# Patient Record
Sex: Female | Born: 1955
Health system: Southern US, Community
[De-identification: ages and names within clinical notes are randomized; demographics above are authoritative.]

## PROBLEM LIST (undated history)

## (undated) DIAGNOSIS — I495 Sick sinus syndrome: Secondary | ICD-10-CM

## (undated) DIAGNOSIS — R945 Abnormal results of liver function studies: Secondary | ICD-10-CM

## (undated) DIAGNOSIS — I480 Paroxysmal atrial fibrillation: Secondary | ICD-10-CM

## (undated) DIAGNOSIS — F329 Major depressive disorder, single episode, unspecified: Secondary | ICD-10-CM

## (undated) DIAGNOSIS — I219 Acute myocardial infarction, unspecified: Secondary | ICD-10-CM

## (undated) DIAGNOSIS — D649 Anemia, unspecified: Secondary | ICD-10-CM

## (undated) DIAGNOSIS — M069 Rheumatoid arthritis, unspecified: Secondary | ICD-10-CM

## (undated) DIAGNOSIS — I639 Cerebral infarction, unspecified: Secondary | ICD-10-CM

## (undated) DIAGNOSIS — R519 Headache, unspecified: Secondary | ICD-10-CM

## (undated) DIAGNOSIS — I739 Peripheral vascular disease, unspecified: Secondary | ICD-10-CM

## (undated) DIAGNOSIS — Z972 Presence of dental prosthetic device (complete) (partial): Secondary | ICD-10-CM

## (undated) DIAGNOSIS — Z973 Presence of spectacles and contact lenses: Secondary | ICD-10-CM

## (undated) DIAGNOSIS — R42 Dizziness and giddiness: Secondary | ICD-10-CM

## (undated) DIAGNOSIS — Z950 Presence of cardiac pacemaker: Secondary | ICD-10-CM

## (undated) DIAGNOSIS — M3500 Sicca syndrome, unspecified: Secondary | ICD-10-CM

## (undated) DIAGNOSIS — I2510 Atherosclerotic heart disease of native coronary artery without angina pectoris: Secondary | ICD-10-CM

## (undated) DIAGNOSIS — I5181 Takotsubo syndrome: Secondary | ICD-10-CM

## (undated) DIAGNOSIS — G935 Compression of brain: Secondary | ICD-10-CM

## (undated) DIAGNOSIS — I10 Essential (primary) hypertension: Secondary | ICD-10-CM

## (undated) DIAGNOSIS — M329 Systemic lupus erythematosus, unspecified: Secondary | ICD-10-CM

## (undated) DIAGNOSIS — F3289 Other specified depressive episodes: Secondary | ICD-10-CM

## (undated) DIAGNOSIS — E785 Hyperlipidemia, unspecified: Secondary | ICD-10-CM

## (undated) DIAGNOSIS — K219 Gastro-esophageal reflux disease without esophagitis: Secondary | ICD-10-CM

## (undated) DIAGNOSIS — F419 Anxiety disorder, unspecified: Secondary | ICD-10-CM

## (undated) DIAGNOSIS — R7989 Other specified abnormal findings of blood chemistry: Secondary | ICD-10-CM

## (undated) HISTORY — DX: Compression of brain: G93.5

## (undated) HISTORY — DX: Rheumatoid arthritis, unspecified: M06.9

## (undated) HISTORY — DX: Dizziness and giddiness: R42

## (undated) HISTORY — DX: Gastro-esophageal reflux disease without esophagitis: K21.9

## (undated) HISTORY — PX: TUBAL LIGATION: SHX77

## (undated) HISTORY — DX: Other specified depressive episodes: F32.89

## (undated) HISTORY — DX: Atherosclerotic heart disease of native coronary artery without angina pectoris: I25.10

## (undated) HISTORY — DX: Essential (primary) hypertension: I10

## (undated) HISTORY — PX: COLONOSCOPY: SHX174

## (undated) HISTORY — DX: Major depressive disorder, single episode, unspecified: F32.9

## (undated) HISTORY — DX: Systemic lupus erythematosus, unspecified: M32.9

## (undated) HISTORY — DX: Takotsubo syndrome: I51.81

## (undated) HISTORY — DX: Peripheral vascular disease, unspecified: I73.9

## (undated) HISTORY — DX: Sicca syndrome, unspecified: M35.00

## (undated) HISTORY — DX: Hyperlipidemia, unspecified: E78.5

## (undated) SURGERY — Surgical Case
Anesthesia: *Unknown

---
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
## 1998-03-31 ENCOUNTER — Ambulatory Visit: Admission: RE | Admit: 1998-03-31 | Discharge: 1998-03-31 | Payer: Self-pay | Admitting: Family Medicine

## 1998-04-06 ENCOUNTER — Encounter: Admission: RE | Admit: 1998-04-06 | Discharge: 1998-07-05 | Payer: Self-pay | Admitting: Family Medicine

## 1999-12-14 ENCOUNTER — Emergency Department (HOSPITAL_COMMUNITY): Admission: EM | Admit: 1999-12-14 | Discharge: 1999-12-14 | Payer: Self-pay | Admitting: Emergency Medicine

## 1999-12-14 ENCOUNTER — Encounter: Payer: Self-pay | Admitting: Emergency Medicine

## 2002-01-29 ENCOUNTER — Ambulatory Visit (HOSPITAL_COMMUNITY): Admission: RE | Admit: 2002-01-29 | Discharge: 2002-01-29 | Payer: Self-pay

## 2002-03-18 ENCOUNTER — Emergency Department (HOSPITAL_COMMUNITY): Admission: EM | Admit: 2002-03-18 | Discharge: 2002-03-18 | Payer: Self-pay | Admitting: Emergency Medicine

## 2002-03-18 ENCOUNTER — Encounter: Payer: Self-pay | Admitting: Emergency Medicine

## 2002-05-27 ENCOUNTER — Ambulatory Visit (HOSPITAL_COMMUNITY): Admission: RE | Admit: 2002-05-27 | Discharge: 2002-05-27 | Payer: Self-pay | Admitting: Family Medicine

## 2002-05-27 ENCOUNTER — Encounter: Payer: Self-pay | Admitting: Family Medicine

## 2003-02-12 ENCOUNTER — Emergency Department (HOSPITAL_COMMUNITY): Admission: EM | Admit: 2003-02-12 | Discharge: 2003-02-12 | Payer: Self-pay | Admitting: Emergency Medicine

## 2003-05-26 ENCOUNTER — Ambulatory Visit (HOSPITAL_COMMUNITY): Admission: RE | Admit: 2003-05-26 | Discharge: 2003-05-26 | Payer: Self-pay | Admitting: Obstetrics

## 2003-05-26 ENCOUNTER — Encounter: Payer: Self-pay | Admitting: Obstetrics

## 2003-11-30 ENCOUNTER — Emergency Department (HOSPITAL_COMMUNITY): Admission: EM | Admit: 2003-11-30 | Discharge: 2003-11-30 | Payer: Self-pay

## 2003-12-01 ENCOUNTER — Emergency Department (HOSPITAL_COMMUNITY): Admission: AD | Admit: 2003-12-01 | Discharge: 2003-12-01 | Payer: Self-pay | Admitting: Family Medicine

## 2003-12-01 ENCOUNTER — Emergency Department (HOSPITAL_COMMUNITY): Admission: EM | Admit: 2003-12-01 | Discharge: 2003-12-01 | Payer: Self-pay

## 2004-07-14 ENCOUNTER — Ambulatory Visit: Payer: Self-pay | Admitting: Internal Medicine

## 2005-12-25 ENCOUNTER — Emergency Department (HOSPITAL_COMMUNITY): Admission: EM | Admit: 2005-12-25 | Discharge: 2005-12-25 | Payer: Self-pay | Admitting: Family Medicine

## 2005-12-26 ENCOUNTER — Emergency Department (HOSPITAL_COMMUNITY): Admission: EM | Admit: 2005-12-26 | Discharge: 2005-12-26 | Payer: Self-pay | Admitting: Family Medicine

## 2008-12-18 ENCOUNTER — Encounter: Payer: Self-pay | Admitting: Physician Assistant

## 2009-07-28 DIAGNOSIS — I5181 Takotsubo syndrome: Secondary | ICD-10-CM

## 2009-07-28 HISTORY — DX: Takotsubo syndrome: I51.81

## 2009-08-16 ENCOUNTER — Ambulatory Visit: Payer: Self-pay | Admitting: Cardiovascular Disease

## 2009-08-16 ENCOUNTER — Inpatient Hospital Stay (HOSPITAL_COMMUNITY): Admission: EM | Admit: 2009-08-16 | Discharge: 2009-08-18 | Payer: Self-pay | Admitting: Emergency Medicine

## 2009-08-16 ENCOUNTER — Encounter: Payer: Self-pay | Admitting: Cardiology

## 2009-08-17 LAB — CONVERTED CEMR LAB
Hemoglobin: 12.8 g/dL
TSH: 0.927 microintl units/mL

## 2009-08-26 ENCOUNTER — Ambulatory Visit: Payer: Self-pay | Admitting: Physician Assistant

## 2009-08-26 DIAGNOSIS — I2510 Atherosclerotic heart disease of native coronary artery without angina pectoris: Secondary | ICD-10-CM | POA: Insufficient documentation

## 2009-08-26 DIAGNOSIS — E1169 Type 2 diabetes mellitus with other specified complication: Secondary | ICD-10-CM | POA: Insufficient documentation

## 2009-08-26 DIAGNOSIS — I429 Cardiomyopathy, unspecified: Secondary | ICD-10-CM

## 2009-08-26 DIAGNOSIS — M069 Rheumatoid arthritis, unspecified: Secondary | ICD-10-CM | POA: Insufficient documentation

## 2009-08-26 DIAGNOSIS — E785 Hyperlipidemia, unspecified: Secondary | ICD-10-CM

## 2009-08-26 DIAGNOSIS — I10 Essential (primary) hypertension: Secondary | ICD-10-CM

## 2009-08-26 DIAGNOSIS — I152 Hypertension secondary to endocrine disorders: Secondary | ICD-10-CM | POA: Insufficient documentation

## 2009-08-26 DIAGNOSIS — K219 Gastro-esophageal reflux disease without esophagitis: Secondary | ICD-10-CM

## 2009-08-31 ENCOUNTER — Encounter: Payer: Self-pay | Admitting: Physician Assistant

## 2009-08-31 LAB — CONVERTED CEMR LAB
ALT: 17 units/L (ref 0–35)
Albumin: 4.3 g/dL (ref 3.5–5.2)
Amphetamine Screen, Ur: NEGATIVE
Benzodiazepines.: NEGATIVE
Cocaine Metabolites: NEGATIVE
Creatinine, Ser: 0.9 mg/dL (ref 0.40–1.20)
Marijuana Metabolite: NEGATIVE
Methadone: NEGATIVE
Opiate Screen, Urine: NEGATIVE
Phencyclidine (PCP): NEGATIVE
Potassium: 5.2 meq/L (ref 3.5–5.3)
Propoxyphene: NEGATIVE
Sodium: 142 meq/L (ref 135–145)

## 2009-09-09 ENCOUNTER — Ambulatory Visit: Payer: Self-pay | Admitting: Physician Assistant

## 2009-09-10 ENCOUNTER — Encounter: Payer: Self-pay | Admitting: Physician Assistant

## 2009-09-10 LAB — CONVERTED CEMR LAB
Creatinine, Ser: 1.11 mg/dL (ref 0.40–1.20)
Sed Rate: 2 mm/hr (ref 0–22)
Sodium: 142 meq/L (ref 135–145)

## 2009-09-17 ENCOUNTER — Ambulatory Visit: Payer: Self-pay | Admitting: Cardiology

## 2009-09-17 DIAGNOSIS — F17200 Nicotine dependence, unspecified, uncomplicated: Secondary | ICD-10-CM | POA: Insufficient documentation

## 2009-09-20 ENCOUNTER — Encounter: Payer: Self-pay | Admitting: Physician Assistant

## 2009-09-22 ENCOUNTER — Telehealth (INDEPENDENT_AMBULATORY_CARE_PROVIDER_SITE_OTHER): Payer: Self-pay | Admitting: *Deleted

## 2009-10-11 ENCOUNTER — Ambulatory Visit: Payer: Self-pay | Admitting: Physician Assistant

## 2009-10-12 LAB — CONVERTED CEMR LAB
Albumin: 4.4 g/dL (ref 3.5–5.2)
BUN: 11 mg/dL (ref 6–23)
CO2: 24 meq/L (ref 19–32)
Glucose, Bld: 75 mg/dL (ref 70–99)
Potassium: 4.4 meq/L (ref 3.5–5.3)
Sodium: 141 meq/L (ref 135–145)
Total Bilirubin: 0.4 mg/dL (ref 0.3–1.2)

## 2009-10-13 ENCOUNTER — Ambulatory Visit: Payer: Self-pay

## 2009-10-13 ENCOUNTER — Ambulatory Visit: Payer: Self-pay | Admitting: Cardiology

## 2009-10-13 ENCOUNTER — Encounter: Payer: Self-pay | Admitting: Physician Assistant

## 2009-10-13 ENCOUNTER — Ambulatory Visit (HOSPITAL_COMMUNITY): Admission: RE | Admit: 2009-10-13 | Discharge: 2009-10-13 | Payer: Self-pay | Admitting: Cardiology

## 2009-10-14 LAB — CONVERTED CEMR LAB
ALT: 33 units/L (ref 0–35)
Alkaline Phosphatase: 60 units/L (ref 39–117)
Chloride: 107 meq/L (ref 96–112)
HDL: 84.3 mg/dL (ref >39.00)
LDL Cholesterol: 55 mg/dL (ref 0–99)
Sodium: 141 meq/L (ref 135–145)
Total Bilirubin: 0.5 mg/dL (ref 0.3–1.2)
Total Protein: 6.5 g/dL (ref 6.0–8.3)

## 2009-10-18 ENCOUNTER — Ambulatory Visit: Payer: Self-pay | Admitting: Cardiology

## 2009-10-18 DIAGNOSIS — E876 Hypokalemia: Secondary | ICD-10-CM

## 2009-10-19 ENCOUNTER — Encounter (INDEPENDENT_AMBULATORY_CARE_PROVIDER_SITE_OTHER): Payer: Self-pay | Admitting: Internal Medicine

## 2009-10-19 ENCOUNTER — Ambulatory Visit (HOSPITAL_COMMUNITY): Admission: RE | Admit: 2009-10-19 | Discharge: 2009-10-19 | Payer: Self-pay | Admitting: Internal Medicine

## 2009-10-19 ENCOUNTER — Encounter: Payer: Self-pay | Admitting: Physician Assistant

## 2009-10-19 ENCOUNTER — Ambulatory Visit: Payer: Self-pay | Admitting: Surgery

## 2009-10-24 ENCOUNTER — Encounter: Payer: Self-pay | Admitting: Physician Assistant

## 2009-10-29 DIAGNOSIS — I739 Peripheral vascular disease, unspecified: Secondary | ICD-10-CM

## 2009-11-02 ENCOUNTER — Ambulatory Visit: Payer: Self-pay | Admitting: Physician Assistant

## 2009-11-02 DIAGNOSIS — F329 Major depressive disorder, single episode, unspecified: Secondary | ICD-10-CM

## 2009-11-03 ENCOUNTER — Encounter: Payer: Self-pay | Admitting: Physician Assistant

## 2009-11-03 LAB — CONVERTED CEMR LAB
BUN: 13 mg/dL (ref 6–23)
CO2: 26 meq/L (ref 19–32)
Creatinine, Ser: 0.99 mg/dL (ref 0.40–1.20)
Glucose, Bld: 63 mg/dL — ABNORMAL LOW (ref 70–99)

## 2009-11-19 ENCOUNTER — Ambulatory Visit: Payer: Self-pay | Admitting: Physician Assistant

## 2009-11-19 ENCOUNTER — Telehealth: Payer: Self-pay | Admitting: Physician Assistant

## 2009-11-23 ENCOUNTER — Ambulatory Visit: Payer: Self-pay | Admitting: Cardiovascular Disease

## 2009-11-25 ENCOUNTER — Encounter: Payer: Self-pay | Admitting: Cardiovascular Disease

## 2009-12-10 ENCOUNTER — Ambulatory Visit: Payer: Self-pay | Admitting: Internal Medicine

## 2009-12-10 ENCOUNTER — Telehealth: Payer: Self-pay | Admitting: Cardiovascular Disease

## 2009-12-14 ENCOUNTER — Ambulatory Visit: Payer: Self-pay | Admitting: Physician Assistant

## 2009-12-21 ENCOUNTER — Ambulatory Visit: Payer: Self-pay | Admitting: Physician Assistant

## 2009-12-31 ENCOUNTER — Encounter: Payer: Self-pay | Admitting: Physician Assistant

## 2010-01-07 ENCOUNTER — Encounter: Payer: Self-pay | Admitting: Physician Assistant

## 2010-01-17 ENCOUNTER — Encounter: Payer: Self-pay | Admitting: Physician Assistant

## 2010-02-18 ENCOUNTER — Emergency Department (HOSPITAL_COMMUNITY): Admission: EM | Admit: 2010-02-18 | Discharge: 2010-02-18 | Payer: Self-pay | Admitting: Emergency Medicine

## 2010-02-21 ENCOUNTER — Ambulatory Visit: Payer: Self-pay | Admitting: Physician Assistant

## 2010-02-24 ENCOUNTER — Encounter (INDEPENDENT_AMBULATORY_CARE_PROVIDER_SITE_OTHER): Payer: Self-pay | Admitting: Internal Medicine

## 2010-02-24 ENCOUNTER — Ambulatory Visit (HOSPITAL_COMMUNITY): Admission: RE | Admit: 2010-02-24 | Discharge: 2010-02-24 | Payer: Self-pay | Admitting: Internal Medicine

## 2010-02-24 ENCOUNTER — Encounter: Payer: Self-pay | Admitting: Physician Assistant

## 2010-02-24 ENCOUNTER — Ambulatory Visit: Payer: Self-pay | Admitting: Vascular Surgery

## 2010-03-03 ENCOUNTER — Telehealth: Payer: Self-pay | Admitting: Physician Assistant

## 2010-03-07 ENCOUNTER — Encounter: Admission: RE | Admit: 2010-03-07 | Discharge: 2010-05-27 | Payer: Self-pay | Admitting: Internal Medicine

## 2010-03-07 ENCOUNTER — Encounter: Payer: Self-pay | Admitting: Physician Assistant

## 2010-03-09 ENCOUNTER — Ambulatory Visit: Payer: Self-pay | Admitting: Physician Assistant

## 2010-03-09 ENCOUNTER — Telehealth: Payer: Self-pay | Admitting: Physician Assistant

## 2010-03-09 DIAGNOSIS — I495 Sick sinus syndrome: Secondary | ICD-10-CM | POA: Insufficient documentation

## 2010-03-10 ENCOUNTER — Telehealth: Payer: Self-pay | Admitting: Physician Assistant

## 2010-03-16 ENCOUNTER — Ambulatory Visit: Payer: Self-pay | Admitting: Physician Assistant

## 2010-03-17 ENCOUNTER — Telehealth: Payer: Self-pay | Admitting: Physician Assistant

## 2010-03-17 ENCOUNTER — Encounter: Payer: Self-pay | Admitting: Physician Assistant

## 2010-03-17 ENCOUNTER — Ambulatory Visit (HOSPITAL_COMMUNITY): Admission: RE | Admit: 2010-03-17 | Discharge: 2010-03-17 | Payer: Self-pay | Admitting: Internal Medicine

## 2010-03-18 ENCOUNTER — Ambulatory Visit: Payer: Self-pay | Admitting: Cardiology

## 2010-03-22 ENCOUNTER — Telehealth: Payer: Self-pay | Admitting: Physician Assistant

## 2010-03-22 DIAGNOSIS — Q054 Unspecified spina bifida with hydrocephalus: Secondary | ICD-10-CM | POA: Insufficient documentation

## 2010-04-06 ENCOUNTER — Ambulatory Visit: Payer: Self-pay | Admitting: Physician Assistant

## 2010-04-06 LAB — CONVERTED CEMR LAB
BUN: 11 mg/dL (ref 6–23)
Glucose, Bld: 110 mg/dL — ABNORMAL HIGH (ref 70–99)
Potassium: 5.2 meq/L (ref 3.5–5.3)
Sodium: 141 meq/L (ref 135–145)

## 2010-04-07 ENCOUNTER — Encounter: Payer: Self-pay | Admitting: Physician Assistant

## 2010-05-03 ENCOUNTER — Telehealth (INDEPENDENT_AMBULATORY_CARE_PROVIDER_SITE_OTHER): Payer: Self-pay | Admitting: Cardiology

## 2010-05-27 ENCOUNTER — Telehealth: Payer: Self-pay | Admitting: Physician Assistant

## 2010-06-08 ENCOUNTER — Ambulatory Visit: Payer: Self-pay | Admitting: Physician Assistant

## 2010-06-08 LAB — CONVERTED CEMR LAB: Rapid Strep: NEGATIVE

## 2010-09-18 ENCOUNTER — Encounter: Payer: Self-pay | Admitting: Internal Medicine

## 2010-09-19 ENCOUNTER — Encounter: Payer: Self-pay | Admitting: Internal Medicine

## 2010-09-27 NOTE — Progress Notes (Signed)
Summary: Peripheral Vascular Angiogram procedure  Phone Note Call from Patient Call back at Home Phone [phone]   Caller: Patient Reason for Call: Talk to Nurse Summary of Call: pt having Peripheral Vascular Angiogram, does not have anyone to stay with her after procedure, can she stay in the hospital? Initial call taken by: Migdalia Dk,  December 10, 2009 4:02 PM  Follow-up for Phone Call        Spoke with Mercedes [name] about concerns over upcoming pv angiogram.  She does not have anyone to drive her to the hospital for her procedure or stay with her for 24 hours. Her mother passed on Nov 22, 2009 and she said her family has no time off at this time to take her or stay with her.  She lives alone.  I have told her [name] and Dennie Bible will be in the office Tuesday 12/14/09 and be able to make recommendations then about this concern. She agrees and will still come in Monday 12/13/09 for her lab work. Follow-up by: Lisabeth Devoid RN,  December 10, 2009 4:25 PM     Appended Document: Peripheral Vascular Angiogram procedure Pat, Can we touch base with her. We can keep her overnight if she cannot get a ride or we could delay the procedure until a time when she has someone available to drive her home. I would not want her driving herself home even if we kept her overnight. cdm  Appended Document: Peripheral Vascular Angiogram procedure left message to call back  Appended Document: Peripheral Vascular Angiogram procedure left message on cell phone to call back(previous message left on home phone)  Appended Document: Peripheral Vascular Angiogram procedure Spoke with pt who states she has many family issues at present time and has no one who can drive her home from hospital. She would like to cancel PV procedure for tomorrow.  Cath lab and Dr. [name] notified.  Appended Document: Peripheral Vascular Angiogram procedure Spoke with pt. She has many questions regarding possible treatment options  and would like to keep appt. with Dr. [name] on May 12 to discuss.
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
## 2010-09-27 NOTE — Progress Notes (Signed)
Summary: MRI and CT scan test results//gk  Phone Note Call from Patient   Summary of Call: The pt went on Thursday to the [facility], and she  had done a CT scan and MRI test and  is wondering if the provider received the results, if so can someone can call her back?Alben Spittle PA-c Initial call taken by: Manon Hilding,  March 22, 2010 11:29 AM  Follow-up for Phone Call        forward to provider Follow-up by: Armenia Shannon,  March 22, 2010 12:01 PM  Additional Follow-up for Phone Call Additional follow up Details #1::        I spoke with patient  Additional Follow-up by: Brynda Rim,  March 22, 2010 5:52 PM

## 2010-09-27 NOTE — Letter (Signed)
Summary: *HSN Results Follow up  [facility]  [address] [place], [place] [address]   Phone: [phone]  Fax: [phone]      04/07/2010   Mercedes [name] [address] [place], [place]  [address]   Dear  [name],                            ____S.Drinkard,FNP   ____D. Gore,FNP       ____B. McPherson,MD   ____V. Rankins,MD    ____E. Mulberry,MD    ____N. Daphine Deutscher, FNP  ____D. Reche Dixon, MD    ____K. Philipp Deputy, MD    __x__S. Alben Spittle, PA-C     This letter is to inform you that your recent test(s):  _______Pap Smear    ___x____Lab Test     _______X-ray    ___x____ is within acceptable limits  _______ requires a medication change  _______ requires a follow-up lab visit  _______ requires a follow-up visit with your provider   Comments: Kidney function and potassium ok.       _________________________________________________________ If you have any questions, please contact our office                     Sincerely,  [name] PA-C [facility]

## 2010-09-27 NOTE — Miscellaneous (Signed)
  Clinical Lists Changes  Observations: Added new observation of PAST MED HX: Current Problems:  HYPERTENSION (ICD-401.9) HYPERLIPIDEMIA (ICD-272.4) Takotsubo cardiomyopathy CORONARY ATHEROSCLEROSIS NATIVE CORONARY ARTERY (ICD-414.01) CAD   a.  NSTEMI 07/2009: min. Nonobs CAD (LAD irreg; 40% ostial D2 and no CFX or RCA disease)   b.  EF 50% by cath with apical AK   c.  Tako-Tsubo CM   d.  f/u Echo 09/2009: EF 50-55%; mild LVH; mod diast. dysfxn; mild apical HK RHEUMATOID ARTHRITIS (ICD-714.0) a.  previously with Dr. [name]  GERD (ICD-530.81) Postmenopausal  (10/24/2009 21:54)       Past History:  Past Medical History: Current Problems:  HYPERTENSION (ICD-401.9) HYPERLIPIDEMIA (ICD-272.4) Takotsubo cardiomyopathy CORONARY ATHEROSCLEROSIS NATIVE CORONARY ARTERY (ICD-414.01) CAD   a.  NSTEMI 07/2009: min. Nonobs CAD (LAD irreg; 40% ostial D2 and no CFX or RCA disease)   b.  EF 50% by cath with apical AK   c.  Tako-Tsubo CM   d.  f/u Echo 09/2009: EF 50-55%; mild LVH; mod diast. dysfxn; mild apical HK RHEUMATOID ARTHRITIS (ICD-714.0) a.  previously with Dr. [name]  GERD (ICD-530.81) Postmenopausal

## 2010-09-27 NOTE — Assessment & Plan Note (Signed)
Summary: post cath per chris b/lg/ insurance is pending / gd   CC:  pt says she stopped her benicar due to payment.  History of Present Illness: Mercedes [name] recently admitted to [facility] with complaints of chest pain. Her enzymes were positive. A cardiac catheterization was performed on August 16, 2009. There was a 40% LAD but no obstructive disease was noted. The ejection fraction was 50% and there is apical ballooning. This was felt consistent with takotsubo cardiomyopathy. Note the renal arteries were patent. Since discharge she has dyspnea with more extreme activities but not with routine activities. It is relieved with rest. There is no associated chest pain. There is no orthopnea, PND, pedal edema, palpitations, syncope or exertional chest pain.  Current Medications (verified): 1)  Carvedilol 6.25 Mg Tabs (Carvedilol) .... Take 1 Tablet By Mouth Two Times A Day 2)  Simvastatin 40 Mg Tabs (Simvastatin) .... One Tab By Mouth Everyday 3)  Cvs Aspirin 325 Mg Tabs (Aspirin) .... One Daily 4)  Hydrochlorothiazide 25 Mg Tabs (Hydrochlorothiazide) .... 1/2 Tab By Mouth Once Daily  Allergies: No Known Drug Allergies  Past History:  Past Medical History: Current Problems:  HYPERTENSION (ICD-401.9) HYPERLIPIDEMIA (ICD-272.4) Takotsubo cardiomyopathy CORONARY ATHEROSCLEROSIS NATIVE CORONARY ARTERY (ICD-414.01) CAD   a.  NSTEMI 07/2009: min. Nonobs CAD (LAD irreg; 40% ostial D2 and no CFX or RCA disease)   b.  EF 50% by cath with apical AK   c.  Tako-Tsubo CM RHEUMATOID ARTHRITIS (ICD-714.0) a.  previously with Dr. [name]  GERD (ICD-530.81) Postmenopausal  Social History: Reviewed history from 08/26/2009 and no changes required. Occupation: unemployed; housekeeping Single 3 kids Patient is smoking 3 cigarettes per day. Alcohol use-yes Drug use-yes  Review of Systems       no fevers or chills, productive cough, hemoptysis, dysphasia, odynophagia, melena, hematochezia,  dysuria, hematuria, rash, seizure activity, orthopnea, PND, pedal edema, claudication. Remaining systems are negative.   Vital Signs:  Patient profile:   55 year old [name] Menstrual status:  postmenopausal Height:      66 inches Weight:      123 pounds BMI:     19.92 Pulse rate:   62 / minute Resp:     18 per minute BP sitting:   170 / 82  (left arm)  Vitals Entered By: [name] (September 17, 2009 3:15 PM)  Physical Exam  General:  Well-developed well-nourished in no acute distress.  Skin is warm and dry.  HEENT is normal.  Neck is supple. No thyromegaly.  Chest is clear to auscultation with normal expansion.  Cardiovascular exam is regular rate and rhythm.  Abdominal exam nontender or distended. No masses palpated. Extremities show no edema. neuro grossly intact    EKG  Procedure date:  09/17/2009  Findings:      Sinus rhythm at a rate of 62. Left anterior fascicular block. Inferior and anterior T-wave changes.  Impression & Recommendations:  Problem # 1:  TOBACCO ABUSE (ICD-305.1) Patient counseled on discontinuing for between 3-10 minutes.  Problem # 2:  HYPERTENSION (ICD-401.9) Blood pressure elevated. Increase Coreg to 12.5 mg p.o. b.i.d. The following medications were removed from the medication list:    Benicar 40 Mg Tabs (Olmesartan medoxomil) ..... Once daily Her updated medication list for this problem includes:    Carvedilol 12.5 Mg Tabs (Carvedilol) .[name]... Take one tablet by mouth twice a day    Cvs Aspirin 325 Mg Tabs (Aspirin) ..... One daily    Hydrochlorothiazide 25 Mg Tabs (Hydrochlorothiazide) .Marland KitchenMarland KitchenMarland KitchenMarland Kitchen  1/2 tab by mouth once daily  Problem # 3:  HYPERLIPIDEMIA (ICD-272.4) Continue statin. Check lipids and liver. Given diuretic use we'll also check bmet. Her updated medication list for this problem includes:    Simvastatin 40 Mg Tabs (Simvastatin) ..... One tab by mouth everyday  Problem # 4:  CARDIOMYOPATHY, SECONDARY (ICD-425.9) Patient  with takotsubo cardiomyopathy; repeat echo in 4 weeks to see if LV function has normalized. The following medications were removed from the medication list:    Benicar 40 Mg Tabs (Olmesartan medoxomil) ..... Once daily Her updated medication list for this problem includes:    Carvedilol 12.5 Mg Tabs (Carvedilol) .[name]... Take one tablet by mouth twice a day    Cvs Aspirin 325 Mg Tabs (Aspirin) ..... One daily    Hydrochlorothiazide 25 Mg Tabs (Hydrochlorothiazide) .[name]... 1/2 tab by mouth once daily  Orders: Echocardiogram (Echo)  Problem # 5:  CORONARY ATHEROSCLEROSIS NATIVE CORONARY ARTERY (ICD-414.01) Minor nonobstructive disease on recent catheterization. Continue aspirin, beta blocker and statin. Her updated medication list for this problem includes:    Carvedilol 12.5 Mg Tabs (Carvedilol) .[name]... Take one tablet by mouth twice a day    Cvs Aspirin 325 Mg Tabs (Aspirin) ..... One daily  Problem # 6:  RHEUMATOID ARTHRITIS (ICD-714.0)  Problem # 7:  GERD (ICD-530.81)  Patient Instructions: 1)  Your physician recommends that you schedule a follow-up appointment in: 6 MONTHS 2)  Your physician recommends that you return for lab work in:4 WEEKS-BMP/LIPID/LIVER-272.0/V58.69/401.1 3)  Your physician has recommended you make the following change in your medication: INCREASE CARVEDALOL 12.5MG  ONE TABLET TWICE DAILY 4)  Your physician has requested that you have an echocardiogram.  Echocardiography is a painless test that uses sound waves to create images of your heart. It provides your doctor with information about the size and shape of your heart and how well your heart's chambers and valves are working.  This procedure takes approximately one hour. There are no restrictions for this procedure.SCHEDULE IN 4 WEEKS Prescriptions: CARVEDILOL 12.5 MG TABS (CARVEDILOL) Take one tablet by mouth twice a day  #60 x 12   Entered by:   Deliah Goody, RN   Authorized by:   Ferman Hamming, MD, [facility]    Signed by:   Deliah Goody, RN on 09/17/2009   Method used:   Electronically to        [facility] [phone]* (retail)       [address]       [place], [place]  [address]       Ph: [phone]       Fax: [phone]   RxID:   [id number]

## 2010-09-27 NOTE — Assessment & Plan Note (Signed)
Summary: follow up in 6 weeks with Mercedes [name] for bp//gk   Vital Signs:  Patient profile:   55 year old female Menstrual status:  postmenopausal Height:      66 inches Weight:      130 pounds BMI:     21.06 Temp:     98.1 degrees F oral Pulse rate:   59 / minute Pulse rhythm:   regular Resp:     18 per minute BP sitting:   163 / 87  (left arm) Cuff size:   regular  Vitals Entered By: Armenia Shannon (October 11, 2009 10:35 AM) CC: f/u on bp.... Is Patient Diabetic? No Pain Assessment Patient in pain? no       Does patient need assistance? Functional Status Self care Ambulation Normal   CC:  f/u on bp.....  History of Present Illness: 55 year old female presents for followup on her blood pressure.  Since last being seen, she ran out of her hydrochlorothiazide.  She saw the cardiologist and he increased her carvedilol.  She is no longer on hydrochlorothiazide.  Her blood pressures remain elevated.  She continues to have significant symptoms of dyspnea with exertion.  She notes NYHA class II to class IIb symptoms.  She sleeps on one pillow.  She denies any true paroxysmal nocturnal dyspnea.  She is concerned about weight gain but has had no specific edema.  She denies any cough.  She continues to smoke cigarettes.  She is complaining of significant leg pain.  It seems to come on with exertion.  She also notes this at night.  She also notes shoulder pain.  She did not have any symptoms until she came out of the hospital.  Of note, simvastatin is a new medication for her.  When I last saw her, she noted that her rheumatoid arthritis was in remission.  I checked a sed rate.  This was normal.  She still denies any symptoms of synovitis in her hands.  However, she now notes that she has occasional pain in knees and  her shoulder.  She tells me that this was a symptom that she had in the past with her rheumatoid arthritis.  Problems Prior to Update: 1)  Dyspnea  (ICD-786.05) 2)  Leg Pain   (ICD-729.5) 3)  Tobacco Abuse  (ICD-305.1) 4)  Hypertension  (ICD-401.9) 5)  Hyperlipidemia  (ICD-272.4) 6)  Chest Pain  (ICD-786.50) 7)  Preventive Health Care  (ICD-V70.0) 8)  Cardiomyopathy, Secondary  (ICD-425.9) 9)  Coronary Atherosclerosis Native Coronary Artery  (ICD-414.01) 10)  Family History Diabetes 1st Degree Relative  (ICD-V18.0) 11)  Rheumatoid Arthritis  (ICD-714.0) 12)  Gerd  (ICD-530.81)  Current Medications (verified): 1)  Carvedilol 12.5 Mg Tabs (Carvedilol) .... Take One Tablet By Mouth Twice A Day 2)  Simvastatin 40 Mg Tabs (Simvastatin) .... One Tab By Mouth Everyday 3)  [facility]vs Aspirin 325 Mg Tabs (Aspirin) .... One Daily 4)  Hydrochlorothiazide 25 Mg Tabs (Hydrochlorothiazide) .... 1/2 Tab By Mouth Once Daily  Allergies (verified): No Known Drug Allergies  Past History:  Past Medical History: Last updated: 09/17/2009 Current Problems:  HYPERTENSION (ICD-401.9) HYPERLIPIDEMIA (ICD-272.4) Takotsubo cardiomyopathy CORONARY ATHEROSCLEROSIS NATIVE CORONARY ARTERY (ICD-414.01) CAD   a.  NSTEMI 07/2009: min. Nonobs CAD (LAD irreg; 40% ostial D2 and no CFX or RCA disease)   b.  EF 50% by cath with apical AK   c.  Tako-Tsubo CM RHEUMATOID ARTHRITIS (ICD-714.0) a.  previously with Dr. [name]  GERD (ICD-530.81) Postmenopausal  Physical Exam  General:  alert, well-developed, and well-nourished.   Head:  normocephalic and atraumatic.   Neck:  supple.   no jvd  Lungs:  normal breath sounds, no crackles, and no wheezes.   Heart:  normal rate and regular rhythm.   Abdomen:  soft, non-tender, and no hepatomegaly.   Msk:  no signs of synovitis bilat hands no pain with palp of bilat shoulders legs without pain with palp either Pulses:  diminished DP/PT bilat  Extremities:  no edema Neurologic:  alert & oriented X3 and cranial nerves II-XII intact.   Psych:  normally interactive.     Impression & Recommendations:  Problem # 1:  LEG PAIN (ICD-729.5)  ?  myalgias from statin . . . hold simvastatin check CMET, CPK diminished pulses . . . check ABIs f/u with me in 2 weeks . . . if improved, try changing to pravastatin or Crestor  Orders: T-Comprehensive Metabolic Panel [id number]) T-CK Total [id number]) LE Arterial Doppler/ABI (LE arterial doppler)  Problem # 2:  DYSPNEA (ICD-786.05)  ? deconditioning vs. chronic sys chf (doubt as her EF was 50 % and she has no signs of vol overload) vs. COPD (likely with long h/o cigs) check PFTs Echo is pending later this week with cardiology not sure she would be able to afford cardiac rehab advised her to increase activity slowly . . . advised her to walk d/c cigs  Orders: PFT Baseline-Pre/Post Bronchodiolator (PFT Baseline-Pre/Pos)  Problem # 3:  HYPERTENSION (ICD-401.9)  uncontrolled  she ran out of HCTZ and never go refilled refill HCTZ  f/u bp in 2 weeks with me  Her updated medication list for this problem includes:    Carvedilol 12.5 Mg Tabs (Carvedilol) .[name]... Take one tablet by mouth twice a day    Hydrochlorothiazide 25 Mg Tabs (Hydrochlorothiazide) .[name]... 1/2 tab by mouth once daily    Benicar 20 Mg Tabs (Olmesartan medoxomil) .[name]... Take 1 tablet by mouth once a day  Orders: T-Comprehensive Metabolic Panel ([id number])  Problem # 4:  HYPERLIPIDEMIA (ICD-272.4)  hold statin as noted above  Her updated medication list for this problem includes:    Simvastatin 40 Mg Tabs (Simvastatin) ..... One tab by mouth everyday  Orders: T-Comprehensive Metabolic Panel [phone]) T-CK Total [id number])  Problem # 5:  RHEUMATOID ARTHRITIS (ICD-714.0)  symptoms somewhat confusing she noted to me last visit that she had no problems with her RA her ESR was normal now, she notes that she has occ. pain in her knees and shoulders I would feel better if she were evaluated further by a rheumatologist will refer to rheumatology  Her updated medication list for this problem  includes:    Cvs Aspirin 325 Mg Tabs (Aspirin) ..... One daily  Orders: Rheumatology Referral (Rheumatology)  Problem # 6:  TOBACCO ABUSE (ICD-305.1) advised to quit  Orders: PFT Baseline-Pre/Post Bronchodiolator (PFT Baseline-Pre/Pos)  Complete Medication List: 1)  Carvedilol 12.5 Mg Tabs (Carvedilol) .... Take one tablet by mouth twice a day 2)  Simvastatin 40 Mg Tabs (Simvastatin) .... One tab by mouth everyday 3)  Cvs Aspirin 325 Mg Tabs (Aspirin) .... One daily 4)  Hydrochlorothiazide 25 Mg Tabs (Hydrochlorothiazide) .... 1/2 tab by mouth once daily 5)  Benicar 20 Mg Tabs (Olmesartan medoxomil) .... Take 1 tablet by mouth once a day  Patient Instructions: 1)  Please schedule a follow-up appointment in 2 weeks with Awa Bachicha for leg pain and blood pressure. 2)  Restart your HCTZ and take every day. 3)  Stop taking simvastatin for now.  We will see if this helps your leg pain.  Do not throw it away. 4)  We will send your prescriptions to the pharmacy at [name].  Bring last years tax return with you or your last several pay stubs.  They will have to enroll you in  a program to continue getting some of your medicines.  5)  Tobacco is very bad for your health and your loved ones ! You should stop smoking !  6)  Stop smoking tips: Choose a quit date. Cut down before the quit date. Decide what you will do as a substitute when you feel the urge to smoke(gum, toothpick, exercise).  Prescriptions: BENICAR 20 MG TABS (OLMESARTAN MEDOXOMIL) Take 1 tablet by mouth once a day  #30 x 5   Entered and Authorized by:   [name] PA-C   Signed by:   [name] PA-C on 10/11/2009   Method used:   Faxed to ...       [facility] (retail)       [address] [place], [place]  [address]       Ph: [phone]       Fax: [phone]   RxID:   [id number] HYDROCHLOROTHIAZIDE 25 MG TABS (HYDROCHLOROTHIAZIDE) 1/2 tab by mouth once daily  #15 x 5    Entered and Authorized by:   [name] PA-C   Signed by:   [name] PA-C on 10/11/2009   Method used:   Faxed to ...       [facility] - [facility] (retail)       [address] [place], [place]  [address]       Ph: [phone]       Fax: [phone]   RxID:   [id number] [facility] ASPIRIN 325 MG TABS (ASPIRIN) one daily  #30 x 11   Entered and Authorized by:   [name] PA-C   Signed by:   [name] PA-C on 10/11/2009   Method used:   Faxed to ...       [facility] - [facility] (retail)       [address] [place], [place]  [address]       Ph: [phone]       Fax: [phone]   RxID:   [id number] CARVEDILOL 12.5 MG TABS (CARVEDILOL) Take one tablet by mouth twice a day  #60 x 6   Entered and Authorized by:   [name] PA-C   Signed by:   [name] PA-C on 10/11/2009   Method used:   Faxed to ...       [facility] - [facility] (retail)       [address] [place], [place]  [address]       Ph: [phone]       Fax: [phone]   RxID:   [id number]

## 2010-09-27 NOTE — Progress Notes (Signed)
  Request form DDS,forwarded to [facility].Cala Bradford Mesiemore  September 22, 2009 11:37 AM

## 2010-09-27 NOTE — Assessment & Plan Note (Signed)
Summary: npv/PVD/jml   Visit Type:  Initial Consult Primary Provider:  [name] PA-C  CC:  pt states bilateral blockages in legs...states right leg more sore than the left....fatigue..pt states her weight has never been as high as today (130)....  History of Present Illness: 55 yo AAF with history of PVD diagnosed by arterial dopplers 2/11, HTN, hyperlipidemia, non-osbtructive CAD by cath 12/10 here today as a new PV consult. She tells that she has been having pain in both legs with walking. She can walk 300 feet before she has the onset of pain. The pain is cramping in both legs but is worse in the right leg. Mostly resoves with rest. Her biltateral thighs also ache when she elevates her feet. Her right leg also hurts while laying in bed some nights. No ulcerations on her feet or toes. Arterial dopplers at [facility] 10/19/09 with right ABI of 0.67, left 0.83. Suggestion of disease in bilateral SFA and possible tibial disease. She continues to smoke 2-3 cigarettes per day. Admission 12/10 with CP, NSTEMI and thought to have Takotsubos Cardiomyopathy, coronaries with mild disease but apical wall motion abnormality.   Current Medications (verified): 1)  Carvedilol 12.5 Mg Tabs (Carvedilol) .... Take One Tablet By Mouth Twice A Day 2)  Cvs Aspirin 325 Mg Tabs (Aspirin) .... One Daily 3)  Benicar Hct 20-12.5 Mg Tabs (Olmesartan Medoxomil-Hctz) .[name].. 1 Tab Once Daily 4)  Proventil Hfa 108 (90 Base) Mcg/act Aers (Albuterol Sulfate) .[name].. 1-2 Puffs Every 4-6 Hours As Needed 5)  Celexa 10 Mg Tabs (Citalopram Hydrobromide) .... Take 1 Tablet By Mouth Once A Day  Allergies (verified): No Known Drug Allergies  Past History:  Past Medical History: Current Problems:  HYPERTENSION (ICD-401.9) HYPERLIPIDEMIA (ICD-272.4) Takotsubo cardiomyopathy CORONARY ATHEROSCLEROSIS NATIVE CORONARY ARTERY (ICD-414.01) CAD   a.  NSTEMI 07/2009: min. Nonobs CAD (LAD irreg; 40% ostial D2 and no CFX or RCA disease)   b.  EF 50% by cath with apical AK   c.  Tako-Tsubo CM   d.  f/u Echo 09/2009: EF 50-55%; mild LVH; mod diast. dysfxn; mild apical HK RHEUMATOID ARTHRITIS (ICD-714.0) a.  previously with Dr. [name] PVD-reduced ABI 2/11.  GERD (ICD-530.81) Postmenopausal  Past Surgical History: None  Family History: CAD - father with MI 49s, deceased. Family History Diabetes 1st degree relative - dad Family History Hypertension - dad Emphysema - mom, deceased 12-04-2022.  Social History: Occupation: unemployed; housekeeping Single 3 kids Patient is smoking 2-3 cigarettes per day. Alcohol use-none Drug use-none  Review of Systems  The patient denies fatigue, malaise, fever, weight gain/loss, vision loss, decreased hearing, hoarseness, chest pain, palpitations, shortness of breath, prolonged cough, wheezing, sleep apnea, coughing up blood, abdominal pain, blood in stool, nausea, vomiting, diarrhea, heartburn, incontinence, blood in urine, muscle weakness, joint pain, leg swelling, rash, skin lesions, headache, fainting, dizziness, depression, anxiety, enlarged lymph nodes, easy bruising or bleeding, and environmental allergies.         See HPI, bilateral leg pain with ambulation.  Vital Signs:  Patient profile:   55 year old female Menstrual status:  postmenopausal Height:      66 inches Weight:      130 pounds BMI:     21.06 Pulse rate:   56 / minute Pulse rhythm:   irregular BP sitting:   122 / 70  (left arm) Cuff size:   large  Vitals Entered By: Danielle Rankin, CMA (November 23, 2009 2:27 PM)  Physical Exam  General:  General: Well developed,  well nourished, NAD HEENT: OP clear, mucus membranes moist SKIN: warm, dry Neuro: No focal deficits Musculoskeletal: Muscle strength 5/5 all ext Psychiatric: Mood and affect normal Neck: No JVD, no carotid bruits, no thyromegaly, no lymphadenopathy. Lungs:Clear bilaterally, no wheezes, rhonci, crackles CV: RRR no murmurs, gallops rubs Abdomen: soft,  NT, ND, BS present Extremities: No edema, pulses difficult to palpate bilateral DP/ PT. Handheld doppler with monophasic signals in bilateral DP/PT.  No ulcerations. No skin changes.     Arterial Doppler  Procedure date:  10/19/2009  Findings:      ABI right 0.67, left 0.83.    Impression & Recommendations:  Problem # 1:  PERIPHERAL VASCULAR DISEASE (ICD-443.9) Her symptoms are c/w claudication and the arterial dopplers are suggestive of obstructive disease in both legs. She wishes to have a definitive diagnosis. I will arrange a distal aortogram with bilateral lower ext runoff. We will plan this for April 20th in the [facility] lab at [facility]. She will continue her current meds which includes ASA 325 mg per day. We will have her come by the office the week of procedure for BMET, CBC and coags. She has had a recent CXR. Risks and benefits of procedure reviewed. She agrees to proceed.   Problem # 2:  ATHEROSCLEROSIS W /INT CLAUDICATION (ICD-440.21)  Problem # 3:  TOBACCO ABUSE (ICD-305.1) Cessation encouraged.   Patient Instructions: 1)  Your physician recommends that you schedule a follow-up appointment in: 5-6 weeks 2)  Your physician recommends that you return for lab work on December 13, 2009 --BMP,CBC, PT, PTT-443.9: 3)  Your physician has requested that you have a peripheral vascular angiogram. This exam is performed at the hospital. During this exam IV contrast is used to look at arterial blood flow.  Please review the information sheet given for details.

## 2010-09-27 NOTE — Letter (Signed)
Summary: Peripheral Vascular  [facility], Main Office  [address]   [place], [place] [address]   Phone: [phone]  Fax: [phone]     11/23/2009 MRN: [id number]  [facility] [address] [place], [place]  [address]  Dear Ms. Beazer,   You are scheduled for Peripheral Vascular Angiogram on December 15, 2009              with Dr. [name]            .  Please arrive at the [facility] at 9:30      a.m.[name] on the day of your procedure.  1. DIET     __X__ Nothing to eat or drink after midnight except your medications with a sip of water.  2. Come to the [place] office on   December 13, 2009          for lab work.  The lab at [facility] is open from 8:30 a.m. to 1:30 p.m. and 2:30 p.m. to 5:00 p.m.  The lab at [facility] is open from 7:30 a.m. to 5:30 p.m.  You do not have to be fasting.  3. MAKE SURE YOU TAKE YOUR ASPIRIN.  4. _____ DO NOT TAKE these medications before your procedure:         ________________________________________________________________________________      __X__ YOU MAY TAKE ALL of your remaining medications with a small amount of water.      ____ START NEW medications:     ________________________________________________________________________________      ____ Eilene Ghazi instructions:     ________________________________________________________________________________  5. Plan for one night stay - bring personal belongings (i.e. toothpaste, toothbrush, etc.)  6. Bring a current list of your medications and current insurance cards.  7. Must have a responsible person to drive you home.   8. Someone must be with yu for the first 24 hours after you arrive home.  9. Please wear clothes that are easy to get on and off and wear slip-on shoes.  *Special note: Every effort is made to have your procedure done on time.  Occasionally there are emergencies that present themselves at  the hospital that may cause delays.  Please be patient if a delay does occur.  If you have any questions after you get home, please call the office at the number listed above.   Dossie Arbour, RN, BSN

## 2010-09-27 NOTE — Letter (Signed)
Summary: *HSN Results Follow up  [facility]  [address] [place], [place] [address]   Phone: [phone]  Fax: [phone]      09/10/2009   Jaiden L Barreiro [address] [place], [place]  [address]   Dear  [name],                            ____S.Drinkard,FNP   ____D. Gore,FNP       ____B. McPherson,MD   ____V. Rankins,MD    ____E. Mulberry,MD    ____N. Daphine Deutscher, FNP  ____D. Reche Dixon, MD    ____K. Philipp Deputy, MD    __x__S. Alben Spittle, PA-C     This letter is to inform you that your recent test(s):  _______Pap Smear    ___x____Lab Test     _______X-ray    ___x____ is within acceptable limits  _______ requires a medication change  _______ requires a follow-up lab visit  _______ requires a follow-up visit with your provider   Comments:         _________________________________________________________ If you have any questions, please contact our office                     Sincerely,  [name] PA-C [facility]

## 2010-09-27 NOTE — Assessment & Plan Note (Signed)
Summary: bp check and ekg/ tmm  Nurse Visit   Vital Signs:  Patient profile:   55 year old female Menstrual status:  postmenopausal Pulse rate:   54 / minute Pulse rhythm:   regular BP sitting:   113 / 74  (left arm) Cuff size:   regular  Vitals Entered By: Levon Hedger (March 16, 2010 2:28 PM) CC: BP check...pt states she did not take the Benicar today Comments Per Curly Mackowski, remove HCTZ from benicar and cut carvediol in half to 3.125 recheck EKG, BMP and BP in two weeks........... Tiffany McCoy CMA  March 16, 2010 3:46 PM    Allergies: No Known Drug Allergies  Orders Added: 1)  Est. Patient Level I [[address]] 2)  EKG w/ Interpretation [93000] Prescriptions: BENICAR 20 MG TABS (OLMESARTAN MEDOXOMIL) 1 by mouth once daily  #30 x 3   Entered by:   Vesta Mixer CMA   Authorized by:   [name] PA-C   Signed by:   Vesta Mixer CMA on 03/16/2010   Method used:   Faxed to ...       [facility] - [facility] (retail)       [address] [place], [place]  [address]       Ph: [phone]       Fax: [phone]   RxID:   [id number] COREG 3.125 MG TABS (CARVEDILOL) 1 by mouth two times a day  #30 x 3   Entered by:   Vesta Mixer CMA   Authorized by:   [name] PA-C   Signed by:   Vesta Mixer CMA on 03/16/2010   Method used:   Faxed to ...       [facility] - [facility] (retail)       [address] [place], [place]  [address]       Ph: [phone]       Fax: [phone]   RxID:   [id number]    Vital Signs:  Patient profile:   55 year old female Menstrual status:  postmenopausal Pulse rate:   54 / minute Pulse rhythm:   regular BP sitting:   113 / 74  (left arm) Cuff size:   regular  Vitals Entered By: Levon Hedger (March 16, 2010 2:28 PM)

## 2010-09-27 NOTE — Assessment & Plan Note (Signed)
Summary: 6 month rov   Primary Provider:  [name] PA-C  CC:  dizziness pt gets meds from [name] pt does not know meds.  History of Present Illness: Mercedes [name]  admitted to [facility] with complaints of chest pain in Dec 2010. Her enzymes were positive. A cardiac catheterization was performed on August 16, 2009. There was a 40% LAD but no obstructive disease was noted. The ejection fraction was 50% and there is apical ballooning. This was felt consistent with takotsubo cardiomyopathy. Note the renal arteries were patent.  Followup echocardiogram in February of 2011 revealed an ejection fraction of 50-55% with mild apical hypokinesis. There was mild mitral regurgitation. Carotid Dopplers in June of 2011 were negative. MRA in July of 2011 secondary to dizziness was also negative for carotid disease. She has been seen by [name] for abnormal ABIs and claudication. Peripheral arteriogram was scheduled but was canceled as her mother was ill. I last saw her in January of 2011. Since then, the patient denies any dyspnea on exertion, orthopnea, PND, pedal edema, palpitations, syncope or chest pain. She does continue to claudication.  Current Medications (verified): 1)  Coreg 3.125 Mg Tabs (Carvedilol) .[name].. 1 Tab By Mouth Two Times A Day 2)  Cvs Aspirin 325 Mg Tabs (Aspirin) .... One Daily 3)  Benicar 20 Mg Tabs (Olmesartan Medoxomil) .[name].. 1 By Mouth Once Daily 4)  Proventil Hfa 108 (90 Base) Mcg/act Aers (Albuterol Sulfate) .[name].. 1-2 Puffs Every 4-6 Hours As Needed 5)  Simvastatin 40 Mg Tabs (Simvastatin) .... Take 1 Tab By Mouth At Bedtime For Cholesterol 6)  Naproxen 500 Mg Tabs (Naproxen) .... Take 1 Tablet By Mouth Two Times A Day As Needed For Pain (Take With Food) 7)  Pepcid 20 Mg Tabs (Famotidine) .... Take 1 Tablet By Mouth Two Times A Day For 4 Weeks, Then Take Two Times A Day As Needed 8)  Meclizine Hcl 25 Mg Tabs (Meclizine Hcl) .... Take One By Mouth Every 8-12 Hours As  Needed For Dizziness  Allergies: No Known Drug Allergies  Past History:  Past Medical History: HYPERTENSION (ICD-401.9) HYPERLIPIDEMIA (ICD-272.4) Takotsubo cardiomyopathy CORONARY ATHEROSCLEROSIS NATIVE CORONARY ARTERY (ICD-414.01) CAD   a.  NSTEMI 07/2009: min. Nonobs CAD (LAD irreg; 40% ostial D2 and no CFX or RCA disease)   b.  EF 50% by cath with apical AK   c.  Tako-Tsubo CM   d.  f/u Echo 09/2009: EF 50-55%; mild LVH; mod diast. dysfxn; mild apical HK RHEUMATOID ARTHRITIS (ICD-714.0)    a.  previously with Dr. [name] PVD-reduced ABI 2/11. GERD (ICD-530.81) Postmenopausal Mechanical Back Pain   a.  previously followed by Dr. [name]   b.  last MRI done 2.2010 . . . ESI offered but pt. rejected; completed PT several times   c.  NCV normal; pt could not tolerate EMG ? CVA 01/2010 (dizziness)   a.  carotid dopplers with mild plaque; no significant ICA stenosis 02/24/2010  Past Surgical History: Reviewed history from 11/23/2009 and no changes required. None  Social History: Reviewed history from 11/23/2009 and no changes required. Occupation: unemployed; housekeeping Single 3 kids Patient is smoking 2-3 cigarettes per day. Alcohol use-none Drug use-none  Review of Systems       Some problems with dizziness and claudication but no fevers or chills, productive cough, hemoptysis, dysphasia, odynophagia, melena, hematochezia, dysuria, hematuria, rash, seizure activity, orthopnea, PND, pedal edema. Remaining systems are negative.   Vital Signs:  Patient profile:   54 year  old [name] Menstrual status:  postmenopausal Height:      66 inches Weight:      120 pounds BMI:     19.44 Pulse rate:   64 / minute Resp:     18 per minute BP sitting:   147 / 95  (left arm)  Vitals Entered By: [name] (March 18, 2010 8:33 AM)  Physical Exam  General:  Well-developed well-nourished in no acute distress.  Skin is warm and dry.  HEENT is normal.  Neck is supple. No  thyromegaly.  Chest is clear to auscultation with normal expansion.  Cardiovascular exam is regular rate and rhythm.  Abdominal exam nontender or distended. No masses palpated. Extremities show no edema. neuro grossly intact    Impression & Recommendations:  Problem # 1:  PERIPHERAL VASCULAR DISEASE (ICD-443.9) Continue aspirin and statin. She will followup with Dr. [name] when she is ready to proceed with arteriogram.  Problem # 2:  TOBACCO ABUSE (ICD-305.1) Patient counseled on discontinuing.  Problem # 3:  DIZZINESS (ICD-780.4) Neurology consult pending.  Problem # 4:  HYPERTENSION (ICD-401.9) Blood pressure elevated she has not taken her medications this morning. Continue present doses. Her updated medication list for this problem includes:    Coreg 3.125 Mg Tabs (Carvedilol) .[name]... 1 tab by mouth two times a day    Cvs Aspirin 325 Mg Tabs (Aspirin) ..... One daily    Benicar 20 Mg Tabs (Olmesartan medoxomil) .[name]... 1 by mouth once daily  Problem # 5:  HYPERLIPIDEMIA (ICD-272.4) Continue statin. Lipids and  liver monitored by primary care. Her updated medication list for this problem includes:    Simvastatin 40 Mg Tabs (Simvastatin) .[name]... Take 1 tab by mouth at bedtime for cholesterol  Problem # 6:  CARDIOMYOPATHY, SECONDARY (ICD-425.9) Followup echocardiogram revealed cardiomyopathy had improved. Continue beta blocker and ARB. Her updated medication list for this problem includes:    Coreg 3.125 Mg Tabs (Carvedilol) .[name]... 1 tab by mouth two times a day    Cvs Aspirin 325 Mg Tabs (Aspirin) ..... One daily    Benicar 20 Mg Tabs (Olmesartan medoxomil) .[name]... 1 by mouth once daily  Problem # 7:  CORONARY ATHEROSCLEROSIS NATIVE CORONARY ARTERY (ICD-414.01) Continued aspirin, beta blocker, ARB and statin. Her updated medication list for this problem includes:    Coreg 3.125 Mg Tabs (Carvedilol) .[name]... 1 tab by mouth two times a day    Cvs Aspirin 325 Mg Tabs (Aspirin) .....  One daily  Problem # 8:  RHEUMATOID ARTHRITIS (ICD-714.0)  Patient Instructions: 1)  Your physician wants you to follow-up in: 1 year. You will receive a reminder letter in the mail two months in advance. If you don't receive a letter, please call our office to schedule the follow-up appointment. 2)  Your physician recommends that you continue on your current medications as directed. Please refer to the Current Medication list given to you today.

## 2010-09-27 NOTE — Letter (Signed)
Summary: *HSN Results Follow up  [facility]  [address] [place], [place] [address]   Phone: [phone]  Fax: [phone]      08/31/2009   [facility] [address] [place], [place]  [address]   Dear  [name],                            ____S.Drinkard,FNP   ____D. Gore,FNP       ____B. McPherson,MD   ____V. Rankins,MD    ____E. Mulberry,MD    ____N. Daphine Deutscher, FNP  ____D. Reche Dixon, MD    ____K. Philipp Deputy, MD    __x__S. Alben Spittle, PA-C     This letter is to inform you that your recent test(s):  _______Pap Smear    ___x____Lab Test     _______X-ray    ___x____ is within acceptable limits  _______ requires a medication change  _______ requires a follow-up lab visit  _______ requires a follow-up visit with your provider   Comments:       _________________________________________________________ If you have any questions, please contact our office                     Sincerely,  [name] PA-C [facility]

## 2010-09-27 NOTE — Letter (Signed)
Summary: REFAXED REQUESTING RECORDS FROM [name]  [facility] REQUESTING RECORDS FROM [facility]   Imported By: Silvio Pate Stanislawscyk 01/07/2010 11:20:08  _____________________________________________________________________  External Attachment:    Type:   Image     Comment:   External Document

## 2010-09-27 NOTE — Progress Notes (Signed)
  Phone Note Outgoing Call   Summary of Call: Patient d/w Marchelle Folks starting anti-depressant. She can start Celexa 10 mg once daily. She should keep f/u with Marchelle Folks in 2 weeks to make sure she is doing ok with the med. Rx in basket to send to her pharmacy. Initial call taken by: Brynda Rim,  November 19, 2009 3:04 PM  Follow-up for Phone Call        spoke with pt and she is aware of med  Follow-up by: Arm[place] Shannon,  November 22, 2009 3:25 PM    New/Updated Medications: CELEXA 10 MG TABS (CITALOPRAM HYDROBROMIDE) Take 1 tablet by mouth once a day Prescriptions: CELEXA 10 MG TABS (CITALOPRAM HYDROBROMIDE) Take 1 tablet by mouth once a day  #30 x 3   Entered and Authorized by:   [name] PA-C   Signed by:   [name] PA-C on 11/19/2009   Method used:   Printed then faxed to ...       [facility] (retail)       [address] [place], [place]  [address]       Ph: [phone]       Fax: [phone]   RxID:   [id number]     Impression & Recommendations:  Problem # 1:  DEPRESSION (ICD-311)  patient met with LCSW and agreeable to starting SSRI now will start celexa 10 mg f/u with LCSW in 2 weeks  Her updated medication list for this problem includes:    Celexa 10 Mg Tabs (Citalopram hydrobromide) .[name]... Take 1 tablet by mouth once a day  Complete Medication List: 1)  Carvedilol 12.5 Mg Tabs (Carvedilol) .... Take one tablet by mouth twice a day 2)  Simvastatin 40 Mg Tabs (Simvastatin) .... One tab by mouth everyday 3)  Cvs Aspirin 325 Mg Tabs (Aspirin) .... One daily 4)  Hydrochlorothiazide 25 Mg Tabs (Hydrochlorothiazide) .... 1/2 tab by mouth once daily 5)  Benicar 20 Mg Tabs (Olmesartan medoxomil) .... Take 1 tablet by mouth once a day 6)  Proventil Hfa 108 (90 Base) Mcg/act Aers (Albuterol sulfate) .[name].. 1-2 puffs every 4-6 hours as needed 7)  Celexa 10 Mg Tabs (Citalopram hydrobromide) .... Take 1 tablet by mouth  once a day

## 2010-09-27 NOTE — Assessment & Plan Note (Signed)
Summary: 4 week f/u /tmm   Vital Signs:  Patient profile:   55 year old female Menstrual status:  postmenopausal Height:      66 inches Weight:      128 pounds BMI:     20.73 Temp:     98.3 degrees F oral Pulse rate:   68 / minute Pulse rhythm:   regular Resp:     18 per minute BP sitting:   122 / 78  (left arm)  Vitals Entered By: Armenia Shannon (April 06, 2010 11:29 AM) CC: follow-up visit, patient states no cuurent issues Is Patient Diabetic? No Pain Assessment Patient in pain? no       Does patient need assistance? Functional Status Self care Ambulation Normal   Primary Care Provider:  [name] PA-C  CC:  follow-up visit and patient states no cuurent issues.  History of Present Illness: Here for f/u. Dizziness resolved.  Most likely from bradycardia and low BP. BP and HR now better. No chest pain, dyspnea or syncope. Feels good.  No complaints. Previously discussed [address] Chiari malformation with her.  She is waiting to hear about Medicaid next week before deciding to go to [facility] or [place].   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) 2)  Bradycardia  (ICD-427.89) 3)  Dizziness  (ICD-780.4) 4)  Back Pain  (ICD-724.5) 5)  Atherosclerosis W /int Claudication  (ICD-440.21) 6)  Depression  (ICD-311) 7)  Peripheral Vascular Disease  (ICD-443.9) 8)  Hypopotassemia  (ICD-276.8) 9)  Encounter For Long-term Use of Other Medications  (ICD-V58.69) 10)  Dyspnea  (ICD-786.05) 11)  Leg Pain  (ICD-729.5) 12)  Tobacco Abuse  (ICD-305.1) 13)  Hypertension  (ICD-401.9) 14)  Hyperlipidemia  (ICD-272.4) 15)  Chest Pain  (ICD-786.50) 16)  Preventive Health Care  (ICD-V70.0) 17)  Cardiomyopathy, Secondary  (ICD-425.9) 18)  Coronary Atherosclerosis Native Coronary Artery  (ICD-414.01) 19)  Family History Diabetes 1st Degree Relative  (ICD-V18.0) 20)  Rheumatoid Arthritis  (ICD-714.0) 21)  Gerd  (ICD-530.81)  Current Medications (verified): 1)   Coreg 3.125 Mg Tabs (Carvedilol) .[name].. 1 Tab By Mouth Two Times A Day 2)  Cvs Aspirin 325 Mg Tabs (Aspirin) .... One Daily 3)  Benicar 20 Mg Tabs (Olmesartan Medoxomil) .[name].. 1 By Mouth Once Daily 4)  Proventil Hfa 108 (90 Base) Mcg/act Aers (Albuterol Sulfate) .[name].. 1-2 Puffs Every 4-6 Hours As Needed 5)  Simvastatin 40 Mg Tabs (Simvastatin) .... Take 1 Tab By Mouth At Bedtime For Cholesterol 6)  Naproxen 500 Mg Tabs (Naproxen) .... Take 1 Tablet By Mouth Two Times A Day As Needed For Pain (Take With Food) 7)  Pepcid 20 Mg Tabs (Famotidine) .... Take 1 Tablet By Mouth Two Times A Day For 4 Weeks, Then Take Two Times A Day As Needed 8)  Meclizine Hcl 25 Mg Tabs (Meclizine Hcl) .... Take One By Mouth Every 8-12 Hours As Needed For Dizziness  Allergies (verified): No Known Drug Allergies  Past History:  Past Medical History: Last updated: 03/22/2010 HYPERTENSION (ICD-401.9) HYPERLIPIDEMIA (ICD-272.4) Takotsubo cardiomyopathy CORONARY ATHEROSCLEROSIS NATIVE CORONARY ARTERY (ICD-414.01) CAD   a.  NSTEMI 07/2009: min. Nonobs CAD (LAD irreg; 40% ostial D2 and no CFX or RCA disease)   b.  EF 50% by cath with apical AK   c.  Tako-Tsubo CM   d.  f/u Echo 09/2009: EF 50-55%; mild LVH; mod diast. dysfxn; mild apical HK RHEUMATOID ARTHRITIS (ICD-714.0)    a.  previously with Dr. [name] PVD-reduced ABI  2/11. GERD (ICD-530.81) Postmenopausal Mechanical Back Pain   a.  previously followed by Dr. [name]   b.  last MRI done 2.2010 . . . ESI offered but pt. rejected; completed PT several times   c.  NCV normal; pt could not tolerate EMG ? CVA 01/2010 (dizziness)   a.  carotid dopplers with mild plaque; no significant ICA stenosis 02/24/2010 Type 1 Arnold Chiari Malformation   a.  MRI 02/2010  Physical Exam  General:  alert, well-developed, and well-nourished.   Head:  normocephalic and atraumatic.   Neck:  supple and no carotid bruits.   Lungs:  normal breath sounds.   Heart:  normal rate and  regular rhythm.   Extremities:  no edema  Neurologic:  alert & oriented X3 and cranial nerves II-XII intact.   Psych:  normally interactive.     Impression & Recommendations:  Problem # 1:  DIZZINESS (ICD-780.4) Assessment Improved resolved  Her updated medication list for this problem includes:    Meclizine Hcl 25 Mg Tabs (Meclizine hcl) .[name]... 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 1 tab by mouth two times a day    Cvs Aspirin 325 Mg Tabs (Aspirin) ..... One daily  Problem # 4:  HYPERTENSION (ICD-401.9) Assessment: Improved  continue same dose of meds  Her updated medication list for this problem includes:    Coreg 3.125 Mg Tabs (Carvedilol) .[name]... 1 tab by mouth two times a day    Benicar 20 Mg Tabs (Olmesartan medoxomil) .[name]... 1 by mouth once daily  Orders: T-Basic Metabolic Panel [phone])  Complete Medication List: 1)  Coreg 3.125 Mg Tabs (Carvedilol) .[name].. 1 tab by mouth two times a day 2)  Cvs Aspirin 325 Mg Tabs (Aspirin) .... One daily 3)  Benicar 20 Mg Tabs (Olmesartan medoxomil) .[name].. 1 by mouth once daily 4)  Proventil Hfa 108 (90 Base) Mcg/act Aers (Albuterol sulfate) .[name].. 1-2 puffs every 4-6 hours as needed 5)  Simvastatin 40 Mg Tabs (Simvastatin) .... Take 1 tab by mouth at bedtime for cholesterol 6)  Naproxen 500 Mg Tabs (Naproxen) .... Take 1 tablet by mouth two times a day as needed for pain (take with food) 7)  Pepcid 20 Mg Tabs (Famotidine) .... Take 1 tablet by mouth two times a day for 4 weeks, then take two times a day as needed 8)  Meclizine Hcl 25 Mg Tabs (Meclizine hcl) .... Take one by mouth every 8-12 hours as needed for  dizziness  Patient Instructions: 1)  Please let me know if you get Medicaid and go to see Neurology in [place] or if you need to go to [facility]. 2)  I don't want to put this off more than 3 months. 3)  Please schedule a follow-up appointment in 3 months with Mayia Megill for CPP.  Cancel appointment for 04/25/2010. 4)      EKG  Procedure date:  04/06/2010  Findings:      Sinus bradycardia with rate of:  56 leftward axis nonspecific ST TW changes

## 2010-09-27 NOTE — Progress Notes (Signed)
Summary: Office Visit//DEPRESSION SCREENING  Office Visit//DEPRESSION SCREENING   Imported By: Arta Bruce 01/05/2010 14:45:22  _____________________________________________________________________  External Attachment:    Type:   Image     Comment:   External Document

## 2010-09-27 NOTE — Assessment & Plan Note (Signed)
Summary: Bronchitis   Vital Signs:  Patient profile:   55 year old female Menstrual status:  postmenopausal Height:      66 inches Weight:      128 pounds BMI:     20.73 Temp:     97.4 degrees F oral Pulse rate:   72 / minute Pulse rhythm:   regular Resp:     18 per minute BP sitting:   120 / 78  (left arm) Cuff size:   regular  Vitals Entered By: [place] Shannon (June 08, 2010 11:48 AM) CC: pt is here for cough and cold...   pt says she has been nausea...[name] pt says her throat hurts because she has been coughing alot.... pt says she has not took any OTC meds.... Is Patient Diabetic? No Pain Assessment Patient in pain? no       Does patient need assistance? Functional Status Self care Ambulation Normal   Primary Care Provider:  [name] PA-C  CC:  pt is here for cough and cold...   pt says she has been nausea...[name] pt says her throat hurts because she has been coughing alot.... pt says she has not took any OTC meds.....  History of Present Illness: Here for cough for 10 days.  Also constipated.  Notes rhinorrhea.  This has resolved but sputum got thicker.  + fever (101).  Sputum:  thick; off-white.  No hemoptysis.  No wheezing.  Feels short of breath after coughing only.  No chest pain.  + sore throat.  + headache . [name] .points to frontal sinuses bilat.  + nasal congestion.  No otalgia . . .did have shooting pain once on right.  No vomiting or diarrhea.  No rashes.  No myalgias or arthralgias.   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) 3)  Bradycardia  (ICD-427.89) 4)  Dizziness  (ICD-780.4) 5)  Back Pain  (ICD-724.5) 6)  Atherosclerosis W /int Claudication  (ICD-440.21) 7)  Depression  (ICD-311) 8)  Peripheral Vascular Disease  (ICD-443.9) 9)  Hypopotassemia  (ICD-276.8) 10)  Encounter For Long-term Use of Other Medications  (ICD-V58.69) 11)  Dyspnea  (ICD-786.05) 12)  Leg Pain  (ICD-729.5) 13)  Tobacco  Abuse  (ICD-305.1) 14)  Hypertension  (ICD-401.9) 15)  Hyperlipidemia  (ICD-272.4) 16)  Chest Pain  (ICD-786.50) 17)  Preventive Health Care  (ICD-V70.0) 18)  Cardiomyopathy, Secondary  (ICD-425.9) 19)  Coronary Atherosclerosis Native Coronary Artery  (ICD-414.01) 20)  Family History Diabetes 1st Degree Relative  (ICD-V18.0) 21)  Rheumatoid Arthritis  (ICD-714.0) 22)  Gerd  (ICD-530.81)  Current Medications (verified): 1)  Coreg 3.125 Mg Tabs (Carvedilol) .[name].. 1 Tab By Mouth Two Times A Day 2)  Cvs Aspirin 325 Mg Tabs (Aspirin) .... One Daily 3)  Benicar 20 Mg Tabs (Olmesartan Medoxomil) .[name].. 1 By Mouth Once Daily 4)  Proventil Hfa 108 (90 Base) Mcg/act Aers (Albuterol Sulfate) .[name].. 1-2 Puffs Every 4-6 Hours As Needed 5)  Simvastatin 40 Mg Tabs (Simvastatin) .... Take 1 Tab By Mouth At Bedtime For Cholesterol 6)  Naproxen 500 Mg Tabs (Naproxen) .... Take 1 Tablet By Mouth Two Times A Day As Needed For Pain (Take With Food) 7)  Pepcid 20 Mg Tabs (Famotidine) .... Take 1 Tablet By Mouth Two Times A Day For 4 Weeks, Then Take Two Times A Day As Needed 8)  Meclizine Hcl 25 Mg Tabs (Meclizine Hcl) .... Take One By Mouth Every 8-12 Hours As Needed For  Dizziness  Allergies (verified): No Known Drug Allergies  Past History:  Past Medical History: Last updated: 03/22/2010 HYPERTENSION (ICD-401.9) HYPERLIPIDEMIA (ICD-272.4) Takotsubo cardiomyopathy CORONARY ATHEROSCLEROSIS NATIVE CORONARY ARTERY (ICD-414.01) CAD   a.  NSTEMI 07/2009: min. Nonobs CAD (LAD irreg; 40% ostial D2 and no CFX or RCA disease)   b.  EF 50% by cath with apical AK   c.  Tako-Tsubo CM   d.  f/u Echo 09/2009: EF 50-55%; mild LVH; mod diast. dysfxn; mild apical HK RHEUMATOID ARTHRITIS (ICD-714.0)    a.  previously with Dr. [name] PVD-reduced ABI 2/11. GERD (ICD-530.81) Postmenopausal Mechanical Back Pain   a.  previously followed by Dr. [name]   b.  last MRI done 2.2010 . . . ESI offered but pt. rejected;  completed PT several times   c.  NCV normal; pt could not tolerate EMG ? CVA 01/2010 (dizziness)   a.  carotid dopplers with mild plaque; no significant ICA stenosis 02/24/2010 Type 1 Arnold Chiari Malformation   a.  MRI 02/2010  Physical Exam  General:  alert, well-developed, and well-nourished.   Head:  normocephalic and atraumatic.   Eyes:  pupils equal, pupils round, and pupils reactive to light.   Ears:  R ear normal and L ear normal.   Nose:  no nasal discharge.   Mouth:  pharynx pink and moist, no erythema, and no exudates.   Lungs:  normal breath sounds, no crackles, and no wheezes.   Heart:  normal rate and regular rhythm.   Abdomen:  soft and non-tender.   Neurologic:  alert & oriented X3 and cranial nerves II-XII intact.   Psych:  normally interactive.     Impression & Recommendations:  Problem # 1:  ACUTE BRONCHITIS (ICD-466.0)  with increased sputum production in a smoker will cover with antibxs zpak robitussin dm fluids, rest, tylenol proventil as needed f/u as needed  Her updated medication list for this problem includes:    Proventil Hfa 108 (90 Base) Mcg/act Aers (Albuterol sulfate) .[name]... 1-2 puffs every 4-6 hours as needed    Zithromax Z-pak 250 Mg Tabs (Azithromycin) .[name]... Take as directed    Robitussin Dm 100-10 Mg/30ml Syrp (Dextromethorphan-guaifenesin) .[name]... 1-2 tsp every 4-6 hours as needed for cough  Orders: Rapid Strep ([address])  Complete Medication List: 1)  Coreg 3.125 Mg Tabs (Carvedilol) .[name].. 1 tab by mouth two times a day 2)  Cvs Aspirin 325 Mg Tabs (Aspirin) .... One daily 3)  Benicar 20 Mg Tabs (Olmesartan medoxomil) .[name].. 1 by mouth once daily 4)  Proventil Hfa 108 (90 Base) Mcg/act Aers (Albuterol sulfate) .[name].. 1-2 puffs every 4-6 hours as needed 5)  Simvastatin 40 Mg Tabs (Simvastatin) .... Take 1 tab by mouth at bedtime for cholesterol 6)  Naproxen 500 Mg Tabs (Naproxen) .... Take 1 tablet by mouth two times a day as needed for pain (take  with food) 7)  Pepcid 20 Mg Tabs (Famotidine) .... Take 1 tablet by mouth two times a day for 4 weeks, then take two times a day as needed 8)  Meclizine Hcl 25 Mg Tabs (Meclizine hcl) .... Take one by mouth every 8-12 hours as needed for dizziness 9)  Zithromax Z-pak 250 Mg Tabs (Azithromycin) .... Take as directed 10)  Robitussin Dm 100-10 Mg/23ml Syrp (Dextromethorphan-guaifenesin) .[name].. 1-2 tsp every 4-6 hours as needed for cough  Patient Instructions: 1)  Zpak (antibiotic) and Robitussin have been faxed to the pharmacy at [facility].   You should be able to  pick up this afternoon. 2)  You can use your proventil inhaler as needed for cough or wheezing. 3)  Drink plenty of water.  Get plenty of rest.  Use tylenol as needed for pain or fever. 4)  Follow up if no better in 7 days or sooner if worse. Prescriptions: ROBITUSSIN DM 100-10 MG/5ML SYRP (DEXTROMETHORPHAN-GUAIFENESIN) 1-2 tsp every 4-6 hours as needed for cough  #250 mL x 0   Entered and Authorized by:   [name] PA-C   Signed by:   [name] PA-C on 06/08/2010   Method used:   Faxed to ...       [facility] (retail)       [address] [place], [place]  [address]       Ph: [phone]       Fax: [phone]   RxID:   [id number] ZITHROMAX Z-PAK 250 MG TABS (AZITHROMYCIN) Take as directed  #1 pack x 0   Entered and Authorized by:   [name] PA-C   Signed by:   [name] PA-C on 06/08/2010   Method used:   Faxed to ...       [facility] (retail)       [address] [place], [place]  [address]       Ph: [phone]       Fax: [phone]   RxID:   [id number]   Laboratory Results    Other Tests  Rapid Strep: negative

## 2010-09-27 NOTE — Letter (Signed)
Summary: test order form/mri with & without contrast  test order form/mri with & without contrast   Imported By: Arta Bruce 03/17/2010 12:51:54  _____________________________________________________________________  External Attachment:    Type:   Image     Comment:   External Document

## 2010-09-27 NOTE — Letter (Signed)
Summary: NONINVASIVE VASCULAR LAB  NONINVASIVE VASCULAR LAB   Imported By: Arta Bruce 03/08/2010 10:35:11  _____________________________________________________________________  External Attachment:    Type:   Image     Comment:   External Document

## 2010-09-27 NOTE — Letter (Signed)
Summary: *HSN Results Follow up  [facility]  [address] [place], [place] [address]   Phone: [phone]  Fax: [phone]      11/03/2009   [facility] [address] [place], [place]  [address]   Dear  [name],                            ____S.Drinkard,FNP   ____D. Gore,FNP       ____B. McPherson,MD   ____V. Rankins,MD    ____E. Mulberry,MD    ____N. Daphine Deutscher, FNP  ____D. Reche Dixon, MD    ____K. Philipp Deputy, MD    __x__S. Alben Spittle, PA-C     This letter is to inform you that your recent test(s):  _______Pap Smear    ___x____Lab Test     _______X-ray    ___x____ is within acceptable limits  _______ requires a medication change  _______ requires a follow-up lab visit  _______ requires a follow-up visit with your provider   Comments:       _________________________________________________________ If you have any questions, please contact our office                     Sincerely,  [name] PA-C [facility]

## 2010-09-27 NOTE — Miscellaneous (Signed)
Summary: Rheumatoid Arthritis dx reviewed  Clinical Lists Changes  Problems: Assessed RHEUMATOID ARTHRITIS as comment only - ESR normal d/w Dr. Reche Dixon no need to start DMARDS with no evidence of synovitis and neg ESR  Her updated medication list for this problem includes:    Cvs Aspirin 325 Mg Tabs (Aspirin) ..... One daily        Impression & Recommendations:  Problem # 1:  RHEUMATOID ARTHRITIS (ICD-714.0) ESR normal d/w Dr. Reche Dixon no need to start DMARDS with no evidence of synovitis and neg ESR  Her updated medication list for this problem includes:    Cvs Aspirin 325 Mg Tabs (Aspirin) ..... One daily  Complete Medication List: 1)  Carvedilol 12.5 Mg Tabs (Carvedilol) .... Take one tablet by mouth twice a day 2)  Simvastatin 40 Mg Tabs (Simvastatin) .... One tab by mouth everyday 3)  Cvs Aspirin 325 Mg Tabs (Aspirin) .... One daily 4)  Hydrochlorothiazide 25 Mg Tabs (Hydrochlorothiazide) .... 1/2 tab by mouth once daily

## 2010-09-27 NOTE — Letter (Signed)
Summary: REFERRAL//PHYSICAL THERAPY//APPT DATE & TIME  REFERRAL//PHYSICAL THERAPY//APPT DATE & TIME   Imported By: Arta Bruce 02/23/2010 12:19:50  _____________________________________________________________________  External Attachment:    Type:   Image     Comment:   External Document

## 2010-09-27 NOTE — Letter (Signed)
Summary: LE  BAUER NOTES  LE  BAUER NOTES   Imported By: Arta Bruce 12/21/2009 16:00:26  _____________________________________________________________________  External Attachment:    Type:   Image     Comment:   External Document

## 2010-09-27 NOTE — Progress Notes (Signed)
Summary: [facility] CALLED   Phone Note From Other Clinic   Caller: SUSAN [facility]DAY[facility] Summary of Call: MS DILDAY CALLED AND WANTED TO LET YOU KNOW THAT SHE SAW MS Baria, AND MS Agustin IS CONCERNED THAT HER DIZZINESS IS COMING FROM HER HEART, AND SHE IS GOING TO SEE HER CARDIOLOGIST ON FRIDAY ( 07/15).  Initial call taken by: Leodis Rains,  March 10, 2010 11:29 AM  Follow-up for Phone Call        see phone note from 7.13.2011 ("Neuro Referral") Follow-up by: [name] PA-C,  March 15, 2010 10:15 AM

## 2010-09-27 NOTE — Progress Notes (Signed)
Summary: Neuro referral  Phone Note Outgoing Call   Summary of Call: Needs referral to neurology.  Ok to send to [facility] (she is orange card).  Needs eval for dizziness.  Please see if there is anyway they can see her within the next 3-4 weeks.  I am working her up for a possible stroke. Initial call taken by: [name] PA-C,  March 09, 2010 1:19 PM  Follow-up for Phone Call        Left a message for Pt to call regarding neuro referral  Follow-up by: Candi Leash,  March 09, 2010 2:51 PM  Additional Follow-up for Phone Call Additional follow up Details #1::        Pt said that she can't get to [facility] . what will you like to do ?  Additional Follow-up by: Cheryll Dessert,  March 11, 2010 2:33 PM    Additional Follow-up for Phone Call Additional follow up Details #2::    [place] Please find out what Parker Puccio would like to do. We discussed this.  I told her that I was going to send her to [facility].  She needs to see neurology.  Her other option is paying $200-300 to see someone in [place]. [name] PA-C  March 14, 2010 2:38 PM   Additional Follow-up for Phone Call Additional follow up Details #3:: Details for Additional Follow-up Action Taken: pt says she has a friend that is a Engineer, civil (consulting) and told her that her bp med might have been the cause... pt says she stop the benicar and she feels a lot better... pt says her bp is good and pulse good and no slow heart rate... pt says she feels alot better.... pt says she has not stopped benicar completely but is taking it every other day.. pt says her dizzness is gone so she thinks she doesnt need to see [facility] neurologist any more  Ok.  She should stop the Benicar/HCT.  Continue to take the lower dose of Carvedilol.  Come in for a blood pressure check and ECG in one week.  She needs to be on something like benicar or a lower dose.  Ask if she has ever taken Lisinopril or did she get switched to Benicar because something was making her cough.  I want to get her on a  lower dose of something so we can see what her BP is doing in a week. [name] PA-C  March 15, 2010 10:11 AM  pt says she has always on benicar but just benicar not benicar/hct..... appt scheduled... [place] Shannon  March 15, 2010 12:10 PM

## 2010-09-27 NOTE — Assessment & Plan Note (Signed)
Summary: Discuss test results // tl   Vital Signs:  Patient profile:   55 year old female Menstrual status:  postmenopausal Weight:      126 pounds Temp:     98.2 degrees F oral Pulse rate:   54 / minute Pulse rhythm:   regular Resp:     18 per minute BP sitting:   113 / 78  (right arm) Cuff size:   regular  Vitals Entered By: Vesta Mixer CMA (March 09, 2010 11:51 AM) CC: F/u for test results Is Patient Diabetic? No Pain Assessment Patient in pain? no       Does patient need assistance? Functional Status Self care Ambulation Normal   Primary Care Provider:  [name] PA-C  CC:  F/u for test results.  History of Present Illness: 55 year old female returns for follow up on dizziness.  Her history is now somewhat inconsistent.  She tells her that she saw me several weeks before she went to the emergency room and complained of dizziness.  I looked in the chart, she had not been seen since April.  I reviewed her prior reports with me and she did confirm that she went to the emergency room on June 24 after 3 or 4 days of dizziness.  Again, her dizziness started suddenly when she awoke from sleep.  She has felt that way since.  She really describes a lightheaded sensation.  It gets worse when she stands up and walks around.  She really denies syncope or near-syncope.  She now denies any spinning sensation.  She denies any facial drooping or numbness.  She denies unilateral weakness.  She denies any symptoms consistent with amaurosis fugax.  She denies chest pain, dyspnea, orthopnea, PND.  I sent her to physical therapy to be evaluated for her dizziness.  I do not have any notes back yet.  Apparently she was told that she does not have vertigo.  She has been asked to follow up with me before any further therapy is pursued.  Problems Prior to Update: 1)  Bradycardia  (ICD-427.89) 2)  Dizziness  (ICD-780.4) 3)  Back Pain  (ICD-724.5) 4)  Atherosclerosis W /int Claudication   (ICD-440.21) 5)  Depression  (ICD-311) 6)  Peripheral Vascular Disease  (ICD-443.9) 7)  Hypopotassemia  (ICD-276.8) 8)  Encounter For Long-term Use of Other Medications  (ICD-V58.69) 9)  Dyspnea  (ICD-786.05) 10)  Leg Pain  (ICD-729.5) 11)  Tobacco Abuse  (ICD-305.1) 12)  Hypertension  (ICD-401.9) 13)  Hyperlipidemia  (ICD-272.4) 14)  Chest Pain  (ICD-786.50) 15)  Preventive Health Care  (ICD-V70.0) 16)  Cardiomyopathy, Secondary  (ICD-425.9) 17)  Coronary Atherosclerosis Native Coronary Artery  (ICD-414.01) 18)  Family History Diabetes 1st Degree Relative  (ICD-V18.0) 19)  Rheumatoid Arthritis  (ICD-714.0) 20)  Gerd  (ICD-530.81)  Current Medications (verified): 1)  Carvedilol 12.5 Mg Tabs (Carvedilol) .... Take One Tablet By Mouth Twice A Day 2)  Cvs Aspirin 325 Mg Tabs (Aspirin) .... One Daily 3)  Benicar Hct 20-12.5 Mg Tabs (Olmesartan Medoxomil-Hctz) .[name].. 1 Tab Once Daily 4)  Proventil Hfa 108 (90 Base) Mcg/act Aers (Albuterol Sulfate) .[name].. 1-2 Puffs Every 4-6 Hours As Needed 5)  Simvastatin 40 Mg Tabs (Simvastatin) .... Take 1 Tab By Mouth At Bedtime For Cholesterol 6)  Naproxen 500 Mg Tabs (Naproxen) .... Take 1 Tablet By Mouth Two Times A Day As Needed For Pain (Take With Food) 7)  Pepcid 20 Mg Tabs (Famotidine) .... Take 1 Tablet By Mouth Two Times  A Day For 4 Weeks, Then Take Two Times A Day As Needed 8)  Meclizine Hcl 25 Mg Tabs (Meclizine Hcl) .... Take One By Mouth Every 8-12 Hours As Needed For Dizziness  Allergies (verified): No Known Drug Allergies  Past History:  Past Medical History: Last updated: 03/03/2010 Current Problems:  HYPERTENSION (ICD-401.9) HYPERLIPIDEMIA (ICD-272.4) Takotsubo cardiomyopathy CORONARY ATHEROSCLEROSIS NATIVE CORONARY ARTERY (ICD-414.01) CAD   a.  NSTEMI 07/2009: min. Nonobs CAD (LAD irreg; 40% ostial D2 and no CFX or RCA disease)   b.  EF 50% by cath with apical AK   c.  Tako-Tsubo CM   d.  f/u Echo 09/2009: EF 50-55%; mild LVH;  mod diast. dysfxn; mild apical HK RHEUMATOID ARTHRITIS (ICD-714.0)    a.  previously with Dr. [name] PVD-reduced ABI 2/11. GERD (ICD-530.81) Postmenopausal Mechanical Back Pain   a.  previously followed by Dr. [name]   b.  last MRI done 2.2010 . . . ESI offered but pt. rejected; completed PT several times   c.  NCV normal; pt could not tolerate EMG ? CVA 01/2010 (dizziness)   a.  carotid dopplers with mild plaque; no significant ICA stenosis 02/24/2010  Family History: Last updated: 11/23/2009 CAD - father with MI 39s, deceased. Family History Diabetes 1st degree relative - dad Family History Hypertension - dad Emphysema - mom, deceased 29-Nov-2022.  Social History: Last updated: 11/23/2009 Occupation: unemployed; housekeeping Single 3 kids Patient is smoking 2-3 cigarettes per day. Alcohol use-none Drug use-none  Review of Systems      See HPI General:  Denies chills and fever. Eyes:  Complains of halos. CV:  Denies chest pain or discomfort, fainting, and shortness of breath with exertion. Resp:  Denies cough. GI:  Denies bloody stools and dark tarry stools. Neuro:  See HPI.  Physical Exam  General:  alert, well-developed, and well-nourished.   Head:  normocephalic and atraumatic.   Eyes:  pupils equal, pupils round, pupils reactive to light, and no optic disk abnormalities.   Neck:  supple and no carotid bruits.   Lungs:  normal breath sounds, no crackles, and no wheezes.   Heart:  normal rate, regular rhythm, and no murmur.   Extremities:  no edema  Neurologic:  alert & oriented X3, cranial nerves II-XII intact, strength normal in all extremities, gait normal, DTRs symmetrical and normal, finger-to-nose normal, heel-to-shin normal, toes down bilaterally on Babinski, and Romberg negative.   Psych:  normally interactive.     Impression & Recommendations:  Problem # 1:  DIZZINESS (ICD-780.4) Assessment Unchanged  Etiology still unclear. Of note, CBC and CMET in the  ED on 6/24 were normal.  Cardiac NZ's were negative. She had a 7 mm hypodense lesion, posterior frontal lobe on her CT scan.   I performed carotid Dopplers recently and she had no evidence of significant ICA stenosis.  She did have some plaque buildup. I suggested that she remain on aspirin and to continue taking her blood pressure medications and cholesterol medications.   I reviewed her testing with her today. I discussed her case today over the telephone with Dr. Marjory Lies at [facility] neurologic.  He really had no other suggestions without seeing the patient. I discussed with her the possibility of proceeding with an MRI/MRA.  She tells her that she's been approved for some type of discomfort .  She will see what this test will cost her and try to obtain if the financial burden is not too great.  This would help  rule out any type of cranial nerve 8 tumor or other etiologies for her dizziness. I did explain to her today the dizziness is often hard to evaluate. Of note, she does have marked sinus bradycardia on her electrocardiogram today.  I question whether or not this may be part of her symptoms.  I will decrease her carvedilol. I will also refer her to neurology.  Her updated medication list for this problem includes:    Meclizine Hcl 25 Mg Tabs (Meclizine hcl) .[name]... Take one by mouth every 8-12 hours as needed for dizziness  Orders: Neurology Referral (Neuro) MRI (MRI)  Problem # 2:  BRADYCARDIA (ICD-427.89) As noted above. Decrease her carvedilol to have a tablet twice a day.  We'll bring her back in one week to see the nurse for blood pressure check and EKG. She should notify me if her symptoms dramatically improved with adjustments in her medication. She does see her cardiologist sometime in the next one to 2 weeks. Will give new Rx in case she cannot cut pills in 1/2  Her updated medication list for this problem includes:    Carvedilol 6.25 Mg Tabs (Carvedilol) .[name]...  Take 1 tablet by mouth two times a day    Cvs Aspirin 325 Mg Tabs (Aspirin) ..... One daily  Complete Medication List: 1)  Carvedilol 6.25 Mg Tabs (Carvedilol) .... Take 1 tablet by mouth two times a day 2)  Cvs Aspirin 325 Mg Tabs (Aspirin) .... One daily 3)  Benicar Hct 20-12.5 Mg Tabs (Olmesartan medoxomil-hctz) .[name].. 1 tab once daily 4)  Proventil Hfa 108 (90 Base) Mcg/act Aers (Albuterol sulfate) .[name].. 1-2 puffs every 4-6 hours as needed 5)  Simvastatin 40 Mg Tabs (Simvastatin) .... Take 1 tab by mouth at bedtime for cholesterol 6)  Naproxen 500 Mg Tabs (Naproxen) .... Take 1 tablet by mouth two times a day as needed for pain (take with food) 7)  Pepcid 20 Mg Tabs (Famotidine) .... Take 1 tablet by mouth two times a day for 4 weeks, then take two times a day as needed 8)  Meclizine Hcl 25 Mg Tabs (Meclizine hcl) .... Take one by mouth every 8-12 hours as needed for dizziness  Patient Instructions: 1)  Decrease carvedilol to 12.5 mg take one half tablet two times a day. 2)  Return in one week to see the nurse for a blood pressure check and electrocardiogram. 3)  If you feel better with a decrease in your medication, call me before you proceed with your MRI. 4)  Someone will call you to set up a referral to the neurologist. 5)  Schedule a follow up with Kriston Pasquarello in 4 weeks. Prescriptions: CARVEDILOL 6.25 MG TABS (CARVEDILOL) Take 1 tablet by mouth two times a day  #60 x 5   Entered and Authorized by:   [name] PA-C   Signed by:   [name] PA-C on 03/09/2010   Method used:   Print then Give to Patient   RxID:   [id number]    EKG  Procedure date:  03/09/2010  Findings:      Sinus bradycardia with rate of:  47 leftward axis nonspecific ST-TW changes

## 2010-09-27 NOTE — Letter (Signed)
Summary: VASCULAR LAB  VASCULAR LAB   Imported By: Arta Bruce 12/21/2009 15:55:42  _____________________________________________________________________  External Attachment:    Type:   Image     Comment:   External Document

## 2010-09-27 NOTE — Assessment & Plan Note (Signed)
Summary: FU FROM ED/POSSIBLE STROKE//KT   Vital Signs:  Patient profile:   55 year old female Menstrual status:  postmenopausal Height:      66 inches Weight:      126 pounds BMI:     20.41 Temp:     99.2 degrees F oral Pulse rate:   76 / minute Pulse rhythm:   regular Resp:     18 per minute BP sitting:   160 / 110  (left arm) Cuff size:   regular  Vitals Entered By: Arm[place] Shannon (February 21, 2010 2:39 PM) CC: f/u on ed Is Patient Diabetic? No Pain Assessment Patient in pain? no       Does patient need assistance? Functional Status Self care Ambulation Normal   Primary Care Provider:  [name] PA-C  CC:  f/u on ed.  History of Present Illness: Here for ED follow up.  She states that she feels dizzy for the past 4 days.  Friday morning, she noted from the time she awoke. Dizzy all the time.  Holding her head down makes it worse.  She does feel like she is spinning.  She states it feels like being drunk.  She did have some numbness in her left face, but this has resolved.  No weakness.  No difficulty with speech.  No transient blindness.  She had a CT that was negative for a bleed but she did have a 7mm hypodensity in the posterior right frontal lobe of uncertain chronicity.  A small lacunar infarction, age indeterminate, could not be excluded.  She was to be admitted for further evaluation.  An H&P is in [place].  However, she states that someone claimed she was using crack during the admission process.  She left AMA and decided to f/u here with me today.  She is still dizzy.  States she is woozy.  No more numbness in face.   No weakness.  No trouble with speech.  She is taking her BP meds.  She has never had dizziness like this before.   Problems Prior to Update: 1)  Dizziness  (ICD-780.4) 2)  Back Pain  (ICD-724.5) 3)  Atherosclerosis W /int Claudication  (ICD-440.21) 4)  Depression  (ICD-311) 5)  Peripheral Vascular Disease  (ICD-443.9) 6)  Hypopotassemia   (ICD-276.8) 7)  Encounter For Long-term Use of Other Medications  (ICD-V58.69) 8)  Dyspnea  (ICD-786.05) 9)  Leg Pain  (ICD-729.5) 10)  Tobacco Abuse  (ICD-305.1) 11)  Hypertension  (ICD-401.9) 12)  Hyperlipidemia  (ICD-272.4) 13)  Chest Pain  (ICD-786.50) 14)  Preventive Health Care  (ICD-V70.0) 15)  Cardiomyopathy, Secondary  (ICD-425.9) 16)  Coronary Atherosclerosis Native Coronary Artery  (ICD-414.01) 17)  Family History Diabetes 1st Degree Relative  (ICD-V18.0) 18)  Rheumatoid Arthritis  (ICD-714.0) 19)  Gerd  (ICD-530.81)  Current Medications (verified): 1)  Carvedilol 12.5 Mg Tabs (Carvedilol) .... Take One Tablet By Mouth Twice A Day 2)  Cvs Aspirin 325 Mg Tabs (Aspirin) .... One Daily 3)  Benicar Hct 20-12.5 Mg Tabs (Olmesartan Medoxomil-Hctz) .[name].. 1 Tab Once Daily 4)  Proventil Hfa 108 (90 Base) Mcg/act Aers (Albuterol Sulfate) .[name].. 1-2 Puffs Every 4-6 Hours As Needed 5)  Simvastatin 40 Mg Tabs (Simvastatin) .... Take 1 Tab By Mouth At Bedtime For Cholesterol 6)  Naproxen 500 Mg Tabs (Naproxen) .... Take 1 Tablet By Mouth Two Times A Day As Needed For Pain (Take With Food) 7)  Pepcid 20 Mg Tabs (Famotidine) .... Take 1 Tablet By Mouth Two  Times A Day For 4 Weeks, Then Take Two Times A Day As Needed  Allergies (verified): No Known Drug Allergies  Past History:  Past Medical History: Last updated: 12/31/2009 Current Problems:  HYPERTENSION (ICD-401.9) HYPERLIPIDEMIA (ICD-272.4) Takotsubo cardiomyopathy CORONARY ATHEROSCLEROSIS NATIVE CORONARY ARTERY (ICD-414.01) CAD   a.  NSTEMI 07/2009: min. Nonobs CAD (LAD irreg; 40% ostial D2 and no CFX or RCA disease)   b.  EF 50% by cath with apical AK   c.  Tako-Tsubo CM   d.  f/u Echo 09/2009: EF 50-55%; mild LVH; mod diast. dysfxn; mild apical HK RHEUMATOID ARTHRITIS (ICD-714.0)    a.  previously with Dr. [name] PVD-reduced ABI 2/11. GERD (ICD-530.81) Postmenopausal Mechanical Back Pain   a.  previously followed by Dr.  [name]   b.  last MRI done 2.2010 . . . ESI offered but pt. rejected; completed PT several times   c.  NCV normal; pt could not tolerate EMG  Review of Systems      See HPI General:  Complains of chills; denies fever. Eyes:  Denies blurring. ENT:  Denies difficulty swallowing. CV:  Denies chest pain or discomfort and shortness of breath with exertion. Resp:  Denies cough and coughing up blood. GI:  Denies bloody stools and dark tarry stools. GU:  Denies dysuria and hematuria. Neuro:  See HPI.  Physical Exam  General:  alert, well-developed, and well-nourished.   Head:  normocephalic and atraumatic.   Eyes:  pupils equal, pupils round, pupils reactive to light, and no optic disk abnormalities.   Ears:  R ear normal and L ear normal.   Nose:  no external deformity.   Mouth:  pharynx pink and moist.   Neck:  supple and no carotid bruits.   Lungs:  normal breath sounds, no crackles, and no wheezes.   Heart:  normal rate, regular rhythm, and no murmur.   Abdomen:  soft, non-tender, normal bowel sounds, and no hepatomegaly.   Msk:  normal ROM.   Extremities:  no edema  Neurologic:  alert & oriented X3, cranial nerves II-XII intact, strength normal in all extremities, gait normal, DTRs symmetrical and normal, finger-to-nose normal, heel-to-shin normal, toes down bilaterally on Babinski, and Romberg negative.   Psych:  normally interactive and good eye contact.     Impression & Recommendations:  Problem # 1:  DIZZINESS (ICD-780.4)  symptoms very suspicious for vertigo however, with her abnormal head CT and multiple risk factors, she very well could have had a stroke exam today is nonfocal had Echo with low normal EF in 09/2009  discussed further with Dr. Audria Nine at [facility]. office could get MRI now, but not sure it would change therapy and would be very expensive in patient without payment source will get dopplers done to r/o ICA stenosis continue ASA for now consider  adding plavix if not getting better will give meclizine to use as needed  send to PT for vestibular rehab close f/u with me  Orders: Carotid Doppler (Carotid doppler) Physical Therapy Referral (PT)  Her updated medication list for this problem includes:    Meclizine Hcl 25 Mg Tabs (Meclizine hcl) .[name]... Take one by mouth every 8-12 hours as needed for dizziness  Complete Medication List: 1)  Carvedilol 12.5 Mg Tabs (Carvedilol) .... Take one tablet by mouth twice a day 2)  Cvs Aspirin 325 Mg Tabs (Aspirin) .... One daily 3)  Benicar Hct 20-12.5 Mg Tabs (Olmesartan medoxomil-hctz) .[name].. 1 tab once daily 4)  Proventil Hfa  108 (90 Base) Mcg/act Aers (Albuterol sulfate) .[name].. 1-2 puffs every 4-6 hours as needed 5)  Simvastatin 40 Mg Tabs (Simvastatin) .... Take 1 tab by mouth at bedtime for cholesterol 6)  Naproxen 500 Mg Tabs (Naproxen) .... Take 1 tablet by mouth two times a day as needed for pain (take with food) 7)  Pepcid 20 Mg Tabs (Famotidine) .... Take 1 tablet by mouth two times a day for 4 weeks, then take two times a day as needed 8)  Meclizine Hcl 25 Mg Tabs (Meclizine hcl) .... Take one by mouth every 8-12 hours as needed for dizziness  Patient Instructions: 1)  Keep taking Aspirin 325 mg once daily. 2)  Get the carotid dopplers done as soon as you can. 3)  Someone will call you to set up physical therapy. 4)  Take meclizine only as needed for dizziness. 5)  Take it easy for now.  No heavy lifting or exertion. 6)  Please schedule a follow-up appointment in 2 weeks with Kammi Hechler or sooner if you feel worse.  Prescriptions: MECLIZINE HCL 25 MG TABS (MECLIZINE HCL) Take one by mouth every 8-12 hours as needed for dizziness  #30 x 0   Entered and Authorized by:   [name] PA-C   Signed by:   [name] PA-C on 02/21/2010   Method used:   Print then Give to Patient   RxID:   [id number]

## 2010-09-27 NOTE — Progress Notes (Signed)
Summary: HAS MEDICAID/ START WITH REFERRAL  Phone Note Call from Patient Call back at Home Phone [phone]   Reason for Call: Referral Summary of Call: [facility]. MS Croker WAS TOLD TO CALL WHEN SHE GETS HER MEDICAID SO SHE CAN GET HER REFERRAL TO THE HEAD DR THAT YOU WERE GOING TO REFER HER TO. SHE WILL BE CALLING us TO GIVE [place] THE MEDICAID ID# Initial call taken by: Leodis Rains,  May 27, 2010 12:57 PM  Follow-up for Phone Call        scott, are you aware of what she is speaking of.... 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 1 tab by mouth two times a day 2)  Cvs Aspirin 325 Mg Tabs (Aspirin) .... One daily 3)  Benicar 20 Mg Tabs (Olmesartan medoxomil) .[name].. 1 by mouth once daily 4)  Proventil Hfa 108 (90 Base) Mcg/act Aers (Albuterol sulfate) .[name].. 1-2 puffs every 4-6 hours as needed 5)  Simvastatin 40 Mg Tabs (Simvastatin) .... Take 1 tab by mouth at bedtime for cholesterol 6)  Naproxen 500 Mg Tabs (Naproxen) .... Take 1 tablet by mouth two times a day as needed for pain (take with food) 7)  Pepcid 20 Mg Tabs (Famotidine) .... Take 1 tablet by mouth two times a day for 4 weeks, then take two times a day as needed 8)  Meclizine Hcl 25 Mg Tabs (Meclizine hcl) .... Take one by mouth every 8-12 hours as needed for dizziness

## 2010-09-27 NOTE — Letter (Signed)
Summary: COUNSELING  COUNSELING   Imported By: Arta Bruce 01/10/2010 11:18:25  _____________________________________________________________________  External Attachment:    Type:   Image     Comment:   External Document

## 2010-09-27 NOTE — Miscellaneous (Signed)
Summary: Orthopedist Notes Reviewed (Dr. Turner Daniels)  Notes from Dr. Suan Halter office reviewed. Basically, Mercedes [name] had been seen by Dr. 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 She basically has mechanical back pain with some mild disc bulging. EMG was attempted in 2010 but not completed as the patient could not tolerate the needles.  NCV was done and was normal.  ESI were offered but the patient continued to decline. She had an [employer] Solicitor) completed by a PT that recommended she find employment in another capacity.  She was released to work with restrictions. Records have been scanned.  Clinical Lists Changes  Observations: Added new observation of PAST MED HX: Current Problems:  HYPERTENSION (ICD-401.9) HYPERLIPIDEMIA (ICD-272.4) Takotsubo cardiomyopathy CORONARY ATHEROSCLEROSIS NATIVE CORONARY ARTERY (ICD-414.01) CAD   a.  NSTEMI 07/2009: min. Nonobs CAD (LAD irreg; 40% ostial D2 and no CFX or RCA disease)   b.  EF 50% by cath with apical AK   c.  Tako-Tsubo CM   d.  f/u Echo 09/2009: EF 50-55%; mild LVH; mod diast. dysfxn; mild apical HK RHEUMATOID ARTHRITIS (ICD-714.0)    a.  previously with Dr. [name] PVD-reduced ABI 2/11. GERD (ICD-530.81) Postmenopausal Mechanical Back Pain   a.  previously followed by Dr. [name]   b.  last MRI done 2.2010 . . . ESI offered but pt. rejected; completed PT several times   c.  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 Osseous overgrowth with shallow L5-S1 disc bulge result in mild to moderate bifromainal stenosis and mild central canal narrowing. 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 Osseous overgrowth with shallow L5-S1 disc bulge result in mild to moderate bifromainal stenosis and mild central canal narrowing.   Past History:  Past Medical History: Current Problems:  HYPERTENSION (ICD-401.9) HYPERLIPIDEMIA (ICD-272.4) Takotsubo cardiomyopathy CORONARY ATHEROSCLEROSIS NATIVE CORONARY ARTERY (ICD-414.01) CAD   a.  NSTEMI 07/2009: min. Nonobs CAD (LAD irreg; 40% ostial D2 and no CFX or RCA disease)   b.  EF 50% by cath with apical AK   c.  Tako-Tsubo CM   d.  f/u Echo 09/2009: EF 50-55%; mild LVH; mod diast. dysfxn; mild apical HK RHEUMATOID ARTHRITIS (ICD-714.0)    a.  previously with Dr. [name] PVD-reduced ABI 2/11. GERD (ICD-530.81) Postmenopausal Mechanical Back Pain   a.  previously followed by Dr. [name]   b.  last MRI done 2.2010 . . . ESI offered but pt. rejected; completed PT several times   c.  NCV normal; pt could not tolerate EMG

## 2010-09-27 NOTE — Assessment & Plan Note (Signed)
Summary: two weeks bp and leg pain//cns   Vital Signs:  Patient profile:   55 year old female Menstrual status:  postmenopausal Height:      66 inches Weight:      12718 pounds BMI:     2060.16 Temp:     97.5 degrees F oral Pulse rate:   87 / minute Pulse rhythm:   regular Resp:     18 per minute BP sitting:   129 / 84  (left arm) Cuff size:   regular  Vitals Entered By: Arm[place] Shannon (November 02, 2009 11:04 AM) CC: two week f/u on bp and leg pain.... pt says she needs k+ checked since [place] lost blood..... Is Patient Diabetic? No Pain Assessment Patient in pain? no       Does patient need assistance? Functional Status Self care Ambulation Normal   Primary Care Provider:  [name] PA-C  CC:  two week f/u on bp and leg pain.... pt says she needs k+ checked since [place] lost blood.....[name]  History of Present Illness: Here for f/u.  Leg pain:  ABIs abnormal esp on right.  Referral pending to PV at [facility].  She did not notice any change in leg symptoms since stopping simvastatin.  CK was normal.  She now describes her pain as more c/w claudication.  Right leg pain is worse.  Gets better with rest.  Not exercising.  Wants to know if she can go back to riding her stationary bike.  HTN:  Looks much better.  Taking HCTZ now.  Had high K+ with labs done at [facility] recently.  Repeat labs lost and needs redrawn.  No chest pain.  No syncope.  No headaches.  Dypsnea:  She had an echo recently that demonstrated EF 50-55%.  This was basically unchanged from her cath.  She is on carvedilol and benicar.  She is still smoking 1-2 cigs per day.  Has long h/o smoking.  No wheezing.  No significant cough.  She never had PFTs done.  Denies PND or orthopnea.  Had some swelling in her right leg last night.  No other pedal edema noted.  RA:  Referral to rheumatology pending.  ESR couple mos ago ok.  No c/o synovitis.  Depression:  No suicidal thoughts.  Feels stressed.  Says she is about to  lose her house.  She denies ever being on meds for depression.  Habits & Providers  Exercise-Depression-Behavior     Have you felt down or hopeless? yes     Have you felt little pleasure in things? yes  Problems Prior to Update: 1)  Depression  (ICD-311) 2)  Peripheral Vascular Disease  (ICD-443.9) 3)  Hypopotassemia  (ICD-276.8) 4)  Encounter For Long-term Use of Other Medications  (ICD-V58.69) 5)  Dyspnea  (ICD-786.05) 6)  Leg Pain  (ICD-729.5) 7)  Tobacco Abuse  (ICD-305.1) 8)  Hypertension  (ICD-401.9) 9)  Hyperlipidemia  (ICD-272.4) 10)  Chest Pain  (ICD-786.50) 11)  Preventive Health Care  (ICD-V70.0) 12)  Cardiomyopathy, Secondary  (ICD-425.9) 13)  Coronary Atherosclerosis Native Coronary Artery  (ICD-414.01) 14)  Family History Diabetes 1st Degree Relative  (ICD-V18.0) 15)  Rheumatoid Arthritis  (ICD-714.0) 16)  Gerd  (ICD-530.81)  Current Medications (verified): 1)  Carvedilol 12.5 Mg Tabs (Carvedilol) .... Take One Tablet By Mouth Twice A Day 2)  Simvastatin 40 Mg Tabs (Simvastatin) .... One Tab By Mouth Everyday 3)  Cvs Aspirin 325 Mg Tabs (Aspirin) .... One Daily 4)  Hydrochlorothiazide 25 Mg  Tabs (Hydrochlorothiazide) .... 1/2 Tab By Mouth Once Daily 5)  Benicar 20 Mg Tabs (Olmesartan Medoxomil) .... Take 1 Tablet By Mouth Once A Day  Allergies (verified): No Known Drug Allergies  Physical Exam  General:  alert, well-developed, and well-nourished.   Head:  normocephalic and atraumatic.   Neck:  no jvd  Lungs:  decreased breath sounds bilat no wheezing  no rales  Heart:  normal rate and regular rhythm.   Abdomen:  soft, normal bowel sounds, and no hepatomegaly.   Extremities:  no edema Neurologic:  alert & oriented X3 and cranial nerves II-XII intact.   Psych:  normally interactive.     Impression & Recommendations:  Problem # 1:  PERIPHERAL VASCULAR DISEASE (ICD-443.9) likely cause of leg pain referral to PV pending encouraged her to continue  exercising as this may help cont ASA  Problem # 2:  HYPERTENSION (ICD-401.9)  much improved check bmet today as she needs repeat  Her updated medication list for this problem includes:    Carvedilol 12.5 Mg Tabs (Carvedilol) .[name]... Take one tablet by mouth twice a day    Hydrochlorothiazide 25 Mg Tabs (Hydrochlorothiazide) .[name]... 1/2 tab by mouth once daily    Benicar 20 Mg Tabs (Olmesartan medoxomil) .[name]... Take 1 tablet by mouth once a day  Orders: T-Basic Metabolic Panel [phone])  Problem # 3:  DYSPNEA (ICD-786.05) EF unchanged + smoking hx suspect she has COPD PFTs pending go ahead and start proventil add spiriva after PFTs (if indicated)  Problem # 4:  TOBACCO ABUSE (ICD-305.1) advised to quit try nicotine patches  Problem # 5:  HYPERLIPIDEMIA (ICD-272.4) ok to restart simvastatin not the cause of her leg pain recent lipid panel looked optimal  Her updated medication list for this problem includes:    Simvastatin 40 Mg Tabs (Simvastatin) ..... One tab by mouth everyday  Problem # 6:  CARDIOMYOPATHY, SECONDARY (ICD-425.9) continue BB and ARB f/u with cardiology  Problem # 7:  RHEUMATOID ARTHRITIS (ICD-714.0) no evidence of synovitis referral to rheumatology pending  Her updated medication list for this problem includes:    Cvs Aspirin 325 Mg Tabs (Aspirin) ..... One daily  Problem # 8:  DEPRESSION (ICD-311)  PHQ9=13 today discussed starting SSRI with patient but she wouldl like to avoid refer to LCSW for counseling  Orders: Psychology Referral (Psychology)  Complete Medication List: 1)  Carvedilol 12.5 Mg Tabs (Carvedilol) .... Take one tablet by mouth twice a day 2)  Simvastatin 40 Mg Tabs (Simvastatin) .... One tab by mouth everyday 3)  Cvs Aspirin 325 Mg Tabs (Aspirin) .... One daily 4)  Hydrochlorothiazide 25 Mg Tabs (Hydrochlorothiazide) .... 1/2 tab by mouth once daily 5)  Benicar 20 Mg Tabs (Olmesartan medoxomil) .... Take 1 tablet by mouth  once a day 6)  Proventil Hfa 108 (90 Base) Mcg/act Aers (Albuterol sulfate) .[name].. 1-2 puffs every 4-6 hours as needed  Patient Instructions: 1)  Restart your Simvastatin for your cholesterol. 2)  Schedule appointment with Ethelene Browns. 3)  Please schedule a follow-up appointment in 6 weeks with Adesuwa Osgood for 311.  4)  Tobacco is very bad for your health and your loved ones ! You should stop smoking !  5)  Stop smoking tips: Choose a quit date. Cut down before the quit date. Decide what you will do as a substitute when you feel the urge to smoke(gum, toothpick, exercise).  Prescriptions: PROVENTIL HFA 108 (90 BASE) MCG/ACT AERS (ALBUTEROL SULFATE) 1-2 puffs every 4-6 hours as needed  #  1 x 5   Entered and Authorized by:   [name] PA-C   Signed by:   [name] PA-C on 11/02/2009   Method used:   Faxed to ...       [facility] (retail)       [address] [place], [place]  [address]       Ph: [phone]       Fax: [phone]   RxID:   [id number]

## 2010-09-27 NOTE — Letter (Signed)
Summary: ORTHO NOTES  ORTHO NOTES   Imported By: Arta Bruce 01/18/2010 10:43:36  _____________________________________________________________________  External Attachment:    Type:   Image     Comment:   External Document

## 2010-09-27 NOTE — Miscellaneous (Signed)
Summary: Rehab Report//INITIAL SUMMARY  Rehab Report//INITIAL SUMMARY   Imported By: Arta Bruce 03/21/2010 10:36:05  _____________________________________________________________________  External Attachment:    Type:   Image     Comment:   External Document

## 2010-09-27 NOTE — Letter (Signed)
Summary: [facility]  [facility]   Imported By: Arta Bruce 01/03/2010 12:00:22  _____________________________________________________________________  External Attachment:    Type:   Image     Comment:   External Document

## 2010-09-27 NOTE — Progress Notes (Signed)
  Phone Note Outgoing Call   Summary of Call: SPOKE WITH PT REGARDING HER PV PROCEDURE AND SHE WANTS TO CALL us WHEN SHE IS READY TO SCHEDULE IT. ALSO STATED THAT SHE HAS BEEN REFERED TO A NEUROLOGIST REGARDING AN ABNOMAL CT SCAN.

## 2010-09-27 NOTE — Assessment & Plan Note (Signed)
Summary: bp check /tmm  Nurse Visit   Vital Signs:  Patient profile:   55 year old female Menstrual status:  postmenopausal Pulse rate:   64 / minute Pulse rhythm:   regular BP sitting:   110 / 73  (left arm) Cuff size:   regular  Vitals Entered By: Levon Hedger (December 21, 2009 2:18 PM)  Serial Vital Signs/Assessments:  Time      Position  BP       Pulse  Resp  Temp     By           R Arm     108/70                         Levon Hedger  CC: BP check...feeling fatigued. Comments Per Dr. Delrae Alfred pt's BP is great to continue taking medications as ordered.  Pt complains today of feeling fatigued and is ordered to return in 1 week for BP check.   Allergies: No Known Drug Allergies  Appended Document: bp check /tmm   Appended Document: bp check /tmm    Nurse Visit   Allergies: No Known Drug Allergies  Orders Added: 1)  Est. Patient Level I [57322]

## 2010-09-27 NOTE — Progress Notes (Signed)
Summary: test results  Phone Note Call from Patient Call back at [phone]   Summary of Call: The pt went to [facility] (vascular dept) and she states that they suppostly needs to send the results to her primary provider but she still has not received any feedback.  Can someone help her please? Alben Spittle PA-c Initial call taken by: Manon Hilding,  March 03, 2010 11:40 AM  Follow-up for Phone Call        Patient very worried about results -- would like provider to call her back with results.  Dutch Quint RN  March 03, 2010 12:22 PM   Additional Follow-up for Phone Call Additional follow up Details #1::        She has mild plaque buildup. No significant blockages. She needs to stay on medicine for cholesterol (simvastatin) and Aspirin. Nothing else to do. Nothing else to worry about. Continue with rehab.  Return sooner if symptoms change or worsen. Additional Follow-up by: [name] PA-C,  March 03, 2010 2:23 PM    Additional Follow-up for Phone Call Additional follow up Details #2::    She would also like to know the results of her chest xray and CT scan done at [facility] on June 24th.  Also would like a copy of low-fat/low cholesterol diet.  Dutch Quint RN  March 03, 2010 5:20 PM  Make sure she has appt with Neuro Physical Therapy for dizziness.  Referral made when she was here last.   [name] PA-C  March 04, 2010 3:01 PM  States she went to NPT yesterday and was told that she doesn't fit the criteria for vertigo, fit more the criteria of stroke, because of her balance.  Referred back to this provider for f/u.  Appt. 03/09/10 to discuss test results and further direction for care.      Follow-up by: Dutch Quint RN,  March 08, 2010 11:16 AM

## 2010-09-27 NOTE — Letter (Signed)
Summary: TEST ORDER FORM//MRI//APPT DATE & TIME  TEST ORDER FORM//MRI//APPT DATE & TIME   Imported By: Arta Bruce 03/09/2010 14:12:12  _____________________________________________________________________  External Attachment:    Type:   Image     Comment:   External Document

## 2010-09-27 NOTE — Progress Notes (Signed)
Summary: Call to [facility] ASAP/ Order Request  Phone Note From Other Clinic   Summary of Call: Silvestre Mesi from [facility] called in today because in the order shows that the pt needs MRI without contrast but usually needs to be with contrast.   It should say with in without contrast.  Can someone call her back asap because the pt at 10 am. [phone] / fax [phone] Alben Spittle PA-c Initial call taken by: Manon Hilding,  March 17, 2010 9:07 AM  Follow-up for Phone Call        on your desk Follow-up by: [name] PA-C,  March 17, 2010 11:01 AM  Additional Follow-up for Phone Call Additional follow up Details #1::        faxed Additional Follow-up by: Armenia Shannon,  March 18, 2010 3:38 PM

## 2010-11-08 ENCOUNTER — Telehealth (INDEPENDENT_AMBULATORY_CARE_PROVIDER_SITE_OTHER): Payer: Self-pay | Admitting: *Deleted

## 2010-11-13 LAB — COMPREHENSIVE METABOLIC PANEL
AST: 24 U/L (ref 0–37)
Alkaline Phosphatase: 72 U/L (ref 39–117)
Creatinine, Ser: 1.12 mg/dL (ref 0.4–1.2)
GFR calc non Af Amer: 50 mL/min — ABNORMAL LOW (ref >60)
Glucose, Bld: 102 mg/dL — ABNORMAL HIGH (ref 70–99)
Sodium: 138 mEq/L (ref 135–145)
Total Protein: 7.2 g/dL (ref 6.0–8.3)

## 2010-11-13 LAB — URINALYSIS, ROUTINE W REFLEX MICROSCOPIC
Nitrite: NEGATIVE
pH: 6 (ref 5.0–8.0)

## 2010-11-13 LAB — DIFFERENTIAL
Basophils Relative: 2 % — ABNORMAL HIGH (ref 0–1)
Eosinophils Absolute: 0.2 10*3/uL (ref 0.0–0.7)
Eosinophils Relative: 3 % (ref 0–5)
Monocytes Absolute: 0.5 10*3/uL (ref 0.1–1.0)
Neutro Abs: 3.7 10*3/uL (ref 1.7–7.7)
Neutrophils Relative %: 48 % (ref 43–77)

## 2010-11-13 LAB — TROPONIN I: Troponin I: 0.02 ng/mL (ref 0.00–0.06)

## 2010-11-13 LAB — CK TOTAL AND CKMB (NOT AT ARMC)
Relative Index: 1.2 (ref 0.0–2.5)
Total CK: 100 U/L (ref 7–177)

## 2010-11-13 LAB — CBC
HCT: 44.4 % (ref 36.0–46.0)
Hemoglobin: 14.7 g/dL (ref 12.0–15.0)
MCH: 28.1 pg (ref 26.0–34.0)
MCHC: 33.2 g/dL (ref 30.0–36.0)
RDW: 14.3 % (ref 11.5–15.5)

## 2010-11-15 NOTE — Progress Notes (Signed)
  Request received from [facility] sent to [facility]  November 08, 2010 9:11 AM

## 2010-11-28 LAB — BASIC METABOLIC PANEL WITH GFR
BUN: 10 mg/dL (ref 6–23)
BUN: 7 mg/dL (ref 6–23)
BUN: 8 mg/dL (ref 6–23)
CO2: 25 meq/L (ref 19–32)
CO2: 26 meq/L (ref 19–32)
CO2: 27 meq/L (ref 19–32)
Calcium: 9.1 mg/dL (ref 8.4–10.5)
Calcium: 9.3 mg/dL (ref 8.4–10.5)
Calcium: 9.4 mg/dL (ref 8.4–10.5)
Chloride: 104 meq/L (ref 96–112)
Chloride: 107 meq/L (ref 96–112)
Chloride: 108 meq/L (ref 96–112)
Creatinine, Ser: 1.08 mg/dL (ref 0.4–1.2)
Creatinine, Ser: 1.09 mg/dL (ref 0.4–1.2)
Creatinine, Ser: 1.15 mg/dL (ref 0.4–1.2)
GFR calc non Af Amer: 49 mL/min — ABNORMAL LOW
GFR calc non Af Amer: 52 mL/min — ABNORMAL LOW
GFR calc non Af Amer: 53 mL/min — ABNORMAL LOW
Glucose, Bld: 101 mg/dL — ABNORMAL HIGH (ref 70–99)
Glucose, Bld: 109 mg/dL — ABNORMAL HIGH (ref 70–99)
Glucose, Bld: 94 mg/dL (ref 70–99)
Potassium: 4 meq/L (ref 3.5–5.1)
Potassium: 4.1 meq/L (ref 3.5–5.1)
Potassium: 4.4 meq/L (ref 3.5–5.1)
Sodium: 138 meq/L (ref 135–145)
Sodium: 140 meq/L (ref 135–145)
Sodium: 141 meq/L (ref 135–145)

## 2010-11-28 LAB — DIFFERENTIAL
Basophils Absolute: 0.1 10*3/uL (ref 0.0–0.1)
Basophils Absolute: 0.1 K/uL (ref 0.0–0.1)
Basophils Relative: 1 % (ref 0–1)
Basophils Relative: 1 % (ref 0–1)
Basophils Relative: 2 % — ABNORMAL HIGH (ref 0–1)
Eosinophils Absolute: 0.1 K/uL (ref 0.0–0.7)
Eosinophils Absolute: 0.2 10*3/uL (ref 0.0–0.7)
Eosinophils Relative: 2 % (ref 0–5)
Eosinophils Relative: 3 % (ref 0–5)
Lymphocytes Relative: 29 % (ref 12–46)
Lymphs Abs: 2 K/uL (ref 0.7–4.0)
Monocytes Absolute: 0.4 K/uL (ref 0.1–1.0)
Monocytes Absolute: 0.5 10*3/uL (ref 0.1–1.0)
Monocytes Relative: 6 % (ref 3–12)
Neutro Abs: 2.9 10*3/uL (ref 1.7–7.7)
Neutro Abs: 4.4 K/uL (ref 1.7–7.7)
Neutrophils Relative %: 40 % — ABNORMAL LOW (ref 43–77)
Neutrophils Relative %: 62 % (ref 43–77)

## 2010-11-28 LAB — MAGNESIUM
Magnesium: 2 mg/dL (ref 1.5–2.5)
Magnesium: 2.3 mg/dL (ref 1.5–2.5)

## 2010-11-28 LAB — C-REACTIVE PROTEIN: CRP: 0.1 mg/dL — ABNORMAL LOW

## 2010-11-28 LAB — LIPID PANEL
HDL: 74 mg/dL
Total CHOL/HDL Ratio: 2.3 ratio
Triglycerides: 89 mg/dL
VLDL: 18 mg/dL (ref 0–40)

## 2010-11-28 LAB — POCT CARDIAC MARKERS
CKMB, poc: 3.7 ng/mL (ref 1.0–8.0)
Myoglobin, poc: 103 ng/mL (ref 12–200)
Troponin i, poc: 0.76 ng/mL (ref 0.00–0.09)

## 2010-11-28 LAB — RAPID URINE DRUG SCREEN, HOSP PERFORMED
Barbiturates: NOT DETECTED
Opiates: NOT DETECTED
Tetrahydrocannabinol: POSITIVE — AB

## 2010-11-28 LAB — PROTIME-INR
INR: 0.99 (ref 0.00–1.49)
Prothrombin Time: 13 s (ref 11.6–15.2)

## 2010-11-28 LAB — CK TOTAL AND CKMB (NOT AT ARMC)
CK, MB: 11.6 ng/mL — ABNORMAL HIGH (ref 0.3–4.0)
CK, MB: 13.6 ng/mL — ABNORMAL HIGH (ref 0.3–4.0)
Relative Index: 7.6 — ABNORMAL HIGH (ref 0.0–2.5)
Relative Index: 9.3 — ABNORMAL HIGH (ref 0.0–2.5)
Relative Index: 9.4 — ABNORMAL HIGH (ref 0.0–2.5)
Total CK: 125 U/L (ref 7–177)
Total CK: 144 U/L (ref 7–177)

## 2010-11-28 LAB — TSH: TSH: 0.927 u[IU]/mL (ref 0.350–4.500)

## 2010-11-28 LAB — CBC
HCT: 38.2 % (ref 36.0–46.0)
HCT: 40.6 % (ref 36.0–46.0)
HCT: 45.2 % (ref 36.0–46.0)
Hemoglobin: 12.8 g/dL (ref 12.0–15.0)
MCHC: 33.5 g/dL (ref 30.0–36.0)
MCV: 85.1 fL (ref 78.0–100.0)
MCV: 85.1 fL (ref 78.0–100.0)
Platelets: 269 K/uL (ref 150–400)
Platelets: 305 10*3/uL (ref 150–400)
Platelets: 350 10*3/uL (ref 150–400)
RBC: 4.49 MIL/uL (ref 3.87–5.11)
RDW: 13.5 % (ref 11.5–15.5)
RDW: 14 % (ref 11.5–15.5)
RDW: 14 % (ref 11.5–15.5)
WBC: 7.7 K/uL (ref 4.0–10.5)

## 2010-11-28 LAB — PHOSPHORUS
Phosphorus: 3.9 mg/dL (ref 2.3–4.6)
Phosphorus: 4 mg/dL (ref 2.3–4.6)

## 2010-11-28 LAB — TROPONIN I

## 2010-11-28 LAB — SEDIMENTATION RATE: Sed Rate: 3 mm/h (ref 0–22)

## 2011-08-03 ENCOUNTER — Other Ambulatory Visit: Payer: Self-pay | Admitting: *Deleted

## 2011-08-03 ENCOUNTER — Encounter: Payer: Self-pay | Admitting: Physician Assistant

## 2011-08-03 ENCOUNTER — Ambulatory Visit (INDEPENDENT_AMBULATORY_CARE_PROVIDER_SITE_OTHER): Payer: Medicare Other | Admitting: Physician Assistant

## 2011-08-03 DIAGNOSIS — E785 Hyperlipidemia, unspecified: Secondary | ICD-10-CM

## 2011-08-03 DIAGNOSIS — I2510 Atherosclerotic heart disease of native coronary artery without angina pectoris: Secondary | ICD-10-CM

## 2011-08-03 DIAGNOSIS — F17200 Nicotine dependence, unspecified, uncomplicated: Secondary | ICD-10-CM

## 2011-08-03 DIAGNOSIS — I429 Cardiomyopathy, unspecified: Secondary | ICD-10-CM

## 2011-08-03 DIAGNOSIS — I10 Essential (primary) hypertension: Secondary | ICD-10-CM

## 2011-08-03 DIAGNOSIS — R634 Abnormal weight loss: Secondary | ICD-10-CM

## 2011-08-03 LAB — BASIC METABOLIC PANEL
BUN: 11 mg/dL (ref 6–23)
CO2: 28 mEq/L (ref 19–32)
Calcium: 10 mg/dL (ref 8.4–10.5)
Creatinine, Ser: 1.1 mg/dL (ref 0.4–1.2)

## 2011-08-03 LAB — T4, FREE: Free T4: 0.91 ng/dL (ref 0.60–1.60)

## 2011-08-03 MED ORDER — PRAVASTATIN SODIUM 20 MG PO TABS: 20.0000 mg | ORAL_TABLET | Freq: Every evening | ORAL | Status: DC

## 2011-08-03 MED ORDER — OLMESARTAN MEDOXOMIL-HCTZ 40-12.5 MG PO TABS: 1.0000 | ORAL_TABLET | Freq: Every day | ORAL | Status: DC

## 2011-08-03 MED ORDER — ASPIRIN EC 81 MG PO TBEC: 81.0000 mg | DELAYED_RELEASE_TABLET | Freq: Every day | ORAL | Status: DC

## 2011-08-03 NOTE — Patient Instructions (Addendum)
Your physician recommends that you schedule a follow-up appointment in: 6 MONTHS WITH DR. CRENSHAW PER SCOTT WEAVER, PA-C.  Your physician has recommended you make the following change in your medication: INCREASE BENICAR HCTZ 40/12.5 MG DAILY A PAPER PRESCRIPTION HAS BEEN GIVEN TO YOU TODAY; START PRAVASTATIN 20 MG 1 TABLET EVERY NIGHT A PAPER PRESCRIPITION; STAY ON COREG 3.125 MG TWICE DAILY, START COATED ASPIRIN 81 MG DAILY  Your physician recommends that you return for lab work in: TODAY A BMET, TSH, FREE T4, FREE T3 DX WEIGHT LOSS, 401.1 HTN  Your physician recommends that you return for lab work in: 6-8 WEEKS WITH FASTING LIPID/LIVER PANEL 272.4 HYPERLIPIDEMIA  You have been referred to DR. LESCHBER FOR WEIGHT LOSS    REPEAT BMET 08/11/11 DUE TO INCREASE IN BENICAR/HCT 40/12.5

## 2011-08-03 NOTE — Telephone Encounter (Signed)
Pt notified that she can get pravastatin 20 mg @ [facility] for 90 dys for 10$, pt states this would be fine, so I called in a rx for pravastatin 20 mg 1 tab qhs # 90 x 3, pt will pick up tomorrow 08/04/11. Danielle Rankin

## 2011-08-03 NOTE — Assessment & Plan Note (Signed)
We discussed the need to quit.  

## 2011-08-03 NOTE — Assessment & Plan Note (Signed)
No angina.  I encouraged her to restart aspirin 81 mg a day.  She needs to be on a statin and I will restart that.  Followup with Dr. Jens Som in 6 months.

## 2011-08-03 NOTE — Assessment & Plan Note (Signed)
Start pravastatin 20 mg q.h.s.  Check lipids and LFTs in 6-8 weeks.

## 2011-08-03 NOTE — Assessment & Plan Note (Signed)
Uncontrolled.  Change Benicar to 40/12.5 mg daily.  Check a basic metabolic panel in one week.

## 2011-08-03 NOTE — Progress Notes (Signed)
[address] [place], [place]  [address] Phone: [phone] Fax:  [phone]  Date:  08/03/2011   Name:  Mercedes [name]       DOB:  1956/03/27 MRN:  [id number]  PCP:  Dala Dock  Primary Cardiologist:  Dr. Olga Millers    History of Present Illness: Mercedes [name] is a 55 y.o. female who presents for follow up.  She is a previous patient of mine at [facility].  She was admitted to [facility] with complaints of chest pain in Dec 2010. Her enzymes were positive. A cardiac catheterization was performed on August 16, 2009. There was a 40% LAD but no obstructive disease was noted. The ejection fraction was 50% and there was apical ballooning. This was felt consistent with Tako-Tsubo cardiomyopathy. Note the renal arteries were patent. Followup echocardiogram in February of 2011 revealed an ejection fraction of 50-55% with mild apical hypokinesis. There was mild mitral regurgitation. Carotid Dopplers in June of 2011 were negative. MRA in July of 2011 secondary to dizziness was also negative for carotid disease. She has been seen by Dr. [name] for abnormal ABIs and claudication. Peripheral arteriogram was scheduled but was canceled as her mother was ill and never rescheduled.   She was recently seen by her new PCP at [facility].  There was a question of whether or not she had a thyroid disorder.  This was checked because of weight loss.  She had conflicting reports on what her thyroid levels were.  She became frustrated and decided to seek me out so that she can be referred to a new primary care doctor.  She denies chest pain, shortness of breath, syncope, orthopnea, PND, edema, palpitations, leg pain.  Past Medical History  Diagnosis Date  . CAD (coronary artery disease)     NSTEMI 12/10: LHC - oD2 40%, LAD irreg., EF 50% with apical AK (tako-tsubo CM)  . Takotsubo cardiomyopathy 12.2010    f/u echo 09/2009: EF 50-55%, mild LVH, mod diast dysfxn,  mild apical HK  . HTN (hypertension)   . HLD (hyperlipidemia)   . PAD (peripheral artery disease)     eval by Dr. [name] in past but patient canceled angiogram  . RA (rheumatoid arthritis)   . GERD (gastroesophageal reflux disease)   . LBP (low back pain)   . Dizziness     ? CVA 01/2010 - carotid dopplers with no ICA stenosis  . Arnold-Chiari malformation, type I     MRI 02/2010  . Bradycardia   . Tobacco abuse     Current Outpatient Prescriptions  Medication Sig Dispense Refill  . carvedilol (COREG) 3.125 MG tablet Take 3.125 mg by mouth 2 (two) times daily with a meal.        . olmesartan-hydrochlorothiazide (BENICAR HCT) 20-12.5 MG per tablet Take 1 tablet by mouth daily.          Allergies: No Known Allergies  History  Substance Use Topics  . Smoking status: Current Everyday Smoker  . Smokeless tobacco: Not on file  . Alcohol Use:      ROS:  Please see the history of present illness.   All other systems reviewed and negative.   PHYSICAL EXAM: VS:  BP 170/90  Pulse 64  Ht 5\' 6"  (1.676 m)  Wt 120 lb (54.432 kg)  BMI 19.37 kg/m2 Repeat blood pressure by me 144/90 Well nourished, well developed, in no acute distress HEENT: normal Neck: no JVD Endocrine: No thyromegaly Cardiac:  normal  S1, S2; RRR; no murmur Lungs:  Decreased breath sounds bilaterally, no wheezing, rhonchi or rales Abd: soft, nontender, no hepatomegaly Ext: no edema Skin: warm and dry Neuro:  CNs 2-12 intact, no focal abnormalities noted  EKG:   Sinus rhythm, heart rate 64, left axis deviation, incomplete right bundle branch block, no ischemic changes  ASSESSMENT AND PLAN:

## 2011-08-03 NOTE — Assessment & Plan Note (Signed)
Previously noted recovery of LV function.  No signs or symptoms of congestive heart failure.  Continue ARB and beta blocker.

## 2011-08-03 NOTE — Assessment & Plan Note (Signed)
I will refer her to primary care.  Since there was a question of thyroid disease, I will obtain TFTs with her upcoming lab work.

## 2011-08-11 ENCOUNTER — Other Ambulatory Visit: Payer: Medicare Other | Admitting: *Deleted

## 2011-08-15 ENCOUNTER — Other Ambulatory Visit: Payer: Medicare Other | Admitting: *Deleted

## 2011-09-01 ENCOUNTER — Ambulatory Visit: Payer: Medicare Other | Admitting: Internal Medicine

## 2011-09-07 ENCOUNTER — Ambulatory Visit: Payer: Self-pay | Admitting: Cardiology

## 2011-09-08 ENCOUNTER — Ambulatory Visit (INDEPENDENT_AMBULATORY_CARE_PROVIDER_SITE_OTHER): Payer: Medicare Other | Admitting: Internal Medicine

## 2011-09-08 ENCOUNTER — Encounter: Payer: Self-pay | Admitting: Internal Medicine

## 2011-09-08 VITALS — BP 160/92 | HR 63 | Temp 98.0°F | Ht 66.0 in | Wt 124.1 lb

## 2011-09-08 DIAGNOSIS — F329 Major depressive disorder, single episode, unspecified: Secondary | ICD-10-CM

## 2011-09-08 DIAGNOSIS — E785 Hyperlipidemia, unspecified: Secondary | ICD-10-CM

## 2011-09-08 DIAGNOSIS — I10 Essential (primary) hypertension: Secondary | ICD-10-CM

## 2011-09-08 DIAGNOSIS — I2510 Atherosclerotic heart disease of native coronary artery without angina pectoris: Secondary | ICD-10-CM

## 2011-09-08 MED ORDER — MIRTAZAPINE 15 MG PO TABS: 15.0000 mg | ORAL_TABLET | Freq: Every day | ORAL | Status: DC

## 2011-09-08 NOTE — Progress Notes (Signed)
Subjective:    Patient ID: Mercedes [name], female    DOB: 1956/06/04, 56 y.o.   MRN: [id number]  HPI  New patient to me and our division, previously [facility] and known to our cardiology group -here today to establish care  Reviewed chronic medical issues: CAD - MI 07/2009 - transient tako-tsubo CM, resolved - no angina - variable med compliance with rx'd regimen (ASA, beta-blocker, ARB, statin)  hypertension - the patient reports variable compliance with medication(s) as prescribed. Denies adverse side effects.  Depression - hx same but denies prior med tx - increase in symptoms over past 3 months: anorexia, insomnia, fatigue and loss of interest in social activities - feels sad, easily stressed - worried about kids/g-kids - denies SI/HI  Past Medical History  Diagnosis Date  . CAD (coronary artery disease)     NSTEMI 12/10: LHC - oD2 40%, LAD irreg., EF 50% with apical AK (tako-tsubo CM)  . 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 PAD (peripheral artery disease)     eval by Dr. [name] in past but patient canceled angiogram  . RA (rheumatoid arthritis)   . GERD (gastroesophageal reflux disease)   . LBP (low back pain)   . Dizziness     ? CVA 01/2010 - carotid dopplers with no ICA stenosis  . Arnold-Chiari malformation, type I     MRI 02/2010  . Bradycardia   . Tobacco abuse   . DEPRESSION   . HYPERLIPIDEMIA   . HYPERTENSION    Family History  Problem Relation Age of Onset  . Arthritis Mother   . Arthritis Father    History  Substance Use Topics  . Smoking status: Current Everyday Smoker    Types: Cigarettes  . Smokeless tobacco: Not on file  . Alcohol Use: Yes   Review of Systems Constitutional: Negative for fever; positive for fatigue and anorexia - underweight? (goal 130#)  Respiratory: Negative for cough and shortness of breath.   Cardiovascular: Negative for chest pain or  palpitations.  Gastrointestinal: Negative for abdominal pain, no bowel changes.  Musculoskeletal: Negative for gait problem or joint swelling.  Skin: Negative for rash.  Neurological: Negative for dizziness or headache.  No other specific complaints in a complete review of systems (except as listed in HPI above).     Objective:   Physical Exam BP 160/92  Pulse 63  Temp(Src) 98 F (36.7 C) (Oral)  Ht 5\' 6"  (1.676 m)  Wt 124 lb 1.9 oz (56.3 kg)  BMI 20.03 kg/m2  SpO2 99% Wt Readings from Last 3 Encounters:  09/08/11 124 lb 1.9 oz (56.3 kg)  08/03/11 120 lb (54.432 kg)  06/08/10 128 lb (58.06 kg)   Constitutional: She is thin, appears well-developed and well-nourished. No distress.  HENT: Head: Normocephalic and atraumatic. Ears: B TMs ok, no erythema or effusion; Nose: Nose normal.  Mouth/Throat: Oropharynx is clear and moist. No oropharyngeal exudate.  Eyes: Conjunctivae and EOM are normal. Pupils are equal, round, and reactive to light. No scleral icterus.  Neck: Normal range of motion. Neck supple. No JVD present. No thyromegaly present.  Cardiovascular: Normal rate, regular rhythm and normal heart sounds.  No murmur heard. No BLE edema. Pulmonary/Chest: Effort normal and breath sounds normal. No respiratory distress. She has no wheezes.  Abdominal: Soft. Bowel sounds are normal. She exhibits no distension. There is no tenderness. no masses Musculoskeletal: Normal range of motion,  no joint effusions. No gross deformities Neurological: She is alert and oriented to person, place, and time. No cranial nerve deficit. Coordination normal.  Skin: Skin is warm and dry. No rash noted. No erythema.  Psychiatric: She has a dysphoric mood and affect. Her behavior is normal. Judgment and thought content normal.   Lab Results  Component Value Date   WBC 7.6 WHITE COUNT CONFIRMED ON SMEAR 02/18/2010   HGB 14.7 02/18/2010   HCT 44.4 02/18/2010   PLT 310 02/18/2010   GLUCOSE 95 08/03/2011    CHOL 158 10/13/2009   TRIG 93.0 10/13/2009   HDL 84.30 10/13/2009   LDLCALC 55 10/13/2009   ALT 23 02/18/2010   AST 24 02/18/2010   NA 138 08/03/2011   K 4.9 08/03/2011   CL 101 08/03/2011   CREATININE 1.1 08/03/2011   BUN 11 08/03/2011   CO2 28 08/03/2011   TSH 0.47 08/03/2011   INR 0.99 08/16/2009        Assessment & Plan:  See problem list. Medications and labs reviewed today.

## 2011-09-08 NOTE — Patient Instructions (Addendum)
It was good to see you today. We have reviewed your prior records including labs and tests today Start Remeron for depression/stress symptoms to help sleep, appetitive and mood - take at bedtime - Your prescription(s) have been submitted to your pharmacy. Please take as directed and contact our office if you believe you are having problem(s) with the medication(s). Other medications reviewed, no other changes at this time. Please schedule followup in 6-8 weeks to recheck depression symptoms, call sooner if problems. we will send to your prior provider(s) for "release of records" as discussed today -

## 2011-09-09 ENCOUNTER — Encounter: Payer: Self-pay | Admitting: Internal Medicine

## 2011-09-09 NOTE — Assessment & Plan Note (Addendum)
Hx NSTEMI 07/2011 - no anginal symptoms Reminded of need for med compliance and keep follow up with cards as ongoing

## 2011-09-09 NOTE — Assessment & Plan Note (Signed)
BP Readings from Last 3 Encounters:  09/08/11 160/92  08/03/11 170/90  06/08/10 120/78   subop control reviewed - variable med compliance - reviewed importance of med compliance recommended med compliance as rx;d and will re-check/adjust next visit if still uncontrolled on rx'd meds

## 2011-09-09 NOTE — Assessment & Plan Note (Signed)
Hx same - never on medications associated with weight loss, insomnia and anxiety/rumination - poor concentration Self medicating with "crown" - reviewed dangers of same with pt Start remeron and f/u symptoms and meds in 6 weeks, sooner if problems Verified no si/hi Pt offered counseling referral and declines - support offered

## 2011-09-09 NOTE — Assessment & Plan Note (Signed)
Recently started pravastatin 07/2011 - reviewed importance of compliance with coronary artery disease hx Following with cards for same Will check lipids next OV if not done at cards prior to that time (sched lab appt reviewed)

## 2011-09-14 ENCOUNTER — Telehealth: Payer: Self-pay | Admitting: Internal Medicine

## 2011-09-14 NOTE — Telephone Encounter (Signed)
Received copies from [facility],on 1.17.13. Forwarded 55 pages to Dr. Felicity Coyer ,for review.  sj

## 2011-09-18 ENCOUNTER — Other Ambulatory Visit (INDEPENDENT_AMBULATORY_CARE_PROVIDER_SITE_OTHER): Payer: Medicare Other | Admitting: *Deleted

## 2011-09-18 DIAGNOSIS — E785 Hyperlipidemia, unspecified: Secondary | ICD-10-CM

## 2011-09-18 DIAGNOSIS — I10 Essential (primary) hypertension: Secondary | ICD-10-CM

## 2011-09-18 LAB — BASIC METABOLIC PANEL
BUN: 11 mg/dL (ref 6–23)
CO2: 27 mEq/L (ref 19–32)
GFR: 63.04 mL/min (ref >60.00)
Glucose, Bld: 99 mg/dL (ref 70–99)
Potassium: 3.7 mEq/L (ref 3.5–5.1)

## 2011-09-18 LAB — LIPID PANEL
LDL Cholesterol: 81 mg/dL (ref 0–99)
Total CHOL/HDL Ratio: 2
VLDL: 13.8 mg/dL (ref 0.0–40.0)

## 2011-09-18 LAB — HEPATIC FUNCTION PANEL
ALT: 16 U/L (ref 0–35)
Bilirubin, Direct: 0 mg/dL (ref 0.0–0.3)
Total Bilirubin: 0.4 mg/dL (ref 0.3–1.2)

## 2011-10-25 ENCOUNTER — Ambulatory Visit: Payer: Medicare Other | Admitting: Internal Medicine

## 2012-04-04 ENCOUNTER — Telehealth: Payer: Self-pay | Admitting: Cardiology

## 2012-04-04 NOTE — Telephone Encounter (Signed)
New Problem:    I called to schedule an appointment with the patient.  When offered the option to schedule at this time, patient decline stating that she did not know her schedule.  When told to call back once she had a better understanding of her schedule, patient voiced consent.

## 2013-04-28 HISTORY — PX: CORONARY ANGIOPLASTY: SHX604

## 2013-04-29 ENCOUNTER — Emergency Department (HOSPITAL_COMMUNITY)
Admission: EM | Admit: 2013-04-29 | Discharge: 2013-04-29 | Disposition: A | Discharge status: Home / Self Care | Payer: Medicare Other | Attending: Emergency Medicine | Admitting: Emergency Medicine

## 2013-04-29 ENCOUNTER — Encounter (HOSPITAL_COMMUNITY): Payer: Self-pay | Admitting: Cardiology

## 2013-04-29 DIAGNOSIS — I2510 Atherosclerotic heart disease of native coronary artery without angina pectoris: Secondary | ICD-10-CM | POA: Insufficient documentation

## 2013-04-29 DIAGNOSIS — Z8639 Personal history of other endocrine, nutritional and metabolic disease: Secondary | ICD-10-CM | POA: Insufficient documentation

## 2013-04-29 DIAGNOSIS — Z8719 Personal history of other diseases of the digestive system: Secondary | ICD-10-CM | POA: Insufficient documentation

## 2013-04-29 DIAGNOSIS — Z8669 Personal history of other diseases of the nervous system and sense organs: Secondary | ICD-10-CM | POA: Insufficient documentation

## 2013-04-29 DIAGNOSIS — Z8659 Personal history of other mental and behavioral disorders: Secondary | ICD-10-CM | POA: Insufficient documentation

## 2013-04-29 DIAGNOSIS — F17200 Nicotine dependence, unspecified, uncomplicated: Secondary | ICD-10-CM | POA: Insufficient documentation

## 2013-04-29 DIAGNOSIS — L139 Bullous disorder, unspecified: Secondary | ICD-10-CM

## 2013-04-29 DIAGNOSIS — Z8739 Personal history of other diseases of the musculoskeletal system and connective tissue: Secondary | ICD-10-CM | POA: Insufficient documentation

## 2013-04-29 DIAGNOSIS — I10 Essential (primary) hypertension: Secondary | ICD-10-CM | POA: Insufficient documentation

## 2013-04-29 DIAGNOSIS — Z862 Personal history of diseases of the blood and blood-forming organs and certain disorders involving the immune mechanism: Secondary | ICD-10-CM | POA: Insufficient documentation

## 2013-04-29 MED ORDER — CLOBETASOL PROPIONATE 0.05 % EX LOTN: 3.0000 | TOPICAL_LOTION | Freq: Two times a day (BID) | CUTANEOUS | Status: DC

## 2013-04-29 NOTE — ED Notes (Signed)
Pt reports that she noticed a rash to bilateral hands about a week ago. States that she has tried creams and nothing has helped. Reports pain when she touches things.

## 2013-04-29 NOTE — ED Provider Notes (Signed)
CSN: [id number]     Arrival date & time 04/29/13  1701 History  This chart was scribed for non-physician practitioner, Arthor Captain, PA-C, working with Toy Baker, MD by Clydene Laming and Ardelia Mems, ED Scribes. This patient was seen in room TR06C/TR06C and the patient's care was started at 6:37 PM.   Chief Complaint  Patient presents with  . Rash    The history is provided by the patient. No language interpreter was used.    HPI Comments: Mercedes [name] is a 57 y.o. female who presents to the Emergency Department complaining of a rash and multiple blisters that began on her chest about 2 weeks ago, and has spread to her bilateral hands, feet and her back. She denies associated pain to the skin area of the blisters, but reports that she has burning pain underneath her skin. She reports that she has applied various OTC creams to the rash without relief. She denies taking any new medications. She states that she has not been exposed to any chemicals. She is also complaining of chronic back pain, but states that back pain is not the reason that she came to the ED today.  PCP- None   Past Medical History  Diagnosis Date  . CAD (coronary artery disease)     NSTEMI 12/10: LHC - oD2 40%, LAD irreg., EF 50% with apical AK (tako-tsubo CM)  . 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 PAD (peripheral artery disease)     eval by Dr. [name] in past but patient canceled angiogram  . RA (rheumatoid arthritis)   . GERD (gastroesophageal reflux disease)   . LBP (low back pain)   . Dizziness     ? CVA 01/2010 - carotid dopplers with no ICA stenosis  . Arnold-Chiari malformation, type I     MRI 02/2010  . Bradycardia   . Tobacco abuse   . DEPRESSION   . HYPERLIPIDEMIA   . HYPERTENSION    History reviewed. No pertinent past surgical history. Family History  Problem Relation Age of Onset  . Arthritis Mother   .  Arthritis Father    History  Substance Use Topics  . Smoking status: Current Every Day Smoker    Types: Cigarettes  . Smokeless tobacco: Not on file  . Alcohol Use: Yes   OB History   Grav Para Term Preterm Abortions TAB SAB Ect Mult Living                 Review of Systems A complete 10 system review of systems was obtained and all systems are negative except as noted in the HPI and PMH.   Allergies  Review of patient's allergies indicates no known allergies.  Home Medications  No current outpatient prescriptions on file.  Triage Vitals: BP 148/90  Pulse 82  Temp(Src) 98.1 F (36.7 C) (Oral)  Resp 18  SpO2 97%  Physical Exam  Nursing note and vitals reviewed. Constitutional: She is oriented to person, place, and time. She appears well-developed and well-nourished. No distress.  HENT:  Head: Normocephalic and atraumatic.  Eyes: EOM are normal.  Neck: Neck supple. No tracheal deviation present.  Cardiovascular: Normal rate.   Pulmonary/Chest: Effort normal. No respiratory distress.  Musculoskeletal: Normal range of motion.  Neurological: She is alert and oriented to person, place, and time.  Skin: Skin is warm and dry.  Psychiatric: She has a normal mood and  affect. Her behavior is normal.    ED Course  Procedures (including critical care time)  DIAGNOSTIC STUDIES: Oxygen Saturation is 97% on RA, normal by my interpretation.    COORDINATION OF CARE: 7:06 PM-Discussed treatment plan which includes consulting the attending provider, Dr. Freida Busman, and pt agrees.  Labs Review Labs Reviewed - No data to display  Imaging Review No results found.  MDM   1. Bullous dermatitis   Patient with bollous erruption .  Doubt Stevens-Johnson syndrome, doubt toxic epidermal Nepro lysis.  No signs of secondary infection.  Patient denies any recent changes in medications, soaps, lotions, detergents.  Patient will be discharged with clobetasol lotion topical to apply no more  than twice a day for 2 weeks.  She should follow up with community health and well-nourished regarding her long-term chronic health problems and with dermatology.    I personally performed the services described in this documentation, which was scribed in my presence. The recorded information has been reviewed and is accurate.      Arthor Captain, PA-C 04/29/13 1928

## 2013-04-29 NOTE — ED Notes (Signed)
Pt given a large ice pack per RN

## 2013-04-30 ENCOUNTER — Emergency Department (HOSPITAL_COMMUNITY)
Admission: EM | Admit: 2013-04-30 | Discharge: 2013-04-30 | Disposition: A | Discharge status: Home / Self Care | Payer: Medicare Other | Attending: Emergency Medicine | Admitting: Emergency Medicine

## 2013-04-30 ENCOUNTER — Emergency Department (HOSPITAL_COMMUNITY): Payer: Medicare Other

## 2013-04-30 ENCOUNTER — Encounter (HOSPITAL_COMMUNITY): Payer: Self-pay | Admitting: Emergency Medicine

## 2013-04-30 DIAGNOSIS — Z8719 Personal history of other diseases of the digestive system: Secondary | ICD-10-CM | POA: Insufficient documentation

## 2013-04-30 DIAGNOSIS — R42 Dizziness and giddiness: Secondary | ICD-10-CM | POA: Insufficient documentation

## 2013-04-30 DIAGNOSIS — I10 Essential (primary) hypertension: Secondary | ICD-10-CM | POA: Insufficient documentation

## 2013-04-30 DIAGNOSIS — Z79899 Other long term (current) drug therapy: Secondary | ICD-10-CM | POA: Insufficient documentation

## 2013-04-30 DIAGNOSIS — Z8739 Personal history of other diseases of the musculoskeletal system and connective tissue: Secondary | ICD-10-CM | POA: Insufficient documentation

## 2013-04-30 DIAGNOSIS — Z862 Personal history of diseases of the blood and blood-forming organs and certain disorders involving the immune mechanism: Secondary | ICD-10-CM | POA: Insufficient documentation

## 2013-04-30 DIAGNOSIS — Z8639 Personal history of other endocrine, nutritional and metabolic disease: Secondary | ICD-10-CM | POA: Insufficient documentation

## 2013-04-30 DIAGNOSIS — R059 Cough, unspecified: Secondary | ICD-10-CM | POA: Insufficient documentation

## 2013-04-30 DIAGNOSIS — Z8659 Personal history of other mental and behavioral disorders: Secondary | ICD-10-CM | POA: Insufficient documentation

## 2013-04-30 DIAGNOSIS — I2510 Atherosclerotic heart disease of native coronary artery without angina pectoris: Secondary | ICD-10-CM | POA: Insufficient documentation

## 2013-04-30 DIAGNOSIS — R21 Rash and other nonspecific skin eruption: Secondary | ICD-10-CM | POA: Insufficient documentation

## 2013-04-30 DIAGNOSIS — F17200 Nicotine dependence, unspecified, uncomplicated: Secondary | ICD-10-CM | POA: Insufficient documentation

## 2013-04-30 DIAGNOSIS — R05 Cough: Secondary | ICD-10-CM | POA: Insufficient documentation

## 2013-04-30 DIAGNOSIS — R0602 Shortness of breath: Secondary | ICD-10-CM | POA: Insufficient documentation

## 2013-04-30 NOTE — ED Notes (Signed)
Pt reports blisters on chest x 2 weeks, blisters increased in size and spread to most of body. Red raised spots on arm noted. Seen yesterday for spots on hands. Did not fill rx.

## 2013-04-30 NOTE — ED Provider Notes (Addendum)
Medical screening examination/treatment/procedure(s) were conducted as a shared visit with non-physician practitioner(s) and myself.  I personally evaluated the patient during the encounter  Patient's rash appreciated and does not appear to have any vesicles. Appears almost hive-like. Patient has been encouraged to DC dermatologist. She has refused to take the steroids that she has been off her  Toy Baker, MD 04/30/13 2308  Toy Baker, MD 05/08/13 [phone]

## 2013-04-30 NOTE — ED Provider Notes (Signed)
CSN: [id number]     Arrival date & time 04/30/13  1526 History  This chart was scribed for Trixie Dredge, [place], working with Toy Baker, MD by Blanchard Kelch, ED Scribe. This patient was seen in room WTR9/WTR9 and the patient's care was started at 4:14 PM.    Chief Complaint  Patient presents with  . Rash    total body rash with blisters    The history is provided by the patient. No language interpreter was used.    HPI Comments: Mercedes [name] is a 57 y.o. female who presents to the Emergency Department complaining of a worsening rash that began about two months ago around July 4th. The rash started as three or four small blisters that was temporarily relieved by hydrocortisone cream. However, the rash reappeared a few days later and has since spread to her shoulders and the rest of her body. About three days ago the patient noticed the blisters appearing on her fingers and face. Patient denies itching or baseline pain with the rash. However, pressure to the rash creates pain. Patient complains of mild dizziness, shortness of breath and coughing that began a week ago. She came to the ED at [facility] last night. She was discharged with Clobetasol propionate lotion for the rash but denies filling the prescription.  She reports a history night sweats that began prior to the rash. Patient denies traveling recently or noticing any tick bites. She also denies fever, chills, myalgia, diarrhea, vomiting, nausea, or abdominal pain. Patient denies history of liver problems, STD's or vaginal discharge. Patient has a medical history of rheumatoid arthritis.  Pt denies having a PCP currently   Past Medical History  Diagnosis Date  . CAD (coronary artery disease)     NSTEMI 12/10: LHC - oD2 40%, LAD irreg., EF 50% with apical AK (tako-tsubo CM)  . 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 PAD (peripheral artery disease)     eval by Dr.  [name] in past but patient canceled angiogram  . RA (rheumatoid arthritis)   . GERD (gastroesophageal reflux disease)   . LBP (low back pain)   . Dizziness     ? CVA 01/2010 - carotid dopplers with no ICA stenosis  . Arnold-Chiari malformation, type I     MRI 02/2010  . Bradycardia   . Tobacco abuse   . DEPRESSION   . HYPERLIPIDEMIA   . HYPERTENSION    History reviewed. No pertinent past surgical history. Family History  Problem Relation Age of Onset  . Arthritis Mother   . Arthritis Father   . Diabetes Other    History  Substance Use Topics  . Smoking status: Current Every Day Smoker    Types: Cigarettes  . Smokeless tobacco: Not on file  . Alcohol Use: Yes   OB History   Grav Para Term Preterm Abortions TAB SAB Ect Mult Living                 Review of Systems  Constitutional: Negative for fever and chills.  Respiratory: Positive for cough and shortness of breath.   Gastrointestinal: Negative for nausea, vomiting, abdominal pain and diarrhea.  Genitourinary: Negative for vaginal discharge.  Musculoskeletal: Negative for myalgias.  Skin: Positive for rash.  Neurological: Positive for dizziness.    Allergies  Review of patient's allergies indicates no known allergies.  Home Medications   Current Outpatient Rx  Name  Route  Sig  Dispense  Refill  . Clobetasol Propionate 0.05 % lotion   Topical   Apply 3 Squirts topically 2 (two) times daily.   59 mL   0    Triage Vitals: BP 168/102  Pulse 88  Temp(Src) 98.7 F (37.1 C) (Oral)  Resp 18  Wt 121 lb (54.885 kg)  BMI 19.54 kg/m2  SpO2 100%  Physical Exam  Nursing note and vitals reviewed. Constitutional: She appears well-developed and well-nourished. No distress.  HENT:  Head: Normocephalic and atraumatic.  Neck: Neck supple.  Pulmonary/Chest: Effort normal.  Neurological: She is alert.  Skin: Rash noted. She is not diaphoretic.  Erythematous papular rash over trunk, worst over upper chest.   Erythema over the tips of fingers and on the palms of hands with flattened bullous-appearing lesions over palms. Erythema of the palms. Soles of feet are spared. Very few lesions over lower extremities. Patient does have a violaceous patch over bridge of nose.    ED Course  Procedures (including critical care time)  DIAGNOSTIC STUDIES: Oxygen Saturation is 100% on room air, normal by my interpretation.    COORDINATION OF CARE:  4:24 PM- Will order chest x-ray, CMP, CBC and RPR. Patient verbalizes understanding and agrees with treatment plan.  4:32 PM Discussed pt with Dr Freida Busman who will also see the patient.   Labs Review Labs Reviewed - No data to display Imaging Review No results found. 5:06 PM Pt has been seen by Dr Freida Busman who agrees this may in fact be autoimmune.  Pt has decided she would prefer to be d/c without any tests and will follow up in a derm clinic at [facility] as recommended by Dr Freida Busman.  MDM   1. Rash    Patient with rash of unknown etiology presenting today in the ED. it is unclear what is causing this rash. It is not pruritic. It does not appear to be infectious. It may be an autoimmune-related rash. The systemic symptoms pt has appear to be very mild (SOB, offered as an afterthought during ROS.  O2 is 100% on room air, no tachypnea) I did order multiple labs and a chest x-ray which patient declined. Patient was also seen by Dr. Freida Busman who recommended close dermatology followup. Patient elected to follow up with dermatology instead of having any workup done here because of cost. Patient declined prednisone which we offered as a trial for her rash.   I personally performed the services described in this documentation, which was scribed in my presence. The recorded information has been reviewed and is accurate.    Trixie Dredge, PA-C 04/30/13 1759

## 2013-05-08 NOTE — ED Provider Notes (Signed)
Medical screening examination/treatment/procedure(s) were performed by non-physician practitioner and as supervising physician I was immediately available for consultation/collaboration.  Cypress Hinkson T Tatelyn Vanhecke, MD 05/08/13 0519 

## 2013-05-12 LAB — BASIC METABOLIC PANEL
BUN: 6 mg/dL (ref 4–21)
Creatinine: 0.8 mg/dL (ref 0.5–1.1)
Glucose: 100 mg/dL
Potassium: 4.3 mmol/L (ref 3.4–5.3)
Sodium: 138 mmol/L (ref 137–147)

## 2013-05-12 LAB — HEPATIC FUNCTION PANEL
ALT: 12 U/L (ref 7–35)
Alkaline Phosphatase: 97 U/L (ref 25–125)

## 2013-05-12 LAB — CBC AND DIFFERENTIAL: Hemoglobin: 15.3 g/dL (ref 12.0–16.0)

## 2013-05-19 ENCOUNTER — Encounter (HOSPITAL_COMMUNITY)
Admission: EM | Disposition: A | Discharge status: Home / Self Care | Payer: Medicare Other | Source: Home / Self Care | Attending: Cardiovascular Disease

## 2013-05-19 ENCOUNTER — Encounter (HOSPITAL_COMMUNITY): Payer: Self-pay | Admitting: Emergency Medicine

## 2013-05-19 ENCOUNTER — Ambulatory Visit (HOSPITAL_COMMUNITY): Admit: 2013-05-19 | Payer: Medicare Other | Admitting: Cardiovascular Disease

## 2013-05-19 ENCOUNTER — Inpatient Hospital Stay (HOSPITAL_COMMUNITY)
Admission: EM | Admit: 2013-05-19 | Discharge: 2013-05-21 | DRG: 247 | Disposition: A | Discharge status: Home / Self Care | Payer: Medicare Other | Attending: Cardiovascular Disease | Admitting: Cardiovascular Disease

## 2013-05-19 ENCOUNTER — Emergency Department (HOSPITAL_COMMUNITY): Payer: Medicare Other

## 2013-05-19 DIAGNOSIS — F3289 Other specified depressive episodes: Secondary | ICD-10-CM | POA: Diagnosis present

## 2013-05-19 DIAGNOSIS — E785 Hyperlipidemia, unspecified: Secondary | ICD-10-CM

## 2013-05-19 DIAGNOSIS — I4729 Other ventricular tachycardia: Secondary | ICD-10-CM | POA: Diagnosis not present

## 2013-05-19 DIAGNOSIS — I472 Ventricular tachycardia, unspecified: Secondary | ICD-10-CM | POA: Diagnosis not present

## 2013-05-19 DIAGNOSIS — R9431 Abnormal electrocardiogram [ECG] [EKG]: Secondary | ICD-10-CM

## 2013-05-19 DIAGNOSIS — I2510 Atherosclerotic heart disease of native coronary artery without angina pectoris: Secondary | ICD-10-CM

## 2013-05-19 DIAGNOSIS — I214 Non-ST elevation (NSTEMI) myocardial infarction: Secondary | ICD-10-CM

## 2013-05-19 DIAGNOSIS — K219 Gastro-esophageal reflux disease without esophagitis: Secondary | ICD-10-CM | POA: Diagnosis present

## 2013-05-19 DIAGNOSIS — Z87891 Personal history of nicotine dependence: Secondary | ICD-10-CM

## 2013-05-19 DIAGNOSIS — Z955 Presence of coronary angioplasty implant and graft: Secondary | ICD-10-CM

## 2013-05-19 DIAGNOSIS — I10 Essential (primary) hypertension: Secondary | ICD-10-CM | POA: Diagnosis present

## 2013-05-19 DIAGNOSIS — I498 Other specified cardiac arrhythmias: Secondary | ICD-10-CM | POA: Diagnosis present

## 2013-05-19 DIAGNOSIS — E781 Pure hyperglyceridemia: Secondary | ICD-10-CM | POA: Diagnosis present

## 2013-05-19 DIAGNOSIS — Z79899 Other long term (current) drug therapy: Secondary | ICD-10-CM

## 2013-05-19 DIAGNOSIS — I252 Old myocardial infarction: Secondary | ICD-10-CM

## 2013-05-19 DIAGNOSIS — Z8673 Personal history of transient ischemic attack (TIA), and cerebral infarction without residual deficits: Secondary | ICD-10-CM

## 2013-05-19 DIAGNOSIS — M069 Rheumatoid arthritis, unspecified: Secondary | ICD-10-CM | POA: Diagnosis present

## 2013-05-19 DIAGNOSIS — I739 Peripheral vascular disease, unspecified: Secondary | ICD-10-CM | POA: Diagnosis present

## 2013-05-19 DIAGNOSIS — I2119 ST elevation (STEMI) myocardial infarction involving other coronary artery of inferior wall: Principal | ICD-10-CM | POA: Diagnosis present

## 2013-05-19 DIAGNOSIS — I5181 Takotsubo syndrome: Secondary | ICD-10-CM | POA: Diagnosis present

## 2013-05-19 DIAGNOSIS — Z7982 Long term (current) use of aspirin: Secondary | ICD-10-CM

## 2013-05-19 DIAGNOSIS — F17200 Nicotine dependence, unspecified, uncomplicated: Secondary | ICD-10-CM

## 2013-05-19 DIAGNOSIS — F329 Major depressive disorder, single episode, unspecified: Secondary | ICD-10-CM | POA: Diagnosis present

## 2013-05-19 HISTORY — PX: LEFT HEART CATHETERIZATION WITH CORONARY ANGIOGRAM: SHX5451

## 2013-05-19 HISTORY — PX: PERCUTANEOUS CORONARY STENT INTERVENTION (PCI-S): SHX5485

## 2013-05-19 LAB — URINALYSIS, ROUTINE W REFLEX MICROSCOPIC
Glucose, UA: NEGATIVE mg/dL
Hgb urine dipstick: NEGATIVE
Ketones, ur: NEGATIVE mg/dL
Leukocytes, UA: NEGATIVE
Nitrite: NEGATIVE
Specific Gravity, Urine: 1.038 — ABNORMAL HIGH (ref 1.005–1.030)
pH: 6 (ref 5.0–8.0)

## 2013-05-19 LAB — CBC
HCT: 44.2 % (ref 36.0–46.0)
Hemoglobin: 15.4 g/dL — ABNORMAL HIGH (ref 12.0–15.0)
MCH: 27.2 pg (ref 26.0–34.0)
MCHC: 34.8 g/dL (ref 30.0–36.0)
MCV: 78 fL (ref 78.0–100.0)
Platelets: 352 10*3/uL (ref 150–400)
RBC: 5.67 MIL/uL — ABNORMAL HIGH (ref 3.87–5.11)
RDW: 13.7 % (ref 11.5–15.5)
WBC: 6.9 10*3/uL (ref 4.0–10.5)

## 2013-05-19 LAB — BASIC METABOLIC PANEL
BUN: 9 mg/dL (ref 6–23)
CO2: 24 mEq/L (ref 19–32)
Calcium: 9.4 mg/dL (ref 8.4–10.5)
Chloride: 100 mEq/L (ref 96–112)
Creatinine, Ser: 0.94 mg/dL (ref 0.50–1.10)
GFR calc Af Amer: 75 mL/min — ABNORMAL LOW (ref >90)
GFR calc non Af Amer: 65 mL/min — ABNORMAL LOW (ref >90)
Glucose, Bld: 120 mg/dL — ABNORMAL HIGH (ref 70–99)
Potassium: 3.6 mEq/L (ref 3.5–5.1)
Sodium: 136 mEq/L (ref 135–145)

## 2013-05-19 LAB — RAPID URINE DRUG SCREEN, HOSP PERFORMED
Barbiturates: NOT DETECTED
Benzodiazepines: POSITIVE — AB
Cocaine: NOT DETECTED

## 2013-05-19 LAB — MRSA PCR SCREENING: MRSA by PCR: NEGATIVE

## 2013-05-19 LAB — TROPONIN I
Troponin I: 3.57 ng/mL (ref <0.30)
Troponin I: 11.61 ng/mL (ref <0.30)

## 2013-05-19 LAB — POCT I-STAT TROPONIN I: Troponin i, poc: 2.36 ng/mL (ref 0.00–0.08)

## 2013-05-19 LAB — PRO B NATRIURETIC PEPTIDE: Pro B Natriuretic peptide (BNP): 1120 pg/mL — ABNORMAL HIGH (ref 0–125)

## 2013-05-19 SURGERY — LEFT HEART CATHETERIZATION WITH CORONARY ANGIOGRAM

## 2013-05-19 MED ORDER — NITROGLYCERIN 0.2 MG/ML ON CALL CATH LAB
INTRAVENOUS | Status: AC
  Filled 2013-05-19: qty 1

## 2013-05-19 MED ORDER — HEPARIN BOLUS VIA INFUSION
3500.0000 [IU] | Freq: Once | INTRAVENOUS | Status: AC
  Administered 2013-05-19: 3500 [IU] via INTRAVENOUS
  Filled 2013-05-19: qty 3500

## 2013-05-19 MED ORDER — CLOBETASOL PROPIONATE 0.05 % EX LOTN: 3.0000 | TOPICAL_LOTION | Freq: Two times a day (BID) | CUTANEOUS | Status: DC

## 2013-05-19 MED ORDER — ONDANSETRON HCL 4 MG/2ML IJ SOLN: 4.0000 mg | Freq: Four times a day (QID) | INTRAMUSCULAR | Status: DC | PRN

## 2013-05-19 MED ORDER — MORPHINE SULFATE 4 MG/ML IJ SOLN
4.0000 mg | Freq: Once | INTRAMUSCULAR | Status: AC
  Administered 2013-05-19: 4 mg via INTRAVENOUS
  Filled 2013-05-19: qty 1

## 2013-05-19 MED ORDER — NITROGLYCERIN IN D5W 200-5 MCG/ML-% IV SOLN
2.0000 ug/min | Freq: Once | INTRAVENOUS | Status: AC
  Administered 2013-05-19: 10 ug/min via INTRAVENOUS
  Filled 2013-05-19: qty 250

## 2013-05-19 MED ORDER — MIDAZOLAM HCL 2 MG/2ML IJ SOLN
INTRAMUSCULAR | Status: AC
  Filled 2013-05-19: qty 2

## 2013-05-19 MED ORDER — NITROGLYCERIN IN D5W 200-5 MCG/ML-% IV SOLN
2.0000 ug/min | INTRAVENOUS | Status: DC
  Administered 2013-05-19: 5 ug/min via INTRAVENOUS

## 2013-05-19 MED ORDER — SODIUM CHLORIDE 0.9 % IJ SOLN
3.0000 mL | Freq: Two times a day (BID) | INTRAMUSCULAR | Status: DC
  Administered 2013-05-20 – 2013-05-21 (×2): 3 mL via INTRAVENOUS

## 2013-05-19 MED ORDER — BIVALIRUDIN 250 MG IV SOLR
INTRAVENOUS | Status: AC
  Filled 2013-05-19: qty 250

## 2013-05-19 MED ORDER — TICAGRELOR 90 MG PO TABS
ORAL_TABLET | ORAL | Status: AC
  Filled 2013-05-19: qty 2

## 2013-05-19 MED ORDER — PREDNISONE 20 MG PO TABS: 20.0000 mg | ORAL_TABLET | Freq: Every day | ORAL | Status: DC

## 2013-05-19 MED ORDER — SODIUM CHLORIDE 0.9 % IJ SOLN: 3.0000 mL | INTRAMUSCULAR | Status: DC | PRN

## 2013-05-19 MED ORDER — HEPARIN (PORCINE) IN NACL 100-0.45 UNIT/ML-% IJ SOLN
650.0000 [IU]/h | INTRAMUSCULAR | Status: DC
  Administered 2013-05-19: 650 [IU]/h via INTRAVENOUS
  Filled 2013-05-19: qty 250

## 2013-05-19 MED ORDER — NITROGLYCERIN IN D5W 200-5 MCG/ML-% IV SOLN
INTRAVENOUS | Status: AC
  Filled 2013-05-19: qty 250

## 2013-05-19 MED ORDER — ONDANSETRON HCL 4 MG/2ML IJ SOLN
4.0000 mg | Freq: Once | INTRAMUSCULAR | Status: AC
  Administered 2013-05-19: 4 mg via INTRAVENOUS
  Filled 2013-05-19: qty 2

## 2013-05-19 MED ORDER — ATORVASTATIN CALCIUM 40 MG PO TABS
40.0000 mg | ORAL_TABLET | Freq: Every day | ORAL | Status: DC
  Administered 2013-05-20: 40 mg via ORAL
  Filled 2013-05-19 (×2): qty 1

## 2013-05-19 MED ORDER — VERAPAMIL HCL 2.5 MG/ML IV SOLN
INTRAVENOUS | Status: AC
  Filled 2013-05-19: qty 2

## 2013-05-19 MED ORDER — PREDNISONE 10 MG PO TABS
10.0000 mg | ORAL_TABLET | Freq: Every day | ORAL | Status: DC
  Administered 2013-05-21: 10 mg via ORAL
  Filled 2013-05-19 (×2): qty 1

## 2013-05-19 MED ORDER — HEPARIN (PORCINE) IN NACL 2-0.9 UNIT/ML-% IJ SOLN
INTRAMUSCULAR | Status: AC
  Filled 2013-05-19: qty 1500

## 2013-05-19 MED ORDER — PREDNISONE 20 MG PO TABS
20.0000 mg | ORAL_TABLET | ORAL | Status: AC
  Administered 2013-05-19: 20 mg via ORAL
  Filled 2013-05-19: qty 1

## 2013-05-19 MED ORDER — FENTANYL CITRATE 0.05 MG/ML IJ SOLN
INTRAMUSCULAR | Status: AC
  Filled 2013-05-19: qty 2

## 2013-05-19 MED ORDER — TICAGRELOR 90 MG PO TABS
90.0000 mg | ORAL_TABLET | Freq: Two times a day (BID) | ORAL | Status: DC
  Administered 2013-05-20 – 2013-05-21 (×3): 90 mg via ORAL
  Filled 2013-05-19 (×4): qty 1

## 2013-05-19 MED ORDER — ACETAMINOPHEN 325 MG PO TABS
650.0000 mg | ORAL_TABLET | ORAL | Status: DC | PRN
  Filled 2013-05-19: qty 2

## 2013-05-19 MED ORDER — ASPIRIN 81 MG PO CHEW
324.0000 mg | CHEWABLE_TABLET | Freq: Once | ORAL | Status: AC
Start: 2013-05-19 — End: 2013-05-19
  Administered 2013-05-19: 324 mg via ORAL
  Filled 2013-05-19: qty 4

## 2013-05-19 MED ORDER — PREDNISONE 20 MG PO TABS
20.0000 mg | ORAL_TABLET | Freq: Every day | ORAL | Status: AC
  Administered 2013-05-20: 20 mg via ORAL
  Filled 2013-05-19: qty 1

## 2013-05-19 MED ORDER — ASPIRIN 81 MG PO CHEW
81.0000 mg | CHEWABLE_TABLET | Freq: Every day | ORAL | Status: DC
  Administered 2013-05-20 – 2013-05-21 (×2): 81 mg via ORAL
  Filled 2013-05-19 (×2): qty 1

## 2013-05-19 MED ORDER — LIDOCAINE HCL (PF) 1 % IJ SOLN
INTRAMUSCULAR | Status: AC
  Filled 2013-05-19: qty 30

## 2013-05-19 MED ORDER — NITROGLYCERIN 0.4 MG SL SUBL
0.4000 mg | SUBLINGUAL_TABLET | SUBLINGUAL | Status: DC | PRN
  Administered 2013-05-19 (×2): 0.4 mg via SUBLINGUAL

## 2013-05-19 MED ORDER — SODIUM CHLORIDE 0.9 % IV SOLN: 1.0000 mL/kg/h | INTRAVENOUS | Status: DC

## 2013-05-19 MED ORDER — SODIUM CHLORIDE 0.9 % IV SOLN: 250.0000 mL | INTRAVENOUS | Status: DC | PRN

## 2013-05-19 NOTE — ED Provider Notes (Signed)
CSN: [id number]     Arrival date & time 05/19/13  1650 History   First MD Initiated Contact with Patient 05/19/13 1710     Chief Complaint  Patient presents with  . Chest Pain   (Consider location/radiation/quality/duration/timing/severity/associated sxs/prior Treatment) HPI  57yf with CP. Onset around 0300 this morning. Laying in bed at onset. Has waxed/waned through out day but not completely resolved. Hurts in center of chest with radiation to L neck and LUE. Mild SOB. No n/v/d. No fever or chills. No cough. No unusual leg pain or swelling. Past cardiac hx significant for admission Dec 2010 with CP. Troponin was elevated. A cardiac catheterization showed 40% LAD but no obstructive disease was noted. The ejection fraction was 50% and there was apical ballooning. This was felt consistent with Tako-Tsubo cardiomyopathy. It doesn't seem like she has been seen by cardiology recently. Some question per review of past records about medication compliance. Denies drug use. Smoker. Drinks regularly.    Past Medical History  Diagnosis Date  . CAD (coronary artery disease)     NSTEMI 12/10: LHC - oD2 40%, LAD irreg., EF 50% with apical AK (tako-tsubo CM)  . 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 PAD (peripheral artery disease)     eval by Dr. [name] in past but patient canceled angiogram  . RA (rheumatoid arthritis)   . GERD (gastroesophageal reflux disease)   . LBP (low back pain)   . Dizziness     ? CVA 01/2010 - carotid dopplers with no ICA stenosis  . Arnold-Chiari malformation, type I     MRI 02/2010  . Bradycardia   . Tobacco abuse   . DEPRESSION   . HYPERLIPIDEMIA   . HYPERTENSION    History reviewed. No pertinent past surgical history. Family History  Problem Relation Age of Onset  . Arthritis Mother   . Arthritis Father   . Diabetes Other    History  Substance Use Topics  . Smoking status: Current Every  Day Smoker    Types: Cigarettes  . Smokeless tobacco: Not on file  . Alcohol Use: Yes   OB History   Grav Para Term Preterm Abortions TAB SAB Ect Mult Living                 Review of Systems  All systems reviewed and negative, other than as noted in HPI.   Allergies  Review of patient's allergies indicates no known allergies.  Home Medications   Current Outpatient Rx  Name  Route  Sig  Dispense  Refill  . Clobetasol Propionate 0.05 % lotion   Topical   Apply 3 Squirts topically 2 (two) times daily.   59 mL   0    BP 174/97  Pulse 55  Temp(Src) 98 F (36.7 C) (Oral)  Resp 16  SpO2 99% Physical Exam  Nursing note and vitals reviewed. Constitutional: She appears well-developed.  Mildly uncomfortable appearing  HENT:  Head: Normocephalic and atraumatic.  Eyes: Conjunctivae are normal. Right eye exhibits no discharge. Left eye exhibits no discharge.  Neck: Neck supple.  Cardiovascular: Normal rate, regular rhythm and normal heart sounds.  Exam reveals no gallop and no friction rub.   No murmur heard. Pulmonary/Chest: Effort normal and breath sounds normal. No respiratory distress. She exhibits no tenderness.  Abdominal: Soft. She exhibits no distension. There is no tenderness.  Musculoskeletal: She exhibits no edema and no tenderness.  Lower extremities symmetric as compared to each other. No calf tenderness. Negative Homan's. No palpable cords.   Neurological: She is alert.  Skin: Skin is warm and dry. She is not diaphoretic.  Psychiatric: She has a normal mood and affect. Her behavior is normal. Thought content normal.    ED Course  Procedures (including critical care time)  CRITICAL CARE Performed by: Raeford Razor  Total critical care time: 35 minutes  Critical care time was exclusive of separately billable procedures and treating other patients. Critical care was necessary to treat or prevent imminent or life-threatening deterioration. Critical care  was time spent personally by me on the following activities: development of treatment plan with patient and/or surrogate as well as nursing, discussions with consultants, evaluation of patient's response to treatment, examination of patient, obtaining history from patient or surrogate, ordering and performing treatments and interventions, ordering and review of laboratory studies, ordering and review of radiographic studies, pulse oximetry and re-evaluation of patient's condition.   Labs Review Labs Reviewed  CBC - Abnormal; Notable for the following:    RBC 5.67 (*)    Hemoglobin 15.4 (*)    All other components within normal limits  POCT I-STAT TROPONIN I - Abnormal; Notable for the following:    Troponin i, poc 2.36 (*)    All other components within normal limits  BASIC METABOLIC PANEL  PRO B NATRIURETIC PEPTIDE  URINALYSIS, ROUTINE W REFLEX MICROSCOPIC  TROPONIN I   EKG:  Rhythm: sinus bradycardia Vent. rate 56 BPM PR interval 118 ms QRS duration 90 ms QT/QTc 436/420 ms LAD ST segments: Mild STE inferiorly with biphasic appearance in III, aVF Comparison: Inferior ST changes new from 07/2011  EKG: repeat Interval comparison: artifact, but overall fairly stable to initial    Imaging Review Dg Chest 2 View  05/19/2013   CLINICAL DATA:  Mid chest pain, left shoulder pain, bilateral jaw pain, history coronary artery disease, cardiomyopathy, rheumatoid arthritis, smoker, hypertension, hyperlipidemia  EXAM: CHEST  2 VIEW  COMPARISON:  02/18/2010  FINDINGS: Normal heart size, mediastinal contours, and pulmonary vascularity.  Emphysematous changes consistent with COPD.  No acute infiltrate, pleural effusion or pneumothorax.  Bones unremarkable.  IMPRESSION: COPD changes.  No acute abnormalities.   Electronically Signed   By: Ulyses Southward M.D.   On: 05/19/2013 17:27    MDM   1. NSTEMI (non-ST elevated myocardial infarction)   2. Abnormal EKG     57yF with CP. Seen in triage room  after reviewing EKG. EKG with mild non-diagnostic inferior STE w/o reciprocal changes. Discussed with and reviewed EKG with Dr Excell Seltzer, cardiology. Will further w/u in ED.   Trop elevated. Heparin ordered. Will discuss with cardiology.   Discussed with Dr Antoine Poche. Will eval in ED. Pt still having some CP, although improved. Will start nitro gtt.   Raeford Razor, MD 05/19/13 [phone]

## 2013-05-19 NOTE — Progress Notes (Addendum)
MD paged regarding HR drop from 60's to 44-45, and BP of 204/89 (135). Pt had a re-bleed at TR band on rt radial. TR band cc's increased to 19 at bedside by Zacarias Pontes, RN. Pt also c/o of a dull back pain around left shoulder blade rated 1 out of 10. Will continue to monitor.

## 2013-05-19 NOTE — CV Procedure (Signed)
Cardiac Catheterization Procedure Note  Name: Mercedes [name] MRN: [id number] DOB: Jul 18, 1956  Procedure: Left Heart Cath, Selective Coronary Angiography, LV angiography, PTCA and stenting of the Left Circumflex  Indication:  This is a 57 year old woman with a history of nonobstructive CAD by cardiac catheterization a few years ago. She continues to smoke cigarettes. She developed severe substernal chest pain at 2:00 in the morning. Her pain has been continuous since that time. At its worst it was 8/10 and now is 1/10. Her EKG demonstrates subtle inferior ST elevation concerning for acute injury but not diagnostic of STEMI. However, with ongoing mild chest discomfort and elevated cardiac markers, we elected to proceed with emergent cardiac catheterization.  Procedural Details:  The right wrist was prepped, draped, and anesthetized with 1% lidocaine. Using the modified Seldinger technique, a 5/6 French slender sheath was introduced into the right radial artery. 3 mg of verapamil was administered through the sheath, weight-based unfractionated heparin was administered intravenously. Standard Judkins catheters were used for selective coronary angiography and left ventriculography. Catheter exchanges were performed over an exchange length guidewire.  PROCEDURAL FINDINGS Hemodynamics: AO 149/65 LV 148/20   Coronary angiography: Coronary dominance: right  Left mainstem: Widely patent without obstructive disease. Arises from the left cusp.  Left anterior descending (LAD): The LAD is diffusely diseased. The mid LAD has diffuse 50% stenosis throughout the entirety of the vessel. The first diagonal has 40-50% stenosis in its proximal portion.  Left circumflex (LCx): The left circumflex demonstrates a 75% hypodense plaque just beyond the ostium. The AV circumflex is then a large caliber vessel. There is a single obtuse marginal that divides into multiple subbranches. 2 of the subbranches demonstrate  abrupt cessation of flow suggestive of an embolic event. The AV circumflex beyond the obtuse marginal origin is patent.  Right coronary artery (RCA): The RCA is dominant. The vessel is diffusely diseased. The proximal vessel demonstrates 50-60% stenosis. The mid RCA also demonstrates 50-60% stenosis at the area of the first marking marginal branch. The PDA and PLA branches are patent. There is a faint collateral visualized to the distal left circumflex distribution.  Left ventriculography: There is focal akinesis of the distal inferior wall near the left ventricular apex, typical of the left circumflex distribution infarct. The anterolateral, true apical, and basal and midinferior walls contract normally. The left ventricular ejection fraction is estimated at 55%. There is no significant mitral regurgitation.  PCI Note:  Following the diagnostic procedure, the decision was made to proceed with PCI. After careful review of her angiogram and left ventriculogram, I suspected the left circumflex was the "culprit vessel." The proximal left circumflex lesion appeared hemodynamically significant and also was angiographically hypodense. I suspected that this plaque embolized into the distal vessel. I felt that PCI was indicated to stabilize this plaque and treat the severe stenosis. The patient was loaded with brilinta. Weight-based bivalirudin was given for anticoagulation. Once a therapeutic ACT was achieved, a 6 [place] XB 3.0 cm guide catheter was inserted.  A cougar coronary guidewire was used to cross the lesion.  The lesion was then stented with a 3.5 x 16 mm promus premier drug-eluting stent.  The stent was postdilated with a 3.75 mm noncompliant balloon.  Following PCI, there was 0% residual stenosis and TIMI-3 flow. Final angiography confirmed an excellent result. The patient tolerated the procedure well. There were no immediate procedural complications. A TR band was used for radial hemostasis. The patient  was transferred to the post catheterization recovery  area for further monitoring.  PCI Data: Vessel - left circumflex/Segment - proximal Percent Stenosis (pre)  75 TIMI-flow 3 Stent 3.5 x 16 mm Promus premier DES Percent Stenosis (post) 0 TIMI-flow (post) 3  Final Conclusions:   1. Severe proximal left circumflex stenosis with distal occlusion of the obtuse marginal subbranch probably related to an embolic event from the proximal plaque 2. Moderate diffuse LAD stenosis 3. Moderate segmental RCA stenosis 4. Focal segmental left ventricular contraction abnormality consistent with a left circumflex distribution infarction with overall preserved left ventricular systolic function   Recommendations:  Recommend aspirin and brilinta for at least one year. Tobacco cessation will be important. Will continue Angiomax for 2 hours. No beta blocker acutely because of bradycardia with heart rate about 50 beats per minute. Will start a high dose statin drug and check lipids in the morning. If uncomplicated hospital course, would anticipate discharge home Wednesday, September 24.  Tonny Bollman 05/19/2013, 8:10 PM

## 2013-05-19 NOTE — Progress Notes (Signed)
Adolm Joseph, MD paged. Nitro drip ordered for BP control. MD aware of HR. Will continue to monitor.

## 2013-05-19 NOTE — Progress Notes (Signed)
Support for patient's daughters Fabian November and Leilani Merl when patient was in Cath Lab. Presence; listening. Shae told me that this is her mother's "second heart attack". She said her mother is very "secretive" and doesn't share much with her daughters. Both times she drove herself to the hospital and then called her daughters. Will continue to support family.

## 2013-05-19 NOTE — ED Notes (Signed)
Pt. reports mid chest pain with SOB onset this morning , slight SOB , denies nausea or diaphoresis . Pt. stated history of CAD - under the services of [facility] .

## 2013-05-19 NOTE — H&P (Signed)
CARDIOLOGY ADMISSION NOTE  Patient ID: Mercedes [name] MRN: [id number] DOB/AGE: January 30, 1956 57 y.o.  Admit date: 05/19/2013 Primary Physician   None Primary Cardiologist   Dr. Jens Som Chief Complaint    Chest pain  HPI:  Mercedes [name] is a 57 y.o.with a history of NQWMI in August 16, 2009. There was a 40% LAD but no obstructive disease was noted. The ejection fraction was 50% and there was apical ballooning. This was felt consistent with Tako-Tsubo cardiomyopathy. Note the renal arteries were patent. Followup echocardiogram in February of 2011 revealed an ejection fraction of 50-55% with mild apical hypokinesis. There was mild mitral regurgitation.   She presents now with chest pain.  She said that this started last night while she was asleep. She said her chest felt like it was on fire. Within the chest. There was some radiation to her jaw. Her left arm hurt as well. She could not get comfortable. He it was 10 out of 10. This was unlike her previous symptoms. She had not been having pain like this before. He finally presented to the emergency room today. She did have some mild changes on her EKG with inferior ST elevation. These persisted on followup EKG. Her pain is much improved but still present after sublingual nitroglycerin and morphine as well as heparin. She said prior to that she's been doing okay. She was not having chest pain prior to this. She does not having shortness of breath. He has not been having any palpitations, presyncope or syncope. He has not been having any PND or orthopnea.   Past Medical History  Diagnosis Date  . CAD (coronary artery disease)     NSTEMI 12/10: LHC - oD2 40%, LAD irreg., EF 50% with apical AK (tako-tsubo CM)  . 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 PAD (peripheral artery disease)     eval by Dr. Clifton Mercedes [name] in past but patient canceled angiogram  . RA (rheumatoid  arthritis)   . GERD (gastroesophageal reflux disease)   . LBP (low back pain)   . Dizziness     ? CVA 01/2010 - carotid dopplers with no ICA stenosis  . Arnold-Chiari malformation, type I     MRI 02/2010  . Bradycardia   . Tobacco abuse   . DEPRESSION   . HYPERLIPIDEMIA   . HYPERTENSION     History reviewed. No pertinent past surgical history.  No Known Allergies No current facility-administered medications on file prior to encounter.   Current Outpatient Prescriptions on File Prior to Encounter  Medication Sig Dispense Refill  . Clobetasol Propionate 0.05 % lotion Apply 3 Squirts topically 2 (two) times daily.  59 mL  0   History   Social History  . Marital Status: Single    Spouse Name: N/A    Number of Children: N/A  . Years of Education: N/A   Occupational History  . Not on file.   Social History Main Topics  . Smoking status: Current Every Day Smoker    Types: Cigarettes  . Smokeless tobacco: Not on file  . Alcohol Use: Yes  . Drug Use: No  . Sexual Activity: Not on file   Other Topics Concern  . Not on file   Social History Narrative  . No narrative on file    Family History  Problem Relation Age of Onset  . Arthritis Mother   . Arthritis Father   .  Diabetes Other     ROS:  Rash.  Otherwise as stated in the HPI and negative for all other systems.   Physical Exam: Blood pressure 190/90, pulse 52, temperature 98 F (36.7 C), temperature source Oral, resp. rate 18, height 5\' 7"  (1.702 m), weight 118 lb (53.524 kg), SpO2 97.00%.  GENERAL:  Well appearing HEENT:  Pupils equal round and reactive, fundi not visualized, oral mucosa unremarkable NECK:  No jugular venous distention, waveform within normal limits, carotid upstroke brisk and symmetric, no bruits, no thyromegaly LYMPHATICS:  No cervical, inguinal adenopathy LUNGS:  Clear to auscultation bilaterally BACK:  No CVA tenderness CHEST:  Unremarkable HEART:  PMI not displaced or sustained,S1 and S2  within normal limits, no S3, no S4, no clicks, no rubs, no murmurs ABD:  Flat, positive bowel sounds normal in frequency in pitch, no bruits, no rebound, no guarding, no midline pulsatile mass, no hepatomegaly, no splenomegaly EXT:  2 plus pulses throughout, no edema, no cyanosis no clubbing SKIN:  No rashes no nodules NEURO:  Cranial nerves II through XII grossly intact, motor grossly intact throughout PSYCH:  Cognitively intact, oriented to person place and time  Labs: Lab Results  Component Value Date   BUN 9 05/19/2013   Lab Results  Component Value Date   CREATININE 0.94 05/19/2013   Lab Results  Component Value Date   NA 136 05/19/2013   K 3.6 05/19/2013   CL 100 05/19/2013   CO2 24 05/19/2013    Lab Results  Component Value Date   WBC 6.9 05/19/2013   HGB 15.4* 05/19/2013   HCT 44.2 05/19/2013   MCV 78.0 05/19/2013   PLT 352 05/19/2013     Radiology:  CXR:  COPD changes.  EKG:   Normal sinus rhythm, rate 52, 1/2 mm ST elevation in leads II, III, aVF.    ASSESSMENT AND PLAN:    INFERIOR MI:  History EKG are consistent with an acute inferior MI. Enzymes are elevated and she has ongoing pain. She will be taken urgently to the cath lab. The patient understands that risks included but are not limited to stroke (1 in 1000), death (1 in 1000), kidney failure [usually temporary] (1 in 500), bleeding (1 in 200), allergic reaction [possibly serious] (1 in 200).  The patient understands and agrees to proceed.   TOBACCO ABUSE:  He was educated. She says she stopped smoking 2 days ago.  CARDIOMYOPATHY:  She has a previous history of this as above. Her ejection fraction we'll reassess.   SignedRollene Rotunda 05/19/2013, 6:24 PM

## 2013-05-19 NOTE — Consult Note (Signed)
ANTICOAGULATION CONSULT NOTE - Initial Consult  Pharmacy Consult for Heparin Indication: chest pain/ACS  No Known Allergies  Patient Measurements: Height: 5\' 7"  (170.2 cm) Weight: 118 lb (53.524 kg) IBW/kg (Calculated) : 61.6 Heparin Dosing Weight: 53.5kg  Vital Signs: Temp: 98 F (36.7 C) (09/22 1655) Temp src: Oral (09/22 1655) BP: 190/90 mmHg (09/22 1754) Pulse Rate: 52 (09/22 1754)  Labs:  Recent Labs  05/19/13 1736  HGB 15.4*  HCT 44.2  PLT 352    Estimated Creatinine Clearance: 46.5 ml/min (by C-G formula based on Cr of 1.1).   Medical History: Past Medical History  Diagnosis Date  . CAD (coronary artery disease)     NSTEMI 12/10: LHC - oD2 40%, LAD irreg., EF 50% with apical AK (tako-tsubo CM)  . 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 PAD (peripheral artery disease)     eval by Dr. [name] in past but patient canceled angiogram  . RA (rheumatoid arthritis)   . GERD (gastroesophageal reflux disease)   . LBP (low back pain)   . Dizziness     ? CVA 01/2010 - carotid dopplers with no ICA stenosis  . Arnold-Chiari malformation, type I     MRI 02/2010  . Bradycardia   . Tobacco abuse   . DEPRESSION   . HYPERLIPIDEMIA   . HYPERTENSION     Medications:  No anticoagulants pta  Assessment: 57yof with history of CAD presents to the ED with CP. Initial troponin is positive at 2.36. She will begin IV heparin. Baseline labs are pending.  Goal of Therapy:  Heparin level 0.3-0.7 units/ml Monitor platelets by anticoagulation protocol: Yes   Plan:  1) Heparin bolus 3500 units x 1 2) Heparin drip at 650 units/hr 3) 6 hour heparin level 4) Daily heparin level and CBC  Fredrik Rigger 05/19/2013,6:04 PM

## 2013-05-19 NOTE — Interval H&P Note (Signed)
History and Physical Interval Note:  05/19/2013 7:14 PM  Mercedes [name]  has presented today for surgery, with the diagnosis of Stemi  The various methods of treatment have been discussed with the patient and family. After consideration of risks, benefits and other options for treatment, the patient has consented to  Procedure(s): LEFT HEART CATHETERIZATION WITH CORONARY ANGIOGRAM (N/A) as a surgical intervention .  The patient's history has been reviewed, patient examined, no change in status, stable for surgery.  I have reviewed the patient's chart and labs.  Questions were answered to the patient's satisfaction.    Cath Lab Visit (complete for each Cath Lab visit)  Clinical Evaluation Leading to the Procedure:   ACS: yes  Non-ACS:    Anginal Classification: CCS IV  Anti-ischemic medical therapy: No Therapy  Non-Invasive Test Results: No non-invasive testing performed  Prior CABG: No previous CABG         Tonny Bollman

## 2013-05-20 DIAGNOSIS — F17200 Nicotine dependence, unspecified, uncomplicated: Secondary | ICD-10-CM

## 2013-05-20 DIAGNOSIS — I10 Essential (primary) hypertension: Secondary | ICD-10-CM

## 2013-05-20 DIAGNOSIS — I219 Acute myocardial infarction, unspecified: Secondary | ICD-10-CM

## 2013-05-20 LAB — CBC
HCT: 41.1 % (ref 36.0–46.0)
MCHC: 34.3 g/dL (ref 30.0–36.0)
MCV: 77.8 fL — ABNORMAL LOW (ref 78.0–100.0)
RBC: 5.28 MIL/uL — ABNORMAL HIGH (ref 3.87–5.11)
RDW: 13.7 % (ref 11.5–15.5)
WBC: 9.4 10*3/uL (ref 4.0–10.5)

## 2013-05-20 LAB — LIPID PANEL
Cholesterol: 175 mg/dL (ref 0–200)
LDL Cholesterol: 78 mg/dL (ref 0–99)
Total CHOL/HDL Ratio: 3 RATIO
VLDL: 39 mg/dL (ref 0–40)

## 2013-05-20 LAB — BASIC METABOLIC PANEL
CO2: 18 mEq/L — ABNORMAL LOW (ref 19–32)
Calcium: 8.6 mg/dL (ref 8.4–10.5)
Chloride: 105 mEq/L (ref 96–112)
Creatinine, Ser: 0.69 mg/dL (ref 0.50–1.10)
GFR calc non Af Amer: >90 mL/min (ref >90)
Glucose, Bld: 118 mg/dL — ABNORMAL HIGH (ref 70–99)

## 2013-05-20 LAB — TROPONIN I: Troponin I: 9.65 ng/mL (ref <0.30)

## 2013-05-20 MED ORDER — HYDRALAZINE HCL 20 MG/ML IJ SOLN
10.0000 mg | Freq: Once | INTRAMUSCULAR | Status: AC
  Administered 2013-05-20: 10 mg via INTRAVENOUS

## 2013-05-20 MED ORDER — MAGNESIUM HYDROXIDE 400 MG/5ML PO SUSP
30.0000 mL | Freq: Four times a day (QID) | ORAL | Status: DC | PRN
  Administered 2013-05-20: 30 mL via ORAL
  Filled 2013-05-20: qty 30

## 2013-05-20 MED ORDER — HYDRALAZINE HCL 20 MG/ML IJ SOLN
INTRAMUSCULAR | Status: AC
  Filled 2013-05-20: qty 1

## 2013-05-20 MED ORDER — ENSURE COMPLETE PO LIQD
237.0000 mL | Freq: Every day | ORAL | Status: DC
  Administered 2013-05-20: 237 mL via ORAL

## 2013-05-20 MED ORDER — NICOTINE 21 MG/24HR TD PT24
21.0000 mg | MEDICATED_PATCH | Freq: Every day | TRANSDERMAL | Status: DC
  Administered 2013-05-20 – 2013-05-21 (×2): 21 mg via TRANSDERMAL
  Filled 2013-05-20 (×2): qty 1

## 2013-05-20 MED FILL — Sodium Chloride IV Soln 0.9%: INTRAVENOUS | Qty: 50 | Status: AC

## 2013-05-20 NOTE — Progress Notes (Signed)
3 cc let out of TR band, pt began to rebleed. 3cc added back to TR band. Will continue to monitor.

## 2013-05-20 NOTE — Progress Notes (Signed)
    SUBJECTIVE: No chest pain or SOB. Events of last night noted. Pt was hypertensive and treated aggressively and BP dropped. NTG drip stopped and BP stable for last 7 hours.   BP 127/69  Pulse 66  Temp(Src) 98.9 F (37.2 C) (Oral)  Resp 14  Ht 5\' 6"  (1.676 m)  Wt 119 lb 4.3 oz (54.1 kg)  BMI 19.26 kg/m2  SpO2 99%  Intake/Output Summary (Last 24 hours) at 05/20/13 0737 Last data filed at 05/19/13 2200  Gross per 24 hour  Intake   63.6 ml  Output      0 ml  Net   63.6 ml    PHYSICAL EXAM General: Well developed, well nourished, in no acute distress. Alert and oriented x 3.  Psych:  Good affect, responds appropriately Neck: No JVD. No masses noted.  Lungs: Clear bilaterally with no wheezes or rhonci noted.  Heart: RRR with no murmurs noted. Abdomen: Bowel sounds are present. Soft, non-tender.  Extremities: No lower extremity edema. Small hematoma right wrist.   LABS: Basic Metabolic Panel:  Recent Labs  86/57/84 1736 05/20/13 0310  NA 136 136  K 3.6 4.3  CL 100 105  CO2 24 18*  GLUCOSE 120* 118*  BUN 9 8  CREATININE 0.94 0.69  CALCIUM 9.4 8.6   CBC:  Recent Labs  05/19/13 1736 05/20/13 0310  WBC 6.9 9.4  HGB 15.4* 14.1  HCT 44.2 41.1  MCV 78.0 77.8*  PLT 352 293   Cardiac Enzymes:  Recent Labs  05/19/13 1736 05/19/13 2215 05/20/13 0310  TROPONINI 3.57* 11.61* 7.73*   Fasting Lipid Panel:  Recent Labs  05/20/13 0310  CHOL 175  HDL 58  LDLCALC 78  TRIG 196*  CHOLHDL 3.0    Current Meds: . aspirin  81 mg Oral Daily  . atorvastatin  40 mg Oral q1800  . predniSONE  20 mg Oral Q breakfast   Followed by  . [START ON 05/21/2013] predniSONE  10 mg Oral Q breakfast  . sodium chloride  3 mL Intravenous Q12H  . Ticagrelor  90 mg Oral BID     ASSESSMENT AND PLAN:  1. Acute inferolateral STEMI/CAD: Pt admitted with STEMI on 05/19/13. Culprit vessel Circumflex. Now s/p DES x 1 proximal Circumflex. Moderate non-obstructive disease in LAD and  RCA. She will need dual anti-platelet therapy with ASA and Brilinta for one year. Continue high dose statin. Beta blocker has been held with bradycardia overnight. Echo this am. Out to telemetry unit. Anticipate d/c home on 05/21/13.    2. Tobacco abuse: Counseling this am on cessation.   3. HTN: BP controlled.   Mercedes [name]  9/23/20147:37 AM

## 2013-05-20 NOTE — Progress Notes (Signed)
INITIAL NUTRITION ASSESSMENT  DOCUMENTATION CODES Per approved criteria  -Not Applicable   INTERVENTION:  Ensure Complete daily (350 kcals, 13 gm protein per 8 fl oz bottle) RD to follow for nutrition care plan  NUTRITION DIAGNOSIS: Inadequate oral intake related to decreased appetite as evidenced by patient report  Goal: Pt to meet >/= 90% of their estimated nutrition needs   Monitor:  PO & supplemental intake, weight, labs, I/O's  Reason for Assessment: Malnutrition Screening Tool Report  57 y.o. female  Admitting Dx: chest pain  ASSESSMENT: Patient with PMH of CAD, GERD, PAD and HTN presented  with chest pain; did have some mild changes on EKG with inferior ST elevation; s/p cardiac cath procedure 9/22.  Patient reports her appetite isn't very good; PO intake 45% per flowsheet records; she states she usually eats 3 meals per day, however, sometimes she does "graze;" per weight readings, her weight has been stable; would benefit from addition of supplement; amenable to Ensure Complete daily ---> RD to order.  Height: Ht Readings from Last 1 Encounters:  05/19/13 5\' 6"  (1.676 m)    Weight: Wt Readings from Last 1 Encounters:  05/20/13 119 lb 4.3 oz (54.1 kg)    Ideal Body Weight: 130 lb  % Ideal Body Weight: 91%  Wt Readings from Last 10 Encounters:  05/20/13 119 lb 4.3 oz (54.1 kg)  05/20/13 119 lb 4.3 oz (54.1 kg)  04/30/13 121 lb (54.885 kg)  09/08/11 124 lb 1.9 oz (56.3 kg)  08/03/11 120 lb (54.432 kg)  06/08/10 128 lb (58.06 kg)  04/06/10 128 lb (58.06 kg)  03/18/10 120 lb (54.432 kg)  03/09/10 126 lb (57.153 kg)  02/21/10 126 lb (57.153 kg)    Usual Body Weight: 124 lb (January 2013)  % Usual Body Weight: 95%  BMI:  Body mass index is 19.26 kg/(m^2).  Estimated Nutritional Needs: Kcal: 1400-1600 Protein: 55-65 gm Fluid: <.= 1.5 L  Skin: Intact  Diet Order: Cardiac  EDUCATION NEEDS: -No education needs identified at this  time   Intake/Output Summary (Last 24 hours) at 05/20/13 1300 Last data filed at 05/20/13 0900  Gross per 24 hour  Intake  263.6 ml  Output      0 ml  Net  263.6 ml    Labs:   Recent Labs Lab 05/19/13 1736 05/20/13 0310  NA 136 136  K 3.6 4.3  CL 100 105  CO2 24 18*  BUN 9 8  CREATININE 0.94 0.69  CALCIUM 9.4 8.6  GLUCOSE 120* 118*    Scheduled Meds: . aspirin  81 mg Oral Daily  . atorvastatin  40 mg Oral q1800  . nicotine  21 mg Transdermal Daily  . [START ON 05/21/2013] predniSONE  10 mg Oral Q breakfast  . sodium chloride  3 mL Intravenous Q12H  . Ticagrelor  90 mg Oral BID    Continuous Infusions:   Past Medical History  Diagnosis Date  . CAD (coronary artery disease)     NSTEMI 12/10: LHC - D2 40%, LAD irreg., EF 50% with apical AK (tako-tsubo CM)  . 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 PAD (peripheral artery disease)     eval by Dr. [name] in past but patient canceled angiogram  . RA (rheumatoid arthritis)   . GERD (gastroesophageal reflux disease)   . LBP (low back pain)   . Dizziness     ? CVA 01/2010 -  carotid dopplers with no ICA stenosis  . Arnold-Chiari malformation, type I     MRI 02/2010  . Bradycardia   . Tobacco abuse   . DEPRESSION   . HYPERLIPIDEMIA   . HYPERTENSION     Past Surgical History  Procedure Laterality Date  . None      Maureen Chatters, RD, LDN Pager #: [phone] After-Hours Pager #: [phone]

## 2013-05-20 NOTE — Progress Notes (Signed)
Mercedes Joseph, MD repaged regarding pt's BP. BP has not decreased despite nitro drip. 10 mg hydralazine ordered. Per MD order will not let air our of TR band until SBP <160. Will continue to titrate nitro drip and monitor pt closely.

## 2013-05-20 NOTE — Progress Notes (Signed)
  Echocardiogram 2D Echocardiogram has been performed.  Georgian Co 05/20/2013, 10:16 AM

## 2013-05-20 NOTE — Progress Notes (Signed)
Adolm Joseph, MD made aware of pt's BP drop and discomfort. No new orders at this time. Will continue to monitor.

## 2013-05-20 NOTE — Progress Notes (Addendum)
,  CARDIAC REHAB PHASE I   PRE:  Rate/Rhythm: 63 SR  BP:  Supine: 117/66  Sitting:   Standing:    SaO2:   MODE:  Ambulation: 350 ft   POST:  Rate/Rhythm: 73 SR  BP:  Supine:   Sitting: 163/77  Standing:    SaO2:  1210-1245 Pt tolerated ambulation well without c/o of cp or SOB. BP after walk 163/77. Pt back to side of bed after walk with call light in reach. Started MI and stent education with pt. We discussed smoking cessation and I gave her tips for quitting and coaching contact number. Pt seems motivated to quit and she did quit  after first MI, but restated after one year. She agrees to [employer]. CRP in [place], will send referral. I will return tomorrow to finish education.  Melina Copa RN 05/20/2013 12:43 PM

## 2013-05-20 NOTE — Care Management Note (Signed)
    Page 1 of 1   05/20/2013     10:16:40 AM   CARE MANAGEMENT NOTE 05/20/2013  Patient:  INDIAH, HEYDEN   Account Number:  [id number]  Date Initiated:  05/20/2013  Documentation initiated by:  Junius Creamer  Subjective/Objective Assessment:   adm w mi     Action/Plan:   lives w fam   Anticipated DC Date:     Anticipated DC Plan:  HOME/SELF CARE      DC Planning Services  CM consult      Choice offered to / List presented to:             Status of service:   Medicare Important Message given?   (If response is "NO", the following Medicare IM given date fields will be blank) Date Medicare IM given:   Date Additional Medicare IM given:    Discharge Disposition:    Per UR Regulation:  Reviewed for med. necessity/level of care/duration of stay  If discussed at Long Length of Stay Meetings, dates discussed:    Comments:  9/23 1015a debbie Arrietty Dercole rn,bsn pt was given 30day free brilinta card. pt has 45.00 copay for brilinta per month.

## 2013-05-20 NOTE — Progress Notes (Addendum)
Pt called out to notify RN of sweating and nausea. Pt complained of back pain. 12 lead EKG performed. BP noted to be in the 50's. Nitro drip stopped. MD paged. Will continue to monitor closely.

## 2013-05-21 DIAGNOSIS — I2510 Atherosclerotic heart disease of native coronary artery without angina pectoris: Secondary | ICD-10-CM

## 2013-05-21 LAB — BASIC METABOLIC PANEL
CO2: 24 mEq/L (ref 19–32)
Chloride: 104 mEq/L (ref 96–112)
GFR calc Af Amer: >90 mL/min (ref >90)
Glucose, Bld: 103 mg/dL — ABNORMAL HIGH (ref 70–99)
Potassium: 3.8 mEq/L (ref 3.5–5.1)
Sodium: 138 mEq/L (ref 135–145)

## 2013-05-21 LAB — CBC
Hemoglobin: 13.5 g/dL (ref 12.0–15.0)
MCH: 26.5 pg (ref 26.0–34.0)
MCV: 77.5 fL — ABNORMAL LOW (ref 78.0–100.0)
RBC: 5.1 MIL/uL (ref 3.87–5.11)
WBC: 9.7 10*3/uL (ref 4.0–10.5)

## 2013-05-21 MED ORDER — ENSURE COMPLETE PO LIQD: 237.0000 mL | Freq: Every day | ORAL | Status: DC

## 2013-05-21 MED ORDER — LISINOPRIL 5 MG PO TABS
5.0000 mg | ORAL_TABLET | Freq: Every day | ORAL | Status: DC
  Administered 2013-05-21: 5 mg via ORAL
  Filled 2013-05-21: qty 1

## 2013-05-21 MED ORDER — TICAGRELOR 90 MG PO TABS: 90.0000 mg | ORAL_TABLET | Freq: Two times a day (BID) | ORAL | Status: DC

## 2013-05-21 MED ORDER — ASPIRIN 81 MG PO TABS: 81.0000 mg | ORAL_TABLET | Freq: Every day | ORAL | Status: DC

## 2013-05-21 MED ORDER — ATORVASTATIN CALCIUM 40 MG PO TABS: 40.0000 mg | ORAL_TABLET | Freq: Every day | ORAL | Status: DC

## 2013-05-21 MED ORDER — NITROGLYCERIN 0.4 MG SL SUBL: 0.4000 mg | SUBLINGUAL_TABLET | SUBLINGUAL | Status: DC | PRN

## 2013-05-21 MED ORDER — LISINOPRIL 5 MG PO TABS: 5.0000 mg | ORAL_TABLET | Freq: Every day | ORAL | Status: DC

## 2013-05-21 NOTE — Progress Notes (Signed)
05/21/2013 11:06 AM   Discharge instructions given  IV's removed and taken to car via W/C with belongings  Elizbeth Posa, Linnell Fulling

## 2013-05-21 NOTE — Progress Notes (Signed)
Chaplain was referred to patient for counsel.  Upon entering, pt was in bed with family in room.  Family was asleep.  Chaplain asked pt a few questions and engaged in quick conversation.  After a short period of time, chaplain ended visit but made pt aware of the services spiritual wholeness provides.  Follow up if needed or requested.    05/20/13 1400  Clinical Encounter Type  Visited With Patient and family together  Visit Type Initial;Spiritual support  Referral From Nurse  Consult/Referral To Chaplain  Spiritual Encounters  Spiritual Needs Prayer;Emotional  Stress Factors  Patient Stress Factors None identified  Family Stress Factors None identified  [facility] Sheral Apley  [phone]

## 2013-05-21 NOTE — Discharge Summary (Signed)
CARDIOLOGY DISCHARGE SUMMARY   Patient ID: Mercedes [name] MRN: [id number] DOB/AGE: 59/02/1956 59 y.o.  Admit date: 05/19/2013 Discharge date: 05/21/2013  Primary Discharge Diagnosis:     Acute MI, inferior wall - 3.5 x 16 mm Promus premier DES to the RCA  Secondary Discharge Diagnosis:    Tobacco use   Dyslipidemia, elevated triglycerides  Procedures: Left Heart Cath, Selective Coronary Angiography, LV angiography, PTCA and stenting of the Left Circumflex  Hospital Course: Mercedes [name] is a 57 y.o. female with a history of nonobstructive CAD and a previous MI felt secondary to Takotsubo cardiomyopathy. She had severe chest pain he came to the hospital. Her ECG had inferior ST elevation. She was treated medically and taken emergently to the cath lab.  Cardiac catheterization results are below. She received a drug-eluting stent to the RCA and tolerated the procedure well. There was no other critical disease and her EF was 55%.    No beta blocker was used initially because of bradycardia. She was started on aspirin, Brilinta and high-dose statin.   She had recently quit smoking and this was reinforced and encouraged.   She was seen by cardiac rehabilitation and educated on post-MI activity restrictions, heart-healthy lifestyle changes and exercise guidelines.   She was seen by a dietitian and Ensure daily was recommended as sometimes her appetite is poor.  On 05/21/2013, she was evaluated by Dr. [name] and seen by cardiac rehabilitation. Lisinopril was added to her medication regimen. She continued to have bradycardia and will not be on a beta blocker. She was ambulating without chest pain or shortness of breath and considered stable for discharge, to follow up as an outpatient.  Labs:  Lab Results  Component Value Date   WBC 9.7 05/21/2013   HGB 13.5 05/21/2013   HCT 39.5 05/21/2013   MCV 77.5* 05/21/2013   PLT 297 05/21/2013     Recent Labs Lab 05/21/13 0605  NA 138    K 3.8  CL 104  CO2 24  BUN 10  CREATININE 0.80  CALCIUM 8.8  GLUCOSE 103*    Recent Labs  05/19/13 2215 05/20/13 0310 05/20/13 1122  TROPONINI 11.61* 7.73* 9.65*   Lipid Panel     Component Value Date/Time   CHOL 175 05/20/2013 0310   TRIG 196* 05/20/2013 0310   HDL 58 05/20/2013 0310   CHOLHDL 3.0 05/20/2013 0310   VLDL 39 05/20/2013 0310   LDLCALC 78 05/20/2013 0310   Pro B Natriuretic peptide (BNP)  Date/Time Value Range Status  05/19/2013  5:36 PM 1120.0* 0 - 125 pg/mL Final     Radiology: Dg Chest 2 View 05/19/2013   CLINICAL DATA:  Mid chest pain, left shoulder pain, bilateral jaw pain, history coronary artery disease, cardiomyopathy, rheumatoid arthritis, smoker, hypertension, hyperlipidemia  EXAM: CHEST  2 VIEW  COMPARISON:  02/18/2010  FINDINGS: Normal heart size, mediastinal contours, and pulmonary vascularity.  Emphysematous changes consistent with COPD.  No acute infiltrate, pleural effusion or pneumothorax.  Bones unremarkable.  IMPRESSION: COPD changes.  No acute abnormalities.   Electronically Signed   By: Ulyses Southward M.D.   On: 05/19/2013 17:27   Cardiac Cath: 05/19/2013 Left mainstem: Widely patent without obstructive disease. Arises from the left cusp.  Left anterior descending (LAD): The LAD is diffusely diseased. The mid LAD has diffuse 50% stenosis throughout the entirety of the vessel. The first diagonal has 40-50% stenosis in its proximal portion.  Left circumflex (LCx): The left circumflex demonstrates a 75%  hypodense plaque just beyond the ostium. The AV circumflex is then a large caliber vessel. There is a single obtuse marginal that divides into multiple subbranches. 2 of the subbranches demonstrate abrupt cessation of flow suggestive of an embolic event. The AV circumflex beyond the obtuse marginal origin is patent.  Right coronary artery (RCA): The RCA is dominant. The vessel is diffusely diseased. The proximal vessel demonstrates 50-60% stenosis. The mid  RCA also demonstrates 50-60% stenosis at the area of the first marking marginal branch. The PDA and PLA branches are patent. There is a faint collateral visualized to the distal left circumflex distribution.  Left ventriculography: There is focal akinesis of the distal inferior wall near the left ventricular apex, typical of the left circumflex distribution infarct. The anterolateral, true apical, and basal and midinferior walls contract normally. The left ventricular ejection fraction is estimated at 55%. There is no significant mitral regurgitation. PCI Data:  Vessel - left circumflex/Segment - proximal  Percent Stenosis (pre) 75  TIMI-flow 3  Stent 3.5 x 16 mm Promus premier DES  Percent Stenosis (post) 0  TIMI-flow (post) 3  Final Conclusions:  1. Severe proximal left circumflex stenosis with distal occlusion of the obtuse marginal subbranch probably related to an embolic event from the proximal plaque  2. Moderate diffuse LAD stenosis  3. Moderate segmental RCA stenosis  4. Focal segmental left ventricular contraction abnormality consistent with a left circumflex distribution infarction with overall preserved left ventricular systolic function  Recommendations:  Recommend aspirin and brilinta for at least one year. No beta blocker acutely because of bradycardia with heart rate about 50 beats per minute.   EKG: Sinus rhythm, inferior ST elevation  Echo: 05/20/2013 Study Conclusions - Left ventricle: The cavity size was normal. Wall thickness was normal. Systolic function was normal. The estimated ejection fraction was in the range of 55% to 60%. There is hypokinesis of the mid-distallateral myocardium. Doppler parameters are consistent with abnormal left ventricular relaxation (grade 1 diastolic dysfunction). - Mitral valve: Trivial regurgitation. - Right atrium: Central venous pressure: 3mm Hg (est). - Tricuspid valve: Trivial regurgitation. - Pulmonary arteries: Systolic pressure  could not be accurately estimated. - Pericardium, extracardiac: There was no pericardial effusion. Probable epicardial adipose tissue near apex. Impressions: - Comparison to prior study from February 2011. Upper normal LV wall thickness with LVEF 55-60%, mid to distal lateral hypokinesis, grade 1 diastolic dysfunction. Normal RV contraction. Unable to assess PASP. No pericardial effusion.   FOLLOW UP PLANS AND APPOINTMENTS No Known Allergies   Medication List         aspirin 81 MG tablet  Take 1 tablet (81 mg total) by mouth daily.     atorvastatin 40 MG tablet  Commonly known as:  LIPITOR  Take 1 tablet (40 mg total) by mouth daily.     Clobetasol Propionate 0.05 % lotion  Apply 3 Squirts topically 2 (two) times daily.     feeding supplement Liqd  Take 237 mLs by mouth daily at 3 pm.     lisinopril 5 MG tablet  Commonly known as:  PRINIVIL,ZESTRIL  Take 1 tablet (5 mg total) by mouth daily.     nitroGLYCERIN 0.4 MG SL tablet  Commonly known as:  NITROSTAT  Place 1 tablet (0.4 mg total) under the tongue every 5 (five) minutes as needed for chest pain.     predniSONE 10 MG tablet  Commonly known as:  DELTASONE  Take 20 mg by mouth daily. Pt's on a tapering dose.  Pt started on 05-13-13 for a total therapy of 12 days. Pt's completed  30 mg for 4 days. Pt's on 20 mg for 2 more days and then will taper to 10 mg  Daily for the last 4 days of therapy.     Ticagrelor 90 MG Tabs tablet  Commonly known as:  BRILINTA  Take 1 tablet (90 mg total) by mouth 2 (two) times daily.        Discharge Orders   Future Appointments Provider Department Dept Phone   05/27/2013 10:00 AM [facility] Covering Provider [facility] Joan Flores [phone]   Future Orders Complete By Expires   Amb Referral to Cardiac Rehabilitation  As directed    Diet - low sodium heart healthy  As directed    Increase activity slowly  As directed      Follow-up Information   Follow up with  Olga Millers, MD. ( The office will call.)    Specialty:  Cardiology   Contact information:   [address] [place] [address] [phone]       Follow up with [facility] COVERING PROVIDER On 05/27/2013. [phone] [facility] at 10:00 AM)       BRING ALL MEDICATIONS WITH YOU TO FOLLOW UP APPOINTMENTS  Time spent with patient to include physician time: 39 min Signed: Theodore Demark, PA-C 05/21/2013, 10:19 AM Co-Sign MD

## 2013-05-21 NOTE — Discharge Summary (Signed)
See full note this am. cdm 

## 2013-05-21 NOTE — Progress Notes (Signed)
    SUBJECTIVE: NO chest pain or SOB. Short run NSVT yesterday am.   BP 110/55  Pulse 57  Temp(Src) 98.4 F (36.9 C) (Oral)  Resp 16  Ht 5\' 6"  (1.676 m)  Wt 121 lb 11.1 oz (55.2 kg)  BMI 19.65 kg/m2  SpO2 94%  Intake/Output Summary (Last 24 hours) at 05/21/13 0705 Last data filed at 05/20/13 1800  Gross per 24 hour  Intake    680 ml  Output      0 ml  Net    680 ml    PHYSICAL EXAM General: Well developed, well nourished, in no acute distress. Alert and oriented x 3.  Psych:  Good affect, responds appropriately Neck: No JVD. No masses noted.  Lungs: Clear bilaterally with no wheezes or rhonci noted.  Heart: RRR with no murmurs noted. Abdomen: Bowel sounds are present. Soft, non-tender.  Extremities: No lower extremity edema.   LABS: Basic Metabolic Panel:  Recent Labs  16/10/96 0310 05/21/13 0605  NA 136 138  K 4.3 3.8  CL 105 104  CO2 18* 24  GLUCOSE 118* 103*  BUN 8 10  CREATININE 0.69 0.80  CALCIUM 8.6 8.8   CBC:  Recent Labs  05/20/13 0310 05/21/13 0605  WBC 9.4 9.7  HGB 14.1 13.5  HCT 41.1 39.5  MCV 77.8* 77.5*  PLT 293 297   Cardiac Enzymes:  Recent Labs  05/19/13 2215 05/20/13 0310 05/20/13 1122  TROPONINI 11.61* 7.73* 9.65*   Fasting Lipid Panel:  Recent Labs  05/20/13 0310  CHOL 175  HDL 58  LDLCALC 78  TRIG 196*  CHOLHDL 3.0    Current Meds: . aspirin  81 mg Oral Daily  . atorvastatin  40 mg Oral q1800  . feeding supplement  237 mL Oral Q1500  . nicotine  21 mg Transdermal Daily  . predniSONE  10 mg Oral Q breakfast  . sodium chloride  3 mL Intravenous Q12H  . Ticagrelor  90 mg Oral BID   Echo 05/21/13: Left ventricle: The cavity size was normal. Wall thickness was normal. Systolic function was normal. The estimated ejection fraction was in the range of 55% to 60%. There is hypokinesis of the mid-distallateral myocardium. Doppler parameters are consistent with abnormal left ventricular relaxation (grade 1  diastolic dysfunction). - Mitral valve: Trivial regurgitation. - Right atrium: Central venous pressure: 3mm Hg (est). - Tricuspid valve: Trivial regurgitation. - Pulmonary arteries: Systolic pressure could not be accurately estimated. - Pericardium, extracardiac: There was no pericardial effusion. Probable epicardial adipose tissue near apex. Impressions:  - Comparison to prior study from February 2011. Upper normal LV wall thickness with LVEF 55-60%, mid to distal lateral hypokinesis, grade 1 diastolic dysfunction. Normal RV contraction. Unable to assess PASP. No pericardial effusion.  ASSESSMENT AND PLAN:  1. Acute inferolateral STEMI/CAD: Pt admitted with STEMI on 05/19/13. Culprit vessel Circumflex. Now s/p DES x 1 proximal Circumflex. Moderate non-obstructive disease in LAD and RCA. She will need dual anti-platelet therapy with ASA and Brilinta for one year. Continue high dose statin. Beta blocker has been held secondary to bradycardia. Echo with preserved LV systolic function with focal wall motion abnormality. Will add Lisinopril 5 mg po Qdaily.   2. Tobacco abuse: Counseling this am on cessation.   3. HTN: BP controlled.   4. Dispo: D/C home today. Follow up Dr. Jens Som in 2-3 weeks.     Ben Habermann  9/24/20147:05 AM

## 2013-05-21 NOTE — Progress Notes (Signed)
CARDIAC REHAB PHASE I   Pt moving independently without problems. Ed completed. Encouraged in smoking cessation, increasing ex, working on diet. Understands NTG although seems somewhat overwhelmed by the fact that she needs to carry it. Ready for d/c. [id number]  Harriet Masson CES, ACSM 05/21/2013 9:22 AM

## 2013-05-25 DIAGNOSIS — I2510 Atherosclerotic heart disease of native coronary artery without angina pectoris: Secondary | ICD-10-CM | POA: Diagnosis present

## 2013-05-27 ENCOUNTER — Encounter: Payer: Self-pay | Admitting: Internal Medicine

## 2013-05-27 ENCOUNTER — Ambulatory Visit: Payer: Medicare Other | Attending: Internal Medicine | Admitting: Internal Medicine

## 2013-05-27 VITALS — BP 180/85 | HR 55 | Temp 97.7°F | Resp 16 | Ht 66.14 in | Wt 117.0 lb

## 2013-05-27 DIAGNOSIS — I10 Essential (primary) hypertension: Secondary | ICD-10-CM

## 2013-05-27 DIAGNOSIS — I219 Acute myocardial infarction, unspecified: Secondary | ICD-10-CM | POA: Insufficient documentation

## 2013-05-27 LAB — BASIC METABOLIC PANEL
BUN: 16 mg/dL (ref 6–23)
CO2: 26 mEq/L (ref 19–32)
Calcium: 10.3 mg/dL (ref 8.4–10.5)
Chloride: 98 mEq/L (ref 96–112)
Glucose, Bld: 97 mg/dL (ref 70–99)
Potassium: 4.2 mEq/L (ref 3.5–5.3)
Sodium: 135 mEq/L (ref 135–145)

## 2013-05-27 MED ORDER — LISINOPRIL 20 MG PO TABS: 20.0000 mg | ORAL_TABLET | Freq: Every day | ORAL | Status: DC

## 2013-05-27 NOTE — Progress Notes (Signed)
Patient ID: Mercedes [name], female   DOB: 10-15-1955, 57 y.o.   MRN: [id number]   CC: Followup  HPI: Patient is 57 year old female with complicated and complex medical history including nonobstructive CAD and previous MI secondary to Takotsubo cardiomyopathy, recent hospitalization for acute MI and required emergent catheterization. She has received drug-eluting stent to right coronary artery and has tolerated procedure well. Ejection fraction noted to be 55%. Please note that beta blocker was not used initially due to bradycardia and patient was started on aspirin, and high-dose statin. She does not have a primary care physician and she's here today to establish primary care provider. She reports following recommendations on exercise and diet, she reports compliance with her medications and explains she has been checking blood pressure regularly and noted that blood pressure is persistently higher than 150/90. She currently denies chest pain or shortness of breath, denies abdominal or urinary concerns, no specific focal neurological symptoms.      No Known Allergies Past Medical History  Diagnosis Date  . CAD (coronary artery disease)     NSTEMI 12/10: LHC - D2 40%, LAD irreg., EF 50% with apical AK (tako-tsubo CM)  . 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 PAD (peripheral artery disease)     eval by Dr. [name] in past but patient canceled angiogram  . RA (rheumatoid arthritis)   . GERD (gastroesophageal reflux disease)   . LBP (low back pain)   . Dizziness     ? CVA 01/2010 - carotid dopplers with no ICA stenosis  . Arnold-Chiari malformation, type I     MRI 02/2010  . Bradycardia   . Tobacco abuse   . DEPRESSION   . HYPERLIPIDEMIA   . HYPERTENSION    Current Outpatient Prescriptions on File Prior to Visit  Medication Sig Dispense Refill  . aspirin 81 MG tablet Take 1 tablet (81 mg total) by mouth daily.  30  tablet    . atorvastatin (LIPITOR) 40 MG tablet Take 1 tablet (40 mg total) by mouth daily.  30 tablet  11  . Clobetasol Propionate 0.05 % lotion Apply 3 Squirts topically 2 (two) times daily.  59 mL  0  . feeding supplement (ENSURE COMPLETE) LIQD Take 237 mLs by mouth daily at 3 pm.      . nitroGLYCERIN (NITROSTAT) 0.4 MG SL tablet Place 1 tablet (0.4 mg total) under the tongue every 5 (five) minutes as needed for chest pain.  25 tablet  3  . predniSONE (DELTASONE) 10 MG tablet Take 20 mg by mouth daily. Pt's on a tapering dose. Pt started on 05-13-13 for a total therapy of 12 days. Pt's completed  30 mg for 4 days. Pt's on 20 mg for 2 more days and then will taper to 10 mg  Daily for the last 4 days of therapy.      . Ticagrelor (BRILINTA) 90 MG TABS tablet Take 1 tablet (90 mg total) by mouth 2 (two) times daily.  60 tablet  11   No current facility-administered medications on file prior to visit.   Family History  Problem Relation Age of Onset  . Arthritis Mother   . Arthritis Father   . Diabetes Other    History   Social History  . Marital Status: Single    Spouse Name: N/A    Number of Children: 3  . Years of Education: N/A   Occupational History  .  Not on file.   Social History Main Topics  . Smoking status: Current Every Day Smoker    Types: Cigarettes  . Smokeless tobacco: Not on file     Comment: Has not smoked in a couple of days.   . Alcohol Use: Yes  . Drug Use: No  . Sexual Activity: Not on file   Other Topics Concern  . Not on file   Social History Narrative   Lives alone.    Review of Systems  Constitutional: Negative for fever, chills, diaphoresis, activity change, appetite change and fatigue.  HENT: Negative for ear pain, nosebleeds, congestion, facial swelling, rhinorrhea, neck pain, neck stiffness and ear discharge.   Eyes: Negative for pain, discharge, redness, itching and visual disturbance.  Respiratory: Negative for cough, choking, chest  tightness, shortness of breath, wheezing and stridor.   Cardiovascular: Negative for chest pain, palpitations and leg swelling.  Gastrointestinal: Negative for abdominal distention.  Genitourinary: Negative for dysuria, urgency, frequency, hematuria, flank pain, decreased urine volume, difficulty urinating and dyspareunia.  Musculoskeletal: Negative for back pain, joint swelling, arthralgias and gait problem.  Neurological: Negative for dizziness, tremors, seizures, syncope, facial asymmetry, speech difficulty, weakness, light-headedness, numbness and headaches.  Hematological: Negative for adenopathy. Does not bruise/bleed easily.  Psychiatric/Behavioral: Negative for hallucinations, behavioral problems, confusion, dysphoric mood, decreased concentration and agitation.    Objective:   Filed Vitals:   05/27/13 1018  BP: 180/85  Pulse: 55  Temp: 97.7 F (36.5 C)  Resp: 16    Physical Exam  Constitutional: Appears well-developed and well-nourished. No distress.  HENT: Normocephalic. External right and left ear normal. Oropharynx is clear and moist.  Eyes: Conjunctivae and EOM are normal. PERRLA, no scleral icterus.  Neck: Normal ROM. Neck supple. No JVD. No tracheal deviation. No thyromegaly.  CVS: Regular rhythm, bradycardic, S1/S2 +, no murmurs, no gallops, no carotid bruit.  Pulmonary: Effort and breath sounds normal, no stridor, rhonchi, wheezes, rales.  Abdominal: Soft. BS +,  no distension, tenderness, rebound or guarding.  Musculoskeletal: Normal range of motion. No edema and no tenderness.  Lymphadenopathy: No lymphadenopathy noted, cervical, inguinal. Neuro: Alert. Normal reflexes, muscle tone coordination. No cranial nerve deficit. Skin: Skin is warm and dry. No rash noted. Not diaphoretic. No erythema. No pallor.  Psychiatric: Normal mood and affect. Behavior, judgment, thought content normal.   Lab Results  Component Value Date   WBC 9.7 05/21/2013   HGB 13.5 05/21/2013    HCT 39.5 05/21/2013   MCV 77.5* 05/21/2013   PLT 297 05/21/2013   Lab Results  Component Value Date   CREATININE 0.80 05/21/2013   BUN 10 05/21/2013   NA 138 05/21/2013   K 3.8 05/21/2013   CL 104 05/21/2013   CO2 24 05/21/2013    No results found for this basename: HGBA1C   Lipid Panel     Component Value Date/Time   CHOL 175 05/20/2013 0310   TRIG 196* 05/20/2013 0310   HDL 58 05/20/2013 0310   CHOLHDL 3.0 05/20/2013 0310   VLDL 39 05/20/2013 0310   LDLCALC 78 05/20/2013 0310       Assessment and plan:   Patient Active Problem List   Diagnosis Date Noted  . Acute MI, inferior wall - appears to be well and continues recovery. Encouraged compliance with recommended exercise, diet, medications. Encourage tobacco cessation as she still smoking.  05/19/2013  . BRADYCARDIA - patient is asymptomatic with heart rate in the mid 50s, no bleed oh blockers prescribed  03/09/2010  .  DEPRESSION - appears to be stable and at baseline  11/02/2009  . HYPERTENSION - patient is on lisinopril 5 mg by mouth daily. Blood pressure is greater than 160 over 90s and therefore will increase the dose of lisinopril to 20 mg by mouth daily. Patient advised to check her blood pressure regularly and to call us back if the numbers are persistently higher than 140/90. Patient advised to come back in one month for followup and blood pressure check  08/26/2009

## 2013-05-27 NOTE — Patient Instructions (Signed)

## 2013-05-27 NOTE — Progress Notes (Signed)
Pt is here to establish care. Pt reports of having a rash that wont go away. She seen a dermatologist has been prescribed different topicals with little success. Pt had a heart attack last Sunday. PT had a stint put in on Monday. Pt also reports that her blood pressure is been up and down.

## 2013-06-02 ENCOUNTER — Ambulatory Visit (INDEPENDENT_AMBULATORY_CARE_PROVIDER_SITE_OTHER): Payer: Medicare Other | Admitting: Physician Assistant

## 2013-06-02 ENCOUNTER — Encounter: Payer: Self-pay | Admitting: Physician Assistant

## 2013-06-02 VITALS — BP 136/84 | HR 62 | Ht 66.0 in | Wt 117.0 lb

## 2013-06-02 DIAGNOSIS — M3500 Sicca syndrome, unspecified: Secondary | ICD-10-CM

## 2013-06-02 DIAGNOSIS — E785 Hyperlipidemia, unspecified: Secondary | ICD-10-CM

## 2013-06-02 DIAGNOSIS — I2510 Atherosclerotic heart disease of native coronary artery without angina pectoris: Secondary | ICD-10-CM

## 2013-06-02 DIAGNOSIS — I10 Essential (primary) hypertension: Secondary | ICD-10-CM

## 2013-06-02 HISTORY — DX: Sjogren syndrome, unspecified: M35.00

## 2013-06-02 NOTE — Patient Instructions (Addendum)
PLEASE FOLLOW UP WITH DR. CRENSHAW 07/21/13 @ 9:15  FASTING LIPID AND LIVER PANEL TO BE DONE IN 07/21/13 SAME DAY YOU SEE DR. CRENSHAW  YOU HAVE BEEN REFERRED TO CARDIAC REHAB AT [place]  YOU ARE BEING REFERRED TO EST. WITH A PRIMARY CARE PHYSICIAN AT [facility] HTN

## 2013-06-02 NOTE — Progress Notes (Signed)
[address] [place], [place]  [address] Phone: [phone] Fax:  [phone]  Date:  06/02/2013   ID:  Mercedes [name], DOB 1956/05/08, MRN [id number]  PCP:  No primary provider on file.  Cardiologist:  Dr. Olga Millers     History of Present Illness: Mercedes [name] is a 58 y.o. female who returns for follow up after a recent admission to the hospital for an inferior STEMI.   She is a previous patient of mine at [facility]. She was admitted to [facility] with complaints of chest pain in Dec 2010. Her enzymes were positive. A cardiac catheterization was performed on August 16, 2009. There was a 40% LAD but no obstructive disease was noted. The ejection fraction was 50% and there was apical ballooning. This was felt consistent with Tako-Tsubo CM. Note the renal arteries were patent. Followup echocardiogram in February of 2011 revealed an ejection fraction of 50-55% with mild apical hypokinesis. There was mild mitral regurgitation. Carotid Dopplers in June of 2011 were negative. MRA in July of 2011 secondary to dizziness was also negative for carotid disease. She has been seen by Dr. [name] for abnormal ABIs and claudication. Peripheral arteriogram was scheduled but was canceled as her mother was ill and never rescheduled.  Last seen in this office 07/2011.  She was admitted 9/22-9/24 after presenting with severe chest pain and inferior ST elevation on her ECG.  LHC (05/19/13):  mLAD 50, pD1 40-50, pCFX 75 (culprit), OM with 2 subbranches with abrupt cessation of flow suggestive of embolic event, and pRCA 50-60, mRCA 50-60; faint collaterals from RCA to dCFX; EF 55%, inferior AK.  PCI: Promus premier (3.5x16 mm) DES to pCFX.  Echo (05/20/13): EF 55-60%, mid to distal lateral wall HK, grade 1 diastolic dysfunction, trivial MR, trivial TR. No beta blocker was started due to bradycardia. She was placed on high-dose statin, aspirin and Ventolin to as well as ACE  inhibitor.  The patient denies chest pain, shortness of breath, syncope, orthopnea, PND or significant pedal edema.   Labs (9/14):  K 4.2, creatinine 0.94, HDL 58, LDL 78, Hgb 13.5  Wt Readings from Last 3 Encounters:  06/02/13 117 lb (53.071 kg)  05/27/13 117 lb (53.071 kg)  05/21/13 121 lb 11.1 oz (55.2 kg)     Past Medical History  Diagnosis Date  . CAD (coronary artery disease)     NSTEMI 12/10: LHC - D2 40%, LAD irreg., EF 50% with apical AK (tako-tsubo CM)  . 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 PAD (peripheral artery disease)     eval by Dr. [name] in past but patient canceled angiogram  . RA (rheumatoid arthritis)   . GERD (gastroesophageal reflux disease)   . LBP (low back pain)   . Dizziness     ? CVA 01/2010 - carotid dopplers with no ICA stenosis  . Arnold-Chiari malformation, type I     MRI 02/2010  . Bradycardia   . Tobacco abuse   . DEPRESSION   . HYPERLIPIDEMIA   . HYPERTENSION     Current Outpatient Prescriptions  Medication Sig Dispense Refill  . aspirin 81 MG tablet Take 1 tablet (81 mg total) by mouth daily.  30 tablet    . atorvastatin (LIPITOR) 40 MG tablet Take 1 tablet (40 mg total) by mouth daily.  30 tablet  11  . feeding supplement (ENSURE COMPLETE) LIQD  Take 237 mLs by mouth daily at 3 pm.      . lisinopril (PRINIVIL,ZESTRIL) 20 MG tablet Take 1 tablet (20 mg total) by mouth daily.  90 tablet  3  . nitroGLYCERIN (NITROSTAT) 0.4 MG SL tablet Place 1 tablet (0.4 mg total) under the tongue every 5 (five) minutes as needed for chest pain.  25 tablet  3  . Ticagrelor (BRILINTA) 90 MG TABS tablet Take 1 tablet (90 mg total) by mouth 2 (two) times daily.  60 tablet  11   No current facility-administered medications for this visit.    Allergies:   No Known Allergies  Social History:  The patient  reports that she has quit smoking. Her smoking use included Cigarettes. She smoked  0.00 packs per day. She does not have any smokeless tobacco history on file. She reports that  drinks alcohol. She reports that she does not use illicit drugs.   Family History:  The patient's family history includes Arthritis in her father and mother; Diabetes in her other.   ROS:  Please see the history of present illness.      All other systems reviewed and negative.   PHYSICAL EXAM: VS:  BP 136/84  Pulse 62  Ht 5\' 6"  (1.676 m)  Wt 117 lb (53.071 kg)  BMI 18.89 kg/m2 Well nourished, well developed, in no acute distress HEENT: normal Neck: no JVD Cardiac:  normal S1, S2; RRR; no murmur Lungs:  clear to auscultation bilaterally, no wheezing, rhonchi or rales Abd: soft, nontender, no hepatomegaly Ext: no edema; right wrist without hematoma or mass  Skin: warm and dry Neuro:  CNs 2-12 intact, no focal abnormalities noted  EKG:  NSR, HR 62, LAD, inferolateral T wave inversions, no change from prior tracing     ASSESSMENT AND PLAN:  1. CAD:  Doing well after recent Inf STEMI tx with DES to the CFX.  Med Rx for residual CAD.  She is not having any angina.  Refer to cardiac rehab.  We discussed the importance of dual antiplatelet therapy.  I filled out forms for her today for assistance with Brilinta.   Hypertension:  Controlled.  Continue current therapy.  2. Hyperlipidemia:  Continue statin.  Check L/L in 4 weeks.  3. Tobacco Abuse:  She has quit smoking.   4. Disposition:  F/u with Dr. Olga Millers in 6 weeks.   Signed, [name], PA-C  06/02/2013 12:03 PM

## 2013-06-06 ENCOUNTER — Telehealth: Payer: Self-pay | Admitting: Cardiology

## 2013-06-06 NOTE — Telephone Encounter (Signed)
Walk in pt Form " [place]" papers Dropped Off gave to Victorio Palm 06/06/13/KM

## 2013-06-06 NOTE — Telephone Encounter (Signed)
Spoke with pt, aware paperwork is filled out and will be faxed once signed by dr Jens Som on Monday.

## 2013-06-10 ENCOUNTER — Telehealth: Payer: Self-pay | Admitting: Cardiology

## 2013-06-10 NOTE — Telephone Encounter (Signed)
Can discuss at next [facility]

## 2013-06-10 NOTE — Telephone Encounter (Signed)
New Problem:  Pt states her dermatologist just informed her that she has lupus... The pt would like to know how that will effect her and her cardiology care plan. Please advise

## 2013-06-10 NOTE — Telephone Encounter (Signed)
Spoke with pt, she wonders if any of her current medicine can be a cause of the lupus. And does she need to see [place] prior to seeing the dermatologist. Will discuss with dr Jens Som tomorrow and let the pt know.

## 2013-06-10 NOTE — Telephone Encounter (Signed)
Pt states has been diagnosed with Lupus. She would like to know how this diagnosis will effect her and her plan of care as a cardiac stand point. Pt is aware that I will send this message to the MD and his nurse for reviewing.

## 2013-06-11 NOTE — Telephone Encounter (Signed)
Spoke with pt, Aware of dr crenshaw's recommendations.  °

## 2013-06-11 NOTE — Telephone Encounter (Signed)
No Olga Millers

## 2013-06-13 ENCOUNTER — Encounter: Payer: Self-pay | Admitting: Internal Medicine

## 2013-06-13 ENCOUNTER — Ambulatory Visit (INDEPENDENT_AMBULATORY_CARE_PROVIDER_SITE_OTHER): Payer: Medicare Other | Admitting: Internal Medicine

## 2013-06-13 VITALS — BP 124/70 | HR 58 | Temp 98.6°F | Wt 116.4 lb

## 2013-06-13 DIAGNOSIS — M069 Rheumatoid arthritis, unspecified: Secondary | ICD-10-CM

## 2013-06-13 DIAGNOSIS — R634 Abnormal weight loss: Secondary | ICD-10-CM

## 2013-06-13 DIAGNOSIS — M329 Systemic lupus erythematosus, unspecified: Secondary | ICD-10-CM | POA: Insufficient documentation

## 2013-06-13 DIAGNOSIS — I2510 Atherosclerotic heart disease of native coronary artery without angina pectoris: Secondary | ICD-10-CM

## 2013-06-13 DIAGNOSIS — I10 Essential (primary) hypertension: Secondary | ICD-10-CM

## 2013-06-13 MED ORDER — PREDNISONE (PAK) 10 MG PO TABS: 10.0000 mg | ORAL_TABLET | ORAL | Status: DC

## 2013-06-13 NOTE — Patient Instructions (Signed)
It was good to see you today.  We have reviewed your prior records including labs and tests today  we will send to your dermatologist Dr Stefanie Libel for "release of records" about your lupus diagnosis as discussed today.  Medications reviewed and updated -resume prednisone taper as discussed over next 12 days until we establish a with rheumatologist or until followup with dermatology  we'll make referral to Rheumatology specialist. Our office will contact you regarding appointment(s) once made.  Okay to eat anything you want -no restrictions at this time!  Please schedule followup in 3-4 weeks for recheck, call sooner if problems.

## 2013-06-13 NOTE — Assessment & Plan Note (Addendum)
Per pt reports from derm/bx - Dr Stefanie Libel in [facility] ([facility] [place] [facility]) "skin only" per pt Will send for ROI Flare ongoing since off pred following hospitalization for inferior MI/DES to RCA late 04/2013 Resume pred now 12 days pending follow up with derm Also refer to rheum for local care (pt reports too difficult to transport to [place]) -  prior pt of Dr [name] for RA

## 2013-06-13 NOTE — Progress Notes (Signed)
Subjective:    Patient ID: Mercedes [name], female    DOB: [birth year], 57 y.o.   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" per dermatology eval in [place] (dr Stefanie Libel, no records available at time of OV) - has been on pred taper (interrupted by hosp for MI),  with recurrence of rash in past 72h since completing pred 10 days ago - planned follow up with derm in next 3 weeks, no rheumatologist visit in >5 years (since tx for RA by dr [name])  CAD - inf MI with DES to RCA 04/2013 - cath and recent DC summary reviewed - prior NQWMI 07/2009 with transient tako-tsubo CM, resolved - currently, no angina - reports med compliance with rx'd regimen (plavix, ASA, beta-blocker, ARB, statin)  Weight loss, unintentional - ongoing since ?2010 when reports weighed #130. Reviewed trend: #128 (05/2010), #124 (08/2011), #117 (04/2013). Denies BRBPR, hemopytsis or PU/PD  hypertension - the patient reports variable compliance with medication(s) as prescribed. Denies adverse side effects.    Past Medical History  Diagnosis Date  . 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 PAD (peripheral artery disease)     eval by Dr. [name] in past but patient canceled angiogram  . RA (rheumatoid arthritis)     prior tx by [name]  . GERD (gastroesophageal reflux disease)   . Dizziness     ? CVA 01/2010 - carotid dopplers with no ICA stenosis  . Arnold-Chiari malformation, type I     MRI brain 02/2010  . Bradycardia   . Tobacco abuse     quit 04/2013  . DEPRESSION   . HYPERLIPIDEMIA   . HYPERTENSION   . CAD (coronary artery disease)     NSTEMI 07/2009: LHC - D2 40%, LAD irreg., EF 50% with  apical AK (tako-tsubo CM);  Inf STEMI (04/2013): Tx Promus DES to CFX  . SLE (systemic lupus erythematosus) dx 05/2013   Family History  Problem Relation Age of Onset  . Arthritis Mother   . Arthritis Father   . Diabetes Other    History  Substance Use Topics  . Smoking status: Former Smoker    Types: Cigarettes    Quit date: 05/19/2013  . Smokeless tobacco: Former Neurosurgeon    Quit date: 05/19/2013     Comment:    . Alcohol Use: Yes   Review of Systems  Constitutional: Positive for fatigue and unexpected weight change. Negative for fever and appetite change.  HENT: Negative for mouth sores and sore throat.   Respiratory: Negative for cough, chest tightness, shortness of breath and wheezing.   Cardiovascular: Negative for chest pain, palpitations and leg swelling.  Gastrointestinal: Negative for nausea, abdominal pain and diarrhea.  Musculoskeletal: Positive for arthralgias. Negative for back pain and myalgias.  Skin: Positive for rash. Negative for color change and wound.  Neurological: Negative for dizziness, weakness, light-headedness and headaches.  Hematological: Does not bruise/bleed easily.  Psychiatric/Behavioral: Negative for sleep disturbance, dysphoric mood and decreased concentration. The patient is not nervous/anxious.   All other systems reviewed and are negative.        Objective:   Physical  Exam BP 124/70  Pulse 58  Temp(Src) 98.6 F (37 C) (Oral)  Wt 116 lb 6.4 oz (52.799 kg)  BMI 18.8 kg/m2  SpO2 99% Wt Readings from Last 3 Encounters:  06/13/13 116 lb 6.4 oz (52.799 kg)  06/02/13 117 lb (53.071 kg)  05/27/13 117 lb (53.071 kg)   Constitutional: She is thin, appears well-developed and well-nourished. No distress.  Eyes: Conjunctivae and EOM are normal. Pupils are equal, round, and reactive to light. No scleral icterus.  Neck: Normal range of motion. Neck supple. No JVD present. No thyromegaly present.  Cardiovascular: Normal rate, regular rhythm and  normal heart sounds.  No murmur heard. No BLE edema. Pulmonary/Chest: Effort normal and breath sounds normal. No respiratory distress. She has no wheezes.  Abdominal: Soft. Bowel sounds are normal. She exhibits no distension. There is no tenderness. no masses Musculoskeletal: Normal range of motion, no joint effusions. No gross deformities Neurological: She is alert and oriented to person, place, and time. No cranial nerve deficit. Coordination normal.  Skin: diffuse maculopapular rash, 3-53mm discoid lesions on trunk chest> back and proximal shoulders>forearms - palms involved with deep erythematous changes of finger tips. violeacous coloration over bridge of nose. Skin is warm and dry. No erythema.  Psychiatric: She has a mildly dysphoric mood and affect. Her behavior is normal. Judgment and thought content normal.   Lab Results  Component Value Date   WBC 9.7 05/21/2013   HGB 13.5 05/21/2013   HCT 39.5 05/21/2013   PLT 297 05/21/2013   GLUCOSE 97 05/27/2013   CHOL 175 05/20/2013   TRIG 196* 05/20/2013   HDL 58 05/20/2013   LDLCALC 78 05/20/2013   ALT 16 09/18/2011   AST 20 09/18/2011   NA 135 05/27/2013   K 4.2 05/27/2013   CL 98 05/27/2013   CREATININE 0.94 05/27/2013   BUN 16 05/27/2013   CO2 26 05/27/2013   TSH 0.47 08/03/2011   INR 0.99 08/16/2009        Assessment & Plan:   See problem list. Medications and labs reviewed today.  Time spent with pt today 42 minutes, greater than 50% time spent counseling patient on rash and presumed lupus, CAD events, weight loss trends and medication review. Also review of prior records and ROI from [facility] dermatologist

## 2013-06-14 ENCOUNTER — Encounter: Payer: Self-pay | Admitting: Internal Medicine

## 2013-06-14 NOTE — Assessment & Plan Note (Signed)
previously followed with Dr [name] for same -  On tx (?type) 2004-2010 when she "went into remission" reviewed new concern for autoimmune process Refer back to rheum for detailed review and reestablish tx as needed pred taper for skin flare (see lupus above) pending derm/rheum follow up

## 2013-06-14 NOTE — Assessment & Plan Note (Signed)
BP Readings from Last 3 Encounters:  06/13/13 124/70  06/02/13 136/84  05/27/13 180/85   White coat component reviewed - variable med compliance -  reviewed importance of med compliance as prescribed The current medical regimen is effective;  continue present plan and medications.

## 2013-06-14 NOTE — Assessment & Plan Note (Signed)
Ongoing unintentional weight loss trend since 2010 -  Prev 130# (2010) > now 116# (05/2013) ?related to autoimmune process Other risks including smoking, but no proven malignancy Need to arrange age appropriate screening (mammo, colo, PAP), but pt asks delay in scheduling at this time given recent MI and ongoing eval for uncontrolled lupus rash

## 2013-06-14 NOTE — Assessment & Plan Note (Signed)
Hx NSTEMI 07/2009 - AMI inferior wall 04/2013 with DES to CFX Currently, no anginal symptoms Reminded of need for med compliance and keep follow up with cards as ongoing 

## 2013-06-17 ENCOUNTER — Telehealth: Payer: Self-pay | Admitting: *Deleted

## 2013-06-17 NOTE — Telephone Encounter (Signed)
Please let patient that I have prescribed a 12 day taper rather than her usual six-day taper - that is why there are more pills and different dosing instructions. I recommend patient take 12 day taper as prescribed originally. However, if patient prefers six-day taper, let me know and I will change her prescription

## 2013-06-17 NOTE — Telephone Encounter (Signed)
Pt called states the Prednisone; Sterapred 48 cnt pak is not the correct Rx.  Pt states she has taken the 24 cnt in the past once daily not QID.  Please advise

## 2013-06-17 NOTE — Telephone Encounter (Signed)
Pt states she does prefer the 12 day taper but with less tablets, she usually takes: 3 tabs x 4 days, 2 tabs x 4 days, 1 tab x 4 days.  That is the dosing she prefers.  Please advise

## 2013-06-18 ENCOUNTER — Encounter: Payer: Self-pay | Admitting: Internal Medicine

## 2013-06-18 MED ORDER — PREDNISONE 20 MG PO TABS: ORAL_TABLET | ORAL | Status: DC

## 2013-06-18 NOTE — Telephone Encounter (Signed)
Okay, prednisone changed as requested. Erx done

## 2013-06-18 NOTE — Telephone Encounter (Signed)
Spoke with pt advised of MDs message 

## 2013-06-26 ENCOUNTER — Ambulatory Visit: Payer: Medicare Other

## 2013-07-04 ENCOUNTER — Ambulatory Visit (INDEPENDENT_AMBULATORY_CARE_PROVIDER_SITE_OTHER): Payer: Medicare Other | Admitting: Internal Medicine

## 2013-07-04 ENCOUNTER — Encounter: Payer: Self-pay | Admitting: Internal Medicine

## 2013-07-04 VITALS — BP 120/68 | HR 74 | Temp 98.4°F | Wt 119.4 lb

## 2013-07-04 DIAGNOSIS — M329 Systemic lupus erythematosus, unspecified: Secondary | ICD-10-CM

## 2013-07-04 DIAGNOSIS — I2510 Atherosclerotic heart disease of native coronary artery without angina pectoris: Secondary | ICD-10-CM

## 2013-07-04 DIAGNOSIS — Z23 Encounter for immunization: Secondary | ICD-10-CM

## 2013-07-04 NOTE — Progress Notes (Signed)
Pre visit review using our clinic review tool, if applicable. No additional management support is needed unless otherwise documented below in the visit note. 

## 2013-07-04 NOTE — Patient Instructions (Signed)
It was good to see you today.  Your annual flu shot was given and/or updated today.  We have reviewed your prior records including labs and tests today  Medications reviewed and updated, no changes recommended at this time.  Please schedule followup in 4 months for annual wellness and labs, call sooner if problems.

## 2013-07-04 NOTE — Assessment & Plan Note (Signed)
Hx NSTEMI 07/2009 - AMI inferior wall 04/2013 with DES to CFX Currently, no anginal symptoms Reminded of need for med compliance and keep follow up with cards as ongoing 

## 2013-07-04 NOTE — Progress Notes (Signed)
Subjective:    Patient ID: Mercedes [name], female    DOB: 04-13-1956, 57 y.o.   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" per dermatology eval in [place] (dr Stefanie Libel, no records available at time of OV) - has been on pred taper (interrupted by hosp for MI),  with recurrence of rash in past 72h since completing pred 10 days ago - planned follow up with derm in next 3 weeks, no rheumatologist visit in >5 years (since tx for RA by dr [name])  CAD - inf MI with DES to RCA 04/2013 - cath and recent DC summary reviewed - prior NQWMI 07/2009 with transient tako-tsubo CM, resolved - currently, no angina - reports med compliance with rx'd regimen (plavix, ASA, beta-blocker, ARB, statin)  Weight loss, unintentional - ongoing since ?2010 when reports weighed #130. Reviewed trend: #128 (05/2010), #124 (08/2011), #117 (04/2013). Denies BRBPR, hemopytsis or PU/PD  hypertension - the patient reports variable compliance with medication(s) as prescribed. Denies adverse side effects.    Past Medical History  Diagnosis Date  . 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 PAD (peripheral artery disease)     eval by Dr. [name] in past but patient canceled angiogram  . RA (rheumatoid arthritis)     prior tx by [name]  . GERD (gastroesophageal reflux disease)   . Dizziness     ? CVA 01/2010 - carotid dopplers with no ICA stenosis  . Arnold-Chiari malformation, type I     MRI brain 02/2010  . Bradycardia   . Tobacco abuse     quit 04/2013  . DEPRESSION   . HYPERLIPIDEMIA   . HYPERTENSION   . CAD (coronary artery disease)     NSTEMI 07/2009: LHC - D2 40%, LAD irreg., EF 50% with  apical AK (tako-tsubo CM);  Inf STEMI (04/2013): Tx Promus DES to CFX  . SLE (systemic lupus erythematosus) dx 05/2013  . Sjogren's syndrome 06/02/13   Family History  Problem Relation Age of Onset  . Arthritis Mother   . Arthritis Father   . Diabetes Other    History  Substance Use Topics  . Smoking status: Former Smoker    Types: Cigarettes    Quit date: 05/19/2013  . Smokeless tobacco: Former Neurosurgeon    Quit date: 05/19/2013     Comment:    . Alcohol Use: Yes   Review of Systems  Constitutional: Positive for unexpected weight change. Negative for fever, appetite change and fatigue.  Respiratory: Negative for cough, chest tightness, shortness of breath and wheezing.   Cardiovascular: Negative for chest pain, palpitations and leg swelling.  Musculoskeletal: Positive for arthralgias. Negative for back pain and myalgias.  Skin: Positive for rash. Negative for color change and wound.        Objective:   Physical Exam BP 120/68  Pulse 74  Temp(Src) 98.4 F (36.9 C) (Oral)  Wt 119 lb 6.4 oz (54.159 kg)  SpO2 97% Wt Readings from Last 3 Encounters:  07/04/13 119 lb 6.4 oz (54.159 kg)  06/13/13 116 lb 6.4 oz (52.799 kg)  06/02/13 117 lb (53.071 kg)   Constitutional:  She is thin, appears well-developed and well-nourished. No distress. Gdtr at side Cardiovascular: Normal rate, regular rhythm and normal heart sounds.  No murmur heard. No BLE edema. Pulmonary/Chest: Effort normal and breath sounds normal. No respiratory distress. She has no wheezes.  Neurological: She is alert and oriented to person, place, and time. No cranial nerve deficit. Coordination normal.  Skin: improved, mild maculopapular rash, improved discoid lesions on trunk chest> back and proximal shoulders>forearms - palms involved with deep erythematous changes of finger tips. violeacous coloration over bridge of nose. Skin is warm and dry. No erythema.  Psychiatric: She has a normal mood and affect. Her behavior is  normal. Judgment and thought content normal.   Lab Results  Component Value Date   WBC 9.7 05/21/2013   HGB 13.5 05/21/2013   HCT 39.5 05/21/2013   PLT 297 05/21/2013   GLUCOSE 97 05/27/2013   CHOL 175 05/20/2013   TRIG 196* 05/20/2013   HDL 58 05/20/2013   LDLCALC 78 05/20/2013   ALT 12 05/12/2013   AST 20 05/12/2013   NA 135 05/27/2013   K 4.2 05/27/2013   CL 98 05/27/2013   CREATININE 0.94 05/27/2013   BUN 16 05/27/2013   CO2 26 05/27/2013   TSH 0.47 08/03/2011   INR 0.99 08/16/2009        Assessment & Plan:   See problem list. Medications and labs reviewed today.

## 2013-07-04 NOTE — Assessment & Plan Note (Signed)
Dx by derm/bx - Dr Stefanie Libel in [facility] ([facility] "skin only" per pt S/p rheum eval Dareen Piano) today - dx confirmed - on Plaquenil and Pred remote pt of Dr [name] for RA

## 2013-07-21 ENCOUNTER — Other Ambulatory Visit: Payer: Medicare Other

## 2013-07-21 ENCOUNTER — Ambulatory Visit (INDEPENDENT_AMBULATORY_CARE_PROVIDER_SITE_OTHER): Payer: Medicare Other | Admitting: Cardiology

## 2013-07-21 ENCOUNTER — Other Ambulatory Visit: Payer: Self-pay | Admitting: *Deleted

## 2013-07-21 ENCOUNTER — Encounter: Payer: Self-pay | Admitting: Cardiology

## 2013-07-21 VITALS — BP 186/94 | HR 62 | Ht 66.0 in | Wt 126.0 lb

## 2013-07-21 DIAGNOSIS — Z0000 Encounter for general adult medical examination without abnormal findings: Secondary | ICD-10-CM

## 2013-07-21 DIAGNOSIS — I2510 Atherosclerotic heart disease of native coronary artery without angina pectoris: Secondary | ICD-10-CM

## 2013-07-21 DIAGNOSIS — F17200 Nicotine dependence, unspecified, uncomplicated: Secondary | ICD-10-CM

## 2013-07-21 DIAGNOSIS — I739 Peripheral vascular disease, unspecified: Secondary | ICD-10-CM

## 2013-07-21 DIAGNOSIS — E785 Hyperlipidemia, unspecified: Secondary | ICD-10-CM

## 2013-07-21 LAB — HEPATIC FUNCTION PANEL
ALT: 22 U/L (ref 0–35)
AST: 15 U/L (ref 0–37)
Albumin: 3.6 g/dL (ref 3.5–5.2)
Alkaline Phosphatase: 66 U/L (ref 39–117)
Total Bilirubin: 0.5 mg/dL (ref 0.3–1.2)
Total Protein: 6.9 g/dL (ref 6.0–8.3)

## 2013-07-21 LAB — LIPID PANEL
Cholesterol: 150 mg/dL (ref 0–200)
LDL Cholesterol: 33 mg/dL (ref 0–99)
Triglycerides: 51 mg/dL (ref 0.0–149.0)
VLDL: 10.2 mg/dL (ref 0.0–40.0)

## 2013-07-21 MED ORDER — ATORVASTATIN CALCIUM 80 MG PO TABS: 80.0000 mg | ORAL_TABLET | Freq: Every day | ORAL | Status: DC

## 2013-07-21 MED ORDER — ATORVASTATIN CALCIUM 40 MG PO TABS: ORAL_TABLET | ORAL | Status: DC

## 2013-07-21 NOTE — Assessment & Plan Note (Signed)
Change Lipitor to 80 mg daily.check lipids and liver in 6 weeks.

## 2013-07-21 NOTE — Assessment & Plan Note (Signed)
Continue dual antiplatelet therapy and statin   

## 2013-07-21 NOTE — Patient Instructions (Signed)
Your physician recommends that you schedule a follow-up appointment in: 3 MONTHS WITH DR CRENSHAW  INCREASE LIPITOR TO 80 MG ONCE DAILY  Your physician recommends that you return for lab work in: 6 WEEKS- DO NOT EAT PRIOR TO LAB WORK

## 2013-07-21 NOTE — Assessment & Plan Note (Signed)
Patient counseled on discontinuing and she has decreased her use significantly.

## 2013-07-21 NOTE — Addendum Note (Signed)
Addended by: [name] on: 07/21/2013 10:32 AM   Modules accepted: Orders

## 2013-07-21 NOTE — Progress Notes (Signed)
HPI: followup coronary artery disease. A cardiac catheterization was performed on August 16, 2009. There was a 40% LAD but no obstructive disease was noted. The ejection fraction was 50% and there was apical ballooning. This was felt consistent with Tako-Tsubo CM. Note the renal arteries were patent. Followup echocardiogram in February of 2011 revealed an ejection fraction of 50-55% with mild apical hypokinesis. There was mild mitral regurgitation. Carotid Dopplers in June of 2011 were negative. MRA in July of 2011 secondary to dizziness was also negative for carotid disease. She has been seen by Dr. [name] for abnormal ABIs and claudication. Peripheral arteriogram was scheduled but was canceled as her mother was ill and never rescheduled. 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 LHC (05/19/13): mLAD 50, pD1 40-50, pCFX 75 (culprit), OM with 2 subbranches with abrupt cessation of flow suggestive of embolic event, and pRCA 50-60, mRCA 50-60; faint collaterals from RCA to dCFX; EF 55%, inferior AK. PCI: Promus premier (3.5x16 mm) DES to pCFX. Echo (05/20/13): EF 55-60%, mid to distal lateral wall HK, grade 1 diastolic dysfunction, trivial MR, trivial TR. No beta blocker was started due to bradycardia. Since she was last seen she has dyspnea when turning certain ways. However she has not had dyspnea with activities. No orthopnea, PND or pedal edema. One episode of chest pain during sex. However it was unlike her infarct pain and she is not having exertional chest pain otherwise.     Current Outpatient Prescriptions  Medication Sig Dispense Refill  . aspirin 81 MG tablet Take 1 tablet (81 mg total) by mouth daily.  30 tablet    . atorvastatin (LIPITOR) 40 MG tablet Take 1 tablet (40 mg total) by mouth daily.  30 tablet  11  . hydroxychloroquine (PLAQUENIL) 200 MG tablet Take 1 tablet (200 mg total) by mouth daily.      [name] lisinopril (PRINIVIL,ZESTRIL) 20 MG tablet Take 1 tablet (20  mg total) by mouth daily.  90 tablet  3  . nitroGLYCERIN (NITROSTAT) 0.4 MG SL tablet Place 1 tablet (0.4 mg total) under the tongue every 5 (five) minutes as needed for chest pain.  25 tablet  3  . predniSONE (DELTASONE) 10 MG tablet Take 3 tablets (30 mg total) by mouth daily with breakfast.      . Ticagrelor (BRILINTA) 90 MG TABS tablet Take 1 tablet (90 mg total) by mouth 2 (two) times daily.  60 tablet  11   No current facility-administered medications for this visit.     Past Medical History  Diagnosis Date  . 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 PAD (peripheral artery disease)     eval by Dr. [name] in past but patient canceled angiogram  . RA (rheumatoid arthritis)     prior tx by [name]  . GERD (gastroesophageal reflux disease)   . Dizziness     ? CVA 01/2010 - carotid dopplers with no ICA stenosis  . Arnold-Chiari malformation, type I     MRI brain 02/2010  . Bradycardia   . Tobacco abuse     quit 04/2013  . DEPRESSION   . HYPERLIPIDEMIA   . HYPERTENSION   . CAD (coronary artery disease)     NSTEMI 07/2009: LHC - D2 40%, LAD irreg., EF 50% with apical AK (tako-tsubo CM);  Inf STEMI (04/2013): Tx Promus DES to CFX  . SLE (systemic lupus erythematosus)  dx 05/2013  . Sjogren's syndrome 06/02/13    Past Surgical History  Procedure Laterality Date  . None      History   Social History  . Marital Status: Single    Spouse Name: N/A    Number of Children: 3  . Years of Education: N/A   Occupational History  . Not on file.   Social History Main Topics  . Smoking status: Former Smoker    Types: Cigarettes    Quit date: 05/19/2013  . Smokeless tobacco: Former Neurosurgeon    Quit date: 05/19/2013     Comment:    . Alcohol Use: Yes  . Drug Use: No  . Sexual Activity: Not on file   Other Topics Concern  . Not on file   Social History Narrative   Lives alone.    ROS: no fevers or chills,  productive cough, hemoptysis, dysphasia, odynophagia, melena, hematochezia, dysuria, hematuria, rash, seizure activity, orthopnea, PND, pedal edema, claudication. Remaining systems are negative.  Physical Exam: Well-developed well-nourished in no acute distress.  Skin is warm and dry.  HEENT is normal.  Neck is supple.  Chest is clear to auscultation with normal expansion.  Cardiovascular exam is regular rate and rhythm.  Abdominal exam nontender or distended. No masses palpated. Extremities show no edema. neuro grossly intact  ECG sinus rhythm at a rate of 62. Occasional PVC. Left axis deviation. RV conduction delay.

## 2013-07-21 NOTE — Assessment & Plan Note (Signed)
Continue aspirin and statin. Her symptoms are not significantly limiting at this point. I will consider referral to Dr. Arida in the future if symptoms worsen.  

## 2013-07-21 NOTE — Assessment & Plan Note (Signed)
Blood pressure is mildly elevated but she follows this at home and it is typically controlled. She will continue to monitor in our increase lisinopril if needed.

## 2013-08-07 ENCOUNTER — Encounter: Payer: Self-pay | Admitting: Internal Medicine

## 2013-08-07 ENCOUNTER — Ambulatory Visit (INDEPENDENT_AMBULATORY_CARE_PROVIDER_SITE_OTHER): Payer: Medicare Other | Admitting: Internal Medicine

## 2013-08-07 VITALS — BP 142/90 | HR 83 | Temp 99.4°F | Wt 122.6 lb

## 2013-08-07 DIAGNOSIS — M109 Gout, unspecified: Secondary | ICD-10-CM

## 2013-08-07 DIAGNOSIS — R0600 Dyspnea, unspecified: Secondary | ICD-10-CM

## 2013-08-07 DIAGNOSIS — M329 Systemic lupus erythematosus, unspecified: Secondary | ICD-10-CM

## 2013-08-07 DIAGNOSIS — R0609 Other forms of dyspnea: Secondary | ICD-10-CM

## 2013-08-07 MED ORDER — PREDNISONE (PAK) 10 MG PO TABS: ORAL_TABLET | ORAL | Status: DC

## 2013-08-07 NOTE — Patient Instructions (Addendum)
It was good to see you today.  We have reviewed your prior records including labs and tests today  Test(s) ordered today. Your results will be released to MyChart (or called to you) after review, usually within 72hours after test completion. If any changes need to be made, you will be notified at that same time.  Medications reviewed and updated: resume prednisone for six-day taper to treat gout flare/toe pain -and we'll consider colcrys once daily to prevent gout flare depending on your lab work  Your prescription(s) have been submitted to your pharmacy. Please take as directed and contact our office if you believe you are having problem(s) with the medication(s).  Gout Gout is an inflammatory arthritis caused by a buildup of uric acid crystals in the joints. Uric acid is a chemical that is normally present in the blood. When the level of uric acid in the blood is too high it can form crystals that deposit in your joints and tissues. This causes joint redness, soreness, and swelling (inflammation). Repeat attacks are common. Over time, uric acid crystals can form into masses (tophi) near a joint, destroying bone and causing disfigurement. Gout is treatable and often preventable. CAUSES  The disease begins with elevated levels of uric acid in the blood. Uric acid is produced by your body when it breaks down a naturally found substance called purines. Certain foods you eat, such as meats and fish, contain high amounts of purines. Causes of an elevated uric acid level include:  Being passed down from parent to child (heredity).  Diseases that cause increased uric acid production (such as obesity, psoriasis, and certain cancers).  Excessive alcohol use.  Diet, especially diets rich in meat and seafood.  Medicines, including certain cancer-fighting medicines (chemotherapy), water pills (diuretics), and aspirin.  Chronic kidney disease. The kidneys are no longer able to remove uric acid  well.  Problems with metabolism. Conditions strongly associated with gout include:  Obesity.  High blood pressure.  High cholesterol.  Diabetes. Not everyone with elevated uric acid levels gets gout. It is not understood why some people get gout and others do not. Surgery, joint injury, and eating too much of certain foods are some of the factors that can lead to gout attacks. SYMPTOMS   An attack of gout comes on quickly. It causes intense pain with redness, swelling, and warmth in a joint.  Fever can occur.  Often, only one joint is involved. Certain joints are more commonly involved:  Base of the big toe.  Knee.  Ankle.  Wrist.  Finger. Without treatment, an attack usually goes away in a few days to weeks. Between attacks, you usually will not have symptoms, which is different from many other forms of arthritis. DIAGNOSIS  Your caregiver will suspect gout based on your symptoms and exam. In some cases, tests may be recommended. The tests may include:  Blood tests.  Urine tests.  X-rays.  Joint fluid exam. This exam requires a needle to remove fluid from the joint (arthrocentesis). Using a microscope, gout is confirmed when uric acid crystals are seen in the joint fluid. TREATMENT  There are two phases to gout treatment: treating the sudden onset (acute) attack and preventing attacks (prophylaxis).  Treatment of an Acute Attack.  Medicines are used. These include anti-inflammatory medicines or steroid medicines.  An injection of steroid medicine into the affected joint is sometimes necessary.  The painful joint is rested. Movement can worsen the arthritis.  You may use warm or cold treatments  on painful joints, depending which works best for you.  Treatment to Prevent Attacks.  If you suffer from frequent gout attacks, your caregiver may advise preventive medicine. These medicines are started after the acute attack subsides. These medicines either help your  kidneys eliminate uric acid from your body or decrease your uric acid production. You may need to stay on these medicines for a very long time.  The early phase of treatment with preventive medicine can be associated with an increase in acute gout attacks. For this reason, during the first few months of treatment, your caregiver may also advise you to take medicines usually used for acute gout treatment. Be sure you understand your caregiver's directions. Your caregiver may make several adjustments to your medicine dose before these medicines are effective.  Discuss dietary treatment with your caregiver or dietitian. Alcohol and drinks high in sugar and fructose and foods such as meat, poultry, and seafood can increase uric acid levels. Your caregiver or dietician can advise you on drinks and foods that should be limited. HOME CARE INSTRUCTIONS   Do not take aspirin to relieve pain. This raises uric acid levels.  Only take over-the-counter or prescription medicines for pain, discomfort, or fever as directed by your caregiver.  Rest the joint as much as possible. When in bed, keep sheets and blankets off painful areas.  Keep the affected joint raised (elevated).  Apply warm or cold treatments to painful joints. Use of warm or cold treatments depends on which works best for you.  Use crutches if the painful joint is in your leg.  Drink enough fluids to keep your urine clear or pale yellow. This helps your body get rid of uric acid. Limit alcohol, sugary drinks, and fructose drinks.  Follow your dietary instructions. Pay careful attention to the amount of protein you eat. Your daily diet should emphasize fruits, vegetables, whole grains, and fat-free or low-fat milk products. Discuss the use of coffee, vitamin C, and cherries with your caregiver or dietician. These may be helpful in lowering uric acid levels.  Maintain a healthy body weight. SEEK MEDICAL CARE IF:   You develop diarrhea,  vomiting, or any side effects from medicines.  You do not feel better in 24 hours, or you are getting worse. SEEK IMMEDIATE MEDICAL CARE IF:   Your joint becomes suddenly more tender, and you have chills or a fever. MAKE SURE YOU:   Understand these instructions.  Will watch your condition.  Will get help right away if you are not doing well or get worse. Document Released: 08/11/2000 Document Revised: 12/09/2012 Document Reviewed: 03/27/2012 [facility] Patient Information 2014 [place], [place].

## 2013-08-07 NOTE — Assessment & Plan Note (Signed)
Dx by derm/bx - Dr Stefanie Libel in [facility] ([facility]) "skin only" per pt S/p rheum eval Dareen Piano) today - dx confirmed - on Plaquenil  off Pred x 1 week - resume x 6 day burst taper remote pt of Dr [name] for R

## 2013-08-07 NOTE — Progress Notes (Signed)
Pre-visit discussion using our clinic review tool. No additional management support is needed unless otherwise documented below in the visit note.  

## 2013-08-07 NOTE — Progress Notes (Signed)
Subjective:    Patient ID: Mercedes [name], female    DOB: Nov 30, 1955, 57 y.o.   MRN: [id number]  HPI  Here for foot problem - L great toe x 1 week Off pred x 1 week No hx gout  Also reviewed chronic medical issues and interval medical events  Past Medical History  Diagnosis Date  . 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 PAD (peripheral artery disease)     eval by Dr. [name] in past but patient canceled angiogram  . RA (rheumatoid arthritis)     prior tx by [name]  . GERD (gastroesophageal reflux disease)   . Dizziness     ? CVA 01/2010 - carotid dopplers with no ICA stenosis  . Arnold-Chiari malformation, type I     MRI brain 02/2010  . Bradycardia   . Tobacco abuse     quit 04/2013  . DEPRESSION   . HYPERLIPIDEMIA   . HYPERTENSION   . CAD (coronary artery disease)     NSTEMI 07/2009: LHC - D2 40%, LAD irreg., EF 50% with apical AK (tako-tsubo CM);  Inf STEMI (04/2013): Tx Promus DES to CFX  . SLE (systemic lupus erythematosus) dx 05/2013  . Sjogren's syndrome 06/02/13    Review of Systems  Constitutional: Positive for unexpected weight change. Negative for fever, appetite change and fatigue.  Respiratory: Positive for shortness of breath. Negative for cough, chest tightness and wheezing.   Cardiovascular: Negative for chest pain, palpitations and leg swelling.  Musculoskeletal: Positive for arthralgias and joint swelling (L great toe). Negative for back pain and myalgias.  Skin: Negative for color change, rash and wound.        Objective:   Physical Exam BP 142/90  Pulse 83  Temp(Src) 99.4 F (37.4 C) (Oral)  Wt 122 lb 9.6 oz (55.611 kg)  SpO2 98% Wt Readings from Last 3 Encounters:  08/07/13 122 lb 9.6 oz (55.611 kg)  07/21/13 126 lb (57.153 kg)  07/04/13 119 lb 6.4 oz (54.159 kg)   Constitutional: She is thin, appears well-developed and well-nourished. No distress.  Cardiovascular:  Normal rate, regular rhythm and normal heart sounds.  No murmur heard. No BLE edema. Pulmonary/Chest: Effort normal and breath sounds normal. No respiratory distress. She has no wheezes.  Musculoskeletal: sausage looking L great toe - swelling and mild red/warmth - tender to touch at PIP and MTP - no skin ulceration but skin breakdown/crack on heels R>L Neurological: She is alert and oriented to person, place, and time. No cranial nerve deficit. Coordination normal.  Skin: rash has resolved. Skin is warm and dry. No erythema.  Psychiatric: She has a normal mood and affect. Her behavior is normal. Judgment and thought content normal.   Lab Results  Component Value Date   WBC 9.7 05/21/2013   HGB 13.5 05/21/2013   HCT 39.5 05/21/2013   PLT 297 05/21/2013   GLUCOSE 97 05/27/2013   CHOL 150 07/21/2013   TRIG 51.0 07/21/2013   HDL 107.30 07/21/2013   LDLCALC 33 07/21/2013   ALT 22 07/21/2013   AST 15 07/21/2013   NA 135 05/27/2013   K 4.2 05/27/2013   CL 98 05/27/2013   CREATININE 0.94 05/27/2013   BUN 16 05/27/2013   CO2 26 05/27/2013   TSH 0.47 08/03/2011   INR 0.99 08/16/2009        Assessment & Plan:   L great toe pain consistent with  gout on exam Increase symptoms since off pred x 1 week  Dyspnea with normal O2 and clear lungs  Check uric acid level and resume pred taper Also check CBC and BMet - begin colchicine if Cr Cl good  Also see problem list. Medications and labs reviewed today.

## 2013-08-08 ENCOUNTER — Other Ambulatory Visit (INDEPENDENT_AMBULATORY_CARE_PROVIDER_SITE_OTHER): Payer: Medicare Other

## 2013-08-08 DIAGNOSIS — M329 Systemic lupus erythematosus, unspecified: Secondary | ICD-10-CM

## 2013-08-08 DIAGNOSIS — M109 Gout, unspecified: Secondary | ICD-10-CM

## 2013-08-08 DIAGNOSIS — R0989 Other specified symptoms and signs involving the circulatory and respiratory systems: Secondary | ICD-10-CM

## 2013-08-08 DIAGNOSIS — R0609 Other forms of dyspnea: Secondary | ICD-10-CM

## 2013-08-08 DIAGNOSIS — R0600 Dyspnea, unspecified: Secondary | ICD-10-CM

## 2013-08-08 LAB — BASIC METABOLIC PANEL
BUN: 10 mg/dL (ref 6–23)
CO2: 28 mEq/L (ref 19–32)
Creatinine, Ser: 0.9 mg/dL (ref 0.4–1.2)
GFR: 79.21 mL/min (ref >60.00)
Glucose, Bld: 80 mg/dL (ref 70–99)
Potassium: 3.7 mEq/L (ref 3.5–5.1)

## 2013-08-08 LAB — CBC WITH DIFFERENTIAL/PLATELET
Basophils Relative: 1.2 % (ref 0.0–3.0)
Eosinophils Absolute: 0.1 10*3/uL (ref 0.0–0.7)
Eosinophils Relative: 2.8 % (ref 0.0–5.0)
HCT: 39.2 % (ref 36.0–46.0)
Hemoglobin: 12.9 g/dL (ref 12.0–15.0)
MCHC: 32.9 g/dL (ref 30.0–36.0)
MCV: 80.4 fl (ref 78.0–100.0)
Monocytes Absolute: 0.6 10*3/uL (ref 0.1–1.0)
Neutro Abs: 2.7 10*3/uL (ref 1.4–7.7)
Neutrophils Relative %: 53.1 % (ref 43.0–77.0)
RBC: 4.87 Mil/uL (ref 3.87–5.11)
WBC: 5 10*3/uL (ref 4.5–10.5)

## 2013-08-08 LAB — URIC ACID: Uric Acid, Serum: 4.9 mg/dL (ref 2.4–7.0)

## 2013-08-12 ENCOUNTER — Telehealth (HOSPITAL_COMMUNITY): Payer: Self-pay | Admitting: Cardiac Rehabilitation

## 2013-08-12 NOTE — Telephone Encounter (Signed)
Pt was contacted to enroll in cardiac rehab program. Pt declined, she is exercising on her own.

## 2013-09-02 ENCOUNTER — Ambulatory Visit (INDEPENDENT_AMBULATORY_CARE_PROVIDER_SITE_OTHER): Payer: Medicare Other | Admitting: *Deleted

## 2013-09-02 ENCOUNTER — Encounter (HOSPITAL_COMMUNITY): Payer: Self-pay | Admitting: Emergency Medicine

## 2013-09-02 ENCOUNTER — Other Ambulatory Visit (INDEPENDENT_AMBULATORY_CARE_PROVIDER_SITE_OTHER): Payer: Medicare Other

## 2013-09-02 ENCOUNTER — Encounter: Payer: Self-pay | Admitting: Cardiology

## 2013-09-02 ENCOUNTER — Observation Stay (HOSPITAL_COMMUNITY)
Admission: EM | Admit: 2013-09-02 | Discharge: 2013-09-03 | Disposition: A | Discharge status: Home / Self Care | Payer: Medicare Other | Attending: Internal Medicine | Admitting: Internal Medicine

## 2013-09-02 ENCOUNTER — Emergency Department (HOSPITAL_COMMUNITY): Payer: Medicare Other

## 2013-09-02 VITALS — BP 124/80 | HR 63 | Ht 66.0 in | Wt 118.0 lb

## 2013-09-02 DIAGNOSIS — R079 Chest pain, unspecified: Secondary | ICD-10-CM

## 2013-09-02 DIAGNOSIS — M3500 Sicca syndrome, unspecified: Secondary | ICD-10-CM | POA: Insufficient documentation

## 2013-09-02 DIAGNOSIS — Z87891 Personal history of nicotine dependence: Secondary | ICD-10-CM | POA: Insufficient documentation

## 2013-09-02 DIAGNOSIS — I2510 Atherosclerotic heart disease of native coronary artery without angina pectoris: Secondary | ICD-10-CM

## 2013-09-02 DIAGNOSIS — Z9861 Coronary angioplasty status: Secondary | ICD-10-CM | POA: Insufficient documentation

## 2013-09-02 DIAGNOSIS — K219 Gastro-esophageal reflux disease without esophagitis: Secondary | ICD-10-CM | POA: Diagnosis present

## 2013-09-02 DIAGNOSIS — I5181 Takotsubo syndrome: Secondary | ICD-10-CM | POA: Insufficient documentation

## 2013-09-02 DIAGNOSIS — R0789 Other chest pain: Principal | ICD-10-CM | POA: Insufficient documentation

## 2013-09-02 DIAGNOSIS — R7989 Other specified abnormal findings of blood chemistry: Secondary | ICD-10-CM

## 2013-09-02 DIAGNOSIS — E1169 Type 2 diabetes mellitus with other specified complication: Secondary | ICD-10-CM | POA: Diagnosis present

## 2013-09-02 DIAGNOSIS — R945 Abnormal results of liver function studies: Secondary | ICD-10-CM

## 2013-09-02 DIAGNOSIS — F329 Major depressive disorder, single episode, unspecified: Secondary | ICD-10-CM | POA: Diagnosis present

## 2013-09-02 DIAGNOSIS — Z7982 Long term (current) use of aspirin: Secondary | ICD-10-CM | POA: Insufficient documentation

## 2013-09-02 DIAGNOSIS — M069 Rheumatoid arthritis, unspecified: Secondary | ICD-10-CM | POA: Diagnosis present

## 2013-09-02 DIAGNOSIS — I200 Unstable angina: Secondary | ICD-10-CM

## 2013-09-02 DIAGNOSIS — I152 Hypertension secondary to endocrine disorders: Secondary | ICD-10-CM | POA: Diagnosis present

## 2013-09-02 DIAGNOSIS — F17200 Nicotine dependence, unspecified, uncomplicated: Secondary | ICD-10-CM | POA: Diagnosis present

## 2013-09-02 DIAGNOSIS — IMO0002 Reserved for concepts with insufficient information to code with codable children: Secondary | ICD-10-CM | POA: Insufficient documentation

## 2013-09-02 DIAGNOSIS — I10 Essential (primary) hypertension: Secondary | ICD-10-CM | POA: Insufficient documentation

## 2013-09-02 DIAGNOSIS — E785 Hyperlipidemia, unspecified: Secondary | ICD-10-CM | POA: Diagnosis present

## 2013-09-02 DIAGNOSIS — G935 Compression of brain: Secondary | ICD-10-CM | POA: Insufficient documentation

## 2013-09-02 DIAGNOSIS — Z79899 Other long term (current) drug therapy: Secondary | ICD-10-CM | POA: Insufficient documentation

## 2013-09-02 DIAGNOSIS — M329 Systemic lupus erythematosus, unspecified: Secondary | ICD-10-CM | POA: Diagnosis present

## 2013-09-02 DIAGNOSIS — I739 Peripheral vascular disease, unspecified: Secondary | ICD-10-CM | POA: Diagnosis present

## 2013-09-02 DIAGNOSIS — F3289 Other specified depressive episodes: Secondary | ICD-10-CM | POA: Diagnosis present

## 2013-09-02 HISTORY — DX: Other specified abnormal findings of blood chemistry: R79.89

## 2013-09-02 HISTORY — DX: Abnormal results of liver function studies: R94.5

## 2013-09-02 LAB — TROPONIN I: Troponin I: <0.3 ng/mL (ref <0.30)

## 2013-09-02 LAB — HEPATIC FUNCTION PANEL
ALK PHOS: 131 U/L — AB (ref 39–117)
ALT: 57 U/L — AB (ref 0–35)
AST: 41 U/L — ABNORMAL HIGH (ref 0–37)
Albumin: 3.6 g/dL (ref 3.5–5.2)
Bilirubin, Direct: 0 mg/dL (ref 0.0–0.3)
TOTAL PROTEIN: 6.9 g/dL (ref 6.0–8.3)
Total Bilirubin: 0.2 mg/dL — ABNORMAL LOW (ref 0.3–1.2)

## 2013-09-02 LAB — CBC
HEMATOCRIT: 42 % (ref 36.0–46.0)
HEMOGLOBIN: 13.7 g/dL (ref 12.0–15.0)
MCH: 26.9 pg (ref 26.0–34.0)
MCHC: 32.6 g/dL (ref 30.0–36.0)
MCV: 82.5 fL (ref 78.0–100.0)
Platelets: 236 10*3/uL (ref 150–400)
RBC: 5.09 MIL/uL (ref 3.87–5.11)
RDW: 16.1 % — ABNORMAL HIGH (ref 11.5–15.5)
WBC: 5.2 10*3/uL (ref 4.0–10.5)

## 2013-09-02 LAB — LIPID PANEL
CHOLESTEROL: 107 mg/dL (ref 0–200)
HDL: 47.6 mg/dL (ref >39.00)
LDL CALC: 26 mg/dL (ref 0–99)
Total CHOL/HDL Ratio: 2
Triglycerides: 167 mg/dL — ABNORMAL HIGH (ref 0.0–149.0)
VLDL: 33.4 mg/dL (ref 0.0–40.0)

## 2013-09-02 LAB — BASIC METABOLIC PANEL
BUN: 7 mg/dL (ref 6–23)
CHLORIDE: 103 meq/L (ref 96–112)
CO2: 22 mEq/L (ref 19–32)
CREATININE: 0.81 mg/dL (ref 0.50–1.10)
Calcium: 9.1 mg/dL (ref 8.4–10.5)
GFR calc Af Amer: >90 mL/min (ref >90)
GFR calc non Af Amer: 78 mL/min — ABNORMAL LOW (ref >90)
GLUCOSE: 94 mg/dL (ref 70–99)
Potassium: 4.1 mEq/L (ref 3.7–5.3)
Sodium: 140 mEq/L (ref 137–147)

## 2013-09-02 LAB — POCT I-STAT TROPONIN I: Troponin i, poc: 0.01 ng/mL (ref 0.00–0.08)

## 2013-09-02 LAB — PRO B NATRIURETIC PEPTIDE: Pro B Natriuretic peptide (BNP): 222.7 pg/mL — ABNORMAL HIGH (ref 0–125)

## 2013-09-02 MED ORDER — LISINOPRIL 20 MG PO TABS
20.0000 mg | ORAL_TABLET | Freq: Every day | ORAL | Status: DC
  Administered 2013-09-03: 20 mg via ORAL
  Filled 2013-09-02: qty 1

## 2013-09-02 MED ORDER — HEPARIN SODIUM (PORCINE) 5000 UNIT/ML IJ SOLN
5000.0000 [IU] | Freq: Three times a day (TID) | INTRAMUSCULAR | Status: DC
  Filled 2013-09-02 (×5): qty 1

## 2013-09-02 MED ORDER — ONDANSETRON HCL 4 MG/2ML IJ SOLN: 4.0000 mg | Freq: Four times a day (QID) | INTRAMUSCULAR | Status: DC | PRN

## 2013-09-02 MED ORDER — NITROGLYCERIN 2 % TD OINT
1.0000 [in_us] | TOPICAL_OINTMENT | Freq: Once | TRANSDERMAL | Status: DC
  Filled 2013-09-02: qty 1

## 2013-09-02 MED ORDER — ASPIRIN 81 MG PO CHEW
324.0000 mg | CHEWABLE_TABLET | Freq: Once | ORAL | Status: DC
  Filled 2013-09-02: qty 4

## 2013-09-02 MED ORDER — ACETAMINOPHEN 325 MG PO TABS: 650.0000 mg | ORAL_TABLET | ORAL | Status: DC | PRN

## 2013-09-02 MED ORDER — ATORVASTATIN CALCIUM 40 MG PO TABS
40.0000 mg | ORAL_TABLET | Freq: Every day | ORAL | Status: DC
  Filled 2013-09-02: qty 1

## 2013-09-02 MED ORDER — TICAGRELOR 90 MG PO TABS
90.0000 mg | ORAL_TABLET | Freq: Two times a day (BID) | ORAL | Status: DC
  Administered 2013-09-03: 90 mg via ORAL
  Filled 2013-09-02 (×3): qty 1

## 2013-09-02 MED ORDER — HYDROXYCHLOROQUINE SULFATE 200 MG PO TABS
200.0000 mg | ORAL_TABLET | Freq: Every day | ORAL | Status: DC
  Administered 2013-09-03: 200 mg via ORAL
  Filled 2013-09-02: qty 1

## 2013-09-02 MED ORDER — SODIUM CHLORIDE 0.9 % IV SOLN
INTRAVENOUS | Status: AC
Start: 2013-09-03 — End: 2013-09-03

## 2013-09-02 MED ORDER — ASPIRIN EC 81 MG PO TBEC
81.0000 mg | DELAYED_RELEASE_TABLET | Freq: Every day | ORAL | Status: DC
  Administered 2013-09-03: 81 mg via ORAL
  Filled 2013-09-02: qty 1

## 2013-09-02 MED ORDER — NITROGLYCERIN 0.4 MG SL SUBL: 0.4000 mg | SUBLINGUAL_TABLET | SUBLINGUAL | Status: DC | PRN

## 2013-09-02 NOTE — ED Notes (Signed)
Pt sts she "does not like the system" and is "not happy about waiting". RN explained what has been done for patient & updated patient on results. RN explained EKG and monitoring. Pt agreeable to response & thanking RN for explaining. Pt sts "I still don't

## 2013-09-02 NOTE — H&P (Addendum)
Cardiology History and Physical  Gwendolyn Grant, MD  History of Present Illness (and review of medical records): Mercedes [name] is a 58 y.o. female who presents for evaluation of chest pain.  She has known CAD followed by Dr. Stanford Breed.  She had Cath in 2010 which showed 40% LAD but no obstructive disease was noted. The ejection fraction was 50% and there was apical ballooning. This was felt consistent with Tako-Tsubo CM.  She then suffered an inferior infarct in September of 2014. LHC (05/19/13): mLAD 50, pD1 40-50, pCFX 75 (culprit), OM with 2 subbranches with abrupt cessation of flow suggestive of embolic event, and pRCA 63-01, mRCA 50-60; faint collaterals from RCA to dCFX; EF 55%, inferior AK. PCI: Promus premier (3.5x16 mm) DES to pCFX. Echo (05/20/13): EF 55-60%, mid to distal lateral wall HK, grade 1 diastolic dysfunction, trivial MR, trivial TR. No beta blocker was started due to bradycardia.   She presented to [facility] clinic today for lab work.  At that time she reported chest fullness.  She stated may have felt like indigestion.  She was unable to rate severity.    She also had neck pain with radiation to her upper back on right side.  She otherwise felt tired and had slept for most of the day until going to get lab work performed.  She was sent to ED for evaluation.  She stated the fullness continued in the ED and was relieved after taking NTG.  She was also given ASA and nitro paste.  She remains chest pain free.  Trop is negative and no acute changes on ecg.  Review of Systems She denies any viral illness.  She has quit smoking.  She denies any PND, orthopnea, lower extremity swelling.  She states she was recently diagnosed with lupus. Further review of systems was otherwise negative other than stated in HPI.  Patient Active Problem List   Diagnosis Date Noted  . Chest pain 09/02/2013  . SLE (systemic lupus erythematosus)   . Acute MI, inferior wall 05/19/2013  . Weight loss 08/03/2011   . 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 BRADYCARDIA 03/09/2010  . DEPRESSION 11/02/2009  . PERIPHERAL VASCULAR DISEASE 10/29/2009  . TOBACCO ABUSE 09/17/2009  . HYPERLIPIDEMIA 08/26/2009  . HYPERTENSION 08/26/2009  . CORONARY ATHEROSCLEROSIS NATIVE CORONARY ARTERY 08/26/2009  . CARDIOMYOPATHY, SECONDARY 08/26/2009  . GERD 08/26/2009  . RHEUMATOID ARTHRITIS 08/26/2009   Past Medical History  Diagnosis Date  . 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 PAD (peripheral artery disease)     eval by Dr. Angelena Form in past but patient canceled angiogram  . RA (rheumatoid arthritis)     prior tx by Justine Null  . GERD (gastroesophageal reflux disease)   . Dizziness     ? CVA 01/2010 - carotid dopplers with no ICA stenosis  . Arnold-Chiari malformation, type I     MRI brain 02/2010  . Bradycardia   . Tobacco abuse     quit 04/2013  . DEPRESSION   . HYPERLIPIDEMIA   . HYPERTENSION   . CAD (coronary artery disease)     NSTEMI 07/2009: LHC - D2 40%, LAD irreg., EF 50% with apical AK (tako-tsubo CM);  Inf STEMI (04/2013): Tx Promus DES to CFX  . SLE (systemic lupus erythematosus) dx 05/2013  . Sjogren's syndrome 06/02/13    Past Surgical History  Procedure Laterality Date  . None      Prescriptions  prior to admission  Medication Sig Dispense Refill  . aspirin 81 MG tablet Take 1 tablet (81 mg total) by mouth daily.  30 tablet    . atorvastatin (LIPITOR) 40 MG tablet Take 40 mg by mouth every morning.      . hydroxychloroquine (PLAQUENIL) 200 MG tablet Take 1 tablet (200 mg total) by mouth daily.      [name] lisinopril (PRINIVIL,ZESTRIL) 20 MG tablet Take 1 tablet (20 mg total) by mouth daily.  90 tablet  3  . nicotine (NICODERM CQ - DOSED IN MG/24 HOURS) 21 mg/24hr patch Place 21 mg onto the skin daily.      . nitroGLYCERIN (NITROSTAT) 0.4 MG SL tablet Place 1 tablet (0.4 mg total) under the tongue every 5 (five) minutes as needed  for chest pain.  25 tablet  3  . Ticagrelor (BRILINTA) 90 MG TABS tablet Take 1 tablet (90 mg total) by mouth 2 (two) times daily.  60 tablet  11   No Known Allergies  History  Substance Use Topics  . Smoking status: Former Smoker    Types: Cigarettes    Quit date: 05/19/2013  . Smokeless tobacco: Former Systems developer    Quit date: 05/19/2013     Comment:    . Alcohol Use: Yes    Family History  Problem Relation Age of Onset  . Arthritis Mother   . Arthritis Father   . Diabetes Other      Objective:  Patient Vitals for the past 8 hrs:  BP Temp Temp src Pulse Resp SpO2  09/03/13 0530 122/79 mmHg 98.1 F (36.7 C) Oral 61 18 100 %   General appearance: alert, cooperative, appears stated age and no distress Head: Normocephalic, without obvious abnormality, atraumatic Eyes: conjunctivae/corneas clear. PERRL, EOM's intact. Fundi benign. Neck: no carotid bruit, no JVD and supple, symmetrical, trachea midline Lungs: clear to auscultation bilaterally Chest wall: no tenderness Heart: regular rate and rhythm, S1, S2 normal, no murmur, click, rub or gallop Abdomen: soft, non-tender; bowel sounds normal; no masses,  no organomegaly Extremities: extremities normal, atraumatic, no cyanosis or edema Pulses: 2+ and symmetric Neurologic: Grossly normal  Results for orders placed during the hospital encounter of 09/02/13 (from the past 48 hour(s))  POCT I-STAT TROPONIN I     Status: None   Collection Time    09/02/13  5:05 PM      Result Value Range   Troponin i, poc 0.01  0.00 - 0.08 ng/mL   Comment 3            Comment: Due to the release kinetics of cTnI,     a negative result within the first hours     of the onset of symptoms does not rule out     myocardial infarction with certainty.     If myocardial infarction is still suspected,     repeat the test at appropriate intervals.  CBC     Status: Abnormal   Collection Time    09/02/13  5:09 PM      Result Value Range   WBC 5.2  4.0 -  10.5 K/uL   RBC 5.09  3.87 - 5.11 MIL/uL   Hemoglobin 13.7  12.0 - 15.0 g/dL   HCT 42.0  36.0 - 46.0 %   MCV 82.5  78.0 - 100.0 fL   MCH 26.9  26.0 - 34.0 pg   MCHC 32.6  30.0 - 36.0 g/dL   RDW 16.1 (*) 11.5 - 15.5 %  Platelets 236  150 - 400 K/uL  BASIC METABOLIC PANEL     Status: Abnormal   Collection Time    09/02/13  5:09 PM      Result Value Range   Sodium 140  137 - 147 mEq/L   Potassium 4.1  3.7 - 5.3 mEq/L   Chloride 103  96 - 112 mEq/L   CO2 22  19 - 32 mEq/L   Glucose, Bld 94  70 - 99 mg/dL   BUN 7  6 - 23 mg/dL   Creatinine, Ser 0.81  0.50 - 1.10 mg/dL   Calcium 9.1  8.4 - 10.5 mg/dL   GFR calc non Af Amer 78 (*) >90 mL/min   GFR calc Af Amer >90  >90 mL/min   Comment: (NOTE)     The eGFR has been calculated using the CKD EPI equation.     This calculation has not been validated in all clinical situations.     eGFR's persistently <90 mL/min signify possible Chronic Kidney     Disease.  PRO B NATRIURETIC PEPTIDE     Status: Abnormal   Collection Time    09/02/13  5:09 PM      Result Value Range   Pro B Natriuretic peptide (BNP) 222.7 (*) 0 - 125 pg/mL  TROPONIN I     Status: None   Collection Time    09/02/13 11:03 PM      Result Value Range   Troponin I <0.30  <0.30 ng/mL   Comment:            Due to the release kinetics of cTnI,     a negative result within the first hours     of the onset of symptoms does not rule out     myocardial infarction with certainty.     If myocardial infarction is still suspected,     repeat the test at appropriate intervals.   Dg Chest 2 View  09/02/2013   CLINICAL DATA:  Chest pain and shortness of breath  EXAM: CHEST  2 VIEW  COMPARISON:  May 19, 2013  FINDINGS: There is no edema or consolidation. The heart size is normal. The pulmonary vascularity is stable and does reflect a degree of underlying emphysematous change. No adenopathy. No pneumothorax. No bone lesions.  IMPRESSION: Evidence of a degree of underlying  emphysematous change, stable. No edema or consolidation.   Electronically Signed   By: Lowella Grip M.D.   On: 09/02/2013 17:39    ECG:  Sinus brady LAD, no acute ischemic changes.  Since previous tracing, IRBB and PVCs no longer present.  Assessment: 31F with known CAD s/p MI 9/14 presents with chest pain. Chest pain r/o MI CAD s/p PCI HTN HLD SLE  Plan: 1. Cardiology Admission  2. Continuous monitoring on Telemetry. 3. Repeat ekg on admit, prn chest pain or arrythmia 4. Cycle cardiac biomarkers 5. Medical management to include ASA,Brilinta ACEi, Statin, NTG prn 6. NPO @ MN for consideration of further noninvasive ischemic evaluation if enzymes remain negative.

## 2013-09-02 NOTE — ED Notes (Signed)
Attempted report x1. 

## 2013-09-02 NOTE — ED Notes (Signed)
Pt went to dr office for lab work. Reports fatigue today, chest discomfort and fluttering feeling and stabbing pain to her back. They did ekg and sent pt here for further eval. ekg done on arrival, airway intact, no acute distress noted.

## 2013-09-02 NOTE — Progress Notes (Signed)
Pt in for labs. She C/O of being tired and sleepy all day, also C/O of chest pressure and fluttering, neck pain and a feeling like she is being stabbed on her back, and  indigestion. EKG done read per DR. Crenshaw rate 63 beats/ minute. B/P left arm lying 124/80 sitting 116/76 standing 102/74. Dr Jens Som recommended for pt to be send to the ER via emergency service, pt refused ; she stated that she could drive herself because the ER is just across the street. Pt was encouraged to have the ambulance to take her pt refused again. [place]  Irving Burton RN aware. Also Lavella Hammock PA aware.

## 2013-09-02 NOTE — ED Provider Notes (Signed)
CSN: [id number]     Arrival date & time 09/02/13  1639 History   First MD Initiated Contact with Patient 09/02/13 1814     Chief Complaint  Patient presents with  . Chest Pain   Level V caveat for a reluctant historian  (Consider location/radiation/quality/duration/timing/severity/associated sxs/prior Treatment) HPI Patient is very difficult to get a history or the reason why she is here. She is very hostile and does not want to answer questions. She states at first "I don't know why I'm here, I slept all day". She then states she had a 3:30 appointment at her doctor's office to get blood. She states she started feeling dizzy and lightheaded without spinning sensation. She denies headache, nausea, vomiting, blurred vision. She states she did have a burning chest pain that was left-sided. She took one nitroglycerin and the pain went away. She will not try to estimate how long the pain lasted. She states she did have shortness of breath but it's gone now. She denies diaphoresis. She states her last time she had this pain was when she had her heart attacks, one was in 2011, the other was in 2014. She states she has one stent. She states she was at her cardiologist's office when this happened. She states this is the first time she's had chest pain since her MI in 2014. She states she didn't see Dr Jens Som today, "but saw somebody" and was sent to the ED.   PCP Dr Felicity Coyer Cardiology Dr Jens Som  Past Medical History  Diagnosis Date  . 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 PAD (peripheral artery disease)     eval by Dr. [name] in past but patient canceled angiogram  . RA (rheumatoid arthritis)     prior tx by [name]  . GERD (gastroesophageal reflux disease)   . Dizziness     ? CVA 01/2010 - carotid dopplers with no ICA stenosis  . Arnold-Chiari malformation, type I     MRI brain 02/2010  . Bradycardia   . Tobacco abuse      quit 04/2013  . DEPRESSION   . HYPERLIPIDEMIA   . HYPERTENSION   . CAD (coronary artery disease)     NSTEMI 07/2009: LHC - D2 40%, LAD irreg., EF 50% with apical AK (tako-tsubo CM);  Inf STEMI (04/2013): Tx Promus DES to CFX  . SLE (systemic lupus erythematosus) dx 05/2013  . Sjogren's syndrome 06/02/13   Past Surgical History  Procedure Laterality Date  . None     Family History  Problem Relation Age of Onset  . Arthritis Mother   . Arthritis Father   . Diabetes Other    History  Substance Use Topics  . Smoking status: Former Smoker    Types: Cigarettes    Quit date: 05/19/2013  . Smokeless tobacco: Former Neurosurgeon    Quit date: 05/19/2013     Comment:    . Alcohol Use: Yes   States "I don't do any activities" States she quit drinking after her MI in 2014 and is upset that I would ask stating "I don't drink alcohol and take medication".  OB History   Grav Para Term Preterm Abortions TAB SAB Ect Mult Living                 Review of Systems  All other systems reviewed and are negative.    Allergies  Review of patient's allergies indicates no  known allergies.  Home Medications   Current Outpatient Rx  Name  Route  Sig  Dispense  Refill  . aspirin 81 MG tablet   Oral   Take 1 tablet (81 mg total) by mouth daily.   30 tablet      . atorvastatin (LIPITOR) 40 MG tablet      QD   30 tablet   11     PLEASE IF  YOU CAN  TAKE  BACK   THE  80 MG  TABS  ...   . hydroxychloroquine (PLAQUENIL) 200 MG tablet   Oral   Take 1 tablet (200 mg total) by mouth daily.         [name] lisinopril (PRINIVIL,ZESTRIL) 20 MG tablet   Oral   Take 1 tablet (20 mg total) by mouth daily.   90 tablet   3   . nitroGLYCERIN (NITROSTAT) 0.4 MG SL tablet   Sublingual   Place 1 tablet (0.4 mg total) under the tongue every 5 (five) minutes as needed for chest pain.   25 tablet   3   . predniSONE (STERAPRED UNI-PAK) 10 MG tablet   Oral   Take by mouth as directed. As directed x 6  days   21 tablet   0   . Ticagrelor (BRILINTA) 90 MG TABS tablet   Oral   Take 1 tablet (90 mg total) by mouth 2 (two) times daily.   60 tablet   11    BP 177/92  Pulse 61  Temp(Src) 97.9 F (36.6 C) (Oral)  Resp 16  SpO2 100%  Vital signs normal   Physical Exam  Nursing note and vitals reviewed. Constitutional: She is oriented to person, place, and time. She appears well-developed and well-nourished.  Non-toxic appearance. She does not appear ill. No distress.  HENT:  Head: Normocephalic and atraumatic.  Right Ear: External ear normal.  Left Ear: External ear normal.  Nose: Nose normal. No mucosal edema or rhinorrhea.  Mouth/Throat: Oropharynx is clear and moist and mucous membranes are normal. No dental abscesses or uvula swelling.  When I placed the otoscope back on the wall she states "what are you doing?" I informed her I was doing a physical exam on her. She states "I didn't open my mouth  enough". I informed her I saw what I needed to see  Eyes: Conjunctivae and EOM are normal. Pupils are equal, round, and reactive to light.  Neck: Normal range of motion and full passive range of motion without pain. Neck supple.  Cardiovascular: Normal rate, regular rhythm and normal heart sounds.  Exam reveals no gallop and no friction rub.   No murmur heard. Pulmonary/Chest: Effort normal and breath sounds normal. No respiratory distress. She has no wheezes. She has no rhonchi. She has no rales. She exhibits no tenderness and no crepitus.  Abdominal: Soft. Normal appearance and bowel sounds are normal. She exhibits no distension. There is no tenderness. There is no rebound and no guarding.  Musculoskeletal: Normal range of motion. She exhibits no edema and no tenderness.  Moves all extremities well.   Neurological: She is alert and oriented to person, place, and time. She has normal strength. No cranial nerve deficit.  Skin: Skin is warm, dry and intact. No rash noted. No erythema. No  pallor.  Psychiatric: Her speech is normal. Her affect is angry, labile and inappropriate.  Pt is hostile    ED Course  Procedures (including critical care time)  Medications  aspirin chewable  tablet 324 mg (not administered)  nitroGLYCERIN (NITROGLYN) 2 % ointment 1 inch (not administered)   Patient refused medications.  19:11 Dr Delton See, [facility] will see patient.    Labs Review Results for orders placed during the hospital encounter of 09/02/13  CBC      Result Value Range   WBC 5.2  4.0 - 10.5 K/uL   RBC 5.09  3.87 - 5.11 MIL/uL   Hemoglobin 13.7  12.0 - 15.0 g/dL   HCT 64.4  03.4 - 74.2 %   MCV 82.5  78.0 - 100.0 fL   MCH 26.9  26.0 - 34.0 pg   MCHC 32.6  30.0 - 36.0 g/dL   RDW 59.5 (*) 63.8 - 75.6 %   Platelets 236  150 - 400 K/uL  BASIC METABOLIC PANEL      Result Value Range   Sodium 140  137 - 147 mEq/L   Potassium 4.1  3.7 - 5.3 mEq/L   Chloride 103  96 - 112 mEq/L   CO2 22  19 - 32 mEq/L   Glucose, Bld 94  70 - 99 mg/dL   BUN 7  6 - 23 mg/dL   Creatinine, Ser 4.33  0.50 - 1.10 mg/dL   Calcium 9.1  8.4 - 29.5 mg/dL   GFR calc non Af Amer 78 (*) >90 mL/min   GFR calc Af Amer >90  >90 mL/min  PRO B NATRIURETIC PEPTIDE      Result Value Range   Pro B Natriuretic peptide (BNP) 222.7 (*) 0 - 125 pg/mL  POCT I-STAT TROPONIN I      Result Value Range   Troponin i, poc 0.01  0.00 - 0.08 ng/mL   Comment 3            Laboratory interpretation all normal   Imaging Review Dg Chest 2 View  09/02/2013   CLINICAL DATA:  Chest pain and shortness of breath  EXAM: CHEST  2 VIEW  COMPARISON:  May 19, 2013  FINDINGS: There is no edema or consolidation. The heart size is normal. The pulmonary vascularity is stable and does reflect a degree of underlying emphysematous change. No adenopathy. No pneumothorax. No bone lesions.  IMPRESSION: Evidence of a degree of underlying emphysematous change, stable. No edema or consolidation.   Electronically Signed   By:  Bretta Bang M.D.   On: 09/02/2013 17:39    EKG Interpretation    Date/Time:  Tuesday September 02 2013 16:45:35 EST Ventricular Rate:  64 PR Interval:  126 QRS Duration: 104 QT Interval:  460 QTC Calculation: 474 R Axis:   -36 Text Interpretation:  Normal sinus rhythm Left axis deviation Since last tracing T wave inversion no longer evident in Lateral leads Confirmed by Ethin Drummond  MD-I, Kyana Aicher ([id number]) on 09/02/2013 6:15:01 PM            MDM   1. Chest pain     Disposition per Dr Royston Bake, MD, Franz Dell, MD 09/02/13 [phone]

## 2013-09-02 NOTE — ED Notes (Signed)
Pt sts a 10 min ago pt had mid sternal burning pain. sts she took 1 of her nitroglycerins and pain is resolved. Pt denies pain at present time.  Pt sts she has felt fine today went to get routine blood work at [facility]. Pt sts after blood work pt had acute dizziness and was "irritable" and had fluttering in chest, then pins poking feeling in neck and back. Pt sts pain dissipated while sitting in the waiting room.

## 2013-09-02 NOTE — ED Notes (Signed)
This RN explained in detail reasons MD prescribed Aspiring and nitro ointment for patient- purposed of med, function, reactions. Pt refusing. Sts "I already took a nitro and I have lupus." RN explained difference between SL effect and topical. Pt sts she is understands what RN states but does not want to take medication.

## 2013-09-03 ENCOUNTER — Inpatient Hospital Stay (HOSPITAL_COMMUNITY): Payer: Medicare Other

## 2013-09-03 ENCOUNTER — Other Ambulatory Visit: Payer: Self-pay | Admitting: Nurse Practitioner

## 2013-09-03 ENCOUNTER — Encounter (HOSPITAL_COMMUNITY): Payer: Self-pay | Admitting: General Practice

## 2013-09-03 DIAGNOSIS — R7989 Other specified abnormal findings of blood chemistry: Secondary | ICD-10-CM

## 2013-09-03 DIAGNOSIS — R079 Chest pain, unspecified: Secondary | ICD-10-CM

## 2013-09-03 DIAGNOSIS — I200 Unstable angina: Secondary | ICD-10-CM

## 2013-09-03 DIAGNOSIS — E785 Hyperlipidemia, unspecified: Secondary | ICD-10-CM

## 2013-09-03 DIAGNOSIS — R945 Abnormal results of liver function studies: Principal | ICD-10-CM

## 2013-09-03 LAB — CBC
HCT: 37.5 % (ref 36.0–46.0)
Hemoglobin: 12.2 g/dL (ref 12.0–15.0)
MCH: 26.8 pg (ref 26.0–34.0)
MCHC: 32.5 g/dL (ref 30.0–36.0)
MCV: 82.2 fL (ref 78.0–100.0)
Platelets: 198 10*3/uL (ref 150–400)
RBC: 4.56 MIL/uL (ref 3.87–5.11)
RDW: 16 % — AB (ref 11.5–15.5)
WBC: 3 10*3/uL — AB (ref 4.0–10.5)

## 2013-09-03 LAB — BASIC METABOLIC PANEL
BUN: 9 mg/dL (ref 6–23)
CO2: 21 meq/L (ref 19–32)
CREATININE: 0.76 mg/dL (ref 0.50–1.10)
Calcium: 9 mg/dL (ref 8.4–10.5)
Chloride: 105 mEq/L (ref 96–112)
GFR calc non Af Amer: >90 mL/min (ref >90)
Glucose, Bld: 85 mg/dL (ref 70–99)
Potassium: 3.8 mEq/L (ref 3.7–5.3)
Sodium: 139 mEq/L (ref 137–147)

## 2013-09-03 LAB — PROTIME-INR
INR: 0.91 (ref 0.00–1.49)
PROTHROMBIN TIME: 12.1 s (ref 11.6–15.2)

## 2013-09-03 LAB — TROPONIN I: Troponin I: <0.3 ng/mL (ref <0.30)

## 2013-09-03 LAB — MAGNESIUM: MAGNESIUM: 1.8 mg/dL (ref 1.5–2.5)

## 2013-09-03 MED ORDER — ROSUVASTATIN CALCIUM 10 MG PO TABS: 10.0000 mg | ORAL_TABLET | Freq: Every day | ORAL | Status: DC

## 2013-09-03 MED ORDER — REGADENOSON 0.4 MG/5ML IV SOLN
0.4000 mg | Freq: Once | INTRAVENOUS | Status: AC
  Administered 2013-09-03: 0.4 mg via INTRAVENOUS
  Filled 2013-09-03: qty 5

## 2013-09-03 MED ORDER — REGADENOSON 0.4 MG/5ML IV SOLN
INTRAVENOUS | Status: AC
  Administered 2013-09-03: 12:00:00 0.4 mg via INTRAVENOUS
  Filled 2013-09-03: qty 5

## 2013-09-03 MED ORDER — TECHNETIUM TC 99M SESTAMIBI GENERIC - CARDIOLITE
10.0000 | Freq: Once | INTRAVENOUS | Status: AC | PRN
  Administered 2013-09-03: 10 via INTRAVENOUS

## 2013-09-03 MED ORDER — TECHNETIUM TC 99M SESTAMIBI GENERIC - CARDIOLITE
30.0000 | Freq: Once | INTRAVENOUS | Status: AC | PRN
  Administered 2013-09-03: 30 via INTRAVENOUS

## 2013-09-03 NOTE — Progress Notes (Signed)
Went over all d/c instructions and follow up appts with med changes. Scripts e-sent to pharmacy

## 2013-09-03 NOTE — Progress Notes (Signed)
    Subjective:  Denies CP or dyspnea   Objective:  Filed Vitals:   09/02/13 2045 09/02/13 2115 09/02/13 2130 09/03/13 0530  BP: 171/100 142/87 164/89 122/79  Pulse: 54 52 62 61  Temp:   97.4 F (36.3 C) 98.1 F (36.7 C)  TempSrc:   Oral Oral  Resp:   18 18  Height:   5\' 6"  (1.676 m)   Weight:   121 lb (54.885 kg)   SpO2: 100% 99% 100% 100%    Intake/Output from previous day: No intake or output data in the 24 hours ending 09/03/13 0925  Physical Exam: Physical exam: Well-developed well-nourished in no acute distress.  Skin is warm and dry.  HEENT is normal.  Neck is supple.  Chest is clear to auscultation with normal expansion.  Cardiovascular exam is regular rate and rhythm.  Abdominal exam nontender or distended. No masses palpated. Extremities show no edema. neuro grossly intact    Lab Results: Basic Metabolic Panel:  Recent Labs  11/01/13 1709 09/03/13 0525  NA 140 139  K 4.1 3.8  CL 103 105  CO2 22 21  GLUCOSE 94 85  BUN 7 9  CREATININE 0.81 0.76  CALCIUM 9.1 9.0  MG  --  1.8   CBC:  Recent Labs  09/02/13 1709 09/03/13 0525  WBC 5.2 3.0*  HGB 13.7 12.2  HCT 42.0 37.5  MCV 82.5 82.2  PLT 236 198   Cardiac Enzymes:  Recent Labs  09/02/13 2303 09/03/13 0525  TROPONINI <0.30 <0.30     Assessment/Plan:  1 Chest pain-symptoms somewhat atypical and unlike prior infarct; enzymes negative; plan myovue; if neg, patient can be DCed and fu with me in 4 weeks 2 CAD-Continue ASA, brilinta and statin 3 Hypertension - continue present meds. 4 Hyperlipidemia - recent LFTs elevated; will DC lipitor and try crestor at DC (10 mg daily; lipids and liver in four weeks). 5 Tobacco abuse-patient needs to DC 6 PVD - continue present meds.  11/01/13 09/03/2013, 9:25 AM

## 2013-09-03 NOTE — Discharge Summary (Signed)
See progress notes Mercedes [name]  

## 2013-09-03 NOTE — Discharge Instructions (Signed)
***  PLEASE REMEMBER TO BRING ALL OF YOUR MEDICATIONS TO EACH OF YOUR FOLLOW-UP OFFICE VISITS.  

## 2013-09-03 NOTE — Discharge Summary (Signed)
Discharge Summary   Patient ID: Mercedes [name],  MRN: [id number], DOB/AGE: 11/26/1955 58 y.o.  Admit date: 09/02/2013 Discharge date: 09/03/2013  Primary Care Provider: Rene Paci Primary Cardiologist: B. Crenshaw, MD   Discharge Diagnoses Principal Problem:   Unstable angina  **Cardiolite this admission showed no evidence of reversible ischemia.  Active Problems:   CORONARY ATHEROSCLEROSIS NATIVE CORONARY ARTERY  **Status post prior LCX stenting (DES).   History of Takotsubo Cardiomyopathy   TOBACCO ABUSE - ongoing   HYPERTENSION   Abnormal LFTs  **Lipitor 40mg  d/c'd in favor of Crestor 10mg .  **Follow-up LFT's scheduled for 10/01/2013.   HYPERLIPIDEMIA   DEPRESSION   PERIPHERAL VASCULAR DISEASE   GERD   Rheumatoid arthritis(714.0)   SLE (systemic lupus erythematosus)  Allergies No Known Allergies  Procedures  Lexiscan Cardiolite 1.7.2015  Abnormal myovue with distal anterior wall, apical and inferior apical wall infarct, no ischemia.  EF calculated as normal at 58% with wall motion abnormalities as described above.  History of Present Illness  58 year old female with a history of Takotsubo Cardiomyopathy in 2010 and subsequent inferior STEMI in 04/2013 requiring drug-eluting stent placement to the left circumflex.  She also has a history of HTN, hyperlipidemia, ongoing tobacco abuse, lupus, and rheumatoid arthritis. She was in her usual state of health until 1/6, when she developed chest discomfort radiating to her neck and right upper back while getting lab work at the 2011 clinic.  She was referred to the ED for further evaluation and there, her ECG was non-acute, while troponin was normal.  She was observed for further evaluation.  Hospital Course  Patient ruled out for MI.  She had no further chest pain.  She underwent Lexiscan Cardiolite this morning, which revealed distal anterior, apical, and inferoapical infarct without evidence of ischemia.   Overall EF was normal at 58%.  In the setting of prior MI and Takotsubo cardiomyopathy with mid to distal lateral hypokinesis noted on prior echocardiogram in September of 2014, her Cardiolite was felt to be low risk and [name] will be discharged home today in good condition.  She will follow-up with Dr. 03-21-1976 in 3 wks at which time consideration for repeat echocardiography will be given.  Of note, [name] was noted to have elevated LFT's on admission (see labs below).  As a result of this finding, her lipitor was discontinued and she is instead being discharged home on Crestor 10mg  daily.  We have arranged for f/u LFT's on 10/01/2013 (4 wks).  Discharge Vitals Blood pressure 134/69, pulse 61, temperature 98.2 F (36.8 C), temperature source Oral, resp. rate 18, height 5\' 6"  (1.676 m), weight 121 lb (54.885 kg), SpO2 100.00%.  Filed Weights   09/02/13 2130  Weight: 121 lb (54.885 kg)   Labs  CBC  Recent Labs  09/02/13 1709 09/03/13 0525  WBC 5.2 3.0*  HGB 13.7 12.2  HCT 42.0 37.5  MCV 82.5 82.2  PLT 236 198   Basic Metabolic Panel  Recent Labs  09/02/13 1709 09/03/13 0525  NA 140 139  K 4.1 3.8  CL 103 105  CO2 22 21  GLUCOSE 94 85  BUN 7 9  CREATININE 0.81 0.76  CALCIUM 9.1 9.0  MG  --  1.8   Liver Function Tests  Recent Labs  09/02/13 1500  AST 41*  ALT 57*  ALKPHOS 131*  BILITOT 0.2*  PROT 6.9  ALBUMIN 3.6   Cardiac Enzymes  Recent Labs  09/02/13 2303 09/03/13 0525  TROPONINI <0.30 <0.30   Fasting Lipid Panel  Recent Labs  09/02/13 1500  CHOL 107  HDL 47.60  LDLCALC 26  TRIG 167.0*  CHOLHDL 2   Disposition  Pt is being discharged home today in good condition.  Follow-up Plans & Appointments      Follow-up Information   Follow up with Olga Millers, MD On 10/23/2013. (9:30 AM)    Specialty:  Cardiology   Contact information:   [address] [place] [address] [phone]       Follow up with  Rene Paci, MD. (As needed)    Specialty:  Internal Medicine   Contact information:   [address] [place] [address] [phone]       Follow up with [facility] On 10/01/2013. (Lab - liver function tests;  anytime between 8:30 AM and 3:00 PM)    Contact information:   [address] [place] [address] [phone]     Discharge Medications    Medication List    STOP taking these medications       atorvastatin 40 MG tablet  Commonly known as:  LIPITOR      TAKE these medications       aspirin 81 MG tablet  Take 1 tablet (81 mg total) by mouth daily.     lisinopril 20 MG tablet  Commonly known as:  PRINIVIL,ZESTRIL  Take 1 tablet (20 mg total) by mouth daily.     nicotine 21 mg/24hr patch  Commonly known as:  NICODERM CQ - dosed in mg/24 hours  Place 21 mg onto the skin daily.     nitroGLYCERIN 0.4 MG SL tablet  Commonly known as:  NITROSTAT  Place 1 tablet (0.4 mg total) under the tongue every 5 (five) minutes as needed for chest pain.     PLAQUENIL 200 MG tablet  Generic drug:  hydroxychloroquine  Take 1 tablet (200 mg total) by mouth daily.     rosuvastatin 10 MG tablet  Commonly known as:  CRESTOR  Take 1 tablet (10 mg total) by mouth daily.     Ticagrelor 90 MG Tabs tablet  Commonly known as:  BRILINTA  Take 1 tablet (90 mg total) by mouth 2 (two) times daily.      Outstanding Labs/Studies  LFT's in 4 weeks.  Duration of Discharge Encounter   Greater than 30 minutes including physician time.  Signed, Nicolasa Ducking NP 09/03/2013, 10:40 AM

## 2013-09-03 NOTE — Progress Notes (Signed)
Pt is refusing all bedtime medications including IV fluids

## 2013-09-04 ENCOUNTER — Telehealth: Payer: Self-pay | Admitting: Cardiology

## 2013-09-04 NOTE — Telephone Encounter (Signed)
New message     Cannot afford new blood pressure medication.  Pt was taken off lisinopril which was cheap.  Is there something else she can get that is not expensive?

## 2013-09-04 NOTE — Telephone Encounter (Signed)
Spoke with pt, her lipitor was changed to crestor in the hosp. She is unable to afford. Samples and savings card placed at the front desk for her to pick up. Will discuss at follow up appt 09-16-13

## 2013-09-05 ENCOUNTER — Encounter: Payer: Self-pay | Admitting: *Deleted

## 2013-09-05 NOTE — Progress Notes (Signed)
Change lipitor to 20 mg daily; lipids and liver in six weeks. Mercedes [name]  See discharge summary. lipitor changed to crestor.

## 2013-09-16 ENCOUNTER — Encounter: Payer: Self-pay | Admitting: Cardiology

## 2013-09-16 ENCOUNTER — Ambulatory Visit (INDEPENDENT_AMBULATORY_CARE_PROVIDER_SITE_OTHER): Payer: Medicare Other | Admitting: Cardiology

## 2013-09-16 VITALS — BP 120/80 | HR 60 | Ht 66.0 in | Wt 122.1 lb

## 2013-09-16 DIAGNOSIS — E785 Hyperlipidemia, unspecified: Secondary | ICD-10-CM

## 2013-09-16 DIAGNOSIS — I739 Peripheral vascular disease, unspecified: Secondary | ICD-10-CM

## 2013-09-16 DIAGNOSIS — F17200 Nicotine dependence, unspecified, uncomplicated: Secondary | ICD-10-CM

## 2013-09-16 DIAGNOSIS — I10 Essential (primary) hypertension: Secondary | ICD-10-CM

## 2013-09-16 DIAGNOSIS — I2510 Atherosclerotic heart disease of native coronary artery without angina pectoris: Secondary | ICD-10-CM

## 2013-09-16 NOTE — Assessment & Plan Note (Signed)
Continue aspirin, brilinta and statin. 

## 2013-09-16 NOTE — Progress Notes (Signed)
HPI: followup CAD. A cardiac catheterization was performed on August 16, 2009. There was a 40% LAD but no obstructive disease was noted. The ejection fraction was 50% and there was apical ballooning. This was felt consistent with Tako-Tsubo CM. Note the renal arteries were patent. Carotid Dopplers in June of 2011 were negative. MRA in July of 2011 secondary to dizziness was also negative for carotid disease. She has been seen by Dr. [name] for abnormal ABIs and claudication. Peripheral arteriogram was scheduled but was canceled as her mother was ill and never rescheduled. 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 LHC (05/19/13): mLAD 50, pD1 40-50, pCFX 75 (culprit), OM with 2 subbranches with abrupt cessation of flow suggestive of embolic event, and pRCA 50-60, mRCA 50-60; faint collaterals from RCA to dCFX; EF 55%, inferior AK. PCI: Promus premier (3.5x16 mm) DES to pCFX. Echo (05/20/13): EF 55-60%, mid to distal lateral wall HK, grade 1 diastolic dysfunction, trivial MR, trivial TR. No beta blocker was started due to bradycardia. Admitted in January of 2015 with chest pain and ruled out. Nuclear study in January of 2015 showed an ejection fraction of 58%. There was infarct but no ischemia. Note LFTs were mildly elevated with an alkaline phosphatase of 131, SGOT 41 and SGPT 57. Lipitor was discontinued and Crestor 10 mg daily added. Since she was discharged she occasionally has some dyspnea but she has not had recurrent chest pain.   Current Outpatient Prescriptions  Medication Sig Dispense Refill  . aspirin 81 MG tablet Take 1 tablet (81 mg total) by mouth daily.  30 tablet    . hydroxychloroquine (PLAQUENIL) 200 MG tablet Take 1 tablet (200 mg total) by mouth daily.      [name] lisinopril (PRINIVIL,ZESTRIL) 20 MG tablet Take 1 tablet (20 mg total) by mouth daily.  90 tablet  3  . nitroGLYCERIN (NITROSTAT) 0.4 MG SL tablet Place 1 tablet (0.4 mg total) under the tongue every 5  (five) minutes as needed for chest pain.  25 tablet  3  . rosuvastatin (CRESTOR) 10 MG tablet Take 1 tablet (10 mg total) by mouth daily.  30 tablet  6  . Ticagrelor (BRILINTA) 90 MG TABS tablet Take 1 tablet (90 mg total) by mouth 2 (two) times daily.  60 tablet  11   No current facility-administered medications for this visit.     Past Medical History  Diagnosis Date  . 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 PAD (peripheral artery disease)     eval by Dr. [name] in past but patient canceled angiogram  . RA (rheumatoid arthritis)     prior tx by [name]  . GERD (gastroesophageal reflux disease)   . Dizziness     ? CVA 01/2010 - carotid dopplers with no ICA stenosis  . Arnold-Chiari malformation, type I     MRI brain 02/2010  . Bradycardia   . Tobacco abuse     quit 04/2013  . DEPRESSION   . HYPERLIPIDEMIA   . HYPERTENSION   . CAD (coronary artery disease)     a. NSTEMI 07/2009: LHC - D2 40%, LAD irreg., EF 50% with apical AK (tako-tsubo CM);  b. Inf STEMI (04/2013): Tx Promus DES to CFX;  c. 08/2013 Lexi CL: No ischemia, dist ant/ap/inf-ap infarct, EF   . SLE (systemic lupus erythematosus) dx 05/2013  . Sjogren's syndrome 06/02/13  . Abnormal LFTs  a. 08/2013 Lipitor 40 d/c'd and crestor 10 started - f/u lft's scheduled for 10/01/2013.    Past Surgical History  Procedure Laterality Date  . None    . Coronary angioplasty    . Tubal ligation      History   Social History  . Marital Status: Single    Spouse Name: N/A    Number of Children: 3  . Years of Education: N/A   Occupational History  . Not on file.   Social History Main Topics  . Smoking status: Former Smoker    Types: Cigarettes    Quit date: 05/19/2013  . Smokeless tobacco: Former Neurosurgeon    Quit date: 05/19/2013     Comment:    . Alcohol Use: Yes     Comment: i DONT DRINK ANYMORE"  . Drug Use: No  . Sexual Activity: Not on file    Other Topics Concern  . Not on file   Social History Narrative   Lives alone.    ROS: no fevers or chills, productive cough, hemoptysis, dysphasia, odynophagia, melena, hematochezia, dysuria, hematuria, rash, seizure activity, orthopnea, PND, pedal edema, claudication. Remaining systems are negative.  Physical Exam: Well-developed well-nourished in no acute distress.  Skin is warm and dry.  HEENT is normal.  Neck is supple.  Chest is clear to auscultation with normal expansion.  Cardiovascular exam is regular rate and rhythm.  Abdominal exam nontender or distended. No masses palpated. Extremities show no edema. neuro grossly intact

## 2013-09-16 NOTE — Patient Instructions (Signed)
Your physician wants you to follow-up in: 6 MONTHS WITH DR Jens Som You will receive a reminder letter in the mail two months in advance. If you don't receive a letter, please call our office to schedule the follow-up appointment.   Your physician recommends that you return for lab work in: 3 TO 4 WEEKS- DO NOT EAT PRIOR TO LAB WORK    LAB HOURS 7:30 AM TO 5 PM

## 2013-09-16 NOTE — Assessment & Plan Note (Signed)
Patient had elevated liver functions on Lipitor. This was discontinued and Crestor 10 mg daily has been added. Check lipids and liver in 4 weeks.

## 2013-09-16 NOTE — Assessment & Plan Note (Signed)
Blood pressure controlled. Continue present medications. 

## 2013-09-16 NOTE — Assessment & Plan Note (Addendum)
Continue aspirin and statin. Her symptoms are not significantly limiting at this point. I will consider referral to Dr. Kirke Corin in the future if symptoms worsen.

## 2013-09-16 NOTE — Assessment & Plan Note (Signed)
Much improved but she still occasionally smokes a cigarette. I encouraged her to avoid altogether.

## 2013-10-04 ENCOUNTER — Encounter (HOSPITAL_COMMUNITY): Payer: Self-pay | Admitting: Emergency Medicine

## 2013-10-04 ENCOUNTER — Emergency Department (HOSPITAL_COMMUNITY)
Admission: EM | Admit: 2013-10-04 | Discharge: 2013-10-05 | Disposition: A | Discharge status: Home / Self Care | Payer: Medicare Other | Attending: Emergency Medicine | Admitting: Emergency Medicine

## 2013-10-04 ENCOUNTER — Emergency Department (HOSPITAL_COMMUNITY): Payer: Medicare Other

## 2013-10-04 ENCOUNTER — Other Ambulatory Visit: Payer: Self-pay

## 2013-10-04 DIAGNOSIS — I2510 Atherosclerotic heart disease of native coronary artery without angina pectoris: Secondary | ICD-10-CM | POA: Insufficient documentation

## 2013-10-04 DIAGNOSIS — R5381 Other malaise: Secondary | ICD-10-CM | POA: Insufficient documentation

## 2013-10-04 DIAGNOSIS — I10 Essential (primary) hypertension: Secondary | ICD-10-CM | POA: Insufficient documentation

## 2013-10-04 DIAGNOSIS — R5383 Other fatigue: Secondary | ICD-10-CM

## 2013-10-04 DIAGNOSIS — R05 Cough: Secondary | ICD-10-CM | POA: Insufficient documentation

## 2013-10-04 DIAGNOSIS — R059 Cough, unspecified: Secondary | ICD-10-CM | POA: Insufficient documentation

## 2013-10-04 DIAGNOSIS — Z7902 Long term (current) use of antithrombotics/antiplatelets: Secondary | ICD-10-CM | POA: Insufficient documentation

## 2013-10-04 DIAGNOSIS — Z8719 Personal history of other diseases of the digestive system: Secondary | ICD-10-CM | POA: Insufficient documentation

## 2013-10-04 DIAGNOSIS — Z87891 Personal history of nicotine dependence: Secondary | ICD-10-CM | POA: Insufficient documentation

## 2013-10-04 DIAGNOSIS — Z8669 Personal history of other diseases of the nervous system and sense organs: Secondary | ICD-10-CM | POA: Insufficient documentation

## 2013-10-04 DIAGNOSIS — Z8659 Personal history of other mental and behavioral disorders: Secondary | ICD-10-CM | POA: Insufficient documentation

## 2013-10-04 DIAGNOSIS — Z7982 Long term (current) use of aspirin: Secondary | ICD-10-CM | POA: Insufficient documentation

## 2013-10-04 DIAGNOSIS — E785 Hyperlipidemia, unspecified: Secondary | ICD-10-CM | POA: Insufficient documentation

## 2013-10-04 DIAGNOSIS — Z79899 Other long term (current) drug therapy: Secondary | ICD-10-CM | POA: Insufficient documentation

## 2013-10-04 DIAGNOSIS — Z8739 Personal history of other diseases of the musculoskeletal system and connective tissue: Secondary | ICD-10-CM | POA: Insufficient documentation

## 2013-10-04 DIAGNOSIS — R079 Chest pain, unspecified: Secondary | ICD-10-CM

## 2013-10-04 LAB — BASIC METABOLIC PANEL
BUN: 10 mg/dL (ref 6–23)
CHLORIDE: 98 meq/L (ref 96–112)
CO2: 24 mEq/L (ref 19–32)
Calcium: 9.9 mg/dL (ref 8.4–10.5)
Creatinine, Ser: 0.91 mg/dL (ref 0.50–1.10)
GFR calc non Af Amer: 68 mL/min — ABNORMAL LOW (ref >90)
GFR, EST AFRICAN AMERICAN: 78 mL/min — AB (ref >90)
Glucose, Bld: 115 mg/dL — ABNORMAL HIGH (ref 70–99)
POTASSIUM: 4.4 meq/L (ref 3.7–5.3)
Sodium: 136 mEq/L — ABNORMAL LOW (ref 137–147)

## 2013-10-04 LAB — CBC
HCT: 42.8 % (ref 36.0–46.0)
Hemoglobin: 14.4 g/dL (ref 12.0–15.0)
MCH: 27.8 pg (ref 26.0–34.0)
MCHC: 33.6 g/dL (ref 30.0–36.0)
MCV: 82.6 fL (ref 78.0–100.0)
PLATELETS: 270 10*3/uL (ref 150–400)
RBC: 5.18 MIL/uL — ABNORMAL HIGH (ref 3.87–5.11)
RDW: 14.5 % (ref 11.5–15.5)
WBC: 5.8 10*3/uL (ref 4.0–10.5)

## 2013-10-04 LAB — POCT I-STAT TROPONIN I: TROPONIN I, POC: 0.01 ng/mL (ref 0.00–0.08)

## 2013-10-04 MED ORDER — ASPIRIN 81 MG PO CHEW: 324.0000 mg | CHEWABLE_TABLET | Freq: Once | ORAL | Status: DC

## 2013-10-04 MED ORDER — NITROGLYCERIN 0.4 MG SL SUBL: 0.4000 mg | SUBLINGUAL_TABLET | SUBLINGUAL | Status: DC | PRN

## 2013-10-04 MED ORDER — NITROGLYCERIN 2 % TD OINT
0.5000 [in_us] | TOPICAL_OINTMENT | Freq: Once | TRANSDERMAL | Status: DC
  Filled 2013-10-04: qty 1

## 2013-10-04 MED ORDER — MORPHINE SULFATE 4 MG/ML IJ SOLN
4.0000 mg | Freq: Once | INTRAMUSCULAR | Status: DC
  Filled 2013-10-04: qty 1

## 2013-10-04 MED ORDER — ONDANSETRON HCL 4 MG/2ML IJ SOLN
4.0000 mg | Freq: Once | INTRAMUSCULAR | Status: DC
  Filled 2013-10-04: qty 2

## 2013-10-04 NOTE — ED Notes (Signed)
Pt once again refusing pain medication after telling resident she would try some for her pain.  Pt stating "I know it's not my heart causing the pain so I don't want any of that medicine." When this RN informed pt that pain medication was used to treat other forms of pain pt adamant about not taking any medication "I am driving and y'all will discharge me after 3 hours and I don't want to get into an accident."  This RN asked pt what she wanted [place] to do for her in the department to control her pain and pt stated "I just want to know what's wrong so you can give me the right medicine".  Pt moaning and groaning in room in distress due to pain.

## 2013-10-04 NOTE — ED Notes (Signed)
Pt refusing all medications.  Provider made aware.

## 2013-10-04 NOTE — ED Provider Notes (Signed)
CSN: [id number]     Arrival date & time 10/04/13  2026 History   First MD Initiated Contact with Patient 10/04/13 2054     Chief Complaint  Patient presents with  . Chest Pain    HPI: Mercedes [name] is a 58 yo F with history of Takotsubo cardiomyopathy, CAD s/p PCI in 2014, SLE and HTN who presents with chest pain, cough and SOB. Symptoms started two days ago while in bed. Pain is located diffusely along her anterior chest, radiates to her left jaw and back. Pain is described as sharp, intermittent, worse with leaning forward and backward. She took nitro which did not relieve her chest pain. She also endorses mild SOB which has been persistent. Today she started to have a non-productive cough, which exacerbated her chest pain. On arrival pain level is 9/10. Pain does not feel similar to previous MI. She does endorse remote history of DVT, no asymmetric leg swelling, denies orthopnea or PNA. Further no fever, chills, sick contacts or recent travel. She has taken her home ASA as prescribed.    Past Medical History  Diagnosis Date  . 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 PAD (peripheral artery disease)     eval by Dr. [name] in past but patient canceled angiogram  . RA (rheumatoid arthritis)     prior tx by [name]  . GERD (gastroesophageal reflux disease)   . Dizziness     ? CVA 01/2010 - carotid dopplers with no ICA stenosis  . Arnold-Chiari malformation, type I     MRI brain 02/2010  . Bradycardia   . Tobacco abuse     quit 04/2013  . DEPRESSION   . HYPERLIPIDEMIA   . HYPERTENSION   . CAD (coronary artery disease)     a. NSTEMI 07/2009: LHC - D2 40%, LAD irreg., EF 50% with apical AK (tako-tsubo CM);  b. Inf STEMI (04/2013): Tx Promus DES to CFX;  c. 08/2013 Lexi CL: No ischemia, dist ant/ap/inf-ap infarct, EF   . SLE (systemic lupus erythematosus) dx 05/2013  . Sjogren's syndrome 06/02/13  . Abnormal LFTs     a. 08/2013  Lipitor 40 d/c'd and crestor 10 started - f/u lft's scheduled for 10/01/2013.   Past Surgical History  Procedure Laterality Date  . None    . Coronary angioplasty    . Tubal ligation     Family History  Problem Relation Age of Onset  . Arthritis Mother   . Arthritis Father   . Diabetes Other    History  Substance Use Topics  . Smoking status: Former Smoker    Types: Cigarettes    Quit date: 05/19/2013  . Smokeless tobacco: Former Neurosurgeon    Quit date: 05/19/2013     Comment:    . Alcohol Use: Yes     Comment: i DONT DRINK ANYMORE"   OB History   Grav Para Term Preterm Abortions TAB SAB Ect Mult Living                 Review of Systems  Constitutional: Positive for fatigue. Negative for fever, chills and appetite change.  Eyes: Negative for photophobia and visual disturbance.  Respiratory: Positive for cough and shortness of breath.   Cardiovascular: Positive for chest pain. Negative for leg swelling.  Gastrointestinal: Negative for nausea, vomiting, abdominal pain, diarrhea and constipation.  Genitourinary: Negative for dysuria, frequency and decreased urine volume.  Musculoskeletal:  Negative for arthralgias, back pain, gait problem and myalgias.  Skin: Negative for color change and wound.  Neurological: Negative for dizziness, syncope, light-headedness and headaches.  Psychiatric/Behavioral: Negative for confusion and agitation.  All other systems reviewed and are negative.    Allergies  Review of patient's allergies indicates no known allergies.  Home Medications   Current Outpatient Rx  Name  Route  Sig  Dispense  Refill  . aspirin 81 MG tablet   Oral   Take 1 tablet (81 mg total) by mouth daily.   30 tablet      . hydroxychloroquine (PLAQUENIL) 200 MG tablet   Oral   Take 1 tablet (200 mg total) by mouth daily.         [name] lisinopril (PRINIVIL,ZESTRIL) 20 MG tablet   Oral   Take 1 tablet (20 mg total) by mouth daily.   90 tablet   3   . nitroGLYCERIN  (NITROSTAT) 0.4 MG SL tablet   Sublingual   Place 1 tablet (0.4 mg total) under the tongue every 5 (five) minutes as needed for chest pain.   25 tablet   3   . rosuvastatin (CRESTOR) 10 MG tablet   Oral   Take 1 tablet (10 mg total) by mouth daily.   30 tablet   6   . Ticagrelor (BRILINTA) 90 MG TABS tablet   Oral   Take 1 tablet (90 mg total) by mouth 2 (two) times daily.   60 tablet   11    BP 136/74  Pulse 68  Temp(Src) 98.5 F (36.9 C) (Oral)  Resp 15  SpO2 100% Physical Exam  Nursing note and vitals reviewed. Constitutional: She is oriented to person, place, and time. No distress.  Thin, middle age female, laying in bed, appears comfortable.   HENT:  Head: Normocephalic and atraumatic.  Mouth/Throat: Mucous membranes are dry.  Eyes: Conjunctivae and EOM are normal. Pupils are equal, round, and reactive to light.  Neck: Normal range of motion. Neck supple.  Cardiovascular: Normal rate, regular rhythm, normal heart sounds and intact distal pulses.   Pulmonary/Chest: Effort normal and breath sounds normal. No respiratory distress.  No chest wall pain   Abdominal: Soft. Bowel sounds are normal. There is no tenderness. There is no rebound and no guarding.  Musculoskeletal: Normal range of motion. She exhibits no edema and no tenderness.  Neurological: She is alert and oriented to person, place, and time. No cranial nerve deficit. Coordination normal.  Skin: Skin is warm and dry. No rash noted.  Psychiatric: She has a normal mood and affect. Her behavior is normal.    ED Course  Procedures (including critical care time) Labs Review Labs Reviewed  CBC - Abnormal; Notable for the following:    RBC 5.18 (*)    All other components within normal limits  BASIC METABOLIC PANEL - Abnormal; Notable for the following:    Sodium 136 (*)    Glucose, Bld 115 (*)    GFR calc non Af Amer 68 (*)    GFR calc Af Amer 78 (*)    All other components within normal limits   D-DIMER, QUANTITATIVE - Abnormal; Notable for the following:    D-Dimer, Quant 0.63 (*)    All other components within normal limits  POCT I-STAT TROPONIN I   Imaging Review Dg Chest 2 View  10/04/2013   CLINICAL DATA:  Chest pain  EXAM: CHEST  2 VIEW  COMPARISON:  September 02, 2013  FINDINGS: There  is no edema or consolidation. There may be a degree of underlying emphysema. Heart size and pulmonary vascularity are normal. No adenopathy. No bone lesions.  IMPRESSION: No edema or consolidation.   Electronically Signed   By: Bretta Bang M.D.   On: 10/04/2013 21:38   Ct Angio Chest Pe W/cm &/or Wo Cm  10/05/2013   CLINICAL DATA:  58 year old female with chest pain.  EXAM: CT ANGIOGRAPHY CHEST WITH CONTRAST  TECHNIQUE: Multidetector CT imaging of the chest was performed using the standard protocol during bolus administration of intravenous contrast. Multiplanar CT image reconstructions including MIPs were obtained to evaluate the vascular anatomy.  CONTRAST:  OMNIPAQUE IOHEXOL 350 MG/ML SOLN  COMPARISON:  11/30/2003 chest CT 10/04/2013 chest radiograph  FINDINGS: This is a technically satisfactory study.  There is no evidence of pulmonary emboli.  Cardiomegaly and coronary artery calcifications are again noted.  There is no evidence of thoracic aortic aneurysm.  No pleural effusions are present. A small amount of fluid in the superior pericardial recess is identified.  Mild centrilobular and paraseptal emphysema again noted.  Mild bibasilar atelectasis is present.  There is no evidence of airspace disease, consolidation, suspicious nodule/mass or endobronchial/endotracheal lesion.  The visualized upper abdomen is unremarkable.  No acute or suspicious bony abnormalities are identified.  Review of the MIP images confirms the above findings.  IMPRESSION: No evidence of pulmonary emboli or thoracic aortic aneurysm.  Cardiomegaly and coronary artery disease.  Emphysema.   Electronically Signed   By: Laveda Abbe M.D.   On: 10/05/2013 01:47    EKG Interpretation   None       MDM   58 yo F with history of Takotsubo cardiomyopathy, CAD s/p PCI in 2014, SLE and HTN who presents with chest pain, cough and SOB. When I first evaluated the patient she was asymptomatic, appeared well, normal vitals. I was concerned about ACS given her extensive history but history is atypical. ECG did not show any new ischemic changes, no diagnostic STE. She refused ASA, nitro and morphine. Istat troponin is negative. CBC and BMP bother without significant abnormality. Her chest x-ray was without PTX, widened mediastinum, effusion or consolidation. I had low suspicion for PE initially given history and normal vitals. However, on my reassessment she was was clutching her chest, stating her pain was severe and hurts to breath. Added d-dimer which was elevated. CTA obtained which was negative for PE or other acute abnormality. Her pain resolved without intervention. Patient continued to refuse any pain medication. I spoke to cardiology because I did not have an alternate explanation for her chest pain, she has a HEART score of 5. They evaluated the patient in the ED and felt her pain was likely not cardiac in origin. They recommended no further cardiac evaluation at this point. I spoke to the patient following Cardiology evaluation, her pain was much improved. She remained HD stable. Felt she was stable for outpatient management given negative w/u, cardiology does not feel her pain is cardiac, tolerating PO. Pain likely MSK vs GI in nature. Advised close PCP f/u. Strict return precautions were given. The patient was in agreement with plan. She ambulated without difficulty prior to DC.   Reviewed imaging, labs, previous medical records and ECG, utilized in MDM  Discussed case with Dr. Micheline Maze  Clinical Impression 1. Chest pain     Margie Billet, MD 10/05/13 2017

## 2013-10-04 NOTE — ED Notes (Signed)
Pt sts pain feels like indigestion in her chest and "pain" in her back.  Pt sts she did not take nitro at home since it "did not feel like it was my heart".

## 2013-10-04 NOTE — ED Notes (Signed)
Since yesterday chest discomfort (positional). When sitting up, feels the discomfort in the chest, radiating up back and throat; also, radiating down rt. Arm. Been coughing

## 2013-10-05 ENCOUNTER — Emergency Department (HOSPITAL_COMMUNITY): Payer: Medicare Other

## 2013-10-05 ENCOUNTER — Encounter (HOSPITAL_COMMUNITY): Payer: Self-pay | Admitting: Radiology

## 2013-10-05 DIAGNOSIS — I2510 Atherosclerotic heart disease of native coronary artery without angina pectoris: Secondary | ICD-10-CM

## 2013-10-05 DIAGNOSIS — R079 Chest pain, unspecified: Secondary | ICD-10-CM

## 2013-10-05 LAB — D-DIMER, QUANTITATIVE (NOT AT ARMC): D DIMER QUANT: 0.63 ug{FEU}/mL — AB (ref 0.00–0.48)

## 2013-10-05 MED ORDER — ACETAMINOPHEN 325 MG PO TABS
650.0000 mg | ORAL_TABLET | Freq: Once | ORAL | Status: AC
  Administered 2013-10-05: 650 mg via ORAL
  Filled 2013-10-05: qty 2

## 2013-10-05 MED ORDER — IOHEXOL 350 MG/ML SOLN
100.0000 mL | Freq: Once | INTRAVENOUS | Status: AC | PRN
  Administered 2013-10-05: 100 mL via INTRAVENOUS

## 2013-10-05 NOTE — Discharge Instructions (Signed)
You can take Motrin or Tylenol for pain. If you have worsening symptoms return to the ED. Please see your doctor in two days if not better.   Chest Wall Pain Chest wall pain is pain in or around the bones and muscles of your chest. It may take up to 6 weeks to get better. It may take longer if you must stay physically active in your work and activities.  CAUSES  Chest wall pain may happen on its own. However, it may be caused by:  A viral illness like the flu.  Injury.  Coughing.  Exercise.  Arthritis.  Fibromyalgia.  Shingles. HOME CARE INSTRUCTIONS   Avoid overtiring physical activity. Try not to strain or perform activities that cause pain. This includes any activities using your chest or your abdominal and side muscles, especially if heavy weights are used.  Put ice on the sore area.  Put ice in a plastic bag.  Place a towel between your skin and the bag.  Leave the ice on for 15-20 minutes per hour while awake for the first 2 days.  Only take over-the-counter or prescription medicines for pain, discomfort, or fever as directed by your caregiver. SEEK IMMEDIATE MEDICAL CARE IF:   Your pain increases, or you are very uncomfortable.  You have a fever.  Your chest pain becomes worse.  You have new, unexplained symptoms.  You have nausea or vomiting.  You feel sweaty or lightheaded.  You have a cough with phlegm (sputum), or you cough up blood. MAKE SURE YOU:   Understand these instructions.  Will watch your condition.  Will get help right away if you are not doing well or get worse. Document Released: 08/14/2005 Document Revised: 11/06/2011 Document Reviewed: 04/10/2011 [facility] Patient Information 2014 [place], [place].

## 2013-10-05 NOTE — Consult Note (Addendum)
CARDIOLOGY CONSULT NOTE   Patient ID: Mercedes [name] MRN: [id number], DOB/AGE: October 24, 1955   Admit date: 10/04/2013 Date of Consult: 10/05/2013   Primary Physician: Rene Paci, MD Primary Cardiologist: Olga Millers, MD ([place])   Pt. Profile  42F with RA, SLE, Sjogrens, PAD, GERD, HTN, HLD, prior smoking, CAD s/p NSTEMI (dx'd Takotsubo 07/2009) and IMI (04/2013 s/p DES to LCx) who presents with atypical CP x 3 days.    Problem List  Past Medical History  Diagnosis Date  . 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 PAD (peripheral artery disease)     eval by Dr. [name] in past but patient canceled angiogram  . RA (rheumatoid arthritis)     prior tx by [name]  . GERD (gastroesophageal reflux disease)   . Dizziness     ? CVA 01/2010 - carotid dopplers with no ICA stenosis  . Arnold-Chiari malformation, type I     MRI brain 02/2010  . Bradycardia   . Tobacco abuse     quit 04/2013  . DEPRESSION   . HYPERLIPIDEMIA   . HYPERTENSION   . CAD (coronary artery disease)     a. NSTEMI 07/2009: LHC - D2 40%, LAD irreg., EF 50% with apical AK (tako-tsubo CM);  b. Inf STEMI (04/2013): Tx Promus DES to CFX;  c. 08/2013 Lexi CL: No ischemia, dist ant/ap/inf-ap infarct, EF   . SLE (systemic lupus erythematosus) dx 05/2013  . Sjogren's syndrome 06/02/13  . Abnormal LFTs     a. 08/2013 Lipitor 40 d/c'd and crestor 10 started - f/u lft's scheduled for 10/01/2013.    Past Surgical History  Procedure Laterality Date  . None    . Coronary angioplasty    . Tubal ligation       Allergies  No Known Allergies  HPI   Mercedes [name] is a 58 y.o. female who presents for evaluation of chest pain. She has known CAD followed by Dr. Jens Som. She had Cath in 2010 which showed 40% LAD but no obstructive disease was noted. The ejection fraction was 50% and there was apical ballooning. This was felt consistent with Tako-Tsubo CM.  She then suffered an inferior infarct in September of 2014. LHC (05/19/13): mLAD 50, pD1 40-50, pCFX 75 (culprit), OM with 2 subbranches with abrupt cessation of flow suggestive of embolic event, and pRCA 50-60, mRCA 50-60; faint collaterals from RCA to dCFX; EF 55%, inferior AK. PCI: Promus premier (3.5x16 mm) DES to pCFX. Echo (05/20/13): EF 55-60%, mid to distal lateral wall HK, grade 1 diastolic dysfunction, trivial MR, trivial TR. No beta blocker was started due to bradycardia.   She was admitted from 1/6 to 1/7 with chest pain. Patient ruled out for MI. She had no further chest pain. She underwent Lexiscan Cardiolite on 1/7 which revealed distal anterior, apical, and inferoapical infarct without evidence of ischemia. Overall EF was normal at 58%. In the setting of prior MI and Takotsubo cardiomyopathy with mid to distal lateral hypokinesis noted on prior echocardiogram in September of 2014, her Cardiolite was felt to be low risk and Mercedes [name] will be discharged home.  Ms. Tat reports that 3 days ago she developed chest pain that felt like "a strain." She states that she slept on her recliner the night before which is atypical for her. Shortly after waking up she leaned forward and felt abrupt onset of chest pain that has persisted for  the past 3 days. It has prevented her from doing much but she has been able to sleep. She says the pain is worse with lying flat, leaning forward, breathing in, and coughing. She states that she developed a mild dry cough yesterday that was particularly bothersome. She tells me that her symptoms prior to her two previous MIs consisted mainly of indigestion type symptoms and not frank chest pain. She thought she felt some indigestion three days ago but has not since.   Due to concerns about this pain she drove herself to the ED. In the ED she was found to be hemodynamically stable and ECG unchanged. She did not feel that the pain was related to her heart and refused SL  NTG and IV morphine as treatments for her pain. Given pleuritic nature of CP, d-dimer was ordered and was borderline positive. This was followed up by a CT angiogram that did not demonstrate evidence of a PE or another acute cardiopulmonary process. Cardiology was consulted.   Of note, Mercedes [name] reports she missed 3 days of her ticagrelor in early January. She states she was trying to figure out what was making her dyspneic. She felt better off the medication but restarted and has not missed a dose since. She spoke with Dr. Jens Som who she says gave her the option of switching to another agent.   Inpatient Medications  . aspirin  324 mg Oral Once  .  morphine injection  4 mg Intravenous Once  . nitroGLYCERIN  0.5 inch Topical Once  . ondansetron (ZOFRAN) IV  4 mg Intravenous Once    Family History Family History  Problem Relation Age of Onset  . Arthritis Mother   . Arthritis Father   . Diabetes Other      Social History History   Social History  . Marital Status: Single    Spouse Name: N/A    Number of Children: 3  . Years of Education: N/A   Occupational History  . Not on file.   Social History Main Topics  . Smoking status: Former Smoker    Types: Cigarettes    Quit date: 05/19/2013  . Smokeless tobacco: Former Neurosurgeon    Quit date: 05/19/2013     Comment:    . Alcohol Use: Yes     Comment: i DONT DRINK ANYMORE"  . Drug Use: No  . Sexual Activity: Not on file   Other Topics Concern  . Not on file   Social History Narrative   Lives alone.     Review of Systems  General:  No chills, fever, night sweats or weight changes.  Cardiovascular:  See HPI Dermatological: Self reported blistering on hands Respiratory:+ cough Urologic: No hematuria, dysuria Abdominal:   No nausea, vomiting, diarrhea, bright red blood per rectum, melena, or hematemesis Neurologic:  No visual changes, wkns, changes in mental status. All other systems reviewed and are otherwise  negative except as noted above.  Physical Exam  Blood pressure 123/68, pulse 61, temperature 98.5 F (36.9 C), temperature source Oral, resp. rate 17, SpO2 99.00%.  General: Pleasant, NAD Psych: Normal affect. Neuro: Alert and oriented X 3. Moves all extremities spontaneously. HEENT: Normal  Neck: Supple without bruits or JVD. Lungs:  Resp regular and unlabored, CTA. Heart: RRR no s3, s4, or murmurs.  Chest pain is NOT reproducible on exam.  Abdomen: Soft, non-tender, non-distended, BS + x 4.  Extremities: No clubbing, cyanosis or edema. DP/PT/Radials/femoral 2+ and equal bilaterally.  Labs  No  results found for this basename: CKTOTAL, CKMB, TROPONINI,  in the last 72 hours Lab Results  Component Value Date   WBC 5.8 10/04/2013   HGB 14.4 10/04/2013   HCT 42.8 10/04/2013   MCV 82.6 10/04/2013   PLT 270 10/04/2013    Recent Labs Lab 10/04/13 2053  NA 136*  K 4.4  CL 98  CO2 24  BUN 10  CREATININE 0.91  CALCIUM 9.9  GLUCOSE 115*   Lab Results  Component Value Date   CHOL 107 09/02/2013   HDL 47.60 09/02/2013   LDLCALC 26 09/02/2013   TRIG 167.0* 09/02/2013   Lab Results  Component Value Date   DDIMER 0.63* 10/05/2013    Radiology/Studies  Dg Chest 2 View  10/04/2013   CLINICAL DATA:  Chest pain  EXAM: CHEST  2 VIEW  COMPARISON:  September 02, 2013  FINDINGS: There is no edema or consolidation. There may be a degree of underlying emphysema. Heart size and pulmonary vascularity are normal. No adenopathy. No bone lesions.  IMPRESSION: No edema or consolidation.   Electronically Signed   By: Bretta Bang M.D.   On: 10/04/2013 21:38    ECG NSR, LAD. No STTWC c/w acute ischemia.   ASSESSMENT AND PLAN 69F with RA, SLE, Sjogrens, PAD, GERD, HTN, HLD, prior smoking, CAD s/p NSTEMI (dx'd Takotsubo 07/2009) and IMI (04/2013 s/p DES to LCx) who presents with atypical CP x 3 days.    The chest pain is positional, pleuritic, and dissimilar relative to her MI pain in the past. She had a  nuclear study in January without evidence of ischemia (just infarct.)  Although pericarditis could have chest pain that is positional and pleuritic, her presentation is not consistent with this (pericarditis typically is improved with leaning forward), and her ECG and history are otherwise not consistent with this diagnosis. In sum this pain is most likely musculoskeletal in nature.   No further cardiac evaluation is needed at this time.   Signed, Glori Luis, MD 10/05/2013, 1:04 AM

## 2013-10-10 NOTE — ED Provider Notes (Signed)
Medical screening examination/treatment/procedure(s) were conducted as a shared visit with resident-physician practitioner(s) and myself.  I personally evaluated the patient during the encounter.  Pt is a 58 y.o. female with pmhx as above presenting with chest pain, cough SOB.  She has hx of CAD with PCI, SLE, HTN,  Pt found to have heart score of 5, but negative trop, no PE on CTA.  EKG w/o ischemic changes.  Cardiology evaluated pt in ED, and felt her pain was not cardiac.  On PE, VSS pt in NAD.  Cariopulm exam benign.      EKG Interpretation    Date/Time:  Saturday October 04 2013 20:29:55 EST Ventricular Rate:  69 PR Interval:  116 QRS Duration: 92 QT Interval:  392 QTC Calculation: 420 R Axis:   -64 Text Interpretation:  Normal sinus rhythm Left anterior fascicular block Abnormal ECG ED PHYSICIAN INTERPRETATION AVAILABLE IN [facility] Confirmed by TEST, RECORD ([id number]) on 10/06/2013 9:18:14 AM              Shanna Cisco, MD 10/10/13 [id number]

## 2013-10-17 ENCOUNTER — Other Ambulatory Visit: Payer: Medicare Other

## 2013-10-23 ENCOUNTER — Ambulatory Visit: Payer: Medicare Other | Admitting: Cardiology

## 2013-11-07 ENCOUNTER — Encounter: Payer: Self-pay | Admitting: Internal Medicine

## 2013-11-07 ENCOUNTER — Other Ambulatory Visit (INDEPENDENT_AMBULATORY_CARE_PROVIDER_SITE_OTHER): Payer: Medicare Other

## 2013-11-07 ENCOUNTER — Ambulatory Visit (INDEPENDENT_AMBULATORY_CARE_PROVIDER_SITE_OTHER)
Admission: RE | Admit: 2013-11-07 | Discharge: 2013-11-07 | Disposition: A | Discharge status: Home / Self Care | Payer: Medicare Other | Source: Ambulatory Visit | Attending: Internal Medicine | Admitting: Internal Medicine

## 2013-11-07 ENCOUNTER — Ambulatory Visit (INDEPENDENT_AMBULATORY_CARE_PROVIDER_SITE_OTHER): Payer: Medicare Other | Admitting: Internal Medicine

## 2013-11-07 VITALS — BP 138/76 | HR 69 | Temp 97.3°F | Wt 118.8 lb

## 2013-11-07 DIAGNOSIS — R634 Abnormal weight loss: Secondary | ICD-10-CM

## 2013-11-07 DIAGNOSIS — M329 Systemic lupus erythematosus, unspecified: Secondary | ICD-10-CM

## 2013-11-07 DIAGNOSIS — M79609 Pain in unspecified limb: Secondary | ICD-10-CM

## 2013-11-07 DIAGNOSIS — M069 Rheumatoid arthritis, unspecified: Secondary | ICD-10-CM

## 2013-11-07 DIAGNOSIS — M79675 Pain in left toe(s): Secondary | ICD-10-CM

## 2013-11-07 LAB — CBC WITH DIFFERENTIAL/PLATELET
Basophils Absolute: 0 10*3/uL (ref 0.0–0.1)
Basophils Relative: 0.7 % (ref 0.0–3.0)
EOS PCT: 2.9 % (ref 0.0–5.0)
Eosinophils Absolute: 0.1 10*3/uL (ref 0.0–0.7)
HCT: 43.6 % (ref 36.0–46.0)
Hemoglobin: 14.2 g/dL (ref 12.0–15.0)
LYMPHS PCT: 23.3 % (ref 12.0–46.0)
Lymphs Abs: 1.1 10*3/uL (ref 0.7–4.0)
MCHC: 32.5 g/dL (ref 30.0–36.0)
MCV: 80.8 fl (ref 78.0–100.0)
MONOS PCT: 9.1 % (ref 3.0–12.0)
Monocytes Absolute: 0.4 10*3/uL (ref 0.1–1.0)
Neutro Abs: 3.1 10*3/uL (ref 1.4–7.7)
Neutrophils Relative %: 64 % (ref 43.0–77.0)
PLATELETS: 384 10*3/uL (ref 150.0–400.0)
RBC: 5.4 Mil/uL — ABNORMAL HIGH (ref 3.87–5.11)
RDW: 14.4 % (ref 11.5–14.6)
WBC: 4.9 10*3/uL (ref 4.5–10.5)

## 2013-11-07 LAB — TSH: TSH: 0.38 u[IU]/mL (ref 0.35–5.50)

## 2013-11-07 LAB — HEPATIC FUNCTION PANEL
ALT: 25 U/L (ref 0–35)
AST: 26 U/L (ref 0–37)
Albumin: 3.9 g/dL (ref 3.5–5.2)
Alkaline Phosphatase: 93 U/L (ref 39–117)
Bilirubin, Direct: 0.1 mg/dL (ref 0.0–0.3)
Total Bilirubin: 0.7 mg/dL (ref 0.3–1.2)
Total Protein: 7.7 g/dL (ref 6.0–8.3)

## 2013-11-07 LAB — BASIC METABOLIC PANEL
BUN: 10 mg/dL (ref 6–23)
CALCIUM: 9.6 mg/dL (ref 8.4–10.5)
CHLORIDE: 105 meq/L (ref 96–112)
CO2: 26 meq/L (ref 19–32)
Creatinine, Ser: 0.8 mg/dL (ref 0.4–1.2)
GFR: 91.52 mL/min (ref >60.00)
Glucose, Bld: 112 mg/dL — ABNORMAL HIGH (ref 70–99)
Potassium: 3.9 mEq/L (ref 3.5–5.1)
Sodium: 137 mEq/L (ref 135–145)

## 2013-11-07 MED ORDER — TRAMADOL HCL 50 MG PO TABS: 50.0000 mg | ORAL_TABLET | Freq: Three times a day (TID) | ORAL | Status: DC | PRN

## 2013-11-07 NOTE — Progress Notes (Signed)
Pre visit review using our clinic review tool, if applicable. No additional management support is needed unless otherwise documented below in the visit note. 

## 2013-11-07 NOTE — Patient Instructions (Signed)
Your next office appointment will be determined based upon review of your pending labs & x-rays. Those instructions will be transmitted to you through My Chart  OR  by mail;whichever process is your choice to receive results & recommendations . Unfortunately we cannot send results through My Chart and mail results as well.Mailing requires staff involvement in copying & mailing data.With My Chart there is direct physician to patient communication in "real time". With My Chart you should  receive results very quickly, usually in less than 24 hours if you sign up as soon as possible after your visit.This is especially important with acute heath problems associated with significant lab abnormalities. Additionally using this system allows you access to your chart essentially anywhere in the world where there is Internet access. If you will not be using My Chart; that portal will be closed to prevent lab results not being addressed in a timely fashion by BOTH OF [place] , potentially resulting in delay in care. Followup as needed for your acute issue. Please report any significant change in your symptoms.Share results with all non [facility] medical staff seen

## 2013-11-07 NOTE — Progress Notes (Signed)
Subjective:    Patient ID: Mercedes [name], female    DOB: 12/25/1955, 58 y.o.   MRN: [id number]  HPI  She describes diffuse joint pain present for four months. She was originally seen by a dermatologist in [facility] for diffuse rash associated with blisters on her hands. When seen 05/12/13 rash had been present for one month. The rash was pruritic in nature. Symptoms were bad enough that she had gone to the emergency room on two occasions. That office note was reviewed. Unfortunately it is handwritten and difficult to decipher. The note states that ANA, CBC, Chem 18 were ordered. She was placed on 30 mg prednisone four days followed by 20 mg four days then 10 mg until completed. She was also given an antihistamine for itching. I could not find the results of those test in the chart She followed up with Dr. Dareen Piano, Rheumatologist 07/21/13. His diagnosis includes systemic lupus erythematosus with skin involvement. He also prescribed steroid taper and placed her on antimalarial therapy. Apparently she has also been diagnosed as having Gout based on clinical presentation. Uric acid was 4.9 on 08/08/13.. The problem list includes a diagnosis of rheumatoid arthritis. I can find no clarification of this diagnosis. At this time her main issue is pain in the PIP joint joint of the left great toe. This is intense up to a level X intermittently. It is affecting her sleep and appetite. The symptoms are worse with ambulation and more noticeable at night when she is trying to sleep.   Review of Systems She has seen discharge from the nails of the left hand and finger lesions which appears to be puslike. She has lost 8 pounds since December in the context of anorexia.    Objective:   Physical Exam   Gen.: Thin but adequately nourished in appearance. Alert, appropriate and cooperative throughout exam.  Head: Normocephalic without obvious abnormalities Eyes: No corneal or conjunctival inflammation  noted.No icterus Nose: External nasal exam reveals no deformity or inflammation. Nasal mucosa are dry. No lesions or exudates noted.   Mouth: Oral mucosa and oropharynx reveal no lesions or exudates.  Neck: No deformities, masses, or tenderness noted. Range of motion good. Thyroid normal. Lungs: Normal respiratory effort; chest expands symmetrically. Lungs are clear to auscultation without rales, wheezes, or increased work of breathing. Heart: Normal rate and rhythm. Normal S1 and S2. No gallop, click, or rub. No murmur. Abdomen: Bowel sounds normal; abdomen soft and nontender. No masses, organomegaly or hernias noted.                  Musculoskeletal/extremities: No deformity or scoliosis noted of  the thoracic or lumbar spine. No clubbing or edema.Hands and feet are cool. Violaceous changes are noted of the left great toe and over several fingers in an irregular distribution. Changes are more marked in the left upper extremity rather than the right. Left great toe is exquisitely tender to palpation distal to the PIP joint Able to lie down & sit up w/o help. Negative SLR bilaterally Vascular: Carotid & radial artery pulses are  equal. Decreased dorsalis pedis and  posterior tibials.No bruits present. Neurologic: Alert and oriented x3. Deep tendon reflexes symmetrical and normal.  Gait guarded due to toe pain. Skin: Intact without suspicious lesions or rashes other than noted above. Lymph: No cervical, axillary lymphadenopathy present. Psych: Mood and affect are normal. Normally interactive  Assessment & Plan:  low. Films will be completed to assess for Gouty changes. Obviously the only definitive test would be a diagnostic tap of this joint for uric acid crystals. That would be deferred to Dr. Dareen Piano #2 systemic lupus erythematosus, again as per Dr. Dareen Piano #3 weight loss #4 diagnosis of rheumatoid arthritis  on Problem List; this needs clarification  Plan: see orders. Most important is for her to see Dr. Dareen Piano as soon as possible.

## 2013-11-08 ENCOUNTER — Encounter: Payer: Self-pay | Admitting: Internal Medicine

## 2013-11-08 ENCOUNTER — Telehealth: Payer: Self-pay | Admitting: *Deleted

## 2013-11-08 NOTE — Telephone Encounter (Signed)
Faxed add-on slip...Mercedes [name]

## 2013-11-08 NOTE — Telephone Encounter (Signed)
Message copied by Deatra James on Sat Nov 08, 2013 10:55 AM ------      Message from: Pecola Lawless      Created: Sat Nov 08, 2013  8:34 AM       Please FAX office visit & lab results to Dr Azzie Roup. Thanks, Hopp ------

## 2013-11-08 NOTE — Telephone Encounter (Signed)
Message copied by Deatra James on Sat Nov 08, 2013 10:12 AM ------      Message from: Pecola Lawless      Created: Sat Nov 08, 2013  8:29 AM       Please add A1c (790.29). Thanks, Hopp       ------

## 2013-11-08 NOTE — Telephone Encounter (Signed)
Faxed info to dr. Dareen Piano @ [phone]...Raechel Chute

## 2013-11-10 ENCOUNTER — Ambulatory Visit: Payer: Medicare Other

## 2013-11-10 ENCOUNTER — Encounter: Payer: Self-pay | Admitting: *Deleted

## 2013-11-10 ENCOUNTER — Telehealth: Payer: Self-pay | Admitting: *Deleted

## 2013-11-10 DIAGNOSIS — R7309 Other abnormal glucose: Secondary | ICD-10-CM

## 2013-11-10 LAB — HEMOGLOBIN A1C: Hgb A1c MFr Bld: 6.8 % — ABNORMAL HIGH (ref 4.6–6.5)

## 2013-11-10 NOTE — Telephone Encounter (Signed)
Dr. Dareen Piano needs to see her to evaluate the toe. The only way to make a definitive diagnosis would be to draw fluid from the affected joint looking for uric acid (gout) crystals.  Dr. Dareen Piano also need to clarify whether rheumatoid arthritis is present. The screening rheumatoid arthritis test was very high last year; I found this in the old records.

## 2013-11-10 NOTE — Telephone Encounter (Signed)
Pt called requesting lab results, read results to pt.  Pt is now requesting a return phone call from MD due to not understanding the results.  She wants a diagnosis of her toe swelling.

## 2013-11-11 NOTE — Telephone Encounter (Signed)
Spoke with pt advised of MDs message.  Scheduled pt with Dr Katrinka Blazing on 3.18.15 at 4:15pm

## 2013-11-12 ENCOUNTER — Encounter: Payer: Self-pay | Admitting: Family Medicine

## 2013-11-12 ENCOUNTER — Ambulatory Visit (INDEPENDENT_AMBULATORY_CARE_PROVIDER_SITE_OTHER): Payer: Medicare Other | Admitting: Family Medicine

## 2013-11-12 ENCOUNTER — Other Ambulatory Visit (INDEPENDENT_AMBULATORY_CARE_PROVIDER_SITE_OTHER): Payer: Medicare Other

## 2013-11-12 VITALS — BP 150/90 | HR 67 | Wt 117.0 lb

## 2013-11-12 DIAGNOSIS — M79672 Pain in left foot: Secondary | ICD-10-CM

## 2013-11-12 DIAGNOSIS — M79609 Pain in unspecified limb: Secondary | ICD-10-CM

## 2013-11-12 DIAGNOSIS — L600 Ingrowing nail: Secondary | ICD-10-CM

## 2013-11-12 MED ORDER — CEPHALEXIN 500 MG PO CAPS: 500.0000 mg | ORAL_CAPSULE | Freq: Two times a day (BID) | ORAL | Status: DC

## 2013-11-12 MED ORDER — FLUCONAZOLE 150 MG PO TABS: 150.0000 mg | ORAL_TABLET | Freq: Every day | ORAL | Status: DC

## 2013-11-12 MED ORDER — NAPROXEN 500 MG PO TABS
500.0000 mg | ORAL_TABLET | Freq: Two times a day (BID) | ORAL | Status: DC
Start: 2013-11-12 — End: 2013-11-21

## 2013-11-12 NOTE — Progress Notes (Signed)
  [name] [address] [place], [place] [address] Phone: [phone] Subjective:     CC:   Mercedes [name] is a 58 y.o. female coming in with complaint of left big toe pain. Patient has a past medical history significant for coronary artery disease, lupus, rheumatoid arthritis coming in with left great toe pain. Patient is seen in urgent care, primary care, as well as rheumatology. Patient has been diagnosed with multiple problems including gout for this problem. Patient states she notices pain approximately 4 months ago after having a pedicure. Patient states that there is some mild swelling and now it even hurts for him in-flight sensation. Patient states the last 3 weeks she has not even had any good sleep secondary to the amount of pain. Patient describes it as a throbbing sensation he can become extremely 10 out of 10 pain even with light touch. Patient is finding it difficult to ambulate. Patient denies any fevers or chills. Patient has been on prednisone with no improvement.     Past medical history, social, surgical and family history all reviewed in electronic medical record.   Review of Systems: No headache, visual changes, nausea, vomiting, diarrhea, constipation, dizziness, abdominal pain, skin rash, fevers, chills, night sweats, weight loss, swollen lymph nodes, body aches, joint swelling, muscle aches, chest pain, shortness of breath, mood changes.   Objective Blood pressure 150/90, pulse 67, weight 117 lb (53.071 kg), SpO2 92.00%.  General: No apparent distress alert and oriented x3 mood and affect normal, dressed appropriately. Thin HEENT: Pupils equal, extraocular movements intact  Respiratory: Patient's speak in full sentences and does not appear short of breath  Cardiovascular: No lower extremity edema, non tender, no erythema  Skin: Warm dry intact with no signs of infection or rash on extremities or on axial skeleton.    Abdomen: Soft nontender  Neuro: Cranial nerves II through XII are intact, neurovascularly intact in all extremities with 2+ DTRs and 2+ pulses.  Lymph: No lymphadenopathy of posterior or anterior cervical chain or axillae bilaterally.  Gait normal with good balance and coordination.  MSK:  Non tender with full range of motion and good stability and symmetric strength and tone of shoulders, elbows, wrist, hip, knee and ankles bilaterally. Patient does have rheumatological changes of multiple joints Foot exam shows the patient does have tibial deviation of the toes bilaterally. Patient's left rate toe does have significant hypoechoic change of the skin and does have tightness on the most distal aspect of the toe. This is extremely tender to palpation. Patient does have onychomycosis and multiple toenails. Severely tender to palpation but neurovascularly intact distally  Procedure note After verbal consent patient was anesthetized with 1 cc of 0.5% Marcaine. Patient was unable to tolerate anymore and anesthesia. At that time small incision was made at the tip of the toe and patient did have significant amount of pus removed as well as blood. I was able to lift the medial aspect of patient's large toe nail in significant amount of pus was expressed with pressure as well. Once again patient was unable to allow me to him probe any further to see if there is any retained toenail that would need to be removed. Patient was put in a pressure dressing. Post incision and drainage instructions given.     Impression and Recommendations:     This case required medical decision making of moderate complexity.

## 2013-11-12 NOTE — Assessment & Plan Note (Signed)
Patient does have ingrown toenail and will need it removed but unable to tolerate the pain today.   Was able to make small incision and did get a lot of pus out and allowed it to continue to drain Started abx per orders, meloxicam short course for inflammation  Come back again in 5 days and will remove toenail Patient will take pain medicine before office visit.

## 2013-11-12 NOTE — Patient Instructions (Signed)
Good to meet you Naproxen twice daily for 5 days for the pain Two antibiotics for next 10 days Come back on Monday at 400 and we will check it again

## 2013-11-17 ENCOUNTER — Ambulatory Visit (INDEPENDENT_AMBULATORY_CARE_PROVIDER_SITE_OTHER): Payer: Medicare Other | Admitting: Family Medicine

## 2013-11-17 ENCOUNTER — Encounter: Payer: Self-pay | Admitting: Family Medicine

## 2013-11-17 VITALS — BP 152/88 | HR 74

## 2013-11-17 DIAGNOSIS — L600 Ingrowing nail: Secondary | ICD-10-CM

## 2013-11-17 NOTE — Progress Notes (Signed)
  [name] [address] [place], [place] [address] Phone: [phone] Subjective:     CC: Left big toe pain followup  FSE:[id number] Mercedes [name] is a 58 y.o. female coming in with complaint of left big toe pain. Patient was seen previously and did have a very large ingrown toe with significant abscess formation. Patient did ha some incision and drainage but secondary to pain we had to stop the procedure prematurely. Patient has been on antibiotics for the last 4 days and states that she has had no fevers or chills and has a window some mild discharge from the toe. Patient states it is feeling significantly better but still has difficulty with ambulation. Denies any new symptoms.    Past medical history, social, surgical and family history all reviewed in electronic medical record.   Review of Systems: No headache, visual changes, nausea, vomiting, diarrhea, constipation, dizziness, abdominal pain, skin rash, fevers, chills, night sweats, weight loss, swollen lymph nodes, body aches, joint swelling, muscle aches, chest pain, shortness of breath, mood changes.   Objective Blood pressure 152/88, pulse 74, SpO2 98.00%.  General: No apparent distress alert and oriented x3 mood and affect normal, dressed appropriately. Thin HEENT: Pupils equal, extraocular movements intact  Respiratory: Patient's speak in full sentences and does not appear short of breath  Cardiovascular: No lower extremity edema, non tender, no erythema  Skin: Warm dry intact with no signs of infection or rash on extremities or on axial skeleton.  Abdomen: Soft nontender  Neuro: Cranial nerves II through XII are intact, neurovascularly intact in all extremities with 2+ DTRs and 2+ pulses.  Lymph: No lymphadenopathy of posterior or anterior cervical chain or axillae bilaterally.  Gait normal with good balance and coordination.  MSK:  Non tender with full range of motion and good stability  and symmetric strength and tone of shoulders, elbows, wrist, hip, knee and ankles bilaterally. Patient does have rheumatological changes of multiple joints Foot exam shows the patient does have tibial deviation of the toes bilaterally. patient's large left toe still has some mild swelling mostly over the lateral aspect of the toe.  Patient does have some weeping in with some mild pus with compression.neurovascularly intact distally.   Procedure note After verbal consent patient was anesthetized with 1 cc of 0.5% Marcaine this time we did a digital block . a nail elevator was used I was able to lift the medial aspect of patient's large toe nail in significant amount of pus was expressed with pressure as well.  no bleeding occurred. We are able to do pain lateral nail excision and removed ingrown portion of nail. Patient did have a large cavity still noted after everything was removed. This was then packed with idoform. Patient then had a pressure dressing placed. Post incision and drainage instructions given..     Impression and Recommendations:     This case required medical decision making of moderate complexity.

## 2013-11-17 NOTE — Assessment & Plan Note (Signed)
Patient did have lateral muscle of the nailbed today. We did remove the ingrown toenail completely today. Patient did have packing material placed will come back again in 48 hours for further evaluation and to remove the packing material.

## 2013-11-20 ENCOUNTER — Ambulatory Visit (INDEPENDENT_AMBULATORY_CARE_PROVIDER_SITE_OTHER): Payer: Self-pay | Admitting: Family Medicine

## 2013-11-20 ENCOUNTER — Encounter: Payer: Self-pay | Admitting: Family Medicine

## 2013-11-20 DIAGNOSIS — L600 Ingrowing nail: Secondary | ICD-10-CM

## 2013-11-20 NOTE — Progress Notes (Signed)
  [name] [address] [place], [place] [address] Phone: [phone] Subjective:     CC: Left big toe pain followup  NVB:[id number] Mercedes [name] is a 58 y.o. female coming in with complaint of left big toe pain. Patient was seen previously and had an ingrown toenail that we did do a full lateral incision of the toenail. Patient did have packing material placed is a large cavity was noted from the abscess. Patient's is now finished the antibiotics was a ten-day course. Patient is here for recheck. Patient states the pain has improved but she still having no feeling at the tip of the toe. Patient is still finding it difficult to ambulate more for soreness. Patient did wake up this morning at 4 AM with increasing pain again. Patient denies any discharge from the area and did not unwrap it. Patient denies any fevers or chills and still has 3 days' worth of antibiotics.    Past medical history, social, surgical and family history all reviewed in electronic medical record.   Review of Systems: No headache, visual changes, nausea, vomiting, diarrhea, constipation, dizziness, abdominal pain, skin rash, fevers, chills, night sweats, weight loss, swollen lymph nodes, body aches, joint swelling, muscle aches, chest pain, shortness of breath, mood changes.   Objective There were no vitals taken for this visit.  General: No apparent distress alert and oriented x3 mood and affect normal, dressed appropriately. Thin HEENT: Pupils equal, extraocular movements intact  Respiratory: Patient's speak in full sentences and does not appear short of breath  Cardiovascular: No lower extremity edema, non tender, no erythema  Skin: Warm dry intact with no signs of infection or rash on extremities or on axial skeleton.  Abdomen: Soft nontender  Neuro: Cranial nerves II through XII are intact, neurovascularly intact in all extremities with 2+ DTRs and 2+ pulses.  Lymph: No  lymphadenopathy of posterior or anterior cervical chain or axillae bilaterally.  Gait normal with good balance and coordination.  MSK:  Non tender with full range of motion and good stability and symmetric strength and tone of shoulders, elbows, wrist, hip, knee and ankles bilaterally. Patient does have rheumatological changes of multiple joints Foot exam shows the patient does have tibial deviation of the toes bilaterally.  Left foot was unwrapped the patient does have some mild bruising in the area. Patient packing was removed and patient's cavity was probed but no bone was felt. Patient does have good tissue seen good blood flow. Patient is neurovascularly intact proximal to this area but is still having no feeling distally. Patient does have what appears to be a very small area of necrotic tissue at the very tip of the large toe.     Impression and Recommendations:     This case required medical decision making of moderate complexity.

## 2013-11-20 NOTE — Assessment & Plan Note (Signed)
Patient is not out of the woods at this time. Patient does have unfortunately a little area of necrotic tissue at the very tip of the toe. Patient though is bleeding within the cavity which makes me think that there is good tissue and likely will heal. Patient will continue the antibiotics we will see her again in 3 days for further followup. The patient continues to have any findings that seems to be worsening necrosis we may consider imaging including x-rays and possibly an MRI to rule out osteomyelitis. Otherwise patient shows improvement at next visit we'll continue with conservative therapy.  Spent greater than 25 minutes with patient face-to-face and had greater than 50% of counseling including as described above in assessment and plan.

## 2013-11-20 NOTE — Patient Instructions (Signed)
It is good to see you I am glad you are feeling better.  Change dressing at least daily 1:1 mixture of hydrogen peroxide and water every other day.  Triple antibiotic on the area with each change.  Do not submerge foot.  Come back on Monday.

## 2013-11-21 ENCOUNTER — Emergency Department (HOSPITAL_COMMUNITY): Payer: Medicare Other

## 2013-11-21 ENCOUNTER — Telehealth: Payer: Self-pay

## 2013-11-21 ENCOUNTER — Inpatient Hospital Stay (HOSPITAL_COMMUNITY)
Admission: EM | Admit: 2013-11-21 | Discharge: 2013-11-26 | DRG: 300 | Disposition: A | Discharge status: Home / Self Care | Payer: Medicare Other | Attending: Internal Medicine | Admitting: Internal Medicine

## 2013-11-21 ENCOUNTER — Encounter (HOSPITAL_COMMUNITY): Payer: Self-pay | Admitting: Emergency Medicine

## 2013-11-21 DIAGNOSIS — I10 Essential (primary) hypertension: Secondary | ICD-10-CM | POA: Diagnosis present

## 2013-11-21 DIAGNOSIS — F3289 Other specified depressive episodes: Secondary | ICD-10-CM

## 2013-11-21 DIAGNOSIS — F329 Major depressive disorder, single episode, unspecified: Secondary | ICD-10-CM

## 2013-11-21 DIAGNOSIS — M9080 Osteopathy in diseases classified elsewhere, unspecified site: Secondary | ICD-10-CM | POA: Diagnosis present

## 2013-11-21 DIAGNOSIS — I2510 Atherosclerotic heart disease of native coronary artery without angina pectoris: Secondary | ICD-10-CM | POA: Diagnosis present

## 2013-11-21 DIAGNOSIS — I739 Peripheral vascular disease, unspecified: Secondary | ICD-10-CM

## 2013-11-21 DIAGNOSIS — K219 Gastro-esophageal reflux disease without esophagitis: Secondary | ICD-10-CM

## 2013-11-21 DIAGNOSIS — R0609 Other forms of dyspnea: Secondary | ICD-10-CM | POA: Diagnosis not present

## 2013-11-21 DIAGNOSIS — I498 Other specified cardiac arrhythmias: Secondary | ICD-10-CM

## 2013-11-21 DIAGNOSIS — F411 Generalized anxiety disorder: Secondary | ICD-10-CM | POA: Diagnosis not present

## 2013-11-21 DIAGNOSIS — Z9861 Coronary angioplasty status: Secondary | ICD-10-CM

## 2013-11-21 DIAGNOSIS — I5181 Takotsubo syndrome: Secondary | ICD-10-CM | POA: Diagnosis present

## 2013-11-21 DIAGNOSIS — F17200 Nicotine dependence, unspecified, uncomplicated: Secondary | ICD-10-CM

## 2013-11-21 DIAGNOSIS — R634 Abnormal weight loss: Secondary | ICD-10-CM

## 2013-11-21 DIAGNOSIS — L600 Ingrowing nail: Secondary | ICD-10-CM

## 2013-11-21 DIAGNOSIS — M069 Rheumatoid arthritis, unspecified: Secondary | ICD-10-CM

## 2013-11-21 DIAGNOSIS — R0989 Other specified symptoms and signs involving the circulatory and respiratory systems: Secondary | ICD-10-CM | POA: Diagnosis not present

## 2013-11-21 DIAGNOSIS — I200 Unstable angina: Secondary | ICD-10-CM

## 2013-11-21 DIAGNOSIS — R079 Chest pain, unspecified: Secondary | ICD-10-CM

## 2013-11-21 DIAGNOSIS — L02619 Cutaneous abscess of unspecified foot: Secondary | ICD-10-CM | POA: Diagnosis present

## 2013-11-21 DIAGNOSIS — I429 Cardiomyopathy, unspecified: Secondary | ICD-10-CM

## 2013-11-21 DIAGNOSIS — I152 Hypertension secondary to endocrine disorders: Secondary | ICD-10-CM | POA: Diagnosis present

## 2013-11-21 DIAGNOSIS — R7989 Other specified abnormal findings of blood chemistry: Secondary | ICD-10-CM

## 2013-11-21 DIAGNOSIS — T4595XA Adverse effect of unspecified primarily systemic and hematological agent, initial encounter: Secondary | ICD-10-CM | POA: Diagnosis not present

## 2013-11-21 DIAGNOSIS — I25810 Atherosclerosis of coronary artery bypass graft(s) without angina pectoris: Secondary | ICD-10-CM

## 2013-11-21 DIAGNOSIS — I96 Gangrene, not elsewhere classified: Secondary | ICD-10-CM

## 2013-11-21 DIAGNOSIS — M869 Osteomyelitis, unspecified: Secondary | ICD-10-CM

## 2013-11-21 DIAGNOSIS — M79675 Pain in left toe(s): Secondary | ICD-10-CM

## 2013-11-21 DIAGNOSIS — I252 Old myocardial infarction: Secondary | ICD-10-CM

## 2013-11-21 DIAGNOSIS — Z79899 Other long term (current) drug therapy: Secondary | ICD-10-CM

## 2013-11-21 DIAGNOSIS — M329 Systemic lupus erythematosus, unspecified: Secondary | ICD-10-CM | POA: Diagnosis present

## 2013-11-21 DIAGNOSIS — Z7982 Long term (current) use of aspirin: Secondary | ICD-10-CM

## 2013-11-21 DIAGNOSIS — Q054 Unspecified spina bifida with hydrocephalus: Secondary | ICD-10-CM

## 2013-11-21 DIAGNOSIS — R945 Abnormal results of liver function studies: Secondary | ICD-10-CM

## 2013-11-21 DIAGNOSIS — M3500 Sicca syndrome, unspecified: Secondary | ICD-10-CM | POA: Diagnosis present

## 2013-11-21 DIAGNOSIS — L0390 Cellulitis, unspecified: Secondary | ICD-10-CM

## 2013-11-21 DIAGNOSIS — Z87891 Personal history of nicotine dependence: Secondary | ICD-10-CM

## 2013-11-21 DIAGNOSIS — I428 Other cardiomyopathies: Secondary | ICD-10-CM | POA: Diagnosis present

## 2013-11-21 DIAGNOSIS — L03039 Cellulitis of unspecified toe: Secondary | ICD-10-CM | POA: Diagnosis present

## 2013-11-21 DIAGNOSIS — I2119 ST elevation (STEMI) myocardial infarction involving other coronary artery of inferior wall: Secondary | ICD-10-CM

## 2013-11-21 DIAGNOSIS — E1169 Type 2 diabetes mellitus with other specified complication: Secondary | ICD-10-CM | POA: Diagnosis present

## 2013-11-21 DIAGNOSIS — I70269 Atherosclerosis of native arteries of extremities with gangrene, unspecified extremity: Principal | ICD-10-CM | POA: Diagnosis present

## 2013-11-21 DIAGNOSIS — E785 Hyperlipidemia, unspecified: Secondary | ICD-10-CM | POA: Diagnosis present

## 2013-11-21 LAB — COMPREHENSIVE METABOLIC PANEL
ALK PHOS: 105 U/L (ref 39–117)
ALT: 36 U/L — ABNORMAL HIGH (ref 0–35)
AST: 32 U/L (ref 0–37)
Albumin: 3.7 g/dL (ref 3.5–5.2)
BILIRUBIN TOTAL: 0.3 mg/dL (ref 0.3–1.2)
BUN: 10 mg/dL (ref 6–23)
CHLORIDE: 100 meq/L (ref 96–112)
CO2: 25 meq/L (ref 19–32)
CREATININE: 0.88 mg/dL (ref 0.50–1.10)
Calcium: 9.7 mg/dL (ref 8.4–10.5)
GFR calc Af Amer: 82 mL/min — ABNORMAL LOW (ref >90)
GFR, EST NON AFRICAN AMERICAN: 71 mL/min — AB (ref >90)
GLUCOSE: 106 mg/dL — AB (ref 70–99)
Potassium: 4.2 mEq/L (ref 3.7–5.3)
Sodium: 138 mEq/L (ref 137–147)
Total Protein: 7.7 g/dL (ref 6.0–8.3)

## 2013-11-21 LAB — CBC WITH DIFFERENTIAL/PLATELET
Basophils Absolute: 0 10*3/uL (ref 0.0–0.1)
Basophils Relative: 1 % (ref 0–1)
Eosinophils Absolute: 0.2 10*3/uL (ref 0.0–0.7)
Eosinophils Relative: 2 % (ref 0–5)
HEMATOCRIT: 42.3 % (ref 36.0–46.0)
Hemoglobin: 14.3 g/dL (ref 12.0–15.0)
LYMPHS ABS: 1.5 10*3/uL (ref 0.7–4.0)
LYMPHS PCT: 23 % (ref 12–46)
MCH: 27.2 pg (ref 26.0–34.0)
MCHC: 33.8 g/dL (ref 30.0–36.0)
MCV: 80.4 fL (ref 78.0–100.0)
MONO ABS: 0.6 10*3/uL (ref 0.1–1.0)
Monocytes Relative: 10 % (ref 3–12)
Neutro Abs: 4.2 10*3/uL (ref 1.7–7.7)
Neutrophils Relative %: 65 % (ref 43–77)
Platelets: 353 10*3/uL (ref 150–400)
RBC: 5.26 MIL/uL — ABNORMAL HIGH (ref 3.87–5.11)
RDW: 14.1 % (ref 11.5–15.5)
WBC: 6.5 10*3/uL (ref 4.0–10.5)

## 2013-11-21 LAB — LIPID PANEL
CHOLESTEROL: 170 mg/dL (ref 0–200)
HDL: 55 mg/dL (ref >39)
LDL Cholesterol: 94 mg/dL (ref 0–99)
TRIGLYCERIDES: 105 mg/dL (ref <150)
Total CHOL/HDL Ratio: 3.1 RATIO
VLDL: 21 mg/dL (ref 0–40)

## 2013-11-21 LAB — HEMOGLOBIN A1C
Hgb A1c MFr Bld: 6.5 % — ABNORMAL HIGH (ref <5.7)
Mean Plasma Glucose: 140 mg/dL — ABNORMAL HIGH (ref <117)

## 2013-11-21 LAB — C-REACTIVE PROTEIN: CRP: <0.5 mg/dL — ABNORMAL LOW (ref <0.60)

## 2013-11-21 LAB — SEDIMENTATION RATE: SED RATE: 24 mm/h — AB (ref 0–22)

## 2013-11-21 MED ORDER — HYDROCODONE-ACETAMINOPHEN 5-325 MG PO TABS
1.0000 | ORAL_TABLET | ORAL | Status: DC | PRN
  Administered 2013-11-21 – 2013-11-25 (×15): 2 via ORAL
  Filled 2013-11-21 (×15): qty 2

## 2013-11-21 MED ORDER — ONDANSETRON HCL 4 MG PO TABS: 4.0000 mg | ORAL_TABLET | Freq: Four times a day (QID) | ORAL | Status: DC | PRN

## 2013-11-21 MED ORDER — MORPHINE SULFATE 4 MG/ML IJ SOLN
4.0000 mg | Freq: Once | INTRAMUSCULAR | Status: AC
  Administered 2013-11-21: 4 mg via INTRAVENOUS
  Filled 2013-11-21: qty 1

## 2013-11-21 MED ORDER — VANCOMYCIN HCL 500 MG IV SOLR
500.0000 mg | Freq: Two times a day (BID) | INTRAVENOUS | Status: DC
  Administered 2013-11-21 – 2013-11-24 (×6): 500 mg via INTRAVENOUS
  Filled 2013-11-21 (×10): qty 500

## 2013-11-21 MED ORDER — PIPERACILLIN-TAZOBACTAM 3.375 G IVPB 30 MIN
3.3750 g | Freq: Once | INTRAVENOUS | Status: AC
  Administered 2013-11-21: 3.375 g via INTRAVENOUS
  Filled 2013-11-21: qty 50

## 2013-11-21 MED ORDER — ENOXAPARIN SODIUM 40 MG/0.4ML ~~LOC~~ SOLN
40.0000 mg | SUBCUTANEOUS | Status: DC
  Administered 2013-11-21 – 2013-11-23 (×3): 40 mg via SUBCUTANEOUS
  Filled 2013-11-21 (×6): qty 0.4

## 2013-11-21 MED ORDER — LISINOPRIL 20 MG PO TABS
20.0000 mg | ORAL_TABLET | Freq: Every day | ORAL | Status: DC
  Filled 2013-11-21: qty 1

## 2013-11-21 MED ORDER — SODIUM CHLORIDE 0.9 % IJ SOLN
3.0000 mL | Freq: Two times a day (BID) | INTRAMUSCULAR | Status: DC
  Administered 2013-11-25: 3 mL via INTRAVENOUS

## 2013-11-21 MED ORDER — ASPIRIN 81 MG PO TABS
81.0000 mg | ORAL_TABLET | Freq: Every day | ORAL | Status: DC
  Administered 2013-11-22: 81 mg via ORAL
  Filled 2013-11-21 (×4): qty 1

## 2013-11-21 MED ORDER — ONDANSETRON HCL 4 MG/2ML IJ SOLN
4.0000 mg | Freq: Four times a day (QID) | INTRAMUSCULAR | Status: DC | PRN
  Filled 2013-11-21: qty 2

## 2013-11-21 MED ORDER — PIPERACILLIN-TAZOBACTAM 3.375 G IVPB
3.3750 g | Freq: Three times a day (TID) | INTRAVENOUS | Status: DC
  Administered 2013-11-21 – 2013-11-24 (×9): 3.375 g via INTRAVENOUS
  Filled 2013-11-21 (×11): qty 50

## 2013-11-21 MED ORDER — HYDROMORPHONE HCL PF 1 MG/ML IJ SOLN
0.5000 mg | INTRAMUSCULAR | Status: DC | PRN
  Administered 2013-11-22 – 2013-11-23 (×3): 0.5 mg via INTRAVENOUS
  Filled 2013-11-21 (×3): qty 1

## 2013-11-21 MED ORDER — ONDANSETRON HCL 4 MG/2ML IJ SOLN
4.0000 mg | Freq: Once | INTRAMUSCULAR | Status: AC
  Administered 2013-11-21: 4 mg via INTRAVENOUS
  Filled 2013-11-21: qty 2

## 2013-11-21 MED ORDER — HYDROMORPHONE HCL PF 1 MG/ML IJ SOLN
1.0000 mg | INTRAMUSCULAR | Status: DC | PRN
  Administered 2013-11-21: 0.5 mg via INTRAVENOUS
  Filled 2013-11-21: qty 1

## 2013-11-21 NOTE — ED Notes (Signed)
Pedal pulse positive with doppler

## 2013-11-21 NOTE — ED Notes (Signed)
Patient transported to X-ray 

## 2013-11-21 NOTE — H&P (Signed)
[facility] History and Physical  Mercedes [name]:[id number] DOB: January 31, 1956 DOA: 11/21/2013  Referring physician: ED physician PCP: Rene Paci, MD   Chief Complaint: left great toe pain   HPI:  Pt is 58 yo female with HTN, HLD, CAD, PVD, NSTEMI in 2010, SLE, Sjogren's disease, former smoker but quit several years ago, presented to [facility] ED with main concern of more than one week duration of progressively worsening left great toe pain, constant and throbbing, 7/10 in severity and worse with weight bearing and touching, no specific alleviating factors, associated with yellow pus. She explains she has had subungual abscess and has required I&D done my Dr. Antoine Primas (11/12/2013) and was placed on Keflex. Pain has gotten worse since this I&D and Dr. Katrinka Blazing was concerned for osteomyelitis and progressive ischemia given history of PVD. Pt denies any specific trauma to the foot, no similar events in the past, no numbness or tingling, no changes in sensation, no fevers, chills, no specific concerns such as chest pain, shortness of breath, no abdominal or urinary concerns.   In ED, pt hemodynamically stable, blood work including CBC and BMP unremarkable, physical exam findings certainly worrisome for osteo and with underlying ischemia. TRh asked to admit for further management and vascular surgery consulted for further recommendations.   Assessment and Plan: Active Problems: Left great toe osteomyelitis - with underlying ischemia - will admit to telemetry bed - place on broad spectrum ABX for now Vancomycin and Zosyn - MRI of the left foot pending - appreciate VVS input, will follow up on recommendations - continue analgesia as needed for symptom control  HTN - continue Lisinopril  - renal function stable Bradycardia - pt is asymptomatic and denies dizziness, chest pain - will monitor on telemetry - avoid BB due to soft HR  HLD - check lipid panel   Radiological Exams on  Admission: Dg Foot Complete Left   11/21/2013   Mild osseous resorption at the distal tuft of the first distal phalanx concerning for osteomyelitis given the overlying severe soft tissue swelling.  Code Status: Full Family Communication: Pt at bedside Disposition Plan: Admit for further evaluation     Review of Systems:  Constitutional: Negative for fever, chills . Negative for diaphoresis.  HENT: Negative for hearing loss, ear pain, nosebleeds, congestion, sore throat, neck pain, tinnitus and ear discharge.   Eyes: Negative for blurred vision, double vision, photophobia, pain, discharge and redness.  Respiratory: Negative for cough, hemoptysis, sputum production, shortness of breath, wheezing and stridor.   Cardiovascular: Negative for chest pain, palpitations, orthopnea, claudication and leg swelling.  Gastrointestinal: Negative for nausea, vomiting and abdominal pain. Negative for heartburn, constipation, blood in stool and melena.  Genitourinary: Negative for dysuria, urgency, frequency, hematuria and flank pain.  Musculoskeletal: Negative for myalgias, back pain, joint pain and falls.  Skin: Negative for itching and rash.  Neurological: Negative for tingling, tremors, sensory change, speech change, focal weakness, loss of consciousness and headaches.  Endo/Heme/Allergies: Negative for environmental allergies and polydipsia. Does not bruise/bleed easily.  Psychiatric/Behavioral: Negative for suicidal ideas. The patient is not nervous/anxious.      Past Medical History  Diagnosis Date  . 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 PAD (peripheral artery disease)     eval by Dr. [name] in past but patient canceled angiogram  . RA (rheumatoid arthritis)     prior tx by [name]  . GERD (gastroesophageal  reflux disease)   . Dizziness     ? CVA 01/2010 - carotid dopplers with no ICA stenosis  . Arnold-Chiari malformation,  type I     MRI brain 02/2010  . Bradycardia   . Tobacco abuse     quit 04/2013  . DEPRESSION   . HYPERLIPIDEMIA   . HYPERTENSION   . CAD (coronary artery disease)     a. NSTEMI 07/2009: LHC - D2 40%, LAD irreg., EF 50% with apical AK (tako-tsubo CM);  b. Inf STEMI (04/2013): Tx Promus DES to CFX;  c. 08/2013 Lexi CL: No ischemia, dist ant/ap/inf-ap infarct, EF   . SLE (systemic lupus erythematosus) dx 05/2013  . Sjogren's syndrome 06/02/13  . Abnormal LFTs     a. 08/2013 Lipitor 40 d/c'd and crestor 10 started - f/u lft's scheduled for 10/01/2013.    Past Surgical History  Procedure Laterality Date  . None    . Coronary angioplasty    . Tubal ligation      Social History:  reports that she quit smoking about 6 months ago. Her smoking use included Cigarettes. She smoked 0.00 packs per day. She quit smokeless tobacco use about 6 months ago. She reports that she drinks alcohol. She reports that she does not use illicit drugs.  No Known Allergies  Family History  Problem Relation Age of Onset  . Arthritis Mother   . Arthritis Father   . Diabetes Other     Prior to Admission medications   Medication Sig Start Date End Date Taking? Authorizing Provider  aspirin 81 MG tablet Take 1 tablet (81 mg total) by mouth daily. 05/21/13  Yes Jennene G Barrett, PA-C  cephALEXin (KEFLEX) 500 MG capsule Take 1 capsule (500 mg total) by mouth 2 (two) times daily. 11/12/13  Yes Judi Saa, DO  lisinopril (PRINIVIL,ZESTRIL) 20 MG tablet Take 1 tablet (20 mg total) by mouth daily. 05/27/13  Yes Dorothea Ogle, MD    Physical Exam: Filed Vitals:   11/21/13 1122 11/21/13 1230 11/21/13 1408 11/21/13 1445  BP: 132/91 142/86 123/70 131/72  Pulse: 78 65 53 47  Temp: 98.1 F (36.7 C)     TempSrc: Oral     Resp: 18  18   SpO2: 100% 99% 100% 97%    Physical Exam  Constitutional: Appears well-developed and well-nourished. No distress.  HENT: Normocephalic. External right and left ear normal. Oropharynx  is clear and moist.  Eyes: Conjunctivae and EOM are normal. PERRLA, no scleral icterus.  Neck: Normal ROM. Neck supple. No JVD. No tracheal deviation. No thyromegaly.  CVS: Regular rhythm, bradycardia, S1/S2 +, no murmurs, no gallops, no carotid bruit.  Pulmonary: Effort and breath sounds normal, no stridor, rhonchi, wheezes, rales.  Abdominal: Soft. BS +,  no distension, tenderness, rebound or guarding.  Musculoskeletal: Normal range of motion. Left great toe ischemic and appears necrotic, with surrounding erythema, swelling, warm to touch  Lymphadenopathy: No lymphadenopathy noted, cervical, inguinal. Neuro: Alert. Normal reflexes, muscle tone coordination. No cranial nerve deficit. Skin: Skin is warm and dry. No rash noted. Not diaphoretic. No erythema. No pallor.  Psychiatric: Normal mood and affect. Behavior, judgment, thought content normal.   Labs on Admission:  Basic Metabolic Panel:  Recent Labs Lab 11/21/13 1222  NA 138  K 4.2  CL 100  CO2 25  GLUCOSE 106*  BUN 10  CREATININE 0.88  CALCIUM 9.7   Liver Function Tests:  Recent Labs Lab 11/21/13 1222  AST 32  ALT  36*  ALKPHOS 105  BILITOT 0.3  PROT 7.7  ALBUMIN 3.7   CBC:  Recent Labs Lab 11/21/13 1222  WBC 6.5  NEUTROABS 4.2  HGB 14.3  HCT 42.3  MCV 80.4  PLT 353   EKG: Normal sinus rhythm, no ST/T wave changes  Debbora Presto, MD  [facility] Pager [phone]  If 7PM-7AM, please contact night-coverage [email or web address] Password [email or web address] 11/21/2013, 3:53 PM

## 2013-11-21 NOTE — ED Provider Notes (Signed)
CSN: [id number]     Arrival date & time 11/21/13  1115 History   First MD Initiated Contact with Patient 11/21/13 1126     Chief Complaint  Patient presents with  . Foot Pain     (Consider location/radiation/quality/duration/timing/severity/associated sxs/prior Treatment) HPI Mercedes [name] 58 year old female who presents to the emergency department for evaluation of her left great toe.  The patient has had ongoing pain in the left great toe and has been followed at our primary care by Dr. Antoine Primas.  Patient had an apparent subungual abscess of the toe nail him with I&D by Dr. Katrinka Blazing on 11/12/2013.  Patient states that she's had worsening severe pain in the right eye.  She's been back to see Dr. Katrinka Blazing and his note in the chart review states he is concerned for osteomyelitis, and she has known peripheral vascular disease and there is concern for ischemia.  She complains of severe pain, she's had some drainage.  The patient denies fevers, chills, nausea or other signs of systemic infection.  She denies history of diabetes.  Patient has a long-term history of tobacco abuse but no longer smokes.  She also has a history of a rheumatologic disorders including SLE, Sjogren's syndrome.  She is history of past Takotsubo cardiomyopathy.    Past Medical History  Diagnosis Date  . 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 PAD (peripheral artery disease)     eval by Dr. [name] in past but patient canceled angiogram  . RA (rheumatoid arthritis)     prior tx by [name]  . GERD (gastroesophageal reflux disease)   . Dizziness     ? CVA 01/2010 - carotid dopplers with no ICA stenosis  . Arnold-Chiari malformation, type I     MRI brain 02/2010  . Bradycardia   . Tobacco abuse     quit 04/2013  . DEPRESSION   . HYPERLIPIDEMIA   . HYPERTENSION   . CAD (coronary artery disease)     a. NSTEMI 07/2009: LHC - D2 40%, LAD irreg., EF 50%  with apical AK (tako-tsubo CM);  b. Inf STEMI (04/2013): Tx Promus DES to CFX;  c. 08/2013 Lexi CL: No ischemia, dist ant/ap/inf-ap infarct, EF   . SLE (systemic lupus erythematosus) dx 05/2013  . Sjogren's syndrome 06/02/13  . Abnormal LFTs     a. 08/2013 Lipitor 40 d/c'd and crestor 10 started - f/u lft's scheduled for 10/01/2013.   Past Surgical History  Procedure Laterality Date  . None    . Coronary angioplasty    . Tubal ligation     Family History  Problem Relation Age of Onset  . Arthritis Mother   . Arthritis Father   . Diabetes Other    History  Substance Use Topics  . Smoking status: Former Smoker    Types: Cigarettes    Quit date: 05/19/2013  . Smokeless tobacco: Former Neurosurgeon    Quit date: 05/19/2013     Comment:    . Alcohol Use: Yes     Comment: i DONT DRINK ANYMORE"   OB History   Grav Para Term Preterm Abortions TAB SAB Ect Mult Living                 Review of Systems  Ten systems reviewed and are negative for acute change, except as noted in the HPI.    Allergies  Review of patient's allergies indicates no  known allergies.  Home Medications   Current Outpatient Rx  Name  Route  Sig  Dispense  Refill  . aspirin 81 MG tablet   Oral   Take 1 tablet (81 mg total) by mouth daily.   30 tablet      . cephALEXin (KEFLEX) 500 MG capsule   Oral   Take 1 capsule (500 mg total) by mouth 2 (two) times daily.   20 capsule   0   . fluconazole (DIFLUCAN) 150 MG tablet   Oral   Take 1 tablet (150 mg total) by mouth daily.   10 tablet   0   . hydroxychloroquine (PLAQUENIL) 200 MG tablet   Oral   Take 1 tablet (200 mg total) by mouth daily.         [name] lisinopril (PRINIVIL,ZESTRIL) 20 MG tablet   Oral   Take 1 tablet (20 mg total) by mouth daily.   90 tablet   3   . naproxen (NAPROSYN) 500 MG tablet   Oral   Take 1 tablet (500 mg total) by mouth 2 (two) times daily with a meal.   10 tablet   3   . nitroGLYCERIN (NITROSTAT) 0.4 MG SL tablet    Sublingual   Place 1 tablet (0.4 mg total) under the tongue every 5 (five) minutes as needed for chest pain.   25 tablet   3   . rosuvastatin (CRESTOR) 10 MG tablet   Oral   Take 1 tablet (10 mg total) by mouth daily.   30 tablet   6   . Ticagrelor (BRILINTA) 90 MG TABS tablet   Oral   Take 1 tablet (90 mg total) by mouth 2 (two) times daily.   60 tablet   11   . traMADol (ULTRAM) 50 MG tablet   Oral   Take 1 tablet (50 mg total) by mouth every 8 (eight) hours as needed.   30 tablet   0    BP 132/91  Pulse 78  Temp(Src) 98.1 F (36.7 C) (Oral)  Resp 18  SpO2 100% Physical Exam  Constitutional: She is oriented to person, place, and time. She appears well-developed and well-nourished. No distress.  HENT:  Head: Normocephalic and atraumatic.  Eyes: Conjunctivae are normal. No scleral icterus.  Neck: Normal range of motion.  Cardiovascular: Normal rate, regular rhythm and normal heart sounds.  Exam reveals no gallop and no friction rub.   No murmur heard. Unable to palpate Pulses in BL feet. Doppler confirmed Monophasic DP/PT pulses. Faint and intermittent  Pulmonary/Chest: Effort normal and breath sounds normal. No respiratory distress.  Abdominal: Soft. Bowel sounds are normal. She exhibits no distension and no mass. There is no tenderness. There is no guarding.  Musculoskeletal:  L great toe with Dark appearance, Hot, read and swollen. Area of Missing nail on the Medial nail bed. Appears to be necrotic with some purulent discharge.  Neurological: She is alert and oriented to person, place, and time.  Skin: Skin is warm and dry. She is not diaphoretic.    ED Course  Procedures (including critical care time) Labs Review Labs Reviewed - No data to display Imaging Review No results found.   EKG Interpretation None      MDM   Final diagnoses:  Osteomyelitis  Peripheral vascular disease    Patient with Osteomyelitis.I personally reviewed the images using our  PACS system.  Poor peripheral pulses and known vasculopath. Patient placed on zosyn for infection. I have spoken wit  Dr. Izola Price who will admit the patient. Dr Darrick Penna of vascular surgery will consult.    Arthor Captain, PA-C 11/21/13 1545

## 2013-11-21 NOTE — Progress Notes (Addendum)
ANTIBIOTIC CONSULT NOTE - INITIAL  Pharmacy Consult for zosyn Indication: wound infection  No Known Allergies  Patient Measurements:    Weight: 53 kg  Vital Signs: Temp: 98.1 F (36.7 C) (03/27 1122) Temp src: Oral (03/27 1122) BP: 142/86 mmHg (03/27 1230) Pulse Rate: 65 (03/27 1230) Intake/Output from previous day:   Intake/Output from this shift:    Labs:  Recent Labs  11/21/13 1222  WBC 6.5  HGB 14.3  PLT 353  CREATININE 0.88   The CrCl is unknown because both a height and weight (above a minimum accepted value) are required for this calculation. No results found for this basename: VANCOTROUGH, VANCOPEAK, VANCORANDOM, GENTTROUGH, GENTPEAK, GENTRANDOM, TOBRATROUGH, TOBRAPEAK, TOBRARND, AMIKACINPEAK, AMIKACINTROU, AMIKACIN,  in the last 72 hours   Microbiology: No results found for this or any previous visit (from the past 720 hour(s)).  Medical History: Past Medical History  Diagnosis Date  . 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 PAD (peripheral artery disease)     eval by Dr. [name] in past but patient canceled angiogram  . RA (rheumatoid arthritis)     prior tx by [name]  . GERD (gastroesophageal reflux disease)   . Dizziness     ? CVA 01/2010 - carotid dopplers with no ICA stenosis  . Arnold-Chiari malformation, type I     MRI brain 02/2010  . Bradycardia   . Tobacco abuse     quit 04/2013  . DEPRESSION   . HYPERLIPIDEMIA   . HYPERTENSION   . CAD (coronary artery disease)     a. NSTEMI 07/2009: LHC - D2 40%, LAD irreg., EF 50% with apical AK (tako-tsubo CM);  b. Inf STEMI (04/2013): Tx Promus DES to CFX;  c. 08/2013 Lexi CL: No ischemia, dist ant/ap/inf-ap infarct, EF   . SLE (systemic lupus erythematosus) dx 05/2013  . Sjogren's syndrome 06/02/13  . Abnormal LFTs     a. 08/2013 Lipitor 40 d/c'd and crestor 10 started - f/u lft's scheduled for 10/01/2013.   Assessment: 58 yo F to  start zosyn per pharmacy for L great toe subungual abscess s/p I&D 11/12/13.  MD concerned for osteomyelitis with known PAD.  Pt c/o severe pain and drainage.  On Keflex 500mg  po BID since 3/18.   WBC 6.5, creat 0.88; AF.  WT 53 kg.   Goal of Therapy:  Eradicate infection  Plan:  1. Zosyn 3.375 gm IV x 1 dose over 30 minutes asap, then zosyn 3.375 gm IV q8h, infuse each dose over 4 hours 2. F/u renal fxn, clinical course, foot imaging studies.  4/18, Pharm.D. Herby Abraham 11/21/2013 2:04 PM  PM: Adding Vancomycin 500 mg iv Q 12 hours  Thank  You. 11/23/2013, PharmD

## 2013-11-21 NOTE — ED Notes (Signed)
Returned from xray

## 2013-11-21 NOTE — Progress Notes (Addendum)
VASCULAR LAB PRELIMINARY  ARTERIAL  ABI completed:    RIGHT    LEFT    PRESSURE WAVEFORM  PRESSURE WAVEFORM  BRACHIAL  biphasic BRACHIAL 123 biphasic  DP   DP    AT 49 Dampened monophasic AT 62 Severely dampened monophasic  PT 74 Severely dampened monophasic PT 63 Severely dampened monophasic  PER   PER 60 Dampened monophasic  GREAT TOE  flat GREAT TOE  flat    RIGHT LEFT  ABI 0.6 0.51     Lindsie Simar, RVT 11/21/2013, 4:20 PM

## 2013-11-21 NOTE — Telephone Encounter (Signed)
Patient called in stating that the toe pain is much worse. There was concern the patient was having some mild necrosis of the toe. I'm concerned the patient may have an underlying osteomyelitis that seems to be worsening as well. Encourage patient to go to the emergency department for further evaluation. Hopefully there they will get x-rays and likely an MRI of the foot. In addition to that she probably will need to be admitted and possibly have the amputation of the toe. We discussed this over the phone and patient is expecting further workup hopefully. Patient does not know if she'll go to [facility] long or: Emergency still unable to call physician on call

## 2013-11-21 NOTE — Telephone Encounter (Signed)
Phone call from Pt stating she has been in pain since she left the office yesterday. Please advise  CB# [phone]

## 2013-11-21 NOTE — ED Notes (Addendum)
Pt reports that she had a area drained on her left foot yesterday. States the great toe is turning color and decreased sensation.

## 2013-11-22 ENCOUNTER — Inpatient Hospital Stay (HOSPITAL_COMMUNITY): Payer: Medicare Other

## 2013-11-22 DIAGNOSIS — I739 Peripheral vascular disease, unspecified: Secondary | ICD-10-CM

## 2013-11-22 DIAGNOSIS — I70269 Atherosclerosis of native arteries of extremities with gangrene, unspecified extremity: Secondary | ICD-10-CM

## 2013-11-22 DIAGNOSIS — M79609 Pain in unspecified limb: Secondary | ICD-10-CM

## 2013-11-22 DIAGNOSIS — L0291 Cutaneous abscess, unspecified: Secondary | ICD-10-CM

## 2013-11-22 DIAGNOSIS — I498 Other specified cardiac arrhythmias: Secondary | ICD-10-CM

## 2013-11-22 DIAGNOSIS — I25810 Atherosclerosis of coronary artery bypass graft(s) without angina pectoris: Secondary | ICD-10-CM

## 2013-11-22 DIAGNOSIS — E785 Hyperlipidemia, unspecified: Secondary | ICD-10-CM

## 2013-11-22 DIAGNOSIS — L0390 Cellulitis, unspecified: Secondary | ICD-10-CM

## 2013-11-22 LAB — BASIC METABOLIC PANEL
BUN: 12 mg/dL (ref 6–23)
CO2: 22 mEq/L (ref 19–32)
CREATININE: 1.13 mg/dL — AB (ref 0.50–1.10)
Calcium: 9.2 mg/dL (ref 8.4–10.5)
Chloride: 98 mEq/L (ref 96–112)
GFR, EST AFRICAN AMERICAN: 60 mL/min — AB (ref >90)
GFR, EST NON AFRICAN AMERICAN: 52 mL/min — AB (ref >90)
Glucose, Bld: 98 mg/dL (ref 70–99)
POTASSIUM: 4.2 meq/L (ref 3.7–5.3)
Sodium: 135 mEq/L — ABNORMAL LOW (ref 137–147)

## 2013-11-22 LAB — CBC
HCT: 39.5 % (ref 36.0–46.0)
HEMOGLOBIN: 12.8 g/dL (ref 12.0–15.0)
MCH: 26.2 pg (ref 26.0–34.0)
MCHC: 32.4 g/dL (ref 30.0–36.0)
MCV: 80.8 fL (ref 78.0–100.0)
Platelets: 305 10*3/uL (ref 150–400)
RBC: 4.89 MIL/uL (ref 3.87–5.11)
RDW: 14.4 % (ref 11.5–15.5)
WBC: 5.4 10*3/uL (ref 4.0–10.5)

## 2013-11-22 LAB — TSH: TSH: 0.775 u[IU]/mL (ref 0.350–4.500)

## 2013-11-22 MED ORDER — SODIUM CHLORIDE 0.9 % IV SOLN
INTRAVENOUS | Status: DC
  Administered 2013-11-22 – 2013-11-24 (×2): via INTRAVENOUS

## 2013-11-22 MED ORDER — ONDANSETRON HCL 4 MG/2ML IJ SOLN
4.0000 mg | INTRAMUSCULAR | Status: DC | PRN
  Administered 2013-11-22 – 2013-11-23 (×5): 4 mg via INTRAVENOUS
  Filled 2013-11-22 (×5): qty 2

## 2013-11-22 NOTE — Consult Note (Addendum)
[facility] HISTORY AND PHYSICAL   History of Present Illness:  Patient is a 58 y.o. year old female who presents for evaluation of pain and dark discoloration of left first toe.  Pt began having pain and color changes in that toe 4 months ago.  She has had slow progressive worsening.  She has chronic claudication left > right at 1 block which has been present at least 6 months.  She quit smoking 6 months ago.  No prior leg procedures.  She was admitted yesterday for pain control and IV antibiotics.  Other medical problems include cardiomyopathy, arthritis, reflux, hyperlipidemia, hypertension, coronary disease, lupus, Sjogren's all currently stable.  Hemoglobin A1C consistant with Diabetes.  Past Medical History  Diagnosis Date  . 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 PAD (peripheral artery disease)     eval by Dr. [name] in past but patient canceled angiogram  . RA (rheumatoid arthritis)     prior tx by [name]  . GERD (gastroesophageal reflux disease)   . Dizziness     ? CVA 01/2010 - carotid dopplers with no ICA stenosis  . Arnold-Chiari malformation, type I     MRI brain 02/2010  . Bradycardia   . Tobacco abuse     quit 04/2013  . DEPRESSION   . HYPERLIPIDEMIA   . HYPERTENSION   . CAD (coronary artery disease)     a. NSTEMI 07/2009: LHC - D2 40%, LAD irreg., EF 50% with apical AK (tako-tsubo CM);  b. Inf STEMI (04/2013): Tx Promus DES to CFX;  c. 08/2013 Lexi CL: No ischemia, dist ant/ap/inf-ap infarct, EF   . SLE (systemic lupus erythematosus) dx 05/2013  . Sjogren's syndrome 06/02/13  . Abnormal LFTs     a. 08/2013 Lipitor 40 d/c'd and crestor 10 started - f/u lft's scheduled for 10/01/2013.    Past Surgical History  Procedure Laterality Date  . None    . Coronary angioplasty    . Tubal ligation      Social History History  Substance Use Topics  . Smoking status: Former Smoker     Types: Cigarettes    Quit date: 05/19/2013  . Smokeless tobacco: Former Neurosurgeon    Quit date: 05/19/2013     Comment:    . Alcohol Use: Yes     Comment: i DONT DRINK ANYMORE"    Family History Family History  Problem Relation Age of Onset  . Arthritis Mother   . Arthritis Father   . Diabetes Other     Allergies  No Known Allergies   Current Facility-Administered Medications  Medication Dose Route Frequency Provider Last Rate Last Dose  . 0.9 %  sodium chloride infusion   Intravenous Continuous Ripudeep Jenna Luo, MD 100 mL/hr at 11/22/13 0809    . aspirin tablet 81 mg  81 mg Oral Daily Dorothea Ogle, MD      . enoxaparin (LOVENOX) injection 40 mg  40 mg Subcutaneous Q24H Dorothea Ogle, MD   40 mg at 11/21/13 2045  . HYDROcodone-acetaminophen (NORCO/VICODIN) 5-325 MG per tablet 1-2 tablet  1-2 tablet Oral Q4H PRN Dorothea Ogle, MD   2 tablet at 11/22/13 0136  . HYDROmorphone (DILAUDID) injection 0.5 mg  0.5 mg Intravenous Q2H PRN Dorothea Ogle, MD   0.5 mg at 11/22/13 0549  . ondansetron (ZOFRAN) injection 4 mg  4 mg Intravenous Q4H PRN Ripudeep  Jenna Luo, MD      . piperacillin-tazobactam (ZOSYN) IVPB 3.375 g  3.375 g Intravenous 3 times per day Herby Abraham, RPH   3.375 g at 11/22/13 0546  . sodium chloride 0.9 % injection 3 mL  3 mL Intravenous Q12H Dorothea Ogle, MD      . vancomycin (VANCOCIN) 500 mg in sodium chloride 0.9 % 100 mL IVPB  500 mg Intravenous Q12H Dorothea Ogle, MD   500 mg at 11/21/13 2044    ROS:   General:  No weight loss, Fever, chills  HEENT: No recent headaches, no nasal bleeding, no visual changes, no sore throat  Neurologic: No dizziness, blackouts, seizures. No recent symptoms of stroke or mini- stroke. No recent episodes of slurred speech, or temporary blindness.  Cardiac: No recent episodes of chest pain/pressure, no shortness of breath at rest.  No shortness of breath with exertion.  Denies history of atrial fibrillation or irregular  heartbeat  Vascular: + history of rest pain in feet.  + history of claudication.  + history of non-healing ulcer, No history of DVT   Pulmonary: No home oxygen, no productive cough, no hemoptysis,  No asthma or wheezing  Musculoskeletal:  [ ]  Arthritis, [ ]  Low back pain,  [x Joint pain  Hematologic:No history of hypercoagulable state.  No history of easy bleeding.  No history of anemia  Gastrointestinal: No hematochezia or melena,  No gastroesophageal reflux, no trouble swallowing  Urinary: [ ]  chronic Kidney disease, [ ]  on HD - [ ]  MWF or [ ]  TTHS, [ ]  Burning with urination, [ ]  Frequent urination, [ ]  Difficulty urinating;   Skin: No rashes  Psychological: No history of anxiety,  No history of depression   Physical Examination  Filed Vitals:   11/21/13 1445 11/21/13 1731 11/21/13 2109 11/22/13 0603  BP: 131/72 107/51 120/60 98/52  Pulse: 47 57 53 64  Temp:  97.8 F (36.6 C) 98.6 F (37 C) 98.9 F (37.2 C)  TempSrc:  Oral Oral Oral  Resp:  20 18   Height:  5\' 6"  (1.676 m)    Weight:  119 lb (53.978 kg)    SpO2: 97% 99% 96% 100%    Body mass index is 19.22 kg/(m^2).  General:  Alert and oriented, no acute distress HEENT: Normal Neck: No bruit or JVD Pulmonary: Clear to auscultation bilaterally Cardiac: Regular Rate and Rhythm Abdomen: Soft, non-tender, non-distended, no mass Skin: No rash, early gangrenous changes left first toe, no erythema no abscess no drainage Extremity Pulses:  2+ radial, brachial, femoral, absent popliteal dorsalis pedis, posterior tibial pulses bilaterally Musculoskeletal: No deformity or edema  Neurologic: Upper and lower extremity motor 5/5 and symmetric  DATA:  ABI 0.6 Right, 0.5 left yesterday   ASSESSMENT:  Gangrene left first toe with claudication and probably rest pain.  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  Diabetes via Hgb A1C per primary team    PLAN:  Aortogram with bilateral lower extremity runoff  possible intervention on Monday 11/24/13 NPO p midnight Sunday consent  , MD [facility] Office: [phone] Pager: [phone]

## 2013-11-22 NOTE — Progress Notes (Signed)
INITIAL NUTRITION ASSESSMENT  DOCUMENTATION CODES Per approved criteria  -Not Applicable   INTERVENTION: 1.  General healthful diet; encourage intake of foods and beverages as able.  RD to follow and assess for nutritional adequacy.   NUTRITION DIAGNOSIS: Inadequate oral intake related to pain as evidenced by pt report.   Monitor:  1.  Food/Beverage; pt meeting >/=90% estimated needs with tolerance. 2.  Wt/wt change; monitor trends  Reason for Assessment: MST  58 y.o. female  Admitting Dx: Cellulitis  ASSESSMENT: Pt admitted with cellulitis.  RD met with pt who reports she has lost weight from 128 lbs.  Per chart review, this 7.8% wt loss has occurred over several years. Pt states she has overall recently been stable.  She reports dislike of hospital foods and requests family be able to bring her meals "every once in a while."  Pt reports she was not eating well PTA due to pain in her foot. Since it has been under control, her appetite has picked up.  Pt declines offer for nutrition supplements such as Ensure or Breeze, stating "I'd rather have a milk shake. I have one 2-3 times per week to help me keep weight."  Nutrition Focused Physical Exam: Subcutaneous Fat:  Orbital Region: WNL Upper Arm Region: WNL Thoracic and Lumbar Region: WNL  Muscle:  Temple Region: WNL Clavicle Bone Region: moderate wasting Clavicle and Acromion Bone Region: mild-moderate wasting Scapular Bone Region: WNL Dorsal Hand: mild-moderate wasting Patellar Region: WNL Anterior Thigh Region: WNL Posterior Calf Region: WNL  Edema: none present  Height: Ht Readings from Last 1 Encounters:  11/21/13 _0  (1.676 m)    Weight: Wt Readings from Last 1 Encounters:  11/21/13 119 lb (53.978 kg)    Ideal Body Weight: 130 lbs  % Ideal Body Weight: 91%  Wt Readings from Last 10 Encounters:  11/21/13 119 lb (53.978 kg)  11/12/13 117 lb (53.071 kg)  11/07/13 118 lb 12.8 oz (53.887 kg)   09/16/13 122 lb 1.9 oz (55.393 kg)  09/02/13 121 lb (54.885 kg)  09/02/13 118 lb (53.524 kg)  08/07/13 122 lb 9.6 oz (55.611 kg)  07/21/13 126 lb (57.153 kg)  07/04/13 119 lb 6.4 oz (54.159 kg)  06/13/13 116 lb 6.4 oz (52.799 kg)    Usual Body Weight: 120 lbs  % Usual Body Weight: 99%  BMI:  Body mass index is 19.22 kg/(m^2).  Estimated Nutritional Needs: Kcal: 1500-1700 Protein: 60-70g Fluid: ~1.8 L/day  Skin: no issues noted  Diet Order: Cardiac  EDUCATION NEEDS: -Education needs addressed  No intake or output data in the 24 hours ending 11/22/13 1315  Last BM: 3/26  Labs:   Recent Labs Lab 11/21/13 1222 11/22/13 0500  NA 138 135*  K 4.2 4.2  CL 100 98  CO2 25 22  BUN 10 12  CREATININE 0.88 1.13*  CALCIUM 9.7 9.2  GLUCOSE 106* 98    CBG (last 3)  No results found for this basename: GLUCAP,  in the last 72 hours  Scheduled Meds: . aspirin  81 mg Oral Daily  . enoxaparin (LOVENOX) injection  40 mg Subcutaneous Q24H  . piperacillin-tazobactam (ZOSYN)  IV  3.375 g Intravenous 3 times per day  . sodium chloride  3 mL Intravenous Q12H  . vancomycin  500 mg Intravenous Q12H    Continuous Infusions: . sodium chloride 100 mL/hr at 11/22/13 8786    Past Medical History  Diagnosis Date  . Takotsubo cardiomyopathy 12.2010    f/u echo 09/2009:  EF 50-55%, mild LVH, mod diast dysfxn, mild apical HK  . PAD (peripheral artery disease)     eval by Dr. Angelena Form in past but patient canceled angiogram  . RA (rheumatoid arthritis)     prior tx by Justine Null  . GERD (gastroesophageal reflux disease)   . Dizziness     ? CVA 01/2010 - carotid dopplers with no ICA stenosis  . Arnold-Chiari malformation, type I     MRI brain 02/2010  . Bradycardia   . Tobacco abuse     quit 04/2013  . DEPRESSION   . HYPERLIPIDEMIA   . HYPERTENSION   . CAD (coronary artery disease)     a. NSTEMI 07/2009: LHC - D2 40%, LAD irreg., EF 50% with apical AK (tako-tsubo CM);  b. Inf STEMI  (04/2013): Tx Promus DES to CFX;  c. 08/2013 Lexi CL: No ischemia, dist ant/ap/inf-ap infarct, EF   . SLE (systemic lupus erythematosus) dx 05/2013  . Sjogren's syndrome 06/02/13  . Abnormal LFTs     a. 08/2013 Lipitor 40 d/c'd and crestor 10 started - f/u lft's scheduled for 10/01/2013.    Past Surgical History  Procedure Laterality Date  . None    . Coronary angioplasty    . Tubal ligation      Brynda Greathouse, MS RD LDN Clinical Inpatient Dietitian Pager: [phone] Weekend/After hours pager: [phone]

## 2013-11-22 NOTE — Progress Notes (Signed)
Patient ID: Mercedes [name]  female  [id number]    DOB: 1955/09/09    DOA: 11/21/2013  PCP: Rene Paci, MD  Assessment/Plan: Principal Problem:   Cellulitis with the left great toe gangrene - ABIs done left 0.51, right 0.6 - Vascular surgery consulted, recommended aortogram with bilateral LE runoff 3/30 - Continue IV vancomycin and Zosyn  Active Problems:   HYPERLIPIDEMIA - Not on any statins outpatient - Will check lipid panel    HYPERTENSION -On lisinopril outpatient however BP currently borderline low, hold lisinopril, place on IV fluids     PERIPHERAL VASCULAR DISEASE - Currently on aspirin    CAD  - Currently on aspirin, check lipid panel   DVT Prophylaxis:Lovenox  Code Status:Full code  Family Communication:  Disposition:  Consultants:  Vascular surgery  Procedures:  None  Antibiotics:  Vancomycin 3/27  Zosyn 3/27  Subjective: Patient seen and examined, having intractable nausea and vomiting, left great toe gangrene  Objective: Weight change:  No intake or output data in the 24 hours ending 11/22/13 1038 Blood pressure 98/52, pulse 64, temperature 98.9 F (37.2 C), temperature source Oral, resp. rate 18, height 5\' 6"  (1.676 m), weight 53.978 kg (119 lb), SpO2 100.00%.  Physical Exam: General: Alert and awake, oriented x3 miserable due to vomiting  CVS: S1-S2 clear, no murmur rubs or gallops Chest: clear to auscultation bilaterally, no wheezing, rales or rhonchi Abdomen: soft nontender, nondistended, normal bowel sounds  Extremities: no cyanosis, clubbing. Gangrenous changes of the left first toe, inflamed  Neuro: Cranial nerves II-XII intact, no focal neurological deficits  Lab Results: Basic Metabolic Panel:  Recent Labs Lab 11/21/13 1222 11/22/13 0500  NA 138 135*  K 4.2 4.2  CL 100 98  CO2 25 22  GLUCOSE 106* 98  BUN 10 12  CREATININE 0.88 1.13*  CALCIUM 9.7 9.2   Liver Function Tests:  Recent Labs Lab  11/21/13 1222  AST 32  ALT 36*  ALKPHOS 105  BILITOT 0.3  PROT 7.7  ALBUMIN 3.7   No results found for this basename: LIPASE, AMYLASE,  in the last 168 hours No results found for this basename: AMMONIA,  in the last 168 hours CBC:  Recent Labs Lab 11/21/13 1222 11/22/13 0500  WBC 6.5 5.4  NEUTROABS 4.2  --   HGB 14.3 12.8  HCT 42.3 39.5  MCV 80.4 80.8  PLT 353 305   Cardiac Enzymes: No results found for this basename: CKTOTAL, CKMB, CKMBINDEX, TROPONINI,  in the last 168 hours BNP: No components found with this basename: POCBNP,  CBG: No results found for this basename: GLUCAP,  in the last 168 hours   Micro Results: No results found for this or any previous visit (from the past 240 hour(s)).  Studies/Results: 11/24/13 Extrem Low Left Ltd  11/17/2013   Entered in error.   Dg Foot Complete Left  11/21/2013   CLINICAL DATA:  Great toe infection  EXAM: LEFT FOOT - COMPLETE 3+ VIEW  COMPARISON:  None.  FINDINGS: There is no acute fracture or dislocation. There is mild osseous resorption at the distal tuft of the first distal phalanx concerning for osteomyelitis. There is severe soft tissue swelling of the great toe. There is no subcutaneous emphysema. There is peripheral vascular atherosclerotic disease.  IMPRESSION: 1. Mild osseous resorption at the distal tuft of the first distal phalanx concerning for osteomyelitis given the overlying severe soft tissue swelling. Further evaluation with MRI would be helpful.   Electronically Signed  By: Elige Ko   On: 11/21/2013 13:09   Dg Toe Great Left  11/07/2013   CLINICAL DATA:  Pain.  EXAM: LEFT GREAT TOE  COMPARISON:  None.  FINDINGS: Erosive changes are noted of the distal tuft of the left great toe. An erosive lesion including osteomyelitis could present in this fashion. There is adjacent soft tissue swelling. Degenerative changes are noted about the first metacarpal phalangeal and first interphalangeal joint. Subchondral cyst is  noted in the distal aspect of the proximal phalanx medially. This is most likely degenerative. No radiopaque foreign bodies. Vascular calcification noted.  IMPRESSION: 1. Erosive changes noted distal tuft of the left great toe. Erosive process including osteomyelitis could present in this fashion. There is adjacent soft tissue swelling. No radiopaque foreign body.  2.  DJD.  3.  Peripheral vascular disease.   Electronically Signed   By: Maisie Fus  Register   On: 11/07/2013 13:44    Medications: Scheduled Meds: . aspirin  81 mg Oral Daily  . enoxaparin (LOVENOX) injection  40 mg Subcutaneous Q24H  . piperacillin-tazobactam (ZOSYN)  IV  3.375 g Intravenous 3 times per day  . sodium chloride  3 mL Intravenous Q12H  . vancomycin  500 mg Intravenous Q12H      LOS: 1 day   RAI,RIPUDEEP M.D. [facility] 11/22/2013, 10:38 AM Pager: [phone]  If 7PM-7AM, please contact night-coverage [email or web address] Password [email or web address]

## 2013-11-23 LAB — BASIC METABOLIC PANEL
BUN: 11 mg/dL (ref 6–23)
CHLORIDE: 102 meq/L (ref 96–112)
CO2: 23 meq/L (ref 19–32)
CREATININE: 1.04 mg/dL (ref 0.50–1.10)
Calcium: 8.9 mg/dL (ref 8.4–10.5)
GFR calc Af Amer: 67 mL/min — ABNORMAL LOW (ref >90)
GFR calc non Af Amer: 58 mL/min — ABNORMAL LOW (ref >90)
GLUCOSE: 93 mg/dL (ref 70–99)
Potassium: 3.9 mEq/L (ref 3.7–5.3)
Sodium: 137 mEq/L (ref 137–147)

## 2013-11-23 LAB — LIPID PANEL
CHOL/HDL RATIO: 2.9 ratio
Cholesterol: 149 mg/dL (ref 0–200)
HDL: 51 mg/dL (ref >39)
LDL Cholesterol: 84 mg/dL (ref 0–99)
Triglycerides: 72 mg/dL (ref <150)
VLDL: 14 mg/dL (ref 0–40)

## 2013-11-23 MED ORDER — ASPIRIN 81 MG PO CHEW
CHEWABLE_TABLET | ORAL | Status: AC
  Administered 2013-11-23: 81 mg
  Filled 2013-11-23: qty 1

## 2013-11-23 NOTE — Progress Notes (Signed)
Patient ID: Mercedes [name]  female  [id number]    DOB: 21-Sep-1955    DOA: 11/21/2013  PCP: Rene Paci, MD  Assessment/Plan: Principal Problem:   Cellulitis with the left great toe gangrene - ABIs done left 0.51, right 0.6 - Vascular surgery consulted, recommended aortogram with bilateral LE runoff 3/30 - Continue IV vancomycin and Zosyn - NPO after midnight for arteriogram 3/30  Active Problems:   HYPERLIPIDEMIA - Not on any statins outpatient - Will check lipid panel    HYPERTENSION - BP stable      PERIPHERAL VASCULAR DISEASE - Currently on aspirin    CAD  - Currently on aspirin  DVT Prophylaxis: Lovenox  Code Status: Full code  Family Communication:  Disposition:  Consultants:  Vascular surgery  Procedures:  None  Antibiotics:  Vancomycin 3/27  Zosyn 3/27  Subjective: Feels a whole lot better today with nausea vomiting but has poor appetite  Objective: Weight change:   Intake/Output Summary (Last 24 hours) at 11/23/13 1110 Last data filed at 11/22/13 1300  Gross per 24 hour  Intake    240 ml  Output      0 ml  Net    240 ml   Blood pressure 127/71, pulse 63, temperature 98.7 F (37.1 C), temperature source Oral, resp. rate 15, height 5\' 6"  (1.676 m), weight 53.978 kg (119 lb), SpO2 95.00%.  Physical Exam: General: Alert and awake, oriented x3  CVS: S1-S2 clear, no murmur rubs or gallops Chest: CTAB Abdomen: soft nontender, nondistended, normal bowel sounds  Extremities: no c/c. Gangrenous changes of the left first toe, inflamed    Lab Results: Basic Metabolic Panel:  Recent Labs Lab 11/22/13 0500 11/23/13 0428  NA 135* 137  K 4.2 3.9  CL 98 102  CO2 22 23  GLUCOSE 98 93  BUN 12 11  CREATININE 1.13* 1.04  CALCIUM 9.2 8.9   Liver Function Tests:  Recent Labs Lab 11/21/13 1222  AST 32  ALT 36*  ALKPHOS 105  BILITOT 0.3  PROT 7.7  ALBUMIN 3.7   No results found for this basename: LIPASE, AMYLASE,  in the  last 168 hours No results found for this basename: AMMONIA,  in the last 168 hours CBC:  Recent Labs Lab 11/21/13 1222 11/22/13 0500  WBC 6.5 5.4  NEUTROABS 4.2  --   HGB 14.3 12.8  HCT 42.3 39.5  MCV 80.4 80.8  PLT 353 305   Cardiac Enzymes: No results found for this basename: CKTOTAL, CKMB, CKMBINDEX, TROPONINI,  in the last 168 hours BNP: No components found with this basename: POCBNP,  CBG: No results found for this basename: GLUCAP,  in the last 168 hours   Micro Results: No results found for this or any previous visit (from the past 240 hour(s)).  Studies/Results: 11/24/13 Extrem Low Left Ltd  11/17/2013   Entered in error.   Dg Foot Complete Left  11/21/2013   CLINICAL DATA:  Great toe infection  EXAM: LEFT FOOT - COMPLETE 3+ VIEW  COMPARISON:  None.  FINDINGS: There is no acute fracture or dislocation. There is mild osseous resorption at the distal tuft of the first distal phalanx concerning for osteomyelitis. There is severe soft tissue swelling of the great toe. There is no subcutaneous emphysema. There is peripheral vascular atherosclerotic disease.  IMPRESSION: 1. Mild osseous resorption at the distal tuft of the first distal phalanx concerning for osteomyelitis given the overlying severe soft tissue swelling. Further evaluation with MRI would be  helpful.   Electronically Signed   By: Elige Ko   On: 11/21/2013 13:09   Dg Toe Great Left  11/07/2013   CLINICAL DATA:  Pain.  EXAM: LEFT GREAT TOE  COMPARISON:  None.  FINDINGS: Erosive changes are noted of the distal tuft of the left great toe. An erosive lesion including osteomyelitis could present in this fashion. There is adjacent soft tissue swelling. Degenerative changes are noted about the first metacarpal phalangeal and first interphalangeal joint. Subchondral cyst is noted in the distal aspect of the proximal phalanx medially. This is most likely degenerative. No radiopaque foreign bodies. Vascular calcification noted.   IMPRESSION: 1. Erosive changes noted distal tuft of the left great toe. Erosive process including osteomyelitis could present in this fashion. There is adjacent soft tissue swelling. No radiopaque foreign body.  2.  DJD.  3.  Peripheral vascular disease.   Electronically Signed   By: Maisie Fus  Register   On: 11/07/2013 13:44    Medications: Scheduled Meds: . aspirin  81 mg Oral Daily  . enoxaparin (LOVENOX) injection  40 mg Subcutaneous Q24H  . piperacillin-tazobactam (ZOSYN)  IV  3.375 g Intravenous 3 times per day  . sodium chloride  3 mL Intravenous Q12H  . vancomycin  500 mg Intravenous Q12H      LOS: 2 days   Kaydynce Pat M.D. [facility] 11/23/2013, 11:10 AM Pager: [phone]  If 7PM-7AM, please contact night-coverage [email or web address] Password [email or web address]

## 2013-11-24 ENCOUNTER — Ambulatory Visit: Payer: Medicare Other | Admitting: Family Medicine

## 2013-11-24 ENCOUNTER — Encounter (HOSPITAL_COMMUNITY)
Admission: EM | Disposition: A | Discharge status: Home / Self Care | Payer: Self-pay | Source: Home / Self Care | Attending: Internal Medicine

## 2013-11-24 HISTORY — PX: ABDOMINAL AORTAGRAM: SHX5454

## 2013-11-24 LAB — BASIC METABOLIC PANEL
BUN: 7 mg/dL (ref 6–23)
CALCIUM: 8.9 mg/dL (ref 8.4–10.5)
CHLORIDE: 104 meq/L (ref 96–112)
CO2: 23 mEq/L (ref 19–32)
Creatinine, Ser: 0.93 mg/dL (ref 0.50–1.10)
GFR calc Af Amer: 76 mL/min — ABNORMAL LOW (ref >90)
GFR calc non Af Amer: 66 mL/min — ABNORMAL LOW (ref >90)
Glucose, Bld: 83 mg/dL (ref 70–99)
Potassium: 4.1 mEq/L (ref 3.7–5.3)
SODIUM: 139 meq/L (ref 137–147)

## 2013-11-24 LAB — CBC
HCT: 37.3 % (ref 36.0–46.0)
Hemoglobin: 11.9 g/dL — ABNORMAL LOW (ref 12.0–15.0)
MCH: 26 pg (ref 26.0–34.0)
MCHC: 31.9 g/dL (ref 30.0–36.0)
MCV: 81.6 fL (ref 78.0–100.0)
PLATELETS: 251 10*3/uL (ref 150–400)
RBC: 4.57 MIL/uL (ref 3.87–5.11)
RDW: 14.4 % (ref 11.5–15.5)
WBC: 5.5 10*3/uL (ref 4.0–10.5)

## 2013-11-24 LAB — VANCOMYCIN, TROUGH: Vancomycin Tr: 8.1 ug/mL — ABNORMAL LOW (ref 10.0–20.0)

## 2013-11-24 SURGERY — ABDOMINAL AORTAGRAM
Anesthesia: LOCAL

## 2013-11-24 MED ORDER — CIPROFLOXACIN HCL 500 MG PO TABS
500.0000 mg | ORAL_TABLET | Freq: Two times a day (BID) | ORAL | Status: DC
  Administered 2013-11-24 – 2013-11-26 (×4): 500 mg via ORAL
  Filled 2013-11-24 (×6): qty 1

## 2013-11-24 MED ORDER — LIDOCAINE HCL (PF) 1 % IJ SOLN
INTRAMUSCULAR | Status: AC
  Filled 2013-11-24: qty 30

## 2013-11-24 MED ORDER — ASPIRIN 81 MG PO CHEW
81.0000 mg | CHEWABLE_TABLET | Freq: Every day | ORAL | Status: DC
  Administered 2013-11-24 – 2013-11-26 (×3): 81 mg via ORAL
  Filled 2013-11-24 (×2): qty 1

## 2013-11-24 MED ORDER — DOXYCYCLINE HYCLATE 100 MG PO TABS
100.0000 mg | ORAL_TABLET | Freq: Two times a day (BID) | ORAL | Status: DC
  Administered 2013-11-24 – 2013-11-26 (×4): 100 mg via ORAL
  Filled 2013-11-24 (×5): qty 1

## 2013-11-24 MED ORDER — SODIUM CHLORIDE 0.9 % IV SOLN: 1.0000 mL/kg/h | INTRAVENOUS | Status: AC

## 2013-11-24 MED ORDER — MIDAZOLAM HCL 2 MG/2ML IJ SOLN
INTRAMUSCULAR | Status: AC
  Filled 2013-11-24: qty 2

## 2013-11-24 MED ORDER — TICAGRELOR 90 MG PO TABS
90.0000 mg | ORAL_TABLET | Freq: Two times a day (BID) | ORAL | Status: DC
  Administered 2013-11-24 – 2013-11-25 (×2): 90 mg via ORAL
  Filled 2013-11-24 (×3): qty 1

## 2013-11-24 MED ORDER — FENTANYL CITRATE 0.05 MG/ML IJ SOLN
INTRAMUSCULAR | Status: AC
  Filled 2013-11-24: qty 2

## 2013-11-24 MED ORDER — HEPARIN (PORCINE) IN NACL 2-0.9 UNIT/ML-% IJ SOLN
INTRAMUSCULAR | Status: AC
  Filled 2013-11-24: qty 1000

## 2013-11-24 NOTE — H&P (View-Only) (Signed)
[facility] [place] HISTORY AND PHYSICAL   History of Present Illness:  Patient is a 58 y.o. year old female who presents for evaluation of pain and dark discoloration of left first toe.  Pt began having pain and color changes in that toe 4 months ago.  She has had slow progressive worsening.  She has chronic claudication left > right at 1 block which has been present at least 6 months.  She quit smoking 6 months ago.  No prior leg procedures.  She was admitted yesterday for pain control and IV antibiotics.  Other medical problems include cardiomyopathy, arthritis, reflux, hyperlipidemia, hypertension, coronary disease, lupus, Sjogren's all currently stable.  Hemoglobin A1C consistant with Diabetes.  Past Medical History  Diagnosis Date  . 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 PAD (peripheral artery disease)     eval by Dr. McAlhany in past but patient canceled angiogram  . RA (rheumatoid arthritis)     prior tx by [facility]  . GERD (gastroesophageal reflux disease)   . Dizziness     ? CVA 01/2010 - carotid dopplers with no ICA stenosis  . Arnold-Chiari malformation, type I     MRI brain 02/2010  . Bradycardia   . Tobacco abuse     quit 04/2013  . DEPRESSION   . HYPERLIPIDEMIA   . HYPERTENSION   . CAD (coronary artery disease)     a. NSTEMI 07/2009: LHC - D2 40%, LAD irreg., EF 50% with apical AK (tako-tsubo CM);  b. Inf STEMI (04/2013): Tx Promus DES to CFX;  c. 08/2013 Lexi CL: No ischemia, dist ant/ap/inf-ap infarct, EF   . SLE (systemic lupus erythematosus) dx 05/2013  . Sjogren's syndrome 06/02/13  . Abnormal LFTs     a. 08/2013 Lipitor 40 d/c'd and crestor 10 started - f/u lft's scheduled for 10/01/2013.    Past Surgical History  Procedure Laterality Date  . None    . Coronary angioplasty    . Tubal ligation      Social History History  Substance Use Topics  . Smoking status: Former Smoker     Types: Cigarettes    Quit date: 05/19/2013  . Smokeless tobacco: Former User    Quit date: 05/19/2013     Comment:    . Alcohol Use: Yes     Comment: i DONT DRINK ANYMORE"    Family History Family History  Problem Relation Age of Onset  . Arthritis Mother   . Arthritis Father   . Diabetes Other     Allergies  No Known Allergies   Current Facility-Administered Medications  Medication Dose Route Frequency Provider Last Rate Last Dose  . 0.9 %  sodium chloride infusion   Intravenous Continuous Ripudeep K Rai, MD 100 mL/hr at 11/22/13 0809    . aspirin tablet 81 mg  81 mg Oral Daily Iskra M Myers, MD      . enoxaparin (LOVENOX) injection 40 mg  40 mg Subcutaneous Q24H Iskra M Myers, MD   40 mg at 11/21/13 2045  . HYDROcodone-acetaminophen (NORCO/VICODIN) 5-325 MG per tablet 1-2 tablet  1-2 tablet Oral Q4H PRN Iskra M Myers, MD   2 tablet at 11/22/13 0136  . HYDROmorphone (DILAUDID) injection 0.5 mg  0.5 mg Intravenous Q2H PRN Iskra M Myers, MD   0.5 mg at 11/22/13 0549  . ondansetron (ZOFRAN) injection 4 mg  4 mg Intravenous Q4H PRN Ripudeep   Jenna Luo, MD      . piperacillin-tazobactam (ZOSYN) IVPB 3.375 g  3.375 g Intravenous 3 times per day Herby Abraham, RPH   3.375 g at 11/22/13 0546  . sodium chloride 0.9 % injection 3 mL  3 mL Intravenous Q12H Dorothea Ogle, MD      . vancomycin (VANCOCIN) 500 mg in sodium chloride 0.9 % 100 mL IVPB  500 mg Intravenous Q12H Dorothea Ogle, MD   500 mg at 11/21/13 2044    ROS:   General:  No weight loss, Fever, chills  HEENT: No recent headaches, no nasal bleeding, no visual changes, no sore throat  Neurologic: No dizziness, blackouts, seizures. No recent symptoms of stroke or mini- stroke. No recent episodes of slurred speech, or temporary blindness.  Cardiac: No recent episodes of chest pain/pressure, no shortness of breath at rest.  No shortness of breath with exertion.  Denies history of atrial fibrillation or irregular  heartbeat  Vascular: + history of rest pain in feet.  + history of claudication.  + history of non-healing ulcer, No history of DVT   Pulmonary: No home oxygen, no productive cough, no hemoptysis,  No asthma or wheezing  Musculoskeletal:  [ ]  Arthritis, [ ]  Low back pain,  [x Joint pain  Hematologic:No history of hypercoagulable state.  No history of easy bleeding.  No history of anemia  Gastrointestinal: No hematochezia or melena,  No gastroesophageal reflux, no trouble swallowing  Urinary: [ ]  chronic Kidney disease, [ ]  on HD - [ ]  MWF or [ ]  TTHS, [ ]  Burning with urination, [ ]  Frequent urination, [ ]  Difficulty urinating;   Skin: No rashes  Psychological: No history of anxiety,  No history of depression   Physical Examination  Filed Vitals:   11/21/13 1445 11/21/13 1731 11/21/13 2109 11/22/13 0603  BP: 131/72 107/51 120/60 98/52  Pulse: 47 57 53 64  Temp:  97.8 F (36.6 C) 98.6 F (37 C) 98.9 F (37.2 C)  TempSrc:  Oral Oral Oral  Resp:  20 18   Height:  5\' 6"  (1.676 m)    Weight:  119 lb (53.978 kg)    SpO2: 97% 99% 96% 100%    Body mass index is 19.22 kg/(m^2).  General:  Alert and oriented, no acute distress HEENT: Normal Neck: No bruit or JVD Pulmonary: Clear to auscultation bilaterally Cardiac: Regular Rate and Rhythm Abdomen: Soft, non-tender, non-distended, no mass Skin: No rash, early gangrenous changes left first toe, no erythema no abscess no drainage Extremity Pulses:  2+ radial, brachial, femoral, absent popliteal dorsalis pedis, posterior tibial pulses bilaterally Musculoskeletal: No deformity or edema  Neurologic: Upper and lower extremity motor 5/5 and symmetric  DATA:  ABI 0.6 Right, 0.5 left yesterday   ASSESSMENT:  Gangrene left first toe with claudication and probably rest pain.  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  Diabetes via Hgb A1C per primary team    PLAN:  Aortogram with bilateral lower extremity runoff  possible intervention on Monday 11/24/13 NPO p midnight Sunday consent  , MD [facility] [place] Office: [phone] Pager: [phone]

## 2013-11-24 NOTE — Progress Notes (Signed)
Arteriogram reviewed.  Pt needs left femoral popliteal bypass and amputation of toe.  This is scheduled for next Tuesday April 7.  Procedure details risk benefit discussed with pt.  Can be dc'd home from my standpoint on oral antibiotics.  Fabienne Bruns, MD [facility] [place] Office: [phone] Pager: [phone]

## 2013-11-24 NOTE — Progress Notes (Signed)
Pharmacy: Vancomycin and Zosyn  57yof continues on day #4 vancomycin and zosyn for left great toe osteomyelitis. Renal function has remained stable.  Vancomycin 3/27>> Zosyn 3/27>> No cultures  Plan: 1) Continue vancomycin 500mg  IV q12 - check trough tonight prior to 2100 dose 2) Continue zosyn 3.375g IV q8 (4 hour infusion)  , PharmD, BCPS 11/24/13, 1:36 PM

## 2013-11-24 NOTE — Op Note (Signed)
   PATIENT: Mercedes [name]  MRN: [id number] DOB: 1956/07/30    DATE OF PROCEDURE: 11/24/2013  INDICATIONS: Mercedes [name] is a 58 y.o. female Presents with ischemic left foot. She comes in for diagnostic arteriography  PROCEDURE:  1. Ultrasound-guided access to the right common femoral artery 2. Aortogram with bilateral iliac arteriogram 3. Selective catheterization of the left external iliac artery with left lower extremity runoff 4. Retrograde right femoral  SURGEON: Di Kindle. Edilia Bo, MD, FACS  ANESTHESIA: local with sedation   EBL: minimal  TECHNIQUE: The patient was taken to the peripheral vascular lab and received 1 mg of Versed and 50 mcg of fentanyl. Both groins were prepped and draped in usual sterile fashion. Under ultrasound guidance, after the skin was anesthetized, the right common femoral artery was cannulated and a guidewire introduced into the infrarenal aorta under fluoroscopic control. A 5 French sheath was introduced over the wire. A pigtail catheter was positioned at the L1 vertebral body a flush aortogram obtained. Cath was positioned above the aortic bifurcation and oblique iliac projections were obtained. Bilateral lower extremity runoff films were obtained. I then exchanged the pigtail catheter for a crossover catheter which was positioned into the proximal left common iliac artery. The wire was advanced down into the external iliac artery and then a crossover catheter exchange for an endhole catheter. Selective left external iliac arteriogram was obtained left lower extremity runoff. Next the end hole cath was removed and then a right femoral arteriogram was obtained with spot films of the right leg. The completion of the procedure, the patient was transferred to the holding area for removal of the sheath.  FINDINGS:  1. Their single renal arteries bilaterally with no significant renal artery stenosis identified. The infrarenal aorta is widely patent as are  bilateral common iliac arteries and external iliac arteries although these are small. The left hypogastric artery is occluded. 2. On the right side, there is a mild disease in the common femoral artery but no focal stenosis. Deep femoral artery is patent. The superficial femoral artery has moderate diffuse disease proximally and is occluded in the proximal thigh. The above-knee pop 2 artery reconstitutes and is single vessel runoff on the right via the peroneal artery. The anterior tibial and posterior tibial arteries are occluded. 3. On the left side, which is the symptomatic side, there is diffuse disease in the proximal superficial femoral artery and then the superficial femoral arteries occluded in the proximal thigh with reconstitution of the above-knee popliteal artery. There is single vessel runoff on the left via the peroneal artery.  The anterior tibial and posterior tibial arteries are occluded. A short segment of the dorsalis pedis at the ankle is patent but then is occluded beyond that.  Waverly Ferrari, MD, FACS [facility]  DATE OF DICTATION:   11/24/2013

## 2013-11-24 NOTE — Interval H&P Note (Signed)
History and Physical Interval Note:  11/24/2013 10:04 AM  Mercedes [name]  has presented today for surgery, with the diagnosis of claudication  The various methods of treatment have been discussed with the patient and family. After consideration of risks, benefits and other options for treatment, the patient has consented to  Procedure(s): ABDOMINAL AORTAGRAM WITH BILATERAL RUNOFF WITH POSSIBLE INTERVENTION (N/A) as a surgical intervention .  The patient's history has been reviewed, patient examined, no change in status, stable for surgery.  I have reviewed the patient's chart and labs.  Questions were answered to the patient's satisfaction.     DICKSON,CHRISTOPHER S

## 2013-11-24 NOTE — Progress Notes (Signed)
UR Completed Petra Sargeant Graves-Bigelow, RN,BSN [phone]  

## 2013-11-24 NOTE — ED Provider Notes (Signed)
Medical screening examination/treatment/procedure(s) were conducted as a shared visit with non-physician practitioner(s) and myself.  I personally evaluated the patient during the encounter.   EKG Interpretation None      Pt c/o infection to left great toe, prior outpt tx for same incl partial nail excision, po abx. Purulent drainage to area. Lab. Xr. Bolivar Haw, MD 11/24/13 [phone]

## 2013-11-24 NOTE — Progress Notes (Signed)
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 Placed on oral doxycycline and ciprofloxacin. Patient had difficult time with antibiotics and had significant nausea and vomiting hence we will make sure that patient tolerates oral antibiotics prior to DC.  Active Problems:   HYPERLIPIDEMIA - LDL 84    HYPERTENSION - BP stable      PERIPHERAL VASCULAR DISEASE - Currently on aspirin    CAD  - Currently on aspirin  DVT Prophylaxis: Lovenox  Code Status: Full code  Family Communication:  Disposition: DC home in a.m. if tolerating oral antibiotics  Consultants:  Vascular surgery  Procedures: aortogram with bilateral LE runoff 3/30   Antibiotics:  Vancomycin 3/27  Zosyn 3/27  Subjective: Patient seen and examined, was awaiting procedure at the time, No complaints   Objective: Weight change:   Intake/Output Summary (Last 24 hours) at 11/24/13 1631 Last data filed at 11/24/13 0522  Gross per 24 hour  Intake    360 ml  Output    700 ml  Net   -340 ml   Blood pressure 119/65, pulse 53, temperature 98.3 F (36.8 C), temperature source Oral, resp. rate 16, height 5\' 6"  (1.676 m), weight 53.978 kg (119 lb), SpO2 97.00%.  Physical Exam: General: Alert and awake, oriented x3  CVS: S1-S2 clear, no murmur rubs or gallops Chest: CTAB Abdomen: soft nontender, nondistended, normal bowel sounds  Extremities: no c/c. Gangrenous changes of the left  first toe, inflamed    Lab Results: Basic Metabolic Panel:  Recent Labs Lab 11/23/13 0428 11/24/13 0525  NA 137 139  K 3.9 4.1  CL 102 104  CO2 23 23  GLUCOSE 93 83  BUN 11 7  CREATININE 1.04 0.93  CALCIUM 8.9 8.9   Liver Function Tests:  Recent Labs Lab 11/21/13 1222  AST 32  ALT 36*  ALKPHOS 105  BILITOT 0.3  PROT 7.7  ALBUMIN 3.7   No results found for this basename: LIPASE, AMYLASE,  in the last 168 hours No results found for this basename: AMMONIA,  in the last 168 hours CBC:  Recent Labs Lab 11/21/13 1222 11/22/13 0500 11/24/13 0525  WBC 6.5 5.4 5.5  NEUTROABS 4.2  --   --   HGB 14.3 12.8 11.9*  HCT 42.3 39.5 37.3  MCV 80.4 80.8 81.6  PLT 353 305 251   Cardiac Enzymes: No results found for this basename: CKTOTAL, CKMB, CKMBINDEX, TROPONINI,  in the last 168 hours BNP: No components found with this basename: POCBNP,  CBG: No results found for this basename: GLUCAP,  in the last 168 hours   Micro Results: No results found for this or any previous visit (from the past 240 hour(s)).  Studies/Results: 11/26/13 Extrem Low Left Ltd  11/17/2013   Entered in error.   Dg Foot Complete Left  11/21/2013   CLINICAL DATA:  Great toe infection  EXAM: LEFT FOOT - COMPLETE 3+ VIEW  COMPARISON:  None.  FINDINGS: There is no acute fracture or dislocation.  There is mild osseous resorption at the distal tuft of the first distal phalanx concerning for osteomyelitis. There is severe soft tissue swelling of the great toe. There is no subcutaneous emphysema. There is peripheral vascular atherosclerotic disease.  IMPRESSION: 1. Mild osseous resorption at the distal tuft of the first distal phalanx concerning for osteomyelitis given the overlying severe soft tissue swelling. Further evaluation with MRI would be helpful.   Electronically Signed   By: Elige Ko   On: 11/21/2013 13:09   Dg Toe Great Left  11/07/2013   CLINICAL DATA:  Pain.  EXAM: LEFT GREAT TOE  COMPARISON:   None.  FINDINGS: Erosive changes are noted of the distal tuft of the left great toe. An erosive lesion including osteomyelitis could present in this fashion. There is adjacent soft tissue swelling. Degenerative changes are noted about the first metacarpal phalangeal and first interphalangeal joint. Subchondral cyst is noted in the distal aspect of the proximal phalanx medially. This is most likely degenerative. No radiopaque foreign bodies. Vascular calcification noted.  IMPRESSION: 1. Erosive changes noted distal tuft of the left great toe. Erosive process including osteomyelitis could present in this fashion. There is adjacent soft tissue swelling. No radiopaque foreign body.  2.  DJD.  3.  Peripheral vascular disease.   Electronically Signed   By: Maisie Fus  Register   On: 11/07/2013 13:44    Medications: Scheduled Meds: . aspirin  81 mg Oral Daily  . ciprofloxacin  500 mg Oral BID  . doxycycline  100 mg Oral Q12H  . enoxaparin (LOVENOX) injection  40 mg Subcutaneous Q24H  . sodium chloride  3 mL Intravenous Q12H  . Ticagrelor  90 mg Oral BID      LOS: 3 days   Terianna Peggs M.D. [facility] 11/24/2013, 4:31 PM Pager: [phone]  If 7PM-7AM, please contact night-coverage [email or web address] Password [email or web address]

## 2013-11-25 ENCOUNTER — Inpatient Hospital Stay (HOSPITAL_COMMUNITY): Payer: Medicare Other

## 2013-11-25 DIAGNOSIS — Q054 Unspecified spina bifida with hydrocephalus: Secondary | ICD-10-CM

## 2013-11-25 DIAGNOSIS — I2510 Atherosclerotic heart disease of native coronary artery without angina pectoris: Secondary | ICD-10-CM

## 2013-11-25 LAB — BASIC METABOLIC PANEL
BUN: 8 mg/dL (ref 6–23)
CALCIUM: 8.9 mg/dL (ref 8.4–10.5)
CO2: 23 mEq/L (ref 19–32)
CREATININE: 0.76 mg/dL (ref 0.50–1.10)
Chloride: 105 mEq/L (ref 96–112)
GLUCOSE: 93 mg/dL (ref 70–99)
Potassium: 4.1 mEq/L (ref 3.7–5.3)
Sodium: 139 mEq/L (ref 137–147)

## 2013-11-25 MED ORDER — CLOPIDOGREL BISULFATE 75 MG PO TABS: 75.0000 mg | ORAL_TABLET | Freq: Every day | ORAL | Status: DC

## 2013-11-25 MED ORDER — MORPHINE SULFATE 2 MG/ML IJ SOLN
2.0000 mg | Freq: Once | INTRAMUSCULAR | Status: DC
  Filled 2013-11-25: qty 1

## 2013-11-25 MED ORDER — PROMETHAZINE HCL 12.5 MG PO TABS: 12.5000 mg | ORAL_TABLET | Freq: Four times a day (QID) | ORAL | Status: DC | PRN

## 2013-11-25 MED ORDER — CLOPIDOGREL BISULFATE 75 MG PO TABS
75.0000 mg | ORAL_TABLET | Freq: Every day | ORAL | Status: DC
  Administered 2013-11-26: 75 mg via ORAL
  Filled 2013-11-25: qty 1

## 2013-11-25 MED ORDER — ATORVASTATIN CALCIUM 20 MG PO TABS
20.0000 mg | ORAL_TABLET | Freq: Every day | ORAL | Status: DC
  Filled 2013-11-25 (×2): qty 1

## 2013-11-25 MED ORDER — ALBUTEROL SULFATE (2.5 MG/3ML) 0.083% IN NEBU: 2.5000 mg | INHALATION_SOLUTION | Freq: Four times a day (QID) | RESPIRATORY_TRACT | Status: DC | PRN

## 2013-11-25 MED ORDER — HYDRALAZINE HCL 20 MG/ML IJ SOLN
10.0000 mg | Freq: Four times a day (QID) | INTRAMUSCULAR | Status: DC | PRN
  Administered 2013-11-25: 10 mg via INTRAVENOUS
  Filled 2013-11-25: qty 1

## 2013-11-25 MED ORDER — HYDROCODONE-ACETAMINOPHEN 5-325 MG PO TABS: 1.0000 | ORAL_TABLET | Freq: Four times a day (QID) | ORAL | Status: DC | PRN

## 2013-11-25 MED ORDER — CIPROFLOXACIN HCL 500 MG PO TABS: 500.0000 mg | ORAL_TABLET | Freq: Two times a day (BID) | ORAL | Status: DC

## 2013-11-25 MED ORDER — HYDROXYCHLOROQUINE SULFATE 200 MG PO TABS
200.0000 mg | ORAL_TABLET | Freq: Every day | ORAL | Status: DC
  Administered 2013-11-25 – 2013-11-26 (×2): 200 mg via ORAL
  Filled 2013-11-25 (×2): qty 1

## 2013-11-25 MED ORDER — DOXYCYCLINE HYCLATE 100 MG PO TABS: 100.0000 mg | ORAL_TABLET | Freq: Two times a day (BID) | ORAL | Status: DC

## 2013-11-25 MED ORDER — LISINOPRIL 20 MG PO TABS
20.0000 mg | ORAL_TABLET | Freq: Every day | ORAL | Status: DC
  Administered 2013-11-25 – 2013-11-26 (×2): 20 mg via ORAL
  Filled 2013-11-25 (×2): qty 1

## 2013-11-25 MED ORDER — ALBUTEROL SULFATE (2.5 MG/3ML) 0.083% IN NEBU
INHALATION_SOLUTION | RESPIRATORY_TRACT | Status: AC
  Filled 2013-11-25: qty 3

## 2013-11-25 NOTE — Progress Notes (Addendum)
Patient complained of shortness of breath and headache.  I notified Dr. Isidoro Donning, she ordered 2 mg iv morphine and a portable chest xray.

## 2013-11-25 NOTE — Progress Notes (Signed)
Patient refused IV morphine, Dr. Isidoro Donning notified

## 2013-11-25 NOTE — Care Management Note (Signed)
    Page 1 of 1   11/25/2013     9:52:56 AM   CARE MANAGEMENT NOTE 11/25/2013  Patient:  Mercedes [name], Mercedes [name]   Account Number:  [id number]  Date Initiated:  11/25/2013  Documentation initiated by:  GRAVES-BIGELOW,Doreen Garretson  Subjective/Objective Assessment:   Pt admitted for left great toe pain.  Cellulitis with the left great toe gangrene. Initiated on IV ABX.  Vascular surgery consulted, recommended aortogram with bilateral LE runoff 3/30.     Action/Plan:   No needs from CM at this time. Plan will be for d/c home when medically stable.   Anticipated DC Date:  11/25/2013   Anticipated DC Plan:  HOME/SELF CARE      DC Planning Services  CM consult      Choice offered to / List presented to:             Status of service:  Completed, signed off Medicare Important Message given?   (If response is "NO", the following Medicare IM given date fields will be blank) Date Medicare IM given:   Date Additional Medicare IM given:    Discharge Disposition:  HOME/SELF CARE  Per UR Regulation:  Reviewed for med. necessity/level of care/duration of stay  If discussed at Long Length of Stay Meetings, dates discussed:    Comments:

## 2013-11-25 NOTE — Progress Notes (Signed)
Called to bedside for prn Albuterol treatment.  Upon arrival to room, patient denies any need for treatment.  BBS Clear through out.  Advised patient about indications for the medication.  She demonstrated understanding, and feels her SOB is a side effect of her other medicines.  Advised patient to call if the SOB did not resolve, as per her norm, and I would return to give the breathing treatment.

## 2013-11-25 NOTE — Progress Notes (Signed)
Attempted to give patient her 1800 dose of Lipitor, patient refused and stated she wasn't taking anymore medications today.

## 2013-11-25 NOTE — Consult Note (Signed)
CONSULT NOTE  Date: 11/25/2013               Patient Name:  Mercedes [name] MRN: [id number]  DOB: [birth year] Age / Sex: 58 y.o., female        PCP: Rene Paci Primary Cardiologist: Jens Som            Referring Physician: Rod Can              Reason for Consult: Adjustment of meds (brilinta)            History of Present Illness: Patient is a 58 y.o. female with a PMHx of coronary artery disease-status post stenting of her left circumflex artery, hypertension, hyperlipidemia, peripheral vascular disease, Sjogren's syndrome and a remote history of smoking, who was admitted to [facility] on 11/21/2013 for evaluation of  infected foot ulcer.  The patient had a circumflex stent placed on 05/19/2013. The stent was a 3.5 x 16 mm Promus Premier stent. Was post dilated using a 3.75 mm noncompliant balloon and there was 0% residual stenosis at the end of the case. Dr. Excell Seltzer I recommended ASA and Brilinta for a minimum of 1 year.   The patient was admitted with osteomyelitis of the left great toe. An angiogram performed on 11/24/2013. She needs to have left femoropopliteal bypass with amputation of the toe which is been scheduled for April 7.  The surgeons have recommended that we discontinue the Brilinta prior to her foot surgery.      In talking with the patient, it is clear that she actually has not been tolerating the Brilinta there will. It seems to cause her some shortness of breath. She is typically been taking it only once a day.  She has never been tried on plavix as far as I can tell.    Medications: Outpatient medications: Prescriptions prior to admission  Medication Sig Dispense Refill  . aspirin 81 MG tablet Take 1 tablet (81 mg total) by mouth daily.  30 tablet    . atorvastatin (LIPITOR) 40 MG tablet Take 40 mg by mouth daily.      . hydroxychloroquine (PLAQUENIL) 200 MG tablet Take 200 mg by mouth daily.      [name] lisinopril (PRINIVIL,ZESTRIL) 20 MG tablet Take 1 tablet  (20 mg total) by mouth daily.  90 tablet  3  . nitroGLYCERIN (NITROSTAT) 0.4 MG SL tablet Place 0.4 mg under the tongue every 5 (five) minutes as needed for chest pain.      . Ticagrelor (BRILINTA) 90 MG TABS tablet Take 90 mg by mouth 2 (two) times daily.      . [DISCONTINUED] cephALEXin (KEFLEX) 500 MG capsule Take 1 capsule (500 mg total) by mouth 2 (two) times daily.  20 capsule  0    Current medications: Current Facility-Administered Medications  Medication Dose Route Frequency Provider Last Rate Last Dose  . aspirin chewable tablet 81 mg  81 mg Oral Daily Ripudeep K Rai, MD   81 mg at 11/25/13 1025  . atorvastatin (LIPITOR) tablet 20 mg  20 mg Oral q1800 Ripudeep K Rai, MD      . ciprofloxacin (CIPRO) tablet 500 mg  500 mg Oral BID Ripudeep Jenna Luo, MD   500 mg at 11/25/13 0827  . doxycycline (VIBRA-TABS) tablet 100 mg  100 mg Oral Q12H Ripudeep K Rai, MD   100 mg at 11/25/13 1027  . enoxaparin (LOVENOX) injection 40 mg  40 mg Subcutaneous Q24H Dorothea Ogle,  MD   40 mg at 11/23/13 2259  . HYDROcodone-acetaminophen (NORCO/VICODIN) 5-325 MG per tablet 1-2 tablet  1-2 tablet Oral Q4H PRN Dorothea Ogle, MD   2 tablet at 11/25/13 0407  . HYDROmorphone (DILAUDID) injection 0.5 mg  0.5 mg Intravenous Q2H PRN Dorothea Ogle, MD   0.5 mg at 11/23/13 1107  . hydroxychloroquine (PLAQUENIL) tablet 200 mg  200 mg Oral Daily Ripudeep K Rai, MD   200 mg at 11/25/13 1137  . lisinopril (PRINIVIL,ZESTRIL) tablet 20 mg  20 mg Oral Daily Ripudeep Jenna Luo, MD   20 mg at 11/25/13 1138  . morphine 2 MG/ML injection 2 mg  2 mg Intravenous Once Ripudeep K Rai, MD      . ondansetron (ZOFRAN) injection 4 mg  4 mg Intravenous Q4H PRN Ripudeep Jenna Luo, MD   4 mg at 11/23/13 2310  . sodium chloride 0.9 % injection 3 mL  3 mL Intravenous Q12H Dorothea Ogle, MD      . Ticagrelor [facility]) tablet 90 mg  90 mg Oral BID Chuck Hint, MD   90 mg at 11/25/13 1028     No Known Allergies   Past Medical History    Diagnosis Date  . 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 PAD (peripheral artery disease)     eval by Dr. [name] in past but patient canceled angiogram  . RA (rheumatoid arthritis)     prior tx by [name]  . GERD (gastroesophageal reflux disease)   . Dizziness     ? CVA 01/2010 - carotid dopplers with no ICA stenosis  . Arnold-Chiari malformation, type I     MRI brain 02/2010  . Bradycardia   . Tobacco abuse     quit 04/2013  . DEPRESSION   . HYPERLIPIDEMIA   . HYPERTENSION   . CAD (coronary artery disease)     a. NSTEMI 07/2009: LHC - D2 40%, LAD irreg., EF 50% with apical AK (tako-tsubo CM);  b. Inf STEMI (04/2013): Tx Promus DES to CFX;  c. 08/2013 Lexi CL: No ischemia, dist ant/ap/inf-ap infarct, EF   . SLE (systemic lupus erythematosus) dx 05/2013  . Sjogren's syndrome 06/02/13  . Abnormal LFTs     a. 08/2013 Lipitor 40 d/c'd and crestor 10 started - f/u lft's scheduled for 10/01/2013.    Past Surgical History  Procedure Laterality Date  . None    . Coronary angioplasty    . Tubal ligation      Family History  Problem Relation Age of Onset  . Arthritis Mother   . Arthritis Father   . Diabetes Other     Social History:  reports that she quit smoking about 6 months ago. Her smoking use included Cigarettes. She smoked 0.00 packs per day. She quit smokeless tobacco use about 6 months ago. She reports that she drinks alcohol. She reports that she does not use illicit drugs.   Review of Systems: Constitutional:  denies fever, chills, diaphoresis, appetite change and fatigue.  HEENT: denies photophobia, eye pain, redness, hearing loss, ear pain, congestion, sore throat, rhinorrhea, sneezing, neck pain, neck stiffness and tinnitus.  Respiratory: denies SOB, DOE, cough, chest tightness, and wheezing.  Cardiovascular: denies chest pain, palpitations and leg swelling.  Gastrointestinal: denies nausea, vomiting,  abdominal pain, diarrhea, constipation, blood in stool.  Genitourinary: denies dysuria, urgency, frequency, hematuria, flank pain and difficulty urinating.  Musculoskeletal: denies  myalgias, back pain, joint swelling, arthralgias and gait problem.   Skin: denies pallor, rash and wound.  Neurological: denies dizziness, seizures, syncope, weakness, light-headedness, numbness and headaches.   Hematological: denies adenopathy, easy bruising, personal or family bleeding history.  Psychiatric/ Behavioral: denies suicidal ideation, mood changes, confusion, nervousness, sleep disturbance and agitation.    Physical Exam: BP 163/82  Pulse 55  Temp(Src) 97.8 F (36.6 C) (Oral)  Resp 18  Ht 5\' 6"  (1.676 m)  Wt 119 lb (53.978 kg)  BMI 19.22 kg/m2  SpO2 100%  Wt Readings from Last 3 Encounters:  11/21/13 119 lb (53.978 kg)  11/21/13 119 lb (53.978 kg)  11/12/13 117 lb (53.071 kg)    General: Vital signs reviewed and noted. Well-developed, well-nourished, in no acute distress; alert,   Head: Normocephalic, atraumatic, sclera anicteric,   Neck: Supple. Negative for carotid bruits. No JVD   Lungs:  Clear bilaterally, no  wheezes, rales, or rhonchi. Breathing is normal   Heart: RRR with S1 S2. No murmurs, rubs, or gallops   Abdomen:  Soft, non-tender, non-distended with normoactive bowel sounds. No hepatomegaly. No rebound/guarding. No obvious abdominal masses   MSK: Strength and the appear normal for age.   Extremities: The right great toe is necrotic  Neurologic: Alert and oriented X 3. Moves all extremities spontaneously.  Psych: Responds to questions appropriately with a normal affect.     Lab results: Basic Metabolic Panel:  Recent Labs Lab 11/23/13 0428 11/24/13 0525 11/25/13 0538  NA 137 139 139  K 3.9 4.1 4.1  CL 102 104 105  CO2 23 23 23   GLUCOSE 93 83 93  BUN 11 7 8   CREATININE 1.04 0.93 0.76  CALCIUM 8.9 8.9 8.9    Liver Function Tests:  Recent Labs Lab  11/21/13 1222  AST 32  ALT 36*  ALKPHOS 105  BILITOT 0.3  PROT 7.7  ALBUMIN 3.7   No results found for this basename: LIPASE, AMYLASE,  in the last 168 hours No results found for this basename: AMMONIA,  in the last 168 hours  CBC:  Recent Labs Lab 11/21/13 1222 11/22/13 0500 11/24/13 0525  WBC 6.5 5.4 5.5  NEUTROABS 4.2  --   --   HGB 14.3 12.8 11.9*  HCT 42.3 39.5 37.3  MCV 80.4 80.8 81.6  PLT 353 305 251    Cardiac Enzymes: No results found for this basename: CKTOTAL, CKMB, CKMBINDEX, TROPONINI,  in the last 168 hours  BNP: No components found with this basename: POCBNP,   CBG: No results found for this basename: GLUCAP,  in the last 168 hours  Coagulation Studies: No results found for this basename: LABPROT, INR,  in the last 72 hours   Other results: EKG :  NSR  Imaging:  No results found.   Assessment & Plan:  1. Coronary artery disease: The patient had a day and placed in September, 2014. Dr. 11/24/13 was able to achieve a very nice result and the procedure was fairly uncomplicated.  She was started Brilinta  and aspirin but actually has not been tolerating the Brilinta so she has been taking it only once a day.  I think that we could just as easily start her on Plavix 75 mg a day. She'll continue the aspirin.  In talking to Dr. 11/26/13, Dr. 01[birth year]1987 can do the surgery ON plavix.    She is not having any episodes of chest pain or shortness breath and really does not have any cardiac concerns.  She will return to see Dr. Jens Som in the office at her regularly  scheduled appointment.     Vesta Mixer, Montez Hageman., MD, [facility] 11/25/2013, 1:04 PM Office - [phone] Pager [phone]

## 2013-11-25 NOTE — Progress Notes (Signed)
Patient stated that Brilinta gives her shortness of breath and that she had "backed off on taking it" at home, patient elaborated on "backed off" by stating she would take Brilinta one time per day due to the shortness of breath.  I notified Dr. Isidoro Donning.

## 2013-11-25 NOTE — Progress Notes (Signed)
Orthopedic Tech Progress Note Patient Details:  Mercedes [name] [birth year] [id number]  Ortho Devices Type of Ortho Device: Postop shoe/boot Ortho Device/Splint Interventions: Application   [name] 11/25/2013, 1:43 PM

## 2013-11-25 NOTE — Progress Notes (Signed)
Patient ID: Mercedes [name]  female  JQZ:[id number]    DOB: 04/22/1956    DOA: 11/21/2013  PCP: Rene Paci, MD  Assessment/Plan: Principal Problem:   Cellulitis with the left great toe gangrene - ABIs done left 0.51, right 0.6 - Vascular surgery consulted, aortogram with bilateral LE runoff 3/30 today showed diffuse disease in the proximal SFA and superficial femoral arteries occluded and proximal thigh, anterior tibial and posterior tibials are occluded, recommended femoropopliteal bypass and amputation of toe on April 7 - Placed on oral doxycycline and ciprofloxacin. Patient had difficult time with antibiotics and had significant nausea and vomiting hence we will make sure that patient tolerates oral antibiotics prior to DC. - Patient has left femoropopliteal bypass and possible amputation of the left great toe on Tuesday for 12/02/13. Upon my discussion with Dr. Darrick Penna, patient can continue taking Plavix prior to surgery.  Acute dyspnea: Possibly due to brilinta, acute anxiety, leading to uncontrolled hypertension - Discussed with cardiology, patient has not been tolerating Brilinta well, she was only taking it once a day when she was recommended twice a day with aspirin after her stent placement for the acute MI in September 2014. - Brilinta was restarted after arteriogram yesterday. Cardiology consult was obtained and Dr. Elease Hashimoto recommended changing to plavix. Hence Brilinta now discontinued. - Chest x-ray obtained which showed hyperinflation otherwise no pneumonia or CHF - Patient was recommended breathing treatment, subsequently she refused  Active Problems:   HYPERLIPIDEMIA - LDL 84    HYPERTENSION uncontrolled due to acute dyspnea and anxiety issues - Placed on IV hydralazine as needed -Continue lisinopril     PERIPHERAL VASCULAR DISEASE - Currently on aspirin, now placed on Plavix    CAD  - Currently on aspirin, lisinopril, statins - Patient had stent placement in  September 2014 and was prescribed brilinta and aspirin for one year. Patient however was not taking Brilinta twice a day as it was causing her shortness of breath. -Cardiology okay with the changing to Plavix, started today -    DVT Prophylaxis: Lovenox  Code Status: Full code  Family Communication:  Disposition: DC home in a.m.  Consultants:  Vascular surgery  Cardiology  Procedures: aortogram with bilateral LE runoff 3/30   Antibiotics:  Vancomycin 3/27  Zosyn 3/27  Subjective: Patient seen and examined today twice, initially patient was upset that her outpatient medications were not started (explained that they were not on PTA meds) although Brilinta was started right after the procedure. Subsequently patient started complaining of acute shortness of breath, she was reexamined, no acute wheezing.  Objective: Weight change:   Intake/Output Summary (Last 24 hours) at 11/25/13 1415 Last data filed at 11/25/13 1100  Gross per 24 hour  Intake    840 ml  Output   1600 ml  Net   -760 ml   Blood pressure 220/67, pulse 56, temperature 98.1 F (36.7 C), temperature source Oral, resp. rate 18, height 5\' 6"  (1.676 m), weight 53.978 kg (119 lb), SpO2 100.00%.  Physical Exam: General: Alert and awake, oriented x3  CVS: S1-S2 clear, no murmur rubs or gallops Chest: CTAB Abdomen: soft nontender, nondistended, normal bowel sounds  Extremities: no c/c. Gangrenous changes of the left first toe, inflamed    Lab Results: Basic Metabolic Panel:  Recent Labs Lab 11/24/13 0525 11/25/13 0538  NA 139 139  K 4.1 4.1  CL 104 105  CO2 23 23  GLUCOSE 83 93  BUN 7 8  CREATININE 0.93 0.76  CALCIUM 8.9 8.9   Liver Function Tests:  Recent Labs Lab 11/21/13 1222  AST 32  ALT 36*  ALKPHOS 105  BILITOT 0.3  PROT 7.7  ALBUMIN 3.7   No results found for this basename: LIPASE, AMYLASE,  in the last 168 hours No results found for this basename: AMMONIA,  in the last 168  hours CBC:  Recent Labs Lab 11/21/13 1222 11/22/13 0500 11/24/13 0525  WBC 6.5 5.4 5.5  NEUTROABS 4.2  --   --   HGB 14.3 12.8 11.9*  HCT 42.3 39.5 37.3  MCV 80.4 80.8 81.6  PLT 353 305 251   Cardiac Enzymes: No results found for this basename: CKTOTAL, CKMB, CKMBINDEX, TROPONINI,  in the last 168 hours BNP: No components found with this basename: POCBNP,  CBG: No results found for this basename: GLUCAP,  in the last 168 hours   Micro Results: No results found for this or any previous visit (from the past 240 hour(s)).  Studies/Results: [place] Extrem Low Left Ltd  11/17/2013   Entered in error.   Dg Foot Complete Left  11/21/2013   CLINICAL DATA:  Great toe infection  EXAM: LEFT FOOT - COMPLETE 3+ VIEW  COMPARISON:  None.  FINDINGS: There is no acute fracture or dislocation. There is mild osseous resorption at the distal tuft of the first distal phalanx concerning for osteomyelitis. There is severe soft tissue swelling of the great toe. There is no subcutaneous emphysema. There is peripheral vascular atherosclerotic disease.  IMPRESSION: 1. Mild osseous resorption at the distal tuft of the first distal phalanx concerning for osteomyelitis given the overlying severe soft tissue swelling. Further evaluation with MRI would be helpful.   Electronically Signed   By: Elige Ko   On: 11/21/2013 13:09   Dg Toe Great Left  11/07/2013   CLINICAL DATA:  Pain.  EXAM: LEFT GREAT TOE  COMPARISON:  None.  FINDINGS: Erosive changes are noted of the distal tuft of the left great toe. An erosive lesion including osteomyelitis could present in this fashion. There is adjacent soft tissue swelling. Degenerative changes are noted about the first metacarpal phalangeal and first interphalangeal joint. Subchondral cyst is noted in the distal aspect of the proximal phalanx medially. This is most likely degenerative. No radiopaque foreign bodies. Vascular calcification noted.  IMPRESSION: 1. Erosive changes  noted distal tuft of the left great toe. Erosive process including osteomyelitis could present in this fashion. There is adjacent soft tissue swelling. No radiopaque foreign body.  2.  DJD.  3.  Peripheral vascular disease.   Electronically Signed   By: Maisie Fus  Register   On: 11/07/2013 13:44    Medications: Scheduled Meds: . aspirin  81 mg Oral Daily  . atorvastatin  20 mg Oral q1800  . ciprofloxacin  500 mg Oral BID  . clopidogrel  75 mg Oral Q breakfast  . doxycycline  100 mg Oral Q12H  . enoxaparin (LOVENOX) injection  40 mg Subcutaneous Q24H  . hydroxychloroquine  200 mg Oral Daily  . lisinopril  20 mg Oral Daily  .  morphine injection  2 mg Intravenous Once  . sodium chloride  3 mL Intravenous Q12H      LOS: 4 days   RAI,RIPUDEEP M.D. [facility] 11/25/2013, 2:15 PM Pager: [phone]  If 7PM-7AM, please contact night-coverage [email or web address] Password [email or web address]

## 2013-11-26 DIAGNOSIS — I10 Essential (primary) hypertension: Secondary | ICD-10-CM

## 2013-11-26 DIAGNOSIS — I96 Gangrene, not elsewhere classified: Secondary | ICD-10-CM

## 2013-11-26 NOTE — Discharge Summary (Signed)
Physician Discharge Summary  Mercedes [name] KGU:[id number] DOB: [birth year] DOA: 11/21/2013  PCP: Rene Paci, MD  Admit date: 11/21/2013 Discharge date: 11/26/2013  Time spent: Less than 30 minutes  Recommendations for Outpatient Follow-up:  1. Dr. Fabienne Bruns, VVS on 12/02/2013 for toe surgery 2. Dr. Rene Paci, PCP in 2 weeks 3. Dr. Olga Millers, Cardiology in 2 weeks  Discharge Diagnoses:  Principal Problem:   Cellulitis Active Problems:   HYPERLIPIDEMIA   HYPERTENSION   PERIPHERAL VASCULAR DISEASE   CAD    Discharge Condition: Improved & Stable  Diet recommendation: Heart Healthy diet.  Filed Weights   11/21/13 1731  Weight: 53.978 kg (119 lb)    History of present illness:  58 yo female with HTN, HLD, CAD, PVD, NSTEMI in 2010, SLE, Sjogren's disease, former smoker but quit several years ago, presented to [facility] ED with main concern of more than one week duration of progressively worsening left great toe pain, constant and throbbing, 7/10 in severity and worse with weight bearing and touching, no specific alleviating factors, associated with yellow pus. She explains she has had subungual abscess and has required I&D done my Dr. Antoine Primas (11/12/2013) and was placed on Keflex. Pain has gotten worse since this I&D and Dr. Katrinka Blazing was concerned for osteomyelitis and progressive ischemia given history of PVD. Pt denies any specific trauma to the foot, no similar events in the past, no numbness or tingling, no changes in sensation, no fevers, chills, no specific concerns such as chest pain, shortness of breath, no abdominal or urinary concerns.  In ED, pt hemodynamically stable, blood work including CBC and BMP unremarkable, physical exam findings certainly worrisome for osteo and with underlying ischemia. TRh asked to admit for further management and vascular surgery consulted for further recommendations.   Hospital Course:   1. Gangrene left first toe &  osteomyelitis/PVD: Patient was admitted. Vascular surgery was consulted. She underwent aortogram with bilateral lower extremity runoff on 11/24/13 and findings are as below. Dr. Darrick Penna recommended left femoral popliteal bypass and amputation of the toe which is scheduled for 12/02/12. He also recommended discharge home from his standpoint on oral antibiotics. Patient was initially placed on IV vancomycin and Zosyn which were switched to oral doxycycline and ciprofloxacin. She has tolerated these antibiotics. Patient has been switched from Brilinta to Plavix as indicated below and as per Dr. Fransico Him discussion with Dr. Darrick Penna, she can continue taking Plavix through surgery. 2. History of CAD: Patient had drug-eluting stent placed in September 2014. She had been started on Brilinta and Aspirin but apparently has not been tolerating the Brilinta. Cardiology was consulted and recommended switching her to Plavix 75 mg daily and continue aspirin. No chest pain or dyspnea. Outpatient followup with her primary cardiologist. 3. Dyspnea: Unclear etiology. Chest x-ray without acute findings.? Related to Brilinta or anxiety. Resolved. 4. History of hyperlipidemia: LDL 84. 5. Hypertension: Fluctuating but improved at discharge. Continue lisinopril and outpatient followup. 6. Minimal epistaxis, left-sided: Resolved. Patient advised to monitor closely while on antiplatelet agents and seek immediate medical attention if she has recurrence. She verbalized understanding. 7. Newly diagnosed type II DM: Hemoglobin A1c 6.5. Try diet control.   Consultations:  Cardiology  Vascular surgery  Procedures: PROCEDURE:  1. Ultrasound-guided access to the right common femoral artery  2. Aortogram with bilateral iliac arteriogram  3. Selective catheterization of the left external iliac artery with left lower extremity runoff  4. Retrograde right femoral  FINDINGS:  1. Their single renal arteries  bilaterally with no significant  renal artery stenosis identified. The infrarenal aorta is widely patent as are bilateral common iliac arteries and external iliac arteries although these are small. The left hypogastric artery is occluded.  2. On the right side, there is a mild disease in the common femoral artery but no focal stenosis. Deep femoral artery is patent. The superficial femoral artery has moderate diffuse disease proximally and is occluded in the proximal thigh. The above-knee pop 2 artery reconstitutes and is single vessel runoff on the right via the peroneal artery. The anterior tibial and posterior tibial arteries are occluded.  3. On the left side, which is the symptomatic side, there is diffuse disease in the proximal superficial femoral artery and then the superficial femoral arteries occluded in the proximal thigh with reconstitution of the above-knee popliteal artery. There is single vessel runoff on the left via the peroneal artery. The anterior tibial and posterior tibial arteries are occluded. A short segment of the dorsalis pedis at the ankle is patent but then is occluded beyond that.   Discharge Exam:  Complaints:  Minimal bleeding/spotting from left nostril this morning-has stopped. Denies nose picking or blowing nose. Denies any other complaints and is anxious to go home.  Filed Vitals:   11/25/13 2028 11/25/13 2121 11/26/13 0426 11/26/13 1000  BP: 174/92 147/65 109/68 111/66  Pulse:  61 60 57  Temp:  99.2 F (37.3 C) 99.1 F (37.3 C)   TempSrc:  Oral Oral   Resp:  18 16   Height:      Weight:      SpO2:  100% 98%     General: Alert and awake, oriented x3  CVS: S1-S2 clear, no murmur rubs or gallops. Telemetry: Sinus bradycardia in the 50s-sinus rhythm  Chest: CTAB. No increased work of breathing.  Abdomen: soft nontender, nondistended, normal bowel sounds  Extremities: no c/c. Gangrenous changes of the left first toe. No features of cellulitis at this time. HEENT: No evidence currently of  ongoing or prior left epistaxis   Discharge Instructions  Discharge Orders   Future Orders Complete By Expires   Call MD for:  redness, tenderness, or signs of infection (pain, swelling, redness, odor or green/yellow discharge around incision site)  As directed    Call MD for:  severe uncontrolled pain  As directed    Call MD for:  temperature >100.4  As directed    Diet - low sodium heart healthy  As directed    Increase activity slowly  As directed        Medication List    STOP taking these medications       cephALEXin 500 MG capsule  Commonly known as:  KEFLEX     Ticagrelor 90 MG Tabs tablet  Commonly known as:  BRILINTA      TAKE these medications       aspirin 81 MG tablet  Take 1 tablet (81 mg total) by mouth daily.     atorvastatin 40 MG tablet  Commonly known as:  LIPITOR  Take 40 mg by mouth daily.     ciprofloxacin 500 MG tablet  Commonly known as:  CIPRO  Take 1 tablet (500 mg total) by mouth 2 (two) times daily. X 2weeks     clopidogrel 75 MG tablet  Commonly known as:  PLAVIX  Take 1 tablet (75 mg total) by mouth daily with breakfast.     doxycycline 100 MG tablet  Commonly known as:  VIBRA-TABS  Take 1 tablet (100 mg total) by mouth 2 (two) times daily. X 2 weeks     HYDROcodone-acetaminophen 5-325 MG per tablet  Commonly known as:  NORCO/VICODIN  Take 1 tablet by mouth every 6 (six) hours as needed for moderate pain or severe pain.     hydroxychloroquine 200 MG tablet  Commonly known as:  PLAQUENIL  Take 200 mg by mouth daily.     lisinopril 20 MG tablet  Commonly known as:  PRINIVIL,ZESTRIL  Take 1 tablet (20 mg total) by mouth daily.     nitroGLYCERIN 0.4 MG SL tablet  Commonly known as:  NITROSTAT  Place 0.4 mg under the tongue every 5 (five) minutes as needed for chest pain.     promethazine 12.5 MG tablet  Commonly known as:  PHENERGAN  Take 1 tablet (12.5 mg total) by mouth every 6 (six) hours as needed for nausea or vomiting.            Follow-up Information   Follow up with Rene Paci, MD. Schedule an appointment as soon as possible for a visit in 2 weeks. (for hospital follow-up)    Specialty:  Internal Medicine   Contact information:   [address] [place] [address] [phone]       Follow up with Olga Millers, MD. Schedule an appointment as soon as possible for a visit in 2 weeks. (for hospital follow-up)    Specialty:  Cardiology   Contact information:   [address] [place] [address] [phone]       Follow up with Sherren Kerns, MD On 12/02/2013. (For toe surgery. Contact MD's office in the next day or two for appointment details.)    Specialty:  Vascular Surgery   Contact information:   [address] [place] [address] [phone]       The results of significant diagnostics from this hospitalization (including imaging, microbiology, ancillary and laboratory) are listed below for reference.    Significant Diagnostic Studies: Mr Foot Left Wo Contrast  11/22/2013   CLINICAL DATA:  Gangrene of the great toe.  EXAM: MRI OF THE LEFT FOREFOOT WITHOUT CONTRAST  TECHNIQUE: Multiplanar, multisequence MR imaging was performed. No intravenous contrast was administered.  COMPARISON:  DG FOOT COMPLETE*L* dated 11/21/2013  FINDINGS: Study is moderately degraded by motion artifact. Osteomyelitis of the terminal phalanx of the great toe is present with bone marrow edema. The cortical osteolysis seen on plain films is present around the tuft of the great toe. There is no soft tissue abscess. Mild effacement of normal fatty marrow in the distal aspect of the terminal phalanx of the great toe. 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 Cellulitis of the great toe is present. Otherwise the foot appears within normal limits.  IMPRESSION: Small area of osteomyelitis in the medial and lateral aspect of the tuft of the great toe terminal  phalanx.   Electronically Signed   By: Andreas Newport M.D.   On: 11/22/2013 13:56   [place] Extrem Low Left Ltd  11/17/2013   Entered in error.   Dg Chest Port 1 View  11/25/2013   CLINICAL DATA:  Weakness, chest pain with shortness of breath and nausea  EXAM: PORTABLE CHEST - 1 VIEW  COMPARISON:  PA and lateral chest x-ray dated October 04, 2013.  FINDINGS: The lungs are mildly hyperinflated. There is no focal infiltrate. The cardiac silhouette is normal in size. The pulmonary  vascularity is not engorged. The mediastinum is normal in width. There is no pleural effusion or pneumothorax. The observed portions of the bony thorax appear normal.  IMPRESSION: There is mild hyperinflation consistent with COPD. There is no evidence of CHF or pneumonia nor other acute cardiopulmonary disease.   Electronically Signed   By: David  [place]   On: 11/25/2013 13:16   Dg Foot Complete Left  11/21/2013   CLINICAL DATA:  Great toe infection  EXAM: LEFT FOOT - COMPLETE 3+ VIEW  COMPARISON:  None.  FINDINGS: There is no acute fracture or dislocation. There is mild osseous resorption at the distal tuft of the first distal phalanx concerning for osteomyelitis. There is severe soft tissue swelling of the great toe. There is no subcutaneous emphysema. There is peripheral vascular atherosclerotic disease.  IMPRESSION: 1. Mild osseous resorption at the distal tuft of the first distal phalanx concerning for osteomyelitis given the overlying severe soft tissue swelling. Further evaluation with MRI would be helpful.   Electronically Signed   By: Elige Ko   On: 11/21/2013 13:09   Dg Toe Great Left  11/07/2013   CLINICAL DATA:  Pain.  EXAM: LEFT GREAT TOE  COMPARISON:  None.  FINDINGS: Erosive changes are noted of the distal tuft of the left great toe. An erosive lesion including osteomyelitis could present in this fashion. There is adjacent soft tissue swelling. Degenerative changes are noted about the first metacarpal phalangeal and  first interphalangeal joint. Subchondral cyst is noted in the distal aspect of the proximal phalanx medially. This is most likely degenerative. No radiopaque foreign bodies. Vascular calcification noted.  IMPRESSION: 1. Erosive changes noted distal tuft of the left great toe. Erosive process including osteomyelitis could present in this fashion. There is adjacent soft tissue swelling. No radiopaque foreign body.  2.  DJD.  3.  Peripheral vascular disease.   Electronically Signed   By: Maisie Fus  Register   On: 11/07/2013 13:44    Microbiology: No results found for this or any previous visit (from the past 240 hour(s)).   Labs: Basic Metabolic Panel:  Recent Labs Lab 11/21/13 1222 11/22/13 0500 11/23/13 0428 11/24/13 0525 11/25/13 0538  NA 138 135* 137 139 139  K 4.2 4.2 3.9 4.1 4.1  CL 100 98 102 104 105  CO2 25 22 23 23 23   GLUCOSE 106* 98 93 83 93  BUN 10 12 11 7 8   CREATININE 0.88 1.13* 1.04 0.93 0.76  CALCIUM 9.7 9.2 8.9 8.9 8.9   Liver Function Tests:  Recent Labs Lab 11/21/13 1222  AST 32  ALT 36*  ALKPHOS 105  BILITOT 0.3  PROT 7.7  ALBUMIN 3.7   No results found for this basename: LIPASE, AMYLASE,  in the last 168 hours No results found for this basename: AMMONIA,  in the last 168 hours CBC:  Recent Labs Lab 11/21/13 1222 11/22/13 0500 11/24/13 0525  WBC 6.5 5.4 5.5  NEUTROABS 4.2  --   --   HGB 14.3 12.8 11.9*  HCT 42.3 39.5 37.3  MCV 80.4 80.8 81.6  PLT 353 305 251   Cardiac Enzymes: No results found for this basename: CKTOTAL, CKMB, CKMBINDEX, TROPONINI,  in the last 168 hours BNP: BNP (last 3 results)  Recent Labs  05/19/13 1736 09/02/13 1709  PROBNP 1120.0* 222.7*   CBG: No results found for this basename: GLUCAP,  in the last 168 hours  Additional labs: 1. Fasting lipids: Cholesterol 149, triglycerides 72, HDL 51, LDL 84 and  VLDL 14 2. Hemoglobin A1c: 6.5 3. TSH: 0.775   Signed:  Marcellus Scott, MD, FACP, FHM. [facility] Pager [phone]  If 7PM-7AM, please contact night-coverage [email or web address] Password [email or web address] 11/26/2013, 2:25 PM

## 2013-11-26 NOTE — Progress Notes (Signed)
DC orders received.  Patient stable with no S/S of distress. Medication and discharge information reviewed with patient and patient's family.  Stressed importance of hospital follow-up with PCP, cardiologist and vascular MD. Patient DC home. [place], Mitzi Hansen

## 2013-11-26 NOTE — Discharge Instructions (Signed)
Femoral Popliteal Bypass A femoral popliteal bypass surgery is a surgery to go around (bypass) a blocked artery in the leg by using a blood vessel or a graft. Arteries are very important. They carry oxygen and nutrients to the body. The femoral artery is in the upper part of the leg (thigh). It is the main artery that carries blood to the leg. Popliteal arteries are in the back of the knee. These arteries take blood to the lower part of the leg. LET YOUR CAREGIVER KNOW ABOUT:  Any allergies to medications, food or latex.  All medications you are taking, including:  All prescription medicines.  Over the counter medicines.  Topical skin creams.  Eye-drops, vitamins or herbs.  Previous problems with anesthesia or numbing medicine.  Problems with your heart, lungs or kidneys.  Any history of blood clots or bleeding problems.  Previous surgery.  Other health problems.  Possibility of pregnancy, if possible. RISKS AND COMPLICATIONS  Problems after surgery (complications) can occur. Possibilities include:  Bleeding.  Loss of pulses in your surgical leg due to a blood clot.  Blood clot in the graft site.  Failure of the graft.  Infection. BEFORE THE PROCEDURE  A medical history and physical exam will be done. Other tests may include:  Blood tests.  A test to check the heart's rhythm (electrocardiogram).  A test to look inside your arteries, to see how the blood is flowing (angiogram). To do this, a dye is injected into your blood. Then X-rays are taken.  You may need to be admitted to the hospital the day before your surgery.  You should have nothing by mouth (NPO) starting at 12 midnight the day of your surgery. PROCEDURE The surgeon will bypass the blocked section of your artery. By bypassing the blocked area of the artery, blood flow is restored to the lower part of your leg. A bypass is done in one of two ways using either:  A blood vessel called the Saphenous  vein.  A man made (synthetic) graft. AFTER THE PROCEDURE  You will stay in a recovery area until the anesthesia has worn off. Your blood pressure and pulse will be checked. After the recovery room, you may be taken to the intensive care unit (ICU).  You will be given pain medicine to keep you comfortable.  You may have ice packs over the surgical area. This can help reduce swelling and pain.  Your surgical site will be checked often after your surgery. This is to make sure you have good blood flow in your leg.  Caregivers will feel for a heartbeat (pulse) on your foot. Your leg will also be checked for changes in color, temperature or for decreased sensation.  To prevent blood clots in your legs, you will begin activity a few hours after your surgery.  You may stay in the ICU for less than a day. From there, you will go to a regular hospital room.  When it is time for you to go home, you will be given discharge instructions. You will be shown how to care for your incisions. Ask when your incision site can get wet. Document Released: 06/11/2009 Document Revised: 11/06/2011 Document Reviewed: 06/11/2009 [facility] Patient Information 2014 [place], [place].

## 2013-11-27 ENCOUNTER — Other Ambulatory Visit: Payer: Self-pay

## 2013-11-28 ENCOUNTER — Encounter (HOSPITAL_COMMUNITY): Payer: Self-pay | Admitting: Pharmacy Technician

## 2013-12-01 ENCOUNTER — Encounter (HOSPITAL_COMMUNITY)
Admission: RE | Admit: 2013-12-01 | Discharge: 2013-12-01 | Disposition: A | Discharge status: Home / Self Care | Payer: Medicare Other | Source: Ambulatory Visit | Attending: Vascular Surgery | Admitting: Vascular Surgery

## 2013-12-01 ENCOUNTER — Encounter (HOSPITAL_COMMUNITY): Payer: Self-pay

## 2013-12-01 ENCOUNTER — Other Ambulatory Visit (HOSPITAL_COMMUNITY): Payer: Self-pay | Admitting: *Deleted

## 2013-12-01 HISTORY — DX: Anxiety disorder, unspecified: F41.9

## 2013-12-01 HISTORY — DX: Anemia, unspecified: D64.9

## 2013-12-01 LAB — URINALYSIS, ROUTINE W REFLEX MICROSCOPIC
Bilirubin Urine: NEGATIVE
GLUCOSE, UA: NEGATIVE mg/dL
HGB URINE DIPSTICK: NEGATIVE
Ketones, ur: NEGATIVE mg/dL
LEUKOCYTES UA: NEGATIVE
Nitrite: NEGATIVE
Protein, ur: NEGATIVE mg/dL
SPECIFIC GRAVITY, URINE: 1.018 (ref 1.005–1.030)
Urobilinogen, UA: 0.2 mg/dL (ref 0.0–1.0)
pH: 5.5 (ref 5.0–8.0)

## 2013-12-01 LAB — SURGICAL PCR SCREEN
MRSA, PCR: NEGATIVE
Staphylococcus aureus: NEGATIVE

## 2013-12-01 LAB — TYPE AND SCREEN
ABO/RH(D): B POS
Antibody Screen: NEGATIVE

## 2013-12-01 LAB — PROTIME-INR
INR: 1.07 (ref 0.00–1.49)
Prothrombin Time: 13.7 seconds (ref 11.6–15.2)

## 2013-12-01 LAB — ABO/RH: ABO/RH(D): B POS

## 2013-12-01 LAB — APTT: APTT: 27 s (ref 24–37)

## 2013-12-01 MED ORDER — DEXTROSE 5 % IV SOLN
1.5000 g | INTRAVENOUS | Status: AC
  Administered 2013-12-02: 1.5 g via INTRAVENOUS
  Filled 2013-12-01: qty 1.5

## 2013-12-01 NOTE — Pre-Procedure Instructions (Signed)
Mercedes [name]  12/01/2013   Your procedure is scheduled on:  Tuesday, December 02, 2013 at 7:30 AM.   Report to [facility] Entrance "A" Admitting Office at 5:30 AM.   Call this number if you have problems the morning of surgery: [phone]   Remember:   Do not eat food or drink liquids after midnight tonight.   Take these medicines the morning of surgery with A SIP OF WATER: Aspirin, HYDROcodone-acetaminophen (NORCO/VICODIN) - if needed, promethazine (PHENERGAN) - if needed, nitroGLYCERIN (NITROSTAT) - if needed.    Do not wear jewelry, make-up or nail polish.  Do not wear lotions, powders, or perfumes. You may wear deodorant.  Do not shave 48 hours prior to surgery.   Do not bring valuables to the hospital.  [facility] is not responsible                  for any belongings or valuables.               Contacts, dentures or bridgework may not be worn into surgery.  Leave suitcase in the car. After surgery it may be brought to your room.  For patients admitted to the hospital, discharge time is determined by your                treatment team.            Special Instructions: [facility] - Preparing for Surgery  Before surgery, you can play an important role.  Because skin is not sterile, your skin needs to be as free of germs as possible.  You can reduce the number of germs on you skin by washing with CHG (chlorahexidine gluconate) soap before surgery.  CHG is an antiseptic cleaner which kills germs and bonds with the skin to continue killing germs even after washing.  Please DO NOT use if you have an allergy to CHG or antibacterial soaps.  If your skin becomes reddened/irritated stop using the CHG and inform your nurse when you arrive at Short Stay.  Do not shave (including legs and underarms) for at least 48 hours prior to the first CHG shower.  You may shave your face.  Please follow these instructions carefully:   1.  Shower with CHG Soap the night before surgery and  the                                morning of Surgery.  2.  If you choose to wash your hair, wash your hair first as usual with your       normal shampoo.  3.  After you shampoo, rinse your hair and body thoroughly to remove the                      Shampoo.  4.  Use CHG as you would any other liquid soap.  You can apply chg directly       to the skin and wash gently with scrungie or a clean washcloth.  5.  Apply the CHG Soap to your body ONLY FROM THE NECK DOWN.        Do not use on open wounds or open sores.  Avoid contact with your eyes, ears, mouth and genitals (private parts).  Wash genitals (private parts) with your normal soap.  6.  Wash thoroughly, paying special attention to the area where your surgery  will be performed.  7.  Thoroughly rinse your body with warm water from the neck down.  8.  DO NOT shower/wash with your normal soap after using and rinsing off       the CHG Soap.  9.  Pat yourself dry with a clean towel.            10.  Wear clean pajamas.            11.  Place clean sheets on your bed the night of your first shower and do not        sleep with pets.  Day of Surgery  Do not apply any lotions the morning of surgery.  Please wear clean clothes to the hospital/surgery center.     Please read over the following fact sheets that you were given: Pain Booklet, Coughing and Deep Breathing, Blood Transfusion Information, MRSA Information and Surgical Site Infection Prevention

## 2013-12-02 ENCOUNTER — Encounter (HOSPITAL_COMMUNITY)
Admission: RE | Disposition: A | Discharge status: Home Health | Payer: Self-pay | Source: Ambulatory Visit | Attending: Vascular Surgery

## 2013-12-02 ENCOUNTER — Inpatient Hospital Stay (HOSPITAL_COMMUNITY)
Admission: RE | Admit: 2013-12-02 | Discharge: 2013-12-08 | DRG: 253 | Disposition: A | Discharge status: Home Health | Payer: Medicare Other | Source: Ambulatory Visit | Attending: Vascular Surgery | Admitting: Vascular Surgery

## 2013-12-02 ENCOUNTER — Inpatient Hospital Stay (HOSPITAL_COMMUNITY): Payer: Medicare Other

## 2013-12-02 ENCOUNTER — Encounter (HOSPITAL_COMMUNITY): Payer: Medicare Other | Admitting: Vascular Surgery

## 2013-12-02 ENCOUNTER — Inpatient Hospital Stay (HOSPITAL_COMMUNITY): Payer: Medicare Other | Admitting: Vascular Surgery

## 2013-12-02 ENCOUNTER — Encounter (HOSPITAL_COMMUNITY): Payer: Self-pay | Admitting: *Deleted

## 2013-12-02 DIAGNOSIS — K219 Gastro-esophageal reflux disease without esophagitis: Secondary | ICD-10-CM | POA: Diagnosis present

## 2013-12-02 DIAGNOSIS — F329 Major depressive disorder, single episode, unspecified: Secondary | ICD-10-CM | POA: Diagnosis present

## 2013-12-02 DIAGNOSIS — I739 Peripheral vascular disease, unspecified: Secondary | ICD-10-CM | POA: Diagnosis present

## 2013-12-02 DIAGNOSIS — I5181 Takotsubo syndrome: Secondary | ICD-10-CM | POA: Diagnosis present

## 2013-12-02 DIAGNOSIS — I70269 Atherosclerosis of native arteries of extremities with gangrene, unspecified extremity: Secondary | ICD-10-CM

## 2013-12-02 DIAGNOSIS — E119 Type 2 diabetes mellitus without complications: Secondary | ICD-10-CM | POA: Diagnosis present

## 2013-12-02 DIAGNOSIS — I10 Essential (primary) hypertension: Secondary | ICD-10-CM | POA: Diagnosis present

## 2013-12-02 DIAGNOSIS — F3289 Other specified depressive episodes: Secondary | ICD-10-CM | POA: Diagnosis present

## 2013-12-02 DIAGNOSIS — M329 Systemic lupus erythematosus, unspecified: Secondary | ICD-10-CM | POA: Diagnosis present

## 2013-12-02 DIAGNOSIS — D62 Acute posthemorrhagic anemia: Secondary | ICD-10-CM | POA: Diagnosis not present

## 2013-12-02 DIAGNOSIS — I428 Other cardiomyopathies: Secondary | ICD-10-CM | POA: Diagnosis present

## 2013-12-02 DIAGNOSIS — E785 Hyperlipidemia, unspecified: Secondary | ICD-10-CM | POA: Diagnosis present

## 2013-12-02 DIAGNOSIS — M069 Rheumatoid arthritis, unspecified: Secondary | ICD-10-CM | POA: Diagnosis present

## 2013-12-02 DIAGNOSIS — I2510 Atherosclerotic heart disease of native coronary artery without angina pectoris: Secondary | ICD-10-CM | POA: Diagnosis present

## 2013-12-02 DIAGNOSIS — I252 Old myocardial infarction: Secondary | ICD-10-CM

## 2013-12-02 DIAGNOSIS — Z87891 Personal history of nicotine dependence: Secondary | ICD-10-CM

## 2013-12-02 DIAGNOSIS — I96 Gangrene, not elsewhere classified: Secondary | ICD-10-CM | POA: Diagnosis present

## 2013-12-02 HISTORY — PX: AMPUTATION: SHX166

## 2013-12-02 HISTORY — PX: FEMORAL-POPLITEAL BYPASS GRAFT: SHX937

## 2013-12-02 LAB — GLUCOSE, CAPILLARY: GLUCOSE-CAPILLARY: 105 mg/dL — AB (ref 70–99)

## 2013-12-02 SURGERY — BYPASS GRAFT FEMORAL-POPLITEAL ARTERY
Anesthesia: General | Site: Toe | Laterality: Left

## 2013-12-02 MED ORDER — POTASSIUM CHLORIDE CRYS ER 20 MEQ PO TBCR: 20.0000 meq | EXTENDED_RELEASE_TABLET | Freq: Every day | ORAL | Status: DC | PRN

## 2013-12-02 MED ORDER — NITROGLYCERIN 0.4 MG SL SUBL: 0.4000 mg | SUBLINGUAL_TABLET | SUBLINGUAL | Status: DC | PRN

## 2013-12-02 MED ORDER — DOXYCYCLINE HYCLATE 100 MG PO TABS
100.0000 mg | ORAL_TABLET | Freq: Two times a day (BID) | ORAL | Status: DC
  Administered 2013-12-02 – 2013-12-03 (×3): 100 mg via ORAL
  Filled 2013-12-02 (×5): qty 1

## 2013-12-02 MED ORDER — PROPOFOL 10 MG/ML IV BOLUS
INTRAVENOUS | Status: AC
  Filled 2013-12-02: qty 20

## 2013-12-02 MED ORDER — GLYCOPYRROLATE 0.2 MG/ML IJ SOLN
INTRAMUSCULAR | Status: DC | PRN
  Administered 2013-12-02: 0.4 mg via INTRAVENOUS

## 2013-12-02 MED ORDER — TICAGRELOR 90 MG PO TABS
90.0000 mg | ORAL_TABLET | Freq: Two times a day (BID) | ORAL | Status: DC
  Administered 2013-12-03: 90 mg via ORAL
  Filled 2013-12-02 (×4): qty 1

## 2013-12-02 MED ORDER — SODIUM CHLORIDE 0.9 % IV SOLN: INTRAVENOUS | Status: DC

## 2013-12-02 MED ORDER — MIDAZOLAM HCL 2 MG/2ML IJ SOLN
INTRAMUSCULAR | Status: AC
  Filled 2013-12-02: qty 2

## 2013-12-02 MED ORDER — OXYCODONE HCL 5 MG/5ML PO SOLN
5.0000 mg | Freq: Once | ORAL | Status: AC | PRN
  Administered 2013-12-02: 5 mg via ORAL

## 2013-12-02 MED ORDER — HYDROMORPHONE HCL PF 1 MG/ML IJ SOLN
INTRAMUSCULAR | Status: AC
  Administered 2013-12-02: 0.5 mg via INTRAVENOUS
  Filled 2013-12-02: qty 1

## 2013-12-02 MED ORDER — ATORVASTATIN CALCIUM 40 MG PO TABS
40.0000 mg | ORAL_TABLET | Freq: Every day | ORAL | Status: DC
  Administered 2013-12-03 – 2013-12-07 (×5): 40 mg via ORAL
  Filled 2013-12-02 (×7): qty 1

## 2013-12-02 MED ORDER — HYDROXYCHLOROQUINE SULFATE 200 MG PO TABS
200.0000 mg | ORAL_TABLET | Freq: Every day | ORAL | Status: DC
  Administered 2013-12-03 – 2013-12-08 (×6): 200 mg via ORAL
  Filled 2013-12-02 (×7): qty 1

## 2013-12-02 MED ORDER — ROCURONIUM BROMIDE 100 MG/10ML IV SOLN
INTRAVENOUS | Status: DC | PRN
  Administered 2013-12-02: 50 mg via INTRAVENOUS

## 2013-12-02 MED ORDER — MORPHINE SULFATE 2 MG/ML IJ SOLN
2.0000 mg | INTRAMUSCULAR | Status: DC | PRN
  Administered 2013-12-02 – 2013-12-03 (×6): 2 mg via INTRAVENOUS
  Administered 2013-12-03: 4 mg via INTRAVENOUS
  Filled 2013-12-02 (×3): qty 1
  Filled 2013-12-02: qty 2
  Filled 2013-12-02 (×3): qty 1

## 2013-12-02 MED ORDER — NEOSTIGMINE METHYLSULFATE 1 MG/ML IJ SOLN
INTRAMUSCULAR | Status: AC
  Filled 2013-12-02: qty 10

## 2013-12-02 MED ORDER — LIDOCAINE HCL (CARDIAC) 20 MG/ML IV SOLN
INTRAVENOUS | Status: DC | PRN
  Administered 2013-12-02: 80 mg via INTRAVENOUS

## 2013-12-02 MED ORDER — LISINOPRIL 20 MG PO TABS
20.0000 mg | ORAL_TABLET | Freq: Every day | ORAL | Status: DC
  Administered 2013-12-03 – 2013-12-08 (×6): 20 mg via ORAL
  Filled 2013-12-02 (×7): qty 1

## 2013-12-02 MED ORDER — IOHEXOL 300 MG/ML  SOLN
INTRAMUSCULAR | Status: DC | PRN
  Administered 2013-12-02: 25 mL via INTRAVENOUS

## 2013-12-02 MED ORDER — 0.9 % SODIUM CHLORIDE (POUR BTL) OPTIME
TOPICAL | Status: DC | PRN
  Administered 2013-12-02: 2000 mL

## 2013-12-02 MED ORDER — PANTOPRAZOLE SODIUM 40 MG PO TBEC
40.0000 mg | DELAYED_RELEASE_TABLET | Freq: Every day | ORAL | Status: DC
  Administered 2013-12-03 – 2013-12-08 (×6): 40 mg via ORAL
  Filled 2013-12-02 (×7): qty 1

## 2013-12-02 MED ORDER — SODIUM CHLORIDE 0.9 % IR SOLN
Status: DC | PRN
  Administered 2013-12-02: 07:00:00

## 2013-12-02 MED ORDER — PROPOFOL 10 MG/ML IV BOLUS
INTRAVENOUS | Status: DC | PRN
  Administered 2013-12-02 (×2): 20 mg via INTRAVENOUS
  Administered 2013-12-02: 10 mg via INTRAVENOUS
  Administered 2013-12-02: 150 mg via INTRAVENOUS

## 2013-12-02 MED ORDER — HEPARIN SODIUM (PORCINE) 1000 UNIT/ML IJ SOLN
INTRAMUSCULAR | Status: AC
  Filled 2013-12-02: qty 1

## 2013-12-02 MED ORDER — NEOSTIGMINE METHYLSULFATE 1 MG/ML IJ SOLN
INTRAMUSCULAR | Status: DC | PRN
  Administered 2013-12-02: 3 mg via INTRAVENOUS

## 2013-12-02 MED ORDER — LACTATED RINGERS IV SOLN
INTRAVENOUS | Status: DC | PRN
  Administered 2013-12-02 (×3): via INTRAVENOUS

## 2013-12-02 MED ORDER — PROTAMINE SULFATE 10 MG/ML IV SOLN
INTRAVENOUS | Status: DC | PRN
  Administered 2013-12-02 (×7): 10 mg via INTRAVENOUS

## 2013-12-02 MED ORDER — HYDROMORPHONE HCL PF 1 MG/ML IJ SOLN
0.2500 mg | INTRAMUSCULAR | Status: DC | PRN
  Administered 2013-12-02 (×2): 0.5 mg via INTRAVENOUS

## 2013-12-02 MED ORDER — PROMETHAZINE HCL 25 MG PO TABS: 12.5000 mg | ORAL_TABLET | Freq: Four times a day (QID) | ORAL | Status: DC | PRN

## 2013-12-02 MED ORDER — SODIUM CHLORIDE 0.9 % IV SOLN
INTRAVENOUS | Status: DC
  Administered 2013-12-02: 50 mL/h via INTRAVENOUS

## 2013-12-02 MED ORDER — DEXTROSE 5 % IV SOLN
INTRAVENOUS | Status: DC | PRN
  Administered 2013-12-02: 08:00:00 via INTRAVENOUS

## 2013-12-02 MED ORDER — PHENOL 1.4 % MT LIQD: 1.0000 | OROMUCOSAL | Status: DC | PRN

## 2013-12-02 MED ORDER — CHLORHEXIDINE GLUCONATE 4 % EX LIQD
60.0000 mL | Freq: Once | CUTANEOUS | Status: DC
  Filled 2013-12-02: qty 60

## 2013-12-02 MED ORDER — FENTANYL CITRATE 0.05 MG/ML IJ SOLN
INTRAMUSCULAR | Status: AC
  Filled 2013-12-02: qty 5

## 2013-12-02 MED ORDER — DOCUSATE SODIUM 100 MG PO CAPS
100.0000 mg | ORAL_CAPSULE | Freq: Every day | ORAL | Status: DC
  Administered 2013-12-03 – 2013-12-07 (×5): 100 mg via ORAL
  Filled 2013-12-02 (×7): qty 1

## 2013-12-02 MED ORDER — PHENYLEPHRINE HCL 10 MG/ML IJ SOLN
10.0000 mg | INTRAVENOUS | Status: DC | PRN
  Administered 2013-12-02: 20 ug/min via INTRAVENOUS

## 2013-12-02 MED ORDER — ROCURONIUM BROMIDE 50 MG/5ML IV SOLN
INTRAVENOUS | Status: AC
  Filled 2013-12-02: qty 1

## 2013-12-02 MED ORDER — ONDANSETRON HCL 4 MG/2ML IJ SOLN
INTRAMUSCULAR | Status: DC | PRN
  Administered 2013-12-02: 4 mg via INTRAVENOUS

## 2013-12-02 MED ORDER — OXYCODONE HCL 5 MG/5ML PO SOLN
ORAL | Status: AC
  Administered 2013-12-02: 5 mg via ORAL
  Filled 2013-12-02: qty 5

## 2013-12-02 MED ORDER — GUAIFENESIN-DM 100-10 MG/5ML PO SYRP: 15.0000 mL | ORAL_SOLUTION | ORAL | Status: DC | PRN

## 2013-12-02 MED ORDER — GLYCOPYRROLATE 0.2 MG/ML IJ SOLN
INTRAMUSCULAR | Status: AC
  Filled 2013-12-02: qty 2

## 2013-12-02 MED ORDER — PROTAMINE SULFATE 10 MG/ML IV SOLN
INTRAVENOUS | Status: AC
  Filled 2013-12-02: qty 5

## 2013-12-02 MED ORDER — ONDANSETRON HCL 4 MG/2ML IJ SOLN
4.0000 mg | Freq: Four times a day (QID) | INTRAMUSCULAR | Status: DC | PRN
  Administered 2013-12-03: 4 mg via INTRAVENOUS
  Filled 2013-12-02: qty 2

## 2013-12-02 MED ORDER — ASPIRIN EC 81 MG PO TBEC
81.0000 mg | DELAYED_RELEASE_TABLET | Freq: Every day | ORAL | Status: DC
  Administered 2013-12-03 – 2013-12-08 (×6): 81 mg via ORAL
  Filled 2013-12-02 (×6): qty 1

## 2013-12-02 MED ORDER — HYDRALAZINE HCL 20 MG/ML IJ SOLN: 10.0000 mg | INTRAMUSCULAR | Status: DC | PRN

## 2013-12-02 MED ORDER — SODIUM CHLORIDE 0.9 % IV SOLN: 500.0000 mL | Freq: Once | INTRAVENOUS | Status: AC | PRN

## 2013-12-02 MED ORDER — DEXTROSE 5 % IV SOLN
1.5000 g | Freq: Two times a day (BID) | INTRAVENOUS | Status: AC
  Administered 2013-12-02 – 2013-12-03 (×2): 1.5 g via INTRAVENOUS
  Filled 2013-12-02 (×2): qty 1.5

## 2013-12-02 MED ORDER — ALUM & MAG HYDROXIDE-SIMETH 200-200-20 MG/5ML PO SUSP: 15.0000 mL | ORAL | Status: DC | PRN

## 2013-12-02 MED ORDER — LIDOCAINE HCL (CARDIAC) 20 MG/ML IV SOLN
INTRAVENOUS | Status: AC
  Filled 2013-12-02: qty 5

## 2013-12-02 MED ORDER — ARTIFICIAL TEARS OP OINT
TOPICAL_OINTMENT | OPHTHALMIC | Status: DC | PRN
  Administered 2013-12-02: 1 via OPHTHALMIC

## 2013-12-02 MED ORDER — LABETALOL HCL 5 MG/ML IV SOLN
10.0000 mg | INTRAVENOUS | Status: DC | PRN
  Filled 2013-12-02: qty 4

## 2013-12-02 MED ORDER — DOPAMINE-DEXTROSE 3.2-5 MG/ML-% IV SOLN: 3.0000 ug/kg/min | INTRAVENOUS | Status: DC

## 2013-12-02 MED ORDER — ARTIFICIAL TEARS OP OINT
TOPICAL_OINTMENT | OPHTHALMIC | Status: AC
  Filled 2013-12-02: qty 3.5

## 2013-12-02 MED ORDER — ACETAMINOPHEN 650 MG RE SUPP: 325.0000 mg | RECTAL | Status: DC | PRN

## 2013-12-02 MED ORDER — HYDROCODONE-ACETAMINOPHEN 5-325 MG PO TABS
1.0000 | ORAL_TABLET | Freq: Four times a day (QID) | ORAL | Status: DC | PRN
  Administered 2013-12-03 – 2013-12-08 (×16): 1 via ORAL
  Filled 2013-12-02 (×16): qty 1

## 2013-12-02 MED ORDER — MIDAZOLAM HCL 5 MG/5ML IJ SOLN
INTRAMUSCULAR | Status: DC | PRN
  Administered 2013-12-02 (×3): 1 mg via INTRAVENOUS

## 2013-12-02 MED ORDER — ACETAMINOPHEN 325 MG PO TABS
325.0000 mg | ORAL_TABLET | ORAL | Status: DC | PRN
  Administered 2013-12-04: 325 mg via ORAL
  Filled 2013-12-02: qty 2
  Filled 2013-12-02: qty 1

## 2013-12-02 MED ORDER — METOPROLOL TARTRATE 1 MG/ML IV SOLN: 2.0000 mg | INTRAVENOUS | Status: DC | PRN

## 2013-12-02 MED ORDER — HEPARIN SODIUM (PORCINE) 1000 UNIT/ML IJ SOLN
INTRAMUSCULAR | Status: DC | PRN
  Administered 2013-12-02: 7000 [IU] via INTRAVENOUS

## 2013-12-02 MED ORDER — FENTANYL CITRATE 0.05 MG/ML IJ SOLN
INTRAMUSCULAR | Status: DC | PRN
  Administered 2013-12-02 (×2): 25 ug via INTRAVENOUS
  Administered 2013-12-02: 100 ug via INTRAVENOUS
  Administered 2013-12-02 (×2): 25 ug via INTRAVENOUS
  Administered 2013-12-02 (×2): 50 ug via INTRAVENOUS

## 2013-12-02 MED ORDER — PROMETHAZINE HCL 25 MG/ML IJ SOLN: 6.2500 mg | INTRAMUSCULAR | Status: DC | PRN

## 2013-12-02 MED ORDER — ALBUMIN HUMAN 5 % IV SOLN
INTRAVENOUS | Status: DC | PRN
  Administered 2013-12-02: 09:00:00 via INTRAVENOUS

## 2013-12-02 MED ORDER — OXYCODONE HCL 5 MG PO TABS: 5.0000 mg | ORAL_TABLET | Freq: Once | ORAL | Status: AC | PRN

## 2013-12-02 MED ORDER — THROMBIN 20000 UNITS EX SOLR
CUTANEOUS | Status: AC
  Filled 2013-12-02: qty 20000

## 2013-12-02 MED ORDER — CIPROFLOXACIN HCL 500 MG PO TABS
500.0000 mg | ORAL_TABLET | Freq: Two times a day (BID) | ORAL | Status: DC
  Administered 2013-12-02 – 2013-12-03 (×3): 500 mg via ORAL
  Filled 2013-12-02 (×6): qty 1

## 2013-12-02 MED ORDER — ONDANSETRON HCL 4 MG/2ML IJ SOLN
INTRAMUSCULAR | Status: AC
  Filled 2013-12-02: qty 2

## 2013-12-02 SURGICAL SUPPLY — 82 items
BANDAGE ELASTIC 4 VELCRO ST LF (GAUZE/BANDAGES/DRESSINGS) IMPLANT
BANDAGE ESMARK 6X9 LF (GAUZE/BANDAGES/DRESSINGS) IMPLANT
BANDAGE GAUZE ELAST BULKY 4 IN (GAUZE/BANDAGES/DRESSINGS) ×3 IMPLANT
BLADE SURG 10 STRL SS (BLADE) ×3 IMPLANT
BLADE SURG ROTATE 9660 (MISCELLANEOUS) ×3 IMPLANT
BNDG ESMARK 6X9 LF (GAUZE/BANDAGES/DRESSINGS)
CANISTER SUCTION 2500CC (MISCELLANEOUS) ×3 IMPLANT
CANNULA VESSEL W/WING WO/VALVE (CANNULA) ×3 IMPLANT
CLIP TI MEDIUM 24 (CLIP) ×3 IMPLANT
CLIP TI WIDE RED SMALL 24 (CLIP) ×6 IMPLANT
COVER SURGICAL LIGHT HANDLE (MISCELLANEOUS) ×3 IMPLANT
CUFF TOURNIQUET SINGLE 24IN (TOURNIQUET CUFF) IMPLANT
CUFF TOURNIQUET SINGLE 34IN LL (TOURNIQUET CUFF) IMPLANT
CUFF TOURNIQUET SINGLE 44IN (TOURNIQUET CUFF) IMPLANT
DERMABOND ADVANCED (GAUZE/BANDAGES/DRESSINGS) ×1
DERMABOND ADVANCED .7 DNX12 (GAUZE/BANDAGES/DRESSINGS) ×2 IMPLANT
DRAIN SNY WOU (WOUND CARE) IMPLANT
DRAPE EXTREMITY T 121X128X90 (DRAPE) ×3 IMPLANT
DRAPE WARM FLUID 44X44 (DRAPE) ×3 IMPLANT
DRAPE X-RAY CASS 24X20 (DRAPES) IMPLANT
DRSG COVADERM 4X10 (GAUZE/BANDAGES/DRESSINGS) ×3 IMPLANT
DRSG COVADERM 4X14 (GAUZE/BANDAGES/DRESSINGS) ×3 IMPLANT
DRSG COVADERM 4X8 (GAUZE/BANDAGES/DRESSINGS) ×3 IMPLANT
DRSG EMULSION OIL 3X3 NADH (GAUZE/BANDAGES/DRESSINGS) IMPLANT
ELECT REM PT RETURN 9FT ADLT (ELECTROSURGICAL) ×3
ELECTRODE REM PT RTRN 9FT ADLT (ELECTROSURGICAL) ×2 IMPLANT
EVACUATOR SILICONE 100CC (DRAIN) IMPLANT
GAUZE SPONGE 4X4 16PLY XRAY LF (GAUZE/BANDAGES/DRESSINGS) ×3 IMPLANT
GAUZE XEROFORM 1X8 LF (GAUZE/BANDAGES/DRESSINGS) ×3 IMPLANT
GLOVE BIO SURGEON STRL SZ 6.5 (GLOVE) ×6 IMPLANT
GLOVE BIO SURGEON STRL SZ7.5 (GLOVE) ×3 IMPLANT
GLOVE BIOGEL PI IND STRL 7.0 (GLOVE) ×8 IMPLANT
GLOVE BIOGEL PI IND STRL 8 (GLOVE) ×4 IMPLANT
GLOVE BIOGEL PI INDICATOR 7.0 (GLOVE) ×4
GLOVE BIOGEL PI INDICATOR 8 (GLOVE) ×2
GLOVE SURG SS PI 7.0 STRL IVOR (GLOVE) ×6 IMPLANT
GOWN STRL REUS W/ TWL LRG LVL3 (GOWN DISPOSABLE) ×6 IMPLANT
GOWN STRL REUS W/ TWL XL LVL3 (GOWN DISPOSABLE) ×4 IMPLANT
GOWN STRL REUS W/TWL LRG LVL3 (GOWN DISPOSABLE) ×3
GOWN STRL REUS W/TWL XL LVL3 (GOWN DISPOSABLE) ×2
KIT BASIN OR (CUSTOM PROCEDURE TRAY) ×3 IMPLANT
KIT ROOM TURNOVER OR (KITS) ×3 IMPLANT
LOOP VESSEL MAXI BLUE (MISCELLANEOUS) ×9 IMPLANT
LOOP VESSEL MINI RED (MISCELLANEOUS) ×6 IMPLANT
NS IRRIG 1000ML POUR BTL (IV SOLUTION) ×6 IMPLANT
PACK GENERAL/GYN (CUSTOM PROCEDURE TRAY) ×3 IMPLANT
PACK PERIPHERAL VASCULAR (CUSTOM PROCEDURE TRAY) ×3 IMPLANT
PAD ARMBOARD 7.5X6 YLW CONV (MISCELLANEOUS) ×6 IMPLANT
PADDING CAST COTTON 6X4 STRL (CAST SUPPLIES) IMPLANT
SET COLLECT BLD 21X3/4 12 (NEEDLE) IMPLANT
SET COLLECT BLD 21X3/4 12 PB (MISCELLANEOUS) ×6 IMPLANT
SPECIMEN JAR SMALL (MISCELLANEOUS) ×6 IMPLANT
SPONGE GAUZE 4X4 12PLY (GAUZE/BANDAGES/DRESSINGS) IMPLANT
SPONGE GAUZE 4X4 12PLY STER LF (GAUZE/BANDAGES/DRESSINGS) ×3 IMPLANT
SPONGE LAP 18X18 X RAY DECT (DISPOSABLE) ×3 IMPLANT
SPONGE SURGIFOAM ABS GEL 100 (HEMOSTASIS) IMPLANT
STAPLER VISISTAT 35W (STAPLE) ×6 IMPLANT
STOPCOCK 4 WAY LG BORE MALE ST (IV SETS) ×3 IMPLANT
SUT ETHILON 3 0 PS 1 (SUTURE) ×6 IMPLANT
SUT PROLENE 5 0 C 1 24 (SUTURE) ×3 IMPLANT
SUT PROLENE 6 0 CC (SUTURE) ×6 IMPLANT
SUT PROLENE 7 0 BV 1 (SUTURE) ×6 IMPLANT
SUT PROLENE 7 0 BV1 MDA (SUTURE) IMPLANT
SUT SILK 2 0 SH (SUTURE) ×3 IMPLANT
SUT SILK 3 0 (SUTURE) ×2
SUT SILK 3-0 18XBRD TIE 12 (SUTURE) ×4 IMPLANT
SUT SILK 4 0 (SUTURE) ×1
SUT SILK 4-0 18XBRD TIE 12 (SUTURE) ×2 IMPLANT
SUT VIC AB 2-0 CTX 36 (SUTURE) ×6 IMPLANT
SUT VIC AB 3-0 SH 27 (SUTURE) ×3
SUT VIC AB 3-0 SH 27X BRD (SUTURE) ×6 IMPLANT
SUT VIC AB 4-0 PS2 27 (SUTURE) ×6 IMPLANT
SWAB COLLECTION DEVICE MRSA (MISCELLANEOUS) IMPLANT
TAPE CLOTH SURG 4X10 WHT LF (GAUZE/BANDAGES/DRESSINGS) ×3 IMPLANT
TAPE UMBILICAL COTTON 1/8X30 (MISCELLANEOUS) IMPLANT
TOWEL OR 17X24 6PK STRL BLUE (TOWEL DISPOSABLE) ×9 IMPLANT
TOWEL OR 17X26 10 PK STRL BLUE (TOWEL DISPOSABLE) ×6 IMPLANT
TRAY FOLEY CATH 16FRSI W/METER (SET/KITS/TRAYS/PACK) ×6 IMPLANT
TUBE ANAEROBIC SPECIMEN COL (MISCELLANEOUS) IMPLANT
TUBING EXTENTION W/L.L. (IV SETS) ×3 IMPLANT
UNDERPAD 30X30 INCONTINENT (UNDERPADS AND DIAPERS) ×3 IMPLANT
WATER STERILE IRR 1000ML POUR (IV SOLUTION) ×6 IMPLANT

## 2013-12-02 NOTE — Transfer of Care (Signed)
Immediate Anesthesia Transfer of Care Note  Patient: Mercedes [name]  Procedure(s) Performed: Procedure(s):  LEFT FEMORAL-BELOW KNEE POPLITEAL ARTERY BYPASS GRAFT (Left) LEFT 1ST TOE AMPUTATION (Left)  Patient Location: PACU  Anesthesia Type:General  Level of Consciousness: awake, alert  and patient cooperative  Airway & Oxygen Therapy: Patient Spontanous Breathing and Patient connected to nasal cannula oxygen  Post-op Assessment: Report given to PACU RN, Post -op Vital signs reviewed and stable and Patient moving all extremities  Post vital signs: Reviewed and stable  Complications: No apparent anesthesia compllications

## 2013-12-02 NOTE — Anesthesia Preprocedure Evaluation (Addendum)
Anesthesia Evaluation  Patient identified by MRN, date of birth, ID band Patient awake    Reviewed: Allergy & Precautions, H&P , NPO status , Patient's Chart, lab work & pertinent test results, reviewed documented beta blocker date and time   Airway Mallampati: I TM Distance: >3 FB Neck ROM: Full    Dental  (+) Edentulous Upper, Partial Lower   Pulmonary former smoker,  Quit cigs four months breath sounds clear to auscultation        Cardiovascular hypertension, Pt. on medications + angina + CAD, + Past MI, + Cardiac Stents and + Peripheral Vascular Disease + dysrhythmias Rhythm:Regular Rate:Normal  No NTG at all since stent. 2014 ECHO in chart and noted   Neuro/Psych    GI/Hepatic GERD-  Medicated and Controlled,  Endo/Other    Renal/GU      Musculoskeletal  (+) Arthritis -, Rheumatoid disorders,    Abdominal   Peds  Hematology   Anesthesia Other Findings   Reproductive/Obstetrics                          Anesthesia Physical Anesthesia Plan  ASA: IV  Anesthesia Plan: General   Post-op Pain Management:    Induction: Intravenous  Airway Management Planned: Oral ETT  Additional Equipment: Arterial line  Intra-op Plan:   Post-operative Plan: Extubation in OR  Informed Consent: I have reviewed the patients History and Physical, chart, labs and discussed the procedure including the risks, benefits and alternatives for the proposed anesthesia with the patient or authorized representative who has indicated his/her understanding and acceptance.     Plan Discussed with: CRNA and Surgeon  Anesthesia Plan Comments: (Multiple and severe comorbidities)        Anesthesia Quick Evaluation

## 2013-12-02 NOTE — Progress Notes (Signed)
Left foot warm PT doppler Awaiting 3300 bed  Fabienne Bruns, MD [facility] Office: [phone] Pager: [phone]

## 2013-12-02 NOTE — OR Nursing (Signed)
End of femoral/politeal bypass graft @1202 .

## 2013-12-02 NOTE — Op Note (Signed)
Procedure: Left femoral to below knee popliteal bypass with non-reversed ipsilateral great saphenous vein, left first toe amputation, intraop arteriogram  Preoperative diagnosis: Gangrene left first toe  Postoperative diagnosis: Same  Anesthesia: General  Asst.: Doreatha Massed, PA-C  Operative findings:     Good quality saphenous vein 2.5-3 mm, 1 vessel peroneal runoff  Operative details: After obtaining informed consent, the patient was taken to the operating room. The patient was placed in supine position on the operating room table. After induction of general anesthesia and endotracheal intubation, a Foley catheter was placed. Next, the patient's entire left lower extremity was prepped and draped in the usual sterile fashion. A longitudinal incision was then made in the left groin and carried down through the subcutaneous tissues to expose the left common femoral artery. The common femoral artery was dissected free circumferentially. There was a pulse within the common femoral artery. The distal external iliac artery was dissected free circumferentially underneath the inguinal ligament.  A vessel loop was also placed around the distal external iliac artery. The profunda and superficial femoral arteries were dissected free circumferentially and vessel loops placed around them.  Next the saphenofemoral junction was identified in the medial portion of the groin incision and this was harvested through several skip incisions on the medial aspect of the leg.  Side branches were ligated and divided between silk ties or clips.  The vein was of good quality 2.5-3 mm diameter  The vein harvest incision was deepened into the fascia at the below knee segment and the below knee popliteal space was entered.  The popliteal artery was dissected free circumferentially.  It was soft on palpation.  A tunnel was then created between the heads of the gastrocnemius muscle subsartorial up to the groin.  The vein was  ligated distally with clips and at the saphenofemoral junction with a 2 0 silk tie.  The vein was gently distended with heparinized saline and inspected for hemostasis.    The patient was given 7000 units of heparin.  After appropriate circulation time, the distal right external iliac artery was controlled with a vessel loop.The profunda and SFA were controlled with vessel loops.   A longitudinal opening was made in the common femoral artery on its anterior surface.  The vessel was thickened but sewable. The vein was placed in a non reversed configuration.  The arteriotomy was extended with Pott's scissors.  The vein was spatulated and sewn end to side to the artery using a running 6 0 Prolene.  Just prior to completion anastomosis everything was forebled backbled and thoroughly flushed. Proximal clamp and distal clamps were removed and there was good pulsatile flow in the profunda femoris artery immediately. The SFA was chronically occluded.    Next a valvulotome was brought on the operative field and all the valves completely lysed.  There was good pulsatile flow through the graft.  It was marked for orientation.  The graft was then brought through the subsartorial tunnel down to the below-knee popliteal artery after marking for orientation. The below-knee popliteal artery was controlled proximally and distally with small bulldog clamps. A longitudinal opening was made in the distal below-knee popliteal artery in an area that was fairly free of calcification. The graft was then cut to length and spatulated and sewn end of graft to side of artery using running 6-0 Prolene suture.  At completion of the anastomosis everything was forebled backbled and thoroughly flushed. The remainder of the anastomosis was completed and all clamps were  removed restoring pulsatile flow to the below-knee popliteal artery. An intraoperative arteriogram was obtained by introducing a 21 gauge butterfly needle in the proximal vein  graft.  The arteriogram was obtained with inflow occlusion.  The showed a patent distal anastomosis with 1 vessel runoff via a small peroneal. The needle was removed and the hole repaired with a single 6 0 prolene suture. The patient had monophasic Doppler flow in the posterior tibial areas of the foot. The heparin was fully reversed with protamine.  After hemostasis was obtained, the deep layers and subcutaneous layers of the below-knee popliteal incision were closed with running 3-0 Vicryl suture. The skin was closed with staples.   The saphenectomy incisions were closed with running 3 0 vicryl follow by staples.  The groin was inspected and found to be hemostatic. This was then closed in multiple layers of running 2 0 and 3-0 Vicryl suture and 4-0 subcuticular stitch. After dressings were applied to the leg.  I proceeded with amputation of the first toe on the left foot.  A circumferential incision was made at the base of the first toe.  The toe was amputated with bone cutters.  The bone was debrided back to the cartilaginous portion of the first metatarsal head.  The wound was thoroughly irrigated.  Hemostasis was obtained.  The skin edges were then approximated with 3 0 nylon vertical mattress and simple sutures.   The patient tolerated the procedure well and there were no complications. Instrument sponge and needle counts correct in the case. Patient was taken to the recovery in stable condition.  Fabienne Bruns, MD [name] Office: [phone] Pager: [phone]

## 2013-12-02 NOTE — Anesthesia Postprocedure Evaluation (Signed)
  Anesthesia Post-op Note  Patient: Mercedes [name]  Procedure(s) Performed: Procedure(s):  LEFT FEMORAL-BELOW KNEE POPLITEAL ARTERY BYPASS GRAFT (Left) LEFT 1ST TOE AMPUTATION (Left)  Patient Location: PACU  Anesthesia Type:General  Level of Consciousness: awake and alert   Airway and Oxygen Therapy: Patient Spontanous Breathing  Post-op Pain: mild  Post-op Assessment: Post-op Vital signs reviewed  Post-op Vital Signs: stable  Complications: No apparent anesthesia complications

## 2013-12-02 NOTE — Anesthesia Procedure Notes (Signed)
Procedure Name: Intubation Date/Time: 12/02/2013 7:45 AM Performed by: Darcey Nora B Pre-anesthesia Checklist: Patient identified, Emergency Drugs available, Suction available and Patient being monitored Patient Re-evaluated:Patient Re-evaluated prior to inductionOxygen Delivery Method: Circle system utilized Preoxygenation: Pre-oxygenation with 100% oxygen Intubation Type: IV induction Ventilation: Mask ventilation without difficulty Laryngoscope Size: Mac and 3 Grade View: Grade I Tube size: 7.5 mm Number of attempts: 1 Airway Equipment and Method: Stylet Secured at: 21 (cm at upper gum) cm Tube secured with: Tape Dental Injury: Teeth and Oropharynx as per pre-operative assessment

## 2013-12-02 NOTE — Progress Notes (Signed)
Dr. Jacklynn Bue notified of pts sustained high blood pressure.  Pt. Is comfortable after receiving pain meds.  Dr. Jacklynn Bue stated pt needs a high pulse pressure and we will wait and continue to monitor.

## 2013-12-02 NOTE — H&P (View-Only) (Signed)
[facility] [place] HISTORY AND PHYSICAL   History of Present Illness:  Patient is a 58 y.o. year old female who presents for evaluation of pain and dark discoloration of left first toe.  Pt began having pain and color changes in that toe 4 months ago.  She has had slow progressive worsening.  She has chronic claudication left > right at 1 block which has been present at least 6 months.  She quit smoking 6 months ago.  No prior leg procedures.  She was admitted yesterday for pain control and IV antibiotics.  Other medical problems include cardiomyopathy, arthritis, reflux, hyperlipidemia, hypertension, coronary disease, lupus, Sjogren's all currently stable.  Hemoglobin A1C consistant with Diabetes.  Past Medical History  Diagnosis Date  . 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 PAD (peripheral artery disease)     eval by Dr. McAlhany in past but patient canceled angiogram  . RA (rheumatoid arthritis)     prior tx by [facility]  . GERD (gastroesophageal reflux disease)   . Dizziness     ? CVA 01/2010 - carotid dopplers with no ICA stenosis  . Arnold-Chiari malformation, type I     MRI brain 02/2010  . Bradycardia   . Tobacco abuse     quit 04/2013  . DEPRESSION   . HYPERLIPIDEMIA   . HYPERTENSION   . CAD (coronary artery disease)     a. NSTEMI 07/2009: LHC - D2 40%, LAD irreg., EF 50% with apical AK (tako-tsubo CM);  b. Inf STEMI (04/2013): Tx Promus DES to CFX;  c. 08/2013 Lexi CL: No ischemia, dist ant/ap/inf-ap infarct, EF   . SLE (systemic lupus erythematosus) dx 05/2013  . Sjogren's syndrome 06/02/13  . Abnormal LFTs     a. 08/2013 Lipitor 40 d/c'd and crestor 10 started - f/u lft's scheduled for 10/01/2013.    Past Surgical History  Procedure Laterality Date  . None    . Coronary angioplasty    . Tubal ligation      Social History History  Substance Use Topics  . Smoking status: Former Smoker     Types: Cigarettes    Quit date: 05/19/2013  . Smokeless tobacco: Former User    Quit date: 05/19/2013     Comment:    . Alcohol Use: Yes     Comment: i DONT DRINK ANYMORE"    Family History Family History  Problem Relation Age of Onset  . Arthritis Mother   . Arthritis Father   . Diabetes Other     Allergies  No Known Allergies   Current Facility-Administered Medications  Medication Dose Route Frequency Provider Last Rate Last Dose  . 0.9 %  sodium chloride infusion   Intravenous Continuous Ripudeep K Rai, MD 100 mL/hr at 11/22/13 0809    . aspirin tablet 81 mg  81 mg Oral Daily Iskra M Myers, MD      . enoxaparin (LOVENOX) injection 40 mg  40 mg Subcutaneous Q24H Iskra M Myers, MD   40 mg at 11/21/13 2045  . HYDROcodone-acetaminophen (NORCO/VICODIN) 5-325 MG per tablet 1-2 tablet  1-2 tablet Oral Q4H PRN Iskra M Myers, MD   2 tablet at 11/22/13 0136  . HYDROmorphone (DILAUDID) injection 0.5 mg  0.5 mg Intravenous Q2H PRN Iskra M Myers, MD   0.5 mg at 11/22/13 0549  . ondansetron (ZOFRAN) injection 4 mg  4 mg Intravenous Q4H PRN Ripudeep   Jenna Luo, MD      . piperacillin-tazobactam (ZOSYN) IVPB 3.375 g  3.375 g Intravenous 3 times per day Herby Abraham, RPH   3.375 g at 11/22/13 0546  . sodium chloride 0.9 % injection 3 mL  3 mL Intravenous Q12H Dorothea Ogle, MD      . vancomycin (VANCOCIN) 500 mg in sodium chloride 0.9 % 100 mL IVPB  500 mg Intravenous Q12H Dorothea Ogle, MD   500 mg at 11/21/13 2044    ROS:   General:  No weight loss, Fever, chills  HEENT: No recent headaches, no nasal bleeding, no visual changes, no sore throat  Neurologic: No dizziness, blackouts, seizures. No recent symptoms of stroke or mini- stroke. No recent episodes of slurred speech, or temporary blindness.  Cardiac: No recent episodes of chest pain/pressure, no shortness of breath at rest.  No shortness of breath with exertion.  Denies history of atrial fibrillation or irregular  heartbeat  Vascular: + history of rest pain in feet.  + history of claudication.  + history of non-healing ulcer, No history of DVT   Pulmonary: No home oxygen, no productive cough, no hemoptysis,  No asthma or wheezing  Musculoskeletal:  [ ]  Arthritis, [ ]  Low back pain,  [x Joint pain  Hematologic:No history of hypercoagulable state.  No history of easy bleeding.  No history of anemia  Gastrointestinal: No hematochezia or melena,  No gastroesophageal reflux, no trouble swallowing  Urinary: [ ]  chronic Kidney disease, [ ]  on HD - [ ]  MWF or [ ]  TTHS, [ ]  Burning with urination, [ ]  Frequent urination, [ ]  Difficulty urinating;   Skin: No rashes  Psychological: No history of anxiety,  No history of depression   Physical Examination  Filed Vitals:   11/21/13 1445 11/21/13 1731 11/21/13 2109 11/22/13 0603  BP: 131/72 107/51 120/60 98/52  Pulse: 47 57 53 64  Temp:  97.8 F (36.6 C) 98.6 F (37 C) 98.9 F (37.2 C)  TempSrc:  Oral Oral Oral  Resp:  20 18   Height:  5\' 6"  (1.676 m)    Weight:  119 lb (53.978 kg)    SpO2: 97% 99% 96% 100%    Body mass index is 19.22 kg/(m^2).  General:  Alert and oriented, no acute distress HEENT: Normal Neck: No bruit or JVD Pulmonary: Clear to auscultation bilaterally Cardiac: Regular Rate and Rhythm Abdomen: Soft, non-tender, non-distended, no mass Skin: No rash, early gangrenous changes left first toe, no erythema no abscess no drainage Extremity Pulses:  2+ radial, brachial, femoral, absent popliteal dorsalis pedis, posterior tibial pulses bilaterally Musculoskeletal: No deformity or edema  Neurologic: Upper and lower extremity motor 5/5 and symmetric  DATA:  ABI 0.6 Right, 0.5 left yesterday   ASSESSMENT:  Gangrene left first toe with claudication and probably rest pain.  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  Diabetes via Hgb A1C per primary team    PLAN:  Aortogram with bilateral lower extremity runoff  possible intervention on Monday 11/24/13 NPO p midnight Sunday consent  , MD [facility] Office: [phone] Pager: [phone]

## 2013-12-02 NOTE — Interval H&P Note (Signed)
History and Physical Interval Note:  12/02/2013 7:24 AM  Mercedes [name]  has presented today for surgery, with the diagnosis of Peripheral Vascular Disease with gangrene left 1st toe and rest pain  The various methods of treatment have been discussed with the patient and family. After consideration of risks, benefits and other options for treatment, the patient has consented to  Procedure(s): BYPASS GRAFT FEMORAL-BELOW KNEE POPLITEAL ARTERY- LEFT (Left) AMPUTATION DIGIT-LEFT 1ST TOE (Left) as a surgical intervention .  The patient's history has been reviewed, patient examined, no change in status, stable for surgery.  I have reviewed the patient's chart and labs.  Questions were answered to the patient's satisfaction.     FIELDS,CHARLES E

## 2013-12-03 LAB — CBC
HCT: 31.9 % — ABNORMAL LOW (ref 36.0–46.0)
HEMOGLOBIN: 10.3 g/dL — AB (ref 12.0–15.0)
MCH: 25.8 pg — ABNORMAL LOW (ref 26.0–34.0)
MCHC: 32.3 g/dL (ref 30.0–36.0)
MCV: 79.8 fL (ref 78.0–100.0)
Platelets: 339 10*3/uL (ref 150–400)
RBC: 4 MIL/uL (ref 3.87–5.11)
RDW: 14.6 % (ref 11.5–15.5)
WBC: 7 10*3/uL (ref 4.0–10.5)

## 2013-12-03 LAB — BASIC METABOLIC PANEL
BUN: 9 mg/dL (ref 6–23)
CALCIUM: 8.9 mg/dL (ref 8.4–10.5)
CO2: 21 mEq/L (ref 19–32)
CREATININE: 0.73 mg/dL (ref 0.50–1.10)
Chloride: 104 mEq/L (ref 96–112)
GFR calc non Af Amer: >90 mL/min (ref >90)
GLUCOSE: 98 mg/dL (ref 70–99)
Potassium: 3.9 mEq/L (ref 3.7–5.3)
Sodium: 137 mEq/L (ref 137–147)

## 2013-12-03 LAB — HEMOGLOBIN A1C
Hgb A1c MFr Bld: 6.4 % — ABNORMAL HIGH (ref <5.7)
MEAN PLASMA GLUCOSE: 137 mg/dL — AB (ref <117)

## 2013-12-03 MED ORDER — HYDROMORPHONE HCL PF 1 MG/ML IJ SOLN
0.5000 mg | INTRAMUSCULAR | Status: DC | PRN
  Administered 2013-12-03 – 2013-12-06 (×5): 0.5 mg via INTRAVENOUS
  Filled 2013-12-03 (×5): qty 1

## 2013-12-03 MED ORDER — ENOXAPARIN SODIUM 40 MG/0.4ML ~~LOC~~ SOLN: 40.0000 mg | SUBCUTANEOUS | Status: DC

## 2013-12-03 NOTE — Progress Notes (Addendum)
[facility] Progress Note  12/03/2013 7:43 AM 1 Day Post-Op  Subjective:  "My foot hurts but it is a little better than before surgery" Also states her leg hurts at incision sites.  Tm 99.7 100's-160's systolic HR 50's-70's regular 69% RA  Filed Vitals:   12/03/13 0700  BP:   Pulse:   Temp: 99.7 F (37.6 C)  Resp:     Physical Exam: Incisions:  Covered with clean, dry and intact bandage Extremities:  + audible left PT/DP doppler signal  CBC    Component Value Date/Time   WBC 7.0 12/03/2013 0355   RBC 4.00 12/03/2013 0355   HGB 10.3* 12/03/2013 0355   HCT 31.9* 12/03/2013 0355   PLT 339 12/03/2013 0355   MCV 79.8 12/03/2013 0355   MCH 25.8* 12/03/2013 0355   MCHC 32.3 12/03/2013 0355   RDW 14.6 12/03/2013 0355   LYMPHSABS 1.5 11/21/2013 1222   MONOABS 0.6 11/21/2013 1222   EOSABS 0.2 11/21/2013 1222   BASOSABS 0.0 11/21/2013 1222    BMET    Component Value Date/Time   NA 137 12/03/2013 0355   NA 138 05/12/2013   K 3.9 12/03/2013 0355   CL 104 12/03/2013 0355   CO2 21 12/03/2013 0355   GLUCOSE 98 12/03/2013 0355   BUN 9 12/03/2013 0355   BUN 6 05/12/2013   CREATININE 0.73 12/03/2013 0355   CREATININE 0.94 05/27/2013 1035   CREATININE 0.8 05/12/2013   CALCIUM 8.9 12/03/2013 0355   GFRNONAA >90 12/03/2013 0355   GFRAA >90 12/03/2013 0355    INR    Component Value Date/Time   INR 1.07 12/01/2013 1313     Intake/Output Summary (Last 24 hours) at 12/03/13 0743 Last data filed at 12/03/13 0000  Gross per 24 hour  Intake   3090 ml  Output   1275 ml  Net   1815 ml     Assessment:  58 y.o. female is s/p:  Left femoral to below knee popliteal bypass with non-reversed ipsilateral great saphenous vein, left first toe amputation, intraop arteriogram  1 Day Post-Op  Plan: -pt doing well this am -pt needs to work with PT and mobilize today -DVT prophylaxis:  Brilinta to start today -will transfer to 2 west -will take off incisional dressings tomorrow.   Doreatha Massed,  PA-C [facility] [phone] 12/03/2013 7:43 AM    Agree with above Graft patent Nausea better Tx in am

## 2013-12-03 NOTE — Progress Notes (Signed)
INITIAL NUTRITION ASSESSMENT  DOCUMENTATION CODES Per approved criteria  -Not Applicable   INTERVENTION: RD to follow and assess for nutritional adequacy.   NUTRITION DIAGNOSIS: Inadequate oral intake related to pain as evidenced by pt report.   Goal: Intake to meet >90% of estimated nutrition needs.  Monitor: weight trends, lab trends, I/O's, PO intake, supplement tolerance  Reason for Assessment: Malnutrition Screening Tool  58 y.o. female  Admitting Dx: gangrene of toe  ASSESSMENT: PMHx significant for HTN, HLD, CAD, PVD, NSTEMI in 2010, SLE, Sjogren's disease. Admitted for planned vascular surgery for grangrene.  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  RD met with pt who reports she has lost weight from 128 lbs.  Per chart review, this 8% wt loss has occurred over several years. Pt states she has overall recently been stable.   Pt reports she was not eating well PTA due to pain in her foot.  Variable intake 2/2 post-op nausea. Has declined oral nutrition supplements in the past.  Nutrition Focused Physical Exam:  Subcutaneous Fat:  Orbital Region: WNL Upper Arm Region: WNL Thoracic and Lumbar Region: WNL  Muscle:  Temple Region: WNL Clavicle Bone Region: moderate wasting Clavicle and Acromion Bone Region: mild-moderate wasting Scapular Bone Region: WNL Dorsal Hand: mild-moderate wasting Patellar Region: WNL Anterior Thigh Region: WNL Posterior Calf Region: WNL  Edema: none present  Height: Ht Readings from Last 1 Encounters:  12/02/13 5' 5.5" (1.664 m)    Weight: Wt Readings from Last 1 Encounters:  12/02/13 117 lb 12.8 oz (53.434 kg)    Ideal Body Weight: 125 lbs  % Ideal Body Weight: 94%  Wt Readings from Last 25 Encounters:  12/02/13 117 lb 12.8 oz (53.434 kg)  12/02/13 117 lb 12.8 oz (53.434 kg)  12/01/13 117 lb 12.8  oz (53.434 kg)  11/21/13 119 lb (53.978 kg)  11/21/13 119 lb (53.978 kg)  11/12/13 117 lb (53.071 kg)  11/07/13 118 lb 12.8 oz (53.887 kg)  09/16/13 122 lb 1.9 oz (55.393 kg)  09/02/13 121 lb (54.885 kg)  09/02/13 118 lb (53.524 kg)  08/07/13 122 lb 9.6 oz (55.611 kg)  07/21/13 126 lb (57.153 kg)  07/04/13 119 lb 6.4 oz (54.159 kg)  06/13/13 116 lb 6.4 oz (52.799 kg)  06/02/13 117 lb (53.071 kg)  05/27/13 117 lb (53.071 kg)  05/21/13 121 lb 11.1 oz (55.2 kg)  05/21/13 121 lb 11.1 oz (55.2 kg)  04/30/13 121 lb (54.885 kg)  09/08/11 124 lb 1.9 oz (56.3 kg)  08/03/11 120 lb (54.432 kg)  06/08/10 128 lb (58.06 kg)  04/06/10 128 lb (58.06 kg)  03/18/10 120 lb (54.432 kg)  03/09/10 126 lb (57.153 kg)   Usual Body Weight: 120 lbs  % Usual Body Weight: 98%  BMI:  Body mass index is 19.3 kg/(m^2). WNL  Estimated Nutritional Needs: Kcal: 1500-1700 Protein: 60-70 grams Fluid: ~1.8 L/day  Skin:  DTI to posterior hand L groin incision L leg incision L foot incision  Diet Order: Cardiac  EDUCATION NEEDS: -Education needs addressed   Intake/Output Summary (Last 24 hours) at 12/03/13 1343 Last data filed at 12/03/13 0700  Gross per 24 hour  Intake   1330 ml  Output   1300 ml  Net     30 ml    Last BM: 4/6  Labs:   Recent Labs Lab 12/03/13 0355  NA 137  K 3.9  CL 104  CO2 21  BUN 9  CREATININE 0.73  CALCIUM 8.9  GLUCOSE 98    CBG (last 3)   Recent Labs  12/02/13 1249  GLUCAP 105*    Scheduled Meds: . aspirin EC  81 mg Oral Daily  . atorvastatin  40 mg Oral Daily  . ciprofloxacin  500 mg Oral BID  . docusate sodium  100 mg Oral Daily  . doxycycline  100 mg Oral BID  . hydroxychloroquine  200 mg Oral Daily  . lisinopril  20 mg Oral Daily  . pantoprazole  40 mg Oral Daily  . Ticagrelor  90 mg Oral BID    Continuous Infusions:    Past Medical History  Diagnosis Date  . Takotsubo cardiomyopathy 12.2010    f/u echo 09/2009: EF 50-55%, mild  LVH, mod diast dysfxn, mild apical HK  . PAD (peripheral artery disease)     eval by Dr. Angelena Form in past but patient canceled angiogram  . GERD (gastroesophageal reflux disease)   . Dizziness     ? CVA 01/2010 - carotid dopplers with no ICA stenosis  . Arnold-Chiari malformation, type I     MRI brain 02/2010  . Bradycardia   . Tobacco abuse     quit 04/2013  . DEPRESSION   . HYPERLIPIDEMIA   . HYPERTENSION   . CAD (coronary artery disease)     a. NSTEMI 07/2009: LHC - D2 40%, LAD irreg., EF 50% with apical AK (tako-tsubo CM);  b. Inf STEMI (04/2013): Tx Promus DES to CFX;  c. 08/2013 Lexi CL: No ischemia, dist ant/ap/inf-ap infarct, EF   . SLE (systemic lupus erythematosus) dx 05/2013  . Sjogren's syndrome 06/02/13    pt denies this (12/01/13)  . Abnormal LFTs     a. 08/2013 Lipitor 40 d/c'd and crestor 10 started - f/u lft's scheduled for 10/01/2013.  [name] Anxiety   . RA (rheumatoid arthritis)     prior tx by Dr. Ouida Sills  . Anemia     as a young woman    Past Surgical History  Procedure Laterality Date  . None    . Coronary angioplasty    . Tubal ligation    . Colonoscopy      Inda Coke MS, RD, LDN Inpatient Registered Dietitian Pager: [phone] After-hours pager: [phone]

## 2013-12-03 NOTE — Progress Notes (Signed)
Occupational Therapy Evaluation Patient Details Name: Mercedes [name] MRN: [id number] DOB: June 01, 1956 Today's Date: 12/03/2013    History of Present Illness Pt s/p L fem-pop bypass graft.   Clinical Impression   PTA, pt lived alone independently. Pt presents with functional decline due to below deficits and would benefit from OT to max independence and safety with ADL to facilitate safe D/C home with intermittent S. Pt some what limited today by pain. Feel pt will make good progress. Pt will need to be mod I to D/C home. Need clarification on WB status given great toe amputation.     Follow Up Recommendations  Home health OT;Supervision - Intermittent    Equipment Recommendations  3 in 1 bedside comode;Tub/shower bench    Recommendations for Other Services       Precautions / Restrictions Precautions Precautions: Fall Required Braces or Orthoses: Other Brace/Splint (?cast shoe) Restrictions Weight Bearing Restrictions: Yes LLE Weight Bearing: Weight bearing as tolerated Other Position/Activity Restrictions: great toe amputation. had pt weight bear via heel      Mobility Bed Mobility Overal bed mobility: Needs Assistance (not assessed, pt up in chair upon PT arrival) Bed Mobility: Sit to Supine       Sit to supine: Min assist (to assist with LLE)      Transfers Overall transfer level: Needs assistance Equipment used: Rolling walker (2 wheeled) Transfers: Sit to/from [employer] Sit to Stand: Min assist Stand pivot transfers: Min assist            Balance Overall balance assessment: Needs assistance Sitting-balance support: Feet supported Sitting balance-Leahy Scale: Good     Standing balance support: Bilateral upper extremity supported;During functional activity Standing balance-Leahy Scale: Fair                              ADL Overall ADL's : Needs assistance/impaired Eating/Feeding: Independent   Grooming: Set  up;Sitting   Upper Body Bathing: Set up;Sitting   Lower Body Bathing: Moderate assistance;Sit to/from stand Lower Body Bathing Details (indicate cue type and reason): unable to WB via LLE Upper Body Dressing : Set up;Sitting   Lower Body Dressing: Moderate assistance;Sit to/from stand   Toilet Transfer: Minimal assistance;Stand-pivot;BSC   Toileting- Clothing Manipulation and Hygiene: Sitting/lateral lean;Sit to/from stand;Set up       Functional mobility during ADLs: Minimal assistance;Rolling walker (only able to tolerate stand pivot)       Vision                     Perception     Praxis      Pertinent Vitals/Pain 7/10 LLE Repositioned nsg notified.     Hand Dominance     Extremity/Trunk Assessment Upper Extremity Assessment Upper Extremity Assessment: Overall WFL for tasks assessed   Lower Extremity Assessment Lower Extremity Assessment: Defer to PT evaluation LLE Deficits / Details: minimal ROM at hip and knee due to pain, began to tolerate ankle pumps LLE: Unable to fully assess due to pain (unable to tolerate any WBing)   Cervical / Trunk Assessment Cervical / Trunk Assessment: Normal   Communication Communication Communication: No difficulties   Cognition Arousal/Alertness: Awake/alert Behavior During Therapy: Anxious (re: pain in L LE) Overall Cognitive Status: Within Functional Limits for tasks assessed                     General Comments       Exercises  Exercises: Other exercises (encouraged pt to complete LLE AROM within pain toleracne. ) Other Exercises Other Exercises: educated pt on phantom pain Other Exercises: educated on need to compete AROM LLE within pain tolerance   Shoulder Instructions      Home Living Family/patient expects to be discharged to:: Private residence Living Arrangements: Alone Available Help at Discharge: Available PRN/intermittently Type of Home: House Home Access: Stairs to enter [employer] of Steps: 2 Entrance Stairs-Rails: None Home Layout: One level     Bathroom Shower/Tub: Tub/shower unit Shower/tub characteristics: Engineer, building services: Standard Bathroom Accessibility: Yes How Accessible: Accessible via walker Home Equipment: None          Prior Functioning/Environment Level of Independence: Independent             OT Diagnosis: Generalized weakness;Acute pain   OT Problem List: Decreased strength;Decreased range of motion;Decreased activity tolerance;Impaired balance (sitting and/or standing);Decreased safety awareness;Decreased knowledge of use of DME or AE;Decreased knowledge of precautions;Pain   OT Treatment/Interventions: Self-care/ADL training;Therapeutic exercise;Energy conservation;DME and/or AE instruction;Therapeutic activities;Patient/family education;Balance training    OT Goals(Current goals can be found in the care plan section) Acute Rehab OT Goals Patient Stated Goal: to be able to do for myself OT Goal Formulation: With patient Time For Goal Achievement: 12/17/13 Potential to Achieve Goals: Good  OT Frequency: Min 3X/week   Barriers to D/C: Other (comment) (pt thinks grand daughters can stay with her for short term)          Co-evaluation              End of Session Equipment Utilized During Treatment: Gait belt;Rolling walker Nurse Communication: Mobility status  Activity Tolerance: Patient tolerated treatment well Patient left: in bed;with call bell/phone within reach   Time: 1640-1710 OT Time Calculation (min): 30 min Charges:  OT General Charges $OT Visit: 1 Procedure OT Evaluation $Initial OT Evaluation Tier I: 1 Procedure OT Treatments $Self Care/Home Management : 23-37 mins G-Codes:    Lorinda Creed Yanisa Goodgame 2013/12/17, 5:24 PM   [facility] Kassidie Hendriks, OTR/L  [phone] 12-17-13

## 2013-12-03 NOTE — Progress Notes (Signed)
RN called and informed me pt is vomiting and pain is not well controlled.  Will d/c Morphine and start dilaudid.  If vomiting continues, will restart IVF.  She is giving Zofran for nausea.  Ames Coupe Stewart Sasaki 12/03/2013. 8:57 AM

## 2013-12-03 NOTE — Progress Notes (Signed)
Utilization review completed.  

## 2013-12-03 NOTE — Progress Notes (Signed)
Orthopedic Tech Progress Note Patient Details:  Mercedes [name] [birth year] [id number]  Ortho Devices Type of Ortho Device: Postop shoe/boot Ortho Device/Splint Location: lle Ortho Device/Splint Interventions: Application   Violett Hobbs 12/03/2013, 8:28 AM

## 2013-12-03 NOTE — Evaluation (Signed)
Physical Therapy Evaluation Patient Details Name: Mercedes [name] MRN: [id number] DOB: 09-28-1955 Today's Date: 12/03/2013   History of Present Illness  Pt s/p L fem-pop bypass graft.  Clinical Impression  Pt mobility greatly limited by pain in L LE this date. RN aware. Suspect once pain under control pt to be able to d/c home with use of RW and assist of family. However if patient can not progress to supervision level pt may need ST-SNF to allow for maximal recovery prior to transition home.    Follow Up Recommendations Supervision/Assistance - 24 hour;Home health PT    Equipment Recommendations  Rolling walker with 5" wheels    Recommendations for Other Services       Precautions / Restrictions Precautions Precautions: Fall Restrictions Weight Bearing Restrictions: Yes LLE Weight Bearing: Weight bearing as tolerated      Mobility  Bed Mobility Overal bed mobility:  (not assessed, pt up in chair upon PT arrival)                Transfers Overall transfer level: Needs assistance Equipment used: Rolling walker (2 wheeled) Transfers: Sit to/from Stand Sit to Stand: Mod assist         General transfer comment: max directional v/c's, pt unable to tolerate > 30 sec due to increased L LE pain. pt unable to place heel or toes down on floor  Ambulation/Gait Ambulation/Gait assistance:  (unable at this time)              Careers information officer    Modified Rankin (Stroke Patients Only)       Balance Overall balance assessment: Needs assistance         Standing balance support: Bilateral upper extremity supported Standing balance-Leahy Scale: Poor Standing balance comment: requires use of RW to maintain standing at this time                             Pertinent Vitals/Pain 10/10 L LE pain    Home Living Family/patient expects to be discharged to:: Private residence Living Arrangements: Alone Available Help at  Discharge: Available PRN/intermittently Type of Home: House Home Access: Stairs to enter Entrance Stairs-Rails: None Entrance Stairs-Number of Steps: 2 Home Layout: One level Home Equipment: None      Prior Function Level of Independence: Independent               Hand Dominance        Extremity/Trunk Assessment   Upper Extremity Assessment: Overall WFL for tasks assessed           Lower Extremity Assessment: LLE deficits/detail   LLE Deficits / Details: minimal ROM at hip and knee due to pain, began to tolerate ankle pumps  Cervical / Trunk Assessment: Normal  Communication   Communication: No difficulties  Cognition Arousal/Alertness: Awake/alert Behavior During Therapy: Anxious (re: pain in L LE) Overall Cognitive Status: Within Functional Limits for tasks assessed                      General Comments      Exercises General Exercises - Lower Extremity Ankle Circles/Pumps: AROM;Both;15 reps;Seated      Assessment/Plan    PT Assessment Patient needs continued PT services  PT Diagnosis Difficulty walking;Acute pain   PT Problem List Decreased strength;Decreased range of motion;Decreased activity tolerance;Decreased balance;Decreased mobility;Pain  PT Treatment Interventions  DME instruction;Gait training;Stair training;Functional mobility training;Therapeutic activities;Therapeutic exercise;Balance training   PT Goals (Current goals can be found in the Care Plan section) Acute Rehab PT Goals Patient Stated Goal: home PT Goal Formulation: With patient Time For Goal Achievement: 12/17/13 Potential to Achieve Goals: Good    Frequency Min 3X/week   Barriers to discharge Decreased caregiver support      Co-evaluation               End of Session Equipment Utilized During Treatment: Gait belt Activity Tolerance: Patient limited by pain Patient left: in chair;with call bell/phone within reach Nurse Communication: Mobility status          Time: 8250-5397 PT Time Calculation (min): 32 min   Charges:   PT Evaluation $Initial PT Evaluation Tier I: 1 Procedure PT Treatments $Therapeutic Activity: 8-22 mins   PT G CodesRozell Searing Tallis Soledad 12/03/2013, 9:23 AM  Lewis Shock, PT, DPT Pager #: [phone] Office #: [phone]

## 2013-12-04 ENCOUNTER — Encounter (HOSPITAL_COMMUNITY): Payer: Self-pay | Admitting: Vascular Surgery

## 2013-12-04 DIAGNOSIS — Z48812 Encounter for surgical aftercare following surgery on the circulatory system: Secondary | ICD-10-CM

## 2013-12-04 MED ORDER — CLOPIDOGREL BISULFATE 75 MG PO TABS
75.0000 mg | ORAL_TABLET | Freq: Every day | ORAL | Status: DC
  Administered 2013-12-04 – 2013-12-08 (×5): 75 mg via ORAL
  Filled 2013-12-04 (×6): qty 1

## 2013-12-04 NOTE — Progress Notes (Addendum)
[facility] [place]  Subjective  - Still with pain left foot and leg, nausea better   Objective 117/60 79 99.9 F (37.7 C) (Oral) 19 100%  Intake/Output Summary (Last 24 hours) at 12/04/13 0813 Last data filed at 12/04/13 0600  Gross per 24 hour  Intake    840 ml  Output   1375 ml  Net   -535 ml   Left leg incisions clean, left first toe amp clean, brisk biphasic PT DP doppler, vaguely palpable popliteal pulse pt sore in that area  Assessment/Planning: Will switch Brilinta back to Plavix per Dr Elease Hashimoto Transfer 2W Ambulate Acute  Blood loss anemia asymptomatic follow for now Has now had 2 elevated hgb A1c will have diabetes coordinator see pt Discontinue antibiotics now that toe has been amputated  Sherren Kerns 12/04/2013 8:13 AM --  Laboratory Lab Results:  Recent Labs  12/03/13 0355  WBC 7.0  HGB 10.3*  HCT 31.9*  PLT 339   BMET  Recent Labs  12/03/13 0355  NA 137  K 3.9  CL 104  CO2 21  GLUCOSE 98  BUN 9  CREATININE 0.73  CALCIUM 8.9    COAG Lab Results  Component Value Date   INR 1.07 12/01/2013   INR 0.91 09/03/2013   INR 0.99 08/16/2009   No results found for this basename: PTT

## 2013-12-04 NOTE — Progress Notes (Signed)
VASCULAR LAB PRELIMINARY  ARTERIAL  ABI completed:    RIGHT    LEFT    PRESSURE WAVEFORM  PRESSURE WAVEFORM  BRACHIAL 109 Tri BRACHIAL 103 Tri  DP   DP    AT 41 Damp mono AT 45 Damp mono  PT 72 Damp mono PT 69 Damp mono  PER   PER    GREAT TOE  NA GREAT TOE  NA    RIGHT LEFT  ABI 0.66 0.63     [place] Chard, RVT  12/04/2013, 1:27 PM

## 2013-12-04 NOTE — Progress Notes (Signed)
Physical Therapy Treatment Patient Details Name: Mercedes [name] MRN: [id number] DOB: 06-03-1956 Today's Date: 12/04/2013    History of Present Illness Pt s/p L fem-pop bypass graft. L great toe amputation.    PT Comments    Pt with improved ability to tolerate L LE in dependent position however remains unable to tolerate L LE WBing even with use of RW. Pt slowly progressing ambulation tolerance. Pt to con't to benefit from acute PT to advance OOB mobility.   Follow Up Recommendations  Supervision/Assistance - 24 hour;Home health PT     Equipment Recommendations  Rolling walker with 5" wheels    Recommendations for Other Services       Precautions / Restrictions Precautions Precautions: Fall Required Braces or Orthoses: Other Brace/Splint (donned for amb, doffed at end of session) Restrictions Weight Bearing Restrictions: Yes LLE Weight Bearing: Weight bearing as tolerated    Mobility  Bed Mobility               General bed mobility comments: not assessed, pt up in chair upon PT arrival  Transfers Overall transfer level: Needs assistance Equipment used: Rolling walker (2 wheeled) Transfers: Sit to/from Stand Sit to Stand: Min assist         General transfer comment: v/c's for safety, increased time, pt able to tolerate standing x 1 min  Ambulation/Gait Ambulation/Gait assistance: Min assist Ambulation Distance (Feet): 8 Feet Assistive device: Rolling walker (2 wheeled) Gait Pattern/deviations: Step-to pattern Gait velocity: slow   General Gait Details: pt unable to bear weight through L LE. shuffled on R LE with significant bilat UE WBing   Stairs            Wheelchair Mobility    Modified Rankin (Stroke Patients Only)       Balance           Standing balance support: Bilateral upper extremity supported Standing balance-Leahy Scale: Poor Standing balance comment: requires use of RW for safe standing                     Cognition Arousal/Alertness: Awake/alert Behavior During Therapy: Anxious (RE: pain in L LE) Overall Cognitive Status: Within Functional Limits for tasks assessed                      Exercises General Exercises - Lower Extremity Ankle Circles/Pumps: AROM;Both;15 reps;Seated Heel Slides: Left;AAROM;10 reps;Seated    General Comments        Pertinent Vitals/Pain 10/10 L LE pain with WBing    Home Living                      Prior Function            PT Goals (current goals can now be found in the care plan section) Progress towards PT goals: Progressing toward goals    Frequency  Min 3X/week    PT Plan Current plan remains appropriate    Co-evaluation             End of Session Equipment Utilized During Treatment: Gait belt Activity Tolerance: Patient limited by pain Patient left: in chair;with call bell/phone within reach     Time: 0831-0859 PT Time Calculation (min): 28 min  Charges:  $Gait Training: 8-22 mins $Therapeutic Exercise: 8-22 mins                    G Codes:      Rozell Searing  Ryson Bacha 12/04/2013, 10:24 AM  Lewis Shock, PT, DPT Pager #: [phone] Office #: [phone]

## 2013-12-04 NOTE — Progress Notes (Signed)
Pt transferred to 2W02 via bed with lead nurse. Report called and questions answered. Pt c/o discomfort at surgical site and educated with teach back. Prn pain med and comfort care given prior to discharge. Pt stable. Mercedes [name]

## 2013-12-04 NOTE — Progress Notes (Signed)
Received Diabetes Coordinator consult for elevated HgbA1C of 6.4%.  Spoke with patient to ask if she had ever been told that she has prediabetes or elevated HgbA1C. She has never been told about elevated HgbA1c.  Has blood work drawn with PCP on regular basis.  Note that hemoglobin is 10.3 on this admission.  This could cause the HgbA1C to be inaccurate.  Will need to follow up with PCP. Will follow while in hospital.  Smith Mince RN BSN CDE

## 2013-12-04 NOTE — Progress Notes (Signed)
Called Diabetes coordinator to visit pt. Pt resting with call bell within reach.  Will continue to monitor. Thomas Hoff, RN

## 2013-12-04 NOTE — Progress Notes (Signed)
OT Cancellation Note  Patient Details Name: Mercedes [name] MRN: [id number] DOB: [birth year]   Cancelled Treatment:    Reason Eval/Treat Not Completed: Patient at procedure or test/ unavailable (vacular working withpt. will attempt later)  [facility], OTR/L  [phone] 12/04/2013 12/04/2013, 6:04 PM

## 2013-12-05 NOTE — Progress Notes (Signed)
Occupational Therapy Treatment Patient Details Name: Mercedes [name] MRN: [id number] DOB: [birth year] Today's Date: 12/05/2013    History of present illness Pt s/p L fem-pop bypass graft. L great toe amputation.   OT comments  Pt. Motivated for skilled therapy but limited mobility secondary to severe c/o pain LLE.  Ambulation with NWB throughout session.  Still requiring assistance with sit/stand, And cues for hand placement and walker safety.  Pt. States she is having a family meeting to see which members can commit to providing assistance upon d/c.  If they are able agree with initial recommendations of home with intermittent S.  If no help available d/c plan may need to be changed to short term SNF placement to provide additional skilled therapies and S for safe return home.  Will pass this information along to OTR/L.   Follow Up Recommendations  Home health OT;SNF;Supervision - Intermittent (see above)   Equipment Recommendations  3 in 1 bedside comode;Tub/shower bench          Precautions / Restrictions Precautions Precautions: Fall Restrictions LLE Weight Bearing: Weight bearing as tolerated Other Position/Activity Restrictions: great toe amputation. had pt weight bear via heel       Mobility Bed Mobility Overal bed mobility: Modified Independent             General bed mobility comments: HOB flat and no rails to simulate home environment, pt. able to transition from supine to sitting straight up then bringing BLES out of bed  Transfers Overall transfer level: Needs assistance Equipment used: Rolling walker (2 wheeled) Transfers: Sit to/from [employer] Sit to Stand: Min assist Stand pivot transfers: Min guard       General transfer comment: cues for safety, and instructions to use BUE support through walker.  pt. able to bear weight but amb. with NWB of LLE.  increased time secondary to c/o pain LLE                                        ADL Overall ADL's : Needs assistance/impaired       Grooming Details (indicate cue type and reason): sim. min guard in standing   Upper Body Bathing Details (indicate cue type and reason): sim. set up sitting   Lower Body Bathing Details (indicate cue type and reason): sim. min a in standing   Upper Body Dressing Details (indicate cue type and reason): sim. set up sitting   Lower Body Dressing Details (indicate cue type and reason): min a from sit/tand-simulated Toilet Transfer: Minimal assistance;Ambulation;Regular Toilet;Grab bars Toilet Transfer Details (indicate cue type and reason): cues for hand placement on grab bar and controlled descend to toilet Toileting- Clothing Manipulation and Hygiene: Min guard;Sit to/from stand       Functional mobility during ADLs: Minimal assistance;Rolling walker General ADL Comments: lives alone, reports she if very independent.  motivated but limited mostly by pain  Pertinent Vitals/ Pain       Tearful and c/o pain through out session. Did not rate but given physical presentation can deduce it is very high.  rn notified and requested pain meds                                                          Frequency Min 3X/week     Progress Toward Goals  OT Goals(current goals can now be found in the care plan section)  Progress towards OT goals: Progressing toward goals     Plan Discharge plan needs to be updated                    End of Session Equipment Utilized During Treatment: Rolling walker   Activity Tolerance Patient tolerated treatment well   Patient Left in chair;with call bell/phone within reach   Nurse Communication Other (comment) (requested pain meds at end of session), RN also discussed with me her concerns regarding pts. D/c plans.   Agree and have documented above possible need for change of d/c plans once family details obtained.         Time: 4765-4650 OT Time Calculation (min): 25 min  Charges: OT General Charges $OT Visit: 1 Procedure OT Treatments $Self Care/Home Management : 23-37 mins  Earvin Hansen Kirby Cortese, COTA/L 12/05/2013, 7:55 AM

## 2013-12-05 NOTE — Progress Notes (Addendum)
[facility]  Subjective  - She lives alone and is trying to get home help with family.  Pain is still an issue with ambulation and mobility.   Objective 99/64 66 99 F (37.2 C) (Oral) 18 97%  Intake/Output Summary (Last 24 hours) at 12/05/13 0719 Last data filed at 12/05/13 0600  Gross per 24 hour  Intake    300 ml  Output    575 ml  Net   -275 ml    Incisions are healing well, compartments soft, left great toe amp incision healing well. Biphasic dopplers DP/PT   Assessment/Planning: POD # 3 Left femoral to below knee popliteal bypass with non-reversed ipsilateral great saphenous vein, left first toe amputation, intraop arteriogram   Plavix re-started Acute Blood loss anemia asymptomatic follow for now HGB 10.3 on the 8th will re-ck in am  ABI R 0.66, L 0.63  Consulted social work for care options if patient can't find family to help care for her D/C planning  Diabetes Coordinator She has never been told about elevated HgbA1c. Has blood work drawn with PCP on regular basis. Note that hemoglobin is 10.3 on this admission. This could cause the HgbA1C to be inaccurate. Will need to follow up with PCP. Will follow while in hospital. Smith Mince RN BSN CDE  Lars Mage 12/05/2013 7:19 AM --   Agree with above.  Wounds healing Biphasic PT DP doppler Home when ambulating and social issues resolved.  Fabienne Bruns, MD [facility] [place] Office: [phone] Pager: [phone]  Laboratory Lab Results:  Recent Labs  12/03/13 0355  WBC 7.0  HGB 10.3*  HCT 31.9*  PLT 339   BMET  Recent Labs  12/03/13 0355  NA 137  K 3.9  CL 104  CO2 21  GLUCOSE 98  BUN 9  CREATININE 0.73  CALCIUM 8.9    COAG Lab Results  Component Value Date   INR 1.07 12/01/2013   INR 0.91 09/03/2013   INR 0.99 08/16/2009   No results found for this basename: PTT

## 2013-12-05 NOTE — Progress Notes (Signed)
Physical Therapy Treatment Patient Details Name: ERRICA DUTIL MRN: [id number] DOB: 1955/11/28 Today's Date: 12/05/2013    History of Present Illness Pt s/p L fem-pop bypass graft. L great toe amputation.    PT Comments    Attempted to see pt this morning and she was in 10/10 pain despite recent IV pain medicine. Returned this pm with RN reporting pt had oral pain medicine ~45-60 minutes ago. Pt reports she had pain medicine (not able to see this in EMR) and that it has not significantly helped. Pt tearful and reports pain today has been worse overall and she does not feel like the IV or po pain medicine is effective. She agreed to only do ROM exercises (and minimally participated with these) despite explanations of benefits of increased activity. Asked for PT to return tomorrow.  Follow Up Recommendations  Home health PT;Supervision/Assistance - 24 hour (if family cannot provide, will need post-acute therapy)     Equipment Recommendations  Rolling walker with 5" wheels    Recommendations for Other Services       Precautions / Restrictions Precautions Precautions: Fall Restrictions LLE Weight Bearing: Weight bearing as tolerated    Mobility  Bed Mobility                  Transfers                    Ambulation/Gait                 Stairs            Wheelchair Mobility    Modified Rankin (Stroke Patients Only)       Balance                                    Cognition Arousal/Alertness: Awake/alert Behavior During Therapy: Anxious Overall Cognitive Status: Within Functional Limits for tasks assessed                      Exercises General Exercises - Lower Extremity Ankle Circles/Pumps: AROM;Left;5 reps;Supine Heel Slides: AROM;Left;5 reps;Supine Straight Leg Raises: AROM;Left;5 reps;Supine Other Exercises Other Exercises: Educated on need to move LE every hour to decrease edema and assist with pain  control. Pt admits she tends to keep leg still to avoid pain, and promises to begin to move it more.    General Comments        Pertinent Vitals/Pain Currently 7/10 despite pre-medication for pain. RN made aware    Home Living                      Prior Function            PT Goals (current goals can now be found in the care plan section) Progress towards PT goals: Not progressing toward goals - comment (pain limiting despite premedicaiton)    Frequency  Min 3X/week    PT Plan plan may need to be updated; pt to discuss family availability on discharge (either tonight or tomorrow) to help finalize d/c plans.     Co-evaluation             End of Session   Activity Tolerance: Patient limited by pain Patient left: in bed;with call bell/phone within reach     Time: 7673-4193 PT Time Calculation (min): 22 min  Charges:  $Therapeutic Exercise: 8-22 mins  G CodesZena Amos 12/05/2013, 1:56 PM Pager [phone]

## 2013-12-06 LAB — BASIC METABOLIC PANEL
BUN: 9 mg/dL (ref 6–23)
CALCIUM: 10.1 mg/dL (ref 8.4–10.5)
CO2: 23 meq/L (ref 19–32)
Chloride: 93 mEq/L — ABNORMAL LOW (ref 96–112)
Creatinine, Ser: 0.81 mg/dL (ref 0.50–1.10)
GFR calc Af Amer: >90 mL/min (ref >90)
GFR, EST NON AFRICAN AMERICAN: 78 mL/min — AB (ref >90)
Glucose, Bld: 162 mg/dL — ABNORMAL HIGH (ref 70–99)
Potassium: 3.6 mEq/L — ABNORMAL LOW (ref 3.7–5.3)
SODIUM: 134 meq/L — AB (ref 137–147)

## 2013-12-06 LAB — CBC
HCT: 37.6 % (ref 36.0–46.0)
Hemoglobin: 12.3 g/dL (ref 12.0–15.0)
MCH: 26.1 pg (ref 26.0–34.0)
MCHC: 32.7 g/dL (ref 30.0–36.0)
MCV: 79.8 fL (ref 78.0–100.0)
Platelets: 549 10*3/uL — ABNORMAL HIGH (ref 150–400)
RBC: 4.71 MIL/uL (ref 3.87–5.11)
RDW: 14.5 % (ref 11.5–15.5)
WBC: 7.8 10*3/uL (ref 4.0–10.5)

## 2013-12-06 NOTE — Progress Notes (Signed)
Case Management notified and to come see pt. Will continue to monitor.

## 2013-12-06 NOTE — Progress Notes (Addendum)
[facility]  Subjective  - Patient states she has a sister who can come stay with her at home.  She does not want to go to a SNF.   Objective 93/60 64 98.7 F (37.1 C) (Oral) 16 100%  Intake/Output Summary (Last 24 hours) at 12/06/13 0911 Last data filed at 12/06/13 0100  Gross per 24 hour  Intake    600 ml  Output      0 ml  Net    600 ml    Doppler DP/PT foot is warm to touch Incisions clean and dry Tender to touch at great toe site and increased pain with attempted ambulation.  Assessment/Planning: POD # 4 Left femoral to below knee popliteal bypass with non-reversed ipsilateral great saphenous vein, left first toe amputation, intraop arteriogram   Plavix re-started  Acute Blood loss anemia asymptomatic follow for now.  Pending labs today  Care management and HH with rolling walker ordered today    Mercedes [name] 12/06/2013 9:11 AM --  Laboratory Lab Results: No results found for this basename: WBC, HGB, HCT, PLT,  in the last 72 hours BMET No results found for this basename: NA, K, CL, CO2, GLUCOSE, BUN, CREATININE, CALCIUM,  in the last 72 hours  COAG Lab Results  Component Value Date   INR 1.07 12/01/2013   INR 0.91 09/03/2013   INR 0.99 08/16/2009   No results found for this basename: PTT   Agree with above assessment-toe amputation site healing nicely  Left leg wounds healing nicely with 2+ popliteal graft pulse Patient ambulating better Discussed with her disposition plans and possible discharge and she is trying to assemble "care team" at home to help person she lives alone  Hopefully discharge can be accomplished on Monday

## 2013-12-06 NOTE — Progress Notes (Signed)
Physical Therapy Treatment Patient Details Name: Mercedes [name] MRN: [id number] DOB: 10-23-1955 Today's Date: 12/06/2013    History of Present Illness Pt s/p L fem-pop bypass graft. L great toe amputation.    PT Comments    Pt able to progress mobility today with incr time and max encouragement. Pt educated on HEP to encourage AROM of Lt LE to prevent contractures. Pt reports she will have 24/7 (A) upon acute D/C. Will cont to follow per POC. Encouraged OOB activity with nursing as tolerated.   Follow Up Recommendations  Home health PT;Supervision/Assistance - 24 hour     Equipment Recommendations  Rolling walker with 5" wheels    Recommendations for Other Services       Precautions / Restrictions Precautions Precautions: Fall Required Braces or Orthoses: Other Brace/Splint Other Brace/Splint: post op shoe  Restrictions Weight Bearing Restrictions: Yes LLE Weight Bearing: Weight bearing as tolerated Other Position/Activity Restrictions: great toe amputation. had pt weight bear via heel    Mobility  Bed Mobility Overal bed mobility: Needs Assistance Bed Mobility: Supine to Sit;Sit to Supine     Supine to sit: Modified independent (Device/Increase time);HOB elevated Sit to supine: Min assist   General bed mobility comments: (A) to advance Lt LE onto bed and into supine position; pt abel to transfer to sit mod I with use of handrails and incr time due to pain  Transfers Overall transfer level: Needs assistance Equipment used: Rolling walker (2 wheeled) Transfers: Sit to/from Stand Sit to Stand: Min guard         General transfer comment: min guard to steady; pt with decr ability to WB through Lt LE due to pain and is unsteady; pt keeps trunk forwardly flexed throughout standing; toes on ground minimally during standing   Ambulation/Gait Ambulation/Gait assistance: Min assist Ambulation Distance (Feet): 35 Feet (20 rest 15 ) Assistive device: Rolling walker (2  wheeled) Gait Pattern/deviations: Step-to pattern (pt unable to achieve Lt foot flat on ground) Gait velocity: slow Gait velocity interpretation: Below normal speed for age/gender General Gait Details: pt ambulates with hop to gt; is able to put toes on ground for balance only; relies heavily on UEs for support; multiple rest breaks and decreased gt speed due to pain; pt ambulated to door and back to bed then requesting to ambulate to bathroom; cues for upright posture and gt sequencing    Stairs            Wheelchair Mobility    Modified Rankin (Stroke Patients Only)       Balance Overall balance assessment: Needs assistance Sitting-balance support: Feet supported;No upper extremity supported Sitting balance-Leahy Scale: Good     Standing balance support: During functional activity;Bilateral upper extremity supported Standing balance-Leahy Scale: Poor Standing balance comment: bil UE supported with RW at all times; min guard to maintain balance                     Cognition Arousal/Alertness: Awake/alert Behavior During Therapy: Anxious Overall Cognitive Status: Within Functional Limits for tasks assessed                      Exercises General Exercises - Lower Extremity Ankle Circles/Pumps: AROM;Both;Strengthening;10 reps;Seated Quad Sets: AAROM;Left;5 reps;Supine Long Arc Quad: AROM;Left;10 reps;Seated Hip Flexion/Marching: AROM;Left;5 reps;Seated Other Exercises Other Exercises: educated pt on DF stretch on Lt LE; hooking sheet at bottom of foot to improve DF and stretch calf; pt verbalized undrestanding  Other Exercises: educated pt  on importance of AROM of LT lE to prevent contractures     General Comments        Pertinent Vitals/Pain 5/10; premedicated by RN    Home Living                      Prior Function            PT Goals (current goals can now be found in the care plan section) Acute Rehab PT Goals Patient Stated  Goal: to go home with my family monday or tuesday PT Goal Formulation: With patient Time For Goal Achievement: 12/17/13 Potential to Achieve Goals: Good Progress towards PT goals: Progressing toward goals    Frequency  Min 3X/week    PT Plan Current plan remains appropriate    Co-evaluation             End of Session     Patient left: in bed;with call bell/phone within reach     Time: 1457-1524 PT Time Calculation (min): 27 min  Charges:  $Gait Training: 8-22 mins $Therapeutic Exercise: 8-22 mins                    G CodesNadara Mustard [place], [place] [phone] 12/06/2013, 4:46 PM

## 2013-12-07 NOTE — Progress Notes (Signed)
   CARE MANAGEMENT NOTE 12/07/2013  Patient:  KENEISHA, HECKART   Account Number:  [id number]  Date Initiated:  12/06/2013  Documentation initiated by:  [facility]  Subjective/Objective Assessment:   adm: Peripheral Vascular Disease with gangrene left 1st toe and rest pain: BYPASS GRAFT FEMORAL-BELOW KNEE POPLITEAL ARTERY- LEFT (Left)  AMPUTATION DIGIT-LEFT 1ST TOE (Left)     Action/Plan:   discharge planning   Anticipated DC Date:  12/08/2013   Anticipated DC Plan:  [place]  CM consult      [facility] Choice  HOME HEALTH   Choice offered to / List presented to:  C-1 Patient   DME arranged  [place] arranged  [place].   Status of service:  Completed, signed off Medicare Important Message given?   (If response is "NO", the following Medicare IM given date fields will be blank) Date Medicare IM given:   Date Additional Medicare IM given:    Discharge Disposition:  [place]  Per UR Regulation:    If discussed at Long Length of Stay Meetings, dates discussed:    Comments:  12/06/13 17:00 Cm met with pt and daughter,  Rich Brave [phone] in room to offer choice for [facility] services.  Pt chooses [facility] for HHPT.  Address and contact numbers verified with pt.  Rolling walker to be delivered to room prior to discharge.  Referral texted to [facility] rep, [place].  No other CM needs were communicated.  Mariane Masters, BSN, CM [phone].

## 2013-12-07 NOTE — Progress Notes (Signed)
Patient ID: Mercedes [name], female   DOB: 26-Aug-1956, 58 y.o.   MRN: [id number] Vascular Surgery Progress Note  Subjective: 5 days post left femoral-popliteal bypass graft and left first toe amputation. Patient states ambulation is better. Pain is also well controlled.  Objective:  Filed Vitals:   12/07/13 0359  BP: 110/68  Pulse: 71  Temp: 98.9 F (37.2 C)  Resp: 18    General alert and oriented x3 in no apparent distress Left lower extremity with nicely healing incisions no evidence infection. Left first toe amp site is healing well on bypass is patent.   Labs:  Recent Labs Lab 12/03/13 0355 12/06/13 1337  CREATININE 0.73 0.81    Recent Labs Lab 12/03/13 0355 12/06/13 1337  NA 137 134*  K 3.9 3.6*  CL 104 93*  CO2 21 23  BUN 9 9  CREATININE 0.73 0.81  GLUCOSE 98 162*  CALCIUM 8.9 10.1    Recent Labs Lab 12/03/13 0355 12/06/13 1337  WBC 7.0 7.8  HGB 10.3* 12.3  HCT 31.9* 37.6  PLT 339 549*    Recent Labs Lab 12/01/13 1313  INR 1.07    I/O last 3 completed shifts: In: 840 [P.O.:840] Out: -   Imaging: No results found.  Assessment/Plan:  POD #5  LOS: 5 days  s/p Procedure(s):  LEFT FEMORAL-BELOW KNEE POPLITEAL ARTERY BYPASS GRAFT LEFT 1ST TOE AMPUTATION  Patient beginning to ambulate better and pain control is satisfactory  Dr. Darrick Penna  to determine plan for discharge in a.m.    Josephina Gip, MD 12/07/2013 9:27 AM

## 2013-12-08 MED ORDER — HYDROCODONE-ACETAMINOPHEN 5-325 MG PO TABS: 1.0000 | ORAL_TABLET | Freq: Four times a day (QID) | ORAL | Status: DC | PRN

## 2013-12-08 NOTE — Progress Notes (Signed)
Physical Therapy Treatment Patient Details Name: Mercedes [name] MRN: [id number] DOB: 1956-03-24 Today's Date: 12/08/2013    History of Present Illness Pt s/p L fem-pop bypass graft. L great toe amputation.    PT Comments    Pt reports she has been using pain medicine more regularly and doing ROM exercises. Her pain is less, although still limits her. She demonstrated ability to walk a short distance and ascend a step and feels she is prepared to go home. She does want to meet with OT prior to discharge (they plan on seeing her).  Follow Up Recommendations  Home health PT;Supervision for mobility/OOB     Equipment Recommendations  Rolling walker with 5" wheels    Recommendations for Other Services       Precautions / Restrictions Precautions Precautions: Fall Required Braces or Orthoses: Other Brace/Splint Other Brace/Splint: post op shoe  Restrictions Weight Bearing Restrictions: Yes LLE Weight Bearing: Weight bearing as tolerated Other Position/Activity Restrictions: great toe amputation. had pt weight bear via heel    Mobility  Bed Mobility Overal bed mobility: Modified Independent Bed Mobility: Supine to Sit;Sit to Supine     Supine to sit: Modified independent (Device/Increase time);HOB elevated Sit to supine: Modified independent (Device/Increase time)      Transfers Overall transfer level: Needs assistance Equipment used: Rolling walker (2 wheeled) Transfers: Sit to/from [employer] Sit to Stand: Supervision   Squat pivot transfers: Min guard     General transfer comment: practiced bed to chair without RW (pt request), minguard for safety with pt demonstrating safe technique  Ambulation/Gait Ambulation/Gait assistance: Min guard Ambulation Distance (Feet): 50 Feet (15, 15, 10, 10) Assistive device: Rolling walker (2 wheeled) Gait Pattern/deviations: Step-to pattern;Antalgic;Trunk flexed Gait velocity: slow Gait velocity  interpretation: Below normal speed for age/gender General Gait Details: distances limited due to pain (Lt groin and Lt hand due to IV position); encouraged at least TDWB on Lt, however pt continues to amb NWB on Lt due to pain   Stairs Stairs: Yes Stairs assistance: Min guard Stair Management: No rails;Step to pattern;Backwards;With walker Number of Stairs: 1 General stair comments: pt reports she has 2 or 3 steps (depending on entry) and all are wide steps/platforms/pad/porch where she can place entire walker on each step, therefore practiced 1 step backwards with RW  Wheelchair Mobility    Modified Rankin (Stroke Patients Only)       Balance     Sitting balance-Leahy Scale: Good     Standing balance support: Bilateral upper extremity supported;During functional activity Standing balance-Leahy Scale: Poor Standing balance comment: bil UE supported with RW at all times; min guard to maintain balance                     Cognition Arousal/Alertness: Awake/alert Behavior During Therapy: WFL for tasks assessed/performed Overall Cognitive Status: Within Functional Limits for tasks assessed                      Exercises General Exercises - Lower Extremity Ankle Circles/Pumps: AROM;10 reps;Left;Seated Quad Sets: AAROM;Left;5 reps;Seated Other Exercises Other Exercises: educated on positoning to decr edema while promoting knee extension Other Exercises: educated pt on importance of AROM of LT lE to prevent contractures     General Comments General comments (skin integrity, edema, etc.): Again discussed regular use of pain medicine and activity (walking and ROM exercises) to decr pain. Educated on need to place Lt foot on floor for desensitization. Pt stood x  30 sec with forefoot on floor       Pertinent Vitals/Pain 5/10 Lt groin after activity. Pt reports groin pain is worse than foot pain. patient repositioned for comfort     Home Living                       Prior Function            PT Goals (current goals can now be found in the care plan section) Acute Rehab PT Goals Patient Stated Goal: to go home with my family monday or tuesday PT Goal Formulation: With patient Time For Goal Achievement: 12/17/13 Potential to Achieve Goals: Good Progress towards PT goals: Progressing toward goals    Frequency  Min 3X/week    PT Plan Current plan remains appropriate    Co-evaluation             End of Session   Activity Tolerance: Patient limited by pain Patient left: with call bell/phone within reach;in chair     Time: 0832-0906 PT Time Calculation (min): 34 min  Charges:  $Gait Training: 8-22 mins $Therapeutic Exercise: 8-22 mins                    G Codes:      [employer] 01/02/14, 9:20 AM Pager [phone]

## 2013-12-08 NOTE — Care Management Note (Signed)
    Page 1 of 2   12/08/2013     11:38:01 AM   CARE MANAGEMENT NOTE 12/08/2013  Patient:  Mercedes [name], Mercedes [name]   Account Number:  [id number]  Date Initiated:  12/06/2013  Documentation initiated by:  [facility]  Subjective/Objective Assessment:   adm: Peripheral Vascular Disease with gangrene left 1st toe and rest pain: BYPASS GRAFT FEMORAL-BELOW KNEE POPLITEAL ARTERY- LEFT (Left)  AMPUTATION DIGIT-LEFT 1ST TOE (Left)     Action/Plan:   discharge planning   Anticipated DC Date:  12/08/2013   Anticipated DC Plan:  [place]  CM consult      [facility] Choice  HOME HEALTH   Choice offered to / List presented to:  C-1 Patient   DME arranged  [place] arranged  HH-2 PT      [place].   Status of service:  Completed, signed off Medicare Important Message given?   (If response is "NO", the following Medicare IM given date fields will be blank) Date Medicare IM given:   Date Additional Medicare IM given:    Discharge Disposition:  [place]  Per UR Regulation:  Reviewed for med. necessity/level of care/duration of stay  If discussed at [place] of Stay Meetings, dates discussed:    Comments:  12/08/13 Mercedes Signer,RN,BSN [phone] PT [place]; ALSO NEEDS [facility] FOR HOME. NOTIFIED [place] OF DME NEEDS.  NOTIFIED [place].  12/06/13 17:00 Cm met with pt and daughter,  Mercedes [name] [phone] in room to offer choice for [facility] services.  Pt chooses [facility] for HHPT.  Address and contact numbers verified with pt.  Rolling walker to be delivered to room prior to discharge.  Referral texted to [facility] rep, Mercedes [name].  No other CM needs were communicated.  Mercedes [name], BSN, CM [phone].

## 2013-12-08 NOTE — Progress Notes (Signed)
[facility] [place]  Subjective  - Feels better still some phantom pain left first toe   Objective 115/67 75 98.6 F (37 C) (Oral) 18 99%  Intake/Output Summary (Last 24 hours) at 12/08/13 0743 Last data filed at 12/07/13 1630  Gross per 24 hour  Intake    600 ml  Output      0 ml  Net    600 ml   All incisions healing Toe amp healing Foot warm Biphasic doppler flow  Assessment/Planning: D/c home today Follow up 2 weeks  Mercedes [name] 12/08/2013 7:43 AM --  Laboratory Lab Results:  Recent Labs  12/06/13 1337  WBC 7.8  HGB 12.3  HCT 37.6  PLT 549*   BMET  Recent Labs  12/06/13 1337  NA 134*  K 3.6*  CL 93*  CO2 23  GLUCOSE 162*  BUN 9  CREATININE 0.81  CALCIUM 10.1    COAG Lab Results  Component Value Date   INR 1.07 12/01/2013   INR 0.91 09/03/2013   INR 0.99 08/16/2009   No results found for this basename: PTT

## 2013-12-08 NOTE — Discharge Summary (Signed)
[facility] Discharge Summary  Mercedes [name] [birth year] 58 y.o. female  [id number]  Admission Date: 12/02/2013  Discharge Date: 12/08/13  Physician: Sherren Kerns, MD  Admission Diagnosis: Peripheral Vascular Disease with gangrene left 1st toe and rest pain   HPI:   This is a 58 y.o. female who presents for evaluation of pain and dark discoloration of left first toe. Pt began having pain and color changes in that toe 4 months ago. She has had slow progressive worsening. She has chronic claudication left > right at 1 block which has been present at least 6 months. She quit smoking 6 months ago. No prior leg procedures. She was admitted yesterday for pain control and IV antibiotics. Other medical problems include cardiomyopathy, arthritis, reflux, hyperlipidemia, hypertension, coronary disease, lupus, Sjogren's all currently stable. Hemoglobin A1C consistant with Diabetes.  Hospital Course:  The patient was admitted to the hospital and taken to the operating room on 12/02/2013 and underwent: Left femoral to below knee popliteal bypass with non-reversed ipsilateral great saphenous vein, left first toe amputation, intraop arteriogram    The pt tolerated the procedure well and was transported to the PACU in good condition.   By POD 1, she was doing well.  Her Brilinta was restarted, but Dr. Darrick Penna spoke with her cardiologist and it was changed back to Plavix as Brilinta makes her sick.  She continued to have N/V.  Her morphine was changed to dilaudid and she was given Zofran.    On POD 2, her nausea and vomiting were improved and she was transferred to the telemetry floor.  Her ABx were also discontinued as the infected toe was now amputated.  Post operative ABI's are as follows:  RIGHT    LEFT     PRESSURE  WAVEFORM   PRESSURE  WAVEFORM   BRACHIAL  109  Tri  BRACHIAL  103  Tri   DP    DP     AT  41  Damp mono  AT  45  Damp mono   PT  72  Damp mono  PT  69  Damp  mono   PER    PER     GREAT TOE   NA  GREAT TOE   NA     RIGHT  LEFT   ABI  0.66  0.63    She did have acute blood loss anemia, but was tolerating this.  She was discharged on POD 5.  The remainder of the hospital course consisted of increasing mobilization and increasing intake of solids without difficulty.  CBC    Component Value Date/Time   WBC 7.8 12/06/2013 1337   RBC 4.71 12/06/2013 1337   HGB 12.3 12/06/2013 1337   HCT 37.6 12/06/2013 1337   PLT 549* 12/06/2013 1337   MCV 79.8 12/06/2013 1337   MCH 26.1 12/06/2013 1337   MCHC 32.7 12/06/2013 1337   RDW 14.5 12/06/2013 1337   LYMPHSABS 1.5 11/21/2013 1222   MONOABS 0.6 11/21/2013 1222   EOSABS 0.2 11/21/2013 1222   BASOSABS 0.0 11/21/2013 1222    BMET    Component Value Date/Time   NA 134* 12/06/2013 1337   NA 138 05/12/2013   K 3.6* 12/06/2013 1337   CL 93* 12/06/2013 1337   CO2 23 12/06/2013 1337   GLUCOSE 162* 12/06/2013 1337   BUN 9 12/06/2013 1337   BUN 6 05/12/2013   CREATININE 0.81 12/06/2013 1337   CREATININE 0.94 05/27/2013 1035   CREATININE 0.8 05/12/2013  CALCIUM 10.1 12/06/2013 1337   GFRNONAA 78* 12/06/2013 1337   GFRAA >90 12/06/2013 1337     Discharge Instructions:   The patient is discharged to home with extensive instructions on wound care and progressive ambulation.  They are instructed not to drive or perform any heavy lifting until returning to see the physician in his office.      Discharge Orders   Future Orders Complete By Expires   Call MD for:  redness, tenderness, or signs of infection (pain, swelling, bleeding, redness, odor or green/yellow discharge around incision site)  As directed    Call MD for:  severe or increased pain, loss or decreased feeling  in affected limb(s)  As directed    Call MD for:  temperature >100.5  As directed    Driving Restrictions  As directed    Lifting restrictions  As directed    may wash over wound with mild soap and water  As directed    Scheduling Instructions:    Wash the groin wound with soap and water daily and pat dry. (No tub bath-only shower)  Then put a dry gauze or washcloth there to keep this area dry daily and as needed.  Do not use Vaseline or neosporin on your incisions.  Only use soap and water on your incisions and then protect and keep dry.  Wear your post op open shoe to protect toe amputation site.   Resume previous diet  As directed       Discharge Diagnosis:  Peripheral Vascular Disease with gangrene left 1st toe and rest pain  Secondary Diagnosis: Patient Active Problem List   Diagnosis Date Noted  . PAD (peripheral artery disease) 12/02/2013  . CAD  11/22/2013  . Cellulitis 11/21/2013  . Ingrowing toenail with infection 11/12/2013  . Pain of left great toe 11/08/2013  . Unstable angina 09/03/2013  . Abnormal LFTs 09/03/2013  . Chest pain 09/02/2013  . SLE (systemic lupus erythematosus)   . Acute MI, inferior wall 05/19/2013  . Weight loss 08/03/2011  . 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 BRADYCARDIA 03/09/2010  . DEPRESSION 11/02/2009  . PERIPHERAL VASCULAR DISEASE 10/29/2009  . TOBACCO ABUSE 09/17/2009  . HYPERLIPIDEMIA 08/26/2009  . HYPERTENSION 08/26/2009  . CORONARY ATHEROSCLEROSIS NATIVE CORONARY ARTERY 08/26/2009  . CARDIOMYOPATHY, SECONDARY 08/26/2009  . GERD 08/26/2009  . Rheumatoid arthritis(714.0) 08/26/2009   Past Medical History  Diagnosis Date  . 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 PAD (peripheral artery disease)     eval by Dr. [name] in past but patient canceled angiogram  . GERD (gastroesophageal reflux disease)   . Dizziness     ? CVA 01/2010 - carotid dopplers with no ICA stenosis  . Arnold-Chiari malformation, type I     MRI brain 02/2010  . Bradycardia   . Tobacco abuse     quit 04/2013  . DEPRESSION   . HYPERLIPIDEMIA   . HYPERTENSION   . CAD (coronary artery disease)     a. NSTEMI 07/2009: LHC - D2 40%,  LAD irreg., EF 50% with apical AK (tako-tsubo CM);  b. Inf STEMI (04/2013): Tx Promus DES to CFX;  c. 08/2013 Lexi CL: No ischemia, dist ant/ap/inf-ap infarct, EF   . SLE (systemic lupus erythematosus) dx 05/2013  . Sjogren's syndrome 06/02/13    pt denies this (12/01/13)  . Abnormal LFTs     a. 08/2013 Lipitor 40 d/c'd and crestor 10  started - f/u lft's scheduled for 10/01/2013.  [name] Anxiety   . RA (rheumatoid arthritis)     prior tx by Dr. Dareen Piano  . Anemia     as a young woman       Medication List    STOP taking these medications       ciprofloxacin 500 MG tablet  Commonly known as:  CIPRO     doxycycline 100 MG tablet  Commonly known as:  VIBRA-TABS      TAKE these medications       aspirin 81 MG tablet  Take 1 tablet (81 mg total) by mouth daily.     atorvastatin 40 MG tablet  Commonly known as:  LIPITOR  Take 40 mg by mouth daily.     clopidogrel 75 MG tablet  Commonly known as:  PLAVIX  Take 1 tablet (75 mg total) by mouth daily with breakfast.     HYDROcodone-acetaminophen 5-325 MG per tablet  Commonly known as:  NORCO/VICODIN  Take 1 tablet by mouth every 6 (six) hours as needed for moderate pain or severe pain.     hydroxychloroquine 200 MG tablet  Commonly known as:  PLAQUENIL  Take 200 mg by mouth daily.     lisinopril 20 MG tablet  Commonly known as:  PRINIVIL,ZESTRIL  Take 1 tablet (20 mg total) by mouth daily.     nitroGLYCERIN 0.4 MG SL tablet  Commonly known as:  NITROSTAT  Place 0.4 mg under the tongue every 5 (five) minutes as needed for chest pain.     promethazine 12.5 MG tablet  Commonly known as:  PHENERGAN  Take 1 tablet (12.5 mg total) by mouth every 6 (six) hours as needed for nausea or vomiting.        Norco #30 No Refill  Disposition: home with family  Patient's condition: is Good  Follow up: 1. Dr. Darrick Penna in 2 weeks   Doreatha Massed, PA-C [facility] [phone] 12/08/2013  7:46 AM  - For VQI Registry  use --- Instructions: Press F2 to tab through selections.  Delete question if not applicable.   Post-op:  Wound infection: No  Graft infection: No  Transfusion: No  If yes, n/a units given New Arrhythmia: No Ipsilateral amputation: No, [ ]  Minor, [ ]  BKA, [ ]  AKA Discharge patency: [ ]  Primary, [ ]  Primary assisted, [ ]  Secondary, [ ]  Occluded Patency judged by: [ ]  Dopper only, [ ]  Palpable graft pulse, [ ]  Palpable distal pulse, [ ]  ABI inc. > 0.15, [ ]  Duplex Discharge ABI: R 0.66, L 0.63 Discharge TBI: R , L  D/C Ambulatory Status: Ambulatory with Assistance  Complications: MI: No, [ ]  Troponin only, [ ]  EKG or Clinical CHF: No Resp failure:No, [ ]  Pneumonia, [ ]  Ventilator Chg in renal function: No, [ ]  Inc. Cr > 0.5, [ ]  Temp. Dialysis, [ ]  Permanent dialysis Stroke: No, [ ]  Minor, [ ]  Major Return to OR: No  Reason for return to OR: [ ]  Bleeding, [ ]  Infection, [ ]  Thrombosis, [ ]  Revision  Discharge medications: Statin use:  yes ASA use:  yes Plavix use:  yes Beta blocker use: no Coumadin use: no

## 2013-12-08 NOTE — Progress Notes (Signed)
Occupational Therapy Treatment Patient Details Name: Mercedes [name] MRN: [id number] DOB: Nov 28, 1955 Today's Date: 12/08/2013    History of present illness Pt s/p L fem-pop bypass graft. L great toe amputation.   OT comments  Pt issued AE for LB bathing and donning sock.  Educated in multiple uses of 3 in 1 and in use and availability of hand held shower head and tub transfer bench.  Recommended pt use a bag or apron to carry items when using her walker.  Balance and pain limit her transfers, standing activities. She is heavily reliant on her walker.   Will have supervision of her sisters and granddaughters at home.  Plans to discharge today.  Follow Up Recommendations  Home health OT    Equipment Recommendations  3 in 1 bedside comode (pt will get a tub bench on her won)    Recommendations for Other Services      Precautions / Restrictions Precautions Precautions: Fall Required Braces or Orthoses: Other Brace/Splint Other Brace/Splint: post op shoe  Restrictions Weight Bearing Restrictions: Yes LLE Weight Bearing: Weight bearing as tolerated Other Position/Activity Restrictions: great toe amputation. had pt weight bear via heel       Mobility Bed Mobility       Transfers Overall transfer level: Needs assistance Equipment used: Rolling walker (2 wheeled) Transfers: Sit to/from [employer] Sit to Stand: Supervision            Balance     Sitting balance-Leahy Scale: Good     Standing balance support: Bilateral upper extremity supported;During functional activity Standing balance-Leahy Scale: Poor Standing balance comment: bil UE supported with RW at all times; min guard to maintain balance                    ADL Overall ADL's : Needs assistance/impaired     Grooming: Wash/dry hands;Supervision/safety;Standing       Lower Body Bathing: Supervison/ safety;With adaptive equipment;Sit to/from stand Lower Body Bathing Details  (indicate cue type and reason): issued long sponge     Lower Body Dressing: Supervision/safety;Sit to/from stand;With adaptive equipment Lower Body Dressing Details (indicate cue type and reason): issued sock aide, did not need reacher Toilet Transfer: Supervision/safety;Ambulation;BSC   Toileting- Architect and Hygiene: Supervision/safety;Sit to/from stand       Functional mobility during ADLs: Min guard;Rolling walker General ADL Comments: Instructed in use of 3 in1  sideways to keep foot dry, recommended hand held shower head and showed pictures of tub transfer bench.        Vision                     Perception     Praxis      Cognition   Behavior During Therapy: WFL for tasks assessed/performed Overall Cognitive Status: Within Functional Limits for tasks assessed                       Extremity/Trunk Assessment               Exercises    Shoulder Instructions       General Comments      Pertinent Vitals/ Pain       L groin, RN provided pain meds, VSS  Home Living  Prior Functioning/Environment              Frequency Min 3X/week     Progress Toward Goals  OT Goals(current goals can now be found in the care plan section)  Progress towards OT goals: Progressing toward goals  Acute Rehab OT Goals Patient Stated Goal: home today  Plan Discharge plan needs to be updated    Co-evaluation                 End of Session     Activity Tolerance Patient tolerated treatment well   Patient Left in chair;with call bell/phone within reach;with nursing/sitter in room   Nurse Communication          Time: 7026-3785 OT Time Calculation (min): 34 min  Charges: OT General Charges $OT Visit: 1 Procedure OT Treatments $Self Care/Home Management : 23-37 mins  [place] Bailiff Tri [place] 12/08/2013, 10:23 AM [phone]

## 2013-12-08 NOTE — Progress Notes (Signed)
[facility] Progress Note  12/08/2013 7:17 AM 6 Days Post-Op  Subjective:  Ready to go home.  States she has someone to stay with her.  Tm 99.7 now afebrile VSS 99% RA   Filed Vitals:   12/08/13 0344  BP: 115/67  Pulse: 75  Temp: 98.6 F (37 C)  Resp: 18    Physical Exam: Incisions:  All are healing nicely; Extremities:  Left foot warm and left great toe amp healing and sutures in place.  CBC    Component Value Date/Time   WBC 7.8 12/06/2013 1337   RBC 4.71 12/06/2013 1337   HGB 12.3 12/06/2013 1337   HCT 37.6 12/06/2013 1337   PLT 549* 12/06/2013 1337   MCV 79.8 12/06/2013 1337   MCH 26.1 12/06/2013 1337   MCHC 32.7 12/06/2013 1337   RDW 14.5 12/06/2013 1337   LYMPHSABS 1.5 11/21/2013 1222   MONOABS 0.6 11/21/2013 1222   EOSABS 0.2 11/21/2013 1222   BASOSABS 0.0 11/21/2013 1222    BMET    Component Value Date/Time   NA 134* 12/06/2013 1337   NA 138 05/12/2013   K 3.6* 12/06/2013 1337   CL 93* 12/06/2013 1337   CO2 23 12/06/2013 1337   GLUCOSE 162* 12/06/2013 1337   BUN 9 12/06/2013 1337   BUN 6 05/12/2013   CREATININE 0.81 12/06/2013 1337   CREATININE 0.94 05/27/2013 1035   CREATININE 0.8 05/12/2013   CALCIUM 10.1 12/06/2013 1337   GFRNONAA 78* 12/06/2013 1337   GFRAA >90 12/06/2013 1337    INR    Component Value Date/Time   INR 1.07 12/01/2013 1313     Intake/Output Summary (Last 24 hours) at 12/08/13 0717 Last data filed at 12/07/13 1630  Gross per 24 hour  Intake    840 ml  Output      0 ml  Net    840 ml     Assessment:  58 y.o. female is s/p:  Left femoral to below knee popliteal bypass with non-reversed ipsilateral great saphenous vein, left first toe amputation, intraop arteriogram  6 Days Post-Op  Plan: -pt doing well this am -will discharge home with rolling walker.  She states that she has family that will be staying with her.  Will order home PT -discharge home -f/u in 2 weeks   Doreatha Massed, PA-C [facility] [phone] 12/08/2013 7:17 AM

## 2013-12-17 ENCOUNTER — Encounter: Payer: Self-pay | Admitting: Vascular Surgery

## 2013-12-18 ENCOUNTER — Encounter: Payer: Self-pay | Admitting: Vascular Surgery

## 2013-12-18 ENCOUNTER — Ambulatory Visit (INDEPENDENT_AMBULATORY_CARE_PROVIDER_SITE_OTHER): Payer: Self-pay | Admitting: Vascular Surgery

## 2013-12-18 VITALS — BP 135/76 | HR 62 | Temp 98.3°F | Ht 65.5 in | Wt 117.0 lb

## 2013-12-18 DIAGNOSIS — I70269 Atherosclerosis of native arteries of extremities with gangrene, unspecified extremity: Secondary | ICD-10-CM

## 2013-12-18 DIAGNOSIS — I70222 Atherosclerosis of native arteries of extremities with rest pain, left leg: Secondary | ICD-10-CM | POA: Insufficient documentation

## 2013-12-18 NOTE — Progress Notes (Signed)
Patient is a 59 year old female who underwent left femoral to below-knee popliteal bypass and amputation of her left first toe on April 7. She had one vessel peroneal runoff. This was a non-reversed vein graft. She returns for followup today. The rest pain in her left foot has resolved. She has minimal pain in the left first toe amputation. She does not describe claudication.  Physical exam:  Filed Vitals:   12/18/13 1040  BP: 135/76  Pulse: 62  Temp: 98.3 F (36.8 C)  TempSrc: Oral  Height: 5' 5.5" (1.664 m)  Weight: 117 lb (53.071 kg)  SpO2: 100%    Left lower extremity: Healing incisions no drainage left first toe amputation healing Doppler signals in the foot  Assessment: Patent left femoral to below-knee popliteal bypass with healing toe indication  Plan: Followup in 2 weeks to remove sutures from toe amputation site. Half of staples were removed from the left leg today.  Fabienne Bruns, MD [facility] [place] Office: [phone] Pager: [phone]

## 2013-12-30 ENCOUNTER — Ambulatory Visit (INDEPENDENT_AMBULATORY_CARE_PROVIDER_SITE_OTHER): Payer: Medicare Other | Admitting: Internal Medicine

## 2013-12-30 ENCOUNTER — Encounter: Payer: Self-pay | Admitting: Internal Medicine

## 2013-12-30 VITALS — BP 162/82 | HR 67 | Temp 99.4°F | Wt 118.0 lb

## 2013-12-30 DIAGNOSIS — M329 Systemic lupus erythematosus, unspecified: Secondary | ICD-10-CM

## 2013-12-30 DIAGNOSIS — F3289 Other specified depressive episodes: Secondary | ICD-10-CM

## 2013-12-30 DIAGNOSIS — I25810 Atherosclerosis of coronary artery bypass graft(s) without angina pectoris: Secondary | ICD-10-CM

## 2013-12-30 DIAGNOSIS — I70269 Atherosclerosis of native arteries of extremities with gangrene, unspecified extremity: Secondary | ICD-10-CM

## 2013-12-30 DIAGNOSIS — F329 Major depressive disorder, single episode, unspecified: Secondary | ICD-10-CM

## 2013-12-30 MED ORDER — ALPRAZOLAM 0.5 MG PO TABS: 0.5000 mg | ORAL_TABLET | Freq: Two times a day (BID) | ORAL | Status: DC | PRN

## 2013-12-30 NOTE — Progress Notes (Signed)
3/27-Bad pedicure caused gangrene last august. In September diagnosed with gout. and then spread through body. ED for cellulitis. Was taking ciprfloxacin and doxycycline. Stopped when came home from hospital.  11/12/13: Pain worse with I & D. Dr. Katrinka Blazing. And Dr. Quintella Reichert.  12/02/13- Left 1st  MT amputated due to poor circulation and cellulits.    Toe amputated because of gangrene, concern for ostemylitis and ischemia of toe.   Foot is swollen, warm, slightly red and dry. No fever, comes and goes. Kind of depressed now given toe. Gave walker when out of the hospitla. No balance issues. First week out of hospital saw PT and that went well. Still painful worst 10/10. 3/10. Not hurting right. Elevate PRN. Walking hurts it more. It's pain. Hurts until she takes hydrocodone. BP is fine until she comes here. Might be a little anxious. No SOB, CP, weight loss has lost 10 lbs in the past month. Foot. Pain. appetitie is better now. No medication.   Dr. Darrick Penna on Friday.

## 2013-12-30 NOTE — Progress Notes (Signed)
Pre visit review using our clinic review tool, if applicable. No additional management support is needed unless otherwise documented below in the visit note. 

## 2013-12-30 NOTE — Assessment & Plan Note (Signed)
Dx by derm/bx - Dr Stefanie Libel in [facility] ([place]) "skin only" per pt S/p rheum eval Dareen Piano) with dx confirmed 06/2013 reports now (12/2013) "labs say i don't have it" (? Will obtain records to review) despite pt report of "no lupus", was started back on pred (for rash) in addition to ongoing Plaquenil  remote pt of Dr [name] for RA

## 2013-12-30 NOTE — Assessment & Plan Note (Signed)
Hx same -poorly tolerant of remeron 08/2011 Prev associated with weight loss, insomnia and anxiety/rumination - poor concentration Now exacerbated by medical illness, hosp and surg Self medicating with "crown" - reviewed dangers of same with pt Use prn xanax for symptoms and follow up  6-8 weeks, sooner if problems Verified no si/hi Pt offered counseling referral and declines - support offered

## 2013-12-30 NOTE — Patient Instructions (Signed)
It was good to see you today.  We have reviewed your prior records including labs and tests today  Medications reviewed and updated, Use Xanax twice daily as needed for anxiety and irritability symptoms, no other changes  Continue working with other specialists as ongoing  Please schedule followup in 6-8 weeks on mood and medications, call sooner if problems.

## 2013-12-30 NOTE — Assessment & Plan Note (Signed)
Hx NSTEMI 07/2009 - AMI inferior wall 04/2013 with DES to CFX Currently, no anginal symptoms Reminded of need for med compliance and keep follow up with cards as ongoing

## 2013-12-30 NOTE — Assessment & Plan Note (Signed)
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

## 2013-12-30 NOTE — Progress Notes (Signed)
Subjective:    Patient ID: KYNADIE YAUN, female    DOB: [birth year], 58 y.o.   MRN: [id number]  HPI  Patient is here for hospital follow up -  PAD with L fem-pop and 1st toe amputation 12/02/13 (due to gangrene/osteomyelitis) Also reviewed chronic medical issues and interval medical events  Past Medical History  Diagnosis Date  . 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 PAD (peripheral artery disease)     s/p L fem pop 11/2013 in setting of 1st toe osteo/gangrene  . GERD (gastroesophageal reflux disease)   . Dizziness     ? CVA 01/2010 - carotid dopplers with no ICA stenosis  . Arnold-Chiari malformation, type I     MRI brain 02/2010  . Bradycardia   . Tobacco abuse     quit 04/2013  . DEPRESSION   . HYPERLIPIDEMIA   . HYPERTENSION   . CAD (coronary artery disease)     a. NSTEMI 07/2009: LHC - D2 40%, LAD irreg., EF 50% with apical AK (tako-tsubo CM);  b. Inf STEMI (04/2013): Tx Promus DES to CFX;  c. 08/2013 Lexi CL: No ischemia, dist ant/ap/inf-ap infarct, EF   . SLE (systemic lupus erythematosus) dx 05/2013    follows with rheum Dareen Piano)  . Sjogren's syndrome 06/02/13    pt denies this (12/01/13)  . Abnormal LFTs     08/2013 Lipitor 40 d/c'd and crestor 10 started   . Anxiety   . RA (rheumatoid arthritis)     prior tx by Dr. Dareen Piano  . Anemia     as a young woman    Review of Systems  Respiratory: Negative for cough and shortness of breath.   Cardiovascular: Negative for chest pain and leg swelling.  Musculoskeletal: Negative for arthralgias and gait problem.  Skin: Positive for rash (improved). Negative for color change and pallor.  Psychiatric/Behavioral: Positive for sleep disturbance, dysphoric mood and agitation (irritable). Negative for suicidal ideas, behavioral problems, self-injury and decreased concentration. The patient is nervous/anxious.        Objective:   Physical Exam  BP 162/82   Pulse 67  Temp(Src) 99.4 F (37.4 C) (Oral)  Wt 118 lb (53.524 kg)  SpO2 99% Wt Readings from Last 3 Encounters:  12/30/13 118 lb (53.524 kg)  12/18/13 117 lb (53.071 kg)  12/02/13 117 lb 12.8 oz (53.434 kg)   Constitutional: She is thin, nontoxic -appears well-developed and well-nourished. No distress. G-dtr at side  Neck: Normal range of motion. Neck supple. No JVD present. No thyromegaly present.  Cardiovascular: Normal rate, regular rhythm and normal heart sounds.  No murmur heard. No BLE edema. Pulmonary/Chest: Effort normal and breath sounds normal. No respiratory distress. She has no wheezes.MSkel: L foot with diffuse edema but no abn warmth or erythema - L 1st toe amp site c/d/i, sutures in place without infection or drainage Skin - fain purpura L palm and B shins (improved per pt and family since resuming pred x 3 days) Psychiatric: She has a normal mood and affect. Her behavior is normal. Judgment and thought content normal.   Lab Results  Component Value Date   WBC 7.8 12/06/2013   HGB 12.3 12/06/2013   HCT 37.6 12/06/2013   PLT 549* 12/06/2013   GLUCOSE 162* 12/06/2013   CHOL 149 11/23/2013   TRIG 72 11/23/2013   HDL 51 11/23/2013   LDLCALC 84 11/23/2013   ALT 36* 11/21/2013  AST 32 11/21/2013   NA 134* 12/06/2013   K 3.6* 12/06/2013   CL 93* 12/06/2013   CREATININE 0.81 12/06/2013   BUN 9 12/06/2013   CO2 23 12/06/2013   TSH 0.775 11/21/2013   INR 1.07 12/01/2013   HGBA1C 6.4* 12/02/2013    Dg Ang/ext/uni/or Left  12/02/2013   CLINICAL DATA:  Femoral popliteal bypass graft  EXAM: LEFT ANG/EXT/UNI/ OR  COMPARISON:  CT ANGIO CHEST W/CM &/OR WO/CM dated 10/05/2013  FINDINGS: A single spot intraoperative radiographic image of the left lower leg is provided for review.  Contrast has been injected from a more central location.  There appears to be a reversed venous below-knee bypass graft. The distal anastomosis appears widely patent. No evidence of contrast extravasation.  There is a  apparent solitary runoff to the left lower leg, likely via the peroneal with reconstitution of the distal aspects of both the left popliteal and anterior tibial vascular territories. The dorsalis pedis artery is excluded from view. The anterior tibial artery appears to occlude shortly after its origin.  Surgical clips are noted within the superior/mid aspect of the calf. No radiopaque foreign body.  IMPRESSION: Post below-knee popliteal native bypass graft without evidence of complication.   Electronically Signed   By: Simonne Come M.D.   On: 12/02/2013 13:52       Assessment & Plan:   Problem List Items Addressed This Visit   Atherosclerosis of native arteries of the extremities with gangrene - Primary     L great toe with osteo and gangrene, s/p amputation 12/02/13  Also L fem pop (below the knee) 12/02/13 - recovering well and follows woith [facility] for same Pain control with hydrocodone and med mgmt as ongoing (plavix + ASA in place of Brilinta)    CAD      Hx NSTEMI 07/2009 - AMI inferior wall 04/2013 with DES to CFX Currently, no anginal symptoms Reminded of need for med compliance and keep follow up with cards as ongoing    DEPRESSION     Hx same -poorly tolerant of remeron 08/2011 Prev associated with weight loss, insomnia and anxiety/rumination - poor concentration Now exacerbated by medical illness, hosp and surg Self medicating with "crown" - reviewed dangers of same with pt Use prn xanax for symptoms and follow up  6-8 weeks, sooner if problems Verified no si/hi Pt offered counseling referral and declines - support offered    Relevant Medications      ALPRAZolam Prudy Feeler) tablet   SLE (systemic lupus erythematosus)     Dx by derm/bx - Dr Stefanie Libel in [facility] [place] [facility] "skin only" per pt S/p rheum eval Dareen Piano) with dx confirmed 06/2013 reports now (12/2013) "labs say i don't have it" (? Will obtain records to review) despite pt report of "no lupus", was started back on  pred (for rash) in addition to ongoing Plaquenil  remote pt of Dr [name] for RA      Time spent with pt/family today 30 minutes, greater than 50% time spent counseling patient on PAD, SLE, anxiety/depression and medication review. Also review of prior records

## 2013-12-31 ENCOUNTER — Encounter: Payer: Self-pay | Admitting: Vascular Surgery

## 2014-01-01 ENCOUNTER — Ambulatory Visit (INDEPENDENT_AMBULATORY_CARE_PROVIDER_SITE_OTHER): Payer: Self-pay | Admitting: Vascular Surgery

## 2014-01-01 ENCOUNTER — Encounter: Payer: Self-pay | Admitting: Vascular Surgery

## 2014-01-01 VITALS — BP 184/91 | HR 78 | Ht 65.5 in

## 2014-01-01 DIAGNOSIS — R5383 Other fatigue: Secondary | ICD-10-CM

## 2014-01-01 DIAGNOSIS — I70269 Atherosclerosis of native arteries of extremities with gangrene, unspecified extremity: Secondary | ICD-10-CM

## 2014-01-01 DIAGNOSIS — R531 Weakness: Secondary | ICD-10-CM

## 2014-01-01 DIAGNOSIS — R5381 Other malaise: Secondary | ICD-10-CM

## 2014-01-01 DIAGNOSIS — Z48812 Encounter for surgical aftercare following surgery on the circulatory system: Secondary | ICD-10-CM

## 2014-01-01 DIAGNOSIS — M25579 Pain in unspecified ankle and joints of unspecified foot: Secondary | ICD-10-CM

## 2014-01-01 NOTE — Addendum Note (Signed)
Addended by: Sharee Pimple on: 01/01/2014 03:40 PM   Modules accepted: Orders

## 2014-01-01 NOTE — Progress Notes (Signed)
Patient is a 58 year old female who underwent left femoral to below-knee popliteal bypass and amputation of her left first toe on April 7. She had one vessel peroneal runoff. This was a non-reversed vein graft. She returns for followup today. The rest pain in her left foot has resolved. She has minimal pain in the left first toe amputation. She does not describe claudication. Unfortunately she fell yesterday. She's had significant pain on palpation on the lateral aspect of her left foot and some bruising.  Physical exam:    Filed Vitals:   01/01/14 1426  BP: 184/91  Pulse: 78  Height: 5' 5.5" (1.664 m)  SpO2: 100%    Left lower extremity: Healing incisions no drainage left first toe amputation healing, palpable popliteal pulse, some bruising of the fourth and fifth toe and lateral foot Doppler signals in the foot, edema of the ankle and foot with pain on palpation of the lateral foot over the fifth metatarsal  Assessment: Patent left femoral to below-knee popliteal bypass with healing toe amputation.  Plan: Sutures were removed from toe today. We will also schedule her for a left foot x-ray to make sure she did not sustain a fracture during her fall. She will followup in July she has no fracture of her foot. She will have a graft duplex and ABIs at that time.  She was given a prescription for a walking cane today also.  Fabienne Bruns, MD [facility] Office: [phone] Pager: [phone]

## 2014-01-02 ENCOUNTER — Ambulatory Visit
Admission: RE | Admit: 2014-01-02 | Discharge: 2014-01-02 | Disposition: A | Discharge status: Home / Self Care | Payer: Medicare Other | Source: Ambulatory Visit | Attending: Vascular Surgery | Admitting: Vascular Surgery

## 2014-01-02 ENCOUNTER — Ambulatory Visit (INDEPENDENT_AMBULATORY_CARE_PROVIDER_SITE_OTHER): Payer: Medicare Other | Admitting: Nurse Practitioner

## 2014-01-02 ENCOUNTER — Encounter: Payer: Self-pay | Admitting: Nurse Practitioner

## 2014-01-02 VITALS — BP 150/70 | HR 71 | Ht 65.5 in | Wt 119.8 lb

## 2014-01-02 DIAGNOSIS — E785 Hyperlipidemia, unspecified: Secondary | ICD-10-CM

## 2014-01-02 DIAGNOSIS — I2510 Atherosclerotic heart disease of native coronary artery without angina pectoris: Secondary | ICD-10-CM

## 2014-01-02 DIAGNOSIS — M25579 Pain in unspecified ankle and joints of unspecified foot: Secondary | ICD-10-CM

## 2014-01-02 DIAGNOSIS — I10 Essential (primary) hypertension: Secondary | ICD-10-CM

## 2014-01-02 NOTE — Progress Notes (Signed)
Mercedes [name] Date of Birth: November 21, 1955 Medical Record #[id number]  History of Present Illness: Mercedes [name] is seen back today for a post hospital visit. Seen for Dr. Jens Som. She is a 58 year old female with past tobacco abuse, CAD with Takotsubo CM in 2010, inferior STEMI in 04/2013 with DES/PCI of the LCX, HTN, HLD, PVD, Sjogren's syndrome, SLE and osteomyelitis. She has not tolerated Brilinta and has been switched to Plavix. Follow up Myoview from January of 2015 showed no ischemia.   Last seen by Dr. Jens Som in January of 2015 - noted that she was smoking a cigarette or two.   Most recently presented to the hospital with osteomyelitis of her left great toe - has had amputation of the toe alone with left femoropopliteal bypass per Dr. Darrick Penna. Post op course appeared uncomplicated except for N,V.  Comes in today. Here with her grand daughter. She is doing well. No chest pain. Not short of breath. Denies smoking. Has had a fall - tripped over a cord - for Xray of her left foot - going to do that after she leaves here. Transitioning to a cane. Tolerating her medicines. No more GI issues with the Plavix. Feels good overall and is happy with how she is doing.    Current Outpatient Prescriptions  Medication Sig Dispense Refill  . aspirin 81 MG tablet Take 1 tablet (81 mg total) by mouth daily.  30 tablet    . atorvastatin (LIPITOR) 40 MG tablet Take 40 mg by mouth daily.      . clopidogrel (PLAVIX) 75 MG tablet Take 1 tablet (75 mg total) by mouth daily with breakfast.  30 tablet  4  . HYDROcodone-acetaminophen (NORCO/VICODIN) 5-325 MG per tablet Take 1 tablet by mouth every 6 (six) hours as needed for moderate pain or severe pain.  30 tablet  0  . hydroxychloroquine (PLAQUENIL) 200 MG tablet Take 200 mg by mouth daily.      [name] lisinopril (PRINIVIL,ZESTRIL) 20 MG tablet Take 1 tablet (20 mg total) by mouth daily.  90 tablet  3  . nitroGLYCERIN (NITROSTAT) 0.4 MG SL tablet Place 0.4 mg under  the tongue every 5 (five) minutes as needed for chest pain.      . predniSONE (DELTASONE) 10 MG tablet Take 2 tablets (20 mg total) by mouth daily with breakfast.      . ALPRAZolam (XANAX) 0.5 MG tablet Take 1 tablet (0.5 mg total) by mouth 2 (two) times daily as needed for anxiety or sleep.  40 tablet  1   No current facility-administered medications for this visit.    Allergies  Allergen Reactions  . Brilinta [Ticagrelor] Shortness Of Breath    Also chest tightness    Past Medical History  Diagnosis Date  . 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 PAD (peripheral artery disease)     s/p L fem pop 11/2013 in setting of 1st toe osteo/gangrene  . GERD (gastroesophageal reflux disease)   . Dizziness     ? CVA 01/2010 - carotid dopplers with no ICA stenosis  . Arnold-Chiari malformation, type I     MRI brain 02/2010  . Bradycardia   . Tobacco abuse     quit 04/2013  . DEPRESSION   . HYPERLIPIDEMIA   . HYPERTENSION   . CAD (coronary artery disease)     a. NSTEMI 07/2009: LHC - D2 40%, LAD irreg., EF 50%  with apical AK (tako-tsubo CM);  b. Inf STEMI (04/2013): Tx Promus DES to CFX;  c. 08/2013 Lexi CL: No ischemia, dist ant/ap/inf-ap infarct, EF   . SLE (systemic lupus erythematosus) dx 05/2013    follows with rheum Dareen Piano)  . Sjogren's syndrome 06/02/13    pt denies this (12/01/13)  . Abnormal LFTs     08/2013 Lipitor 40 d/c'd and crestor 10 started   . Anxiety   . RA (rheumatoid arthritis)     prior tx by Dr. Dareen Piano  . Anemia     as a young woman    Past Surgical History  Procedure Laterality Date  . Coronary angioplasty  04/2013  . Tubal ligation    . Colonoscopy    . Femoral-popliteal bypass graft Left 12/02/2013    Procedure:  LEFT FEMORAL-BELOW KNEE POPLITEAL ARTERY BYPASS GRAFT;  Surgeon: Sherren Kerns, MD;  Location[facility];  Service: Vascular;  Laterality: Left;  . Amputation Left 12/02/2013     Procedure: LEFT 1ST TOE AMPUTATION;  Surgeon: Sherren Kerns, MD;  Location: [facility] OR;  Service: Vascular;  Laterality: Left;    History  Smoking status  . Former Smoker  . Types: Cigarettes  . Quit date: 05/19/2013  Smokeless tobacco  . Never Used    Comment:      History  Alcohol Use  . Yes    Comment: i DONT DRINK ANYMORE"    Family History  Problem Relation Age of Onset  . Arthritis Mother   . Emphysema Mother   . Arthritis Father   . COPD Father   . Diabetes Other     Review of Systems: The review of systems is per the HPI.  All other systems were reviewed and are negative.  Physical Exam: BP 150/70  Pulse 71  Ht 5' 5.5" (1.664 m)  Wt 119 lb 12.8 oz (54.341 kg)  BMI 19.63 kg/m2  SpO2 100% Patient is very pleasant and in no acute distress. She is using a walker. Skin is warm and dry. Color is normal.  HEENT is unremarkable. Normocephalic/atraumatic. PERRL. Sclera are nonicteric. Neck is supple. No masses. No JVD. Lungs are clear. Cardiac exam shows a regular rate and rhythm. Abdomen is soft. Extremities are without edema. Gait and ROM are intact. She is using a walker. Boot on the left foot noted.  No gross neurologic deficits noted.   LABORATORY DATA: Lab Results  Component Value Date   WBC 7.8 12/06/2013   HGB 12.3 12/06/2013   HCT 37.6 12/06/2013   PLT 549* 12/06/2013   GLUCOSE 162* 12/06/2013   CHOL 149 11/23/2013   TRIG 72 11/23/2013   HDL 51 11/23/2013   LDLCALC 84 11/23/2013   ALT 36* 11/21/2013   AST 32 11/21/2013   NA 134* 12/06/2013   K 3.6* 12/06/2013   CL 93* 12/06/2013   CREATININE 0.81 12/06/2013   BUN 9 12/06/2013   CO2 23 12/06/2013   TSH 0.775 11/21/2013   INR 1.07 12/01/2013   HGBA1C 6.4* 12/02/2013     Assessment / Plan: 1. CAD - past MI/PCI - on Plavix - doing well clinically.   2. PVD - with recent toe amputation and left femoropopliteal bypass - followed by Dr. Darrick Penna. Has had recent fall and going for Xray after her visit here today.  3. HTN  - BP better at home - I have asked her to continue to monitor.   4. HLD - on statin  5. Tobacco abuse - denies  smoking at this time.   See Dr. Jens Som in 6 months. No change in current therapy.   Patient is agreeable to this plan and will call if any problems develop in the interim.   Rosalio Macadamia, RN, ANP-C [facility] [address] [place], [place]  [address] [phone]

## 2014-01-02 NOTE — Patient Instructions (Signed)
I think you are doing well  Stay on your current medicines  Change recall visit with Dr. Jens Som for 6 months  Call the [facility] office at [phone] if you have any questions, problems or concerns.

## 2014-02-23 ENCOUNTER — Other Ambulatory Visit: Payer: Self-pay | Admitting: Internal Medicine

## 2014-04-01 ENCOUNTER — Encounter: Payer: Self-pay | Admitting: Vascular Surgery

## 2014-04-02 ENCOUNTER — Ambulatory Visit (HOSPITAL_COMMUNITY)
Admission: RE | Admit: 2014-04-02 | Discharge: 2014-04-02 | Disposition: A | Discharge status: Home / Self Care | Payer: Medicare Other | Source: Ambulatory Visit | Attending: Vascular Surgery | Admitting: Vascular Surgery

## 2014-04-02 ENCOUNTER — Ambulatory Visit (INDEPENDENT_AMBULATORY_CARE_PROVIDER_SITE_OTHER): Payer: Medicare Other | Admitting: Vascular Surgery

## 2014-04-02 ENCOUNTER — Ambulatory Visit (INDEPENDENT_AMBULATORY_CARE_PROVIDER_SITE_OTHER)
Admission: RE | Admit: 2014-04-02 | Discharge: 2014-04-02 | Disposition: A | Discharge status: Home / Self Care | Payer: Medicare Other | Source: Ambulatory Visit | Attending: Vascular Surgery | Admitting: Vascular Surgery

## 2014-04-02 ENCOUNTER — Encounter: Payer: Self-pay | Admitting: Vascular Surgery

## 2014-04-02 VITALS — BP 131/77 | HR 64 | Ht 65.5 in | Wt 117.6 lb

## 2014-04-02 DIAGNOSIS — Z48812 Encounter for surgical aftercare following surgery on the circulatory system: Secondary | ICD-10-CM

## 2014-04-02 DIAGNOSIS — I70269 Atherosclerosis of native arteries of extremities with gangrene, unspecified extremity: Secondary | ICD-10-CM

## 2014-04-02 NOTE — Progress Notes (Signed)
Patient is a 58 year old female who underwent left femoral to below-knee popliteal bypass and amputation of her left first toe on December 02 2013. She had one vessel peroneal runoff. This was a non-reversed vein graft. She returns for followup today. The rest pain in her left foot has resolved. She has no pain in the left first toe amputation. She does not describe claudication. Unfortunately she continues to smoke. She was counseled against this today.   Physical exam:  Filed Vitals:   04/02/14 1440  BP: 131/77  Pulse: 64  Height: 5' 5.5" (1.664 m)  Weight: 117 lb 9.6 oz (53.343 kg)  SpO2: 100%   Left lower extremity: Healed incisions left first toe amputation healed, palpable popliteal pulse  Doppler signals in the foot  Data: Patient is a duplex ultrasound of her bypass graft today. This showed no significant narrowing. There is patent left femoropopliteal bypass. ABI on the left side was 0.95. ABI on the right 0.70.  Assessment: Patent left femoral to below-knee popliteal bypass with healed toe amputation.  Plan: Followup 3 months graft duplex scan and ABIs.  Fabienne Bruns, MD [facility] Office: [phone] Pager: [phone]

## 2014-05-16 IMAGING — CR DG FOOT 2V*L*
2 series · 2 of 2 positions shown · non-contrast
Comparison: 11/21/2013

CLINICAL DATA: Left foot pain since a fall on 12/31/2013.
Amputation of the great toe 03/10/2014.

EXAM:
LEFT FOOT - 2 VIEW

[view not recorded (1 of 2)]
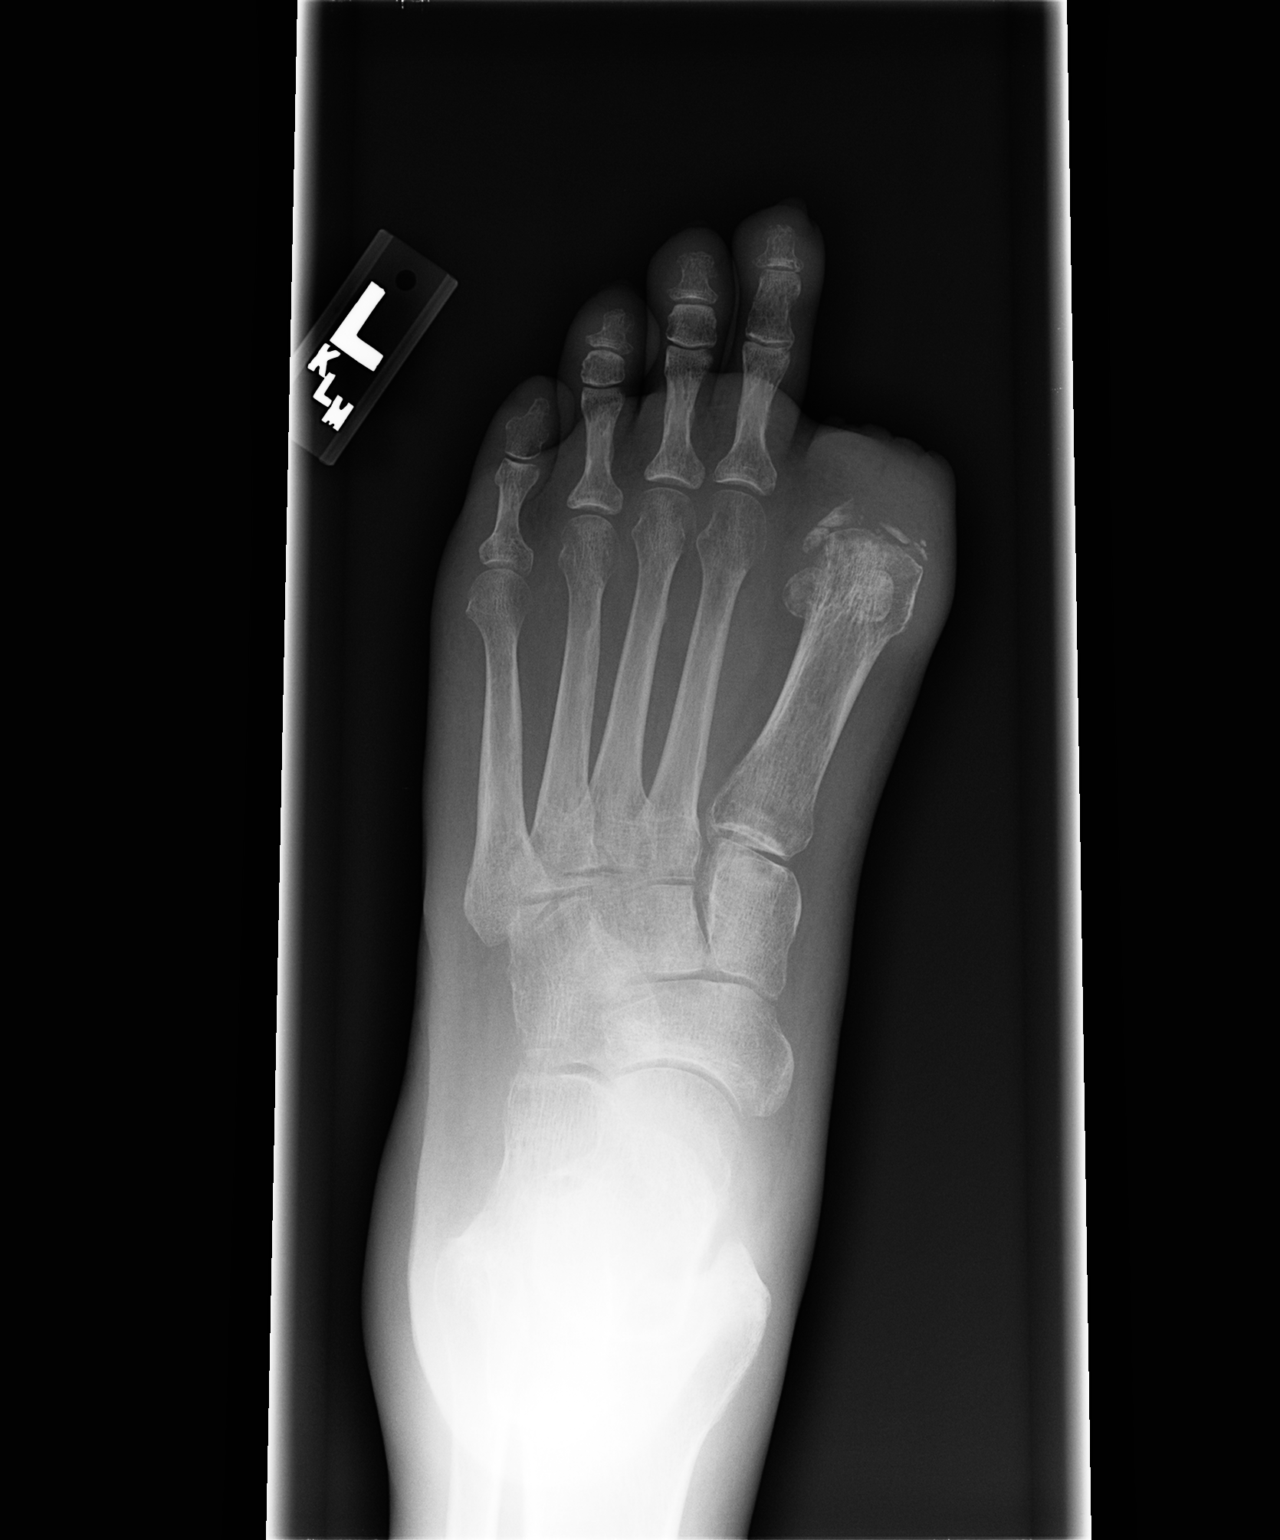

[view not recorded (2 of 2)]
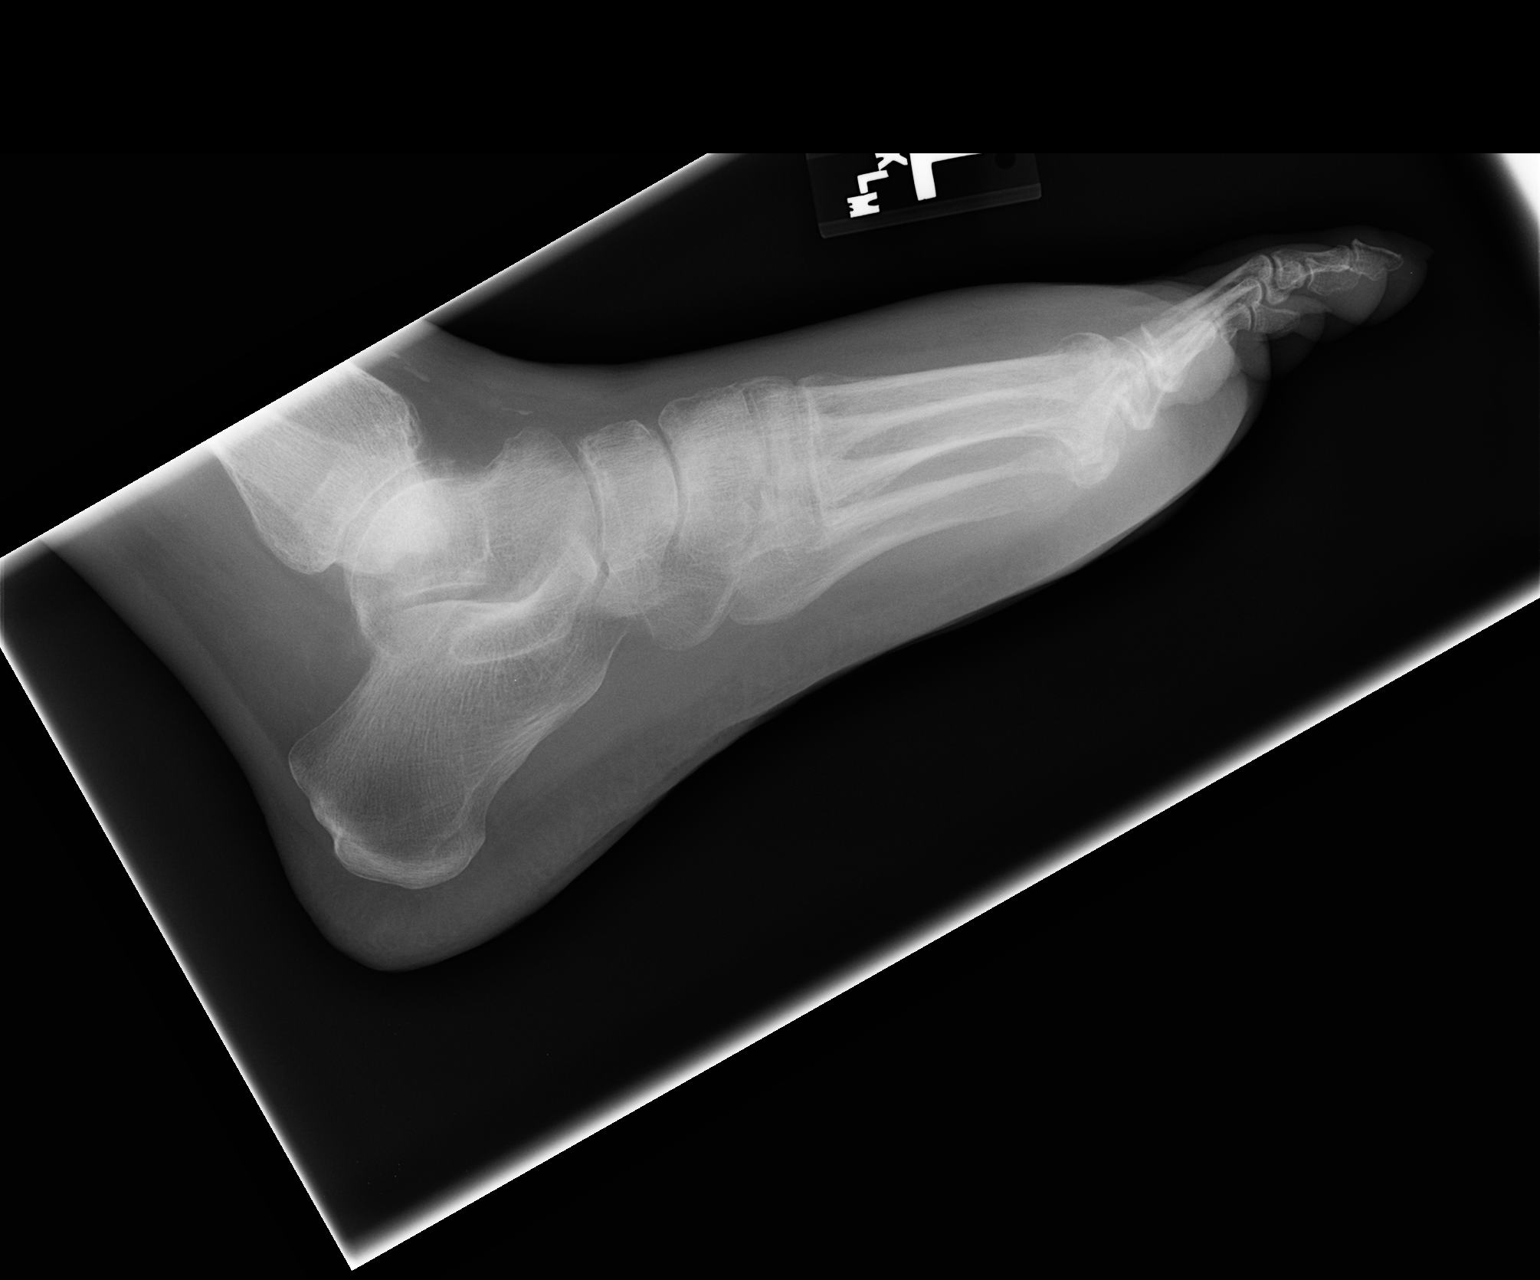

[2 of 2 positions shown; findings below may reference images not displayed]

FINDINGS: There is no fracture or dislocation. The great toe has been
amputated. There are a few bone fragments adjacent to the head of
the first metatarsal.
IMPRESSION: No acute osseous abnormality. Postsurgical changes at head of the
first metatarsal.

## 2014-07-02 ENCOUNTER — Encounter (HOSPITAL_COMMUNITY): Payer: Medicare Other

## 2014-07-02 ENCOUNTER — Ambulatory Visit: Payer: Medicare Other | Admitting: Vascular Surgery

## 2014-07-02 ENCOUNTER — Other Ambulatory Visit (HOSPITAL_COMMUNITY): Payer: Medicare Other

## 2014-07-14 ENCOUNTER — Encounter: Payer: Self-pay | Admitting: Vascular Surgery

## 2014-07-15 ENCOUNTER — Encounter: Payer: Self-pay | Admitting: Vascular Surgery

## 2014-07-15 ENCOUNTER — Ambulatory Visit (INDEPENDENT_AMBULATORY_CARE_PROVIDER_SITE_OTHER): Payer: Medicare Other | Admitting: Vascular Surgery

## 2014-07-15 ENCOUNTER — Ambulatory Visit (INDEPENDENT_AMBULATORY_CARE_PROVIDER_SITE_OTHER)
Admission: RE | Admit: 2014-07-15 | Discharge: 2014-07-15 | Disposition: A | Discharge status: Home / Self Care | Payer: Medicare Other | Source: Ambulatory Visit | Attending: Vascular Surgery | Admitting: Vascular Surgery

## 2014-07-15 ENCOUNTER — Ambulatory Visit (HOSPITAL_COMMUNITY)
Admission: RE | Admit: 2014-07-15 | Discharge: 2014-07-15 | Disposition: A | Discharge status: Home / Self Care | Payer: Medicare Other | Source: Ambulatory Visit | Attending: Vascular Surgery | Admitting: Vascular Surgery

## 2014-07-15 VITALS — BP 149/89 | HR 69 | Resp 14 | Ht 66.0 in | Wt 115.0 lb

## 2014-07-15 DIAGNOSIS — I70269 Atherosclerosis of native arteries of extremities with gangrene, unspecified extremity: Secondary | ICD-10-CM

## 2014-07-15 DIAGNOSIS — Z48812 Encounter for surgical aftercare following surgery on the circulatory system: Secondary | ICD-10-CM | POA: Diagnosis not present

## 2014-07-15 DIAGNOSIS — I739 Peripheral vascular disease, unspecified: Secondary | ICD-10-CM

## 2014-07-15 NOTE — Addendum Note (Signed)
Addended by: Sharee Pimple on: 07/15/2014 04:10 PM   Modules accepted: Orders

## 2014-07-15 NOTE — Addendum Note (Signed)
Addended by: Sharee Pimple on: 07/15/2014 04:08 PM   Modules accepted: Orders

## 2014-07-15 NOTE — Progress Notes (Signed)
Patient is a 58 year old female who underwent left femoral to below-knee popliteal bypass and amputation of her left first toe on December 02 2013. She had one vessel peroneal runoff. This was a non-reversed vein graft. She returns for followup today. The rest pain in her left foot has resolved. She has no pain in the left first toe amputation. She does not describe claudication. Unfortunately she continues to smoke. She was counseled against this today.   Physical exam:    Filed Vitals:   07/15/14 1436  BP: 149/89  Pulse: 69  Resp: 14  Height: 5\' 6"  (1.676 m)  Weight: 115 lb (52.164 kg)    Left lower extremity: Healed incisions left first toe amputation healed, palpable popliteal pulse  Doppler signals in the foot  Data: Patient had a duplex ultrasound of her bypass graft today. This showed no significant narrowing. There is a patent left femoropopliteal bypass. ABI on the left side was 0.99. ABI on the right 0.55.  Assessment: Patent left femoral to below-knee popliteal bypass with healed toe amputation.  Plan: Followup 3 months graft duplex scan and ABIs.  Smoking cessation was again emphasized.  We will also refer her to podiatry for trimming of her toenails.  , MD [facility] Office: [phone] Pager: [phone]

## 2014-08-06 ENCOUNTER — Encounter (HOSPITAL_COMMUNITY): Payer: Self-pay | Admitting: Cardiovascular Disease

## 2014-09-01 ENCOUNTER — Telehealth: Payer: Self-pay

## 2014-09-01 NOTE — Telephone Encounter (Signed)
Called pt and pt stated that they have not had a colonoscopy or mammogram.

## 2014-10-21 ENCOUNTER — Encounter: Payer: Self-pay | Admitting: Family

## 2014-10-22 ENCOUNTER — Ambulatory Visit: Payer: Medicare Other | Admitting: Family

## 2014-10-22 ENCOUNTER — Other Ambulatory Visit (HOSPITAL_COMMUNITY): Payer: Self-pay

## 2014-10-22 ENCOUNTER — Encounter (HOSPITAL_COMMUNITY): Payer: Medicare Other

## 2014-10-23 ENCOUNTER — Ambulatory Visit (INDEPENDENT_AMBULATORY_CARE_PROVIDER_SITE_OTHER)
Admission: RE | Admit: 2014-10-23 | Discharge: 2014-10-23 | Disposition: A | Discharge status: Home / Self Care | Payer: Medicare Other | Source: Ambulatory Visit | Attending: Family | Admitting: Family

## 2014-10-23 ENCOUNTER — Encounter: Payer: Self-pay | Admitting: Family

## 2014-10-23 ENCOUNTER — Ambulatory Visit (HOSPITAL_COMMUNITY)
Admission: RE | Admit: 2014-10-23 | Discharge: 2014-10-23 | Disposition: A | Discharge status: Home / Self Care | Payer: Medicare Other | Source: Ambulatory Visit | Attending: Family | Admitting: Family

## 2014-10-23 ENCOUNTER — Ambulatory Visit (INDEPENDENT_AMBULATORY_CARE_PROVIDER_SITE_OTHER): Payer: Medicare Other | Admitting: Family

## 2014-10-23 VITALS — BP 154/98 | HR 67 | Resp 16 | Ht 66.0 in | Wt 117.0 lb

## 2014-10-23 DIAGNOSIS — Z87891 Personal history of nicotine dependence: Secondary | ICD-10-CM

## 2014-10-23 DIAGNOSIS — I739 Peripheral vascular disease, unspecified: Secondary | ICD-10-CM | POA: Diagnosis not present

## 2014-10-23 DIAGNOSIS — Z95828 Presence of other vascular implants and grafts: Secondary | ICD-10-CM

## 2014-10-23 DIAGNOSIS — Z9889 Other specified postprocedural states: Secondary | ICD-10-CM

## 2014-10-23 DIAGNOSIS — Z48812 Encounter for surgical aftercare following surgery on the circulatory system: Secondary | ICD-10-CM

## 2014-10-23 NOTE — Progress Notes (Signed)
[facility] [place] HISTORY AND PHYSICAL -PAD  History of Present Illness Mercedes [name] is a 59 y.o. female patient of Dr. Darrick Penna who is s/p left femoral to below-knee popliteal bypass and amputation of her left first toe on December 02 2013. She had one vessel peroneal runoff. This was a non-reversed vein graft. She returns for followup today. The rest pain in her left foot has resolved. She has no pain in the left first toe amputation. She has right calf claudication after walking about 100-200 feet, relieved by rest. Pt states her blood pressure at home is about 115/74 ; is elevated now.  Pt denies any known history of stroke or TIA, does report 2 MI's. The patient denies New Medical or Surgical History.  Pt Diabetic: No Pt smoker: former smoker, quit 3 weeks ago  Pt meds include: Statin :No, she states she will let Dr. Jens Som know when she sees him in March, 2016 ASA: Yes Other anticoagulants/antiplatelets: not taking Plavix, states that was stopped by one of her medical providers  Past Medical History  Diagnosis Date  . 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 PAD (peripheral artery disease)     s/p L fem pop 11/2013 in setting of 1st toe osteo/gangrene  . GERD (gastroesophageal reflux disease)   . Dizziness     ? CVA 01/2010 - carotid dopplers with no ICA stenosis  . Arnold-Chiari malformation, type I     MRI brain 02/2010  . Bradycardia   . Tobacco abuse     quit 04/2013  . DEPRESSION   . HYPERLIPIDEMIA   . HYPERTENSION   . CAD (coronary artery disease)     a. NSTEMI 07/2009: LHC - D2 40%, LAD irreg., EF 50% with apical AK (tako-tsubo CM);  b. Inf STEMI (04/2013): Tx Promus DES to CFX;  c. 08/2013 Lexi CL: No ischemia, dist ant/ap/inf-ap infarct, EF   . SLE (systemic lupus erythematosus) dx 05/2013    follows with rheum Dareen Piano)  . Sjogren's syndrome 06/02/13    pt denies this  (12/01/13)  . Abnormal LFTs     08/2013 Lipitor 40 d/c'd and crestor 10 started   . Anxiety   . RA (rheumatoid arthritis)     prior tx by Dr. Dareen Piano  . Anemia     as a young woman  . Myocardial infarction     Social History History  Substance Use Topics  . Smoking status: Former Smoker    Types: Cigarettes    Quit date: 05/19/2013  . Smokeless tobacco: Never Used     Comment:    . Alcohol Use: 0.0 oz/week    0 Standard drinks or equivalent per week     Comment: i DONT DRINK ANYMORE"    Family History Family History  Problem Relation Age of Onset  . Arthritis Mother   . Emphysema Mother   . Arthritis Father   . COPD Father   . Diabetes Other     Past Surgical History  Procedure Laterality Date  . Coronary angioplasty  04/2013  . Tubal ligation    . Colonoscopy    . Femoral-popliteal bypass graft Left 12/02/2013    Procedure:  LEFT FEMORAL-BELOW KNEE POPLITEAL ARTERY BYPASS GRAFT;  Surgeon: Sherren Kerns, MD;  Location: [facility] OR;  Service: Vascular;  Laterality: Left;  . Amputation Left 12/02/2013    Procedure: LEFT 1ST TOE AMPUTATION;  Surgeon: Sherren Kerns, MD;  Location: [facility] OR;  Service: Vascular;  Laterality: Left;  . Left heart catheterization with coronary angiogram N/A 05/19/2013    Procedure: LEFT HEART CATHETERIZATION WITH CORONARY ANGIOGRAM;  Surgeon: Micheline Chapman, MD;  Location: [facility] CATH LAB;  Service: Cardiovascular;  Laterality: N/A;  . Percutaneous coronary stent intervention (pci-s)  05/19/2013    Procedure: PERCUTANEOUS CORONARY STENT INTERVENTION (PCI-S);  Surgeon: Micheline Chapman, MD;  Location: [facility] CATH LAB;  Service: Cardiovascular;;  . Abdominal aortagram N/A 11/24/2013    Procedure: ABDOMINAL AORTAGRAM WITH BILATERAL RUNOFF WITH POSSIBLE INTERVENTION;  Surgeon: Chuck Hint, MD;  Location: [facility] CATH LAB;  Service: Cardiovascular;  Laterality: N/A;    Allergies  Allergen Reactions  . Brilinta [Ticagrelor] Shortness Of Breath    Also chest  tightness    Current Outpatient Prescriptions  Medication Sig Dispense Refill  . aspirin 81 MG tablet Take 1 tablet (81 mg total) by mouth daily. 30 tablet   . nitroGLYCERIN (NITROSTAT) 0.4 MG SL tablet Place 0.4 mg under the tongue every 5 (five) minutes as needed for chest pain.    [name] ALPRAZolam (XANAX) 0.5 MG tablet Take 1 tablet (0.5 mg total) by mouth 2 (two) times daily as needed for anxiety or sleep. (Patient not taking: Reported on 10/23/2014) 40 tablet 1  . atorvastatin (LIPITOR) 40 MG tablet Take 40 mg by mouth daily.    . clopidogrel (PLAVIX) 75 MG tablet Take 1 tablet (75 mg total) by mouth daily with breakfast. (Patient not taking: Reported on 10/23/2014) 30 tablet 4  . HYDROcodone-acetaminophen (NORCO/VICODIN) 5-325 MG per tablet Take 1 tablet by mouth every 6 (six) hours as needed for moderate pain or severe pain. (Patient not taking: Reported on 10/23/2014) 30 tablet 0  . hydroxychloroquine (PLAQUENIL) 200 MG tablet Take 200 mg by mouth daily.    [name] lisinopril (PRINIVIL,ZESTRIL) 20 MG tablet TAKE 1 TABLET BY MOUTH DAILY (Patient not taking: Reported on 10/23/2014) 30 tablet 5  . predniSONE (DELTASONE) 10 MG tablet Take 2 tablets (20 mg total) by mouth daily with breakfast.     No current facility-administered medications for this visit.    ROS: See HPI for pertinent positives and negatives.   Physical Examination  Filed Vitals:   10/23/14 1148  BP: 154/98  Pulse: 67  Resp: 16  Height: 5\' 6"  (1.676 m)  Weight: 117 lb (53.071 kg)  SpO2: 100%   Body mass index is 18.89 kg/(m^2).  General: A&O x 3, WDWN. Gait: normal Eyes: PERRLA. Pulmonary: CTAB, without wheezes , rales or rhonchi. Cardiac: regular Rythm , without detected murmur.         Carotid Bruits Right Left   Negative Negative  Aorta is not palpable. Radial pulses: are 2+ palpable  And =                           VASCULAR EXAM: Extremities without ischemic changes  without Gangrene; without open wounds.  Left great toe is surgically absent.  LE Pulses Right Left       FEMORAL   palpable   palpable        POPLITEAL  not palpable   not palpable       POSTERIOR TIBIAL  not palpable   not palpable        DORSALIS PEDIS      ANTERIOR TIBIAL not palpable  faintly palpable    Abdomen: soft, NT, no palpable masses. Skin: no rashes, no ulcers. Musculoskeletal: no muscle wasting or atrophy.  Neurologic: A&O X 3; Appropriate Affect ; SENSATION: normal; MOTOR FUNCTION:  moving all extremities equally, motor strength 5/5 throughout. Speech is fluent/normal. CN 2-12 intact.    Non-Invasive Vascular Imaging: DATE: 10/23/2014 LOWER EXTREMITY ARTERIAL DUPLEX EVALUATION    INDICATION: Follow up bypass graft     PREVIOUS INTERVENTION(S): Left femoral to below knee popliteal bypass graft on 12/02/13    DUPLEX EXAM:     RIGHT  LEFT   Peak Systolic Velocity (cm/s) Ratio (if abnormal) Waveform  Peak Systolic Velocity (cm/s) Ratio (if abnormal) Waveform     Inflow Artery 143  B     Proximal Anastomosis 129  B     Proximal Graft 49  B     Mid Graft 49  B      Distal Graft 50  B     Distal Anastomosis 65  B     Outflow Artery 90  B  0.61 Today's ABI / TBI 0.94  0.55 Previous ABI / TBI (07/15/14 ) 0.99    Waveform:    M - Monophasic       B - Biphasic       T - Triphasic  If Ankle Brachial Index (ABI) or Toe Brachial Index (TBI) performed, please see complete report     ADDITIONAL FINDINGS: No internal vessel narrowing noted within the bypass graft or anastomosis.    IMPRESSION: Patent left leg bypass graft with no evidence of stenosis noted.    Compared to the previous exam:  No significant change noted when compared to the exam on 07/15/14.       ASSESSMENT: DAKIA SCHIFANO is a 59 y.o. female who is s/p left femoral to below-knee popliteal bypass and amputation of her left first  toe on December 02 2013. She had one vessel peroneal runoff. This was a non-reversed vein graft. She returns for followup today. The rest pain in her left foot has resolved. She has no pain in the left first toe amputation. She has right calf claudication after walking about 100-200 feet, relieved by rest. She has no tissue loss.  Today's left LE arterial Duplex reveals a patent left leg bypass graft with no evidence of stenosis noted. No significant change noted when compared to the exam on 07/15/14. ABI's are slightly improved in both legs, monophasic waveforms in the right leg, biphasic in the left leg.  Fortunately she stopped smoking 3 weeks ago and was encouraged to remain smoke free.   PLAN:  I discussed in depth with the patient the nature of atherosclerosis, and emphasized the importance of maximal medical management including strict control of blood pressure, blood glucose, and lipid levels, obtaining regular exercise, and continued cessation of smoking.  The patient is aware that without maximal medical management the underlying atherosclerotic disease process will progress, limiting the benefit of any interventions.  Based on the patient's vascular studies and examination, pt will return to clinic in 6 months for ABI's and left LE arterial  Duplex, she knows to return sooner if needed.  The patient was given information about PAD including signs, symptoms, treatment, what symptoms should prompt the patient to seek immediate medical care, and risk reduction measures to take.  Charisse March, RN, MSN, FNP-C [facility] [place] Phone: [phone]  Clinic MD: Imogene Burn  10/23/2014 12:02 PM

## 2014-10-23 NOTE — Patient Instructions (Signed)
Peripheral Vascular Disease Peripheral Vascular Disease (PVD), also called Peripheral Arterial Disease (PAD), is a circulation problem caused by cholesterol (atherosclerotic plaque) deposits in the arteries. PVD commonly occurs in the lower extremities (legs) but it can occur in other areas of the body, such as your arms. The cholesterol buildup in the arteries reduces blood flow which can cause pain and other serious problems. The presence of PVD can place a person at risk for Coronary Artery Disease (CAD).  CAUSES  Causes of PVD can be many. It is usually associated with more than one risk factor such as:   High Cholesterol.  Smoking.  Diabetes.  Lack of exercise or inactivity.  High blood pressure (hypertension).  Obesity.  Family history. SYMPTOMS   When the lower extremities are affected, patients with PVD may experience:  Leg pain with exertion or physical activity. This is called INTERMITTENT CLAUDICATION. This may present as cramping or numbness with physical activity. The location of the pain is associated with the level of blockage. For example, blockage at the abdominal level (distal abdominal aorta) may result in buttock or hip pain. Lower leg arterial blockage may result in calf pain.  As PVD becomes more severe, pain can develop with less physical activity.  In people with severe PVD, leg pain may occur at rest.  Other PVD signs and symptoms:  Leg numbness or weakness.  Coldness in the affected leg or foot, especially when compared to the other leg.  A change in leg color.  Patients with significant PVD are more prone to ulcers or sores on toes, feet or legs. These may take longer to heal or may reoccur. The ulcers or sores can become infected.  If signs and symptoms of PVD are ignored, gangrene may occur. This can result in the loss of toes or loss of an entire limb.  Not all leg pain is related to PVD. Other medical conditions can cause leg pain such  as:  Blood clots (embolism) or Deep Vein Thrombosis.  Inflammation of the blood vessels (vasculitis).  Spinal stenosis. DIAGNOSIS  Diagnosis of PVD can involve several different types of tests. These can include:  Pulse Volume Recording Method (PVR). This test is simple, painless and does not involve the use of X-rays. PVR involves measuring and comparing the blood pressure in the arms and legs. An ABI (Ankle-Brachial Index) is calculated. The normal ratio of blood pressures is 1. As this number becomes smaller, it indicates more severe disease.  < 0.95 - indicates significant narrowing in one or more leg vessels.  <0.8 - there will usually be pain in the foot, leg or buttock with exercise.  <0.4 - will usually have pain in the legs at rest.  <0.25 - usually indicates limb threatening PVD.  Doppler detection of pulses in the legs. This test is painless and checks to see if you have a pulses in your legs/feet.  A dye or contrast material (a substance that highlights the blood vessels so they show up on x-ray) may be given to help your caregiver better see the arteries for the following tests. The dye is eliminated from your body by the kidney's. Your caregiver may order blood work to check your kidney function and other laboratory values before the following tests are performed:  Magnetic Resonance Angiography (MRA). An MRA is a picture study of the blood vessels and arteries. The MRA machine uses a large magnet to produce images of the blood vessels.  Computed Tomography Angiography (CTA). A CTA   is a specialized x-ray that looks at how the blood flows in your blood vessels. An IV may be inserted into your arm so contrast dye can be injected.  Angiogram. Is a procedure that uses x-rays to look at your blood vessels. This procedure is minimally invasive, meaning a small incision (cut) is made in your groin. A small tube (catheter) is then inserted into the artery of your groin. The catheter  is guided to the blood vessel or artery your caregiver wants to examine. Contrast dye is injected into the catheter. X-rays are then taken of the blood vessel or artery. After the images are obtained, the catheter is taken out. TREATMENT  Treatment of PVD involves many interventions which may include:  Lifestyle changes:  Quitting smoking.  Exercise.  Following a low fat, low cholesterol diet.  Control of diabetes.  Foot care is very important to the PVD patient. Good foot care can help prevent infection.  Medication:  Cholesterol-lowering medicine.  Blood pressure medicine.  Anti-platelet drugs.  Certain medicines may reduce symptoms of Intermittent Claudication.  Interventional/Surgical options:  Angioplasty. An Angioplasty is a procedure that inflates a balloon in the blocked artery. This opens the blocked artery to improve blood flow.  Stent Implant. A wire mesh tube (stent) is placed in the artery. The stent expands and stays in place, allowing the artery to remain open.  Peripheral Bypass Surgery. This is a surgical procedure that reroutes the blood around a blocked artery to help improve blood flow. This type of procedure may be performed if Angioplasty or stent implants are not an option. SEEK IMMEDIATE MEDICAL CARE IF:   You develop pain or numbness in your arms or legs.  Your arm or leg turns cold, becomes blue in color.  You develop redness, warmth, swelling and pain in your arms or legs. MAKE SURE YOU:   Understand these instructions.  Will watch your condition.  Will get help right away if you are not doing well or get worse. Document Released: 09/21/2004 Document Revised: 11/06/2011 Document Reviewed: 08/18/2008 ExitCare Patient Information 2015 [employer]. This information is not intended to replace advice given to you by your health care provider. Make sure you discuss any questions you have with your health care provider.    Smoking  Cessation Quitting smoking is important to your health and has many advantages. However, it is not always easy to quit since nicotine is a very addictive drug. Oftentimes, people try 3 times or more before being able to quit. This document explains the best ways for you to prepare to quit smoking. Quitting takes hard work and a lot of effort, but you can do it. ADVANTAGES OF QUITTING SMOKING  You will live longer, feel better, and live better.  Your body will feel the impact of quitting smoking almost immediately.  Within 20 minutes, blood pressure decreases. Your pulse returns to its normal level.  After 8 hours, carbon monoxide levels in the blood return to normal. Your oxygen level increases.  After 24 hours, the chance of having a heart attack starts to decrease. Your breath, hair, and body stop smelling like smoke.  After 48 hours, damaged nerve endings begin to recover. Your sense of taste and smell improve.  After 72 hours, the body is virtually free of nicotine. Your bronchial tubes relax and breathing becomes easier.  After 2 to 12 weeks, lungs can hold more air. Exercise becomes easier and circulation improves.  The risk of having a heart attack, stroke,   cancer, or lung disease is greatly reduced.  After 1 year, the risk of coronary heart disease is cut in half.  After 5 years, the risk of stroke falls to the same as a nonsmoker.  After 10 years, the risk of lung cancer is cut in half and the risk of other cancers decreases significantly.  After 15 years, the risk of coronary heart disease drops, usually to the level of a nonsmoker.  If you are pregnant, quitting smoking will improve your chances of having a healthy baby.  The people you live with, especially any children, will be healthier.  You will have extra money to spend on things other than cigarettes. QUESTIONS TO THINK ABOUT BEFORE ATTEMPTING TO QUIT You may want to talk about your answers with your health care  provider.  Why do you want to quit?  If you tried to quit in the past, what helped and what did not?  What will be the most difficult situations for you after you quit? How will you plan to handle them?  Who can help you through the tough times? Your family? Friends? A health care provider?  What pleasures do you get from smoking? What ways can you still get pleasure if you quit? Here are some questions to ask your health care provider:  How can you help me to be successful at quitting?  What medicine do you think would be best for me and how should I take it?  What should I do if I need more help?  What is smoking withdrawal like? How can I get information on withdrawal? GET READY  Set a quit date.  Change your environment by getting rid of all cigarettes, ashtrays, matches, and lighters in your home, car, or work. Do not let people smoke in your home.  Review your past attempts to quit. Think about what worked and what did not. GET SUPPORT AND ENCOURAGEMENT You have a better chance of being successful if you have help. You can get support in many ways.  Tell your family, friends, and coworkers that you are going to quit and need their support. Ask them not to smoke around you.  Get individual, group, or telephone counseling and support. Programs are available at local hospitals and health centers. Call your local health department for information about programs in your area.  Spiritual beliefs and practices may help some smokers quit.  Download a "quit meter" on your computer to keep track of quit statistics, such as how long you have gone without smoking, cigarettes not smoked, and money saved.  Get a self-help book about quitting smoking and staying off tobacco. LEARN NEW SKILLS AND BEHAVIORS  Distract yourself from urges to smoke. Talk to someone, go for a walk, or occupy your time with a task.  Change your normal routine. Take a different route to work. Drink tea  instead of coffee. Eat breakfast in a different place.  Reduce your stress. Take a hot bath, exercise, or read a book.  Plan something enjoyable to do every day. Reward yourself for not smoking.  Explore interactive web-based programs that specialize in helping you quit. GET MEDICINE AND USE IT CORRECTLY Medicines can help you stop smoking and decrease the urge to smoke. Combining medicine with the above behavioral methods and support can greatly increase your chances of successfully quitting smoking.  Nicotine replacement therapy helps deliver nicotine to your body without the negative effects and risks of smoking. Nicotine replacement therapy includes nicotine gum, lozenges,   inhalers, nasal sprays, and skin patches. Some may be available over-the-counter and others require a prescription.  Antidepressant medicine helps people abstain from smoking, but how this works is unknown. This medicine is available by prescription.  Nicotinic receptor partial agonist medicine simulates the effect of nicotine in your brain. This medicine is available by prescription. Ask your health care provider for advice about which medicines to use and how to use them based on your health history. Your health care provider will tell you what side effects to look out for if you choose to be on a medicine or therapy. Carefully read the information on the package. Do not use any other product containing nicotine while using a nicotine replacement product.  RELAPSE OR DIFFICULT SITUATIONS Most relapses occur within the first 3 months after quitting. Do not be discouraged if you start smoking again. Remember, most people try several times before finally quitting. You may have symptoms of withdrawal because your body is used to nicotine. You may crave cigarettes, be irritable, feel very hungry, cough often, get headaches, or have difficulty concentrating. The withdrawal symptoms are only temporary. They are strongest when you  first quit, but they will go away within 10-14 days. To reduce the chances of relapse, try to:  Avoid drinking alcohol. Drinking lowers your chances of successfully quitting.  Reduce the amount of caffeine you consume. Once you quit smoking, the amount of caffeine in your body increases and can give you symptoms, such as a rapid heartbeat, sweating, and anxiety.  Avoid smokers because they can make you want to smoke.  Do not let weight gain distract you. Many smokers will gain weight when they quit, usually less than 10 pounds. Eat a healthy diet and stay active. You can always lose the weight gained after you quit.  Find ways to improve your mood other than smoking. FOR MORE INFORMATION  www.smokefree.gov  Document Released: 08/08/2001 Document Revised: 12/29/2013 Document Reviewed: 11/23/2011 ExitCare Patient Information 2015 [employer]. This information is not intended to replace advice given to you by your health care provider. Make sure you discuss any questions you have with your health care provider.    Smoking Cessation, Tips for Success If you are ready to quit smoking, congratulations! You have chosen to help yourself be healthier. Cigarettes bring nicotine, tar, carbon monoxide, and other irritants into your body. Your lungs, heart, and blood vessels will be able to work better without these poisons. There are many different ways to quit smoking. Nicotine gum, nicotine patches, a nicotine inhaler, or nicotine nasal spray can help with physical craving. Hypnosis, support groups, and medicines help break the habit of smoking. WHAT THINGS CAN I DO TO MAKE QUITTING EASIER?  Here are some tips to help you quit for good:  Pick a date when you will quit smoking completely. Tell all of your friends and family about your plan to quit on that date.  Do not try to slowly cut down on the number of cigarettes you are smoking. Pick a quit date and quit smoking completely starting on that  day.  Throw away all cigarettes.   Clean and remove all ashtrays from your home, work, and car.  On a card, write down your reasons for quitting. Carry the card with you and read it when you get the urge to smoke.  Cleanse your body of nicotine. Drink enough water and fluids to keep your urine clear or pale yellow. Do this after quitting to flush the nicotine from   your body.  Learn to predict your moods. Do not let a bad situation be your excuse to have a cigarette. Some situations in your life might tempt you into wanting a cigarette.  Never have "just one" cigarette. It leads to wanting another and another. Remind yourself of your decision to quit.  Change habits associated with smoking. If you smoked while driving or when feeling stressed, try other activities to replace smoking. Stand up when drinking your coffee. Brush your teeth after eating. Sit in a different chair when you read the paper. Avoid alcohol while trying to quit, and try to drink fewer caffeinated beverages. Alcohol and caffeine may urge you to smoke.  Avoid foods and drinks that can trigger a desire to smoke, such as sugary or spicy foods and alcohol.  Ask people who smoke not to smoke around you.  Have something planned to do right after eating or having a cup of coffee. For example, plan to take a walk or exercise.  Try a relaxation exercise to calm you down and decrease your stress. Remember, you may be tense and nervous for the first 2 weeks after you quit, but this will pass.  Find new activities to keep your hands busy. Play with a pen, coin, or rubber band. Doodle or draw things on paper.  Brush your teeth right after eating. This will help cut down on the craving for the taste of tobacco after meals. You can also try mouthwash.   Use oral substitutes in place of cigarettes. Try using lemon drops, carrots, cinnamon sticks, or chewing gum. Keep them handy so they are available when you have the urge to  smoke.  When you have the urge to smoke, try deep breathing.  Designate your home as a nonsmoking area.  If you are a heavy smoker, ask your health care provider about a prescription for nicotine chewing gum. It can ease your withdrawal from nicotine.  Reward yourself. Set aside the cigarette money you save and buy yourself something nice.  Look for support from others. Join a support group or smoking cessation program. Ask someone at home or at work to help you with your plan to quit smoking.  Always ask yourself, "Do I need this cigarette or is this just a reflex?" Tell yourself, "Today, I choose not to smoke," or "I do not want to smoke." You are reminding yourself of your decision to quit.  Do not replace cigarette smoking with electronic cigarettes (commonly called e-cigarettes). The safety of e-cigarettes is unknown, and some may contain harmful chemicals.  If you relapse, do not give up! Plan ahead and think about what you will do the next time you get the urge to smoke. HOW WILL I FEEL WHEN I QUIT SMOKING? You may have symptoms of withdrawal because your body is used to nicotine (the addictive substance in cigarettes). You may crave cigarettes, be irritable, feel very hungry, cough often, get headaches, or have difficulty concentrating. The withdrawal symptoms are only temporary. They are strongest when you first quit but will go away within 10-14 days. When withdrawal symptoms occur, stay in control. Think about your reasons for quitting. Remind yourself that these are signs that your body is healing and getting used to being without cigarettes. Remember that withdrawal symptoms are easier to treat than the major diseases that smoking can cause.  Even after the withdrawal is over, expect periodic urges to smoke. However, these cravings are generally short lived and will go away whether you   smoke or not. Do not smoke! WHAT RESOURCES ARE AVAILABLE TO HELP ME QUIT SMOKING? Your health care  provider can direct you to community resources or hospitals for support, which may include:  Group support.  Education.  Hypnosis.  Therapy. Document Released: 05/12/2004 Document Revised: 12/29/2013 Document Reviewed: 01/30/2013 ExitCare Patient Information 2015 [employer]. This information is not intended to replace advice given to you by your health care provider. Make sure you discuss any questions you have with your health care provider.  

## 2014-10-26 ENCOUNTER — Telehealth: Payer: Self-pay | Admitting: Cardiology

## 2014-10-26 ENCOUNTER — Telehealth: Payer: Self-pay | Admitting: Vascular Surgery

## 2014-10-26 NOTE — Telephone Encounter (Signed)
Pt having fullness in chest - not every day - but has had two heart attacks and is worried.

## 2014-10-26 NOTE — Telephone Encounter (Signed)
Yes, appropriate for flex clinic.

## 2014-10-26 NOTE — Telephone Encounter (Signed)
Called, spoke to pt. Given MI history she is concerned for recent CP that has developed. She has had pain intermittently, she describes periodic, brief sharp pain in back and arm. More like "twinges" than stabbing pain. She has also had this pain midsternally. She has not been concerned about it to the point of trying her [facility], so unsure how to proceed. Also cites possible indigestion as culprit.  Advised would be appropriate for [place] to evaluate in office s long as she is not having active pain. She does understand instructions on when to report to ED for her symptoms.  Will route to [facility] to advise  -appropriate for flex clinic?

## 2014-10-26 NOTE — Telephone Encounter (Signed)
Set up appt w/ [facility] tomorrow.

## 2014-10-26 NOTE — Telephone Encounter (Signed)
Spoke with pt. Gave her appt info: Dr. Charlsie Merles 11/03/14 2:30 pm. Pt verbalized understanding.

## 2014-10-27 ENCOUNTER — Encounter: Payer: Self-pay | Admitting: Cardiology

## 2014-10-27 ENCOUNTER — Ambulatory Visit (INDEPENDENT_AMBULATORY_CARE_PROVIDER_SITE_OTHER): Payer: Medicare Other | Admitting: Cardiology

## 2014-10-27 ENCOUNTER — Inpatient Hospital Stay (HOSPITAL_COMMUNITY)
Admission: EM | Admit: 2014-10-27 | Discharge: 2014-11-05 | DRG: 229 | Disposition: A | Discharge status: Home / Self Care | Payer: Medicare Other | Attending: Cardiology | Admitting: Cardiology

## 2014-10-27 ENCOUNTER — Encounter (HOSPITAL_COMMUNITY): Payer: Self-pay | Admitting: Physical Medicine and Rehabilitation

## 2014-10-27 VITALS — BP 152/92 | HR 154 | Ht 66.0 in | Wt 114.8 lb

## 2014-10-27 DIAGNOSIS — I4892 Unspecified atrial flutter: Secondary | ICD-10-CM | POA: Diagnosis present

## 2014-10-27 DIAGNOSIS — Z825 Family history of asthma and other chronic lower respiratory diseases: Secondary | ICD-10-CM

## 2014-10-27 DIAGNOSIS — Z91148 Patient's other noncompliance with medication regimen for other reason: Secondary | ICD-10-CM

## 2014-10-27 DIAGNOSIS — I152 Hypertension secondary to endocrine disorders: Secondary | ICD-10-CM | POA: Diagnosis present

## 2014-10-27 DIAGNOSIS — I739 Peripheral vascular disease, unspecified: Secondary | ICD-10-CM | POA: Diagnosis not present

## 2014-10-27 DIAGNOSIS — I10 Essential (primary) hypertension: Secondary | ICD-10-CM

## 2014-10-27 DIAGNOSIS — I252 Old myocardial infarction: Secondary | ICD-10-CM | POA: Diagnosis not present

## 2014-10-27 DIAGNOSIS — E785 Hyperlipidemia, unspecified: Secondary | ICD-10-CM

## 2014-10-27 DIAGNOSIS — Z955 Presence of coronary angioplasty implant and graft: Secondary | ICD-10-CM | POA: Diagnosis not present

## 2014-10-27 DIAGNOSIS — G4089 Other seizures: Secondary | ICD-10-CM | POA: Diagnosis not present

## 2014-10-27 DIAGNOSIS — Z8261 Family history of arthritis: Secondary | ICD-10-CM

## 2014-10-27 DIAGNOSIS — Z9861 Coronary angioplasty status: Secondary | ICD-10-CM

## 2014-10-27 DIAGNOSIS — I200 Unstable angina: Secondary | ICD-10-CM | POA: Diagnosis present

## 2014-10-27 DIAGNOSIS — R0789 Other chest pain: Secondary | ICD-10-CM | POA: Diagnosis present

## 2014-10-27 DIAGNOSIS — Z950 Presence of cardiac pacemaker: Secondary | ICD-10-CM | POA: Diagnosis not present

## 2014-10-27 DIAGNOSIS — Z599 Problem related to housing and economic circumstances, unspecified: Secondary | ICD-10-CM

## 2014-10-27 DIAGNOSIS — Z9114 Patient's other noncompliance with medication regimen: Secondary | ICD-10-CM | POA: Diagnosis not present

## 2014-10-27 DIAGNOSIS — Z833 Family history of diabetes mellitus: Secondary | ICD-10-CM

## 2014-10-27 DIAGNOSIS — M069 Rheumatoid arthritis, unspecified: Secondary | ICD-10-CM | POA: Diagnosis not present

## 2014-10-27 DIAGNOSIS — K219 Gastro-esophageal reflux disease without esophagitis: Secondary | ICD-10-CM | POA: Diagnosis present

## 2014-10-27 DIAGNOSIS — Z79899 Other long term (current) drug therapy: Secondary | ICD-10-CM | POA: Diagnosis not present

## 2014-10-27 DIAGNOSIS — F17200 Nicotine dependence, unspecified, uncomplicated: Secondary | ICD-10-CM | POA: Diagnosis present

## 2014-10-27 DIAGNOSIS — T50996A Underdosing of other drugs, medicaments and biological substances, initial encounter: Secondary | ICD-10-CM | POA: Diagnosis present

## 2014-10-27 DIAGNOSIS — Z89411 Acquired absence of right great toe: Secondary | ICD-10-CM | POA: Diagnosis not present

## 2014-10-27 DIAGNOSIS — H9203 Otalgia, bilateral: Secondary | ICD-10-CM | POA: Diagnosis not present

## 2014-10-27 DIAGNOSIS — Z91120 Patient's intentional underdosing of medication regimen due to financial hardship: Secondary | ICD-10-CM | POA: Diagnosis present

## 2014-10-27 DIAGNOSIS — I495 Sick sinus syndrome: Secondary | ICD-10-CM | POA: Diagnosis not present

## 2014-10-27 DIAGNOSIS — K30 Functional dyspepsia: Secondary | ICD-10-CM | POA: Diagnosis present

## 2014-10-27 DIAGNOSIS — E1169 Type 2 diabetes mellitus with other specified complication: Secondary | ICD-10-CM | POA: Diagnosis present

## 2014-10-27 DIAGNOSIS — I70202 Unspecified atherosclerosis of native arteries of extremities, left leg: Secondary | ICD-10-CM | POA: Diagnosis not present

## 2014-10-27 DIAGNOSIS — Z7901 Long term (current) use of anticoagulants: Secondary | ICD-10-CM | POA: Diagnosis not present

## 2014-10-27 DIAGNOSIS — I2510 Atherosclerotic heart disease of native coronary artery without angina pectoris: Secondary | ICD-10-CM | POA: Diagnosis not present

## 2014-10-27 DIAGNOSIS — Z681 Body mass index (BMI) 19 or less, adult: Secondary | ICD-10-CM

## 2014-10-27 DIAGNOSIS — I2119 ST elevation (STEMI) myocardial infarction involving other coronary artery of inferior wall: Secondary | ICD-10-CM

## 2014-10-27 DIAGNOSIS — M329 Systemic lupus erythematosus, unspecified: Secondary | ICD-10-CM | POA: Diagnosis not present

## 2014-10-27 DIAGNOSIS — I5181 Takotsubo syndrome: Secondary | ICD-10-CM | POA: Diagnosis present

## 2014-10-27 DIAGNOSIS — IMO0002 Reserved for concepts with insufficient information to code with codable children: Secondary | ICD-10-CM

## 2014-10-27 DIAGNOSIS — R5081 Fever presenting with conditions classified elsewhere: Secondary | ICD-10-CM | POA: Diagnosis not present

## 2014-10-27 DIAGNOSIS — R509 Fever, unspecified: Secondary | ICD-10-CM | POA: Diagnosis not present

## 2014-10-27 DIAGNOSIS — Y92019 Unspecified place in single-family (private) house as the place of occurrence of the external cause: Secondary | ICD-10-CM

## 2014-10-27 DIAGNOSIS — I4891 Unspecified atrial fibrillation: Secondary | ICD-10-CM | POA: Diagnosis not present

## 2014-10-27 DIAGNOSIS — R945 Abnormal results of liver function studies: Secondary | ICD-10-CM | POA: Diagnosis present

## 2014-10-27 DIAGNOSIS — I480 Paroxysmal atrial fibrillation: Secondary | ICD-10-CM | POA: Diagnosis not present

## 2014-10-27 DIAGNOSIS — R05 Cough: Secondary | ICD-10-CM

## 2014-10-27 DIAGNOSIS — R059 Cough, unspecified: Secondary | ICD-10-CM

## 2014-10-27 DIAGNOSIS — Z87891 Personal history of nicotine dependence: Secondary | ICD-10-CM | POA: Diagnosis not present

## 2014-10-27 DIAGNOSIS — R0602 Shortness of breath: Secondary | ICD-10-CM | POA: Diagnosis not present

## 2014-10-27 DIAGNOSIS — I4510 Unspecified right bundle-branch block: Secondary | ICD-10-CM | POA: Diagnosis not present

## 2014-10-27 DIAGNOSIS — I25110 Atherosclerotic heart disease of native coronary artery with unstable angina pectoris: Secondary | ICD-10-CM | POA: Diagnosis present

## 2014-10-27 DIAGNOSIS — R4701 Aphasia: Secondary | ICD-10-CM | POA: Diagnosis not present

## 2014-10-27 DIAGNOSIS — R569 Unspecified convulsions: Secondary | ICD-10-CM | POA: Insufficient documentation

## 2014-10-27 DIAGNOSIS — R7989 Other specified abnormal findings of blood chemistry: Secondary | ICD-10-CM | POA: Diagnosis present

## 2014-10-27 HISTORY — DX: Sick sinus syndrome: I49.5

## 2014-10-27 HISTORY — DX: Paroxysmal atrial fibrillation: I48.0

## 2014-10-27 HISTORY — DX: Unspecified atrial flutter: I48.92

## 2014-10-27 HISTORY — DX: Presence of cardiac pacemaker: Z95.0

## 2014-10-27 LAB — COMPREHENSIVE METABOLIC PANEL
ALT: 13 U/L (ref 0–35)
AST: 24 U/L (ref 0–37)
Albumin: 3.5 g/dL (ref 3.5–5.2)
Alkaline Phosphatase: 89 U/L (ref 39–117)
Anion gap: 7 (ref 5–15)
BUN: 10 mg/dL (ref 6–23)
CALCIUM: 9.5 mg/dL (ref 8.4–10.5)
CO2: 26 mmol/L (ref 19–32)
Chloride: 101 mmol/L (ref 96–112)
Creatinine, Ser: 1.18 mg/dL — ABNORMAL HIGH (ref 0.50–1.10)
GFR calc Af Amer: 57 mL/min — ABNORMAL LOW (ref >90)
GFR calc non Af Amer: 49 mL/min — ABNORMAL LOW (ref >90)
Glucose, Bld: 144 mg/dL — ABNORMAL HIGH (ref 70–99)
Potassium: 4 mmol/L (ref 3.5–5.1)
Sodium: 134 mmol/L — ABNORMAL LOW (ref 135–145)
Total Bilirubin: 0.9 mg/dL (ref 0.3–1.2)
Total Protein: 9.3 g/dL — ABNORMAL HIGH (ref 6.0–8.3)

## 2014-10-27 LAB — URINALYSIS, ROUTINE W REFLEX MICROSCOPIC
BILIRUBIN URINE: NEGATIVE
GLUCOSE, UA: NEGATIVE mg/dL
Hgb urine dipstick: NEGATIVE
KETONES UR: NEGATIVE mg/dL
Leukocytes, UA: NEGATIVE
Nitrite: NEGATIVE
Protein, ur: NEGATIVE mg/dL
Specific Gravity, Urine: 1.017 (ref 1.005–1.030)
Urobilinogen, UA: 1 mg/dL (ref 0.0–1.0)
pH: 5.5 (ref 5.0–8.0)

## 2014-10-27 LAB — CBC WITH DIFFERENTIAL/PLATELET
BASOS ABS: 0.1 10*3/uL (ref 0.0–0.1)
BASOS PCT: 1 % (ref 0–1)
EOS ABS: 0 10*3/uL (ref 0.0–0.7)
Eosinophils Relative: 0 % (ref 0–5)
HCT: 48.4 % — ABNORMAL HIGH (ref 36.0–46.0)
HEMOGLOBIN: 16.4 g/dL — AB (ref 12.0–15.0)
Lymphocytes Relative: 25 % (ref 12–46)
Lymphs Abs: 2 10*3/uL (ref 0.7–4.0)
MCH: 25.6 pg — AB (ref 26.0–34.0)
MCHC: 33.9 g/dL (ref 30.0–36.0)
MCV: 75.6 fL — ABNORMAL LOW (ref 78.0–100.0)
Monocytes Absolute: 0.9 10*3/uL (ref 0.1–1.0)
Monocytes Relative: 11 % (ref 3–12)
Neutro Abs: 5 10*3/uL (ref 1.7–7.7)
Neutrophils Relative %: 63 % (ref 43–77)
PLATELETS: 396 10*3/uL (ref 150–400)
RBC: 6.4 MIL/uL — ABNORMAL HIGH (ref 3.87–5.11)
RDW: 15.6 % — AB (ref 11.5–15.5)
WBC: 8 10*3/uL (ref 4.0–10.5)

## 2014-10-27 LAB — MAGNESIUM: Magnesium: 1.8 mg/dL (ref 1.5–2.5)

## 2014-10-27 LAB — TROPONIN I
Troponin I: 0.04 ng/mL — ABNORMAL HIGH (ref <0.031)
Troponin I: 0.05 ng/mL — ABNORMAL HIGH (ref <0.031)
Troponin I: <0.03 ng/mL (ref <0.031)

## 2014-10-27 LAB — HEPARIN LEVEL (UNFRACTIONATED): Heparin Unfractionated: 0.21 IU/mL — ABNORMAL LOW (ref 0.30–0.70)

## 2014-10-27 LAB — TSH: TSH: 0.379 u[IU]/mL (ref 0.350–4.500)

## 2014-10-27 MED ORDER — HEPARIN BOLUS VIA INFUSION
3000.0000 [IU] | Freq: Once | INTRAVENOUS | Status: AC
Start: 2014-10-27 — End: 2014-10-27
  Administered 2014-10-27: 3000 [IU] via INTRAVENOUS
  Filled 2014-10-27: qty 3000

## 2014-10-27 MED ORDER — ASPIRIN 81 MG PO CHEW
324.0000 mg | CHEWABLE_TABLET | ORAL | Status: AC
  Administered 2014-10-27: 324 mg via ORAL
  Filled 2014-10-27: qty 4

## 2014-10-27 MED ORDER — SODIUM CHLORIDE 0.9 % IV SOLN: 250.0000 mL | INTRAVENOUS | Status: DC | PRN

## 2014-10-27 MED ORDER — NITROGLYCERIN 0.4 MG SL SUBL
0.4000 mg | SUBLINGUAL_TABLET | SUBLINGUAL | Status: DC | PRN
  Administered 2014-10-28: 0.4 mg via SUBLINGUAL
  Filled 2014-10-27 (×2): qty 1

## 2014-10-27 MED ORDER — HEPARIN BOLUS VIA INFUSION
1500.0000 [IU] | Freq: Once | INTRAVENOUS | Status: AC
  Administered 2014-10-27: 1500 [IU] via INTRAVENOUS
  Filled 2014-10-27: qty 1500

## 2014-10-27 MED ORDER — PANTOPRAZOLE SODIUM 40 MG PO TBEC
40.0000 mg | DELAYED_RELEASE_TABLET | Freq: Every day | ORAL | Status: DC
  Administered 2014-10-27 – 2014-11-05 (×8): 40 mg via ORAL
  Filled 2014-10-27 (×8): qty 1

## 2014-10-27 MED ORDER — ENSURE COMPLETE PO LIQD
237.0000 mL | Freq: Two times a day (BID) | ORAL | Status: DC
Start: 2014-10-28 — End: 2014-11-05
  Administered 2014-10-29 – 2014-11-05 (×10): 237 mL via ORAL
  Filled 2014-10-27 (×5): qty 237

## 2014-10-27 MED ORDER — HEPARIN (PORCINE) IN NACL 100-0.45 UNIT/ML-% IJ SOLN
850.0000 [IU]/h | INTRAMUSCULAR | Status: DC
  Administered 2014-10-27: 700 [IU]/h via INTRAVENOUS
  Filled 2014-10-27: qty 250

## 2014-10-27 MED ORDER — ACETAMINOPHEN 325 MG PO TABS
650.0000 mg | ORAL_TABLET | ORAL | Status: DC | PRN
  Administered 2014-10-29 – 2014-10-31 (×5): 650 mg via ORAL
  Filled 2014-10-27 (×6): qty 2

## 2014-10-27 MED ORDER — METOPROLOL TARTRATE 25 MG PO TABS: 25.0000 mg | ORAL_TABLET | Freq: Two times a day (BID) | ORAL | Status: DC

## 2014-10-27 MED ORDER — ADENOSINE 6 MG/2ML IV SOLN
6.0000 mg | Freq: Once | INTRAVENOUS | Status: AC
  Administered 2014-10-27: 6 mg via INTRAVENOUS
  Filled 2014-10-27: qty 2

## 2014-10-27 MED ORDER — ASPIRIN EC 81 MG PO TBEC
81.0000 mg | DELAYED_RELEASE_TABLET | Freq: Every day | ORAL | Status: DC
  Administered 2014-10-28 – 2014-11-02 (×6): 81 mg via ORAL
  Filled 2014-10-27 (×6): qty 1

## 2014-10-27 MED ORDER — ASPIRIN 300 MG RE SUPP: 300.0000 mg | RECTAL | Status: DC

## 2014-10-27 MED ORDER — ZOLPIDEM TARTRATE 5 MG PO TABS: 5.0000 mg | ORAL_TABLET | Freq: Every evening | ORAL | Status: DC | PRN

## 2014-10-27 MED ORDER — SODIUM CHLORIDE 0.9 % IJ SOLN
3.0000 mL | Freq: Two times a day (BID) | INTRAMUSCULAR | Status: DC
  Administered 2014-10-29 – 2014-11-03 (×9): 3 mL via INTRAVENOUS

## 2014-10-27 MED ORDER — ALPRAZOLAM 0.25 MG PO TABS: 0.2500 mg | ORAL_TABLET | Freq: Two times a day (BID) | ORAL | Status: DC | PRN

## 2014-10-27 MED ORDER — ROSUVASTATIN CALCIUM 10 MG PO TABS
10.0000 mg | ORAL_TABLET | Freq: Every day | ORAL | Status: DC
  Administered 2014-10-27 – 2014-11-05 (×10): 10 mg via ORAL
  Filled 2014-10-27 (×10): qty 1

## 2014-10-27 MED ORDER — CLOPIDOGREL BISULFATE 75 MG PO TABS
75.0000 mg | ORAL_TABLET | Freq: Every day | ORAL | Status: DC
  Administered 2014-10-28: 75 mg via ORAL
  Filled 2014-10-27: qty 1

## 2014-10-27 MED ORDER — METOPROLOL TARTRATE 25 MG PO TABS
25.0000 mg | ORAL_TABLET | Freq: Two times a day (BID) | ORAL | Status: DC
  Administered 2014-10-27 – 2014-10-31 (×8): 25 mg via ORAL
  Filled 2014-10-27: qty 2
  Filled 2014-10-27 (×9): qty 1

## 2014-10-27 MED ORDER — SODIUM CHLORIDE 0.9 % IV SOLN
1.0000 mL/kg/h | INTRAVENOUS | Status: DC
  Administered 2014-10-27: 1 mL/kg/h via INTRAVENOUS

## 2014-10-27 MED ORDER — SODIUM CHLORIDE 0.9 % IJ SOLN: 3.0000 mL | INTRAMUSCULAR | Status: DC | PRN

## 2014-10-27 MED ORDER — ONDANSETRON HCL 4 MG/2ML IJ SOLN
4.0000 mg | Freq: Four times a day (QID) | INTRAMUSCULAR | Status: DC | PRN
  Administered 2014-10-28: 4 mg via INTRAVENOUS
  Filled 2014-10-27: qty 2

## 2014-10-27 NOTE — ED Notes (Signed)
Pt presents to department for evaluation of palpitations, SOB and chest heaviness. Was seen at [facility] this morning for possible SVT/atrial flutter, sent to ED for further evaluation. Pt is alert and oriented x4. Pt reports chest pressure upon arrival. Skin warm and dry.

## 2014-10-27 NOTE — Assessment & Plan Note (Signed)
Was on statin. Not currently taking. Will need refill prescriptions on discharge

## 2014-10-27 NOTE — Progress Notes (Addendum)
Subjective:  Pt feels better after conversion to NSR with Adenosine in ED. She admitted to me she has been out of her medications for "months" secondary to financial issues. She also admits she has been having "indigestion" and today had jaw pain in setting of tachycardia (though she was unaware that she was in 2:1 A flutter). In ED initial Troponin 0.04.   Objective:  Vital Signs in the last 24 hours: Temp:  [98.2 F (36.8 C)] 98.2 F (36.8 C) (03/01 1100) Pulse Rate:  [79-155] 87 (03/01 1345) Resp:  [14-25] 14 (03/01 1345) BP: (102-152)/(67-104) 138/90 mmHg (03/01 1345) SpO2:  [95 %-99 %] 98 % (03/01 1345) Weight:  [114 lb 12.8 oz (52.073 kg)] 114 lb 12.8 oz (52.073 kg) (03/01 0918)  Intake/Output from previous day: No intake or output data in the 24 hours ending 10/27/14 1350  Physical Exam: General appearance: alert, cooperative, no distress and thin Lungs: clear to auscultation bilaterally Heart: regular rate and rhythm   Rate: 78  Rhythm: normal sinus rhythm and incomplete RBBB  Lab Results:  Recent Labs  10/27/14 1108  WBC 8.0  HGB 16.4*  PLT 396    Recent Labs  10/27/14 1108  NA 134*  K 4.0  CL 101  CO2 26  GLUCOSE 144*  BUN 10  CREATININE 1.18*    Recent Labs  10/27/14 1108  TROPONINI 0.04*   No results for input(s): INR in the last 72 hours.  Imaging: No results found.  Cardiac Studies: Echo 05/20/13 Study Conclusions  - Left ventricle: The cavity size was normal. Wall thickness was normal. Systolic function was normal. The estimated ejection fraction was in the range of 55% to 60%. There is hypokinesis of the mid-distallateral myocardium. Doppler parameters are consistent with abnormal left ventricular relaxation (grade 1 diastolic dysfunction). - Mitral valve: Trivial regurgitation. - Right atrium: Central venous pressure: 51mm Hg (est). - Tricuspid valve: Trivial regurgitation. - Pulmonary arteries: Systolic pressure  could not be accurately estimated. - Pericardium, extracardiac: There was no pericardial effusion. Probable epicardial adipose tissue near apex. Impressions:  - Comparison to prior study from February 2011. Upper normal LV wall thickness with LVEF 55-60%, mid to distal lateral hypokinesis, grade 1 diastolic dysfunction. Normal RV contraction. Unable to assess PASP. No pericardial effusion.   Assessment/Plan:   Principal Problem:   Unstable angina-jaw pain Active Problems:   Atrial flutter with rapid ventricular response vs. 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 It may be best to admit, IV Heparin, resume medications including ASA, Plavix, beta blocker and statin, and consider further ischemic work up.   Corine Shelter PA-C Beeper [phone] 10/27/2014, 1:50 PM   Agree with note written by Corine Shelter [facility]  Agree with note by Corine Shelter. Pt of Dr Ludwig Clarks with H/O remote [place] and LCX DES 9/14. She hasn't felt goot for a few weeks. C/O fatigue and 'indigestion'. She was seen in office by Dr. Herbie Baltimore and sent to ER. She was found to be in Aflutter with 2:1, converted with IV adenosine to SR. Feels better now. Trop mildly elevated. EKG w/o acute changes. Exam benign. Given CP, new  arrhythmia and + Trop , feel cath best diagnostic option. I have discussed this with the patient and she agrees. Will arrange to have performed radially. She stopped all of her meds because of $. I'm not sure she would be a good candidate for oral anticoag given this. Restart home meds. Needs case manager to review options for her to obtain her meds as an OP.   Runell Gess 10/27/2014 2:16 PM

## 2014-10-27 NOTE — Assessment & Plan Note (Signed)
No actual anginal symptoms with rapid heart rate response to either SVT or atrial flutter. I don't suspect this being ischemic, however with potential A. Flutter diagnosis may want to consider ischemic evaluation once stable.

## 2014-10-27 NOTE — Assessment & Plan Note (Addendum)
Emergency room for evaluation by cardiology. Would consider adenosine administration to see if we can determine what this is flutter versus SVT. Would like to then use beta blocker for rate control as she has history of CAD. CHA2DSVasc = 4 - would our long-term and regulation if this is felt to be atrial flutter

## 2014-10-27 NOTE — ED Notes (Signed)
Attempted report 

## 2014-10-27 NOTE — Assessment & Plan Note (Signed)
Stable.  Followed by vascular surgery 

## 2014-10-27 NOTE — Progress Notes (Signed)
PATIENT: Mercedes [name] MRN: [id number] DOB: 1955/12/19 PCP: Rene Paci, MD  Clinic Note / History & Physical Examinatin Chief Complaint  Patient presents with  . Follow-up    pt c/o chest fullness and tiredness    HPI: Mercedes [name] is a 59 y.o. female with a PMH below who presents today for evaluation for fatigue and intermittent chest discomfort. She is a patient of Dr. Olga Millers last seen November 2014. Her medical history includes admission for "heart attack" in December of 2010 ought to be related to TakoTsubo CM. She also then had an episode of a non-ST elevation MI September 2014 found to have a significant lesion in the circumflex treated with appropriate DES stent 2.5 mm x 16 mm action from her initial possible that improved to 55-60% by echocardiogram. She has not had any history of arrhythmias, but has been seen now by vascular surgery PAD and has had at least one toe amputation and has had surgical revascularization of the left leg.  Interval History: She presents today for urgent evaluation noting a couple days ago she's been having some intermittent chest discomfort on the right side of her chest, it is mostly just of feeling extremely fatigued and lack of energy. She has spells where she will feel okay; her back again. She denies any rapid irregular heartbeat/palpitations, cessation of syncope or near syncope.. She denies any lightheadedness or dizziness except when she goes from sitting to standing. She doesn't have any PND but does note some mild orthopnea. She is not at her true anginal type discomfort.   She does have some pain in the left foot but denies significant claudication.  She indicates that she for financial reasons has only been taking her blood pressure medication intermittently and is taking her aspirin but has not been taking Plavix or her other medications such as Lipitor.  Past Medical History  Diagnosis Date  . Takotsubo cardiomyopathy  12.2010    f/u echo 09/2009: EF 50-55%, mild LVH, mod diast dysfxn, mild apical HK  . PAD (peripheral artery disease)     s/p L fem pop 11/2013 in setting of 1st toe osteo/gangrene  . GERD (gastroesophageal reflux disease)   . Dizziness     ? CVA 01/2010 - carotid dopplers with no ICA stenosis  . Arnold-Chiari malformation, type I     MRI brain 02/2010  . Bradycardia   . Tobacco abuse     quit 04/2013  . DEPRESSION   . HYPERLIPIDEMIA   . HYPERTENSION   . CAD (coronary artery disease)     a. NSTEMI 07/2009: LHC - D2 40%, LAD irreg., EF 50% with apical AK (tako-tsubo CM);  b. Inf STEMI (04/2013): Tx Promus DES to CFX;  c. 08/2013 [facility]i CL: No ischemia, dist ant/ap/inf-ap infarct, EF   . SLE (systemic lupus erythematosus) dx 05/2013    follows with rheum Dareen Piano)  . Sjogren's syndrome 06/02/13    pt denies this (12/01/13)  . Abnormal LFTs     08/2013 Lipitor 40 d/c'd and crestor 10 started   . Anxiety   . RA (rheumatoid arthritis)     prior tx by Dr. Dareen Piano  . Anemia     as a young woman  . Myocardial infarction     Prior Cardiac Evaluation and Past Surgical History: Past Surgical History  Procedure Laterality Date  . Coronary angioplasty  04/2013  . Tubal ligation    . Colonoscopy    . Femoral-popliteal bypass graft Left  LEFT FEMORAL-BELOW KNEE POPLITEAL ARTERY BYPASS GRAFT; Surgeon: Charles E Fields, MD; Location: [facility] OR; Service: Vascular; Laterality: Left;  . Amputation Left 12/02/2013    Procedure: LEFT 1ST TOE AMPUTATION; Surgeon: Charles E Fields, MD; Location: [facility] OR; Service: Vascular; Laterality: Left;  . Left heart catheterization with coronary angiogram N/A 05/19/2013    Procedure: LEFT HEART CATHETERIZATION WITH CORONARY ANGIOGRAM; Surgeon: Michael D Cooper, MD; Location: [facility] CATH LAB; Service:  Cardiovascular; Laterality: N/A;  . Percutaneous coronary stent intervention (pci-s)  05/19/2013    Procedure: PERCUTANEOUS CORONARY STENT INTERVENTION (PCI-S); Surgeon: Michael D Cooper, MD; Location: [facility] CATH LAB; Service: Cardiovascular;;  . Abdominal aortagram N/A 11/24/2013    Procedure: ABDOMINAL AORTAGRAM WITH BILATERAL RUNOFF WITH POSSIBLE INTERVENTION; Surgeon: Christopher S Dickson, MD; Location: [facility] CATH LAB; Service: Cardiovascular; Laterality: N/A;    Allergies  Allergen Reactions  . Brilinta [Ticagrelor] Shortness Of Breath    Also chest tightness    Current Outpatient Prescriptions  Medication Sig Dispense Refill  . aspirin 81 MG tablet Take 1 tablet (81 mg total) by mouth daily. 30 tablet   . lisinopril (PRINIVIL,ZESTRIL) 20 MG tablet TAKE 1 TABLET BY MOUTH DAILY 30 tablet 5  . nitroGLYCERIN (NITROSTAT) 0.4 MG SL tablet Place 0.4 mg under the tongue every 5 (five) minutes as needed for chest pain.     No current facility-administered medications for this visit.    History   Social History Narrative   Lives alone.   History  Substance Use Topics  . Smoking status: Former Smoker    Types: Cigarettes    Quit date: 05/19/2013  . Smokeless tobacco: Never Used     Comment:   . Alcohol Use: 0.0 oz/week    0 Standard drinks or equivalent per week     Comment: i DONT DRINK ANYMORE"    family history includes Arthritis in her father and mother; COPD in her father; Diabetes in her other; Emphysema in her mother.  ROS: A comprehensive Review of Systems - was performed Review of Systems  Constitutional: Positive for chills and malaise/fatigue.  Respiratory: Positive for sputum production. Negative for cough and wheezing.  Cardiovascular: Positive for chest pain (Right lateral chest wall.), orthopnea and claudication. Negative for palpitations, leg swelling and PND.    Gastrointestinal: Negative. Negative for heartburn, nausea, vomiting, blood in stool and melena.  Genitourinary: Positive for dysuria (this AM). Negative for hematuria and flank pain.  Musculoskeletal: Negative for myalgias, joint pain and falls.  Neurological: Positive for dizziness. Negative for headaches.  Endo/Heme/Allergies: Does not bruise/bleed easily.  Psychiatric/Behavioral: Negative.  All other systems reviewed and are negative.  PHYSICAL EXAM BP 152/92 mmHg  Pulse 154  Ht 5' 6" (1.676 m)  Wt 114 lb 12.8 oz (52.073 kg)  BMI 18.54 kg/m2 General appearance: alert, cooperative, appears stated age and Very thin, yet well-nourished appearing. Appears to be in mild distress but nontoxic Neck: no adenopathy, no carotid bruit, no JVD and HEENT: [place]/AT/EOMI, PERRL Lungs: clear to auscultation bilaterally, normal percussion bilaterally and She initially had some mildly decreased breath sounds in left base with rhonchi that cleared with cough. Heart: Tachycardic but regular rhythm with no M/Rr/G. Nondisplaced PMI but hyperdynamic. Abdomen: soft, non-tender; bowel sounds normal; no masses, no organomegaly Extremities: extremities normal, atraumatic, no cyanosis or edema Pulses: Mildly diminished pulses in the left lower leg dorsalis pedis and posterior tibial pulses with 1+ pulses in the right. Skin: Skin color, texture, turgor normal. No rashes or lesions Neurologic: Alert and oriented X   left lower leg dorsalis pedis and posterior tibial pulses with 1+ pulses in the right. Skin: Skin color, texture, turgor normal. No rashes or lesions Neurologic: Alert and oriented X 3, normal strength and tone. Normal symmetric reflexes. Normal coordination and gait Cranial nerves: normal   Adult ECG Report  Rate: 154 ;  Rhythm: SVT versus atrial flutter with 2:1 conduction; left anterior fascicular block with a R. axis of -72.  QRS Axis: -72;  PR Interval: 132/* ;  QRS Duration: 76 ; QTc: 387, Voltages: normal  Narrative Interpretation: cannot distinguish rhythm there is a regular rapid rhythm SVT versus atrial flutter with 2:1 conduction.  Carotid massage and Valsalva maneuver, there was a brief episode of heart rate slowing but immediately reverted back to the rapid rate.  Recent Labs:  None   ASSESSMENT / PLAN: 59 year old woman with single vessel CAD and history of possible arthropathy with PAD now presenting with  rapid heart rates and fatigue. She does not note any anginal symptoms despite having an extremely rapid heart rate which would argue against this being ischemic. It is flutter versus SVT.  I think the best option for her is to go to the emergency room where she can be evaluated and potentially adenosine to be administered to determine whether she has flutter waves versus a broken a CT conduction. Regardless the patient would need overnight admission and would likely start a beta blocker.  She has noted some dysuria this morning which would probably need to be evaluated with a UA.   She's also been noticing some mild chills and Cipro objective CBC upon arrival to evaluate for any leukocytosis.  Her PCI was in September 2014 and she is now beyond one year from her PCI therefore be on aspirin alone is reasonable. However with her PAD I would probably consider restarting her Plavix. This should be all depend on whether or not she proves to be in atrial flutter or not.  If she is indeed in atrial flutter would consider possible referral to EP for flutter ablation.  Atrial flutter with rapid ventricular response vs. SVT Emergency room for evaluation by cardiology. Would consider adenosine administration to see if we can determine what this is flutter versus SVT. Would like to then use beta blocker for rate control as she has history of CAD. CHA2DSVasc = 4 - would our long-term and regulation if this is felt to be atrial flutter   CAD S/P percutaneous coronary angioplasty She's had one episode of a MI that was thought to be related to Takotsubo with a recovered EF followed by a true non-STEMI with a documented circumflex stenosis treated with a stent.  Third-generation DES following one year of Plavix. However with a PAD would also consider restarting Plavix - provided she would not need long-term an ablation for atrial flutter   Essential hypertension Borderline control on ACE inhibitor. Would likely be  having a beta blocker initiated during this hospital stay.   HYPERLIPIDEMIA Was on statin. Not currently taking. Will need refill prescriptions on discharge   PAD (peripheral artery disease) Stable. Followed by vascular surgery.   Acute MI, inferior wall No actual anginal symptoms with rapid heart rate response to either SVT or atrial flutter. I don't suspect this being ischemic, however with potential A. Flutter diagnosis may want to consider ischemic evaluation once stable.    No orders of the defined types were placed in this encounter.   No orders of the defined types were placed in this  encounter.    F/u with Dr. Baker Janus following d/.c  DAVID W. Herbie [place], M.D., M.S. Interventional Cardiolgy [facility]

## 2014-10-27 NOTE — Progress Notes (Signed)
ANTICOAGULATION CONSULT NOTE - Initial Consult  Pharmacy Consult for heparin Indication: aflutter and r/o chest pain/ACS  Allergies  Allergen Reactions  . Brilinta [Ticagrelor] Shortness Of Breath    Also chest tightness    Patient Measurements: height 66 inches, weight 52 kg   Heparin Dosing Weight: 52 kg  Vital Signs: Temp: 98.2 F (36.8 C) (03/01 1100) Temp Source: Oral (03/01 1100) BP: 138/90 mmHg (03/01 1345) Pulse Rate: 87 (03/01 1345)  Labs:  Recent Labs  10/27/14 1108  HGB 16.4*  HCT 48.4*  PLT 396  CREATININE 1.18*  TROPONINI 0.04*    Estimated Creatinine Clearance: 41.7 mL/min (by C-G formula based on Cr of 1.18).   Medical History: Past Medical History  Diagnosis Date  . 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 PAD (peripheral artery disease)     s/p L fem pop 11/2013 in setting of 1st toe osteo/gangrene  . GERD (gastroesophageal reflux disease)   . Dizziness     ? CVA 01/2010 - carotid dopplers with no ICA stenosis  . Arnold-Chiari malformation, type I     MRI brain 02/2010  . Bradycardia   . Tobacco abuse     quit 04/2013  . DEPRESSION   . HYPERLIPIDEMIA   . HYPERTENSION   . CAD (coronary artery disease)     a. NSTEMI 07/2009: LHC - D2 40%, LAD irreg., EF 50% with apical AK (tako-tsubo CM);  b. Inf STEMI (04/2013): Tx Promus DES to CFX;  c. 08/2013 [facility]i CL: No ischemia, dist ant/ap/inf-ap infarct, EF   . SLE (systemic lupus erythematosus) dx 05/2013    follows with rheum Dareen Piano)  . Sjogren's syndrome 06/02/13    pt denies this (12/01/13)  . Abnormal LFTs     08/2013 Lipitor 40 d/c'd and crestor 10 started   . Anxiety   . RA (rheumatoid arthritis)     prior tx by Dr. Dareen Piano  . Anemia     as a young woman  . Myocardial infarction     Medications:  See med rec  Assessment: Patent is a a 59 y.o F presented to the ED from her cardiologist office with c/o palpitation and  chest heaviness. To start heparin for atrial flutter and r/o ACS. Patient denies being on anticoagulant PTA or hx of recent bleeding.  Goal of Therapy:  Heparin level 0.3-0.7 units/ml Monitor platelets by anticoagulation protocol: Yes   Plan:  - heparin 3000 units IV bolus, then drip at 700 units/hr - check 6 hour heparin level  Bradin Mcadory P 10/27/2014,2:09 PM

## 2014-10-27 NOTE — ED Notes (Signed)
Patient given water, MD stated okay to give.

## 2014-10-27 NOTE — ED Provider Notes (Signed)
CSN: [id number]     Arrival date & time 10/27/14  1055 History   First MD Initiated Contact with Patient 10/27/14 1058     Chief Complaint  Patient presents with  . Palpitations  . Shortness of Breath  . Chest Pain     (Consider location/radiation/quality/duration/timing/severity/associated sxs/prior Treatment) HPI Comments: Patient is a 59 year old female with history of coronary artery disease with prior coronary stenting, hypertension, anemia. She presents with complaints of shortness of breath and weakness for the past several days. She does not report any specific fluttering or racing of her heart, however was found to be in apparent atrial flutter at the doctor's office this morning. She presented there initially for evaluation of the above symptoms and was referred here for further workup and treatment. She is unaware of her rapid heart rate.  Patient is a 59 y.o. female presenting with palpitations. The history is provided by the patient.  Palpitations Palpitations quality:  Regular Onset quality: Unknown. Timing:  Constant Progression:  Worsening Chronicity:  New Relieved by:  Nothing Worsened by:  Nothing Ineffective treatments:  None tried   Past Medical History  Diagnosis Date  . 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 PAD (peripheral artery disease)     s/p L fem pop 11/2013 in setting of 1st toe osteo/gangrene  . GERD (gastroesophageal reflux disease)   . Dizziness     ? CVA 01/2010 - carotid dopplers with no ICA stenosis  . Arnold-Chiari malformation, type I     MRI brain 02/2010  . Bradycardia   . Tobacco abuse     quit 04/2013  . DEPRESSION   . HYPERLIPIDEMIA   . HYPERTENSION   . CAD (coronary artery disease)     a. NSTEMI 07/2009: LHC - D2 40%, LAD irreg., EF 50% with apical AK (tako-tsubo CM);  b. Inf STEMI (04/2013): Tx Promus DES to CFX;  c. 08/2013 [facility]i CL: No ischemia, dist ant/ap/inf-ap  infarct, EF   . SLE (systemic lupus erythematosus) dx 05/2013    follows with rheum Dareen Piano)  . Sjogren's syndrome 06/02/13    pt denies this (12/01/13)  . Abnormal LFTs     08/2013 Lipitor 40 d/c'd and crestor 10 started   . Anxiety   . RA (rheumatoid arthritis)     prior tx by Dr. Dareen Piano  . Anemia     as a young woman  . Myocardial infarction    Past Surgical History  Procedure Laterality Date  . Coronary angioplasty  04/2013  . Tubal ligation    . Colonoscopy    . Femoral-popliteal bypass graft Left 12/02/2013    Procedure:  LEFT FEMORAL-BELOW KNEE POPLITEAL ARTERY BYPASS GRAFT;  Surgeon: Sherren Kerns, MD;  Location[facility];  Service: Vascular;  Laterality: Left;  . Amputation Left 12/02/2013    Procedure: LEFT 1ST TOE AMPUTATION;  Surgeon: Sherren Kerns, MD;  Location: [facility] OR;  Service: Vascular;  Laterality: Left;  . Left heart catheterization with coronary angiogram N/A 05/19/2013    Procedure: LEFT HEART CATHETERIZATION WITH CORONARY ANGIOGRAM;  Surgeon: Micheline Chapman, MD;  Location: [facility] CATH LAB;  Service: Cardiovascular;  Laterality: N/A;  . Percutaneous coronary stent intervention (pci-s)  05/19/2013    Procedure: PERCUTANEOUS CORONARY STENT INTERVENTION (PCI-S);  Surgeon: Micheline Chapman, MD;  Location: [name] CATH LAB;  Service: Cardiovascular;;  . Abdominal aortagram N/A 11/24/2013    Procedure: ABDOMINAL  AORTAGRAM WITH BILATERAL RUNOFF WITH POSSIBLE INTERVENTION;  Surgeon: Chuck Hint, MD;  Location: [facility] CATH LAB;  Service: Cardiovascular;  Laterality: N/A;   Family History  Problem Relation Age of Onset  . Arthritis Mother   . Emphysema Mother   . Arthritis Father   . COPD Father   . Diabetes Other    History  Substance Use Topics  . Smoking status: Former Smoker    Types: Cigarettes    Quit date: 05/19/2013  . Smokeless tobacco: Never Used     Comment:    . Alcohol Use: 0.0 oz/week    0 Standard drinks or equivalent per week   OB History    No  data available     Review of Systems  Cardiovascular: Positive for palpitations.  All other systems reviewed and are negative.     Allergies  Brilinta  Home Medications   Prior to Admission medications   Medication Sig Start Date End Date Taking? Authorizing Provider  aspirin 81 MG tablet Take 1 tablet (81 mg total) by mouth daily. 05/21/13   Xylah G Barrett, PA-C  lisinopril (PRINIVIL,ZESTRIL) 20 MG tablet TAKE 1 TABLET BY MOUTH DAILY 02/23/14   Newt Lukes, MD  nitroGLYCERIN (NITROSTAT) 0.4 MG SL tablet Place 0.4 mg under the tongue every 5 (five) minutes as needed for chest pain.    Historical Provider, MD   BP 122/92 mmHg  Pulse 155  Temp(Src) 98.2 F (36.8 C) (Oral)  Resp 14  SpO2 99% Physical Exam  Constitutional: She is oriented to person, place, and time. She appears well-developed and well-nourished. No distress.  HENT:  Head: Normocephalic and atraumatic.  Neck: Normal range of motion. Neck supple.  Cardiovascular: Regular rhythm and normal heart sounds.  Exam reveals no gallop and no friction rub.   No murmur heard. Pulmonary/Chest: Effort normal and breath sounds normal. No respiratory distress. She has no wheezes.  Abdominal: Soft. Bowel sounds are normal. She exhibits no distension. There is no tenderness.  Musculoskeletal: Normal range of motion.  Neurological: She is alert and oriented to person, place, and time.  Skin: Skin is warm and dry. She is not diaphoretic.  Nursing note and vitals reviewed.   ED Course  Procedures (including critical care time) Labs Review Labs Reviewed - No data to display  Imaging Review No results found.   EKG Interpretation   Date/Time:  Tuesday October 27 2014 10:58:23 EST Ventricular Rate:  157 PR Interval:    QRS Duration: 92 QT Interval:  240 QTC Calculation: 388 R Axis:   -77 Text Interpretation:  Atrial flutter with 2:1 A-V conduction Left axis  deviation Incomplete right bundle branch block Inferior  infarct , age  undetermined Abnormal ECG Confirmed by DELOS  MD, Elizebeth Kluesner ([id number]) on  10/27/2014 11:19:11 AM      MDM   Final diagnoses:  None    Patient is a 59 year old female with history of coronary artery disease. She presents with complaints of shortness of breath and heaviness in her chest. She was initially seen at the cardiology clinic and sent here for further evaluation as she was found to be in atrial flutter with a rate of 150.  She arrived here with persistent atrial flutter. Shortly after arrival IV access was established and the patient was given adenosine. This caused her rate to slow briefly revealing underlying flutter waves. Is her rate began to pick back up, she then spontaneously converted to a sinus rhythm. Patient has been off  of her medications for some time and she has a significant cardiac history which I feel warrants admission and further evaluation. I've spoken with cardiology who will evaluate and admit the patient.  CRITICAL CARE Performed by: Geoffery Lyons Total critical care time: 45 minutes Critical care time was exclusive of separately billable procedures and treating other patients. Critical care was necessary to treat or prevent imminent or life-threatening deterioration. Critical care was time spent personally by me on the following activities: development of treatment plan with patient and/or surrogate as well as nursing, discussions with consultants, evaluation of patient's response to treatment, examination of patient, obtaining history from patient or surrogate, ordering and performing treatments and interventions, ordering and review of laboratory studies, ordering and review of radiographic studies, pulse oximetry and re-evaluation of patient's condition.     Geoffery Lyons, MD 10/27/14 [phone]

## 2014-10-27 NOTE — Assessment & Plan Note (Signed)
She's had one episode of a MI that was thought to be related to Takotsubo with a recovered EF followed by a true non-STEMI with a documented circumflex stenosis treated with a stent.  Third-generation DES following one year of Plavix. However with a PAD would also consider restarting Plavix - provided she would not need long-term an ablation for atrial flutter

## 2014-10-27 NOTE — ED Notes (Signed)
Patient underlying rhythm was a-flutter. Patient now in NSR. Patient alert &O  x4 patient HR 88. Patient O2 99%.

## 2014-10-27 NOTE — Progress Notes (Signed)
UR Completed Carollyn Etcheverry Graves-Bigelow, RN,BSN [phone]  

## 2014-10-27 NOTE — H&P (Signed)
PATIENTArvis Gervasi McCollumMRN: [id number] DOB: [birth year] PCP: Rene Paci, MD  Clinic Note / History & Physical Examinatin Chief Complaint  Patient presents with  . Follow-up    pt c/o chest fullness and tiredness    HPI: Mercedes [name] is a 59 y.o. female with a PMH below who presents today for evaluation for fatigue and intermittent chest discomfort. She is a patient of Dr. Olga Millers last seen November 2014. Her medical history includes admission for "heart attack" in December of 2010 ought to be related to TakoTsubo CM. She also then had an episode of a non-ST elevation MI September 2014 found to have a significant lesion in the circumflex treated with appropriate DES stent 2.5 mm x 16 mm action from her initial possible that improved to 55-60% by echocardiogram. She has not had any history of arrhythmias, but has been seen now by vascular surgery PAD and has had at least one toe amputation and has had surgical revascularization of the left leg.  Interval History: She presents today for urgent evaluation noting a couple days ago she's been having some intermittent chest discomfort on the right side of her chest, it is mostly just of feeling extremely fatigued and lack of energy. She has spells where she will feel okay; her back again. She denies any rapid irregular heartbeat/palpitations, cessation of syncope or near syncope.. She denies any lightheadedness or dizziness except when she goes from sitting to standing. She doesn't have any PND but does note some mild orthopnea. She is not at her true anginal type discomfort.  She does have some pain in the left foot but denies significant claudication.  She indicates that she for financial reasons has only been taking her blood pressure medication intermittently and is taking her aspirin but has not been taking Plavix or her other medications such as Lipitor.  Past Medical History  Diagnosis Date  .  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 PAD (peripheral artery disease)     s/p L fem pop 11/2013 in setting of 1st toe osteo/gangrene  . GERD (gastroesophageal reflux disease)   . Dizziness     ? CVA 01/2010 - carotid dopplers with no ICA stenosis  . Arnold-Chiari malformation, type I     MRI brain 02/2010  . Bradycardia   . Tobacco abuse     quit 04/2013  . DEPRESSION   . HYPERLIPIDEMIA   . HYPERTENSION   . CAD (coronary artery disease)     a. NSTEMI 07/2009: LHC - D2 40%, LAD irreg., EF 50% with apical AK (tako-tsubo CM); b. Inf STEMI (04/2013): Tx Promus DES to CFX; c. 08/2013 Lexi CL: No ischemia, dist ant/ap/inf-ap infarct, EF   . SLE (systemic lupus erythematosus) dx 05/2013    follows with rheum Dareen Piano)  . Sjogren's syndrome 06/02/13    pt denies this (12/01/13)  . Abnormal LFTs     08/2013 Lipitor 40 d/c'd and crestor 10 started   . Anxiety   . RA (rheumatoid arthritis)     prior tx by Dr. Dareen Piano  . Anemia     as a young woman  . Myocardial infarction     Prior Cardiac Evaluation and Past Surgical History: Past Surgical History  Procedure Laterality Date  . Coronary angioplasty  04/2013  . Tubal ligation    . Colonoscopy    . Femoral-popliteal bypass graft Left 12/02/2013    Procedure:  LEFT FEMORAL-BELOW KNEE POPLITEAL ARTERY BYPASS GRAFT; Surgeon: Sherren Kerns, MD; Location: [facility] OR; Service: Vascular; Laterality: Left;  . Amputation Left 12/02/2013    Procedure: LEFT 1ST TOE AMPUTATION; Surgeon: Sherren Kerns, MD; Location[facility] OR; Service: Vascular; Laterality: Left;  . Left heart catheterization with coronary angiogram N/A 05/19/2013    Procedure: LEFT HEART CATHETERIZATION WITH CORONARY ANGIOGRAM; Surgeon: Micheline Chapman, MD; Location: [facility] CATH LAB; Service:  Cardiovascular; Laterality: N/A;  . Percutaneous coronary stent intervention (pci-s)  05/19/2013    Procedure: PERCUTANEOUS CORONARY STENT INTERVENTION (PCI-S); Surgeon: Micheline Chapman, MD; Location: [facility] CATH LAB; Service: Cardiovascular;;  . Abdominal aortagram N/A 11/24/2013    Procedure: ABDOMINAL AORTAGRAM WITH BILATERAL RUNOFF WITH POSSIBLE INTERVENTION; Surgeon: Chuck Hint, MD; Location: [facility] CATH LAB; Service: Cardiovascular; Laterality: N/A;    Allergies  Allergen Reactions  . Brilinta [Ticagrelor] Shortness Of Breath    Also chest tightness    Current Outpatient Prescriptions  Medication Sig Dispense Refill  . aspirin 81 MG tablet Take 1 tablet (81 mg total) by mouth daily. 30 tablet   . lisinopril (PRINIVIL,ZESTRIL) 20 MG tablet TAKE 1 TABLET BY MOUTH DAILY 30 tablet 5  . nitroGLYCERIN (NITROSTAT) 0.4 MG SL tablet Place 0.4 mg under the tongue every 5 (five) minutes as needed for chest pain.     No current facility-administered medications for this visit.    History   Social History Narrative   Lives alone.   History  Substance Use Topics  . Smoking status: Former Smoker    Types: Cigarettes    Quit date: 05/19/2013  . Smokeless tobacco: Never Used     Comment:   . Alcohol Use: 0.0 oz/week    0 Standard drinks or equivalent per week     Comment: i DONT DRINK ANYMORE"    family history includes Arthritis in her father and mother; COPD in her father; Diabetes in her other; Emphysema in her mother.  ROS: A comprehensive Review of Systems - was performed Review of Systems  Constitutional: Positive for chills and malaise/fatigue.  Respiratory: Positive for sputum production. Negative for cough and wheezing.  Cardiovascular: Positive for chest pain (Right lateral chest wall.), orthopnea and claudication. Negative for palpitations, leg swelling and PND.    Gastrointestinal: Negative. Negative for heartburn, nausea, vomiting, blood in stool and melena.  Genitourinary: Positive for dysuria (this AM). Negative for hematuria and flank pain.  Musculoskeletal: Negative for myalgias, joint pain and falls.  Neurological: Positive for dizziness. Negative for headaches.  Endo/Heme/Allergies: Does not bruise/bleed easily.  Psychiatric/Behavioral: Negative.  All other systems reviewed and are negative.  PHYSICAL EXAM BP 152/92 mmHg  Pulse 154  Ht 5\' 6"  (1.676 m)  Wt 114 lb 12.8 oz (52.073 kg)  BMI 18.54 kg/m2 General appearance: alert, cooperative, appears stated age and Very thin, yet well-nourished appearing. Appears to be in mild distress but nontoxic Neck: no adenopathy, no carotid bruit, no JVD and HEENT: [place]/AT/EOMI, PERRL Lungs: clear to auscultation bilaterally, normal percussion bilaterally and She initially had some mildly decreased breath sounds in left base with rhonchi that cleared with cough. Heart: Tachycardic but regular rhythm with no M/Rr/G. Nondisplaced PMI but hyperdynamic. Abdomen: soft, non-tender; bowel sounds normal; no masses, no organomegaly Extremities: extremities normal, atraumatic, no cyanosis or edema Pulses: Mildly diminished pulses in the left lower leg dorsalis pedis and posterior tibial pulses with 1+ pulses in the right. Skin: Skin color, texture, turgor normal. No rashes or lesions Neurologic: Alert and oriented X  3, normal strength and tone. Normal symmetric reflexes. Normal coordination and gait Cranial nerves: normal  Adult ECG Report Rate: 154 ; Rhythm: SVT versus atrial flutter with 2:1 conduction; left anterior fascicular block with a R. axis of -72. QRS Axis: -72; PR Interval: 132/* ; QRS Duration: 76 ; QTc: 387, Voltages: normal Narrative Interpretation: cannot distinguish rhythm there is a regular rapid rhythm SVT versus atrial flutter with 2:1 conduction. Carotid massage and Valsalva maneuver,  there was a brief episode of heart rate slowing but immediately reverted back to the rapid rate.  Recent Labs: None   ASSESSMENT / PLAN: 59 year old woman with single vessel CAD and history of possible arthropathy with PAD now presenting with rapid heart rates and fatigue. She does not note any anginal symptoms despite having an extremely rapid heart rate which would argue against this being ischemic. It is flutter versus SVT. I think the best option for her is to go to the emergency room where she can be evaluated and potentially adenosine to be administered to determine whether she has flutter waves versus a broken a CT conduction. Regardless the patient would need overnight admission and would likely start a beta blocker.  She has noted some dysuria this morning which would probably need to be evaluated with a UA.  She's also been noticing some mild chills and Cipro objective CBC upon arrival to evaluate for any leukocytosis.  Her PCI was in September 2014 and she is now beyond one year from her PCI therefore be on aspirin alone is reasonable. However with her PAD I would probably consider restarting her Plavix. This should be all depend on whether or not she proves to be in atrial flutter or not. If she is indeed in atrial flutter would consider possible referral to EP for flutter ablation.  Atrial flutter with rapid ventricular response vs. SVT Emergency room for evaluation by cardiology. Would consider adenosine administration to see if we can determine what this is flutter versus SVT. Would like to then use beta blocker for rate control as she has history of CAD. CHA2DSVasc = 4 - would our long-term and regulation if this is felt to be atrial flutter   CAD S/P percutaneous coronary angioplasty She's had one episode of a MI that was thought to be related to Takotsubo with a recovered EF followed by a true non-STEMI with a documented circumflex stenosis treated with a  stent.  Third-generation DES following one year of Plavix. However with a PAD would also consider restarting Plavix - provided she would not need long-term an ablation for atrial flutter   Essential hypertension Borderline control on ACE inhibitor. Would likely be having a beta blocker initiated during this hospital stay.   HYPERLIPIDEMIA Was on statin. Not currently taking. Will need refill prescriptions on discharge   PAD (peripheral artery disease) Stable. Followed by vascular surgery.   Acute MI, inferior wall No actual anginal symptoms with rapid heart rate response to either SVT or atrial flutter. I don't suspect this being ischemic, however with potential A. Flutter diagnosis may want to consider ischemic evaluation once stable.    No orders of the defined types were placed in this encounter.  No orders of the defined types were placed in this encounter.   F/u with Dr. Baker Janus following d/.c  DAVID W. Herbie Baltimore, M.D., M.S. Interventional Cardiolgy [facility]

## 2014-10-27 NOTE — Assessment & Plan Note (Signed)
Borderline control on ACE inhibitor. Would likely be having a beta blocker initiated during this hospital stay.

## 2014-10-27 NOTE — Progress Notes (Signed)
PHARMACY NOTE  Pharmacy Consult :  59 y.o. female is currently on Heparin infusion for aflutter and r/o chest pain/ACS.   Heparin Dosing Wt :  52 kg  Hematology :   10/27/14 1108 10/27/14 2020  HGB 16.4*  --   HCT 48.4*  --   PLT 396  --   HEPARINUNFRC  --  0.21*  CREATININE 1.18*  --     Current Medication[s] Include:  Scheduled:  Scheduled:  . [START ON 10/28/2014] aspirin EC  81 mg Oral Daily  . [START ON 10/28/2014] clopidogrel  75 mg Oral Q breakfast  . [START ON 10/28/2014] feeding supplement (ENSURE COMPLETE)  237 mL Oral BID BM  . metoprolol tartrate  25 mg Oral BID  . pantoprazole  40 mg Oral Q0600  . rosuvastatin  10 mg Oral Daily  . sodium chloride  3 mL Intravenous Q12H   Infusion[s]: Infusions:  . heparin 700 Units/hr (10/27/14 1535)    Assessment :  Heparin infusing at 700 units/hr.    Heparin level is sub-therapeutic at 0.21 units/ml.  No evidence of bleeding complications observed.  Goal :  Heparin level 0.3-0.7 units/ml   Plan : 1. Will give Heparin bolus of 1500 units IV now, then increase the Heparin infusion rate to 850 units/hr.  The next Heparin Level will be drawn with the AM labs.    2. Daily Heparin level, CBC while on Heparin.  Monitor for bleeding complications. Follow Platelet counts.  Tempest Frankland, Elisha Headland,  Pharm.D  10/27/2014  9:11 PM

## 2014-10-27 NOTE — Care Management Note (Unsigned)
    Page 1 of 1   11/04/2014     4:28:46 PM CARE MANAGEMENT NOTE 11/04/2014  Patient:  Mercedes [name], Mercedes [name]   Account Number:  [id number]  Date Initiated:  10/27/2014  Documentation initiated by:  GRAVES-BIGELOW,Aliviana Burdell  Subjective/Objective Assessment:   Pt admitted for Unstable angina-jaw pain and Atrial flutter RVR vs. SVT. Pt has insurance-however co pays have been expensive. Pt has family support.     Action/Plan:   CM did speak with pt in regards to medication assistance. CM provided pt the Medicare Low income Subsidy information form. No further needs assessed by CM at this time.   Anticipated DC Date:  10/28/2014   Anticipated DC Plan:  HOME/SELF CARE      DC Planning Services  CM consult      Choice offered to / List presented to:             Status of service:  Completed, signed off Medicare Important Message given?  YES (If response is "NO", the following Medicare IM given date fields will be blank) Date Medicare IM given:  10/30/2014 Medicare IM given by:  GRAVES-BIGELOW,Nylan Nakatani Date Additional Medicare IM given:  11/02/2014 Additional Medicare IM given by:  Patricia Fargo GRAVES-BIGELOW  Discharge Disposition:    Per UR Regulation:  Reviewed for med. necessity/level of care/duration of stay  If discussed at Long Length of Stay Meetings, dates discussed:   11/03/2014  11/05/2014    Comments:  11-04-14 1627 Tomi Bamberger, RN,BSN [phone] Plan for Leadless pacemaker implantation today. CM to monitor for disposition needs.   ELIQUIS is covered, but a PRIOR AUTHORIZATION is required. Prior Authorization phoneline is [phone]. Retail Pharmacy co-payment will be $45.00 for this medication. CM will provide pt with the 30 day free card. No further needs identified at this time. 1055 10-29-14 Tomi Bamberger, RN,BSN [phone]

## 2014-10-27 NOTE — ED Notes (Signed)
Cardiology at bedside.

## 2014-10-28 ENCOUNTER — Encounter (HOSPITAL_COMMUNITY)
Admission: EM | Disposition: A | Discharge status: Home / Self Care | Payer: Self-pay | Source: Home / Self Care | Attending: Cardiology

## 2014-10-28 ENCOUNTER — Ambulatory Visit (HOSPITAL_COMMUNITY): Admit: 2014-10-28 | Payer: Medicare Other | Admitting: Cardiovascular Disease

## 2014-10-28 ENCOUNTER — Inpatient Hospital Stay (HOSPITAL_COMMUNITY): Payer: Medicare Other

## 2014-10-28 ENCOUNTER — Encounter (HOSPITAL_COMMUNITY): Payer: Self-pay | Admitting: Cardiovascular Disease

## 2014-10-28 DIAGNOSIS — I2510 Atherosclerotic heart disease of native coronary artery without angina pectoris: Secondary | ICD-10-CM

## 2014-10-28 HISTORY — PX: LEFT HEART CATHETERIZATION WITH CORONARY ANGIOGRAM: SHX5451

## 2014-10-28 LAB — PROTIME-INR
INR: 1.15 (ref 0.00–1.49)
INR: 1.1 (ref 0.00–1.49)
PROTHROMBIN TIME: 14.8 s (ref 11.6–15.2)
Prothrombin Time: 14.4 seconds (ref 11.6–15.2)

## 2014-10-28 LAB — BASIC METABOLIC PANEL
Anion gap: 6 (ref 5–15)
BUN: 9 mg/dL (ref 6–23)
CO2: 22 mmol/L (ref 19–32)
Calcium: 8.5 mg/dL (ref 8.4–10.5)
Chloride: 108 mmol/L (ref 96–112)
Creatinine, Ser: 0.77 mg/dL (ref 0.50–1.10)
GFR calc Af Amer: >90 mL/min (ref >90)
GFR calc non Af Amer: 89 mL/min — ABNORMAL LOW (ref >90)
Glucose, Bld: 108 mg/dL — ABNORMAL HIGH (ref 70–99)
Potassium: 3.8 mmol/L (ref 3.5–5.1)
Sodium: 136 mmol/L (ref 135–145)

## 2014-10-28 LAB — LIPID PANEL
Cholesterol: 132 mg/dL (ref 0–200)
HDL: 38 mg/dL — ABNORMAL LOW (ref >39)
LDL Cholesterol: 79 mg/dL (ref 0–99)
Total CHOL/HDL Ratio: 3.5 RATIO
Triglycerides: 74 mg/dL (ref <150)
VLDL: 15 mg/dL (ref 0–40)

## 2014-10-28 LAB — CBC
HCT: 38.2 % (ref 36.0–46.0)
Hemoglobin: 12.2 g/dL (ref 12.0–15.0)
MCH: 24.7 pg — ABNORMAL LOW (ref 26.0–34.0)
MCHC: 31.9 g/dL (ref 30.0–36.0)
MCV: 77.3 fL — ABNORMAL LOW (ref 78.0–100.0)
Platelets: 359 10*3/uL (ref 150–400)
RBC: 4.94 MIL/uL (ref 3.87–5.11)
RDW: 15.9 % — AB (ref 11.5–15.5)
WBC: 5.8 10*3/uL (ref 4.0–10.5)

## 2014-10-28 LAB — APTT: aPTT: 29 seconds (ref 24–37)

## 2014-10-28 LAB — GLUCOSE, CAPILLARY: Glucose-Capillary: 113 mg/dL — ABNORMAL HIGH (ref 70–99)

## 2014-10-28 LAB — HEPARIN LEVEL (UNFRACTIONATED): HEPARIN UNFRACTIONATED: 0.58 [IU]/mL (ref 0.30–0.70)

## 2014-10-28 SURGERY — LEFT HEART CATHETERIZATION WITH CORONARY ANGIOGRAM

## 2014-10-28 MED ORDER — ALUM & MAG HYDROXIDE-SIMETH 200-200-20 MG/5ML PO SUSP
30.0000 mL | Freq: Once | ORAL | Status: AC
  Administered 2014-10-28: 30 mL via ORAL
  Filled 2014-10-28: qty 30

## 2014-10-28 MED ORDER — LIDOCAINE HCL (PF) 1 % IJ SOLN
INTRAMUSCULAR | Status: AC
  Filled 2014-10-28: qty 30

## 2014-10-28 MED ORDER — FENTANYL CITRATE 0.05 MG/ML IJ SOLN
INTRAMUSCULAR | Status: AC
  Filled 2014-10-28: qty 2

## 2014-10-28 MED ORDER — VERAPAMIL HCL 2.5 MG/ML IV SOLN
INTRAVENOUS | Status: AC
Start: 2014-10-28 — End: 2014-10-28
  Filled 2014-10-28: qty 2

## 2014-10-28 MED ORDER — MORPHINE SULFATE 2 MG/ML IJ SOLN
1.0000 mg | Freq: Once | INTRAMUSCULAR | Status: AC
  Administered 2014-10-28: 1 mg via INTRAVENOUS
  Filled 2014-10-28: qty 1

## 2014-10-28 MED ORDER — MIDAZOLAM HCL 2 MG/2ML IJ SOLN
INTRAMUSCULAR | Status: AC
  Filled 2014-10-28: qty 2

## 2014-10-28 MED ORDER — HEPARIN SODIUM (PORCINE) 1000 UNIT/ML IJ SOLN
INTRAMUSCULAR | Status: AC
  Filled 2014-10-28: qty 1

## 2014-10-28 MED ORDER — SODIUM CHLORIDE 0.9 % IV SOLN
INTRAVENOUS | Status: AC
  Administered 2014-10-28: 12:00:00 via INTRAVENOUS

## 2014-10-28 MED ORDER — VERAPAMIL HCL 2.5 MG/ML IV SOLN
INTRAVENOUS | Status: AC
  Filled 2014-10-28: qty 2

## 2014-10-28 MED ORDER — HEPARIN (PORCINE) IN NACL 2-0.9 UNIT/ML-% IJ SOLN
INTRAMUSCULAR | Status: AC
  Filled 2014-10-28: qty 1000

## 2014-10-28 MED ORDER — OFF THE BEAT BOOK
Freq: Once | Status: AC
  Administered 2014-10-28: 22:00:00
  Filled 2014-10-28: qty 1

## 2014-10-28 MED ORDER — LORAZEPAM 2 MG/ML IJ SOLN
INTRAMUSCULAR | Status: AC
  Filled 2014-10-28: qty 1

## 2014-10-28 NOTE — Consult Note (Signed)
Referring Physician: Jens Som    Chief Complaint: Code stroke  HPI:                                                                                                                                         Mercedes [name] is an 59 y.o. female presenting to hospital for fatigue and chest discomfort. Today obtained a heart catheterization which showed no significant occlusion.  patient was doing well after her heart catheterization however when nurse walked into room she was noted not to be verbally responsive and have left arm and leg weakness. patient was able to follow commands but no verbalizing.  Code stroke was called.  Upon entering room patient was not verbalizing and then was noted to have left eye deviation and left arm stiffing.  This was followed by a short period of time where she was not following commands.  She was brought to CT where she slowly began to talk and able to move her extremities.     Date last known well: Date: 10/28/2014 Time last known well: Time: 13:35 tPA Given: No: seizure at onset No Symptoms         0  Modified Rankin: Rankin Score=0    Past Medical History  Diagnosis Date  . 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 PAD (peripheral artery disease)     s/p L fem pop 11/2013 in setting of 1st toe osteo/gangrene  . GERD (gastroesophageal reflux disease)   . Dizziness     ? CVA 01/2010 - carotid dopplers with no ICA stenosis  . Arnold-Chiari malformation, type I     MRI brain 02/2010  . Bradycardia   . Tobacco abuse     quit 04/2013  . DEPRESSION   . HYPERLIPIDEMIA   . HYPERTENSION   . CAD (coronary artery disease)     a. NSTEMI 07/2009: LHC - D2 40%, LAD irreg., EF 50% with apical AK (tako-tsubo CM);  b. Inf STEMI (04/2013): Tx Promus DES to CFX;  c. 08/2013 Lexi CL: No ischemia, dist ant/ap/inf-ap infarct, EF   . SLE (systemic lupus erythematosus) dx 05/2013    follows with rheum  Dareen Piano)  . Sjogren's syndrome 06/02/13    pt denies this (12/01/13)  . Abnormal LFTs     08/2013 Lipitor 40 d/c'd and crestor 10 started   . Anxiety   . RA (rheumatoid arthritis)     prior tx by Dr. Dareen Piano  . Anemia     as a young woman  . Myocardial infarction     Past Surgical History  Procedure Laterality Date  . Coronary angioplasty  04/2013  . Tubal ligation    . Colonoscopy    . Femoral-popliteal bypass graft Left 12/02/2013    Procedure:  LEFT FEMORAL-BELOW KNEE POPLITEAL ARTERY BYPASS GRAFT;  Surgeon:  Sherren Kerns, MD;  Location: [facility] OR;  Service: Vascular;  Laterality: Left;  . Amputation Left 12/02/2013    Procedure: LEFT 1ST TOE AMPUTATION;  Surgeon: Sherren Kerns, MD;  Location: [facility] OR;  Service: Vascular;  Laterality: Left;  . Left heart catheterization with coronary angiogram N/A 05/19/2013    Procedure: LEFT HEART CATHETERIZATION WITH CORONARY ANGIOGRAM;  Surgeon: Micheline Chapman, MD;  Location: [facility] CATH LAB;  Service: Cardiovascular;  Laterality: N/A;  . Percutaneous coronary stent intervention (pci-s)  05/19/2013    Procedure: PERCUTANEOUS CORONARY STENT INTERVENTION (PCI-S);  Surgeon: Micheline Chapman, MD;  Location: [facility] CATH LAB;  Service: Cardiovascular;;  . Abdominal aortagram N/A 11/24/2013    Procedure: ABDOMINAL AORTAGRAM WITH BILATERAL RUNOFF WITH POSSIBLE INTERVENTION;  Surgeon: Chuck Hint, MD;  Location: [facility] CATH LAB;  Service: Cardiovascular;  Laterality: N/A;    Family History  Problem Relation Age of Onset  . Arthritis Mother   . Emphysema Mother   . Arthritis Father   . COPD Father   . Diabetes Other    Social History:  reports that she quit smoking about 17 months ago. Her smoking use included Cigarettes. She has never used smokeless tobacco. She reports that she drinks alcohol. She reports that she does not use illicit drugs.  Allergies:  Allergies  Allergen Reactions  . Brilinta [Ticagrelor] Shortness Of Breath    Also chest  tightness    Medications:                                                                                                                           Prior to Admission:  Prescriptions prior to admission  Medication Sig Dispense Refill Last Dose  . aspirin 81 MG tablet Take 1 tablet (81 mg total) by mouth daily. 30 tablet  10/26/2014 at Unknown time  . lisinopril (PRINIVIL,ZESTRIL) 20 MG tablet TAKE 1 TABLET BY MOUTH DAILY 30 tablet 5 Past Month  . nitroGLYCERIN (NITROSTAT) 0.4 MG SL tablet Place 0.4 mg under the tongue every 5 (five) minutes as needed for chest pain.   Not Taking at Unknown time   Scheduled: . aspirin EC  81 mg Oral Daily  . feeding supplement (ENSURE COMPLETE)  237 mL Oral BID BM  . LORazepam      . metoprolol tartrate  25 mg Oral BID  . off the beat book   Does not apply Once  . pantoprazole  40 mg Oral Q0600  . rosuvastatin  10 mg Oral Daily  . sodium chloride  3 mL Intravenous Q12H   Continuous: . sodium chloride 75 mL/hr at 10/28/14 1200   JYN:WGNFAO chloride, acetaminophen, ALPRAZolam, nitroGLYCERIN, ondansetron (ZOFRAN) IV, sodium chloride, zolpidem  ROS:  History obtained from unobtainable from patient due to mental status   Neurologic Examination:                                                                                                      Blood pressure 139/72, pulse 62, temperature 98 F (36.7 C), temperature source Oral, resp. rate 16, height 5\' 6"  (1.676 m), weight 53.071 kg (117 lb), SpO2 99 %.  Eyes: non-injected sclera HEENT-  Normocephalic, no lesions, without obvious abnormality.  Normal external eye and conjunctiva.  Normal TM's bilaterally.  Normal auditory canals and external ears. Normal external nose, mucus membranes and septum.  Normal pharynx. Cardiovascular- regular rate and rhythm, pulses palpable  throughout   Lungs- no tachypnea, retractions or cyanosis, Heart exam - S1, S2 normal, no murmur, no gallop, rate regular Abdomen- soft, non-tender; bowel sounds normal; no masses,  no organomegaly Extremities- no edema Musculoskeletal-no joint tenderness, deformity or swelling, no muscular tenderness noted, not examined Skin-warm and dry, no hyperpigmentation, vitiligo, or suspicious lesions  Neurological Examination Mental Status: Alert, oriented to place only, though improving.  Speech fluent without evidence of aphasia by the end, initally, no speech output but would follow commands.  Cranial Nerves: II: Discs flat bilaterally; Visual fields grossly normal, pupils equal, round, reactive to light and accommodation III,IV, VI: ptosis not present, extra-ocular motions intact bilaterally V,VII: smile symmetric, facial light touch sensation normal bilaterally VIII: hearing normal bilaterally IX,X: gag reflex present XI: bilateral shoulder shrug XII: midline tongue extension Motor: Right : Upper extremity   5/5    Left:     Upper extremity   5/5  Lower extremity   5/5     Lower extremity   5/5 Tone and bulk:normal tone throughout; no atrophy noted Sensory: Pinprick and light touch intact throughout, bilaterally Deep Tendon Reflexes: 2+ and symmetric throughout Plantars: Right: downgoing   Left: downgoing Cerebellar: normal finger-to-nose, normal rapid alternating movements and normal heel-to-shin test Gait: normal gait and station   Lab Results: Basic Metabolic Panel:  Recent Labs Lab 10/27/14 1108 10/27/14 1603 10/28/14 0351  NA 134*  --  136  K 4.0  --  3.8  CL 101  --  108  CO2 26  --  22  GLUCOSE 144*  --  108*  BUN 10  --  9  CREATININE 1.18*  --  0.77  CALCIUM 9.5  --  8.5  MG  --  1.8  --     Liver Function Tests:  Recent Labs Lab 10/27/14 1108  AST 24  ALT 13  ALKPHOS 89  BILITOT 0.9  PROT 9.3*  ALBUMIN 3.5   No results for input(s): LIPASE, AMYLASE  in the last 168 hours. No results for input(s): AMMONIA in the last 168 hours.  CBC:  Recent Labs Lab 10/27/14 1108 10/28/14 0351  WBC 8.0 5.8  NEUTROABS 5.0  --   HGB 16.4* 12.2  HCT 48.4* 38.2  MCV 75.6* 77.3*  PLT 396 359    Cardiac Enzymes:  Recent Labs Lab 10/27/14 1108 10/27/14 1603 10/27/14 2159  TROPONINI 0.04* 0.05* <0.03    Lipid Panel:  Recent Labs Lab 10/28/14 0351  CHOL 132  TRIG 74  HDL 38*  CHOLHDL 3.5  VLDL 15  LDLCALC 79    CBG: No results for input(s): GLUCAP in the last 168 hours.  Microbiology: Results for orders placed or performed during the hospital encounter of 12/01/13  Surgical pcr screen     Status: None   Collection Time: 12/01/13  1:00 PM  Result Value Ref Range Status   MRSA, PCR NEGATIVE NEGATIVE Final   Staphylococcus aureus NEGATIVE NEGATIVE Final    Comment:        The Xpert SA Assay (FDA approved for NASAL specimens in patients over 61 years of age), is one component of a comprehensive surveillance program.  Test performance has been validated by [employer] for patients greater than or equal to 20 year old. It is not intended to diagnose infection nor to guide or monitor treatment.    Coagulation Studies:  Recent Labs  10/28/14 0351  LABPROT 14.8  INR 1.15    Imaging: Ct Head Wo Contrast  10/28/2014   CLINICAL DATA:  A phasic stroke. Aphasia and seizure-like activity after cardiac catheterization.  EXAM: CT HEAD WITHOUT CONTRAST  TECHNIQUE: Contiguous axial images were obtained from the base of the skull through the vertex without intravenous contrast.  COMPARISON:  MRI brain 03/17/2010. CT head without contrast 02/18/2010.  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 The basal ganglia are intact. The insular ribbon is intact bilaterally. No acute cortical infarct is present. Other subcortical white matter changes are stable.  The ventricles are of normal size.  No significant extraaxial fluid collection is present.  The paranasal sinuses and mastoid air cells are clear. The calvarium is intact.  IMPRESSION: 1. 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 2. Scattered subcortical white matter changes bilaterally likely reflect the sequela of chronic microvascular ischemia. 3. No acute intracranial abnormality.   Electronically Signed   By: Marin Roberts M.D.   On: 10/28/2014 14:33       Assessment and plan discussed with with attending physician and they are in agreement.    Felicie Morn PA-C [facility] [phone]  10/28/2014, 2:41 PM   Assessment: 59 y.o. female with new onset partial seizure. It is unclear what the precipitant was in her case, but she will need further investigation. If she were to have any further events, she would need to be started on an AED.   Stroke Risk Factors - hyperlipidemia and hypertension  1) Keppra 500mg  BID if any further events occur.  2) EEG 3) MRI brain 4) will continue to follow.  5) Ativan for any event lasting longer than 3 minutes, or multiple events without return to baseline.   , MD [facility] [phone]  If 7pm- 7am, please page neurology on call as listed in [facility]ION.

## 2014-10-28 NOTE — Progress Notes (Signed)
Nutrition Brief Note  Patient identified on the Malnutrition Screening Tool (MST) Report for 3 lb weight loss in 1 week. No significant weight loss noted on review of usual weights below.  Wt Readings from Last 15 Encounters:  10/28/14 117 lb (53.071 kg)  10/27/14 114 lb 12.8 oz (52.073 kg)  10/23/14 117 lb (53.071 kg)  07/15/14 115 lb (52.164 kg)  04/02/14 117 lb 9.6 oz (53.343 kg)  01/02/14 119 lb 12.8 oz (54.341 kg)  12/30/13 118 lb (53.524 kg)  12/18/13 117 lb (53.071 kg)  12/02/13 117 lb 12.8 oz (53.434 kg)  12/01/13 117 lb 12.8 oz (53.434 kg)  11/21/13 119 lb (53.978 kg)  11/12/13 117 lb (53.071 kg)  11/07/13 118 lb 12.8 oz (53.887 kg)  09/16/13 122 lb 1.9 oz (55.393 kg)  09/02/13 121 lb (54.885 kg)    Body mass index is 18.89 kg/(m^2). Patient meets criteria for normal weight based on current BMI.   Current diet order is NPO for procedure. Labs and medications reviewed.   No nutrition interventions warranted at this time. If nutrition issues arise, please consult RD.   Joaquin Courts, RD, LDN, CNSC Pager [phone] After Hours Pager [phone]

## 2014-10-28 NOTE — Progress Notes (Addendum)
Entering room to give BP medicine patient refused Metoprolol saying "I did not come in here for high blood pressure" then begin asking for medicine prescribed by her physician. Staff informed patient of scheduled evening medications. Patient then begin to C/O chest pain was 5/10 refused sublingual NTG she had taken her own.  Patient refused to allow this Nurse to care for her, informed charge nurse.  Charge Nurse was able to give Nitroglycerin, Morphine and Zofran to patient.

## 2014-10-28 NOTE — Interval H&P Note (Signed)
History and Physical Interval Note:  10/28/2014 10:52 AM  Mercedes [name]  has presented today for surgery, with the diagnosis of cp  The various methods of treatment have been discussed with the patient and family. After consideration of risks, benefits and other options for treatment, the patient has consented to  Procedure(s): LEFT HEART CATHETERIZATION WITH CORONARY ANGIOGRAM (N/A) as a surgical intervention .  The patient's history has been reviewed, patient examined, no change in status, stable for surgery.  I have reviewed the patient's chart and labs.  Questions were answered to the patient's satisfaction.     Lorine Bears

## 2014-10-28 NOTE — Significant Event (Signed)
Rapid Response Event Note  Overview: Time Called: 1356 Arrival Time: 1359 Event Type: Neurologic  Initial Focused Assessment: Per staff patient alert and at baseline at 1345.  Then she began to have problems communicating. Per PA patient with left weakness and difficulty speaking.  Code Stroke called at 1400 Upon my arrival patient with difficulty moving all extremities. Not normally responsive. Patient began to have seizure activity with a gaze preference. Seizure activity resolved with out treatment.   Interventions: Patient transported to radiology for head CT Upon return from room  Patient remains confused but otherwise at baseline. Dr Amada Jupiter at bedside to assess patient.  See note.  Code Stroke canceled. Rn to call if assistance needed.  Event Summary: Name of Physician Notified: Noralyn Pick PA at bedside at    Name of Consulting Physician Notified: Dr Amada Jupiter at 1400  Outcome: Stayed in room and stabalized  Event End Time: 1430  Mercedes [name]

## 2014-10-28 NOTE — CV Procedure (Signed)
   Cardiac Catheterization Procedure Note  Name: Mercedes [name] MRN: [id number] DOB: 07/13/1956  Procedure: Left Heart Cath, Selective Coronary Angiography, LV angiography  Indication:  Chest pain in the setting of atrial flutter with tachycardia. Mildly elevated troponin. Known history of coronary artery disease with previous stenting of the left circumflex  Medications:  Sedation:  1 mg IV Versed, 25 mcg IV Fentanyl  Contrast:  60 mL Omnipaque   Procedural Details: The right wrist was prepped, draped, and anesthetized with 1% lidocaine. Using the modified Seldinger technique, a 5 French sheath was introduced into the right radial artery. 3 mg of verapamil was administered through the sheath, weight-based unfractionated heparin was administered intravenously. A Jackie catheter was used for selective coronary angiography. A pigtail catheter was used for left ventriculography. Catheter exchanges were performed over an exchange length guidewire. There were no immediate procedural complications. A TR band was used for radial hemostasis at the completion of the procedure.  The patient was transferred to the post catheterization recovery area for further monitoring.  Procedural Findings:  Hemodynamics: AO:  131/61   mmHg LV:  135 over 3    mmHg LVEDP: 11  mmHg  Coronary angiography: Coronary dominance: Right   Left Main:  Normal in size with no significant disease.  Left Anterior Descending (LAD):  Normal in size and mildly calcified. It is jailed by the ostial left circumflex stent with 30% ostial stenosis which does not seem to be flow-limiting. There is 40% disease in the mid to distal segment.  1st diagonal (D1):  Medium in size with no significant disease.  2nd diagonal (D2):  Normal in size with 20% ostial stenosis.  3rd diagonal (D3):  Very small in size.  Circumflex (LCx):  Normal in size and nondominant. The ostial stent is patent with minimal restenosis. The stent extends  few millimeters to the left main and jails the LAD. The rest of the vessel has minor irregularities.  1st obtuse marginal:  Small in size with minor irregularities.  2nd obtuse marginal:  Normal in size with minor irregularities.  3rd obtuse marginal:  Medium in size with minor irregularities.   AV groove continuation segment: Small in size with no significant disease.   Right Coronary Artery: Normal in size with minor irregularities. There is diffuse 20% disease in the midsegment.  Posterior descending artery: Medium in size with minor irregularities.  Posterior AV segment: Small in size with no significant disease.  Posterolateral branchs:  The PL branches are overall  Left ventriculography: Left ventricular systolic function is normal , LVEF is estimated at 55 %, there is no significant mitral regurgitation . There is severe hypokinesis of the distal inferior apical wall  Final Conclusions:   1. Widely patent ostial left circumflex stent. However, the stent extends into the left main and reveals the LAD with 30% ostial LAD stenosis. This does not appear to be flow-limiting. No evidence of obstructive coronary artery disease. 2. Normal LV systolic function with inferior apical hypokinesis consistent with prior myocardial infarction. Ejection fraction is 55%. 3. Normal LV end-diastolic pressure.  Recommendations:  Continue medical therapy. Anticoagulation with Eliquis can be started tomorrow. However, given that the left circumflex stent extends into the left main coronary artery and jails the LAD, I favor continued small dose aspirin 81 mg.  Lorine Bears MD, [facility] 10/28/2014, 11:28 AM

## 2014-10-28 NOTE — Progress Notes (Signed)
ANTICOAGULATION CONSULT NOTE - Follow Up Consult  Pharmacy Consult for heparin Indication: chest pain/ACS and Aflutter  Labs:  Recent Labs  10/27/14 1108 10/27/14 1603 10/27/14 2020 10/27/14 2159 10/28/14 0351  HGB 16.4*  --   --   --   --   HCT 48.4*  --   --   --   --   PLT 396  --   --   --   --   HEPARINUNFRC  --   --  0.21*  --  0.58  CREATININE 1.18*  --   --   --   --   TROPONINI 0.04* 0.05*  --  <0.03  --     Assessment/Plan:  59yo female therapeutic on heparin after rate increase. Will continue gtt at current rate and confirm stable with additional level.   Vernard Gambles, PharmD, BCPS  10/28/2014,5:00 AM

## 2014-10-28 NOTE — Progress Notes (Signed)
    Subjective:  Denies CP or dyspnea   Objective:  Filed Vitals:   10/27/14 1430 10/27/14 1459 10/27/14 2054 10/28/14 0500  BP: 120/73 133/72 131/89 140/85  Pulse: 78 83 62 64  Temp:  98.1 F (36.7 C) 98.9 F (37.2 C) 98 F (36.7 C)  TempSrc:  Oral Oral Oral  Resp: 18 16 18 18   Height:  5\' 6"  (1.676 m)    Weight:  117 lb 4.8 oz (53.207 kg)  117 lb (53.071 kg)  SpO2: 99% 96% 100% 98%    Intake/Output from previous day:  Intake/Output Summary (Last 24 hours) at 10/28/14 0926 Last data filed at 10/28/14 0700  Gross per 24 hour  Intake      0 ml  Output    975 ml  Net   -975 ml    Physical Exam: Physical exam: Well-developed well-nourished in no acute distress.  Skin is warm and dry.  HEENT is normal.  Neck is supple.  Chest is clear to auscultation with normal expansion.  Cardiovascular exam is regular rate and rhythm.  Abdominal exam nontender or distended. No masses palpated. Extremities show no edema. neuro grossly intact    Lab Results: Basic Metabolic Panel:  Recent Labs  12/28/14 1108 10/27/14 1603 10/28/14 0351  NA 134*  --  136  K 4.0  --  3.8  CL 101  --  108  CO2 26  --  22  GLUCOSE 144*  --  108*  BUN 10  --  9  CREATININE 1.18*  --  0.77  CALCIUM 9.5  --  8.5  MG  --  1.8  --    CBC:  Recent Labs  10/27/14 1108 10/28/14 0351  WBC 8.0 5.8  NEUTROABS 5.0  --   HGB 16.4* 12.2  HCT 48.4* 38.2  MCV 75.6* 77.3*  PLT 396 359   Cardiac Enzymes:  Recent Labs  10/27/14 1108 10/27/14 1603 10/27/14 2159  TROPONINI 0.04* 0.05* <0.03     Assessment/Plan:  1 chest pain-patient had jaw pain with her atrial flutter. Her enzymes are minimally elevated and I do not think diagnostic of myocardial infarction. She is for cardiac catheterization today. The risks and benefits were discussed and she agrees to proceed. 2 atrial flutter-I have reviewed the patient's electrocardiogram from admission. She was in atrial flutter but has converted  to sinus rhythm. CHADS vasc 3. Continue beta blocker. Check echocardiogram and TSH. If catheterization shows no lesions needing PCI would discontinue antiplatelet agents and begin apixaban 5 mg BID. We'll then arrange outpatient evaluation by electrophysiology for consideration of ablation and anticoagulation could be discontinued to follow. 3 coronary artery disease-continue statin. 4 hypertension-continue metoprolol. 5 hyperlipidemia-continue statin. 6 PAD-continue statin. If catheterization okay and no further arrhythmias plan discharge tomorrow morning with outpatient electrophysiology follow-up. 12/27/14 10/28/2014, 9:26 AM

## 2014-10-28 NOTE — H&P (View-Only) (Signed)
    Subjective:  Denies CP or dyspnea   Objective:  Filed Vitals:   10/27/14 1430 10/27/14 1459 10/27/14 2054 10/28/14 0500  BP: 120/73 133/72 131/89 140/85  Pulse: 78 83 62 64  Temp:  98.1 F (36.7 C) 98.9 F (37.2 C) 98 F (36.7 C)  TempSrc:  Oral Oral Oral  Resp: 18 16 18 18  Height:  5' 6" (1.676 m)    Weight:  117 lb 4.8 oz (53.207 kg)  117 lb (53.071 kg)  SpO2: 99% 96% 100% 98%    Intake/Output from previous day:  Intake/Output Summary (Last 24 hours) at 10/28/14 0926 Last data filed at 10/28/14 0700  Gross per 24 hour  Intake      0 ml  Output    975 ml  Net   -975 ml    Physical Exam: Physical exam: Well-developed well-nourished in no acute distress.  Skin is warm and dry.  HEENT is normal.  Neck is supple.  Chest is clear to auscultation with normal expansion.  Cardiovascular exam is regular rate and rhythm.  Abdominal exam nontender or distended. No masses palpated. Extremities show no edema. neuro grossly intact    Lab Results: Basic Metabolic Panel:  Recent Labs  10/27/14 1108 10/27/14 1603 10/28/14 0351  NA 134*  --  136  K 4.0  --  3.8  CL 101  --  108  CO2 26  --  22  GLUCOSE 144*  --  108*  BUN 10  --  9  CREATININE 1.18*  --  0.77  CALCIUM 9.5  --  8.5  MG  --  1.8  --    CBC:  Recent Labs  10/27/14 1108 10/28/14 0351  WBC 8.0 5.8  NEUTROABS 5.0  --   HGB 16.4* 12.2  HCT 48.4* 38.2  MCV 75.6* 77.3*  PLT 396 359   Cardiac Enzymes:  Recent Labs  10/27/14 1108 10/27/14 1603 10/27/14 2159  TROPONINI 0.04* 0.05* <0.03     Assessment/Plan:  1 chest pain-patient had jaw pain with her atrial flutter. Her enzymes are minimally elevated and I do not think diagnostic of myocardial infarction. She is for cardiac catheterization today. The risks and benefits were discussed and she agrees to proceed. 2 atrial flutter-I have reviewed the patient's electrocardiogram from admission. She was in atrial flutter but has converted  to sinus rhythm. CHADS vasc 3. Continue beta blocker. Check echocardiogram and TSH. If catheterization shows no lesions needing PCI would discontinue antiplatelet agents and begin apixaban 5 mg BID. We'll then arrange outpatient evaluation by electrophysiology for consideration of ablation and anticoagulation could be discontinued to follow. 3 coronary artery disease-continue statin. 4 hypertension-continue metoprolol. 5 hyperlipidemia-continue statin. 6 PAD-continue statin. If catheterization okay and no further arrhythmias plan discharge tomorrow morning with outpatient electrophysiology follow-up. Brian Crenshaw 10/28/2014, 9:26 AM    

## 2014-10-28 NOTE — Progress Notes (Addendum)
Was notified of pt needing something at 1335. Walked into room 1337, pt reported that she was not, "feeling well". Pt states, "I am dizzy and don't feel well". Skin dry, neuro WNL, vital signs obtained all WNL. TR band removed at this time. R Radial level 0. I told patient that all vitals and assessment WNL and asked if she needed to have a BM. Pt responded that she had a BM this AM. Pt then stated, "you're really pissing me off and I don't feel well". I then told the patient that I would have someone else come assess her. 1350 I went to speak to my Charge RN who told me that Barrett, PA was on the floor. I told Barrett, PA what had happened and asked if she could assess the pt.   1355 Darinda Barrett came to assess pt and found pt nonverbal with deficits. RR called and notified. Code Stroke called at this time.   Ulice Dash, Charity fundraiser

## 2014-10-28 NOTE — Progress Notes (Signed)
Requested by RN to evaluate patient.  [name] had been complaining of general malaise, no specific complaints. She denied shortness of breath or chest pain. There was no specific complaint of pain. Her vital signs were stable. RN requested I assess patient.  Upon entering the room, [name] was not verbally responsive. She was able to move her left arm slightly and had weak grip strength. She could move both of her legs slightly, and both feet, but was unable to lift either leg. Because she was nonverbal, we were unable to assess sensation. However, she did move the requested extremity, i.e. if you held her hand and asked her to squeeze, she would attempt to move that hand. She was awake and appeared to comprehend speech but was not able to speak so it's not clear how much she understood of what was being said. Her daughter was in the room.  Rapid response and code stroke were activated. After the arrival of the neurologist, she appeared to have some seizure activity but this resolved in less than 1 minute.  The neurologist was on scene when she was taken down to CT and went with her.  Dr. Kirke Corin was updated on the situation.  [name]'s daughter was updated on her condition.  Theodore Demark, PA-C 10/28/2014 2:32 PM Beeper [phone]

## 2014-10-29 ENCOUNTER — Inpatient Hospital Stay (HOSPITAL_COMMUNITY): Payer: Medicare Other

## 2014-10-29 DIAGNOSIS — I480 Paroxysmal atrial fibrillation: Secondary | ICD-10-CM

## 2014-10-29 DIAGNOSIS — I4892 Unspecified atrial flutter: Secondary | ICD-10-CM

## 2014-10-29 DIAGNOSIS — I495 Sick sinus syndrome: Principal | ICD-10-CM

## 2014-10-29 DIAGNOSIS — R569 Unspecified convulsions: Secondary | ICD-10-CM | POA: Insufficient documentation

## 2014-10-29 LAB — URINALYSIS, ROUTINE W REFLEX MICROSCOPIC
BILIRUBIN URINE: NEGATIVE
Glucose, UA: NEGATIVE mg/dL
HGB URINE DIPSTICK: NEGATIVE
KETONES UR: NEGATIVE mg/dL
Nitrite: NEGATIVE
PROTEIN: NEGATIVE mg/dL
SPECIFIC GRAVITY, URINE: 1.023 (ref 1.005–1.030)
Urobilinogen, UA: 0.2 mg/dL (ref 0.0–1.0)
pH: 5.5 (ref 5.0–8.0)

## 2014-10-29 LAB — CBC
HEMATOCRIT: 38 % (ref 36.0–46.0)
Hemoglobin: 12.4 g/dL (ref 12.0–15.0)
MCH: 24.7 pg — ABNORMAL LOW (ref 26.0–34.0)
MCHC: 32.6 g/dL (ref 30.0–36.0)
MCV: 75.5 fL — ABNORMAL LOW (ref 78.0–100.0)
PLATELETS: 382 10*3/uL (ref 150–400)
RBC: 5.03 MIL/uL (ref 3.87–5.11)
RDW: 15.5 % (ref 11.5–15.5)
WBC: 7.5 10*3/uL (ref 4.0–10.5)

## 2014-10-29 LAB — BASIC METABOLIC PANEL
ANION GAP: 8 (ref 5–15)
BUN: 7 mg/dL (ref 6–23)
CALCIUM: 8.6 mg/dL (ref 8.4–10.5)
CHLORIDE: 100 mmol/L (ref 96–112)
CO2: 25 mmol/L (ref 19–32)
CREATININE: 1.03 mg/dL (ref 0.50–1.10)
GFR calc Af Amer: 67 mL/min — ABNORMAL LOW (ref >90)
GFR calc non Af Amer: 58 mL/min — ABNORMAL LOW (ref >90)
Glucose, Bld: 180 mg/dL — ABNORMAL HIGH (ref 70–99)
POTASSIUM: 3.9 mmol/L (ref 3.5–5.1)
Sodium: 133 mmol/L — ABNORMAL LOW (ref 135–145)

## 2014-10-29 LAB — URINE MICROSCOPIC-ADD ON

## 2014-10-29 LAB — TSH: TSH: 0.571 u[IU]/mL (ref 0.350–4.500)

## 2014-10-29 MED ORDER — METOPROLOL TARTRATE 1 MG/ML IV SOLN
2.5000 mg | Freq: Four times a day (QID) | INTRAVENOUS | Status: DC | PRN
  Administered 2014-10-29: 2.5 mg via INTRAVENOUS
  Filled 2014-10-29 (×2): qty 5

## 2014-10-29 MED ORDER — APIXABAN 5 MG PO TABS
5.0000 mg | ORAL_TABLET | Freq: Two times a day (BID) | ORAL | Status: DC
  Administered 2014-10-29 – 2014-11-02 (×9): 5 mg via ORAL
  Filled 2014-10-29 (×9): qty 1

## 2014-10-29 MED ORDER — METOPROLOL TARTRATE 1 MG/ML IV SOLN
5.0000 mg | Freq: Once | INTRAVENOUS | Status: AC
  Administered 2014-10-29: 5 mg via INTRAVENOUS
  Filled 2014-10-29: qty 5

## 2014-10-29 NOTE — Progress Notes (Signed)
Hager PA returned call. No new orders received. Will continue to monitor pt.

## 2014-10-29 NOTE — Progress Notes (Signed)
Paged Hager PA to let him know that pt experienced a 6.14s pause while in Echo. No new orders received.

## 2014-10-29 NOTE — Progress Notes (Signed)
Subjective: Weak with some SOB  Objective: Vital signs in last 24 hours: Temp:  [97.8 F (36.6 C)-100.9 F (38.3 C)] 100.9 F (38.3 C) (03/03 0400) Pulse Rate:  [58-86] 59 (03/03 0400) Resp:  [13-26] 21 (03/03 0545) BP: (106-169)/(57-107) 123/77 mmHg (03/03 0545) SpO2:  [93 %-100 %] 93 % (03/03 0400) Weight:  [111 lb 11.2 oz (50.667 kg)] 111 lb 11.2 oz (50.667 kg) (03/03 0400) Last BM Date: 10/26/14  Intake/Output from previous day:   Intake/Output this shift:    Medications Current Facility-Administered Medications  Medication Dose Route Frequency Provider Last Rate Last Dose  . 0.9 %  sodium chloride infusion  250 mL Intravenous PRN Abelino Derrick, PA-C      . acetaminophen (TYLENOL) tablet 650 mg  650 mg Oral Q4H PRN Abelino Derrick, PA-C      . ALPRAZolam Prudy Feeler) tablet 0.25 mg  0.25 mg Oral BID PRN Abelino Derrick, PA-C      . aspirin EC tablet 81 mg  81 mg Oral Daily Abelino Derrick, PA-C   81 mg at 10/28/14 0817  . feeding supplement (ENSURE COMPLETE) (ENSURE COMPLETE) liquid 237 mL  237 mL Oral BID BM Lewayne Bunting, MD   237 mL at 10/28/14 1000  . metoprolol tartrate (LOPRESSOR) tablet 25 mg  25 mg Oral BID Abelino Derrick, [place]   Stopped at 10/29/14 7564  . nitroGLYCERIN (NITROSTAT) SL tablet 0.4 mg  0.4 mg Sublingual Q5 Min x 3 PRN Abelino Derrick, PA-C   0.4 mg at 10/28/14 2255  . ondansetron (ZOFRAN) injection 4 mg  4 mg Intravenous Q6H PRN Abelino Derrick, PA-C   4 mg at 10/28/14 2314  . pantoprazole (PROTONIX) EC tablet 40 mg  40 mg Oral Q0600 Abelino Derrick, PA-C   40 mg at 10/27/14 1535  . rosuvastatin (CRESTOR) tablet 10 mg  10 mg Oral Daily Abelino Derrick, PA-C   10 mg at 10/28/14 1000  . sodium chloride 0.9 % injection 3 mL  3 mL Intravenous Q12H Luke K Kilroy, PA-C   3 mL at 10/27/14 1400  . sodium chloride 0.9 % injection 3 mL  3 mL Intravenous PRN Eda Paschal Kilroy, PA-C      . zolpidem (AMBIEN) tablet 5 mg  5 mg Oral QHS PRN Abelino Derrick, PA-C        PE: General  appearance: alert, cooperative and mild distress Lungs: clear to auscultation bilaterally Heart: irregularly irregular rhythm and Rate fast.  No MM Extremities: No LEE Pulses: 2+ and symmetric Skin: Warm and dry Neurologic: Grossly normal  Lab Results:   Recent Labs  10/27/14 1108 10/28/14 0351 10/29/14 0450  WBC 8.0 5.8 7.5  HGB 16.4* 12.2 12.4  HCT 48.4* 38.2 38.0  PLT 396 359 382   BMET  Recent Labs  10/27/14 1108 10/28/14 0351  NA 134* 136  K 4.0 3.8  CL 101 108  CO2 26 22  GLUCOSE 144* 108*  BUN 10 9  CREATININE 1.18* 0.77  CALCIUM 9.5 8.5   PT/INR  Recent Labs  10/28/14 0351 10/28/14 1417  LABPROT 14.8 14.4  INR 1.15 1.10   Cholesterol  Recent Labs  10/28/14 0351  CHOL 132   Lipid Panel     Component Value Date/Time   CHOL 132 10/28/2014 0351   TRIG 74 10/28/2014 0351   HDL 38* 10/28/2014 0351   CHOLHDL 3.5 10/28/2014 0351   VLDL 15 10/28/2014 0351  LDLCALC 79 10/28/2014 0351      Assessment/Plan    Unstable angina-jaw pain SP LHC revealing widely patent ostial left circumflex stent. However, the stent extends into the left main and reveals the LAD with 30% ostial LAD stenosis. This does not appear to be flow-limiting. No evidence of obstructive coronary artery disease.  Normal LV systolic function with inferior apical hypokinesis consistent with prior myocardial infarction. Ejection fraction is 55%. Normal LV end-diastolic pressure.     ASA, lopressor 25 bid, crestor     CAD - S/P Takotsubo MI in 2000, CFX DES Sept 2014    Partial seizure yesterday. No acute abnormality seen on MRI.  Chronic microvascular ischemia.  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     Atrial flutter with rapid ventricular response vs. SVT Continues in and out of afib/flutter with RVR.  Bigeminy also noted on tele.  Rate right now 150's.  RN gave PO lopressor while I was in there.   Will add PRN.  Will start Eliquis 5mg  BID.      Echo pending.  Recommend EP eval today.  BMET pending.    Febrile  Low grade temp-100.9 at 0400 and still 100.8.  WBCs WNL.  Will draw blood cultures.        Elevated troponin  Peaked at 0.05. Likely demand ischemia form RVR.   Dyslipidemia  Crestor    TOBACCO ABUSE   Essential hypertension  BP ok this morning.  Up and down.  Refusing meds.    PVD- s/p Lt FP April 2015   SLE (systemic lupus erythematosus)   Abnormal LFTs on Lipitor- changed to Crestor but pt not compliant        Noncompliance with medication regimen  Patient is refusing medications while in the hospital.    LOS: 2 days    HAGER, BRYAN PA-C 10/29/2014 7:49 AM  As above, patient seen and examined. She denies chest pain, dyspnea or palpitations. Cath results noted. Plan medical therapy with aspirin and statin. She continues to have paroxysms of atrial fibrillation and flutter on telemetry. She is also now having posttermination pauses. CHADSvasc 3; apixaban initiated; echocardiogram pending. TSH normal. We'll continue metoprolol. I will ask electrophysiology to review. 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 We couldn't push her AV nodal blocking agents versus consider an antiarrhythmic. She does have a low-grade fever. Mild dysuria. Check UA. Neurology is seen for recent partial seizure. 12/29/2014

## 2014-10-29 NOTE — Progress Notes (Signed)
  Echocardiogram 2D Echocardiogram has been performed.  Cathie Beams 10/29/2014, 9:54 AM

## 2014-10-29 NOTE — Progress Notes (Signed)
PHARMACY - Apixaban Education  Spoke with patient about starting apixaban. I discussed her copay of $45 and asked if this is financially feasible for her. I told her I did not want her to go home on a medication she was not going to take. We also discussed the option of warfarin and that the medication cost is lower but there would be lab monitoring at least monthly which would be associated with a copay. In the end, she agrees to take apixaban.  Patient is concerned about clopidogrel being stopped without speaking with her vein doctor, Dr. Darrick Penna. I attempted to explain why clopidogrel was stopped but believe she needs to hear this from her physicians.  [place], [address].D., BCPS Clinical Pharmacist Pager: [phone] 10/29/2014 1:40 PM

## 2014-10-29 NOTE — Procedures (Signed)
ELECTROENCEPHALOGRAM REPORT  Patient: Mercedes [name]       Room #: 8L57 EEG No. ID: [id number] Age: 59 y.o.        Sex: female Referring Physician: Velta Addison Report Date:  10/29/2014        Interpreting Physician: Aline Brochure  History: Mercedes [name] is an 59 y.o. female who underwent cardiac catheterization following which she was noted to the unable to speak, and subsequently had eye deviation and stiffening of her left arm. Deficits resolved without intervention.  Indications for study:  Rule out new-onset seizure disorder.  Technique: This is an 18 channel routine scalp EEG performed at the bedside with bipolar and monopolar montages arranged in accordance to the international 10/20 system of electrode placement.   Description:This EEG recording was performed during wakefulness and during sleep. Predominant activity during wakefulness consisted of 9-10  Hz alpha rhythm with good attenuation with eye opening. Photic stimulation produced symmetrical occipital driving response.  Hyperventilation was not performed. There was symmetrical slowing of background activity with mixed irregular delta and theta activity diffusely. Symmetrical vertex waves, sleep spindles and K-complexes are recorded during stage II of sleep. No epileptiform discharges occurred during wakefulness nor during sleep. There was no abnormal slowing of cerebral activity.  Interpretation: This is a normal EEG recording during wakefulness and during sleep. No evidence of an epileptic disorder was demonstrated. However, a normal EEG recording in and of itself does not rule out seizure disorder.  Venetia Maxon M.D. [facility] [phone]

## 2014-10-29 NOTE — Progress Notes (Signed)
Subjective: No further seizures, returned to baseline.   Exam: Filed Vitals:   10/29/14 0825  BP: 116/66  Pulse:   Temp: 100.8 F (38.2 C)  Resp: 16   Gen: In bed, NAD MS: Awake, alert, oriented and appropriate.  XJ:OITGP, EOMI Motor: MAEW Sensory:intact to LT  Pertinent Labs: BMP - unremarkable.   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 I discussed that I felt that she was at significant risk for recurrent seizure(likley 60 - 70%) if her EEG is normal. In this setting I offered AED medications, however she would prefer a trial without medication and this would be reasonable. Certainly now would be the time to do it since she will not be driving in any case. I do not have any clear indication at this time that her rheumatological disease is playing a role in her seizure.   Recommendations: 1) EEG, if positive, would insist on medications.  2) If negative then per patient's wishes would hold AEDs, though if second seizure occurs would start keppra 500mg  BID.  3) Patient is unable to drive, operate heavy machinery, perform activities at heights or participate in water activities until release by outpatient physician. This was discussed with the patient who expressed understanding.   , MD [facility] [phone]  If 7pm- 7am, please page neurology on call as listed in [facility].

## 2014-10-29 NOTE — Progress Notes (Signed)
Patient Heart rate sustained in the 150's, paged on call MD. IV Metoprolol 5mg  and 25 mg PO ordered.  Heart returned to the 80's-90's after 1mg  of IV Metoprolol, Patient refused 25 mg PO Metoprolol,  Charge Nurse and On call MD informed.

## 2014-10-29 NOTE — Consult Note (Signed)
ELECTROPHYSIOLOGY CONSULT NOTE    Patient ID: Mercedes [name] MRN: [id number], DOB/AGE: [birth year] 59 y.o.  Admit date: 10/27/2014 Date of Consult: 10/29/2014  Primary Physician: Rene Paci, MD Primary Cardiologist: Jens Som  Reason for Consultation: atrial fibrillation  HPI:  Mercedes [name] is a 59 y.o. female with a past medical history significant for CAD (s/p DES to CFX 2014, no progression of CAD this admission), lupus, PAD (s/p toe amputation due to osteomyelitis and gangrene and left fem-pop), hypertension, rheumatoid arthritis, prior Takotsubo cardiomyopathy in 2011.  She presented to the office 10/27/14 with chest pain and fatigue.  She was found to be in a 2:1 flutter with a ventricular rate of 150bpm and was referred to the ER for further evaluation.  She was given adenosine which by report terminated the tachycardia.   Catheterization this admission demonstrated widely patent left circ stent with no new obstructive CAD, EF 55%.  Echo pending this admission.   She has had low grade fevers this admission and blood cultures/UA are pending. She endorses chills and dysuria.    Prior to admission, she did not have palpitations or any awareness of tachycardia.  She states she feels "better" in SR and that her chest pressure has resolved. She still reports fatigue. She had some orthostatic dizziness this morning while in SR, but has not had symptoms associated with post-termination pause.  She did have a partial seizure this admission for which code stroke was called.  Telemetry at the time demonstrated SR.  Notably prior to admission, she has been off of many of her medications for >1 year due to financial reasons.  She states that she has financial assistance now in place and thinks she will be able to afford medications.    EP has been asked to evaluate for treatment options.    Past Medical History  Diagnosis Date  . Takotsubo cardiomyopathy 12.2010    f/u echo 09/2009:  EF 50-55%, mild LVH, mod diast dysfxn, mild apical HK  . PAD (peripheral artery disease)     s/p L fem pop 11/2013 in setting of 1st toe osteo/gangrene  . GERD (gastroesophageal reflux disease)   . Dizziness     ? CVA 01/2010 - carotid dopplers with no ICA stenosis  . Arnold-Chiari malformation, type I     MRI brain 02/2010  . Bradycardia   . Tobacco abuse     quit 04/2013  . DEPRESSION   . HYPERLIPIDEMIA   . HYPERTENSION   . CAD (coronary artery disease)     a. NSTEMI 07/2009: LHC - D2 40%, LAD irreg., EF 50% with apical AK (tako-tsubo CM);  b. Inf STEMI (04/2013): Tx Promus DES to CFX;  c. 08/2013 Lexi CL: No ischemia, dist ant/ap/inf-ap infarct, EF   . SLE (systemic lupus erythematosus) dx 05/2013    follows with rheum Dareen Piano)  . Sjogren's syndrome 06/02/13    pt denies this (12/01/13)  . Abnormal LFTs     08/2013 Lipitor 40 d/c'd and crestor 10 started   . Anxiety   . RA (rheumatoid arthritis)     prior tx by Dr. Dareen Piano  . Anemia     as a young woman  . Myocardial infarction      Surgical History:  Past Surgical History  Procedure Laterality Date  . Coronary angioplasty  04/2013  . Tubal ligation    . Colonoscopy    . Femoral-popliteal bypass graft Left 12/02/2013    Procedure:  LEFT FEMORAL-BELOW KNEE POPLITEAL  ARTERY BYPASS GRAFT;  Surgeon: Sherren Kerns, MD;  Location: [facility] OR;  Service: Vascular;  Laterality: Left;  . Amputation Left 12/02/2013    Procedure: LEFT 1ST TOE AMPUTATION;  Surgeon: Sherren Kerns, MD;  Location: [facility] OR;  Service: Vascular;  Laterality: Left;  . Left heart catheterization with coronary angiogram N/A 05/19/2013    Procedure: LEFT HEART CATHETERIZATION WITH CORONARY ANGIOGRAM;  Surgeon: Micheline Chapman, MD;  Location: [facility] CATH LAB;  Service: Cardiovascular;  Laterality: N/A;  . Percutaneous coronary stent intervention (pci-s)  05/19/2013    Procedure: PERCUTANEOUS CORONARY STENT INTERVENTION (PCI-S);  Surgeon: Micheline Chapman, MD;  Location: [facility] CATH  LAB;  Service: Cardiovascular;;  . Abdominal aortagram N/A 11/24/2013    Procedure: ABDOMINAL AORTAGRAM WITH BILATERAL RUNOFF WITH POSSIBLE INTERVENTION;  Surgeon: Chuck Hint, MD;  Location: [facility] CATH LAB;  Service: Cardiovascular;  Laterality: N/A;  . Left heart catheterization with coronary angiogram N/A 10/28/2014    Procedure: LEFT HEART CATHETERIZATION WITH CORONARY ANGIOGRAM;  Surgeon: Iran Ouch, MD;  Location: [facility] CATH LAB;  Service: Cardiovascular;  Laterality: N/A;     Prescriptions prior to admission  Medication Sig Dispense Refill Last Dose  . aspirin 81 MG tablet Take 1 tablet (81 mg total) by mouth daily. 30 tablet  10/26/2014 at Unknown time  . lisinopril (PRINIVIL,ZESTRIL) 20 MG tablet TAKE 1 TABLET BY MOUTH DAILY 30 tablet 5 Past Month  . nitroGLYCERIN (NITROSTAT) 0.4 MG SL tablet Place 0.4 mg under the tongue every 5 (five) minutes as needed for chest pain.   Not Taking at Unknown time    Inpatient Medications:  . apixaban  5 mg Oral BID  . aspirin EC  81 mg Oral Daily  . feeding supplement (ENSURE COMPLETE)  237 mL Oral BID BM  . metoprolol tartrate  25 mg Oral BID  . pantoprazole  40 mg Oral Q0600  . rosuvastatin  10 mg Oral Daily  . sodium chloride  3 mL Intravenous Q12H    Allergies:  Allergies  Allergen Reactions  . Brilinta [Ticagrelor] Shortness Of Breath    Also chest tightness    History   Social History  . Marital Status: Single    Spouse Name: N/A  . Number of Children: 3  . Years of Education: N/A   Occupational History  . Not on file.   Social History Main Topics  . Smoking status: Former Smoker    Types: Cigarettes    Quit date: 05/19/2013  . Smokeless tobacco: Never Used     Comment:    . Alcohol Use: 0.0 oz/week    0 Standard drinks or equivalent per week  . Drug Use: No  . Sexual Activity: Not on file   Other Topics Concern  . Not on file   Social History Narrative   Lives alone.     Family History  Problem  Relation Age of Onset  . Arthritis Mother   . Emphysema Mother   . Arthritis Father   . COPD Father   . Diabetes Other      Review of Systems: General: + chills, fever, no night sweats or weight changes  Cardiovascular:  chest pain resolved, no dyspnea on exertion, edema, orthopnea, palpitations, paroxysmal nocturnal dyspnea Dermatological: No rash, lesions or masses Respiratory: No cough, dyspnea Urologic: No hematuria, + dysuria Abdominal: No nausea, vomiting, diarrhea, bright red blood per rectum, melena, or hematemesis All other systems reviewed and are otherwise negative except as noted above.  Physical Exam: Filed Vitals:   10/29/14 0003 10/29/14 0400 10/29/14 0545 10/29/14 0825  BP: 123/72 106/57 123/77 116/66  Pulse:  59    Temp:  100.9 F (38.3 C)  100.8 F (38.2 C)  TempSrc:  Oral  Oral  Resp: 21 23 21 16   Height:  5\' 6"  (1.676 m)    Weight:  111 lb 11.2 oz (50.667 kg)    SpO2:  93%      GEN- The patient is well appearing, alert and oriented x 3 today.   HEENT: normocephalic, atraumatic; sclera clear, conjunctiva pink; hearing intact; oropharynx clear; neck supple, no JVP Lymph- no cervical lymphadenopathy Lungs- Clear to ausculation bilaterally, normal work of breathing.  No wheezes, rales, rhonchi Heart- Regular rate and rhythm, no murmurs, rubs or gallops  GI- soft, non-tender, non-distended, bowel sounds present, no hepatosplenomegaly Extremities- no clubbing, cyanosis, or edema; DP/PT/radial pulses 2+ bilaterally, left great first toe amputated MS- no significant deformity or atrophy Skin- warm and dry, no rash or lesion Psych- euthymic mood, full affect Neuro- strength and sensation are intact  Labs:   Lab Results  Component Value Date   WBC 7.5 10/29/2014   HGB 12.4 10/29/2014   HCT 38.0 10/29/2014   MCV 75.5* 10/29/2014   PLT 382 10/29/2014     Recent Labs Lab 10/27/14 1108 10/28/14 0351  NA 134* 136  K 4.0 3.8  CL 101 108  CO2 26 22    BUN 10 9  CREATININE 1.18* 0.77  CALCIUM 9.5 8.5  PROT 9.3*  --   BILITOT 0.9  --   ALKPHOS 89  --   ALT 13  --   AST 24  --   GLUCOSE 144* 108*      Radiology/Studies: Ct Head Wo Contrast 10/28/2014   CLINICAL DATA:  A phasic stroke. Aphasia and seizure-like activity after cardiac catheterization.  EXAM: CT HEAD WITHOUT CONTRAST  TECHNIQUE: Contiguous axial images were obtained from the base of the skull through the vertex without intravenous contrast.  COMPARISON:  MRI brain 03/17/2010. CT head without contrast 02/18/2010.  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 The basal ganglia are intact. The insular ribbon is intact bilaterally. No acute cortical infarct is present. Other subcortical white matter changes are stable.  The ventricles are of normal size. No significant extraaxial fluid collection is present.  The paranasal sinuses and mastoid air cells are clear. The calvarium is intact.  IMPRESSION: 1. 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 2. Scattered subcortical white matter changes bilaterally likely reflect the sequela of chronic microvascular ischemia. 3. No acute intracranial abnormality.   Electronically Signed   By: 03/19/2010 M.D.   On: 10/28/2014 14:33   Mr Brain Wo Contrast 10/28/2014   CLINICAL DATA:  A aphasia with left arm and leg weakness following heart catheterization today. Seizure.  EXAM: MRI HEAD WITHOUT CONTRAST  TECHNIQUE: Multiplanar, multiecho pulse sequences of the brain and surrounding structures were obtained without intravenous contrast.  COMPARISON:  CT head without contrast from the same day. MRI brain 03/17/2010  FINDINGS: The diffusion-weighted images demonstrate no evidence for acute or subacute infarction. No hemorrhage or mass lesion is present. Mild periventricular and subcortical T2 hyperintensities bilaterally are greater than expected for age. These have increased  slightly since prior study.  The ventricles are of normal size. No significant extraaxial fluid collection is present.  Flow is present in the major intracranial  arteries. Globes and orbits are intact. The paranasal sinuses are clear. There is some fluid in the mastoid air cells bilaterally. No obstructing nasopharyngeal lesion is evident. Midline structures are within normal limits bilaterally.  Dedicated imaging through the temporal lobes bilaterally demonstrate symmetric size and signal of the hippocampal structures.  IMPRESSION: 1. No acute intracranial abnormality or focal lesion to explain the patient's symptoms. 2. Normal appearance of the temporal lobes. 3. Scattered periventricular and subcortical T2 hyperintensities have progressed some since the prior exam. These likely reflect the sequela of chronic microvascular ischemia. 4. Bilateral mastoid effusions. No obstructing nasopharyngeal lesion is present.   Electronically Signed   By: Marin Roberts M.D.   On: 10/28/2014 20:28    EKG: initially - 2:1 atrial flutter, ventricular rate 154 Post conversion - Sinus rhythm, rate 64, IRBBB, LAD  TELEMETRY: predominately SR, intermittent AFlutter with RVR -> AF -> and post termination pauses  A/P: 1.  Atrial fibrillation/atrial flutter Newly diagnosed this admission.  Rhythm initially begins as atrial flutter then degenerates into AF with post termination pauses. Echo pending Post termination pauses of up to 6 seconds that have been asymptomatic  Continue Eliquis 5mg  bid for CHA2DS2-VASc Score and unadjusted Ischemic Stroke Rate (% per year) is equal to 3.2 % stroke rate/year from a score of 3 Above score calculated as 1 point each if present [CHF, HTN, DM, Vascular=MI/PAD/Aortic Plaque, Age if 65-74, or Female] Above score calculated as 2 points each if present [Age > 75, or Stroke/TIA/TE]  With current low grade fever and history of osteomyelitis requiring toe amputation, she is at  increased risk for infection.    2. CAD/Elevated troponin on admission No progression of CAD on cath this admission.  Elevated troponin likely due to demand ischemia No current ischemic symptoms Continue ASA 81mg  daily as per Dr Kirke Corin  3.  HTN Stable No change required today  4.  Partial seizure Per neurology   Signed, [name], NP 10/29/2014 11:14 AM  I have seen, examined the patient, and reviewed the above assessment and plan.  Changes to above are made where necessary.  The patient has both afib and atrial flutter demonstrated.  She is minimally symptomatic with her atrial arrhythmias but does have occasional palpitations.  She has RVR at times.  In addition, she has been demonstrated to have post termination pauses of 6 seconds.  I would therefore recommend pacemaker implantation with subsequent initiation of diltiazem for rate control.  She is appropriately anticoagulated at this time.  I think that she would actually be a good candidate for leadless pacing.  I will have our research team evaluate her in the am.  She has had low grade fevers here of unclear etiology.  These will need to be fully evaluated before we can proceed with PPM implant.  I would therefore anticipate that she would not be a candidate for PPM until early next week.  Co Sign: Hillis Range, MD 10/29/2014 8:59 PM

## 2014-10-29 NOTE — Progress Notes (Signed)
Paged Hager PA to let him know that pt experienced a second pause of 6.44s. Awaiting call back.

## 2014-10-30 ENCOUNTER — Inpatient Hospital Stay (HOSPITAL_COMMUNITY): Payer: Medicare Other

## 2014-10-30 ENCOUNTER — Encounter (HOSPITAL_COMMUNITY)
Admission: EM | Disposition: A | Discharge status: Home / Self Care | Payer: Self-pay | Source: Home / Self Care | Attending: Cardiology

## 2014-10-30 DIAGNOSIS — R569 Unspecified convulsions: Secondary | ICD-10-CM

## 2014-10-30 DIAGNOSIS — R509 Fever, unspecified: Secondary | ICD-10-CM

## 2014-10-30 LAB — CBC
HEMATOCRIT: 36.5 % (ref 36.0–46.0)
Hemoglobin: 11.9 g/dL — ABNORMAL LOW (ref 12.0–15.0)
MCH: 24.5 pg — ABNORMAL LOW (ref 26.0–34.0)
MCHC: 32.6 g/dL (ref 30.0–36.0)
MCV: 75.1 fL — AB (ref 78.0–100.0)
Platelets: 352 10*3/uL (ref 150–400)
RBC: 4.86 MIL/uL (ref 3.87–5.11)
RDW: 15.3 % (ref 11.5–15.5)
WBC: 7.5 10*3/uL (ref 4.0–10.5)

## 2014-10-30 SURGERY — ATRIAL FLUTTER ABLATION
Anesthesia: Monitor Anesthesia Care

## 2014-10-30 NOTE — Progress Notes (Signed)
Subjective: No further seizures  Exam: Filed Vitals:   10/30/14 1005  BP: 109/66  Pulse:   Temp:   Resp: 20   Gen: In bed, NAD MS: Awake, alert, oriented and appropriate.  PI:[email or web address], EOMI Motor: MAEW Sensory:intact to LT  EEG: This is a normal EEG recording during wakefulness and during sleep.   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 Discussion has been made that she was at significant risk for recurrent seizure(likley 60 - 70%) if her EEG is normal. In this setting she was offered AED medications, however she would prefer a trial without medication and this would be reasonable.  Recommendations: 1) At this time due to patients wishes and negative EEG will not start AED. Discussion was again made that if second seizure occurs she will need to be placed on AED.  2) Patient is unable to drive, operate heavy machinery, perform activities at heights or participate in water activities until release by outpatient physician. This was discussed with the patient who expressed understanding.   Felicie Morn PA-C [facility] [phone]  10/30/2014, 10:20 AM   I have seen and evaluated the patient. I have reviewed the above note and made appropriate changes. Agree with the above. Neurology to sign off.   Ritta Slot, MD [facility] [phone]  If 7pm- 7am, please page neurology on call as listed in AMION.

## 2014-10-30 NOTE — Progress Notes (Addendum)
ELECTROPHYSIOLOGY ROUNDING NOTE    SUBJECTIVE: The patient is doing well today.  At this time, she denies chest pain, shortness of breath.  Fevers and chills last night with non-productive cough.   CURRENT MEDICATIONS: . apixaban  5 mg Oral BID  . aspirin EC  81 mg Oral Daily  . feeding supplement (ENSURE COMPLETE)  237 mL Oral BID BM  . metoprolol tartrate  25 mg Oral BID  . pantoprazole  40 mg Oral Q0600  . rosuvastatin  10 mg Oral Daily  . sodium chloride  3 mL Intravenous Q12H      OBJECTIVE: Telemetry reveals sinus rhythm, yesterday, atrial flutter -> AFib -> 6 second post termination pauses  Physical Exam: Filed Vitals:   10/29/14 2023 10/29/14 2130 10/30/14 0000 10/30/14 0400  BP: 107/60  106/59 113/64  Pulse: 80  64 58  Temp: 101.3 F (38.5 C) 100.1 F (37.8 C) 98.7 F (37.1 C) 100.3 F (37.9 C)  TempSrc: Oral  Oral Oral  Resp: 18     Height:    5\' 6"  (1.676 m)  Weight:    113 lb 6.4 oz (51.438 kg)  SpO2: 94%  94% 96%    Intake/Output Summary (Last 24 hours) at 10/30/14 12/30/14 Last data filed at 10/29/14 2000  Gross per 24 hour  Intake    240 ml  Output    300 ml  Net    -60 ml    GEN- The patient is well appearing, alert and oriented x 3 today.  HEENT: normocephalic, atraumatic; sclera clear, conjunctiva pink; hearing intact; oropharynx clear; neck supple, no JVP Lymph- no cervical lymphadenopathy Lungs- Clear to ausculation bilaterally, normal work of breathing. No wheezes, rales, rhonchi Heart- Regular rate and rhythm, no murmurs, rubs or gallops  GI- soft, non-tender, non-distended, bowel sounds present, no hepatosplenomegaly Extremities- no clubbing, cyanosis, or edema; DP/PT/radial pulses 2+ bilaterally, left great first toe amputated MS- no significant deformity or atrophy Skin- warm and dry, no rash or lesion Psych- euthymic mood, full affect Neuro- strength and sensation are intact   LABS: Basic Metabolic Panel:  Recent Labs  12/29/14 1603 10/28/14 0351 10/29/14 1105  NA  --  136 133*  K  --  3.8 3.9  CL  --  108 100  CO2  --  22 25  GLUCOSE  --  108* 180*  BUN  --  9 7  CREATININE  --  0.77 1.03  CALCIUM  --  8.5 8.6  MG 1.8  --   --    Liver Function Tests:  Recent Labs  10/27/14 1108  AST 24  ALT 13  ALKPHOS 89  BILITOT 0.9  PROT 9.3*  ALBUMIN 3.5   CBC:  Recent Labs  10/27/14 1108  10/29/14 0450 10/30/14 0325  WBC 8.0  < > 7.5 7.5  NEUTROABS 5.0  --   --   --   HGB 16.4*  < > 12.4 11.9*  HCT 48.4*  < > 38.0 36.5  MCV 75.6*  < > 75.5* 75.1*  PLT 396  < > 382 352  < > = values in this interval not displayed. Cardiac Enzymes:  Recent Labs  10/27/14 1108 10/27/14 1603 10/27/14 2159  TROPONINI 0.04* 0.05* <0.03   Fasting Lipid Panel:  Recent Labs  10/28/14 0351  CHOL 132  HDL 38*  LDLCALC 79  TRIG 74  CHOLHDL 3.5   Thyroid Function Tests:  Recent Labs  10/29/14 0450  TSH 0.571  RADIOLOGY: Ct Head Wo Contrast 10/28/2014   CLINICAL DATA:  A phasic stroke. Aphasia and seizure-like activity after cardiac catheterization.  EXAM: CT HEAD WITHOUT CONTRAST  TECHNIQUE: Contiguous axial images were obtained from the base of the skull through the vertex without intravenous contrast.  COMPARISON:  MRI brain 03/17/2010. CT head without contrast 02/18/2010.  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 The basal ganglia are intact. The insular ribbon is intact bilaterally. No acute cortical infarct is present. Other subcortical white matter changes are stable.  The ventricles are of normal size. No significant extraaxial fluid collection is present.  The paranasal sinuses and mastoid air cells are clear. The calvarium is intact.  IMPRESSION: 1. 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 2. Scattered subcortical white matter changes bilaterally likely reflect the sequela of chronic microvascular ischemia. 3.  No acute intracranial abnormality.   Electronically Signed   By: Marin Roberts M.D.   On: 10/28/2014 14:33   Mr Brain Wo Contrast 10/28/2014   CLINICAL DATA:  A aphasia with left arm and leg weakness following heart catheterization today. Seizure.  EXAM: MRI HEAD WITHOUT CONTRAST  TECHNIQUE: Multiplanar, multiecho pulse sequences of the brain and surrounding structures were obtained without intravenous contrast.  COMPARISON:  CT head without contrast from the same day. MRI brain 03/17/2010  FINDINGS: The diffusion-weighted images demonstrate no evidence for acute or subacute infarction. No hemorrhage or mass lesion is present. Mild periventricular and subcortical T2 hyperintensities bilaterally are greater than expected for age. These have increased slightly since prior study.  The ventricles are of normal size. No significant extraaxial fluid collection is present.  Flow is present in the major intracranial arteries. Globes and orbits are intact. The paranasal sinuses are clear. There is some fluid in the mastoid air cells bilaterally. No obstructing nasopharyngeal lesion is evident. Midline structures are within normal limits bilaterally.  Dedicated imaging through the temporal lobes bilaterally demonstrate symmetric size and signal of the hippocampal structures.  IMPRESSION: 1. No acute intracranial abnormality or focal lesion to explain the patient's symptoms. 2. Normal appearance of the temporal lobes. 3. Scattered periventricular and subcortical T2 hyperintensities have progressed some since the prior exam. These likely reflect the sequela of chronic microvascular ischemia. 4. Bilateral mastoid effusions. No obstructing nasopharyngeal lesion is present.   Electronically Signed   By: Marin Roberts M.D.   On: 10/28/2014 20:28    ASSESSMENT AND PLAN:  1.  Atrial fibrillation/atrial flutter Newly diagnosed this admission with >6 second post termination pauses Continue Eliquis, CHADS2VASC is  3 PPM implantation discussed with patient yesterday, to be screened for Leadless pacemaker study today by research [facility] in last 24 hours 101.3 - blood cultures pending, pt with non-productive cough, will check CXR  2. CAD/Elevated troponin on admission No progression of CAD on cath this admission. Elevated troponin likely due to demand ischemia No current ischemic symptoms Continue ASA 81mg  daily as per Dr  3. HTN Stable No change required today  4. Partial seizure Per neurology  Kirke Corin, NP 10/30/2014 6:39 AM  I have seen, examined the patient, and reviewed the above assessment and plan.  Changes to above are made where necessary.  Will anticipate pacing next week (to be screened today for Leadless pacing trial).  IF she is a candidate for leadless would probably do on Tues.  If not, could have traditional pacemaker on Monday.  WIll need fevers resolved first.  WIll ask medicine to see.  Check for influenza, cxr.  Dr Graciela Husbands to follow over the weekend.  Co Sign: Hillis Range, MD 10/30/2014 8:07 AM

## 2014-10-31 LAB — CBC
HCT: 37 % (ref 36.0–46.0)
Hemoglobin: 12.1 g/dL (ref 12.0–15.0)
MCH: 24.6 pg — AB (ref 26.0–34.0)
MCHC: 32.7 g/dL (ref 30.0–36.0)
MCV: 75.2 fL — ABNORMAL LOW (ref 78.0–100.0)
Platelets: 395 10*3/uL (ref 150–400)
RBC: 4.92 MIL/uL (ref 3.87–5.11)
RDW: 15.3 % (ref 11.5–15.5)
WBC: 8 10*3/uL (ref 4.0–10.5)

## 2014-10-31 NOTE — Progress Notes (Signed)
Twice during the night, pt required a new gown and a complete bed change due to being soaked through with sweat. This is a new symptom for pt. Temperature WNL. Pt is otherwise resting comfortably. Will continue to monitor pt.

## 2014-10-31 NOTE — Progress Notes (Signed)
Subjective: Weak with some SOB; no productive cough, dysuria, diarrhea, abdominal pain  Objective: Vital signs in last 24 hours: Temp:  [97.8 F (36.6 C)-100.9 F (38.3 C)] 97.9 F (36.6 C) (03/05 0600) Pulse Rate:  [64-77] 68 (03/05 0000) Resp:  [16-20] 19 (03/05 0600) BP: (101-127)/(57-77) 125/71 mmHg (03/05 0600) SpO2:  [93 %-97 %] 97 % (03/05 0600) Weight:  [115 lb 3.2 oz (52.254 kg)] 115 lb 3.2 oz (52.254 kg) (03/05 0600) Last BM Date: 10/29/14  Intake/Output from previous day: 03/04 0701 - 03/05 0700 In: 480 [P.O.:480] Out: -  Intake/Output this shift:    Medications Current Facility-Administered Medications  Medication Dose Route Frequency Provider Last Rate Last Dose  . 0.9 %  sodium chloride infusion  250 mL Intravenous PRN Abelino Derrick, PA-C      . acetaminophen (TYLENOL) tablet 650 mg  650 mg Oral Q4H PRN Abelino Derrick, PA-C   650 mg at 10/31/14 0140  . ALPRAZolam Prudy Feeler) tablet 0.25 mg  0.25 mg Oral BID PRN Abelino Derrick, PA-C      . apixaban Everlene Balls) tablet 5 mg  5 mg Oral BID Dwana Melena, PA-C   5 mg at 10/30/14 2212  . aspirin EC tablet 81 mg  81 mg Oral Daily Abelino Derrick, PA-C   81 mg at 10/30/14 1005  . feeding supplement (ENSURE COMPLETE) (ENSURE COMPLETE) liquid 237 mL  237 mL Oral BID BM Lewayne Bunting, MD   237 mL at 10/30/14 1000  . metoprolol (LOPRESSOR) injection 2.5 mg  2.5 mg Intravenous Q6H PRN Dwana Melena, PA-C   2.5 mg at 10/29/14 1218  . metoprolol tartrate (LOPRESSOR) tablet 25 mg  25 mg Oral BID Abelino Derrick, PA-C   25 mg at 10/30/14 2211  . nitroGLYCERIN (NITROSTAT) SL tablet 0.4 mg  0.4 mg Sublingual Q5 Min x 3 PRN Abelino Derrick, PA-C   0.4 mg at 10/28/14 2255  . ondansetron (ZOFRAN) injection 4 mg  4 mg Intravenous Q6H PRN Abelino Derrick, PA-C   4 mg at 10/28/14 2314  . pantoprazole (PROTONIX) EC tablet 40 mg  40 mg Oral Q0600 Abelino Derrick, PA-C   40 mg at 10/31/14 2671  . rosuvastatin (CRESTOR) tablet 10 mg  10 mg Oral Daily  Abelino Derrick, PA-C   10 mg at 10/30/14 1005  . sodium chloride 0.9 % injection 3 mL  3 mL Intravenous Q12H Eda Paschal Kilroy, PA-C   3 mL at 10/30/14 2200  . sodium chloride 0.9 % injection 3 mL  3 mL Intravenous PRN Abelino Derrick, PA-C      . zolpidem (AMBIEN) tablet 5 mg  5 mg Oral QHS PRN Abelino Derrick, PA-C        PE: General appearance: WD/WN, NAD HEENT: normal Neck: supple Lungs: clear to auscultation bilaterally Heart: RRR Abd: soft Extremities: No LEE Skin: Warm and dry Neurologic: Grossly normal  Lab Results:   Recent Labs  10/29/14 0450 10/30/14 0325 10/31/14 0219  WBC 7.5 7.5 8.0  HGB 12.4 11.9* 12.1  HCT 38.0 36.5 37.0  PLT 382 352 395   BMET  Recent Labs  10/29/14 1105  NA 133*  K 3.9  CL 100  CO2 25  GLUCOSE 180*  BUN 7  CREATININE 1.03  CALCIUM 8.6   PT/INR  Recent Labs  10/28/14 1417  LABPROT 14.4  INR 1.10   Lipid Panel     Component Value  Date/Time   CHOL 132 10/28/2014 0351   TRIG 74 10/28/2014 0351   HDL 38* 10/28/2014 0351   CHOLHDL 3.5 10/28/2014 0351   VLDL 15 10/28/2014 0351   LDLCALC 79 10/28/2014 0351      Assessment/Plan    Unstable angina SP LHC revealing widely patent ostial left circumflex stent. However, the stent extends into the left main and reveals the LAD with 30% ostial LAD stenosis. This does not appear to be flow-limiting. No evidence of obstructive coronary artery disease.  Normal LV systolic function with inferior apical hypokinesis consistent with prior myocardial infarction. Ejection fraction is 55%. Normal LV end-diastolic pressure.     ASA, lopressor 25 bid, crestor     CAD - S/P Takotsubo MI in 2000, CFX DES Sept 2014    Partial seizure. No acute abnormality seen on MRI.  Chronic microvascular ischemia.  CT Head-Stable remote white matter infarct just above the right lentiform nucleus. She declines antisz med; FU neurology following DC; no driving until then.     Atrial flutter with rapid  ventricular response Continue  Eliquis 5mg  BID.      Post termination pauses-for pacemaker when afebrile   Febrile  Low grade temp-Cultures negative; UA negative; chest xray negative; WBC normal    Dyslipidemia  Crestor     TOBACCO ABUSE    Essential hypertension     PVD- s/p Lt [facility] April 2015   SLE (systemic lupus erythematosus)   LOS: 4 days  May 2015

## 2014-10-31 NOTE — Progress Notes (Addendum)
HR continues to go in/out Afib, frequent pauses, back to NSR for only a few seconds.  HR up to 150's non sustaing.  Pt states she is weak and is getting very anxious about her situation; she also feels fluttering in her chest at times.  Mercedes Boozer, NP notified. Temp has spiked up to 102.2.  Blood cultures ordered again.  Will monitor closely.

## 2014-11-01 DIAGNOSIS — R5081 Fever presenting with conditions classified elsewhere: Secondary | ICD-10-CM

## 2014-11-01 LAB — CBC
HCT: 37.9 % (ref 36.0–46.0)
HEMOGLOBIN: 12.2 g/dL (ref 12.0–15.0)
MCH: 24.4 pg — AB (ref 26.0–34.0)
MCHC: 32.2 g/dL (ref 30.0–36.0)
MCV: 75.6 fL — ABNORMAL LOW (ref 78.0–100.0)
Platelets: 452 10*3/uL — ABNORMAL HIGH (ref 150–400)
RBC: 5.01 MIL/uL (ref 3.87–5.11)
RDW: 15.3 % (ref 11.5–15.5)
WBC: 6.7 10*3/uL (ref 4.0–10.5)

## 2014-11-01 NOTE — Consult Note (Signed)
[facility]     Reason for Consult:fever    Referring Physician: Dr. Jens Som  Principal Problem:   Unstable angina-jaw pain Active Problems:   Dyslipidemia   TOBACCO ABUSE   Essential hypertension   PVD- s/p Lt FP April 2015   SLE (systemic lupus erythematosus)   Abnormal LFTs on Lipitor- changed to Crestor but pt not compliant   CAD - S/P Takotsubo MI in 2000, CFX DES Sept 2014   Atrial flutter with rapid ventricular response vs. SVT   Noncompliance with medication regimen   Seizure   . apixaban  5 mg Oral BID  . aspirin EC  81 mg Oral Daily  . feeding supplement (ENSURE COMPLETE)  237 mL Oral BID BM  . pantoprazole  40 mg Oral Q0600  . rosuvastatin  10 mg Oral Daily  . sodium chloride  3 mL Intravenous Q12H    Recommendations: Await cultures   Assessment: She has fever but otherwise asymptomatic.  Fever on 3/3 and again yesterday.  Some dry cough reported on 3/3 but denies it now. May be non infectious related to recent focal seizure.     Antibiotics: none  HPI: Mercedes [name] is a 59 y.o. female with chest pain and came in to ED 3/2.  Also with aflutter.   CArdiac cath 3/2. Had an acute neurologic episode that  Seems to have been a focal seizure.  EEG negative.  WBC wnl.  No chills, no dysuria, no diarrhea.  No recent sick contacts.     Review of Systems: A comprehensive review of systems was negative.  Past Medical History  Diagnosis Date  . 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 PAD (peripheral artery disease)     s/p L fem pop 11/2013 in setting of 1st toe osteo/gangrene  . GERD (gastroesophageal reflux disease)   . Dizziness     ? CVA 01/2010 - carotid dopplers with no ICA stenosis  . Arnold-Chiari malformation, type I     MRI brain 02/2010  . Bradycardia   . Tobacco abuse     quit 04/2013  . DEPRESSION   . HYPERLIPIDEMIA   . HYPERTENSION   . CAD  (coronary artery disease)     a. NSTEMI 07/2009: LHC - D2 40%, LAD irreg., EF 50% with apical AK (tako-tsubo CM);  b. Inf STEMI (04/2013): Tx Promus DES to CFX;  c. 08/2013 Lexi CL: No ischemia, dist ant/ap/inf-ap infarct, EF   . SLE (systemic lupus erythematosus) dx 05/2013    follows with rheum Dareen Piano)  . Sjogren's syndrome 06/02/13    pt denies this (12/01/13)  . Abnormal LFTs     08/2013 Lipitor 40 d/c'd and crestor 10 started   . Anxiety   . RA (rheumatoid arthritis)     prior tx by Dr. Dareen Piano  . Anemia     as a young woman  . Myocardial infarction     History  Substance Use Topics  . Smoking status: Former Smoker    Types: Cigarettes    Quit date: 05/19/2013  . Smokeless tobacco: Never Used     Comment:    . Alcohol Use: 0.0 oz/week    0 Standard drinks or equivalent per week    Family History  Problem Relation Age of Onset  . Arthritis Mother   . Emphysema Mother   . Arthritis Father   . COPD  Father   . Diabetes Other    Allergies  Allergen Reactions  . Brilinta [Ticagrelor] Shortness Of Breath    Also chest tightness    OBJECTIVE: Blood pressure 107/69, pulse 69, temperature 98.7 F (37.1 C), temperature source Oral, resp. rate 20, height 5\' 6"  (1.676 m), weight 115 lb 3.2 oz (52.254 kg), SpO2 96 %. General: awake, alert, nad, non toxic appearing Skin: no rashes Lungs: CTA B Cor: RRR Abdomen: soft, nt, nd Ext: no edema  Microbiology: Recent Results (from the past 240 hour(s))  Culture, blood (routine x 2)     Status: None (Preliminary result)   Collection Time: 10/29/14 11:05 AM  Result Value Ref Range Status   Specimen Description BLOOD LEFT ARM  Final   Special Requests BOTTLES DRAWN AEROBIC AND ANAEROBIC 10CC  Final   Culture   Final           BLOOD CULTURE RECEIVED NO GROWTH TO DATE CULTURE WILL BE HELD FOR 5 DAYS BEFORE ISSUING A FINAL NEGATIVE REPORT Performed at 12/29/14    Report Status PENDING  Incomplete  Culture, blood  (routine x 2)     Status: None (Preliminary result)   Collection Time: 10/29/14 11:20 AM  Result Value Ref Range Status   Specimen Description BLOOD LEFT ANTECUBITAL  Final   Special Requests BOTTLES DRAWN AEROBIC AND ANAEROBIC 10CC  Final   Culture   Final           BLOOD CULTURE RECEIVED NO GROWTH TO DATE CULTURE WILL BE HELD FOR 5 DAYS BEFORE ISSUING A FINAL NEGATIVE REPORT Performed at 12/29/14    Report Status PENDING  Incomplete  Culture, blood (routine x 2)     Status: None (Preliminary result)   Collection Time: 10/31/14  6:40 PM  Result Value Ref Range Status   Specimen Description BLOOD RIGHT HAND  Final   Special Requests BOTTLES DRAWN AEROBIC AND ANAEROBIC 5CCC  Final   Culture PENDING  Incomplete   Report Status PENDING  Incomplete  Culture, blood (routine x 2)     Status: None (Preliminary result)   Collection Time: 10/31/14  6:50 PM  Result Value Ref Range Status   Specimen Description BLOOD LEFT HAND  Final   Special Requests BOTTLES DRAWN AEROBIC AND ANAEROBIC 5CC  Final   Culture PENDING  Incomplete   Report Status PENDING  Incomplete    COMER, 12/31/14, MD [email or web address] Molly Maduro pager  [phone] cell 11/01/2014, 11:55 AM

## 2014-11-01 NOTE — Progress Notes (Signed)
Subjective: Denies dyspnea or chest pain; no productive cough, dysuria, diarrhea, abdominal pain  Objective: Vital signs in last 24 hours: Temp:  [98.5 F (36.9 C)-102.2 F (39 C)] 98.7 F (37.1 C) (03/06 0818) Pulse Rate:  [69-94] 69 (03/06 0818) Resp:  [16-20] 20 (03/06 0818) BP: (95-118)/(56-85) 107/69 mmHg (03/06 0818) SpO2:  [96 %-99 %] 96 % (03/06 0818) Weight:  [115 lb 3.2 oz (52.254 kg)] 115 lb 3.2 oz (52.254 kg) (03/06 0400) Last BM Date: 10/31/14  Intake/Output from previous day: 03/05 0701 - 03/06 0700 In: 240 [P.O.:240] Out: 425 [Urine:425] Intake/Output this shift:    Medications Current Facility-Administered Medications  Medication Dose Route Frequency Provider Last Rate Last Dose  . 0.9 %  sodium chloride infusion  250 mL Intravenous PRN Abelino Derrick, PA-C      . acetaminophen (TYLENOL) tablet 650 mg  650 mg Oral Q4H PRN Abelino Derrick, PA-C   650 mg at 10/31/14 1647  . ALPRAZolam Prudy Feeler) tablet 0.25 mg  0.25 mg Oral BID PRN Abelino Derrick, PA-C      . apixaban Everlene Balls) tablet 5 mg  5 mg Oral BID Dwana Melena, PA-C   5 mg at 10/31/14 2237  . aspirin EC tablet 81 mg  81 mg Oral Daily Abelino Derrick, PA-C   81 mg at 10/31/14 1152  . feeding supplement (ENSURE COMPLETE) (ENSURE COMPLETE) liquid 237 mL  237 mL Oral BID BM Lewayne Bunting, MD   237 mL at 10/31/14 1453  . metoprolol tartrate (LOPRESSOR) tablet 25 mg  25 mg Oral BID Abelino Derrick, PA-C   25 mg at 10/31/14 1152  . nitroGLYCERIN (NITROSTAT) SL tablet 0.4 mg  0.4 mg Sublingual Q5 Min x 3 PRN Abelino Derrick, PA-C   0.4 mg at 10/28/14 2255  . ondansetron (ZOFRAN) injection 4 mg  4 mg Intravenous Q6H PRN Abelino Derrick, PA-C   4 mg at 10/28/14 2314  . pantoprazole (PROTONIX) EC tablet 40 mg  40 mg Oral Q0600 Abelino Derrick, PA-C   40 mg at 11/01/14 0507  . rosuvastatin (CRESTOR) tablet 10 mg  10 mg Oral Daily Abelino Derrick, PA-C   10 mg at 10/31/14 1152  . sodium chloride 0.9 % injection 3 mL  3 mL  Intravenous Q12H Eda Paschal Kilroy, PA-C   3 mL at 10/31/14 2200  . sodium chloride 0.9 % injection 3 mL  3 mL Intravenous PRN Abelino Derrick, PA-C      . zolpidem (AMBIEN) tablet 5 mg  5 mg Oral QHS PRN Abelino Derrick, PA-C        PE: General appearance: WD/WN, NAD HEENT: normal Neck: supple Lungs: clear to auscultation bilaterally Heart: RRR Abd: soft Extremities: No LEE Skin: Warm and dry Neurologic: Grossly normal  Lab Results:   Recent Labs  10/30/14 0325 10/31/14 0219 11/01/14 0353  WBC 7.5 8.0 6.7  HGB 11.9* 12.1 12.2  HCT 36.5 37.0 37.9  PLT 352 395 452*   BMET  Recent Labs  10/29/14 1105  NA 133*  K 3.9  CL 100  CO2 25  GLUCOSE 180*  BUN 7  CREATININE 1.03  CALCIUM 8.6   Lipid Panel     Component Value Date/Time   CHOL 132 10/28/2014 0351   TRIG 74 10/28/2014 0351   HDL 38* 10/28/2014 0351   CHOLHDL 3.5 10/28/2014 0351   VLDL 15 10/28/2014 0351   LDLCALC 79 10/28/2014 0351  Assessment/Plan    Unstable angina SP LHC revealing widely patent ostial left circumflex stent. However, the stent extends into the left main and reveals the LAD with 30% ostial LAD stenosis. This does not appear to be flow-limiting. No evidence of obstructive coronary artery disease.  Normal LV systolic function with inferior apical hypokinesis consistent with prior myocardial infarction. Ejection fraction is 55%. Normal LV end-diastolic pressure.     ASA, lopressor 25 bid, crestor     Partial seizure. No acute abnormality seen on MRI.  Chronic microvascular ischemia.  CT Head-Stable remote white matter infarct just above the right lentiform nucleus. She declines antisz med; FU neurology following DC; no driving until then.     Atrial flutter with rapid ventricular response Continue  Eliquis 5mg  BID.      Patient continues with tachy-brady with post termination pauses-for pacemaker when afebrile    Febrile  Continues with fever-No localizing signs/symptoms of infection;  WBC normal; Cultures negative to date; UA negative; chest xray negative; Will ask ID to see.    Dyslipidemia  Crestor     TOBACCO ABUSE    Essential hypertension     PVD- s/p Lt [facility] April 2015   SLE (systemic lupus erythematosus)   LOS: 5 days  May 2015

## 2014-11-02 LAB — URINE CULTURE: Colony Count: 35000

## 2014-11-02 MED ORDER — SODIUM CHLORIDE 0.9 % IV SOLN: INTRAVENOUS | Status: DC

## 2014-11-02 MED ORDER — SODIUM CHLORIDE 0.9 % IV SOLN: 250.0000 mL | INTRAVENOUS | Status: DC | PRN

## 2014-11-02 NOTE — Progress Notes (Signed)
Patient Name: Mercedes [name] Date of Encounter: 11/02/2014  Principal Problem:   Unstable angina-jaw pain Active Problems:   Dyslipidemia   TOBACCO ABUSE   Essential hypertension   PVD- s/p Lt FP April 2015   SLE (systemic lupus erythematosus)   Abnormal LFTs on Lipitor- changed to Crestor but pt not compliant   CAD - S/P Takotsubo MI in 2000, CFX DES Sept 2014   Atrial flutter with rapid ventricular response vs. SVT   Noncompliance with medication regimen   Seizure   Primary Cardiologist: Dr. Jens Som Electrophysiologist: Dr. 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 Admitted 03/01 with chest pain, cath w/ no progression of CAD this admission, + sz after cath, has continued to have intermittent fevers. ID and Neuro have seen, cx have been negative so far. Atrial flutter was hard to control, 6 sec post-term pauses, for PPM when cleared.   SUBJECTIVE: No chest pain or SOB  OBJECTIVE Tmax 102.2 at 16:35 on 03/05 Temp (24hrs), Avg:98.1 F (36.7 C), Min:97.6 F (36.4 C), Max:98.4 F (36.9 C)    Intake/Output Summary (Last 24 hours) at 11/02/14 0902 Last data filed at 11/01/14 2206  Gross per 24 hour  Intake      3 ml  Output    200 ml  Net   -197 ml   Filed Weights   10/31/14 0600 11/01/14 0400 11/02/14 0400  Weight: 115 lb 3.2 oz (52.254 kg) 115 lb 3.2 oz (52.254 kg) 114 lb (51.71 kg)    PHYSICAL EXAM General: Well developed, well nourished, female in no acute distress. Head: Normocephalic, atraumatic.  Neck: Supple without bruits, JVD is elevated. Lungs:  Resp regular and unlabored, few rales bases but good air exchange. Heart: RRR, S1, S2, no S3, S4, or murmur; no rub. Abdomen: Soft, non-tender, non-distended, BS + x 4.  Extremities: No clubbing, cyanosis, no edema.  Neuro: Alert and oriented X 3. Moves  all extremities spontaneously. Psych: Normal affect.  LABS: CBC: Recent Labs  10/31/14 0219 11/01/14 0353  WBC 8.0 6.7  HGB 12.1 12.2  HCT 37.0 37.9  MCV 75.2* 75.6*  PLT 395 452*   TELE:  SR, sinus brady, briefly 40s, no long pauses, no HR sustained < 50      Current Medications:  . apixaban  5 mg Oral BID  . aspirin EC  81 mg Oral Daily  . feeding supplement (ENSURE COMPLETE)  237 mL Oral BID BM  . pantoprazole  40 mg Oral Q0600  . rosuvastatin  10 mg Oral Daily  . sodium chloride  3 mL Intravenous Q12H      ASSESSMENT AND PLAN: Principal Problem:   Unstable angina-jaw pain - cath w/ patent CFX stent, no flow-limiting lesions, EF nl w/ apical HK c/w prior MI.  - continue ASA, statin  Active Problems:   Dyslipidemia - on statin    TOBACCO ABUSE - currently not on nicotine supplement    Essential hypertension - SBP 105-136 currently, not on rx    PVD- s/p Lt FP April 2015 - No LE pain    SLE (systemic lupus erythematosus) - stable    Abnormal LFTs on Lipitor- changed to Crestor but pt not compliant - currently tolerating Crestor    CAD - S/P Takotsubo MI in 2000, CFX DES Sept 2014 - Med rx  for CAD w/ ASA, statin, no BB due to pauses    Atrial flutter with rapid ventricular response vs. SVT - not on rate-lowering rx due to pauses    Noncompliance with medication regimen - compliance encouraged    Seizure - per Neuro, no further wkup, they have signed off - no driving till cleared by OP Neuro - if she has another one, will need rx  Signed, Adithi Gammon , PA-C 9:02 AM 11/02/2014  Have reviewed issues with patient and she is agreeable to proceeding with pacer in am and Dr Fawn Kirk will be by to review leadless pacemaker with her in the am

## 2014-11-02 NOTE — Research (Signed)
LEADLESS Informed Consent   Subject Name: Mercedes [name]  Subject met inclusion and exclusion criteria.  The informed consent form, study requirements and expectations were reviewed with the subject and questions and concerns were addressed prior to the signing of the consent form.  The subject verbalized understanding of the trail requirements.  The subject agreed to participate in the LEADLESS trial and signed the informed consent.  The informed consent was obtained prior to performance of any protocol-specific procedures for the subject.  A copy of the signed informed consent was given to the subject and a copy was placed in the subject's medical record.  [name] [name] 11/02/2014, 13:45PM

## 2014-11-02 NOTE — Progress Notes (Signed)
Paged MD on call regarding patients hear rate/rhythm of a fib/flutter in 120's. Patient doesn't want any medications to treat at this time. Would like to try to rest. Will continue to monitor.

## 2014-11-02 NOTE — Progress Notes (Signed)
Spoke with patient regarding pacemaker procedure she is scheduled for in the AM, patient says she has already signed consent with the doctor and that she does not need IV fluids over ngiht. I am unable to locate the signed consent. Paged cardiology on call, spoke with Katie regarding an update on patient situation. No new orders received. Patient is resting comfortably in bed. Will continue to monitor.

## 2014-11-02 NOTE — Progress Notes (Signed)
    [facility]    Date of Admission:  10/27/2014   Total days of antibiotics 0           ID: Mercedes [name] is a 59 y.o. female with  Aflutter to get PM however had 3 days of fever of unknown source, now sponatenously resolved  Principal Problem:   Unstable angina-jaw pain Active Problems:   Dyslipidemia   TOBACCO ABUSE   Essential hypertension   PVD- s/p Lt FP April 2015   SLE (systemic lupus erythematosus)   Abnormal LFTs on Lipitor- changed to Crestor but pt not compliant   CAD - S/P Takotsubo MI in 2000, CFX DES Sept 2014   Atrial flutter with rapid ventricular response vs. SVT   Noncompliance with medication regimen   Seizure    Subjective: afebrile  Medications:  . apixaban  5 mg Oral BID  . aspirin EC  81 mg Oral Daily  . feeding supplement (ENSURE COMPLETE)  237 mL Oral BID BM  . pantoprazole  40 mg Oral Q0600  . rosuvastatin  10 mg Oral Daily  . sodium chloride  3 mL Intravenous Q12H    Objective: Vital signs in last 24 hours: Temp:  [97.6 F (36.4 C)-98.6 F (37 C)] 97.6 F (36.4 C) (03/07 0806) Pulse Rate:  [69-78] 69 (03/07 0806) Resp:  [16-18] 16 (03/07 0806) BP: (112-136)/(48-88) 132/86 mmHg (03/07 0806) SpO2:  [96 %-97 %] 96 % (03/07 0806) Weight:  [114 lb (51.71 kg)] 114 lb (51.71 kg) (03/07 0400)   Did not examine  Lab Results  Recent Labs  10/31/14 0219 11/01/14 0353  WBC 8.0 6.7  HGB 12.1 12.2  HCT 37.0 37.9   Liver Panel No results for input(s): PROT, ALBUMIN, AST, ALT, ALKPHOS, BILITOT, BILIDIR, IBILI in the last 72 hours. Sedimentation Rate No results for input(s): ESRSEDRATE in the last 72 hours. C-Reactive Protein No results for input(s): CRP in the last 72 hours.  Microbiology:  Studies/Results: No results found.   Assessment/Plan: Fever = appears spontaneously resolved. No source found. Will continue to follow up recent blood cx. If blood cx from 3/5 remains afebrile for 72hrs then can  place pacemaker.  [facility]ue [facility] Cell: [phone] Pager: [phone]  11/02/2014, 11:02 AM

## 2014-11-03 ENCOUNTER — Ambulatory Visit: Payer: Medicare Other | Admitting: Podiatry

## 2014-11-03 MED ORDER — SODIUM CHLORIDE 0.45 % IV SOLN
INTRAVENOUS | Status: DC
  Administered 2014-11-03: 10:00:00 via INTRAVENOUS

## 2014-11-03 MED ORDER — CEFAZOLIN SODIUM-DEXTROSE 2-3 GM-% IV SOLR
2.0000 g | INTRAVENOUS | Status: AC
  Filled 2014-11-03: qty 50

## 2014-11-03 MED ORDER — SODIUM CHLORIDE 0.9 % IV SOLN
INTRAVENOUS | Status: DC
  Administered 2014-11-04: 06:00:00 via INTRAVENOUS

## 2014-11-03 NOTE — Progress Notes (Signed)
ELECTROPHYSIOLOGY ROUNDING NOTE    SUBJECTIVE: The patient is doing well today.  At this time, she denies chest pain, shortness of breath. Fevers and chills have resolved.   Blood cultures negative to date.   CURRENT MEDICATIONS: . feeding supplement (ENSURE COMPLETE)  237 mL Oral BID BM  . pantoprazole  40 mg Oral Q0600  . rosuvastatin  10 mg Oral Daily  . sodium chloride  3 mL Intravenous Q12H   . sodium chloride      OBJECTIVE: Telemetry reveals sinus rhythm with intermittent AF w RVR  Physical Exam: Filed Vitals:   11/02/14 1219 11/02/14 1633 11/02/14 2122 11/03/14 0500  BP: 138/70 117/62 168/97 117/87  Pulse: 72 72 105 73  Temp:  98.8 F (37.1 C) 97.4 F (36.3 C) 98.1 F (36.7 C)  TempSrc:  Oral Oral Oral  Resp: 16 16 16    Height:      Weight:    113 lb 12.8 oz (51.619 kg)  SpO2: 96% 97% 98% 98%   No intake or output data in the 24 hours ending 11/03/14 0755  GEN- The patient is well appearing, alert and oriented x 3 today.  HEENT: normocephalic, atraumatic; sclera clear, conjunctiva pink; hearing intact; oropharynx clear; neck supple, no JVP Lymph- no cervical lymphadenopathy Lungs- Clear to ausculation bilaterally, normal work of breathing. No wheezes, rales, rhonchi Heart- Regular rate and rhythm, no murmurs, rubs or gallops  GI- soft, non-tender, non-distended, bowel sounds present, no hepatosplenomegaly Extremities- no clubbing, cyanosis, or edema; DP/PT/radial pulses 2+ bilaterally, left great first toe amputated MS- no significant deformity or atrophy Skin- warm and dry, no rash or lesion Psych- euthymic mood, full affect Neuro- strength and sensation are intact   LABS: CBC:  Recent Labs  11/01/14 0353  WBC 6.7  HGB 12.2  HCT 37.9  MCV 75.6*  PLT 452*    RADIOLOGY: Ct Head Wo Contrast 10/28/2014   CLINICAL DATA:  A phasic stroke. Aphasia and seizure-like activity after cardiac catheterization.  EXAM: CT HEAD WITHOUT CONTRAST   TECHNIQUE: Contiguous axial images were obtained from the base of the skull through the vertex without intravenous contrast.  COMPARISON:  MRI brain 03/17/2010. CT head without contrast 02/18/2010.  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 The basal ganglia are intact. The insular ribbon is intact bilaterally. No acute cortical infarct is present. Other subcortical white matter changes are stable.  The ventricles are of normal size. No significant extraaxial fluid collection is present.  The paranasal sinuses and mastoid air cells are clear. The calvarium is intact.  IMPRESSION: 1. 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 2. Scattered subcortical white matter changes bilaterally likely reflect the sequela of chronic microvascular ischemia. 3. No acute intracranial abnormality.   Electronically Signed   By: 02/20/2010 M.D.   On: 10/28/2014 14:33   Mr Brain Wo Contrast 10/28/2014   CLINICAL DATA:  A aphasia with left arm and leg weakness following heart catheterization today. Seizure.  EXAM: MRI HEAD WITHOUT CONTRAST  TECHNIQUE: Multiplanar, multiecho pulse sequences of the brain and surrounding structures were obtained without intravenous contrast.  COMPARISON:  CT head without contrast from the same day. MRI brain 03/17/2010  FINDINGS: The diffusion-weighted images demonstrate no evidence for acute or subacute infarction. No hemorrhage or mass lesion is present. Mild periventricular and subcortical T2 hyperintensities bilaterally are greater than expected for age. These have increased slightly since prior study.  The ventricles are of normal size. No significant extraaxial fluid collection is present.  Flow is present in the major intracranial arteries. Globes and orbits are intact. The paranasal sinuses are clear. There is some fluid in the mastoid air cells bilaterally. No obstructing nasopharyngeal lesion is evident.  Midline structures are within normal limits bilaterally.  Dedicated imaging through the temporal lobes bilaterally demonstrate symmetric size and signal of the hippocampal structures.  IMPRESSION: 1. No acute intracranial abnormality or focal lesion to explain the patient's symptoms. 2. Normal appearance of the temporal lobes. 3. Scattered periventricular and subcortical T2 hyperintensities have progressed some since the prior exam. These likely reflect the sequela of chronic microvascular ischemia. 4. Bilateral mastoid effusions. No obstructing nasopharyngeal lesion is present.   Electronically Signed   By: Marin Roberts M.D.   On: 10/28/2014 20:28    ASSESSMENT AND PLAN:  1.  Atrial fibrillation/atrial flutter Newly diagnosed this admission with >6 second post termination pauses Continue Eliquis, CHADS2VASC is 3 For Leadless pacemaker implantation today  2. CAD/Elevated troponin on admission No progression of CAD on cath this admission. Elevated troponin likely due to demand ischemia No current ischemic symptoms Continue ASA 81mg  daily as per Dr  3. HTN Stable No change required today  4. Partial seizure Per neurology  Kirke Corin, NP 11/03/2014 7:55 AM   I have seen, examined the patient, and reviewed the above assessment and plan.  Changes to above are made where necessary.  The patient has symptomatic sick sinus syndrome with transient sinus bradycardia and post termination pauses of > 6 seconds.  I would therefore recommend pacemaker implantation at this time.  Risks, benefits, alternatives to both traditional and leadless pacemaker implantation were discussed in detail with the patient today. The patient understands that the risks include but are not limited to bleeding, infection, pneumothorax, perforation, tamponade, vascular damage, renal failure, MI, stroke, death,  and lead dislodgement and wishes to proceed with a leadless pacemaker. We will therefore plan to  proceed later today.   Co Sign: 01/03/2015, MD 11/03/2014 8:06 AM

## 2014-11-04 ENCOUNTER — Encounter (HOSPITAL_COMMUNITY): Payer: Self-pay | Admitting: Internal Medicine

## 2014-11-04 ENCOUNTER — Encounter: Payer: Self-pay | Admitting: Internal Medicine

## 2014-11-04 ENCOUNTER — Encounter (HOSPITAL_COMMUNITY)
Admission: EM | Disposition: A | Discharge status: Home / Self Care | Payer: Self-pay | Source: Home / Self Care | Attending: Cardiology

## 2014-11-04 DIAGNOSIS — I495 Sick sinus syndrome: Secondary | ICD-10-CM

## 2014-11-04 DIAGNOSIS — I4891 Unspecified atrial fibrillation: Secondary | ICD-10-CM

## 2014-11-04 HISTORY — PX: PERMANENT PACEMAKER INSERTION: SHX5480

## 2014-11-04 LAB — RESPIRATORY VIRUS PANEL
Adenovirus: NEGATIVE
Influenza A: NEGATIVE
Influenza B: NEGATIVE
METAPNEUMOVIRUS: NEGATIVE
PARAINFLUENZA 3 A: NEGATIVE
Parainfluenza 1: NEGATIVE
Parainfluenza 2: NEGATIVE
RESPIRATORY SYNCYTIAL VIRUS B: NEGATIVE
RHINOVIRUS: NEGATIVE
Respiratory Syncytial Virus A: NEGATIVE

## 2014-11-04 LAB — CULTURE, BLOOD (ROUTINE X 2)
Culture: NO GROWTH
Culture: NO GROWTH

## 2014-11-04 SURGERY — PERMANENT PACEMAKER INSERTION
Anesthesia: LOCAL

## 2014-11-04 MED ORDER — FENTANYL CITRATE 0.05 MG/ML IJ SOLN
INTRAMUSCULAR | Status: AC
  Filled 2014-11-04: qty 2

## 2014-11-04 MED ORDER — ONDANSETRON HCL 4 MG/2ML IJ SOLN: 4.0000 mg | Freq: Four times a day (QID) | INTRAMUSCULAR | Status: DC | PRN

## 2014-11-04 MED ORDER — CEFAZOLIN SODIUM-DEXTROSE 2-3 GM-% IV SOLR
INTRAVENOUS | Status: AC
  Filled 2014-11-04: qty 50

## 2014-11-04 MED ORDER — SODIUM CHLORIDE 0.9 % IJ SOLN: 3.0000 mL | Freq: Two times a day (BID) | INTRAMUSCULAR | Status: DC

## 2014-11-04 MED ORDER — FLECAINIDE ACETATE 50 MG PO TABS
50.0000 mg | ORAL_TABLET | Freq: Two times a day (BID) | ORAL | Status: DC
  Administered 2014-11-04: 18:00:00 50 mg via ORAL
  Filled 2014-11-04 (×4): qty 1

## 2014-11-04 MED ORDER — BUPIVACAINE HCL (PF) 0.25 % IJ SOLN
INTRAMUSCULAR | Status: AC
  Filled 2014-11-04: qty 60

## 2014-11-04 MED ORDER — YOU HAVE A PACEMAKER BOOK
Freq: Once | Status: AC
  Administered 2014-11-04: 22:00:00
  Filled 2014-11-04: qty 1

## 2014-11-04 MED ORDER — DILTIAZEM HCL ER COATED BEADS 180 MG PO CP24
180.0000 mg | ORAL_CAPSULE | Freq: Every day | ORAL | Status: DC
  Administered 2014-11-04 – 2014-11-05 (×2): 180 mg via ORAL
  Filled 2014-11-04 (×3): qty 1

## 2014-11-04 MED ORDER — SODIUM CHLORIDE 0.9 % IJ SOLN: 3.0000 mL | INTRAMUSCULAR | Status: DC | PRN

## 2014-11-04 MED ORDER — ACETAMINOPHEN 325 MG PO TABS: 650.0000 mg | ORAL_TABLET | ORAL | Status: DC | PRN

## 2014-11-04 MED ORDER — MIDAZOLAM HCL 5 MG/5ML IJ SOLN
INTRAMUSCULAR | Status: AC
  Filled 2014-11-04: qty 5

## 2014-11-04 MED ORDER — CEFAZOLIN SODIUM-DEXTROSE 2-3 GM-% IV SOLR: 2.0000 g | INTRAVENOUS | Status: DC

## 2014-11-04 MED ORDER — SODIUM CHLORIDE 0.9 % IV SOLN: 250.0000 mL | INTRAVENOUS | Status: DC | PRN

## 2014-11-04 NOTE — Progress Notes (Signed)
Site area:Right groin a 18 french sheath was removed  Site Prior to Removal:  Level 0  Pressure Applied For 20 MINUTES    Minutes Beginning at 1700  Manual:   Yes.    Patient Status During Pull:  stable  Post Pull Groin Site:  Level 0  Post Pull Instructions Given:  Yes.    Post Pull Pulses Present:  Yes.    Dressing Applied:  Yes.    Comments:  Pt remain stable during sheath pull.  Pt denies any discomfort at this time.

## 2014-11-04 NOTE — Progress Notes (Signed)
Dr. Johney Frame called nursing and notified that he was aware of patients rhythm, that patient is asymptomatic and plan was to give po cardizem and flecanide and observe over night. He did not feel a need to start IV cardizem and would be available if nursing had to call him tonight. Reported to Seymour Bars RN. 1915 Reported above to Donn Pierini.

## 2014-11-04 NOTE — Op Note (Signed)
SURGEON: Hillis Range, MD  Assistant: Lewayne Bunting, MD   PREPROCEDURE DIAGNOSIS: 1. Symptomatic tachycardia bradycardia syndrome 2. Atrial fibrillation   POSTPROCEDURE DIAGNOSIS:  1. Symptomatic tachycardia bradycardia syndrome 2. Atrial fibrillation   PROCEDURES:  1. Right Ventriculogram.  2. Leadless Pacemaker implantation.   INTRODUCTION:  Mercedes [name] is a 60 y.o. female with a history of symptomatic tachycardia bradycardia syndrome and atrial fibrillation who presents today for pacemaker implantation.  She has had presyncope and pauses of > 6 seconds.  She also has afib with V rates 140s.  No reversible causes are found. The patient therefore presents today for pacemaker implantation.   DESCRIPTION OF PROCEDURE: Informed written consent was obtained, and the patient was brought to the electrophysiology lab in a fasting state. The patient received IV Versed and Fentanyl as sedation for the procedure today. Using a percutaneous Seldinger technique, an 8-French was placed into the right common femoral vein.  A venogram of the right ventricle was performed by hand injection of nonionic contrast. This demonstrated a rather normal appearing right ventricle. The 8 french sheath was exchanged for an 18 french sheath. A [employer] Nanostim Leadless Cardiac Pacemaker model S1DLCP ([id number]) was advanced through the right femoral vein into the right ventricular apex position and actively fixed to the myocardial wall. In this location, R waves measured 4.5 mV with an impedance of 1700 Ohms and a threshold of 0.75 V@0 . . Stability of the device was demonstrated with a "tug test". The device was disconnected from the implantation apparatus which was then removed from the body and the sheaths were aspirated and flushed. The sheaths were removed and hemostasis was assured. EBL<38ml.  There were no early apparent complications.   CONCLUSIONS:  1.Successful implantation of a SJM nannostim VVI  investigational pacemaker.  2. No early apparent complications.    Permanent Pacemaker Indication: Documented non-reversible symptomatic bradycardia due to sinus node dysfunction.   [name],MD 4:11 PM 11/04/2014

## 2014-11-04 NOTE — Progress Notes (Signed)
Cardiac rhythm noted converted to NSR @ 1824  per tele monitoring, rhythm confirmed with EKG

## 2014-11-04 NOTE — Progress Notes (Signed)
ELECTROPHYSIOLOGY ROUNDING NOTE    SUBJECTIVE: The patient is doing well today.  At this time, she denies chest pain, shortness of breath. Fevers and chills have resolved.   Blood cultures negative to date.   CURRENT MEDICATIONS: .  ceFAZolin (ANCEF) IV  2 g Intravenous On Call  . feeding supplement (ENSURE COMPLETE)  237 mL Oral BID BM  . pantoprazole  40 mg Oral Q0600  . rosuvastatin  10 mg Oral Daily   . sodium chloride 50 mL/hr at 11/04/14 0600    OBJECTIVE: Telemetry reveals sinus rhythm with intermittent AF w RVR  Physical Exam: Filed Vitals:   11/03/14 0500 11/03/14 1500 11/03/14 2006 11/04/14 0501  BP: 117/87 108/73 137/80 124/79  Pulse: 73 81 69 89  Temp: 98.1 F (36.7 C) 98.8 F (37.1 C) 97.9 F (36.6 C) 97.8 F (36.6 C)  TempSrc: Oral Oral Oral Oral  Resp:  16 16 18   Height:      Weight: 113 lb 12.8 oz (51.619 kg)   115 lb 4.8 oz (52.3 kg)  SpO2: 98% 98% 100% 98%    Intake/Output Summary (Last 24 hours) at 11/04/14 01/04/15 Last data filed at 11/04/14 0600  Gross per 24 hour  Intake    120 ml  Output      0 ml  Net    120 ml    GEN- The patient is well appearing, alert and oriented x 3 today.  HEENT: normocephalic, atraumatic; sclera clear, conjunctiva pink; hearing intact; oropharynx clear; neck supple, no JVP Lymph- no cervical lymphadenopathy Lungs- Clear to ausculation bilaterally, normal work of breathing. No wheezes, rales, rhonchi Heart- Regular rate and rhythm, no murmurs, rubs or gallops  GI- soft, non-tender, non-distended, bowel sounds present, no hepatosplenomegaly Extremities- no clubbing, cyanosis, or edema; DP/PT/radial pulses 2+ bilaterally, left great first toe amputated MS- no significant deformity or atrophy Skin- warm and dry, no rash or lesion Psych- euthymic mood, full affect Neuro- strength and sensation are intact   LABS: CBC: No results for input(s): WBC, NEUTROABS, HGB, HCT, MCV, PLT in the last 72  hours.  RADIOLOGY: Ct Head Wo Contrast 10/28/2014   CLINICAL DATA:  A phasic stroke. Aphasia and seizure-like activity after cardiac catheterization.  EXAM: CT HEAD WITHOUT CONTRAST  TECHNIQUE: Contiguous axial images were obtained from the base of the skull through the vertex without intravenous contrast.  COMPARISON:  MRI brain 03/17/2010. CT head without contrast 02/18/2010.  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 The basal ganglia are intact. The insular ribbon is intact bilaterally. No acute cortical infarct is present. Other subcortical white matter changes are stable.  The ventricles are of normal size. No significant extraaxial fluid collection is present.  The paranasal sinuses and mastoid air cells are clear. The calvarium is intact.  IMPRESSION: 1. 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 2. Scattered subcortical white matter changes bilaterally likely reflect the sequela of chronic microvascular ischemia. 3. No acute intracranial abnormality.   Electronically Signed   By: 02/20/2010 M.D.   On: 10/28/2014 14:33   Mr Brain Wo Contrast 10/28/2014   CLINICAL DATA:  A aphasia with left arm and leg weakness following heart catheterization today. Seizure.  EXAM: MRI HEAD WITHOUT CONTRAST  TECHNIQUE: Multiplanar, multiecho pulse sequences of the brain and surrounding structures were obtained without intravenous contrast.  COMPARISON:  CT head without contrast from the same day. MRI brain 03/17/2010  FINDINGS:  The diffusion-weighted images demonstrate no evidence for acute or subacute infarction. No hemorrhage or mass lesion is present. Mild periventricular and subcortical T2 hyperintensities bilaterally are greater than expected for age. These have increased slightly since prior study.  The ventricles are of normal size. No significant extraaxial fluid collection is present.  Flow is present in the major intracranial  arteries. Globes and orbits are intact. The paranasal sinuses are clear. There is some fluid in the mastoid air cells bilaterally. No obstructing nasopharyngeal lesion is evident. Midline structures are within normal limits bilaterally.  Dedicated imaging through the temporal lobes bilaterally demonstrate symmetric size and signal of the hippocampal structures.  IMPRESSION: 1. No acute intracranial abnormality or focal lesion to explain the patient's symptoms. 2. Normal appearance of the temporal lobes. 3. Scattered periventricular and subcortical T2 hyperintensities have progressed some since the prior exam. These likely reflect the sequela of chronic microvascular ischemia. 4. Bilateral mastoid effusions. No obstructing nasopharyngeal lesion is present.   Electronically Signed   By: Marin Roberts M.D.   On: 10/28/2014 20:28    ASSESSMENT AND PLAN:  1.  Atrial fibrillation/atrial flutter Newly diagnosed this admission with >6 second post termination pauses Continue Eliquis, CHADS2VASC is 3 For Leadless pacemaker implantation today  2. CAD/Elevated troponin on admission No progression of CAD on cath this admission. Elevated troponin likely due to demand ischemia No current ischemic symptoms Continue ASA 81mg  daily as per Dr  3. HTN Stable No change required today  4. Partial seizure Per neurology  Kirke Corin, NP 11/04/2014 6:32 AM   I have seen, examined the patient, and reviewed the above assessment and plan.  Changes to above are made where necessary.  The patient has symptomatic sick sinus syndrome with transient sinus bradycardia and post termination pauses of > 6 seconds.  I would therefore recommend pacemaker implantation at this time.  Risks, benefits, alternatives to both traditional and leadless pacemaker implantation were discussed in detail with the patient today. The patient understands that the risks include but are not limited to bleeding, infection,  pneumothorax, perforation, tamponade, vascular damage, renal failure, MI, stroke, death,  and lead dislodgement and wishes to proceed with a leadless pacemaker. We will therefore plan to proceed later today. Restart eliquis when able for afib.  Will also anticipate diltiazem for rate control of afib post ppm implant.  01/04/2015 MD 11/04/2014 7:26 AM

## 2014-11-04 NOTE — Discharge Summary (Signed)
ELECTROPHYSIOLOGY PROCEDURE DISCHARGE SUMMARY    Patient ID: Mercedes [name],  MRN: [id number], DOB/AGE: November 21, 1955 59 y.o.  Admit date: 10/27/2014 Discharge date: 11/05/2014  Primary Care Physician: Rene Paci, MD Primary Cardiologist: Jens Som Electrophysiologist: Mercedes [name]  Primary Discharge Diagnosis:  Symptomatic tachycardia/bradycardia status post pacemaker implantation this admission  Secondary Discharge Diagnosis:  1.  Paroxysmal atrial fibrillation 2.  CAD s/p DES to CFx 3.  Lupus 4.  PAD s/p left fem-pop 5.  Prior osteomyelitis s/p right great toe amputation 6.  Hypertension 7.  Rheumatoid arthritis 8.  Prior Takotsubo cardiomyopathy in 2011  Allergies  Allergen Reactions  . Brilinta [Ticagrelor] Shortness Of Breath    Also chest tightness    Consults: 1.  Neurology 2.  Infection Disease  Procedures This Admission:  1.  Cardiac catheterization on 10-28-14 by Dr Kirke Corin.  This demonstrated widely patent ostial left circumflex stent, no obstructive CAD, normal LV function with inferior hypokinesis 2.  Echocardiogram 10-29-14 demonstrated EF 60-65%, concentric hypertrophy, no RWMA, grade 1 diastolic dysfunction 3.  Implantation of a STJ leadless PPM on 11-04-14 by Dr Johney Frame.  The patient received a STJ model number S1DLCP PPM. There were no immediate post procedure complications. 2.  CXR on 11-05-14 demonstrated no pneumothorax status post device implantation.   Brief HPI/Hospital Course:  Mercedes [name] is a 59 y.o. female with a past medical history significant for CAD (s/p DES to CFX 2014, no progression of CAD this admission), lupus, PAD (s/p toe amputation due to osteomyelitis and gangrene and left fem-pop), hypertension, rheumatoid arthritis, prior Takotsubo cardiomyopathy in 2011. She presented to the office 10/27/14 with chest pain and fatigue. She was found to be in a 2:1 flutter with a ventricular rate of 150bpm and was referred to the ER for further  evaluation. She was given adenosine which by report terminated the tachycardia. Catheterization this admission demonstrated widely patent left circ stent with no new obstructive CAD, EF 55%. Echo demonstrated normal LV function with concentric hypertrophy. She had an focal seizure on 10-28-14 and neurology evaluated with EEG which was normal.   Telemetry demonstrated recurrent atrial fibrillation with up to 6 second post termination pauses.  EP was asked to evaluate for treatment options.  She was seen by Dr Johney Frame who recommended leadless pacemaker implantation.  She had recurrent fevers and was seen by ID.  Blood and urine cultures remained negative and WBC was normal.  ID felt that fever could have been related to seizure.  Her fevers resolved and she underwent implantation of a STJ leadless pacemaker with details as outlined above.  She  was monitored on telemetry overnight which demonstrated sinus rhythm with intermittent atrial fibrillation.  Groin was without hematoma/bruit. The device was interrogated and found to be functioning normally.  CXR was obtained and demonstrated no pneumothorax status post device implantation.  Wound care, arm mobility, and restrictions were reviewed with the patient.  Tthe patient was examined and considered stable for discharge to home.   She had an focal seizure on 10-28-14 and neurology evaluated with EEG which was normal. Recommendations are no driving until seen by outpatient neurology and if recurrent seizures then AED therapy would be required.    Physical Exam: Filed Vitals:   11/04/14 2016 11/04/14 2321 11/05/14 0008 11/05/14 0423  BP: 113/78 127/77  115/69  Pulse: 76 74  64  Temp: 98.5 F (36.9 C) 98.9 F (37.2 C)  98.6 F (37 C)  TempSrc: Oral Oral  Oral  Resp: 20 24  15   Height:      Weight:   116 lb 2.9 oz (52.7 kg)   SpO2: 98% 97%  98%    GEN- The patient is well appearing, alert and oriented x 3 today.   HEENT: normocephalic, atraumatic; sclera  clear, conjunctiva pink; hearing intact; oropharynx clear; neck supple, no JVP Lymph- no cervical lymphadenopathy Lungs- Clear to ausculation bilaterally, normal work of breathing.  No wheezes, rales, rhonchi Heart- Irregular rate and rhythm  GI- soft, non-tender, non-distended, bowel sounds present, no hepatosplenomegaly Extremities- no clubbing, cyanosis, or edema; DP/PT/radial pulses 2+ bilaterally MS- no significant deformity or atrophy Skin- warm and dry, no rash or lesion, left groin without hematoma/bruit Psych- euthymic mood, full affect Neuro- strength and sensation are intact   Labs:   Lab Results  Component Value Date   WBC 6.7 11/01/2014   HGB 12.2 11/01/2014   HCT 37.9 11/01/2014   MCV 75.6* 11/01/2014   PLT 452* 11/01/2014     Recent Labs Lab 10/29/14 1105  NA 133*  K 3.9  CL 100  CO2 25  BUN 7  CREATININE 1.03  CALCIUM 8.6  GLUCOSE 180*    Discharge Medications:    Medication List    STOP taking these medications        aspirin 81 MG tablet      TAKE these medications        apixaban 5 MG Tabs tablet  Commonly known as:  ELIQUIS  Take 1 tablet (5 mg total) by mouth 2 (two) times daily.     diltiazem 180 MG 24 hr capsule  Commonly known as:  CARDIZEM CD  Take 1 capsule (180 mg total) by mouth daily.     lisinopril 20 MG tablet  Commonly known as:  PRINIVIL,ZESTRIL  TAKE 1 TABLET BY MOUTH DAILY     nitroGLYCERIN 0.4 MG SL tablet  Commonly known as:  NITROSTAT  Place 0.4 mg under the tongue every 5 (five) minutes as needed for chest pain.     rosuvastatin 10 MG tablet  Commonly known as:  CRESTOR  Take 1 tablet (10 mg total) by mouth daily.        Disposition:  Discharge Instructions    Diet - low sodium heart healthy    Complete by:  As directed      Discharge instructions    Complete by:  As directed   No driving until seen by neurology     Increase activity slowly    Complete by:  As directed           Follow-up  Information    Follow up with [facility].   Why:  call to schedule an appt to follow up on seizures    Contact information:   [address] [place] [phone]      Follow up with [phone], MD On 11/16/2014.   Specialty:  Cardiology   Why:  at 3:30PM   Contact information:   [address] [place] [phone]       Duration of Discharge Encounter: Greater than 30 minutes including physician time.  Signed, [phone], NP 11/05/2014 8:07 AM   I have seen, examined the patient, and reviewed the above assessment and plan.  Changes to above are made where necessary.  Will need close outpatient follow-up with me.  Co Sign: 01/05/2015, MD 11/05/2014 8:22 AM

## 2014-11-05 ENCOUNTER — Encounter (HOSPITAL_COMMUNITY): Payer: Self-pay | Admitting: Nurse Practitioner

## 2014-11-05 ENCOUNTER — Inpatient Hospital Stay (HOSPITAL_COMMUNITY): Payer: Medicare Other

## 2014-11-05 DIAGNOSIS — I4891 Unspecified atrial fibrillation: Secondary | ICD-10-CM

## 2014-11-05 LAB — BASIC METABOLIC PANEL
Anion gap: 7 (ref 5–15)
BUN: 9 mg/dL (ref 6–23)
CALCIUM: 8.8 mg/dL (ref 8.4–10.5)
CO2: 24 mmol/L (ref 19–32)
Chloride: 103 mmol/L (ref 96–112)
Creatinine, Ser: 0.82 mg/dL (ref 0.50–1.10)
GFR calc Af Amer: 88 mL/min — ABNORMAL LOW (ref >90)
GFR calc non Af Amer: 76 mL/min — ABNORMAL LOW (ref >90)
Glucose, Bld: 96 mg/dL (ref 70–99)
Potassium: 4.3 mmol/L (ref 3.5–5.1)
SODIUM: 134 mmol/L — AB (ref 135–145)

## 2014-11-05 MED ORDER — ROSUVASTATIN CALCIUM 10 MG PO TABS: 10.0000 mg | ORAL_TABLET | Freq: Every day | ORAL | Status: DC

## 2014-11-05 MED ORDER — APIXABAN 5 MG PO TABS: 5.0000 mg | ORAL_TABLET | Freq: Two times a day (BID) | ORAL | Status: DC

## 2014-11-05 MED ORDER — DILTIAZEM HCL ER COATED BEADS 180 MG PO CP24: 180.0000 mg | ORAL_CAPSULE | Freq: Every day | ORAL | Status: DC

## 2014-11-05 NOTE — Discharge Instructions (Signed)
No driving until seen by neurology. No lifting over 5 lbs for 1 week. No sexual activity for 1 week. Keep procedure site clean & dry. If you notice increased pain, swelling, bleeding or pus, call/return!  You may shower, but no soaking baths/hot tubs/pools for 1 week.     Stroke Prevention Some medical conditions and behaviors are associated with an increased chance of having a stroke. You may prevent a stroke by making healthy choices and managing medical conditions. HOW CAN I REDUCE MY RISK OF HAVING A STROKE?   Stay physically active. Get at least 30 minutes of activity on most or all days.  Do not smoke. It may also be helpful to avoid exposure to secondhand smoke.  Limit alcohol use. Moderate alcohol use is considered to be:  No more than 2 drinks per day for men.  No more than 1 drink per day for nonpregnant women.  Eat healthy foods. This involves:  Eating 5 or more servings of fruits and vegetables a day.  Making dietary changes that address high blood pressure (hypertension), high cholesterol, diabetes, or obesity.  Manage your cholesterol levels.  Making food choices that are high in fiber and low in saturated fat, trans fat, and cholesterol may control cholesterol levels.  Take any prescribed medicines to control cholesterol as directed by your health care provider.  Manage your diabetes.  Controlling your carbohydrate and sugar intake is recommended to manage diabetes.  Take any prescribed medicines to control diabetes as directed by your health care provider.  Control your hypertension.  Making food choices that are low in salt (sodium), saturated fat, trans fat, and cholesterol is recommended to manage hypertension.  Take any prescribed medicines to control hypertension as directed by your health care provider.  Maintain a healthy weight.  Reducing calorie intake and making food choices that are low in sodium, saturated fat, trans fat, and cholesterol are  recommended to manage weight.  Stop drug abuse.  Avoid taking birth control pills.  Talk to your health care provider about the risks of taking birth control pills if you are over 79 years old, smoke, get migraines, or have ever had a blood clot.  Get evaluated for sleep disorders (sleep apnea).  Talk to your health care provider about getting a sleep evaluation if you snore a lot or have excessive sleepiness.  Take medicines only as directed by your health care provider.  For some people, aspirin or blood thinners (anticoagulants) are helpful in reducing the risk of forming abnormal blood clots that can lead to stroke. If you have the irregular heart rhythm of atrial fibrillation, you should be on a blood thinner unless there is a good reason you cannot take them.  Understand all your medicine instructions.  Make sure that other conditions (such as anemia or atherosclerosis) are addressed. SEEK IMMEDIATE MEDICAL CARE IF:   You have sudden weakness or numbness of the face, arm, or leg, especially on one side of the body.  Your face or eyelid droops to one side.  You have sudden confusion.  You have trouble speaking (aphasia) or understanding.  You have sudden trouble seeing in one or both eyes.  You have sudden trouble walking.  You have dizziness.  You have a loss of balance or coordination.  You have a sudden, severe headache with no known cause.  You have new chest pain or an irregular heartbeat. Any of these symptoms may represent a serious problem that is an emergency. Do not wait  to see if the symptoms will go away. Get medical help at once. Call your local emergency services (911 in [place] Do not drive yourself to the hospital. Document Released: 09/21/2004 Document Revised: 12/29/2013 Document Reviewed: 02/14/2013 [facility] Patient Information 2015 [place], [place]. This information is not intended to replace advice given to you by your health care provider. Make sure  you discuss any questions you have with your health care provider. Atrial Flutter Atrial flutter is a heart rhythm that can cause the heart to beat very fast (tachycardia). It originates in the upper chambers of the heart (atria). In atrial flutter, the top chambers of the heart (atria) often beat much faster than the bottom chambers of the heart (ventricles). Atrial flutter has a regular "saw toothed" appearance in an EKG readout. An EKG is a test that records the electrical activity of the heart. Atrial flutter can cause the heart to beat up to 150 beats per minute (BPM). Atrial flutter can either be short lived (paroxysmal) or permanent.  CAUSES  Causes of atrial flutter can be many. Some of these include:  Heart related issues:  Heart attack (myocardial infarction).  Heart failure.  Heart valve problems.  Poorly controlled high blood pressure (hypertension).  After open heart surgery.  Lung related issues:  A blood clot in the lungs (pulmonary embolism).  Chronic obstructive pulmonary disease (COPD). Medications used to treat COPD can attribute to atrial flutter.  Other related causes:  Hyperthyroidism.  Caffeine.  Some decongestant cold medications.  Low electrolyte levels such as potassium or magnesium.  Cocaine. SYMPTOMS  An awareness of your heart beating rapidly (palpitations).  Shortness of breath.  Chest pain.  Low blood pressure (hypotension).  Dizziness or fainting. DIAGNOSIS  Different tests can be performed to diagnose atrial flutter.   An EKG.  Holter monitor. This is a 24-hour recording of your heart rhythm. You will also be given a diary. Write down all symptoms that you have and what you were doing at the time you experienced symptoms.  Cardiac event monitor. This small device can be worn for up to 30 days. When you have heart symptoms, you will push a button on the device. This will then record your heart rhythm.  Echocardiogram. This is an  imaging test to look at your heart. Your caregiver will look at your heart valves and the ventricles.  Stress test. This test can help determine if the atrial flutter is related to exercise or if coronary artery disease is present.  Laboratory studies will look at certain blood levels like:  Complete blood count (CBC).  Potassium.  Magnesium.  Thyroid function. TREATMENT  Treatment of atrial flutter varies. A combination of therapies may be used or sometimes atrial flutter may need only 1 type of treatment.  Lab work: If your blood work, such as your electrolytes (potassium, magnesium) or your thyroid function tests, are abnormal, your caregiver will treat them accordingly.  Medication:  There are several different types of medications that can convert your heart to a normal rhythm and prevent atrial flutter from reoccurring.  Nonsurgical procedures: Nonsurgical techniques may be used to control atrial flutter. Some examples include:  Cardioversion. This technique uses either drugs or an electrical shock to restore a normal heart rhythm:  Cardioversion drugs may be given through an intravenous (IV) line to help "reset" the heart rhythm.  In electrical cardioversion, your caregiver shocks your heart with electrical energy. This helps to reset the heartbeat to a normal rhythm.  Ablation. If  atrial flutter is a persistent problem, an ablation may be needed. This procedure is done under mild sedation. High frequency radio-wave energy is used to destroy the area of heart tissue responsible for atrial flutter. SEEK IMMEDIATE MEDICAL CARE IF:  You have:  Dizziness.  Near fainting or fainting.  Shortness of breath.  Chest pain or pressure.  Sudden nausea or vomiting.  Profuse sweating. If you have the above symptoms, call your local emergency service immediately! Do not drive yourself to the hospital. MAKE SURE YOU:   Understand these instructions.  Will watch your  condition.  Will get help right away if you are not doing well or get worse. Document Released: 12/31/2008 Document Revised: 12/29/2013 Document Reviewed: 12/31/2008 [facility] Patient Information 2015 [place], [place]. This information is not intended to replace advice given to you by your health care provider. Make sure you discuss any questions you have with your health care provider.  Information on my medicine - ELIQUIS (apixaban)  This medication education was reviewed with me or my healthcare representative as part of my discharge preparation.   Why was Eliquis prescribed for you? Eliquis was prescribed for you to reduce the risk of forming blood clots that can cause a stroke if you have a medical condition called atrial fibrillation (a type of irregular heartbeat) OR to reduce the risk of a blood clots forming after orthopedic surgery.  What do You need to know about Eliquis ? Take your Eliquis TWICE DAILY - one tablet in the morning and one tablet in the evening with or without food.  It would be best to take the doses about the same time each day.  If you have difficulty swallowing the tablet whole please discuss with your pharmacist how to take the medication safely.  Take Eliquis exactly as prescribed by your doctor and DO NOT stop taking Eliquis without talking to the doctor who prescribed the medication.  Stopping may increase your risk of developing a new clot or stroke.  Refill your prescription before you run out.  After discharge, you should have regular check-up appointments with your healthcare provider that is prescribing your Eliquis.  In the future your dose may need to be changed if your kidney function or weight changes by a significant amount or as you get older.  What do you do if you miss a dose? If you miss a dose, take it as soon as you remember on the same day and resume taking twice daily.  Do not take more than one dose of ELIQUIS at the same  time.  Important Safety Information A possible side effect of Eliquis is bleeding. You should call your healthcare provider right away if you experience any of the following: ? Bleeding from an injury or your nose that does not stop. ? Unusual colored urine (red or dark brown) or unusual colored stools (red or black). ? Unusual bruising for unknown reasons. ? A serious fall or if you hit your head (even if there is no bleeding).  Some medicines may interact with Eliquis and might increase your risk of bleeding or clotting while on Eliquis. To help avoid this, consult your healthcare provider or pharmacist prior to using any new prescription or non-prescription medications, including herbals, vitamins, non-steroidal anti-inflammatory drugs (NSAIDs) and supplements.  This website has more information on Eliquis (apixaban): [email or web address].com.cy.

## 2014-11-06 ENCOUNTER — Emergency Department (HOSPITAL_COMMUNITY)
Admission: EM | Admit: 2014-11-06 | Discharge: 2014-11-06 | Disposition: A | Discharge status: Home / Self Care | Payer: Medicare Other | Attending: Emergency Medicine | Admitting: Emergency Medicine

## 2014-11-06 ENCOUNTER — Telehealth: Payer: Self-pay | Admitting: Cardiology

## 2014-11-06 ENCOUNTER — Emergency Department (HOSPITAL_COMMUNITY): Payer: Medicare Other

## 2014-11-06 ENCOUNTER — Encounter (HOSPITAL_COMMUNITY): Payer: Self-pay | Admitting: Cardiology

## 2014-11-06 DIAGNOSIS — J984 Other disorders of lung: Secondary | ICD-10-CM | POA: Diagnosis not present

## 2014-11-06 DIAGNOSIS — R7881 Bacteremia: Secondary | ICD-10-CM | POA: Diagnosis not present

## 2014-11-06 DIAGNOSIS — I2510 Atherosclerotic heart disease of native coronary artery without angina pectoris: Secondary | ICD-10-CM | POA: Insufficient documentation

## 2014-11-06 DIAGNOSIS — I4891 Unspecified atrial fibrillation: Secondary | ICD-10-CM

## 2014-11-06 DIAGNOSIS — I480 Paroxysmal atrial fibrillation: Secondary | ICD-10-CM

## 2014-11-06 DIAGNOSIS — Z950 Presence of cardiac pacemaker: Secondary | ICD-10-CM | POA: Diagnosis not present

## 2014-11-06 DIAGNOSIS — Z8659 Personal history of other mental and behavioral disorders: Secondary | ICD-10-CM | POA: Diagnosis not present

## 2014-11-06 DIAGNOSIS — Z87891 Personal history of nicotine dependence: Secondary | ICD-10-CM | POA: Insufficient documentation

## 2014-11-06 DIAGNOSIS — Z8639 Personal history of other endocrine, nutritional and metabolic disease: Secondary | ICD-10-CM | POA: Diagnosis not present

## 2014-11-06 DIAGNOSIS — Z8669 Personal history of other diseases of the nervous system and sense organs: Secondary | ICD-10-CM | POA: Insufficient documentation

## 2014-11-06 DIAGNOSIS — Z9889 Other specified postprocedural states: Secondary | ICD-10-CM | POA: Insufficient documentation

## 2014-11-06 DIAGNOSIS — I10 Essential (primary) hypertension: Secondary | ICD-10-CM | POA: Diagnosis not present

## 2014-11-06 DIAGNOSIS — Z8739 Personal history of other diseases of the musculoskeletal system and connective tissue: Secondary | ICD-10-CM | POA: Diagnosis not present

## 2014-11-06 DIAGNOSIS — Z8719 Personal history of other diseases of the digestive system: Secondary | ICD-10-CM | POA: Insufficient documentation

## 2014-11-06 DIAGNOSIS — Z862 Personal history of diseases of the blood and blood-forming organs and certain disorders involving the immune mechanism: Secondary | ICD-10-CM | POA: Insufficient documentation

## 2014-11-06 DIAGNOSIS — Z7902 Long term (current) use of antithrombotics/antiplatelets: Secondary | ICD-10-CM | POA: Diagnosis not present

## 2014-11-06 DIAGNOSIS — R079 Chest pain, unspecified: Secondary | ICD-10-CM | POA: Diagnosis present

## 2014-11-06 LAB — TROPONIN I: TROPONIN I: 0.05 ng/mL — AB (ref <0.031)

## 2014-11-06 LAB — URINALYSIS, ROUTINE W REFLEX MICROSCOPIC
BILIRUBIN URINE: NEGATIVE
Glucose, UA: NEGATIVE mg/dL
Hgb urine dipstick: NEGATIVE
KETONES UR: NEGATIVE mg/dL
Nitrite: NEGATIVE
PH: 6.5 (ref 5.0–8.0)
Protein, ur: NEGATIVE mg/dL
Specific Gravity, Urine: 1.015 (ref 1.005–1.030)
Urobilinogen, UA: 1 mg/dL (ref 0.0–1.0)

## 2014-11-06 LAB — COMPREHENSIVE METABOLIC PANEL
ALK PHOS: 76 U/L (ref 39–117)
ALT: 14 U/L (ref 0–35)
ANION GAP: 8 (ref 5–15)
AST: 40 U/L — ABNORMAL HIGH (ref 0–37)
Albumin: 2.9 g/dL — ABNORMAL LOW (ref 3.5–5.2)
BUN: 10 mg/dL (ref 6–23)
CO2: 23 mmol/L (ref 19–32)
Calcium: 9.1 mg/dL (ref 8.4–10.5)
Chloride: 104 mmol/L (ref 96–112)
Creatinine, Ser: 0.83 mg/dL (ref 0.50–1.10)
GFR, EST AFRICAN AMERICAN: 87 mL/min — AB (ref >90)
GFR, EST NON AFRICAN AMERICAN: 75 mL/min — AB (ref >90)
Glucose, Bld: 121 mg/dL — ABNORMAL HIGH (ref 70–99)
POTASSIUM: 3.8 mmol/L (ref 3.5–5.1)
SODIUM: 135 mmol/L (ref 135–145)
Total Protein: 7.7 g/dL (ref 6.0–8.3)

## 2014-11-06 LAB — CBC WITH DIFFERENTIAL/PLATELET
BASOS ABS: 0.1 10*3/uL (ref 0.0–0.1)
Basophils Relative: 1 % (ref 0–1)
Eosinophils Absolute: 0.1 10*3/uL (ref 0.0–0.7)
Eosinophils Relative: 2 % (ref 0–5)
HCT: 38.5 % (ref 36.0–46.0)
Hemoglobin: 12.5 g/dL (ref 12.0–15.0)
LYMPHS ABS: 1.3 10*3/uL (ref 0.7–4.0)
Lymphocytes Relative: 26 % (ref 12–46)
MCH: 24.5 pg — ABNORMAL LOW (ref 26.0–34.0)
MCHC: 32.5 g/dL (ref 30.0–36.0)
MCV: 75.3 fL — ABNORMAL LOW (ref 78.0–100.0)
Monocytes Absolute: 0.5 10*3/uL (ref 0.1–1.0)
Monocytes Relative: 9 % (ref 3–12)
NEUTROS ABS: 3.3 10*3/uL (ref 1.7–7.7)
Neutrophils Relative %: 62 % (ref 43–77)
PLATELETS: 403 10*3/uL — AB (ref 150–400)
RBC: 5.11 MIL/uL (ref 3.87–5.11)
RDW: 15.1 % (ref 11.5–15.5)
WBC: 5.3 10*3/uL (ref 4.0–10.5)

## 2014-11-06 LAB — URINE MICROSCOPIC-ADD ON

## 2014-11-06 LAB — I-STAT CG4 LACTIC ACID, ED: Lactic Acid, Venous: 1.44 mmol/L (ref 0.5–2.0)

## 2014-11-06 MED ORDER — LISINOPRIL 20 MG PO TABS
10.0000 mg | ORAL_TABLET | Freq: Every day | ORAL | Status: DC
Start: 2014-11-06 — End: 2014-11-16

## 2014-11-06 MED ORDER — DILTIAZEM HCL ER COATED BEADS 180 MG PO CP24: 180.0000 mg | ORAL_CAPSULE | Freq: Two times a day (BID) | ORAL | Status: DC

## 2014-11-06 NOTE — ED Notes (Signed)
Pt aware of urine sample needed. Pt unable to go at this time. 

## 2014-11-06 NOTE — Telephone Encounter (Signed)
Lab called in a critical for pt: Blood Culture (drawn 10/31/14) = POSITIVE for ANAEROBIC GRAM + ROD Per Dr. Dorise Hiss reviewing - pt was contacted and informed that she needs to go back to the hospital for abx treatment. Pt stated that she has a ride (pt dtr.).  Contacted Cardiology (Dr. Jens Som and Dr. Herbie Baltimore) spoke with Dorena Bodo. And informed him of same.  Contacted Trish in Cardiac Triage [facility]) informing that pt is going in.   Staff message sent with same to all MD's involved.

## 2014-11-06 NOTE — Telephone Encounter (Signed)
You should be getting a staff message about this from Dr Laverna Peace office pt. Was instructed to go to the ER for antibiotic treatment ; her cultures grew out gram + rods

## 2014-11-06 NOTE — ED Provider Notes (Addendum)
CSN: [id number]     Arrival date & time 11/06/14  1052 History   First MD Initiated Contact with Patient 11/06/14 1112     Chief Complaint  Patient presents with  . Chest Pain     (Consider location/radiation/quality/duration/timing/severity/associated sxs/prior Treatment) HPI Comments: She presents to the ER for evaluation of possible bladder infection. Patient was called at home and told to come to the ER because she had bacteria growing out of her blood culture. Patient reports that she was hospitalized 10 days ago for tachybradycardia syndrome. She had an extended stay before she had a permanent pacemaker placed. Patient apparently had fever during the hospitalization that delayed the pacemaker placement. Blood cultures are drawn  Patient is a 59 y.o. female presenting with chest pain.  Chest Pain   Past Medical History  Diagnosis Date  . 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 PAD (peripheral artery disease)     s/p L fem pop 11/2013 in setting of 1st toe osteo/gangrene  . GERD (gastroesophageal reflux disease)   . Dizziness     ? CVA 01/2010 - carotid dopplers with no ICA stenosis  . Arnold-Chiari malformation, type I     MRI brain 02/2010  . DEPRESSION   . HYPERLIPIDEMIA   . HYPERTENSION   . CAD (coronary artery disease)     a. NSTEMI 07/2009: LHC - D2 40%, LAD irreg., EF 50% with apical AK (tako-tsubo CM);  b. Inf STEMI (04/2013): Tx Promus DES to CFX;  c. 08/2013 Lexi CL: No ischemia, dist ant/ap/inf-ap infarct, EF   . SLE (systemic lupus erythematosus) dx 05/2013    follows with rheum Dareen Piano)  . Sjogren's syndrome 06/02/13    pt denies this (12/01/13)  . Abnormal LFTs     08/2013 Lipitor 40 d/c'd and crestor 10 started   . Anxiety   . RA (rheumatoid arthritis)     prior tx by Dr. Dareen Piano  . Anemia     as a young woman  . Tachy-brady syndrome     a. s/p STJ Leadless pacemaker 11-04-2014 Dr Johney Frame  .  Paroxysmal atrial fibrillation     a. anticoagulated with Eliquis 10/2014   Past Surgical History  Procedure Laterality Date  . Coronary angioplasty  04/2013  . Tubal ligation    . Colonoscopy    . Femoral-popliteal bypass graft Left 12/02/2013    Procedure:  LEFT FEMORAL-BELOW KNEE POPLITEAL ARTERY BYPASS GRAFT;  Surgeon: Sherren Kerns, MD;  Location: [facility] OR;  Service: Vascular;  Laterality: Left;  . Amputation Left 12/02/2013    Procedure: LEFT 1ST TOE AMPUTATION;  Surgeon: Sherren Kerns, MD;  Location: [facility] OR;  Service: Vascular;  Laterality: Left;  . Left heart catheterization with coronary angiogram N/A 05/19/2013    Procedure: LEFT HEART CATHETERIZATION WITH CORONARY ANGIOGRAM;  Surgeon: Micheline Chapman, MD;  Location: [facility] CATH LAB;  Service: Cardiovascular;  Laterality: N/A;  . Percutaneous coronary stent intervention (pci-s)  05/19/2013    Procedure: PERCUTANEOUS CORONARY STENT INTERVENTION (PCI-S);  Surgeon: Micheline Chapman, MD;  Location: [facility] CATH LAB;  Service: Cardiovascular;;  . Abdominal aortagram N/A 11/24/2013    Procedure: ABDOMINAL AORTAGRAM WITH BILATERAL RUNOFF WITH POSSIBLE INTERVENTION;  Surgeon: Chuck Hint, MD;  Location: [facility] CATH LAB;  Service: Cardiovascular;  Laterality: N/A;  . Left heart catheterization with coronary angiogram N/A 10/28/2014    Procedure: LEFT HEART CATHETERIZATION WITH CORONARY ANGIOGRAM;  Surgeon: Iran Ouch, MD;  Location[facility] CATH LAB;  Service: Cardiovascular;  Laterality: N/A;  . Permanent pacemaker insertion N/A 11/04/2014    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   Family History  Problem Relation Age of Onset  . Arthritis Mother   . Emphysema Mother   . Arthritis Father   . COPD Father   . Diabetes Other    History  Substance Use Topics  . Smoking status: Former Smoker    Types: Cigarettes    Quit date: 05/19/2013  . Smokeless tobacco: Never Used     Comment:    . Alcohol Use: 0.0 oz/week    0  Standard drinks or equivalent per week   OB History    No data available     Review of Systems  Cardiovascular: Positive for chest pain.  All other systems reviewed and are negative.     Allergies  Brilinta  Home Medications   Prior to Admission medications   Medication Sig Start Date End Date Taking? Authorizing Provider  apixaban (ELIQUIS) 5 MG TABS tablet Take 1 tablet (5 mg total) by mouth 2 (two) times daily. 11/05/14  Yes [name], NP  diltiazem (CARDIZEM CD) 180 MG 24 hr capsule Take 1 capsule (180 mg total) by mouth daily. 11/05/14  Yes [name], NP  lisinopril (PRINIVIL,ZESTRIL) 20 MG tablet TAKE 1 TABLET BY MOUTH DAILY 02/23/14  Yes Newt Lukes, MD  rosuvastatin (CRESTOR) 10 MG tablet Take 1 tablet (10 mg total) by mouth daily. 11/05/14  Yes [name], NP  nitroGLYCERIN (NITROSTAT) 0.4 MG SL tablet Place 0.4 mg under the tongue every 5 (five) minutes as needed for chest pain.    Historical Provider, MD   BP 108/75 mmHg  Pulse 62  Temp(Src) 98.4 F (36.9 C) (Oral)  Resp 16  Ht 5\' 6"  (1.676 m)  Wt 116 lb (52.617 kg)  BMI 18.73 kg/m2  SpO2 98% Physical Exam  Constitutional: She is oriented to person, place, and time. She appears well-developed and well-nourished. No distress.  HENT:  Head: Normocephalic and atraumatic.  Right Ear: Hearing normal.  Left Ear: Hearing normal.  Nose: Nose normal.  Mouth/Throat: Oropharynx is clear and moist and mucous membranes are normal.  Eyes: Conjunctivae and EOM are normal. Pupils are equal, round, and reactive to light.  Neck: Normal range of motion. Neck supple.  Cardiovascular: Regular rhythm, S1 normal and S2 normal.  Exam reveals no gallop and no friction rub.   No murmur heard. Pulmonary/Chest: Effort normal and breath sounds normal. No respiratory distress. She exhibits no tenderness.  Abdominal: Soft. Normal appearance and bowel sounds are normal. There is no hepatosplenomegaly. There is no tenderness.  There is no rebound, no guarding, no tenderness at McBurney's point and negative Murphy's sign. No hernia.  Musculoskeletal: Normal range of motion.  Neurological: She is alert and oriented to person, place, and time. She has normal strength. No cranial nerve deficit or sensory deficit. Coordination normal. GCS eye subscore is 4. GCS verbal subscore is 5. GCS motor subscore is 6.  Skin: Skin is warm, dry and intact. No rash noted. No cyanosis.  Psychiatric: She has a normal mood and affect. Her speech is normal and behavior is normal. Thought content normal.  Nursing note and vitals reviewed.   ED Course  Procedures (including critical care time) Labs Review Labs Reviewed  CBC WITH DIFFERENTIAL/PLATELET - Abnormal; Notable for the following:    MCV 75.3 (*)  MCH 24.5 (*)    Platelets 403 (*)    All other components within normal limits  CULTURE, BLOOD (ROUTINE X 2)  CULTURE, BLOOD (ROUTINE X 2)  COMPREHENSIVE METABOLIC PANEL  URINALYSIS, ROUTINE W REFLEX MICROSCOPIC  TROPONIN I  I-STAT CG4 LACTIC ACID, ED    Imaging Review Dg Chest 2 View  11/06/2014   CLINICAL DATA:  Bacteremia.  Leadless pacemaker installed.  EXAM: CHEST  2 VIEW  COMPARISON:  11/05/2014  FINDINGS: The orientation and location of the leadless RV pacer is unchanged. Heart size within normal limits. Minimal scarring in the left lower lobe.  Mild thoracic spondylosis. Coronary artery stent noted. No pleural effusion.  IMPRESSION: 1. Stable minimal scarring, left lower lobe. The leadless pacer device orientation and appearance is unchanged.   Electronically Signed   By: Gaylyn Rong M.D.   On: 11/06/2014 12:58   Dg Chest 2 View  11/05/2014   CLINICAL DATA:  Pacemaker  EXAM: CHEST  2 VIEW  COMPARISON:  10/30/2006  FINDINGS: Normal heart size. No pneumothorax. Clear lungs. Hyperaeration. A tiny electronic I object projects over the right ventricle apex.  IMPRESSION: Right ventricular pacemaker placement without  pneumothorax.   Electronically Signed   By: Jolaine Click M.D.   On: 11/05/2014 07:45     EKG Interpretation   Date/Time:  Friday November 06 2014 10:58:44 EST Ventricular Rate:  101 PR Interval:    QRS Duration: 84 QT Interval:  336 QTC Calculation: 435 R Axis:   -56 Text Interpretation:  Atrial fibrillation with rapid ventricular response  with premature ventricular or aberrantly conducted complexes RSR' or QR  pattern in V1 suggests right ventricular conduction delay Left anterior  fascicular block Abnormal ECG afib/flutter new since prior tracings  Confirmed by Deandrea Rion  MD, Symphoni Helbling ([id number]) on 11/06/2014 11:44:59 AM      MDM   Final diagnoses:  Bacteremia  Atrial fibrillation with RVR    Patient presents to the ER for evaluation of possible bacteremia. Patient was recently hospitalized for tachybradycardia syndrome and had a pacemaker placed. During her hospitalization she had blood cultures performed. They have subsequently come back positive for gram-positive rods. Blood cultures came positive at day 5. The fact that it took so long and it is a gram-positive rod makes this suspicious for contamination of skin flora. I did discuss this with Dr. Judyann Munson, on call for infectious disease. She agreed with repeating blood cultures but not initiating any antibiotics. Patient has not had a fever since she left the hospital. Labs are normal. She has no symptoms. No signs of infection on workup today.  Cardiology was also consulted. Dr. Johney Frame has seen the patient. He will increase her rate control. He will also see her in the office Monday for a recheck. Return for any fever.  Gilda Crease, MD 11/06/14 1647  Gilda Crease, MD 11/19/14 [phone]

## 2014-11-06 NOTE — Research (Signed)
Research received call from pt at 11:15am-Pt states she was called by PCP and advised to report to the ER due to Positive blood culture results. S/P Leadless Pacemaker implant 11/04/2014 by Dr. Johney Frame. Pt states she is in the ER and wanted to make sure Research and MD aware. I spoke with Dr. Johney Frame personally with update and info.

## 2014-11-06 NOTE — Discharge Instructions (Signed)
Return to the ER at any point for fever. See Dr. Johney Frame on Monday as scheduled.  Bacteremia Bacteremia occurs when bacteria get in your blood. Normal blood does not usually have bacteria. Bacteremia is one way infections can spread from one part of the body to another. CAUSES   Causes may include anything that allows bacteria to get into the body. Examples are:  Catheters.  Intravenous (IV) access tubes.  Cuts or scrapes of the skin.  Temporary bacteremia may occur during dental procedures, while brushing your teeth, or during a bowel movement. This rarely causes any symptoms or medical problems.  Bacteria may also get in the bloodstream as a complication of a bacterial infection elsewhere. This includes infected wounds and bacterial infections of the:  Lungs (pneumonia).  Kidneys (pyelonephritis).  Intestines (enteritis, colitis).  Organs in the abdomen (appendicitis, cholecystitis, diverticulitis). SYMPTOMS  The body is usually able to clear small numbers of bacteria out of the blood quickly. Brief bacteremia usually does not cause problems.   Problems can occur if the bacteria start to grow in number or spread to other parts of the body. If the bacteria start growing, you may develop:  Chills.  Fever.  Nausea.  Vomiting.  Sweating.  Lightheadedness and low blood pressure.  Pain.  If bacteria start to grow in the linings around the brain, it is called meningitis. This can cause severe headaches, many other problems, and even death.  If bacteria start to grow in a joint, it causes arthritis with painful joints. If bacteria start to grow in a bone, it is called osteomyelitis.  Bacteria from the blood can also cause sores (abscesses) in many organs, such as the muscle, liver, spleen, lungs, brain, and kidneys. DIAGNOSIS   This condition is diagnosed by cultures of the blood.  Cultures may also be taken from other parts of the body that are thought to be causing the  bacteremia. A small piece of tissue, fluid, or other product of the body is sampled. The sample is then put on a growth plate to see if any bacteria grows.  Other lab tests may be done and the results may be abnormal. TREATMENT  Treatment requires a stay in the hospital. You will be given antibiotic medicine through an IV access tube. PREVENTION  People with an increased risk of developing bacteremia or complications may be given antibiotics before certain procedures. Examples are:  A person with a heart murmur or artificial heart valve, before having his or her teeth cleaned.  Before having a surgical or other invasive procedure.  Before having a bowel procedure. Document Released: 05/28/2006 Document Revised: 11/06/2011 Document Reviewed: 03/09/2011 [facility] Patient Information 2015 [place], [place]. This information is not intended to replace advice given to you by your health care provider. Make sure you discuss any questions you have with your health care provider.  Atrial Fibrillation Atrial fibrillation is a condition that causes your heart to beat irregularly. It may also cause your heart to beat faster than normal. Atrial fibrillation can prevent your heart from pumping blood normally. It increases your risk of stroke and heart problems. HOME CARE  Take medications as told by your doctor.  Only take medications that your doctor says are safe. Some medications can make the condition worse or happen again.  If blood thinners were prescribed by your doctor, take them exactly as told. Too much can cause bleeding. Too little and you will not have the needed protection against stroke and other problems.  Perform  blood tests at home if told by your doctor.  Perform blood tests exactly as told by your doctor.  Do not drink alcohol.  Do not drink beverages with caffeine such as coffee, soda, and some teas.  Maintain a healthy weight.  Do not use diet pills unless your doctor says  they are safe. They may make heart problems worse.  Follow diet instructions as told by your doctor.  Exercise regularly as told by your doctor.  Keep all follow-up appointments. GET HELP IF:  You notice a change in the speed, rhythm, or strength of your heartbeat.  You suddenly begin peeing (urinating) more often.  You get tired more easily when moving or exercising. GET HELP RIGHT AWAY IF:   You have chest or belly (abdominal) pain.  You feel sick to your stomach (nauseous).  You are short of breath.  You suddenly have swollen feet and ankles.  You feel dizzy.  You face, arms, or legs feel numb or weak.  There is a change in your vision or speech. MAKE SURE YOU:   Understand these instructions.  Will watch your condition.  Will get help right away if you are not doing well or get worse. Document Released: 05/23/2008 Document Revised: 12/29/2013 Document Reviewed: 09/24/2012 [facility] Patient Information 2015 [place], [place]. This information is not intended to replace advice given to you by your health care provider. Make sure you discuss any questions you have with your health care provider.

## 2014-11-06 NOTE — Consult Note (Signed)
CARDIOLOGY CONSULT NOTE   Patient ID: Mercedes [name] MRN: [id number], DOB/AGE: 1956/06/16   Admit date: 11/06/2014 Date of Consult: 11/06/2014   Primary Physician: Rene Paci, MD Primary Cardiologist: Dr. Jens Som Primary Electrophysiologist: Dr. Johney Frame  Pt. Profile  59 year old AA female with past medical history of takotsubo cardiomyopathy with improved EF, history of PAD s/p L fem-pop, HLD, HTN, CAD s/p DES to left circumflex, lupus/RA, history of PAF on Eliquis, and recent tachybradycardia s/p St. Jude leadless pacemaker on 11/04/2014. Patient presented to ED after received call about positive blood culture  Problem List  Past Medical History  Diagnosis Date  . 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 PAD (peripheral artery disease)     s/p L fem pop 11/2013 in setting of 1st toe osteo/gangrene  . GERD (gastroesophageal reflux disease)   . Dizziness     ? CVA 01/2010 - carotid dopplers with no ICA stenosis  . Arnold-Chiari malformation, type I     MRI brain 02/2010  . DEPRESSION   . HYPERLIPIDEMIA   . HYPERTENSION   . CAD (coronary artery disease)     a. NSTEMI 07/2009: LHC - D2 40%, LAD irreg., EF 50% with apical AK (tako-tsubo CM);  b. Inf STEMI (04/2013): Tx Promus DES to CFX;  c. 08/2013 Lexi CL: No ischemia, dist ant/ap/inf-ap infarct, EF   . SLE (systemic lupus erythematosus) dx 05/2013    follows with rheum Dareen Piano)  . Sjogren's syndrome 06/02/13    pt denies this (12/01/13)  . Abnormal LFTs     08/2013 Lipitor 40 d/c'd and crestor 10 started   . Anxiety   . RA (rheumatoid arthritis)     prior tx by Dr. Dareen Piano  . Anemia     as a young woman  . Tachy-brady syndrome     a. s/p STJ Leadless pacemaker 11-04-2014 Dr Johney Frame  . Paroxysmal atrial fibrillation     a. anticoagulated with Eliquis 10/2014    Past Surgical History  Procedure Laterality Date  . Coronary angioplasty  04/2013  . Tubal  ligation    . Colonoscopy    . Femoral-popliteal bypass graft Left 12/02/2013    Procedure:  LEFT FEMORAL-BELOW KNEE POPLITEAL ARTERY BYPASS GRAFT;  Surgeon: Sherren Kerns, MD;  Location: [facility] OR;  Service: Vascular;  Laterality: Left;  . Amputation Left 12/02/2013    Procedure: LEFT 1ST TOE AMPUTATION;  Surgeon: Sherren Kerns, MD;  Location: [facility] OR;  Service: Vascular;  Laterality: Left;  . Left heart catheterization with coronary angiogram N/A 05/19/2013    Procedure: LEFT HEART CATHETERIZATION WITH CORONARY ANGIOGRAM;  Surgeon: Micheline Chapman, MD;  Location: [facility] CATH LAB;  Service: Cardiovascular;  Laterality: N/A;  . Percutaneous coronary stent intervention (pci-s)  05/19/2013    Procedure: PERCUTANEOUS CORONARY STENT INTERVENTION (PCI-S);  Surgeon: Micheline Chapman, MD;  Location: [facility] CATH LAB;  Service: Cardiovascular;;  . Abdominal aortagram N/A 11/24/2013    Procedure: ABDOMINAL AORTAGRAM WITH BILATERAL RUNOFF WITH POSSIBLE INTERVENTION;  Surgeon: Chuck Hint, MD;  Location[facility] C[facility] LAB;  Service: Cardiovascular;  Laterality: N/A;  . Left heart catheterization with coronary angiogram N/A 10/28/2014    Procedure: LEFT HEART CATHETERIZATION WITH CORONARY ANGIOGRAM;  Surgeon: Iran Ouch, MD;  Location: [facility] CATH LAB;  Service: Cardiovascular;  Laterality: N/A;  . Permanent pacemaker insertion N/A 11/04/2014    STJ Leadless pacemaker implanted by Dr Johney Frame for  tachy/brady     Allergies  Allergies  Allergen Reactions  . Brilinta [Ticagrelor] Shortness Of Breath    Also chest tightness    HPI   Patient is a 59 year old AA female with past medical history of takotsubo cardiomyopathy with improved EF, history of PAD s/p L fem-pop, HLD, HTN, CAD s/p DES to LCx, lupus/RA, history of PAF on Eliquis, and recent tachybradycardia s/p St. Jude leadless pacemaker on 11/04/2014. Patient initially presented to office on 10/27/2014 for evaluation of chest pain and fatigue, she was found to be in  2:1 atrial flutter with RVR. She was admitted to [facility] for further evaluation. She was given adenosine which by report terminated the tachycardia. Cardiac catheterization done on 10/28/2014 demonstrated widely patent ostial left circumflex stent, no obstructive CAD, normal LV function with inferior hypokinesis. She had recurrent atrial fibrillation with up to 6 second posttermination pauses. She was seen by electrophysiology team and felt to have tachybradycardia syndrome. Prior to leaving intervention, patient has some fever, she was seen by infectious disease. Both blood culture and urine culture was obtained, it was felt her fever may have been related to seizure-like activity. She underwent implantation of St. Jude leadless PPM on 11/04/2014 by Dr. Johney Frame. She was eventually discharged from the hospital on 11/05/2014.  Patient received a call from her PCPs office telling her to go to [facility] for evaluation of bacteremia after one of the 4 blood culture obtained during the last admission came back positive for gram-positive rods. At this time, patient denies any fever or chill. She has no awareness of any palpitation. She has been feeling good after discharge. While in the ED, patient is noted to be in atrial flutter with variable AV conduction. His symptoms patient is going in and out of atrial fib at this time. EP has been consulted to evaluate.   No current facility-administered medications on file prior to encounter.   Current Outpatient Prescriptions on File Prior to Encounter  Medication Sig Dispense Refill  . apixaban (ELIQUIS) 5 MG TABS tablet Take 1 tablet (5 mg total) by mouth 2 (two) times daily. 60 tablet 1  . diltiazem (CARDIZEM CD) 180 MG 24 hr capsule Take 1 capsule (180 mg total) by mouth daily. 30 capsule 1  . lisinopril (PRINIVIL,ZESTRIL) 20 MG tablet TAKE 1 TABLET BY MOUTH DAILY 30 tablet 5  . rosuvastatin (CRESTOR) 10 MG tablet Take 1 tablet (10 mg total) by  mouth daily. 30 tablet 1  . nitroGLYCERIN (NITROSTAT) 0.4 MG SL tablet Place 0.4 mg under the tongue every 5 (five) minutes as needed for chest pain.       Family History Family History  Problem Relation Age of Onset  . Arthritis Mother   . Emphysema Mother   . Arthritis Father   . COPD Father   . Diabetes Other      Social History History   Social History  . Marital Status: Single    Spouse Name: N/A  . Number of Children: 3  . Years of Education: N/A   Occupational History  . Not on file.   Social History Main Topics  . Smoking status: Former Smoker    Types: Cigarettes    Quit date: 05/19/2013  . Smokeless tobacco: Never Used     Comment:    . Alcohol Use: 0.0 oz/week    0 Standard drinks or equivalent per week  . Drug Use: No  . Sexual Activity: Not on file  Other Topics Concern  . Not on file   Social History Narrative   Lives alone.     Review of Systems  General:  No chills, fever, night sweats or weight changes.  Cardiovascular:  No chest pain, dyspnea on exertion, edema, orthopnea, palpitations, paroxysmal nocturnal dyspnea. Dermatological: No rash, lesions/masses Respiratory: No cough, dyspnea Urologic: No hematuria, dysuria Abdominal:   No nausea, vomiting, diarrhea, bright red blood per rectum, melena, or hematemesis Neurologic:  No visual changes, wkns, changes in mental status. All other systems reviewed and are otherwise negative except as noted above.  Physical Exam  Blood pressure 108/75, pulse 62, temperature 98.4 F (36.9 C), temperature source Oral, resp. rate 16, height 5\' 6"  (1.676 m), weight 116 lb (52.617 kg), SpO2 98 %.  General: Pleasant, NAD Psych: Normal affect. Neuro: Alert and oriented X 3. Moves all extremities spontaneously. HEENT: Normal  Neck: Supple without bruits or JVD. Lungs:  Resp regular and unlabored, CTA. Heart: RRR no s3, s4, or murmurs. Abdomen: Soft, non-tender, non-distended, BS + x 4.  Extremities: No  clubbing, cyanosis or edema. DP/PT/Radials 2+ and equal bilaterally.  Labs   Recent Labs  11/06/14 1317  TROPONINI 0.05*   Lab Results  Component Value Date   WBC 5.3 11/06/2014   HGB 12.5 11/06/2014   HCT 38.5 11/06/2014   MCV 75.3* 11/06/2014   PLT 403* 11/06/2014    Recent Labs Lab 11/06/14 1317  NA 135  K 3.8  CL 104  CO2 23  BUN 10  CREATININE 0.83  CALCIUM 9.1  PROT 7.7  BILITOT <0.1*  ALKPHOS 76  ALT 14  AST 40*  GLUCOSE 121*   Lab Results  Component Value Date   CHOL 132 10/28/2014   HDL 38* 10/28/2014   LDLCALC 79 10/28/2014   TRIG 74 10/28/2014   Lab Results  Component Value Date   DDIMER 0.63* 10/05/2013    Radiology/Studies  Dg Chest 2 View  11/06/2014   CLINICAL DATA:  Bacteremia.  Leadless pacemaker installed.  EXAM: CHEST  2 VIEW  COMPARISON:  11/05/2014  FINDINGS: The orientation and location of the leadless RV pacer is unchanged. Heart size within normal limits. Minimal scarring in the left lower lobe.  Mild thoracic spondylosis. Coronary artery stent noted. No pleural effusion.  IMPRESSION: 1. Stable minimal scarring, left lower lobe. The leadless pacer device orientation and appearance is unchanged.   Electronically Signed   By: Gaylyn Rong M.D.   On: 11/06/2014 12:58   Dg Chest 2 View  11/05/2014   CLINICAL DATA:  Pacemaker  EXAM: CHEST  2 VIEW  COMPARISON:  10/30/2006  FINDINGS: Normal heart size. No pneumothorax. Clear lungs. Hyperaeration. A tiny electronic I object projects over the right ventricle apex.  IMPRESSION: Right ventricular pacemaker placement without pneumothorax.   Electronically Signed   By: Jolaine Click M.D.   On: 11/05/2014 07:45   Dg Chest 2 View  10/30/2014   CLINICAL DATA:  Fever, chills, cough  EXAM: CHEST  2 VIEW  COMPARISON:  10/04/2013  FINDINGS: Cardiomediastinal silhouette is stable. Mild hyperinflation again noted. No acute infiltrate or pulmonary edema. Stable bilateral basilar mild atelectasis or  scarring. Mild degenerative changes mid thoracic spine.  IMPRESSION: No active disease. Mild hyperinflation. Stable bilateral basilar atelectasis or scarring.   Electronically Signed   By: Natasha Mead M.D.   On: 10/30/2014 08:20   Ct Head Wo Contrast  10/28/2014   CLINICAL DATA:  A phasic stroke. Aphasia and seizure-like  activity after cardiac catheterization.  EXAM: CT HEAD WITHOUT CONTRAST  TECHNIQUE: Contiguous axial images were obtained from the base of the skull through the vertex without intravenous contrast.  COMPARISON:  MRI brain 03/17/2010. CT head without contrast 02/18/2010.  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 The basal ganglia are intact. The insular ribbon is intact bilaterally. No acute cortical infarct is present. Other subcortical white matter changes are stable.  The ventricles are of normal size. No significant extraaxial fluid collection is present.  The paranasal sinuses and mastoid air cells are clear. The calvarium is intact.  IMPRESSION: 1. 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 2. Scattered subcortical white matter changes bilaterally likely reflect the sequela of chronic microvascular ischemia. 3. No acute intracranial abnormality.   Electronically Signed   By: Marin Roberts M.D.   On: 10/28/2014 14:33   Mr Brain Wo Contrast  10/28/2014   CLINICAL DATA:  A aphasia with left arm and leg weakness following heart catheterization today. Seizure.  EXAM: MRI HEAD WITHOUT CONTRAST  TECHNIQUE: Multiplanar, multiecho pulse sequences of the brain and surrounding structures were obtained without intravenous contrast.  COMPARISON:  CT head without contrast from the same day. MRI brain 03/17/2010  FINDINGS: The diffusion-weighted images demonstrate no evidence for acute or subacute infarction. No hemorrhage or mass lesion is present. Mild periventricular and subcortical T2 hyperintensities bilaterally  are greater than expected for age. These have increased slightly since prior study.  The ventricles are of normal size. No significant extraaxial fluid collection is present.  Flow is present in the major intracranial arteries. Globes and orbits are intact. The paranasal sinuses are clear. There is some fluid in the mastoid air cells bilaterally. No obstructing nasopharyngeal lesion is evident. Midline structures are within normal limits bilaterally.  Dedicated imaging through the temporal lobes bilaterally demonstrate symmetric size and signal of the hippocampal structures.  IMPRESSION: 1. No acute intracranial abnormality or focal lesion to explain the patient's symptoms. 2. Normal appearance of the temporal lobes. 3. Scattered periventricular and subcortical T2 hyperintensities have progressed some since the prior exam. These likely reflect the sequela of chronic microvascular ischemia. 4. Bilateral mastoid effusions. No obstructing nasopharyngeal lesion is present.   Electronically Signed   By: Marin Roberts M.D.   On: 10/28/2014 20:28    ECG  A-fib with HR 100  ASSESSMENT AND PLAN  1. Paroxysmal atrial fib/flutter with variable AV conduction  - Patient is going in and out of atrial flutter during the interview  - discussed with Dr. Johney Frame, will increase her diltiazem to 180mg  BID given the presence of recently placed PPM. Cut lisinopril by half  - per Dr. Johney Frame, patient will be discharged from ED, followup arranged. I have given patient new Rx for 10mg  daily lisinopril and 180mg  BID of diltiazem.   2. Possible bacteremia  - only 1 out of 4 blood culture came back positive for gram positives brought. Negative urine culture.   - Unclear if should believe this culture which may be contaminated  3. tachybradycardia status post St. Jude leadless pacemaker on 11/04/2014 4. takotsubo cardiomyopathy with improved EF 5. history of PAD s/p L fem-pop 6. HLD 7. HTN 8. CAD s/p DES to left  circumflex 9. Lupus/RA 10. history of PAF on Eliquis,   Signed, Azalee Course, PA-C 11/06/2014, 3:46 PM   I have seen, examined the patient, and reviewed the above assessment and plan.  Changes  to above are made where necessary.  Pt with known afib.  Currently asymptomatic.  She also had fevers last week with 3:4 blood cultures at that time negative.  Now presents with 1:4 positive cultures for GPR.  I have spoken with Dr Drue Second (ID) who agrees that this is likely a contaminant.  She and I agree that without any symptoms there is no indication for antibiotics presently. CXR reveals stable leadless pacemaker position.  No indication to interrogate the device today.  Plan: Resume home medicine Increase diltiazem CD to 180mg  BID Reduce lisinopril to 10mg  daily  DC to home  Follow-up with me on Monday.   Co Sign: , MD 11/06/2014 4:29 PM

## 2014-11-06 NOTE — ED Notes (Signed)
Charge rn at bedside to interrogate pacemaker

## 2014-11-06 NOTE — ED Notes (Signed)
Pt reports she had a pacemaker place don the 9th and was called and told to come to the ED for possible infection. Pt denies any pain at this time.

## 2014-11-07 LAB — CULTURE, BLOOD (ROUTINE X 2): Culture: NO GROWTH

## 2014-11-09 ENCOUNTER — Ambulatory Visit (INDEPENDENT_AMBULATORY_CARE_PROVIDER_SITE_OTHER): Payer: Medicare Other | Admitting: Internal Medicine

## 2014-11-09 ENCOUNTER — Encounter: Payer: Self-pay | Admitting: Internal Medicine

## 2014-11-09 VITALS — BP 140/82 | HR 79 | Ht 66.0 in | Wt 115.8 lb

## 2014-11-09 DIAGNOSIS — I495 Sick sinus syndrome: Secondary | ICD-10-CM

## 2014-11-09 NOTE — Progress Notes (Signed)
Electrophysiology Office Note   Date:  11/09/2014   ID:  Mercedes [name], DOB 1956/03/31, MRN [id number]  PCP:  Rene Paci, MD   Primary Electrophysiologist: Hillis Range, MD    Chief Complaint  Patient presents with  . Appointment    Symptomatic TB syndrome & AFIB     History of Present Illness: Mercedes [name] is a 59 y.o. female who presents today for electrophysiology evaluation.   She has done well over the weekend.  Her cultures are reviewed and negative. No symptoms of infection/ bacteremia.  Unaware of afib or RVR.  Tolerating increased diltiazem. Not smoking since discharge  Today, she denies symptoms of palpitations, chest pain, shortness of breath, orthopnea, PND, lower extremity edema, claudication, dizziness, presyncope, syncope, bleeding, or neurologic sequela. The patient is tolerating medications without difficulties and is otherwise without complaint today.    Past Medical History  Diagnosis Date  . 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 PAD (peripheral artery disease)     s/p L fem pop 11/2013 in setting of 1st toe osteo/gangrene  . GERD (gastroesophageal reflux disease)   . Dizziness     ? CVA 01/2010 - carotid dopplers with no ICA stenosis  . Arnold-Chiari malformation, type I     MRI brain 02/2010  . DEPRESSION   . HYPERLIPIDEMIA   . HYPERTENSION   . CAD (coronary artery disease)     a. NSTEMI 07/2009: LHC - D2 40%, LAD irreg., EF 50% with apical AK (tako-tsubo CM);  b. Inf STEMI (04/2013): Tx Promus DES to CFX;  c. 08/2013 Lexi CL: No ischemia, dist ant/ap/inf-ap infarct, EF   . SLE (systemic lupus erythematosus) dx 05/2013    follows with rheum Dareen Piano)  . Sjogren's syndrome 06/02/13    pt denies this (12/01/13)  . Abnormal LFTs     08/2013 Lipitor 40 d/c'd and crestor 10 started   . Anxiety   . RA (rheumatoid arthritis)     prior tx by Dr. Dareen Piano  . Anemia     as a  young woman  . Tachy-brady syndrome     a. s/p STJ Leadless pacemaker 11-04-2014 Dr Johney Frame  . Paroxysmal atrial fibrillation     a. anticoagulated with Eliquis 10/2014   Past Surgical History  Procedure Laterality Date  . Coronary angioplasty  04/2013  . Tubal ligation    . Colonoscopy    . Femoral-popliteal bypass graft Left 12/02/2013    Procedure:  LEFT FEMORAL-BELOW KNEE POPLITEAL ARTERY BYPASS GRAFT;  Surgeon: Sherren Kerns, MD;  Location: [facility] OR;  Service: Vascular;  Laterality: Left;  . Amputation Left 12/02/2013    Procedure: LEFT 1ST TOE AMPUTATION;  Surgeon: Sherren Kerns, MD;  Location: [facility] OR;  Service: Vascular;  Laterality: Left;  . Left heart catheterization with coronary angiogram N/A 05/19/2013    Procedure: LEFT HEART CATHETERIZATION WITH CORONARY ANGIOGRAM;  Surgeon: Micheline Chapman, MD;  Location: [facility] CATH LAB;  Service: Cardiovascular;  Laterality: N/A;  . Percutaneous coronary stent intervention (pci-s)  05/19/2013    Procedure: PERCUTANEOUS CORONARY STENT INTERVENTION (PCI-S);  Surgeon: Micheline Chapman, MD;  Location: [facility] CATH LAB;  Service: Cardiovascular;;  . Abdominal aortagram N/A 11/24/2013    Procedure: ABDOMINAL AORTAGRAM WITH BILATERAL RUNOFF WITH POSSIBLE INTERVENTION;  Surgeon: Chuck Hint, MD;  Location: [facility] CATH LAB;  Service: Cardiovascular;  Laterality: N/A;  . Left heart catheterization with  coronary angiogram N/A 10/28/2014    Procedure: LEFT HEART CATHETERIZATION WITH CORONARY ANGIOGRAM;  Surgeon: Iran Ouch, MD;  Location: [facility] CATH LAB;  Service: Cardiovascular;  Laterality: N/A;  . Permanent pacemaker insertion N/A 11/04/2014    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     Current Outpatient Prescriptions  Medication Sig Dispense Refill  . apixaban (ELIQUIS) 5 MG TABS tablet Take 1 tablet (5 mg total) by mouth 2 (two) times daily. 60 tablet 1  . diltiazem (CARDIZEM CD) 180 MG 24 hr capsule Take 1 capsule (180 mg total)  by mouth 2 (two) times daily. 60 capsule 1  . lisinopril (PRINIVIL,ZESTRIL) 20 MG tablet Take 0.5 tablets (10 mg total) by mouth daily. 30 tablet 5  . nitroGLYCERIN (NITROSTAT) 0.4 MG SL tablet Place 0.4 mg under the tongue every 5 (five) minutes as needed for chest pain (MAX 3 TABLETS).     . rosuvastatin (CRESTOR) 10 MG tablet Take 1 tablet (10 mg total) by mouth daily. 30 tablet 1   No current facility-administered medications for this visit.    Allergies:   Brilinta   Social History:  The patient  reports that she quit smoking about 17 months ago. Her smoking use included Cigarettes. She has never used smokeless tobacco. She reports that she drinks alcohol. She reports that she does not use illicit drugs.   Family History:  The patient's  family history includes Arthritis in her father and mother; COPD in her father; Diabetes in her other; Emphysema in her mother.    ROS:  Please see the history of present illness.   All other systems are reviewed and negative.    PHYSICAL EXAM: VS:  BP 140/82 mmHg  Pulse 79  Ht 5\' 6"  (1.676 m)  Wt 115 lb 12.8 oz (52.527 kg)  BMI 18.70 kg/m2 , BMI Body mass index is 18.7 kg/(m^2). GEN: Well nourished, well developed, in no acute distress HEENT: normal Neck: no JVD, carotid bruits, or masses Cardiac: RRR; no murmurs, rubs, or gallops,no edema  Respiratory:  clear to auscultation bilaterally, normal work of breathing GI: soft, nontender, nondistended, + BS MS: no deformity or atrophy Skin: warm and dry  Neuro:  Strength and sensation are intact Psych: euthymic mood, full affect   Recent Labs: 10/27/2014: Magnesium 1.8 10/29/2014: TSH 0.571 11/06/2014: ALT 14; BUN 10; Creatinine 0.83; Hemoglobin 12.5; Platelets 403*; Potassium 3.8; Sodium 135    Lipid Panel     Component Value Date/Time   CHOL 132 10/28/2014 0351   TRIG 74 10/28/2014 0351   HDL 38* 10/28/2014 0351   CHOLHDL 3.5 10/28/2014 0351   VLDL 15 10/28/2014 0351   LDLCALC 79  10/28/2014 0351     Wt Readings from Last 3 Encounters:  11/09/14 115 lb 12.8 oz (52.527 kg)  11/06/14 116 lb (52.617 kg)  11/05/14 116 lb 2.9 oz (52.7 kg)      Other studies Reviewed: Additional studies/ records that were reviewed today include: blood cultures  Review of the above records today demonstrates: NGTD   ASSESSMENT AND PLAN:  1.  Abnormal blood culture Likely contaminant No further workup planned  2. afib with RVR Doing well with diltiazem CD 180mg  BID On eliquis  3. Tachy/brady Will return for LCP interrogation as scheduled in 1 week  4. Tobacco Cessation encouraged again today   Current medicines are reviewed at length with the patient today.   The patient does not have concerns regarding her medicines.  The  following changes were made today:  none  Signed, Hillis Range, MD  11/09/2014 10:49 AM     [facility] [address] [place] [address] [phone] (office) [phone] (fax)

## 2014-11-09 NOTE — Patient Instructions (Signed)
Your physician recommends that you schedule a follow-up appointment as scheduled  

## 2014-11-12 LAB — CULTURE, BLOOD (ROUTINE X 2)
Culture: NO GROWTH
Culture: NO GROWTH

## 2014-11-16 ENCOUNTER — Ambulatory Visit (INDEPENDENT_AMBULATORY_CARE_PROVIDER_SITE_OTHER): Payer: Medicare Other | Admitting: Internal Medicine

## 2014-11-16 ENCOUNTER — Encounter: Payer: Self-pay | Admitting: Internal Medicine

## 2014-11-16 ENCOUNTER — Ambulatory Visit: Payer: Medicare Other | Admitting: Pharmacist

## 2014-11-16 ENCOUNTER — Ambulatory Visit: Payer: Medicare Other

## 2014-11-16 ENCOUNTER — Telehealth: Payer: Self-pay

## 2014-11-16 VITALS — BP 122/82 | HR 71 | Ht 66.0 in | Wt 117.0 lb

## 2014-11-16 DIAGNOSIS — I495 Sick sinus syndrome: Secondary | ICD-10-CM

## 2014-11-16 DIAGNOSIS — I4892 Unspecified atrial flutter: Secondary | ICD-10-CM

## 2014-11-16 LAB — MDC_IDC_ENUM_SESS_TYPE_INCLINIC
Battery Impedance: 430 Ohm
Battery Voltage: 3.3 V
Brady Statistic RV Percent Paced: 0 %
Lead Channel Pacing Threshold Amplitude: 0.5 V
Lead Channel Sensing Intrinsic Amplitude: 4 mV
Lead Channel Setting Pacing Amplitude: 3.5 V
MDC IDC MSMT LEADCHNL RV PACING THRESHOLD PULSEWIDTH: 0.4 ms
MDC IDC SET LEADCHNL RV PACING PULSEWIDTH: 0.4 ms

## 2014-11-16 MED ORDER — NITROGLYCERIN 0.4 MG SL SUBL
0.4000 mg | SUBLINGUAL_TABLET | SUBLINGUAL | Status: DC | PRN
Start: 2014-11-16 — End: 2016-07-24

## 2014-11-16 MED ORDER — LISINOPRIL 10 MG PO TABS: 10.0000 mg | ORAL_TABLET | Freq: Every day | ORAL | Status: DC

## 2014-11-16 MED ORDER — DILTIAZEM HCL ER COATED BEADS 180 MG PO CP24: 180.0000 mg | ORAL_CAPSULE | Freq: Two times a day (BID) | ORAL | Status: DC

## 2014-11-16 NOTE — Patient Instructions (Signed)
Your physician has recommended you make the following change in your medication:   1. Diltiazem 180 mg by mouth twice daily  2. Lisinopril 20 mg take 1/2 tablet to equal (10mg ) by mouth daily.  Your physician recommends that you schedule a follow-up appointment on 12/14/14 with device clinic.  Your physician recommends that you schedule a follow-up appointment in: 3 months with 12/16/14 NP  Your physician wants you to follow-up in: 12 months with Dr. [name]. You will receive a reminder letter in the mail two months in advance. If you don't receive a letter, please call our office to schedule the follow-up appointment.

## 2014-11-16 NOTE — Progress Notes (Signed)
Electrophysiology Office Note   Date:  11/16/2014   ID:  Mercedes [name], DOB 21-Jun-1956, MRN [id number]  PCP:  Rene Paci, MD   Primary Electrophysiologist: Jarold Song   Chief Complaint  Patient presents with  . Follow-up    Tachycardia-bradycardia & AFIB     History of Present Illness: Mercedes [name] is a 59 y.o. female who presents today for electrophysiology evaluation.   She has done well over the last  week.  Her cultures are reviewed and negative. No symptoms of infection/ bacteremia.  Unaware of afib or RVR.  Sometimes feels a little winded moving around at home.  Not smoking since discharge  Today, she denies symptoms of palpitations, chest pain, shortness of breath, orthopnea, PND, lower extremity edema, claudication, dizziness, presyncope, syncope, bleeding, or neurologic sequela. The patient is tolerating medications without difficulties and is otherwise without complaint today.    Past Medical History  Diagnosis Date  . 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 PAD (peripheral artery disease)     s/p L fem pop 11/2013 in setting of 1st toe osteo/gangrene  . GERD (gastroesophageal reflux disease)   . Dizziness     ? CVA 01/2010 - carotid dopplers with no ICA stenosis  . Arnold-Chiari malformation, type I     MRI brain 02/2010  . DEPRESSION   . HYPERLIPIDEMIA   . HYPERTENSION   . CAD (coronary artery disease)     a. NSTEMI 07/2009: LHC - D2 40%, LAD irreg., EF 50% with apical AK (tako-tsubo CM);  b. Inf STEMI (04/2013): Tx Promus DES to CFX;  c. 08/2013 Lexi CL: No ischemia, dist ant/ap/inf-ap infarct, EF   . SLE (systemic lupus erythematosus) dx 05/2013    follows with rheum Dareen Piano)  . Sjogren's syndrome 06/02/13    pt denies this (12/01/13)  . Abnormal LFTs     08/2013 Lipitor 40 d/c'd and crestor 10 started   . Anxiety   . RA (rheumatoid arthritis)     prior tx by Dr.  Dareen Piano  . Anemia     as a young woman  . Tachy-brady syndrome     a. s/p STJ Leadless pacemaker 11-04-2014 Dr Johney Frame  . Paroxysmal atrial fibrillation     a. anticoagulated with Eliquis 10/2014   Past Surgical History  Procedure Laterality Date  . Coronary angioplasty  04/2013  . Tubal ligation    . Colonoscopy    . Femoral-popliteal bypass graft Left 12/02/2013    Procedure:  LEFT FEMORAL-BELOW KNEE POPLITEAL ARTERY BYPASS GRAFT;  Surgeon: Sherren Kerns, MD;  Location: [facility] OR;  Service: Vascular;  Laterality: Left;  . Amputation Left 12/02/2013    Procedure: LEFT 1ST TOE AMPUTATION;  Surgeon: Sherren Kerns, MD;  Location: [facility] OR;  Service: Vascular;  Laterality: Left;  . Left heart catheterization with coronary angiogram N/A 05/19/2013    Procedure: LEFT HEART CATHETERIZATION WITH CORONARY ANGIOGRAM;  Surgeon: Micheline Chapman, MD;  Location: [facility] CATH LAB;  Service: Cardiovascular;  Laterality: N/A;  . Percutaneous coronary stent intervention (pci-s)  05/19/2013    Procedure: PERCUTANEOUS CORONARY STENT INTERVENTION (PCI-S);  Surgeon: Micheline Chapman, MD;  Location: [facility] CATH LAB;  Service: Cardiovascular;;  . Abdominal aortagram N/A 11/24/2013    Procedure: ABDOMINAL AORTAGRAM WITH BILATERAL RUNOFF WITH POSSIBLE INTERVENTION;  Surgeon: Chuck Hint, MD;  Location: [facility] CATH LAB;  Service: Cardiovascular;  Laterality: N/A;  .  Left heart catheterization with coronary angiogram N/A 10/28/2014    Procedure: LEFT HEART CATHETERIZATION WITH CORONARY ANGIOGRAM;  Surgeon: Iran Ouch, MD;  Location: [facility] CATH LAB;  Service: Cardiovascular;  Laterality: N/A;  . Permanent pacemaker insertion N/A 11/04/2014    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     Current Outpatient Prescriptions  Medication Sig Dispense Refill  . apixaban (ELIQUIS) 5 MG TABS tablet Take 1 tablet (5 mg total) by mouth 2 (two) times daily. 60 tablet 1  . diltiazem (CARDIZEM CD) 180 MG 24 hr capsule  Take 1 capsule (180 mg total) by mouth 2 (two) times daily. 60 capsule 11  . lisinopril (PRINIVIL,ZESTRIL) 20 MG tablet Take 0.5 tablets (10 mg total) by mouth daily. 30 tablet 5  . nitroGLYCERIN (NITROSTAT) 0.4 MG SL tablet Place 1 tablet (0.4 mg total) under the tongue every 5 (five) minutes as needed for chest pain (MAX 3 TABLETS). 25 tablet 3  . rosuvastatin (CRESTOR) 10 MG tablet Take 1 tablet (10 mg total) by mouth daily. 30 tablet 1   No current facility-administered medications for this visit.    Allergies:   Brilinta   Social History:  The patient  reports that she quit smoking about 17 months ago. Her smoking use included Cigarettes. She has never used smokeless tobacco. She reports that she drinks alcohol. She reports that she does not use illicit drugs.   Family History:  The patient's  family history includes Arthritis in her father and mother; COPD in her father; Diabetes in her other; Emphysema in her mother.    ROS:  Please see the history of present illness.   All other systems are reviewed and negative.    PHYSICAL EXAM: VS:  BP 122/82 mmHg  Pulse 71  Ht 5\' 6"  (1.676 m)  Wt 117 lb (53.071 kg)  BMI 18.89 kg/m2 , BMI Body mass index is 18.89 kg/(m^2). GEN: Well nourished, well developed, in no acute distress HEENT: normal Neck: no JVD, carotid bruits, or masses Cardiac: RRR; no murmurs, rubs, or gallops,no edema  Respiratory:  clear to auscultation bilaterally, normal work of breathing GI: soft, nontender, nondistended, + BS MS: no deformity or atrophy Skin: warm and dry  Neuro:  Strength and sensation are intact Psych: euthymic mood, full affect   Recent Labs: 10/27/2014: Magnesium 1.8 10/29/2014: TSH 0.571 11/06/2014: ALT 14; BUN 10; Creatinine 0.83; Hemoglobin 12.5; Platelets 403*; Potassium 3.8; Sodium 135  10d ago     Specimen Description BLOOD LEFT FOREARM   Special Requests BOTTLES DRAWN AEROBIC AND ANAEROBIC 10CC   Culture NO GROWTH 5 DAYS  Performed  at [facility]             Lipid Panel     Component Value Date/Time   CHOL 132 10/28/2014 0351   TRIG 74 10/28/2014 0351   HDL 38* 10/28/2014 0351   CHOLHDL 3.5 10/28/2014 0351   VLDL 15 10/28/2014 0351   LDLCALC 79 10/28/2014 0351     Wt Readings from Last 3 Encounters:  11/16/14 117 lb (53.071 kg)  11/09/14 115 lb 12.8 oz (52.527 kg)  11/06/14 116 lb (52.617 kg)        ASSESSMENT AND PLAN:  1. Abnormal blood culture Likely contaminant No further workup planned  2. afib with RVR  diltiazem CD 180 mg, pt is only taking daily, asked to take bid for better heart rate control( pt did not follow previous instructions to take bid) Decrease lisinopril  to 10 mg daily On eliquis  3. Tachy/brady LCP interrogation today shows preserved histogram with only rare elevated V rates Normal device function  4. Tobacco Cessation encouraged   Current medicines are reviewed at length with the patient today.   F/u device clinic in 4 weeks F/u [name], NP, in 3 months and Dr. Johney Frame in 6 months  Signed,  Hillis Range MD  [facility] [address] [place] [address] [phone] (office) [phone] (fax)

## 2014-11-16 NOTE — Telephone Encounter (Signed)
Called patient to go over her medication instructions. Patient is to take Cardizem 180 mg by mouth twice daily and Lisinopril 10 mg by mouth daily. New prescription sent to patient's pharmacy. Patient verbalized understanding. Encouraged patient to call office with any other concerns.

## 2014-11-27 ENCOUNTER — Telehealth: Payer: Self-pay | Admitting: Internal Medicine

## 2014-11-27 NOTE — Telephone Encounter (Signed)
Called daughter back and informed her that unfortunately our medical records staff has left for the day but that I would route this message to medical records to contact her on Monday in regards to [facility] papers. Daughter verbalized understanding and was in agreement with this plan.

## 2014-11-27 NOTE — Telephone Encounter (Signed)
New Message       Pt's daughter calling stating that they dropped off FMLA paperwork for her mom while they were at her appt on 11/09/14 and haven't heard anything back in regards to when they can pick the forms back up. Please call back and advise.

## 2014-11-30 NOTE — Telephone Encounter (Signed)
Mercedes [name] in medical records says [employer] will call her

## 2014-12-01 NOTE — Telephone Encounter (Signed)
[employer] spoke with patient.  She is aware we will call when ready

## 2014-12-07 ENCOUNTER — Telehealth: Payer: Self-pay | Admitting: Internal Medicine

## 2014-12-07 ENCOUNTER — Telehealth: Payer: Self-pay | Admitting: *Deleted

## 2014-12-07 DIAGNOSIS — I4892 Unspecified atrial flutter: Secondary | ICD-10-CM

## 2014-12-07 MED ORDER — DILTIAZEM HCL ER COATED BEADS 120 MG PO CP24: 120.0000 mg | ORAL_CAPSULE | Freq: Two times a day (BID) | ORAL | Status: DC

## 2014-12-07 NOTE — Telephone Encounter (Signed)
2. afib with RVR diltiazem CD 180 mg, pt is only taking daily, asked to take bid for better heart rate control( pt did not follow previous instructions to take bid) Decrease lisinopril to 10 mg daily On eliquis  She says after she takes the medication for about 2 hours she feels sleepy, and "just feels awful"  Says on nothing prior to the hospital

## 2014-12-07 NOTE — Telephone Encounter (Signed)
New Message  Pt had question about medication- certain one that is 180 mg. Pt was wondering if dosage was too high. Please call back and discuss.

## 2014-12-07 NOTE — Telephone Encounter (Signed)
Discussed with Dr Johney Frame.  Will decrease the dose of Diltiazem to 120mg  twice daily and keep follow up for 12/14/14.  If she needs to be seen sooner would have 12/16/14 NP see patient.  Patient aware

## 2014-12-07 NOTE — Telephone Encounter (Signed)
S/w Aleita @ [phone] and patients prior Berkley Harvey is being reviewed will know results in 48-72 hours.

## 2014-12-10 NOTE — Telephone Encounter (Signed)
eliquis approved by [employer] for one year

## 2014-12-14 ENCOUNTER — Ambulatory Visit (INDEPENDENT_AMBULATORY_CARE_PROVIDER_SITE_OTHER): Payer: Medicare Other | Admitting: *Deleted

## 2014-12-14 ENCOUNTER — Encounter: Payer: Self-pay | Admitting: Internal Medicine

## 2014-12-14 DIAGNOSIS — I495 Sick sinus syndrome: Secondary | ICD-10-CM

## 2014-12-14 LAB — MDC_IDC_ENUM_SESS_TYPE_INCLINIC
Lead Channel Impedance Value: 390 Ohm
Lead Channel Pacing Threshold Amplitude: 0.5 V
Lead Channel Sensing Intrinsic Amplitude: 6 mV
Lead Channel Setting Pacing Amplitude: 3.5 V
MDC IDC MSMT BATTERY VOLTAGE: 3.3 V
MDC IDC MSMT LEADCHNL RV PACING THRESHOLD PULSEWIDTH: 0.4 ms
MDC IDC SET LEADCHNL RV PACING PULSEWIDTH: 0.4 ms
MDC IDC SET LEADCHNL RV SENSING SENSITIVITY: 2 mV
MDC IDC STAT BRADY RV PERCENT PACED: 0 %

## 2014-12-14 NOTE — Progress Notes (Signed)
Six week post implant pacemaker check in clinic w/ [employer]. Normal device function. Threshold, sensing, impedances consistent with previous measurements. Device programmed to maximize longevity. No high ventricular rates noted. Device programmed at appropriate safety margins. Histogram distribution appropriate for patient activity level. Device programmed to optimize intrinsic conduction. Estimated longevity >11yrs. ROV w/ AS 01/27/15.

## 2014-12-15 ENCOUNTER — Telehealth: Payer: Self-pay | Admitting: Internal Medicine

## 2014-12-15 NOTE — Telephone Encounter (Signed)
FMLA completed pt aware ready for pick up.

## 2014-12-29 ENCOUNTER — Encounter: Payer: Self-pay | Admitting: Internal Medicine

## 2015-01-04 ENCOUNTER — Encounter: Payer: Self-pay | Admitting: Internal Medicine

## 2015-01-19 ENCOUNTER — Other Ambulatory Visit: Payer: Self-pay | Admitting: Nurse Practitioner

## 2015-01-26 ENCOUNTER — Other Ambulatory Visit: Payer: Self-pay | Admitting: Nurse Practitioner

## 2015-01-27 ENCOUNTER — Encounter: Payer: Self-pay | Admitting: Nurse Practitioner

## 2015-01-27 ENCOUNTER — Ambulatory Visit (INDEPENDENT_AMBULATORY_CARE_PROVIDER_SITE_OTHER): Payer: Medicare Other | Admitting: Nurse Practitioner

## 2015-01-27 VITALS — BP 154/80 | HR 81 | Ht 66.0 in

## 2015-01-27 DIAGNOSIS — Z7901 Long term (current) use of anticoagulants: Secondary | ICD-10-CM | POA: Diagnosis not present

## 2015-01-27 DIAGNOSIS — I480 Paroxysmal atrial fibrillation: Secondary | ICD-10-CM | POA: Diagnosis not present

## 2015-01-27 DIAGNOSIS — E785 Hyperlipidemia, unspecified: Secondary | ICD-10-CM | POA: Diagnosis not present

## 2015-01-27 DIAGNOSIS — I495 Sick sinus syndrome: Secondary | ICD-10-CM

## 2015-01-27 DIAGNOSIS — Z720 Tobacco use: Secondary | ICD-10-CM

## 2015-01-27 DIAGNOSIS — I10 Essential (primary) hypertension: Secondary | ICD-10-CM | POA: Diagnosis not present

## 2015-01-27 MED ORDER — LISINOPRIL 5 MG PO TABS: 5.0000 mg | ORAL_TABLET | Freq: Every day | ORAL | Status: DC

## 2015-01-27 MED ORDER — ASPIRIN EC 81 MG PO TBEC: 81.0000 mg | DELAYED_RELEASE_TABLET | Freq: Every day | ORAL | Status: DC

## 2015-01-27 NOTE — Patient Instructions (Signed)
Medication Instructions:  Your physician has recommended you make the following change in your medication:  START Aspirin 81mg  daily REDUCE Lisinopril to 5mg  daily   Labwork: Your physician recommends that you return for a FASTING lipid profile,lft,bmet,cbc asap   Testing/Procedures: None   Follow-Up: Your physician recommends that you schedule a follow-up appointment in: 3 months with Dr.Allred  Your physician wants you to follow-up in: 6 months with Dr.Crenshaw You will receive a reminder letter in the mail two months in advance. If you don't receive a letter, please call our office to schedule the follow-up appointment.    Any Other Special Instructions Will Be Listed Below (If Applicable).

## 2015-01-27 NOTE — Progress Notes (Signed)
Electrophysiology Office Note Date: 01/27/2015  ID:  Mercedes [name], DOB Feb 28, 1956, MRN [id number]  PCP: Rene Paci, MD  Primary Cardiologist: Jens Som Electrophysiologist: Allred   CC: Pacemaker follow-up  Mercedes [name] is a 59 y.o. female is seen today for Dr Johney Frame.  She underwent leadless pacemaker implantation in March of this year for tachy/brady syndrome. Her other past medical history includes Takotsubo cardiomyopathy with improved EF, hyperlipidemia, hypertension, CAD, and lupus/RA.  She presents today for routine electrophysiology followup.  Since last being seen in our clinic, the patient reports doing very well.  She denies chest pain, palpitations, dyspnea, PND, orthopnea, nausea, vomiting, dizziness, syncope.  She is largely unaware of her AF but does have occasional tachy-palpitations.   Device History: STJ leadless PPM implanted 10/2014 for tachy/brady syndrome   Past Medical History  Diagnosis Date  . 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 PAD (peripheral artery disease)     s/p L fem pop 11/2013 in setting of 1st toe osteo/gangrene  . GERD (gastroesophageal reflux disease)   . Dizziness     ? CVA 01/2010 - carotid dopplers with no ICA stenosis  . Arnold-Chiari malformation, type I     MRI brain 02/2010  . DEPRESSION   . HYPERLIPIDEMIA   . HYPERTENSION   . CAD (coronary artery disease)     a. NSTEMI 07/2009: LHC - D2 40%, LAD irreg., EF 50% with apical AK (tako-tsubo CM);  b. Inf STEMI (04/2013): Tx Promus DES to CFX;  c. 08/2013 Lexi CL: No ischemia, dist ant/ap/inf-ap infarct, EF   . SLE (systemic lupus erythematosus) dx 05/2013    follows with rheum Dareen Piano)  . Sjogren's syndrome 06/02/13    pt denies this (12/01/13)  . Abnormal LFTs     08/2013 Lipitor 40 d/c'd and crestor 10 started   . Anxiety   . RA (rheumatoid arthritis)     prior tx by Dr. Dareen Piano  . Anemia     as a  young woman  . Tachy-brady syndrome     a. s/p STJ Leadless pacemaker 11-04-2014 Dr Johney Frame  . Paroxysmal atrial fibrillation     a. anticoagulated with Eliquis 10/2014   Past Surgical History  Procedure Laterality Date  . Coronary angioplasty  04/2013  . Tubal ligation    . Colonoscopy    . Femoral-popliteal bypass graft Left 12/02/2013    Procedure:  LEFT FEMORAL-BELOW KNEE POPLITEAL ARTERY BYPASS GRAFT;  Surgeon: Sherren Kerns, MD;  Location: [facility] OR;  Service: Vascular;  Laterality: Left;  . Amputation Left 12/02/2013    Procedure: LEFT 1ST TOE AMPUTATION;  Surgeon: Sherren Kerns, MD;  Location: [facility] OR;  Service: Vascular;  Laterality: Left;  . Left heart catheterization with coronary angiogram N/A 05/19/2013    Procedure: LEFT HEART CATHETERIZATION WITH CORONARY ANGIOGRAM;  Surgeon: Micheline Chapman, MD;  Location: [facility] CATH LAB;  Service: Cardiovascular;  Laterality: N/A;  . Percutaneous coronary stent intervention (pci-s)  05/19/2013    Procedure: PERCUTANEOUS CORONARY STENT INTERVENTION (PCI-S);  Surgeon: Micheline Chapman, MD;  Location: [facility] CATH LAB;  Service: Cardiovascular;;  . Abdominal aortagram N/A 11/24/2013    Procedure: ABDOMINAL AORTAGRAM WITH BILATERAL RUNOFF WITH POSSIBLE INTERVENTION;  Surgeon: Chuck Hint, MD;  Location: [facility] CATH LAB;  Service: Cardiovascular;  Laterality: N/A;  . Left heart catheterization with coronary angiogram N/A 10/28/2014    Procedure: LEFT HEART CATHETERIZATION  WITH CORONARY ANGIOGRAM;  Surgeon: Iran Ouch, MD;  Location: [facility] CATH LAB;  Service: Cardiovascular;  Laterality: N/A;  . Permanent pacemaker insertion N/A 11/04/2014    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    Current Outpatient Prescriptions  Medication Sig Dispense Refill  . diltiazem (CARDIZEM CD) 120 MG 24 hr capsule Take 1 capsule (120 mg total) by mouth 2 (two) times daily. 180 capsule 3  . ELIQUIS 5 MG TABS tablet TAKE 1 TABLET BY MOUTH TWICE DAILY 60  tablet 3  . lisinopril (PRINIVIL,ZESTRIL) 5 MG tablet Take 1 tablet (5 mg total) by mouth daily.    . nitroGLYCERIN (NITROSTAT) 0.4 MG SL tablet Place 1 tablet (0.4 mg total) under the tongue every 5 (five) minutes as needed for chest pain (MAX 3 TABLETS). 25 tablet 3  . rosuvastatin (CRESTOR) 10 MG tablet TAKE 1 TABLET BY MOUTH DAILY 30 tablet 5  . aspirin EC 81 MG tablet Take 1 tablet (81 mg total) by mouth daily.     No current facility-administered medications for this visit.    Allergies:   Brilinta   Social History: History   Social History  . Marital Status: Single    Spouse Name: N/A  . Number of Children: 3  . Years of Education: N/A   Occupational History  . Not on file.   Social History Main Topics  . Smoking status: Former Smoker    Types: Cigarettes    Quit date: 05/19/2013  . Smokeless tobacco: Never Used     Comment:    . Alcohol Use: 0.0 oz/week    0 Standard drinks or equivalent per week  . Drug Use: No  . Sexual Activity: Not on file   Other Topics Concern  . Not on file   Social History Narrative   Lives alone.    Family History: Family History  Problem Relation Age of Onset  . Arthritis Mother   . Emphysema Mother   . Arthritis Father   . COPD Father   . Diabetes Other      Review of Systems: All other systems reviewed and are otherwise negative except as noted above.   Physical Exam: VS:  BP 154/80 mmHg  Pulse 81  Ht 5\' 6"  (1.676 m) , BMI There is no weight on file to calculate BMI.  GEN- The patient is well and thin appearing, alert and oriented x 3 today.   HEENT: normocephalic, atraumatic; sclera clear, conjunctiva pink; hearing intact; oropharynx clear; neck supple, no JVP Lymph- no cervical lymphadenopathy Lungs- Clear to ausculation bilaterally, normal work of breathing.  No wheezes, rales, rhonchi Heart- Regular rate and rhythm, no murmurs, rubs or gallops GI- soft, non-tender, non-distended, bowel sounds  present Extremities- no clubbing, cyanosis, or edema; DP/PT/radial pulses 2+ bilaterally MS- no significant deformity or atrophy Skin- warm and dry, no rash or lesion Psych- euthymic mood, full affect Neuro- strength and sensation are intact  PPM Interrogation- reviewed in detail today,  See PACEART report  EKG:  EKG is not ordered today.  Recent Labs: 10/27/2014: Magnesium 1.8 10/29/2014: TSH 0.571 11/06/2014: ALT 14; BUN 10; Creatinine 0.83; Hemoglobin 12.5; Platelets 403*; Potassium 3.8; Sodium 135   Wt Readings from Last 3 Encounters:  11/16/14 117 lb (53.071 kg)  11/09/14 115 lb 12.8 oz (52.527 kg)  11/06/14 116 lb (52.617 kg)     Other studies Reviewed: Additional studies/ records that were reviewed today include: hospital records, cath report, Dr Jenel Lucks office note  Assessment and Plan:  1.  Tachy/brady syndrome Normal leadless PPM function See Pace Art report No changes today  2.  Paroxysmal atrial fibrillation Continue Eliquis for CHADS2VASC score of at least 4 Continue Diltiazem for rate control CBC, BMET today  3.  CAD Catheterization 10/2014 demonstrated widely patent left circ stent, 30% ostial LAD stenosis, normal LV function, and normal LVEDP Continue ASA 81mg  daily as well as Eliquis per Dr cath note  4.  Tobacco abuse Cessation advised  5.  HTN Blood pressures at home have been running 120's systolic with some orthostatic intolerance. Will decrease Lisinopril to 5mg  daily  Current medicines are reviewed at length with the patient today.   The patient does not have concerns regarding her medicines.  The following changes were made today:  Start ASA 81mg  daily, decrease Lisinopril to 5mg  daily  Labs/ tests ordered today include: CBC, BMET, lipids  Disposition:   Follow up with Dr Jari Sportsman in 3 months, follow up with Dr in 6 months     Signed, , NP 01/27/2015 4:14 PM  [facility] [address] [place] 03/29/2015 [facility] [phone] (office) [phone] (fax)

## 2015-01-29 ENCOUNTER — Encounter: Payer: Self-pay | Admitting: Nurse Practitioner

## 2015-02-01 ENCOUNTER — Other Ambulatory Visit (INDEPENDENT_AMBULATORY_CARE_PROVIDER_SITE_OTHER): Payer: Medicare Other | Admitting: *Deleted

## 2015-02-01 DIAGNOSIS — Z7901 Long term (current) use of anticoagulants: Secondary | ICD-10-CM

## 2015-02-01 DIAGNOSIS — E785 Hyperlipidemia, unspecified: Secondary | ICD-10-CM | POA: Diagnosis not present

## 2015-02-01 LAB — LIPID PANEL
Cholesterol: 158 mg/dL (ref 0–200)
HDL: 62.4 mg/dL (ref >39.00)
LDL Cholesterol: 83 mg/dL (ref 0–99)
NonHDL: 95.6
TRIGLYCERIDES: 63 mg/dL (ref 0.0–149.0)
Total CHOL/HDL Ratio: 3
VLDL: 12.6 mg/dL (ref 0.0–40.0)

## 2015-02-01 LAB — BASIC METABOLIC PANEL
BUN: 14 mg/dL (ref 6–23)
CO2: 27 meq/L (ref 19–32)
CREATININE: 0.96 mg/dL (ref 0.40–1.20)
Calcium: 9.5 mg/dL (ref 8.4–10.5)
Chloride: 103 mEq/L (ref 96–112)
GFR: 75.98 mL/min (ref >60.00)
GLUCOSE: 114 mg/dL — AB (ref 70–99)
Potassium: 3.9 mEq/L (ref 3.5–5.1)
Sodium: 135 mEq/L (ref 135–145)

## 2015-02-01 LAB — CBC WITH DIFFERENTIAL/PLATELET
BASOS PCT: 1 % (ref 0.0–3.0)
Basophils Absolute: 0.1 10*3/uL (ref 0.0–0.1)
EOS ABS: 0.2 10*3/uL (ref 0.0–0.7)
EOS PCT: 4.1 % (ref 0.0–5.0)
HCT: 41.3 % (ref 36.0–46.0)
HEMOGLOBIN: 13.5 g/dL (ref 12.0–15.0)
LYMPHS ABS: 2.2 10*3/uL (ref 0.7–4.0)
Lymphocytes Relative: 42 % (ref 12.0–46.0)
MCHC: 32.6 g/dL (ref 30.0–36.0)
MCV: 77.3 fl — AB (ref 78.0–100.0)
MONO ABS: 0.6 10*3/uL (ref 0.1–1.0)
Monocytes Relative: 12.2 % — ABNORMAL HIGH (ref 3.0–12.0)
NEUTROS ABS: 2.1 10*3/uL (ref 1.4–7.7)
NEUTROS PCT: 40.7 % — AB (ref 43.0–77.0)
Platelets: 317 10*3/uL (ref 150.0–400.0)
RBC: 5.34 Mil/uL — AB (ref 3.87–5.11)
RDW: 17.2 % — AB (ref 11.5–15.5)
WBC: 5.2 10*3/uL (ref 4.0–10.5)

## 2015-02-01 LAB — HEPATIC FUNCTION PANEL
ALBUMIN: 3.8 g/dL (ref 3.5–5.2)
ALT: 15 U/L (ref 0–35)
AST: 20 U/L (ref 0–37)
Alkaline Phosphatase: 90 U/L (ref 39–117)
BILIRUBIN TOTAL: 0.5 mg/dL (ref 0.2–1.2)
Bilirubin, Direct: 0.1 mg/dL (ref 0.0–0.3)
TOTAL PROTEIN: 7.8 g/dL (ref 6.0–8.3)

## 2015-02-03 ENCOUNTER — Other Ambulatory Visit: Payer: Medicare Other

## 2015-02-11 ENCOUNTER — Encounter: Payer: Self-pay | Admitting: Internal Medicine

## 2015-02-18 NOTE — Addendum Note (Signed)
Addended by: Drue Dun on: 02/18/2015 02:57 PM   Modules accepted: Orders

## 2015-03-17 ENCOUNTER — Ambulatory Visit: Payer: Medicare Other | Admitting: Internal Medicine

## 2015-03-22 ENCOUNTER — Ambulatory Visit (INDEPENDENT_AMBULATORY_CARE_PROVIDER_SITE_OTHER): Payer: Medicare Other | Admitting: Family

## 2015-03-22 ENCOUNTER — Encounter: Payer: Self-pay | Admitting: Family

## 2015-03-22 VITALS — BP 142/78 | HR 63 | Temp 98.3°F | Resp 18 | Ht 66.0 in | Wt 122.8 lb

## 2015-03-22 DIAGNOSIS — E785 Hyperlipidemia, unspecified: Secondary | ICD-10-CM

## 2015-03-22 DIAGNOSIS — I10 Essential (primary) hypertension: Secondary | ICD-10-CM | POA: Diagnosis not present

## 2015-03-22 NOTE — Progress Notes (Signed)
Subjective:    Patient ID: Mercedes [name], female    DOB: 09-16-1955, 59 y.o.   MRN: [id number]  Chief Complaint  Patient presents with  . Follow-up    wants to talk about BP and cholesterol, also wants to go over medications    HPI:  Mercedes [name] is a 59 y.o. female with a PMH of hypertension, coronary artery disease, cardiomyopathy, peripheral vascular disease, atherosclerosis, GERD, lupus, seizures and depression who presents today for an office follow-up.   1.) Hypertension - currently managed with diltiazem and lisinopril. Currently takes the medication as prescribed and denies adverse side effects. Has several questions regarding her medications and if they are needed.   BP Readings from Last 3 Encounters:  03/22/15 142/78  01/27/15 154/80  11/16/14 122/82   2.) Medication review - Patient would like to review her medications to determine if there is anything that can be discontinued.   Allergies  Allergen Reactions  . Brilinta [Ticagrelor] Shortness Of Breath    Also chest tightness    Current Outpatient Prescriptions on File Prior to Visit  Medication Sig Dispense Refill  . aspirin EC 81 MG tablet Take 1 tablet (81 mg total) by mouth daily.    [name] diltiazem (CARDIZEM CD) 120 MG 24 hr capsule Take 1 capsule (120 mg total) by mouth 2 (two) times daily. 180 capsule 3  . ELIQUIS 5 MG TABS tablet TAKE 1 TABLET BY MOUTH TWICE DAILY 60 tablet 3  . lisinopril (PRINIVIL,ZESTRIL) 5 MG tablet Take 1 tablet (5 mg total) by mouth daily.    . nitroGLYCERIN (NITROSTAT) 0.4 MG SL tablet Place 1 tablet (0.4 mg total) under the tongue every 5 (five) minutes as needed for chest pain (MAX 3 TABLETS). 25 tablet 3  . rosuvastatin (CRESTOR) 10 MG tablet TAKE 1 TABLET BY MOUTH DAILY 30 tablet 5   No current facility-administered medications on file prior to visit.    Review of Systems  Constitutional: Negative for fever and chills.  Eyes:       Negative for changes in eyesight.     Respiratory: Negative for chest tightness.   Cardiovascular: Negative for chest pain, palpitations and leg swelling.  Neurological: Negative for headaches.      Objective:    BP 142/78 mmHg  Pulse 63  Temp(Src) 98.3 F (36.8 C) (Oral)  Resp 18  Ht 5\' 6"  (1.676 m)  Wt 122 lb 12.8 oz (55.702 kg)  BMI 19.83 kg/m2  SpO2 97% Nursing note and vital signs reviewed.  Physical Exam  Constitutional: She is oriented to person, place, and time. She appears well-developed and well-nourished. No distress.  Cardiovascular: Normal rate, regular rhythm, normal heart sounds and intact distal pulses.   Pulmonary/Chest: Effort normal and breath sounds normal.  Neurological: She is alert and oriented to person, place, and time.  Skin: Skin is warm and dry.  Psychiatric: She has a normal mood and affect. Her behavior is normal. Judgment and thought content normal.       Assessment & Plan:   Problem List Items Addressed This Visit      Cardiovascular and Mediastinum   Essential hypertension (Chronic)    Blood pressure is slightly elevated above goal with current regimen. Discussed indications, importance, and risks/adverse side effects of each of the medications. Continue current dosage of diltiazem for rate control and to assist with blood pressure control. Continue current dosage of lisinopril for cardiac and renal protection. Discuss potential reduction of medications with cardiology  given significant cardiac history.         Other   Dyslipidemia - Primary (Chronic)    Stable with no evidence of myalgias or other side effects. Discussed risks and benefits of current medication on heart disease risk. Continue current dosage of rosuvastatin. Recent lab work shows cholesterol is stable with current regimen.

## 2015-03-22 NOTE — Assessment & Plan Note (Signed)
Stable with no evidence of myalgias or other side effects. Discussed risks and benefits of current medication on heart disease risk. Continue current dosage of rosuvastatin. Recent lab work shows cholesterol is stable with current regimen.

## 2015-03-22 NOTE — Assessment & Plan Note (Signed)
Blood pressure is slightly elevated above goal with current regimen. Discussed indications, importance, and risks/adverse side effects of each of the medications. Continue current dosage of diltiazem for rate control and to assist with blood pressure control. Continue current dosage of lisinopril for cardiac and renal protection. Discuss potential reduction of medications with cardiology given significant cardiac history.

## 2015-03-22 NOTE — Patient Instructions (Addendum)
Thank you for choosing [facility].  Summary/Instructions:  Please continue to take your medication as prescribed.  Please stop by the lab on the basement level of the building for your blood work. Your results will be released to MyChart (or called to you) after review, usually within 72 hours after test completion. If any changes need to be made, you will be notified at that same time.  If your symptoms worsen or fail to improve, please contact our office for further instruction, or in case of emergency go directly to the emergency room at the closest medical facility.     

## 2015-03-22 NOTE — Progress Notes (Signed)
Pre visit review using our clinic review tool, if applicable. No additional management support is needed unless otherwise documented below in the visit note. 

## 2015-04-28 ENCOUNTER — Encounter: Payer: Self-pay | Admitting: Family

## 2015-04-29 ENCOUNTER — Ambulatory Visit (INDEPENDENT_AMBULATORY_CARE_PROVIDER_SITE_OTHER): Payer: Medicare Other | Admitting: Family

## 2015-04-29 ENCOUNTER — Encounter: Payer: Self-pay | Admitting: Family

## 2015-04-29 ENCOUNTER — Ambulatory Visit (INDEPENDENT_AMBULATORY_CARE_PROVIDER_SITE_OTHER)
Admission: RE | Admit: 2015-04-29 | Discharge: 2015-04-29 | Disposition: A | Discharge status: Home / Self Care | Payer: Medicare Other | Source: Ambulatory Visit | Attending: Family | Admitting: Family

## 2015-04-29 ENCOUNTER — Other Ambulatory Visit: Payer: Self-pay | Admitting: Vascular Surgery

## 2015-04-29 ENCOUNTER — Ambulatory Visit (HOSPITAL_COMMUNITY)
Admission: RE | Admit: 2015-04-29 | Discharge: 2015-04-29 | Disposition: A | Discharge status: Home / Self Care | Payer: Medicare Other | Source: Ambulatory Visit | Attending: Family | Admitting: Family

## 2015-04-29 VITALS — BP 124/87 | HR 66 | Temp 98.3°F | Resp 16 | Ht 66.0 in | Wt 120.0 lb

## 2015-04-29 DIAGNOSIS — E785 Hyperlipidemia, unspecified: Secondary | ICD-10-CM | POA: Diagnosis not present

## 2015-04-29 DIAGNOSIS — I10 Essential (primary) hypertension: Secondary | ICD-10-CM | POA: Insufficient documentation

## 2015-04-29 DIAGNOSIS — I739 Peripheral vascular disease, unspecified: Secondary | ICD-10-CM

## 2015-04-29 DIAGNOSIS — Z95828 Presence of other vascular implants and grafts: Secondary | ICD-10-CM

## 2015-04-29 DIAGNOSIS — Z87891 Personal history of nicotine dependence: Secondary | ICD-10-CM

## 2015-04-29 DIAGNOSIS — G546 Phantom limb syndrome with pain: Secondary | ICD-10-CM | POA: Diagnosis not present

## 2015-04-29 DIAGNOSIS — Z48812 Encounter for surgical aftercare following surgery on the circulatory system: Secondary | ICD-10-CM | POA: Diagnosis not present

## 2015-04-29 DIAGNOSIS — Z9889 Other specified postprocedural states: Secondary | ICD-10-CM

## 2015-04-29 MED ORDER — GABAPENTIN 100 MG PO CAPS: ORAL_CAPSULE | ORAL | Status: DC

## 2015-04-29 NOTE — Progress Notes (Signed)
[facility] [place] HISTORY AND PHYSICAL -PAD  History of Present Illness Mercedes [name] is a 59 y.o. female patient of Dr. Darrick Penna who is s/p left femoral to below-knee popliteal bypass and amputation of her left first toe on December 02, 2013. She had one vessel peroneal runoff. This was a non-reversed vein graft. She returns for followup today. The rest pain in her left foot has resolved. She has intermittent phantom pain in the left first toe amputation; this started early in 2016. She has right calf claudication after walking about 100-200 feet, relieved by rest. Pt states her blood pressure at home is about 115/74.   Pt denies any known history of stroke or TIA, does report 2 MI's. The patient reports New Medical or Surgical History:leadless pacemaker inserted March 2016 for tachy/brady.  She is walking a great deal more.  Pt Diabetic: No Pt smoker: former smoker, quit in 2015  Pt meds include: Statin : yes ASA: Yes Other anticoagulants/antiplatelets: Eliquis    Past Medical History  Diagnosis Date  . 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 PAD (peripheral artery disease)     s/p L fem pop 11/2013 in setting of 1st toe osteo/gangrene  . GERD (gastroesophageal reflux disease)   . Dizziness     ? CVA 01/2010 - carotid dopplers with no ICA stenosis  . Arnold-Chiari malformation, type I     MRI brain 02/2010  . DEPRESSION   . HYPERLIPIDEMIA   . HYPERTENSION   . CAD (coronary artery disease)     a. NSTEMI 07/2009: LHC - D2 40%, LAD irreg., EF 50% with apical AK (tako-tsubo CM);  b. Inf STEMI (04/2013): Tx Promus DES to CFX;  c. 08/2013 Lexi CL: No ischemia, dist ant/ap/inf-ap infarct, EF   . SLE (systemic lupus erythematosus) dx 05/2013    follows with rheum Dareen Piano)  . Sjogren's syndrome 06/02/13    pt denies this (12/01/13)  . Abnormal LFTs     08/2013 Lipitor 40 d/c'd and crestor 10  started   . Anxiety   . RA (rheumatoid arthritis)     prior tx by Dr. Dareen Piano  . Anemia     as a young woman  . Tachy-brady syndrome     a. s/p STJ Leadless pacemaker 11-04-2014 Dr Johney Frame  . Paroxysmal atrial fibrillation     a. anticoagulated with Eliquis 10/2014    Social History Social History  Substance Use Topics  . Smoking status: Former Smoker    Types: Cigarettes    Quit date: 05/19/2013  . Smokeless tobacco: Never Used     Comment:    . Alcohol Use: 0.0 oz/week    0 Standard drinks or equivalent per week    Family History Family History  Problem Relation Age of Onset  . Arthritis Mother   . Emphysema Mother   . Arthritis Father   . COPD Father   . Diabetes Other     Past Surgical History  Procedure Laterality Date  . Coronary angioplasty  04/2013  . Tubal ligation    . Colonoscopy    . Femoral-popliteal bypass graft Left 12/02/2013    Procedure:  LEFT FEMORAL-BELOW KNEE POPLITEAL ARTERY BYPASS GRAFT;  Surgeon: Sherren Kerns, MD;  Location: [facility] OR;  Service: Vascular;  Laterality: Left;  . Amputation Left 12/02/2013    Procedure: LEFT 1ST TOE AMPUTATION;  Surgeon: Sherren Kerns, MD;  Location: [facility] OR;  Service: Vascular;  Laterality: Left;  . Left heart catheterization with coronary angiogram N/A 05/19/2013    Procedure: LEFT HEART CATHETERIZATION WITH CORONARY ANGIOGRAM;  Surgeon: Micheline Chapman, MD;  Location: [facility] CATH LAB;  Service: Cardiovascular;  Laterality: N/A;  . Percutaneous coronary stent intervention (pci-s)  05/19/2013    Procedure: PERCUTANEOUS CORONARY STENT INTERVENTION (PCI-S);  Surgeon: Micheline Chapman, MD;  Location: [facility] CATH LAB;  Service: Cardiovascular;;  . Abdominal aortagram N/A 11/24/2013    Procedure: ABDOMINAL AORTAGRAM WITH BILATERAL RUNOFF WITH POSSIBLE INTERVENTION;  Surgeon: Chuck Hint, MD;  Location: [facility] CATH LAB;  Service: Cardiovascular;  Laterality: N/A;  . Left heart catheterization with coronary angiogram N/A 10/28/2014     Procedure: LEFT HEART CATHETERIZATION WITH CORONARY ANGIOGRAM;  Surgeon: Iran Ouch, MD;  Location: [facility] CATH LAB;  Service: Cardiovascular;  Laterality: N/A;  . Permanent pacemaker insertion N/A 11/04/2014    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    Allergies  Allergen Reactions  . Brilinta [Ticagrelor] Shortness Of Breath    Also chest tightness    Current Outpatient Prescriptions  Medication Sig Dispense Refill  . aspirin EC 81 MG tablet Take 1 tablet (81 mg total) by mouth daily.    [name] diltiazem (CARDIZEM CD) 120 MG 24 hr capsule Take 1 capsule (120 mg total) by mouth 2 (two) times daily. 180 capsule 3  . ELIQUIS 5 MG TABS tablet TAKE 1 TABLET BY MOUTH TWICE DAILY 60 tablet 3  . lisinopril (PRINIVIL,ZESTRIL) 5 MG tablet Take 1 tablet (5 mg total) by mouth daily.    . nitroGLYCERIN (NITROSTAT) 0.4 MG SL tablet Place 1 tablet (0.4 mg total) under the tongue every 5 (five) minutes as needed for chest pain (MAX 3 TABLETS). 25 tablet 3  . rosuvastatin (CRESTOR) 10 MG tablet TAKE 1 TABLET BY MOUTH DAILY 30 tablet 5   No current facility-administered medications for this visit.    ROS: See HPI for pertinent positives and negatives.   Physical Examination  Filed Vitals:   04/29/15 1534  BP: 131/93  Pulse: 69  Temp: 98.3 F (36.8 C)  TempSrc: Oral  Resp: 16  Height: 5\' 6"  (1.676 m)  Weight: 120 lb (54.432 kg)  SpO2: 100%   Body mass index is 19.38 kg/(m^2).  General: A&O x 3, WDWN. Gait: normal Eyes: PERRLA. Pulmonary: CTAB, without wheezes , rales or rhonchi. Cardiac: regular Rythm , without detected murmur.     Carotid Bruits Right Left   Negative Negative  Aorta is not palpable. Radial pulses: are 2+ palpable And =   VASCULAR EXAM: Extremities without ischemic changes  without Gangrene; without open wounds. Left great toe is surgically absent.      LE Pulses Right Left   FEMORAL  palpable  palpable    POPLITEAL not palpable  not palpable   POSTERIOR TIBIAL not palpable  not palpable    DORSALIS PEDIS  ANTERIOR TIBIAL not palpable  faintly palpable    Abdomen: soft, NT, no palpable masses. Skin: no rashes, no ulcers. Musculoskeletal: no muscle wasting or atrophy. Neurologic: A&O X 3; Appropriate Affect ; SENSATION: normal; MOTOR FUNCTION: moving all extremities equally, motor strength 5/5 throughout. Speech is fluent/normal. CN 2-12 intact.           Non-Invasive Vascular Imaging: DATE: 04/29/2015 LOWER EXTREMITY ARTERIAL DUPLEX EVALUATION    INDICATION: Peripheral vascular disease     PREVIOUS INTERVENTION(S): Left femoral to below knee popliteal bypass  graft on 12/02/2013.    DUPLEX EXAM:     RIGHT  LEFT   Peak Systolic Velocity (cm/s) Ratio (if abnormal) Waveform  Peak Systolic Velocity (cm/s) Ratio (if abnormal) Waveform     Inflow Artery 126  T      Proximal Anastomosis 102  B     Proximal Graft 35  B     Mid Graft 30  B      Distal Graft 37  B     Distal Anastomosis 47  B     Outflow Artery 66  B   0.77 Today's ABI / TBI 0.89  0.61 Previous ABI / TBI (10/23/2014  ) 0.94    Waveform:    M - Monophasic       B - Biphasic       T - Triphasic  If Ankle Brachial Index (ABI) or Toe Brachial Index (TBI) performed, please see complete report     ADDITIONAL FINDINGS: No internal vessel narrowing noted within the bypass graft or anastomosis.    IMPRESSION: Patent left lower extremity bypass graft with no evidence of restenosis.     Compared to the previous exam:  No significant change in comparison to the last exam on 10/23/2014.     ASSESSMENT: Mercedes [name] is a 59 y.o. female who is s/p left femoral to below-knee popliteal bypass and amputation of her  left first toe on December 02 2013. She had one vessel peroneal runoff. This was a non-reversed vein graft. She returns for followup today. The rest pain in her left foot has resolved. She has phantom pain in the left first toe amputation site which started earlier this year. She has right calf claudication after walking about 100-200 feet, relieved by rest. She has no tissue loss.  She is walking more and feeling well. Today's left LE arterial Duplex suggests a patent left lower extremity bypass graft with no evidence of restenosis. No significant change in comparison to the last exam on 10/23/2014.  The right LE ABI has improved and the left LE ABI remains stable, all monophasic waveforms.   PLAN:  For phantom pain pt requests a medication to help at bedtime: gabapentin 100 mg at HS prn, may increase to 200 mg, then 300 mg if more relief is needed, disp #40, 2 refills. Continue graduated walking program. Based on the patient's vascular studies and examination, pt will return to clinic in 6 months with ABI's and left LE arterial Duplex.   I discussed in depth with the patient the nature of atherosclerosis, and emphasized the importance of maximal medical management including strict control of blood pressure, blood glucose, and lipid levels, obtaining regular exercise, and continued cessation of smoking.  The patient is aware that without maximal medical management the underlying atherosclerotic disease process will progress, limiting the benefit of any interventions.  The patient was given information about PAD including signs, symptoms, treatment, what symptoms should prompt the patient to seek immediate medical care, and risk reduction measures to take.  Charisse March, RN, MSN, FNP-C [facility] [place] Phone: [phone]  Clinic MD: Darrick Penna  04/29/2015 3:43 PM

## 2015-04-29 NOTE — Progress Notes (Signed)
Filed Vitals:   04/29/15 1534 04/29/15 1544  BP: 131/93 124/87  Pulse: 69 66  Temp: 98.3 F (36.8 C)   TempSrc: Oral   Resp: 16   Height: 5\' 6"  (1.676 m)   Weight: 120 lb (54.432 kg)   SpO2: 100%

## 2015-04-29 NOTE — Patient Instructions (Signed)

## 2015-05-05 ENCOUNTER — Encounter: Payer: Self-pay | Admitting: Internal Medicine

## 2015-05-05 ENCOUNTER — Ambulatory Visit (INDEPENDENT_AMBULATORY_CARE_PROVIDER_SITE_OTHER): Payer: Medicare Other | Admitting: Internal Medicine

## 2015-05-05 VITALS — BP 158/88 | HR 71 | Ht 66.0 in | Wt 122.8 lb

## 2015-05-05 DIAGNOSIS — I480 Paroxysmal atrial fibrillation: Secondary | ICD-10-CM

## 2015-05-05 DIAGNOSIS — I495 Sick sinus syndrome: Secondary | ICD-10-CM

## 2015-05-05 DIAGNOSIS — I10 Essential (primary) hypertension: Secondary | ICD-10-CM

## 2015-05-05 DIAGNOSIS — I429 Cardiomyopathy, unspecified: Secondary | ICD-10-CM

## 2015-05-05 DIAGNOSIS — Z9861 Coronary angioplasty status: Secondary | ICD-10-CM

## 2015-05-05 DIAGNOSIS — I2510 Atherosclerotic heart disease of native coronary artery without angina pectoris: Secondary | ICD-10-CM | POA: Diagnosis not present

## 2015-05-05 MED ORDER — METOPROLOL SUCCINATE ER 25 MG PO TB24: 25.0000 mg | ORAL_TABLET | Freq: Every day | ORAL | Status: DC

## 2015-05-05 NOTE — Patient Instructions (Signed)
Medication Instructions:  Your physician has recommended you make the following change in your medication:  1) Stop Diltiazem 2) Start Metoprolol 25mg  daily   Labwork: None ordered  Testing/Procedures: None ordered  Follow-Up:  Your physician recommends that you schedule a follow-up appointment next available with Dr  Your physician wants you to follow-up in: 6 months Jens Som, EP PA and 12 months with Dr Francis Dowse will receive a reminder letter in the mail two months in advance. If you don't receive a letter, please call our office to schedule the follow-up appointment.   Any Other Special Instructions Will Be Listed Below (If Applicable).

## 2015-05-05 NOTE — Progress Notes (Signed)
Electrophysiology Office Note   Date:  05/05/2015   ID:  Mercedes [name], DOB [birth year], MRN [id number]  PCP:  Rene Paci, MD  Cardiologist: Dr. Jens Som Primary Electrophysiologist: Dr. Johney Frame    Chief Complaint  Patient presents with  . PAF  . Tachycardia-Bradycardia syndrome     History of Present Illness: Mercedes [name] is a 59 y.o. female who presents today for electrophysiology evaluation and pacer interrogation.  She mentions some nighttime symptoms, trouble falling asleep and feels "uncomfortable" lying on her back, but OK on either side, denies symptoms of orthopnea or PND and no CP.  No exertional or daytime symptoms, no CP.   Today, she denies symptoms of palpitations, chest pain, shortness of breath, orthopnea, PND, lower extremity edema, claudication, dizziness, presyncope, syncope, bleeding, or neurologic sequela. She suspects that the Diltiazem is making her feel very tired after she takes it despite the dose reduction at the last visit.  Past Medical History  Diagnosis Date  . 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 PAD (peripheral artery disease)     s/p L fem pop 11/2013 in setting of 1st toe osteo/gangrene  . GERD (gastroesophageal reflux disease)   . Dizziness     ? CVA 01/2010 - carotid dopplers with no ICA stenosis  . Arnold-Chiari malformation, type I     MRI brain 02/2010  . DEPRESSION   . HYPERLIPIDEMIA   . HYPERTENSION   . CAD (coronary artery disease)     a. NSTEMI 07/2009: LHC - D2 40%, LAD irreg., EF 50% with apical AK (tako-tsubo CM);  b. Inf STEMI (04/2013): Tx Promus DES to CFX;  c. 08/2013 Lexi CL: No ischemia, dist ant/ap/inf-ap infarct, EF   . SLE (systemic lupus erythematosus) dx 05/2013    follows with rheum Dareen Piano)  . Sjogren's syndrome 06/02/13    pt denies this (12/01/13)  . Abnormal LFTs     08/2013 Lipitor 40 d/c'd and crestor 10 started   . Anxiety   .  RA (rheumatoid arthritis)     prior tx by Dr. Dareen Piano  . Anemia     as a young woman  . Tachy-brady syndrome     a. s/p STJ Leadless pacemaker 11-04-2014 Dr Johney Frame  . Paroxysmal atrial fibrillation     a. anticoagulated with Eliquis 10/2014   Past Surgical History  Procedure Laterality Date  . Coronary angioplasty  04/2013  . Tubal ligation    . Colonoscopy    . Femoral-popliteal bypass graft Left 12/02/2013    Procedure:  LEFT FEMORAL-BELOW KNEE POPLITEAL ARTERY BYPASS GRAFT;  Surgeon: Sherren Kerns, MD;  Location: [facility] OR;  Service: Vascular;  Laterality: Left;  . Amputation Left 12/02/2013    Procedure: LEFT 1ST TOE AMPUTATION;  Surgeon: Sherren Kerns, MD;  Location: [facility] OR;  Service: Vascular;  Laterality: Left;  . Left heart catheterization with coronary angiogram N/A 05/19/2013    Procedure: LEFT HEART CATHETERIZATION WITH CORONARY ANGIOGRAM;  Surgeon: Micheline Chapman, MD;  Location: [facility] CATH LAB;  Service: Cardiovascular;  Laterality: N/A;  . Percutaneous coronary stent intervention (pci-s)  05/19/2013    Procedure: PERCUTANEOUS CORONARY STENT INTERVENTION (PCI-S);  Surgeon: Micheline Chapman, MD;  Location: [facility] CATH LAB;  Service: Cardiovascular;;  . Abdominal aortagram N/A 11/24/2013    Procedure: ABDOMINAL AORTAGRAM WITH BILATERAL RUNOFF WITH POSSIBLE INTERVENTION;  Surgeon: Chuck Hint, MD;  Location: [facility] CATH LAB;  Service: Cardiovascular;  Laterality: N/A;  . Left heart catheterization with coronary angiogram N/A 10/28/2014    Procedure: LEFT HEART CATHETERIZATION WITH CORONARY ANGIOGRAM;  Surgeon: Iran Ouch, MD;  Location: [facility] CATH LAB;  Service: Cardiovascular;  Laterality: N/A;  . Permanent pacemaker insertion N/A 11/04/2014    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     Current Outpatient Prescriptions  Medication Sig Dispense Refill  . aspirin EC 81 MG tablet Take 1 tablet (81 mg total) by mouth daily.    [name] ELIQUIS 5 MG TABS tablet TAKE 1  TABLET BY MOUTH TWICE DAILY 60 tablet 3  . lisinopril (PRINIVIL,ZESTRIL) 5 MG tablet Take 1 tablet (5 mg total) by mouth daily.    . nitroGLYCERIN (NITROSTAT) 0.4 MG SL tablet Place 1 tablet (0.4 mg total) under the tongue every 5 (five) minutes as needed for chest pain (MAX 3 TABLETS). 25 tablet 3  . rosuvastatin (CRESTOR) 10 MG tablet TAKE 1 TABLET BY MOUTH DAILY 30 tablet 5  . gabapentin (NEURONTIN) 100 MG capsule Take 1-3 capsules at bedtime as needed for phantom pain (Patient not taking: Reported on 05/05/2015) 40 capsule 2  . metoprolol succinate (TOPROL-XL) 25 MG 24 hr tablet Take 1 tablet (25 mg total) by mouth daily. 90 tablet 3   No current facility-administered medications for this visit.    Allergies:   Brilinta   Social History:  The patient  reports that she quit smoking about 1 years ago. Her smoking use included Cigarettes. She has never used smokeless tobacco. She reports that she drinks alcohol. She reports that she does not use illicit drugs.   Family History:  The patient's family history includes Arthritis in her father and mother; COPD in her father; Diabetes in her other; Emphysema in her mother.    ROS:  Please see the history of present illness.   All other systems are reviewed and negative.    PHYSICAL EXAM: VS:  BP 158/88 mmHg  Pulse 71  Ht 5\' 6"  (1.676 m)  Wt 55.702 kg (122 lb 12.8 oz)  BMI 19.83 kg/m2 , BMI Body mass index is 19.83 kg/(m^2). GEN: Well nourished, well developed, in no acute distress HEENT: normal Neck: no JVD, carotid bruits, or masses Cardiac: RRR; no murmurs, rubs, or gallops,no edema  Respiratory:  clear to auscultation bilaterally, normal work of breathing GI: soft, nontender, nondistended, + BS MS: no deformity or atrophy Skin: warm and dry  Neuro:  Strength and sensation are intact Psych: euthymic mood, full affect  EKG:   The ekg ordered today SR, T changes appear more pronounced/new from previous EKGs   Recent  Labs: 10/27/2014: Magnesium 1.8 10/29/2014: TSH 0.571 02/01/2015: ALT 15; BUN 14; Creatinine, Ser 0.96; Hemoglobin 13.5; Platelets 317.0; Potassium 3.9; Sodium 135    Lipid Panel     Component Value Date/Time   CHOL 158 02/01/2015 0912   TRIG 63.0 02/01/2015 0912   HDL 62.40 02/01/2015 0912   CHOLHDL 3 02/01/2015 0912   VLDL 12.6 02/01/2015 0912   LDLCALC 83 02/01/2015 0912     Wt Readings from Last 3 Encounters:  05/05/15 55.702 kg (122 lb 12.8 oz)  04/29/15 54.432 kg (120 lb)  03/22/15 55.702 kg (122 lb 12.8 oz)      Other studies Reviewed: Additional studies/ records that were reviewed today include: March 2016: Echo with EF 60-65% Pacer interogation, 100% sensing, no significant tachycardia.  ASSESSMENT AND PLAN:  1.  Tachy/brady syndrome  Normal leadless pacemaker function     See Pace Art report     No changes today  2. PAF     Eliquis     Reports fatigue after taking Cardizem     We will stop cardizem and start Metoprolol succ 25mg  daily  3. CAD     Last PCI was 2014     No CP but reports nocturnal "discomfort" in her chest     T changes on her EKG are new     She is instructed to f/u with her primary cardiologist, changing her cardizem to beta blocker, she on ASA, statin therapy     Will repeat myoview at this time  4. HTN     elevated     Changing Cardizem to Metoprolol   Follow-up: She is instructed to see Dr at next available  Return to see EP PA in 6 months I will see in a year  Signed, Jens Som, MD  05/05/2015 10:46 PM     [facility] [address] [place] [phone] (office) [phone] (fax)

## 2015-05-28 ENCOUNTER — Telehealth: Payer: Self-pay | Admitting: *Deleted

## 2015-05-28 DIAGNOSIS — R0789 Other chest pain: Secondary | ICD-10-CM

## 2015-05-28 NOTE — Telephone Encounter (Signed)
-----   Message from Hillis Range, MD sent at 05/05/2015 10:50 PM EDT ----- Regarding: patient Please let patient know that I have reviewed her EKG further.  Given her symptoms of chest discomfort at night, I think that we should order a repeat myoview.  Please order a stress myoview for her.  Thanks!

## 2015-05-28 NOTE — Telephone Encounter (Signed)
I spoke with patient and she is not having any chest pain at night(says she never reported that) only phantom pain from the amputation of left great toe( amputated last 04/2014).  I let her know I would discuss with Dr Johney Frame next week and get back with her in regards to stress test and if he still wants to obtain.

## 2015-05-28 NOTE — Telephone Encounter (Signed)
Called patient to see how she is feeling.  I have left her a message to return my call to discuss Dr Jenel Lucks message

## 2015-05-30 LAB — CUP PACEART INCLINIC DEVICE CHECK
Battery Remaining Longevity: >25
Brady Statistic RV Percent Paced: 0 %
Date Time Interrogation Session: 20161002121411
Lead Channel Sensing Intrinsic Amplitude: 12 mV
Lead Channel Setting Pacing Amplitude: 2 V
Lead Channel Setting Sensing Sensitivity: 2 mV
MDC IDC MSMT LEADCHNL RV IMPEDANCE VALUE: 330 Ohm
MDC IDC MSMT LEADCHNL RV PACING THRESHOLD AMPLITUDE: 0.5 V
MDC IDC MSMT LEADCHNL RV PACING THRESHOLD PULSEWIDTH: 0.4 ms
MDC IDC SET LEADCHNL RV PACING PULSEWIDTH: 0.4 ms

## 2015-06-02 NOTE — Telephone Encounter (Signed)
I have let Dr Johney Frame know the patient is not having CP and will let us know if anything changes

## 2015-06-28 NOTE — Progress Notes (Signed)
HPI: followup CAD. PreviousTako-Tsubo CM. Carotid Dopplers in June of 2011 were negative. MRA in July of 2011 secondary to dizziness was also negative for carotid disease. 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 LHC (05/19/13): mLAD 50, pD1 40-50, pCFX 75 (culprit), OM with 2 subbranches with abrupt cessation of flow suggestive of embolic event, and pRCA 50-60, mRCA 50-60; faint collaterals from RCA to dCFX; EF 55%, inferior AK. PCI: Promus premier (3.5x16 mm) DES to pCFX. No beta blocker was started due to bradycardia. Nuclear study in January of 2015 showed an ejection fraction of 58%. There was infarct but no ischemia.  ABIs April 2015 consistent with moderate arterial insufficiency bilaterally.  Echocardiogram March 2016 showed normal LV function, mild left ventricular hypertrophy and grade 1 diastolic dysfunction. Diagnosed with atrial flutter/fibrillation in March 2016. Had tachybradycardia and had pacemaker inserted. Cardiac catheterization in March 2016 showed a patent ostial left circumflex stent and no other obstructive coronary artery disease. Normal LV function with inferior hypokinesis. Since she was last seen, the patient has dyspnea with more extreme activities but not with routine activities. It is relieved with rest. It is not associated with chest pain. There is no orthopnea, PND or pedal edema. There is no syncope or palpitations. There is no exertional chest pain.   Current Outpatient Prescriptions  Medication Sig Dispense Refill  . aspirin EC 81 MG tablet Take 1 tablet (81 mg total) by mouth daily.    [name] ELIQUIS 5 MG TABS tablet TAKE 1 TABLET BY MOUTH TWICE DAILY 60 tablet 3  . gabapentin (NEURONTIN) 100 MG capsule Take 1-3 capsules at bedtime as needed for phantom pain 40 capsule 2  . lisinopril (PRINIVIL,ZESTRIL) 5 MG tablet Take 1 tablet (5 mg total) by mouth daily.    . metoprolol succinate (TOPROL-XL) 25 MG 24 hr tablet Take 1 tablet (25 mg total) by  mouth daily. 90 tablet 3  . nitroGLYCERIN (NITROSTAT) 0.4 MG SL tablet Place 1 tablet (0.4 mg total) under the tongue every 5 (five) minutes as needed for chest pain (MAX 3 TABLETS). 25 tablet 3  . rosuvastatin (CRESTOR) 10 MG tablet TAKE 1 TABLET BY MOUTH DAILY 30 tablet 5   No current facility-administered medications for this visit.     Past Medical History  Diagnosis Date  . 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 PAD (peripheral artery disease) (HCC)     s/p L fem pop 11/2013 in setting of 1st toe osteo/gangrene  . GERD (gastroesophageal reflux disease)   . Dizziness     ? CVA 01/2010 - carotid dopplers with no ICA stenosis  . Arnold-Chiari malformation, type I (HCC)     MRI brain 02/2010  . DEPRESSION   . HYPERLIPIDEMIA   . HYPERTENSION   . CAD (coronary artery disease)     a. NSTEMI 07/2009: LHC - D2 40%, LAD irreg., EF 50% with apical AK (tako-tsubo CM);  b. Inf STEMI (04/2013): Tx Promus DES to CFX;  c. 08/2013 Lexi CL: No ischemia, dist ant/ap/inf-ap infarct, EF   . SLE (systemic lupus erythematosus) (HCC) dx 05/2013    follows with rheum Dareen Piano)  . Sjogren's syndrome (HCC) 06/02/13    pt denies this (12/01/13)  . Abnormal LFTs     08/2013 Lipitor 40 d/c'd and crestor 10 started   . Anxiety   . RA (rheumatoid arthritis) (HCC)  prior tx by Dr. Dareen Piano  . Anemia     as a young woman  . Tachy-brady syndrome (HCC)     a. s/p STJ Leadless pacemaker 11-04-2014 Dr Johney Frame  . Paroxysmal atrial fibrillation (HCC)     a. anticoagulated with Eliquis 10/2014    Past Surgical History  Procedure Laterality Date  . Coronary angioplasty  04/2013  . Tubal ligation    . Colonoscopy    . Femoral-popliteal bypass graft Left 12/02/2013    Procedure:  LEFT FEMORAL-BELOW KNEE POPLITEAL ARTERY BYPASS GRAFT;  Surgeon: Sherren Kerns, MD;  Location: [facility] OR;  Service: Vascular;  Laterality: Left;  . Amputation Left 12/02/2013      Procedure: LEFT 1ST TOE AMPUTATION;  Surgeon: Sherren Kerns, MD;  Location: [facility] OR;  Service: Vascular;  Laterality: Left;  . Left heart catheterization with coronary angiogram N/A 05/19/2013    Procedure: LEFT HEART CATHETERIZATION WITH CORONARY ANGIOGRAM;  Surgeon: Micheline Chapman, MD;  Location: [facility] CATH LAB;  Service: Cardiovascular;  Laterality: N/A;  . Percutaneous coronary stent intervention (pci-s)  05/19/2013    Procedure: PERCUTANEOUS CORONARY STENT INTERVENTION (PCI-S);  Surgeon: Micheline Chapman, MD;  Location: [facility] CATH LAB;  Service: Cardiovascular;;  . Abdominal aortagram N/A 11/24/2013    Procedure: ABDOMINAL AORTAGRAM WITH BILATERAL RUNOFF WITH POSSIBLE INTERVENTION;  Surgeon: Chuck Hint, MD;  Location: [facility] CATH LAB;  Service: Cardiovascular;  Laterality: N/A;  . Left heart catheterization with coronary angiogram N/A 10/28/2014    Procedure: LEFT HEART CATHETERIZATION WITH CORONARY ANGIOGRAM;  Surgeon: Iran Ouch, MD;  Location: [facility] CATH LAB;  Service: Cardiovascular;  Laterality: N/A;  . Permanent pacemaker insertion N/A 11/04/2014    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    Social History   Social History  . Marital Status: Single    Spouse Name: N/A  . Number of Children: 3  . Years of Education: N/A   Occupational History  . Not on file.   Social History Main Topics  . Smoking status: Former Smoker    Types: Cigarettes    Quit date: 05/19/2013  . Smokeless tobacco: Never Used     Comment:    . Alcohol Use: 0.0 oz/week    0 Standard drinks or equivalent per week  . Drug Use: No  . Sexual Activity: Not on file   Other Topics Concern  . Not on file   Social History Narrative   Lives alone.    ROS: no fevers or chills, productive cough, hemoptysis, dysphasia, odynophagia, melena, hematochezia, dysuria, hematuria, rash, seizure activity, orthopnea, PND, pedal edema, claudication. Remaining systems are negative.  Physical  Exam: Well-developed well-nourished in no acute distress.  Skin is warm and dry.  HEENT is normal.  Neck is supple.  Chest is clear to auscultation with normal expansion.  Cardiovascular exam is regular rate and rhythm.  Abdominal exam nontender or distended. No masses palpated. Extremities show no edema. neuro grossly intact

## 2015-06-29 ENCOUNTER — Encounter: Payer: Self-pay | Admitting: Cardiology

## 2015-06-29 ENCOUNTER — Ambulatory Visit (INDEPENDENT_AMBULATORY_CARE_PROVIDER_SITE_OTHER): Payer: Medicare Other | Admitting: Cardiology

## 2015-06-29 VITALS — BP 170/80 | HR 60 | Ht 67.0 in | Wt 125.6 lb

## 2015-06-29 DIAGNOSIS — I739 Peripheral vascular disease, unspecified: Secondary | ICD-10-CM

## 2015-06-29 DIAGNOSIS — I10 Essential (primary) hypertension: Secondary | ICD-10-CM

## 2015-06-29 DIAGNOSIS — I2510 Atherosclerotic heart disease of native coronary artery without angina pectoris: Secondary | ICD-10-CM

## 2015-06-29 DIAGNOSIS — Z9861 Coronary angioplasty status: Secondary | ICD-10-CM

## 2015-06-29 DIAGNOSIS — E785 Hyperlipidemia, unspecified: Secondary | ICD-10-CM

## 2015-06-29 DIAGNOSIS — F17200 Nicotine dependence, unspecified, uncomplicated: Secondary | ICD-10-CM

## 2015-06-29 DIAGNOSIS — I495 Sick sinus syndrome: Secondary | ICD-10-CM

## 2015-06-29 MED ORDER — ROSUVASTATIN CALCIUM 40 MG PO TABS: 40.0000 mg | ORAL_TABLET | Freq: Every day | ORAL | Status: DC

## 2015-06-29 MED ORDER — LISINOPRIL 10 MG PO TABS: 10.0000 mg | ORAL_TABLET | Freq: Every day | ORAL | Status: DC

## 2015-06-29 NOTE — Assessment & Plan Note (Signed)
Status post pacemaker. Followed by electrophysiology.

## 2015-06-29 NOTE — Assessment & Plan Note (Signed)
Continue statin. 

## 2015-06-29 NOTE — Assessment & Plan Note (Signed)
Patient counseled on discontinuing. 

## 2015-06-29 NOTE — Assessment & Plan Note (Addendum)
Blood pressure Elevated. Increase lisinopril to 10 mg daily. Check potassium and renal function in 4 weeks.

## 2015-06-29 NOTE — Assessment & Plan Note (Signed)
Patient with history of atrial fibrillation and flutter.In sinus rhythm on examination. Continue beta blocker. Continue apixaban. Check hemoglobin and renal function.

## 2015-06-29 NOTE — Assessment & Plan Note (Signed)
Continue statin. Discontinue aspirin given need for anticoagulation. 

## 2015-06-29 NOTE — Assessment & Plan Note (Addendum)
Change Crestor to 40 mg daily. Check lipids and liver in 4 weeks. 

## 2015-06-29 NOTE — Patient Instructions (Signed)
Medication Instructions:   INCREASE LISINOPRIL TO 10 MG ONCE DAILY= 2 OF THE 5 MG TABLETS ONCE DAILY  INCREASE ROUSVASTATIN TO 40 MG ONCE DAILY= 4 OF THE 10 MG TABLETS ONCE DAILY  Labwork:  Your physician recommends that you return for lab work in: 4 WEEKS= DO NOT EAT PRIOR TO LAB WORK  Follow-Up:  Your physician wants you to follow-up in: 6 MONTHS WITH DR Jens Som You will receive a reminder letter in the mail two months in advance. If you don't receive a letter, please call our office to schedule the follow-up appointment.   If you need a refill on your cardiac medications before your next appointment, please call your pharmacy.

## 2015-07-02 ENCOUNTER — Telehealth: Payer: Self-pay | Admitting: *Deleted

## 2015-07-02 NOTE — Telephone Encounter (Signed)
I spoke with patient about potential battery malfunction due to a dry battery cell in the Leadless pacemaker.. I informed patient if she has any lightheadness, dizziness,or fainting to call Dr. Jenel Lucks office for an appointment, or if an emergency call 911. Patient verbalized understanding.

## 2015-08-13 ENCOUNTER — Telehealth: Payer: Self-pay | Admitting: Cardiology

## 2015-08-13 MED ORDER — APIXABAN 5 MG PO TABS: 5.0000 mg | ORAL_TABLET | Freq: Two times a day (BID) | ORAL | Status: DC

## 2015-08-13 NOTE — Telephone Encounter (Signed)
Pt requested refill on Eliquis for 90 days at local pharmacy. Informed pt I submitted electronically. Pt voiced thanks.  Instructed to call if further needs.

## 2015-08-13 NOTE — Telephone Encounter (Signed)
°  New Prob   Pt has a question regarding a prescription. Pt did not want to leave much detail. Please call.

## 2015-09-15 ENCOUNTER — Encounter: Payer: Self-pay | Admitting: *Deleted

## 2015-09-15 DIAGNOSIS — Z006 Encounter for examination for normal comparison and control in clinical research program: Secondary | ICD-10-CM

## 2015-09-15 NOTE — Progress Notes (Signed)
I saw patient for Leadlless study battery check. The pacemaker was interrogated and device had no issues. Patient is not dependent on pacemaker so patient decided to have next battery check in March with pacemaker clinic. I will scan results of today's check into EPIC. Loletha Carrow RN

## 2015-10-04 ENCOUNTER — Ambulatory Visit (INDEPENDENT_AMBULATORY_CARE_PROVIDER_SITE_OTHER): Payer: Medicare Other | Admitting: Internal Medicine

## 2015-10-04 ENCOUNTER — Other Ambulatory Visit: Payer: Self-pay | Admitting: Internal Medicine

## 2015-10-04 ENCOUNTER — Encounter: Payer: Self-pay | Admitting: Internal Medicine

## 2015-10-04 ENCOUNTER — Ambulatory Visit (INDEPENDENT_AMBULATORY_CARE_PROVIDER_SITE_OTHER)
Admission: RE | Admit: 2015-10-04 | Discharge: 2015-10-04 | Disposition: A | Discharge status: Home / Self Care | Payer: Medicare Other | Source: Ambulatory Visit | Attending: Internal Medicine | Admitting: Internal Medicine

## 2015-10-04 VITALS — BP 154/94 | HR 88 | Temp 98.8°F | Resp 16 | Wt 122.0 lb

## 2015-10-04 DIAGNOSIS — R05 Cough: Secondary | ICD-10-CM | POA: Diagnosis not present

## 2015-10-04 DIAGNOSIS — J441 Chronic obstructive pulmonary disease with (acute) exacerbation: Secondary | ICD-10-CM | POA: Insufficient documentation

## 2015-10-04 DIAGNOSIS — R509 Fever, unspecified: Secondary | ICD-10-CM

## 2015-10-04 DIAGNOSIS — R059 Cough, unspecified: Secondary | ICD-10-CM

## 2015-10-04 MED ORDER — CEPHALEXIN 500 MG PO CAPS: 500.0000 mg | ORAL_CAPSULE | Freq: Four times a day (QID) | ORAL | Status: DC

## 2015-10-04 NOTE — Progress Notes (Signed)
Pre visit review using our clinic review tool, if applicable. No additional management support is needed unless otherwise documented below in the visit note. 

## 2015-10-04 NOTE — Patient Instructions (Signed)
A chest xray was ordered.  We will call you with the results and send in an antibiotic to your pharmacy.    You can start the claritin.     Please call if you are not feeling better.

## 2015-10-04 NOTE — Progress Notes (Signed)
Subjective:    Patient ID: Mercedes [name], female    DOB: 08-Oct-1955, 60 y.o.   MRN: [id number]  HPI She is here for an acute visit for cold symptoms.   Her symptoms started 3 weeks ago.  She has a productive cough and it is worse in the morning and at night.  During the day her cough is not bad.  Her phlegm is very thick, and was initially discolored and is more white now - just very thick.  She has some shortness of breath on occasion, which is worse than her baseline.  She sometimes wheezes, which is not new.    She has had low grade fevers, nasal congestion in the morning, intermittent right ear pain.    She denies chest pain, headaches, lightheadedness and sinus pressure.    She has not taken any medications for her symptoms.    Medications and allergies reviewed with patient and updated if appropriate.  Patient Active Problem List   Diagnosis Date Noted  . Phantom limb pain-Left great toe 04/29/2015  . Seizure (HCC)   . Atrial flutter with rapid ventricular response vs. SVT 10/27/2014    Class: Acute  . Noncompliance with medication regimen 10/27/2014  . 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 PAD (peripheral artery disease) (HCC) 12/02/2013  . Unstable angina-jaw pain 09/03/2013  . Abnormal LFTs on Lipitor- changed to Crestor but pt not compliant 09/03/2013  . SLE (systemic lupus erythematosus) (HCC)   . CAD - S/P Takotsubo MI in 2000, CFX DES Sept 2014 05/25/2013  . Acute MI, inferior wall (HCC) 05/19/2013  . 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 Tachycardia-bradycardia (HCC) 03/09/2010  . DEPRESSION 11/02/2009  . PVD- s/p Lt [facility] April 2015 10/29/2009  . TOBACCO ABUSE 09/17/2009  . Dyslipidemia 08/26/2009  . Essential hypertension 08/26/2009  . CORONARY ATHEROSCLEROSIS NATIVE CORONARY ARTERY 08/26/2009  . 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 GERD 08/26/2009  . Rheumatoid arthritis(714.0) 08/26/2009     Current Outpatient Prescriptions on File Prior to Visit  Medication Sig Dispense Refill  . apixaban (ELIQUIS) 5 MG TABS tablet Take 1 tablet (5 mg total) by mouth 2 (two) times daily. 180 tablet 1  . aspirin EC 81 MG tablet Take 1 tablet (81 mg total) by mouth daily.    [name] gabapentin (NEURONTIN) 100 MG capsule Take 1-3 capsules at bedtime as needed for phantom pain 40 capsule 2  . lisinopril (PRINIVIL,ZESTRIL) 10 MG tablet Take 1 tablet (10 mg total) by mouth daily. 90 tablet 3  . metoprolol succinate (TOPROL-XL) 25 MG 24 hr tablet Take 1 tablet (25 mg total) by mouth daily. 90 tablet 3  . nitroGLYCERIN (NITROSTAT) 0.4 MG SL tablet Place 1 tablet (0.4 mg total) under the tongue every 5 (five) minutes as needed for chest pain (MAX 3 TABLETS). 25 tablet 3  . rosuvastatin (CRESTOR) 40 MG tablet Take 1 tablet (40 mg total) by mouth daily. 90 tablet 3   No current facility-administered medications on file prior to visit.    Past Medical History  Diagnosis Date  . 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 PAD (peripheral artery disease) (HCC)     s/p L fem pop 11/2013 in setting of 1st toe osteo/gangrene  . GERD (gastroesophageal reflux disease)   . Dizziness     ? CVA 01/2010 - carotid dopplers with no ICA stenosis  .  Arnold-Chiari malformation, type I (HCC)     MRI brain 02/2010  . DEPRESSION   . HYPERLIPIDEMIA   . HYPERTENSION   . CAD (coronary artery disease)     a. NSTEMI 07/2009: LHC - D2 40%, LAD irreg., EF 50% with apical AK (tako-tsubo CM);  b. Inf STEMI (04/2013): Tx Promus DES to CFX;  c. 08/2013 Lexi CL: No ischemia, dist ant/ap/inf-ap infarct, EF   . SLE (systemic lupus erythematosus) (HCC) dx 05/2013    follows with rheum Dareen Piano)  . Sjogren's syndrome (HCC) 06/02/13    pt denies this (12/01/13)  . Abnormal LFTs     08/2013 Lipitor 40 d/c'd and crestor 10 started   . Anxiety   . RA (rheumatoid arthritis)  (HCC)     prior tx by Dr. Dareen Piano  . Anemia     as a young woman  . Tachy-brady syndrome (HCC)     a. s/p STJ Leadless pacemaker 11-04-2014 Dr Johney Frame  . Paroxysmal atrial fibrillation (HCC)     a. anticoagulated with Eliquis 10/2014    Past Surgical History  Procedure Laterality Date  . Coronary angioplasty  04/2013  . Tubal ligation    . Colonoscopy    . Femoral-popliteal bypass graft Left 12/02/2013    Procedure:  LEFT FEMORAL-BELOW KNEE POPLITEAL ARTERY BYPASS GRAFT;  Surgeon: Sherren Kerns, MD;  Location: [facility] OR;  Service: Vascular;  Laterality: Left;  . Amputation Left 12/02/2013    Procedure: LEFT 1ST TOE AMPUTATION;  Surgeon: Sherren Kerns, MD;  Location: [facility] OR;  Service: Vascular;  Laterality: Left;  . Left heart catheterization with coronary angiogram N/A 05/19/2013    Procedure: LEFT HEART CATHETERIZATION WITH CORONARY ANGIOGRAM;  Surgeon: Micheline Chapman, MD;  Location: [facility] CATH LAB;  Service: Cardiovascular;  Laterality: N/A;  . Percutaneous coronary stent intervention (pci-s)  05/19/2013    Procedure: PERCUTANEOUS CORONARY STENT INTERVENTION (PCI-S);  Surgeon: Micheline Chapman, MD;  Location: [facility] CATH LAB;  Service: Cardiovascular;;  . Abdominal aortagram N/A 11/24/2013    Procedure: ABDOMINAL AORTAGRAM WITH BILATERAL RUNOFF WITH POSSIBLE INTERVENTION;  Surgeon: Chuck Hint, MD;  Location: [facility] CATH LAB;  Service: Cardiovascular;  Laterality: N/A;  . Left heart catheterization with coronary angiogram N/A 10/28/2014    Procedure: LEFT HEART CATHETERIZATION WITH CORONARY ANGIOGRAM;  Surgeon: Iran Ouch, MD;  Location: [facility] CATH LAB;  Service: Cardiovascular;  Laterality: N/A;  . Permanent pacemaker insertion N/A 11/04/2014    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    Social History   Social History  . Marital Status: Single    Spouse Name: N/A  . Number of Children: 3  . Years of Education: N/A   Social History Main Topics  . Smoking status:  Former Smoker    Types: Cigarettes    Quit date: 05/19/2013  . Smokeless tobacco: Never Used     Comment:    . Alcohol Use: 0.0 oz/week    0 Standard drinks or equivalent per week  . Drug Use: No  . Sexual Activity: Not Asked   Other Topics Concern  . None   Social History Narrative   Lives alone.    Family History  Problem Relation Age of Onset  . Arthritis Mother   . Emphysema Mother   . Arthritis Father   . COPD Father   . Diabetes Other     Review of Systems  Constitutional: Positive for fever (low grade).  HENT: Positive for congestion (in  the mornings only) and ear pain (right ear). Negative for sinus pressure and sore throat.   Respiratory: Positive for cough, shortness of breath (slightly more than usual) and wheezing (chronic - no change).   Cardiovascular: Negative for chest pain.  Gastrointestinal: Negative for nausea, abdominal pain and diarrhea.  Neurological: Negative for light-headedness and headaches.       Objective:   Filed Vitals:   10/04/15 1414  BP: 154/94  Pulse: 88  Temp: 98.8 F (37.1 C)  Resp: 16   Filed Weights   10/04/15 1414  Weight: 122 lb (55.339 kg)   Body mass index is 19.1 kg/(m^2).   Physical Exam Constitutional: Appears well-developed and well-nourished. No distress.  Neck: Neck supple. No tracheal deviation present. No thyromegaly present.  No carotid bruit.  cervical adenopathy.   Cardiovascular: Normal rate, regular rhythm and normal heart sounds.   No murmur heard.  No edema Pulmonary/Chest: Effort normal and breath sounds normal. No respiratory distress. No wheezes.      Assessment & Plan:   Cough, productive with low grade fevers, increased sob Bronchitis vs possible PNA Will get a cxr Rest, fluids Can not afford mucinex Can take coricidan cold products or claritin Will call in an antibiotic depending on results of cxr - keflex vs possible doxycyline  Call if no improvement

## 2015-10-08 ENCOUNTER — Encounter: Payer: Self-pay | Admitting: Internal Medicine

## 2015-10-21 ENCOUNTER — Encounter: Payer: Self-pay | Admitting: Family

## 2015-10-25 NOTE — Progress Notes (Signed)
Electrophysiology Office Note Date: 10/26/2015  ID:  Mercedes [name], DOB 08-22-1956, MRN [id number]  PCP: No PCP Per Patient  Primary Cardiologist: Jens Som Electrophysiologist: Allred   CC: Pacemaker follow-up  Mercedes [name] is a 60 y.o. female is seen today for Dr Johney Frame.  She presents today for routine EP follow up.  Since last being seen in our clinic, the patient reports doing very well.  She denies chest pain, palpitations, dyspnea, PND, orthopnea, nausea, vomiting, dizziness, syncope.  She is largely unaware of her AF but does have occasional tachy-palpitations. She has quit smoking.   Device History: STJ leadless PPM implanted 10/2014 for tachy/brady syndrome   Past Medical History  Diagnosis Date  . 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 PAD (peripheral artery disease) (HCC)     s/p L fem pop 11/2013 in setting of 1st toe osteo/gangrene  . GERD (gastroesophageal reflux disease)   . Dizziness     ? CVA 01/2010 - carotid dopplers with no ICA stenosis  . Arnold-Chiari malformation, type I (HCC)     MRI brain 02/2010  . DEPRESSION   . HYPERLIPIDEMIA   . HYPERTENSION   . CAD (coronary artery disease)     a. NSTEMI 07/2009: LHC - D2 40%, LAD irreg., EF 50% with apical AK (tako-tsubo CM);  b. Inf STEMI (04/2013): Tx Promus DES to CFX;  c. 08/2013 Lexi CL: No ischemia, dist ant/ap/inf-ap infarct, EF   . SLE (systemic lupus erythematosus) (HCC) dx 05/2013    follows with rheum Dareen Piano)  . Sjogren's syndrome (HCC) 06/02/13    pt denies this (12/01/13)  . Abnormal LFTs     08/2013 Lipitor 40 d/c'd and crestor 10 started   . Anxiety   . RA (rheumatoid arthritis) (HCC)     prior tx by Dr. Dareen Piano  . Anemia     as a young woman  . Tachy-brady syndrome (HCC)     a. s/p STJ Leadless pacemaker 11-04-2014 Dr Johney Frame  . Paroxysmal atrial fibrillation (HCC)     a. anticoagulated with Eliquis 10/2014   Past  Surgical History  Procedure Laterality Date  . Coronary angioplasty  04/2013  . Tubal ligation    . Colonoscopy    . Femoral-popliteal bypass graft Left 12/02/2013    Procedure:  LEFT FEMORAL-BELOW KNEE POPLITEAL ARTERY BYPASS GRAFT;  Surgeon: Sherren Kerns, MD;  Location: [facility] OR;  Service: Vascular;  Laterality: Left;  . Amputation Left 12/02/2013    Procedure: LEFT 1ST TOE AMPUTATION;  Surgeon: Sherren Kerns, MD;  Location: [facility] OR;  Service: Vascular;  Laterality: Left;  . Left heart catheterization with coronary angiogram N/A 05/19/2013    Procedure: LEFT HEART CATHETERIZATION WITH CORONARY ANGIOGRAM;  Surgeon: Micheline Chapman, MD;  Location: [facility] CATH LAB;  Service: Cardiovascular;  Laterality: N/A;  . Percutaneous coronary stent intervention (pci-s)  05/19/2013    Procedure: PERCUTANEOUS CORONARY STENT INTERVENTION (PCI-S);  Surgeon: Micheline Chapman, MD;  Location: [facility] CATH LAB;  Service: Cardiovascular;;  . Abdominal aortagram N/A 11/24/2013    Procedure: ABDOMINAL AORTAGRAM WITH BILATERAL RUNOFF WITH POSSIBLE INTERVENTION;  Surgeon: Chuck Hint, MD;  Location: [facility] CATH LAB;  Service: Cardiovascular;  Laterality: N/A;  . Left heart catheterization with coronary angiogram N/A 10/28/2014    Procedure: LEFT HEART CATHETERIZATION WITH CORONARY ANGIOGRAM;  Surgeon: Iran Ouch, MD;  Location: [facility] CATH LAB;  Service: Cardiovascular;  Laterality: N/A;  . Permanent pacemaker insertion N/A 11/04/2014    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    Current Outpatient Prescriptions  Medication Sig Dispense Refill  . apixaban (ELIQUIS) 5 MG TABS tablet Take 1 tablet (5 mg total) by mouth 2 (two) times daily. 180 tablet 1  . aspirin EC 81 MG tablet Take 1 tablet (81 mg total) by mouth daily.    [name] gabapentin (NEURONTIN) 100 MG capsule Take 1-3 capsules at bedtime as needed for phantom pain 40 capsule 2  . lisinopril (PRINIVIL,ZESTRIL) 10 MG tablet Take 1 tablet (10 mg total)  by mouth daily. 90 tablet 3  . metoprolol succinate (TOPROL-XL) 25 MG 24 hr tablet Take 1 tablet (25 mg total) by mouth daily. 90 tablet 3  . nitroGLYCERIN (NITROSTAT) 0.4 MG SL tablet Place 1 tablet (0.4 mg total) under the tongue every 5 (five) minutes as needed for chest pain (MAX 3 TABLETS). 25 tablet 3  . rosuvastatin (CRESTOR) 40 MG tablet Take 1 tablet (40 mg total) by mouth daily. 90 tablet 3   No current facility-administered medications for this visit.    Allergies:   Brilinta   Social History: Social History   Social History  . Marital Status: Single    Spouse Name: N/A  . Number of Children: 3  . Years of Education: N/A   Occupational History  . Not on file.   Social History Main Topics  . Smoking status: Former Smoker    Types: Cigarettes    Quit date: 05/19/2013  . Smokeless tobacco: Never Used     Comment:    . Alcohol Use: 0.0 oz/week    0 Standard drinks or equivalent per week  . Drug Use: No  . Sexual Activity: Not on file   Other Topics Concern  . Not on file   Social History Narrative   Lives alone.    Family History: Family History  Problem Relation Age of Onset  . Arthritis Mother   . Emphysema Mother   . Arthritis Father   . COPD Father   . Diabetes Other      Review of Systems: All other systems reviewed and are otherwise negative except as noted above.   Physical Exam: VS:  BP 124/70 mmHg  Pulse 70  Ht 5\' 6"  (1.676 m)  Wt 121 lb (54.885 kg)  BMI 19.54 kg/m2  SpO2 98% , BMI Body mass index is 19.54 kg/(m^2).  GEN- The patient is well and thin appearing, alert and oriented x 3 today.   HEENT: normocephalic, atraumatic; sclera clear, conjunctiva pink; hearing intact; oropharynx clear; neck supple Lungs- Clear to ausculation bilaterally, normal work of breathing.  No wheezes, rales, rhonchi Heart- Regular rate and rhythm, no murmurs, rubs or gallops GI- soft, non-tender, non-distended, bowel sounds present Extremities- no  clubbing, cyanosis, or edema; DP/PT/radial pulses 2+ bilaterally MS- no significant deformity or atrophy Skin- warm and dry, no rash or lesion Psych- euthymic mood, full affect Neuro- strength and sensation are intact  PPM Interrogation- reviewed in detail today,  See PACEART report  EKG:  EKG is not ordered today.  Recent Labs: 10/27/2014: Magnesium 1.8 10/29/2014: TSH 0.571 02/01/2015: ALT 15; BUN 14; Creatinine, Ser 0.96; Hemoglobin 13.5; Platelets 317.0; Potassium 3.9; Sodium 135   Wt Readings from Last 3 Encounters:  10/26/15 121 lb (54.885 kg)  10/04/15 122 lb (55.339 kg)  06/29/15 125 lb 9.6 oz (56.972 kg)     Other studies Reviewed: Additional  studies/ records that were reviewed today include:  Dr Jenel Lucks office note  Assessment and Plan:  1.  Tachy/brady syndrome Normal leadless PPM function See Pace Art report No changes today  2.  Paroxysmal atrial fibrillation Continue Eliquis for CHADS2VASC score of at least 4 Continue Diltiazem for rate control  3.  CAD No recent ischemic symptoms Continue current therapy  4.  Tobacco abuse She has quit smoking   5.  HTN Stable No change required today  Current medicines are reviewed at length with the patient today.   The patient does not have concerns regarding her medicines.  The following changes were made today:  none  Labs/ tests ordered today include: none  Disposition:   Follow up with Dr Johney Frame in 6 months, Dr Jens Som as scheduled    Signed, [name], NP 10/26/2015 4:23 PM  [facility] [address] [place] [address] [phone] (office) [phone] (fax)

## 2015-10-26 ENCOUNTER — Encounter: Payer: Self-pay | Admitting: Nurse Practitioner

## 2015-10-26 ENCOUNTER — Ambulatory Visit (INDEPENDENT_AMBULATORY_CARE_PROVIDER_SITE_OTHER): Payer: Medicare Other | Admitting: Nurse Practitioner

## 2015-10-26 VITALS — BP 124/70 | HR 70 | Ht 66.0 in | Wt 121.0 lb

## 2015-10-26 DIAGNOSIS — I10 Essential (primary) hypertension: Secondary | ICD-10-CM | POA: Diagnosis not present

## 2015-10-26 DIAGNOSIS — Z720 Tobacco use: Secondary | ICD-10-CM

## 2015-10-26 DIAGNOSIS — I480 Paroxysmal atrial fibrillation: Secondary | ICD-10-CM | POA: Diagnosis not present

## 2015-10-26 DIAGNOSIS — I495 Sick sinus syndrome: Secondary | ICD-10-CM

## 2015-10-26 NOTE — Patient Instructions (Addendum)
Medication Instructions:   Your physician recommends that you continue on your current medications as directed. Please refer to the Current Medication list given to you today.   If you need a refill on your cardiac medications before your next appointment, please call your pharmacy.  Labwork: NONE ORDER TODAY    Testing/Procedures: NONE ORDER TODAY    Follow-Up:  Your physician wants you to follow-up in:  IN  6  MONTHS WITH DR ALLRED   You will receive a reminder letter in the mail two months in advance. If you don't receive a letter, please call our office to schedule the follow-up appointment.  IN 3 MONTHS WITH RESEARCH TEAM WILL CONTACT YOU BACK WITH APPOINTMENT DATES AND TIMES   Any Other Special Instructions Will Be Listed Below (If Applicable).                                                                                                                                                   

## 2015-10-27 ENCOUNTER — Other Ambulatory Visit: Payer: Self-pay | Admitting: *Deleted

## 2015-10-27 DIAGNOSIS — Z48812 Encounter for surgical aftercare following surgery on the circulatory system: Secondary | ICD-10-CM

## 2015-10-27 DIAGNOSIS — I739 Peripheral vascular disease, unspecified: Secondary | ICD-10-CM

## 2015-10-28 ENCOUNTER — Encounter (HOSPITAL_COMMUNITY): Payer: Self-pay

## 2015-10-28 ENCOUNTER — Other Ambulatory Visit (HOSPITAL_COMMUNITY): Payer: Self-pay

## 2015-10-28 ENCOUNTER — Ambulatory Visit: Payer: Self-pay | Admitting: Family

## 2015-10-28 LAB — CUP PACEART INCLINIC DEVICE CHECK: Date Time Interrogation Session: 20170302071134

## 2015-10-29 ENCOUNTER — Encounter: Payer: Medicare Other | Admitting: Nurse Practitioner

## 2015-11-25 ENCOUNTER — Encounter: Payer: Self-pay | Admitting: Cardiology

## 2015-12-24 ENCOUNTER — Encounter: Payer: Self-pay | Admitting: Family

## 2015-12-30 ENCOUNTER — Ambulatory Visit (HOSPITAL_COMMUNITY)
Admission: RE | Admit: 2015-12-30 | Discharge: 2015-12-30 | Disposition: A | Discharge status: Home / Self Care | Payer: Medicare Other | Source: Ambulatory Visit | Attending: Vascular Surgery | Admitting: Vascular Surgery

## 2015-12-30 ENCOUNTER — Ambulatory Visit (INDEPENDENT_AMBULATORY_CARE_PROVIDER_SITE_OTHER)
Admission: RE | Admit: 2015-12-30 | Discharge: 2015-12-30 | Disposition: A | Discharge status: Home / Self Care | Payer: Medicare Other | Source: Ambulatory Visit | Attending: Vascular Surgery | Admitting: Vascular Surgery

## 2015-12-30 ENCOUNTER — Encounter: Payer: Self-pay | Admitting: Family

## 2015-12-30 ENCOUNTER — Ambulatory Visit (INDEPENDENT_AMBULATORY_CARE_PROVIDER_SITE_OTHER): Payer: Medicare Other | Admitting: Family

## 2015-12-30 VITALS — BP 150/96 | HR 61 | Temp 97.6°F | Resp 20 | Ht 66.0 in | Wt 120.7 lb

## 2015-12-30 DIAGNOSIS — I10 Essential (primary) hypertension: Secondary | ICD-10-CM | POA: Insufficient documentation

## 2015-12-30 DIAGNOSIS — I2510 Atherosclerotic heart disease of native coronary artery without angina pectoris: Secondary | ICD-10-CM | POA: Insufficient documentation

## 2015-12-30 DIAGNOSIS — E785 Hyperlipidemia, unspecified: Secondary | ICD-10-CM | POA: Diagnosis not present

## 2015-12-30 DIAGNOSIS — I739 Peripheral vascular disease, unspecified: Secondary | ICD-10-CM

## 2015-12-30 DIAGNOSIS — Z48812 Encounter for surgical aftercare following surgery on the circulatory system: Secondary | ICD-10-CM

## 2015-12-30 DIAGNOSIS — R0989 Other specified symptoms and signs involving the circulatory and respiratory systems: Secondary | ICD-10-CM | POA: Diagnosis present

## 2015-12-30 DIAGNOSIS — I779 Disorder of arteries and arterioles, unspecified: Secondary | ICD-10-CM

## 2015-12-30 DIAGNOSIS — Z95828 Presence of other vascular implants and grafts: Secondary | ICD-10-CM | POA: Diagnosis not present

## 2015-12-30 DIAGNOSIS — Z87891 Personal history of nicotine dependence: Secondary | ICD-10-CM

## 2015-12-30 DIAGNOSIS — Z7722 Contact with and (suspected) exposure to environmental tobacco smoke (acute) (chronic): Secondary | ICD-10-CM

## 2015-12-30 DIAGNOSIS — K219 Gastro-esophageal reflux disease without esophagitis: Secondary | ICD-10-CM | POA: Insufficient documentation

## 2015-12-30 NOTE — Progress Notes (Signed)
[facility] [place] HISTORY AND PHYSICAL -PAD  History of Present Illness Mercedes [name] is a 60 y.o. female patient of Dr. Darrick Penna who is s/p left femoral to below-knee popliteal bypass and amputation of her left first toe on December 02, 2013. She had one vessel peroneal runoff. This was a non-reversed vein graft. She returns for followup today. The rest pain in her left foot has resolved. She has intermittent phantom pain in the left first toe amputation; this started early in 2016. She has right calf claudication after walking about 2-3 blocks, relieved by rest, no change from previous. Pt states her blood pressure at home is about 115/74.  The gabapentin did not help her left great toe phantom pain.  Pt denies any known history of stroke or TIA, does report 2 MI's. The patient reports New Medical or Surgical History:leadless pacemaker inserted March 2016 for tachy/brady, states this doing well.  She is walking a great deal more.  Pt Diabetic: No Pt smoker: former smoker, quit in 2015; she allows smokers to smoke in her house most days of the week.  Pt meds include: Statin : yes ASA: no Other anticoagulants/antiplatelets: Eliquis    Past Medical History  Diagnosis Date  . 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 PAD (peripheral artery disease) (HCC)     s/p L fem pop 11/2013 in setting of 1st toe osteo/gangrene  . GERD (gastroesophageal reflux disease)   . Dizziness     ? CVA 01/2010 - carotid dopplers with no ICA stenosis  . Arnold-Chiari malformation, type I (HCC)     MRI brain 02/2010  . DEPRESSION   . HYPERLIPIDEMIA   . HYPERTENSION   . CAD (coronary artery disease)     a. NSTEMI 07/2009: LHC - D2 40%, LAD irreg., EF 50% with apical AK (tako-tsubo CM);  b. Inf STEMI (04/2013): Tx Promus DES to CFX;  c. 08/2013 Lexi CL: No ischemia, dist ant/ap/inf-ap infarct, EF   . SLE (systemic lupus  erythematosus) (HCC) dx 05/2013    follows with rheum Dareen Piano)  . Sjogren's syndrome (HCC) 06/02/13    pt denies this (12/01/13)  . Abnormal LFTs     08/2013 Lipitor 40 d/c'd and crestor 10 started   . Anxiety   . RA (rheumatoid arthritis) (HCC)     prior tx by Dr. Dareen Piano  . Anemia     as a young woman  . Tachy-brady syndrome (HCC)     a. s/p STJ Leadless pacemaker 11-04-2014 Dr Johney Frame  . Paroxysmal atrial fibrillation (HCC)     a. anticoagulated with Eliquis 10/2014    Social History Social History  Substance Use Topics  . Smoking status: Former Smoker    Types: Cigarettes    Quit date: 05/19/2013  . Smokeless tobacco: Never Used     Comment:    . Alcohol Use: 0.0 oz/week    0 Standard drinks or equivalent per week    Family History Family History  Problem Relation Age of Onset  . Arthritis Mother   . Emphysema Mother   . Arthritis Father   . COPD Father   . Diabetes Other     Past Surgical History  Procedure Laterality Date  . Coronary angioplasty  04/2013  . Tubal ligation    . Colonoscopy    . Femoral-popliteal bypass graft Left 12/02/2013    Procedure:  LEFT FEMORAL-BELOW KNEE  POPLITEAL ARTERY BYPASS GRAFT;  Surgeon: Sherren Kerns, MD;  Location: [facility] OR;  Service: Vascular;  Laterality: Left;  . Amputation Left 12/02/2013    Procedure: LEFT 1ST TOE AMPUTATION;  Surgeon: Sherren Kerns, MD;  Location: [facility] OR;  Service: Vascular;  Laterality: Left;  . Left heart catheterization with coronary angiogram N/A 05/19/2013    Procedure: LEFT HEART CATHETERIZATION WITH CORONARY ANGIOGRAM;  Surgeon: Micheline Chapman, MD;  Location: [facility] CATH LAB;  Service: Cardiovascular;  Laterality: N/A;  . Percutaneous coronary stent intervention (pci-s)  05/19/2013    Procedure: PERCUTANEOUS CORONARY STENT INTERVENTION (PCI-S);  Surgeon: Micheline Chapman, MD;  Location: [facility] CATH LAB;  Service: Cardiovascular;;  . Abdominal aortagram N/A 11/24/2013    Procedure: ABDOMINAL AORTAGRAM WITH  BILATERAL RUNOFF WITH POSSIBLE INTERVENTION;  Surgeon: Chuck Hint, MD;  Location: [facility] CATH LAB;  Service: Cardiovascular;  Laterality: N/A;  . Left heart catheterization with coronary angiogram N/A 10/28/2014    Procedure: LEFT HEART CATHETERIZATION WITH CORONARY ANGIOGRAM;  Surgeon: Iran Ouch, MD;  Location: [facility] CATH LAB;  Service: Cardiovascular;  Laterality: N/A;  . Permanent pacemaker insertion N/A 11/04/2014    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    Allergies  Allergen Reactions  . Brilinta [Ticagrelor] Shortness Of Breath    Also chest tightness    Current Outpatient Prescriptions  Medication Sig Dispense Refill  . apixaban (ELIQUIS) 5 MG TABS tablet Take 1 tablet (5 mg total) by mouth 2 (two) times daily. 180 tablet 1  . lisinopril (PRINIVIL,ZESTRIL) 10 MG tablet Take 1 tablet (10 mg total) by mouth daily. 90 tablet 3  . nitroGLYCERIN (NITROSTAT) 0.4 MG SL tablet Place 1 tablet (0.4 mg total) under the tongue every 5 (five) minutes as needed for chest pain (MAX 3 TABLETS). 25 tablet 3  . rosuvastatin (CRESTOR) 40 MG tablet Take 1 tablet (40 mg total) by mouth daily. 90 tablet 3  . aspirin EC 81 MG tablet Take 1 tablet (81 mg total) by mouth daily. (Patient not taking: Reported on 12/30/2015)    . gabapentin (NEURONTIN) 100 MG capsule Take 1-3 capsules at bedtime as needed for phantom pain (Patient not taking: Reported on 12/30/2015) 40 capsule 2  . metoprolol succinate (TOPROL-XL) 25 MG 24 hr tablet Take 1 tablet (25 mg total) by mouth daily. (Patient not taking: Reported on 12/30/2015) 90 tablet 3   No current facility-administered medications for this visit.    ROS: See HPI for pertinent positives and negatives.   Physical Examination  Filed Vitals:   12/30/15 1240 12/30/15 1245  BP: 156/85 150/96  Pulse: 61   Temp: 97.6 F (36.4 C)   TempSrc: Oral   Resp: 20   Height: 5\' 6"  (1.676 m)   Weight: 120 lb 11.2 oz (54.749 kg)    Body mass  index is 19.49 kg/(m^2).  General: A&O x 3, thin female, WDWN. Gait: normal Eyes: PERRLA. Pulmonary: CTAB, respirations are non labored Cardiac: regular rythm, no detected murmur.     Carotid Bruits Right Left   Negative Negative  Aorta is not palpable. Radial pulses: are 2+ palpable And =   VASCULAR EXAM: Extremities without ischemic changes  without Gangrene; without open wounds. Left great toe is surgically absent.     LE Pulses Right Left   FEMORAL  palpable  palpable    POPLITEAL not palpable  not palpable   POSTERIOR TIBIAL not palpable  not palpable    DORSALIS PEDIS  ANTERIOR TIBIAL not palpable  faintly palpable    Abdomen: soft, NT, no palpable masses. Skin: no rashes, no ulcers. Musculoskeletal: no muscle wasting or atrophy. Neurologic: A&O X 3; Appropriate Affect, sensation is normal,  MOTOR FUNCTION: moving all extremities equally, motor strength 5/5 throughout. Speech is fluent/normal. CN 2-12 intact.                Non-Invasive Vascular Imaging: DATE: 12/30/2015   Left LE Arterial Duplex: Patent left LE bypass graft with no evidence of restenosis, tri and biphasic waveforms. No significant change in comparison to the last exam on 04/29/15; however, right leg ABI's have dropped from 77% to 59%.  ABI: RIGHT: 0.59 (0.77, 04/29/15), Waveforms: monophasic;  LEFT: 0.89 (0.89), Waveforms: monophasic   ASSESSMENT: JAMILETTE SUCHOCKI is a 60 y.o. female who is s/p left femoral to below-knee popliteal bypass and amputation of her left first toe on December 02 2013. She had one vessel peroneal runoff. This was a non-reversed vein graft. The rest pain in her left foot has resolved. She has phantom pain in the left first  toe amputation site, states gabapentin did not help, but it does not seem to bother her as much.  She has right calf claudication after walking about 2-3 blocks, relieved by rest. She has no signs of ischemia in her feet/legs.  She is walking more and feeling well.  She does allow her friends to smoke in her house most days of the week; states she will not allow them to do so any longer.  Left LE arterial duplex today indicates a patent left LE bypass graft with no evidence of restenosis, tri and biphasic waveforms. No significant change in comparison to the last exam on 04/29/15; however, right leg ABI's have dropped from 77% to 59%; left ABI remains stable at 89%.  Pt's atherosclerotic risk factors include former smoker, current chronic secondhand smoke exposure, and CAD.   PLAN:  Graduated walking program discussed.   Based on the patient's vascular studies and examination, pt will return to clinic in 6 months with ABI's and left LE arterial duplex.  I discussed in depth with the patient the nature of atherosclerosis, and emphasized the importance of maximal medical management including strict control of blood pressure, blood glucose, and lipid levels, obtaining regular exercise, and continued cessation of smoking.  The patient is aware that without maximal medical management the underlying atherosclerotic disease process will progress, limiting the benefit of any interventions.  The patient was given information about PAD including signs, symptoms, treatment, what symptoms should prompt the patient to seek immediate medical care, and risk reduction measures to take.  Charisse March, RN, MSN, FNP-C [facility] [place] Phone: [phone]  Clinic MD: [facility]  12/30/2015 12:53 PM

## 2015-12-30 NOTE — Patient Instructions (Signed)
Peripheral Vascular Disease Peripheral vascular disease (PVD) is a disease of the blood vessels that are not part of your heart and brain. A simple term for PVD is poor circulation. In most cases, PVD narrows the blood vessels that carry blood from your heart to the rest of your body. This can result in a decreased supply of blood to your arms, legs, and internal organs, like your stomach or kidneys. However, it most often affects a person's lower legs and feet. There are two types of PVD.  Organic PVD. This is the more common type. It is caused by damage to the structure of blood vessels.  Functional PVD. This is caused by conditions that make blood vessels contract and tighten (spasm). Without treatment, PVD tends to get worse over time. PVD can also lead to acute ischemic limb. This is when an arm or limb suddenly has trouble getting enough blood. This is a medical emergency. CAUSES Each type of PVD has many different causes. The most common cause of PVD is buildup of a fatty material (plaque) inside of your arteries (atherosclerosis). Small amounts of plaque can break off from the walls of the blood vessels and become lodged in a smaller artery. This blocks blood flow and can cause acute ischemic limb. Other common causes of PVD include:  Blood clots that form inside of blood vessels.  Injuries to blood vessels.  Diseases that cause inflammation of blood vessels or cause blood vessel spasms.  Health behaviors and health history that increase your risk of developing PVD. RISK FACTORS  You may have a greater risk of PVD if you:  Have a family history of PVD.  Have certain medical conditions, including:  High cholesterol.  Diabetes.  High blood pressure (hypertension).  Coronary heart disease.  Past problems with blood clots.  Past injury, such as burns or a broken bone. These may have damaged blood vessels in your limbs.  Buerger disease. This is caused by inflamed blood  vessels in your hands and feet.  Some forms of arthritis.  Rare birth defects that affect the arteries in your legs.  Use tobacco.  Do not get enough exercise.  Are obese.  Are age 50 or older. SIGNS AND SYMPTOMS  PVD may cause many different symptoms. Your symptoms depend on what part of your body is not getting enough blood. Some common signs and symptoms include:  Cramps in your lower legs. This may be a symptom of poor leg circulation (claudication).  Pain and weakness in your legs while you are physically active that goes away when you rest (intermittent claudication).  Leg pain when at rest.  Leg numbness, tingling, or weakness.  Coldness in a leg or foot, especially when compared with the other leg.  Skin or hair changes. These can include:  Hair loss.  Shiny skin.  Pale or bluish skin.  Thick toenails.  Inability to get or maintain an erection (erectile dysfunction). People with PVD are more prone to developing ulcers and sores on their toes, feet, or legs. These may take longer than normal to heal. DIAGNOSIS Your health care provider may diagnose PVD from your signs and symptoms. The health care provider will also do a physical exam. You may have tests to find out what is causing your PVD and determine its severity. Tests may include:  Blood pressure recordings from your arms and legs and measurements of the strength of your pulses (pulse volume recordings).  Imaging studies using sound waves to take pictures of   the blood flow through your blood vessels (Doppler ultrasound).  Injecting a dye into your blood vessels before having imaging studies using:  X-rays (angiogram or arteriogram).  Computer-generated X-rays (CT angiogram).  A powerful electromagnetic field and a computer (magnetic resonance angiogram or MRA). TREATMENT Treatment for PVD depends on the cause of your condition and the severity of your symptoms. It also depends on your age. Underlying  causes need to be treated and controlled. These include long-lasting (chronic) conditions, such as diabetes, high cholesterol, and high blood pressure. You may need to first try making lifestyle changes and taking medicines. Surgery may be needed if these do not work. Lifestyle changes may include:  Quitting smoking.  Exercising regularly.  Following a low-fat, low-cholesterol diet. Medicines may include:  Blood thinners to prevent blood clots.  Medicines to improve blood flow.  Medicines to improve your blood cholesterol levels. Surgical procedures may include:  A procedure that uses an inflated balloon to open a blocked artery and improve blood flow (angioplasty).  A procedure to put in a tube (stent) to keep a blocked artery open (stent implant).  Surgery to reroute blood flow around a blocked artery (peripheral bypass surgery).  Surgery to remove dead tissue from an infected wound on the affected limb.  Amputation. This is surgical removal of the affected limb. This may be necessary in cases of acute ischemic limb that are not improved through medical or surgical treatments. HOME CARE INSTRUCTIONS  Take medicines only as directed by your health care provider.  Do not use any tobacco products, including cigarettes, chewing tobacco, or electronic cigarettes. If you need help quitting, ask your health care provider.  Lose weight if you are overweight, and maintain a healthy weight as directed by your health care provider.  Eat a diet that is low in fat and cholesterol. If you need help, ask your health care provider.  Exercise regularly. Ask your health care provider to suggest some good activities for you.  Use compression stockings or other mechanical devices as directed by your health care provider.  Take good care of your feet.  Wear comfortable shoes that fit well.  Check your feet often for any cuts or sores. SEEK MEDICAL CARE IF:  You have cramps in your legs  while walking.  You have leg pain when you are at rest.  You have coldness in a leg or foot.  Your skin changes.  You have erectile dysfunction.  You have cuts or sores on your feet that are not healing. SEEK IMMEDIATE MEDICAL CARE IF:  Your arm or leg turns cold and blue.  Your arms or legs become red, warm, swollen, painful, or numb.  You have chest pain or trouble breathing.  You suddenly have weakness in your face, arm, or leg.  You become very confused or lose the ability to speak.  You suddenly have a very bad headache or lose your vision.   This information is not intended to replace advice given to you by your health care provider. Make sure you discuss any questions you have with your health care provider.   Document Released: 09/21/2004 Document Revised: 09/04/2014 Document Reviewed: 01/22/2014 Elsevier Interactive Patient Education 2016 [employer]       Secondhand Smoke WHAT IS SECONDHAND SMOKE? Secondhand smoke is smoke that comes from burning tobacco. It could be the smoke from a cigarette, a pipe, or a cigar. Even if you are not the one smoking, secondhand smoke exposes you to the dangers of   smoking. This is called involuntary, or passive, smoking. There are two types of secondhand smoke:  Sidestream smoke is the smoke that comes off the lighted end of a cigarette, pipe, or cigar.  This type of smoke has the highest amount of cancer-causing agents (carcinogens).  The particles in sidestream smoke are smaller. They get into your lungs more easily.  Mainstream smoke is the smoke that is exhaled by a person who is smoking.  This type of smoke is also dangerous to your health. HOW CAN SECONDHAND SMOKE AFFECT MY HEALTH? Studies show that there is no safe level of secondhand smoke. This smoke contains thousands of chemicals. At least 69 of them are known to cause cancer. Secondhand smoke can also cause many other health problems. It has been linked  to:  Lung cancer.  Cancer of the voice box (larynx) or throat.  Cancer of the sinuses.  Brain cancer.  Bladder cancer.  Stomach cancer.  Breast cancer.  White blood cell cancers (lymphoma and leukemia).  Brain and liver tumors in children.  Heart disease and stroke in adults.  Pregnancy loss (miscarriage).  Diseases in children, such as:  Asthma.  Lung infections.  Ear infections.  Sudden infant death syndrome (SIDS).  Slow growth. WHERE CAN I BE AT RISK FOR EXPOSURE TO SECONDHAND SMOKE?   For adults, the workplace is the main source of exposure to secondhand smoke.  Your workplace should have a policy separating smoking areas from nonsmoking areas.  Smoking areas should have a system for ventilating and cleaning the air.  For children, the home may be the most dangerous place for exposure to secondhand smoke.  Children who live in apartment buildings may be at risk from smoke drifting from hallways or other people's homes.  For everyone, many public places are possible sources of exposure to secondhand smoke.  These places include restaurants, shopping centers, and parks. HOW CAN I REDUCE MY RISK FOR EXPOSURE TO SECONDHAND SMOKE? The most important thing you can do is not smoke. Discourage family members from smoking. Other ways to reduce exposure for you and your family include the following:  Keep your home smoke free.  Make sure your child care providers do not smoke.  Warn your child about the dangers of smoking and secondhand smoke.  Do not allow smoking in your car. When someone smokes in a car, all the damaging chemicals from the smoke are confined in a small area.  Avoid public places where smoking is allowed.   This information is not intended to replace advice given to you by your health care provider. Make sure you discuss any questions you have with your health care provider.   Document Released: 09/21/2004 Document Revised: 09/04/2014  Document Reviewed: 11/28/2013 Elsevier Interactive Patient Education 2016 Elsevier Inc.  

## 2015-12-30 NOTE — Progress Notes (Signed)
Filed Vitals:   12/30/15 1240 12/30/15 1245  BP: 156/85 150/96  Pulse: 61   Temp: 97.6 F (36.4 C)   TempSrc: Oral   Resp: 20   Height: 5\' 6"  (1.676 m)   Weight: 120 lb 11.2 oz (54.749 kg)

## 2016-02-14 NOTE — Addendum Note (Signed)
Addended by: Sharee Pimple on: 02/14/2016 05:01 PM   Modules accepted: Orders

## 2016-05-03 ENCOUNTER — Ambulatory Visit (INDEPENDENT_AMBULATORY_CARE_PROVIDER_SITE_OTHER): Payer: Medicare Other | Admitting: Internal Medicine

## 2016-05-03 ENCOUNTER — Encounter: Payer: Self-pay | Admitting: Internal Medicine

## 2016-05-03 VITALS — BP 162/84 | HR 89 | Ht 66.0 in | Wt 119.0 lb

## 2016-05-03 DIAGNOSIS — I739 Peripheral vascular disease, unspecified: Secondary | ICD-10-CM

## 2016-05-03 DIAGNOSIS — I2510 Atherosclerotic heart disease of native coronary artery without angina pectoris: Secondary | ICD-10-CM | POA: Diagnosis not present

## 2016-05-03 DIAGNOSIS — I480 Paroxysmal atrial fibrillation: Secondary | ICD-10-CM

## 2016-05-03 DIAGNOSIS — I10 Essential (primary) hypertension: Secondary | ICD-10-CM | POA: Diagnosis not present

## 2016-05-03 DIAGNOSIS — I495 Sick sinus syndrome: Secondary | ICD-10-CM

## 2016-05-03 LAB — CUP PACEART INCLINIC DEVICE CHECK
Battery Voltage: 3.3 V
Brady Statistic RV Percent Paced: 0 %
Date Time Interrogation Session: 20170906163005
Lead Channel Setting Sensing Sensitivity: 2 mV
MDC IDC MSMT LEADCHNL RV PACING THRESHOLD AMPLITUDE: 0.75 V
MDC IDC MSMT LEADCHNL RV PACING THRESHOLD PULSEWIDTH: 0.4 ms
MDC IDC MSMT LEADCHNL RV SENSING INTR AMPL: 12 mV
MDC IDC SET LEADCHNL RV PACING AMPLITUDE: 2 V
MDC IDC SET LEADCHNL RV PACING PULSEWIDTH: 0.4 ms

## 2016-05-03 NOTE — Progress Notes (Signed)
Electrophysiology Office Note   Date:  05/03/2016   ID:  Mercedes [name], DOB [birth year], MRN [id number]  PCP:  No PCP Per Patient  Cardiologist: Dr. Jens Som Primary Electrophysiologist: Dr. Johney Frame    Chief Complaint  Patient presents with  . Atrial Fibrillation     History of Present Illness: Mercedes [name] is a 60 y.o. female who presents today for electrophysiology evaluation .   She is doing well.  No concerns today.  Bp is elevated.  She does not check her BP at home. Today, she denies symptoms of palpitations, chest pain, shortness of breath, orthopnea, PND, lower extremity edema, claudication, dizziness, presyncope, syncope, bleeding, or neurologic sequela.    Past Medical History:  Diagnosis Date  . Abnormal LFTs    08/2013 Lipitor 40 d/c'd and crestor 10 started   . Anemia    as a young woman  . Anxiety   . Arnold-Chiari malformation, type I (HCC)    MRI brain 02/2010  . CAD (coronary artery disease)    a. NSTEMI 07/2009: LHC - D2 40%, LAD irreg., EF 50% with apical AK (tako-tsubo CM);  b. Inf STEMI (04/2013): Tx Promus DES to CFX;  c. 08/2013 Lexi CL: No ischemia, dist ant/ap/inf-ap infarct, EF   . DEPRESSION   . Dizziness    ? CVA 01/2010 - carotid dopplers with no ICA stenosis  . GERD (gastroesophageal reflux disease)   . HYPERLIPIDEMIA   . HYPERTENSION   . PAD (peripheral artery disease) (HCC)    s/p L fem pop 11/2013 in setting of 1st toe osteo/gangrene  . Paroxysmal atrial fibrillation (HCC)    a. anticoagulated with Eliquis 10/2014  . RA (rheumatoid arthritis) (HCC)    prior tx by Dr. Dareen Piano  . Sjogren's syndrome (HCC) 06/02/13   pt denies this (12/01/13)  . SLE (systemic lupus erythematosus) (HCC) dx 05/2013   follows with rheum Dareen Piano)  . Tachy-brady syndrome (HCC)    a. s/p STJ Leadless pacemaker 11-04-2014 Dr Johney Frame  . 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 ABDOMINAL AORTAGRAM N/A 11/24/2013   Procedure: ABDOMINAL AORTAGRAM WITH BILATERAL RUNOFF WITH POSSIBLE INTERVENTION;  Surgeon: Chuck Hint, MD;  Location: [facility] CATH LAB;  Service: Cardiovascular;  Laterality: N/A;  . AMPUTATION Left 12/02/2013   Procedure: LEFT 1ST TOE AMPUTATION;  Surgeon: Sherren Kerns, MD;  Location: [facility] OR;  Service: Vascular;  Laterality: Left;  . COLONOSCOPY    . CORONARY ANGIOPLASTY  04/2013  . FEMORAL-POPLITEAL BYPASS GRAFT Left 12/02/2013   Procedure:  LEFT FEMORAL-BELOW KNEE POPLITEAL ARTERY BYPASS GRAFT;  Surgeon: Sherren Kerns, MD;  Location: [facility] OR;  Service: Vascular;  Laterality: Left;  . LEFT HEART CATHETERIZATION WITH CORONARY ANGIOGRAM N/A 05/19/2013   Procedure: LEFT HEART CATHETERIZATION WITH CORONARY ANGIOGRAM;  Surgeon: Micheline Chapman, MD;  Location: [facility] CATH LAB;  Service: Cardiovascular;  Laterality: N/A;  . LEFT HEART CATHETERIZATION WITH CORONARY ANGIOGRAM N/A 10/28/2014   Procedure: LEFT HEART CATHETERIZATION WITH CORONARY ANGIOGRAM;  Surgeon: Iran Ouch, MD;  Location: [facility] CATH LAB;  Service: Cardiovascular;  Laterality: N/A;  . PERCUTANEOUS CORONARY STENT INTERVENTION (PCI-S)  05/19/2013   Procedure: PERCUTANEOUS CORONARY STENT INTERVENTION (PCI-S);  Surgeon: Micheline Chapman, MD;  Location: [facility] CATH LAB;  Service: Cardiovascular;;  . PERMANENT PACEMAKER INSERTION N/A 11/04/2014   STJ Leadless pacemaker implanted by Dr Johney Frame  for tachy/brady  . TUBAL LIGATION       Current Outpatient Prescriptions  Medication Sig Dispense Refill  . apixaban (ELIQUIS) 5 MG TABS tablet Take 1 tablet (5 mg total) by mouth 2 (two) times daily. 180 tablet 1  . aspirin EC 81 MG tablet Take 1 tablet (81 mg total) by mouth daily.    [name] gabapentin (NEURONTIN) 100 MG capsule Take 1-3 capsules at bedtime as needed for phantom pain 40 capsule 2  . metoprolol succinate (TOPROL-XL) 25 MG 24 hr tablet Take 1 tablet (25 mg total) by  mouth daily. 90 tablet 3  . nitroGLYCERIN (NITROSTAT) 0.4 MG SL tablet Place 1 tablet (0.4 mg total) under the tongue every 5 (five) minutes as needed for chest pain (MAX 3 TABLETS). 25 tablet 3  . rosuvastatin (CRESTOR) 40 MG tablet Take 1 tablet (40 mg total) by mouth daily. 90 tablet 3  . lisinopril (PRINIVIL,ZESTRIL) 10 MG tablet Take 1 tablet (10 mg total) by mouth daily. (Patient not taking: Reported on 05/03/2016) 90 tablet 3   No current facility-administered medications for this visit.     Allergies:   Brilinta [ticagrelor]   Social History:  The patient  reports that she quit smoking about 2 years ago. Her smoking use included Cigarettes. She has never used smokeless tobacco. She reports that she drinks alcohol. She reports that she does not use drugs.   Family History:  The patient's family history includes Arthritis in her father and mother; COPD in her father; Diabetes in her other; Emphysema in her mother.    ROS:  Please see the history of present illness.   All other systems are reviewed and negative.    PHYSICAL EXAM: VS:  BP (!) 162/84   Pulse 89   Ht 5\' 6"  (1.676 m)   Wt 119 lb (54 kg)   SpO2 97%   BMI 19.21 kg/m  , BMI Body mass index is 19.21 kg/m. GEN: Well nourished, well developed, in no acute distress  HEENT: normal  Neck: no JVD, carotid bruits, or masses Cardiac: RRR; no murmurs, rubs, or gallops,no edema  Respiratory:  clear to auscultation bilaterally, normal work of breathing GI: soft, nontender, nondistended, + BS MS: no deformity or atrophy  Skin: warm and dry  Neuro:  Strength and sensation are intact Psych: euthymic mood, full affect  EKG:   The ekg ordered today SR 89 bpm, incomplete RBBB, LAD, PVCs  Lipid Panel     Component Value Date/Time   CHOL 158 02/01/2015 0912   TRIG 63.0 02/01/2015 0912   HDL 62.40 02/01/2015 0912   CHOLHDL 3 02/01/2015 0912   VLDL 12.6 02/01/2015 0912   LDLCALC 83 02/01/2015 0912     Wt Readings from Last  3 Encounters:  05/03/16 119 lb (54 kg)  12/30/15 120 lb 11.2 oz (54.7 kg)  10/26/15 121 lb (54.9 kg)     ASSESSMENT AND PLAN:  1.  Tachy/brady syndrome     Normal leadless pacemaker function     See Pace Art report     No changes today  2. PAF     Eliquis     Bmet, cbc ordered today  3. CAD     Last PCI was 2014     Would consider stopping ASA if ok with vascular (she sees them in November)  4. HTN     elevated     She declines medical changes today    BMET ordered  5. HL  Fasting lipids and LFTs ordered    Return to see EP PA in 6 months I will see in a year  Signed, Hillis Range, MD  05/03/2016 4:18 PM     [facility] [address] [place] [address] [phone] (office) [phone] (fax)

## 2016-05-03 NOTE — Patient Instructions (Signed)
Medication Instructions:  Your physician recommends that you continue on your current medications as directed. Please refer to the Current Medication list given to you today.   Labwork: Your physician recommends that you return for lab work in: next week fasting labs Lipid Panel, Liver, BMP, CBC   Testing/Procedures: None ordered  Follow-Up: Your physician wants you to follow-up in: 6 months with [name], NP and 12 months with Dr. Johney Frame. You will receive a reminder letter in the mail two months in advance. If you don't receive a letter, please call our office to schedule the follow-up appointment.      If you need a refill on your cardiac medications before your next appointment, please call your pharmacy.

## 2016-05-10 ENCOUNTER — Other Ambulatory Visit: Payer: Medicare Other | Admitting: *Deleted

## 2016-05-10 DIAGNOSIS — I2510 Atherosclerotic heart disease of native coronary artery without angina pectoris: Secondary | ICD-10-CM | POA: Diagnosis not present

## 2016-05-10 LAB — CBC WITH DIFFERENTIAL/PLATELET
Basophils Absolute: 120 cells/uL (ref 0–200)
Basophils Relative: 2 %
EOS PCT: 5 %
Eosinophils Absolute: 300 cells/uL (ref 15–500)
HCT: 41.9 % (ref 35.0–45.0)
Hemoglobin: 13.7 g/dL (ref 11.7–15.5)
LYMPHS PCT: 40 %
Lymphs Abs: 2400 cells/uL (ref 850–3900)
MCH: 25.7 pg — ABNORMAL LOW (ref 27.0–33.0)
MCHC: 32.7 g/dL (ref 32.0–36.0)
MCV: 78.5 fL — ABNORMAL LOW (ref 80.0–100.0)
MPV: 8.6 fL (ref 7.5–12.5)
Monocytes Absolute: 540 cells/uL (ref 200–950)
Monocytes Relative: 9 %
NEUTROS PCT: 44 %
Neutro Abs: 2640 cells/uL (ref 1500–7800)
PLATELETS: 347 10*3/uL (ref 140–400)
RBC: 5.34 MIL/uL — AB (ref 3.80–5.10)
RDW: 15.1 % — AB (ref 11.0–15.0)
WBC: 6 10*3/uL (ref 3.8–10.8)

## 2016-05-10 LAB — HEPATIC FUNCTION PANEL
ALT: 8 U/L (ref 6–29)
AST: 13 U/L (ref 10–35)
Albumin: 3.6 g/dL (ref 3.6–5.1)
Alkaline Phosphatase: 92 U/L (ref 33–130)
BILIRUBIN DIRECT: 0.1 mg/dL (ref <=0.2)
BILIRUBIN INDIRECT: 0.2 mg/dL (ref 0.2–1.2)
TOTAL PROTEIN: 7.5 g/dL (ref 6.1–8.1)
Total Bilirubin: 0.3 mg/dL (ref 0.2–1.2)

## 2016-05-10 LAB — BASIC METABOLIC PANEL
BUN: 10 mg/dL (ref 7–25)
CALCIUM: 9.2 mg/dL (ref 8.6–10.4)
CO2: 24 mmol/L (ref 20–31)
Chloride: 106 mmol/L (ref 98–110)
Creat: 0.82 mg/dL (ref 0.50–0.99)
GLUCOSE: 96 mg/dL (ref 65–99)
Potassium: 4.1 mmol/L (ref 3.5–5.3)
Sodium: 137 mmol/L (ref 135–146)

## 2016-05-10 LAB — LIPID PANEL
CHOLESTEROL: 149 mg/dL (ref 125–200)
HDL: 54 mg/dL (ref >=46)
LDL Cholesterol: 63 mg/dL (ref <130)
TRIGLYCERIDES: 161 mg/dL — AB (ref <150)
Total CHOL/HDL Ratio: 2.8 Ratio (ref <=5.0)
VLDL: 32 mg/dL — ABNORMAL HIGH (ref <30)

## 2016-07-07 ENCOUNTER — Encounter: Payer: Self-pay | Admitting: Family

## 2016-07-11 ENCOUNTER — Emergency Department (HOSPITAL_COMMUNITY): Payer: Medicare Other

## 2016-07-11 ENCOUNTER — Inpatient Hospital Stay (HOSPITAL_COMMUNITY)
Admission: EM | Admit: 2016-07-11 | Discharge: 2016-07-24 | DRG: 234 | Disposition: A | Discharge status: Skilled Nursing Facility | Payer: Medicare Other | Attending: Cardiothoracic Surgery | Admitting: Cardiothoracic Surgery

## 2016-07-11 ENCOUNTER — Encounter (HOSPITAL_COMMUNITY): Payer: Self-pay

## 2016-07-11 DIAGNOSIS — I10 Essential (primary) hypertension: Secondary | ICD-10-CM | POA: Diagnosis present

## 2016-07-11 DIAGNOSIS — Z955 Presence of coronary angioplasty implant and graft: Secondary | ICD-10-CM

## 2016-07-11 DIAGNOSIS — I480 Paroxysmal atrial fibrillation: Secondary | ICD-10-CM

## 2016-07-11 DIAGNOSIS — I4892 Unspecified atrial flutter: Secondary | ICD-10-CM | POA: Diagnosis present

## 2016-07-11 DIAGNOSIS — Z9114 Patient's other noncompliance with medication regimen: Secondary | ICD-10-CM | POA: Diagnosis not present

## 2016-07-11 DIAGNOSIS — Z825 Family history of asthma and other chronic lower respiratory diseases: Secondary | ICD-10-CM

## 2016-07-11 DIAGNOSIS — E872 Acidosis: Secondary | ICD-10-CM | POA: Diagnosis not present

## 2016-07-11 DIAGNOSIS — K219 Gastro-esophageal reflux disease without esophagitis: Secondary | ICD-10-CM | POA: Diagnosis present

## 2016-07-11 DIAGNOSIS — M3500 Sicca syndrome, unspecified: Secondary | ICD-10-CM | POA: Diagnosis present

## 2016-07-11 DIAGNOSIS — M069 Rheumatoid arthritis, unspecified: Secondary | ICD-10-CM | POA: Diagnosis present

## 2016-07-11 DIAGNOSIS — Z951 Presence of aortocoronary bypass graft: Secondary | ICD-10-CM

## 2016-07-11 DIAGNOSIS — Z833 Family history of diabetes mellitus: Secondary | ICD-10-CM | POA: Diagnosis not present

## 2016-07-11 DIAGNOSIS — Z7901 Long term (current) use of anticoagulants: Secondary | ICD-10-CM

## 2016-07-11 DIAGNOSIS — R079 Chest pain, unspecified: Secondary | ICD-10-CM | POA: Diagnosis not present

## 2016-07-11 DIAGNOSIS — Z79899 Other long term (current) drug therapy: Secondary | ICD-10-CM

## 2016-07-11 DIAGNOSIS — F329 Major depressive disorder, single episode, unspecified: Secondary | ICD-10-CM | POA: Diagnosis present

## 2016-07-11 DIAGNOSIS — I252 Old myocardial infarction: Secondary | ICD-10-CM

## 2016-07-11 DIAGNOSIS — D62 Acute posthemorrhagic anemia: Secondary | ICD-10-CM | POA: Diagnosis not present

## 2016-07-11 DIAGNOSIS — Z7982 Long term (current) use of aspirin: Secondary | ICD-10-CM | POA: Diagnosis not present

## 2016-07-11 DIAGNOSIS — E1151 Type 2 diabetes mellitus with diabetic peripheral angiopathy without gangrene: Secondary | ICD-10-CM | POA: Diagnosis present

## 2016-07-11 DIAGNOSIS — I25110 Atherosclerotic heart disease of native coronary artery with unstable angina pectoris: Secondary | ICD-10-CM | POA: Diagnosis not present

## 2016-07-11 DIAGNOSIS — Z950 Presence of cardiac pacemaker: Secondary | ICD-10-CM

## 2016-07-11 DIAGNOSIS — Z87891 Personal history of nicotine dependence: Secondary | ICD-10-CM

## 2016-07-11 DIAGNOSIS — I4891 Unspecified atrial fibrillation: Secondary | ICD-10-CM | POA: Diagnosis not present

## 2016-07-11 DIAGNOSIS — K5900 Constipation, unspecified: Secondary | ICD-10-CM | POA: Diagnosis not present

## 2016-07-11 DIAGNOSIS — I495 Sick sinus syndrome: Secondary | ICD-10-CM | POA: Diagnosis not present

## 2016-07-11 DIAGNOSIS — Z8261 Family history of arthritis: Secondary | ICD-10-CM | POA: Diagnosis not present

## 2016-07-11 DIAGNOSIS — E1169 Type 2 diabetes mellitus with other specified complication: Secondary | ICD-10-CM | POA: Diagnosis present

## 2016-07-11 DIAGNOSIS — I739 Peripheral vascular disease, unspecified: Secondary | ICD-10-CM | POA: Diagnosis not present

## 2016-07-11 DIAGNOSIS — E785 Hyperlipidemia, unspecified: Secondary | ICD-10-CM | POA: Diagnosis present

## 2016-07-11 DIAGNOSIS — I2510 Atherosclerotic heart disease of native coronary artery without angina pectoris: Secondary | ICD-10-CM

## 2016-07-11 DIAGNOSIS — Z9861 Coronary angioplasty status: Secondary | ICD-10-CM

## 2016-07-11 DIAGNOSIS — E8770 Fluid overload, unspecified: Secondary | ICD-10-CM | POA: Diagnosis not present

## 2016-07-11 DIAGNOSIS — I498 Other specified cardiac arrhythmias: Secondary | ICD-10-CM | POA: Diagnosis not present

## 2016-07-11 DIAGNOSIS — I200 Unstable angina: Secondary | ICD-10-CM | POA: Diagnosis not present

## 2016-07-11 DIAGNOSIS — Z01810 Encounter for preprocedural cardiovascular examination: Secondary | ICD-10-CM | POA: Diagnosis not present

## 2016-07-11 DIAGNOSIS — I214 Non-ST elevation (NSTEMI) myocardial infarction: Principal | ICD-10-CM | POA: Diagnosis present

## 2016-07-11 DIAGNOSIS — M329 Systemic lupus erythematosus, unspecified: Secondary | ICD-10-CM | POA: Diagnosis present

## 2016-07-11 HISTORY — DX: Acute myocardial infarction, unspecified: I21.9

## 2016-07-11 LAB — CBC
HCT: 42.6 % (ref 36.0–46.0)
HEMOGLOBIN: 13.9 g/dL (ref 12.0–15.0)
MCH: 25.6 pg — ABNORMAL LOW (ref 26.0–34.0)
MCHC: 32.6 g/dL (ref 30.0–36.0)
MCV: 78.6 fL (ref 78.0–100.0)
Platelets: 290 10*3/uL (ref 150–400)
RBC: 5.42 MIL/uL — AB (ref 3.87–5.11)
RDW: 15.3 % (ref 11.5–15.5)
WBC: 4.1 10*3/uL (ref 4.0–10.5)

## 2016-07-11 LAB — BASIC METABOLIC PANEL
ANION GAP: 7 (ref 5–15)
BUN: 7 mg/dL (ref 6–20)
CALCIUM: 9.2 mg/dL (ref 8.9–10.3)
CO2: 24 mmol/L (ref 22–32)
Chloride: 104 mmol/L (ref 101–111)
Creatinine, Ser: 0.94 mg/dL (ref 0.44–1.00)
Glucose, Bld: 104 mg/dL — ABNORMAL HIGH (ref 65–99)
Potassium: 4.2 mmol/L (ref 3.5–5.1)
Sodium: 135 mmol/L (ref 135–145)

## 2016-07-11 LAB — TROPONIN I: TROPONIN I: 0.47 ng/mL — AB (ref <0.03)

## 2016-07-11 LAB — I-STAT TROPONIN, ED: TROPONIN I, POC: 0.82 ng/mL — AB (ref 0.00–0.08)

## 2016-07-11 LAB — HEPARIN LEVEL (UNFRACTIONATED): Heparin Unfractionated: <0.1 IU/mL — ABNORMAL LOW (ref 0.30–0.70)

## 2016-07-11 LAB — APTT: aPTT: 28 seconds (ref 24–36)

## 2016-07-11 LAB — LIPASE, BLOOD: LIPASE: 35 U/L (ref 11–51)

## 2016-07-11 MED ORDER — ONDANSETRON HCL 4 MG/2ML IJ SOLN: 4.0000 mg | Freq: Four times a day (QID) | INTRAMUSCULAR | Status: DC | PRN

## 2016-07-11 MED ORDER — ROSUVASTATIN CALCIUM 20 MG PO TABS
40.0000 mg | ORAL_TABLET | Freq: Every day | ORAL | Status: DC
  Administered 2016-07-13 – 2016-07-16 (×3): 40 mg via ORAL
  Filled 2016-07-11 (×3): qty 2

## 2016-07-11 MED ORDER — NITROGLYCERIN IN D5W 200-5 MCG/ML-% IV SOLN
2.0000 ug/min | INTRAVENOUS | Status: DC
  Administered 2016-07-11: 2 ug/min via INTRAVENOUS
  Filled 2016-07-11: qty 250

## 2016-07-11 MED ORDER — SODIUM CHLORIDE 0.9% FLUSH
3.0000 mL | Freq: Two times a day (BID) | INTRAVENOUS | Status: DC
  Administered 2016-07-12: 3 mL via INTRAVENOUS

## 2016-07-11 MED ORDER — SODIUM CHLORIDE 0.9% FLUSH: 3.0000 mL | INTRAVENOUS | Status: DC | PRN

## 2016-07-11 MED ORDER — ASPIRIN EC 81 MG PO TBEC: 81.0000 mg | DELAYED_RELEASE_TABLET | Freq: Every day | ORAL | Status: DC

## 2016-07-11 MED ORDER — SODIUM CHLORIDE 0.9 % IV SOLN: 250.0000 mL | INTRAVENOUS | Status: DC | PRN

## 2016-07-11 MED ORDER — NITROGLYCERIN 0.4 MG/SPRAY TL SOLN: 1.0000 | Status: DC | PRN

## 2016-07-11 MED ORDER — SODIUM CHLORIDE 0.9 % WEIGHT BASED INFUSION: 1.0000 mL/kg/h | INTRAVENOUS | Status: DC

## 2016-07-11 MED ORDER — ACETAMINOPHEN 325 MG PO TABS
650.0000 mg | ORAL_TABLET | ORAL | Status: DC | PRN
  Administered 2016-07-12: 650 mg via ORAL
  Filled 2016-07-11: qty 2

## 2016-07-11 MED ORDER — NITROGLYCERIN 0.4 MG SL SUBL: 0.4000 mg | SUBLINGUAL_TABLET | SUBLINGUAL | Status: DC | PRN

## 2016-07-11 MED ORDER — LISINOPRIL 10 MG PO TABS
10.0000 mg | ORAL_TABLET | Freq: Every day | ORAL | Status: DC
  Administered 2016-07-13 – 2016-07-15 (×2): 10 mg via ORAL
  Filled 2016-07-11 (×3): qty 1

## 2016-07-11 MED ORDER — HEPARIN BOLUS VIA INFUSION
2500.0000 [IU] | Freq: Once | INTRAVENOUS | Status: AC
  Administered 2016-07-11: 2500 [IU] via INTRAVENOUS
  Filled 2016-07-11: qty 2500

## 2016-07-11 MED ORDER — ASPIRIN 81 MG PO CHEW
81.0000 mg | CHEWABLE_TABLET | ORAL | Status: AC
  Administered 2016-07-12: 81 mg via ORAL
  Filled 2016-07-11: qty 1

## 2016-07-11 MED ORDER — SODIUM CHLORIDE 0.9 % WEIGHT BASED INFUSION: 3.0000 mL/kg/h | INTRAVENOUS | Status: AC

## 2016-07-11 MED ORDER — HEPARIN (PORCINE) IN NACL 100-0.45 UNIT/ML-% IJ SOLN
650.0000 [IU]/h | INTRAMUSCULAR | Status: DC
  Administered 2016-07-11: 650 [IU]/h via INTRAVENOUS
  Filled 2016-07-11: qty 250

## 2016-07-11 MED ORDER — NITROGLYCERIN 0.4 MG SL SUBL
0.4000 mg | SUBLINGUAL_TABLET | SUBLINGUAL | Status: DC | PRN
  Administered 2016-07-11 (×2): 0.4 mg via SUBLINGUAL
  Filled 2016-07-11 (×2): qty 1

## 2016-07-11 MED ORDER — ASPIRIN EC 81 MG PO TBEC
81.0000 mg | DELAYED_RELEASE_TABLET | Freq: Every day | ORAL | Status: DC
  Administered 2016-07-13 – 2016-07-16 (×3): 81 mg via ORAL
  Filled 2016-07-11 (×3): qty 1

## 2016-07-11 MED ORDER — ASPIRIN 81 MG PO CHEW
324.0000 mg | CHEWABLE_TABLET | Freq: Once | ORAL | Status: AC
  Administered 2016-07-11: 324 mg via ORAL
  Filled 2016-07-11: qty 4

## 2016-07-11 NOTE — ED Notes (Signed)
EKG given to Dr. Lockwood. 

## 2016-07-11 NOTE — ED Notes (Signed)
Pt st's pain is now in her back, under left shoulder blade and left upper arm.  2nd NTG SL given.

## 2016-07-11 NOTE — Progress Notes (Signed)
ANTICOAGULATION CONSULT NOTE - Initial Consult  Pharmacy Consult for heparin Indication: chest pain/ACS  Allergies  Allergen Reactions  . Brilinta [Ticagrelor] Anaphylaxis    Also chest tightness    Patient Measurements:   Heparin Dosing Weight: 54 kg wt on 05/03/16  Vital Signs: Temp: 97.8 F (36.6 C) (11/14 1801) Temp Source: Oral (11/14 1801) BP: 132/79 (11/14 2015) Pulse Rate: 52 (11/14 2015)  Labs:  Recent Labs  07/11/16 1807  HGB 13.9  HCT 42.6  PLT 290  CREATININE 0.94  TROPONINI 0.47*    CrCl cannot be calculated (Unknown ideal weight.).   Medical History: Past Medical History:  Diagnosis Date  . Abnormal LFTs    08/2013 Lipitor 40 d/c'd and crestor 10 started   . Anemia    as a young woman  . Anxiety   . Arnold-Chiari malformation, type I (HCC)    MRI brain 02/2010  . CAD (coronary artery disease)    a. NSTEMI 07/2009: LHC - D2 40%, LAD irreg., EF 50% with apical AK (tako-tsubo CM);  b. Inf STEMI (04/2013): Tx Promus DES to CFX;  c. 08/2013 Lexi CL: No ischemia, dist ant/ap/inf-ap infarct, EF   . DEPRESSION   . Dizziness    ? CVA 01/2010 - carotid dopplers with no ICA stenosis  . GERD (gastroesophageal reflux disease)   . HYPERLIPIDEMIA   . HYPERTENSION   . PAD (peripheral artery disease) (HCC)    s/p L fem pop 11/2013 in setting of 1st toe osteo/gangrene  . Paroxysmal atrial fibrillation (HCC)    a. anticoagulated with Eliquis 10/2014  . RA (rheumatoid arthritis) (HCC)    prior tx by Dr. Dareen Piano  . Sjogren's syndrome (HCC) 06/02/13   pt denies this (12/01/13)  . SLE (systemic lupus erythematosus) (HCC) dx 05/2013   follows with rheum Dareen Piano)  . Tachy-brady syndrome (HCC)    a. s/p STJ Leadless pacemaker 11-04-2014 Dr Johney Frame  . 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  She is on Eliquis 5 mg  daily PTA with last dose 11/13 at 1200.  It has been > 24 hours since last dose in a drug that is usually BID dosing interval.   Wt 54 kg on 05/03/16.  Creat WNL, CBC WNL.  Troponin 0.47.  Given SL NTG x 2 doses in ED.   Goal of Therapy:  Heparin level 0.3-0.7 units/ml aPTT 66-102 seconds Monitor platelets by anticoagulation protocol: Yes   Plan: after baseline heparin level and aPTT are drawn: Give 2500 units bolus x 1 Start heparin infusion at 650 units/hr Check anti-Xa level in 6 hours and daily while on heparin Continue to monitor H&H and platelets  Check 6 hr aPTT since pt on Eliquis  Herby Abraham, Pharm.D. [phone] 07/11/2016 9:12 PM

## 2016-07-11 NOTE — ED Notes (Signed)
Pt rates chest pain a 6 on pain scale 0/10.  NTG 1 SL given for chest pain

## 2016-07-11 NOTE — ED Notes (Signed)
Pt c/o soreness in left chest.

## 2016-07-11 NOTE — ED Triage Notes (Signed)
Pt reports chest pain that radiates into her left arm. She has hx of MI. Pt states the pain could be related to gas. No distress noted.

## 2016-07-11 NOTE — ED Notes (Signed)
PT TAKEN BACK TO TREATMENT ROOM AND PLACED ON CARDIAC MONITOR MD AND RN MADE AWARE.

## 2016-07-11 NOTE — ED Provider Notes (Signed)
[facility]-EMERGENCY DEPT Provider Note   CSN: [id number] Arrival date & time: 07/11/16  1752     History   Chief Complaint Chief Complaint  Patient presents with  . Chest Pain    HPI Mercedes [name] is a 60 y.o. female.  Patient presents with one day of substernal chest pressure radiating to her arm and jaw. It initially occurred at rest and has been constant throughout the day. It it not exertional. It is not associated with nausea, vomiting, diaphoresis, or shortness of breath. She denies recent fevers or cough. She has a history of an MI in 2010 and in 2014 requiring stents placed in the circumflex artery. Last catheterization in 2014. Also has a history of Takutsubo's Cardiomyopathy several years ago. She does not take antiplatelet agents though is on Eliquis for paroxysmal atrial fibrillation.   The history is provided by the patient. No language interpreter was used.  Chest Pain   This is a new problem. The current episode started 3 to 5 hours ago. The problem occurs constantly. The problem has not changed since onset.The pain is associated with rest. The pain is present in the substernal region. The pain is at a severity of 8/10. The pain is moderate. The quality of the pain is described as pressure-like and heavy. The pain radiates to the left jaw and left arm. Duration of episode(s) is 6 hours. Pertinent negatives include no abdominal pain, no cough, no diaphoresis, no dizziness, no exertional chest pressure, no fever, no irregular heartbeat, no lower extremity edema, no malaise/fatigue, no nausea, no shortness of breath, no syncope, no vomiting and no weakness. She has tried nothing for the symptoms. The treatment provided no relief.  Her past medical history is significant for CHF, MI and PVD.  Pertinent negatives for past medical history include no PE.  Procedure history is positive for cardiac catheterization and echocardiogram.    Past Medical History:  Diagnosis Date  .  Abnormal LFTs    08/2013 Lipitor 40 d/c'd and crestor 10 started   . Anemia    as a young woman  . Anxiety   . Arnold-Chiari malformation, type I (HCC)    MRI brain 02/2010  . CAD (coronary artery disease)    a. NSTEMI 07/2009: LHC - D2 40%, LAD irreg., EF 50% with apical AK (tako-tsubo CM);  b. Inf STEMI (04/2013): Tx Promus DES to CFX;  c. 08/2013 Lexi CL: No ischemia, dist ant/ap/inf-ap infarct, EF   . DEPRESSION   . Dizziness    ? CVA 01/2010 - carotid dopplers with no ICA stenosis  . GERD (gastroesophageal reflux disease)   . HYPERLIPIDEMIA   . HYPERTENSION   . MI (myocardial infarction)   . PAD (peripheral artery disease) (HCC)    s/p L fem pop 11/2013 in setting of 1st toe osteo/gangrene  . Paroxysmal atrial fibrillation (HCC)    a. anticoagulated with Eliquis 10/2014  . RA (rheumatoid arthritis) (HCC)    prior tx by Dr. Dareen Piano  . Sjogren's syndrome (HCC) 06/02/13   pt denies this (12/01/13)  . SLE (systemic lupus erythematosus) (HCC) dx 05/2013   follows with rheum Dareen Piano)  . Tachy-brady syndrome (HCC)    a. s/p STJ Leadless pacemaker 11-04-2014 Dr Johney Frame  . 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 Chest pain 07/11/2016  . NSTEMI (non-ST elevated myocardial  infarction) (HCC) 07/11/2016  . History of arterial bypass of lower extremity 12/30/2015  . Aftercare following surgery of the circulatory system 12/30/2015  . Claudication of right lower extremity (HCC) 12/30/2015  . COPD exacerbation (HCC) 10/04/2015  . Phantom limb pain-Left great toe 04/29/2015  . Seizure (HCC)   . Atrial flutter with rapid ventricular response vs. SVT 10/27/2014    Class: Acute  . Noncompliance with medication regimen 10/27/2014  . 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 PAD (peripheral artery disease) (HCC) 12/02/2013  .  Unstable angina-jaw pain 09/03/2013  . Abnormal LFTs on Lipitor- changed to Crestor but pt not compliant 09/03/2013  . SLE (systemic lupus erythematosus) (HCC)   . CAD - S/P Takotsubo MI in 2000, CFX DES Sept 2014 05/25/2013  . Acute MI, inferior wall (HCC) 05/19/2013  . 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 Tachycardia-bradycardia (HCC) 03/09/2010  . DEPRESSION 11/02/2009  . PVD- s/p Lt [facility] April 2015 10/29/2009  . TOBACCO ABUSE 09/17/2009  . Dyslipidemia 08/26/2009  . Essential hypertension 08/26/2009  . CORONARY ATHEROSCLEROSIS NATIVE CORONARY ARTERY 08/26/2009  . 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 GERD 08/26/2009  . Rheumatoid arthritis(714.0) 08/26/2009    Past Surgical History:  Procedure Laterality Date  . ABDOMINAL AORTAGRAM N/A 11/24/2013   Procedure: ABDOMINAL AORTAGRAM WITH BILATERAL RUNOFF WITH POSSIBLE INTERVENTION;  Surgeon: Chuck Hint, MD;  Location: [facility] CATH LAB;  Service: Cardiovascular;  Laterality: N/A;  . AMPUTATION Left 12/02/2013   Procedure: LEFT 1ST TOE AMPUTATION;  Surgeon: Sherren Kerns, MD;  Location[facility] OR;  Service: Vascular;  Laterality: Left;  . COLONOSCOPY    . CORONARY ANGIOPLASTY  04/2013  . FEMORAL-POPLITEAL BYPASS GRAFT Left 12/02/2013   Procedure:  LEFT FEMORAL-BELOW KNEE POPLITEAL ARTERY BYPASS GRAFT;  Surgeon: Sherren Kerns, MD;  Location: [facility] OR;  Service: Vascular;  Laterality: Left;  . LEFT HEART CATHETERIZATION WITH CORONARY ANGIOGRAM N/A 05/19/2013   Procedure: LEFT HEART CATHETERIZATION WITH CORONARY ANGIOGRAM;  Surgeon: Micheline Chapman, MD;  Location: [facility] CATH LAB;  Service: Cardiovascular;  Laterality: N/A;  . LEFT HEART CATHETERIZATION WITH CORONARY ANGIOGRAM N/A 10/28/2014   Procedure: LEFT HEART CATHETERIZATION WITH CORONARY ANGIOGRAM;  Surgeon: Iran Ouch, MD;  Location: [facility] CATH LAB;  Service: Cardiovascular;  Laterality: N/A;  . PERCUTANEOUS CORONARY STENT INTERVENTION (PCI-S)  05/19/2013   Procedure:  PERCUTANEOUS CORONARY STENT INTERVENTION (PCI-S);  Surgeon: Micheline Chapman, MD;  Location: [facility] CATH LAB;  Service: Cardiovascular;;  . PERMANENT PACEMAKER INSERTION N/A 11/04/2014   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  . TUBAL LIGATION      OB History    No data available       Home Medications    Prior to Admission medications   Medication Sig Start Date End Date Taking? Authorizing Provider  apixaban (ELIQUIS) 5 MG TABS tablet Take 1 tablet (5 mg total) by mouth 2 (two) times daily. Patient taking differently: Take 5 mg by mouth daily.  08/13/15  Yes Lewayne Bunting, MD  aspirin EC 81 MG tablet Take 1 tablet (81 mg total) by mouth daily. 01/27/15  Yes Amber Caryl Bis, NP  ENSURE (ENSURE) Take 237 mLs by mouth daily.   Yes Historical Provider, MD  lisinopril (PRINIVIL,ZESTRIL) 10 MG tablet Take 1 tablet (10 mg total) by mouth daily. 06/29/15  Yes Lewayne Bunting, MD  nitroGLYCERIN (NITROSTAT) 0.4 MG SL tablet Place 1 tablet (0.4 mg total) under the tongue every  5 (five) minutes as needed for chest pain (MAX 3 TABLETS). 11/16/14  Yes Hillis Range, MD  rosuvastatin (CRESTOR) 40 MG tablet Take 1 tablet (40 mg total) by mouth daily. 06/29/15  Yes Lewayne Bunting, MD  gabapentin (NEURONTIN) 100 MG capsule Take 1-3 capsules at bedtime as needed for phantom pain Patient not taking: Reported on 07/11/2016 04/29/15   Carma Lair Nickel, NP  metoprolol succinate (TOPROL-XL) 25 MG 24 hr tablet Take 1 tablet (25 mg total) by mouth daily. Patient not taking: Reported on 07/11/2016 05/05/15   Hillis Range, MD    Family History Family History  Problem Relation Age of Onset  . Arthritis Mother   . Emphysema Mother   . Arthritis Father   . COPD Father   . Diabetes Other     Social History Social History  Substance Use Topics  . Smoking status: Former Smoker    Types: Cigarettes    Quit date: 05/19/2013  . Smokeless tobacco: Never Used     Comment:    . Alcohol use 0.0  oz/week     Allergies   Brilinta [ticagrelor]   Review of Systems Review of Systems  Constitutional: Negative for diaphoresis, fever and malaise/fatigue.  HENT: Negative.   Respiratory: Negative for cough and shortness of breath.   Cardiovascular: Positive for chest pain. Negative for syncope.  Gastrointestinal: Negative for abdominal pain, nausea and vomiting.  Genitourinary: Negative.   Musculoskeletal: Negative.   Skin: Negative.   Allergic/Immunologic: Negative for immunocompromised state.  Neurological: Negative for dizziness and weakness.  Hematological: Bruises/bleeds easily.  Psychiatric/Behavioral: Negative.      Physical Exam Updated Vital Signs BP 142/87   Pulse (!) 50   Temp 97.8 F (36.6 C) (Oral)   Resp 15   SpO2 97%   Physical Exam  Constitutional: She is oriented to person, place, and time. She appears well-developed and well-nourished. No distress.  HENT:  Head: Normocephalic and atraumatic.  Eyes: Conjunctivae and EOM are normal.  Neck: Normal range of motion. Neck supple.  Cardiovascular: Normal rate, regular rhythm, normal heart sounds and intact distal pulses.  Exam reveals no gallop and no friction rub.   No murmur heard. Pulmonary/Chest: Effort normal and breath sounds normal. No respiratory distress. She has no wheezes. She has no rales.  Abdominal: Soft. She exhibits no distension. There is tenderness in the epigastric area. There is no rebound and no guarding.  Neurological: She is alert and oriented to person, place, and time.  Skin: Skin is warm and dry. She is not diaphoretic.  Psychiatric: She has a normal mood and affect. Her behavior is normal. Judgment and thought content normal.     ED Treatments / Results  Labs (all labs ordered are listed, but only abnormal results are displayed) Labs Reviewed  BASIC METABOLIC PANEL - Abnormal; Notable for the following:       Result Value   Glucose, Bld 104 (*)    All other components  within normal limits  CBC - Abnormal; Notable for the following:    RBC 5.42 (*)    MCH 25.6 (*)    All other components within normal limits  TROPONIN I - Abnormal; Notable for the following:    Troponin I 0.47 (*)    All other components within normal limits  HEPARIN LEVEL (UNFRACTIONATED) - Abnormal; Notable for the following:    Heparin Unfractionated <0.10 (*)    All other components within normal limits  I-STAT TROPOININ, ED - Abnormal; Notable  for the following:    Troponin i, poc 0.82 (*)    All other components within normal limits  LIPASE, BLOOD  APTT  RAPID URINE DRUG SCREEN, HOSP PERFORMED  RAPID URINE DRUG SCREEN, HOSP PERFORMED  APTT  HEPARIN LEVEL (UNFRACTIONATED)  CBC    EKG  EKG Interpretation  Date/Time:  Tuesday July 11 2016 20:23:12 EST Ventricular Rate:  53 PR Interval:    QRS Duration: 103 QT Interval:  486 QTC Calculation: 457 R Axis:   -29 Text Interpretation:  Sinus rhythm Borderline left axis deviation T wave abnormality No significant change since last tracing Abnormal ekg Confirmed by Gerhard Munch  MD [phone]) on 07/11/2016 8:32:23 PM       Radiology Dg Chest 2 View  Result Date: 07/11/2016 CLINICAL DATA:  Chest pain radiating to LEFT arm. History of myocardial infarction. EXAM: CHEST  2 VIEW COMPARISON:  Chest radiograph October 04, 2015 FINDINGS: Cardiomediastinal silhouette is normal, mildly calcified aortic knob. Increased lung volumes, with mild chronic bronchitic changes. No pleural effusion or focal consolidation. No pneumothorax. Osseous structures are normal. IMPRESSION: Probable COPD, no acute cardiopulmonary process. Electronically Signed   By: Awilda Metro M.D.   On: 07/11/2016 18:21    Procedures Procedures (including critical care time)  Medications Ordered in ED Medications  nitroGLYCERIN (NITROSTAT) SL tablet 0.4 mg (0.4 mg Sublingual Given 07/11/16 2024)  heparin ADULT infusion 100 units/mL (25000 units/274mL  sodium chloride 0.45%) (650 Units/hr Intravenous New Bag/Given 07/11/16 2149)  nitroGLYCERIN 50 mg in dextrose 5 % 250 mL (0.2 mg/mL) infusion (2 mcg/min Intravenous New Bag/Given 07/11/16 2219)  aspirin chewable tablet 324 mg (324 mg Oral Given 07/11/16 1857)  heparin bolus via infusion 2,500 Units (2,500 Units Intravenous Bolus from Bag 07/11/16 2149)     Initial Impression / Assessment and Plan / ED Course  I have reviewed the triage vital signs and the nursing notes.  Pertinent labs & imaging results that were available during my care of the patient were reviewed by me and considered in my medical decision making (see chart for details).  Clinical Course     Patient presents with one day of chest pain found to have elevated troponin concerning for NSTEMI, along with slightly deeper T wave inversions compared to prior EKGs. She is high risk with previous stents in place. She is overall well-appearing and alert and oriented. Chest pain improved somewhat with nitro. Troponin found to be 0.45. She was given a full strength aspirin. Chest x-ray unremarkable, do not suspect pneumothorax or pulmonary edema as the cause of her symptoms. She has equal pulses without wide mediastinum and no pain radiating to her back, do not suspect dissection. Low concern for PE as she has no respiratory symptoms. Cardiology was consulted and she will be started on heparin and admitted to their service for further management.  Final Clinical Impressions(s) / ED Diagnoses   Final diagnoses:  NSTEMI (non-ST elevated myocardial infarction) [facility])  Chest pain, unspecified type    New Prescriptions New Prescriptions   No medications on file     Preston Fleeting, MD 07/11/16 2237    Gerhard Munch, MD 07/14/16 [phone]

## 2016-07-11 NOTE — H&P (Signed)
Physician History and Physical    Mercedes [name] MRN: [id number] DOB/AGE: December 24, 1955 60 y.o. Admit date: 07/11/2016  Primary Cardiologist: Dr. Jens Som  HPI: 60 yo with history of CAD, suspected Takotsubo event, paroxysmal atrial fibrillation, and PAD presents with NSTEMI.    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 LHC (05/19/13): mLAD 50, pD1 40-50, pCFX 75 (culprit), OM with 2 subbranches with abrupt cessation of flow suggestive of embolic event, and pRCA 50-60, mRCA 50-60; faint collaterals from RCA to dCFX; EF 55%, inferior AK. PCI: Promus premier (3.5x16 mm) DES to pCFX. No beta blocker was started due to bradycardia. Nuclear study in January of 2015 showed an ejection fraction of 58%. There was infarct but no ischemia.  ABIs April 2015 consistent with moderate arterial insufficiency bilaterally.  She had left fem-pop bypass in 4/15. Echocardiogram March 2016 showed normal LV function with EF 60-65%, mild left ventricular hypertrophy and grade 1 diastolic dysfunction. Diagnosed with atrial flutter/fibrillation in March 2016. 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 Cardiac catheterization in March 2016 showed a patent ostial left circumflex stent and no other obstructive coronary artery disease.    She had been doing fairly well until yesterday.  Yesterday am, she heard some "bad news" regarding a grand-son.  It shook her up a lot.  This morning, she woke up with moderate central chest tightness.  This lasted all day.  The pain eventually migrated to her left upper chest and left upper back.  She is still hurting. She came to the ER.  Pain has been continuous for hours now.  TnI was mildly elevated at 0.47.  ECG showed inferior and anterolateral new T wave inversions concerning for ischemia.  No palpitations.    PMH: 1. HTN 2. Hyperlipidemia 3. GERD 4. Arnold-Chiari malformation 5. 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  - Inferior STEMI 9/14: DES to proximal LCx.  - Cardiolite 1/15: No ischemia, apical inferior infarct.  - LHC (3/16): Patent LCx stent, nonobstructive disease. - Echo (3/16) with EF 60-65%.  6. Atrial fibrillation: Paroxysmal 7. Rheumatoid arthritis 8. PAD: Left fem-pop in 4/15.  9. 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   Current Facility-Administered Medications  Medication Dose Route Frequency Provider Last Rate Last Dose  . heparin ADULT infusion 100 units/mL (25000 units/222mL sodium chloride 0.45%)  650 Units/hr Intravenous Continuous Laurey Morale, MD      . heparin bolus via infusion 2,500 Units  2,500 Units Intravenous Once Laurey Morale, MD      . nitroGLYCERIN (NITROSTAT) SL tablet 0.4 mg  0.4 mg Sublingual Q5 min PRN Gerhard Munch, MD   0.4 mg at 07/11/16 2024   Current Outpatient Prescriptions  Medication Sig Dispense Refill  . apixaban (ELIQUIS) 5 MG TABS tablet Take 1 tablet (5 mg total) by mouth 2 (two) times daily. (Patient taking differently: Take 5 mg by mouth daily. ) 180 tablet 1  . aspirin EC 81 MG tablet Take 1 tablet (81 mg total) by mouth daily.    [name] ENSURE (ENSURE) Take 237 mLs by mouth daily.    [name] lisinopril (PRINIVIL,ZESTRIL) 10 MG tablet Take 1 tablet (10 mg total) by mouth daily. 90 tablet 3  . nitroGLYCERIN (NITROSTAT) 0.4 MG SL tablet Place 1 tablet (0.4 mg total) under the tongue every 5 (five) minutes as needed for chest pain (MAX 3 TABLETS). 25 tablet 3  . rosuvastatin (CRESTOR) 40  MG tablet Take 1 tablet (40 mg total) by mouth daily. 90 tablet 3  . gabapentin (NEURONTIN) 100 MG capsule Take 1-3 capsules at bedtime as needed for phantom pain (Patient not taking: Reported on 07/11/2016) 40 capsule 2  . metoprolol succinate (TOPROL-XL) 25 MG 24 hr tablet Take 1 tablet (25 mg total) by mouth daily. (Patient not taking: Reported on 07/11/2016) 90 tablet 3     Review of systems complete and  found to be negative unless listed above   Family History  Problem Relation Age of Onset  . Arthritis Mother   . Emphysema Mother   . Arthritis Father   . COPD Father   . Diabetes Other     Social History   Social History  . Marital status: Single    Spouse name: N/A  . Number of children: 3  . Years of education: N/A   Occupational History  . Not on file.   Social History Main Topics  . Smoking status: Former Smoker    Types: Cigarettes    Quit date: 05/19/2013  . Smokeless tobacco: Never Used     Comment:    . Alcohol use 0.0 oz/week  . Drug use: No  . Sexual activity: Not on file   Other Topics Concern  . Not on file   Social History Narrative   Lives alone.      (Not in a hospital admission)  Physical Exam: Blood pressure 132/79, pulse (!) 52, temperature 97.8 F (36.6 C), temperature source Oral, resp. rate 16, SpO2 98 %.  General: NAD Neck: No JVD, no thyromegaly or thyroid nodule.  Lungs: Clear to auscultation bilaterally with normal respiratory effort. CV: Nondisplaced PMI.  Heart regular S1/S2, no S3/S4, no murmur.  No peripheral edema.  No carotid bruit.  Normal pedal pulses.  Abdomen: Soft, nontender, no hepatosplenomegaly, no distention.  Skin: Intact without lesions or rashes.  Neurologic: Alert and oriented x 3.  Psych: Normal affect. Extremities: No clubbing or cyanosis.  HEENT: Normal.   Labs:   Lab Results  Component Value Date   WBC 4.1 07/11/2016   HGB 13.9 07/11/2016   HCT 42.6 07/11/2016   MCV 78.6 07/11/2016   PLT 290 07/11/2016    Recent Labs Lab 07/11/16 1807  NA 135  K 4.2  CL 104  CO2 24  BUN 7  CREATININE 0.94  CALCIUM 9.2  GLUCOSE 104*   Lab Results  Component Value Date   CKTOTAL 100 02/18/2010   CKMB 1.2 02/18/2010   TROPONINI 0.47 (HH) 07/11/2016    Radiology: - CXR: COPD  EKG: NSR, inferior and anterolateral T wave inversions concerning for ischemia.   ASSESSMENT AND PLAN: 60 yo with history of CAD,  suspected Takotsubo event, paroxysmal atrial fibrillation, and PAD presents with NSTEMI.  1. CAD: NSTEMI.  TnI elevated to 0.47.  She has had chest pain for hours.  She has new inferior and anterolateral T wave inversions.  This certainly could be true NSTEMI with plaque rupture given her known vascular disease.  However, she heard some very distressing bad news yesterday, so this very well could represent a stress (Takotsubo-type) cardiomyopathy.  Interestingly, she has a prior history of suspected Takotsubo cardiomyopathy.  - She will need cardiac catheterization and echocardiogram.  We discussed left heart cath, she understands risks/benefits and agrees to proceed with study.  - NTG gtt now (plenty of BP room).  Given ongoing chest pain, admit to step-down.  - Hold off on beta blocker  with bradycardia => HR upper 40s.  - Hold Eliquis for cath, last dose was this morning.  Heparin gtt per pharmacy.  - ASA 81 daily, continue statin.  2. Atrial fibrillation: Paroxysmal.  She is in NSR.  I will hold Eliquis for cath.  Would plan catheterization in afternoon tomorrow to allow Eliquis time to wash out. She will go on heparin gtt.  3. PAD: s/p left fem-pop.  4. RA, SLE: Patient carries both diagnoses.  She no longer sees a rheumatologist.  5. Tachy-brady syndrome: She has a Systems developer.   Signed: Marca Ancona 07/11/2016, 8:56 PM

## 2016-07-12 ENCOUNTER — Telehealth: Payer: Self-pay | Admitting: *Deleted

## 2016-07-12 ENCOUNTER — Encounter (HOSPITAL_COMMUNITY)
Admission: EM | Disposition: A | Discharge status: Skilled Nursing Facility | Payer: Self-pay | Source: Home / Self Care | Attending: Cardiothoracic Surgery

## 2016-07-12 DIAGNOSIS — I480 Paroxysmal atrial fibrillation: Secondary | ICD-10-CM

## 2016-07-12 DIAGNOSIS — I739 Peripheral vascular disease, unspecified: Secondary | ICD-10-CM

## 2016-07-12 DIAGNOSIS — E785 Hyperlipidemia, unspecified: Secondary | ICD-10-CM

## 2016-07-12 DIAGNOSIS — I495 Sick sinus syndrome: Secondary | ICD-10-CM

## 2016-07-12 DIAGNOSIS — I200 Unstable angina: Secondary | ICD-10-CM

## 2016-07-12 DIAGNOSIS — Z9861 Coronary angioplasty status: Secondary | ICD-10-CM

## 2016-07-12 DIAGNOSIS — I2510 Atherosclerotic heart disease of native coronary artery without angina pectoris: Secondary | ICD-10-CM

## 2016-07-12 HISTORY — PX: CARDIAC CATHETERIZATION: SHX172

## 2016-07-12 LAB — BASIC METABOLIC PANEL
ANION GAP: 5 (ref 5–15)
BUN: 7 mg/dL (ref 6–20)
CALCIUM: 9 mg/dL (ref 8.9–10.3)
CO2: 24 mmol/L (ref 22–32)
Chloride: 107 mmol/L (ref 101–111)
Creatinine, Ser: 0.84 mg/dL (ref 0.44–1.00)
GFR calc Af Amer: >60 mL/min (ref >60)
Glucose, Bld: 104 mg/dL — ABNORMAL HIGH (ref 65–99)
POTASSIUM: 3.9 mmol/L (ref 3.5–5.1)
SODIUM: 136 mmol/L (ref 135–145)

## 2016-07-12 LAB — CBC
HEMATOCRIT: 39.9 % (ref 36.0–46.0)
HEMATOCRIT: 37.3 % (ref 36.0–46.0)
HEMOGLOBIN: 13 g/dL (ref 12.0–15.0)
HEMOGLOBIN: 12.1 g/dL (ref 12.0–15.0)
MCH: 25.5 pg — ABNORMAL LOW (ref 26.0–34.0)
MCH: 25.2 pg — ABNORMAL LOW (ref 26.0–34.0)
MCHC: 32.6 g/dL (ref 30.0–36.0)
MCHC: 32.4 g/dL (ref 30.0–36.0)
MCV: 78.2 fL (ref 78.0–100.0)
MCV: 77.7 fL — ABNORMAL LOW (ref 78.0–100.0)
Platelets: 263 10*3/uL (ref 150–400)
Platelets: 264 10*3/uL (ref 150–400)
RBC: 5.1 MIL/uL (ref 3.87–5.11)
RBC: 4.8 MIL/uL (ref 3.87–5.11)
RDW: 14.9 % (ref 11.5–15.5)
RDW: 14.9 % (ref 11.5–15.5)
WBC: 5.2 10*3/uL (ref 4.0–10.5)
WBC: 5.5 10*3/uL (ref 4.0–10.5)

## 2016-07-12 LAB — HEPARIN LEVEL (UNFRACTIONATED)
HEPARIN UNFRACTIONATED: 0.37 [IU]/mL (ref 0.30–0.70)
Heparin Unfractionated: 0.42 IU/mL (ref 0.30–0.70)

## 2016-07-12 LAB — MRSA PCR SCREENING: MRSA BY PCR: NEGATIVE

## 2016-07-12 LAB — TROPONIN I
TROPONIN I: 2.28 ng/mL — AB (ref <0.03)
Troponin I: 2 ng/mL (ref <0.03)
Troponin I: 4.1 ng/mL (ref <0.03)

## 2016-07-12 LAB — RAPID URINE DRUG SCREEN, HOSP PERFORMED
AMPHETAMINES: NOT DETECTED
BENZODIAZEPINES: NOT DETECTED
Barbiturates: NOT DETECTED
COCAINE: NOT DETECTED
Opiates: NOT DETECTED
Tetrahydrocannabinol: POSITIVE — AB

## 2016-07-12 LAB — PROTIME-INR
INR: 1.1
PROTHROMBIN TIME: 14.3 s (ref 11.4–15.2)

## 2016-07-12 LAB — LIPID PANEL
Cholesterol: 146 mg/dL (ref 0–200)
HDL: 47 mg/dL (ref >40)
LDL Cholesterol: 86 mg/dL (ref 0–99)
Total CHOL/HDL Ratio: 3.1 RATIO
Triglycerides: 66 mg/dL (ref <150)
VLDL: 13 mg/dL (ref 0–40)

## 2016-07-12 SURGERY — LEFT HEART CATH AND CORONARY ANGIOGRAPHY
Anesthesia: LOCAL

## 2016-07-12 MED ORDER — HYDRALAZINE HCL 20 MG/ML IJ SOLN: 5.0000 mg | INTRAMUSCULAR | Status: DC | PRN

## 2016-07-12 MED ORDER — SODIUM CHLORIDE 0.9% FLUSH: 3.0000 mL | INTRAVENOUS | Status: DC | PRN

## 2016-07-12 MED ORDER — SODIUM CHLORIDE 0.9 % IV SOLN: 250.0000 mL | INTRAVENOUS | Status: DC | PRN

## 2016-07-12 MED ORDER — ONDANSETRON HCL 4 MG/2ML IJ SOLN: 4.0000 mg | Freq: Four times a day (QID) | INTRAMUSCULAR | Status: DC | PRN

## 2016-07-12 MED ORDER — SODIUM CHLORIDE 0.9% FLUSH: 3.0000 mL | Freq: Two times a day (BID) | INTRAVENOUS | Status: DC

## 2016-07-12 MED ORDER — HEPARIN SODIUM (PORCINE) 1000 UNIT/ML IJ SOLN
INTRAMUSCULAR | Status: DC | PRN
  Administered 2016-07-12: 3000 [IU] via INTRAVENOUS

## 2016-07-12 MED ORDER — SODIUM CHLORIDE 0.9 % IV SOLN
INTRAVENOUS | Status: DC
  Administered 2016-07-12 – 2016-07-13 (×2): via INTRAVENOUS

## 2016-07-12 MED ORDER — TIROFIBAN HCL IN NACL 5-0.9 MG/100ML-% IV SOLN
INTRAVENOUS | Status: DC | PRN
  Administered 2016-07-12: 0.15 ug/kg/min via INTRAVENOUS

## 2016-07-12 MED ORDER — ACETAMINOPHEN 325 MG PO TABS: 650.0000 mg | ORAL_TABLET | ORAL | Status: DC | PRN

## 2016-07-12 MED ORDER — HEPARIN SODIUM (PORCINE) 1000 UNIT/ML IJ SOLN
INTRAMUSCULAR | Status: AC
  Filled 2016-07-12: qty 1

## 2016-07-12 MED ORDER — ACETAMINOPHEN 325 MG PO TABS
650.0000 mg | ORAL_TABLET | ORAL | Status: DC | PRN
  Filled 2016-07-12: qty 2

## 2016-07-12 MED ORDER — SODIUM CHLORIDE 0.9% FLUSH
3.0000 mL | Freq: Two times a day (BID) | INTRAVENOUS | Status: DC
  Administered 2016-07-13 – 2016-07-15 (×3): 3 mL via INTRAVENOUS

## 2016-07-12 MED ORDER — TIROFIBAN (AGGRASTAT) BOLUS VIA INFUSION
INTRAVENOUS | Status: DC | PRN
  Administered 2016-07-12: 1367.5 ug via INTRAVENOUS

## 2016-07-12 MED ORDER — TIROFIBAN HCL IN NACL 5-0.9 MG/100ML-% IV SOLN
0.1500 ug/kg/min | INTRAVENOUS | Status: DC
  Administered 2016-07-12 – 2016-07-13 (×4): 0.15 ug/kg/min via INTRAVENOUS
  Filled 2016-07-12 (×4): qty 100

## 2016-07-12 MED ORDER — HEPARIN (PORCINE) IN NACL 2-0.9 UNIT/ML-% IJ SOLN
INTRAMUSCULAR | Status: DC | PRN
  Administered 2016-07-12: 1000 mL

## 2016-07-12 MED ORDER — SODIUM CHLORIDE 0.9% FLUSH
3.0000 mL | Freq: Two times a day (BID) | INTRAVENOUS | Status: DC
  Administered 2016-07-12 – 2016-07-13 (×2): 3 mL via INTRAVENOUS

## 2016-07-12 MED ORDER — ONDANSETRON HCL 4 MG/2ML IJ SOLN
4.0000 mg | Freq: Four times a day (QID) | INTRAMUSCULAR | Status: DC | PRN
Start: 2016-07-12 — End: 2016-07-14

## 2016-07-12 MED ORDER — MIDAZOLAM HCL 2 MG/2ML IJ SOLN
INTRAMUSCULAR | Status: DC | PRN
  Administered 2016-07-12: 2 mg via INTRAVENOUS

## 2016-07-12 MED ORDER — ATORVASTATIN CALCIUM 80 MG PO TABS: 80.0000 mg | ORAL_TABLET | Freq: Every day | ORAL | Status: DC

## 2016-07-12 MED ORDER — FENTANYL CITRATE (PF) 100 MCG/2ML IJ SOLN
INTRAMUSCULAR | Status: DC | PRN
  Administered 2016-07-12: 25 ug via INTRAVENOUS

## 2016-07-12 MED ORDER — FENTANYL CITRATE (PF) 100 MCG/2ML IJ SOLN
INTRAMUSCULAR | Status: AC
  Filled 2016-07-12: qty 2

## 2016-07-12 MED ORDER — MORPHINE SULFATE (PF) 2 MG/ML IV SOLN: 2.0000 mg | INTRAVENOUS | Status: DC | PRN

## 2016-07-12 MED ORDER — IOPAMIDOL (ISOVUE-370) INJECTION 76%
INTRAVENOUS | Status: AC
  Filled 2016-07-12: qty 100

## 2016-07-12 MED ORDER — LIDOCAINE HCL (PF) 1 % IJ SOLN
INTRAMUSCULAR | Status: DC | PRN
  Administered 2016-07-12: 2 mL

## 2016-07-12 MED ORDER — VERAPAMIL HCL 2.5 MG/ML IV SOLN
INTRAVENOUS | Status: AC
  Filled 2016-07-12: qty 2

## 2016-07-12 MED ORDER — TIROFIBAN HCL IN NACL 5-0.9 MG/100ML-% IV SOLN
INTRAVENOUS | Status: AC
  Filled 2016-07-12: qty 100

## 2016-07-12 MED ORDER — MIDAZOLAM HCL 2 MG/2ML IJ SOLN
INTRAMUSCULAR | Status: AC
  Filled 2016-07-12: qty 2

## 2016-07-12 SURGICAL SUPPLY — 12 items
CATH INFINITI 5FR ANG PIGTAIL (CATHETERS) ×2 IMPLANT
CATH OPTITORQUE TIG 4.0 5F (CATHETERS) ×2 IMPLANT
DEVICE RAD COMP TR BAND LRG (VASCULAR PRODUCTS) ×2 IMPLANT
GLIDESHEATH SLEND SS 6F .021 (SHEATH) ×2 IMPLANT
GUIDEWIRE INQWIRE 1.5J.035X260 (WIRE) ×1 IMPLANT
INQWIRE 1.5J .035X260CM (WIRE) ×2
KIT HEART LEFT (KITS) ×2 IMPLANT
PACK CARDIAC CATHETERIZATION (CUSTOM PROCEDURE TRAY) ×2 IMPLANT
SYR MEDRAD MARK V 150ML (SYRINGE) ×2 IMPLANT
TRANSDUCER W/STOPCOCK (MISCELLANEOUS) ×2 IMPLANT
TUBING CIL FLEX 10 FLL-RA (TUBING) ×2 IMPLANT
WIRE HI TORQ VERSACORE-J 145CM (WIRE) ×2 IMPLANT

## 2016-07-12 NOTE — Progress Notes (Signed)
ANTICOAGULATION CONSULT NOTE   Pharmacy Consult for heparin Indication: chest pain/ACS  Allergies  Allergen Reactions  . Brilinta [Ticagrelor] Anaphylaxis    Also chest tightness    Patient Measurements: Height: 5\' 6"  (167.6 cm) Weight: 120 lb 9.5 oz (54.7 kg) IBW/kg (Calculated) : 59.3 Heparin Dosing Weight: 54 kg wt on 05/03/16  Vital Signs: Temp: 98.3 F (36.8 C) (11/15 0829) Temp Source: Oral (11/15 0829) BP: 153/79 (11/15 1100) Pulse Rate: 49 (11/15 1100)  Labs:  Recent Labs  07/11/16 1807 07/11/16 1818 07/11/16 2322 07/12/16 0442 07/12/16 1126  HGB 13.9  --   --  13.0  --   HCT 42.6  --   --  39.9  --   PLT 290  --   --  263  --   APTT  --  28  --   --   --   LABPROT  --   --   --  14.3  --   INR  --   --   --  1.10  --   HEPARINUNFRC  --  <0.10*  --  0.42 0.37  CREATININE 0.94  --   --  0.84  --   TROPONINI 0.47*  --  2.00* 4.10*  --     Estimated Creatinine Clearance: 60 mL/min (by C-G formula based on SCr of 0.84 mg/dL).   Medical History: Past Medical History:  Diagnosis Date  . Abnormal LFTs    08/2013 Lipitor 40 d/c'd and crestor 10 started   . Anemia    as a young woman  . Anxiety   . Arnold-Chiari malformation, type I (HCC)    MRI brain 02/2010  . CAD (coronary artery disease)    a. NSTEMI 07/2009: LHC - D2 40%, LAD irreg., EF 50% with apical AK (tako-tsubo CM);  b. Inf STEMI (04/2013): Tx Promus DES to CFX;  c. 08/2013 Lexi CL: No ischemia, dist ant/ap/inf-ap infarct, EF   . DEPRESSION   . Dizziness    ? CVA 01/2010 - carotid dopplers with no ICA stenosis  . GERD (gastroesophageal reflux disease)   . HYPERLIPIDEMIA   . HYPERTENSION   . MI (myocardial infarction)   . PAD (peripheral artery disease) (HCC)    s/p L fem pop 11/2013 in setting of 1st toe osteo/gangrene  . Paroxysmal atrial fibrillation (HCC)    a. anticoagulated with Eliquis 10/2014  . RA (rheumatoid arthritis) (HCC)    prior tx by Dr. 11/2014  . Sjogren's syndrome (HCC)  06/02/13   pt denies this (12/01/13)  . SLE (systemic lupus erythematosus) (HCC) dx 05/2013   follows with rheum 06/2013)  . Tachy-brady syndrome (HCC)    a. s/p STJ Leadless pacemaker 11-04-2014 Dr 01-04-2015  . 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  She is on Eliquis 5 mg daily PTA with last dose 11/13 at 1200.  It has been > 24 hours since last dose in a drug that is usually BID dosing interval.  apTT no longer affected by Eliquis. CBC is stable and no signs of bleeding are noted. Patient going to cath today.   Confirmatory heparin level is therapeutic at 0.37.   Goal of Therapy:  Heparin level 0.3-0.7 units/ml Monitor platelets by anticoagulation protocol: Yes   Plan: - Continue heparin at 650 units/hr - Daily heparin level and CBC  -  Monitor for signs and symptoms of bleeding   Bailey Mech, PharmD PGY1 Pharmacy Resident Pager: [phone] 07/12/2016, 12:07 PM

## 2016-07-12 NOTE — Telephone Encounter (Signed)
Done in error.

## 2016-07-12 NOTE — Progress Notes (Signed)
ANTICOAGULATION CONSULT NOTE   Pharmacy Consult for heparin Indication: chest pain/ACS  Allergies  Allergen Reactions  . Brilinta [Ticagrelor] Anaphylaxis    Also chest tightness    Patient Measurements: Height: 5\' 6"  (167.6 cm) Weight: 120 lb 9.5 oz (54.7 kg) IBW/kg (Calculated) : 59.3 Heparin Dosing Weight: 54 kg wt on 05/03/16  Vital Signs: Temp: 98.2 F (36.8 C) (11/15 0330) Temp Source: Oral (11/15 0330) BP: 120/67 (11/15 0400) Pulse Rate: 53 (11/15 0400)  Labs:  Recent Labs  07/11/16 1807 07/11/16 1818 07/11/16 2322 07/12/16 0442  HGB 13.9  --   --  13.0  HCT 42.6  --   --  39.9  PLT 290  --   --  263  APTT  --  28  --   --   LABPROT  --   --   --  14.3  INR  --   --   --  1.10  HEPARINUNFRC  --  <0.10*  --  0.42  CREATININE 0.94  --   --  0.84  TROPONINI 0.47*  --  2.00* 4.10*    Estimated Creatinine Clearance: 60 mL/min (by C-G formula based on SCr of 0.84 mg/dL).   Medical History: Past Medical History:  Diagnosis Date  . Abnormal LFTs    08/2013 Lipitor 40 d/c'd and crestor 10 started   . Anemia    as a young woman  . Anxiety   . Arnold-Chiari malformation, type I (HCC)    MRI brain 02/2010  . CAD (coronary artery disease)    a. NSTEMI 07/2009: LHC - D2 40%, LAD irreg., EF 50% with apical AK (tako-tsubo CM);  b. Inf STEMI (04/2013): Tx Promus DES to CFX;  c. 08/2013 Lexi CL: No ischemia, dist ant/ap/inf-ap infarct, EF   . DEPRESSION   . Dizziness    ? CVA 01/2010 - carotid dopplers with no ICA stenosis  . GERD (gastroesophageal reflux disease)   . HYPERLIPIDEMIA   . HYPERTENSION   . MI (myocardial infarction)   . PAD (peripheral artery disease) (HCC)    s/p L fem pop 11/2013 in setting of 1st toe osteo/gangrene  . Paroxysmal atrial fibrillation (HCC)    a. anticoagulated with Eliquis 10/2014  . RA (rheumatoid arthritis) (HCC)    prior tx by Dr. 11/2014  . Sjogren's syndrome (HCC) 06/02/13   pt denies this (12/01/13)  . SLE (systemic lupus  erythematosus) (HCC) dx 05/2013   follows with rheum 06/2013)  . Tachy-brady syndrome (HCC)    a. s/p STJ Leadless pacemaker 11-04-2014 Dr 01-04-2015  . 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  She is on Eliquis 5 mg daily PTA with last dose 11/13 at 1200.  It has been > 24 hours since last dose in a drug that is usually BID dosing interval.   Wt 54 kg on 05/03/16.  Creat WNL, CBC WNL.  Troponin 0.47.  Given SL NTG x 2 doses in ED.   Initial heparin level is therapeutic at 0.42. CBC stable.   Goal of Therapy:  Heparin level 0.3-0.7 units/ml Monitor platelets by anticoagulation protocol: Yes   Plan: - Continue heparin at 650 units/hr - Confirmation heparin level in 6 hours  07/03/16, PharmD, BCPS 07/12/2016, 5:41 AM Pager: [phone]

## 2016-07-12 NOTE — Progress Notes (Signed)
Mercedes [name] is unwilling to sign her informed consent until she speaks with Dr. Tresa Endo about her Cardiac Cath.  She did speak with Dr. Shirlee Latch last night, but wants to meet the doctor that will do the procedure. Cath lab secretary notified that patient desires to speak to Dr. Tresa Endo to consider herself informed about the procedure.  She specifically has questions about risks of the procedure.

## 2016-07-12 NOTE — Progress Notes (Addendum)
Per [place], cardiology PA, maintain nitro gtt unless pt becomes hypotensive or develops a headache. Will continue to monitor.

## 2016-07-12 NOTE — Progress Notes (Signed)
Pharmacy Anticoagulation: -s/p cath 11/15- clots noted in the L cx and LAD.  Plans for tirofiban for 48hrs or until the next cath (likely on Friday).  - PM platelets count 263>264 ok.  Plan:  Continue -Tirofiban at 0.67mcg/kg/min (borderline for adjustment)  Rashia Mckesson S. Merilynn Finland, PharmD, BCPS Clinical Staff Pharmacist Pager [phone]

## 2016-07-12 NOTE — Interval H&P Note (Signed)
Cath Lab Visit (complete for each Cath Lab visit)  Clinical Evaluation Leading to the Procedure:   ACS: Yes.    Non-ACS:    Anginal Classification: CCS III  Anti-ischemic medical therapy: Minimal Therapy (1 class of medications)  Non-Invasive Test Results: No non-invasive testing performed  Prior CABG: No previous CABG      History and Physical Interval Note:  07/12/2016 12:31 PM  Mercedes [name]  has presented today for surgery, with the diagnosis of NSTEMI  The various methods of treatment have been discussed with the patient and family. After consideration of risks, benefits and other options for treatment, the patient has consented to  Procedure(s): Left Heart Cath and Coronary Angiography (N/A) as a surgical intervention .  The patient's history has been reviewed, patient examined, no change in status, stable for surgery.  I have reviewed the patient's chart and labs.  Questions were answered to the patient's satisfaction.     Mercedes [name]

## 2016-07-12 NOTE — Progress Notes (Signed)
Patient consented to cardiac cath after education from cardiology, placed in ghost chart.   Dentures placed in denture cup w/ water & tablet per pt request and placed at bedside.

## 2016-07-12 NOTE — Progress Notes (Signed)
ANTICOAGULATION CONSULT NOTE   Pharmacy Consult for tirofiban Indication: chest pain/ACS  Allergies  Allergen Reactions  . Brilinta [Ticagrelor] Anaphylaxis    Also chest tightness    Patient Measurements: Height: 5\' 6"  (167.6 cm) Weight: 120 lb 9.5 oz (54.7 kg) IBW/kg (Calculated) : 59.3  Vital Signs: Temp: 98.3 F (36.8 C) (11/15 0829) Temp Source: Oral (11/15 0829) BP: 172/92 (11/15 1400) Pulse Rate: 57 (11/15 1400)  Labs:  Recent Labs  07/11/16 1807 07/11/16 1818 07/11/16 2322 07/12/16 0442 07/12/16 1126  HGB 13.9  --   --  13.0  --   HCT 42.6  --   --  39.9  --   PLT 290  --   --  263  --   APTT  --  28  --   --   --   LABPROT  --   --   --  14.3  --   INR  --   --   --  1.10  --   HEPARINUNFRC  --  <0.10*  --  0.42 0.37  CREATININE 0.94  --   --  0.84  --   TROPONINI 0.47*  --  2.00* 4.10* 2.28*    Estimated Creatinine Clearance: 60 mL/min (by C-G formula based on SCr of 0.84 mg/dL).   Assessment: 60 yo F on Eliquis PTA on heparin per pharmacy for chest pain/NSTEMI.  She is on Eliquis 5 mg daily PTA with last dose 11/13 at 1200.  She is now post cath with clots noted in the L cx and LAD.  Plans for tirofiban for 48hrs or until the next cath (likely on Friday). No further heparin  -SCr= 0.84, CrCl ~ 60  Goal of Therapy:  Heparin level 0.3-0.7 units/ml Monitor platelets by anticoagulation protocol: Yes   Plan: -discontinue heparin  -Tirofiban at 0.21mcg/kg/min (borderline for adjustment) -CBC in 8 hours to check platelet count  12m, Pharm D 07/12/2016 2:26 PM

## 2016-07-12 NOTE — Progress Notes (Signed)
Patient Name: Mercedes [name] Date of Encounter: 07/12/2016  Primary Cardiologist: [facility] Problem List     Principal Problem:   NSTEMI (non-ST elevated myocardial infarction) [facility]) Active Problems:   Dyslipidemia   Tachycardia-bradycardia [facility])   PVD- s/p Lt [facility] April 2015   Rheumatoid arthritis (HCC)   SLE (systemic lupus erythematosus) (HCC)   CAD - S/P Takotsubo MI in 2000, CFX DES Sept 2014   Chest pain     Subjective   Currently free of chest pain. No dyspnea.  Last Eliquis dose was "3 or 4 days ago" When asked why, she answered: "it slipped my mind". No tachyarrhythmia overnight. Sinus bradycardia currently.  Inpatient Medications    Scheduled Meds: . aspirin EC  81 mg Oral Daily  . lisinopril  10 mg Oral Daily  . rosuvastatin  40 mg Oral Daily  . sodium chloride flush  3 mL Intravenous Q12H   Continuous Infusions: . sodium chloride 1 mL/kg/hr (07/12/16 0735)  . heparin 650 Units/hr (07/11/16 2149)  . nitroGLYCERIN 5 mcg/min (07/12/16 0600)   PRN Meds: sodium chloride, acetaminophen, nitroGLYCERIN, ondansetron (ZOFRAN) IV, sodium chloride flush   Vital Signs    Vitals:   07/12/16 0600 07/12/16 0700 07/12/16 0800 07/12/16 0829  BP: 131/74 (!) 105/59 115/64 115/64  Pulse: (!) 52 (!) 51 (!) 50 (!) 51  Resp: 13 17 14 12   Temp:    98.3 F (36.8 C)  TempSrc:    Oral  SpO2: 97% 96% 96% 96%  Weight:      Height:        Intake/Output Summary (Last 24 hours) at 07/12/16 0846 Last data filed at 07/12/16 0735  Gross per 24 hour  Intake           356.84 ml  Output              450 ml  Net           -93.16 ml   Filed Weights   07/11/16 2252  Weight: 120 lb 9.5 oz (54.7 kg)    Physical Exam   Comfortable. GEN: Well nourished, well developed, in no acute distress.  HEENT: Grossly normal.  Neck: Supple, no JVD, carotid bruits, or masses. Cardiac: RRR, no murmurs, rubs, or gallops. No clubbing, cyanosis, edema.  Radials/DP/PT 2+ and equal  bilaterally.  Respiratory:  Respirations regular and unlabored, clear to auscultation bilaterally. GI: Soft, nontender, nondistended, BS + x 4. MS: no deformity or atrophy. Skin: warm and dry, no rash. Neuro:  Strength and sensation are intact. Psych: AAOx3.  Normal affect.  Labs    CBC  Recent Labs  07/11/16 1807 07/12/16 0442  WBC 4.1 5.2  HGB 13.9 13.0  HCT 42.6 39.9  MCV 78.6 78.2  PLT 290 263   Basic Metabolic Panel  Recent Labs  07/11/16 1807 07/12/16 0442  NA 135 136  K 4.2 3.9  CL 104 107  CO2 24 24  GLUCOSE 104* 104*  BUN 7 7  CREATININE 0.94 0.84  CALCIUM 9.2 9.0   Liver Function Tests No results for input(s): AST, ALT, ALKPHOS, BILITOT, PROT, ALBUMIN in the last 72 hours.  Recent Labs  07/11/16 1807  LIPASE 35   Cardiac Enzymes  Recent Labs  07/11/16 1807 07/11/16 2322 07/12/16 0442  TROPONINI 0.47* 2.00* 4.10*   BNP Invalid input(s): POCBNP D-Dimer No results for input(s): DDIMER in the last 72 hours. Hemoglobin A1C No results for input(s): HGBA1C in the last 72  hours. Fasting Lipid Panel  Recent Labs  07/12/16 0442  CHOL 146  HDL 47  LDLCALC 86  TRIG 66  CHOLHDL 3.1   Thyroid Function Tests No results for input(s): TSH, T4TOTAL, T3FREE, THYROIDAB in the last 72 hours.  Invalid input(s): FREET3  Telemetry    Sinus bradycardia - Personally Reviewed  ECG    Sinus bradycardia, prominent anterior broad inverted symmetrical T waves. QTc 450 ms - Personally Reviewed  Radiology    Dg Chest 2 View  Result Date: 07/11/2016 CLINICAL DATA:  Chest pain radiating to LEFT arm. History of myocardial infarction. EXAM: CHEST  2 VIEW COMPARISON:  Chest radiograph October 04, 2015 FINDINGS: Cardiomediastinal silhouette is normal, mildly calcified aortic knob. Increased lung volumes, with mild chronic bronchitic changes. No pleural effusion or focal consolidation. No pneumothorax. Osseous structures are normal. IMPRESSION: Probable  COPD, no acute cardiopulmonary process. Electronically Signed   By: Awilda Metro M.D.   On: 07/11/2016 18:21    Cardiac Studies   Echo pending  Patient Profile     60 yo with known CAD, PAF not compliant with anticoagulation, prior coronary event 2011 suggestive of Takotsubo sd., prior OM occlusion with anatomy suggestive of embolic event, autoimmune disorder presents with chest pain following severe emotional distress with anterior ischemia on ECG and moderate increase in cardiac enzymes.  Assessment & Plan    1. Anterior NSTEMI: circumstances suggest stress cardiomyopathy and the ECG is consistent with that, but she has established CAD and is also at risk for AF-related thromboembolism and vasospasm. Only reliable way to discriminate will be coronary angiography, followed by possible PCI. This procedure has been fully reviewed with the patient and written informed consent has been obtained. Beta blocker on hold due to bradycardia. Continue IV NTG. Echo pending. 2. CAD: LHC (05/19/13): mLAD 50, pD1 40-50, pCFX 75 (culprit), OM with 2 subbranches with abrupt cessation of flow suggestive of embolic event, and pRCA 50-60, mRCA 50-60; faint collaterals from RCA to dCFX; EF 55%, inferior AK. PCI: Promus premier (3.5x16 mm) DES to pCFX. 3. PAFib: noncompliant with anticoagulation. Currently sinus bradycardia. Need to review importance of anticoagulation with her. If PCI is needed, prefer clopidogrel to allow use of DOAC. 4. SSS:  Has leadless ventricular pacemaker, last interrogated in Sep 2017 with normal function. 5. PAD: s/p L fem-pop bypass 6. 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 7. HLP: last LDL 86, but previously 63. Suspect compliance with statin may also be imperfect. Target LDL<70.  Signed, Thurmon Fair, MD  07/12/2016, 8:46 AM

## 2016-07-12 NOTE — H&P (View-Only) (Signed)
Patient Name: Mercedes [name] Date of Encounter: 07/12/2016  Primary Cardiologist: [facility] Problem List     Principal Problem:   NSTEMI (non-ST elevated myocardial infarction) [facility]) Active Problems:   Dyslipidemia   Tachycardia-bradycardia [facility])   PVD- s/p Lt [facility] April 2015   Rheumatoid arthritis (HCC)   SLE (systemic lupus erythematosus) (HCC)   CAD - S/P Takotsubo MI in 2000, CFX DES Sept 2014   Chest pain     Subjective   Currently free of chest pain. No dyspnea.  Last Eliquis dose was "3 or 4 days ago" When asked why, she answered: "it slipped my mind". No tachyarrhythmia overnight. Sinus bradycardia currently.  Inpatient Medications    Scheduled Meds: . aspirin EC  81 mg Oral Daily  . lisinopril  10 mg Oral Daily  . rosuvastatin  40 mg Oral Daily  . sodium chloride flush  3 mL Intravenous Q12H   Continuous Infusions: . sodium chloride 1 mL/kg/hr (07/12/16 0735)  . heparin 650 Units/hr (07/11/16 2149)  . nitroGLYCERIN 5 mcg/min (07/12/16 0600)   PRN Meds: sodium chloride, acetaminophen, nitroGLYCERIN, ondansetron (ZOFRAN) IV, sodium chloride flush   Vital Signs    Vitals:   07/12/16 0600 07/12/16 0700 07/12/16 0800 07/12/16 0829  BP: 131/74 (!) 105/59 115/64 115/64  Pulse: (!) 52 (!) 51 (!) 50 (!) 51  Resp: 13 17 14 12   Temp:    98.3 F (36.8 C)  TempSrc:    Oral  SpO2: 97% 96% 96% 96%  Weight:      Height:        Intake/Output Summary (Last 24 hours) at 07/12/16 0846 Last data filed at 07/12/16 0735  Gross per 24 hour  Intake           356.84 ml  Output              450 ml  Net           -93.16 ml   Filed Weights   07/11/16 2252  Weight: 120 lb 9.5 oz (54.7 kg)    Physical Exam   Comfortable. GEN: Well nourished, well developed, in no acute distress.  HEENT: Grossly normal.  Neck: Supple, no JVD, carotid bruits, or masses. Cardiac: RRR, no murmurs, rubs, or gallops. No clubbing, cyanosis, edema.  Radials/DP/PT 2+ and equal  bilaterally.  Respiratory:  Respirations regular and unlabored, clear to auscultation bilaterally. GI: Soft, nontender, nondistended, BS + x 4. MS: no deformity or atrophy. Skin: warm and dry, no rash. Neuro:  Strength and sensation are intact. Psych: AAOx3.  Normal affect.  Labs    CBC  Recent Labs  07/11/16 1807 07/12/16 0442  WBC 4.1 5.2  HGB 13.9 13.0  HCT 42.6 39.9  MCV 78.6 78.2  PLT 290 263   Basic Metabolic Panel  Recent Labs  07/11/16 1807 07/12/16 0442  NA 135 136  K 4.2 3.9  CL 104 107  CO2 24 24  GLUCOSE 104* 104*  BUN 7 7  CREATININE 0.94 0.84  CALCIUM 9.2 9.0   Liver Function Tests No results for input(s): AST, ALT, ALKPHOS, BILITOT, PROT, ALBUMIN in the last 72 hours.  Recent Labs  07/11/16 1807  LIPASE 35   Cardiac Enzymes  Recent Labs  07/11/16 1807 07/11/16 2322 07/12/16 0442  TROPONINI 0.47* 2.00* 4.10*   BNP Invalid input(s): POCBNP D-Dimer No results for input(s): DDIMER in the last 72 hours. Hemoglobin A1C No results for input(s): HGBA1C in the last 72  hours. Fasting Lipid Panel  Recent Labs  07/12/16 0442  CHOL 146  HDL 47  LDLCALC 86  TRIG 66  CHOLHDL 3.1   Thyroid Function Tests No results for input(s): TSH, T4TOTAL, T3FREE, THYROIDAB in the last 72 hours.  Invalid input(s): FREET3  Telemetry    Sinus bradycardia - Personally Reviewed  ECG    Sinus bradycardia, prominent anterior broad inverted symmetrical T waves. QTc 450 ms - Personally Reviewed  Radiology    Dg Chest 2 View  Result Date: 07/11/2016 CLINICAL DATA:  Chest pain radiating to LEFT arm. History of myocardial infarction. EXAM: CHEST  2 VIEW COMPARISON:  Chest radiograph October 04, 2015 FINDINGS: Cardiomediastinal silhouette is normal, mildly calcified aortic knob. Increased lung volumes, with mild chronic bronchitic changes. No pleural effusion or focal consolidation. No pneumothorax. Osseous structures are normal. IMPRESSION: Probable  COPD, no acute cardiopulmonary process. Electronically Signed   By: Courtnay  Bloomer M.D.   On: 07/11/2016 18:21    Cardiac Studies   Echo pending  Patient Profile     60 yo with known CAD, PAF not compliant with anticoagulation, prior coronary event 2011 suggestive of Takotsubo sd., prior OM occlusion with anatomy suggestive of embolic event, autoimmune disorder presents with chest pain following severe emotional distress with anterior ischemia on ECG and moderate increase in cardiac enzymes.  Assessment & Plan    1. Anterior NSTEMI: circumstances suggest stress cardiomyopathy and the ECG is consistent with that, but she has established CAD and is also at risk for AF-related thromboembolism and vasospasm. Only reliable way to discriminate will be coronary angiography, followed by possible PCI. This procedure has been fully reviewed with the patient and written informed consent has been obtained. Beta blocker on hold due to bradycardia. Continue IV NTG. Echo pending. 2. CAD: LHC (05/19/13): mLAD 50, pD1 40-50, pCFX 75 (culprit), OM with 2 subbranches with abrupt cessation of flow suggestive of embolic event, and pRCA 50-60, mRCA 50-60; faint collaterals from RCA to dCFX; EF 55%, inferior AK. PCI: Promus premier (3.5x16 mm) DES to pCFX. 3. PAFib: noncompliant with anticoagulation. Currently sinus bradycardia. Need to review importance of anticoagulation with her. If PCI is needed, prefer clopidogrel to allow use of DOAC. 4. SSS:  Has leadless ventricular pacemaker, last interrogated in Sep 2017 with normal function. 5. PAD: s/p L fem-pop bypass 6. 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 7. HLP: last LDL 86, but previously 63. Suspect compliance with statin may also be imperfect. Target LDL<70.  Signed, Aava Deland, MD  07/12/2016, 8:46 AM   

## 2016-07-13 ENCOUNTER — Encounter (HOSPITAL_COMMUNITY): Payer: Medicare Other

## 2016-07-13 ENCOUNTER — Encounter (HOSPITAL_COMMUNITY): Payer: Self-pay | Admitting: Cardiovascular Disease

## 2016-07-13 ENCOUNTER — Other Ambulatory Visit (HOSPITAL_COMMUNITY): Payer: Medicare Other

## 2016-07-13 ENCOUNTER — Ambulatory Visit: Payer: Medicare Other | Admitting: Family

## 2016-07-13 LAB — CBC
HCT: 37.5 % (ref 36.0–46.0)
Hemoglobin: 12 g/dL (ref 12.0–15.0)
MCH: 24.8 pg — AB (ref 26.0–34.0)
MCHC: 32 g/dL (ref 30.0–36.0)
MCV: 77.6 fL — AB (ref 78.0–100.0)
PLATELETS: 268 10*3/uL (ref 150–400)
RBC: 4.83 MIL/uL (ref 3.87–5.11)
RDW: 14.9 % (ref 11.5–15.5)
WBC: 4.4 10*3/uL (ref 4.0–10.5)

## 2016-07-13 LAB — BASIC METABOLIC PANEL
Anion gap: 6 (ref 5–15)
BUN: 6 mg/dL (ref 6–20)
CO2: 21 mmol/L — ABNORMAL LOW (ref 22–32)
Calcium: 8.7 mg/dL — ABNORMAL LOW (ref 8.9–10.3)
Chloride: 110 mmol/L (ref 101–111)
Creatinine, Ser: 0.8 mg/dL (ref 0.44–1.00)
GFR calc Af Amer: >60 mL/min (ref >60)
GFR calc non Af Amer: >60 mL/min (ref >60)
Glucose, Bld: 86 mg/dL (ref 65–99)
Potassium: 3.9 mmol/L (ref 3.5–5.1)
Sodium: 137 mmol/L (ref 135–145)

## 2016-07-13 MED FILL — Verapamil HCl IV Soln 2.5 MG/ML: INTRAVENOUS | Qty: 2 | Status: AC

## 2016-07-13 NOTE — Progress Notes (Signed)
 Patient Name: Mercedes [name] Date of Encounter: 07/13/2016  Primary Cardiologist: [facility] Problem List     Principal Problem:   NSTEMI (non-ST elevated myocardial infarction) (HCC) Active Problems:   Dyslipidemia   Tachycardia-bradycardia (HCC)   PVD- s/p Lt FP April 2015   Rheumatoid arthritis (HCC)   SLE (systemic lupus erythematosus) (HCC)   CAD - S/P Takotsubo MI in 2000, CFX DES Sept 2014   Chest pain   Paroxysmal atrial fibrillation (HCC)     Subjective   No angina, has a headache.  Inpatient Medications    Scheduled Meds: . aspirin EC  81 mg Oral Daily  . lisinopril  10 mg Oral Daily  . rosuvastatin  40 mg Oral Daily  . sodium chloride flush  3 mL Intravenous Q12H  . sodium chloride flush  3 mL Intravenous Q12H  . sodium chloride flush  3 mL Intravenous Q12H   Continuous Infusions: . sodium chloride 75 mL/hr at 07/12/16 1929  . tirofiban 0.15 mcg/kg/min (07/13/16 0345)   PRN Meds: sodium chloride, sodium chloride, sodium chloride, acetaminophen, hydrALAZINE, morphine injection, nitroGLYCERIN, ondansetron (ZOFRAN) IV, sodium chloride flush, sodium chloride flush, sodium chloride flush   Vital Signs    Vitals:   07/13/16 0400 07/13/16 0430 07/13/16 0500 07/13/16 0729  BP: 129/76 134/69 (!) 144/78 123/85  Pulse: (!) 57 (!) 56 (!) 55 66  Resp: 18 17 19 13  Temp: 98.7 F (37.1 C)   99.2 F (37.3 C)  TempSrc: Oral   Oral  SpO2: 93% 92% 94% 96%  Weight:      Height:        Intake/Output Summary (Last 24 hours) at 07/13/16 0931 Last data filed at 07/13/16 0544  Gross per 24 hour  Intake          1000.56 ml  Output             1025 ml  Net           -24.44 ml   Filed Weights   07/11/16 2252  Weight: 120 lb 9.5 oz (54.7 kg)    Physical Exam   Appears comfortable GEN: Well nourished, well developed, in no acute distress.  HEENT: Grossly normal.  Neck: Supple, no JVD, carotid bruits, or masses. Cardiac: RRR, no murmurs, rubs, or  gallops. No clubbing, cyanosis, edema.  Radials/DP/PT 2+ and equal bilaterally.  Respiratory:  Respirations regular and unlabored, clear to auscultation bilaterally. GI: Soft, nontender, nondistended, BS + x 4. MS: no deformity or atrophy. Skin: warm and dry, no rash. Neuro:  Strength and sensation are intact. Psych: AAOx3.  Normal affect.  Labs    CBC  Recent Labs  07/12/16 2126 07/13/16 0203  WBC 5.5 4.4  HGB 12.1 12.0  HCT 37.3 37.5  MCV 77.7* 77.6*  PLT 264 268   Basic Metabolic Panel  Recent Labs  07/12/16 0442 07/13/16 0203  NA 136 137  K 3.9 3.9  CL 107 110  CO2 24 21*  GLUCOSE 104* 86  BUN 7 6  CREATININE 0.84 0.80  CALCIUM 9.0 8.7*   Liver Function Tests No results for input(s): AST, ALT, ALKPHOS, BILITOT, PROT, ALBUMIN in the last 72 hours.  Recent Labs  07/11/16 1807  LIPASE 35   Cardiac Enzymes  Recent Labs  07/11/16 2322 07/12/16 0442 07/12/16 1126  TROPONINI 2.00* 4.10* 2.28*     Recent Labs  07/12/16 0442  CHOL 146  HDL 47  LDLCALC 86  TRIG 66    CHOLHDL 3.1    Telemetry    NSR - Personally Reviewed  ECG    NSR, anterior T wave inversion markedly improved since yesterday - Personally Reviewed  Radiology    Dg Chest 2 View  Result Date: 07/11/2016 CLINICAL DATA:  Chest pain radiating to LEFT arm. History of myocardial infarction. EXAM: CHEST  2 VIEW COMPARISON:  Chest radiograph October 04, 2015 FINDINGS: Cardiomediastinal silhouette is normal, mildly calcified aortic knob. Increased lung volumes, with mild chronic bronchitic changes. No pleural effusion or focal consolidation. No pneumothorax. Osseous structures are normal. IMPRESSION: Probable COPD, no acute cardiopulmonary process. Electronically Signed   By: Awilda Metro M.D.   On: 07/11/2016 18:21    Cardiac Studies   11/15 cath   The left ventricular ejection fraction is 50-55% by visual estimate.  There is mild left ventricular systolic  dysfunction.  1st Diag lesion, 30 %stenosed.  Ost Cx to Prox Cx lesion, 0 %stenosed.   Low normal global LV function with an ejection fraction of 50-55% but with focal hypocontractility involving the upper mid to basal inferior wall and distal inferior apical segment.  There is a widely patent stent at the ostium of the left circumflex coronary artery, which seems to extend slightly into the distal left main.  There appears to be 2 areas of focal thrombus; one appears to be at the distal left main outside the stent strut which extends into the left main and at the ostium of the LAD outside the stent strut which appears to slightly jail this segment of LAD.  Mild 30% diagonal stenosis of the LAD.  Normal RCA.  RECOMMENDATION: With the focal thrombus without significant stenosis in the distal left main/ostial circumflex area and ostial LAD, Aggrastat was started at the end of the procedure.  The plan is to continue this for  48 hours and then plan relook catheterization with possible IVUS if necessary.  Patient Profile     60 yo with known CAD, PAF not compliant with anticoagulation, prior coronary event 2011 suggestive of Takotsubo sd, prior OM occlusion with anatomy suggestive of embolic event, autoimmune disorder presents with chest pain following severe emotional distress with anterior ischemia on ECG and moderate increase in cardiac enzymes. Cath shows nonobstructive thrombi in proximal LAD and LCX  Assessment & Plan    1. Anterior NSTEMI: angina free now. DC IV NTG. Echo pending.  2. CAD: For second look cath tomorrow after 48h Aggrastat. Unclear whether primary event may have been cardioembolic or atherothrombotic. Prior PCI: Promus premier (3.5x16 mm) DES to pCFX. 3. PAFib: noncompliant with anticoagulation. Currently sinus bradycardia. Need to review importance of anticoagulation with her. If PCI is needed, prefer clopidogrel to allow use of DOAC. If LMCA bifurcation thrombi are worse,  may have to consider CABG. 4. SSS:  Has leadless ventricular pacemaker, last interrogated in Sep 2017 with normal function. 5. PAD: s/p L fem-pop bypass 6. 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 7. HLP: last LDL 86, but previously 63. On high dose statin. Suspect compliance with statin may also be imperfect. Target LDL<70.  Signed, Thurmon Fair, MD  07/13/2016, 9:31 AM

## 2016-07-14 ENCOUNTER — Encounter (HOSPITAL_COMMUNITY)
Admission: EM | Disposition: A | Discharge status: Skilled Nursing Facility | Payer: Self-pay | Source: Home / Self Care | Attending: Cardiothoracic Surgery

## 2016-07-14 ENCOUNTER — Other Ambulatory Visit: Payer: Self-pay | Admitting: *Deleted

## 2016-07-14 ENCOUNTER — Inpatient Hospital Stay (HOSPITAL_COMMUNITY): Payer: Medicare Other

## 2016-07-14 ENCOUNTER — Encounter (HOSPITAL_COMMUNITY): Payer: Self-pay | Admitting: Internal Medicine

## 2016-07-14 ENCOUNTER — Other Ambulatory Visit (HOSPITAL_COMMUNITY): Payer: Self-pay | Admitting: Radiology

## 2016-07-14 DIAGNOSIS — R079 Chest pain, unspecified: Secondary | ICD-10-CM

## 2016-07-14 DIAGNOSIS — I25110 Atherosclerotic heart disease of native coronary artery with unstable angina pectoris: Secondary | ICD-10-CM

## 2016-07-14 DIAGNOSIS — I4891 Unspecified atrial fibrillation: Secondary | ICD-10-CM

## 2016-07-14 DIAGNOSIS — I2510 Atherosclerotic heart disease of native coronary artery without angina pectoris: Secondary | ICD-10-CM

## 2016-07-14 HISTORY — PX: CARDIAC CATHETERIZATION: SHX172

## 2016-07-14 LAB — ECHOCARDIOGRAM COMPLETE
CHL CUP DOP CALC LVOT VTI: 14.7 cm
CHL CUP MV DEC (S): 211
CHL CUP RV SYS PRESS: 37 mmHg
CHL CUP TV REG PEAK VELOCITY: 292 cm/s
E/e' ratio: 9.99
EWDT: 211 ms
FS: 32 % (ref 28–44)
HEIGHTINCHES: 66 in
IV/PV OW: 1.07
LA diam index: 2.33 cm/m2
LA vol A4C: 45.6 ml
LA vol: 60.9 mL
LASIZE: 37 mm
LAVOLIN: 38.4 mL/m2
LDCA: 2.84 cm2
LEFT ATRIUM END SYS DIAM: 37 mm
LV E/e' medial: 9.99
LV PW d: 13.3 mm — AB (ref 0.6–1.1)
LV TDI E'LATERAL: 7.38
LV TDI E'MEDIAL: 6.64
LV e' LATERAL: 7.38 cm/s
LVEEAVG: 9.99
LVOTD: 19 mm
LVOTPV: 77.6 cm/s
LVOTSV: 42 mL
Lateral S' vel: 13.1 cm/s
MV Peak grad: 2 mmHg
MV pk A vel: 68.4 m/s
MV pk E vel: 73.7 m/s
TAPSE: 16.1 mm
TR max vel: 292 cm/s
WEIGHTICAEL: 1923.2 [oz_av]

## 2016-07-14 LAB — PULMONARY FUNCTION TEST
FEF 25-75 Post: 0.56 L/sec
FEF 25-75 Pre: 1.4 L/sec
FEF2575-%Change-Post: -59 %
FEF2575-%Pred-Post: 26 %
FEF2575-%Pred-Pre: 65 %
FEV1-%Change-Post: -23 %
FEV1-%Pred-Post: 72 %
FEV1-%Pred-Pre: 94 %
FEV1-Post: 1.61 L
FEV1-Pre: 2.1 L
FEV1FVC-%Change-Post: -15 %
FEV1FVC-%Pred-Pre: 90 %
FEV6-%Change-Post: -8 %
FEV6-%Pred-Post: 97 %
FEV6-%Pred-Pre: 105 %
FEV6-Post: 2.65 L
FEV6-Pre: 2.89 L
FEV6FVC-%Change-Post: 0 %
FEV6FVC-%Pred-Post: 101 %
FEV6FVC-%Pred-Pre: 101 %
FVC-%Change-Post: -8 %
FVC-%Pred-Post: 95 %
FVC-%Pred-Pre: 104 %
FVC-Post: 2.69 L
FVC-Pre: 2.95 L
Post FEV1/FVC ratio: 60 %
Post FEV6/FVC ratio: 99 %
Pre FEV1/FVC ratio: 71 %
Pre FEV6/FVC Ratio: 99 %

## 2016-07-14 LAB — POCT ACTIVATED CLOTTING TIME: ACTIVATED CLOTTING TIME: 280 s

## 2016-07-14 SURGERY — INTRAVASCULAR ULTRASOUND/IVUS

## 2016-07-14 MED ORDER — SODIUM CHLORIDE 0.9 % IV SOLN
INTRAVENOUS | Status: AC
  Administered 2016-07-14: 14:00:00 via INTRAVENOUS

## 2016-07-14 MED ORDER — SODIUM CHLORIDE 0.9 % WEIGHT BASED INFUSION: 1.0000 mL/kg/h | INTRAVENOUS | Status: DC

## 2016-07-14 MED ORDER — SODIUM CHLORIDE 0.9% FLUSH: 3.0000 mL | INTRAVENOUS | Status: DC | PRN

## 2016-07-14 MED ORDER — MOMETASONE FURO-FORMOTEROL FUM 200-5 MCG/ACT IN AERO
2.0000 | INHALATION_SPRAY | Freq: Two times a day (BID) | RESPIRATORY_TRACT | Status: DC
  Filled 2016-07-14: qty 8.8

## 2016-07-14 MED ORDER — HEPARIN (PORCINE) IN NACL 2-0.9 UNIT/ML-% IJ SOLN
INTRAMUSCULAR | Status: AC
  Filled 2016-07-14: qty 1000

## 2016-07-14 MED ORDER — NITROGLYCERIN 1 MG/10 ML FOR IR/CATH LAB
INTRA_ARTERIAL | Status: AC
  Filled 2016-07-14: qty 10

## 2016-07-14 MED ORDER — SODIUM CHLORIDE 0.9 % WEIGHT BASED INFUSION
3.0000 mL/kg/h | INTRAVENOUS | Status: DC
  Administered 2016-07-14: 3 mL/kg/h via INTRAVENOUS

## 2016-07-14 MED ORDER — FENTANYL CITRATE (PF) 100 MCG/2ML IJ SOLN
INTRAMUSCULAR | Status: AC
  Filled 2016-07-14: qty 2

## 2016-07-14 MED ORDER — HEPARIN (PORCINE) IN NACL 2-0.9 UNIT/ML-% IJ SOLN
INTRAMUSCULAR | Status: DC | PRN
  Administered 2016-07-14: 08:00:00

## 2016-07-14 MED ORDER — VERAPAMIL HCL 2.5 MG/ML IV SOLN
INTRAVENOUS | Status: DC | PRN
  Administered 2016-07-14: 10 mL via INTRA_ARTERIAL

## 2016-07-14 MED ORDER — SODIUM CHLORIDE 0.9 % IV SOLN: 250.0000 mL | INTRAVENOUS | Status: DC | PRN

## 2016-07-14 MED ORDER — HEPARIN SODIUM (PORCINE) 1000 UNIT/ML IJ SOLN
INTRAMUSCULAR | Status: AC
  Filled 2016-07-14: qty 1

## 2016-07-14 MED ORDER — LIDOCAINE HCL (PF) 1 % IJ SOLN
INTRAMUSCULAR | Status: AC
  Filled 2016-07-14: qty 30

## 2016-07-14 MED ORDER — ENSURE ENLIVE PO LIQD
237.0000 mL | Freq: Three times a day (TID) | ORAL | Status: DC
  Administered 2016-07-14 – 2016-07-16 (×4): 237 mL via ORAL

## 2016-07-14 MED ORDER — HEPARIN SODIUM (PORCINE) 1000 UNIT/ML IJ SOLN
INTRAMUSCULAR | Status: DC | PRN
  Administered 2016-07-14: 3000 [IU] via INTRAVENOUS
  Administered 2016-07-14: 2000 [IU] via INTRAVENOUS

## 2016-07-14 MED ORDER — IOPAMIDOL (ISOVUE-370) INJECTION 76%
INTRAVENOUS | Status: DC | PRN
  Administered 2016-07-14: 90 mL

## 2016-07-14 MED ORDER — VERAPAMIL HCL 2.5 MG/ML IV SOLN
INTRAVENOUS | Status: AC
  Filled 2016-07-14: qty 2

## 2016-07-14 MED ORDER — FENTANYL CITRATE (PF) 100 MCG/2ML IJ SOLN
INTRAMUSCULAR | Status: DC | PRN
  Administered 2016-07-14: 25 ug via INTRAVENOUS

## 2016-07-14 MED ORDER — ALBUTEROL SULFATE (2.5 MG/3ML) 0.083% IN NEBU
2.5000 mg | INHALATION_SOLUTION | Freq: Once | RESPIRATORY_TRACT | Status: AC
  Administered 2016-07-14: 2.5 mg via RESPIRATORY_TRACT

## 2016-07-14 MED ORDER — IOPAMIDOL (ISOVUE-370) INJECTION 76%
INTRAVENOUS | Status: AC
  Filled 2016-07-14: qty 100

## 2016-07-14 MED ORDER — HEPARIN (PORCINE) IN NACL 100-0.45 UNIT/ML-% IJ SOLN
650.0000 [IU]/h | INTRAMUSCULAR | Status: DC
  Administered 2016-07-14 – 2016-07-16 (×2): 650 [IU]/h via INTRAVENOUS
  Filled 2016-07-14 (×2): qty 250

## 2016-07-14 MED ORDER — MIDAZOLAM HCL 2 MG/2ML IJ SOLN
INTRAMUSCULAR | Status: AC
  Filled 2016-07-14: qty 2

## 2016-07-14 MED ORDER — MIDAZOLAM HCL 2 MG/2ML IJ SOLN
INTRAMUSCULAR | Status: DC | PRN
  Administered 2016-07-14: 1 mg via INTRAVENOUS

## 2016-07-14 MED ORDER — SODIUM CHLORIDE 0.9% FLUSH
3.0000 mL | Freq: Two times a day (BID) | INTRAVENOUS | Status: DC
  Administered 2016-07-14 – 2016-07-16 (×3): 3 mL via INTRAVENOUS

## 2016-07-14 MED ORDER — ASPIRIN 81 MG PO CHEW
81.0000 mg | CHEWABLE_TABLET | ORAL | Status: AC
  Administered 2016-07-14: 81 mg via ORAL
  Filled 2016-07-14: qty 1

## 2016-07-14 MED ORDER — SODIUM CHLORIDE 0.9% FLUSH: 3.0000 mL | Freq: Two times a day (BID) | INTRAVENOUS | Status: DC

## 2016-07-14 SURGICAL SUPPLY — 15 items
CATH OPTICROSS 40MHZ (CATHETERS) ×3 IMPLANT
CATH VISTA GUIDE 6FR XB3 (CATHETERS) ×3 IMPLANT
DEVICE RAD COMP TR BAND LRG (VASCULAR PRODUCTS) ×3 IMPLANT
GLIDESHEATH SLEND SS 6F .021 (SHEATH) ×3 IMPLANT
GUIDEWIRE INQWIRE 1.5J.035X260 (WIRE) ×1 IMPLANT
INQWIRE 1.5J .035X260CM (WIRE) ×3
KIT ESSENTIALS PG (KITS) ×3 IMPLANT
KIT HEART LEFT (KITS) ×3 IMPLANT
PACK CARDIAC CATHETERIZATION (CUSTOM PROCEDURE TRAY) ×3 IMPLANT
SLED PULL BACK IVUS (MISCELLANEOUS) ×3 IMPLANT
TRANSDUCER W/STOPCOCK (MISCELLANEOUS) ×3 IMPLANT
TUBING CIL FLEX 10 FLL-RA (TUBING) ×3 IMPLANT
WIRE ASAHI PROWATER 180CM (WIRE) ×3 IMPLANT
WIRE HI TORQ VERSACORE-J 145CM (WIRE) ×3 IMPLANT
WIRE RUNTHROUGH .014X180CM (WIRE) ×3 IMPLANT

## 2016-07-14 NOTE — Progress Notes (Signed)
Patient Name: Mercedes [name] Date of Encounter: 07/14/2016  Primary Cardiologist: [facility] Problem List     Principal Problem:   NSTEMI (non-ST elevated myocardial infarction) [facility]) Active Problems:   Dyslipidemia   Tachycardia-bradycardia [facility])   PVD- s/p Lt [facility] April 2015   Rheumatoid arthritis (HCC)   SLE (systemic lupus erythematosus) (HCC)   CAD - S/P Takotsubo MI in 2000, CFX DES Sept 2014   Chest pain   Paroxysmal atrial fibrillation (HCC)     Subjective   No angina. Just returned from cath, has TR band on, no immediate complications. Reviewed cath findings with patient and her sister, Mercedes [name].  Inpatient Medications    Scheduled Meds: . aspirin EC  81 mg Oral Daily  . lisinopril  10 mg Oral Daily  . rosuvastatin  40 mg Oral Daily  . sodium chloride flush  3 mL Intravenous Q12H  . sodium chloride flush  3 mL Intravenous Q12H   Continuous Infusions: . sodium chloride     PRN Meds: sodium chloride, sodium chloride, hydrALAZINE, morphine injection, nitroGLYCERIN, sodium chloride flush, sodium chloride flush   Vital Signs    Vitals:   07/14/16 0824 07/14/16 0829 07/14/16 0834 07/14/16 0839  BP: 124/78 116/76 123/85 132/85  Pulse: 66 68 68 67  Resp: 10 13 14 10   Temp:      TempSrc:      SpO2: 98% 98% 97% 98%  Weight:      Height:        Intake/Output Summary (Last 24 hours) at 07/14/16 1236 Last data filed at 07/14/16 0700  Gross per 24 hour  Intake             1800 ml  Output             1001 ml  Net              799 ml   Filed Weights   07/11/16 2252 07/14/16 0500  Weight: 120 lb 9.5 oz (54.7 kg) 120 lb 3.2 oz (54.5 kg)    Physical Exam   GEN: Well nourished, well developed, in no acute distress.  HEENT: Grossly normal.  Neck: Supple, no JVD, carotid bruits, or masses. Cardiac: RRR, no murmurs, rubs, or gallops. No clubbing, cyanosis, edema.  Radials/DP/PT 2+ and equal bilaterally.  Respiratory:  Respirations regular and  unlabored, clear to auscultation bilaterally. GI: Soft, nontender, nondistended, BS + x 4. MS: no deformity or atrophy. Skin: warm and dry, no rash. Neuro:  Strength and sensation are intact. Psych: AAOx3.  Normal affect.  Labs    CBC  Recent Labs  07/12/16 2126 07/13/16 0203  WBC 5.5 4.4  HGB 12.1 12.0  HCT 37.3 37.5  MCV 77.7* 77.6*  PLT 264 268   Basic Metabolic Panel  Recent Labs  07/12/16 0442 07/13/16 0203  NA 136 137  K 3.9 3.9  CL 107 110  CO2 24 21*  GLUCOSE 104* 86  BUN 7 6  CREATININE 0.84 0.80  CALCIUM 9.0 8.7*   Liver Function Tests No results for input(s): AST, ALT, ALKPHOS, BILITOT, PROT, ALBUMIN in the last 72 hours.  Recent Labs  07/11/16 1807  LIPASE 35   Cardiac Enzymes  Recent Labs  07/11/16 2322 07/12/16 0442 07/12/16 1126  TROPONINI 2.00* 4.10* 2.28*   BNP Invalid input(s): POCBNP D-Dimer No results for input(s): DDIMER in the last 72 hours. Hemoglobin A1C No results for input(s): HGBA1C in the last 72 hours. Fasting Lipid  Panel  Recent Labs  07/12/16 0442  CHOL 146  HDL 47  LDLCALC 86  TRIG 66  CHOLHDL 3.1   Thyroid Function Tests No results for input(s): TSH, T4TOTAL, T3FREE, THYROIDAB in the last 72 hours.  Invalid input(s): FREET3  Telemetry    NSR, occ V pacing with pseudofusion/fusion - Personally Reviewed  Cardiac Studies  CATH 07/14/16  Unchanged hazy lesions along proximal margin of proximal LCx stent protruding into the LMCA. This results in 40-50% distal LMCA and 70-80% ostial LAD stenoses.  Intravascular ultrasound of LAD and LMCA demonstrates focal stenosis involving distal LMCA and ostial LAD with MLA of 4.5 mm^2. The plaque morphology appears most consistent with fibrosis and calcification rather than thrombus.   Recommendations: 1. Return to 4N for medical management.  Will discontinue tirofiban and initiate heparin infusion, given ACS and history of atrial fibrillation, 4 hours after TR band  deflation. 2. Cardiac surgery consultation to discuss surgical revascularization.  LMCA to LAD PCI could be performed if the patient is felt to be inappropriate for CABG, though given existing LCx stent protruding into LMCA, though would be technically challenging and potentially high risk.  Patient Profile     60 yo with known CAD, PAF not compliant with anticoagulation, prior coronary event 2011 suggestive of Takotsubo sd, prior OM occlusion with anatomy suggestive of embolic event, autoimmune disorder presents with chest pain following severe emotional distress with anterior ischemia on ECG and moderate increase in cardiac enzymes.  Cath showed nonobstructive thrombi in proximal LAD and LCX, repeat cath after 48h Aggrastat showed fibrosis rather than thrombus.  Assessment & Plan    1. Anterior NSTEMI: mild injury by enzymes, angina free >48h.  2. CAD: Unclear whether primary event may have been cardioembolic or atherothrombotic. IVUS today suggests the latter. I wonder whether an older organized afib-related clot might behave/look the same way. Prior PCI: Promus premier (3.5x16 mm) DES to pCFX with stent projecting in LCA, making repeat PCI a difficult proposition, may have to consider CABG. Awaiting surgical opinion. 3.PAFib: noncompliant with anticoagulation. Currently sinus bradycardia, occ V paced. Reviewed importance of anticoagulation with her. 4. SSS: Has leadless ventricular pacemaker, last interrogated in Sep 2017 with normal function. Unable to diagnose recent AF with this single chamber device. 5. PAD:s/p L fem-pop bypass 6. 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 7. BCW:UGQB LDL 86, but previously 63. On high dose statin. Suspect compliance with statin may also be imperfect. Target LDL<70.  Signed, Thurmon Fair, MD  07/14/2016, 12:36 PM

## 2016-07-14 NOTE — Progress Notes (Signed)
Radial band deflated as per protocol. No complications at site. Pt diet advanced to heart healthy and pt tolerating diet w/o N/V.

## 2016-07-14 NOTE — H&P (View-Only) (Signed)
Patient Name: Mercedes [name] Date of Encounter: 07/13/2016  Primary Cardiologist: [facility] Problem List     Principal Problem:   NSTEMI (non-ST elevated myocardial infarction) [facility]) Active Problems:   Dyslipidemia   Tachycardia-bradycardia [facility])   PVD- s/p Lt [facility] April 2015   Rheumatoid arthritis (HCC)   SLE (systemic lupus erythematosus) (HCC)   CAD - S/P Takotsubo MI in 2000, CFX DES Sept 2014   Chest pain   Paroxysmal atrial fibrillation (HCC)     Subjective   No angina, has a headache.  Inpatient Medications    Scheduled Meds: . aspirin EC  81 mg Oral Daily  . lisinopril  10 mg Oral Daily  . rosuvastatin  40 mg Oral Daily  . sodium chloride flush  3 mL Intravenous Q12H  . sodium chloride flush  3 mL Intravenous Q12H  . sodium chloride flush  3 mL Intravenous Q12H   Continuous Infusions: . sodium chloride 75 mL/hr at 07/12/16 1929  . tirofiban 0.15 mcg/kg/min (07/13/16 0345)   PRN Meds: sodium chloride, sodium chloride, sodium chloride, acetaminophen, hydrALAZINE, morphine injection, nitroGLYCERIN, ondansetron (ZOFRAN) IV, sodium chloride flush, sodium chloride flush, sodium chloride flush   Vital Signs    Vitals:   07/13/16 0400 07/13/16 0430 07/13/16 0500 07/13/16 0729  BP: 129/76 134/69 (!) 144/78 123/85  Pulse: (!) 57 (!) 56 (!) 55 66  Resp: 18 17 19 13   Temp: 98.7 F (37.1 C)   99.2 F (37.3 C)  TempSrc: Oral   Oral  SpO2: 93% 92% 94% 96%  Weight:      Height:        Intake/Output Summary (Last 24 hours) at 07/13/16 0931 Last data filed at 07/13/16 0544  Gross per 24 hour  Intake          1000.56 ml  Output             1025 ml  Net           -24.44 ml   Filed Weights   07/11/16 2252  Weight: 120 lb 9.5 oz (54.7 kg)    Physical Exam   Appears comfortable GEN: Well nourished, well developed, in no acute distress.  HEENT: Grossly normal.  Neck: Supple, no JVD, carotid bruits, or masses. Cardiac: RRR, no murmurs, rubs, or  gallops. No clubbing, cyanosis, edema.  Radials/DP/PT 2+ and equal bilaterally.  Respiratory:  Respirations regular and unlabored, clear to auscultation bilaterally. GI: Soft, nontender, nondistended, BS + x 4. MS: no deformity or atrophy. Skin: warm and dry, no rash. Neuro:  Strength and sensation are intact. Psych: AAOx3.  Normal affect.  Labs    CBC  Recent Labs  07/12/16 2126 07/13/16 0203  WBC 5.5 4.4  HGB 12.1 12.0  HCT 37.3 37.5  MCV 77.7* 77.6*  PLT 264 268   Basic Metabolic Panel  Recent Labs  07/12/16 0442 07/13/16 0203  NA 136 137  K 3.9 3.9  CL 107 110  CO2 24 21*  GLUCOSE 104* 86  BUN 7 6  CREATININE 0.84 0.80  CALCIUM 9.0 8.7*   Liver Function Tests No results for input(s): AST, ALT, ALKPHOS, BILITOT, PROT, ALBUMIN in the last 72 hours.  Recent Labs  07/11/16 1807  LIPASE 35   Cardiac Enzymes  Recent Labs  07/11/16 2322 07/12/16 0442 07/12/16 1126  TROPONINI 2.00* 4.10* 2.28*     Recent Labs  07/12/16 0442  CHOL 146  HDL 47  LDLCALC 86  TRIG 66  CHOLHDL 3.1    Telemetry    NSR - Personally Reviewed  ECG    NSR, anterior T wave inversion markedly improved since yesterday - Personally Reviewed  Radiology    Dg Chest 2 View  Result Date: 07/11/2016 CLINICAL DATA:  Chest pain radiating to LEFT arm. History of myocardial infarction. EXAM: CHEST  2 VIEW COMPARISON:  Chest radiograph October 04, 2015 FINDINGS: Cardiomediastinal silhouette is normal, mildly calcified aortic knob. Increased lung volumes, with mild chronic bronchitic changes. No pleural effusion or focal consolidation. No pneumothorax. Osseous structures are normal. IMPRESSION: Probable COPD, no acute cardiopulmonary process. Electronically Signed   By: Awilda Metro M.D.   On: 07/11/2016 18:21    Cardiac Studies   11/15 cath   The left ventricular ejection fraction is 50-55% by visual estimate.  There is mild left ventricular systolic  dysfunction.  1st Diag lesion, 30 %stenosed.  Ost Cx to Prox Cx lesion, 0 %stenosed.   Low normal global LV function with an ejection fraction of 50-55% but with focal hypocontractility involving the upper mid to basal inferior wall and distal inferior apical segment.  There is a widely patent stent at the ostium of the left circumflex coronary artery, which seems to extend slightly into the distal left main.  There appears to be 2 areas of focal thrombus; one appears to be at the distal left main outside the stent strut which extends into the left main and at the ostium of the LAD outside the stent strut which appears to slightly jail this segment of LAD.  Mild 30% diagonal stenosis of the LAD.  Normal RCA.  RECOMMENDATION: With the focal thrombus without significant stenosis in the distal left main/ostial circumflex area and ostial LAD, Aggrastat was started at the end of the procedure.  The plan is to continue this for  48 hours and then plan relook catheterization with possible IVUS if necessary.  Patient Profile     60 yo with known CAD, PAF not compliant with anticoagulation, prior coronary event 2011 suggestive of Takotsubo sd, prior OM occlusion with anatomy suggestive of embolic event, autoimmune disorder presents with chest pain following severe emotional distress with anterior ischemia on ECG and moderate increase in cardiac enzymes. Cath shows nonobstructive thrombi in proximal LAD and LCX  Assessment & Plan    1. Anterior NSTEMI: angina free now. DC IV NTG. Echo pending.  2. CAD: For second look cath tomorrow after 48h Aggrastat. Unclear whether primary event may have been cardioembolic or atherothrombotic. Prior PCI: Promus premier (3.5x16 mm) DES to pCFX. 3. PAFib: noncompliant with anticoagulation. Currently sinus bradycardia. Need to review importance of anticoagulation with her. If PCI is needed, prefer clopidogrel to allow use of DOAC. If LMCA bifurcation thrombi are worse,  may have to consider CABG. 4. SSS:  Has leadless ventricular pacemaker, last interrogated in Sep 2017 with normal function. 5. PAD: s/p L fem-pop bypass 6. 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 7. HLP: last LDL 86, but previously 63. On high dose statin. Suspect compliance with statin may also be imperfect. Target LDL<70.  Signed, Thurmon Fair, MD  07/13/2016, 9:31 AM

## 2016-07-14 NOTE — Consult Note (Signed)
[address]       [place] [address]             [phone]        Mercedes [name] [facility] Medical Record #[id number] Date of Birth: 1956-04-07  Referring:Chris End MD Primary Care: No PCP Per Patient  Chief Complaint:    Chief Complaint  Patient presents with  . Chest Pain  Patient examined, coronary angiograms and echocardiogram and pulmonary function tests personally reviewed and counseled with patient  History of Present Illness:    I was asked to evaluate this 60 year old AA female nondiabetic smoker for surgical coronary revascularization after recent diagnosis of significant distal left main stenosis and ostial 90-95 percent LAD stenosis with symptoms of unstable angina. Cardiac enzymes were mildly elevated consistent with non-ST elevation MI. The patient was admitted to the emergency department with resting chest pain and history of previous PCI to the circumflex approximately 3 years ago. The patient also had left femoropopliteal bypass with saphenous vein 3 years ago. The patient has had coronary angiograms twice during this admission via right radial artery and her cardiologist have concluded that surgical coronary revascularization would be her best long-term therapy. LV function is preserved. LVEDP is normal.  The patient has been on long-term anticoagulation with eliquis And is in atrial flutter currently. No bleeding complications from her and a coagulation. Anticoagulation  with eliquis  ehas been on hold since her admission  The patient's left femoropopliteal bypass is open with ABI of 0.9. Right leg has claudication with ABI of 0.6. She appears to have a good right saphenous vein.   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  The patient has had a pacemaker placed in the past.  Current Activity/ Functional Status: Patient is retired and lives alone. She is supported by family.   Zubrod Score: At  the time of surgery this patient's most appropriate activity status/level should be described as: []     0    Normal activity, no symptoms []     1    Restricted in physical strenuous activity but ambulatory, able to do out light work [x]     2    Ambulatory and capable of self care, unable to do work activities, up and about                 more than 50%  Of the time                            []     3    Only limited self care, in bed greater than 50% of waking hours []     4    Completely disabled, no self care, confined to bed or chair []     5    Moribund  Past Medical History:  Diagnosis Date  . Abnormal LFTs    08/2013 Lipitor 40 d/c'd and crestor 10 started   . Anemia    as a young woman  . Anxiety   . Arnold-Chiari malformation, type I (HCC)    MRI brain 02/2010  . CAD (coronary artery disease)    a. NSTEMI 07/2009: LHC - D2 40%, LAD irreg., EF 50% with apical AK (tako-tsubo CM);  b. Inf STEMI (04/2013): Tx Promus DES to CFX;  c. 08/2013 Lexi CL: No ischemia, dist ant/ap/inf-ap infarct, EF   . DEPRESSION   . Dizziness    ?  CVA 01/2010 - carotid dopplers with no ICA stenosis  . GERD (gastroesophageal reflux disease)   . HYPERLIPIDEMIA   . HYPERTENSION   . MI (myocardial infarction)   . PAD (peripheral artery disease) (HCC)    s/p L fem pop 11/2013 in setting of 1st toe osteo/gangrene  . Paroxysmal atrial fibrillation (HCC)    a. anticoagulated with Eliquis 10/2014  . RA (rheumatoid arthritis) (HCC)    prior tx by Dr. Dareen Piano  . Sjogren's syndrome (HCC) 06/02/13   pt denies this (12/01/13)  . SLE (systemic lupus erythematosus) (HCC) dx 05/2013   follows with rheum Dareen Piano)  . Tachy-brady syndrome (HCC)    a. s/p STJ Leadless pacemaker 11-04-2014 Dr Johney Frame  . 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 ABDOMINAL AORTAGRAM N/A 11/24/2013   Procedure: ABDOMINAL  AORTAGRAM WITH BILATERAL RUNOFF WITH POSSIBLE INTERVENTION;  Surgeon: Chuck Hint, MD;  Location: [facility] CATH LAB;  Service: Cardiovascular;  Laterality: N/A;  . AMPUTATION Left 12/02/2013   Procedure: LEFT 1ST TOE AMPUTATION;  Surgeon: Sherren Kerns, MD;  Location: [facility] OR;  Service: Vascular;  Laterality: Left;  . CARDIAC CATHETERIZATION N/A 07/12/2016   Procedure: Left Heart Cath and Coronary Angiography;  Surgeon: Lennette Bihari, MD;  Location: [facility] INVASIVE CV LAB;  Service: Cardiovascular;  Laterality: N/A;  . CARDIAC CATHETERIZATION N/A 07/14/2016   Procedure: Intravascular Ultrasound/IVUS;  Surgeon: Yvonne Kendall, MD;  Location: [facility] INVASIVE CV LAB;  Service: Cardiovascular;  Laterality: N/A;  . COLONOSCOPY    . CORONARY ANGIOPLASTY  04/2013  . FEMORAL-POPLITEAL BYPASS GRAFT Left 12/02/2013   Procedure:  LEFT FEMORAL-BELOW KNEE POPLITEAL ARTERY BYPASS GRAFT;  Surgeon: Sherren Kerns, MD;  Location: [facility] OR;  Service: Vascular;  Laterality: Left;  . LEFT HEART CATHETERIZATION WITH CORONARY ANGIOGRAM N/A 05/19/2013   Procedure: LEFT HEART CATHETERIZATION WITH CORONARY ANGIOGRAM;  Surgeon: Micheline Chapman, MD;  Location: [facility] CATH LAB;  Service: Cardiovascular;  Laterality: N/A;  . LEFT HEART CATHETERIZATION WITH CORONARY ANGIOGRAM N/A 10/28/2014   Procedure: LEFT HEART CATHETERIZATION WITH CORONARY ANGIOGRAM;  Surgeon: Iran Ouch, MD;  Location: [facility] CATH LAB;  Service: Cardiovascular;  Laterality: N/A;  . PERCUTANEOUS CORONARY STENT INTERVENTION (PCI-S)  05/19/2013   Procedure: PERCUTANEOUS CORONARY STENT INTERVENTION (PCI-S);  Surgeon: Micheline Chapman, MD;  Location: [facility] CATH LAB;  Service: Cardiovascular;;  . PERMANENT PACEMAKER INSERTION N/A 11/04/2014   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  . TUBAL LIGATION      History  Smoking Status  . Former Smoker  . Types: Cigarettes  . Quit date: 05/19/2013  Smokeless Tobacco  . Never Used    Comment:        History  Alcohol Use  . 0.0 oz/week    Social History   Social History  . Marital status: Single    Spouse name: N/A  . Number of children: 3  . Years of education: N/A   Occupational History  . Not on file.   Social History Main Topics  . Smoking status: Former Smoker    Types: Cigarettes    Quit date: 05/19/2013  . Smokeless tobacco: Never Used     Comment:    . Alcohol use 0.0 oz/week  . Drug use: No  . Sexual activity: Not on file   Other Topics Concern  . Not on file  Social History Narrative   Lives alone.    Allergies  Allergen Reactions  . Brilinta [Ticagrelor] Anaphylaxis    Also chest tightness    Current Facility-Administered Medications  Medication Dose Route Frequency Provider Last Rate Last Dose  . 0.9 %  sodium chloride infusion  250 mL Intravenous PRN Lennette Bihari, MD      . 0.9 %  sodium chloride infusion  250 mL Intravenous PRN Yvonne Kendall, MD      . aspirin EC tablet 81 mg  81 mg Oral Daily Laurey Morale, MD   81 mg at 07/13/16 3794  . feeding supplement (ENSURE ENLIVE) (ENSURE ENLIVE) liquid 237 mL  237 mL Oral TID WC Kerin Perna, MD      . heparin ADULT infusion 100 units/mL (25000 units/275mL sodium chloride 0.45%)  650 Units/hr Intravenous Continuous Earnie Larsson, RPH      . hydrALAZINE (APRESOLINE) injection 5 mg  5 mg Intravenous Q4H PRN Lennette Bihari, MD      . lisinopril (PRINIVIL,ZESTRIL) tablet 10 mg  10 mg Oral Daily Laurey Morale, MD   10 mg at 07/13/16 3276  . mometasone-formoterol (DULERA) 200-5 MCG/ACT inhaler 2 puff  2 puff Inhalation BID Kerin Perna, MD      . morphine 2 MG/ML injection 2 mg  2 mg Intravenous Q1H PRN Lennette Bihari, MD      . nitroGLYCERIN (NITROSTAT) SL tablet 0.4 mg  0.4 mg Sublingual Q5 Min x 3 PRN Laurey Morale, MD      . rosuvastatin (CRESTOR) tablet 40 mg  40 mg Oral Daily Laurey Morale, MD   40 mg at 07/13/16 1470  . sodium chloride flush (NS) 0.9 % injection 3 mL  3 mL  Intravenous Q12H Lennette Bihari, MD   3 mL at 07/13/16 2200  . sodium chloride flush (NS) 0.9 % injection 3 mL  3 mL Intravenous PRN Lennette Bihari, MD      . sodium chloride flush (NS) 0.9 % injection 3 mL  3 mL Intravenous Q12H Christopher End, MD   3 mL at 07/14/16 1441  . sodium chloride flush (NS) 0.9 % injection 3 mL  3 mL Intravenous PRN Yvonne Kendall, MD        Prescriptions Prior to Admission  Medication Sig Dispense Refill Last Dose  . apixaban (ELIQUIS) 5 MG TABS tablet Take 1 tablet (5 mg total) by mouth 2 (two) times daily. (Patient taking differently: Take 5 mg by mouth daily. ) 180 tablet 1 07/10/2016 at 1200  . aspirin EC 81 MG tablet Take 1 tablet (81 mg total) by mouth daily.   07/10/2016 at am  . ENSURE (ENSURE) Take 237 mLs by mouth daily.   07/11/2016 at am  . lisinopril (PRINIVIL,ZESTRIL) 10 MG tablet Take 1 tablet (10 mg total) by mouth daily. 90 tablet 3 Past Week at Unknown time  . nitroGLYCERIN (NITROSTAT) 0.4 MG SL tablet Place 1 tablet (0.4 mg total) under the tongue every 5 (five) minutes as needed for chest pain (MAX 3 TABLETS). 25 tablet 3 Taking  . rosuvastatin (CRESTOR) 40 MG tablet Take 1 tablet (40 mg total) by mouth daily. 90 tablet 3 Past Week at Unknown time  . gabapentin (NEURONTIN) 100 MG capsule Take 1-3 capsules at bedtime as needed for phantom pain (Patient not taking: Reported on 07/11/2016) 40 capsule 2 Not Taking at Unknown time  . metoprolol succinate (TOPROL-XL) 25 MG 24 hr tablet  Take 1 tablet (25 mg total) by mouth daily. (Patient not taking: Reported on 07/11/2016) 90 tablet 3 Not Taking at Unknown time    Family History  Problem Relation Age of Onset  . Arthritis Mother   . Emphysema Mother   . Arthritis Father   . COPD Father   . Diabetes Other      Review of Systems:       Cardiac Review of Systems: Y or N  Chest Pain [  Yes yes  ]  Resting SOB [   ] Exertional SOB  [  ]  Orthopnea [  ]   Pedal Edema [   ]    Palpitations [   ] Syncope  [  ]   Presyncope [   ]  General Review of Systems: [Y] = yes [  ]=no Constitional: recent weight change [  ]; anorexia [  ]; fatigue Mahler.Beck  ]; nausea [  ]; night sweats [  ]; fever [  ]; or chills [  ]                                                               Dental: poor dentition[  ]; Last Dentist visit:   Eye : blurred vision [  ]; diplopia [   ]; vision changes [  ];  Amaurosis fugax[  ]; Resp: cough [ yes ];  wheez yes ing[ yes ];  hemoptysis[  ]; shortness of breath[  ]; paroxysmal nocturnal dyspnea[  ]; dyspnea on exertion[  ]; or orthopnea[  ];  GI:  gallstones[  ], vomiting[  ];  dysphagia[  ]; melena[  ];  hematochezia [  ]; heartburn[  ];   Hx of  Colonoscopy[  ]; GU: kidney stones [  ]; hematuria[  ];   dysuria [  ];  nocturia[  ];  history of     obstruction [  ]; urinary frequency [  ]             Skin: rash, swelling[  ];, hair loss[  ];  peripheral edema[  ];  or itching[  ]; Musculosketetal: myalgias[  ];  joint swelling[  ];  joint erythema[  ];  joint pain[ yes rheumatoid arthritis ];  back pain[  ];  Heme/Lymph: bruising[  ];  bleeding[  ];  anemia[  ];  Neuro: TIA[  ];  headaches[ yes ];  stroke[  ];  vertigo[  ];  seizures[  ];   paresthesias[  ];  difficulty walking[  ];  Psych:depression[   yes]; anxiety[ yes ];  Endocrine: diabetes[  ];  thyroid dysfunction[  ];  Immunizations: Flu [  ]; Pneumococcal[  ];  Other:Right-hand dominant   Physical Exam: BP (!) 155/89 (BP Location: Left Arm)   Pulse 75   Temp 98.4 F (36.9 C) (Oral)   Resp 19   Ht 5\' 6"  (1.676 m)   Wt 120 lb 3.2 oz (54.5 kg)   SpO2 96%   BMI 19.40 kg/m        Physical Exam  General: Small middle-aged AA female in no acute distress accompanied by sister HEENT: Normocephalic pupils equal , dentition adequate Neck: Supple without JVD, adenopathy, or bruit Chest: Clear to auscultation, symmetrical breath sounds, no  rhonchi, no tenderness, mild increase in AP diameter Cardiovascular:  Irregular rate and rhythm, no murmur, no gallop, peripheral pulses             palpable in all extremities Abdomen:  Soft, nontender, no palpable mass or organomegaly Extremities: Warm, well-perfused, no clubbing cyanosis edema or tenderness, surgical scars over the left medial thigh from femoropopliteal bypass              no venous stasis changes of the legs Rectal/GU: Deferred Neuro: Grossly non--focal and symmetrical throughout Skin: Clean and dry without rash or ulceration   Diagnostic Studies & Laboratory data:     Recent Radiology Findings:   No results found.   I have independently reviewed the above radiologic studies.  Recent Lab Findings: Lab Results  Component Value Date   WBC 4.4 07/13/2016   HGB 12.0 07/13/2016   HCT 37.5 07/13/2016   PLT 268 07/13/2016   GLUCOSE 86 07/13/2016   CHOL 146 07/12/2016   TRIG 66 07/12/2016   HDL 47 07/12/2016   LDLCALC 86 07/12/2016   ALT 8 05/10/2016   AST 13 05/10/2016   NA 137 07/13/2016   K 3.9 07/13/2016   CL 110 07/13/2016   CREATININE 0.80 07/13/2016   BUN 6 07/13/2016   CO2 21 (L) 07/13/2016   TSH 0.571 10/29/2014   INR 1.10 07/12/2016   HGBA1C 6.4 (H) 12/02/2013      Assessment / Plan:    Unstable angina with non-ST elevation MI Severe left main proximal LAD stenosis Moderate RCA stenosis Preserved LV function COPD with active smoking Hypertension Peripheral vascular disease status post left femoropopliteal bypass 2014    Plan multivessel bypass grafting on Monday, November 20   have discussed the indications and expected benefits of the procedure with the patient. I discussed the risks as well. She agrees to proceed. Carotid duplex is pending.  07/14/2016 4:47 PM

## 2016-07-14 NOTE — Progress Notes (Signed)
2D echocardiogram has been performed. 

## 2016-07-14 NOTE — Progress Notes (Signed)
ANTICOAGULATION CONSULT NOTE   Pharmacy Consult for heparin Indication: chest pain/ACS  Allergies  Allergen Reactions  . Brilinta [Ticagrelor] Anaphylaxis    Also chest tightness    Patient Measurements: Height: 5\' 6"  (167.6 cm) Weight: 120 lb 3.2 oz (54.5 kg) IBW/kg (Calculated) : 59.3  Vital Signs: Temp: 98.6 F (37 C) (11/17 0500) Temp Source: Oral (11/17 0500) BP: 132/85 (11/17 0839) Pulse Rate: 67 (11/17 0839)  Labs:  Recent Labs  07/11/16 1807 07/11/16 1818 07/11/16 2322 07/12/16 0442 07/12/16 1126 07/12/16 2126 07/13/16 0203  HGB 13.9  --   --  13.0  --  12.1 12.0  HCT 42.6  --   --  39.9  --  37.3 37.5  PLT 290  --   --  263  --  264 268  APTT  --  28  --   --   --   --   --   LABPROT  --   --   --  14.3  --   --   --   INR  --   --   --  1.10  --   --   --   HEPARINUNFRC  --  <0.10*  --  0.42 0.37  --   --   CREATININE 0.94  --   --  0.84  --   --  0.80  TROPONINI 0.47*  --  2.00* 4.10* 2.28*  --   --     Estimated Creatinine Clearance: 62.7 mL/min (by C-G formula based on SCr of 0.8 mg/dL).   Assessment: 60 yo F on Eliquis PTA on heparin per pharmacy for chest pain/NSTEMI. She was taken back to cath this am and vascular remains unchanged with hazy lesions. Patient to be evaluated for potential CABG vs high risk PCI. New orders to stop tirofiban and resume heparin after TR band removed.   TR band to be removed at 1p. Goal of Therapy:  Heparin level 0.3-0.7 units/ml Monitor platelets by anticoagulation protocol: Yes   Plan: Restart heparin at 650 units/hr Check 6 hour heparin level Daily CBC/HL while on heparin  68 PharmD., BCPS Clinical Pharmacist Pager [phone] 07/14/2016 12:30 PM

## 2016-07-14 NOTE — Interval H&P Note (Signed)
History and Physical Interval Note:  07/14/2016 7:20 AM  [name]  has presented today for cardiac catheterization, with the diagnosis of NSTEMI. The various methods of treatment have been discussed with the patient and family. After consideration of risks, benefits and other options for treatment, the patient has consented to  Procedure(s): Left Heart Cath and Coronary Angiography (N/A) as a surgical intervention .  The patient's history has been reviewed, patient examined, no change in status, stable for surgery.  I have reviewed the patient's chart and labs.  Questions were answered to the patient's satisfaction.    Cath Lab Visit (complete for each Cath Lab visit)  Clinical Evaluation Leading to the Procedure:   ACS: Yes.    Non-ACS: N/A  Mercedes [name]

## 2016-07-15 ENCOUNTER — Inpatient Hospital Stay (HOSPITAL_COMMUNITY): Payer: Medicare Other

## 2016-07-15 DIAGNOSIS — Z01810 Encounter for preprocedural cardiovascular examination: Secondary | ICD-10-CM

## 2016-07-15 LAB — CBC
HEMATOCRIT: 39.6 % (ref 36.0–46.0)
Hemoglobin: 13.2 g/dL (ref 12.0–15.0)
MCH: 25.6 pg — ABNORMAL LOW (ref 26.0–34.0)
MCHC: 33.3 g/dL (ref 30.0–36.0)
MCV: 76.9 fL — AB (ref 78.0–100.0)
PLATELETS: 275 10*3/uL (ref 150–400)
RBC: 5.15 MIL/uL — AB (ref 3.87–5.11)
RDW: 14.7 % (ref 11.5–15.5)
WBC: 5.9 10*3/uL (ref 4.0–10.5)

## 2016-07-15 LAB — URINALYSIS, ROUTINE W REFLEX MICROSCOPIC
Bilirubin Urine: NEGATIVE
Glucose, UA: NEGATIVE mg/dL
Hgb urine dipstick: NEGATIVE
Ketones, ur: NEGATIVE mg/dL
Leukocytes, UA: NEGATIVE
Nitrite: NEGATIVE
Protein, ur: NEGATIVE mg/dL
Specific Gravity, Urine: 1.015 (ref 1.005–1.030)
pH: 7 (ref 5.0–8.0)

## 2016-07-15 LAB — VAS US DOPPLER PRE CABG
LEFT ECA DIAS: -9 cm/s
LEFT VERTEBRAL DIAS: -17 cm/s
Left CCA dist dias: 21 cm/s
Left CCA dist sys: 74 cm/s
Left CCA prox dias: 16 cm/s
Left CCA prox sys: 86 cm/s
Left ICA dist dias: -21 cm/s
Left ICA dist sys: -95 cm/s
Left ICA prox dias: -46 cm/s
Left ICA prox sys: -132 cm/s
RIGHT ECA DIAS: -13 cm/s
RIGHT VERTEBRAL DIAS: -29 cm/s
Right CCA prox dias: 15 cm/s
Right CCA prox sys: 53 cm/s
Right cca dist sys: -73 cm/s

## 2016-07-15 LAB — HEPARIN LEVEL (UNFRACTIONATED)
HEPARIN UNFRACTIONATED: 0.22 [IU]/mL — AB (ref 0.30–0.70)
HEPARIN UNFRACTIONATED: 0.28 [IU]/mL — AB (ref 0.30–0.70)

## 2016-07-15 LAB — PROTIME-INR
INR: 1.11
Prothrombin Time: 14.4 seconds (ref 11.4–15.2)

## 2016-07-15 LAB — GLUCOSE, CAPILLARY: GLUCOSE-CAPILLARY: 119 mg/dL — AB (ref 65–99)

## 2016-07-15 MED ORDER — ACETAMINOPHEN 325 MG PO TABS
650.0000 mg | ORAL_TABLET | Freq: Four times a day (QID) | ORAL | Status: DC | PRN
  Administered 2016-07-15: 650 mg via ORAL
  Filled 2016-07-15 (×2): qty 2

## 2016-07-15 NOTE — Progress Notes (Addendum)
Pre-op Cardiac Surgery  Carotid Findings:  Right: 1-39% ICA plaquing.  Left: 40-59% left ICA stenosis, lowest end of scale.  Bilateral vertebral artery flow is antegrade.   Upper Extremity Right Left  Brachial Pressures  116T 112T  Radial Waveforms T T  Ulnar Waveforms T T  Palmar Arch (Allen's Test)  Doppler signal obliterates with both radial and ulnar compression. Doppler signal remains normal with radial compression and obliterates with ulnar compression   Findings:      Lower  Extremity Right Left  Dorsalis Pedis      Anterior Tibial 52 M 119M  Posterior Tibial 76 M 120M  Ankle/Brachial Indices 0.66 >1.0    Findings:  Right ABI indicates a moderate reduction in arterial flow to the right lower extremity. Left ABI indicates adequate arterial flow to the left lower extremity.

## 2016-07-15 NOTE — Progress Notes (Signed)
ANTICOAGULATION CONSULT NOTE - Follow Up Consult  Pharmacy Consult for heparin Indication: CAD awaiting CABG  Labs:  Recent Labs  07/12/16 0442 07/12/16 1126 07/12/16 2126 07/13/16 0203 07/15/16 0205  HGB 13.0  --  12.1 12.0 13.2  HCT 39.9  --  37.3 37.5 39.6  PLT 263  --  264 268 275  LABPROT 14.3  --   --   --   --   INR 1.10  --   --   --   --   HEPARINUNFRC 0.42 0.37  --   --  0.22*  CREATININE 0.84  --   --  0.80  --   TROPONINI 4.10* 2.28*  --   --   --     Assessment/Plan:  60yo female subtherapeutic on heparin after resuming post-cath but was previously therapeutic at this rate and likely needs more time to accumulate. Will continue gtt at current rate and check with additional level.   Vernard Gambles, PharmD, BCPS  07/15/2016,2:40 AM

## 2016-07-15 NOTE — Progress Notes (Signed)
Patient Name: Mercedes [name]      SUBJECTIVE: 60 year old woman admitted with chest pain. Catheterization demonstrated severe left main disease seen in consultation by Dr. PVT. CABG scheduled Monday has a history of prior coronary disease and stenting as well as a history of "proximal". She has known peripheral vascular disease. She carries a diagnosis of atrial fibrillation. Telemetry is notable for junctional rhythm.  denies chest pain  LVEF preserved  Past Medical History:  Diagnosis Date  . Abnormal LFTs    08/2013 Lipitor 40 d/c'd and crestor 10 started   . Anemia    as a young woman  . Anxiety   . Arnold-Chiari malformation, type I (HCC)    MRI brain 02/2010  . CAD (coronary artery disease)    a. NSTEMI 07/2009: LHC - D2 40%, LAD irreg., EF 50% with apical AK (tako-tsubo CM);  b. Inf STEMI (04/2013): Tx Promus DES to CFX;  c. 08/2013 Lexi CL: No ischemia, dist ant/ap/inf-ap infarct, EF   . DEPRESSION   . Dizziness    ? CVA 01/2010 - carotid dopplers with no ICA stenosis  . GERD (gastroesophageal reflux disease)   . HYPERLIPIDEMIA   . HYPERTENSION   . MI (myocardial infarction)   . PAD (peripheral artery disease) (HCC)    s/p L fem pop 11/2013 in setting of 1st toe osteo/gangrene  . Paroxysmal atrial fibrillation (HCC)    a. anticoagulated with Eliquis 10/2014  . RA (rheumatoid arthritis) (HCC)    prior tx by Dr. Dareen Piano  . Sjogren's syndrome (HCC) 06/02/13   pt denies this (12/01/13)  . SLE (systemic lupus erythematosus) (HCC) dx 05/2013   follows with rheum Dareen Piano)  . Tachy-brady syndrome (HCC)    a. s/p STJ Leadless pacemaker 11-04-2014 Dr Johney Frame  . 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 aspirin EC  81 mg Oral Daily  . feeding supplement (ENSURE ENLIVE)  237 mL Oral TID WC  . lisinopril  10 mg Oral Daily  . mometasone-formoterol  2 puff Inhalation  BID  . rosuvastatin  40 mg Oral Daily  . sodium chloride flush  3 mL Intravenous Q12H  . sodium chloride flush  3 mL Intravenous Q12H   Continuous Infusions: . heparin 650 Units/hr (07/15/16 0700)   sodium chloride, sodium chloride, hydrALAZINE, morphine injection, nitroGLYCERIN, sodium chloride flush, sodium chloride flush    PHYSICAL EXAM Vitals:   07/15/16 0154 07/15/16 0400 07/15/16 0729 07/15/16 1120  BP: 110/74  101/77 114/75  Pulse: 62 69 66 70  Resp: 13 12 15 20   Temp:  98.6 F (37 C) 98.2 F (36.8 C) 98 F (36.7 C)  TempSrc:  Oral Oral Oral  SpO2: 97% 98% 99% 99%  Weight:      Height:        Well developed and nourished in no acute distress HENT normal Neck supple with JVP-flat Clear Regular rate and rhythm, no murmurs or gallops Abd-soft with active BS No Clubbing cyanosis edema Skin-warm and dry A & Oriented  Grossly normal sensory and motor function  Telemetry Personally reviewed  notable for junctional rhythm with retrograde VA conduction. Heart rate mean in the 70s.:    Intake/Output Summary (Last 24 hours) at 07/15/16 1155 Last data filed at 07/15/16 0700  Gross per 24 hour  Intake  536.58 ml  Output              850 ml  Net          -313.42 ml    LABS: Basic Metabolic Panel:  Recent Labs Lab 07/11/16 1807 07/12/16 0442 07/13/16 0203  NA 135 136 137  K 4.2 3.9 3.9  CL 104 107 110  CO2 24 24 21*  GLUCOSE 104* 104* 86  BUN 7 7 6   CREATININE 0.94 0.84 0.80  CALCIUM 9.2 9.0 8.7*   Cardiac Enzymes: No results for input(s): CKTOTAL, CKMB, CKMBINDEX, TROPONINI in the last 72 hours. CBC:  Recent Labs Lab 07/11/16 1807 07/12/16 0442 07/12/16 2126 07/13/16 0203 07/15/16 0205  WBC 4.1 5.2 5.5 4.4 5.9  HGB 13.9 13.0 12.1 12.0 13.2  HCT 42.6 39.9 37.3 37.5 39.6  MCV 78.6 78.2 77.7* 77.6* 76.9*  PLT 290 263 264 268 275   PROTIME:  Recent Labs  07/15/16 0205  LABPROT 14.4  INR 1.11   Troponin 4.10   ASSESSMENT AND  PLAN:  Principal Problem:   NSTEMI (non-ST elevated myocardial infarction) (HCC) Active Problems:   Dyslipidemia   Tachycardia-bradycardia (HCC)   PVD- s/p Lt [facility] April 2015   Rheumatoid arthritis (HCC)   SLE (systemic lupus erythematosus) (HCC)   CAD - S/P Takotsubo MI in 2000, CFX DES Sept 2014   Chest pain   Paroxysmal atrial fibrillation (HCC)  We'll sit up and ambulate a little bit. Continue IV heparin. No bleeding issues Suspect it will be issues related to sinus node dysfunction postoperatively. Atrial pacing of benefit probably with blood pressure support.   Signed, [birth year]-1999 MD  07/15/2016

## 2016-07-15 NOTE — Progress Notes (Signed)
CARDIAC REHAB PHASE I   10:07am-10:51am Pre-op teaching completed  [name], MS 07/15/2016 10:51 AM

## 2016-07-15 NOTE — Progress Notes (Signed)
ANTICOAGULATION CONSULT NOTE   Pharmacy Consult for heparin Indication: chest pain/ACS  Allergies  Allergen Reactions  . Brilinta [Ticagrelor] Anaphylaxis    Also chest tightness    Patient Measurements: Height: 5\' 6"  (167.6 cm) Weight: 120 lb 3.2 oz (54.5 kg) IBW/kg (Calculated) : 59.3  Vital Signs: Temp: 98 F (36.7 C) (11/18 1120) Temp Source: Oral (11/18 1120) BP: 114/75 (11/18 1120) Pulse Rate: 70 (11/18 1120)  Labs:  Recent Labs  07/12/16 2126 07/13/16 0203 07/15/16 0205 07/15/16 0733  HGB 12.1 12.0 13.2  --   HCT 37.3 37.5 39.6  --   PLT 264 268 275  --   LABPROT  --   --  14.4  --   INR  --   --  1.11  --   HEPARINUNFRC  --   --  0.22* 0.28*  CREATININE  --  0.80  --   --     Estimated Creatinine Clearance: 62.7 mL/min (by C-G formula based on SCr of 0.8 mg/dL).   Assessment: 60 yo F on Eliquis PTA - last dose 11/13  on heparin per pharmacy for chest pain/NSTEMI. She was taken back to cath 11/17 and vascular remains unchanged with hazy lesions in LM. Patient planned for  CABG Monday 11/20. Heparin restarted after cath 650 uts/hr HL 0.28 just below goal but rising and no ongoing CP this am.  CBC stable.   Goal of Therapy:  Heparin level 0.3-0.7 units/ml Monitor platelets by anticoagulation protocol: Yes   Plan: Continue heparin at 650 units/hr Daily CBC/HL while on heparin   12/20 Pharm.D. CPP, BCPS Clinical Pharmacist [phone] 07/15/2016 1:57 PM

## 2016-07-16 DIAGNOSIS — I498 Other specified cardiac arrhythmias: Secondary | ICD-10-CM

## 2016-07-16 LAB — CBC
HEMATOCRIT: 41.6 % (ref 36.0–46.0)
HEMOGLOBIN: 13.6 g/dL (ref 12.0–15.0)
MCH: 25.2 pg — AB (ref 26.0–34.0)
MCHC: 32.7 g/dL (ref 30.0–36.0)
MCV: 77.2 fL — AB (ref 78.0–100.0)
Platelets: 304 10*3/uL (ref 150–400)
RBC: 5.39 MIL/uL — ABNORMAL HIGH (ref 3.87–5.11)
RDW: 14.8 % (ref 11.5–15.5)
WBC: 4.8 10*3/uL (ref 4.0–10.5)

## 2016-07-16 LAB — HEPARIN LEVEL (UNFRACTIONATED): Heparin Unfractionated: 0.39 IU/mL (ref 0.30–0.70)

## 2016-07-16 LAB — POCT I-STAT 3, ART BLOOD GAS (G3+)
Acid-base deficit: 1 mmol/L (ref 0.0–2.0)
Bicarbonate: 20.7 mmol/L (ref 20.0–28.0)
O2 Saturation: 96 %
PCO2 ART: 28.3 mmHg — AB (ref 32.0–48.0)
PH ART: 7.473 — AB (ref 7.350–7.450)
TCO2: 22 mmol/L (ref 0–100)
pO2, Arterial: 73 mmHg — ABNORMAL LOW (ref 83.0–108.0)

## 2016-07-16 LAB — PREPARE RBC (CROSSMATCH)

## 2016-07-16 LAB — SURGICAL PCR SCREEN
MRSA, PCR: NEGATIVE
Staphylococcus aureus: NEGATIVE

## 2016-07-16 MED ORDER — PLASMA-LYTE 148 IV SOLN
INTRAVENOUS | Status: AC
  Administered 2016-07-17: 09:00:00
  Filled 2016-07-16: qty 2.5

## 2016-07-16 MED ORDER — PHENYLEPHRINE HCL 10 MG/ML IJ SOLN
30.0000 ug/min | INTRAVENOUS | Status: AC
  Administered 2016-07-17: 25 ug/min via INTRAVENOUS
  Filled 2016-07-16: qty 2

## 2016-07-16 MED ORDER — INSULIN REGULAR HUMAN 100 UNIT/ML IJ SOLN
INTRAMUSCULAR | Status: DC
  Filled 2016-07-16: qty 2.5

## 2016-07-16 MED ORDER — CEFUROXIME SODIUM 1.5 G IJ SOLR
1.5000 g | INTRAMUSCULAR | Status: AC
  Administered 2016-07-17: 1.5 g via INTRAVENOUS
  Administered 2016-07-17: .75 g via INTRAVENOUS
  Filled 2016-07-16: qty 1.5

## 2016-07-16 MED ORDER — LISINOPRIL 2.5 MG PO TABS: 2.5000 mg | ORAL_TABLET | Freq: Every day | ORAL | Status: DC

## 2016-07-16 MED ORDER — NITROGLYCERIN IN D5W 200-5 MCG/ML-% IV SOLN
2.0000 ug/min | INTRAVENOUS | Status: AC
  Administered 2016-07-17: 10 ug/min via INTRAVENOUS
  Filled 2016-07-16: qty 250

## 2016-07-16 MED ORDER — SODIUM CHLORIDE 0.9% FLUSH
10.0000 mL | INTRAVENOUS | Status: DC | PRN
  Administered 2016-07-17: 10 mL
  Filled 2016-07-16: qty 40

## 2016-07-16 MED ORDER — METOPROLOL TARTRATE 12.5 MG HALF TABLET
12.5000 mg | ORAL_TABLET | Freq: Once | ORAL | Status: DC
  Filled 2016-07-16: qty 1

## 2016-07-16 MED ORDER — DIAZEPAM 2 MG PO TABS
2.0000 mg | ORAL_TABLET | Freq: Once | ORAL | Status: DC
  Filled 2016-07-16: qty 1

## 2016-07-16 MED ORDER — MAGNESIUM SULFATE 50 % IJ SOLN
40.0000 meq | INTRAMUSCULAR | Status: DC
  Filled 2016-07-16: qty 10

## 2016-07-16 MED ORDER — BISACODYL 5 MG PO TBEC
5.0000 mg | DELAYED_RELEASE_TABLET | Freq: Once | ORAL | Status: AC
  Administered 2016-07-16: 5 mg via ORAL
  Filled 2016-07-16: qty 1

## 2016-07-16 MED ORDER — CHLORHEXIDINE GLUCONATE 4 % EX LIQD
60.0000 mL | Freq: Once | CUTANEOUS | Status: AC
  Administered 2016-07-17: 4 via TOPICAL
  Filled 2016-07-16: qty 60

## 2016-07-16 MED ORDER — ALPRAZOLAM 0.25 MG PO TABS
0.2500 mg | ORAL_TABLET | ORAL | Status: DC | PRN
Start: 2016-07-16 — End: 2016-07-17
  Administered 2016-07-16: 0.5 mg via ORAL
  Filled 2016-07-16: qty 2

## 2016-07-16 MED ORDER — VANCOMYCIN HCL IN DEXTROSE 1-5 GM/200ML-% IV SOLN
1000.0000 mg | INTRAVENOUS | Status: AC
  Administered 2016-07-17: 1000 mg via INTRAVENOUS
  Filled 2016-07-16: qty 200

## 2016-07-16 MED ORDER — TRANEXAMIC ACID (OHS) BOLUS VIA INFUSION
15.0000 mg/kg | INTRAVENOUS | Status: AC
  Administered 2016-07-17: 817.5 mg via INTRAVENOUS
  Filled 2016-07-16: qty 818

## 2016-07-16 MED ORDER — TRAZODONE HCL 50 MG PO TABS
50.0000 mg | ORAL_TABLET | Freq: Every evening | ORAL | Status: DC | PRN
  Filled 2016-07-16: qty 1

## 2016-07-16 MED ORDER — CHLORHEXIDINE GLUCONATE 0.12 % MT SOLN
15.0000 mL | Freq: Once | OROMUCOSAL | Status: AC
  Administered 2016-07-17: 15 mL via OROMUCOSAL
  Filled 2016-07-16: qty 15

## 2016-07-16 MED ORDER — DEXMEDETOMIDINE HCL IN NACL 400 MCG/100ML IV SOLN
0.1000 ug/kg/h | INTRAVENOUS | Status: DC
  Filled 2016-07-16: qty 100

## 2016-07-16 MED ORDER — DEXTROSE 5 % IV SOLN
750.0000 mg | INTRAVENOUS | Status: DC
  Filled 2016-07-16: qty 750

## 2016-07-16 MED ORDER — CHLORHEXIDINE GLUCONATE 4 % EX LIQD
60.0000 mL | Freq: Once | CUTANEOUS | Status: AC
  Administered 2016-07-16: 4 via TOPICAL
  Filled 2016-07-16: qty 60

## 2016-07-16 MED ORDER — TEMAZEPAM 15 MG PO CAPS: 15.0000 mg | ORAL_CAPSULE | Freq: Once | ORAL | Status: DC | PRN

## 2016-07-16 MED ORDER — EPINEPHRINE PF 1 MG/ML IJ SOLN
0.0000 ug/min | INTRAVENOUS | Status: DC
  Filled 2016-07-16: qty 4

## 2016-07-16 MED ORDER — POTASSIUM CHLORIDE 2 MEQ/ML IV SOLN
80.0000 meq | INTRAVENOUS | Status: DC
  Filled 2016-07-16: qty 40

## 2016-07-16 MED ORDER — DOPAMINE-DEXTROSE 3.2-5 MG/ML-% IV SOLN
0.0000 ug/kg/min | INTRAVENOUS | Status: DC
  Filled 2016-07-16: qty 250

## 2016-07-16 MED ORDER — SODIUM CHLORIDE 0.9 % IV SOLN
INTRAVENOUS | Status: DC
  Filled 2016-07-16: qty 30

## 2016-07-16 MED ORDER — TRANEXAMIC ACID 1000 MG/10ML IV SOLN
1.5000 mg/kg/h | INTRAVENOUS | Status: AC
  Administered 2016-07-17: 1.5 mg/kg/h via INTRAVENOUS
  Filled 2016-07-16: qty 25

## 2016-07-16 MED ORDER — NICOTINE 14 MG/24HR TD PT24
14.0000 mg | MEDICATED_PATCH | Freq: Every day | TRANSDERMAL | Status: DC
  Filled 2016-07-16 (×2): qty 1

## 2016-07-16 MED ORDER — TRANEXAMIC ACID (OHS) PUMP PRIME SOLUTION
2.0000 mg/kg | INTRAVENOUS | Status: DC
  Filled 2016-07-16: qty 1.09

## 2016-07-16 MED ORDER — NICOTINE POLACRILEX 2 MG MT GUM
2.0000 mg | CHEWING_GUM | OROMUCOSAL | Status: DC | PRN
  Filled 2016-07-16: qty 1

## 2016-07-16 NOTE — Progress Notes (Signed)
ANTICOAGULATION CONSULT NOTE   Pharmacy Consult for heparin Indication: chest pain/ACS  Allergies  Allergen Reactions  . Brilinta [Ticagrelor] Anaphylaxis    Also chest tightness    Patient Measurements: Height: 5\' 6"  (167.6 cm) Weight: 120 lb 3.2 oz (54.5 kg) IBW/kg (Calculated) : 59.3  Vital Signs: Temp: 98.1 F (36.7 C) (11/19 1122) Temp Source: Oral (11/19 1122) BP: 109/81 (11/19 1200) Pulse Rate: 62 (11/19 1200)  Labs:  Recent Labs  07/15/16 0205 07/15/16 0733 07/16/16 0230  HGB 13.2  --  13.6  HCT 39.6  --  41.6  PLT 275  --  304  LABPROT 14.4  --   --   INR 1.11  --   --   HEPARINUNFRC 0.22* 0.28* 0.39    Estimated Creatinine Clearance: 62.7 mL/min (by C-G formula based on SCr of 0.8 mg/dL).   Assessment: 60 yo F on Eliquis PTA - last dose 11/13  on heparin per pharmacy for chest pain/NSTEMI. She was taken back to cath 11/17 and vascular remains unchanged with hazy lesions in LM. Patient planned for  CABG Monday 11/20. Heparin restarted after cath 650 uts/hr HL 0.39 at goal, and no ongoing CP this am.  CBC stable.  Will turn heparin off on call to OR in am.    Goal of Therapy:  Heparin level 0.3-0.7 units/ml Monitor platelets by anticoagulation protocol: Yes   Plan: Continue heparin at 650 units/hr - off 11/20 0400 on call to OR Daily CBC/HL while on heparin   12/20 Pharm.D. CPP, BCPS Clinical Pharmacist [phone] 07/16/2016 2:48 PM

## 2016-07-16 NOTE — Progress Notes (Addendum)
Patient Name: Mercedes [name]      SUBJECTIVE: 60 year old woman admitted with chest pain. Catheterization demonstrated severe left main disease seen in consultation by Dr. PVT. CABG scheduled Monday has a history of prior coronary disease and stenting as well as a history of "proximal". She has known peripheral vascular disease. She carries a diagnosis of atrial fibrillation. Telemetry is notable for intermittent junctional rhythm.  denies chest pain  Ambulating some. Concerned about her low blood pressure  LVEF preserved  Past Medical History:  Diagnosis Date  . Abnormal LFTs    08/2013 Lipitor 40 d/c'd and crestor 10 started   . Anemia    as a young woman  . Anxiety   . Arnold-Chiari malformation, type I (HCC)    MRI brain 02/2010  . CAD (coronary artery disease)    a. NSTEMI 07/2009: LHC - D2 40%, LAD irreg., EF 50% with apical AK (tako-tsubo CM);  b. Inf STEMI (04/2013): Tx Promus DES to CFX;  c. 08/2013 Lexi CL: No ischemia, dist ant/ap/inf-ap infarct, EF   . DEPRESSION   . Dizziness    ? CVA 01/2010 - carotid dopplers with no ICA stenosis  . GERD (gastroesophageal reflux disease)   . HYPERLIPIDEMIA   . HYPERTENSION   . MI (myocardial infarction)   . PAD (peripheral artery disease) (HCC)    s/p L fem pop 11/2013 in setting of 1st toe osteo/gangrene  . Paroxysmal atrial fibrillation (HCC)    a. anticoagulated with Eliquis 10/2014  . RA (rheumatoid arthritis) (HCC)    prior tx by Dr. Dareen Piano  . Sjogren's syndrome (HCC) 06/02/13   pt denies this (12/01/13)  . SLE (systemic lupus erythematosus) (HCC) dx 05/2013   follows with rheum Dareen Piano)  . Tachy-brady syndrome (HCC)    a. s/p STJ Leadless pacemaker 11-04-2014 Dr Johney Frame  . 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 aspirin EC  81 mg Oral Daily  . feeding supplement (ENSURE ENLIVE)  237 mL Oral TID WC  .  lisinopril  10 mg Oral Daily  . mometasone-formoterol  2 puff Inhalation BID  . nicotine  14 mg Transdermal Daily  . rosuvastatin  40 mg Oral Daily  . sodium chloride flush  3 mL Intravenous Q12H   Continuous Infusions: . heparin 650 Units/hr (07/16/16 0551)   sodium chloride, acetaminophen, hydrALAZINE, morphine injection, nicotine polacrilex, nitroGLYCERIN, sodium chloride flush, traZODone    PHYSICAL EXAM Vitals:   07/16/16 0400 07/16/16 0826 07/16/16 0915 07/16/16 0917  BP: (!) 87/50 (!) 86/67 94/77 94/77   Pulse: (!) 58 (!) 59 63   Resp: 17 16 16    Temp: 97.5 F (36.4 C) 97.9 F (36.6 C)    TempSrc: Oral Oral    SpO2: 94% 99% 100%   Weight:      Height:        Well developed and nourished in no acute distress HENT normal Neck supple with JVP-flat Clear Regular rate and rhythm, no murmurs or gallops Abd-soft with active BS No Clubbing cyanosis edema Skin-warm and dry A & Oriented  Grossly normal sensory and motor function  Telemetry Personally reviewed  notable for junctional rhythm with retrograde VA conduction. Heart rate mean in the 70s.:    Intake/Output Summary (Last 24 hours) at 07/16/16 1116 Last data filed at 07/15/16 1500  Gross per 24 hour  Intake  52 ml  Output                0 ml  Net               52 ml    LABS: Basic Metabolic Panel:  Recent Labs Lab 07/11/16 1807 07/12/16 0442 07/13/16 0203  NA 135 136 137  K 4.2 3.9 3.9  CL 104 107 110  CO2 24 24 21*  GLUCOSE 104* 104* 86  BUN 7 7 6   CREATININE 0.94 0.84 0.80  CALCIUM 9.2 9.0 8.7*   Cardiac Enzymes: No results for input(s): CKTOTAL, CKMB, CKMBINDEX, TROPONINI in the last 72 hours. CBC:  Recent Labs Lab 07/11/16 1807 07/12/16 0442 07/12/16 2126 07/13/16 0203 07/15/16 0205 07/16/16 0230  WBC 4.1 5.2 5.5 4.4 5.9 4.8  HGB 13.9 13.0 12.1 12.0 13.2 13.6  HCT 42.6 39.9 37.3 37.5 39.6 41.6  MCV 78.6 78.2 77.7* 77.6* 76.9* 77.2*  PLT 290 263 264 268 275 304    PROTIME:  Recent Labs  07/15/16 0205  LABPROT 14.4  INR 1.11   Troponin 4.10   ASSESSMENT AND PLAN:  Principal Problem:   NSTEMI (non-ST elevated myocardial infarction) (HCC) Active Problems:   Dyslipidemia   Tachycardia-bradycardia (HCC)   PVD- s/p Lt [facility] April 2015   Rheumatoid arthritis (HCC)   SLE (systemic lupus erythematosus) (HCC)   CAD - S/P Takotsubo MI in 2000, CFX DES Sept 2014   Chest pain   Paroxysmal atrial fibrillation (HCC) Sinus node dysfunction and junctional rhythm   Hypotension remains recurring issue. To some degree of may be related to the episodes of junctional rhythm. There is no jugular venous distention other changes in heart rate to suggest hemodynamic compromise  In the interim we will decrease her lisinopril  We'll sit up and ambulate a little bit. Continue IV heparin. No bleeding issues    Signed, [birth year]1999 MD  07/16/2016

## 2016-07-16 NOTE — Anesthesia Preprocedure Evaluation (Addendum)
Anesthesia Evaluation  Patient identified by MRN, date of birth, ID band Patient awake    Reviewed: Allergy & Precautions, H&P , NPO status , Patient's Chart, lab work & pertinent test results, reviewed documented beta blocker date and time   Airway Mallampati: II  TM Distance: >3 FB Neck ROM: Full    Dental no notable dental hx. (+) Teeth Intact, Dental Advisory Given   Pulmonary COPD, former smoker,    Pulmonary exam normal breath sounds clear to auscultation       Cardiovascular hypertension, Pt. on medications and Pt. on home beta blockers + CAD, + Past MI, + Cardiac Stents and + Peripheral Vascular Disease  + dysrhythmias Atrial Fibrillation + pacemaker  Rhythm:Regular Rate:Normal     Neuro/Psych Anxiety Depression negative neurological ROS     GI/Hepatic negative GI ROS, Neg liver ROS, GERD  Controlled,  Endo/Other  negative endocrine ROS  Renal/GU negative Renal ROS  negative genitourinary   Musculoskeletal  (+) Arthritis , Osteoarthritis,    Abdominal   Peds  Hematology negative hematology ROS (+)   Anesthesia Other Findings   Reproductive/Obstetrics negative OB ROS                            Anesthesia Physical Anesthesia Plan  ASA: IV  Anesthesia Plan: General   Post-op Pain Management:    Induction: Intravenous  Airway Management Planned: Oral ETT  Additional Equipment: Arterial line, CVP, PA Cath, TEE and Ultrasound Guidance Line Placement  Intra-op Plan:   Post-operative Plan: Post-operative intubation/ventilation  Informed Consent: I have reviewed the patients History and Physical, chart, labs and discussed the procedure including the risks, benefits and alternatives for the proposed anesthesia with the patient or authorized representative who has indicated his/her understanding and acceptance.   Dental advisory given  Plan Discussed with: CRNA  Anesthesia  Plan Comments:         Anesthesia Quick Evaluation

## 2016-07-17 ENCOUNTER — Inpatient Hospital Stay (HOSPITAL_COMMUNITY): Payer: Medicare Other | Admitting: Anesthesiology

## 2016-07-17 ENCOUNTER — Other Ambulatory Visit: Payer: Self-pay

## 2016-07-17 ENCOUNTER — Inpatient Hospital Stay (HOSPITAL_COMMUNITY): Payer: Medicare Other

## 2016-07-17 ENCOUNTER — Encounter (HOSPITAL_COMMUNITY)
Admission: EM | Disposition: A | Discharge status: Skilled Nursing Facility | Payer: Self-pay | Source: Home / Self Care | Attending: Cardiothoracic Surgery

## 2016-07-17 DIAGNOSIS — Z951 Presence of aortocoronary bypass graft: Secondary | ICD-10-CM

## 2016-07-17 DIAGNOSIS — I25110 Atherosclerotic heart disease of native coronary artery with unstable angina pectoris: Secondary | ICD-10-CM

## 2016-07-17 HISTORY — PX: CORONARY ARTERY BYPASS GRAFT: SHX141

## 2016-07-17 HISTORY — PX: TEE WITHOUT CARDIOVERSION: SHX5443

## 2016-07-17 LAB — POCT I-STAT 3, ART BLOOD GAS (G3+)
ACID-BASE DEFICIT: 3 mmol/L — AB (ref 0.0–2.0)
ACID-BASE DEFICIT: 3 mmol/L — AB (ref 0.0–2.0)
Acid-base deficit: 3 mmol/L — ABNORMAL HIGH (ref 0.0–2.0)
Acid-base deficit: 7 mmol/L — ABNORMAL HIGH (ref 0.0–2.0)
Acid-base deficit: 1 mmol/L (ref 0.0–2.0)
BICARBONATE: 23.1 mmol/L (ref 20.0–28.0)
BICARBONATE: 19.6 mmol/L — AB (ref 20.0–28.0)
BICARBONATE: 24.1 mmol/L (ref 20.0–28.0)
Bicarbonate: 22.6 mmol/L (ref 20.0–28.0)
Bicarbonate: 22.9 mmol/L (ref 20.0–28.0)
O2 SAT: 100 %
O2 SAT: 100 %
O2 SAT: 97 %
O2 SAT: 94 %
O2 Saturation: 92 %
PCO2 ART: 41.1 mmHg (ref 32.0–48.0)
PCO2 ART: 42.5 mmHg (ref 32.0–48.0)
PCO2 ART: 40.7 mmHg (ref 32.0–48.0)
PCO2 ART: 40.1 mmHg (ref 32.0–48.0)
PH ART: 7.339 — AB (ref 7.350–7.450)
PO2 ART: 311 mmHg — AB (ref 83.0–108.0)
PO2 ART: 91 mmHg (ref 83.0–108.0)
Patient temperature: 36.4
Patient temperature: 36.2
TCO2: 24 mmol/L (ref 0–100)
TCO2: 24 mmol/L (ref 0–100)
TCO2: 24 mmol/L (ref 0–100)
TCO2: 21 mmol/L (ref 0–100)
TCO2: 25 mmol/L (ref 0–100)
pCO2 arterial: 39.4 mmHg (ref 32.0–48.0)
pH, Arterial: 7.349 — ABNORMAL LOW (ref 7.350–7.450)
pH, Arterial: 7.367 (ref 7.350–7.450)
pH, Arterial: 7.288 — ABNORMAL LOW (ref 7.350–7.450)
pH, Arterial: 7.384 (ref 7.350–7.450)
pO2, Arterial: 425 mmHg — ABNORMAL HIGH (ref 83.0–108.0)
pO2, Arterial: 77 mmHg — ABNORMAL LOW (ref 83.0–108.0)
pO2, Arterial: 62 mmHg — ABNORMAL LOW (ref 83.0–108.0)

## 2016-07-17 LAB — GLUCOSE, CAPILLARY
GLUCOSE-CAPILLARY: 95 mg/dL (ref 65–99)
GLUCOSE-CAPILLARY: 106 mg/dL — AB (ref 65–99)
GLUCOSE-CAPILLARY: 120 mg/dL — AB (ref 65–99)
GLUCOSE-CAPILLARY: 128 mg/dL — AB (ref 65–99)
GLUCOSE-CAPILLARY: 106 mg/dL — AB (ref 65–99)
GLUCOSE-CAPILLARY: 118 mg/dL — AB (ref 65–99)
GLUCOSE-CAPILLARY: 165 mg/dL — AB (ref 65–99)
GLUCOSE-CAPILLARY: 140 mg/dL — AB (ref 65–99)
Glucose-Capillary: 102 mg/dL — ABNORMAL HIGH (ref 65–99)
Glucose-Capillary: 99 mg/dL (ref 65–99)

## 2016-07-17 LAB — POCT I-STAT, CHEM 8
BUN: 11 mg/dL (ref 6–20)
BUN: 11 mg/dL (ref 6–20)
BUN: 9 mg/dL (ref 6–20)
BUN: 10 mg/dL (ref 6–20)
BUN: 10 mg/dL (ref 6–20)
BUN: 9 mg/dL (ref 6–20)
CALCIUM ION: 1.26 mmol/L (ref 1.15–1.40)
CALCIUM ION: 0.96 mmol/L — AB (ref 1.15–1.40)
CALCIUM ION: 1.29 mmol/L (ref 1.15–1.40)
CALCIUM ION: 1.04 mmol/L — AB (ref 1.15–1.40)
CHLORIDE: 104 mmol/L (ref 101–111)
CHLORIDE: 101 mmol/L (ref 101–111)
CHLORIDE: 99 mmol/L — AB (ref 101–111)
CHLORIDE: 96 mmol/L — AB (ref 101–111)
CHLORIDE: 98 mmol/L — AB (ref 101–111)
CREATININE: 0.6 mg/dL (ref 0.44–1.00)
CREATININE: 0.8 mg/dL (ref 0.44–1.00)
CREATININE: 0.8 mg/dL (ref 0.44–1.00)
Calcium, Ion: 1.05 mmol/L — ABNORMAL LOW (ref 1.15–1.40)
Calcium, Ion: 1.18 mmol/L (ref 1.15–1.40)
Chloride: 103 mmol/L (ref 101–111)
Creatinine, Ser: 0.5 mg/dL (ref 0.44–1.00)
Creatinine, Ser: 0.8 mg/dL (ref 0.44–1.00)
Creatinine, Ser: 0.7 mg/dL (ref 0.44–1.00)
GLUCOSE: 86 mg/dL (ref 65–99)
GLUCOSE: 105 mg/dL — AB (ref 65–99)
GLUCOSE: 140 mg/dL — AB (ref 65–99)
GLUCOSE: 134 mg/dL — AB (ref 65–99)
GLUCOSE: 117 mg/dL — AB (ref 65–99)
Glucose, Bld: 105 mg/dL — ABNORMAL HIGH (ref 65–99)
HCT: 37 % (ref 36.0–46.0)
HCT: 26 % — ABNORMAL LOW (ref 36.0–46.0)
HCT: 35 % — ABNORMAL LOW (ref 36.0–46.0)
HCT: 26 % — ABNORMAL LOW (ref 36.0–46.0)
HEMATOCRIT: 24 % — AB (ref 36.0–46.0)
HEMATOCRIT: 33 % — AB (ref 36.0–46.0)
HEMOGLOBIN: 11.2 g/dL — AB (ref 12.0–15.0)
Hemoglobin: 12.6 g/dL (ref 12.0–15.0)
Hemoglobin: 11.9 g/dL — ABNORMAL LOW (ref 12.0–15.0)
Hemoglobin: 8.8 g/dL — ABNORMAL LOW (ref 12.0–15.0)
Hemoglobin: 8.2 g/dL — ABNORMAL LOW (ref 12.0–15.0)
Hemoglobin: 8.8 g/dL — ABNORMAL LOW (ref 12.0–15.0)
POTASSIUM: 3.9 mmol/L (ref 3.5–5.1)
POTASSIUM: 4.7 mmol/L (ref 3.5–5.1)
POTASSIUM: 3.9 mmol/L (ref 3.5–5.1)
POTASSIUM: 4.7 mmol/L (ref 3.5–5.1)
Potassium: 3.7 mmol/L (ref 3.5–5.1)
Potassium: 3.9 mmol/L (ref 3.5–5.1)
SODIUM: 137 mmol/L (ref 135–145)
SODIUM: 133 mmol/L — AB (ref 135–145)
SODIUM: 134 mmol/L — AB (ref 135–145)
Sodium: 139 mmol/L (ref 135–145)
Sodium: 136 mmol/L (ref 135–145)
Sodium: 136 mmol/L (ref 135–145)
TCO2: 22 mmol/L (ref 0–100)
TCO2: 23 mmol/L (ref 0–100)
TCO2: 24 mmol/L (ref 0–100)
TCO2: 25 mmol/L (ref 0–100)
TCO2: 22 mmol/L (ref 0–100)
TCO2: 24 mmol/L (ref 0–100)

## 2016-07-17 LAB — HEMOGLOBIN AND HEMATOCRIT, BLOOD
HCT: 24.4 % — ABNORMAL LOW (ref 36.0–46.0)
Hemoglobin: 8 g/dL — ABNORMAL LOW (ref 12.0–15.0)

## 2016-07-17 LAB — CREATININE, SERUM
Creatinine, Ser: 0.84 mg/dL (ref 0.44–1.00)
GFR calc Af Amer: >60 mL/min (ref >60)
GFR calc non Af Amer: >60 mL/min (ref >60)

## 2016-07-17 LAB — CBC
HCT: 40.5 % (ref 36.0–46.0)
HCT: 30.8 % — ABNORMAL LOW (ref 36.0–46.0)
HEMATOCRIT: 30.1 % — AB (ref 36.0–46.0)
HEMOGLOBIN: 13.5 g/dL (ref 12.0–15.0)
Hemoglobin: 9.9 g/dL — ABNORMAL LOW (ref 12.0–15.0)
Hemoglobin: 10.2 g/dL — ABNORMAL LOW (ref 12.0–15.0)
MCH: 25.6 pg — AB (ref 26.0–34.0)
MCH: 25.4 pg — AB (ref 26.0–34.0)
MCH: 25.7 pg — ABNORMAL LOW (ref 26.0–34.0)
MCHC: 33.3 g/dL (ref 30.0–36.0)
MCHC: 32.9 g/dL (ref 30.0–36.0)
MCHC: 33.1 g/dL (ref 30.0–36.0)
MCV: 76.7 fL — AB (ref 78.0–100.0)
MCV: 77.4 fL — AB (ref 78.0–100.0)
MCV: 77.6 fL — ABNORMAL LOW (ref 78.0–100.0)
PLATELETS: 135 10*3/uL — AB (ref 150–400)
Platelets: 296 10*3/uL (ref 150–400)
Platelets: 140 10*3/uL — ABNORMAL LOW (ref 150–400)
RBC: 5.28 MIL/uL — AB (ref 3.87–5.11)
RBC: 3.89 MIL/uL (ref 3.87–5.11)
RBC: 3.97 MIL/uL (ref 3.87–5.11)
RDW: 15 % (ref 11.5–15.5)
RDW: 15.1 % (ref 11.5–15.5)
RDW: 15.1 % (ref 11.5–15.5)
WBC: 4.9 10*3/uL (ref 4.0–10.5)
WBC: 12 10*3/uL — ABNORMAL HIGH (ref 4.0–10.5)
WBC: 11.2 10*3/uL — ABNORMAL HIGH (ref 4.0–10.5)

## 2016-07-17 LAB — BASIC METABOLIC PANEL
Anion gap: 8 (ref 5–15)
BUN: 13 mg/dL (ref 6–20)
CO2: 21 mmol/L — ABNORMAL LOW (ref 22–32)
Calcium: 8.9 mg/dL (ref 8.9–10.3)
Chloride: 104 mmol/L (ref 101–111)
Creatinine, Ser: 0.86 mg/dL (ref 0.44–1.00)
GFR calc Af Amer: >60 mL/min (ref >60)
GFR calc non Af Amer: >60 mL/min (ref >60)
Glucose, Bld: 105 mg/dL — ABNORMAL HIGH (ref 65–99)
Potassium: 4.4 mmol/L (ref 3.5–5.1)
Sodium: 133 mmol/L — ABNORMAL LOW (ref 135–145)

## 2016-07-17 LAB — HEPARIN LEVEL (UNFRACTIONATED): Heparin Unfractionated: 0.14 IU/mL — ABNORMAL LOW (ref 0.30–0.70)

## 2016-07-17 LAB — POCT I-STAT 4, (NA,K, GLUC, HGB,HCT)
GLUCOSE: 113 mg/dL — AB (ref 65–99)
HCT: 33 % — ABNORMAL LOW (ref 36.0–46.0)
HEMOGLOBIN: 11.2 g/dL — AB (ref 12.0–15.0)
POTASSIUM: 3.9 mmol/L (ref 3.5–5.1)
SODIUM: 137 mmol/L (ref 135–145)

## 2016-07-17 LAB — ECHO TEE
MV Annulus VTI: 22.9 cm
MV M vel: 34.5
Mean grad: 1 mmHg

## 2016-07-17 LAB — MAGNESIUM: Magnesium: 3 mg/dL — ABNORMAL HIGH (ref 1.7–2.4)

## 2016-07-17 LAB — PROTIME-INR
INR: 1.54
Prothrombin Time: 18.7 seconds — ABNORMAL HIGH (ref 11.4–15.2)

## 2016-07-17 LAB — PLATELET COUNT: Platelets: 151 10*3/uL (ref 150–400)

## 2016-07-17 LAB — APTT: APTT: 33 s (ref 24–36)

## 2016-07-17 SURGERY — CORONARY ARTERY BYPASS GRAFTING (CABG)
Anesthesia: General | Site: Chest

## 2016-07-17 MED ORDER — ROCURONIUM BROMIDE 10 MG/ML (PF) SYRINGE
PREFILLED_SYRINGE | INTRAVENOUS | Status: AC
  Filled 2016-07-17: qty 10

## 2016-07-17 MED ORDER — INSULIN REGULAR BOLUS VIA INFUSION
0.0000 [IU] | Freq: Three times a day (TID) | INTRAVENOUS | Status: DC
  Filled 2016-07-17: qty 10

## 2016-07-17 MED ORDER — METOPROLOL TARTRATE 5 MG/5ML IV SOLN: 2.5000 mg | INTRAVENOUS | Status: DC | PRN

## 2016-07-17 MED ORDER — FAMOTIDINE IN NACL 20-0.9 MG/50ML-% IV SOLN
20.0000 mg | Freq: Two times a day (BID) | INTRAVENOUS | Status: AC
Start: 2016-07-17 — End: 2016-07-18
  Administered 2016-07-17: 20 mg via INTRAVENOUS

## 2016-07-17 MED ORDER — SODIUM CHLORIDE 0.9 % IJ SOLN
OROMUCOSAL | Status: DC | PRN
  Administered 2016-07-17: 09:00:00 via TOPICAL

## 2016-07-17 MED ORDER — MIDAZOLAM HCL 10 MG/2ML IJ SOLN
INTRAMUSCULAR | Status: AC
Start: 2016-07-17 — End: 2016-07-17
  Filled 2016-07-17: qty 2

## 2016-07-17 MED ORDER — EPHEDRINE 5 MG/ML INJ
INTRAVENOUS | Status: AC
  Filled 2016-07-17: qty 10

## 2016-07-17 MED ORDER — ASPIRIN 81 MG PO CHEW: 324.0000 mg | CHEWABLE_TABLET | Freq: Every day | ORAL | Status: DC

## 2016-07-17 MED ORDER — MIDAZOLAM HCL 5 MG/5ML IJ SOLN
INTRAMUSCULAR | Status: DC | PRN
  Administered 2016-07-17: 2 mg via INTRAVENOUS
  Administered 2016-07-17: 3 mg via INTRAVENOUS
  Administered 2016-07-17: 5 mg via INTRAVENOUS

## 2016-07-17 MED ORDER — ORAL CARE MOUTH RINSE
15.0000 mL | Freq: Two times a day (BID) | OROMUCOSAL | Status: DC
  Administered 2016-07-18 – 2016-07-23 (×9): 15 mL via OROMUCOSAL

## 2016-07-17 MED ORDER — PROTAMINE SULFATE 10 MG/ML IV SOLN
INTRAVENOUS | Status: AC
  Filled 2016-07-17: qty 25

## 2016-07-17 MED ORDER — HYDRALAZINE HCL 20 MG/ML IJ SOLN: 10.0000 mg | INTRAMUSCULAR | Status: DC | PRN

## 2016-07-17 MED ORDER — ACETAMINOPHEN 650 MG RE SUPP
650.0000 mg | Freq: Once | RECTAL | Status: AC
  Administered 2016-07-17: 650 mg via RECTAL

## 2016-07-17 MED ORDER — LACTATED RINGERS IV SOLN: 500.0000 mL | Freq: Once | INTRAVENOUS | Status: DC | PRN

## 2016-07-17 MED ORDER — LACTATED RINGERS IV SOLN
INTRAVENOUS | Status: DC | PRN
  Administered 2016-07-17: 07:00:00 via INTRAVENOUS

## 2016-07-17 MED ORDER — ACETAMINOPHEN 500 MG PO TABS
1000.0000 mg | ORAL_TABLET | Freq: Four times a day (QID) | ORAL | Status: DC
  Administered 2016-07-18 – 2016-07-20 (×10): 1000 mg via ORAL
  Filled 2016-07-17 (×10): qty 2

## 2016-07-17 MED ORDER — MIDAZOLAM HCL 2 MG/2ML IJ SOLN
2.0000 mg | INTRAMUSCULAR | Status: AC | PRN
  Filled 2016-07-17: qty 2

## 2016-07-17 MED ORDER — SODIUM CHLORIDE 0.9 % IV SOLN
INTRAVENOUS | Status: DC
  Administered 2016-07-20: 04:00:00 via INTRAVENOUS

## 2016-07-17 MED ORDER — CHLORHEXIDINE GLUCONATE 0.12% ORAL RINSE (MEDLINE KIT)
15.0000 mL | Freq: Two times a day (BID) | OROMUCOSAL | Status: DC
  Administered 2016-07-17: 15 mL via OROMUCOSAL

## 2016-07-17 MED ORDER — POTASSIUM CHLORIDE 10 MEQ/50ML IV SOLN
10.0000 meq | INTRAVENOUS | Status: AC
Start: 2016-07-17 — End: 2016-07-17
  Administered 2016-07-17 (×2): 10 meq via INTRAVENOUS
  Filled 2016-07-17 (×3): qty 50

## 2016-07-17 MED ORDER — HEMOSTATIC AGENTS (NO CHARGE) OPTIME
TOPICAL | Status: DC | PRN
  Administered 2016-07-17 (×2): 1 via TOPICAL

## 2016-07-17 MED ORDER — ACETAMINOPHEN 160 MG/5ML PO SOLN: 1000.0000 mg | Freq: Four times a day (QID) | ORAL | Status: DC

## 2016-07-17 MED ORDER — SODIUM CHLORIDE 0.9 % IJ SOLN
INTRAMUSCULAR | Status: AC
  Filled 2016-07-17: qty 10

## 2016-07-17 MED ORDER — SODIUM BICARBONATE 8.4 % IV SOLN
100.0000 meq | Freq: Once | INTRAVENOUS | Status: AC
  Administered 2016-07-17: 100 meq via INTRAVENOUS

## 2016-07-17 MED ORDER — MILRINONE LACTATE IN DEXTROSE 20-5 MG/100ML-% IV SOLN
0.1250 ug/kg/min | INTRAVENOUS | Status: DC
  Administered 2016-07-18: 0.25 ug/kg/min via INTRAVENOUS
  Administered 2016-07-18: 0.3 ug/kg/min via INTRAVENOUS
  Administered 2016-07-20: 0.125 ug/kg/min via INTRAVENOUS
  Filled 2016-07-17 (×3): qty 100

## 2016-07-17 MED ORDER — SODIUM CHLORIDE 0.9 % IV SOLN: 250.0000 mL | INTRAVENOUS | Status: DC

## 2016-07-17 MED ORDER — LIDOCAINE 2% (20 MG/ML) 5 ML SYRINGE
INTRAMUSCULAR | Status: AC
  Filled 2016-07-17: qty 5

## 2016-07-17 MED ORDER — FENTANYL CITRATE (PF) 250 MCG/5ML IJ SOLN
INTRAMUSCULAR | Status: DC | PRN
  Administered 2016-07-17: 200 ug via INTRAVENOUS
  Administered 2016-07-17: 450 ug via INTRAVENOUS
  Administered 2016-07-17: 100 ug via INTRAVENOUS
  Administered 2016-07-17: 50 ug via INTRAVENOUS
  Administered 2016-07-17: 200 ug via INTRAVENOUS

## 2016-07-17 MED ORDER — MAGNESIUM SULFATE 4 GM/100ML IV SOLN
4.0000 g | Freq: Once | INTRAVENOUS | Status: AC
  Administered 2016-07-17: 4 g via INTRAVENOUS
  Filled 2016-07-17: qty 100

## 2016-07-17 MED ORDER — METOPROLOL TARTRATE 25 MG/10 ML ORAL SUSPENSION: 12.5000 mg | Freq: Two times a day (BID) | ORAL | Status: DC

## 2016-07-17 MED ORDER — MILRINONE LACTATE IN DEXTROSE 20-5 MG/100ML-% IV SOLN
0.1250 ug/kg/min | INTRAVENOUS | Status: AC
  Administered 2016-07-17: 0.25 ug/kg/min via INTRAVENOUS
  Filled 2016-07-17: qty 100

## 2016-07-17 MED ORDER — SODIUM CHLORIDE 0.45 % IV SOLN: INTRAVENOUS | Status: DC | PRN

## 2016-07-17 MED ORDER — PHENYLEPHRINE HCL 10 MG/ML IJ SOLN
0.0000 ug/min | INTRAVENOUS | Status: DC
  Filled 2016-07-17: qty 2

## 2016-07-17 MED ORDER — PROPOFOL 10 MG/ML IV BOLUS
INTRAVENOUS | Status: AC
  Filled 2016-07-17: qty 20

## 2016-07-17 MED ORDER — ARTIFICIAL TEARS OP OINT
TOPICAL_OINTMENT | OPHTHALMIC | Status: DC | PRN
  Administered 2016-07-17: 1 via OPHTHALMIC

## 2016-07-17 MED ORDER — HEPARIN SODIUM (PORCINE) 1000 UNIT/ML IJ SOLN
INTRAMUSCULAR | Status: AC
  Filled 2016-07-17: qty 1

## 2016-07-17 MED ORDER — VANCOMYCIN HCL IN DEXTROSE 1-5 GM/200ML-% IV SOLN
1000.0000 mg | Freq: Two times a day (BID) | INTRAVENOUS | Status: AC
  Administered 2016-07-17 – 2016-07-18 (×2): 1000 mg via INTRAVENOUS
  Filled 2016-07-17 (×2): qty 200

## 2016-07-17 MED ORDER — ROCURONIUM BROMIDE 100 MG/10ML IV SOLN
INTRAVENOUS | Status: DC | PRN
  Administered 2016-07-17: 30 mg via INTRAVENOUS
  Administered 2016-07-17 (×2): 50 mg via INTRAVENOUS
  Administered 2016-07-17: 70 mg via INTRAVENOUS

## 2016-07-17 MED ORDER — MORPHINE SULFATE (PF) 2 MG/ML IV SOLN
2.0000 mg | INTRAVENOUS | Status: DC | PRN
  Administered 2016-07-17 – 2016-07-18 (×2): 2 mg via INTRAVENOUS
  Filled 2016-07-17 (×2): qty 1

## 2016-07-17 MED ORDER — HEPARIN SODIUM (PORCINE) 1000 UNIT/ML IJ SOLN
INTRAMUSCULAR | Status: DC | PRN
  Administered 2016-07-17: 22 mL via INTRAVENOUS
  Administered 2016-07-17: 3 mL via INTRAVENOUS

## 2016-07-17 MED ORDER — ALBUMIN HUMAN 5 % IV SOLN
INTRAVENOUS | Status: DC | PRN
  Administered 2016-07-17: 13:00:00 via INTRAVENOUS

## 2016-07-17 MED ORDER — SODIUM CHLORIDE 0.9 % IV SOLN
INTRAVENOUS | Status: DC | PRN
  Administered 2016-07-17: .5 [IU]/h via INTRAVENOUS

## 2016-07-17 MED ORDER — TRAMADOL HCL 50 MG PO TABS: 50.0000 mg | ORAL_TABLET | ORAL | Status: DC | PRN

## 2016-07-17 MED ORDER — CHLORHEXIDINE GLUCONATE 0.12 % MT SOLN
15.0000 mL | OROMUCOSAL | Status: AC
  Administered 2016-07-17: 15 mL via OROMUCOSAL

## 2016-07-17 MED ORDER — ORAL CARE MOUTH RINSE: 15.0000 mL | OROMUCOSAL | Status: DC

## 2016-07-17 MED ORDER — FENTANYL CITRATE (PF) 250 MCG/5ML IJ SOLN
INTRAMUSCULAR | Status: AC
  Filled 2016-07-17: qty 20

## 2016-07-17 MED ORDER — ROSUVASTATIN CALCIUM 10 MG PO TABS
40.0000 mg | ORAL_TABLET | Freq: Every day | ORAL | Status: DC
Start: 2016-07-18 — End: 2016-07-24
  Administered 2016-07-18 – 2016-07-24 (×7): 40 mg via ORAL
  Filled 2016-07-17 (×2): qty 4
  Filled 2016-07-17 (×2): qty 1
  Filled 2016-07-17: qty 2
  Filled 2016-07-17: qty 4
  Filled 2016-07-17: qty 1
  Filled 2016-07-17 (×2): qty 4
  Filled 2016-07-17: qty 2

## 2016-07-17 MED ORDER — SODIUM CHLORIDE 0.9% FLUSH
3.0000 mL | Freq: Two times a day (BID) | INTRAVENOUS | Status: DC
  Administered 2016-07-18 – 2016-07-19 (×3): 3 mL via INTRAVENOUS

## 2016-07-17 MED ORDER — NITROGLYCERIN IN D5W 200-5 MCG/ML-% IV SOLN: 0.0000 ug/min | INTRAVENOUS | Status: DC

## 2016-07-17 MED ORDER — DEXMEDETOMIDINE HCL IN NACL 200 MCG/50ML IV SOLN
0.0000 ug/kg/h | INTRAVENOUS | Status: DC
  Administered 2016-07-17: 0.3 ug/kg/h via INTRAVENOUS
  Filled 2016-07-17: qty 50

## 2016-07-17 MED ORDER — VANCOMYCIN HCL IN DEXTROSE 1-5 GM/200ML-% IV SOLN
1000.0000 mg | Freq: Once | INTRAVENOUS | Status: DC
  Filled 2016-07-17: qty 200

## 2016-07-17 MED ORDER — LEVALBUTEROL HCL 0.63 MG/3ML IN NEBU: 0.6300 mg | INHALATION_SOLUTION | Freq: Four times a day (QID) | RESPIRATORY_TRACT | Status: DC | PRN

## 2016-07-17 MED ORDER — SODIUM CHLORIDE 0.9% FLUSH: 3.0000 mL | INTRAVENOUS | Status: DC | PRN

## 2016-07-17 MED ORDER — SUCCINYLCHOLINE CHLORIDE 200 MG/10ML IV SOSY
PREFILLED_SYRINGE | INTRAVENOUS | Status: AC
  Filled 2016-07-17: qty 10

## 2016-07-17 MED ORDER — OXYCODONE HCL 5 MG PO TABS
5.0000 mg | ORAL_TABLET | ORAL | Status: DC | PRN
  Administered 2016-07-18: 10 mg via ORAL
  Administered 2016-07-18: 5 mg via ORAL
  Filled 2016-07-17: qty 2
  Filled 2016-07-17: qty 1

## 2016-07-17 MED ORDER — PROPOFOL 10 MG/ML IV BOLUS
INTRAVENOUS | Status: DC | PRN
  Administered 2016-07-17: 40 mg via INTRAVENOUS

## 2016-07-17 MED ORDER — ACETAMINOPHEN 160 MG/5ML PO SOLN: 650.0000 mg | Freq: Once | ORAL | Status: AC

## 2016-07-17 MED ORDER — MOMETASONE FURO-FORMOTEROL FUM 100-5 MCG/ACT IN AERO
2.0000 | INHALATION_SPRAY | Freq: Two times a day (BID) | RESPIRATORY_TRACT | Status: DC
  Administered 2016-07-18 – 2016-07-23 (×9): 2 via RESPIRATORY_TRACT
  Filled 2016-07-17 (×3): qty 8.8

## 2016-07-17 MED ORDER — DOCUSATE SODIUM 100 MG PO CAPS
200.0000 mg | ORAL_CAPSULE | Freq: Every day | ORAL | Status: DC
  Administered 2016-07-18 – 2016-07-20 (×3): 200 mg via ORAL
  Filled 2016-07-17 (×3): qty 2

## 2016-07-17 MED ORDER — PHENYLEPHRINE HCL 10 MG/ML IJ SOLN
INTRAVENOUS | Status: DC | PRN
  Administered 2016-07-17: 40 ug/min via INTRAVENOUS

## 2016-07-17 MED ORDER — BISACODYL 10 MG RE SUPP: 10.0000 mg | Freq: Every day | RECTAL | Status: DC

## 2016-07-17 MED ORDER — DEXTROSE 5 % IV SOLN
0.0000 ug/kg/min | INTRAVENOUS | Status: DC
  Filled 2016-07-17: qty 2

## 2016-07-17 MED ORDER — METOPROLOL TARTRATE 12.5 MG HALF TABLET
12.5000 mg | ORAL_TABLET | Freq: Two times a day (BID) | ORAL | Status: DC
  Administered 2016-07-18 – 2016-07-20 (×4): 12.5 mg via ORAL
  Filled 2016-07-17 (×4): qty 1

## 2016-07-17 MED ORDER — 0.9 % SODIUM CHLORIDE (POUR BTL) OPTIME
TOPICAL | Status: DC | PRN
  Administered 2016-07-17: 6000 mL

## 2016-07-17 MED ORDER — DEXTROSE 5 % IV SOLN
1.5000 g | Freq: Two times a day (BID) | INTRAVENOUS | Status: AC
  Administered 2016-07-17 – 2016-07-19 (×4): 1.5 g via INTRAVENOUS
  Filled 2016-07-17 (×4): qty 1.5

## 2016-07-17 MED ORDER — BISACODYL 5 MG PO TBEC
10.0000 mg | DELAYED_RELEASE_TABLET | Freq: Every day | ORAL | Status: DC
  Administered 2016-07-18 – 2016-07-20 (×3): 10 mg via ORAL
  Filled 2016-07-17 (×3): qty 2

## 2016-07-17 MED ORDER — PHENYLEPHRINE HCL 10 MG/ML IJ SOLN
INTRAMUSCULAR | Status: DC | PRN
  Administered 2016-07-17: 40 ug via INTRAVENOUS

## 2016-07-17 MED ORDER — SODIUM CHLORIDE 0.9 % IV SOLN
20.0000 ug | INTRAVENOUS | Status: AC
  Administered 2016-07-17: 20 ug via INTRAVENOUS
  Filled 2016-07-17: qty 5

## 2016-07-17 MED ORDER — ONDANSETRON HCL 4 MG/2ML IJ SOLN
4.0000 mg | Freq: Four times a day (QID) | INTRAMUSCULAR | Status: DC | PRN
  Administered 2016-07-17 – 2016-07-18 (×2): 4 mg via INTRAVENOUS
  Filled 2016-07-17 (×2): qty 2

## 2016-07-17 MED ORDER — PHENYLEPHRINE 40 MCG/ML (10ML) SYRINGE FOR IV PUSH (FOR BLOOD PRESSURE SUPPORT)
PREFILLED_SYRINGE | INTRAVENOUS | Status: AC
  Filled 2016-07-17: qty 10

## 2016-07-17 MED ORDER — LACTATED RINGERS IV SOLN: INTRAVENOUS | Status: DC

## 2016-07-17 MED ORDER — ASPIRIN EC 325 MG PO TBEC
325.0000 mg | DELAYED_RELEASE_TABLET | Freq: Every day | ORAL | Status: DC
  Administered 2016-07-18 – 2016-07-20 (×3): 325 mg via ORAL
  Filled 2016-07-17 (×3): qty 1

## 2016-07-17 MED ORDER — ALBUMIN HUMAN 5 % IV SOLN
250.0000 mL | INTRAVENOUS | Status: AC | PRN
  Administered 2016-07-17: 250 mL via INTRAVENOUS
  Filled 2016-07-17: qty 250

## 2016-07-17 MED ORDER — MORPHINE SULFATE (PF) 2 MG/ML IV SOLN
1.0000 mg | INTRAVENOUS | Status: AC | PRN
  Administered 2016-07-17: 2 mg via INTRAVENOUS
  Filled 2016-07-17: qty 1

## 2016-07-17 MED ORDER — PROTAMINE SULFATE 10 MG/ML IV SOLN
INTRAVENOUS | Status: DC | PRN
  Administered 2016-07-17: 250 mg via INTRAVENOUS

## 2016-07-17 MED ORDER — PANTOPRAZOLE SODIUM 40 MG PO TBEC
40.0000 mg | DELAYED_RELEASE_TABLET | Freq: Every day | ORAL | Status: DC
  Administered 2016-07-19 – 2016-07-20 (×2): 40 mg via ORAL
  Filled 2016-07-17 (×4): qty 1

## 2016-07-17 MED ORDER — DEXMEDETOMIDINE HCL 200 MCG/2ML IV SOLN
INTRAVENOUS | Status: DC | PRN
  Administered 2016-07-17: .2 ug/kg/h via INTRAVENOUS

## 2016-07-17 MED ORDER — SODIUM CHLORIDE 0.9 % IV SOLN
INTRAVENOUS | Status: DC
  Administered 2016-07-17: 1.4 [IU]/h via INTRAVENOUS
  Filled 2016-07-17: qty 2.5

## 2016-07-17 MED ORDER — ARTIFICIAL TEARS OP OINT
TOPICAL_OINTMENT | OPHTHALMIC | Status: AC
  Filled 2016-07-17: qty 3.5

## 2016-07-17 MED FILL — Nitroglycerin IV Soln 100 MCG/ML in D5W: INTRA_ARTERIAL | Qty: 10 | Status: AC

## 2016-07-17 MED FILL — Magnesium Sulfate Inj 50%: INTRAMUSCULAR | Qty: 10 | Status: AC

## 2016-07-17 MED FILL — Potassium Chloride Inj 2 mEq/ML: INTRAVENOUS | Qty: 40 | Status: AC

## 2016-07-17 MED FILL — Heparin Sodium (Porcine) Inj 1000 Unit/ML: INTRAMUSCULAR | Qty: 30 | Status: AC

## 2016-07-17 SURGICAL SUPPLY — 100 items
ADAPTER CARDIO PERF ANTE/RETRO (ADAPTER) ×4 IMPLANT
BAG DECANTER FOR FLEXI CONT (MISCELLANEOUS) ×4 IMPLANT
BANDAGE ACE 4X5 VEL STRL LF (GAUZE/BANDAGES/DRESSINGS) ×4 IMPLANT
BANDAGE ACE 6X5 VEL STRL LF (GAUZE/BANDAGES/DRESSINGS) ×4 IMPLANT
BASKET HEART  (ORDER IN 25'S) (MISCELLANEOUS) ×1
BASKET HEART (ORDER IN 25'S) (MISCELLANEOUS) ×1
BASKET HEART (ORDER IN 25S) (MISCELLANEOUS) ×2 IMPLANT
BLADE STERNUM SYSTEM 6 (BLADE) ×4 IMPLANT
BLADE SURG 11 STRL SS (BLADE) ×4 IMPLANT
BLADE SURG 12 STRL SS (BLADE) ×4 IMPLANT
BLADE SURG ROTATE 9660 (MISCELLANEOUS) IMPLANT
BNDG GAUZE ELAST 4 BULKY (GAUZE/BANDAGES/DRESSINGS) ×4 IMPLANT
CANISTER SUCTION 2500CC (MISCELLANEOUS) ×4 IMPLANT
CANNULA GUNDRY RCSP 15FR (MISCELLANEOUS) ×4 IMPLANT
CATH CPB KIT VANTRIGT (MISCELLANEOUS) ×4 IMPLANT
CATH ROBINSON RED A/P 18FR (CATHETERS) ×12 IMPLANT
CATH THORACIC 36FR RT ANG (CATHETERS) ×4 IMPLANT
CLIP RETRACTION 3.0MM CORONARY (MISCELLANEOUS) ×4 IMPLANT
CLIP TI WIDE RED SMALL 24 (CLIP) ×4 IMPLANT
CRADLE DONUT ADULT HEAD (MISCELLANEOUS) ×4 IMPLANT
DRAIN CHANNEL 32F RND 10.7 FF (WOUND CARE) ×4 IMPLANT
DRAPE CARDIOVASCULAR INCISE (DRAPES) ×2
DRAPE SLUSH/WARMER DISC (DRAPES) ×4 IMPLANT
DRAPE SRG 135X102X78XABS (DRAPES) ×2 IMPLANT
DRSG AQUACEL AG ADV 3.5X14 (GAUZE/BANDAGES/DRESSINGS) ×4 IMPLANT
ELECT BLADE 4.0 EZ CLEAN MEGAD (MISCELLANEOUS) ×4
ELECT BLADE 6.5 EXT (BLADE) ×4 IMPLANT
ELECT CAUTERY BLADE 6.4 (BLADE) ×4 IMPLANT
ELECT REM PT RETURN 9FT ADLT (ELECTROSURGICAL) ×8
ELECTRODE BLDE 4.0 EZ CLN MEGD (MISCELLANEOUS) ×2 IMPLANT
ELECTRODE REM PT RTRN 9FT ADLT (ELECTROSURGICAL) ×4 IMPLANT
FELT TEFLON 1X6 (MISCELLANEOUS) ×8 IMPLANT
GAUZE SPONGE 4X4 12PLY STRL (GAUZE/BANDAGES/DRESSINGS) ×8 IMPLANT
GLOVE BIO SURGEON STRL SZ 6 (GLOVE) ×8 IMPLANT
GLOVE BIO SURGEON STRL SZ7.5 (GLOVE) ×12 IMPLANT
GLOVE BIOGEL M 6.5 STRL (GLOVE) ×8 IMPLANT
GLOVE BIOGEL M 7.0 STRL (GLOVE) ×16 IMPLANT
GLOVE BIOGEL PI IND STRL 6.5 (GLOVE) ×4 IMPLANT
GLOVE BIOGEL PI IND STRL 7.0 (GLOVE) ×6 IMPLANT
GLOVE BIOGEL PI INDICATOR 6.5 (GLOVE) ×4
GLOVE BIOGEL PI INDICATOR 7.0 (GLOVE) ×6
GOWN STRL REUS W/ TWL LRG LVL3 (GOWN DISPOSABLE) ×20 IMPLANT
GOWN STRL REUS W/TWL LRG LVL3 (GOWN DISPOSABLE) ×20
HEMOSTAT POWDER SURGIFOAM 1G (HEMOSTASIS) ×12 IMPLANT
HEMOSTAT SURGICEL 2X14 (HEMOSTASIS) ×4 IMPLANT
INSERT FOGARTY XLG (MISCELLANEOUS) IMPLANT
KIT BASIN OR (CUSTOM PROCEDURE TRAY) ×4 IMPLANT
KIT ROOM TURNOVER OR (KITS) ×4 IMPLANT
KIT SUCTION CATH 14FR (SUCTIONS) ×4 IMPLANT
KIT VASOVIEW HEMOPRO VH 3000 (KITS) ×4 IMPLANT
LEAD PACING MYOCARDI (MISCELLANEOUS) ×4 IMPLANT
MARKER GRAFT CORONARY BYPASS (MISCELLANEOUS) ×12 IMPLANT
NS IRRIG 1000ML POUR BTL (IV SOLUTION) ×24 IMPLANT
PACK OPEN HEART (CUSTOM PROCEDURE TRAY) ×4 IMPLANT
PAD ARMBOARD 7.5X6 YLW CONV (MISCELLANEOUS) ×8 IMPLANT
PAD ELECT DEFIB RADIOL ZOLL (MISCELLANEOUS) ×4 IMPLANT
PENCIL BUTTON HOLSTER BLD 10FT (ELECTRODE) ×4 IMPLANT
PUNCH AORTIC ROTATE  4.5MM 8IN (MISCELLANEOUS) ×4 IMPLANT
PUNCH AORTIC ROTATE 4.0MM (MISCELLANEOUS) IMPLANT
PUNCH AORTIC ROTATE 4.5MM 8IN (MISCELLANEOUS) IMPLANT
PUNCH AORTIC ROTATE 5MM 8IN (MISCELLANEOUS) IMPLANT
SET CARDIOPLEGIA MPS 5001102 (MISCELLANEOUS) ×4 IMPLANT
SPONGE LAP 18X18 X RAY DECT (DISPOSABLE) ×4 IMPLANT
SPONGE LAP 4X18 X RAY DECT (DISPOSABLE) ×4 IMPLANT
SURGIFLO W/THROMBIN 8M KIT (HEMOSTASIS) ×4 IMPLANT
SUT BONE WAX W31G (SUTURE) ×4 IMPLANT
SUT MNCRL AB 4-0 PS2 18 (SUTURE) ×4 IMPLANT
SUT PROLENE 3 0 SH DA (SUTURE) IMPLANT
SUT PROLENE 3 0 SH1 36 (SUTURE) IMPLANT
SUT PROLENE 4 0 RB 1 (SUTURE) ×2
SUT PROLENE 4 0 SH DA (SUTURE) ×4 IMPLANT
SUT PROLENE 4-0 RB1 .5 CRCL 36 (SUTURE) ×2 IMPLANT
SUT PROLENE 5 0 C 1 36 (SUTURE) IMPLANT
SUT PROLENE 6 0 C 1 30 (SUTURE) ×20 IMPLANT
SUT PROLENE 6 0 CC (SUTURE) ×12 IMPLANT
SUT PROLENE 8 0 BV175 6 (SUTURE) ×8 IMPLANT
SUT PROLENE BLUE 7 0 (SUTURE) ×8 IMPLANT
SUT SILK  1 MH (SUTURE)
SUT SILK 1 MH (SUTURE) IMPLANT
SUT SILK 2 0 SH CR/8 (SUTURE) ×4 IMPLANT
SUT SILK 3 0 SH CR/8 (SUTURE) ×4 IMPLANT
SUT STEEL 6MS V (SUTURE) ×8 IMPLANT
SUT STEEL SZ 6 DBL 3X14 BALL (SUTURE) ×4 IMPLANT
SUT VIC AB 1 CTX 27 (SUTURE) ×4 IMPLANT
SUT VIC AB 1 CTX 36 (SUTURE) ×4
SUT VIC AB 1 CTX36XBRD ANBCTR (SUTURE) ×4 IMPLANT
SUT VIC AB 2-0 CT1 27 (SUTURE) ×2
SUT VIC AB 2-0 CT1 TAPERPNT 27 (SUTURE) ×2 IMPLANT
SUT VIC AB 2-0 CTX 27 (SUTURE) IMPLANT
SUT VIC AB 3-0 X1 27 (SUTURE) IMPLANT
SUTURE E-PAK OPEN HEART (SUTURE) ×4 IMPLANT
SYSTEM SAHARA CHEST DRAIN ATS (WOUND CARE) ×4 IMPLANT
TAPE CLOTH SURG 6X10 WHT LF (GAUZE/BANDAGES/DRESSINGS) ×4 IMPLANT
TAPE PAPER 2X10 WHT MICROPORE (GAUZE/BANDAGES/DRESSINGS) ×4 IMPLANT
TOWEL OR 17X24 6PK STRL BLUE (TOWEL DISPOSABLE) ×8 IMPLANT
TOWEL OR 17X26 10 PK STRL BLUE (TOWEL DISPOSABLE) ×8 IMPLANT
TRAY FOLEY IC TEMP SENS 16FR (CATHETERS) ×4 IMPLANT
TUBING INSUFFLATION (TUBING) ×4 IMPLANT
UNDERPAD 30X30 (UNDERPADS AND DIAPERS) ×4 IMPLANT
WATER STERILE IRR 1000ML POUR (IV SOLUTION) ×8 IMPLANT

## 2016-07-17 NOTE — Progress Notes (Signed)
Patient ID: Mercedes [name], female   DOB: Jul 16, 1956, 60 y.o.   MRN: [id number]   SICU Evening Rounds:   Hemodynamically stable  CI = 1.9 on Milrinone 0.3  Has started to wake up on vent. Starting to wean   Urine output good  CT output low  CBC    Component Value Date/Time   WBC 11.2 (H) 07/17/2016 2000   RBC 3.97 07/17/2016 2000   HGB 10.2 (L) 07/17/2016 2000   HCT 30.8 (L) 07/17/2016 2000   PLT 140 (L) 07/17/2016 2000   MCV 77.6 (L) 07/17/2016 2000   MCH 25.7 (L) 07/17/2016 2000   MCHC 33.1 07/17/2016 2000   RDW 15.1 07/17/2016 2000   LYMPHSABS 2,400 05/10/2016 1003   MONOABS 540 05/10/2016 1003   EOSABS 300 05/10/2016 1003   BASOSABS 120 05/10/2016 1003     BMET    Component Value Date/Time   NA 137 07/17/2016 1353   NA 138 05/12/2013   K 3.9 07/17/2016 1353   CL 98 (L) 07/17/2016 1245   CO2 21 (L) 07/17/2016 0425   GLUCOSE 113 (H) 07/17/2016 1353   BUN 10 07/17/2016 1245   BUN 6 05/12/2013   CREATININE 0.70 07/17/2016 1245   CREATININE 0.82 05/10/2016 1003   CALCIUM 8.9 07/17/2016 0425   GFRNONAA >60 07/17/2016 0425   GFRAA >60 07/17/2016 0425     A/P:  Stable postop course. Continue current plans

## 2016-07-17 NOTE — Progress Notes (Signed)
Pt passed pulmonary mechanics good effort. No distress or complications noted  NIF -30 VC 1.35

## 2016-07-17 NOTE — Progress Notes (Signed)
Per RN no wean until arterial BP is more stable.

## 2016-07-17 NOTE — Progress Notes (Signed)
The patient was examined and preop studies reviewed. There has been no change from the prior exam and the patient is ready for surgery.  plan CABG and LA clip[ on R Mercedes [name]- benefits and risks of surgey have been discussed with patient

## 2016-07-17 NOTE — Progress Notes (Addendum)
Per MD, do not extubate until CI improves, will hold off on albumin, and start NIPRIDE. SBP goal per MD 130

## 2016-07-17 NOTE — Transfer of Care (Signed)
Immediate Anesthesia Transfer of Care Note  Patient: Mercedes [name]  Procedure(s) Performed: Procedure(s): CORONARY ARTERY BYPASS GRAFTING (CABG)x3 using right greater saphenous vein harvested endoscopiclly and left internal mammary artery (N/A) TRANSESOPHAGEAL ECHOCARDIOGRAM (TEE) (N/A)  Patient Location: SICU  Anesthesia Type:General  Level of Consciousness: Patient remains intubated per anesthesia plan  Airway & Oxygen Therapy: Patient remains intubated per anesthesia plan and Patient placed on Ventilator (see vital sign flow sheet for setting)  Post-op Assessment: Report given to RN and Post -op Vital signs reviewed and stable  Post vital signs: Reviewed and stable  Last Vitals:  Vitals:   07/17/16 0538 07/17/16 1344  BP: 106/73 123/78  Pulse: (!) 59 86  Resp: 17 12  Temp:      Last Pain:  Vitals:   07/17/16 0430  TempSrc:   PainSc: 0-No pain      Patients Stated Pain Goal: 0 (07/16/16 1946)  Complications: No apparent anesthesia complications

## 2016-07-17 NOTE — Progress Notes (Signed)
Patient bathed with Hibiclens for the second time. New bed sheets, gown, lead stickers, socks, BP cuff, pulse-ox, and SCD's applied. Fingernails socked in Hibiclens. Mouth swabbed with Chlorhexidine and patient swished and spitted after taking Metoprolol and Valium PO as prescribed. CBG was 98. Patient urinated at 0520. Sacral foam dressing applied. Patient has been NPO since 2131. Last food was yesterday at dinner time. Patient A/O X 4. No acute distress. All personal items dent home with granddaughter last night except for black bonnet. Granddaughter took patients dentures, clothes, and jewelry. Will send one belongings bag to [address] [facility] that has hygiene products from hospital and bonnet. Will call report to OR around 0600 as instructed.

## 2016-07-17 NOTE — Progress Notes (Signed)
Per MD will not extubate until BP (hypertension) and low CI improve and stay stable

## 2016-07-17 NOTE — Anesthesia Postprocedure Evaluation (Signed)
Anesthesia Post Note  Patient: Mercedes [name]  Procedure(s) Performed: Procedure(s) (LRB): CORONARY ARTERY BYPASS GRAFTING (CABG)x3 using right greater saphenous vein harvested endoscopiclly and left internal mammary artery (N/A) TRANSESOPHAGEAL ECHOCARDIOGRAM (TEE) (N/A)  Patient location during evaluation: SICU Anesthesia Type: General Level of consciousness: sedated Pain management: pain level controlled Vital Signs Assessment: post-procedure vital signs reviewed and stable Respiratory status: patient remains intubated per anesthesia plan Cardiovascular status: stable Anesthetic complications: no    Last Vitals:  Vitals:   07/17/16 0538 07/17/16 1344  BP: 106/73 123/78  Pulse: (!) 59 86  Resp: 17 12  Temp:      Last Pain:  Vitals:   07/17/16 0430  TempSrc:   PainSc: 0-No pain                 Dorion Petillo,W. EDMOND

## 2016-07-17 NOTE — Progress Notes (Addendum)
Patient SBP in the 190s, patient started to awake, PA increased, NTG increased to briefly, morphine 2mg  given. Will monitor

## 2016-07-17 NOTE — Progress Notes (Deleted)
Pt is still hemodynamically stable CI = 2.2 at this time, pt is now on CPAP/PSV tolerating it well. Pt is pulling good volumes, and minute ventilation is also good. Pt is stable at this time no distress or complications noted. 

## 2016-07-17 NOTE — Progress Notes (Signed)
Called Dr. Barry Dienes (CTS) provider on call to report patients BP 89/57 and HR 59. Doesn't want to give any fluids since she is symptomatic. Still wants her to receive Metoprolol ON-CALL to OR. Will continue to monitor.

## 2016-07-17 NOTE — Brief Op Note (Signed)
07/11/2016 - 07/17/2016  12:07 PM  PATIENT:  Mercedes [name]  60 y.o. female  PRE-OPERATIVE DIAGNOSIS:  CAD  POST-OPERATIVE DIAGNOSIS:  coronary artery disease, angina  PROCEDURE:  Procedure(s): CORONARY ARTERY BYPASS GRAFTING (CABG)x3 using right greater saphenous vein harvested endoscopiclly and left internal mammary artery (N/A) TRANSESOPHAGEAL ECHOCARDIOGRAM (TEE) (N/A)  SURGEON:  Surgeon(s) and Role:    * Kerin Perna, MD - Primary  PHYSICIAN ASSISTANT:   Jari Favre, PA-C  ANESTHESIA:   general  EBL:  Total I/O In: 1400 [I.V.:1400] Out: 400 [Urine:400]  BLOOD ADMINISTERED:none  DRAINS: ROUTINE   LOCAL MEDICATIONS USED:  NONE  SPECIMEN:  No Specimen  DISPOSITION OF SPECIMEN:  N/A  COUNTS:  YES  TOURNIQUET:  * No tourniquets in log *  DICTATION: .Other Dictation: Dictation Number pending  PLAN OF CARE: Admit to inpatient   PATIENT DISPOSITION:  ICU - intubated and hemodynamically stable.   Delay start of Pharmacological VTE agent (>24hrs) due to surgical blood loss or risk of bleeding: yes

## 2016-07-17 NOTE — Progress Notes (Signed)
Pt is still hemodynamically stable CI = 2.2 at this time, pt is now on CPAP/PSV tolerating it well. Pt is pulling good volumes, and minute ventilation is also good. Pt is stable at this time no distress or complications noted.

## 2016-07-17 NOTE — Anesthesia Procedure Notes (Signed)
Procedure Name: Intubation Date/Time: 07/17/2016 8:03 AM Performed by: Orvilla Fus A Pre-anesthesia Checklist: Patient identified, Emergency Drugs available, Suction available and Patient being monitored Patient Re-evaluated:Patient Re-evaluated prior to inductionOxygen Delivery Method: Circle System Utilized Preoxygenation: Pre-oxygenation with 100% oxygen Intubation Type: IV induction Ventilation: Mask ventilation without difficulty Laryngoscope Size: Mac and 3 Grade View: Grade I Tube type: Oral Tube size: 7.5 mm Number of attempts: 1 Airway Equipment and Method: Stylet and Oral airway Placement Confirmation: ETT inserted through vocal cords under direct vision,  positive ETCO2 and breath sounds checked- equal and bilateral Secured at: 21 cm Tube secured with: Tape Dental Injury: Teeth and Oropharynx as per pre-operative assessment

## 2016-07-17 NOTE — Progress Notes (Signed)
  Echocardiogram Echocardiogram Transesophageal has been performed.  Janalyn Harder 07/17/2016, 9:38 AM

## 2016-07-17 NOTE — Anesthesia Procedure Notes (Addendum)
Central Venous Catheter Insertion Performed by: anesthesiologist 07/17/2016 7:06 AM Patient location: Pre-op. Preanesthetic checklist: patient identified, IV checked, site marked, risks and benefits discussed, surgical consent, monitors and equipment checked, pre-op evaluation, timeout performed and anesthesia consent Position: Trendelenburg Lidocaine 1% used for infiltration Landmarks identified and Seldinger technique used Catheter size: 9 Fr Central line and PA cath was placed.MAC introducer Swan type and PA catheter depth:thermodilution and 50PA Cath depth:50 Procedure performed using ultrasound guided technique. Attempts: 1 Following insertion, line sutured and dressing applied. Post procedure assessment: blood return through all ports, free fluid flow and no air. Patient tolerated the procedure well with no immediate complications.

## 2016-07-17 NOTE — Progress Notes (Signed)
Weaning initiated. Pt is stable, on arrival Art-line continuous BP monitoring is stable. Pt is following commands. No distress noted at this time.

## 2016-07-17 NOTE — Progress Notes (Addendum)
CI now 1.44, CO 2.32, at 1500 was CI was 2.0 and CO 3.2, paged MD to see if dopamine is needed or albumin, will monitor.  BP in the 140-150s, MAP in the low 100s, NTG started and titrated, PAP maintaining around 25/17 (19). UO adequate, 525 post op.  Temp now normal.

## 2016-07-17 NOTE — Procedures (Signed)
Extubation Procedure Note  Patient Details:   Name: Mercedes [name] DOB: 10-13-1955 MRN: [id number]   Airway Documentation:     Evaluation  O2 sats: stable throughout Complications: No apparent complications Patient did tolerate procedure well. Bilateral Breath Sounds: Clear   Yes  Pt passed pulmonary mechanics without complications, patient did not have an audible cuff leak following pulmonary mechanics. ABG results reveal acidosis, per MD wants two amps of Bicarb pushes and to proceed with extubation. Pt was able to speak post-extubation, stating her name. Pt was titrated to a 3L [place] O2 humidity provided. RN at bedside.   Benjamine Sprague, BS, RRT, RCP 07/17/2016, 9:49 PM

## 2016-07-17 NOTE — Progress Notes (Signed)
RRT notified readiness to wean. Pt awakens, f/c and is calm. BP stable.

## 2016-07-17 NOTE — Anesthesia Procedure Notes (Signed)
Anesthesia Procedure Image A-line picture

## 2016-07-18 ENCOUNTER — Encounter (HOSPITAL_COMMUNITY): Payer: Self-pay | Admitting: Cardiothoracic Surgery

## 2016-07-18 ENCOUNTER — Inpatient Hospital Stay (HOSPITAL_COMMUNITY): Payer: Medicare Other

## 2016-07-18 DIAGNOSIS — Z951 Presence of aortocoronary bypass graft: Secondary | ICD-10-CM

## 2016-07-18 LAB — CREATININE, SERUM
CREATININE: 1.3 mg/dL — AB (ref 0.44–1.00)
GFR calc Af Amer: 50 mL/min — ABNORMAL LOW (ref >60)
GFR calc non Af Amer: 43 mL/min — ABNORMAL LOW (ref >60)

## 2016-07-18 LAB — POCT I-STAT, CHEM 8
BUN: 14 mg/dL (ref 6–20)
CALCIUM ION: 1.18 mmol/L (ref 1.15–1.40)
CREATININE: 1.4 mg/dL — AB (ref 0.44–1.00)
Chloride: 98 mmol/L — ABNORMAL LOW (ref 101–111)
GLUCOSE: 121 mg/dL — AB (ref 65–99)
HCT: 31 % — ABNORMAL LOW (ref 36.0–46.0)
HEMOGLOBIN: 10.5 g/dL — AB (ref 12.0–15.0)
POTASSIUM: 4.5 mmol/L (ref 3.5–5.1)
Sodium: 134 mmol/L — ABNORMAL LOW (ref 135–145)
TCO2: 25 mmol/L (ref 0–100)

## 2016-07-18 LAB — BASIC METABOLIC PANEL
ANION GAP: 8 (ref 5–15)
BUN: 8 mg/dL (ref 6–20)
CO2: 23 mmol/L (ref 22–32)
Calcium: 8.1 mg/dL — ABNORMAL LOW (ref 8.9–10.3)
Chloride: 104 mmol/L (ref 101–111)
Creatinine, Ser: 0.79 mg/dL (ref 0.44–1.00)
GFR calc Af Amer: >60 mL/min (ref >60)
GLUCOSE: 119 mg/dL — AB (ref 65–99)
POTASSIUM: 4 mmol/L (ref 3.5–5.1)
Sodium: 135 mmol/L (ref 135–145)

## 2016-07-18 LAB — CBC
HEMATOCRIT: 30.3 % — AB (ref 36.0–46.0)
HEMATOCRIT: 30.8 % — AB (ref 36.0–46.0)
HEMOGLOBIN: 9.9 g/dL — AB (ref 12.0–15.0)
Hemoglobin: 9.8 g/dL — ABNORMAL LOW (ref 12.0–15.0)
MCH: 25.2 pg — AB (ref 26.0–34.0)
MCH: 25.1 pg — AB (ref 26.0–34.0)
MCHC: 32.3 g/dL (ref 30.0–36.0)
MCHC: 32.1 g/dL (ref 30.0–36.0)
MCV: 77.9 fL — AB (ref 78.0–100.0)
MCV: 78.2 fL (ref 78.0–100.0)
PLATELETS: 135 10*3/uL — AB (ref 150–400)
Platelets: 167 10*3/uL (ref 150–400)
RBC: 3.89 MIL/uL (ref 3.87–5.11)
RBC: 3.94 MIL/uL (ref 3.87–5.11)
RDW: 15.1 % (ref 11.5–15.5)
RDW: 15.2 % (ref 11.5–15.5)
WBC: 12 10*3/uL — AB (ref 4.0–10.5)
WBC: 15 10*3/uL — ABNORMAL HIGH (ref 4.0–10.5)

## 2016-07-18 LAB — GLUCOSE, CAPILLARY
GLUCOSE-CAPILLARY: 110 mg/dL — AB (ref 65–99)
GLUCOSE-CAPILLARY: 114 mg/dL — AB (ref 65–99)
GLUCOSE-CAPILLARY: 127 mg/dL — AB (ref 65–99)
GLUCOSE-CAPILLARY: 153 mg/dL — AB (ref 65–99)
Glucose-Capillary: 125 mg/dL — ABNORMAL HIGH (ref 65–99)
Glucose-Capillary: 128 mg/dL — ABNORMAL HIGH (ref 65–99)
Glucose-Capillary: 133 mg/dL — ABNORMAL HIGH (ref 65–99)

## 2016-07-18 LAB — HEMOGLOBIN A1C
Hgb A1c MFr Bld: 6 % — ABNORMAL HIGH (ref 4.8–5.6)
Mean Plasma Glucose: 126 mg/dL

## 2016-07-18 LAB — MAGNESIUM
Magnesium: 2.3 mg/dL (ref 1.7–2.4)
Magnesium: 2.3 mg/dL (ref 1.7–2.4)

## 2016-07-18 MED ORDER — FUROSEMIDE 10 MG/ML IJ SOLN
20.0000 mg | Freq: Once | INTRAMUSCULAR | Status: AC
  Administered 2016-07-18: 20 mg via INTRAVENOUS
  Filled 2016-07-18: qty 2

## 2016-07-18 MED ORDER — FUROSEMIDE 10 MG/ML IJ SOLN
20.0000 mg | Freq: Every day | INTRAMUSCULAR | Status: DC
  Filled 2016-07-18: qty 2

## 2016-07-18 MED ORDER — MORPHINE SULFATE (PF) 2 MG/ML IV SOLN
1.0000 mg | INTRAVENOUS | Status: AC | PRN
  Administered 2016-07-18: 2 mg via INTRAVENOUS
  Filled 2016-07-18: qty 1

## 2016-07-18 MED ORDER — INSULIN ASPART 100 UNIT/ML ~~LOC~~ SOLN
0.0000 [IU] | SUBCUTANEOUS | Status: DC
  Administered 2016-07-18 (×5): 2 [IU] via SUBCUTANEOUS

## 2016-07-18 MED FILL — Mannitol IV Soln 20%: INTRAVENOUS | Qty: 500 | Status: AC

## 2016-07-18 MED FILL — Heparin Sodium (Porcine) Inj 1000 Unit/ML: INTRAMUSCULAR | Qty: 10 | Status: AC

## 2016-07-18 MED FILL — Sodium Bicarbonate IV Soln 8.4%: INTRAVENOUS | Qty: 50 | Status: AC

## 2016-07-18 MED FILL — Electrolyte-R (PH 7.4) Solution: INTRAVENOUS | Qty: 4000 | Status: AC

## 2016-07-18 MED FILL — Lidocaine HCl IV Inj 20 MG/ML: INTRAVENOUS | Qty: 5 | Status: AC

## 2016-07-18 MED FILL — Dexmedetomidine HCl in NaCl 0.9% IV Soln 400 MCG/100ML: INTRAVENOUS | Qty: 100 | Status: AC

## 2016-07-18 MED FILL — Sodium Chloride IV Soln 0.9%: INTRAVENOUS | Qty: 2000 | Status: AC

## 2016-07-18 NOTE — Op Note (Signed)
NAMEORVELLA, DIGIULIO             ACCOUNT NO.:  [id number]  MEDICAL RECORD NO.:  [id number]  LOCATION:  [id number]                       FACILITY:  [facility]  PHYSICIAN:  Kerin Perna, M.D.  DATE OF BIRTH:  1956-02-15  DATE OF PROCEDURE:  07/17/2016 DATE OF DISCHARGE:                              OPERATIVE REPORT   OPERATION: 1. Coronary artery bypass grafting x3 (left internal mammary artery to     left anterior descending, saphenous vein graft to obtuse marginal,     saphenous vein graft to distal right coronary artery). 2. Endoscopic harvest of right leg greater saphenous vein.  PREOPERATIVE DIAGNOSES:  Class 4 unstable angina, non-ST elevation myocardial infarction, severe multivessel coronary artery disease.  SURGEON:  Kerin Perna, M.D.  ASSISTANT:  Jari Favre, PA-C.  ANESTHESIA:  General.  CLINICAL NOTE:  The patient is a 60 year old African American female, smoker, with history of peripheral vascular disease status post left fem- pop bypass and history of coronary artery disease status post stent to the ostium of the circumflex.  She returned with symptoms of unstable angina and positive cardiac enzymes.  Cardiac catheterization was performed by Dr. Tresa Endo, which demonstrated ostial LAD stenosis.  She was re-cath by Dr. Okey Dupre and found to have distal left main stenosis and was not a favorable candidate for PCI.  Surgical coronary revascularization was recommended.  I examined the patient in her hospital room and reviewed the results of the cardiac catheterizations with the patient and her family.  I discussed the indications and expected benefits of coronary artery bypass surgery for treatment of her coronary artery disease.  I reviewed the major aspects of the operation including the use of general anesthesia and cardiopulmonary bypass, the location of the surgical incisions, and the expected postoperative hospital recovery.  She understood that because of her heavy  smoking history, she was an increased risk for postop pulmonary problems including pneumonia, ventilator dependence, pulmonary complications including pleural effusion, and prolonged oxygen requirement.  The patient also understood the general risks of coronary artery bypass surgery to include the risk of stroke, MI, bleeding requiring transfusion, postoperative infection, and death.  After reviewing these issues, she demonstrated her understanding and agreed to proceed with surgery under what I felt was an informed consent.  OPERATIVE FINDINGS: 1. Small, but adequate conduit from the vein in the leg. 2. Deep atypical intramyocardial course of the LAD, anastomosis placed     at the distal aspect of the LAD. 3. No blood products required for the surgery.  OPERATIVE PROCEDURE:  The patient was brought to the operating room and placed supine on the operating table.  General anesthesia was induced under invasive hemodynamic monitoring.  The chest, abdomen, and legs were prepped with Betadine and draped as a sterile field.  A proper time- out was performed.  A sternal incision was made as the right saphenous vein was harvested in an endoscopic technique.  The left internal mammary artery was harvested as a pedicle graft from its origin at the subclavian vessels.  It was 1.5-mm vessel with good flow.  The sternal retractor was placed and the pericardium was opened.  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  The  ascending aorta was without calcification of normal size.  Pursestrings were placed in ascending aorta and right atrium and after the vein was harvested, heparin was administered.  When the ACT was documented as being therapeutic, the patient was cannulated and placed on cardiopulmonary bypass.  The coronaries were identified for grafting and the mammary artery and vein grafts were prepared for the distal anastomoses.  There was a tight ostial LAD  stenosis, a left main stenosis, and moderate 50% narrowings of the RCA in 2 different locations.  Cardioplegia cannulas were placed both antegrade aortic and retrograde coronary sinus cardioplegia.  The patient was cooled to 32 degrees and an aortic crossclamp was applied.  A liter of cold blood cardioplegia was delivered in split doses between the antegrade aortic and retrograde coronary sinus catheters.  There was good cardioplegic arrest and septal temperature dropped less than 12 degrees.  Cardioplegia was delivered every 20 minutes.  The distal coronary anastomoses were performed.  The first distal anastomosis was the distal RCA.  A reverse saphenous vein was sewn end- to-side with running 7-0 Prolene with good flow through the graft. Cardioplegia was redosed.  The second distal anastomosis was the OM branch of the left circumflex. This was 1.5-mm vessel proximal 80-90% stenosis.  A reverse saphenous vein was sewn end-to-side with running 7-0 Prolene with good flow through the graft.  Cardioplegia was redosed.  The third distal anastomosis was placed to the distal LAD.  It was above the apex.  It was 1.5-mm vessel.  The left IMA pedicle was brought through an opening in the left lateral pericardium was brought down onto the LAD and sewn end-to-side with running 8-0 Prolene.  There was good flow through the anastomosis after briefly releasing the pedicle bulldog on the mammary artery.  The bulldog was reapplied.  The pedicle was secured to the epicardium.  Cardioplegia was redosed.  While the crossclamp was still in place, 2 proximal vein anastomoses were performed on the ascending aorta using a 4.5 mm punch running 6-0 Prolene.  Prior to removing the crossclamp, air was vented from the coronaries with a dose of retrograde warm blood cardioplegia.  The crossclamp was removed.  The heart resumed a spontaneous rhythm.  The vein grafts were de-aired and opened.  Each had good  flow and hemostasis was documented at the proximal and distal anastomoses.  Temporary pacing wires were applied. The patient was rewarmed and reperfused.  The lungs were expanded and the ventilator was resumed.  The patient was weaned off cardiopulmonary bypass on low-dose milrinone. Echo showed normal LV function, preserved from preop.  Protamine was administered without adverse reaction.  The cannula was removed.  The patient remained stable.  The mediastinum was irrigated with warm saline.  The superior pericardial fat was closed over the aorta.  Anterior mediastinal and left pleural chest tube were placed and brought out through separate incisions.  The sternum was closed with wire.  The pectoralis fascia was closed with a running #1 Vicryl.  The subcutaneous and skin layers were closed using running Vicryl and sterile dressings were applied.  Total cardiopulmonary bypass time was 140 minutes.     Kerin Perna, M.D.     PV/MEDQ  D:  07/17/2016  T:  07/18/2016  Job:  [id number]  cc:   Nicki Gu[place], M.D.

## 2016-07-18 NOTE — Progress Notes (Signed)
Chest tubes and arterial line removed. Patient tolerated procedures well. No adverse events noted.

## 2016-07-18 NOTE — Care Management Important Message (Signed)
Important Message  Patient Details  Name: Mercedes [name] MRN: [id number] Date of Birth: December 14, 1955   Medicare Important Message Given:  Yes    Kyla Balzarine 07/18/2016, 9:36 AM

## 2016-07-18 NOTE — Progress Notes (Signed)
Pt c/o pain at R brachial arterial line site. Pt reports some numbness and tingling in R hand (denies tingling in L hand). Site unremarkable, line working fine. Good blood flow and pulses to R arm/hand. Information passed on to day RN to ask MD about removing line.  Peyton Bottoms, RN 7:15 AM 07/18/16

## 2016-07-18 NOTE — Progress Notes (Signed)
Subjective:  POD # 1 CABG X 3. Alert. Extubated. Hemodynamically stable on Milrinone. SGC out  Objective:  Temp:  [95.2 F (35.1 C)-98.8 F (37.1 C)] 98.7 F (37.1 C) (11/21 0855) Pulse Rate:  [73-88] 73 (11/21 0900) Resp:  [0-26] 21 (11/21 0900) BP: (88-134)/(58-97) 114/61 (11/21 0900) SpO2:  [94 %-100 %] 97 % (11/21 0914) Arterial Line BP: (83-151)/(45-91) 114/59 (11/21 0900) FiO2 (%):  [1 %-50 %] 1 % (11/21 0914) Weight:  [128 lb 4.9 oz (58.2 kg)] 128 lb 4.9 oz (58.2 kg) (11/21 0500) Weight change:   Intake/Output from previous day: 11/20 0701 - 11/21 0700 In: 3953.4 [P.O.:60; I.V.:2993.4; IV Piggyback:900] Out: 2600 [Urine:2120; Chest Tube:480]  Intake/Output from this shift: Total I/O In: 279.7 [I.V.:79.7; IV Piggyback:200] Out: 330 [Urine:260; Chest Tube:70]  Physical Exam: General appearance: alert and no distress Neck: no adenopathy, no carotid bruit, no JVD, supple, symmetrical, trachea midline and thyroid not enlarged, symmetric, no tenderness/mass/nodules Lungs: clear to auscultation bilaterally Heart: regular rate and rhythm, S1, S2 normal, no murmur, click, rub or gallop Extremities: extremities normal, atraumatic, no cyanosis or edema  Lab Results: Results for orders placed or performed during the hospital encounter of 07/11/16 (from the past 48 hour(s))  Prepare RBC (crossmatch)     Status: None   Collection Time: 07/16/16  1:15 PM  Result Value Ref Range   Order Confirmation ORDER PROCESSED BY BLOOD BANK   Type and screen [place]     Status: None (Preliminary result)   Collection Time: 07/16/16  1:22 PM  Result Value Ref Range   ABO/RH(D) B POS    Antibody Screen NEG    Sample Expiration 07/19/2016    Unit Number [id number]    Blood Component Type RED CELLS,LR    Unit division 00    Status of Unit ALLOCATED    Transfusion Status OK TO TRANSFUSE    Crossmatch Result Compatible    Unit Number [id number]    Blood  Component Type RED CELLS,LR    Unit division 00    Status of Unit ALLOCATED    Transfusion Status OK TO TRANSFUSE    Crossmatch Result Compatible   Hemoglobin A1c     Status: Abnormal   Collection Time: 07/16/16  2:35 PM  Result Value Ref Range   Hgb A1c MFr Bld 6.0 (H) 4.8 - 5.6 %    Comment: (NOTE)         Pre-diabetes: 5.7 - 6.4         Diabetes: >6.4         Glycemic control for adults with diabetes: <7.0    Mean Plasma Glucose 126 mg/dL    Comment: (NOTE) Performed At: [facility] [place], [place] [id number] Lindon Romp MD [id number]   Surgical pcr screen     Status: None   Collection Time: 07/16/16  2:52 PM  Result Value Ref Range   MRSA, PCR NEGATIVE NEGATIVE   Staphylococcus aureus NEGATIVE NEGATIVE    Comment:        The Xpert SA Assay (FDA approved for NASAL specimens in patients over 43 years of age), is one component of a comprehensive surveillance program.  Test performance has been validated by [facility] for patients greater than or equal to 17 year old. It is not intended to diagnose infection nor to guide or monitor treatment.   I-STAT 3, arterial blood gas (G3+)     Status: Abnormal  Collection Time: 07/16/16  3:57 PM  Result Value Ref Range   pH, Arterial 7.473 (H) 7.350 - 7.450   pCO2 arterial 28.3 (L) 32.0 - 48.0 mmHg   pO2, Arterial 73.0 (L) 83.0 - 108.0 mmHg   Bicarbonate 20.7 20.0 - 28.0 mmol/L   TCO2 22 0 - 100 mmol/L   O2 Saturation 96.0 %   Acid-base deficit 1.0 0.0 - 2.0 mmol/L   Patient temperature HIDE    Collection site BRACHIAL ARTERY    Drawn by Operator    Sample type ARTERIAL   CBC     Status: Abnormal   Collection Time: 07/17/16  4:25 AM  Result Value Ref Range   WBC 4.9 4.0 - 10.5 K/uL   RBC 5.28 (H) 3.87 - 5.11 MIL/uL   Hemoglobin 13.5 12.0 - 15.0 g/dL   HCT 40.5 36.0 - 46.0 %   MCV 76.7 (L) 78.0 - 100.0 fL   MCH 25.6 (L) 26.0 - 34.0 pg   MCHC 33.3 30.0 - 36.0 g/dL   RDW 15.0 11.5 -  15.5 %   Platelets 296 150 - 400 K/uL  Heparin level (unfractionated)     Status: Abnormal   Collection Time: 07/17/16  4:25 AM  Result Value Ref Range   Heparin Unfractionated 0.14 (L) 0.30 - 0.70 IU/mL    Comment:        IF HEPARIN RESULTS ARE BELOW EXPECTED VALUES, AND PATIENT DOSAGE HAS BEEN CONFIRMED, SUGGEST FOLLOW UP TESTING OF ANTITHROMBIN III LEVELS.   Basic metabolic panel     Status: Abnormal   Collection Time: 07/17/16  4:25 AM  Result Value Ref Range   Sodium 133 (L) 135 - 145 mmol/L   Potassium 4.4 3.5 - 5.1 mmol/L    Comment: HEMOLYSIS AT THIS LEVEL MAY AFFECT RESULT   Chloride 104 101 - 111 mmol/L   CO2 21 (L) 22 - 32 mmol/L   Glucose, Bld 105 (H) 65 - 99 mg/dL   BUN 13 6 - 20 mg/dL   Creatinine, Ser 0.86 0.44 - 1.00 mg/dL   Calcium 8.9 8.9 - 10.3 mg/dL   GFR calc non Af Amer >60 >60 mL/min   GFR calc Af Amer >60 >60 mL/min    Comment: (NOTE) The eGFR has been calculated using the CKD EPI equation. This calculation has not been validated in all clinical situations. eGFR's persistently <60 mL/min signify possible Chronic Kidney Disease.    Anion gap 8 5 - 15  Glucose, capillary     Status: None   Collection Time: 07/17/16  5:20 AM  Result Value Ref Range   Glucose-Capillary 95 65 - 99 mg/dL  I-STAT, chem 8     Status: None   Collection Time: 07/17/16  8:10 AM  Result Value Ref Range   Sodium 139 135 - 145 mmol/L   Potassium 3.9 3.5 - 5.1 mmol/L   Chloride 104 101 - 111 mmol/L   BUN 11 6 - 20 mg/dL   Creatinine, Ser 0.60 0.44 - 1.00 mg/dL   Glucose, Bld 86 65 - 99 mg/dL   Calcium, Ion 1.26 1.15 - 1.40 mmol/L   TCO2 22 0 - 100 mmol/L   Hemoglobin 12.6 12.0 - 15.0 g/dL   HCT 37.0 36.0 - 46.0 %  I-STAT, chem 8     Status: Abnormal   Collection Time: 07/17/16  9:48 AM  Result Value Ref Range   Sodium 136 135 - 145 mmol/L   Potassium 3.9 3.5 - 5.1 mmol/L  Chloride 101 101 - 111 mmol/L   BUN 11 6 - 20 mg/dL   Creatinine, Ser 0.80 0.44 - 1.00 mg/dL    Glucose, Bld 140 (H) 65 - 99 mg/dL   Calcium, Ion 1.29 1.15 - 1.40 mmol/L   TCO2 24 0 - 100 mmol/L   Hemoglobin 11.9 (L) 12.0 - 15.0 g/dL   HCT 35.0 (L) 36.0 - 46.0 %  I-STAT 3, arterial blood gas (G3+)     Status: Abnormal   Collection Time: 07/17/16 10:07 AM  Result Value Ref Range   pH, Arterial 7.349 (L) 7.350 - 7.450   pCO2 arterial 41.1 32.0 - 48.0 mmHg   pO2, Arterial 425.0 (H) 83.0 - 108.0 mmHg   Bicarbonate 22.6 20.0 - 28.0 mmol/L   TCO2 24 0 - 100 mmol/L   O2 Saturation 100.0 %   Acid-base deficit 3.0 (H) 0.0 - 2.0 mmol/L   Patient temperature HIDE    Sample type ARTERIAL   I-STAT, chem 8     Status: Abnormal   Collection Time: 07/17/16 10:10 AM  Result Value Ref Range   Sodium 137 135 - 145 mmol/L   Potassium 3.7 3.5 - 5.1 mmol/L   Chloride 99 (L) 101 - 111 mmol/L   BUN 9 6 - 20 mg/dL   Creatinine, Ser 0.50 0.44 - 1.00 mg/dL   Glucose, Bld 105 (H) 65 - 99 mg/dL   Calcium, Ion 0.96 (L) 1.15 - 1.40 mmol/L   TCO2 23 0 - 100 mmol/L   Hemoglobin 8.8 (L) 12.0 - 15.0 g/dL   HCT 26.0 (L) 36.0 - 46.0 %  Hemoglobin and hematocrit, blood     Status: Abnormal   Collection Time: 07/17/16 11:50 AM  Result Value Ref Range   Hemoglobin 8.0 (L) 12.0 - 15.0 g/dL    Comment: REPEATED TO VERIFY RESULT CALLED TO, READ BACK BY AND VERIFIED WITH: PENDER,J RN @ 1213 07/17/16 LEONARD,A    HCT 24.4 (L) 36.0 - 46.0 %    Comment: REPEATED TO VERIFY RESULT CALLED TO, READ BACK BY AND VERIFIED WITH: PENDER,J RN @ 1213 07/17/16 LEONARD,A   Platelet count     Status: None   Collection Time: 07/17/16 11:50 AM  Result Value Ref Range   Platelets 151 150 - 400 K/uL    Comment: REPEATED TO VERIFY RESULT CALLED TO, READ BACK BY AND VERIFIED WITH: PENDER,J RN @ 1213 07/17/16 LEONARD,A   I-STAT, chem 8     Status: Abnormal   Collection Time: 07/17/16 11:52 AM  Result Value Ref Range   Sodium 133 (L) 135 - 145 mmol/L   Potassium 4.7 3.5 - 5.1 mmol/L   Chloride 96 (L) 101 - 111 mmol/L    BUN 10 6 - 20 mg/dL   Creatinine, Ser 0.80 0.44 - 1.00 mg/dL   Glucose, Bld 105 (H) 65 - 99 mg/dL   Calcium, Ion 1.05 (L) 1.15 - 1.40 mmol/L   TCO2 25 0 - 100 mmol/L   Hemoglobin 8.2 (L) 12.0 - 15.0 g/dL   HCT 24.0 (L) 36.0 - 46.0 %  I-STAT 3, arterial blood gas (G3+)     Status: Abnormal   Collection Time: 07/17/16 12:42 PM  Result Value Ref Range   pH, Arterial 7.339 (L) 7.350 - 7.450   pCO2 arterial 42.5 32.0 - 48.0 mmHg   pO2, Arterial 311.0 (H) 83.0 - 108.0 mmHg   Bicarbonate 22.9 20.0 - 28.0 mmol/L   TCO2 24 0 - 100 mmol/L   O2 Saturation  100.0 %   Acid-base deficit 3.0 (H) 0.0 - 2.0 mmol/L   Patient temperature HIDE    Sample type ARTERIAL   I-STAT, chem 8     Status: Abnormal   Collection Time: 07/17/16 12:45 PM  Result Value Ref Range   Sodium 134 (L) 135 - 145 mmol/L   Potassium 3.9 3.5 - 5.1 mmol/L   Chloride 98 (L) 101 - 111 mmol/L   BUN 10 6 - 20 mg/dL   Creatinine, Ser 0.70 0.44 - 1.00 mg/dL   Glucose, Bld 134 (H) 65 - 99 mg/dL   Calcium, Ion 1.04 (L) 1.15 - 1.40 mmol/L   TCO2 22 0 - 100 mmol/L   Hemoglobin 8.8 (L) 12.0 - 15.0 g/dL   HCT 26.0 (L) 36.0 - 46.0 %  CBC     Status: Abnormal   Collection Time: 07/17/16  1:45 PM  Result Value Ref Range   WBC 12.0 (H) 4.0 - 10.5 K/uL   RBC 3.89 3.87 - 5.11 MIL/uL   Hemoglobin 9.9 (L) 12.0 - 15.0 g/dL   HCT 30.1 (L) 36.0 - 46.0 %   MCV 77.4 (L) 78.0 - 100.0 fL   MCH 25.4 (L) 26.0 - 34.0 pg   MCHC 32.9 30.0 - 36.0 g/dL   RDW 15.1 11.5 - 15.5 %   Platelets 135 (L) 150 - 400 K/uL  Protime-INR     Status: Abnormal   Collection Time: 07/17/16  1:45 PM  Result Value Ref Range   Prothrombin Time 18.7 (H) 11.4 - 15.2 seconds   INR 1.54   APTT     Status: None   Collection Time: 07/17/16  1:45 PM  Result Value Ref Range   aPTT 33 24 - 36 seconds  I-STAT 4, (NA,K, GLUC, HGB,HCT)     Status: Abnormal   Collection Time: 07/17/16  1:53 PM  Result Value Ref Range   Sodium 137 135 - 145 mmol/L   Potassium 3.9 3.5 - 5.1  mmol/L   Glucose, Bld 113 (H) 65 - 99 mg/dL   HCT 33.0 (L) 36.0 - 46.0 %   Hemoglobin 11.2 (L) 12.0 - 15.0 g/dL  I-STAT 3, arterial blood gas (G3+)     Status: Abnormal   Collection Time: 07/17/16  2:56 PM  Result Value Ref Range   pH, Arterial 7.367 7.350 - 7.450   pCO2 arterial 39.4 32.0 - 48.0 mmHg   pO2, Arterial 91.0 83.0 - 108.0 mmHg   Bicarbonate 23.1 20.0 - 28.0 mmol/L   TCO2 24 0 - 100 mmol/L   O2 Saturation 97.0 %   Acid-base deficit 3.0 (H) 0.0 - 2.0 mmol/L   Patient temperature 35.2 C    Sample type ARTERIAL   Glucose, capillary     Status: Abnormal   Collection Time: 07/17/16  2:58 PM  Result Value Ref Range   Glucose-Capillary 106 (H) 65 - 99 mg/dL  Glucose, capillary     Status: Abnormal   Collection Time: 07/17/16  4:06 PM  Result Value Ref Range   Glucose-Capillary 102 (H) 65 - 99 mg/dL  Glucose, capillary     Status: Abnormal   Collection Time: 07/17/16  5:09 PM  Result Value Ref Range   Glucose-Capillary 120 (H) 65 - 99 mg/dL   Comment 1 Arterial Specimen   Glucose, capillary     Status: Abnormal   Collection Time: 07/17/16  6:03 PM  Result Value Ref Range   Glucose-Capillary 128 (H) 65 - 99 mg/dL  Glucose, capillary  Status: Abnormal   Collection Time: 07/17/16  6:57 PM  Result Value Ref Range   Glucose-Capillary 106 (H) 65 - 99 mg/dL  CBC     Status: Abnormal   Collection Time: 07/17/16  8:00 PM  Result Value Ref Range   WBC 11.2 (H) 4.0 - 10.5 K/uL   RBC 3.97 3.87 - 5.11 MIL/uL   Hemoglobin 10.2 (L) 12.0 - 15.0 g/dL   HCT 30.8 (L) 36.0 - 46.0 %   MCV 77.6 (L) 78.0 - 100.0 fL   MCH 25.7 (L) 26.0 - 34.0 pg   MCHC 33.1 30.0 - 36.0 g/dL   RDW 15.1 11.5 - 15.5 %   Platelets 140 (L) 150 - 400 K/uL  Magnesium     Status: Abnormal   Collection Time: 07/17/16  8:00 PM  Result Value Ref Range   Magnesium 3.0 (H) 1.7 - 2.4 mg/dL  Creatinine, serum     Status: None   Collection Time: 07/17/16  8:00 PM  Result Value Ref Range   Creatinine, Ser 0.84  0.44 - 1.00 mg/dL   GFR calc non Af Amer >60 >60 mL/min   GFR calc Af Amer >60 >60 mL/min    Comment: (NOTE) The eGFR has been calculated using the CKD EPI equation. This calculation has not been validated in all clinical situations. eGFR's persistently <60 mL/min signify possible Chronic Kidney Disease.   I-STAT, chem 8     Status: Abnormal   Collection Time: 07/17/16  8:12 PM  Result Value Ref Range   Sodium 136 135 - 145 mmol/L   Potassium 4.7 3.5 - 5.1 mmol/L   Chloride 103 101 - 111 mmol/L   BUN 9 6 - 20 mg/dL   Creatinine, Ser 0.80 0.44 - 1.00 mg/dL   Glucose, Bld 117 (H) 65 - 99 mg/dL   Calcium, Ion 1.18 1.15 - 1.40 mmol/L   TCO2 24 0 - 100 mmol/L   Hemoglobin 11.2 (L) 12.0 - 15.0 g/dL   HCT 33.0 (L) 36.0 - 46.0 %  Glucose, capillary     Status: Abnormal   Collection Time: 07/17/16  9:02 PM  Result Value Ref Range   Glucose-Capillary 118 (H) 65 - 99 mg/dL   Comment 1 Arterial Specimen   I-STAT 3, arterial blood gas (G3+)     Status: Abnormal   Collection Time: 07/17/16  9:23 PM  Result Value Ref Range   pH, Arterial 7.288 (L) 7.350 - 7.450   pCO2 arterial 40.7 32.0 - 48.0 mmHg   pO2, Arterial 77.0 (L) 83.0 - 108.0 mmHg   Bicarbonate 19.6 (L) 20.0 - 28.0 mmol/L   TCO2 21 0 - 100 mmol/L   O2 Saturation 94.0 %   Acid-base deficit 7.0 (H) 0.0 - 2.0 mmol/L   Patient temperature 36.4 C    Collection site ARTERIAL LINE    Drawn by Nurse    Sample type ARTERIAL   Glucose, capillary     Status: Abnormal   Collection Time: 07/17/16  9:56 PM  Result Value Ref Range   Glucose-Capillary 165 (H) 65 - 99 mg/dL   Comment 1 Arterial Specimen   Glucose, capillary     Status: Abnormal   Collection Time: 07/17/16 10:46 PM  Result Value Ref Range   Glucose-Capillary 140 (H) 65 - 99 mg/dL   Comment 1 Arterial Specimen   I-STAT 3, arterial blood gas (G3+)     Status: Abnormal   Collection Time: 07/17/16 10:47 PM  Result Value Ref Range  pH, Arterial 7.384 7.350 - 7.450   pCO2  arterial 40.1 32.0 - 48.0 mmHg   pO2, Arterial 62.0 (L) 83.0 - 108.0 mmHg   Bicarbonate 24.1 20.0 - 28.0 mmol/L   TCO2 25 0 - 100 mmol/L   O2 Saturation 92.0 %   Acid-base deficit 1.0 0.0 - 2.0 mmol/L   Patient temperature 36.2 C    Collection site ARTERIAL LINE    Drawn by Nurse    Sample type ARTERIAL   Glucose, capillary     Status: None   Collection Time: 07/18/16 12:00 AM  Result Value Ref Range   Glucose-Capillary 99 65 - 99 mg/dL   Comment 1 Arterial Specimen   Glucose, capillary     Status: Abnormal   Collection Time: 07/18/16 12:53 AM  Result Value Ref Range   Glucose-Capillary 114 (H) 65 - 99 mg/dL   Comment 1 Arterial Specimen   Glucose, capillary     Status: Abnormal   Collection Time: 07/18/16  2:05 AM  Result Value Ref Range   Glucose-Capillary 110 (H) 65 - 99 mg/dL   Comment 1 Arterial Specimen   CBC     Status: Abnormal   Collection Time: 07/18/16  4:00 AM  Result Value Ref Range   WBC 12.0 (H) 4.0 - 10.5 K/uL   RBC 3.89 3.87 - 5.11 MIL/uL   Hemoglobin 9.8 (L) 12.0 - 15.0 g/dL   HCT 30.3 (L) 36.0 - 46.0 %   MCV 77.9 (L) 78.0 - 100.0 fL   MCH 25.2 (L) 26.0 - 34.0 pg   MCHC 32.3 30.0 - 36.0 g/dL   RDW 15.1 11.5 - 15.5 %   Platelets 135 (L) 150 - 400 K/uL  Basic metabolic panel     Status: Abnormal   Collection Time: 07/18/16  4:00 AM  Result Value Ref Range   Sodium 135 135 - 145 mmol/L   Potassium 4.0 3.5 - 5.1 mmol/L   Chloride 104 101 - 111 mmol/L   CO2 23 22 - 32 mmol/L   Glucose, Bld 119 (H) 65 - 99 mg/dL   BUN 8 6 - 20 mg/dL   Creatinine, Ser 0.79 0.44 - 1.00 mg/dL   Calcium 8.1 (L) 8.9 - 10.3 mg/dL   GFR calc non Af Amer >60 >60 mL/min   GFR calc Af Amer >60 >60 mL/min    Comment: (NOTE) The eGFR has been calculated using the CKD EPI equation. This calculation has not been validated in all clinical situations. eGFR's persistently <60 mL/min signify possible Chronic Kidney Disease.    Anion gap 8 5 - 15  Magnesium     Status: None    Collection Time: 07/18/16  4:00 AM  Result Value Ref Range   Magnesium 2.3 1.7 - 2.4 mg/dL  Glucose, capillary     Status: Abnormal   Collection Time: 07/18/16  4:09 AM  Result Value Ref Range   Glucose-Capillary 127 (H) 65 - 99 mg/dL   Comment 1 Arterial Specimen     Imaging: Imaging results have been reviewed  Tele- NSR on 70s  Assessment/Plan:   1. Principal Problem: 2.   NSTEMI (non-ST elevated myocardial infarction) ([place]) 3. Active Problems: 4.   Dyslipidemia 5.   Tachycardia-bradycardia ([place]) 6.   PVD- s/p [facility] April 2015 7.   Rheumatoid arthritis ([place]) 8.   SLE (systemic lupus erythematosus) ([facility]) 9.   CAD - S/P Tak[facility]ubo MI in 2000, CFX DES Sept 2014 10.   Chest pain 11.  Paroxysmal atrial fibrillation ([facility]) 12.   S/P CABG x 3 13.   Time Spent Directly with Patient:  20 minutes  Length of Stay:  LOS: 7 days   POD # 1 CABG X 3 for LM Dz and nl LV fxn. Only mildly elevated Trop on presentation for NSTEMI. On low dose Milrinone. VSS. SGC out (C.O was 2.3). Exam benign. NSR. CT coming out this AM. OOB to chair. Nl progression per TCTS. Will see again out in circle.   Quay Burow 07/18/2016, 9:47 AM

## 2016-07-18 NOTE — Progress Notes (Signed)
EKG CRITICAL VALUE     12 lead EKG performed.  Critical value noted.  Kennith Center, RN notified.   Areonna Bran, CCT 07/18/2016 8:01 AM

## 2016-07-18 NOTE — Progress Notes (Signed)
      [address]       [place] [address]             [phone]      Evening rounds  Just back from walking  BP 95/66   Pulse 68   Temp 99 F (37.2 C) (Oral)   Resp 20   Ht 5\' 6"  (1.676 m)   Wt 128 lb 4.9 oz (58.2 kg)   SpO2 97%   BMI 20.71 kg/m    Intake/Output Summary (Last 24 hours) at 07/18/16 1803 Last data filed at 07/18/16 1700  Gross per 24 hour  Intake          1816.63 ml  Output             1545 ml  Net           271.63 ml   On milrinone 0.25 mcg/kg/min CBG well controlled PM labs pending  07/20/16 C. Viviann Spare, MD [facility] [phone]

## 2016-07-18 NOTE — Progress Notes (Signed)
1 Day Post-Op Procedure(s) (LRB): CORONARY ARTERY BYPASS GRAFTING (CABG)x3 using right greater saphenous vein harvested endoscopiclly and left internal mammary artery (N/A) TRANSESOPHAGEAL ECHOCARDIOGRAM (TEE) (N/A) Subjective: Awake , comfortable Min chest tube drainage NSR no ST changes Objective: Vital signs in last 24 hours: Temp:  [95.2 F (35.1 C)-98.8 F (37.1 C)] 98.6 F (37 C) (11/21 0800) Pulse Rate:  [76-88] 78 (11/21 0800) Cardiac Rhythm: Normal sinus rhythm (11/21 0700) Resp:  [0-26] 19 (11/21 0800) BP: (88-134)/(58-97) 106/65 (11/21 0800) SpO2:  [94 %-100 %] 97 % (11/21 0800) Arterial Line BP: (83-151)/(45-91) 117/61 (11/21 0800) FiO2 (%):  [40 %-50 %] 40 % (11/20 2103) Weight:  [128 lb 4.9 oz (58.2 kg)] 128 lb 4.9 oz (58.2 kg) (11/21 0500)  Hemodynamic parameters for last 24 hours: PAP: (21-41)/(6-24) 37/21 CO:  [2.3 L/min-4.9 L/min] 4.8 L/min CI:  [1.4 L/min/m2-3.1 L/min/m2] 3 L/min/m2  Intake/Output from previous day: 11/20 0701 - 11/21 0700 In: 3953.4 [P.O.:60; I.V.:2993.4; IV Piggyback:900] Out: 2600 [Urine:2120; Chest Tube:480] Intake/Output this shift: Total I/O In: -  Out: 70 [Urine:30; Chest Tube:40]       Exam    General- alert and comfortable   Lungs- clear without rales, wheezes   Cor- regular rate and rhythm, no murmur , gallop   Abdomen- soft, non-tender   Extremities - warm, non-tender, minimal edema   Neuro- oriented, appropriate, no focal weakness   Lab Results:  Recent Labs  07/17/16 2000 07/17/16 2012 07/18/16 0400  WBC 11.2*  --  12.0*  HGB 10.2* 11.2* 9.8*  HCT 30.8* 33.0* 30.3*  PLT 140*  --  135*   BMET:  Recent Labs  07/17/16 0425  07/17/16 2012 07/18/16 0400  NA 133*  < > 136 135  K 4.4  < > 4.7 4.0  CL 104  < > 103 104  CO2 21*  --   --  23  GLUCOSE 105*  < > 117* 119*  BUN 13  < > 9 8  CREATININE 0.86  < > 0.80 0.79  CALCIUM 8.9  --   --  8.1*  < > = values in this interval not displayed.  PT/INR:   Recent Labs  07/17/16 1345  LABPROT 18.7*  INR 1.54   ABG    Component Value Date/Time   PHART 7.384 07/17/2016 2247   HCO3 24.1 07/17/2016 2247   TCO2 25 07/17/2016 2247   ACIDBASEDEF 1.0 07/17/2016 2247   O2SAT 92.0 07/17/2016 2247   CBG (last 3)   Recent Labs  07/18/16 0053 07/18/16 0205 07/18/16 0409  GLUCAP 114* 110* 127*    Assessment/Plan: S/P Procedure(s) (LRB): CORONARY ARTERY BYPASS GRAFTING (CABG)x3 using right greater saphenous vein harvested endoscopiclly and left internal mammary artery (N/A) TRANSESOPHAGEAL ECHOCARDIOGRAM (TEE) (N/A) Mobilize Diuresis d/c tubes/lines See progression orders   LOS: 7 days    Kathlee Nations Trigt III 07/18/2016

## 2016-07-19 ENCOUNTER — Inpatient Hospital Stay (HOSPITAL_COMMUNITY): Payer: Medicare Other

## 2016-07-19 ENCOUNTER — Encounter (HOSPITAL_COMMUNITY): Payer: Self-pay

## 2016-07-19 LAB — GLUCOSE, CAPILLARY
GLUCOSE-CAPILLARY: 111 mg/dL — AB (ref 65–99)
GLUCOSE-CAPILLARY: 136 mg/dL — AB (ref 65–99)
Glucose-Capillary: 118 mg/dL — ABNORMAL HIGH (ref 65–99)
Glucose-Capillary: 119 mg/dL — ABNORMAL HIGH (ref 65–99)
Glucose-Capillary: 122 mg/dL — ABNORMAL HIGH (ref 65–99)

## 2016-07-19 LAB — BASIC METABOLIC PANEL
Anion gap: 7 (ref 5–15)
BUN: 19 mg/dL (ref 6–20)
CO2: 25 mmol/L (ref 22–32)
Calcium: 8.3 mg/dL — ABNORMAL LOW (ref 8.9–10.3)
Chloride: 99 mmol/L — ABNORMAL LOW (ref 101–111)
Creatinine, Ser: 1.38 mg/dL — ABNORMAL HIGH (ref 0.44–1.00)
GFR calc Af Amer: 46 mL/min — ABNORMAL LOW (ref >60)
GFR calc non Af Amer: 40 mL/min — ABNORMAL LOW (ref >60)
Glucose, Bld: 116 mg/dL — ABNORMAL HIGH (ref 65–99)
Potassium: 4.5 mmol/L (ref 3.5–5.1)
Sodium: 131 mmol/L — ABNORMAL LOW (ref 135–145)

## 2016-07-19 LAB — CBC
HCT: 29.4 % — ABNORMAL LOW (ref 36.0–46.0)
Hemoglobin: 9.4 g/dL — ABNORMAL LOW (ref 12.0–15.0)
MCH: 24.9 pg — ABNORMAL LOW (ref 26.0–34.0)
MCHC: 32 g/dL (ref 30.0–36.0)
MCV: 78 fL (ref 78.0–100.0)
Platelets: 153 10*3/uL (ref 150–400)
RBC: 3.77 MIL/uL — ABNORMAL LOW (ref 3.87–5.11)
RDW: 15.3 % (ref 11.5–15.5)
WBC: 15.2 10*3/uL — ABNORMAL HIGH (ref 4.0–10.5)

## 2016-07-19 MED ORDER — ENOXAPARIN SODIUM 30 MG/0.3ML ~~LOC~~ SOLN
30.0000 mg | SUBCUTANEOUS | Status: DC
  Administered 2016-07-19 – 2016-07-23 (×5): 30 mg via SUBCUTANEOUS
  Filled 2016-07-19 (×5): qty 0.3

## 2016-07-19 MED ORDER — OXYCODONE HCL 5 MG PO TABS
5.0000 mg | ORAL_TABLET | ORAL | Status: DC | PRN
  Administered 2016-07-19 – 2016-07-20 (×2): 5 mg via ORAL
  Filled 2016-07-19 (×3): qty 1

## 2016-07-19 MED ORDER — AMIODARONE HCL 200 MG PO TABS
200.0000 mg | ORAL_TABLET | Freq: Every day | ORAL | Status: DC
  Administered 2016-07-19 – 2016-07-24 (×6): 200 mg via ORAL
  Filled 2016-07-19 (×6): qty 1

## 2016-07-19 MED ORDER — SODIUM CHLORIDE 0.9% FLUSH: 3.0000 mL | INTRAVENOUS | Status: DC | PRN

## 2016-07-19 MED ORDER — FUROSEMIDE 10 MG/ML IJ SOLN
40.0000 mg | Freq: Every day | INTRAMUSCULAR | Status: DC
  Administered 2016-07-20: 40 mg via INTRAVENOUS
  Filled 2016-07-19: qty 4

## 2016-07-19 MED ORDER — FUROSEMIDE 10 MG/ML IJ SOLN
40.0000 mg | Freq: Once | INTRAMUSCULAR | Status: AC
  Administered 2016-07-19: 40 mg via INTRAVENOUS
  Filled 2016-07-19: qty 4

## 2016-07-19 MED ORDER — MOVING RIGHT ALONG BOOK
Freq: Once | Status: AC
  Administered 2016-07-19: 10:00:00
  Filled 2016-07-19: qty 1

## 2016-07-19 MED ORDER — SODIUM CHLORIDE 0.9 % IV SOLN
250.0000 mL | INTRAVENOUS | Status: DC | PRN
  Administered 2016-07-19: 250 mL via INTRAVENOUS

## 2016-07-19 MED ORDER — SODIUM CHLORIDE 0.9% FLUSH
3.0000 mL | Freq: Two times a day (BID) | INTRAVENOUS | Status: DC
  Administered 2016-07-19: 3 mL via INTRAVENOUS

## 2016-07-19 NOTE — Progress Notes (Signed)
Pt refusing to do IS or get out of bed to chair.  Discussed the risks and benefits involved with pulmonary exercise and getting OOB and eating.  Patient family at bedside.  Pt not very responsive to the discussion.  Given pain medicine at this time.  VSS will continue to monitor.

## 2016-07-19 NOTE — Progress Notes (Signed)
CT surgery p.m. Rounds  Patient progressing slowly after CABG 3 Patient felt too weak to walk today but sat in chair Patient feels stronger this p.m. after receiving adequate meal service to her liking and is hopeful to be able to walk tomorrow Weaning milrinone slowly for preop non  Q wave MI

## 2016-07-19 NOTE — Discharge Summary (Signed)
Physician Discharge Summary       [address] [place]       [place] [address]             [phone]    Patient ID: Mercedes [name] MRN: [id number] DOB/AGE: 1956/08/03 60 y.o.  Admit date: 07/11/2016 Discharge date: 07/24/2016  Admission Diagnoses: 1. NSTEMI (non-ST elevated myocardial infarction) (HCC) 2. CAD - S/P Takotsubo MI in 2000, CFX DES Sept 2014  Active Diagnoses:  1. Dyslipidemia 2. Tachycardia-bradycardia (HCC) 3. PVD- s/p Lt [facility] April 2015 4. Rheumatoid arthritis (HCC) 5. SLE (systemic lupus erythematosus) (HCC) 6. Paroxysmal atrial fibrillation (HCC) 7. Tobacco abuse 8. Sjogren's syndrome (HCC) 9. Takotsubo cardiomyopathyanemia 10. HYPERLIPIDEMIA 11. HYPERTENSION 12. Arnold-Chiari malformation, type I (HCC) 13. GERD (gastroesophageal reflux disease) 14. DEPRESSION 15. ABL anemia    Procedure (s):  Left Heart Cath and Coronary Angiography by Dr. Tresa Endo on 07/12/2016:  Conclusion     The left ventricular ejection fraction is 50-55% by visual estimate.  There is mild left ventricular systolic dysfunction.  1st Diag lesion, 30 %stenosed.  Ost Cx to Prox Cx lesion, 0 %stenosed.   Low normal global LV function with an ejection fraction of 50-55% but with focal hypocontractility involving the upper mid to basal inferior wall and distal inferior apical segment.  There is a widely patent stent at the ostium of the left circumflex coronary artery, which seems to extend slightly into the distal left main.  There appears to be 2 areas of focal thrombus; one appears to be at the distal left main outside the stent strut which extends into the left main and at the ostium of the LAD outside the stent strut which appears to slightly jail this segment of LAD.  Mild 30% diagonal stenosis of the LAD.  Normal RCA.     1. Coronary artery bypass grafting x3 (left internal mammary artery to     left anterior descending, saphenous vein graft to obtuse  marginal,     saphenous vein graft to distal right coronary artery). 2. Endoscopic harvest of right leg greater saphenous vein by Dr. Donata Clay on 07/17/2016.  History of Presenting Illness: Dr. Donata Clay was asked to evaluate this 60 year old AA female, nondiabetic, smoker for surgical coronary revascularization after recent diagnosis of significant distal left main stenosis and ostial 90-95 percent LAD stenosis with symptoms of unstable angina. Cardiac enzymes were mildly elevated consistent with non-ST elevation MI. The patient was admitted to the emergency department with resting chest pain and history of previous PCI to the circumflex approximately 3 years ago. The patient also had left femoropopliteal bypass with saphenous vein 3 years ago. The patient has had coronary angiograms twice during this admission via right radial artery and her cardiologist have concluded that surgical coronary revascularization would be her best long-term therapy. LV function is preserved. LVEDP is normal.  The patient has been on long-term anticoagulation with eliquis and is in atrial flutter currently. No bleeding complications from her  anticoagulation with Eliquis. The Eliquis has been on hold since her admission.  The patient's left femoropopliteal bypass is open with ABI of 0.9. Right leg has claudication with ABI of 0.6. She appears to have a good right saphenous vein.   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  Dr. Donata Clay discussed the need for coronary artery bypass grafting surgery. Potential risks, benefits, and complications were discussed with the patient and she agreed to proceed with surgery. Pre  operative carotid duplex US showed no significant right internal carotid artery stenosis and a 40-59% left internal carotid artery stenosis. She underwent a CABG x 3 on 07/17/2016.  Brief Hospital Course:  The patient was extubated later the evening of surgery  without difficulty. She remained afebrile and hemodynamically stable. Theone Murdoch, a line, chest tubes, and foley were removed early in the post operative course. Lopressor was started and titrated accordingly. She was volume over loaded and diuresed. She had ABL anemia. She did not require a post op transfusion. Her last H and H was 9.4 and 28.9. She was weaned off the insulin drip. The patient's HGA1C pre op was 6. She is likely pre diabetic. The patient was felt surgically stable for transfer from the ICU to PCTU for further convalescence on 07/19/2016. She continues to progress with cardiac rehab. She was on 2 liters of oxygen via Cutter. We will try to wean her to room air, but she may need oxygen at discharge. She has been tolerating a diet and has had a bowel movement. Epicardial pacing wires were removed on 07/21/2016.  She lives alone. PT consult was obtained and they recommended short term facility.  SNF placement has been arranged. She will be discharged on 2L [place] and weaned over the next few days as tolerated at the SNF.  Chest tube sutures will be removed the day of discharge. The patient is felt surgically stable for discharge today.  We ask the SNF to please do the following: 1. Please obtain vital signs at least one time daily 2.Please weigh the patient daily. If he or she continues to gain weight or develops lower extremity edema, contact the office at [phone]. 3. Ambulate patient at least three times daily and please use sternal precautions.  Latest Vital Signs: Blood pressure 104/66, pulse 75, temperature 98.1 F (36.7 C), temperature source Oral, resp. rate 18, height 5\' 6"  (1.676 m), weight 119 lb 6.4 oz (54.2 kg), SpO2 94 %.  Physical Exam: General- alert and comfortable   Lungs- clear without rales, wheezes   Cor- regular rate and rhythm, no murmur , gallop   Abdomen- soft, non-tender   Extremities - warm, non-tender, minimal edema   Neuro- oriented, appropriate, no focal  weakness  Discharge Condition:Stable and discharged to SNF.  Recent laboratory studies:  Lab Results  Component Value Date   WBC 9.1 07/22/2016   HGB 9.4 (L) 07/22/2016   HCT 28.9 (L) 07/22/2016   MCV 77.7 (L) 07/22/2016   PLT 337 07/22/2016   Lab Results  Component Value Date   NA 135 07/21/2016   K 4.3 07/21/2016   CL 102 07/21/2016   CO2 22 07/21/2016   CREATININE 0.98 07/21/2016   GLUCOSE 96 07/21/2016    Diagnostic Studies:   EXAM: CHEST  2 VIEW  COMPARISON:  Prior chest x-ray 07/20/2016  FINDINGS: Interval removal of right IJ Cordis sheath. Bilateral layering pleural effusions demonstrate no significant interval change. There is associated bibasilar atelectasis. No overt pulmonary edema. Stable cardiomegaly and mediastinal contours. Patient is status post median sternotomy with evidence of multivessel CABG. A leadless pacemaker overlies the left heart.  IMPRESSION: 1. Interval removal of right IJ Cordis sheath. 2. Persistent bilateral moderate layering pleural effusions with associated bibasilar atelectasis. 3. Unchanged cardiomegaly and postsurgical changes including median sternotomy for multivessel CABG and permanent leadless pacemaker.   Electronically Signed   By: 07/22/2016 M.D.   On: 07/22/2016 08:40  Discharge Instructions    Amb  Referral to Cardiac Rehabilitation    Complete by:  As directed    Diagnosis:   CABG NSTEMI     CABG X ___:  3     Discharge Medications:   Medication List    STOP taking these medications   gabapentin 100 MG capsule Commonly known as:  NEURONTIN   metoprolol succinate 25 MG 24 hr tablet Commonly known as:  TOPROL-XL   nitroGLYCERIN 0.4 MG SL tablet Commonly known as:  NITROSTAT     TAKE these medications   acetaminophen 325 MG tablet Commonly known as:  TYLENOL Take 2 tablets (650 mg total) by mouth every 6 (six) hours as needed for mild pain.   amiodarone 200 MG tablet Commonly known  as:  PACERONE Take 1 tablet (200 mg total) by mouth daily.   apixaban 5 MG Tabs tablet Commonly known as:  ELIQUIS Take 1 tablet (5 mg total) by mouth 2 (two) times daily. What changed:  when to take this   aspirin EC 81 MG tablet Take 1 tablet (81 mg total) by mouth daily.   bisacodyl 5 MG EC tablet Commonly known as:  DULCOLAX Take 2 tablets (10 mg total) by mouth daily as needed for moderate constipation (Give daily if no BM).   docusate sodium 100 MG capsule Commonly known as:  COLACE Take 2 capsules (200 mg total) by mouth daily.   ENSURE Take 237 mLs by mouth daily.   furosemide 40 MG tablet Commonly known as:  LASIX Take 1 tablet (40 mg total) by mouth daily.   levalbuterol 0.63 MG/3ML nebulizer solution Commonly known as:  XOPENEX Take 3 mLs (0.63 mg total) by nebulization every 6 (six) hours as needed for wheezing or shortness of breath.   lisinopril 5 MG tablet Commonly known as:  PRINIVIL,ZESTRIL Take 1 tablet (5 mg total) by mouth daily. What changed:  medication strength  how much to take   metoprolol tartrate 25 MG tablet Commonly known as:  LOPRESSOR Take 0.5 tablets (12.5 mg total) by mouth 2 (two) times daily.   mometasone-formoterol 100-5 MCG/ACT Aero Commonly known as:  DULERA Inhale 2 puffs into the lungs 2 (two) times daily.   oxyCODONE 5 MG immediate release tablet Commonly known as:  Oxy IR/ROXICODONE Take 1 tablet (5 mg total) by mouth every 4 (four) hours as needed for severe pain.   potassium chloride SA 20 MEQ tablet Commonly known as:  K-DUR,KLOR-CON Take 1 tablet (20 mEq total) by mouth daily.   rosuvastatin 40 MG tablet Commonly known as:  CRESTOR Take 1 tablet (40 mg total) by mouth daily.            Durable Medical Equipment        Start     Ordered   07/24/16 1235  For home use only DME oxygen  Once    Question Answer Comment  Mode or (Route) Nasal cannula   Liters per Minute 2   Frequency Continuous (stationary  and portable oxygen unit needed)   Oxygen delivery system Gas      07/24/16 1234     The patient has been discharged on:   1.Beta Blocker:  Yes [  x ]                              No   [   ]  If No, reason:  2.Ace Inhibitor/ARB: Yes [ x  ]                                     No  [    ]                                     If No, reason:  3.Statin:   Yes [ x  ]                  No  [   ]                  If No, reason:  4.Ecasa:  Yes  [ x  ]                  No   [   ]                  If No, reason: Follow Up Appointments: Follow-up Information    Kathlee Nations Trigt III, MD Follow up on 08/23/2016.   Specialty:  Cardiothoracic Surgery Why:  PA/LAT CXR to be taken (at [name] which is in the same building as Dr. Zenaida Niece Trigt's office) on 08/31/2015 at 10:30 am ;Appointment time is at 11:00 am Contact information: [address] [place] [address] [phone]        Randall An Follow up on 08/03/2016.   Why:  Appointment time is at 1:30 pm Contact information: [address] [place], [place] [address]          Signed: Orson Ape 07/24/2016, 12:39 PM

## 2016-07-19 NOTE — Care Management Important Message (Signed)
Important Message  Patient Details  Name: Mercedes [name] MRN: [id number] Date of Birth: 21-Aug-1956   Medicare Important Message Given:  Yes    Kyla Balzarine 07/19/2016, 12:34 PM

## 2016-07-19 NOTE — Addendum Note (Signed)
Addendum  created 07/19/16 1337 by Jodell Cipro, CRNA   Anesthesia Intra Flowsheets edited

## 2016-07-19 NOTE — Progress Notes (Addendum)
2 Days Post-Op Procedure(s) (LRB): CORONARY ARTERY BYPASS GRAFTING (CABG)x3 using right greater saphenous vein harvested endoscopiclly and left internal mammary artery (N/A) TRANSESOPHAGEAL ECHOCARDIOGRAM (TEE) (N/A) Subjective: Weak but starting to walk nsr Weaning milrinone foe preop NSTEMI  Objective: Vital signs in last 24 hours: Temp:  [98.3 F (36.8 C)-99 F (37.2 C)] 98.3 F (36.8 C) (11/22 0900) Pulse Rate:  [42-87] 85 (11/22 0800) Cardiac Rhythm: Normal sinus rhythm (11/22 0400) Resp:  [14-31] 21 (11/22 0800) BP: (90-129)/(50-70) 129/59 (11/22 0800) SpO2:  [91 %-99 %] 91 % (11/22 0800) Weight:  [129 lb 3 oz (58.6 kg)] 129 lb 3 oz (58.6 kg) (11/22 0500)  Hemodynamic parameters for last 24 hours:  stable  Intake/Output from previous day: 11/21 0701 - 11/22 0700 In: 1002.9 [P.O.:270; I.V.:482.9; IV Piggyback:250] Out: 680 [Urine:610; Chest Tube:70] Intake/Output this shift: No intake/output data recorded.       Exam    General- alert and comfortable   Lungs- clear without rales, wheezes   Cor- regular rate and rhythm, no murmur , gallop   Abdomen- soft, non-tender   Extremities - warm, non-tender, minimal edema   Neuro- oriented, appropriate, no focal weakness   Lab Results:  Recent Labs  07/18/16 1820 07/19/16 0330  WBC 15.0* 15.2*  HGB 9.9* 9.4*  HCT 30.8* 29.4*  PLT 167 153   BMET:  Recent Labs  07/18/16 0400 07/18/16 1657 07/18/16 1820 07/19/16 0330  NA 135 134*  --  131*  K 4.0 4.5  --  4.5  CL 104 98*  --  99*  CO2 23  --   --  25  GLUCOSE 119* 121*  --  116*  BUN 8 14  --  19  CREATININE 0.79 1.40* 1.30* 1.38*  CALCIUM 8.1*  --   --  8.3*    PT/INR:  Recent Labs  07/17/16 1345  LABPROT 18.7*  INR 1.54   ABG    Component Value Date/Time   PHART 7.384 07/17/2016 2247   HCO3 24.1 07/17/2016 2247   TCO2 25 07/18/2016 1657   ACIDBASEDEF 1.0 07/17/2016 2247   O2SAT 92.0 07/17/2016 2247   CBG (last 3)   Recent Labs  07/18/16 2014 07/19/16 0042 07/19/16 0324  GLUCAP 133* 111* 118*    Assessment/Plan: S/P Procedure(s) (LRB): CORONARY ARTERY BYPASS GRAFTING (CABG)x3 using right greater saphenous vein harvested endoscopiclly and left internal mammary artery (N/A) TRANSESOPHAGEAL ECHOCARDIOGRAM (TEE) (N/A) Mobilize Diuresis Plan for transfer to step-down: see transfer orders  Wean milrinone On Eliquis preop for hx afib- continue at DC, loxvenox 30 for now  LOS: 8 days    Mercedes [name] 07/19/2016

## 2016-07-20 ENCOUNTER — Inpatient Hospital Stay (HOSPITAL_COMMUNITY): Payer: Medicare Other

## 2016-07-20 LAB — BASIC METABOLIC PANEL
Anion gap: 6 (ref 5–15)
BUN: 21 mg/dL — ABNORMAL HIGH (ref 6–20)
CO2: 25 mmol/L (ref 22–32)
Calcium: 8.2 mg/dL — ABNORMAL LOW (ref 8.9–10.3)
Chloride: 100 mmol/L — ABNORMAL LOW (ref 101–111)
Creatinine, Ser: 1.09 mg/dL — ABNORMAL HIGH (ref 0.44–1.00)
GFR calc Af Amer: >60 mL/min (ref >60)
GFR calc non Af Amer: 53 mL/min — ABNORMAL LOW (ref >60)
Glucose, Bld: 103 mg/dL — ABNORMAL HIGH (ref 65–99)
Potassium: 4.1 mmol/L (ref 3.5–5.1)
Sodium: 131 mmol/L — ABNORMAL LOW (ref 135–145)

## 2016-07-20 LAB — CBC
HCT: 27.5 % — ABNORMAL LOW (ref 36.0–46.0)
Hemoglobin: 8.9 g/dL — ABNORMAL LOW (ref 12.0–15.0)
MCH: 25 pg — ABNORMAL LOW (ref 26.0–34.0)
MCHC: 32.4 g/dL (ref 30.0–36.0)
MCV: 77.2 fL — ABNORMAL LOW (ref 78.0–100.0)
Platelets: 199 10*3/uL (ref 150–400)
RBC: 3.56 MIL/uL — ABNORMAL LOW (ref 3.87–5.11)
RDW: 15.1 % (ref 11.5–15.5)
WBC: 13 10*3/uL — ABNORMAL HIGH (ref 4.0–10.5)

## 2016-07-20 LAB — TYPE AND SCREEN
ABO/RH(D): B POS
Antibody Screen: NEGATIVE
Unit division: 0
Unit division: 0

## 2016-07-20 LAB — GLUCOSE, CAPILLARY
Glucose-Capillary: 118 mg/dL — ABNORMAL HIGH (ref 65–99)
Glucose-Capillary: 134 mg/dL — ABNORMAL HIGH (ref 65–99)
Glucose-Capillary: 136 mg/dL — ABNORMAL HIGH (ref 65–99)
Glucose-Capillary: 96 mg/dL (ref 65–99)

## 2016-07-20 MED ORDER — METOPROLOL TARTRATE 12.5 MG HALF TABLET
12.5000 mg | ORAL_TABLET | Freq: Two times a day (BID) | ORAL | Status: DC
  Administered 2016-07-20 – 2016-07-24 (×8): 12.5 mg via ORAL
  Filled 2016-07-20 (×8): qty 1

## 2016-07-20 MED ORDER — SODIUM CHLORIDE 0.9% FLUSH
3.0000 mL | Freq: Two times a day (BID) | INTRAVENOUS | Status: DC
  Administered 2016-07-20 – 2016-07-23 (×5): 3 mL via INTRAVENOUS

## 2016-07-20 MED ORDER — OXYCODONE HCL 5 MG PO TABS
5.0000 mg | ORAL_TABLET | ORAL | Status: DC | PRN
  Administered 2016-07-20 – 2016-07-24 (×9): 5 mg via ORAL
  Filled 2016-07-20 (×11): qty 1

## 2016-07-20 MED ORDER — POTASSIUM CHLORIDE CRYS ER 20 MEQ PO TBCR
20.0000 meq | EXTENDED_RELEASE_TABLET | Freq: Two times a day (BID) | ORAL | Status: DC
  Administered 2016-07-20 – 2016-07-21 (×3): 20 meq via ORAL
  Filled 2016-07-20 (×3): qty 1

## 2016-07-20 MED ORDER — BISACODYL 10 MG RE SUPP: 10.0000 mg | Freq: Every day | RECTAL | Status: DC | PRN

## 2016-07-20 MED ORDER — FAMOTIDINE 20 MG PO TABS
20.0000 mg | ORAL_TABLET | Freq: Two times a day (BID) | ORAL | Status: DC
  Administered 2016-07-20 – 2016-07-24 (×8): 20 mg via ORAL
  Filled 2016-07-20 (×8): qty 1

## 2016-07-20 MED ORDER — BISACODYL 5 MG PO TBEC: 10.0000 mg | DELAYED_RELEASE_TABLET | Freq: Every day | ORAL | Status: DC | PRN

## 2016-07-20 MED ORDER — ACETAMINOPHEN 325 MG PO TABS
650.0000 mg | ORAL_TABLET | Freq: Four times a day (QID) | ORAL | Status: DC | PRN
  Administered 2016-07-22: 650 mg via ORAL
  Filled 2016-07-20: qty 2

## 2016-07-20 MED ORDER — SODIUM CHLORIDE 0.9 % IV SOLN: 250.0000 mL | INTRAVENOUS | Status: DC | PRN

## 2016-07-20 MED ORDER — TRAMADOL HCL 50 MG PO TABS: 50.0000 mg | ORAL_TABLET | ORAL | Status: DC | PRN

## 2016-07-20 MED ORDER — ASPIRIN EC 325 MG PO TBEC
325.0000 mg | DELAYED_RELEASE_TABLET | Freq: Every day | ORAL | Status: DC
  Administered 2016-07-21 – 2016-07-24 (×4): 325 mg via ORAL
  Filled 2016-07-20 (×4): qty 1

## 2016-07-20 MED ORDER — SODIUM CHLORIDE 0.9% FLUSH: 10.0000 mL | INTRAVENOUS | Status: DC | PRN

## 2016-07-20 MED ORDER — ONDANSETRON HCL 4 MG PO TABS: 4.0000 mg | ORAL_TABLET | Freq: Four times a day (QID) | ORAL | Status: DC | PRN

## 2016-07-20 MED ORDER — ONDANSETRON HCL 4 MG/2ML IJ SOLN
4.0000 mg | Freq: Four times a day (QID) | INTRAMUSCULAR | Status: DC | PRN
Start: 2016-07-20 — End: 2016-07-24

## 2016-07-20 MED ORDER — SODIUM CHLORIDE 0.9% FLUSH: 3.0000 mL | INTRAVENOUS | Status: DC | PRN

## 2016-07-20 MED ORDER — DOCUSATE SODIUM 100 MG PO CAPS
200.0000 mg | ORAL_CAPSULE | Freq: Every day | ORAL | Status: DC
  Administered 2016-07-20 – 2016-07-24 (×5): 200 mg via ORAL
  Filled 2016-07-20 (×5): qty 2

## 2016-07-20 MED ORDER — FUROSEMIDE 40 MG PO TABS
40.0000 mg | ORAL_TABLET | Freq: Two times a day (BID) | ORAL | Status: DC
  Administered 2016-07-20: 40 mg via ORAL
  Filled 2016-07-20 (×2): qty 1

## 2016-07-20 MED ORDER — MOVING RIGHT ALONG BOOK
Freq: Once | Status: AC
  Administered 2016-07-20: 17:00:00
  Filled 2016-07-20: qty 1

## 2016-07-20 NOTE — Progress Notes (Signed)
3 Days Post-Op Procedure(s) (LRB): CORONARY ARTERY BYPASS GRAFTING (CABG)x3 using right greater saphenous vein harvested endoscopiclly and left internal mammary artery (N/A) TRANSESOPHAGEAL ECHOCARDIOGRAM (TEE) (N/A) Subjective:  No complaints  Objective: Vital signs in last 24 hours: Temp:  [97.8 F (36.6 C)-98.6 F (37 C)] 98.3 F (36.8 C) (11/23 0909) Pulse Rate:  [59-69] 67 (11/23 0800) Cardiac Rhythm: Normal sinus rhythm (11/23 0755) Resp:  [13-25] 19 (11/23 0800) BP: (98-142)/(61-86) 140/67 (11/23 0800) SpO2:  [93 %-98 %] 96 % (11/23 0800) Weight:  [58.2 kg (128 lb 4.9 oz)] 58.2 kg (128 lb 4.9 oz) (11/23 0500)  Hemodynamic parameters for last 24 hours:    Intake/Output from previous day: 11/22 0701 - 11/23 0700 In: 458 [P.O.:150; I.V.:308] Out: 750 [Urine:750] Intake/Output this shift: Total I/O In: 12 [I.V.:12] Out: -   General appearance: alert and cooperative Neurologic: intact Heart: regular rate and rhythm, S1, S2 normal, no murmur, click, rub or gallop Lungs: diminished breath sounds bibasilar Extremities: edema mild Wound: incisions ok  Lab Results:  Recent Labs  07/19/16 0330 07/20/16 0345  WBC 15.2* 13.0*  HGB 9.4* 8.9*  HCT 29.4* 27.5*  PLT 153 199   BMET:  Recent Labs  07/19/16 0330 07/20/16 0345  NA 131* 131*  K 4.5 4.1  CL 99* 100*  CO2 25 25  GLUCOSE 116* 103*  BUN 19 21*  CREATININE 1.38* 1.09*  CALCIUM 8.3* 8.2*    PT/INR:  Recent Labs  07/17/16 1345  LABPROT 18.7*  INR 1.54   ABG    Component Value Date/Time   PHART 7.384 07/17/2016 2247   HCO3 24.1 07/17/2016 2247   TCO2 25 07/18/2016 1657   ACIDBASEDEF 1.0 07/17/2016 2247   O2SAT 92.0 07/17/2016 2247   CBG (last 3)   Recent Labs  07/19/16 1649 07/20/16 0016 07/20/16 0906  GLUCAP 122* 118* 136*   CLINICAL DATA:  Post CABG.  EXAM: CHEST  2 VIEW  COMPARISON:  07/19/2016  FINDINGS: Sternotomy wires unchanged. Leadless pacer unchanged. Right  IJ venous sheath present with tip over the SVC. Lungs are adequately inflated and demonstrate slightly worse small effusions likely with associated basilar atelectasis. Minimal prominence of the central vascular markings suggesting mild vascular congestion. No pneumothorax. Mild stable cardiomegaly. Remainder of the exam is unchanged.  IMPRESSION: Small bilateral pleural effusions slightly worse likely with associated basilar atelectasis. Suggestion mild vascular congestion.  Stable cardiomegaly.  Right IJ venous sheath unchanged.   Electronically Signed   By: Elberta Fortis M.D.   On: 07/20/2016 07:58  Assessment/Plan: S/P Procedure(s) (LRB): CORONARY ARTERY BYPASS GRAFTING (CABG)x3 using right greater saphenous vein harvested endoscopiclly and left internal mammary artery (N/A) TRANSESOPHAGEAL ECHOCARDIOGRAM (TEE) (N/A)  She is hemodynamically stable in sinus rhythm. Still on Milrinone 0.125 mcg but EF is normal. Will stop it and remove sleeve.  Mild volume excess with small pleural effusions: continue diuresis.  Bibasilar atelectasis: IS, ambulation  Transfer to 2W    LOS: 9 days    [name] 07/20/2016

## 2016-07-20 NOTE — Progress Notes (Signed)
Patient Name: Mercedes [name] Date of Encounter: 07/20/2016  Primary Cardiologist: Dr. Janae Sauce Problem List     Principal Problem:   NSTEMI (non-ST elevated myocardial infarction) [facility]) Active Problems:   Dyslipidemia   Tachycardia-bradycardia [facility])   PVD- s/p Lt [facility] April 2015   Rheumatoid arthritis (HCC)   SLE (systemic lupus erythematosus) (HCC)   CAD - S/P Takotsubo MI in 2000, CFX DES Sept 2014   Chest pain   Paroxysmal atrial fibrillation (HCC)   S/P CABG x 3     Subjective   Has been progressing slowly, weaning milrinone. No significant shortness of breath. Encouraging movement. Walked this AM. Smiling. Eating breakfast.  Inpatient Medications    Scheduled Meds: . acetaminophen  1,000 mg Oral Q6H   Or  . acetaminophen (TYLENOL) oral liquid 160 mg/5 mL  1,000 mg Per Tube Q6H  . amiodarone  200 mg Oral Daily  . aspirin EC  325 mg Oral Daily   Or  . aspirin  324 mg Per Tube Daily  . bisacodyl  10 mg Oral Daily   Or  . bisacodyl  10 mg Rectal Daily  . docusate sodium  200 mg Oral Daily  . enoxaparin (LOVENOX) injection  30 mg Subcutaneous Q24H  . furosemide  40 mg Intravenous Daily  . mouth rinse  15 mL Mouth Rinse BID  . metoprolol tartrate  12.5 mg Oral BID   Or  . metoprolol tartrate  12.5 mg Per Tube BID  . mometasone-formoterol  2 puff Inhalation BID  . pantoprazole  40 mg Oral Daily  . rosuvastatin  40 mg Oral q1800  . sodium chloride flush  3 mL Intravenous Q12H  . sodium chloride flush  3 mL Intravenous Q12H   Continuous Infusions: . sodium chloride Stopped (07/18/16 0802)  . sodium chloride    . sodium chloride 20 mL/hr at 07/20/16 0423   PRN Meds: sodium chloride, sodium chloride, levalbuterol, metoprolol, ondansetron (ZOFRAN) IV, oxyCODONE, sodium chloride flush, sodium chloride flush, traMADol   Vital Signs    Vitals:   07/20/16 0400 07/20/16 0500 07/20/16 0700 07/20/16 0728  BP: 112/71 121/71 139/73   Pulse: 61 63 64     Resp: 14 14 16    Temp:      TempSrc:      SpO2: 93% 93% 95% 93%  Weight:  128 lb 4.9 oz (58.2 kg)    Height:        Intake/Output Summary (Last 24 hours) at 07/20/16 0816 Last data filed at 07/20/16 0700  Gross per 24 hour  Intake              436 ml  Output              750 ml  Net             -314 ml   Filed Weights   07/18/16 0500 07/19/16 0500 07/20/16 0500  Weight: 128 lb 4.9 oz (58.2 kg) 129 lb 3 oz (58.6 kg) 128 lb 4.9 oz (58.2 kg)    Physical Exam    GEN: Well nourished, well developed, in no acute distress.  HEENT: Grossly normal.  Neck: Supple, no JVD, carotid bruits, or masses. Cardiac: RRR, no murmurs, rubs, or gallops. No clubbing, cyanosis, edema.  Radials/DP/PT 2+ and equal bilaterally.  Respiratory:  Respirations regular and unlabored, clear to auscultation bilaterally. GI: Soft, nontender, nondistended, BS + x 4. MS: no deformity or atrophy. Skin: warm and dry, no  rash. Neuro:  Strength and sensation are intact. Psych: AAOx3.  Normal affect.  Labs    CBC  Recent Labs  07/19/16 0330 07/20/16 0345  WBC 15.2* 13.0*  HGB 9.4* 8.9*  HCT 29.4* 27.5*  MCV 78.0 77.2*  PLT 153 199   Basic Metabolic Panel   Telemetry    Normal sinus rhythm - Personally Reviewed  ECG     07/18/16-mild anterolateral ST elevation noted on ECG.- Personally Reviewed  Radiology    Dg Chest 2 View  Result Date: 07/20/2016 CLINICAL DATA:  Post CABG. EXAM: CHEST  2 VIEW COMPARISON:  07/19/2016 FINDINGS: Sternotomy wires unchanged. Leadless pacer unchanged. Right IJ venous sheath present with tip over the SVC. Lungs are adequately inflated and demonstrate slightly worse small effusions likely with associated basilar atelectasis. Minimal prominence of the central vascular markings suggesting mild vascular congestion. No pneumothorax. Mild stable cardiomegaly. Remainder of the exam is unchanged. IMPRESSION: Small bilateral pleural effusions slightly worse likely with  associated basilar atelectasis. Suggestion mild vascular congestion. Stable cardiomegaly. Right IJ venous sheath unchanged. Electronically Signed   By: Elberta Fortis M.D.   On: 07/20/2016 07:58   Dg Chest Port 1 View  Result Date: 07/19/2016 CLINICAL DATA:  Chest soreness, recent CABG EXAM: PORTABLE CHEST 1 VIEW COMPARISON:  Portable chest x-ray of 07/18/2006 FINDINGS: The Swan-Ganz catheter has been removed with a venous sheath remaining in the SVC. The left chest tube also has been removed and no pneumothorax is seen. Mild bibasilar atelectasis is present. Mild cardiomegaly is stable. Median sternotomy sutures are noted. Implanted loop recorder is noted. IMPRESSION: 1. Swan-Ganz catheter and left chest tube removed.  No pneumothorax. 2. Bibasilar atelectasis. 3. Stable cardiomegaly with implanted loop recorder. Electronically Signed   By: Dwyane Dee M.D.   On: 07/19/2016 08:01    Cardiac Studies   ECHO 07/14/16: - Left ventricle: The cavity size was normal. Wall thickness was   increased in a pattern of mild LVH. Systolic function was normal.   The estimated ejection fraction was in the range of 55% to 60%.   Wall motion was normal; there were no regional wall motion   abnormalities. Features are consistent with a pseudonormal left   ventricular filling pattern, with concomitant abnormal relaxation   and increased filling pressure (grade 2 diastolic dysfunction). - Mitral valve: There was mild regurgitation. - Left atrium: The atrium was mildly dilated. - Right ventricle: Systolic function was mildly reduced. - Tricuspid valve: There was moderate regurgitation. - Pulmonary arteries: Systolic pressure was mildly increased. PA   peak pressure: 37 mm Hg (S).  Cath 07/14/16:  Unchanged hazy lesions along proximal margin of proximal LCx stent protruding into the LMCA. This results in 40-50% distal LMCA and 70-80% ostial LAD stenoses.  Intravascular ultrasound of LAD and LMCA demonstrates  focal stenosis involving distal LMCA and ostial LAD with MLA of 4.5 mm^2. The plaque morphology appears most consistent with fibrosis and calcification rather than thrombus.   Cath 07/12/16  The left ventricular ejection fraction is 50-55% by visual estimate.  There is mild left ventricular systolic dysfunction.  1st Diag lesion, 30 %stenosed.  Ost Cx to Prox Cx lesion, 0 %stenosed.   Low normal global LV function with an ejection fraction of 50-55% but with focal hypocontractility involving the upper mid to basal inferior wall and distal inferior apical segment.  There is a widely patent stent at the ostium of the left circumflex coronary artery, which seems to extend slightly into the  distal left main.  There appears to be 2 areas of focal thrombus; one appears to be at the distal left main outside the stent strut which extends into the left main and at the ostium of the LAD outside the stent strut which appears to slightly jail this segment of LAD.  Mild 30% diagonal stenosis of the LAD.  Normal RCA.  Patient Profile     60 year old with non-ST elevation myocardial infarction, peak troponin 4.1, ejection fraction 50-55% with prior circumflex stent into the left main artery, requiring bypass surgery, slow recovery, milrinone.  Assessment & Plan    Non-ST elevation myocardial infarction  - 2 previous areas of focal thrombus in the distal left main outside of the stent strut and at the ostium of the LAD.  - Would recommend dual antiplatelet therapy with aspirin and clopidogrel when able to start clopidogrel. Risk of post surgical bleeding noted.  - Overall reassuring ejection fraction  - If she continues to require milrinone, consider repeating echocardiogram to assess left ventricular function post bypass. Hopefully weaning off today. BP looks improved.  - Low-dose beta blocker when able (currently on medication list)  - Crestor 40 mg-excellent-LDL 63/86 previously  - AMIO 200  prophylaxis. We will continue to follow.  Signed, Donato Schultz, MD  07/20/2016, 8:16 AM

## 2016-07-20 NOTE — Plan of Care (Signed)
Problem: Activity: Goal: Risk for activity intolerance will decrease Outcome: Progressing Pt ambulated to BR x2 this pm

## 2016-07-21 DIAGNOSIS — I2510 Atherosclerotic heart disease of native coronary artery without angina pectoris: Secondary | ICD-10-CM

## 2016-07-21 LAB — BASIC METABOLIC PANEL
ANION GAP: 11 (ref 5–15)
BUN: 17 mg/dL (ref 6–20)
CALCIUM: 8.3 mg/dL — AB (ref 8.9–10.3)
CO2: 22 mmol/L (ref 22–32)
Chloride: 102 mmol/L (ref 101–111)
Creatinine, Ser: 0.98 mg/dL (ref 0.44–1.00)
GLUCOSE: 96 mg/dL (ref 65–99)
Potassium: 4.3 mmol/L (ref 3.5–5.1)
SODIUM: 135 mmol/L (ref 135–145)

## 2016-07-21 LAB — CBC
HCT: 28.4 % — ABNORMAL LOW (ref 36.0–46.0)
Hemoglobin: 9 g/dL — ABNORMAL LOW (ref 12.0–15.0)
MCH: 24.7 pg — ABNORMAL LOW (ref 26.0–34.0)
MCHC: 31.7 g/dL (ref 30.0–36.0)
MCV: 77.8 fL — AB (ref 78.0–100.0)
PLATELETS: 280 10*3/uL (ref 150–400)
RBC: 3.65 MIL/uL — ABNORMAL LOW (ref 3.87–5.11)
RDW: 15.2 % (ref 11.5–15.5)
WBC: 10.4 10*3/uL (ref 4.0–10.5)

## 2016-07-21 MED ORDER — FUROSEMIDE 40 MG PO TABS
40.0000 mg | ORAL_TABLET | Freq: Every day | ORAL | Status: DC
  Administered 2016-07-21 – 2016-07-24 (×4): 40 mg via ORAL
  Filled 2016-07-21 (×4): qty 1

## 2016-07-21 MED ORDER — LISINOPRIL 5 MG PO TABS
5.0000 mg | ORAL_TABLET | Freq: Every day | ORAL | Status: DC
  Administered 2016-07-21 – 2016-07-24 (×4): 5 mg via ORAL
  Filled 2016-07-21 (×4): qty 1

## 2016-07-21 MED ORDER — LACTULOSE 10 GM/15ML PO SOLN
20.0000 g | Freq: Once | ORAL | Status: AC
  Administered 2016-07-21: 20 g via ORAL
  Filled 2016-07-21: qty 30

## 2016-07-21 NOTE — Progress Notes (Signed)
Checked pt O2 on room air. Pt O2 level 78%. Pt denied being SOB. Pt returned to 2L and coached through incentive spirometry. Achieved 500-750 on incentive spirometry. Pt verbalized understanding of needing to do IS 10 times per hour. Pt oxygen level returned to 91% on 2L. Call light within reach, will continue to monitor.  Leonidas Romberg, RN

## 2016-07-21 NOTE — Progress Notes (Addendum)
      [address]       [place] [address]             [phone]      4 Days Post-Op Procedure(s) (LRB): CORONARY ARTERY BYPASS GRAFTING (CABG)x3 using right greater saphenous vein harvested endoscopiclly and left internal mammary artery (N/A) TRANSESOPHAGEAL ECHOCARDIOGRAM (TEE) (N/A)   Subjective:  Mercedes [name] is doing pretty well.  She does have some mild chest discomfort.   No BM  Objective: Vital signs in last 24 hours: Temp:  [97.7 F (36.5 C)-98.3 F (36.8 C)] 98.1 F (36.7 C) (11/24 0457) Pulse Rate:  [66-77] 71 (11/24 0457) Cardiac Rhythm: Normal sinus rhythm (11/23 1900) Resp:  [17-22] 20 (11/24 0457) BP: (117-140)/(59-74) 130/74 (11/24 0457) SpO2:  [90 %-97 %] 90 % (11/24 0457) Weight:  [123 lb 9.6 oz (56.1 kg)] 123 lb 9.6 oz (56.1 kg) (11/24 0457)  Intake/Output from previous day: 11/23 0701 - 11/24 0700 In: 534 [P.O.:480; I.V.:54] Out: 1600 [Urine:1600]  General appearance: alert, cooperative and no distress Heart: regular rate and rhythm Lungs: clear to auscultation bilaterally Abdomen: soft, non-tender; bowel sounds normal; no masses,  no organomegaly Extremities: edema trace Wound: clean and dry  Lab Results:  Recent Labs  07/20/16 0345 07/21/16 0242  WBC 13.0* 10.4  HGB 8.9* 9.0*  HCT 27.5* 28.4*  PLT 199 280   BMET:  Recent Labs  07/20/16 0345 07/21/16 0242  NA 131* 135  K 4.1 4.3  CL 100* 102  CO2 25 22  GLUCOSE 103* 96  BUN 21* 17  CREATININE 1.09* 0.98  CALCIUM 8.2* 8.3*    PT/INR: No results for input(s): LABPROT, INR in the last 72 hours. ABG    Component Value Date/Time   PHART 7.384 07/17/2016 2247   HCO3 24.1 07/17/2016 2247   TCO2 25 07/18/2016 1657   ACIDBASEDEF 1.0 07/17/2016 2247   O2SAT 92.0 07/17/2016 2247   CBG (last 3)   Recent Labs  07/20/16 0345 07/20/16 0906 07/20/16 1259  GLUCAP 96 136* 134*    Assessment/Plan: S/P Procedure(s) (LRB): CORONARY ARTERY BYPASS GRAFTING  (CABG)x3 using right greater saphenous vein harvested endoscopiclly and left internal mammary artery (N/A) TRANSESOPHAGEAL ECHOCARDIOGRAM (TEE) (N/A)  1. CV- NSR, occasional PVCs- mild HTN- continue Lopressor, Amiodarone, Lisinopril... D/c EPW 2. Pulm- wean oxygen as tolerated 3. Renal- creatinine stable, weight is trending down, will decrease Lasix to once a day 4. GI- constipation, will order Lactulose 5. DM- Hgb a1c is 6.0.... Not on medication prior to discharge, will continue close follow up with PCP 6. Deconditioning- patient lives alone, will get PT/OT consult, will likely need SNF 7. Dispo- patient stable, will add Lisinopril for better BP control, decrease Lasix to once a day dosing, get PT consult... Will need SNF at discharge, will place CSW consult   LOS: 10 days    Raford Pitcher, ERIN 07/21/2016  Patient starting to walk and take better oral diet Would benefit from skilled nursing facility and later   transition to home patient examined and medical record reviewed,agree with above note. Continue oral Lasix for 3 weeks Kathlee Nations Trigt III 07/21/2016

## 2016-07-21 NOTE — Clinical Social Work Placement (Signed)
   CLINICAL SOCIAL WORK PLACEMENT  NOTE  Date:  07/21/2016  Patient Details  Name: CHESTINA KOMATSU MRN: [id number] Date of Birth: 05/11/1956  Clinical Social Work is seeking post-discharge placement for this patient at the   level of care (*CSW will initial, date and re-position this form in  chart as items are completed):  Yes   Patient/family provided with [facility] Clinical Social Work Department's list of facilities offering this level of care within the geographic area requested by the patient (or if unable, by the patient's family).  Yes   Patient/family informed of their freedom to choose among providers that offer the needed level of care, that participate in Medicare, Medicaid or managed care program needed by the patient, have an available bed and are willing to accept the patient.  Yes   Patient/family informed of [facility]'s ownership interest in [facility] and [facility], as well as of the fact that they are under no obligation to receive care at these facilities.  PASRR submitted to EDS on       PASRR number received on       Existing PASRR number confirmed on       FL2 transmitted to all facilities in geographic area requested by pt/family on       FL2 transmitted to all facilities within larger geographic area on       Patient informed that his/her managed care company has contracts with or will negotiate with certain facilities, including the following:            Patient/family informed of bed offers received.  Patient chooses bed at       Physician recommends and patient chooses bed at      Patient to be transferred to   on  .  Patient to be transferred to facility by       Patient family notified on   of transfer.  Name of family member notified:        PHYSICIAN Please sign FL2     Additional Comment:    _______________________________________________ Marrianne Mood, MSW,  LCSWA [phone]

## 2016-07-21 NOTE — Progress Notes (Signed)
CARDIAC REHAB PHASE I   PRE:  Rate/Rhythm: 68 SR  BP:  Sitting: 128/86        SaO2: 97 2L  MODE:  Ambulation: 260 ft   POST:  Rate/Rhythm: 85 SR c/ occ PVC  BP:  Sitting: 142/80         SaO2: 95 2L  Pt stood with min assist, good use of sternal precautions. Pt ambulated 260 ft on 2L O2, rolling walker, very slow, mostly steady gait, tolerated well with no complaints. Encouraged IS, additional ambulation x2 today. Pt to bed for EPW removal after walk, call bell within reach. Will follow.    [phone] Joylene Grapes, RN, BSN 07/21/2016 9:23 AM

## 2016-07-21 NOTE — NC FL2 (Signed)
[place] MEDICAID FL2 LEVEL OF CARE SCREENING TOOL     IDENTIFICATION  Patient Name: Mercedes [name] Birthdate: 1956/07/12 Sex: female Admission Date (Current Location): 07/11/2016  [facility] and [place] Number:  Producer, television/film/video and Address:  [facility] [place], [address] [place], [place] [address]      Provider Number: [id number]  Attending Physician Name and Address:  Kerin Perna, MD  Relative Name and Phone Number:       Current Level of Care: Hospital Recommended Level of Care: Skilled Nursing Facility Prior Approval Number:    Date Approved/Denied: 12/05/13 PASRR Number: [id number]  Discharge Plan: Home    Current Diagnoses: Patient Active Problem List   Diagnosis Date Noted  . Coronary artery disease involving native heart without angina pectoris   . Hx of CABG 07/17/2016  . Paroxysmal atrial fibrillation (HCC) 07/12/2016  . Chest pain 07/11/2016  . NSTEMI (non-ST elevated myocardial infarction) (HCC) 07/11/2016  . History of arterial bypass of lower extremity 12/30/2015  . Aftercare following surgery of the circulatory system 12/30/2015  . Claudication of right lower extremity (HCC) 12/30/2015  . COPD exacerbation (HCC) 10/04/2015  . Phantom limb pain-Left great toe 04/29/2015  . Seizure (HCC)   . Atrial flutter with rapid ventricular response vs. SVT 10/27/2014    Class: Acute  . Noncompliance with medication regimen 10/27/2014  . 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 PAD (peripheral artery disease) (HCC) 12/02/2013  . Unstable angina-jaw pain 09/03/2013  . Abnormal LFTs on Lipitor- changed to Crestor but pt not compliant 09/03/2013  . SLE (systemic lupus erythematosus) (HCC)   . CAD - S/P Takotsubo MI in 2000, CFX DES Sept 2014 05/25/2013  . Acute MI, inferior wall (HCC) 05/19/2013  . 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 Tachycardia-bradycardia (HCC) 03/09/2010  .  DEPRESSION 11/02/2009  . PVD- s/p Lt [facility] April 2015 10/29/2009  . TOBACCO ABUSE 09/17/2009  . Dyslipidemia 08/26/2009  . Essential hypertension 08/26/2009  . CORONARY ATHEROSCLEROSIS NATIVE CORONARY ARTERY 08/26/2009  . 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 GERD 08/26/2009  . Rheumatoid arthritis (HCC) 08/26/2009    Orientation RESPIRATION BLADDER Height & Weight     Self, Time, Situation, Place  O2 (Nasual Cannual; 2 L) Continent Weight: 123 lb 9.6 oz (56.1 kg) Height:  5\' 6"  (167.6 cm)  BEHAVIORAL SYMPTOMS/MOOD NEUROLOGICAL BOWEL NUTRITION STATUS      Continent Diet (Heart healthy/carb modified; thin liquids)  AMBULATORY STATUS COMMUNICATION OF NEEDS Skin   Limited Assist Verbally Surgical wounds (Closed: Chest (Silver hydrofiber dressing, change every 3 days), Closed: Right Leg (gauze dressing, change every 2 days))                       Personal Care Assistance Level of Assistance  Bathing, Feeding, Dressing Bathing Assistance: Limited assistance Feeding assistance: Independent Dressing Assistance: Limited assistance     Functional Limitations Info  Sight, Hearing, Speech Sight Info: Adequate Hearing Info: Adequate Speech Info: Adequate    SPECIAL CARE FACTORS FREQUENCY  OT (By licensed OT), PT (By licensed PT)     PT Frequency: 3x week OT Frequency:  3x week            Contractures Contractures Info: Not present    Additional Factors Info  Code Status, Allergies Code Status Info: Full Allergies Info: Brilinta Ticagrelor           Current Medications (07/21/2016):  This  is the current hospital active medication list Current Facility-Administered Medications  Medication Dose Route Frequency Provider Last Rate Last Dose  . 0.9 %  sodium chloride infusion  250 mL Intravenous PRN [name], MD      . acetaminophen (TYLENOL) tablet 650 mg  650 mg Oral Q6H PRN [name], MD      . amiodarone (PACERONE) tablet 200 mg  200 mg Oral Daily  Kerin Perna, MD   200 mg at 07/21/16 1044  . aspirin EC tablet 325 mg  325 mg Oral Daily [name], MD   325 mg at 07/21/16 1044  . bisacodyl (DULCOLAX) EC tablet 10 mg  10 mg Oral Daily PRN [name], MD       Or  . bisacodyl (DULCOLAX) suppository 10 mg  10 mg Rectal Daily PRN [name], MD      . docusate sodium (COLACE) capsule 200 mg  200 mg Oral Daily [name], MD   200 mg at 07/21/16 1044  . enoxaparin (LOVENOX) injection 30 mg  30 mg Subcutaneous Q24H Kerin Perna, MD   30 mg at 07/20/16 1826  . famotidine (PEPCID) tablet 20 mg  20 mg Oral BID [name], MD   20 mg at 07/21/16 1044  . furosemide (LASIX) tablet 40 mg  40 mg Oral Daily Erin R Barrett, PA-C   40 mg at 07/21/16 1044  . levalbuterol (XOPENEX) nebulizer solution 0.63 mg  0.63 mg Nebulization Q6H PRN Sharlene Dory, PA-C      . lisinopril (PRINIVIL,ZESTRIL) tablet 5 mg  5 mg Oral Daily Erin R Barrett, PA-C   5 mg at 07/21/16 1044  . MEDLINE mouth rinse  15 mL Mouth Rinse BID Kerin Perna, MD   15 mL at 07/21/16 1000  . metoprolol tartrate (LOPRESSOR) tablet 12.5 mg  12.5 mg Oral BID [name], MD   12.5 mg at 07/21/16 1043  . mometasone-formoterol (DULERA) 100-5 MCG/ACT inhaler 2 puff  2 puff Inhalation BID Sharlene Dory, PA-C   2 puff at 07/20/16 2482  . ondansetron (ZOFRAN) tablet 4 mg  4 mg Oral Q6H PRN [name], MD       Or  . ondansetron (ZOFRAN) injection 4 mg  4 mg Intravenous Q6H PRN [name], MD      . oxyCODONE (Oxy IR/ROXICODONE) immediate release tablet 5-10 mg  5-10 mg Oral Q3H PRN [name], MD   5 mg at 07/21/16 0802  . potassium chloride SA (K-DUR,KLOR-CON) CR tablet 20 mEq  20 mEq Oral BID [name], MD   20 mEq at 07/21/16 1044  . rosuvastatin (CRESTOR) tablet 40 mg  40 mg Oral q1800 Sharlene Dory, PA-C   40 mg at 07/20/16 1826  . sodium chloride flush (NS) 0.9 % injection 10-40 mL  10-40 mL Intracatheter PRN Kerin Perna, MD      . sodium  chloride flush (NS) 0.9 % injection 3 mL  3 mL Intravenous Q12H [name], MD   3 mL at 07/20/16 2231  . sodium chloride flush (NS) 0.9 % injection 3 mL  3 mL Intravenous PRN [name], MD      . traMADol Janean Sark) tablet 50-100 mg  50-100 mg Oral Q4H PRN [name], MD         Discharge Medications: Please see discharge summary for a list of discharge medications.  Relevant  Imaging Results:  Relevant Lab Results:   Additional Information SSN: [id number]  Volney American, LCSW

## 2016-07-21 NOTE — Progress Notes (Signed)
EPW removed per protocol. VSS. Pt educated on bedrest until 10:40 AM. Call light within reach, will continue to monitor.   Leonidas Romberg, RN

## 2016-07-21 NOTE — Clinical Social Work Note (Signed)
Clinical Social Work Assessment  Patient Details  Name: Mercedes [name] MRN: [id number] Date of Birth: 1956-03-31  Date of referral:  07/21/16               Reason for consult:  Discharge Planning                Permission sought to share information with:  Family Supports Permission granted to share information::  Yes, Release of Information Signed  Name::     Stefan Church  Agency::     Relationship::  sister  Contact Information:  [phone]  Housing/Transportation Living arrangements for the past 2 months:  Single Family Home Source of Information:  Patient Patient Interpreter Needed:  None Criminal Activity/Legal Involvement Pertinent to Current Situation/Hospitalization:  No - Comment as needed Significant Relationships:  Adult Children, Siblings, Friend Lives with:  Self Do you feel safe going back to the place where you live?  Yes Need for family participation in patient care:  Yes (Comment)  Care giving concerns:  No family/friends at bedside. Patient stated she has family in the [place] area that are supportive and her daughter Lebron Quam) lives in [place] but is currently in town to help out with patients care.  Social Worker assessment / plan:  Holiday representative met patient at bedside to offer support and discuss patient needs at discharge. Patient states that she lives by herself but has family in the area. Patient is agreeable to SNF placement and would prefer the [facility] area to stay close to family. Patient was apprehensive about SNF placement because she thought it was a nursing home but after some clarification by CSW she accepted SNF placement. CSW to follow up with patient one bed offers are available. CSW remains available for support and to facilitate patient discharge needs once medically ready.   Employment status:  Retired Forensic scientist:  Medicare PT Recommendations:  [place] / Referral to community resources:   [place]  Patient/Family's Response to care:  Patient verbalized appreciation and understanding for CSW role and involvement in care. Patient agreeable with current discharge plan to SNF following discharge.  Patient/Family's Understanding of and Emotional Response to Diagnosis, Current Treatment, and Prognosis:  Patient has good understanding of current medical state and limitations around most recent hospitalization. Patient is agreeable with SNF placement in hopes of transitioning back to independent living. Emotional Assessment Appearance:  Appears stated age Attitude/Demeanor/Rapport:  Other (attitude/demeanor appropriate ) Affect (typically observed):  Calm, Happy, Pleasant, Stable Orientation:    Alcohol / Substance use:  Not Applicable Psych involvement (Current and /or in the community):  No (Comment)  Discharge Needs  Concerns to be addressed:  Discharge Planning Concerns Readmission within the last 30 days:  No Current discharge risk:  None Barriers to Discharge:  Barriers Resolved   Wende Neighbors, LCSW 07/21/2016, 4:01 PM

## 2016-07-21 NOTE — Evaluation (Signed)
Physical Therapy Evaluation Patient Details Name: Mercedes [name] MRN: [id number] DOB: [birth year] Today's Date: 07/21/2016   History of Present Illness  Patient is a 60 yo female s/p CORONARY ARTERY BYPASS GRAFTING (CABG)x3 using right greater saphenous vein harvested endoscopiclly and left internal mammary artery   Clinical Impression  Patient demonstrates deficits in functional mobility as indicated below. Will need continued skilled PT to address deficits and maximize function. Will see as indicated and progress as tolerated.     Follow Up Recommendations SNF;Supervision/Assistance - 24 hour    Equipment Recommendations  None recommended by PT    Recommendations for Other Services       Precautions / Restrictions Precautions Precautions: Sternal Restrictions Weight Bearing Restrictions: Yes (sternal precautions)      Mobility  Bed Mobility Overal bed mobility: Needs Assistance Bed Mobility: Supine to Sit;Sit to Supine     Supine to sit: Mod assist Sit to supine: Min assist   General bed mobility comments: VCs for technique with sternal compliance, assist to elevate trunk and rotate to EOB, upon return to supine assist to elevate LEs and control descent to supine  Transfers Overall transfer level: Needs assistance Equipment used: Rolling walker (2 wheeled) Transfers: Sit to/from Stand Sit to Stand: Min assist         General transfer comment: min assist for stability, difficulty powering up without use of BUEs (JK:DTOIZTI compliance)  Ambulation/Gait Ambulation/Gait assistance: Min guard Ambulation Distance (Feet): 120 Feet Assistive device: Rolling walker (2 wheeled) Gait Pattern/deviations: Decreased stride length;Narrow base of support Gait velocity: decreased Gait velocity interpretation: Below normal speed for age/gender General Gait Details: slow gaurded gait, VCs for increased cadence. Some instability noted but overall no overt LOB while using  Rw  Stairs            Wheelchair Mobility    Modified Rankin (Stroke Patients Only)       Balance Overall balance assessment: No apparent balance deficits (not formally assessed)                                           Pertinent Vitals/Pain Pain Assessment: Faces Faces Pain Scale: Hurts little more Pain Location: chest/incision Pain Descriptors / Indicators: Sore Pain Intervention(s): Monitored during session    Home Living Family/patient expects to be discharged to:: Private residence Living Arrangements: Alone Available Help at Discharge: Available PRN/intermittently Type of Home: House Home Access: Stairs to enter Entrance Stairs-Rails: None Entrance Stairs-Number of Steps: 2 Home Layout: One level Home Equipment: Environmental consultant - 2 wheels      Prior Function Level of Independence: Independent               Hand Dominance   Dominant Hand: Right    Extremity/Trunk Assessment               Lower Extremity Assessment: Overall WFL for tasks assessed      Cervical / Trunk Assessment: Kyphotic  Communication   Communication: No difficulties  Cognition Arousal/Alertness: Awake/alert Behavior During Therapy: WFL for tasks assessed/performed Overall Cognitive Status: Within Functional Limits for tasks assessed                      General Comments      Exercises     Assessment/Plan    PT Assessment Patient needs continued PT services  PT Problem List Decreased strength;Decreased  activity tolerance;Decreased mobility;Decreased knowledge of precautions;Pain          PT Treatment Interventions DME instruction;Gait training;Stair training;Functional mobility training;Therapeutic activities;Therapeutic exercise;Balance training;Patient/family education    PT Goals (Current goals can be found in the Care Plan section)  Acute Rehab PT Goals Patient Stated Goal: to get stronger PT Goal Formulation: With patient Time  For Goal Achievement: 08/04/16 Potential to Achieve Goals: Good    Frequency Min 3X/week   Barriers to discharge Decreased caregiver support lives alone    Co-evaluation               End of Session Equipment Utilized During Treatment: Gait belt;Oxygen Activity Tolerance: Patient tolerated treatment well Patient left: in bed;with call bell/phone within reach Nurse Communication: Mobility status         Time: 3335-4562 PT Time Calculation (min) (ACUTE ONLY): 21 min   Charges:   PT Evaluation $PT Eval Moderate Complexity: 1 Procedure     PT G Codes:        Fabio Asa 2016/07/22, 1:27 PM Charlotte Crumb, PT DPT  [phone]

## 2016-07-22 ENCOUNTER — Inpatient Hospital Stay (HOSPITAL_COMMUNITY): Payer: Medicare Other

## 2016-07-22 LAB — CBC
HCT: 28.9 % — ABNORMAL LOW (ref 36.0–46.0)
Hemoglobin: 9.4 g/dL — ABNORMAL LOW (ref 12.0–15.0)
MCH: 25.3 pg — ABNORMAL LOW (ref 26.0–34.0)
MCHC: 32.5 g/dL (ref 30.0–36.0)
MCV: 77.7 fL — ABNORMAL LOW (ref 78.0–100.0)
Platelets: 337 10*3/uL (ref 150–400)
RBC: 3.72 MIL/uL — ABNORMAL LOW (ref 3.87–5.11)
RDW: 15 % (ref 11.5–15.5)
WBC: 9.1 10*3/uL (ref 4.0–10.5)

## 2016-07-22 MED ORDER — POTASSIUM CHLORIDE CRYS ER 20 MEQ PO TBCR
20.0000 meq | EXTENDED_RELEASE_TABLET | Freq: Every day | ORAL | Status: DC
  Administered 2016-07-22 – 2016-07-24 (×3): 20 meq via ORAL
  Filled 2016-07-22 (×3): qty 1

## 2016-07-22 NOTE — Progress Notes (Signed)
CARDIAC REHAB PHASE I   PRE:  Rate/Rhythm: 75 nsr  BP:  Sitting: 120/76      SaO2: 94 2L, 90 RA (unsure if saturations correct r/t dark nail polish. Hard to get signal)  MODE:  Ambulation: 200 ft   POST:  Rate/Rhythm: 108 decreased to 73  BP:  Sitting: 110/64     SaO2: 96 RA 1515-1540 Patient ambulated in hallway with RW on RA. Hard to get O2 saturation r/t dark grey nail polish. When saturations would pick up regular HR, saturations were 96%. Patient denied any cardiac or respiratory complaints. Post ambulation patient back to room and assisted to bedside sitting. Family in room. Patient encouraged to ambulate again today before bed. Encouraged family to bring polish remover to that correct saturations can be obtained prior to D/C to SNF.  Kodee Drury English PayneRN, BSN 07/22/2016 4:21 PM

## 2016-07-22 NOTE — Plan of Care (Signed)
Problem: Bowel/Gastric: Goal: Will not experience complications related to bowel motility Outcome: Progressing Pt had BM today following lactulose dose.

## 2016-07-22 NOTE — Progress Notes (Addendum)
      [address]       [place] [address]             [phone]        5 Days Post-Op Procedure(s) (LRB): CORONARY ARTERY BYPASS GRAFTING (CABG)x3 using right greater saphenous vein harvested endoscopiclly and left internal mammary artery (N/A) TRANSESOPHAGEAL ECHOCARDIOGRAM (TEE) (N/A)  Subjective: Patient without specific complaints this am. She did walk two times yesterday. She had a bowel movement.  Objective: Vital signs in last 24 hours: Temp:  [98 F (36.7 C)-99.2 F (37.3 C)] 98 F (36.7 C) (11/25 0707) Pulse Rate:  [65-76] 76 (11/25 0707) Cardiac Rhythm: Normal sinus rhythm (11/24 1901) Resp:  [16-21] 18 (11/25 0707) BP: (107-146)/(64-73) 136/67 (11/25 0707) SpO2:  [78 %-100 %] 90 % (11/25 0707) Weight:  [121 lb 9.6 oz (55.2 kg)] 121 lb 9.6 oz (55.2 kg) (11/25 0707)  Pre op weight 58.2 kg Current Weight  07/22/16 121 lb 9.6 oz (55.2 kg)      Intake/Output from previous day: 11/24 0701 - 11/25 0700 In: 480 [P.O.:480] Out: 650 [Urine:650]   Physical Exam:  Cardiovascular: RRR Pulmonary: Diminished at bases Abdomen: Soft, non tender, bowel sounds present. Extremities: No lower extremity edema. Wounds: Clean and dry.  No erythema or signs of infection.  Lab Results: CBC: Recent Labs  07/20/16 0345 07/21/16 0242  WBC 13.0* 10.4  HGB 8.9* 9.0*  HCT 27.5* 28.4*  PLT 199 280   BMET:  Recent Labs  07/20/16 0345 07/21/16 0242  NA 131* 135  K 4.1 4.3  CL 100* 102  CO2 25 22  GLUCOSE 103* 96  BUN 21* 17  CREATININE 1.09* 0.98  CALCIUM 8.2* 8.3*    PT/INR:  Lab Results  Component Value Date   INR 1.54 07/17/2016   INR 1.11 07/15/2016   INR 1.10 07/12/2016   ABG:  INR: Will add last result for INR, ABG once components are confirmed Will add last 4 CBG results once components are confirmed  Assessment/Plan:  1. CV - On Amiodarone 200 mg daily, Lisinopril 5 mg daily, Lopressor 12.5 mg bid. 2.  Pulmonary - On 2  liters of oxygen via [place]. Wean to room air as tolerates. Encourage incentive spirometer. 3. Volume Overload - On Lasix 40 mg daily. 4.  Acute blood loss anemia - H and H yesterday stable at 9 and 28.4 5. CBGs 96/136/134 . Pre op HGA1C 6. She is likely pre diabetic. She will need to follow up with her medical doctor after discharge.  6. Will need SNF as lives alone, likely Monday.  ZIMMERMAN,DONIELLE MPA-C 07/22/2016,7:24 AM   Chart reviewed, patient examined, agree with above. She is doing well but says that her daughter lives in [place] and works and sister in [place] can't stay with her.

## 2016-07-23 NOTE — Discharge Instructions (Signed)
We ask the SNF to please do the following: 1. Please obtain vital signs at least one time daily 2.Please weigh the patient daily. If he or she continues to gain weight or develops lower extremity edema, contact the office at [phone]. 3. Ambulate patient at least three times daily and please use sternal precautions.  Coronary Artery Bypass Grafting, Care After Refer to this sheet in the next few weeks. These instructions provide you with information on caring for yourself after your procedure. Your health care provider may also give you more specific instructions. Your treatment has been planned according to current medical practices, but problems sometimes occur. Call your health care provider if you have any problems or questions after your procedure. WHAT TO EXPECT AFTER THE PROCEDURE Recovery from surgery will be different for everyone. Some people feel well after 3 or 4 weeks, while for others it takes longer. After your procedure, it is typical to have the following:  Nausea and a lack of appetite.   Constipation.  Weakness and fatigue.   Depression or irritability.   Pain or discomfort at your incision site. HOME CARE INSTRUCTIONS  Take medicines only as directed by your health care provider. Do not stop taking medicines or start any new medicines without first checking with your health care provider.  Take your pulse as directed by your health care provider.  Perform deep breathing as directed by your health care provider. If you were given a device called an incentive spirometer, use it to practice deep breathing several times a day. Support your chest with a pillow or your arms when you take deep breaths or cough.  Keep incision areas clean, dry, and protected. Remove or change any bandages (dressings) only as directed by your health care provider. You may have skin adhesive strips over the incision areas. Do not take the strips off. They will fall off on their  own.  Check incision areas daily for any swelling, redness, or drainage.  If incisions were made in your legs, do the following:  Avoid crossing your legs.   Avoid sitting for long periods of time. Change positions every 30 minutes.   Elevate your legs when you are sitting.  Wear compression stockings as directed by your health care provider. These stockings help keep blood clots from forming in your legs.  Take showers once your health care provider approves. Until then, only take sponge baths. Pat incisions dry. Do not rub incisions with a washcloth or towel. Do not take baths, swim, or use a hot tub until your health care provider approves.  Eat foods that are high in fiber, such as raw fruits and vegetables, whole grains, beans, and nuts. Meats should be lean cut. Avoid canned, processed, and fried foods.  Drink enough fluid to keep your urine clear or pale yellow.  Weigh yourself every day. This helps identify if you are retaining fluid that may make your heart and lungs work harder.  Rest and limit activity as directed by your health care provider. You may be instructed to:  Stop any activity at once if you have chest pain, shortness of breath, irregular heartbeats, or dizziness. Get help right away if you have any of these symptoms.  Move around frequently for short periods or take short walks as directed by your health care provider. Increase your activities gradually. You may need physical therapy or cardiac rehabilitation to help strengthen your muscles and build your endurance.  Avoid lifting, pushing, or pulling anything heavier  than 10 lb (4.5 kg) for at least 6 weeks after surgery.  Do not drive until your health care provider approves.  Ask your health care provider when you may return to work.  Ask your health care provider when you may resume sexual activity.  Keep all follow-up visits as directed by your health care provider. This is important. SEEK MEDICAL  CARE IF:  You have swelling, redness, increasing pain, or drainage at the site of an incision.  You have a fever.  You have swelling in your ankles or legs.  You have pain in your legs.   You gain 2 or more pounds (0.9 kg) a day.  You are nauseous or vomit.  You have diarrhea. SEEK IMMEDIATE MEDICAL CARE IF:  You have chest pain that goes to your jaw or arms.  You have shortness of breath.   You have a fast or irregular heartbeat.   You notice a "clicking" in your breastbone (sternum) when you move.   You have numbness or weakness in your arms or legs.  You feel dizzy or light-headed.  MAKE SURE YOU:  Understand these instructions.  Will watch your condition.  Will get help right away if you are not doing well or get worse. This information is not intended to replace advice given to you by your health care provider. Make sure you discuss any questions you have with your health care provider. Document Released: 03/03/2005 Document Revised: 09/04/2014 Document Reviewed: 01/21/2013 Elsevier Interactive Patient Education  2017 [employer].

## 2016-07-23 NOTE — Progress Notes (Addendum)
      [address]       [place] [address]             [phone]        6 Days Post-Op Procedure(s) (LRB): CORONARY ARTERY BYPASS GRAFTING (CABG)x3 using right greater saphenous vein harvested endoscopiclly and left internal mammary artery (N/A) TRANSESOPHAGEAL ECHOCARDIOGRAM (TEE) (N/A)  Subjective: Patient without specific complaints this am. She hopes to go to a facility tomorrow.  Objective: Vital signs in last 24 hours: Temp:  [98.2 F (36.8 C)-100.9 F (38.3 C)] 98.2 F (36.8 C) (11/26 0429) Pulse Rate:  [67-69] 67 (11/26 0915) Cardiac Rhythm: Normal sinus rhythm (11/26 0700) Resp:  [18-20] 20 (11/26 0915) BP: (114-120)/(53-72) 120/71 (11/26 0429) SpO2:  [90 %-100 %] 100 % (11/26 0915) Weight:  [121 lb 4.1 oz (55 kg)] 121 lb 4.1 oz (55 kg) (11/26 0500)  Pre op weight 58.2 kg Current Weight  07/23/16 121 lb 4.1 oz (55 kg)      Intake/Output from previous day: 11/25 0701 - 11/26 0700 In: 240 [P.O.:240] Out: -    Physical Exam:  Cardiovascular: RRR Pulmonary: Slightly diminished at bases Abdomen: Soft, non tender, bowel sounds present. Extremities: No lower extremity edema. Wounds: Clean and dry.  No erythema or signs of infection.  Lab Results: CBC:  Recent Labs  07/21/16 0242 07/22/16 0943  WBC 10.4 9.1  HGB 9.0* 9.4*  HCT 28.4* 28.9*  PLT 280 337   BMET:   Recent Labs  07/21/16 0242  NA 135  K 4.3  CL 102  CO2 22  GLUCOSE 96  BUN 17  CREATININE 0.98  CALCIUM 8.3*    PT/INR:  Lab Results  Component Value Date   INR 1.54 07/17/2016   INR 1.11 07/15/2016   INR 1.10 07/12/2016   ABG:  INR: Will add last result for INR, ABG once components are confirmed Will add last 4 CBG results once components are confirmed  Assessment/Plan:  1. CV - SR in the 60's. On Amiodarone 200 mg daily, Lisinopril 5 mg daily, Lopressor 12.5 mg bid. Had PAF prior to surgery and was on Eliquis. Likely resume at discharge. 2.   Pulmonary - On 2 liters of oxygen via [place]. She may need home oxygen. Continue Dulera.Encourage incentive spirometer. 3. Volume Overload - On Lasix 40 mg daily. 4.  Acute blood loss anemia - H and H yesterday stable at 9 and 28.4 5. Will need SNF as lives alone, likely Monday.  ZIMMERMAN,DONIELLE MPA-C 07/23/2016,10:46 AM   Chart reviewed, patient examined, agree with above. She is doing well and should be ready for discharge tomorrow if SNF bed available. Resume Eliquis at discharge.

## 2016-07-24 ENCOUNTER — Other Ambulatory Visit: Payer: Self-pay | Admitting: Physician Assistant

## 2016-07-24 MED ORDER — ACETAMINOPHEN 325 MG PO TABS: 650.0000 mg | ORAL_TABLET | Freq: Four times a day (QID) | ORAL | 0 refills | Status: DC | PRN

## 2016-07-24 MED ORDER — BISACODYL 5 MG PO TBEC: 10.0000 mg | DELAYED_RELEASE_TABLET | Freq: Every day | ORAL | 0 refills | Status: DC | PRN

## 2016-07-24 MED ORDER — LEVALBUTEROL HCL 0.63 MG/3ML IN NEBU: 0.6300 mg | INHALATION_SOLUTION | Freq: Four times a day (QID) | RESPIRATORY_TRACT | 12 refills | Status: DC | PRN

## 2016-07-24 MED ORDER — OXYCODONE HCL 5 MG PO TABS: 5.0000 mg | ORAL_TABLET | ORAL | 0 refills | Status: DC | PRN

## 2016-07-24 MED ORDER — POTASSIUM CHLORIDE CRYS ER 20 MEQ PO TBCR: 20.0000 meq | EXTENDED_RELEASE_TABLET | Freq: Every day | ORAL | 1 refills | Status: DC

## 2016-07-24 MED ORDER — DOCUSATE SODIUM 100 MG PO CAPS: 200.0000 mg | ORAL_CAPSULE | Freq: Every day | ORAL | 0 refills | Status: DC

## 2016-07-24 MED ORDER — FUROSEMIDE 40 MG PO TABS: 40.0000 mg | ORAL_TABLET | Freq: Every day | ORAL | 1 refills | Status: DC

## 2016-07-24 MED ORDER — MOMETASONE FURO-FORMOTEROL FUM 100-5 MCG/ACT IN AERO: 2.0000 | INHALATION_SPRAY | Freq: Two times a day (BID) | RESPIRATORY_TRACT | 1 refills | Status: DC

## 2016-07-24 MED ORDER — AMIODARONE HCL 200 MG PO TABS: 200.0000 mg | ORAL_TABLET | Freq: Every day | ORAL | 1 refills | Status: DC

## 2016-07-24 MED ORDER — METOPROLOL TARTRATE 25 MG PO TABS: 12.5000 mg | ORAL_TABLET | Freq: Two times a day (BID) | ORAL | 1 refills | Status: DC

## 2016-07-24 MED ORDER — ASPIRIN 325 MG PO TBEC: 325.0000 mg | DELAYED_RELEASE_TABLET | Freq: Every day | ORAL | 0 refills | Status: DC

## 2016-07-24 MED ORDER — LISINOPRIL 5 MG PO TABS: 5.0000 mg | ORAL_TABLET | Freq: Every day | ORAL | 1 refills | Status: DC

## 2016-07-24 NOTE — Progress Notes (Signed)
07/24/2016 6:44 PM Report called to Elease Hashimoto at [employer]. Kathryne Hitch

## 2016-07-24 NOTE — Progress Notes (Signed)
  Clinical Social Worker facilitated patient discharge including contacting patient family and facility to confirm patient discharge plans.  Clinical information faxed to facility and family agreeable with plan.  CSW arranged ambulance transport via [facility] to [facility] . RN to call report prior to discharge.  Clinical Social Worker will sign off for now as social work intervention is no longer needed. Please consult [place] again if new need arises.  Marrianne Mood, MSW, [employer] [phone]

## 2016-07-24 NOTE — Care Management Important Message (Signed)
Important Message  Patient Details  Name: Mercedes [name] MRN: [id number] Date of Birth: 20-May-1956   Medicare Important Message Given:  Yes    Kannen Moxey Abena 07/24/2016, 11:30 AM

## 2016-07-24 NOTE — Progress Notes (Signed)
[phone] Pt has walked twice already today so did not walk. Education completed with pt who voiced understanding. Discussed sternal precautions, use of IS and flutter valve, ex ed and heart healthy diet. Encouraged pt to watch carbs and sodium. Offered to show discharge video but pt did not want to view. Gave OHS booklet as she stated she lost hers. Discussed CRP 2 and will refer to [place] program. Pt given smoking cessation handout as she stated she has been smoking occasionally. Pt stated will not be a problem to quit cold [place]. Luetta Nutting RN BSN 07/24/2016 9:28 AM

## 2016-07-24 NOTE — Progress Notes (Signed)
SATURATION QUALIFICATIONS: (This note is used to comply with regulatory documentation for home oxygen)  Patient Saturations on Room Air at Rest = 84%  Patient Saturations on 2L at rest=90%  Patient Saturations on 3 Liters of oxygen while Ambulating = 93%  Please briefly explain why patient needs home oxygen: Pt unable to maintain sats >90% on RA and required 2L at rest and 3L with gait  Delaney Meigs, PT [phone]

## 2016-07-24 NOTE — Progress Notes (Signed)
07/24/2016 6:07 PM Pt very upset that transportation has not arrived to take her to the facility.  She is complaining because we have not had her on the monitor ar have been taking her VS.  I explained to pt. That we have to have the pt ready to be transported at the time they give [place].  The time given to [place] originally was 1430.  I did explain to her when we had the initial time of discharge that it may not be at that exact time, sometimes the transportation system gets backed up due to emergencies.  She voiced understanding at the time but now is very frustrated that she is still in the hospital waiting and not being monitored.  Again explained to pt that she has been discharged we are just waiting on [facility], but if she needed anything to please let us know.  Pt is still very upset, told her I will call [facility] and see if I could figure out about when they should be here.  I called [facility] and they stated she is the second on the list know, they had several people in front of her when she was placed on the list.  Will make pt aware. Kathryne Hitch

## 2016-07-24 NOTE — Care Management Note (Signed)
Case Management Note Donn Pierini RN, BSN Unit 2W-Case Manager [phone]  Patient Details  Name: Mercedes [name] MRN: [id number] Date of Birth: 07-20-1956  Subjective/Objective: Pt admitted with NSTEMI- s/p CABG                   Action/Plan: PTA pt lived at home- PT eval with recommendation for SNF-CSW consulted for placement needs  Expected Discharge Date:  07/24/16             Expected Discharge Plan:  Skilled Nursing Facility  In-House Referral:  Clinical Social Work  Discharge planning Services  CM Consult  Post Acute Care Choice:  Durable Medical Equipment Choice offered to:     DME Arranged:  Oxygen DME Agency:     HH Arranged:    HH Agency:     Status of Service:  Completed, signed off  If discussed at [employer] [facility] [employer], dates discussed:    Discharge Disposition: skilled facility   Additional Comments:  07/24/16- 1400- Jaxon Flatt RN, CM- pt for d/c to SNF today-per CSW pt has chosen [name] for rehab. Order placed for home 02- this will need to be reassess at SNF and if pt still needs home 02 arranged at time of discharge for [facility]NF. CSW following for placement needs.   Darrold Span, RN 07/24/2016, 1:54 PM

## 2016-07-24 NOTE — Progress Notes (Signed)
Physical Therapy Treatment Patient Details Name: Mercedes [name] MRN: [id number] DOB: 16-Mar-1956 Today's Date: 07/24/2016    History of Present Illness Patient is a 60 yo female s/p CORONARY ARTERY BYPASS GRAFTING (CABG)x3 using right greater saphenous vein harvested endoscopically and left internal mammary artery     PT Comments    Pt very pleasant and eager to progress mobility to return to independent function. Pt able to state 2/5 precautions, educated for all with handout provided. Pt demonstrated increased ability with sit to stand and gait but continues to require assist to get OOB and maintain precautions. 6 min walk per PA request with pt able to complete 660' with need for 3L O2 to maintain sats at 93% with HR 97. Will continue to follow  RA sats 84% HR 80 2L at rest 93%, dropped to 87% with gait on 2L 3L with gait 93% HR 97  Follow Up Recommendations  SNF;Supervision/Assistance - 24 hour     Equipment Recommendations       Recommendations for Other Services       Precautions / Restrictions Precautions Precautions: Sternal    Mobility  Bed Mobility Overal bed mobility: Needs Assistance Bed Mobility: Rolling;Sidelying to Sit Rolling: Min assist Sidelying to sit: Min assist       General bed mobility comments: cues for sequence, precautions and safety with assist to raise trunk from surface  Transfers Overall transfer level: Needs assistance     Sit to Stand: Supervision         General transfer comment: pt with good placement of hands with tactile cues not to place on chair with sitting  Ambulation/Gait Ambulation/Gait assistance: Supervision Ambulation Distance (Feet): 800 Feet Assistive device: None Gait Pattern/deviations: Decreased stride length;Step-through pattern   Gait velocity interpretation: Below normal speed for age/gender     Stairs Stairs: Yes Stairs assistance: Min guard Stair Management: Step to pattern;Forwards Number of  Stairs: 2    Wheelchair Mobility    Modified Rankin (Stroke Patients Only)       Balance Overall balance assessment: No apparent balance deficits (not formally assessed)                                  Cognition Arousal/Alertness: Awake/alert Behavior During Therapy: WFL for tasks assessed/performed Overall Cognitive Status: Within Functional Limits for tasks assessed       Memory: Decreased recall of precautions              Exercises      General Comments        Pertinent Vitals/Pain Pain Assessment: No/denies pain    Home Living                      Prior Function            PT Goals (current goals can now be found in the care plan section) Progress towards PT goals: Progressing toward goals    Frequency           PT Plan Current plan remains appropriate    Co-evaluation             End of Session Equipment Utilized During Treatment: Gait belt;Oxygen Activity Tolerance: Patient tolerated treatment well Patient left: in chair;with call bell/phone within reach     Time: 0729-0803 PT Time Calculation (min) (ACUTE ONLY): 34 min  Charges:  $Gait Training: 23-37 mins  G Codes:      Enedina Finner Raidyn Breiner 07/24/2016, 9:22 AM Delaney Meigs, PT [phone]

## 2016-07-24 NOTE — Progress Notes (Signed)
07/24/2016 1830 [facility] here to transport pt. to [facility].   All paper work given to transporters. Kathryne Hitch

## 2016-07-24 NOTE — Progress Notes (Addendum)
      [address]       [place] [address]             [phone]      7 Days Post-Op Procedure(s) (LRB): CORONARY ARTERY BYPASS GRAFTING (CABG)x3 using right greater saphenous vein harvested endoscopiclly and left internal mammary artery (N/A) TRANSESOPHAGEAL ECHOCARDIOGRAM (TEE) (N/A) Subjective: She is ready to go to rehab. No issues over the weekend. Still using oxygen.   Objective: Vital signs in last 24 hours: Temp:  [98.1 F (36.7 C)-98.7 F (37.1 C)] 98.1 F (36.7 C) (11/27 0536) Pulse Rate:  [67-77] 77 (11/27 0536) Cardiac Rhythm: Normal sinus rhythm (11/26 1900) Resp:  [18-20] 18 (11/27 0536) BP: (106-133)/(68-76) 128/71 (11/27 0536) SpO2:  [90 %-100 %] 90 % (11/27 0536) Weight:  [119 lb 6.4 oz (54.2 kg)] 119 lb 6.4 oz (54.2 kg) (11/27 0536)     Intake/Output from previous day: 11/26 0701 - 11/27 0700 In: 3 [I.V.:3] Out: -  Intake/Output this shift: No intake/output data recorded.  General appearance: alert, cooperative and no distress Heart: regular rate and rhythm Lungs: clear to auscultation bilaterally Extremities: extremities normal, atraumatic, no cyanosis or edema Wound: clean and dry  Lab Results:  Recent Labs  07/22/16 0943  WBC 9.1  HGB 9.4*  HCT 28.9*  PLT 337   BMET: No results for input(s): NA, K, CL, CO2, GLUCOSE, BUN, CREATININE, CALCIUM in the last 72 hours.  PT/INR: No results for input(s): LABPROT, INR in the last 72 hours. ABG    Component Value Date/Time   PHART 7.384 07/17/2016 2247   HCO3 24.1 07/17/2016 2247   TCO2 25 07/18/2016 1657   ACIDBASEDEF 1.0 07/17/2016 2247   O2SAT 92.0 07/17/2016 2247   CBG (last 3)  No results for input(s): GLUCAP in the last 72 hours.  Assessment/Plan: S/P Procedure(s) (LRB): CORONARY ARTERY BYPASS GRAFTING (CABG)x3 using right greater saphenous vein harvested endoscopiclly and left internal mammary artery (N/A) TRANSESOPHAGEAL ECHOCARDIOGRAM (TEE) (N/A)  1. CV - SR  in the 70's. On Amiodarone 200 mg daily, Lisinopril 5 mg daily, Lopressor 12.5 mg bid. Had PAF prior to surgery and was on Eliquis. Likely resume at discharge. 2.  Pulmonary - On 2 liters of oxygen via [place]. She may need home oxygen. Continue Dulera.Encourage incentive spirometer. 3. Volume Overload - On Lasix 40 mg daily. 4.  Acute blood loss anemia - H and H stable  5. Will need SNF as lives alone, likely today.     LOS: 13 days    Sharlene Dory 07/24/2016  patient examined and medical record reviewed,agree with above note. Kathlee Nations Trigt III 07/24/2016

## 2016-07-25 ENCOUNTER — Telehealth: Payer: Self-pay | Admitting: Student

## 2016-07-25 ENCOUNTER — Other Ambulatory Visit: Payer: Self-pay | Admitting: Physician Assistant

## 2016-07-25 NOTE — Telephone Encounter (Signed)
New message      Calling to let Mercedes [name] know that the pt was discharged from the hosp 07-24-16 to skilled nursing care at [facility].  Also, calling to ask [place] to refer pt to a PCP for follow up care.

## 2016-07-25 NOTE — Telephone Encounter (Signed)
The patient does not need a referral for a PCP. She should be able to establish care with any practice which is accepting new patients. It would be beneficial for her to make sure they accept her insurance. [employer] generally accepts [employer].   While [facility] is [facility], they should have a rounding provider at the facility. She will need to be seen by a PCP after she is discharged from SNF.

## 2016-07-25 NOTE — Consult Note (Signed)
            [facility] CM Primary Care Navigator        [facility] Primary Care Navigator  07/25/2016  Mercedes [name] 1955/09/06 [id number]   [place] see patient at the bedside to identify possible discharge needs butstaff states that patient was already discharged.  Noted that patient has no primary care provider listed and Inpatient CM states that patient has a follow-up appointment scheduled with primary cardiology [name]) on 08/03/16.  Contacted [name]'s office and spoke with [name] and notified of patient's discharge to skilled nursing facility ([name]) with request to notify [name] that patient would need a primary care provider to follow-up with, if she can facilitate a referral for one.  Stated she will relay the message.   Contact information provided as requested.  For additional questions please contact:  [name], BSN, RN-BC [facility] PRIMARY CARE Navigator Cell: [phone]

## 2016-08-01 ENCOUNTER — Other Ambulatory Visit: Payer: Self-pay | Admitting: *Deleted

## 2016-08-01 ENCOUNTER — Encounter (HOSPITAL_COMMUNITY): Payer: Self-pay | Admitting: Emergency Medicine

## 2016-08-01 ENCOUNTER — Emergency Department (HOSPITAL_COMMUNITY): Payer: Medicare Other

## 2016-08-01 ENCOUNTER — Emergency Department (HOSPITAL_COMMUNITY)
Admission: EM | Admit: 2016-08-01 | Discharge: 2016-08-01 | Disposition: A | Discharge status: Home / Self Care | Payer: Medicare Other | Attending: Emergency Medicine | Admitting: Emergency Medicine

## 2016-08-01 DIAGNOSIS — R0689 Other abnormalities of breathing: Secondary | ICD-10-CM | POA: Diagnosis not present

## 2016-08-01 DIAGNOSIS — I10 Essential (primary) hypertension: Secondary | ICD-10-CM | POA: Diagnosis not present

## 2016-08-01 DIAGNOSIS — R0789 Other chest pain: Secondary | ICD-10-CM | POA: Diagnosis present

## 2016-08-01 DIAGNOSIS — Z7982 Long term (current) use of aspirin: Secondary | ICD-10-CM | POA: Insufficient documentation

## 2016-08-01 DIAGNOSIS — I252 Old myocardial infarction: Secondary | ICD-10-CM | POA: Diagnosis not present

## 2016-08-01 DIAGNOSIS — Z951 Presence of aortocoronary bypass graft: Secondary | ICD-10-CM | POA: Diagnosis not present

## 2016-08-01 DIAGNOSIS — Z950 Presence of cardiac pacemaker: Secondary | ICD-10-CM | POA: Insufficient documentation

## 2016-08-01 DIAGNOSIS — Z7901 Long term (current) use of anticoagulants: Secondary | ICD-10-CM | POA: Diagnosis not present

## 2016-08-01 DIAGNOSIS — M546 Pain in thoracic spine: Secondary | ICD-10-CM | POA: Diagnosis not present

## 2016-08-01 DIAGNOSIS — Z955 Presence of coronary angioplasty implant and graft: Secondary | ICD-10-CM | POA: Diagnosis not present

## 2016-08-01 DIAGNOSIS — I2510 Atherosclerotic heart disease of native coronary artery without angina pectoris: Secondary | ICD-10-CM | POA: Insufficient documentation

## 2016-08-01 DIAGNOSIS — G4489 Other headache syndrome: Secondary | ICD-10-CM | POA: Diagnosis not present

## 2016-08-01 DIAGNOSIS — Z87891 Personal history of nicotine dependence: Secondary | ICD-10-CM | POA: Insufficient documentation

## 2016-08-01 LAB — CBC
HCT: 35.4 % — ABNORMAL LOW (ref 36.0–46.0)
Hemoglobin: 11.1 g/dL — ABNORMAL LOW (ref 12.0–15.0)
MCH: 24.7 pg — AB (ref 26.0–34.0)
MCHC: 31.4 g/dL (ref 30.0–36.0)
MCV: 78.7 fL (ref 78.0–100.0)
PLATELETS: 734 10*3/uL — AB (ref 150–400)
RBC: 4.5 MIL/uL (ref 3.87–5.11)
RDW: 15.2 % (ref 11.5–15.5)
WBC: 6.5 10*3/uL (ref 4.0–10.5)

## 2016-08-01 LAB — I-STAT TROPONIN, ED
Troponin i, poc: 0.05 ng/mL (ref 0.00–0.08)
Troponin i, poc: 0.03 ng/mL (ref 0.00–0.08)

## 2016-08-01 LAB — COMPREHENSIVE METABOLIC PANEL
ALT: 31 U/L (ref 14–54)
AST: 37 U/L (ref 15–41)
Albumin: 3 g/dL — ABNORMAL LOW (ref 3.5–5.0)
Alkaline Phosphatase: 89 U/L (ref 38–126)
Anion gap: 13 (ref 5–15)
BUN: 14 mg/dL (ref 6–20)
CHLORIDE: 101 mmol/L (ref 101–111)
CO2: 21 mmol/L — ABNORMAL LOW (ref 22–32)
CREATININE: 0.96 mg/dL (ref 0.44–1.00)
Calcium: 9.5 mg/dL (ref 8.9–10.3)
Glucose, Bld: 101 mg/dL — ABNORMAL HIGH (ref 65–99)
POTASSIUM: 4.7 mmol/L (ref 3.5–5.1)
Sodium: 135 mmol/L (ref 135–145)
TOTAL PROTEIN: 8.1 g/dL (ref 6.5–8.1)
Total Bilirubin: 0.7 mg/dL (ref 0.3–1.2)

## 2016-08-01 LAB — PROTIME-INR
INR: 1.38
PROTHROMBIN TIME: 17.1 s — AB (ref 11.4–15.2)

## 2016-08-01 LAB — BRAIN NATRIURETIC PEPTIDE: B NATRIURETIC PEPTIDE 5: 115.9 pg/mL — AB (ref 0.0–100.0)

## 2016-08-01 NOTE — ED Notes (Signed)
Called to room with c/o vomiting. Pt is not vomiting but spits up clear phlegm with coughing. Dr. Nancy Marus aware.

## 2016-08-01 NOTE — ED Provider Notes (Signed)
[facility]-EMERGENCY DEPT Provider Note   CSN: [id number] Arrival date & time: 08/01/16  1000   History   Chief Complaint Chief Complaint  Patient presents with  . Chest Pain    HPI Mercedes [name] is a 60 y.o. female with a history of NSTEMI and CABG x 3 on 07/17/16, hx of previous NSTEMI in 2010 and STEMI in 2014, PAD, HTN, HLD, pAF on Eliquis presenting to the ED with "burning" of her incision, upper middle back pain, and headache.  Incisional Pain: Pt states she woke up from sleep with "burning pain" underneath her incision. The burning pain is constant. She denies any crushing chest pian. She states "I know what real chest pain feels like and this is not the same thing". She has not tried anything for the incisional pain. She denies any associated nausea or shortness of breath.  Upper Middle Back Pain: She states she woke up from sleep with upper middle back pain. She states "it feels like I am laying on an uncomfortable ball". She describes the pain as "hurts". She rates the pain as a 6-7/10. The pain is "constant". The pain is worse with movement. She has not taken anything for the pain.  Headache: Occurring over left eye. Feels like a "sharp pressure". Rates the pain as a 6-7/10. No changes in vision. No eye pain. No photophobia. No nausea or vomiting. She has had a runny nose for the last few days. No headaches that wake her up from sleep.   HPI  Past Medical History:  Diagnosis Date  . Abnormal LFTs    08/2013 Lipitor 40 d/c'd and crestor 10 started   . Anemia    as a young woman  . Anxiety   . Arnold-Chiari malformation, type I (HCC)    MRI brain 02/2010  . CAD (coronary artery disease)    a. NSTEMI 07/2009: LHC - D2 40%, LAD irreg., EF 50% with apical AK (tako-tsubo CM);  b. Inf STEMI (04/2013): Tx Promus DES to CFX;  c. 08/2013 Lexi CL: No ischemia, dist ant/ap/inf-ap infarct, EF   . DEPRESSION   . Dizziness    ? CVA 01/2010 - carotid dopplers with no ICA stenosis  . GERD  (gastroesophageal reflux disease)   . HYPERLIPIDEMIA   . HYPERTENSION   . MI (myocardial infarction)   . PAD (peripheral artery disease) (HCC)    s/p L fem pop 11/2013 in setting of 1st toe osteo/gangrene  . Paroxysmal atrial fibrillation (HCC)    a. anticoagulated with Eliquis 10/2014  . RA (rheumatoid arthritis) (HCC)    prior tx by Dr. Dareen Piano  . Sjogren's syndrome (HCC) 06/02/13   pt denies this (12/01/13)  . SLE (systemic lupus erythematosus) (HCC) dx 05/2013   follows with rheum Dareen Piano)  . Tachy-brady syndrome (HCC)    a. s/p STJ Leadless pacemaker 11-04-2014 Dr Johney Frame  . 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 Coronary artery disease involving native heart without angina pectoris   . Hx of CABG 07/17/2016  . Paroxysmal atrial fibrillation (HCC) 07/12/2016  . Chest pain 07/11/2016  . NSTEMI (non-ST elevated myocardial infarction) (HCC) 07/11/2016  . History of arterial bypass of lower extremity 12/30/2015  . Aftercare following surgery of the circulatory system 12/30/2015  . Claudication of right lower extremity (HCC) 12/30/2015  . COPD exacerbation (HCC) 10/04/2015  . Phantom  limb pain-Left great toe 04/29/2015  . Seizure (HCC)   . Atrial flutter with rapid ventricular response vs. SVT 10/27/2014    Class: Acute  . Noncompliance with medication regimen 10/27/2014  . 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 PAD (peripheral artery disease) (HCC) 12/02/2013  . Unstable angina-jaw pain 09/03/2013  . Abnormal LFTs on Lipitor- changed to Crestor but pt not compliant 09/03/2013  . SLE (systemic lupus erythematosus) (HCC)   . CAD - S/P Takotsubo MI in 2000, CFX DES Sept 2014 05/25/2013  . Acute MI, inferior wall (HCC) 05/19/2013  . 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 Tachycardia-bradycardia (HCC)  03/09/2010  . DEPRESSION 11/02/2009  . PVD- s/p Lt [facility] April 2015 10/29/2009  . TOBACCO ABUSE 09/17/2009  . Dyslipidemia 08/26/2009  . Essential hypertension 08/26/2009  . CORONARY ATHEROSCLEROSIS NATIVE CORONARY ARTERY 08/26/2009  . 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 GERD 08/26/2009  . Rheumatoid arthritis (HCC) 08/26/2009    Past Surgical History:  Procedure Laterality Date  . ABDOMINAL AORTAGRAM N/A 11/24/2013   Procedure: ABDOMINAL AORTAGRAM WITH BILATERAL RUNOFF WITH POSSIBLE INTERVENTION;  Surgeon: Chuck Hint, MD;  Location: [facility] CATH LAB;  Service: Cardiovascular;  Laterality: N/A;  . AMPUTATION Left 12/02/2013   Procedure: LEFT 1ST TOE AMPUTATION;  Surgeon: Sherren Kerns, MD;  Location: [facility] OR;  Service: Vascular;  Laterality: Left;  . CARDIAC CATHETERIZATION N/A 07/12/2016   Procedure: Left Heart Cath and Coronary Angiography;  Surgeon: Lennette Bihari, MD;  Location: [facility] INVASIVE CV LAB;  Service: Cardiovascular;  Laterality: N/A;  . CARDIAC CATHETERIZATION N/A 07/14/2016   Procedure: Intravascular Ultrasound/IVUS;  Surgeon: Yvonne Kendall, MD;  Location: [facility] INVASIVE CV LAB;  Service: Cardiovascular;  Laterality: N/A;  . COLONOSCOPY    . CORONARY ANGIOPLASTY  04/2013  . CORONARY ARTERY BYPASS GRAFT N/A 07/17/2016   Procedure: CORONARY ARTERY BYPASS GRAFTING (CABG)x3 using right greater saphenous vein harvested endoscopiclly and left internal mammary artery;  Surgeon: Kerin Perna, MD;  Location: [facility] OR;  Service: Open Heart Surgery;  Laterality: N/A;  . FEMORAL-POPLITEAL BYPASS GRAFT Left 12/02/2013   Procedure:  LEFT FEMORAL-BELOW KNEE POPLITEAL ARTERY BYPASS GRAFT;  Surgeon: Sherren Kerns, MD;  Location: [facility] OR;  Service: Vascular;  Laterality: Left;  . LEFT HEART CATHETERIZATION WITH CORONARY ANGIOGRAM N/A 05/19/2013   Procedure: LEFT HEART CATHETERIZATION WITH CORONARY ANGIOGRAM;  Surgeon: Micheline Chapman, MD;  Location: [facility] CATH LAB;  Service:  Cardiovascular;  Laterality: N/A;  . LEFT HEART CATHETERIZATION WITH CORONARY ANGIOGRAM N/A 10/28/2014   Procedure: LEFT HEART CATHETERIZATION WITH CORONARY ANGIOGRAM;  Surgeon: Iran Ouch, MD;  Location: [facility] CATH LAB;  Service: Cardiovascular;  Laterality: N/A;  . PERCUTANEOUS CORONARY STENT INTERVENTION (PCI-S)  05/19/2013   Procedure: PERCUTANEOUS CORONARY STENT INTERVENTION (PCI-S);  Surgeon: Micheline Chapman, MD;  Location: [facility] CATH LAB;  Service: Cardiovascular;;  . PERMANENT PACEMAKER INSERTION N/A 11/04/2014   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  . TEE WITHOUT CARDIOVERSION N/A 07/17/2016   Procedure: TRANSESOPHAGEAL ECHOCARDIOGRAM (TEE);  Surgeon: Kerin Perna, MD;  Location[facility] OR;  Service: Open Heart Surgery;  Laterality: N/A;  . TUBAL LIGATION      OB History    No data available       Home Medications    Prior to Admission medications   Medication Sig Start Date End Date Taking? Authorizing Provider  amiodarone (PACERONE) 200 MG tablet Take 1 tablet (200 mg total) by mouth  daily. 07/24/16  Yes Sharlene Dory, PA-C  apixaban (ELIQUIS) 5 MG TABS tablet Take 1 tablet (5 mg total) by mouth 2 (two) times daily. Patient taking differently: Take 5 mg by mouth daily.  08/13/15  Yes Lewayne Bunting, MD  aspirin EC 81 MG tablet Take 1 tablet (81 mg total) by mouth daily. 01/27/15  Yes Amber Caryl Bis, NP  docusate sodium (COLACE) 100 MG capsule Take 2 capsules (200 mg total) by mouth daily. 07/24/16  Yes Tessa N Conte, PA-C  ENSURE (ENSURE) Take 237 mLs by mouth daily.   Yes Historical Provider, MD  furosemide (LASIX) 40 MG tablet Take 1 tablet (40 mg total) by mouth daily. 07/24/16  Yes Sharlene Dory, PA-C  lisinopril (PRINIVIL,ZESTRIL) 5 MG tablet Take 1 tablet (5 mg total) by mouth daily. 07/24/16  Yes Sharlene Dory, PA-C  metoprolol tartrate (LOPRESSOR) 25 MG tablet Take 0.5 tablets (12.5 mg total) by mouth 2 (two) times daily. 07/24/16  Yes Sharlene Dory, PA-C  Nutritional Supplements (NUTRITIONAL DRINK PO) Take 120 mLs by mouth 2 (two) times daily. medpass 2.0   Yes Historical Provider, MD  oxyCODONE (OXY IR/ROXICODONE) 5 MG immediate release tablet Take 1 tablet (5 mg total) by mouth every 4 (four) hours as needed for severe pain. 07/24/16  Yes Sharlene Dory, PA-C  potassium chloride SA (K-DUR,KLOR-CON) 20 MEQ tablet Take 1 tablet (20 mEq total) by mouth daily. 07/24/16  Yes Sharlene Dory, PA-C  rosuvastatin (CRESTOR) 40 MG tablet Take 1 tablet (40 mg total) by mouth daily. 06/29/15  Yes Lewayne Bunting, MD  acetaminophen (TYLENOL) 325 MG tablet Take 2 tablets (650 mg total) by mouth every 6 (six) hours as needed for mild pain. 07/24/16   Sharlene Dory, PA-C  bisacodyl (DULCOLAX) 5 MG EC tablet Take 2 tablets (10 mg total) by mouth daily as needed for moderate constipation (Give daily if no BM). 07/24/16   Sharlene Dory, PA-C  levalbuterol (XOPENEX) 0.63 MG/3ML nebulizer solution Take 3 mLs (0.63 mg total) by nebulization every 6 (six) hours as needed for wheezing or shortness of breath. 07/24/16   Sharlene Dory, PA-C  mometasone-formoterol (DULERA) 100-5 MCG/ACT AERO Inhale 2 puffs into the lungs 2 (two) times daily. 07/24/16   Sharlene Dory, PA-C    Family History Family History  Problem Relation Age of Onset  . Arthritis Mother   . Emphysema Mother   . Arthritis Father   . COPD Father   . Diabetes Other     Social History Social History  Substance Use Topics  . Smoking status: Former Smoker    Types: Cigarettes    Quit date: 05/19/2013  . Smokeless tobacco: Never Used     Comment:    . Alcohol use 0.0 oz/week     Allergies   Brilinta [ticagrelor]   Review of Systems Review of Systems 10 Systems reviewed and are negative for acute change except as noted in the HPI.  Physical Exam Updated Vital Signs BP 122/74 (BP Location: Right Arm)   Pulse 76   Temp 98.3 F (36.8 C) (Oral)   Resp 16   SpO2 98%   Physical  Exam  Constitutional: She is oriented to person, place, and time. No distress.  HENT:  Head: Normocephalic and atraumatic.  Nose: Nose normal.  Mouth/Throat: No oropharyngeal exudate.  Nasal turbinates erythematous and edematous, mild tenderness to palpation of the frontal sinuses  Eyes: Conjunctivae and EOM are normal.  Pupils are equal, round, and reactive to light. No scleral icterus.  Neck: Normal range of motion. Neck supple.  Cardiovascular: Normal rate and regular rhythm.   No murmur heard. Pulmonary/Chest: Effort normal and breath sounds normal. No respiratory distress. She has no wheezes.  Diffuse tenderness to palpation of her anterior chest wall  Abdominal: Soft. Bowel sounds are normal. She exhibits no distension and no mass. There is no tenderness. There is no guarding.  Musculoskeletal: Normal range of motion. She exhibits no edema.  Paraspinal muscles of the thoracic spine are tender to palpation  Lymphadenopathy:    She has no cervical adenopathy.  Neurological: She is alert and oriented to person, place, and time. She has normal strength. No sensory deficit. She exhibits normal muscle tone.  Moves all extremities spontaneously  Skin: Skin is warm and dry. She is not diaphoretic.  Psychiatric: She has a normal mood and affect. Her behavior is normal.     ED Treatments / Results  Labs (all labs ordered are listed, but only abnormal results are displayed) Labs Reviewed  APTT  CBC  COMPREHENSIVE METABOLIC PANEL  BRAIN NATRIURETIC PEPTIDE  PROTIME-INR  I-STAT TROPOININ, ED  I-STAT TROPOININ, ED    EKG  EKG Interpretation  Date/Time:  Tuesday August 01 2016 10:12:51 EST Ventricular Rate:  70 PR Interval:    QRS Duration: 110 QT Interval:  437 QTC Calculation: 472 R Axis:   -44 Text Interpretation:  Sinus rhythm Left axis deviation Low voltage, precordial leads RSR' in V1 or V2, right VCD or RVH Nonspecific T abnormalities, anterior leads New since previous  tracing Confirmed by KNOTT MD, DANIEL [phone]) on 08/01/2016 11:03:06 AM       Radiology Dg Chest Portable 1 View  Result Date: 08/01/2016 CLINICAL DATA:  Chest pain. EXAM: PORTABLE CHEST 1 VIEW COMPARISON:  07/22/2016. FINDINGS: Prior CABG. Cardiac monitoring device noted. Heart size stable. Interval partial clearing of bilateral from interstitial prominence consist with partial clearing of pulmonary edema. Previously identified right pleural effusion has cleared. Partial clearing of left pleural effusion. No pneumothorax. IMPRESSION: Prior CABG.Partial clearing of pulmonary interstitial edema. Right pleural effusion has cleared. Left pleural effusion has partially cleared. Electronically Signed   By: Maisie Fus  Register   On: 08/01/2016 11:58    Procedures Procedures (including critical care time)  Medications Ordered in ED Medications - No data to display   Initial Impression / Assessment and Plan / ED Course  I have reviewed the triage vital signs and the nursing notes.  Pertinent labs that were available during my care of the patient were reviewed by me and considered in my medical decision making (see chart for details).  Clinical Course    Mercedes [name] is a 60 year old female presenting to the ED from SNF with chest pain, upper middle back pain, and headache that started this morning. Concern for cardiac etiology given her recent CABG x3 15 days ago. However, her pain is very atypical and sounds more like incisional pain and anterior chest wall pain. Her sternum is tender to palpation. Initial trop 0.05. EKG showed new T-wave inversions in V1-V3. Discussed with Dr. Delton See (Cardiology) who agreed with our plan to repeat a troponin after three hours. Dr. Delton See felt she was safe to return to SNF if troponin was not increasing.  3:30PM: Repeat troponin was 0.03, from 0.05 previously. Think she is safe for discharge back to SNF. Advised Pt that she should use Tylenol for her back pain and  headache.  Final Clinical Impressions(s) / ED Diagnoses   Final diagnoses:  Chest wall pain  Other headache syndrome  Midline thoracic back pain, unspecified chronicity    New Prescriptions Current Discharge Medication List       Campbell Stall, MD 08/01/16 1554    Lyndal Pulley, MD 08/01/16 1921

## 2016-08-01 NOTE — ED Notes (Signed)
Report given to nurse at [email or web address]

## 2016-08-01 NOTE — Patient Outreach (Signed)
[facility]) Care Management  08/01/2016  Mercedes [name] May 02, 1956 [id number]   Attempted to visit with patient at Skilled Nursing Facility, notified by staff that patient went to hospital. Unsure if she will be admitted. Plan to check on patient again next week if she has returned.  Alben Spittle. Niemczura, RN, BSN, CCM [facility] post Acute Care Coordinator [phone]

## 2016-08-01 NOTE — ED Notes (Signed)
Labs reviewed by [place], [place] and cleared for DC.

## 2016-08-01 NOTE — ED Notes (Signed)
Family arrives and states they will not transport Pt back to NHG. [facility] contacted and state to call [facility]. Same done.

## 2016-08-01 NOTE — Discharge Instructions (Signed)
You came to the ED because you were having chest pain, back pain, and headache. We do not think your chest pain is related to your heart. We did some labs and an EKG that showed that your heart looked normal.  For your back pain and headache, you can use Tylenol as needed. You may be developing a virus. Please make sure you drink plenty of fluids.

## 2016-08-01 NOTE — ED Triage Notes (Signed)
Per EMS: pt from SNF at [facility] for rehab for bypass sx; pt sts mid sternal CP 1 hour ago; pt given 324mg  ASA; IV 20g L AC; pt decreasing at present

## 2016-08-01 NOTE — ED Notes (Signed)
Placed the patient on a bedpan.  Then removed the pt from the bedpan and performed pericare.  Urine sample saved by the bedside.

## 2016-08-01 NOTE — ED Notes (Signed)
Iv at left [facility] remioved. Site clear, cath intact.

## 2016-08-01 NOTE — ED Notes (Signed)
EKG given to Dr. Knott 

## 2016-08-03 ENCOUNTER — Ambulatory Visit (INDEPENDENT_AMBULATORY_CARE_PROVIDER_SITE_OTHER): Payer: Medicare Other | Admitting: Student

## 2016-08-03 ENCOUNTER — Encounter: Payer: Self-pay | Admitting: Student

## 2016-08-03 ENCOUNTER — Other Ambulatory Visit: Payer: Self-pay | Admitting: Student

## 2016-08-03 VITALS — BP 118/77 | HR 64 | Ht 66.0 in | Wt 115.4 lb

## 2016-08-03 DIAGNOSIS — I214 Non-ST elevation (NSTEMI) myocardial infarction: Secondary | ICD-10-CM | POA: Diagnosis not present

## 2016-08-03 DIAGNOSIS — Z951 Presence of aortocoronary bypass graft: Secondary | ICD-10-CM | POA: Diagnosis not present

## 2016-08-03 DIAGNOSIS — I10 Essential (primary) hypertension: Secondary | ICD-10-CM

## 2016-08-03 DIAGNOSIS — E785 Hyperlipidemia, unspecified: Secondary | ICD-10-CM | POA: Diagnosis not present

## 2016-08-03 DIAGNOSIS — I4892 Unspecified atrial flutter: Secondary | ICD-10-CM

## 2016-08-03 DIAGNOSIS — I495 Sick sinus syndrome: Secondary | ICD-10-CM

## 2016-08-03 DIAGNOSIS — Z79899 Other long term (current) drug therapy: Secondary | ICD-10-CM | POA: Diagnosis not present

## 2016-08-03 DIAGNOSIS — I739 Peripheral vascular disease, unspecified: Secondary | ICD-10-CM

## 2016-08-03 LAB — BASIC METABOLIC PANEL
BUN: 20 mg/dL (ref 7–25)
CHLORIDE: 100 mmol/L (ref 98–110)
CO2: 24 mmol/L (ref 20–31)
Calcium: 9.3 mg/dL (ref 8.6–10.4)
Creat: 1.13 mg/dL — ABNORMAL HIGH (ref 0.50–0.99)
GLUCOSE: 106 mg/dL — AB (ref 65–99)
Potassium: 4.9 mmol/L (ref 3.5–5.3)
SODIUM: 133 mmol/L — AB (ref 135–146)

## 2016-08-03 LAB — CBC
HCT: 34.1 % — ABNORMAL LOW (ref 35.0–45.0)
HEMOGLOBIN: 10.5 g/dL — AB (ref 11.7–15.5)
MCH: 24.1 pg — ABNORMAL LOW (ref 27.0–33.0)
MCHC: 30.8 g/dL — ABNORMAL LOW (ref 32.0–36.0)
MCV: 78.4 fL — ABNORMAL LOW (ref 80.0–100.0)
MPV: 7.8 fL (ref 7.5–12.5)
PLATELETS: 781 10*3/uL — AB (ref 140–400)
RBC: 4.35 MIL/uL (ref 3.80–5.10)
RDW: 14.8 % (ref 11.0–15.0)
WBC: 5.9 10*3/uL (ref 3.8–10.8)

## 2016-08-03 MED ORDER — LISINOPRIL 5 MG PO TABS: 5.0000 mg | ORAL_TABLET | Freq: Every day | ORAL | 2 refills | Status: DC

## 2016-08-03 MED ORDER — APIXABAN 5 MG PO TABS: 5.0000 mg | ORAL_TABLET | Freq: Two times a day (BID) | ORAL | 2 refills | Status: DC

## 2016-08-03 MED ORDER — AMIODARONE HCL 200 MG PO TABS: 200.0000 mg | ORAL_TABLET | Freq: Every day | ORAL | 0 refills | Status: DC

## 2016-08-03 MED ORDER — METOPROLOL TARTRATE 25 MG PO TABS: 12.5000 mg | ORAL_TABLET | Freq: Two times a day (BID) | ORAL | 2 refills | Status: DC

## 2016-08-03 MED ORDER — OXYCODONE HCL 5 MG PO TABS: 5.0000 mg | ORAL_TABLET | ORAL | 0 refills | Status: DC | PRN

## 2016-08-03 MED ORDER — ROSUVASTATIN CALCIUM 40 MG PO TABS: 40.0000 mg | ORAL_TABLET | Freq: Every day | ORAL | 2 refills | Status: DC

## 2016-08-03 NOTE — Progress Notes (Signed)
Cardiology Office Note    Date:  08/03/2016   ID:  Mercedes [name], DOB September 27, 1955, MRN [id number]  PCP:  No PCP Per Patient  Cardiologist: Dr. Jens Som   Chief Complaint  Patient presents with  . Hospitalization Follow-up    s/p CABG    History of Present Illness:    Mercedes [name] is a 60 y.o. 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  Was recently admitted from 07/11/2016 - 07/24/2016 for an NSTEMI (troponin peak of 4.10) and subsequent CABG. Initial cath showed a widely patent LCx stent but there was evidence of focal thrombus at the distal LM and ostial LAD. She was continued on Aggrastat and a relook cath was performed on 11/17 which showed 40-50% distal LMCA haziness and 70-80% ostial LAD stenosis. CT Surgery was consulted and she went for CABG on 07/17/2016 with LIMA-LAD, SVG-OM, and SVG-dRCA). She had a mild post-operative anemia (Hgb 9.4 at time of discharge) and did not require transfusions during admission. She was started on Lopressor 12.5mg  BID, Lisinopril 5mg  daily, Amiodarone 200mg  daily (for prophylaxis), and Lasix 40mg  daily. Was continued on Eliquis for anticoagulation with her history of PAF. She was discharged to SNF for rehab as recommended by PT/OT.   Was seen in the ER on 08/01/2016 for chest pain which she described as a burning sensation under her incision. EKG showed TWI in the anterior leads which was similar to previous tracings.  Pain was thought to be more related to her incision as troponin values were flat at 0.05 and 0.03.  She has been at [employer] since discharge and is anticipating going home tomorrow, which she is very excited about. Unsure what medications she is currently taking, as they "bring her a cup of pills in the morning and at night". They did not send a  medication list with her today. She reports overall doing well since hospitalization. She does continue to have incisional pain along her sternum. Says this is present at rest and with exertion, denying any changes with exertion. No drainage from her incisions. She still has her chest tube sutures in place (planning to have these removed tomorrow). Has been working with physical therapy multiple times per day and walks regularly, saying she is walking several hundred feet at a time without symptoms.  Denies any palpitations. No evidence of active bleeding. No orthopnea, PND, or lower extremity edema.   Past Medical History:  Diagnosis Date  . Abnormal LFTs    a. in the past when on Lipitor  . Anemia    as a young woman  . Anxiety   . Arnold-Chiari malformation, type I (HCC)    MRI brain 02/2010  . CAD (coronary artery disease)    a. NSTEMI 07/2009: LHC - D2 40%, LAD irreg., EF 50% with apical AK (tako-tsubo CM);  b. Inf STEMI (04/2013): Tx Promus DES to CFX;  c. 08/2013 Lexi CL: No ischemia, dist ant/ap/inf-ap infarct, EF  d. 06/2016 NSTEMI with LM disease: s/p CABG on 07/17/2016 w/ LIMA-LAD, SVG-OM, and SVG-dRCA.  [name] DEPRESSION   . Dizziness    ? CVA 01/2010 - carotid dopplers with no ICA stenosis  . GERD (gastroesophageal reflux disease)   . HYPERLIPIDEMIA   . HYPERTENSION   . MI (myocardial infarction)   . PAD (peripheral artery  disease) (HCC)    s/p L fem pop 11/2013 in setting of 1st toe osteo/gangrene  . Paroxysmal atrial fibrillation (HCC)    a. anticoagulated with Eliquis 10/2014  . RA (rheumatoid arthritis) (HCC)    prior tx by Dr. Dareen Piano  . Sjogren's syndrome (HCC) 06/02/13   pt denies this (12/01/13)  . SLE (systemic lupus erythematosus) (HCC) dx 05/2013   follows with rheum Dareen Piano)  . Tachy-brady syndrome (HCC)    a. s/p STJ Leadless pacemaker 11-04-2014 Dr Johney Frame  . 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 ABDOMINAL AORTAGRAM N/A 11/24/2013   Procedure: ABDOMINAL AORTAGRAM WITH BILATERAL RUNOFF WITH POSSIBLE INTERVENTION;  Surgeon: Chuck Hint, MD;  Location: [facility] CATH LAB;  Service: Cardiovascular;  Laterality: N/A;  . AMPUTATION Left 12/02/2013   Procedure: LEFT 1ST TOE AMPUTATION;  Surgeon: Sherren Kerns, MD;  Location: [facility] OR;  Service: Vascular;  Laterality: Left;  . CARDIAC CATHETERIZATION N/A 07/12/2016   Procedure: Left Heart Cath and Coronary Angiography;  Surgeon: Lennette Bihari, MD;  Location[facility] INVASIVE CV LAB;  Service: Cardiovascular;  Laterality: N/A;  . CARDIAC CATHETERIZATION N/A 07/14/2016   Procedure: Intravascular Ultrasound/IVUS;  Surgeon: Yvonne Kendall, MD;  Location: [facility] INVASIVE CV LAB;  Service: Cardiovascular;  Laterality: N/A;  . COLONOSCOPY    . CORONARY ANGIOPLASTY  04/2013  . CORONARY ARTERY BYPASS GRAFT N/A 07/17/2016   Procedure: CORONARY ARTERY BYPASS GRAFTING (CABG)x3 using right greater saphenous vein harvested endoscopiclly and left internal mammary artery;  Surgeon: Kerin Perna, MD;  Location: [facility] OR;  Service: Open Heart Surgery;  Laterality: N/A;  . FEMORAL-POPLITEAL BYPASS GRAFT Left 12/02/2013   Procedure:  LEFT FEMORAL-BELOW KNEE POPLITEAL ARTERY BYPASS GRAFT;  Surgeon: Sherren Kerns, MD;  Location: [facility] OR;  Service: Vascular;  Laterality: Left;  . LEFT HEART CATHETERIZATION WITH CORONARY ANGIOGRAM N/A 05/19/2013   Procedure: LEFT HEART CATHETERIZATION WITH CORONARY ANGIOGRAM;  Surgeon: Micheline Chapman, MD;  Location: [facility] CATH LAB;  Service: Cardiovascular;  Laterality: N/A;  . LEFT HEART CATHETERIZATION WITH CORONARY ANGIOGRAM N/A 10/28/2014   Procedure: LEFT HEART CATHETERIZATION WITH CORONARY ANGIOGRAM;  Surgeon: Iran Ouch, MD;  Location: [facility] CATH LAB;  Service: Cardiovascular;  Laterality: N/A;  . PERCUTANEOUS CORONARY STENT INTERVENTION (PCI-S)  05/19/2013   Procedure:  PERCUTANEOUS CORONARY STENT INTERVENTION (PCI-S);  Surgeon: Micheline Chapman, MD;  Location: [facility] CATH LAB;  Service: Cardiovascular;;  . PERMANENT PACEMAKER INSERTION N/A 11/04/2014   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  . TEE WITHOUT CARDIOVERSION N/A 07/17/2016   Procedure: TRANSESOPHAGEAL ECHOCARDIOGRAM (TEE);  Surgeon: Kerin Perna, MD;  Location: [facility] OR;  Service: Open Heart Surgery;  Laterality: N/A;  . TUBAL LIGATION      Current Medications: Outpatient Medications Prior to Visit  Medication Sig Dispense Refill  . acetaminophen (TYLENOL) 325 MG tablet Take 2 tablets (650 mg total) by mouth every 6 (six) hours as needed for mild pain. 30 tablet 0  . aspirin EC 81 MG tablet Take 1 tablet (81 mg total) by mouth daily.    . bisacodyl (DULCOLAX) 5 MG EC tablet Take 2 tablets (10 mg total) by mouth daily as needed for moderate constipation (Give daily if no BM). 30 tablet 0  . docusate sodium (COLACE) 100 MG capsule Take 2 capsules (200 mg total) by mouth daily. 10 capsule  0  . ENSURE (ENSURE) Take 237 mLs by mouth daily.    [name]. levalbuterol (XOPENEX) 0.63 MG/3ML nebulizer solution Take 3 mLs (0.63 mg total) by nebulization every 6 (six) hours as needed for wheezing or shortness of breath. 3 mL 12  . mometasone-formoterol (DULERA) 100-5 MCG/ACT AERO Inhale 2 puffs into the lungs 2 (two) times daily. 13 g 1  . Nutritional Supplements (NUTRITIONAL DRINK PO) Take 120 mLs by mouth 2 (two) times daily. medpass 2.0    . amiodarone (PACERONE) 200 MG tablet Take 1 tablet (200 mg total) by mouth daily. 30 tablet 1  . apixaban (ELIQUIS) 5 MG TABS tablet Take 1 tablet (5 mg total) by mouth 2 (two) times daily. (Patient taking differently: Take 5 mg by mouth daily. ) 180 tablet 1  . furosemide (LASIX) 40 MG tablet Take 1 tablet (40 mg total) by mouth daily. 30 tablet 1  . lisinopril (PRINIVIL,ZESTRIL) 5 MG tablet Take 1 tablet (5 mg total) by mouth daily. 30 tablet 1  . metoprolol  tartrate (LOPRESSOR) 25 MG tablet Take 0.5 tablets (12.5 mg total) by mouth 2 (two) times daily. 30 tablet 1  . oxyCODONE (OXY IR/ROXICODONE) 5 MG immediate release tablet Take 1 tablet (5 mg total) by mouth every 4 (four) hours as needed for severe pain. 30 tablet 0  . potassium chloride SA (K-DUR,KLOR-CON) 20 MEQ tablet Take 1 tablet (20 mEq total) by mouth daily. 30 tablet 1  . rosuvastatin (CRESTOR) 40 MG tablet Take 1 tablet (40 mg total) by mouth daily. 90 tablet 3   No facility-administered medications prior to visit.      Allergies:   Brilinta [ticagrelor]   Social History   Social History  . Marital status: Single    Spouse name: N/A  . Number of children: 3  . Years of education: N/A   Social History Main Topics  . Smoking status: Former Smoker    Types: Cigarettes    Quit date: 05/19/2013  . Smokeless tobacco: Never Used     Comment:    . Alcohol use 0.0 oz/week  . Drug use: No  . Sexual activity: Not Asked   Other Topics Concern  . None   Social History Narrative   Lives alone.     Family History:  The patient's family history includes Arthritis in her father and mother; COPD in her father; Diabetes in her other; Emphysema in her mother.   Review of Systems:   Please see the history of present illness.     General:  No chills, fever, night sweats or weight changes. Positive for decreased appetite.  Cardiovascular:  No dyspnea on exertion, edema, orthopnea, palpitations, paroxysmal nocturnal dyspnea. Positive for incisional chest pain.  Dermatological: No rash, lesions/masses Respiratory: No cough, dyspnea Urologic: No hematuria, dysuria Abdominal:   No nausea, vomiting, diarrhea, bright red blood per rectum, melena, or hematemesis Neurologic:  No visual changes, wkns, changes in mental status. All other systems reviewed and are otherwise negative except as noted above.   Physical Exam:    VS:  BP 118/77   Pulse 64   Ht 5\' 6"  (1.676 m)   Wt 115 lb 6.4  oz (52.3 kg)   BMI 18.63 kg/m    General: Pleasant, thin-appearing African American female, currently in no acute distress. Head: Normocephalic, atraumatic, sclera non-icteric, no xanthomas, nares are without discharge.  Neck: No carotid bruits. JVD not elevated.  Lungs: Respirations regular and unlabored, without wheezes or rales.  Heart: Regular  rate and rhythm. No S3 or S4.  No murmur, no rubs, or gallops appreciated. Sternal suture line appears well-healing without erythema or drainage. Chest tube sutures in place.  Abdomen: Soft, non-tender, non-distended with normoactive bowel sounds. No hepatomegaly. No rebound/guarding. No obvious abdominal masses. Msk:  Strength and tone appear normal for age. No joint deformities or effusions. Extremities: No clubbing or cyanosis. No edema.  Distal pedal pulses are 2+ bilaterally. Vein harvest sites with minimal erythema. No hematoma appreciated.  Neuro: Alert and oriented X 3. Moves all extremities spontaneously. No focal deficits noted. Psych:  Responds to questions appropriately with a normal affect. Skin: No rashes or lesions noted  Wt Readings from Last 3 Encounters:  08/03/16 115 lb 6.4 oz (52.3 kg)  07/24/16 119 lb 6.4 oz (54.2 kg)  05/03/16 119 lb (54 kg)     Studies/Labs Reviewed:   EKG:  EKG is not ordered today. Tracing from 08/01/2016 reviewed which showed NSR, HR 70, with LAD and TWI in the anterior leads (similar to previous tracings).   Recent Labs: 07/18/2016: Magnesium 2.3 08/01/2016: ALT 31; B Natriuretic Peptide 115.9; BUN 14; Creatinine, Ser 0.96; Hemoglobin 11.1; Platelets 734; Potassium 4.7; Sodium 135   Lipid Panel    Component Value Date/Time   CHOL 146 07/12/2016 0442   TRIG 66 07/12/2016 0442   HDL 47 07/12/2016 0442   CHOLHDL 3.1 07/12/2016 0442   VLDL 13 07/12/2016 0442   LDLCALC 86 07/12/2016 0442    Additional studies/ records that were reviewed today include:   Cardiac Catheterization: 07/12/2016  The  left ventricular ejection fraction is 50-55% by visual estimate.  There is mild left ventricular systolic dysfunction.  1st Diag lesion, 30 %stenosed.  Ost Cx to Prox Cx lesion, 0 %stenosed.   Low normal global LV function with an ejection fraction of 50-55% but with focal hypocontractility involving the upper mid to basal inferior wall and distal inferior apical segment.  There is a widely patent stent at the ostium of the left circumflex coronary artery, which seems to extend slightly into the distal left main.  There appears to be 2 areas of focal thrombus; one appears to be at the distal left main outside the stent strut which extends into the left main and at the ostium of the LAD outside the stent strut which appears to slightly jail this segment of LAD.  Mild 30% diagonal stenosis of the LAD.  Normal RCA.  RECOMMENDATION: With the focal thrombus without significant stenosis in the distal left main/ostial circumflex area and ostial LAD, Aggrastat was started at the end of the procedure.  The plan is to continue this for  48 hours and then plan relook catheterization with possible IVUS if necessary.   Echocardiogram: 07/14/2016 Study Conclusions  - Left ventricle: The cavity size was normal. Wall thickness was   increased in a pattern of mild LVH. Systolic function was normal.   The estimated ejection fraction was in the range of 55% to 60%.   Wall motion was normal; there were no regional wall motion   abnormalities. Features are consistent with a pseudonormal left   ventricular filling pattern, with concomitant abnormal relaxation   and increased filling pressure (grade 2 diastolic dysfunction). - Mitral valve: There was mild regurgitation. - Left atrium: The atrium was mildly dilated. - Right ventricle: Systolic function was mildly reduced. - Tricuspid valve: There was moderate regurgitation. - Pulmonary arteries: Systolic pressure was mildly increased. PA   peak pressure:  37 mm  Hg (S).  Assessment:    1. Non-ST elevation myocardial infarction (NSTEMI), subendocardial infarction, subsequent episode of care (HCC)   2. Hx of CABG 06/2016, LIMA-LAD, SVG-OM, SVG-dRCA   3. PAD (peripheral artery disease) (HCC)   4. Atrial flutter, paroxysmal (HCC)   5. Tachycardia-bradycardia (HCC)   6. Dyslipidemia   7. Essential hypertension   8. Medication management      Plan:   In order of problems listed above:  1. Subsequent Episode of Care for NSTEMI/ Recent CABG - has known history of CAD, s/p DES to LCx in 2014. Recently admitted at [facility] for NSTEMI (troponin peak of 4.10) with cath showing evidence of focal thrombus at the distal LM and ostial LAD. CT Surgery was consulted and she underwent CABG on 07/17/2016 with LIMA-LAD, SVG-OM, and SVG-dRCA. Mild anemia post-procedure and volume overload but otherwise no complications. Currently at [facility] for rehab but planning to discharge home tomorrow.  - denies any chest pain with exertion. Still having incisional pain. She is taking 1-2 tablets Oxycodone per day and is trying to cut down on this. She does not have an Rx for after discharge from SNF tomorrow so will provide 20 tablets with no refills. - does not appear volume overloaded on physical exam, actually appearing mildly hypovolemic. Will discontinue Lasix and K+ supplementation. Informed the patient to weigh herself daily and let us know is weight gain of > 3lbs overnight or if she develops lower extremity edema.  - continue ASA, Eliquis (for PAF), BB, ACE-I, and statin therapy.   2. PAD - s/p L fem-pop bypass 11/2013.  - has repeat doppler studies scheduled for 08/2016. Denies any claudication symptoms at this time.   3. Paroxysmal Atrial Flutter - maintaining NSR on exam. Started on Amiodarone 200mg  daily for prophylaxis while in the hospital. Can likely be discontinued in 4-6 weeks. - This patients CHA2DS2-VASc Score and unadjusted Ischemic Stroke  Rate (% per year) is equal to 3.2 % stroke rate/year from a score of 3 (HTN, Vascular, Female). Continue Eliquis for anticoagulation. Denies any evidence of active bleeding. Repeat CBC today.  - continue Lopressor 12.5mg  BID for rate control.   4. Tachy-brady Syndrome - s/p PPM placement 10/2014. Followed by Dr. Johney Frame.  5. Dyslipidemia - Lipid Panel on 07/12/2016 with total cholesterol 146, HDL 47, and LDL 86. LFT;s WNL on 08/01/2016. - continue Crestor 40mg  daily.   6. Essential HTN  - BP well-controlled at 118/77 today. - continue Lisinopril 5mg  daily and Lopressor 12.5mg  BID.    Medication Adjustments/Labs and Tests Ordered: Current medicines are reviewed at length with the patient today.  Concerns regarding medicines are outlined above.  Medication changes, Labs and Tests ordered today are listed in the Patient Instructions below. Patient Instructions  Medication Instructions:  Your physician recommends that you continue on your current medications as directed. Please refer to the Current Medication list given to you today.  Labwork: Your physician recommends that you return for lab work in: [employer], BMET  Testing/Procedures: None   Follow-Up: Your physician recommends that you schedule a follow-up appointment in: 2 MONTHS WITH DR Jens Som  Any Other Special Instructions Will Be Listed Below (If Applicable).  If you need a refill on your cardiac medications before your next appointment, please call your pharmacy.   Lorri Frederick, PA  08/03/2016 3:05 PM    [facility] [address] [place], [address] [place], [place]  [address] Phone: [phone]; Fax: [phone]  [address], [place], [place] [address] Phone: [phone]

## 2016-08-03 NOTE — Patient Instructions (Signed)
Medication Instructions:  Your physician recommends that you continue on your current medications as directed. Please refer to the Current Medication list given to you today.  Labwork: Your physician recommends that you return for lab work in: [employer], BMET  Testing/Procedures: None   Follow-Up: Your physician recommends that you schedule a follow-up appointment in: 2 MONTHS WITH DR Jens Som  Any Other Special Instructions Will Be Listed Below (If Applicable).     If you need a refill on your cardiac medications before your next appointment, please call your pharmacy.

## 2016-08-04 ENCOUNTER — Telehealth: Payer: Self-pay | Admitting: *Deleted

## 2016-08-04 NOTE — Telephone Encounter (Signed)
Phase 2 cardiac rehab order faxed

## 2016-08-05 DIAGNOSIS — Z48812 Encounter for surgical aftercare following surgery on the circulatory system: Secondary | ICD-10-CM | POA: Diagnosis not present

## 2016-08-15 ENCOUNTER — Telehealth: Payer: Self-pay | Admitting: Cardiology

## 2016-08-15 NOTE — Telephone Encounter (Signed)
Returned call to patient. She states everything is electric in her house, except her heat. [employer] will change her heat from gas to electric if she has a doctor's note stating this medically necessary.   Patient states electric heat is cleaner, more cost efficient, more even.   She would like a call back on determination of her request.   Routed to MD/RN

## 2016-08-15 NOTE — Telephone Encounter (Signed)
Pt says she needs a letter stating that electric heat would be better for her health than gas heating.Would you please fax to [facility] Att:Jo Mcaninch at [phone].

## 2016-08-15 NOTE — Telephone Encounter (Signed)
Not a cardiology issue Mercedes [name]

## 2016-08-16 ENCOUNTER — Telehealth: Payer: Self-pay | Admitting: *Deleted

## 2016-08-16 MED ORDER — FUROSEMIDE 40 MG PO TABS: ORAL_TABLET | ORAL | 3 refills | Status: DC

## 2016-08-16 MED ORDER — POTASSIUM CHLORIDE CRYS ER 20 MEQ PO TBCR: EXTENDED_RELEASE_TABLET | ORAL | 3 refills | Status: DC

## 2016-08-16 MED ORDER — POTASSIUM CHLORIDE CRYS ER 20 MEQ PO TBCR: 20.0000 meq | EXTENDED_RELEASE_TABLET | Freq: Every day | ORAL | 3 refills | Status: DC

## 2016-08-16 NOTE — Telephone Encounter (Signed)
Pt has been made aware of her lab results. She has been advised to stop the Lasix and K+ on a daily basis. She has been advised that she can take Lasix prn for le edema or wt gain of 3lbs in a day and to take a K+ when she has to take a lasix. Pt agreeable with this plan and verbalized understanding.

## 2016-08-16 NOTE — Telephone Encounter (Signed)
Spoke with pt, Aware of dr crenshaw's recommendations.  °

## 2016-08-16 NOTE — Telephone Encounter (Signed)
-----   Message from Ellsworth Lennox, [place] sent at 08/16/2016  8:45 AM EST ----- I would recommend she stop Lasix and K+ supplementation and take it only as needed for lower extremity edema or weight gain of > 3 lbs overnight or > 5 lbs in one week as she looked dry on physical exam when I saw her in the office. If she needs to take a Lasix tablet, then she should take K+ that same day.  Thanks,  [place]   ----- Message ----- From: Elliot Cousin, RMA Sent: 08/16/2016   8:35 AM To: Ellsworth Lennox, PA  Pt has been made aware of her lab results.   She states that she was sent home from her facility where she had rehab done and they filled her medications for her and the Lasix was still in there and she has continued to take 40 mg daily.  Please advise!

## 2016-08-23 ENCOUNTER — Telehealth: Payer: Self-pay | Admitting: Cardiology

## 2016-08-23 NOTE — Telephone Encounter (Signed)
Returned call to patient-made aware that paperwork has not been received from [facility] per [facility].  Patient reports she had them refax it this evening.   Advised I would check on paperwork to see if we received and give her a call if we did not receive fax.  Pt verbalized understanding.

## 2016-08-23 NOTE — Telephone Encounter (Signed)
Mercedes [name] is calling because there were some papers faxed from [facility] and she is wanting to know have you received them and the status of them . Please call   Thanks

## 2016-08-24 ENCOUNTER — Encounter: Payer: Self-pay | Admitting: Family

## 2016-08-24 NOTE — Telephone Encounter (Signed)
Spoke with pt, she is requesting aid help within the home. She had CABG in November and reports she is still unable to raise her hands above her head. Paperwork filled out and faxed to the number provided.

## 2016-08-29 ENCOUNTER — Other Ambulatory Visit: Payer: Self-pay | Admitting: Cardiothoracic Surgery

## 2016-08-29 DIAGNOSIS — Z951 Presence of aortocoronary bypass graft: Secondary | ICD-10-CM

## 2016-08-30 ENCOUNTER — Other Ambulatory Visit: Payer: Self-pay | Admitting: *Deleted

## 2016-08-30 ENCOUNTER — Ambulatory Visit (INDEPENDENT_AMBULATORY_CARE_PROVIDER_SITE_OTHER): Payer: Self-pay | Admitting: Cardiothoracic Surgery

## 2016-08-30 ENCOUNTER — Ambulatory Visit
Admission: RE | Admit: 2016-08-30 | Discharge: 2016-08-30 | Disposition: A | Discharge status: Home / Self Care | Payer: Medicare Other | Source: Ambulatory Visit | Attending: Cardiothoracic Surgery | Admitting: Cardiothoracic Surgery

## 2016-08-30 ENCOUNTER — Encounter: Payer: Self-pay | Admitting: Cardiothoracic Surgery

## 2016-08-30 VITALS — BP 148/87 | HR 60 | Resp 16 | Ht 66.0 in | Wt 115.0 lb

## 2016-08-30 DIAGNOSIS — I10 Essential (primary) hypertension: Secondary | ICD-10-CM

## 2016-08-30 DIAGNOSIS — Z951 Presence of aortocoronary bypass graft: Secondary | ICD-10-CM

## 2016-08-30 DIAGNOSIS — I2510 Atherosclerotic heart disease of native coronary artery without angina pectoris: Secondary | ICD-10-CM

## 2016-08-30 DIAGNOSIS — E785 Hyperlipidemia, unspecified: Secondary | ICD-10-CM

## 2016-08-30 MED ORDER — ROSUVASTATIN CALCIUM 40 MG PO TABS: 40.0000 mg | ORAL_TABLET | Freq: Every day | ORAL | 2 refills | Status: DC

## 2016-08-30 MED ORDER — METOPROLOL TARTRATE 25 MG PO TABS: 12.5000 mg | ORAL_TABLET | Freq: Two times a day (BID) | ORAL | 2 refills | Status: DC

## 2016-08-30 NOTE — Progress Notes (Signed)
PCP is No PCP Per Patient Referring Provider is Jens Som Madolyn Frieze, MD  Chief Complaint  Patient presents with  . Routine Post Op    f/u s/p CABG 07/17/16 with a CXR    HPI: 1 month follow-up after urgent CABG 3 Transient postoperative atrial fibrillation now resolved Patient has been on chronic Eliquis for years No recurrent angina, surgical incisions healing well Chest x-ray today shows clear lung fields no pleural effusion  Past Medical History:  Diagnosis Date  . Abnormal LFTs    a. in the past when on Lipitor  . Anemia    as a young woman  . Anxiety   . Arnold-Chiari malformation, type I (HCC)    MRI brain 02/2010  . CAD (coronary artery disease)    a. NSTEMI 07/2009: LHC - D2 40%, LAD irreg., EF 50% with apical AK (tako-tsubo CM);  b. Inf STEMI (04/2013): Tx Promus DES to CFX;  c. 08/2013 Lexi CL: No ischemia, dist ant/ap/inf-ap infarct, EF  d. 06/2016 NSTEMI with LM disease: s/p CABG on 07/17/2016 w/ LIMA-LAD, SVG-OM, and SVG-dRCA.  [name] DEPRESSION   . Dizziness    ? CVA 01/2010 - carotid dopplers with no ICA stenosis  . GERD (gastroesophageal reflux disease)   . HYPERLIPIDEMIA   . HYPERTENSION   . MI (myocardial infarction)   . PAD (peripheral artery disease) (HCC)    s/p L fem pop 11/2013 in setting of 1st toe osteo/gangrene  . Paroxysmal atrial fibrillation (HCC)    a. anticoagulated with Eliquis 10/2014  . RA (rheumatoid arthritis) (HCC)    prior tx by Dr. Dareen Piano  . Sjogren's syndrome (HCC) 06/02/13   pt denies this (12/01/13)  . SLE (systemic lupus erythematosus) (HCC) dx 05/2013   follows with rheum Dareen Piano)  . Tachy-brady syndrome (HCC)    a. s/p STJ Leadless pacemaker 11-04-2014 Dr Johney Frame  . 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 ABDOMINAL AORTAGRAM N/A 11/24/2013   Procedure: ABDOMINAL AORTAGRAM WITH BILATERAL RUNOFF WITH POSSIBLE  INTERVENTION;  Surgeon: Chuck Hint, MD;  Location: [facility] CATH LAB;  Service: Cardiovascular;  Laterality: N/A;  . AMPUTATION Left 12/02/2013   Procedure: LEFT 1ST TOE AMPUTATION;  Surgeon: Sherren Kerns, MD;  Location: [facility] OR;  Service: Vascular;  Laterality: Left;  . CARDIAC CATHETERIZATION N/A 07/12/2016   Procedure: Left Heart Cath and Coronary Angiography;  Surgeon: Lennette Bihari, MD;  Location: [facility] INVASIVE CV LAB;  Service: Cardiovascular;  Laterality: N/A;  . CARDIAC CATHETERIZATION N/A 07/14/2016   Procedure: Intravascular Ultrasound/IVUS;  Surgeon: Yvonne Kendall, MD;  Location: [facility] INVASIVE CV LAB;  Service: Cardiovascular;  Laterality: N/A;  . COLONOSCOPY    . CORONARY ANGIOPLASTY  04/2013  . CORONARY ARTERY BYPASS GRAFT N/A 07/17/2016   Procedure: CORONARY ARTERY BYPASS GRAFTING (CABG)x3 using right greater saphenous vein harvested endoscopiclly and left internal mammary artery;  Surgeon: Kerin Perna, MD;  Location: [facility] OR;  Service: Open Heart Surgery;  Laterality: N/A;  . FEMORAL-POPLITEAL BYPASS GRAFT Left 12/02/2013   Procedure:  LEFT FEMORAL-BELOW KNEE POPLITEAL ARTERY BYPASS GRAFT;  Surgeon: Sherren Kerns, MD;  Location: [facility] OR;  Service: Vascular;  Laterality: Left;  . LEFT HEART CATHETERIZATION WITH CORONARY ANGIOGRAM N/A 05/19/2013   Procedure: LEFT HEART CATHETERIZATION WITH CORONARY ANGIOGRAM;  Surgeon: Micheline Chapman, MD;  Location: [facility] CATH LAB;  Service: Cardiovascular;  Laterality:  N/A;  . LEFT HEART CATHETERIZATION WITH CORONARY ANGIOGRAM N/A 10/28/2014   Procedure: LEFT HEART CATHETERIZATION WITH CORONARY ANGIOGRAM;  Surgeon: Iran Ouch, MD;  Location: [facility] CATH LAB;  Service: Cardiovascular;  Laterality: N/A;  . PERCUTANEOUS CORONARY STENT INTERVENTION (PCI-S)  05/19/2013   Procedure: PERCUTANEOUS CORONARY STENT INTERVENTION (PCI-S);  Surgeon: Micheline Chapman, MD;  Location: [facility] CATH LAB;  Service: Cardiovascular;;  . PERMANENT PACEMAKER INSERTION N/A  11/04/2014   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  . TEE WITHOUT CARDIOVERSION N/A 07/17/2016   Procedure: TRANSESOPHAGEAL ECHOCARDIOGRAM (TEE);  Surgeon: Kerin Perna, MD;  Location: [facility] OR;  Service: Open Heart Surgery;  Laterality: N/A;  . TUBAL LIGATION      Family History  Problem Relation Age of Onset  . Arthritis Mother   . Emphysema Mother   . Arthritis Father   . COPD Father   . Diabetes Other     Social History Social History  Substance Use Topics  . Smoking status: Former Smoker    Types: Cigarettes    Quit date: 05/19/2013  . Smokeless tobacco: Never Used     Comment:    . Alcohol use 0.0 oz/week    Current Outpatient Prescriptions  Medication Sig Dispense Refill  . acetaminophen (TYLENOL) 325 MG tablet Take 2 tablets (650 mg total) by mouth every 6 (six) hours as needed for mild pain. 30 tablet 0  . amiodarone (PACERONE) 200 MG tablet TAKE 1 TABLET BY MOUTH DAILY 90 tablet 0  . apixaban (ELIQUIS) 5 MG TABS tablet Take 1 tablet (5 mg total) by mouth 2 (two) times daily. 180 tablet 2  . aspirin EC 81 MG tablet Take 1 tablet (81 mg total) by mouth daily.    [name] ENSURE (ENSURE) Take 237 mLs by mouth daily.    [name] lisinopril (PRINIVIL,ZESTRIL) 5 MG tablet Take 1 tablet (5 mg total) by mouth daily. 90 tablet 2  . metoprolol tartrate (LOPRESSOR) 25 MG tablet Take 0.5 tablets (12.5 mg total) by mouth 2 (two) times daily. 90 tablet 2  . rosuvastatin (CRESTOR) 40 MG tablet Take 1 tablet (40 mg total) by mouth daily. 90 tablet 2   No current facility-administered medications for this visit.     Allergies  Allergen Reactions  . Brilinta [Ticagrelor] Anaphylaxis    Also chest tightness    Review of Systems  No fever Appetite and strength improved Has not resumed smoking No chest pain No ankle swelling  BP (!) 148/87 (BP Location: Right Arm, Patient Position: Sitting, Cuff Size: Normal)   Pulse 60   Resp 16   Ht 5\' 6"  (1.676 m)   Wt 115 lb  (52.2 kg)   SpO2 99% Comment: ON RA  BMI 18.56 kg/m  Physical Exam      Exam    General- alert and comfortable   Lungs- clear without rales, wheezes   Cor- regular rate and rhythm, no murmur , gallop   Abdomen- soft, non-tender   Extremities - warm, non-tender, minimal edema   Neuro- oriented, appropriate, no focal weakness   Diagnostic Tests: Chest x-ray clear  Impression: Doing well one month postop CABG Maintaining sinus rhythm-we'll discontinue amiodarone Continue long-term anticoagulation that was started years ago She will be referred to outpatient phase II cardiac rehabilitation She understands she can lift up to 20 pounds until 3 months after surgery then restrictions are lifted  Plan: Stop amiodarone continue other current medications including lisinopril, metoprolol, aspirin, Crestor, Eliquis. Return  here as needed.   Mikey Bussing, MD [facility] [phone]

## 2016-09-06 ENCOUNTER — Ambulatory Visit (HOSPITAL_COMMUNITY)
Admission: RE | Admit: 2016-09-06 | Discharge: 2016-09-06 | Disposition: A | Discharge status: Home / Self Care | Payer: Medicare Other | Source: Ambulatory Visit | Attending: Family | Admitting: Family

## 2016-09-06 ENCOUNTER — Ambulatory Visit (INDEPENDENT_AMBULATORY_CARE_PROVIDER_SITE_OTHER)
Admission: RE | Admit: 2016-09-06 | Discharge: 2016-09-06 | Disposition: A | Discharge status: Home / Self Care | Payer: Medicare Other | Source: Ambulatory Visit | Attending: Family | Admitting: Family

## 2016-09-06 ENCOUNTER — Encounter: Payer: Self-pay | Admitting: Family

## 2016-09-06 ENCOUNTER — Ambulatory Visit (INDEPENDENT_AMBULATORY_CARE_PROVIDER_SITE_OTHER): Payer: Medicare Other | Admitting: Family

## 2016-09-06 VITALS — BP 111/73 | HR 60 | Temp 97.9°F | Resp 16 | Ht 66.0 in | Wt 118.0 lb

## 2016-09-06 DIAGNOSIS — I779 Disorder of arteries and arterioles, unspecified: Secondary | ICD-10-CM

## 2016-09-06 DIAGNOSIS — Z87891 Personal history of nicotine dependence: Secondary | ICD-10-CM

## 2016-09-06 DIAGNOSIS — Z48812 Encounter for surgical aftercare following surgery on the circulatory system: Secondary | ICD-10-CM

## 2016-09-06 DIAGNOSIS — E785 Hyperlipidemia, unspecified: Secondary | ICD-10-CM | POA: Insufficient documentation

## 2016-09-06 DIAGNOSIS — Z95828 Presence of other vascular implants and grafts: Secondary | ICD-10-CM | POA: Insufficient documentation

## 2016-09-06 DIAGNOSIS — I10 Essential (primary) hypertension: Secondary | ICD-10-CM | POA: Insufficient documentation

## 2016-09-06 DIAGNOSIS — I739 Peripheral vascular disease, unspecified: Secondary | ICD-10-CM | POA: Insufficient documentation

## 2016-09-06 DIAGNOSIS — Z7722 Contact with and (suspected) exposure to environmental tobacco smoke (acute) (chronic): Secondary | ICD-10-CM

## 2016-09-06 DIAGNOSIS — R0989 Other specified symptoms and signs involving the circulatory and respiratory systems: Secondary | ICD-10-CM | POA: Diagnosis present

## 2016-09-06 NOTE — Progress Notes (Signed)
[facility] [place]   CC: Follow up peripheral artery occlusive disease  History of Present Illness Mercedes [name] is a 61 y.o. female patient of Dr. Darrick Penna who is s/p left femoral to below-knee popliteal bypass and amputation of her left first toe on December 02, 2013. She had one vessel peroneal runoff. This was a non-reversed vein graft. She returns for followup today.   The rest pain in her left foot has resolved.  She no longer has claudication symptoms with walking.   Pt denies any known history of stroke or TIA, does report 2 MI's.  Leadless pacemaker inserted March 2016 for tachy/brady, states this doing well.  She had a CABG in November 2017, then rehab in a NH.  She attends cardiac rehab now.    Pt Diabetic: No Pt smoker: former smoker, quit in 2015  Pt meds include: Statin : yes ASA: yes Other anticoagulants/antiplatelets: Eliquis    Past Medical History:  Diagnosis Date  . Abnormal LFTs    a. in the past when on Lipitor  . Anemia    as a young woman  . Anxiety   . Arnold-Chiari malformation, type I (HCC)    MRI brain 02/2010  . CAD (coronary artery disease)    a. NSTEMI 07/2009: LHC - D2 40%, LAD irreg., EF 50% with apical AK (tako-tsubo CM);  b. Inf STEMI (04/2013): Tx Promus DES to CFX;  c. 08/2013 Lexi CL: No ischemia, dist ant/ap/inf-ap infarct, EF  d. 06/2016 NSTEMI with LM disease: s/p CABG on 07/17/2016 w/ LIMA-LAD, SVG-OM, and SVG-dRCA.  [name] DEPRESSION   . Dizziness    ? CVA 01/2010 - carotid dopplers with no ICA stenosis  . GERD (gastroesophageal reflux disease)   . HYPERLIPIDEMIA   . HYPERTENSION   . MI (myocardial infarction)   . PAD (peripheral artery disease) (HCC)    s/p L fem pop 11/2013 in setting of 1st toe osteo/gangrene  . Paroxysmal atrial fibrillation (HCC)    a. anticoagulated with Eliquis 10/2014  . RA (rheumatoid arthritis) (HCC)    prior tx by Dr. Dareen Piano  . Sjogren's syndrome (HCC) 06/02/13   pt denies this  (12/01/13)  . SLE (systemic lupus erythematosus) (HCC) dx 05/2013   follows with rheum Dareen Piano)  . Tachy-brady syndrome (HCC)    a. s/p STJ Leadless pacemaker 11-04-2014 Dr Johney Frame  . 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 Smoking status: Former Smoker    Types: Cigarettes    Quit date: 05/19/2013  . Smokeless tobacco: Never Used     Comment:    . Alcohol use 0.0 oz/week    Family History Family History  Problem Relation Age of Onset  . Arthritis Mother   . Emphysema Mother   . Arthritis Father   . COPD Father   . Diabetes Other     Past Surgical History:  Procedure Laterality Date  . ABDOMINAL AORTAGRAM N/A 11/24/2013   Procedure: ABDOMINAL AORTAGRAM WITH BILATERAL RUNOFF WITH POSSIBLE INTERVENTION;  Surgeon: Chuck Hint, MD;  Location[facility] CATH LAB;  Service: Cardiovascular;  Laterality: N/A;  . AMPUTATION Left 12/02/2013   Procedure: LEFT 1ST TOE AMPUTATION;  Surgeon: Sherren Kerns, MD;  Location: [facility] OR;  Service: Vascular;  Laterality: Left;  . CARDIAC CATHETERIZATION N/A 07/12/2016   Procedure: Left Heart Cath and Coronary Angiography;  Surgeon:  Lennette Bihari, MD;  Location: [facility] INVASIVE CV LAB;  Service: Cardiovascular;  Laterality: N/A;  . CARDIAC CATHETERIZATION N/A 07/14/2016   Procedure: Intravascular Ultrasound/IVUS;  Surgeon: Yvonne Kendall, MD;  Location: [facility] INVASIVE CV LAB;  Service: Cardiovascular;  Laterality: N/A;  . COLONOSCOPY    . CORONARY ANGIOPLASTY  04/2013  . CORONARY ARTERY BYPASS GRAFT N/A 07/17/2016   Procedure: CORONARY ARTERY BYPASS GRAFTING (CABG)x3 using right greater saphenous vein harvested endoscopiclly and left internal mammary artery;  Surgeon: Kerin Perna, MD;  Location: [facility] OR;  Service: Open Heart Surgery;  Laterality: N/A;  . FEMORAL-POPLITEAL BYPASS GRAFT Left 12/02/2013   Procedure:  LEFT FEMORAL-BELOW  KNEE POPLITEAL ARTERY BYPASS GRAFT;  Surgeon: Sherren Kerns, MD;  Location: [facility] OR;  Service: Vascular;  Laterality: Left;  . LEFT HEART CATHETERIZATION WITH CORONARY ANGIOGRAM N/A 05/19/2013   Procedure: LEFT HEART CATHETERIZATION WITH CORONARY ANGIOGRAM;  Surgeon: Micheline Chapman, MD;  Location: [facility] CATH LAB;  Service: Cardiovascular;  Laterality: N/A;  . LEFT HEART CATHETERIZATION WITH CORONARY ANGIOGRAM N/A 10/28/2014   Procedure: LEFT HEART CATHETERIZATION WITH CORONARY ANGIOGRAM;  Surgeon: Iran Ouch, MD;  Location: [facility] CATH LAB;  Service: Cardiovascular;  Laterality: N/A;  . PERCUTANEOUS CORONARY STENT INTERVENTION (PCI-S)  05/19/2013   Procedure: PERCUTANEOUS CORONARY STENT INTERVENTION (PCI-S);  Surgeon: Micheline Chapman, MD;  Location: [facility] CATH LAB;  Service: Cardiovascular;;  . PERMANENT PACEMAKER INSERTION N/A 11/04/2014   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  . TEE WITHOUT CARDIOVERSION N/A 07/17/2016   Procedure: TRANSESOPHAGEAL ECHOCARDIOGRAM (TEE);  Surgeon: Kerin Perna, MD;  Location: [facility] OR;  Service: Open Heart Surgery;  Laterality: N/A;  . TUBAL LIGATION      Allergies  Allergen Reactions  . Brilinta [Ticagrelor] Anaphylaxis    Also chest tightness    Current Outpatient Prescriptions  Medication Sig Dispense Refill  . acetaminophen (TYLENOL) 325 MG tablet Take 2 tablets (650 mg total) by mouth every 6 (six) hours as needed for mild pain. 30 tablet 0  . amiodarone (PACERONE) 200 MG tablet TAKE 1 TABLET BY MOUTH DAILY 90 tablet 0  . apixaban (ELIQUIS) 5 MG TABS tablet Take 1 tablet (5 mg total) by mouth 2 (two) times daily. 180 tablet 2  . aspirin EC 81 MG tablet Take 1 tablet (81 mg total) by mouth daily.    [name] ENSURE (ENSURE) Take 237 mLs by mouth daily.    [name] lisinopril (PRINIVIL,ZESTRIL) 5 MG tablet Take 1 tablet (5 mg total) by mouth daily. 90 tablet 2  . metoprolol tartrate (LOPRESSOR) 25 MG tablet Take 0.5 tablets (12.5 mg total) by mouth  2 (two) times daily. 90 tablet 2  . rosuvastatin (CRESTOR) 40 MG tablet Take 1 tablet (40 mg total) by mouth daily. 90 tablet 2   No current facility-administered medications for this visit.     ROS: See HPI for pertinent positives and negatives.   Physical Examination  Vitals:   09/06/16 1055  BP: 111/73  Pulse: 60  Resp: 16  Temp: 97.9 F (36.6 C)  SpO2: 99%  Weight: 118 lb (53.5 kg)  Height: 5\' 6"  (1.676 m)   Body mass index is 19.05 kg/m.  General: A&O x 3, thin female, WDWN. Gait: normal Eyes: PERRLA. Pulmonary: CTAB, respirations are non labored Cardiac: regular rythm, no detected murmur.     Carotid Bruits Right Left   Negative Negative  Aorta is not palpable. Radial pulses: are 2+ palpable And =  VASCULAR EXAM: Extremities without ischemic changes  without Gangrene; without open wounds. Left great toe is surgically absent. Bilateral toe tips are cool, sluggish capillary refill.      LE Pulses Right Left   FEMORAL  palpable  palpable    POPLITEAL not palpable  not palpable   POSTERIOR TIBIAL not palpable  not palpable    DORSALIS PEDIS  ANTERIOR TIBIAL not palpable  faintly palpable    Abdomen: soft, NT, no palpable masses. Skin: no rashes, no ulcers. Musculoskeletal: no muscle wasting or atrophy. Neurologic: A&O X 3; Appropriate Affect, sensation is normal,  MOTOR FUNCTION: moving all extremities equally, motor strength 5/5 throughout. Speech is fluent/normal. CN 2-12 intact.     ASSESSMENT: Mercedes [name] is a 61 y.o. female who is s/p left femoral to below-knee popliteal bypass and amputation of her left first toe on December 02 2013. She had one vessel peroneal runoff. This was a non-reversed  vein graft. The rest pain in her left foot has resolved. She has phantom pain in the left first toe amputation site, states gabapentin did not help, but it does not seem to bother her as much.  She has right calf claudication after walking about 2-3 blocks, relieved by rest. She has no signs of ischemia in her feet/legs.  She is walking more and feeling well.   Pt's atherosclerotic risk factors include former smoker and CAD.  She takes a statin, ASA, and Eliquis.  DATA Left LE arterial duplex today indicates a patent left LE bypass graft with no evidence of vessel narrowing or restenosis, biphasic waveforms. No significant change in comparison to the last exam on 12-30-15.  ABI's: Right: 0.62 (0.59 on 12-30-15), monophasic waveforms, TBI: 0.00 Left: 0.89 (0.89) monophasic waveforms, TBI: left great toe surgically absent   PLAN:  Graduated walking program discussed and how to achieve.    Based on the patient's vascular studies and examination, pt will return to clinic in 1 year with ABI's and left LE arterial duplex. I advised her to notify us if she develops concerns re the circulation in her feet/legs.   I discussed in depth with the patient the nature of atherosclerosis, and emphasized the importance of maximal medical management including strict control of blood pressure, blood glucose, and lipid levels, obtaining regular exercise, and continued cessation of smoking.  The patient is aware that without maximal medical management the underlying atherosclerotic disease process will progress, limiting the benefit of any interventions.  The patient was given information about PAD including signs, symptoms, treatment, what symptoms should prompt the patient to seek immediate medical care, and risk reduction measures to take.  Charisse March, RN, MSN, FNP-C [facility] [place] Phone: [phone]  Clinic MD: Edilia Bo  09/06/16 11:20 AM

## 2016-09-06 NOTE — Patient Instructions (Signed)

## 2016-09-07 NOTE — Addendum Note (Signed)
Addended by: Burton Apley A on: 09/07/2016 04:59 PM   Modules accepted: Orders

## 2016-09-20 ENCOUNTER — Other Ambulatory Visit: Payer: Self-pay | Admitting: Cardiology

## 2016-09-29 ENCOUNTER — Other Ambulatory Visit (INDEPENDENT_AMBULATORY_CARE_PROVIDER_SITE_OTHER): Payer: Medicare Other

## 2016-09-29 ENCOUNTER — Ambulatory Visit (INDEPENDENT_AMBULATORY_CARE_PROVIDER_SITE_OTHER): Payer: Medicare Other | Admitting: Internal Medicine

## 2016-09-29 ENCOUNTER — Encounter: Payer: Self-pay | Admitting: Internal Medicine

## 2016-09-29 VITALS — BP 118/76 | HR 73 | Temp 97.8°F | Resp 16 | Ht 66.0 in | Wt 118.0 lb

## 2016-09-29 DIAGNOSIS — I10 Essential (primary) hypertension: Secondary | ICD-10-CM

## 2016-09-29 DIAGNOSIS — M069 Rheumatoid arthritis, unspecified: Secondary | ICD-10-CM

## 2016-09-29 DIAGNOSIS — R569 Unspecified convulsions: Secondary | ICD-10-CM | POA: Diagnosis not present

## 2016-09-29 DIAGNOSIS — R7303 Prediabetes: Secondary | ICD-10-CM | POA: Diagnosis not present

## 2016-09-29 DIAGNOSIS — E785 Hyperlipidemia, unspecified: Secondary | ICD-10-CM | POA: Diagnosis not present

## 2016-09-29 LAB — COMPREHENSIVE METABOLIC PANEL
ALT: 17 U/L (ref 0–35)
AST: 21 U/L (ref 0–37)
Albumin: 4.1 g/dL (ref 3.5–5.2)
Alkaline Phosphatase: 76 U/L (ref 39–117)
BUN: 21 mg/dL (ref 6–23)
CALCIUM: 10.4 mg/dL (ref 8.4–10.5)
CO2: 28 meq/L (ref 19–32)
CREATININE: 1 mg/dL (ref 0.40–1.20)
Chloride: 104 mEq/L (ref 96–112)
GFR: 72.09 mL/min (ref >60.00)
Glucose, Bld: 91 mg/dL (ref 70–99)
POTASSIUM: 4.5 meq/L (ref 3.5–5.1)
Sodium: 137 mEq/L (ref 135–145)
Total Bilirubin: 0.3 mg/dL (ref 0.2–1.2)
Total Protein: 8.5 g/dL — ABNORMAL HIGH (ref 6.0–8.3)

## 2016-09-29 LAB — CBC WITH DIFFERENTIAL/PLATELET
BASOS PCT: 0.2 % (ref 0.0–3.0)
Basophils Absolute: 0 10*3/uL (ref 0.0–0.1)
EOS ABS: 0.2 10*3/uL (ref 0.0–0.7)
EOS PCT: 3.1 % (ref 0.0–5.0)
HEMATOCRIT: 38 % (ref 36.0–46.0)
HEMOGLOBIN: 12.1 g/dL (ref 12.0–15.0)
Lymphocytes Relative: 28.9 % (ref 12.0–46.0)
Lymphs Abs: 1.5 10*3/uL (ref 0.7–4.0)
MCHC: 31.9 g/dL (ref 30.0–36.0)
MCV: 75.7 fl — AB (ref 78.0–100.0)
MONOS PCT: 9.5 % (ref 3.0–12.0)
Monocytes Absolute: 0.5 10*3/uL (ref 0.1–1.0)
Neutro Abs: 3.1 10*3/uL (ref 1.4–7.7)
Neutrophils Relative %: 58.3 % (ref 43.0–77.0)
PLATELETS: 375 10*3/uL (ref 150.0–400.0)
RBC: 5.02 Mil/uL (ref 3.87–5.11)
RDW: 17.7 % — AB (ref 11.5–15.5)
WBC: 5.3 10*3/uL (ref 4.0–10.5)

## 2016-09-29 LAB — HEMOGLOBIN A1C: HEMOGLOBIN A1C: 6.2 % (ref 4.6–6.5)

## 2016-09-29 LAB — TSH: TSH: 0.25 u[IU]/mL — ABNORMAL LOW (ref 0.35–4.50)

## 2016-09-29 MED ORDER — ROSUVASTATIN CALCIUM 40 MG PO TABS: 40.0000 mg | ORAL_TABLET | Freq: Every day | ORAL | 3 refills | Status: DC

## 2016-09-29 NOTE — Assessment & Plan Note (Addendum)
Has shoulder pain, no other joint pain Gone into "remission" Has not seen rheumatology for a while

## 2016-09-29 NOTE — Assessment & Plan Note (Signed)
BP well controlled Current regimen effective and well tolerated Continue current medications at current doses cmp  

## 2016-09-29 NOTE — Progress Notes (Signed)
Subjective:    Patient ID: Mercedes [name], female    DOB: 1955-09-04, 61 y.o.   MRN: [id number]  HPI She is here to establish with a new pcp.  She is here for follow up.  CAD, s/p MI x 3, hyperlipidemia, hypertension, PVD:  She is taking all her medication, except the Crestor - she states she was not aware she was supposed to be taking it, but agrees to start.  She still has chest wall pain and she takes tylenol as needed.  The pain is improving. She denies SOB, palpitations, edema, headaches, and lightheadedness.  She is not exercising regularly.  She will be starting cardiac rehab if covered by her insurance.   Prediabetes:  In the past her sugars have been elevated - even up to the diabetic range at one point. She was not aware of this. She does eat a lot of candy, cookies and more recently milkshakes to help her gain weight.  She is currently not exercising regularly.  She has lost weight. She states she has no appetite and her taste is decreased. She is drinking Ensure. She is trying to drink milk shakes to increase her weight.  She denies depression or anxiety.  Medications and allergies reviewed with patient and updated if appropriate.  Patient Active Problem List   Diagnosis Date Noted  . Prediabetes 09/29/2016  . Coronary artery disease involving native heart without angina pectoris   . Hx of CABG 07/17/2016  . Paroxysmal atrial fibrillation (HCC) 07/12/2016  . Chest pain 07/11/2016  . NSTEMI (non-ST elevated myocardial infarction) (HCC) 07/11/2016  . History of arterial bypass of lower extremity 12/30/2015  . Claudication of right lower extremity (HCC) 12/30/2015  . Phantom limb pain-Left great toe 04/29/2015  . Seizure (HCC)   . Atrial flutter with rapid ventricular response vs. SVT 10/27/2014    Class: Acute  . 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 PAD (peripheral artery disease) (HCC) 12/02/2013  . Unstable  angina-jaw pain 09/03/2013  . SLE (systemic lupus erythematosus) (HCC)   . CAD - S/P Takotsubo MI in 2000, CFX DES Sept 2014 05/25/2013  . Acute MI, inferior wall (HCC) 05/19/2013  . 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 Tachycardia-bradycardia (HCC) 03/09/2010  . PVD- s/p Lt [facility] April 2015 10/29/2009  . Dyslipidemia 08/26/2009  . Essential hypertension 08/26/2009  . CORONARY ATHEROSCLEROSIS NATIVE CORONARY ARTERY 08/26/2009  . 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 Rheumatoid arthritis (HCC) 08/26/2009    Current Outpatient Prescriptions on File Prior to Visit  Medication Sig Dispense Refill  . acetaminophen (TYLENOL) 325 MG tablet Take 2 tablets (650 mg total) by mouth every 6 (six) hours as needed for mild pain. 30 tablet 0  . amiodarone (PACERONE) 200 MG tablet TAKE 1 TABLET BY MOUTH DAILY 90 tablet 0  . apixaban (ELIQUIS) 5 MG TABS tablet Take 1 tablet (5 mg total) by mouth 2 (two) times daily. 180 tablet 2  . aspirin EC 81 MG tablet Take 1 tablet (81 mg total) by mouth daily.    [name] ENSURE (ENSURE) Take 237 mLs by mouth daily.    [name] lisinopril (PRINIVIL,ZESTRIL) 5 MG tablet Take 1 tablet (5 mg total) by mouth daily. 90 tablet 2  . metoprolol tartrate (LOPRESSOR) 25 MG tablet Take 0.5 tablets (12.5 mg total) by mouth 2 (two) times daily. 90 tablet 2   No current facility-administered medications on file prior to visit.     Past Medical  History:  Diagnosis Date  . Abnormal LFTs    a. in the past when on Lipitor  . Anemia    as a young woman  . Anxiety   . Arnold-Chiari malformation, type I (HCC)    MRI brain 02/2010  . CAD (coronary artery disease)    a. NSTEMI 07/2009: LHC - D2 40%, LAD irreg., EF 50% with apical AK (tako-tsubo CM);  b. Inf STEMI (04/2013): Tx Promus DES to CFX;  c. 08/2013 Lexi CL: No ischemia, dist ant/ap/inf-ap infarct, EF  d. 06/2016 NSTEMI with LM disease: s/p CABG on 07/17/2016 w/ LIMA-LAD, SVG-OM, and SVG-dRCA.  [name] DEPRESSION   . Dizziness    ?  CVA 01/2010 - carotid dopplers with no ICA stenosis  . GERD (gastroesophageal reflux disease)   . HYPERLIPIDEMIA   . HYPERTENSION   . MI (myocardial infarction)   . PAD (peripheral artery disease) (HCC)    s/p L fem pop 11/2013 in setting of 1st toe osteo/gangrene  . Paroxysmal atrial fibrillation (HCC)    a. anticoagulated with Eliquis 10/2014  . RA (rheumatoid arthritis) (HCC)    prior tx by Dr. Dareen Piano  . Sjogren's syndrome (HCC) 06/02/13   pt denies this (12/01/13)  . SLE (systemic lupus erythematosus) (HCC) dx 05/2013   follows with rheum Dareen Piano)  . Tachy-brady syndrome (HCC)    a. s/p STJ Leadless pacemaker 11-04-2014 Dr Johney Frame  . 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 ABDOMINAL AORTAGRAM N/A 11/24/2013   Procedure: ABDOMINAL AORTAGRAM WITH BILATERAL RUNOFF WITH POSSIBLE INTERVENTION;  Surgeon: Chuck Hint, MD;  Location: [facility] CATH LAB;  Service: Cardiovascular;  Laterality: N/A;  . AMPUTATION Left 12/02/2013   Procedure: LEFT 1ST TOE AMPUTATION;  Surgeon: Sherren Kerns, MD;  Location: [facility] OR;  Service: Vascular;  Laterality: Left;  . CARDIAC CATHETERIZATION N/A 07/12/2016   Procedure: Left Heart Cath and Coronary Angiography;  Surgeon: Lennette Bihari, MD;  Location: [facility] INVASIVE CV LAB;  Service: Cardiovascular;  Laterality: N/A;  . CARDIAC CATHETERIZATION N/A 07/14/2016   Procedure: Intravascular Ultrasound/IVUS;  Surgeon: Yvonne Kendall, MD;  Location: [facility] INVASIVE CV LAB;  Service: Cardiovascular;  Laterality: N/A;  . COLONOSCOPY    . CORONARY ANGIOPLASTY  04/2013  . CORONARY ARTERY BYPASS GRAFT N/A 07/17/2016   Procedure: CORONARY ARTERY BYPASS GRAFTING (CABG)x3 using right greater saphenous vein harvested endoscopiclly and left internal mammary artery;  Surgeon: Kerin Perna, MD;  Location: [facility] OR;  Service: Open Heart Surgery;  Laterality: N/A;    . FEMORAL-POPLITEAL BYPASS GRAFT Left 12/02/2013   Procedure:  LEFT FEMORAL-BELOW KNEE POPLITEAL ARTERY BYPASS GRAFT;  Surgeon: Sherren Kerns, MD;  Location: [facility] OR;  Service: Vascular;  Laterality: Left;  . LEFT HEART CATHETERIZATION WITH CORONARY ANGIOGRAM N/A 05/19/2013   Procedure: LEFT HEART CATHETERIZATION WITH CORONARY ANGIOGRAM;  Surgeon: Micheline Chapman, MD;  Location: [facility] CATH LAB;  Service: Cardiovascular;  Laterality: N/A;  . LEFT HEART CATHETERIZATION WITH CORONARY ANGIOGRAM N/A 10/28/2014   Procedure: LEFT HEART CATHETERIZATION WITH CORONARY ANGIOGRAM;  Surgeon: Iran Ouch, MD;  Location: [facility] CATH LAB;  Service: Cardiovascular;  Laterality: N/A;  . PERCUTANEOUS CORONARY STENT INTERVENTION (PCI-S)  05/19/2013   Procedure: PERCUTANEOUS CORONARY STENT INTERVENTION (PCI-S);  Surgeon: Micheline Chapman, MD;  Location: [facility] CATH LAB;  Service: Cardiovascular;;  . PERMANENT PACEMAKER INSERTION N/A 11/04/2014   STJ  Leadless pacemaker implanted by Dr Johney Frame for tachy/brady  . TEE WITHOUT CARDIOVERSION N/A 07/17/2016   Procedure: TRANSESOPHAGEAL ECHOCARDIOGRAM (TEE);  Surgeon: Kerin Perna, MD;  Location: [facility] OR;  Service: Open Heart Surgery;  Laterality: N/A;  . TUBAL LIGATION      Social History   Social History  . Marital status: Single    Spouse name: N/A  . Number of children: 3  . Years of education: N/A   Social History Main Topics  . Smoking status: Former Smoker    Types: Cigarettes    Quit date: 05/19/2013  . Smokeless tobacco: Never Used     Comment:    . Alcohol use 0.0 oz/week  . Drug use: No  . Sexual activity: Not Asked   Other Topics Concern  . None   Social History Narrative   Lives alone.    Family History  Problem Relation Age of Onset  . Arthritis Mother   . Emphysema Mother   . Arthritis Father   . COPD Father   . Diabetes Other     Review of Systems  Constitutional: Positive for appetite change (decreased, taste is decreased). Negative for  fever.       Feels warm a lot  Respiratory: Positive for cough (occ, sometimes after taking medication). Negative for shortness of breath and wheezing.   Cardiovascular: Positive for chest pain (chest wall pain only). Negative for palpitations and leg swelling.  Gastrointestinal: Negative for abdominal pain and nausea.       No gerd  Genitourinary: Positive for urgency.  Neurological: Negative for dizziness, light-headedness and headaches.  Psychiatric/Behavioral: Negative for dysphoric mood. The patient is not nervous/anxious.        Objective:   Vitals:   09/29/16 1010  BP: 118/76  Pulse: 73  Resp: 16  Temp: 97.8 F (36.6 C)   Filed Weights   09/29/16 1010  Weight: 118 lb (53.5 kg)   Body mass index is 19.05 kg/m.  Wt Readings from Last 3 Encounters:  09/29/16 118 lb (53.5 kg)  09/06/16 118 lb (53.5 kg)  08/30/16 115 lb (52.2 kg)     Physical Exam Constitutional: Appears well-developed and well-nourished. No distress.  HENT:  Head: Normocephalic and atraumatic.  Neck: Neck supple. No tracheal deviation present. No thyromegaly present.  No cervical lymphadenopathy Cardiovascular: Normal rate, regular rhythm and normal heart sounds.   1/6 systolic murmur heard. No carotid bruit .  No edema Pulmonary/Chest: Effort normal and breath sounds normal. No respiratory distress. No has no wheezes. No rales.  Skin: Skin is warm and dry. Not diaphoretic.  Psychiatric: Normal mood and affect. Behavior is normal.        Assessment & Plan:   See Problem List for Assessment and Plan of chronic medical problems.

## 2016-09-29 NOTE — Assessment & Plan Note (Signed)
Not taking Crestor-she states she was not aware that she needed to take it New Prescription sent to pharmacy-stressed taking it daily Discussed low sugar diet

## 2016-09-29 NOTE — Progress Notes (Signed)
Pre visit review using our clinic review tool, if applicable. No additional management support is needed unless otherwise documented below in the visit note. 

## 2016-09-29 NOTE — Patient Instructions (Signed)
  Test(s) ordered today. Your results will be released to MyChart (or called to you) after review, usually within 72hours after test completion. If any changes need to be made, you will be notified at that same time.  All other Health Maintenance issues reviewed.   All recommended immunizations and age-appropriate screenings are up-to-date or discussed.  No immunizations administered today.   Medications reviewed and updated.  Changes include restarting crestor daily.  Your prescription(s) have been submitted to your pharmacy. Please take as directed and contact our office if you believe you are having problem(s) with the medication(s).  Please followup in 6 months

## 2016-09-29 NOTE — Assessment & Plan Note (Signed)
Check A1c Stressed low sugar/carbohydrate diet-limiting candy and decrease cookies Encouraged her to remain active-hopefully she will be able to start cardiac rehabilitation Follow up in 6 months to monitor A1c

## 2016-09-30 ENCOUNTER — Other Ambulatory Visit: Payer: Self-pay | Admitting: Internal Medicine

## 2016-09-30 DIAGNOSIS — R7989 Other specified abnormal findings of blood chemistry: Secondary | ICD-10-CM | POA: Insufficient documentation

## 2016-09-30 DIAGNOSIS — R718 Other abnormality of red blood cells: Secondary | ICD-10-CM | POA: Insufficient documentation

## 2016-09-30 NOTE — Assessment & Plan Note (Signed)
Recheck tfts and Ab

## 2016-10-02 NOTE — Progress Notes (Signed)
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  Admitted 11/17 with NSTEMI (troponin peak of 4.10) and subsequent CABG. Initial cath showed a widely patent LCx stent but there was evidence of focal thrombus at the distal LM and ostial LAD. She was continued on Aggrastat and a relook cath was performed on 11/17 which showed 40-50% distal LMCA haziness and 70-80% ostial LAD stenosis. Echocardiogram November 2017 showed ejection fraction 55-60%, grade 2 diastolic dysfunction, mild left atrial enlargement, mild mitral regurgitation and moderate tricuspid regurgitation. Preop carotid Dopplers November 2017 showed 1-39% right and 40-59% left stenosis. Went for CABG on 07/17/2016 with LIMA-LAD, SVG-OM, and SVG-dRCA). Also with h/o PAF.  Was seen in the ER on 08/01/2016 for chest pain which she described as a burning sensation under her incision. EKG showed TWI in the anterior leads which was similar to previous tracings.  Pain was thought to be more related to her incision as troponin values were flat at 0.05 and 0.03.  Since last seen, there is no dyspnea. She has some residual soreness at her sternotomy but no other chest pain. No syncope.  Current Outpatient Prescriptions  Medication Sig Dispense Refill  . acetaminophen (TYLENOL) 325 MG tablet Take 2 tablets (650 mg total) by mouth every 6 (six) hours as needed for mild pain. 30 tablet 0  . amiodarone (PACERONE) 200 MG tablet TAKE 1 TABLET BY MOUTH DAILY 90 tablet 0  . apixaban (ELIQUIS) 5 MG TABS tablet Take 1 tablet (5 mg total) by mouth 2 (two) times daily. 180 tablet 2  . aspirin EC 81 MG tablet Take 1 tablet (81 mg total) by mouth daily.    [name] ENSURE (ENSURE) Take 237 mLs by mouth daily.    [name] lisinopril (PRINIVIL,ZESTRIL) 5 MG tablet Take 1 tablet (5 mg total) by mouth daily. 90 tablet 2  . metoprolol tartrate (LOPRESSOR) 25 MG tablet Take 0.5  tablets (12.5 mg total) by mouth 2 (two) times daily. 90 tablet 2  . rosuvastatin (CRESTOR) 40 MG tablet Take 1 tablet (40 mg total) by mouth daily. 90 tablet 3   No current facility-administered medications for this visit.      Past Medical History:  Diagnosis Date  . Abnormal LFTs    a. in the past when on Lipitor  . Anemia    as a young woman  . Anxiety   . Arnold-Chiari malformation, type I (HCC)    MRI brain 02/2010  . CAD (coronary artery disease)    a. NSTEMI 07/2009: LHC - D2 40%, LAD irreg., EF 50% with apical AK (tako-tsubo CM);  b. Inf STEMI (04/2013): Tx Promus DES to CFX;  c. 08/2013 Lexi CL: No ischemia, dist ant/ap/inf-ap infarct, EF  d. 06/2016 NSTEMI with LM disease: s/p CABG on 07/17/2016 w/ LIMA-LAD, SVG-OM, and SVG-dRCA.  [name] DEPRESSION   . Dizziness    ? CVA 01/2010 - carotid dopplers with no ICA stenosis  . GERD (gastroesophageal reflux disease)   . HYPERLIPIDEMIA   . HYPERTENSION   . MI (myocardial infarction)   . PAD (peripheral artery disease) (HCC)    s/p L fem pop 11/2013 in setting of 1st toe osteo/gangrene  . Paroxysmal atrial fibrillation (HCC)    a. anticoagulated with Eliquis 10/2014  . RA (rheumatoid arthritis) (HCC)    prior tx by Dr. Dareen Piano  . Sjogren's syndrome (HCC) 06/02/13   pt denies this (  12/01/13)  . SLE (systemic lupus erythematosus) (HCC) dx 05/2013   follows with rheum Dareen Piano)  . Tachy-brady syndrome (HCC)    a. s/p STJ Leadless pacemaker 11-04-2014 Dr Johney Frame  . 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 ABDOMINAL AORTAGRAM N/A 11/24/2013   Procedure: ABDOMINAL AORTAGRAM WITH BILATERAL RUNOFF WITH POSSIBLE INTERVENTION;  Surgeon: Chuck Hint, MD;  Location: [facility] CATH LAB;  Service: Cardiovascular;  Laterality: N/A;  . AMPUTATION Left 12/02/2013   Procedure: LEFT 1ST TOE AMPUTATION;  Surgeon: Sherren Kerns, MD;   Location: [facility] OR;  Service: Vascular;  Laterality: Left;  . CARDIAC CATHETERIZATION N/A 07/12/2016   Procedure: Left Heart Cath and Coronary Angiography;  Surgeon: Lennette Bihari, MD;  Location: [facility] INVASIVE CV LAB;  Service: Cardiovascular;  Laterality: N/A;  . CARDIAC CATHETERIZATION N/A 07/14/2016   Procedure: Intravascular Ultrasound/IVUS;  Surgeon: Yvonne Kendall, MD;  Location: [facility] INVASIVE CV LAB;  Service: Cardiovascular;  Laterality: N/A;  . COLONOSCOPY    . CORONARY ANGIOPLASTY  04/2013  . CORONARY ARTERY BYPASS GRAFT N/A 07/17/2016   Procedure: CORONARY ARTERY BYPASS GRAFTING (CABG)x3 using right greater saphenous vein harvested endoscopiclly and left internal mammary artery;  Surgeon: Kerin Perna, MD;  Location: [facility] OR;  Service: Open Heart Surgery;  Laterality: N/A;  . FEMORAL-POPLITEAL BYPASS GRAFT Left 12/02/2013   Procedure:  LEFT FEMORAL-BELOW KNEE POPLITEAL ARTERY BYPASS GRAFT;  Surgeon: Sherren Kerns, MD;  Location: [facility] OR;  Service: Vascular;  Laterality: Left;  . LEFT HEART CATHETERIZATION WITH CORONARY ANGIOGRAM N/A 05/19/2013   Procedure: LEFT HEART CATHETERIZATION WITH CORONARY ANGIOGRAM;  Surgeon: Micheline Chapman, MD;  Location: [facility] CATH LAB;  Service: Cardiovascular;  Laterality: N/A;  . LEFT HEART CATHETERIZATION WITH CORONARY ANGIOGRAM N/A 10/28/2014   Procedure: LEFT HEART CATHETERIZATION WITH CORONARY ANGIOGRAM;  Surgeon: Iran Ouch, MD;  Location: [facility] CATH LAB;  Service: Cardiovascular;  Laterality: N/A;  . PERCUTANEOUS CORONARY STENT INTERVENTION (PCI-S)  05/19/2013   Procedure: PERCUTANEOUS CORONARY STENT INTERVENTION (PCI-S);  Surgeon: Micheline Chapman, MD;  Location: [facility] CATH LAB;  Service: Cardiovascular;;  . PERMANENT PACEMAKER INSERTION N/A 11/04/2014   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  . TEE WITHOUT CARDIOVERSION N/A 07/17/2016   Procedure: TRANSESOPHAGEAL ECHOCARDIOGRAM (TEE);  Surgeon: Kerin Perna, MD;  Location: [facility] OR;   Service: Open Heart Surgery;  Laterality: N/A;  . TUBAL LIGATION      Social History   Social History  . Marital status: Single    Spouse name: N/A  . Number of children: 3  . Years of education: N/A   Occupational History  . Not on file.   Social History Main Topics  . Smoking status: Former Smoker    Types: Cigarettes    Quit date: 05/19/2013  . Smokeless tobacco: Never Used     Comment:    . Alcohol use 0.0 oz/week  . Drug use: No  . Sexual activity: Not on file   Other Topics Concern  . Not on file   Social History Narrative   Lives alone.    Family History  Problem Relation Age of Onset  . Arthritis Mother   . Emphysema Mother   . Arthritis Father   . COPD Father   . Diabetes Other     ROS: no fevers or chills, productive cough, hemoptysis, dysphasia, odynophagia, melena,  hematochezia, dysuria, hematuria, rash, seizure activity, orthopnea, PND, pedal edema, claudication. Remaining systems are negative.  Physical Exam: Well-developed well-nourished in no acute distress.  Skin is warm and dry.  HEENT is normal.  Neck is supple.  Chest is clear to auscultation with normal expansion. Sternotomy without evidence of infection. Cardiovascular exam is regular rate and rhythm.  Abdominal exam nontender or distended. No masses palpated. Extremities show no edema. neuro grossly intact  ECG-Ventricular paced rhythm  A/P  1 Coronary artery disease-continue aspirin and statin.  2 hyperlipidemia-continue statin.  3 hypertension-blood pressure controlled. Continue present medications.  4 peripheral vascular disease-continue statin. Followed by vascular surgery.  5 prior pacemaker-followed by electrophysiology.  6 Paroxysmal atrial fibrillation-Continue beta blocker for rate control if atrial fibrillation recurs. Continue apixaban.   7 Tobacco abuse-Pt counseled on discontinuing.   8 carotid artery disease-continue aspirin and statin. Follow-up carotid  Dopplers November 2018.  Olga Millers, MD

## 2016-10-06 ENCOUNTER — Encounter: Payer: Self-pay | Admitting: Cardiology

## 2016-10-06 ENCOUNTER — Ambulatory Visit (INDEPENDENT_AMBULATORY_CARE_PROVIDER_SITE_OTHER): Payer: Medicare Other | Admitting: Cardiology

## 2016-10-06 VITALS — BP 126/76 | HR 60 | Ht 65.0 in | Wt 119.0 lb

## 2016-10-06 DIAGNOSIS — I4892 Unspecified atrial flutter: Secondary | ICD-10-CM

## 2016-10-06 DIAGNOSIS — I25810 Atherosclerosis of coronary artery bypass graft(s) without angina pectoris: Secondary | ICD-10-CM

## 2016-10-06 DIAGNOSIS — Z951 Presence of aortocoronary bypass graft: Secondary | ICD-10-CM | POA: Diagnosis not present

## 2016-10-06 DIAGNOSIS — I739 Peripheral vascular disease, unspecified: Secondary | ICD-10-CM

## 2016-10-06 DIAGNOSIS — I779 Disorder of arteries and arterioles, unspecified: Secondary | ICD-10-CM

## 2016-10-06 NOTE — Patient Instructions (Signed)
Your physician wants you to follow-up in: 6 MONTHS WITH DR CRENSHAW You will receive a reminder letter in the mail two months in advance. If you don't receive a letter, please call our office to schedule the follow-up appointment.   If you need a refill on your cardiac medications before your next appointment, please call your pharmacy.  

## 2016-10-19 ENCOUNTER — Telehealth: Payer: Self-pay | Admitting: *Deleted

## 2016-10-19 NOTE — Telephone Encounter (Signed)
Rehab phase 2 orders faxed

## 2016-10-31 NOTE — Progress Notes (Addendum)
Electrophysiology Office Note Date: 11/02/2016  ID:  REBECKAH [name], DOB 22-Nov-1955, MRN [id number]  PCP: Pincus Sanes, MD  Primary Cardiologist: Jens Som Electrophysiologist: Allred   CC: Pacemaker follow-up  Mercedes [name] is a 61 y.o. female is seen today for Dr Johney Frame.  She presents today for routine EP follow up.  Since last being seen in our clinic, the patient reports doing very well.  She denies chest pain, palpitations, dyspnea, PND, orthopnea, nausea, vomiting, dizziness, syncope.  She is largely unaware of her AF but does have occasional tachy-palpitations. She has quit smoking.   Device History: STJ leadless PPM implanted 10/2014 for tachy/brady syndrome   Past Medical History:  Diagnosis Date  . Abnormal LFTs    a. in the past when on Lipitor  . Anemia    as a young woman  . Anxiety   . Arnold-Chiari malformation, type I (HCC)    MRI brain 02/2010  . CAD (coronary artery disease)    a. NSTEMI 07/2009: LHC - D2 40%, LAD irreg., EF 50% with apical AK (tako-tsubo CM);  b. Inf STEMI (04/2013): Tx Promus DES to CFX;  c. 08/2013 Lexi CL: No ischemia, dist ant/ap/inf-ap infarct, EF  d. 06/2016 NSTEMI with LM disease: s/p CABG on 07/17/2016 w/ LIMA-LAD, SVG-OM, and SVG-dRCA.  [name] DEPRESSION   . Dizziness    ? CVA 01/2010 - carotid dopplers with no ICA stenosis  . GERD (gastroesophageal reflux disease)   . HYPERLIPIDEMIA   . HYPERTENSION   . MI (myocardial infarction)   . PAD (peripheral artery disease) (HCC)    s/p L fem pop 11/2013 in setting of 1st toe osteo/gangrene  . Paroxysmal atrial fibrillation (HCC)    a. anticoagulated with Eliquis 10/2014  . RA (rheumatoid arthritis) (HCC)    prior tx by Dr. Dareen Piano  . Sjogren's syndrome (HCC) 06/02/13   pt denies this (12/01/13)  . SLE (systemic lupus erythematosus) (HCC) dx 05/2013   follows with rheum Dareen Piano)  . Tachy-brady syndrome (HCC)    a. s/p STJ Leadless pacemaker 11-04-2014 Dr Johney Frame  . 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 ABDOMINAL AORTAGRAM N/A 11/24/2013   Procedure: ABDOMINAL AORTAGRAM WITH BILATERAL RUNOFF WITH POSSIBLE INTERVENTION;  Surgeon: Chuck Hint, MD;  Location: [facility] CATH LAB;  Service: Cardiovascular;  Laterality: N/A;  . AMPUTATION Left 12/02/2013   Procedure: LEFT 1ST TOE AMPUTATION;  Surgeon: Sherren Kerns, MD;  Location: [facility] OR;  Service: Vascular;  Laterality: Left;  . CARDIAC CATHETERIZATION N/A 07/12/2016   Procedure: Left Heart Cath and Coronary Angiography;  Surgeon: Lennette Bihari, MD;  Location: [facility] INVASIVE CV LAB;  Service: Cardiovascular;  Laterality: N/A;  . CARDIAC CATHETERIZATION N/A 07/14/2016   Procedure: Intravascular Ultrasound/IVUS;  Surgeon: Yvonne Kendall, MD;  Location: [facility] INVASIVE CV LAB;  Service: Cardiovascular;  Laterality: N/A;  . COLONOSCOPY    . CORONARY ANGIOPLASTY  04/2013  . CORONARY ARTERY BYPASS GRAFT N/A 07/17/2016   Procedure: CORONARY ARTERY BYPASS GRAFTING (CABG)x3 using right greater saphenous vein harvested endoscopiclly and left internal mammary artery;  Surgeon: Kerin Perna, MD;  Location: [facility] OR;  Service: Open Heart Surgery;  Laterality: N/A;  . FEMORAL-POPLITEAL BYPASS GRAFT Left 12/02/2013   Procedure:  LEFT FEMORAL-BELOW KNEE POPLITEAL ARTERY BYPASS GRAFT;  Surgeon: Sherren Kerns, MD;  Location: [facility] OR;  Service: Vascular;  Laterality: Left;  . LEFT HEART CATHETERIZATION WITH CORONARY ANGIOGRAM N/A 05/19/2013   Procedure: LEFT HEART CATHETERIZATION WITH CORONARY ANGIOGRAM;  Surgeon: Micheline Chapman, MD;  Location: [facility] CATH LAB;  Service: Cardiovascular;  Laterality: N/A;  . LEFT HEART CATHETERIZATION WITH CORONARY ANGIOGRAM N/A 10/28/2014   Procedure: LEFT HEART CATHETERIZATION WITH CORONARY ANGIOGRAM;  Surgeon: Iran Ouch, MD;  Location: [facility] CATH LAB;  Service: Cardiovascular;  Laterality: N/A;  .  PERCUTANEOUS CORONARY STENT INTERVENTION (PCI-S)  05/19/2013   Procedure: PERCUTANEOUS CORONARY STENT INTERVENTION (PCI-S);  Surgeon: Micheline Chapman, MD;  Location: [facility] CATH LAB;  Service: Cardiovascular;;  . PERMANENT PACEMAKER INSERTION N/A 11/04/2014   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  . TEE WITHOUT CARDIOVERSION N/A 07/17/2016   Procedure: TRANSESOPHAGEAL ECHOCARDIOGRAM (TEE);  Surgeon: Kerin Perna, MD;  Location: [facility] OR;  Service: Open Heart Surgery;  Laterality: N/A;  . TUBAL LIGATION      Current Outpatient Prescriptions  Medication Sig Dispense Refill  . acetaminophen (TYLENOL) 325 MG tablet Take 2 tablets (650 mg total) by mouth every 6 (six) hours as needed for mild pain. 30 tablet 0  . apixaban (ELIQUIS) 5 MG TABS tablet Take 1 tablet (5 mg total) by mouth 2 (two) times daily. 180 tablet 2  . aspirin EC 81 MG tablet Take 1 tablet (81 mg total) by mouth daily.    [name] ENSURE (ENSURE) Take 237 mLs by mouth daily.    [name] lisinopril (PRINIVIL,ZESTRIL) 5 MG tablet Take 1 tablet (5 mg total) by mouth daily. 90 tablet 2  . metoprolol tartrate (LOPRESSOR) 25 MG tablet Take 0.5 tablets (12.5 mg total) by mouth 2 (two) times daily. 90 tablet 2  . rosuvastatin (CRESTOR) 40 MG tablet Take 1 tablet (40 mg total) by mouth daily. 90 tablet 3   No current facility-administered medications for this visit.     Allergies:   Brilinta [ticagrelor]   Social History: Social History   Social History  . Marital status: Single    Spouse name: N/A  . Number of children: 3  . Years of education: N/A   Occupational History  . Not on file.   Social History Main Topics  . Smoking status: Former Smoker    Types: Cigarettes    Quit date: 05/19/2013  . Smokeless tobacco: Never Used     Comment:    . Alcohol use 0.0 oz/week  . Drug use: No  . Sexual activity: Not on file   Other Topics Concern  . Not on file   Social History Narrative   Lives alone.    Family  History: Family History  Problem Relation Age of Onset  . Arthritis Mother   . Emphysema Mother   . Arthritis Father   . COPD Father   . Diabetes Other      Review of Systems: All other systems reviewed and are otherwise negative except as noted above.   Physical Exam: VS:  BP (!) 150/98   Pulse 76   Ht 5\' 5"  (1.651 m)   Wt 121 lb 12.8 oz (55.2 kg)   BMI 20.27 kg/m  , BMI Body mass index is 20.27 kg/m.  GEN- The patient is well and thin appearing, alert and oriented x 3 today.   HEENT: normocephalic, atraumatic; sclera clear, conjunctiva pink; hearing intact; oropharynx clear; neck supple Lungs- Clear to ausculation bilaterally, normal work of breathing.  No wheezes, rales, rhonchi Heart- Regular rate and rhythm, no murmurs, rubs or  gallops GI- soft, non-tender, non-distended, bowel sounds present Extremities- no clubbing, cyanosis, or edema; DP/PT/radial pulses 2+ bilaterally MS- no significant deformity or atrophy Skin- warm and dry, no rash or lesion Psych- euthymic mood, full affect Neuro- strength and sensation are intact  PPM Interrogation- reviewed in detail today,  See PACEART report  EKG:  EKG is not ordered today.  Recent Labs: 07/18/2016: Magnesium 2.3 08/01/2016: B Natriuretic Peptide 115.9 09/29/2016: ALT 17; BUN 21; Creatinine, Ser 1.00; Hemoglobin 12.1; Platelets 375.0; Potassium 4.5; Sodium 137; TSH 0.25   Wt Readings from Last 3 Encounters:  11/02/16 121 lb 12.8 oz (55.2 kg)  10/06/16 119 lb (54 kg)  09/29/16 118 lb (53.5 kg)     Other studies Reviewed: Additional studies/ records that were reviewed today include:  Dr Jenel Lucks office note  Assessment and Plan:  1.  Tachy/brady syndrome Device was initially in back-up mode, reset with hardware restore by industry today RV threshold also increased today (was 0.5V today 2.0V).  Device is also at RRT.  See Arita Miss Art report  2.  Paroxysmal atrial fibrillation Continue Eliquis for CHADS2VASC score  of at least 4 She has been having some increased palpitations, I have advised ok to take extra Metoprolol when needed   3.  CAD No recent ischemic symptoms Continue current therapy She can participate in cardiac rehab at this point.     4.  Tobacco abuse She has quit smoking   5.  HTN Stable No change required today  Current medicines are reviewed at length with the patient today.   The patient does not have concerns regarding her medicines.  The following changes were made today:  none  Labs/ tests ordered today include: none  Disposition:   Follow up with Dr Johney Frame in 4 weeks   Signed, [name], NP 11/02/2016 12:38 PM  [facility] [address] [place] [address] [phone] (office) [phone] (fax)

## 2016-11-02 ENCOUNTER — Telehealth (HOSPITAL_COMMUNITY): Payer: Self-pay | Admitting: *Deleted

## 2016-11-02 ENCOUNTER — Ambulatory Visit (INDEPENDENT_AMBULATORY_CARE_PROVIDER_SITE_OTHER): Payer: Medicare Other | Admitting: Nurse Practitioner

## 2016-11-02 ENCOUNTER — Encounter: Payer: Self-pay | Admitting: Nurse Practitioner

## 2016-11-02 VITALS — BP 150/98 | HR 76 | Ht 65.0 in | Wt 121.8 lb

## 2016-11-02 DIAGNOSIS — I10 Essential (primary) hypertension: Secondary | ICD-10-CM

## 2016-11-02 DIAGNOSIS — I495 Sick sinus syndrome: Secondary | ICD-10-CM

## 2016-11-02 DIAGNOSIS — I480 Paroxysmal atrial fibrillation: Secondary | ICD-10-CM

## 2016-11-02 DIAGNOSIS — Z951 Presence of aortocoronary bypass graft: Secondary | ICD-10-CM | POA: Diagnosis not present

## 2016-11-02 LAB — CUP PACEART INCLINIC DEVICE CHECK
Implantable Pulse Generator Implant Date: 20160309
MDC IDC SESS DTM: 20180308120535

## 2016-11-02 NOTE — Patient Instructions (Addendum)
Medication Instructions:   Your physician recommends that you continue on your current medications as directed. Please refer to the Current Medication list given to you today.   If you need a refill on your cardiac medications before your next appointment, please call your pharmacy.  Labwork: NONE ORDERED  TODAY    Testing/Procedures: NONE ORDERED  TODAY    Follow-Up:  Your physician recommends that you schedule a follow-up appointment in: 4 weeks with Dr. Johney Frame  YOU HAVE BEEN REFERRED TO CARDIAC REHAB SOME WILL  CONTACT YOU FOR FURTHER STEPS  Any Other Special Instructions Will Be Listed Below (If Applicable).

## 2016-11-02 NOTE — Telephone Encounter (Signed)
Received notification from Md office that pt is ready to participate in cardiac rehab.  Pt called and message left for pt.  Contact information provided. Alanson Aly, BSN

## 2016-11-22 ENCOUNTER — Encounter: Payer: Self-pay | Admitting: Internal Medicine

## 2016-11-22 ENCOUNTER — Ambulatory Visit (INDEPENDENT_AMBULATORY_CARE_PROVIDER_SITE_OTHER): Payer: Medicare Other | Admitting: Internal Medicine

## 2016-11-22 VITALS — BP 124/76 | HR 76 | Ht 66.0 in | Wt 123.0 lb

## 2016-11-22 DIAGNOSIS — I10 Essential (primary) hypertension: Secondary | ICD-10-CM | POA: Diagnosis not present

## 2016-11-22 DIAGNOSIS — I495 Sick sinus syndrome: Secondary | ICD-10-CM | POA: Diagnosis not present

## 2016-11-22 DIAGNOSIS — Z951 Presence of aortocoronary bypass graft: Secondary | ICD-10-CM

## 2016-11-22 DIAGNOSIS — I480 Paroxysmal atrial fibrillation: Secondary | ICD-10-CM | POA: Diagnosis not present

## 2016-11-22 NOTE — Progress Notes (Signed)
Electrophysiology Office Note   Date:  11/22/2016   ID:  Mercedes [name], DOB Sep 09, 1955, MRN [id number]  PCP:  Pincus Sanes, MD  Cardiologist: Dr. Jens Som Primary Electrophysiologist: Dr. Johney Frame    Chief Complaint  Patient presents with  . Atrial Fibrillation     History of Present Illness: Mercedes [name] is a 61 y.o. female who presents today for electrophysiology evaluation .   She is s/p CABG since I saw her last.  Her leadless pacemaker has recently been found to have malfunction.  This is felt to be possibly related to her heart sugery.  She did not have MAZE or LAA resection according to the OP note.  Today, she denies symptoms of palpitations, chest pain, shortness of breath, orthopnea, PND, lower extremity edema, claudication, dizziness, presyncope, syncope, bleeding, or neurologic sequela.    Past Medical History:  Diagnosis Date  . Abnormal LFTs    a. in the past when on Lipitor  . Anemia    as a young woman  . Anxiety   . Arnold-Chiari malformation, type I (HCC)    MRI brain 02/2010  . CAD (coronary artery disease)    a. NSTEMI 07/2009: LHC - D2 40%, LAD irreg., EF 50% with apical AK (tako-tsubo CM);  b. Inf STEMI (04/2013): Tx Promus DES to CFX;  c. 08/2013 Lexi CL: No ischemia, dist ant/ap/inf-ap infarct, EF  d. 06/2016 NSTEMI with LM disease: s/p CABG on 07/17/2016 w/ LIMA-LAD, SVG-OM, and SVG-dRCA.  [name] DEPRESSION   . Dizziness    ? CVA 01/2010 - carotid dopplers with no ICA stenosis  . GERD (gastroesophageal reflux disease)   . HYPERLIPIDEMIA   . HYPERTENSION   . MI (myocardial infarction)   . PAD (peripheral artery disease) (HCC)    s/p L fem pop 11/2013 in setting of 1st toe osteo/gangrene  . Paroxysmal atrial fibrillation (HCC)    a. anticoagulated with Eliquis 10/2014  . RA (rheumatoid arthritis) (HCC)    prior tx by Dr. Dareen Piano  . Sjogren's syndrome (HCC) 06/02/13   pt denies this (12/01/13)  . SLE (systemic lupus erythematosus) (HCC) dx 05/2013   follows with rheum Dareen Piano)  . Tachy-brady syndrome (HCC)    a. s/p STJ Leadless pacemaker 11-04-2014 Dr Johney Frame  . 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 ABDOMINAL AORTAGRAM N/A 11/24/2013   Procedure: ABDOMINAL AORTAGRAM WITH BILATERAL RUNOFF WITH POSSIBLE INTERVENTION;  Surgeon: Chuck Hint, MD;  Location: [facility] CATH LAB;  Service: Cardiovascular;  Laterality: N/A;  . AMPUTATION Left 12/02/2013   Procedure: LEFT 1ST TOE AMPUTATION;  Surgeon: Sherren Kerns, MD;  Location: [facility] OR;  Service: Vascular;  Laterality: Left;  . CARDIAC CATHETERIZATION N/A 07/12/2016   Procedure: Left Heart Cath and Coronary Angiography;  Surgeon: Lennette Bihari, MD;  Location: [facility] INVASIVE CV LAB;  Service: Cardiovascular;  Laterality: N/A;  . CARDIAC CATHETERIZATION N/A 07/14/2016   Procedure: Intravascular Ultrasound/IVUS;  Surgeon: Yvonne Kendall, MD;  Location: [facility] INVASIVE CV LAB;  Service: Cardiovascular;  Laterality: N/A;  . COLONOSCOPY    . CORONARY ANGIOPLASTY  04/2013  . CORONARY ARTERY BYPASS GRAFT N/A 07/17/2016   Procedure: CORONARY ARTERY BYPASS GRAFTING (CABG)x3 using right greater saphenous vein harvested endoscopiclly and left internal mammary artery;  Surgeon: Kerin Perna, MD;  Location: [facility] OR;  Service: Open Heart Surgery;  Laterality: N/A;  .  FEMORAL-POPLITEAL BYPASS GRAFT Left 12/02/2013   Procedure:  LEFT FEMORAL-BELOW KNEE POPLITEAL ARTERY BYPASS GRAFT;  Surgeon: Sherren Kerns, MD;  Location: [facility] OR;  Service: Vascular;  Laterality: Left;  . LEFT HEART CATHETERIZATION WITH CORONARY ANGIOGRAM N/A 05/19/2013   Procedure: LEFT HEART CATHETERIZATION WITH CORONARY ANGIOGRAM;  Surgeon: Micheline Chapman, MD;  Location: [facility] CATH LAB;  Service: Cardiovascular;  Laterality: N/A;  . LEFT HEART CATHETERIZATION WITH CORONARY ANGIOGRAM N/A 10/28/2014   Procedure: LEFT HEART  CATHETERIZATION WITH CORONARY ANGIOGRAM;  Surgeon: Iran Ouch, MD;  Location: [facility] CATH LAB;  Service: Cardiovascular;  Laterality: N/A;  . PERCUTANEOUS CORONARY STENT INTERVENTION (PCI-S)  05/19/2013   Procedure: PERCUTANEOUS CORONARY STENT INTERVENTION (PCI-S);  Surgeon: Micheline Chapman, MD;  Location: [facility] CATH LAB;  Service: Cardiovascular;;  . PERMANENT PACEMAKER INSERTION N/A 11/04/2014   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  . TEE WITHOUT CARDIOVERSION N/A 07/17/2016   Procedure: TRANSESOPHAGEAL ECHOCARDIOGRAM (TEE);  Surgeon: Kerin Perna, MD;  Location: [facility] OR;  Service: Open Heart Surgery;  Laterality: N/A;  . TUBAL LIGATION       Current Outpatient Prescriptions  Medication Sig Dispense Refill  . acetaminophen (TYLENOL) 325 MG tablet Take 2 tablets (650 mg total) by mouth every 6 (six) hours as needed for mild pain. 30 tablet 0  . apixaban (ELIQUIS) 5 MG TABS tablet Take 1 tablet (5 mg total) by mouth 2 (two) times daily. 180 tablet 2  . aspirin EC 81 MG tablet Take 1 tablet (81 mg total) by mouth daily.    [name] ENSURE (ENSURE) Take 237 mLs by mouth daily.    [name] lisinopril (PRINIVIL,ZESTRIL) 5 MG tablet Take 1 tablet (5 mg total) by mouth daily. 90 tablet 2  . metoprolol tartrate (LOPRESSOR) 25 MG tablet Take 0.5 tablets (12.5 mg total) by mouth 2 (two) times daily. 90 tablet 2  . rosuvastatin (CRESTOR) 40 MG tablet Take 1 tablet (40 mg total) by mouth daily. 90 tablet 3   No current facility-administered medications for this visit.     Allergies:   Brilinta [ticagrelor]   Social History:  The patient  reports that she quit smoking about 3 years ago. Her smoking use included Cigarettes. She has never used smokeless tobacco. She reports that she drinks alcohol. She reports that she does not use drugs.   Family History:  The patient's family history includes Arthritis in her father and mother; COPD in her father; Diabetes in her other; Emphysema in her  mother.    ROS:  Please see the history of present illness.   All other systems are reviewed and negative.    PHYSICAL EXAM: VS:  BP 124/76   Pulse 76   Ht 5\' 6"  (1.676 m)   Wt 123 lb (55.8 kg)   SpO2 97%   BMI 19.85 kg/m  , BMI Body mass index is 19.85 kg/m. GEN: Well nourished, well developed, in no acute distress  HEENT: normal  Neck: no JVD, carotid bruits, or masses Cardiac: RRR with ecopty; no murmurs, rubs, or gallops,no edema  Respiratory:  clear to auscultation bilaterally, normal work of breathing GI: soft, nontender, nondistended, + BS MS: no deformity or atrophy  Skin: warm and dry  Neuro:  Strength and sensation are intact Psych: euthymic mood, full affect  EKG:   The ekg ordered today SR 76 bpm, incomplete RBBB, LAD, PACs  Lipid Panel     Component Value Date/Time   CHOL 146 07/12/2016 0442  TRIG 66 07/12/2016 0442   HDL 47 07/12/2016 0442   CHOLHDL 3.1 07/12/2016 0442   VLDL 13 07/12/2016 0442   LDLCALC 86 07/12/2016 0442     Wt Readings from Last 3 Encounters:  11/22/16 123 lb (55.8 kg)  11/02/16 121 lb 12.8 oz (55.2 kg)  10/06/16 119 lb (54 kg)     ASSESSMENT AND PLAN:  1.  Tachy/brady syndrome     Leadless pacemaker is at RRT but is working.  Per SJM this may be due to recent CABG.  Will continue to follow conservatively at this time.  She is not device dependant and rarely paces.  2. PAF     Eliquis     Does not appear that she had MAZE or LAA amputation at time of her recent CABG.  I think that this could have been very beneficial for her.  May need to consider ablation in the future.  3. CAD     I have encouraged cardiac rehab.  No changes at this time.  4. HTN     Stable No change required today    Return to see me in 3 months  Signed, Hillis Range, MD  11/22/2016 11:31 AM     [facility] [address] [place] [address] [phone] (office) [phone] (fax)

## 2016-11-22 NOTE — Patient Instructions (Signed)
Medication Instructions:  None Ordered   Labwork: None Ordered   Testing/Procedures: None Ordered   Follow-Up: Your physician recommends that you schedule a follow-up appointment in: 3 months with Dr. Allred  Any Other Special Instructions Will Be Listed Below (If Applicable).     If you need a refill on your cardiac medications before your next appointment, please call your pharmacy.   

## 2016-11-30 ENCOUNTER — Encounter (HOSPITAL_COMMUNITY)
Admission: RE | Admit: 2016-11-30 | Discharge: 2016-11-30 | Disposition: A | Discharge status: Home / Self Care | Payer: Medicare Other | Source: Ambulatory Visit | Attending: Cardiology | Admitting: Cardiology

## 2016-11-30 ENCOUNTER — Encounter (HOSPITAL_COMMUNITY): Payer: Self-pay

## 2016-11-30 VITALS — BP 142/90 | HR 85 | Ht 65.5 in | Wt 123.5 lb

## 2016-11-30 DIAGNOSIS — Z48812 Encounter for surgical aftercare following surgery on the circulatory system: Secondary | ICD-10-CM | POA: Insufficient documentation

## 2016-11-30 DIAGNOSIS — I214 Non-ST elevation (NSTEMI) myocardial infarction: Secondary | ICD-10-CM | POA: Diagnosis present

## 2016-11-30 DIAGNOSIS — Z951 Presence of aortocoronary bypass graft: Secondary | ICD-10-CM | POA: Insufficient documentation

## 2016-11-30 NOTE — Progress Notes (Signed)
Cardiac Individual Treatment Plan  Patient Details  Name: Mercedes [name] MRN: [id number] Date of Birth: May 05, 1956 Referring Provider:     CARDIAC REHAB PHASE II ORIENTATION from 11/30/2016 in [facility] CARDIAC REHAB  Referring Provider  Olga Millers MD      Initial Encounter Date:    CARDIAC REHAB PHASE II ORIENTATION from 11/30/2016 in [facility] CARDIAC REHAB  Date  11/30/16  Referring Provider  Olga Millers MD      Visit Diagnosis: 06/2016 NSTEMI (non-ST elevated myocardial infarction) (HCC)  07/17/16 S/P CABG x 3  Patient's Home Medications on Admission:  Current Outpatient Prescriptions:  .  acetaminophen (TYLENOL) 325 MG tablet, Take 2 tablets (650 mg total) by mouth every 6 (six) hours as needed for mild pain., Disp: 30 tablet, Rfl: 0 .  apixaban (ELIQUIS) 5 MG TABS tablet, Take 1 tablet (5 mg total) by mouth 2 (two) times daily., Disp: 180 tablet, Rfl: 2 .  aspirin EC 81 MG tablet, Take 1 tablet (81 mg total) by mouth daily., Disp: , Rfl:  .  ENSURE (ENSURE), Take 237 mLs by mouth daily., Disp: , Rfl:  .  lisinopril (PRINIVIL,ZESTRIL) 5 MG tablet, Take 1 tablet (5 mg total) by mouth daily., Disp: 90 tablet, Rfl: 2 .  metoprolol tartrate (LOPRESSOR) 25 MG tablet, Take 0.5 tablets (12.5 mg total) by mouth 2 (two) times daily., Disp: 90 tablet, Rfl: 2 .  rosuvastatin (CRESTOR) 40 MG tablet, Take 1 tablet (40 mg total) by mouth daily., Disp: 90 tablet, Rfl: 3  Past Medical History: Past Medical History:  Diagnosis Date  . Abnormal LFTs    a. in the past when on Lipitor  . Anemia    as a young woman  . Anxiety   . Arnold-Chiari malformation, type I (HCC)    MRI brain 02/2010  . CAD (coronary artery disease)    a. NSTEMI 07/2009: LHC - D2 40%, LAD irreg., EF 50% with apical AK (tako-tsubo CM);  b. Inf STEMI (04/2013): Tx Promus DES to CFX;  c. 08/2013 Lexi CL: No ischemia, dist ant/ap/inf-ap infarct, EF  d. 06/2016 NSTEMI with LM  disease: s/p CABG on 07/17/2016 w/ LIMA-LAD, SVG-OM, and SVG-dRCA.  [name] DEPRESSION   . Dizziness    ? CVA 01/2010 - carotid dopplers with no ICA stenosis  . GERD (gastroesophageal reflux disease)   . HYPERLIPIDEMIA   . HYPERTENSION   . MI (myocardial infarction)   . PAD (peripheral artery disease) (HCC)    s/p L fem pop 11/2013 in setting of 1st toe osteo/gangrene  . Paroxysmal atrial fibrillation (HCC)    a. anticoagulated with Eliquis 10/2014  . RA (rheumatoid arthritis) (HCC)    prior tx by Dr. Dareen Piano  . Sjogren's syndrome (HCC) 06/02/13   pt denies this (12/01/13)  . SLE (systemic lupus erythematosus) (HCC) dx 05/2013   follows with rheum Dareen Piano)  . Tachy-brady syndrome (HCC)    a. s/p STJ Leadless pacemaker 11-04-2014 Dr Johney Frame  . 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 Former Smoker  . Types: Cigarettes  . Quit date: 05/19/2016  Smokeless Tobacco  . Never Used    Comment:      Labs: Recent Review Flowsheet Data    Labs for ITP Cardiac and Pulmonary Rehab Latest Ref Rng & Units 07/17/2016 07/17/2016 07/17/2016 07/18/2016 09/29/2016   Cholestrol  0 - 200 mg/dL - - - - -   LDLCALC 0 - 99 mg/dL - - - - -   HDL >45 mg/dL - - - - -   Trlycerides <150 mg/dL - - - - -   Hemoglobin A1c 4.6 - 6.5 % - - - - 6.2   PHART 7.350 - 7.450 - 7.288(L) 7.384 - -   PCO2ART 32.0 - 48.0 mmHg - 40.7 40.1 - -   HCO3 20.0 - 28.0 mmol/L - 19.6(L) 24.1 - -   TCO2 0 - 100 mmol/L 24 21 25 25  -   ACIDBASEDEF 0.0 - 2.0 mmol/L - 7.0(H) 1.0 - -   O2SAT % - 94.0 92.0 - -      Capillary Blood Glucose: Lab Results  Component Value Date   GLUCAP 134 (H) 07/20/2016   GLUCAP 136 (H) 07/20/2016   GLUCAP 96 07/20/2016   GLUCAP 118 (H) 07/20/2016   GLUCAP 122 (H) 07/19/2016     Exercise Target Goals: Date: 11/30/16  Exercise Program Goal: Individual exercise prescription set with THRR,  safety & activity barriers. Participant demonstrates ability to understand and report RPE using BORG scale, to self-measure pulse accurately, and to acknowledge the importance of the exercise prescription.  Exercise Prescription Goal: Starting with aerobic activity 30 plus minutes a day, 3 days per week for initial exercise prescription. Provide home exercise prescription and guidelines that participant acknowledges understanding prior to discharge.  Activity Barriers & Risk Stratification:     Activity Barriers & Cardiac Risk Stratification - 11/30/16 0906      Activity Barriers & Cardiac Risk Stratification   Activity Barriers Deconditioning;Muscular Weakness;Arthritis;Other (comment)   Comments poor circulation on L leg, L big toe removal    Cardiac Risk Stratification High      6 Minute Walk:     6 Minute Walk    Row Name 11/30/16 1347         6 Minute Walk   Phase Initial     Distance 1432 feet     Walk Time 6 minutes     # of Rest Breaks 0     MPH 2.7     METS 4.2     RPE 15     VO2 Peak 14.9     Symptoms Yes (comment)     Comments right calf pain 1/4     Resting HR 85 bpm     Resting BP 142/90     Max Ex. HR 101 bpm     Max Ex. BP 170/98     2 Minute Post BP 166/90        Oxygen Initial Assessment:   Oxygen Re-Evaluation:   Oxygen Discharge (Final Oxygen Re-Evaluation):   Initial Exercise Prescription:     Initial Exercise Prescription - 11/30/16 1300      Date of Initial Exercise RX and Referring Provider   Date 11/30/16   Referring Provider Olga Millers MD     Treadmill   MPH 2.8   Grade 0   Minutes 10   METs 2.53     Recumbant Bike   Level 2   Minutes 10   METs 2.9     NuStep   Level 2   Minutes 10   METs 2.4     Prescription Details   Frequency (times per week) 5   Duration Progress to 45 minutes of aerobic exercise without signs/symptoms of physical distress     Intensity   THRR 40-80% of  Max Heartrate [phone]   Ratings  of Perceived Exertion 11-13   Perceived Dyspnea 0-4     Progression   Progression Continue progressive overload as per policy without signs/symptoms or physical distress.     Resistance Training   Training Prescription Yes   Weight 2   Reps 10-15      Perform Capillary Blood Glucose checks as needed.  Exercise Prescription Changes:   Exercise Comments:   Exercise Goals and Review:     Exercise Goals    Row Name 11/30/16 0909             Exercise Goals   Increase Physical Activity Yes       Intervention Provide advice, education, support and counseling about physical activity/exercise needs.;Develop an individualized exercise prescription for aerobic and resistive training based on initial evaluation findings, risk stratification, comorbidities and participant's personal goals.       Expected Outcomes Achievement of increased cardiorespiratory fitness and enhanced flexibility, muscular endurance and strength shown through measurements of functional capacity and personal statement of participant.       Increase Strength and Stamina Yes  increase breathing capacity and increase exercise capacity.        Intervention Provide advice, education, support and counseling about physical activity/exercise needs.;Develop an individualized exercise prescription for aerobic and resistive training based on initial evaluation findings, risk stratification, comorbidities and participant's personal goals.       Expected Outcomes Achievement of increased cardiorespiratory fitness and enhanced flexibility, muscular endurance and strength shown through measurements of functional capacity and personal statement of participant.          Exercise Goals Re-Evaluation :    Discharge Exercise Prescription (Final Exercise Prescription Changes):   Nutrition:  Target Goals: Understanding of nutrition guidelines, daily intake of sodium 1500mg , cholesterol 200mg , calories 30% from fat and 7% or  less from saturated fats, daily to have 5 or more servings of fruits and vegetables.  Biometrics:     Pre Biometrics - 11/30/16 1349      Pre Biometrics   Height 5' 5.5" (1.664 m)   Weight 123 lb 7.3 oz (56 kg)   Waist Circumference 28 inches   Hip Circumference 35.5 inches   Waist to Hip Ratio 0.79 %   BMI (Calculated) 20.3   Triceps Skinfold 20 mm   % Body Fat 30.3 %   Grip Strength 36 kg   Flexibility 11 in   Single Leg Stand 8.09 seconds       Nutrition Therapy Plan and Nutrition Goals:   Nutrition Discharge: Nutrition Scores:   Nutrition Goals Re-Evaluation:   Nutrition Goals Re-Evaluation:   Nutrition Goals Discharge (Final Nutrition Goals Re-Evaluation):   Psychosocial: Target Goals: Acknowledge presence or absence of significant depression and/or stress, maximize coping skills, provide positive support system. Participant is able to verbalize types and ability to use techniques and skills needed for reducing stress and depression.  Initial Review & Psychosocial Screening:     Initial Psych Review & Screening - 11/30/16 1823      Initial Review   Current issues with Current Stress Concerns   Source of Stress Concerns Chronic Illness   Comments rates low level of stress regarding health     Family Dynamics   Good Support System? Yes      Quality of Life Scores:     Quality of Life - 11/30/16 1350      Quality of Life Scores   Health/Function Pre 19.93 %  Socioeconomic Pre 22.9 %   Psych/Spiritual Pre 24.43 %   Family Pre 25.5 %   GLOBAL Pre 22.15 %      PHQ-9: Recent Review Flowsheet Data    There is no flowsheet data to display.     Interpretation of Total Score  Total Score Depression Severity:  1-4 = Minimal depression, 5-9 = Mild depression, 10-14 = Moderate depression, 15-19 = Moderately severe depression, 20-27 = Severe depression   Psychosocial Evaluation and Intervention:   Psychosocial Re-Evaluation:   Psychosocial  Discharge (Final Psychosocial Re-Evaluation):   Vocational Rehabilitation: Provide vocational rehab assistance to qualifying candidates.   Vocational Rehab Evaluation & Intervention:   Education: Education Goals: Education classes will be provided on a weekly basis, covering required topics. Participant will state understanding/return demonstration of topics presented.  Learning Barriers/Preferences:     Learning Barriers/Preferences - 11/30/16 1610      Learning Barriers/Preferences   Learning Barriers Sight   Learning Preferences Written Material      Education Topics: Count Your Pulse:  -Group instruction provided by verbal instruction, demonstration, patient participation and written materials to support subject.  Instructors address importance of being able to find your pulse and how to count your pulse when at home without a heart monitor.  Patients get hands on experience counting their pulse with staff help and individually.   Heart Attack, Angina, and Risk Factor Modification:  -Group instruction provided by verbal instruction, video, and written materials to support subject.  Instructors address signs and symptoms of angina and heart attacks.    Also discuss risk factors for heart disease and how to make changes to improve heart health risk factors.   Functional Fitness:  -Group instruction provided by verbal instruction, demonstration, patient participation, and written materials to support subject.  Instructors address safety measures for doing things around the house.  Discuss how to get up and down off the floor, how to pick things up properly, how to safely get out of a chair without assistance, and balance training.   Meditation and Mindfulness:  -Group instruction provided by verbal instruction, patient participation, and written materials to support subject.  Instructor addresses importance of mindfulness and meditation practice to help reduce stress and improve  awareness.  Instructor also leads participants through a meditation exercise.    Stretching for Flexibility and Mobility:  -Group instruction provided by verbal instruction, patient participation, and written materials to support subject.  Instructors lead participants through series of stretches that are designed to increase flexibility thus improving mobility.  These stretches are additional exercise for major muscle groups that are typically performed during regular warm up and cool down.   Hands Only CPR Anytime:  -Group instruction provided by verbal instruction, video, patient participation and written materials to support subject.  Instructors co-teach with AHA video for hands only CPR.  Participants get hands on experience with mannequins.   Nutrition I class: Heart Healthy Eating:  -Group instruction provided by PowerPoint slides, verbal discussion, and written materials to support subject matter. The instructor gives an explanation and review of the Therapeutic Lifestyle Changes diet recommendations, which includes a discussion on lipid goals, dietary fat, sodium, fiber, plant stanol/sterol esters, sugar, and the components of a well-balanced, healthy diet.   Nutrition II class: Lifestyle Skills:  -Group instruction provided by PowerPoint slides, verbal discussion, and written materials to support subject matter. The instructor gives an explanation and review of label reading, grocery shopping for heart health, heart healthy recipe modifications, and ways  to make healthier choices when eating out.   Diabetes Question & Answer:  -Group instruction provided by PowerPoint slides, verbal discussion, and written materials to support subject matter. The instructor gives an explanation and review of diabetes co-morbidities, pre- and post-prandial blood glucose goals, pre-exercise blood glucose goals, signs, symptoms, and treatment of hypoglycemia and hyperglycemia, and foot care  basics.   Diabetes Blitz:  -Group instruction provided by PowerPoint slides, verbal discussion, and written materials to support subject matter. The instructor gives an explanation and review of the physiology behind type 1 and type 2 diabetes, diabetes medications and rational behind using different medications, pre- and post-prandial blood glucose recommendations and Hemoglobin A1c goals, diabetes diet, and exercise including blood glucose guidelines for exercising safely.    Portion Distortion:  -Group instruction provided by PowerPoint slides, verbal discussion, written materials, and food models to support subject matter. The instructor gives an explanation of serving size versus portion size, changes in portions sizes over the last 20 years, and what consists of a serving from each food group.   Stress Management:  -Group instruction provided by verbal instruction, video, and written materials to support subject matter.  Instructors review role of stress in heart disease and how to cope with stress positively.     Exercising on Your Own:  -Group instruction provided by verbal instruction, power point, and written materials to support subject.  Instructors discuss benefits of exercise, components of exercise, frequency and intensity of exercise, and end points for exercise.  Also discuss use of nitroglycerin and activating EMS.  Review options of places to exercise outside of rehab.  Review guidelines for sex with heart disease.   Cardiac Drugs I:  -Group instruction provided by verbal instruction and written materials to support subject.  Instructor reviews cardiac drug classes: antiplatelets, anticoagulants, beta blockers, and statins.  Instructor discusses reasons, side effects, and lifestyle considerations for each drug class.   Cardiac Drugs II:  -Group instruction provided by verbal instruction and written materials to support subject.  Instructor reviews cardiac drug classes:  angiotensin converting enzyme inhibitors (ACE-I), angiotensin II receptor blockers (ARBs), nitrates, and calcium channel blockers.  Instructor discusses reasons, side effects, and lifestyle considerations for each drug class.   Anatomy and Physiology of the Circulatory System:  -Group instruction provided by verbal instruction, video, and written materials to support subject.  Reviews functional anatomy of heart, how it relates to various diagnoses, and what role the heart plays in the overall system.   Knowledge Questionnaire Score:     Knowledge Questionnaire Score - 11/30/16 1345      Knowledge Questionnaire Score   Pre Score 18/24      Core Components/Risk Factors/Patient Goals at Admission:     Personal Goals and Risk Factors at Admission - 11/30/16 1124      Core Components/Risk Factors/Patient Goals on Admission   Lipids Yes   Intervention Provide education and support for participant on nutrition & aerobic/resistive exercise along with prescribed medications to achieve LDL 70mg , HDL >40mg .   Expected Outcomes Long Term: Cholesterol controlled with medications as prescribed, with individualized exercise RX and with personalized nutrition plan. Value goals: LDL < 70mg , HDL > 40 mg.;Short Term: Participant states understanding of desired cholesterol values and is compliant with medications prescribed. Participant is following exercise prescription and nutrition guidelines.      Core Components/Risk Factors/Patient Goals Review:    Core Components/Risk Factors/Patient Goals at Discharge (Final Review):    ITP Comments:  ITP Comments    Row Name 11/30/16 0857           ITP Comments Medical Director, Dr. Armanda Magic          Comments:  Pt in today for phase II cardiac rehab orientation appt. From 0800-1030.  As a part of the orientation appt., pt completed 5 minutes of warm stretches and 6 minute walk test.  Pt with slightly elevated bp prior to ambulation. Pt  had not taken her medications this morning. Pt takes her am medications around 10:30. Pt bp did decrease within acceptable range to proceed with walk test. Pt denies any cp or sob but did have some calf discomfort that went away after ambulation stopped. Monitor showed SR with occasional paced beat, neg QRS and rare PVC.  Brief Psychosocial Assessment reveals no barriers to participating in cardiac rehab. Pt is looking forward to starting full exercise next week. Alanson Aly, BSN Cardiac and Emergency planning/management officer

## 2016-11-30 NOTE — Progress Notes (Signed)
Cardiac Rehab Medication Review by a Pharmacist  Does the patient  feel that his/her medications are working for him/her?  yes  Has the patient been experiencing any side effects to the medications prescribed?  no  Does the patient measure his/her own blood pressure or blood glucose at home?  no   Does the patient have any problems obtaining medications due to transportation or finances?   no  Understanding of regimen: good Understanding of indications: fair Potential of compliance: good   Pharmacist comments: Pt presents for initial cardiac rehab appointment. Pt endorses no issues at this time, but reports she is unable to afford BP monitor. Encouraged pt to check at pharmacy whenever possible. No other issues identified.  Fredonia Highland, PharmD PGY-1 Pharmacy Resident Pager: [phone] 11/30/2016

## 2016-12-04 ENCOUNTER — Encounter (HOSPITAL_COMMUNITY): Payer: Self-pay

## 2016-12-04 ENCOUNTER — Encounter (HOSPITAL_COMMUNITY): Payer: Medicare Other

## 2016-12-05 ENCOUNTER — Telehealth: Payer: Self-pay | Admitting: Internal Medicine

## 2016-12-05 DIAGNOSIS — E785 Hyperlipidemia, unspecified: Secondary | ICD-10-CM

## 2016-12-05 MED ORDER — NITROGLYCERIN 0.4 MG SL SUBL: 0.4000 mg | SUBLINGUAL_TABLET | SUBLINGUAL | 3 refills | Status: DC | PRN

## 2016-12-05 MED ORDER — ROSUVASTATIN CALCIUM 40 MG PO TABS: 20.0000 mg | ORAL_TABLET | Freq: Every day | ORAL | 3 refills | Status: DC

## 2016-12-05 NOTE — Telephone Encounter (Signed)
New Message  Pt voiced she would like to speak with nurse.  Please f/u

## 2016-12-05 NOTE — Telephone Encounter (Signed)
Patient is requesting a prescription for nitro. Patient also stated that she feels that Crestor is zapping her energy. Patient requesting is she can take a lower dose. Will forward to Dr. Jens Som and his nurse.

## 2016-12-05 NOTE — Telephone Encounter (Signed)
Spoke with pt, Aware of dr crenshaw's recommendations.  Lab orders mailed to the pt  

## 2016-12-05 NOTE — Addendum Note (Signed)
Addended by: Freddi Starr on: 12/05/2016 03:28 PM   Modules accepted: Orders

## 2016-12-05 NOTE — Telephone Encounter (Signed)
Ok for NTG prescription Change crestor to 20 mg daily; lipids and liver 4 weeks Olga Millers

## 2016-12-06 ENCOUNTER — Encounter (HOSPITAL_COMMUNITY): Payer: Medicare Other

## 2016-12-06 ENCOUNTER — Telehealth (HOSPITAL_COMMUNITY): Payer: Self-pay | Admitting: Internal Medicine

## 2016-12-08 ENCOUNTER — Encounter (HOSPITAL_COMMUNITY)
Admission: RE | Admit: 2016-12-08 | Discharge: 2016-12-08 | Disposition: A | Discharge status: Home / Self Care | Payer: Medicare Other | Source: Ambulatory Visit | Attending: Cardiology | Admitting: Cardiology

## 2016-12-08 DIAGNOSIS — I214 Non-ST elevation (NSTEMI) myocardial infarction: Secondary | ICD-10-CM

## 2016-12-08 DIAGNOSIS — Z48812 Encounter for surgical aftercare following surgery on the circulatory system: Secondary | ICD-10-CM | POA: Diagnosis not present

## 2016-12-08 DIAGNOSIS — Z951 Presence of aortocoronary bypass graft: Secondary | ICD-10-CM

## 2016-12-08 NOTE — Progress Notes (Signed)
Daily Session Note  Patient Details  Name: Mercedes [name] MRN: [id number] Date of Birth: February 17, 1956 Referring Provider:     CARDIAC REHAB PHASE II ORIENTATION from 11/30/2016 in [place]  Referring Provider  Kirk Ruths MD      Encounter Date: 12/08/2016  Check In:     Session Check In - 12/08/16 1508      Check-In   Location [facility]   Staff Present Su Hilt, MS, ACSM RCEP, Exercise Physiologist;Ashley Armstrong, MS, Exercise Physiologist;Merlean Pizzini, RN, BSN   Supervising physician immediately available to respond to emergencies [facility] immediately available   Physician(s) Dr. Cathlean Sauer   Medication changes reported     No   Fall or balance concerns reported    No   Tobacco Cessation No Change   Warm-up and Cool-down Performed as group-led instruction   Resistance Training Performed Yes   VAD Patient? No     Pain Assessment   Currently in Pain? No/denies   Multiple Pain Sites No      Capillary Blood Glucose: No results found for this or any previous visit (from the past 24 hour(s)).    History  Smoking Status  . Former Smoker  . Types: Cigarettes  . Quit date: 05/19/2016  Smokeless Tobacco  . Never Used    Comment:      Goals Met:  Exercise tolerated well  Goals Unmet:  Not Applicable  Comments: [name] started cardiac rehab today.  Pt tolerated light exercise without difficulty. VSS, telemetry-Sinus Rhythm, asymptomatic.  Medication list reconciled. Pt denies barriers to medicaiton compliance.  PSYCHOSOCIAL ASSESSMENT:  PHQ-0. Pt exhibits positive coping skills, hopeful outlook with supportive family. No psychosocial needs identified at this time, no psychosocial interventions necessary.    [name] enjoys working out in the yard.   Pt oriented to exercise equipment and routine.    Understanding verbalized.Barnet Pall, RN,BSN 12/08/2016 4:21 PM   Dr. Fransico Him is Medical  Director for Cardiac Rehab at [facility].

## 2016-12-11 ENCOUNTER — Encounter (HOSPITAL_COMMUNITY)
Admission: RE | Admit: 2016-12-11 | Discharge: 2016-12-11 | Disposition: A | Discharge status: Home / Self Care | Payer: Medicare Other | Source: Ambulatory Visit | Attending: Cardiology | Admitting: Cardiology

## 2016-12-11 DIAGNOSIS — Z48812 Encounter for surgical aftercare following surgery on the circulatory system: Secondary | ICD-10-CM | POA: Diagnosis not present

## 2016-12-11 DIAGNOSIS — Z951 Presence of aortocoronary bypass graft: Secondary | ICD-10-CM

## 2016-12-11 DIAGNOSIS — I214 Non-ST elevation (NSTEMI) myocardial infarction: Secondary | ICD-10-CM

## 2016-12-13 ENCOUNTER — Encounter (HOSPITAL_COMMUNITY): Admission: RE | Admit: 2016-12-13 | Payer: Medicare Other | Source: Ambulatory Visit

## 2016-12-13 ENCOUNTER — Encounter (HOSPITAL_COMMUNITY)
Admission: RE | Admit: 2016-12-13 | Discharge: 2016-12-13 | Disposition: A | Discharge status: Home / Self Care | Payer: Medicare Other | Source: Ambulatory Visit | Attending: Cardiology | Admitting: Cardiology

## 2016-12-13 DIAGNOSIS — Z48812 Encounter for surgical aftercare following surgery on the circulatory system: Secondary | ICD-10-CM | POA: Diagnosis not present

## 2016-12-13 DIAGNOSIS — I214 Non-ST elevation (NSTEMI) myocardial infarction: Secondary | ICD-10-CM

## 2016-12-13 DIAGNOSIS — Z951 Presence of aortocoronary bypass graft: Secondary | ICD-10-CM

## 2016-12-15 ENCOUNTER — Encounter (HOSPITAL_COMMUNITY)
Admission: RE | Admit: 2016-12-15 | Discharge: 2016-12-15 | Disposition: A | Discharge status: Home / Self Care | Payer: Medicare Other | Source: Ambulatory Visit | Attending: Cardiology | Admitting: Cardiology

## 2016-12-15 ENCOUNTER — Encounter (HOSPITAL_COMMUNITY): Payer: Medicare Other

## 2016-12-18 ENCOUNTER — Telehealth (HOSPITAL_COMMUNITY): Payer: Self-pay | Admitting: Internal Medicine

## 2016-12-18 ENCOUNTER — Encounter (HOSPITAL_COMMUNITY): Payer: Medicare Other

## 2016-12-19 ENCOUNTER — Encounter (HOSPITAL_COMMUNITY): Payer: Self-pay | Admitting: *Deleted

## 2016-12-19 DIAGNOSIS — I214 Non-ST elevation (NSTEMI) myocardial infarction: Secondary | ICD-10-CM

## 2016-12-19 DIAGNOSIS — Z951 Presence of aortocoronary bypass graft: Secondary | ICD-10-CM

## 2016-12-19 NOTE — Progress Notes (Signed)
Cardiac Individual Treatment Plan  Patient Details  Name: Mercedes [name] MRN: [id number] Date of Birth: [birth year] Referring Provider:     CARDIAC REHAB PHASE II ORIENTATION from 11/30/2016 in [place]  Referring Provider  Kirk Ruths MD      Initial Encounter Date:    CARDIAC REHAB PHASE II ORIENTATION from 11/30/2016 in [place]  Date  11/30/16  Referring Provider  Kirk Ruths MD      Visit Diagnosis: 06/2016 NSTEMI (non-ST elevated myocardial infarction) ([place])  07/17/16 S/P CABG x 3  Patient's Home Medications on Admission:  Current Outpatient Prescriptions:  .  acetaminophen (TYLENOL) 325 MG tablet, Take 2 tablets (650 mg total) by mouth every 6 (six) hours as needed for mild pain., Disp: 30 tablet, Rfl: 0 .  apixaban (ELIQUIS) 5 MG TABS tablet, Take 1 tablet (5 mg total) by mouth 2 (two) times daily., Disp: 180 tablet, Rfl: 2 .  aspirin EC 81 MG tablet, Take 1 tablet (81 mg total) by mouth daily., Disp: , Rfl:  .  ENSURE (ENSURE), Take 237 mLs by mouth daily., Disp: , Rfl:  .  lisinopril (PRINIVIL,ZESTRIL) 5 MG tablet, Take 1 tablet (5 mg total) by mouth daily., Disp: 90 tablet, Rfl: 2 .  metoprolol tartrate (LOPRESSOR) 25 MG tablet, Take 0.5 tablets (12.5 mg total) by mouth 2 (two) times daily., Disp: 90 tablet, Rfl: 2 .  nitroGLYCERIN (NITROSTAT) 0.4 MG SL tablet, Place 1 tablet (0.4 mg total) under the tongue every 5 (five) minutes as needed for chest pain., Disp: 90 tablet, Rfl: 3 .  rosuvastatin (CRESTOR) 40 MG tablet, Take 0.5 tablets (20 mg total) by mouth daily., Disp: 90 tablet, Rfl: 3  Past Medical History: Past Medical History:  Diagnosis Date  . Abnormal LFTs    a. in the past when on Lipitor  . Anemia    as a young woman  . Anxiety   . Arnold-Chiari malformation, type I ([place])    MRI brain 02/2010  . CAD (coronary artery disease)    a. NSTEMI 07/2009: LHC - D2 40%, LAD irreg., EF 50%  with apical AK (tako-tsubo CM);  b. Inf STEMI (04/2013): Tx Promus DES to CFX;  c. 08/2013 Lexi CL: No ischemia, dist ant/ap/inf-ap infarct, EF  d. 06/2016 NSTEMI with LM disease: s/p CABG on 07/17/2016 w/ LIMA-LAD, SVG-OM, and SVG-dRCA.  [name] DEPRESSION   . Dizziness    ? CVA 01/2010 - carotid dopplers with no ICA stenosis  . GERD (gastroesophageal reflux disease)   . HYPERLIPIDEMIA   . HYPERTENSION   . MI (myocardial infarction) ([place])   . PAD (peripheral artery disease) ([employer])    s/p L fem pop 11/2013 in setting of 1st toe osteo/gangrene  . Paroxysmal atrial fibrillation (H[facility])    a. anticoagulated with Eliquis 10/2014  . RA (rheumatoid arthritis) ([place])    prior tx by Dr. Ouida Sills  . Sjogren's syndrome ([place]) 06/02/13   pt denies this (12/01/13)  . SLE (systemic lupus erythematosus) ([place]) dx 05/2013   follows with rheum Ouida Sills)  . Tachy-brady syndrome (HCC)    a. s/p STJ Leadless pacemaker 11-04-2014 Dr Rayann Heman  . 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 Former Smoker  . Types: Cigarettes  . Quit date: 05/19/2016  Smokeless Tobacco  . Never Used    Comment:  Labs: Recent Review Flowsheet Data    Labs for ITP Cardiac and Pulmonary Rehab Latest Ref Rng & Units 07/17/2016 07/17/2016 07/17/2016 07/18/2016 09/29/2016   Cholestrol 0 - 200 mg/dL - - - - -   LDLCALC 0 - 99 mg/dL - - - - -   HDL >40 mg/dL - - - - -   Trlycerides <150 mg/dL - - - - -   Hemoglobin A1c 4.6 - 6.5 % - - - - 6.2   PHART 7.350 - 7.450 - 7.288(L) 7.384 - -   PCO2ART 32.0 - 48.0 mmHg - 40.7 40.1 - -   HCO3 20.0 - 28.0 mmol/L - 19.6(L) 24.1 - -   TCO2 0 - 100 mmol/L '24 21 25 25 ' -   ACIDBASEDEF 0.0 - 2.0 mmol/L - 7.0(H) 1.0 - -   O2SAT % - 94.0 92.0 - -      Capillary Blood Glucose: Lab Results  Component Value Date   GLUCAP 134 (H) 07/20/2016   GLUCAP 136 (H) 07/20/2016   GLUCAP 96 07/20/2016    GLUCAP 118 (H) 07/20/2016   GLUCAP 122 (H) 07/19/2016     Exercise Target Goals:    Exercise Program Goal: Individual exercise prescription set with THRR, safety & activity barriers. Participant demonstrates ability to understand and report RPE using BORG scale, to self-measure pulse accurately, and to acknowledge the importance of the exercise prescription.  Exercise Prescription Goal: Starting with aerobic activity 30 plus minutes a day, 3 days per week for initial exercise prescription. Provide home exercise prescription and guidelines that participant acknowledges understanding prior to discharge.  Activity Barriers & Risk Stratification:     Activity Barriers & Cardiac Risk Stratification - 11/30/16 0906      Activity Barriers & Cardiac Risk Stratification   Activity Barriers Deconditioning;Muscular Weakness;Arthritis;Other (comment)   Comments poor circulation on L leg, L big toe removal    Cardiac Risk Stratification High      6 Minute Walk:     6 Minute Walk    Row Name 11/30/16 1347         6 Minute Walk   Phase Initial     Distance 1432 feet     Walk Time 6 minutes     # of Rest Breaks 0     MPH 2.7     METS 4.2     RPE 15     VO2 Peak 14.9     Symptoms Yes (comment)     Comments right calf pain 1/4     Resting HR 85 bpm     Resting BP 142/90     Max Ex. HR 101 bpm     Max Ex. BP 170/98     2 Minute Post BP 166/90        Oxygen Initial Assessment:   Oxygen Re-Evaluation:   Oxygen Discharge (Final Oxygen Re-Evaluation):   Initial Exercise Prescription:     Initial Exercise Prescription - 11/30/16 1300      Date of Initial Exercise RX and Referring Provider   Date 11/30/16   Referring Provider Kirk Ruths MD     Treadmill   MPH 2.8   Grade 0   Minutes 10   METs 2.53     Recumbant Bike   Level 2   Minutes 10   METs 2.9     NuStep   Level 2   Minutes 10   METs 2.4     Prescription Details   Frequency (times  per week) 5    Duration Progress to 45 minutes of aerobic exercise without signs/symptoms of physical distress     Intensity   THRR 40-80% of Max Heartrate 63-126   Ratings of Perceived Exertion 11-13   Perceived Dyspnea 0-4     Progression   Progression Continue progressive overload as per policy without signs/symptoms or physical distress.     Resistance Training   Training Prescription Yes   Weight 2   Reps 10-15      Perform Capillary Blood Glucose checks as needed.  Exercise Prescription Changes:      Exercise Prescription Changes    Row Name 12/15/16 1300             Response to Exercise   Blood Pressure (Admit) 118/70       Blood Pressure (Exercise) 142/70       Blood Pressure (Exit) 130/80       Heart Rate (Admit) 86 bpm       Heart Rate (Exercise) 101 bpm       Heart Rate (Exit) 85 bpm       Rating of Perceived Exertion (Exercise) 13       Duration Progress to 45 minutes of aerobic exercise without signs/symptoms of physical distress       Intensity THRR unchanged         Progression   Progression Continue to progress workloads to maintain intensity without signs/symptoms of physical distress.       Average METs 2.4         Resistance Training   Training Prescription Yes       Weight 2       Reps 10-15         Treadmill   MPH 2       Grade 0       Minutes 10       METs 2.53         Recumbant Bike   Level 2       Minutes 10       METs 1.9         NuStep   Level 2       Minutes 10       METs 2.4         Home Exercise Plan   Plans to continue exercise at Home (comment)       Frequency Add 3 additional days to program exercise sessions.       Initial Home Exercises Provided 12/15/16          Exercise Comments:      Exercise Comments    Row Name 12/15/16 1402           Exercise Comments Reviewed METs and goals with pt.  Pt is doing well with exercise.          Exercise Goals and Review:      Exercise Goals    Row Name 11/30/16 0909              Exercise Goals   Increase Physical Activity Yes       Intervention Provide advice, education, support and counseling about physical activity/exercise needs.;Develop an individualized exercise prescription for aerobic and resistive training based on initial evaluation findings, risk stratification, comorbidities and participant's personal goals.       Expected Outcomes Achievement of increased cardiorespiratory fitness and enhanced flexibility, muscular endurance and strength shown through measurements of functional capacity and personal statement of participant.  Increase Strength and Stamina Yes  increase breathing capacity and increase exercise capacity.        Intervention Provide advice, education, support and counseling about physical activity/exercise needs.;Develop an individualized exercise prescription for aerobic and resistive training based on initial evaluation findings, risk stratification, comorbidities and participant's personal goals.       Expected Outcomes Achievement of increased cardiorespiratory fitness and enhanced flexibility, muscular endurance and strength shown through measurements of functional capacity and personal statement of participant.          Exercise Goals Re-Evaluation :     Exercise Goals Re-Evaluation    [place] Name 12/15/16 1359             Exercise Goal Re-Evaluation   Exercise Goals Review Increase Physical Activity;Increase Strenth and Stamina       Comments Pt states she feels great and she is proud of what she's accomplished thus far, because she's never exercised regurlarly before the program.       Expected Outcomes Continue with exericse prescription and begin walking at home in order to increase MET level and cardiorespiratory fitness.           Discharge Exercise Prescription (Final Exercise Prescription Changes):     Exercise Prescription Changes - 12/15/16 1300      Response to Exercise   Blood Pressure (Admit)  118/70   Blood Pressure (Exercise) 142/70   Blood Pressure (Exit) 130/80   Heart Rate (Admit) 86 bpm   Heart Rate (Exercise) 101 bpm   Heart Rate (Exit) 85 bpm   Rating of Perceived Exertion (Exercise) 13   Duration Progress to 45 minutes of aerobic exercise without signs/symptoms of physical distress   Intensity THRR unchanged     Progression   Progression Continue to progress workloads to maintain intensity without signs/symptoms of physical distress.   Average METs 2.4     Resistance Training   Training Prescription Yes   Weight 2   Reps 10-15     Treadmill   MPH 2   Grade 0   Minutes 10   METs 2.53     Recumbant Bike   Level 2   Minutes 10   METs 1.9     NuStep   Level 2   Minutes 10   METs 2.4     Home Exercise Plan   Plans to continue exercise at Home (comment)   Frequency Add 3 additional days to program exercise sessions.   Initial Home Exercises Provided 12/15/16      Nutrition:  Target Goals: Understanding of nutrition guidelines, daily intake of sodium <1521m, cholesterol <2073m calories 30% from fat and 7% or less from saturated fats, daily to have 5 or more servings of fruits and vegetables.  Biometrics:     Pre Biometrics - 11/30/16 1349      Pre Biometrics   Height 5' 5.5" (1.664 m)   Weight 123 lb 7.3 oz (56 kg)   Waist Circumference 28 inches   Hip Circumference 35.5 inches   Waist to Hip Ratio 0.79 %   BMI (Calculated) 20.3   Triceps Skinfold 20 mm   % Body Fat 30.3 %   Grip Strength 36 kg   Flexibility 11 in   Single Leg Stand 8.09 seconds       Nutrition Therapy Plan and Nutrition Goals:   Nutrition Discharge: Nutrition Scores:   Nutrition Goals Re-Evaluation:   Nutrition Goals Re-Evaluation:   Nutrition Goals Discharge (Final Nutrition Goals Re-Evaluation):  Psychosocial: Target Goals: Acknowledge presence or absence of significant depression and/or stress, maximize coping skills, provide positive support  system. Participant is able to verbalize types and ability to use techniques and skills needed for reducing stress and depression.  Initial Review & Psychosocial Screening:     Initial Psych Review & Screening - 11/30/16 1823      Initial Review   Current issues with Current Stress Concerns   Source of Stress Concerns Chronic Illness   Comments rates low level of stress regarding health     Family Dynamics   Good Support System? Yes      Quality of Life Scores:     Quality of Life - 11/30/16 1350      Quality of Life Scores   Health/Function Pre 19.93 %   Socioeconomic Pre 22.9 %   Psych/Spiritual Pre 24.43 %   Family Pre 25.5 %   GLOBAL Pre 22.15 %      PHQ-9: Recent Review Flowsheet Data    Depression screen [facility] 2/9 12/08/2016   Decreased Interest 0   Down, Depressed, Hopeless 0   PHQ - 2 Score 0     Interpretation of Total Score  Total Score Depression Severity:  1-4 = Minimal depression, 5-9 = Mild depression, 10-14 = Moderate depression, 15-19 = Moderately severe depression, 20-27 = Severe depression   Psychosocial Evaluation and Intervention:   Psychosocial Re-Evaluation:     Psychosocial Re-Evaluation    [place] Name 12/19/16 1656             Psychosocial Re-Evaluation   Current issues with None Identified       Interventions Encouraged to attend Cardiac Rehabilitation for the exercise       Continue Psychosocial Services  No Follow up required          Psychosocial Discharge (Final Psychosocial Re-Evaluation):     Psychosocial Re-Evaluation - 12/19/16 1656      Psychosocial Re-Evaluation   Current issues with None Identified   Interventions Encouraged to attend Cardiac Rehabilitation for the exercise   Continue Psychosocial Services  No Follow up required      Vocational Rehabilitation: Provide vocational rehab assistance to qualifying candidates.   Vocational Rehab Evaluation & Intervention:   Education: Education Goals: Education  classes will be provided on a weekly basis, covering required topics. Participant will state understanding/return demonstration of topics presented.  Learning Barriers/Preferences:     Learning Barriers/Preferences - 11/30/16 1224      Learning Barriers/Preferences   Learning Barriers Sight   Learning Preferences Written Material      Education Topics: Count Your Pulse:  -Group instruction provided by verbal instruction, demonstration, patient participation and written materials to support subject.  Instructors address importance of being able to find your pulse and how to count your pulse when at home without a heart monitor.  Patients get hands on experience counting their pulse with staff help and individually.   Heart Attack, Angina, and Risk Factor Modification:  -Group instruction provided by verbal instruction, video, and written materials to support subject.  Instructors address signs and symptoms of angina and heart attacks.    Also discuss risk factors for heart disease and how to make changes to improve heart health risk factors.   Functional Fitness:  -Group instruction provided by verbal instruction, demonstration, patient participation, and written materials to support subject.  Instructors address safety measures for doing things around the house.  Discuss how to get up and down off the floor,  how to pick things up properly, how to safely get out of a chair without assistance, and balance training.   Meditation and Mindfulness:  -Group instruction provided by verbal instruction, patient participation, and written materials to support subject.  Instructor addresses importance of mindfulness and meditation practice to help reduce stress and improve awareness.  Instructor also leads participants through a meditation exercise.    CARDIAC REHAB PHASE II EXERCISE from 12/13/2016 in [place]  Date  12/13/16  Instruction Review Code  2- meets  goals/outcomes      Stretching for Flexibility and Mobility:  -Group instruction provided by verbal instruction, patient participation, and written materials to support subject.  Instructors lead participants through series of stretches that are designed to increase flexibility thus improving mobility.  These stretches are additional exercise for major muscle groups that are typically performed during regular warm up and cool down.   Hands Only CPR Anytime:  -Group instruction provided by verbal instruction, video, patient participation and written materials to support subject.  Instructors co-teach with AHA video for hands only CPR.  Participants get hands on experience with mannequins.   Nutrition I class: Heart Healthy Eating:  -Group instruction provided by PowerPoint slides, verbal discussion, and written materials to support subject matter. The instructor gives an explanation and review of the Therapeutic Lifestyle Changes diet recommendations, which includes a discussion on lipid goals, dietary fat, sodium, fiber, plant stanol/sterol esters, sugar, and the components of a well-balanced, healthy diet.   Nutrition II class: Lifestyle Skills:  -Group instruction provided by PowerPoint slides, verbal discussion, and written materials to support subject matter. The instructor gives an explanation and review of label reading, grocery shopping for heart health, heart healthy recipe modifications, and ways to make healthier choices when eating out.   Diabetes Question & Answer:  -Group instruction provided by PowerPoint slides, verbal discussion, and written materials to support subject matter. The instructor gives an explanation and review of diabetes co-morbidities, pre- and post-prandial blood glucose goals, pre-exercise blood glucose goals, signs, symptoms, and treatment of hypoglycemia and hyperglycemia, and foot care basics.   Diabetes Blitz:  -Group instruction provided by PowerPoint  slides, verbal discussion, and written materials to support subject matter. The instructor gives an explanation and review of the physiology behind type 1 and type 2 diabetes, diabetes medications and rational behind using different medications, pre- and post-prandial blood glucose recommendations and Hemoglobin A1c goals, diabetes diet, and exercise including blood glucose guidelines for exercising safely.    Portion Distortion:  -Group instruction provided by PowerPoint slides, verbal discussion, written materials, and food models to support subject matter. The instructor gives an explanation of serving size versus portion size, changes in portions sizes over the last 20 years, and what consists of a serving from each food group.   Stress Management:  -Group instruction provided by verbal instruction, video, and written materials to support subject matter.  Instructors review role of stress in heart disease and how to cope with stress positively.     Exercising on Your Own:  -Group instruction provided by verbal instruction, power point, and written materials to support subject.  Instructors discuss benefits of exercise, components of exercise, frequency and intensity of exercise, and end points for exercise.  Also discuss use of nitroglycerin and activating EMS.  Review options of places to exercise outside of rehab.  Review guidelines for sex with heart disease.   Cardiac Drugs I:  -Group instruction provided by verbal instruction and  written materials to support subject.  Instructor reviews cardiac drug classes: antiplatelets, anticoagulants, beta blockers, and statins.  Instructor discusses reasons, side effects, and lifestyle considerations for each drug class.   Cardiac Drugs II:  -Group instruction provided by verbal instruction and written materials to support subject.  Instructor reviews cardiac drug classes: angiotensin converting enzyme inhibitors (ACE-I), angiotensin II receptor  blockers (ARBs), nitrates, and calcium channel blockers.  Instructor discusses reasons, side effects, and lifestyle considerations for each drug class.   Anatomy and Physiology of the Circulatory System:  -Group instruction provided by verbal instruction, video, and written materials to support subject.  Reviews functional anatomy of heart, how it relates to various diagnoses, and what role the heart plays in the overall system.   Knowledge Questionnaire Score:     Knowledge Questionnaire Score - 11/30/16 1345      Knowledge Questionnaire Score   Pre Score 18/24      Core Components/Risk Factors/Patient Goals at Admission:     Personal Goals and Risk Factors at Admission - 11/30/16 1124      Core Components/Risk Factors/Patient Goals on Admission   Lipids Yes   Intervention Provide education and support for participant on nutrition & aerobic/resistive exercise along with prescribed medications to achieve LDL <36m, HDL >454m   Expected Outcomes Long Term: Cholesterol controlled with medications as prescribed, with individualized exercise RX and with personalized nutrition plan. Value goals: LDL < 7057mHDL > 40 mg.;Short Term: Participant states understanding of desired cholesterol values and is compliant with medications prescribed. Participant is following exercise prescription and nutrition guidelines.      Core Components/Risk Factors/Patient Goals Review:    Core Components/Risk Factors/Patient Goals at Discharge (Final Review):    ITP Comments:     ITP Comments    Row Name 11/30/16 0857           ITP Comments Medical Director, Dr. TraFransico Him       Comments: RhoAngelicia making expected progress toward personal goals after completing 4 sessions. Recommend continued exercise and life style modification education including  stress management and relaxation techniques to decrease cardiac risk profile. RhoJaidin enjoying participating in phase 2 cardiac rehab.MarBarnet PallN,BSN 12/19/2016 4:57 PM

## 2016-12-20 ENCOUNTER — Encounter (HOSPITAL_COMMUNITY): Payer: Medicare Other

## 2016-12-22 ENCOUNTER — Encounter (HOSPITAL_COMMUNITY): Payer: Medicare Other

## 2016-12-25 ENCOUNTER — Encounter (HOSPITAL_COMMUNITY): Payer: Medicare Other

## 2016-12-27 ENCOUNTER — Encounter (HOSPITAL_COMMUNITY): Payer: Medicare Other

## 2016-12-27 ENCOUNTER — Encounter (HOSPITAL_COMMUNITY)
Admission: RE | Admit: 2016-12-27 | Discharge: 2016-12-27 | Disposition: A | Discharge status: Home / Self Care | Payer: Medicare Other | Source: Ambulatory Visit | Attending: Cardiology | Admitting: Cardiology

## 2016-12-27 DIAGNOSIS — I214 Non-ST elevation (NSTEMI) myocardial infarction: Secondary | ICD-10-CM | POA: Insufficient documentation

## 2016-12-27 DIAGNOSIS — Z951 Presence of aortocoronary bypass graft: Secondary | ICD-10-CM | POA: Diagnosis present

## 2016-12-27 DIAGNOSIS — Z48812 Encounter for surgical aftercare following surgery on the circulatory system: Secondary | ICD-10-CM | POA: Diagnosis not present

## 2016-12-29 ENCOUNTER — Encounter (HOSPITAL_COMMUNITY)
Admission: RE | Admit: 2016-12-29 | Discharge: 2016-12-29 | Disposition: A | Discharge status: Home / Self Care | Payer: Medicare Other | Source: Ambulatory Visit | Attending: Cardiology | Admitting: Cardiology

## 2016-12-29 ENCOUNTER — Encounter (HOSPITAL_COMMUNITY): Payer: Medicare Other

## 2016-12-29 DIAGNOSIS — Z951 Presence of aortocoronary bypass graft: Secondary | ICD-10-CM

## 2016-12-29 DIAGNOSIS — I214 Non-ST elevation (NSTEMI) myocardial infarction: Secondary | ICD-10-CM

## 2016-12-29 DIAGNOSIS — Z48812 Encounter for surgical aftercare following surgery on the circulatory system: Secondary | ICD-10-CM | POA: Diagnosis not present

## 2017-01-01 ENCOUNTER — Encounter (HOSPITAL_COMMUNITY): Payer: Medicare Other

## 2017-01-03 ENCOUNTER — Encounter (HOSPITAL_COMMUNITY): Payer: Medicare Other

## 2017-01-03 ENCOUNTER — Encounter (HOSPITAL_COMMUNITY)
Admission: RE | Admit: 2017-01-03 | Payer: Medicare Other | Source: Ambulatory Visit | Attending: Cardiology | Admitting: Cardiology

## 2017-01-05 ENCOUNTER — Encounter (HOSPITAL_COMMUNITY): Payer: Medicare Other

## 2017-01-05 ENCOUNTER — Encounter (HOSPITAL_COMMUNITY)
Admission: RE | Admit: 2017-01-05 | Discharge: 2017-01-05 | Disposition: A | Discharge status: Home / Self Care | Payer: Medicare Other | Source: Ambulatory Visit | Attending: Cardiology | Admitting: Cardiology

## 2017-01-08 ENCOUNTER — Encounter (HOSPITAL_COMMUNITY): Payer: Medicare Other

## 2017-01-08 ENCOUNTER — Encounter (HOSPITAL_COMMUNITY)
Admission: RE | Admit: 2017-01-08 | Discharge: 2017-01-08 | Disposition: A | Discharge status: Home / Self Care | Payer: Medicare Other | Source: Ambulatory Visit | Attending: Cardiology | Admitting: Cardiology

## 2017-01-08 DIAGNOSIS — Z48812 Encounter for surgical aftercare following surgery on the circulatory system: Secondary | ICD-10-CM | POA: Diagnosis not present

## 2017-01-08 DIAGNOSIS — Z951 Presence of aortocoronary bypass graft: Secondary | ICD-10-CM

## 2017-01-08 DIAGNOSIS — I214 Non-ST elevation (NSTEMI) myocardial infarction: Secondary | ICD-10-CM

## 2017-01-08 NOTE — Progress Notes (Signed)
Mercedes [name] 61 y.o. female Nutrition Note Spoke with pt. Nutrition Plan and Nutrition Survey goals reviewed with pt. Pt working toward following the Therapeutic Lifestyle Changes diet. Pt is aware of pre-diabetic diagnosis. Pt states she has an appointment with her PCP to recheck her A1c "soon." Pt expressed understanding of the information reviewed. Pt aware of nutrition education classes offered and is unable to attend nutrition classes.  Lab Results  Component Value Date   HGBA1C 6.2 09/29/2016   Wt Readings from Last 3 Encounters:  11/30/16 123 lb 7.3 oz (56 kg)  11/22/16 123 lb (55.8 kg)  11/02/16 121 lb 12.8 oz (55.2 kg)    Nutrition Diagnosis ? Food-and nutrition-related knowledge deficit related to lack of exposure to information as related to diagnosis of: ? CVD ? Pre-DM  Nutrition Intervention ? Pt's individual nutrition plan reviewed with pt. ? Benefits of adopting Therapeutic Lifestyle Changes discussed when Medficts reviewed. ? Pt to attend the Portion Distortion class  ? Pt given handouts for: ? Nutrition I class ? Nutrition II class  ? Continue client-centered nutrition education by RD, as part of interdisciplinary care. Goal(s) ? Pt to identify and limit food sources of saturated fat, trans fat, and sodium ? Pt to describe the benefit of including fruits, vegetables, whole grains, and low-fat dairy products in a heart healthy meal plan. Monitor and Evaluate progress toward nutrition goal with team. Mickle Plumb, M.Ed, RD, LDN, CDE 01/08/2017 3:58 PM

## 2017-01-10 ENCOUNTER — Encounter (HOSPITAL_COMMUNITY): Payer: Medicare Other

## 2017-01-10 ENCOUNTER — Encounter (HOSPITAL_COMMUNITY)
Admission: RE | Admit: 2017-01-10 | Discharge: 2017-01-10 | Disposition: A | Discharge status: Home / Self Care | Payer: Medicare Other | Source: Ambulatory Visit | Attending: Cardiology | Admitting: Cardiology

## 2017-01-10 VITALS — Wt 125.9 lb

## 2017-01-10 DIAGNOSIS — Z48812 Encounter for surgical aftercare following surgery on the circulatory system: Secondary | ICD-10-CM | POA: Diagnosis not present

## 2017-01-10 DIAGNOSIS — I214 Non-ST elevation (NSTEMI) myocardial infarction: Secondary | ICD-10-CM

## 2017-01-10 DIAGNOSIS — Z951 Presence of aortocoronary bypass graft: Secondary | ICD-10-CM

## 2017-01-11 IMAGING — CR DG CHEST 2V
2 series · 2 of 2 positions shown · non-contrast
Comparison: 08/01/2016

CLINICAL DATA: CABG

EXAM:
CHEST  2 VIEW

[w chest pa]
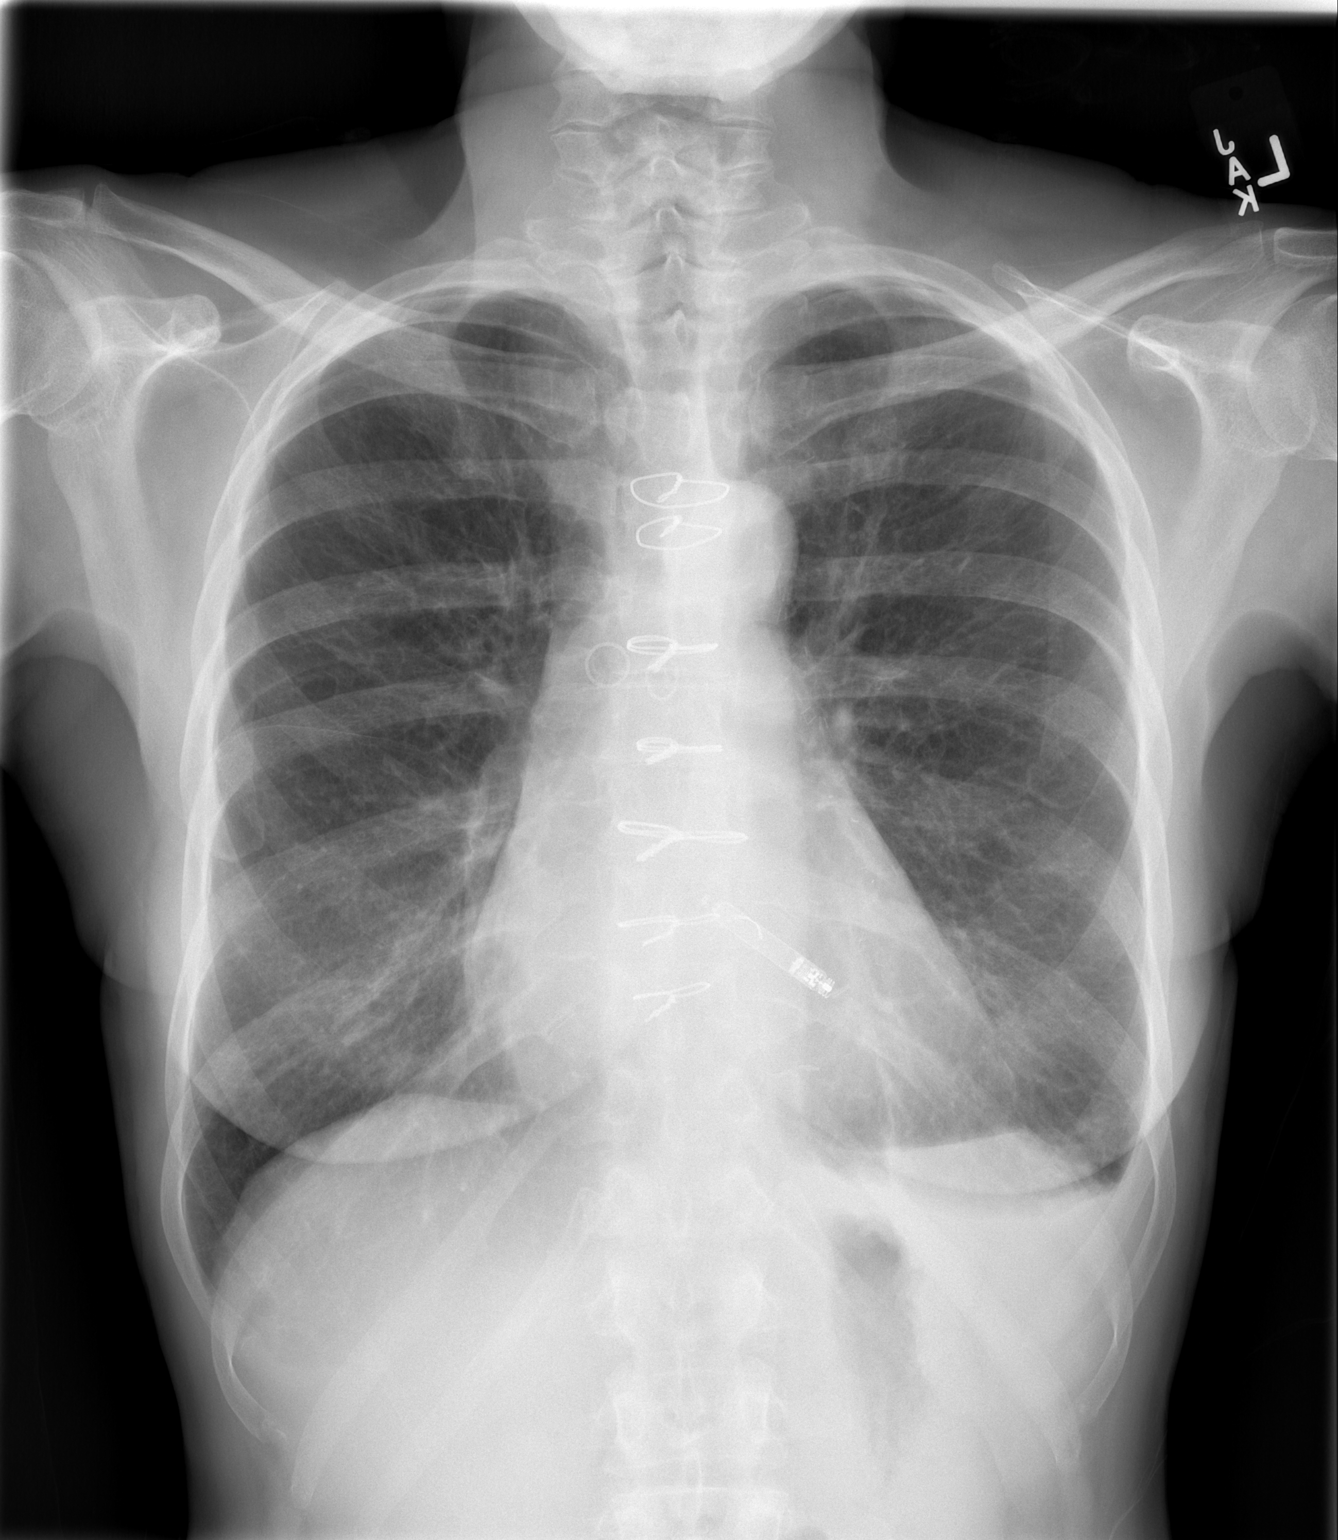

[w chest lat]
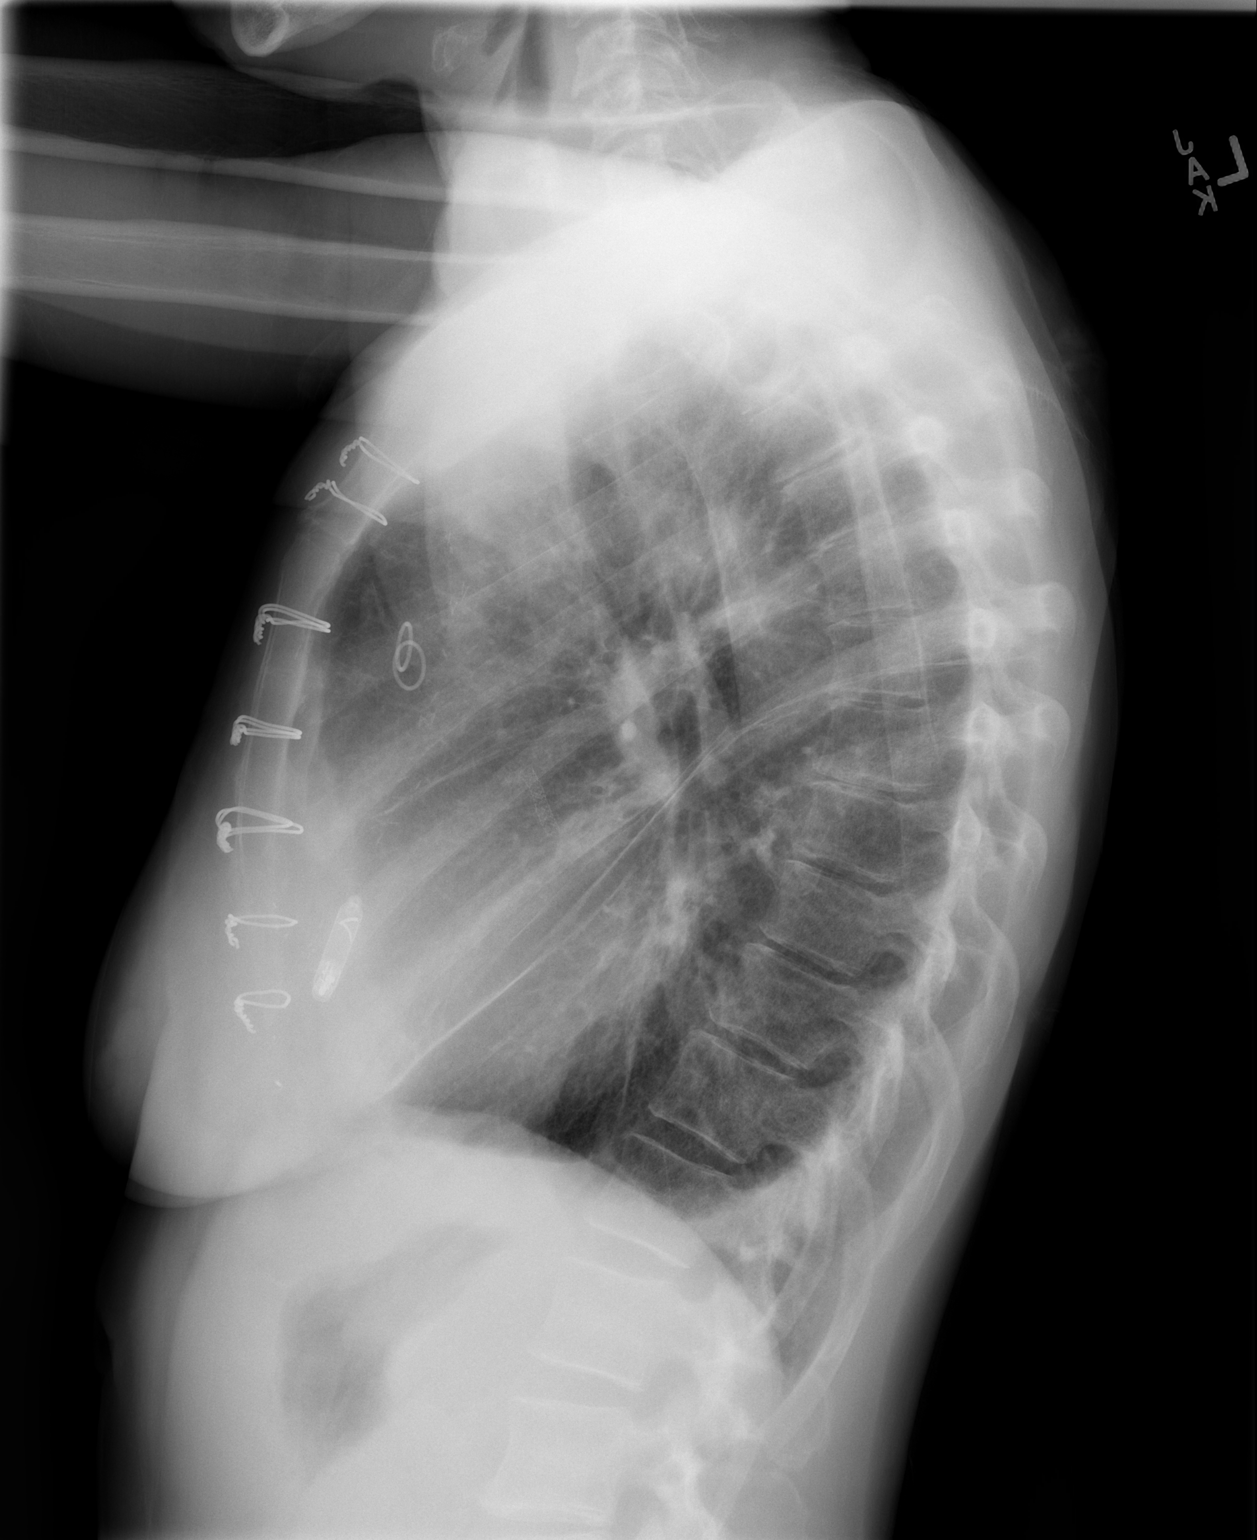

[2 of 2 positions shown; findings below may reference images not displayed]

FINDINGS: Prior CABG. Loop recorder device projects over the left lower chest.
Heart is normal size. Small left pleural effusion. No confluent
opacities or effusions.
IMPRESSION: Small left pleural effusion. This is decreased in size since prior
study.

## 2017-01-12 ENCOUNTER — Encounter (HOSPITAL_COMMUNITY): Payer: Medicare Other

## 2017-01-15 ENCOUNTER — Encounter (HOSPITAL_COMMUNITY): Payer: Medicare Other

## 2017-01-17 ENCOUNTER — Encounter (HOSPITAL_COMMUNITY): Payer: Self-pay | Admitting: *Deleted

## 2017-01-17 ENCOUNTER — Encounter (HOSPITAL_COMMUNITY): Payer: Medicare Other

## 2017-01-17 DIAGNOSIS — Z951 Presence of aortocoronary bypass graft: Secondary | ICD-10-CM

## 2017-01-17 DIAGNOSIS — I214 Non-ST elevation (NSTEMI) myocardial infarction: Secondary | ICD-10-CM

## 2017-01-17 NOTE — Progress Notes (Signed)
Cardiac Individual Treatment Plan  Patient Details  Name: Mercedes [name] MRN: [id number] Date of Birth: 23-Mar-1956 Referring Provider:     CARDIAC REHAB PHASE II ORIENTATION from 11/30/2016 in [facility]  Referring Provider  Kirk Ruths MD      Initial Encounter Date:    CARDIAC REHAB PHASE II ORIENTATION from 11/30/2016 in [place]  Date  11/30/16  Referring Provider  Kirk Ruths MD      Visit Diagnosis: 06/2016 NSTEMI (non-ST elevated myocardial infarction) ([place])  07/17/16 S/P CABG x 3  Patient's Home Medications on Admission:  Current Outpatient Prescriptions:  .  acetaminophen (TYLENOL) 325 MG tablet, Take 2 tablets (650 mg total) by mouth every 6 (six) hours as needed for mild pain., Disp: 30 tablet, Rfl: 0 .  apixaban (ELIQUIS) 5 MG TABS tablet, Take 1 tablet (5 mg total) by mouth 2 (two) times daily., Disp: 180 tablet, Rfl: 2 .  aspirin EC 81 MG tablet, Take 1 tablet (81 mg total) by mouth daily., Disp: , Rfl:  .  ENSURE (ENSURE), Take 237 mLs by mouth daily., Disp: , Rfl:  .  lisinopril (PRINIVIL,ZESTRIL) 5 MG tablet, Take 1 tablet (5 mg total) by mouth daily., Disp: 90 tablet, Rfl: 2 .  metoprolol tartrate (LOPRESSOR) 25 MG tablet, Take 0.5 tablets (12.5 mg total) by mouth 2 (two) times daily., Disp: 90 tablet, Rfl: 2 .  nitroGLYCERIN (NITROSTAT) 0.4 MG SL tablet, Place 1 tablet (0.4 mg total) under the tongue every 5 (five) minutes as needed for chest pain., Disp: 90 tablet, Rfl: 3 .  rosuvastatin (CRESTOR) 40 MG tablet, Take 0.5 tablets (20 mg total) by mouth daily., Disp: 90 tablet, Rfl: 3  Past Medical History: Past Medical History:  Diagnosis Date  . Abnormal LFTs    a. in the past when on Lipitor  . Anemia    as a young woman  . Anxiety   . Arnold-Chiari malformation, type I ([place])    MRI brain 02/2010  . CAD (coronary artery disease)    a. NSTEMI 07/2009: LHC - D2 40%, LAD irreg., EF 50%  with apical AK (tako-tsubo CM);  b. Inf STEMI (04/2013): Tx Promus DES to CFX;  c. 08/2013 Lexi CL: No ischemia, dist ant/ap/inf-ap infarct, EF  d. 06/2016 NSTEMI with LM disease: s/p CABG on 07/17/2016 w/ LIMA-LAD, SVG-OM, and SVG-dRCA.  [name] DEPRESSION   . Dizziness    ? CVA 01/2010 - carotid dopplers with no ICA stenosis  . GERD (gastroesophageal reflux disease)   . HYPERLIPIDEMIA   . HYPERTENSION   . MI (myocardial infarction) ([place])   . PAD (peripheral artery disease) ([place])    s/p L fem pop 11/2013 in setting of 1st toe osteo/gangrene  . Paroxysmal atrial fibrillation (H[facility])    a. anticoagulated with Eliquis 10/2014  . RA (rheumatoid arthritis) ([place])    prior tx by Dr. Ouida Sills  . Sjogren's syndrome ([place]) 06/02/13   pt denies this (12/01/13)  . SLE (systemic lupus erythematosus) ([place]) dx 05/2013   follows with rheum Ouida Sills)  . Tachy-brady syndrome (H[facility])    a. s/p STJ Leadless pacemaker 11-04-2014 Dr Rayann Heman  . 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 Former Smoker  . Types: Cigarettes  . Quit date: 05/19/2016  Smokeless Tobacco  . Never Used    Comment:  Labs: Recent Review Flowsheet Data    Labs for ITP Cardiac and Pulmonary Rehab Latest Ref Rng & Units 07/17/2016 07/17/2016 07/17/2016 07/18/2016 09/29/2016   Cholestrol 0 - 200 mg/dL - - - - -   LDLCALC 0 - 99 mg/dL - - - - -   HDL >40 mg/dL - - - - -   Trlycerides <150 mg/dL - - - - -   Hemoglobin A1c 4.6 - 6.5 % - - - - 6.2   PHART 7.350 - 7.450 - 7.288(L) 7.384 - -   PCO2ART 32.0 - 48.0 mmHg - 40.7 40.1 - -   HCO3 20.0 - 28.0 mmol/L - 19.6(L) 24.1 - -   TCO2 0 - 100 mmol/L '24 21 25 25 ' -   ACIDBASEDEF 0.0 - 2.0 mmol/L - 7.0(H) 1.0 - -   O2SAT % - 94.0 92.0 - -      Capillary Blood Glucose: Lab Results  Component Value Date   GLUCAP 134 (H) 07/20/2016   GLUCAP 136 (H) 07/20/2016   GLUCAP 96 07/20/2016    GLUCAP 118 (H) 07/20/2016   GLUCAP 122 (H) 07/19/2016     Exercise Target Goals:    Exercise Program Goal: Individual exercise prescription set with THRR, safety & activity barriers. Participant demonstrates ability to understand and report RPE using BORG scale, to self-measure pulse accurately, and to acknowledge the importance of the exercise prescription.  Exercise Prescription Goal: Starting with aerobic activity 30 plus minutes a day, 3 days per week for initial exercise prescription. Provide home exercise prescription and guidelines that participant acknowledges understanding prior to discharge.  Activity Barriers & Risk Stratification:     Activity Barriers & Cardiac Risk Stratification - 11/30/16 0906      Activity Barriers & Cardiac Risk Stratification   Activity Barriers Deconditioning;Muscular Weakness;Arthritis;Other (comment)   Comments poor circulation on L leg, L big toe removal    Cardiac Risk Stratification High      6 Minute Walk:     6 Minute Walk    Row Name 11/30/16 1347         6 Minute Walk   Phase Initial     Distance 1432 feet     Walk Time 6 minutes     # of Rest Breaks 0     MPH 2.7     METS 4.2     RPE 15     VO2 Peak 14.9     Symptoms Yes (comment)     Comments right calf pain 1/4     Resting HR 85 bpm     Resting BP 142/90     Max Ex. HR 101 bpm     Max Ex. BP 170/98     2 Minute Post BP 166/90        Oxygen Initial Assessment:   Oxygen Re-Evaluation:   Oxygen Discharge (Final Oxygen Re-Evaluation):   Initial Exercise Prescription:     Initial Exercise Prescription - 11/30/16 1300      Date of Initial Exercise RX and Referring Provider   Date 11/30/16   Referring Provider Kirk Ruths MD     Treadmill   MPH 2.8   Grade 0   Minutes 10   METs 2.53     Recumbant Bike   Level 2   Minutes 10   METs 2.9     NuStep   Level 2   Minutes 10   METs 2.4     Prescription Details   Frequency (times  per week) 5    Duration Progress to 45 minutes of aerobic exercise without signs/symptoms of physical distress     Intensity   THRR 40-80% of Max Heartrate 63-126   Ratings of Perceived Exertion 11-13   Perceived Dyspnea 0-4     Progression   Progression Continue progressive overload as per policy without signs/symptoms or physical distress.     Resistance Training   Training Prescription Yes   Weight 2   Reps 10-15      Perform Capillary Blood Glucose checks as needed.  Exercise Prescription Changes:     Exercise Prescription Changes    Row Name 12/15/16 1300 01/04/17 1500           Response to Exercise   Blood Pressure (Admit) 118/70 138/86      Blood Pressure (Exercise) 142/70 142/84      Blood Pressure (Exit) 130/80 128/82      Heart Rate (Admit) 86 bpm 81 bpm      Heart Rate (Exercise) 101 bpm 115 bpm      Heart Rate (Exit) 85 bpm 77 bpm      Rating of Perceived Exertion (Exercise) 13 12      Duration Progress to 45 minutes of aerobic exercise without signs/symptoms of physical distress Progress to 45 minutes of aerobic exercise without signs/symptoms of physical distress      Intensity THRR unchanged THRR unchanged        Progression   Progression Continue to progress workloads to maintain intensity without signs/symptoms of physical distress. Continue to progress workloads to maintain intensity without signs/symptoms of physical distress.      Average METs 2.4 2.5        Resistance Training   Training Prescription Yes Yes      Weight 2 2      Reps 10-15 10-15        Treadmill   MPH 2 2      Grade 0 4      Minutes 10 10      METs 2.53 2.84        Recumbant Bike   Level 2 2      Minutes 10 10      METs 1.9 2.3        NuStep   Level 2 2      Minutes 10 10      METs 2.4 2.5        Home Exercise Plan   Plans to continue exercise at Home (comment) Home (comment)      Frequency Add 3 additional days to program exercise sessions. Add 3 additional days to program  exercise sessions.      Initial Home Exercises Provided 12/15/16 12/15/16         Exercise Comments:     Exercise Comments    Row Name 12/15/16 1402 01/10/17 1703         Exercise Comments Reviewed METs and goals with pt.  Pt is doing well with exercise. Reviewed METs and goals with pt.  Pt is doing well with exercise.         Exercise Goals and Review:     Exercise Goals    Row Name 11/30/16 0909             Exercise Goals   Increase Physical Activity Yes       Intervention Provide advice, education, support and counseling about physical activity/exercise needs.;Develop an individualized exercise prescription for aerobic and resistive training  based on initial evaluation findings, risk stratification, comorbidities and participant's personal goals.       Expected Outcomes Achievement of increased cardiorespiratory fitness and enhanced flexibility, muscular endurance and strength shown through measurements of functional capacity and personal statement of participant.       Increase Strength and Stamina Yes  increase breathing capacity and increase exercise capacity.        Intervention Provide advice, education, support and counseling about physical activity/exercise needs.;Develop an individualized exercise prescription for aerobic and resistive training based on initial evaluation findings, risk stratification, comorbidities and participant's personal goals.       Expected Outcomes Achievement of increased cardiorespiratory fitness and enhanced flexibility, muscular endurance and strength shown through measurements of functional capacity and personal statement of participant.          Exercise Goals Re-Evaluation :     Exercise Goals Re-Evaluation    Row Name 12/15/16 1359 01/10/17 1703           Exercise Goal Re-Evaluation   Exercise Goals Review Increase Physical Activity;Increase Strenth and Stamina Increase Physical Activity;Increase Strenth and Stamina       Comments Pt states she feels great and she is proud of what she's accomplished thus far, because she's never exercised regurlarly before the program. Pt states that she feels like her legs are getting stronger and she is doing upper body exercises at home      Expected Outcomes Continue with exericse prescription and begin walking at home in order to increase MET level and cardiorespiratory fitness. Continue with exericse prescription and begin walking at home in order to increase MET level and cardiorespiratory fitness.          Discharge Exercise Prescription (Final Exercise Prescription Changes):     Exercise Prescription Changes - 01/04/17 1500      Response to Exercise   Blood Pressure (Admit) 138/86   Blood Pressure (Exercise) 142/84   Blood Pressure (Exit) 128/82   Heart Rate (Admit) 81 bpm   Heart Rate (Exercise) 115 bpm   Heart Rate (Exit) 77 bpm   Rating of Perceived Exertion (Exercise) 12   Duration Progress to 45 minutes of aerobic exercise without signs/symptoms of physical distress   Intensity THRR unchanged     Progression   Progression Continue to progress workloads to maintain intensity without signs/symptoms of physical distress.   Average METs 2.5     Resistance Training   Training Prescription Yes   Weight 2   Reps 10-15     Treadmill   MPH 2   Grade 4   Minutes 10   METs 2.84     Recumbant Bike   Level 2   Minutes 10   METs 2.3     NuStep   Level 2   Minutes 10   METs 2.5     Home Exercise Plan   Plans to continue exercise at Home (comment)   Frequency Add 3 additional days to program exercise sessions.   Initial Home Exercises Provided 12/15/16      Nutrition:  Target Goals: Understanding of nutrition guidelines, daily intake of sodium <1547m, cholesterol <2046m calories 30% from fat and 7% or less from saturated fats, daily to have 5 or more servings of fruits and vegetables.  Biometrics:     Pre Biometrics - 11/30/16 1349       Pre Biometrics   Height 5' 5.5" (1.664 m)   Weight 123 lb 7.3 oz (56 kg)   Waist Circumference  28 inches   Hip Circumference 35.5 inches   Waist to Hip Ratio 0.79 %   BMI (Calculated) 20.3   Triceps Skinfold 20 mm   % Body Fat 30.3 %   Grip Strength 36 kg   Flexibility 11 in   Single Leg Stand 8.09 seconds       Nutrition Therapy Plan and Nutrition Goals:     Nutrition Therapy & Goals - 01/09/17 1229      Nutrition Therapy   Diet Therapeutic Lifestyle Changes     Personal Nutrition Goals   Nutrition Goal Pt to maintain her UBW of 115-123 lb   Personal Goal #2 Pt to identify and limit food sources of saturated fat, trans fat, and sodium.     Intervention Plan   Intervention Prescribe, educate and counsel regarding individualized specific dietary modifications aiming towards targeted core components such as weight, hypertension, lipid management, diabetes, heart failure and other comorbidities.   Expected Outcomes Short Term Goal: Understand basic principles of dietary content, such as calories, fat, sodium, cholesterol and nutrients.;Long Term Goal: Adherence to prescribed nutrition plan.      Nutrition Discharge: Nutrition Scores:     Nutrition Assessments - 01/09/17 1229      MEDFICTS Scores   Pre Score 80      Nutrition Goals Re-Evaluation:   Nutrition Goals Re-Evaluation:   Nutrition Goals Discharge (Final Nutrition Goals Re-Evaluation):   Psychosocial: Target Goals: Acknowledge presence or absence of significant depression and/or stress, maximize coping skills, provide positive support system. Participant is able to verbalize types and ability to use techniques and skills needed for reducing stress and depression.  Initial Review & Psychosocial Screening:     Initial Psych Review & Screening - 11/30/16 1823      Initial Review   Current issues with Current Stress Concerns   Source of Stress Concerns Chronic Illness   Comments rates low level of  stress regarding health     Family Dynamics   Good Support System? Yes      Quality of Life Scores:     Quality of Life - 11/30/16 1350      Quality of Life Scores   Health/Function Pre 19.93 %   Socioeconomic Pre 22.9 %   Psych/Spiritual Pre 24.43 %   Family Pre 25.5 %   GLOBAL Pre 22.15 %      PHQ-9: Recent Review Flowsheet Data    Depression screen [facility] 2/9 12/08/2016   Decreased Interest 0   Down, Depressed, Hopeless 0   PHQ - 2 Score 0     Interpretation of Total Score  Total Score Depression Severity:  1-4 = Minimal depression, 5-9 = Mild depression, 10-14 = Moderate depression, 15-19 = Moderately severe depression, 20-27 = Severe depression   Psychosocial Evaluation and Intervention:   Psychosocial Re-Evaluation:     Psychosocial Re-Evaluation    [place] Name 12/19/16 1656 01/17/17 1736           Psychosocial Re-Evaluation   Current issues with None Identified None Identified      Interventions Encouraged to attend Cardiac Rehabilitation for the exercise Encouraged to attend Cardiac Rehabilitation for the exercise      Continue Psychosocial Services  No Follow up required No Follow up required         Psychosocial Discharge (Final Psychosocial Re-Evaluation):     Psychosocial Re-Evaluation - 01/17/17 1736      Psychosocial Re-Evaluation   Current issues with None Identified   Interventions Encouraged to  attend Cardiac Rehabilitation for the exercise   Continue Psychosocial Services  No Follow up required      Vocational Rehabilitation: Provide vocational rehab assistance to qualifying candidates.   Vocational Rehab Evaluation & Intervention:   Education: Education Goals: Education classes will be provided on a weekly basis, covering required topics. Participant will state understanding/return demonstration of topics presented.  Learning Barriers/Preferences:     Learning Barriers/Preferences - 11/30/16 1021      Learning  Barriers/Preferences   Learning Barriers Sight   Learning Preferences Written Material      Education Topics: Count Your Pulse:  -Group instruction provided by verbal instruction, demonstration, patient participation and written materials to support subject.  Instructors address importance of being able to find your pulse and how to count your pulse when at home without a heart monitor.  Patients get hands on experience counting their pulse with staff help and individually.   CARDIAC REHAB PHASE II EXERCISE from 01/10/2017 in [facility]  Date  12/29/16  Instruction Review Code  2- meets goals/outcomes      Heart Attack, Angina, and Risk Factor Modification:  -Group instruction provided by verbal instruction, video, and written materials to support subject.  Instructors address signs and symptoms of angina and heart attacks.    Also discuss risk factors for heart disease and how to make changes to improve heart health risk factors.   Functional Fitness:  -Group instruction provided by verbal instruction, demonstration, patient participation, and written materials to support subject.  Instructors address safety measures for doing things around the house.  Discuss how to get up and down off the floor, how to pick things up properly, how to safely get out of a chair without assistance, and balance training.   Meditation and Mindfulness:  -Group instruction provided by verbal instruction, patient participation, and written materials to support subject.  Instructor addresses importance of mindfulness and meditation practice to help reduce stress and improve awareness.  Instructor also leads participants through a meditation exercise.    CARDIAC REHAB PHASE II EXERCISE from 01/10/2017 in [facility]  Date  12/13/16  Instruction Review Code  2- meets goals/outcomes      Stretching for Flexibility and Mobility:  -Group instruction  provided by verbal instruction, patient participation, and written materials to support subject.  Instructors lead participants through series of stretches that are designed to increase flexibility thus improving mobility.  These stretches are additional exercise for major muscle groups that are typically performed during regular warm up and cool down.   Hands Only CPR:  -Group verbal, video, and participation provides a basic overview of AHA guidelines for community CPR. Role-play of emergencies allow participants the opportunity to practice calling for help and chest compression technique with discussion of AED use.   Hypertension: -Group verbal and written instruction that provides a basic overview of hypertension including the most recent diagnostic guidelines, risk factor reduction with self-care instructions and medication management.    Nutrition I class: Heart Healthy Eating:  -Group instruction provided by PowerPoint slides, verbal discussion, and written materials to support subject matter. The instructor gives an explanation and review of the Therapeutic Lifestyle Changes diet recommendations, which includes a discussion on lipid goals, dietary fat, sodium, fiber, plant stanol/sterol esters, sugar, and the components of a well-balanced, healthy diet.   CARDIAC REHAB PHASE II EXERCISE from 01/10/2017 in [facility]  Date  01/08/17  Educator  RD  Instruction Review Code  Not applicable [class handouts given]      Nutrition II class: Lifestyle Skills:  -Group instruction provided by PowerPoint slides, verbal discussion, and written materials to support subject matter. The instructor gives an explanation and review of label reading, grocery shopping for heart health, heart healthy recipe modifications, and ways to make healthier choices when eating out.   CARDIAC REHAB PHASE II EXERCISE from 01/10/2017 in [place]  Date   01/08/17  Educator  RD  Instruction Review Code  Not applicable [class handouts given]      Diabetes Question & Answer:  -Group instruction provided by PowerPoint slides, verbal discussion, and written materials to support subject matter. The instructor gives an explanation and review of diabetes co-morbidities, pre- and post-prandial blood glucose goals, pre-exercise blood glucose goals, signs, symptoms, and treatment of hypoglycemia and hyperglycemia, and foot care basics.   Diabetes Blitz:  -Group instruction provided by PowerPoint slides, verbal discussion, and written materials to support subject matter. The instructor gives an explanation and review of the physiology behind type 1 and type 2 diabetes, diabetes medications and rational behind using different medications, pre- and post-prandial blood glucose recommendations and Hemoglobin A1c goals, diabetes diet, and exercise including blood glucose guidelines for exercising safely.    Portion Distortion:  -Group instruction provided by PowerPoint slides, verbal discussion, written materials, and food models to support subject matter. The instructor gives an explanation of serving size versus portion size, changes in portions sizes over the last 20 years, and what consists of a serving from each food group.   Stress Management:  -Group instruction provided by verbal instruction, video, and written materials to support subject matter.  Instructors review role of stress in heart disease and how to cope with stress positively.     CARDIAC REHAB PHASE II EXERCISE from 01/10/2017 in [place]  Date  12/27/16  Instruction Review Code  2- meets goals/outcomes      Exercising on Your Own:  -Group instruction provided by verbal instruction, power point, and written materials to support subject.  Instructors discuss benefits of exercise, components of exercise, frequency and intensity of exercise, and end points  for exercise.  Also discuss use of nitroglycerin and activating EMS.  Review options of places to exercise outside of rehab.  Review guidelines for sex with heart disease.   CARDIAC REHAB PHASE II EXERCISE from 01/10/2017 in [facility]  Date  01/10/17  Instruction Review Code  2- meets goals/outcomes      Cardiac Drugs I:  -Group instruction provided by verbal instruction and written materials to support subject.  Instructor reviews cardiac drug classes: antiplatelets, anticoagulants, beta blockers, and statins.  Instructor discusses reasons, side effects, and lifestyle considerations for each drug class.   Cardiac Drugs II:  -Group instruction provided by verbal instruction and written materials to support subject.  Instructor reviews cardiac drug classes: angiotensin converting enzyme inhibitors (ACE-I), angiotensin II receptor blockers (ARBs), nitrates, and calcium channel blockers.  Instructor discusses reasons, side effects, and lifestyle considerations for each drug class.   Anatomy and Physiology of the Circulatory System:  Group verbal and written instruction and models provide basic cardiac anatomy and physiology, with the coronary electrical and arterial systems. Review of: AMI, Angina, Valve disease, Heart Failure, Peripheral Artery Disease, Cardiac Arrhythmia, Pacemakers, and the ICD.   Other Education:  -Group or individual verbal, written, or video instructions that support the educational  goals of the cardiac rehab program.   Knowledge Questionnaire Score:     Knowledge Questionnaire Score - 11/30/16 1345      Knowledge Questionnaire Score   Pre Score 18/24      Core Components/Risk Factors/Patient Goals at Admission:     Personal Goals and Risk Factors at Admission - 11/30/16 1124      Core Components/Risk Factors/Patient Goals on Admission   Lipids Yes   Intervention Provide education and support for participant on nutrition &  aerobic/resistive exercise along with prescribed medications to achieve LDL <86m, HDL >427m   Expected Outcomes Long Term: Cholesterol controlled with medications as prescribed, with individualized exercise RX and with personalized nutrition plan. Value goals: LDL < 7072mHDL > 40 mg.;Short Term: Participant states understanding of desired cholesterol values and is compliant with medications prescribed. Participant is following exercise prescription and nutrition guidelines.      Core Components/Risk Factors/Patient Goals Review:    Core Components/Risk Factors/Patient Goals at Discharge (Final Review):    ITP Comments:     ITP Comments    Row Name 11/30/16 0857           ITP Comments Medical Director, Dr. TraFransico Him       Comments: RhoMalayah making expected progress toward personal goals after completing 8 sessions. Recommend continued exercise and life style modification education including  stress management and relaxation techniques to decrease cardiac risk profile. Glendi's attendance has been sporadic due to family issues.MarBarnet PallN,BSN 01/17/2017 5:39 PM

## 2017-01-19 ENCOUNTER — Encounter (HOSPITAL_COMMUNITY): Payer: Medicare Other

## 2017-01-22 ENCOUNTER — Encounter (HOSPITAL_COMMUNITY): Payer: Medicare Other

## 2017-01-24 ENCOUNTER — Encounter (HOSPITAL_COMMUNITY): Payer: Medicare Other

## 2017-01-26 ENCOUNTER — Encounter (HOSPITAL_COMMUNITY): Payer: Medicare Other

## 2017-01-29 ENCOUNTER — Encounter (HOSPITAL_COMMUNITY): Payer: Medicare Other

## 2017-01-29 ENCOUNTER — Encounter (HOSPITAL_COMMUNITY)
Admission: RE | Admit: 2017-01-29 | Payer: Medicare Other | Source: Ambulatory Visit | Attending: Cardiology | Admitting: Cardiology

## 2017-01-31 ENCOUNTER — Encounter (HOSPITAL_COMMUNITY): Payer: Medicare Other

## 2017-02-01 ENCOUNTER — Encounter: Payer: Self-pay | Admitting: Internal Medicine

## 2017-02-01 ENCOUNTER — Ambulatory Visit (INDEPENDENT_AMBULATORY_CARE_PROVIDER_SITE_OTHER): Payer: Medicare Other | Admitting: Physician Assistant

## 2017-02-01 VITALS — BP 148/98 | HR 95 | Ht 65.5 in | Wt 124.0 lb

## 2017-02-01 DIAGNOSIS — R5383 Other fatigue: Secondary | ICD-10-CM | POA: Diagnosis not present

## 2017-02-01 DIAGNOSIS — I4892 Unspecified atrial flutter: Secondary | ICD-10-CM

## 2017-02-01 DIAGNOSIS — I2510 Atherosclerotic heart disease of native coronary artery without angina pectoris: Secondary | ICD-10-CM

## 2017-02-01 DIAGNOSIS — Z79899 Other long term (current) drug therapy: Secondary | ICD-10-CM

## 2017-02-01 DIAGNOSIS — I480 Paroxysmal atrial fibrillation: Secondary | ICD-10-CM

## 2017-02-01 DIAGNOSIS — Z950 Presence of cardiac pacemaker: Secondary | ICD-10-CM

## 2017-02-01 NOTE — Progress Notes (Signed)
Cardiology Office Note Date:  02/01/2017  Patient ID:  Mercedes, [name] 22-Oct-1955, MRN [id number] PCP:  Pincus Sanes, MD  Cardiologist:  Dr. Jens Som Electrophysiologist: Dr. Johney Frame   Chief Complaint: tired  History of Present Illness: Mercedes [name] is a 61 y.o. female with history of CAD with hx of PCI most recently CABG Nov 2017, HTN, HLD, PAFib, tachy-brady w/ PPM (leadless), SLE, RA and hx of Takotsubo CM, last echo with imporved EF.[name]    She comes in today to be seen for Dr. Johney Frame, last seen by him in March 2018, her pacer was noted to be RRT but found to be working in d/w SJM felt possibly 2/2 the CABG done in Nov.  Was decided to proceed conservatively given she is not device dependent and rarely paces with recommendations to see hi in 3 months..  The patient comes today reporting about 2 weeks of feeling unusually fatigued.  It is a constant feeling of no energy.  She sleeps well but wakes just as tired as when she went to bed. She denies any kind of CP of any kind, her pre-bypass symptoms were a feeling of terrible heartburn and she has not had any symptoms like this at all or even similar.  No palpitations, no SOB, no symptoms of PND or orthopnea.  She tells me she is doing cardiac rehab and feels like a champion there, but hasn't gone in a week or so, just doesn't fel like it.  She is not having episodic symptoms of weakness, she will sometimes feel fleetingly dizzy when bending forward or quick movements but this not new.  She feels like her appetite is good and is drinking/eating adequately.  She denies any bleeding or signs of bleeding.  Device History: STJ leadless PPM implanted 10/2014 for tachy/brady syndrome  Past Medical History:  Diagnosis Date  . Abnormal LFTs    a. in the past when on Lipitor  . Anemia    as a young woman  . Anxiety   . Arnold-Chiari malformation, type I (HCC)    MRI brain 02/2010  . CAD (coronary artery disease)    a. NSTEMI 07/2009:  LHC - D2 40%, LAD irreg., EF 50% with apical AK (tako-tsubo CM);  b. Inf STEMI (04/2013): Tx Promus DES to CFX;  c. 08/2013 Lexi CL: No ischemia, dist ant/ap/inf-ap infarct, EF  d. 06/2016 NSTEMI with LM disease: s/p CABG on 07/17/2016 w/ LIMA-LAD, SVG-OM, and SVG-dRCA.  [name] DEPRESSION   . Dizziness    ? CVA 01/2010 - carotid dopplers with no ICA stenosis  . GERD (gastroesophageal reflux disease)   . HYPERLIPIDEMIA   . HYPERTENSION   . MI (myocardial infarction) (HCC)   . PAD (peripheral artery disease) (HCC)    s/p L fem pop 11/2013 in setting of 1st toe osteo/gangrene  . Paroxysmal atrial fibrillation (HCC)    a. anticoagulated with Eliquis 10/2014  . RA (rheumatoid arthritis) (HCC)    prior tx by Dr. Dareen Piano  . Sjogren's syndrome (HCC) 06/02/13   pt denies this (12/01/13)  . SLE (systemic lupus erythematosus) (HCC) dx 05/2013   follows with rheum Dareen Piano)  . Tachy-brady syndrome (HCC)    a. s/p STJ Leadless pacemaker 11-04-2014 Dr Johney Frame  . 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 ABDOMINAL AORTAGRAM N/A 11/24/2013   Procedure: ABDOMINAL AORTAGRAM WITH  BILATERAL RUNOFF WITH POSSIBLE INTERVENTION;  Surgeon: Chuck Hint, MD;  Location: [facility] CATH LAB;  Service: Cardiovascular;  Laterality: N/A;  . AMPUTATION Left 12/02/2013   Procedure: LEFT 1ST TOE AMPUTATION;  Surgeon: Sherren Kerns, MD;  Location: [facility] OR;  Service: Vascular;  Laterality: Left;  . CARDIAC CATHETERIZATION N/A 07/12/2016   Procedure: Left Heart Cath and Coronary Angiography;  Surgeon: Lennette Bihari, MD;  Location: [facility] INVASIVE CV LAB;  Service: Cardiovascular;  Laterality: N/A;  . CARDIAC CATHETERIZATION N/A 07/14/2016   Procedure: Intravascular Ultrasound/IVUS;  Surgeon: Yvonne Kendall, MD;  Location: [facility] INVASIVE CV LAB;  Service: Cardiovascular;  Laterality: N/A;  . COLONOSCOPY    . CORONARY  ANGIOPLASTY  04/2013  . CORONARY ARTERY BYPASS GRAFT N/A 07/17/2016   Procedure: CORONARY ARTERY BYPASS GRAFTING (CABG)x3 using right greater saphenous vein harvested endoscopiclly and left internal mammary artery;  Surgeon: Kerin Perna, MD;  Location: [facility] OR;  Service: Open Heart Surgery;  Laterality: N/A;  . FEMORAL-POPLITEAL BYPASS GRAFT Left 12/02/2013   Procedure:  LEFT FEMORAL-BELOW KNEE POPLITEAL ARTERY BYPASS GRAFT;  Surgeon: Sherren Kerns, MD;  Location: [facility] OR;  Service: Vascular;  Laterality: Left;  . LEFT HEART CATHETERIZATION WITH CORONARY ANGIOGRAM N/A 05/19/2013   Procedure: LEFT HEART CATHETERIZATION WITH CORONARY ANGIOGRAM;  Surgeon: Micheline Chapman, MD;  Location: [facility] CATH LAB;  Service: Cardiovascular;  Laterality: N/A;  . LEFT HEART CATHETERIZATION WITH CORONARY ANGIOGRAM N/A 10/28/2014   Procedure: LEFT HEART CATHETERIZATION WITH CORONARY ANGIOGRAM;  Surgeon: Iran Ouch, MD;  Location: [facility] CATH LAB;  Service: Cardiovascular;  Laterality: N/A;  . PERCUTANEOUS CORONARY STENT INTERVENTION (PCI-S)  05/19/2013   Procedure: PERCUTANEOUS CORONARY STENT INTERVENTION (PCI-S);  Surgeon: Micheline Chapman, MD;  Location: [facility] CATH LAB;  Service: Cardiovascular;;  . PERMANENT PACEMAKER INSERTION N/A 11/04/2014   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  . TEE WITHOUT CARDIOVERSION N/A 07/17/2016   Procedure: TRANSESOPHAGEAL ECHOCARDIOGRAM (TEE);  Surgeon: Kerin Perna, MD;  Location: [facility] OR;  Service: Open Heart Surgery;  Laterality: N/A;  . TUBAL LIGATION      Current Outpatient Prescriptions  Medication Sig Dispense Refill  . acetaminophen (TYLENOL) 325 MG tablet Take 2 tablets (650 mg total) by mouth every 6 (six) hours as needed for mild pain. 30 tablet 0  . apixaban (ELIQUIS) 5 MG TABS tablet Take 1 tablet (5 mg total) by mouth 2 (two) times daily. 180 tablet 2  . aspirin EC 81 MG tablet Take 1 tablet (81 mg total) by mouth daily.    [name] ENSURE (ENSURE) Take  237 mLs by mouth daily.    [name] lisinopril (PRINIVIL,ZESTRIL) 5 MG tablet Take 1 tablet (5 mg total) by mouth daily. 90 tablet 2  . metoprolol tartrate (LOPRESSOR) 25 MG tablet Take 0.5 tablets (12.5 mg total) by mouth 2 (two) times daily. 90 tablet 2  . nitroGLYCERIN (NITROSTAT) 0.4 MG SL tablet Place 1 tablet (0.4 mg total) under the tongue every 5 (five) minutes as needed for chest pain. 90 tablet 3  . rosuvastatin (CRESTOR) 40 MG tablet Take 0.5 tablets (20 mg total) by mouth daily. 90 tablet 3   No current facility-administered medications for this visit.     Allergies:   Brilinta [ticagrelor]   Social History:  The patient  reports that she quit smoking about 8 months ago. Her smoking use included Cigarettes. She has never used smokeless tobacco. She reports that she drinks alcohol. She reports that she does  not use drugs.   Family History:  The patient's family history includes Arthritis in her father and mother; COPD in her father; Diabetes in her other; Emphysema in her mother.  ROS:  Please see the history of present illness.  All other systems are reviewed and otherwise negative.   PHYSICAL EXAM:  VS:  BP (!) 148/98   Pulse 95   Ht 5' 5.5" (1.664 m)   Wt 124 lb (56.2 kg)   BMI 20.32 kg/m  BMI: Body mass index is 20.32 kg/m. Well nourished, well developed, in no acute distress  HEENT: normocephalic, atraumatic  Neck: no JVD, carotid bruits or masses Cardiac:  RRR; no significant murmurs, no rubs, or gallops Lungs:  CTA b/l, no wheezing, rhonchi or rales  Abd: soft, nontender MS: no deformity or atrophy Ext: no edema  Skin: warm and dry, no rash Neuro:  No gross deficits appreciated Psych: euthymic mood, full affect   EKG:  Done today shows SR, PAC, no acute/ischemic looking changes PPM interrogation done today by SJM rep/research personel: prior RRT is resolved, battery/lead measurements are stable, 57% paced  07/17/16: TEE Result status: Final result   Left  ventricle: Normal cavity size. Concentric hypertrophy of mild severity. LV systolic function is normal with an EF of 60-65%. There are no obvious wall motion abnormalities. No thrombus present. No mass present.  Aortic valve: No AV vegetation.  Mitral valve: No leaflet thickening and calcification present. Trace regurgitation.  Right ventricle: Normal cavity size, wall thickness and ejection fraction.  Tricuspid valve: Trace regurgitation. The tricuspid valve regurgitation jet is central.  Pulmonic valve: Trace regurgitation.     Recent Labs: 07/18/2016: Magnesium 2.3 08/01/2016: B Natriuretic Peptide 115.9 09/29/2016: ALT 17; BUN 21; Creatinine, Ser 1.00; Hemoglobin 12.1; Platelets 375.0; Potassium 4.5; Sodium 137; TSH 0.25  07/12/2016: Cholesterol 146; HDL 47; LDL Cholesterol 86; Total CHOL/HDL Ratio 3.1; Triglycerides 66; VLDL 13   CrCl cannot be calculated (Patient's most recent lab result is older than the maximum 21 days allowed.).   Wt Readings from Last 3 Encounters:  02/01/17 124 lb (56.2 kg)  11/30/16 123 lb 7.3 oz (56 kg)  11/22/16 123 lb (55.8 kg)     Other studies reviewed: Additional studies/records reviewed today include: summarized above  ASSESSMENT AND PLAN:  1. Tachy-brady w/PPM (SJM leadless)     Pacemaker function remains intact   2. Paroxysmal AFib     CHA2DS2Vasc is at least 3, on Eliquis  3. CAD     She denies any symptoms of CP or symptoms similar to pre-bypass  4. HTN     A recheck is 140/70, she reports "white coat syndrome" and is usually better  5. Fatigue     Unclear etiology     Recommend labs and echo, but she declines echo.  Discussed rational for the echo but she doesn't want to pursue that yet.      Discussed seeing her PMD as well for any other evaluation      Her weight is stable, exam does not suggest fluid OL and not having any respiratory symptoms  Discussed case, reviewed EKG with DOD, Dr. Elease Hashimoto, agrees with plan, CBC/BMET, TSH,  LFTs, and echo, though patient declines the echo   Disposition: She is recommended to see her PMD as well for evaluation, will schedule cardiology f/u for the next couple weeks.    Current medicines are reviewed at length with the patient today.  The patient did not have any concerns regarding  medicines.  Mercedes Blonder, PA-C 02/01/2017 1:06 PM     [facility] [address] [place] [address] [phone] (office)  [phone] (fax)

## 2017-02-01 NOTE — Patient Instructions (Addendum)
Medication Instructions:    Your physician recommends that you continue on your current medications as directed. Please refer to the Current Medication list given to you today.    If you need a refill on your cardiac medications before your next appointment, please call your pharmacy.  Labwork:  BMET AND CBC  TODAY     Testing/Procedures: NONE ORDERED  TODAY    Follow-Up: WITH  [facility] OR AN AVAILABLE CARDIOLOGY APP IN 1 TO 2 WEEKS  Any Other Special Instructions Will Be Listed Below (If Applicable).   PLEASE FOLLOW UP   FOR FURTHER EVALUATION WITH PRIMARY CARE DOCTOR

## 2017-02-02 ENCOUNTER — Encounter (HOSPITAL_COMMUNITY): Payer: Medicare Other

## 2017-02-02 ENCOUNTER — Telehealth (HOSPITAL_COMMUNITY): Payer: Self-pay | Admitting: *Deleted

## 2017-02-02 ENCOUNTER — Telehealth: Payer: Self-pay | Admitting: Internal Medicine

## 2017-02-02 DIAGNOSIS — E0590 Thyrotoxicosis, unspecified without thyrotoxic crisis or storm: Secondary | ICD-10-CM

## 2017-02-02 LAB — CBC
HEMOGLOBIN: 13.4 g/dL (ref 11.1–15.9)
Hematocrit: 41.9 % (ref 34.0–46.6)
MCH: 24.7 pg — AB (ref 26.6–33.0)
MCHC: 32 g/dL (ref 31.5–35.7)
MCV: 77 fL — ABNORMAL LOW (ref 79–97)
PLATELETS: 346 10*3/uL (ref 150–379)
RBC: 5.42 x10E6/uL — AB (ref 3.77–5.28)
RDW: 16.9 % — ABNORMAL HIGH (ref 12.3–15.4)
WBC: 5.4 10*3/uL (ref 3.4–10.8)

## 2017-02-02 LAB — BASIC METABOLIC PANEL
BUN/Creatinine Ratio: 14 (ref 12–28)
BUN: 12 mg/dL (ref 8–27)
CALCIUM: 10.2 mg/dL (ref 8.7–10.3)
CO2: 23 mmol/L (ref 18–29)
Chloride: 103 mmol/L (ref 96–106)
Creatinine, Ser: 0.85 mg/dL (ref 0.57–1.00)
GFR calc Af Amer: 84 mL/min/{1.73_m2} (ref >59)
GFR, EST NON AFRICAN AMERICAN: 73 mL/min/{1.73_m2} (ref >59)
Glucose: 101 mg/dL — ABNORMAL HIGH (ref 65–99)
POTASSIUM: 5.2 mmol/L (ref 3.5–5.2)
SODIUM: 139 mmol/L (ref 134–144)

## 2017-02-02 LAB — TSH: TSH: 0.035 u[IU]/mL — AB (ref 0.450–4.500)

## 2017-02-02 NOTE — Telephone Encounter (Signed)
Spoke with pt, informed her to come back into the lab and the labs done that Dr Lawerance Bach ordered in Feb.

## 2017-02-02 NOTE — Telephone Encounter (Signed)
She was supposed to have repeat blood work done in march to recheck her thyroid and check her thyroid antibodies - she did not have this done.  She needs to come in and have that done.  I will refer her to endocrine today but it helps to have that blood work.

## 2017-02-02 NOTE — Telephone Encounter (Signed)
Spoke to [employer] [facility]. States the pt was in to see them yesterday and had labs done. Pts TSH was still low/ over active. Does pt need to come in for an OV?

## 2017-02-02 NOTE — Telephone Encounter (Signed)
Called and and states the Pt needs to call them to receive her lab wor results.

## 2017-02-05 ENCOUNTER — Encounter (HOSPITAL_COMMUNITY): Payer: Medicare Other

## 2017-02-07 ENCOUNTER — Encounter (HOSPITAL_COMMUNITY): Payer: Medicare Other

## 2017-02-09 ENCOUNTER — Encounter (HOSPITAL_COMMUNITY): Payer: Medicare Other

## 2017-02-12 ENCOUNTER — Encounter (HOSPITAL_COMMUNITY): Payer: Medicare Other

## 2017-02-14 ENCOUNTER — Encounter (HOSPITAL_COMMUNITY): Payer: Medicare Other

## 2017-02-15 ENCOUNTER — Encounter (HOSPITAL_COMMUNITY): Payer: Self-pay | Admitting: *Deleted

## 2017-02-15 ENCOUNTER — Ambulatory Visit: Payer: Medicare Other | Admitting: Physician Assistant

## 2017-02-15 DIAGNOSIS — I214 Non-ST elevation (NSTEMI) myocardial infarction: Secondary | ICD-10-CM

## 2017-02-15 DIAGNOSIS — Z951 Presence of aortocoronary bypass graft: Secondary | ICD-10-CM

## 2017-02-15 NOTE — Progress Notes (Signed)
Discharge Summary  Patient Details  Name: Mercedes [name] MRN: [id number] Date of Birth: 09-28-1955 Referring Provider:     CARDIAC REHAB PHASE II ORIENTATION from 11/30/2016 in [facility]ARD[facility]  Referring Provider  Mercedes Millers MD       Number of Visits: 8  Reason for Discharge:  Patient independent in their exercise.  Smoking History:  History  Smoking Status  . Former Smoker  . Types: Cigarettes  . Quit date: 05/19/2016  Smokeless Tobacco  . Never Used    Comment:      Diagnosis:  06/2016 NSTEMI (non-ST elevated myocardial infarction) (HCC)  07/17/16 S/P CABG x 3  ADL UCSD:   Initial Exercise Prescription:   Discharge Exercise Prescription (Final Exercise Prescription Changes):     Exercise Prescription Changes - 02/22/17 0900      Response to Exercise   Blood Pressure (Admit) 120/92   Blood Pressure (Exercise) 124/82   Blood Pressure (Exit) 120/80   Heart Rate (Admit) 79 bpm   Heart Rate (Exercise) 103 bpm   Heart Rate (Exit) 79 bpm   Rating of Perceived Exertion (Exercise) 12   Duration Progress to 45 minutes of aerobic exercise without signs/symptoms of physical distress   Intensity THRR unchanged     Progression   Progression Continue to progress workloads to maintain intensity without signs/symptoms of physical distress.   Average METs 2.5     Resistance Training   Training Prescription Yes   Weight 2   Reps 10-15     Treadmill   MPH 2   Grade 4   Minutes 10   METs 2.84     Recumbant Bike   Level 2   Minutes 10   METs 2.3     NuStep   Level 2   Minutes 10   METs 2.5     Home Exercise Plan   Plans to continue exercise at Home (comment)   Frequency Add 3 additional days to program exercise sessions.   Initial Home Exercises Provided 12/15/16      Functional Capacity:   Psychological, QOL, Others - Outcomes: PHQ 2/9: Depression screen PHQ 2/9 12/08/2016  Decreased Interest 0  Down, Depressed,  Hopeless 0  PHQ - 2 Score 0  Some recent data might be hidden    Quality of Life:   Personal Goals: Goals established at orientation with interventions provided to work toward goal.    Personal Goals Discharge:   Nutrition & Weight - Outcomes:      Post Biometrics - 02/22/17 0938       Post  Biometrics   Weight 125 lb 14.1 oz (57.1 kg)      Nutrition:     Nutrition Therapy & Goals - 01/09/17 1229      Nutrition Therapy   Diet Therapeutic Lifestyle Changes     Personal Nutrition Goals   Nutrition Goal Pt to maintain her UBW of 115-123 lb   Personal Goal #2 Pt to identify and limit food sources of saturated fat, trans fat, and sodium.     Intervention Plan   Intervention Prescribe, educate and counsel regarding individualized specific dietary modifications aiming towards targeted core components such as weight, hypertension, lipid management, diabetes, heart failure and other comorbidities.   Expected Outcomes Short Term Goal: Understand basic principles of dietary content, such as calories, fat, sodium, cholesterol and nutrients.;Long Term Goal: Adherence to prescribed nutrition plan.      Nutrition Discharge:     Nutrition  Assessments - 01/09/17 1229      MEDFICTS Scores   Pre Score 80      Education Questionnaire Score:   Mercedes [name] attended 8 exercise sessions between 4/518-01/10/2017. Mercedes [name] was discharged from cardiac rehab due to nonattendance. Mercedes [name]'s attendance was sporadic while she participated in the program.Mercedes [name] Mercedes Palmer RN BSN

## 2017-02-16 ENCOUNTER — Encounter (HOSPITAL_COMMUNITY): Payer: Medicare Other

## 2017-02-16 ENCOUNTER — Encounter (HOSPITAL_COMMUNITY)
Admission: RE | Admit: 2017-02-16 | Payer: Medicare Other | Source: Ambulatory Visit | Attending: Cardiology | Admitting: Cardiology

## 2017-02-19 ENCOUNTER — Encounter (HOSPITAL_COMMUNITY): Payer: Medicare Other

## 2017-02-20 ENCOUNTER — Encounter: Payer: Self-pay | Admitting: Endocrinology

## 2017-02-20 ENCOUNTER — Ambulatory Visit (INDEPENDENT_AMBULATORY_CARE_PROVIDER_SITE_OTHER): Payer: Medicare Other | Admitting: Endocrinology

## 2017-02-20 DIAGNOSIS — E0590 Thyrotoxicosis, unspecified without thyrotoxic crisis or storm: Secondary | ICD-10-CM | POA: Diagnosis not present

## 2017-02-20 MED ORDER — METHIMAZOLE 10 MG PO TABS: 10.0000 mg | ORAL_TABLET | Freq: Every day | ORAL | 2 refills | Status: DC

## 2017-02-20 NOTE — Progress Notes (Signed)
Subjective:    Patient ID: Mercedes [name], female    DOB: [birth year], 61 y.o.   MRN: [id number]  HPI Pt is referred by Dr Lawerance Bach, for hyperthyroidism.  Pt reports he was dx'ed with hyperthyroidism in early 2018.  she has never been on therapy for this.  she has never had XRT to the anterior neck, or thyroid surgery.  she has never had thyroid imaging.  she does not consume kelp or any non-prescribed thyroid medication.  she has never been on amiodarone.  She has slight palpitations in the chest, but no assoc fever.  Past Medical History:  Diagnosis Date  . Abnormal LFTs    a. in the past when on Lipitor  . Anemia    as a young woman  . Anxiety   . Arnold-Chiari malformation, type I (HCC)    MRI brain 02/2010  . CAD (coronary artery disease)    a. NSTEMI 07/2009: LHC - D2 40%, LAD irreg., EF 50% with apical AK (tako-tsubo CM);  b. Inf STEMI (04/2013): Tx Promus DES to CFX;  c. 08/2013 Lexi CL: No ischemia, dist ant/ap/inf-ap infarct, EF  d. 06/2016 NSTEMI with LM disease: s/p CABG on 07/17/2016 w/ LIMA-LAD, SVG-OM, and SVG-dRCA.  [name] DEPRESSION   . Dizziness    ? CVA 01/2010 - carotid dopplers with no ICA stenosis  . GERD (gastroesophageal reflux disease)   . HYPERLIPIDEMIA   . HYPERTENSION   . MI (myocardial infarction) (HCC)   . PAD (peripheral artery disease) (HCC)    s/p L fem pop 11/2013 in setting of 1st toe osteo/gangrene  . Paroxysmal atrial fibrillation (HCC)    a. anticoagulated with Eliquis 10/2014  . RA (rheumatoid arthritis) (HCC)    prior tx by Dr. Dareen Piano  . Sjogren's syndrome (HCC) 06/02/13   pt denies this (12/01/13)  . SLE (systemic lupus erythematosus) (HCC) dx 05/2013   follows with rheum Dareen Piano)  . Tachy-brady syndrome (HCC)    a. s/p STJ Leadless pacemaker 11-04-2014 Dr Johney Frame  . 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 ABDOMINAL AORTAGRAM  N/A 11/24/2013   Procedure: ABDOMINAL AORTAGRAM WITH BILATERAL RUNOFF WITH POSSIBLE INTERVENTION;  Surgeon: Chuck Hint, MD;  Location: [facility] CATH LAB;  Service: Cardiovascular;  Laterality: N/A;  . AMPUTATION Left 12/02/2013   Procedure: LEFT 1ST TOE AMPUTATION;  Surgeon: Sherren Kerns, MD;  Location: [facility] OR;  Service: Vascular;  Laterality: Left;  . CARDIAC CATHETERIZATION N/A 07/12/2016   Procedure: Left Heart Cath and Coronary Angiography;  Surgeon: Lennette Bihari, MD;  Location: [facility] INVASIVE CV LAB;  Service: Cardiovascular;  Laterality: N/A;  . CARDIAC CATHETERIZATION N/A 07/14/2016   Procedure: Intravascular Ultrasound/IVUS;  Surgeon: Yvonne Kendall, MD;  Location: [facility] INVASIVE CV LAB;  Service: Cardiovascular;  Laterality: N/A;  . COLONOSCOPY    . CORONARY ANGIOPLASTY  04/2013  . CORONARY ARTERY BYPASS GRAFT N/A 07/17/2016   Procedure: CORONARY ARTERY BYPASS GRAFTING (CABG)x3 using right greater saphenous vein harvested endoscopiclly and left internal mammary artery;  Surgeon: Kerin Perna, MD;  Location: [facility] OR;  Service: Open Heart Surgery;  Laterality: N/A;  . FEMORAL-POPLITEAL BYPASS GRAFT Left 12/02/2013   Procedure:  LEFT FEMORAL-BELOW KNEE POPLITEAL ARTERY BYPASS GRAFT;  Surgeon: Sherren Kerns, MD;  Location: [facility] OR;  Service: Vascular;  Laterality: Left;  . LEFT HEART CATHETERIZATION WITH CORONARY ANGIOGRAM N/A  05/19/2013   Procedure: LEFT HEART CATHETERIZATION WITH CORONARY ANGIOGRAM;  Surgeon: Micheline Chapman, MD;  Location: [facility] CATH LAB;  Service: Cardiovascular;  Laterality: N/A;  . LEFT HEART CATHETERIZATION WITH CORONARY ANGIOGRAM N/A 10/28/2014   Procedure: LEFT HEART CATHETERIZATION WITH CORONARY ANGIOGRAM;  Surgeon: Iran Ouch, MD;  Location: [facility] CATH LAB;  Service: Cardiovascular;  Laterality: N/A;  . PERCUTANEOUS CORONARY STENT INTERVENTION (PCI-S)  05/19/2013   Procedure: PERCUTANEOUS CORONARY STENT INTERVENTION (PCI-S);  Surgeon: Micheline Chapman, MD;  Location:  [facility] CATH LAB;  Service: Cardiovascular;;  . PERMANENT PACEMAKER INSERTION N/A 11/04/2014   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  . TEE WITHOUT CARDIOVERSION N/A 07/17/2016   Procedure: TRANSESOPHAGEAL ECHOCARDIOGRAM (TEE);  Surgeon: Kerin Perna, MD;  Location: [facility] OR;  Service: Open Heart Surgery;  Laterality: N/A;  . TUBAL LIGATION      Social History   Social History  . Marital status: Single    Spouse name: N/A  . Number of children: 3  . Years of education: N/A   Occupational History  . Not on file.   Social History Main Topics  . Smoking status: Former Smoker    Types: Cigarettes    Quit date: 05/19/2016  . Smokeless tobacco: Never Used     Comment:    . Alcohol use 0.0 oz/week  . Drug use: No  . Sexual activity: Not on file   Other Topics Concern  . Not on file   Social History Narrative   Lives alone.    Current Outpatient Prescriptions on File Prior to Visit  Medication Sig Dispense Refill  . acetaminophen (TYLENOL) 325 MG tablet Take 2 tablets (650 mg total) by mouth every 6 (six) hours as needed for mild pain. 30 tablet 0  . apixaban (ELIQUIS) 5 MG TABS tablet Take 1 tablet (5 mg total) by mouth 2 (two) times daily. 180 tablet 2  . aspirin EC 81 MG tablet Take 1 tablet (81 mg total) by mouth daily.    [name] ENSURE (ENSURE) Take 237 mLs by mouth daily.    [name] lisinopril (PRINIVIL,ZESTRIL) 5 MG tablet Take 1 tablet (5 mg total) by mouth daily. 90 tablet 2  . metoprolol tartrate (LOPRESSOR) 25 MG tablet Take 0.5 tablets (12.5 mg total) by mouth 2 (two) times daily. 90 tablet 2  . rosuvastatin (CRESTOR) 40 MG tablet Take 0.5 tablets (20 mg total) by mouth daily. 90 tablet 3  . nitroGLYCERIN (NITROSTAT) 0.4 MG SL tablet Place 1 tablet (0.4 mg total) under the tongue every 5 (five) minutes as needed for chest pain. (Patient not taking: Reported on 02/20/2017) 90 tablet 3   No current facility-administered medications on file prior to visit.      Allergies  Allergen Reactions  . Brilinta [Ticagrelor] Anaphylaxis    Also chest tightness    Family History  Problem Relation Age of Onset  . Arthritis Mother   . Emphysema Mother   . Arthritis Father   . COPD Father   . Diabetes Other   . Thyroid disease Neg Hx     BP 130/88 (BP Location: Left Arm, Patient Position: Sitting)   Pulse (!) 55   Ht 5\' 6"  (1.676 m)   Wt 126 lb (57.2 kg)   SpO2 98%   BMI 20.34 kg/m   Review of Systems denies weight loss, headache, hoarseness, diplopia, edema, sob, diarrhea, polyuria, muscle weakness, tremor, anxiety, heat intolerance, easy bruising, and rhinorrhea.  She has excessive diaphoresis.  Objective:   Physical Exam VS: see vs page GEN: no distress HEAD: head: no deformity eyes: no periorbital swelling, no proptosis external nose and ears are normal mouth: no lesion seen NECK: small multinodular goiter.  CHEST WALL: no deformity.  Old healed surgical scar (median sternotomy).   LUNGS: clear to auscultation.  CV: reg rate and rhythm, no murmur.   ABD: abdomen is soft, nontender.  no hepatosplenomegaly.  not distended.  no hernia MUSCULOSKELETAL: muscle bulk and strength are grossly normal.  no obvious joint swelling.  gait is normal and steady EXTEMITIES: no edema PULSES: no carotid bruit NEURO:  cn 2-12 grossly intact.   readily moves all 4's.  sensation is intact to touch on all 4's.  No tremor SKIN:  Normal texture and temperature.  No rash or suspicious lesion is visible.  Not diaphoretic   NODES:  None palpable at the neck PSYCH: alert, well-oriented.  Does not appear anxious nor depressed.   Lab Results  Component Value Date   TSH 0.035 (L) 02/01/2017    Chest CT (2015): makes no mention of the thyroid.  I personally reviewed electrocardiogram tracing (02/11/17): Indication: CAD Impression: NSR with PAC's.  No MI.  No hypertrophy.  LAHB Compared to 3/18: sinus arrhythmia is resolved     Assessment & Plan:   Hyperthyroidism, new, due to multinodular goiter.  We discussed rx options.  She chooses tapazole.   Bradycardia: we'll need to reduce metoprolol when thyroid is better.     Patient Instructions  I have sent a prescription to your pharmacy, to slow the thyroid. If ever you have fever while taking methimazole, stop it and call us, even if the reason is obvious, because of the risk of a rare side-effect.  Please come back for a follow-up appointment in 6 weeks.        Hyperthyroidism Hyperthyroidism is when the thyroid is too active (overactive). Your thyroid is a large gland that is located in your neck. The thyroid helps to control how your body uses food (metabolism). When your thyroid is overactive, it produces too much of a hormone called thyroxine. What are the causes? Causes of hyperthyroidism may include:  Graves disease. This is when your immune system attacks the thyroid gland. This is the most common cause.  Inflammation of the thyroid gland.  Tumor in the thyroid gland or somewhere else.  Excessive use of thyroid medicines, including: ? Prescription thyroid supplement. ? Herbal supplements that mimic thyroid hormones.  Solid or fluid-filled lumps within your thyroid gland (thyroid nodules).  Excessive ingestion of iodine.  What increases the risk?  Being female.  Having a family history of thyroid conditions. What are the signs or symptoms? Signs and symptoms of hyperthyroidism may include:  Nervousness.  Inability to tolerate heat.  Unexplained weight loss.  Diarrhea.  Change in the texture of hair or skin.  Heart skipping beats or making extra beats.  Rapid heart rate.  Loss of menstruation.  Shaky hands.  Fatigue.  Restlessness.  Increased appetite.  Sleep problems.  Enlarged thyroid gland or nodules.  How is this diagnosed? Diagnosis of hyperthyroidism may include:  Medical history and physical exam.  Blood tests.  Ultrasound  tests.  How is this treated? Treatment may include:  Medicines to control your thyroid.  Surgery to remove your thyroid.  Radiation therapy.  Follow these instructions at home:  Take medicines only as directed by your health care provider.  Do not use any tobacco products, including cigarettes,  chewing tobacco, or electronic cigarettes. If you need help quitting, ask your health care provider.  Do not exercise or do physical activity until your health care provider approves.  Keep all follow-up appointments as directed by your health care provider. This is important. Contact a health care provider if:  Your symptoms do not get better with treatment.  You have fever.  You are taking thyroid replacement medicine and you: ? Have depression. ? Feel mentally and physically slow. ? Have weight gain. Get help right away if:  You have decreased alertness or a change in your awareness.  You have abdominal pain.  You feel dizzy.  You have a rapid heartbeat.  You have an irregular heartbeat. This information is not intended to replace advice given to you by your health care provider. Make sure you discuss any questions you have with your health care provider. Document Released: 08/14/2005 Document Revised: 01/13/2016 Document Reviewed: 12/30/2013 Elsevier Interactive Patient Education  2017 [employer].

## 2017-02-20 NOTE — Patient Instructions (Addendum)
I have sent a prescription to your pharmacy, to slow the thyroid. If ever you have fever while taking methimazole, stop it and call us, even if the reason is obvious, because of the risk of a rare side-effect.  Please come back for a follow-up appointment in 6 weeks.        Hyperthyroidism Hyperthyroidism is when the thyroid is too active (overactive). Your thyroid is a large gland that is located in your neck. The thyroid helps to control how your body uses food (metabolism). When your thyroid is overactive, it produces too much of a hormone called thyroxine. What are the causes? Causes of hyperthyroidism may include:  Graves disease. This is when your immune system attacks the thyroid gland. This is the most common cause.  Inflammation of the thyroid gland.  Tumor in the thyroid gland or somewhere else.  Excessive use of thyroid medicines, including: ? Prescription thyroid supplement. ? Herbal supplements that mimic thyroid hormones.  Solid or fluid-filled lumps within your thyroid gland (thyroid nodules).  Excessive ingestion of iodine.  What increases the risk?  Being female.  Having a family history of thyroid conditions. What are the signs or symptoms? Signs and symptoms of hyperthyroidism may include:  Nervousness.  Inability to tolerate heat.  Unexplained weight loss.  Diarrhea.  Change in the texture of hair or skin.  Heart skipping beats or making extra beats.  Rapid heart rate.  Loss of menstruation.  Shaky hands.  Fatigue.  Restlessness.  Increased appetite.  Sleep problems.  Enlarged thyroid gland or nodules.  How is this diagnosed? Diagnosis of hyperthyroidism may include:  Medical history and physical exam.  Blood tests.  Ultrasound tests.  How is this treated? Treatment may include:  Medicines to control your thyroid.  Surgery to remove your thyroid.  Radiation therapy.  Follow these instructions at home:  Take  medicines only as directed by your health care provider.  Do not use any tobacco products, including cigarettes, chewing tobacco, or electronic cigarettes. If you need help quitting, ask your health care provider.  Do not exercise or do physical activity until your health care provider approves.  Keep all follow-up appointments as directed by your health care provider. This is important. Contact a health care provider if:  Your symptoms do not get better with treatment.  You have fever.  You are taking thyroid replacement medicine and you: ? Have depression. ? Feel mentally and physically slow. ? Have weight gain. Get help right away if:  You have decreased alertness or a change in your awareness.  You have abdominal pain.  You feel dizzy.  You have a rapid heartbeat.  You have an irregular heartbeat. This information is not intended to replace advice given to you by your health care provider. Make sure you discuss any questions you have with your health care provider. Document Released: 08/14/2005 Document Revised: 01/13/2016 Document Reviewed: 12/30/2013 Elsevier Interactive Patient Education  2017 [employer].

## 2017-02-21 ENCOUNTER — Encounter (HOSPITAL_COMMUNITY): Payer: Medicare Other

## 2017-02-21 DIAGNOSIS — E0590 Thyrotoxicosis, unspecified without thyrotoxic crisis or storm: Secondary | ICD-10-CM | POA: Insufficient documentation

## 2017-02-22 NOTE — Addendum Note (Signed)
Encounter addended by: Ples Specter on: 02/22/2017 10:01 AM<BR>    Actions taken: Flowsheet data copied forward, Flowsheet accepted, Visit Navigator Flowsheet section accepted

## 2017-02-23 ENCOUNTER — Encounter (HOSPITAL_COMMUNITY): Payer: Medicare Other

## 2017-03-19 ENCOUNTER — Ambulatory Visit (INDEPENDENT_AMBULATORY_CARE_PROVIDER_SITE_OTHER): Payer: Medicare Other | Admitting: Internal Medicine

## 2017-03-19 ENCOUNTER — Encounter: Payer: Self-pay | Admitting: Internal Medicine

## 2017-03-19 VITALS — BP 150/98 | HR 71 | Ht 65.5 in | Wt 124.0 lb

## 2017-03-19 DIAGNOSIS — I480 Paroxysmal atrial fibrillation: Secondary | ICD-10-CM

## 2017-03-19 DIAGNOSIS — I10 Essential (primary) hypertension: Secondary | ICD-10-CM

## 2017-03-19 DIAGNOSIS — Z951 Presence of aortocoronary bypass graft: Secondary | ICD-10-CM

## 2017-03-19 DIAGNOSIS — I495 Sick sinus syndrome: Secondary | ICD-10-CM | POA: Diagnosis not present

## 2017-03-19 LAB — CUP PACEART INCLINIC DEVICE CHECK
Date Time Interrogation Session: 20180723130032
Implantable Pulse Generator Implant Date: 20160309
MDC IDC SET LEADCHNL RV PACING AMPLITUDE: 2 V
MDC IDC SET LEADCHNL RV PACING PULSEWIDTH: 0.4 ms
MDC IDC SET LEADCHNL RV SENSING SENSITIVITY: 2 mV

## 2017-03-19 NOTE — Progress Notes (Signed)
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 female who presents today for routine electrophysiology followup.  Since last being seen in our clinic, the patient reports doing very well.  She has been diagnosed with hyperthyroidism but has not been compliant with treatment.  Today, she denies symptoms of palpitations, chest pain, shortness of breath,  lower extremity edema, dizziness, presyncope, or syncope.  The patient is otherwise without complaint today.   Past Medical History:  Diagnosis Date  . Abnormal LFTs    a. in the past when on Lipitor  . Anemia    as a young woman  . Anxiety   . Arnold-Chiari malformation, type I (HCC)    MRI brain 02/2010  . CAD (coronary artery disease)    a. NSTEMI 07/2009: LHC - D2 40%, LAD irreg., EF 50% with apical AK (tako-tsubo CM);  b. Inf STEMI (04/2013): Tx Promus DES to CFX;  c. 08/2013 Lexi CL: No ischemia, dist ant/ap/inf-ap infarct, EF  d. 06/2016 NSTEMI with LM disease: s/p CABG on 07/17/2016 w/ LIMA-LAD, SVG-OM, and SVG-dRCA.  [name] DEPRESSION   . Dizziness    ? CVA 01/2010 - carotid dopplers with no ICA stenosis  . GERD (gastroesophageal reflux disease)   . HYPERLIPIDEMIA   . HYPERTENSION   . MI (myocardial infarction) (HCC)   . PAD (peripheral artery disease) (HCC)    s/p L fem pop 11/2013 in setting of 1st toe osteo/gangrene  . Paroxysmal atrial fibrillation (HCC)    a. anticoagulated with Eliquis 10/2014  . RA (rheumatoid arthritis) (HCC)    prior tx by Dr. Dareen Piano  . Sjogren's syndrome (HCC) 06/02/13   pt denies this (12/01/13)  . SLE (systemic lupus erythematosus) (HCC) dx 05/2013   follows with rheum Dareen Piano)  . Tachy-brady syndrome (HCC)    a. s/p STJ Leadless pacemaker 11-04-2014 Dr Johney Frame  . 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 ABDOMINAL AORTAGRAM N/A 11/24/2013    Procedure: ABDOMINAL AORTAGRAM WITH BILATERAL RUNOFF WITH POSSIBLE INTERVENTION;  Surgeon: Chuck Hint, MD;  Location: [facility] CATH LAB;  Service: Cardiovascular;  Laterality: N/A;  . AMPUTATION Left 12/02/2013   Procedure: LEFT 1ST TOE AMPUTATION;  Surgeon: Sherren Kerns, MD;  Location: [facility] OR;  Service: Vascular;  Laterality: Left;  . CARDIAC CATHETERIZATION N/A 07/12/2016   Procedure: Left Heart Cath and Coronary Angiography;  Surgeon: Lennette Bihari, MD;  Location: [facility] INVASIVE CV LAB;  Service: Cardiovascular;  Laterality: N/A;  . CARDIAC CATHETERIZATION N/A 07/14/2016   Procedure: Intravascular Ultrasound/IVUS;  Surgeon: Yvonne Kendall, MD;  Location: [facility] INVASIVE CV LAB;  Service: Cardiovascular;  Laterality: N/A;  . COLONOSCOPY    . CORONARY ANGIOPLASTY  04/2013  . CORONARY ARTERY BYPASS GRAFT N/A 07/17/2016   Procedure: CORONARY ARTERY BYPASS GRAFTING (CABG)x3 using right greater saphenous vein harvested endoscopiclly and left internal mammary artery;  Surgeon: Kerin Perna, MD;  Location: [facility] OR;  Service: Open Heart Surgery;  Laterality: N/A;  . FEMORAL-POPLITEAL BYPASS GRAFT Left 12/02/2013   Procedure:  LEFT FEMORAL-BELOW KNEE POPLITEAL ARTERY BYPASS GRAFT;  Surgeon: Sherren Kerns, MD;  Location: [facility] OR;  Service: Vascular;  Laterality: Left;  . LEFT HEART CATHETERIZATION WITH CORONARY ANGIOGRAM N/A 05/19/2013   Procedure: LEFT HEART CATHETERIZATION WITH CORONARY ANGIOGRAM;  Surgeon: Micheline Chapman, MD;  Location: [facility] CATH  LAB;  Service: Cardiovascular;  Laterality: N/A;  . LEFT HEART CATHETERIZATION WITH CORONARY ANGIOGRAM N/A 10/28/2014   Procedure: LEFT HEART CATHETERIZATION WITH CORONARY ANGIOGRAM;  Surgeon: Iran Ouch, MD;  Location: [facility] CATH LAB;  Service: Cardiovascular;  Laterality: N/A;  . PERCUTANEOUS CORONARY STENT INTERVENTION (PCI-S)  05/19/2013   Procedure: PERCUTANEOUS CORONARY STENT INTERVENTION (PCI-S);  Surgeon: Micheline Chapman, MD;  Location: [facility] CATH LAB;   Service: Cardiovascular;;  . PERMANENT PACEMAKER INSERTION N/A 11/04/2014   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  . TEE WITHOUT CARDIOVERSION N/A 07/17/2016   Procedure: TRANSESOPHAGEAL ECHOCARDIOGRAM (TEE);  Surgeon: Kerin Perna, MD;  Location: [facility] OR;  Service: Open Heart Surgery;  Laterality: N/A;  . TUBAL LIGATION      ROS- all systems are reviewed and negative except as per HPI above  Current Outpatient Prescriptions  Medication Sig Dispense Refill  . acetaminophen (TYLENOL) 325 MG tablet Take 2 tablets (650 mg total) by mouth every 6 (six) hours as needed for mild pain. 30 tablet 0  . apixaban (ELIQUIS) 5 MG TABS tablet Take 1 tablet (5 mg total) by mouth 2 (two) times daily. 180 tablet 2  . aspirin EC 81 MG tablet Take 1 tablet (81 mg total) by mouth daily.    [name] ENSURE (ENSURE) Take 237 mLs by mouth daily.    [name] lisinopril (PRINIVIL,ZESTRIL) 5 MG tablet Take 1 tablet (5 mg total) by mouth daily. 90 tablet 2  . methimazole (TAPAZOLE) 10 MG tablet Take 1 tablet (10 mg total) by mouth daily. 30 tablet 2  . metoprolol tartrate (LOPRESSOR) 25 MG tablet Take 0.5 tablets (12.5 mg total) by mouth 2 (two) times daily. 90 tablet 2  . rosuvastatin (CRESTOR) 40 MG tablet Take 0.5 tablets (20 mg total) by mouth daily. 90 tablet 3  . nitroGLYCERIN (NITROSTAT) 0.4 MG SL tablet Place 1 tablet (0.4 mg total) under the tongue every 5 (five) minutes as needed for chest pain. (Patient not taking: Reported on 02/20/2017) 90 tablet 3   No current facility-administered medications for this visit.     Physical Exam: Vitals:   03/19/17 1055  BP: (!) 150/98  Pulse: 71  SpO2: 97%  Weight: 124 lb (56.2 kg)  Height: 5' 5.5" (1.664 m)    GEN- The patient is well appearing, alert and oriented x 3 today.   Head- normocephalic, atraumatic Eyes-  Sclera clear, conjunctiva pink Ears- hearing intact Oropharynx- clear Lungs- Clear to ausculation bilaterally, normal work of  breathing Heart- Regular rate and rhythm, no murmurs, rubs or gallops, PMI not laterally displaced GI- soft, NT, ND, + BS Extremities- no clubbing, cyanosis, or edema  Pacemaker interrogation- reviewed in detail today,  See PACEART report  Assessment and Plan:  1. Bradycardia/ tachycardia Normal pacemaker function See Pace Art report No changes today  2. Paroxysmal atrial fibrillation Stable I have advised compliance with therapy for hyperthyroidism.  Hopefully this will reduce afib episodes as well as RVR. On eliquis  3. CAD S/p CABG Doing well Smoking cessation advised Could consider stopping ASA as she is stable and on eliquis but will defer to Dr Jens Som  4. HTN Elevated today She reports good BP control at home and is not interested in medicine change today.  Follow-up with Dr Jens Som as scheduled Return to see EP NP in 6 months I will see in a year unless problems arise  Hillis Range MD, [facility] 03/19/2017 11:41 AM

## 2017-03-19 NOTE — Patient Instructions (Addendum)
Medication Instructions:  Your physician recommends that you continue on your current medications as directed. Please refer to the Current Medication list given to you today.   Labwork: None ordered   Testing/Procedures: None ordered    Follow-Up:  Your physician recommends that you schedule a follow-up appointment next available with Dr Jens Som (due in Aug).  Your physician wants you to follow-up in: 6 months with [name], NP and 12 months with Dr Jacquiline Doe will receive a reminder letter in the mail two months in advance. If you don't receive a letter, please call our office to schedule the follow-up appointment.     Any Other Special Instructions Will Be Listed Below (If Applicable).     If you need a refill on your cardiac medications before your next appointment, please call your pharmacy.

## 2017-03-28 NOTE — Progress Notes (Signed)
Subjective:    Patient ID: Mercedes [name], female    DOB: 1955-12-25, 61 y.o.   MRN: [id number]  HPI The patient is here for follow up.  CAD s/p MI x3, s/p CABG, Afib, hyperlipidemia, PVD, Hypertension: She is taking her medication daily. She is compliant with a low sodium diet.  She denies chest pain, edema, regular shortness of breath and regular headaches. She is not exercising regularly.     Prediabetes:  She is more compliant with a low sugar/carbohydrate diet.  She is not exercising regularly.  Hyperlipidemia: She is taking her medication daily. She is compliant with a low fat/cholesterol diet. She is not exercising regularly. She denies myalgias.   Hyperthyroidism:  She saw dr Everardo All.  She is not taking all of her medication as prescribed.  She never took it.  She was not sure if she truly needed the medication and did not feel comfortable taking it.  Medications and allergies reviewed with patient and updated if appropriate.  Patient Active Problem List   Diagnosis Date Noted  . Hyperthyroidism 02/21/2017  . Low mean corpuscular volume (MCV) 09/30/2016  . Prediabetes 09/29/2016  . Coronary artery disease involving native heart without angina pectoris   . Hx of CABG 07/17/2016  . Paroxysmal atrial fibrillation (HCC) 07/12/2016  . Chest pain 07/11/2016  . NSTEMI (non-ST elevated myocardial infarction) (HCC) 07/11/2016  . History of arterial bypass of lower extremity 12/30/2015  . Claudication of right lower extremity (HCC) 12/30/2015  . Phantom limb pain-Left great toe 04/29/2015  . Seizure (HCC)   . Atrial flutter with rapid ventricular response vs. SVT 10/27/2014    Class: Acute  . 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 PAD (peripheral artery disease) (HCC) 12/02/2013  . Unstable angina-jaw pain 09/03/2013  . SLE (systemic lupus erythematosus) (HCC)   . CAD - S/P Takotsubo MI in 2000, CFX DES Sept 2014 05/25/2013    . Acute MI, inferior wall (HCC) 05/19/2013  . 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 Tachycardia-bradycardia (HCC) 03/09/2010  . PVD- s/p Lt [facility] April 2015 10/29/2009  . Dyslipidemia 08/26/2009  . Essential hypertension 08/26/2009  . CORONARY ATHEROSCLEROSIS NATIVE CORONARY ARTERY 08/26/2009  . 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 Rheumatoid arthritis (HCC) 08/26/2009    Current Outpatient Prescriptions on File Prior to Visit  Medication Sig Dispense Refill  . acetaminophen (TYLENOL) 325 MG tablet Take 2 tablets (650 mg total) by mouth every 6 (six) hours as needed for mild pain. 30 tablet 0  . apixaban (ELIQUIS) 5 MG TABS tablet Take 1 tablet (5 mg total) by mouth 2 (two) times daily. 180 tablet 2  . aspirin EC 81 MG tablet Take 1 tablet (81 mg total) by mouth daily.    [name] ENSURE (ENSURE) Take 237 mLs by mouth daily.    [name] lisinopril (PRINIVIL,ZESTRIL) 5 MG tablet Take 1 tablet (5 mg total) by mouth daily. 90 tablet 2  . metoprolol tartrate (LOPRESSOR) 25 MG tablet Take 0.5 tablets (12.5 mg total) by mouth 2 (two) times daily. 90 tablet 2  . rosuvastatin (CRESTOR) 40 MG tablet Take 0.5 tablets (20 mg total) by mouth daily. 90 tablet 3  . methimazole (TAPAZOLE) 10 MG tablet Take 1 tablet (10 mg total) by mouth daily. (Patient not taking: Reported on 03/29/2017) 30 tablet 2  . nitroGLYCERIN (NITROSTAT) 0.4 MG SL tablet Place 1 tablet (0.4 mg total) under the tongue every 5 (five) minutes as needed for chest  pain. (Patient not taking: Reported on 02/20/2017) 90 tablet 3   No current facility-administered medications on file prior to visit.     Past Medical History:  Diagnosis Date  . Abnormal LFTs    a. in the past when on Lipitor  . Anemia    as a young woman  . Anxiety   . Arnold-Chiari malformation, type I (HCC)    MRI brain 02/2010  . CAD (coronary artery disease)    a. NSTEMI 07/2009: LHC - D2 40%, LAD irreg., EF 50% with apical AK (tako-tsubo CM);  b. Inf STEMI (04/2013):  Tx Promus DES to CFX;  c. 08/2013 Lexi CL: No ischemia, dist ant/ap/inf-ap infarct, EF  d. 06/2016 NSTEMI with LM disease: s/p CABG on 07/17/2016 w/ LIMA-LAD, SVG-OM, and SVG-dRCA.  [name] DEPRESSION   . Dizziness    ? CVA 01/2010 - carotid dopplers with no ICA stenosis  . GERD (gastroesophageal reflux disease)   . HYPERLIPIDEMIA   . HYPERTENSION   . MI (myocardial infarction) (HCC)   . PAD (peripheral artery disease) (HCC)    s/p L fem pop 11/2013 in setting of 1st toe osteo/gangrene  . Paroxysmal atrial fibrillation (HCC)    a. anticoagulated with Eliquis 10/2014  . RA (rheumatoid arthritis) (HCC)    prior tx by Dr. Dareen Piano  . Sjogren's syndrome (HCC) 06/02/13   pt denies this (12/01/13)  . SLE (systemic lupus erythematosus) (HCC) dx 05/2013   follows with rheum Dareen Piano)  . Tachy-brady syndrome (HCC)    a. s/p STJ Leadless pacemaker 11-04-2014 Dr Johney Frame  . 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 ABDOMINAL AORTAGRAM N/A 11/24/2013   Procedure: ABDOMINAL AORTAGRAM WITH BILATERAL RUNOFF WITH POSSIBLE INTERVENTION;  Surgeon: Chuck Hint, MD;  Location: [facility] CATH LAB;  Service: Cardiovascular;  Laterality: N/A;  . AMPUTATION Left 12/02/2013   Procedure: LEFT 1ST TOE AMPUTATION;  Surgeon: Sherren Kerns, MD;  Location: [facility] OR;  Service: Vascular;  Laterality: Left;  . CARDIAC CATHETERIZATION N/A 07/12/2016   Procedure: Left Heart Cath and Coronary Angiography;  Surgeon: Lennette Bihari, MD;  Location: [facility] INVASIVE CV LAB;  Service: Cardiovascular;  Laterality: N/A;  . CARDIAC CATHETERIZATION N/A 07/14/2016   Procedure: Intravascular Ultrasound/IVUS;  Surgeon: Yvonne Kendall, MD;  Location: [facility] INVASIVE CV LAB;  Service: Cardiovascular;  Laterality: N/A;  . COLONOSCOPY    . CORONARY ANGIOPLASTY  04/2013  . CORONARY ARTERY BYPASS GRAFT N/A 07/17/2016   Procedure: CORONARY  ARTERY BYPASS GRAFTING (CABG)x3 using right greater saphenous vein harvested endoscopiclly and left internal mammary artery;  Surgeon: Kerin Perna, MD;  Location: [facility] OR;  Service: Open Heart Surgery;  Laterality: N/A;  . FEMORAL-POPLITEAL BYPASS GRAFT Left 12/02/2013   Procedure:  LEFT FEMORAL-BELOW KNEE POPLITEAL ARTERY BYPASS GRAFT;  Surgeon: Sherren Kerns, MD;  Location: [facility] OR;  Service: Vascular;  Laterality: Left;  . LEFT HEART CATHETERIZATION WITH CORONARY ANGIOGRAM N/A 05/19/2013   Procedure: LEFT HEART CATHETERIZATION WITH CORONARY ANGIOGRAM;  Surgeon: Micheline Chapman, MD;  Location: [facility] CATH LAB;  Service: Cardiovascular;  Laterality: N/A;  . LEFT HEART CATHETERIZATION WITH CORONARY ANGIOGRAM N/A 10/28/2014   Procedure: LEFT HEART CATHETERIZATION WITH CORONARY ANGIOGRAM;  Surgeon: Iran Ouch, MD;  Location: [facility] CATH LAB;  Service: Cardiovascular;  Laterality: N/A;  . PERCUTANEOUS CORONARY STENT INTERVENTION (PCI-S)  05/19/2013   Procedure: PERCUTANEOUS CORONARY  STENT INTERVENTION (PCI-S);  Surgeon: Micheline Chapman, MD;  Location: [facility] CATH LAB;  Service: Cardiovascular;;  . PERMANENT PACEMAKER INSERTION N/A 11/04/2014   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  . TEE WITHOUT CARDIOVERSION N/A 07/17/2016   Procedure: TRANSESOPHAGEAL ECHOCARDIOGRAM (TEE);  Surgeon: Kerin Perna, MD;  Location: [facility] OR;  Service: Open Heart Surgery;  Laterality: N/A;  . TUBAL LIGATION      Social History   Social History  . Marital status: Single    Spouse name: N/A  . Number of children: 3  . Years of education: N/A   Social History Main Topics  . Smoking status: Former Smoker    Types: Cigarettes    Quit date: 05/19/2016  . Smokeless tobacco: Never Used     Comment:    . Alcohol use 0.0 oz/week  . Drug use: No  . Sexual activity: Not on file   Other Topics Concern  . Not on file   Social History Narrative   Lives alone.    Family History  Problem Relation Age  of Onset  . Arthritis Mother   . Emphysema Mother   . Arthritis Father   . COPD Father   . Diabetes Other   . Thyroid disease Neg Hx     Review of Systems  Constitutional: Negative for appetite change, chills and fever.       Has been slowly gaining weight  Respiratory: Positive for cough and shortness of breath (occasional, if moves too fast). Negative for wheezing.   Cardiovascular: Positive for palpitations. Negative for chest pain and leg swelling.  Neurological: Negative for light-headedness and headaches.       Objective:   Vitals:   03/29/17 1104  BP: 140/88  Pulse: 98  Resp: 16  Temp: 98.8 F (37.1 C)   Wt Readings from Last 3 Encounters:  03/29/17 125 lb (56.7 kg)  03/19/17 124 lb (56.2 kg)  02/20/17 126 lb (57.2 kg)   Body mass index is 20.48 kg/m.   Physical Exam    Constitutional: Appears well-developed and well-nourished. No distress.  HENT:  Head: Normocephalic and atraumatic.  Neck: Neck supple. No tracheal deviation present. No thyromegaly present.  No cervical lymphadenopathy Cardiovascular: Normal rate, regular rhythm and normal heart sounds.   No murmur heard. No carotid bruit .  No edema Pulmonary/Chest: Effort normal and breath sounds normal. No respiratory distress. No has no wheezes. No rales.  Skin: Skin is warm and dry. Not diaphoretic.  Psychiatric: Normal mood and affect. Behavior is normal.      Assessment & Plan:    See Problem List for Assessment and Plan of chronic medical problems.

## 2017-03-28 NOTE — Patient Instructions (Addendum)
  Test(s) ordered today. Your results will be released to MyChart (or called to you) after review, usually within 72hours after test completion. If any changes need to be made, you will be notified at that same time.  Medications reviewed and updated.  No changes recommended at this time.    Please followup in 6 months   

## 2017-03-29 ENCOUNTER — Encounter: Payer: Self-pay | Admitting: Internal Medicine

## 2017-03-29 ENCOUNTER — Ambulatory Visit (INDEPENDENT_AMBULATORY_CARE_PROVIDER_SITE_OTHER): Payer: Medicare Other | Admitting: Internal Medicine

## 2017-03-29 ENCOUNTER — Other Ambulatory Visit (INDEPENDENT_AMBULATORY_CARE_PROVIDER_SITE_OTHER): Payer: Medicare Other

## 2017-03-29 VITALS — BP 140/88 | HR 98 | Temp 98.8°F | Resp 16 | Wt 125.0 lb

## 2017-03-29 DIAGNOSIS — I2510 Atherosclerotic heart disease of native coronary artery without angina pectoris: Secondary | ICD-10-CM | POA: Diagnosis not present

## 2017-03-29 DIAGNOSIS — E785 Hyperlipidemia, unspecified: Secondary | ICD-10-CM | POA: Diagnosis not present

## 2017-03-29 DIAGNOSIS — R7303 Prediabetes: Secondary | ICD-10-CM | POA: Diagnosis not present

## 2017-03-29 DIAGNOSIS — E0590 Thyrotoxicosis, unspecified without thyrotoxic crisis or storm: Secondary | ICD-10-CM

## 2017-03-29 DIAGNOSIS — I10 Essential (primary) hypertension: Secondary | ICD-10-CM

## 2017-03-29 DIAGNOSIS — I480 Paroxysmal atrial fibrillation: Secondary | ICD-10-CM

## 2017-03-29 LAB — HEMOGLOBIN A1C: HEMOGLOBIN A1C: 6.5 % (ref 4.6–6.5)

## 2017-03-29 LAB — COMPREHENSIVE METABOLIC PANEL
ALBUMIN: 4.2 g/dL (ref 3.5–5.2)
ALT: 15 U/L (ref 0–35)
AST: 19 U/L (ref 0–37)
Alkaline Phosphatase: 85 U/L (ref 39–117)
BILIRUBIN TOTAL: 0.5 mg/dL (ref 0.2–1.2)
BUN: 13 mg/dL (ref 6–23)
CALCIUM: 10.2 mg/dL (ref 8.4–10.5)
CO2: 27 meq/L (ref 19–32)
Chloride: 104 mEq/L (ref 96–112)
Creatinine, Ser: 0.99 mg/dL (ref 0.40–1.20)
GFR: 72.81 mL/min (ref >60.00)
Glucose, Bld: 105 mg/dL — ABNORMAL HIGH (ref 70–99)
Potassium: 4.4 mEq/L (ref 3.5–5.1)
Sodium: 136 mEq/L (ref 135–145)
Total Protein: 8.5 g/dL — ABNORMAL HIGH (ref 6.0–8.3)

## 2017-03-29 LAB — T4, FREE: Free T4: 1.02 ng/dL (ref 0.60–1.60)

## 2017-03-29 LAB — T3, FREE: T3 FREE: 3 pg/mL (ref 2.3–4.2)

## 2017-03-29 LAB — TSH: TSH: 0.24 u[IU]/mL — ABNORMAL LOW (ref 0.35–4.50)

## 2017-03-29 NOTE — Assessment & Plan Note (Signed)
She saw Dr. Everardo All once and was not sure if she truly needs the medication --  she did not understand exactly what was going on We'll recheck TFTs and thyroid antibodies   if her thyroid function is still overactive we'll refer back to endocrine -she would prefer to see a different endocrinlogist

## 2017-03-29 NOTE — Assessment & Plan Note (Signed)
Continue statin - crestor 40 mg daily

## 2017-03-29 NOTE — Assessment & Plan Note (Signed)
Check a1c Low sugar / carb diet Stressed regular exercise, keeping weight down  

## 2017-03-29 NOTE — Assessment & Plan Note (Signed)
Following with cardiology No chest pain, frequent SOB Encouraged her to increase her exercise Continue current medication

## 2017-03-29 NOTE — Assessment & Plan Note (Signed)
BP Readings from Last 3 Encounters:  03/29/17 140/88  03/19/17 (!) 150/98  02/20/17 130/88   BP well controlled Current regimen effective and tolerated --  She does have a dry cough that is not too bothersome but she is ok with continuing Continue current medications at current doses cmp

## 2017-03-29 NOTE — Assessment & Plan Note (Signed)
In sinus, rate controlled Following with cardiology  Continue current medications

## 2017-03-30 LAB — THYROID ANTIBODIES
THYROID PEROXIDASE ANTIBODY: 1 [IU]/mL (ref <9)
Thyroglobulin Ab: <1 IU/mL (ref <2)

## 2017-04-01 ENCOUNTER — Other Ambulatory Visit: Payer: Self-pay | Admitting: Internal Medicine

## 2017-04-01 ENCOUNTER — Encounter: Payer: Self-pay | Admitting: Internal Medicine

## 2017-04-01 DIAGNOSIS — E119 Type 2 diabetes mellitus without complications: Secondary | ICD-10-CM | POA: Insufficient documentation

## 2017-04-01 DIAGNOSIS — E0590 Thyrotoxicosis, unspecified without thyrotoxic crisis or storm: Secondary | ICD-10-CM

## 2017-04-01 DIAGNOSIS — E1169 Type 2 diabetes mellitus with other specified complication: Secondary | ICD-10-CM | POA: Insufficient documentation

## 2017-04-02 ENCOUNTER — Other Ambulatory Visit: Payer: Self-pay | Admitting: Emergency Medicine

## 2017-04-02 DIAGNOSIS — E0590 Thyrotoxicosis, unspecified without thyrotoxic crisis or storm: Secondary | ICD-10-CM

## 2017-04-10 ENCOUNTER — Ambulatory Visit: Payer: Medicare Other | Admitting: Endocrinology

## 2017-05-14 NOTE — Progress Notes (Signed)
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  Admitted 11/17 with NSTEMI (troponin peak of 4.10) and had subsequent CABG. Initial cath showed a widely patent LCx stent but there was evidence of focal thrombus at the distal LM and ostial LAD. She was continued on Aggrastat and a relook cath was performed on 11/17 which showed 40-50% distal LMCA haziness and 70-80% ostial LAD stenosis. Echocardiogram November 2017 showed ejection fraction 55-60%, grade 2 diastolic dysfunction, mild left atrial enlargement, mild mitral regurgitation and moderate tricuspid regurgitation. Preop carotid Dopplers November 2017 showed 1-39% right and 40-59% left stenosis. Went for CABG on 07/17/2016 with LIMA-LAD, SVG-OM, and SVG-dRCA). Also with h/o PAF.   Since last seen, the patient has dyspnea with more extreme activities but not with routine activities. It is relieved with rest. It is not associated with chest pain. There is no orthopnea, PND or pedal edema. There is no syncope or palpitations. There is no exertional chest pain.   Current Outpatient Prescriptions  Medication Sig Dispense Refill  . acetaminophen (TYLENOL) 325 MG tablet Take 2 tablets (650 mg total) by mouth every 6 (six) hours as needed for mild pain. 30 tablet 0  . apixaban (ELIQUIS) 5 MG TABS tablet Take 1 tablet (5 mg total) by mouth 2 (two) times daily. 180 tablet 2  . aspirin EC 81 MG tablet Take 1 tablet (81 mg total) by mouth daily.    [name] ENSURE (ENSURE) Take 237 mLs by mouth daily.    [name] lisinopril (PRINIVIL,ZESTRIL) 5 MG tablet Take 1 tablet (5 mg total) by mouth daily. 90 tablet 2  . metoprolol tartrate (LOPRESSOR) 25 MG tablet Take 0.5 tablets (12.5 mg total) by mouth 2 (two) times daily. 90 tablet 2  . rosuvastatin (CRESTOR) 40 MG tablet Take 0.5 tablets (20 mg total) by mouth daily. 90 tablet 3  . nitroGLYCERIN (NITROSTAT) 0.4 MG SL  tablet Place 1 tablet (0.4 mg total) under the tongue every 5 (five) minutes as needed for chest pain. (Patient not taking: Reported on 02/20/2017) 90 tablet 3   No current facility-administered medications for this visit.      Past Medical History:  Diagnosis Date  . Abnormal LFTs    a. in the past when on Lipitor  . Anemia    as a young woman  . Anxiety   . Arnold-Chiari malformation, type I (HCC)    MRI brain 02/2010  . CAD (coronary artery disease)    a. NSTEMI 07/2009: LHC - D2 40%, LAD irreg., EF 50% with apical AK (tako-tsubo CM);  b. Inf STEMI (04/2013): Tx Promus DES to CFX;  c. 08/2013 Lexi CL: No ischemia, dist ant/ap/inf-ap infarct, EF  d. 06/2016 NSTEMI with LM disease: s/p CABG on 07/17/2016 w/ LIMA-LAD, SVG-OM, and SVG-dRCA.  [name] DEPRESSION   . Dizziness    ? CVA 01/2010 - carotid dopplers with no ICA stenosis  . GERD (gastroesophageal reflux disease)   . HYPERLIPIDEMIA   . HYPERTENSION   . MI (myocardial infarction) (HCC)   . PAD (peripheral artery disease) (HCC)    s/p L fem pop 11/2013 in setting of 1st toe osteo/gangrene  . Paroxysmal atrial fibrillation (HCC)    a. anticoagulated with Eliquis 10/2014  . RA (rheumatoid arthritis) (HCC)    prior tx by Dr. Dareen Piano  . Sjogren's syndrome (HCC) 06/02/13   pt denies this (12/01/13)  . SLE (systemic  lupus erythematosus) (HCC) dx 05/2013   follows with rheum Dareen Piano)  . Tachy-brady syndrome (HCC)    a. s/p STJ Leadless pacemaker 11-04-2014 Dr Johney Frame  . 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 ABDOMINAL AORTAGRAM N/A 11/24/2013   Procedure: ABDOMINAL AORTAGRAM WITH BILATERAL RUNOFF WITH POSSIBLE INTERVENTION;  Surgeon: Chuck Hint, MD;  Location: [facility] CATH LAB;  Service: Cardiovascular;  Laterality: N/A;  . AMPUTATION Left 12/02/2013   Procedure: LEFT 1ST TOE AMPUTATION;  Surgeon: Sherren Kerns,  MD;  Location: [facility] OR;  Service: Vascular;  Laterality: Left;  . CARDIAC CATHETERIZATION N/A 07/12/2016   Procedure: Left Heart Cath and Coronary Angiography;  Surgeon: Lennette Bihari, MD;  Location: [facility] INVASIVE CV LAB;  Service: Cardiovascular;  Laterality: N/A;  . CARDIAC CATHETERIZATION N/A 07/14/2016   Procedure: Intravascular Ultrasound/IVUS;  Surgeon: Yvonne Kendall, MD;  Location: [facility] INVASIVE CV LAB;  Service: Cardiovascular;  Laterality: N/A;  . COLONOSCOPY    . CORONARY ANGIOPLASTY  04/2013  . CORONARY ARTERY BYPASS GRAFT N/A 07/17/2016   Procedure: CORONARY ARTERY BYPASS GRAFTING (CABG)x3 using right greater saphenous vein harvested endoscopiclly and left internal mammary artery;  Surgeon: Kerin Perna, MD;  Location: [facility] OR;  Service: Open Heart Surgery;  Laterality: N/A;  . FEMORAL-POPLITEAL BYPASS GRAFT Left 12/02/2013   Procedure:  LEFT FEMORAL-BELOW KNEE POPLITEAL ARTERY BYPASS GRAFT;  Surgeon: Sherren Kerns, MD;  Location: [facility] OR;  Service: Vascular;  Laterality: Left;  . LEFT HEART CATHETERIZATION WITH CORONARY ANGIOGRAM N/A 05/19/2013   Procedure: LEFT HEART CATHETERIZATION WITH CORONARY ANGIOGRAM;  Surgeon: Micheline Chapman, MD;  Location: [facility] CATH LAB;  Service: Cardiovascular;  Laterality: N/A;  . LEFT HEART CATHETERIZATION WITH CORONARY ANGIOGRAM N/A 10/28/2014   Procedure: LEFT HEART CATHETERIZATION WITH CORONARY ANGIOGRAM;  Surgeon: Iran Ouch, MD;  Location: [facility] CATH LAB;  Service: Cardiovascular;  Laterality: N/A;  . PERCUTANEOUS CORONARY STENT INTERVENTION (PCI-S)  05/19/2013   Procedure: PERCUTANEOUS CORONARY STENT INTERVENTION (PCI-S);  Surgeon: Micheline Chapman, MD;  Location: [facility] CATH LAB;  Service: Cardiovascular;;  . PERMANENT PACEMAKER INSERTION N/A 11/04/2014   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  . TEE WITHOUT CARDIOVERSION N/A 07/17/2016   Procedure: TRANSESOPHAGEAL ECHOCARDIOGRAM (TEE);  Surgeon: Kerin Perna, MD;  Location: [facility] OR;   Service: Open Heart Surgery;  Laterality: N/A;  . TUBAL LIGATION      Social History   Social History  . Marital status: Single    Spouse name: N/A  . Number of children: 3  . Years of education: N/A   Occupational History  . Not on file.   Social History Main Topics  . Smoking status: Former Smoker    Types: Cigarettes    Quit date: 05/19/2016  . Smokeless tobacco: Never Used     Comment:    . Alcohol use 0.0 oz/week  . Drug use: No  . Sexual activity: Not on file   Other Topics Concern  . Not on file   Social History Narrative   Lives alone.    Family History  Problem Relation Age of Onset  . Arthritis Mother   . Emphysema Mother   . Arthritis Father   . COPD Father   . Diabetes Other   . Thyroid disease Neg Hx     ROS: no fevers or chills, productive cough, hemoptysis, dysphasia,  odynophagia, melena, hematochezia, dysuria, hematuria, rash, seizure activity, orthopnea, PND, pedal edema, claudication. Remaining systems are negative.  Physical Exam: Well-developed well-nourished in no acute distress.  Skin is warm and dry.  HEENT is normal.  Neck is supple.  Chest is clear to auscultation with normal expansion.  Cardiovascular exam is regular rate and rhythm.  Abdominal exam nontender or distended. No masses palpated. Extremities show no edema. neuro grossly intact   A/P  1 Coronary artery disease-status post coronary artery bypass graft. Continue aspirin and statin.  2 carotid artery disease-continue aspirin and statin. Schedule follow-up carotid Dopplers November 2018.  3 hypertension-blood pressure is controlled. Continue present medications.  4 Hyperlipidemia-continue statin.  5 tobacco abuse-patient counseled on discontinuing.  6 paroxysmal atrial fibrillation-continue the blocker for rate control if atrial fibrillation recurs. Continued eliquis.  7 prior pacemaker-followed by electrophysiology.  8 peripheral vascular disease-followed by  vascular surgery.   Olga Millers, MD

## 2017-05-28 ENCOUNTER — Encounter: Payer: Self-pay | Admitting: Cardiology

## 2017-05-28 ENCOUNTER — Ambulatory Visit (INDEPENDENT_AMBULATORY_CARE_PROVIDER_SITE_OTHER): Payer: Medicare Other | Admitting: Cardiology

## 2017-05-28 VITALS — BP 132/76 | HR 78 | Ht 65.5 in | Wt 122.0 lb

## 2017-05-28 DIAGNOSIS — E7800 Pure hypercholesterolemia, unspecified: Secondary | ICD-10-CM

## 2017-05-28 DIAGNOSIS — I2510 Atherosclerotic heart disease of native coronary artery without angina pectoris: Secondary | ICD-10-CM

## 2017-05-28 DIAGNOSIS — I679 Cerebrovascular disease, unspecified: Secondary | ICD-10-CM | POA: Diagnosis not present

## 2017-05-28 DIAGNOSIS — I10 Essential (primary) hypertension: Secondary | ICD-10-CM | POA: Diagnosis not present

## 2017-05-28 NOTE — Patient Instructions (Signed)
Medication Instructions:   NO CHANGE  Testing/Procedures:  Your physician has requested that you have a carotid duplex. This test is an ultrasound of the carotid arteries in your neck. It looks at blood flow through these arteries that supply the brain with blood. Allow one hour for this exam. There are no restrictions or special instructions.DUE IN November     Follow-Up:  Your physician wants you to follow-up in: 6 MONTHS WITH DR Jens Som You will receive a reminder letter in the mail two months in advance. If you don't receive a letter, please call our office to schedule the follow-up appointment.   If you need a refill on your cardiac medications before your next appointment, please call your pharmacy.

## 2017-06-11 ENCOUNTER — Other Ambulatory Visit: Payer: Self-pay | Admitting: Student

## 2017-06-20 ENCOUNTER — Encounter (HOSPITAL_COMMUNITY): Payer: Self-pay | Admitting: *Deleted

## 2017-06-20 ENCOUNTER — Emergency Department (HOSPITAL_COMMUNITY)
Admission: EM | Admit: 2017-06-20 | Discharge: 2017-06-20 | Disposition: A | Discharge status: Home / Self Care | Payer: Medicare Other | Attending: Emergency Medicine | Admitting: Emergency Medicine

## 2017-06-20 DIAGNOSIS — M79641 Pain in right hand: Secondary | ICD-10-CM | POA: Insufficient documentation

## 2017-06-20 NOTE — ED Notes (Signed)
Pt states she has appointment with her dr tom at 10, pt left armband removed

## 2017-06-20 NOTE — ED Triage Notes (Signed)
Pt reports onset last night of severe right hand pain with swelling. Feels like she has decreased rom of right hand. Has some redness and swelling noted. Denies any injury.

## 2017-06-20 NOTE — ED Notes (Signed)
Pt states she wants to leave talked into staying for a while

## 2017-07-23 ENCOUNTER — Other Ambulatory Visit: Payer: Self-pay | Admitting: Student

## 2017-07-23 DIAGNOSIS — I10 Essential (primary) hypertension: Secondary | ICD-10-CM

## 2017-07-24 ENCOUNTER — Ambulatory Visit (HOSPITAL_COMMUNITY)
Admission: RE | Admit: 2017-07-24 | Discharge: 2017-07-24 | Disposition: A | Discharge status: Home / Self Care | Payer: Medicare Other | Source: Ambulatory Visit | Attending: Cardiology | Admitting: Cardiology

## 2017-07-24 DIAGNOSIS — I2510 Atherosclerotic heart disease of native coronary artery without angina pectoris: Secondary | ICD-10-CM | POA: Insufficient documentation

## 2017-07-24 DIAGNOSIS — Z87891 Personal history of nicotine dependence: Secondary | ICD-10-CM | POA: Diagnosis not present

## 2017-07-24 DIAGNOSIS — I252 Old myocardial infarction: Secondary | ICD-10-CM | POA: Diagnosis not present

## 2017-07-24 DIAGNOSIS — I6523 Occlusion and stenosis of bilateral carotid arteries: Secondary | ICD-10-CM | POA: Insufficient documentation

## 2017-07-24 DIAGNOSIS — E119 Type 2 diabetes mellitus without complications: Secondary | ICD-10-CM | POA: Insufficient documentation

## 2017-07-24 DIAGNOSIS — I10 Essential (primary) hypertension: Secondary | ICD-10-CM | POA: Diagnosis not present

## 2017-07-24 DIAGNOSIS — E785 Hyperlipidemia, unspecified: Secondary | ICD-10-CM | POA: Insufficient documentation

## 2017-07-24 DIAGNOSIS — I739 Peripheral vascular disease, unspecified: Secondary | ICD-10-CM | POA: Insufficient documentation

## 2017-07-24 DIAGNOSIS — I679 Cerebrovascular disease, unspecified: Secondary | ICD-10-CM | POA: Diagnosis not present

## 2017-07-24 NOTE — Telephone Encounter (Signed)
Rx(s) sent to pharmacy electronically.  

## 2017-07-29 ENCOUNTER — Other Ambulatory Visit: Payer: Self-pay | Admitting: Student

## 2017-09-10 ENCOUNTER — Other Ambulatory Visit (HOSPITAL_COMMUNITY): Payer: Medicare Other

## 2017-09-10 ENCOUNTER — Ambulatory Visit: Payer: Medicare Other | Admitting: Family

## 2017-09-10 ENCOUNTER — Encounter (HOSPITAL_COMMUNITY): Payer: Medicare Other

## 2017-10-03 ENCOUNTER — Ambulatory Visit: Payer: Medicare Other | Admitting: Internal Medicine

## 2017-10-26 ENCOUNTER — Other Ambulatory Visit: Payer: Self-pay | Admitting: Student

## 2017-10-31 ENCOUNTER — Ambulatory Visit: Payer: Medicare Other | Admitting: Internal Medicine

## 2017-11-06 ENCOUNTER — Encounter: Payer: Self-pay | Admitting: Family

## 2017-11-06 ENCOUNTER — Ambulatory Visit (INDEPENDENT_AMBULATORY_CARE_PROVIDER_SITE_OTHER): Payer: Medicare Other | Admitting: Family

## 2017-11-06 ENCOUNTER — Ambulatory Visit (HOSPITAL_COMMUNITY)
Admission: RE | Admit: 2017-11-06 | Discharge: 2017-11-06 | Disposition: A | Discharge status: Home / Self Care | Payer: Medicare Other | Source: Ambulatory Visit | Attending: Family | Admitting: Family

## 2017-11-06 VITALS — BP 114/71 | HR 66 | Temp 97.3°F | Resp 18 | Ht 65.5 in | Wt 128.0 lb

## 2017-11-06 DIAGNOSIS — I779 Disorder of arteries and arterioles, unspecified: Secondary | ICD-10-CM

## 2017-11-06 DIAGNOSIS — Z87891 Personal history of nicotine dependence: Secondary | ICD-10-CM

## 2017-11-06 DIAGNOSIS — Z7722 Contact with and (suspected) exposure to environmental tobacco smoke (acute) (chronic): Secondary | ICD-10-CM

## 2017-11-06 DIAGNOSIS — Z95828 Presence of other vascular implants and grafts: Secondary | ICD-10-CM

## 2017-11-06 NOTE — Patient Instructions (Addendum)
Secondhand Smoke What is secondhand smoke? Secondhand smoke is smoke that comes from burning tobacco. It could be the smoke from a cigarette, a pipe, or a cigar. Even if you are not the one smoking, secondhand smoke exposes you to the dangers of smoking. This is called involuntary, or passive, smoking. There are two types of secondhand smoke:  Sidestream smoke is the smoke that comes off the lighted end of a cigarette, pipe, or cigar. ? This type of smoke has the highest amount of cancer-causing agents (carcinogens). ? The particles in sidestream smoke are smaller. They get into your lungs more easily.  Mainstream smoke is the smoke that is exhaled by a person who is smoking. ? This type of smoke is also dangerous to your health.  How can secondhand smoke affect my health? Studies show that there is no safe level of secondhand smoke. This smoke contains thousands of chemicals. At least 69 of them are known to cause cancer. Secondhand smoke can also cause many other health problems. It has been linked to:  Lung cancer.  Cancer of the voice box (larynx) or throat.  Cancer of the sinuses.  Brain cancer.  Bladder cancer.  Stomach cancer.  Breast cancer.  White blood cell cancers (lymphoma and leukemia).  Brain and liver tumors in children.  Heart disease and stroke in adults.  Pregnancy loss (miscarriage).  Diseases in children, such as: ? Asthma. ? Lung infections. ? Ear infections. ? Sudden infant death syndrome (SIDS). ? Slow growth.  Where can I be at risk for exposure to secondhand smoke?  For adults, the workplace is the main source of exposure to secondhand smoke. ? Your workplace should have a policy separating smoking areas from nonsmoking areas. ? Smoking areas should have a system for ventilating and cleaning the air.  For children, the home may be the most dangerous place for exposure to secondhand smoke. ? Children who live in apartment buildings may be at  risk from smoke drifting from hallways or other people's homes.  For everyone, many public places are possible sources of exposure to secondhand smoke. ? These places include restaurants, shopping centers, and parks. How can I reduce my risk for exposure to secondhand smoke? The most important thing you can do is not smoke. Discourage family members from smoking. Other ways to reduce exposure for you and your family include the following:  Keep your home smoke free.  Make sure your child care providers do not smoke.  Warn your child about the dangers of smoking and secondhand smoke.  Do not allow smoking in your car. When someone smokes in a car, all the damaging chemicals from the smoke are confined in a small area.  Avoid public places where smoking is allowed.  This information is not intended to replace advice given to you by your health care provider. Make sure you discuss any questions you have with your health care provider. Document Released: 09/21/2004 Document Revised: 07/11/2016 Document Reviewed: 11/28/2013 Elsevier Interactive Patient Education  2017 Elsevier Inc.    Peripheral Vascular Disease Peripheral vascular disease (PVD) is a disease of the blood vessels that are not part of your heart and brain. A simple term for PVD is poor circulation. In most cases, PVD narrows the blood vessels that carry blood from your heart to the rest of your body. This can result in a decreased supply of blood to your arms, legs, and internal organs, like your stomach or kidneys. However, it most often affects a   person's lower legs and feet. There are two types of PVD.  Organic PVD. This is the more common type. It is caused by damage to the structure of blood vessels.  Functional PVD. This is caused by conditions that make blood vessels contract and tighten (spasm).  Without treatment, PVD tends to get worse over time. PVD can also lead to acute ischemic limb. This is when an arm or  limb suddenly has trouble getting enough blood. This is a medical emergency. Follow these instructions at home:  Take medicines only as told by your doctor.  Do not use any tobacco products, including cigarettes, chewing tobacco, or electronic cigarettes. If you need help quitting, ask your doctor.  Lose weight if you are overweight, and maintain a healthy weight as told by your doctor.  Eat a diet that is low in fat and cholesterol. If you need help, ask your doctor.  Exercise regularly. Ask your doctor for some good activities for you.  Take good care of your feet. ? Wear comfortable shoes that fit well. ? Check your feet often for any cuts or sores. Contact a doctor if:  You have cramps in your legs while walking.  You have leg pain when you are at rest.  You have coldness in a leg or foot.  Your skin changes.  You are unable to get or have an erection (erectile dysfunction).  You have cuts or sores on your feet that are not healing. Get help right away if:  Your arm or leg turns cold and blue.  Your arms or legs become red, warm, swollen, painful, or numb.  You have chest pain or trouble breathing.  You suddenly have weakness in your face, arm, or leg.  You become very confused or you cannot speak.  You suddenly have a very bad headache.  You suddenly cannot see. This information is not intended to replace advice given to you by your health care provider. Make sure you discuss any questions you have with your health care provider. Document Released: 11/08/2009 Document Revised: 01/20/2016 Document Reviewed: 01/22/2014 Elsevier Interactive Patient Education  2017 Elsevier Inc.  

## 2017-11-06 NOTE — Progress Notes (Signed)
[facility]   CC: Follow up peripheral artery occlusive disease  History of Present Illness Mercedes [name] is a 62 y.o. female who is s/p left femoral to below-knee popliteal bypass and amputation of her left first toe on December 02, 2013 by Dr. Darrick Penna. She had one vessel peroneal runoff. This was a non-reversed vein graft.    The rest pain in her left foot has resolved.  She no longer has claudication symptoms with walking.   Pt denies any known history of stroke or TIA, does report 2 MI's.  Pacemaker inserted March 2016 for tachy/brady, states this doing well.  She had a CABG in November 2017, then rehab in a NH.  She attended cardiac rehab.  Diabetic: No Tobacco use: former smoker, quit in 2015, however, she is exposed to secondhand smoke in her home  Pt meds include: Statin : yes ASA: yes Other anticoagulants/antiplatelets: Eliquis, has a hx of PAF    Past Medical History:  Diagnosis Date  . Abnormal LFTs    a. in the past when on Lipitor  . Anemia    as a young woman  . Anxiety   . Arnold-Chiari malformation, type I (HCC)    MRI brain 02/2010  . CAD (coronary artery disease)    a. NSTEMI 07/2009: LHC - D2 40%, LAD irreg., EF 50% with apical AK (tako-tsubo CM);  b. Inf STEMI (04/2013): Tx Promus DES to CFX;  c. 08/2013 Lexi CL: No ischemia, dist ant/ap/inf-ap infarct, EF  d. 06/2016 NSTEMI with LM disease: s/p CABG on 07/17/2016 w/ LIMA-LAD, SVG-OM, and SVG-dRCA.  [name] DEPRESSION   . Dizziness    ? CVA 01/2010 - carotid dopplers with no ICA stenosis  . GERD (gastroesophageal reflux disease)   . HYPERLIPIDEMIA   . HYPERTENSION   . MI (myocardial infarction) (HCC)   . PAD (peripheral artery disease) (HCC)    s/p L fem pop 11/2013 in setting of 1st toe osteo/gangrene  . Paroxysmal atrial fibrillation (HCC)    a. anticoagulated with Eliquis 10/2014  . RA (rheumatoid arthritis) (HCC)    prior tx by Dr. Dareen Piano  . Sjogren's syndrome (HCC)  06/02/13   pt denies this (12/01/13)  . SLE (systemic lupus erythematosus) (HCC) dx 05/2013   follows with rheum Dareen Piano)  . Tachy-brady syndrome (HCC)    a. s/p STJ Leadless pacemaker 11-04-2014 Dr Johney Frame  . 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 Smoking status: Former Smoker    Types: Cigarettes    Last attempt to quit: 05/19/2016    Years since quitting: 1.4  . Smokeless tobacco: Never Used  . Tobacco comment:    Substance Use Topics  . Alcohol use: No    Alcohol/week: 0.0 oz    Frequency: Never  . Drug use: No    Family History Family History  Problem Relation Age of Onset  . Arthritis Mother   . Emphysema Mother   . Arthritis Father   . COPD Father   . Diabetes Other   . Thyroid disease Neg Hx     Past Surgical History:  Procedure Laterality Date  . ABDOMINAL AORTAGRAM N/A 11/24/2013   Procedure: ABDOMINAL AORTAGRAM WITH BILATERAL RUNOFF WITH POSSIBLE INTERVENTION;  Surgeon: Chuck Hint, MD;  Location: [facility] CATH LAB;  Service: Cardiovascular;  Laterality: N/A;  . AMPUTATION Left 12/02/2013  Procedure: LEFT 1ST TOE AMPUTATION;  Surgeon: Sherren Kerns, MD;  Location: [facility] OR;  Service: Vascular;  Laterality: Left;  . CARDIAC CATHETERIZATION N/A 07/12/2016   Procedure: Left Heart Cath and Coronary Angiography;  Surgeon: Lennette Bihari, MD;  Location: [facility] INVASIVE CV LAB;  Service: Cardiovascular;  Laterality: N/A;  . CARDIAC CATHETERIZATION N/A 07/14/2016   Procedure: Intravascular Ultrasound/IVUS;  Surgeon: Yvonne Kendall, MD;  Location: [facility] INVASIVE CV LAB;  Service: Cardiovascular;  Laterality: N/A;  . COLONOSCOPY    . CORONARY ANGIOPLASTY  04/2013  . CORONARY ARTERY BYPASS GRAFT N/A 07/17/2016   Procedure: CORONARY ARTERY BYPASS GRAFTING (CABG)x3 using right greater saphenous vein harvested endoscopiclly and left internal mammary artery;   Surgeon: Kerin Perna, MD;  Location: [facility] OR;  Service: Open Heart Surgery;  Laterality: N/A;  . FEMORAL-POPLITEAL BYPASS GRAFT Left 12/02/2013   Procedure:  LEFT FEMORAL-BELOW KNEE POPLITEAL ARTERY BYPASS GRAFT;  Surgeon: Sherren Kerns, MD;  Location: [facility] OR;  Service: Vascular;  Laterality: Left;  . LEFT HEART CATHETERIZATION WITH CORONARY ANGIOGRAM N/A 05/19/2013   Procedure: LEFT HEART CATHETERIZATION WITH CORONARY ANGIOGRAM;  Surgeon: Micheline Chapman, MD;  Location: [facility] CATH LAB;  Service: Cardiovascular;  Laterality: N/A;  . LEFT HEART CATHETERIZATION WITH CORONARY ANGIOGRAM N/A 10/28/2014   Procedure: LEFT HEART CATHETERIZATION WITH CORONARY ANGIOGRAM;  Surgeon: Iran Ouch, MD;  Location: [facility] CATH LAB;  Service: Cardiovascular;  Laterality: N/A;  . PERCUTANEOUS CORONARY STENT INTERVENTION (PCI-S)  05/19/2013   Procedure: PERCUTANEOUS CORONARY STENT INTERVENTION (PCI-S);  Surgeon: Micheline Chapman, MD;  Location: [name] CATH LAB;  Service: Cardiovascular;;  . PERMANENT PACEMAKER INSERTION N/A 11/04/2014   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  . TEE WITHOUT CARDIOVERSION N/A 07/17/2016   Procedure: TRANSESOPHAGEAL ECHOCARDIOGRAM (TEE);  Surgeon: Kerin Perna, MD;  Location: [name] OR;  Service: Open Heart Surgery;  Laterality: N/A;  . TUBAL LIGATION      Allergies  Allergen Reactions  . Brilinta [Ticagrelor] Anaphylaxis    Also chest tightness    Current Outpatient Medications  Medication Sig Dispense Refill  . acetaminophen (TYLENOL) 325 MG tablet Take 2 tablets (650 mg total) by mouth every 6 (six) hours as needed for mild pain. 30 tablet 0  . aspirin EC 81 MG tablet Take 1 tablet (81 mg total) by mouth daily.    [name] ELIQUIS 5 MG TABS tablet TAKE 1 TABLET BY MOUTH TWICE DAILY 180 tablet 1  . ENSURE (ENSURE) Take 237 mLs by mouth daily.    [name] lisinopril (PRINIVIL,ZESTRIL) 5 MG tablet TAKE 1 TABLET BY MOUTH DAILY 90 tablet 0  . metoprolol tartrate (LOPRESSOR) 25 MG  tablet TAKE 1/2 TABLET BY MOUTH TWICE DAILY 90 tablet 1  . rosuvastatin (CRESTOR) 40 MG tablet Take 0.5 tablets (20 mg total) by mouth daily. 90 tablet 3  . nitroGLYCERIN (NITROSTAT) 0.4 MG SL tablet Place 1 tablet (0.4 mg total) under the tongue every 5 (five) minutes as needed for chest pain. (Patient not taking: Reported on 02/20/2017) 90 tablet 3   No current facility-administered medications for this visit.     ROS: See HPI for pertinent positives and negatives.   Physical Examination  Vitals:   11/06/17 1535  BP: 114/71  Pulse: 66  Resp: 18  Temp: (!) 97.3 F (36.3 C)  TempSrc: Oral  SpO2: 97%  Weight: 128 lb (58.1 kg)  Height: 5' 5.5" (1.664 m)   Body mass index is 20.98 kg/m.  General: A&O  x 3, thin female, WD Gait: normal HENT: No gross abnormalities  Eyes: PERRLA. Pulmonary: CTAB, respirations are non labored Cardiac: regular rythm, no detected murmur.     Carotid Bruits Right Left   Negative Negative   Abdominal aortic pulse is not palpable. Radial pulses: are 2+ palpableand =   VASCULAR EXAM: Extremitieswithout ischemic changes  without Gangrene; without open wounds. Left great toe is surgically absent. Tips of right toes are slightly cool, not discolored.      LE Pulses Right Left   FEMORAL  palpable  palpable    POPLITEAL not palpable  not palpable   POSTERIOR TIBIAL not palpable  not palpable    DORSALIS PEDIS  ANTERIOR TIBIAL not palpable  faintly palpable    Abdomen: soft, NT, no palpable masses. Skin: no rashes, no ulcers. Musculoskeletal: no muscle wasting or atrophy.  Neurologic: A&O X 3; appropriate affect, Sensation is normal; MOTOR FUNCTION:  moving all extremities equally, motor strength 5/5  throughout. Speech is fluent/normal. CN 2-12 intact. Psychiatric: Thought content is normal, mood appropriate for clinical situation.     ASSESSMENT: MAKAYLAH ODDO is a 62 y.o. female who is s/p left femoral to below-knee popliteal bypass and amputation of her left first toe on December 02 2013. She had one vessel peroneal runoff. This was a non-reversed vein graft.   The rest pain in her left foot has resolved.  She no longer has claudication sx's in her legs with walking; the tips of her right toes are slightly cool.  She feels well, admits to not walking enough.   Pt's atherosclerotic risk factors include former smoker, exposure to secondhand smoke in her home, and CAD.  She takes a statin, ASA, and Eliquis.  DATA  Left LE Arterial Duplex (11/06/17): Patent left LE bypass graft with no evidence of vessel narrowing or restenosis, triphasic waveforms at the inflow, biphasic throughout the bypass, monophasic at the outflow.  No significant change in comparison to the exams on 12-30-15 and 09-06-16.   ABI (Date: 11/06/2017):  R:   ABI: 0.53 (was 0.62 on 09-06-16),   PT: mono  DP: mono  TBI:  0.38, toe pressure 55 (was 0.00)  L:   ABI: 0.90 (was 0.89),   PT: mono  DP: mono  TBI: s/p left great amputation  Slight decline on the right with moderate disease, stable on the left with mild disease, all monophasic waveforms.   PLAN:  I advised her to walk at least 30 minutes/day.    Based on the patient's vascular studies and examination, pt will return to clinic in 1 yearwith ABI's and left LE arterial duplex. I advised her to notify us if she develops concerns re the circulation in her feet/legs.   I discussed in depth with the patient the nature of atherosclerosis, and emphasized the importance of maximal medical management including strict control of blood pressure, blood glucose, and lipid levels, obtaining regular exercise, and cessation of exposure to secondhand  smoking.  The patient is aware that without maximal medical management the underlying atherosclerotic disease process will progress, limiting the benefit of any interventions.  The patient was given information about PAD including signs, symptoms, treatment, what symptoms should prompt the patient to seek immediate medical care, and risk reduction measures to take.  Charisse March, RN, MSN, FNP-C [facility] Phone: [phone]  Clinic MD: Early  11/06/17 4:17 PM

## 2017-11-12 NOTE — Progress Notes (Signed)
Electrophysiology Office Note Date: 11/14/2017  ID:  Mercedes [name], DOB 04-04-1956, MRN [id number]  PCP: Pincus Sanes, MD  Primary Cardiologist: Jens Som Electrophysiologist: Allred   CC: Pacemaker follow-up  Mercedes [name] is a 62 y.o. female is seen today for Dr Johney Frame.  She presents today for routine EP follow up.  Since last being seen in our clinic, the patient reports doing reasonably well.  She denies chest pain, palpitations, dyspnea, PND, orthopnea, nausea, vomiting, dizziness, syncope.  She is largely unaware of her AF but does have occasional tachy-palpitations. She has quit smoking. She has completed cardiac rehab and is looking into Silver Sneakers  Device History: STJ leadless PPM implanted 10/2014 for tachy/brady syndrome   Past Medical History:  Diagnosis Date  . Abnormal LFTs    a. in the past when on Lipitor  . Anemia    as a young woman  . Anxiety   . Arnold-Chiari malformation, type I (HCC)    MRI brain 02/2010  . CAD (coronary artery disease)    a. NSTEMI 07/2009: LHC - D2 40%, LAD irreg., EF 50% with apical AK (tako-tsubo CM);  b. Inf STEMI (04/2013): Tx Promus DES to CFX;  c. 08/2013 Lexi CL: No ischemia, dist ant/ap/inf-ap infarct, EF  d. 06/2016 NSTEMI with LM disease: s/p CABG on 07/17/2016 w/ LIMA-LAD, SVG-OM, and SVG-dRCA.  [name] DEPRESSION   . Dizziness    ? CVA 01/2010 - carotid dopplers with no ICA stenosis  . GERD (gastroesophageal reflux disease)   . HYPERLIPIDEMIA   . HYPERTENSION   . MI (myocardial infarction) (HCC)   . PAD (peripheral artery disease) (HCC)    s/p L fem pop 11/2013 in setting of 1st toe osteo/gangrene  . Paroxysmal atrial fibrillation (HCC)    a. anticoagulated with Eliquis 10/2014  . RA (rheumatoid arthritis) (HCC)    prior tx by Dr. Dareen Piano  . Sjogren's syndrome (HCC) 06/02/13   pt denies this (12/01/13)  . SLE (systemic lupus erythematosus) (HCC) dx 05/2013   follows with rheum Dareen Piano)  . Tachy-brady syndrome (HCC)     a. s/p STJ Leadless pacemaker 11-04-2014 Dr Johney Frame  . 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 ABDOMINAL AORTAGRAM N/A 11/24/2013   Procedure: ABDOMINAL AORTAGRAM WITH BILATERAL RUNOFF WITH POSSIBLE INTERVENTION;  Surgeon: Chuck Hint, MD;  Location: [facility] CATH LAB;  Service: Cardiovascular;  Laterality: N/A;  . AMPUTATION Left 12/02/2013   Procedure: LEFT 1ST TOE AMPUTATION;  Surgeon: Sherren Kerns, MD;  Location: [facility] OR;  Service: Vascular;  Laterality: Left;  . CARDIAC CATHETERIZATION N/A 07/12/2016   Procedure: Left Heart Cath and Coronary Angiography;  Surgeon: Lennette Bihari, MD;  Location: [facility] INVASIVE CV LAB;  Service: Cardiovascular;  Laterality: N/A;  . CARDIAC CATHETERIZATION N/A 07/14/2016   Procedure: Intravascular Ultrasound/IVUS;  Surgeon: Yvonne Kendall, MD;  Location: [facility] INVASIVE CV LAB;  Service: Cardiovascular;  Laterality: N/A;  . COLONOSCOPY    . CORONARY ANGIOPLASTY  04/2013  . CORONARY ARTERY BYPASS GRAFT N/A 07/17/2016   Procedure: CORONARY ARTERY BYPASS GRAFTING (CABG)x3 using right greater saphenous vein harvested endoscopiclly and left internal mammary artery;  Surgeon: Kerin Perna, MD;  Location: [facility] OR;  Service: Open Heart Surgery;  Laterality: N/A;  . FEMORAL-POPLITEAL BYPASS GRAFT Left 12/02/2013   Procedure:  LEFT FEMORAL-BELOW KNEE POPLITEAL ARTERY BYPASS GRAFT;  Surgeon: Leonette Most  Holland Commons, MD;  Location: [facility] OR;  Service: Vascular;  Laterality: Left;  . LEFT HEART CATHETERIZATION WITH CORONARY ANGIOGRAM N/A 05/19/2013   Procedure: LEFT HEART CATHETERIZATION WITH CORONARY ANGIOGRAM;  Surgeon: Micheline Chapman, MD;  Location: [facility] CATH LAB;  Service: Cardiovascular;  Laterality: N/A;  . LEFT HEART CATHETERIZATION WITH CORONARY ANGIOGRAM N/A 10/28/2014   Procedure: LEFT HEART CATHETERIZATION WITH CORONARY ANGIOGRAM;  Surgeon: Iran Ouch,  MD;  Location: [facility] CATH LAB;  Service: Cardiovascular;  Laterality: N/A;  . PERCUTANEOUS CORONARY STENT INTERVENTION (PCI-S)  05/19/2013   Procedure: PERCUTANEOUS CORONARY STENT INTERVENTION (PCI-S);  Surgeon: Micheline Chapman, MD;  Location[facility] CATH LAB;  Service: Cardiovascular;;  . PERMANENT PACEMAKER INSERTION N/A 11/04/2014   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  . TEE WITHOUT CARDIOVERSION N/A 07/17/2016   Procedure: TRANSESOPHAGEAL ECHOCARDIOGRAM (TEE);  Surgeon: Kerin Perna, MD;  Location: [facility] OR;  Service: Open Heart Surgery;  Laterality: N/A;  . TUBAL LIGATION      Current Outpatient Medications  Medication Sig Dispense Refill  . acetaminophen (TYLENOL) 325 MG tablet Take 2 tablets (650 mg total) by mouth every 6 (six) hours as needed for mild pain. 30 tablet 0  . aspirin EC 81 MG tablet Take 1 tablet (81 mg total) by mouth daily.    [name] ELIQUIS 5 MG TABS tablet TAKE 1 TABLET BY MOUTH TWICE DAILY 180 tablet 1  . ENSURE (ENSURE) Take 237 mLs by mouth daily.    [name] lisinopril (PRINIVIL,ZESTRIL) 5 MG tablet TAKE 1 TABLET BY MOUTH DAILY 90 tablet 0  . metoprolol tartrate (LOPRESSOR) 25 MG tablet TAKE 1/2 TABLET BY MOUTH TWICE DAILY 90 tablet 1  . nitroGLYCERIN (NITROSTAT) 0.4 MG SL tablet Place 0.4 mg under the tongue every 5 (five) minutes as needed for chest pain.    . rosuvastatin (CRESTOR) 40 MG tablet Take 0.5 tablets (20 mg total) by mouth daily. 90 tablet 3   No current facility-administered medications for this visit.     Allergies:   Brilinta [ticagrelor]   Social History: Social History   Socioeconomic History  . Marital status: Single    Spouse name: Not on file  . Number of children: 3  . Years of education: Not on file  . Highest education level: Not on file  Social Needs  . Financial resource strain: Not on file  . Food insecurity - worry: Not on file  . Food insecurity - inability: Not on file  . Transportation needs - medical: Not on  file  . Transportation needs - non-medical: Not on file  Occupational History  . Not on file  Tobacco Use  . Smoking status: Former Smoker    Types: Cigarettes    Last attempt to quit: 05/19/2016    Years since quitting: 1.4  . Smokeless tobacco: Never Used  . Tobacco comment:    Substance and Sexual Activity  . Alcohol use: No    Alcohol/week: 0.0 oz    Frequency: Never  . Drug use: No  . Sexual activity: Not on file  Other Topics Concern  . Not on file  Social History Narrative   Lives alone.    Family History: Family History  Problem Relation Age of Onset  . Arthritis Mother   . Emphysema Mother   . Arthritis Father   . COPD Father   . Diabetes Other   . Thyroid disease Neg Hx      Review of Systems: All other systems  reviewed and are otherwise negative except as noted above.   Physical Exam: VS:  BP 138/90   Pulse 86   Ht 5' 5.5" (1.664 m)   Wt 126 lb 12.8 oz (57.5 kg)   SpO2 97%   BMI 20.78 kg/m  , BMI Body mass index is 20.78 kg/m.  GEN- The patient is well and thin appearing, alert and oriented x 3 today.   HEENT: normocephalic, atraumatic; sclera clear, conjunctiva pink; hearing intact; oropharynx clear; neck supple Lungs- Clear to ausculation bilaterally, normal work of breathing.  No wheezes, rales, rhonchi Heart- Regular rate and rhythm, no murmurs, rubs or gallops GI- soft, non-tender, non-distended, bowel sounds present Extremities- no clubbing, cyanosis, or edema   MS- no significant deformity or atrophy Skin- warm and dry, no rash or lesion Psych- euthymic mood, full affect Neuro- strength and sensation are intact  PPM Interrogation- reviewed in detail today,  See PACEART report  EKG:  EKG is not ordered today.  Recent Labs: 02/01/2017: Hemoglobin 13.4; Platelets 346 03/29/2017: ALT 15; BUN 13; Creatinine, Ser 0.99; Potassium 4.4; Sodium 136; TSH 0.24   Wt Readings from Last 3 Encounters:  11/14/17 126 lb 12.8 oz (57.5 kg)  11/06/17 128  lb (58.1 kg)  05/28/17 122 lb (55.3 kg)     Other studies Reviewed: Additional studies/ records that were reviewed today include:  Dr Jenel Lucks office note  Assessment and Plan:  1.  Tachy/brady syndrome Leadless pacemaker remains at RRT but with normal device function.  Histograms and % pacing are not accurate Normal device function otherwise Stable No change required today  2.  Paroxysmal atrial fibrillation Continue Eliquis for CHADS2VASC score of at least 4 If AF burden increases, consider PVI I have advised she can take an extra Metoprolol when needed  3.  CAD No recent ischemic symptoms Continue current therapy   4.  Tobacco abuse She has quit smoking   5.  HTN Stable No change required today  Current medicines are reviewed at length with the patient today.   The patient does not have concerns regarding her medicines.  The following changes were made today:  none  Labs/ tests ordered today include: none  Disposition:   Follow up with Dr Johney Frame in 6 months    Signed, [name], NP 11/14/2017 3:10 PM  [facility] [address] [place] [address] [phone] (office) [phone] (fax)

## 2017-11-14 ENCOUNTER — Encounter: Payer: Self-pay | Admitting: Nurse Practitioner

## 2017-11-14 ENCOUNTER — Ambulatory Visit (INDEPENDENT_AMBULATORY_CARE_PROVIDER_SITE_OTHER): Payer: Medicare Other | Admitting: Nurse Practitioner

## 2017-11-14 VITALS — BP 138/90 | HR 86 | Ht 65.5 in | Wt 126.8 lb

## 2017-11-14 DIAGNOSIS — I2510 Atherosclerotic heart disease of native coronary artery without angina pectoris: Secondary | ICD-10-CM | POA: Diagnosis not present

## 2017-11-14 DIAGNOSIS — I10 Essential (primary) hypertension: Secondary | ICD-10-CM | POA: Diagnosis not present

## 2017-11-14 DIAGNOSIS — I480 Paroxysmal atrial fibrillation: Secondary | ICD-10-CM | POA: Diagnosis not present

## 2017-11-14 DIAGNOSIS — I495 Sick sinus syndrome: Secondary | ICD-10-CM | POA: Diagnosis not present

## 2017-11-14 DIAGNOSIS — Z87891 Personal history of nicotine dependence: Secondary | ICD-10-CM

## 2017-11-14 LAB — CUP PACEART INCLINIC DEVICE CHECK
Date Time Interrogation Session: 20190320113312
MDC IDC PG IMPLANT DT: 20160309

## 2017-11-14 NOTE — Patient Instructions (Addendum)
Medication Instructions:   Your physician recommends that you continue on your current medications as directed. Please refer to the Current Medication list given to you today.   If you need a refill on your cardiac medications before your next appointment, please call your pharmacy.  Labwork:   BMET AND CBC TODAY     Testing/Procedures: NONE ORDERED  TODAY    Follow-Up:  Your physician wants you to follow-up in:  IN 6  MONTHS WITH DR  Johney Frame  You will receive a reminder letter in the mail two months in advance. If you don't receive a letter, please call our office to schedule the follow-up appointment.      Any Other Special Instructions Will Be Listed Below (If Applicable).

## 2017-11-15 LAB — CBC
HEMOGLOBIN: 14.8 g/dL (ref 11.1–15.9)
Hematocrit: 46.7 % — ABNORMAL HIGH (ref 34.0–46.6)
MCH: 25.9 pg — ABNORMAL LOW (ref 26.6–33.0)
MCHC: 31.7 g/dL (ref 31.5–35.7)
MCV: 82 fL (ref 79–97)
Platelets: 356 10*3/uL (ref 150–379)
RBC: 5.72 x10E6/uL — ABNORMAL HIGH (ref 3.77–5.28)
RDW: 15.4 % (ref 12.3–15.4)
WBC: 4.7 10*3/uL (ref 3.4–10.8)

## 2017-11-15 LAB — BASIC METABOLIC PANEL
BUN/Creatinine Ratio: 12 (ref 12–28)
BUN: 12 mg/dL (ref 8–27)
CO2: 22 mmol/L (ref 20–29)
CREATININE: 1.02 mg/dL — AB (ref 0.57–1.00)
Calcium: 10.2 mg/dL (ref 8.7–10.3)
Chloride: 102 mmol/L (ref 96–106)
GFR calc Af Amer: 68 mL/min/{1.73_m2} (ref >59)
GFR calc non Af Amer: 59 mL/min/{1.73_m2} — ABNORMAL LOW (ref >59)
GLUCOSE: 103 mg/dL — AB (ref 65–99)
POTASSIUM: 5.2 mmol/L (ref 3.5–5.2)
SODIUM: 137 mmol/L (ref 134–144)

## 2017-11-16 ENCOUNTER — Telehealth: Payer: Self-pay | Admitting: Nurse Practitioner

## 2017-11-16 NOTE — Telephone Encounter (Signed)
New Message    Patient calling regarding her blood work

## 2017-11-16 NOTE — Telephone Encounter (Signed)
SPOKE TO PT ABOUT LAB WORK AND PT VERBALIZED UNDERSTANDING.  PT WOULD LIKE PHONE CALL FOR RESULTS DUE TO NOT USING MY CHART

## 2017-12-26 ENCOUNTER — Telehealth: Payer: Self-pay | Admitting: Emergency Medicine

## 2017-12-26 NOTE — Telephone Encounter (Signed)
Called patient to schedule AWV. Pt declined at this time. 

## 2018-01-24 ENCOUNTER — Other Ambulatory Visit: Payer: Self-pay | Admitting: Cardiology

## 2018-01-24 DIAGNOSIS — I10 Essential (primary) hypertension: Secondary | ICD-10-CM

## 2018-01-24 NOTE — Telephone Encounter (Signed)
Rx sent to pharmacy   

## 2018-02-01 ENCOUNTER — Other Ambulatory Visit: Payer: Self-pay | Admitting: *Deleted

## 2018-02-01 DIAGNOSIS — I679 Cerebrovascular disease, unspecified: Secondary | ICD-10-CM

## 2018-03-01 ENCOUNTER — Encounter: Payer: Self-pay | Admitting: Cardiology

## 2018-03-01 ENCOUNTER — Ambulatory Visit (INDEPENDENT_AMBULATORY_CARE_PROVIDER_SITE_OTHER): Payer: Medicare Other | Admitting: Cardiology

## 2018-03-01 VITALS — BP 148/88 | HR 79 | Ht 65.5 in | Wt 119.0 lb

## 2018-03-01 DIAGNOSIS — I679 Cerebrovascular disease, unspecified: Secondary | ICD-10-CM | POA: Diagnosis not present

## 2018-03-01 DIAGNOSIS — I480 Paroxysmal atrial fibrillation: Secondary | ICD-10-CM

## 2018-03-01 DIAGNOSIS — E7800 Pure hypercholesterolemia, unspecified: Secondary | ICD-10-CM | POA: Diagnosis not present

## 2018-03-01 DIAGNOSIS — R5383 Other fatigue: Secondary | ICD-10-CM

## 2018-03-01 DIAGNOSIS — I2510 Atherosclerotic heart disease of native coronary artery without angina pectoris: Secondary | ICD-10-CM

## 2018-03-01 MED ORDER — ATORVASTATIN CALCIUM 80 MG PO TABS: 80.0000 mg | ORAL_TABLET | Freq: Every day | ORAL | 3 refills | Status: DC

## 2018-03-01 MED ORDER — LISINOPRIL 10 MG PO TABS: 10.0000 mg | ORAL_TABLET | Freq: Every day | ORAL | 3 refills | Status: DC

## 2018-03-01 NOTE — Patient Instructions (Signed)
Medication Instructions:   STOP ASPIRIN  RESTART ATORVASTATIN 80 MG ONCE DAILY  INCREASE LISINOPRIL TO 10 MG ONCE DAILY= 2 OF THE 5 MG TABLETS ONCE DAILY  Labwork:  Your physician recommends that you return for lab work in: ONE WEEK  Your physician recommends that you return for lab work in: 4 WEEKS PRIOR TO EATING  Testing/Procedures:  Your physician has requested that you have a carotid duplex. This test is an ultrasound of the carotid arteries in your neck. It looks at blood flow through these arteries that supply the brain with blood. Allow one hour for this exam. There are no restrictions or special instructions.SCHEDULE IN NOVEMBER    Follow-Up:  Your physician wants you to follow-up in: 6 MONTHS WITH DR Jens Som You will receive a reminder letter in the mail two months in advance. If you don't receive a letter, please call our office to schedule the follow-up appointment.   If you need a refill on your cardiac medications before your next appointment, please call your pharmacy.

## 2018-03-01 NOTE — Progress Notes (Signed)
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  Admitted 11/17 withNSTEMI (troponin peak of 4.10) and had subsequent CABG. Initial cath showed a widely patent LCx stent but there was evidence of focal thrombus at the distal LM and ostial LAD. She was continued on Aggrastat and a relook cath was performed on 11/17 which showed 40-50% distal LMCA haziness and 70-80% ostial LAD stenosis. Echocardiogram November 2017 showed ejection fraction 55-60%, grade 2 diastolic dysfunction, mild left atrial enlargement, mild mitral regurgitation and moderate tricuspid regurgitation. Had CABG on 07/17/2016 with LIMA-LAD, SVG-OM, and SVG-dRCA). Also with h/o PAF.  Carotid Dopplers November 2018 showed 1 to 39% right and 40 to 59% left stenosis.  Since last seen,  patient describes fatigue and weight loss of 10 pounds.  She denies chest pain or dyspnea.  No syncope.  Current Outpatient Medications  Medication Sig Dispense Refill  . aspirin EC 81 MG tablet Take 1 tablet (81 mg total) by mouth daily.    [name] ELIQUIS 5 MG TABS tablet TAKE 1 TABLET BY MOUTH TWICE DAILY 180 tablet 1  . lisinopril (PRINIVIL,ZESTRIL) 5 MG tablet Take 1 tablet (5 mg total) by mouth daily. Please call office for follow up appointment for further refills. 90 tablet 0  . metoprolol tartrate (LOPRESSOR) 25 MG tablet TAKE 1/2 TABLET(12.5 MG) BY MOUTH TWICE DAILY. FOLLOW UP APPOINTMENT FOR FURTHER REFILLS 60 tablet 0  . nitroGLYCERIN (NITROSTAT) 0.4 MG SL tablet Place 0.4 mg under the tongue every 5 (five) minutes as needed for chest pain.     No current facility-administered medications for this visit.      Past Medical History:  Diagnosis Date  . Abnormal LFTs    a. in the past when on Lipitor  . Anemia    as a young woman  . Anxiety   . Arnold-Chiari malformation, type I (HCC)    MRI brain 02/2010  . CAD (coronary artery disease)      a. NSTEMI 07/2009: LHC - D2 40%, LAD irreg., EF 50% with apical AK (tako-tsubo CM);  b. Inf STEMI (04/2013): Tx Promus DES to CFX;  c. 08/2013 Lexi CL: No ischemia, dist ant/ap/inf-ap infarct, EF  d. 06/2016 NSTEMI with LM disease: s/p CABG on 07/17/2016 w/ LIMA-LAD, SVG-OM, and SVG-dRCA.  [name] DEPRESSION   . Dizziness    ? CVA 01/2010 - carotid dopplers with no ICA stenosis  . GERD (gastroesophageal reflux disease)   . HYPERLIPIDEMIA   . HYPERTENSION   . MI (myocardial infarction) (HCC)   . PAD (peripheral artery disease) (HCC)    s/p L fem pop 11/2013 in setting of 1st toe osteo/gangrene  . Paroxysmal atrial fibrillation (HCC)    a. anticoagulated with Eliquis 10/2014  . RA (rheumatoid arthritis) (HCC)    prior tx by Dr. Dareen Piano  . Sjogren's syndrome (HCC) 06/02/13   pt denies this (12/01/13)  . SLE (systemic lupus erythematosus) (HCC) dx 05/2013   follows with rheum Dareen Piano)  . Tachy-brady syndrome (HCC)    a. s/p STJ Leadless pacemaker 11-04-2014 Dr Johney Frame  . 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 ABDOMINAL AORTAGRAM N/A 11/24/2013   Procedure: ABDOMINAL AORTAGRAM WITH BILATERAL RUNOFF WITH POSSIBLE INTERVENTION;  Surgeon: Chuck Hint, MD;  Location: [facility] CATH LAB;  Service:  Cardiovascular;  Laterality: N/A;  . AMPUTATION Left 12/02/2013   Procedure: LEFT 1ST TOE AMPUTATION;  Surgeon: Sherren Kerns, MD;  Location: [facility] OR;  Service: Vascular;  Laterality: Left;  . CARDIAC CATHETERIZATION N/A 07/12/2016   Procedure: Left Heart Cath and Coronary Angiography;  Surgeon: Lennette Bihari, MD;  Location: [facility] INVASIVE CV LAB;  Service: Cardiovascular;  Laterality: N/A;  . CARDIAC CATHETERIZATION N/A 07/14/2016   Procedure: Intravascular Ultrasound/IVUS;  Surgeon: Yvonne Kendall, MD;  Location: [facility] INVASIVE CV LAB;  Service: Cardiovascular;  Laterality: N/A;  .  COLONOSCOPY    . CORONARY ANGIOPLASTY  04/2013  . CORONARY ARTERY BYPASS GRAFT N/A 07/17/2016   Procedure: CORONARY ARTERY BYPASS GRAFTING (CABG)x3 using right greater saphenous vein harvested endoscopiclly and left internal mammary artery;  Surgeon: Kerin Perna, MD;  Location: [facility] OR;  Service: Open Heart Surgery;  Laterality: N/A;  . FEMORAL-POPLITEAL BYPASS GRAFT Left 12/02/2013   Procedure:  LEFT FEMORAL-BELOW KNEE POPLITEAL ARTERY BYPASS GRAFT;  Surgeon: Sherren Kerns, MD;  Location: [facility] OR;  Service: Vascular;  Laterality: Left;  . LEFT HEART CATHETERIZATION WITH CORONARY ANGIOGRAM N/A 05/19/2013   Procedure: LEFT HEART CATHETERIZATION WITH CORONARY ANGIOGRAM;  Surgeon: Micheline Chapman, MD;  Location: [facility] CATH LAB;  Service: Cardiovascular;  Laterality: N/A;  . LEFT HEART CATHETERIZATION WITH CORONARY ANGIOGRAM N/A 10/28/2014   Procedure: LEFT HEART CATHETERIZATION WITH CORONARY ANGIOGRAM;  Surgeon: Iran Ouch, MD;  Location: [facility] CATH LAB;  Service: Cardiovascular;  Laterality: N/A;  . PERCUTANEOUS CORONARY STENT INTERVENTION (PCI-S)  05/19/2013   Procedure: PERCUTANEOUS CORONARY STENT INTERVENTION (PCI-S);  Surgeon: Micheline Chapman, MD;  Location: [facility] CATH LAB;  Service: Cardiovascular;;  . PERMANENT PACEMAKER INSERTION N/A 11/04/2014   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  . TEE WITHOUT CARDIOVERSION N/A 07/17/2016   Procedure: TRANSESOPHAGEAL ECHOCARDIOGRAM (TEE);  Surgeon: Kerin Perna, MD;  Location: [facility] OR;  Service: Open Heart Surgery;  Laterality: N/A;  . TUBAL LIGATION      Social History   Socioeconomic History  . Marital status: Single    Spouse name: Not on file  . Number of children: 3  . Years of education: Not on file  . Highest education level: Not on file  Occupational History  . Not on file  Social Needs  . Financial resource strain: Not on file  . Food insecurity:    Worry: Not on file    Inability: Not on file  . Transportation  needs:    Medical: Not on file    Non-medical: Not on file  Tobacco Use  . Smoking status: Former Smoker    Types: Cigarettes    Last attempt to quit: 05/19/2016    Years since quitting: 1.7  . Smokeless tobacco: Never Used  . Tobacco comment:    Substance and Sexual Activity  . Alcohol use: No    Alcohol/week: 0.0 oz    Frequency: Never  . Drug use: No  . Sexual activity: Not on file  Lifestyle  . Physical activity:    Days per week: Not on file    Minutes per session: Not on file  . Stress: Not on file  Relationships  . Social connections:    Talks on phone: Not on file    Gets together: Not on file    Attends religious service: Not on file    Active member of club or organization: Not on file    Attends meetings of clubs or organizations:  Not on file    Relationship status: Not on file  . Intimate partner violence:    Fear of current or ex partner: Not on file    Emotionally abused: Not on file    Physically abused: Not on file    Forced sexual activity: Not on file  Other Topics Concern  . Not on file  Social History Narrative   Lives alone.    Family History  Problem Relation Age of Onset  . Arthritis Mother   . Emphysema Mother   . Arthritis Father   . COPD Father   . Diabetes Other   . Thyroid disease Neg Hx     ROS: Fatigue but no fevers or chills, productive cough, hemoptysis, dysphasia, odynophagia, melena, hematochezia, dysuria, hematuria, rash, seizure activity, orthopnea, PND, pedal edema, claudication. Remaining systems are negative.  Physical Exam: Well-developed well-nourished in no acute distress.  Skin is warm and dry.  HEENT is normal.  Neck is supple.  Chest is clear to auscultation with normal expansion.  Cardiovascular exam is regular rate and rhythm.  Abdominal exam nontender or distended. No masses palpated. Extremities show no edema. neuro grossly intact  ECG-sinus rhythm at a rate of 79.  Left anterior fascicular block.  RV  conduction delay.  Personally reviewed  A/P  1 coronary artery disease status post coronary artery bypass graft-patient denies chest pain.  Continue medical therapy with aspirin and resume statin.  2 carotid artery disease-we will arrange follow-up carotid Dopplers November 2019.  3 peripheral vascular disease-this is followed by vascular surgery.  4 hypertension-blood pressure is elevated.  Increase lisinopril to 10 mg daily and follow.  Check potassium and renal function in 1 week.  Given complaints of fatigue I will also check TSH and hemoglobin.  5 hyperlipidemia-resume Lipitor 80 mg daily and check lipids and liver in 4 weeks.  6 paroxysmal atrial fibrillation-patient remains in sinus rhythm today.  Continue beta-blocker for rate control if atrial fibrillation recurs.  Continue apixaban.  7 tobacco abuse-patient counseled on discontinuing.  Patient states she has discontinued for the past 3 months.  8 prior pacemaker-followed by electrophysiology.  9 weight loss fatigue-check CBC and TSH.  I have asked her to follow-up with primary care for this issue.  Olga Millers, MD

## 2018-04-01 ENCOUNTER — Ambulatory Visit (INDEPENDENT_AMBULATORY_CARE_PROVIDER_SITE_OTHER): Payer: Medicare Other | Admitting: Internal Medicine

## 2018-04-01 VITALS — BP 140/80 | HR 58 | Ht 65.0 in | Wt 117.8 lb

## 2018-04-01 DIAGNOSIS — I2510 Atherosclerotic heart disease of native coronary artery without angina pectoris: Secondary | ICD-10-CM

## 2018-04-01 DIAGNOSIS — I10 Essential (primary) hypertension: Secondary | ICD-10-CM

## 2018-04-01 DIAGNOSIS — Z950 Presence of cardiac pacemaker: Secondary | ICD-10-CM

## 2018-04-01 DIAGNOSIS — I495 Sick sinus syndrome: Secondary | ICD-10-CM

## 2018-04-01 DIAGNOSIS — I480 Paroxysmal atrial fibrillation: Secondary | ICD-10-CM

## 2018-04-01 DIAGNOSIS — Z951 Presence of aortocoronary bypass graft: Secondary | ICD-10-CM

## 2018-04-01 NOTE — Progress Notes (Signed)
PCP: Pincus Sanes, MD Primary Cardiologist: Dr Jens Som Primary EP: Dr Johney Frame  Mercedes [name] is a 62 y.o. female who presents today for routine electrophysiology followup.  Since last being seen in our clinic, the patient reports doing reasonably well.  + fatigue.  Losing weight.  She saw Dr Jens Som recently and he ordered blood work. She did not have this performed.  She states "I had my great grand baby and forgot".  Today, she denies symptoms of palpitations, chest pain, shortness of breath,  lower extremity edema, dizziness, presyncope, or syncope.  The patient is otherwise without complaint today.   Past Medical History:  Diagnosis Date  . Abnormal LFTs    a. in the past when on Lipitor  . Anemia    as a young woman  . Anxiety   . Arnold-Chiari malformation, type I (HCC)    MRI brain 02/2010  . CAD (coronary artery disease)    a. NSTEMI 07/2009: LHC - D2 40%, LAD irreg., EF 50% with apical AK (tako-tsubo CM);  b. Inf STEMI (04/2013): Tx Promus DES to CFX;  c. 08/2013 Lexi CL: No ischemia, dist ant/ap/inf-ap infarct, EF  d. 06/2016 NSTEMI with LM disease: s/p CABG on 07/17/2016 w/ LIMA-LAD, SVG-OM, and SVG-dRCA.  [name] DEPRESSION   . Dizziness    ? CVA 01/2010 - carotid dopplers with no ICA stenosis  . GERD (gastroesophageal reflux disease)   . HYPERLIPIDEMIA   . HYPERTENSION   . MI (myocardial infarction) (HCC)   . PAD (peripheral artery disease) (HCC)    s/p L fem pop 11/2013 in setting of 1st toe osteo/gangrene  . Paroxysmal atrial fibrillation (HCC)    a. anticoagulated with Eliquis 10/2014  . RA (rheumatoid arthritis) (HCC)    prior tx by Dr. Dareen Piano  . Sjogren's syndrome (HCC) 06/02/13   pt denies this (12/01/13)  . SLE (systemic lupus erythematosus) (HCC) dx 05/2013   follows with rheum Dareen Piano)  . Tachy-brady syndrome (HCC)    a. s/p STJ Leadless pacemaker 11-04-2014 Dr Johney Frame  . 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 ABDOMINAL AORTAGRAM N/A 11/24/2013   Procedure: ABDOMINAL AORTAGRAM WITH BILATERAL RUNOFF WITH POSSIBLE INTERVENTION;  Surgeon: Chuck Hint, MD;  Location: [facility] CATH LAB;  Service: Cardiovascular;  Laterality: N/A;  . AMPUTATION Left 12/02/2013   Procedure: LEFT 1ST TOE AMPUTATION;  Surgeon: Sherren Kerns, MD;  Location: [facility] OR;  Service: Vascular;  Laterality: Left;  . CARDIAC CATHETERIZATION N/A 07/12/2016   Procedure: Left Heart Cath and Coronary Angiography;  Surgeon: Lennette Bihari, MD;  Location: [facility] INVASIVE CV LAB;  Service: Cardiovascular;  Laterality: N/A;  . CARDIAC CATHETERIZATION N/A 07/14/2016   Procedure: Intravascular Ultrasound/IVUS;  Surgeon: Yvonne Kendall, MD;  Location: [facility] INVASIVE CV LAB;  Service: Cardiovascular;  Laterality: N/A;  . COLONOSCOPY    . CORONARY ANGIOPLASTY  04/2013  . CORONARY ARTERY BYPASS GRAFT N/A 07/17/2016   Procedure: CORONARY ARTERY BYPASS GRAFTING (CABG)x3 using right greater saphenous vein harvested endoscopiclly and left internal mammary artery;  Surgeon: Kerin Perna, MD;  Location: [facility] OR;  Service: Open Heart Surgery;  Laterality: N/A;  . FEMORAL-POPLITEAL BYPASS GRAFT Left 12/02/2013   Procedure:  LEFT FEMORAL-BELOW KNEE POPLITEAL ARTERY BYPASS GRAFT;  Surgeon: Sherren Kerns, MD;  Location: [facility] OR;  Service: Vascular;  Laterality: Left;  . LEFT HEART CATHETERIZATION  WITH CORONARY ANGIOGRAM N/A 05/19/2013   Procedure: LEFT HEART CATHETERIZATION WITH CORONARY ANGIOGRAM;  Surgeon: Micheline Chapman, MD;  Location: [facility] CATH LAB;  Service: Cardiovascular;  Laterality: N/A;  . LEFT HEART CATHETERIZATION WITH CORONARY ANGIOGRAM N/A 10/28/2014   Procedure: LEFT HEART CATHETERIZATION WITH CORONARY ANGIOGRAM;  Surgeon: Iran Ouch, MD;  Location: [facility] CATH LAB;  Service: Cardiovascular;  Laterality: N/A;  . PERCUTANEOUS CORONARY STENT INTERVENTION (PCI-S)  05/19/2013    Procedure: PERCUTANEOUS CORONARY STENT INTERVENTION (PCI-S);  Surgeon: Micheline Chapman, MD;  Location: [facility] CATH LAB;  Service: Cardiovascular;;  . PERMANENT PACEMAKER INSERTION N/A 11/04/2014   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  . TEE WITHOUT CARDIOVERSION N/A 07/17/2016   Procedure: TRANSESOPHAGEAL ECHOCARDIOGRAM (TEE);  Surgeon: Kerin Perna, MD;  Location: [facility] OR;  Service: Open Heart Surgery;  Laterality: N/A;  . TUBAL LIGATION      ROS- all systems are reviewed and negatives except as per HPI above  Current Outpatient Medications  Medication Sig Dispense Refill  . atorvastatin (LIPITOR) 80 MG tablet Take 1 tablet (80 mg total) by mouth daily. 90 tablet 3  . ELIQUIS 5 MG TABS tablet TAKE 1 TABLET BY MOUTH TWICE DAILY 180 tablet 1  . lisinopril (PRINIVIL,ZESTRIL) 10 MG tablet Take 1 tablet (10 mg total) by mouth daily. 90 tablet 3  . metoprolol tartrate (LOPRESSOR) 25 MG tablet TAKE 1/2 TABLET(12.5 MG) BY MOUTH TWICE DAILY. FOLLOW UP APPOINTMENT FOR FURTHER REFILLS 60 tablet 0  . nitroGLYCERIN (NITROSTAT) 0.4 MG SL tablet Place 0.4 mg under the tongue every 5 (five) minutes as needed for chest pain.     No current facility-administered medications for this visit.     Physical Exam: Vitals:   04/01/18 1536  BP: 140/80  Pulse: (!) 58  SpO2: 97%  Weight: 117 lb 12.8 oz (53.4 kg)  Height: 5\' 5"  (1.651 m)     GEN- The patient is well appearing, alert and oriented x 3 today.   Head- normocephalic, atraumatic Eyes-  Sclera clear, conjunctiva pink Ears- hearing intact Oropharynx- clear Lungs- Clear to ausculation bilaterally, normal work of breathing Heart- Regular rate and rhythm, no murmurs, rubs or gallops, PMI not laterally displaced GI- soft, NT, ND, + BS Extremities- no clubbing, cyanosis, or edema  Wt Readings from Last 3 Encounters:  04/01/18 117 lb 12.8 oz (53.4 kg)  03/01/18 119 lb (54 kg)  11/14/17 126 lb 12.8 oz (57.5 kg)    EKG  tracing ordered today is personally reviewed and shows sinus rhythm 58 bpm, PR 132 msec, QRS 98 msec,. Qtc 422 msec, nonspecific ST/T changes, incomplete RBBB  Assessment and Plan:  1. Bradycardia Normal pacemaker function See Pace Art report No changes today  2. Paroxysmal atrial fibrillation Stable On eliquis  3. HTN Stable No change required today  4. CAD S/p CABG Stable No change required today  5. Tobacco Cessation advised She has quit x 4 months  6. Fatigue I have advised that she have the blood work that Dr 11/16/17 ordered to evaluate her symptoms. Follow-up with PCP.  Follow-up with Dr Jens Som as scheduled Return to see EP NP in 6 months I will see in a year unless problems arise  Jens Som MD, [facility] 04/01/2018 3:43 PM

## 2018-04-01 NOTE — Patient Instructions (Addendum)
Medication Instructions:  Your physician recommends that you continue on your current medications as directed. Please refer to the Current Medication list given to you today.  Please follow up with your PCP  Labwork: You will get lab work today:  BMP, CBC, TSH, lipid panel and hepatic panel  Testing/Procedures: None ordered.  Follow-Up: Your physician wants you to follow-up in: 6 months with [name] NP.   You will receive a reminder letter in the mail two months in advance. If you don't receive a letter, please call our office to schedule the follow-up appointment.  Any Other Special Instructions Will Be Listed Below (If Applicable).  If you need a refill on your cardiac medications before your next appointment, please call your pharmacy.

## 2018-04-02 ENCOUNTER — Telehealth: Payer: Self-pay | Admitting: *Deleted

## 2018-04-02 ENCOUNTER — Other Ambulatory Visit: Payer: Medicare Other

## 2018-04-02 DIAGNOSIS — R945 Abnormal results of liver function studies: Principal | ICD-10-CM

## 2018-04-02 DIAGNOSIS — R7989 Other specified abnormal findings of blood chemistry: Secondary | ICD-10-CM

## 2018-04-02 DIAGNOSIS — R748 Abnormal levels of other serum enzymes: Secondary | ICD-10-CM

## 2018-04-02 LAB — BASIC METABOLIC PANEL
BUN/Creatinine Ratio: 12 (ref 12–28)
BUN: 10 mg/dL (ref 8–27)
CALCIUM: 9.8 mg/dL (ref 8.7–10.3)
CO2: 19 mmol/L — AB (ref 20–29)
CREATININE: 0.83 mg/dL (ref 0.57–1.00)
Chloride: 103 mmol/L (ref 96–106)
GFR calc Af Amer: 86 mL/min/{1.73_m2} (ref >59)
GFR calc non Af Amer: 75 mL/min/{1.73_m2} (ref >59)
GLUCOSE: 92 mg/dL (ref 65–99)
Potassium: 4.8 mmol/L (ref 3.5–5.2)
SODIUM: 140 mmol/L (ref 134–144)

## 2018-04-02 LAB — LIPID PANEL
Chol/HDL Ratio: 3.1 ratio (ref 0.0–4.4)
Cholesterol, Total: 168 mg/dL (ref 100–199)
HDL: 54 mg/dL (ref >39)
LDL Calculated: 92 mg/dL (ref 0–99)
TRIGLYCERIDES: 111 mg/dL (ref 0–149)
VLDL CHOLESTEROL CAL: 22 mg/dL (ref 5–40)

## 2018-04-02 LAB — HEPATIC FUNCTION PANEL
ALT: 10 IU/L (ref 0–32)
AST: 17 IU/L (ref 0–40)
Albumin: 4.1 g/dL (ref 3.6–4.8)
Alkaline Phosphatase: 166 IU/L — ABNORMAL HIGH (ref 39–117)
BILIRUBIN, DIRECT: 0.15 mg/dL (ref 0.00–0.40)
Bilirubin Total: 0.4 mg/dL (ref 0.0–1.2)
Total Protein: 8.1 g/dL (ref 6.0–8.5)

## 2018-04-02 LAB — CBC
HEMATOCRIT: 41.4 % (ref 34.0–46.6)
HEMOGLOBIN: 13.4 g/dL (ref 11.1–15.9)
MCH: 25.4 pg — ABNORMAL LOW (ref 26.6–33.0)
MCHC: 32.4 g/dL (ref 31.5–35.7)
MCV: 78 fL — ABNORMAL LOW (ref 79–97)
PLATELETS: 360 10*3/uL (ref 150–450)
RBC: 5.28 x10E6/uL (ref 3.77–5.28)
RDW: 15.6 % — ABNORMAL HIGH (ref 12.3–15.4)
WBC: 6.2 10*3/uL (ref 3.4–10.8)

## 2018-04-02 LAB — TSH: TSH: 0.593 u[IU]/mL (ref 0.450–4.500)

## 2018-04-02 NOTE — Telephone Encounter (Addendum)
Left message for pt to call   ----- Message from Lewayne Bunting, MD sent at 04/02/2018 10:00 AM EDT ----- Check 5'nucleotidase and GGT Olga Millers

## 2018-04-02 NOTE — Telephone Encounter (Signed)
Spoke with pt, aware of results. She will go by the [address] office for labs today. Orders placed and appointment made.

## 2018-04-03 ENCOUNTER — Telehealth: Payer: Self-pay | Admitting: *Deleted

## 2018-04-03 DIAGNOSIS — R7989 Other specified abnormal findings of blood chemistry: Secondary | ICD-10-CM

## 2018-04-03 DIAGNOSIS — R945 Abnormal results of liver function studies: Principal | ICD-10-CM

## 2018-04-03 LAB — NUCLEOTIDASE, 5', BLOOD: 5-Nucleotidase: 5 IU/L (ref 0–10)

## 2018-04-03 LAB — GAMMA GT: GGT: 105 IU/L — ABNORMAL HIGH (ref 0–60)

## 2018-04-03 NOTE — Telephone Encounter (Addendum)
Spoke with pt, aware of results. Will call her back with the date and time of [place]. Paperwork for repeat labs mailed to the patient.  ----- Message from Lewayne Bunting, MD sent at 04/03/2018 11:13 AM EDT ----- Check liver ultrasound; repeat LFTs 8 weeks Olga Millers

## 2018-04-04 NOTE — Telephone Encounter (Signed)
Abdominal US scheduled at [address] 04-05-18 @ 10 am. She will need to fast after midnight tonight for the test. The scan will take 30 minutes. Left message for pt to call

## 2018-04-05 ENCOUNTER — Other Ambulatory Visit: Payer: Medicare Other

## 2018-04-08 ENCOUNTER — Telehealth: Payer: Self-pay | Admitting: Internal Medicine

## 2018-04-08 NOTE — Telephone Encounter (Signed)
New message  Patient is calling to have Dr. Johney Frame to provide her with a referral to another provider. The patient did not release information on the provider that she wants the referral for or why she wants the referral. Patient would like a call to discuss this information.

## 2018-04-08 NOTE — Telephone Encounter (Signed)
Spoke to patient would wants know if Dr Johney Frame would have a liver specialist referral.  Please call, thank you.

## 2018-04-10 NOTE — Telephone Encounter (Signed)
Patient was a no show for liver US. Spoke with pt, she does not want to have the liver US right now, she would like to see a GI doctor about this first. Referral placed for [facility].

## 2018-04-18 NOTE — Telephone Encounter (Signed)
Left message per DPR.  Advised GI Dr. Leone Payor with [facility].  Advised to call back if any further questions.

## 2018-04-24 ENCOUNTER — Other Ambulatory Visit: Payer: Self-pay

## 2018-04-24 ENCOUNTER — Other Ambulatory Visit: Payer: Self-pay | Admitting: Cardiology

## 2018-04-24 DIAGNOSIS — I10 Essential (primary) hypertension: Secondary | ICD-10-CM

## 2018-04-24 MED ORDER — METOPROLOL TARTRATE 25 MG PO TABS: 12.5000 mg | ORAL_TABLET | Freq: Two times a day (BID) | ORAL | 3 refills | Status: DC

## 2018-05-21 LAB — CUP PACEART INCLINIC DEVICE CHECK
Date Time Interrogation Session: 20190924114018
MDC IDC PG IMPLANT DT: 20160309

## 2018-05-23 ENCOUNTER — Encounter: Payer: Self-pay | Admitting: Gastroenterology

## 2018-06-27 ENCOUNTER — Other Ambulatory Visit: Payer: Self-pay

## 2018-06-27 ENCOUNTER — Encounter: Payer: Self-pay | Admitting: Gastroenterology

## 2018-06-27 ENCOUNTER — Ambulatory Visit (INDEPENDENT_AMBULATORY_CARE_PROVIDER_SITE_OTHER): Payer: Medicare Other | Admitting: Gastroenterology

## 2018-06-27 ENCOUNTER — Other Ambulatory Visit (INDEPENDENT_AMBULATORY_CARE_PROVIDER_SITE_OTHER): Payer: Medicare Other

## 2018-06-27 VITALS — BP 158/78 | HR 72 | Ht 65.0 in | Wt 119.0 lb

## 2018-06-27 DIAGNOSIS — R1013 Epigastric pain: Secondary | ICD-10-CM

## 2018-06-27 DIAGNOSIS — Z1212 Encounter for screening for malignant neoplasm of rectum: Secondary | ICD-10-CM

## 2018-06-27 DIAGNOSIS — R748 Abnormal levels of other serum enzymes: Secondary | ICD-10-CM

## 2018-06-27 DIAGNOSIS — Z1211 Encounter for screening for malignant neoplasm of colon: Secondary | ICD-10-CM | POA: Diagnosis not present

## 2018-06-27 DIAGNOSIS — Z7901 Long term (current) use of anticoagulants: Secondary | ICD-10-CM

## 2018-06-27 LAB — HEPATIC FUNCTION PANEL
ALK PHOS: 90 U/L (ref 39–117)
ALT: 6 U/L (ref 0–35)
AST: 13 U/L (ref 0–37)
Albumin: 3.9 g/dL (ref 3.5–5.2)
BILIRUBIN DIRECT: 0.1 mg/dL (ref 0.0–0.3)
Total Bilirubin: 0.4 mg/dL (ref 0.2–1.2)
Total Protein: 8.7 g/dL — ABNORMAL HIGH (ref 6.0–8.3)

## 2018-06-27 MED ORDER — PANTOPRAZOLE SODIUM 40 MG PO TBEC: 40.0000 mg | DELAYED_RELEASE_TABLET | Freq: Every day | ORAL | 11 refills | Status: DC

## 2018-06-27 NOTE — Progress Notes (Signed)
History of Present Illness: This is a 62 year old female referred by Binnie Rail, MD and Kirk Ruths, MD for the evaluation of an elevated alk phos, elevated GGT and a normal 5' NT were noted in August 2019.  Per review of Epic for the past several years LFTs have been entirely normal.  Total protein has been mildly elevated.  She relates ongoing problems with mild epigastric pain, occasional fullness and frequent belching.  Symptoms have been present for several months.  She has no other gastrointestinal complaints.  She states she had a colonoscopy performed around 2006 - 2008 and she recalls it was normal.  I cannot find a record of a prior colonoscopy in Epic.  No recent abdominal imaging found in Epic to review. Denies weight loss, constipation, diarrhea, change in stool caliber, melena, hematochezia, nausea, vomiting, dysphagia, chest pain.    Allergies  Allergen Reactions  . Brilinta [Ticagrelor] Anaphylaxis    Also chest tightness   Outpatient Medications Prior to Visit  Medication Sig Dispense Refill  . atorvastatin (LIPITOR) 80 MG tablet Take 1 tablet (80 mg total) by mouth daily. 90 tablet 3  . ELIQUIS 5 MG TABS tablet TAKE 1 TABLET BY MOUTH TWICE DAILY 180 tablet 3  . lisinopril (PRINIVIL,ZESTRIL) 5 MG tablet TAKE 1 TABLET(5 MG) BY MOUTH DAILY. FOLLOW UP APPOINTMENT FOR FURTHER REFILLS 90 tablet 3  . metoprolol tartrate (LOPRESSOR) 25 MG tablet Take 0.5 tablets (12.5 mg total) by mouth 2 (two) times daily. (Patient taking differently: Take 12.5 mg by mouth daily. ) 180 tablet 3  . nitroGLYCERIN (NITROSTAT) 0.4 MG SL tablet Place 0.4 mg under the tongue every 5 (five) minutes as needed for chest pain.    [name] lisinopril (PRINIVIL,ZESTRIL) 10 MG tablet Take 1 tablet (10 mg total) by mouth daily. 90 tablet 3   No facility-administered medications prior to visit.    Past Medical History:  Diagnosis Date  . Abnormal LFTs    a. in the past when on Lipitor  . Anemia    as a  young woman  . Anxiety   . Arnold-Chiari malformation, type I ([place])    MRI brain 02/2010  . CAD (coronary artery disease)    a. NSTEMI 07/2009: LHC - D2 40%, LAD irreg., EF 50% with apical AK (tako-tsubo CM);  b. Inf STEMI (04/2013): Tx Promus DES to CFX;  c. 08/2013 Lexi CL: No ischemia, dist ant/ap/inf-ap infarct, EF  d. 06/2016 NSTEMI with LM disease: s/p CABG on 07/17/2016 w/ LIMA-LAD, SVG-OM, and SVG-dRCA.  [name] DEPRESSION   . Dizziness    ? CVA 01/2010 - carotid dopplers with no ICA stenosis  . GERD (gastroesophageal reflux disease)   . HYPERLIPIDEMIA   . HYPERTENSION   . MI (myocardial infarction) ([place])   . PAD (peripheral artery disease) ([place])    s/p L fem pop 11/2013 in setting of 1st toe osteo/gangrene  . Paroxysmal atrial fibrillation (HCC)    a. anticoagulated with Eliquis 10/2014  . RA (rheumatoid arthritis) ([place])    prior tx by Dr. Ouida Sills  . Sjogren's syndrome ([place]) 06/02/13   pt denies this (12/01/13)  . SLE (systemic lupus erythematosus) ([place]) dx 05/2013   follows with rheum Ouida Sills)  . Tachy-brady syndrome (HCC)    a. s/p STJ Leadless pacemaker 11-04-2014 Dr Rayann Heman  . 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  ABDOMINAL AORTAGRAM N/A 11/24/2013   Procedure: ABDOMINAL AORTAGRAM WITH BILATERAL RUNOFF WITH POSSIBLE INTERVENTION;  Surgeon: Angelia Mould, MD;  Location: [facility] CATH LAB;  Service: Cardiovascular;  Laterality: N/A;  . AMPUTATION Left 12/02/2013   Procedure: LEFT 1ST TOE AMPUTATION;  Surgeon: Elam Dutch, MD;  Location: [place];  Service: Vascular;  Laterality: Left;  . CARDIAC CATHETERIZATION N/A 07/12/2016   Procedure: Left Heart Cath and Coronary Angiography;  Surgeon: Troy Sine, MD;  Location: [place] CV LAB;  Service: Cardiovascular;  Laterality: N/A;  . CARDIAC CATHETERIZATION N/A 07/14/2016   Procedure: Intravascular  Ultrasound/IVUS;  Surgeon: Nelva Bush, MD;  Location: [place] CV LAB;  Service: Cardiovascular;  Laterality: N/A;  . COLONOSCOPY    . CORONARY ANGIOPLASTY  04/2013  . CORONARY ARTERY BYPASS GRAFT N/A 07/17/2016   Procedure: CORONARY ARTERY BYPASS GRAFTING (CABG)x3 using right greater saphenous vein harvested endoscopiclly and left internal mammary artery;  Surgeon: Ivin Poot, MD;  Location: [place];  Service: Open Heart Surgery;  Laterality: N/A;  . FEMORAL-POPLITEAL BYPASS GRAFT Left 12/02/2013   Procedure:  LEFT FEMORAL-BELOW KNEE POPLITEAL ARTERY BYPASS GRAFT;  Surgeon: Elam Dutch, MD;  Location: [place];  Service: Vascular;  Laterality: Left;  . LEFT HEART CATHETERIZATION WITH CORONARY ANGIOGRAM N/A 05/19/2013   Procedure: LEFT HEART CATHETERIZATION WITH CORONARY ANGIOGRAM;  Surgeon: Blane Ohara, MD;  Location: [facility] CATH LAB;  Service: Cardiovascular;  Laterality: N/A;  . LEFT HEART CATHETERIZATION WITH CORONARY ANGIOGRAM N/A 10/28/2014   Procedure: LEFT HEART CATHETERIZATION WITH CORONARY ANGIOGRAM;  Surgeon: Wellington Hampshire, MD;  Location: [facility] CATH LAB;  Service: Cardiovascular;  Laterality: N/A;  . PERCUTANEOUS CORONARY STENT INTERVENTION (PCI-S)  05/19/2013   Procedure: PERCUTANEOUS CORONARY STENT INTERVENTION (PCI-S);  Surgeon: Blane Ohara, MD;  Location: [facility] CATH LAB;  Service: Cardiovascular;;  . PERMANENT PACEMAKER INSERTION N/A 11/04/2014   STJ Leadless pacemaker implanted by Dr Rayann Heman for tachy/brady  . TEE WITHOUT CARDIOVERSION N/A 07/17/2016   Procedure: TRANSESOPHAGEAL ECHOCARDIOGRAM (TEE);  Surgeon: Ivin Poot, MD;  Location: [place];  Service: Open Heart Surgery;  Laterality: N/A;  . TUBAL LIGATION     Social History   Socioeconomic History  . Marital status: Single    Spouse name: Not on file  . Number of children: 3  . Years of education: Not on file  . Highest education level: Not on file  Occupational History  . Not on file  Social Needs  .  Financial resource strain: Not on file  . Food insecurity:    Worry: Not on file    Inability: Not on file  . Transportation needs:    Medical: Not on file    Non-medical: Not on file  Tobacco Use  . Smoking status: Former Smoker    Types: Cigarettes    Last attempt to quit: 05/19/2016    Years since quitting: 2.1  . Smokeless tobacco: Never Used  . Tobacco comment:    Substance and Sexual Activity  . Alcohol use: No    Alcohol/week: 0.0 standard drinks    Frequency: Never  . Drug use: No  . Sexual activity: Not on file  Lifestyle  . Physical activity:    Days per week: Not on file    Minutes per session: Not on file  . Stress: Not on file  Relationships  . Social connections:    Talks on phone: Not on file    Gets together: Not on file  Attends religious service: Not on file    Active member of club or organization: Not on file    Attends meetings of clubs or organizations: Not on file    Relationship status: Not on file  Other Topics Concern  . Not on file  Social History Narrative   Lives alone.   Family History  Problem Relation Age of Onset  . Arthritis Mother   . Emphysema Mother   . Arthritis Father   . COPD Father   . Heart disease Father   . Diabetes Paternal Aunt        paternal Aunts  . Heart disease Paternal Aunt   . Thyroid disease Neg Hx        Review of Systems: Pertinent positive and negative review of systems were noted in the above HPI section. All other review of systems were otherwise negative.    Physical Exam: General: Well developed, well nourished, thin, no acute distress Head: Normocephalic and atraumatic Eyes:  sclerae anicteric, EOMI Ears: Normal auditory acuity Mouth: No deformity or lesions Neck: Supple, no masses or thyromegaly Lungs: Clear throughout to auscultation Heart: Regular rate and rhythm; no murmurs, rubs or bruits Abdomen: Soft, epigastric tenderness without rebound or guarding and non distended. No masses,  hepatosplenomegaly or hernias noted. Normal Bowel sounds Rectal: Not done Musculoskeletal: Symmetrical with no gross deformities  Skin: No lesions on visible extremities Pulses:  Normal pulses noted Extremities: No clubbing, cyanosis, edema or deformities noted Neurological: Alert oriented x 4, grossly nonfocal Cervical Nodes:  No significant cervical adenopathy Inguinal Nodes: No significant inguinal adenopathy Psychological:  Alert and cooperative. Normal mood and affect   Assessment and Recommendations:  1.  Epigastric pain, epigastric tenderness, gas, belching.  Suspected GERD or gastritis.  Follow standard antireflux measures and a low gas diet.  Begin pantoprazole 40 mg daily.  Gas-X 4 times daily as needed. TUMS prn. Consider further evaluation with EGD especially if symptoms do not fully respond. REV in 6 weeks.   2.  Mildly elevated alk phos and GGT however 5' NT was normal.  The source of alk phos might not be hepatic.  Obtain fractionated alk phos and repeat LFTs today.  If results indicate source is likely hepatic will proceed with abdominal ultrasound.  If it does not appear a hepatic source will defer further work-up to her PCP. REV in 6 weeks.   3.  CRC screening, average risk.  Patient was advised to proceed with colonoscopy for screening and she states she does not want to proceed at this time but might consider this option in the future.  Will defer to her PCP for other CRC screening options. Follow up with Dr. Quay Burow.   4.  Paroxysmal afib maintained on Eliquis.  5. CAD.  Status post CABG.  Status post pacemaker.  6. HTN with elevated blood pressure today.  It was rechecked today at the end of the visit and it had improved as documented.  The patient states that physician's office is always because her blood pressure elevated and it remains under good control at home.   cc: Binnie Rail, MD [address] [place], [place] [address]

## 2018-06-27 NOTE — Patient Instructions (Signed)
Your provider has requested that you go to the basement level for lab work before leaving today. Press "B" on the elevator. The lab is located at the first door on the left as you exit the elevator.  We have sent the following medications to your pharmacy for you to pick up at your convenience: pantoprazole.   Patient advised to avoid spicy, acidic, citrus, chocolate, mints, fruit and fruit juices.  Limit the intake of caffeine, alcohol and Soda.  Don't exercise too soon after eating.  Don't lie down within 3-4 hours of eating.  Elevate the head of your bed.  You can take Gas-X four times a day as needed for gas and bloating.   You have been given a gas prevention diet.   It has been recommended to you by your physician that you have a(n) Colonoscopy completed. Per your request, we did not schedule the procedure(s) today. Please contact our office at [phone] should you decide to have the procedure completed.  Normal BMI (Body Mass Index- based on height and weight) is between 19 and 25. Your BMI today is Body mass index is 19.8 kg/m. [name] Please consider follow up  regarding your BMI with your Primary Care Provider.  Thank you for choosing me and [facility].  Venita Lick. Pleas Koch., MD., Clementeen Graham

## 2018-06-30 LAB — ALKALINE PHOSPHATASE, ISOENZYMES
Alkaline Phosphatase: 104 IU/L (ref 39–117)
BONE FRACTION: 32 % (ref 14–68)
INTESTINAL FRAC.: 0 % (ref 0–18)
LIVER FRACTION: 68 % (ref 18–85)

## 2018-07-17 ENCOUNTER — Inpatient Hospital Stay (HOSPITAL_COMMUNITY): Admission: RE | Admit: 2018-07-17 | Payer: Medicare Other | Source: Ambulatory Visit

## 2018-08-12 ENCOUNTER — Ambulatory Visit: Payer: Medicare Other | Admitting: Gastroenterology

## 2018-09-19 ENCOUNTER — Encounter: Payer: Self-pay | Admitting: Nurse Practitioner

## 2018-10-08 NOTE — Progress Notes (Signed)
Electrophysiology Office Note Date: 10/11/2018  ID:  Mercedes [name], DOB 15-Jan-1956, MRN [id number]  PCP: Pincus Sanes, MD Primary Cardiologist: Jens Som Electrophysiologist: Allred  CC: Pacemaker follow-up  Mercedes [name] is a 63 y.o. female seen today for Dr Johney Frame.  She presents today for routine electrophysiology followup.  Since last being seen in our clinic, the patient reports doing relatively well.  She is having trouble with depression and not wanting to leave the house.  She denies chest pain, palpitations, dyspnea, PND, orthopnea, nausea, vomiting, dizziness, syncope, edema, weight gain, or early satiety.  Device History: STJ leadless PPM implanted 10/2014 for tachy/brady syndrome  Past Medical History:  Diagnosis Date  . Abnormal LFTs    a. in the past when on Lipitor  . Anemia    as a young woman  . Anxiety   . Arnold-Chiari malformation, type I (HCC)    MRI brain 02/2010  . CAD (coronary artery disease)    a. NSTEMI 07/2009: LHC - D2 40%, LAD irreg., EF 50% with apical AK (tako-tsubo CM);  b. Inf STEMI (04/2013): Tx Promus DES to CFX;  c. 08/2013 Lexi CL: No ischemia, dist ant/ap/inf-ap infarct, EF  d. 06/2016 NSTEMI with LM disease: s/p CABG on 07/17/2016 w/ LIMA-LAD, SVG-OM, and SVG-dRCA.  [name] DEPRESSION   . Dizziness    ? CVA 01/2010 - carotid dopplers with no ICA stenosis  . GERD (gastroesophageal reflux disease)   . HYPERLIPIDEMIA   . HYPERTENSION   . MI (myocardial infarction) (HCC)   . PAD (peripheral artery disease) (HCC)    s/p L fem pop 11/2013 in setting of 1st toe osteo/gangrene  . Paroxysmal atrial fibrillation (HCC)    a. anticoagulated with Eliquis 10/2014  . RA (rheumatoid arthritis) (HCC)    prior tx by Dr. Dareen Piano  . Sjogren's syndrome (HCC) 06/02/13   pt denies this (12/01/13)  . SLE (systemic lupus erythematosus) (HCC) dx 05/2013   follows with rheum Dareen Piano)  . Tachy-brady syndrome (HCC)    a. s/p STJ Leadless pacemaker 11-04-2014 Dr  Johney Frame  . 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 ABDOMINAL AORTAGRAM N/A 11/24/2013   Procedure: ABDOMINAL AORTAGRAM WITH BILATERAL RUNOFF WITH POSSIBLE INTERVENTION;  Surgeon: Chuck Hint, MD;  Location: [facility] CATH LAB;  Service: Cardiovascular;  Laterality: N/A;  . AMPUTATION Left 12/02/2013   Procedure: LEFT 1ST TOE AMPUTATION;  Surgeon: Sherren Kerns, MD;  Location: [facility] OR;  Service: Vascular;  Laterality: Left;  . CARDIAC CATHETERIZATION N/A 07/12/2016   Procedure: Left Heart Cath and Coronary Angiography;  Surgeon: Lennette Bihari, MD;  Location: [facility] INVASIVE CV LAB;  Service: Cardiovascular;  Laterality: N/A;  . CARDIAC CATHETERIZATION N/A 07/14/2016   Procedure: Intravascular Ultrasound/IVUS;  Surgeon: Yvonne Kendall, MD;  Location: [facility] INVASIVE CV LAB;  Service: Cardiovascular;  Laterality: N/A;  . COLONOSCOPY    . CORONARY ANGIOPLASTY  04/2013  . CORONARY ARTERY BYPASS GRAFT N/A 07/17/2016   Procedure: CORONARY ARTERY BYPASS GRAFTING (CABG)x3 using right greater saphenous vein harvested endoscopiclly and left internal mammary artery;  Surgeon: Kerin Perna, MD;  Location: [facility] OR;  Service: Open Heart Surgery;  Laterality: N/A;  . FEMORAL-POPLITEAL BYPASS GRAFT Left 12/02/2013   Procedure:  LEFT FEMORAL-BELOW KNEE POPLITEAL ARTERY BYPASS GRAFT;  Surgeon: Sherren Kerns, MD;  Location: [facility] OR;  Service: Vascular;  Laterality: Left;  .  LEFT HEART CATHETERIZATION WITH CORONARY ANGIOGRAM N/A 05/19/2013   Procedure: LEFT HEART CATHETERIZATION WITH CORONARY ANGIOGRAM;  Surgeon: Micheline Chapman, MD;  Location: [facility] CATH LAB;  Service: Cardiovascular;  Laterality: N/A;  . LEFT HEART CATHETERIZATION WITH CORONARY ANGIOGRAM N/A 10/28/2014   Procedure: LEFT HEART CATHETERIZATION WITH CORONARY ANGIOGRAM;  Surgeon: Iran Ouch, MD;  Location: [facility] CATH LAB;  Service:  Cardiovascular;  Laterality: N/A;  . PERCUTANEOUS CORONARY STENT INTERVENTION (PCI-S)  05/19/2013   Procedure: PERCUTANEOUS CORONARY STENT INTERVENTION (PCI-S);  Surgeon: Micheline Chapman, MD;  Location: [facility] CATH LAB;  Service: Cardiovascular;;  . PERMANENT PACEMAKER INSERTION N/A 11/04/2014   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  . TEE WITHOUT CARDIOVERSION N/A 07/17/2016   Procedure: TRANSESOPHAGEAL ECHOCARDIOGRAM (TEE);  Surgeon: Kerin Perna, MD;  Location: [facility] OR;  Service: Open Heart Surgery;  Laterality: N/A;  . TUBAL LIGATION      Current Outpatient Medications  Medication Sig Dispense Refill  . ELIQUIS 5 MG TABS tablet TAKE 1 TABLET BY MOUTH TWICE DAILY 180 tablet 3  . lisinopril (PRINIVIL,ZESTRIL) 5 MG tablet TAKE 1 TABLET(5 MG) BY MOUTH DAILY. FOLLOW UP APPOINTMENT FOR FURTHER REFILLS 90 tablet 3  . metoprolol tartrate (LOPRESSOR) 25 MG tablet Take 0.5 tablets (12.5 mg total) by mouth 2 (two) times daily. 180 tablet 3  . nitroGLYCERIN (NITROSTAT) 0.4 MG SL tablet Place 1 tablet (0.4 mg total) under the tongue every 5 (five) minutes as needed for chest pain. 25 tablet 3  . atorvastatin (LIPITOR) 80 MG tablet Take 1 tablet (80 mg total) by mouth daily. 90 tablet 3   No current facility-administered medications for this visit.     Allergies:   Brilinta [ticagrelor]   Social History: Social History   Socioeconomic History  . Marital status: Single    Spouse name: Not on file  . Number of children: 3  . Years of education: Not on file  . Highest education level: Not on file  Occupational History  . Not on file  Social Needs  . Financial resource strain: Not on file  . Food insecurity:    Worry: Not on file    Inability: Not on file  . Transportation needs:    Medical: Not on file    Non-medical: Not on file  Tobacco Use  . Smoking status: Former Smoker    Types: Cigarettes    Last attempt to quit: 05/19/2016    Years since quitting: 2.3  .  Smokeless tobacco: Never Used  . Tobacco comment:    Substance and Sexual Activity  . Alcohol use: No    Alcohol/week: 0.0 standard drinks    Frequency: Never  . Drug use: No  . Sexual activity: Not on file  Lifestyle  . Physical activity:    Days per week: Not on file    Minutes per session: Not on file  . Stress: Not on file  Relationships  . Social connections:    Talks on phone: Not on file    Gets together: Not on file    Attends religious service: Not on file    Active member of club or organization: Not on file    Attends meetings of clubs or organizations: Not on file    Relationship status: Not on file  . Intimate partner violence:    Fear of current or ex partner: Not on file    Emotionally abused: Not on file    Physically abused: Not on file  Forced sexual activity: Not on file  Other Topics Concern  . Not on file  Social History Narrative   Lives alone.    Family History: Family History  Problem Relation Age of Onset  . Arthritis Mother   . Emphysema Mother   . Arthritis Father   . COPD Father   . Heart disease Father   . Diabetes Paternal Aunt        paternal Aunts  . Heart disease Paternal Aunt   . Thyroid disease Neg Hx      Review of Systems: All other systems reviewed and are otherwise negative except as noted above.   Physical Exam: VS:  BP 128/84   Pulse 65   Ht 5\' 5"  (1.651 m)   Wt 120 lb 12.8 oz (54.8 kg)   SpO2 98%   BMI 20.10 kg/m  , BMI Body mass index is 20.1 kg/m.  GEN- The patient is well appearing, alert and oriented x 3 today.   HEENT: normocephalic, atraumatic; sclera clear, conjunctiva pink; hearing intact; oropharynx clear; neck supple  Lungs- Clear to ausculation bilaterally, normal work of breathing.  No wheezes, rales, rhonchi Heart- Regular rate and rhythm, no murmurs, rubs or gallops  GI- soft, non-tender, non-distended, bowel sounds present  Extremities- no clubbing, cyanosis, or edema  MS- no significant  deformity or atrophy Skin- warm and dry, no rash or lesion; PPM pocket well healed Psych- euthymic mood, full affect Neuro- strength and sensation are intact  PPM Interrogation- reviewed in detail today,  See PACEART report  EKG:  EKG is not ordered today.  Recent Labs: 03/28/2018: TSH 0.593 06/27/2018: ALT 6 10/09/2018: BUN 10; Creatinine, Ser 0.94; Hemoglobin 14.2; Platelets 289; Potassium 4.8; Sodium 135   Wt Readings from Last 3 Encounters:  10/09/18 120 lb 12.8 oz (54.8 kg)  06/27/18 119 lb (54 kg)  04/01/18 117 lb 12.8 oz (53.4 kg)     Assessment and Plan:  1.  Tachy/brady syndrome Leadless pacemaker at RRT and not responding appropriately to magnet today Discussed options with patient including watchful waiting, changing to another leadless PPM, placing transvenous pacemaker. Pt wishes to proceed with transvenous pacemaker implant. Risks, benefits reviewed with patient who wishes to proceed.  Hold Eliquis night before and morning of procedure.   2. Paroxysmal atrial fibrillation Continue Eliquis for CHADS2VASC of 4 Could consider PVI for increased AF burden CBC, BMET  3.  CAD No recent ischemic symptoms Continue current therapy  4.  HTN Stable No change required today    Current medicines are reviewed at length with the patient today.   The patient does not have concerns regarding her medicines.  The following changes were made today:  none  Labs/ tests ordered today include:  Orders Placed This Encounter  Procedures  . Basic Metabolic Panel (BMET)  . CBC     Disposition:   Follow up with Dr Johney Frame after gen change    Signed, [name], NP 10/11/2018 9:45 AM  [facility] [address] [place] [address] [phone] (office) [phone] (fax)

## 2018-10-09 ENCOUNTER — Ambulatory Visit (INDEPENDENT_AMBULATORY_CARE_PROVIDER_SITE_OTHER): Payer: Medicare Other | Admitting: Nurse Practitioner

## 2018-10-09 ENCOUNTER — Encounter: Payer: Self-pay | Admitting: Nurse Practitioner

## 2018-10-09 VITALS — BP 128/84 | HR 65 | Ht 65.0 in | Wt 120.8 lb

## 2018-10-09 DIAGNOSIS — Z01812 Encounter for preprocedural laboratory examination: Secondary | ICD-10-CM

## 2018-10-09 DIAGNOSIS — I10 Essential (primary) hypertension: Secondary | ICD-10-CM

## 2018-10-09 DIAGNOSIS — I480 Paroxysmal atrial fibrillation: Secondary | ICD-10-CM | POA: Diagnosis not present

## 2018-10-09 DIAGNOSIS — I2510 Atherosclerotic heart disease of native coronary artery without angina pectoris: Secondary | ICD-10-CM

## 2018-10-09 DIAGNOSIS — I495 Sick sinus syndrome: Secondary | ICD-10-CM | POA: Diagnosis not present

## 2018-10-09 LAB — BASIC METABOLIC PANEL
BUN / CREAT RATIO: 11 — AB (ref 12–28)
BUN: 10 mg/dL (ref 8–27)
CO2: 21 mmol/L (ref 20–29)
CREATININE: 0.94 mg/dL (ref 0.57–1.00)
Calcium: 9.4 mg/dL (ref 8.7–10.3)
Chloride: 102 mmol/L (ref 96–106)
GFR calc Af Amer: 74 mL/min/{1.73_m2} (ref >59)
GFR, EST NON AFRICAN AMERICAN: 64 mL/min/{1.73_m2} (ref >59)
Glucose: 101 mg/dL — ABNORMAL HIGH (ref 65–99)
Potassium: 4.8 mmol/L (ref 3.5–5.2)
SODIUM: 135 mmol/L (ref 134–144)

## 2018-10-09 LAB — CBC
Hematocrit: 44.6 % (ref 34.0–46.6)
Hemoglobin: 14.2 g/dL (ref 11.1–15.9)
MCH: 25 pg — AB (ref 26.6–33.0)
MCHC: 31.8 g/dL (ref 31.5–35.7)
MCV: 79 fL (ref 79–97)
PLATELETS: 289 10*3/uL (ref 150–450)
RBC: 5.68 x10E6/uL — AB (ref 3.77–5.28)
RDW: 14.8 % (ref 11.7–15.4)
WBC: 4.9 10*3/uL (ref 3.4–10.8)

## 2018-10-09 MED ORDER — NITROGLYCERIN 0.4 MG SL SUBL: 0.4000 mg | SUBLINGUAL_TABLET | SUBLINGUAL | 3 refills | Status: DC | PRN

## 2018-10-09 NOTE — Patient Instructions (Signed)
Medication Instructions:  none If you need a refill on your cardiac medications before your next appointment, please call your pharmacy.   Lab work:TODAY CBC BMET If you have labs (blood work) drawn today and your tests are completely normal, you will receive your results only by: [name] MyChart Message (if you have MyChart) OR . A paper copy in the mail If you have any lab test that is abnormal or we need to change your treatment, we will call you to review the results.  Testing/Procedures: Your physician has recommended that you have a pacemaker inserted. A pacemaker is a small device that is placed under the skin of your chest or abdomen to help control abnormal heart rhythms. This device uses electrical pulses to prompt the heart to beat at a normal rate. Pacemakers are used to treat heart rhythms that are too slow. Wire (leads) are attached to the pacemaker that goes into the chambers of you heart. This is done in the hospital and usually requires and overnight stay. Please see the instruction sheet given to you today for more information.    Follow-Up: 10-14 DAYS POST 10/22/18 WOUND CHECK                      91 DAYS POST 10/22/18 Dr Johney Frame At [facility], you and your health needs are our priority.  As part of our continuing mission to provide you with exceptional heart care, we have created designated Provider Care Teams.  These Care Teams include your primary Cardiologist (physician) and Advanced Practice Providers (APPs -  Physician Assistants and Nurse Practitioners) who all work together to provide you with the care you need, when you need it. .   Any Other Special Instructions Will Be Listed Below (If Applicable).  Please arrive at [facility] Entr[facility] of [facility] at 1:30 pm 10/22/18 Do not eat or drink after midnight the night prior to the procedure Do not take any medications the morning of the test TAKE LAST DOSE OF ELIQUIS 10/21/18 IN THE EVENING Plan for  one night stay Will need someone to drive you home at discharge

## 2018-10-11 ENCOUNTER — Telehealth: Payer: Self-pay

## 2018-10-11 LAB — CUP PACEART INCLINIC DEVICE CHECK
Date Time Interrogation Session: 20200214094809
Implantable Pulse Generator Implant Date: 20160309

## 2018-10-11 NOTE — Telephone Encounter (Signed)
-----   Message from Marily Lente, NP sent at 10/11/2018  8:30 AM EST ----- Please notify patient of stable labs. Thanks!

## 2018-10-11 NOTE — Telephone Encounter (Signed)
Notes recorded by Sigurd Sos, RN on 10/11/2018 at 12:43 PM EST Lpm that labs are stable and that if she has questions, she may call. ------

## 2018-10-15 ENCOUNTER — Telehealth: Payer: Self-pay

## 2018-10-15 NOTE — Telephone Encounter (Signed)
Patient called on 2/18 and would like to talk to Dr Jenel Lucks nurse about cancelling upcoming Pacemaker Implant 2/25.  Please call patient.  Thank you.

## 2018-10-16 NOTE — Telephone Encounter (Signed)
I talked with patient. She has thought more about pacemaker implant and with lack of symptoms right now has decided to hold off which I think is reasonable. We reviewed symptoms for which to call us/go to the ER. I have cancelled wound check appt for march - pt aware. I left follow up with Dr Johney Frame in May to follow up on symptoms. [facility]    [name], NP 10/16/2018 9:43 AM

## 2018-10-16 NOTE — Telephone Encounter (Signed)
Spoke to patient and she would like to cancel her upcoming Pacemaker Implant 2/25.  She will call us when she would like to have it done.  She seems to be apprehensive about having it done.

## 2018-10-20 ENCOUNTER — Other Ambulatory Visit: Payer: Self-pay

## 2018-10-20 ENCOUNTER — Observation Stay (HOSPITAL_COMMUNITY)
Admission: EM | Admit: 2018-10-20 | Discharge: 2018-10-22 | Disposition: A | Discharge status: Home / Self Care | Payer: Medicare Other | Attending: Internal Medicine | Admitting: Internal Medicine

## 2018-10-20 ENCOUNTER — Encounter (HOSPITAL_COMMUNITY): Payer: Self-pay

## 2018-10-20 ENCOUNTER — Emergency Department (HOSPITAL_COMMUNITY): Payer: Medicare Other

## 2018-10-20 DIAGNOSIS — M069 Rheumatoid arthritis, unspecified: Secondary | ICD-10-CM | POA: Insufficient documentation

## 2018-10-20 DIAGNOSIS — Z87891 Personal history of nicotine dependence: Secondary | ICD-10-CM | POA: Diagnosis not present

## 2018-10-20 DIAGNOSIS — A4151 Sepsis due to Escherichia coli [E. coli]: Principal | ICD-10-CM | POA: Insufficient documentation

## 2018-10-20 DIAGNOSIS — N10 Acute tubulo-interstitial nephritis: Secondary | ICD-10-CM | POA: Diagnosis not present

## 2018-10-20 DIAGNOSIS — F419 Anxiety disorder, unspecified: Secondary | ICD-10-CM | POA: Diagnosis not present

## 2018-10-20 DIAGNOSIS — K219 Gastro-esophageal reflux disease without esophagitis: Secondary | ICD-10-CM | POA: Diagnosis not present

## 2018-10-20 DIAGNOSIS — Z950 Presence of cardiac pacemaker: Secondary | ICD-10-CM | POA: Insufficient documentation

## 2018-10-20 DIAGNOSIS — I700 Atherosclerosis of aorta: Secondary | ICD-10-CM | POA: Diagnosis not present

## 2018-10-20 DIAGNOSIS — Z8673 Personal history of transient ischemic attack (TIA), and cerebral infarction without residual deficits: Secondary | ICD-10-CM | POA: Diagnosis not present

## 2018-10-20 DIAGNOSIS — A419 Sepsis, unspecified organism: Secondary | ICD-10-CM | POA: Diagnosis present

## 2018-10-20 DIAGNOSIS — M329 Systemic lupus erythematosus, unspecified: Secondary | ICD-10-CM | POA: Insufficient documentation

## 2018-10-20 DIAGNOSIS — I152 Hypertension secondary to endocrine disorders: Secondary | ICD-10-CM | POA: Diagnosis present

## 2018-10-20 DIAGNOSIS — I252 Old myocardial infarction: Secondary | ICD-10-CM | POA: Diagnosis not present

## 2018-10-20 DIAGNOSIS — N179 Acute kidney failure, unspecified: Secondary | ICD-10-CM | POA: Diagnosis present

## 2018-10-20 DIAGNOSIS — I10 Essential (primary) hypertension: Secondary | ICD-10-CM | POA: Diagnosis not present

## 2018-10-20 DIAGNOSIS — Q0700 Arnold-Chiari syndrome without spina bifida or hydrocephalus: Secondary | ICD-10-CM | POA: Diagnosis not present

## 2018-10-20 DIAGNOSIS — I129 Hypertensive chronic kidney disease with stage 1 through stage 4 chronic kidney disease, or unspecified chronic kidney disease: Secondary | ICD-10-CM | POA: Insufficient documentation

## 2018-10-20 DIAGNOSIS — F329 Major depressive disorder, single episode, unspecified: Secondary | ICD-10-CM | POA: Diagnosis not present

## 2018-10-20 DIAGNOSIS — Z8249 Family history of ischemic heart disease and other diseases of the circulatory system: Secondary | ICD-10-CM | POA: Insufficient documentation

## 2018-10-20 DIAGNOSIS — Z955 Presence of coronary angioplasty implant and graft: Secondary | ICD-10-CM | POA: Insufficient documentation

## 2018-10-20 DIAGNOSIS — Z79899 Other long term (current) drug therapy: Secondary | ICD-10-CM | POA: Insufficient documentation

## 2018-10-20 DIAGNOSIS — R945 Abnormal results of liver function studies: Secondary | ICD-10-CM | POA: Insufficient documentation

## 2018-10-20 DIAGNOSIS — E785 Hyperlipidemia, unspecified: Secondary | ICD-10-CM | POA: Insufficient documentation

## 2018-10-20 DIAGNOSIS — N182 Chronic kidney disease, stage 2 (mild): Secondary | ICD-10-CM | POA: Insufficient documentation

## 2018-10-20 DIAGNOSIS — I480 Paroxysmal atrial fibrillation: Secondary | ICD-10-CM | POA: Diagnosis present

## 2018-10-20 DIAGNOSIS — Z7901 Long term (current) use of anticoagulants: Secondary | ICD-10-CM | POA: Diagnosis not present

## 2018-10-20 DIAGNOSIS — Z951 Presence of aortocoronary bypass graft: Secondary | ICD-10-CM | POA: Diagnosis not present

## 2018-10-20 DIAGNOSIS — I5181 Takotsubo syndrome: Secondary | ICD-10-CM | POA: Diagnosis not present

## 2018-10-20 DIAGNOSIS — I2510 Atherosclerotic heart disease of native coronary artery without angina pectoris: Secondary | ICD-10-CM | POA: Diagnosis present

## 2018-10-20 DIAGNOSIS — Z9861 Coronary angioplasty status: Secondary | ICD-10-CM

## 2018-10-20 LAB — COMPREHENSIVE METABOLIC PANEL
ALT: 9 U/L (ref 0–44)
AST: 20 U/L (ref 15–41)
Albumin: 3.2 g/dL — ABNORMAL LOW (ref 3.5–5.0)
Alkaline Phosphatase: 73 U/L (ref 38–126)
Anion gap: 11 (ref 5–15)
BUN: 12 mg/dL (ref 8–23)
CO2: 20 mmol/L — ABNORMAL LOW (ref 22–32)
Calcium: 9 mg/dL (ref 8.9–10.3)
Chloride: 100 mmol/L (ref 98–111)
Creatinine, Ser: 1.28 mg/dL — ABNORMAL HIGH (ref 0.44–1.00)
GFR calc Af Amer: 51 mL/min — ABNORMAL LOW (ref >60)
GFR calc non Af Amer: 44 mL/min — ABNORMAL LOW (ref >60)
Glucose, Bld: 115 mg/dL — ABNORMAL HIGH (ref 70–99)
Potassium: 4.2 mmol/L (ref 3.5–5.1)
Sodium: 131 mmol/L — ABNORMAL LOW (ref 135–145)
Total Bilirubin: 1.4 mg/dL — ABNORMAL HIGH (ref 0.3–1.2)
Total Protein: 8.8 g/dL — ABNORMAL HIGH (ref 6.5–8.1)

## 2018-10-20 LAB — CBC WITH DIFFERENTIAL/PLATELET
ABS IMMATURE GRANULOCYTES: 0.05 10*3/uL (ref 0.00–0.07)
Basophils Absolute: 0.1 10*3/uL (ref 0.0–0.1)
Basophils Relative: 1 %
Eosinophils Absolute: 0 10*3/uL (ref 0.0–0.5)
Eosinophils Relative: 0 %
HCT: 43.5 % (ref 36.0–46.0)
Hemoglobin: 13 g/dL (ref 12.0–15.0)
Immature Granulocytes: 1 %
LYMPHS ABS: 1.7 10*3/uL (ref 0.7–4.0)
Lymphocytes Relative: 17 %
MCH: 23.8 pg — AB (ref 26.0–34.0)
MCHC: 29.9 g/dL — ABNORMAL LOW (ref 30.0–36.0)
MCV: 79.7 fL — ABNORMAL LOW (ref 80.0–100.0)
Monocytes Absolute: 0.9 10*3/uL (ref 0.1–1.0)
Monocytes Relative: 8 %
Neutro Abs: 7.4 10*3/uL (ref 1.7–7.7)
Neutrophils Relative %: 73 %
Platelets: 338 10*3/uL (ref 150–400)
RBC: 5.46 MIL/uL — ABNORMAL HIGH (ref 3.87–5.11)
RDW: 15.5 % (ref 11.5–15.5)
WBC: 10.2 10*3/uL (ref 4.0–10.5)
nRBC: 0 % (ref 0.0–0.2)

## 2018-10-20 LAB — URINALYSIS, ROUTINE W REFLEX MICROSCOPIC
Bilirubin Urine: NEGATIVE
Glucose, UA: NEGATIVE mg/dL
Ketones, ur: 15 mg/dL — AB
Nitrite: POSITIVE — AB
PH: 6 (ref 5.0–8.0)
Protein, ur: 100 mg/dL — AB
Specific Gravity, Urine: 1.02 (ref 1.005–1.030)

## 2018-10-20 LAB — URINALYSIS, MICROSCOPIC (REFLEX): WBC, UA: >50 WBC/hpf (ref 0–5)

## 2018-10-20 LAB — LACTIC ACID, PLASMA
Lactic Acid, Venous: 1.7 mmol/L (ref 0.5–1.9)
Lactic Acid, Venous: 1.3 mmol/L (ref 0.5–1.9)

## 2018-10-20 LAB — PROCALCITONIN: Procalcitonin: 1 ng/mL

## 2018-10-20 LAB — BRAIN NATRIURETIC PEPTIDE: B Natriuretic Peptide: 87 pg/mL (ref 0.0–100.0)

## 2018-10-20 MED ORDER — APIXABAN 5 MG PO TABS
5.0000 mg | ORAL_TABLET | Freq: Two times a day (BID) | ORAL | Status: DC
  Administered 2018-10-20 – 2018-10-22 (×4): 5 mg via ORAL
  Filled 2018-10-20 (×4): qty 1

## 2018-10-20 MED ORDER — METOPROLOL TARTRATE 12.5 MG HALF TABLET
12.5000 mg | ORAL_TABLET | Freq: Two times a day (BID) | ORAL | Status: DC
  Administered 2018-10-20 – 2018-10-22 (×4): 12.5 mg via ORAL
  Filled 2018-10-20 (×4): qty 1

## 2018-10-20 MED ORDER — ACETAMINOPHEN 500 MG PO TABS
1000.0000 mg | ORAL_TABLET | Freq: Once | ORAL | Status: AC
  Administered 2018-10-20: 1000 mg via ORAL
  Filled 2018-10-20: qty 2

## 2018-10-20 MED ORDER — ATORVASTATIN CALCIUM 80 MG PO TABS
80.0000 mg | ORAL_TABLET | Freq: Every day | ORAL | Status: DC
  Administered 2018-10-21: 80 mg via ORAL
  Filled 2018-10-20 (×3): qty 1

## 2018-10-20 MED ORDER — NITROGLYCERIN 0.4 MG SL SUBL: 0.4000 mg | SUBLINGUAL_TABLET | SUBLINGUAL | Status: DC | PRN

## 2018-10-20 MED ORDER — ACETAMINOPHEN 650 MG RE SUPP: 650.0000 mg | Freq: Four times a day (QID) | RECTAL | Status: DC | PRN

## 2018-10-20 MED ORDER — LACTATED RINGERS IV BOLUS (SEPSIS)
250.0000 mL | Freq: Once | INTRAVENOUS | Status: AC
  Administered 2018-10-20: 250 mL via INTRAVENOUS

## 2018-10-20 MED ORDER — SODIUM CHLORIDE 0.9 % IV SOLN
1.0000 g | INTRAVENOUS | Status: DC
  Administered 2018-10-20 – 2018-10-21 (×2): 1 g via INTRAVENOUS
  Filled 2018-10-20 (×2): qty 10

## 2018-10-20 MED ORDER — LACTATED RINGERS IV BOLUS (SEPSIS)
500.0000 mL | Freq: Once | INTRAVENOUS | Status: AC
  Administered 2018-10-20: 500 mL via INTRAVENOUS

## 2018-10-20 MED ORDER — LACTATED RINGERS IV BOLUS (SEPSIS)
1000.0000 mL | Freq: Once | INTRAVENOUS | Status: AC
  Administered 2018-10-20: 1000 mL via INTRAVENOUS

## 2018-10-20 MED ORDER — ACETAMINOPHEN 325 MG PO TABS: 650.0000 mg | ORAL_TABLET | Freq: Four times a day (QID) | ORAL | Status: DC | PRN

## 2018-10-20 MED ORDER — SODIUM CHLORIDE 0.9 % IV BOLUS
500.0000 mL | Freq: Once | INTRAVENOUS | Status: AC
  Administered 2018-10-20: 500 mL via INTRAVENOUS

## 2018-10-20 MED ORDER — HYDRALAZINE HCL 20 MG/ML IJ SOLN: 5.0000 mg | INTRAMUSCULAR | Status: DC | PRN

## 2018-10-20 MED ORDER — IOHEXOL 300 MG/ML  SOLN
100.0000 mL | Freq: Once | INTRAMUSCULAR | Status: AC | PRN
  Administered 2018-10-20: 100 mL via INTRAVENOUS

## 2018-10-20 MED ORDER — SODIUM CHLORIDE 0.9 % IV SOLN
INTRAVENOUS | Status: DC
  Administered 2018-10-20 – 2018-10-22 (×2): via INTRAVENOUS

## 2018-10-20 MED ORDER — ONDANSETRON HCL 4 MG PO TABS: 4.0000 mg | ORAL_TABLET | Freq: Four times a day (QID) | ORAL | Status: DC | PRN

## 2018-10-20 MED ORDER — ONDANSETRON HCL 4 MG/2ML IJ SOLN: 4.0000 mg | Freq: Four times a day (QID) | INTRAMUSCULAR | Status: DC | PRN

## 2018-10-20 NOTE — H&P (Signed)
History and Physical    Mercedes [name] JYN:[id number] DOB: [birth year] DOA: 10/20/2018  Referring MD/NP/PA:   PCP: Pincus Sanes, MD   Patient coming from:  The patient is coming from home.  At baseline, pt is independent for most of ADL.        Chief Complaint: Fever, chills, dysuria, lower abdominal pain, bilateral flank pain  HPI: Mercedes [name] is a 63 y.o. female with medical history significant of hypertension, hyperlipidemia, GERD, depression, anxiety, Arnold-Chiari malformation type I, CAD, CABG/stent placement, PAF on Eliquis, PAD, SLE, RA, Sjogren's disease, Takotsubo cardiomyopathy, tachybradycardia syndrome (s/p of pacemaker placement), CKD 2, who presents with fever, chills, dysuria, lower abdominal pain, bilateral flank pain.  Patient states that she has been feeling bad for more than 3 days, symptoms include fever, chills, dysuria, burning on urination, increased urinary frequency, lower abdominal pain and bilateral flank pain.  She has nausea, no vomiting or diarrhea.  The lower abdominal pain is constant, moderate, sharp, nonradiating.  Patient denies any chest pain, shortness breath, cough.  No unilateral weakness. Pt states that she feels so bad that she did not take her medications in past 2 days.  ED Course: pt was found to have positive urinalysis for UTI (turbid experience, large amount of leukocyte, positive nitrite, many bacteria, WBC>50), WBC 10.2, worsening renal function, temperature 100.4, initially tachycardia with heart rate 92-->57, initially tachypnea with RR 23--> 15, oxygen saturation 96% on room air, negative chest x-ray.  CT abdomen/pelvis showed evidence suggestive urinary infection and possible pyelonephritis.  Patient is placed on telemetry bed for observation.  Review of Systems:   General: has fevers, chills, no body weight gain, has poor appetite, has fatigue HEENT: no blurry vision, hearing changes or sore throat Respiratory: no dyspnea,  coughing, wheezing CV: no chest pain, no palpitations GI: has nausea, lower abdominal pain, no diarrhea, constipation, vomiting, GU: has dysuria, burning on urination, increased urinary frequency, no hematuria  Ext: no leg edema Neuro: no unilateral weakness, numbness, or tingling, no vision change or hearing loss Skin: no rash, no skin tear. MSK: No muscle spasm, no deformity, no limitation of range of movement in spin. Has bilateral flank pain. Heme: No easy bruising.  Travel history: No recent long distant travel.  Allergy:  Allergies  Allergen Reactions  . Brilinta [Ticagrelor] Anaphylaxis and Other (See Comments)    Also, chest tightness  . Latex Rash    Past Medical History:  Diagnosis Date  . Abnormal LFTs    a. in the past when on Lipitor  . Anemia    as a young woman  . Anxiety   . Arnold-Chiari malformation, type I (HCC)    MRI brain 02/2010  . CAD (coronary artery disease)    a. NSTEMI 07/2009: LHC - D2 40%, LAD irreg., EF 50% with apical AK (tako-tsubo CM);  b. Inf STEMI (04/2013): Tx Promus DES to CFX;  c. 08/2013 Lexi CL: No ischemia, dist ant/ap/inf-ap infarct, EF  d. 06/2016 NSTEMI with LM disease: s/p CABG on 07/17/2016 w/ LIMA-LAD, SVG-OM, and SVG-dRCA.  [name] DEPRESSION   . Dizziness    ? CVA 01/2010 - carotid dopplers with no ICA stenosis  . GERD (gastroesophageal reflux disease)   . HYPERLIPIDEMIA   . HYPERTENSION   . MI (myocardial infarction) (HCC)   . PAD (peripheral artery disease) (HCC)    s/p L fem pop 11/2013 in setting of 1st toe osteo/gangrene  . Paroxysmal atrial fibrillation (HCC)    a. anticoagulated  with Eliquis 10/2014  . RA (rheumatoid arthritis) (HCC)    prior tx by Dr. Dareen PianoAnderson  . Sjogren's syndrome (HCC) 06/02/13   pt denies this (12/01/13)  . SLE (systemic lupus erythematosus) (HCC) dx 05/2013   follows with rheum Dareen Piano([facility])  . Tachy-brady syndrome (HCC)    a. s/p STJ Leadless pacemaker 11-04-2014 Dr Johney FrameAllred  . Takotsubo cardiomyopathy 12.2010     f/u echo 09/2009: EF 50-55%, mild LVH, mod diast dysfxn, mild apical HK    Past Surgical History:  Procedure Laterality Date  . ABDOMINAL AORTAGRAM N/A 11/24/2013   Procedure: ABDOMINAL AORTAGRAM WITH BILATERAL RUNOFF WITH POSSIBLE INTERVENTION;  Surgeon: Chuck Hinthristopher S Dickson, MD;  [facility];  Service: Cardiovascular;  Laterality: N/A;  . AMPUTATION Left 12/02/2013   Procedure: LEFT 1ST TOE AMPUTATION;  Surgeon: Sherren Kernsharles E Fields, MD;  [facility];  Service: Vascular;  Laterality: Left;  . CARDIAC CATHETERIZATION N/A 07/12/2016   Procedure: Left Heart Cath and Coronary Angiography;  Surgeon: Lennette Biharihomas A Kelly, MD;  [facility];  Service: Cardiovascular;  Laterality: N/A;  . CARDIAC CATHETERIZATION N/A 07/14/2016   Procedure: Intravascular Ultrasound/IVUS;  Surgeon: Yvonne Kendallhristopher End, MD;  Location: [facility] INVASIVE CV LAB;  Service: Cardiovascular;  Laterality: N/A;  . COLONOSCOPY    . CORONARY ANGIOPLASTY  04/2013  . CORONARY ARTERY BYPASS GRAFT N/A 07/17/2016   Procedure: CORONARY ARTERY BYPASS GRAFTING (CABG)x3 using right greater saphenous vein harvested endoscopiclly and left internal mammary artery;  Surgeon: Kerin PernaPeter Van Trigt, MD;  [facility];  Service: Open Heart Surgery;  Laterality: N/A;  . FEMORAL-POPLITEAL BYPASS GRAFT Left 12/02/2013   Procedure:  LEFT FEMORAL-BELOW KNEE POPLITEAL ARTERY BYPASS GRAFT;  Surgeon: Sherren Kernsharles E Fields, MD;  [facility];  Service: Vascular;  Laterality: Left;  . LEFT HEART CATHETERIZATION WITH CORONARY ANGIOGRAM N/A 05/19/2013   Procedure: LEFT HEART CATHETERIZATION WITH CORONARY ANGIOGRAM;  Surgeon: Micheline ChapmanMichael D Cooper, MD;  [facility];  Service: Cardiovascular;  Laterality: N/A;  . LEFT HEART CATHETERIZATION WITH CORONARY ANGIOGRAM N/A 10/28/2014   Procedure: LEFT HEART CATHETERIZATION WITH CORONARY ANGIOGRAM;  Surgeon: Iran OuchMuhammad A Arida, MD;  Location: [facility] CATH LAB;  Service: Cardiovascular;  Laterality: N/A;  .  PERCUTANEOUS CORONARY STENT INTERVENTION (PCI-S)  05/19/2013   Procedure: PERCUTANEOUS CORONARY STENT INTERVENTION (PCI-S);  Surgeon: Micheline ChapmanMichael D Cooper, MD;  [facility];  Service: Cardiovascular;;  . PERMANENT PACEMAKER INSERTION N/A 11/04/2014   STJ Leadless pacemaker implanted by Dr Johney FrameAllred for tachy/brady  . TEE WITHOUT CARDIOVERSION N/A 07/17/2016   Procedure: TRANSESOPHAGEAL ECHOCARDIOGRAM (TEE);  Surgeon: Kerin PernaPeter Van Trigt, MD;  [facility];  Service: Open Heart Surgery;  Laterality: N/A;  . TUBAL LIGATION      Social History:  reports that she quit smoking about 2 years ago. Her smoking use included cigarettes. She has never used smokeless tobacco. She reports that she does not drink alcohol or use drugs.  Family History:  Family History  Problem Relation Age of Onset  . Arthritis Mother   . Emphysema Mother   . Arthritis Father   . COPD Father   . Heart disease Father   . Diabetes Paternal Aunt        paternal Aunts  . Heart disease Paternal Aunt   . Thyroid disease Neg Hx      Prior to Admission medications   Medication Sig Start Date End Date Taking? Authorizing Provider  ELIQUIS 5 MG TABS tablet TAKE 1 TABLET BY MOUTH TWICE DAILY  Patient taking differently: Take 5 mg by mouth 2 (two) times daily.  04/24/18  Yes Lewayne Bunting, MD  lisinopril (PRINIVIL,ZESTRIL) 5 MG tablet TAKE 1 TABLET(5 MG) BY MOUTH DAILY. FOLLOW UP APPOINTMENT FOR FURTHER REFILLS Patient taking differently: Take 5 mg by mouth daily as needed (if Systolic reading is 180 or greater).  04/24/18  Yes Lewayne Bunting, MD  nitroGLYCERIN (NITROSTAT) 0.4 MG SL tablet Place 1 tablet (0.4 mg total) under the tongue every 5 (five) minutes as needed for chest pain. 10/09/18  Yes Seiler, Amber K, NP  atorvastatin (LIPITOR) 80 MG tablet Take 1 tablet (80 mg total) by mouth daily. Patient not taking: Reported on 10/20/2018 03/01/18 10/20/18  Lewayne Bunting, MD  metoprolol tartrate (LOPRESSOR) 25 MG tablet  Take 0.5 tablets (12.5 mg total) by mouth 2 (two) times daily. Patient not taking: Reported on 10/20/2018 04/24/18   Lewayne Bunting, MD    Physical Exam: Vitals:   10/20/18 1900 10/20/18 1915 10/20/18 1930 10/20/18 1945  BP: 120/63 137/65 130/62 129/72  Pulse: (!) 57 (!) 58 (!) 55 (!) 54  Resp: 15 15 16 13   Temp:      TempSrc:      SpO2: 97% 96% 95% 94%  Weight:      Height:       General: Not in acute distress HEENT:       Eyes: PERRL, EOMI, no scleral icterus.       ENT: No discharge from the ears and nose, no pharynx injection, no tonsillar enlargement.        Neck: No JVD, no bruit, no mass felt. Heme: No neck lymph node enlargement. Cardiac: S1/S2, RRR, No murmurs, No gallops or rubs. Respiratory:  No rales, wheezing, rhonchi or rubs. GI: Soft, nondistended, has tenderness in the suprapubic area, no rebound pain, no organomegaly, BS present.  Positive right CVA tenderness. GU: No hematuria Ext: No pitting leg edema bilaterally. 2+DP/PT pulse bilaterally. Musculoskeletal: No joint deformities, No joint redness or warmth, no limitation of ROM in spin. Skin: No rashes.  Neuro: Alert, oriented X3, cranial nerves II-XII grossly intact, moves all extremities normally.  Psych: Patient is not psychotic, no suicidal or hemocidal ideation.  Labs on Admission: I have personally reviewed following labs and imaging studies  CBC: Recent Labs  Lab 10/20/18 1639  WBC 10.2  NEUTROABS 7.4  HGB 13.0  HCT 43.5  MCV 79.7*  PLT 338   Basic Metabolic Panel: Recent Labs  Lab 10/20/18 1639  NA 131*  K 4.2  CL 100  CO2 20*  GLUCOSE 115*  BUN 12  CREATININE 1.28*  CALCIUM 9.0   GFR: Estimated Creatinine Clearance: 38.5 mL/min (A) (by C-G formula based on SCr of 1.28 mg/dL (H)). Liver Function Tests: Recent Labs  Lab 10/20/18 1639  AST 20  ALT 9  ALKPHOS 73  BILITOT 1.4*  PROT 8.8*  ALBUMIN 3.2*   No results for input(s): LIPASE, AMYLASE in the last 168 hours. No  results for input(s): AMMONIA in the last 168 hours. Coagulation Profile: No results for input(s): INR, PROTIME in the last 168 hours. Cardiac Enzymes: No results for input(s): CKTOTAL, CKMB, CKMBINDEX, TROPONINI in the last 168 hours. BNP (last 3 results) No results for input(s): PROBNP in the last 8760 hours. HbA1C: No results for input(s): HGBA1C in the last 72 hours. CBG: No results for input(s): GLUCAP in the last 168 hours. Lipid Profile: No results for input(s): CHOL, HDL, LDLCALC, TRIG, CHOLHDL, LDLDIRECT  in the last 72 hours. Thyroid Function Tests: No results for input(s): TSH, T4TOTAL, FREET4, T3FREE, THYROIDAB in the last 72 hours. Anemia Panel: No results for input(s): VITAMINB12, FOLATE, FERRITIN, TIBC, IRON, RETICCTPCT in the last 72 hours. Urine analysis:    Component Value Date/Time   COLORURINE YELLOW 10/20/2018 1710   APPEARANCEUR TURBID (A) 10/20/2018 1710   LABSPEC 1.020 10/20/2018 1710   PHURINE 6.0 10/20/2018 1710   GLUCOSEU NEGATIVE 10/20/2018 1710   HGBUR MODERATE (A) 10/20/2018 1710   BILIRUBINUR NEGATIVE 10/20/2018 1710   KETONESUR 15 (A) 10/20/2018 1710   PROTEINUR 100 (A) 10/20/2018 1710   UROBILINOGEN 1.0 11/06/2014 1508   NITRITE POSITIVE (A) 10/20/2018 1710   LEUKOCYTESUR LARGE (A) 10/20/2018 1710   Sepsis Labs: @LABRCNTIP (procalcitonin:4,lacticidven:4) )No results found for this or any previous visit (from the past 240 hour(s)).   Radiological Exams on Admission: Ct Abdomen Pelvis W Contrast  Result Date: 10/20/2018 CLINICAL DATA:  Fever and abdominal pain. EXAM: CT ABDOMEN AND PELVIS WITH CONTRAST TECHNIQUE: Multidetector CT imaging of the abdomen and pelvis was performed using the standard protocol following bolus administration of intravenous contrast. CONTRAST:  OMNIPAQUE IOHEXOL 300 MG/ML  SOLN COMPARISON:  None. FINDINGS: Lower chest: Unremarkable. Hepatobiliary: Small area of low attenuation in the anterior liver, adjacent to the  falciform ligament, is in a characteristic location for focal fatty deposition. 4 mm hypodensity in the inferior right liver is too small to characterize but likely a tiny cyst. There is no evidence for gallstones, gallbladder wall thickening, or pericholecystic fluid. Trace intrahepatic biliary duct prominence noted. Extrahepatic common bile duct measures 8 mm in the head of pancreas. Pancreas: No focal mass lesion. No dilatation of the main duct. No intraparenchymal cyst. No peripancreatic edema. Spleen: No splenomegaly. No focal mass lesion. Adrenals/Urinary Tract: No adrenal nodule or mass. No suspicious mass lesion identified in either kidney. Delayed imaging shows circumferential wall thickening in the right renal pelvis and proximal right ureter. Similar wall thickening is noted in the left renal pelvis. There is a small focus of hypoenhancement in the posterior interpolar left kidney on delayed imaging (8/8) probable some similar segmental edema in the upper pole of the right kidney on 08/08 as well., suggesting segmental edema. Circumferential bladder wall thickening evident. Stomach/Bowel: Stomach is unremarkable. No gastric wall thickening. No evidence of outlet obstruction. Duodenum is normally positioned as is the ligament of Treitz. No small bowel wall thickening. No small bowel dilatation. The terminal ileum is normal. The appendix is normal. No gross colonic mass. No colonic wall thickening. Vascular/Lymphatic: There is abdominal aortic atherosclerosis without aneurysm. There is no gastrohepatic or hepatoduodenal ligament lymphadenopathy. No intraperitoneal or retroperitoneal lymphadenopathy. No pelvic sidewall lymphadenopathy. Reproductive: Uterus unremarkable.  There is no adnexal mass. Other: No intraperitoneal free fluid. Musculoskeletal: No worrisome lytic or sclerotic osseous abnormality. IMPRESSION: 1. Fairly prominent wall thickening identified in each renal pelvis and proximal ureter on  delayed imaging. Early imaging suggests that there is distal ureteral wall thickening as well and circumferential wall thickening is clearly evident in the urinary bladder. Focal areas of apparent segmental edema are noted in both kidneys. Imaging features are highly suspicious for urinary tract infection with bilateral pyelonephritis. While urothelial neoplasm can present with wall thickening in the renal pelvis or ureter, given the bilateral involvement and clinical history of fever and back pain, urothelial neoplasm is considered unlikely. 2.  Aortic Atherosclerois (ICD10-170.0) 3. Mild intra and extrahepatic biliary duct dilatation. Correlation with liver function test may prove  helpful. Electronically Signed   By: Kennith CenterEric  Mansell M.D.   On: 10/20/2018 19:12   Dg Chest Port 1 View  Result Date: 10/20/2018 CLINICAL DATA:  Fever and abdominal pain since 10/17/2018. EXAM: PORTABLE CHEST 1 VIEW COMPARISON:  PA and lateral chest 08/30/2017 and 10/04/2015. FINDINGS: Lungs are clear. Heart size is normal. The patient is status post CABG with a loop recorder in place. No pneumothorax or pleural effusion. Aortic atherosclerosis noted. No acute or focal bony abnormality. IMPRESSION: No acute disease. Atherosclerosis. Electronically Signed   By: Drusilla Kannerhomas  Dalessio M.D.   On: 10/20/2018 17:13     EKG: Independently reviewed.  Sinus rhythm, QTC 412, LAD, LAE, PAC   Assessment/Plan Principal Problem:   Acute pyelonephritis Active Problems:   Essential hypertension   CORONARY ATHEROSCLEROSIS NATIVE CORONARY ARTERY   Paroxysmal atrial fibrillation (HCC)   Hx of CABG   Acute renal failure superimposed on stage 2 chronic kidney disease (HCC)   Sepsis (HCC)   Sepsis due to acute pyelonephritis: Patient has a typical urinary tract infection symptoms, bilateral flank pain, positive right CVA tenderness, positive urinalysis and CT findings suggestive of pyelonephritis.  Patient meets critical for sepsis with fever,  initially tachycardia and tachypnea.  Currently hemodynamically stable.  -  Place on telel bed for obs -  Ceftriaxone by IV - Follow up results of urine and blood cx and amend antibiotic regimen if needed per sensitivity results - prn Zofran for nausea - will get Procalcitonin and trend lactic acid levels per sepsis protocol. - IVF: 2.25 L of of ringer solution in ED. Will give another 500 cc NS and then followed by 100 cc/h  Essential hypertension: -hold home lisinopril since patient at high risk of developing hypotension due to sepsis -Continue metoprolol with holding parameters for HR<65 and SBP<95 -IV hydralazine PRN  Hx of CORONARY ATHEROSCLEROSIS NATIVE CORONARY ARTERY: s/p of CABG and Stent placement. No CP or SOB. -continue Lipitor and as needed nitroglycerin  Paroxysmal atrial fibrillation [facility](HCC): CHA2DS2-VASc Score is 3-4, needs oral anticoagulation. Patient is on Eliquis at home. Heart rate is well controlled. Pt did not take her Eliquis and metoprolol in the past 2 days. -Continue metoprolol and Eliquis  Acute renal failure superimposed on stage 2 chronic kidney disease (HCC): Baseline creatinine 0.94 on 10/09/2018.  Her creatinine is 1.28, BUN 12, likely due to UTI and continuation of lisinopril -IV fluid as above - Hold lisinopril -Follow-up by BMP   DVT ppx: on eliquis Code Status: Full code Family Communication: None at bed side.  Disposition Plan:  Anticipate discharge back to previous home environment Consults called:  none Admission status: Obs / tele    Date of Service 10/20/2018    Lorretta HarpXilin Shaden Lacher [facility]   If 7PM-7AM, please contact night-coverage [email or web address] Password [email or web address] 10/20/2018, 8:05 PM

## 2018-10-20 NOTE — ED Triage Notes (Signed)
Pt arrives from home with c/o fever and abdominal pain and back pain since Thursday. No vomiting and no bm since Thursday. Water only intake since Thursday after eating chilli

## 2018-10-20 NOTE — ED Provider Notes (Signed)
[facility] EMERGENCY DEPARTMENT Provider Note   CSN: [id number] Arrival date & time: 10/20/18  1602    History   Chief Complaint Chief Complaint  Patient presents with  . Fever  . Abdominal Pain  . Back Pain    HPI Mercedes [name] is a 63 y.o. female.      Fever  Max temp prior to arrival:  102 Temp source:  Oral Severity:  Moderate Onset quality:  Gradual Duration:  3 days Timing:  Constant Progression:  Worsening Chronicity:  New Relieved by:  None tried Worsened by:  Nothing Ineffective treatments:  None tried Associated symptoms: chills, dysuria and nausea   Associated symptoms: no chest pain, no confusion, no congestion, no cough, no diarrhea, no ear pain, no headaches, no myalgias, no rash, no rhinorrhea, no somnolence, no sore throat and no vomiting   Risk factors: no recent sickness and no sick contacts   Abdominal Pain  Pain location:  R flank Pain radiates to:  Does not radiate Pain severity:  No pain Onset quality:  Sudden Timing:  Constant Progression:  Worsening Chronicity:  New Context: not medication withdrawal and not sick contacts   Relieved by:  None tried Worsened by:  Nothing Ineffective treatments:  None tried Associated symptoms: chills, dysuria, fever and nausea   Associated symptoms: no chest pain, no cough, no diarrhea, no hematuria, no shortness of breath, no sore throat and no vomiting   Back Pain  Associated symptoms: abdominal pain, dysuria and fever   Associated symptoms: no chest pain and no headaches     Past Medical History:  Diagnosis Date  . Abnormal LFTs    a. in the past when on Lipitor  . Anemia    as a young woman  . Anxiety   . Arnold-Chiari malformation, type I (HCC)    MRI brain 02/2010  . CAD (coronary artery disease)    a. NSTEMI 07/2009: LHC - D2 40%, LAD irreg., EF 50% with apical AK (tako-tsubo CM);  b. Inf STEMI (04/2013): Tx Promus DES to CFX;  c. 08/2013 Lexi CL: No ischemia, dist  ant/ap/inf-ap infarct, EF  d. 06/2016 NSTEMI with LM disease: s/p CABG on 07/17/2016 w/ LIMA-LAD, SVG-OM, and SVG-dRCA.  [name] DEPRESSION   . Dizziness    ? CVA 01/2010 - carotid dopplers with no ICA stenosis  . GERD (gastroesophageal reflux disease)   . HYPERLIPIDEMIA   . HYPERTENSION   . MI (myocardial infarction) (HCC)   . PAD (peripheral artery disease) (HCC)    s/p L fem pop 11/2013 in setting of 1st toe osteo/gangrene  . Paroxysmal atrial fibrillation (HCC)    a. anticoagulated with Eliquis 10/2014  . RA (rheumatoid arthritis) (HCC)    prior tx by Dr. Dareen Piano  . Sjogren's syndrome (HCC) 06/02/13   pt denies this (12/01/13)  . SLE (systemic lupus erythematosus) (HCC) dx 05/2013   follows with rheum Dareen Piano)  . Tachy-brady syndrome (HCC)    a. s/p STJ Leadless pacemaker 11-04-2014 Dr Johney Frame  . 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 Acute pyelonephritis 10/20/2018  . Acute renal failure superimposed on stage 2 chronic kidney disease (HCC) 10/20/2018  . Sepsis (HCC) 10/20/2018  . Diabetes (HCC) 04/01/2017  . Hyperthyroidism 02/21/2017  . Low mean corpuscular volume (MCV) 09/30/2016  . Coronary artery disease involving native heart without  angina pectoris   . Hx of CABG 07/17/2016  . Paroxysmal atrial fibrillation (HCC) 07/12/2016  . Chest pain 07/11/2016  . NSTEMI (non-ST elevated myocardial infarction) (HCC) 07/11/2016  . History of arterial bypass of lower extremity 12/30/2015  . Claudication of right lower extremity (HCC) 12/30/2015  . Phantom limb pain-Left great toe 04/29/2015  . Seizure (HCC)   . Atrial flutter with rapid ventricular response vs. SVT 10/27/2014    Class: Acute  . 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 PAD (peripheral artery disease) (HCC) 12/02/2013  . Unstable angina-jaw pain  09/03/2013  . SLE (systemic lupus erythematosus) (HCC)   . CAD - S/P Takotsubo MI in 2000, CFX DES Sept 2014 05/25/2013  . Acute MI, inferior wall (HCC) 05/19/2013  . 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 Tachycardia-bradycardia (HCC) 03/09/2010  . PVD- s/p Lt [facility] April 2015 10/29/2009  . Dyslipidemia 08/26/2009  . Essential hypertension 08/26/2009  . CORONARY ATHEROSCLEROSIS NATIVE CORONARY ARTERY 08/26/2009  . 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 Rheumatoid arthritis (HCC) 08/26/2009    Past Surgical History:  Procedure Laterality Date  . ABDOMINAL AORTAGRAM N/A 11/24/2013   Procedure: ABDOMINAL AORTAGRAM WITH BILATERAL RUNOFF WITH POSSIBLE INTERVENTION;  Surgeon: Chuck Hint, MD;  Location: [facility] CATH LAB;  Service: Cardiovascular;  Laterality: N/A;  . AMPUTATION Left 12/02/2013   Procedure: LEFT 1ST TOE AMPUTATION;  Surgeon: Sherren Kerns, MD;  Location: [facility] OR;  Service: Vascular;  Laterality: Left;  . CARDIAC CATHETERIZATION N/A 07/12/2016   Procedure: Left Heart Cath and Coronary Angiography;  Surgeon: Lennette Bihari, MD;  Location: [facility] INVASIVE CV LAB;  Service: Cardiovascular;  Laterality: N/A;  . CARDIAC CATHETERIZATION N/A 07/14/2016   Procedure: Intravascular Ultrasound/IVUS;  Surgeon: Yvonne Kendall, MD;  Location: [facility] INVASIVE CV LAB;  Service: Cardiovascular;  Laterality: N/A;  . COLONOSCOPY    . CORONARY ANGIOPLASTY  04/2013  . CORONARY ARTERY BYPASS GRAFT N/A 07/17/2016   Procedure: CORONARY ARTERY BYPASS GRAFTING (CABG)x3 using right greater saphenous vein harvested endoscopiclly and left internal mammary artery;  Surgeon: Kerin Perna, MD;  Location: [facility] OR;  Service: Open Heart Surgery;  Laterality: N/A;  . FEMORAL-POPLITEAL BYPASS GRAFT Left 12/02/2013   Procedure:  LEFT FEMORAL-BELOW KNEE POPLITEAL ARTERY BYPASS GRAFT;  Surgeon: Sherren Kerns, MD;  Location: [facility] OR;  Service: Vascular;  Laterality: Left;  . LEFT HEART CATHETERIZATION WITH CORONARY  ANGIOGRAM N/A 05/19/2013   Procedure: LEFT HEART CATHETERIZATION WITH CORONARY ANGIOGRAM;  Surgeon: Micheline Chapman, MD;  Location: [facility] CATH LAB;  Service: Cardiovascular;  Laterality: N/A;  . LEFT HEART CATHETERIZATION WITH CORONARY ANGIOGRAM N/A 10/28/2014   Procedure: LEFT HEART CATHETERIZATION WITH CORONARY ANGIOGRAM;  Surgeon: Iran Ouch, MD;  Location: [facility] CATH LAB;  Service: Cardiovascular;  Laterality: N/A;  . PERCUTANEOUS CORONARY STENT INTERVENTION (PCI-S)  05/19/2013   Procedure: PERCUTANEOUS CORONARY STENT INTERVENTION (PCI-S);  Surgeon: Micheline Chapman, MD;  Location: [facility] CATH LAB;  Service: Cardiovascular;;  . PERMANENT PACEMAKER INSERTION N/A 11/04/2014   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  . TEE WITHOUT CARDIOVERSION N/A 07/17/2016   Procedure: TRANSESOPHAGEAL ECHOCARDIOGRAM (TEE);  Surgeon: Kerin Perna, MD;  Location: [facility] OR;  Service: Open Heart Surgery;  Laterality: N/A;  . TUBAL LIGATION       OB History   No obstetric history on file.      Home Medications    Prior to Admission medications   Medication Sig Start Date  End Date Taking? Authorizing Provider  ELIQUIS 5 MG TABS tablet TAKE 1 TABLET BY MOUTH TWICE DAILY Patient taking differently: Take 5 mg by mouth 2 (two) times daily.  04/24/18  Yes Lewayne Bunting, MD  lisinopril (PRINIVIL,ZESTRIL) 5 MG tablet TAKE 1 TABLET(5 MG) BY MOUTH DAILY. FOLLOW UP APPOINTMENT FOR FURTHER REFILLS Patient taking differently: Take 5 mg by mouth daily as needed (if Systolic reading is 180 or greater).  04/24/18  Yes Lewayne Bunting, MD  nitroGLYCERIN (NITROSTAT) 0.4 MG SL tablet Place 1 tablet (0.4 mg total) under the tongue every 5 (five) minutes as needed for chest pain. 10/09/18  Yes Seiler, Amber K, NP  atorvastatin (LIPITOR) 80 MG tablet Take 1 tablet (80 mg total) by mouth daily. Patient not taking: Reported on 10/20/2018 03/01/18 10/20/18  Lewayne Bunting, MD  metoprolol tartrate (LOPRESSOR) 25 MG  tablet Take 0.5 tablets (12.5 mg total) by mouth 2 (two) times daily. Patient not taking: Reported on 10/20/2018 04/24/18   Lewayne Bunting, MD    Family History Family History  Problem Relation Age of Onset  . Arthritis Mother   . Emphysema Mother   . Arthritis Father   . COPD Father   . Heart disease Father   . Diabetes Paternal Aunt        paternal Aunts  . Heart disease Paternal Aunt   . Thyroid disease Neg Hx     Social History Social History   Tobacco Use  . Smoking status: Former Smoker    Types: Cigarettes    Last attempt to quit: 05/19/2016    Years since quitting: 2.4  . Smokeless tobacco: Never Used  . Tobacco comment:    Substance Use Topics  . Alcohol use: No    Alcohol/week: 0.0 standard drinks    Frequency: Never  . Drug use: No     Allergies   Brilinta [ticagrelor] and Latex   Review of Systems Review of Systems  Constitutional: Positive for chills and fever.  HENT: Negative for congestion, ear pain, rhinorrhea and sore throat.   Eyes: Negative for pain and visual disturbance.  Respiratory: Negative for cough and shortness of breath.   Cardiovascular: Negative for chest pain and palpitations.  Gastrointestinal: Positive for abdominal pain and nausea. Negative for diarrhea and vomiting.  Genitourinary: Positive for dysuria. Negative for hematuria.  Musculoskeletal: Positive for back pain. Negative for arthralgias and myalgias.  Skin: Negative for color change and rash.  Neurological: Negative for seizures, syncope and headaches.  Psychiatric/Behavioral: Negative for confusion.  All other systems reviewed and are negative.    Physical Exam Updated Vital Signs BP 129/72   Pulse (!) 54   Temp (!) 100.4 F (38 C) (Oral)   Resp 13   Ht 5\' 5"  (1.651 m)   Wt 54.9 kg   SpO2 94%   BMI 20.14 kg/m   Physical Exam Vitals signs and nursing note reviewed.  Constitutional:      General: She is not in acute distress.    Appearance: She is  well-developed.     Comments: Patient hemodynamically stable, resting comfortably.  Warm to touch.  HENT:     Head: Normocephalic and atraumatic.  Eyes:     Conjunctiva/sclera: Conjunctivae normal.  Neck:     Musculoskeletal: Neck supple.  Cardiovascular:     Rate and Rhythm: Normal rate and regular rhythm.     Heart sounds: No murmur.  Pulmonary:     Effort: Pulmonary effort is normal. No  respiratory distress.     Breath sounds: Normal breath sounds.  Abdominal:     Palpations: Abdomen is soft.     Tenderness: There is abdominal tenderness. There is right CVA tenderness.  Skin:    General: Skin is warm and dry.     Capillary Refill: Capillary refill takes less than 2 seconds.  Neurological:     General: No focal deficit present.     Mental Status: She is alert.  Psychiatric:        Mood and Affect: Mood normal.      ED Treatments / Results  Labs (all labs ordered are listed, but only abnormal results are displayed) Labs Reviewed  COMPREHENSIVE METABOLIC PANEL - Abnormal; Notable for the following components:      Result Value   Sodium 131 (*)    CO2 20 (*)    Glucose, Bld 115 (*)    Creatinine, Ser 1.28 (*)    Total Protein 8.8 (*)    Albumin 3.2 (*)    Total Bilirubin 1.4 (*)    GFR calc non Af Amer 44 (*)    GFR calc Af Amer 51 (*)    All other components within normal limits  CBC WITH DIFFERENTIAL/PLATELET - Abnormal; Notable for the following components:   RBC 5.46 (*)    MCV 79.7 (*)    MCH 23.8 (*)    MCHC 29.9 (*)    All other components within normal limits  URINALYSIS, ROUTINE W REFLEX MICROSCOPIC - Abnormal; Notable for the following components:   APPearance TURBID (*)    Hgb urine dipstick MODERATE (*)    Ketones, ur 15 (*)    Protein, ur 100 (*)    Nitrite POSITIVE (*)    Leukocytes,Ua LARGE (*)    All other components within normal limits  URINALYSIS, MICROSCOPIC (REFLEX) - Abnormal; Notable for the following components:   Bacteria, UA MANY  (*)    All other components within normal limits  CULTURE, BLOOD (ROUTINE X 2)  CULTURE, BLOOD (ROUTINE X 2)  URINE CULTURE  LACTIC ACID, PLASMA  PROCALCITONIN  BRAIN NATRIURETIC PEPTIDE  HIV ANTIBODY (ROUTINE TESTING W REFLEX)  BASIC METABOLIC PANEL  CBC  LACTIC ACID, PLASMA    EKG EKG Interpretation  Date/Time:  Sunday October 20 2018 16:17:05 EST Ventricular Rate:  83 PR Interval:    QRS Duration: 87 QT Interval:  350 QTC Calculation: 412 R Axis:   -70 Text Interpretation:  Sinus rhythm Atrial premature complexes Left anterior fascicular block RSR' in V1 or V2, right VCD or RVH Baseline wander in lead(s) V6 No significant change since last tracing Confirmed by Richardean CanalYao, David H [phone]([id number]) on 10/20/2018 4:40:07 PM   Radiology Ct Abdomen Pelvis W Contrast  Result Date: 10/20/2018 CLINICAL DATA:  Fever and abdominal pain. EXAM: CT ABDOMEN AND PELVIS WITH CONTRAST TECHNIQUE: Multidetector CT imaging of the abdomen and pelvis was performed using the standard protocol following bolus administration of intravenous contrast. CONTRAST:  100mL OMNIPAQUE IOHEXOL 300 MG/ML  SOLN COMPARISON:  None. FINDINGS: Lower chest: Unremarkable. Hepatobiliary: Small area of low attenuation in the anterior liver, adjacent to the falciform ligament, is in a characteristic location for focal fatty deposition. 4 mm hypodensity in the inferior right liver is too small to characterize but likely a tiny cyst. There is no evidence for gallstones, gallbladder wall thickening, or pericholecystic fluid. Trace intrahepatic biliary duct prominence noted. Extrahepatic common bile duct measures 8 mm in the head of pancreas. Pancreas: No focal  mass lesion. No dilatation of the main duct. No intraparenchymal cyst. No peripancreatic edema. Spleen: No splenomegaly. No focal mass lesion. Adrenals/Urinary Tract: No adrenal nodule or mass. No suspicious mass lesion identified in either kidney. Delayed imaging shows circumferential wall  thickening in the right renal pelvis and proximal right ureter. Similar wall thickening is noted in the left renal pelvis. There is a small focus of hypoenhancement in the posterior interpolar left kidney on delayed imaging (8/8) probable some similar segmental edema in the upper pole of the right kidney on 08/08 as well., suggesting segmental edema. Circumferential bladder wall thickening evident. Stomach/Bowel: Stomach is unremarkable. No gastric wall thickening. No evidence of outlet obstruction. Duodenum is normally positioned as is the ligament of Treitz. No small bowel wall thickening. No small bowel dilatation. The terminal ileum is normal. The appendix is normal. No gross colonic mass. No colonic wall thickening. Vascular/Lymphatic: There is abdominal aortic atherosclerosis without aneurysm. There is no gastrohepatic or hepatoduodenal ligament lymphadenopathy. No intraperitoneal or retroperitoneal lymphadenopathy. No pelvic sidewall lymphadenopathy. Reproductive: Uterus unremarkable.  There is no adnexal mass. Other: No intraperitoneal free fluid. Musculoskeletal: No worrisome lytic or sclerotic osseous abnormality. IMPRESSION: 1. Fairly prominent wall thickening identified in each renal pelvis and proximal ureter on delayed imaging. Early imaging suggests that there is distal ureteral wall thickening as well and circumferential wall thickening is clearly evident in the urinary bladder. Focal areas of apparent segmental edema are noted in both kidneys. Imaging features are highly suspicious for urinary tract infection with bilateral pyelonephritis. While urothelial neoplasm can present with wall thickening in the renal pelvis or ureter, given the bilateral involvement and clinical history of fever and back pain, urothelial neoplasm is considered unlikely. 2.  Aortic Atherosclerois (ICD10-170.0) 3. Mild intra and extrahepatic biliary duct dilatation. Correlation with liver function test may prove helpful.  Electronically Signed   By: Kennith CenterEric  Mansell M.D.   On: 10/20/2018 19:12   Dg Chest Port 1 View  Result Date: 10/20/2018 CLINICAL DATA:  Fever and abdominal pain since 10/17/2018. EXAM: PORTABLE CHEST 1 VIEW COMPARISON:  PA and lateral chest 08/30/2017 and 10/04/2015. FINDINGS: Lungs are clear. Heart size is normal. The patient is status post CABG with a loop recorder in place. No pneumothorax or pleural effusion. Aortic atherosclerosis noted. No acute or focal bony abnormality. IMPRESSION: No acute disease. Atherosclerosis. Electronically Signed   By: Drusilla Kannerhomas  Dalessio M.D.   On: 10/20/2018 17:13    Procedures Procedures (including critical care time)  Medications Ordered in ED Medications  cefTRIAXone (ROCEPHIN) 1 g in sodium chloride 0.9 % 100 mL IVPB (0 g Intravenous Stopped 10/20/18 1737)  atorvastatin (LIPITOR) tablet 80 mg (has no administration in time range)  nitroGLYCERIN (NITROSTAT) SL tablet 0.4 mg (has no administration in time range)  apixaban (ELIQUIS) tablet 5 mg (has no administration in time range)  sodium chloride 0.9 % bolus 500 mL (has no administration in time range)  0.9 %  sodium chloride infusion (has no administration in time range)  acetaminophen (TYLENOL) tablet 650 mg (has no administration in time range)    Or  acetaminophen (TYLENOL) suppository 650 mg (has no administration in time range)  ondansetron (ZOFRAN) tablet 4 mg (has no administration in time range)    Or  ondansetron (ZOFRAN) injection 4 mg (has no administration in time range)  hydrALAZINE (APRESOLINE) injection 5 mg (has no administration in time range)  lactated ringers bolus 1,000 mL (0 mLs Intravenous Stopped 10/20/18 1744)    And  lactated ringers bolus 500 mL (0 mLs Intravenous Stopped 10/20/18 1926)    And  lactated ringers bolus 250 mL (0 mLs Intravenous Stopped 10/20/18 1926)  acetaminophen (TYLENOL) tablet 1,000 mg (1,000 mg Oral Given 10/20/18 1652)  iohexol (OMNIPAQUE) 300 MG/ML solution  100 mL (100 mLs Intravenous Contrast Given 10/20/18 1813)     Initial Impression / Assessment and Plan / ED Course  I have reviewed the triage vital signs and the nursing notes.  Pertinent labs & imaging results that were available during my care of the patient were reviewed by me and considered in my medical decision making (see chart for details).        63 year old female significant past medical history listed above concerning for CABG who presents with dysuria for the past 2 to 3 days, worsening fever, chills, night sweats.  Patient also complains of right flank, tenderness concerning for urosepsis, pyelonephritis.  Patient hemodynamically stable on arrival, denies any chest pain, headache.   Physical exam consistent with suprapubic tenderness, right upper quadrant, right flank tenderness as well as CVA concerning for urosepsis, pyelonephritis.  Patient meet sepsis criteria at this time with tachycardia, temperature, question of infectious source.  Will initiate fluid bolus, antibiotic regimen, laboratory studies.  Laboratory studies, imaging indicates UTI, positive nitrates.  Lactate 1.7.  Patient on reassessment feeling better, however still fatigued, hemodynamically stable.  CT scan indicates bilateral pyelonephritis.  Sepsis - Repeat Assessment  Performed at:    7pm  Vitals     Blood pressure 129/72, pulse (!) 54, temperature (!) 100.4 F (38 C), temperature source Oral, resp. rate 13, height  (1.651 m), weight 54.9 kg, SpO2 94 %.  Heart:     Regular rate and rhythm  Lungs:    CTA  Capillary Refill:   <2 sec  Peripheral Pulse:   Radial pulse palpable  Skin:     Normal Color  Patient will be admitted to the hospital service for continued work-up and de-escalation of sepsis.  Inpatient team in agreement with plan.  Patient in agreement and admitted in stable condition.  The above care was discussed and agreed upon by my attending physician.  Final Clinical  Impressions(s) / ED Diagnoses   Final diagnoses:  Sepsis, due to unspecified organism, unspecified whether acute organ dysfunction present [facility])  Acute pyelonephritis    ED Discharge Orders    None       Dahlia Client, MD 10/20/18 Corky Crafts    Charlynne Pander, MD 10/21/18 [phone]

## 2018-10-20 NOTE — ED Notes (Signed)
Admitting at bedside 

## 2018-10-21 ENCOUNTER — Encounter (HOSPITAL_COMMUNITY): Payer: Self-pay | Admitting: Internal Medicine

## 2018-10-21 DIAGNOSIS — I10 Essential (primary) hypertension: Secondary | ICD-10-CM | POA: Diagnosis not present

## 2018-10-21 DIAGNOSIS — N10 Acute tubulo-interstitial nephritis: Secondary | ICD-10-CM

## 2018-10-21 DIAGNOSIS — A4151 Sepsis due to Escherichia coli [E. coli]: Secondary | ICD-10-CM | POA: Diagnosis not present

## 2018-10-21 DIAGNOSIS — N179 Acute kidney failure, unspecified: Secondary | ICD-10-CM | POA: Diagnosis not present

## 2018-10-21 DIAGNOSIS — N182 Chronic kidney disease, stage 2 (mild): Secondary | ICD-10-CM

## 2018-10-21 LAB — CBC
HEMATOCRIT: 38.2 % (ref 36.0–46.0)
Hemoglobin: 11.8 g/dL — ABNORMAL LOW (ref 12.0–15.0)
MCH: 24.2 pg — ABNORMAL LOW (ref 26.0–34.0)
MCHC: 30.9 g/dL (ref 30.0–36.0)
MCV: 78.4 fL — ABNORMAL LOW (ref 80.0–100.0)
Platelets: 309 10*3/uL (ref 150–400)
RBC: 4.87 MIL/uL (ref 3.87–5.11)
RDW: 15.4 % (ref 11.5–15.5)
WBC: 9 10*3/uL (ref 4.0–10.5)
nRBC: 0 % (ref 0.0–0.2)

## 2018-10-21 LAB — HIV ANTIBODY (ROUTINE TESTING W REFLEX): HIV Screen 4th Generation wRfx: NONREACTIVE

## 2018-10-21 LAB — BASIC METABOLIC PANEL
Anion gap: 9 (ref 5–15)
BUN: 10 mg/dL (ref 8–23)
CO2: 21 mmol/L — ABNORMAL LOW (ref 22–32)
Calcium: 8.1 mg/dL — ABNORMAL LOW (ref 8.9–10.3)
Chloride: 105 mmol/L (ref 98–111)
Creatinine, Ser: 0.93 mg/dL (ref 0.44–1.00)
GFR calc Af Amer: >60 mL/min (ref >60)
GFR calc non Af Amer: >60 mL/min (ref >60)
GLUCOSE: 111 mg/dL — AB (ref 70–99)
Potassium: 3.8 mmol/L (ref 3.5–5.1)
Sodium: 135 mmol/L (ref 135–145)

## 2018-10-21 NOTE — Care Management Note (Signed)
Case Management Note Hortencia Conradi, RN MSN CCM Transitions of Care [address] [place] [phone]  Patient Details  Name: Mercedes [name] MRN: [id number] Date of Birth: 03-Oct-1955  Subjective/Objective:       Acute pyelonephritis           Action/Plan: Spoke with patient at bedside. PTA home alone. Independent. Drives self. Stated that she drove herself to the hospital. Declines TOC pharmacy stating to send her prescriptions to [facility]. No DME/HH needs. Wants opportunities to volunteer or work part time. Discussed contacting [employer]. Encouraged to f/u with PCP-Stacy Burns for hospital f/u. No transition of care needs identied at this time.   Expected Discharge Date:                  Expected Discharge Plan:  Home/Self Care  In-House Referral:  Clinical Social Work  Discharge planning Services  CM Consult  Post Acute Care Choice:  NA Choice offered to:  NA  DME Arranged:  N/A DME Agency:  NA  HH Arranged:  NA HH Agency:  NA  Status of Service:  In process, will continue to follow  If discussed at Long Length of Stay Meetings, dates discussed:    Additional Comments:  Bess Kinds, RN 10/21/2018, 4:07 PM

## 2018-10-21 NOTE — Care Management Obs Status (Signed)
MEDICARE OBSERVATION STATUS NOTIFICATION   Patient Details  Name: Mercedes [name] MRN: [id number] Date of Birth: [birth year]   Medicare Observation Status Notification Given:  Yes    Bess Kinds, RN 10/21/2018, 4:07 PM

## 2018-10-21 NOTE — Progress Notes (Signed)
[facility] PROGRESS NOTE  Mercedes [name] XMI:[id number] DOB: [birth year] DOA: 10/20/2018 PCP: Pincus Sanes, MD  Assessment/Plan:  Sepsis due to acute bilateral pyelonephritis: CT findings suggestive of pyelonephritis max temp this am 100.6. Tachycardia and tachypnea resolved. Lactic acid within limits of norma. Kidney function improving. Provided with IV fluids and rocephin. Currently hemodynamically stable. - continue Ceftriaxone by IV - Follow urine culture -follow blood culture -continue IV fluids -adjust antibiotics as indicated - prn Zofran for nausea  Essential hypertension: -fair control - continue to hold home lisinopril for now -Continue metoprolol with holding parameters for HR<65 and SBP<95 -IV hydralazine PRN  Hx of CORONARY ATHEROSCLEROSIS NATIVE CORONARY ARTERY: s/p of CABG and Stent placement. No CP or SOB. -continue Lipitor and as needed nitroglycerin  Paroxysmal atrial fibrillation [facility]): CHA2DS2-VASc Score is 3-4, needs oral anticoagulation. Patient is on Eliquis at home. Heart rate is well controlled.  -Continue metoprolol and Eliquis  Acute renal failure superimposed on stage 2 chronic kidney disease (HCC): appears resolved this am.  Baseline creatinine 0.94 on 10/09/2018.  Her creatinine was 1.28, BUN 12, likely due to UTI and continuation of lisinopril -IV fluid as above - Hold lisinopril -Follow-up by BMP  Code Status: full Family Communication: non present Disposition Plan: home hopefully in am   Consultants:    Procedures:    Antibiotics:  Rocephin 10/20/18  HPI/Subjective: Admitted for sepsis related to bilateral pyelonephritis. Continues with fever but feeling better.  Await cultures. Rocephin. Likely home in am  Denies dysuria, urgency. Denies back pain.     Objective: Vitals:   10/21/18 0457 10/21/18 0900  BP: 136/74 125/65  Pulse: 71 71  Resp: 18 18  Temp: 98.9 F (37.2 C) (!) 100.6 F (38.1 C)  SpO2: 94% 95%     Intake/Output Summary (Last 24 hours) at 10/21/2018 1255 Last data filed at 10/21/2018 0900 Gross per 24 hour  Intake 1087.5 ml  Output 1550 ml  Net -462.5 ml   Filed Weights   10/20/18 1614  Weight: 54.9 kg    Exam:   General:  Very pleasant alert lying in bed no acute distress  Cardiovascular: irregularly irregular no mgr no LE edema  Respiratory: normal effort BS clear bilaterally no wheeze  Abdomen: non-distended non-tender +BS no guarding or rebounding  Musculoskeletal: joints without swelling/erythema   Data Reviewed: Basic Metabolic Panel: Recent Labs  Lab 10/20/18 1639 10/21/18 0358  NA 131* 135  K 4.2 3.8  CL 100 105  CO2 20* 21*  GLUCOSE 115* 111*  BUN 12 10  CREATININE 1.28* 0.93  CALCIUM 9.0 8.1*   Liver Function Tests: Recent Labs  Lab 10/20/18 1639  AST 20  ALT 9  ALKPHOS 73  BILITOT 1.4*  PROT 8.8*  ALBUMIN 3.2*   No results for input(s): LIPASE, AMYLASE in the last 168 hours. No results for input(s): AMMONIA in the last 168 hours. CBC: Recent Labs  Lab 10/20/18 1639 10/21/18 0358  WBC 10.2 9.0  NEUTROABS 7.4  --   HGB 13.0 11.8*  HCT 43.5 38.2  MCV 79.7* 78.4*  PLT 338 309   Cardiac Enzymes: No results for input(s): CKTOTAL, CKMB, CKMBINDEX, TROPONINI in the last 168 hours. BNP (last 3 results) Recent Labs    10/20/18 2114  BNP 87.0    ProBNP (last 3 results) No results for input(s): PROBNP in the last 8760 hours.  CBG: No results for input(s): GLUCAP in the last 168 hours.  No results found for this or  any previous visit (from the past 240 hour(s)).   Studies: Ct Abdomen Pelvis W Contrast  Result Date: 10/20/2018 CLINICAL DATA:  Fever and abdominal pain. EXAM: CT ABDOMEN AND PELVIS WITH CONTRAST TECHNIQUE: Multidetector CT imaging of the abdomen and pelvis was performed using the standard protocol following bolus administration of intravenous contrast. CONTRAST:  100mL OMNIPAQUE IOHEXOL 300 MG/ML  SOLN  COMPARISON:  None. FINDINGS: Lower chest: Unremarkable. Hepatobiliary: Small area of low attenuation in the anterior liver, adjacent to the falciform ligament, is in a characteristic location for focal fatty deposition. 4 mm hypodensity in the inferior right liver is too small to characterize but likely a tiny cyst. There is no evidence for gallstones, gallbladder wall thickening, or pericholecystic fluid. Trace intrahepatic biliary duct prominence noted. Extrahepatic common bile duct measures 8 mm in the head of pancreas. Pancreas: No focal mass lesion. No dilatation of the main duct. No intraparenchymal cyst. No peripancreatic edema. Spleen: No splenomegaly. No focal mass lesion. Adrenals/Urinary Tract: No adrenal nodule or mass. No suspicious mass lesion identified in either kidney. Delayed imaging shows circumferential wall thickening in the right renal pelvis and proximal right ureter. Similar wall thickening is noted in the left renal pelvis. There is a small focus of hypoenhancement in the posterior interpolar left kidney on delayed imaging (8/8) probable some similar segmental edema in the upper pole of the right kidney on 08/08 as well., suggesting segmental edema. Circumferential bladder wall thickening evident. Stomach/Bowel: Stomach is unremarkable. No gastric wall thickening. No evidence of outlet obstruction. Duodenum is normally positioned as is the ligament of Treitz. No small bowel wall thickening. No small bowel dilatation. The terminal ileum is normal. The appendix is normal. No gross colonic mass. No colonic wall thickening. Vascular/Lymphatic: There is abdominal aortic atherosclerosis without aneurysm. There is no gastrohepatic or hepatoduodenal ligament lymphadenopathy. No intraperitoneal or retroperitoneal lymphadenopathy. No pelvic sidewall lymphadenopathy. Reproductive: Uterus unremarkable.  There is no adnexal mass. Other: No intraperitoneal free fluid. Musculoskeletal: No worrisome lytic  or sclerotic osseous abnormality. IMPRESSION: 1. Fairly prominent wall thickening identified in each renal pelvis and proximal ureter on delayed imaging. Early imaging suggests that there is distal ureteral wall thickening as well and circumferential wall thickening is clearly evident in the urinary bladder. Focal areas of apparent segmental edema are noted in both kidneys. Imaging features are highly suspicious for urinary tract infection with bilateral pyelonephritis. While urothelial neoplasm can present with wall thickening in the renal pelvis or ureter, given the bilateral involvement and clinical history of fever and back pain, urothelial neoplasm is considered unlikely. 2.  Aortic Atherosclerois (ICD10-170.0) 3. Mild intra and extrahepatic biliary duct dilatation. Correlation with liver function test may prove helpful. Electronically Signed   By: Kennith CenterEric  Mansell M.D.   On: 10/20/2018 19:12   Dg Chest Port 1 View  Result Date: 10/20/2018 CLINICAL DATA:  Fever and abdominal pain since 10/17/2018. EXAM: PORTABLE CHEST 1 VIEW COMPARISON:  PA and lateral chest 08/30/2017 and 10/04/2015. FINDINGS: Lungs are clear. Heart size is normal. The patient is status post CABG with a loop recorder in place. No pneumothorax or pleural effusion. Aortic atherosclerosis noted. No acute or focal bony abnormality. IMPRESSION: No acute disease. Atherosclerosis. Electronically Signed   By: Drusilla Kannerhomas  Dalessio M.D.   On: 10/20/2018 17:13    Scheduled Meds: . apixaban  5 mg Oral BID  . atorvastatin  80 mg Oral Daily  . metoprolol tartrate  12.5 mg Oral BID   Continuous Infusions: . sodium chloride 100  mL/hr at 10/20/18 2207  . cefTRIAXone (ROCEPHIN)  IV Stopped (10/20/18 1737)    Principal Problem:   Acute pyelonephritis Active Problems:   Paroxysmal atrial fibrillation (HCC)   Acute renal failure superimposed on stage 2 chronic kidney disease (HCC)   Sepsis (HCC)   Essential hypertension   CORONARY ATHEROSCLEROSIS  NATIVE CORONARY ARTERY   Hx of CABG   Hyperbilirubinemia    Time spent: 45 minutes    [facility] M NP [facility]  If 7PM-7AM, please contact night-coverage at [email or web address], password [facility] 10/21/2018, 12:55 PM  LOS: 0 days

## 2018-10-21 NOTE — Progress Notes (Signed)
Initial Nutrition Assessment  DOCUMENTATION CODES:   Not applicable  INTERVENTION:   Encourage good PO intake   NUTRITION DIAGNOSIS:   Inadequate oral intake related to acute illness, poor appetite as evidenced by per patient/family report, estimated needs.  GOAL:   Patient will meet greater than or equal to 90% of their needs  MONITOR:   PO intake, Labs, Weight trends  REASON FOR ASSESSMENT:   Malnutrition Screening Tool    ASSESSMENT:   63 yo female, admitted with acute pyelonephritis. Presents with fever, lower abdominal pain, BL flank pain, poor appetite. PMH significant for CAD, GERD, HLD, HTN, h/o MI, PAD, SLE.  Labs: glucose 111 Meds: Lipitor  Pt alert and pleasant, sitting in chair at time of visit. Reports eating nothing but water and crackers x 3 days. States her appetite is beginning to improve. Ate [place], vegetables, and fruit at dinner last night. She has not had a BM yet, but is passing gas. Denies nausea or vomiting. Encouraged pt to include protein-rich foods with all meals and snacks, and to eat those foods first if not feeling hungry. Declined protein supplements at this time.  NUTRITION - FOCUSED PHYSICAL EXAM: Deferred - will need NFPE if present for follow-up  Diet Order:  No known food allergies 2/23: NPO, Heart diet Diet Order            Diet Heart Room service appropriate? Yes; Fluid consistency: Thin  Diet effective now             0-25% x 3 meals recorded  EDUCATION NEEDS:  No education needs have been identified at this time  Skin:  Skin Assessment: Reviewed RN Assessment  Last BM:  2/20  Height:  Ht Readings from Last 1 Encounters:  10/20/18 5\' 5"  (1.651 m)    Weight:  Wt Readings from Last 10 Encounters:  10/20/18 54.9 kg  10/09/18 54.8 kg  06/27/18 54 kg  04/01/18 53.4 kg  03/01/18 54 kg  11/14/17 57.5 kg  11/06/17 58.1 kg  Noted 3.2 kg wt loss x 1 year = 5.5% is not indicative of malnutrition  Ideal Body  Weight:  56.8 kg  BMI:  Body mass index is 20.14 kg/m.  Estimated Nutritional Needs: calculated based on 2/23 wt (~55 kg)  Kcal:  1375-1650 (25-30 kcal/kg)  Protein:  66-83 gm (1.2-1.5 g/kg)  Fluid:  1 mL/kcal or per MD  Jolaine Artist, MS, RDN, LDN Pager: [phone] Available Mondays and Fridays, 9am-2pm

## 2018-10-22 ENCOUNTER — Ambulatory Visit (HOSPITAL_COMMUNITY): Admission: RE | Admit: 2018-10-22 | Payer: Medicare Other | Source: Home / Self Care | Admitting: Internal Medicine

## 2018-10-22 ENCOUNTER — Encounter (HOSPITAL_COMMUNITY): Admission: RE | Payer: Self-pay | Source: Home / Self Care

## 2018-10-22 ENCOUNTER — Telehealth: Payer: Self-pay | Admitting: *Deleted

## 2018-10-22 DIAGNOSIS — N10 Acute tubulo-interstitial nephritis: Secondary | ICD-10-CM | POA: Diagnosis not present

## 2018-10-22 DIAGNOSIS — A4151 Sepsis due to Escherichia coli [E. coli]: Secondary | ICD-10-CM | POA: Diagnosis not present

## 2018-10-22 LAB — COMPREHENSIVE METABOLIC PANEL
ALT: 7 U/L (ref 0–44)
ANION GAP: 9 (ref 5–15)
AST: 15 U/L (ref 15–41)
Albumin: 2.2 g/dL — ABNORMAL LOW (ref 3.5–5.0)
Alkaline Phosphatase: 52 U/L (ref 38–126)
BUN: 7 mg/dL — ABNORMAL LOW (ref 8–23)
CO2: 19 mmol/L — ABNORMAL LOW (ref 22–32)
Calcium: 8.1 mg/dL — ABNORMAL LOW (ref 8.9–10.3)
Chloride: 107 mmol/L (ref 98–111)
Creatinine, Ser: 0.89 mg/dL (ref 0.44–1.00)
GFR calc non Af Amer: >60 mL/min (ref >60)
Glucose, Bld: 94 mg/dL (ref 70–99)
Potassium: 3.8 mmol/L (ref 3.5–5.1)
Sodium: 135 mmol/L (ref 135–145)
Total Bilirubin: 0.6 mg/dL (ref 0.3–1.2)
Total Protein: 6 g/dL — ABNORMAL LOW (ref 6.5–8.1)

## 2018-10-22 LAB — URINE CULTURE

## 2018-10-22 SURGERY — PACEMAKER IMPLANT

## 2018-10-22 MED ORDER — CEFUROXIME AXETIL 500 MG PO TABS: 500.0000 mg | ORAL_TABLET | Freq: Two times a day (BID) | ORAL | 0 refills | Status: AC

## 2018-10-22 NOTE — Telephone Encounter (Signed)
Pt was on TCM list D/C 10/22/18 tried calling pt but keep getting busy signal. Will retry later to follow-up and schedule hosp f/u.Marland KitchenRaechel Chute

## 2018-10-22 NOTE — Discharge Summary (Signed)
Physician Discharge Summary  Mercedes [name] JKD:[id number] DOB: [birth year] DOA: 10/20/2018  PCP: Pincus Sanes, MD  Admit date: 10/20/2018 Discharge date: 10/22/2018  Time spent: 45 minutes  Recommendations for Outpatient Follow-up:  1. Follow up with PCP 1-2 weeks for evaluation of pyelonephritis. Recommend bmet for evaluation of kidney fucntion  Discharge Diagnoses:  Principal Problem:   Acute pyelonephritis Active Problems:   Paroxysmal atrial fibrillation (HCC)   Acute renal failure superimposed on stage 2 chronic kidney disease (HCC)   Sepsis (HCC)   Essential hypertension   CORONARY ATHEROSCLEROSIS NATIVE CORONARY ARTERY   Hx of CABG   Hyperbilirubinemia   Discharge Condition: stable  Diet recommendation: heart healthy  Filed Weights   10/20/18 1614  Weight: 54.9 kg    History of present illness:  Mercedes [name] is a very pleasant 63 y.o. female with medical history significant of hypertension, hyperlipidemia, GERD, depression, anxiety, Arnold-Chiari malformation type I, CAD, CABG/stent placement, PAF on Eliquis, PAD, SLE, RA, Sjogren's disease, Takotsubo cardiomyopathy, tachybradycardia syndrome (s/p of pacemaker placement), CKD 2, who presented 2/23 with fever, chills, dysuria, lower abdominal pain, bilateral flank pain.  Patient stated that she has been feeling bad for more than 3 days, symptoms include fever, chills, dysuria, burning on urination, increased urinary frequency, lower abdominal pain and bilateral flank pain.  She had nausea, no vomiting or diarrhea.  The lower abdominal pain described as constant, moderate, sharp, nonradiating.  Patient denied any chest pain, shortness breath, cough.  No unilateral weakness. Pt stated that she feels so bad that she did not take her medications in past 2 days. Work up revealed UTI and CT concerning for bilateral pyelonephritis  Hospital Course:  Sepsis due to acute bilateral pyelonephritis: CT findings suggestive of  pyelonephritis max temp100.6. Tachycardia and tachypnea resolved. Lactic acid within limits of normal. Kidney function at baseline at discharge. Provided with IV fluids and rocephin.remained hemodynamically stable. Urine culture with Ecoli sensitive to ceftin. Will discharge with 5 days ceftin for total 7 days antibiotics. Follow up with PCP 1-2 weeks. At discharge patient ambulating, tolerating diet, asymptomatic non-toxic.   Essential hypertension: -fair control. Resume home meds at discharge.   Hx ofCORONARY ATHEROSCLEROSIS NATIVE CORONARY ARTERY: s/p of CABG and Stent placement. No CP or SOB.   Paroxysmal atrial fibrillation (HCC):CHA2DS2-VASc Scoreis 3-4, needs oral anticoagulation. Patient is on Eliquis at home. Heart rate is well controlled.  Acute renal failure superimposed on stage 2 chronic kidney disease (HCC): appears resolved. Baseline creatinine 0.94 on 10/09/2018. Her creatinine was 1.28, BUN 12, likely due to UTI and continuation of lisinopril. Follow up PCP 1-2 weeks for evaluation of kidney function  Procedures:    Consultations:    Discharge Exam: Vitals:   10/21/18 2155 10/22/18 0438  BP: 137/65 118/80  Pulse: 66 (!) 54  Resp: 18 18  Temp: 99.7 F (37.6 C) 99 F (37.2 C)  SpO2: 96% 95%    General: sitting up in bed watching tv smiling in no acute distress Cardiovascular: irregularly irregular. No mgr no LE edema Respiratory: normal effort BS clear bilaterally no wheeze  Discharge Instructions   Discharge Instructions    Call MD for:  persistant nausea and vomiting   Complete by:  As directed    Call MD for:  severe uncontrolled pain   Complete by:  As directed    Call MD for:  temperature >100.4   Complete by:  As directed    Diet - low sodium heart healthy  Complete by:  As directed    Discharge instructions   Complete by:  As directed    Take medication as prescribed Drink plenty of fluids Follow up with PCP 1-2 weeks for urinalysis  to ensure resolution of infection   Increase activity slowly   Complete by:  As directed      Allergies as of 10/22/2018      Reactions   Brilinta [ticagrelor] Anaphylaxis, Other (See Comments)   Also, chest tightness   Latex Rash      Medication List    TAKE these medications   atorvastatin 80 MG tablet Commonly known as:  LIPITOR Take 1 tablet (80 mg total) by mouth daily.   cefUROXime 500 MG tablet Commonly known as:  CEFTIN Take 1 tablet (500 mg total) by mouth 2 (two) times daily for 5 days.   ELIQUIS 5 MG Tabs tablet Generic drug:  apixaban TAKE 1 TABLET BY MOUTH TWICE DAILY What changed:  how much to take   lisinopril 5 MG tablet Commonly known as:  PRINIVIL,ZESTRIL TAKE 1 TABLET(5 MG) BY MOUTH DAILY. FOLLOW UP APPOINTMENT FOR FURTHER REFILLS What changed:  See the new instructions.   metoprolol tartrate 25 MG tablet Commonly known as:  LOPRESSOR Take 0.5 tablets (12.5 mg total) by mouth 2 (two) times daily.   nitroGLYCERIN 0.4 MG SL tablet Commonly known as:  NITROSTAT Place 1 tablet (0.4 mg total) under the tongue every 5 (five) minutes as needed for chest pain.      Allergies  Allergen Reactions  . Brilinta [Ticagrelor] Anaphylaxis and Other (See Comments)    Also, chest tightness  . Latex Rash      The results of significant diagnostics from this hospitalization (including imaging, microbiology, ancillary and laboratory) are listed below for reference.    Significant Diagnostic Studies: Ct Abdomen Pelvis W Contrast  Result Date: 10/20/2018 CLINICAL DATA:  Fever and abdominal pain. EXAM: CT ABDOMEN AND PELVIS WITH CONTRAST TECHNIQUE: Multidetector CT imaging of the abdomen and pelvis was performed using the standard protocol following bolus administration of intravenous contrast. CONTRAST:  OMNIPAQUE IOHEXOL 300 MG/ML  SOLN COMPARISON:  None. FINDINGS: Lower chest: Unremarkable. Hepatobiliary: Small area of low attenuation in the anterior liver,  adjacent to the falciform ligament, is in a characteristic location for focal fatty deposition. 4 mm hypodensity in the inferior right liver is too small to characterize but likely a tiny cyst. There is no evidence for gallstones, gallbladder wall thickening, or pericholecystic fluid. Trace intrahepatic biliary duct prominence noted. Extrahepatic common bile duct measures 8 mm in the head of pancreas. Pancreas: No focal mass lesion. No dilatation of the main duct. No intraparenchymal cyst. No peripancreatic edema. Spleen: No splenomegaly. No focal mass lesion. Adrenals/Urinary Tract: No adrenal nodule or mass. No suspicious mass lesion identified in either kidney. Delayed imaging shows circumferential wall thickening in the right renal pelvis and proximal right ureter. Similar wall thickening is noted in the left renal pelvis. There is a small focus of hypoenhancement in the posterior interpolar left kidney on delayed imaging (8/8) probable some similar segmental edema in the upper pole of the right kidney on 08/08 as well., suggesting segmental edema. Circumferential bladder wall thickening evident. Stomach/Bowel: Stomach is unremarkable. No gastric wall thickening. No evidence of outlet obstruction. Duodenum is normally positioned as is the ligament of Treitz. No small bowel wall thickening. No small bowel dilatation. The terminal ileum is normal. The appendix is normal. No gross colonic mass. No colonic wall  thickening. Vascular/Lymphatic: There is abdominal aortic atherosclerosis without aneurysm. There is no gastrohepatic or hepatoduodenal ligament lymphadenopathy. No intraperitoneal or retroperitoneal lymphadenopathy. No pelvic sidewall lymphadenopathy. Reproductive: Uterus unremarkable.  There is no adnexal mass. Other: No intraperitoneal free fluid. Musculoskeletal: No worrisome lytic or sclerotic osseous abnormality. IMPRESSION: 1. Fairly prominent wall thickening identified in each renal pelvis and  proximal ureter on delayed imaging. Early imaging suggests that there is distal ureteral wall thickening as well and circumferential wall thickening is clearly evident in the urinary bladder. Focal areas of apparent segmental edema are noted in both kidneys. Imaging features are highly suspicious for urinary tract infection with bilateral pyelonephritis. While urothelial neoplasm can present with wall thickening in the renal pelvis or ureter, given the bilateral involvement and clinical history of fever and back pain, urothelial neoplasm is considered unlikely. 2.  Aortic Atherosclerois (ICD10-170.0) 3. Mild intra and extrahepatic biliary duct dilatation. Correlation with liver function test may prove helpful. Electronically Signed   By: Kennith Center M.D.   On: 10/20/2018 19:12   Dg Chest Port 1 View  Result Date: 10/20/2018 CLINICAL DATA:  Fever and abdominal pain since 10/17/2018. EXAM: PORTABLE CHEST 1 VIEW COMPARISON:  PA and lateral chest 08/30/2017 and 10/04/2015. FINDINGS: Lungs are clear. Heart size is normal. The patient is status post CABG with a loop recorder in place. No pneumothorax or pleural effusion. Aortic atherosclerosis noted. No acute or focal bony abnormality. IMPRESSION: No acute disease. Atherosclerosis. Electronically Signed   By: Drusilla Kanner M.D.   On: 10/20/2018 17:13    Microbiology: Recent Results (from the past 240 hour(s))  Blood Culture (routine x 2)     Status: None (Preliminary result)   Collection Time: 10/20/18  4:25 PM  Result Value Ref Range Status   Specimen Description BLOOD RIGHT FOREARM  Final   Special Requests   Final    BOTTLES DRAWN AEROBIC AND ANAEROBIC Blood Culture adequate volume   Culture   Final    NO GROWTH < 24 HOURS Performed at [facility], [address] [place], [place] [address]    Report Status PENDING  Incomplete  Urine culture     Status: Abnormal   Collection Time: 10/20/18  4:29 PM  Result Value Ref Range Status    Specimen Description URINE, RANDOM  Final   Special Requests   Final    NONE Performed at [facility], [address] [place], [place] [address]    Culture >=100,000 COLONIES/mL ESCHERICHIA COLI (A)  Final   Report Status 10/22/2018 FINAL  Final   Organism ID, Bacteria ESCHERICHIA COLI (A)  Final      Susceptibility   Escherichia coli - MIC*    AMPICILLIN >=32 RESISTANT Resistant     CEFAZOLIN 16 SENSITIVE Sensitive     CEFTRIAXONE <=1 SENSITIVE Sensitive     CIPROFLOXACIN <=0.25 SENSITIVE Sensitive     GENTAMICIN <=1 SENSITIVE Sensitive     IMIPENEM <=0.25 SENSITIVE Sensitive     NITROFURANTOIN <=16 SENSITIVE Sensitive     TRIMETH/SULFA <=20 SENSITIVE Sensitive     AMPICILLIN/SULBACTAM >=32 RESISTANT Resistant     PIP/TAZO <=4 SENSITIVE Sensitive     Extended ESBL NEGATIVE Sensitive     * >=100,000 COLONIES/mL ESCHERICHIA COLI  Blood Culture (routine x 2)     Status: None (Preliminary result)   Collection Time: 10/20/18  4:30 PM  Result Value Ref Range Status   Specimen Description BLOOD LEFT ANTECUBITAL  Final   Special  Requests   Final    BOTTLES DRAWN AEROBIC AND ANAEROBIC Blood Culture adequate volume   Culture   Final    NO GROWTH < 24 HOURS Performed at [facility], [address] [place], [place] [address]    Report Status PENDING  Incomplete     Labs: Basic Metabolic Panel: Recent Labs  Lab 10/20/18 1639 10/21/18 0358 10/22/18 0427  NA 131* 135 135  K 4.2 3.8 3.8  CL 100 105 107  CO2 20* 21* 19*  GLUCOSE 115* 111* 94  BUN 12 10 7*  CREATININE 1.28* 0.93 0.89  CALCIUM 9.0 8.1* 8.1*   Liver Function Tests: Recent Labs  Lab 10/20/18 1639 10/22/18 0427  AST 20 15  ALT 9 7  ALKPHOS 73 52  BILITOT 1.4* 0.6  PROT 8.8* 6.0*  ALBUMIN 3.2* 2.2*   No results for input(s): LIPASE, AMYLASE in the last 168 hours. No results for input(s): AMMONIA in the last 168 hours. CBC: Recent Labs  Lab 10/20/18 1639 10/21/18 0358  WBC 10.2 9.0   NEUTROABS 7.4  --   HGB 13.0 11.8*  HCT 43.5 38.2  MCV 79.7* 78.4*  PLT 338 309   Cardiac Enzymes: No results for input(s): CKTOTAL, CKMB, CKMBINDEX, TROPONINI in the last 168 hours. BNP: BNP (last 3 results) Recent Labs    10/20/18 2114  BNP 87.0    ProBNP (last 3 results) No results for input(s): PROBNP in the last 8760 hours.  CBG: No results for input(s): GLUCAP in the last 168 hours.     SignedGwenyth Bender:  BLACK,KAREN M NP [facility] 10/22/2018, 8:50 AM

## 2018-10-23 NOTE — Telephone Encounter (Signed)
Tried calling pt again still not able to get through. Line is busy. Called emergency contact grand-daughter (shay) there was no answer LMOM RTC need to make pt hosp f/u w/pCP.[name]/LMB

## 2018-10-24 NOTE — Telephone Encounter (Signed)
Pt has not return call back...still not able to contact pt keep getting busy signal. Per chart pt has not seen Dr. Lawerance Bach since 03/2017. Closing encounter...Raechel Chute

## 2018-10-25 LAB — CULTURE, BLOOD (ROUTINE X 2)
Culture: NO GROWTH
Culture: NO GROWTH
Special Requests: ADEQUATE
Special Requests: ADEQUATE

## 2018-11-04 NOTE — Progress Notes (Signed)
Subjective:    Patient ID: Mercedes [name], female    DOB: 1955-09-28, 63 y.o.   MRN: [id number]  HPI The patient is here for follow up.  Admitted 10/20/2018-10/22/2018.  She was admitted for acute pyelonephritis.   She presented to the emergency room with fever, chills, dysuria, increased urinary frequency, lower abdominal pain and bilateral flank pain.  She stated she did not feel well for 3 days.  She had nausea, but no vomiting or diarrhea.  The lower abdominal pain was constant, moderate, sharp and nonradiating.  She denied chest pain, shortness of breath and cough.  She felt so that she is not able to take any medication for 2 days.  Work-up in the emergency room revealed a UTI and CT scan was concerning for bilateral pyelonephritis.  Sepsis due to acute bilateral pyelonephritis. Maximum temperature 100.6.  She was tachycardic and tachypneic.  Lactic acid was normal.  Kidney function at baseline at discharge.  CT scan suggestive of bilateral pyelonephritis.  She received IV fluids and Rocephin.  She remained hemodynamically stable.  Urine culture and E. coli sensitive for Ceftin, which she was discharged with 5 days for a total of 7 days of antibiotics  Essential hypertension. Fair control.  Home meds continued  Coronary artery disease, s/p CABG, p Afib. No chest pain or shortness of breath On oral anticoagulation with Eliquis, heart rate controlled  Acute renal failure superimposed on stage II chronic kidney disease. Baseline creatinine 0.94, creatinine 1.28 on admission-secondary to UTI and continuation of lisinopril Acute kidney failure resolved Advised to recheck BMP    Sepsis, pyelonephritis:  She completed her antibiotic.  She denies any urinary symptoms, abdominal pain, nausea and back pain.    Hypertension: She is taking her medication daily. She is compliant with a low sodium diet.  She denies chest pain, palpitations, edema, shortness of breath and regular  headaches.   She does monitor her blood pressure at home - 138/76.    Diabetes:  She forgot that she was a diabetic.  She is not compliant with a low sugar/carbohydrate diet.     Medications and allergies reviewed with patient and updated if appropriate.  Patient Active Problem List   Diagnosis Date Noted  . Hyperbilirubinemia 10/21/2018  . Acute pyelonephritis 10/20/2018  . Acute renal failure superimposed on stage 2 chronic kidney disease (HCC) 10/20/2018  . Sepsis (HCC) 10/20/2018  . Diabetes (HCC) 04/01/2017  . Hyperthyroidism 02/21/2017  . Low mean corpuscular volume (MCV) 09/30/2016  . Coronary artery disease involving native heart without angina pectoris   . Hx of CABG 07/17/2016  . Paroxysmal atrial fibrillation (HCC) 07/12/2016  . NSTEMI (non-ST elevated myocardial infarction) (HCC) 07/11/2016  . History of arterial bypass of lower extremity 12/30/2015  . Claudication of right lower extremity (HCC) 12/30/2015  . Phantom limb pain-Left great toe 04/29/2015  . Seizure (HCC)   . Atrial flutter with rapid ventricular response vs. SVT 10/27/2014    Class: Acute  . 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 PAD (peripheral artery disease) (HCC) 12/02/2013  . SLE (systemic lupus erythematosus) (HCC)   . CAD - S/P Takotsubo MI in 2000, CFX DES Sept 2014 05/25/2013  . Acute MI, inferior wall (HCC) 05/19/2013  . 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 Tachycardia-bradycardia (HCC) 03/09/2010  . PVD- s/p Lt [facility] April 2015 10/29/2009  . Dyslipidemia 08/26/2009  . Essential hypertension 08/26/2009  . CORONARY ATHEROSCLEROSIS NATIVE CORONARY ARTERY 08/26/2009  .  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 Rheumatoid arthritis (HCC) 08/26/2009    Current Outpatient Medications on File Prior to Visit  Medication Sig Dispense Refill  . ELIQUIS 5 MG TABS tablet TAKE 1 TABLET BY MOUTH TWICE DAILY (Patient taking differently: Take 5 mg by  mouth 2 (two) times daily. ) 180 tablet 3  . lisinopril (PRINIVIL,ZESTRIL) 5 MG tablet TAKE 1 TABLET(5 MG) BY MOUTH DAILY. FOLLOW UP APPOINTMENT FOR FURTHER REFILLS (Patient taking differently: Take 5 mg by mouth daily as needed (if Systolic reading is 180 or greater). ) 90 tablet 3  . metoprolol tartrate (LOPRESSOR) 25 MG tablet Take 0.5 tablets (12.5 mg total) by mouth 2 (two) times daily. 180 tablet 3  . nitroGLYCERIN (NITROSTAT) 0.4 MG SL tablet Place 1 tablet (0.4 mg total) under the tongue every 5 (five) minutes as needed for chest pain. 25 tablet 3   No current facility-administered medications on file prior to visit.     Past Medical History:  Diagnosis Date  . Abnormal LFTs    a. in the past when on Lipitor  . Anemia    as a young woman  . Anxiety   . Arnold-Chiari malformation, type I (HCC)    MRI brain 02/2010  . CAD (coronary artery disease)    a. NSTEMI 07/2009: LHC - D2 40%, LAD irreg., EF 50% with apical AK (tako-tsubo CM);  b. Inf STEMI (04/2013): Tx Promus DES to CFX;  c. 08/2013 Lexi CL: No ischemia, dist ant/ap/inf-ap infarct, EF  d. 06/2016 NSTEMI with LM disease: s/p CABG on 07/17/2016 w/ LIMA-LAD, SVG-OM, and SVG-dRCA.  [name] DEPRESSION   . Dizziness    ? CVA 01/2010 - carotid dopplers with no ICA stenosis  . GERD (gastroesophageal reflux disease)   . HYPERLIPIDEMIA   . HYPERTENSION   . MI (myocardial infarction) (HCC)   . PAD (peripheral artery disease) (HCC)    s/p L fem pop 11/2013 in setting of 1st toe osteo/gangrene  . Paroxysmal atrial fibrillation (HCC)    a. anticoagulated with Eliquis 10/2014  . RA (rheumatoid arthritis) (HCC)    prior tx by Dr. Dareen Piano  . Sjogren's syndrome (HCC) 06/02/13   pt denies this (12/01/13)  . SLE (systemic lupus erythematosus) (HCC) dx 05/2013   follows with rheum Dareen Piano)  . Tachy-brady syndrome (HCC)    a. s/p STJ Leadless pacemaker 11-04-2014 Dr Johney Frame  . 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 ABDOMINAL AORTAGRAM N/A 11/24/2013   Procedure: ABDOMINAL AORTAGRAM WITH BILATERAL RUNOFF WITH POSSIBLE INTERVENTION;  Surgeon: Chuck Hint, MD;  Location: [facility] CATH LAB;  Service: Cardiovascular;  Laterality: N/A;  . AMPUTATION Left 12/02/2013   Procedure: LEFT 1ST TOE AMPUTATION;  Surgeon: Sherren Kerns, MD;  Location: [facility] OR;  Service: Vascular;  Laterality: Left;  . CARDIAC CATHETERIZATION N/A 07/12/2016   Procedure: Left Heart Cath and Coronary Angiography;  Surgeon: Lennette Bihari, MD;  Location: [facility] INVASIVE CV LAB;  Service: Cardiovascular;  Laterality: N/A;  . CARDIAC CATHETERIZATION N/A 07/14/2016   Procedure: Intravascular Ultrasound/IVUS;  Surgeon: Yvonne Kendall, MD;  Location: [facility] INVASIVE CV LAB;  Service: Cardiovascular;  Laterality: N/A;  . COLONOSCOPY    . CORONARY ANGIOPLASTY  04/2013  . CORONARY ARTERY BYPASS GRAFT N/A 07/17/2016   Procedure: CORONARY ARTERY BYPASS GRAFTING (CABG)x3 using right greater saphenous vein harvested endoscopiclly and left internal  mammary artery;  Surgeon: Kerin PernaPeter Van Trigt, MD;  [facility];  Service: Open Heart Surgery;  Laterality: N/A;  . FEMORAL-POPLITEAL BYPASS GRAFT Left 12/02/2013   Procedure:  LEFT FEMORAL-BELOW KNEE POPLITEAL ARTERY BYPASS GRAFT;  Surgeon: Sherren Kernsharles E Fields, MD;  [facility];  Service: Vascular;  Laterality: Left;  . LEFT HEART CATHETERIZATION WITH CORONARY ANGIOGRAM N/A 05/19/2013   Procedure: LEFT HEART CATHETERIZATION WITH CORONARY ANGIOGRAM;  Surgeon: Micheline ChapmanMichael D Cooper, MD;  [facility];  Service: Cardiovascular;  Laterality: N/A;  . LEFT HEART CATHETERIZATION WITH CORONARY ANGIOGRAM N/A 10/28/2014   Procedure: LEFT HEART CATHETERIZATION WITH CORONARY ANGIOGRAM;  Surgeon: Iran OuchMuhammad A Arida, MD;  Location: [facility] CATH LAB;  Service: Cardiovascular;  Laterality: N/A;  . PERCUTANEOUS CORONARY STENT INTERVENTION (PCI-S)   05/19/2013   Procedure: PERCUTANEOUS CORONARY STENT INTERVENTION (PCI-S);  Surgeon: Micheline ChapmanMichael D Cooper, MD;  [facility];  Service: Cardiovascular;;  . PERMANENT PACEMAKER INSERTION N/A 11/04/2014   STJ Leadless pacemaker implanted by Dr Johney FrameAllred for tachy/brady  . TEE WITHOUT CARDIOVERSION N/A 07/17/2016   Procedure: TRANSESOPHAGEAL ECHOCARDIOGRAM (TEE);  Surgeon: Kerin PernaPeter Van Trigt, MD;  [facility];  Service: Open Heart Surgery;  Laterality: N/A;  . TUBAL LIGATION      Social History   Socioeconomic History  . Marital status: Single    Spouse name: Not on file  . Number of children: 3  . Years of education: Not on file  . Highest education level: Not on file  Occupational History  . Not on file  Social Needs  . Financial resource strain: Not on file  . Food insecurity:    Worry: Not on file    Inability: Not on file  . Transportation needs:    Medical: Not on file    Non-medical: Not on file  Tobacco Use  . Smoking status: Former Smoker    Types: Cigarettes    Last attempt to quit: 05/19/2016    Years since quitting: 2.4  . Smokeless tobacco: Never Used  . Tobacco comment:    Substance and Sexual Activity  . Alcohol use: No    Alcohol/week: 0.0 standard drinks    Frequency: Never  . Drug use: No  . Sexual activity: Not on file  Lifestyle  . Physical activity:    Days per week: Not on file    Minutes per session: Not on file  . Stress: Not on file  Relationships  . Social connections:    Talks on phone: Not on file    Gets together: Not on file    Attends religious service: Not on file    Active member of club or organization: Not on file    Attends meetings of clubs or organizations: Not on file    Relationship status: Not on file  Other Topics Concern  . Not on file  Social History Narrative   Lives alone.    Family History  Problem Relation Age of Onset  . Arthritis Mother   . Emphysema Mother   . Arthritis Father   . COPD Father   . Heart  disease Father   . Diabetes Paternal Aunt        paternal Aunts  . Heart disease Paternal Aunt   . Thyroid disease Neg Hx     Review of Systems  Constitutional: Negative for chills and fever.  Respiratory: Negative for cough, shortness of breath and wheezing.   Cardiovascular: Negative for chest pain, palpitations and leg swelling.  Gastrointestinal:  Negative for abdominal pain and nausea.  Genitourinary: Negative for difficulty urinating, dysuria, frequency and hematuria.  Musculoskeletal: Negative for back pain.  Neurological: Positive for headaches (the other day, none today). Negative for light-headedness.       Objective:   Vitals:   11/05/18 1002 11/05/18 1024  BP: (!) 212/102 (!) 185/100  Resp: 16   Temp: 98.2 F (36.8 C)    BP Readings from Last 3 Encounters:  11/05/18 (!) 185/100  10/22/18 (!) 161/78  10/09/18 128/84   Wt Readings from Last 3 Encounters:  11/05/18 120 lb 12.8 oz (54.8 kg)  10/20/18 121 lb (54.9 kg)  10/09/18 120 lb 12.8 oz (54.8 kg)   Body mass index is 20.1 kg/m.   Physical Exam    Constitutional: Appears well-developed and well-nourished. No distress.  HENT:  Head: Normocephalic and atraumatic.  Neck: Neck supple. No tracheal deviation present. No thyromegaly present.  No cervical lymphadenopathy Cardiovascular: Normal rate, regular rhythm and normal heart sounds.   No murmur heard. No carotid bruit .  No edema Pulmonary/Chest: Effort normal and breath sounds normal. No respiratory distress. No has no wheezes. No rales.  Abdomen:  Soft, ND, NT, no CVA tenderness Skin: Skin is warm and dry. Not diaphoretic.  Psychiatric: Normal mood and affect. Behavior is normal.      Assessment & Plan:    See Problem List for Assessment and Plan of chronic medical problems.

## 2018-11-05 ENCOUNTER — Ambulatory Visit (INDEPENDENT_AMBULATORY_CARE_PROVIDER_SITE_OTHER): Payer: Medicare Other | Admitting: Internal Medicine

## 2018-11-05 ENCOUNTER — Other Ambulatory Visit (INDEPENDENT_AMBULATORY_CARE_PROVIDER_SITE_OTHER): Payer: Medicare Other

## 2018-11-05 ENCOUNTER — Ambulatory Visit: Payer: Medicare Other

## 2018-11-05 ENCOUNTER — Encounter: Payer: Self-pay | Admitting: Internal Medicine

## 2018-11-05 VITALS — BP 185/100 | Temp 98.2°F | Resp 16 | Ht 65.0 in | Wt 120.8 lb

## 2018-11-05 DIAGNOSIS — I10 Essential (primary) hypertension: Secondary | ICD-10-CM

## 2018-11-05 DIAGNOSIS — E119 Type 2 diabetes mellitus without complications: Secondary | ICD-10-CM

## 2018-11-05 DIAGNOSIS — I2510 Atherosclerotic heart disease of native coronary artery without angina pectoris: Secondary | ICD-10-CM | POA: Diagnosis not present

## 2018-11-05 DIAGNOSIS — N182 Chronic kidney disease, stage 2 (mild): Secondary | ICD-10-CM

## 2018-11-05 DIAGNOSIS — N10 Acute tubulo-interstitial nephritis: Secondary | ICD-10-CM

## 2018-11-05 DIAGNOSIS — N179 Acute kidney failure, unspecified: Secondary | ICD-10-CM

## 2018-11-05 DIAGNOSIS — A419 Sepsis, unspecified organism: Secondary | ICD-10-CM

## 2018-11-05 LAB — BASIC METABOLIC PANEL
BUN: 12 mg/dL (ref 6–23)
CALCIUM: 9.8 mg/dL (ref 8.4–10.5)
CO2: 25 mEq/L (ref 19–32)
Chloride: 104 mEq/L (ref 96–112)
Creatinine, Ser: 0.85 mg/dL (ref 0.40–1.20)
GFR: 81.27 mL/min (ref >60.00)
Glucose, Bld: 96 mg/dL (ref 70–99)
Potassium: 4.5 mEq/L (ref 3.5–5.1)
Sodium: 136 mEq/L (ref 135–145)

## 2018-11-05 LAB — HEMOGLOBIN A1C: Hgb A1c MFr Bld: 6.4 % (ref 4.6–6.5)

## 2018-11-05 NOTE — Assessment & Plan Note (Signed)
Resolved No urinary or other concerning symptoms  No further testing needed

## 2018-11-05 NOTE — Assessment & Plan Note (Signed)
Resolved - secondary to pyelonephritis

## 2018-11-05 NOTE — Assessment & Plan Note (Signed)
Stressed again that she is a diabetic and she needs to limit sugars/carbs Stressed regular exercise a1c today Advised 6 month f/u Briefly discussed started a medication such as jardiance that may dec future MI/CVA risk

## 2018-11-05 NOTE — Assessment & Plan Note (Signed)
No chest pain, palps, SOB Continue current medications Following with cardiology

## 2018-11-05 NOTE — Assessment & Plan Note (Signed)
Blood pressure is very high here today, which is unusual.  She does have some element of whitecoat hypertension, but has never been this elevated. On repeat it did improve, but still elevated Her blood pressure today was 138/76 at home.  She states she was checking it regularly since leaving the hospital and it was well controlled Seems to be reasonably controlled in the hospital We will continue current medication and she will continue to monitor very closely at home BMP

## 2018-11-05 NOTE — Assessment & Plan Note (Signed)
GFR improved while in the hospital  Drinking, eating normally No urinary symptoms BMP today

## 2018-11-05 NOTE — Patient Instructions (Addendum)
  Tests ordered today. Your results will be released to MyChart (or called to you) after review, usually within 72hours after test completion. If any changes need to be made, you will be notified at that same time.  Medications reviewed and updated.  Changes include :   none      Please followup in 6 months   

## 2018-11-06 ENCOUNTER — Encounter: Payer: Self-pay | Admitting: Internal Medicine

## 2018-11-06 NOTE — Telephone Encounter (Signed)
Copied from CRM [id number]. Topic: General - Inquiry >> Nov 06, 2018 11:34 AM Baldo Daub L wrote: Reason for CRM:   Pt calling to get lab results from yesterday. Pt can be reached at [phone]

## 2018-11-06 NOTE — Telephone Encounter (Signed)
Pt aware of results 

## 2019-01-07 ENCOUNTER — Telehealth: Payer: Self-pay

## 2019-01-07 NOTE — Telephone Encounter (Signed)
Spoke with pt regarding appt on 01/08/19. Pt stated she felt this appt is not important enough to keep and would like to reschedule. Pt was advise to keep ppt because it might be a while before she could get another appt. Pt declined and ask to reschedule.

## 2019-01-08 ENCOUNTER — Encounter: Payer: Medicare Other | Admitting: Internal Medicine

## 2019-01-28 ENCOUNTER — Encounter (HOSPITAL_COMMUNITY): Payer: Self-pay | Admitting: Emergency Medicine

## 2019-01-28 ENCOUNTER — Other Ambulatory Visit: Payer: Self-pay

## 2019-01-28 ENCOUNTER — Emergency Department (HOSPITAL_COMMUNITY): Payer: Medicare Other

## 2019-01-28 ENCOUNTER — Emergency Department (HOSPITAL_COMMUNITY)
Admission: EM | Admit: 2019-01-28 | Discharge: 2019-01-28 | Disposition: A | Discharge status: Home / Self Care | Payer: Medicare Other | Attending: Emergency Medicine | Admitting: Emergency Medicine

## 2019-01-28 DIAGNOSIS — I129 Hypertensive chronic kidney disease with stage 1 through stage 4 chronic kidney disease, or unspecified chronic kidney disease: Secondary | ICD-10-CM | POA: Diagnosis not present

## 2019-01-28 DIAGNOSIS — Z87891 Personal history of nicotine dependence: Secondary | ICD-10-CM | POA: Diagnosis not present

## 2019-01-28 DIAGNOSIS — Z7901 Long term (current) use of anticoagulants: Secondary | ICD-10-CM | POA: Insufficient documentation

## 2019-01-28 DIAGNOSIS — Z89412 Acquired absence of left great toe: Secondary | ICD-10-CM | POA: Diagnosis not present

## 2019-01-28 DIAGNOSIS — Z79899 Other long term (current) drug therapy: Secondary | ICD-10-CM | POA: Insufficient documentation

## 2019-01-28 DIAGNOSIS — Z950 Presence of cardiac pacemaker: Secondary | ICD-10-CM | POA: Diagnosis not present

## 2019-01-28 DIAGNOSIS — I2510 Atherosclerotic heart disease of native coronary artery without angina pectoris: Secondary | ICD-10-CM | POA: Insufficient documentation

## 2019-01-28 DIAGNOSIS — Z951 Presence of aortocoronary bypass graft: Secondary | ICD-10-CM | POA: Diagnosis not present

## 2019-01-28 DIAGNOSIS — N182 Chronic kidney disease, stage 2 (mild): Secondary | ICD-10-CM | POA: Insufficient documentation

## 2019-01-28 DIAGNOSIS — N280 Ischemia and infarction of kidney: Secondary | ICD-10-CM | POA: Insufficient documentation

## 2019-01-28 DIAGNOSIS — R109 Unspecified abdominal pain: Secondary | ICD-10-CM | POA: Insufficient documentation

## 2019-01-28 LAB — CBC WITH DIFFERENTIAL/PLATELET
Abs Immature Granulocytes: 0.01 10*3/uL (ref 0.00–0.07)
Basophils Absolute: 0.1 10*3/uL (ref 0.0–0.1)
Basophils Relative: 1 %
Eosinophils Absolute: 0.1 10*3/uL (ref 0.0–0.5)
Eosinophils Relative: 2 %
HCT: 44.4 % (ref 36.0–46.0)
Hemoglobin: 13.7 g/dL (ref 12.0–15.0)
Immature Granulocytes: 0 %
Lymphocytes Relative: 25 %
Lymphs Abs: 1.4 10*3/uL (ref 0.7–4.0)
MCH: 24.7 pg — ABNORMAL LOW (ref 26.0–34.0)
MCHC: 30.9 g/dL (ref 30.0–36.0)
MCV: 80 fL (ref 80.0–100.0)
Monocytes Absolute: 0.4 10*3/uL (ref 0.1–1.0)
Monocytes Relative: 7 %
Neutro Abs: 3.5 10*3/uL (ref 1.7–7.7)
Neutrophils Relative %: 65 %
Platelets: 286 10*3/uL (ref 150–400)
RBC: 5.55 MIL/uL — ABNORMAL HIGH (ref 3.87–5.11)
RDW: 17.4 % — ABNORMAL HIGH (ref 11.5–15.5)
WBC: 5.4 10*3/uL (ref 4.0–10.5)
nRBC: 0 % (ref 0.0–0.2)

## 2019-01-28 LAB — COMPREHENSIVE METABOLIC PANEL
ALT: 9 U/L (ref 0–44)
AST: 15 U/L (ref 15–41)
Albumin: 3.4 g/dL — ABNORMAL LOW (ref 3.5–5.0)
Alkaline Phosphatase: 83 U/L (ref 38–126)
Anion gap: 8 (ref 5–15)
BUN: 11 mg/dL (ref 8–23)
CO2: 22 mmol/L (ref 22–32)
Calcium: 9.4 mg/dL (ref 8.9–10.3)
Chloride: 105 mmol/L (ref 98–111)
Creatinine, Ser: 1.2 mg/dL — ABNORMAL HIGH (ref 0.44–1.00)
GFR calc Af Amer: 55 mL/min — ABNORMAL LOW (ref >60)
GFR calc non Af Amer: 47 mL/min — ABNORMAL LOW (ref >60)
Glucose, Bld: 122 mg/dL — ABNORMAL HIGH (ref 70–99)
Potassium: 3.7 mmol/L (ref 3.5–5.1)
Sodium: 135 mmol/L (ref 135–145)
Total Bilirubin: 0.6 mg/dL (ref 0.3–1.2)
Total Protein: 7.8 g/dL (ref 6.5–8.1)

## 2019-01-28 LAB — URINALYSIS, ROUTINE W REFLEX MICROSCOPIC
Bilirubin Urine: NEGATIVE
Glucose, UA: NEGATIVE mg/dL
Hgb urine dipstick: NEGATIVE
Ketones, ur: NEGATIVE mg/dL
Leukocytes,Ua: NEGATIVE
Nitrite: NEGATIVE
Protein, ur: NEGATIVE mg/dL
Specific Gravity, Urine: 1.021 (ref 1.005–1.030)
pH: 5 (ref 5.0–8.0)

## 2019-01-28 MED ORDER — HYDROCODONE-ACETAMINOPHEN 5-325 MG PO TABS: 1.0000 | ORAL_TABLET | Freq: Four times a day (QID) | ORAL | 0 refills | Status: DC | PRN

## 2019-01-28 MED ORDER — FENTANYL CITRATE (PF) 100 MCG/2ML IJ SOLN
100.0000 ug | Freq: Once | INTRAMUSCULAR | Status: AC
  Administered 2019-01-28: 100 ug via INTRAVENOUS
  Filled 2019-01-28: qty 2

## 2019-01-28 MED ORDER — HYDROCODONE-ACETAMINOPHEN 5-325 MG PO TABS
1.0000 | ORAL_TABLET | Freq: Once | ORAL | Status: AC
  Administered 2019-01-28: 1 via ORAL
  Filled 2019-01-28: qty 1

## 2019-01-28 MED ORDER — IOHEXOL 300 MG/ML  SOLN
100.0000 mL | Freq: Once | INTRAMUSCULAR | Status: AC | PRN
  Administered 2019-01-28: 100 mL via INTRAVENOUS

## 2019-01-28 MED ORDER — FENTANYL CITRATE (PF) 100 MCG/2ML IJ SOLN
50.0000 ug | Freq: Once | INTRAMUSCULAR | Status: AC
  Administered 2019-01-28: 50 ug via INTRAVENOUS
  Filled 2019-01-28: qty 2

## 2019-01-28 NOTE — ED Notes (Signed)
Patient transported to Ultrasound 

## 2019-01-28 NOTE — ED Triage Notes (Signed)
Pt. Stated, I started having right side pain since yesterday off and on. Denies any other symptoms

## 2019-01-28 NOTE — ED Provider Notes (Addendum)
[facility] Provider Note   CSN: [id number] Arrival date & time: 01/28/19  1038    History   Chief Complaint Chief Complaint  Patient presents with  . Abdominal Cramping    right    HPI Mercedes [name] is a 63 y.o. female.     HPI Patient with right-sided flank and abdominal pain.  Began yesterday.  On and off.  No dysuria.  No nausea or vomiting.  No dysuria.  No constipation.  Had recent admission to hospital for bilateral pyelonephritis.  States no fevers now.  She is on Eliquis for atrial flutter.  No difficulty breathing.  No blood in the stool.  States she has not really had pains like this before.  Eating does not change the pain for either better or worse. Past Medical History:  Diagnosis Date  . Abnormal LFTs    a. in the past when on Lipitor  . Anemia    as a young woman  . Anxiety   . Arnold-Chiari malformation, type I (HCC)    MRI brain 02/2010  . CAD (coronary artery disease)    a. NSTEMI 07/2009: LHC - D2 40%, LAD irreg., EF 50% with apical AK (tako-tsubo CM);  b. Inf STEMI (04/2013): Tx Promus DES to CFX;  c. 08/2013 Lexi CL: No ischemia, dist ant/ap/inf-ap infarct, EF  d. 06/2016 NSTEMI with LM disease: s/p CABG on 07/17/2016 w/ LIMA-LAD, SVG-OM, and SVG-dRCA.  [name]. DEPRESSION   . Dizziness    ? CVA 01/2010 - carotid dopplers with no ICA stenosis  . GERD (gastroesophageal reflux disease)   . HYPERLIPIDEMIA   . HYPERTENSION   . MI (myocardial infarction) (HCC)   . PAD (peripheral artery disease) (HCC)    s/p L fem pop 11/2013 in setting of 1st toe osteo/gangrene  . Paroxysmal atrial fibrillation (HCC)    a. anticoagulated with Eliquis 10/2014  . RA (rheumatoid arthritis) (HCC)    prior tx by Dr. Dareen PianoAnderson  . Sjogren's syndrome (HCC) 06/02/13   pt denies this (12/01/13)  . SLE (systemic lupus erythematosus) (HCC) dx 05/2013   follows with rheum Dareen Piano(Anderson)  . Tachy-brady syndrome (HCC)    a. s/p STJ Leadless pacemaker  11-04-2014 Dr Johney FrameAllred  . 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 Hyperbilirubinemia 10/21/2018  . Acute pyelonephritis 10/20/2018  . Acute renal failure superimposed on stage 2 chronic kidney disease (HCC) 10/20/2018  . Sepsis (HCC) 10/20/2018  . Diabetes (HCC) 04/01/2017  . Hyperthyroidism 02/21/2017  . Low mean corpuscular volume (MCV) 09/30/2016  . Coronary artery disease involving native heart without angina pectoris   . Hx of CABG 07/17/2016  . Paroxysmal atrial fibrillation (HCC) 07/12/2016  . NSTEMI (non-ST elevated myocardial infarction) (HCC) 07/11/2016  . History of arterial bypass of lower extremity 12/30/2015  . Claudication of right lower extremity (HCC) 12/30/2015  . Phantom limb pain-Left great toe 04/29/2015  . Seizure (HCC)   . Atrial flutter with rapid ventricular response vs. SVT 10/27/2014    Class: Acute  . 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 PAD (peripheral artery disease) (HCC) 12/02/2013  . SLE (systemic lupus erythematosus) (HCC)   . CAD - S/P Takotsubo MI in 2000, CFX DES Sept 2014 05/25/2013  . Acute MI, inferior wall (HCC) 05/19/2013  . 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  Tachycardia-bradycardia (HCC) 03/09/2010  . P[facility]- [facility] 10/29/2009  . Dyslipidemia 08/26/2009  . Essential hypertension 08/26/2009  . CORONARY ATHEROSCLEROSIS NATIVE CORONARY ARTERY 08/26/2009  . 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 Rheumatoid arthritis (HCC) 08/26/2009    Past Surgical History:  Procedure Laterality Date  . ABDOMINAL AORTAGRAM N/A 11/24/2013   Procedure: ABDOMINAL AORTAGRAM WITH BILATERAL RUNOFF WITH POSSIBLE INTERVENTION;  Surgeon: Chuck Hinthristopher S Dickson, MD;  [facility];  Service: Cardiovascular;  Laterality: N/A;  . AMPUTATION Left 12/02/2013   Procedure:  LEFT 1ST TOE AMPUTATION;  Surgeon: Sherren Kernsharles E Fields, MD;  [facility];  Service: Vascular;  Laterality: Left;  . CARDIAC CATHETERIZATION N/A 07/12/2016   Procedure: Left Heart Cath and Coronary Angiography;  Surgeon: Lennette Biharihomas A Kelly, MD;  [facility];  Service: Cardiovascular;  Laterality: N/A;  . CARDIAC CATHETERIZATION N/A 07/14/2016   Procedure: Intravascular Ultrasound/IVUS;  Surgeon: Yvonne Kendallhristopher End, MD;  Location: [facility] INVASIVE CV LAB;  Service: Cardiovascular;  Laterality: N/A;  . COLONOSCOPY    . CORONARY ANGIOPLASTY  04/2013  . CORONARY ARTERY BYPASS GRAFT N/A 07/17/2016   Procedure: CORONARY ARTERY BYPASS GRAFTING (CABG)x3 using right greater saphenous vein harvested endoscopiclly and left internal mammary artery;  Surgeon: Kerin PernaPeter Van Trigt, MD;  [facility];  Service: Open Heart Surgery;  Laterality: N/A;  . FEMORAL-POPLITEAL BYPASS GRAFT Left 12/02/2013   Procedure:  LEFT FEMORAL-BELOW KNEE POPLITEAL ARTERY BYPASS GRAFT;  Surgeon: Sherren Kernsharles E Fields, MD;  [facility];  Service: Vascular;  Laterality: Left;  . LEFT HEART CATHETERIZATION WITH CORONARY ANGIOGRAM N/A 05/19/2013   Procedure: LEFT HEART CATHETERIZATION WITH CORONARY ANGIOGRAM;  Surgeon: Micheline ChapmanMichael D Cooper, MD;  [facility];  Service: Cardiovascular;  Laterality: N/A;  . LEFT HEART CATHETERIZATION WITH CORONARY ANGIOGRAM N/A 10/28/2014   Procedure: LEFT HEART CATHETERIZATION WITH CORONARY ANGIOGRAM;  Surgeon: Iran OuchMuhammad A Arida, MD;  Location: [facility] CATH LAB;  Service: Cardiovascular;  Laterality: N/A;  . PERCUTANEOUS CORONARY STENT INTERVENTION (PCI-S)  05/19/2013   Procedure: PERCUTANEOUS CORONARY STENT INTERVENTION (PCI-S);  Surgeon: Micheline ChapmanMichael D Cooper, MD;  [facility];  Service: Cardiovascular;;  . PERMANENT PACEMAKER INSERTION N/A 11/04/2014   STJ Leadless pacemaker implanted by Dr Johney FrameAllred for tachy/brady  . TEE WITHOUT CARDIOVERSION N/A 07/17/2016   Procedure: TRANSESOPHAGEAL ECHOCARDIOGRAM  (TEE);  Surgeon: Kerin PernaPeter Van Trigt, MD;  [facility];  Service: Open Heart Surgery;  Laterality: N/A;  . TUBAL LIGATION       OB History   No obstetric history on file.      Home Medications    Prior to Admission medications   Medication Sig Start Date End Date Taking? Authorizing Provider  ELIQUIS 5 MG TABS tablet TAKE 1 TABLET BY MOUTH TWICE DAILY Patient taking differently: Take 5 mg by mouth 2 (two) times daily.  04/24/18   Lewayne Buntingrenshaw, Brian S, MD  HYDROcodone-acetaminophen (NORCO/VICODIN) 5-325 MG tablet Take 1-2 tablets by mouth every 6 (six) hours as needed for moderate pain. 01/28/19   Benjiman CorePickering, Tahlor Berenguer, MD  lisinopril (PRINIVIL,ZESTRIL) 5 MG tablet TAKE 1 TABLET(5 MG) BY MOUTH DAILY. FOLLOW UP APPOINTMENT FOR FURTHER REFILLS Patient taking differently: Take 5 mg by mouth daily as needed (if Systolic reading is 180 or greater).  04/24/18   Lewayne Buntingrenshaw, Brian S, MD  metoprolol tartrate (LOPRESSOR) 25 MG tablet Take 0.5 tablets (12.5 mg total) by mouth 2 (two) times daily. 04/24/18   Lewayne Buntingrenshaw, Brian S, MD  nitroGLYCERIN (NITROSTAT) 0.4 MG SL tablet  Place 1 tablet (0.4 mg total) under the tongue every 5 (five) minutes as needed for chest pain. 10/09/18   Marily Lente, NP    Family History Family History  Problem Relation Age of Onset  . Arthritis Mother   . Emphysema Mother   . Arthritis Father   . COPD Father   . Heart disease Father   . Diabetes Paternal Aunt        paternal Aunts  . Heart disease Paternal Aunt   . Thyroid disease Neg Hx     Social History Social History   Tobacco Use  . Smoking status: Former Smoker    Types: Cigarettes    Last attempt to quit: 05/19/2016    Years since quitting: 2.6  . Smokeless tobacco: Never Used  . Tobacco comment:    Substance Use Topics  . Alcohol use: No    Alcohol/week: 0.0 standard drinks    Frequency: Never  . Drug use: No     Allergies   Brilinta [ticagrelor] and Latex   Review of Systems Review of Systems   Constitutional: Negative for appetite change.  HENT: Negative for congestion.   Respiratory: Negative for shortness of breath.   Cardiovascular: Negative for chest pain.  Gastrointestinal: Positive for abdominal pain.  Genitourinary: Positive for flank pain.  Musculoskeletal: Negative for arthralgias.  Skin: Negative for rash.  Neurological: Negative for weakness.     Physical Exam Updated Vital Signs BP (!) 141/72 (BP Location: Right Arm)   Pulse 71   Temp 97.8 F (36.6 C) (Oral)   Resp 16   SpO2 100%   Physical Exam Vitals signs and nursing note reviewed.  HENT:     Head: Normocephalic.  Eyes:     Pupils: Pupils are equal, round, and reactive to light.  Neck:     Musculoskeletal: Neck supple.  Cardiovascular:     Rate and Rhythm: Regular rhythm.     Heart sounds: Normal heart sounds.  Pulmonary:     Effort: Pulmonary effort is normal.  Abdominal:     Comments: Tenderness to right side of the abdomen.  Also some right CVA tenderness.  No hernia.  No rebound or guarding.  Neurological:     Mental Status: She is alert.      ED Treatments / Results  Labs (all labs ordered are listed, but only abnormal results are displayed) Labs Reviewed  URINALYSIS, ROUTINE W REFLEX MICROSCOPIC - Abnormal; Notable for the following components:      Result Value   APPearance HAZY (*)    All other components within normal limits  CBC WITH DIFFERENTIAL/PLATELET - Abnormal; Notable for the following components:   RBC 5.55 (*)    MCH 24.7 (*)    RDW 17.4 (*)    All other components within normal limits  COMPREHENSIVE METABOLIC PANEL - Abnormal; Notable for the following components:   Glucose, Bld 122 (*)    Creatinine, Ser 1.20 (*)    Albumin 3.4 (*)    GFR calc non Af Amer 47 (*)    GFR calc Af Amer 55 (*)    All other components within normal limits    EKG None  Radiology Ct Abdomen Pelvis W Contrast  Result Date: 01/28/2019 CLINICAL DATA:  Right lower quadrant pain,  appendicitis suspected EXAM: CT ABDOMEN AND PELVIS WITH CONTRAST TECHNIQUE: Multidetector CT imaging of the abdomen and pelvis was performed using the standard protocol following bolus administration of intravenous contrast. CONTRAST:  OMNIPAQUE IOHEXOL  300 MG/ML  SOLN COMPARISON:  10/20/2018 FINDINGS: Lower chest: No acute abnormality. Hepatobiliary: No focal liver abnormality is seen. No gallstones or gallbladder wall thickening. Unchanged enlargement of the common bile duct up to 1.0 cm. No obstructing calculus or lesion identified to the ampulla. Pancreas: Unremarkable. No pancreatic ductal dilatation or surrounding inflammatory changes. Spleen: Normal in size without focal abnormality. Adrenals/Urinary Tract: Adrenal glands are unremarkable. 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 Bladder is unremarkable. Stomach/Bowel: Stomach is within normal limits. Appendix appears normal. The colon is decompressed although mildly thickened with adjacent inflammatory stranding. Vascular/Lymphatic: Severe mixed calcific atherosclerosis. The abdominal branch vessel origins are grossly patent on this non angiographic examination. No enlarged abdominal or pelvic lymph nodes. Reproductive: No mass or other abnormality. Other: No abdominal wall hernia or abnormality. No abdominopelvic ascites. Musculoskeletal: No acute or significant osseous findings. IMPRESSION: 1. 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 2. The colon is decompressed although mildly thickened with adjacent inflammatory stranding. Findings are consistent with nonspecific infectious, inflammatory, or ischemic colitis. 3.  Severe mixed calcific atherosclerosis. 4. Unchanged enlargement of the common bile duct up to  1.0 cm. No obstructing calculus or lesion identified to the ampulla. 5.  Normal appendix. Electronically Signed   By: Lauralyn Primes M.D.   On: 01/28/2019 15:29   US Renal  Result Date: 01/28/2019 CLINICAL DATA:  Right flank pain. EXAM: RENAL / URINARY TRACT ULTRASOUND COMPLETE COMPARISON:  CT scan dated 10/20/2018 FINDINGS: Right Kidney: Renal measurements: 10.4 x 3.7 x 9.8 cm = volume: 96 mL . Echogenicity within normal limits. No mass or hydronephrosis visualized. Left Kidney: Renal measurements: 9.5 x 3.9 x 4.3 cm = volume: 84 mL. Echogenicity within normal limits. No mass or hydronephrosis visualized. Bladder: Appears normal for degree of bladder distention. IMPRESSION: Normal appearing kidneys bilaterally. Electronically Signed   By: Francene Boyers M.D.   On: 01/28/2019 11:38    Procedures Procedures (including critical care time)  Medications Ordered in ED Medications  fentaNYL (SUBLIMAZE) injection 50 mcg (50 mcg Intravenous Given 01/28/19 1110)  fentaNYL (SUBLIMAZE) injection 100 mcg (100 mcg Intravenous Given 01/28/19 1249)  iohexol (OMNIPAQUE) 300 MG/ML solution 100 mL (100 mLs Intravenous Contrast Given 01/28/19 1511)  HYDROcodone-acetaminophen (NORCO/VICODIN) 5-325 MG per tablet 1 tablet (1 tablet Oral Given 01/28/19 1548)     Initial Impression / Assessment and Plan / ED Course  I have reviewed the triage vital signs and the nursing notes.  Pertinent labs & imaging results that were available during my care of the patient were reviewed by me and considered in my medical decision making (see chart for details).        Patient with abdominal/flank pain.  Recent UTI.  Urine however does not show infection.  She is also on anticoagulation.  Will get CT scan to further evaluate.  [place] does not show hydronephrosis.  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  Discussed with Dr. Kathrynn Running.  Likely due to patient's atrial fibrillation or vascular disease as opposed to infection.  Will give pain  medicines and outpatient follow-up.  Discharge home.  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

## 2019-01-28 NOTE — Discharge Instructions (Signed)
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  That means carted your kidney lacked blood flow.  Continue your Eliquis.  Follow-up with urology.  Take the pain meds as needed.

## 2019-01-28 NOTE — ED Notes (Signed)
Patient verbalizes understanding of discharge instructions. Opportunity for questioning and answers were provided. Armband removed by staff, pt discharged from ED.  

## 2019-01-30 NOTE — Progress Notes (Signed)
Subjective:    Patient ID: Mercedes [name], female    DOB: 1956-05-16, 63 y.o.   MRN: [id number]  HPI The patient is here for follow up from the ED.   ED 6/2 for abdominal cramping on her right side.   She went to the emergency room with right-sided flank and abdominal pain.  It started the day prior and it was intermittent.  She denied any dysuria, constipation, fever, shortness of breath, blood in the stool nausea and vomiting.  She did have a recent admission for bilateral pyelonephritis ( 10/20/18).  She takes Eliquis for atrial flutter.  She has never had pain this severe before.  There is no relation of the pain to eating.  In the emergency room she was tender on the right side of her abdomen and had some right CVA tenderness.  There is no rebound or guarding.  Urinalysis did not reveal an infection.  Creatinine was 1.2, GFR 55.  Other blood work within normal limits.  She received fentanyl and Vicodin for her pain.  Renal ultrasound showed normal kidneys.  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  This was new to compared to her CT scan 10/20/2018.  CT scan also showed decompressed colon with mild thickening and adjacent inflammatory stranding.  Findings were consistent with nonspecific infectious, inflammatory or ischemic colitis.  She had severe mixed calcific atherosclerosis.  She also had an unchanged enlargement of the common bile duct up to 1 cm.  No obstructing calculus or lesion identified.  Her appendix was normal.  Dr. Kathrynn Running was consulted and was thought that this was likely related to the patient's atrial fibrillation or vascular disease as opposed to an infection.  She was discharged home with Vicodin and advised urology follow-up.  In February she did have acute bilateral pyelonephritis and was hospitalized for sepsis.  She missed her metoprolol and eliquis for about one month in March - she forgot to take  it when she went on vacation.  She went on vacation 3/10.  She did not have any problems during this time and felt great.  Right flank pain: Few days before she went to the emergency room on 6/2 she does recall that her urine was very dark.  She stopped drinking soda increased her water and that the urine color improved.  She started having the flank pain around that time.  She still has some soreness, but it is slightly better.  She took the vicodin only twice - it makes her too drowsy.  She has been only taking tylenol.  Pain has not really been getting better..  She thinks her urine color looks normal.  She denies diarrhea or constipation.  There is no blood in her stool.    Epigastric pain, weight loss: Around the time that her right flank pain started she started experiencing pain in epigastric region.  She gets full easily and has not been able to eat as much as usual.  She was very concerned because she has not noticed that she has been losing weight and she thinks this is been really over the past month.  She has not changed her diet or her exercise.  When she was here about 3 months ago she weighed 11 pounds more.    Medications and allergies reviewed with patient and updated if appropriate.  Patient Active Problem List   Diagnosis Date Noted  . Hyperbilirubinemia  10/21/2018  . Acute pyelonephritis 10/20/2018  . Acute renal failure superimposed on stage 2 chronic kidney disease (HCC) 10/20/2018  . Sepsis (HCC) 10/20/2018  . Diabetes (HCC) 04/01/2017  . Hyperthyroidism 02/21/2017  . Low mean corpuscular volume (MCV) 09/30/2016  . Coronary artery disease involving native heart without angina pectoris   . Hx of CABG 07/17/2016  . Paroxysmal atrial fibrillation (HCC) 07/12/2016  . NSTEMI (non-ST elevated myocardial infarction) (HCC) 07/11/2016  . History of arterial bypass of lower extremity 12/30/2015  . Claudication of right lower extremity (HCC) 12/30/2015  . Phantom limb pain-Left  great toe 04/29/2015  . Seizure (HCC)   . Atrial flutter with rapid ventricular response vs. SVT 10/27/2014    Class: Acute  . 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 PAD (peripheral artery disease) (HCC) 12/02/2013  . SLE (systemic lupus erythematosus) (HCC)   . CAD - S/P Takotsubo MI in 2000, CFX DES Sept 2014 05/25/2013  . Acute MI, inferior wall (HCC) 05/19/2013  . 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 Tachycardia-bradycardia (HCC) 03/09/2010  . [facility] 10/29/2009  . Dyslipidemia 08/26/2009  . Essential hypertension 08/26/2009  . CORONARY ATHEROSCLEROSIS NATIVE CORONARY ARTERY 08/26/2009  . 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 Rheumatoid arthritis (HCC) 08/26/2009    Current Outpatient Medications on File Prior to Visit  Medication Sig Dispense Refill  . ELIQUIS 5 MG TABS tablet TAKE 1 TABLET BY MOUTH TWICE DAILY (Patient taking differently: Take 5 mg by mouth 2 (two) times daily. ) 180 tablet 3  . lisinopril (PRINIVIL,ZESTRIL) 5 MG tablet TAKE 1 TABLET(5 MG) BY MOUTH DAILY. FOLLOW UP APPOINTMENT FOR FURTHER REFILLS (Patient taking differently: Take 5 mg by mouth daily as needed (if Systolic reading is 180 or greater). ) 90 tablet 3  . metoprolol tartrate (LOPRESSOR) 25 MG tablet Take 0.5 tablets (12.5 mg total) by mouth 2 (two) times daily. 180 tablet 3  . nitroGLYCERIN (NITROSTAT) 0.4 MG SL tablet Place 1 tablet (0.4 mg total) under the tongue every 5 (five) minutes as needed for chest pain. 25 tablet 3   No current facility-administered medications on file prior to visit.     Past Medical History:  Diagnosis Date  . Abnormal LFTs    a. in the past when on Lipitor  . Anemia    as a young woman  . Anxiety   . Arnold-Chiari malformation, type I (HCC)    MRI brain 02/2010  . CAD (coronary artery disease)    a. NSTEMI 07/2009: LHC - D2 40%, LAD irreg., EF 50% with apical AK (tako-tsubo CM);  b. Inf  STEMI (04/2013): Tx Promus DES to CFX;  c. 08/2013 Lexi CL: No ischemia, dist ant/ap/inf-ap infarct, EF  d. 06/2016 NSTEMI with LM disease: s/p CABG on 07/17/2016 w/ LIMA-LAD, SVG-OM, and SVG-dRCA.  [name]. DEPRESSION   . Dizziness    ? CVA 01/2010 - carotid dopplers with no ICA stenosis  . GERD (gastroesophageal reflux disease)   . HYPERLIPIDEMIA   . HYPERTENSION   . MI (myocardial infarction) (HCC)   . PAD (peripheral artery disease) (HCC)    s/p L fem pop 11/2013 in setting of 1st toe osteo/gangrene  . Paroxysmal atrial fibrillation (HCC)    a. anticoagulated with Eliquis 10/2014  . RA (rheumatoid arthritis) (HCC)    prior tx by Dr. Dareen PianoAnderson  . Sjogren's syndrome (HCC) 06/02/13   pt denies this (12/01/13)  . SLE (systemic lupus erythematosus) (HCC) dx  05/2013   follows with rheum Dareen Piano([facility])  . Tachy-brady syndrome (HCC)    a. s/p STJ Leadless pacemaker 11-04-2014 Dr Johney FrameAllred  . 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 ABDOMINAL AORTAGRAM N/A 11/24/2013   Procedure: ABDOMINAL AORTAGRAM WITH BILATERAL RUNOFF WITH POSSIBLE INTERVENTION;  Surgeon: Chuck Hinthristopher S Dickson, MD;  [facility];  Service: Cardiovascular;  Laterality: N/A;  . AMPUTATION Left 12/02/2013   Procedure: LEFT 1ST TOE AMPUTATION;  Surgeon: Sherren Kernsharles E Fields, MD;  [facility];  Service: Vascular;  Laterality: Left;  . CARDIAC CATHETERIZATION N/A 07/12/2016   Procedure: Left Heart Cath and Coronary Angiography;  Surgeon: Lennette Biharihomas A Kelly, MD;  [facility];  Service: Cardiovascular;  Laterality: N/A;  . CARDIAC CATHETERIZATION N/A 07/14/2016   Procedure: Intravascular Ultrasound/IVUS;  Surgeon: Yvonne Kendallhristopher End, MD;  Location: [facility] INVASIVE CV LAB;  Service: Cardiovascular;  Laterality: N/A;  . COLONOSCOPY    . CORONARY ANGIOPLASTY  04/2013  . CORONARY ARTERY BYPASS GRAFT N/A 07/17/2016    Procedure: CORONARY ARTERY BYPASS GRAFTING (CABG)x3 using right greater saphenous vein harvested endoscopiclly and left internal mammary artery;  Surgeon: Kerin PernaPeter Van Trigt, MD;  [facility];  Service: Open Heart Surgery;  Laterality: N/A;  . FEMORAL-POPLITEAL BYPASS GRAFT Left 12/02/2013   Procedure:  LEFT FEMORAL-BELOW KNEE POPLITEAL ARTERY BYPASS GRAFT;  Surgeon: Sherren Kernsharles E Fields, MD;  [facility];  Service: Vascular;  Laterality: Left;  . LEFT HEART CATHETERIZATION WITH CORONARY ANGIOGRAM N/A 05/19/2013   Procedure: LEFT HEART CATHETERIZATION WITH CORONARY ANGIOGRAM;  Surgeon: Micheline ChapmanMichael D Cooper, MD;  [facility];  Service: Cardiovascular;  Laterality: N/A;  . LEFT HEART CATHETERIZATION WITH CORONARY ANGIOGRAM N/A 10/28/2014   Procedure: LEFT HEART CATHETERIZATION WITH CORONARY ANGIOGRAM;  Surgeon: Iran OuchMuhammad A Arida, MD;  Location: [facility] CATH LAB;  Service: Cardiovascular;  Laterality: N/A;  . PERCUTANEOUS CORONARY STENT INTERVENTION (PCI-S)  05/19/2013   Procedure: PERCUTANEOUS CORONARY STENT INTERVENTION (PCI-S);  Surgeon: Micheline ChapmanMichael D Cooper, MD;  [facility];  Service: Cardiovascular;;  . PERMANENT PACEMAKER INSERTION N/A 11/04/2014   STJ Leadless pacemaker implanted by Dr Johney FrameAllred for tachy/brady  . TEE WITHOUT CARDIOVERSION N/A 07/17/2016   Procedure: TRANSESOPHAGEAL ECHOCARDIOGRAM (TEE);  Surgeon: Kerin PernaPeter Van Trigt, MD;  [facility];  Service: Open Heart Surgery;  Laterality: N/A;  . TUBAL LIGATION      Social History   Socioeconomic History  . Marital status: Single    Spouse name: Not on file  . Number of children: 3  . Years of education: Not on file  . Highest education level: Not on file  Occupational History  . Not on file  Social Needs  . Financial resource strain: Not on file  . Food insecurity:    Worry: Not on file    Inability: Not on file  . Transportation needs:    Medical: Not on file    Non-medical: Not on file  Tobacco Use  . Smoking status:  Former Smoker    Types: Cigarettes    Last attempt to quit: 05/19/2016    Years since quitting: 2.7  . Smokeless tobacco: Never Used  . Tobacco comment:    Substance and Sexual Activity  . Alcohol use: No    Alcohol/week: 0.0 standard drinks    Frequency: Never  . Drug use: No  . Sexual activity: Not on  file  Lifestyle  . Physical activity:    Days per week: Not on file    Minutes per session: Not on file  . Stress: Not on file  Relationships  . Social connections:    Talks on phone: Not on file    Gets together: Not on file    Attends religious service: Not on file    Active member of club or organization: Not on file    Attends meetings of clubs or organizations: Not on file    Relationship status: Not on file  Other Topics Concern  . Not on file  Social History Narrative   Lives alone.    Family History  Problem Relation Age of Onset  . Arthritis Mother   . Emphysema Mother   . Arthritis Father   . COPD Father   . Heart disease Father   . Diabetes Paternal Aunt        paternal Aunts  . Heart disease Paternal Aunt   . Thyroid disease Neg Hx     Review of Systems  Constitutional: Positive for unexpected weight change. Negative for appetite change, chills and fever.  Respiratory: Positive for shortness of breath (chronic, no change). Negative for cough and wheezing.   Cardiovascular: Positive for palpitations. Negative for chest pain and leg swelling.  Gastrointestinal: Positive for abdominal pain (soemtimes in epigastric region). Negative for blood in stool, constipation, diarrhea and nausea.  Genitourinary: Positive for flank pain (right flank). Negative for dysuria and hematuria.  Skin: Negative for color change and rash.  Neurological: Positive for headaches (rare). Negative for light-headedness.       Objective:   Vitals:   01/31/19 1402  BP: (!) 142/88  Pulse: 92  Resp: 16  Temp: 99.3 F (37.4 C)  SpO2: 99%   BP Readings from Last 3 Encounters:   01/31/19 (!) 142/88  01/28/19 (!) 145/81  11/05/18 (!) 185/100   Wt Readings from Last 3 Encounters:  01/31/19 109 lb (49.4 kg)  11/05/18 120 lb 12.8 oz (54.8 kg)  10/20/18 121 lb (54.9 kg)   Body mass index is 18.14 kg/m.    Lab Results  Component Value Date   WBC 10.1 01/31/2019   HGB 13.9 01/31/2019   HCT 42.3 01/31/2019   PLT 341.0 01/31/2019   GLUCOSE 109 (H) 01/31/2019   CHOL 168 04/01/2018   TRIG 111 04/01/2018   HDL 54 04/01/2018   LDLCALC 92 04/01/2018   ALT 13 01/31/2019   AST 16 01/31/2019   NA 131 (L) 01/31/2019   K 4.0 01/31/2019   CL 98 01/31/2019   CREATININE 1.09 01/31/2019   BUN 13 01/31/2019   CO2 24 01/31/2019   TSH 0.75 01/31/2019   INR 1.38 08/01/2016   HGBA1C 6.4 11/05/2018    CT ABDOMEN PELVIS W CONTRAST CLINICAL DATA:  Right lower quadrant pain, appendicitis suspected  EXAM: CT ABDOMEN AND PELVIS WITH CONTRAST  TECHNIQUE: Multidetector CT imaging of the abdomen and pelvis was performed using the standard protocol following bolus administration of intravenous contrast.  CONTRAST:  OMNIPAQUE IOHEXOL 300 MG/ML  SOLN  COMPARISON:  10/20/2018  FINDINGS: Lower chest: No acute abnormality.  Hepatobiliary: No focal liver abnormality is seen. No gallstones or gallbladder wall thickening. Unchanged enlargement of the common bile duct up to 1.0 cm. No obstructing calculus or lesion identified to the ampulla.  Pancreas: Unremarkable. No pancreatic ductal dilatation or surrounding inflammatory changes.  Spleen: Normal in size without focal abnormality.  Adrenals/Urinary Tract: Adrenal  glands are unremarkable. 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 Bladder is unremarkable.  Stomach/Bowel: Stomach is within normal limits. Appendix appears normal. The colon is decompressed although mildly thickened with adjacent  inflammatory stranding.  Vascular/Lymphatic: Severe mixed calcific atherosclerosis. The abdominal branch vessel origins are grossly patent on this non angiographic examination. No enlarged abdominal or pelvic lymph nodes.  Reproductive: No mass or other abnormality.  Other: No abdominal wall hernia or abnormality. No abdominopelvic ascites.  Musculoskeletal: No acute or significant osseous findings.  IMPRESSION: 1. 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  2. The colon is decompressed although mildly thickened with adjacent inflammatory stranding. Findings are consistent with nonspecific infectious, inflammatory, or ischemic colitis.  3.  Severe mixed calcific atherosclerosis.  4. Unchanged enlargement of the common bile duct up to 1.0 cm. No obstructing calculus or lesion identified to the ampulla.  5.  Normal appendix.  Electronically Signed   By: Lauralyn Primes M.D.   On: 01/28/2019 15:29 US Renal CLINICAL DATA:  Right flank pain.  EXAM: RENAL / URINARY TRACT ULTRASOUND COMPLETE  COMPARISON:  CT scan dated 10/20/2018  FINDINGS: Right Kidney:  Renal measurements: 10.4 x 3.7 x 9.8 cm = volume: 96 mL . Echogenicity within normal limits. No mass or hydronephrosis visualized.  Left Kidney:  Renal measurements: 9.5 x 3.9 x 4.3 cm = volume: 84 mL. Echogenicity within normal limits. No mass or hydronephrosis visualized.  Bladder:  Appears normal for degree of bladder distention.  IMPRESSION: Normal appearing kidneys bilaterally.  Electronically Signed   By: Francene Boyers M.D.   On: 01/28/2019 11:38    Physical Exam    Constitutional: Appears thin-has noticeably lost weight since her last visit. No distress.  HENT:  Head: Normocephalic and atraumatic.  Neck: Neck supple. No tracheal deviation present. No thyromegaly  present.  No cervical lymphadenopathy Cardiovascular: Normal rate, regular rhythm and normal heart sounds.   No murmur heard. No carotid bruit .  No edema Pulmonary/Chest: Effort normal and breath sounds normal. No respiratory distress. No has no wheezes. No rales. Abdomen: Soft, nondistended.  Tenderness from suprapubic region to right flank and right CVA region.  No guarding or rebound.  Mild epigastric tenderness with palpation.  No guarding or rebound.  No tenderness elsewhere.  No hernias.  No masses. Skin: Skin is warm and dry. Not diaphoretic.  Psychiatric: Normal mood and affect. Behavior is normal.      Assessment & Plan:    See Problem List for Assessment and Plan of chronic medical problems.

## 2019-01-31 ENCOUNTER — Other Ambulatory Visit: Payer: Self-pay

## 2019-01-31 ENCOUNTER — Ambulatory Visit (INDEPENDENT_AMBULATORY_CARE_PROVIDER_SITE_OTHER): Payer: Medicare Other | Admitting: Internal Medicine

## 2019-01-31 ENCOUNTER — Encounter: Payer: Self-pay | Admitting: Internal Medicine

## 2019-01-31 ENCOUNTER — Ambulatory Visit (INDEPENDENT_AMBULATORY_CARE_PROVIDER_SITE_OTHER)
Admission: RE | Admit: 2019-01-31 | Discharge: 2019-01-31 | Disposition: A | Discharge status: Home / Self Care | Payer: Medicare Other | Source: Ambulatory Visit | Attending: Internal Medicine | Admitting: Internal Medicine

## 2019-01-31 ENCOUNTER — Other Ambulatory Visit (INDEPENDENT_AMBULATORY_CARE_PROVIDER_SITE_OTHER): Payer: Medicare Other

## 2019-01-31 VITALS — BP 142/88 | HR 92 | Temp 99.3°F | Resp 16 | Ht 65.0 in | Wt 109.0 lb

## 2019-01-31 DIAGNOSIS — R1013 Epigastric pain: Secondary | ICD-10-CM

## 2019-01-31 DIAGNOSIS — R0600 Dyspnea, unspecified: Secondary | ICD-10-CM

## 2019-01-31 DIAGNOSIS — R634 Abnormal weight loss: Secondary | ICD-10-CM

## 2019-01-31 DIAGNOSIS — N280 Ischemia and infarction of kidney: Secondary | ICD-10-CM | POA: Diagnosis not present

## 2019-01-31 DIAGNOSIS — I10 Essential (primary) hypertension: Secondary | ICD-10-CM

## 2019-01-31 LAB — COMPREHENSIVE METABOLIC PANEL
ALT: 13 U/L (ref 0–35)
AST: 16 U/L (ref 0–37)
Albumin: 3.9 g/dL (ref 3.5–5.2)
Alkaline Phosphatase: 79 U/L (ref 39–117)
BUN: 13 mg/dL (ref 6–23)
CO2: 24 mEq/L (ref 19–32)
Calcium: 9.9 mg/dL (ref 8.4–10.5)
Chloride: 98 mEq/L (ref 96–112)
Creatinine, Ser: 1.09 mg/dL (ref 0.40–1.20)
GFR: 60.95 mL/min (ref >60.00)
Glucose, Bld: 109 mg/dL — ABNORMAL HIGH (ref 70–99)
Potassium: 4 mEq/L (ref 3.5–5.1)
Sodium: 131 mEq/L — ABNORMAL LOW (ref 135–145)
Total Bilirubin: 0.6 mg/dL (ref 0.2–1.2)
Total Protein: 8.7 g/dL — ABNORMAL HIGH (ref 6.0–8.3)

## 2019-01-31 LAB — CBC WITH DIFFERENTIAL/PLATELET
Basophils Absolute: 0.1 10*3/uL (ref 0.0–0.1)
Basophils Relative: 0.9 % (ref 0.0–3.0)
Eosinophils Absolute: 0 10*3/uL (ref 0.0–0.7)
Eosinophils Relative: 0.4 % (ref 0.0–5.0)
HCT: 42.3 % (ref 36.0–46.0)
Hemoglobin: 13.9 g/dL (ref 12.0–15.0)
Lymphocytes Relative: 17.5 % (ref 12.0–46.0)
Lymphs Abs: 1.8 10*3/uL (ref 0.7–4.0)
MCHC: 32.9 g/dL (ref 30.0–36.0)
MCV: 76.7 fl — ABNORMAL LOW (ref 78.0–100.0)
Monocytes Absolute: 0.9 10*3/uL (ref 0.1–1.0)
Monocytes Relative: 8.5 % (ref 3.0–12.0)
Neutro Abs: 7.3 10*3/uL (ref 1.4–7.7)
Neutrophils Relative %: 72.7 % (ref 43.0–77.0)
Platelets: 341 10*3/uL (ref 150.0–400.0)
RBC: 5.52 Mil/uL — ABNORMAL HIGH (ref 3.87–5.11)
RDW: 17.9 % — ABNORMAL HIGH (ref 11.5–15.5)
WBC: 10.1 10*3/uL (ref 4.0–10.5)

## 2019-01-31 LAB — T4, FREE: Free T4: 1.22 ng/dL (ref 0.60–1.60)

## 2019-01-31 LAB — TSH: TSH: 0.75 u[IU]/mL (ref 0.35–4.50)

## 2019-01-31 NOTE — Patient Instructions (Addendum)
Blood work and x-ray today.     Continue your current medications   A referral was ordered for GI.     Follow up in two weeks.

## 2019-02-01 ENCOUNTER — Encounter: Payer: Self-pay | Admitting: Internal Medicine

## 2019-02-01 DIAGNOSIS — R1013 Epigastric pain: Secondary | ICD-10-CM | POA: Insufficient documentation

## 2019-02-01 DIAGNOSIS — N280 Ischemia and infarction of kidney: Secondary | ICD-10-CM | POA: Insufficient documentation

## 2019-02-01 DIAGNOSIS — R634 Abnormal weight loss: Secondary | ICD-10-CM | POA: Insufficient documentation

## 2019-02-01 DIAGNOSIS — R0600 Dyspnea, unspecified: Secondary | ICD-10-CM | POA: Insufficient documentation

## 2019-02-01 NOTE — Assessment & Plan Note (Signed)
Slightly elevated here today, but she is in pain F/u in 2 weeks Continue current medication

## 2019-02-01 NOTE — Assessment & Plan Note (Signed)
Chronic H/o smoking Has been experiencing weight loss Will check cxr

## 2019-02-01 NOTE — Assessment & Plan Note (Signed)
Per Ct scan it is subacute or chronic She was off her elquis for one month while on vacation that started 3/10 She has atherosclerosis of her aorta and afib - ether could be the source -- she has an appt with vascular this month.   Stressed compliance with eliquis Recheck cmp today

## 2019-02-01 NOTE — Assessment & Plan Note (Signed)
She does not weigh herself at home - in the past three months she has lost 11 lbs, but she feels this has happened in the past one month Check tsh, ft4 CXR given h/o smoking and dyspnea Will see GI since she is having epigastric pain and is eating less F/u in 2 weeks

## 2019-02-01 NOTE — Assessment & Plan Note (Signed)
Started when her right sided pain started Early satiety and eating less - has had significant weight loss in the past month Will refer to GI

## 2019-02-10 ENCOUNTER — Other Ambulatory Visit: Payer: Self-pay

## 2019-02-10 DIAGNOSIS — I779 Disorder of arteries and arterioles, unspecified: Secondary | ICD-10-CM

## 2019-02-10 DIAGNOSIS — Z95828 Presence of other vascular implants and grafts: Secondary | ICD-10-CM

## 2019-02-12 ENCOUNTER — Ambulatory Visit (HOSPITAL_COMMUNITY)
Admission: RE | Admit: 2019-02-12 | Discharge: 2019-02-12 | Disposition: A | Discharge status: Home / Self Care | Payer: Medicare Other | Source: Ambulatory Visit | Attending: Family | Admitting: Family

## 2019-02-12 ENCOUNTER — Ambulatory Visit (INDEPENDENT_AMBULATORY_CARE_PROVIDER_SITE_OTHER)
Admission: RE | Admit: 2019-02-12 | Discharge: 2019-02-12 | Disposition: A | Discharge status: Home / Self Care | Payer: Medicare Other | Source: Ambulatory Visit | Attending: Family | Admitting: Family

## 2019-02-12 ENCOUNTER — Other Ambulatory Visit: Payer: Self-pay

## 2019-02-12 ENCOUNTER — Ambulatory Visit (INDEPENDENT_AMBULATORY_CARE_PROVIDER_SITE_OTHER): Payer: Medicare Other | Admitting: Family

## 2019-02-12 ENCOUNTER — Encounter: Payer: Self-pay | Admitting: Family

## 2019-02-12 VITALS — BP 118/80 | HR 61 | Temp 98.1°F | Resp 12 | Ht 65.0 in | Wt 109.8 lb

## 2019-02-12 DIAGNOSIS — Z87891 Personal history of nicotine dependence: Secondary | ICD-10-CM

## 2019-02-12 DIAGNOSIS — I779 Disorder of arteries and arterioles, unspecified: Secondary | ICD-10-CM

## 2019-02-12 DIAGNOSIS — R634 Abnormal weight loss: Secondary | ICD-10-CM

## 2019-02-12 DIAGNOSIS — Z7722 Contact with and (suspected) exposure to environmental tobacco smoke (acute) (chronic): Secondary | ICD-10-CM | POA: Diagnosis not present

## 2019-02-12 DIAGNOSIS — Z95828 Presence of other vascular implants and grafts: Secondary | ICD-10-CM | POA: Insufficient documentation

## 2019-02-12 DIAGNOSIS — R1084 Generalized abdominal pain: Secondary | ICD-10-CM

## 2019-02-12 NOTE — Progress Notes (Signed)
[facility] [place]   CC: Follow up peripheral artery occlusive disease  History of Present Illness Mercedes [name] is a 63 y.o. female who is s/p left femoral to below-knee popliteal bypass and amputation of her left first toe on December 02, 2013 by Dr. Oneida Alar. She had one vessel peroneal runoff. This was a non-reversed vein graft.    The rest pain in her left foot has resolved. She no longer has claudication symptoms with walking.  Pt denies any known history of stroke or TIA, does report 2 MI's.  Pacemaker inserted March 2016 for tachy/brady, states this doing well.  She had a CABG in November 2017, then rehab in a NH.  She attended cardiac rehab.   Diabetic: No Tobacco use: former smoker, quit in 2015, however, she is exposed to secondhand smoke in her home   Pt meds include: Statin :No, pt states that she recently saw her PCP and her cholesterol is good Betablocker: Yes ASA: No, she states that her cardiologist advised her to stop  Other anticoagulants/antiplatelets: Eliquis, has a hx of PAF   Past Medical History:  Diagnosis Date  . Abnormal LFTs    a. in the past when on Lipitor  . Anemia    as a young woman  . Anxiety   . Arnold-Chiari malformation, type I ([place])    MRI brain 02/2010  . CAD (coronary artery disease)    a. NSTEMI 07/2009: LHC - D2 40%, LAD irreg., EF 50% with apical AK (tako-tsubo CM);  b. Inf STEMI (04/2013): Tx Promus DES to CFX;  c. 08/2013 Lexi CL: No ischemia, dist ant/ap/inf-ap infarct, EF  d. 06/2016 NSTEMI with LM disease: s/p CABG on 07/17/2016 w/ LIMA-LAD, SVG-OM, and SVG-dRCA.  [name] DEPRESSION   . Dizziness    ? CVA 01/2010 - carotid dopplers with no ICA stenosis  . GERD (gastroesophageal reflux disease)   . HYPERLIPIDEMIA   . HYPERTENSION   . MI (myocardial infarction) ([place])   . PAD (peripheral artery disease) ([place])    s/p L fem pop 11/2013 in setting of 1st toe osteo/gangrene  . Paroxysmal atrial fibrillation  (HCC)    a. anticoagulated with Eliquis 10/2014  . RA (rheumatoid arthritis) ([place])    prior tx by Dr. Ouida Sills  . Sjogren's syndrome ([place]) 06/02/13   pt denies this (12/01/13)  . SLE (systemic lupus erythematosus) ([place]) dx 05/2013   follows with rheum Ouida Sills)  . Tachy-brady syndrome (HCC)    a. s/p STJ Leadless pacemaker 11-04-2014 Dr Rayann Heman  . 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 Smoking status: Former Smoker    Types: Cigarettes    Quit date: 05/19/2016    Years since quitting: 2.7  . Smokeless tobacco: Never Used  . Tobacco comment:    Substance Use Topics  . Alcohol use: No    Alcohol/week: 0.0 standard drinks    Frequency: Never  . Drug use: No    Family History Family History  Problem Relation Age of Onset  . Arthritis Mother   . Emphysema Mother   . Arthritis Father   . COPD Father   . Heart disease Father   . Diabetes Paternal Aunt        paternal Aunts  . Heart disease Paternal Aunt   . Thyroid disease Neg Hx     Past Surgical  History:  Procedure Laterality Date  . ABDOMINAL AORTAGRAM N/A 11/24/2013   Procedure: ABDOMINAL AORTAGRAM WITH BILATERAL RUNOFF WITH POSSIBLE INTERVENTION;  Surgeon: Chuck Hinthristopher S Dickson, MD;  [facility];  Service: Cardiovascular;  Laterality: N/A;  . AMPUTATION Left 12/02/2013   Procedure: LEFT 1ST TOE AMPUTATION;  Surgeon: Sherren Kernsharles E Fields, MD;  [facility];  Service: Vascular;  Laterality: Left;  . CARDIAC CATHETERIZATION N/A 07/12/2016   Procedure: Left Heart Cath and Coronary Angiography;  Surgeon: Lennette Biharihomas A Kelly, MD;  [facility];  Service: Cardiovascular;  Laterality: N/A;  . CARDIAC CATHETERIZATION N/A 07/14/2016   Procedure: Intravascular Ultrasound/IVUS;  Surgeon: Yvonne Kendallhristopher End, MD;  Location: [facility] INVASIVE CV LAB;  Service: Cardiovascular;  Laterality: N/A;  . COLONOSCOPY     . CORONARY ANGIOPLASTY  04/2013  . CORONARY ARTERY BYPASS GRAFT N/A 07/17/2016   Procedure: CORONARY ARTERY BYPASS GRAFTING (CABG)x3 using right greater saphenous vein harvested endoscopiclly and left internal mammary artery;  Surgeon: Kerin PernaPeter Van Trigt, MD;  [facility];  Service: Open Heart Surgery;  Laterality: N/A;  . FEMORAL-POPLITEAL BYPASS GRAFT Left 12/02/2013   Procedure:  LEFT FEMORAL-BELOW KNEE POPLITEAL ARTERY BYPASS GRAFT;  Surgeon: Sherren Kernsharles E Fields, MD;  [facility];  Service: Vascular;  Laterality: Left;  . LEFT HEART CATHETERIZATION WITH CORONARY ANGIOGRAM N/A 05/19/2013   Procedure: LEFT HEART CATHETERIZATION WITH CORONARY ANGIOGRAM;  Surgeon: Micheline ChapmanMichael D Cooper, MD;  [facility];  Service: Cardiovascular;  Laterality: N/A;  . LEFT HEART CATHETERIZATION WITH CORONARY ANGIOGRAM N/A 10/28/2014   Procedure: LEFT HEART CATHETERIZATION WITH CORONARY ANGIOGRAM;  Surgeon: Iran OuchMuhammad A Arida, MD;  [facility]: [facility];  Service: Cardiovascular;  Laterality: N/A;  . PERCUTANEOUS CORONARY STENT INTERVENTION (PCI-S)  05/19/2013   Procedure: PERCUTANEOUS CORONARY STENT INTERVENTION (PCI-S);  Surgeon: Micheline ChapmanMichael D Cooper, MD;  [facility];  Service: Cardiovascular;;  . PERMANENT PACEMAKER INSERTION N/A 11/04/2014   STJ Leadless pacemaker implanted by Dr Johney FrameAllred for tachy/brady  . TEE WITHOUT CARDIOVERSION N/A 07/17/2016   Procedure: TRANSESOPHAGEAL ECHOCARDIOGRAM (TEE);  Surgeon: Kerin PernaPeter Van Trigt, MD;  [facility];  Service: Open Heart Surgery;  Laterality: N/A;  . TUBAL LIGATION      Allergies  Allergen Reactions  . Brilinta [Ticagrelor] Anaphylaxis and Other (See Comments)    Also, chest tightness  . Latex Rash    Current Outpatient Medications  Medication Sig Dispense Refill  . ELIQUIS 5 MG TABS tablet TAKE 1 TABLET BY MOUTH TWICE DAILY (Patient taking differently: Take 5 mg by mouth 2 (two) times daily. ) 180 tablet 3  . lisinopril (PRINIVIL,ZESTRIL) 5 MG tablet  TAKE 1 TABLET(5 MG) BY MOUTH DAILY. FOLLOW UP APPOINTMENT FOR FURTHER REFILLS (Patient taking differently: Take 5 mg by mouth daily as needed (if Systolic reading is 180 or greater). ) 90 tablet 3  . metoprolol tartrate (LOPRESSOR) 25 MG tablet Take 0.5 tablets (12.5 mg total) by mouth 2 (two) times daily. 180 tablet 3  . nitroGLYCERIN (NITROSTAT) 0.4 MG SL tablet Place 1 tablet (0.4 mg total) under the tongue every 5 (five) minutes as needed for chest pain. 25 tablet 3   No current facility-administered medications for this visit.     ROS: See HPI for pertinent positives and negatives.   Physical Examination  Vitals:   02/12/19 1103 02/12/19 1105  BP: 121/85 118/80  Pulse: 61   Resp: 12   Temp: 98.1 F (36.7 C)   TempSrc: Oral   SpO2: 98%  Weight: 109 lb 12.8 oz (49.8 kg)   Height: 5\' 5"  (1.651 m)    Body mass index is 18.27 kg/m.  General: A&O x 3, WD, thin female. Gait: normal HENT: No gross abnormalities.  Eyes: Pupils are equal Pulmonary: Respirations are non labored, CTAB, good air movement in all fields Cardiac: regular rhythm, no detected murmur.         Carotid Bruits Right Left   Negative Negative   Radial pulses are 2+ palpable bilaterally   Adominal aortic pulse is moderately palpable, pt is underweight and thin                         VASCULAR EXAM: Extremities without ischemic changes, without Gangrene; without open wounds. Left great toe is surgically absent.                                                                                                           LE Pulses Right Left       FEMORAL  2+ palpable  2+ palpable        POPLITEAL  not palpable   not palpable       POSTERIOR TIBIAL  not palpable   not palpable        DORSALIS PEDIS      ANTERIOR TIBIAL not palpable  not palpable    Abdomen: soft, moderately tender to palpation at RUQ and RLQ, no palpable masses. Skin: no rashes, no cellulitis, no ulcers noted. All toes bialterally are  cool to touch, no discoloration.   Musculoskeletal: no muscle wasting or atrophy. See Extremities.  Neurologic: A&O X 3; appropriate affect, Sensation is normal; MOTOR FUNCTION:  moving all extremities equally, motor strength 5/5 throughout. Speech is fluent/normal. CN 2-12 intact. Psychiatric: Thought content is normal, mood appropriate for clinical situation.    ASSESSMENT: Mercedes [name] is a 63 y.o. female who is s/p left femoral to below-knee popliteal bypass and amputation of her left first toe on December 02 2013. She had one vessel peroneal runoff. This was a non-reversed vein graft.   The rest pain in her left foot has resolved.  She no longer has claudication sx's in her legs with walking; the tips of her right toes are slightly cool.  She admits to not walking enough.   Left leg bypass graft with 371 and 373 PSV at the inflow and proximal anastomosis, all biphasic waveforms.  Bilateral ABIs appear essentially unchanged compared to prior study on 11/06/2017. Right TBIs appear increased compared to prior study on 11/06/2017. Monophasic waveforms in the right, bi and monophasic in the left.   Pt's atherosclerotic risk factors include former smoker, exposure to secondhand smoke in her home, and CAD. She no longer takes a statin and ASA, she continues on Eliquis.  Postprandial abdominal pain, RLQ and RUQ abdominal tenderness to palpation, weight loss, will check mesenteric duplex for possible mesenteric ischemia. See Plan.  She is scheduled to see a gastroenterologist soon, Dr. Karolee Ohs.     DATA  Left Graft #1: CFA-BK popliteal +--------------------+--------+---------------+--------+-----------------+                     PSV cm/sStenosis       WaveformComments          +--------------------+--------+---------------+--------+-----------------+ Inflow              371     50-74% stenosisbiphasichomogenous  plaque +--------------------+--------+---------------+--------+-----------------+ Proximal Anastomosis373                    biphasic                  +--------------------+--------+---------------+--------+-----------------+ Proximal Graft      31                     biphasic                  +--------------------+--------+---------------+--------+-----------------+ Mid Graft           40                     biphasic                  +--------------------+--------+---------------+--------+-----------------+ Distal Graft        31                     biphasic                  +--------------------+--------+---------------+--------+-----------------+ Distal Anastamosis  49                     biphasic                  +--------------------+--------+---------------+--------+-----------------+ Outflow             56                     biphasic                  +--------------------+--------+---------------+--------+-----------------+  Summary: Left Graft(s): CFA-BK popliteal bypass graft visualized with a 50-74% stenosis noted. in the inflow artery with plaque visualized.   ABI (Date: 02/12/2019): ABI Findings: +---------+------------------+-----+----------+--------+ Right    Rt Pressure (mmHg)IndexWaveform  Comment  +---------+------------------+-----+----------+--------+ Brachial 131                                       +---------+------------------+-----+----------+--------+ PTA      70                0.53 monophasic         +---------+------------------+-----+----------+--------+ DP       61                0.47 monophasic         +---------+------------------+-----+----------+--------+ Great Toe56                0.43                    +---------+------------------+-----+----------+--------+  +---------+------------------+-----+----------+----------+ Left     Lt Pressure (mmHg)IndexWaveform  Comment     +---------+------------------+-----+----------+----------+ Brachial 129                                         +---------+------------------+-----+----------+----------+ PTA  105               0.80 monophasic           +---------+------------------+-----+----------+----------+ DP       113               0.86 biphasic             +---------+------------------+-----+----------+----------+ Great Toe                                 amputation +---------+------------------+-----+----------+----------+  +-------+-----------+-----------+------------+------------+ ABI/TBIToday's ABIToday's TBIPrevious ABIPrevious TBI +-------+-----------+-----------+------------+------------+ Right  0.53       0.43       0.53        0.38         +-------+-----------+-----------+------------+------------+ Left   0.86       NA         0.90        NA           +-------+-----------+-----------+------------+------------+  Bilateral ABIs appear essentially unchanged compared to prior study on 11/06/2017. Right TBIs appear increased compared to prior study on 11/06/2017.   Summary: Right: Resting right ankle-brachial index indicates moderate right lower extremity arterial disease. The right toe-brachial index is abnormal. RT great toe pressure = 56 mmHg.  Left: Resting left ankle-brachial index indicates mild left lower extremity arterial disease.  CT Abd/pelvis, heading states with contrast, vascular findings state non angiographic (01-28-19), for abdominal pain: Vascular/Lymphatic: Severe mixed calcific atherosclerosis. The abdominal branch vessel origins are grossly patent on this non angiographic examination. No enlarged abdominal or pelvic lymph nodes.    PLAN:  Based on the patient's vascular studies, HPI,  and examination, will schedule mesenteric duplex for ASAP, and see Dr. Darrick PennaFields afterward. At that time he can discuss with her the best approach to  address the moderate stenosis of the ateries in her left leg, no significant change in left ABI's.   She does have some postprandial abdominal pain, but also has abdominal pain at other times and general loss of appetite. She also has weight loss.  She is scheduled to see Dr. Karolee OhsMalcom Stark, gastroenterologist, on 02-25-19.   I discussed in depth with the patient the nature of atherosclerosis, and emphasized the importance of maximal medical management including strict control of blood pressure, blood glucose, and lipid levels, obtaining regular exercise, and continued cessation of smoking.  The patient is aware that without maximal medical management the underlying atherosclerotic disease process will progress, limiting the benefit of any interventions.  The patient was given information about PAD including signs, symptoms, treatment, what symptoms should prompt the patient to seek immediate medical care, and risk reduction measures to take.  Charisse MarchSuzanne Tannisha Kennington, RN, MSN, FNP-C [facility] [place] Office Phone: [phone]  Clinic MD: Veda CanningFields, Dickson  02/12/19 11:46 AM

## 2019-02-12 NOTE — Patient Instructions (Signed)
Peripheral Vascular Disease  Peripheral vascular disease (PVD) is a disease of the blood vessels that are not part of your heart and brain. A simple term for PVD is poor circulation. In most cases, PVD narrows the blood vessels that carry blood from your heart to the rest of your body. This can reduce the supply of blood to your arms, legs, and internal organs, like your stomach or kidneys. However, PVD most often affects a person's lower legs and feet. Without treatment, PVD tends to get worse. PVD can also lead to acute ischemic limb. This is when an arm or leg suddenly cannot get enough blood. This is a medical emergency. Follow these instructions at home: Lifestyle  Do not use any products that contain nicotine or tobacco, such as cigarettes and e-cigarettes. If you need help quitting, ask your doctor.  Lose weight if you are overweight. Or, stay at a healthy weight as told by your doctor.  Eat a diet that is low in fat and cholesterol. If you need help, ask your doctor.  Exercise regularly. Ask your doctor for activities that are right for you. General instructions  Take over-the-counter and prescription medicines only as told by your doctor.  Take good care of your feet: ? Wear comfortable shoes that fit well. ? Check your feet often for any cuts or sores.  Keep all follow-up visits as told by your doctor This is important. Contact a doctor if:  You have cramps in your legs when you walk.  You have leg pain when you are at rest.  You have coldness in a leg or foot.  Your skin changes.  You are unable to get or have an erection (erectile dysfunction).  You have cuts or sores on your feet that do not heal. Get help right away if:  Your arm or leg turns cold, numb, and blue.  Your arms or legs become red, warm, swollen, painful, or numb.  You have chest pain.  You have trouble breathing.  You suddenly have weakness in your face, arm, or leg.  You become very  confused or you cannot speak.  You suddenly have a very bad headache.  You suddenly cannot see. Summary  Peripheral vascular disease (PVD) is a disease of the blood vessels.  A simple term for PVD is poor circulation. Without treatment, PVD tends to get worse.  Treatment may include exercise, low fat and low cholesterol diet, and quitting smoking. This information is not intended to replace advice given to you by your health care provider. Make sure you discuss any questions you have with your health care provider. Document Released: 11/08/2009 Document Revised: 09/21/2016 Document Reviewed: 09/21/2016 Elsevier Interactive Patient Education  2019 [employer]  

## 2019-02-13 ENCOUNTER — Other Ambulatory Visit: Payer: Self-pay

## 2019-02-13 DIAGNOSIS — I779 Disorder of arteries and arterioles, unspecified: Secondary | ICD-10-CM

## 2019-02-13 NOTE — Progress Notes (Signed)
Subjective:    Patient ID: Mercedes [name], female    DOB: 07-15-1956, 63 y.o.   MRN: [id number]  HPI The patient is here for follow up.   Weight loss, unintentional:   She has been weighing herself at home and she has continued to lose weight.  She is incredibly frustrated.  She wants to know why she is losing weight.  Her blood work, chest x-ray have been normal.  Her CT abdomen pelvis from the emergency room did not reveal any potential cancer.  She is not able to eat much.  She has early satiety and has to force herself to eat.  She is experiencing significant constipation.  She states all of the symptoms started after her hospitalization in March.  When she saw me in March 10 she weighed 120.  She now weighs 107 on the same scale.   Vascular ordered ultrasound of the mesenteric artery.  She sees GI on 6/30.  She does not know if she is ever had a colonoscopy.     Renal infarct:  Possibly related to being off eliquis for one month march-april.  She no longer has right sided pain.  She did see vascular.    Medications and allergies reviewed with patient and updated if appropriate.  Patient Active Problem List   Diagnosis Date Noted  . Dyspnea 02/01/2019  . Weight loss, unintentional 02/01/2019  . Epigastric pain 02/01/2019  . 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 Hyperbilirubinemia 10/21/2018  . Acute pyelonephritis 10/20/2018  . Acute renal failure superimposed on stage 2 chronic kidney disease ([place]) 10/20/2018  . Sepsis ([place]) 10/20/2018  . Diabetes ([place]) 04/01/2017  . Hyperthyroidism 02/21/2017  . Low mean corpuscular volume (MCV) 09/30/2016  . Coronary artery disease involving native heart without angina pectoris   . Hx of CABG 07/17/2016  . Paroxysmal atrial fibrillation ([place]) 07/12/2016  . NSTEMI (non-ST elevated myocardial infarction) ([place]) 07/11/2016  . History of arterial bypass of lower extremity 12/30/2015  . Claudication of right lower extremity ([place]) 12/30/2015   . Phantom limb pain-Left great toe 04/29/2015  . Seizure ([place])   . Atrial flutter with rapid ventricular response vs. SVT 10/27/2014    Class: Acute  . Atherosclerosis of native arteries of the extremities with gangrene ([place]) 12/18/2013  . PAD (peripheral artery disease) ([place]) 12/02/2013  . SLE (systemic lupus erythematosus) ([place])   . CAD - S/P Takotsubo MI in 2000, CFX DES Sept 2014 05/25/2013  . Acute MI, inferior wall ([place]) 05/19/2013  . 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 Tachycardia-bradycardia ([place]) 03/09/2010  . PVD- s/p Lt [facility] April 2015 10/29/2009  . Dyslipidemia 08/26/2009  . Essential hypertension 08/26/2009  . CORONARY ATHEROSCLEROSIS NATIVE CORONARY ARTERY 08/26/2009  . Secondary cardiomyopathy ([place]) 08/26/2009  . Rheumatoid arthritis ([place]) 08/26/2009    Current Outpatient Medications on File Prior to Visit  Medication Sig Dispense Refill  . ELIQUIS 5 MG TABS tablet TAKE 1 TABLET BY MOUTH TWICE DAILY (Patient taking differently: Take 5 mg by mouth 2 (two) times daily. ) 180 tablet 3  . lisinopril (PRINIVIL,ZESTRIL) 5 MG tablet TAKE 1 TABLET(5 MG) BY MOUTH DAILY. FOLLOW UP APPOINTMENT FOR FURTHER REFILLS (Patient taking differently: Take 5 mg by mouth daily as needed (if Systolic reading is 333 or greater). ) 90 tablet 3  . metoprolol tartrate (LOPRESSOR) 25 MG tablet Take 0.5 tablets (12.5 mg total) by mouth 2 (two) times daily. 180 tablet 3  . nitroGLYCERIN (NITROSTAT) 0.4 MG SL tablet Place 1  tablet (0.4 mg total) under the tongue every 5 (five) minutes as needed for chest pain. 25 tablet 3   No current facility-administered medications on file prior to visit.     Past Medical History:  Diagnosis Date  . Abnormal LFTs    a. in the past when on Lipitor  . Anemia    as a young woman  . Anxiety   . Arnold-Chiari malformation, type I (HCC)    MRI brain 02/2010  . CAD (coronary artery disease)    a. NSTEMI 07/2009: LHC - D2 40%, LAD irreg., EF 50% with apical AK  (tako-tsubo CM);  b. Inf STEMI (04/2013): Tx Promus DES to CFX;  c. 08/2013 Lexi CL: No ischemia, dist ant/ap/inf-ap infarct, EF  d. 06/2016 NSTEMI with LM disease: s/p CABG on 07/17/2016 w/ LIMA-LAD, SVG-OM, and SVG-dRCA.  [name]. DEPRESSION   . Dizziness    ? CVA 01/2010 - carotid dopplers with no ICA stenosis  . GERD (gastroesophageal reflux disease)   . HYPERLIPIDEMIA   . HYPERTENSION   . MI (myocardial infarction) (HCC)   . PAD (peripheral artery disease) (HCC)    s/p L fem pop 11/2013 in setting of 1st toe osteo/gangrene  . Paroxysmal atrial fibrillation (HCC)    a. anticoagulated with Eliquis 10/2014  . RA (rheumatoid arthritis) (HCC)    prior tx by Dr. Dareen PianoAnderson  . Sjogren's syndrome (HCC) 06/02/13   pt denies this (12/01/13)  . SLE (systemic lupus erythematosus) (HCC) dx 05/2013   follows with rheum Dareen Piano([place])  . Tachy-brady syndrome (HCC)    a. s/p STJ Leadless pacemaker 11-04-2014 Dr Johney FrameAllred  . 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 ABDOMINAL AORTAGRAM N/A 11/24/2013   Procedure: ABDOMINAL AORTAGRAM WITH BILATERAL RUNOFF WITH POSSIBLE INTERVENTION;  Surgeon: Chuck Hinthristopher S Dickson, MD;  [facility];  Service: Cardiovascular;  Laterality: N/A;  . AMPUTATION Left 12/02/2013   Procedure: LEFT 1ST TOE AMPUTATION;  Surgeon: Sherren Kernsharles E Fields, MD;  [facility];  Service: Vascular;  Laterality: Left;  . CARDIAC CATHETERIZATION N/A 07/12/2016   Procedure: Left Heart Cath and Coronary Angiography;  Surgeon: Lennette Biharihomas A Kelly, MD;  [facility];  Service: Cardiovascular;  Laterality: N/A;  . CARDIAC CATHETERIZATION N/A 07/14/2016   Procedure: Intravascular Ultrasound/IVUS;  Surgeon: Yvonne Kendallhristopher End, MD;  Location: [facility] INVASIVE CV LAB;  Service: Cardiovascular;  Laterality: N/A;  . COLONOSCOPY    . CORONARY ANGIOPLASTY  04/2013  . CORONARY ARTERY BYPASS  GRAFT N/A 07/17/2016   Procedure: CORONARY ARTERY BYPASS GRAFTING (CABG)x3 using right greater saphenous vein harvested endoscopiclly and left internal mammary artery;  Surgeon: Kerin PernaPeter Van Trigt, MD;  [facility];  Service: Open Heart Surgery;  Laterality: N/A;  . FEMORAL-POPLITEAL BYPASS GRAFT Left 12/02/2013   Procedure:  LEFT FEMORAL-BELOW KNEE POPLITEAL ARTERY BYPASS GRAFT;  Surgeon: Sherren Kernsharles E Fields, MD;  [facility];  Service: Vascular;  Laterality: Left;  . LEFT HEART CATHETERIZATION WITH CORONARY ANGIOGRAM N/A 05/19/2013   Procedure: LEFT HEART CATHETERIZATION WITH CORONARY ANGIOGRAM;  Surgeon: Micheline ChapmanMichael D Cooper, MD;  [facility];  Service: Cardiovascular;  Laterality: N/A;  . LEFT HEART CATHETERIZATION WITH CORONARY ANGIOGRAM N/A 10/28/2014   Procedure: LEFT HEART CATHETERIZATION WITH CORONARY ANGIOGRAM;  Surgeon: Iran OuchMuhammad A Arida, MD;  Location: [facility] CATH LAB;  Service: Cardiovascular;  Laterality: N/A;  . PERCUTANEOUS CORONARY STENT  INTERVENTION (PCI-S)  05/19/2013   Procedure: PERCUTANEOUS CORONARY STENT INTERVENTION (PCI-S);  Surgeon: Micheline Chapman, MD;  Location[facility] CATH LAB;  Service: Cardiovascular;;  . PERMANENT PACEMAKER INSERTION N/A 11/04/2014   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  . TEE WITHOUT CARDIOVERSION N/A 07/17/2016   Procedure: TRANSESOPHAGEAL ECHOCARDIOGRAM (TEE);  Surgeon: Kerin Perna, MD;  Location: [facility];  Service: Open Heart Surgery;  Laterality: N/A;  . TUBAL LIGATION      Social History   Socioeconomic History  . Marital status: Single    Spouse name: Not on file  . Number of children: 3  . Years of education: Not on file  . Highest education level: Not on file  Occupational History  . Not on file  Social Needs  . Financial resource strain: Not on file  . Food insecurity    Worry: Not on file    Inability: Not on file  . Transportation needs    Medical: Not on file    Non-medical: Not on file  Tobacco Use   . Smoking status: Former Smoker    Types: Cigarettes    Quit date: 05/19/2016    Years since quitting: 2.7  . Smokeless tobacco: Never Used  . Tobacco comment:    Substance and Sexual Activity  . Alcohol use: No    Alcohol/week: 0.0 standard drinks    Frequency: Never  . Drug use: No  . Sexual activity: Not on file  Lifestyle  . Physical activity    Days per week: Not on file    Minutes per session: Not on file  . Stress: Not on file  Relationships  . Social Musician on phone: Not on file    Gets together: Not on file    Attends religious service: Not on file    Active member of club or organization: Not on file    Attends meetings of clubs or organizations: Not on file    Relationship status: Not on file  Other Topics Concern  . Not on file  Social History Narrative   Lives alone.    Family History  Problem Relation Age of Onset  . Arthritis Mother   . Emphysema Mother   . Arthritis Father   . COPD Father   . Heart disease Father   . Diabetes Paternal Aunt        paternal Aunts  . Heart disease Paternal Aunt   . Thyroid disease Neg Hx     Review of Systems  Constitutional: Negative for chills and fever.  HENT: Negative for sore throat and trouble swallowing.   Respiratory: Positive for shortness of breath (chronic). Negative for cough and wheezing.   Cardiovascular: Negative for chest pain and palpitations.  Gastrointestinal: Positive for abdominal pain (epigastric pain) and constipation. Negative for blood in stool, nausea and vomiting.  Genitourinary: Negative for dysuria and hematuria.  Neurological: Negative for headaches.       Objective:   Vitals:   02/14/19 1550  BP: (!) 154/100  Pulse: 87  Temp: 98.8 F (37.1 C)  SpO2: 98%   BP Readings from Last 3 Encounters:  02/14/19 (!) 154/100  02/12/19 118/80  01/31/19 (!) 142/88   Wt Readings from Last 3 Encounters:  02/14/19 107 lb 4 oz (48.6 kg)  02/12/19 109 lb 12.8 oz (49.8 kg)   01/31/19 109 lb (49.4 kg)  11/05/18 --     120 Body mass index is 17.85 kg/m.  Physical Exam    Constitutional: Thin female in no acute distress.  HENT:  Head: Normocephalic and atraumatic.  Neck: Neck supple. No tracheal deviation present. No thyromegaly present.  No cervical lymphadenopathy Cardiovascular: Normal rate, regular rhythm and normal heart sounds.  No murmur heard. No edema Pulmonary/Chest: Effort normal and breath sounds normal. No respiratory distress. No has no wheezes. No rales. Abdomen: She deferred-she states she has no pain now and when I push on her stomach the last time I saw her she had increased pain afterwards and she does not want that now Skin: Skin is warm and dry. Not diaphoretic.  Psychiatric: Frustrated, anxious mood and affect.       Assessment & Plan:    See Problem List for Assessment and Plan of chronic medical problems.

## 2019-02-14 ENCOUNTER — Encounter: Payer: Self-pay | Admitting: Internal Medicine

## 2019-02-14 ENCOUNTER — Ambulatory Visit (INDEPENDENT_AMBULATORY_CARE_PROVIDER_SITE_OTHER): Payer: Medicare Other | Admitting: Internal Medicine

## 2019-02-14 ENCOUNTER — Other Ambulatory Visit: Payer: Self-pay

## 2019-02-14 VITALS — BP 154/100 | HR 87 | Temp 98.8°F | Ht 65.0 in | Wt 107.2 lb

## 2019-02-14 DIAGNOSIS — R1013 Epigastric pain: Secondary | ICD-10-CM

## 2019-02-14 DIAGNOSIS — N280 Ischemia and infarction of kidney: Secondary | ICD-10-CM | POA: Diagnosis not present

## 2019-02-14 DIAGNOSIS — R634 Abnormal weight loss: Secondary | ICD-10-CM | POA: Diagnosis not present

## 2019-02-14 MED ORDER — DULCOLAX 5 MG PO TBEC: 5.0000 mg | DELAYED_RELEASE_TABLET | Freq: Every day | ORAL | 0 refills | Status: DC | PRN

## 2019-02-14 NOTE — Assessment & Plan Note (Signed)
3/10 she weighed 120 here, now weighs 107 Having epigastric pain, early satiety with decreased intake and constipation Saw vascular and has a vascular ultrasound of the mesenteric arteries Sees GI 6/30 She is unsure if she is ever had a colonoscopy Blood work at her last visit unremarkable CT the abdomen pelvis done in the ED earlier this month Will await GI work-up, she will continue to try to eat as much as possible to maintain her weight

## 2019-02-14 NOTE — Assessment & Plan Note (Signed)
Was off Eliquis for approximately 1 month and that may have been the cause of her renal infarct, but she is also losing weight and there is always some other concern of cancer She is taking the Eliquis religiously She is no longer experiencing pain in that area

## 2019-02-14 NOTE — Assessment & Plan Note (Signed)
Still experiencing epigastric pain, early satiety and weight loss Sees GI 6/30 Encouraged her to try to eat as much as she can until she is able to see them

## 2019-02-14 NOTE — Patient Instructions (Addendum)
For your constipation -for prevention - take a stool softener: colace 1-3 a day and take miralax daily.     Use the laxative as needed - dulcolax.  This was sent to your pharmacy.     Have the test on the 25th and see GI on the 30th.

## 2019-02-19 ENCOUNTER — Telehealth (HOSPITAL_COMMUNITY): Payer: Self-pay | Admitting: Rehabilitation

## 2019-02-19 NOTE — Telephone Encounter (Signed)

## 2019-02-20 ENCOUNTER — Telehealth: Payer: Self-pay | Admitting: *Deleted

## 2019-02-20 ENCOUNTER — Other Ambulatory Visit: Payer: Self-pay | Admitting: *Deleted

## 2019-02-20 ENCOUNTER — Ambulatory Visit (INDEPENDENT_AMBULATORY_CARE_PROVIDER_SITE_OTHER): Payer: Medicare Other | Admitting: Vascular Surgery

## 2019-02-20 ENCOUNTER — Encounter: Payer: Self-pay | Admitting: *Deleted

## 2019-02-20 ENCOUNTER — Encounter: Payer: Self-pay | Admitting: Vascular Surgery

## 2019-02-20 ENCOUNTER — Ambulatory Visit (HOSPITAL_COMMUNITY)
Admission: RE | Admit: 2019-02-20 | Discharge: 2019-02-20 | Disposition: A | Discharge status: Home / Self Care | Payer: Medicare Other | Source: Ambulatory Visit | Attending: Vascular Surgery | Admitting: Vascular Surgery

## 2019-02-20 ENCOUNTER — Other Ambulatory Visit: Payer: Self-pay

## 2019-02-20 VITALS — BP 111/78 | HR 60 | Temp 97.6°F | Resp 18 | Ht 65.0 in | Wt 110.9 lb

## 2019-02-20 DIAGNOSIS — I779 Disorder of arteries and arterioles, unspecified: Secondary | ICD-10-CM | POA: Diagnosis not present

## 2019-02-20 DIAGNOSIS — R1010 Upper abdominal pain, unspecified: Secondary | ICD-10-CM

## 2019-02-20 NOTE — Telephone Encounter (Signed)
Request for Surgical Clearance  1. What type of surgery is being performed?    MESENTERIC ANGIOGRAM POSSIBLE INTERVENTION/AORTOGRAM     2. When is this surgery scheduled? 03/07/2019  3. What type of clearance is required (medical clearance vs. Pharmacy clearance to hold med vs. Both)?   PHARMACY CLEARANCE FOR MEDICATION HOLD.      4. Are there any medications that need to be held prior to surgery and how long?  ELIQUIS 48 HOUR HOLD PRE-PROCEDURE    5. Practice name and name of physician performing surgery?   [facility] / DR. CHARLES FIELDS     6.  What is your office phone number? [phone]    7. What is your office fax number? (Be sure to include anyone who it needs to go Attn to) [phone] ATTN. [id number]    8. Anesthesia type (None, local, MAC, general)? SEDATION    REMINDER TO USER: Remember to please route this message to P CV DIV PREOP in a phone note.

## 2019-02-20 NOTE — Progress Notes (Signed)
Patient name: Mercedes [name] MRN: [id number] DOB: 1956/04/22 Sex: female  HPI: Mercedes [name] is a 63 y.o. female, seen by her nurse practitioner and sent for further evaluation for possible mesenteric ischemia.  The patient has lost about 20 pounds in the last month.  She reports early satiety as her primary symptom of decreased p.o. intake.  She does not really complain of abdominal pain after meals.  She states she does get some abdominal pain but it is very random when it occurs.  She states that she began losing weight after having a kidney infection about a month ago.  She complains also that she is tired week and also with significant constipation.  She denies any vomiting.  He has previously had a left femoral to below-knee popliteal bypass time she had one-vessel peroneal runoff.  She also had a left first toe amputation with that operation.  This is all healed well..  Recent graft duplex scan showed some potential proximal narrowing above the proximal anastomosis.  She reports no claudication or rest pain symptoms.  Other medical problems include artery disease, prior stroke, hypertension, hyperlipidemia, paroxysmal atrial fibrillation on Eliquis.   Social History   Socioeconomic History  . Marital status: Single    Spouse name: Not on file  . Number of children: 3  . Years of education: Not on file  . Highest education level: Not on file  Occupational History  . Not on file  Social Needs  . Financial resource strain: Not on file  . Food insecurity    Worry: Not on file    Inability: Not on file  . Transportation needs    Medical: Not on file    Non-medical: Not on file  Tobacco Use  . Smoking status: Former Smoker    Types: Cigarettes    Quit date: 05/19/2016    Years since quitting: 2.7  . Smokeless tobacco: Never Used  . Tobacco comment:    Substance and Sexual Activity  . Alcohol use: No    Alcohol/week: 0.0 standard drinks    Frequency: Never  . Drug use: No   . Sexual activity: Not on file  Lifestyle  . Physical activity    Days per week: Not on file    Minutes per session: Not on file  . Stress: Not on file  Relationships  . Social Musician on phone: Not on file    Gets together: Not on file    Attends religious service: Not on file    Active member of club or organization: Not on file    Attends meetings of clubs or organizations: Not on file    Relationship status: Not on file  . Intimate partner violence    Fear of current or ex partner: Not on file    Emotionally abused: Not on file    Physically abused: Not on file    Forced sexual activity: Not on file  Other Topics Concern  . Not on file  Social History Narrative   Lives alone.     Past Medical History:  Diagnosis Date  . Abnormal LFTs    a. in the past when on Lipitor  . Anemia    as a young woman  . Anxiety   . Arnold-Chiari malformation, type I (HCC)    MRI brain 02/2010  . CAD (coronary artery disease)    a. NSTEMI 07/2009: LHC - D2 40%, LAD irreg., EF 50% with apical AK (tako-tsubo  CM);  b. Inf STEMI (04/2013): Tx Promus DES to CFX;  c. 08/2013 Lexi CL: No ischemia, dist ant/ap/inf-ap infarct, EF  d. 06/2016 NSTEMI with LM disease: s/p CABG on 07/17/2016 w/ LIMA-LAD, SVG-OM, and SVG-dRCA.  [name]. DEPRESSION   . Dizziness    ? CVA 01/2010 - carotid dopplers with no ICA stenosis  . GERD (gastroesophageal reflux disease)   . HYPERLIPIDEMIA   . HYPERTENSION   . MI (myocardial infarction) (HCC)   . PAD (peripheral artery disease) (HCC)    s/p L fem pop 11/2013 in setting of 1st toe osteo/gangrene  . Paroxysmal atrial fibrillation (HCC)    a. anticoagulated with Eliquis 10/2014  . RA (rheumatoid arthritis) (HCC)    prior tx by Dr. Dareen PianoAnderson  . Sjogren's syndrome (HCC) 06/02/13   pt denies this (12/01/13)  . SLE (systemic lupus erythematosus) (HCC) dx 05/2013   follows with rheum Dareen Piano([facility])  . Tachy-brady syndrome (HCC)    a. s/p STJ Leadless pacemaker 11-04-2014  Dr Johney FrameAllred  . 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 ABDOMINAL AORTAGRAM N/A 11/24/2013   Procedure: ABDOMINAL AORTAGRAM WITH BILATERAL RUNOFF WITH POSSIBLE INTERVENTION;  Surgeon: Chuck Hinthristopher S Dickson, MD;  [facility];  Service: Cardiovascular;  Laterality: N/A;  . AMPUTATION Left 12/02/2013   Procedure: LEFT 1ST TOE AMPUTATION;  Surgeon: Sherren Kernsharles E Fields, MD;  [facility];  Service: Vascular;  Laterality: Left;  . CARDIAC CATHETERIZATION N/A 07/12/2016   Procedure: Left Heart Cath and Coronary Angiography;  Surgeon: Lennette Biharihomas A Kelly, MD;  [facility];  Service: Cardiovascular;  Laterality: N/A;  . CARDIAC CATHETERIZATION N/A 07/14/2016   Procedure: Intravascular Ultrasound/IVUS;  Surgeon: Yvonne Kendallhristopher End, MD;  Location: [facility] INVASIVE CV LAB;  Service: Cardiovascular;  Laterality: N/A;  . COLONOSCOPY    . CORONARY ANGIOPLASTY  04/2013  . CORONARY ARTERY BYPASS GRAFT N/A 07/17/2016   Procedure: CORONARY ARTERY BYPASS GRAFTING (CABG)x3 using right greater saphenous vein harvested endoscopiclly and left internal mammary artery;  Surgeon: Kerin PernaPeter Van Trigt, MD;  [facility];  Service: Open Heart Surgery;  Laterality: N/A;  . FEMORAL-POPLITEAL BYPASS GRAFT Left 12/02/2013   Procedure:  LEFT FEMORAL-BELOW KNEE POPLITEAL ARTERY BYPASS GRAFT;  Surgeon: Sherren Kernsharles E Fields, MD;  [facility];  Service: Vascular;  Laterality: Left;  . LEFT HEART CATHETERIZATION WITH CORONARY ANGIOGRAM N/A 05/19/2013   Procedure: LEFT HEART CATHETERIZATION WITH CORONARY ANGIOGRAM;  Surgeon: Micheline ChapmanMichael D Cooper, MD;  [facility];  Service: Cardiovascular;  Laterality: N/A;  . LEFT HEART CATHETERIZATION WITH CORONARY ANGIOGRAM N/A 10/28/2014   Procedure: LEFT HEART CATHETERIZATION WITH CORONARY ANGIOGRAM;  Surgeon: Iran OuchMuhammad A Arida, MD;  [facility]: [facility];  Service:  Cardiovascular;  Laterality: N/A;  . PERCUTANEOUS CORONARY STENT INTERVENTION (PCI-S)  05/19/2013   Procedure: PERCUTANEOUS CORONARY STENT INTERVENTION (PCI-S);  Surgeon: Micheline ChapmanMichael D Cooper, MD;  [facility];  Service: Cardiovascular;;  . PERMANENT PACEMAKER INSERTION N/A 11/04/2014   STJ Leadless pacemaker implanted by Dr Johney FrameAllred for tachy/brady  . TEE WITHOUT CARDIOVERSION N/A 07/17/2016   Procedure: TRANSESOPHAGEAL ECHOCARDIOGRAM (TEE);  Surgeon: Kerin PernaPeter Van Trigt, MD;  [facility];  Service: Open Heart Surgery;  Laterality: N/A;  . TUBAL LIGATION      Family History  Problem Relation Age of Onset  . Arthritis Mother   . Emphysema Mother   . Arthritis Father   .  COPD Father   . Heart disease Father   . Diabetes Paternal Aunt        paternal Aunts  . Heart disease Paternal Aunt   . Thyroid disease Neg Hx     SOCIAL HISTORY: Social History   Socioeconomic History  . Marital status: Single    Spouse name: Not on file  . Number of children: 3  . Years of education: Not on file  . Highest education level: Not on file  Occupational History  . Not on file  Social Needs  . Financial resource strain: Not on file  . Food insecurity    Worry: Not on file    Inability: Not on file  . Transportation needs    Medical: Not on file    Non-medical: Not on file  Tobacco Use  . Smoking status: Former Smoker    Types: Cigarettes    Quit date: 05/19/2016    Years since quitting: 2.7  . Smokeless tobacco: Never Used  . Tobacco comment:    Substance and Sexual Activity  . Alcohol use: No    Alcohol/week: 0.0 standard drinks    Frequency: Never  . Drug use: No  . Sexual activity: Not on file  Lifestyle  . Physical activity    Days per week: Not on file    Minutes per session: Not on file  . Stress: Not on file  Relationships  . Social Musician on phone: Not on file    Gets together: Not on file    Attends religious service: Not on file    Active member of  club or organization: Not on file    Attends meetings of clubs or organizations: Not on file    Relationship status: Not on file  . Intimate partner violence    Fear of current or ex partner: Not on file    Emotionally abused: Not on file    Physically abused: Not on file    Forced sexual activity: Not on file  Other Topics Concern  . Not on file  Social History Narrative   Lives alone.    Allergies  Allergen Reactions  . Brilinta [Ticagrelor] Anaphylaxis and Other (See Comments)    Also, chest tightness  . Latex Rash    Current Outpatient Medications  Medication Sig Dispense Refill  . bisacodyl (DULCOLAX) 5 MG EC tablet Take 1 tablet (5 mg total) by mouth daily as needed for moderate constipation. 30 tablet 0  . ELIQUIS 5 MG TABS tablet TAKE 1 TABLET BY MOUTH TWICE DAILY (Patient taking differently: Take 5 mg by mouth 2 (two) times daily. ) 180 tablet 3  . lisinopril (PRINIVIL,ZESTRIL) 5 MG tablet TAKE 1 TABLET(5 MG) BY MOUTH DAILY. FOLLOW UP APPOINTMENT FOR FURTHER REFILLS (Patient taking differently: Take 5 mg by mouth daily as needed (if Systolic reading is 180 or greater). ) 90 tablet 3  . metoprolol tartrate (LOPRESSOR) 25 MG tablet Take 0.5 tablets (12.5 mg total) by mouth 2 (two) times daily. 180 tablet 3  . nitroGLYCERIN (NITROSTAT) 0.4 MG SL tablet Place 1 tablet (0.4 mg total) under the tongue every 5 (five) minutes as needed for chest pain. 25 tablet 3   No current facility-administered medications for this visit.     ROS:   General:  + weight loss, no fever, no chills  HEENT: No recent headaches, no nasal bleeding, no visual changes, no sore throat  Neurologic: No dizziness, blackouts, seizures. No recent symptoms of  stroke or mini- stroke. No recent episodes of slurred speech, or temporary blindness.  Cardiac: No recent episodes of chest pain/pressure, no shortness of breath at rest.  No shortness of breath with exertion.  + history of atrial fibrillation or  irregular heartbeat  Vascular: No history of rest pain in feet.  No history of claudication.  No history of non-healing ulcer, No history of DVT   Pulmonary: No home oxygen, no productive cough, no hemoptysis,  No asthma or wheezing  Musculoskeletal:  [ ]  Arthritis, [ ]  Low back pain,  [ ]  Joint pain  Hematologic:No history of hypercoagulable state.  No history of easy bleeding.  No history of anemia  Gastrointestinal: No hematochezia or melena,  No gastroesophageal reflux, no trouble swallowing  Urinary: [ ]  chronic Kidney disease, [ ]  on HD - [ ]  MWF or [ ]  TTHS, [ ]  Burning with urination, [ ]  Frequent urination, [ ]  Difficulty urinating;   Skin: No rashes  Psychological: No history of anxiety,  No history of depression   Physical Examination  Vitals:   02/20/19 0831  BP: 111/78  Pulse: 60  Resp: 18  Temp: 97.6 F (36.4 C)  SpO2: 100%  Weight: 110 lb 14.4 oz (50.3 kg)  Height: 5\' 5"  (1.651 m)    Body mass index is 18.45 kg/m.  General:  Alert and oriented, no acute distress HEENT: Normal Neck: No bruit or JVD Pulmonary: Clear to auscultation bilaterally Cardiac: Regular Rate and Rhythm  Abdomen: Soft, non-tender, non-distended, no mass, lower midline scar Skin: No rash Extremity Pulses:  2+ radial, brachial, femoral, absent left dorsalis pedis, posterior tibial pulses bilaterally Musculoskeletal: No deformity or edema  Neurologic: Upper and lower extremity motor 5/5 and symmetric  DATA:  I reviewed the patient's recent CT scan dated January 28, 2019.  This showed a widely patent superior mesenteric artery and celiac artery.  There was no other significant intra-abdominal pathology noted the exception of an infarction of a portion of the inferior right kidney and some colonic thickening  I also reviewed the patient's recent left lower extremity duplex exam which suggested a narrowing proximal to her left femoropopliteal bypass.  I also visualized some narrowing of her  distal external iliac artery on the recent CT scan.  Patient had a mesenteric duplex scan at our office today which showed widely patent celiac artery.  The proximal portion of the superior mesenteric artery was patent but the distal segment was not well visualized  ASSESSMENT: Abdominal pain with weight loss unknown etiology.  Her CT scan from early June does not show any significant mesenteric artery stenosis.  However the distal superior mesenteric artery was not well visualized on our duplex scan today.  She possibly could have had some interval change in her anatomy after the CT scan.  PLAN:  The patient has an appointment scheduled with Dr. Russella Dar from GI on 30 June.  I discussed with the patient today the possibility of doing a mesenteric angiogram for further clarification of her mesenteric vessels.  She would prefer to wait until after her evaluation by Dr. Russella Dar.  We have scheduled her for her mesenteric angiogram on March 07, 2019.  We will cancel this if Dr. Russella Dar suggests that he thinks it is unnecessary or if the patient is found to have another etiology for her abdominal pain and weight loss.  Will need to stop her Eliquis peri-procedure.  At the time of her arteriogram we would also evaluate her lower extremity  bypass graft to make sure she has no significant recurrent stenosis at the proximal anastomosis or in the distal external iliac artery.   Ruta Hinds, MD [facility] Office: [phone] Pager: [phone]

## 2019-02-24 ENCOUNTER — Telehealth: Payer: Self-pay

## 2019-02-24 NOTE — Telephone Encounter (Signed)
Covid-19 screening questions   Do you now or have you had a fever in the last 14 days? No  Do you have any respiratory symptoms of shortness of breath or cough now or in the last 14 days? No  Do you have any family members or close contacts with diagnosed or suspected Covid-19 in the past 14 days? No  Have you been tested for Covid-19 and found to be positive? No        

## 2019-02-25 ENCOUNTER — Encounter: Payer: Self-pay | Admitting: Gastroenterology

## 2019-02-25 ENCOUNTER — Ambulatory Visit (INDEPENDENT_AMBULATORY_CARE_PROVIDER_SITE_OTHER): Payer: Medicare Other | Admitting: Gastroenterology

## 2019-02-25 VITALS — BP 130/72 | HR 69 | Temp 98.3°F | Ht 65.0 in | Wt 109.0 lb

## 2019-02-25 DIAGNOSIS — R933 Abnormal findings on diagnostic imaging of other parts of digestive tract: Secondary | ICD-10-CM

## 2019-02-25 DIAGNOSIS — Z7901 Long term (current) use of anticoagulants: Secondary | ICD-10-CM | POA: Diagnosis not present

## 2019-02-25 DIAGNOSIS — R634 Abnormal weight loss: Secondary | ICD-10-CM

## 2019-02-25 DIAGNOSIS — R1013 Epigastric pain: Secondary | ICD-10-CM | POA: Diagnosis not present

## 2019-02-25 NOTE — Progress Notes (Signed)
    History of Present Illness: This is a 63 year old female who relates epigastric pain, early satiety, weight loss and constipation since hospitalization in February 2020 for bilateral pyelonephritis.  She relates an 11 pound weight loss gradually over the past few months.  In the past week her appetite has returned to near normal, her constipation has improved, her epigastric pain is improved and her weight has stabilized.  CT AP below. She was evaluated by Dr. Oneida Alar with concern for mesenteric ischemia and has been scheduled for mesenteric angiogram. Denies diarrhea, change in stool caliber, melena, hematochezia, nausea, vomiting, dysphagia, chest pain.  CT AP 01/28/2019 IMPRESSION: 1. 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  2. The colon is decompressed although mildly thickened with adjacent inflammatory stranding. Findings are consistent with nonspecific infectious, inflammatory, or ischemic colitis.  3.  Severe mixed calcific atherosclerosis.  4. Unchanged enlargement of the common bile duct up to 1.0 cm. No obstructing calculus or lesion identified to the ampulla.  5.  Normal appendix.   Current Medications, Allergies, Past Medical History, Past Surgical History, Family History and Social History were reviewed in [employer] record.   Physical Exam: General: Well developed, well nourished, thin, no acute distress Head: Normocephalic and atraumatic Eyes:  sclerae anicteric, EOMI Ears: Normal auditory acuity Mouth: No deformity or lesions Lungs: Clear throughout to auscultation Heart: Regular rate and rhythm; no murmurs, rubs or bruits Abdomen: Soft, non tender and non distended. No masses, hepatosplenomegaly or hernias noted. Normal Bowel sounds Rectal: Not done Musculoskeletal: Symmetrical with no gross deformities   Pulses:  Normal pulses noted Extremities: No clubbing, cyanosis, edema or deformities noted Neurological: Alert oriented x 4, grossly nonfocal Psychological:  Alert and cooperative. Normal mood and affect   Assessment and Recommendations:  1. Weight loss, early satiety, epigastric pain, abnormal CT of colon, constipation and a new right renal infarction. All symptoms improved over the past week. Consider mesenteric ischemia or a mesenteric insult related to no anticoagulation for 1 month. Consider ulcer, gastritis, UGI neoplasm, colorectal neoplasm.  Recommended EGD and colonoscopy to further evaluate. She declines as she is feeling better this week however she will reconsider if symptoms do not fully resolve or weight gain does not occur. Pantoprazole 40 mg daily until REV. Gas-X tid prn. Miralax daily as needed. She feels she wants to cancel the mesenteric angiogram scheduled in July. Encouraged her to call if she changes her mind and wants to proceed with colonoscopy and EGD prior to REV in 6 weeks.   2. Mild CBD dilation with normal LFTs - likely a benign finding. Trend LFTs. MRI/MRCP recommended to further evaluate if LFTs elevated or GI symptoms persist without another cause found.   3. A fib on Eliquis.   4. PVD.   5. CAD, S/P CABG. History of MI.

## 2019-02-25 NOTE — Patient Instructions (Signed)
Restart your Protonix daily that you have been previously prescribed.   Start over the counter Gas-x as needed.   Thank you for choosing me and [facility].  Pricilla Riffle. Dagoberto Ligas., MD., Marval Regal

## 2019-02-27 ENCOUNTER — Telehealth: Payer: Self-pay

## 2019-02-27 NOTE — Telephone Encounter (Signed)
Pt called and said that she would like to cancel her surgery.   When asked why, she states that she is seeing several doctors and not sure if we were aware of that but wanted to get some things sorted out first. She said that she will call back when she is ready to schedule.   York Cerise, CMA

## 2019-03-04 ENCOUNTER — Inpatient Hospital Stay (HOSPITAL_COMMUNITY): Admission: RE | Admit: 2019-03-04 | Payer: Medicare Other | Source: Ambulatory Visit

## 2019-03-07 ENCOUNTER — Ambulatory Visit (HOSPITAL_COMMUNITY): Admission: RE | Admit: 2019-03-07 | Payer: Medicare Other | Source: Home / Self Care | Admitting: Vascular Surgery

## 2019-03-07 ENCOUNTER — Encounter (HOSPITAL_COMMUNITY): Admission: RE | Payer: Self-pay | Source: Home / Self Care

## 2019-03-07 SURGERY — ABDOMINAL AORTOGRAM W/LOWER EXTREMITY
Anesthesia: LOCAL

## 2019-04-18 ENCOUNTER — Telehealth: Payer: Self-pay

## 2019-04-18 NOTE — Telephone Encounter (Signed)
Pt called and said that she is having a lot of pain in her legs and has not been able to sleep. Spoke with her about canceling her surgery and if she was ready to proceed and she said no - she did not think that it was that urgent. She said that she would just like to know what to take for pain. Recommended she take OTC Tylenol 1-2 q 4 hours PRN. She states that she felt like she needed to be seen to discuss other options and see what we can do for the pain Appt made for Monday.   York Cerise, CMA

## 2019-04-19 ENCOUNTER — Other Ambulatory Visit: Payer: Self-pay | Admitting: Cardiology

## 2019-04-21 ENCOUNTER — Other Ambulatory Visit: Payer: Self-pay | Admitting: *Deleted

## 2019-04-21 ENCOUNTER — Ambulatory Visit (INDEPENDENT_AMBULATORY_CARE_PROVIDER_SITE_OTHER): Payer: Medicare Other | Admitting: Physician Assistant

## 2019-04-21 ENCOUNTER — Other Ambulatory Visit: Payer: Self-pay

## 2019-04-21 ENCOUNTER — Encounter: Payer: Self-pay | Admitting: *Deleted

## 2019-04-21 VITALS — BP 159/88 | HR 73 | Temp 98.2°F | Resp 14 | Ht 65.5 in | Wt 111.0 lb

## 2019-04-21 DIAGNOSIS — I70222 Atherosclerosis of native arteries of extremities with rest pain, left leg: Secondary | ICD-10-CM

## 2019-04-21 DIAGNOSIS — I739 Peripheral vascular disease, unspecified: Secondary | ICD-10-CM | POA: Diagnosis not present

## 2019-04-21 MED ORDER — OXYCODONE-ACETAMINOPHEN 5-325 MG PO TABS: 1.0000 | ORAL_TABLET | Freq: Four times a day (QID) | ORAL | 0 refills | Status: DC | PRN

## 2019-04-21 NOTE — Telephone Encounter (Signed)
44f 49.4kg Scr 1.09 01/31/19 Lovw/seiler 10/09/18

## 2019-04-21 NOTE — H&P (View-Only) (Signed)
04/25/2019   Established Previous Bypass   History of Present Illness   Mercedes [name] is a 63 y.o. (Jul 07, 1956) female who presents with chief complaint: Rest pain left foot increasing over the past 2 weeks.  Surgical history significant for left femoral to popliteal bypass with vein and great toe amputation by Dr. Oneida Alar in 2015.  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  Patient canceled this surgery about 2 months ago and has not return to office.  Patient now states she is only able to walk about 20 feet before having to rest and also has to hang her foot over the bed at night for relief.  She denies any active tissue ischemia of left foot.  She is taking Eliquis.  She is an everyday smoker.  Patient states she has followed up with her GI provider several times since visit and denies any ongoing abdominal pain.  She also denies any further weight loss or fear of food.  The patient's PMH, PSH, SH, and FamHx were reviewed and are unchanged from prior visit.  Current Outpatient Medications  Medication Sig Dispense Refill  . bisacodyl (DULCOLAX) 5 MG EC tablet Take 1 tablet (5 mg total) by mouth daily as needed for moderate constipation. 30 tablet 0  . ELIQUIS 5 MG TABS tablet TAKE 1 TABLET BY MOUTH TWICE DAILY 180 tablet 3  . lisinopril (PRINIVIL,ZESTRIL) 5 MG tablet TAKE 1 TABLET(5 MG) BY MOUTH DAILY. FOLLOW UP APPOINTMENT FOR FURTHER REFILLS (Patient taking differently: Take 5 mg by mouth daily as needed (if Systolic reading is 035 or greater). ) 90 tablet 3  . metoprolol tartrate (LOPRESSOR) 25 MG tablet Take 0.5 tablets (12.5 mg total) by mouth 2 (two) times daily. 180 tablet 3  . nitroGLYCERIN (NITROSTAT) 0.4 MG SL tablet Place 1 tablet (0.4 mg total) under the tongue every 5 (five) minutes as needed for chest pain. 25 tablet 3   No current facility-administered  medications for this visit.     On ROS today: 10 system ROS is negative unless otherwise noted in HPI   Physical Examination   Vitals:   04/21/19 1546  BP: (!) 159/88  Pulse: 73  Resp: 14  Temp: 98.2 F (36.8 C)  TempSrc: Temporal  SpO2: 100%  Weight: 111 lb (50.3 kg)  Height: 5' 5.5" (1.664 m)   Body mass index is 18.19 kg/m.  General Alert, O x 3, WD, NAD  Pulmonary Sym exp, good B air movt, CTA B  Cardiac RRR, Nl S1, S2  Vascular Vessel Right Left  Radial Palpable Palpable  Brachial Palpable Palpable  Aorta Not palpable N/A  Femoral Palpable Faintly palpable  Popliteal Not palpable Not palpable  PT Not palpable Not palpable  DP Not palpable Not palpable    Gastro- intestinal soft, non-distended, non-tender to palpation,   Musculo- skeletal M/S 5/5 throughout  , Left foot cooler to touch compared to right; soft left peroneal and PT; motor and sensation intact left foot; palpable right femoral pulse  Neurologic Pain and light touch intact in extremities , Motor exam as listed above     Medical Decision Making   Mercedes [name] is a 63 y.o. female who presents with rest pain LLE with history of LLE femoral to popliteal bypass with vein.   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 Darrick Penna scheduled for this Friday.  Mesenteric stenosis now asymptomatic however Dr. Darrick Penna may evaluate during aortogram.  This will also involve left lower extremity runoff with possible intervention  Patient will need to stop her Eliquis 48 hours prior to procedure.  She will be prescribed #30 5-325mg  percocet for rest pain as a bridge to surgery  Risks and benefits of angiography were discussed with the patient and patient is aware this is a limb threatening situation  Patient agrees to proceed with the above procedure on Friday with Dr. [name] PA-C [facility] Office: [phone]  Clinic MD: Dr. Myra Gianotti

## 2019-04-21 NOTE — Progress Notes (Signed)
04/25/2019   Established Previous Bypass   History of Present Illness   Mercedes [name] is a 63 y.o. (Jul 07, 1956) female who presents with chief complaint: Rest pain left foot increasing over the past 2 weeks.  Surgical history significant for left femoral to popliteal bypass with vein and great toe amputation by Dr. Oneida Alar in 2015.  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  Patient canceled this surgery about 2 months ago and has not return to office.  Patient now states she is only able to walk about 20 feet before having to rest and also has to hang her foot over the bed at night for relief.  She denies any active tissue ischemia of left foot.  She is taking Eliquis.  She is an everyday smoker.  Patient states she has followed up with her GI provider several times since visit and denies any ongoing abdominal pain.  She also denies any further weight loss or fear of food.  The patient's PMH, PSH, SH, and FamHx were reviewed and are unchanged from prior visit.  Current Outpatient Medications  Medication Sig Dispense Refill  . bisacodyl (DULCOLAX) 5 MG EC tablet Take 1 tablet (5 mg total) by mouth daily as needed for moderate constipation. 30 tablet 0  . ELIQUIS 5 MG TABS tablet TAKE 1 TABLET BY MOUTH TWICE DAILY 180 tablet 3  . lisinopril (PRINIVIL,ZESTRIL) 5 MG tablet TAKE 1 TABLET(5 MG) BY MOUTH DAILY. FOLLOW UP APPOINTMENT FOR FURTHER REFILLS (Patient taking differently: Take 5 mg by mouth daily as needed (if Systolic reading is 035 or greater). ) 90 tablet 3  . metoprolol tartrate (LOPRESSOR) 25 MG tablet Take 0.5 tablets (12.5 mg total) by mouth 2 (two) times daily. 180 tablet 3  . nitroGLYCERIN (NITROSTAT) 0.4 MG SL tablet Place 1 tablet (0.4 mg total) under the tongue every 5 (five) minutes as needed for chest pain. 25 tablet 3   No current facility-administered  medications for this visit.     On ROS today: 10 system ROS is negative unless otherwise noted in HPI   Physical Examination   Vitals:   04/21/19 1546  BP: (!) 159/88  Pulse: 73  Resp: 14  Temp: 98.2 F (36.8 C)  TempSrc: Temporal  SpO2: 100%  Weight: 111 lb (50.3 kg)  Height: 5' 5.5" (1.664 m)   Body mass index is 18.19 kg/m.  General Alert, O x 3, WD, NAD  Pulmonary Sym exp, good B air movt, CTA B  Cardiac RRR, Nl S1, S2  Vascular Vessel Right Left  Radial Palpable Palpable  Brachial Palpable Palpable  Aorta Not palpable N/A  Femoral Palpable Faintly palpable  Popliteal Not palpable Not palpable  PT Not palpable Not palpable  DP Not palpable Not palpable    Gastro- intestinal soft, non-distended, non-tender to palpation,   Musculo- skeletal M/S 5/5 throughout  , Left foot cooler to touch compared to right; soft left peroneal and PT; motor and sensation intact left foot; palpable right femoral pulse  Neurologic Pain and light touch intact in extremities , Motor exam as listed above     Medical Decision Making   Mercedes [name] is a 63 y.o. female who presents with rest pain LLE with history of LLE femoral to popliteal bypass with vein.   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 Fields scheduled for this Friday.  Mesenteric stenosis now asymptomatic however Dr. Fields may evaluate during aortogram.  This will also involve left lower extremity runoff with possible intervention  Patient will need to stop her Eliquis 48 hours prior to procedure.  She will be prescribed #30 5-325mg percocet for rest pain as a bridge to surgery  Risks and benefits of angiography were discussed with the patient and patient is aware this is a limb threatening situation  Patient agrees to proceed with the above procedure on Friday with Dr. Fields    Shakthi Scipio PA-C [facility] Office: [phone]  Clinic MD: Dr. Brabham  

## 2019-04-22 ENCOUNTER — Other Ambulatory Visit (HOSPITAL_COMMUNITY)
Admission: RE | Admit: 2019-04-22 | Discharge: 2019-04-22 | Disposition: A | Discharge status: Home / Self Care | Payer: Medicare Other | Source: Ambulatory Visit | Attending: Vascular Surgery | Admitting: Vascular Surgery

## 2019-04-22 DIAGNOSIS — Z01812 Encounter for preprocedural laboratory examination: Secondary | ICD-10-CM | POA: Insufficient documentation

## 2019-04-22 DIAGNOSIS — Z20828 Contact with and (suspected) exposure to other viral communicable diseases: Secondary | ICD-10-CM | POA: Diagnosis not present

## 2019-04-22 LAB — SARS CORONAVIRUS 2 (TAT 6-24 HRS): SARS Coronavirus 2: NEGATIVE

## 2019-04-25 ENCOUNTER — Ambulatory Visit (HOSPITAL_COMMUNITY)
Admission: RE | Admit: 2019-04-25 | Discharge: 2019-04-25 | Disposition: A | Discharge status: Home / Self Care | Payer: Medicare Other | Attending: Vascular Surgery | Admitting: Vascular Surgery

## 2019-04-25 ENCOUNTER — Ambulatory Visit (HOSPITAL_COMMUNITY)
Admission: RE | Disposition: A | Discharge status: Home / Self Care | Payer: Self-pay | Source: Home / Self Care | Attending: Vascular Surgery

## 2019-04-25 ENCOUNTER — Other Ambulatory Visit: Payer: Self-pay

## 2019-04-25 ENCOUNTER — Other Ambulatory Visit: Payer: Self-pay | Admitting: *Deleted

## 2019-04-25 DIAGNOSIS — R109 Unspecified abdominal pain: Secondary | ICD-10-CM | POA: Insufficient documentation

## 2019-04-25 DIAGNOSIS — Z79899 Other long term (current) drug therapy: Secondary | ICD-10-CM | POA: Insufficient documentation

## 2019-04-25 DIAGNOSIS — Z7901 Long term (current) use of anticoagulants: Secondary | ICD-10-CM | POA: Diagnosis not present

## 2019-04-25 DIAGNOSIS — I70222 Atherosclerosis of native arteries of extremities with rest pain, left leg: Secondary | ICD-10-CM | POA: Insufficient documentation

## 2019-04-25 DIAGNOSIS — M79672 Pain in left foot: Secondary | ICD-10-CM | POA: Insufficient documentation

## 2019-04-25 HISTORY — PX: ABDOMINAL AORTOGRAM W/LOWER EXTREMITY: CATH118223

## 2019-04-25 HISTORY — PX: VISCERAL ANGIOGRAPHY: CATH118276

## 2019-04-25 LAB — POCT I-STAT, CHEM 8
BUN: 12 mg/dL (ref 8–23)
Calcium, Ion: 1.26 mmol/L (ref 1.15–1.40)
Chloride: 105 mmol/L (ref 98–111)
Creatinine, Ser: 0.8 mg/dL (ref 0.44–1.00)
Glucose, Bld: 98 mg/dL (ref 70–99)
HCT: 48 % — ABNORMAL HIGH (ref 36.0–46.0)
Hemoglobin: 16.3 g/dL — ABNORMAL HIGH (ref 12.0–15.0)
Potassium: 4.4 mmol/L (ref 3.5–5.1)
Sodium: 139 mmol/L (ref 135–145)
TCO2: 24 mmol/L (ref 22–32)

## 2019-04-25 SURGERY — VISCERAL ANGIOGRAPHY
Anesthesia: LOCAL

## 2019-04-25 MED ORDER — HEPARIN (PORCINE) IN NACL 1000-0.9 UT/500ML-% IV SOLN
INTRAVENOUS | Status: DC | PRN
  Administered 2019-04-25 (×2): 500 mL

## 2019-04-25 MED ORDER — FENTANYL CITRATE (PF) 100 MCG/2ML IJ SOLN
INTRAMUSCULAR | Status: DC | PRN
  Administered 2019-04-25: 25 ug via INTRAVENOUS

## 2019-04-25 MED ORDER — LABETALOL HCL 5 MG/ML IV SOLN: 10.0000 mg | INTRAVENOUS | Status: DC | PRN

## 2019-04-25 MED ORDER — MORPHINE SULFATE (PF) 10 MG/ML IV SOLN: 2.0000 mg | INTRAVENOUS | Status: DC | PRN

## 2019-04-25 MED ORDER — IODIXANOL 320 MG/ML IV SOLN
INTRAVENOUS | Status: DC | PRN
  Administered 2019-04-25: 14:00:00 147 mL via INTRA_ARTERIAL

## 2019-04-25 MED ORDER — SODIUM CHLORIDE 0.9 % IV SOLN: INTRAVENOUS | Status: DC

## 2019-04-25 MED ORDER — ACETAMINOPHEN 325 MG PO TABS: 650.0000 mg | ORAL_TABLET | ORAL | Status: DC | PRN

## 2019-04-25 MED ORDER — SODIUM CHLORIDE 0.9 % IV SOLN: 250.0000 mL | INTRAVENOUS | Status: DC | PRN

## 2019-04-25 MED ORDER — ONDANSETRON HCL 4 MG/2ML IJ SOLN: 4.0000 mg | Freq: Four times a day (QID) | INTRAMUSCULAR | Status: DC | PRN

## 2019-04-25 MED ORDER — SODIUM CHLORIDE 0.9% FLUSH: 3.0000 mL | INTRAVENOUS | Status: DC | PRN

## 2019-04-25 MED ORDER — HEPARIN (PORCINE) IN NACL 1000-0.9 UT/500ML-% IV SOLN
INTRAVENOUS | Status: AC
  Filled 2019-04-25: qty 500

## 2019-04-25 MED ORDER — LIDOCAINE HCL (PF) 1 % IJ SOLN
INTRAMUSCULAR | Status: DC | PRN
  Administered 2019-04-25: 15 mL via INTRADERMAL

## 2019-04-25 MED ORDER — LIDOCAINE HCL (PF) 1 % IJ SOLN
INTRAMUSCULAR | Status: AC
  Filled 2019-04-25: qty 30

## 2019-04-25 MED ORDER — OXYCODONE HCL 5 MG PO TABS: 5.0000 mg | ORAL_TABLET | ORAL | Status: DC | PRN

## 2019-04-25 MED ORDER — HYDRALAZINE HCL 20 MG/ML IJ SOLN
INTRAMUSCULAR | Status: AC
  Filled 2019-04-25: qty 1

## 2019-04-25 MED ORDER — FENTANYL CITRATE (PF) 100 MCG/2ML IJ SOLN
INTRAMUSCULAR | Status: AC
  Filled 2019-04-25: qty 2

## 2019-04-25 MED ORDER — HYDRALAZINE HCL 20 MG/ML IJ SOLN: 5.0000 mg | INTRAMUSCULAR | Status: DC | PRN

## 2019-04-25 MED ORDER — SODIUM CHLORIDE 0.9 % IV SOLN: INTRAVENOUS | Status: AC

## 2019-04-25 MED ORDER — SODIUM CHLORIDE 0.9% FLUSH: 3.0000 mL | Freq: Two times a day (BID) | INTRAVENOUS | Status: DC

## 2019-04-25 SURGICAL SUPPLY — 8 items
CATH ANGIO 5F PIGTAIL 65CM (CATHETERS) ×1 IMPLANT
KIT PV (KITS) ×2 IMPLANT
SHEATH PINNACLE 5F 10CM (SHEATH) ×1 IMPLANT
SHEATH PROBE COVER 6X72 (BAG) ×1 IMPLANT
SYR MEDRAD MARK V 150ML (SYRINGE) ×1 IMPLANT
TRANSDUCER W/STOPCOCK (MISCELLANEOUS) ×2 IMPLANT
TRAY PV CATH (CUSTOM PROCEDURE TRAY) ×2 IMPLANT
WIRE HITORQ VERSACORE ST 145CM (WIRE) ×1 IMPLANT

## 2019-04-25 NOTE — Progress Notes (Signed)
Site Area: right femoral Site Prior to Removal: Level 0 Pressure Applied For:  20 minutes Manual: yes Patient Status During Pull: stable Post Pull Site: Level 0 Post Pull Instructions Given: YES Post Pull Pulses Present: peroneal/AT dopplered on right; none palpable or dopplered on left Dressing Applied: gauze and tegaderm Bedrest begins @ 1420  Comments:  Removed by  Verta Ellen, RN, RCIS

## 2019-04-25 NOTE — Interval H&P Note (Signed)
History and Physical Interval Note:  04/25/2019 11:04 AM  Mercedes [name]  has presented today for surgery, with the diagnosis of PVD.  The various methods of treatment have been discussed with the patient and family. After consideration of risks, benefits and other options for treatment, the patient has consented to  Procedure(s): MESENTRIC  ANGIOGRAPHY (N/A) ABDOMINAL AORTOGRAM W/LOWER EXTREMITY (N/A) as a surgical intervention.  The patient's history has been reviewed, patient examined, no change in status, stable for surgery.  I have reviewed the patient's chart and labs.  Questions were answered to the patient's satisfaction.     Ruta Hinds

## 2019-04-25 NOTE — Discharge Instructions (Addendum)
Okay to resume Eliquis tomorrow morning  Our office will call you to schedule ultrasounds to look for veins in your arms for possible bypass.  Your bypass is tentatively scheduled for May 12, 2019.  We will also arrange for a cardiologist to see you preoperatively.   Femoral Site Care This sheet gives you information about how to care for yourself after your procedure. Your health care provider may also give you more specific instructions. If you have problems or questions, contact your health care provider. What can I expect after the procedure? After the procedure, it is common to have:  Bruising that usually fades within 1-2 weeks.  Tenderness at the site. Follow these instructions at home: Wound care  Follow instructions from your health care provider about how to take care of your insertion site. Make sure you: ? Wash your hands with soap and water before you change your bandage (dressing). If soap and water are not available, use hand sanitizer. ? Remove your dressing as told by your health care provider. In 24-48 hours   Do not take baths, swim, or use a hot tub until your health care provider approves.  You may shower 24-48 hours after the procedure or as told by your health care provider. ? Gently wash the site with plain soap and water. ? Pat the area dry with a clean towel. ? Do not rub the site. This may cause bleeding.  Do not apply powder or lotion to the site. Keep the site clean and dry.  Check your femoral site every day for signs of infection. Check for: ? Redness, swelling, or pain. ? Fluid or blood. ? Warmth. ? Pus or a bad smell. Activity  For the first 2-3 days after your procedure, or as long as directed: ? Avoid climbing stairs as much as possible. ? Do not squat.  Do not lift anything that is heavier than 10 lb (4.5 kg), or the limit that you are told, until your health care provider says that it is safe. For 5 days  Rest as directed. ? Avoid  sitting for a long time without moving. Get up to take short walks every 1-2 hours.  Do not drive for 24 hours if you were given a medicine to help you relax (sedative). General instructions  Take over-the-counter and prescription medicines only as told by your health care provider.  Keep all follow-up visits as told by your health care provider. This is important. Contact a health care provider if you have:  A fever or chills.  You have redness, swelling, or pain around your insertion site. Get help right away if:  The catheter insertion area swells very fast.  You pass out.  You suddenly start to sweat or your skin gets clammy.  The catheter insertion area is bleeding, and the bleeding does not stop when you hold steady pressure on the area.  The area near or just beyond the catheter insertion site becomes pale, cool, tingly, or numb. These symptoms may represent a serious problem that is an emergency. Do not wait to see if the symptoms will go away. Get medical help right away. Call your local emergency services (911 in the [place] Do not drive yourself to the hospital. Summary  After the procedure, it is common to have bruising that usually fades within 1-2 weeks.  Check your femoral site every day for signs of infection.  Do not lift anything that is heavier than 10 lb (4.5 kg), or the limit  that you are told, until your health care provider says that it is safe. This information is not intended to replace advice given to you by your health care provider. Make sure you discuss any questions you have with your health care provider. Document Released: 04/17/2014 Document Revised: 08/27/2017 Document Reviewed: 08/27/2017 Elsevier Patient Education  2020 [employer].

## 2019-04-25 NOTE — Op Note (Signed)
Procedure: Abdominal aortogram, mesenteric angiogram, bilateral lower extremity runoff  Preoperative diagnosis: #1 abdominal pain  2.  Rest pain left foot  Postoperative diagnosis: Same  Anesthesia: Local  Operative findings: #1 widely patent superior mesenteric celiac and inferior mesenteric arteries no significant flow-limiting stenosis  2.  Left leg left internal iliac common femoral superficial femoral profunda popliteal arteries all occluded.  She reconstitutes a very small peroneal near its origin.  3.  Right lower extremity right superficial femoral artery is occluded below-knee popliteal artery reconstitutes via collaterals one-vessel runoff to the right foot  Operative details: After obtaining form consent, the patient taken the [place] lab.  The patient placed in supine position Angie table.  Both groins were prepped and draped in usual sterile fashion.  Ultrasound was used to identify the right common femoral artery and femoral bifurcation.  Image was obtained for the patient's record.  These were patent.  After infiltrating local anesthesia around the artery, an introducer needle was used to cannulate the right common femoral artery and an 035 versa core wire threaded up the abdominal aorta under fluoroscopic guidance.  Next a 5 French sheath was placed in the right common femoral artery.  This was thoroughly flushed with heparinized saline.  5 French pigtail catheter was then advanced over the guidewire up in the upper portion of the abdominal aorta.  AP and lateral aortogram was obtained to evaluate the mesenteric circulation.  The celiac and superior mesenteric and inferior mesenteric arteries are all widely patent.  The left and right renal arteries are also widely patent.  The infrarenal abdominal aorta is patent.  Next the pigtail catheter was pulled down just above the aortic bifurcation and bilateral lower extremity runoff views were obtained through the pigtail catheter.  In the  left leg the left common iliac and external iliac arteries are patent.  The left internal iliac artery is occluded.  The left common femoral artery is occluded.  The left superficial femoral and profunda are occluded.  The left popliteal artery is occluded.  The anterior tibial and posterior tibial arteries are occluded.  Patient has one-vessel runoff via a very small peroneal which reconstitutes near its origin.  In the right lower extremity, the right common common iliac and external iliac is patent.  The right internal iliac artery is patent but small and diseased.  The right common femoral artery and profundus patent.  The right superficial femoral artery occludes.  The below-knee popliteal artery does reconstitute via collaterals.  There is one-vessel runoff to the right foot.    This point pigtail catheter was removed over guidewire.  The 5 French sheath was left in place to pulled the holding area.  The patient tolerated procedure well and there were no complications.  Operative management: Patient has no significant mesenteric artery occlusive disease.  She will continue to follow-up with her GI doctor if she has recurrent abdominal symptoms.  As far as her left leg is concerned she has significant pathology in the left lower extremity and would need a redo left common femoral endarterectomy and a left femoral to peroneal artery bypass.  Unfortunately she has no greater saphenous vein left at this point.  We will do vein mapping of her upper extremities to see if she has arm vein that might be suitable conduit.  Otherwise we may consider cadaveric vein or PTFE.  She is at very high risk of limb loss.  Her redo bypass is scheduled for May 12, 2019.  She will  have cardiac risk stratification prior to this.  Fabienne Bruns, MD [facility] Office: [phone] Pager: [phone]

## 2019-04-28 ENCOUNTER — Encounter (HOSPITAL_COMMUNITY): Payer: Self-pay | Admitting: Vascular Surgery

## 2019-04-28 ENCOUNTER — Telehealth: Payer: Self-pay | Admitting: *Deleted

## 2019-04-28 ENCOUNTER — Telehealth: Payer: Self-pay | Admitting: Cardiology

## 2019-04-28 MED FILL — Hydralazine HCl Inj 20 MG/ML: INTRAMUSCULAR | Qty: 1 | Status: AC

## 2019-04-28 NOTE — Telephone Encounter (Signed)
1) What type of surgery is being performed?  Redo of femoral to peroneal bypass  2) When is this surgery scheduled? 05-12-2019   3) What type of clearance is required (Medical, Pharmacy or Both)? pharmacy   4) Are there any medications that need to be held prior to surgery and how long? ELIQUIS  Hold 48 hours prior to surgery  5) Practice name and name of physician performing surgery?  [facility] - Dr. Ruta Hinds  6) What is your office phone number?  Leota Jacobsen, BSN RN Surgical / Triage Nurse [facility]   [phone]

## 2019-04-28 NOTE — Telephone Encounter (Signed)
Patient with diagnosis of afib on Eliquis for anticoagulation.    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 I will defer final decision to hold anticoagulation 48 hours prior to procedure to the MD

## 2019-04-28 NOTE — Telephone Encounter (Signed)
   Primary Cardiologist:Brian Stanford Breed, MD  Chart reviewed as part of pre-operative protocol coverage. Because of Mercedes [name]'s past medical history and time since last visit, he/she will require a follow-up visit in order to better assess preoperative cardiovascular risk.  Pre-op covering staff: - Please schedule appointment and call patient to inform them. - Please contact requesting surgeon's office via preferred method (i.e, phone, fax) to inform them of need for appointment prior to surgery.  If applicable, this message will also be routed to pharmacy pool and/or primary cardiologist for input on holding anticoagulant/antiplatelet agent as requested below so that this information is available at time of patient's appointment.   Lyda Jester, PA-C  04/28/2019, 4:13 PM

## 2019-04-28 NOTE — Telephone Encounter (Signed)
Okay to hold apixaban 2 days prior to procedure and resume after when okay with surgery.

## 2019-04-28 NOTE — Telephone Encounter (Signed)
° °  [facility] Pre-operative Risk Assessment    Request for surgical clearance:  1. What type of surgery is being performed? Left Femoral  To Peroneal By-Pass   2. When is this surgery scheduled? 05-12-19   3. What type of clearance is required (medical clearance vs. Pharmacy clearance to hold med vs. Both)? Medical   4. Are there any medications that need to be held prior to surgery and how long?  5. Practice name and name of physician performing surgery?  Dr Ruta Hinds   6. What is your office phone number [phone]*    7.   What is your office fax [phone] Att:Kay- Please fax instead of putting in EPIC  8.   Anesthesia type (None, local, MAC, general) ? Undecided*   Mercedes [name] 04/28/2019, 2:51 PM  _________________________________________________________________   (provider comments below)

## 2019-04-29 NOTE — Telephone Encounter (Signed)
Pt has been scheduled to see Loma Boston, PA-C, 05/07/2019. Will route back to requesting surgeon's office to make them aware clearance will be addressed at that time.

## 2019-04-29 NOTE — Telephone Encounter (Signed)
Patient was scheduled for office visit with Kerin Ransom, PA-C, on 05/07/2019. Will route to [facility] so he is aware and remove patient from pre-op pool.

## 2019-04-30 NOTE — Pre-Procedure Instructions (Signed)
Mercedes [name]  04/30/2019      [id number] Mercedes [name], [place] - [address] AT [facility] OF [address] [place] [address] Phone: [phone] Fax: [phone]    Your procedure is scheduled on Sept. 14  Report to [facility] Entrance A at 5:30 A.M.  Call this number if you have problems the morning of surgery:  [phone]   Remember:  Do not eat or drink after midnight.      Take these medicines the morning of surgery with A SIP OF WATER :              Metoprolol (lopressor)             Nitroglycerine if needed             Oxycodone if needed             STOP ELIQUIS 2 DAYS PRIOR TO SURGERY             7 days prior to surgery STOP taking any Aspirin (unless otherwise instructed by your surgeon), Aleve, Naproxen, Ibuprofen, Motrin, Advil, Goody's, BC's, all herbal medications, fish oil, and all vitamins.    Do not wear jewelry, make-up or nail polish.  Do not wear lotions, powders, or perfumes, or deodorant.  Do not shave 48 hours prior to surgery.  Men may shave face and neck.  Do not bring valuables to the hospital.  [facility] is not responsible for any belongings or valuables.  Contacts, dentures or bridgework may not be worn into surgery.  Leave your suitcase in the car.  After surgery it may be brought to your room.  For patients admitted to the hospital, discharge time will be determined by your treatment team.  Patients discharged the day of surgery will not be allowed to drive home.    Special instructions:  Mercedes [name]- Preparing For Surgery  Before surgery, you can play an important role. Because skin is not sterile, your skin needs to be as free of germs as possible. You can reduce the number of germs on your skin by washing with CHG (chlorahexidine gluconate) Soap before surgery.  CHG is an antiseptic cleaner which kills germs and bonds with the skin to continue killing germs even  after washing.    Oral Hygiene is also important to reduce your risk of infection.  Remember - BRUSH YOUR TEETH THE MORNING OF SURGERY WITH YOUR REGULAR TOOTHPASTE  Please do not use if you have an allergy to CHG or antibacterial soaps. If your skin becomes reddened/irritated stop using the CHG.  Do not shave (including legs and underarms) for at least 48 hours prior to first CHG shower. It is OK to shave your face.  Please follow these instructions carefully.   1. Shower the NIGHT BEFORE SURGERY and the MORNING OF SURGERY with CHG.   2. If you chose to wash your hair, wash your hair first as usual with your normal shampoo.  3. After you shampoo, rinse your hair and body thoroughly to remove the shampoo.  4. Use CHG as you would any other liquid soap. You can apply CHG directly to the skin and wash gently with a scrungie or a clean washcloth.   5. Apply the CHG Soap to your body ONLY FROM THE NECK DOWN.  Do not use on open wounds or open sores. Avoid contact with your eyes, ears, mouth and genitals (  private parts). Wash Face and genitals (private parts)  with your normal soap.  6. Wash thoroughly, paying special attention to the area where your surgery will be performed.  7. Thoroughly rinse your body with warm water from the neck down.  8. DO NOT shower/wash with your normal soap after using and rinsing off the CHG Soap.  9. Pat yourself dry with a CLEAN TOWEL.  10. Wear CLEAN PAJAMAS to bed the night before surgery, wear comfortable clothes the morning of surgery  11. Place CLEAN SHEETS on your bed the night of your first shower and DO NOT SLEEP WITH PETS.    Day of Surgery:  Do not apply any deodorants/lotions.  Please wear clean clothes to the hospital/surgery center.   Remember to brush your teeth WITH YOUR REGULAR TOOTHPASTE.    Please read over the following fact sheets that you were given. Coughing and Deep Breathing, MRSA Information and Surgical Site Infection  Prevention

## 2019-05-01 ENCOUNTER — Encounter (HOSPITAL_COMMUNITY): Payer: Self-pay

## 2019-05-01 ENCOUNTER — Encounter (HOSPITAL_COMMUNITY)
Admission: RE | Admit: 2019-05-01 | Discharge: 2019-05-01 | Disposition: A | Discharge status: Home / Self Care | Payer: Medicare Other | Source: Ambulatory Visit | Attending: Vascular Surgery | Admitting: Vascular Surgery

## 2019-05-01 ENCOUNTER — Other Ambulatory Visit: Payer: Self-pay

## 2019-05-01 DIAGNOSIS — Z01812 Encounter for preprocedural laboratory examination: Secondary | ICD-10-CM | POA: Insufficient documentation

## 2019-05-01 LAB — SURGICAL PCR SCREEN
MRSA, PCR: NEGATIVE
Staphylococcus aureus: NEGATIVE

## 2019-05-01 LAB — COMPREHENSIVE METABOLIC PANEL
ALT: 10 U/L (ref 0–44)
AST: 16 U/L (ref 15–41)
Albumin: 3.4 g/dL — ABNORMAL LOW (ref 3.5–5.0)
Alkaline Phosphatase: 80 U/L (ref 38–126)
Anion gap: 9 (ref 5–15)
BUN: 15 mg/dL (ref 8–23)
CO2: 23 mmol/L (ref 22–32)
Calcium: 9.4 mg/dL (ref 8.9–10.3)
Chloride: 103 mmol/L (ref 98–111)
Creatinine, Ser: 1 mg/dL (ref 0.44–1.00)
GFR calc Af Amer: >60 mL/min (ref >60)
GFR calc non Af Amer: 59 mL/min — ABNORMAL LOW (ref >60)
Glucose, Bld: 112 mg/dL — ABNORMAL HIGH (ref 70–99)
Potassium: 4.3 mmol/L (ref 3.5–5.1)
Sodium: 135 mmol/L (ref 135–145)
Total Bilirubin: 0.5 mg/dL (ref 0.3–1.2)
Total Protein: 8.1 g/dL (ref 6.5–8.1)

## 2019-05-01 LAB — URINALYSIS, ROUTINE W REFLEX MICROSCOPIC
Bilirubin Urine: NEGATIVE
Glucose, UA: NEGATIVE mg/dL
Hgb urine dipstick: NEGATIVE
Ketones, ur: NEGATIVE mg/dL
Leukocytes,Ua: NEGATIVE
Nitrite: NEGATIVE
Protein, ur: NEGATIVE mg/dL
Specific Gravity, Urine: 1.018 (ref 1.005–1.030)
pH: 5 (ref 5.0–8.0)

## 2019-05-01 LAB — CBC
HCT: 44.1 % (ref 36.0–46.0)
Hemoglobin: 13.3 g/dL (ref 12.0–15.0)
MCH: 24.5 pg — ABNORMAL LOW (ref 26.0–34.0)
MCHC: 30.2 g/dL (ref 30.0–36.0)
MCV: 81.4 fL (ref 80.0–100.0)
Platelets: 358 10*3/uL (ref 150–400)
RBC: 5.42 MIL/uL — ABNORMAL HIGH (ref 3.87–5.11)
RDW: 16.4 % — ABNORMAL HIGH (ref 11.5–15.5)
WBC: 6.5 10*3/uL (ref 4.0–10.5)
nRBC: 0 % (ref 0.0–0.2)

## 2019-05-01 NOTE — Progress Notes (Addendum)
PCP - Billey Gosling Cardiologist - [place], Dr. Bronwen Betters pacemaker  Chest x-ray -02/02/19 EKG - 10/20/18 Stress Test -  ECHO - 11/17 Cardiac Cath - 11/17  Sleep Study - na CPAP -   Fasting Blood Sugar - an Checks Blood Sugar _____ times a day  Blood Thinner Instructions: to stop 2 days prior to surgery Aspirin Instructions:  Anesthesia review: cardiac hx., pt. Has wireless permanent  pacemaker- [employer]  Patient denies shortness of breath, fever, cough and chest pain at PAT appointment   Patient verbalized understanding of instructions that were given to them at the PAT appointment. Patient was also instructed that they will need to review over the PAT instructions again at home before surgery.  [employer] Rep. Mickel Baas notified of pt's surgery date and time. Device order sheet faxed to the device clinic. Mickel Baas stated to call them if needed.

## 2019-05-07 ENCOUNTER — Encounter: Payer: Self-pay | Admitting: Cardiology

## 2019-05-07 ENCOUNTER — Other Ambulatory Visit: Payer: Self-pay

## 2019-05-07 ENCOUNTER — Ambulatory Visit (INDEPENDENT_AMBULATORY_CARE_PROVIDER_SITE_OTHER): Payer: Medicare Other | Admitting: General Practice

## 2019-05-07 VITALS — BP 142/64 | HR 71 | Ht 65.5 in | Wt 107.0 lb

## 2019-05-07 DIAGNOSIS — N189 Chronic kidney disease, unspecified: Secondary | ICD-10-CM

## 2019-05-07 DIAGNOSIS — I739 Peripheral vascular disease, unspecified: Secondary | ICD-10-CM

## 2019-05-07 DIAGNOSIS — Z7409 Other reduced mobility: Secondary | ICD-10-CM

## 2019-05-07 DIAGNOSIS — I6523 Occlusion and stenosis of bilateral carotid arteries: Secondary | ICD-10-CM

## 2019-05-07 DIAGNOSIS — Z01818 Encounter for other preprocedural examination: Secondary | ICD-10-CM | POA: Diagnosis not present

## 2019-05-07 DIAGNOSIS — I10 Essential (primary) hypertension: Secondary | ICD-10-CM | POA: Diagnosis not present

## 2019-05-07 DIAGNOSIS — I480 Paroxysmal atrial fibrillation: Secondary | ICD-10-CM

## 2019-05-07 DIAGNOSIS — I2510 Atherosclerotic heart disease of native coronary artery without angina pectoris: Secondary | ICD-10-CM

## 2019-05-07 MED ORDER — LISINOPRIL 10 MG PO TABS: 10.0000 mg | ORAL_TABLET | Freq: Every day | ORAL | 2 refills | Status: DC

## 2019-05-07 NOTE — Progress Notes (Signed)
Cardiology Clinic Note   Patient Name: Mercedes ParkinsRhonda L [name] Date of Encounter: 05/07/2019  Primary Care Provider:  Pincus SanesBurns, Stacy J, MD Primary Cardiologist:  Olga MillersBrian Crenshaw, MD  Patient Profile   Mercedes [name] presents today for preop cardiac evaluation prior to redo left common femoral endarterectomy and left femoral to peroneal artery bypass.  Past Medical History    Past Medical History:  Diagnosis Date  . Abnormal LFTs    a. in the past when on Lipitor  . Anemia    as a young woman  . Anxiety   . Arnold-Chiari malformation, type I (HCC)    MRI brain 02/2010  . CAD (coronary artery disease)    a. NSTEMI 07/2009: LHC - D2 40%, LAD irreg., EF 50% with apical AK (tako-tsubo CM);  b. Inf STEMI (04/2013): Tx Promus DES to CFX;  c. 08/2013 Lexi CL: No ischemia, dist ant/ap/inf-ap infarct, EF  d. 06/2016 NSTEMI with LM disease: s/p CABG on 07/17/2016 w/ LIMA-LAD, SVG-OM, and SVG-dRCA.  [name]. DEPRESSION   . Dizziness    ? CVA 01/2010 - carotid dopplers with no ICA stenosis  . GERD (gastroesophageal reflux disease)   . HYPERLIPIDEMIA   . HYPERTENSION   . MI (myocardial infarction) (HCC)   . PAD (peripheral artery disease) (HCC)    s/p L fem pop 11/2013 in setting of 1st toe osteo/gangrene  . Paroxysmal atrial fibrillation (HCC)    a. anticoagulated with Eliquis 10/2014  . 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 RA (rheumatoid arthritis) (HCC)    prior tx by Dr. Dareen PianoAnderson  . Sjogren's syndrome (HCC) 06/02/13   pt denies this (12/01/13)  . SLE (systemic lupus erythematosus) (HCC) dx 05/2013   follows with rheum Dareen Piano(Anderson)  . Tachy-brady syndrome (HCC)    a. s/p STJ Leadless pacemaker 11-04-2014 Dr Johney FrameAllred  . 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 ABDOMINAL AORTAGRAM N/A 11/24/2013   Procedure: ABDOMINAL AORTAGRAM WITH BILATERAL  RUNOFF WITH POSSIBLE INTERVENTION;  Surgeon: Chuck Hinthristopher S Dickson, MD;  [facility];  Service: Cardiovascular;  Laterality: N/A;  . ABDOMINAL AORTOGRAM W/LOWER EXTREMITY N/A 04/25/2019   Procedure: ABDOMINAL AORTOGRAM W/LOWER EXTREMITY;  Surgeon: Sherren KernsFields, Charles E, MD;  [facility]: [facility] INVASIVE CV LAB;  Service: Cardiovascular;  Laterality: N/A;  . AMPUTATION Left 12/02/2013   Procedure: LEFT 1ST TOE AMPUTATION;  Surgeon: Sherren Kernsharles E Fields, MD;  [facility];  Service: Vascular;  Laterality: Left;  . CARDIAC CATHETERIZATION N/A 07/12/2016   Procedure: Left Heart Cath and Coronary Angiography;  Surgeon: Lennette Biharihomas A Kelly, MD;  [facility];  Service: Cardiovascular;  Laterality: N/A;  . CARDIAC CATHETERIZATION N/A 07/14/2016   Procedure: Intravascular Ultrasound/IVUS;  Surgeon: Yvonne Kendallhristopher End, MD;  Location: [facility] INVASIVE CV LAB;  Service: Cardiovascular;  Laterality: N/A;  . COLONOSCOPY    . CORONARY ANGIOPLASTY  04/2013  . CORONARY ARTERY BYPASS GRAFT N/A 07/17/2016   Procedure: CORONARY ARTERY BYPASS GRAFTING (CABG)x3 using right greater saphenous vein harvested endoscopiclly and left internal mammary artery;  Surgeon: Kerin PernaPeter Van Trigt, MD;  [facility];  Service: Open Heart Surgery;  Laterality: N/A;  . FEMORAL-POPLITEAL BYPASS GRAFT Left 12/02/2013   Procedure:  LEFT FEMORAL-BELOW KNEE POPLITEAL ARTERY BYPASS GRAFT;  Surgeon: Sherren Kernsharles E Fields, MD;  [facility];  Service: Vascular;  Laterality: Left;  .  LEFT HEART CATHETERIZATION WITH CORONARY ANGIOGRAM N/A 05/19/2013   Procedure: LEFT HEART CATHETERIZATION WITH CORONARY ANGIOGRAM;  Surgeon: Micheline Chapman, MD;  Location: [facility] CATH LAB;  Service: Cardiovascular;  Laterality: N/A;  . LEFT HEART CATHETERIZATION WITH CORONARY ANGIOGRAM N/A 10/28/2014   Procedure: LEFT HEART CATHETERIZATION WITH CORONARY ANGIOGRAM;  Surgeon: Iran Ouch, MD;  [facility]: [facility] CATH [facility];  Service: Cardiovascular;  Laterality: N/A;  . PERCUTANEOUS  CORONARY STENT INTERVENTION (PCI-S)  05/19/2013   Procedure: PERCUTANEOUS CORONARY STENT INTERVENTION (PCI-S);  Surgeon: Micheline Chapman, MD;  Location: [facility] CATH LAB;  Service: Cardiovascular;;  . PERMANENT PACEMAKER INSERTION N/A 11/04/2014   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  . TEE WITHOUT CARDIOVERSION N/A 07/17/2016   Procedure: TRANSESOPHAGEAL ECHOCARDIOGRAM (TEE);  Surgeon: Kerin Perna, MD;  Location: [facility] OR;  Service: Open Heart Surgery;  Laterality: N/A;  . TUBAL LIGATION    . VISCERAL ANGIOGRAPHY N/A 04/25/2019   Procedure: MESENTRIC  ANGIOGRAPHY;  Surgeon: Sherren Kerns, MD;  Location[facility] INVASIVE CV LAB;  Service: Cardiovascular;  Laterality: N/A;    Allergies  Allergies  Allergen Reactions  . Brilinta [Ticagrelor] Anaphylaxis and Other (See Comments)    Also, chest tightness  . Latex Rash    History of Present Illness    Mercedes [name] was last seen by Dr. Jens Som on 03/01/2018.  During that time she was doing well, denied chest pain or dyspnea and had had no syncopal symptoms.  She did however state that she noticed fatigue and had lost around 10 pounds.    She was admitted to the hospital on 11/17 underwent cardiac catheterization that showed a patent left circumflex stent and evidence of focal thrombus through her distal LM and ostial LAD.  She was placed on Aggrastat and a staged cath was performed on 11/17 that showed a 40- 50% distal LMCA haziness and a 70 to 80% ostial LAD stenosis.  She had a CABG procedure on 07/17/2016 with LIMA to LAD, SVG to OM, and SVG to distal RCA.  Her echocardiogram on November 2017 showed LVEF of 55 to 60%, grade 2 diastolic dysfunction, mild left atrial enlargement, mild mitral regurgitation, and moderate tricuspid regurgitation.  Her carotid Dopplers on November 2018 showed 1 to 39% right and 40 to 59% left stenosis.  Her PMH also includes PVD (s/p L.  femoropopliteal bypass 11/2013) PAF, tachybradycardia syndrome  (s/p PPM placement 10/2014, [employer] leadless) and hyperlipidemia.  She presents to the clinic and states she has been having increasing leg pain since middle of June.  She states that in the spring she was still able to do housework plant her garden.  However, she struggles at this point to do sustained physical activity due to her left leg pain.  She is taking Percocet 5-325 every 6 hours for pain.  She denies chest pain, shortness of breath, lower extremity edema,  palpitations, melena, hematuria, hemoptysis, diaphoresis,  presyncope, syncope, orthopnea, and PND.   Home Medications    Prior to Admission medications   Medication Sig Start Date End Date Taking? Authorizing Provider  ELIQUIS 5 MG TABS tablet TAKE 1 TABLET BY MOUTH TWICE DAILY 04/21/19   [name] K, NP  lisinopril (PRINIVIL,ZESTRIL) 5 MG tablet TAKE 1 TABLET(5 MG) BY MOUTH DAILY. FOLLOW UP APPOINTMENT FOR FURTHER REFILLS Patient not taking: No sig reported 04/24/18   Lewayne Bunting, MD  metoprolol tartrate (LOPRESSOR) 25 MG tablet Take 0.5 tablets (12.5 mg total) by mouth 2 (  two) times daily. 04/24/18   Lewayne Bunting, MD  nitroGLYCERIN (NITROSTAT) 0.4 MG SL tablet Place 1 tablet (0.4 mg total) under the tongue every 5 (five) minutes as needed for chest pain. 10/09/18   [name] K, NP  oxyCODONE-acetaminophen (PERCOCET/ROXICET) 5-325 MG tablet Take 1 tablet by mouth every 6 (six) hours as needed for severe pain. 04/21/19   Emilie Rutter, PA-C    Family History    Family History  Problem Relation Age of Onset  . Arthritis Mother   . Emphysema Mother   . Arthritis Father   . COPD Father   . Heart disease Father   . Diabetes Paternal Aunt        paternal Aunts  . Heart disease Paternal Aunt   . Thyroid disease Neg Hx    She indicated that her mother is deceased. She indicated that her father is deceased. She indicated that her maternal grandmother is deceased. She indicated that her maternal grandfather is  deceased. She indicated that her paternal grandmother is deceased. She indicated that her paternal grandfather is deceased. She indicated that the status of her neg hx is unknown.  Social History    Social History   Socioeconomic History  . Marital status: Single    Spouse name: Not on file  . Number of children: 3  . Years of education: Not on file  . Highest education level: Not on file  Occupational History  . Occupation: retired  Engineer, production  . Financial resource strain: Not on file  . Food insecurity    Worry: Not on file    Inability: Not on file  . Transportation needs    Medical: Not on file    Non-medical: Not on file  Tobacco Use  . Smoking status: Former Smoker    Types: Cigarettes    Quit date: 05/19/2016    Years since quitting: 2.9  . Smokeless tobacco: Never Used  . Tobacco comment:    Substance and Sexual Activity  . Alcohol use: No    Alcohol/week: 0.0 standard drinks    Frequency: Never  . Drug use: No  . Sexual activity: Not on file  Lifestyle  . Physical activity    Days per week: Not on file    Minutes per session: Not on file  . Stress: Not on file  Relationships  . Social Musician on phone: Not on file    Gets together: Not on file    Attends religious service: Not on file    Active member of club or organization: Not on file    Attends meetings of clubs or organizations: Not on file    Relationship status: Not on file  . Intimate partner violence    Fear of current or ex partner: Not on file    Emotionally abused: Not on file    Physically abused: Not on file    Forced sexual activity: Not on file  Other Topics Concern  . Not on file  Social History Narrative   Lives alone.     Review of Systems    General:  No chills, fever, night sweats or weight changes.  Cardiovascular:  No chest pain, dyspnea on exertion, edema, orthopnea, palpitations, paroxysmal nocturnal dyspnea. Dermatological: No rash, lesions/masses  Respiratory: No cough, dyspnea Urologic: No hematuria, dysuria Abdominal:   No nausea, vomiting, diarrhea, bright red blood per rectum, melena, or hematemesis Neurologic:  No visual changes, wkns, changes in mental status. All other  systems reviewed and are otherwise negative except as noted above.  Physical Exam    VS:  BP (!) 142/64   Pulse 71   Ht 5' 5.5" (1.664 m)   Wt 107 lb (48.5 kg)   SpO2 94%   BMI 17.53 kg/m  , BMI Body mass index is 17.53 kg/m. GEN: Well nourished, well developed, in no acute distress. HEENT: normal. Neck: Supple, no JVD, carotid bruits, or masses. Cardiac: RRR, no murmurs, rubs, or gallops. No clubbing, cyanosis, edema.  Radials/DP/PT 2+ and equal bilaterally.  Respiratory:  Respirations regular and unlabored, clear to auscultation bilaterally. GI: Soft, nontender, nondistended, BS + x 4. MS: no deformity or atrophy. Skin: warm and dry, no rash. Neuro:  Strength and sensation are intact. Psych: Normal affect.  Accessory Clinical Findings    ECG personally reviewed by me today-  sinus rhythm with premature atrial complexes 62 bpm - No acute changes  EKG 10/21/2018 Sinus rhythm 83 bpm  Echocardiogram 07/17/2016 Result status: Final result   Left ventricle: Normal cavity size. Concentric hypertrophy of mild severity. LV systolic function is normal with an EF of 60-65%. There are no obvious wall motion abnormalities. No thrombus present. No mass present.  Aortic valve: No AV vegetation.  Mitral valve: No leaflet thickening and calcification present. Trace regurgitation.  Right ventricle: Normal cavity size, wall thickness and ejection fraction.  Tricuspid valve: Trace regurgitation. The tricuspid valve regurgitation jet is central.  Pulmonic valve: Trace regurgitation.   Cardiac catheterization 07/14/2016  Unchanged hazy lesions along proximal margin of proximal LCx stent protruding into the LMCA. This results in 40-50% distal LMCA and 70-80%  ostial LAD stenoses.  Intravascular ultrasound of LAD and LMCA demonstrates focal stenosis involving distal LMCA and ostial LAD with MLA of 4.5 mm^2. The plaque morphology appears most consistent with fibrosis and calcification rather than thrombus.   Recommendations: 1. Return to 4N for medical management.  Will discontinue tirofiban and initiate heparin infusion, given ACS and history of atrial fibrillation, 4 hours after TR band deflation. 2. Cardiac surgery consultation to discuss surgical revascularization.  LMCA to LAD PCI could be performed if the patient is felt to be inappropriate for CABG, though given existing LCx stent protruding into LMCA, though would be technically challenging and potentially high risk.  Assessment & Plan    Coronary artery disease- s/p CABG procedure on 07/17/2016 with LIMA to LAD, SVG to OM, and SVG to distal RCA.  No chest pain today Nitroglycerin 0.4 mg sublingual as needed Heart healthy low-sodium diet Increase physical activity as tolerated  Carotid artery stenosis-carotid Dopplers on November 2018 showed 1 to 39% right and 40 to 59% left stenosis. Recommend follow-up carotid ultrasound  Essential hypertension-BP today 142/64.  Suspect that blood pressure is elevated somewhat in presence of her left leg pain.  She is taking Percocet 5-325 every 6 hours as needed for pain. Increase lisinopril 10 mg tablet daily Continue metoprolol 12.5 mg tablet twice daily Heart healthy low-sodium diet Increase physical activity as tolerated  Paroxysmal atrial fibrillation-EKG today sinus rhythm with premature atrial complexes 62 bpm Continue metoprolol 12.5 mg twice daily Heart healthy low-sodium diet Increase physical activity as tolerated   Preop cardiac evaluation-at this time she is unable to perform 4 METS of activity to assess her cardiac health. Chart reviewed as part of pre-operative protocol coverage. Because of Onisha L Nagele's past medical history, she  will require a Lexiscan Myoview stress test in order to better assess preoperative cardiovascular risk.  Her RCRI risk is class IV, with a 11% risk of a major cardiac event. Reviewed with Dr. Gilman Schmidt. Further cardiac risk evaluation is needed.    Primary Cardiologist: Kirk Ruths, MD  I will route this recommendation to the requesting party via Epic fax function and remove from pre-op pool.  Please call with questions.     Deberah Pelton, NP-C 05/07/2019, 4:29 PM

## 2019-05-07 NOTE — Patient Instructions (Signed)
Medication Instructions:  INCREASE Lisinopril 10mg  Take 1 tablet once a day  If you need a refill on your cardiac medications before your next appointment, please call your pharmacy.   Lab work: None  If you have labs (blood work) drawn today and your tests are completely normal, you will receive your results only by: [name] MyChart Message (if you have MyChart) OR . A paper copy in the mail If you have any lab test that is abnormal or we need to change your treatment, we will call you to review the results.  Testing/Procedures: Your physician has requested that you have a lexiscan myoview. For further information please visit [email or web address]. Please follow instruction sheet, as given.   Follow-Up: At [facility], you and your health needs are our priority.  As part of our continuing mission to provide you with exceptional heart care, we have created designated Provider Care Teams.  These Care Teams include your primary Cardiologist (physician) and Advanced Practice Providers (APPs -  Physician Assistants and Nurse Practitioners) who all work together to provide you with the care you need, when you need it. You will need a follow up appointment in [place]. You may see Kirk Ruths, MD or one of the following Advanced Practice Providers on your designated Care Team:   Kerin Ransom, PA-C Roby Lofts, [place] . Sande Rives, PA-C  Any Other Special Instructions Will Be Listed Below (If Applicable).

## 2019-05-07 NOTE — Progress Notes (Signed)
Subjective:    Patient ID: Mercedes [name], female    DOB: April 01, 1956, 63 y.o.   MRN: [id number]  HPI The patient is here for follow up.  She is exercising regularly.    PAD:  She is scheduled for a redo left bypass of her fem-pop graft this month.   PAfib, CAD, Hypertension: She is taking her medication daily. She is compliant with a low sodium diet.  She denies chest pain, palpitations, edema, shortness of breath and regular headaches. She does not monitor her blood pressure at home.    Diabetes: She is controlling her sugars with diet. She is compliant with a diabetic diet.  She denies numbness/tingling in her feet and foot lesions. She is up-to-date with an ophthalmology examination.     Medications and allergies reviewed with patient and updated if appropriate.  Patient Active Problem List   Diagnosis Date Noted  . Dyspnea 02/01/2019  . Weight loss, unintentional 02/01/2019  . Epigastric pain 02/01/2019  . 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 Hyperbilirubinemia 10/21/2018  . Acute pyelonephritis 10/20/2018  . Acute renal failure superimposed on stage 2 chronic kidney disease ([name]) 10/20/2018  . Sepsis ([place]) 10/20/2018  . Diabetes ([place]) 04/01/2017  . Hyperthyroidism 02/21/2017  . Low mean corpuscular volume (MCV) 09/30/2016  . Coronary artery disease involving native heart without angina pectoris   . Hx of CABG 07/17/2016  . Paroxysmal atrial fibrillation ([place]) 07/12/2016  . NSTEMI (non-ST elevated myocardial infarction) ([place]) 07/11/2016  . History of arterial bypass of lower extremity 12/30/2015  . Claudication of right lower extremity ([place]) 12/30/2015  . Phantom limb pain-Left great toe 04/29/2015  . Seizure ([place])   . Atrial flutter with rapid ventricular response vs. SVT 10/27/2014    Class: Acute  . Atherosclerosis of native artery of left lower extremity with rest pain ([place]) 12/18/2013  . PAD (peripheral artery disease) ([place]) 12/02/2013  . SLE (systemic lupus  erythematosus) ([place])   . CAD - S/P Takotsubo MI in 2000, CFX DES Sept 2014 05/25/2013  . Acute MI, inferior wall ([place]) 05/19/2013  . 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 Tachycardia-bradycardia ([place]) 03/09/2010  . PVD- s/p Lt [facility] April 2015 10/29/2009  . Dyslipidemia 08/26/2009  . Essential hypertension 08/26/2009  . CORONARY ATHEROSCLEROSIS NATIVE CORONARY ARTERY 08/26/2009  . Secondary cardiomyopathy ([place]) 08/26/2009  . Rheumatoid arthritis ([place]) 08/26/2009    Current Outpatient Medications on File Prior to Visit  Medication Sig Dispense Refill  . ELIQUIS 5 MG TABS tablet TAKE 1 TABLET BY MOUTH TWICE DAILY 180 tablet 3  . lisinopril (ZESTRIL) 10 MG tablet Take 1 tablet (10 mg total) by mouth daily. 90 tablet 2  . metoprolol tartrate (LOPRESSOR) 25 MG tablet Take 0.5 tablets (12.5 mg total) by mouth 2 (two) times daily. 180 tablet 3  . nitroGLYCERIN (NITROSTAT) 0.4 MG SL tablet Place 1 tablet (0.4 mg total) under the tongue every 5 (five) minutes as needed for chest pain. 25 tablet 3  . oxyCODONE-acetaminophen (PERCOCET/ROXICET) 5-325 MG tablet Take 1 tablet by mouth every 6 (six) hours as needed for severe pain. 30 tablet 0   No current facility-administered medications on file prior to visit.     Past Medical History:  Diagnosis Date  . Abnormal LFTs    a. in the past when on Lipitor  . Anemia    as a young woman  . Anxiety   . Arnold-Chiari malformation, type I ([place])    MRI brain 02/2010  . CAD (coronary  artery disease)    a. NSTEMI 07/2009: LHC - D2 40%, LAD irreg., EF 50% with apical AK (tako-tsubo CM);  b. Inf STEMI (04/2013): Tx Promus DES to CFX;  c. 08/2013 Lexi CL: No ischemia, dist ant/ap/inf-ap infarct, EF  d. 06/2016 NSTEMI with LM disease: s/p CABG on 07/17/2016 w/ LIMA-LAD, SVG-OM, and SVG-dRCA.  [name] DEPRESSION   . Dizziness    ? CVA 01/2010 - carotid dopplers with no ICA stenosis  . GERD (gastroesophageal reflux disease)   . HYPERLIPIDEMIA   . HYPERTENSION   .  MI (myocardial infarction) (HCC)   . PAD (peripheral artery disease) (HCC)    s/p L fem pop 11/2013 in setting of 1st toe osteo/gangrene  . Paroxysmal atrial fibrillation (HCC)    a. anticoagulated with Eliquis 10/2014  . 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 RA (rheumatoid arthritis) (HCC)    prior tx by Dr. Dareen Piano  . Sjogren's syndrome (HCC) 06/02/13   pt denies this (12/01/13)  . SLE (systemic lupus erythematosus) (HCC) dx 05/2013   follows with rheum Dareen Piano)  . Tachy-brady syndrome (HCC)    a. s/p STJ Leadless pacemaker 11-04-2014 Dr Johney Frame  . 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 ABDOMINAL AORTAGRAM N/A 11/24/2013   Procedure: ABDOMINAL AORTAGRAM WITH BILATERAL RUNOFF WITH POSSIBLE INTERVENTION;  Surgeon: Chuck Hint, MD;  Location: [facility] CATH LAB;  Service: Cardiovascular;  Laterality: N/A;  . ABDOMINAL AORTOGRAM W/LOWER EXTREMITY N/A 04/25/2019   Procedure: ABDOMINAL AORTOGRAM W/LOWER EXTREMITY;  Surgeon: Sherren Kerns, MD;  Location: [facility] INVASIVE CV LAB;  Service: Cardiovascular;  Laterality: N/A;  . AMPUTATION Left 12/02/2013   Procedure: LEFT 1ST TOE AMPUTATION;  Surgeon: Sherren Kerns, MD;  Location: [facility] OR;  Service: Vascular;  Laterality: Left;  . CARDIAC CATHETERIZATION N/A 07/12/2016   Procedure: Left Heart Cath and Coronary Angiography;  Surgeon: Lennette Bihari, MD;  Location: [facility] INVASIVE CV LAB;  Service: Cardiovascular;  Laterality: N/A;  . CARDIAC CATHETERIZATION N/A 07/14/2016   Procedure: Intravascular Ultrasound/IVUS;  Surgeon: Yvonne Kendall, MD;  Location: [facility] INVASIVE CV LAB;  Service: Cardiovascular;  Laterality: N/A;  . COLONOSCOPY    . CORONARY ANGIOPLASTY  04/2013  . CORONARY ARTERY BYPASS GRAFT N/A 07/17/2016   Procedure: CORONARY ARTERY BYPASS GRAFTING (CABG)x3 using right greater  saphenous vein harvested endoscopiclly and left internal mammary artery;  Surgeon: Kerin Perna, MD;  Location: [facility] OR;  Service: Open Heart Surgery;  Laterality: N/A;  . FEMORAL-POPLITEAL BYPASS GRAFT Left 12/02/2013   Procedure:  LEFT FEMORAL-BELOW KNEE POPLITEAL ARTERY BYPASS GRAFT;  Surgeon: Sherren Kerns, MD;  Location: [facility] OR;  Service: Vascular;  Laterality: Left;  . LEFT HEART CATHETERIZATION WITH CORONARY ANGIOGRAM N/A 05/19/2013   Procedure: LEFT HEART CATHETERIZATION WITH CORONARY ANGIOGRAM;  Surgeon: Micheline Chapman, MD;  Location: [facility] CATH LAB;  Service: Cardiovascular;  Laterality: N/A;  . LEFT HEART CATHETERIZATION WITH CORONARY ANGIOGRAM N/A 10/28/2014   Procedure: LEFT HEART CATHETERIZATION WITH CORONARY ANGIOGRAM;  Surgeon: Iran Ouch, MD;  Location: [facility] CATH LAB;  Service: Cardiovascular;  Laterality: N/A;  . PERCUTANEOUS CORONARY STENT INTERVENTION (PCI-S)  05/19/2013   Procedure: PERCUTANEOUS CORONARY STENT INTERVENTION (PCI-S);  Surgeon: Micheline Chapman, MD;  Location: [facility] CATH LAB;  Service: Cardiovascular;;  . PERMANENT PACEMAKER INSERTION N/A 11/04/2014   STJ Leadless pacemaker implanted  by Dr Johney Frame for tachy/brady  . TEE WITHOUT CARDIOVERSION N/A 07/17/2016   Procedure: TRANSESOPHAGEAL ECHOCARDIOGRAM (TEE);  Surgeon: Kerin Perna, MD;  Location: [facility] OR;  Service: Open Heart Surgery;  Laterality: N/A;  . TUBAL LIGATION    . VISCERAL ANGIOGRAPHY N/A 04/25/2019   Procedure: MESENTRIC  ANGIOGRAPHY;  Surgeon: Sherren Kerns, MD;  Location: [facility] INVASIVE CV LAB;  Service: Cardiovascular;  Laterality: N/A;    Social History   Socioeconomic History  . Marital status: Single    Spouse name: Not on file  . Number of children: 3  . Years of education: Not on file  . Highest education level: Not on file  Occupational History  . Occupation: retired  Engineer, production  . Financial resource strain: Not on file  . Food insecurity    Worry: Not on file    Inability: Not on  file  . Transportation needs    Medical: Not on file    Non-medical: Not on file  Tobacco Use  . Smoking status: Former Smoker    Types: Cigarettes    Quit date: 05/19/2016    Years since quitting: 2.9  . Smokeless tobacco: Never Used  . Tobacco comment:    Substance and Sexual Activity  . Alcohol use: No    Alcohol/week: 0.0 standard drinks    Frequency: Never  . Drug use: No  . Sexual activity: Not on file  Lifestyle  . Physical activity    Days per week: Not on file    Minutes per session: Not on file  . Stress: Not on file  Relationships  . Social Musician on phone: Not on file    Gets together: Not on file    Attends religious service: Not on file    Active member of club or organization: Not on file    Attends meetings of clubs or organizations: Not on file    Relationship status: Not on file  Other Topics Concern  . Not on file  Social History Narrative   Lives alone.    Family History  Problem Relation Age of Onset  . Arthritis Mother   . Emphysema Mother   . Arthritis Father   . COPD Father   . Heart disease Father   . Diabetes Paternal Aunt        paternal Aunts  . Heart disease Paternal Aunt   . Thyroid disease Neg Hx     Review of Systems     Objective:  There were no vitals filed for this visit. BP Readings from Last 3 Encounters:  05/07/19 (!) 142/64  05/01/19 135/68  04/25/19 (!) 153/77   Wt Readings from Last 3 Encounters:  05/07/19 107 lb (48.5 kg)  05/01/19 110 lb (49.9 kg)  04/25/19 110 lb (49.9 kg)   There is no height or weight on file to calculate BMI.   Physical Exam    Constitutional: Appears well-developed and well-nourished. No distress.  HENT:  Head: Normocephalic and atraumatic.  Neck: Neck supple. No tracheal deviation present. No thyromegaly present.  No cervical lymphadenopathy Cardiovascular: Normal rate, regular rhythm and normal heart sounds.   No murmur heard. No carotid bruit .  No edema  Pulmonary/Chest: Effort normal and breath sounds normal. No respiratory distress. No has no wheezes. No rales.  Skin: Skin is warm and dry. Not diaphoretic.  Psychiatric: Normal mood and affect. Behavior is normal.      Assessment & Plan:    See  Problem List for Assessment and Plan of chronic medical problems.   This encounter was created in error - please disregard.

## 2019-05-07 NOTE — Patient Instructions (Signed)
  Tests ordered today. Your results will be released to MyChart (or called to you) after review.  If any changes need to be made, you will be notified at that same time.  All other Health Maintenance issues reviewed.   All recommended immunizations and age-appropriate screenings are up-to-date or discussed.  No immunization administered today.   Medications reviewed and updated.  Changes include :     Your prescription(s) have been submitted to your pharmacy. Please take as directed and contact our office if you believe you are having problem(s) with the medication(s).  A referral was ordered for   Please followup in XX months   

## 2019-05-07 NOTE — Telephone Encounter (Signed)
Primary Cardiologist:Brian Stanford Breed, MD  Chart reviewed and patient seen in the office today as part of pre-operative protocol coverage. Because of Mercedes [name]'s past medical history and time since last visit, he/she will require  further testing to better assess preoperative cardiovascular risk.  Pre-op covering staff: - Please schedule appointment and call patient to inform them. - Please contact requesting surgeon's office via preferred method (i.e, phone, fax) to inform them of need for appointment prior to surgery.  If applicable, this message will also be routed to pharmacy pool and/or primary cardiologist for input on holding anticoagulant/antiplatelet agent as requested below so that this information is available at time of patient's appointment.   Deberah Pelton, NP  05/07/2019, 4:47 PM

## 2019-05-08 ENCOUNTER — Telehealth: Payer: Self-pay | Admitting: Vascular Surgery

## 2019-05-08 ENCOUNTER — Ambulatory Visit (INDEPENDENT_AMBULATORY_CARE_PROVIDER_SITE_OTHER)
Admission: RE | Admit: 2019-05-08 | Discharge: 2019-05-08 | Disposition: A | Discharge status: Home / Self Care | Payer: Medicare Other | Source: Ambulatory Visit | Attending: Family | Admitting: Family

## 2019-05-08 ENCOUNTER — Other Ambulatory Visit (HOSPITAL_COMMUNITY)
Admission: RE | Admit: 2019-05-08 | Discharge: 2019-05-08 | Disposition: A | Discharge status: Home / Self Care | Payer: Medicare Other | Source: Ambulatory Visit | Attending: Vascular Surgery | Admitting: Vascular Surgery

## 2019-05-08 ENCOUNTER — Encounter: Payer: Medicare Other | Admitting: Internal Medicine

## 2019-05-08 ENCOUNTER — Encounter: Payer: Self-pay | Admitting: Vascular Surgery

## 2019-05-08 ENCOUNTER — Ambulatory Visit (INDEPENDENT_AMBULATORY_CARE_PROVIDER_SITE_OTHER)
Admission: RE | Admit: 2019-05-08 | Discharge: 2019-05-08 | Disposition: A | Discharge status: Home / Self Care | Payer: Medicare Other | Source: Ambulatory Visit | Attending: Vascular Surgery | Admitting: Vascular Surgery

## 2019-05-08 ENCOUNTER — Other Ambulatory Visit: Payer: Self-pay

## 2019-05-08 ENCOUNTER — Telehealth (HOSPITAL_COMMUNITY): Payer: Self-pay | Admitting: *Deleted

## 2019-05-08 DIAGNOSIS — N189 Chronic kidney disease, unspecified: Secondary | ICD-10-CM | POA: Diagnosis not present

## 2019-05-08 DIAGNOSIS — M79673 Pain in unspecified foot: Secondary | ICD-10-CM | POA: Insufficient documentation

## 2019-05-08 DIAGNOSIS — I739 Peripheral vascular disease, unspecified: Secondary | ICD-10-CM | POA: Diagnosis not present

## 2019-05-08 DIAGNOSIS — M79672 Pain in left foot: Secondary | ICD-10-CM | POA: Diagnosis not present

## 2019-05-08 DIAGNOSIS — Z20828 Contact with and (suspected) exposure to other viral communicable diseases: Secondary | ICD-10-CM | POA: Insufficient documentation

## 2019-05-08 DIAGNOSIS — I2510 Atherosclerotic heart disease of native coronary artery without angina pectoris: Secondary | ICD-10-CM | POA: Insufficient documentation

## 2019-05-08 DIAGNOSIS — E785 Hyperlipidemia, unspecified: Secondary | ICD-10-CM | POA: Insufficient documentation

## 2019-05-08 DIAGNOSIS — Z01818 Encounter for other preprocedural examination: Secondary | ICD-10-CM | POA: Insufficient documentation

## 2019-05-08 DIAGNOSIS — T82868A Thrombosis of vascular prosthetic devices, implants and grafts, initial encounter: Secondary | ICD-10-CM | POA: Diagnosis not present

## 2019-05-08 DIAGNOSIS — Z87820 Personal history of traumatic brain injury: Secondary | ICD-10-CM | POA: Insufficient documentation

## 2019-05-08 NOTE — Telephone Encounter (Signed)
Patient given detailed instructions per Myocardial Perfusion Study Information Sheet for the test on 05/09/19 at 10:00. Patient notified to arrive 15 minutes early and that it is imperative to arrive on time for appointment to keep from having the test rescheduled.  If you need to cancel or reschedule your appointment, please call the office within 24 hours of your appointment. . Patient verbalized understanding.Mercedes [name]

## 2019-05-08 NOTE — Telephone Encounter (Signed)
Notified Leota Jacobsen, BSN RN-VVS pt will require further cardiac testing on 05-09-2019 will route message also

## 2019-05-08 NOTE — Telephone Encounter (Signed)
Patient's vein mapping reviewed today.  Cephalic vein is fairly small bilaterally.  The basilic vein may be of reasonable quality for conduit it is about 3 to 4 mm in diameter.  We will ultrasound this after the patient has been induced with anesthesia.  If it is of poor quality then most likely we will end up using cadaveric vein.  I also reviewed the patient's bilateral ABIs.  Left side was not measurable secondary to pain.  Right side was 0.33  Patient is scheduled for redo left femoral peroneal bypass on Monday, September 14.  Her stress test is scheduled for Friday, September 11.  Ruta Hinds, MD [facility] Office: [phone] Pager: [phone]

## 2019-05-08 NOTE — Anesthesia Preprocedure Evaluation (Addendum)
Anesthesia Evaluation  Patient identified by MRN, date of birth, ID band Patient awake    Reviewed: Allergy & Precautions, NPO status , Patient's Chart, lab work & pertinent test results  History of Anesthesia Complications Negative for: history of anesthetic complications  Airway Mallampati: II  TM Distance: >3 FB Neck ROM: Full    Dental  (+) Dental Advisory Given, Edentulous Upper, Partial Lower   Pulmonary former smoker,    Pulmonary exam normal        Cardiovascular hypertension, Pt. on medications and Pt. on home beta blockers + CAD, + Past MI, + Cardiac Stents and + Peripheral Vascular Disease  Normal cardiovascular exam+ dysrhythmias Atrial Fibrillation + pacemaker    Hx Takotsubo cardiomyopathy 2010  '17 TEE - Concentric hypertrophy of mild severity. EF of 60-65%. Trace MR, TR, and PR.  Per note "Preop cardiac evaluation-at this time she is unable to perform 4 METS of activity to assess her cardiac health. Chart reviewed as part of pre-operative protocol coverage. Because of Mercedes [name]'s past medical history, she will require a Lexiscan Myoview stress test in order to better assess preoperative cardiovascular risk. Her RCRI riskis class IV,with a 11% risk of a major cardiac event. Reviewed with Dr. Gilman [name]. Further cardiac risk evaluation is needed." Pt also follows with EP Cardiology for tachy/brady syndrome s/p St Jude leadless PPM 2016. She was last seen Feb 2020 and at that time device was at RRT and not responding appropriately to magnet. It was decided to pursue watchful waiting as pt was doing well overall. I reached out to EP cardiology NP Mercedes [name] and she said the pt did not need any further EP eval prior to surgery and that nothing special needed to be done perioperatively as the pt has elected to not replace the device due to absence of symptoms.  Pt presented to ED 05/09/19 and admitted with worsening foot  pain. Outpatient stress test not done as it was scheduled to be done on day of admission.      Neuro/Psych PSYCHIATRIC DISORDERS Anxiety Depression  Arnold-Chiari Type I Denies hx seizures  TIA   GI/Hepatic Neg liver ROS, GERD  Controlled and Medicated,  Endo/Other   Denies hx of DM   Renal/GU negative Renal ROS     Musculoskeletal  (+) Arthritis , Rheumatoid disorders,    Abdominal   Peds  Hematology negative hematology ROS (+)   Anesthesia Other Findings SLE Sjogren's syndrome    Reproductive/Obstetrics                           Anesthesia Physical Anesthesia Plan  ASA: IV  Anesthesia Plan: General   Post-op Pain Management:    Induction: Intravenous  PONV Risk Score and Plan: 4 or greater and Treatment may vary due to age or medical condition, Ondansetron and Dexamethasone  Airway Management Planned: Oral ETT  Additional Equipment: Arterial line  Intra-op Plan:   Post-operative Plan: Extubation in OR  Informed Consent: I have reviewed the patients History and Physical, chart, labs and discussed the procedure including the risks, benefits and alternatives for the proposed anesthesia with the patient or authorized representative who has indicated his/her understanding and acceptance.     Dental advisory given  Plan Discussed with: CRNA and Anesthesiologist  Anesthesia Plan Comments:    Anesthesia Quick Evaluation

## 2019-05-09 ENCOUNTER — Telehealth: Payer: Self-pay | Admitting: Cardiology

## 2019-05-09 ENCOUNTER — Inpatient Hospital Stay (HOSPITAL_COMMUNITY)
Admission: EM | Admit: 2019-05-09 | Discharge: 2019-05-16 | DRG: 270 | Disposition: A | Discharge status: Home Health | Payer: Medicare Other | Attending: Vascular Surgery | Admitting: Vascular Surgery

## 2019-05-09 ENCOUNTER — Emergency Department (HOSPITAL_COMMUNITY)
Admission: EM | Admit: 2019-05-09 | Discharge: 2019-05-09 | Discharge status: Against Medical Advice | Payer: Medicare Other | Source: Home / Self Care

## 2019-05-09 ENCOUNTER — Telehealth: Payer: Self-pay | Admitting: *Deleted

## 2019-05-09 ENCOUNTER — Encounter (HOSPITAL_COMMUNITY): Payer: Medicare Other

## 2019-05-09 DIAGNOSIS — Z79899 Other long term (current) drug therapy: Secondary | ICD-10-CM

## 2019-05-09 DIAGNOSIS — Z951 Presence of aortocoronary bypass graft: Secondary | ICD-10-CM

## 2019-05-09 DIAGNOSIS — M3500 Sicca syndrome, unspecified: Secondary | ICD-10-CM | POA: Diagnosis present

## 2019-05-09 DIAGNOSIS — Z8261 Family history of arthritis: Secondary | ICD-10-CM | POA: Diagnosis not present

## 2019-05-09 DIAGNOSIS — Z950 Presence of cardiac pacemaker: Secondary | ICD-10-CM

## 2019-05-09 DIAGNOSIS — Z79891 Long term (current) use of opiate analgesic: Secondary | ICD-10-CM | POA: Diagnosis not present

## 2019-05-09 DIAGNOSIS — M329 Systemic lupus erythematosus, unspecified: Secondary | ICD-10-CM | POA: Diagnosis present

## 2019-05-09 DIAGNOSIS — I4891 Unspecified atrial fibrillation: Secondary | ICD-10-CM | POA: Diagnosis not present

## 2019-05-09 DIAGNOSIS — E785 Hyperlipidemia, unspecified: Secondary | ICD-10-CM | POA: Diagnosis present

## 2019-05-09 DIAGNOSIS — Z833 Family history of diabetes mellitus: Secondary | ICD-10-CM

## 2019-05-09 DIAGNOSIS — I252 Old myocardial infarction: Secondary | ICD-10-CM

## 2019-05-09 DIAGNOSIS — I10 Essential (primary) hypertension: Secondary | ICD-10-CM | POA: Diagnosis present

## 2019-05-09 DIAGNOSIS — Z89412 Acquired absence of left great toe: Secondary | ICD-10-CM

## 2019-05-09 DIAGNOSIS — T82868A Thrombosis of vascular prosthetic devices, implants and grafts, initial encounter: Secondary | ICD-10-CM | POA: Diagnosis present

## 2019-05-09 DIAGNOSIS — Z825 Family history of asthma and other chronic lower respiratory diseases: Secondary | ICD-10-CM

## 2019-05-09 DIAGNOSIS — Y712 Prosthetic and other implants, materials and accessory cardiovascular devices associated with adverse incidents: Secondary | ICD-10-CM | POA: Diagnosis present

## 2019-05-09 DIAGNOSIS — Z20828 Contact with and (suspected) exposure to other viral communicable diseases: Secondary | ICD-10-CM | POA: Diagnosis present

## 2019-05-09 DIAGNOSIS — Z7901 Long term (current) use of anticoagulants: Secondary | ICD-10-CM

## 2019-05-09 DIAGNOSIS — M79672 Pain in left foot: Secondary | ICD-10-CM | POA: Diagnosis present

## 2019-05-09 DIAGNOSIS — Z888 Allergy status to other drugs, medicaments and biological substances status: Secondary | ICD-10-CM

## 2019-05-09 DIAGNOSIS — M069 Rheumatoid arthritis, unspecified: Secondary | ICD-10-CM | POA: Diagnosis present

## 2019-05-09 DIAGNOSIS — I70222 Atherosclerosis of native arteries of extremities with rest pain, left leg: Secondary | ICD-10-CM

## 2019-05-09 DIAGNOSIS — Z8249 Family history of ischemic heart disease and other diseases of the circulatory system: Secondary | ICD-10-CM

## 2019-05-09 DIAGNOSIS — I2510 Atherosclerotic heart disease of native coronary artery without angina pectoris: Secondary | ICD-10-CM | POA: Diagnosis present

## 2019-05-09 DIAGNOSIS — Z955 Presence of coronary angioplasty implant and graft: Secondary | ICD-10-CM | POA: Diagnosis not present

## 2019-05-09 DIAGNOSIS — Y832 Surgical operation with anastomosis, bypass or graft as the cause of abnormal reaction of the patient, or of later complication, without mention of misadventure at the time of the procedure: Secondary | ICD-10-CM | POA: Diagnosis present

## 2019-05-09 DIAGNOSIS — Z419 Encounter for procedure for purposes other than remedying health state, unspecified: Secondary | ICD-10-CM

## 2019-05-09 DIAGNOSIS — I739 Peripheral vascular disease, unspecified: Secondary | ICD-10-CM | POA: Diagnosis present

## 2019-05-09 DIAGNOSIS — I480 Paroxysmal atrial fibrillation: Secondary | ICD-10-CM | POA: Diagnosis present

## 2019-05-09 DIAGNOSIS — I998 Other disorder of circulatory system: Secondary | ICD-10-CM | POA: Diagnosis present

## 2019-05-09 DIAGNOSIS — I70229 Atherosclerosis of native arteries of extremities with rest pain, unspecified extremity: Secondary | ICD-10-CM | POA: Diagnosis present

## 2019-05-09 DIAGNOSIS — Z87891 Personal history of nicotine dependence: Secondary | ICD-10-CM

## 2019-05-09 DIAGNOSIS — G935 Compression of brain: Secondary | ICD-10-CM | POA: Diagnosis present

## 2019-05-09 DIAGNOSIS — Z91040 Latex allergy status: Secondary | ICD-10-CM

## 2019-05-09 DIAGNOSIS — E43 Unspecified severe protein-calorie malnutrition: Secondary | ICD-10-CM | POA: Insufficient documentation

## 2019-05-09 DIAGNOSIS — Z5321 Procedure and treatment not carried out due to patient leaving prior to being seen by health care provider: Secondary | ICD-10-CM | POA: Insufficient documentation

## 2019-05-09 LAB — CBC WITH DIFFERENTIAL/PLATELET
Abs Immature Granulocytes: 0.01 10*3/uL (ref 0.00–0.07)
Abs Immature Granulocytes: 0.01 10*3/uL (ref 0.00–0.07)
Basophils Absolute: 0.1 10*3/uL (ref 0.0–0.1)
Basophils Absolute: 0.1 10*3/uL (ref 0.0–0.1)
Basophils Relative: 1 %
Basophils Relative: 2 %
Eosinophils Absolute: 0.2 10*3/uL (ref 0.0–0.5)
Eosinophils Absolute: 0.1 10*3/uL (ref 0.0–0.5)
Eosinophils Relative: 3 %
Eosinophils Relative: 2 %
HCT: 41 % (ref 36.0–46.0)
HCT: 42.5 % (ref 36.0–46.0)
Hemoglobin: 12.9 g/dL (ref 12.0–15.0)
Hemoglobin: 13.3 g/dL (ref 12.0–15.0)
Immature Granulocytes: 0 %
Immature Granulocytes: 0 %
Lymphocytes Relative: 37 %
Lymphocytes Relative: 39 %
Lymphs Abs: 2.5 10*3/uL (ref 0.7–4.0)
Lymphs Abs: 2.1 10*3/uL (ref 0.7–4.0)
MCH: 24.7 pg — ABNORMAL LOW (ref 26.0–34.0)
MCH: 24.8 pg — ABNORMAL LOW (ref 26.0–34.0)
MCHC: 31.5 g/dL (ref 30.0–36.0)
MCHC: 31.3 g/dL (ref 30.0–36.0)
MCV: 78.5 fL — ABNORMAL LOW (ref 80.0–100.0)
MCV: 79.1 fL — ABNORMAL LOW (ref 80.0–100.0)
Monocytes Absolute: 0.5 10*3/uL (ref 0.1–1.0)
Monocytes Absolute: 0.5 10*3/uL (ref 0.1–1.0)
Monocytes Relative: 8 %
Monocytes Relative: 10 %
Neutro Abs: 3.4 10*3/uL (ref 1.7–7.7)
Neutro Abs: 2.5 10*3/uL (ref 1.7–7.7)
Neutrophils Relative %: 51 %
Neutrophils Relative %: 47 %
Platelets: 340 10*3/uL (ref 150–400)
Platelets: 349 10*3/uL (ref 150–400)
RBC: 5.22 MIL/uL — ABNORMAL HIGH (ref 3.87–5.11)
RBC: 5.37 MIL/uL — ABNORMAL HIGH (ref 3.87–5.11)
RDW: 15.9 % — ABNORMAL HIGH (ref 11.5–15.5)
RDW: 15.9 % — ABNORMAL HIGH (ref 11.5–15.5)
WBC: 6.7 10*3/uL (ref 4.0–10.5)
WBC: 5.3 10*3/uL (ref 4.0–10.5)
nRBC: 0 % (ref 0.0–0.2)
nRBC: 0 % (ref 0.0–0.2)

## 2019-05-09 LAB — URINALYSIS, ROUTINE W REFLEX MICROSCOPIC
Bacteria, UA: NONE SEEN
Bilirubin Urine: NEGATIVE
Glucose, UA: NEGATIVE mg/dL
Hgb urine dipstick: NEGATIVE
Ketones, ur: 5 mg/dL — AB
Nitrite: NEGATIVE
Protein, ur: NEGATIVE mg/dL
Specific Gravity, Urine: 1.019 (ref 1.005–1.030)
pH: 5 (ref 5.0–8.0)

## 2019-05-09 LAB — COMPREHENSIVE METABOLIC PANEL
ALT: 11 U/L (ref 0–44)
AST: 18 U/L (ref 15–41)
Albumin: 3.5 g/dL (ref 3.5–5.0)
Alkaline Phosphatase: 82 U/L (ref 38–126)
Anion gap: 9 (ref 5–15)
BUN: 16 mg/dL (ref 8–23)
CO2: 23 mmol/L (ref 22–32)
Calcium: 9.7 mg/dL (ref 8.9–10.3)
Chloride: 104 mmol/L (ref 98–111)
Creatinine, Ser: 0.97 mg/dL (ref 0.44–1.00)
GFR calc Af Amer: >60 mL/min (ref >60)
GFR calc non Af Amer: >60 mL/min (ref >60)
Glucose, Bld: 89 mg/dL (ref 70–99)
Potassium: 4.3 mmol/L (ref 3.5–5.1)
Sodium: 136 mmol/L (ref 135–145)
Total Bilirubin: 0.6 mg/dL (ref 0.3–1.2)
Total Protein: 8.3 g/dL — ABNORMAL HIGH (ref 6.5–8.1)

## 2019-05-09 LAB — PROTIME-INR
INR: 1 (ref 0.8–1.2)
INR: 1.1 (ref 0.8–1.2)
Prothrombin Time: 13.1 seconds (ref 11.4–15.2)
Prothrombin Time: 14 seconds (ref 11.4–15.2)

## 2019-05-09 LAB — BASIC METABOLIC PANEL
Anion gap: 9 (ref 5–15)
Anion gap: 9 (ref 5–15)
BUN: 19 mg/dL (ref 8–23)
BUN: 16 mg/dL (ref 8–23)
CO2: 22 mmol/L (ref 22–32)
CO2: 24 mmol/L (ref 22–32)
Calcium: 9.3 mg/dL (ref 8.9–10.3)
Calcium: 9.7 mg/dL (ref 8.9–10.3)
Chloride: 104 mmol/L (ref 98–111)
Chloride: 102 mmol/L (ref 98–111)
Creatinine, Ser: 1.08 mg/dL — ABNORMAL HIGH (ref 0.44–1.00)
Creatinine, Ser: 1.03 mg/dL — ABNORMAL HIGH (ref 0.44–1.00)
GFR calc Af Amer: >60 mL/min (ref >60)
GFR calc Af Amer: >60 mL/min (ref >60)
GFR calc non Af Amer: 54 mL/min — ABNORMAL LOW (ref >60)
GFR calc non Af Amer: 57 mL/min — ABNORMAL LOW (ref >60)
Glucose, Bld: 109 mg/dL — ABNORMAL HIGH (ref 70–99)
Glucose, Bld: 107 mg/dL — ABNORMAL HIGH (ref 70–99)
Potassium: 4.2 mmol/L (ref 3.5–5.1)
Potassium: 4.1 mmol/L (ref 3.5–5.1)
Sodium: 135 mmol/L (ref 135–145)
Sodium: 135 mmol/L (ref 135–145)

## 2019-05-09 LAB — CBC
HCT: 43 % (ref 36.0–46.0)
Hemoglobin: 13.5 g/dL (ref 12.0–15.0)
MCH: 25 pg — ABNORMAL LOW (ref 26.0–34.0)
MCHC: 31.4 g/dL (ref 30.0–36.0)
MCV: 79.5 fL — ABNORMAL LOW (ref 80.0–100.0)
Platelets: 308 10*3/uL (ref 150–400)
RBC: 5.41 MIL/uL — ABNORMAL HIGH (ref 3.87–5.11)
RDW: 15.9 % — ABNORMAL HIGH (ref 11.5–15.5)
WBC: 6.1 10*3/uL (ref 4.0–10.5)
nRBC: 0 % (ref 0.0–0.2)

## 2019-05-09 LAB — HEPARIN LEVEL (UNFRACTIONATED)
Heparin Unfractionated: <0.1 IU/mL — ABNORMAL LOW (ref 0.30–0.70)
Heparin Unfractionated: 0.39 IU/mL (ref 0.30–0.70)

## 2019-05-09 LAB — NOVEL CORONAVIRUS, NAA (HOSP ORDER, SEND-OUT TO REF LAB; TAT 18-24 HRS): SARS-CoV-2, NAA: NOT DETECTED

## 2019-05-09 LAB — SARS CORONAVIRUS 2 (TAT 6-24 HRS): SARS Coronavirus 2: NEGATIVE

## 2019-05-09 LAB — APTT: aPTT: 28 seconds (ref 24–36)

## 2019-05-09 MED ORDER — SODIUM CHLORIDE 0.9 % IV SOLN
INTRAVENOUS | Status: DC
  Administered 2019-05-09 – 2019-05-15 (×4): via INTRAVENOUS

## 2019-05-09 MED ORDER — PANTOPRAZOLE SODIUM 40 MG PO TBEC: 40.0000 mg | DELAYED_RELEASE_TABLET | Freq: Every day | ORAL | Status: DC

## 2019-05-09 MED ORDER — HYDRALAZINE HCL 20 MG/ML IJ SOLN: 5.0000 mg | INTRAMUSCULAR | Status: DC | PRN

## 2019-05-09 MED ORDER — LISINOPRIL 10 MG PO TABS: 10.0000 mg | ORAL_TABLET | Freq: Every day | ORAL | Status: DC

## 2019-05-09 MED ORDER — ACETAMINOPHEN 325 MG PO TABS
325.0000 mg | ORAL_TABLET | ORAL | Status: DC | PRN
  Administered 2019-05-14 (×3): 650 mg via ORAL
  Filled 2019-05-09 (×3): qty 2

## 2019-05-09 MED ORDER — ACETAMINOPHEN 325 MG RE SUPP
325.0000 mg | RECTAL | Status: DC | PRN
  Filled 2019-05-09: qty 2

## 2019-05-09 MED ORDER — METOPROLOL TARTRATE 12.5 MG HALF TABLET
12.5000 mg | ORAL_TABLET | Freq: Two times a day (BID) | ORAL | Status: DC
  Administered 2019-05-09 – 2019-05-16 (×13): 12.5 mg via ORAL
  Filled 2019-05-09 (×13): qty 1

## 2019-05-09 MED ORDER — LISINOPRIL 10 MG PO TABS
10.0000 mg | ORAL_TABLET | Freq: Every day | ORAL | Status: DC
  Administered 2019-05-09 – 2019-05-10 (×2): 10 mg via ORAL
  Filled 2019-05-09 (×3): qty 1

## 2019-05-09 MED ORDER — MORPHINE SULFATE (PF) 4 MG/ML IV SOLN
4.0000 mg | Freq: Once | INTRAVENOUS | Status: AC
  Administered 2019-05-09: 20:00:00 4 mg via INTRAVENOUS
  Filled 2019-05-09: qty 1

## 2019-05-09 MED ORDER — PANTOPRAZOLE SODIUM 40 MG PO TBEC
40.0000 mg | DELAYED_RELEASE_TABLET | Freq: Every day | ORAL | Status: DC
  Administered 2019-05-09 – 2019-05-16 (×7): 40 mg via ORAL
  Filled 2019-05-09 (×7): qty 1

## 2019-05-09 MED ORDER — PHENOL 1.4 % MT LIQD
1.0000 | OROMUCOSAL | Status: DC | PRN
  Filled 2019-05-09: qty 177

## 2019-05-09 MED ORDER — NITROGLYCERIN 0.4 MG SL SUBL: 0.4000 mg | SUBLINGUAL_TABLET | SUBLINGUAL | Status: DC | PRN

## 2019-05-09 MED ORDER — HYDROMORPHONE HCL 1 MG/ML IJ SOLN
1.0000 mg | Freq: Once | INTRAMUSCULAR | Status: AC
  Administered 2019-05-09: 13:00:00 1 mg via INTRAVENOUS
  Filled 2019-05-09: qty 1

## 2019-05-09 MED ORDER — HYDROMORPHONE HCL 1 MG/ML IJ SOLN
1.0000 mg | Freq: Once | INTRAMUSCULAR | Status: AC
  Administered 2019-05-09: 1 mg via INTRAVENOUS
  Filled 2019-05-09: qty 1

## 2019-05-09 MED ORDER — MORPHINE SULFATE (PF) 2 MG/ML IV SOLN
2.0000 mg | INTRAVENOUS | Status: DC | PRN
  Administered 2019-05-10 (×3): 2 mg via INTRAVENOUS
  Administered 2019-05-11: 4 mg via INTRAVENOUS
  Administered 2019-05-11 – 2019-05-12 (×4): 2 mg via INTRAVENOUS
  Filled 2019-05-09: qty 1
  Filled 2019-05-09: qty 2
  Filled 2019-05-09 (×5): qty 1
  Filled 2019-05-09: qty 2
  Filled 2019-05-09: qty 1
  Filled 2019-05-09: qty 2

## 2019-05-09 MED ORDER — OXYCODONE-ACETAMINOPHEN 5-325 MG PO TABS
1.0000 | ORAL_TABLET | ORAL | Status: DC | PRN
  Administered 2019-05-09 – 2019-05-13 (×14): 2 via ORAL
  Administered 2019-05-15 – 2019-05-16 (×5): 1 via ORAL
  Filled 2019-05-09 (×2): qty 2
  Filled 2019-05-09: qty 1
  Filled 2019-05-09 (×2): qty 2
  Filled 2019-05-09: qty 1
  Filled 2019-05-09 (×2): qty 2
  Filled 2019-05-09: qty 1
  Filled 2019-05-09 (×3): qty 2
  Filled 2019-05-09: qty 1
  Filled 2019-05-09 (×7): qty 2
  Filled 2019-05-09: qty 1

## 2019-05-09 MED ORDER — GUAIFENESIN-DM 100-10 MG/5ML PO SYRP: 15.0000 mL | ORAL_SOLUTION | ORAL | Status: DC | PRN

## 2019-05-09 MED ORDER — ALUM & MAG HYDROXIDE-SIMETH 200-200-20 MG/5ML PO SUSP: 15.0000 mL | ORAL | Status: DC | PRN

## 2019-05-09 MED ORDER — ONDANSETRON HCL 4 MG/2ML IJ SOLN: 4.0000 mg | Freq: Four times a day (QID) | INTRAMUSCULAR | Status: DC | PRN

## 2019-05-09 MED ORDER — LABETALOL HCL 5 MG/ML IV SOLN
10.0000 mg | INTRAVENOUS | Status: DC | PRN
  Administered 2019-05-12 (×2): 10 mg via INTRAVENOUS

## 2019-05-09 MED ORDER — FENTANYL CITRATE (PF) 100 MCG/2ML IJ SOLN
50.0000 ug | Freq: Once | INTRAMUSCULAR | Status: AC
  Administered 2019-05-09: 50 ug via INTRAVENOUS
  Filled 2019-05-09: qty 2

## 2019-05-09 MED ORDER — POTASSIUM CHLORIDE CRYS ER 20 MEQ PO TBCR
20.0000 meq | EXTENDED_RELEASE_TABLET | Freq: Once | ORAL | Status: DC
  Filled 2019-05-09: qty 1

## 2019-05-09 MED ORDER — HEPARIN (PORCINE) 25000 UT/250ML-% IV SOLN
650.0000 [IU]/h | INTRAVENOUS | Status: AC
  Administered 2019-05-09 – 2019-05-10 (×2): 800 [IU]/h via INTRAVENOUS
  Filled 2019-05-09 (×2): qty 250

## 2019-05-09 MED ORDER — METOPROLOL TARTRATE 5 MG/5ML IV SOLN: 2.0000 mg | INTRAVENOUS | Status: DC | PRN

## 2019-05-09 NOTE — Progress Notes (Addendum)
ANTICOAGULATION CONSULT NOTE  Pharmacy Consult for heparin Indication: ischemic foot  Heparin Dosing Weight: 48.5 kg  Labs: Recent Labs    05/09/19 0121  HGB 12.9  HCT 41.0  PLT 340  LABPROT 13.1  INR 1.0  CREATININE 1.08*    Assessment: 44 yof on apixaban PTA for PAF presenting with foot pain. Pharmacy consulted to dose heparin for ischemic foot. Patient was originally scheduled for redo L femoral peroneal bypass on 9/14 with Vascular Surgery. Last dose of apixaban was "a couple days ago" PTA per patient, does not recall exact last dose. CBC wnl. No active bleed issues documented.  Goal of Therapy:  Heparin level 0.3-0.7 units/ml aPTT 66-102 seconds Monitor platelets by anticoagulation protocol: Yes   Plan:  Baseline aPTT/heparin level No bolus with unclear last dose of apixaban Start heparin at 800 units/h 6h aPTT Daily heparin level/aPTT/CBC Monitor s/sx bleeding F/u Vascular plans  ADDENDUM:  Baseline heparin level undetectable. Will d/c aPTT monitoring and use only heparin levels.   Elicia Lamp, PharmD, BCPS Clinical Pharmacist 05/09/2019 11:21 AM

## 2019-05-09 NOTE — ED Notes (Signed)
Messaged Joanette Gula PA and called previous RN to gather information.

## 2019-05-09 NOTE — Telephone Encounter (Signed)
Spoke with Mercedes [name] and informed her that patient has a lexiscan scheduled for today and were hoping to have the test reviewed so patient can have her surgery.

## 2019-05-09 NOTE — Progress Notes (Signed)
ANTICOAGULATION CONSULT NOTE  Pharmacy Consult for heparin Indication: ischemic foot  Heparin Dosing Weight: 48.5 kg  Labs: Recent Labs    05/09/19 0121 05/09/19 1121 05/09/19 1944 05/09/19 2135  HGB 12.9 13.3 13.5  --   HCT 41.0 42.5 43.0  --   PLT 340 349 308  --   APTT  --  28  --   --   LABPROT 13.1  --   --  14.0  INR 1.0  --   --  1.1  HEPARINUNFRC  --  <0.10*  --  0.39  CREATININE 1.08* 1.03* 0.97  --     Assessment: 32 yof on apixaban PTA for PAF presenting with foot pain. Pharmacy consulted to dose heparin for ischemic foot. Patient was originally scheduled for redo L femoral peroneal bypass on 9/14 with Vascular Surgery. Last dose of apixaban was "a couple days ago" PTA per patient, does not recall exact last dose. CBC wnl.   Baseline heparin level was undetectable. Therefore,  d/c'd aPTT monitoring and using only heparin levels.  Heparin level - within goal at 0.39. No active bleed issues documented.  Goal of Therapy:  Heparin level 0.3 - 0.7 Monitor platelets by anticoagulation protocol: Yes   Plan:  Continue heparin at 800 units/h Daily heparin level/aPTT/CBC Monitor s/sx bleeding F/u Vascular plans  Alanda Slim, PharmD, [facility] Clinical Pharmacist Please see AMION for all Pharmacists' Contact Phone Numbers 05/09/2019, 10:32 PM

## 2019-05-09 NOTE — H&P (Addendum)
Hospital Consult    Reason for Consult:  Ischemic left lower extremity Requesting Physician:  ED MRN #:  [id number]  History of Present Illness: This is a 63 y.o. female with past medical history significant for hypertension, hyperlipidemia, CAD, and PAD with history of left femoral to below the knee popliteal bypass with vein and great toe amputation by Dr. Darrick Penna in 2015.  She had been lost to follow-up however recently returned office with 2 to 4 weeks of rest pain in left foot.  She was scheduled for arteriogram with Dr. Darrick Penna which demonstrated an occluded left femoral, deep femoral, and bypass graft with reconstitution of small peroneal artery.  She was scheduled for left femoral endarterectomy and femoral to peroneal bypass by Dr. Darrick Penna on 05/12/2019.  Patient states the pain in her foot has worsened over the last 24 hours and thus she reported to the emergency department.  She also complains of tissue changes of left foot toe tips as well as left second toe tip.  She is on Eliquis for atrial fibrillation however this was stopped in preparation for angiography and has not been restarted.    Past Medical History:  Diagnosis Date  . Abnormal LFTs    a. in the past when on Lipitor  . Anemia    as a young woman  . Anxiety   . Arnold-Chiari malformation, type I (HCC)    MRI brain 02/2010  . CAD (coronary artery disease)    a. NSTEMI 07/2009: LHC - D2 40%, LAD irreg., EF 50% with apical AK (tako-tsubo CM);  b. Inf STEMI (04/2013): Tx Promus DES to CFX;  c. 08/2013 Lexi CL: No ischemia, dist ant/ap/inf-ap infarct, EF  d. 06/2016 NSTEMI with LM disease: s/p CABG on 07/17/2016 w/ LIMA-LAD, SVG-OM, and SVG-dRCA.  [name] DEPRESSION   . Dizziness    ? CVA 01/2010 - carotid dopplers with no ICA stenosis  . GERD (gastroesophageal reflux disease)   . HYPERLIPIDEMIA   . HYPERTENSION   . MI (myocardial infarction) (HCC)   . PAD (peripheral artery disease) (HCC)    s/p L fem pop 11/2013 in setting of 1st  toe osteo/gangrene  . Paroxysmal atrial fibrillation (HCC)    a. anticoagulated with Eliquis 10/2014  . 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 RA (rheumatoid arthritis) (HCC)    prior tx by Dr. Dareen Piano  . Sjogren's syndrome (HCC) 06/02/13   pt denies this (12/01/13)  . SLE (systemic lupus erythematosus) (HCC) dx 05/2013   follows with rheum Dareen Piano)  . Tachy-brady syndrome (HCC)    a. s/p STJ Leadless pacemaker 11-04-2014 Dr Johney Frame  . 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 ABDOMINAL AORTAGRAM N/A 11/24/2013   Procedure: ABDOMINAL AORTAGRAM WITH BILATERAL RUNOFF WITH POSSIBLE INTERVENTION;  Surgeon: Chuck Hint, MD;  Location: [facility] CATH LAB;  Service: Cardiovascular;  Laterality: N/A;  . ABDOMINAL AORTOGRAM W/LOWER EXTREMITY N/A 04/25/2019   Procedure: ABDOMINAL AORTOGRAM W/LOWER EXTREMITY;  Surgeon: Sherren Kerns, MD;  Location: [facility] INVASIVE CV LAB;  Service: Cardiovascular;  Laterality: N/A;  . AMPUTATION Left 12/02/2013   Procedure: LEFT 1ST TOE AMPUTATION;  Surgeon: Sherren Kerns, MD;  Location: [facility] OR;  Service: Vascular;  Laterality: Left;  . CARDIAC CATHETERIZATION N/A 07/12/2016   Procedure: Left Heart Cath and Coronary Angiography;  Surgeon: Lennette Bihari, MD;  Location: [facility] INVASIVE CV LAB;  Service: Cardiovascular;  Laterality: N/A;  . CARDIAC CATHETERIZATION N/A 07/14/2016   Procedure: Intravascular Ultrasound/IVUS;  Surgeon: Yvonne Kendall, MD;  Location: [facility] INVASIVE CV LAB;  Service: Cardiovascular;  Laterality: N/A;  . COLONOSCOPY    . CORONARY ANGIOPLASTY  04/2013  . CORONARY ARTERY BYPASS GRAFT N/A 07/17/2016   Procedure: CORONARY ARTERY BYPASS GRAFTING (CABG)x3 using right greater saphenous vein harvested endoscopiclly and left internal mammary artery;  Surgeon: Kerin Perna, MD;  Location: [facility]  OR;  Service: Open Heart Surgery;  Laterality: N/A;  . FEMORAL-POPLITEAL BYPASS GRAFT Left 12/02/2013   Procedure:  LEFT FEMORAL-BELOW KNEE POPLITEAL ARTERY BYPASS GRAFT;  Surgeon: Sherren Kerns, MD;  Location: [facility] OR;  Service: Vascular;  Laterality: Left;  . LEFT HEART CATHETERIZATION WITH CORONARY ANGIOGRAM N/A 05/19/2013   Procedure: LEFT HEART CATHETERIZATION WITH CORONARY ANGIOGRAM;  Surgeon: Micheline Chapman, MD;  Location: [facility] CATH LAB;  Service: Cardiovascular;  Laterality: N/A;  . LEFT HEART CATHETERIZATION WITH CORONARY ANGIOGRAM N/A 10/28/2014   Procedure: LEFT HEART CATHETERIZATION WITH CORONARY ANGIOGRAM;  Surgeon: Iran Ouch, MD;  Location: [facility] CATH LAB;  Service: Cardiovascular;  Laterality: N/A;  . PERCUTANEOUS CORONARY STENT INTERVENTION (PCI-S)  05/19/2013   Procedure: PERCUTANEOUS CORONARY STENT INTERVENTION (PCI-S);  Surgeon: Micheline Chapman, MD;  Location: [facility] CATH LAB;  Service: Cardiovascular;;  . PERMANENT PACEMAKER INSERTION N/A 11/04/2014   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  . TEE WITHOUT CARDIOVERSION N/A 07/17/2016   Procedure: TRANSESOPHAGEAL ECHOCARDIOGRAM (TEE);  Surgeon: Kerin Perna, MD;  Location: [facility] OR;  Service: Open Heart Surgery;  Laterality: N/A;  . TUBAL LIGATION    . VISCERAL ANGIOGRAPHY N/A 04/25/2019   Procedure: MESENTRIC  ANGIOGRAPHY;  Surgeon: Sherren Kerns, MD;  Location: [facility] INVASIVE CV LAB;  Service: Cardiovascular;  Laterality: N/A;    Allergies  Allergen Reactions  . Brilinta [Ticagrelor] Anaphylaxis and Other (See Comments)    Also, chest tightness  . Latex Rash    Prior to Admission medications   Medication Sig Start Date End Date Taking? Authorizing Provider  lisinopril (ZESTRIL) 10 MG tablet Take 1 tablet (10 mg total) by mouth daily. 05/07/19  Yes Cleaver, Thomasene Ripple, NP  metoprolol tartrate (LOPRESSOR) 25 MG tablet Take 0.5 tablets (12.5 mg total) by mouth 2 (two) times daily. 04/24/18  Yes Lewayne Bunting, MD  nitroGLYCERIN (NITROSTAT) 0.4 MG SL tablet Place 1 tablet (0.4 mg total) under the tongue every 5 (five) minutes as needed for chest pain. 10/09/18  Yes Seiler, Amber K, NP  oxyCODONE-acetaminophen (PERCOCET/ROXICET) 5-325 MG tablet Take 1 tablet by mouth every 6 (six) hours as needed for severe pain. 04/21/19  Yes Eveland, Matthew, PA-C  ELIQUIS 5 MG TABS tablet TAKE 1 TABLET BY MOUTH TWICE DAILY Patient taking differently: Take 5 mg by mouth 2 (two) times daily.  04/21/19   Marily Lente, NP    Social History   Socioeconomic History  . Marital status: Single    Spouse name: Not on file  . Number of children: 3  . Years of education: Not on file  . Highest education level: Not on file  Occupational History  . Occupation: retired  Engineer, production  . Financial resource strain: Not on file  . Food insecurity    Worry: Not on file    Inability: Not on file  . Transportation needs    Medical: Not on file    Non-medical: Not on  file  Tobacco Use  . Smoking status: Former Smoker    Types: Cigarettes    Quit date: 05/19/2016    Years since quitting: 2.9  . Smokeless tobacco: Never Used  . Tobacco comment:    Substance and Sexual Activity  . Alcohol use: No    Alcohol/week: 0.0 standard drinks    Frequency: Never  . Drug use: No  . Sexual activity: Not on file  Lifestyle  . Physical activity    Days per week: Not on file    Minutes per session: Not on file  . Stress: Not on file  Relationships  . Social Musicianconnections    Talks on phone: Not on file    Gets together: Not on file    Attends religious service: Not on file    Active member of club or organization: Not on file    Attends meetings of clubs or organizations: Not on file    Relationship status: Not on file  . Intimate partner violence    Fear of current or ex partner: Not on file    Emotionally abused: Not on file    Physically abused: Not on file    Forced sexual activity: Not on file  Other Topics Concern  .  Not on file  Social History Narrative   Lives alone.     Family History  Problem Relation Age of Onset  . Arthritis Mother   . Emphysema Mother   . Arthritis Father   . COPD Father   . Heart disease Father   . Diabetes Paternal Aunt        paternal Aunts  . Heart disease Paternal Aunt   . Thyroid disease Neg Hx     ROS: Otherwise negative unless mentioned in HPI  Physical Examination  Vitals:   05/09/19 1215 05/09/19 1230  BP: (!) 148/135 (!) 184/74  Pulse: 64 (!) 56  Resp:    Temp:    SpO2: 100% 100%   Body mass index is 17.81 kg/m.  General:  WDWN in NAD Gait: Not observed HENT: WNL, normocephalic Pulmonary: normal non-labored breathing Cardiac: irregular Abdomen:  soft, NT/ND, no masses Skin: without rashes Vascular Exam/Pulses: L foot cool to touch, soft peroneal by doppler mid lower leg, soft popliteal signal; monophasic doppler signal L femoral artery but no palpable pulse Extremities: L forefoot dusky with necrotic L 2nd toe  Musculoskeletal: no muscle wasting or atrophy  Neurologic: A&O X 3;  No focal weakness or paresthesias are detected; speech is fluent/normal Psychiatric:  The pt has Normal affect. Lymph:  Unremarkable  CBC    Component Value Date/Time   WBC 5.3 05/09/2019 1121   RBC 5.37 (H) 05/09/2019 1121   HGB 13.3 05/09/2019 1121   HGB 14.2 10/09/2018 1125   HCT 42.5 05/09/2019 1121   HCT 44.6 10/09/2018 1125   PLT 349 05/09/2019 1121   PLT 289 10/09/2018 1125   MCV 79.1 (L) 05/09/2019 1121   MCV 79 10/09/2018 1125   MCH 24.8 (L) 05/09/2019 1121   MCHC 31.3 05/09/2019 1121   RDW 15.9 (H) 05/09/2019 1121   RDW 14.8 10/09/2018 1125   LYMPHSABS 2.1 05/09/2019 1121   MONOABS 0.5 05/09/2019 1121   EOSABS 0.1 05/09/2019 1121   BASOSABS 0.1 05/09/2019 1121    BMET    Component Value Date/Time   NA 135 05/09/2019 1121   NA 135 10/09/2018 1125   K 4.1 05/09/2019 1121   CL 102 05/09/2019 1121   CO2 24  05/09/2019 1121   GLUCOSE 107  (H) 05/09/2019 1121   BUN 16 05/09/2019 1121   BUN 10 10/09/2018 1125   CREATININE 1.03 (H) 05/09/2019 1121   CREATININE 1.13 (H) 08/03/2016 1440   CALCIUM 9.7 05/09/2019 1121   GFRNONAA 57 (L) 05/09/2019 1121   GFRAA >60 05/09/2019 1121    COAGS: Lab Results  Component Value Date   INR 1.0 05/09/2019   INR 1.38 08/01/2016   INR 1.54 07/17/2016     Non-Invasive Vascular Imaging:   See angiography report from 04/25/19    ASSESSMENT/PLAN: This is a 63 y.o. female with critical limb ischemia LLE; known occluded L common femoral, deep femoral, and previous bypass  Ischemic rest pain LLE Start IV heparin; continue to hold Eliquis Ok to have a diet Plan is still to have redo bypass on Monday with Dr. Oneida Alar Patient aware she is at high risk for amputation   Dagoberto Ligas PA-C [facility] [phone]  I have independently interviewed and examined patient and agree with PA assessment and plan above.  Patient has worsened appearing ischemia in the left foot but I do believe this is chronic.  I reviewed her previous images with Dr. Oneida Alar.  We will hold Eliquis start heparin.  Plan will be for left femoral endarterectomy with peroneal artery bypass on Monday.  Custer Pimenta C. Donzetta Matters, MD [facility] [place] Office: [phone] Pager: [phone]

## 2019-05-09 NOTE — ED Triage Notes (Signed)
Pt reports came in last night and seen for left foot pain but left prior to being seen. Pt is scheduled for redo left femoral peroneal bypass on Monday, September 14 and having severe pain, warmth and reddness to affected extremity.

## 2019-05-09 NOTE — ED Notes (Signed)
Lab called heparin will need to be redrawn

## 2019-05-09 NOTE — ED Provider Notes (Signed)
[facility] EMERGENCY [facility]PARTMENT Provider Note   CSN: [id number] Arrival date & time: 05/09/19  [id number]     History   Chief Complaint Chief Complaint  Patient presents with   Foot Pain    HPI Mercedes ParkinsRhonda L [name] is a 63 y.o. female.     The history is provided by the patient and medical records. No language interpreter was used.  Foot Pain   Mercedes [name] is a 63 y.o. female  with a PMH as listed below including known PAD who presents to the Emergency Department complaining of progressively worsening pain to the left foot.  Patient states that the pain is been gradually worsening for quite some time now, but acutely worsened 2 or 3 days ago.  When she awoke this morning, pain was unbearable, prompting her to come to the emergency department.  She states that her second toe had a very small area of darkened coloration yesterday, but upon awakening this morning, had progressed to nearly the entire toe being a dusky color.  Followed by Dr. Darrick Pennafields of vascular surgery who has scheduled revision of her bypass graft for 14th of this month.  She actually was supposed to undergo a stress test for cardiac clearance today at 10 AM in order to have procedure on 9/14.    Past Medical History:  Diagnosis Date   Abnormal LFTs    a. in the past when on Lipitor   Anemia    as a young woman   Anxiety    Arnold-Chiari malformation, type I (HCC)    MRI brain 02/2010   CAD (coronary artery disease)    a. NSTEMI 07/2009: LHC - D2 40%, LAD irreg., EF 50% with apical AK (tako-tsubo CM);  b. Inf STEMI (04/2013): Tx Promus DES to CFX;  c. 08/2013 Lexi CL: No ischemia, dist ant/ap/inf-ap infarct, EF  d. 06/2016 NSTEMI with LM disease: s/p CABG on 07/17/2016 w/ LIMA-LAD, SVG-OM, and SVG-dRCA.   DEPRESSION    Dizziness    ? CVA 01/2010 - carotid dopplers with no ICA stenosis   GERD (gastroesophageal reflux disease)    HYPERLIPIDEMIA    HYPERTENSION    MI (myocardial  infarction) (HCC)    PAD (peripheral artery disease) (HCC)    s/p L fem pop 11/2013 in setting of 1st toe osteo/gangrene   Paroxysmal atrial fibrillation (HCC)    a. anticoagulated with Eliquis 10/2014   Presence of permanent cardiac pacemaker 10/2014   leadless permanent pacemaker   RA (rheumatoid arthritis) (HCC)    prior tx by Dr. 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   Acute pyelonephritis 10/20/2018   Acute renal failure superimposed on stage 2 chronic kidney disease (HCC) 10/20/2018   Diabetes (HCC) 04/01/2017   Hyperthyroidism 02/21/2017   Low mean corpuscular volume (MCV) 09/30/2016   Coronary artery disease involving native heart without angina pectoris    Hx of CABG 07/17/2016   Paroxysmal atrial fibrillation (HCC) 07/12/2016  NSTEMI (non-ST elevated myocardial infarction) (HCC) 07/11/2016   History of arterial bypass of lower extremity 12/30/2015   Claudication of right lower extremity (HCC) 12/30/2015   Phantom limb pain-Left great toe 04/29/2015   Seizure (HCC)    Atrial flutter with rapid ventricular response vs. SVT 10/27/2014    Class: Acute   Atherosclerosis of native artery of left lower extremity with rest pain (HCC) 12/18/2013   PAD (peripheral artery disease) (HCC) 12/02/2013   SLE (systemic lupus erythematosus) (HCC)    CAD - S/P Takotsubo MI in 2000, CFX DES Sept 2014 05/25/2013   Acute MI, inferior wall (HCC) 05/19/2013   ARNOLD-CHIARI  MALFORMATION 03/22/2010   Tachycardia-bradycardia (HCC) 03/09/2010   PVD- s/p Lt [facility] April 2015 10/29/2009   Dyslipidemia 08/26/2009   Essential hypertension 08/26/2009   CORONARY ATHEROSCLEROSIS NATIVE CORONARY ARTERY 08/26/2009   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   Rheumatoid arthritis (HCC) 08/26/2009    Past Surgical History:  Procedure Laterality Date   ABDOMINAL AORTAGRAM N/A 11/24/2013   Procedure: ABDOMINAL AORTAGRAM WITH BILATERAL RUNOFF WITH POSSIBLE INTERVENTION;  Surgeon: Chuck Hint, MD;  Location: [facility] CATH LAB;  Service: Cardiovascular;  Laterality: N/A;   ABDOMINAL AORTOGRAM W/LOWER EXTREMITY N/A 04/25/2019   Procedure: ABDOMINAL AORTOGRAM W/LOWER EXTREMITY;  Surgeon: Sherren Kerns, MD;  Location: [facility] INVASIVE CV LAB;  Service: Cardiovascular;  Laterality: N/A;   AMPUTATION Left 12/02/2013   Procedure: LEFT 1ST TOE AMPUTATION;  Surgeon: Sherren Kerns, MD;  Location: [facility] OR;  Service: Vascular;  Laterality: Left;   CARDIAC CATHETERIZATION N/A 07/12/2016   Procedure: Left Heart Cath and Coronary Angiography;  Surgeon: Lennette Bihari, MD;  Location: [facility] INVASIVE CV LAB;  Service: Cardiovascular;  Laterality: N/A;   CARDIAC CATHETERIZATION N/A 07/14/2016   Procedure: Intravascular Ultrasound/IVUS;  Surgeon: Yvonne Kendall, MD;  Location: [facility] INVASIVE CV LAB;  Service: Cardiovascular;  Laterality: N/A;   COLONOSCOPY     CORONARY ANGIOPLASTY  04/2013   CORONARY ARTERY BYPASS GRAFT N/A 07/17/2016   Procedure: CORONARY ARTERY BYPASS GRAFTING (CABG)x3 using right greater saphenous vein harvested endoscopiclly and left internal mammary artery;  Surgeon: Kerin Perna, MD;  Location: [facility] OR;  Service: Open Heart Surgery;  Laterality: N/A;   FEMORAL-POPLITEAL BYPASS GRAFT Left 12/02/2013   Procedure:  LEFT FEMORAL-BELOW KNEE POPLITEAL ARTERY BYPASS GRAFT;  Surgeon: Sherren Kerns, MD;  Location: [facility] OR;  Service: Vascular;  Laterality: Left;   LEFT HEART  CATHETERIZATION WITH CORONARY ANGIOGRAM N/A 05/19/2013   Procedure: LEFT HEART CATHETERIZATION WITH CORONARY ANGIOGRAM;  Surgeon: Micheline Chapman, MD;  Location: [facility] CATH LAB;  Service: Cardiovascular;  Laterality: N/A;   LEFT HEART CATHETERIZATION WITH CORONARY ANGIOGRAM N/A 10/28/2014   Procedure: LEFT HEART CATHETERIZATION WITH CORONARY ANGIOGRAM;  Surgeon: Iran Ouch, MD;  Location: [facility] CATH LAB;  Service: Cardiovascular;  Laterality: N/A;   PERCUTANEOUS CORONARY STENT INTERVENTION (PCI-S)  05/19/2013   Procedure: PERCUTANEOUS CORONARY STENT INTERVENTION (PCI-S);  Surgeon: Micheline Chapman, MD;  Location: [facility] CATH LAB;  Service: Cardiovascular;;   PERMANENT PACEMAKER INSERTION N/A 11/04/2014   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   TEE WITHOUT CARDIOVERSION N/A 07/17/2016   Procedure: TRANSESOPHAGEAL ECHOCARDIOGRAM (TEE);  Surgeon: Kerin Perna, MD;  Location: [facility] OR;  Service: Open Heart Surgery;  Laterality: N/A;   TUBAL LIGATION     VISCERAL ANGIOGRAPHY N/A 04/25/2019   Procedure: MESENTRIC  ANGIOGRAPHY;  Surgeon: Sherren Kerns, MD;  Location: [facility] INVASIVE CV LAB;  Service: Cardiovascular;  Laterality: N/A;     OB History   No obstetric history on file.      Home Medications    Prior to Admission medications   Medication Sig Start Date End Date Taking? Authorizing Provider  lisinopril (ZESTRIL) 10 MG tablet Take 1 tablet (10 mg total) by mouth daily. 05/07/19  Yes Cleaver, Thomasene Ripple, NP  metoprolol tartrate (LOPRESSOR) 25 MG tablet Take 0.5 tablets (12.5 mg total) by mouth 2 (two) times daily. 04/24/18  Yes Lewayne Bunting, MD  nitroGLYCERIN (NITROSTAT) 0.4 MG SL tablet Place 1 tablet (0.4 mg total) under the tongue every 5 (five) minutes as needed for chest pain. 10/09/18  Yes Seiler, Amber K, NP  oxyCODONE-acetaminophen (PERCOCET/ROXICET) 5-325 MG tablet Take 1 tablet by mouth every 6 (six) hours as needed for severe pain. 04/21/19  Yes Eveland,  Matthew, PA-C  ELIQUIS 5 MG TABS tablet TAKE 1 TABLET BY MOUTH TWICE DAILY Patient taking differently: Take 5 mg by mouth 2 (two) times daily.  04/21/19   Marily Lente, NP    Family History Family History  Problem Relation Age of Onset   Arthritis Mother    Emphysema Mother    Arthritis Father    COPD Father    Heart disease Father    Diabetes Paternal Aunt        paternal Aunts   Heart disease Paternal Aunt    Thyroid disease Neg Hx     Social History Social History   Tobacco Use   Smoking status: Former Smoker    Types: Cigarettes    Quit date: 05/19/2016    Years since quitting: 2.9   Smokeless tobacco: Never Used   Tobacco comment:    Substance Use Topics   Alcohol use: No    Alcohol/week: 0.0 standard drinks    Frequency: Never   Drug use: No     Allergies   Brilinta [ticagrelor] and Latex   Review of Systems Review of Systems  Musculoskeletal: Positive for arthralgias.  Skin: Positive for color change.  All other systems reviewed and are negative.    Physical Exam Updated Vital Signs BP (!) 144/92    Pulse (!) 57    Temp 98.8 F (37.1 C) (Oral)    Resp 16    Ht 5\' 5"  (1.651 m)    Wt 48.5 kg    SpO2 98%    BMI 17.81 kg/m   Physical Exam Vitals signs and nursing note reviewed.  Constitutional:      General: She is not in acute distress.    Appearance: She is well-developed.  HENT:     Head: Normocephalic and atraumatic.  Neck:     Musculoskeletal: Neck supple.  Cardiovascular:     Rate and Rhythm: Normal rate and regular rhythm.     Heart sounds: Normal heart sounds. No murmur.  Pulmonary:     Effort: Pulmonary effort is normal. No respiratory distress.     Breath sounds: Normal breath sounds.  Musculoskeletal:     Comments: See image below of left foot.  Unable to find DP or PT pulses with Doppler.  Skin:    General: Skin is warm and dry.  Neurological:     Mental Status: She is alert and oriented to person, place, and  time.        ED Treatments / Results  Labs (all labs ordered are listed, but only abnormal results are displayed) Labs Reviewed  HEPARIN LEVEL (UNFRACTIONATED) - Abnormal; Notable for the  following components:      Result Value   Heparin Unfractionated <0.10 (*)    All other components within normal limits  BASIC METABOLIC PANEL - Abnormal; Notable for the following components:   Glucose, Bld 107 (*)    Creatinine, Ser 1.03 (*)    GFR calc non Af Amer 57 (*)    All other components within normal limits  CBC WITH DIFFERENTIAL/PLATELET - Abnormal; Notable for the following components:   RBC 5.37 (*)    MCV 79.1 (*)    MCH 24.8 (*)    RDW 15.9 (*)    All other components within normal limits  SARS CORONAVIRUS 2 (TAT 6-24 HRS)  APTT  CBC WITH DIFFERENTIAL/PLATELET  HEPARIN LEVEL (UNFRACTIONATED)    EKG None  Radiology Vas [place] Abi With/wo Tbi  Result Date: 05/08/2019 LOWER EXTREMITY DOPPLER STUDY Indications: Peripheral artery disease, and Peripheral vascular disease. High Risk Factors: Hypertension, hyperlipidemia, coronary artery disease.  Vascular Interventions: Left femoral to below knee bypass graft on 12/02/13. Left                         great toe amputation. Performing Technologist: Thereasa ParkinHelene Cestone RVT  Examination Guidelines: A complete evaluation includes at minimum, Doppler waveform signals and systolic blood pressure reading at the level of bilateral brachial, anterior tibial, and posterior tibial arteries, when vessel segments are accessible. Bilateral testing is considered an integral part of a complete examination. Photoelectric Plethysmograph (PPG) waveforms and toe systolic pressure readings are included as required and additional duplex testing as needed. Limited examinations for reoccurring indications may be performed as noted.  ABI Findings: +---------+------------------+-----+-------------------+-----------------------+  Right     Rt Pressure (mmHg) Index Waveform             Comment                  +---------+------------------+-----+-------------------+-----------------------+  Brachial  169                                                                   +---------+------------------+-----+-------------------+-----------------------+  ATA       56                 0.33  dampened monophasic                          +---------+------------------+-----+-------------------+-----------------------+  PTA                                                    Patient requested to                                                             stop study               +---------+------------------+-----+-------------------+-----------------------+  Great Toe  Absent                                       +---------+------------------+-----+-------------------+-----------------------+ +--------+------------------+-----+--------+-------+  Left     Lt Pressure (mmHg) Index Waveform Comment  +--------+------------------+-----+--------+-------+  Brachial 171                                        +--------+------------------+-----+--------+-------+ +-------+--------------------+-----------+------------+------------+  ABI/TBI Today's ABI          Today's TBI Previous ABI Previous TBI  +-------+--------------------+-----------+------------+------------+  Right   0.33                 absent                                 +-------+--------------------+-----------+------------+------------+  Left    patient stopped exam                                        +-------+--------------------+-----------+------------+------------+ Technically limted study. Patient unable to remain supine, constant movement due to foot pain. Patient requested to stop the exam.  Summary: Right: Resting right ankle-brachial index indicates severe right lower extremity arterial disease. The right toe-brachial index is abnormal. Left: Patient stopped exam.  *See table(s) above for measurements and  observations.  Electronically signed by Fabienne Bruns MD on 05/08/2019 at 4:13:04 PM.   Final    Vas [place] Upper Extremity Vein Mapping  Result Date: 05/08/2019 UPPER EXTREMITY VEIN MAPPING  Indications: Pre op lower extremity bypass. Performing Technologist: Thereasa Parkin RVT  Examination Guidelines: A complete evaluation includes B-mode imaging, spectral Doppler, color Doppler, and power Doppler as needed of all accessible portions of each vessel. Bilateral testing is considered an integral part of a complete examination. Limited examinations for reoccurring indications may be performed as noted. +-----------------+-------------+----------+--------+  Right Cephalic    Diameter (cm) Depth (cm) Findings  +-----------------+-------------+----------+--------+  Shoulder              0.25                           +-----------------+-------------+----------+--------+  Prox upper arm        0.33                           +-----------------+-------------+----------+--------+  Mid upper arm      0.211>0.34                        +-----------------+-------------+----------+--------+  Dist upper arm        0.26                           +-----------------+-------------+----------+--------+  Antecubital fossa     0.29                           +-----------------+-------------+----------+--------+  Prox forearm          0.24                           +-----------------+-------------+----------+--------+  Mid forearm           0.23                           +-----------------+-------------+----------+--------+  Dist forearm          0.19                           +-----------------+-------------+----------+--------+ +--------------+-------------+----------+--------+  Right Basilic  Diameter (cm) Depth (cm) Findings  +--------------+-------------+----------+--------+  Prox upper arm     0.38                           +--------------+-------------+----------+--------+  Mid upper arm      0.46                            +--------------+-------------+----------+--------+  Dist upper arm     0.42                           +--------------+-------------+----------+--------+  Prox forearm       0.37                           +--------------+-------------+----------+--------+ +-----------------+-------------+----------+--------+  Left Cephalic     Diameter (cm) Depth (cm) Findings  +-----------------+-------------+----------+--------+  Prox upper arm        0.31                           +-----------------+-------------+----------+--------+  Mid upper arm         0.18                           +-----------------+-------------+----------+--------+  Dist upper arm        0.19                           +-----------------+-------------+----------+--------+  Antecubital fossa     0.37                           +-----------------+-------------+----------+--------+  Prox forearm          0.08                           +-----------------+-------------+----------+--------+  Mid forearm           0.16                           +-----------------+-------------+----------+--------+  Dist forearm          0.16                           +-----------------+-------------+----------+--------+ +-----------------+-------------+----------+--------+  Left Basilic      Diameter (cm) Depth (cm) Findings  +-----------------+-------------+----------+--------+  Prox upper arm        0.34                           +-----------------+-------------+----------+--------+  Mid upper arm         0.39                           +-----------------+-------------+----------+--------+  Dist upper arm        0.41                           +-----------------+-------------+----------+--------+  Antecubital fossa     0.28                           +-----------------+-------------+----------+--------+  Prox forearm          0.20                           +-----------------+-------------+----------+--------+ Summary: Right: Patent cephalic and basilic veins. Left: Unable to obtain  PSV noted on exam in 2016. *See table(s) above for measurements and observations.  Diagnosing physician: Ruta Hinds MD Electronically signed by Ruta Hinds MD on 05/08/2019 at 4:11:21 PM.    Final     Procedures Procedures (including critical care time)  CRITICAL CARE Performed by: Ozella Almond Connell Bognar   Total critical care time: 35 minutes  Critical care time was exclusive of separately billable procedures and treating other patients.  Critical care was necessary to treat or prevent imminent or life-threatening deterioration.  Critical care was time spent personally by me on the following activities: development of treatment plan with patient and/or surrogate as well as nursing, discussions with consultants, evaluation of patient's response to treatment, examination of patient, obtaining history from patient or surrogate, ordering and performing treatments and interventions, ordering and review of laboratory studies, ordering and review of radiographic studies, pulse oximetry and re-evaluation of patient's condition.   Medications Ordered in ED Medications  heparin ADULT infusion 100 units/mL (25000 units/223mL sodium chloride 0.45%) (800 Units/hr Intravenous New Bag/Given 05/09/19 1242)  fentaNYL (SUBLIMAZE) injection 50 mcg (50 mcg Intravenous Given 05/09/19 1119)  HYDROmorphone (DILAUDID) injection 1 mg (1 mg Intravenous Given 05/09/19 1234)     Initial Impression / Assessment and Plan / ED Course  I have reviewed the triage vital signs and the nursing notes.  Pertinent labs & imaging results that were available during my care of the patient were reviewed by me and considered in my medical decision making (see chart for details).       MYKELL GENAO is a 63 y.o. female who presents to ED for pain to left foot and exam consistent with limb ischemia.  Unable to palpate or obtain dopplerable pulses to the left foot.  She has known vascular disease followed by Dr. Oneida Alar with  upcoming surgery scheduled on the 14th.  Started on heparin.  Vascular surgery consulted who will admit.   Patient seen by and discussed with Dr. Regenia Skeeter who agrees with treatment plan.   Final Clinical Impressions(s) / ED Diagnoses   Final diagnoses:  Limb ischemia    ED Discharge Orders    None       Japneet Staggs, Ozella Almond, PA-C 05/09/19 1400    Sherwood Gambler, MD 05/09/19 1455

## 2019-05-09 NOTE — ED Notes (Signed)
Paged Dr Donzetta Matters, for information on diet, meds, and admission.

## 2019-05-09 NOTE — Telephone Encounter (Signed)
Called son back- he states they went to ER last night, and nothing was done, but went back today because his mothers foot is turning colors, and was red and had spots all over and painful for her. I advised the hospital was the best option at this point as they were already there, he just wanted Dr.Crenshaw to be made aware.

## 2019-05-09 NOTE — ED Notes (Signed)
Patient left AMA.  Stated that she had an appointment tomorrow morining.

## 2019-05-09 NOTE — Telephone Encounter (Signed)
New Message:      Pt was admitted to [facility] last night.. Son wants to talk to Dr Stanford Breed about his mother.

## 2019-05-09 NOTE — Telephone Encounter (Signed)
Opened in error

## 2019-05-09 NOTE — ED Triage Notes (Signed)
C/o left foot pain going on for weeks; pt stated pending procedure r/t circulation issues

## 2019-05-09 NOTE — ED Notes (Signed)
Patient refusing blood draw at this time, patient stated that " I want to know where my medicine is, what are you going to do for my pain, and I need to eat."  Reported this to RN.

## 2019-05-10 ENCOUNTER — Encounter (HOSPITAL_COMMUNITY): Payer: Self-pay

## 2019-05-10 LAB — CBC
HCT: 38.4 % (ref 36.0–46.0)
Hemoglobin: 12 g/dL (ref 12.0–15.0)
MCH: 24.9 pg — ABNORMAL LOW (ref 26.0–34.0)
MCHC: 31.3 g/dL (ref 30.0–36.0)
MCV: 79.7 fL — ABNORMAL LOW (ref 80.0–100.0)
Platelets: 337 10*3/uL (ref 150–400)
RBC: 4.82 MIL/uL (ref 3.87–5.11)
RDW: 15.8 % — ABNORMAL HIGH (ref 11.5–15.5)
WBC: 5.5 10*3/uL (ref 4.0–10.5)
nRBC: 0 % (ref 0.0–0.2)

## 2019-05-10 LAB — HEPARIN LEVEL (UNFRACTIONATED): Heparin Unfractionated: 0.58 IU/mL (ref 0.30–0.70)

## 2019-05-10 MED ORDER — ASPIRIN EC 81 MG PO TBEC
81.0000 mg | DELAYED_RELEASE_TABLET | Freq: Every day | ORAL | Status: DC
  Administered 2019-05-10 – 2019-05-16 (×6): 81 mg via ORAL
  Filled 2019-05-10 (×6): qty 1

## 2019-05-10 MED ORDER — ROSUVASTATIN CALCIUM 5 MG PO TABS
10.0000 mg | ORAL_TABLET | Freq: Every day | ORAL | Status: DC
  Administered 2019-05-10 – 2019-05-15 (×6): 10 mg via ORAL
  Filled 2019-05-10 (×6): qty 2

## 2019-05-10 NOTE — Progress Notes (Signed)
Received pt from ED, alert oriented x4 with VSS. Pt with dusky toes and redness to her left foot and in mild pain. Oriented to room, bed controls and plan of care. Left lying comfortably in bed with call bell at reach. Will continue to monitor.

## 2019-05-10 NOTE — Progress Notes (Signed)
ANTICOAGULATION CONSULT NOTE  Pharmacy Consult for heparin Indication: ischemic foot Heparin Dosing Weight: 48.5 kg  Labs: Recent Labs    05/09/19 0121 05/09/19 1121 05/09/19 1944 05/09/19 2135 05/10/19 0343  HGB 12.9 13.3 13.5  --  12.0  HCT 41.0 42.5 43.0  --  38.4  PLT 340 349 308  --  337  APTT  --  28  --   --   --   LABPROT 13.1  --   --  14.0  --   INR 1.0  --   --  1.1  --   HEPARINUNFRC  --  <0.10*  --  0.39 0.58  CREATININE 1.08* 1.03* 0.97  --   --     Assessment: 14 yof on apixaban PTA for PAF presenting with foot pain. Pharmacy consulted to dose heparin for ischemic foot. Patient was originally scheduled for redo L femoral peroneal bypass on 9/14 with Vascular Surgery. Last dose of apixaban was "a couple days ago" PTA per patient, does not recall exact last dose. Baseline heparin level was undetectable.  Heparin level - within goal at 0.58. No signs or symptoms of bleeding noted. Hgb slightly down to 12 from 13.5, hct 38.4.  Goal of Therapy:  Heparin level 0.3 - 0.7 Monitor platelets by anticoagulation protocol: Yes   Plan:  Continue heparin at 800 units/h Daily heparin level / CBC Monitor s/sx bleeding F/u Vascular plans  Thank you for the interesting consult and for involving pharmacy in this patient's care.  Tamela Gammon, PharmD 05/10/2019 7:30 AM PGY-2 Pharmacy Administration Resident Direct Phone: [phone] Please check [email or web address] for unit-specific pharmacist phone numbers

## 2019-05-10 NOTE — Progress Notes (Signed)
  Progress Note    05/10/2019 11:25 AM * No surgery found *  Subjective: Still having left foot pain   Vitals:   05/09/19 2132 05/10/19 0431  BP: (!) 144/79 118/65  Pulse: (!) 53 (!) 51  Resp: 18 18  Temp: 99 F (37.2 C) 98.6 F (37 C)  SpO2: 100% 98%    Physical Exam: Awake alert oriented Nonlabored respirations No left femoral pulses palpable Left foot is cool although sensorimotor intact  CBC    Component Value Date/Time   WBC 5.5 05/10/2019 0343   RBC 4.82 05/10/2019 0343   HGB 12.0 05/10/2019 0343   HGB 14.2 10/09/2018 1125   HCT 38.4 05/10/2019 0343   HCT 44.6 10/09/2018 1125   PLT 337 05/10/2019 0343   PLT 289 10/09/2018 1125   MCV 79.7 (L) 05/10/2019 0343   MCV 79 10/09/2018 1125   MCH 24.9 (L) 05/10/2019 0343   MCHC 31.3 05/10/2019 0343   RDW 15.8 (H) 05/10/2019 0343   RDW 14.8 10/09/2018 1125   LYMPHSABS 2.1 05/09/2019 1121   MONOABS 0.5 05/09/2019 1121   EOSABS 0.1 05/09/2019 1121   BASOSABS 0.1 05/09/2019 1121    BMET    Component Value Date/Time   NA 136 05/09/2019 1944   NA 135 10/09/2018 1125   K 4.3 05/09/2019 1944   CL 104 05/09/2019 1944   CO2 23 05/09/2019 1944   GLUCOSE 89 05/09/2019 1944   BUN 16 05/09/2019 1944   BUN 10 10/09/2018 1125   CREATININE 0.97 05/09/2019 1944   CREATININE 1.13 (H) 08/03/2016 1440   CALCIUM 9.7 05/09/2019 1944   GFRNONAA >60 05/09/2019 1944   GFRAA >60 05/09/2019 1944    INR    Component Value Date/Time   INR 1.1 05/09/2019 2135     Intake/Output Summary (Last 24 hours) at 05/10/2019 1125 Last data filed at 05/10/2019 0948 Gross per 24 hour  Intake 685.62 ml  Output 500 ml  Net 185.62 ml     Assessment/plan:  63 y.o. Mercedes [name] is here with left foot pain plan for left common femoral endarterectomy and femoral to peroneal artery bypass with Dr. Oneida Alar on Monday.  She is on heparin drip.  Will initiate aspirin and statin therapy.   VQI: asa and statin  Renji Berwick C. Donzetta Matters, MD [facility] Office: [phone] Pager: [phone]  05/10/2019 11:25 AM

## 2019-05-11 LAB — BLOOD GAS, ARTERIAL
Acid-base deficit: 2.3 mmol/L — ABNORMAL HIGH (ref 0.0–2.0)
Bicarbonate: 20.7 mmol/L (ref 20.0–28.0)
Drawn by: 330991
FIO2: 21
O2 Saturation: 99.2 %
Patient temperature: 98.6
pCO2 arterial: 28.1 mmHg — ABNORMAL LOW (ref 32.0–48.0)
pH, Arterial: 7.481 — ABNORMAL HIGH (ref 7.350–7.450)
pO2, Arterial: 140 mmHg — ABNORMAL HIGH (ref 83.0–108.0)

## 2019-05-11 LAB — CBC
HCT: 37.9 % (ref 36.0–46.0)
Hemoglobin: 12 g/dL (ref 12.0–15.0)
MCH: 25 pg — ABNORMAL LOW (ref 26.0–34.0)
MCHC: 31.7 g/dL (ref 30.0–36.0)
MCV: 79 fL — ABNORMAL LOW (ref 80.0–100.0)
Platelets: 312 10*3/uL (ref 150–400)
RBC: 4.8 MIL/uL (ref 3.87–5.11)
RDW: 15.6 % — ABNORMAL HIGH (ref 11.5–15.5)
WBC: 4.7 10*3/uL (ref 4.0–10.5)
nRBC: 0 % (ref 0.0–0.2)

## 2019-05-11 LAB — TYPE AND SCREEN
ABO/RH(D): B POS
Antibody Screen: NEGATIVE

## 2019-05-11 LAB — PREPARE RBC (CROSSMATCH)

## 2019-05-11 LAB — HEPARIN LEVEL (UNFRACTIONATED)
Heparin Unfractionated: 0.75 IU/mL — ABNORMAL HIGH (ref 0.30–0.70)
Heparin Unfractionated: 0.44 IU/mL (ref 0.30–0.70)

## 2019-05-11 LAB — SURGICAL PCR SCREEN
MRSA, PCR: NEGATIVE
Staphylococcus aureus: NEGATIVE

## 2019-05-11 MED ORDER — CHLORHEXIDINE GLUCONATE CLOTH 2 % EX PADS
6.0000 | MEDICATED_PAD | Freq: Once | CUTANEOUS | Status: AC
  Administered 2019-05-11: 20:00:00 6 via TOPICAL

## 2019-05-11 MED ORDER — SODIUM CHLORIDE 0.9% IV SOLUTION: Freq: Once | INTRAVENOUS | Status: DC

## 2019-05-11 MED ORDER — SODIUM CHLORIDE 0.9 % IV SOLN: INTRAVENOUS | Status: DC

## 2019-05-11 MED ORDER — CEFAZOLIN SODIUM-DEXTROSE 2-4 GM/100ML-% IV SOLN
2.0000 g | INTRAVENOUS | Status: AC
  Administered 2019-05-12: 2 g via INTRAVENOUS
  Filled 2019-05-11: qty 100

## 2019-05-11 MED ORDER — CHLORHEXIDINE GLUCONATE CLOTH 2 % EX PADS
6.0000 | MEDICATED_PAD | Freq: Once | CUTANEOUS | Status: AC
  Administered 2019-05-12: 6 via TOPICAL

## 2019-05-11 NOTE — H&P (View-Only) (Signed)
  Progress Note    05/11/2019 11:00 AM * No surgery date entered *  Subjective: Still having left foot pain  Vitals:   05/11/19 0635 05/11/19 0930  BP: (!) 138/55 109/64  Pulse: (!) 46 (!) 51  Resp: 18   Temp: 98.5 F (36.9 C)   SpO2: 100% 100%    Physical Exam: Awake alert oriented On the respirations Abdomen is soft No palpable left inguinal pulse Left foot tender to palpation stable dusky changes left second toe with well-healed left first toe amputation  CBC    Component Value Date/Time   WBC 4.7 05/11/2019 0239   RBC 4.80 05/11/2019 0239   HGB 12.0 05/11/2019 0239   HGB 14.2 10/09/2018 1125   HCT 37.9 05/11/2019 0239   HCT 44.6 10/09/2018 1125   PLT 312 05/11/2019 0239   PLT 289 10/09/2018 1125   MCV 79.0 (L) 05/11/2019 0239   MCV 79 10/09/2018 1125   MCH 25.0 (L) 05/11/2019 0239   MCHC 31.7 05/11/2019 0239   RDW 15.6 (H) 05/11/2019 0239   RDW 14.8 10/09/2018 1125   LYMPHSABS 2.1 05/09/2019 1121   MONOABS 0.5 05/09/2019 1121   EOSABS 0.1 05/09/2019 1121   BASOSABS 0.1 05/09/2019 1121    BMET    Component Value Date/Time   NA 136 05/09/2019 1944   NA 135 10/09/2018 1125   K 4.3 05/09/2019 1944   CL 104 05/09/2019 1944   CO2 23 05/09/2019 1944   GLUCOSE 89 05/09/2019 1944   BUN 16 05/09/2019 1944   BUN 10 10/09/2018 1125   CREATININE 0.97 05/09/2019 1944   CREATININE 1.13 (H) 08/03/2016 1440   CALCIUM 9.7 05/09/2019 1944   GFRNONAA >60 05/09/2019 1944   GFRAA >60 05/09/2019 1944    INR    Component Value Date/Time   INR 1.1 05/09/2019 2135     Intake/Output Summary (Last 24 hours) at 05/11/2019 1100 Last data filed at 05/11/2019 0945 Gross per 24 hour  Intake 1619.25 ml  Output 1350 ml  Net 269.25 ml     Assessment/plan:  63 y.o. female is here with left lower extremity rest pain.  On heparin drip.  Have initiated aspirin and statin.  Plan is for OR tomorrow with Dr. fields for left common femoral endarterectomy bypass to the  peroneal artery.  She will be n.p.o. past midnight.    Belvia Gotschall C. Decklan Mau, MD [facility] Office: [phone] Pager: [phone]  05/11/2019 11:00 AM  

## 2019-05-11 NOTE — Progress Notes (Signed)
ANTICOAGULATION CONSULT NOTE  Pharmacy Consult for Heparin Indication: ischemic foot  Heparin Dosing Weight: 48.5 kg  Labs: Recent Labs    05/09/19 0121  05/09/19 1121 05/09/19 1944 05/09/19 2135 05/10/19 0343 05/11/19 0239 05/11/19 1237  HGB 12.9  --  13.3 13.5  --  12.0 12.0  --   HCT 41.0  --  42.5 43.0  --  38.4 37.9  --   PLT 340  --  349 308  --  337 312  --   APTT  --   --  28  --   --   --   --   --   LABPROT 13.1  --   --   --  14.0  --   --   --   INR 1.0  --   --   --  1.1  --   --   --   HEPARINUNFRC  --    < > <0.10*  --  0.39 0.58 0.75* 0.44  CREATININE 1.08*  --  1.03* 0.97  --   --   --   --    < > = values in this interval not displayed.    Assessment: 4 yof on apixaban PTA for PAF presenting with foot pain. Pharmacy consulted to dose heparin for ischemic foot. Patient was originally scheduled for redo L femoral peroneal bypass on 9/14 with Vascular Surgery. Last dose of apixaban was "a couple days ago" PTA per patient, does not recall exact last dose. CBC wnl.   9/13: Heparin level this morning was supra-therapeutic; therapeutic this afternoon after decrease in rate. hgb 12, hct 37.9, platelets 312.  Goal of Therapy:  Heparin level 0.3-0.7 units/mL Monitor platelets by anticoagulation protocol: Yes   Plan:  Continue heparin to 650 units/hr Daily Heparin level and CBC  Thank you for the interesting consult and for involving pharmacy in this patient's care.  Tamela Gammon, PharmD 05/11/2019 2:04 PM PGY-2 Pharmacy Administration Resident Direct Phone: [phone] Please check [email or web address] for unit-specific pharmacist phone numbers

## 2019-05-11 NOTE — Progress Notes (Signed)
ANTICOAGULATION CONSULT NOTE  Pharmacy Consult for Heparin Indication: ischemic foot  Heparin Dosing Weight: 48.5 kg  Labs: Recent Labs    05/09/19 0121  05/09/19 1121 05/09/19 1944 05/09/19 2135 05/10/19 0343 05/11/19 0239  HGB 12.9  --  13.3 13.5  --  12.0 12.0  HCT 41.0  --  42.5 43.0  --  38.4 37.9  PLT 340  --  349 308  --  337 312  APTT  --   --  28  --   --   --   --   LABPROT 13.1  --   --   --  14.0  --   --   INR 1.0  --   --   --  1.1  --   --   HEPARINUNFRC  --    < > <0.10*  --  0.39 0.58 0.75*  CREATININE 1.08*  --  1.03* 0.97  --   --   --    < > = values in this interval not displayed.    Assessment: 11 yof on apixaban PTA for PAF presenting with foot pain. Pharmacy consulted to dose heparin for ischemic foot. Patient was originally scheduled for redo L femoral peroneal bypass on 9/14 with Vascular Surgery. Last dose of apixaban was "a couple days ago" PTA per patient, does not recall exact last dose. CBC wnl.   9/13 AM update:  Heparin level elevated this AM CBC stable   Goal of Therapy:  Heparin level 0.3-0.7 units/mL Monitor platelets by anticoagulation protocol: Yes   Plan:  Dec heparin to 650 units/hr Re-check heparin level in 8 hours  Narda Bonds, PharmD, [place] Pharmacist Phone: [phone]

## 2019-05-11 NOTE — Progress Notes (Signed)
  Progress Note    05/11/2019 11:00 AM * No surgery date entered *  Subjective: Still having left foot pain  Vitals:   05/11/19 0635 05/11/19 0930  BP: (!) 138/55 109/64  Pulse: (!) 46 (!) 51  Resp: 18   Temp: 98.5 F (36.9 C)   SpO2: 100% 100%    Physical Exam: Awake alert oriented On the respirations Abdomen is soft No palpable left inguinal pulse Left foot tender to palpation stable dusky changes left second toe with well-healed left first toe amputation  CBC    Component Value Date/Time   WBC 4.7 05/11/2019 0239   RBC 4.80 05/11/2019 0239   HGB 12.0 05/11/2019 0239   HGB 14.2 10/09/2018 1125   HCT 37.9 05/11/2019 0239   HCT 44.6 10/09/2018 1125   PLT 312 05/11/2019 0239   PLT 289 10/09/2018 1125   MCV 79.0 (L) 05/11/2019 0239   MCV 79 10/09/2018 1125   MCH 25.0 (L) 05/11/2019 0239   MCHC 31.7 05/11/2019 0239   RDW 15.6 (H) 05/11/2019 0239   RDW 14.8 10/09/2018 1125   LYMPHSABS 2.1 05/09/2019 1121   MONOABS 0.5 05/09/2019 1121   EOSABS 0.1 05/09/2019 1121   BASOSABS 0.1 05/09/2019 1121    BMET    Component Value Date/Time   NA 136 05/09/2019 1944   NA 135 10/09/2018 1125   K 4.3 05/09/2019 1944   CL 104 05/09/2019 1944   CO2 23 05/09/2019 1944   GLUCOSE 89 05/09/2019 1944   BUN 16 05/09/2019 1944   BUN 10 10/09/2018 1125   CREATININE 0.97 05/09/2019 1944   CREATININE 1.13 (H) 08/03/2016 1440   CALCIUM 9.7 05/09/2019 1944   GFRNONAA >60 05/09/2019 1944   GFRAA >60 05/09/2019 1944    INR    Component Value Date/Time   INR 1.1 05/09/2019 2135     Intake/Output Summary (Last 24 hours) at 05/11/2019 1100 Last data filed at 05/11/2019 0945 Gross per 24 hour  Intake 1619.25 ml  Output 1350 ml  Net 269.25 ml     Assessment/plan:  63 y.o. female is here with left lower extremity rest pain.  On heparin drip.  Have initiated aspirin and statin.  Plan is for OR tomorrow with Dr. Oneida Alar for left common femoral endarterectomy bypass to the  peroneal artery.  She will be n.p.o. past midnight.    Samayra Hebel C. Donzetta Matters, MD [facility] [place] Office: [phone] Pager: [phone]  05/11/2019 11:00 AM

## 2019-05-12 ENCOUNTER — Inpatient Hospital Stay (HOSPITAL_COMMUNITY): Admission: RE | Admit: 2019-05-12 | Payer: Medicare Other | Source: Home / Self Care | Admitting: Vascular Surgery

## 2019-05-12 ENCOUNTER — Inpatient Hospital Stay (HOSPITAL_COMMUNITY): Payer: Medicare Other | Admitting: Anesthesiology

## 2019-05-12 ENCOUNTER — Inpatient Hospital Stay (HOSPITAL_COMMUNITY): Payer: Medicare Other | Admitting: Vascular Surgery

## 2019-05-12 ENCOUNTER — Inpatient Hospital Stay (HOSPITAL_COMMUNITY): Payer: Medicare Other

## 2019-05-12 ENCOUNTER — Encounter (HOSPITAL_COMMUNITY)
Admission: EM | Disposition: A | Discharge status: Home Health | Payer: Self-pay | Source: Home / Self Care | Attending: Vascular Surgery

## 2019-05-12 ENCOUNTER — Encounter (HOSPITAL_COMMUNITY): Payer: Self-pay | Admitting: Anesthesiology

## 2019-05-12 DIAGNOSIS — I70222 Atherosclerosis of native arteries of extremities with rest pain, left leg: Secondary | ICD-10-CM

## 2019-05-12 DIAGNOSIS — T82868A Thrombosis of vascular prosthetic devices, implants and grafts, initial encounter: Secondary | ICD-10-CM

## 2019-05-12 HISTORY — PX: ENDARTERECTOMY FEMORAL: SHX5804

## 2019-05-12 HISTORY — PX: PATCH ANGIOPLASTY: SHX6230

## 2019-05-12 HISTORY — PX: THROMBECTOMY FEMORAL ARTERY: SHX6406

## 2019-05-12 HISTORY — PX: LOWER EXTREMITY ANGIOGRAM: SHX5508

## 2019-05-12 LAB — BASIC METABOLIC PANEL
Anion gap: 6 (ref 5–15)
BUN: 8 mg/dL (ref 8–23)
CO2: 23 mmol/L (ref 22–32)
Calcium: 8.6 mg/dL — ABNORMAL LOW (ref 8.9–10.3)
Chloride: 106 mmol/L (ref 98–111)
Creatinine, Ser: 0.97 mg/dL (ref 0.44–1.00)
GFR calc Af Amer: >60 mL/min (ref >60)
GFR calc non Af Amer: >60 mL/min (ref >60)
Glucose, Bld: 101 mg/dL — ABNORMAL HIGH (ref 70–99)
Potassium: 4.1 mmol/L (ref 3.5–5.1)
Sodium: 135 mmol/L (ref 135–145)

## 2019-05-12 LAB — CBC
HCT: 36.2 % (ref 36.0–46.0)
Hemoglobin: 11.1 g/dL — ABNORMAL LOW (ref 12.0–15.0)
MCH: 24.4 pg — ABNORMAL LOW (ref 26.0–34.0)
MCHC: 30.7 g/dL (ref 30.0–36.0)
MCV: 79.7 fL — ABNORMAL LOW (ref 80.0–100.0)
Platelets: 324 10*3/uL (ref 150–400)
RBC: 4.54 MIL/uL (ref 3.87–5.11)
RDW: 15.8 % — ABNORMAL HIGH (ref 11.5–15.5)
WBC: 5 10*3/uL (ref 4.0–10.5)
nRBC: 0 % (ref 0.0–0.2)

## 2019-05-12 LAB — PROTIME-INR
INR: 1.2 (ref 0.8–1.2)
Prothrombin Time: 14.7 seconds (ref 11.4–15.2)

## 2019-05-12 LAB — HEPARIN LEVEL (UNFRACTIONATED): Heparin Unfractionated: 0.56 IU/mL (ref 0.30–0.70)

## 2019-05-12 LAB — APTT: aPTT: 90 seconds — ABNORMAL HIGH (ref 24–36)

## 2019-05-12 SURGERY — ANGIOGRAM, LOWER EXTREMITY
Anesthesia: General | Site: Leg Upper | Laterality: Left

## 2019-05-12 MED ORDER — HEPARIN SODIUM (PORCINE) 1000 UNIT/ML IJ SOLN
INTRAMUSCULAR | Status: DC | PRN
  Administered 2019-05-12: 6000 [IU] via INTRAVENOUS

## 2019-05-12 MED ORDER — SODIUM CHLORIDE 0.9 % IV SOLN
INTRAVENOUS | Status: AC
  Filled 2019-05-12: qty 1.2

## 2019-05-12 MED ORDER — OXYCODONE HCL 5 MG/5ML PO SOLN: 5.0000 mg | Freq: Once | ORAL | Status: AC | PRN

## 2019-05-12 MED ORDER — SUGAMMADEX SODIUM 200 MG/2ML IV SOLN
INTRAVENOUS | Status: DC | PRN
  Administered 2019-05-12: 100 mg via INTRAVENOUS

## 2019-05-12 MED ORDER — OXYCODONE HCL 5 MG PO TABS
5.0000 mg | ORAL_TABLET | Freq: Once | ORAL | Status: AC | PRN
  Administered 2019-05-12: 5 mg via ORAL

## 2019-05-12 MED ORDER — FENTANYL CITRATE (PF) 100 MCG/2ML IJ SOLN
INTRAMUSCULAR | Status: AC
  Filled 2019-05-12: qty 2

## 2019-05-12 MED ORDER — FENTANYL CITRATE (PF) 100 MCG/2ML IJ SOLN
INTRAMUSCULAR | Status: DC | PRN
  Administered 2019-05-12: 25 ug via INTRAVENOUS
  Administered 2019-05-12: 50 ug via INTRAVENOUS
  Administered 2019-05-12: 25 ug via INTRAVENOUS
  Administered 2019-05-12: 75 ug via INTRAVENOUS
  Administered 2019-05-12: 25 ug via INTRAVENOUS

## 2019-05-12 MED ORDER — SODIUM CHLORIDE 0.9 % IV SOLN
INTRAVENOUS | Status: DC | PRN
  Administered 2019-05-12: 09:00:00 25 ug/min via INTRAVENOUS

## 2019-05-12 MED ORDER — LACTATED RINGERS IV SOLN
INTRAVENOUS | Status: DC | PRN
  Administered 2019-05-12: 08:00:00 via INTRAVENOUS

## 2019-05-12 MED ORDER — HEPARIN (PORCINE) 25000 UT/250ML-% IV SOLN
800.0000 [IU]/h | INTRAVENOUS | Status: DC
  Administered 2019-05-12: 650 [IU]/h via INTRAVENOUS
  Administered 2019-05-14 – 2019-05-15 (×2): 750 [IU]/h via INTRAVENOUS
  Filled 2019-05-12 (×3): qty 250

## 2019-05-12 MED ORDER — PROPOFOL 10 MG/ML IV BOLUS
INTRAVENOUS | Status: DC | PRN
  Administered 2019-05-12: 120 mg via INTRAVENOUS

## 2019-05-12 MED ORDER — PHENYLEPHRINE HCL (PRESSORS) 10 MG/ML IV SOLN
INTRAVENOUS | Status: DC | PRN
  Administered 2019-05-12 (×3): 40 ug via INTRAVENOUS
  Administered 2019-05-12 (×2): 80 ug via INTRAVENOUS
  Administered 2019-05-12: 120 ug via INTRAVENOUS
  Administered 2019-05-12: 40 ug via INTRAVENOUS
  Administered 2019-05-12: 80 ug via INTRAVENOUS

## 2019-05-12 MED ORDER — DEXAMETHASONE SODIUM PHOSPHATE 10 MG/ML IJ SOLN
INTRAMUSCULAR | Status: DC | PRN
  Administered 2019-05-12: 5 mg via INTRAVENOUS

## 2019-05-12 MED ORDER — POLYETHYLENE GLYCOL 3350 17 G PO PACK: 17.0000 g | PACK | Freq: Every day | ORAL | Status: DC | PRN

## 2019-05-12 MED ORDER — OXYCODONE HCL 5 MG PO TABS
ORAL_TABLET | ORAL | Status: AC
  Filled 2019-05-12: qty 1

## 2019-05-12 MED ORDER — ONDANSETRON HCL 4 MG/2ML IJ SOLN: 4.0000 mg | Freq: Once | INTRAMUSCULAR | Status: DC | PRN

## 2019-05-12 MED ORDER — LIDOCAINE 2% (20 MG/ML) 5 ML SYRINGE
INTRAMUSCULAR | Status: DC | PRN
  Administered 2019-05-12: 60 mg via INTRAVENOUS

## 2019-05-12 MED ORDER — ROCURONIUM BROMIDE 50 MG/5ML IV SOSY
PREFILLED_SYRINGE | INTRAVENOUS | Status: DC | PRN
  Administered 2019-05-12: 60 mg via INTRAVENOUS

## 2019-05-12 MED ORDER — SODIUM CHLORIDE 0.9 % IV SOLN: 500.0000 mL | Freq: Once | INTRAVENOUS | Status: DC | PRN

## 2019-05-12 MED ORDER — FENTANYL CITRATE (PF) 100 MCG/2ML IJ SOLN
25.0000 ug | INTRAMUSCULAR | Status: DC | PRN
  Administered 2019-05-12: 25 ug via INTRAVENOUS
  Administered 2019-05-12: 50 ug via INTRAVENOUS
  Administered 2019-05-12: 25 ug via INTRAVENOUS

## 2019-05-12 MED ORDER — DOCUSATE SODIUM 100 MG PO CAPS
100.0000 mg | ORAL_CAPSULE | Freq: Every day | ORAL | Status: DC
  Administered 2019-05-13 – 2019-05-16 (×4): 100 mg via ORAL
  Filled 2019-05-12 (×3): qty 1

## 2019-05-12 MED ORDER — POTASSIUM CHLORIDE CRYS ER 20 MEQ PO TBCR: 20.0000 meq | EXTENDED_RELEASE_TABLET | Freq: Every day | ORAL | Status: DC | PRN

## 2019-05-12 MED ORDER — GLYCOPYRROLATE PF 0.2 MG/ML IJ SOSY
PREFILLED_SYRINGE | INTRAMUSCULAR | Status: DC | PRN
  Administered 2019-05-12: .2 mg via INTRAVENOUS

## 2019-05-12 MED ORDER — GLYCOPYRROLATE PF 0.2 MG/ML IJ SOSY: PREFILLED_SYRINGE | INTRAMUSCULAR | Status: DC | PRN

## 2019-05-12 MED ORDER — MAGNESIUM SULFATE 2 GM/50ML IV SOLN: 2.0000 g | Freq: Every day | INTRAVENOUS | Status: DC | PRN

## 2019-05-12 MED ORDER — CEFAZOLIN SODIUM-DEXTROSE 2-4 GM/100ML-% IV SOLN
2.0000 g | Freq: Three times a day (TID) | INTRAVENOUS | Status: AC
  Administered 2019-05-12 – 2019-05-13 (×2): 2 g via INTRAVENOUS
  Filled 2019-05-12 (×2): qty 100

## 2019-05-12 MED ORDER — MORPHINE SULFATE (PF) 2 MG/ML IV SOLN
2.0000 mg | INTRAVENOUS | Status: DC | PRN
  Administered 2019-05-12 – 2019-05-15 (×8): 2 mg via INTRAVENOUS
  Filled 2019-05-12 (×8): qty 1

## 2019-05-12 MED ORDER — IOHEXOL 300 MG/ML  SOLN
INTRAMUSCULAR | Status: DC | PRN
  Administered 2019-05-12: 15 mL via INTRA_ARTERIAL

## 2019-05-12 MED ORDER — ONDANSETRON HCL 4 MG/2ML IJ SOLN
INTRAMUSCULAR | Status: DC | PRN
  Administered 2019-05-12: 4 mg via INTRAVENOUS

## 2019-05-12 MED ORDER — EPHEDRINE SULFATE 50 MG/ML IJ SOLN
INTRAMUSCULAR | Status: DC | PRN
  Administered 2019-05-12 (×2): 5 mg via INTRAVENOUS

## 2019-05-12 MED ORDER — MIDAZOLAM HCL 5 MG/5ML IJ SOLN
INTRAMUSCULAR | Status: DC | PRN
  Administered 2019-05-12: 1 mg via INTRAVENOUS
  Administered 2019-05-12: 0.5 mg via INTRAVENOUS

## 2019-05-12 MED ORDER — 0.9 % SODIUM CHLORIDE (POUR BTL) OPTIME
TOPICAL | Status: DC | PRN
  Administered 2019-05-12: 08:00:00 2000 mL

## 2019-05-12 MED ORDER — BISACODYL 10 MG RE SUPP: 10.0000 mg | Freq: Every day | RECTAL | Status: DC | PRN

## 2019-05-12 MED ORDER — SODIUM CHLORIDE 0.9 % IV SOLN
INTRAVENOUS | Status: DC | PRN
  Administered 2019-05-12: 500 mL

## 2019-05-12 MED ORDER — ENSURE ENLIVE PO LIQD
237.0000 mL | Freq: Two times a day (BID) | ORAL | Status: DC
  Administered 2019-05-13 – 2019-05-16 (×5): 237 mL via ORAL

## 2019-05-12 SURGICAL SUPPLY — 67 items
BANDAGE ESMARK 6X9 LF (GAUZE/BANDAGES/DRESSINGS) IMPLANT
BNDG ESMARK 6X9 LF (GAUZE/BANDAGES/DRESSINGS)
CANISTER SUCT 3000ML PPV (MISCELLANEOUS) ×3 IMPLANT
CANNULA VESSEL 3MM 2 BLNT TIP (CANNULA) ×6 IMPLANT
CATH EMB LATEX FREE 3FRX80CM (CATHETERS) ×1
CATH EMB LATEX FREE 4FRX80CM (CATHETERS) ×2
CATH EMB LF 3FRX80 (CATHETERS) IMPLANT
CATH EMB LF 4FRX80 (CATHETERS) IMPLANT
CLIP VESOCCLUDE MED 24/CT (CLIP) ×3 IMPLANT
CLIP VESOCCLUDE SM WIDE 24/CT (CLIP) ×3 IMPLANT
COVER WAND RF STERILE (DRAPES) ×3 IMPLANT
CRYO VEIN SAPHENOUS (Tissue) ×3 IMPLANT
CUFF TOURN SGL QUICK 24 (TOURNIQUET CUFF)
CUFF TOURN SGL QUICK 34 (TOURNIQUET CUFF)
CUFF TOURN SGL QUICK 42 (TOURNIQUET CUFF) IMPLANT
CUFF TRNQT CYL 24X4X16.5-23 (TOURNIQUET CUFF) IMPLANT
CUFF TRNQT CYL 34X4.125X (TOURNIQUET CUFF) IMPLANT
DERMABOND ADVANCED (GAUZE/BANDAGES/DRESSINGS) ×2
DERMABOND ADVANCED .7 DNX12 (GAUZE/BANDAGES/DRESSINGS) ×2 IMPLANT
DRAIN HEMOVAC 1/8 X 5 (WOUND CARE) IMPLANT
DRAPE HALF SHEET 40X57 (DRAPES) IMPLANT
DRAPE X-RAY CASS 24X20 (DRAPES) ×1 IMPLANT
ELECT REM PT RETURN 9FT ADLT (ELECTROSURGICAL) ×3
ELECTRODE REM PT RTRN 9FT ADLT (ELECTROSURGICAL) ×2 IMPLANT
EVACUATOR SILICONE 100CC (DRAIN) IMPLANT
GLOVE BIO SURGEON STRL SZ7.5 (GLOVE) ×3 IMPLANT
GLOVE BIOGEL PI IND STRL 6.5 (GLOVE) IMPLANT
GLOVE BIOGEL PI INDICATOR 6.5 (GLOVE) ×7
GLOVE ECLIPSE 7.5 STRL STRAW (GLOVE) ×1 IMPLANT
GLOVE SURG SS PI 6.5 STRL IVOR (GLOVE) ×3 IMPLANT
GLOVE SURG SS PI 7.5 STRL IVOR (GLOVE) ×1 IMPLANT
GOWN STRL REUS W/ TWL LRG LVL3 (GOWN DISPOSABLE) ×6 IMPLANT
GOWN STRL REUS W/TWL LRG LVL3 (GOWN DISPOSABLE) ×3
GRAFT VASC ANGIOGRAFT 71-80 (Tissue) IMPLANT
HEMOSTAT SPONGE AVITENE ULTRA (HEMOSTASIS) IMPLANT
KIT BASIN OR (CUSTOM PROCEDURE TRAY) ×3 IMPLANT
KIT TURNOVER KIT B (KITS) ×3 IMPLANT
LOOP VESSEL MINI RED (MISCELLANEOUS) ×2 IMPLANT
NS IRRIG 1000ML POUR BTL (IV SOLUTION) ×6 IMPLANT
PACK PERIPHERAL VASCULAR (CUSTOM PROCEDURE TRAY) ×3 IMPLANT
PAD ARMBOARD 7.5X6 YLW CONV (MISCELLANEOUS) ×6 IMPLANT
SET COLLECT BLD 21X3/4 12 (NEEDLE) IMPLANT
STAPLER VISISTAT 35W (STAPLE) IMPLANT
STOPCOCK 4 WAY LG BORE MALE ST (IV SETS) IMPLANT
SUT ETHILON 3 0 PS 1 (SUTURE) IMPLANT
SUT PROLENE 5 0 C 1 24 (SUTURE) ×3 IMPLANT
SUT PROLENE 6 0 CC (SUTURE) ×5 IMPLANT
SUT PROLENE 7 0 BV 1 (SUTURE) IMPLANT
SUT PROLENE 7 0 BV1 MDA (SUTURE) IMPLANT
SUT SILK 2 0 SH (SUTURE) ×3 IMPLANT
SUT SILK 3 0 (SUTURE)
SUT SILK 3-0 18XBRD TIE 12 (SUTURE) IMPLANT
SUT VIC AB 2-0 SH 27 (SUTURE) ×2
SUT VIC AB 2-0 SH 27XBRD (SUTURE) ×4 IMPLANT
SUT VIC AB 3-0 SH 27 (SUTURE) ×4
SUT VIC AB 3-0 SH 27X BRD (SUTURE) ×4 IMPLANT
SUT VIC AB 4-0 PS2 18 (SUTURE) ×1 IMPLANT
SUT VICRYL 4-0 PS2 18IN ABS (SUTURE) ×6 IMPLANT
SYR 30ML LL (SYRINGE) ×2 IMPLANT
TAPE UMBILICAL COTTON 1/8X30 (MISCELLANEOUS) IMPLANT
TOWEL GREEN STERILE (TOWEL DISPOSABLE) ×3 IMPLANT
TRAY FOLEY MTR SLVR 16FR STAT (SET/KITS/TRAYS/PACK) ×3 IMPLANT
TUBING CIL FLEX 10 FLL-RA (TUBING) ×1 IMPLANT
TUBING EXTENTION W/L.L. (IV SETS) ×1 IMPLANT
UNDERPAD 30X30 (UNDERPADS AND DIAPERS) ×3 IMPLANT
VEIN 'CRYO SAPHENOUS (Tissue) ×2 IMPLANT
WATER STERILE IRR 1000ML POUR (IV SOLUTION) ×3 IMPLANT

## 2019-05-12 NOTE — Anesthesia Procedure Notes (Signed)
Procedure Name: Intubation Date/Time: 05/12/2019 8:21 AM Performed by: Scheryl Darter, CRNA Pre-anesthesia Checklist: Patient identified, Emergency Drugs available, Suction available and Patient being monitored Patient Re-evaluated:Patient Re-evaluated prior to induction Oxygen Delivery Method: Circle System Utilized Preoxygenation: Pre-oxygenation with 100% oxygen Induction Type: IV induction Ventilation: Mask ventilation without difficulty Laryngoscope Size: Mac and 3 Grade View: Grade I Tube type: Oral Tube size: 7.5 mm Number of attempts: 1 Airway Equipment and Method: Stylet and Oral airway Placement Confirmation: ETT inserted through vocal cords under direct vision,  positive ETCO2 and breath sounds checked- equal and bilateral Secured at: 21 cm Tube secured with: Tape Dental Injury: Teeth and Oropharynx as per pre-operative assessment

## 2019-05-12 NOTE — Progress Notes (Signed)
Left foot warm PT doppler in PACU  Awaiting 4E upstairs  Ruta Hinds, MD [facility] [place] Office: [phone] Pager: [phone]

## 2019-05-12 NOTE — Anesthesia Postprocedure Evaluation (Signed)
Anesthesia Post Note  Patient: JARELY JUNCAJ  Procedure(s) Performed: Lower Extremity Angiogram (Left Leg Upper) LEFT Femoral Endarterectomy (Left ) Left Femoral Patch Angioplasty USING CADAVERIC SAPHENOUS VEIN (Left ) Left Ilio-Femoral Artery and Femoral-popliteal Graft Thrombectomy (Left )     Patient location during evaluation: PACU Anesthesia Type: General Level of consciousness: awake and alert Pain management: pain level controlled Vital Signs Assessment: post-procedure vital signs reviewed and stable Respiratory status: spontaneous breathing, nonlabored ventilation and respiratory function stable Cardiovascular status: blood pressure returned to baseline, stable and bradycardic Postop Assessment: no apparent nausea or vomiting Anesthetic complications: no    Last Vitals:  Vitals:   05/12/19 1300 05/12/19 1315  BP: (!) 174/69 (!) 160/68  Pulse: (!) 52 (!) 50  Resp: 16 15  Temp:    SpO2: 98% 100%    Last Pain:  Vitals:   05/12/19 1315  TempSrc:   PainSc: [place]

## 2019-05-12 NOTE — Interval H&P Note (Signed)
History and Physical Interval Note:  05/12/2019 7:22 AM  Mercedes [name]  has presented today for surgery, with the diagnosis of stenosis is bypass.  The various methods of treatment have been discussed with the patient and family. After consideration of risks, benefits and other options for treatment, the patient has consented to  Procedure(s): [place] (Left) as a surgical intervention.  The patient's history has been reviewed, patient examined, no change in status, stable for surgery.  I have reviewed the patient's chart and labs.  Questions were answered to the patient's satisfaction.     Ruta Hinds

## 2019-05-12 NOTE — Op Note (Signed)
Procedure: Thrombectomy left femoropopliteal bypass patch angioplasty proximal left femoropopliteal and common femoral artery, thrombectomy left external iliac artery, profunda, superficial femoral artery  Preoperative diagnosis: Severe ischemia left foot  Postoperative diagnosis: Same  Anesthesia: General  Assistant: Doreatha Massed, PA-C  Operative findings: Subacute clot left common femoral profunda femoropopliteal and distal external iliac artery  Cadaveric vein patch left common femoral and proximal femoropopliteal bypass  Operative details: After informed consent, patient taken the operating.  The patient's placed supine position operating table.  Induction of general anesthesia endotracheal ovation a Foley catheter was placed.  Next patient's entire left lower extremities prepped and draped in usual sterile fashion.  Longitudinal incision was made through the left groin carried on through subcutaneous tissues along the left common femoral artery.  This was done through pre-existing scar.  There were fairly dense adhesions around the artery and these were taken down fairly tediously.  The pre-existing left femoral profunda femoris superficial femoral and common femoral arteries were all dissected free circumferentially.  Dissection was carried up underneath the inguinal ligament to just above the circumflex iliac branches.  There was a good pulse within the artery at this location.  There was no pulse within the common femoral artery femoropopliteal or profunda or SFA.  From preoperative arteriogram it appeared that she would need a redo to the peroneal.  Therefore I made an additional longitudinal incision on the medial aspect of the left calf. Subcutaneous tissues through the soleus muscle all the way down the level of the peroneal artery.  This was approximately 1 mm diameter vessel.  Vesseloops placed around it.  It was fairly soft on palpation.  A tunnel was then created from the  below-knee space all the way up to the groin.  Patient was then given 6000 is intravenous heparin.  The artery was controlled proximally distally in the groin.  Longitudinal opening was made in the hood of the femoropopliteal bypass and there was subacute clot present.  The arteriotomy was extended all the way up onto the common femoral artery and distal external iliac artery.  There was a weblike stenosis in this area.  The fresh thrombus was direct vision.  I then passed a #3 Fogarty catheter down all the profunda branches and cut his backbleeding.  A #3 and #4 Fogarty was passed all the way down the femoropopliteal bypass with return of some thrombus.  I was able to pass the Fogarty a full 60 cm.  I then passed a 4 Fogarty of the external iliac and multiple passes were made to all thrombus was removed and there was excellent arterial inflow.  Since I had gotten the catheter all the way through the femoropopliteal bypass I decided to patch this proximal aspect and come to do a completion arteriogram to see if the femoropopliteal was open.  Cadaveric vein had already been called so I used a portion of this as a patch angioplasty.  This was done with a running 6-0 Prolene suture.  Despite completion of the anastomosis it was for blood backbled and thoroughly flushed reanastomosed was secured clamps released there is pulsatile flow in the common femoral profunda femoris and femoropopliteal bypass immediately.  There was good Doppler flow in all these vessels as well.  There is also monophasic posterior tibial Doppler signal at this point.  Tunneler was removed.  Completion angiogram was then performed by introducing a 23-gauge butterfly needle in the proximal aspect of the vein graft and with inflow occlusion the angiogram was  obtained.  This showed a patent distal anastomosis of the femoropopliteal with one-vessel runoff via the peroneal.  Patient's Doppler signals continue to improve.  At this point it was removed  from around the peroneal.  All the other Vesseloops were also removed from the groin.  Hemostasis was obtained.  The groin was then closed in multiple layers a running 203 0 Vicryl suture and a 4-0 Vicryl subcuticular stitch in the skin.  The below-knee incision was then closed with a 3-0 Vicryl suture in subcutaneous layer and 4-0 Vicryl subcuticular stitch.  Dermabond was applied to both incisions.  Patient tired procedure well and there were no complications.  Sponge and needle counts correct in the case.  Patient was taken to recovery in stable condition.  We will need a formal lower extremity arteriogram prior to discharge to make sure that she has no evidence of stenosis throughout the remainder of the vein graft and no other obvious evidence that showed the reason for the vein graft to go down.  Mercedes Hinds, MD [facility] Office: [phone] Pager: [phone]

## 2019-05-12 NOTE — Anesthesia Procedure Notes (Addendum)
Arterial Line Insertion Start/End9/14/2020 7:50 AM Performed by: Lavell Luster, CRNA, CRNA  Patient location: Pre-op. Preanesthetic checklist: patient identified, IV checked, site marked, risks and benefits discussed, surgical consent and pre-op evaluation Left, radial was placed Catheter size: 20 G Hand hygiene performed  and maximum sterile barriers used  Allen's test indicative of satisfactory collateral circulation Attempts: 2 Procedure performed without using ultrasound guided technique. Ultrasound Notes:anatomy identified and needle tip was noted to be adjacent to the nerve/plexus identified Following insertion, dressing applied and Biopatch. Post procedure assessment: unchanged  Patient tolerated the procedure well with no immediate complications.

## 2019-05-12 NOTE — Progress Notes (Signed)
[place] for Heparin Indication: ischemic foot    Labs: Recent Labs    05/09/19 1944  05/09/19 2135 05/10/19 0343 05/11/19 0239 05/11/19 1237 05/12/19 0100  HGB 13.5  --   --  12.0 12.0  --  11.1*  HCT 43.0  --   --  38.4 37.9  --  36.2  PLT 308  --   --  337 312  --  324  APTT  --   --   --   --   --   --  90*  LABPROT  --   --  14.0  --   --   --  14.7  INR  --   --  1.1  --   --   --  1.2  HEPARINUNFRC  --    < > 0.39 0.58 0.75* 0.44 0.56  CREATININE 0.97  --   --   --   --   --  0.97   < > = values in this interval not displayed.    Assessment: 43 yof on apixaban PTA for PAF presenting with foot pain now s/p Thrombectomy left femoropopliteal bypass and thrombectomy left external iliac artery today. Pharmacy to restart heparin at 6pm. -Hg= 11.1, plt= 324   Goal of Therapy:  Heparin level 0.3-0.7 units/mL Monitor platelets by anticoagulation protocol: Yes   Plan:  -restart heparin at 650 units/hr at  6p. -Heparin level in 6 hours and daily wth CBC daily  Hildred Laser, PharmD Clinical Pharmacist **Pharmacist phone directory can now be found on [email or web address] ([email or web address]).  Listed under [place].

## 2019-05-12 NOTE — Transfer of Care (Signed)
Immediate Anesthesia Transfer of Care Note  Patient: Mercedes [name]  Procedure(s) Performed: Lower Extremity Angiogram (Left Leg Upper) LEFT Femoral Endarterectomy (Left ) Left Femoral Patch Angioplasty USING CADAVERIC SAPHENOUS VEIN (Left ) Left Ilio-Femoral Artery and Femoral-popliteal Graft Thrombectomy (Left )  Patient Location: PACU  Anesthesia Type:General  Level of Consciousness: awake, alert , oriented and sedated  Airway & Oxygen Therapy: Patient Spontanous Breathing and Patient connected to nasal cannula oxygen  Post-op Assessment: Report given to RN, Post -op Vital signs reviewed and stable and Patient moving all extremities  Post vital signs: Reviewed and stable  Last Vitals:  Vitals Value Taken Time  BP 172/86 05/12/19 1229  Temp    Pulse 97 05/12/19 1233  Resp 17 05/12/19 1233  SpO2 100 % 05/12/19 1233  Vitals shown include unvalidated device data.  Last Pain:  Vitals:   05/12/19 0540  TempSrc:   PainSc: 8       Patients Stated Pain Goal: 2 (45/99/77 4142)  Complications: No apparent anesthesia complications

## 2019-05-13 ENCOUNTER — Encounter (HOSPITAL_COMMUNITY): Payer: Self-pay | Admitting: Vascular Surgery

## 2019-05-13 ENCOUNTER — Encounter (HOSPITAL_COMMUNITY): Payer: Medicare Other

## 2019-05-13 LAB — CBC
HCT: 31.1 % — ABNORMAL LOW (ref 36.0–46.0)
Hemoglobin: 10.2 g/dL — ABNORMAL LOW (ref 12.0–15.0)
MCH: 25.4 pg — ABNORMAL LOW (ref 26.0–34.0)
MCHC: 32.8 g/dL (ref 30.0–36.0)
MCV: 77.6 fL — ABNORMAL LOW (ref 80.0–100.0)
Platelets: 297 10*3/uL (ref 150–400)
RBC: 4.01 MIL/uL (ref 3.87–5.11)
RDW: 15.9 % — ABNORMAL HIGH (ref 11.5–15.5)
WBC: 6.8 10*3/uL (ref 4.0–10.5)
nRBC: 0 % (ref 0.0–0.2)

## 2019-05-13 LAB — BASIC METABOLIC PANEL
Anion gap: 10 (ref 5–15)
BUN: 11 mg/dL (ref 8–23)
CO2: 22 mmol/L (ref 22–32)
Calcium: 8.4 mg/dL — ABNORMAL LOW (ref 8.9–10.3)
Chloride: 102 mmol/L (ref 98–111)
Creatinine, Ser: 0.89 mg/dL (ref 0.44–1.00)
GFR calc Af Amer: >60 mL/min (ref >60)
GFR calc non Af Amer: >60 mL/min (ref >60)
Glucose, Bld: 101 mg/dL — ABNORMAL HIGH (ref 70–99)
Potassium: 4.3 mmol/L (ref 3.5–5.1)
Sodium: 134 mmol/L — ABNORMAL LOW (ref 135–145)

## 2019-05-13 LAB — HEPARIN LEVEL (UNFRACTIONATED)
Heparin Unfractionated: 0.38 IU/mL (ref 0.30–0.70)
Heparin Unfractionated: 0.35 IU/mL (ref 0.30–0.70)

## 2019-05-13 MED ORDER — ADULT MULTIVITAMIN W/MINERALS CH
1.0000 | ORAL_TABLET | Freq: Every day | ORAL | Status: DC
  Administered 2019-05-13 – 2019-05-16 (×3): 1 via ORAL
  Filled 2019-05-13 (×4): qty 1

## 2019-05-13 NOTE — Progress Notes (Signed)
RN entered pt room responding to alarming IV pump.  Fluid volume was increased on pump to silence it.  Pt states she has no further needs or concerns at this time. Call bell within reach. Will continue to monitor.

## 2019-05-13 NOTE — Progress Notes (Signed)
Initial Nutrition Assessment  DOCUMENTATION CODES:   Severe malnutrition in context of acute illness/injury, Underweight  INTERVENTION:    Ensure Enlive po BID, each supplement provides 350 kcal and 20 grams of protein  MVI daily  NUTRITION DIAGNOSIS:   Severe Malnutrition related to acute illness(PAD with LLE ischemia) as evidenced by energy intake < or equal to 50% for > or equal to 5 days, moderate fat depletion, severe muscle depletion, percent weight loss.  GOAL:   Patient will meet greater than or equal to 90% of their needs  MONITOR:   PO intake, Supplement acceptance, Weight trends, Skin, I & O's, Labs  REASON FOR ASSESSMENT:   Malnutrition Screening Tool    ASSESSMENT:   Patient with PMH significant for CAD, STEMI s/p stenting, GER, HLD, HTN, PAD s/p L fem pop, PPM, and takotsubo cardiomyopathy. Presents this admission with LLE ischemia.    9/14- L fempop bypass, thrombectomy L iliac/femoral artery  Spoke with pt at bedside. Reports appetite declined slightly a month PTA. She continued to eat her typically 2-3 meals daily but portions decreased. Meal consisted of vegetables she grew from her garden (string beans, tomatoes, squash), potatoes, grains, and chicken/fish. Reports her appetite has picked up s/p surgery. Meal completion charted as 100% for her last meal. Discussed the importance of protein intake for preservation of lean body mass and to promote wound healing. She amendable to continuing Ensure this admission and at home.   Pt endorses a UBW of 123-125 lb and a recent wt loss of 30 lb in the last three weeks. Records indicate pt weighed 121 lb on 3/10 and 108 lb this admission (10.7% wt loss in 6 months, significant for time frame)   I/O: +1,016 ml since admit UOP: 1,400 ml x 24 hrs    Drips: NS @ 50 ml/hr Medications: colace Labs: Na 134 (L)   NUTRITION - FOCUSED PHYSICAL EXAM:    Most Recent Value  Orbital Region  No depletion  Upper Arm  Region  Moderate depletion  Thoracic and Lumbar Region  Moderate depletion  Buccal Region  Mild depletion  Temple Region  Mild depletion  Clavicle Bone Region  Severe depletion  Clavicle and Acromion Bone Region  Severe depletion  Scapular Bone Region  Moderate depletion  Dorsal Hand  Moderate depletion  Patellar Region  Severe depletion  Anterior Thigh Region  Severe depletion  Posterior Calf Region  Severe depletion  Edema (RD Assessment)  None  Hair  Reviewed  Eyes  Reviewed  Mouth  Reviewed  Skin  Reviewed  Nails  Reviewed     Diet Order:   Diet Order            Diet Heart Room service appropriate? Yes; Fluid consistency: Thin  Diet effective now              EDUCATION NEEDS:   Education needs have been addressed  Skin:  Skin Assessment: Skin Integrity Issues: Skin Integrity Issues:: Incisions Incisions: L groin/L leg  Last BM:  9/10  Height:   Ht Readings from Last 1 Encounters:  05/09/19 5' 5.5" (1.664 m)    Weight:   Wt Readings from Last 1 Encounters:  05/09/19 49.3 kg    Ideal Body Weight:  56.8 kg  BMI:  Body mass index is 17.81 kg/m.  Estimated Nutritional Needs:   Kcal:  1500-1700 kcal  Protein:  75-90 grams  Fluid:  >/= 1.5 L/day   Mariana Single RD, LDN Clinical Nutrition Pager # -  [phone]  

## 2019-05-13 NOTE — Progress Notes (Signed)
Nursing student offered toileting. Pt agreed to attempt to void. Pt transitioned from chair to [facility] with standby assist. Pt successfully voided with no issues observed or reported. Pt transferred from [facility] to bed with standby assist. Pts wires and IV tubing adjusted to make the pt comfortable. Linens changed during the time pt was on [facility] RN aware.

## 2019-05-13 NOTE — Progress Notes (Signed)
Occupational Therapy Evaluation Patient Details Name: Mercedes [name] MRN: [id number] DOB: June 29, 1956 Today's Date: 05/13/2019    History of Present Illness 63 y.o. female admitted with LLE pain now s/p redo left fem to peroneal BPG. PMHx: HTN, HLD, CAD, CABG, anxiety, sjogren's and PAD with left femoral to below the knee popliteal BPG with great toe amputation by Dr. Oneida Alar in 2015   Clinical Impression   PTA, pt was living at home alone, and reports she was independent with ADL/IADL and functional mobility without AD until a couple weeks ago and has been requiring assistance from family due to inability to walk on LLE. Pt currently requires minA for LB ADL and steup-minguard for UB ADL. Pt unable to bear weight through LLE, reliant on BUE support on walker and minA for stand-pivot transfer from EOB to recliner. Pt unable to tolerate further mobility. Pt reports she does not feel safe enough to go home at this point, recommend SNF level therapy prior to returning home. However, should pt progress to d/c home, recommend [place]. Will continue to follow acutely.     Follow Up Recommendations  SNF    Equipment Recommendations  3 in 1 bedside commode    Recommendations for Other Services       Precautions / Restrictions Precautions Precautions: Fall Restrictions Weight Bearing Restrictions: No      Mobility Bed Mobility Overal bed mobility: Needs Assistance Bed Mobility: Supine to Sit     Supine to sit: Supervision     General bed mobility comments: supervision for lines, increased time,  Transfers Overall transfer level: Needs assistance Equipment used: Rolling walker (2 wheeled) Transfers: Sit to/from [facility] Sit to Stand: Min assist Stand pivot transfers: Min assist       General transfer comment: minA for safe hand placement, powerup and stability during pivot to recliner; pt maintained NWB LLE due to pain     Balance Overall balance assessment:  Needs assistance Sitting-balance support: Single extremity supported;Feet unsupported Sitting balance-Leahy Scale: Fair Sitting balance - Comments: able to don sock sitting EOB   Standing balance support: Bilateral upper extremity supported;During functional activity Standing balance-Leahy Scale: Fair Standing balance comment: required BUE support in standing;unable to bear weight through LLE                            ADL either performed or assessed with clinical judgement   ADL Overall ADL's : Needs assistance/impaired Eating/Feeding: Set up;Sitting   Grooming: Set up;Sitting   Upper Body Bathing: Min guard;Sitting   Lower Body Bathing: Minimal assistance;Sit to/from stand   Upper Body Dressing : Min guard;Sitting   Lower Body Dressing: Minimal assistance;Sit to/from stand Lower Body Dressing Details (indicate cue type and reason): donned socks sitting EOb Toilet Transfer: Minimal assistance;Stand-pivot;RW Toilet Transfer Details (indicate cue type and reason): simulated stand pivot from EOB to recliner Toileting- Clothing Manipulation and Hygiene: Minimal assistance;Sit to/from stand       Functional mobility during ADLs: Minimal assistance;Rolling walker General ADL Comments: minA for safety and stability;pt unable to bear weight through LLE     Vision         Perception     Praxis      Pertinent Vitals/Pain Pain Assessment: 0-10 Pain Score: 8  Pain Location: groin incision site and Left foot Pain Descriptors / Indicators: Aching;Burning Pain Intervention(s): Limited activity within patient's tolerance;Monitored during session     Hand Dominance Right  Extremity/Trunk Assessment Upper Extremity Assessment Upper Extremity Assessment: Overall WFL for tasks assessed   Lower Extremity Assessment Lower Extremity Assessment: LLE deficits/detail LLE Deficits / Details: decreased ROM and strength due to pain LLE: Unable to fully assess due to  pain   Cervical / Trunk Assessment Cervical / Trunk Assessment: Normal   Communication Communication Communication: No difficulties   Cognition Arousal/Alertness: Awake/alert Behavior During Therapy: WFL for tasks assessed/performed Overall Cognitive Status: Within Functional Limits for tasks assessed                                 General Comments: good safety awareness, verbalized not feeling safe to go home at this point and agreeable to SNF    General Comments  vital signs within normal range;educated pt on progressive muscle relaxation for pain management;pt expressed preference for not relying on pain medications     Exercises     Shoulder Instructions      Home Living Family/patient expects to be discharged to:: Private residence Living Arrangements: Alone Available Help at Discharge: Family;Available PRN/intermittently Type of Home: House Home Access: Stairs to enter [employer] of Steps: 4 Entrance Stairs-Rails: None Home Layout: One level     Bathroom Shower/Tub: Chief Strategy Officer: Standard Bathroom Accessibility: Yes How Accessible: Accessible via walker Home Equipment: Walker - 2 wheels   Additional Comments: RW borrowed from friend and too large      Prior Functioning/Environment Level of Independence: Independent        Comments: normally independent, assist for housework and bathing for last 2 weeks due to pain        OT Problem List: Decreased strength;Decreased range of motion;Decreased activity tolerance;Impaired balance (sitting and/or standing);Decreased safety awareness;Decreased knowledge of use of DME or AE;Decreased knowledge of precautions;Pain      OT Treatment/Interventions: Self-care/ADL training;Therapeutic exercise;Energy conservation;DME and/or AE instruction;Therapeutic activities;Patient/family education;Balance training    OT Goals(Current goals can be found in the care plan section)  Acute Rehab OT Goals Patient Stated Goal: return home OT Goal Formulation: With patient Potential to Achieve Goals: Good ADL Goals Pt Will Perform Grooming: with modified independence Pt Will Perform Upper Body Dressing: with modified independence Pt Will Perform Lower Body Dressing: with modified independence;sit to/from stand Pt Will Transfer to Toilet: with modified independence;ambulating Pt Will Perform Toileting - Clothing Manipulation and hygiene: with modified independence;sit to/from stand  OT Frequency: Min 2X/week   Barriers to D/C: Decreased caregiver support  pt lives alone       Co-evaluation              AM-PAC OT "6 Clicks" Daily Activity     Outcome Measure Help from another person eating meals?: None Help from another person taking care of personal grooming?: A Little Help from another person toileting, which includes using toliet, bedpan, or urinal?: A Little Help from another person bathing (including washing, rinsing, drying)?: A Little Help from another person to put on and taking off regular upper body clothing?: A Little Help from another person to put on and taking off regular lower body clothing?: A Little 6 Click Score: 19   End of Session Equipment Utilized During Treatment: Gait belt;Rolling walker Nurse Communication: Mobility status  Activity Tolerance: Patient tolerated treatment well;Patient limited by pain Patient left: in chair;with call bell/phone within reach  OT Visit Diagnosis: Unsteadiness on feet (R26.81);Other abnormalities of gait and mobility (R26.89);Muscle weakness (generalized) (  M62.81);Pain Pain - Right/Left: Left Pain - part of body: Leg;Ankle and joints of foot                Time: 1320-1345 OT Time Calculation (min): 25 min Charges:  OT General Charges $OT Visit: 1 Visit OT Evaluation $OT Eval Moderate Complexity: 1 Mod OT Treatments $Self Care/Home Management : 8-22 mins  Diona Browner OTR/L Acute  Rehabilitation Services Office: [phone]   Rebeca Alert 05/13/2019, 1:55 PM

## 2019-05-13 NOTE — Evaluation (Signed)
Physical Therapy Evaluation Patient Details Name: Mercedes [name] MRN: [id number] DOB: 11-21-1955 Today's Date: 05/13/2019   History of Present Illness  63 y.o. female admitted with LLE pain now s/p redo left fem to peroneal BPG. PMHx: HTN, HLD, CAD, CABG, anxiety, sjogren's and PAD with left femoral to below the knee popliteal BPG with great toe amputation by Dr. Oneida Alar in 2015  Clinical Impression  Pt very nice and willing to mobilize. Pt with left groin and foot pain limiting all mobility. Pt reports normally being independent without AD until a couple weeks ago and since that time has required assist from family with inability to walk on LLE. Pt with pain in LLE mostly groin at incision and foot limiting her tolerance for standing with LLE dependent and unable to progress to gait this session. Pt with decreased transfers, strength, gait and mobility who will benefit from acute therapy to maximize function and independence to decrease burden of care. Pt educated for HEP and positioning with knee extended and encouraged to continue and progress throughout the day.      Follow Up Recommendations Home health PT    Equipment Recommendations  Rolling walker with 5" wheels;3in1 (PT)    Recommendations for Other Services       Precautions / Restrictions Precautions Precautions: Fall Restrictions Weight Bearing Restrictions: No      Mobility  Bed Mobility Overal bed mobility: Needs Assistance Bed Mobility: Supine to Sit     Supine to sit: Supervision     General bed mobility comments: supervision for lines, increased time, bed flat use of rail  Transfers Overall transfer level: Needs assistance   Transfers: Sit to/from Stand Sit to Stand: Min guard         General transfer comment: guarding with cues for safety and hand placement. Pt stood x 2 from bed and x 2 from chair. Able to stand pivot with RW maintaining NWB LLE due to pain. Unable to progress to  pain  Ambulation/Gait             General Gait Details: unable  Stairs            Wheelchair Mobility    Modified Rankin (Stroke Patients Only)       Balance Overall balance assessment: Mild deficits observed, not formally tested                                           Pertinent Vitals/Pain Pain Assessment: 0-10 Pain Score: 5  Pain Location: LLE thigh aching and foot burning Pain Descriptors / Indicators: Aching;Burning Pain Intervention(s): Limited activity within patient's tolerance;Monitored during session;Repositioned;Patient requesting pain meds-RN notified    Home Living Family/patient expects to be discharged to:: Private residence Living Arrangements: Alone Available Help at Discharge: Family;Available PRN/intermittently Type of Home: House Home Access: Stairs to enter Entrance Stairs-Rails: None Entrance Stairs-Number of Steps: 4 Home Layout: One level Home Equipment: Environmental consultant - 2 wheels Additional Comments: RW borrowed from friend and too large    Prior Function Level of Independence: Independent         Comments: normally independent, assist for housework and bathing for last 2 weeks due to pain     Hand Dominance        Extremity/Trunk Assessment   Upper Extremity Assessment Upper Extremity Assessment: Overall WFL for tasks assessed    Lower Extremity Assessment Lower Extremity  Assessment: LLE deficits/detail LLE Deficits / Details: decreased ROM and strength due to pain LLE: Unable to fully assess due to pain    Cervical / Trunk Assessment Cervical / Trunk Assessment: Normal  Communication   Communication: No difficulties  Cognition Arousal/Alertness: Awake/alert Behavior During Therapy: WFL for tasks assessed/performed Overall Cognitive Status: Within Functional Limits for tasks assessed                                        General Comments      Exercises General Exercises - Lower  Extremity Ankle Circles/Pumps: AAROM;Left;Seated;10 reps Long Arc Quad: AROM;Left;Seated;10 reps Hip Flexion/Marching: AROM;Left;Seated;10 reps   Assessment/Plan    PT Assessment Patient needs continued PT services  PT Problem List Decreased strength;Decreased mobility;Decreased range of motion;Decreased activity tolerance;Decreased knowledge of use of DME;Pain       PT Treatment Interventions Gait training;Therapeutic exercise;Patient/family education;Therapeutic activities;DME instruction;Stair training;Balance training;Functional mobility training    PT Goals (Current goals can be found in the Care Plan section)  Acute Rehab PT Goals Patient Stated Goal: return home PT Goal Formulation: With patient Time For Goal Achievement: 05/27/19 Potential to Achieve Goals: Good    Frequency Min 3X/week   Barriers to discharge Decreased caregiver support      Co-evaluation               AM-PAC PT "6 Clicks" Mobility  Outcome Measure Help needed turning from your back to your side while in a flat bed without using bedrails?: A Little Help needed moving from lying on your back to sitting on the side of a flat bed without using bedrails?: A Little Help needed moving to and from a bed to a chair (including a wheelchair)?: A Little Help needed standing up from a chair using your arms (e.g., wheelchair or bedside chair)?: A Little Help needed to walk in hospital room?: A Little Help needed climbing 3-5 steps with a railing? : A Little 6 Click Score: 18    End of Session   Activity Tolerance: Patient tolerated treatment well Patient left: in chair;with call bell/phone within reach Nurse Communication: Mobility status PT Visit Diagnosis: Other abnormalities of gait and mobility (R26.89);Pain Pain - Right/Left: Left Pain - part of body: Leg    Time: 0730-0755 PT Time Calculation (min) (ACUTE ONLY): 25 min   Charges:   PT Evaluation $PT Eval Moderate Complexity: 1 Mod PT  Treatments $Therapeutic Activity: 8-22 mins        Mitsugi Schrader Abner Greenspan, PT Acute Rehabilitation Services Pager: [phone] Office: [phone]   Tamiko Leopard B Sharnese Heath 05/13/2019, 7:58 AM

## 2019-05-13 NOTE — Progress Notes (Addendum)
[facility]  Subjective  - Doing much better.   Objective (!) 99/58 (!) 51 98 F (36.7 C) (Oral) 16 98%  Intake/Output Summary (Last 24 hours) at 05/13/2019 0713 Last data filed at 05/13/2019 0522 Gross per 24 hour  Intake 3760.91 ml  Output 1600 ml  Net 2160.91 ml    Doppler signals PT/DP/peroneal left LE, foot warm to touch.  Primary run off is peroneal. Groin soft without hematoma Lower leg incision dressing clean and dry Lungs non labored breathing   Assessment/Planning: POD # 1  Thrombectomy left femoropopliteal bypass patch angioplasty proximal left femoropopliteal and common femoral artery, thrombectomy left external iliac artery, profunda, superficial femoral artery  Regained inline flow via left peroneal after thrombectomy and patch angioplasty of femoral bypass.  Plan for formal angiogram with LE runoff pending scheduling.   PT/OT eval and treatment pending ABI pending.   Roxy Horseman 05/13/2019 7:13 AM --  Agree with above.  Diagnostic agram by Dr Donzetta Matters on Thursday if negative then d/c home on Friday. Ambulate today  Ruta Hinds, MD [facility] Office: [phone] Pager: [phone]   Laboratory Lab Results: Recent Labs    05/12/19 0100 05/13/19 0500  WBC 5.0 6.8  HGB 11.1* 10.2*  HCT 36.2 31.1*  PLT 324 297   BMET Recent Labs    05/12/19 0100 05/13/19 0500  NA 135 134*  K 4.1 4.3  CL 106 102  CO2 23 22  GLUCOSE 101* 101*  BUN 8 11  CREATININE 0.97 0.89  CALCIUM 8.6* 8.4*    COAG Lab Results  Component Value Date   INR 1.2 05/12/2019   INR 1.1 05/09/2019   INR 1.0 05/09/2019   No results found for: PTT

## 2019-05-13 NOTE — Progress Notes (Signed)
[facility] for Heparin Indication: ischemic foot    Labs: Recent Labs    05/10/19 0343 05/11/19 0239 05/11/19 1237 05/12/19 0100 05/13/19 0044  HGB 12.0 12.0  --  11.1*  --   HCT 38.4 37.9  --  36.2  --   PLT 337 312  --  324  --   APTT  --   --   --  90*  --   LABPROT  --   --   --  14.7  --   INR  --   --   --  1.2  --   HEPARINUNFRC 0.58 0.75* 0.44 0.56 0.38  CREATININE  --   --   --  0.97  --     Assessment: 21 yof on apixaban PTA for PAF presenting with foot pain now s/p Thrombectomy left femoropopliteal bypass and thrombectomy left external iliac artery today. Pharmacy to restart heparin at 6pm. -Hg= 11.1, plt= 324  9/15 AM update: Heparin level therapeutic x 1 after re-start    Goal of Therapy:  Heparin level 0.3-0.7 units/mL Monitor platelets by anticoagulation protocol: Yes   Plan:  Cont heparin at 650 units/hr Confirmatory heparin level with AM labs  Narda Bonds, PharmD, [place] Pharmacist Phone: [phone]

## 2019-05-13 NOTE — Progress Notes (Signed)
Report received from Nicanor Bake, RN

## 2019-05-13 NOTE — Progress Notes (Signed)
ANTICOAGULATION CONSULT NOTE - Follow Up Consult  Pharmacy Consult for Heparin Indication: ischemic foot s/p thrombectomy  Allergies  Allergen Reactions  . Brilinta [Ticagrelor] Anaphylaxis and Other (See Comments)    Also, chest tightness  . Latex Rash    Patient Measurements: Height: 5' 5.5" (166.4 cm) Weight: 108 lb 11 oz (49.3 kg) IBW/kg (Calculated) : 58.15 Heparin Dosing Weight: 49.3 kg  Vital Signs: Temp: 98 F (36.7 C) (09/15 0434) Temp Source: Oral (09/15 0434) BP: 125/57 (09/15 0821) Pulse Rate: 57 (09/15 0821)  Labs: Recent Labs    05/11/19 0239  05/12/19 0100 05/13/19 0044 05/13/19 0500  HGB 12.0  --  11.1*  --  10.2*  HCT 37.9  --  36.2  --  31.1*  PLT 312  --  324  --  297  APTT  --   --  90*  --   --   LABPROT  --   --  14.7  --   --   INR  --   --  1.2  --   --   HEPARINUNFRC 0.75*   < > 0.56 0.38 0.35  CREATININE  --   --  0.97  --  0.89   < > = values in this interval not displayed.    Estimated Creatinine Clearance: 49 mL/min (by C-G formula based on SCr of 0.89 mg/dL).  Assessment:  Anticoag: PTA apixaban (LD unknown). Heparin for ischemic foot. Hep level 0.35 in goal. Hgb 11.1>10.2. Plts 297.  - 9/15: Thrombectomy left femoropopliteal bypass patch angioplasty proximal left femoropopliteal and common femoral artery, thrombectomy left external iliac artery, profunda, superficial femoral artery   Goal of Therapy:  Heparin level 0.3-0.7 units/ml Monitor platelets by anticoagulation protocol: Yes   Plan:  Continue heparin to 650 units/hr Daily heparin level/CBC  Analys Ryden S. Alford Highland, PharmD, BCPS Clinical Staff Pharmacist Eilene Ghazi Stillinger 05/13/2019,9:19 AM

## 2019-05-13 NOTE — Progress Notes (Signed)
Student RN entered room to encourage pt to attempt to void. Pt reports she is unable to void at this time. Due to pts last BM being on 9/10, Student RN offered MiraLAX and/or Ensure to the pt (see MAR). Pt refused MiraLAX, but agreed to Ensure. Pt given Ensure and water at this time. RN aware.

## 2019-05-13 NOTE — Progress Notes (Signed)
RN entered pt room to assess cardiac leadset.  Pt c/o wires being twisted next to her char.  RN straightened out IV and heart monitor wires as well as explained the necessity of these wires for her care.  Will continue to monitor.

## 2019-05-13 NOTE — Progress Notes (Signed)
Pt was assessed after being helped to chair by PT.  Pt complained of leg/foot postsurgical pain and pain medication was administered as well as ice pack placed.  Pt expressing all needs met, eating breakfast w/call light within reach upon RN leaving room. Will continue to monitor.

## 2019-05-14 ENCOUNTER — Inpatient Hospital Stay (HOSPITAL_COMMUNITY): Payer: Medicare Other

## 2019-05-14 DIAGNOSIS — I739 Peripheral vascular disease, unspecified: Secondary | ICD-10-CM

## 2019-05-14 LAB — CBC
HCT: 28.7 % — ABNORMAL LOW (ref 36.0–46.0)
Hemoglobin: 9.5 g/dL — ABNORMAL LOW (ref 12.0–15.0)
MCH: 25.5 pg — ABNORMAL LOW (ref 26.0–34.0)
MCHC: 33.1 g/dL (ref 30.0–36.0)
MCV: 77.2 fL — ABNORMAL LOW (ref 80.0–100.0)
Platelets: 304 10*3/uL (ref 150–400)
RBC: 3.72 MIL/uL — ABNORMAL LOW (ref 3.87–5.11)
RDW: 15.9 % — ABNORMAL HIGH (ref 11.5–15.5)
WBC: 7.4 10*3/uL (ref 4.0–10.5)
nRBC: 0 % (ref 0.0–0.2)

## 2019-05-14 LAB — HEPARIN LEVEL (UNFRACTIONATED)
Heparin Unfractionated: 0.23 IU/mL — ABNORMAL LOW (ref 0.30–0.70)
Heparin Unfractionated: 0.42 IU/mL (ref 0.30–0.70)

## 2019-05-14 NOTE — Progress Notes (Signed)
ANTICOAGULATION CONSULT NOTE - Follow Up Consult  Pharmacy Consult for Heparin Indication: ischemic foot s/p thrombectomy  Allergies  Allergen Reactions  . Brilinta [Ticagrelor] Anaphylaxis and Other (See Comments)    Also, chest tightness  . Latex Rash    Patient Measurements: Height: 5' 5.5" (166.4 cm) Weight: 108 lb 11 oz (49.3 kg) IBW/kg (Calculated) : 58.15 Heparin Dosing Weight: 49.3 kg  Vital Signs: Temp: 98.1 F (36.7 C) (09/16 0914) Temp Source: Oral (09/16 0914) BP: 124/61 (09/16 0914) Pulse Rate: 70 (09/16 0914)  Labs: Recent Labs    05/12/19 0100  05/13/19 0500 05/14/19 0305 05/14/19 1002  HGB 11.1*  --  10.2* 9.5*  --   HCT 36.2  --  31.1* 28.7*  --   PLT 324  --  297 304  --   APTT 90*  --   --   --   --   LABPROT 14.7  --   --   --   --   INR 1.2  --   --   --   --   HEPARINUNFRC 0.56   < > 0.35 0.23* 0.42  CREATININE 0.97  --  0.89  --   --    < > = values in this interval not displayed.    Estimated Creatinine Clearance: 49 mL/min (by C-G formula based on SCr of 0.89 mg/dL).  Assessment:    Anticoag: PTA apixaban (LD unknown). Heparin for ischemic foot. Hep level 0.42. Hgb 11.1>10.2>9.5. Plts WNL and stable.   - 9/15: Thrombectomy left femoropopliteal bypass patch angioplasty proximal left femoropopliteal and common femoral artery, thrombectomy left external iliac artery, profunda, superficial femoral artery   Goal of Therapy:  Heparin level 0.3-0.7 units/ml Monitor platelets by anticoagulation protocol: Yes   Plan:  IV heparin 750 units/hr Daily heparin level/CBC Plan for formal angiogram tomorrow  Jveon Pound S. Alford Highland, PharmD, BCPS Clinical Staff Pharmacist Eilene Ghazi Stillinger 05/14/2019,11:26 AM

## 2019-05-14 NOTE — Progress Notes (Signed)
ANTICOAGULATION CONSULT NOTE - Follow Up Consult  Pharmacy Consult for Heparin Indication: ischemic foot s/p thrombectomy  Allergies  Allergen Reactions  . Brilinta [Ticagrelor] Anaphylaxis and Other (See Comments)    Also, chest tightness  . Latex Rash    Patient Measurements: Height: 5' 5.5" (166.4 cm) Weight: 108 lb 11 oz (49.3 kg) IBW/kg (Calculated) : 58.15 Heparin Dosing Weight: 49.3 kg  Vital Signs: Temp: 99.1 F (37.3 C) (09/15 2359) Temp Source: Oral (09/15 2359) BP: 122/64 (09/15 2359) Pulse Rate: 61 (09/15 2359)  Labs: Recent Labs    05/12/19 0100 05/13/19 0044 05/13/19 0500 05/14/19 0305  HGB 11.1*  --  10.2* 9.5*  HCT 36.2  --  31.1* 28.7*  PLT 324  --  297 304  APTT 90*  --   --   --   LABPROT 14.7  --   --   --   INR 1.2  --   --   --   HEPARINUNFRC 0.56 0.38 0.35 0.23*  CREATININE 0.97  --  0.89  --     Estimated Creatinine Clearance: 49 mL/min (by C-G formula based on SCr of 0.89 mg/dL).  Assessment:  Anticoag: PTA apixaban (LD unknown). Heparin for ischemic foot. Heparin level 0.35 in goal. Hgb 11.1>10.2. Plts 297.  9/15: Thrombectomy left femoropopliteal bypass patch angioplasty proximal left femoropopliteal and common femoral artery, thrombectomy left external iliac artery, profunda, superficial femoral artery  Heparin level down to subtherapeutic (0.23) this morning. No issues with line or bleeding reported per RN.   Goal of Therapy:  Heparin level 0.3-0.7 units/ml Monitor platelets by anticoagulation protocol: Yes   Plan:  Increase heparin to 750 units/hr F/u 6 hr heparin level  Sherlon Handing, PharmD, BCPS 05/14/2019,3:55 AM

## 2019-05-14 NOTE — Progress Notes (Addendum)
  Progress Note    05/14/2019 7:49 AM 2 Days Post-Op  Subjective:  Says her left foot feels better after surgery.  Tm 100.5 HR 89'F-81'O NSR 175'Z systolic 02% RA  Vitals:   05/13/19 2359 05/14/19 0435  BP: 122/64 129/66  Pulse: 61   Resp: 19 18  Temp: 99.1 F (37.3 C) (!) 100.5 F (38.1 C)  SpO2: 93% 96%    Physical Exam: Cardiac:  regular Lungs:  Non labored Incisions:  Left groin and left BK incisions look good Extremities:  Brisk doppler flow left foot   CBC    Component Value Date/Time   WBC 7.4 05/14/2019 0305   RBC 3.72 (L) 05/14/2019 0305   HGB 9.5 (L) 05/14/2019 0305   HGB 14.2 10/09/2018 1125   HCT 28.7 (L) 05/14/2019 0305   HCT 44.6 10/09/2018 1125   PLT 304 05/14/2019 0305   PLT 289 10/09/2018 1125   MCV 77.2 (L) 05/14/2019 0305   MCV 79 10/09/2018 1125   MCH 25.5 (L) 05/14/2019 0305   MCHC 33.1 05/14/2019 0305   RDW 15.9 (H) 05/14/2019 0305   RDW 14.8 10/09/2018 1125   LYMPHSABS 2.1 05/09/2019 1121   MONOABS 0.5 05/09/2019 1121   EOSABS 0.1 05/09/2019 1121   BASOSABS 0.1 05/09/2019 1121    BMET    Component Value Date/Time   NA 134 (L) 05/13/2019 0500   NA 135 10/09/2018 1125   K 4.3 05/13/2019 0500   CL 102 05/13/2019 0500   CO2 22 05/13/2019 0500   GLUCOSE 101 (H) 05/13/2019 0500   BUN 11 05/13/2019 0500   BUN 10 10/09/2018 1125   CREATININE 0.89 05/13/2019 0500   CREATININE 1.13 (H) 08/03/2016 1440   CALCIUM 8.4 (L) 05/13/2019 0500   GFRNONAA >60 05/13/2019 0500   GFRAA >60 05/13/2019 0500    INR    Component Value Date/Time   INR 1.2 05/12/2019 0100     Intake/Output Summary (Last 24 hours) at 05/14/2019 0749 Last data filed at 05/14/2019 0327 Gross per 24 hour  Intake 1548.19 ml  Output 100 ml  Net 1448.19 ml     Assessment:  63 y.o. female is s/p:  Thrombectomy left femoropopliteal bypass patch angioplasty proximal left femoropopliteal and common femoral artery, thrombectomy left external iliac artery,  profunda, superficial femoral artery  2 Days Post-Op  Plan: -pt doing well with brisk doppler signals left foot.  Plan for formal angiogram tomorrow.   -npo after MN and consent. -DVT prophylaxis:  Heparin gtt   Leontine Locket, PA-C [facility] [phone] 05/14/2019 7:49 AM  Agree with above.  Angio tomorrow by Dr Donzetta Matters.   NPO p midnight  Consent Most likely home Friday  Shenica Holzheimer, MD [facility] [place] Office: [phone] Pager: [phone]

## 2019-05-14 NOTE — Care Management Important Message (Signed)
Important Message  Patient Details  Name: Mercedes [name] MRN: [id number] Date of Birth: 05-31-1956   Medicare Important Message Given:  Yes     Shelda Altes 05/14/2019, 1:59 PM

## 2019-05-14 NOTE — Progress Notes (Signed)
ABI has been completed.   Preliminary results in CV Proc.   Abram Sander 05/14/2019 2:36 PM

## 2019-05-15 ENCOUNTER — Encounter (HOSPITAL_COMMUNITY): Payer: Self-pay | Admitting: Surgery

## 2019-05-15 ENCOUNTER — Ambulatory Visit (HOSPITAL_COMMUNITY): Admission: RE | Admit: 2019-05-15 | Payer: Medicare Other | Source: Home / Self Care | Admitting: Vascular Surgery

## 2019-05-15 ENCOUNTER — Encounter (HOSPITAL_COMMUNITY)
Admission: EM | Disposition: A | Discharge status: Home Health | Payer: Self-pay | Source: Home / Self Care | Attending: Vascular Surgery

## 2019-05-15 DIAGNOSIS — E43 Unspecified severe protein-calorie malnutrition: Secondary | ICD-10-CM | POA: Insufficient documentation

## 2019-05-15 HISTORY — PX: ABDOMINAL AORTOGRAM W/LOWER EXTREMITY: CATH118223

## 2019-05-15 LAB — BASIC METABOLIC PANEL
Anion gap: 8 (ref 5–15)
BUN: 9 mg/dL (ref 8–23)
CO2: 23 mmol/L (ref 22–32)
Calcium: 8.4 mg/dL — ABNORMAL LOW (ref 8.9–10.3)
Chloride: 104 mmol/L (ref 98–111)
Creatinine, Ser: 0.8 mg/dL (ref 0.44–1.00)
GFR calc Af Amer: >60 mL/min (ref >60)
GFR calc non Af Amer: >60 mL/min (ref >60)
Glucose, Bld: 111 mg/dL — ABNORMAL HIGH (ref 70–99)
Potassium: 3.9 mmol/L (ref 3.5–5.1)
Sodium: 135 mmol/L (ref 135–145)

## 2019-05-15 LAB — BPAM RBC
Blood Product Expiration Date: 202009242359
Blood Product Expiration Date: 202009252359
ISSUE DATE / TIME: 202009110013
ISSUE DATE / TIME: 202009110013
Unit Type and Rh: 7300
Unit Type and Rh: 7300

## 2019-05-15 LAB — PROTIME-INR
INR: 1.1 (ref 0.8–1.2)
Prothrombin Time: 14.5 seconds (ref 11.4–15.2)

## 2019-05-15 LAB — CBC
HCT: 30.9 % — ABNORMAL LOW (ref 36.0–46.0)
Hemoglobin: 10 g/dL — ABNORMAL LOW (ref 12.0–15.0)
MCH: 25.6 pg — ABNORMAL LOW (ref 26.0–34.0)
MCHC: 32.4 g/dL (ref 30.0–36.0)
MCV: 79 fL — ABNORMAL LOW (ref 80.0–100.0)
Platelets: 309 10*3/uL (ref 150–400)
RBC: 3.91 MIL/uL (ref 3.87–5.11)
RDW: 16.2 % — ABNORMAL HIGH (ref 11.5–15.5)
WBC: 7.7 10*3/uL (ref 4.0–10.5)
nRBC: 0 % (ref 0.0–0.2)

## 2019-05-15 LAB — TYPE AND SCREEN
ABO/RH(D): B POS
Antibody Screen: NEGATIVE
Unit division: 0
Unit division: 0

## 2019-05-15 LAB — HEPARIN LEVEL (UNFRACTIONATED): Heparin Unfractionated: 0.41 IU/mL (ref 0.30–0.70)

## 2019-05-15 SURGERY — ABDOMINAL AORTOGRAM W/LOWER EXTREMITY
Anesthesia: LOCAL | Laterality: Bilateral

## 2019-05-15 MED ORDER — FENTANYL CITRATE (PF) 100 MCG/2ML IJ SOLN
INTRAMUSCULAR | Status: DC | PRN
  Administered 2019-05-15: 50 ug via INTRAVENOUS

## 2019-05-15 MED ORDER — ACETAMINOPHEN 325 MG PO TABS
650.0000 mg | ORAL_TABLET | ORAL | Status: DC | PRN
  Filled 2019-05-15: qty 2

## 2019-05-15 MED ORDER — LABETALOL HCL 5 MG/ML IV SOLN: 10.0000 mg | INTRAVENOUS | Status: DC | PRN

## 2019-05-15 MED ORDER — IODIXANOL 320 MG/ML IV SOLN
INTRAVENOUS | Status: DC | PRN
  Administered 2019-05-15: 11:00:00 115 mL via INTRA_ARTERIAL

## 2019-05-15 MED ORDER — HEPARIN (PORCINE) IN NACL 1000-0.9 UT/500ML-% IV SOLN
INTRAVENOUS | Status: AC
  Filled 2019-05-15: qty 1000

## 2019-05-15 MED ORDER — SODIUM CHLORIDE 0.9% FLUSH: 3.0000 mL | INTRAVENOUS | Status: DC | PRN

## 2019-05-15 MED ORDER — LIDOCAINE HCL (PF) 1 % IJ SOLN
INTRAMUSCULAR | Status: DC | PRN
  Administered 2019-05-15: 15 mL via INTRADERMAL

## 2019-05-15 MED ORDER — FENTANYL CITRATE (PF) 100 MCG/2ML IJ SOLN
INTRAMUSCULAR | Status: AC
  Filled 2019-05-15: qty 2

## 2019-05-15 MED ORDER — SODIUM CHLORIDE 0.9 % IV SOLN: 250.0000 mL | INTRAVENOUS | Status: DC | PRN

## 2019-05-15 MED ORDER — SODIUM CHLORIDE 0.9 % WEIGHT BASED INFUSION: 1.0000 mL/kg/h | INTRAVENOUS | Status: AC

## 2019-05-15 MED ORDER — MIDAZOLAM HCL 2 MG/2ML IJ SOLN
INTRAMUSCULAR | Status: DC | PRN
  Administered 2019-05-15: 2 mg via INTRAVENOUS

## 2019-05-15 MED ORDER — ONDANSETRON HCL 4 MG/2ML IJ SOLN: 4.0000 mg | Freq: Four times a day (QID) | INTRAMUSCULAR | Status: DC | PRN

## 2019-05-15 MED ORDER — SODIUM CHLORIDE 0.9% FLUSH
3.0000 mL | Freq: Two times a day (BID) | INTRAVENOUS | Status: DC
  Administered 2019-05-15 – 2019-05-16 (×2): 3 mL via INTRAVENOUS

## 2019-05-15 MED ORDER — LIDOCAINE HCL (PF) 1 % IJ SOLN
INTRAMUSCULAR | Status: AC
  Filled 2019-05-15: qty 30

## 2019-05-15 MED ORDER — HEPARIN (PORCINE) IN NACL 1000-0.9 UT/500ML-% IV SOLN
INTRAVENOUS | Status: DC | PRN
  Administered 2019-05-15 (×2): 500 mL

## 2019-05-15 MED ORDER — HYDRALAZINE HCL 20 MG/ML IJ SOLN: 5.0000 mg | INTRAMUSCULAR | Status: DC | PRN

## 2019-05-15 MED ORDER — MIDAZOLAM HCL 2 MG/2ML IJ SOLN
INTRAMUSCULAR | Status: AC
  Filled 2019-05-15: qty 2

## 2019-05-15 SURGICAL SUPPLY — 11 items
CATH OMNI FLUSH 5F 65CM (CATHETERS) ×1 IMPLANT
CLOSURE MYNX CONTROL 5F (Vascular Products) ×1 IMPLANT
DRAPE ZERO GRAVITY STERILE (DRAPES) ×1 IMPLANT
KIT MICROPUNCTURE NIT STIFF (SHEATH) ×1 IMPLANT
KIT PV (KITS) ×2 IMPLANT
SHEATH PINNACLE 5F 10CM (SHEATH) ×1 IMPLANT
SHEATH PROBE COVER 6X72 (BAG) ×1 IMPLANT
SYR MEDRAD MARK V 150ML (SYRINGE) ×1 IMPLANT
TRANSDUCER W/STOPCOCK (MISCELLANEOUS) ×2 IMPLANT
TRAY PV CATH (CUSTOM PROCEDURE TRAY) ×2 IMPLANT
WIRE BENTSON .035X145CM (WIRE) ×1 IMPLANT

## 2019-05-15 NOTE — Progress Notes (Signed)
Physical Therapy Treatment Patient Details Name: Mercedes [name] MRN: [id number] DOB: [birth year] Today's Date: 05/15/2019    History of Present Illness 63 y.o. female admitted with LLE pain now s/p redo left fem to peroneal BPG. PMHx: HTN, HLD, CAD, CABG, anxiety, sjogren's and PAD with left femoral to below the knee popliteal BPG with great toe amputation by Dr. Oneida Alar in 2015    PT Comments    Pt long sitting in bed requesting to use BSC. Pt supervision for bed mobility and able to transfer to [facility] with min A. After performing self pericare in sitting with lateal leans pt able to come to standing from [facility] with min A for steadying. Pt utilizes minimal weightbearing (mainly for balance) through L heel for mainly hopping on R LE with BUE support on RW for 8 feet before pain in L foot caused her to stop and return to bed. Pt expresses wishes to return to home and reports she has availability of 24 hr family assist. PT recommending HHPT level rehab at discharge. PT also recommending wheelchair for discharge due to increased pain in L LE (especially in dependent position as in ambulation) to allow for safe mobility within her home environment as well as a 3 in 1.       Follow Up Recommendations  Home health PT;Supervision/Assistance - 24 hour     Equipment Recommendations  3in1 (PT);Wheelchair (measurements PT);Wheelchair cushion (measurements PT)       Precautions / Restrictions Precautions Precautions: Fall Restrictions Weight Bearing Restrictions: No    Mobility  Bed Mobility Overal bed mobility: Needs Assistance Bed Mobility: Supine to Sit     Supine to sit: Supervision     General bed mobility comments: supervision for lines, increased time,  Transfers Overall transfer level: Needs assistance Equipment used: Rolling walker (2 wheeled) Transfers: Sit to/from [facility] Sit to Stand: Min assist Stand pivot transfers: Min assist       General transfer  comment: minA for power up and steadying with stand pivot transfer to [facility], minA for steadying with sit>stand from [facility] to RW  Ambulation/Gait Ambulation/Gait assistance: Min guard Gait Distance (Feet): 8 Feet Assistive device: Rolling walker (2 wheeled) Gait Pattern/deviations: Step-through pattern;Decreased step length - right;Decreased stance time - left;Decreased weight shift to left;Antalgic Gait velocity: slowed Gait velocity interpretation: <1.31 ft/sec, indicative of household ambulator General Gait Details: min guard for safety, pt able to place L heel on the floor however unable to bear weight through L LE due to increased pain, only able to ambulate 8 feet due to increased pain with L LE in dependent position     Stairs             Wheelchair Mobility    Modified Rankin (Stroke Patients Only)       Balance Overall balance assessment: Needs assistance Sitting-balance support: Single extremity supported;Feet unsupported Sitting balance-Leahy Scale: Good Sitting balance - Comments: able to lean L and R to perform self pericare    Standing balance support: Bilateral upper extremity supported;During functional activity Standing balance-Leahy Scale: Fair Standing balance comment: required BUE support in standing; decreased ablity to bear weight through LLE                             Cognition Arousal/Alertness: Awake/alert Behavior During Therapy: WFL for tasks assessed/performed Overall Cognitive Status: Within Functional Limits for tasks assessed  Exercises General Exercises - Lower Extremity Ankle Circles/Pumps: AROM;Both;10 reps;Supine    General Comments General comments (skin integrity, edema, etc.): VSS, wounds dry and intact,       Pertinent Vitals/Pain Pain Assessment: 0-10 Pain Score: 8  Pain Location:  Left foot Pain Descriptors / Indicators: Aching;Burning Pain Intervention(s):  Limited activity within patient's tolerance;Monitored during session;Repositioned    Home Living                      Prior Function            PT Goals (current goals can now be found in the care plan section) Acute Rehab PT Goals Patient Stated Goal: return home PT Goal Formulation: With patient Time For Goal Achievement: 05/27/19 Potential to Achieve Goals: Good Progress towards PT goals: Progressing toward goals    Frequency    Min 3X/week      PT Plan Discharge plan needs to be updated;Equipment recommendations need to be updated       AM-PAC PT "6 Clicks" Mobility   Outcome Measure  Help needed turning from your back to your side while in a flat bed without using bedrails?: None Help needed moving from lying on your back to sitting on the side of a flat bed without using bedrails?: None Help needed moving to and from a bed to a chair (including a wheelchair)?: A Little Help needed standing up from a chair using your arms (e.g., wheelchair or bedside chair)?: A Little Help needed to walk in hospital room?: A Little Help needed climbing 3-5 steps with a railing? : Total 6 Click Score: 18    End of Session Equipment Utilized During Treatment: Gait belt Activity Tolerance: Patient limited by pain Patient left: in bed;with call bell/phone within reach;with bed alarm set Nurse Communication: Mobility status PT Visit Diagnosis: Other abnormalities of gait and mobility (R26.89);Pain Pain - Right/Left: Left Pain - part of body: Leg     Time: 1740-8144 PT Time Calculation (min) (ACUTE ONLY): 26 min  Charges:  $Gait Training: 8-22 mins $Therapeutic Activity: 8-22 mins                     Kairos Panetta B. Beverely Risen PT, DPT Acute Rehabilitation Services Pager [phone] Office [phone]    [name] 05/15/2019, 3:20 PM

## 2019-05-15 NOTE — Progress Notes (Signed)
[facility] [place]  Subjective  - left 2nd toe hurts   Objective (!) 152/69 72 98.8 F (37.1 C) (Oral) (!) 22 100%  Intake/Output Summary (Last 24 hours) at 05/15/2019 0751 Last data filed at 05/15/2019 0300 Gross per 24 hour  Intake 1886 ml  Output -  Net 1886 ml    Left 0.7 improved from 0 Right 0.4 at baseline  Incisions healing foot warm   Assessment/Planning: Angio today D/c home tomorrow if no additional angio findings Resume Eliquis tomorrow morning prior to d/c.   This is for afib and this could certainly have been an embolic event  Ruta Hinds 05/15/2019 7:51 AM --  Laboratory Lab Results: Recent Labs    05/14/19 0305 05/15/19 0348  WBC 7.4 7.7  HGB 9.5* 10.0*  HCT 28.7* 30.9*  PLT 304 309   BMET Recent Labs    05/13/19 0500 05/15/19 0348  NA 134* 135  K 4.3 3.9  CL 102 104  CO2 22 23  GLUCOSE 101* 111*  BUN 11 9  CREATININE 0.89 0.80  CALCIUM 8.4* 8.4*    COAG Lab Results  Component Value Date   INR 1.1 05/15/2019   INR 1.2 05/12/2019   INR 1.1 05/09/2019   No results found for: PTT

## 2019-05-15 NOTE — Progress Notes (Signed)
PT Cancellation Note  Patient Details Name: Mercedes [name] MRN: [id number] DOB: Nov 14, 1955   Cancelled Treatment:    Reason Eval/Treat Not Completed: (P) Patient at procedure or test/unavailable PT will follow back this afternoon for treatment as able.  Corri Delapaz B. Migdalia Dk PT, DPT Acute Rehabilitation Services Pager [phone] Office [phone]    [place] 05/15/2019, 10:02 AM

## 2019-05-15 NOTE — Op Note (Signed)
    Patient name: Mercedes [name] MRN: [id number] DOB: 20-Feb-1956 Sex: female  05/15/2019 Pre-operative Diagnosis: Recent bypass graft occlusion Post-operative diagnosis:  Same Surgeon:  Annamarie Major Procedure Performed:  1.  Ultrasound-guided access, right femoral artery  2.  Abdominal aortogram  3.  Bilateral lower extremity runoff  4.  Second-order catheterization  5.  Conscious sedation (34 minutes)  6.  Closure device (Mynx)    Indications: Patient has a history of a left femoral-popliteal bypass graft.  This recently occluded and required surgical thrombectomy.  She is here today to further evaluate her bypass to make sure there were no issues.  Procedure:  The patient was identified in the holding area and taken to room 8.  The patient was then placed supine on the table and prepped and draped in the usual sterile fashion.  A time out was called.  Conscious sedation was administered with the use of IV fentanyl and Versed under continuous physician and nurse monitoring.  Heart rate, blood pressure, and oxygen saturation were continuously monitored.  Total sedation time was 34 minutes.  Ultrasound was used to evaluate the right common femoral artery.  It was patent .  A digital ultrasound image was acquired.  A micropuncture needle was used to access the right common femoral artery under ultrasound guidance.  An 018 wire was advanced without resistance and a micropuncture sheath was placed.  The 018 wire was removed and a benson wire was placed.  The micropuncture sheath was exchanged for a 5 french sheath.  An omniflush catheter was advanced over the wire to the level of L-1.  An abdominal angiogram was obtained.  Next, using the omniflush catheter and a benson wire, the aortic bifurcation was crossed and the catheter was placed into theleft external iliac artery and left runoff was obtained.  right runoff was performed via retrograde sheath injections.  Findings:   Aortogram: No  significant renal artery stenosis identified.  The infrarenal abdominal is widely patent.  Bilateral common and external iliac arteries are widely patent.  Right Lower Extremity: The right common femoral and profundofemoral artery are small in caliber but patent without stenosis.  Superficial femoral artery is occluded with reconstitution of the above-knee popliteal artery at the adductor canal.  There is single-vessel runoff via the peroneal artery which reconstitutes a dorsalis pedis across the ankle.  Left Lower Extremity: There is a filling defect within the left common femoral artery that does not appear to be hemodynamically significant.  The profundofemoral artery is widely patent.  The femoral-popliteal bypass graft is widely patent with single-vessel runoff through the peroneal artery  Intervention: None.  A minx device was used for closure without complication  Impression:  #1  Widely patent left leg bypass graft with small filling defect within the common femoral artery without hemodynamic significance  #2  Occluded right superficial femoral artery   V. Annamarie Major, M.D., [facility] [place] Office: [phone] Pager:  [phone]

## 2019-05-15 NOTE — Progress Notes (Signed)
ANTICOAGULATION CONSULT NOTE - Follow Up Consult  Pharmacy Consult for Heparin Indication: ischemic foot s/p thrombectomy  Allergies  Allergen Reactions  . Brilinta [Ticagrelor] Anaphylaxis and Other (See Comments)    Also, chest tightness  . Latex Rash    Patient Measurements: Height: 5' 5.5" (166.4 cm) Weight: 108 lb 11 oz (49.3 kg) IBW/kg (Calculated) : 58.15 Heparin Dosing Weight: 49.3 kg  Vital Signs: Temp: 99.3 F (37.4 C) (09/17 0927) Temp Source: Oral (09/17 0927) BP: 157/83 (09/17 1040) Pulse Rate: 0 (09/17 1049)  Labs: Recent Labs    05/13/19 0500 05/14/19 0305 05/14/19 1002 05/15/19 0348  HGB 10.2* 9.5*  --  10.0*  HCT 31.1* 28.7*  --  30.9*  PLT 297 304  --  309  LABPROT  --   --   --  14.5  INR  --   --   --  1.1  HEPARINUNFRC 0.35 0.23* 0.42 0.41  CREATININE 0.89  --   --  0.80    Estimated Creatinine Clearance: 54.6 mL/min (by C-G formula based on SCr of 0.8 mg/dL).  Assessment:  Anticoag: PTA apixaban (LD unknown). Heparin for ischemic foot. Hep level 0.41. Hgb 10 stable.. Plts WNL and stable.   Goal of Therapy:  Heparin level 0.3-0.7 units/ml Monitor platelets by anticoagulation protocol: Yes   Plan:  IV heparin 750 units/hr Daily heparin level/CBC Resume Eliquis 9/18 AM prior to discharge  Katelin Kutsch S. Alford Highland, PharmD, [place] Clinical Staff Pharmacist Eilene Ghazi Stillinger 05/15/2019,1:10 PM

## 2019-05-16 LAB — CBC
HCT: 30.2 % — ABNORMAL LOW (ref 36.0–46.0)
Hemoglobin: 9.3 g/dL — ABNORMAL LOW (ref 12.0–15.0)
MCH: 24.7 pg — ABNORMAL LOW (ref 26.0–34.0)
MCHC: 30.8 g/dL (ref 30.0–36.0)
MCV: 80.3 fL (ref 80.0–100.0)
Platelets: 357 10*3/uL (ref 150–400)
RBC: 3.76 MIL/uL — ABNORMAL LOW (ref 3.87–5.11)
RDW: 16.2 % — ABNORMAL HIGH (ref 11.5–15.5)
WBC: 7.3 10*3/uL (ref 4.0–10.5)
nRBC: 0 % (ref 0.0–0.2)

## 2019-05-16 LAB — HEPARIN LEVEL (UNFRACTIONATED): Heparin Unfractionated: 0.26 IU/mL — ABNORMAL LOW (ref 0.30–0.70)

## 2019-05-16 MED ORDER — ROSUVASTATIN CALCIUM 10 MG PO TABS: 10.0000 mg | ORAL_TABLET | Freq: Every day | ORAL | 2 refills | Status: DC

## 2019-05-16 MED ORDER — OXYCODONE-ACETAMINOPHEN 5-325 MG PO TABS: 1.0000 | ORAL_TABLET | Freq: Four times a day (QID) | ORAL | 0 refills | Status: DC | PRN

## 2019-05-16 MED ORDER — ASPIRIN 81 MG PO TBEC: 81.0000 mg | DELAYED_RELEASE_TABLET | Freq: Every day | ORAL | Status: DC

## 2019-05-16 MED ORDER — APIXABAN 5 MG PO TABS
5.0000 mg | ORAL_TABLET | Freq: Two times a day (BID) | ORAL | Status: DC
  Administered 2019-05-16: 10:00:00 5 mg via ORAL
  Filled 2019-05-16: qty 1

## 2019-05-16 NOTE — Progress Notes (Addendum)
   Durable Medical Equipment (From admission, onward)       Start     Ordered  05/16/19 1228   For home use only DME lightweight manual wheelchair with seat cushion  Once   Comments: Patient suffers from recent thrombectomy of Left leg bypass grafting for critical limb ischemia and angiogram which impairs their ability to perform daily activities like dressing in the home.  A cane will not resolve  issue with performing activities of daily living. A wheelchair will allow patient to safely perform daily activities. Patient is not able to propel themselves in the home using a standard weight wheelchair due to general weakness. Patient can self propel in the lightweight wheelchair. Length of need 2-4 weeks. Accessories: elevating leg rests (ELRs), wheel locks, extensions and anti-tippers. Back cushion  05/16/19 1228  05/16/19 1227   For home use only DME Walker rolling  Once   Question:  Patient needs a walker to treat with the following condition  Answer:  Critical limb ischemia with history of revascularization of same extremity  05/16/19 1228  05/16/19 1227   For home use only DME 3 n 1  Once    05/16/19 1228

## 2019-05-16 NOTE — Progress Notes (Signed)
Discharge AVS meds take and those due reviewed with pt. Follow up appointments and when to call MD reviewed. All questions and concerns addressed. No further questions at this time. D/c IV and TELE, CCMD notified. D/C home per orders. Pt brought out in home wheelchair with other home health equipment.  Amanda Cockayne, RN

## 2019-05-16 NOTE — Progress Notes (Signed)
Physical Therapy Treatment Patient Details Name: Mercedes [name] MRN: [id number] DOB: [birth year] Today's Date: 05/16/2019    History of Present Illness 63 y.o. female admitted with LLE pain now s/p redo left fem to peroneal BPG. PMHx: HTN, HLD, CAD, CABG, anxiety, sjogren's and PAD with left femoral to below the knee popliteal BPG with great toe amputation by Dr. Darrick Penna in 2015    PT Comments    Patient received in bed talking on phone. RN advised that patient wanted some type of shoe or boot for her left foot. When I arrived into room patient reports needing something to reduce pain and give support. Got order for post op shoe, shoe arrived and patient became more agitated that she has not been walking, did not receive shoe prior to now right before she is going home and she had surgery on Monday. Patient basically stopped cooperating with me at all at that time. Patient did get up and ambulate with shoe and rw to bathroom and back independently, but declined walking further with me at this time. Patient planning to return home today.       Follow Up Recommendations  Home health PT;Supervision - Intermittent     Equipment Recommendations       Recommendations for Other Services       Precautions / Restrictions Precautions Precautions: Fall Required Braces or Orthoses: Other Brace(post op shoe) Restrictions Weight Bearing Restrictions: No    Mobility  Bed Mobility Overal bed mobility: Independent Bed Mobility: Supine to Sit     Supine to sit: Independent        Transfers Overall transfer level: Independent Equipment used: Rolling walker (2 wheeled) Transfers: Sit to/from Stand Sit to Stand: Independent         General transfer comment: patient came out of bathroom after using toilet independently.  Ambulation/Gait Ambulation/Gait assistance: Independent Gait Distance (Feet): 15 Feet Assistive device: Rolling walker (2 wheeled) Gait Pattern/deviations:  Antalgic     General Gait Details: patient ambulated into and out of bathroom independently with post op shoe donned and rolling walker. Declines ambulating further with me out in hallway.   Stairs             Wheelchair Mobility    Modified Rankin (Stroke Patients Only)       Balance Overall balance assessment: Modified Independent Sitting-balance support: Feet supported Sitting balance-Leahy Scale: Normal     Standing balance support: Bilateral upper extremity supported Standing balance-Leahy Scale: Good                              Cognition Arousal/Alertness: Awake/alert Behavior During Therapy: WFL for tasks assessed/performed Overall Cognitive Status: Within Functional Limits for tasks assessed                                 General Comments: patient agitated about not having shoe to wear on left foot. RN told me about it prior to entering room. From that point on patient was not very cooperative.      Exercises      General Comments        Pertinent Vitals/Pain Pain Assessment: 0-10 Pain Score: 8  Pain Location:  Left foot Pain Descriptors / Indicators: Aching;Burning;Operative site guarding Pain Intervention(s): Patient requesting pain meds-RN notified    Home Living  Prior Function            PT Goals (current goals can now be found in the care plan section) Acute Rehab PT Goals Patient Stated Goal: return home PT Goal Formulation: With patient Time For Goal Achievement: 05/27/19 Potential to Achieve Goals: Good Progress towards PT goals: Progressing toward goals    Frequency    Min 3X/week      PT Plan      Co-evaluation              AM-PAC PT "6 Clicks" Mobility   Outcome Measure  Help needed turning from your back to your side while in a flat bed without using bedrails?: None Help needed moving from lying on your back to sitting on the side of a flat bed  without using bedrails?: None Help needed moving to and from a bed to a chair (including a wheelchair)?: None Help needed standing up from a chair using your arms (e.g., wheelchair or bedside chair)?: None Help needed to walk in hospital room?: None Help needed climbing 3-5 steps with a railing? : A Little 6 Click Score: 23    End of Session Equipment Utilized During Treatment: Gait belt;Other (comment)(post op shoe) Activity Tolerance: Patient limited by pain   Nurse Communication: Mobility status(discussed patient's annoyance about shoe and her unwillingness to cooperate with me.) PT Visit Diagnosis: Difficulty in walking, not elsewhere classified (R26.2) Pain - Right/Left: Left Pain - part of body: Ankle and joints of foot     Time: 1210-1237 PT Time Calculation (min) (ACUTE ONLY): 27 min  Charges:  $Therapeutic Activity: 23-37 mins                     Hanley Rispoli, PT, GCS 05/16/19,1:31 PM

## 2019-05-16 NOTE — Discharge Instructions (Signed)
° °[facility] ° °Discharge instructions ° °Lower Extremity Bypass Surgery ° °Please refer to the following instruction for your post-procedure care. Your surgeon or physician assistant will discuss any changes with you. ° °Activity ° °You are encouraged to walk as much as you can. You can slowly return to normal activities during the month after your surgery. Avoid strenuous activity and heavy lifting until your doctor tells you it's OK. Avoid activities such as vacuuming or swinging a golf club. Do not drive until your doctor give the OK and you are no longer taking prescription pain medications. It is also normal to have difficulty with sleep habits, eating and bowel movement after surgery. These will go away with time. ° °Bathing/Showering ° °Shower daily after you go home. Do not soak in a bathtub, hot tub, or swim until the incision heals completely. ° °Incision Care ° °Clean your incision with mild soap and water. Shower every day. Pat the area dry with a clean towel. You do not need a bandage unless otherwise instructed. Do not apply any ointments or creams to your incision. If you have open wounds you will be instructed how to care for them or a visiting nurse may be arranged for you. If you have staples or sutures along your incision they will be removed at your post-op appointment. You may have skin glue on your incision. Do not peel it off. It will come off on its own in about one week. ° °Wash the groin wound with soap and water daily and pat dry. (No tub bath-only shower)  Then put a dry gauze or washcloth in the groin to keep this area dry to help prevent wound infection.  Do this daily and as needed.  Do not use Vaseline or neosporin on your incisions.  Only use soap and water on your incisions and then protect and keep dry. ° °Diet ° °Resume your normal diet. There are no special food restrictions following this procedure. A low fat/ low cholesterol diet is  recommended for all patients with vascular disease. In order to heal from your surgery, it is CRITICAL to get adequate nutrition. Your body requires vitamins, minerals, and protein. Vegetables are the best source of vitamins and minerals. Vegetables also provide the perfect balance of protein. Processed food has little nutritional value, so try to avoid this. ° °Medications ° °Resume taking all your medications unless your doctor or physician assistant tells you not to. If your incision is causing pain, you may take over-the-counter pain relievers such as acetaminophen (Tylenol). If you were prescribed a stronger pain medication, please aware these medication can cause nausea and constipation. Prevent nausea by taking the medication with a snack or meal. Avoid constipation by drinking plenty of fluids and eating foods with high amount of fiber, such as fruits, vegetables, and grains. Take Colace 100 mg (an over-the-counter stool softener) twice a day as needed for constipation.  °Do not take Tylenol if you are taking prescription pain medications. ° °Follow Up ° °Our office will schedule a follow up appointment 2-3 weeks following discharge. ° °Please call us immediately for any of the following conditions ° °•Severe or worsening pain in your legs or feet while at rest or while walking •Increase pain, redness, warmth, or drainage (pus) from your incision site(s) °Fever of 101 degree or higher °The swelling in your leg with the bypass suddenly worsens and becomes more painful than when you were in the hospital °If you have   been instructed to feel your graft pulse then you should do so every day. If you can no longer feel this pulse, call the office immediately. Not all patients are given this instruction. ° °Leg swelling is common after leg bypass surgery. ° °The swelling should improve over a few months following surgery. To improve the swelling, you may elevate your legs above the level of your heart while you are  sitting or resting. Your surgeon or physician assistant may ask you to apply an ACE wrap or wear compression (TED) stockings to help to reduce swelling. ° °Reduce your risk of vascular disease ° °Stop smoking. If you would like help call [facility] at 1-800-QUIT-NOW (1-800-784-8669) or [facility] at 336-586-4000. ° °Manage your cholesterol °Maintain a desired weight °Control your diabetes weight °Control your diabetes °Keep your blood pressure down ° °If you have any questions, please call the office at [phone] ° ° ° °Information on my medicine - ELIQUIS® (apixaban) ° °Why was Eliquis® prescribed for you? °Eliquis® was prescribed for you to reduce the risk of a blood clot forming that can cause a stroke if you have a medical condition called atrial fibrillation (a type of irregular heartbeat). ° °What do You need to know about Eliquis® ? °Take your Eliquis® TWICE DAILY - one tablet in the morning and one tablet in the evening with or without food. If you have difficulty swallowing the tablet whole please discuss with your pharmacist how to take the medication safely. ° °Take Eliquis® exactly as prescribed by your doctor and DO NOT stop taking Eliquis® without talking to the doctor who prescribed the medication.  Stopping may increase your risk of developing a stroke.  Refill your prescription before you run out. ° °After discharge, you should have regular check-up appointments with your healthcare provider that is prescribing your Eliquis®.  In the future your dose may need to be changed if your kidney function or weight changes by a significant amount or as you get older. ° °What do you do if you miss a dose? °If you miss a dose, take it as soon as you remember on the same day and resume taking twice daily.  Do not take more than one dose of ELIQUIS at the same time to make up a missed dose. ° °Important Safety Information °A possible side effect of Eliquis® is bleeding. You should call your healthcare  provider right away if you experience any of the following: °? Bleeding from an injury or your nose that does not stop. °? Unusual colored urine (red or dark brown) or unusual colored stools (red or black). °? Unusual bruising for unknown reasons. °? A serious fall or if you hit your head (even if there is no bleeding). ° °Some medicines may interact with Eliquis® and might increase your risk of bleeding or clotting while on Eliquis®. To help avoid this, consult your healthcare provider or pharmacist prior to using any new prescription or non-prescription medications, including herbals, vitamins, non-steroidal anti-inflammatory drugs (NSAIDs) and supplements. ° °This website has more information on Eliquis® (apixaban): http://www.eliquis.com/eliquis/home °

## 2019-05-16 NOTE — Progress Notes (Addendum)
  Progress Note    05/16/2019 8:13 AM 1 Day Post-Op  Subjective:  Feels much better and states she is getting color back in her toe and her foot feels better  Afebrile HR 50's-70's  893'Y-101'B systolic 510% RA  Vitals:   05/16/19 0450 05/16/19 0500  BP:  (!) 149/69  Pulse:  69  Resp: 18 17  Temp:  97.9 F (36.6 C)  SpO2:  100%    Physical Exam: Cardiac:  regular Lungs:  Non labored Incisions:  Left groin and left BK incisions look good; right groin soft without hematoms Extremities:  Brisk doppler signals left DP/peroneal   CBC    Component Value Date/Time   WBC 7.3 05/16/2019 0242   RBC 3.76 (L) 05/16/2019 0242   HGB 9.3 (L) 05/16/2019 0242   HGB 14.2 10/09/2018 1125   HCT 30.2 (L) 05/16/2019 0242   HCT 44.6 10/09/2018 1125   PLT 357 05/16/2019 0242   PLT 289 10/09/2018 1125   MCV 80.3 05/16/2019 0242   MCV 79 10/09/2018 1125   MCH 24.7 (L) 05/16/2019 0242   MCHC 30.8 05/16/2019 0242   RDW 16.2 (H) 05/16/2019 0242   RDW 14.8 10/09/2018 1125   LYMPHSABS 2.1 05/09/2019 1121   MONOABS 0.5 05/09/2019 1121   EOSABS 0.1 05/09/2019 1121   BASOSABS 0.1 05/09/2019 1121    BMET    Component Value Date/Time   NA 135 05/15/2019 0348   NA 135 10/09/2018 1125   K 3.9 05/15/2019 0348   CL 104 05/15/2019 0348   CO2 23 05/15/2019 0348   GLUCOSE 111 (H) 05/15/2019 0348   BUN 9 05/15/2019 0348   BUN 10 10/09/2018 1125   CREATININE 0.80 05/15/2019 0348   CREATININE 1.13 (H) 08/03/2016 1440   CALCIUM 8.4 (L) 05/15/2019 0348   GFRNONAA >60 05/15/2019 0348   GFRAA >60 05/15/2019 0348    INR    Component Value Date/Time   INR 1.1 05/15/2019 0348     Intake/Output Summary (Last 24 hours) at 05/16/2019 0813 Last data filed at 05/16/2019 0700 Gross per 24 hour  Intake 1832.33 ml  Output -  Net 1832.33 ml     Assessment:  63 y.o. female is s/p:  Thrombectomy left femoropopliteal bypass patch angioplasty proximal left femoropopliteal and common femoral  artery, thrombectomy left external iliac artery, profunda, superficial femoral artery  1 Day Post-Op  Plan: -pt with brisk doppler signals left DP/peroneal and sx significantly improved -pharmacy to restart Eliquis -home today & f/u with Dr. Oneida Alar in 2-3 weeks. -discussed groin wound care with pt.   Leontine Locket, PA-C [facility] [phone] 05/16/2019 8:13 AM  Agree with above.  D/c home  Ruta Hinds, MD [facility] Office: [phone] Pager: [phone]

## 2019-05-16 NOTE — Progress Notes (Signed)
ANTICOAGULATION CONSULT NOTE - Follow Up Consult  Pharmacy Consult for heparin Indication: ischemic foot  Labs: Recent Labs    05/13/19 0500 05/14/19 0305 05/14/19 1002 05/15/19 0348 05/16/19 0242  HGB 10.2* 9.5*  --  10.0* 9.3*  HCT 31.1* 28.7*  --  30.9* 30.2*  PLT 297 304  --  309 357  LABPROT  --   --   --  14.5  --   INR  --   --   --  1.1  --   HEPARINUNFRC 0.35 0.23* 0.42 0.41 0.26*  CREATININE 0.89  --   --  0.80  --     Assessment: 63yo female subtherapeutic on heparin after two levels at goal; gtt had been held yesterday but night RN states that heparin was running when he came on shift at 7p; no gtt issues or signs of bleeding per RN.  Goal of Therapy:  Heparin level 0.3-0.7 units/ml   Plan:  Will increase heparin gtt by 1 unit/kg/hr to 800 units/hr and check level in 6 hours.    Wynona Neat, PharmD, BCPS  05/16/2019,4:19 AM

## 2019-05-16 NOTE — Discharge Summary (Signed)
Discharge Summary     Mercedes [name] Jan 25, 1956 63 y.o. female  [id number]  Admission Date: 05/09/2019  Discharge Date: 05/16/2019  Physician: Sherren Kerns, MD  Admission Diagnosis: Limb ischemia [I99.8]  HPI:   This is a 63 y.o. female who presents with chief complaint: Rest pain left foot increasing over the past 2 weeks.  Surgical history significant for left femoral to popliteal bypass with vein and great toe amputation by Dr. Darrick Penna in 2015.  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  Patient canceled this surgery about 2 months ago and has not return to office.  Patient now states she is only able to walk about 20 feet before having to rest and also has to hang her foot over the bed at night for relief.  She denies any active tissue ischemia of left foot.  She is taking Eliquis.  She is an everyday smoker.  Patient states she has followed up with her GI provider several times since visit and denies any ongoing abdominal pain.  She also denies any further weight loss or fear of food.  The patient's PMH, PSH, SH, and FamHx were reviewed and are unchanged from prior visit.  Hospital Course:  The patient was admitted to the hospital and Eliquis discontinued and heparin started.   She was started on asa/statin tx.   She was taken to the operating room on 05/12/2019 and underwent: Thrombectomy left femoropopliteal bypass patch angioplasty proximal left femoropopliteal and common femoral artery, thrombectomy left external iliac artery, profunda, superficial femoral artery    Findings: Subacute clot left common femoral profunda femoropopliteal and distal external iliac artery  The pt tolerated the procedure well and was transported to the PACU in good condition.   In the recovery room, she had a warm left foot with PT doppler signal.  She was transferred to 4  east.    By POD 1, she was doing much better.  She had regained inline flow via left peroneal after thrombectomy and patch angioplasty of femoral bypass.   POD 2, pt continued to have brisk doppler flow.  Plan for angiogram on 9/17.    On 9/17, she was taken to the [facility] lab and underwent:  Procedure Performed:             1.  Ultrasound-guided access, right femoral artery             2.  Abdominal aortogram             3.  Bilateral lower extremity runoff             4.  Second-order catheterization             5.  Conscious sedation (34 minutes)             6.  Closure device (Mynx)  Findings:              Aortogram: No significant renal artery stenosis identified.  The infrarenal abdominal is widely patent.  Bilateral common and external iliac arteries are widely patent.             Right Lower Extremity: The right common femoral and profundofemoral artery are small in caliber but patent without stenosis.  Superficial femoral artery is occluded with reconstitution of the above-knee popliteal artery at the adductor canal.  There is single-vessel runoff via the peroneal  artery which reconstitutes a dorsalis pedis across the ankle.             Left Lower Extremity: There is a filling defect within the left common femoral artery that does not appear to be hemodynamically significant.  The profundofemoral artery is widely patent.  The femoral-popliteal bypass graft is widely patent with single-vessel runoff through the peroneal artery  Impression:             #1  Widely patent left leg bypass graft with small filling defect within the common femoral artery without hemodynamic significance             #2  Occluded right superficial femoral artery  On 9/18, she was doing well with brisk doppler signals left DP/peroneal and symptoms had significantly improved.  She was restarted on her Eliquis.  She was discharged home.   The remainder of the hospital course consisted of increasing mobilization  and increasing intake of solids without difficulty.  CBC    Component Value Date/Time   WBC 7.3 05/16/2019 0242   RBC 3.76 (L) 05/16/2019 0242   HGB 9.3 (L) 05/16/2019 0242   HGB 14.2 10/09/2018 1125   HCT 30.2 (L) 05/16/2019 0242   HCT 44.6 10/09/2018 1125   PLT 357 05/16/2019 0242   PLT 289 10/09/2018 1125   MCV 80.3 05/16/2019 0242   MCV 79 10/09/2018 1125   MCH 24.7 (L) 05/16/2019 0242   MCHC 30.8 05/16/2019 0242   RDW 16.2 (H) 05/16/2019 0242   RDW 14.8 10/09/2018 1125   LYMPHSABS 2.1 05/09/2019 1121   MONOABS 0.5 05/09/2019 1121   EOSABS 0.1 05/09/2019 1121   BASOSABS 0.1 05/09/2019 1121    BMET    Component Value Date/Time   NA 135 05/15/2019 0348   NA 135 10/09/2018 1125   K 3.9 05/15/2019 0348   CL 104 05/15/2019 0348   CO2 23 05/15/2019 0348   GLUCOSE 111 (H) 05/15/2019 0348   BUN 9 05/15/2019 0348   BUN 10 10/09/2018 1125   CREATININE 0.80 05/15/2019 0348   CREATININE 1.13 (H) 08/03/2016 1440   CALCIUM 8.4 (L) 05/15/2019 0348   GFRNONAA >60 05/15/2019 0348   GFRAA >60 05/15/2019 0348     Discharge Instructions    Discharge patient   Complete by: As directed    Discharge disposition: 01-Home or Self Care   Discharge patient date: 05/16/2019      Discharge Diagnosis:  Limb ischemia [I99.8]  Secondary Diagnosis: Patient Active Problem List   Diagnosis Date Noted   Protein-calorie malnutrition, severe 05/15/2019   Critical lower limb ischemia 05/09/2019   Dyspnea 02/01/2019   Weight loss, unintentional 02/01/2019   Epigastric pain 02/01/2019   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   Acute pyelonephritis 10/20/2018   Acute renal failure superimposed on stage 2 chronic kidney disease ([name]) 10/20/2018   Diabetes ([place]) 04/01/2017   Hyperthyroidism 02/21/2017   Low mean corpuscular volume (MCV) 09/30/2016   Coronary artery disease involving native heart without angina pectoris    Hx of CABG 07/17/2016   Paroxysmal atrial fibrillation  ([place]) 07/12/2016   NSTEMI (non-ST elevated myocardial infarction) ([place]) 07/11/2016   History of arterial bypass of lower extremity 12/30/2015   Claudication of right lower extremity ([place]) 12/30/2015   Phantom limb pain-Left great toe 04/29/2015   Seizure ([place])    Atrial flutter with rapid ventricular response vs. SVT 10/27/2014    Class: Acute   Atherosclerosis of native artery of left lower extremity with rest  pain (HCC) 12/18/2013   PAD (peripheral artery disease) (HCC) 12/02/2013   SLE (systemic lupus erythematosus) (HCC)    CAD - S/P Takotsubo MI in 2000, CFX DES Sept 2014 05/25/2013   Acute MI, inferior wall (HCC) 05/19/2013   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   Tachycardia-bradycardia (HCC) 03/09/2010   [facility] 10/29/2009   Dyslipidemia 08/26/2009   Essential hypertension 08/26/2009   CORONARY ATHEROSCLEROSIS NATIVE CORONARY ARTERY 08/26/2009   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 in the past when on Lipitor   Anemia    as a young woman   Anxiety    Arnold-Chiari malformation, type I (HCC)    MRI brain 02/2010   CAD (coronary artery disease)    a. NSTEMI 07/2009: LHC - D2 40%, LAD irreg., EF 50% with apical AK (tako-tsubo CM);  b. Inf STEMI (04/2013): Tx Promus DES to CFX;  c. 08/2013 Lexi CL: No ischemia, dist ant/ap/inf-ap infarct, EF  d. 06/2016 NSTEMI with LM disease: s/p CABG on 07/17/2016 w/ LIMA-LAD, SVG-OM, and SVG-dRCA.   DEPRESSION    Dizziness    ? CVA 01/2010 - carotid dopplers with no ICA stenosis   GERD (gastroesophageal reflux disease)    HYPERLIPIDEMIA    HYPERTENSION    MI (myocardial infarction) (HCC)    PAD (peripheral artery disease) (HCC)    s/p L fem pop 11/2013 in setting of 1st toe osteo/gangrene   Paroxysmal atrial fibrillation (HCC)    a. anticoagulated with Eliquis 10/2014   Presence of  permanent cardiac pacemaker 10/2014   leadless permanent pacemaker   RA (rheumatoid arthritis) (HCC)    prior tx by Dr. Dareen PianoAnderson   Sjogren's syndrome (HCC) 06/02/13   pt denies this (12/01/13)   SLE (systemic lupus erythematosus) (HCC) dx 05/2013   follows with rheum Dareen Piano([place])   Tachy-brady syndrome [facility](HCC)    a. 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   Eliquis 5 MG Tabs tablet Generic drug: apixaban TAKE 1 TABLET BY MOUTH TWICE DAILY What changed: how much to take   lisinopril 10 MG tablet Commonly known as: ZESTRIL Take 1 tablet (10 mg total) by mouth daily.   metoprolol tartrate 25 MG tablet Commonly known as: LOPRESSOR Take 0.5 tablets (12.5 mg total) by mouth 2 (two) times daily.   nitroGLYCERIN 0.4 MG SL tablet Commonly known as: NITROSTAT Place 1 tablet (0.4 mg total) under the tongue every 5 (five) minutes as needed for chest pain.   oxyCODONE-acetaminophen 5-325 MG tablet Commonly known as: PERCOCET/ROXICET Take 1 tablet by mouth every 6 (six) hours as needed for severe pain.   rosuvastatin 10 MG tablet Commonly known as: CRESTOR Take 1 tablet (10 mg total) by mouth daily at 6 PM.       Discharge Instructions: [facility] Discharge instructions Lower Extremity Bypass Surgery  Please refer to the following instruction for your post-procedure care. Your  surgeon or physician assistant will discuss any changes with you.  Activity  You are encouraged to walk as much as you can. You can slowly return to normal activities during the month after your  surgery. Avoid strenuous activity and heavy lifting until your doctor tells you it's OK. Avoid activities such as vacuuming or swinging a golf club. Do not drive until your doctor give the OK and you are no longer taking prescription pain medications. It is also normal to have difficulty with sleep habits, eating and bowel movement after surgery. These will go away with time.  Bathing/Showering  You may shower after you go home. Do not soak in a bathtub, hot tub, or swim until the incision heals completely.  Incision Care  Clean your incision with mild soap and water. Shower every day. Pat the area dry with a clean towel. You do not need a bandage unless otherwise instructed. Do not apply any ointments or creams to your incision. If you have open wounds you will be instructed how to care for them or a visiting nurse may be arranged for you. If you have staples or sutures along your incision they will be removed at your post-op appointment. You may have skin glue on your incision. Do not peel it off. It will come off on its own in about one week.  Wash the groin wound with soap and water daily and pat dry. (No tub bath-only shower)  Then put a dry gauze or washcloth in the groin to keep this area dry to help prevent wound infection.  Do this daily and as needed.  Do not use Vaseline or neosporin on your incisions.  Only use soap and water on your incisions and then protect and keep dry.  Diet  Resume your normal diet. There are no special food restrictions following this procedure. A low fat/ low cholesterol diet is recommended for all patients with vascular disease. In order to heal from your surgery, it is CRITICAL to get adequate nutrition. Your body requires vitamins, minerals, and protein. Vegetables are the best source of vitamins and minerals. Vegetables also provide the perfect balance of protein. Processed food has little nutritional value, so try to avoid this.  Medications  Resume taking  all your medications unless your doctor or Physician Assistant tells you not to. If your incision is causing pain, you may take over-the-counter pain relievers such as acetaminophen (Tylenol). If you were prescribed a stronger pain medication, please aware these medication can cause nausea and constipation. Prevent nausea by taking the medication with a snack or meal. Avoid constipation by drinking plenty of fluids and eating foods with high amount of fiber, such as fruits, vegetables, and grains. Take Colace 100 mg (an over-the-counter stool softener) twice a day as needed for constipation.  Do not take Tylenol if you are taking prescription pain medications.  Follow Up  Our office will schedule a follow up appointment 2-3 weeks following discharge.  Please call us immediately for any of the following conditions  Severe or worsening pain in your legs or feet while at rest or while walking Increase pain, redness, warmth, or drainage (pus) from your incision site(s)  Fever of 101 degree or higher  The swelling in your leg with the bypass suddenly worsens and becomes more painful than when you were in the hospital  If you have been instructed to feel your graft pulse then you should do so every day. If you can no longer feel this pulse,  call the office immediately. Not all patients are given this instruction.   Leg swelling is common after leg bypass surgery.  The swelling should improve over a few months following surgery. To improve the swelling, you may elevate your legs above the level of your heart while you are sitting or resting. Your surgeon or physician assistant may ask you to apply an ACE wrap or wear compression (TED) stockings to help to reduce swelling.  Reduce your risk of vascular disease  Stop smoking. If you would like help call [facility] at 1-800-QUIT-NOW (3128537197) or [place] at [phone].   Manage your cholesterol  Maintain a desired weight  Control  your diabetes weight  Control your diabetes  Keep your blood pressure down   If you have any questions, please call the office at [phone]   Prescriptions given: 1.  Roxicet #20 No Refill 2.  Asa 81mg  daily OTC 3.  Crestor 10mg  daily #30 two refills (refills per PCP or cardiology)  Disposition: home  Patient's condition: is Good  Follow up: 1. Dr. in 2 weeks   , PA-C [facility] [phone] 05/16/2019  11:30 AM  - For VQI Registry use ---   Post-op:  Wound infection: No  Graft infection: No  Transfusion: No    If yes, n/a units given New Arrhythmia: No Ipsilateral amputation: No, [ ]  Minor, [ ]  BKA, [ ]  AKA Discharge patency: [ ]  Primary, [x ] Primary assisted, [ ]  Secondary, [ ]  Occluded Patency judged by: [x ] Dopper only, [ ]  Palpable graft pulse, []  Palpable distal pulse, [ ]  ABI inc. > 0.15, [ ]  Duplex Discharge ABI: R 0.41, L 0.66 D/C Ambulatory Status: Ambulatory  Complications: MI: No, [ ]  Troponin only, [ ]  EKG or Clinical CHF: No Resp failure:No, [ ]  Pneumonia, [ ]  Ventilator Chg in renal function: No, [ ]  Inc. Cr > 0.5, [ ]  Temp. Dialysis,  [ ]  Permanent dialysis Stroke: No, [ ]  Minor, [ ]  Major Pt went to PV lab   Reason for return to OR: [ ]  Bleeding, [ ]  Infection, [ ]  Thrombosis, [ ]  Revision  Discharge medications: Statin use:  yes ASA use:  yes Plavix use:  no Beta blocker use: yes CCB use:  No ACEI use:   yes ARB use:  no Coumadin use: no Eliquis:  yes

## 2019-05-16 NOTE — Progress Notes (Signed)
ANTICOAGULATION CONSULT NOTE - Initial Consult  Pharmacy Consult for Eliquis Indication: afib, s/p ischemic foot   Allergies  Allergen Reactions  . Brilinta [Ticagrelor] Anaphylaxis and Other (See Comments)    Also, chest tightness  . Latex Rash    Patient Measurements: Height: 5' 5.5" (166.4 cm) Weight: 108 lb 11 oz (49.3 kg) IBW/kg (Calculated) : 58.15  Vital Signs: Temp: 97.9 F (36.6 C) (09/18 0500) Temp Source: Oral (09/18 0500) BP: 149/69 (09/18 0500) Pulse Rate: 69 (09/18 0500)  Labs: Recent Labs    05/14/19 0305 05/14/19 1002 05/15/19 0348 05/16/19 0242  HGB 9.5*  --  10.0* 9.3*  HCT 28.7*  --  30.9* 30.2*  PLT 304  --  309 357  LABPROT  --   --  14.5  --   INR  --   --  1.1  --   HEPARINUNFRC 0.23* 0.42 0.41 0.26*  CREATININE  --   --  0.80  --     Estimated Creatinine Clearance: 54.6 mL/min (by C-G formula based on SCr of 0.8 mg/dL).   Medical History: Past Medical History:  Diagnosis Date  . Abnormal LFTs    a. in the past when on Lipitor  . Anemia    as a young woman  . Anxiety   . Arnold-Chiari malformation, type I ([place])    MRI brain 02/2010  . CAD (coronary artery disease)    a. NSTEMI 07/2009: LHC - D2 40%, LAD irreg., EF 50% with apical AK (tako-tsubo CM);  b. Inf STEMI (04/2013): Tx Promus DES to CFX;  c. 08/2013 Lexi CL: No ischemia, dist ant/ap/inf-ap infarct, EF  d. 06/2016 NSTEMI with LM disease: s/p CABG on 07/17/2016 w/ LIMA-LAD, SVG-OM, and SVG-dRCA.  [name] DEPRESSION   . Dizziness    ? CVA 01/2010 - carotid dopplers with no ICA stenosis  . GERD (gastroesophageal reflux disease)   . HYPERLIPIDEMIA   . HYPERTENSION   . MI (myocardial infarction) ([place])   . PAD (peripheral artery disease) ([place])    s/p L fem pop 11/2013 in setting of 1st toe osteo/gangrene  . Paroxysmal atrial fibrillation (HCC)    a. anticoagulated with Eliquis 10/2014  . 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 RA (rheumatoid  arthritis) ([place])    prior tx by Dr. Ouida Sills  . Sjogren's syndrome ([place]) 06/02/13   pt denies this (12/01/13)  . SLE (systemic lupus erythematosus) ([place]) dx 05/2013   follows with rheum Ouida Sills)  . Tachy-brady syndrome (HCC)    a. s/p STJ Leadless pacemaker 11-04-2014 Dr Rayann Heman  . 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 Heparin for ischemic foot. IV heparin>>Eliquis today. Hgb 10>9.2. Plts WNL    Goal of Therapy:  Therapeutic oral anticoagulation   Plan:  D/c IV heparin Eliquis 5mg  BID Plan discharge today.   Gadge Hermiz S. Alford Highland, PharmD, BCPS Clinical Staff Pharmacist [phone] Mercedes [name] 05/16/2019,8:18 AM

## 2019-05-16 NOTE — TOC Transition Note (Signed)
Transition of Care [facility]) - CM/SW Discharge Note Marvetta Gibbons RN, BSN Transitions of Care Unit 4E- RN Case Manager [phone]   Patient Details  Name: Mercedes [name] MRN: [id number] Date of Birth: 02-19-1956  Transition of Care [facility]) CM/SW Contact:  Dawayne Patricia, RN Phone Number: 05/16/2019, 2:18 PM   Clinical Narrative:    Pt stable for transition home today, Per recommendations by PT orders placed for [facility] and DME needs- CM spoke with pt at bedside- list provided for [name] agency Per CMS guidelines from medicare.gov website with star ratings (copy placed in shadow chart)- per pt she does not have a preference as long as agency will take her insurance. Pt does state she needs 3n1, w/c and RW for home. Call made to [facility] with [employer] for DME needs- will have 3n1 and w/c delivered to room prior to discharge- RW to be sent to home (pt has a borrowed one at home to use until she gets new one). Referral for [facility] needs called to [place] with [facility] for HHPT/OT- referral has been accepted for [facility] services.    Final next level of care: [place] Barriers to Discharge: No Barriers Identified   Patient Goals and CMS Choice Patient states their goals for this hospitalization and ongoing recovery are:: "I'm ready to go home" CMS Medicare.gov Compare Post Acute Care list provided to:: Patient Choice offered to / list presented to : Patient  Discharge Placement  Home with [facility]                     Discharge Plan and Services   Discharge Planning Services: CM Consult Post Acute Care Choice: Durable Medical Equipment, Home Health          DME Arranged: 3-N-1, Walker rolling, Wheelchair manual DME Agency: [employer] Date DME Agency Contacted: 05/16/19 Time DME Agency Contacted: [phone] Representative spoke with at DME Agency: Thedore Mins HH Arranged: PT, OT [place] Agency: [facility] (formerly [employer]) Date [place]: 05/16/19 Time [place]: 4 Representative spoke with at [place]: [place] ([place]) Interventions     Readmission Risk Interventions Readmission Risk Prevention Plan 05/16/2019  Transportation Screening Complete  PCP or Specialist Appt within 5-7 Days Complete  Home Care Screening Complete  Medication Review (RN CM) Complete  Some recent data might be hidden

## 2019-05-16 NOTE — Progress Notes (Signed)
Orthopedic Tech Progress Note Patient Details:  Mercedes [name] April 25, 1956 [id number] Patient was really upset with everyone. Patient tried on shoe and said she would try and walk with shoe after RN comes in with pain meds. Therapist was at bedside with patient. Therapist said she would let me know if patient needs a DARCO shoe or the regular POST op would be good enough.  Ortho Devices Type of Ortho Device: Postop shoe/boot Ortho Device/Splint Location: lle Ortho Device/Splint Interventions: Adjustment, Application, Ordered   Post Interventions Patient Tolerated: Ambulated well Instructions Provided: Poper ambulation with device, Care of device, Adjustment of device   Janit Pagan 05/16/2019, 12:34 PM

## 2019-05-22 ENCOUNTER — Encounter (HOSPITAL_COMMUNITY): Payer: Self-pay | Admitting: Emergency Medicine

## 2019-05-22 ENCOUNTER — Encounter (HOSPITAL_COMMUNITY): Payer: Self-pay | Admitting: General Practice

## 2019-05-22 ENCOUNTER — Emergency Department (HOSPITAL_COMMUNITY)
Admission: EM | Admit: 2019-05-22 | Discharge: 2019-05-23 | Disposition: A | Discharge status: Home / Self Care | Payer: Medicare Other | Attending: Emergency Medicine | Admitting: Emergency Medicine

## 2019-05-22 ENCOUNTER — Other Ambulatory Visit: Payer: Self-pay

## 2019-05-22 DIAGNOSIS — Z950 Presence of cardiac pacemaker: Secondary | ICD-10-CM | POA: Diagnosis not present

## 2019-05-22 DIAGNOSIS — M79675 Pain in left toe(s): Secondary | ICD-10-CM | POA: Diagnosis present

## 2019-05-22 DIAGNOSIS — Z87891 Personal history of nicotine dependence: Secondary | ICD-10-CM | POA: Diagnosis not present

## 2019-05-22 DIAGNOSIS — I998 Other disorder of circulatory system: Secondary | ICD-10-CM | POA: Diagnosis not present

## 2019-05-22 DIAGNOSIS — M3210 Systemic lupus erythematosus, organ or system involvement unspecified: Secondary | ICD-10-CM | POA: Insufficient documentation

## 2019-05-22 DIAGNOSIS — I252 Old myocardial infarction: Secondary | ICD-10-CM | POA: Insufficient documentation

## 2019-05-22 DIAGNOSIS — I739 Peripheral vascular disease, unspecified: Secondary | ICD-10-CM | POA: Insufficient documentation

## 2019-05-22 DIAGNOSIS — Z7901 Long term (current) use of anticoagulants: Secondary | ICD-10-CM | POA: Insufficient documentation

## 2019-05-22 DIAGNOSIS — M069 Rheumatoid arthritis, unspecified: Secondary | ICD-10-CM | POA: Insufficient documentation

## 2019-05-22 DIAGNOSIS — Z91040 Latex allergy status: Secondary | ICD-10-CM | POA: Insufficient documentation

## 2019-05-22 DIAGNOSIS — Z79899 Other long term (current) drug therapy: Secondary | ICD-10-CM | POA: Diagnosis not present

## 2019-05-22 LAB — CBC WITH DIFFERENTIAL/PLATELET
Abs Immature Granulocytes: 0.02 10*3/uL (ref 0.00–0.07)
Basophils Absolute: 0.1 10*3/uL (ref 0.0–0.1)
Basophils Relative: 1 %
Eosinophils Absolute: 0.4 10*3/uL (ref 0.0–0.5)
Eosinophils Relative: 6 %
HCT: 35 % — ABNORMAL LOW (ref 36.0–46.0)
Hemoglobin: 10.7 g/dL — ABNORMAL LOW (ref 12.0–15.0)
Immature Granulocytes: 0 %
Lymphocytes Relative: 35 %
Lymphs Abs: 2.5 10*3/uL (ref 0.7–4.0)
MCH: 25.1 pg — ABNORMAL LOW (ref 26.0–34.0)
MCHC: 30.6 g/dL (ref 30.0–36.0)
MCV: 82 fL (ref 80.0–100.0)
Monocytes Absolute: 0.6 10*3/uL (ref 0.1–1.0)
Monocytes Relative: 9 %
Neutro Abs: 3.5 10*3/uL (ref 1.7–7.7)
Neutrophils Relative %: 49 %
Platelets: 519 10*3/uL — ABNORMAL HIGH (ref 150–400)
RBC: 4.27 MIL/uL (ref 3.87–5.11)
RDW: 16.8 % — ABNORMAL HIGH (ref 11.5–15.5)
WBC: 7.1 10*3/uL (ref 4.0–10.5)
nRBC: 0 % (ref 0.0–0.2)

## 2019-05-22 LAB — COMPREHENSIVE METABOLIC PANEL
ALT: 16 U/L (ref 0–44)
AST: 22 U/L (ref 15–41)
Albumin: 3.2 g/dL — ABNORMAL LOW (ref 3.5–5.0)
Alkaline Phosphatase: 87 U/L (ref 38–126)
Anion gap: 9 (ref 5–15)
BUN: 13 mg/dL (ref 8–23)
CO2: 23 mmol/L (ref 22–32)
Calcium: 9.3 mg/dL (ref 8.9–10.3)
Chloride: 101 mmol/L (ref 98–111)
Creatinine, Ser: 0.91 mg/dL (ref 0.44–1.00)
GFR calc Af Amer: >60 mL/min (ref >60)
GFR calc non Af Amer: >60 mL/min (ref >60)
Glucose, Bld: 114 mg/dL — ABNORMAL HIGH (ref 70–99)
Potassium: 4.4 mmol/L (ref 3.5–5.1)
Sodium: 133 mmol/L — ABNORMAL LOW (ref 135–145)
Total Bilirubin: 0.5 mg/dL (ref 0.3–1.2)
Total Protein: 7.8 g/dL (ref 6.5–8.1)

## 2019-05-22 MED ORDER — OXYCODONE-ACETAMINOPHEN 5-325 MG PO TABS
1.0000 | ORAL_TABLET | ORAL | Status: DC | PRN
  Administered 2019-05-22: 1 via ORAL
  Filled 2019-05-22: qty 1

## 2019-05-22 NOTE — ED Triage Notes (Addendum)
Pt states she had left fem pop on Sept 14. Pt states after surgery, her second and third toes on the left foot turned black. Pt complains of terrible pain. Pt foot is warm.

## 2019-05-23 NOTE — ED Provider Notes (Signed)
[facility] EMERGENCY DEPARTMENT Provider Note   CSN: [id number] Arrival date & time: 05/22/19  1805     History   Chief Complaint Chief Complaint  Patient presents with  . Toe Pain    HPI Mercedes [name] is a 63 y.o. female.     Patient presents to the emergency department with a chief complaint of left toe pain.  She states that today she noticed that her left second and third toes were turning black.  She is status post 11 days left femoropopliteal bypass.  She has history of left great toe amputation.  She is anticoagulated on Eliquis.  She reports moderate associated pain.  Denies any other associated symptoms.  States that she contacted vascular surgery office today, but has not heard back.  The history is provided by the patient. No language interpreter was used.    Past Medical History:  Diagnosis Date  . Abnormal LFTs    a. in the past when on Lipitor  . Anemia    as a young woman  . Anxiety   . Arnold-Chiari malformation, type I (HCC)    MRI brain 02/2010  . CAD (coronary artery disease)    a. NSTEMI 07/2009: LHC - D2 40%, LAD irreg., EF 50% with apical AK (tako-tsubo CM);  b. Inf STEMI (04/2013): Tx Promus DES to CFX;  c. 08/2013 Lexi CL: No ischemia, dist ant/ap/inf-ap infarct, EF  d. 06/2016 NSTEMI with LM disease: s/p CABG on 07/17/2016 w/ LIMA-LAD, SVG-OM, and SVG-dRCA.  [name] DEPRESSION   . Dizziness    ? CVA 01/2010 - carotid dopplers with no ICA stenosis  . GERD (gastroesophageal reflux disease)   . HYPERLIPIDEMIA   . HYPERTENSION   . MI (myocardial infarction) (HCC)   . PAD (peripheral artery disease) (HCC)    s/p L fem pop 11/2013 in setting of 1st toe osteo/gangrene  . Paroxysmal atrial fibrillation (HCC)    a. anticoagulated with Eliquis 10/2014  . 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 RA (rheumatoid arthritis) (HCC)    prior tx by Dr. Dareen Piano  . Sjogren's syndrome (HCC) 06/02/13   pt  denies this (12/01/13)  . SLE (systemic lupus erythematosus) (HCC) dx 05/2013   follows with rheum Dareen Piano)  . Tachy-brady syndrome (HCC)    a. s/p STJ Leadless pacemaker 11-04-2014 Dr Johney Frame  . 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 Protein-calorie malnutrition, severe 05/15/2019  . Critical lower limb ischemia 05/09/2019  . Dyspnea 02/01/2019  . Weight loss, unintentional 02/01/2019  . Epigastric pain 02/01/2019  . 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 Acute pyelonephritis 10/20/2018  . Acute renal failure superimposed on stage 2 chronic kidney disease (HCC) 10/20/2018  . Diabetes (HCC) 04/01/2017  . Hyperthyroidism 02/21/2017  . Low mean corpuscular volume (MCV) 09/30/2016  . Coronary artery disease involving native heart without angina pectoris   . Hx of CABG 07/17/2016  . Paroxysmal atrial fibrillation (HCC) 07/12/2016  . NSTEMI (non-ST elevated myocardial infarction) (HCC) 07/11/2016  . History of arterial bypass of lower extremity 12/30/2015  . Claudication of right lower extremity (HCC) 12/30/2015  . Phantom limb pain-Left great toe 04/29/2015  . Seizure (HCC)   . Atrial flutter with rapid ventricular response vs. SVT 10/27/2014    Class: Acute  . Atherosclerosis of native artery of  left lower extremity with rest pain (HCC) 12/18/2013  . PAD (peripheral artery disease) (HCC) 12/02/2013  . SLE (systemic lupus erythematosus) (HCC)   . CAD - S/P Takotsubo MI in 2000, CFX DES Sept 2014 05/25/2013  . Acute MI, inferior wall (HCC) 05/19/2013  . 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 Tachycardia-bradycardia (HCC) 03/09/2010  . PVD- s/p Lt [facility] April 2015 10/29/2009  . Dyslipidemia 08/26/2009  . Essential hypertension 08/26/2009  . CORONARY ATHEROSCLEROSIS NATIVE CORONARY ARTERY 08/26/2009  . 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 Rheumatoid arthritis  (HCC) 08/26/2009    Past Surgical History:  Procedure Laterality Date  . ABDOMINAL AORTAGRAM N/A 11/24/2013   Procedure: ABDOMINAL AORTAGRAM WITH BILATERAL RUNOFF WITH POSSIBLE INTERVENTION;  Surgeon: Chuck Hint, MD;  Location: [facility] CATH LAB;  Service: Cardiovascular;  Laterality: N/A;  . ABDOMINAL AORTOGRAM W/LOWER EXTREMITY N/A 04/25/2019   Procedure: ABDOMINAL AORTOGRAM W/LOWER EXTREMITY;  Surgeon: Sherren Kerns, MD;  Location: [facility] INVASIVE CV LAB;  Service: Cardiovascular;  Laterality: N/A;  . ABDOMINAL AORTOGRAM W/LOWER EXTREMITY Bilateral 05/15/2019   Procedure: ABDOMINAL AORTOGRAM W/LOWER EXTREMITY;  Surgeon: Nada Libman, MD;  Location: [facility] INVASIVE CV LAB;  Service: Cardiovascular;  Laterality: Bilateral;  . AMPUTATION Left 12/02/2013   Procedure: LEFT 1ST TOE AMPUTATION;  Surgeon: Sherren Kerns, MD;  Location: [facility] OR;  Service: Vascular;  Laterality: Left;  . CARDIAC CATHETERIZATION N/A 07/12/2016   Procedure: Left Heart Cath and Coronary Angiography;  Surgeon: Lennette Bihari, MD;  Location: [facility] INVASIVE CV LAB;  Service: Cardiovascular;  Laterality: N/A;  . CARDIAC CATHETERIZATION N/A 07/14/2016   Procedure: Intravascular Ultrasound/IVUS;  Surgeon: Yvonne Kendall, MD;  Location: [facility] INVASIVE CV LAB;  Service: Cardiovascular;  Laterality: N/A;  . COLONOSCOPY    . CORONARY ANGIOPLASTY  04/2013  . CORONARY ARTERY BYPASS GRAFT N/A 07/17/2016   Procedure: CORONARY ARTERY BYPASS GRAFTING (CABG)x3 using right greater saphenous vein harvested endoscopiclly and left internal mammary artery;  Surgeon: Kerin Perna, MD;  Location: [facility] OR;  Service: Open Heart Surgery;  Laterality: N/A;  . ENDARTERECTOMY FEMORAL Left 05/12/2019   Procedure: LEFT Femoral Endarterectomy;  Surgeon: Sherren Kerns, MD;  Location: [facility] OR;  Service: Vascular;  Laterality: Left;  . FEMORAL-POPLITEAL BYPASS GRAFT Left 12/02/2013   Procedure:  LEFT FEMORAL-BELOW KNEE POPLITEAL ARTERY BYPASS GRAFT;  Surgeon:  Sherren Kerns, MD;  Location: [facility] OR;  Service: Vascular;  Laterality: Left;  . LEFT HEART CATHETERIZATION WITH CORONARY ANGIOGRAM N/A 05/19/2013   Procedure: LEFT HEART CATHETERIZATION WITH CORONARY ANGIOGRAM;  Surgeon: Micheline Chapman, MD;  Location: [facility] CATH LAB;  Service: Cardiovascular;  Laterality: N/A;  . LEFT HEART CATHETERIZATION WITH CORONARY ANGIOGRAM N/A 10/28/2014   Procedure: LEFT HEART CATHETERIZATION WITH CORONARY ANGIOGRAM;  Surgeon: Iran Ouch, MD;  Location: [facility] CATH LAB;  Service: Cardiovascular;  Laterality: N/A;  . LOWER EXTREMITY ANGIOGRAM Left 05/12/2019   Procedure: Lower Extremity Angiogram;  Surgeon: Sherren Kerns, MD;  Location: [facility] OR;  Service: Vascular;  Laterality: Left;  . PATCH ANGIOPLASTY Left 05/12/2019   Procedure: Left Femoral Patch Angioplasty USING CADAVERIC SAPHENOUS VEIN;  Surgeon: Sherren Kerns, MD;  Location: [facility] OR;  Service: Vascular;  Laterality: Left;  . PERCUTANEOUS CORONARY STENT INTERVENTION (PCI-S)  05/19/2013   Procedure: PERCUTANEOUS CORONARY STENT INTERVENTION (PCI-S);  Surgeon: Micheline Chapman, MD;  Location: [facility] CATH LAB;  Service: Cardiovascular;;  . PERMANENT PACEMAKER INSERTION N/A 11/04/2014   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  . TEE  WITHOUT CARDIOVERSION N/A 07/17/2016   Procedure: TRANSESOPHAGEAL ECHOCARDIOGRAM (TEE);  Surgeon: Kerin Perna, MD;  Location: [facility] OR;  Service: Open Heart Surgery;  Laterality: N/A;  . THROMBECTOMY FEMORAL ARTERY Left 05/12/2019   Procedure: Left Ilio-Femoral Artery and Femoral-popliteal Graft Thrombectomy;  Surgeon: Sherren Kerns, MD;  Location: [facility] OR;  Service: Vascular;  Laterality: Left;  . TUBAL LIGATION    . VISCERAL ANGIOGRAPHY N/A 04/25/2019   Procedure: MESENTRIC  ANGIOGRAPHY;  Surgeon: Sherren Kerns, MD;  Location: [facility] INVASIVE CV LAB;  Service: Cardiovascular;  Laterality: N/A;     OB History   No obstetric history on file.      Home Medications     Prior to Admission medications   Medication Sig Start Date End Date Taking? Authorizing Provider  aspirin EC 81 MG EC tablet Take 1 tablet (81 mg total) by mouth daily. 05/16/19   Rhyne, Samantha J, PA-C  ELIQUIS 5 MG TABS tablet TAKE 1 TABLET BY MOUTH TWICE DAILY Patient taking differently: Take 5 mg by mouth 2 (two) times daily.  04/21/19   [name] K, NP  lisinopril (ZESTRIL) 10 MG tablet Take 1 tablet (10 mg total) by mouth daily. 05/07/19   Ronney Asters, NP  metoprolol tartrate (LOPRESSOR) 25 MG tablet Take 0.5 tablets (12.5 mg total) by mouth 2 (two) times daily. 04/24/18   Lewayne Bunting, MD  nitroGLYCERIN (NITROSTAT) 0.4 MG SL tablet Place 1 tablet (0.4 mg total) under the tongue every 5 (five) minutes as needed for chest pain. 10/09/18   [name] K, NP  oxyCODONE-acetaminophen (PERCOCET/ROXICET) 5-325 MG tablet Take 1 tablet by mouth every 6 (six) hours as needed for severe pain. 05/16/19   Rhyne, Ames Coupe, PA-C  rosuvastatin (CRESTOR) 10 MG tablet Take 1 tablet (10 mg total) by mouth daily at 6 PM. 05/16/19   Rhyne, Ames Coupe, PA-C    Family History Family History  Problem Relation Age of Onset  . Arthritis Mother   . Emphysema Mother   . Arthritis Father   . COPD Father   . Heart disease Father   . Diabetes Paternal Aunt        paternal Aunts  . Heart disease Paternal Aunt   . Thyroid disease Neg Hx     Social History Social History   Tobacco Use  . Smoking status: Former Smoker    Types: Cigarettes    Quit date: 05/19/2016    Years since quitting: 3.0  . Smokeless tobacco: Never Used  . Tobacco comment:    Substance Use Topics  . Alcohol use: No    Alcohol/week: 0.0 standard drinks    Frequency: Never  . Drug use: No     Allergies   Brilinta [ticagrelor] and Latex   Review of Systems Review of Systems  All other systems reviewed and are negative.    Physical Exam Updated Vital Signs BP 127/73 (BP Location: Right Arm)   Pulse 60    Temp 98 F (36.7 C) (Oral)   Resp 16   SpO2 98%   Physical Exam Vitals signs and nursing note reviewed.  Constitutional:      General: She is not in acute distress.    Appearance: She is well-developed.  HENT:     Head: Normocephalic and atraumatic.  Eyes:     Conjunctiva/sclera: Conjunctivae normal.  Neck:     Musculoskeletal: Neck supple.  Cardiovascular:     Rate and Rhythm: Normal rate and regular rhythm.  Heart sounds: No murmur.     Comments: DP and PT pulses are dopplerable Pulmonary:     Effort: Pulmonary effort is normal. No respiratory distress.     Breath sounds: Normal breath sounds.  Abdominal:     Palpations: Abdomen is soft.     Tenderness: There is no abdominal tenderness.  Skin:    General: Skin is warm and dry.  Neurological:     Mental Status: She is alert and oriented to person, place, and time.  Psychiatric:        Mood and Affect: Mood normal.        Behavior: Behavior normal.        ED Treatments / Results  Labs (all labs ordered are listed, but only abnormal results are displayed) Labs Reviewed  COMPREHENSIVE METABOLIC PANEL - Abnormal; Notable for the following components:      Result Value   Sodium 133 (*)    Glucose, Bld 114 (*)    Albumin 3.2 (*)    All other components within normal limits  CBC WITH DIFFERENTIAL/PLATELET - Abnormal; Notable for the following components:   Hemoglobin 10.7 (*)    HCT 35.0 (*)    MCH 25.1 (*)    RDW 16.8 (*)    Platelets 519 (*)    All other components within normal limits    EKG None  Radiology No results found.  Procedures Procedures (including critical care time)  Medications Ordered in ED Medications  oxyCODONE-acetaminophen (PERCOCET/ROXICET) 5-325 MG per tablet 1 tablet (1 tablet Oral Given 05/22/19 1821)     Initial Impression / Assessment and Plan / ED Course  I have reviewed the triage vital signs and the nursing notes.  Pertinent labs & imaging results that were  available during my care of the patient were reviewed by me and considered in my medical decision making (see chart for details).        Patient with recent femoropopliteal bypass of the left lower extremity.  Here with ischemic-looking second and third toes.  Great toe has been amputated previously.  Patient has excellent pulses in the posterior tibialis and dorsalis pedis.  Case discussed with vascular surgery, Dr. Carlis Abbott, who will come to evaluate the patient.  4:08 AM Dr. Carlis Abbott recommends discharge at this time.  Patient's pain is well controlled.  There is no evidence of infection.  He states that he has no concerns from the prior revascularization surgeries.  He states that she can follow-up in the office.  Patient understands and agrees with this plan.  She is stable and ready for discharge.  Final Clinical Impressions(s) / ED Diagnoses   Final diagnoses:  Ischemic toe    ED Discharge Orders    None       Montine Circle, PA-C 24/40/10 [id number]    Delora Fuel, MD 36/64/40 [phone]

## 2019-05-23 NOTE — ED Notes (Signed)
Clark Md at bedside.

## 2019-05-23 NOTE — Consult Note (Signed)
Hospital Consult    Reason for Consult: Dusky left second and third toes after recent revascularization Referring Physician: ED MRN #:  [id number]  History of Present Illness: This is a 63 y.o. female with multiple medical problems that vascular surgery has been consulted with concern for dusky appearance of the left second and third toes.  Patient is well-known to the vascular surgery service.  She previously underwent a femoropopliteal bypass and first toe amp by Dr. Darrick Penna in 2015.  Subsequently she developed ischemia of the left foot and had a thrombectomy of the left femoropopliteal bypass with patch angioplasty of the left common femoral artery on 05/12/2019.  She did well postop and was ultimately discharged home.  She did have angiogram during admission with peroneal runoff.  She comes to the ED tonight stating that she noticed her second and third toes became discolored about 2 or 3 days ago on the left foot.  She states her pain is fairly minimal.  She just became concerned about the discoloration.  Overall her leg feels much better since revascularization with Dr. Darrick Penna.  Past Medical History:  Diagnosis Date   Abnormal LFTs    a. in the past when on Lipitor   Anemia    as a young woman   Anxiety    Arnold-Chiari malformation, type I (HCC)    MRI brain 02/2010   CAD (coronary artery disease)    a. NSTEMI 07/2009: LHC - D2 40%, LAD irreg., EF 50% with apical AK (tako-tsubo CM);  b. Inf STEMI (04/2013): Tx Promus DES to CFX;  c. 08/2013 Lexi CL: No ischemia, dist ant/ap/inf-ap infarct, EF  d. 06/2016 NSTEMI with LM disease: s/p CABG on 07/17/2016 w/ LIMA-LAD, SVG-OM, and SVG-dRCA.   DEPRESSION    Dizziness    ? CVA 01/2010 - carotid dopplers with no ICA stenosis   GERD (gastroesophageal reflux disease)    HYPERLIPIDEMIA    HYPERTENSION    MI (myocardial infarction) (HCC)    PAD (peripheral artery disease) (HCC)    s/p L fem pop 11/2013 in setting of 1st toe  osteo/gangrene   Paroxysmal atrial fibrillation (HCC)    a. anticoagulated with Eliquis 10/2014   Presence of permanent cardiac pacemaker 10/2014   leadless permanent pacemaker   RA (rheumatoid arthritis) (HCC)    prior tx by Dr. Dareen Piano   Sjogren's syndrome (HCC) 06/02/13   pt denies this (12/01/13)   SLE (systemic lupus erythematosus) (HCC) dx 05/2013   follows with rheum Dareen Piano)   Tachy-brady syndrome [facility])    a. 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   f/u echo 09/2009: EF 50-55%, mild LVH, mod diast dysfxn, mild apical HK    Past Surgical History:  Procedure Laterality Date   ABDOMINAL AORTAGRAM N/A 11/24/2013   Procedure: ABDOMINAL AORTAGRAM WITH BILATERAL RUNOFF WITH POSSIBLE INTERVENTION;  Surgeon: Chuck Hint, MD;  Location: [facility] CATH LAB;  Service: Cardiovascular;  Laterality: N/A;   ABDOMINAL AORTOGRAM W/LOWER EXTREMITY N/A 04/25/2019   Procedure: ABDOMINAL AORTOGRAM W/LOWER EXTREMITY;  Surgeon: Sherren Kerns, MD;  Location: [facility] INVASIVE CV LAB;  Service: Cardiovascular;  Laterality: N/A;   ABDOMINAL AORTOGRAM W/LOWER EXTREMITY Bilateral 05/15/2019   Procedure: ABDOMINAL AORTOGRAM W/LOWER EXTREMITY;  Surgeon: Nada Libman, MD;  Location: [facility] INVASIVE CV LAB;  Service: Cardiovascular;  Laterality: Bilateral;   AMPUTATION Left 12/02/2013   Procedure: LEFT 1ST TOE AMPUTATION;  Surgeon: Sherren Kerns, MD;  Location: [facility] OR;  Service: Vascular;  Laterality: Left;   CARDIAC CATHETERIZATION N/A 07/12/2016   Procedure: Left Heart Cath and Coronary Angiography;  Surgeon: Troy Sine, MD;  Location: [place] CV LAB;  Service: Cardiovascular;  Laterality: N/A;   CARDIAC CATHETERIZATION N/A 07/14/2016   Procedure: Intravascular Ultrasound/IVUS;  Surgeon: Nelva Bush, MD;  Location: [place] CV LAB;  Service: Cardiovascular;  Laterality: N/A;   COLONOSCOPY     CORONARY ANGIOPLASTY  04/2013   CORONARY ARTERY  BYPASS GRAFT N/A 07/17/2016   Procedure: CORONARY ARTERY BYPASS GRAFTING (CABG)x3 using right greater saphenous vein harvested endoscopiclly and left internal mammary artery;  Surgeon: Ivin Poot, MD;  Location: [place];  Service: Open Heart Surgery;  Laterality: N/A;   ENDARTERECTOMY FEMORAL Left 05/12/2019   Procedure: LEFT Femoral Endarterectomy;  Surgeon: Elam Dutch, MD;  Location: [place];  Service: Vascular;  Laterality: Left;   FEMORAL-POPLITEAL BYPASS GRAFT Left 12/02/2013   Procedure:  LEFT FEMORAL-BELOW KNEE POPLITEAL ARTERY BYPASS GRAFT;  Surgeon: Elam Dutch, MD;  Location: [place];  Service: Vascular;  Laterality: Left;   LEFT HEART CATHETERIZATION WITH CORONARY ANGIOGRAM N/A 05/19/2013   Procedure: LEFT HEART CATHETERIZATION WITH CORONARY ANGIOGRAM;  Surgeon: Blane Ohara, MD;  Location: [facility] CATH LAB;  Service: Cardiovascular;  Laterality: N/A;   LEFT HEART CATHETERIZATION WITH CORONARY ANGIOGRAM N/A 10/28/2014   Procedure: LEFT HEART CATHETERIZATION WITH CORONARY ANGIOGRAM;  Surgeon: Wellington Hampshire, MD;  Location: [place] CATH LAB;  Service: Cardiovascular;  Laterality: N/A;   LOWER EXTREMITY ANGIOGRAM Left 05/12/2019   Procedure: Lower Extremity Angiogram;  Surgeon: Elam Dutch, MD;  Location: [facility] OR;  Service: Vascular;  Laterality: Left;   PATCH ANGIOPLASTY Left 05/12/2019   Procedure: Left Femoral Patch Angioplasty USING CADAVERIC SAPHENOUS VEIN;  Surgeon: Elam Dutch, MD;  Location: [facility] OR;  Service: Vascular;  Laterality: Left;   PERCUTANEOUS CORONARY STENT INTERVENTION (PCI-S)  05/19/2013   Procedure: PERCUTANEOUS CORONARY STENT INTERVENTION (PCI-S);  Surgeon: Blane Ohara, MD;  Location: [facility] CATH LAB;  Service: Cardiovascular;;   PERMANENT PACEMAKER INSERTION N/A 11/04/2014   STJ Leadless pacemaker implanted by Dr Rayann Heman for tachy/brady   TEE WITHOUT CARDIOVERSION N/A 07/17/2016   Procedure: TRANSESOPHAGEAL ECHOCARDIOGRAM (TEE);  Surgeon: Ivin Poot, MD;  Location: [place];  Service: Open Heart Surgery;  Laterality: N/A;   THROMBECTOMY FEMORAL ARTERY Left 05/12/2019   Procedure: Left Ilio-Femoral Artery and Femoral-popliteal Graft Thrombectomy;  Surgeon: Elam Dutch, MD;  Location: [facility] OR;  Service: Vascular;  Laterality: Left;   TUBAL LIGATION     VISCERAL ANGIOGRAPHY N/A 04/25/2019   Procedure: MESENTRIC  ANGIOGRAPHY;  Surgeon: Elam Dutch, MD;  Location: [place] CV LAB;  Service: Cardiovascular;  Laterality: N/A;    Allergies  Allergen Reactions   Brilinta [Ticagrelor] Anaphylaxis and Other (See Comments)    Also, chest tightness   Latex Rash    Prior to Admission medications   Medication Sig Start Date End Date Taking? Authorizing Provider  aspirin EC 81 MG EC tablet Take 1 tablet (81 mg total) by mouth daily. 05/16/19   Rhyne, Samantha J, PA-C  ELIQUIS 5 MG TABS tablet TAKE 1 TABLET BY MOUTH TWICE DAILY Patient taking differently: Take 5 mg by mouth 2 (two) times daily.  04/21/19   Chanetta Marshall K, NP  lisinopril (ZESTRIL) 10 MG tablet Take 1 tablet (10 mg total) by mouth daily. 05/07/19   Deberah Pelton, NP  metoprolol tartrate (LOPRESSOR) 25 MG tablet Take 0.5 tablets (12.5  mg total) by mouth 2 (two) times daily. 04/24/18   Lewayne Bunting, MD  nitroGLYCERIN (NITROSTAT) 0.4 MG SL tablet Place 1 tablet (0.4 mg total) under the tongue every 5 (five) minutes as needed for chest pain. 10/09/18   [name] K, NP  oxyCODONE-acetaminophen (PERCOCET/ROXICET) 5-325 MG tablet Take 1 tablet by mouth every 6 (six) hours as needed for severe pain. 05/16/19   Rhyne, Ames Coupe, PA-C  rosuvastatin (CRESTOR) 10 MG tablet Take 1 tablet (10 mg total) by mouth daily at 6 PM. 05/16/19   Rhyne, Ames Coupe, PA-C    Social History   Socioeconomic History   Marital status: Single    Spouse name: Not on file   Number of children: 3   Years of education: Not on file   Highest education level: Not on file  Occupational  History   Occupation: retired  Ecologist strain: Not on file   Food insecurity    Worry: Not on file    Inability: Not on file   Transportation needs    Medical: Not on file    Non-medical: Not on file  Tobacco Use   Smoking status: Former Smoker    Types: Cigarettes    Quit date: 05/19/2016    Years since quitting: 3.0   Smokeless tobacco: Never Used   Tobacco comment:    Substance and Sexual Activity   Alcohol use: No    Alcohol/week: 0.0 standard drinks    Frequency: Never   Drug use: No   Sexual activity: Not on file  Lifestyle   Physical activity    Days per week: Not on file    Minutes per session: Not on file   Stress: Not on file  Relationships   Social connections    Talks on phone: Not on file    Gets together: Not on file    Attends religious service: Not on file    Active member of club or organization: Not on file    Attends meetings of clubs or organizations: Not on file    Relationship status: Not on file   Intimate partner violence    Fear of current or ex partner: Not on file    Emotionally abused: Not on file    Physically abused: Not on file    Forced sexual activity: Not on file  Other Topics Concern   Not on file  Social History Narrative   Lives alone.     Family History  Problem Relation Age of Onset   Arthritis Mother    Emphysema Mother    Arthritis Father    COPD Father    Heart disease Father    Diabetes Paternal Aunt        paternal Aunts   Heart disease Paternal Aunt    Thyroid disease Neg Hx     ROS: [x]  Positive   [ ]  Negative   [ ]  All sytems reviewed and are negative  Cardiovascular: []  chest pain/pressure []  palpitations []  SOB lying flat []  DOE []  pain in legs while walking []  pain in legs at rest []  pain in legs at night []  non-healing ulcers []  hx of DVT []  swelling in legs  Pulmonary: []  productive cough []  asthma/wheezing []  home O2  Neurologic: []   weakness in []  arms []  legs []  numbness in []  arms []  legs []  hx of CVA []  mini stroke [] difficulty speaking or slurred speech []  temporary loss of vision in one eye []   dizziness  Hematologic: []  hx of cancer []  bleeding problems []  problems with blood clotting easily  Endocrine:   []  diabetes []  thyroid disease  GI []  vomiting blood []  blood in stool  GU: []  CKD/renal failure []  HD--[]  M/W/F or []  T/T/S []  burning with urination []  blood in urine  Psychiatric: []  anxiety []  depression  Musculoskeletal: []  arthritis []  joint pain  Integumentary: []  rashes []  ulcers  Constitutional: []  fever []  chills   Physical Examination  Vitals:   05/23/19 0345 05/23/19 0400  BP: 104/65 120/72  Pulse: 67 71  Resp: 16 17  Temp:    SpO2: 97% 96%   There is no height or weight on file to calculate BMI.  General:  WDWN in NAD Gait: Not observed HENT: WNL, normocephalic Pulmonary: normal non-labored breathing, without Rales, rhonchi,  wheezing Vascular Exam/Pulses: Left DP/PT signals brisk by doppler, foot warm, motor and sensory intact Extremities Dusky appearance to left second toe tip, third toe with some mild discoloration Musculoskeletal: no muscle wasting or atrophy  Neurologic: A&O X 3; Appropriate Affect ; SENSATION: normal; MOTOR FUNCTION:  moving all extremities equally. Speech is fluent/normal      CBC    Component Value Date/Time   WBC 7.1 05/22/2019 1816   RBC 4.27 05/22/2019 1816   HGB 10.7 (L) 05/22/2019 1816   HGB 14.2 10/09/2018 1125   HCT 35.0 (L) 05/22/2019 1816   HCT 44.6 10/09/2018 1125   PLT 519 (H) 05/22/2019 1816   PLT 289 10/09/2018 1125   MCV 82.0 05/22/2019 1816   MCV 79 10/09/2018 1125   MCH 25.1 (L) 05/22/2019 1816   MCHC 30.6 05/22/2019 1816   RDW 16.8 (H) 05/22/2019 1816   RDW 14.8 10/09/2018 1125   LYMPHSABS 2.5 05/22/2019 1816   MONOABS 0.6 05/22/2019 1816   EOSABS 0.4 05/22/2019 1816   BASOSABS 0.1 05/22/2019 1816     BMET    Component Value Date/Time   NA 133 (L) 05/22/2019 1816   NA 135 10/09/2018 1125   K 4.4 05/22/2019 1816   CL 101 05/22/2019 1816   CO2 23 05/22/2019 1816   GLUCOSE 114 (H) 05/22/2019 1816   BUN 13 05/22/2019 1816   BUN 10 10/09/2018 1125   CREATININE 0.91 05/22/2019 1816   CREATININE 1.13 (H) 08/03/2016 1440   CALCIUM 9.3 05/22/2019 1816   GFRNONAA >60 05/22/2019 1816   GFRAA >60 05/22/2019 1816    COAGS: Lab Results  Component Value Date   INR 1.1 05/15/2019   INR 1.2 05/12/2019   INR 1.1 05/09/2019     Non-Invasive Vascular Imaging:    None   ASSESSMENT/PLAN: This is a 63 y.o. female that presents with dusky appearing left second and third toes after recent revascularization.  As previously noted she had a left femoral to below-knee popliteal bypass in 2015 by Dr. Darrick Pennafields and then recently developed ischemia and underwent thrombectomy of the bypass with patch angioplasty of the common femoral on 05/12/19.  On exam she has very brisk posterior tibial and dorsalis pedis signals in the left foot.  Her foot is very warm.  I think her revascularization remains patent and she states the leg feels much better.  Discussed in detail that she may end up requiring toe amputations of the left second and/or third toes.  Certainly would give this time to demarcate.  She has no signs of infection or wet gangrene that would warrant admission at this time.  In addition she says her pain  is minimal and well controlled with her oral narcotics at home and I do not think she needs admission for pain control.  I will arrange interval follow-up with Dr. Darrick Penna in the next 1-2 weeks to ensure that we are closely monitoring foot in case she needs toe amps in the near future.  She is very comfortable with this plan.  Cephus Shelling, MD [facility] Office: [phone] Pager: [phone]

## 2019-05-23 NOTE — Discharge Instructions (Addendum)
You need to follow-up with Dr. Oneida Alar in his office.  Return for fever, uncontrolled pain, or anything concerning for infection.

## 2019-05-23 NOTE — ED Notes (Signed)
Per PA, hold Covid swab and IV until pt seen by Vascular.

## 2019-05-27 ENCOUNTER — Telehealth: Payer: Self-pay | Admitting: Internal Medicine

## 2019-05-27 DIAGNOSIS — Z9889 Other specified postprocedural states: Secondary | ICD-10-CM

## 2019-05-27 NOTE — Telephone Encounter (Signed)
Izora Gala - OT with [facility] is requesting a Transfer tub bench for pt. Pt had surgery, Izora Gala tried requesting Rx from surgeon but was advised to reach out to PCP to request instead.    Fax: [phone] Fax number to sent Rx to.   Phone: [phone]

## 2019-05-28 ENCOUNTER — Ambulatory Visit (INDEPENDENT_AMBULATORY_CARE_PROVIDER_SITE_OTHER): Payer: Self-pay | Admitting: Physician Assistant

## 2019-05-28 ENCOUNTER — Other Ambulatory Visit: Payer: Self-pay

## 2019-05-28 VITALS — BP 144/84 | HR 64 | Temp 98.1°F | Resp 12 | Ht 66.0 in | Wt 108.3 lb

## 2019-05-28 DIAGNOSIS — I739 Peripheral vascular disease, unspecified: Secondary | ICD-10-CM

## 2019-05-28 NOTE — Telephone Encounter (Signed)
Rx sent to [facility].  

## 2019-05-28 NOTE — Progress Notes (Signed)
    Postoperative Visit   History of Present Illness   Mercedes [name] is a 63 y.o. year old female who presents for postoperative follow-up.  Patient was found to have a occluded left femoral-popliteal bypass on angiography.  She then underwent Thrombectomy left femoropopliteal bypass patch angioplasty proximal left femoropopliteal and common femoral artery, thrombectomy left external iliac artery, profunda, superficial femoral artery by Dr. Oneida Alar on 05/12/2019.  She has gangrenous changes to her left second and third toes however denies any wet gangrene or purulent drainage.  She reports a complete resolution of rest pain.  She still has some edema and some drainage from below the knee popliteal incision however incisions are nearly completely healed.  She is taking Eliquis daily.   For VQI Use Only   PRE-ADM LIVING: Home  AMB STATUS: Ambulatory   Physical Examination   Vitals:   05/28/19 1552  BP: (!) 144/84  Pulse: 64  Resp: 12  Temp: 98.1 F (36.7 C)  TempSrc: Temporal  SpO2: 100%  Weight: 108 lb 4.8 oz (49.1 kg)  Height: 5\' 6"  (1.676 m)    LLE: Incisions are healing well, some serous drainage left below the knee popliteal incision with manipulation; left brisk DP and PT by Doppler; right DP by Doppler; dry gangrene left second toe tip, and dusky appearing third toe, no purulence or wet gangrene   Medical Decision Making   Mercedes [name] is a 63 y.o. year old female who presents s/p Thrombectomy left femoropopliteal bypass patch angioplasty proximal left femoropopliteal and common femoral artery, thrombectomy left external iliac artery, profunda, superficial femoral artery 2 weeks postoperatively.  . The left lower extremity is well perfused with brisk DP and PT signals by Doppler . Left second and third toe gangrene is dry with no obvious sign of infection . Plan will be to monitor toes of left foot and ideally left second toe will auto amputate . She will return  to office in 2 to 4 weeks for bypass surveillance and ABIs . Continue Eliquis . Call/return to office sooner if rest pain or further tissue changes develop   Dagoberto Ligas PA-C [facility] Office: [phone]  Clinic MD: Dr. Oneida Alar

## 2019-06-05 ENCOUNTER — Encounter: Payer: Medicare Other | Admitting: Vascular Surgery

## 2019-06-05 ENCOUNTER — Telehealth: Payer: Self-pay | Admitting: Internal Medicine

## 2019-06-05 NOTE — Telephone Encounter (Signed)
Doris was suppose to see Pt for social work but can not fit in schedule this week and will be moving the visit to next week

## 2019-06-05 NOTE — Telephone Encounter (Signed)
Noted  

## 2019-06-18 ENCOUNTER — Other Ambulatory Visit: Payer: Self-pay

## 2019-06-18 ENCOUNTER — Telehealth (HOSPITAL_COMMUNITY): Payer: Self-pay | Admitting: *Deleted

## 2019-06-18 DIAGNOSIS — I739 Peripheral vascular disease, unspecified: Secondary | ICD-10-CM

## 2019-06-18 NOTE — Telephone Encounter (Signed)

## 2019-06-19 ENCOUNTER — Encounter: Payer: Self-pay | Admitting: Vascular Surgery

## 2019-06-19 ENCOUNTER — Ambulatory Visit (HOSPITAL_COMMUNITY)
Admission: RE | Admit: 2019-06-19 | Discharge: 2019-06-19 | Disposition: A | Discharge status: Home / Self Care | Payer: Medicare Other | Source: Ambulatory Visit | Attending: Vascular Surgery | Admitting: Vascular Surgery

## 2019-06-19 ENCOUNTER — Ambulatory Visit (INDEPENDENT_AMBULATORY_CARE_PROVIDER_SITE_OTHER): Payer: Self-pay | Admitting: Vascular Surgery

## 2019-06-19 ENCOUNTER — Other Ambulatory Visit: Payer: Self-pay

## 2019-06-19 ENCOUNTER — Ambulatory Visit (INDEPENDENT_AMBULATORY_CARE_PROVIDER_SITE_OTHER)
Admission: RE | Admit: 2019-06-19 | Discharge: 2019-06-19 | Disposition: A | Discharge status: Home / Self Care | Payer: Medicare Other | Source: Ambulatory Visit | Attending: Vascular Surgery | Admitting: Vascular Surgery

## 2019-06-19 ENCOUNTER — Ambulatory Visit: Payer: Medicare Other | Admitting: Vascular Surgery

## 2019-06-19 VITALS — BP 130/83 | HR 64 | Temp 97.7°F | Resp 20 | Ht 66.0 in | Wt 110.0 lb

## 2019-06-19 DIAGNOSIS — I739 Peripheral vascular disease, unspecified: Secondary | ICD-10-CM

## 2019-06-19 NOTE — Progress Notes (Signed)
Patient is a 63 year old female who returns for follow-up today.  She underwent thrombectomy of a pre-existing left femoral to below-knee popliteal bypass on May 02, 2019.  Her original left femoral to below-knee popliteal bypass with vein was done in 2015.  She has early gangrenous changes of 2 toes.  She has some occasional pain in these but does not currently wish to have the toes amputated.  She states she has quit smoking.  She is on Eliquis for paroxysmal atrial fibrillation.  She is also on aspirin and a statin.  Past Surgical History:  Procedure Laterality Date  . ABDOMINAL AORTAGRAM N/A 11/24/2013   Procedure: ABDOMINAL AORTAGRAM WITH BILATERAL RUNOFF WITH POSSIBLE INTERVENTION;  Surgeon: Angelia Mould, MD;  Location: [facility] CATH LAB;  Service: Cardiovascular;  Laterality: N/A;  . ABDOMINAL AORTOGRAM W/LOWER EXTREMITY N/A 04/25/2019   Procedure: ABDOMINAL AORTOGRAM W/LOWER EXTREMITY;  Surgeon: Elam Dutch, MD;  Location: [place] CV LAB;  Service: Cardiovascular;  Laterality: N/A;  . ABDOMINAL AORTOGRAM W/LOWER EXTREMITY Bilateral 05/15/2019   Procedure: ABDOMINAL AORTOGRAM W/LOWER EXTREMITY;  Surgeon: Serafina Mitchell, MD;  Location: [place] CV LAB;  Service: Cardiovascular;  Laterality: Bilateral;  . AMPUTATION Left 12/02/2013   Procedure: LEFT 1ST TOE AMPUTATION;  Surgeon: Elam Dutch, MD;  Location: [place];  Service: Vascular;  Laterality: Left;  . CARDIAC CATHETERIZATION N/A 07/12/2016   Procedure: Left Heart Cath and Coronary Angiography;  Surgeon: Troy Sine, MD;  Location: [place] CV LAB;  Service: Cardiovascular;  Laterality: N/A;  . CARDIAC CATHETERIZATION N/A 07/14/2016   Procedure: Intravascular Ultrasound/IVUS;  Surgeon: Nelva Bush, MD;  Location: [place] CV LAB;  Service: Cardiovascular;  Laterality: N/A;  . COLONOSCOPY    . CORONARY ANGIOPLASTY  04/2013  . CORONARY ARTERY BYPASS GRAFT N/A 07/17/2016   Procedure: CORONARY ARTERY BYPASS  GRAFTING (CABG)x3 using right greater saphenous vein harvested endoscopiclly and left internal mammary artery;  Surgeon: Ivin Poot, MD;  Location: [place];  Service: Open Heart Surgery;  Laterality: N/A;  . ENDARTERECTOMY FEMORAL Left 05/12/2019   Procedure: LEFT Femoral Endarterectomy;  Surgeon: Elam Dutch, MD;  Location: [place];  Service: Vascular;  Laterality: Left;  . FEMORAL-POPLITEAL BYPASS GRAFT Left 12/02/2013   Procedure:  LEFT FEMORAL-BELOW KNEE POPLITEAL ARTERY BYPASS GRAFT;  Surgeon: Elam Dutch, MD;  Location: [place];  Service: Vascular;  Laterality: Left;  . LEFT HEART CATHETERIZATION WITH CORONARY ANGIOGRAM N/A 05/19/2013   Procedure: LEFT HEART CATHETERIZATION WITH CORONARY ANGIOGRAM;  Surgeon: Blane Ohara, MD;  Location: [facility] CATH LAB;  Service: Cardiovascular;  Laterality: N/A;  . LEFT HEART CATHETERIZATION WITH CORONARY ANGIOGRAM N/A 10/28/2014   Procedure: LEFT HEART CATHETERIZATION WITH CORONARY ANGIOGRAM;  Surgeon: Wellington Hampshire, MD;  Location: [place] CATH LAB;  Service: Cardiovascular;  Laterality: N/A;  . LOWER EXTREMITY ANGIOGRAM Left 05/12/2019   Procedure: Lower Extremity Angiogram;  Surgeon: Elam Dutch, MD;  Location: [place];  Service: Vascular;  Laterality: Left;  . PATCH ANGIOPLASTY Left 05/12/2019   Procedure: Left Femoral Patch Angioplasty USING CADAVERIC SAPHENOUS VEIN;  Surgeon: Elam Dutch, MD;  Location: [place];  Service: Vascular;  Laterality: Left;  . PERCUTANEOUS CORONARY STENT INTERVENTION (PCI-S)  05/19/2013   Procedure: PERCUTANEOUS CORONARY STENT INTERVENTION (PCI-S);  Surgeon: Blane Ohara, MD;  Location: [facility] CATH LAB;  Service: Cardiovascular;;  . PERMANENT PACEMAKER INSERTION N/A 11/04/2014   STJ Leadless pacemaker implanted by Dr Rayann Heman for tachy/brady  . TEE WITHOUT CARDIOVERSION N/A  07/17/2016   Procedure: TRANSESOPHAGEAL ECHOCARDIOGRAM (TEE);  Surgeon: Kerin Perna, MD;  Location: [facility] OR;  Service: Open Heart Surgery;   Laterality: N/A;  . THROMBECTOMY FEMORAL ARTERY Left 05/12/2019   Procedure: Left Ilio-Femoral Artery and Femoral-popliteal Graft Thrombectomy;  Surgeon: Sherren Kerns, MD;  Location: [facility] OR;  Service: Vascular;  Laterality: Left;  . TUBAL LIGATION    . VISCERAL ANGIOGRAPHY N/A 04/25/2019   Procedure: MESENTRIC  ANGIOGRAPHY;  Surgeon: Sherren Kerns, MD;  Location: [facility] INVASIVE CV LAB;  Service: Cardiovascular;  Laterality: N/A;    Past Medical History:  Diagnosis Date  . Abnormal LFTs    a. in the past when on Lipitor  . Anemia    as a young woman  . Anxiety   . Arnold-Chiari malformation, type I (HCC)    MRI brain 02/2010  . CAD (coronary artery disease)    a. NSTEMI 07/2009: LHC - D2 40%, LAD irreg., EF 50% with apical AK (tako-tsubo CM);  b. Inf STEMI (04/2013): Tx Promus DES to CFX;  c. 08/2013 Lexi CL: No ischemia, dist ant/ap/inf-ap infarct, EF  d. 06/2016 NSTEMI with LM disease: s/p CABG on 07/17/2016 w/ LIMA-LAD, SVG-OM, and SVG-dRCA.  [name] DEPRESSION   . Dizziness    ? CVA 01/2010 - carotid dopplers with no ICA stenosis  . GERD (gastroesophageal reflux disease)   . HYPERLIPIDEMIA   . HYPERTENSION   . MI (myocardial infarction) (HCC)   . PAD (peripheral artery disease) (HCC)    s/p L fem pop 11/2013 in setting of 1st toe osteo/gangrene  . Paroxysmal atrial fibrillation (HCC)    a. anticoagulated with Eliquis 10/2014  . 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 RA (rheumatoid arthritis) (HCC)    prior tx by Dr. Dareen Piano  . Sjogren's syndrome (HCC) 06/02/13   pt denies this (12/01/13)  . SLE (systemic lupus erythematosus) (HCC) dx 05/2013   follows with rheum Dareen Piano)  . Tachy-brady syndrome (HCC)    a. s/p STJ Leadless pacemaker 11-04-2014 Dr Johney Frame  . 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  Velocities were 472 cm/s.  This is consistent with greater than 75% stenosis.  Outflow was in good condition with no significant flow-limiting stenosis throughout the femoropopliteal.  ABIs today were 0.72 on the left 0.6 on the right she has known right superficial femoral artery occlusion.  I also reviewed her recent arteriogram which was only a few weeks ago.  This shows probably a 50% stenosis of the left common femoral artery.  I doubt this is significantly changed in the last few weeks.  Assessment: Patient with patent left femoral to below-knee popliteal bypass to 50% left common femoral artery stenosis will need close surveillance.  Dry gangrenous changes of toes 2 and 3 on the left foot.  I offered the patient amputation of these today and she prefers to continue to watch them for now.  Plan: The patient will  have a follow-up duplex ultrasound and ABIs in 3 months time.  If the velocities continue to increase she would maybe need another arteriogram.  Patient will be scheduled for toe amputation in the future if she has worsening pain or changes to a wet infectious type picture.  Fabienne Bruns, MD [facility] Office: [phone]

## 2019-07-10 ENCOUNTER — Ambulatory Visit: Payer: Medicare Other | Admitting: Physician Assistant

## 2019-07-10 ENCOUNTER — Other Ambulatory Visit: Payer: Self-pay | Admitting: Physician Assistant

## 2019-07-10 ENCOUNTER — Other Ambulatory Visit: Payer: Self-pay

## 2019-07-10 ENCOUNTER — Encounter: Payer: Self-pay | Admitting: Physician Assistant

## 2019-07-10 ENCOUNTER — Ambulatory Visit (INDEPENDENT_AMBULATORY_CARE_PROVIDER_SITE_OTHER): Payer: Medicare Other | Admitting: Orthopedic Surgery

## 2019-07-10 VITALS — Ht 66.0 in | Wt 110.0 lb

## 2019-07-10 DIAGNOSIS — I96 Gangrene, not elsewhere classified: Secondary | ICD-10-CM

## 2019-07-10 DIAGNOSIS — I739 Peripheral vascular disease, unspecified: Secondary | ICD-10-CM

## 2019-07-10 NOTE — Progress Notes (Signed)
Office Visit Note   Patient: Mercedes [name]           Date of Birth: 1955-12-20           MRN: [id number] Visit Date: 07/10/2019              Requested by: Binnie Rail, MD [place],  [place] [id number] PCP: Binnie Rail, MD  Chief Complaint  Patient presents with  . Left Foot - Pain      HPI: Patient is a 63 year old woman who was seen in consultation for initial evaluation for left foot with gangrene of toes 234 and 5 of the left foot status post partial great toe amputation.  Patient has undergone revascularization with Dr. Oneida Alar with excellent improvement of the circulation to the left lower extremity patient is seen for evaluation for foot salvage intervention.  Patient currently ambulates with a cane.  Patient complains of pain from the metatarsal heads distally at all times.  Assessment & Plan: Visit Diagnoses:  1. PAD (peripheral artery disease) (HCC)   2. Gangrene of left foot ([place])     Plan: Discussed with patient her best option would be to proceed with a transmetatarsal amputation.  Discussed that she may have difficulty healing but with the good circulation of the ankle she does have a good chance of healing a transmetatarsal amputation if she stays off her foot after surgery.  Discussed that this would have a good chance to relieve the pain in her foot.  Patient states she understands she would like to proceed with surgery next week and would like to discuss this with her family.  Follow-Up Instructions: Return in about 2 weeks (around 07/24/2019).   Ortho Exam  Patient is alert, oriented, no adenopathy, well-dressed, normal affect, normal respiratory effort. Examination patient has a palpable dorsalis pedis pulse she has a well-healed partial amputation of the left great toe.  She has dry gangrenous changes of toes 234 and 5 these are tender to palpation with ischemic pain.  There is no ascending cellulitis no drainage.  Imaging: No results found.  No images are attached to the encounter.  Labs: Lab Results  Component Value Date   HGBA1C 6.4 11/05/2018   HGBA1C 6.5 03/29/2017   HGBA1C 6.2 09/29/2016   ESRSEDRATE 24 (H) 11/21/2013   ESRSEDRATE 2 09/09/2009   ESRSEDRATE 3 08/17/2009   CRP <0.5 (L) 11/21/2013   CRP 0.1 (L) 08/17/2009   LABURIC 4.9 08/08/2013   REPTSTATUS 10/25/2018 FINAL 10/20/2018   CULT NO GROWTH 5 DAYS 10/20/2018   LABORGA ESCHERICHIA COLI (A) 10/20/2018     Lab Results  Component Value Date   ALBUMIN 3.2 (L) 05/22/2019   ALBUMIN 3.5 05/09/2019   ALBUMIN 3.4 (L) 05/01/2019   LABURIC 4.9 08/08/2013    Lab Results  Component Value Date   MG 2.3 07/18/2016   MG 2.3 07/18/2016   MG 3.0 (H) 07/17/2016   No results found for: VD25OH  No results found for: PREALBUMIN CBC EXTENDED Latest Ref Rng & Units 05/22/2019 05/16/2019 05/15/2019  WBC 4.0 - 10.5 K/uL 7.1 7.3 7.7  RBC 3.87 - 5.11 MIL/uL 4.27 3.76(L) 3.91  HGB 12.0 - 15.0 g/dL 10.7(L) 9.3(L) 10.0(L)  HCT 36.0 - 46.0 % 35.0(L) 30.2(L) 30.9(L)  PLT 150 - 400 K/uL 519(H) 357 309  NEUTROABS 1.7 - 7.7 K/uL 3.5 - -  LYMPHSABS 0.7 - 4.0 K/uL 2.5 - -     Body mass index is 17.75  kg/m.  Orders:  No orders of the defined types were placed in this encounter.  No orders of the defined types were placed in this encounter.    Procedures: No procedures performed  Clinical Data: No additional findings.  ROS:  All other systems negative, except as noted in the HPI. Review of Systems  Objective: Vital Signs: Ht 5\' 6"  (1.676 m)   Wt 110 lb (49.9 kg)   BMI 17.75 kg/m   Specialty Comments:  No specialty comments available.  PMFS History: Patient Active Problem List   Diagnosis Date Noted  . Protein-calorie malnutrition, severe 05/15/2019  . Critical lower limb ischemia 05/09/2019  . Dyspnea 02/01/2019  . Weight loss, unintentional 02/01/2019  . Epigastric pain 02/01/2019  . 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 Acute pyelonephritis  10/20/2018  . Acute renal failure superimposed on stage 2 chronic kidney disease (HCC) 10/20/2018  . Diabetes (HCC) 04/01/2017  . Hyperthyroidism 02/21/2017  . Low mean corpuscular volume (MCV) 09/30/2016  . Coronary artery disease involving native heart without angina pectoris   . Hx of CABG 07/17/2016  . Paroxysmal atrial fibrillation (HCC) 07/12/2016  . NSTEMI (non-ST elevated myocardial infarction) (HCC) 07/11/2016  . History of arterial bypass of lower extremity 12/30/2015  . Claudication of right lower extremity (HCC) 12/30/2015  . Phantom limb pain-Left great toe 04/29/2015  . Seizure (HCC)   . Atrial flutter with rapid ventricular response vs. SVT 10/27/2014    Class: Acute  . Atherosclerosis of native artery of left lower extremity with rest pain (HCC) 12/18/2013  . PAD (peripheral artery disease) (HCC) 12/02/2013  . SLE (systemic lupus erythematosus) (HCC)   . CAD - S/P Takotsubo MI in 2000, CFX DES Sept 2014 05/25/2013  . Acute MI, inferior wall (HCC) 05/19/2013  . 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 Tachycardia-bradycardia (HCC) 03/09/2010  . PVD- s/p Lt [facility] April 2015 10/29/2009  . Dyslipidemia 08/26/2009  . Essential hypertension 08/26/2009  . CORONARY ATHEROSCLEROSIS NATIVE CORONARY ARTERY 08/26/2009  . 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 Rheumatoid arthritis (HCC) 08/26/2009   Past Medical History:  Diagnosis Date  . Abnormal LFTs    a. in the past when on Lipitor  . Anemia    as a young woman  . Anxiety   . Arnold-Chiari malformation, type I (HCC)    MRI brain 02/2010  . CAD (coronary artery disease)    a. NSTEMI 07/2009: LHC - D2 40%, LAD irreg., EF 50% with apical AK (tako-tsubo CM);  b. Inf STEMI (04/2013): Tx Promus DES to CFX;  c. 08/2013 Lexi CL: No ischemia, dist ant/ap/inf-ap infarct, EF  d. 06/2016 NSTEMI with LM disease: s/p CABG on 07/17/2016 w/ LIMA-LAD, SVG-OM, and SVG-dRCA.  07/19/2016 DEPRESSION   . Dizziness    ? CVA 01/2010 - carotid  dopplers with no ICA stenosis  . GERD (gastroesophageal reflux disease)   . HYPERLIPIDEMIA   . HYPERTENSION   . MI (myocardial infarction) (HCC)   . PAD (peripheral artery disease) (HCC)    s/p L fem pop 11/2013 in setting of 1st toe osteo/gangrene  . Paroxysmal atrial fibrillation (HCC)    a. anticoagulated with Eliquis 10/2014  . 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 RA (rheumatoid arthritis) (HCC)    prior tx by Dr. 11/2014  . Sjogren's syndrome (HCC) 06/02/13   pt denies this (12/01/13)  . SLE (systemic lupus erythematosus) (HCC) dx 05/2013   follows with rheum 06/2013)  . Tachy-brady syndrome (HCC)  a. s/p STJ Leadless pacemaker 11-04-2014 Dr Johney Frame  . 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 Arthritis Mother   . Emphysema Mother   . Arthritis Father   . COPD Father   . Heart disease Father   . Diabetes Paternal Aunt        paternal Aunts  . Heart disease Paternal Aunt   . Thyroid disease Neg Hx     Past Surgical History:  Procedure Laterality Date  . ABDOMINAL AORTAGRAM N/A 11/24/2013   Procedure: ABDOMINAL AORTAGRAM WITH BILATERAL RUNOFF WITH POSSIBLE INTERVENTION;  Surgeon: Chuck Hint, MD;  Location: [facility] CATH LAB;  Service: Cardiovascular;  Laterality: N/A;  . ABDOMINAL AORTOGRAM W/LOWER EXTREMITY N/A 04/25/2019   Procedure: ABDOMINAL AORTOGRAM W/LOWER EXTREMITY;  Surgeon: Sherren Kerns, MD;  Location: [facility] INVASIVE CV LAB;  Service: Cardiovascular;  Laterality: N/A;  . ABDOMINAL AORTOGRAM W/LOWER EXTREMITY Bilateral 05/15/2019   Procedure: ABDOMINAL AORTOGRAM W/LOWER EXTREMITY;  Surgeon: Nada Libman, MD;  Location: [facility] INVASIVE CV LAB;  Service: Cardiovascular;  Laterality: Bilateral;  . AMPUTATION Left 12/02/2013   Procedure: LEFT 1ST TOE AMPUTATION;  Surgeon: Sherren Kerns, MD;  Location: [facility]  OR;  Service: Vascular;  Laterality: Left;  . CARDIAC CATHETERIZATION N/A 07/12/2016   Procedure: Left Heart Cath and Coronary Angiography;  Surgeon: Lennette Bihari, MD;  Location: [facility] INVASIVE CV LAB;  Service: Cardiovascular;  Laterality: N/A;  . CARDIAC CATHETERIZATION N/A 07/14/2016   Procedure: Intravascular Ultrasound/IVUS;  Surgeon: Yvonne Kendall, MD;  Location: [facility] INVASIVE CV LAB;  Service: Cardiovascular;  Laterality: N/A;  . COLONOSCOPY    . CORONARY ANGIOPLASTY  04/2013  . CORONARY ARTERY BYPASS GRAFT N/A 07/17/2016   Procedure: CORONARY ARTERY BYPASS GRAFTING (CABG)x3 using right greater saphenous vein harvested endoscopiclly and left internal mammary artery;  Surgeon: Kerin Perna, MD;  Location: [facility] OR;  Service: Open Heart Surgery;  Laterality: N/A;  . ENDARTERECTOMY FEMORAL Left 05/12/2019   Procedure: LEFT Femoral Endarterectomy;  Surgeon: Sherren Kerns, MD;  Location: [facility] OR;  Service: Vascular;  Laterality: Left;  . FEMORAL-POPLITEAL BYPASS GRAFT Left 12/02/2013   Procedure:  LEFT FEMORAL-BELOW KNEE POPLITEAL ARTERY BYPASS GRAFT;  Surgeon: Sherren Kerns, MD;  Location: [facility] OR;  Service: Vascular;  Laterality: Left;  . LEFT HEART CATHETERIZATION WITH CORONARY ANGIOGRAM N/A 05/19/2013   Procedure: LEFT HEART CATHETERIZATION WITH CORONARY ANGIOGRAM;  Surgeon: Micheline Chapman, MD;  Location: [facility] CATH LAB;  Service: Cardiovascular;  Laterality: N/A;  . LEFT HEART CATHETERIZATION WITH CORONARY ANGIOGRAM N/A 10/28/2014   Procedure: LEFT HEART CATHETERIZATION WITH CORONARY ANGIOGRAM;  Surgeon: Iran Ouch, MD;  Location: [facility] CATH LAB;  Service: Cardiovascular;  Laterality: N/A;  . LOWER EXTREMITY ANGIOGRAM Left 05/12/2019   Procedure: Lower Extremity Angiogram;  Surgeon: Sherren Kerns, MD;  Location: [facility] OR;  Service: Vascular;  Laterality: Left;  . PATCH ANGIOPLASTY Left 05/12/2019   Procedure: Left Femoral Patch Angioplasty USING CADAVERIC SAPHENOUS VEIN;  Surgeon: Sherren Kerns, MD;  Location: [facility] OR;  Service: Vascular;  Laterality: Left;  . PERCUTANEOUS CORONARY STENT INTERVENTION (PCI-S)  05/19/2013   Procedure: PERCUTANEOUS CORONARY STENT INTERVENTION (PCI-S);  Surgeon: Micheline Chapman, MD;  Location: [facility] CATH LAB;  Service: Cardiovascular;;  . PERMANENT PACEMAKER INSERTION N/A 11/04/2014   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  . TEE WITHOUT  CARDIOVERSION N/A 07/17/2016   Procedure: TRANSESOPHAGEAL ECHOCARDIOGRAM (TEE);  Surgeon: Kerin Perna, MD;  Location: [facility] OR;  Service: Open Heart Surgery;  Laterality: N/A;  . THROMBECTOMY FEMORAL ARTERY Left 05/12/2019   Procedure: Left Ilio-Femoral Artery and Femoral-popliteal Graft Thrombectomy;  Surgeon: Sherren Kerns, MD;  Location: [facility] OR;  Service: Vascular;  Laterality: Left;  . TUBAL LIGATION    . VISCERAL ANGIOGRAPHY N/A 04/25/2019   Procedure: MESENTRIC  ANGIOGRAPHY;  Surgeon: Sherren Kerns, MD;  Location: [facility] INVASIVE CV LAB;  Service: Cardiovascular;  Laterality: N/A;   Social History   Occupational History  . Occupation: retired  Tobacco Use  . Smoking status: Former Smoker    Types: Cigarettes    Quit date: 05/19/2016    Years since quitting: 3.1  . Smokeless tobacco: Never Used  . Tobacco comment:    Substance and Sexual Activity  . Alcohol use: No    Alcohol/week: 0.0 standard drinks    Frequency: Never  . Drug use: No  . Sexual activity: Not on file

## 2019-07-14 ENCOUNTER — Other Ambulatory Visit: Payer: Self-pay | Admitting: Physician Assistant

## 2019-07-14 ENCOUNTER — Telehealth: Payer: Self-pay | Admitting: Physician Assistant

## 2019-07-14 ENCOUNTER — Inpatient Hospital Stay (HOSPITAL_COMMUNITY): Admission: RE | Admit: 2019-07-14 | Payer: Medicare Other | Source: Ambulatory Visit

## 2019-07-14 MED ORDER — HYDROCODONE-ACETAMINOPHEN 5-325 MG PO TABS: 1.0000 | ORAL_TABLET | ORAL | 0 refills | Status: DC | PRN

## 2019-07-14 NOTE — Telephone Encounter (Signed)
She is having a lot of ischemic pain. Hydrocodone called in. She will use sparingly

## 2019-07-14 NOTE — Telephone Encounter (Signed)
Patient request a call back from Bevely Palmer to discuss her surgery on Wednesday 07/16/19.

## 2019-07-14 NOTE — Telephone Encounter (Signed)
Mary Anne please advise, thank you. 

## 2019-07-15 ENCOUNTER — Other Ambulatory Visit (HOSPITAL_COMMUNITY)
Admission: RE | Admit: 2019-07-15 | Discharge: 2019-07-15 | Disposition: A | Discharge status: Home / Self Care | Payer: Medicare Other | Source: Ambulatory Visit | Attending: Orthopedic Surgery | Admitting: Orthopedic Surgery

## 2019-07-15 ENCOUNTER — Other Ambulatory Visit: Payer: Self-pay

## 2019-07-15 ENCOUNTER — Encounter (HOSPITAL_COMMUNITY): Payer: Self-pay | Admitting: *Deleted

## 2019-07-15 DIAGNOSIS — Z20822 Contact with and (suspected) exposure to covid-19: Secondary | ICD-10-CM

## 2019-07-15 LAB — SARS CORONAVIRUS 2 (TAT 6-24 HRS): SARS Coronavirus 2: NEGATIVE

## 2019-07-15 NOTE — Progress Notes (Signed)
Anesthesia Chart Review: Mercedes [name]   Case: [id number] Date/Time: 07/16/19 0815   Procedure: LEFT TRANSMETATARSAL AMPUTATION (Left Foot)   Anesthesia type: Choice   Pre-op diagnosis: Gangrene Left Foot   [facility] ROOM 03 / [facility]   Surgeon: Nadara Mustard, MD      DISCUSSION: Patient is a 63 year old female scheduled for the above procedure.  History includes former smoker (quit 05/19/16), CAD (NSTEMI 07/2009, Takotsubo cardiomyopathy; inferior STEMI s/p DES CX 04/2013; NSTEMI 06/2016, s/p CABG: LIMA-LAD, SVG-OM, SVG-dRCA 07/17/16), tachy-brady syndrome (s/p St. Jude Leadless PPM 11/04/14, not responding to magnet 09/2018, patient chose not to replace), PAF, PAD (s/p left FPBG, left first toe amputation 12/02/13; acute/subacute occlusion s/p thrombectomy 05/12/19), HTN, HLD, SLE, RA, Arnold-Chiari Malformation type I (on MRI 02/2010).  - She was last seen at [facility] by Edd Fabian, NP on 05/07/19 for preoperative evaluation for redo left FPBG. She was not having SOB or chest pain, but did report fatigue and limitation in activity (due to left leg pain). Because she could not reach 4 METS a pre-operative stress test was ordered and scheduled for 05/09/19; however, the stress test was never done because instead she developed increased left leg pain and discoloration. She was admitted and started on IV Heparin and underwent thrombectomy of left FPBG for subacute occlusion on 05/12/19. She tolerated that procedure. (Her next visit with Dr. Jens Som is scheduled for 09/18/19.) She is on Eliquis for PAF, and Dr. Jens Som did give permission to temporarily hold for her 04/2019 surgery. Last dose 07/12/19.   - Patient last had EP follow-up in 09/2018. Her Leadless pacemaker was not responding appropriately to magnet. Initially patient wanted to proceed with placement of transvenous pacemaker, but due to lack of symptoms she opted for watchful waiting. Per [name], NP, choice to watch was  "reasonable" but plan to follow-up with Dr. Johney Frame in May 2020. (Per anesthesia notes on 05/12/19, NP Glory Buff was contacted prior to that surgery and "pt did not need any further EP eval prior to surgery and that nothing special needed to be done perioperatively as the pat has elected to not replace the device due to absence of symptoms".)   Patient tolerated urgent-emergent left FPBG thrombectomy on 05/12/19. No special instructions for leadless PPM with that surgery since previously found to not be responding to magnet, and patient chose to not replace. HR 64 05/28/19 and 06/19/19. She denied chest pain and SOB per PAT RN interview. Discussed with anesthesiologist Leslye Peer, MD. If she remains asymptomatic from a CV standpoint then it is anticipated that she can undergo left transmetatarsal amputation for gangrene. Anesthesiologist to further evaluate on the day of surgery--definitive anesthesia plan at that time.     PROVIDERS: Pincus Sanes, MD is PCP  - Fabienne Bruns, MD is vascular surgeon. Last visit 06/19/19. Patient declined toe amputations at that time. Left FPBG was patent, but 50% left CFA will need close surveillance. 3 month follow-up planned. Olga Millers, MD is cardiologist. Last visit 05/07/19 with Edd Fabian, NP. Hillis Range, MD is EP cardiologist. Last visit 10/09/18 with [name], NP.   LABS: For day of surgery. As of 05/22/19, Cr 0.91, H/H 10.7/35.0.   IMAGES: CXR 01/31/19: IMPRESSION: No edema or consolidation. Stable cardiac silhouette. No adenopathy evident.   EKG: 05/07/2019: Sinus rhythm with premature atrial complexes Left axis deviation Abnormal ECG   CV: Abdominal Aortogram with BLE runoff 05/15/19: Impression: #1  Widely patent left  leg bypass graft with small filling defect within the common femoral artery without hemodynamic significance #2  Occluded right superficial femoral artery   Carotid US 07/24/17: Final Interpretation: - Right  Carotid: There is evidence in the right ICA of a 1-39% stenosis. - Left Carotid: There is evidence in the left ICA of a 40-59% stenosis. - Vertebrals:  Both vertebral arteries were patent with antegrade flow. - Subclavians: Normal flow hemodynamics were seen in bilateral subclavian arteries.   TEE (intra-op CABG) 07/17/16: Result status: Final result   Left ventricle: Normal cavity size. Concentric hypertrophy of mild severity. LV systolic function is normal with an EF of 60-65%. There are no obvious wall motion abnormalities. No thrombus present. No mass present.  Aortic valve: No AV vegetation.  Mitral valve: No leaflet thickening and calcification present. Trace regurgitation.  Right ventricle: Normal cavity size, wall thickness and ejection fraction.  Tricuspid valve: Trace regurgitation. The tricuspid valve regurgitation jet is central.  Pulmonic valve: Trace regurgitation.    Last LHC was 07/14/16 prior to CABG.   Past Medical History:  Diagnosis Date  . Abnormal LFTs    a. in the past when on Lipitor  . Anemia    as a young woman  . Anxiety   . Arnold-Chiari malformation, type I ([place])    MRI brain 02/2010  . CAD (coronary artery disease)    a. NSTEMI 07/2009: LHC - D2 40%, LAD irreg., EF 50% with apical AK (tako-tsubo CM);  b. Inf STEMI (04/2013): Tx Promus DES to CFX;  c. 08/2013 Lexi CL: No ischemia, dist ant/ap/inf-ap infarct, EF  d. 06/2016 NSTEMI with LM disease: s/p CABG on 07/17/2016 w/ LIMA-LAD, SVG-OM, and SVG-dRCA.  [name] DEPRESSION   . Dizziness    ? CVA 01/2010 - carotid dopplers with no ICA stenosis  . GERD (gastroesophageal reflux disease)   . HYPERLIPIDEMIA   . HYPERTENSION   . MI (myocardial infarction) ([place])   . PAD (peripheral artery disease) ([place])    s/p L fem pop 11/2013 in setting of 1st toe osteo/gangrene  . Paroxysmal atrial fibrillation (HCC)    a. anticoagulated with Eliquis 10/2014  . Presence of permanent cardiac pacemaker 10/2014   leadless  permanent pacemaker  . RA (rheumatoid arthritis) ([place])    prior tx by Dr. Ouida Sills  . Sjogren's syndrome ([place]) 06/02/13   pt denies this (12/01/13)  . SLE (systemic lupus erythematosus) ([place]) dx 05/2013   follows with rheum Ouida Sills)  . Tachy-brady syndrome (HCC)    a. s/p STJ Leadless pacemaker 11-04-2014 Dr Rayann Heman  . 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 ABDOMINAL AORTAGRAM N/A 11/24/2013   Procedure: ABDOMINAL AORTAGRAM WITH BILATERAL RUNOFF WITH POSSIBLE INTERVENTION;  Surgeon: Angelia Mould, MD;  Location: [facility] CATH LAB;  Service: Cardiovascular;  Laterality: N/A;  . ABDOMINAL AORTOGRAM W/LOWER EXTREMITY N/A 04/25/2019   Procedure: ABDOMINAL AORTOGRAM W/LOWER EXTREMITY;  Surgeon: Elam Dutch, MD;  Location: [place] CV LAB;  Service: Cardiovascular;  Laterality: N/A;  . ABDOMINAL AORTOGRAM W/LOWER EXTREMITY Bilateral 05/15/2019   Procedure: ABDOMINAL AORTOGRAM W/LOWER EXTREMITY;  Surgeon: Serafina Mitchell, MD;  Location: [place] CV LAB;  Service: Cardiovascular;  Laterality: Bilateral;  . AMPUTATION Left 12/02/2013   Procedure: LEFT 1ST TOE AMPUTATION;  Surgeon: Elam Dutch, MD;  Location: [place];  Service: Vascular;  Laterality: Left;  . CARDIAC  CATHETERIZATION N/A 07/12/2016   Procedure: Left Heart Cath and Coronary Angiography;  Surgeon: Lennette Bihari, MD;  Location: [facility] INVASIVE CV LAB;  Service: Cardiovascular;  Laterality: N/A;  . CARDIAC CATHETERIZATION N/A 07/14/2016   Procedure: Intravascular Ultrasound/IVUS;  Surgeon: Yvonne Kendall, MD;  [facility]: [facility] INVASIVE CV LAB;  Service: Cardiovascular;  Laterality: N/A;  . COLONOSCOPY    . CORONARY ANGIOPLASTY  04/2013  . CORONARY ARTERY BYPASS GRAFT N/A 07/17/2016   Procedure: CORONARY ARTERY BYPASS GRAFTING (CABG)x3 using right greater saphenous vein harvested endoscopiclly and left internal  mammary artery;  Surgeon: Kerin Perna, MD;  Location: [facility] OR;  Service: Open Heart Surgery;  Laterality: N/A;  . ENDARTERECTOMY FEMORAL Left 05/12/2019   Procedure: LEFT Femoral Endarterectomy;  Surgeon: Sherren Kerns, MD;  Location: [facility] OR;  Service: Vascular;  Laterality: Left;  . FEMORAL-POPLITEAL BYPASS GRAFT Left 12/02/2013   Procedure:  LEFT FEMORAL-BELOW KNEE POPLITEAL ARTERY BYPASS GRAFT;  Surgeon: Sherren Kerns, MD;  Location: [facility] OR;  Service: Vascular;  Laterality: Left;  . LEFT HEART CATHETERIZATION WITH CORONARY ANGIOGRAM N/A 05/19/2013   Procedure: LEFT HEART CATHETERIZATION WITH CORONARY ANGIOGRAM;  Surgeon: Micheline Chapman, MD;  Location: [facility] CATH LAB;  Service: Cardiovascular;  Laterality: N/A;  . LEFT HEART CATHETERIZATION WITH CORONARY ANGIOGRAM N/A 10/28/2014   Procedure: LEFT HEART CATHETERIZATION WITH CORONARY ANGIOGRAM;  Surgeon: Iran Ouch, MD;  [facility] CATH LAB;  Service: Cardiovascular;  Laterality: N/A;  . LOWER EXTREMITY ANGIOGRAM Left 05/12/2019   Procedure: Lower Extremity Angiogram;  Surgeon: Sherren Kerns, MD;  Location: [facility] OR;  Service: Vascular;  Laterality: Left;  . PATCH ANGIOPLASTY Left 05/12/2019   Procedure: Left Femoral Patch Angioplasty USING CADAVERIC SAPHENOUS VEIN;  Surgeon: Sherren Kerns, MD;  Location: [facility] OR;  Service: Vascular;  Laterality: Left;  . PERCUTANEOUS CORONARY STENT INTERVENTION (PCI-S)  05/19/2013   Procedure: PERCUTANEOUS CORONARY STENT INTERVENTION (PCI-S);  Surgeon: Micheline Chapman, MD;  Location: [facility] CATH LAB;  Service: Cardiovascular;;  . PERMANENT PACEMAKER INSERTION N/A 11/04/2014   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  . TEE WITHOUT CARDIOVERSION N/A 07/17/2016   Procedure: TRANSESOPHAGEAL ECHOCARDIOGRAM (TEE);  Surgeon: Kerin Perna, MD;  Location: [facility] OR;  Service: Open Heart Surgery;  Laterality: N/A;  . THROMBECTOMY FEMORAL ARTERY Left 05/12/2019   Procedure: Left Ilio-Femoral Artery  and Femoral-popliteal Graft Thrombectomy;  Surgeon: Sherren Kerns, MD;  Location: [facility] OR;  Service: Vascular;  Laterality: Left;  . TUBAL LIGATION    . VISCERAL ANGIOGRAPHY N/A 04/25/2019   Procedure: MESENTRIC  ANGIOGRAPHY;  Surgeon: Sherren Kerns, MD;  Location: [facility] INVASIVE CV LAB;  Service: Cardiovascular;  Laterality: N/A;    MEDICATIONS: No current facility-administered medications for this encounter.    [name] acetaminophen (TYLENOL) 500 MG tablet  . aspirin EC 81 MG EC tablet  . ELIQUIS 5 MG TABS tablet  . Ensure (ENSURE)  . lisinopril (ZESTRIL) 10 MG tablet  . metoprolol tartrate (LOPRESSOR) 25 MG tablet  . nitroGLYCERIN (NITROSTAT) 0.4 MG SL tablet  . oxyCODONE-acetaminophen (PERCOCET/ROXICET) 5-325 MG tablet  . HYDROcodone-acetaminophen (NORCO/VICODIN) 5-325 MG tablet  . rosuvastatin (CRESTOR) 10 MG tablet    Shonna Chock, PA-C Surgical Short Stay/Anesthesiology [facility] Phone [phone] [facility] Phone [phone] 07/15/2019 5:16 PM

## 2019-07-15 NOTE — Anesthesia Preprocedure Evaluation (Addendum)
Anesthesia Evaluation  Patient identified by MRN, date of birth, ID band Patient awake    Reviewed: Allergy & Precautions, H&P , NPO status , Patient's Chart, lab work & pertinent test results  Airway Mallampati: I  TM Distance: >3 FB Neck ROM: Full    Dental no notable dental hx. (+) Edentulous Upper, Edentulous Lower, Partial Lower, Missing,    Pulmonary neg pulmonary ROS, shortness of breath, former smoker,    Pulmonary exam normal breath sounds clear to auscultation       Cardiovascular Exercise Tolerance: Good hypertension, + CAD, + Past MI and + Peripheral Vascular Disease  Normal cardiovascular exam+ pacemaker  Rhythm:Regular Rate:Normal  EKG: 05/07/2019: Sinus rhythm with premature atrial complexes Left axis deviation  CV: Abdominal Aortogram with BLE runoff 05/15/19: Impression: #1 Widely patent left leg bypass graft with small filling defect within the common femoral artery without hemodynamic significance #2 Occluded right superficial femoral artery  TEE (intra-op CABG) 07/17/16: Result status: Final result  Left ventricle: Normal cavity size. Concentric hypertrophy of mild severity. LV systolic function is normal with an EF of 60-65%. There are no obvious wall motion abnormalities. No thrombus present. No mass present.  Aortic valve: No AV vegetation.  Mitral valve: No leaflet thickening and calcification present. Trace regurgitation.  Right ventricle: Normal cavity size, wall thickness and ejection fraction.  Tricuspid valve: Trace regurgitation. The tricuspid valve regurgitation jet is central.  Pulmonic valve: Trace regurgitation.     Neuro/Psych Seizures -, Well Controlled,  PSYCHIATRIC DISORDERS Anxiety Depression Carotid US 07/24/17: Final Interpretation: - Right Carotid: There is evidence in the right ICA of a 1-39% stenosis. - Left Carotid: There is evidence in the left ICA of a 40-59%  stenosis. - Vertebrals: Both vertebral arteries were patent with antegrade flow. - Subclavians: Normal flow hemodynamics were seen in bilateral subclavian arteries.   Neuromuscular disease negative neurological ROS  negative psych ROS   GI/Hepatic negative GI ROS, Neg liver ROS, GERD  ,  Endo/Other  negative endocrine ROSdiabetes  Renal/GU Renal diseasenegative Renal ROS  negative genitourinary   Musculoskeletal negative musculoskeletal ROS (+) Arthritis , Rheumatoid disorders,    Abdominal   Peds negative pediatric ROS (+)  Hematology negative hematology ROS (+) Blood dyscrasia, anemia ,   Anesthesia Other Findings   Reproductive/Obstetrics negative OB ROS                           Anesthesia Physical Anesthesia Plan  ASA: IV  Anesthesia Plan: General   Post-op Pain Management:    Induction: Intravenous  PONV Risk Score and Plan: 3 and Ondansetron and Treatment may vary due to age or medical condition  Airway Management Planned: LMA  Additional Equipment:   Intra-op Plan:   Post-operative Plan: Extubation in OR  Informed Consent: I have reviewed the patients History and Physical, chart, labs and discussed the procedure including the risks, benefits and alternatives for the proposed anesthesia with the patient or authorized representative who has indicated his/her understanding and acceptance.       Plan Discussed with: Anesthesiologist, CRNA and Surgeon  Anesthesia Plan Comments: (See PAT note written 07/15/2019 by Myra Gianotti, PA-C. S/p left FPBG thrombectomy 05/12/19. CABG 06/2016. History of leadless St. Jude PPM in 2016, but not responding to magnet as of 09/2018 and she opted not to replace given no symptoms. )     Anesthesia Quick Evaluation

## 2019-07-15 NOTE — Progress Notes (Addendum)
SDW  PCP -  Cardiologist - [place] [facility] - [place]  PPM/ICD - PPM Device Orders - faxed to device clinic Rep Notified - Spoke with [facility]   Chest x-ray - 02/02/19 EKG - 05/07/19 Stress Test - 2015 ECHO -  Cardiac Cath - 2017  Blood Thinner Instructions:11/14 Aspirin Instructions:11/14  ERAS Protcol - clears until 0530 PRE-SURGERY Ensure or G2-   COVID TEST- pending   Anesthesia review: yes  Patient denies shortness of breath, fever, cough and chest pain at PAT appointment   All instructions explained to the patient, with a verbal understanding of the material. Patient agrees to go over the instructions while at home for a better understanding. Patient also instructed to self quarantine after being tested for COVID-19. The opportunity to ask questions was provided.

## 2019-07-16 ENCOUNTER — Other Ambulatory Visit: Payer: Self-pay

## 2019-07-16 ENCOUNTER — Encounter (HOSPITAL_COMMUNITY)
Admission: AD | Disposition: A | Discharge status: Home / Self Care | Payer: Self-pay | Source: Home / Self Care | Attending: Orthopedic Surgery

## 2019-07-16 ENCOUNTER — Other Ambulatory Visit: Payer: Self-pay | Admitting: Physician Assistant

## 2019-07-16 ENCOUNTER — Ambulatory Visit (HOSPITAL_COMMUNITY): Payer: Medicare Other | Admitting: Vascular Surgery

## 2019-07-16 ENCOUNTER — Inpatient Hospital Stay (HOSPITAL_COMMUNITY)
Admission: AD | Admit: 2019-07-16 | Discharge: 2019-07-18 | DRG: 241 | Disposition: A | Discharge status: Home / Self Care | Payer: Medicare Other | Attending: Orthopedic Surgery | Admitting: Orthopedic Surgery

## 2019-07-16 DIAGNOSIS — I10 Essential (primary) hypertension: Secondary | ICD-10-CM | POA: Diagnosis present

## 2019-07-16 DIAGNOSIS — Z951 Presence of aortocoronary bypass graft: Secondary | ICD-10-CM

## 2019-07-16 DIAGNOSIS — Z87891 Personal history of nicotine dependence: Secondary | ICD-10-CM

## 2019-07-16 DIAGNOSIS — Z91040 Latex allergy status: Secondary | ICD-10-CM

## 2019-07-16 DIAGNOSIS — Z89432 Acquired absence of left foot: Secondary | ICD-10-CM | POA: Diagnosis present

## 2019-07-16 DIAGNOSIS — Z79899 Other long term (current) drug therapy: Secondary | ICD-10-CM

## 2019-07-16 DIAGNOSIS — Z8249 Family history of ischemic heart disease and other diseases of the circulatory system: Secondary | ICD-10-CM

## 2019-07-16 DIAGNOSIS — Z7982 Long term (current) use of aspirin: Secondary | ICD-10-CM

## 2019-07-16 DIAGNOSIS — M069 Rheumatoid arthritis, unspecified: Secondary | ICD-10-CM | POA: Diagnosis present

## 2019-07-16 DIAGNOSIS — I252 Old myocardial infarction: Secondary | ICD-10-CM

## 2019-07-16 DIAGNOSIS — Z955 Presence of coronary angioplasty implant and graft: Secondary | ICD-10-CM

## 2019-07-16 DIAGNOSIS — Z888 Allergy status to other drugs, medicaments and biological substances status: Secondary | ICD-10-CM

## 2019-07-16 DIAGNOSIS — I2510 Atherosclerotic heart disease of native coronary artery without angina pectoris: Secondary | ICD-10-CM | POA: Diagnosis present

## 2019-07-16 DIAGNOSIS — Z89411 Acquired absence of right great toe: Secondary | ICD-10-CM

## 2019-07-16 DIAGNOSIS — E785 Hyperlipidemia, unspecified: Secondary | ICD-10-CM | POA: Diagnosis present

## 2019-07-16 DIAGNOSIS — M329 Systemic lupus erythematosus, unspecified: Secondary | ICD-10-CM | POA: Diagnosis present

## 2019-07-16 DIAGNOSIS — I96 Gangrene, not elsewhere classified: Secondary | ICD-10-CM | POA: Diagnosis not present

## 2019-07-16 DIAGNOSIS — Z20828 Contact with and (suspected) exposure to other viral communicable diseases: Secondary | ICD-10-CM | POA: Diagnosis present

## 2019-07-16 DIAGNOSIS — K219 Gastro-esophageal reflux disease without esophagitis: Secondary | ICD-10-CM | POA: Diagnosis present

## 2019-07-16 DIAGNOSIS — Z79891 Long term (current) use of opiate analgesic: Secondary | ICD-10-CM

## 2019-07-16 HISTORY — PX: AMPUTATION: SHX166

## 2019-07-16 LAB — BASIC METABOLIC PANEL
Anion gap: 8 (ref 5–15)
BUN: 11 mg/dL (ref 8–23)
CO2: 24 mmol/L (ref 22–32)
Calcium: 9.7 mg/dL (ref 8.9–10.3)
Chloride: 105 mmol/L (ref 98–111)
Creatinine, Ser: 0.91 mg/dL (ref 0.44–1.00)
GFR calc Af Amer: >60 mL/min (ref >60)
GFR calc non Af Amer: >60 mL/min (ref >60)
Glucose, Bld: 105 mg/dL — ABNORMAL HIGH (ref 70–99)
Potassium: 4 mmol/L (ref 3.5–5.1)
Sodium: 137 mmol/L (ref 135–145)

## 2019-07-16 LAB — CBC
HCT: 44.6 % (ref 36.0–46.0)
Hemoglobin: 13.4 g/dL (ref 12.0–15.0)
MCH: 25 pg — ABNORMAL LOW (ref 26.0–34.0)
MCHC: 30 g/dL (ref 30.0–36.0)
MCV: 83.1 fL (ref 80.0–100.0)
Platelets: 363 10*3/uL (ref 150–400)
RBC: 5.37 MIL/uL — ABNORMAL HIGH (ref 3.87–5.11)
RDW: 16 % — ABNORMAL HIGH (ref 11.5–15.5)
WBC: 4.6 10*3/uL (ref 4.0–10.5)
nRBC: 0 % (ref 0.0–0.2)

## 2019-07-16 LAB — NOVEL CORONAVIRUS, NAA: SARS-CoV-2, NAA: NOT DETECTED

## 2019-07-16 SURGERY — AMPUTATION, FOOT, PARTIAL
Anesthesia: General | Site: Foot | Laterality: Left

## 2019-07-16 MED ORDER — LISINOPRIL 10 MG PO TABS
10.0000 mg | ORAL_TABLET | Freq: Every day | ORAL | Status: DC
  Administered 2019-07-16 – 2019-07-18 (×3): 10 mg via ORAL
  Filled 2019-07-16 (×3): qty 1

## 2019-07-16 MED ORDER — METOPROLOL TARTRATE 25 MG PO TABS
12.5000 mg | ORAL_TABLET | Freq: Two times a day (BID) | ORAL | Status: DC
  Administered 2019-07-16 – 2019-07-18 (×4): 12.5 mg via ORAL
  Filled 2019-07-16 (×4): qty 1

## 2019-07-16 MED ORDER — OXYCODONE HCL 5 MG PO TABS
5.0000 mg | ORAL_TABLET | Freq: Once | ORAL | Status: AC | PRN
  Administered 2019-07-16: 5 mg via ORAL

## 2019-07-16 MED ORDER — FENTANYL CITRATE (PF) 100 MCG/2ML IJ SOLN
INTRAMUSCULAR | Status: AC
  Filled 2019-07-16: qty 2

## 2019-07-16 MED ORDER — OXYCODONE HCL 5 MG PO TABS
5.0000 mg | ORAL_TABLET | ORAL | Status: DC | PRN
  Administered 2019-07-16 – 2019-07-17 (×5): 10 mg via ORAL
  Administered 2019-07-18: 5 mg via ORAL
  Administered 2019-07-18: 10 mg via ORAL
  Filled 2019-07-16 (×7): qty 2

## 2019-07-16 MED ORDER — HYDROMORPHONE HCL 1 MG/ML IJ SOLN
INTRAMUSCULAR | Status: AC
  Filled 2019-07-16: qty 1

## 2019-07-16 MED ORDER — ACETAMINOPHEN 160 MG/5ML PO SOLN: 325.0000 mg | ORAL | Status: DC | PRN

## 2019-07-16 MED ORDER — HYDRALAZINE HCL 20 MG/ML IJ SOLN
10.0000 mg | Freq: Once | INTRAMUSCULAR | Status: AC
  Administered 2019-07-16: 10 mg via INTRAVENOUS

## 2019-07-16 MED ORDER — DEXAMETHASONE SODIUM PHOSPHATE 10 MG/ML IJ SOLN
INTRAMUSCULAR | Status: AC
  Filled 2019-07-16: qty 1

## 2019-07-16 MED ORDER — ONDANSETRON HCL 4 MG/2ML IJ SOLN
INTRAMUSCULAR | Status: AC
  Filled 2019-07-16: qty 2

## 2019-07-16 MED ORDER — HYDRALAZINE HCL 20 MG/ML IJ SOLN
INTRAMUSCULAR | Status: AC
  Filled 2019-07-16: qty 1

## 2019-07-16 MED ORDER — METOCLOPRAMIDE HCL 5 MG/ML IJ SOLN: 5.0000 mg | Freq: Three times a day (TID) | INTRAMUSCULAR | Status: DC | PRN

## 2019-07-16 MED ORDER — KETOROLAC TROMETHAMINE 15 MG/ML IJ SOLN
15.0000 mg | INTRAMUSCULAR | Status: AC
  Administered 2019-07-16 – 2019-07-17 (×4): 15 mg via INTRAVENOUS
  Filled 2019-07-16 (×4): qty 1

## 2019-07-16 MED ORDER — PROPOFOL 10 MG/ML IV BOLUS
INTRAVENOUS | Status: AC
  Filled 2019-07-16: qty 80

## 2019-07-16 MED ORDER — PROPOFOL 10 MG/ML IV BOLUS
INTRAVENOUS | Status: DC | PRN
  Administered 2019-07-16: 80 mg via INTRAVENOUS

## 2019-07-16 MED ORDER — OXYCODONE HCL 5 MG/5ML PO SOLN: 5.0000 mg | Freq: Once | ORAL | Status: AC | PRN

## 2019-07-16 MED ORDER — HYDROMORPHONE HCL 1 MG/ML IJ SOLN
1.0000 mg | INTRAMUSCULAR | Status: DC | PRN
  Administered 2019-07-18: 1 mg via INTRAVENOUS
  Filled 2019-07-16 (×2): qty 1

## 2019-07-16 MED ORDER — CEFAZOLIN SODIUM-DEXTROSE 2-4 GM/100ML-% IV SOLN
2.0000 g | INTRAVENOUS | Status: AC
  Administered 2019-07-16: 2 g via INTRAVENOUS
  Filled 2019-07-16: qty 100

## 2019-07-16 MED ORDER — ACETAMINOPHEN 325 MG PO TABS: 325.0000 mg | ORAL_TABLET | ORAL | Status: DC | PRN

## 2019-07-16 MED ORDER — FENTANYL CITRATE (PF) 250 MCG/5ML IJ SOLN
INTRAMUSCULAR | Status: AC
  Filled 2019-07-16: qty 5

## 2019-07-16 MED ORDER — DOCUSATE SODIUM 100 MG PO CAPS
100.0000 mg | ORAL_CAPSULE | Freq: Two times a day (BID) | ORAL | Status: DC
  Administered 2019-07-16 – 2019-07-18 (×4): 100 mg via ORAL
  Filled 2019-07-16 (×4): qty 1

## 2019-07-16 MED ORDER — ENSURE ENLIVE PO LIQD
237.0000 mL | Freq: Two times a day (BID) | ORAL | Status: DC
  Administered 2019-07-17 – 2019-07-18 (×3): 237 mL via ORAL

## 2019-07-16 MED ORDER — FENTANYL CITRATE (PF) 100 MCG/2ML IJ SOLN
25.0000 ug | INTRAMUSCULAR | Status: DC | PRN
  Administered 2019-07-16 (×2): 50 ug via INTRAVENOUS
  Administered 2019-07-16 (×2): 25 ug via INTRAVENOUS

## 2019-07-16 MED ORDER — ASPIRIN EC 81 MG PO TBEC
81.0000 mg | DELAYED_RELEASE_TABLET | Freq: Every day | ORAL | Status: DC
  Administered 2019-07-17 – 2019-07-18 (×2): 81 mg via ORAL
  Filled 2019-07-16 (×4): qty 1

## 2019-07-16 MED ORDER — DEXAMETHASONE SODIUM PHOSPHATE 10 MG/ML IJ SOLN
INTRAMUSCULAR | Status: DC | PRN
  Administered 2019-07-16: 5 mg via INTRAVENOUS

## 2019-07-16 MED ORDER — ACETAMINOPHEN 325 MG PO TABS
325.0000 mg | ORAL_TABLET | Freq: Four times a day (QID) | ORAL | Status: DC | PRN
  Filled 2019-07-16: qty 2

## 2019-07-16 MED ORDER — FENTANYL CITRATE (PF) 250 MCG/5ML IJ SOLN
INTRAMUSCULAR | Status: DC | PRN
  Administered 2019-07-16 (×2): 25 ug via INTRAVENOUS

## 2019-07-16 MED ORDER — CHLORHEXIDINE GLUCONATE 4 % EX LIQD: 60.0000 mL | Freq: Once | CUTANEOUS | Status: DC

## 2019-07-16 MED ORDER — LIDOCAINE HCL (CARDIAC) PF 100 MG/5ML IV SOSY
PREFILLED_SYRINGE | INTRAVENOUS | Status: DC | PRN
  Administered 2019-07-16: 60 mg via INTRATRACHEAL

## 2019-07-16 MED ORDER — PHENYLEPHRINE HCL (PRESSORS) 10 MG/ML IV SOLN
INTRAVENOUS | Status: DC | PRN
  Administered 2019-07-16: 80 ug via INTRAVENOUS
  Administered 2019-07-16 (×2): 120 ug via INTRAVENOUS

## 2019-07-16 MED ORDER — PHENYLEPHRINE 40 MCG/ML (10ML) SYRINGE FOR IV PUSH (FOR BLOOD PRESSURE SUPPORT)
PREFILLED_SYRINGE | INTRAVENOUS | Status: AC
  Filled 2019-07-16: qty 10

## 2019-07-16 MED ORDER — 0.9 % SODIUM CHLORIDE (POUR BTL) OPTIME
TOPICAL | Status: DC | PRN
  Administered 2019-07-16: 1000 mL

## 2019-07-16 MED ORDER — ONDANSETRON HCL 4 MG PO TABS: 4.0000 mg | ORAL_TABLET | Freq: Four times a day (QID) | ORAL | Status: DC | PRN

## 2019-07-16 MED ORDER — LACTATED RINGERS IV SOLN
INTRAVENOUS | Status: DC | PRN
  Administered 2019-07-16: 08:00:00 via INTRAVENOUS

## 2019-07-16 MED ORDER — MIDAZOLAM HCL 2 MG/2ML IJ SOLN
INTRAMUSCULAR | Status: DC | PRN
  Administered 2019-07-16: 2 mg via INTRAVENOUS

## 2019-07-16 MED ORDER — METOCLOPRAMIDE HCL 5 MG PO TABS: 5.0000 mg | ORAL_TABLET | Freq: Three times a day (TID) | ORAL | Status: DC | PRN

## 2019-07-16 MED ORDER — ONDANSETRON HCL 4 MG/2ML IJ SOLN
INTRAMUSCULAR | Status: DC | PRN
  Administered 2019-07-16: 4 mg via INTRAVENOUS

## 2019-07-16 MED ORDER — HYDROMORPHONE HCL 1 MG/ML IJ SOLN
0.2500 mg | INTRAMUSCULAR | Status: DC | PRN
  Administered 2019-07-16 (×3): 0.5 mg via INTRAVENOUS

## 2019-07-16 MED ORDER — APIXABAN 5 MG PO TABS
5.0000 mg | ORAL_TABLET | Freq: Two times a day (BID) | ORAL | Status: DC
  Administered 2019-07-16 – 2019-07-18 (×4): 5 mg via ORAL
  Filled 2019-07-16 (×4): qty 1

## 2019-07-16 MED ORDER — OXYCODONE HCL 5 MG PO TABS
ORAL_TABLET | ORAL | Status: AC
  Filled 2019-07-16: qty 1

## 2019-07-16 MED ORDER — CEFAZOLIN SODIUM-DEXTROSE 1-4 GM/50ML-% IV SOLN
1.0000 g | Freq: Four times a day (QID) | INTRAVENOUS | Status: AC
  Administered 2019-07-16 – 2019-07-17 (×3): 1 g via INTRAVENOUS
  Filled 2019-07-16 (×4): qty 50

## 2019-07-16 MED ORDER — MIDAZOLAM HCL 2 MG/2ML IJ SOLN
INTRAMUSCULAR | Status: AC
  Filled 2019-07-16: qty 2

## 2019-07-16 MED ORDER — SODIUM CHLORIDE 0.9 % IV SOLN
INTRAVENOUS | Status: DC
  Administered 2019-07-16 – 2019-07-17 (×2): via INTRAVENOUS

## 2019-07-16 MED ORDER — ONDANSETRON HCL 4 MG/2ML IJ SOLN: 4.0000 mg | Freq: Once | INTRAMUSCULAR | Status: DC | PRN

## 2019-07-16 MED ORDER — ONDANSETRON HCL 4 MG/2ML IJ SOLN: 4.0000 mg | Freq: Four times a day (QID) | INTRAMUSCULAR | Status: DC | PRN

## 2019-07-16 MED ORDER — MEPERIDINE HCL 25 MG/ML IJ SOLN: 6.2500 mg | INTRAMUSCULAR | Status: DC | PRN

## 2019-07-16 MED ORDER — NITROGLYCERIN 0.4 MG SL SUBL: 0.4000 mg | SUBLINGUAL_TABLET | SUBLINGUAL | Status: DC | PRN

## 2019-07-16 SURGICAL SUPPLY — 36 items
BENZOIN TINCTURE PRP APPL 2/3 (GAUZE/BANDAGES/DRESSINGS) ×3 IMPLANT
BLADE SAW SGTL HD 18.5X60.5X1. (BLADE) ×3 IMPLANT
BLADE SURG 21 STRL SS (BLADE) ×3 IMPLANT
BNDG COHESIVE 4X5 TAN STRL (GAUZE/BANDAGES/DRESSINGS) IMPLANT
BNDG GAUZE ELAST 4 BULKY (GAUZE/BANDAGES/DRESSINGS) IMPLANT
CANISTER WOUNDNEG PRESSURE 500 (CANNISTER) ×3 IMPLANT
COVER SURGICAL LIGHT HANDLE (MISCELLANEOUS) ×3 IMPLANT
COVER WAND RF STERILE (DRAPES) ×3 IMPLANT
DRAPE INCISE IOBAN 66X45 STRL (DRAPES) ×3 IMPLANT
DRAPE U-SHAPE 47X51 STRL (DRAPES) ×3 IMPLANT
DRESSING PREVENA PLUS CUSTOM (GAUZE/BANDAGES/DRESSINGS) ×1 IMPLANT
DRSG ADAPTIC 3X8 NADH LF (GAUZE/BANDAGES/DRESSINGS) IMPLANT
DRSG PAD ABDOMINAL 8X10 ST (GAUZE/BANDAGES/DRESSINGS) IMPLANT
DRSG PREVENA PLUS CUSTOM (GAUZE/BANDAGES/DRESSINGS) ×3
DURAPREP 26ML APPLICATOR (WOUND CARE) ×3 IMPLANT
ELECT REM PT RETURN 9FT ADLT (ELECTROSURGICAL) ×3
ELECTRODE REM PT RTRN 9FT ADLT (ELECTROSURGICAL) ×1 IMPLANT
GAUZE SPONGE 4X4 12PLY STRL (GAUZE/BANDAGES/DRESSINGS) IMPLANT
GLOVE BIOGEL PI IND STRL 9 (GLOVE) ×1 IMPLANT
GLOVE BIOGEL PI INDICATOR 9 (GLOVE) ×2
GLOVE SURG ORTHO 9.0 STRL STRW (GLOVE) ×3 IMPLANT
GOWN STRL REUS W/ TWL XL LVL3 (GOWN DISPOSABLE) ×3 IMPLANT
GOWN STRL REUS W/TWL XL LVL3 (GOWN DISPOSABLE) ×6
KIT BASIN OR (CUSTOM PROCEDURE TRAY) ×3 IMPLANT
KIT TURNOVER KIT B (KITS) ×3 IMPLANT
NS IRRIG 1000ML POUR BTL (IV SOLUTION) ×3 IMPLANT
PACK ORTHO EXTREMITY (CUSTOM PROCEDURE TRAY) ×3 IMPLANT
PAD ARMBOARD 7.5X6 YLW CONV (MISCELLANEOUS) ×6 IMPLANT
SPONGE LAP 18X18 RF (DISPOSABLE) ×3 IMPLANT
SUT ETHILON 2 0 PSLX (SUTURE) ×6 IMPLANT
TOWEL GREEN STERILE (TOWEL DISPOSABLE) ×3 IMPLANT
TOWEL GREEN STERILE FF (TOWEL DISPOSABLE) ×3 IMPLANT
TUBE CONNECTING 12'X1/4 (SUCTIONS) ×1
TUBE CONNECTING 12X1/4 (SUCTIONS) ×2 IMPLANT
WATER STERILE IRR 1000ML POUR (IV SOLUTION) ×3 IMPLANT
YANKAUER SUCT BULB TIP NO VENT (SUCTIONS) ×3 IMPLANT

## 2019-07-16 NOTE — Anesthesia Postprocedure Evaluation (Signed)
Anesthesia Post Note  Patient: Mercedes [name]  Procedure(s) Performed: LEFT TRANSMETATARSAL AMPUTATION (Left Foot)     Anesthesia Post Evaluation  Last Vitals:  Vitals:   07/16/19 1334 07/16/19 1352  BP: (!) 155/82 (!) 145/76  Pulse: 81 93  Resp: 13 15  Temp: (!) 36.1 C 36.9 C  SpO2: 99% 93%    Last Pain:  Vitals:   07/16/19 1632  TempSrc:   PainSc: 10-Worst pain ever   Pain Goal: Patients Stated Pain Goal: 4 (07/16/19 1632)                 Nazaria Riesen

## 2019-07-16 NOTE — Progress Notes (Signed)
Pt continues to complain of pain at surgical site, despite PRN oxycodone given. Dr. Sharol Given paged, and new pain medicine orders placed, will give pain medication and continue to monitor.  Marijean Heath, Therapist, sports, BSN

## 2019-07-16 NOTE — Progress Notes (Signed)
Orthopedic Tech Progress Note Patient Details:  Mercedes [name] Sep 01, 1955 [id number]  Ortho Devices Type of Ortho Device: Postop shoe/boot Ortho Device/Splint Location: left foot Ortho Device/Splint Interventions: Ordered   Post Interventions Patient Tolerated: Other (comment)(nurse asked to put shoe in room and not on patient because of pain level.)   Staci Righter 07/16/2019, 4:35 PM

## 2019-07-16 NOTE — Op Note (Addendum)
07/16/2019  9:05 AM  PATIENT:  Mercedes [name]    PRE-OPERATIVE DIAGNOSIS:  Gangrene Left Foot  POST-OPERATIVE DIAGNOSIS:  Same  PROCEDURE:  LEFT TRANSMETATARSAL AMPUTATION Application of pravena vac  SURGEON:  Newt Minion, MD  PHYSICIAN ASSISTANT:None ANESTHESIA:   General  PREOPERATIVE INDICATIONS:  [name] is a  63 y.o. female with a diagnosis of Gangrene Left Foot who failed conservative measures and elected for surgical management.    The risks benefits and alternatives were discussed with the patient preoperatively including but not limited to the risks of infection, bleeding, nerve injury, cardiopulmonary complications, the need for revision surgery, among others, and the patient was willing to proceed.  OPERATIVE IMPLANTS: Praveena customizable wound VAC  @ENCIMAGES @  OPERATIVE FINDINGS: Calcified vessels with good petechial bleeding  OPERATIVE PROCEDURE: Patient was brought the operating room and underwent a general anesthetic.  After adequate levels anesthesia were obtained patient's left lower extremity was prepped using DuraPrep draped into a sterile field a timeout was called.  A fishmouth incision was made at the level of the metatarsal heads.  Dissection was carried down to the metatarsal necks and a transmetatarsal amputation was performed with an oscillating saw with the plantar aspect beveled.  Electrocautery was used for hemostasis the wound was irrigated with normal saline there was good petechial bleeding the muscle had good color contractility and consistency the vessels were calcified.  The incision was closed using 2-0 nylon a Praveena incisional wound VAC was applied this had a good suction fit patient was extubated taken the PACU in stable condition   DISCHARGE PLANNING:  Antibiotic duration: 23-hour antibiotics  Weightbearing: Nonweightbearing on the left  Pain medication: Opioid pathway  Dressing care/ Wound VAC: Continue wound VAC at  discharge  Ambulatory devices: Walker  Discharge to: home after observation, patient has cardiac history  Follow-up: In the office 1 week post operative.

## 2019-07-16 NOTE — Transfer of Care (Signed)
Immediate Anesthesia Transfer of Care Note  Patient: Mercedes [name]  Procedure(s) Performed: LEFT TRANSMETATARSAL AMPUTATION (Left Foot)  Patient Location: PACU  Anesthesia Type:General  Level of Consciousness: drowsy and patient cooperative  Airway & Oxygen Therapy: Patient Spontanous Breathing and Patient connected to face mask oxygen  Post-op Assessment: Report given to RN and Post -op Vital signs reviewed and stable  Post vital signs: Reviewed and stable  Last Vitals:  Vitals Value Taken Time  BP 138/119 07/16/19 0906  Temp    Pulse    Resp 12 07/16/19 0906  SpO2    Vitals shown include unvalidated device data.  Last Pain:  Vitals:   07/16/19 0710  TempSrc: Oral  PainSc: 9       Patients Stated Pain Goal: 3 (88/11/03 1594)  Complications: No apparent anesthesia complications

## 2019-07-16 NOTE — Anesthesia Procedure Notes (Signed)
Procedure Name: LMA Insertion Date/Time: 07/16/2019 8:46 AM Performed by: Kathryne Hitch, CRNA Pre-anesthesia Checklist: Patient identified, Emergency Drugs available, Suction available and Patient being monitored Patient Re-evaluated:Patient Re-evaluated prior to induction Oxygen Delivery Method: Circle system utilized Preoxygenation: Pre-oxygenation with 100% oxygen Induction Type: IV induction Ventilation: Mask ventilation without difficulty LMA: LMA inserted LMA Size: 4.0 Number of attempts: 1 Placement Confirmation: positive ETCO2 Tube secured with: Tape

## 2019-07-16 NOTE — Plan of Care (Signed)
  Problem: Education: Goal: Knowledge of General Education information will improve Description: Including pain rating scale, medication(s)/side effects and non-pharmacologic comfort measures Outcome: Progressing   Problem: Clinical Measurements: Goal: Respiratory complications will improve Outcome: Progressing Note: On room air   Problem: Nutrition: Goal: Adequate nutrition will be maintained Outcome: Progressing   Problem: Coping: Goal: Level of anxiety will decrease Outcome: Progressing   Problem: Elimination: Goal: Will not experience complications related to urinary retention Outcome: Progressing   Problem: Safety: Goal: Ability to remain free from injury will improve Outcome: Progressing   

## 2019-07-16 NOTE — H&P (Signed)
Mercedes [name] is an 63 y.o. female.   Chief Complaint: Left Foot Gangrene HPI:  Patient is a 63 year old woman who was seen in consultation for initial evaluation for left foot with gangrene of toes 234 and 5 of the left foot status post partial great toe amputation.  Patient has undergone revascularization with Dr. Darrick Penna with excellent improvement of the circulation to the left lower extremity patient is seen for evaluation for foot salvage intervention.  Patient currently ambulates with a cane.  Patient complains of pain from the metatarsal heads distally at all times. Past Medical History:  Diagnosis Date  . Abnormal LFTs    a. in the past when on Lipitor  . Anemia    as a young woman  . Anxiety   . Arnold-Chiari malformation, type I (HCC)    MRI brain 02/2010  . CAD (coronary artery disease)    a. NSTEMI 07/2009: LHC - D2 40%, LAD irreg., EF 50% with apical AK (tako-tsubo CM);  b. Inf STEMI (04/2013): Tx Promus DES to CFX;  c. 08/2013 Lexi CL: No ischemia, dist ant/ap/inf-ap infarct, EF  d. 06/2016 NSTEMI with LM disease: s/p CABG on 07/17/2016 w/ LIMA-LAD, SVG-OM, and SVG-dRCA.  [name] DEPRESSION   . Dizziness    ? CVA 01/2010 - carotid dopplers with no ICA stenosis  . GERD (gastroesophageal reflux disease)   . HYPERLIPIDEMIA   . HYPERTENSION   . MI (myocardial infarction) (HCC)   . PAD (peripheral artery disease) (HCC)    s/p L fem pop 11/2013 in setting of 1st toe osteo/gangrene  . Paroxysmal atrial fibrillation (HCC)    a. anticoagulated with Eliquis 10/2014  . 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 RA (rheumatoid arthritis) (HCC)    prior tx by Dr. Dareen Piano  . Sjogren's syndrome (HCC) 06/02/13   pt denies this (12/01/13)  . SLE (systemic lupus erythematosus) (HCC) dx 05/2013   follows with rheum Dareen Piano)  . Tachy-brady syndrome (HCC)    a. s/p STJ Leadless pacemaker 11-04-2014 Dr Johney Frame  . 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 ABDOMINAL AORTAGRAM N/A 11/24/2013   Procedure: ABDOMINAL AORTAGRAM WITH BILATERAL RUNOFF WITH POSSIBLE INTERVENTION;  Surgeon: Chuck Hint, MD;  Location: [facility] CATH LAB;  Service: Cardiovascular;  Laterality: N/A;  . ABDOMINAL AORTOGRAM W/LOWER EXTREMITY N/A 04/25/2019   Procedure: ABDOMINAL AORTOGRAM W/LOWER EXTREMITY;  Surgeon: Sherren Kerns, MD;  Location: [facility] INVASIVE CV LAB;  Service: Cardiovascular;  Laterality: N/A;  . ABDOMINAL AORTOGRAM W/LOWER EXTREMITY Bilateral 05/15/2019   Procedure: ABDOMINAL AORTOGRAM W/LOWER EXTREMITY;  Surgeon: Nada Libman, MD;  Location: [facility] INVASIVE CV LAB;  Service: Cardiovascular;  Laterality: Bilateral;  . AMPUTATION Left 12/02/2013   Procedure: LEFT 1ST TOE AMPUTATION;  Surgeon: Sherren Kerns, MD;  Location[facility] OR;  Service: Vascular;  Laterality: Left;  . CARDIAC CATHETERIZATION N/A 07/12/2016   Procedure: Left Heart Cath and Coronary Angiography;  Surgeon: Lennette Bihari, MD;  Location: [facility] INVASIVE CV LAB;  Service: Cardiovascular;  Laterality: N/A;  . CARDIAC CATHETERIZATION N/A 07/14/2016   Procedure: Intravascular Ultrasound/IVUS;  Surgeon: Yvonne Kendall, MD;  Location: [facility] INVASIVE CV LAB;  Service: Cardiovascular;  Laterality: N/A;  . COLONOSCOPY    . CORONARY ANGIOPLASTY  04/2013  . CORONARY ARTERY BYPASS GRAFT N/A 07/17/2016   Procedure: CORONARY ARTERY BYPASS GRAFTING (  CABG)x3 using right greater saphenous vein harvested endoscopiclly and left internal mammary artery;  Surgeon: Kerin Perna, MD;  Location[facility];  Service: Open Heart Surgery;  Laterality: N/A;  . ENDARTERECTOMY FEMORAL Left 05/12/2019   Procedure: LEFT Femoral Endarterectomy;  Surgeon: Sherren Kerns, MD;  Location: [facility] OR;  Service: Vascular;  Laterality: Left;  . FEMORAL-POPLITEAL BYPASS GRAFT Left 12/02/2013   Procedure:  LEFT FEMORAL-BELOW  KNEE POPLITEAL ARTERY BYPASS GRAFT;  Surgeon: Sherren Kerns, MD;  Location: [facility] OR;  Service: Vascular;  Laterality: Left;  . LEFT HEART CATHETERIZATION WITH CORONARY ANGIOGRAM N/A 05/19/2013   Procedure: LEFT HEART CATHETERIZATION WITH CORONARY ANGIOGRAM;  Surgeon: Micheline Chapman, MD;  Location: [facility] CATH LAB;  Service: Cardiovascular;  Laterality: N/A;  . LEFT HEART CATHETERIZATION WITH CORONARY ANGIOGRAM N/A 10/28/2014   Procedure: LEFT HEART CATHETERIZATION WITH CORONARY ANGIOGRAM;  Surgeon: Iran Ouch, MD;  Location: [facility] CATH LAB;  Service: Cardiovascular;  Laterality: N/A;  . LOWER EXTREMITY ANGIOGRAM Left 05/12/2019   Procedure: Lower Extremity Angiogram;  Surgeon: Sherren Kerns, MD;  Location: [facility] OR;  Service: Vascular;  Laterality: Left;  . PATCH ANGIOPLASTY Left 05/12/2019   Procedure: Left Femoral Patch Angioplasty USING CADAVERIC SAPHENOUS VEIN;  Surgeon: Sherren Kerns, MD;  Location: [facility] OR;  Service: Vascular;  Laterality: Left;  . PERCUTANEOUS CORONARY STENT INTERVENTION (PCI-S)  05/19/2013   Procedure: PERCUTANEOUS CORONARY STENT INTERVENTION (PCI-S);  Surgeon: Micheline Chapman, MD;  Location: [facility] CATH LAB;  Service: Cardiovascular;;  . PERMANENT PACEMAKER INSERTION N/A 11/04/2014   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  . TEE WITHOUT CARDIOVERSION N/A 07/17/2016   Procedure: TRANSESOPHAGEAL ECHOCARDIOGRAM (TEE);  Surgeon: Kerin Perna, MD;  Location: [facility] OR;  Service: Open Heart Surgery;  Laterality: N/A;  . THROMBECTOMY FEMORAL ARTERY Left 05/12/2019   Procedure: Left Ilio-Femoral Artery and Femoral-popliteal Graft Thrombectomy;  Surgeon: Sherren Kerns, MD;  Location: [facility] OR;  Service: Vascular;  Laterality: Left;  . TUBAL LIGATION    . VISCERAL ANGIOGRAPHY N/A 04/25/2019   Procedure: MESENTRIC  ANGIOGRAPHY;  Surgeon: Sherren Kerns, MD;  Location: [facility] INVASIVE CV LAB;  Service: Cardiovascular;  Laterality: N/A;    Family History  Problem  Relation Age of Onset  . Arthritis Mother   . Emphysema Mother   . Arthritis Father   . COPD Father   . Heart disease Father   . Diabetes Paternal Aunt        paternal Aunts  . Heart disease Paternal Aunt   . Thyroid disease Neg Hx    Social History:  reports that she quit smoking about 3 years ago. Her smoking use included cigarettes. She has never used smokeless tobacco. She reports current drug use. Drug: Marijuana. She reports that she does not drink alcohol.  Allergies:  Allergies  Allergen Reactions  . Brilinta [Ticagrelor] Anaphylaxis and Other (See Comments)    Also, chest tightness  . Latex Rash    Medications Prior to Admission  Medication Sig Dispense Refill  . acetaminophen (TYLENOL) 500 MG tablet Take 1,000 mg by mouth every 6 (six) hours as needed for moderate pain or headache.    [name] aspirin EC 81 MG EC tablet Take 1 tablet (81 mg total) by mouth daily.    [name] ELIQUIS 5 MG TABS tablet TAKE 1 TABLET BY MOUTH TWICE DAILY (Patient taking differently: Take 5 mg by mouth 2 (two) times daily. ) 180 tablet 3  . Ensure (ENSURE) Take  237 mLs by mouth daily.    [name] HYDROcodone-acetaminophen (NORCO/VICODIN) 5-325 MG tablet Take 1 tablet by mouth every 4 (four) hours as needed for moderate pain. 20 tablet 0  . lisinopril (ZESTRIL) 10 MG tablet Take 1 tablet (10 mg total) by mouth daily. 90 tablet 2  . metoprolol tartrate (LOPRESSOR) 25 MG tablet Take 0.5 tablets (12.5 mg total) by mouth 2 (two) times daily. 180 tablet 3  . nitroGLYCERIN (NITROSTAT) 0.4 MG SL tablet Place 1 tablet (0.4 mg total) under the tongue every 5 (five) minutes as needed for chest pain. 25 tablet 3  . oxyCODONE-acetaminophen (PERCOCET/ROXICET) 5-325 MG tablet Take 1 tablet by mouth every 6 (six) hours as needed for severe pain. 20 tablet 0  . rosuvastatin (CRESTOR) 10 MG tablet Take 1 tablet (10 mg total) by mouth daily at 6 PM. (Patient not taking: Reported on 07/10/2019) 30 tablet 2    Results for orders  placed or performed during the hospital encounter of 07/15/19 (from the past 48 hour(s))  SARS CORONAVIRUS 2 (TAT 6-24 HRS) Nasopharyngeal Nasopharyngeal Swab     Status: None   Collection Time: 07/15/19  1:06 PM   Specimen: Nasopharyngeal Swab  Result Value Ref Range   SARS Coronavirus 2 NEGATIVE NEGATIVE    Comment: (NOTE) SARS-CoV-2 target nucleic acids are NOT DETECTED. The SARS-CoV-2 RNA is generally detectable in upper and lower respiratory specimens during the acute phase of infection. Negative results do not preclude SARS-CoV-2 infection, do not rule out co-infections with other pathogens, and should not be used as the sole basis for treatment or other patient management decisions. Negative results must be combined with clinical observations, patient history, and epidemiological information. The expected result is Negative. Fact Sheet for Patients: SugarRoll.be Fact Sheet for Healthcare Providers: https://www.woods-mathews.com/ This test is not yet approved or cleared by the [place] FDA and  has been authorized for detection and/or diagnosis of SARS-CoV-2 by FDA under an Emergency Use Authorization (EUA). This EUA will remain  in effect (meaning this test can be used) for the duration of the COVID-19 declaration under Section 56 4(b)(1) of the Act, 21 U.S.C. section 360bbb-3(b)(1), unless the authorization is terminated or revoked sooner. Performed at [facility], [place] [address] [place], [place] [address]    No results found.  Review of Systems  All other systems reviewed and are negative.   There were no vitals taken for this visit. Physical Exam    Patient is alert, oriented, no adenopathy, well-dressed, normal affect, normal respiratory effort. Examination patient has a palpable dorsalis pedis pulse she has a well-healed partial amputation of the left great toe.  She has dry gangrenous changes of toes 234 and  5 these are tender to palpation with ischemic pain.  There is no ascending cellulitis no drainage. Assessment/Plan 1. PAD (peripheral artery disease) (HCC)   2. Gangrene of left foot ([place])     Plan: Discussed with patient her best option would be to proceed with a transmetatarsal amputation.  Discussed that she may have difficulty healing but with the good circulation of the ankle she does have a good chance of healing a transmetatarsal amputation if she stays off her foot after surgery.  Discussed that this would have a good chance to relieve the pain in her foot.  Patient states she understands she would like to proceed with surgery next week and would like to discuss this with her family.   Bevely Palmer Rifka Ramey, PA 07/16/2019, 7:12 AM

## 2019-07-17 ENCOUNTER — Encounter (HOSPITAL_COMMUNITY): Payer: Self-pay | Admitting: Orthopedic Surgery

## 2019-07-17 DIAGNOSIS — I2510 Atherosclerotic heart disease of native coronary artery without angina pectoris: Secondary | ICD-10-CM | POA: Diagnosis present

## 2019-07-17 DIAGNOSIS — Z79891 Long term (current) use of opiate analgesic: Secondary | ICD-10-CM | POA: Diagnosis not present

## 2019-07-17 DIAGNOSIS — Z888 Allergy status to other drugs, medicaments and biological substances status: Secondary | ICD-10-CM | POA: Diagnosis not present

## 2019-07-17 DIAGNOSIS — I252 Old myocardial infarction: Secondary | ICD-10-CM | POA: Diagnosis not present

## 2019-07-17 DIAGNOSIS — K219 Gastro-esophageal reflux disease without esophagitis: Secondary | ICD-10-CM | POA: Diagnosis present

## 2019-07-17 DIAGNOSIS — Z8249 Family history of ischemic heart disease and other diseases of the circulatory system: Secondary | ICD-10-CM | POA: Diagnosis not present

## 2019-07-17 DIAGNOSIS — Z79899 Other long term (current) drug therapy: Secondary | ICD-10-CM | POA: Diagnosis not present

## 2019-07-17 DIAGNOSIS — Z91040 Latex allergy status: Secondary | ICD-10-CM | POA: Diagnosis not present

## 2019-07-17 DIAGNOSIS — M069 Rheumatoid arthritis, unspecified: Secondary | ICD-10-CM | POA: Diagnosis present

## 2019-07-17 DIAGNOSIS — Z20828 Contact with and (suspected) exposure to other viral communicable diseases: Secondary | ICD-10-CM | POA: Diagnosis present

## 2019-07-17 DIAGNOSIS — I96 Gangrene, not elsewhere classified: Secondary | ICD-10-CM | POA: Diagnosis present

## 2019-07-17 DIAGNOSIS — Z7982 Long term (current) use of aspirin: Secondary | ICD-10-CM | POA: Diagnosis not present

## 2019-07-17 DIAGNOSIS — Z951 Presence of aortocoronary bypass graft: Secondary | ICD-10-CM | POA: Diagnosis not present

## 2019-07-17 DIAGNOSIS — Z87891 Personal history of nicotine dependence: Secondary | ICD-10-CM | POA: Diagnosis not present

## 2019-07-17 DIAGNOSIS — E785 Hyperlipidemia, unspecified: Secondary | ICD-10-CM | POA: Diagnosis present

## 2019-07-17 DIAGNOSIS — I10 Essential (primary) hypertension: Secondary | ICD-10-CM | POA: Diagnosis present

## 2019-07-17 DIAGNOSIS — Z89411 Acquired absence of right great toe: Secondary | ICD-10-CM | POA: Diagnosis not present

## 2019-07-17 DIAGNOSIS — M329 Systemic lupus erythematosus, unspecified: Secondary | ICD-10-CM | POA: Diagnosis present

## 2019-07-17 DIAGNOSIS — Z89432 Acquired absence of left foot: Secondary | ICD-10-CM

## 2019-07-17 DIAGNOSIS — Z955 Presence of coronary angioplasty implant and graft: Secondary | ICD-10-CM | POA: Diagnosis not present

## 2019-07-17 HISTORY — DX: Acquired absence of left foot: Z89.432

## 2019-07-17 MED ORDER — ADULT MULTIVITAMIN W/MINERALS CH
1.0000 | ORAL_TABLET | Freq: Every day | ORAL | Status: DC
  Administered 2019-07-17 – 2019-07-18 (×2): 1 via ORAL
  Filled 2019-07-17 (×2): qty 1

## 2019-07-17 NOTE — Evaluation (Signed)
Physical Therapy Evaluation Patient Details Name: Mercedes [name] MRN: [id number] DOB: Jun 05, 1956 Today's Date: 07/17/2019   History of Present Illness  Mercedes [name] is a  63 y.o. female with a diagnosis of Gangrene Left Foot who failed conservative measures and elected for surgical management L transmetatarsal amputation    Clinical Impression  Pt admitted with above diagnosis. On eval pt required supervision bed mobility, min guard assist transfers and min guard assist ambulation 15' x 2 with RW. Pt able to maintain NWB LLE. Mercedes [name] has all needed DME for home, including wheelchair.  Pt currently with functional limitations due to the deficits listed below (see PT Problem List). Pt will benefit from skilled PT to increase their independence and safety with mobility to allow discharge to the venue listed below.  PT to follow acutely. No follow up services indicated.     Follow Up Recommendations No PT follow up;Supervision - Intermittent    Equipment Recommendations  None recommended by PT    Recommendations for Other Services       Precautions / Restrictions Precautions Precautions: Fall;Other (comment) Precaution Comments: wound vac L foot Required Braces or Orthoses: Other Brace Other Brace: L post op shoe Restrictions LLE Weight Bearing: Non weight bearing Other Position/Activity Restrictions: NWB L LE      Mobility  Bed Mobility Overal bed mobility: Needs Assistance Bed Mobility: Supine to Sit     Supine to sit: Supervision;HOB elevated     General bed mobility comments: +rail, no physical assist  Transfers Overall transfer level: Needs assistance Equipment used: Rolling walker (2 wheeled) Transfers: Sit to/from [facility] Sit to Stand: Min guard Stand pivot transfers: Min guard       General transfer comment: min guard A for safety  Ambulation/Gait Ambulation/Gait assistance: Min guard Gait Distance (Feet): 15 Feet(x 2) Assistive  device: Rolling walker (2 wheeled) Gait Pattern/deviations: Step-to pattern Gait velocity: decreased   General Gait Details: Pt able to maintain NWB LLE. Min guard for safety. No LOB noted.  Stairs            Wheelchair Mobility    Modified Rankin (Stroke Patients Only)       Balance Overall balance assessment: Needs assistance Sitting-balance support: Feet supported;No upper extremity supported Sitting balance-Leahy Scale: Good     Standing balance support: Bilateral upper extremity supported;During functional activity Standing balance-Leahy Scale: Poor Standing balance comment: reliant on RW due to NWB LLE                             Pertinent Vitals/Pain Pain Assessment: 0-10 Pain Score: 4  Pain Location: L foot with mobility Pain Descriptors / Indicators: Aching;Sore Pain Intervention(s): Monitored during session;Limited activity within patient's tolerance;Repositioned;Premedicated before session    [place] expects to be discharged to:: Private residence Living Arrangements: Alone Available Help at Discharge: Family;Available PRN/intermittently Type of Home: House Home Access: Stairs to enter Entrance Stairs-Rails: None Entrance Stairs-Number of Steps: 2 Home Layout: One level Home Equipment: Walker - 2 wheels;Cane - single point;Tub bench;Bedside commode;Hand held shower head;Adaptive equipment;Wheelchair - manual      Prior Function Level of Independence: Independent with assistive device(s)   Gait / Transfers Assistance Needed: cane for amb  ADL's / Homemaking Assistance Needed: was independent with ADLs/selfcare, light home mgt, cooking        Hand Dominance   Dominant Hand: Right    Extremity/Trunk Assessment   Upper Extremity  Assessment Upper Extremity Assessment: Overall WFL for tasks assessed    Lower Extremity Assessment Lower Extremity Assessment: LLE deficits/detail LLE Deficits / Details: s/p  transmet amp    Cervical / Trunk Assessment Cervical / Trunk Assessment: Normal  Communication   Communication: No difficulties  Cognition Arousal/Alertness: Awake/alert Behavior During Therapy: WFL for tasks assessed/performed Overall Cognitive Status: Within Functional Limits for tasks assessed                                        General Comments General comments (skin integrity, edema, etc.): Pt reports Mercedes [name] plans to have her son carry her up the 2 steps into her house.    Exercises     Assessment/Plan    PT Assessment Patient needs continued PT services  PT Problem List Decreased mobility;Decreased knowledge of precautions;Decreased activity tolerance;Decreased balance;Pain       PT Treatment Interventions Therapeutic activities;DME instruction;Gait training;Patient/family education;Stair training;Balance training;Functional mobility training    PT Goals (Current goals can be found in the Care Plan section)  Acute Rehab PT Goals Patient Stated Goal: go home PT Goal Formulation: With patient Time For Goal Achievement: 07/24/19 Potential to Achieve Goals: Good    Frequency Min 3X/week   Barriers to discharge        Co-evaluation   Reason for Co-Treatment: For patient/therapist safety;To address functional/ADL transfers   OT goals addressed during session: ADL's and self-care;Proper use of Adaptive equipment and DME       AM-PAC PT "6 Clicks" Mobility  Outcome Measure Help needed turning from your back to your side while in a flat bed without using bedrails?: None Help needed moving from lying on your back to sitting on the side of a flat bed without using bedrails?: None Help needed moving to and from a bed to a chair (including a wheelchair)?: A Little Help needed standing up from a chair using your arms (e.g., wheelchair or bedside chair)?: A Little Help needed to walk in hospital room?: A Little Help needed climbing 3-5 steps with a  railing? : A Lot 6 Click Score: 19    End of Session Equipment Utilized During Treatment: Gait belt Activity Tolerance: Patient tolerated treatment well Patient left: in chair;with call bell/phone within reach Nurse Communication: Mobility status PT Visit Diagnosis: Unsteadiness on feet (R26.81);Pain Pain - Right/Left: Left Pain - part of body: Ankle and joints of foot    Time: 8101-7510 PT Time Calculation (min) (ACUTE ONLY): 21 min   Charges:   PT Evaluation $PT Eval Moderate Complexity: 1 Mod          Aida Raider, PT  Office # [phone] Pager [phone]   Ilda Foil 07/17/2019, 11:59 AM

## 2019-07-17 NOTE — Discharge Instructions (Signed)
High-Calorie, High-Protein Nutrition Therapy A high-calorie, high-protein diet has been recommended for you either because you cant eat enough calories throughout the day, have lost weight, or need to add protein to your diet. Following the recommendations on this handout can help you: Gain weight and give your body energy Get more protein from foods that help your body heal and grow strong Recover from surgery or illness  Tips Tips to Eat More Calories and Protein Aim for at Least 6 Meals and Snacks Each Day Extra meals and snacks can help you get enough calories and protein. You may want to try high-calorie supplement drinks (made at home or bought at a store) periodically between meals to get more calories each day. If you buy the drink at the store, read the label to look for products with 200-400 calories per serving. If you make the drink at home, you can increase calories by adding protein ingredients such as nonfat milk, low-fat yogurt, nonfat milk powder, or protein powder. Enjoy snacks such as milkshakes, smoothies, pudding, ice cream, or custard. Eat More Fat Fat provides a lot of calories in just a few bites. A tablespoon of oil, butter, or margarine has about 100 calories. Add butter, margarine, or oil to bread, potatoes, vegetables, and soups. Use mayonnaise, salad dressing, and peanut butter freely. Choose High-Protein Foods Enjoy milk, eggs, cheese, meat, fish, poultry, and beans. Consider trying protein powders and meal replacement shakes and bars. Choose higher-fat meats. They have more calories than lean meats. Examples include chicken thighs, marbled meats, bacon, sausage, poultry with skin Choose whole milk instead of low-fat or skim milk. Eat high-fat cheeses instead of low-fat or nonfat cheeses. Shopping Tips Avoid diet, low-calorie, or low-fat food items. Look for dairy products (milk, cheese, yogurt, cottage cheese) that are labeled whole fat or have at least  4% fat. Purchase nonfat dry milk powder or protein powder to use to make shakes or other blended recipes. Copyright Academy of [employer]etics. This handout may be duplicated for client education. Page 2/6 Cooking Tips Make a high-protein milk recipe like the one below. The recipe can be prepared in advance and stored in the refrigerator until you are ready to drink it. Use this high-protein milk in recipes that call for milk or drink it as a beverage. 1 cup whole milk  cup nonfat dry milk powder Add cheese sauce, butter, and sour cream to vegetable and potato dishes. Get extra calories by adding condensed milk, cream, butter, nut butters, and sweetener to hot cereals, mashed potato, pudding, and soups. Examples: Prepare oatmeal with condensed milk, butter/nut butter, and brown sugar Prepare mashed potatoes with cream, butter, and cheese Prepare soup with cream and extra butter, or puree the soup with cream to make a bisque Add cream to pudding mix or use pudding dry mix in cakes/baked goods Serve items with extra sauces. These contain additional calories: Gravy on meats and potatoes Extra mayonnaise, BBQ sauce or ketchup Dipping sauces, hummus, and regular (not low-fat/low-calorie) salad dressing  Foods Recommended Foods Recommended Calories Protein in grams (g) Protein Foods 1 cup cooked dried beans 240 14  cup chicken salad 200 14 1 egg cooked with 1 tablespoon butter 175 6 3 ounces tuna canned in oil 170 25  cup egg substitute 25 5 1  ounce pecans (20 halves) 200 3 1 ounce macadamia nuts (10-12 nuts) 200 2 1 ounce nuts (6-8 nuts) 190 4 1 ounce walnuts (14 halves) 185 4 1 ounce shelled sunflower seeds  175 6 1 ounce almonds (about 24) 165 4 1 ounce peanuts 165 7 1 tablespoon peanut butter 95 4  cup canned evaporated milk (can be used instead of water when cooking) 170 9 6 ounces sweetened yogurt 165 6  cup ice cream 130 2-3  cup (1  ounce) shredded cheese 115 7  cup creamed cottage cheese 110 13  cup half-and-half 80 2  cup whole milk (can be used instead of water when cooking) 75 4 1 tablespoon cream cheese 50 1 2 tablespoons sour cream 50 0 Fats Copyright Academy of Nutrition and Dietetics. This handout may be duplicated for client education. Page 4/6 1 tablespoon butter, margarine, oil, or mayonnaise 100 0 2 tablespoons gravy 4 1 Sweets 1 tablespoon honey 60 0 1 tablespoon sugar, jam, jelly, or chocolate syrup 50 0 Meal Replacements 1 meal replacement bar 200 15 1 scoop (1 ounce) protein powder 100 15 1 tablespoon protein powder 40 5  High-Calorie, High-Protein Sample 1-Day Menu Breakfast 1 large egg, scrambled 1 medium biscuit 1 tablespoon jam 2 tablespoon butter 1 cup apple juice Morning Snack 1/4 cup peanuts 1/4 cup raisins Lunch 4 oz tuna salad (with mayonnaise, oil, relish) 1 hard-boiled egg 2 canned peach halves 2 tablespoons cream cheese 4 walnut halves 1 cup grape juice Afternoon Snack 1/2 cup orange juice in smoothie 1/4 cup frozen strawberries in smoothie 1 banana in smoothie 1 oz protein powder in smoothie Evening Meal 3 oz ground beef patty 2 tablespoons gravy 3 large stalks broccoli 2 tablespoons cheese sauce 2 slices bread 1 tablespoon butter Evening Snack 1/2 cup hummus 1 whole wheat pita  High-Calorie, High-Protein Vegan Sample 1-Day Menu Breakfast 1 cup cooked oatmeal made with: 1 cup soymilk fortified with calcium, vitamin B12, and vitamin D 2 tablespoons ground flaxseeds  cup blueberries  cup almonds Morning Snack 1 cup fruit smoothie made with:  cup soymilk fortified with calcium, vitamin B12, and vitamin D  cup frozen banana 2 tablespoons peanut butter Lunch 2 slices whole wheat bread  cup baked tofu  cup lettuce 2 slices tomato 4 slices avocado 2 teaspoons vegan mayonnaise  cup baby carrots with: 1 tablespoon almond  butter 1 medium apple 1 cup grape juice Afternoon Snack 1 whole wheat pita bread  cup hummus  cup orange juice Evening Meal Burrito made with: 2, 6-inch corn tortilla 1 cup refried vegetarian beans  cup chopped tomatoes  cup lettuce 2 tablespoons cup salsa  cup brown rice  tablespoon olive oil for rice  cup cooked zucchini 1 cup soymilk fortified with calcium, vitamin B12, and vitamin D Evening Snack  cup raisins  cup peanuts  High-Calorie, High-Protein Vegetarian (Lacto-Ovo) Sample 1-Day Menu Breakfast 1 cup cooked oatmeal made with: 1 cup whole milk 2 tablespoons ground flaxseeds  cup blueberries 1 egg Morning Snack 1 cup fruit smoothie made with:  cup whole milk  cup frozen banana 2 tablespoons almond butter Lunch 2 slices whole wheat bread 2 ounces cheese  cup lettuce 2 slices tomato 4 slices avocado  cup baby carrots 1 medium apple 1 cup grape juice Afternoon Snack 1 whole wheat pita bread  cup hummus  cup orange juice Evening Meal Burrito made with: 2, 6-inch corn tortilla  cup refried vegetarian beans  cup chopped tomatoes  cup lettuce 2 tablespoons salsa  cup brown rice  tablespoon olive oil for rice  cup cooked zucchini 1 cup whole milk Evening Snack  cup raisins  cup peanuts  Notes

## 2019-07-17 NOTE — Evaluation (Signed)
Occupational Therapy Evaluation Patient Details Name: Mercedes [name] MRN: [id number] DOB: 04-Sep-1955 Today's Date: 07/17/2019    History of Present Illness Mercedes [name] is a  63 y.o. female with a diagnosis of Gangrene Left Foot who failed conservative measures and elected for surgical management L transmetatarsal amputation   Clinical Impression   Pt with decline in function and safety with ADLs and ADL mobility with impaired balance and endurance. Pt reports that PTA, she lived at home alone and was independent with ADLs/selfcare, light home mgt, meal prep and used a RW for mobility. Pt currently requires min guard A for LB ADLs/selfcare and mobility using RW. Believe pt will progress quickly and will be safe to go home with intermittent assist from family when medically ready. Pt would benefit from acute OT services to address impairments to maximize level of function and safety    Follow Up Recommendations  No OT follow up;Supervision - Intermittent    Equipment Recommendations  None recommended by OT    Recommendations for Other Services       Precautions / Restrictions Precautions Precautions: Fall Required Braces or Orthoses: Other Brace Other Brace: L post op shoe Restrictions LLE Weight Bearing: Non weight bearing Other Position/Activity Restrictions: NWB L LE      Mobility Bed Mobility Overal bed mobility: Needs Assistance Bed Mobility: Supine to Sit     Supine to sit: Supervision     General bed mobility comments: no physical assist  Transfers Overall transfer level: Needs assistance Equipment used: Rolling walker (2 wheeled) Transfers: Sit to/from [facility] Sit to Stand: Min guard Stand pivot transfers: Min guard       General transfer comment: min guard A for safety    Balance Overall balance assessment: Needs assistance Sitting-balance support: Feet supported Sitting balance-Leahy Scale: Good     Standing balance  support: Single extremity supported;Bilateral upper extremity supported;During functional activity Standing balance-Leahy Scale: Poor                             ADL either performed or assessed with clinical judgement   ADL Overall ADL's : Needs assistance/impaired Eating/Feeding: Independent;Sitting   Grooming: Wash/dry hands;Wash/dry face;Standing;Min guard;Oral care   Upper Body Bathing: Set up;Independent;Sitting   Lower Body Bathing: Min guard;Sit to/from stand   Upper Body Dressing : Set up;Independent;Sitting   Lower Body Dressing: Minimal assistance;Min guard;Sit to/from stand   Toilet Transfer: Min guard;Ambulation;RW;Comfort height toilet;Regular Toilet;BSC;Grab bars;Cueing for safety   Toileting- Clothing Manipulation and Hygiene: Min guard;Sit to/from stand       Functional mobility during ADLs: Min guard;Rolling walker;Cueing for safety General ADL Comments: min guard A for safety     Vision Patient Visual Report: No change from baseline       Perception     Praxis      Pertinent Vitals/Pain Pain Assessment: 0-10 Pain Score: 4  Pain Location: L foot with mobility Pain Descriptors / Indicators: Aching;Sore Pain Intervention(s): Monitored during session;Premedicated before session;Repositioned     Hand Dominance Right   Extremity/Trunk Assessment Upper Extremity Assessment Upper Extremity Assessment: Overall WFL for tasks assessed   Lower Extremity Assessment Lower Extremity Assessment: Defer to PT evaluation   Cervical / Trunk Assessment Cervical / Trunk Assessment: Normal   Communication Communication Communication: No difficulties   Cognition Arousal/Alertness: Awake/alert Behavior During Therapy: WFL for tasks assessed/performed Overall Cognitive Status: Within Functional Limits for tasks assessed  General Comments       Exercises     Shoulder Instructions      Home  Living Family/patient expects to be discharged to:: Private residence Living Arrangements: Alone Available Help at Discharge: Family;Available PRN/intermittently Type of Home: House Home Access: Stairs to enter [employer] of Steps: 2 Entrance Stairs-Rails: None Home Layout: One level     Bathroom Shower/Tub: Chief Strategy Officer: Standard     Home Equipment: Environmental consultant - 2 wheels;Cane - single point;Tub bench;Bedside commode;Hand held shower head;Adaptive equipment Adaptive Equipment: Reacher        Prior Functioning/Environment Level of Independence: Independent with assistive device(s)  Gait / Transfers Assistance Needed: uses cane and RW, but mostly cane ADL's / Homemaking Assistance Needed: was independent with ADLs/selfcare, light home mgt, cooking            OT Problem List: Impaired balance (sitting and/or standing);Pain;Decreased activity tolerance;Decreased knowledge of use of DME or AE      OT Treatment/Interventions: Self-care/ADL training;DME and/or AE instruction;Therapeutic activities;Balance training;Patient/family education    OT Goals(Current goals can be found in the care plan section) Acute Rehab OT Goals Patient Stated Goal: go home OT Goal Formulation: With patient Time For Goal Achievement: 07/31/19 ADL Goals Pt Will Perform Grooming: with supervision;with set-up;with modified independence;standing Pt Will Perform Lower Body Bathing: with supervision;with set-up;with modified independence;sitting/lateral leans;sit to/from stand Pt Will Perform Lower Body Dressing: with supervision;with set-up;with modified independence;sitting/lateral leans;sit to/from stand Pt Will Transfer to Toilet: with supervision;with modified independence;ambulating Pt Will Perform Toileting - Clothing Manipulation and hygiene: with supervision;with modified independence;sit to/from stand  OT Frequency: Min 2X/week   Barriers to D/C:    no barriers        Co-evaluation PT/OT/SLP Co-Evaluation/Treatment: Yes Reason for Co-Treatment: For patient/therapist safety;To address functional/ADL transfers   OT goals addressed during session: ADL's and self-care;Proper use of Adaptive equipment and DME      AM-PAC OT "6 Clicks" Daily Activity     Outcome Measure Help from another person eating meals?: None Help from another person taking care of personal grooming?: A Little Help from another person toileting, which includes using toliet, bedpan, or urinal?: A Little Help from another person bathing (including washing, rinsing, drying)?: A Little Help from another person to put on and taking off regular upper body clothing?: None Help from another person to put on and taking off regular lower body clothing?: A Little 6 Click Score: 20   End of Session Equipment Utilized During Treatment: Gait belt;Rolling walker;Other (comment)(3 in 1)  Activity Tolerance: Patient tolerated treatment well Patient left: in chair;with call bell/phone within reach  OT Visit Diagnosis: Unsteadiness on feet (R26.81);Other abnormalities of gait and mobility (R26.89);Pain Pain - Right/Left: Left Pain - part of body: Ankle and joints of foot                Time: 4580-9983 OT Time Calculation (min): 18 min Charges:  OT General Charges $OT Visit: 1 Visit OT Evaluation $OT Eval Moderate Complexity: 1 Mod    Galen Manila 07/17/2019, 11:06 AM

## 2019-07-17 NOTE — Progress Notes (Signed)
Patient ID: Mercedes [name], female   DOB: 05-10-1956, 63 y.o.   MRN: [id number] Patient is postoperative day 1 left transmetatarsal amputation.  Patient states she still has sharp shooting pain she does not feel safe discharging to home.  There is no drainage in the wound VAC canister.  Will work with physical therapy today with anticipated discharge to home tomorrow Friday.

## 2019-07-17 NOTE — Progress Notes (Addendum)
Initial Nutrition Assessment  DOCUMENTATION CODES:   Non-severe (moderate) malnutrition in context of acute illness/injury, Underweight  INTERVENTION:   -Ensure Enlive po BID, each supplement provides 350 kcal and 20 grams of protein. -Magic cup TID with meals, each supplement provides 290 kcal and 9 grams of protein. -MVI with minerals.  NUTRITION DIAGNOSIS:   Moderate Malnutrition related to acute illness(gangrene is left foot) as evidenced by moderate fat depletion, moderate muscle depletion, percent weight loss, energy intake < 75% for > 7 days.  GOAL:   Patient will meet greater than or equal to 90% of their needs  MONITOR:   PO intake, Supplement acceptance, Labs, Weight trends  REASON FOR ASSESSMENT:   Malnutrition Screening Tool    ASSESSMENT:   63 year old pt admitted with gangrene in left foot with PMH of PAD, CAD, HTN, HLD, femoral popliteal 2015, pacemake 2016, CABG x3 2017, femoral endarterectomy and patch angioplasty 9/20. Pt found to have gangrene is 2, 3, 4 and 5 metatarsals and had transmetatarsal amputation 07/16/19.  Pt was sitting up in recliner leaning towards her foot. Stated she had recently finished PT and was in need of pain medication. Pt stated she has a very poor appetite and has been skipping meals within the last 2-3 months. Pt stated she will eat a few bites of bagel with cream cheese and jelly and have coffee for breakfast. She said she might eat a bite of food for lunch or dinner but does not like to waste food so tends to skip meals altogether. Pt believes pain from her foot has taken appetite away. Discussed the importance of eating to get enough protein for wound healing and to regain strength. Attached high calorie/high protein list to discharge instructions.  Pt likes vanilla ensure and orange magic cups.  Pt stated usual body weight is 128#. Previous charted wt from 8/20 is 50.3kg which is a 9.1% wt loss and is considered severe. Pt is  eating <50% of meals.  Medications reviewed: lopressor 12.5mg   Labs reviewed: glucose 134  NUTRITION - FOCUSED PHYSICAL EXAM:    Most Recent Value  Orbital Region  Moderate depletion  Upper Arm Region  Mild depletion  Thoracic and Lumbar Region  Moderate depletion  Buccal Region  Mild depletion  Temple Region  Moderate depletion  Clavicle Bone Region  Moderate depletion  Clavicle and Acromion Bone Region  Moderate depletion  Scapular Bone Region  Mild depletion  Dorsal Hand  Mild depletion  Patellar Region  Mild depletion  Anterior Thigh Region  Mild depletion  Posterior Calf Region  Mild depletion  Edema (RD Assessment)  None  Hair  Reviewed  Eyes  Reviewed  Mouth  Reviewed  Skin  Reviewed  Nails  Reviewed       Diet Order:   Diet Order            Diet Carb Modified Fluid consistency: Thin; Room service appropriate? Yes  Diet effective now              EDUCATION NEEDS:   Education needs have been addressed  Skin:  Skin Assessment: Reviewed RN Assessment  Last BM:  11/17  Height:   Ht Readings from Last 1 Encounters:  07/16/19 5\' 6"  (1.676 m)    Weight:   Wt Readings from Last 1 Encounters:  07/16/19 45.8 kg    Ideal Body Weight:  59.1 kg  BMI:  Body mass index is 16.3 kg/m.  Estimated Nutritional Needs:   Kcal:  1500-1700  Protein:  75-85  Fluid:  >1.5L    Daine Gip Dietetic Intern Pager # [phone]

## 2019-07-18 ENCOUNTER — Other Ambulatory Visit: Payer: Self-pay

## 2019-07-18 DIAGNOSIS — I10 Essential (primary) hypertension: Secondary | ICD-10-CM

## 2019-07-18 MED ORDER — ADULT MULTIVITAMIN W/MINERALS CH: 1.0000 | ORAL_TABLET | Freq: Every day | ORAL | Status: DC

## 2019-07-18 MED ORDER — METOPROLOL TARTRATE 25 MG PO TABS: 12.5000 mg | ORAL_TABLET | Freq: Two times a day (BID) | ORAL | 3 refills | Status: DC

## 2019-07-18 MED ORDER — OXYCODONE HCL 5 MG PO TABS: 5.0000 mg | ORAL_TABLET | ORAL | 0 refills | Status: DC | PRN

## 2019-07-18 NOTE — Plan of Care (Signed)

## 2019-07-18 NOTE — Progress Notes (Signed)
Up sitting in chair. Some pain which she attributes to getting to chair. Believes her las pain med was last night. She help at home and agrees to discharge today  0 cc in [facility]. Dressing in place. Compartments soft. Discharge.

## 2019-07-18 NOTE — Progress Notes (Signed)
Occupational Therapy Treatment Patient Details Name: Mercedes [name] MRN: [id number] DOB: Aug 13, 1956 Today's Date: 07/18/2019    History of present illness Mercedes [name] is a  63 y.o. female with a diagnosis of Gangrene Left Foot who failed conservative measures and elected for surgical management L transmetatarsal amputation   OT comments  Overall, pt min guard - MIN A for functional mobility and LB ADLs. Pt demonstrates good safety awareness with RW and is able to maintain NWB status during functional mobility and ADLs. Pt required MIN A for LB dressing via sit>stand needing MIN visual/ verbal cues for sequencing of task with wound vac( pt slightly groggy throughout session,likely d/t pain meds). Pt able to pull up pants to waist line via sit>stand with close min guard with no AD. Pt likely to DC home today with no OT f/u. Will continue to follow acutely per POC.    Follow Up Recommendations  No OT follow up;Supervision - Intermittent    Equipment Recommendations  None recommended by OT    Recommendations for Other Services      Precautions / Restrictions Precautions Precautions: Fall Precaution Comments: wound vac L foot Required Braces or Orthoses: Other Brace Other Brace: L post op shoe Restrictions Weight Bearing Restrictions: Yes LLE Weight Bearing: Non weight bearing Other Position/Activity Restrictions: NWB L LE       Mobility Bed Mobility               General bed mobility comments: received in recliner  Transfers Overall transfer level: Needs assistance Equipment used: Rolling walker (2 wheeled) Transfers: Sit to/from Stand Sit to Stand: Min guard         General transfer comment: min guard A for safety with RW and to manage vac and lines    Balance Overall balance assessment: Needs assistance Sitting-balance support: No upper extremity supported Sitting balance-Leahy Scale: Good Sitting balance - Comments: able to reach down to feet for LB  dressing   Standing balance support: Bilateral upper extremity supported Standing balance-Leahy Scale: Poor Standing balance comment: reliant on RW due to NWB LLE, demonstrates safe mobility with RW                           ADL either performed or assessed with clinical judgement   ADL Overall ADL's : Needs assistance/impaired                     Lower Body Dressing: Minimal assistance;Sit to/from stand;Cueing for sequencing;Min guard Lower Body Dressing Details (indicate cue type and reason): cues to sequence dressing with wound vac. pt a little groggy during session therefore increased time spent to ensure understanding of strategy. Pt with good balance able to sit >stand from recliner with no AD and close minguard for balance Toilet Transfer: Min guard;Ambulation;RW Toilet Transfer Details (indicate cue type and reason): simulated via functional mobility with RW, pt demos good safety awareness and is able to manage WB restricitions at RW level, close min guard for safety d/t grogginess from pain medicine Toileting- Clothing Manipulation and Hygiene: Supervision/safety;Sit to/from stand Toileting - Clothing Manipulation Details (indicate cue type and reason): pt able to demo posterior pericare from recliner via sit>stand with no AD and supervision for safety   Tub/Shower Transfer Details (indicate cue type and reason): pt reports tub bench at home and is able to verabalize transfer technique Functional mobility during ADLs: Min guard;Rolling walker General ADL Comments: min guard A  for safety, MIN A for LB ADL, supervision for simulated toileting tasks. pt overall with good safety awareness and able to manage WB status functionally     Vision Patient Visual Report: No change from baseline     Perception     Praxis      Cognition Arousal/Alertness: Awake/alert;Lethargic Behavior During Therapy: WFL for tasks assessed/performed Overall Cognitive Status: Within  Functional Limits for tasks assessed                                 General Comments: slighlty groogy and slow to process d/t pain meds        Exercises     Shoulder Instructions       General Comments noted no draining from wound vac    Pertinent Vitals/ Pain       Pain Assessment: 0-10 Pain Score: 3  Pain Location: L foot when hanging down Pain Descriptors / Indicators: Heaviness Pain Intervention(s): Monitored during session;Repositioned  Home Living                                          Prior Functioning/Environment              Frequency  Min 2X/week        Progress Toward Goals  OT Goals(current goals can now be found in the care plan section)  Progress towards OT goals: Progressing toward goals  Acute Rehab OT Goals Patient Stated Goal: go home OT Goal Formulation: With patient Time For Goal Achievement: 07/31/19  Plan Discharge plan remains appropriate    Co-evaluation                 AM-PAC OT "6 Clicks" Daily Activity     Outcome Measure   Help from another person eating meals?: None Help from another person taking care of personal grooming?: A Little Help from another person toileting, which includes using toliet, bedpan, or urinal?: A Little Help from another person bathing (including washing, rinsing, drying)?: A Little Help from another person to put on and taking off regular upper body clothing?: None Help from another person to put on and taking off regular lower body clothing?: A Little 6 Click Score: 20    End of Session Equipment Utilized During Treatment: Rolling walker  OT Visit Diagnosis: Unsteadiness on feet (R26.81);Other abnormalities of gait and mobility (R26.89);Pain Pain - Right/Left: Left Pain - part of body: Ankle and joints of foot   Activity Tolerance Patient tolerated treatment well   Patient Left in chair;with call bell/phone within reach   Nurse Communication  Mobility status;Other (comment)(assisted with donning pants; IV beeping)        Time: 3818-2993 OT Time Calculation (min): 27 min  Charges: OT General Charges $OT Visit: 1 Visit OT Treatments $Self Care/Home Management : 23-37 mins  Lanier Clam., COTA/L Acute Rehabilitation Services [phone] [place] 07/18/2019, 12:59 PM

## 2019-07-18 NOTE — Progress Notes (Signed)
Physical Therapy Treatment Patient Details Name: Mercedes [name] MRN: [id number] DOB: 1955-11-03 Today's Date: 07/18/2019    History of Present Illness Mercedes [name] is a  63 y.o. female with a diagnosis of Gangrene Left Foot who failed conservative measures and elected for surgical management L transmetatarsal amputation    PT Comments    Patient received in recliner, agrees to PT session. Groggy, reports feeling a little out of it today. Patient demonstrates safe mobility and ability to maintain L NWB with mobility. Transfers sit to stand with supervision/min guard, ambulated 20-25 feet with RW and min guard. No lob or difficulty noted. Patient will be safe to return home with family support.        Follow Up Recommendations  Supervision - Intermittent;No PT follow up     Equipment Recommendations  None recommended by PT    Recommendations for Other Services       Precautions / Restrictions Precautions Precautions: Fall Precaution Comments: wound vac L foot Restrictions Weight Bearing Restrictions: Yes LLE Weight Bearing: Non weight bearing    Mobility  Bed Mobility               General bed mobility comments: received in recliner  Transfers Overall transfer level: Needs assistance Equipment used: Rolling walker (2 wheeled) Transfers: Sit to/from Stand Sit to Stand: Min guard            Ambulation/Gait Ambulation/Gait assistance: Min guard Gait Distance (Feet): 25 Feet Assistive device: Rolling walker (2 wheeled) Gait Pattern/deviations: Step-to pattern Gait velocity: decreased   General Gait Details: Pt able to maintain NWB LLE. Min guard for safety. No LOB noted.   Stairs             Wheelchair Mobility    Modified Rankin (Stroke Patients Only)       Balance Overall balance assessment: Needs assistance Sitting-balance support: No upper extremity supported Sitting balance-Leahy Scale: Good     Standing balance support:  Bilateral upper extremity supported;During functional activity Standing balance-Leahy Scale: Fair Standing balance comment: reliant on RW due to NWB LLE, demonstrates safe mobility with RW                            Cognition Arousal/Alertness: Awake/alert;Lethargic Behavior During Therapy: WFL for tasks assessed/performed Overall Cognitive Status: Within Functional Limits for tasks assessed                                        Exercises      General Comments        Pertinent Vitals/Pain Pain Assessment: 0-10 Pain Score: 3  Pain Location: L foot with mobility Pain Descriptors / Indicators: Aching;Sore Pain Intervention(s): Limited activity within patient's tolerance;Monitored during session;Repositioned;Premedicated before session    Home Living                      Prior Function            PT Goals (current goals can now be found in the care plan section) Acute Rehab PT Goals Patient Stated Goal: go home PT Goal Formulation: With patient Time For Goal Achievement: 07/24/19 Potential to Achieve Goals: Good Progress towards PT goals: Progressing toward goals    Frequency    Min 3X/week      PT Plan Current plan remains appropriate  Co-evaluation              AM-PAC PT "6 Clicks" Mobility   Outcome Measure  Help needed turning from your back to your side while in a flat bed without using bedrails?: None Help needed moving from lying on your back to sitting on the side of a flat bed without using bedrails?: None Help needed moving to and from a bed to a chair (including a wheelchair)?: A Little Help needed standing up from a chair using your arms (e.g., wheelchair or bedside chair)?: A Little Help needed to walk in hospital room?: A Little Help needed climbing 3-5 steps with a railing? : A Little 6 Click Score: 20    End of Session Equipment Utilized During Treatment: Gait belt Activity Tolerance: Patient  tolerated treatment well Patient left: in chair;with call bell/phone within reach Nurse Communication: Mobility status PT Visit Diagnosis: Difficulty in walking, not elsewhere classified (R26.2);Pain Pain - Right/Left: Left Pain - part of body: Ankle and joints of foot     Time: 1135-1153 PT Time Calculation (min) (ACUTE ONLY): 18 min  Charges:  $Gait Training: 8-22 mins                     Kaysan Peixoto, PT, GCS 07/18/19,12:05 PM

## 2019-07-18 NOTE — TOC Progression Note (Signed)
Transition of Care [facility]) - Progression Note    Patient Details  Name: Mercedes [name] MRN: [id number] Date of Birth: [birth year]  [facility]) CM/SW Contact  Zenon Mayo, RN Phone Number: 07/18/2019, 12:59 PM  Clinical Narrative:    Patient is for dc today, per pt/ot eval no needs.          Expected Discharge Plan and Services           Expected Discharge Date: 07/18/19                                     Social Determinants of Health (SDOH) Interventions    Readmission Risk Interventions Readmission Risk Prevention Plan 05/16/2019  Transportation Screening Complete  PCP or Specialist Appt within 5-7 Days Complete  Home Care Screening Complete  Medication Review (RN CM) Complete  Some recent data might be hidden

## 2019-07-21 ENCOUNTER — Other Ambulatory Visit: Payer: Self-pay

## 2019-07-21 MED ORDER — LISINOPRIL 10 MG PO TABS: 10.0000 mg | ORAL_TABLET | Freq: Every day | ORAL | 2 refills | Status: DC

## 2019-07-21 NOTE — Discharge Summary (Signed)
Discharge Diagnoses:  Active Problems:   Gangrene of left foot ([place])   History of transmetatarsal amputation of left foot ([place])   Surgeries: Procedure(s): LEFT TRANSMETATARSAL AMPUTATION on 07/16/2019    Consultants:   Discharged Condition: Improved  Hospital Course: Mercedes [name] is an 63 y.o. female who was admitted 07/16/2019 with a chief complaint of left foot pain, with a final diagnosis of Gangrene Left Foot.  Patient was brought to the operating room on 07/16/2019 and underwent Procedure(s): LEFT TRANSMETATARSAL AMPUTATION.    Patient was given perioperative antibiotics:  Anti-infectives (From admission, onward)   Start     Dose/Rate Route Frequency Ordered Stop   07/16/19 1430  ceFAZolin (ANCEF) IVPB 1 g/50 mL premix     1 g 100 mL/hr over 30 Minutes Intravenous Every 6 hours 07/16/19 1351 07/17/19 0525   07/16/19 0700  ceFAZolin (ANCEF) IVPB 2g/100 mL premix     2 g 200 mL/hr over 30 Minutes Intravenous On call to O.R. 07/16/19 2993 07/16/19 0909    .  Patient was given sequential compression devices, early ambulation, and aspirin for DVT prophylaxis.  Recent vital signs: No data found..  Recent laboratory studies: No results found.  Discharge Medications:   Allergies as of 07/18/2019      Reactions   Brilinta [ticagrelor] Anaphylaxis, Other (See Comments)   Also, chest tightness   Latex Rash      Medication List    STOP taking these medications   acetaminophen 500 MG tablet Commonly known as: TYLENOL   HYDROcodone-acetaminophen 5-325 MG tablet Commonly known as: NORCO/VICODIN   oxyCODONE-acetaminophen 5-325 MG tablet Commonly known as: PERCOCET/ROXICET   rosuvastatin 10 MG tablet Commonly known as: CRESTOR     TAKE these medications   aspirin 81 MG EC tablet Take 1 tablet (81 mg total) by mouth daily.   Eliquis 5 MG Tabs tablet Generic drug: apixaban TAKE 1 TABLET BY MOUTH TWICE DAILY What changed: how much to take   Ensure Take 237  mLs by mouth daily.   lisinopril 10 MG tablet Commonly known as: ZESTRIL Take 1 tablet (10 mg total) by mouth daily.   multivitamin with minerals Tabs tablet Take 1 tablet by mouth daily.   nitroGLYCERIN 0.4 MG SL tablet Commonly known as: NITROSTAT Place 1 tablet (0.4 mg total) under the tongue every 5 (five) minutes as needed for chest pain.   oxyCODONE 5 MG immediate release tablet Commonly known as: Oxy IR/ROXICODONE Take 1-2 tablets (5-10 mg total) by mouth every 4 (four) hours as needed for moderate pain (pain score 4-6).       Diagnostic Studies: No results found.  Patient benefited maximally from their hospital stay and there were no complications.     Disposition:  Discharge Instructions    Call MD / Call 911   Complete by: As directed    If you experience chest pain or shortness of breath, CALL 911 and be transported to the hospital emergency room.  If you develope a fever above 101 F, pus (white drainage) or increased drainage or redness at the wound, or calf pain, call your surgeon's office.   Constipation Prevention   Complete by: As directed    Drink plenty of fluids.  Prune juice may be helpful.  You may use a stool softener, such as Colace (over the counter) 100 mg twice a day.  Use MiraLax (over the counter) for constipation as needed.   Diet - low sodium heart healthy   Complete by:  As directed    Discharge instructions   Complete by: As directed    Elevate leg. Follow up Dr. Lajoyce Corners 1 week   Increase activity slowly as tolerated   Complete by: As directed    Neg Press Wound Therapy / Incisional   Complete by: As directed    Attach to pravena pump and instruct patient. Vac will be removed in office in 1 week   Neg Press Wound Therapy / Incisional   Complete by: As directed    Show Patient how to attach Prevena Pump. Will be removed at 1st post op     Follow-up Information    Nadara Mustard, MD Follow up in 1 week(s).   Specialty: Orthopedic  Surgery Contact information: [address] [place] [address] [phone]            Signed: [place] Renita Brocks 07/21/2019, 8:40 AM

## 2019-07-22 ENCOUNTER — Other Ambulatory Visit: Payer: Self-pay

## 2019-07-23 ENCOUNTER — Other Ambulatory Visit: Payer: Self-pay

## 2019-07-23 ENCOUNTER — Ambulatory Visit (INDEPENDENT_AMBULATORY_CARE_PROVIDER_SITE_OTHER): Payer: Medicare Other | Admitting: Physician Assistant

## 2019-07-23 ENCOUNTER — Encounter: Payer: Self-pay | Admitting: Physician Assistant

## 2019-07-23 VITALS — Ht 66.0 in | Wt 101.0 lb

## 2019-07-23 DIAGNOSIS — Z89432 Acquired absence of left foot: Secondary | ICD-10-CM

## 2019-07-23 MED ORDER — OXYCODONE HCL 5 MG PO TABS: 5.0000 mg | ORAL_TABLET | ORAL | 0 refills | Status: DC | PRN

## 2019-07-23 NOTE — Progress Notes (Signed)
Office Visit Note   Patient: Mercedes [name]           Date of Birth: 02/01/1956           MRN: [id number] Visit Date: 07/23/2019              Requested by: Pincus Sanes, MD [address] [place],  [place] [address] PCP: Pincus Sanes, MD  Chief Complaint  Patient presents with  . Left Foot - Routine Post Op    07/16/19 left transmet amputation       HPI: This is a pleasant woman who is 1 week status post transmetatarsal amputation on the left she is doing well she has pain that is taking pain medication in reasonable amounts  Assessment & Plan: Visit Diagnoses: No diagnosis found.  Plan: The wound VAC was removed she will do dry dressing changes daily and may gently cleanse the area with a mild soap follow-up in 1 week  Follow-Up Instructions: No follow-ups on file.   Ortho Exam  Patient is alert, oriented, no adenopathy, well-dressed, normal affect, normal respiratory effort. Left foot: Healing surgical incision wound edges remain well approximated swelling is well controlled one small area of skin maceration  Imaging: No results found. No images are attached to the encounter.  Labs: Lab Results  Component Value Date   HGBA1C 6.4 11/05/2018   HGBA1C 6.5 03/29/2017   HGBA1C 6.2 09/29/2016   ESRSEDRATE 24 (H) 11/21/2013   ESRSEDRATE 2 09/09/2009   ESRSEDRATE 3 08/17/2009   CRP <0.5 (L) 11/21/2013   CRP 0.1 (L) 08/17/2009   LABURIC 4.9 08/08/2013   REPTSTATUS 10/25/2018 FINAL 10/20/2018   CULT NO GROWTH 5 DAYS 10/20/2018   LABORGA ESCHERICHIA COLI (A) 10/20/2018     Lab Results  Component Value Date   ALBUMIN 3.2 (L) 05/22/2019   ALBUMIN 3.5 05/09/2019   ALBUMIN 3.4 (L) 05/01/2019   LABURIC 4.9 08/08/2013    Lab Results  Component Value Date   MG 2.3 07/18/2016   MG 2.3 07/18/2016   MG 3.0 (H) 07/17/2016   No results found for: VD25OH  No results found for: PREALBUMIN CBC EXTENDED Latest Ref Rng & Units 07/16/2019 05/22/2019 05/16/2019  WBC  4.0 - 10.5 K/uL 4.6 7.1 7.3  RBC 3.87 - 5.11 MIL/uL 5.37(H) 4.27 3.76(L)  HGB 12.0 - 15.0 g/dL 75.1 10.7(L) 9.3(L)  HCT 36.0 - 46.0 % 44.6 35.0(L) 30.2(L)  PLT 150 - 400 K/uL 363 519(H) 357  NEUTROABS 1.7 - 7.7 K/uL - 3.5 -  LYMPHSABS 0.7 - 4.0 K/uL - 2.5 -     Body mass index is 16.3 kg/m.  Orders:  No orders of the defined types were placed in this encounter.  No orders of the defined types were placed in this encounter.    Procedures: No procedures performed  Clinical Data: No additional findings.  ROS:  All other systems negative, except as noted in the HPI. Review of Systems  Objective: Vital Signs: Ht 5\' 6"  (1.676 m)   Wt 101 lb (45.8 kg)   BMI 16.30 kg/m   Specialty Comments:  No specialty comments available.  PMFS History: Patient Active Problem List   Diagnosis Date Noted  . 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 Gangrene of left foot (HCC)   . Protein-calorie malnutrition, severe 05/15/2019  . Critical lower limb ischemia 05/09/2019  . Dyspnea 02/01/2019  . Weight loss, unintentional 02/01/2019  . Epigastric pain 02/01/2019  .  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 Acute pyelonephritis 10/20/2018  . Acute renal failure superimposed on stage 2 chronic kidney disease (HCC) 10/20/2018  . Diabetes (HCC) 04/01/2017  . Hyperthyroidism 02/21/2017  . Low mean corpuscular volume (MCV) 09/30/2016  . Coronary artery disease involving native heart without angina pectoris   . Hx of CABG 07/17/2016  . Paroxysmal atrial fibrillation (HCC) 07/12/2016  . NSTEMI (non-ST elevated myocardial infarction) (HCC) 07/11/2016  . History of arterial bypass of lower extremity 12/30/2015  . Claudication of right lower extremity (HCC) 12/30/2015  . Phantom limb pain-Left great toe 04/29/2015  . Seizure (HCC)   . Atrial flutter with rapid ventricular response vs. SVT 10/27/2014    Class: Acute  . Atherosclerosis of native artery of left lower  extremity with rest pain (HCC) 12/18/2013  . PAD (peripheral artery disease) (HCC) 12/02/2013  . SLE (systemic lupus erythematosus) (HCC)   . CAD - S/P Takotsubo MI in 2000, CFX DES Sept 2014 05/25/2013  . Acute MI, inferior wall (HCC) 05/19/2013  . 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 Tachycardia-bradycardia (HCC) 03/09/2010  . PVD- s/p Lt [facility] April 2015 10/29/2009  . Dyslipidemia 08/26/2009  . Essential hypertension 08/26/2009  . CORONARY ATHEROSCLEROSIS NATIVE CORONARY ARTERY 08/26/2009  . 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 Rheumatoid arthritis (HCC) 08/26/2009   Past Medical History:  Diagnosis Date  . Abnormal LFTs    a. in the past when on Lipitor  . Anemia    as a young woman  . Anxiety   . Arnold-Chiari malformation, type I (HCC)    MRI brain 02/2010  . CAD (coronary artery disease)    a. NSTEMI 07/2009: LHC - D2 40%, LAD irreg., EF 50% with apical AK (tako-tsubo CM);  b. Inf STEMI (04/2013): Tx Promus DES to CFX;  c. 08/2013 Lexi CL: No ischemia, dist ant/ap/inf-ap infarct, EF  d. 06/2016 NSTEMI with LM disease: s/p CABG on 07/17/2016 w/ LIMA-LAD, SVG-OM, and SVG-dRCA.  [name] DEPRESSION   . Dizziness    ? CVA 01/2010 - carotid dopplers with no ICA stenosis  . GERD (gastroesophageal reflux disease)   . HYPERLIPIDEMIA   . HYPERTENSION   . MI (myocardial infarction) (HCC)   . PAD (peripheral artery disease) (HCC)    s/p L fem pop 11/2013 in setting of 1st toe osteo/gangrene  . Paroxysmal atrial fibrillation (HCC)    a. anticoagulated with Eliquis 10/2014  . 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 RA (rheumatoid arthritis) (HCC)    prior tx by Dr. Dareen Piano  . Sjogren's syndrome (HCC) 06/02/13   pt denies this (12/01/13)  . SLE (systemic lupus erythematosus) (HCC) dx 05/2013   follows with rheum Dareen Piano)  . Tachy-brady syndrome (HCC)    a. s/p STJ Leadless pacemaker 11-04-2014 Dr Johney Frame  . 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 Arthritis Mother   . Emphysema Mother   . Arthritis Father   . COPD Father   . Heart disease Father   . Diabetes Paternal Aunt        paternal Aunts  . Heart disease Paternal Aunt   . Thyroid disease Neg Hx     Past Surgical History:  Procedure Laterality Date  . ABDOMINAL AORTAGRAM N/A 11/24/2013   Procedure: ABDOMINAL AORTAGRAM WITH BILATERAL RUNOFF WITH POSSIBLE INTERVENTION;  Surgeon: Chuck Hint,  MD;  Location: [facility] CATH LAB;  Service: Cardiovascular;  Laterality: N/A;  . ABDOMINAL AORTOGRAM W/LOWER EXTREMITY N/A 04/25/2019   Procedure: ABDOMINAL AORTOGRAM W/LOWER EXTREMITY;  Surgeon: Sherren Kerns, MD;  Location: [facility] INVASIVE CV LAB;  Service: Cardiovascular;  Laterality: N/A;  . ABDOMINAL AORTOGRAM W/LOWER EXTREMITY Bilateral 05/15/2019   Procedure: ABDOMINAL AORTOGRAM W/LOWER EXTREMITY;  Surgeon: Nada Libman, MD;  Location: [facility] INVASIVE CV LAB;  Service: Cardiovascular;  Laterality: Bilateral;  . AMPUTATION Left 12/02/2013   Procedure: LEFT 1ST TOE AMPUTATION;  Surgeon: Sherren Kerns, MD;  Location[facility] OR;  Service: Vascular;  Laterality: Left;  . AMPUTATION Left 07/16/2019   Procedure: LEFT TRANSMETATARSAL AMPUTATION;  Surgeon: Nadara Mustard, MD;  Location: [facility] OR;  Service: Orthopedics;  Laterality: Left;  . CARDIAC CATHETERIZATION N/A 07/12/2016   Procedure: Left Heart Cath and Coronary Angiography;  Surgeon: Lennette Bihari, MD;  Location: [facility] INVASIVE CV LAB;  Service: Cardiovascular;  Laterality: N/A;  . CARDIAC CATHETERIZATION N/A 07/14/2016   Procedure: Intravascular Ultrasound/IVUS;  Surgeon: Yvonne Kendall, MD;  Location: [facility] INVASIVE CV LAB;  Service: Cardiovascular;  Laterality: N/A;  . COLONOSCOPY    . CORONARY ANGIOPLASTY  04/2013  . CORONARY ARTERY BYPASS GRAFT N/A 07/17/2016   Procedure: CORONARY ARTERY BYPASS GRAFTING (CABG)x3  using right greater saphenous vein harvested endoscopiclly and left internal mammary artery;  Surgeon: Kerin Perna, MD;  Location: [facility] OR;  Service: Open Heart Surgery;  Laterality: N/A;  . ENDARTERECTOMY FEMORAL Left 05/12/2019   Procedure: LEFT Femoral Endarterectomy;  Surgeon: Sherren Kerns, MD;  Location: [facility] OR;  Service: Vascular;  Laterality: Left;  . FEMORAL-POPLITEAL BYPASS GRAFT Left 12/02/2013   Procedure:  LEFT FEMORAL-BELOW KNEE POPLITEAL ARTERY BYPASS GRAFT;  Surgeon: Sherren Kerns, MD;  Location: [facility] OR;  Service: Vascular;  Laterality: Left;  . LEFT HEART CATHETERIZATION WITH CORONARY ANGIOGRAM N/A 05/19/2013   Procedure: LEFT HEART CATHETERIZATION WITH CORONARY ANGIOGRAM;  Surgeon: Micheline Chapman, MD;  Location: [facility] CATH LAB;  Service: Cardiovascular;  Laterality: N/A;  . LEFT HEART CATHETERIZATION WITH CORONARY ANGIOGRAM N/A 10/28/2014   Procedure: LEFT HEART CATHETERIZATION WITH CORONARY ANGIOGRAM;  Surgeon: Iran Ouch, MD;  Location: [facility] CATH LAB;  Service: Cardiovascular;  Laterality: N/A;  . LOWER EXTREMITY ANGIOGRAM Left 05/12/2019   Procedure: Lower Extremity Angiogram;  Surgeon: Sherren Kerns, MD;  Location: [facility] OR;  Service: Vascular;  Laterality: Left;  . PATCH ANGIOPLASTY Left 05/12/2019   Procedure: Left Femoral Patch Angioplasty USING CADAVERIC SAPHENOUS VEIN;  Surgeon: Sherren Kerns, MD;  Location: [facility] OR;  Service: Vascular;  Laterality: Left;  . PERCUTANEOUS CORONARY STENT INTERVENTION (PCI-S)  05/19/2013   Procedure: PERCUTANEOUS CORONARY STENT INTERVENTION (PCI-S);  Surgeon: Micheline Chapman, MD;  Location: [facility] CATH LAB;  Service: Cardiovascular;;  . PERMANENT PACEMAKER INSERTION N/A 11/04/2014   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  . TEE WITHOUT CARDIOVERSION N/A 07/17/2016   Procedure: TRANSESOPHAGEAL ECHOCARDIOGRAM (TEE);  Surgeon: Kerin Perna, MD;  Location: [facility] OR;  Service: Open Heart Surgery;  Laterality: N/A;  .  THROMBECTOMY FEMORAL ARTERY Left 05/12/2019   Procedure: Left Ilio-Femoral Artery and Femoral-popliteal Graft Thrombectomy;  Surgeon: Sherren Kerns, MD;  Location: [facility] OR;  Service: Vascular;  Laterality: Left;  . TUBAL LIGATION    . VISCERAL ANGIOGRAPHY N/A 04/25/2019   Procedure: MESENTRIC  ANGIOGRAPHY;  Surgeon: Sherren Kerns, MD;  Location: [facility] INVASIVE CV LAB;  Service: Cardiovascular;  Laterality: N/A;  Social History   Occupational History  . Occupation: retired  Tobacco Use  . Smoking status: Former Smoker    Types: Cigarettes    Quit date: 05/19/2016    Years since quitting: 3.1  . Smokeless tobacco: Never Used  . Tobacco comment:    Substance and Sexual Activity  . Alcohol use: No    Alcohol/week: 0.0 standard drinks    Frequency: Never  . Drug use: Yes    Types: Marijuana  . Sexual activity: Not on file

## 2019-07-30 ENCOUNTER — Encounter: Payer: Self-pay | Admitting: Physician Assistant

## 2019-07-30 ENCOUNTER — Other Ambulatory Visit: Payer: Self-pay

## 2019-07-30 ENCOUNTER — Ambulatory Visit (INDEPENDENT_AMBULATORY_CARE_PROVIDER_SITE_OTHER): Payer: Medicare Other | Admitting: Physician Assistant

## 2019-07-30 VITALS — Ht 66.0 in | Wt 101.0 lb

## 2019-07-30 DIAGNOSIS — Z89432 Acquired absence of left foot: Secondary | ICD-10-CM

## 2019-07-30 NOTE — Progress Notes (Signed)
Office Visit Note   Patient: Mercedes [name]           Date of Birth: [birth year]           MRN: [id number] Visit Date: 07/30/2019              Requested by: Pincus Sanes, MD [address] [place],  [place] [address] PCP: Pincus Sanes, MD  Chief Complaint  Patient presents with  . Left Foot - Routine Post Op    07/16/2019 left TMA      HPI: The patient is 2 weeks status post transmetatarsal amputation.  She is complaining of swelling but notes that she has not been elevating her foot is much.  She has been doing daily dressing changes .   Assessment & Plan: Visit Diagnoses: No diagnosis found.  Plan: She will follow up in 1 week. We reviewed the importance of elevating her foot to reduce the swelling. She needs to make sure there is no pressure on the side of her foot where the blister occurred  Follow-Up Instructions: No follow-ups on file.   Ortho Exam  Patient is alert, oriented, no adenopathy, well-dressed, normal affect, normal respiratory effort. Incision is  Healing nicely with 2 small areas that are still healing. No necrotic areas or cellulitis strong paplable pulse. There is a 87mm blister on the lateral side of her foot. No surrounding erythema  Imaging: No results found. No images are attached to the encounter.  Labs: Lab Results  Component Value Date   HGBA1C 6.4 11/05/2018   HGBA1C 6.5 03/29/2017   HGBA1C 6.2 09/29/2016   ESRSEDRATE 24 (H) 11/21/2013   ESRSEDRATE 2 09/09/2009   ESRSEDRATE 3 08/17/2009   CRP <0.5 (L) 11/21/2013   CRP 0.1 (L) 08/17/2009   LABURIC 4.9 08/08/2013   REPTSTATUS 10/25/2018 FINAL 10/20/2018   CULT NO GROWTH 5 DAYS 10/20/2018   LABORGA ESCHERICHIA COLI (A) 10/20/2018     Lab Results  Component Value Date   ALBUMIN 3.2 (L) 05/22/2019   ALBUMIN 3.5 05/09/2019   ALBUMIN 3.4 (L) 05/01/2019   LABURIC 4.9 08/08/2013    Lab Results  Component Value Date   MG 2.3 07/18/2016   MG 2.3 07/18/2016   MG 3.0 (H) 07/17/2016    No results found for: VD25OH  No results found for: PREALBUMIN CBC EXTENDED Latest Ref Rng & Units 07/16/2019 05/22/2019 05/16/2019  WBC 4.0 - 10.5 K/uL 4.6 7.1 7.3  RBC 3.87 - 5.11 MIL/uL 5.37(H) 4.27 3.76(L)  HGB 12.0 - 15.0 g/dL 75.1 10.7(L) 9.3(L)  HCT 36.0 - 46.0 % 44.6 35.0(L) 30.2(L)  PLT 150 - 400 K/uL 363 519(H) 357  NEUTROABS 1.7 - 7.7 K/uL - 3.5 -  LYMPHSABS 0.7 - 4.0 K/uL - 2.5 -     Body mass index is 16.3 kg/m.  Orders:  No orders of the defined types were placed in this encounter.  No orders of the defined types were placed in this encounter.    Procedures: No procedures performed  Clinical Data: No additional findings.  ROS:  All other systems negative, except as noted in the HPI. Review of Systems  Objective: Vital Signs: Ht 5\' 6"  (1.676 m)   Wt 101 lb (45.8 kg)   BMI 16.30 kg/m   Specialty Comments:  No specialty comments available.  PMFS History: Patient Active Problem List   Diagnosis Date Noted  . 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 Gangrene of left  foot ([place])   . Protein-calorie malnutrition, severe 05/15/2019  . Critical lower limb ischemia 05/09/2019  . Dyspnea 02/01/2019  . Weight loss, unintentional 02/01/2019  . Epigastric pain 02/01/2019  . Renal infarct ([place]) 02/01/2019  . Acute pyelonephritis 10/20/2018  . Acute renal failure superimposed on stage 2 chronic kidney disease ([place]) 10/20/2018  . Diabetes ([place]) 04/01/2017  . Hyperthyroidism 02/21/2017  . Low mean corpuscular volume (MCV) 09/30/2016  . Coronary artery disease involving native heart without angina pectoris   . Hx of CABG 07/17/2016  . Paroxysmal atrial fibrillation ([place]) 07/12/2016  . NSTEMI (non-ST elevated myocardial infarction) ([place]) 07/11/2016  . History of arterial bypass of lower extremity 12/30/2015  . Claudication of right lower extremity ([place]) 12/30/2015  . Phantom limb pain-Left great toe 04/29/2015  . Seizure ([place])    . Atrial flutter with rapid ventricular response vs. SVT 10/27/2014    Class: Acute  . Atherosclerosis of native artery of left lower extremity with rest pain ([place]) 12/18/2013  . PAD (peripheral artery disease) ([place]) 12/02/2013  . SLE (systemic lupus erythematosus) ([place])   . CAD - S/P Takotsubo MI in 2000, CFX DES Sept 2014 05/25/2013  . Acute MI, inferior wall ([place]) 05/19/2013  . 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 Tachycardia-bradycardia ([place]) 03/09/2010  . PVD- s/p Lt [facility] April 2015 10/29/2009  . Dyslipidemia 08/26/2009  . Essential hypertension 08/26/2009  . CORONARY ATHEROSCLEROSIS NATIVE CORONARY ARTERY 08/26/2009  . Secondary cardiomyopathy ([place]) 08/26/2009  . Rheumatoid arthritis ([place]) 08/26/2009   Past Medical History:  Diagnosis Date  . Abnormal LFTs    a. in the past when on Lipitor  . Anemia    as a young woman  . Anxiety   . Arnold-Chiari malformation, type I ([place])    MRI brain 02/2010  . CAD (coronary artery disease)    a. NSTEMI 07/2009: LHC - D2 40%, LAD irreg., EF 50% with apical AK (tako-tsubo CM);  b. Inf STEMI (04/2013): Tx Promus DES to CFX;  c. 08/2013 Lexi CL: No ischemia, dist ant/ap/inf-ap infarct, EF  d. 06/2016 NSTEMI with LM disease: s/p CABG on 07/17/2016 w/ LIMA-LAD, SVG-OM, and SVG-dRCA.  [name] DEPRESSION   . Dizziness    ? CVA 01/2010 - carotid dopplers with no ICA stenosis  . GERD (gastroesophageal reflux disease)   . HYPERLIPIDEMIA   . HYPERTENSION   . MI (myocardial infarction) ([place])   . PAD (peripheral artery disease) ([place])    s/p L fem pop 11/2013 in setting of 1st toe osteo/gangrene  . Paroxysmal atrial fibrillation (HCC)    a. anticoagulated with Eliquis 10/2014  . 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 RA (rheumatoid arthritis) ([place])    prior tx by Dr. Ouida Sills  . Sjogren's syndrome ([place]) 06/02/13   pt denies this (12/01/13)  . SLE (systemic lupus erythematosus) ([place]) dx 05/2013   follows with  rheum Ouida Sills)  . Tachy-brady syndrome (HCC)    a. s/p STJ Leadless pacemaker 11-04-2014 Dr Rayann Heman  . 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 Arthritis Mother   . Emphysema Mother   . Arthritis Father   . COPD Father   . Heart disease Father   . Diabetes Paternal Aunt        paternal Aunts  . Heart disease Paternal Aunt   . Thyroid disease Neg Hx  Past Surgical History:  Procedure Laterality Date  . ABDOMINAL AORTAGRAM N/A 11/24/2013   Procedure: ABDOMINAL AORTAGRAM WITH BILATERAL RUNOFF WITH POSSIBLE INTERVENTION;  Surgeon: Chuck Hint, MD;  Location[facility] CATH LAB;  Service: Cardiovascular;  Laterality: N/A;  . ABDOMINAL AORTOGRAM W/LOWER EXTREMITY N/A 04/25/2019   Procedure: ABDOMINAL AORTOGRAM W/LOWER EXTREMITY;  Surgeon: Sherren Kerns, MD;  [facility]: [facility] INVASIVE CV LAB;  Service: Cardiovascular;  Laterality: N/A;  . ABDOMINAL AORTOGRAM W/LOWER EXTREMITY Bilateral 05/15/2019   Procedure: ABDOMINAL AORTOGRAM W/LOWER EXTREMITY;  Surgeon: Nada Libman, MD;  [facility]: [facility] INVASIVE CV LAB;  Service: Cardiovascular;  Laterality: Bilateral;  . AMPUTATION Left 12/02/2013   Procedure: LEFT 1ST TOE AMPUTATION;  Surgeon: Sherren Kerns, MD;  Location: [facility] OR;  Service: Vascular;  Laterality: Left;  . AMPUTATION Left 07/16/2019   Procedure: LEFT TRANSMETATARSAL AMPUTATION;  Surgeon: Nadara Mustard, MD;  Location: [facility] OR;  Service: Orthopedics;  Laterality: Left;  . CARDIAC CATHETERIZATION N/A 07/12/2016   Procedure: Left Heart Cath and Coronary Angiography;  Surgeon: Lennette Bihari, MD;  Location: [facility] INVASIVE CV LAB;  Service: Cardiovascular;  Laterality: N/A;  . CARDIAC CATHETERIZATION N/A 07/14/2016   Procedure: Intravascular Ultrasound/IVUS;  Surgeon: Yvonne Kendall, MD;  [facility]: [facility] INVASIVE CV LAB;  Service: Cardiovascular;  Laterality: N/A;  .  COLONOSCOPY    . CORONARY ANGIOPLASTY  04/2013  . CORONARY ARTERY BYPASS GRAFT N/A 07/17/2016   Procedure: CORONARY ARTERY BYPASS GRAFTING (CABG)x3 using right greater saphenous vein harvested endoscopiclly and left internal mammary artery;  Surgeon: Kerin Perna, MD;  Location[facility] OR;  Service: Open Heart Surgery;  Laterality: N/A;  . ENDARTERECTOMY FEMORAL Left 05/12/2019   Procedure: LEFT Femoral Endarterectomy;  Surgeon: Sherren Kerns, MD;  Location: [facility] OR;  Service: Vascular;  Laterality: Left;  . FEMORAL-POPLITEAL BYPASS GRAFT Left 12/02/2013   Procedure:  LEFT FEMORAL-BELOW KNEE POPLITEAL ARTERY BYPASS GRAFT;  Surgeon: Sherren Kerns, MD;  Location: [facility] OR;  Service: Vascular;  Laterality: Left;  . LEFT HEART CATHETERIZATION WITH CORONARY ANGIOGRAM N/A 05/19/2013   Procedure: LEFT HEART CATHETERIZATION WITH CORONARY ANGIOGRAM;  Surgeon: Micheline Chapman, MD;  Location: [facility] CATH LAB;  Service: Cardiovascular;  Laterality: N/A;  . LEFT HEART CATHETERIZATION WITH CORONARY ANGIOGRAM N/A 10/28/2014   Procedure: LEFT HEART CATHETERIZATION WITH CORONARY ANGIOGRAM;  Surgeon: Iran Ouch, MD;  [facility] CATH LAB;  Service: Cardiovascular;  Laterality: N/A;  . LOWER EXTREMITY ANGIOGRAM Left 05/12/2019   Procedure: Lower Extremity Angiogram;  Surgeon: Sherren Kerns, MD;  Location: [facility] OR;  Service: Vascular;  Laterality: Left;  . PATCH ANGIOPLASTY Left 05/12/2019   Procedure: Left Femoral Patch Angioplasty USING CADAVERIC SAPHENOUS VEIN;  Surgeon: Sherren Kerns, MD;  Location: [facility] OR;  Service: Vascular;  Laterality: Left;  . PERCUTANEOUS CORONARY STENT INTERVENTION (PCI-S)  05/19/2013   Procedure: PERCUTANEOUS CORONARY STENT INTERVENTION (PCI-S);  Surgeon: Micheline Chapman, MD;  Location: [facility] CATH LAB;  Service: Cardiovascular;;  . PERMANENT PACEMAKER INSERTION N/A 11/04/2014   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  . TEE WITHOUT CARDIOVERSION N/A 07/17/2016    Procedure: TRANSESOPHAGEAL ECHOCARDIOGRAM (TEE);  Surgeon: Kerin Perna, MD;  Location: [facility] OR;  Service: Open Heart Surgery;  Laterality: N/A;  . THROMBECTOMY FEMORAL ARTERY Left 05/12/2019   Procedure: Left Ilio-Femoral Artery and Femoral-popliteal Graft Thrombectomy;  Surgeon: Sherren Kerns, MD;  Location: [facility] OR;  Service: Vascular;  Laterality: Left;  . TUBAL LIGATION    . VISCERAL  ANGIOGRAPHY N/A 04/25/2019   Procedure: MESENTRIC  ANGIOGRAPHY;  Surgeon: Sherren Kerns, MD;  Location: [facility] INVASIVE CV LAB;  Service: Cardiovascular;  Laterality: N/A;   Social History   Occupational History  . Occupation: retired  Tobacco Use  . Smoking status: Former Smoker    Types: Cigarettes    Quit date: 05/19/2016    Years since quitting: 3.1  . Smokeless tobacco: Never Used  . Tobacco comment:    Substance and Sexual Activity  . Alcohol use: No    Alcohol/week: 0.0 standard drinks    Frequency: Never  . Drug use: Yes    Types: Marijuana  . Sexual activity: Not on file

## 2019-08-06 ENCOUNTER — Other Ambulatory Visit: Payer: Self-pay

## 2019-08-06 ENCOUNTER — Encounter: Payer: Self-pay | Admitting: Physician Assistant

## 2019-08-06 ENCOUNTER — Ambulatory Visit (INDEPENDENT_AMBULATORY_CARE_PROVIDER_SITE_OTHER): Payer: Medicare Other | Admitting: Physician Assistant

## 2019-08-06 VITALS — Ht 66.0 in | Wt 101.0 lb

## 2019-08-06 DIAGNOSIS — Z89432 Acquired absence of left foot: Secondary | ICD-10-CM

## 2019-08-06 MED ORDER — GABAPENTIN 300 MG PO CAPS: 300.0000 mg | ORAL_CAPSULE | Freq: Every day | ORAL | 1 refills | Status: DC

## 2019-08-06 NOTE — Progress Notes (Signed)
Office Visit Note   Patient: Mercedes [name]           Date of Birth: 05-26-1956           MRN: [id number] Visit Date: 08/06/2019              Requested by: Pincus Sanes, MD [address] [place],  [place] [address] PCP: Pincus Sanes, MD  Chief Complaint  Patient presents with  . Left Foot - Routine Post Op    07/16/2019 left TMA      HPI: The patient presents today not quite 3 weeks status post transmetatarsal amputation overall she is doing well and her swelling is gone down she is having stabbing pain that occurs during the nighttime hours and wakes her up  Assessment & Plan: Visit Diagnoses: No diagnosis found.  Plan: We will try her on 300 mg of Neurontin in the evenings to see if this helps she will continue to do dressing changes and we will remove the stitches in 1 week  Follow-Up Instructions: No follow-ups on file.   Ortho Exam  Patient is alert, oriented, no adenopathy, well-dressed, normal affect, normal respiratory effort. Focused examination of her foot demonstrates no cellulitis and a palpable pulse her incision is healing nicely with healthy wound edges.  Imaging: No results found. No images are attached to the encounter.  Labs: Lab Results  Component Value Date   HGBA1C 6.4 11/05/2018   HGBA1C 6.5 03/29/2017   HGBA1C 6.2 09/29/2016   ESRSEDRATE 24 (H) 11/21/2013   ESRSEDRATE 2 09/09/2009   ESRSEDRATE 3 08/17/2009   CRP <0.5 (L) 11/21/2013   CRP 0.1 (L) 08/17/2009   LABURIC 4.9 08/08/2013   REPTSTATUS 10/25/2018 FINAL 10/20/2018   CULT NO GROWTH 5 DAYS 10/20/2018   LABORGA ESCHERICHIA COLI (A) 10/20/2018     Lab Results  Component Value Date   ALBUMIN 3.2 (L) 05/22/2019   ALBUMIN 3.5 05/09/2019   ALBUMIN 3.4 (L) 05/01/2019   LABURIC 4.9 08/08/2013    Lab Results  Component Value Date   MG 2.3 07/18/2016   MG 2.3 07/18/2016   MG 3.0 (H) 07/17/2016   No results found for: VD25OH  No results found for: PREALBUMIN CBC EXTENDED  Latest Ref Rng & Units 07/16/2019 05/22/2019 05/16/2019  WBC 4.0 - 10.5 K/uL 4.6 7.1 7.3  RBC 3.87 - 5.11 MIL/uL 5.37(H) 4.27 3.76(L)  HGB 12.0 - 15.0 g/dL 19.1 10.7(L) 9.3(L)  HCT 36.0 - 46.0 % 44.6 35.0(L) 30.2(L)  PLT 150 - 400 K/uL 363 519(H) 357  NEUTROABS 1.7 - 7.7 K/uL - 3.5 -  LYMPHSABS 0.7 - 4.0 K/uL - 2.5 -     Body mass index is 16.3 kg/m.  Orders:  No orders of the defined types were placed in this encounter.  Meds ordered this encounter  Medications  . gabapentin (NEURONTIN) 300 MG capsule    Sig: Take 1 capsule (300 mg total) by mouth at bedtime. 3 times a day when necessary neuropathy pain    Dispense:  30 capsule    Refill:  1     Procedures: No procedures performed  Clinical Data: No additional findings.  ROS:  All other systems negative, except as noted in the HPI. Review of Systems  Objective: Vital Signs: Ht 5\' 6"  (1.676 m)   Wt 101 lb (45.8 kg)   BMI 16.30 kg/m   Specialty Comments:  No specialty comments available.  PMFS History: Patient Active Problem List  Diagnosis Date Noted  . 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 Gangrene of left foot (HCC)   . Protein-calorie malnutrition, severe 05/15/2019  . Critical lower limb ischemia 05/09/2019  . Dyspnea 02/01/2019  . Weight loss, unintentional 02/01/2019  . Epigastric pain 02/01/2019  . 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 Acute pyelonephritis 10/20/2018  . Acute renal failure superimposed on stage 2 chronic kidney disease (HCC) 10/20/2018  . Diabetes (HCC) 04/01/2017  . Hyperthyroidism 02/21/2017  . Low mean corpuscular volume (MCV) 09/30/2016  . Coronary artery disease involving native heart without angina pectoris   . Hx of CABG 07/17/2016  . Paroxysmal atrial fibrillation (HCC) 07/12/2016  . NSTEMI (non-ST elevated myocardial infarction) (HCC) 07/11/2016  . History of arterial bypass of lower extremity 12/30/2015  . Claudication of right lower  extremity (HCC) 12/30/2015  . Phantom limb pain-Left great toe 04/29/2015  . Seizure (HCC)   . Atrial flutter with rapid ventricular response vs. SVT 10/27/2014    Class: Acute  . Atherosclerosis of native artery of left lower extremity with rest pain (HCC) 12/18/2013  . PAD (peripheral artery disease) (HCC) 12/02/2013  . SLE (systemic lupus erythematosus) (HCC)   . CAD - S/P Takotsubo MI in 2000, CFX DES Sept 2014 05/25/2013  . Acute MI, inferior wall (HCC) 05/19/2013  . 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 Tachycardia-bradycardia (HCC) 03/09/2010  . PVD- s/p Lt [facility] April 2015 10/29/2009  . Dyslipidemia 08/26/2009  . Essential hypertension 08/26/2009  . CORONARY ATHEROSCLEROSIS NATIVE CORONARY ARTERY 08/26/2009  . 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 Rheumatoid arthritis (HCC) 08/26/2009   Past Medical History:  Diagnosis Date  . Abnormal LFTs    a. in the past when on Lipitor  . Anemia    as a young woman  . Anxiety   . Arnold-Chiari malformation, type I (HCC)    MRI brain 02/2010  . CAD (coronary artery disease)    a. NSTEMI 07/2009: LHC - D2 40%, LAD irreg., EF 50% with apical AK (tako-tsubo CM);  b. Inf STEMI (04/2013): Tx Promus DES to CFX;  c. 08/2013 Lexi CL: No ischemia, dist ant/ap/inf-ap infarct, EF  d. 06/2016 NSTEMI with LM disease: s/p CABG on 07/17/2016 w/ LIMA-LAD, SVG-OM, and SVG-dRCA.  [name] DEPRESSION   . Dizziness    ? CVA 01/2010 - carotid dopplers with no ICA stenosis  . GERD (gastroesophageal reflux disease)   . HYPERLIPIDEMIA   . HYPERTENSION   . MI (myocardial infarction) (HCC)   . PAD (peripheral artery disease) (HCC)    s/p L fem pop 11/2013 in setting of 1st toe osteo/gangrene  . Paroxysmal atrial fibrillation (HCC)    a. anticoagulated with Eliquis 10/2014  . 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 RA (rheumatoid arthritis) (HCC)    prior tx by Dr. Dareen Piano  . Sjogren's syndrome (HCC) 06/02/13   pt  denies this (12/01/13)  . SLE (systemic lupus erythematosus) (HCC) dx 05/2013   follows with rheum Dareen Piano)  . Tachy-brady syndrome (HCC)    a. s/p STJ Leadless pacemaker 11-04-2014 Dr Johney Frame  . 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 Arthritis Mother   . Emphysema Mother   . Arthritis Father   . COPD Father   . Heart disease Father   . Diabetes Paternal Aunt  paternal Aunts  . Heart disease Paternal Aunt   . Thyroid disease Neg Hx     Past Surgical History:  Procedure Laterality Date  . ABDOMINAL AORTAGRAM N/A 11/24/2013   Procedure: ABDOMINAL AORTAGRAM WITH BILATERAL RUNOFF WITH POSSIBLE INTERVENTION;  Surgeon: Chuck Hint, MD;  Location: [facility] CATH LAB;  Service: Cardiovascular;  Laterality: N/A;  . ABDOMINAL AORTOGRAM W/LOWER EXTREMITY N/A 04/25/2019   Procedure: ABDOMINAL AORTOGRAM W/LOWER EXTREMITY;  Surgeon: Sherren Kerns, MD;  Location: [facility] INVASIVE CV LAB;  Service: Cardiovascular;  Laterality: N/A;  . ABDOMINAL AORTOGRAM W/LOWER EXTREMITY Bilateral 05/15/2019   Procedure: ABDOMINAL AORTOGRAM W/LOWER EXTREMITY;  Surgeon: Nada Libman, MD;  Location: [facility] INVASIVE CV LAB;  Service: Cardiovascular;  Laterality: Bilateral;  . AMPUTATION Left 12/02/2013   Procedure: LEFT 1ST TOE AMPUTATION;  Surgeon: Sherren Kerns, MD;  Location: [facility] OR;  Service: Vascular;  Laterality: Left;  . AMPUTATION Left 07/16/2019   Procedure: LEFT TRANSMETATARSAL AMPUTATION;  Surgeon: Nadara Mustard, MD;  Location: [facility] OR;  Service: Orthopedics;  Laterality: Left;  . CARDIAC CATHETERIZATION N/A 07/12/2016   Procedure: Left Heart Cath and Coronary Angiography;  Surgeon: Lennette Bihari, MD;  Location: [facility] INVASIVE CV LAB;  Service: Cardiovascular;  Laterality: N/A;  . CARDIAC CATHETERIZATION N/A 07/14/2016   Procedure: Intravascular Ultrasound/IVUS;  Surgeon: Yvonne Kendall,  MD;  Location: [facility] INVASIVE CV LAB;  Service: Cardiovascular;  Laterality: N/A;  . COLONOSCOPY    . CORONARY ANGIOPLASTY  04/2013  . CORONARY ARTERY BYPASS GRAFT N/A 07/17/2016   Procedure: CORONARY ARTERY BYPASS GRAFTING (CABG)x3 using right greater saphenous vein harvested endoscopiclly and left internal mammary artery;  Surgeon: Kerin Perna, MD;  Location: [facility] OR;  Service: Open Heart Surgery;  Laterality: N/A;  . ENDARTERECTOMY FEMORAL Left 05/12/2019   Procedure: LEFT Femoral Endarterectomy;  Surgeon: Sherren Kerns, MD;  Location: [facility] OR;  Service: Vascular;  Laterality: Left;  . FEMORAL-POPLITEAL BYPASS GRAFT Left 12/02/2013   Procedure:  LEFT FEMORAL-BELOW KNEE POPLITEAL ARTERY BYPASS GRAFT;  Surgeon: Sherren Kerns, MD;  Location: [facility] OR;  Service: Vascular;  Laterality: Left;  . LEFT HEART CATHETERIZATION WITH CORONARY ANGIOGRAM N/A 05/19/2013   Procedure: LEFT HEART CATHETERIZATION WITH CORONARY ANGIOGRAM;  Surgeon: Micheline Chapman, MD;  Location: [facility] CATH LAB;  Service: Cardiovascular;  Laterality: N/A;  . LEFT HEART CATHETERIZATION WITH CORONARY ANGIOGRAM N/A 10/28/2014   Procedure: LEFT HEART CATHETERIZATION WITH CORONARY ANGIOGRAM;  Surgeon: Iran Ouch, MD;  [facility]: [facility] CATH LAB;  Service: Cardiovascular;  Laterality: N/A;  . LOWER EXTREMITY ANGIOGRAM Left 05/12/2019   Procedure: Lower Extremity Angiogram;  Surgeon: Sherren Kerns, MD;  Location: [facility] OR;  Service: Vascular;  Laterality: Left;  . PATCH ANGIOPLASTY Left 05/12/2019   Procedure: Left Femoral Patch Angioplasty USING CADAVERIC SAPHENOUS VEIN;  Surgeon: Sherren Kerns, MD;  Location: [facility] OR;  Service: Vascular;  Laterality: Left;  . PERCUTANEOUS CORONARY STENT INTERVENTION (PCI-S)  05/19/2013   Procedure: PERCUTANEOUS CORONARY STENT INTERVENTION (PCI-S);  Surgeon: Micheline Chapman, MD;  Location: [facility] CATH LAB;  Service: Cardiovascular;;  . PERMANENT PACEMAKER INSERTION N/A 11/04/2014   STJ Leadless pacemaker implanted  by Dr Johney Frame for tachy/brady  . TEE WITHOUT CARDIOVERSION N/A 07/17/2016   Procedure: TRANSESOPHAGEAL ECHOCARDIOGRAM (TEE);  Surgeon: Kerin Perna, MD;  Location: [facility] OR;  Service: Open Heart Surgery;  Laterality: N/A;  . THROMBECTOMY FEMORAL ARTERY Left 05/12/2019   Procedure: Left Ilio-Femoral Artery and Femoral-popliteal Graft Thrombectomy;  Surgeon: Sherren Kerns, MD;  Location: [facility] OR;  Service: Vascular;  Laterality: Left;  . TUBAL LIGATION    . VISCERAL ANGIOGRAPHY N/A 04/25/2019   Procedure: MESENTRIC  ANGIOGRAPHY;  Surgeon: Elam Dutch, MD;  Location: [place] CV LAB;  Service: Cardiovascular;  Laterality: N/A;   Social History   Occupational History  . Occupation: retired  Tobacco Use  . Smoking status: Former Smoker    Types: Cigarettes    Quit date: 05/19/2016    Years since quitting: 3.2  . Smokeless tobacco: Never Used  . Tobacco comment:    Substance and Sexual Activity  . Alcohol use: No    Alcohol/week: 0.0 standard drinks    Frequency: Never  . Drug use: Yes    Types: Marijuana  . Sexual activity: Not on file

## 2019-08-13 ENCOUNTER — Ambulatory Visit: Payer: Medicare Other | Admitting: Physician Assistant

## 2019-08-18 ENCOUNTER — Other Ambulatory Visit: Payer: Self-pay

## 2019-08-18 ENCOUNTER — Encounter: Payer: Self-pay | Admitting: Physician Assistant

## 2019-08-18 ENCOUNTER — Ambulatory Visit (INDEPENDENT_AMBULATORY_CARE_PROVIDER_SITE_OTHER): Payer: Medicare Other | Admitting: Orthopedic Surgery

## 2019-08-18 VITALS — Ht 66.0 in | Wt 101.0 lb

## 2019-08-18 DIAGNOSIS — Z89432 Acquired absence of left foot: Secondary | ICD-10-CM

## 2019-08-19 ENCOUNTER — Encounter: Payer: Self-pay | Admitting: Orthopedic Surgery

## 2019-08-19 NOTE — Progress Notes (Signed)
Office Visit Note   Patient: Mercedes [name]           Date of Birth: 12/25/1955           MRN: [id number] Visit Date: 08/18/2019              Requested by: Binnie Rail, MD [place],   [id number] PCP: Binnie Rail, MD  Chief Complaint  Patient presents with  . Left Foot - Routine Post Op    07/16/2019 Left TMA      HPI: Patient is a 63 year old woman who presents 4 weeks status post left transmetatarsal amputation.  Patient is currently on Neurontin she states the Percocet is not helping she states she gets her best relief from Tylenol.  Assessment & Plan: Visit Diagnoses: No diagnosis found.  Plan: We will plan for physical therapy for range of motion stretching and strengthening.  She is given a prescription for biotech for extra-depth shoes custom orthotic spacer and a carbon plate.  She was given a prescription for a small vive sock.  Follow-Up Instructions: Return in about 3 weeks (around 09/08/2019).   Ortho Exam  Patient is alert, oriented, no adenopathy, well-dressed, normal affect, normal respiratory effort. Examination the incision is well-healed we will harvest the sutures there is no redness no cellulitis no drainage no odor no signs of infection.  She has dorsiflexion to neutral.  Imaging: No results found. No images are attached to the encounter.  Labs: Lab Results  Component Value Date   HGBA1C 6.4 11/05/2018   HGBA1C 6.5 03/29/2017   HGBA1C 6.2 09/29/2016   ESRSEDRATE 24 (H) 11/21/2013   ESRSEDRATE 2 09/09/2009   ESRSEDRATE 3 08/17/2009   CRP <0.5 (L) 11/21/2013   CRP 0.1 (L) 08/17/2009   LABURIC 4.9 08/08/2013   REPTSTATUS 10/25/2018 FINAL 10/20/2018   CULT NO GROWTH 5 DAYS 10/20/2018   LABORGA ESCHERICHIA COLI (A) 10/20/2018     Lab Results  Component Value Date   ALBUMIN 3.2 (L) 05/22/2019   ALBUMIN 3.5 05/09/2019   ALBUMIN 3.4 (L) 05/01/2019   LABURIC 4.9 08/08/2013    Lab Results  Component Value Date   MG  2.3 07/18/2016   MG 2.3 07/18/2016   MG 3.0 (H) 07/17/2016   No results found for: VD25OH  No results found for: PREALBUMIN CBC EXTENDED Latest Ref Rng & Units 07/16/2019 05/22/2019 05/16/2019  WBC 4.0 - 10.5 K/uL 4.6 7.1 7.3  RBC 3.87 - 5.11 MIL/uL 5.37(H) 4.27 3.76(L)  HGB 12.0 - 15.0 g/dL 13.4 10.7(L) 9.3(L)  HCT 36.0 - 46.0 % 44.6 35.0(L) 30.2(L)  PLT 150 - 400 K/uL 363 519(H) 357  NEUTROABS 1.7 - 7.7 K/uL - 3.5 -  LYMPHSABS 0.7 - 4.0 K/uL - 2.5 -     Body mass index is 16.3 kg/m.  Orders:  No orders of the defined types were placed in this encounter.  No orders of the defined types were placed in this encounter.    Procedures: No procedures performed  Clinical Data: No additional findings.  ROS:  All other systems negative, except as noted in the HPI. Review of Systems  Objective: Vital Signs: Ht 5\' 6"  (1.676 m)   Wt 101 lb (45.8 kg)   BMI 16.30 kg/m   Specialty Comments:  No specialty comments available.  PMFS History: Patient Active Problem List   Diagnosis Date Noted  . History of transmetatarsal amputation of left foot ([place]) 07/17/2019  . Gangrene of left  foot (HCC)   . Protein-calorie malnutrition, severe 05/15/2019  . Critical lower limb ischemia 05/09/2019  . Dyspnea 02/01/2019  . Weight loss, unintentional 02/01/2019  . Epigastric pain 02/01/2019  . 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 Acute pyelonephritis 10/20/2018  . Acute renal failure superimposed on stage 2 chronic kidney disease (HCC) 10/20/2018  . Diabetes (HCC) 04/01/2017  . Hyperthyroidism 02/21/2017  . Low mean corpuscular volume (MCV) 09/30/2016  . Coronary artery disease involving native heart without angina pectoris   . Hx of CABG 07/17/2016  . Paroxysmal atrial fibrillation (HCC) 07/12/2016  . NSTEMI (non-ST elevated myocardial infarction) (HCC) 07/11/2016  . History of arterial bypass of lower extremity 12/30/2015  . Claudication of right lower extremity (HCC) 12/30/2015  .  Phantom limb pain-Left great toe 04/29/2015  . Seizure (HCC)   . Atrial flutter with rapid ventricular response vs. SVT 10/27/2014    Class: Acute  . Atherosclerosis of native artery of left lower extremity with rest pain (HCC) 12/18/2013  . PAD (peripheral artery disease) (HCC) 12/02/2013  . SLE (systemic lupus erythematosus) (HCC)   . CAD - S/P Takotsubo MI in 2000, CFX DES Sept 2014 05/25/2013  . Acute MI, inferior wall (HCC) 05/19/2013  . 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 Tachycardia-bradycardia (HCC) 03/09/2010  . PVD- s/p Lt [facility] April 2015 10/29/2009  . Dyslipidemia 08/26/2009  . Essential hypertension 08/26/2009  . CORONARY ATHEROSCLEROSIS NATIVE CORONARY ARTERY 08/26/2009  . 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 Rheumatoid arthritis (HCC) 08/26/2009   Past Medical History:  Diagnosis Date  . Abnormal LFTs    a. in the past when on Lipitor  . Anemia    as a young woman  . Anxiety   . Arnold-Chiari malformation, type I (HCC)    MRI brain 02/2010  . CAD (coronary artery disease)    a. NSTEMI 07/2009: LHC - D2 40%, LAD irreg., EF 50% with apical AK (tako-tsubo CM);  b. Inf STEMI (04/2013): Tx Promus DES to CFX;  c. 08/2013 Lexi CL: No ischemia, dist ant/ap/inf-ap infarct, EF  d. 06/2016 NSTEMI with LM disease: s/p CABG on 07/17/2016 w/ LIMA-LAD, SVG-OM, and SVG-dRCA.  [name] DEPRESSION   . Dizziness    ? CVA 01/2010 - carotid dopplers with no ICA stenosis  . GERD (gastroesophageal reflux disease)   . HYPERLIPIDEMIA   . HYPERTENSION   . MI (myocardial infarction) (HCC)   . PAD (peripheral artery disease) (HCC)    s/p L fem pop 11/2013 in setting of 1st toe osteo/gangrene  . Paroxysmal atrial fibrillation (HCC)    a. anticoagulated with Eliquis 10/2014  . 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 RA (rheumatoid arthritis) (HCC)    prior tx by Dr. Dareen Piano  . Sjogren's syndrome (HCC) 06/02/13   pt denies this (12/01/13)  . SLE  (systemic lupus erythematosus) (HCC) dx 05/2013   follows with rheum Dareen Piano)  . Tachy-brady syndrome (HCC)    a. s/p STJ Leadless pacemaker 11-04-2014 Dr Johney Frame  . 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 Arthritis Mother   . Emphysema Mother   . Arthritis Father   . COPD Father   . Heart disease Father   . Diabetes Paternal Aunt        paternal Aunts  . Heart disease Paternal Aunt   . Thyroid disease Neg Hx  Past Surgical History:  Procedure Laterality Date  . ABDOMINAL AORTAGRAM N/A 11/24/2013   Procedure: ABDOMINAL AORTAGRAM WITH BILATERAL RUNOFF WITH POSSIBLE INTERVENTION;  Surgeon: Chuck Hint, MD;  Location: [facility] CATH LAB;  Service: Cardiovascular;  Laterality: N/A;  . ABDOMINAL AORTOGRAM W/LOWER EXTREMITY N/A 04/25/2019   Procedure: ABDOMINAL AORTOGRAM W/LOWER EXTREMITY;  Surgeon: Sherren Kerns, MD;  Location: [facility] INVASIVE CV LAB;  Service: Cardiovascular;  Laterality: N/A;  . ABDOMINAL AORTOGRAM W/LOWER EXTREMITY Bilateral 05/15/2019   Procedure: ABDOMINAL AORTOGRAM W/LOWER EXTREMITY;  Surgeon: Nada Libman, MD;  Location: [facility] INVASIVE CV LAB;  Service: Cardiovascular;  Laterality: Bilateral;  . AMPUTATION Left 12/02/2013   Procedure: LEFT 1ST TOE AMPUTATION;  Surgeon: Sherren Kerns, MD;  Location: [facility] OR;  Service: Vascular;  Laterality: Left;  . AMPUTATION Left 07/16/2019   Procedure: LEFT TRANSMETATARSAL AMPUTATION;  Surgeon: Nadara Mustard, MD;  Location: [facility] OR;  Service: Orthopedics;  Laterality: Left;  . CARDIAC CATHETERIZATION N/A 07/12/2016   Procedure: Left Heart Cath and Coronary Angiography;  Surgeon: Lennette Bihari, MD;  Location: [facility] INVASIVE CV LAB;  Service: Cardiovascular;  Laterality: N/A;  . CARDIAC CATHETERIZATION N/A 07/14/2016   Procedure: Intravascular Ultrasound/IVUS;  Surgeon: Yvonne Kendall, MD;  Location: [facility] INVASIVE CV  LAB;  Service: Cardiovascular;  Laterality: N/A;  . COLONOSCOPY    . CORONARY ANGIOPLASTY  04/2013  . CORONARY ARTERY BYPASS GRAFT N/A 07/17/2016   Procedure: CORONARY ARTERY BYPASS GRAFTING (CABG)x3 using right greater saphenous vein harvested endoscopiclly and left internal mammary artery;  Surgeon: Kerin Perna, MD;  Location: [facility] OR;  Service: Open Heart Surgery;  Laterality: N/A;  . ENDARTERECTOMY FEMORAL Left 05/12/2019   Procedure: LEFT Femoral Endarterectomy;  Surgeon: Sherren Kerns, MD;  Location: [facility] OR;  Service: Vascular;  Laterality: Left;  . FEMORAL-POPLITEAL BYPASS GRAFT Left 12/02/2013   Procedure:  LEFT FEMORAL-BELOW KNEE POPLITEAL ARTERY BYPASS GRAFT;  Surgeon: Sherren Kerns, MD;  Location: [facility] OR;  Service: Vascular;  Laterality: Left;  . LEFT HEART CATHETERIZATION WITH CORONARY ANGIOGRAM N/A 05/19/2013   Procedure: LEFT HEART CATHETERIZATION WITH CORONARY ANGIOGRAM;  Surgeon: Micheline Chapman, MD;  Location: [facility] CATH LAB;  Service: Cardiovascular;  Laterality: N/A;  . LEFT HEART CATHETERIZATION WITH CORONARY ANGIOGRAM N/A 10/28/2014   Procedure: LEFT HEART CATHETERIZATION WITH CORONARY ANGIOGRAM;  Surgeon: Iran Ouch, MD;  Location: [facility] CATH LAB;  Service: Cardiovascular;  Laterality: N/A;  . LOWER EXTREMITY ANGIOGRAM Left 05/12/2019   Procedure: Lower Extremity Angiogram;  Surgeon: Sherren Kerns, MD;  Location: [facility] OR;  Service: Vascular;  Laterality: Left;  . PATCH ANGIOPLASTY Left 05/12/2019   Procedure: Left Femoral Patch Angioplasty USING CADAVERIC SAPHENOUS VEIN;  Surgeon: Sherren Kerns, MD;  Location: [facility] OR;  Service: Vascular;  Laterality: Left;  . PERCUTANEOUS CORONARY STENT INTERVENTION (PCI-S)  05/19/2013   Procedure: PERCUTANEOUS CORONARY STENT INTERVENTION (PCI-S);  Surgeon: Micheline Chapman, MD;  Location: [facility] CATH LAB;  Service: Cardiovascular;;  . PERMANENT PACEMAKER INSERTION N/A 11/04/2014   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    . TEE WITHOUT CARDIOVERSION N/A 07/17/2016   Procedure: TRANSESOPHAGEAL ECHOCARDIOGRAM (TEE);  Surgeon: Kerin Perna, MD;  Location: [facility] OR;  Service: Open Heart Surgery;  Laterality: N/A;  . THROMBECTOMY FEMORAL ARTERY Left 05/12/2019   Procedure: Left Ilio-Femoral Artery and Femoral-popliteal Graft Thrombectomy;  Surgeon: Sherren Kerns, MD;  Location: [name] OR;  Service: Vascular;  Laterality: Left;  . TUBAL LIGATION    .  VISCERAL ANGIOGRAPHY N/A 04/25/2019   Procedure: MESENTRIC  ANGIOGRAPHY;  Surgeon: Sherren KernsFields, Charles E, MD;  [facility];  Service: Cardiovascular;  Laterality: N/A;   Social History   Occupational History  . Occupation: retired  Tobacco Use  . Smoking status: Former Smoker    Types: Cigarettes    Quit date: 05/19/2016    Years since quitting: 3.2  . Smokeless tobacco: Never Used  . Tobacco comment:    Substance and Sexual Activity  . Alcohol use: No    Alcohol/week: 0.0 standard drinks  . Drug use: Yes    Types: Marijuana  . Sexual activity: Not on file

## 2019-08-20 ENCOUNTER — Telehealth: Payer: Self-pay | Admitting: Physician Assistant

## 2019-08-20 NOTE — Telephone Encounter (Signed)
Patient called stating that she was to receive a healing sock, but has not received on.  She would like a return phone call.  CB#[phone].  Thank you.

## 2019-08-20 NOTE — Telephone Encounter (Signed)
I called pt and advised that the rx that she received from the office at the time of her visit was for a Vive sock. Its a medical grade compression sock that is impregnated with a nano silver and this is good for both healing and swelling. Pt voiced understanding and states that she will go out today to pick up the socks.

## 2019-08-25 ENCOUNTER — Ambulatory Visit (INDEPENDENT_AMBULATORY_CARE_PROVIDER_SITE_OTHER): Payer: Medicare Other | Admitting: Physician Assistant

## 2019-08-25 ENCOUNTER — Other Ambulatory Visit: Payer: Self-pay

## 2019-08-25 ENCOUNTER — Encounter: Payer: Self-pay | Admitting: Physician Assistant

## 2019-08-25 VITALS — Ht 66.0 in | Wt 101.0 lb

## 2019-08-25 DIAGNOSIS — Z89432 Acquired absence of left foot: Secondary | ICD-10-CM

## 2019-08-25 MED ORDER — OXYCODONE HCL 5 MG PO TABS: 5.0000 mg | ORAL_TABLET | Freq: Four times a day (QID) | ORAL | 0 refills | Status: DC | PRN

## 2019-08-25 NOTE — Progress Notes (Signed)
Office Visit Note   Patient: Mercedes [name]           Date of Birth: 1955/10/10           MRN: [id number] Visit Date: 08/25/2019              Requested by: Pincus Sanes, MD [address] [place],  [place] [address] PCP: Pincus Sanes, MD  Chief Complaint  Patient presents with  . Left Foot - Routine Post Op    07/16/2019 left TMA      HPI: This is a pleasant woman who is 6 weeks status post left transmetatarsal amputation.  She is concerned because she was worried that the wound has dehisced.  She has been washing it and rubbing on it.  Sutures were harvested last week she denies any fever or chills she has not done anything to the foot in the last 48 hours so I could see how much drainage she had  Assessment & Plan: Visit Diagnoses: No diagnosis found.  Plan: I do not see any evidence of infection today she will continue to do daily dressing changes and elevate I have asked that she not rub on the foot and follow-up in 1 week  Follow-Up Instructions: No follow-ups on file.   Ortho Exam  Patient is alert, oriented, no adenopathy, well-dressed, normal affect, normal respiratory effort. Left foot: There is no erythema there is some scabbing in the wound especially medially and is it somewhat sensitive but there is no frank dehiscence and the wound edges look healthy pulses are palpable by Doppler.  I could only appreciate a spot of clear serous drainage.  There is no foul odor no cellulitis    Imaging: No results found. No images are attached to the encounter.  Labs: Lab Results  Component Value Date   HGBA1C 6.4 11/05/2018   HGBA1C 6.5 03/29/2017   HGBA1C 6.2 09/29/2016   ESRSEDRATE 24 (H) 11/21/2013   ESRSEDRATE 2 09/09/2009   ESRSEDRATE 3 08/17/2009   CRP <0.5 (L) 11/21/2013   CRP 0.1 (L) 08/17/2009   LABURIC 4.9 08/08/2013   REPTSTATUS 10/25/2018 FINAL 10/20/2018   CULT NO GROWTH 5 DAYS 10/20/2018   LABORGA ESCHERICHIA COLI (A) 10/20/2018     Lab  Results  Component Value Date   ALBUMIN 3.2 (L) 05/22/2019   ALBUMIN 3.5 05/09/2019   ALBUMIN 3.4 (L) 05/01/2019   LABURIC 4.9 08/08/2013    Lab Results  Component Value Date   MG 2.3 07/18/2016   MG 2.3 07/18/2016   MG 3.0 (H) 07/17/2016   No results found for: VD25OH  No results found for: PREALBUMIN CBC EXTENDED Latest Ref Rng & Units 07/16/2019 05/22/2019 05/16/2019  WBC 4.0 - 10.5 K/uL 4.6 7.1 7.3  RBC 3.87 - 5.11 MIL/uL 5.37(H) 4.27 3.76(L)  HGB 12.0 - 15.0 g/dL 95.6 10.7(L) 9.3(L)  HCT 36.0 - 46.0 % 44.6 35.0(L) 30.2(L)  PLT 150 - 400 K/uL 363 519(H) 357  NEUTROABS 1.7 - 7.7 K/uL - 3.5 -  LYMPHSABS 0.7 - 4.0 K/uL - 2.5 -     Body mass index is 16.3 kg/m.  Orders:  No orders of the defined types were placed in this encounter.  No orders of the defined types were placed in this encounter.    Procedures: No procedures performed  Clinical Data: No additional findings.  ROS:  All other systems negative, except as noted in the HPI. Review of Systems  Objective: Vital Signs: Ht 5'  6" (1.676 m)   Wt 101 lb (45.8 kg)   BMI 16.30 kg/m   Specialty Comments:  No specialty comments available.  PMFS History: Patient Active Problem List   Diagnosis Date Noted  . 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 Gangrene of left foot (HCC)   . Protein-calorie malnutrition, severe 05/15/2019  . Critical lower limb ischemia 05/09/2019  . Dyspnea 02/01/2019  . Weight loss, unintentional 02/01/2019  . Epigastric pain 02/01/2019  . 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 Acute pyelonephritis 10/20/2018  . Acute renal failure superimposed on stage 2 chronic kidney disease (HCC) 10/20/2018  . Diabetes (HCC) 04/01/2017  . Hyperthyroidism 02/21/2017  . Low mean corpuscular volume (MCV) 09/30/2016  . Coronary artery disease involving native heart without angina pectoris   . Hx of CABG 07/17/2016  . Paroxysmal atrial fibrillation (HCC) 07/12/2016    . NSTEMI (non-ST elevated myocardial infarction) (HCC) 07/11/2016  . History of arterial bypass of lower extremity 12/30/2015  . Claudication of right lower extremity (HCC) 12/30/2015  . Phantom limb pain-Left great toe 04/29/2015  . Seizure (HCC)   . Atrial flutter with rapid ventricular response vs. SVT 10/27/2014    Class: Acute  . Atherosclerosis of native artery of left lower extremity with rest pain (HCC) 12/18/2013  . PAD (peripheral artery disease) (HCC) 12/02/2013  . SLE (systemic lupus erythematosus) (HCC)   . CAD - S/P Takotsubo MI in 2000, CFX DES Sept 2014 05/25/2013  . Acute MI, inferior wall (HCC) 05/19/2013  . 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 Tachycardia-bradycardia (HCC) 03/09/2010  . PVD- s/p Lt [facility] April 2015 10/29/2009  . Dyslipidemia 08/26/2009  . Essential hypertension 08/26/2009  . CORONARY ATHEROSCLEROSIS NATIVE CORONARY ARTERY 08/26/2009  . 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 Rheumatoid arthritis (HCC) 08/26/2009   Past Medical History:  Diagnosis Date  . Abnormal LFTs    a. in the past when on Lipitor  . Anemia    as a young woman  . Anxiety   . Arnold-Chiari malformation, type I (HCC)    MRI brain 02/2010  . CAD (coronary artery disease)    a. NSTEMI 07/2009: LHC - D2 40%, LAD irreg., EF 50% with apical AK (tako-tsubo CM);  b. Inf STEMI (04/2013): Tx Promus DES to CFX;  c. 08/2013 Lexi CL: No ischemia, dist ant/ap/inf-ap infarct, EF  d. 06/2016 NSTEMI with LM disease: s/p CABG on 07/17/2016 w/ LIMA-LAD, SVG-OM, and SVG-dRCA.  [name] DEPRESSION   . Dizziness    ? CVA 01/2010 - carotid dopplers with no ICA stenosis  . GERD (gastroesophageal reflux disease)   . HYPERLIPIDEMIA   . HYPERTENSION   . MI (myocardial infarction) (HCC)   . PAD (peripheral artery disease) (HCC)    s/p L fem pop 11/2013 in setting of 1st toe osteo/gangrene  . Paroxysmal atrial fibrillation (HCC)    a. anticoagulated with Eliquis 10/2014  . Presence of permanent cardiac  pacemaker 10/2014   leadless permanent pacemaker  . RA (rheumatoid arthritis) (HCC)    prior tx by Dr. Dareen Piano  . Sjogren's syndrome (HCC) 06/02/13   pt denies this (12/01/13)  . SLE (systemic lupus erythematosus) (HCC) dx 05/2013   follows with rheum Dareen Piano)  . Tachy-brady syndrome (HCC)    a. s/p STJ Leadless pacemaker 11-04-2014 Dr Johney Frame  . 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  Arthritis Mother   . Emphysema Mother   . Arthritis Father   . COPD Father   . Heart disease Father   . Diabetes Paternal Aunt        paternal Aunts  . Heart disease Paternal Aunt   . Thyroid disease Neg Hx     Past Surgical History:  Procedure Laterality Date  . ABDOMINAL AORTAGRAM N/A 11/24/2013   Procedure: ABDOMINAL AORTAGRAM WITH BILATERAL RUNOFF WITH POSSIBLE INTERVENTION;  Surgeon: Angelia Mould, MD;  Location: [name] CATH LAB;  Service: Cardiovascular;  Laterality: N/A;  . ABDOMINAL AORTOGRAM W/LOWER EXTREMITY N/A 04/25/2019   Procedure: ABDOMINAL AORTOGRAM W/LOWER EXTREMITY;  Surgeon: Elam Dutch, MD;  Location: [place] CV LAB;  Service: Cardiovascular;  Laterality: N/A;  . ABDOMINAL AORTOGRAM W/LOWER EXTREMITY Bilateral 05/15/2019   Procedure: ABDOMINAL AORTOGRAM W/LOWER EXTREMITY;  Surgeon: Serafina Mitchell, MD;  Location: [place] CV LAB;  Service: Cardiovascular;  Laterality: Bilateral;  . AMPUTATION Left 12/02/2013   Procedure: LEFT 1ST TOE AMPUTATION;  Surgeon: Elam Dutch, MD;  Location: [place];  Service: Vascular;  Laterality: Left;  . AMPUTATION Left 07/16/2019   Procedure: LEFT TRANSMETATARSAL AMPUTATION;  Surgeon: Newt Minion, MD;  Location: [place];  Service: Orthopedics;  Laterality: Left;  . CARDIAC CATHETERIZATION N/A 07/12/2016   Procedure: Left Heart Cath and Coronary Angiography;  Surgeon: Troy Sine, MD;  Location: [place] CV LAB;   Service: Cardiovascular;  Laterality: N/A;  . CARDIAC CATHETERIZATION N/A 07/14/2016   Procedure: Intravascular Ultrasound/IVUS;  Surgeon: Nelva Bush, MD;  Location: [place] CV LAB;  Service: Cardiovascular;  Laterality: N/A;  . COLONOSCOPY    . CORONARY ANGIOPLASTY  04/2013  . CORONARY ARTERY BYPASS GRAFT N/A 07/17/2016   Procedure: CORONARY ARTERY BYPASS GRAFTING (CABG)x3 using right greater saphenous vein harvested endoscopiclly and left internal mammary artery;  Surgeon: Ivin Poot, MD;  Location: [place];  Service: Open Heart Surgery;  Laterality: N/A;  . ENDARTERECTOMY FEMORAL Left 05/12/2019   Procedure: LEFT Femoral Endarterectomy;  Surgeon: Elam Dutch, MD;  Location: [place];  Service: Vascular;  Laterality: Left;  . FEMORAL-POPLITEAL BYPASS GRAFT Left 12/02/2013   Procedure:  LEFT FEMORAL-BELOW KNEE POPLITEAL ARTERY BYPASS GRAFT;  Surgeon: Elam Dutch, MD;  Location: [place];  Service: Vascular;  Laterality: Left;  . LEFT HEART CATHETERIZATION WITH CORONARY ANGIOGRAM N/A 05/19/2013   Procedure: LEFT HEART CATHETERIZATION WITH CORONARY ANGIOGRAM;  Surgeon: Blane Ohara, MD;  Location: [facility] CATH LAB;  Service: Cardiovascular;  Laterality: N/A;  . LEFT HEART CATHETERIZATION WITH CORONARY ANGIOGRAM N/A 10/28/2014   Procedure: LEFT HEART CATHETERIZATION WITH CORONARY ANGIOGRAM;  Surgeon: Wellington Hampshire, MD;  Location: [place] CATH LAB;  Service: Cardiovascular;  Laterality: N/A;  . LOWER EXTREMITY ANGIOGRAM Left 05/12/2019   Procedure: Lower Extremity Angiogram;  Surgeon: Elam Dutch, MD;  Location: [place];  Service: Vascular;  Laterality: Left;  . PATCH ANGIOPLASTY Left 05/12/2019   Procedure: Left Femoral Patch Angioplasty USING CADAVERIC SAPHENOUS VEIN;  Surgeon: Elam Dutch, MD;  Location: [place];  Service: Vascular;  Laterality: Left;  . PERCUTANEOUS CORONARY STENT INTERVENTION (PCI-S)  05/19/2013   Procedure: PERCUTANEOUS CORONARY STENT INTERVENTION (PCI-S);   Surgeon: Blane Ohara, MD;  Location: [facility] CATH LAB;  Service: Cardiovascular;;  . PERMANENT PACEMAKER INSERTION N/A 11/04/2014   STJ Leadless pacemaker implanted by Dr Rayann Heman for tachy/brady  . TEE WITHOUT CARDIOVERSION N/A 07/17/2016   Procedure: TRANSESOPHAGEAL ECHOCARDIOGRAM (TEE);  Surgeon: Tharon Aquas Trigt,  MD;  Location: [facility] OR;  Service: Open Heart Surgery;  Laterality: N/A;  . THROMBECTOMY FEMORAL ARTERY Left 05/12/2019   Procedure: Left Ilio-Femoral Artery and Femoral-popliteal Graft Thrombectomy;  Surgeon: Sherren Kerns, MD;  Location: [facility] OR;  Service: Vascular;  Laterality: Left;  . TUBAL LIGATION    . VISCERAL ANGIOGRAPHY N/A 04/25/2019   Procedure: MESENTRIC  ANGIOGRAPHY;  Surgeon: Sherren Kerns, MD;  Location: [facility] INVASIVE CV LAB;  Service: Cardiovascular;  Laterality: N/A;   Social History   Occupational History  . Occupation: retired  Tobacco Use  . Smoking status: Former Smoker    Types: Cigarettes    Quit date: 05/19/2016    Years since quitting: 3.2  . Smokeless tobacco: Never Used  . Tobacco comment:    Substance and Sexual Activity  . Alcohol use: No    Alcohol/week: 0.0 standard drinks  . Drug use: Yes    Types: Marijuana  . Sexual activity: Not on file

## 2019-09-03 ENCOUNTER — Ambulatory Visit (INDEPENDENT_AMBULATORY_CARE_PROVIDER_SITE_OTHER): Payer: Medicare Other | Admitting: Physician Assistant

## 2019-09-03 ENCOUNTER — Encounter: Payer: Self-pay | Admitting: Physician Assistant

## 2019-09-03 ENCOUNTER — Other Ambulatory Visit: Payer: Self-pay

## 2019-09-03 VITALS — Ht 66.0 in | Wt 101.0 lb

## 2019-09-03 DIAGNOSIS — Z89432 Acquired absence of left foot: Secondary | ICD-10-CM

## 2019-09-03 MED ORDER — DULOXETINE HCL 30 MG PO CPEP: 30.0000 mg | ORAL_CAPSULE | Freq: Every day | ORAL | 0 refills | Status: DC

## 2019-09-03 NOTE — Progress Notes (Signed)
Office Visit Note   Patient: Mercedes [name]           Date of Birth: 01-Oct-1955           MRN: [id number] Visit Date: 09/03/2019              Requested by: Binnie Rail, MD [place],  [place] [address] PCP: Binnie Rail, MD  Chief Complaint  Patient presents with  . Left Foot - Routine Post Op    07/16/19 left transmet amputation       HPI: The patient is a 64 year old woman seen today status post left transmetatarsal amputation November 18.  She continues to have some issues with swelling she states she is unable to get her compression garment on due to pain.  She is complaining of quite a bit of sensitivity to the dorsum of her foot especially over the medial column.  She says that her daughter is planning to send her to a wound care expert.  She is worried about some drainage that she has been having at home.  Was prescribed some Neurontin in December for her issues with nerve pain she states she only took 2 doses was told by pharmacist this could interact with one of her other medications.  She is unable to relate anything more specific at this time.  Assessment & Plan: Visit Diagnoses:  1. History of transmetatarsal amputation of left foot ([place])     Plan: We will trial Cymbalta for her nerve pain.  Have advised she will need to take this for a few weeks we will reevaluate in the office in 2 weeks.  She will begin antibacterial ointment dressing changes over the 2 ulcerative areas to her left foot.  She may advance her weightbearing in regular shoewear if she can tolerate this recommended she resume the compression garments.  Follow-Up Instructions: Return in about 2 weeks (around 09/17/2019).   Ortho Exam  Patient is alert, oriented, no adenopathy, well-dressed, normal affect, normal respiratory effort. On examination of the left foot her transmetatarsal amputation is healing well over the lateral aspect she has one area that is not yet healed there is a  dime sized area that is covered with eschar.  Over the base of the fifth metatarsal she also unfortunately has an ulcer that is similar in size this has no depth is covered with eschar as well there is no active drainage no erythema she has very mild edema to her whole foot there is no sign of infection or cellulitis no warmth  Appears to have hypersensitivity to light touch on exam however she allows me to wrap her foot with an Ace wrap quite tight.  Imaging: No results found. No images are attached to the encounter.  Labs: Lab Results  Component Value Date   HGBA1C 6.4 11/05/2018   HGBA1C 6.5 03/29/2017   HGBA1C 6.2 09/29/2016   ESRSEDRATE 24 (H) 11/21/2013   ESRSEDRATE 2 09/09/2009   ESRSEDRATE 3 08/17/2009   CRP <0.5 (L) 11/21/2013   CRP 0.1 (L) 08/17/2009   LABURIC 4.9 08/08/2013   REPTSTATUS 10/25/2018 FINAL 10/20/2018   CULT NO GROWTH 5 DAYS 10/20/2018   LABORGA ESCHERICHIA COLI (A) 10/20/2018     Lab Results  Component Value Date   ALBUMIN 3.2 (L) 05/22/2019   ALBUMIN 3.5 05/09/2019   ALBUMIN 3.4 (L) 05/01/2019   LABURIC 4.9 08/08/2013    Lab Results  Component Value Date   MG 2.3 07/18/2016  MG 2.3 07/18/2016   MG 3.0 (H) 07/17/2016   No results found for: VD25OH  No results found for: PREALBUMIN CBC EXTENDED Latest Ref Rng & Units 07/16/2019 05/22/2019 05/16/2019  WBC 4.0 - 10.5 K/uL 4.6 7.1 7.3  RBC 3.87 - 5.11 MIL/uL 5.37(H) 4.27 3.76(L)  HGB 12.0 - 15.0 g/dL 58.5 10.7(L) 9.3(L)  HCT 36.0 - 46.0 % 44.6 35.0(L) 30.2(L)  PLT 150 - 400 K/uL 363 519(H) 357  NEUTROABS 1.7 - 7.7 K/uL - 3.5 -  LYMPHSABS 0.7 - 4.0 K/uL - 2.5 -     Body mass index is 16.3 kg/m.  Orders:  No orders of the defined types were placed in this encounter.  No orders of the defined types were placed in this encounter.    Procedures: No procedures performed  Clinical Data: No additional findings.  ROS:  All other systems negative, except as noted in the HPI. Review of  Systems  Objective: Vital Signs: Ht 5\' 6"  (1.676 m)   Wt 101 lb (45.8 kg)   BMI 16.30 kg/m   Specialty Comments:  No specialty comments available.  PMFS History: Patient Active Problem List   Diagnosis Date Noted  . 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 Gangrene of left foot (HCC)   . Protein-calorie malnutrition, severe 05/15/2019  . Critical lower limb ischemia 05/09/2019  . Dyspnea 02/01/2019  . Weight loss, unintentional 02/01/2019  . Epigastric pain 02/01/2019  . 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 Acute pyelonephritis 10/20/2018  . Acute renal failure superimposed on stage 2 chronic kidney disease (HCC) 10/20/2018  . Diabetes (HCC) 04/01/2017  . Hyperthyroidism 02/21/2017  . Low mean corpuscular volume (MCV) 09/30/2016  . Coronary artery disease involving native heart without angina pectoris   . Hx of CABG 07/17/2016  . Paroxysmal atrial fibrillation (HCC) 07/12/2016  . NSTEMI (non-ST elevated myocardial infarction) (HCC) 07/11/2016  . History of arterial bypass of lower extremity 12/30/2015  . Claudication of right lower extremity (HCC) 12/30/2015  . Phantom limb pain-Left great toe 04/29/2015  . Seizure (HCC)   . Atrial flutter with rapid ventricular response vs. SVT 10/27/2014    Class: Acute  . Atherosclerosis of native artery of left lower extremity with rest pain (HCC) 12/18/2013  . PAD (peripheral artery disease) (HCC) 12/02/2013  . SLE (systemic lupus erythematosus) (HCC)   . CAD - S/P Takotsubo MI in 2000, CFX DES Sept 2014 05/25/2013  . Acute MI, inferior wall (HCC) 05/19/2013  . 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 Tachycardia-bradycardia (HCC) 03/09/2010  . PVD- s/p Lt [facility] April 2015 10/29/2009  . Dyslipidemia 08/26/2009  . Essential hypertension 08/26/2009  . CORONARY ATHEROSCLEROSIS NATIVE CORONARY ARTERY 08/26/2009  . 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 Rheumatoid arthritis (HCC) 08/26/2009   Past  Medical History:  Diagnosis Date  . Abnormal LFTs    a. in the past when on Lipitor  . Anemia    as a young woman  . Anxiety   . Arnold-Chiari malformation, type I (HCC)    MRI brain 02/2010  . CAD (coronary artery disease)    a. NSTEMI 07/2009: LHC - D2 40%, LAD irreg., EF 50% with apical AK (tako-tsubo CM);  b. Inf STEMI (04/2013): Tx Promus DES to CFX;  c. 08/2013 Lexi CL: No ischemia, dist ant/ap/inf-ap infarct, EF  d. 06/2016 NSTEMI with LM disease: s/p CABG on 07/17/2016 w/ LIMA-LAD, SVG-OM, and SVG-dRCA.  07/19/2016 DEPRESSION   . Dizziness    ? CVA 01/2010 -  carotid dopplers with no ICA stenosis  . GERD (gastroesophageal reflux disease)   . HYPERLIPIDEMIA   . HYPERTENSION   . MI (myocardial infarction) (HCC)   . PAD (peripheral artery disease) (HCC)    s/p L fem pop 11/2013 in setting of 1st toe osteo/gangrene  . Paroxysmal atrial fibrillation (HCC)    a. anticoagulated with Eliquis 10/2014  . 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 RA (rheumatoid arthritis) (HCC)    prior tx by Dr. Dareen Piano  . Sjogren's syndrome (HCC) 06/02/13   pt denies this (12/01/13)  . SLE (systemic lupus erythematosus) (HCC) dx 05/2013   follows with rheum Dareen Piano)  . Tachy-brady syndrome (HCC)    a. s/p STJ Leadless pacemaker 11-04-2014 Dr Johney Frame  . 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 Arthritis Mother   . Emphysema Mother   . Arthritis Father   . COPD Father   . Heart disease Father   . Diabetes Paternal Aunt        paternal Aunts  . Heart disease Paternal Aunt   . Thyroid disease Neg Hx     Past Surgical History:  Procedure Laterality Date  . ABDOMINAL AORTAGRAM N/A 11/24/2013   Procedure: ABDOMINAL AORTAGRAM WITH BILATERAL RUNOFF WITH POSSIBLE INTERVENTION;  Surgeon: Chuck Hint, MD;  Location[facility] CATH LAB;  Service:  Cardiovascular;  Laterality: N/A;  . ABDOMINAL AORTOGRAM W/LOWER EXTREMITY N/A 04/25/2019   Procedure: ABDOMINAL AORTOGRAM W/LOWER EXTREMITY;  Surgeon: Sherren Kerns, MD;  Location: [facility] INVASIVE CV LAB;  Service: Cardiovascular;  Laterality: N/A;  . ABDOMINAL AORTOGRAM W/LOWER EXTREMITY Bilateral 05/15/2019   Procedure: ABDOMINAL AORTOGRAM W/LOWER EXTREMITY;  Surgeon: Nada Libman, MD;  Location: [facility] INVASIVE CV LAB;  Service: Cardiovascular;  Laterality: Bilateral;  . AMPUTATION Left 12/02/2013   Procedure: LEFT 1ST TOE AMPUTATION;  Surgeon: Sherren Kerns, MD;  Location: [facility] OR;  Service: Vascular;  Laterality: Left;  . AMPUTATION Left 07/16/2019   Procedure: LEFT TRANSMETATARSAL AMPUTATION;  Surgeon: Nadara Mustard, MD;  Location: [facility] OR;  Service: Orthopedics;  Laterality: Left;  . CARDIAC CATHETERIZATION N/A 07/12/2016   Procedure: Left Heart Cath and Coronary Angiography;  Surgeon: Lennette Bihari, MD;  Location: [facility] INVASIVE CV LAB;  Service: Cardiovascular;  Laterality: N/A;  . CARDIAC CATHETERIZATION N/A 07/14/2016   Procedure: Intravascular Ultrasound/IVUS;  Surgeon: Yvonne Kendall, MD;  Location: [facility] INVASIVE CV LAB;  Service: Cardiovascular;  Laterality: N/A;  . COLONOSCOPY    . CORONARY ANGIOPLASTY  04/2013  . CORONARY ARTERY BYPASS GRAFT N/A 07/17/2016   Procedure: CORONARY ARTERY BYPASS GRAFTING (CABG)x3 using right greater saphenous vein harvested endoscopiclly and left internal mammary artery;  Surgeon: Kerin Perna, MD;  Location: [facility] OR;  Service: Open Heart Surgery;  Laterality: N/A;  . ENDARTERECTOMY FEMORAL Left 05/12/2019   Procedure: LEFT Femoral Endarterectomy;  Surgeon: Sherren Kerns, MD;  Location: [facility] OR;  Service: Vascular;  Laterality: Left;  . FEMORAL-POPLITEAL BYPASS GRAFT Left 12/02/2013   Procedure:  LEFT FEMORAL-BELOW KNEE POPLITEAL ARTERY BYPASS GRAFT;  Surgeon: Sherren Kerns, MD;  Location: [facility] OR;  Service: Vascular;  Laterality: Left;  . LEFT HEART  CATHETERIZATION WITH CORONARY ANGIOGRAM N/A 05/19/2013   Procedure: LEFT HEART CATHETERIZATION WITH CORONARY ANGIOGRAM;  Surgeon: Micheline Chapman, MD;  Location: [facility] CATH LAB;  Service: Cardiovascular;  Laterality:  N/A;  . LEFT HEART CATHETERIZATION WITH CORONARY ANGIOGRAM N/A 10/28/2014   Procedure: LEFT HEART CATHETERIZATION WITH CORONARY ANGIOGRAM;  Surgeon: Iran Ouch, MD;  Location: [facility] CATH LAB;  Service: Cardiovascular;  Laterality: N/A;  . LOWER EXTREMITY ANGIOGRAM Left 05/12/2019   Procedure: Lower Extremity Angiogram;  Surgeon: Sherren Kerns, MD;  Location: [facility] OR;  Service: Vascular;  Laterality: Left;  . PATCH ANGIOPLASTY Left 05/12/2019   Procedure: Left Femoral Patch Angioplasty USING CADAVERIC SAPHENOUS VEIN;  Surgeon: Sherren Kerns, MD;  Location: [facility] OR;  Service: Vascular;  Laterality: Left;  . PERCUTANEOUS CORONARY STENT INTERVENTION (PCI-S)  05/19/2013   Procedure: PERCUTANEOUS CORONARY STENT INTERVENTION (PCI-S);  Surgeon: Micheline Chapman, MD;  Location: [facility] CATH LAB;  Service: Cardiovascular;;  . PERMANENT PACEMAKER INSERTION N/A 11/04/2014   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  . TEE WITHOUT CARDIOVERSION N/A 07/17/2016   Procedure: TRANSESOPHAGEAL ECHOCARDIOGRAM (TEE);  Surgeon: Kerin Perna, MD;  Location: [facility] OR;  Service: Open Heart Surgery;  Laterality: N/A;  . THROMBECTOMY FEMORAL ARTERY Left 05/12/2019   Procedure: Left Ilio-Femoral Artery and Femoral-popliteal Graft Thrombectomy;  Surgeon: Sherren Kerns, MD;  Location: [facility] OR;  Service: Vascular;  Laterality: Left;  . TUBAL LIGATION    . VISCERAL ANGIOGRAPHY N/A 04/25/2019   Procedure: MESENTRIC  ANGIOGRAPHY;  Surgeon: Sherren Kerns, MD;  Location: [facility] INVASIVE CV LAB;  Service: Cardiovascular;  Laterality: N/A;   Social History   Occupational History  . Occupation: retired  Tobacco Use  . Smoking status: Former Smoker    Types: Cigarettes    Quit date: 05/19/2016    Years  since quitting: 3.2  . Smokeless tobacco: Never Used  . Tobacco comment:    Substance and Sexual Activity  . Alcohol use: No    Alcohol/week: 0.0 standard drinks  . Drug use: Yes    Types: Marijuana  . Sexual activity: Not on file

## 2019-09-04 ENCOUNTER — Encounter: Payer: Medicare Other | Admitting: Physical Therapy

## 2019-09-16 NOTE — Progress Notes (Signed)
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  Admitted 11/17 withNSTEMI (troponin peak of 4.10). Initial cath showed a widely patent LCx stent but there was evidence of focal thrombus at the distal LM and ostial LAD. She was continued on Aggrastat and a relook cath was performed on 11/17 which showed 40-50% distal LMCA haziness and 70-80% ostial LAD stenosis. Echocardiogram November 2017 showed ejection fraction 55-60%, grade 2 diastolic dysfunction, mild left atrial enlargement, mild mitral regurgitation and moderate tricuspid regurgitation. Had CABG on 07/17/2016 with LIMA-LAD, SVG-OM, and SVG-dRCA). Also with h/o PAF.  Carotid Dopplers November 2018 showed 1 to 39% right and 40 to 59% left stenosis.  Patient had thrombectomy left femoral-popliteal bypass patch angioplasty proximal left femoral popliteal and common femoral artery and thrombectomy left external iliac artery, profunda, superficial femoral artery in September 2020.  Patient had left transmetatarsal amputation for digit ischemia November 2020.  Since last seen she denies dyspnea, chest pain or syncope.   Current Outpatient Medications  Medication Sig Dispense Refill  . DULoxetine (CYMBALTA) 30 MG capsule Take 1 capsule (30 mg total) by mouth daily. Qam 30 capsule 0  . ELIQUIS 5 MG TABS tablet TAKE 1 TABLET BY MOUTH TWICE DAILY (Patient taking differently: Take 5 mg by mouth 2 (two) times daily. ) 180 tablet 3  . lisinopril (ZESTRIL) 10 MG tablet Take 1 tablet (10 mg total) by mouth daily. 90 tablet 2  . metoprolol tartrate (LOPRESSOR) 25 MG tablet Take 0.5 tablets (12.5 mg total) by mouth 2 (two) times daily. 180 tablet 3  . Multiple Vitamin (MULTIVITAMIN WITH MINERALS) TABS tablet Take 1 tablet by mouth daily.    . nitroGLYCERIN (NITROSTAT) 0.4 MG SL tablet Place 1 tablet (0.4 mg total) under the tongue every 5 (five) minutes as  needed for chest pain. 25 tablet 3  . rosuvastatin (CRESTOR) 40 MG tablet Take 1 tablet (40 mg total) by mouth daily. 90 tablet 3   No current facility-administered medications for this visit.     Past Medical History:  Diagnosis Date  . Abnormal LFTs    a. in the past when on Lipitor  . Anemia    as a young woman  . Anxiety   . Arnold-Chiari malformation, type I (HCC)    MRI brain 02/2010  . CAD (coronary artery disease)    a. NSTEMI 07/2009: LHC - D2 40%, LAD irreg., EF 50% with apical AK (tako-tsubo CM);  b. Inf STEMI (04/2013): Tx Promus DES to CFX;  c. 08/2013 Lexi CL: No ischemia, dist ant/ap/inf-ap infarct, EF  d. 06/2016 NSTEMI with LM disease: s/p CABG on 07/17/2016 w/ LIMA-LAD, SVG-OM, and SVG-dRCA.  [name] DEPRESSION   . Dizziness    ? CVA 01/2010 - carotid dopplers with no ICA stenosis  . GERD (gastroesophageal reflux disease)   . HYPERLIPIDEMIA   . HYPERTENSION   . MI (myocardial infarction) (HCC)   . PAD (peripheral artery disease) (HCC)    s/p L fem pop 11/2013 in setting of 1st toe osteo/gangrene  . Paroxysmal atrial fibrillation (HCC)    a. anticoagulated with Eliquis 10/2014  . 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 RA (rheumatoid arthritis) (HCC)    prior tx by Dr. Dareen Piano  . Sjogren's syndrome (HCC) 06/02/13   pt denies this (12/01/13)  . SLE (systemic lupus erythematosus) (HCC) dx 05/2013   follows  with rheum Dareen Piano)  . Tachy-brady syndrome (HCC)    a. s/p STJ Leadless pacemaker 11-04-2014 Dr Johney Frame  . 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 ABDOMINAL AORTAGRAM N/A 11/24/2013   Procedure: ABDOMINAL AORTAGRAM WITH BILATERAL RUNOFF WITH POSSIBLE INTERVENTION;  Surgeon: Chuck Hint, MD;  Location: [facility] CATH LAB;  Service: Cardiovascular;  Laterality: N/A;  . ABDOMINAL AORTOGRAM W/LOWER EXTREMITY  N/A 04/25/2019   Procedure: ABDOMINAL AORTOGRAM W/LOWER EXTREMITY;  Surgeon: Sherren Kerns, MD;  Location: [facility] INVASIVE CV LAB;  Service: Cardiovascular;  Laterality: N/A;  . ABDOMINAL AORTOGRAM W/LOWER EXTREMITY Bilateral 05/15/2019   Procedure: ABDOMINAL AORTOGRAM W/LOWER EXTREMITY;  Surgeon: Nada Libman, MD;  Location: [facility] INVASIVE CV LAB;  Service: Cardiovascular;  Laterality: Bilateral;  . AMPUTATION Left 12/02/2013   Procedure: LEFT 1ST TOE AMPUTATION;  Surgeon: Sherren Kerns, MD;  Location: [facility] OR;  Service: Vascular;  Laterality: Left;  . AMPUTATION Left 07/16/2019   Procedure: LEFT TRANSMETATARSAL AMPUTATION;  Surgeon: Nadara Mustard, MD;  Location: [facility] OR;  Service: Orthopedics;  Laterality: Left;  . CARDIAC CATHETERIZATION N/A 07/12/2016   Procedure: Left Heart Cath and Coronary Angiography;  Surgeon: Lennette Bihari, MD;  Location: [facility] INVASIVE CV LAB;  Service: Cardiovascular;  Laterality: N/A;  . CARDIAC CATHETERIZATION N/A 07/14/2016   Procedure: Intravascular Ultrasound/IVUS;  Surgeon: Yvonne Kendall, MD;  Location: [facility] INVASIVE CV LAB;  Service: Cardiovascular;  Laterality: N/A;  . COLONOSCOPY    . CORONARY ANGIOPLASTY  04/2013  . CORONARY ARTERY BYPASS GRAFT N/A 07/17/2016   Procedure: CORONARY ARTERY BYPASS GRAFTING (CABG)x3 using right greater saphenous vein harvested endoscopiclly and left internal mammary artery;  Surgeon: Kerin Perna, MD;  Location: [facility] OR;  Service: Open Heart Surgery;  Laterality: N/A;  . ENDARTERECTOMY FEMORAL Left 05/12/2019   Procedure: LEFT Femoral Endarterectomy;  Surgeon: Sherren Kerns, MD;  Location: [facility] OR;  Service: Vascular;  Laterality: Left;  . FEMORAL-POPLITEAL BYPASS GRAFT Left 12/02/2013   Procedure:  LEFT FEMORAL-BELOW KNEE POPLITEAL ARTERY BYPASS GRAFT;  Surgeon: Sherren Kerns, MD;  Location: [facility] OR;  Service: Vascular;  Laterality: Left;  . LEFT HEART CATHETERIZATION WITH CORONARY ANGIOGRAM N/A 05/19/2013   Procedure: LEFT HEART  CATHETERIZATION WITH CORONARY ANGIOGRAM;  Surgeon: Micheline Chapman, MD;  Location: [facility] CATH LAB;  Service: Cardiovascular;  Laterality: N/A;  . LEFT HEART CATHETERIZATION WITH CORONARY ANGIOGRAM N/A 10/28/2014   Procedure: LEFT HEART CATHETERIZATION WITH CORONARY ANGIOGRAM;  Surgeon: Iran Ouch, MD;  Location: [facility] CATH LAB;  Service: Cardiovascular;  Laterality: N/A;  . LOWER EXTREMITY ANGIOGRAM Left 05/12/2019   Procedure: Lower Extremity Angiogram;  Surgeon: Sherren Kerns, MD;  Location: [facility] OR;  Service: Vascular;  Laterality: Left;  . PATCH ANGIOPLASTY Left 05/12/2019   Procedure: Left Femoral Patch Angioplasty USING CADAVERIC SAPHENOUS VEIN;  Surgeon: Sherren Kerns, MD;  Location: [facility] OR;  Service: Vascular;  Laterality: Left;  . PERCUTANEOUS CORONARY STENT INTERVENTION (PCI-S)  05/19/2013   Procedure: PERCUTANEOUS CORONARY STENT INTERVENTION (PCI-S);  Surgeon: Micheline Chapman, MD;  Location: [facility] CATH LAB;  Service: Cardiovascular;;  . PERMANENT PACEMAKER INSERTION N/A 11/04/2014   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  . TEE WITHOUT CARDIOVERSION N/A 07/17/2016   Procedure: TRANSESOPHAGEAL ECHOCARDIOGRAM (TEE);  Surgeon: Kerin Perna, MD;  Location: [facility] OR;  Service: Open Heart Surgery;  Laterality: N/A;  .  THROMBECTOMY FEMORAL ARTERY Left 05/12/2019   Procedure: Left Ilio-Femoral Artery and Femoral-popliteal Graft Thrombectomy;  Surgeon: Sherren Kerns, MD;  Location: [facility] OR;  Service: Vascular;  Laterality: Left;  . TUBAL LIGATION    . VISCERAL ANGIOGRAPHY N/A 04/25/2019   Procedure: MESENTRIC  ANGIOGRAPHY;  Surgeon: Sherren Kerns, MD;  Location: [facility] INVASIVE CV LAB;  Service: Cardiovascular;  Laterality: N/A;    Social History   Socioeconomic History  . Marital status: Single    Spouse name: Not on file  . Number of children: 3  . Years of education: Not on file  . Highest education level: Not on file  Occupational History  . Occupation: retired    Tobacco Use  . Smoking status: Former Smoker    Types: Cigarettes    Quit date: 05/19/2016    Years since quitting: 3.3  . Smokeless tobacco: Never Used  . Tobacco comment:    Substance and Sexual Activity  . Alcohol use: No    Alcohol/week: 0.0 standard drinks  . Drug use: Yes    Types: Marijuana  . Sexual activity: Not on file  Other Topics Concern  . Not on file  Social History Narrative   Lives alone.   Social Determinants of Health   Financial Resource Strain:   . Difficulty of Paying Living Expenses: Not on file  Food Insecurity:   . Worried About Programme researcher, broadcasting/film/video in the Last Year: Not on file  . Ran Out of Food in the Last Year: Not on file  Transportation Needs:   . Lack of Transportation (Medical): Not on file  . Lack of Transportation (Non-Medical): Not on file  Physical Activity:   . Days of Exercise per Week: Not on file  . Minutes of Exercise per Session: Not on file  Stress:   . Feeling of Stress : Not on file  Social Connections:   . Frequency of Communication with Friends and Family: Not on file  . Frequency of Social Gatherings with Friends and Family: Not on file  . Attends Religious Services: Not on file  . Active Member of Clubs or Organizations: Not on file  . Attends Banker Meetings: Not on file  . Marital Status: Not on file  Intimate Partner Violence:   . Fear of Current or Ex-Partner: Not on file  . Emotionally Abused: Not on file  . Physically Abused: Not on file  . Sexually Abused: Not on file    Family History  Problem Relation Age of Onset  . Arthritis Mother   . Emphysema Mother   . Arthritis Father   . COPD Father   . Heart disease Father   . Diabetes Paternal Aunt        paternal Aunts  . Heart disease Paternal Aunt   . Thyroid disease Neg Hx     ROS: Residual pain in foot from previous amputation but no fevers or chills, productive cough, hemoptysis, dysphasia, odynophagia, melena, hematochezia, dysuria,  hematuria, rash, seizure activity, orthopnea, PND, pedal edema, claudication. Remaining systems are negative.  Physical Exam: Well-developed well-nourished in no acute distress.  Skin is warm and dry.  HEENT is normal.  Neck is supple.  Chest is clear to auscultation with normal expansion.  Cardiovascular exam is regular rate and rhythm.  Abdominal exam nontender or distended. No masses palpated. Extremities show no edema. neuro grossly intact  A/P  1 coronary artery disease status post coronary artery bypass graft-patient is not having chest pain.  Continue medical therapy; resume statin.  Discontinue aspirin given need for apixaban.  2 carotid artery disease-will plan follow-up carotid Dopplers.  3 peripheral vascular disease-followed by vascular surgery.  Resume statin.  4 paroxysmal atrial fibrillation-patient remains in sinus rhythm.  Continue beta-blocker and apixaban.  5 hypertension-blood pressure elevated; however patient states typically controlled.  Continue present medications and follow.  Advance as needed.  6 hyperlipidemia-add Crestor 40 mg daily given documented vascular disease.  Check lipids and liver in 12 weeks.  7 prior pacemaker-followed by electrophysiology.  8 tobacco abuse-patient counseled on discontinuing.  Kirk Ruths, MD

## 2019-09-17 ENCOUNTER — Ambulatory Visit: Payer: Medicare Other | Admitting: Physician Assistant

## 2019-09-18 ENCOUNTER — Encounter: Payer: Self-pay | Admitting: Cardiology

## 2019-09-18 ENCOUNTER — Other Ambulatory Visit: Payer: Self-pay

## 2019-09-18 ENCOUNTER — Ambulatory Visit (INDEPENDENT_AMBULATORY_CARE_PROVIDER_SITE_OTHER): Payer: Medicare Other | Admitting: Cardiology

## 2019-09-18 VITALS — BP 152/86 | HR 70 | Ht 66.0 in | Wt 110.6 lb

## 2019-09-18 DIAGNOSIS — I480 Paroxysmal atrial fibrillation: Secondary | ICD-10-CM

## 2019-09-18 DIAGNOSIS — I2510 Atherosclerotic heart disease of native coronary artery without angina pectoris: Secondary | ICD-10-CM

## 2019-09-18 DIAGNOSIS — I739 Peripheral vascular disease, unspecified: Secondary | ICD-10-CM

## 2019-09-18 DIAGNOSIS — I10 Essential (primary) hypertension: Secondary | ICD-10-CM

## 2019-09-18 DIAGNOSIS — E7800 Pure hypercholesterolemia, unspecified: Secondary | ICD-10-CM

## 2019-09-18 MED ORDER — ROSUVASTATIN CALCIUM 40 MG PO TABS: 40.0000 mg | ORAL_TABLET | Freq: Every day | ORAL | 3 refills | Status: DC

## 2019-09-18 NOTE — Patient Instructions (Signed)
Medication Instructions:  STOP TAKING ASPIRIN 81MG  DAILY BEGIN TAKING CRESTOR 40MG  DAILY= 1 TABLET DAILY *If you need a refill on your cardiac medications before your next appointment, please call your pharmacy*  Lab Work: IN 12 WEEKS LIPID AND LIVER- PLEASE DO NOT EAT OR DRINK BEFORE THESE LABS  If you have labs (blood work) drawn today and your tests are completely normal, you will receive your results only by: MyChart Message (if you have MyChart) OR . A paper copy in the mail If you have any lab test that is abnormal or we need to change your treatment, we will call you to review the results.  Testing/Procedures: Your physician has requested that you have a carotid duplex. This test is an ultrasound of the carotid arteries in your neck. It looks at blood flow through these arteries that supply the brain with blood. Allow one hour for this exam. There are no restrictions or special instructions.  DONE HERE IN OFFICE  Follow-Up: At [facility], you and your health needs are our priority.  As part of our continuing mission to provide you with exceptional heart care, we have created designated Provider Care Teams.  These Care Teams include your primary Cardiologist (physician) and Advanced Practice Providers (APPs -  Physician Assistants and Nurse Practitioners) who all work together to provide you with the care you need, when you need it.  Your next appointment:   6 month(s)  The format for your next appointment:   In Person  Provider:   [name], MD

## 2019-09-19 ENCOUNTER — Encounter: Payer: Self-pay | Admitting: Internal Medicine

## 2019-09-22 ENCOUNTER — Ambulatory Visit: Payer: Self-pay

## 2019-09-22 ENCOUNTER — Ambulatory Visit (INDEPENDENT_AMBULATORY_CARE_PROVIDER_SITE_OTHER): Payer: Medicare Other | Admitting: Orthopedic Surgery

## 2019-09-22 ENCOUNTER — Encounter: Payer: Self-pay | Admitting: Orthopedic Surgery

## 2019-09-22 ENCOUNTER — Other Ambulatory Visit: Payer: Self-pay

## 2019-09-22 VITALS — Ht 66.0 in | Wt 110.6 lb

## 2019-09-22 DIAGNOSIS — Z89432 Acquired absence of left foot: Secondary | ICD-10-CM | POA: Diagnosis not present

## 2019-09-22 DIAGNOSIS — I739 Peripheral vascular disease, unspecified: Secondary | ICD-10-CM

## 2019-09-22 MED ORDER — OXYCODONE-ACETAMINOPHEN 5-325 MG PO TABS: 1.0000 | ORAL_TABLET | ORAL | 0 refills | Status: DC | PRN

## 2019-09-22 NOTE — Progress Notes (Signed)
Office Visit Note   Patient: Mercedes [name]           Date of Birth: 1956-03-05           MRN: [id number] Visit Date: 09/22/2019              Requested by: Pincus Sanes, MD [address] [place],  [place] [address] PCP: Pincus Sanes, MD  Chief Complaint  Patient presents with  . Left Foot - Routine Post Op    07/16/19 left transmet amputation       HPI: Patient is a 64 year old woman status post revascularization to the left lower extremity who is also status post a transmetatarsal amputation.  The patient presents at this time with acute pain and a cold ischemic foot.  Patient states that her medicine is not working.  Assessment & Plan: Visit Diagnoses:  1. History of transmetatarsal amputation of left foot (HCC)   2. PAD (peripheral artery disease) (HCC)     Plan: I had a long discussion with the patient that she has an acute ischemic left foot.  She does have an appoint with Dr. Darrick Penna on Thursday and recommended either going to the emergency room at [facility] right now or we will try to get her into vascular vein surgery tomorrow.  Recommended that patient prioritize this and not her primary care visit tomorrow.  Follow-Up Instructions: Return if symptoms worsen or fail to improve.   Ortho Exam  Patient is alert, oriented, no adenopathy, well-dressed, normal affect, normal respiratory effort. Examination patient has a transmetatarsal amputation that has healed well.  Patient has acute painful ischemic left foot that is cold and purple.  A Doppler was used and I cannot Doppler a dorsalis pedis or posterior tibial pulse.  Patient did have a dopplerable pulse 4 weeks ago..  Imaging: XR Foot Complete Left  Result Date: 09/22/2019 Three-view radiographs of the left foot shows disuse osteopenia.  No radiographic evidence of osteomyelitis.  No images are attached to the encounter.  Labs: Lab Results  Component Value Date   HGBA1C 6.4 11/05/2018   HGBA1C 6.5  03/29/2017   HGBA1C 6.2 09/29/2016   ESRSEDRATE 24 (H) 11/21/2013   ESRSEDRATE 2 09/09/2009   ESRSEDRATE 3 08/17/2009   CRP <0.5 (L) 11/21/2013   CRP 0.1 (L) 08/17/2009   LABURIC 4.9 08/08/2013   REPTSTATUS 10/25/2018 FINAL 10/20/2018   CULT NO GROWTH 5 DAYS 10/20/2018   LABORGA ESCHERICHIA COLI (A) 10/20/2018     Lab Results  Component Value Date   ALBUMIN 3.2 (L) 05/22/2019   ALBUMIN 3.5 05/09/2019   ALBUMIN 3.4 (L) 05/01/2019   LABURIC 4.9 08/08/2013    Lab Results  Component Value Date   MG 2.3 07/18/2016   MG 2.3 07/18/2016   MG 3.0 (H) 07/17/2016   No results found for: VD25OH  No results found for: PREALBUMIN CBC EXTENDED Latest Ref Rng & Units 07/16/2019 05/22/2019 05/16/2019  WBC 4.0 - 10.5 K/uL 4.6 7.1 7.3  RBC 3.87 - 5.11 MIL/uL 5.37(H) 4.27 3.76(L)  HGB 12.0 - 15.0 g/dL 99.2 10.7(L) 9.3(L)  HCT 36.0 - 46.0 % 44.6 35.0(L) 30.2(L)  PLT 150 - 400 K/uL 363 519(H) 357  NEUTROABS 1.7 - 7.7 K/uL - 3.5 -  LYMPHSABS 0.7 - 4.0 K/uL - 2.5 -     Body mass index is 17.85 kg/m.  Orders:  Orders Placed This Encounter  Procedures  . XR Foot Complete Left  . Ambulatory referral to Vascular  Surgery   Meds ordered this encounter  Medications  . oxyCODONE-acetaminophen (PERCOCET/ROXICET) 5-325 MG tablet    Sig: Take 1 tablet by mouth every 4 (four) hours as needed for severe pain.    Dispense:  30 tablet    Refill:  0     Procedures: No procedures performed  Clinical Data: No additional findings.  ROS:  All other systems negative, except as noted in the HPI. Review of Systems  Objective: Vital Signs: Ht 5\' 6"  (1.676 m)   Wt 110 lb 9.6 oz (50.2 kg)   BMI 17.85 kg/m   Specialty Comments:  No specialty comments available.  PMFS History: Patient Active Problem List   Diagnosis Date Noted  . 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 Gangrene of left foot (HCC)   . Protein-calorie malnutrition, severe 05/15/2019  .  Critical lower limb ischemia 05/09/2019  . Dyspnea 02/01/2019  . Weight loss, unintentional 02/01/2019  . Epigastric pain 02/01/2019  . 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 Acute pyelonephritis 10/20/2018  . Acute renal failure superimposed on stage 2 chronic kidney disease (HCC) 10/20/2018  . Diabetes (HCC) 04/01/2017  . Hyperthyroidism 02/21/2017  . Low mean corpuscular volume (MCV) 09/30/2016  . Coronary artery disease involving native heart without angina pectoris   . Hx of CABG 07/17/2016  . Paroxysmal atrial fibrillation (HCC) 07/12/2016  . NSTEMI (non-ST elevated myocardial infarction) (HCC) 07/11/2016  . History of arterial bypass of lower extremity 12/30/2015  . Claudication of right lower extremity (HCC) 12/30/2015  . Phantom limb pain-Left great toe 04/29/2015  . Seizure (HCC)   . Atrial flutter with rapid ventricular response vs. SVT 10/27/2014    Class: Acute  . Atherosclerosis of native artery of left lower extremity with rest pain (HCC) 12/18/2013  . PAD (peripheral artery disease) (HCC) 12/02/2013  . SLE (systemic lupus erythematosus) (HCC)   . CAD - S/P Takotsubo MI in 2000, CFX DES Sept 2014 05/25/2013  . Acute MI, inferior wall (HCC) 05/19/2013  . 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 Tachycardia-bradycardia (HCC) 03/09/2010  . PVD- s/p Lt [facility] April 2015 10/29/2009  . Dyslipidemia 08/26/2009  . Essential hypertension 08/26/2009  . CORONARY ATHEROSCLEROSIS NATIVE CORONARY ARTERY 08/26/2009  . 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 Rheumatoid arthritis (HCC) 08/26/2009   Past Medical History:  Diagnosis Date  . Abnormal LFTs    a. in the past when on Lipitor  . Anemia    as a young woman  . Anxiety   . Arnold-Chiari malformation, type I (HCC)    MRI brain 02/2010  . CAD (coronary artery disease)    a. NSTEMI 07/2009: LHC - D2 40%, LAD irreg., EF 50% with apical AK (tako-tsubo CM);  b. Inf STEMI (04/2013): Tx Promus DES to CFX;  c. 08/2013 Lexi CL: No  ischemia, dist ant/ap/inf-ap infarct, EF  d. 06/2016 NSTEMI with LM disease: s/p CABG on 07/17/2016 w/ LIMA-LAD, SVG-OM, and SVG-dRCA.  07/19/2016 DEPRESSION   . Dizziness    ? CVA 01/2010 - carotid dopplers with no ICA stenosis  . GERD (gastroesophageal reflux disease)   . HYPERLIPIDEMIA   . HYPERTENSION   . MI (myocardial infarction) (HCC)   . PAD (peripheral artery disease) (HCC)    s/p L fem pop 11/2013 in setting of 1st toe osteo/gangrene  . Paroxysmal atrial fibrillation (HCC)    a. anticoagulated with Eliquis 10/2014  . 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 RA (rheumatoid  arthritis) (HCC)    prior tx by Dr. Dareen Piano  . Sjogren's syndrome (HCC) 06/02/13   pt denies this (12/01/13)  . SLE (systemic lupus erythematosus) (HCC) dx 05/2013   follows with rheum Dareen Piano)  . Tachy-brady syndrome (HCC)    a. s/p STJ Leadless pacemaker 11-04-2014 Dr Johney Frame  . 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 Arthritis Mother   . Emphysema Mother   . Arthritis Father   . COPD Father   . Heart disease Father   . Diabetes Paternal Aunt        paternal Aunts  . Heart disease Paternal Aunt   . Thyroid disease Neg Hx     Past Surgical History:  Procedure Laterality Date  . ABDOMINAL AORTAGRAM N/A 11/24/2013   Procedure: ABDOMINAL AORTAGRAM WITH BILATERAL RUNOFF WITH POSSIBLE INTERVENTION;  Surgeon: Chuck Hint, MD;  Location: [facility] CATH LAB;  Service: Cardiovascular;  Laterality: N/A;  . ABDOMINAL AORTOGRAM W/LOWER EXTREMITY N/A 04/25/2019   Procedure: ABDOMINAL AORTOGRAM W/LOWER EXTREMITY;  Surgeon: Sherren Kerns, MD;  Location: [facility] INVASIVE CV LAB;  Service: Cardiovascular;  Laterality: N/A;  . ABDOMINAL AORTOGRAM W/LOWER EXTREMITY Bilateral 05/15/2019   Procedure: ABDOMINAL AORTOGRAM W/LOWER EXTREMITY;  Surgeon: Nada Libman, MD;   Location: [facility] INVASIVE CV LAB;  Service: Cardiovascular;  Laterality: Bilateral;  . AMPUTATION Left 12/02/2013   Procedure: LEFT 1ST TOE AMPUTATION;  Surgeon: Sherren Kerns, MD;  Location: [facility] OR;  Service: Vascular;  Laterality: Left;  . AMPUTATION Left 07/16/2019   Procedure: LEFT TRANSMETATARSAL AMPUTATION;  Surgeon: Nadara Mustard, MD;  Location: [facility] OR;  Service: Orthopedics;  Laterality: Left;  . CARDIAC CATHETERIZATION N/A 07/12/2016   Procedure: Left Heart Cath and Coronary Angiography;  Surgeon: Lennette Bihari, MD;  Location: [facility] INVASIVE CV LAB;  Service: Cardiovascular;  Laterality: N/A;  . CARDIAC CATHETERIZATION N/A 07/14/2016   Procedure: Intravascular Ultrasound/IVUS;  Surgeon: Yvonne Kendall, MD;  Location: [facility] INVASIVE CV LAB;  Service: Cardiovascular;  Laterality: N/A;  . COLONOSCOPY    . CORONARY ANGIOPLASTY  04/2013  . CORONARY ARTERY BYPASS GRAFT N/A 07/17/2016   Procedure: CORONARY ARTERY BYPASS GRAFTING (CABG)x3 using right greater saphenous vein harvested endoscopiclly and left internal mammary artery;  Surgeon: Kerin Perna, MD;  Location: [facility] OR;  Service: Open Heart Surgery;  Laterality: N/A;  . ENDARTERECTOMY FEMORAL Left 05/12/2019   Procedure: LEFT Femoral Endarterectomy;  Surgeon: Sherren Kerns, MD;  Location: [facility] OR;  Service: Vascular;  Laterality: Left;  . FEMORAL-POPLITEAL BYPASS GRAFT Left 12/02/2013   Procedure:  LEFT FEMORAL-BELOW KNEE POPLITEAL ARTERY BYPASS GRAFT;  Surgeon: Sherren Kerns, MD;  Location: [facility] OR;  Service: Vascular;  Laterality: Left;  . LEFT HEART CATHETERIZATION WITH CORONARY ANGIOGRAM N/A 05/19/2013   Procedure: LEFT HEART CATHETERIZATION WITH CORONARY ANGIOGRAM;  Surgeon: Micheline Chapman, MD;  Location: [facility] CATH LAB;  Service: Cardiovascular;  Laterality: N/A;  . LEFT HEART CATHETERIZATION WITH CORONARY ANGIOGRAM N/A 10/28/2014   Procedure: LEFT HEART CATHETERIZATION WITH CORONARY ANGIOGRAM;  Surgeon: Iran Ouch, MD;  Location: [facility] CATH  LAB;  Service: Cardiovascular;  Laterality: N/A;  . LOWER EXTREMITY ANGIOGRAM Left 05/12/2019   Procedure: Lower Extremity Angiogram;  Surgeon: Sherren Kerns, MD;  Location: [facility] OR;  Service: Vascular;  Laterality: Left;  . PATCH ANGIOPLASTY Left 05/12/2019   Procedure: Left Femoral Patch Angioplasty USING  CADAVERIC SAPHENOUS VEIN;  Surgeon: Elam Dutch, MD;  Location: [place];  Service: Vascular;  Laterality: Left;  . PERCUTANEOUS CORONARY STENT INTERVENTION (PCI-S)  05/19/2013   Procedure: PERCUTANEOUS CORONARY STENT INTERVENTION (PCI-S);  Surgeon: Blane Ohara, MD;  Location: [facility] CATH LAB;  Service: Cardiovascular;;  . PERMANENT PACEMAKER INSERTION N/A 11/04/2014   STJ Leadless pacemaker implanted by Dr Rayann Heman for tachy/brady  . TEE WITHOUT CARDIOVERSION N/A 07/17/2016   Procedure: TRANSESOPHAGEAL ECHOCARDIOGRAM (TEE);  Surgeon: Ivin Poot, MD;  Location: [place];  Service: Open Heart Surgery;  Laterality: N/A;  . THROMBECTOMY FEMORAL ARTERY Left 05/12/2019   Procedure: Left Ilio-Femoral Artery and Femoral-popliteal Graft Thrombectomy;  Surgeon: Elam Dutch, MD;  Location: [place];  Service: Vascular;  Laterality: Left;  . TUBAL LIGATION    . VISCERAL ANGIOGRAPHY N/A 04/25/2019   Procedure: MESENTRIC  ANGIOGRAPHY;  Surgeon: Elam Dutch, MD;  Location: [place] CV LAB;  Service: Cardiovascular;  Laterality: N/A;   Social History   Occupational History  . Occupation: retired  Tobacco Use  . Smoking status: Former Smoker    Types: Cigarettes    Quit date: 05/19/2016    Years since quitting: 3.3  . Smokeless tobacco: Never Used  . Tobacco comment:    Substance and Sexual Activity  . Alcohol use: No    Alcohol/week: 0.0 standard drinks  . Drug use: Yes    Types: Marijuana  . Sexual activity: Not on file

## 2019-09-23 ENCOUNTER — Ambulatory Visit (INDEPENDENT_AMBULATORY_CARE_PROVIDER_SITE_OTHER)
Admission: RE | Admit: 2019-09-23 | Discharge: 2019-09-23 | Disposition: A | Discharge status: Home / Self Care | Payer: Medicare Other | Source: Ambulatory Visit | Attending: Vascular Surgery | Admitting: Vascular Surgery

## 2019-09-23 ENCOUNTER — Ambulatory Visit (HOSPITAL_COMMUNITY)
Admission: RE | Admit: 2019-09-23 | Payer: Medicare Other | Source: Ambulatory Visit | Attending: Cardiology | Admitting: Cardiology

## 2019-09-23 ENCOUNTER — Other Ambulatory Visit: Payer: Self-pay

## 2019-09-23 ENCOUNTER — Other Ambulatory Visit: Payer: Self-pay | Admitting: *Deleted

## 2019-09-23 ENCOUNTER — Encounter: Payer: Self-pay | Admitting: *Deleted

## 2019-09-23 ENCOUNTER — Telehealth: Payer: Self-pay

## 2019-09-23 ENCOUNTER — Encounter: Payer: Self-pay | Admitting: Vascular Surgery

## 2019-09-23 ENCOUNTER — Ambulatory Visit (INDEPENDENT_AMBULATORY_CARE_PROVIDER_SITE_OTHER): Payer: Medicare Other | Admitting: Vascular Surgery

## 2019-09-23 ENCOUNTER — Telehealth: Payer: Self-pay | Admitting: *Deleted

## 2019-09-23 VITALS — BP 151/88 | HR 70 | Temp 98.1°F | Resp 20 | Ht 66.0 in | Wt 110.0 lb

## 2019-09-23 DIAGNOSIS — I739 Peripheral vascular disease, unspecified: Secondary | ICD-10-CM | POA: Insufficient documentation

## 2019-09-23 NOTE — Progress Notes (Deleted)
Subjective:    Patient ID: Mercedes [name], female    DOB: [birth year], 64 y.o.   MRN: [id number]  HPI The patient is here for follow up of their chronic medical problems, including PAD, A. fib, CAD, hypertension and diabetes  She is taking her medications daily as prescribed.  She recently saw Dr. Lajoyce Corners for left foot pain painful and cold.  She saw vascular surgery yesterday and she does have an ischemic foot.  Last year she did have a transmetatarsal amputation.  Tomorrow vascular will be doing exploration with possible thrombectomy, but she may need a BKA or AKA amputation.  She is currently holding the Eliquis.     Medications and allergies reviewed with patient and updated if appropriate.  Patient Active Problem List   Diagnosis Date Noted  . 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 Gangrene of left foot (HCC)   . Protein-calorie malnutrition, severe 05/15/2019  . Critical lower limb ischemia 05/09/2019  . Dyspnea 02/01/2019  . Weight loss, unintentional 02/01/2019  . Epigastric pain 02/01/2019  . 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 Acute pyelonephritis 10/20/2018  . Acute renal failure superimposed on stage 2 chronic kidney disease (HCC) 10/20/2018  . Diabetes (HCC) 04/01/2017  . Hyperthyroidism 02/21/2017  . Low mean corpuscular volume (MCV) 09/30/2016  . Coronary artery disease involving native heart without angina pectoris   . Hx of CABG 07/17/2016  . Paroxysmal atrial fibrillation (HCC) 07/12/2016  . NSTEMI (non-ST elevated myocardial infarction) (HCC) 07/11/2016  . History of arterial bypass of lower extremity 12/30/2015  . Claudication of right lower extremity (HCC) 12/30/2015  . Phantom limb pain-Left great toe 04/29/2015  . Seizure (HCC)   . Atrial flutter with rapid ventricular response vs. SVT 10/27/2014  . Atherosclerosis of native artery of left lower extremity with rest pain (HCC) 12/18/2013  . PAD (peripheral artery disease)  (HCC) 12/02/2013  . SLE (systemic lupus erythematosus) (HCC)   . CAD - S/P Takotsubo MI in 2000, CFX DES Sept 2014 05/25/2013  . Acute MI, inferior wall (HCC) 05/19/2013  . 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 Tachycardia-bradycardia (HCC) 03/09/2010  . PVD- s/p Lt [facility] April 2015 10/29/2009  . Dyslipidemia 08/26/2009  . Essential hypertension 08/26/2009  . CORONARY ATHEROSCLEROSIS NATIVE CORONARY ARTERY 08/26/2009  . 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 Rheumatoid arthritis (HCC) 08/26/2009    Current Outpatient Medications on File Prior to Visit  Medication Sig Dispense Refill  . DULoxetine (CYMBALTA) 30 MG capsule Take 1 capsule (30 mg total) by mouth daily. Qam 30 capsule 0  . ELIQUIS 5 MG TABS tablet TAKE 1 TABLET BY MOUTH TWICE DAILY (Patient taking differently: Take 5 mg by mouth 2 (two) times daily. ) 180 tablet 3  . lisinopril (ZESTRIL) 10 MG tablet Take 1 tablet (10 mg total) by mouth daily. 90 tablet 2  . metoprolol tartrate (LOPRESSOR) 25 MG tablet Take 0.5 tablets (12.5 mg total) by mouth 2 (two) times daily. 180 tablet 3  . Multiple Vitamin (MULTIVITAMIN WITH MINERALS) TABS tablet Take 1 tablet by mouth daily.    . nitroGLYCERIN (NITROSTAT) 0.4 MG SL tablet Place 1 tablet (0.4 mg total) under the tongue every 5 (five) minutes as needed for chest pain. 25 tablet 3  . oxyCODONE-acetaminophen (PERCOCET/ROXICET) 5-325 MG tablet Take 1 tablet by mouth every 4 (four) hours as needed for severe pain. 30 tablet 0  . rosuvastatin (CRESTOR) 40 MG tablet Take 1 tablet (40 mg total) by mouth  daily. 90 tablet 3   No current facility-administered medications on file prior to visit.    Past Medical History:  Diagnosis Date  . Abnormal LFTs    a. in the past when on Lipitor  . Anemia    as a young woman  . Anxiety   . Arnold-Chiari malformation, type I (HCC)    MRI brain 02/2010  . CAD (coronary artery disease)    a. NSTEMI 07/2009: LHC - D2 40%, LAD irreg., EF 50% with  apical AK (tako-tsubo CM);  b. Inf STEMI (04/2013): Tx Promus DES to CFX;  c. 08/2013 Lexi CL: No ischemia, dist ant/ap/inf-ap infarct, EF  d. 06/2016 NSTEMI with LM disease: s/p CABG on 07/17/2016 w/ LIMA-LAD, SVG-OM, and SVG-dRCA.  [name] DEPRESSION   . Dizziness    ? CVA 01/2010 - carotid dopplers with no ICA stenosis  . GERD (gastroesophageal reflux disease)   . HYPERLIPIDEMIA   . HYPERTENSION   . MI (myocardial infarction) (HCC)   . PAD (peripheral artery disease) (HCC)    s/p L fem pop 11/2013 in setting of 1st toe osteo/gangrene  . Paroxysmal atrial fibrillation (HCC)    a. anticoagulated with Eliquis 10/2014  . 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 RA (rheumatoid arthritis) (HCC)    prior tx by Dr. Dareen Piano  . Sjogren's syndrome (HCC) 06/02/13   pt denies this (12/01/13)  . SLE (systemic lupus erythematosus) (HCC) dx 05/2013   follows with rheum Dareen Piano)  . Tachy-brady syndrome (HCC)    a. s/p STJ Leadless pacemaker 11-04-2014 Dr Johney Frame  . 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 ABDOMINAL AORTAGRAM N/A 11/24/2013   Procedure: ABDOMINAL AORTAGRAM WITH BILATERAL RUNOFF WITH POSSIBLE INTERVENTION;  Surgeon: Chuck Hint, MD;  Location: [facility] CATH LAB;  Service: Cardiovascular;  Laterality: N/A;  . ABDOMINAL AORTOGRAM W/LOWER EXTREMITY N/A 04/25/2019   Procedure: ABDOMINAL AORTOGRAM W/LOWER EXTREMITY;  Surgeon: Sherren Kerns, MD;  Location: [facility] INVASIVE CV LAB;  Service: Cardiovascular;  Laterality: N/A;  . ABDOMINAL AORTOGRAM W/LOWER EXTREMITY Bilateral 05/15/2019   Procedure: ABDOMINAL AORTOGRAM W/LOWER EXTREMITY;  Surgeon: Nada Libman, MD;  Location: [facility] INVASIVE CV LAB;  Service: Cardiovascular;  Laterality: Bilateral;  . AMPUTATION Left 12/02/2013   Procedure: LEFT 1ST TOE AMPUTATION;  Surgeon: Sherren Kerns,  MD;  Location: [facility] OR;  Service: Vascular;  Laterality: Left;  . AMPUTATION Left 07/16/2019   Procedure: LEFT TRANSMETATARSAL AMPUTATION;  Surgeon: Nadara Mustard, MD;  Location: [facility] OR;  Service: Orthopedics;  Laterality: Left;  . CARDIAC CATHETERIZATION N/A 07/12/2016   Procedure: Left Heart Cath and Coronary Angiography;  Surgeon: Lennette Bihari, MD;  Location: [facility] INVASIVE CV LAB;  Service: Cardiovascular;  Laterality: N/A;  . CARDIAC CATHETERIZATION N/A 07/14/2016   Procedure: Intravascular Ultrasound/IVUS;  Surgeon: Yvonne Kendall, MD;  Location: [facility] INVASIVE CV LAB;  Service: Cardiovascular;  Laterality: N/A;  . COLONOSCOPY    . CORONARY ANGIOPLASTY  04/2013  . CORONARY ARTERY BYPASS GRAFT N/A 07/17/2016   Procedure: CORONARY ARTERY BYPASS GRAFTING (CABG)x3 using right greater saphenous vein harvested endoscopiclly and left internal mammary artery;  Surgeon: Kerin Perna, MD;  Location: [facility] OR;  Service: Open Heart Surgery;  Laterality: N/A;  . ENDARTERECTOMY FEMORAL Left 05/12/2019   Procedure: LEFT Femoral Endarterectomy;  Surgeon: Sherren Kerns, MD;  Location: [facility]  OR;  Service: Vascular;  Laterality: Left;  . FEMORAL-POPLITEAL BYPASS GRAFT Left 12/02/2013   Procedure:  LEFT FEMORAL-BELOW KNEE POPLITEAL ARTERY BYPASS GRAFT;  Surgeon: Sherren Kerns, MD;  Location: [facility] OR;  Service: Vascular;  Laterality: Left;  . LEFT HEART CATHETERIZATION WITH CORONARY ANGIOGRAM N/A 05/19/2013   Procedure: LEFT HEART CATHETERIZATION WITH CORONARY ANGIOGRAM;  Surgeon: Micheline Chapman, MD;  Location: [facility] CATH LAB;  Service: Cardiovascular;  Laterality: N/A;  . LEFT HEART CATHETERIZATION WITH CORONARY ANGIOGRAM N/A 10/28/2014   Procedure: LEFT HEART CATHETERIZATION WITH CORONARY ANGIOGRAM;  Surgeon: Iran Ouch, MD;  Location: [facility] CATH LAB;  Service: Cardiovascular;  Laterality: N/A;  . LOWER EXTREMITY ANGIOGRAM Left 05/12/2019   Procedure: Lower Extremity Angiogram;  Surgeon: Sherren Kerns, MD;   Location: [facility] OR;  Service: Vascular;  Laterality: Left;  . PATCH ANGIOPLASTY Left 05/12/2019   Procedure: Left Femoral Patch Angioplasty USING CADAVERIC SAPHENOUS VEIN;  Surgeon: Sherren Kerns, MD;  Location: [facility] OR;  Service: Vascular;  Laterality: Left;  . PERCUTANEOUS CORONARY STENT INTERVENTION (PCI-S)  05/19/2013   Procedure: PERCUTANEOUS CORONARY STENT INTERVENTION (PCI-S);  Surgeon: Micheline Chapman, MD;  Location: [facility] CATH LAB;  Service: Cardiovascular;;  . PERMANENT PACEMAKER INSERTION N/A 11/04/2014   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  . TEE WITHOUT CARDIOVERSION N/A 07/17/2016   Procedure: TRANSESOPHAGEAL ECHOCARDIOGRAM (TEE);  Surgeon: Kerin Perna, MD;  Location: [facility] OR;  Service: Open Heart Surgery;  Laterality: N/A;  . THROMBECTOMY FEMORAL ARTERY Left 05/12/2019   Procedure: Left Ilio-Femoral Artery and Femoral-popliteal Graft Thrombectomy;  Surgeon: Sherren Kerns, MD;  Location: [facility] OR;  Service: Vascular;  Laterality: Left;  . TUBAL LIGATION    . VISCERAL ANGIOGRAPHY N/A 04/25/2019   Procedure: MESENTRIC  ANGIOGRAPHY;  Surgeon: Sherren Kerns, MD;  Location: [facility] INVASIVE CV LAB;  Service: Cardiovascular;  Laterality: N/A;    Social History   Socioeconomic History  . Marital status: Single    Spouse name: Not on file  . Number of children: 3  . Years of education: Not on file  . Highest education level: Not on file  Occupational History  . Occupation: retired  Tobacco Use  . Smoking status: Former Smoker    Types: Cigarettes    Quit date: 05/19/2016    Years since quitting: 3.3  . Smokeless tobacco: Never Used  . Tobacco comment:    Substance and Sexual Activity  . Alcohol use: No    Alcohol/week: 0.0 standard drinks  . Drug use: Yes    Types: Marijuana  . Sexual activity: Not on file  Other Topics Concern  . Not on file  Social History Narrative   Lives alone.   Social Determinants of Health   Financial Resource Strain:   .  Difficulty of Paying Living Expenses: Not on file  Food Insecurity:   . Worried About Programme researcher, broadcasting/film/video in the Last Year: Not on file  . Ran Out of Food in the Last Year: Not on file  Transportation Needs:   . Lack of Transportation (Medical): Not on file  . Lack of Transportation (Non-Medical): Not on file  Physical Activity:   . Days of Exercise per Week: Not on file  . Minutes of Exercise per Session: Not on file  Stress:   . Feeling of Stress : Not on file  Social Connections:   . Frequency of Communication with Friends and Family: Not on file  . Frequency of Social  Gatherings with Friends and Family: Not on file  . Attends Religious Services: Not on file  . Active Member of Clubs or Organizations: Not on file  . Attends Archivist Meetings: Not on file  . Marital Status: Not on file    Family History  Problem Relation Age of Onset  . Arthritis Mother   . Emphysema Mother   . Arthritis Father   . COPD Father   . Heart disease Father   . Diabetes Paternal Aunt        paternal Aunts  . Heart disease Paternal Aunt   . Thyroid disease Neg Hx     Review of Systems     Objective:  There were no vitals filed for this visit. BP Readings from Last 3 Encounters:  09/23/19 (!) 151/88  09/18/19 (!) 152/86  07/18/19 (!) 162/82   Wt Readings from Last 3 Encounters:  09/23/19 110 lb (49.9 kg)  09/22/19 110 lb 9.6 oz (50.2 kg)  09/18/19 110 lb 9.6 oz (50.2 kg)   There is no height or weight on file to calculate BMI.   Physical Exam    Constitutional: Appears well-developed and well-nourished. No distress.  HENT:  Head: Normocephalic and atraumatic.  Neck: Neck supple. No tracheal deviation present. No thyromegaly present.  No cervical lymphadenopathy Cardiovascular: Normal rate, regular rhythm and normal heart sounds.   No murmur heard. No carotid bruit .  No edema Pulmonary/Chest: Effort normal and breath sounds normal. No respiratory distress. No has no  wheezes. No rales.  Skin: Skin is warm and dry. Not diaphoretic.  Psychiatric: Normal mood and affect. Behavior is normal.      Assessment & Plan:    See Problem List for Assessment and Plan of chronic medical problems.    This visit occurred during the SARS-CoV-2 public health emergency.  Safety protocols were in place, including screening questions prior to the visit, additional usage of staff PPE, and extensive cleaning of exam room while observing appropriate contact time as indicated for disinfecting solutions.

## 2019-09-23 NOTE — Telephone Encounter (Signed)
Request for Surgical Clearance  1. What type of surgery is being performed?  URGENT SURGERY  Thrombectomy Left femoral popliteal bypass graft     2. When is this surgery scheduled? 09/25/2019  3. What type of clearance is required (medical clearance vs. Pharmacy clearance to hold med vs. Both)? BOTH Cardiac clearance and ELIQUIS HOLD    4. Are there any medications that need to be held prior to surgery and how long?  ELIQUIS HOLD X 24 HOURS PRE-OP    5. Practice name and name of physician performing surgery?   [facility] [place]    6.  What is your office phone number? [phone]    7. What is your office fax number? (Be sure to include anyone who it needs to go Attn to) [phone]  ATTN. BECKY    8. Anesthesia type (None, local, MAC, general)? CHOICE   REMINDER TO USER: Remember to please route this message to P CV DIV PREOP in a phone note.

## 2019-09-23 NOTE — Telephone Encounter (Signed)
Christy with [facility] called stating that they need the STAT order for patient faxed to [phone].  Please advise.  Thank you.

## 2019-09-23 NOTE — Telephone Encounter (Signed)
Referrals sent urgently last night pt is being seen today by vascular.

## 2019-09-23 NOTE — Telephone Encounter (Signed)
Pt takes Eliquis for afib with CHADS2VASc score of 5-7 (age, sex, HTN, CAD/PVD, DM, ? CVA). Pt admitted 02/18/10 with dizziness, head CT stated "58mm vague hypodensity posterior right frontal love of uncertain chronicity. A small lacunar infarction, age indeterminate, cannot be excluded." SCr 0.91, CrCl 52mL/min.  Ok to hold Eliquis for 24 hours pre-op, resume as soon as possible after given ?hx of stroke and elevated cardiac risk.

## 2019-09-23 NOTE — Progress Notes (Signed)
[facility]ist of   Patient name: Mercedes [name] MRN: [id number] DOB: [birth year] Sex: female  REASON FOR VISIT: Evaluation of rest pain and critical ischemia left foot  HPI: Mercedes [name] is a 64 y.o. female presents as an urgent add-on today.  Has a long history of peripheral vascular occlusive disease.  Had undergone femoral to below-knee popliteal bypass with Mercedes. Darrick [name] in 2015.  She presented in September 2020 with occlusion and critical ischemia to her foot.  She underwent thrombectomy of her bypass and cadaver vein patch of the proximal anastomosis.  Underwent formal arteriogram 3 days following this showing single-vessel runoff via peroneal with occlusion at the ankle.  She separately underwent transmetatarsal amputation with Mercedes [name].  This has healed but she has had continued severe pain which has been progressive in her foot.  She is seen today for evaluation of this.  She is on Eliquis for atrial flutter.  Has a history of prior coronary artery bypass grafting in 2017.  Does not have a heart valve replacement.  Past Medical History:  Diagnosis Date  . Abnormal LFTs    a. in the past when on Lipitor  . Anemia    as a young woman  . Anxiety   . Arnold-Chiari malformation, type I (HCC)    MRI brain 02/2010  . CAD (coronary artery disease)    a. NSTEMI 07/2009: LHC - D2 40%, LAD irreg., EF 50% with apical AK (tako-tsubo CM);  b. Inf STEMI (04/2013): Tx Promus DES to CFX;  c. 08/2013 Lexi CL: No ischemia, dist ant/ap/inf-ap infarct, EF  d. 06/2016 NSTEMI with LM disease: s/p CABG on 07/17/2016 w/ LIMA-LAD, SVG-OM, and SVG-dRCA.  [name] DEPRESSION   . Dizziness    ? CVA 01/2010 - carotid dopplers with no ICA stenosis  . GERD (gastroesophageal reflux disease)   . HYPERLIPIDEMIA   . HYPERTENSION   . MI (myocardial infarction) (HCC)   . PAD (peripheral artery disease) (HCC)    s/p L fem pop 11/2013 in setting of 1st toe  osteo/gangrene  . Paroxysmal atrial fibrillation (HCC)    a. anticoagulated with Eliquis 10/2014  . 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 RA (rheumatoid arthritis) (HCC)    prior tx by Mercedes [name]  . Sjogren's syndrome (HCC) 06/02/13   pt denies this (12/01/13)  . SLE (systemic lupus erythematosus) (HCC) dx 05/2013   follows with rheum Mercedes [name])  . Tachy-brady syndrome (HCC)    a. s/p STJ Leadless pacemaker 11-04-2014 Mercedes [name]  . 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 Arthritis Mother   . Emphysema Mother   . Arthritis Father   . COPD Father   . Heart disease Father   . Diabetes Paternal Aunt        paternal Aunts  . Heart disease Paternal Aunt   . Thyroid disease Neg Hx     SOCIAL HISTORY: Social History   Tobacco Use  . Smoking status: Former Smoker    Types: Cigarettes    Quit date: 05/19/2016    Years since quitting: 3.3  . Smokeless tobacco: Never Used  . Tobacco comment:    Substance Use Topics  . Alcohol use: No    Alcohol/week: 0.0 standard drinks    Allergies  Allergen Reactions  . Brilinta [Ticagrelor]  Anaphylaxis and Other (See Comments)    Also, chest tightness  . Latex Rash    Current Outpatient Medications  Medication Sig Dispense Refill  . DULoxetine (CYMBALTA) 30 MG capsule Take 1 capsule (30 mg total) by mouth daily. Qam 30 capsule 0  . ELIQUIS 5 MG TABS tablet TAKE 1 TABLET BY MOUTH TWICE DAILY (Patient taking differently: Take 5 mg by mouth 2 (two) times daily. ) 180 tablet 3  . lisinopril (ZESTRIL) 10 MG tablet Take 1 tablet (10 mg total) by mouth daily. 90 tablet 2  . metoprolol tartrate (LOPRESSOR) 25 MG tablet Take 0.5 tablets (12.5 mg total) by mouth 2 (two) times daily. 180 tablet 3  . Multiple Vitamin (MULTIVITAMIN WITH MINERALS) TABS tablet Take 1 tablet by mouth daily.    .  nitroGLYCERIN (NITROSTAT) 0.4 MG SL tablet Place 1 tablet (0.4 mg total) under the tongue every 5 (five) minutes as needed for chest pain. 25 tablet 3  . oxyCODONE-acetaminophen (PERCOCET/ROXICET) 5-325 MG tablet Take 1 tablet by mouth every 4 (four) hours as needed for severe pain. 30 tablet 0  . rosuvastatin (CRESTOR) 40 MG tablet Take 1 tablet (40 mg total) by mouth daily. 90 tablet 3   No current facility-administered medications for this visit.    REVIEW OF SYSTEMS:  [X] denotes positive finding, [ ] denotes negative finding Cardiac  Comments:  Chest pain or chest pressure:    Shortness of breath upon exertion:    Short of breath when lying flat:    Irregular heart rhythm: x       Vascular    Pain in calf, thigh, or hip brought on by ambulation: x   Pain in feet at night that wakes you up from your sleep:  x   Blood clot in your veins:    Leg swelling:           PHYSICAL EXAM: Vitals:   09/23/19 1427  BP: (!) 151/88  Pulse: 70  Resp: 20  Temp: 98.1 F (36.7 C)  SpO2: 99%  Weight: 110 lb (49.9 kg)  Height: 5' 6" (1.676 m)    GENERAL: The patient is a well-nourished female, in no acute distress. The vital signs are documented above. CARDIOVASCULAR: Absent popliteal pulse on the left.  I do not have Doppler flow in her foot. PULMONARY: There is good air exchange  MUSCULOSKELETAL: There are no major deformities or cyanosis.  Her transmetatarsal amputation has healed.  There is some duskiness to this. NEUROLOGIC: No focal weakness or paresthesias are detected. SKIN: There are no ulcers or rashes noted. PSYCHIATRIC: The patient has a normal affect.  DATA:  Noninvasive duplex shows no flow in her bypass graft  MEDICAL ISSUES: Had a very long discussion with the patient.  She certainly is at risk for limb loss as she has been.  She understands this from prior discussion with Mercedes [name].  Feel that she does warrant reexploration and attempted thrombectomy.  If she has  Mercedes [name] failure I would then recommend amputation.  We discussed above-knee versus below-knee amputation and the concern regarding healing below-knee amputation but better rehab potential.  I have offered surgery for tomorrow.  She reports that she needs a day to get her affairs in order.  She has not taken her Eliquis today and I have asked her to hold it today and for tomorrow and plan surgery on the following day.    Geroldine Esquivias F. Makalya Nave, MD FACS [facility] [place] Office   Tel [phone] Pager [phone]

## 2019-09-23 NOTE — H&P (View-Only) (Signed)
Vascular and Vein Specialist of   Patient name: Mercedes [name] MRN: [id number] DOB: [birth year] Sex: female  REASON FOR VISIT: Evaluation of rest pain and critical ischemia left foot  HPI: Mercedes [name] is a 64 y.o. female presents as an urgent add-on today.  Has a long history of peripheral vascular occlusive disease.  Had undergone femoral to below-knee popliteal bypass with Dr. Darrick Penna in 2015.  She presented in September 2020 with occlusion and critical ischemia to her foot.  She underwent thrombectomy of her bypass and cadaver vein patch of the proximal anastomosis.  Underwent formal arteriogram 3 days following this showing single-vessel runoff via peroneal with occlusion at the ankle.  She separately underwent transmetatarsal amputation with Dr. Lajoyce Corners.  This has healed but she has had continued severe pain which has been progressive in her foot.  She is seen today for evaluation of this.  She is on Eliquis for atrial flutter.  Has a history of prior coronary artery bypass grafting in 2017.  Does not have a heart valve replacement.  Past Medical History:  Diagnosis Date  . Abnormal LFTs    a. in the past when on Lipitor  . Anemia    as a young woman  . Anxiety   . Arnold-Chiari malformation, type I (HCC)    MRI brain 02/2010  . CAD (coronary artery disease)    a. NSTEMI 07/2009: LHC - D2 40%, LAD irreg., EF 50% with apical AK (tako-tsubo CM);  b. Inf STEMI (04/2013): Tx Promus DES to CFX;  c. 08/2013 Lexi CL: No ischemia, dist ant/ap/inf-ap infarct, EF  d. 06/2016 NSTEMI with LM disease: s/p CABG on 07/17/2016 w/ LIMA-LAD, SVG-OM, and SVG-dRCA.  [name] DEPRESSION   . Dizziness    ? CVA 01/2010 - carotid dopplers with no ICA stenosis  . GERD (gastroesophageal reflux disease)   . HYPERLIPIDEMIA   . HYPERTENSION   . MI (myocardial infarction) (HCC)   . PAD (peripheral artery disease) (HCC)    s/p L fem pop 11/2013 in setting of 1st toe  osteo/gangrene  . Paroxysmal atrial fibrillation (HCC)    a. anticoagulated with Eliquis 10/2014  . 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 RA (rheumatoid arthritis) (HCC)    prior tx by Dr. Dareen Piano  . Sjogren's syndrome (HCC) 06/02/13   pt denies this (12/01/13)  . SLE (systemic lupus erythematosus) (HCC) dx 05/2013   follows with rheum Dareen Piano)  . Tachy-brady syndrome (HCC)    a. s/p STJ Leadless pacemaker 11-04-2014 Dr Johney Frame  . 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 Arthritis Mother   . Emphysema Mother   . Arthritis Father   . COPD Father   . Heart disease Father   . Diabetes Paternal Aunt        paternal Aunts  . Heart disease Paternal Aunt   . Thyroid disease Neg Hx     SOCIAL HISTORY: Social History   Tobacco Use  . Smoking status: Former Smoker    Types: Cigarettes    Quit date: 05/19/2016    Years since quitting: 3.3  . Smokeless tobacco: Never Used  . Tobacco comment:    Substance Use Topics  . Alcohol use: No    Alcohol/week: 0.0 standard drinks    Allergies  Allergen Reactions  . Brilinta [Ticagrelor]  Anaphylaxis and Other (See Comments)    Also, chest tightness  . Latex Rash    Current Outpatient Medications  Medication Sig Dispense Refill  . DULoxetine (CYMBALTA) 30 MG capsule Take 1 capsule (30 mg total) by mouth daily. Qam 30 capsule 0  . ELIQUIS 5 MG TABS tablet TAKE 1 TABLET BY MOUTH TWICE DAILY (Patient taking differently: Take 5 mg by mouth 2 (two) times daily. ) 180 tablet 3  . lisinopril (ZESTRIL) 10 MG tablet Take 1 tablet (10 mg total) by mouth daily. 90 tablet 2  . metoprolol tartrate (LOPRESSOR) 25 MG tablet Take 0.5 tablets (12.5 mg total) by mouth 2 (two) times daily. 180 tablet 3  . Multiple Vitamin (MULTIVITAMIN WITH MINERALS) TABS tablet Take 1 tablet by mouth daily.    .  nitroGLYCERIN (NITROSTAT) 0.4 MG SL tablet Place 1 tablet (0.4 mg total) under the tongue every 5 (five) minutes as needed for chest pain. 25 tablet 3  . oxyCODONE-acetaminophen (PERCOCET/ROXICET) 5-325 MG tablet Take 1 tablet by mouth every 4 (four) hours as needed for severe pain. 30 tablet 0  . rosuvastatin (CRESTOR) 40 MG tablet Take 1 tablet (40 mg total) by mouth daily. 90 tablet 3   No current facility-administered medications for this visit.    REVIEW OF SYSTEMS:  [X]  denotes positive finding, [ ]  denotes negative finding Cardiac  Comments:  Chest pain or chest pressure:    Shortness of breath upon exertion:    Short of breath when lying flat:    Irregular heart rhythm: x       Vascular    Pain in calf, thigh, or hip brought on by ambulation: x   Pain in feet at night that wakes you up from your sleep:  x   Blood clot in your veins:    Leg swelling:           PHYSICAL EXAM: Vitals:   09/23/19 1427  BP: (!) 151/88  Pulse: 70  Resp: 20  Temp: 98.1 F (36.7 C)  SpO2: 99%  Weight: 110 lb (49.9 kg)  Height: 5\' 6"  (1.676 m)    GENERAL: The patient is a well-nourished female, in no acute distress. The vital signs are documented above. CARDIOVASCULAR: Absent popliteal pulse on the left.  I do not have Doppler flow in her foot. PULMONARY: There is good air exchange  MUSCULOSKELETAL: There are no major deformities or cyanosis.  Her transmetatarsal amputation has healed.  There is some duskiness to this. NEUROLOGIC: No focal weakness or paresthesias are detected. SKIN: There are no ulcers or rashes noted. PSYCHIATRIC: The patient has a normal affect.  DATA:  Noninvasive duplex shows no flow in her bypass graft  MEDICAL ISSUES: Had a very long discussion with the patient.  She certainly is at risk for limb loss as she has been.  She understands this from prior discussion with Dr. Oneida Alar.  Feel that she does warrant reexploration and attempted thrombectomy.  If she has  Kaiyana Bedore failure I would then recommend amputation.  We discussed above-knee versus below-knee amputation and the concern regarding healing below-knee amputation but better rehab potential.  I have offered surgery for tomorrow.  She reports that she needs a day to get her affairs in order.  She has not taken her Eliquis today and I have asked her to hold it today and for tomorrow and plan surgery on the following day.    [name], MD FACS V[facility] Vein Specialists of [facility]  Tel [phone] Pager [phone]

## 2019-09-23 NOTE — Telephone Encounter (Signed)
   Primary Cardiologist: Olga Millers, MD  Chart reviewed as part of pre-operative protocol coverage.  She was recently seen in the office by Dr. Jens Som on 09/18/19. She has a history of CABG and PAF. She was having no anginal symptoms. She was remaining in sinus rhythm on beta blocker. She has a pacemaker.   Given her current clinical status, based on ACC/AHA guidelines, STEVE GREGG would be at acceptable risk for the planned procedure without further cardiovascular testing.  Pt takes Eliquis for afib with CHADS2VASc score of 5-7 (age, sex, HTN, CAD/PVD, DM, ? CVA). Pt admitted 02/18/10 with dizziness, head CT stated "42mm vague hypodensity posterior right frontal love of uncertain chronicity. A small lacunar infarction, age indeterminate, cannot be excluded." SCr 0.91, CrCl 31mL/min.  Ok to hold Eliquis for 24 hours pre-op, resume as soon as possible after given ?hx of stroke and elevated cardiac risk.  Per review of Dr. Bosie Helper office note, the patient has already been instructed to hold Eliquis for planned surgery. I attempted to call her to have her take Eliquis tonight and hold for tomorrow but got no answer on several calls.   I will route this recommendation to the requesting party via Epic fax function and remove from pre-op pool.  Please call with questions.  Berton Bon, NP 09/23/2019, 5:04 PM

## 2019-09-24 ENCOUNTER — Ambulatory Visit: Payer: Medicare Other | Admitting: Internal Medicine

## 2019-09-24 ENCOUNTER — Telehealth: Payer: Self-pay | Admitting: *Deleted

## 2019-09-24 ENCOUNTER — Other Ambulatory Visit (HOSPITAL_COMMUNITY)
Admission: RE | Admit: 2019-09-24 | Discharge: 2019-09-24 | Disposition: A | Discharge status: Home / Self Care | Payer: Medicare Other | Source: Ambulatory Visit | Attending: Vascular Surgery | Admitting: Vascular Surgery

## 2019-09-24 ENCOUNTER — Encounter (HOSPITAL_COMMUNITY): Payer: Self-pay | Admitting: Vascular Surgery

## 2019-09-24 ENCOUNTER — Other Ambulatory Visit: Payer: Self-pay

## 2019-09-24 DIAGNOSIS — Z20822 Contact with and (suspected) exposure to covid-19: Secondary | ICD-10-CM | POA: Insufficient documentation

## 2019-09-24 DIAGNOSIS — Z01812 Encounter for preprocedural laboratory examination: Secondary | ICD-10-CM | POA: Insufficient documentation

## 2019-09-24 LAB — SARS CORONAVIRUS 2 (TAT 6-24 HRS): SARS Coronavirus 2: NEGATIVE

## 2019-09-24 NOTE — Progress Notes (Signed)
Anesthesia Chart Review: Same day workup  History of severe PAD (s/p left FPBG, left first toe amputation 12/02/13; acute/subacute occlusion s/p thrombectomy 05/12/19, left transmetatarsal amputation 07/16/19). Per Dr. Bosie [name] note 09/23/19, pt currently with rest pain and critical ischemia of left foot.  Follows with cardiology for hx of CAD (NSTEMI 07/2009, Takotsubo cardiomyopathy; inferior STEMI s/p DES CX 04/2013; NSTEMI 06/2016, s/p CABG: LIMA-LAD, SVG-OM, SVG-dRCA 07/17/16), tachy-brady syndrome (s/p St. Jude Leadless PPM 11/04/14, not responding to magnet 09/2018, patient chose not to replace), PAF, PAD, HTN.  Cardiac clearance per telephone encounter 09/23/19, "She was recently seen in the office by Dr. Jens [name] on 09/18/19. She has a history of CABG and PAF. She was having no anginal symptoms. She was remaining in sinus rhythm on beta blocker. She has a pacemaker. Given her current clinical status, based on ACC/AHA guidelines, Mercedes [name] would be at acceptable risk for the planned procedure without further cardiovascular testing. Pt takes Eliquis for afib with CHADS2VASc score of 5-7 (age, sex, HTN, CAD/PVD, DM, ? CVA). Pt admitted 02/18/10 with dizziness, head CT stated"60mm vague hypodensity posterior right frontal love of uncertain chronicity. A small lacunar infarction, age indeterminate, cannot be excluded."SCr 0.91, CrCl 3mL/min. Ok to hold Eliquis for 24 hours pre-op, resume as soon as possible after given ?hx of stroke and elevated cardiac risk."  Pt also follows with EP cardiology for history tachy/brady syndrome s/p St Jude leadless PPM 2016. She was last seen Feb 2020 and at that time device was at RRT and not responding appropriately to magnet. It was decided to pursue watchful waiting as pt was doing well overall. I previously reached out to EP cardiology NP Mercedes [name] prior to pt's endarterectomy on 05/12/19. Per [facility], the pt did not need any further EP eval prior to surgery and  nothing special needed to be done perioperatively as the pt has elected to not replace the device due to absence of symptoms.   Will need DOS labs and eval.  EKG: 05/07/2019: Sinus rhythm with premature atrial complexes. Left axis deviation. Abnormal ECG  TEE (intra-op CABG 07/17/16):  Left ventricle: Normal cavity size. Concentric hypertrophy of mild severity. LV systolic function is normal with an EF of 60-65%. There are no obvious wall motion abnormalities. No thrombus present. No mass present.  Aortic valve: No AV vegetation.  Mitral valve: No leaflet thickening and calcification present. Trace regurgitation.  Right ventricle: Normal cavity size, wall thickness and ejection fraction.  Tricuspid valve: Trace regurgitation. The tricuspid valve regurgitation jet is central.  Pulmonic valve: Trace regurgitation.  Mercedes [name] [facility]/Anesthesiology Phone [phone] 09/24/2019 12:34 PM

## 2019-09-24 NOTE — Progress Notes (Signed)
Pt denies any acute cardiopulmonary issues. Pt stated that she is under the care of Dr. Jens Som, Cardiology Dr. Johney Frame, Cardiology-EP and Dr. Cheryll Cockayne, PCP. Pt stated that last dose of Eliquis was Monday. Pt made aware to stop taking  vitamins, fish oil and herbal medications. Do not take any NSAIDs ie: Ibuprofen, Advil, Naproxen (Aleve), Motrin, BC and Goody Powder. Peri-op prescription for ICD initiated; awaiting response. Pt reminded to quarantine. Pt verbalized understanding of all pre-op instructions.

## 2019-09-24 NOTE — Anesthesia Preprocedure Evaluation (Addendum)
Anesthesia Evaluation  Patient identified by MRN, date of birth, ID band Patient awake    Reviewed: Allergy & Precautions, H&P , NPO status , Patient's Chart, lab work & pertinent test results, reviewed documented beta blocker date and time   Airway Mallampati: II  TM Distance: >3 FB Neck ROM: Full    Dental  (+) Edentulous Upper, Partial Lower, Dental Advisory Given   Pulmonary neg pulmonary ROS, former smoker,    Pulmonary exam normal        Cardiovascular hypertension, Pt. on medications and Pt. on home beta blockers + CAD, + Past MI, + Cardiac Stents, + CABG and + Peripheral Vascular Disease  Normal cardiovascular exam+ dysrhythmias Atrial Fibrillation + pacemaker    Hx Takotsubo cardiomyopathy 2010  '17 TEE - Concentric hypertrophy of mild severity. EF of 60-65%. Trace MR, TR, and PR.  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  Cardiac clearance per telephone encounter 09/23/19, "She was recently seen in the office by Dr. Jens Som on 09/18/19. She has a history of CABG and PAF. She was having no anginal symptoms. She was remaining in sinus rhythm on beta blocker. She has a pacemaker. Given her current clinical status,based on ACC/AHA guidelines,Mercedes L McCollumwould be at acceptable risk for the planned procedure without further cardiovascular testing. Pt takes Eliquis for afib with CHADS2VASc score of 5-7 (age, sex, HTN, CAD/PVD, DM, ? CVA). Ok to hold Eliquis for 24 hours pre-op, resume as soon as possible after given ?hx of stroke and elevated cardiac risk."  Pt also follows with EP cardiology for history tachy/brady syndrome s/p St Jude leadless PPM 2016. She  was last seen Feb 2020 and at that time device was at RRT and not responding appropriately to magnet. It was decided to pursue watchful waiting as pt was doing well overall. I previously reached out to EP cardiology NP [name] prior to pt's endarterectomy on 05/12/19. Per [facility], the pt did not need any further EP eval prior to surgery and nothing special needed to be done perioperatively as the pt has elected to not replace the device due to absence of symptoms.     Neuro/Psych  Headaches, PSYCHIATRIC DISORDERS Anxiety Depression  Arnold-Chiari Type I Denies hx seizures     GI/Hepatic Neg liver ROS, GERD  Controlled,  Endo/Other    Renal/GU negative Renal ROS     Musculoskeletal  (+) Arthritis , Osteoarthritis and Rheumatoid disorders,    Abdominal   Peds  Hematology  (+) Blood dyscrasia, anemia ,   Anesthesia Other Findings Covid neg 1/27   Reproductive/Obstetrics                          Anesthesia Physical Anesthesia Plan  ASA: IV  Anesthesia Plan: General   Post-op Pain Management:    Induction: Intravenous  PONV Risk Score and Plan: 4 or greater and Ondansetron, Dexamethasone, Treatment may vary due to age or medical condition and Midazolam  Airway Management Planned: LMA  Additional Equipment: None  Intra-op Plan:   Post-operative Plan: Extubation in OR  Informed Consent: I have reviewed the patients History and Physical, chart, labs and discussed the procedure including the risks, benefits and alternatives for the proposed anesthesia with the patient or  authorized representative who has indicated his/her understanding and acceptance.     Dental advisory given  Plan Discussed with: CRNA and Anesthesiologist  Anesthesia Plan Comments:      Anesthesia Quick Evaluation                                  Anesthesia Evaluation  Patient identified by MRN, date of birth, ID band Patient awake    Reviewed: Allergy &  Precautions, NPO status , Patient's Chart, lab work & pertinent test results  History of Anesthesia Complications Negative for: history of anesthetic complications  Airway Mallampati: II  TM Distance: >3 FB Neck ROM: Full    Dental  (+) Dental Advisory Given, Edentulous Upper, Partial Lower   Pulmonary former smoker,    Pulmonary exam normal        Cardiovascular hypertension, Pt. on medications and Pt. on home beta blockers + CAD, + Past MI, + Cardiac Stents and + Peripheral Vascular Disease  Normal cardiovascular exam+ dysrhythmias Atrial Fibrillation + pacemaker    Hx Takotsubo cardiomyopathy 2010  '17 TEE - Concentric hypertrophy of mild severity. EF of 60-65%. Trace MR, TR, and PR.  Per note "Preop cardiac evaluation-at this time she is unable to perform 4 METS of activity to assess her cardiac health. Chart reviewed as part of pre-operative protocol coverage. Because of Mercedes [name]'s past medical history, she will require a Lexiscan Myoview stress test in order to better assess preoperative cardiovascular risk. Her RCRI riskis class IV,with a 11% risk of a major cardiac event. Reviewed with Dr. Gilman Schmidt. Further cardiac risk evaluation is needed." Pt also follows with EP Cardiology for tachy/brady syndrome s/p St Jude leadless PPM 2016. She was last seen Feb 2020 and at that time device was at RRT and not responding appropriately to magnet. It was decided to pursue watchful waiting as pt was doing well overall. I reached out to EP cardiology NP Chanetta Marshall and she said the pt did not need any further EP eval prior to surgery and that nothing special needed to be done perioperatively as the pt has elected to not replace the device due to absence of symptoms.  Pt presented to ED 05/09/19 and admitted with worsening foot pain. Outpatient stress test not done as it was scheduled to be done on day of admission.      Neuro/Psych PSYCHIATRIC DISORDERS Anxiety  Depression  Arnold-Chiari Type I Denies hx seizures  TIA   GI/Hepatic Neg liver ROS, GERD  Controlled and Medicated,  Endo/Other   Denies hx of DM   Renal/GU negative Renal ROS     Musculoskeletal  (+) Arthritis , Rheumatoid disorders,    Abdominal   Peds  Hematology negative hematology ROS (+)   Anesthesia Other Findings SLE Sjogren's syndrome    Reproductive/Obstetrics                           Anesthesia Physical Anesthesia Plan  ASA: IV  Anesthesia Plan: General   Post-op Pain Management:    Induction: Intravenous  PONV Risk Score and Plan: 4 or greater and Treatment may vary due to age or medical condition, Ondansetron and Dexamethasone  Airway Management Planned: Oral ETT  Additional Equipment: Arterial line  Intra-op Plan:   Post-operative Plan: Extubation in OR  Informed Consent: I have reviewed the patients History and Physical, chart, labs and discussed the  procedure including the risks, benefits and alternatives for the proposed anesthesia with the patient or authorized representative who has indicated his/her understanding and acceptance.     Dental advisory given  Plan Discussed with: CRNA and Anesthesiologist  Anesthesia Plan Comments:    Anesthesia Quick Evaluation

## 2019-09-24 NOTE — Telephone Encounter (Signed)
-----   Message from Awilda Metro, [facility] sent at 09/24/2019  7:23 AM EST ----- Regarding: RE: eliguis hold for surgery She should be ok with 48 hour hold too, thanks for the update ----- Message ----- From: Retta Mac, RN Sent: 09/23/2019   5:07 PM EST To: Awilda Metro, RPH Subject: eliguis hold for surgery                       TYPO Mercedes [name] was instructed to hold ELIQUIS for 48 hours prior to surgery on 09/25/2019. I typed incorrect 24 hours. [facility] service protocol is 48 hours. Sorry for my typo. Becky

## 2019-09-25 ENCOUNTER — Other Ambulatory Visit (HOSPITAL_COMMUNITY): Payer: Medicare Other

## 2019-09-25 ENCOUNTER — Encounter (HOSPITAL_COMMUNITY)
Admission: RE | Disposition: A | Discharge status: Home Health | Payer: Self-pay | Source: Home / Self Care | Attending: Vascular Surgery

## 2019-09-25 ENCOUNTER — Inpatient Hospital Stay (HOSPITAL_COMMUNITY): Payer: Medicare Other | Admitting: Physician Assistant

## 2019-09-25 ENCOUNTER — Encounter (HOSPITAL_COMMUNITY): Payer: Self-pay | Admitting: Vascular Surgery

## 2019-09-25 ENCOUNTER — Encounter (HOSPITAL_COMMUNITY): Payer: Medicare Other

## 2019-09-25 ENCOUNTER — Ambulatory Visit: Payer: Medicare Other | Admitting: Vascular Surgery

## 2019-09-25 ENCOUNTER — Other Ambulatory Visit: Payer: Self-pay

## 2019-09-25 ENCOUNTER — Inpatient Hospital Stay (HOSPITAL_COMMUNITY)
Admission: RE | Admit: 2019-09-25 | Discharge: 2019-10-06 | DRG: 240 | Disposition: A | Discharge status: Home Health | Payer: Medicare Other | Attending: Vascular Surgery | Admitting: Vascular Surgery

## 2019-09-25 DIAGNOSIS — Z8261 Family history of arthritis: Secondary | ICD-10-CM | POA: Diagnosis not present

## 2019-09-25 DIAGNOSIS — T82868A Thrombosis of vascular prosthetic devices, implants and grafts, initial encounter: Secondary | ICD-10-CM | POA: Diagnosis not present

## 2019-09-25 DIAGNOSIS — M86172 Other acute osteomyelitis, left ankle and foot: Secondary | ICD-10-CM | POA: Diagnosis not present

## 2019-09-25 DIAGNOSIS — Z825 Family history of asthma and other chronic lower respiratory diseases: Secondary | ICD-10-CM | POA: Diagnosis not present

## 2019-09-25 DIAGNOSIS — T82898A Other specified complication of vascular prosthetic devices, implants and grafts, initial encounter: Secondary | ICD-10-CM | POA: Diagnosis present

## 2019-09-25 DIAGNOSIS — Z79899 Other long term (current) drug therapy: Secondary | ICD-10-CM

## 2019-09-25 DIAGNOSIS — L97124 Non-pressure chronic ulcer of left thigh with necrosis of bone: Secondary | ICD-10-CM | POA: Diagnosis not present

## 2019-09-25 DIAGNOSIS — Z951 Presence of aortocoronary bypass graft: Secondary | ICD-10-CM | POA: Diagnosis not present

## 2019-09-25 DIAGNOSIS — I4892 Unspecified atrial flutter: Secondary | ICD-10-CM | POA: Diagnosis present

## 2019-09-25 DIAGNOSIS — I119 Hypertensive heart disease without heart failure: Secondary | ICD-10-CM | POA: Diagnosis present

## 2019-09-25 DIAGNOSIS — Z20822 Contact with and (suspected) exposure to covid-19: Secondary | ICD-10-CM | POA: Diagnosis not present

## 2019-09-25 DIAGNOSIS — G546 Phantom limb syndrome with pain: Secondary | ICD-10-CM | POA: Diagnosis not present

## 2019-09-25 DIAGNOSIS — G459 Transient cerebral ischemic attack, unspecified: Secondary | ICD-10-CM | POA: Diagnosis not present

## 2019-09-25 DIAGNOSIS — E785 Hyperlipidemia, unspecified: Secondary | ICD-10-CM | POA: Diagnosis present

## 2019-09-25 DIAGNOSIS — I495 Sick sinus syndrome: Secondary | ICD-10-CM | POA: Diagnosis present

## 2019-09-25 DIAGNOSIS — M069 Rheumatoid arthritis, unspecified: Secondary | ICD-10-CM | POA: Diagnosis not present

## 2019-09-25 DIAGNOSIS — Z87891 Personal history of nicotine dependence: Secondary | ICD-10-CM | POA: Diagnosis not present

## 2019-09-25 DIAGNOSIS — Z8249 Family history of ischemic heart disease and other diseases of the circulatory system: Secondary | ICD-10-CM

## 2019-09-25 DIAGNOSIS — M329 Systemic lupus erythematosus, unspecified: Secondary | ICD-10-CM | POA: Diagnosis not present

## 2019-09-25 DIAGNOSIS — I739 Peripheral vascular disease, unspecified: Secondary | ICD-10-CM | POA: Diagnosis not present

## 2019-09-25 DIAGNOSIS — I998 Other disorder of circulatory system: Secondary | ICD-10-CM | POA: Diagnosis present

## 2019-09-25 DIAGNOSIS — Z7901 Long term (current) use of anticoagulants: Secondary | ICD-10-CM

## 2019-09-25 DIAGNOSIS — I2510 Atherosclerotic heart disease of native coronary artery without angina pectoris: Secondary | ICD-10-CM | POA: Diagnosis present

## 2019-09-25 DIAGNOSIS — Z89432 Acquired absence of left foot: Secondary | ICD-10-CM | POA: Diagnosis not present

## 2019-09-25 DIAGNOSIS — Y832 Surgical operation with anastomosis, bypass or graft as the cause of abnormal reaction of the patient, or of later complication, without mention of misadventure at the time of the procedure: Secondary | ICD-10-CM | POA: Diagnosis present

## 2019-09-25 DIAGNOSIS — I70222 Atherosclerosis of native arteries of extremities with rest pain, left leg: Secondary | ICD-10-CM | POA: Diagnosis not present

## 2019-09-25 DIAGNOSIS — Z950 Presence of cardiac pacemaker: Secondary | ICD-10-CM

## 2019-09-25 DIAGNOSIS — Z833 Family history of diabetes mellitus: Secondary | ICD-10-CM

## 2019-09-25 DIAGNOSIS — I252 Old myocardial infarction: Secondary | ICD-10-CM | POA: Diagnosis not present

## 2019-09-25 DIAGNOSIS — I480 Paroxysmal atrial fibrillation: Secondary | ICD-10-CM | POA: Diagnosis present

## 2019-09-25 HISTORY — DX: Presence of dental prosthetic device (complete) (partial): Z97.2

## 2019-09-25 HISTORY — PX: FEMORAL-POPLITEAL BYPASS GRAFT: SHX937

## 2019-09-25 HISTORY — DX: Headache, unspecified: R51.9

## 2019-09-25 HISTORY — DX: Presence of spectacles and contact lenses: Z97.3

## 2019-09-25 LAB — COMPREHENSIVE METABOLIC PANEL
ALT: 12 U/L (ref 0–44)
AST: 20 U/L (ref 15–41)
Albumin: 3.2 g/dL — ABNORMAL LOW (ref 3.5–5.0)
Alkaline Phosphatase: 68 U/L (ref 38–126)
Anion gap: 8 (ref 5–15)
BUN: 10 mg/dL (ref 8–23)
CO2: 23 mmol/L (ref 22–32)
Calcium: 9.4 mg/dL (ref 8.9–10.3)
Chloride: 104 mmol/L (ref 98–111)
Creatinine, Ser: 0.83 mg/dL (ref 0.44–1.00)
GFR calc Af Amer: >60 mL/min (ref >60)
GFR calc non Af Amer: >60 mL/min (ref >60)
Glucose, Bld: 124 mg/dL — ABNORMAL HIGH (ref 70–99)
Potassium: 4.1 mmol/L (ref 3.5–5.1)
Sodium: 135 mmol/L (ref 135–145)
Total Bilirubin: 0.6 mg/dL (ref 0.3–1.2)
Total Protein: 7.8 g/dL (ref 6.5–8.1)

## 2019-09-25 LAB — CBC
HCT: 40 % (ref 36.0–46.0)
HCT: 38.4 % (ref 36.0–46.0)
Hemoglobin: 12.3 g/dL (ref 12.0–15.0)
Hemoglobin: 11.7 g/dL — ABNORMAL LOW (ref 12.0–15.0)
MCH: 24.4 pg — ABNORMAL LOW (ref 26.0–34.0)
MCH: 24.6 pg — ABNORMAL LOW (ref 26.0–34.0)
MCHC: 30.8 g/dL (ref 30.0–36.0)
MCHC: 30.5 g/dL (ref 30.0–36.0)
MCV: 79.2 fL — ABNORMAL LOW (ref 80.0–100.0)
MCV: 80.8 fL (ref 80.0–100.0)
Platelets: 412 10*3/uL — ABNORMAL HIGH (ref 150–400)
Platelets: 392 10*3/uL (ref 150–400)
RBC: 5.05 MIL/uL (ref 3.87–5.11)
RBC: 4.75 MIL/uL (ref 3.87–5.11)
RDW: 15.6 % — ABNORMAL HIGH (ref 11.5–15.5)
RDW: 15.7 % — ABNORMAL HIGH (ref 11.5–15.5)
WBC: 7.1 10*3/uL (ref 4.0–10.5)
WBC: 9.4 10*3/uL (ref 4.0–10.5)
nRBC: 0 % (ref 0.0–0.2)
nRBC: 0 % (ref 0.0–0.2)

## 2019-09-25 LAB — CREATININE, SERUM
Creatinine, Ser: 0.93 mg/dL (ref 0.44–1.00)
GFR calc Af Amer: >60 mL/min (ref >60)
GFR calc non Af Amer: >60 mL/min (ref >60)

## 2019-09-25 LAB — TYPE AND SCREEN
ABO/RH(D): B POS
Antibody Screen: NEGATIVE

## 2019-09-25 LAB — PROTIME-INR
INR: 1 (ref 0.8–1.2)
Prothrombin Time: 13.4 seconds (ref 11.4–15.2)

## 2019-09-25 LAB — APTT: aPTT: 30 seconds (ref 24–36)

## 2019-09-25 SURGERY — BYPASS GRAFT FEMORAL-POPLITEAL ARTERY
Anesthesia: General | Site: Leg Upper | Laterality: Left

## 2019-09-25 MED ORDER — SODIUM CHLORIDE 0.9 % IV SOLN
INTRAVENOUS | Status: DC | PRN
  Administered 2019-09-25: 500 mL

## 2019-09-25 MED ORDER — ACETAMINOPHEN 325 MG PO TABS
325.0000 mg | ORAL_TABLET | ORAL | Status: DC | PRN
  Administered 2019-09-30 – 2019-10-04 (×12): 650 mg via ORAL
  Filled 2019-09-25 (×12): qty 2

## 2019-09-25 MED ORDER — ACETAMINOPHEN 325 MG RE SUPP: 325.0000 mg | RECTAL | Status: DC | PRN

## 2019-09-25 MED ORDER — CEFAZOLIN SODIUM-DEXTROSE 2-4 GM/100ML-% IV SOLN
2.0000 g | INTRAVENOUS | Status: AC
  Administered 2019-09-25: 2 g via INTRAVENOUS
  Filled 2019-09-25: qty 100

## 2019-09-25 MED ORDER — METOPROLOL TARTRATE 5 MG/5ML IV SOLN: 2.0000 mg | INTRAVENOUS | Status: DC | PRN

## 2019-09-25 MED ORDER — DEXAMETHASONE SODIUM PHOSPHATE 10 MG/ML IJ SOLN
INTRAMUSCULAR | Status: DC | PRN
  Administered 2019-09-25: 5 mg via INTRAVENOUS

## 2019-09-25 MED ORDER — HYDROMORPHONE HCL 1 MG/ML IJ SOLN
0.5000 mg | INTRAMUSCULAR | Status: DC | PRN
  Administered 2019-09-25 (×2): 1 mg via INTRAVENOUS
  Administered 2019-09-26: 0.5 mg via INTRAVENOUS
  Administered 2019-09-26 – 2019-10-01 (×12): 1 mg via INTRAVENOUS
  Filled 2019-09-25 (×16): qty 1

## 2019-09-25 MED ORDER — FENTANYL CITRATE (PF) 250 MCG/5ML IJ SOLN
INTRAMUSCULAR | Status: AC
  Filled 2019-09-25: qty 5

## 2019-09-25 MED ORDER — SODIUM CHLORIDE 0.9 % IV SOLN
INTRAVENOUS | Status: AC
  Filled 2019-09-25: qty 1.2

## 2019-09-25 MED ORDER — ACETAMINOPHEN 500 MG PO TABS
1000.0000 mg | ORAL_TABLET | Freq: Once | ORAL | Status: AC
  Administered 2019-09-25: 1000 mg via ORAL
  Filled 2019-09-25: qty 2

## 2019-09-25 MED ORDER — CHLORHEXIDINE GLUCONATE 4 % EX LIQD: 60.0000 mL | Freq: Once | CUTANEOUS | Status: DC

## 2019-09-25 MED ORDER — LACTATED RINGERS IV SOLN: INTRAVENOUS | Status: DC

## 2019-09-25 MED ORDER — PHENOL 1.4 % MT LIQD: 1.0000 | OROMUCOSAL | Status: DC | PRN

## 2019-09-25 MED ORDER — PHENYLEPHRINE HCL-NACL 10-0.9 MG/250ML-% IV SOLN
INTRAVENOUS | Status: DC | PRN
  Administered 2019-09-25: 20 ug/min via INTRAVENOUS

## 2019-09-25 MED ORDER — FENTANYL CITRATE (PF) 100 MCG/2ML IJ SOLN: 50.0000 ug | Freq: Once | INTRAMUSCULAR | Status: AC

## 2019-09-25 MED ORDER — OXYCODONE HCL 5 MG PO TABS
5.0000 mg | ORAL_TABLET | ORAL | Status: DC | PRN
  Administered 2019-09-25: 5 mg via ORAL
  Administered 2019-09-25 – 2019-10-06 (×26): 10 mg via ORAL
  Filled 2019-09-25 (×27): qty 2

## 2019-09-25 MED ORDER — DIPHENHYDRAMINE HCL 50 MG/ML IJ SOLN
INTRAMUSCULAR | Status: DC | PRN
  Administered 2019-09-25: 12.5 mg via INTRAVENOUS

## 2019-09-25 MED ORDER — FLEET ENEMA 7-19 GM/118ML RE ENEM: 1.0000 | ENEMA | Freq: Once | RECTAL | Status: DC | PRN

## 2019-09-25 MED ORDER — ONDANSETRON HCL 4 MG/2ML IJ SOLN
INTRAMUSCULAR | Status: AC
  Filled 2019-09-25: qty 2

## 2019-09-25 MED ORDER — ROSUVASTATIN CALCIUM 20 MG PO TABS
40.0000 mg | ORAL_TABLET | Freq: Every day | ORAL | Status: DC
  Administered 2019-09-25 – 2019-10-06 (×12): 40 mg via ORAL
  Filled 2019-09-25 (×12): qty 2

## 2019-09-25 MED ORDER — SODIUM CHLORIDE 0.9 % IV SOLN: INTRAVENOUS | Status: DC

## 2019-09-25 MED ORDER — MAGNESIUM SULFATE 2 GM/50ML IV SOLN: 2.0000 g | Freq: Every day | INTRAVENOUS | Status: DC | PRN

## 2019-09-25 MED ORDER — PROPOFOL 10 MG/ML IV BOLUS
INTRAVENOUS | Status: DC | PRN
  Administered 2019-09-25: 100 mg via INTRAVENOUS

## 2019-09-25 MED ORDER — LISINOPRIL 10 MG PO TABS
10.0000 mg | ORAL_TABLET | Freq: Every day | ORAL | Status: DC
  Administered 2019-09-25 – 2019-10-06 (×12): 10 mg via ORAL
  Filled 2019-09-25 (×13): qty 1

## 2019-09-25 MED ORDER — HEPARIN SODIUM (PORCINE) 1000 UNIT/ML IJ SOLN
INTRAMUSCULAR | Status: DC | PRN
  Administered 2019-09-25: 5000 [IU] via INTRAVENOUS

## 2019-09-25 MED ORDER — FENTANYL CITRATE (PF) 100 MCG/2ML IJ SOLN
INTRAMUSCULAR | Status: DC | PRN
  Administered 2019-09-25 (×4): 50 ug via INTRAVENOUS

## 2019-09-25 MED ORDER — MIDAZOLAM HCL 5 MG/5ML IJ SOLN
INTRAMUSCULAR | Status: DC | PRN
  Administered 2019-09-25: 1 mg via INTRAVENOUS

## 2019-09-25 MED ORDER — ONDANSETRON HCL 4 MG/2ML IJ SOLN
INTRAMUSCULAR | Status: DC | PRN
  Administered 2019-09-25: 4 mg via INTRAVENOUS

## 2019-09-25 MED ORDER — METOPROLOL TARTRATE 12.5 MG HALF TABLET
12.5000 mg | ORAL_TABLET | Freq: Two times a day (BID) | ORAL | Status: DC
  Administered 2019-09-25 – 2019-10-01 (×12): 12.5 mg via ORAL
  Filled 2019-09-25 (×12): qty 1

## 2019-09-25 MED ORDER — APIXABAN 5 MG PO TABS
5.0000 mg | ORAL_TABLET | Freq: Two times a day (BID) | ORAL | Status: DC
  Administered 2019-09-26 – 2019-09-27 (×4): 5 mg via ORAL
  Filled 2019-09-25 (×4): qty 1

## 2019-09-25 MED ORDER — ONDANSETRON HCL 4 MG/2ML IJ SOLN
4.0000 mg | Freq: Four times a day (QID) | INTRAMUSCULAR | Status: DC | PRN
  Administered 2019-09-27: 4 mg via INTRAVENOUS
  Filled 2019-09-25: qty 2

## 2019-09-25 MED ORDER — SODIUM CHLORIDE 0.9 % IV SOLN: 500.0000 mL | Freq: Once | INTRAVENOUS | Status: DC | PRN

## 2019-09-25 MED ORDER — PROTAMINE SULFATE 10 MG/ML IV SOLN
INTRAVENOUS | Status: AC
  Filled 2019-09-25: qty 5

## 2019-09-25 MED ORDER — MIDAZOLAM HCL 2 MG/2ML IJ SOLN
INTRAMUSCULAR | Status: AC
  Filled 2019-09-25: qty 2

## 2019-09-25 MED ORDER — OXYCODONE HCL 5 MG PO TABS
ORAL_TABLET | ORAL | Status: AC
  Filled 2019-09-25: qty 1

## 2019-09-25 MED ORDER — FENTANYL CITRATE (PF) 100 MCG/2ML IJ SOLN
INTRAMUSCULAR | Status: AC
  Filled 2019-09-25: qty 2

## 2019-09-25 MED ORDER — DOCUSATE SODIUM 100 MG PO CAPS
100.0000 mg | ORAL_CAPSULE | Freq: Every day | ORAL | Status: DC
  Administered 2019-09-26 – 2019-10-06 (×10): 100 mg via ORAL
  Filled 2019-09-25 (×10): qty 1

## 2019-09-25 MED ORDER — PROTAMINE SULFATE 10 MG/ML IV SOLN
INTRAVENOUS | Status: DC | PRN
  Administered 2019-09-25: 50 mg via INTRAVENOUS
  Administered 2019-09-25: 10 mg via INTRAVENOUS
  Administered 2019-09-25: 40 mg via INTRAVENOUS

## 2019-09-25 MED ORDER — GUAIFENESIN-DM 100-10 MG/5ML PO SYRP: 15.0000 mL | ORAL_SOLUTION | ORAL | Status: DC | PRN

## 2019-09-25 MED ORDER — PROPOFOL 10 MG/ML IV BOLUS
INTRAVENOUS | Status: AC
  Filled 2019-09-25: qty 20

## 2019-09-25 MED ORDER — EPHEDRINE SULFATE 50 MG/ML IJ SOLN
INTRAMUSCULAR | Status: DC | PRN
  Administered 2019-09-25 (×5): 10 mg via INTRAVENOUS

## 2019-09-25 MED ORDER — CEFAZOLIN SODIUM-DEXTROSE 2-4 GM/100ML-% IV SOLN
2.0000 g | Freq: Three times a day (TID) | INTRAVENOUS | Status: AC
  Administered 2019-09-25 – 2019-09-26 (×2): 2 g via INTRAVENOUS
  Filled 2019-09-25 (×2): qty 100

## 2019-09-25 MED ORDER — HEPARIN SODIUM (PORCINE) 1000 UNIT/ML IJ SOLN
INTRAMUSCULAR | Status: AC
  Filled 2019-09-25: qty 1

## 2019-09-25 MED ORDER — PANTOPRAZOLE SODIUM 40 MG PO TBEC
40.0000 mg | DELAYED_RELEASE_TABLET | Freq: Every day | ORAL | Status: DC
  Administered 2019-09-25 – 2019-10-06 (×12): 40 mg via ORAL
  Filled 2019-09-25 (×12): qty 1

## 2019-09-25 MED ORDER — 0.9 % SODIUM CHLORIDE (POUR BTL) OPTIME
TOPICAL | Status: DC | PRN
  Administered 2019-09-25: 1000 mL

## 2019-09-25 MED ORDER — BISACODYL 5 MG PO TBEC
5.0000 mg | DELAYED_RELEASE_TABLET | Freq: Every day | ORAL | Status: DC | PRN
  Administered 2019-10-05: 5 mg via ORAL
  Filled 2019-09-25: qty 1

## 2019-09-25 MED ORDER — HEPARIN SODIUM (PORCINE) 5000 UNIT/ML IJ SOLN: 5000.0000 [IU] | Freq: Three times a day (TID) | INTRAMUSCULAR | Status: DC

## 2019-09-25 MED ORDER — DULOXETINE HCL 30 MG PO CPEP
30.0000 mg | ORAL_CAPSULE | Freq: Every day | ORAL | Status: DC
  Administered 2019-09-25 – 2019-10-06 (×12): 30 mg via ORAL
  Filled 2019-09-25 (×12): qty 1

## 2019-09-25 MED ORDER — LABETALOL HCL 5 MG/ML IV SOLN
10.0000 mg | INTRAVENOUS | Status: DC | PRN
  Administered 2019-09-28: 10 mg via INTRAVENOUS
  Filled 2019-09-25 (×2): qty 4

## 2019-09-25 MED ORDER — FENTANYL CITRATE (PF) 100 MCG/2ML IJ SOLN
25.0000 ug | INTRAMUSCULAR | Status: DC | PRN
  Administered 2019-09-25: 50 ug via INTRAVENOUS
  Administered 2019-09-25 (×3): 25 ug via INTRAVENOUS

## 2019-09-25 MED ORDER — FENTANYL CITRATE (PF) 100 MCG/2ML IJ SOLN
INTRAMUSCULAR | Status: AC
  Administered 2019-09-25: 50 ug via INTRAVENOUS
  Filled 2019-09-25: qty 2

## 2019-09-25 MED ORDER — NITROGLYCERIN 0.4 MG SL SUBL: 0.4000 mg | SUBLINGUAL_TABLET | SUBLINGUAL | Status: DC | PRN

## 2019-09-25 MED ORDER — ALUM & MAG HYDROXIDE-SIMETH 200-200-20 MG/5ML PO SUSP: 15.0000 mL | ORAL | Status: DC | PRN

## 2019-09-25 MED ORDER — LIDOCAINE 2% (20 MG/ML) 5 ML SYRINGE
INTRAMUSCULAR | Status: DC | PRN
  Administered 2019-09-25: 60 mg via INTRAVENOUS

## 2019-09-25 MED ORDER — HYDRALAZINE HCL 20 MG/ML IJ SOLN
5.0000 mg | INTRAMUSCULAR | Status: DC | PRN
  Filled 2019-09-25: qty 1

## 2019-09-25 MED ORDER — POTASSIUM CHLORIDE CRYS ER 20 MEQ PO TBCR: 20.0000 meq | EXTENDED_RELEASE_TABLET | Freq: Every day | ORAL | Status: DC | PRN

## 2019-09-25 MED ORDER — SENNOSIDES-DOCUSATE SODIUM 8.6-50 MG PO TABS
1.0000 | ORAL_TABLET | Freq: Every evening | ORAL | Status: DC | PRN
  Administered 2019-10-05: 1 via ORAL
  Filled 2019-09-25: qty 1

## 2019-09-25 SURGICAL SUPPLY — 61 items
BANDAGE ESMARK 6X9 LF (GAUZE/BANDAGES/DRESSINGS) IMPLANT
BNDG ESMARK 6X9 LF (GAUZE/BANDAGES/DRESSINGS)
CANISTER SUCT 3000ML PPV (MISCELLANEOUS) ×4 IMPLANT
CANNULA VESSEL 3MM 2 BLNT TIP (CANNULA) ×8 IMPLANT
CATH EMB LATEX FREE 3FRX80CM (CATHETERS) ×2
CATH EMB LATEX FREE 4FRX80CM (CATHETERS) ×2
CATH EMB LF 3FRX80 (CATHETERS) ×2 IMPLANT
CATH EMB LF 4FRX80 (CATHETERS) ×2 IMPLANT
CLIP LIGATING EXTRA MED SLVR (CLIP) ×4 IMPLANT
CLIP LIGATING EXTRA SM BLUE (MISCELLANEOUS) ×4 IMPLANT
COVER WAND RF STERILE (DRAPES) IMPLANT
CUFF TOURN SGL QUICK 34 (TOURNIQUET CUFF)
CUFF TOURN SGL QUICK 42 (TOURNIQUET CUFF) IMPLANT
CUFF TRNQT CYL 34X4.125X (TOURNIQUET CUFF) IMPLANT
DERMABOND ADVANCED (GAUZE/BANDAGES/DRESSINGS) ×4
DERMABOND ADVANCED .7 DNX12 (GAUZE/BANDAGES/DRESSINGS) ×4 IMPLANT
DRAIN SNY 10X20 3/4 PERF (WOUND CARE) IMPLANT
DRAPE HALF SHEET 40X57 (DRAPES) IMPLANT
DRAPE X-RAY CASS 24X20 (DRAPES) IMPLANT
ELECT REM PT RETURN 9FT ADLT (ELECTROSURGICAL) ×4
ELECTRODE REM PT RTRN 9FT ADLT (ELECTROSURGICAL) ×2 IMPLANT
EVACUATOR SILICONE 100CC (DRAIN) IMPLANT
GAUZE SPONGE 4X4 16PLY XRAY LF (GAUZE/BANDAGES/DRESSINGS) ×4 IMPLANT
GLOVE BIOGEL PI IND STRL 6.5 (GLOVE) ×4 IMPLANT
GLOVE BIOGEL PI IND STRL 7.0 (GLOVE) ×4 IMPLANT
GLOVE BIOGEL PI INDICATOR 6.5 (GLOVE) ×4
GLOVE BIOGEL PI INDICATOR 7.0 (GLOVE) ×4
GLOVE SKINSENSE NS SZ6.5 (GLOVE) ×12
GLOVE SKINSENSE NS SZ7.0 (GLOVE) ×4
GLOVE SKINSENSE NS SZ7.5 (GLOVE) ×2
GLOVE SKINSENSE STRL SZ6.5 (GLOVE) ×12 IMPLANT
GLOVE SKINSENSE STRL SZ7.0 (GLOVE) ×4 IMPLANT
GLOVE SKINSENSE STRL SZ7.5 (GLOVE) ×2 IMPLANT
GLOVE SS BIOGEL STRL SZ 7.5 (GLOVE) IMPLANT
GLOVE SUPERSENSE BIOGEL SZ 7.5 (GLOVE)
GOWN STRL REUS W/ TWL LRG LVL3 (GOWN DISPOSABLE) ×10 IMPLANT
GOWN STRL REUS W/TWL LRG LVL3 (GOWN DISPOSABLE) ×10
GRAFT PROPATEN W/RING 6X80X60 (Vascular Products) ×4 IMPLANT
INSERT FOGARTY SM (MISCELLANEOUS) IMPLANT
KIT BASIN OR (CUSTOM PROCEDURE TRAY) ×4 IMPLANT
KIT TURNOVER KIT B (KITS) ×4 IMPLANT
NS IRRIG 1000ML POUR BTL (IV SOLUTION) ×8 IMPLANT
PACK PERIPHERAL VASCULAR (CUSTOM PROCEDURE TRAY) ×4 IMPLANT
PAD ARMBOARD 7.5X6 YLW CONV (MISCELLANEOUS) ×8 IMPLANT
PADDING CAST COTTON 6X4 STRL (CAST SUPPLIES) IMPLANT
SET COLLECT BLD 21X3/4 12 (NEEDLE) IMPLANT
STOPCOCK 4 WAY LG BORE MALE ST (IV SETS) IMPLANT
SUT ETHILON 3 0 PS 1 (SUTURE) IMPLANT
SUT PROLENE 5 0 C 1 24 (SUTURE) ×8 IMPLANT
SUT PROLENE 6 0 CC (SUTURE) ×8 IMPLANT
SUT SILK 2 0 SH (SUTURE) ×4 IMPLANT
SUT VIC AB 2-0 CT1 27 (SUTURE) ×4
SUT VIC AB 2-0 CT1 TAPERPNT 27 (SUTURE) ×4 IMPLANT
SUT VIC AB 2-0 CTX 36 (SUTURE) ×8 IMPLANT
SUT VIC AB 3-0 SH 27 (SUTURE) ×4
SUT VIC AB 3-0 SH 27X BRD (SUTURE) ×4 IMPLANT
TOWEL GREEN STERILE (TOWEL DISPOSABLE) ×4 IMPLANT
TRAY FOLEY MTR SLVR 16FR STAT (SET/KITS/TRAYS/PACK) ×4 IMPLANT
TUBING EXTENTION W/L.L. (IV SETS) IMPLANT
UNDERPAD 30X30 (UNDERPADS AND DIAPERS) ×4 IMPLANT
WATER STERILE IRR 1000ML POUR (IV SOLUTION) ×4 IMPLANT

## 2019-09-25 NOTE — Progress Notes (Signed)
Pt received from PACU. Oriented to room and call bell. CHG wipes applied. VSS. CCMD called. Pt denies needs at this time.  Hazle Nordmann, RN

## 2019-09-25 NOTE — Interval H&P Note (Signed)
History and Physical Interval Note:  09/25/2019 10:54 AM  Mercedes [name]  has presented today for surgery, with the diagnosis of OCCULSION OF LEFT FEMORAL TO POPLITEAL ARTERY BYPASS GRAFT.  The various methods of treatment have been discussed with the patient and family. After consideration of risks, benefits and other options for treatment, the patient has consented to  Procedure(s): THROMBECTOMY FEMORAL TO POPLITEAL ARTERY BYPASS GRAFT LEFT LEG (Left) as a surgical intervention.  The patient's history has been reviewed, patient examined, no change in status, stable for surgery.  I have reviewed the patient's chart and labs.  Questions were answered to the patient's satisfaction.     Gretta Began

## 2019-09-25 NOTE — Progress Notes (Signed)
Dr. Leslye Peer, MD (Anesthesia) notified of BP 190/88 and pain 10/10 in LLE.  New order received for Fentanyl IVP x 1.

## 2019-09-25 NOTE — Anesthesia Postprocedure Evaluation (Signed)
Anesthesia Post Note  Patient: LORRY FURBER  Procedure(s) Performed: THROMBECTOMY and PROPATEN BYPASS  FEMORAL TO BELOW KNEE POPLITEAL ARTERY BYPASS GRAFT LEFT LEG (Left Leg Upper)     Patient location during evaluation: PACU Anesthesia Type: General Level of consciousness: awake and alert Pain management: pain level controlled Vital Signs Assessment: post-procedure vital signs reviewed and stable Respiratory status: spontaneous breathing, nonlabored ventilation, respiratory function stable and patient connected to nasal cannula oxygen Cardiovascular status: blood pressure returned to baseline and stable Postop Assessment: no apparent nausea or vomiting Anesthetic complications: no    Last Vitals:  Vitals:   09/25/19 1440 09/25/19 1445  BP:  137/66  Pulse: 63 66  Resp: 13 14  Temp:    SpO2: 100% 100%    Last Pain:  Vitals:   09/25/19 1500  TempSrc:   PainSc: 7                  Asami Lambright,W. EDMOND

## 2019-09-25 NOTE — Transfer of Care (Signed)
Immediate Anesthesia Transfer of Care Note  Patient: Mercedes [name]  Procedure(s) Performed: THROMBECTOMY and PROPATEN BYPASS  FEMORAL TO BELOW KNEE POPLITEAL ARTERY BYPASS GRAFT LEFT LEG (Left Leg Upper)  Patient Location: PACU  Anesthesia Type:General  Level of Consciousness: drowsy and patient cooperative  Airway & Oxygen Therapy: Patient Spontanous Breathing  Post-op Assessment: Report given to RN and Post -op Vital signs reviewed and stable  Post vital signs: Reviewed and stable  Last Vitals:  Vitals Value Taken Time  BP 157/77 09/25/19 1403  Temp    Pulse 78 09/25/19 1405  Resp 12 09/25/19 1405  SpO2 96 % 09/25/19 1405  Vitals shown include unvalidated device data.  Last Pain:  Vitals:   09/25/19 0820  TempSrc:   PainSc: 10-Worst pain ever         Complications: No apparent anesthesia complications

## 2019-09-25 NOTE — Op Note (Signed)
OPERATIVE REPORT  DATE OF SURGERY: 09/25/2019  PATIENT: Mercedes [name], 64 y.o. female MRN: [id number]  DOB: [birth year]  PRE-OPERATIVE DIAGNOSIS: Critical limb ischemia left lower extremity  POST-OPERATIVE DIAGNOSIS:  Same  PROCEDURE: Attempted thrombectomy of left femoral to below-knee popliteal vein bypass.  New left femoral to below-knee popliteal 6 mm ringed Gore-Tex graft  SURGEON:  Gretta Began, M.D.  PHYSICIAN ASSISTANT: Nurse  ANESTHESIA: General  EBL: per anesthesia record  Total I/O In: 1200 [I.V.:1100; IV Piggyback:100] Out: 240 [Urine:190; Blood:50]  BLOOD ADMINISTERED: none  DRAINS: none  SPECIMEN: none  COUNTS CORRECT:  YES  PATIENT DISPOSITION:  PACU - hemodynamically stable  PROCEDURE DETAILS: Patient had presented to the office 2 days ago with recurrent ischemia of her lower extremity.  She has a complex past history.  She has gone undergone prior left femoral to below-knee popliteal bypass with vein with Dr. Darrick Penna in 2015.  She had presented in September with critical ischemia and gangrene of her foot.  She underwent thrombectomy and proximal revision and eventually underwent transmetatarsal amputation.  She has had persistent pain and signs of persistent progressive ischemia.  On evaluation in the office she clearly has an occluded graft.  I explained to her as had been explained by Dr. Darrick Penna in September that she is extremely high risk for amputation.  She wishes for attempts at revascularization.  I explained that with her known severe outflow disease that this has a very low chance of success.  She was taken the operating placed in supine position.  The left groin and left leg prepped draped you sterile fashion.  Incision was made through the prior groin incision and carried down to isolate the common femoral artery at the inguinal ligament.  Also the superficial femoral and profunda femoris arteries and the vein graft.  Patient was given 5000 units  (heparin after extrication time the common femoral artery was occluded as was the profundus femoris artery with a Henley clamp.  The hood of the prior placed cadaver vein patch was opened longitudinally.  There was old adherent thrombus present.  This was removed.  Attempts were made at thrombectomizing the vein bypass.  This had old clot present with a great deal of sclerosis.  A 2 dilator would not pass through the vein graft.  Felt that there was no chance for thrombectomy of this.  Separate incision was then made at the medial approach of the below-knee popliteal artery.  The old vein graft was exposed at this level.  The below-knee popliteal artery is extremely small.  The popliteal artery was exposed proximal distal to the vein bypass.  The vein was transected and was opened onto the hood of the distal anastomosis.  A 3 dilator could barely pass through the distal popliteal artery.  A 2 dilator passed with some resistance through the distal popliteal artery.  There is no evidence of clot distally.  This was flushed with heparinized occluded.  A tunnel was created from the level of the below-knee popliteal artery to the groin and a 6 mm ringed propatent Gore-Tex graft was brought to the tunnel.  The graft was spatulated and sewn end-to-side to the below-knee popliteal artery using a prior existing bypass as a vein cuff.  This was with a 6-0 Prolene suture.  Next the graft was cut to the appropriate length at the time the old bypass was taken down but as was some of the prior cadaveric vein patch.  The Gore-Tex graft was  spatulated and sewn end-to-side to the vein with a running 6-0 Prolene suture.  The usual flushing maneuvers were undertaken anastomosis completed.  Flow was restored to the bypass.  Patient had flow in the below-knee popliteal artery which was graft dependent.  No flow was audible in the foot.  Patient was given 100 mg protamine reverse heparin.  Wounds irrigated with saline.  Hemostasis  electrocautery.  The wounds were closed with 2-0 Vicryl and the fascia and 3-0 Vicryl in the subcuticular tissue.  Sterile dressing was applied the patient transferred to the recovery in stable condition   Mercedes [name], M.D., [facility] 09/25/2019 2:28 PM

## 2019-09-25 NOTE — Progress Notes (Signed)
   Left TMA with ankle motor intact, no doppler signals.  Modeled appearance to foot.  Cool to touch.  Left groin incision without hematoma, leg incision intact.  S/P right fem to below knee bypass with 6 mm ringed Gore-Tex graft Watchful waiting.  If she develops uncontrolled pain or frank ischemic changes she will need further surgery BKA verses AKA.   Mosetta Pigeon PA-C

## 2019-09-26 ENCOUNTER — Inpatient Hospital Stay (HOSPITAL_COMMUNITY): Payer: Medicare Other

## 2019-09-26 ENCOUNTER — Encounter: Payer: Self-pay | Admitting: *Deleted

## 2019-09-26 DIAGNOSIS — I739 Peripheral vascular disease, unspecified: Secondary | ICD-10-CM

## 2019-09-26 LAB — CBC
HCT: 34.4 % — ABNORMAL LOW (ref 36.0–46.0)
Hemoglobin: 10.5 g/dL — ABNORMAL LOW (ref 12.0–15.0)
MCH: 24.3 pg — ABNORMAL LOW (ref 26.0–34.0)
MCHC: 30.5 g/dL (ref 30.0–36.0)
MCV: 79.6 fL — ABNORMAL LOW (ref 80.0–100.0)
Platelets: 361 10*3/uL (ref 150–400)
RBC: 4.32 MIL/uL (ref 3.87–5.11)
RDW: 15.8 % — ABNORMAL HIGH (ref 11.5–15.5)
WBC: 8.7 10*3/uL (ref 4.0–10.5)
nRBC: 0 % (ref 0.0–0.2)

## 2019-09-26 LAB — BASIC METABOLIC PANEL
Anion gap: 9 (ref 5–15)
BUN: 10 mg/dL (ref 8–23)
CO2: 23 mmol/L (ref 22–32)
Calcium: 8.6 mg/dL — ABNORMAL LOW (ref 8.9–10.3)
Chloride: 102 mmol/L (ref 98–111)
Creatinine, Ser: 0.79 mg/dL (ref 0.44–1.00)
GFR calc Af Amer: >60 mL/min (ref >60)
GFR calc non Af Amer: >60 mL/min (ref >60)
Glucose, Bld: 120 mg/dL — ABNORMAL HIGH (ref 70–99)
Potassium: 4.1 mmol/L (ref 3.5–5.1)
Sodium: 134 mmol/L — ABNORMAL LOW (ref 135–145)

## 2019-09-26 NOTE — Evaluation (Signed)
Physical Therapy Evaluation Patient Details Name: Mercedes [name] MRN: [id number] DOB: 15-Aug-1956 Today's Date: 09/26/2019   History of Present Illness  Patient is a 64 y/o female who presents s/p New left femoral to below-knee popliteal 6 mm ringed Gore-Tex graft after a failed thrombectomy 1/28. PMH includes left transmetatarsal amputation, RA, HTN, HLD, Afib, Takotsubo cardiomyopathy, PAD, depression.  Clinical Impression  Patient presents with LLE pain, limited knee AROM, impaired balance and impaired mobility s/p above. Pt reports being Mod I PTA using RW and mainly hopping due to pain with left foot. Pt lives alone and has been doing the best she can, per report. Today, tolerated bed mobility, transfers and gait training with Min guard assist for balance/safety. Pt eager to return home with follow up HHPT. Encouraged walking to bathroom with nursing. Instructed pt in there ex and stretches to perform with LLE to improve AROM/mobility/strength. Will follow acutely to maximize independence and mobility prior to return home.    Follow Up Recommendations Home health PT;Supervision - Intermittent    Equipment Recommendations  None recommended by PT    Recommendations for Other Services       Precautions / Restrictions Precautions Precautions: Fall Restrictions Weight Bearing Restrictions: No      Mobility  Bed Mobility Overal bed mobility: Needs Assistance Bed Mobility: Supine to Sit     Supine to sit: Supervision;HOB elevated     General bed mobility comments: Increased time and use of rail but no assist needed.  Transfers Overall transfer level: Needs assistance Equipment used: Rolling walker (2 wheeled) Transfers: Sit to/from Stand Sit to Stand: Min guard         General transfer comment: Min guard for safety. Pt likes to pull up on RW to stand. Transferred to chair post ambulation.  Ambulation/Gait Ambulation/Gait assistance: Min guard Gait Distance (Feet): 18  Feet Assistive device: Rolling walker (2 wheeled) Gait Pattern/deviations: Step-to pattern;Trunk flexed("hop to") Gait velocity: decreased   General Gait Details: Hop to gait pattern with difficulty extending LLE, reluctant to place weight through it. Lightheadedness. BP stable.  Stairs            Wheelchair Mobility    Modified Rankin (Stroke Patients Only)       Balance Overall balance assessment: Needs assistance Sitting-balance support: Feet supported;No upper extremity supported Sitting balance-Leahy Scale: Good Sitting balance - Comments: Able to reach outside BoS and donn socks without difficulty.   Standing balance support: During functional activity Standing balance-Leahy Scale: Poor Standing balance comment: Requires UE support heavily due to elective NWB status LLE                             Pertinent Vitals/Pain Pain Assessment: 0-10 Pain Score: 5  Pain Location: LLE and foot Pain Descriptors / Indicators: Sore;Aching;Guarding Pain Intervention(s): Repositioned;Monitored during session;Limited activity within patient's tolerance    Home Living Family/patient expects to be discharged to:: Private residence Living Arrangements: Alone Available Help at Discharge: Family;Available PRN/intermittently Type of Home: House Home Access: Stairs to enter Entrance Stairs-Rails: None Entrance Stairs-Number of Steps: 2 Home Layout: One level Home Equipment: Walker - 2 wheels;Cane - single point;Tub bench;Bedside commode;Hand held shower head;Adaptive equipment;Wheelchair - manual      Prior Function Level of Independence: Independent with assistive device(s)         Comments: Uses RW for ambulation. Cares for self. Drives.     Hand Dominance   Dominant Hand: Right  Extremity/Trunk Assessment   Upper Extremity Assessment Upper Extremity Assessment: Defer to OT evaluation    Lower Extremity Assessment Lower Extremity Assessment: LLE  deficits/detail LLE Deficits / Details: Limited knee extension and tenderness to left foot. LLE Sensation: decreased light touch       Communication   Communication: No difficulties  Cognition Arousal/Alertness: Awake/alert Behavior During Therapy: WFL for tasks assessed/performed Overall Cognitive Status: Within Functional Limits for tasks assessed                                        General Comments General comments (skin integrity, edema, etc.): BP soft but stable.    Exercises General Exercises - Lower Extremity Quad Sets: Left;5 reps;Seated Other Exercises Other Exercises: Instructed pt in hamstring/heel cord stretch, 30 second hold   Assessment/Plan    PT Assessment Patient needs continued PT services  PT Problem List Decreased strength;Decreased mobility;Pain;Impaired sensation;Decreased balance;Decreased skin integrity;Decreased range of motion       PT Treatment Interventions Therapeutic activities;Gait training;Therapeutic exercise;Patient/family education;Functional mobility training;Stair training;Balance training    PT Goals (Current goals can be found in the Care Plan section)  Acute Rehab PT Goals Patient Stated Goal: to go home PT Goal Formulation: With patient Time For Goal Achievement: 10/10/19 Potential to Achieve Goals: Good    Frequency Min 3X/week   Barriers to discharge Decreased caregiver support;Inaccessible home environment lives alone and stairs to enter    Co-evaluation               AM-PAC PT "6 Clicks" Mobility  Outcome Measure Help needed turning from your back to your side while in a flat bed without using bedrails?: None Help needed moving from lying on your back to sitting on the side of a flat bed without using bedrails?: A Little Help needed moving to and from a bed to a chair (including a wheelchair)?: A Little Help needed standing up from a chair using your arms (e.g., wheelchair or bedside chair)?: A  Little Help needed to walk in hospital room?: A Little Help needed climbing 3-5 steps with a railing? : A Little 6 Click Score: 19    End of Session Equipment Utilized During Treatment: Gait belt Activity Tolerance: Patient tolerated treatment well Patient left: in chair;with call bell/phone within reach Nurse Communication: Mobility status PT Visit Diagnosis: Pain;Difficulty in walking, not elsewhere classified (R26.2) Pain - Right/Left: Left Pain - part of body: Leg;Ankle and joints of foot    Time: [phone] PT Time Calculation (min) (ACUTE ONLY): 20 min   Charges:   PT Evaluation $PT Eval Moderate Complexity: 1 Mod PT Treatments $Therapeutic Activity: 8-22 mins        Marisa Severin, PT, DPT Acute Rehabilitation Services Pager [phone] Office [phone]      Marguarite Arbour A [place] 09/26/2019, 10:29 AM

## 2019-09-26 NOTE — Progress Notes (Signed)
ABI exam completed.  Results given to [facility], Charity fundraiser.  Preliminary results can be found under CV proc under chart review.  09/26/2019 3:54 PM  Zakary Kimura, K., RDMS, RVT

## 2019-09-26 NOTE — Plan of Care (Signed)
Continue to monitor

## 2019-09-26 NOTE — Progress Notes (Addendum)
  Progress Note    09/26/2019 7:52 AM 1 Day Post-Op  Subjective:  Post op day 1 Attempted thrombectomy of left femoral to below-knee popliteal vein bypass.  New left femoral to below-knee popliteal 6 mm ringed Gore-Tex graft. She is having pain most greatly behind her left knee controlled presently with IV pain medication. Otherwise doing well with no other complaints   Vitals:   09/26/19 0002 09/26/19 0531  BP: 119/69 128/68  Pulse: 77 64  Resp: 17 12  Temp: 98.1 F (36.7 C) 98.4 F (36.9 C)  SpO2: 97% 97%   Physical Exam Lungs:  nonlabored Incisions: Left groin incision, clean dry and intact. Minimal tenderness. No swelling, ecchymosis, erythema. Left popliteal incision clean, dry and intact. Minimal tenderness. No swelling, ecchymosis or erythema Abdomen:  nondistended Extremities: Left femoral pulse 2+, left popliteal and distal pulses not palpable. Left TMA intact. No doppler signals. Cool to touch  CBC    Component Value Date/Time   WBC 8.7 09/26/2019 0313   RBC 4.32 09/26/2019 0313   HGB 10.5 (L) 09/26/2019 0313   HGB 14.2 10/09/2018 1125   HCT 34.4 (L) 09/26/2019 0313   HCT 44.6 10/09/2018 1125   PLT 361 09/26/2019 0313   PLT 289 10/09/2018 1125   MCV 79.6 (L) 09/26/2019 0313   MCV 79 10/09/2018 1125   MCH 24.3 (L) 09/26/2019 0313   MCHC 30.5 09/26/2019 0313   RDW 15.8 (H) 09/26/2019 0313   RDW 14.8 10/09/2018 1125   LYMPHSABS 2.5 05/22/2019 1816   MONOABS 0.6 05/22/2019 1816   EOSABS 0.4 05/22/2019 1816   BASOSABS 0.1 05/22/2019 1816    BMET    Component Value Date/Time   NA 134 (L) 09/26/2019 0313   NA 135 10/09/2018 1125   K 4.1 09/26/2019 0313   CL 102 09/26/2019 0313   CO2 23 09/26/2019 0313   GLUCOSE 120 (H) 09/26/2019 0313   BUN 10 09/26/2019 0313   BUN 10 10/09/2018 1125   CREATININE 0.79 09/26/2019 0313   CREATININE 1.13 (H) 08/03/2016 1440   CALCIUM 8.6 (L) 09/26/2019 0313   GFRNONAA >60 09/26/2019 0313   GFRAA >60 09/26/2019 0313     INR    Component Value Date/Time   INR 1.0 09/25/2019 0757     Intake/Output Summary (Last 24 hours) at 09/26/2019 0752 Last data filed at 09/26/2019 0700 Gross per 24 hour  Intake 2184.43 ml  Output 365 ml  Net 1819.43 ml     Assessment/Plan:  64 y.o. female is s/p  Attempted thrombectomy of left femoral to below-knee popliteal vein bypass.  New left femoral to below-knee popliteal 6 mm ringed Gore-Tex graft 1 day post op. If she develops uncontrolled pain or frank ischemic changes she will likely need AKA. Options for AKA vs BKA were discussed with her. Will continue watchful waiting  DVT prophylaxis: Eliquis   Graceann Congress, PA-C [facility] [phone] 09/26/2019 7:52 AM   I have examined the patient, reviewed and agree with above.  Discussed no further options for revascularization or limb salvage.  She is having pain.  Will mobilize.  Understands that if she is not having adequate pain control will proceed with above-knee amputation.  Has occlusion of her profundus and therefore is not an option for above-knee attempt.  Gretta Began, MD 09/26/2019 1:59 PM

## 2019-09-26 NOTE — Discharge Instructions (Signed)

## 2019-09-26 NOTE — Progress Notes (Signed)
After receiving 10 mg Oxycodone at 2223 and 1 mg Dilaudid at 2355, the patient was still complaining of severe pain in her left leg behind her knee.  The area was not swollen or showed signs of a hematoma.  Paged the on call vascular surgeon.  He said to give an extra 0.5 mg of Dilaudid IV and start pt on a Dilaudid PCA pump.  The patient accepted the extra 0.5 mg of Dilaudid but refused to be put on a PCA for fear of addiction.  The pt is now sleeping comfortably in bed.  Will continue to monitor.  Harriet Masson, RN

## 2019-09-26 NOTE — Evaluation (Signed)
Occupational Therapy Evaluation Patient Details Name: Mercedes [name] MRN: [id number] DOB: December 21, 1955 Today's Date: 09/26/2019    History of Present Illness Patient is a 64 y/o female who presents s/p New left femoral to below-knee popliteal 6 mm ringed Gore-Tex graft after a failed thrombectomy 1/28. PMH includes left transmetatarsal amputation, RA, HTN, HLD, Afib, Takotsubo cardiomyopathy, PAD, depression.   Clinical Impression   Pt with decline in function and safety with ADLs and ADL mobility with impaired balance and endurance. Pt lives at home alone and is independent with ADLs/selfcare, home mgt, was driving and has a RW, cane and reacher. Pt currently requires min guard A with grooming standing, min A with LB ADLs, min guard A with toileting and min guard A with mobility using RW. Pt would benefit from acute OT services to address impairments to maximize level of function and safety    Follow Up Recommendations  Home health OT;Supervision - Intermittent    Equipment Recommendations  Other (comment)(LH bath sponge, sock aid)    Recommendations for Other Services       Precautions / Restrictions Precautions Precautions: Fall Restrictions Weight Bearing Restrictions: No      Mobility Bed Mobility Overal bed mobility: Needs Assistance Bed Mobility: Sit to Supine     Supine to sit: Supervision;HOB elevated Sit to supine: Min guard   General bed mobility comments: pt in recliner upon arrival. Assisted pt back to bed  Transfers Overall transfer level: Needs assistance Equipment used: Rolling walker (2 wheeled) Transfers: Sit to/from Stand Sit to Stand: Min guard         General transfer comment: Min guard for safety. Pt likes to pull up on RW to stand. Transferred to chair post ambulation.    Balance Overall balance assessment: Needs assistance Sitting-balance support: Feet supported;No upper extremity supported Sitting balance-Leahy Scale: Good Sitting  balance - Comments: Able to reach outside BoS and donn socks without difficulty.   Standing balance support: During functional activity Standing balance-Leahy Scale: Poor Standing balance comment: Requires UE support heavily due to elective NWB status LLE                           ADL either performed or assessed with clinical judgement   ADL Overall ADL's : Needs assistance/impaired Eating/Feeding: Independent;Sitting   Grooming: Wash/dry hands;Wash/dry face;Min guard;Standing   Upper Body Bathing: Set up;Independent;Sitting   Lower Body Bathing: Minimal assistance   Upper Body Dressing : Set up;Independent;Sitting   Lower Body Dressing: Minimal assistance   Toilet Transfer: Min guard;Ambulation;RW;BSC   Toileting- Water quality scientist and Hygiene: Min guard;Sit to/from stand       Functional mobility during ADLs: Min guard;Rolling walker General ADL Comments: initiated A/E education with pt, handout provided (LH bath sponge). pt has reacher at home     Vision Patient Visual Report: No change from baseline       Perception     Praxis      Pertinent Vitals/Pain Pain Assessment: 0-10 Pain Score: 6  Pain Location: LLE and foot Pain Descriptors / Indicators: Sore;Aching;Guarding Pain Intervention(s): Limited activity within patient's tolerance;Monitored during session;Repositioned;Patient requesting pain meds-RN notified     Hand Dominance Right   Extremity/Trunk Assessment Upper Extremity Assessment Upper Extremity Assessment: Generalized weakness   Lower Extremity Assessment Lower Extremity Assessment: Defer to PT evaluation LLE Deficits / Details: Limited knee extension and tenderness to left foot. LLE Sensation: decreased light touch       Communication  Communication Communication: No difficulties   Cognition Arousal/Alertness: Awake/alert Behavior During Therapy: WFL for tasks assessed/performed Overall Cognitive Status: Within  Functional Limits for tasks assessed                                     General Comments      Exercises    Shoulder Instructions      Home Living Family/patient expects to be discharged to:: Private residence Living Arrangements: Alone Available Help at Discharge: Family;Available PRN/intermittently Type of Home: House Home Access: Stairs to enter [employer] of Steps: 2 Entrance Stairs-Rails: None Home Layout: One level     Bathroom Shower/Tub: Chief Strategy Officer: Standard Bathroom Accessibility: Yes   Home Equipment: Environmental consultant - 2 wheels;Cane - single point;Tub bench;Bedside commode;Hand held shower head;Adaptive equipment;Wheelchair - Equities trader: Reacher        Prior Functioning/Environment Level of Independence: Independent with assistive device(s)        Comments: Uses RW for ambulation. Cares for self. Drives.        OT Problem List: Impaired balance (sitting and/or standing);Pain;Decreased activity tolerance;Decreased knowledge of use of DME or AE      OT Treatment/Interventions: Self-care/ADL training;DME and/or AE instruction;Therapeutic activities;Patient/family education    OT Goals(Current goals can be found in the care plan section) Acute Rehab OT Goals Patient Stated Goal: to go home OT Goal Formulation: With patient Time For Goal Achievement: 10/10/19 Potential to Achieve Goals: Good ADL Goals Pt Will Perform Grooming: with supervision;with set-up;with modified independence;standing Pt Will Perform Lower Body Bathing: with min guard assist;with supervision;with set-up;with adaptive equipment;sitting/lateral leans;sit to/from stand Pt Will Perform Lower Body Dressing: with min guard assist;with supervision;with set-up;with adaptive equipment;sitting/lateral leans;sit to/from stand Pt Will Transfer to Toilet: with supervision;with modified independence;ambulating Pt Will Perform Toileting -  Clothing Manipulation and hygiene: with supervision;with modified independence;sit to/from stand  OT Frequency: Min 2X/week   Barriers to D/C:    no barriers       Co-evaluation              AM-PAC OT "6 Clicks" Daily Activity     Outcome Measure Help from another person eating meals?: None Help from another person taking care of personal grooming?: A Little Help from another person toileting, which includes using toliet, bedpan, or urinal?: A Little Help from another person bathing (including washing, rinsing, drying)?: A Little Help from another person to put on and taking off regular upper body clothing?: None Help from another person to put on and taking off regular lower body clothing?: A Little 6 Click Score: 20   End of Session Equipment Utilized During Treatment: Gait belt;Rolling walker;Other (comment)(BSC)  Activity Tolerance: Patient tolerated treatment well Patient left: in bed;with call bell/phone within reach;with nursing/sitter in room  OT Visit Diagnosis: Other abnormalities of gait and mobility (R26.89);Pain Pain - Right/Left: Left Pain - part of body: Leg                Time: 7341-9379 OT Time Calculation (min): 26 min Charges:  OT General Charges $OT Visit: 1 Visit OT Evaluation $OT Eval Moderate Complexity: 1 Mod OT Treatments $Self Care/Home Management : 8-22 mins    Galen [place] 09/26/2019, 12:47 PM

## 2019-09-27 NOTE — Progress Notes (Signed)
Physical Therapy Treatment Patient Details Name: Mercedes [name] MRN: [id number] DOB: Dec 10, 1955 Today's Date: 09/27/2019    History of Present Illness Patient is a 64 y/o female who presents s/p New left femoral to below-knee popliteal 6 mm ringed Gore-Tex graft after a failed thrombectomy 1/28. PMH includes left transmetatarsal amputation, RA, HTN, HLD, Afib, Takotsubo cardiomyopathy, PAD, depression.    PT Comments    Pt limited tolerance this session due to LLE pain. Pt is able to maintain NWB through LLE for bed mobility, transfers, and short household ambulation. PT demonstrating and educating patient on HEP and exercises to initiate to better prepare for possible prosthetic use after amputation. Pt will benefit from further acute PT follow up after possible AKA on Monday per vascular notes.   Follow Up Recommendations  Home health PT;Supervision - Intermittent     Equipment Recommendations  None recommended by PT    Recommendations for Other Services       Precautions / Restrictions Precautions Precautions: Fall Restrictions Weight Bearing Restrictions: No    Mobility  Bed Mobility Overal bed mobility: Modified Independent             General bed mobility comments: increased time  Transfers Overall transfer level: Needs assistance Equipment used: Rolling walker (2 wheeled);None Transfers: Sit to/from Stand Sit to Stand: Supervision         General transfer comment: pt able to perform sit to stand with support of bed rail and with use of RW, while maintaining NWB LLE  Ambulation/Gait Ambulation/Gait assistance: Supervision Gait Distance (Feet): 20 Feet Assistive device: Rolling walker (2 wheeled) Gait Pattern/deviations: (hop-to gait) Gait velocity: decreased Gait velocity interpretation: <1.8 ft/sec, indicate of risk for recurrent falls General Gait Details: pt with steady hop to gait, stopping once due to RLE cramping   Stairs              Wheelchair Mobility    Modified Rankin (Stroke Patients Only)       Balance Overall balance assessment: Needs assistance Sitting-balance support: No upper extremity supported;Feet supported Sitting balance-Leahy Scale: Good Sitting balance - Comments: supervision   Standing balance support: Single extremity supported;During functional activity Standing balance-Leahy Scale: Good Standing balance comment: supervision with unilateral UE support of bed rail or RW                            Cognition Arousal/Alertness: Awake/alert Behavior During Therapy: WFL for tasks assessed/performed Overall Cognitive Status: Within Functional Limits for tasks assessed                                        Exercises General Exercises - Lower Extremity Ankle Circles/Pumps: AROM;Both;5 reps Gluteal Sets: AROM;Both;5 reps Straight Leg Raises: AROM;Both;5 reps Other Exercises Other Exercises: PT provides instruction for sidelying AROM hip extension to aide in stretching hip flexors and maintaining ROM in hopes of utilizing a prosthesis    General Comments General comments (skin integrity, edema, etc.): VSS      Pertinent Vitals/Pain Pain Assessment: Faces Faces Pain Scale: Hurts whole lot Pain Location: LLE and foot Pain Descriptors / Indicators: Sore;Aching;Guarding Pain Intervention(s): Limited activity within patient's tolerance    Home Living                      Prior Function  PT Goals (current goals can now be found in the care plan section) Acute Rehab PT Goals Patient Stated Goal: to go home Progress towards PT goals: Progressing toward goals    Frequency    Min 3X/week      PT Plan Current plan remains appropriate    Co-evaluation              AM-PAC PT "6 Clicks" Mobility   Outcome Measure  Help needed turning from your back to your side while in a flat bed without using bedrails?: None Help needed  moving from lying on your back to sitting on the side of a flat bed without using bedrails?: None Help needed moving to and from a bed to a chair (including a wheelchair)?: None Help needed standing up from a chair using your arms (e.g., wheelchair or bedside chair)?: None Help needed to walk in hospital room?: None Help needed climbing 3-5 steps with a railing? : A Lot 6 Click Score: 22    End of Session Equipment Utilized During Treatment: (none) Activity Tolerance: Patient tolerated treatment well Patient left: in bed;with call bell/phone within reach Nurse Communication: Mobility status PT Visit Diagnosis: Pain;Difficulty in walking, not elsewhere classified (R26.2) Pain - Right/Left: Left Pain - part of body: Leg;Ankle and joints of foot     Time: 6808-8110 PT Time Calculation (min) (ACUTE ONLY): 15 min  Charges:  $Gait Training: 8-22 mins                     Zenaida Niece, PT, DPT Acute Rehabilitation Pager: [phone]    Zenaida Niece 09/27/2019, 5:16 PM

## 2019-09-27 NOTE — Progress Notes (Addendum)
  Progress Note    09/27/2019 7:44 AM 2 Days Post-Op  Subjective:  Pain L foot   Vitals:   09/27/19 0021 09/27/19 0324  BP: 130/63 (!) 155/75  Pulse: 63 73  Resp: 12 16  Temp: 98.9 F (37.2 C) 98.5 F (36.9 C)  SpO2: 93% 96%   Physical Exam: Lungs:  Non labored Incisions:  L groin and popliteal incisions c/d/i Extremities:  No peripheral signals LLE, L foot cold to touch Neurologic: A&O  CBC    Component Value Date/Time   WBC 8.7 09/26/2019 0313   RBC 4.32 09/26/2019 0313   HGB 10.5 (L) 09/26/2019 0313   HGB 14.2 10/09/2018 1125   HCT 34.4 (L) 09/26/2019 0313   HCT 44.6 10/09/2018 1125   PLT 361 09/26/2019 0313   PLT 289 10/09/2018 1125   MCV 79.6 (L) 09/26/2019 0313   MCV 79 10/09/2018 1125   MCH 24.3 (L) 09/26/2019 0313   MCHC 30.5 09/26/2019 0313   RDW 15.8 (H) 09/26/2019 0313   RDW 14.8 10/09/2018 1125   LYMPHSABS 2.5 05/22/2019 1816   MONOABS 0.6 05/22/2019 1816   EOSABS 0.4 05/22/2019 1816   BASOSABS 0.1 05/22/2019 1816    BMET    Component Value Date/Time   NA 134 (L) 09/26/2019 0313   NA 135 10/09/2018 1125   K 4.1 09/26/2019 0313   CL 102 09/26/2019 0313   CO2 23 09/26/2019 0313   GLUCOSE 120 (H) 09/26/2019 0313   BUN 10 09/26/2019 0313   BUN 10 10/09/2018 1125   CREATININE 0.79 09/26/2019 0313   CREATININE 1.13 (H) 08/03/2016 1440   CALCIUM 8.6 (L) 09/26/2019 0313   GFRNONAA >60 09/26/2019 0313   GFRAA >60 09/26/2019 0313    INR    Component Value Date/Time   INR 1.0 09/25/2019 0757     Intake/Output Summary (Last 24 hours) at 09/27/2019 0744 Last data filed at 09/27/2019 0324 Gross per 24 hour  Intake 729.53 ml  Output 1125 ml  Net -395.47 ml     Assessment/Plan:  64 y.o. female is s/p redo L femoral to BK pop bypass which has since occluded  2 Days Post-Op   No further revascularization options Patient now agreeable to L AKA on Monday Pain control over the weekend   Emilie Rutter, PA-C [facility] [phone] 09/27/2019 7:44 AM   I have interviewed and examined patient with PA and agree with assessment and plan above.  Tentatively scheduled for left above-knee amputation with Dr. Darrick Penna on Monday.  Trellis Guirguis C. Randie Heinz, MD [facility] [place] Office: [phone] Pager: [phone]

## 2019-09-28 MED ORDER — CEFAZOLIN SODIUM-DEXTROSE 2-4 GM/100ML-% IV SOLN
2.0000 g | INTRAVENOUS | Status: AC
  Filled 2019-09-28 (×2): qty 100

## 2019-09-28 MED ORDER — HEPARIN SODIUM (PORCINE) 5000 UNIT/ML IJ SOLN
5000.0000 [IU] | Freq: Three times a day (TID) | INTRAMUSCULAR | Status: DC
  Administered 2019-09-28 – 2019-10-01 (×8): 5000 [IU] via SUBCUTANEOUS
  Filled 2019-09-28 (×9): qty 1

## 2019-09-28 NOTE — Progress Notes (Addendum)
  Progress Note    09/28/2019 9:11 AM 3 Days Post-Op  Subjective:  Worsening ischemic pain L foot.  Agreeable to AKA tomorrow   Vitals:   09/28/19 0423 09/28/19 0721  BP: 123/69 (!) 149/73  Pulse: 71 (!) 57  Resp: 16 14  Temp:  99.2 F (37.3 C)  SpO2: 93% 98%   Physical Exam: Lungs:  Non labored Incisions:  L groin and popliteal incisions c/d/i Extremities:  L foot cool to touch Neurologic: A&O  CBC    Component Value Date/Time   WBC 8.7 09/26/2019 0313   RBC 4.32 09/26/2019 0313   HGB 10.5 (L) 09/26/2019 0313   HGB 14.2 10/09/2018 1125   HCT 34.4 (L) 09/26/2019 0313   HCT 44.6 10/09/2018 1125   PLT 361 09/26/2019 0313   PLT 289 10/09/2018 1125   MCV 79.6 (L) 09/26/2019 0313   MCV 79 10/09/2018 1125   MCH 24.3 (L) 09/26/2019 0313   MCHC 30.5 09/26/2019 0313   RDW 15.8 (H) 09/26/2019 0313   RDW 14.8 10/09/2018 1125   LYMPHSABS 2.5 05/22/2019 1816   MONOABS 0.6 05/22/2019 1816   EOSABS 0.4 05/22/2019 1816   BASOSABS 0.1 05/22/2019 1816    BMET    Component Value Date/Time   NA 134 (L) 09/26/2019 0313   NA 135 10/09/2018 1125   K 4.1 09/26/2019 0313   CL 102 09/26/2019 0313   CO2 23 09/26/2019 0313   GLUCOSE 120 (H) 09/26/2019 0313   BUN 10 09/26/2019 0313   BUN 10 10/09/2018 1125   CREATININE 0.79 09/26/2019 0313   CREATININE 1.13 (H) 08/03/2016 1440   CALCIUM 8.6 (L) 09/26/2019 0313   GFRNONAA >60 09/26/2019 0313   GFRAA >60 09/26/2019 0313    INR    Component Value Date/Time   INR 1.0 09/25/2019 0757     Intake/Output Summary (Last 24 hours) at 09/28/2019 0911 Last data filed at 09/27/2019 1130 Gross per 24 hour  Intake 110 ml  Output --  Net 110 ml     Assessment/Plan:  64 y.o. female with s/p redo L femoral to BK pop bypass which has since occluded 3 Days Post-Op   Worsening ischemic pain L foot NO revascularization options Plan is for L AKA tomorrow with Dr. Darrick Penna Pain control on schedule today Consent NPO past  midnight   Mercedes Rutter, PA-C [facility] [phone] 09/28/2019 9:11 AM  I have independently interviewed and examined patient and agree with PA assessment and plan above.   Mikella Linsley C. Randie Heinz, MD [facility] [place] Office: [phone] Pager: [phone]

## 2019-09-28 NOTE — Plan of Care (Signed)
  Problem: Education: Goal: Knowledge of General Education information will improve Description Including pain rating scale, medication(s)/side effects and non-pharmacologic comfort measures Outcome: Progressing   

## 2019-09-29 ENCOUNTER — Encounter (HOSPITAL_COMMUNITY): Payer: Self-pay | Admitting: Certified Registered"

## 2019-09-29 ENCOUNTER — Encounter (HOSPITAL_COMMUNITY)
Admission: RE | Disposition: A | Discharge status: Home Health | Payer: Self-pay | Source: Home / Self Care | Attending: Vascular Surgery

## 2019-09-29 ENCOUNTER — Encounter (HOSPITAL_COMMUNITY): Payer: Self-pay | Admitting: Vascular Surgery

## 2019-09-29 ENCOUNTER — Other Ambulatory Visit: Payer: Self-pay

## 2019-09-29 LAB — CBC
HCT: 34.9 % — ABNORMAL LOW (ref 36.0–46.0)
Hemoglobin: 11 g/dL — ABNORMAL LOW (ref 12.0–15.0)
MCH: 24.6 pg — ABNORMAL LOW (ref 26.0–34.0)
MCHC: 31.5 g/dL (ref 30.0–36.0)
MCV: 77.9 fL — ABNORMAL LOW (ref 80.0–100.0)
Platelets: 477 10*3/uL — ABNORMAL HIGH (ref 150–400)
RBC: 4.48 MIL/uL (ref 3.87–5.11)
RDW: 15.4 % (ref 11.5–15.5)
WBC: 11.6 10*3/uL — ABNORMAL HIGH (ref 4.0–10.5)
nRBC: 0 % (ref 0.0–0.2)

## 2019-09-29 LAB — TROPONIN I (HIGH SENSITIVITY)
Troponin I (High Sensitivity): 8 ng/L (ref <18)
Troponin I (High Sensitivity): 9 ng/L (ref <18)

## 2019-09-29 SURGERY — AMPUTATION, ABOVE KNEE
Anesthesia: General | Site: Knee | Laterality: Left

## 2019-09-29 MED ORDER — LIDOCAINE 2% (20 MG/ML) 5 ML SYRINGE
INTRAMUSCULAR | Status: AC
  Filled 2019-09-29: qty 5

## 2019-09-29 MED ORDER — NITROGLYCERIN 0.4 MG SL SUBL: 0.4000 mg | SUBLINGUAL_TABLET | SUBLINGUAL | Status: DC | PRN

## 2019-09-29 MED ORDER — MIDAZOLAM HCL 2 MG/2ML IJ SOLN
INTRAMUSCULAR | Status: AC
  Filled 2019-09-29: qty 2

## 2019-09-29 MED ORDER — FENTANYL CITRATE (PF) 250 MCG/5ML IJ SOLN
INTRAMUSCULAR | Status: AC
  Filled 2019-09-29: qty 5

## 2019-09-29 MED ORDER — LACTATED RINGERS IV SOLN: INTRAVENOUS | Status: DC

## 2019-09-29 MED ORDER — PROPOFOL 10 MG/ML IV BOLUS
INTRAVENOUS | Status: AC
  Filled 2019-09-29: qty 20

## 2019-09-29 MED ORDER — HEPARIN SODIUM (PORCINE) 1000 UNIT/ML IJ SOLN
INTRAMUSCULAR | Status: AC
  Filled 2019-09-29: qty 1

## 2019-09-29 NOTE — Progress Notes (Signed)
OT Cancellation Note  Patient Details Name: Mercedes [name] MRN: [id number] DOB: 11/24/1955   Cancelled Treatment:    Reason Eval/Treat Not Completed: Pain limiting ability to participate;Other (comment) RN report pt in a lot of pain this AM and going to surgery later today for LLE AKA. Will follow up as time allows for OT session.   Audery Amel., COTA/L Acute Rehabilitation Services [phone]   Angelina Pih 09/29/2019, 9:29 AM

## 2019-09-29 NOTE — Progress Notes (Signed)
[facility] [place]  Subjective  - Pt c/o substernal chest pain and some back pain over last 2 hours. Some central back pain   Objective (!) 148/79 81 98.8 F (37.1 C) (Oral) 19 98%  Intake/Output Summary (Last 24 hours) at 09/29/2019 1408 Last data filed at 09/29/2019 1249 Gross per 24 hour  Intake 360 ml  Output 850 ml  Net -490 ml   2+ radial pulse bilat Abdomen soft Pain in left foot   Assessment/Planning: Pt with acute onset chest pain EKG normal  Will give NTG now Will cycle troponin Will consider CTA chest to rule out dissection if pain not improved soon or troponin negative Keep NPO for now  Fabienne Bruns 09/29/2019 2:08 PM --  Laboratory Lab Results: Recent Labs    09/29/19 0344  WBC 11.6*  HGB 11.0*  HCT 34.9*  PLT 477*   BMET No results for input(s): NA, K, CL, CO2, GLUCOSE, BUN, CREATININE, CALCIUM in the last 72 hours.  COAG Lab Results  Component Value Date   INR 1.0 09/25/2019   INR 1.1 05/15/2019   INR 1.2 05/12/2019   No results found for: PTT

## 2019-09-29 NOTE — Progress Notes (Signed)
Pt now chest pain free.  Troponin negative.  Will resume diet.  Her AkA was rescheduled for Wednesday 2/3  Fabienne Bruns, MD [facility] [place] Office: [phone]

## 2019-09-30 LAB — URINALYSIS, ROUTINE W REFLEX MICROSCOPIC
Bilirubin Urine: NEGATIVE
Glucose, UA: NEGATIVE mg/dL
Hgb urine dipstick: NEGATIVE
Ketones, ur: 5 mg/dL — AB
Leukocytes,Ua: NEGATIVE
Nitrite: NEGATIVE
Protein, ur: NEGATIVE mg/dL
Specific Gravity, Urine: 1.024 (ref 1.005–1.030)
pH: 6 (ref 5.0–8.0)

## 2019-09-30 LAB — TYPE AND SCREEN
ABO/RH(D): B POS
Antibody Screen: NEGATIVE

## 2019-09-30 MED ORDER — CEFAZOLIN SODIUM-DEXTROSE 2-4 GM/100ML-% IV SOLN
2.0000 g | INTRAVENOUS | Status: AC
  Administered 2019-10-01: 2 g via INTRAVENOUS
  Filled 2019-09-30 (×2): qty 100

## 2019-09-30 MED ORDER — CHLORHEXIDINE GLUCONATE CLOTH 2 % EX PADS
6.0000 | MEDICATED_PAD | Freq: Once | CUTANEOUS | Status: AC
  Administered 2019-09-30: 6 via TOPICAL

## 2019-09-30 MED ORDER — CHLORHEXIDINE GLUCONATE CLOTH 2 % EX PADS: 6.0000 | MEDICATED_PAD | Freq: Once | CUTANEOUS | Status: AC

## 2019-09-30 MED ORDER — SODIUM CHLORIDE 0.9 % IV SOLN: INTRAVENOUS | Status: DC

## 2019-09-30 NOTE — Progress Notes (Signed)
PT Cancellation Note  Patient Details Name: Mercedes [name] MRN: [id number] DOB: May 19, 1956   Cancelled Treatment:    Reason Eval/Treat Not Completed: Patient declined, no reason specified;Pain limiting ability to participate Pt states, "I just want to wash my face, finish my breakfast and be done for the day." Declines OOB mobility. Plans to go for AKA tomorrow. Will follow post op.   Blake Divine A Cristopher Ciccarelli 09/30/2019, 8:59 AM [place], PT, DPT Acute Rehabilitation Services Pager [phone] Office [phone]

## 2019-09-30 NOTE — H&P (View-Only) (Signed)
  Progress Note    09/30/2019 7:11 AM   Subjective:  Surgery post-poned yesterday due to CP which resolved. She has had not further CP. Awake and alert this morning. C/o of left lower extremity pain   Vitals:   09/29/19 2334 09/30/19 0409  BP: (!) 156/77 122/77  Pulse: 65 74  Resp: 15 15  Temp: 98.9 F (37.2 C) 98.7 F (37.1 C)  SpO2: 93% 99%    Physical Exam: Cardiac:  RRR Lungs:  CTA bil Extremities:  Left residual foot edematous, warm Troponin 1 = 8 and 9 ECG= NSR; no ST changes ischemic  CBC    Component Value Date/Time   WBC 11.6 (H) 09/29/2019 0344   RBC 4.48 09/29/2019 0344   HGB 11.0 (L) 09/29/2019 0344   HGB 14.2 10/09/2018 1125   HCT 34.9 (L) 09/29/2019 0344   HCT 44.6 10/09/2018 1125   PLT 477 (H) 09/29/2019 0344   PLT 289 10/09/2018 1125   MCV 77.9 (L) 09/29/2019 0344   MCV 79 10/09/2018 1125   MCH 24.6 (L) 09/29/2019 0344   MCHC 31.5 09/29/2019 0344   RDW 15.4 09/29/2019 0344   RDW 14.8 10/09/2018 1125   LYMPHSABS 2.5 05/22/2019 1816   MONOABS 0.6 05/22/2019 1816   EOSABS 0.4 05/22/2019 1816   BASOSABS 0.1 05/22/2019 1816    BMET    Component Value Date/Time   NA 134 (L) 09/26/2019 0313   NA 135 10/09/2018 1125   K 4.1 09/26/2019 0313   CL 102 09/26/2019 0313   CO2 23 09/26/2019 0313   GLUCOSE 120 (H) 09/26/2019 0313   BUN 10 09/26/2019 0313   BUN 10 10/09/2018 1125   CREATININE 0.79 09/26/2019 0313   CREATININE 1.13 (H) 08/03/2016 1440   CALCIUM 8.6 (L) 09/26/2019 0313   GFRNONAA >60 09/26/2019 0313   GFRAA >60 09/26/2019 0313     Intake/Output Summary (Last 24 hours) at 09/30/2019 0711 Last data filed at 09/30/2019 0002 Gross per 24 hour  Intake 0 ml  Output 500 ml  Net -500 ml    HOSPITAL MEDICATIONS Scheduled Meds: . docusate sodium  100 mg Oral Daily  . DULoxetine  30 mg Oral Daily  . heparin injection (subcutaneous)  5,000 Units Subcutaneous Q8H  . lisinopril  10 mg Oral Daily  . metoprolol tartrate  12.5 mg Oral BID   . pantoprazole  40 mg Oral Daily  . rosuvastatin  40 mg Oral q1800   Continuous Infusions: . sodium chloride    . lactated ringers 10 mL/hr at 09/29/19 1344  . magnesium sulfate bolus IVPB     PRN Meds:.sodium chloride, acetaminophen **OR** acetaminophen, alum & mag hydroxide-simeth, bisacodyl, guaiFENesin-dextromethorphan, hydrALAZINE, HYDROmorphone (DILAUDID) injection, labetalol, magnesium sulfate bolus IVPB, metoprolol tartrate, nitroGLYCERIN, ondansetron, oxyCODONE, phenol, potassium chloride, senna-docusate, sodium phosphate  Assessment:  64 y.o. female is s/p: left TMA; ischemic foot pain     Plan: -Left AKA tomorrow -DVT prophylaxis:  heparin   Wendi Maya, PA-C [facility] [phone] 09/30/2019  7:11 AM

## 2019-09-30 NOTE — Progress Notes (Signed)
OT Cancellation Note  Patient Details Name: Mercedes [name] MRN: [id number] DOB: 11-05-1955   Cancelled Treatment:    Reason Eval/Treat Not Completed: Patient declined, no reason specified;Pain limiting ability to participate. Declines any OOB activity/mobility. Plans to go for AKA tomorrow. Will follow up post op as appropriate  Galen Manila 09/30/2019, 10:19 AM

## 2019-09-30 NOTE — Progress Notes (Signed)
  Progress Note    09/30/2019 7:11 AM   Subjective:  Surgery post-poned yesterday due to CP which resolved. She has had not further CP. Awake and alert this morning. C/o of left lower extremity pain   Vitals:   09/29/19 2334 09/30/19 0409  BP: (!) 156/77 122/77  Pulse: 65 74  Resp: 15 15  Temp: 98.9 F (37.2 C) 98.7 F (37.1 C)  SpO2: 93% 99%    Physical Exam: Cardiac:  RRR Lungs:  CTA bil Extremities:  Left residual foot edematous, warm Troponin 1 = 8 and 9 ECG= NSR; no ST changes ischemic  CBC    Component Value Date/Time   WBC 11.6 (H) 09/29/2019 0344   RBC 4.48 09/29/2019 0344   HGB 11.0 (L) 09/29/2019 0344   HGB 14.2 10/09/2018 1125   HCT 34.9 (L) 09/29/2019 0344   HCT 44.6 10/09/2018 1125   PLT 477 (H) 09/29/2019 0344   PLT 289 10/09/2018 1125   MCV 77.9 (L) 09/29/2019 0344   MCV 79 10/09/2018 1125   MCH 24.6 (L) 09/29/2019 0344   MCHC 31.5 09/29/2019 0344   RDW 15.4 09/29/2019 0344   RDW 14.8 10/09/2018 1125   LYMPHSABS 2.5 05/22/2019 1816   MONOABS 0.6 05/22/2019 1816   EOSABS 0.4 05/22/2019 1816   BASOSABS 0.1 05/22/2019 1816    BMET    Component Value Date/Time   NA 134 (L) 09/26/2019 0313   NA 135 10/09/2018 1125   K 4.1 09/26/2019 0313   CL 102 09/26/2019 0313   CO2 23 09/26/2019 0313   GLUCOSE 120 (H) 09/26/2019 0313   BUN 10 09/26/2019 0313   BUN 10 10/09/2018 1125   CREATININE 0.79 09/26/2019 0313   CREATININE 1.13 (H) 08/03/2016 1440   CALCIUM 8.6 (L) 09/26/2019 0313   GFRNONAA >60 09/26/2019 0313   GFRAA >60 09/26/2019 0313     Intake/Output Summary (Last 24 hours) at 09/30/2019 0711 Last data filed at 09/30/2019 0002 Gross per 24 hour  Intake 0 ml  Output 500 ml  Net -500 ml    HOSPITAL MEDICATIONS Scheduled Meds: . docusate sodium  100 mg Oral Daily  . DULoxetine  30 mg Oral Daily  . heparin injection (subcutaneous)  5,000 Units Subcutaneous Q8H  . lisinopril  10 mg Oral Daily  . metoprolol tartrate  12.5 mg Oral BID   . pantoprazole  40 mg Oral Daily  . rosuvastatin  40 mg Oral q1800   Continuous Infusions: . sodium chloride    . lactated ringers 10 mL/hr at 09/29/19 1344  . magnesium sulfate bolus IVPB     PRN Meds:.sodium chloride, acetaminophen **OR** acetaminophen, alum & mag hydroxide-simeth, bisacodyl, guaiFENesin-dextromethorphan, hydrALAZINE, HYDROmorphone (DILAUDID) injection, labetalol, magnesium sulfate bolus IVPB, metoprolol tartrate, nitroGLYCERIN, ondansetron, oxyCODONE, phenol, potassium chloride, senna-docusate, sodium phosphate  Assessment:  63 y.o. female is s/p: left TMA; ischemic foot pain     Plan: -Left AKA tomorrow -DVT prophylaxis:  heparin   [name], PA-C [facility] [phone] 09/30/2019  7:11 AM 

## 2019-09-30 NOTE — Plan of Care (Signed)
  Problem: Clinical Measurements: Goal: Respiratory complications will improve Outcome: Progressing Goal: Cardiovascular complication will be avoided Outcome: Progressing   Problem: Clinical Measurements: Goal: Will remain free from infection Outcome: Not Progressing   Problem: Activity: Goal: Risk for activity intolerance will decrease Outcome: Not Progressing

## 2019-10-01 ENCOUNTER — Encounter (HOSPITAL_COMMUNITY)
Admission: RE | Disposition: A | Discharge status: Home Health | Payer: Self-pay | Source: Home / Self Care | Attending: Vascular Surgery

## 2019-10-01 ENCOUNTER — Ambulatory Visit (HOSPITAL_COMMUNITY): Admission: RE | Admit: 2019-10-01 | Payer: Medicare Other | Source: Ambulatory Visit

## 2019-10-01 ENCOUNTER — Inpatient Hospital Stay (HOSPITAL_COMMUNITY): Payer: Medicare Other | Admitting: Certified Registered Nurse Anesthetist

## 2019-10-01 DIAGNOSIS — I998 Other disorder of circulatory system: Secondary | ICD-10-CM

## 2019-10-01 HISTORY — PX: AMPUTATION: SHX166

## 2019-10-01 LAB — COMPREHENSIVE METABOLIC PANEL
ALT: 38 U/L (ref 0–44)
AST: 66 U/L — ABNORMAL HIGH (ref 15–41)
Albumin: 2.5 g/dL — ABNORMAL LOW (ref 3.5–5.0)
Alkaline Phosphatase: 88 U/L (ref 38–126)
Anion gap: 9 (ref 5–15)
BUN: 10 mg/dL (ref 8–23)
CO2: 26 mmol/L (ref 22–32)
Calcium: 9 mg/dL (ref 8.9–10.3)
Chloride: 98 mmol/L (ref 98–111)
Creatinine, Ser: 0.85 mg/dL (ref 0.44–1.00)
GFR calc Af Amer: >60 mL/min (ref >60)
GFR calc non Af Amer: >60 mL/min (ref >60)
Glucose, Bld: 120 mg/dL — ABNORMAL HIGH (ref 70–99)
Potassium: 4.3 mmol/L (ref 3.5–5.1)
Sodium: 133 mmol/L — ABNORMAL LOW (ref 135–145)
Total Bilirubin: 0.6 mg/dL (ref 0.3–1.2)
Total Protein: 7.5 g/dL (ref 6.5–8.1)

## 2019-10-01 LAB — CBC
HCT: 34.9 % — ABNORMAL LOW (ref 36.0–46.0)
Hemoglobin: 11 g/dL — ABNORMAL LOW (ref 12.0–15.0)
MCH: 24.2 pg — ABNORMAL LOW (ref 26.0–34.0)
MCHC: 31.5 g/dL (ref 30.0–36.0)
MCV: 76.9 fL — ABNORMAL LOW (ref 80.0–100.0)
Platelets: 522 10*3/uL — ABNORMAL HIGH (ref 150–400)
RBC: 4.54 MIL/uL (ref 3.87–5.11)
RDW: 15.2 % (ref 11.5–15.5)
WBC: 14 10*3/uL — ABNORMAL HIGH (ref 4.0–10.5)
nRBC: 0 % (ref 0.0–0.2)

## 2019-10-01 LAB — APTT: aPTT: 38 seconds — ABNORMAL HIGH (ref 24–36)

## 2019-10-01 LAB — PROTIME-INR
INR: 1.1 (ref 0.8–1.2)
Prothrombin Time: 14.4 seconds (ref 11.4–15.2)

## 2019-10-01 SURGERY — AMPUTATION, ABOVE KNEE
Anesthesia: General | Site: Knee | Laterality: Left

## 2019-10-01 MED ORDER — FENTANYL CITRATE (PF) 250 MCG/5ML IJ SOLN
INTRAMUSCULAR | Status: AC
  Filled 2019-10-01: qty 5

## 2019-10-01 MED ORDER — PHENYLEPHRINE 40 MCG/ML (10ML) SYRINGE FOR IV PUSH (FOR BLOOD PRESSURE SUPPORT)
PREFILLED_SYRINGE | INTRAVENOUS | Status: DC | PRN
  Administered 2019-10-01 (×2): 120 ug via INTRAVENOUS

## 2019-10-01 MED ORDER — ONDANSETRON HCL 4 MG/2ML IJ SOLN
INTRAMUSCULAR | Status: DC | PRN
  Administered 2019-10-01: 4 mg via INTRAVENOUS

## 2019-10-01 MED ORDER — LACTATED RINGERS IV SOLN: INTRAVENOUS | Status: DC | PRN

## 2019-10-01 MED ORDER — FENTANYL CITRATE (PF) 100 MCG/2ML IJ SOLN
25.0000 ug | INTRAMUSCULAR | Status: DC | PRN
  Administered 2019-10-01: 50 ug via INTRAVENOUS
  Administered 2019-10-01: 25 ug via INTRAVENOUS

## 2019-10-01 MED ORDER — ROCURONIUM BROMIDE 10 MG/ML (PF) SYRINGE
PREFILLED_SYRINGE | INTRAVENOUS | Status: AC
  Filled 2019-10-01: qty 10

## 2019-10-01 MED ORDER — LIDOCAINE 2% (20 MG/ML) 5 ML SYRINGE
INTRAMUSCULAR | Status: AC
  Filled 2019-10-01: qty 5

## 2019-10-01 MED ORDER — OXYCODONE HCL 5 MG PO TABS
ORAL_TABLET | ORAL | Status: AC
  Filled 2019-10-01: qty 1

## 2019-10-01 MED ORDER — SODIUM CHLORIDE 0.9 % IV SOLN: INTRAVENOUS | Status: DC

## 2019-10-01 MED ORDER — STERILE WATER FOR IRRIGATION IR SOLN
Status: DC | PRN
  Administered 2019-10-01: 1000 mL

## 2019-10-01 MED ORDER — BACITRACIN-NEOMYCIN-POLYMYXIN OINTMENT TUBE
TOPICAL_OINTMENT | CUTANEOUS | Status: AC
  Filled 2019-10-01: qty 14.17

## 2019-10-01 MED ORDER — DEXAMETHASONE SODIUM PHOSPHATE 10 MG/ML IJ SOLN
INTRAMUSCULAR | Status: DC | PRN
  Administered 2019-10-01: 4 mg via INTRAVENOUS

## 2019-10-01 MED ORDER — PROPOFOL 10 MG/ML IV BOLUS
INTRAVENOUS | Status: AC
  Filled 2019-10-01: qty 20

## 2019-10-01 MED ORDER — PROPOFOL 1000 MG/100ML IV EMUL
INTRAVENOUS | Status: AC
  Filled 2019-10-01: qty 100

## 2019-10-01 MED ORDER — MIDAZOLAM HCL 2 MG/2ML IJ SOLN
INTRAMUSCULAR | Status: AC
  Filled 2019-10-01: qty 2

## 2019-10-01 MED ORDER — LIDOCAINE 2% (20 MG/ML) 5 ML SYRINGE
INTRAMUSCULAR | Status: DC | PRN
  Administered 2019-10-01: 50 mg via INTRAVENOUS

## 2019-10-01 MED ORDER — EPHEDRINE SULFATE-NACL 50-0.9 MG/10ML-% IV SOSY
PREFILLED_SYRINGE | INTRAVENOUS | Status: DC | PRN
  Administered 2019-10-01: 15 mg via INTRAVENOUS
  Administered 2019-10-01: 10 mg via INTRAVENOUS
  Administered 2019-10-01: 5 mg via INTRAVENOUS
  Administered 2019-10-01: 10 mg via INTRAVENOUS

## 2019-10-01 MED ORDER — ONDANSETRON HCL 4 MG/2ML IJ SOLN: 4.0000 mg | Freq: Once | INTRAMUSCULAR | Status: DC | PRN

## 2019-10-01 MED ORDER — OXYCODONE HCL 5 MG/5ML PO SOLN: 5.0000 mg | Freq: Once | ORAL | Status: DC | PRN

## 2019-10-01 MED ORDER — 0.9 % SODIUM CHLORIDE (POUR BTL) OPTIME
TOPICAL | Status: DC | PRN
  Administered 2019-10-01 (×2): 1000 mL

## 2019-10-01 MED ORDER — FENTANYL CITRATE (PF) 100 MCG/2ML IJ SOLN
INTRAMUSCULAR | Status: AC
  Administered 2019-10-01: 25 ug via INTRAVENOUS
  Filled 2019-10-01: qty 2

## 2019-10-01 MED ORDER — ONDANSETRON HCL 4 MG/2ML IJ SOLN
INTRAMUSCULAR | Status: AC
  Filled 2019-10-01: qty 2

## 2019-10-01 MED ORDER — MIDAZOLAM HCL 2 MG/2ML IJ SOLN
INTRAMUSCULAR | Status: DC | PRN
  Administered 2019-10-01: 1 mg via INTRAVENOUS

## 2019-10-01 MED ORDER — METOPROLOL TARTRATE 12.5 MG HALF TABLET
12.5000 mg | ORAL_TABLET | Freq: Two times a day (BID) | ORAL | Status: DC
  Administered 2019-10-02 – 2019-10-06 (×9): 12.5 mg via ORAL
  Filled 2019-10-01 (×10): qty 1

## 2019-10-01 MED ORDER — OXYCODONE HCL 5 MG PO TABS
ORAL_TABLET | ORAL | Status: AC
  Administered 2019-10-01: 10 mg via ORAL
  Filled 2019-10-01: qty 1

## 2019-10-01 MED ORDER — CEFAZOLIN SODIUM-DEXTROSE 2-4 GM/100ML-% IV SOLN
2.0000 g | Freq: Three times a day (TID) | INTRAVENOUS | Status: AC
  Administered 2019-10-01 (×2): 2 g via INTRAVENOUS
  Filled 2019-10-01 (×2): qty 100

## 2019-10-01 MED ORDER — HEPARIN SODIUM (PORCINE) 5000 UNIT/ML IJ SOLN
5000.0000 [IU] | Freq: Three times a day (TID) | INTRAMUSCULAR | Status: DC
  Administered 2019-10-02 – 2019-10-03 (×3): 5000 [IU] via SUBCUTANEOUS
  Filled 2019-10-01 (×6): qty 1

## 2019-10-01 MED ORDER — BACITRACIN-NEOMYCIN-POLYMYXIN OINTMENT TUBE
TOPICAL_OINTMENT | CUTANEOUS | Status: DC | PRN
  Administered 2019-10-01: 1 via TOPICAL

## 2019-10-01 MED ORDER — PHENYLEPHRINE 40 MCG/ML (10ML) SYRINGE FOR IV PUSH (FOR BLOOD PRESSURE SUPPORT)
PREFILLED_SYRINGE | INTRAVENOUS | Status: AC
  Filled 2019-10-01: qty 10

## 2019-10-01 MED ORDER — PROPOFOL 10 MG/ML IV BOLUS
INTRAVENOUS | Status: DC | PRN
  Administered 2019-10-01: 20 mg via INTRAVENOUS
  Administered 2019-10-01: 80 mg via INTRAVENOUS

## 2019-10-01 MED ORDER — FENTANYL CITRATE (PF) 100 MCG/2ML IJ SOLN
INTRAMUSCULAR | Status: DC | PRN
  Administered 2019-10-01: 50 ug via INTRAVENOUS
  Administered 2019-10-01 (×2): 25 ug via INTRAVENOUS

## 2019-10-01 MED ORDER — OXYCODONE HCL 5 MG PO TABS: 5.0000 mg | ORAL_TABLET | Freq: Once | ORAL | Status: DC | PRN

## 2019-10-01 MED ORDER — DEXAMETHASONE SODIUM PHOSPHATE 10 MG/ML IJ SOLN
INTRAMUSCULAR | Status: AC
  Filled 2019-10-01: qty 1

## 2019-10-01 SURGICAL SUPPLY — 39 items
BLADE SAW RECIP 87.9 MT (BLADE) ×2 IMPLANT
BNDG COHESIVE 4X5 TAN STRL (GAUZE/BANDAGES/DRESSINGS) ×1 IMPLANT
BNDG COHESIVE 6X5 TAN STRL LF (GAUZE/BANDAGES/DRESSINGS) ×2 IMPLANT
BNDG ELASTIC 4X5.8 VLCR STR LF (GAUZE/BANDAGES/DRESSINGS) ×1 IMPLANT
BNDG ELASTIC 6X5.8 VLCR STR LF (GAUZE/BANDAGES/DRESSINGS) ×1 IMPLANT
BNDG GAUZE ELAST 4 BULKY (GAUZE/BANDAGES/DRESSINGS) ×2 IMPLANT
CANISTER SUCT 3000ML PPV (MISCELLANEOUS) ×2 IMPLANT
CLIP VESOCCLUDE MED 6/CT (CLIP) ×1 IMPLANT
COVER SURGICAL LIGHT HANDLE (MISCELLANEOUS) ×2 IMPLANT
DRAPE HALF SHEET 40X57 (DRAPES) ×3 IMPLANT
DRAPE ORTHO SPLIT 77X108 STRL (DRAPES) ×2
DRAPE SURG ORHT 6 SPLT 77X108 (DRAPES) ×2 IMPLANT
DRSG ADAPTIC 3X8 NADH LF (GAUZE/BANDAGES/DRESSINGS) ×2 IMPLANT
ELECT CAUTERY BLADE 6.4 (BLADE) ×2 IMPLANT
ELECT REM PT RETURN 9FT ADLT (ELECTROSURGICAL) ×2
ELECTRODE REM PT RTRN 9FT ADLT (ELECTROSURGICAL) ×1 IMPLANT
EVACUATOR SILICONE 100CC (DRAIN) IMPLANT
GAUZE SPONGE 4X4 12PLY STRL (GAUZE/BANDAGES/DRESSINGS) ×2 IMPLANT
GLOVE BIOGEL PI IND STRL 6.5 (GLOVE) IMPLANT
GLOVE BIOGEL PI INDICATOR 6.5 (GLOVE) ×3
GLOVE SURG SS PI 6.0 STRL IVOR (GLOVE) ×1 IMPLANT
GLOVE SURG SS PI 6.5 STRL IVOR (GLOVE) ×2 IMPLANT
GLOVE SURG SS PI 7.5 STRL IVOR (GLOVE) ×1 IMPLANT
GOWN STRL REUS W/ TWL LRG LVL3 (GOWN DISPOSABLE) ×3 IMPLANT
GOWN STRL REUS W/TWL LRG LVL3 (GOWN DISPOSABLE) ×3
KIT BASIN OR (CUSTOM PROCEDURE TRAY) ×2 IMPLANT
KIT TURNOVER KIT B (KITS) ×2 IMPLANT
NS IRRIG 1000ML POUR BTL (IV SOLUTION) ×3 IMPLANT
PACK GENERAL/GYN (CUSTOM PROCEDURE TRAY) ×2 IMPLANT
PAD ARMBOARD 7.5X6 YLW CONV (MISCELLANEOUS) ×4 IMPLANT
STAPLER VISISTAT 35W (STAPLE) ×1 IMPLANT
STOCKINETTE IMPERVIOUS LG (DRAPES) ×2 IMPLANT
SUT SILK 2 0 SH CR/8 (SUTURE) ×2 IMPLANT
SUT SILK 2 0 TIES 10X30 (SUTURE) ×2 IMPLANT
SUT VIC AB 2-0 SH 18 (SUTURE) ×5 IMPLANT
SUT VIC AB 3-0 SH 27 (SUTURE) ×1
SUT VIC AB 3-0 SH 27X BRD (SUTURE) ×1 IMPLANT
TOWEL GREEN STERILE (TOWEL DISPOSABLE) ×4 IMPLANT
WATER STERILE IRR 1000ML POUR (IV SOLUTION) ×2 IMPLANT

## 2019-10-01 NOTE — Op Note (Signed)
[facility] OPERATIVE NOTE  Procedure: Left above knee amputation  Surgeon(s): Sherren Kerns, MD  ASSISTANT: Doreatha Massed, PA-C  Anesthesia: General  Specimens: Left leg  PROCEDURE DETAIL: After obtaining informed consent, the patient was taken to the operating room. The patient was placed in supine position the operating room table. After induction of general anesthesia and endotracheal intubation the entire left lower extremity was prepped and draped in usual sterile fashion. A circumferential incision was made on the left leg just above the knee. The incision was carried down into the sucutaneous tissues down to level of a preexisting PTFE bypass graft.  This had no pulse. This was ligated and divided between silk ties. It was allowed to retract up into the thigh.  Soft tissues were taken down as well as the muscle and fascia with cautery. The superficial femoral artery and vein were dissected free circumferentially clamped and divided. These were suture ligated proximally. A thrombosed vein graft was also clamped divided and ligated with a 2 0 silk tie.  Remainder of the soft tissues were taken down with cautery. The periosteum was raised on the femur approximately 5 cm above the skin edge. The femur was divided at this level. The leg was passed off the table as a specimen. Hemostasis was obtained. The wound was thoroughly irrigated with normal saline solution. The fascial edges were reapproximated using interrupted 2 0 Vicryl sutures. The subcutaneous tissues reapproximated using a running 3-0 Vicryl suture. The skin was closed staples. Patient tolerated procedure well and there were no complications. Instrument sponge and needle counts correct in the case. Patient was taken to recovery in stable condition.  Fabienne Bruns, MD [facility] [place] Office: [phone] Pager: [phone]

## 2019-10-01 NOTE — Interval H&P Note (Signed)
History and Physical Interval Note:  10/01/2019 8:22 AM  Mercedes [name]  has presented today for surgery, with the diagnosis of NON VIABLE TISSUE.  The various methods of treatment have been discussed with the patient and family. After consideration of risks, benefits and other options for treatment, the patient has consented to  Procedure(s): AMPUTATION ABOVE KNEE (Left) as a surgical intervention.  The patient's history has been reviewed, patient examined, no change in status, stable for surgery.  I have reviewed the patient's chart and labs.  Questions were answered to the patient's satisfaction.     Fabienne Bruns

## 2019-10-01 NOTE — Progress Notes (Signed)
PT received from PACU. PT vitals stable. Telebox applied. Pt states she has some pain. Will continue to monitor.  Lacy Duverney, RN

## 2019-10-01 NOTE — Anesthesia Procedure Notes (Signed)
Procedure Name: LMA Insertion Date/Time: 10/01/2019 8:43 AM Performed by: Jodell Cipro, CRNA Pre-anesthesia Checklist: Patient identified, Emergency Drugs available, Suction available and Patient being monitored Patient Re-evaluated:Patient Re-evaluated prior to induction Oxygen Delivery Method: Circle System Utilized Preoxygenation: Pre-oxygenation with 100% oxygen Induction Type: IV induction Ventilation: Mask ventilation without difficulty LMA: LMA inserted LMA Size: 4.0 Number of attempts: 1 Placement Confirmation: positive ETCO2 Tube secured with: Tape Dental Injury: Teeth and Oropharynx as per pre-operative assessment

## 2019-10-01 NOTE — Anesthesia Preprocedure Evaluation (Signed)
Anesthesia Evaluation  Patient identified by MRN, date of birth, ID band Patient awake    Reviewed: Allergy & Precautions, H&P , NPO status , Patient's Chart, lab work & pertinent test results, reviewed documented beta blocker date and time   Airway Mallampati: II  TM Distance: >3 FB Neck ROM: Full    Dental  (+) Edentulous Upper, Partial Lower, Dental Advisory Given   Pulmonary neg pulmonary ROS, former smoker,    Pulmonary exam normal        Cardiovascular hypertension, Pt. on medications and Pt. on home beta blockers + CAD, + Past MI, + Cardiac Stents, + CABG and + Peripheral Vascular Disease  Normal cardiovascular exam+ dysrhythmias Atrial Fibrillation + pacemaker    Hx Takotsubo cardiomyopathy 2010  '17 TEE - Concentric hypertrophy of mild severity. EF of 60-65%. Trace MR, TR, and PR.  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  Cardiac clearance per telephone encounter 09/23/19, "She was recently seen in the office by Dr. Jens Som on 09/18/19. She has a history of CABG and PAF. She was having no anginal symptoms. She was remaining in sinus rhythm on beta blocker. She has a pacemaker. Given her current clinical status,based on ACC/AHA guidelines,Alegandra L McCollumwould be at acceptable risk for the planned procedure without further cardiovascular testing. Pt takes Eliquis for afib with CHADS2VASc score of 5-7 (age, sex, HTN, CAD/PVD, DM, ? CVA). Ok to hold Eliquis for 24 hours pre-op, resume as soon as possible after given ?hx of stroke and elevated cardiac risk."  Pt also follows with EP cardiology for history tachy/brady syndrome s/p St Jude leadless PPM 2016. She  was last seen Feb 2020 and at that time device was at RRT and not responding appropriately to magnet. It was decided to pursue watchful waiting as pt was doing well overall. I previously reached out to EP cardiology NP [name] prior to pt's endarterectomy on 05/12/19. Per [facility], the pt did not need any further EP eval prior to surgery and nothing special needed to be done perioperatively as the pt has elected to not replace the device due to absence of symptoms.     Neuro/Psych  Headaches, PSYCHIATRIC DISORDERS Anxiety Depression  Arnold-Chiari Type I Denies hx seizures     GI/Hepatic Neg liver ROS, GERD  Controlled,  Endo/Other    Renal/GU Renal InsufficiencyRenal disease     Musculoskeletal  (+) Arthritis , Osteoarthritis and Rheumatoid disorders,  SLE   Abdominal   Peds  Hematology  (+) Blood dyscrasia, anemia ,   Anesthesia Other Findings   Reproductive/Obstetrics                             Anesthesia Physical  Anesthesia Plan  ASA: IV  Anesthesia Plan: General   Post-op Pain Management:    Induction: Intravenous  PONV Risk Score and Plan: 4 or greater and Ondansetron, Dexamethasone, Treatment may vary due to age or medical condition and Midazolam  Airway Management Planned: LMA  Additional Equipment: None  Intra-op Plan:   Post-operative Plan: Extubation in OR  Informed Consent: I have reviewed the patients History and Physical, chart, labs and discussed the procedure including the risks, benefits and alternatives for the proposed anesthesia with the  patient or authorized representative who has indicated his/her understanding and acceptance.     Dental advisory given  Plan Discussed with:   Anesthesia Plan Comments:         Anesthesia Quick Evaluation                                  Anesthesia Evaluation  Patient identified by MRN, date of birth, ID band Patient awake    Reviewed: Allergy & Precautions, NPO  status , Patient's Chart, lab work & pertinent test results  History of Anesthesia Complications Negative for: history of anesthetic complications  Airway Mallampati: II  TM Distance: >3 FB Neck ROM: Full    Dental  (+) Dental Advisory Given, Edentulous Upper, Partial Lower   Pulmonary former smoker,    Pulmonary exam normal        Cardiovascular hypertension, Pt. on medications and Pt. on home beta blockers + CAD, + Past MI, + Cardiac Stents and + Peripheral Vascular Disease  Normal cardiovascular exam+ dysrhythmias Atrial Fibrillation + pacemaker    Hx Takotsubo cardiomyopathy 2010  '17 TEE - Concentric hypertrophy of mild severity. EF of 60-65%. Trace MR, TR, and PR.  Per note "Preop cardiac evaluation-at this time she is unable to perform 4 METS of activity to assess her cardiac health. Chart reviewed as part of pre-operative protocol coverage. Because of Florice L Morren's past medical history, she will require a Lexiscan Myoview stress test in order to better assess preoperative cardiovascular risk. Her RCRI riskis class IV,with a 11% risk of a major cardiac event. Reviewed with Dr. Gilman Schmidt. Further cardiac risk evaluation is needed." Pt also follows with EP Cardiology for tachy/brady syndrome s/p St Jude leadless PPM 2016. She was last seen Feb 2020 and at that time device was at RRT and not responding appropriately to magnet. It was decided to pursue watchful waiting as pt was doing well overall. I reached out to EP cardiology NP Chanetta Marshall and she said the pt did not need any further EP eval prior to surgery and that nothing special needed to be done perioperatively as the pt has elected to not replace the device due to absence of symptoms.  Pt presented to ED 05/09/19 and admitted with worsening foot pain. Outpatient stress test not done as it was scheduled to be done on day of admission.      Neuro/Psych PSYCHIATRIC DISORDERS Anxiety Depression   Arnold-Chiari Type I Denies hx seizures  TIA   GI/Hepatic Neg liver ROS, GERD  Controlled and Medicated,  Endo/Other   Denies hx of DM   Renal/GU negative Renal ROS     Musculoskeletal  (+) Arthritis , Rheumatoid disorders,    Abdominal   Peds  Hematology negative hematology ROS (+)   Anesthesia Other Findings SLE Sjogren's syndrome    Reproductive/Obstetrics                           Anesthesia Physical Anesthesia Plan  ASA: IV  Anesthesia Plan: General   Post-op Pain Management:    Induction: Intravenous  PONV Risk Score and Plan: 4 or greater and Treatment may vary due to age or medical condition, Ondansetron and Dexamethasone  Airway Management Planned: Oral ETT  Additional Equipment: Arterial line  Intra-op Plan:   Post-operative Plan: Extubation in OR  Informed Consent: I have reviewed the patients History and Physical, chart, labs  and discussed the procedure including the risks, benefits and alternatives for the proposed anesthesia with the patient or authorized representative who has indicated his/her understanding and acceptance.     Dental advisory given  Plan Discussed with: CRNA and Anesthesiologist  Anesthesia Plan Comments:    Anesthesia Quick Evaluation

## 2019-10-01 NOTE — Plan of Care (Signed)
  Problem: Clinical Measurements: Goal: Respiratory complications will improve Outcome: Progressing Goal: Cardiovascular complication will be avoided Outcome: Progressing   

## 2019-10-01 NOTE — Transfer of Care (Signed)
Immediate Anesthesia Transfer of Care Note  Patient: Mercedes [name]  Procedure(s) Performed: LEFT AMPUTATION ABOVE KNEE (Left Knee)  Patient Location: PACU  Anesthesia Type:General  Level of Consciousness: sedated  Airway & Oxygen Therapy: Patient Spontanous Breathing and Patient connected to nasal cannula oxygen  Post-op Assessment: Report given to RN and Post -op Vital signs reviewed and stable  Post vital signs: Reviewed and stable  Last Vitals:  Vitals Value Taken Time  BP 143/66 10/01/19 0953  Temp 36.6 C 10/01/19 0955  Pulse 73 10/01/19 1000  Resp 11 10/01/19 1000  SpO2 100 % 10/01/19 1000  Vitals shown include unvalidated device data.  Last Pain:  Vitals:   10/01/19 0955  TempSrc:   PainSc: Asleep      Patients Stated Pain Goal: 2 (09/30/19 1319)  Complications: No apparent anesthesia complications

## 2019-10-02 ENCOUNTER — Encounter: Payer: Self-pay | Admitting: *Deleted

## 2019-10-02 LAB — CBC
HCT: 33.5 % — ABNORMAL LOW (ref 36.0–46.0)
Hemoglobin: 10.4 g/dL — ABNORMAL LOW (ref 12.0–15.0)
MCH: 24.4 pg — ABNORMAL LOW (ref 26.0–34.0)
MCHC: 31 g/dL (ref 30.0–36.0)
MCV: 78.6 fL — ABNORMAL LOW (ref 80.0–100.0)
Platelets: 582 10*3/uL — ABNORMAL HIGH (ref 150–400)
RBC: 4.26 MIL/uL (ref 3.87–5.11)
RDW: 15.5 % (ref 11.5–15.5)
WBC: 12.1 10*3/uL — ABNORMAL HIGH (ref 4.0–10.5)
nRBC: 0 % (ref 0.0–0.2)

## 2019-10-02 LAB — BASIC METABOLIC PANEL
Anion gap: 7 (ref 5–15)
BUN: 10 mg/dL (ref 8–23)
CO2: 26 mmol/L (ref 22–32)
Calcium: 8.7 mg/dL — ABNORMAL LOW (ref 8.9–10.3)
Chloride: 102 mmol/L (ref 98–111)
Creatinine, Ser: 0.78 mg/dL (ref 0.44–1.00)
GFR calc Af Amer: >60 mL/min (ref >60)
GFR calc non Af Amer: >60 mL/min (ref >60)
Glucose, Bld: 113 mg/dL — ABNORMAL HIGH (ref 70–99)
Potassium: 4.4 mmol/L (ref 3.5–5.1)
Sodium: 135 mmol/L (ref 135–145)

## 2019-10-02 MED ORDER — CYCLOBENZAPRINE HCL 10 MG PO TABS
10.0000 mg | ORAL_TABLET | Freq: Three times a day (TID) | ORAL | Status: DC
  Administered 2019-10-02 – 2019-10-06 (×13): 10 mg via ORAL
  Filled 2019-10-02 (×13): qty 1

## 2019-10-02 NOTE — Consult Note (Signed)
PV Navigator Consult  Able to meet with patient at bedside for assessment. I introduced myself and role. Contact information left with patient.    She is well known to vascular services. Has had multiple surgeries to LLE including fem/pop bypass graft 2015. L foot occlusion of L fem/pop bypass graft with thrombectomy of graft, then ultimately L TMA 06/2019. 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  Medical history includes: PVOD, HLD, HTN, PAD, CAD, MI, AF, Pacemaker and RA. She is a former smoker, quit 2017.  Active consults: PT/OT and IP rehab  Patient states she lives alone. Her daughter lives in [place] and will be coming back into [employer]. She would like to be approved for inpatient rehab, however states her insurance will not likely approve this. She is prepared to discuss home with home health services with her daughter this evening when she arrives but said she would not consider SNF placement. She is optimistic that her daughter will be willing to help her out a few weeks until she has transitioned. If daughter unwilling, may need to consider other options for safety.  Will follow. Thank you, Hilma Favors RN BSN CWS PV Navigator [facility] [phone]

## 2019-10-02 NOTE — Progress Notes (Addendum)
  Progress Note    10/02/2019 7:51 AM 1 Day Post-Op  Subjective:  Says her preoperative pain is much improved.  Her surgical pain is bearable.   Afebrile  Vitals:   10/02/19 0037 10/02/19 0550  BP: (!) 158/75 (!) 152/69  Pulse: 67 68  Resp: 13 16  Temp: 97.8 F (36.6 C) 98.4 F (36.9 C)  SpO2: 99% 99%    Physical Exam: Incisions:  Bandage is in place and clean and dry.    CBC    Component Value Date/Time   WBC 12.1 (H) 10/02/2019 0219   RBC 4.26 10/02/2019 0219   HGB 10.4 (L) 10/02/2019 0219   HGB 14.2 10/09/2018 1125   HCT 33.5 (L) 10/02/2019 0219   HCT 44.6 10/09/2018 1125   PLT 582 (H) 10/02/2019 0219   PLT 289 10/09/2018 1125   MCV 78.6 (L) 10/02/2019 0219   MCV 79 10/09/2018 1125   MCH 24.4 (L) 10/02/2019 0219   MCHC 31.0 10/02/2019 0219   RDW 15.5 10/02/2019 0219   RDW 14.8 10/09/2018 1125   LYMPHSABS 2.5 05/22/2019 1816   MONOABS 0.6 05/22/2019 1816   EOSABS 0.4 05/22/2019 1816   BASOSABS 0.1 05/22/2019 1816    BMET    Component Value Date/Time   NA 135 10/02/2019 0219   NA 135 10/09/2018 1125   K 4.4 10/02/2019 0219   CL 102 10/02/2019 0219   CO2 26 10/02/2019 0219   GLUCOSE 113 (H) 10/02/2019 0219   BUN 10 10/02/2019 0219   BUN 10 10/09/2018 1125   CREATININE 0.78 10/02/2019 0219   CREATININE 1.13 (H) 08/03/2016 1440   CALCIUM 8.7 (L) 10/02/2019 0219   GFRNONAA >60 10/02/2019 0219   GFRAA >60 10/02/2019 0219    INR    Component Value Date/Time   INR 1.1 10/01/2019 0328     Intake/Output Summary (Last 24 hours) at 10/02/2019 0751 Last data filed at 10/02/2019 0600 Gross per 24 hour  Intake 2239.86 ml  Output 1350 ml  Net 889.86 ml     Assessment/Plan:  64 y.o. female is s/p left above knee amputation  1 Day Post-Op  -discussed with pt we will take down the dressing tomorrow.  We discussed what it will look like when the dressing is removed.  She is nervous about seeing it.  -also discussed with her that PT will work with her.   She has great grand kids that she wants to get back functional for.  Hopeful she will be accepted to [facility] for inpatient rehab.     Doreatha Massed, PA-C [facility] [phone] 10/02/2019 7:51 AM  Feels go.  Transferred already this morning.  PT OT eval ok for rehab at anytime from our standpoint  Fabienne Bruns, MD [facility] [place] Office: [phone]

## 2019-10-02 NOTE — Plan of Care (Signed)
  Problem: Education: Goal: Knowledge of General Education information will improve Description Including pain rating scale, medication(s)/side effects and non-pharmacologic comfort measures Outcome: Progressing   Problem: Health Behavior/Discharge Planning: Goal: Ability to manage health-related needs will improve Outcome: Progressing   

## 2019-10-02 NOTE — Progress Notes (Signed)
Inpatient Rehabilitation Admissions Coordinator  Inpatient rehab consult received. I met with patient at bedside for rehab assessment. Her daughter had previously come from [place] and stayed with her for 3 months to assist. Daughter recently returned to [place] , but patient states she is to arrive back to [facility] in next 24 hrs.It is doubtful that [facility] Medicare will approve an inpt rehab admit at her current functional level and for her amputation. Patient to discuss with daughter tonight and I will follow up tomorrow. I feel she can return home at d/c if daughter to be available initially for a few weeks.  Danne Baxter, RN, MSN Rehab Admissions Coordinator [phone] 10/02/2019 1:12 PM

## 2019-10-02 NOTE — Plan of Care (Signed)
  Problem: Clinical Measurements: Goal: Respiratory complications will improve Outcome: Progressing   Problem: Coping: Goal: Level of anxiety will decrease Outcome: Progressing   

## 2019-10-02 NOTE — Evaluation (Signed)
Physical Therapy Evaluation Patient Details Name: Mercedes [name] MRN: [id number] DOB: July 08, 1956 Today's Date: 10/02/2019   History of Present Illness  Patient is a 64 y/o female who presents s/p New left femoral to below-knee popliteal 6 mm ringed Gore-Tex graft after a failed thrombectomy 1/28. 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  Clinical Impression  Patient presents s/p L AKA with increased pain and decreased activity tolerance with decreased ROM L LE and limited knowledge of care for her new amputation.  Currently minguard for ambulation, min A for transfers.  She lives alone and seems reluctant to ask her daughter to stay as she just finished a 3 month stay with her mom.  Patient mobilizing well, but feel she may achieve more independent level with short CIR level rehab stay.  Patient feels more likely she can arrange intermittent help once her daughter leaves than 24 hour assist.  PT to follow acutely.  If pt decides to go home will need HHPT, HH aide.      Follow Up Recommendations CIR;Supervision - Intermittent    Equipment Recommendations  None recommended by PT    Recommendations for Other Services Rehab consult     Precautions / Restrictions Precautions Precautions: Fall Precaution Comments: new L AKA Restrictions Weight Bearing Restrictions: Yes LLE Weight Bearing: Non weight bearing      Mobility  Bed Mobility Overal bed mobility: Needs Assistance Bed Mobility: Supine to Sit     Supine to sit: Supervision     General bed mobility comments: increased time, unaware she kept L LE flexed throughout  Transfers Overall transfer level: Needs assistance Equipment used: Rolling walker (2 wheeled) Transfers: Sit to/from Stand Sit to Stand: Min assist         General transfer comment: cues for hand placement, assist for balance  Ambulation/Gait Ambulation/Gait assistance: Supervision;Min guard Gait Distance (Feet): 25 Feet(& 20') Assistive device: Rolling walker (2  wheeled) Gait Pattern/deviations: Step-to pattern     General Gait Details: hop to pattern steady with RW, cues to keep walker on ground as pt picking up to move it.  Stairs            Wheelchair Mobility    Modified Rankin (Stroke Patients Only)       Balance Overall balance assessment: Needs assistance Sitting-balance support: Feet supported;No upper extremity supported Sitting balance-Leahy Scale: Good Sitting balance - Comments: supervision   Standing balance support: Bilateral upper extremity supported Standing balance-Leahy Scale: Poor Standing balance comment: reliant on external support due to L AKA                             Pertinent Vitals/Pain Pain Assessment: Faces Faces Pain Scale: Hurts whole lot Pain Location: L residual limb Pain Descriptors / Indicators: Sore;Aching;Guarding Pain Intervention(s): Monitored during session;Repositioned;Other (comment);Premedicated before session(re-wrapped leg)    Home Living Family/patient expects to be discharged to:: Private residence Living Arrangements: Alone Available Help at Discharge: Family;Available PRN/intermittently Type of Home: House Home Access: Stairs to enter Entrance Stairs-Rails: None Entrance Stairs-Number of Steps: 2 Home Layout: One level Home Equipment: Walker - 2 wheels;Cane - single point;Tub bench;Bedside commode;Hand held shower head;Adaptive equipment;Wheelchair - manual      Prior Function Level of Independence: Independent with assistive device(s)         Comments: Uses RW for ambulation. Cares for self. Drives.  Daughter from [place] stayed with her 3 months Nov-Jan to help     Hand Dominance  Extremity/Trunk Assessment   Upper Extremity Assessment Upper Extremity Assessment: Defer to OT evaluation    Lower Extremity Assessment Lower Extremity Assessment: LLE deficits/detail LLE Deficits / Details: L AKA keeping flexed at hip, in standing can  extend to about -25 degrees with cues and increased time LLE Sensation: (phantom sensations in leg)       Communication   Communication: No difficulties  Cognition Arousal/Alertness: Awake/alert Behavior During Therapy: WFL for tasks assessed/performed Overall Cognitive Status: Within Functional Limits for tasks assessed                                        General Comments General comments (skin integrity, edema, etc.): VSS with in room ambulation. Seated at sink to brush teeth, wash face with OT    Exercises Amputee Exercises Gluteal Sets: 5 reps;Left;Supine;AROM Towel Squeeze: AROM;Strengthening;Both(performed x 1 then stopped due to increased pain) Hip Extension: AROM;Left;Standing(worked on getting L LE extended to neutral in standing)   Assessment/Plan    PT Assessment Patient needs continued PT services  PT Problem List Pain;Decreased knowledge of precautions;Decreased coordination;Decreased balance;Decreased knowledge of use of DME;Decreased mobility;Decreased activity tolerance       PT Treatment Interventions DME instruction;Stair training;Therapeutic activities;Balance training;Gait training;Functional mobility training;Therapeutic exercise;Patient/family education    PT Goals (Current goals can be found in the Care Plan section)  Acute Rehab PT Goals Patient Stated Goal: to go home PT Goal Formulation: With patient Time For Goal Achievement: 10/16/19 Potential to Achieve Goals: Good    Frequency Min 3X/week   Barriers to discharge Decreased caregiver support lives alone, more confident she can get intermittent help at home    Co-evaluation PT/OT/SLP Co-Evaluation/Treatment: Yes Reason for Co-Treatment: To address functional/ADL transfers PT goals addressed during session: Mobility/safety with mobility;Balance;Proper use of DME OT goals addressed during session: ADL's and self-care       AM-PAC PT "6 Clicks" Mobility  Outcome Measure  Help needed turning from your back to your side while in a flat bed without using bedrails?: None Help needed moving from lying on your back to sitting on the side of a flat bed without using bedrails?: None Help needed moving to and from a bed to a chair (including a wheelchair)?: A Little Help needed standing up from a chair using your arms (e.g., wheelchair or bedside chair)?: A Little Help needed to walk in hospital room?: A Little Help needed climbing 3-5 steps with a railing? : A Little 6 Click Score: 20    End of Session Equipment Utilized During Treatment: Gait belt Activity Tolerance: Patient tolerated treatment well Patient left: in chair;with call bell/phone within reach   PT Visit Diagnosis: Difficulty in walking, not elsewhere classified (R26.2);Pain Pain - Right/Left: Left Pain - part of body: Leg    Time: 6144-3154 PT Time Calculation (min) (ACUTE ONLY): 40 min   Charges:   PT Evaluation $PT Re-evaluation: 1 Re-eval PT Treatments $Gait Training: 8-22 mins        Magda Kiel, PT Acute Rehabilitation Services [phone] 10/02/2019   Reginia Naas 10/02/2019, 1:50 PM

## 2019-10-02 NOTE — Progress Notes (Signed)
Occupational Therapy Treatment Patient Details Name: Mercedes [name] MRN: [id number] DOB: Mar 06, 1956 Today's Date: 10/02/2019    History of present illness Patient is a 64 y/o female who presents s/p New left femoral to below-knee popliteal 6 mm ringed Gore-Tex graft after a failed thrombectomy 1/28. 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 Patient is modified independent with bed mobility, min A with sit to stand min cues for hand placement and min guard for functional ambulation using walker. Patient requires min cues for safety awareness during ambulation managing obstacles in the room and to push walker vs picking it up when taking a step. Discuss discharge recommendations with patient and benefits of CIR stay to maximize patient endurance, balance, safety with ADL and IADL tasks as patient typically lives alone. If patient not accepted to CIR would recommend 24/7 supervision at home initially for support for IADL tasks, ect.    Follow Up Recommendations  CIR;Home health OT;Supervision/Assistance - 24 hour;Other (comment)(HH if pt can arrange 24/7 support)    Equipment Recommendations  Other (comment)(defer to next venue)       Precautions / Restrictions Precautions Precautions: Fall Precaution Comments: new L AKA       Mobility Bed Mobility Overal bed mobility: Modified Independent Bed Mobility: Supine to Sit     Supine to sit: Modified independent (Device/Increase time);HOB elevated        Transfers Overall transfer level: Needs assistance Equipment used: Rolling walker (2 wheeled);None Transfers: Sit to/from Stand Sit to Stand: Min assist         General transfer comment: min A to power up with min cues for body mechanics    Balance Overall balance assessment: Needs assistance Sitting-balance support: No upper extremity supported;Feet unsupported Sitting balance-Leahy Scale: Good Sitting balance - Comments: supervision    Standing balance support: Bilateral upper extremity supported;During functional activity Standing balance-Leahy Scale: Poor Standing balance comment: reliant on external support                            ADL either performed or assessed with clinical judgement   ADL Overall ADL's : Needs assistance/impaired     Grooming: Wash/dry face;Wash/dry hands;Oral care;Set up;Sitting               Lower Body Dressing: Set up;Bed level Lower Body Dressing Details (indicate cue type and reason): patient don R sock in long sitting without physical assist Toilet Transfer: Min guard;Ambulation;RW;BSC Toilet Transfer Details (indicate cue type and reason): simulated with recliner transfer, min guard and min cues for safety         Functional mobility during ADLs: Min guard;Rolling walker;Cueing for safety                 Cognition Arousal/Alertness: Awake/alert Behavior During Therapy: WFL for tasks assessed/performed Overall Cognitive Status: Within Functional Limits for tasks assessed                                                     Pertinent Vitals/ Pain       Pain Assessment: Faces Faces Pain Scale: Hurts whole lot Pain Location: L residual limb Pain Descriptors / Indicators: Sore;Aching;Guarding Pain Intervention(s): Premedicated before session  Frequency  Min 2X/week        Progress Toward Goals  OT Goals(current goals can now be found in the care plan section)  Progress towards OT goals: Progressing toward goals  Acute Rehab OT Goals Patient Stated Goal: to go home OT Goal Formulation: With patient Time For Goal Achievement: 10/10/19 Potential to Achieve Goals: Good ADL Goals Pt Will Perform Grooming: with modified independence;sitting;standing Pt Will Perform Lower Body Bathing: with modified independence;sitting/lateral leans;sit to/from stand Pt Will Perform Lower Body Dressing: with modified  independence;sitting/lateral leans;sit to/from stand Pt Will Transfer to Toilet: with modified independence;ambulating;bedside commode Pt Will Perform Toileting - Clothing Manipulation and hygiene: with modified independence;sitting/lateral leans;sit to/from stand  Plan Discharge plan needs to be updated    Co-evaluation    PT/OT/SLP Co-Evaluation/Treatment: Yes Reason for Co-Treatment: To address functional/ADL transfers   OT goals addressed during session: ADL's and self-care      AM-PAC OT "6 Clicks" Daily Activity     Outcome Measure   Help from another person eating meals?: None Help from another person taking care of personal grooming?: A Little Help from another person toileting, which includes using toliet, bedpan, or urinal?: A Little Help from another person bathing (including washing, rinsing, drying)?: A Little Help from another person to put on and taking off regular upper body clothing?: A Little Help from another person to put on and taking off regular lower body clothing?: A Little 6 Click Score: 19    End of Session Equipment Utilized During Treatment: Gait belt;Rolling walker  OT Visit Diagnosis: Other abnormalities of gait and mobility (R26.89);Pain Pain - Right/Left: Left Pain - part of body: Leg   Activity Tolerance Patient tolerated treatment well   Patient Left in chair;with call bell/phone within reach   Nurse Communication Mobility status        Time: 2446-2863 OT Time Calculation (min): 41 min  Charges: OT General Charges $OT Visit: 1 Visit OT Treatments $Self Care/Home Management : 8-22 mins  Myrtie Neither OT OT office: [phone]   Carmelia Roller 10/02/2019, 12:56 PM

## 2019-10-02 NOTE — Anesthesia Postprocedure Evaluation (Signed)
Anesthesia Post Note  Patient: Mercedes [name]  Procedure(s) Performed: LEFT AMPUTATION ABOVE KNEE (Left Knee)     Patient location during evaluation: PACU Anesthesia Type: General Level of consciousness: awake and alert Pain management: pain level controlled Vital Signs Assessment: post-procedure vital signs reviewed and stable Respiratory status: spontaneous breathing, nonlabored ventilation and respiratory function stable Cardiovascular status: blood pressure returned to baseline and stable Postop Assessment: no apparent nausea or vomiting Anesthetic complications: no    Last Vitals:  Vitals:   10/02/19 0802 10/02/19 1036  BP:    Pulse: 71 70  Resp: 17 19  Temp:    SpO2: 98% 99%    Last Pain:  Vitals:   10/02/19 1135  TempSrc:   PainSc: 2                  Lucretia Kern

## 2019-10-03 LAB — CBC
HCT: 34.6 % — ABNORMAL LOW (ref 36.0–46.0)
Hemoglobin: 10.6 g/dL — ABNORMAL LOW (ref 12.0–15.0)
MCH: 24 pg — ABNORMAL LOW (ref 26.0–34.0)
MCHC: 30.6 g/dL (ref 30.0–36.0)
MCV: 78.3 fL — ABNORMAL LOW (ref 80.0–100.0)
Platelets: 578 10*3/uL — ABNORMAL HIGH (ref 150–400)
RBC: 4.42 MIL/uL (ref 3.87–5.11)
RDW: 15.7 % — ABNORMAL HIGH (ref 11.5–15.5)
WBC: 11.1 10*3/uL — ABNORMAL HIGH (ref 4.0–10.5)
nRBC: 0 % (ref 0.0–0.2)

## 2019-10-03 LAB — BASIC METABOLIC PANEL
Anion gap: 9 (ref 5–15)
BUN: 9 mg/dL (ref 8–23)
CO2: 25 mmol/L (ref 22–32)
Calcium: 9 mg/dL (ref 8.9–10.3)
Chloride: 102 mmol/L (ref 98–111)
Creatinine, Ser: 0.69 mg/dL (ref 0.44–1.00)
GFR calc Af Amer: >60 mL/min (ref >60)
GFR calc non Af Amer: >60 mL/min (ref >60)
Glucose, Bld: 93 mg/dL (ref 70–99)
Potassium: 4 mmol/L (ref 3.5–5.1)
Sodium: 136 mmol/L (ref 135–145)

## 2019-10-03 NOTE — Progress Notes (Addendum)
  Progress Note    10/03/2019 7:42 AM 2 Days Post-Op  Subjective:  Some pain over night in Left AKA stump   Vitals:   10/02/19 2324 10/03/19 0435  BP: (!) 143/75 (!) 153/84  Pulse: (!) 55 73  Resp: 16 19  Temp: 98.1 F (36.7 C) 98.6 F (37 C)  SpO2: 97% 99%   Physical Exam: Lungs:  nonlabored Incisions:  Left bka stump clean, dry and intact. Mild tenderness, no fluid collections or drainage. Left groin incision intact, minimal tenderness. No swelling, no ecchymosis or erythema Extremities:  3+ left femoral pulse   CBC    Component Value Date/Time   WBC 11.1 (H) 10/03/2019 0656   RBC 4.42 10/03/2019 0656   HGB 10.6 (L) 10/03/2019 0656   HGB 14.2 10/09/2018 1125   HCT 34.6 (L) 10/03/2019 0656   HCT 44.6 10/09/2018 1125   PLT 578 (H) 10/03/2019 0656   PLT 289 10/09/2018 1125   MCV 78.3 (L) 10/03/2019 0656   MCV 79 10/09/2018 1125   MCH 24.0 (L) 10/03/2019 0656   MCHC 30.6 10/03/2019 0656   RDW 15.7 (H) 10/03/2019 0656   RDW 14.8 10/09/2018 1125   LYMPHSABS 2.5 05/22/2019 1816   MONOABS 0.6 05/22/2019 1816   EOSABS 0.4 05/22/2019 1816   BASOSABS 0.1 05/22/2019 1816    BMET    Component Value Date/Time   NA 136 10/03/2019 0656   NA 135 10/09/2018 1125   K 4.0 10/03/2019 0656   CL 102 10/03/2019 0656   CO2 25 10/03/2019 0656   GLUCOSE 93 10/03/2019 0656   BUN 9 10/03/2019 0656   BUN 10 10/09/2018 1125   CREATININE 0.69 10/03/2019 0656   CREATININE 1.13 (H) 08/03/2016 1440   CALCIUM 9.0 10/03/2019 0656   GFRNONAA >60 10/03/2019 0656   GFRAA >60 10/03/2019 0656    INR    Component Value Date/Time   INR 1.1 10/01/2019 0328     Intake/Output Summary (Last 24 hours) at 10/03/2019 0742 Last data filed at 10/03/2019 0245 Gross per 24 hour  Intake --  Output 1200 ml  Net -1200 ml     Assessment/Plan:  64 y.o. female is s/p left above knee amputation 2 Days Post-Op  - left above knee amputation stump dressings changed. Stump looks good - continue  PT/OT - Likely not a candidate for CIR. Possibly to d/c home with home health/ HHPT if her daughter is able to care for her. CM and patient waiting discussion with daughter to determine home other options  DVT prophylaxis:  Sq heparin   Graceann Congress, [place] [facility] [phone] 10/03/2019 7:42 AM   Agree with above.  Pain improving.  She is ready for d/c.  Apparently planning in the works from [employer] and daughter.  Fabienne Bruns, MD [facility] [name] Office: [phone]

## 2019-10-03 NOTE — Progress Notes (Signed)
Physical Therapy Treatment Patient Details Name: Mercedes [name] MRN: [id number] DOB: [birth year] Today's Date: 10/03/2019    History of Present Illness Patient is a 64 y/o female who presents s/p New left femoral to below-knee popliteal 6 mm ringed Gore-Tex graft after a failed thrombectomy 1/28. 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    PT Comments    Patient progressing slowly towards PT goals. Mobility limited today secondary to pt feeling lightheaded and weak. Pt with drop in BP with mobility. Sitting BP post short walk 87/68, sitting BP after 3 minutes 81/54, supine BP 96/64, symptomatic. Requires Min A-Min guard for transfers and gait training today. Educated pt re: desensitization techniques for left residual limb, exercises to improve hip extension and positioning. Discharge recommendation updated to home with HHPT as pt plans to have assist from daughter. Pt reports having a w/c. Will follow.   Follow Up Recommendations  Home health PT;Supervision for mobility/OOB     Equipment Recommendations  None recommended by PT    Recommendations for Other Services       Precautions / Restrictions Precautions Precautions: Fall Precaution Comments: watch BP Restrictions Weight Bearing Restrictions: Yes LLE Weight Bearing: Non weight bearing    Mobility  Bed Mobility Overal bed mobility: Needs Assistance Bed Mobility: Sit to Supine     Supine to sit: Supervision Sit to supine: Min assist;HOB elevated   General bed mobility comments: Assist needed to bring left residual limb into bed.  Transfers Overall transfer level: Needs assistance Equipment used: Rolling walker (2 wheeled) Transfers: Sit to/from Stand Sit to Stand: Min assist         General transfer comment: Assist to power to standing with good placement of hands; stood from chair x2, from [facility] x1, transferred back to bed due to feeling lightheaded and low BP.  Ambulation/Gait Ambulation/Gait assistance: Min guard;Min  assist Gait Distance (Feet): 18 Feet(+ 6') Assistive device: Rolling walker (2 wheeled) Gait Pattern/deviations: Step-to pattern Gait velocity: decreased Gait velocity interpretation: <1.8 ft/sec, indicate of risk for recurrent falls General Gait Details: hop to pattern with occasional Min A due to feeling lightheaded and weak with RW. Cues to extend LLE.   Stairs             Wheelchair Mobility    Modified Rankin (Stroke Patients Only)       Balance Overall balance assessment: Needs assistance Sitting-balance support: Feet supported;No upper extremity supported Sitting balance-Leahy Scale: Good Sitting balance - Comments: supervision   Standing balance support: During functional activity Standing balance-Leahy Scale: Poor Standing balance comment: reliant on external support for pericare and Min guard-Min A                            Cognition Arousal/Alertness: Awake/alert Behavior During Therapy: WFL for tasks assessed/performed Overall Cognitive Status: Within Functional Limits for tasks assessed                                        Exercises Amputee Exercises Hip Extension: AROM;Left;Standing    General Comments General comments (skin integrity, edema, etc.): VSS.      Pertinent Vitals/Pain Pain Assessment: Faces Pain Score: 4  Faces Pain Scale: Hurts little more Pain Location: Lft residual limb Pain Descriptors / Indicators: Guarding;Grimacing;Sore Pain Intervention(s): Monitored during session;Repositioned;Premedicated before session    Home Living  Prior Function            PT Goals (current goals can now be found in the care plan section) Acute Rehab PT Goals Patient Stated Goal: to go home Progress towards PT goals: Progressing toward goals(slowly)    Frequency    Min 3X/week      PT Plan Discharge plan needs to be updated    Co-evaluation       OT goals addressed  during session: ADL's and self-care      AM-PAC PT "6 Clicks" Mobility   Outcome Measure  Help needed turning from your back to your side while in a flat bed without using bedrails?: None Help needed moving from lying on your back to sitting on the side of a flat bed without using bedrails?: None Help needed moving to and from a bed to a chair (including a wheelchair)?: A Little Help needed standing up from a chair using your arms (e.g., wheelchair or bedside chair)?: A Little Help needed to walk in hospital room?: A Little Help needed climbing 3-5 steps with a railing? : A Lot 6 Click Score: 19    End of Session Equipment Utilized During Treatment: Gait belt Activity Tolerance: Treatment limited secondary to medical complications (Comment)(drop in BP) Patient left: in bed;with call bell/phone within reach;with bed alarm set Nurse Communication: Mobility status;Other (comment)(student nurse present and aware of BP drop) PT Visit Diagnosis: Difficulty in walking, not elsewhere classified (R26.2);Pain Pain - Right/Left: Left Pain - part of body: Leg     Time: 0539-7673 PT Time Calculation (min) (ACUTE ONLY): 25 min  Charges:  $Gait Training: 8-22 mins $Therapeutic Activity: 8-22 mins                     Vale Haven, PT, DPT Acute Rehabilitation Services Pager [phone] Office [phone]       [name] 10/03/2019, 3:26 PM

## 2019-10-03 NOTE — Progress Notes (Signed)
Occupational Therapy Treatment Patient Details Name: Mercedes [name] MRN: [id number] DOB: [birth year] Today's Date: 10/03/2019    History of present illness Patient is a 64 y/o female who presents s/p New left femoral to below-knee popliteal 6 mm ringed Gore-Tex graft after a failed thrombectomy 1/28. 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   OT comments  Patient continuing to make steady progress towards goals during skilled OT session. Session consisted of simulated toilet transfer with appropriate equipment, reiteration of education in regards to AE equipment, and working through upper level IADL skills in preparation of discharge home. Pt remains at supervision for bed mobility, and required min verbal cues to complete transfer (Sit to stand) for hand placement and sequencing. Client able to verbalize appropriately how she will ambulate around her house to complete various ADL tasks, however continues to require reinforcement for IADL skills. Due to family support now being available, therapist now recommending home health OT in order to ensure ability to complete upper IADL tasks. Will continue to follow acutely to increase towards prior level of function.    Follow Up Recommendations  Home health OT    Equipment Recommendations  None recommended by OT    Recommendations for Other Services      Precautions / Restrictions Precautions Precautions: Fall Precaution Comments: new L AKA Restrictions Weight Bearing Restrictions: No LLE Weight Bearing: Non weight bearing       Mobility Bed Mobility Overal bed mobility: Needs Assistance Bed Mobility: Supine to Sit     Supine to sit: Supervision Sit to supine: Min guard      Transfers Overall transfer level: Needs assistance Equipment used: Rolling walker (2 wheeled) Transfers: Sit to/from Stand Sit to Stand: Min assist         General transfer comment: min verbal cues for hand placement, min A for inital balance to transition to  standing    Balance Overall balance assessment: Needs assistance Sitting-balance support: Feet supported;No upper extremity supported Sitting balance-Leahy Scale: Good Sitting balance - Comments: supervision   Standing balance support: Bilateral upper extremity supported Standing balance-Leahy Scale: Poor Standing balance comment: reliant on external support due to L AKA                           ADL either performed or assessed with clinical judgement   ADL Overall ADL's : Needs assistance/impaired Eating/Feeding: Independent;Sitting                       Toilet Transfer: Min guard;Ambulation;RW;BSC Toilet Transfer Details (indicate cue type and reason): simulated with recliner transfer, min guard and min cues for safety         Functional mobility during ADLs: Min guard;Rolling walker;Cueing for safety General ADL Comments: Confirmed A/E at home since family (daughter) will now be present upon discharge, pt does not have a reacher at home despite previous report     Vision       Perception     Praxis      Cognition Arousal/Alertness: Awake/alert Behavior During Therapy: WFL for tasks assessed/performed Overall Cognitive Status: Within Functional Limits for tasks assessed                                          Exercises     Shoulder Instructions       General Comments  Pertinent Vitals/ Pain       Pain Assessment: 0-10 Pain Score: 4  Pain Descriptors / Indicators: Guarding;Grimacing;Sore Pain Intervention(s): Monitored during session;Repositioned  Home Living                                          Prior Functioning/Environment              Frequency  Min 2X/week        Progress Toward Goals  OT Goals(current goals can now be found in the care plan section)  Progress towards OT goals: Progressing toward goals  Acute Rehab OT Goals Patient Stated Goal: to go home OT Goal  Formulation: With patient Time For Goal Achievement: 10/10/19 Potential to Achieve Goals: Good  Plan Discharge plan needs to be updated    Co-evaluation          OT goals addressed during session: ADL's and self-care      AM-PAC OT "6 Clicks" Daily Activity     Outcome Measure   Help from another person eating meals?: None Help from another person taking care of personal grooming?: A Little Help from another person toileting, which includes using toliet, bedpan, or urinal?: A Little Help from another person bathing (including washing, rinsing, drying)?: A Little Help from another person to put on and taking off regular upper body clothing?: A Little Help from another person to put on and taking off regular lower body clothing?: A Little 6 Click Score: 19    End of Session Equipment Utilized During Treatment: Gait belt;Rolling walker  OT Visit Diagnosis: Other abnormalities of gait and mobility (R26.89);Pain Pain - Right/Left: Left Pain - part of body: Leg   Activity Tolerance Patient tolerated treatment well   Patient Left in chair;with call bell/phone within reach   Nurse Communication          Time: 1230-1250 OT Time Calculation (min): 20 min  Charges: OT General Charges $OT Visit: 1 Visit OT Treatments $Self Care/Home Management : 8-22 mins  Corinne Ports E. Maebel Marasco COTA/L [phone] [place] 10/03/2019, 1:55 PM

## 2019-10-03 NOTE — TOC Initial Note (Signed)
Transition of Care (TOC) - Initial/Assessment Note  Donn Pierini RN, BSN Transitions of Care Unit 4E- RN Case Manager [phone]   Patient Details  Name: Mercedes [name] MRN: [id number] Date of Birth: [birth year]  Transition of Care [facility]) CM/SW Contact:    Darrold Span, RN Phone Number: 10/03/2019, 4:36 PM  Clinical Narrative:                 Pt s/p L AKA, mobilizing well post op, CIR consulted however plan for pt to return home, daughter has come from [place] to assist pt at discharge. PT/OT have updated recommendations to [facility]- no DM needs- CM spoke with pt at bedside (pt was sleeping)- confirmed daughter is here from [place] and will assist- pt has all needed DME at home. List provided to pt for [facility] choice- she request time to look at it when she is more awake- will need HH orders placed for HHPT/OT- will have weekend [facility] staff f/u for pt choice.   Expected Discharge Plan: Home w Home Health Services Barriers to Discharge: Continued Medical Work up   Patient Goals and CMS Choice Patient states their goals for this hospitalization and ongoing recovery are:: return home CMS Medicare.gov Compare Post Acute Care list provided to:: Patient Choice offered to / list presented to : Patient  Expected Discharge Plan and Services Expected Discharge Plan: Home w Home Health Services   Discharge Planning Services: CM Consult Post Acute Care Choice: Home Health                   DME Arranged: N/A DME Agency: NA       HH Arranged: OT, PT          Prior Living Arrangements/Services   Lives with:: Self(daughter here from [place] to stay with pt post discharge) Patient language and need for interpreter reviewed:: Yes Do you feel safe going back to the place where you live?: Yes      Need for Family Participation in Patient Care: Yes (Comment) Care giver support system in place?: Yes (comment) Current home services: DME Criminal Activity/Legal Involvement Pertinent to Current  Situation/Hospitalization: No - Comment as needed  Activities of Daily Living Home Assistive Devices/Equipment: Environmental consultant (specify type), Wheelchair, Shower chair with back, Hand-held shower hose, Blood pressure cuff, Dentures (specify type), Eyeglasses ADL Screening (condition at time of admission) Patient's cognitive ability adequate to safely complete daily activities?: Yes Is the patient deaf or have difficulty hearing?: No Does the patient have difficulty seeing, even when wearing glasses/contacts?: No Does the patient have difficulty concentrating, remembering, or making decisions?: No Patient able to express need for assistance with ADLs?: Yes Does the patient have difficulty dressing or bathing?: No Independently performs ADLs?: No Communication: Independent Dressing (OT): Needs assistance Is this a change from baseline?: Pre-admission baseline Grooming: Independent Feeding: Independent Bathing: Needs assistance Is this a change from baseline?: Pre-admission baseline Toileting: Independent In/Out Bed: Independent Walks in Home: Independent with device (comment) Does the patient have difficulty walking or climbing stairs?: Yes Weakness of Legs: Both Weakness of Arms/Hands: None  Permission Sought/Granted Permission sought to share information with : Facility Industrial/product designer granted to share information with : Yes, Verbal Permission Granted              Emotional Assessment Appearance:: Appears stated age Attitude/Demeanor/Rapport: Engaged Affect (typically observed): Appropriate Orientation: : Oriented to Self, Oriented to Place, Oriented to  Time, Oriented to Situation   Psych Involvement: No (comment)  Admission diagnosis:  Femoral-popliteal bypass graft occlusion, left ([place]) [T82.898A] Peripheral arterial disease ([place]) [I73.9] Patient Active Problem List   Diagnosis Date Noted  . Femoral-popliteal bypass graft occlusion, left ([place]) 09/25/2019  .  Peripheral arterial disease ([place]) 09/25/2019  . History of transmetatarsal amputation of left foot ([place]) 07/17/2019  . Gangrene of left foot ([place])   . Protein-calorie malnutrition, severe 05/15/2019  . Critical lower limb ischemia 05/09/2019  . Dyspnea 02/01/2019  . Weight loss, unintentional 02/01/2019  . Epigastric pain 02/01/2019  . 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 Acute pyelonephritis 10/20/2018  . Acute renal failure superimposed on stage 2 chronic kidney disease ([place]) 10/20/2018  . Diabetes ([place]) 04/01/2017  . Hyperthyroidism 02/21/2017  . Low mean corpuscular volume (MCV) 09/30/2016  . Coronary artery disease involving native heart without angina pectoris   . Hx of CABG 07/17/2016  . Paroxysmal atrial fibrillation ([place]) 07/12/2016  . NSTEMI (non-ST elevated myocardial infarction) ([place]) 07/11/2016  . History of arterial bypass of lower extremity 12/30/2015  . Claudication of right lower extremity ([place]) 12/30/2015  . Phantom limb pain-Left great toe 04/29/2015  . Seizure ([place])   . Atrial flutter with rapid ventricular response vs. SVT 10/27/2014    Class: Acute  . Atherosclerosis of native artery of left lower extremity with rest pain ([place]) 12/18/2013  . PAD (peripheral artery disease) ([place]) 12/02/2013  . SLE (systemic lupus erythematosus) ([place])   . CAD - S/P Takotsubo MI in 2000, CFX DES Sept 2014 05/25/2013  . Acute MI, inferior wall ([place]) 05/19/2013  . 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 Tachycardia-bradycardia ([place]) 03/09/2010  . PVD- s/p Lt [facility] April 2015 10/29/2009  . Dyslipidemia 08/26/2009  . Essential hypertension 08/26/2009  . CORONARY ATHEROSCLEROSIS NATIVE CORONARY ARTERY 08/26/2009  . 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 Rheumatoid arthritis ([place]) 08/26/2009   PCP:  Binnie Rail, MD Pharmacy:   [facility] [place], [place]  [address] Phone: [phone] Fax: [phone]     Social Determinants of Health (SDOH) Interventions    Readmission Risk Interventions Readmission Risk Prevention Plan 05/16/2019  Transportation Screening Complete  PCP or Specialist Appt within 5-7 Days Complete  Home Care Screening Complete  Medication Review (RN CM) Complete  Some recent data might be hidden

## 2019-10-03 NOTE — Care Management Important Message (Signed)
Important Message  Patient Details  Name: RAYA MCKINSTRY MRN: [id number] Date of Birth: 07/18/1956   Medicare Important Message Given:  Yes     [name] 10/03/2019, 11:26 AM

## 2019-10-03 NOTE — Progress Notes (Signed)
Inpatient Rehabilitation Admissions Coordinator  I met with patient and spoke with her daughter by phone. Daughter arrived form [place] to [place] last night and can provide 24/7 assist at home. I spoke with daughter by phone and she is prepared for patient to d/c home with [facility] and needed DME. I will alert RN CM, Kristi. We will sign off at [facility] time.  Danne Baxter, RN, MSN Rehab Admissions Coordinator [phone] 10/03/2019 10:54 AM

## 2019-10-04 MED ORDER — APIXABAN 5 MG PO TABS
5.0000 mg | ORAL_TABLET | Freq: Two times a day (BID) | ORAL | Status: DC
  Administered 2019-10-04 – 2019-10-06 (×5): 5 mg via ORAL
  Filled 2019-10-04 (×5): qty 1

## 2019-10-04 NOTE — Progress Notes (Signed)
Pt has no IV access. Per day shift RN report MD aware and ok with it. I asked Patient I can start one in case we need to use it, but patient refused. Will continue to monitor.

## 2019-10-04 NOTE — Progress Notes (Addendum)
ANTICOAGULATION CONSULT NOTE - Initial Consult  Pharmacy Consult for Apixaban Indication: atrial fibrillation/Aflutter  Allergies  Allergen Reactions  . Brilinta [Ticagrelor] Anaphylaxis and Other (See Comments)    Also, chest tightness  . Latex Rash  . Tape Rash    Patient Measurements: Height: 5\' 6"  (167.6 cm) Weight: 110 lb (49.9 kg) IBW/kg (Calculated) : 59.3  Vital Signs: Temp: 98 F (36.7 C) (02/06 0821) Temp Source: Oral (02/06 0821) BP: 131/80 (02/06 0821) Pulse Rate: 76 (02/06 0821)  Labs: Recent Labs    10/02/19 0219 10/03/19 0656  HGB 10.4* 10.6*  HCT 33.5* 34.6*  PLT 582* 578*  CREATININE 0.78 0.69    Estimated Creatinine Clearance: 55.2 mL/min (by C-G formula based on SCr of 0.69 mg/dL).   Medical History: Past Medical History:  Diagnosis Date  . Abnormal LFTs    a. in the past when on Lipitor  . Anemia    as a young woman  . Anxiety   . Arnold-Chiari malformation, type I ([place])    MRI brain 02/2010  . CAD (coronary artery disease)    a. NSTEMI 07/2009: LHC - D2 40%, LAD irreg., EF 50% with apical AK (tako-tsubo CM);  b. Inf STEMI (04/2013): Tx Promus DES to CFX;  c. 08/2013 Lexi CL: No ischemia, dist ant/ap/inf-ap infarct, EF  d. 06/2016 NSTEMI with LM disease: s/p CABG on 07/17/2016 w/ LIMA-LAD, SVG-OM, and SVG-dRCA.  [name] DEPRESSION   . Dizziness    ? CVA 01/2010 - carotid dopplers with no ICA stenosis  . GERD (gastroesophageal reflux disease)   . Headache   . HYPERLIPIDEMIA   . HYPERTENSION   . MI (myocardial infarction) ([place])   . PAD (peripheral artery disease) ([place])    s/p L fem pop 11/2013 in setting of 1st toe osteo/gangrene  . Paroxysmal atrial fibrillation (HCC)    a. anticoagulated with Eliquis 10/2014  . 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 RA (rheumatoid arthritis) ([place])    prior tx by Dr. Ouida Sills  . Sjogren's syndrome ([place]) 06/02/13   pt denies this (12/01/13)  . SLE (systemic lupus  erythematosus) ([place]) dx 05/2013   follows with rheum Ouida Sills)  . Tachy-brady syndrome (HCC)    a. s/p STJ Leadless pacemaker 11-04-2014 Dr Rayann Heman  . 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 Wears dentures   . Wears glasses     Medications:  Scheduled:  . apixaban  5 mg Oral BID  . cyclobenzaprine  10 mg Oral TID  . docusate sodium  100 mg Oral Daily  . DULoxetine  30 mg Oral Daily  . lisinopril  10 mg Oral Daily  . metoprolol tartrate  12.5 mg Oral BID  . pantoprazole  40 mg Oral Daily  . rosuvastatin  40 mg Oral q1800   Infusions:  . sodium chloride    . sodium chloride 50 mL/hr at 10/01/19 1644  . magnesium sulfate bolus IVPB      Assessment: Pt is a 64 y/o female who presented with evaluation of rest pain and critical ischemia of left foot. She has history of prior CABG in 2017, atrial flutter (on Eliquis PTA), and peripheral vascular occlusive disease. Pt has undergone left AKA. Today is POD #3 and pharmacy has been consulted to restart Eliquis.  Scr is 0.69, age is 56, and Wt is 49.9 kg. Last hg/Hct was stable at 10.6/34.6. POlts are  a bit elevated at 578.  Goal of Therapy:  Monitor platelets by anticoagulation protocol: Yes   Plan:  Apixaban 5 mg PO BID Monitor Scr for dose adjustments Monitor CBC periodically Watch for signs/symptoms of bleeding  Alvia Grove, PharmD PGY1 Acute Care Pharmacy Resident 10/04/2019,9:35 AM

## 2019-10-04 NOTE — Plan of Care (Signed)
Poc progressing.  

## 2019-10-04 NOTE — Progress Notes (Addendum)
[facility] [place]  Subjective  - Doing well and is trying to decode [facility] agency and plans for return home with daughters help.   Objective 131/80 76 98 F (36.7 C) (Oral) 18 97%  Intake/Output Summary (Last 24 hours) at 10/04/2019 0838 Last data filed at 10/03/2019 1439 Gross per 24 hour  Intake 200 ml  Output 600 ml  Net -400 ml    Left AKA healing well, ace wrap reapplied. Stump incision clean Lungs non labored breathing  Assessment/Planning: POD # 3 Left AKA  Will restart Eliquis today.  Stable post op with viable left AKA Pending discharge planning for home with daughter possibly Monday. WBC decreasing with time. Cont. PT/OT for mobility as needed. No equipment needs for discharge  Mosetta Pigeon 10/04/2019 8:38 AM -- Left AKA healing Discharge planning in progress Continue PT/OT ? Home Monday  Fabienne Bruns, MD [facility] [place] Office: [phone]  Laboratory Lab Results: Recent Labs    10/02/19 0219 10/03/19 0656  WBC 12.1* 11.1*  HGB 10.4* 10.6*  HCT 33.5* 34.6*  PLT 582* 578*   BMET Recent Labs    10/02/19 0219 10/03/19 0656  NA 135 136  K 4.4 4.0  CL 102 102  CO2 26 25  GLUCOSE 113* 93  BUN 10 9  CREATININE 0.78 0.69  CALCIUM 8.7* 9.0    COAG Lab Results  Component Value Date   INR 1.1 10/01/2019   INR 1.0 09/25/2019   INR 1.1 05/15/2019   No results found for: PTT

## 2019-10-05 NOTE — Progress Notes (Signed)
[facility] [place]  Subjective  - feels ok ready to go home   Objective 123/81 73 98 F (36.7 C) (Oral) 20 100%  Intake/Output Summary (Last 24 hours) at 10/05/2019 0928 Last data filed at 10/05/2019 0500 Gross per 24 hour  Intake 100 ml  Output 1100 ml  Net -1000 ml   Aka healing incision clean  Assessment/Planning: POD 4 left AKA Medically ready for d/c awaiting disposition  Fabienne Bruns 10/05/2019 9:28 AM --  Laboratory Lab Results: Recent Labs    10/03/19 0656  WBC 11.1*  HGB 10.6*  HCT 34.6*  PLT 578*   BMET Recent Labs    10/03/19 0656  NA 136  K 4.0  CL 102  CO2 25  GLUCOSE 93  BUN 9  CREATININE 0.69  CALCIUM 9.0    COAG Lab Results  Component Value Date   INR 1.1 10/01/2019   INR 1.0 09/25/2019   INR 1.1 05/15/2019   No results found for: PTT

## 2019-10-05 NOTE — Plan of Care (Signed)
Poc progressing.  

## 2019-10-06 LAB — SURGICAL PATHOLOGY

## 2019-10-06 MED ORDER — OXYCODONE HCL 5 MG PO TABS: 5.0000 mg | ORAL_TABLET | Freq: Four times a day (QID) | ORAL | 0 refills | Status: DC | PRN

## 2019-10-06 NOTE — Progress Notes (Signed)
Physical Therapy Treatment Patient Details Name: Mercedes [name] MRN: [id number] DOB: 01-08-1956 Today's Date: 10/06/2019    History of Present Illness Patient is a 64 y/o female who presents s/p New left femoral to below-knee popliteal 6 mm ringed Gore-Tex graft after a failed thrombectomy 1/28. 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    PT Comments    Pt motivated to ambulate this session, BP WFL during mobility except when pt first sat up there was + orthostatic. Pt educated on taking her time in each position to prevent lightheadedness/near syncope/syncope. Pt ambulated room distance with use of RW, with good form although continues to require cues for keeping LLE in neutral hip extension as opposed to in hip flexion. PT to continue to follow acutely.    Follow Up Recommendations  Home health PT;Supervision for mobility/OOB     Equipment Recommendations  None recommended by PT    Recommendations for Other Services       Precautions / Restrictions Precautions Precautions: Fall Restrictions Weight Bearing Restrictions: Yes LLE Weight Bearing: Non weight bearing    Mobility  Bed Mobility Overal bed mobility: Modified Independent Bed Mobility: Supine to Sit     Supine to sit: Modified independent (Device/Increase time) Sit to supine: Modified independent (Device/Increase time)   General bed mobility comments: Increased time and effort, use of bedrails.  Transfers Overall transfer level: Needs assistance Equipment used: Rolling walker (2 wheeled) Transfers: Sit to/from Stand Sit to Stand: Supervision         General transfer comment: supervision for safety, verbal cuing for hand placement when rising.  Ambulation/Gait Ambulation/Gait assistance: Min guard Gait Distance (Feet): 25 Feet Assistive device: Rolling walker (2 wheeled) Gait Pattern/deviations: Step-to pattern;Trunk flexed Gait velocity: decr   General Gait Details: min guard for safety, pt with appropriate hop-to  gait. Verbal cuing for upright posture, placement in RW, and keeping LLE in neutral hip extension.   Stairs             Wheelchair Mobility    Modified Rankin (Stroke Patients Only)       Balance Overall balance assessment: Needs assistance Sitting-balance support: Feet supported;No upper extremity supported Sitting balance-Leahy Scale: Good Sitting balance - Comments: supervision   Standing balance support: During functional activity Standing balance-Leahy Scale: Poor Standing balance comment: reliant on external support                            Cognition Arousal/Alertness: Awake/alert Behavior During Therapy: WFL for tasks assessed/performed Overall Cognitive Status: Within Functional Limits for tasks assessed                                        Exercises Amputee Exercises Gluteal Sets: Supine;AROM;10 reps;Both Hip Extension: Left;AROM;10 reps;Supine Hip ABduction/ADduction: AROM;Left;10 reps;Supine;AAROM    General Comments General comments (skin integrity, edema, etc.): BP sitting: 94/67(76), BP standing: 105/81 (90), BP post-ambulation: 128/79 (92)      Pertinent Vitals/Pain Pain Assessment: 0-10 Pain Score: 4  Pain Location: Lft residual limb, now having phantom pain Pain Descriptors / Indicators: Guarding;Grimacing;Sore;Aching Pain Intervention(s): Limited activity within patient's tolerance;Monitored during session;Repositioned    Home Living                      Prior Function            PT Goals (current  goals can now be found in the care plan section) Acute Rehab PT Goals Patient Stated Goal: to go home PT Goal Formulation: With patient Time For Goal Achievement: 10/16/19 Potential to Achieve Goals: Good Progress towards PT goals: Progressing toward goals    Frequency    Min 3X/week      PT Plan Discharge plan needs to be updated    Co-evaluation              AM-PAC PT "6 Clicks"  Mobility   Outcome Measure  Help needed turning from your back to your side while in a flat bed without using bedrails?: None Help needed moving from lying on your back to sitting on the side of a flat bed without using bedrails?: None Help needed moving to and from a bed to a chair (including a wheelchair)?: A Little Help needed standing up from a chair using your arms (e.g., wheelchair or bedside chair)?: A Little Help needed to walk in hospital room?: A Little Help needed climbing 3-5 steps with a railing? : A Lot 6 Click Score: 19    End of Session Equipment Utilized During Treatment: Gait belt Activity Tolerance: Patient tolerated treatment well Patient left: in bed;with call bell/phone within reach;with bed alarm set Nurse Communication: Mobility status PT Visit Diagnosis: Difficulty in walking, not elsewhere classified (R26.2);Pain Pain - Right/Left: Left Pain - part of body: Leg     Time: 0086-7619 PT Time Calculation (min) (ACUTE ONLY): 28 min  Charges:  $Gait Training: 8-22 mins $Therapeutic Exercise: 8-22 mins                     Mercedes [name] E, PT Acute Rehabilitation Services Pager [phone]  Office [phone]    Mercedes [name] D Mercedes [name] 10/06/2019, 5:16 PM

## 2019-10-06 NOTE — TOC Transition Note (Signed)
Transition of Care [facility]) - CM/SW Discharge Note Donn Pierini RN, BSN Transitions of Care Unit 4E- RN Case Manager [phone]   Patient Details  Name: Mercedes [name] MRN: [id number] Date of Birth: August 30, 1955  Transition of Care [facility]) CM/SW Contact:  Darrold Span, RN Phone Number: 10/06/2019, 3:09 PM   Clinical Narrative:    Pt stable for transition home today, f/u done with pt at bedside for [facility] choice per pt she would like to use [facility] as first choice if unable to accept then [facility] is her second choice, [facility] would be third choice. Pt again states she has all needed DME- her family will come provide transportation around 5pm this evening. Address, phone # and PCP all confirmed with pt in epic. Call made to [facility] with [facility] for [facility] referral- referral has been accepted for HHPT/OT.    Final next level of care: Home w Home Health Services Barriers to Discharge: No Barriers Identified, Barriers Resolved   Patient Goals and CMS Choice Patient states their goals for this hospitalization and ongoing recovery are:: return home CMS Medicare.gov Compare Post Acute Care list provided to:: Patient Choice offered to / list presented to : Patient  Discharge Placement           Home with [facility]            Discharge Plan and Services   Discharge Planning Services: CM Consult Post Acute Care Choice: Home Health          DME Arranged: N/A DME Agency: NA       HH Arranged: OT, PT HH Agency: [facility] Date [facility] Agency Contacted: 10/06/19 Time HH Agency Contacted: [phone] Representative spoke with at [facility] Agency: Kandee Keen  Social Determinants of Health (SDOH) Interventions     Readmission Risk Interventions Readmission Risk Prevention Plan 10/06/2019 05/16/2019  Transportation Screening Complete Complete  PCP or Specialist Appt within 5-7 Days Complete Complete  Home Care Screening Complete Complete  Medication Review (RN CM) Complete Complete  Some recent data  might be hidden

## 2019-10-06 NOTE — Care Management Important Message (Signed)
Important Message  Patient Details  Name: Mercedes [name] MRN: [id number] Date of Birth: 11-03-1955   Medicare Important Message Given:  Yes     [name] 10/06/2019, 3:53 PM

## 2019-10-06 NOTE — Discharge Summary (Signed)
[facility] Discharge Summary   Patient ID:  Mercedes [name] MRN: [id number] DOB/AGE: March 29, 1956 64 y.o.  Admit date: 09/25/2019 Discharge date: 10/06/2019 Date of Surgery: 10/01/2019 Surgeon: Surgeon(s): Elam Dutch, MD  Admission Diagnosis: Femoral-popliteal bypass graft occlusion, left [facility]) [T82.898A] Peripheral arterial disease ([place]) [I73.9]  Discharge Diagnoses:  Femoral-popliteal bypass graft occlusion, left ([place]) [T82.898A] Peripheral arterial disease ([place]) [I73.9]  Secondary Diagnoses: Past Medical History:  Diagnosis Date  . Abnormal LFTs    a. in the past when on Lipitor  . Anemia    as a young woman  . Anxiety   . Arnold-Chiari malformation, type I ([place])    MRI brain 02/2010  . CAD (coronary artery disease)    a. NSTEMI 07/2009: LHC - D2 40%, LAD irreg., EF 50% with apical AK (tako-tsubo CM);  b. Inf STEMI (04/2013): Tx Promus DES to CFX;  c. 08/2013 Lexi CL: No ischemia, dist ant/ap/inf-ap infarct, EF  d. 06/2016 NSTEMI with LM disease: s/p CABG on 07/17/2016 w/ LIMA-LAD, SVG-OM, and SVG-dRCA.  [name] DEPRESSION   . Dizziness    ? CVA 01/2010 - carotid dopplers with no ICA stenosis  . GERD (gastroesophageal reflux disease)   . Headache   . HYPERLIPIDEMIA   . HYPERTENSION   . MI (myocardial infarction) ([place])   . PAD (peripheral artery disease) ([place])    s/p L fem pop 11/2013 in setting of 1st toe osteo/gangrene  . Paroxysmal atrial fibrillation (HCC)    a. anticoagulated with Eliquis 10/2014  . 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 RA (rheumatoid arthritis) ([place])    prior tx by Dr. Ouida Sills  . Sjogren's syndrome ([place]) 06/02/13   pt denies this (12/01/13)  . SLE (systemic lupus erythematosus) ([place]) dx 05/2013   follows with rheum Ouida Sills)  . Tachy-brady syndrome (HCC)    a. s/p STJ Leadless pacemaker 11-04-2014 Dr Rayann Heman  . 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 Wears dentures   . Wears glasses     Procedure(s): LEFT AMPUTATION ABOVE KNEE  Discharged Condition: good  HPI:  Mercedes [name] is a 64 y.o. female presented to the office on 1/26 for an urgent visit for severe left lower extremity pain. Has a long history of peripheral vascular occlusive disease.  Had undergone femoral to below-knee popliteal bypass with Dr. Oneida Alar in 2015.  She presented in September 2020 with occlusion and critical ischemia to her foot.  She underwent thrombectomy of her bypass and cadaver vein patch of the proximal anastomosis.  Underwent formal arteriogram 3 days following this showing single-vessel runoff via peroneal with occlusion at the ankle.  She separately underwent transmetatarsal amputation with Dr. Sharol Given.  This has healed but she has had continued severe pain which has been progressive in her foot. Discussion was made in the office regarding reexploration and attempted thrombectomy of her bypass graft which was planned for 09/25/19. Risks of limb loss were discussed with her at this visit   Hospital Course:  Mercedes [name] is a 64 y.o. female is S/P attempted thrombectomy of left femoral to below knee popliteal vein bypass. New left femoral to below knee popliteal Gore-Tex graft. She continued to have significant left leg pain following new bypass as well as some ischemic limb changes in her left foot which unfortunately did not improve. She had no further options for revascularization or limb salvage.  It was discussed with her that her best option was an above knee amputation for best healing results. She subsequently underwent a Left above knee amputation on 10/01/19. She has done well following left above knee amputation. Improved left lower extremity pain. She will be discharged home with assistance of her daughter as well as arrangements for home health PT/OT with [facility]. She has a scheduled follow up in 3-4  weeks  Procedure(s): LEFT AMPUTATION ABOVE KNEE Post-op wounds clean, dry, intact, healing well Pt. Transfers independently, voiding and taking PO diet without difficulty. Pt pain controlled with PO pain meds. Labs as below Complications:none  Consults:  Treatment Team:  Sherren Kerns, MD  Significant Diagnostic Studies: CBC Lab Results  Component Value Date   WBC 11.1 (H) 10/03/2019   HGB 10.6 (L) 10/03/2019   HCT 34.6 (L) 10/03/2019   MCV 78.3 (L) 10/03/2019   PLT 578 (H) 10/03/2019    BMET    Component Value Date/Time   NA 136 10/03/2019 0656   NA 135 10/09/2018 1125   K 4.0 10/03/2019 0656   CL 102 10/03/2019 0656   CO2 25 10/03/2019 0656   GLUCOSE 93 10/03/2019 0656   BUN 9 10/03/2019 0656   BUN 10 10/09/2018 1125   CREATININE 0.69 10/03/2019 0656   CREATININE 1.13 (H) 08/03/2016 1440   CALCIUM 9.0 10/03/2019 0656   GFRNONAA >60 10/03/2019 0656   GFRAA >60 10/03/2019 0656   COAG Lab Results  Component Value Date   INR 1.1 10/01/2019   INR 1.0 09/25/2019   INR 1.1 05/15/2019    Disposition:  Discharge to :Home with Home Health and Home health PT  Allergies as of 10/06/2019      Reactions   Brilinta [ticagrelor] Anaphylaxis, Other (See Comments)   Also, chest tightness   Latex Rash   Tape Rash      Medication List    TAKE these medications   acetaminophen 500 MG tablet Commonly known as: TYLENOL Take 500-1,000 mg by mouth every 8 (eight) hours as needed for mild pain or headache.   DULoxetine 30 MG capsule Commonly known as: CYMBALTA Take 1 capsule (30 mg total) by mouth daily. Qam What changed: additional instructions   Eliquis 5 MG Tabs tablet Generic drug: apixaban TAKE 1 TABLET BY MOUTH TWICE DAILY What changed: how much to take   lisinopril 10 MG tablet Commonly known as: ZESTRIL Take 1 tablet (10 mg total) by mouth daily.   metoprolol tartrate 25 MG tablet Commonly known as: LOPRESSOR Take 0.5 tablets (12.5 mg total) by  mouth 2 (two) times daily.   multivitamin with minerals Tabs tablet Take 1 tablet by mouth daily. What changed: when to take this   nitroGLYCERIN 0.4 MG SL tablet Commonly known as: NITROSTAT Place 1 tablet (0.4 mg total) under the tongue every 5 (five) minutes as needed for chest pain.   oxyCODONE 5 MG immediate release tablet Commonly known as: Oxy IR/ROXICODONE Take 1 tablet (5 mg total) by mouth every 6 (six) hours as needed for moderate pain.   oxyCODONE-acetaminophen 5-325 MG tablet Commonly known as: PERCOCET/ROXICET Take 1 tablet by mouth every 4 (four) hours as needed for severe pain.   rosuvastatin 40 MG tablet Commonly known as: CRESTOR Take 1 tablet (40 mg total) by mouth daily.      Verbal and written Discharge instructions given to the patient. Wound care per Discharge AVS Follow-up Information    Sherren Kerns, MD Follow up in 3 week(s).  Specialties: Vascular Surgery, Cardiology Why: office will call Contact information: [address] [place] [address] [phone]          Signed: Graceann Congress 10/06/2019, 11:23 AM

## 2019-10-06 NOTE — Plan of Care (Signed)
DISCHARGE NOTE HOME [name] to be discharged home per MD order. Discussed prescriptions and follow up appointments with the patient. Prescriptions given to patient; medication list explained in detail. Patient verbalized understanding.  Skin clean, dry and intact without evidence of skin break down, no evidence of skin tears noted. IV catheter discontinued intact. Site without signs and symptoms of complications. Dressing and pressure applied. Pt denies pain at the site currently. No complaints noted.  Patient free of lines, drains, and wounds.   An After Visit Summary (AVS) was printed and given to the patient. Patient escorted via wheelchair, and discharged home via private auto.  Arlice Colt, RN

## 2019-10-06 NOTE — Progress Notes (Signed)
  Progress Note    10/06/2019 7:26 AM 5 Days Post-Op  Subjective: States last night was first time she had a phantom pain in left leg. Otherwise denies any pain this morning in AKA stump. She is wanting to know when she can go home   Vitals:   10/06/19 0028 10/06/19 0500  BP: 119/68 110/83  Pulse: 66 64  Resp: 14 16  Temp: 98.1 F (36.7 C) 98.6 F (37 C)  SpO2: 96% 99%   Physical Exam: General: Alert and oriented, well appearing, not in any discomfort Lungs:  Non labored Incisions:  L AKA stump healing well, clean, dry. No hematoma or fluid collections. Mild tenderness along staple line Extremities:  Left groin incision healing well. No swelling or tenderness. 1+ L femoral pulse Neurologic: appropriate mood and affect  CBC    Component Value Date/Time   WBC 11.1 (H) 10/03/2019 0656   RBC 4.42 10/03/2019 0656   HGB 10.6 (L) 10/03/2019 0656   HGB 14.2 10/09/2018 1125   HCT 34.6 (L) 10/03/2019 0656   HCT 44.6 10/09/2018 1125   PLT 578 (H) 10/03/2019 0656   PLT 289 10/09/2018 1125   MCV 78.3 (L) 10/03/2019 0656   MCV 79 10/09/2018 1125   MCH 24.0 (L) 10/03/2019 0656   MCHC 30.6 10/03/2019 0656   RDW 15.7 (H) 10/03/2019 0656   RDW 14.8 10/09/2018 1125   LYMPHSABS 2.5 05/22/2019 1816   MONOABS 0.6 05/22/2019 1816   EOSABS 0.4 05/22/2019 1816   BASOSABS 0.1 05/22/2019 1816    BMET    Component Value Date/Time   NA 136 10/03/2019 0656   NA 135 10/09/2018 1125   K 4.0 10/03/2019 0656   CL 102 10/03/2019 0656   CO2 25 10/03/2019 0656   GLUCOSE 93 10/03/2019 0656   BUN 9 10/03/2019 0656   BUN 10 10/09/2018 1125   CREATININE 0.69 10/03/2019 0656   CREATININE 1.13 (H) 08/03/2016 1440   CALCIUM 9.0 10/03/2019 0656   GFRNONAA >60 10/03/2019 0656   GFRAA >60 10/03/2019 0656    INR    Component Value Date/Time   INR 1.1 10/01/2019 0328     Intake/Output Summary (Last 24 hours) at 10/06/2019 0726 Last data filed at 10/06/2019 0636 Gross per 24 hour  Intake 200  ml  Output 600 ml  Net -400 ml     Assessment/Plan:  64 y.o. female is s/p Left Above knee amputation 5 Days Post-Op. Restarted on Eliquis. Medically ready for d/c home awaiting arrangement for home health services  DVT prophylaxis:  Eliquis   Graceann Congress, PA-C [facility] [phone] 10/06/2019 7:26 AM

## 2019-10-06 NOTE — Progress Notes (Signed)
Physical Therapy Treatment Patient Details Name: Mercedes [name] MRN: [id number] DOB: 10-08-1955 Today's Date: 10/06/2019    History of Present Illness Patient is a 64 y/o female who presents s/p New left femoral to below-knee popliteal 6 mm ringed Gore-Tex graft after a failed thrombectomy 1/28. 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    PT Comments    PT called to pt room because pt is surprisingly d/c-ing today and needs to practice stair navigation. Pt's most successful stair attempt was boost/bump-up method, pt requiring min guard only for safety and pt demonstrated proficiently on lowering onto step and standing from lowest step.  PT educated pt on using gait belt during stair navigation, having someone assist and guide her on steps, and having someone hold RW steady when pt stands from lowest step on descent. Pt with no further questions, is now eager to d/c home with HHPT.   Follow Up Recommendations  Home health PT;Supervision for mobility/OOB     Equipment Recommendations  None recommended by PT    Recommendations for Other Services       Precautions / Restrictions Precautions Precautions: Fall Restrictions Weight Bearing Restrictions: Yes LLE Weight Bearing: Non weight bearing    Mobility  Bed Mobility Overal bed mobility: Modified Independent Bed Mobility: Supine to Sit     Supine to sit: Modified independent (Device/Increase time) Sit to supine: Modified independent (Device/Increase time)   General bed mobility comments: Increased time and effort, use of bedrails.  Transfers Overall transfer level: Needs assistance Equipment used: Rolling walker (2 wheeled) Transfers: Sit to/from Stand Sit to Stand: Supervision         General transfer comment: supervision for safety, verbal cuing for hand placement when rising. sit to stand x2, from bed and from recliner in preparation for stair training.  Ambulation/Gait Ambulation/Gait assistance: Min guard Gait Distance (Feet):  15 Feet Assistive device: Rolling walker (2 wheeled) Gait Pattern/deviations: Step-to pattern Gait velocity: decr   General Gait Details: min guard for safety, pt with appropriate hop-to gait. Verbal cuing for upright posture, placement in RW, and keeping LLE in neutral hip extension.   Stairs Stairs: Yes Stairs assistance: Min guard;Min assist;+2 safety/equipment Stair Management: No rails;Seated/boosting;Forwards;Backwards;With walker Number of Stairs: 3 General stair comments: First attempt of stairs was backwards with RW; pt unable to progress to second step due to feeling unsteady. Second attempt with axillary crutches, also unsuccessful due to pt RLE weakness and unsteadiness in hop phase. Third attempt with bump-up and bump-down method, which was safe and required min guard only to boost on steps, pt able to lower onto step in preparation for ascending and stand from lowest step after descending. PT educated pt on using gait belt, having someone assist and guide her on steps, hold RW when pt stands from lowest step.   Wheelchair Mobility    Modified Rankin (Stroke Patients Only)       Balance Overall balance assessment: Needs assistance Sitting-balance support: Feet supported;No upper extremity supported Sitting balance-Leahy Scale: Good Sitting balance - Comments: supervision   Standing balance support: During functional activity Standing balance-Leahy Scale: Poor Standing balance comment: reliant on external support                            Cognition Arousal/Alertness: Awake/alert Behavior During Therapy: WFL for tasks assessed/performed Overall Cognitive Status: Within Functional Limits for tasks assessed  Exercises         Pertinent Vitals/Pain Pain Assessment: 0-10 Pain Score: 3  Pain Location: Lft residual limb, now having phantom pain Pain Descriptors / Indicators:  Guarding;Grimacing;Sore;Aching Pain Intervention(s): Limited activity within patient's tolerance;Monitored during session;Repositioned;Relaxation    Home Living                      Prior Function            PT Goals (current goals can now be found in the care plan section) Acute Rehab PT Goals Patient Stated Goal: to go home PT Goal Formulation: With patient Time For Goal Achievement: 10/16/19 Potential to Achieve Goals: Good Progress towards PT goals: Progressing toward goals    Frequency    Min 3X/week      PT Plan Discharge plan needs to be updated    Co-evaluation              AM-PAC PT "6 Clicks" Mobility   Outcome Measure  Help needed turning from your back to your side while in a flat bed without using bedrails?: None Help needed moving from lying on your back to sitting on the side of a flat bed without using bedrails?: None Help needed moving to and from a bed to a chair (including a wheelchair)?: A Little Help needed standing up from a chair using your arms (e.g., wheelchair or bedside chair)?: A Little Help needed to walk in hospital room?: A Little Help needed climbing 3-5 steps with a railing? : A Little 6 Click Score: 20    End of Session Equipment Utilized During Treatment: Gait belt Activity Tolerance: Patient tolerated treatment well Patient left: in bed;with call bell/phone within reach;with bed alarm set Nurse Communication: Mobility status PT Visit Diagnosis: Difficulty in walking, not elsewhere classified (R26.2);Pain Pain - Right/Left: Left Pain - part of body: Leg     Time: 1620-1650 PT Time Calculation (min) (ACUTE ONLY): 30 min  Charges:  $Gait Training: 23-37 mins                     Michaelah Credeur E, PT Acute Rehabilitation Services Pager [phone]  Office [phone]    Saad Buhl D Elonda Husky 10/06/2019, 5:24 PM

## 2019-10-06 NOTE — Progress Notes (Signed)
Occupational Therapy Treatment Patient Details Name: Mercedes [name] MRN: [id number] DOB: 1956/04/05 Today's Date: 10/06/2019    History of present illness Patient is a 64 y/o female who presents s/p New left femoral to below-knee popliteal 6 mm ringed Gore-Tex graft after a failed thrombectomy 1/28. 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   OT comments  Patient continues to progress towards goals in skilled OT session. Session consisted of bed level grooming, continued education in regards to bathing safely and energy conservation. Pt's session limited by lowered BP, 101/75 initially from supine to sitting, then 93/65 upon standing. Pt reports "lightheaded" feeling throughout session. Due to pressures, pt completed grooming tasks of washing face and brushing teeth with set up. Pt able to verbalize safe strategies for grooming at the sink once home, and therapist provided education in regards to showering while seated in order to safe energy and ensure increased balance. Therapist reassessed BP, and increased to 114/73 at the end of the session. Pt left sitting at EOB with needs met, will continue to follow acutely.    Follow Up Recommendations  Home health OT    Equipment Recommendations  None recommended by OT    Recommendations for Other Services      Precautions / Restrictions Precautions Precautions: Fall Precaution Comments: watch BP Restrictions Weight Bearing Restrictions: No LLE Weight Bearing: Non weight bearing       Mobility Bed Mobility Overal bed mobility: Modified Independent Bed Mobility: Supine to Sit     Supine to sit: Modified independent (Device/Increase time) Sit to supine: Modified independent (Device/Increase time)   General bed mobility comments: Required min extra time to transfer from supine to sit,  Transfers Overall transfer level: Needs assistance Equipment used: Rolling walker (2 wheeled) Transfers: Sit to/from Stand Sit to Stand: Min guard          General transfer comment: Min guard, from sit to stand, however became increasingly dizzy, BP assessed, (93/65), therefore completed grooming in sitting    Balance Overall balance assessment: Needs assistance Sitting-balance support: Feet supported;No upper extremity supported Sitting balance-Leahy Scale: Good Sitting balance - Comments: supervision                                   ADL either performed or assessed with clinical judgement   ADL Overall ADL's : Needs assistance/impaired     Grooming: Wash/dry face;Wash/dry hands;Oral care;Set up;Sitting Grooming Details (indicate cue type and reason): Grooming completed in sitting after BP dropped to 93/65 in standing and pt reporting dizziness in sitting and standing                                     Vision       Perception     Praxis      Cognition Arousal/Alertness: Awake/alert Behavior During Therapy: WFL for tasks assessed/performed Overall Cognitive Status: Within Functional Limits for tasks assessed                                 General Comments: Pt feeling 'lightheaded" throughout session, however did not impede tasks assessed.        Exercises     Shoulder Instructions       General Comments      Pertinent Vitals/ Pain  Pain Assessment: 0-10 Pain Score: 3  Pain Location: Lft residual limb, now having phantom pain Pain Descriptors / Indicators: Guarding;Grimacing;Sore;Aching Pain Intervention(s): Monitored during session;Repositioned  Home Living                                          Prior Functioning/Environment              Frequency  Min 2X/week        Progress Toward Goals  OT Goals(current goals can now be found in the care plan section)  Progress towards OT goals: Progressing toward goals  Acute Rehab OT Goals Patient Stated Goal: to go home OT Goal Formulation: With patient Time For Goal Achievement:  10/10/19 Potential to Achieve Goals: Good  Plan Discharge plan remains appropriate    Co-evaluation                 AM-PAC OT "6 Clicks" Daily Activity     Outcome Measure   Help from another person eating meals?: None Help from another person taking care of personal grooming?: A Little Help from another person toileting, which includes using toliet, bedpan, or urinal?: A Little Help from another person bathing (including washing, rinsing, drying)?: A Little Help from another person to put on and taking off regular upper body clothing?: A Little Help from another person to put on and taking off regular lower body clothing?: A Little 6 Click Score: 19    End of Session Equipment Utilized During Treatment: Gait belt;Rolling walker  OT Visit Diagnosis: Other abnormalities of gait and mobility (R26.89);Pain Pain - Right/Left: Left Pain - part of body: Leg   Activity Tolerance Patient tolerated treatment well;Other (comment)(Therapy limited to BP levels, will continue to monitor to progress)   Patient Left in bed;with call bell/phone within reach   Nurse Communication          Time: [phone] OT Time Calculation (min): 23 min  Charges: OT General Charges $OT Visit: 1 Visit OT Treatments $Self Care/Home Management : 23-37 mins  Roby. Aniayah Alaniz Cosmos 10/06/2019, 10:22 AM

## 2019-10-07 ENCOUNTER — Telehealth: Payer: Self-pay | Admitting: *Deleted

## 2019-10-07 NOTE — Addendum Note (Signed)
Addendum  created 10/07/19 0911 by Jodell Cipro, CRNA   Attestation recorded in [place], Intraprocedure Attestations filed

## 2019-10-07 NOTE — Telephone Encounter (Signed)
Pt is on TCM report admitted 09/25/19 for Femoral-popliteal bypass graft occlusion, left and Peripheral arterial disease. Pt underwent thrombectomy of her bypass and cadaver vein patch of the proximal anastomosis. Underwent formal arteriogram 3 days following this showing single-vessel runoff via peroneal with occlusion at the ankle. Pt underwent a Left above knee amputation on 10/01/19. She has done well following left above knee amputation. Improved left lower extremity pain. Pt discharged 10/06/19 home with assistance of her daughter as well as arrangements for home health PT/OT with [facility]. Will follow-up w/Dr. Lajoyce Corners in 2-32 wks.Marland KitchenRaechel Chute

## 2019-10-08 DIAGNOSIS — I10 Essential (primary) hypertension: Secondary | ICD-10-CM | POA: Diagnosis not present

## 2019-10-08 DIAGNOSIS — T82898D Other specified complication of vascular prosthetic devices, implants and grafts, subsequent encounter: Secondary | ICD-10-CM | POA: Diagnosis not present

## 2019-10-08 DIAGNOSIS — I2510 Atherosclerotic heart disease of native coronary artery without angina pectoris: Secondary | ICD-10-CM | POA: Diagnosis not present

## 2019-10-08 DIAGNOSIS — I252 Old myocardial infarction: Secondary | ICD-10-CM | POA: Diagnosis not present

## 2019-10-08 DIAGNOSIS — I70222 Atherosclerosis of native arteries of extremities with rest pain, left leg: Secondary | ICD-10-CM | POA: Diagnosis not present

## 2019-10-09 DIAGNOSIS — I10 Essential (primary) hypertension: Secondary | ICD-10-CM | POA: Diagnosis not present

## 2019-10-09 DIAGNOSIS — I70222 Atherosclerosis of native arteries of extremities with rest pain, left leg: Secondary | ICD-10-CM | POA: Diagnosis not present

## 2019-10-09 DIAGNOSIS — I2510 Atherosclerotic heart disease of native coronary artery without angina pectoris: Secondary | ICD-10-CM | POA: Diagnosis not present

## 2019-10-09 DIAGNOSIS — I252 Old myocardial infarction: Secondary | ICD-10-CM | POA: Diagnosis not present

## 2019-10-09 DIAGNOSIS — T82898D Other specified complication of vascular prosthetic devices, implants and grafts, subsequent encounter: Secondary | ICD-10-CM | POA: Diagnosis not present

## 2019-10-13 DIAGNOSIS — T82898D Other specified complication of vascular prosthetic devices, implants and grafts, subsequent encounter: Secondary | ICD-10-CM | POA: Diagnosis not present

## 2019-10-13 DIAGNOSIS — I70222 Atherosclerosis of native arteries of extremities with rest pain, left leg: Secondary | ICD-10-CM | POA: Diagnosis not present

## 2019-10-13 DIAGNOSIS — I252 Old myocardial infarction: Secondary | ICD-10-CM | POA: Diagnosis not present

## 2019-10-13 DIAGNOSIS — I10 Essential (primary) hypertension: Secondary | ICD-10-CM | POA: Diagnosis not present

## 2019-10-13 DIAGNOSIS — I2510 Atherosclerotic heart disease of native coronary artery without angina pectoris: Secondary | ICD-10-CM | POA: Diagnosis not present

## 2019-10-14 DIAGNOSIS — I252 Old myocardial infarction: Secondary | ICD-10-CM | POA: Diagnosis not present

## 2019-10-14 DIAGNOSIS — T82898D Other specified complication of vascular prosthetic devices, implants and grafts, subsequent encounter: Secondary | ICD-10-CM | POA: Diagnosis not present

## 2019-10-14 DIAGNOSIS — I2510 Atherosclerotic heart disease of native coronary artery without angina pectoris: Secondary | ICD-10-CM | POA: Diagnosis not present

## 2019-10-14 DIAGNOSIS — I10 Essential (primary) hypertension: Secondary | ICD-10-CM | POA: Diagnosis not present

## 2019-10-14 DIAGNOSIS — I70222 Atherosclerosis of native arteries of extremities with rest pain, left leg: Secondary | ICD-10-CM | POA: Diagnosis not present

## 2019-10-16 DIAGNOSIS — M79662 Pain in left lower leg: Secondary | ICD-10-CM | POA: Diagnosis not present

## 2019-10-17 DIAGNOSIS — I2510 Atherosclerotic heart disease of native coronary artery without angina pectoris: Secondary | ICD-10-CM | POA: Diagnosis not present

## 2019-10-17 DIAGNOSIS — I70222 Atherosclerosis of native arteries of extremities with rest pain, left leg: Secondary | ICD-10-CM | POA: Diagnosis not present

## 2019-10-17 DIAGNOSIS — I252 Old myocardial infarction: Secondary | ICD-10-CM | POA: Diagnosis not present

## 2019-10-17 DIAGNOSIS — I10 Essential (primary) hypertension: Secondary | ICD-10-CM | POA: Diagnosis not present

## 2019-10-17 DIAGNOSIS — T82898D Other specified complication of vascular prosthetic devices, implants and grafts, subsequent encounter: Secondary | ICD-10-CM | POA: Diagnosis not present

## 2019-10-20 DIAGNOSIS — Z89612 Acquired absence of left leg above knee: Secondary | ICD-10-CM | POA: Insufficient documentation

## 2019-10-20 NOTE — Progress Notes (Signed)
Subjective:    Patient ID: Mercedes [name], female    DOB: April 11, 1956, 64 y.o.   MRN: [id number]  HPI The patient is here for follow up of their chronic medical problems.  Her daughter is living with her at this time,but lives in [place] and needs to go back to [place] at some point.  She needs assistance at home due to her recent Left AKA.    Last Wednesday she fell and landed partially on her left stump and it has had increased pain since then.  The medial aspect is slightly pink and is tender.    PAD:  She was admitted 1/28-2/8 for PAD.  She had a thrombectomy of her bypass and cadaver vein patch of the proximal anastomosis, she had single vessel runoff via peroneal with occlusion at the ankle.  She had an AKA on the left on 2/3.  She is at home with assistance from her daughter.  She had home PT/OT with [facility].  She has PT coming and she does find that helpful.  CAD, Hypertension: She is taking her medication daily. She is compliant with a low sodium diet.  Her daughter has been cooking for her.     Hyperlipidemia: She is not taking her medication daily. She is compliant with a low fat/cholesterol diet. She denies myalgias.   Diabetes: She is controlling her sugars with lifestyle. She is compliant with a diabetic diet.  She has cut down on the cokes.     She is not sleeping.  Her leg bothers her.  Her sleep is intermittent.  She does nap during the day.  She does not eat much.   She is not able to drive due to her AKA at this time.    Medications and allergies reviewed with patient and updated if appropriate.  Patient Active Problem List   Diagnosis Date Noted  . S/P AKA (above knee amputation), left ([place]), 10/01/19 10/20/2019  . Femoral-popliteal bypass graft occlusion, left ([place]) 09/25/2019  . Peripheral arterial disease ([place]) 09/25/2019  . History of transmetatarsal amputation of left foot ([place]) 07/17/2019  . Protein-calorie malnutrition, severe 05/15/2019  . Critical  lower limb ischemia 05/09/2019  . Dyspnea 02/01/2019  . Weight loss, unintentional 02/01/2019  . Epigastric pain 02/01/2019  . 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 Acute renal failure superimposed on stage 2 chronic kidney disease ([place]) 10/20/2018  . Diabetes ([place]) 04/01/2017  . Hyperthyroidism 02/21/2017  . Low mean corpuscular volume (MCV) 09/30/2016  . Coronary artery disease involving native heart without angina pectoris   . Hx of CABG 07/17/2016  . Paroxysmal atrial fibrillation ([place]) 07/12/2016  . NSTEMI (non-ST elevated myocardial infarction) ([place]) 07/11/2016  . History of arterial bypass of lower extremity 12/30/2015  . Claudication of right lower extremity ([place]) 12/30/2015  . Phantom limb pain-Left great toe 04/29/2015  . Seizure ([place])   . Atrial flutter with rapid ventricular response vs. SVT 10/27/2014  . Atherosclerosis of native artery of left lower extremity with rest pain ([place]) 12/18/2013  . PAD (peripheral artery disease) ([place]) 12/02/2013  . SLE (systemic lupus erythematosus) ([place])   . CAD - S/P Takotsubo MI in 2000, CFX DES Sept 2014 05/25/2013  . Acute MI, inferior wall ([place]) 05/19/2013  . 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 Tachycardia-bradycardia ([place]) 03/09/2010  . PVD- s/p Lt [facility] April 2015 10/29/2009  . Dyslipidemia 08/26/2009  . Essential hypertension 08/26/2009  . CORONARY ATHEROSCLEROSIS NATIVE CORONARY ARTERY 08/26/2009  . 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  Rheumatoid arthritis (HCC) 08/26/2009    Current Outpatient Medications on File Prior to Visit  Medication Sig Dispense Refill  . acetaminophen (TYLENOL) 500 MG tablet Take 500-1,000 mg by mouth every 8 (eight) hours as needed for mild pain or headache.    . DULoxetine (CYMBALTA) 30 MG capsule Take 1 capsule (30 mg total) by mouth daily. Qam (Patient taking differently: Take 30 mg by mouth daily. ) 30 capsule 0  . ELIQUIS 5 MG TABS tablet TAKE 1 TABLET BY MOUTH TWICE DAILY (Patient taking  differently: Take 5 mg by mouth 2 (two) times daily. ) 180 tablet 3  . lisinopril (ZESTRIL) 10 MG tablet Take 1 tablet (10 mg total) by mouth daily. 90 tablet 2  . metoprolol tartrate (LOPRESSOR) 25 MG tablet Take 0.5 tablets (12.5 mg total) by mouth 2 (two) times daily. 180 tablet 3  . Multiple Vitamin (MULTIVITAMIN WITH MINERALS) TABS tablet Take 1 tablet by mouth daily. (Patient taking differently: Take 1 tablet by mouth 3 (three) times a week. )    . nitroGLYCERIN (NITROSTAT) 0.4 MG SL tablet Place 1 tablet (0.4 mg total) under the tongue every 5 (five) minutes as needed for chest pain. 25 tablet 3  . oxyCODONE-acetaminophen (PERCOCET/ROXICET) 5-325 MG tablet Take 1 tablet by mouth every 4 (four) hours as needed for severe pain. 30 tablet 0  . rosuvastatin (CRESTOR) 40 MG tablet Take 1 tablet (40 mg total) by mouth daily. 90 tablet 3   No current facility-administered medications on file prior to visit.    Past Medical History:  Diagnosis Date  . Abnormal LFTs    a. in the past when on Lipitor  . Anemia    as a young woman  . Anxiety   . Arnold-Chiari malformation, type I (HCC)    MRI brain 02/2010  . CAD (coronary artery disease)    a. NSTEMI 07/2009: LHC - D2 40%, LAD irreg., EF 50% with apical AK (tako-tsubo CM);  b. Inf STEMI (04/2013): Tx Promus DES to CFX;  c. 08/2013 [facility] CL: No ischemia, dist ant/ap/inf-ap infarct, EF  d. 06/2016 NSTEMI with LM disease: s/p CABG on 07/17/2016 w/ LIMA-LAD, SVG-OM, and SVG-dRCA.  [name] DEPRESSION   . Dizziness    ? CVA 01/2010 - carotid dopplers with no ICA stenosis  . GERD (gastroesophageal reflux disease)   . Headache   . HYPERLIPIDEMIA   . HYPERTENSION   . MI (myocardial infarction) (HCC)   . PAD (peripheral artery disease) (HCC)    s/p L fem pop 11/2013 in setting of 1st toe osteo/gangrene  . Paroxysmal atrial fibrillation (HCC)    a. anticoagulated with Eliquis 10/2014  . Presence of permanent cardiac pacemaker 10/2014   leadless permanent  pacemaker  . RA (rheumatoid arthritis) (HCC)    prior tx by Dr. Dareen Piano  . Sjogren's syndrome (HCC) 06/02/13   pt denies this (12/01/13)  . SLE (systemic lupus erythematosus) (HCC) dx 05/2013   follows with rheum Dareen Piano)  . Tachy-brady syndrome (HCC)    a. s/p STJ Leadless pacemaker 11-04-2014 Dr Johney Frame  . 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 Wears dentures   . Wears glasses     Past Surgical History:  Procedure Laterality Date  . ABDOMINAL AORTAGRAM N/A 11/24/2013   Procedure: ABDOMINAL AORTAGRAM WITH BILATERAL RUNOFF WITH POSSIBLE INTERVENTION;  Surgeon: Chuck Hint, MD;  [facility] CATH LAB;  Service: Cardiovascular;  Laterality: N/A;  .  ABDOMINAL AORTOGRAM W/LOWER EXTREMITY N/A 04/25/2019   Procedure: ABDOMINAL AORTOGRAM W/LOWER EXTREMITY;  Surgeon: Sherren Kerns, MD;  Location: [facility] INVASIVE CV LAB;  Service: Cardiovascular;  Laterality: N/A;  . ABDOMINAL AORTOGRAM W/LOWER EXTREMITY Bilateral 05/15/2019   Procedure: ABDOMINAL AORTOGRAM W/LOWER EXTREMITY;  Surgeon: Nada Libman, MD;  Location: [facility] INVASIVE CV LAB;  Service: Cardiovascular;  Laterality: Bilateral;  . AMPUTATION Left 12/02/2013   Procedure: LEFT 1ST TOE AMPUTATION;  Surgeon: Sherren Kerns, MD;  Location: [facility] OR;  Service: Vascular;  Laterality: Left;  . AMPUTATION Left 07/16/2019   Procedure: LEFT TRANSMETATARSAL AMPUTATION;  Surgeon: Nadara Mustard, MD;  Location: [facility] OR;  Service: Orthopedics;  Laterality: Left;  . AMPUTATION Left 10/01/2019   Procedure: LEFT AMPUTATION ABOVE KNEE;  Surgeon: Sherren Kerns, MD;  Location: [facility] OR;  Service: Vascular;  Laterality: Left;  . CARDIAC CATHETERIZATION N/A 07/12/2016   Procedure: Left Heart Cath and Coronary Angiography;  Surgeon: Lennette Bihari, MD;  Location: [facility] INVASIVE CV LAB;  Service: Cardiovascular;  Laterality: N/A;  . CARDIAC CATHETERIZATION N/A 07/14/2016   Procedure:  Intravascular Ultrasound/IVUS;  Surgeon: Yvonne Kendall, MD;  Location: [facility] INVASIVE CV LAB;  Service: Cardiovascular;  Laterality: N/A;  . COLONOSCOPY    . CORONARY ANGIOPLASTY  04/2013  . CORONARY ARTERY BYPASS GRAFT N/A 07/17/2016   Procedure: CORONARY ARTERY BYPASS GRAFTING (CABG)x3 using right greater saphenous vein harvested endoscopiclly and left internal mammary artery;  Surgeon: Kerin Perna, MD;  Location: [facility] OR;  Service: Open Heart Surgery;  Laterality: N/A;  . ENDARTERECTOMY FEMORAL Left 05/12/2019   Procedure: LEFT Femoral Endarterectomy;  Surgeon: Sherren Kerns, MD;  Location: [facility] OR;  Service: Vascular;  Laterality: Left;  . FEMORAL-POPLITEAL BYPASS GRAFT Left 12/02/2013   Procedure:  LEFT FEMORAL-BELOW KNEE POPLITEAL ARTERY BYPASS GRAFT;  Surgeon: Sherren Kerns, MD;  Location: [facility] OR;  Service: Vascular;  Laterality: Left;  . FEMORAL-POPLITEAL BYPASS GRAFT Left 09/25/2019   Procedure: THROMBECTOMY and PROPATEN BYPASS  FEMORAL TO BELOW KNEE POPLITEAL ARTERY BYPASS GRAFT LEFT LEG;  Surgeon: Larina Earthly, MD;  Location: [facility] OR;  Service: Vascular;  Laterality: Left;  . LEFT HEART CATHETERIZATION WITH CORONARY ANGIOGRAM N/A 05/19/2013   Procedure: LEFT HEART CATHETERIZATION WITH CORONARY ANGIOGRAM;  Surgeon: Micheline Chapman, MD;  Location: [facility] CATH LAB;  Service: Cardiovascular;  Laterality: N/A;  . LEFT HEART CATHETERIZATION WITH CORONARY ANGIOGRAM N/A 10/28/2014   Procedure: LEFT HEART CATHETERIZATION WITH CORONARY ANGIOGRAM;  Surgeon: Iran Ouch, MD;  Location: [facility] CATH LAB;  Service: Cardiovascular;  Laterality: N/A;  . LOWER EXTREMITY ANGIOGRAM Left 05/12/2019   Procedure: Lower Extremity Angiogram;  Surgeon: Sherren Kerns, MD;  Location: [facility] OR;  Service: Vascular;  Laterality: Left;  . PATCH ANGIOPLASTY Left 05/12/2019   Procedure: Left Femoral Patch Angioplasty USING CADAVERIC SAPHENOUS VEIN;  Surgeon: Sherren Kerns, MD;  Location: [facility] OR;  Service: Vascular;   Laterality: Left;  . PERCUTANEOUS CORONARY STENT INTERVENTION (PCI-S)  05/19/2013   Procedure: PERCUTANEOUS CORONARY STENT INTERVENTION (PCI-S);  Surgeon: Micheline Chapman, MD;  Location: [facility] CATH LAB;  Service: Cardiovascular;;  . PERMANENT PACEMAKER INSERTION N/A 11/04/2014   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  . TEE WITHOUT CARDIOVERSION N/A 07/17/2016   Procedure: TRANSESOPHAGEAL ECHOCARDIOGRAM (TEE);  Surgeon: Kerin Perna, MD;  Location: [facility] OR;  Service: Open Heart Surgery;  Laterality: N/A;  . THROMBECTOMY FEMORAL ARTERY Left 05/12/2019   Procedure: Left Ilio-Femoral Artery and Femoral-popliteal Graft  Thrombectomy;  Surgeon: Sherren Kerns, MD;  Location: [facility] OR;  Service: Vascular;  Laterality: Left;  . TUBAL LIGATION    . VISCERAL ANGIOGRAPHY N/A 04/25/2019   Procedure: MESENTRIC  ANGIOGRAPHY;  Surgeon: Sherren Kerns, MD;  Location: [name] INVASIVE CV LAB;  Service: Cardiovascular;  Laterality: N/A;    Social History   Socioeconomic History  . Marital status: Single    Spouse name: Not on file  . Number of children: 3  . Years of education: Not on file  . Highest education level: Not on file  Occupational History  . Occupation: retired  Tobacco Use  . Smoking status: Former Smoker    Types: Cigarettes    Quit date: 05/19/2016    Years since quitting: 3.4  . Smokeless tobacco: Never Used  . Tobacco comment:    Substance and Sexual Activity  . Alcohol use: No    Alcohol/week: 0.0 standard drinks  . Drug use: Not Currently    Types: Marijuana  . Sexual activity: Not on file  Other Topics Concern  . Not on file  Social History Narrative   Lives alone.   Social Determinants of Health   Financial Resource Strain:   . Difficulty of Paying Living Expenses: Not on file  Food Insecurity:   . Worried About Programme researcher, broadcasting/film/video in the Last Year: Not on file  . Ran Out of Food in the Last Year: Not on file  Transportation Needs:   . Lack of  Transportation (Medical): Not on file  . Lack of Transportation (Non-Medical): Not on file  Physical Activity:   . Days of Exercise per Week: Not on file  . Minutes of Exercise per Session: Not on file  Stress:   . Feeling of Stress : Not on file  Social Connections:   . Frequency of Communication with Friends and Family: Not on file  . Frequency of Social Gatherings with Friends and Family: Not on file  . Attends Religious Services: Not on file  . Active Member of Clubs or Organizations: Not on file  . Attends Banker Meetings: Not on file  . Marital Status: Not on file    Family History  Problem Relation Age of Onset  . Arthritis Mother   . Emphysema Mother   . Arthritis Father   . COPD Father   . Heart disease Father   . Diabetes Paternal Aunt        paternal Aunts  . Heart disease Paternal Aunt   . Thyroid disease Neg Hx     Review of Systems  Constitutional: Negative for chills and fever.  Respiratory: Negative for cough, shortness of breath and wheezing.   Cardiovascular: Negative for chest pain and palpitations.  Gastrointestinal: Positive for constipation. Negative for abdominal pain, blood in stool and nausea.  Neurological: Negative for light-headedness and headaches.  Psychiatric/Behavioral: Negative for dysphoric mood. The patient is nervous/anxious (sometimes).        Objective:   Vitals:   10/21/19 1302  BP: (!) 154/80  Pulse: 66  Resp: 16  Temp: 99 F (37.2 C)  SpO2: 99%   BP Readings from Last 3 Encounters:  10/21/19 (!) 154/80  10/06/19 131/73  09/23/19 (!) 151/88   Wt Readings from Last 3 Encounters:  09/29/19 110 lb (49.9 kg)  09/23/19 110 lb (49.9 kg)  09/22/19 110 lb 9.6 oz (50.2 kg)   Body mass index is 17.75 kg/m.   Physical Exam    Constitutional: Appears  well-developed and well-nourished. No distress.  HENT:  Head: Normocephalic and atraumatic.  Neck: Neck supple. No tracheal deviation present. No thyromegaly  present.  No cervical lymphadenopathy Cardiovascular: Normal rate, regular rhythm and normal heart sounds.   No murmur heard. No carotid bruit .  No edema Pulmonary/Chest: Effort normal and breath sounds normal. No respiratory distress. No has no wheezes. No rales.  Skin: Skin is warm and dry. Not diaphoretic. Left AKA stump with staples in place and scabbing along incision which is healing well.  No bleeding or discharge.  Mild erythema medial aspect of incision with tenderness with palpation.  No fluctuance, no increased warmth Psychiatric: Normal mood and affect. Behavior is normal.      Assessment & Plan:    See Problem List for Assessment and Plan of chronic medical problems.    This visit occurred during the SARS-CoV-2 public health emergency.  Safety protocols were in place, including screening questions prior to the visit, additional usage of staff PPE, and extensive cleaning of exam room while observing appropriate contact time as indicated for disinfecting solutions.

## 2019-10-21 ENCOUNTER — Encounter: Payer: Self-pay | Admitting: Internal Medicine

## 2019-10-21 ENCOUNTER — Other Ambulatory Visit: Payer: Self-pay

## 2019-10-21 ENCOUNTER — Ambulatory Visit (INDEPENDENT_AMBULATORY_CARE_PROVIDER_SITE_OTHER): Payer: Medicare Other | Admitting: Internal Medicine

## 2019-10-21 VITALS — BP 154/80 | HR 66 | Temp 99.0°F | Resp 16 | Ht 66.0 in

## 2019-10-21 DIAGNOSIS — Z89612 Acquired absence of left leg above knee: Secondary | ICD-10-CM

## 2019-10-21 DIAGNOSIS — T8789 Other complications of amputation stump: Secondary | ICD-10-CM | POA: Insufficient documentation

## 2019-10-21 DIAGNOSIS — E119 Type 2 diabetes mellitus without complications: Secondary | ICD-10-CM

## 2019-10-21 DIAGNOSIS — I70222 Atherosclerosis of native arteries of extremities with rest pain, left leg: Secondary | ICD-10-CM | POA: Diagnosis not present

## 2019-10-21 DIAGNOSIS — I10 Essential (primary) hypertension: Secondary | ICD-10-CM | POA: Diagnosis not present

## 2019-10-21 DIAGNOSIS — I252 Old myocardial infarction: Secondary | ICD-10-CM | POA: Diagnosis not present

## 2019-10-21 DIAGNOSIS — E785 Hyperlipidemia, unspecified: Secondary | ICD-10-CM

## 2019-10-21 DIAGNOSIS — T82898D Other specified complication of vascular prosthetic devices, implants and grafts, subsequent encounter: Secondary | ICD-10-CM | POA: Diagnosis not present

## 2019-10-21 DIAGNOSIS — I2510 Atherosclerotic heart disease of native coronary artery without angina pectoris: Secondary | ICD-10-CM | POA: Diagnosis not present

## 2019-10-21 DIAGNOSIS — M79609 Pain in unspecified limb: Secondary | ICD-10-CM

## 2019-10-21 NOTE — Assessment & Plan Note (Addendum)
New since her last visit She is still trying to adjust to her AKA.  She is currently wheelchair-bound and is in the process of healing She is not able to drive and her daughter has been staying with her to care for her She will need personal assistant and she did bring a form with her from the Department of Public Health, which we will complete She is not able to do all of her ADLs, but is able to do most of them Pain management per surgery-she is experiencing phantom pain Continue physical therapy

## 2019-10-21 NOTE — Patient Instructions (Signed)
Your a1c was checked today.  We will fill out the paperwork for assistance at home.   Take all your medications daily as prescribed.

## 2019-10-21 NOTE — Assessment & Plan Note (Signed)
Chronic Has not been taking her crestor -- feels her cholesterol numbers are ok  Discussed that the crestor does a lot more than just lower cholesterol and with her CAD, PAD history it is important to take the medication to stabilize her arterial plaque and prevent further plaque buildup.  The patient understands and will start taking the medication regularly

## 2019-10-21 NOTE — Assessment & Plan Note (Signed)
Acute Last Wednesday she fell and landed partially on the medial aspect of her left stump Has slightly increased erythema and tenderness on the medial aspect only 1 day she states that.  Bleeding very transiently and has not bled since then.  No pus. There has been no progression of the erythema or pain.  She has not been looking at her stump daily and is unsure exactly how much is changed, but it has not been significant  No warmth or fluctuance, no discharge Looks like the slight erythema and increased tenderness is related to the impact.  She also sits with pressure on that one area, which is likely contributing as well.  I do not think there is an active infection and I will hold off on antibiotics Advised her to monitor closely-she needs to be looking at this on a daily basis-at least once a day Advised her to avoid constant pressure on the one area of the stump-adjust sitting position, use a pillow

## 2019-10-21 NOTE — Assessment & Plan Note (Addendum)
Chronic Diet controlled Has been compliant with a diabetic diet We will check A1c Discussed the importance of good nutrition-low sugar/carbohydrate diet for healing

## 2019-10-21 NOTE — Assessment & Plan Note (Signed)
BP Readings from Last 3 Encounters:  10/21/19 (!) 154/80  10/06/19 131/73  09/23/19 (!) 151/88   BP variable, ? Related to pain Should check BP at home Continue current medications at current doses

## 2019-10-22 LAB — POCT GLYCOSYLATED HEMOGLOBIN (HGB A1C): Hemoglobin A1C: 6.1 % — AB (ref 4.0–5.6)

## 2019-10-22 NOTE — Addendum Note (Signed)
Addended by: Mercer Pod E on: 10/22/2019 01:29 PM   Modules accepted: Orders

## 2019-10-23 ENCOUNTER — Telehealth: Payer: Self-pay

## 2019-10-23 DIAGNOSIS — I2510 Atherosclerotic heart disease of native coronary artery without angina pectoris: Secondary | ICD-10-CM | POA: Diagnosis not present

## 2019-10-23 DIAGNOSIS — I252 Old myocardial infarction: Secondary | ICD-10-CM | POA: Diagnosis not present

## 2019-10-23 DIAGNOSIS — I10 Essential (primary) hypertension: Secondary | ICD-10-CM | POA: Diagnosis not present

## 2019-10-23 DIAGNOSIS — T82898D Other specified complication of vascular prosthetic devices, implants and grafts, subsequent encounter: Secondary | ICD-10-CM | POA: Diagnosis not present

## 2019-10-23 DIAGNOSIS — I70222 Atherosclerosis of native arteries of extremities with rest pain, left leg: Secondary | ICD-10-CM | POA: Diagnosis not present

## 2019-10-23 NOTE — Telephone Encounter (Signed)
Pt recently had an amputation and she states that she fell 2 days ago and her leg is now swollen, red and numb. She said that she did not land directly on the end of the stump but very concerned with how it is looking. She has been elevating her stump she said but no improvement.   Appt made for pt to be seen.   Mercedes [name] Mercedes [name]

## 2019-10-24 ENCOUNTER — Other Ambulatory Visit: Payer: Self-pay

## 2019-10-24 ENCOUNTER — Ambulatory Visit (INDEPENDENT_AMBULATORY_CARE_PROVIDER_SITE_OTHER): Payer: Self-pay | Admitting: Physician Assistant

## 2019-10-24 VITALS — BP 140/81 | HR 68 | Temp 96.3°F | Resp 14 | Ht 66.0 in | Wt 107.0 lb

## 2019-10-24 DIAGNOSIS — Z89612 Acquired absence of left leg above knee: Secondary | ICD-10-CM

## 2019-10-24 MED ORDER — OXYCODONE HCL 5 MG PO TABS: 5.0000 mg | ORAL_TABLET | Freq: Four times a day (QID) | ORAL | 0 refills | Status: DC | PRN

## 2019-10-24 MED ORDER — CEPHALEXIN 500 MG PO CAPS: 500.0000 mg | ORAL_CAPSULE | Freq: Two times a day (BID) | ORAL | 0 refills | Status: DC

## 2019-10-24 NOTE — Progress Notes (Signed)
    Postoperative Visit    History of Present Illness   Mercedes [name] is a 64 y.o. female who presents for postoperative follow-up.  She presented to the hospital with a ischemic left lower extremity and underwent thrombectomy with femoral to distal bypass by Dr. Arbie Cookey on 09/25/2019.  She then subsequently required left above-knee amputation during same hospitalization on 10/01/2019 due to extensive tissue loss.  3 days ago patient states she fell on her AKA stump and is now concerned about its appearance.  She says that has been swollen, red, and numb in the area of the trauma.   For VQI Use Only   PRE-ADM LIVING: Home  AMB STATUS: Wheelchair   Physical Examination   Vitals:   10/24/19 0821  Resp: 14  SpO2: 100%    LLE: Stump incision with induration, erythema, and necrotic skin edges medially; some murky yellowish drainage when staples were removed  Medical Decision Making   Mercedes [name] is a 64 y.o. female who presents s/p left above-the-knee amputation after traumatic fall   Medial incision indurated, erythematous after a fall  3 staples removed and packed with corner of 2x2 gauze  Prescription for keflex given  Prescription for pain medicine given  Follow up next week for recheck  Patient is aware she may require OR debridement if area worsens  Emilie Rutter PA-C [facility] Office: [phone]  Clinic MD: Randie Heinz

## 2019-10-27 ENCOUNTER — Telehealth: Payer: Self-pay

## 2019-10-27 NOTE — Telephone Encounter (Signed)
New message    The patient checking on the status of paperwork seen in the office on 2.23.2020 [facility]

## 2019-10-28 NOTE — Telephone Encounter (Signed)
Forms faxed. Pt is aware.

## 2019-10-28 NOTE — Telephone Encounter (Signed)
Patient calling to check status. States that the paperwork has been here for a week and she is needing this done asap due to being an amputee and needing assistance.

## 2019-10-29 ENCOUNTER — Telehealth (HOSPITAL_COMMUNITY): Payer: Self-pay

## 2019-10-29 NOTE — Telephone Encounter (Signed)

## 2019-10-30 ENCOUNTER — Other Ambulatory Visit: Payer: Self-pay

## 2019-10-30 ENCOUNTER — Ambulatory Visit (INDEPENDENT_AMBULATORY_CARE_PROVIDER_SITE_OTHER): Payer: Self-pay | Admitting: Physician Assistant

## 2019-10-30 VITALS — BP 106/73 | HR 87 | Temp 97.2°F | Resp 14 | Ht 66.0 in | Wt 106.0 lb

## 2019-10-30 DIAGNOSIS — T8189XA Other complications of procedures, not elsewhere classified, initial encounter: Secondary | ICD-10-CM | POA: Insufficient documentation

## 2019-10-30 DIAGNOSIS — I739 Peripheral vascular disease, unspecified: Secondary | ICD-10-CM

## 2019-10-30 DIAGNOSIS — T8189XS Other complications of procedures, not elsewhere classified, sequela: Secondary | ICD-10-CM

## 2019-10-30 NOTE — H&P (View-Only) (Signed)
Office Note     CC:  follow up Requesting Provider:  Binnie Rail, MD  HPI: Mercedes [name] is a 64 y.o. (12/06/1955) female who presents for wound check.  She presented to the hospital with ischemic left lower extremity and emergently underwent thrombectomy with femoral to distal bypass by Dr. Donnetta Hutching on 09/25/2019.  During same hospital admission she required left above-the-knee amputation due to extensive tissue loss.  After discharge she fell onto her AKA stump.  She was seen last week in the office.  At that time I removed staples from medial stump and began wet-to-dry packing as well as a prescription for Keflex.  She returns today with ongoing pain, inflammation, and necrotic skin edges to medial stump.  She is now agreeable to OR debridement.  She is on Eliquis for paroxysmal atrial fibrillation.   Past Medical History:  Diagnosis Date  . Abnormal LFTs    a. in the past when on Lipitor  . Anemia    as a young woman  . Anxiety   . Arnold-Chiari malformation, type I ([place])    MRI brain 02/2010  . CAD (coronary artery disease)    a. NSTEMI 07/2009: LHC - D2 40%, LAD irreg., EF 50% with apical AK (tako-tsubo CM);  b. Inf STEMI (04/2013): Tx Promus DES to CFX;  c. 08/2013 Lexi CL: No ischemia, dist ant/ap/inf-ap infarct, EF  d. 06/2016 NSTEMI with LM disease: s/p CABG on 07/17/2016 w/ LIMA-LAD, SVG-OM, and SVG-dRCA.  [name] DEPRESSION   . Dizziness    ? CVA 01/2010 - carotid dopplers with no ICA stenosis  . GERD (gastroesophageal reflux disease)   . Headache   . HYPERLIPIDEMIA   . HYPERTENSION   . MI (myocardial infarction) ([place])   . PAD (peripheral artery disease) ([place])    s/p L fem pop 11/2013 in setting of 1st toe osteo/gangrene  . Paroxysmal atrial fibrillation (HCC)    a. anticoagulated with Eliquis 10/2014  . 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 RA (rheumatoid arthritis) ([place])    prior tx by Dr. Ouida Sills  . Sjogren's syndrome ([place])  06/02/13   pt denies this (12/01/13)  . SLE (systemic lupus erythematosus) ([place]) dx 05/2013   follows with rheum Ouida Sills)  . Tachy-brady syndrome (HCC)    a. s/p STJ Leadless pacemaker 11-04-2014 Dr Rayann Heman  . 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 Wears dentures   . Wears glasses     Past Surgical History:  Procedure Laterality Date  . ABDOMINAL AORTAGRAM N/A 11/24/2013   Procedure: ABDOMINAL AORTAGRAM WITH BILATERAL RUNOFF WITH POSSIBLE INTERVENTION;  Surgeon: Angelia Mould, MD;  Location: [facility] CATH LAB;  Service: Cardiovascular;  Laterality: N/A;  . ABDOMINAL AORTOGRAM W/LOWER EXTREMITY N/A 04/25/2019   Procedure: ABDOMINAL AORTOGRAM W/LOWER EXTREMITY;  Surgeon: Elam Dutch, MD;  Location: [place] CV LAB;  Service: Cardiovascular;  Laterality: N/A;  . ABDOMINAL AORTOGRAM W/LOWER EXTREMITY Bilateral 05/15/2019   Procedure: ABDOMINAL AORTOGRAM W/LOWER EXTREMITY;  Surgeon: Serafina Mitchell, MD;  Location: [place] CV LAB;  Service: Cardiovascular;  Laterality: Bilateral;  . AMPUTATION Left 12/02/2013   Procedure: LEFT 1ST TOE AMPUTATION;  Surgeon: Elam Dutch, MD;  Location: [place];  Service: Vascular;  Laterality: Left;  . AMPUTATION Left 07/16/2019   Procedure: LEFT TRANSMETATARSAL AMPUTATION;  Surgeon: Newt Minion, MD;  Location: [place];  Service: Orthopedics;  Laterality: Left;  . AMPUTATION Left 10/01/2019   Procedure: LEFT AMPUTATION ABOVE KNEE;  Surgeon: Sherren Kerns, MD;  Location: [facility] OR;  Service: Vascular;  Laterality: Left;  . CARDIAC CATHETERIZATION N/A 07/12/2016   Procedure: Left Heart Cath and Coronary Angiography;  Surgeon: Lennette Bihari, MD;  Location: [facility] INVASIVE CV LAB;  Service: Cardiovascular;  Laterality: N/A;  . CARDIAC CATHETERIZATION N/A 07/14/2016   Procedure: Intravascular Ultrasound/IVUS;  Surgeon: Yvonne Kendall, MD;  Location: [facility] INVASIVE CV LAB;  Service:  Cardiovascular;  Laterality: N/A;  . COLONOSCOPY    . CORONARY ANGIOPLASTY  04/2013  . CORONARY ARTERY BYPASS GRAFT N/A 07/17/2016   Procedure: CORONARY ARTERY BYPASS GRAFTING (CABG)x3 using right greater saphenous vein harvested endoscopiclly and left internal mammary artery;  Surgeon: Kerin Perna, MD;  Location: [facility] OR;  Service: Open Heart Surgery;  Laterality: N/A;  . ENDARTERECTOMY FEMORAL Left 05/12/2019   Procedure: LEFT Femoral Endarterectomy;  Surgeon: Sherren Kerns, MD;  Location: [facility] OR;  Service: Vascular;  Laterality: Left;  . FEMORAL-POPLITEAL BYPASS GRAFT Left 12/02/2013   Procedure:  LEFT FEMORAL-BELOW KNEE POPLITEAL ARTERY BYPASS GRAFT;  Surgeon: Sherren Kerns, MD;  Location: [facility] OR;  Service: Vascular;  Laterality: Left;  . FEMORAL-POPLITEAL BYPASS GRAFT Left 09/25/2019   Procedure: THROMBECTOMY and PROPATEN BYPASS  FEMORAL TO BELOW KNEE POPLITEAL ARTERY BYPASS GRAFT LEFT LEG;  Surgeon: Larina Earthly, MD;  Location: [facility] OR;  Service: Vascular;  Laterality: Left;  . LEFT HEART CATHETERIZATION WITH CORONARY ANGIOGRAM N/A 05/19/2013   Procedure: LEFT HEART CATHETERIZATION WITH CORONARY ANGIOGRAM;  Surgeon: Micheline Chapman, MD;  Location: [facility] CATH LAB;  Service: Cardiovascular;  Laterality: N/A;  . LEFT HEART CATHETERIZATION WITH CORONARY ANGIOGRAM N/A 10/28/2014   Procedure: LEFT HEART CATHETERIZATION WITH CORONARY ANGIOGRAM;  Surgeon: Iran Ouch, MD;  Location: [facility] CATH LAB;  Service: Cardiovascular;  Laterality: N/A;  . LOWER EXTREMITY ANGIOGRAM Left 05/12/2019   Procedure: Lower Extremity Angiogram;  Surgeon: Sherren Kerns, MD;  Location: [facility] OR;  Service: Vascular;  Laterality: Left;  . PATCH ANGIOPLASTY Left 05/12/2019   Procedure: Left Femoral Patch Angioplasty USING CADAVERIC SAPHENOUS VEIN;  Surgeon: Sherren Kerns, MD;  Location: [facility] OR;  Service: Vascular;  Laterality: Left;  . PERCUTANEOUS CORONARY STENT INTERVENTION (PCI-S)  05/19/2013   Procedure: PERCUTANEOUS  CORONARY STENT INTERVENTION (PCI-S);  Surgeon: Micheline Chapman, MD;  Location: [facility] CATH LAB;  Service: Cardiovascular;;  . PERMANENT PACEMAKER INSERTION N/A 11/04/2014   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  . TEE WITHOUT CARDIOVERSION N/A 07/17/2016   Procedure: TRANSESOPHAGEAL ECHOCARDIOGRAM (TEE);  Surgeon: Kerin Perna, MD;  Location: [facility] OR;  Service: Open Heart Surgery;  Laterality: N/A;  . THROMBECTOMY FEMORAL ARTERY Left 05/12/2019   Procedure: Left Ilio-Femoral Artery and Femoral-popliteal Graft Thrombectomy;  Surgeon: Sherren Kerns, MD;  Location: [facility] OR;  Service: Vascular;  Laterality: Left;  . TUBAL LIGATION    . VISCERAL ANGIOGRAPHY N/A 04/25/2019   Procedure: MESENTRIC  ANGIOGRAPHY;  Surgeon: Sherren Kerns, MD;  Location: [facility] INVASIVE CV LAB;  Service: Cardiovascular;  Laterality: N/A;    Social History   Socioeconomic History  . Marital status: Single    Spouse name: Not on file  . Number of children: 3  . Years of education: Not on file  . Highest education level: Not on file  Occupational History  . Occupation: retired  Tobacco Use  . Smoking status: Former Smoker    Types:  Cigarettes    Quit date: 05/19/2016    Years since quitting: 3.4  . Smokeless tobacco: Never Used  . Tobacco comment:    Substance and Sexual Activity  . Alcohol use: No    Alcohol/week: 0.0 standard drinks  . Drug use: Not Currently    Types: Marijuana  . Sexual activity: Not on file  Other Topics Concern  . Not on file  Social History Narrative   Lives alone.   Social Determinants of Health   Financial Resource Strain:   . Difficulty of Paying Living Expenses: Not on file  Food Insecurity:   . Worried About Programme researcher, broadcasting/film/video in the Last Year: Not on file  . Ran Out of Food in the Last Year: Not on file  Transportation Needs:   . Lack of Transportation (Medical): Not on file  . Lack of Transportation (Non-Medical): Not on file  Physical Activity:    . Days of Exercise per Week: Not on file  . Minutes of Exercise per Session: Not on file  Stress:   . Feeling of Stress : Not on file  Social Connections:   . Frequency of Communication with Friends and Family: Not on file  . Frequency of Social Gatherings with Friends and Family: Not on file  . Attends Religious Services: Not on file  . Active Member of Clubs or Organizations: Not on file  . Attends Banker Meetings: Not on file  . Marital Status: Not on file  Intimate Partner Violence:   . Fear of Current or Ex-Partner: Not on file  . Emotionally Abused: Not on file  . Physically Abused: Not on file  . Sexually Abused: Not on file    Family History  Problem Relation Age of Onset  . Arthritis Mother   . Emphysema Mother   . Arthritis Father   . COPD Father   . Heart disease Father   . Diabetes Paternal Aunt        paternal Aunts  . Heart disease Paternal Aunt   . Thyroid disease Neg Hx     Current Outpatient Medications  Medication Sig Dispense Refill  . cephALEXin (KEFLEX) 500 MG capsule Take 1 capsule (500 mg total) by mouth 2 (two) times daily. 14 capsule 0  . DULoxetine (CYMBALTA) 30 MG capsule Take 1 capsule (30 mg total) by mouth daily. Qam (Patient taking differently: Take 30 mg by mouth daily. ) 30 capsule 0  . ELIQUIS 5 MG TABS tablet TAKE 1 TABLET BY MOUTH TWICE DAILY (Patient taking differently: Take 5 mg by mouth 2 (two) times daily. ) 180 tablet 3  . lisinopril (ZESTRIL) 10 MG tablet Take 1 tablet (10 mg total) by mouth daily. 90 tablet 2  . metoprolol tartrate (LOPRESSOR) 25 MG tablet Take 0.5 tablets (12.5 mg total) by mouth 2 (two) times daily. 180 tablet 3  . Multiple Vitamin (MULTIVITAMIN WITH MINERALS) TABS tablet Take 1 tablet by mouth daily. (Patient taking differently: Take 1 tablet by mouth 3 (three) times a week. )    . nitroGLYCERIN (NITROSTAT) 0.4 MG SL tablet Place 1 tablet (0.4 mg total) under the tongue every 5 (five) minutes as  needed for chest pain. 25 tablet 3  . rosuvastatin (CRESTOR) 40 MG tablet Take 1 tablet (40 mg total) by mouth daily. 90 tablet 3  . acetaminophen (TYLENOL) 500 MG tablet Take 500-1,000 mg by mouth every 8 (eight) hours as needed for mild pain or headache.    . oxyCODONE (  oxyCODONE (OXY IR/ROXICODONE) 5 MG immediate release tablet Take 1 tablet (5 mg total) by mouth every 6 (six) hours as needed for severe pain. (Patient not taking: Reported on 10/30/2019) 20 tablet 0   No current facility-administered medications for this visit.    Allergies  Allergen Reactions  . Brilinta [Ticagrelor] Anaphylaxis and Other (See Comments)    Also, chest tightness  . Latex Rash  . Tape Rash     REVIEW OF SYSTEMS:   [X] denotes positive finding, [ ] denotes negative finding Cardiac  Comments:  Chest pain or chest pressure:    Shortness of breath upon exertion:    Short of breath when lying flat:    Irregular heart rhythm:        Vascular    Pain in calf, thigh, or hip brought on by ambulation:    Pain in feet at night that wakes you up from your sleep:     Blood clot in your veins:    Leg swelling:         Pulmonary    Oxygen at home:    Productive cough:     Wheezing:         Neurologic    Sudden weakness in arms or legs:     Sudden numbness in arms or legs:     Sudden onset of difficulty speaking or slurred speech:    Temporary loss of vision in one eye:     Problems with dizziness:         Gastrointestinal    Blood in stool:     Vomited blood:         Genitourinary    Burning when urinating:     Blood in urine:        Psychiatric    Major depression:         Hematologic    Bleeding problems:    Problems with blood clotting too easily:        Skin    Rashes or ulcers:        Constitutional    Fever or chills:      PHYSICAL EXAMINATION:  Vitals:   10/30/19 1302  BP: 106/73  Pulse: 87  Resp: 14  Temp: (!) 97.2 F (36.2 C)  TempSrc: Temporal  Weight: 106 lb (48.1 kg)    Height: 5' 6" (1.676 m)    General:  WDWN in NAD; vital signs documented above Gait: Not observed HENT: WNL, normocephalic Pulmonary: normal non-labored breathing , without Rales, rhonchi,  wheezing Cardiac: regular HR Abdomen: soft, NT, no masses Skin: without rashes Extremities: Left medial AKA incision with necrotic skin edges, erythematous, and purulent drainage; femur lies laterally and does not appear to communicate with nonhealing wound Musculoskeletal: no muscle wasting or atrophy  Neurologic: A&O X 3;  No focal weakness or paresthesias are detected Psychiatric:  The pt has Normal affect.    ASSESSMENT/PLAN:: 63 y.o. female here for follow up for nonhealing AKA after traumatic fall onto stump about 2 weeks ago  -Despite wound care and antibiotics, AKA is not healing -Plan will be for debridement of above-the-knee amputation on this coming Monday 3/8 with Dr. Fields; the femur lies a fair distance away from the medial aspect of the incision however I discussed the possibility that the bone may also need to be shortened -We will hold off on staple removal today as this can be done under anesthesia -Continued at least twice daily cleansing of wound     Deeksha Cotrell, PA-C [facility] [phone]  Clinic MD:   Fields   

## 2019-10-30 NOTE — Progress Notes (Signed)
Office Note     CC:  follow up Requesting Provider:  Binnie Rail, MD  HPI: Mercedes [name] is a 64 y.o. (12/06/1955) female who presents for wound check.  She presented to the hospital with ischemic left lower extremity and emergently underwent thrombectomy with femoral to distal bypass by Dr. Donnetta Hutching on 09/25/2019.  During same hospital admission she required left above-the-knee amputation due to extensive tissue loss.  After discharge she fell onto her AKA stump.  She was seen last week in the office.  At that time I removed staples from medial stump and began wet-to-dry packing as well as a prescription for Keflex.  She returns today with ongoing pain, inflammation, and necrotic skin edges to medial stump.  She is now agreeable to OR debridement.  She is on Eliquis for paroxysmal atrial fibrillation.   Past Medical History:  Diagnosis Date  . Abnormal LFTs    a. in the past when on Lipitor  . Anemia    as a young woman  . Anxiety   . Arnold-Chiari malformation, type I ([place])    MRI brain 02/2010  . CAD (coronary artery disease)    a. NSTEMI 07/2009: LHC - D2 40%, LAD irreg., EF 50% with apical AK (tako-tsubo CM);  b. Inf STEMI (04/2013): Tx Promus DES to CFX;  c. 08/2013 Lexi CL: No ischemia, dist ant/ap/inf-ap infarct, EF  d. 06/2016 NSTEMI with LM disease: s/p CABG on 07/17/2016 w/ LIMA-LAD, SVG-OM, and SVG-dRCA.  [name] DEPRESSION   . Dizziness    ? CVA 01/2010 - carotid dopplers with no ICA stenosis  . GERD (gastroesophageal reflux disease)   . Headache   . HYPERLIPIDEMIA   . HYPERTENSION   . MI (myocardial infarction) ([place])   . PAD (peripheral artery disease) ([place])    s/p L fem pop 11/2013 in setting of 1st toe osteo/gangrene  . Paroxysmal atrial fibrillation (HCC)    a. anticoagulated with Eliquis 10/2014  . 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 RA (rheumatoid arthritis) ([place])    prior tx by Dr. Ouida Sills  . Sjogren's syndrome ([place])  06/02/13   pt denies this (12/01/13)  . SLE (systemic lupus erythematosus) ([place]) dx 05/2013   follows with rheum Ouida Sills)  . Tachy-brady syndrome (HCC)    a. s/p STJ Leadless pacemaker 11-04-2014 Dr Rayann Heman  . 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 Wears dentures   . Wears glasses     Past Surgical History:  Procedure Laterality Date  . ABDOMINAL AORTAGRAM N/A 11/24/2013   Procedure: ABDOMINAL AORTAGRAM WITH BILATERAL RUNOFF WITH POSSIBLE INTERVENTION;  Surgeon: Angelia Mould, MD;  Location: [facility] CATH LAB;  Service: Cardiovascular;  Laterality: N/A;  . ABDOMINAL AORTOGRAM W/LOWER EXTREMITY N/A 04/25/2019   Procedure: ABDOMINAL AORTOGRAM W/LOWER EXTREMITY;  Surgeon: Elam Dutch, MD;  Location: [place] CV LAB;  Service: Cardiovascular;  Laterality: N/A;  . ABDOMINAL AORTOGRAM W/LOWER EXTREMITY Bilateral 05/15/2019   Procedure: ABDOMINAL AORTOGRAM W/LOWER EXTREMITY;  Surgeon: Serafina Mitchell, MD;  Location: [place] CV LAB;  Service: Cardiovascular;  Laterality: Bilateral;  . AMPUTATION Left 12/02/2013   Procedure: LEFT 1ST TOE AMPUTATION;  Surgeon: Elam Dutch, MD;  Location: [place];  Service: Vascular;  Laterality: Left;  . AMPUTATION Left 07/16/2019   Procedure: LEFT TRANSMETATARSAL AMPUTATION;  Surgeon: Newt Minion, MD;  Location: [place];  Service: Orthopedics;  Laterality: Left;  . AMPUTATION Left 10/01/2019   Procedure: LEFT AMPUTATION ABOVE KNEE;  Surgeon: Sherren Kerns, MD;  Location: [facility] OR;  Service: Vascular;  Laterality: Left;  . CARDIAC CATHETERIZATION N/A 07/12/2016   Procedure: Left Heart Cath and Coronary Angiography;  Surgeon: Lennette Bihari, MD;  Location: [facility] INVASIVE CV LAB;  Service: Cardiovascular;  Laterality: N/A;  . CARDIAC CATHETERIZATION N/A 07/14/2016   Procedure: Intravascular Ultrasound/IVUS;  Surgeon: Yvonne Kendall, MD;  Location: [facility] INVASIVE CV LAB;  Service:  Cardiovascular;  Laterality: N/A;  . COLONOSCOPY    . CORONARY ANGIOPLASTY  04/2013  . CORONARY ARTERY BYPASS GRAFT N/A 07/17/2016   Procedure: CORONARY ARTERY BYPASS GRAFTING (CABG)x3 using right greater saphenous vein harvested endoscopiclly and left internal mammary artery;  Surgeon: Kerin Perna, MD;  Location: [facility] OR;  Service: Open Heart Surgery;  Laterality: N/A;  . ENDARTERECTOMY FEMORAL Left 05/12/2019   Procedure: LEFT Femoral Endarterectomy;  Surgeon: Sherren Kerns, MD;  Location: [facility] OR;  Service: Vascular;  Laterality: Left;  . FEMORAL-POPLITEAL BYPASS GRAFT Left 12/02/2013   Procedure:  LEFT FEMORAL-BELOW KNEE POPLITEAL ARTERY BYPASS GRAFT;  Surgeon: Sherren Kerns, MD;  Location: [facility] OR;  Service: Vascular;  Laterality: Left;  . FEMORAL-POPLITEAL BYPASS GRAFT Left 09/25/2019   Procedure: THROMBECTOMY and PROPATEN BYPASS  FEMORAL TO BELOW KNEE POPLITEAL ARTERY BYPASS GRAFT LEFT LEG;  Surgeon: Larina Earthly, MD;  Location: [facility] OR;  Service: Vascular;  Laterality: Left;  . LEFT HEART CATHETERIZATION WITH CORONARY ANGIOGRAM N/A 05/19/2013   Procedure: LEFT HEART CATHETERIZATION WITH CORONARY ANGIOGRAM;  Surgeon: Micheline Chapman, MD;  Location: [facility] CATH LAB;  Service: Cardiovascular;  Laterality: N/A;  . LEFT HEART CATHETERIZATION WITH CORONARY ANGIOGRAM N/A 10/28/2014   Procedure: LEFT HEART CATHETERIZATION WITH CORONARY ANGIOGRAM;  Surgeon: Iran Ouch, MD;  Location: [facility] CATH LAB;  Service: Cardiovascular;  Laterality: N/A;  . LOWER EXTREMITY ANGIOGRAM Left 05/12/2019   Procedure: Lower Extremity Angiogram;  Surgeon: Sherren Kerns, MD;  Location: [facility] OR;  Service: Vascular;  Laterality: Left;  . PATCH ANGIOPLASTY Left 05/12/2019   Procedure: Left Femoral Patch Angioplasty USING CADAVERIC SAPHENOUS VEIN;  Surgeon: Sherren Kerns, MD;  Location: [facility] OR;  Service: Vascular;  Laterality: Left;  . PERCUTANEOUS CORONARY STENT INTERVENTION (PCI-S)  05/19/2013   Procedure: PERCUTANEOUS  CORONARY STENT INTERVENTION (PCI-S);  Surgeon: Micheline Chapman, MD;  Location: [facility] CATH LAB;  Service: Cardiovascular;;  . PERMANENT PACEMAKER INSERTION N/A 11/04/2014   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  . TEE WITHOUT CARDIOVERSION N/A 07/17/2016   Procedure: TRANSESOPHAGEAL ECHOCARDIOGRAM (TEE);  Surgeon: Kerin Perna, MD;  Location: [facility] OR;  Service: Open Heart Surgery;  Laterality: N/A;  . THROMBECTOMY FEMORAL ARTERY Left 05/12/2019   Procedure: Left Ilio-Femoral Artery and Femoral-popliteal Graft Thrombectomy;  Surgeon: Sherren Kerns, MD;  Location: [facility] OR;  Service: Vascular;  Laterality: Left;  . TUBAL LIGATION    . VISCERAL ANGIOGRAPHY N/A 04/25/2019   Procedure: MESENTRIC  ANGIOGRAPHY;  Surgeon: Sherren Kerns, MD;  Location: [facility] INVASIVE CV LAB;  Service: Cardiovascular;  Laterality: N/A;    Social History   Socioeconomic History  . Marital status: Single    Spouse name: Not on file  . Number of children: 3  . Years of education: Not on file  . Highest education level: Not on file  Occupational History  . Occupation: retired  Tobacco Use  . Smoking status: Former Smoker    Types:  Cigarettes    Quit date: 05/19/2016    Years since quitting: 3.4  . Smokeless tobacco: Never Used  . Tobacco comment:    Substance and Sexual Activity  . Alcohol use: No    Alcohol/week: 0.0 standard drinks  . Drug use: Not Currently    Types: Marijuana  . Sexual activity: Not on file  Other Topics Concern  . Not on file  Social History Narrative   Lives alone.   Social Determinants of Health   Financial Resource Strain:   . Difficulty of Paying Living Expenses: Not on file  Food Insecurity:   . Worried About Programme researcher, broadcasting/film/video in the Last Year: Not on file  . Ran Out of Food in the Last Year: Not on file  Transportation Needs:   . Lack of Transportation (Medical): Not on file  . Lack of Transportation (Non-Medical): Not on file  Physical Activity:     . Days of Exercise per Week: Not on file  . Minutes of Exercise per Session: Not on file  Stress:   . Feeling of Stress : Not on file  Social Connections:   . Frequency of Communication with Friends and Family: Not on file  . Frequency of Social Gatherings with Friends and Family: Not on file  . Attends Religious Services: Not on file  . Active Member of Clubs or Organizations: Not on file  . Attends Banker Meetings: Not on file  . Marital Status: Not on file  Intimate Partner Violence:   . Fear of Current or Ex-Partner: Not on file  . Emotionally Abused: Not on file  . Physically Abused: Not on file  . Sexually Abused: Not on file    Family History  Problem Relation Age of Onset  . Arthritis Mother   . Emphysema Mother   . Arthritis Father   . COPD Father   . Heart disease Father   . Diabetes Paternal Aunt        paternal Aunts  . Heart disease Paternal Aunt   . Thyroid disease Neg Hx     Current Outpatient Medications  Medication Sig Dispense Refill  . cephALEXin (KEFLEX) 500 MG capsule Take 1 capsule (500 mg total) by mouth 2 (two) times daily. 14 capsule 0  . DULoxetine (CYMBALTA) 30 MG capsule Take 1 capsule (30 mg total) by mouth daily. Qam (Patient taking differently: Take 30 mg by mouth daily. ) 30 capsule 0  . ELIQUIS 5 MG TABS tablet TAKE 1 TABLET BY MOUTH TWICE DAILY (Patient taking differently: Take 5 mg by mouth 2 (two) times daily. ) 180 tablet 3  . lisinopril (ZESTRIL) 10 MG tablet Take 1 tablet (10 mg total) by mouth daily. 90 tablet 2  . metoprolol tartrate (LOPRESSOR) 25 MG tablet Take 0.5 tablets (12.5 mg total) by mouth 2 (two) times daily. 180 tablet 3  . Multiple Vitamin (MULTIVITAMIN WITH MINERALS) TABS tablet Take 1 tablet by mouth daily. (Patient taking differently: Take 1 tablet by mouth 3 (three) times a week. )    . nitroGLYCERIN (NITROSTAT) 0.4 MG SL tablet Place 1 tablet (0.4 mg total) under the tongue every 5 (five) minutes as  needed for chest pain. 25 tablet 3  . rosuvastatin (CRESTOR) 40 MG tablet Take 1 tablet (40 mg total) by mouth daily. 90 tablet 3  . acetaminophen (TYLENOL) 500 MG tablet Take 500-1,000 mg by mouth every 8 (eight) hours as needed for mild pain or headache.    [name]  oxyCODONE (OXY IR/ROXICODONE) 5 MG immediate release tablet Take 1 tablet (5 mg total) by mouth every 6 (six) hours as needed for severe pain. (Patient not taking: Reported on 10/30/2019) 20 tablet 0   No current facility-administered medications for this visit.    Allergies  Allergen Reactions  . Brilinta [Ticagrelor] Anaphylaxis and Other (See Comments)    Also, chest tightness  . Latex Rash  . Tape Rash     REVIEW OF SYSTEMS:   [X]  denotes positive finding, [ ]  denotes negative finding Cardiac  Comments:  Chest pain or chest pressure:    Shortness of breath upon exertion:    Short of breath when lying flat:    Irregular heart rhythm:        Vascular    Pain in calf, thigh, or hip brought on by ambulation:    Pain in feet at night that wakes you up from your sleep:     Blood clot in your veins:    Leg swelling:         Pulmonary    Oxygen at home:    Productive cough:     Wheezing:         Neurologic    Sudden weakness in arms or legs:     Sudden numbness in arms or legs:     Sudden onset of difficulty speaking or slurred speech:    Temporary loss of vision in one eye:     Problems with dizziness:         Gastrointestinal    Blood in stool:     Vomited blood:         Genitourinary    Burning when urinating:     Blood in urine:        Psychiatric    Major depression:         Hematologic    Bleeding problems:    Problems with blood clotting too easily:        Skin    Rashes or ulcers:        Constitutional    Fever or chills:      PHYSICAL EXAMINATION:  Vitals:   10/30/19 1302  BP: 106/73  Pulse: 87  Resp: 14  Temp: (!) 97.2 F (36.2 C)  TempSrc: Temporal  Weight: 106 lb (48.1 kg)    Height: 5\' 6"  (1.676 m)    General:  WDWN in NAD; vital signs documented above Gait: Not observed HENT: WNL, normocephalic Pulmonary: normal non-labored breathing , without Rales, rhonchi,  wheezing Cardiac: regular HR Abdomen: soft, NT, no masses Skin: without rashes Extremities: Left medial AKA incision with necrotic skin edges, erythematous, and purulent drainage; femur lies laterally and does not appear to communicate with nonhealing wound Musculoskeletal: no muscle wasting or atrophy  Neurologic: A&O X 3;  No focal weakness or paresthesias are detected Psychiatric:  The pt has Normal affect.    ASSESSMENT/PLAN:: 64 y.o. female here for follow up for nonhealing AKA after traumatic fall onto stump about 2 weeks ago  -Despite wound care and antibiotics, AKA is not healing -Plan will be for debridement of above-the-knee amputation on this coming Monday 3/8 with Dr. 76; the femur lies a fair distance away from the medial aspect of the incision however I discussed the possibility that the bone may also need to be shortened -We will hold off on staple removal today as this can be done under anesthesia -Continued at least twice daily cleansing of wound  Emilie Rutter, PA-C [facility] [phone]  Clinic MD:   Darrick Penna

## 2019-10-31 ENCOUNTER — Other Ambulatory Visit: Payer: Self-pay

## 2019-10-31 ENCOUNTER — Other Ambulatory Visit (HOSPITAL_COMMUNITY): Payer: Medicare Other

## 2019-10-31 ENCOUNTER — Other Ambulatory Visit: Payer: Self-pay | Admitting: Nurse Practitioner

## 2019-10-31 ENCOUNTER — Encounter (HOSPITAL_COMMUNITY): Payer: Self-pay | Admitting: Vascular Surgery

## 2019-10-31 NOTE — Progress Notes (Signed)
Pt denies any acute cardiopulmonary issues. Pt stated that she is under the care of Dr. Jens Som, Cardiology Dr. Johney Frame, Cardiology (EP) and Dr. Cheryll Cockayne, PCP. Pt stated that last dose of Eliquis was 10/30/19 in the morning . Pt made aware to stop taking  vitamins, fish oil, CBD oil and herbal medications. Do not take any NSAIDs ie: Ibuprofen, Advil, Naproxen (Aleve), Motrin, BC and Goody Powder. Peri-op prescription for pacemaker initiated; awaiting response. Pt reminded to quarantine. Pt verbalized understanding of all pre-op instructions. See PA, Anesthesiology, note.

## 2019-10-31 NOTE — Anesthesia Preprocedure Evaluation (Addendum)
Anesthesia Evaluation  Patient identified by MRN, date of birth, ID band Patient awake    Reviewed: Allergy & Precautions, NPO status , Patient's Chart, lab work & pertinent test results  Airway Mallampati: II   Neck ROM: Full    Dental  (+) Edentulous Upper, Partial Lower, Dental Advisory Given   Pulmonary Current Smoker and Patient abstained from smoking., former smoker,    breath sounds clear to auscultation       Cardiovascular hypertension,  Rhythm:Regular Rate:Normal     Neuro/Psych    GI/Hepatic   Endo/Other    Renal/GU      Musculoskeletal   Abdominal   Peds  Hematology   Anesthesia Other Findings   Reproductive/Obstetrics                           Anesthesia Physical Anesthesia Plan  ASA: III  Anesthesia Plan: General   Post-op Pain Management:    Induction: Intravenous  PONV Risk Score and Plan: Ondansetron  Airway Management Planned: LMA  Additional Equipment:   Intra-op Plan:   Post-operative Plan: Extubation in OR  Informed Consent: I have reviewed the patients History and Physical, chart, labs and discussed the procedure including the risks, benefits and alternatives for the proposed anesthesia with the patient or authorized representative who has indicated his/her understanding and acceptance.     Dental advisory given  Plan Discussed with: CRNA and Anesthesiologist  Anesthesia Plan Comments: (History of severe PAD (s/p left FPBG, left first toe amputation 12/02/13;acute/subacute occlusions/p thrombectomy 05/12/19, left transmetatarsal amputation 07/16/19). Recently underwent thrombectomy with femoral to distal bypass by Dr. Arbie Cookey on 09/25/2019.  During same hospital admission she required left above-the-knee amputation due to extensive tissue loss.  Follows with cardiology for hx of CAD (NSTEMI 07/2009,Takotsubo cardiomyopathy; inferior STEMI s/p DES CX  04/2013; NSTEMI 06/2016, s/p CABG: LIMA-LAD, SVG-OM, SVG-dRCA 07/17/16), tachy-brady syndrome (s/p St. Jude Leadless PPM 11/04/14, not responding to magnet 09/2018, patient chose not to replace), PAF, PAD, HTN.  Prior to surgery 09/25/19 she had cardiac clearance per telephone encounter 09/23/19, "She was recently seen in the office by Dr. Jens Som on 09/18/19. She has a history of CABG and PAF. She was having no anginal symptoms. She was remaining in sinus rhythm on beta blocker. She has a pacemaker. Given her current clinical status,based on ACC/AHA guidelines,Mercedes L McCollumwould be at acceptable risk for the planned procedure without further cardiovascular testing. Pt takes Eliquis for afib with CHADS2VASc score of 5-7 (age, sex, HTN, CAD/PVD, DM, ? CVA). Pt admitted 02/18/10 with dizziness, head CT stated"68mm vague hypodensity posterior right frontal love of uncertain chronicity. A small lacunar infarction, age indeterminate, cannot be excluded."SCr 0.91, CrCl 38mL/min. Ok to hold Eliquis for 24 hours pre-op, resume as soon as possible after given ?hx of stroke and elevated cardiac risk."  Pt also follows with EP cardiology for history tachy/brady syndrome s/p St Jude leadless PPM 2016. She was last seen Feb 2020 and at that time device was at RRT and not responding appropriately to magnet. It was decided to pursue watchful waiting as pt was doing well overall. I previously reached out to EP cardiology APP [name], NP prior to pt's endarterectomy on 05/12/19. Per [facility], the pt did not need any further EP eval prior to surgery and nothing special needed to be done perioperatively as the pt has elected to not replace the device due to absence of symptoms.   Will need DOS labs  and eval.  EKG 05/07/2019: Normal sinus rhythm. Rate 76. Left anterior fascicular block. Nonspecific T wave abnormality  TEE (intra-op CABG 07/17/16): Left ventricle: Normal cavity size. Concentric hypertrophy of mild  severity. LV systolic function is normal with an EF of 60-65%. There are no obvious wall motion abnormalities. No thrombus present. No mass present. Aortic valve: No AV vegetation. Mitral valve: No leaflet thickening and calcification present. Trace regurgitation. Right ventricle: Normal cavity size, wall thickness and ejection fraction. Tricuspid valve: Trace regurgitation. The tricuspid valve regurgitation jet is central. Pulmonic valve: Trace regurgitation. )       Anesthesia Quick Evaluation

## 2019-10-31 NOTE — Progress Notes (Signed)
Anesthesia Chart Review: Same day workup  History of severe PAD (s/p left FPBG, left first toe amputation 12/02/13;acute/subacute occlusions/p thrombectomy 05/12/19, left transmetatarsal amputation 07/16/19). Recently underwent thrombectomy with femoral to distal bypass by Dr. Arbie Cookey on 09/25/2019.  During same hospital admission she required left above-the-knee amputation due to extensive tissue loss.  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  Prior to surgery 09/25/19 she had cardiac clearance per telephone encounter 09/23/19, "She was recently seen in the office by Dr. Jens Som on 09/18/19. She has a history of CABG and PAF. She was having no anginal symptoms. She was remaining in sinus rhythm on beta blocker. She has a pacemaker. Given her current clinical status,based on ACC/AHA guidelines,Jo-Anne L McCollumwould be at acceptable risk for the planned procedure without further cardiovascular testing. Pt takes Eliquis for afib with CHADS2VASc score of 5-7 (age, sex, HTN, CAD/PVD, DM, ? CVA). Pt admitted 02/18/10 with dizziness, head CT stated"16mm vague hypodensity posterior right frontal love of uncertain chronicity. A small lacunar infarction, age indeterminate, cannot be excluded."SCr 0.91, CrCl 28mL/min. Ok to hold Eliquis for 24 hours pre-op, resume as soon as possible after given ?hx of stroke and elevated cardiac risk."  Pt also follows with EP cardiology for history tachy/brady syndrome s/p St Jude leadless PPM 2016. She was last seen Feb 2020 and at that time device was at RRT and not responding appropriately to magnet. It was decided to pursue watchful waiting as pt was doing well overall. I previously reached out to EP  cardiology APP [name], NP prior to pt's endarterectomy on 05/12/19. Per [facility], the pt did not need any further EP eval prior to surgery and nothing special needed to be done perioperatively as the pt has elected to not replace the device due to absence of symptoms.   Will need DOS labs and eval.  EKG 05/07/2019: Normal sinus rhythm. Rate 76. Left anterior fascicular block. Nonspecific T wave abnormality  TEE (intra-op CABG 07/17/16):  Left ventricle: Normal cavity size. Concentric hypertrophy of mild severity. LV systolic function is normal with an EF of 60-65%. There are no obvious wall motion abnormalities. No thrombus present. No mass present.  Aortic valve: No AV vegetation.  Mitral valve: No leaflet thickening and calcification present. Trace regurgitation.  Right ventricle: Normal cavity size, wall thickness and ejection fraction.  Tricuspid valve: Trace regurgitation. The tricuspid valve regurgitation jet is central.  Pulmonic valve: Trace regurgitation.   [name] Short Stay Center/Anesthesiology Phone [phone] 10/31/2019 10:01 AM

## 2019-11-01 ENCOUNTER — Other Ambulatory Visit (HOSPITAL_COMMUNITY)
Admission: RE | Admit: 2019-11-01 | Discharge: 2019-11-01 | Disposition: A | Discharge status: Home / Self Care | Payer: Medicare Other | Source: Ambulatory Visit | Attending: Vascular Surgery | Admitting: Vascular Surgery

## 2019-11-01 DIAGNOSIS — Z20822 Contact with and (suspected) exposure to covid-19: Secondary | ICD-10-CM | POA: Diagnosis not present

## 2019-11-01 DIAGNOSIS — Z01812 Encounter for preprocedural laboratory examination: Secondary | ICD-10-CM | POA: Insufficient documentation

## 2019-11-01 LAB — SARS CORONAVIRUS 2 (TAT 6-24 HRS): SARS Coronavirus 2: NEGATIVE

## 2019-11-03 ENCOUNTER — Other Ambulatory Visit: Payer: Self-pay

## 2019-11-03 ENCOUNTER — Encounter (HOSPITAL_COMMUNITY)
Admission: RE | Disposition: A | Discharge status: Home / Self Care | Payer: Self-pay | Source: Home / Self Care | Attending: Vascular Surgery

## 2019-11-03 ENCOUNTER — Observation Stay (HOSPITAL_COMMUNITY)
Admission: RE | Admit: 2019-11-03 | Discharge: 2019-11-04 | Disposition: A | Discharge status: Home / Self Care | Payer: Medicare Other | Attending: Vascular Surgery | Admitting: Vascular Surgery

## 2019-11-03 ENCOUNTER — Inpatient Hospital Stay (HOSPITAL_COMMUNITY): Payer: Medicare Other | Admitting: Physician Assistant

## 2019-11-03 ENCOUNTER — Encounter (HOSPITAL_COMMUNITY): Payer: Self-pay | Admitting: Vascular Surgery

## 2019-11-03 DIAGNOSIS — E1122 Type 2 diabetes mellitus with diabetic chronic kidney disease: Secondary | ICD-10-CM | POA: Insufficient documentation

## 2019-11-03 DIAGNOSIS — Z950 Presence of cardiac pacemaker: Secondary | ICD-10-CM | POA: Diagnosis not present

## 2019-11-03 DIAGNOSIS — I2510 Atherosclerotic heart disease of native coronary artery without angina pectoris: Secondary | ICD-10-CM | POA: Insufficient documentation

## 2019-11-03 DIAGNOSIS — I214 Non-ST elevation (NSTEMI) myocardial infarction: Secondary | ICD-10-CM | POA: Diagnosis not present

## 2019-11-03 DIAGNOSIS — T8189XA Other complications of procedures, not elsewhere classified, initial encounter: Principal | ICD-10-CM | POA: Insufficient documentation

## 2019-11-03 DIAGNOSIS — Z87891 Personal history of nicotine dependence: Secondary | ICD-10-CM | POA: Diagnosis not present

## 2019-11-03 DIAGNOSIS — Z951 Presence of aortocoronary bypass graft: Secondary | ICD-10-CM | POA: Insufficient documentation

## 2019-11-03 DIAGNOSIS — I129 Hypertensive chronic kidney disease with stage 1 through stage 4 chronic kidney disease, or unspecified chronic kidney disease: Secondary | ICD-10-CM | POA: Insufficient documentation

## 2019-11-03 DIAGNOSIS — E785 Hyperlipidemia, unspecified: Secondary | ICD-10-CM | POA: Diagnosis not present

## 2019-11-03 DIAGNOSIS — Z79899 Other long term (current) drug therapy: Secondary | ICD-10-CM | POA: Diagnosis not present

## 2019-11-03 DIAGNOSIS — T8789 Other complications of amputation stump: Secondary | ICD-10-CM | POA: Diagnosis not present

## 2019-11-03 DIAGNOSIS — E1151 Type 2 diabetes mellitus with diabetic peripheral angiopathy without gangrene: Secondary | ICD-10-CM | POA: Insufficient documentation

## 2019-11-03 DIAGNOSIS — I252 Old myocardial infarction: Secondary | ICD-10-CM | POA: Insufficient documentation

## 2019-11-03 DIAGNOSIS — Z8673 Personal history of transient ischemic attack (TIA), and cerebral infarction without residual deficits: Secondary | ICD-10-CM | POA: Diagnosis not present

## 2019-11-03 DIAGNOSIS — Y835 Amputation of limb(s) as the cause of abnormal reaction of the patient, or of later complication, without mention of misadventure at the time of the procedure: Secondary | ICD-10-CM | POA: Insufficient documentation

## 2019-11-03 DIAGNOSIS — Z7901 Long term (current) use of anticoagulants: Secondary | ICD-10-CM | POA: Insufficient documentation

## 2019-11-03 DIAGNOSIS — T8189XD Other complications of procedures, not elsewhere classified, subsequent encounter: Secondary | ICD-10-CM

## 2019-11-03 DIAGNOSIS — F329 Major depressive disorder, single episode, unspecified: Secondary | ICD-10-CM | POA: Insufficient documentation

## 2019-11-03 DIAGNOSIS — Z89612 Acquired absence of left leg above knee: Secondary | ICD-10-CM | POA: Diagnosis not present

## 2019-11-03 DIAGNOSIS — N182 Chronic kidney disease, stage 2 (mild): Secondary | ICD-10-CM | POA: Diagnosis not present

## 2019-11-03 DIAGNOSIS — I480 Paroxysmal atrial fibrillation: Secondary | ICD-10-CM | POA: Insufficient documentation

## 2019-11-03 HISTORY — PX: APPLICATION OF WOUND VAC: SHX5189

## 2019-11-03 HISTORY — PX: WOUND DEBRIDEMENT: SHX247

## 2019-11-03 LAB — CBC
HCT: 41.5 % (ref 36.0–46.0)
Hemoglobin: 12.4 g/dL (ref 12.0–15.0)
MCH: 23.9 pg — ABNORMAL LOW (ref 26.0–34.0)
MCHC: 29.9 g/dL — ABNORMAL LOW (ref 30.0–36.0)
MCV: 80 fL (ref 80.0–100.0)
Platelets: 422 10*3/uL — ABNORMAL HIGH (ref 150–400)
RBC: 5.19 MIL/uL — ABNORMAL HIGH (ref 3.87–5.11)
RDW: 17.1 % — ABNORMAL HIGH (ref 11.5–15.5)
WBC: 5 10*3/uL (ref 4.0–10.5)
nRBC: 0 % (ref 0.0–0.2)

## 2019-11-03 LAB — COMPREHENSIVE METABOLIC PANEL
ALT: 10 U/L (ref 0–44)
AST: 20 U/L (ref 15–41)
Albumin: 3.4 g/dL — ABNORMAL LOW (ref 3.5–5.0)
Alkaline Phosphatase: 80 U/L (ref 38–126)
Anion gap: 9 (ref 5–15)
BUN: 10 mg/dL (ref 8–23)
CO2: 24 mmol/L (ref 22–32)
Calcium: 9.6 mg/dL (ref 8.9–10.3)
Chloride: 103 mmol/L (ref 98–111)
Creatinine, Ser: 0.87 mg/dL (ref 0.44–1.00)
GFR calc Af Amer: >60 mL/min (ref >60)
GFR calc non Af Amer: >60 mL/min (ref >60)
Glucose, Bld: 108 mg/dL — ABNORMAL HIGH (ref 70–99)
Potassium: 3.8 mmol/L (ref 3.5–5.1)
Sodium: 136 mmol/L (ref 135–145)
Total Bilirubin: 0.5 mg/dL (ref 0.3–1.2)
Total Protein: 8.4 g/dL — ABNORMAL HIGH (ref 6.5–8.1)

## 2019-11-03 LAB — PROTIME-INR
INR: 1 (ref 0.8–1.2)
Prothrombin Time: 13.1 seconds (ref 11.4–15.2)

## 2019-11-03 LAB — TYPE AND SCREEN
ABO/RH(D): B POS
Antibody Screen: NEGATIVE

## 2019-11-03 LAB — APTT: aPTT: 27 seconds (ref 24–36)

## 2019-11-03 SURGERY — APPLICATION, WOUND VAC
Anesthesia: General | Site: Leg Upper | Laterality: Left

## 2019-11-03 MED ORDER — DEXAMETHASONE SODIUM PHOSPHATE 10 MG/ML IJ SOLN
INTRAMUSCULAR | Status: AC
  Filled 2019-11-03: qty 1

## 2019-11-03 MED ORDER — FENTANYL CITRATE (PF) 100 MCG/2ML IJ SOLN
25.0000 ug | INTRAMUSCULAR | Status: DC | PRN
  Administered 2019-11-03 (×4): 50 ug via INTRAVENOUS

## 2019-11-03 MED ORDER — OXYCODONE HCL 5 MG PO TABS: 5.0000 mg | ORAL_TABLET | Freq: Four times a day (QID) | ORAL | 0 refills | Status: DC | PRN

## 2019-11-03 MED ORDER — PHENYLEPHRINE 40 MCG/ML (10ML) SYRINGE FOR IV PUSH (FOR BLOOD PRESSURE SUPPORT)
PREFILLED_SYRINGE | INTRAVENOUS | Status: DC | PRN
  Administered 2019-11-03: 40 ug via INTRAVENOUS
  Administered 2019-11-03: 80 ug via INTRAVENOUS

## 2019-11-03 MED ORDER — FENTANYL CITRATE (PF) 250 MCG/5ML IJ SOLN
INTRAMUSCULAR | Status: AC
  Filled 2019-11-03: qty 5

## 2019-11-03 MED ORDER — OXYCODONE HCL 5 MG PO TABS: 5.0000 mg | ORAL_TABLET | Freq: Once | ORAL | Status: DC | PRN

## 2019-11-03 MED ORDER — POLYETHYLENE GLYCOL 3350 17 G PO PACK: 17.0000 g | PACK | Freq: Every day | ORAL | Status: DC | PRN

## 2019-11-03 MED ORDER — MIDAZOLAM HCL 2 MG/2ML IJ SOLN
INTRAMUSCULAR | Status: AC
  Filled 2019-11-03: qty 2

## 2019-11-03 MED ORDER — LISINOPRIL 10 MG PO TABS
10.0000 mg | ORAL_TABLET | Freq: Every day | ORAL | Status: DC
  Administered 2019-11-04: 10 mg via ORAL
  Filled 2019-11-03: qty 1

## 2019-11-03 MED ORDER — DULOXETINE HCL 30 MG PO CPEP
30.0000 mg | ORAL_CAPSULE | Freq: Every day | ORAL | Status: DC
  Administered 2019-11-04: 30 mg via ORAL
  Filled 2019-11-03: qty 1

## 2019-11-03 MED ORDER — OXYCODONE HCL 5 MG/5ML PO SOLN: 5.0000 mg | Freq: Once | ORAL | Status: DC | PRN

## 2019-11-03 MED ORDER — ACETAMINOPHEN 325 MG PO TABS
325.0000 mg | ORAL_TABLET | ORAL | Status: DC | PRN
  Administered 2019-11-04: 650 mg via ORAL
  Filled 2019-11-03: qty 2

## 2019-11-03 MED ORDER — PHENOL 1.4 % MT LIQD: 1.0000 | OROMUCOSAL | Status: DC | PRN

## 2019-11-03 MED ORDER — PHENYLEPHRINE 40 MCG/ML (10ML) SYRINGE FOR IV PUSH (FOR BLOOD PRESSURE SUPPORT)
PREFILLED_SYRINGE | INTRAVENOUS | Status: AC
  Filled 2019-11-03: qty 10

## 2019-11-03 MED ORDER — ONDANSETRON HCL 4 MG/2ML IJ SOLN
INTRAMUSCULAR | Status: AC
  Filled 2019-11-03: qty 2

## 2019-11-03 MED ORDER — ACETAMINOPHEN 325 MG RE SUPP: 325.0000 mg | RECTAL | Status: DC | PRN

## 2019-11-03 MED ORDER — OXYCODONE HCL 5 MG PO TABS
5.0000 mg | ORAL_TABLET | ORAL | Status: DC | PRN
  Administered 2019-11-03 – 2019-11-04 (×2): 10 mg via ORAL
  Administered 2019-11-04: 5 mg via ORAL
  Administered 2019-11-04: 10 mg via ORAL
  Filled 2019-11-03 (×4): qty 2

## 2019-11-03 MED ORDER — CHLORHEXIDINE GLUCONATE CLOTH 2 % EX PADS: 6.0000 | MEDICATED_PAD | Freq: Once | CUTANEOUS | Status: DC

## 2019-11-03 MED ORDER — CEFAZOLIN SODIUM-DEXTROSE 2-4 GM/100ML-% IV SOLN
2.0000 g | INTRAVENOUS | Status: AC
  Administered 2019-11-03: 13:00:00 2 g via INTRAVENOUS
  Filled 2019-11-03: qty 100

## 2019-11-03 MED ORDER — OXYCODONE HCL 5 MG PO TABS
ORAL_TABLET | ORAL | Status: AC
  Administered 2019-11-03: 5 mg
  Filled 2019-11-03: qty 1

## 2019-11-03 MED ORDER — NITROGLYCERIN 0.4 MG SL SUBL: 0.4000 mg | SUBLINGUAL_TABLET | SUBLINGUAL | Status: DC | PRN

## 2019-11-03 MED ORDER — LIDOCAINE 2% (20 MG/ML) 5 ML SYRINGE
INTRAMUSCULAR | Status: AC
  Filled 2019-11-03: qty 5

## 2019-11-03 MED ORDER — ROSUVASTATIN CALCIUM 20 MG PO TABS
40.0000 mg | ORAL_TABLET | Freq: Every day | ORAL | Status: DC
  Administered 2019-11-04: 40 mg via ORAL
  Filled 2019-11-03: qty 2

## 2019-11-03 MED ORDER — CEPHALEXIN 500 MG PO CAPS
500.0000 mg | ORAL_CAPSULE | Freq: Two times a day (BID) | ORAL | Status: DC
  Administered 2019-11-03 – 2019-11-04 (×2): 500 mg via ORAL
  Filled 2019-11-03 (×2): qty 1

## 2019-11-03 MED ORDER — SODIUM CHLORIDE 0.9 % IV SOLN: INTRAVENOUS | Status: DC

## 2019-11-03 MED ORDER — METOPROLOL TARTRATE 12.5 MG HALF TABLET
12.5000 mg | ORAL_TABLET | Freq: Two times a day (BID) | ORAL | Status: DC
  Administered 2019-11-04 (×2): 12.5 mg via ORAL
  Filled 2019-11-03 (×2): qty 1

## 2019-11-03 MED ORDER — MORPHINE SULFATE (PF) 2 MG/ML IV SOLN: 2.0000 mg | INTRAVENOUS | Status: DC | PRN

## 2019-11-03 MED ORDER — CEPHALEXIN 500 MG PO CAPS: 500.0000 mg | ORAL_CAPSULE | Freq: Two times a day (BID) | ORAL | 0 refills | Status: DC

## 2019-11-03 MED ORDER — ONDANSETRON HCL 4 MG/2ML IJ SOLN: 4.0000 mg | Freq: Four times a day (QID) | INTRAMUSCULAR | Status: DC | PRN

## 2019-11-03 MED ORDER — ONDANSETRON HCL 4 MG/2ML IJ SOLN
INTRAMUSCULAR | Status: DC | PRN
  Administered 2019-11-03: 4 mg via INTRAVENOUS

## 2019-11-03 MED ORDER — GUAIFENESIN-DM 100-10 MG/5ML PO SYRP: 15.0000 mL | ORAL_SOLUTION | ORAL | Status: DC | PRN

## 2019-11-03 MED ORDER — 0.9 % SODIUM CHLORIDE (POUR BTL) OPTIME
TOPICAL | Status: DC | PRN
  Administered 2019-11-03: 1000 mL

## 2019-11-03 MED ORDER — LIDOCAINE 2% (20 MG/ML) 5 ML SYRINGE
INTRAMUSCULAR | Status: DC | PRN
  Administered 2019-11-03: 50 mg via INTRAVENOUS

## 2019-11-03 MED ORDER — HYDRALAZINE HCL 20 MG/ML IJ SOLN
5.0000 mg | INTRAMUSCULAR | Status: DC | PRN
Start: 2019-11-03 — End: 2019-11-04

## 2019-11-03 MED ORDER — FENTANYL CITRATE (PF) 100 MCG/2ML IJ SOLN
INTRAMUSCULAR | Status: AC
  Filled 2019-11-03: qty 2

## 2019-11-03 MED ORDER — ALUM & MAG HYDROXIDE-SIMETH 200-200-20 MG/5ML PO SUSP
15.0000 mL | ORAL | Status: DC | PRN
Start: 2019-11-03 — End: 2019-11-04

## 2019-11-03 MED ORDER — ROCURONIUM BROMIDE 10 MG/ML (PF) SYRINGE
PREFILLED_SYRINGE | INTRAVENOUS | Status: AC
  Filled 2019-11-03: qty 10

## 2019-11-03 MED ORDER — LABETALOL HCL 5 MG/ML IV SOLN: 10.0000 mg | INTRAVENOUS | Status: DC | PRN

## 2019-11-03 MED ORDER — ONDANSETRON HCL 4 MG/2ML IJ SOLN: 4.0000 mg | Freq: Once | INTRAMUSCULAR | Status: DC | PRN

## 2019-11-03 MED ORDER — PROPOFOL 10 MG/ML IV BOLUS
INTRAVENOUS | Status: DC | PRN
  Administered 2019-11-03: 120 mg via INTRAVENOUS

## 2019-11-03 MED ORDER — FENTANYL CITRATE (PF) 100 MCG/2ML IJ SOLN
INTRAMUSCULAR | Status: AC
  Filled 2019-11-03: qty 4

## 2019-11-03 MED ORDER — LACTATED RINGERS IV SOLN: INTRAVENOUS | Status: DC

## 2019-11-03 MED ORDER — POTASSIUM CHLORIDE CRYS ER 20 MEQ PO TBCR: 20.0000 meq | EXTENDED_RELEASE_TABLET | Freq: Once | ORAL | Status: DC

## 2019-11-03 MED ORDER — PROPOFOL 10 MG/ML IV BOLUS
INTRAVENOUS | Status: AC
  Filled 2019-11-03: qty 20

## 2019-11-03 MED ORDER — FENTANYL CITRATE (PF) 250 MCG/5ML IJ SOLN
INTRAMUSCULAR | Status: DC | PRN
  Administered 2019-11-03 (×2): 25 ug via INTRAVENOUS
  Administered 2019-11-03: 50 ug via INTRAVENOUS
  Administered 2019-11-03 (×2): 25 ug via INTRAVENOUS

## 2019-11-03 MED ORDER — PANTOPRAZOLE SODIUM 40 MG PO TBEC
40.0000 mg | DELAYED_RELEASE_TABLET | Freq: Every day | ORAL | Status: DC
  Administered 2019-11-04: 40 mg via ORAL
  Filled 2019-11-03: qty 1

## 2019-11-03 MED ORDER — METOPROLOL TARTRATE 5 MG/5ML IV SOLN: 2.0000 mg | INTRAVENOUS | Status: DC | PRN

## 2019-11-03 MED ORDER — APIXABAN 5 MG PO TABS
5.0000 mg | ORAL_TABLET | Freq: Two times a day (BID) | ORAL | Status: DC
Start: 2019-11-04 — End: 2019-11-04
  Administered 2019-11-04: 5 mg via ORAL
  Filled 2019-11-03: qty 1

## 2019-11-03 MED ORDER — BISACODYL 10 MG RE SUPP: 10.0000 mg | Freq: Every day | RECTAL | Status: DC | PRN

## 2019-11-03 MED ORDER — MIDAZOLAM HCL 2 MG/2ML IJ SOLN
INTRAMUSCULAR | Status: DC | PRN
  Administered 2019-11-03 (×2): 1 mg via INTRAVENOUS

## 2019-11-03 SURGICAL SUPPLY — 45 items
BLADE SAW RECIP 87.9 MT (BLADE) IMPLANT
BNDG COHESIVE 4X5 TAN STRL (GAUZE/BANDAGES/DRESSINGS) ×3 IMPLANT
BNDG COHESIVE 6X5 TAN STRL LF (GAUZE/BANDAGES/DRESSINGS) ×3 IMPLANT
BNDG ELASTIC 6X5.8 VLCR STR LF (GAUZE/BANDAGES/DRESSINGS) ×3 IMPLANT
BNDG GAUZE ELAST 4 BULKY (GAUZE/BANDAGES/DRESSINGS) ×3 IMPLANT
CANISTER SUCT 3000ML PPV (MISCELLANEOUS) ×3 IMPLANT
CANISTER WOUNDNEG PRESSURE 500 (CANNISTER) ×3 IMPLANT
CLIP VESOCCLUDE MED 6/CT (CLIP) IMPLANT
COVER SURGICAL LIGHT HANDLE (MISCELLANEOUS) ×3 IMPLANT
COVER WAND RF STERILE (DRAPES) IMPLANT
DRAIN CHANNEL 19F RND (DRAIN) IMPLANT
DRAPE HALF SHEET 40X57 (DRAPES) ×3 IMPLANT
DRAPE ORTHO SPLIT 77X108 STRL (DRAPES) ×6
DRAPE SURG ORHT 6 SPLT 77X108 (DRAPES) ×4 IMPLANT
DRSG ADAPTIC 3X8 NADH LF (GAUZE/BANDAGES/DRESSINGS) ×3 IMPLANT
DRSG VAC ATS SM SENSATRAC (GAUZE/BANDAGES/DRESSINGS) ×3 IMPLANT
ELECT CAUTERY BLADE 6.4 (BLADE) ×3 IMPLANT
ELECT REM PT RETURN 9FT ADLT (ELECTROSURGICAL) ×3
ELECTRODE REM PT RTRN 9FT ADLT (ELECTROSURGICAL) ×2 IMPLANT
EVACUATOR SILICONE 100CC (DRAIN) IMPLANT
GAUZE SPONGE 4X4 12PLY STRL (GAUZE/BANDAGES/DRESSINGS) ×3 IMPLANT
GLOVE BIO SURGEON STRL SZ7.5 (GLOVE) ×3 IMPLANT
GOWN STRL REUS W/ TWL LRG LVL3 (GOWN DISPOSABLE) ×6 IMPLANT
GOWN STRL REUS W/TWL LRG LVL3 (GOWN DISPOSABLE) ×9
KIT BASIN OR (CUSTOM PROCEDURE TRAY) ×3 IMPLANT
KIT REMOVER STAPLE SKIN (MISCELLANEOUS) ×3 IMPLANT
KIT TURNOVER KIT B (KITS) ×3 IMPLANT
NS IRRIG 1000ML POUR BTL (IV SOLUTION) ×3 IMPLANT
PACK GENERAL/GYN (CUSTOM PROCEDURE TRAY) ×3 IMPLANT
PAD ARMBOARD 7.5X6 YLW CONV (MISCELLANEOUS) ×6 IMPLANT
STAPLER VISISTAT 35W (STAPLE) ×6 IMPLANT
STOCKINETTE IMPERVIOUS LG (DRAPES) ×3 IMPLANT
SUT ETHILON 3 0 PS 1 (SUTURE) IMPLANT
SUT SILK 2 0 SH (SUTURE) IMPLANT
SUT SILK 2 0 SH CR/8 (SUTURE) ×3 IMPLANT
SUT SILK 2 0 TIES 10X30 (SUTURE) IMPLANT
SUT SILK 3 0 (SUTURE) ×3
SUT SILK 3-0 18XBRD TIE 12 (SUTURE) ×2 IMPLANT
SUT VIC AB 2-0 CT1 18 (SUTURE) ×3 IMPLANT
SUT VIC AB 2-0 SH 18 (SUTURE) IMPLANT
SUT VIC AB 3-0 SH 27 (SUTURE) ×3
SUT VIC AB 3-0 SH 27X BRD (SUTURE) ×2 IMPLANT
TOWEL GREEN STERILE (TOWEL DISPOSABLE) ×6 IMPLANT
UNDERPAD 30X36 HEAVY ABSORB (UNDERPADS AND DIAPERS) IMPLANT
WATER STERILE IRR 1000ML POUR (IV SOLUTION) ×3 IMPLANT

## 2019-11-03 NOTE — Anesthesia Procedure Notes (Signed)
Procedure Name: LMA Insertion Date/Time: 11/03/2019 12:35 PM Performed by: Nils Pyle, CRNA Pre-anesthesia Checklist: Patient identified, Emergency Drugs available, Suction available and Patient being monitored Patient Re-evaluated:Patient Re-evaluated prior to induction Oxygen Delivery Method: Circle System Utilized Preoxygenation: Pre-oxygenation with 100% oxygen Induction Type: IV induction LMA: LMA inserted LMA Size: 4.0 Number of attempts: 1 Airway Equipment and Method: Bite block Placement Confirmation: positive ETCO2 Tube secured with: Tape Dental Injury: Teeth and Oropharynx as per pre-operative assessment

## 2019-11-03 NOTE — Op Note (Addendum)
Procedure: Debridement left above-knee amputation placement of VAC dressing  Preoperative diagnosis: Poorly healing left above-knee amputation  Postoperative diagnosis: Same  Anesthesia: General  Assistant: Doreatha Massed, PA-C  Operative findings: Necrotic skin edges and superficial subcutaneous tissue not penetrating beneath the level of the fascia no abscess some serous drainage  Wound 5 x3 cm 2 cm depth  Operative details: After obtaining informed consent, patient taken the operating room.  The patient is placed supine position operating table.  After induction general anesthesia patient's entire left above-knee amputation stump was prepped and draped in usual sterile fashion.  There was a large amount of eschar along the skin edge and this was sharply debrided with a scalpel down to healthy appearing skin and subcutaneous tissue.  This was bleeding well.  The fascial sutures were still in place and no evidence of obvious deep abscess or infection.  All of this tissue appeared healthy and viable.  The wound was thoroughly irrigated with normal saline solution.  The skin edges were reapproximated on the lateral two thirds of the incision.  The medial one third of the incision had a VAC dressing placed over this and took to suction at 125 mm continuous pressure.  Patient tolerated procedure well and there were no complications.  Instrument sponge and needle counts correct in the case.  Patient was taken to recovery in stable condition.  Fabienne Bruns, MD [facility] [place] Office: [phone]

## 2019-11-03 NOTE — Transfer of Care (Signed)
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  Last Pain:  Vitals:   11/03/19 0902  TempSrc:   PainSc: 0-No pain         Complications: No apparent anesthesia complications

## 2019-11-03 NOTE — Discharge Planning (Signed)
RNCM consulted regarding home wound vac. Stasia Cavalier, RN assisted and arranged with Traci of [employer] to obtain authorization and delivery of [employer] home vac to pt at bedside in PACU prior to discharge home today.

## 2019-11-03 NOTE — Anesthesia Postprocedure Evaluation (Signed)
Anesthesia Post Note  Patient: Mercedes [name]  Procedure(s) Performed: Application Of Wound Vac, left lower extremity (Left Leg Upper) Debridement Wound of left above the knee amputation (Left Leg Upper)     Patient location during evaluation: PACU Anesthesia Type: General Level of consciousness: awake and alert Pain management: pain level controlled Vital Signs Assessment: post-procedure vital signs reviewed and stable Respiratory status: spontaneous breathing, nonlabored ventilation, respiratory function stable and patient connected to nasal cannula oxygen Cardiovascular status: blood pressure returned to baseline and stable Postop Assessment: no apparent nausea or vomiting Anesthetic complications: no    Last Vitals:  Vitals:   11/03/19 1315 11/03/19 1333  BP: (!) 183/85 (!) 160/77  Pulse:    Resp: 20 14  Temp: 36.5 C   SpO2:      Last Pain:  Vitals:   11/03/19 0902  TempSrc:   PainSc: 0-No pain                 Gricel Copen COKER

## 2019-11-03 NOTE — Discharge Instructions (Signed)
Resume Eliquis tomorrow, November 04, 2019.

## 2019-11-03 NOTE — Progress Notes (Signed)
[employer] wound vac mostlikely not delivered this evening. Stann Mainland PA will admit overnite, d/c in am. Spoke with patient and her sister, [name]. All aware, acknowledge understandfing.

## 2019-11-03 NOTE — Interval H&P Note (Signed)
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  The various methods of treatment have been discussed with the patient and family. After consideration of risks, benefits and other options for treatment, the patient has consented to  Procedure(s): Application Of Wound Vac, left lower extremity (Left) Debridement Wound of left above the knee amputation (Left) as a surgical intervention.  The patient's history has been reviewed, patient examined, no change in status, stable for surgery.  I have reviewed the patient's chart and labs.  Questions were answered to the patient's satisfaction.     Fabienne Bruns

## 2019-11-03 NOTE — TOC Transition Note (Signed)
Transition of Care [facility]) - CM/SW Discharge Note Donn Pierini RN, BSN Transitions of Care Unit 4E- RN Case Manager [phone]   Patient Details  Name: Mercedes [name] MRN: [id number] Date of Birth: [birth year]  Transition of Care [facility]) CM/SW Contact:  Darrold Span, RN Phone Number: 11/03/2019, 4:27 PM   Clinical Narrative:    Pt s/p I&D today, notified by Pleas Koch. With vascular that pt needed home wound VAC and HHRN for transition home from PACU today. [employer] home wound VAC form signed and faxed to [facility]- call made to [place] with [employer] regarding home wound VAC needs. Process started for insurance approval and if approved will see about getting home VAC delivered to PACU for pt's discharge today. Call made to [facility] with [facility] and have confirmed they can see pt for [facility] needs-  [phone]- spoke with French Ana with [employer] - home wound VAC approval still pending- could still be approved but may be late if today. Spoke with Pleas Koch. Let her know VAC is still pending- will plan to keep pt overnight and transition home once home [facility] approved and has been delivered- updated [place] with [employer].    Final next level of care: Home w Home Health Services Barriers to Discharge: Equipment Delay(awaiting [employer] home wound [facility] approval)   Patient Goals and CMS Choice Patient states their goals for this hospitalization and ongoing recovery are:: return home s/p I&D      Discharge Placement               Home with [facility]        Discharge Plan and Services   Discharge Planning Services: CM Consult Post Acute Care Choice: Home Health, Resumption of Svcs/PTA Provider          DME Arranged: Vac DME Agency: [employer] Date DME Agency Contacted: 11/03/19 Time DME Agency Contacted: 1440 Representative spoke with at DME Agency: French Ana HH Arranged: RN HH Agency: [facility] Date [facility] Agency Contacted: 11/03/19 Time HH Agency Contacted: 1500 Representative spoke with at [facility] Agency: Kandee Keen  Social  Determinants of Health (SDOH) Interventions     Readmission Risk Interventions Readmission Risk Prevention Plan 10/06/2019 05/16/2019  Transportation Screening Complete Complete  PCP or Specialist Appt within 5-7 Days Complete Complete  Home Care Screening Complete Complete  Medication Review (RN CM) Complete Complete  Some recent data might be hidden

## 2019-11-03 NOTE — Progress Notes (Signed)
Patient unable to provide urine sample for a UA. Dr. Evelina Dun notified.

## 2019-11-03 NOTE — Progress Notes (Signed)
Patient alert and stable. Resting comfortable in bed. Crackers and fluids tolerated. Nurse will continue to monitor.

## 2019-11-03 NOTE — Progress Notes (Signed)
PT received from PACU. Pt C/A//Ox4. Pt chg bath given. Vitals stable. Pt denies complaints. Pt oriented to room and call bell within reach. Will continue to monitor. Lacy Duverney, RN

## 2019-11-03 NOTE — Progress Notes (Signed)
Cm/ [employer] approval for [employer] wound vac remains pending... pt may have to be admitted overnite as  Vac may not be available unitl the [facility]. Stann Mainland PA/vasc service notified of same, awaiting tentative disposition.

## 2019-11-04 DIAGNOSIS — Z89612 Acquired absence of left leg above knee: Secondary | ICD-10-CM | POA: Diagnosis not present

## 2019-11-04 DIAGNOSIS — N182 Chronic kidney disease, stage 2 (mild): Secondary | ICD-10-CM | POA: Diagnosis not present

## 2019-11-04 DIAGNOSIS — I129 Hypertensive chronic kidney disease with stage 1 through stage 4 chronic kidney disease, or unspecified chronic kidney disease: Secondary | ICD-10-CM | POA: Diagnosis not present

## 2019-11-04 DIAGNOSIS — E1122 Type 2 diabetes mellitus with diabetic chronic kidney disease: Secondary | ICD-10-CM | POA: Diagnosis not present

## 2019-11-04 DIAGNOSIS — E785 Hyperlipidemia, unspecified: Secondary | ICD-10-CM | POA: Diagnosis not present

## 2019-11-04 DIAGNOSIS — T8189XA Other complications of procedures, not elsewhere classified, initial encounter: Secondary | ICD-10-CM | POA: Diagnosis not present

## 2019-11-04 DIAGNOSIS — I2510 Atherosclerotic heart disease of native coronary artery without angina pectoris: Secondary | ICD-10-CM | POA: Diagnosis not present

## 2019-11-04 DIAGNOSIS — I480 Paroxysmal atrial fibrillation: Secondary | ICD-10-CM | POA: Diagnosis not present

## 2019-11-04 DIAGNOSIS — Z8673 Personal history of transient ischemic attack (TIA), and cerebral infarction without residual deficits: Secondary | ICD-10-CM | POA: Diagnosis not present

## 2019-11-04 DIAGNOSIS — I252 Old myocardial infarction: Secondary | ICD-10-CM | POA: Diagnosis not present

## 2019-11-04 DIAGNOSIS — Z87891 Personal history of nicotine dependence: Secondary | ICD-10-CM | POA: Diagnosis not present

## 2019-11-04 DIAGNOSIS — Z79899 Other long term (current) drug therapy: Secondary | ICD-10-CM | POA: Diagnosis not present

## 2019-11-04 DIAGNOSIS — Z950 Presence of cardiac pacemaker: Secondary | ICD-10-CM | POA: Diagnosis not present

## 2019-11-04 DIAGNOSIS — E1151 Type 2 diabetes mellitus with diabetic peripheral angiopathy without gangrene: Secondary | ICD-10-CM | POA: Diagnosis not present

## 2019-11-04 DIAGNOSIS — Z951 Presence of aortocoronary bypass graft: Secondary | ICD-10-CM | POA: Diagnosis not present

## 2019-11-04 DIAGNOSIS — Z7901 Long term (current) use of anticoagulants: Secondary | ICD-10-CM | POA: Diagnosis not present

## 2019-11-04 NOTE — Plan of Care (Signed)
EDUCATED 

## 2019-11-04 NOTE — Progress Notes (Signed)
Pt home wound vac arrived. Vac attached to pt with 125 mm of continuous suction. Pt tolerated well. Pts questions answered. Pt states she would like to rest before calling her ride for discharge.

## 2019-11-04 NOTE — Progress Notes (Signed)
Pt provided discharge instructions and education. Pt vitals stable. Pt has all belongings including wound vac supplies. Pt tx via wheelchair to meet ride.  Lacy Duverney, RN

## 2019-11-04 NOTE — Discharge Summary (Addendum)
Discharge Summary    Mercedes [name] 01/17/1956 64 y.o. female  [id number]  Admission Date: 11/03/2019  Discharge Date: 11/04/2019  Physician: Sherren Kerns, MD  Admission Diagnosis: S/P above knee amputation, left [facility]) [Z89.612]   HPI:   This is a 64 y.o. female who presents for wound check.  She presented to the hospital with ischemic left lower extremity and emergently underwent thrombectomy with femoral to distal bypass by Dr. Arbie Cookey on 09/25/2019.  During same hospital admission she required left above-the-knee amputation due to extensive tissue loss.  After discharge she fell onto her AKA stump.  She was seen last week in the office.  At that time I removed staples from medial stump and began wet-to-dry packing as well as a prescription for Keflex.  She returns today with ongoing pain, inflammation, and necrotic skin edges to medial stump.  She is now agreeable to OR debridement.  She is on Eliquis for paroxysmal atrial fibrillation.  Hospital Course:  The patient was admitted to the hospital and taken to the operating room on 11/03/2019 and underwent: Debridement left above-knee amputation placement of VAC dressing    The pt tolerated the procedure well and was transported to the PACU in good condition.   Pt was to be discharged home on day of surgery, but wound vac approval was late so she was admitted for observation.    On POD 1, pt is doing well and will be discharged once vac is approved.    The remainder of the hospital course consisted of increasing mobilization and increasing intake of solids without difficulty.  CBC    Component Value Date/Time   WBC 5.0 11/03/2019 0840   RBC 5.19 (H) 11/03/2019 0840   HGB 12.4 11/03/2019 0840   HGB 14.2 10/09/2018 1125   HCT 41.5 11/03/2019 0840   HCT 44.6 10/09/2018 1125   PLT 422 (H) 11/03/2019 0840   PLT 289 10/09/2018 1125   MCV 80.0 11/03/2019 0840   MCV 79 10/09/2018 1125   MCH 23.9 (L) 11/03/2019 0840   MCHC  29.9 (L) 11/03/2019 0840   RDW 17.1 (H) 11/03/2019 0840   RDW 14.8 10/09/2018 1125   LYMPHSABS 2.5 05/22/2019 1816   MONOABS 0.6 05/22/2019 1816   EOSABS 0.4 05/22/2019 1816   BASOSABS 0.1 05/22/2019 1816    BMET    Component Value Date/Time   NA 136 11/03/2019 0840   NA 135 10/09/2018 1125   K 3.8 11/03/2019 0840   CL 103 11/03/2019 0840   CO2 24 11/03/2019 0840   GLUCOSE 108 (H) 11/03/2019 0840   BUN 10 11/03/2019 0840   BUN 10 10/09/2018 1125   CREATININE 0.87 11/03/2019 0840   CREATININE 1.13 (H) 08/03/2016 1440   CALCIUM 9.6 11/03/2019 0840   GFRNONAA >60 11/03/2019 0840   GFRAA >60 11/03/2019 0840      Discharge Instructions    Discharge patient   Complete by: As directed    Discharge home if [facility] RN and vac can be arranged.   Discharge disposition: 01-Home or Self Care   Discharge patient date: 11/03/2019   Discharge patient   Complete by: As directed    Discharge when [facility] needs are arranged.   Discharge disposition: 01-Home or Self Care   Discharge patient date: 11/04/2019      Discharge Diagnosis:  S/P above knee amputation, left [facility]) [W09.811]  Secondary Diagnosis: Patient Active Problem List   Diagnosis Date Noted  . S/P above knee amputation, left (HCC) 11/03/2019  .  Non-healing surgical wound 10/30/2019  . Stump pain (HCC) 10/21/2019  . S/P AKA (above knee amputation), left (HCC), 10/01/19 10/20/2019  . Femoral-popliteal bypass graft occlusion, left (HCC) 09/25/2019  . Peripheral arterial disease (HCC) 09/25/2019  . 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 Protein-calorie malnutrition, severe 05/15/2019  . Critical lower limb ischemia 05/09/2019  . Dyspnea 02/01/2019  . Weight loss, unintentional 02/01/2019  . Epigastric pain 02/01/2019  . 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 Acute renal failure superimposed on stage 2 chronic kidney disease (HCC) 10/20/2018  . Diabetes (HCC) 04/01/2017  . Hyperthyroidism 02/21/2017  .  Low mean corpuscular volume (MCV) 09/30/2016  . Coronary artery disease involving native heart without angina pectoris   . Hx of CABG 07/17/2016  . Paroxysmal atrial fibrillation (HCC) 07/12/2016  . NSTEMI (non-ST elevated myocardial infarction) (HCC) 07/11/2016  . History of arterial bypass of lower extremity 12/30/2015  . Claudication of right lower extremity (HCC) 12/30/2015  . Phantom limb pain-Left great toe 04/29/2015  . Seizure (HCC)   . Atrial flutter with rapid ventricular response vs. SVT 10/27/2014    Class: Acute  . Atherosclerosis of native artery of left lower extremity with rest pain (HCC) 12/18/2013  . PAD (peripheral artery disease) (HCC) 12/02/2013  . SLE (systemic lupus erythematosus) (HCC)   . CAD - S/P Takotsubo MI in 2000, CFX DES Sept 2014 05/25/2013  . Acute MI, inferior wall (HCC) 05/19/2013  . 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 Tachycardia-bradycardia (HCC) 03/09/2010  . PVD- s/p Lt [facility] April 2015 10/29/2009  . Dyslipidemia 08/26/2009  . Essential hypertension 08/26/2009  . CORONARY ATHEROSCLEROSIS NATIVE CORONARY ARTERY 08/26/2009  . 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 Rheumatoid arthritis (HCC) 08/26/2009   Past Medical History:  Diagnosis Date  . Abnormal LFTs    a. in the past when on Lipitor  . Anemia    as a young woman  . Anxiety   . Arnold-Chiari malformation, type I (HCC)    MRI brain 02/2010  . CAD (coronary artery disease)    a. NSTEMI 07/2009: LHC - D2 40%, LAD irreg., EF 50% with apical AK (tako-tsubo CM);  b. Inf STEMI (04/2013): Tx Promus DES to CFX;  c. 08/2013 Lexi CL: No ischemia, dist ant/ap/inf-ap infarct, EF  d. 06/2016 NSTEMI with LM disease: s/p CABG on 07/17/2016 w/ LIMA-LAD, SVG-OM, and SVG-dRCA.  [name] DEPRESSION   . Dizziness    ? CVA 01/2010 - carotid dopplers with no ICA stenosis  . GERD (gastroesophageal reflux disease)   . Headache   . HYPERLIPIDEMIA   . HYPERTENSION   . MI (myocardial infarction) (HCC)   . PAD  (peripheral artery disease) (HCC)    s/p L fem pop 11/2013 in setting of 1st toe osteo/gangrene  . Paroxysmal atrial fibrillation (HCC)    a. anticoagulated with Eliquis 10/2014  . 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 RA (rheumatoid arthritis) (HCC)    prior tx by Dr. Dareen Piano  . Sjogren's syndrome (HCC) 06/02/13   pt denies this (12/01/13)  . SLE (systemic lupus erythematosus) (HCC) dx 05/2013   follows with rheum Dareen Piano)  . Tachy-brady syndrome (HCC)    a. s/p STJ Leadless pacemaker 11-04-2014 Dr Johney Frame  . 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 Wears dentures   . Wears glasses      Allergies as of 11/04/2019  Reactions   Brilinta [ticagrelor] Anaphylaxis, Other (See Comments)   Also, chest tightness   Latex Rash   Tape Rash      Medication List    TAKE these medications   acetaminophen 500 MG tablet Commonly known as: TYLENOL Take 500-1,000 mg by mouth every 8 (eight) hours as needed for mild pain or headache.   cephALEXin 500 MG capsule Commonly known as: KEFLEX Take 1 capsule (500 mg total) by mouth 2 (two) times daily.   DULoxetine 30 MG capsule Commonly known as: CYMBALTA Take 1 capsule (30 mg total) by mouth daily. Qam What changed: additional instructions   Eliquis 5 MG Tabs tablet Generic drug: apixaban TAKE 1 TABLET BY MOUTH TWICE DAILY What changed: how much to take   lisinopril 10 MG tablet Commonly known as: ZESTRIL Take 1 tablet (10 mg total) by mouth daily.   metoprolol tartrate 25 MG tablet Commonly known as: LOPRESSOR Take 0.5 tablets (12.5 mg total) by mouth 2 (two) times daily.   multivitamin with minerals Tabs tablet Take 1 tablet by mouth daily. What changed: when to take this   nitroGLYCERIN 0.4 MG SL tablet Commonly known as: NITROSTAT PLACE AND DISSOLVE 1 TABLET UNDER THE TONGUE EVERY 5 MINUTES AS NEEDED FOR CHEST  PAIN What changed: See the new instructions.   oxyCODONE 5 MG immediate release tablet Commonly known as: Oxy IR/ROXICODONE Take 1 tablet (5 mg total) by mouth every 6 (six) hours as needed for severe pain.   rosuvastatin 40 MG tablet Commonly known as: CRESTOR Take 1 tablet (40 mg total) by mouth daily.            Durable Medical Equipment  (From admission, onward)         Start     Ordered   11/03/19 1311  For home use only DME Negative pressure wound device  Once    Comments: Will need until wiound heals. Possibly 6-12 weeks.  Question Answer Comment  Frequency of dressing change 3 times per week   Length of need Other see comments   Dressing type Foam   Amount of suction 120 mm/Hg   Pressure application Continuous pressure   Supplies 10 canisters and 15 dressings per month for duration of therapy      11/03/19 1311          Prescriptions given: Roxicet #20 No Refill Keflex 500mg  bid #20 no refill  Instructions: 1.  Wound vac change M/W/F with HHRN  Disposition: home with [facility]  Patient's condition: is Good  Follow up: 1. Dr. Oneida Alar in 2 weeks   Leontine Locket, PA-C [facility] [phone] 11/04/2019  7:28 AM

## 2019-11-04 NOTE — TOC Transition Note (Signed)
Transition of Care [facility]) - CM/SW Discharge Note Donn Pierini RN, BSN Transitions of Care Unit 4E- RN Case Manager [phone]   Patient Details  Name: Mercedes [name] MRN: [id number] Date of Birth: Jul 23, 1956  Transition of Care [facility]) CM/SW Contact:  Darrold Span, RN Phone Number: 11/04/2019, 12:48 PM   Clinical Narrative:    Confirmed with French Ana at [facility] home wound VAC has been approved and is in route to the hospital for delivery. Once at the bedside- bedside RN can change pt over to home [name] and transition home. Bedside RN aware. HH [place]-  already aware of needs and plan for home visit tomorrow for wound VAC drsg change.    Final next level of care: Home w Home Health Services Barriers to Discharge: Equipment Delay(awaiting [employer] home wound [facility] approval)   Patient Goals and CMS Choice Patient states their goals for this hospitalization and ongoing recovery are:: return home s/p I&D      Discharge Placement                 home with [facility]      Discharge Plan and Services   Discharge Planning Services: CM Consult Post Acute Care Choice: Home Health, Resumption of Svcs/PTA Provider          DME Arranged: Vac DME Agency: [employer] Date DME Agency Contacted: 11/03/19 Time DME Agency Contacted: 1440 Representative spoke with at DME Agency: French Ana HH Arranged: RN HH Agency: [facility] Date [facility] Agency Contacted: 11/03/19 Time HH Agency Contacted: 1500 Representative spoke with at [facility] Agency: Kandee Keen  Social Determinants of Health (SDOH) Interventions     Readmission Risk Interventions Readmission Risk Prevention Plan 10/06/2019 05/16/2019  Transportation Screening Complete Complete  PCP or Specialist Appt within 5-7 Days Complete Complete  Home Care Screening Complete Complete  Medication Review (RN CM) Complete Complete  Some recent data might be hidden

## 2019-11-04 NOTE — Progress Notes (Addendum)
  Progress Note    11/04/2019 7:20 AM 1 Day Post-Op  Subjective:  No complaints; wants to know if this is going to hinder her prosthesis  Afebrile HR 60's-70's  120's-150's systolic 98% RA  Vitals:   11/03/19 2127 11/04/19 0712  BP: 129/72 135/77  Pulse: 64 69  Resp: 19 18  Temp: 98.5 F (36.9 C) 98.9 F (37.2 C)  SpO2: 99% 98%    Physical Exam: General:  No distress Lungs:  Non labored Incisions:  Wound vac with good seal; staples in place laterally.   CBC    Component Value Date/Time   WBC 5.0 11/03/2019 0840   RBC 5.19 (H) 11/03/2019 0840   HGB 12.4 11/03/2019 0840   HGB 14.2 10/09/2018 1125   HCT 41.5 11/03/2019 0840   HCT 44.6 10/09/2018 1125   PLT 422 (H) 11/03/2019 0840   PLT 289 10/09/2018 1125   MCV 80.0 11/03/2019 0840   MCV 79 10/09/2018 1125   MCH 23.9 (L) 11/03/2019 0840   MCHC 29.9 (L) 11/03/2019 0840   RDW 17.1 (H) 11/03/2019 0840   RDW 14.8 10/09/2018 1125   LYMPHSABS 2.5 05/22/2019 1816   MONOABS 0.6 05/22/2019 1816   EOSABS 0.4 05/22/2019 1816   BASOSABS 0.1 05/22/2019 1816    BMET    Component Value Date/Time   NA 136 11/03/2019 0840   NA 135 10/09/2018 1125   K 3.8 11/03/2019 0840   CL 103 11/03/2019 0840   CO2 24 11/03/2019 0840   GLUCOSE 108 (H) 11/03/2019 0840   BUN 10 11/03/2019 0840   BUN 10 10/09/2018 1125   CREATININE 0.87 11/03/2019 0840   CREATININE 1.13 (H) 08/03/2016 1440   CALCIUM 9.6 11/03/2019 0840   GFRNONAA >60 11/03/2019 0840   GFRAA >60 11/03/2019 0840    INR    Component Value Date/Time   INR 1.0 11/03/2019 0840     Intake/Output Summary (Last 24 hours) at 11/04/2019 0720 Last data filed at 11/04/2019 0200 Gross per 24 hour  Intake 450 ml  Output 280 ml  Net 170 ml     Assessment:  64 y.o. female is s/p:  Debridement left above-knee amputation placement of VAC dressing  1 Day Post-Op  Plan: -pt's vac with good seal.  Awaiting approval of home vac.  Home this am when approved. F/u with Dr.  Darrick Penna in a couple of weeks.     Doreatha Massed, PA-C [facility] [phone] 11/04/2019 7:20 AM  Agree with above.  Home VAC and then d/c   Fabienne Bruns, MD [facility] [place] Office: [phone]

## 2019-11-05 ENCOUNTER — Ambulatory Visit (HOSPITAL_COMMUNITY)
Admission: RE | Admit: 2019-11-05 | Payer: Medicare Other | Source: Ambulatory Visit | Attending: Cardiology | Admitting: Cardiology

## 2019-11-05 ENCOUNTER — Telehealth: Payer: Self-pay | Admitting: *Deleted

## 2019-11-05 NOTE — Telephone Encounter (Signed)
Pt was on TCM report admitted 11/03/19 to the hospital and underwent: Debridement left above-knee amputation placement of VAC dressing. Pt tolerated the procedure well and was transported to the PACU in good condition. Pt D/C 11/04/19, and will follow-up w/Fields, Janetta Hora, MD in 2 weeks.Marland KitchenRaechel Chute

## 2019-11-07 DIAGNOSIS — T82898D Other specified complication of vascular prosthetic devices, implants and grafts, subsequent encounter: Secondary | ICD-10-CM | POA: Diagnosis not present

## 2019-11-07 DIAGNOSIS — I70222 Atherosclerosis of native arteries of extremities with rest pain, left leg: Secondary | ICD-10-CM | POA: Diagnosis not present

## 2019-11-07 DIAGNOSIS — I2510 Atherosclerotic heart disease of native coronary artery without angina pectoris: Secondary | ICD-10-CM | POA: Diagnosis not present

## 2019-11-07 DIAGNOSIS — I10 Essential (primary) hypertension: Secondary | ICD-10-CM | POA: Diagnosis not present

## 2019-11-07 DIAGNOSIS — I252 Old myocardial infarction: Secondary | ICD-10-CM | POA: Diagnosis not present

## 2019-11-10 ENCOUNTER — Telehealth: Payer: Self-pay

## 2019-11-10 DIAGNOSIS — I10 Essential (primary) hypertension: Secondary | ICD-10-CM | POA: Diagnosis not present

## 2019-11-10 DIAGNOSIS — I70222 Atherosclerosis of native arteries of extremities with rest pain, left leg: Secondary | ICD-10-CM | POA: Diagnosis not present

## 2019-11-10 DIAGNOSIS — I2510 Atherosclerotic heart disease of native coronary artery without angina pectoris: Secondary | ICD-10-CM | POA: Diagnosis not present

## 2019-11-10 DIAGNOSIS — I252 Old myocardial infarction: Secondary | ICD-10-CM | POA: Diagnosis not present

## 2019-11-10 DIAGNOSIS — T82898D Other specified complication of vascular prosthetic devices, implants and grafts, subsequent encounter: Secondary | ICD-10-CM | POA: Diagnosis not present

## 2019-11-10 NOTE — Telephone Encounter (Signed)
Pt called, upset that Mercedes [name] has not come by her home to change her Wound VAC.    I called and spoke to the Rep who assured me that someone from the RN/LPN Team will be coming by today around 4:30pm.  Patient is scheduled for VAC changes M, W, F and is supposed to get a phone call to schedule a time the night before.    Patient is aware. And will contact the office if she has any more problems.  Ernst Spell., LPN

## 2019-11-12 DIAGNOSIS — I10 Essential (primary) hypertension: Secondary | ICD-10-CM | POA: Diagnosis not present

## 2019-11-12 DIAGNOSIS — I2510 Atherosclerotic heart disease of native coronary artery without angina pectoris: Secondary | ICD-10-CM | POA: Diagnosis not present

## 2019-11-12 DIAGNOSIS — I70222 Atherosclerosis of native arteries of extremities with rest pain, left leg: Secondary | ICD-10-CM | POA: Diagnosis not present

## 2019-11-12 DIAGNOSIS — T82898D Other specified complication of vascular prosthetic devices, implants and grafts, subsequent encounter: Secondary | ICD-10-CM | POA: Diagnosis not present

## 2019-11-12 DIAGNOSIS — I252 Old myocardial infarction: Secondary | ICD-10-CM | POA: Diagnosis not present

## 2019-11-14 DIAGNOSIS — I2510 Atherosclerotic heart disease of native coronary artery without angina pectoris: Secondary | ICD-10-CM | POA: Diagnosis not present

## 2019-11-14 DIAGNOSIS — T82898D Other specified complication of vascular prosthetic devices, implants and grafts, subsequent encounter: Secondary | ICD-10-CM | POA: Diagnosis not present

## 2019-11-14 DIAGNOSIS — I10 Essential (primary) hypertension: Secondary | ICD-10-CM | POA: Diagnosis not present

## 2019-11-14 DIAGNOSIS — I252 Old myocardial infarction: Secondary | ICD-10-CM | POA: Diagnosis not present

## 2019-11-14 DIAGNOSIS — I70222 Atherosclerosis of native arteries of extremities with rest pain, left leg: Secondary | ICD-10-CM | POA: Diagnosis not present

## 2019-11-17 ENCOUNTER — Telehealth: Payer: Self-pay

## 2019-11-17 DIAGNOSIS — T82898D Other specified complication of vascular prosthetic devices, implants and grafts, subsequent encounter: Secondary | ICD-10-CM | POA: Diagnosis not present

## 2019-11-17 DIAGNOSIS — I252 Old myocardial infarction: Secondary | ICD-10-CM | POA: Diagnosis not present

## 2019-11-17 DIAGNOSIS — I2510 Atherosclerotic heart disease of native coronary artery without angina pectoris: Secondary | ICD-10-CM | POA: Diagnosis not present

## 2019-11-17 DIAGNOSIS — I10 Essential (primary) hypertension: Secondary | ICD-10-CM | POA: Diagnosis not present

## 2019-11-17 DIAGNOSIS — I70222 Atherosclerosis of native arteries of extremities with rest pain, left leg: Secondary | ICD-10-CM | POA: Diagnosis not present

## 2019-11-17 NOTE — Telephone Encounter (Addendum)
Message relayed to [place].  She says patient has already taken off the [facility] and is refusing to put it back on.  The earliest appt we have to work her in is Tues Mar. 23rd.  Joni Reining will have patient call to make appt.  Ernst Spell., LPN  ----- Message from Sherren Kerns, MD sent at 11/14/2019  3:57 PM EDT ----- Regarding: RE: Wound Check Contact: [phone] Move up appt can be seen in PA clinic Monday.   ----- Message ----- From: Yolonda Kida, LPN Sent: 09/15/4172   3:00 PM EDT To: Sherren Kerns, MD Subject: Wound Check                                    Hey Dr. Darrick Penna.  Joni Reining from [place] called.  Patient has very little drainage from the wound vac.  Pt is refusing to leave the Vac on the whole time. She will turn it off,leave the foam on and puts a dry bandage over it.  Wound is full of slough and bloody drainage.  Pt is supposed to get her Vac changed 3 times a week.  She has an appt with you on 03/25.   Please advise.  Thank you,   Ernst Spell., LPN

## 2019-11-18 ENCOUNTER — Ambulatory Visit (INDEPENDENT_AMBULATORY_CARE_PROVIDER_SITE_OTHER): Payer: Medicare Other | Admitting: Physician Assistant

## 2019-11-18 ENCOUNTER — Other Ambulatory Visit: Payer: Self-pay

## 2019-11-18 ENCOUNTER — Inpatient Hospital Stay (HOSPITAL_COMMUNITY)
Admission: AD | Admit: 2019-11-18 | Discharge: 2019-11-24 | DRG: 948 | Disposition: A | Discharge status: Home Health | Payer: Medicare Other | Source: Ambulatory Visit | Attending: Vascular Surgery | Admitting: Vascular Surgery

## 2019-11-18 VITALS — BP 141/79 | HR 85 | Temp 98.2°F | Resp 16

## 2019-11-18 DIAGNOSIS — Z89612 Acquired absence of left leg above knee: Secondary | ICD-10-CM

## 2019-11-18 DIAGNOSIS — I495 Sick sinus syndrome: Secondary | ICD-10-CM | POA: Diagnosis not present

## 2019-11-18 DIAGNOSIS — Z833 Family history of diabetes mellitus: Secondary | ICD-10-CM | POA: Diagnosis not present

## 2019-11-18 DIAGNOSIS — I252 Old myocardial infarction: Secondary | ICD-10-CM

## 2019-11-18 DIAGNOSIS — Z889 Allergy status to unspecified drugs, medicaments and biological substances status: Secondary | ICD-10-CM | POA: Diagnosis not present

## 2019-11-18 DIAGNOSIS — M329 Systemic lupus erythematosus, unspecified: Secondary | ICD-10-CM | POA: Diagnosis not present

## 2019-11-18 DIAGNOSIS — I480 Paroxysmal atrial fibrillation: Secondary | ICD-10-CM | POA: Diagnosis present

## 2019-11-18 DIAGNOSIS — I10 Essential (primary) hypertension: Secondary | ICD-10-CM | POA: Diagnosis present

## 2019-11-18 DIAGNOSIS — K219 Gastro-esophageal reflux disease without esophagitis: Secondary | ICD-10-CM | POA: Diagnosis present

## 2019-11-18 DIAGNOSIS — Z8249 Family history of ischemic heart disease and other diseases of the circulatory system: Secondary | ICD-10-CM

## 2019-11-18 DIAGNOSIS — Z8349 Family history of other endocrine, nutritional and metabolic diseases: Secondary | ICD-10-CM

## 2019-11-18 DIAGNOSIS — I739 Peripheral vascular disease, unspecified: Secondary | ICD-10-CM | POA: Diagnosis not present

## 2019-11-18 DIAGNOSIS — Z7901 Long term (current) use of anticoagulants: Secondary | ICD-10-CM

## 2019-11-18 DIAGNOSIS — Z91040 Latex allergy status: Secondary | ICD-10-CM | POA: Diagnosis not present

## 2019-11-18 DIAGNOSIS — Z950 Presence of cardiac pacemaker: Secondary | ICD-10-CM | POA: Diagnosis not present

## 2019-11-18 DIAGNOSIS — Z888 Allergy status to other drugs, medicaments and biological substances status: Secondary | ICD-10-CM | POA: Diagnosis not present

## 2019-11-18 DIAGNOSIS — T8189XS Other complications of procedures, not elsewhere classified, sequela: Secondary | ICD-10-CM | POA: Diagnosis not present

## 2019-11-18 DIAGNOSIS — E785 Hyperlipidemia, unspecified: Secondary | ICD-10-CM | POA: Diagnosis not present

## 2019-11-18 DIAGNOSIS — M79609 Pain in unspecified limb: Secondary | ICD-10-CM

## 2019-11-18 DIAGNOSIS — G8918 Other acute postprocedural pain: Secondary | ICD-10-CM | POA: Diagnosis not present

## 2019-11-18 DIAGNOSIS — Z87891 Personal history of nicotine dependence: Secondary | ICD-10-CM

## 2019-11-18 DIAGNOSIS — M069 Rheumatoid arthritis, unspecified: Secondary | ICD-10-CM | POA: Diagnosis present

## 2019-11-18 DIAGNOSIS — I2510 Atherosclerotic heart disease of native coronary artery without angina pectoris: Secondary | ICD-10-CM | POA: Diagnosis not present

## 2019-11-18 DIAGNOSIS — T8781 Dehiscence of amputation stump: Secondary | ICD-10-CM | POA: Diagnosis present

## 2019-11-18 DIAGNOSIS — Z8261 Family history of arthritis: Secondary | ICD-10-CM | POA: Diagnosis not present

## 2019-11-18 DIAGNOSIS — T8789 Other complications of amputation stump: Secondary | ICD-10-CM

## 2019-11-18 DIAGNOSIS — T8189XD Other complications of procedures, not elsewhere classified, subsequent encounter: Secondary | ICD-10-CM

## 2019-11-18 LAB — COMPREHENSIVE METABOLIC PANEL
ALT: 9 U/L (ref 0–44)
AST: 16 U/L (ref 15–41)
Albumin: 3 g/dL — ABNORMAL LOW (ref 3.5–5.0)
Alkaline Phosphatase: 76 U/L (ref 38–126)
Anion gap: 10 (ref 5–15)
BUN: 9 mg/dL (ref 8–23)
CO2: 24 mmol/L (ref 22–32)
Calcium: 9.2 mg/dL (ref 8.9–10.3)
Chloride: 98 mmol/L (ref 98–111)
Creatinine, Ser: 0.86 mg/dL (ref 0.44–1.00)
GFR calc Af Amer: >60 mL/min (ref >60)
GFR calc non Af Amer: >60 mL/min (ref >60)
Glucose, Bld: 105 mg/dL — ABNORMAL HIGH (ref 70–99)
Potassium: 3.4 mmol/L — ABNORMAL LOW (ref 3.5–5.1)
Sodium: 132 mmol/L — ABNORMAL LOW (ref 135–145)
Total Bilirubin: 0.5 mg/dL (ref 0.3–1.2)
Total Protein: 7.4 g/dL (ref 6.5–8.1)

## 2019-11-18 LAB — CBC
HCT: 35.7 % — ABNORMAL LOW (ref 36.0–46.0)
Hemoglobin: 10.8 g/dL — ABNORMAL LOW (ref 12.0–15.0)
MCH: 23.5 pg — ABNORMAL LOW (ref 26.0–34.0)
MCHC: 30.3 g/dL (ref 30.0–36.0)
MCV: 77.8 fL — ABNORMAL LOW (ref 80.0–100.0)
Platelets: 459 10*3/uL — ABNORMAL HIGH (ref 150–400)
RBC: 4.59 MIL/uL (ref 3.87–5.11)
RDW: 16.5 % — ABNORMAL HIGH (ref 11.5–15.5)
WBC: 6.3 10*3/uL (ref 4.0–10.5)
nRBC: 0 % (ref 0.0–0.2)

## 2019-11-18 LAB — PROTIME-INR
INR: 1 (ref 0.8–1.2)
Prothrombin Time: 13.5 seconds (ref 11.4–15.2)

## 2019-11-18 MED ORDER — HEPARIN SODIUM (PORCINE) 5000 UNIT/ML IJ SOLN: 5000.0000 [IU] | Freq: Three times a day (TID) | INTRAMUSCULAR | Status: DC

## 2019-11-18 MED ORDER — VANCOMYCIN HCL 750 MG/150ML IV SOLN
750.0000 mg | Freq: Once | INTRAVENOUS | Status: DC
  Filled 2019-11-18: qty 150

## 2019-11-18 MED ORDER — HYDRALAZINE HCL 20 MG/ML IJ SOLN: 5.0000 mg | INTRAMUSCULAR | Status: DC | PRN

## 2019-11-18 MED ORDER — NITROGLYCERIN 0.4 MG SL SUBL: 0.4000 mg | SUBLINGUAL_TABLET | SUBLINGUAL | Status: DC | PRN

## 2019-11-18 MED ORDER — PANTOPRAZOLE SODIUM 40 MG PO TBEC
40.0000 mg | DELAYED_RELEASE_TABLET | Freq: Every day | ORAL | Status: DC
  Administered 2019-11-18 – 2019-11-24 (×7): 40 mg via ORAL
  Filled 2019-11-18 (×7): qty 1

## 2019-11-18 MED ORDER — GUAIFENESIN-DM 100-10 MG/5ML PO SYRP: 15.0000 mL | ORAL_SOLUTION | ORAL | Status: DC | PRN

## 2019-11-18 MED ORDER — HYDROMORPHONE 1 MG/ML IV SOLN
INTRAVENOUS | Status: DC
  Administered 2019-11-19: 0.2 mg via INTRAVENOUS
  Administered 2019-11-19: 0 mg via INTRAVENOUS
  Administered 2019-11-19: 0.2 mg via INTRAVENOUS
  Administered 2019-11-19 – 2019-11-20 (×3): 0.4 mg via INTRAVENOUS
  Filled 2019-11-18: qty 30

## 2019-11-18 MED ORDER — SODIUM CHLORIDE 0.9 % IV SOLN: INTRAVENOUS | Status: DC

## 2019-11-18 MED ORDER — ROSUVASTATIN CALCIUM 20 MG PO TABS
40.0000 mg | ORAL_TABLET | Freq: Every day | ORAL | Status: DC
  Administered 2019-11-18 – 2019-11-23 (×6): 40 mg via ORAL
  Filled 2019-11-18 (×6): qty 2

## 2019-11-18 MED ORDER — SODIUM CHLORIDE 0.9% FLUSH: 9.0000 mL | INTRAVENOUS | Status: DC | PRN

## 2019-11-18 MED ORDER — ONDANSETRON HCL 4 MG/2ML IJ SOLN: 4.0000 mg | Freq: Four times a day (QID) | INTRAMUSCULAR | Status: DC | PRN

## 2019-11-18 MED ORDER — DIPHENHYDRAMINE HCL 12.5 MG/5ML PO ELIX
12.5000 mg | ORAL_SOLUTION | Freq: Four times a day (QID) | ORAL | Status: DC | PRN
  Filled 2019-11-18: qty 5

## 2019-11-18 MED ORDER — LABETALOL HCL 5 MG/ML IV SOLN: 10.0000 mg | INTRAVENOUS | Status: DC | PRN

## 2019-11-18 MED ORDER — ACETAMINOPHEN 500 MG PO TABS
500.0000 mg | ORAL_TABLET | Freq: Three times a day (TID) | ORAL | Status: DC | PRN
  Administered 2019-11-20 – 2019-11-23 (×7): 1000 mg via ORAL
  Filled 2019-11-18 (×7): qty 2

## 2019-11-18 MED ORDER — METOPROLOL TARTRATE 12.5 MG HALF TABLET
12.5000 mg | ORAL_TABLET | Freq: Two times a day (BID) | ORAL | Status: DC
  Administered 2019-11-18 – 2019-11-21 (×5): 12.5 mg via ORAL
  Filled 2019-11-18 (×6): qty 1

## 2019-11-18 MED ORDER — DULOXETINE HCL 30 MG PO CPEP
30.0000 mg | ORAL_CAPSULE | Freq: Every day | ORAL | Status: DC
  Administered 2019-11-18 – 2019-11-24 (×7): 30 mg via ORAL
  Filled 2019-11-18 (×7): qty 1

## 2019-11-18 MED ORDER — DIPHENHYDRAMINE HCL 50 MG/ML IJ SOLN: 12.5000 mg | Freq: Four times a day (QID) | INTRAMUSCULAR | Status: DC | PRN

## 2019-11-18 MED ORDER — NALOXONE HCL 0.4 MG/ML IJ SOLN: 0.4000 mg | INTRAMUSCULAR | Status: DC | PRN

## 2019-11-18 MED ORDER — VANCOMYCIN HCL IN DEXTROSE 1-5 GM/200ML-% IV SOLN
1000.0000 mg | INTRAVENOUS | Status: DC
  Administered 2019-11-19 – 2019-11-20 (×2): 1000 mg via INTRAVENOUS
  Filled 2019-11-18 (×3): qty 200

## 2019-11-18 MED ORDER — OXYCODONE HCL 5 MG PO TABS
5.0000 mg | ORAL_TABLET | Freq: Four times a day (QID) | ORAL | Status: DC | PRN
  Administered 2019-11-18 – 2019-11-22 (×7): 5 mg via ORAL
  Filled 2019-11-18 (×7): qty 1

## 2019-11-18 MED ORDER — PHENOL 1.4 % MT LIQD: 1.0000 | OROMUCOSAL | Status: DC | PRN

## 2019-11-18 MED ORDER — POTASSIUM CHLORIDE CRYS ER 20 MEQ PO TBCR
20.0000 meq | EXTENDED_RELEASE_TABLET | Freq: Once | ORAL | Status: AC
  Administered 2019-11-18: 20 meq via ORAL
  Filled 2019-11-18: qty 1

## 2019-11-18 MED ORDER — METOPROLOL TARTRATE 5 MG/5ML IV SOLN: 2.0000 mg | INTRAVENOUS | Status: DC | PRN

## 2019-11-18 MED ORDER — ALUM & MAG HYDROXIDE-SIMETH 200-200-20 MG/5ML PO SUSP: 15.0000 mL | ORAL | Status: DC | PRN

## 2019-11-18 MED ORDER — HEPARIN (PORCINE) 25000 UT/250ML-% IV SOLN: 600.0000 [IU]/h | INTRAVENOUS | Status: DC

## 2019-11-18 MED ORDER — LISINOPRIL 10 MG PO TABS: 10.0000 mg | ORAL_TABLET | Freq: Every day | ORAL | Status: DC

## 2019-11-18 MED ORDER — PIPERACILLIN-TAZOBACTAM 3.375 G IVPB
3.3750 g | Freq: Three times a day (TID) | INTRAVENOUS | Status: DC
  Administered 2019-11-18 – 2019-11-21 (×9): 3.375 g via INTRAVENOUS
  Filled 2019-11-18 (×9): qty 50

## 2019-11-18 NOTE — Progress Notes (Signed)
Called to start PIV for heparin drip.  Patient refusing IV start.  Explained need for second IV for heparin as medication not compatible with her other IV medication.  Explained that patient can't receive her heparin drip without second PIV.  Patient expressed understanding and still refused IV stating just wants to rest tonight.

## 2019-11-18 NOTE — Progress Notes (Signed)
Pharmacy Antibiotic Note  Mercedes [name] is a 64 y.o. female admitted on 11/18/2019 with severe pain from L AKA stump.  Pharmacy has been consulted for Zosyn and vancomcyin dosing for wound infection.  WBC 6.3, afebrile; Scr 0.86, CrCl 44.5 ml/min (renal function stable from recent admissions)  Plan: Zosyn 3.375 gm IV Q 8 hrs (extended infusion) Vancomycin 1 gm IV Q 36 hrs (estimated vancomycin AUC on this regimen, using Scr 0.86, is 518.8; goal vancomycin AUC is 400-550) Monitor WBC, temp, clinical improvement, cultures, renal function, vancomycin levels as indicated  Height: 5\' 6"  (167.6 cm) Weight: 95 lb 4.8 oz (43.2 kg) IBW/kg (Calculated) : 59.3  Temp (24hrs), Avg:98.9 F (37.2 C), Min:98.2 F (36.8 C), Max:99.5 F (37.5 C)  No results for input(s): WBC, CREATININE, LATICACIDVEN, VANCOTROUGH, VANCOPEAK, VANCORANDOM, GENTTROUGH, GENTPEAK, GENTRANDOM, TOBRATROUGH, TOBRAPEAK, TOBRARND, AMIKACINPEAK, AMIKACINTROU, AMIKACIN in the last 168 hours.  Estimated Creatinine Clearance: 44 mL/min (by C-G formula based on SCr of 0.87 mg/dL).    Allergies  Allergen Reactions  . Brilinta [Ticagrelor] Anaphylaxis and Other (See Comments)    Also, chest tightness  . Latex Rash  . Tape Rash    Microbiology results: Per provider note, plan wound cultures (not ordered yet)  Thank you for allowing pharmacy to be a part of this patient's care.  , PharmD, BCPS, [facility] Clinical Pharmacist 11/18/2019 6:20 PM

## 2019-11-18 NOTE — Progress Notes (Signed)
ANTICOAGULATION CONSULT NOTE - Initial Consult  Pharmacy Consult for Heparin Indication: atrial fibrillation  Allergies  Allergen Reactions  . Brilinta [Ticagrelor] Anaphylaxis and Other (See Comments)    Also, chest tightness  . Latex Rash  . Tape Rash    Patient Measurements: Total Body Weight: 43.2 kg Height: 66 inches Heparin Dosing Weight: 43.2 kg  Vital Signs: Temp: 98.2 F (36.8 C) (03/23 1421) Temp Source: Oral (03/23 1421) BP: 141/79 (03/23 1421) Pulse Rate: 85 (03/23 1421)  Labs: No results for input(s): HGB, HCT, PLT, APTT, LABPROT, INR, HEPARINUNFRC, HEPRLOWMOCWT, CREATININE, CKTOTAL, CKMB, TROPONINIHS in the last 72 hours.  CrCl cannot be calculated (Unknown ideal weight.).   Medical History: Past Medical History:  Diagnosis Date  . Abnormal LFTs    a. in the past when on Lipitor  . Anemia    as a young woman  . Anxiety   . Arnold-Chiari malformation, type I ([place])    MRI brain 02/2010  . CAD (coronary artery disease)    a. NSTEMI 07/2009: LHC - D2 40%, LAD irreg., EF 50% with apical AK (tako-tsubo CM);  b. Inf STEMI (04/2013): Tx Promus DES to CFX;  c. 08/2013 Lexi CL: No ischemia, dist ant/ap/inf-ap infarct, EF  d. 06/2016 NSTEMI with LM disease: s/p CABG on 07/17/2016 w/ LIMA-LAD, SVG-OM, and SVG-dRCA.  [name] DEPRESSION   . Dizziness    ? CVA 01/2010 - carotid dopplers with no ICA stenosis  . GERD (gastroesophageal reflux disease)   . Headache   . HYPERLIPIDEMIA   . HYPERTENSION   . MI (myocardial infarction) ([place])   . PAD (peripheral artery disease) ([place])    s/p L fem pop 11/2013 in setting of 1st toe osteo/gangrene  . Paroxysmal atrial fibrillation (HCC)    a. anticoagulated with Eliquis 10/2014  . 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 RA (rheumatoid arthritis) ([place])    prior tx by Dr. Ouida Sills  . Sjogren's syndrome ([place]) 06/02/13   pt denies this (12/01/13)  . SLE (systemic lupus erythematosus) ([place]) dx  05/2013   follows with rheum Ouida Sills)  . Tachy-brady syndrome (HCC)    a. s/p STJ Leadless pacemaker 11-04-2014 Dr Rayann Heman  . 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 Wears dentures   . Wears glasses     Assessment: 64 yr old female direct admit from vascular clinic, where she presented today for a wound check. She presented to the hospital with ischemic LL extremity and emergently underwent thrombectomy with femoral to distal bypass on 09/25/2019. During same hospital admission, she required L AKA due to extensive tissue loss. After discharge, she fell onto her AKA stump, underwent I&D of L AKA stump incision, with wound vac dressing placed. Pt presented to vascular clinic today with complaints of severe pain at stump. Pt has a fib, for which she takes apixaban at home (last dose ~10 AM this morning, per pt). Pharmacy is consulted to dose heparin for a fib while apixaban is on hold.   Last H/H (11/03/19) was WNL, platelet count 422; CBC this admission is pending.  Since apixaban will influence heparin levels at this point, will monitor anticoagulation using aPTT until aPTT and heparin levels correlate.    Goal of Therapy:  Heparin level 0.3-0.7 units/ml Monitor platelets by anticoagulation protocol: Yes   Plan:  Start heparin infusion at 600 units/hr at 2200 this evening (no bolus) Check  6-hr aPTT, heparin level Monitor daily aPTT, heparin level, CBC Monitor for signs/symptoms of bleeding  Vicki Mallet, PharmD, BCPS, [facility] Clinical Pharmacist 11/18/2019,5:58 PM

## 2019-11-18 NOTE — Progress Notes (Signed)
POST OPERATIVE OFFICE NOTE    CC:  F/u for surgery  HPI:  This is a 64 y.o. female who Mercedes [name] is a 64 y.o. ([birth year]) female who presents for wound check.  She presented to the hospital with ischemic left lower extremity and emergently underwent thrombectomy with femoral to distal bypass by Dr. Arbie Cookey on 09/25/2019.  During same hospital admission she required left above-the-knee amputation due to extensive tissue loss.  After discharge she fell onto her AKA stump.  She under went irrigation and debridement of the left AKA stump incision.  Wound vac dressing was placed and she was discharged with [facility] RN 3 times a week on 11/04/2019.  She complains of sever pain that her PO medication does not help.  She states she can't sleep or eat.    She is on Crestor, BB and  Eliquis for paroxysmal atrial fibrillation.    Allergies  Allergen Reactions  . Brilinta [Ticagrelor] Anaphylaxis and Other (See Comments)    Also, chest tightness  . Latex Rash  . Tape Rash    Current Outpatient Medications  Medication Sig Dispense Refill  . acetaminophen (TYLENOL) 500 MG tablet Take 500-1,000 mg by mouth every 8 (eight) hours as needed for mild pain or headache.    . DULoxetine (CYMBALTA) 30 MG capsule Take 1 capsule (30 mg total) by mouth daily. Qam (Patient taking differently: Take 30 mg by mouth daily. ) 30 capsule 0  . ELIQUIS 5 MG TABS tablet TAKE 1 TABLET BY MOUTH TWICE DAILY (Patient taking differently: Take 5 mg by mouth 2 (two) times daily. ) 180 tablet 3  . lisinopril (ZESTRIL) 10 MG tablet Take 1 tablet (10 mg total) by mouth daily. 90 tablet 2  . metoprolol tartrate (LOPRESSOR) 25 MG tablet Take 0.5 tablets (12.5 mg total) by mouth 2 (two) times daily. 180 tablet 3  . Multiple Vitamin (MULTIVITAMIN WITH MINERALS) TABS tablet Take 1 tablet by mouth daily. (Patient taking differently: Take 1 tablet by mouth 3 (three) times a week. )    . nitroGLYCERIN (NITROSTAT) 0.4 MG SL tablet PLACE AND  DISSOLVE 1 TABLET UNDER THE TONGUE EVERY 5 MINUTES AS NEEDED FOR CHEST PAIN (Patient taking differently: Place 0.4 mg under the tongue every 5 (five) minutes as needed for chest pain. ) 25 tablet 3  . oxyCODONE (OXY IR/ROXICODONE) 5 MG immediate release tablet Take 1 tablet (5 mg total) by mouth every 6 (six) hours as needed for severe pain. 20 tablet 0  . rosuvastatin (CRESTOR) 40 MG tablet Take 1 tablet (40 mg total) by mouth daily. (Patient not taking: Reported on 11/18/2019) 90 tablet 3   No current facility-administered medications for this visit.     ROS:  See HPI Vitals:   11/18/19 1421  BP: (!) 141/79  Pulse: 85  Resp: 16  Temp: 98.2 F (36.8 C)  SpO2: 100%     Physical Exam:  General:  WDWN in NAD; vital signs documented above Gait: Not observed HENT: WNL, normocephalic Pulmonary: normal non-labored breathing , without Rales, rhonchi,  wheezing Cardiac: regular HR Abdomen: soft, NT, no masses Skin: without rashes Extremities: Left medial AKA incision with increased warmth and erythema.  No purulent drainage.  Medial thigh/incision very tender to lite touch.      Musculoskeletal: no muscle wasting or atrophy       Neurologic: A&O X 3;  No focal weakness or paresthesias are detected Psychiatric:  The pt has Normal affect.  Assessment/Plan:  This  is a 64 y.o. female who is s/p: left AKA with recent debridement and wound vac placement.  Her pain is sever and she can't sleep or eat.  Removed wound vac today in office and she almost passed out from pain response.     -Plan to admit for pain control, wound culture, and antibiotic treatment.  I will hold her Eliquis and start her on Heparin pending return to OR for Irrigation and debridement.   I have made her NPO past MN pending further work up.     Mercedes Horseman PA-C [facility] [phone]  Clinic MD:  Early

## 2019-11-18 NOTE — Progress Notes (Signed)
Pt received from home. Oriented to room and call bell. CHG wipes applied. Tele box applied and CCMD called. VSS. Call bell in reach. Will continue to monitor.  Hazle Nordmann, RN

## 2019-11-19 MED ORDER — APIXABAN 5 MG PO TABS
5.0000 mg | ORAL_TABLET | Freq: Two times a day (BID) | ORAL | Status: DC
  Administered 2019-11-19 – 2019-11-24 (×11): 5 mg via ORAL
  Filled 2019-11-19 (×11): qty 1

## 2019-11-19 NOTE — Progress Notes (Signed)
[facility] [place]  Subjective  - pain in stump, no drainage, skin edge hurts   Objective 135/87 75 99.3 F (37.4 C) (Oral) 16 99%  Intake/Output Summary (Last 24 hours) at 11/19/2019 0272 Last data filed at 11/19/2019 0439 Gross per 24 hour  Intake 605.38 ml  Output --  Net 605.38 ml   Left AKA no drainage no erythema very tender to event touch skin Wound is clean 3 x 1 cm 4 mm depth otherwise staples intact  Assessment/Planning: Pain open wound left aka no obvious infection just seems very sensitive to wound care  Continue PCA for now  Will get CT left leg tomorrow if continued pain but no real pointing signs of infection   Change wound care to hydrogel once daily  Mercedes [name] 11/19/2019 8:32 AM --  Laboratory Lab Results: Recent Labs    11/18/19 1841  WBC 6.3  HGB 10.8*  HCT 35.7*  PLT 459*   BMET Recent Labs    11/18/19 1841  NA 132*  K 3.4*  CL 98  CO2 24  GLUCOSE 105*  BUN 9  CREATININE 0.86  CALCIUM 9.2    COAG Lab Results  Component Value Date   INR 1.0 11/18/2019   INR 1.0 11/03/2019   INR 1.1 10/01/2019   No results found for: PTT

## 2019-11-19 NOTE — Progress Notes (Signed)
Pt needed a 2nd IV to administer the IV heparin ordered because it's incompatable with the antibiotics ordered.  Ordered IV team to place a new IV but the pt refused saying she did not want to be "stuck again right now." Pt was educated on the importance of starting her IV heparin, but she still refused saying she wanted to be left alone.  Informed pharmacy the heparin would not be started tonight.  Will continue to monitor.  Harriet Masson, RN

## 2019-11-20 ENCOUNTER — Inpatient Hospital Stay (HOSPITAL_COMMUNITY): Payer: Medicare Other

## 2019-11-20 ENCOUNTER — Encounter: Payer: Medicare Other | Admitting: Vascular Surgery

## 2019-11-20 NOTE — Progress Notes (Addendum)
Dilaudid PCA discontinued per order. 28 ml wasted with Lawerance Cruel, RN in stericycle.  Theophilus Kinds, RN

## 2019-11-20 NOTE — Progress Notes (Signed)
CT images reviewed.  No abscess no evidence of osteo.  Not really sure what source of her pain is will continue local wound care in hospital and pain management for now  Fabienne Bruns, MD [facility] [place] Office: [phone]

## 2019-11-20 NOTE — Progress Notes (Addendum)
  Progress Note    11/20/2019 7:27 AM   Subjective:  Left thigh still painful, states is very tender   Vitals:   11/20/19 0000 11/20/19 0436  BP:  120/72  Pulse:  (!) 56  Resp: 17 14  Temp:  98.8 F (37.1 C)  SpO2: 100% 100%   Physical Exam: General: well appearing, not in any acute distress Lungs:  Non labored Incisions: left aka with some purulent drainage on compression, unable to really try and express a lot due to patient discomfort, very tender. No erythema. The medial dehisced area is about 3 cm x 1 cm otherwise the medial-lateral incision has staples intact and well healed, clean and dry Neurologic: alert and oriented  CBC    Component Value Date/Time   WBC 6.3 11/18/2019 1841   RBC 4.59 11/18/2019 1841   HGB 10.8 (L) 11/18/2019 1841   HGB 14.2 10/09/2018 1125   HCT 35.7 (L) 11/18/2019 1841   HCT 44.6 10/09/2018 1125   PLT 459 (H) 11/18/2019 1841   PLT 289 10/09/2018 1125   MCV 77.8 (L) 11/18/2019 1841   MCV 79 10/09/2018 1125   MCH 23.5 (L) 11/18/2019 1841   MCHC 30.3 11/18/2019 1841   RDW 16.5 (H) 11/18/2019 1841   RDW 14.8 10/09/2018 1125   LYMPHSABS 2.5 05/22/2019 1816   MONOABS 0.6 05/22/2019 1816   EOSABS 0.4 05/22/2019 1816   BASOSABS 0.1 05/22/2019 1816    BMET    Component Value Date/Time   NA 132 (L) 11/18/2019 1841   NA 135 10/09/2018 1125   K 3.4 (L) 11/18/2019 1841   CL 98 11/18/2019 1841   CO2 24 11/18/2019 1841   GLUCOSE 105 (H) 11/18/2019 1841   BUN 9 11/18/2019 1841   BUN 10 10/09/2018 1125   CREATININE 0.86 11/18/2019 1841   CREATININE 1.13 (H) 08/03/2016 1440   CALCIUM 9.2 11/18/2019 1841   GFRNONAA >60 11/18/2019 1841   GFRAA >60 11/18/2019 1841    INR    Component Value Date/Time   INR 1.0 11/18/2019 1844     Intake/Output Summary (Last 24 hours) at 11/20/2019 0728 Last data filed at 11/20/2019 0600 Gross per 24 hour  Intake 1068.27 ml  Output 850 ml  Net 218.27 ml     Assessment/Plan:  64 y.o. female is s/p  left AKA that is very tender and draining from the medial open dehisced area. Continue PCA. Remains on Zosyn. Since her tenderness is not improved and I feel that she has some purulent drainage from the medial wound area I will order a CT of her leg to evaluate for fluid collection. Continue current wound care  DVT prophylaxis:  Apixaban   Graceann Congress, PA-C [facility] [phone] 11/20/2019 7:28 AM    Still with significant pain left AKA Says PCA doesn't work Will d/c PCA Get non contrast CT of left stump today  Fabienne Bruns, MD [facility] [place] Office: [phone]

## 2019-11-21 ENCOUNTER — Encounter (HOSPITAL_COMMUNITY): Payer: Self-pay | Admitting: Vascular Surgery

## 2019-11-21 LAB — AEROBIC CULTURE W GRAM STAIN (SUPERFICIAL SPECIMEN)

## 2019-11-21 LAB — BASIC METABOLIC PANEL
Anion gap: 7 (ref 5–15)
BUN: 7 mg/dL — ABNORMAL LOW (ref 8–23)
CO2: 25 mmol/L (ref 22–32)
Calcium: 8.7 mg/dL — ABNORMAL LOW (ref 8.9–10.3)
Chloride: 104 mmol/L (ref 98–111)
Creatinine, Ser: 0.77 mg/dL (ref 0.44–1.00)
GFR calc Af Amer: >60 mL/min (ref >60)
GFR calc non Af Amer: >60 mL/min (ref >60)
Glucose, Bld: 96 mg/dL (ref 70–99)
Potassium: 4.1 mmol/L (ref 3.5–5.1)
Sodium: 136 mmol/L (ref 135–145)

## 2019-11-21 MED ORDER — SODIUM CHLORIDE 0.9 % IV SOLN
3.0000 g | Freq: Four times a day (QID) | INTRAVENOUS | Status: DC
  Administered 2019-11-21 – 2019-11-24 (×11): 3 g via INTRAVENOUS
  Filled 2019-11-21: qty 8
  Filled 2019-11-21 (×3): qty 3
  Filled 2019-11-21: qty 8
  Filled 2019-11-21 (×2): qty 3
  Filled 2019-11-21: qty 8
  Filled 2019-11-21 (×3): qty 3
  Filled 2019-11-21: qty 8
  Filled 2019-11-21: qty 3
  Filled 2019-11-21: qty 8
  Filled 2019-11-21: qty 3

## 2019-11-21 NOTE — Progress Notes (Addendum)
   VASCULAR SURGERY ASSESSMENT & PLAN:   S/p left AKA on 09/30/2019 with subsequent debridement in OR with wound VAC placement on 11/03/2019. Admitted  11/19/2019 secondary to poor healing of medial aspect of incision. Group B strep and diphtheroids on culture. De-escalate antibiotics today to Unasyn. Damp-to-dry dressing changes BID.  Imaging: CT of residual limb negative for abscess.  Sinus bradycardia overnight noted.  She was out of bed yesterday without symptoms, but asked PT to come later when seen today.  BP stable.   SUBJECTIVE:   Continues to complain of hypersensitivity to incision.   PHYSICAL EXAM:   Vitals:   11/20/19 2000 11/21/19 0512 11/21/19 0820 11/21/19 1020  BP: 116/81 (!) 141/80 119/67   Pulse: (!) 52  (!) 48 (!) 58  Resp: 12 12 16    Temp: 98 F (36.7 C) 97.7 F (36.5 C) 98.6 F (37 C)   TempSrc: Oral Oral Oral   SpO2: 100% 100% 100%   Weight:      Height:       HR last 24 hours 48-70 bpm  LABS:   Lab Results  Component Value Date   CREATININE 0.77 11/21/2019     PROBLEM LIST:    Active Problems:   PAD (peripheral artery disease) (HCC)   CURRENT MEDS:   . apixaban  5 mg Oral BID  . DULoxetine  30 mg Oral Daily  . metoprolol tartrate  12.5 mg Oral BID  . pantoprazole  40 mg Oral Daily  . rosuvastatin  40 mg Oral q1800    11/23/2019, PA-C Office: [phone] 11/21/2019   Patient's pain may be slightly improved today but still requiring IV narcotic pain medication.  No obvious abscess.  Still significant pain left AKA not suitable for discharge to home until this is better controlled  Wound on edge of AKA is open with no purulent drainage small amount of fibrinous exudate but otherwise appears to be granulating  Continue antibiotics  Continue current pain regimen  Possible discharge home on Monday if pain is reasonably controlled on a low-dose pain management program.  Wednesday, MD [facility]  [place] Office: [phone]

## 2019-11-21 NOTE — Progress Notes (Signed)
MOBILITY TEAM - Progress Note   11/21/19 1032  Mobility  Activity Ambulated in room  Level of Assistance Modified independent, requires aide device or extra time  Assistive Device Front wheel walker  Distance Ambulated (ft) 40 ft  Mobility Response Tolerated well  Mobility performed by Mobility specialist  Bed Position Chair   Patient received transferring to recliner with RN; agreeable to ambulating in room. Mod indep with walker, hopping well on RLE with strong BUE support; only requiring assist to manage IV pole. Pt happy for opportunity to mobilize. Encouraged L hip flexor stretch by resting in extension.   Ina Homes, PT, DPT Mobility Team Pager [phone]

## 2019-11-21 NOTE — Progress Notes (Addendum)
  Progress Note    11/21/2019 7:34 AM   Subjective: She states her pain is somewhat improved today.  She tells me her heart rate is dropping into the 40s  S/p previous superficial debridement of left AKA 11/03/2019  Vitals:   11/20/19 2000 11/21/19 0512  BP: 116/81 (!) 141/80  Pulse: (!) 52   Resp: 12 12  Temp: 98 F (36.7 C) 97.7 F (36.5 C)  SpO2: 100% 100%    Physical Exam: Cardiac:  RRR Lungs:  nonlabored Incisions: Dressing removed.  Small area of skin edge separation medially.  No purulent drainage Extremities: No issues with right lower extremity    CBC    Component Value Date/Time   WBC 6.3 11/18/2019 1841   RBC 4.59 11/18/2019 1841   HGB 10.8 (L) 11/18/2019 1841   HGB 14.2 10/09/2018 1125   HCT 35.7 (L) 11/18/2019 1841   HCT 44.6 10/09/2018 1125   PLT 459 (H) 11/18/2019 1841   PLT 289 10/09/2018 1125   MCV 77.8 (L) 11/18/2019 1841   MCV 79 10/09/2018 1125   MCH 23.5 (L) 11/18/2019 1841   MCHC 30.3 11/18/2019 1841   RDW 16.5 (H) 11/18/2019 1841   RDW 14.8 10/09/2018 1125   LYMPHSABS 2.5 05/22/2019 1816   MONOABS 0.6 05/22/2019 1816   EOSABS 0.4 05/22/2019 1816   BASOSABS 0.1 05/22/2019 1816    BMET    Component Value Date/Time   NA 132 (L) 11/18/2019 1841   NA 135 10/09/2018 1125   K 3.4 (L) 11/18/2019 1841   CL 98 11/18/2019 1841   CO2 24 11/18/2019 1841   GLUCOSE 105 (H) 11/18/2019 1841   BUN 9 11/18/2019 1841   BUN 10 10/09/2018 1125   CREATININE 0.86 11/18/2019 1841   CREATININE 1.13 (H) 08/03/2016 1440   CALCIUM 9.2 11/18/2019 1841   GFRNONAA >60 11/18/2019 1841   GFRAA >60 11/18/2019 1841     Intake/Output Summary (Last 24 hours) at 11/21/2019 0734 Last data filed at 11/21/2019 0600 Gross per 24 hour  Intake 2153.9 ml  Output 1800 ml  Net 353.9 ml    HOSPITAL MEDICATIONS Scheduled Meds: . apixaban  5 mg Oral BID  . DULoxetine  30 mg Oral Daily  . metoprolol tartrate  12.5 mg Oral BID  . pantoprazole  40 mg Oral Daily  .  rosuvastatin  40 mg Oral q1800   Continuous Infusions: . sodium chloride 50 mL/hr at 11/21/19 0600  . piperacillin-tazobactam (ZOSYN)  IV 3.375 g (11/21/19 0509)  . vancomycin Stopped (11/20/19 1051)   PRN Meds:.acetaminophen, alum & mag hydroxide-simeth, guaiFENesin-dextromethorphan, hydrALAZINE, labetalol, metoprolol tartrate, nitroGLYCERIN, oxyCODONE, phenol  Assessment:  64 y.o. female is s/p:  Left AKA with dehiscence of left medial aspect of incision.  CT scan shows no abscess.  Remains on Zosyn.   Plan: -I placed a wet-to-dry dressing in the wound.  Recommend this to be done at least twice daily.  I will review her telemetry in regards to bradycardia. -DVT prophylaxis:  apixaban   Wendi Maya, PA-C [facility] [phone] 11/21/2019  7:34 AM

## 2019-11-21 NOTE — Progress Notes (Signed)
MOBILITY TEAM - Progress Note   11/21/19 1300  Mobility  Activity Refused mobility  Mobility performed by Mobility specialist  Bed Position Chair   Patient reports just finishing lunch and "I want to digest." Offered to come back later and pt reports, "I don't know, I feel a nap coming on." Will check back for mobility as schedule permits.  Ina Homes, PT, DPT Mobility Team Pager [phone]

## 2019-11-22 MED ORDER — OXYCODONE HCL 5 MG PO TABS
5.0000 mg | ORAL_TABLET | ORAL | Status: DC | PRN
  Administered 2019-11-22: 5 mg via ORAL
  Administered 2019-11-22 – 2019-11-23 (×4): 10 mg via ORAL
  Administered 2019-11-24: 5 mg via ORAL
  Filled 2019-11-22: qty 2
  Filled 2019-11-22 (×2): qty 1
  Filled 2019-11-22 (×3): qty 2

## 2019-11-22 NOTE — Progress Notes (Signed)
   VASCULAR SURGERY ASSESSMENT & PLAN:   SMALL WOUND LEFT AKA: I inspected the wound today.  This is fairly clean.  Continue local wound care.  ID: She is on IV Unasyn.  DISPOSITION: Possibly home Monday.  SUBJECTIVE:   Pain better controlled.  PHYSICAL EXAM:   Vitals:   11/21/19 1020 11/21/19 1723 11/21/19 2010 11/22/19 0352  BP:  135/86 124/73 (!) 141/77  Pulse: (!) 58 63 (!) 56 (!) 49  Resp:  15 13 12   Temp:  98.6 F (37 C) 98.8 F (37.1 C) 98.6 F (37 C)  TempSrc:  Oral Oral Oral  SpO2:  100% 95% 100%  Weight:      Height:       I inspected her wound today and this is clean with no significant drainage.  LABS:   PROBLEM LIST:    Active Problems:   PAD (peripheral artery disease) (HCC)   CURRENT MEDS:   . apixaban  5 mg Oral BID  . DULoxetine  30 mg Oral Daily  . metoprolol tartrate  12.5 mg Oral BID  . pantoprazole  40 mg Oral Daily  . rosuvastatin  40 mg Oral q1800    Office: [phone] 11/22/2019

## 2019-11-23 NOTE — Progress Notes (Signed)
   VASCULAR SURGERY ASSESSMENT & PLAN:   SMALL WOUND LEFT AKA: The wound looks fairly clean yesterday.  I have asked the floor to get hydrogel for the next dressing changes I think this will prevent the issue with the dressing sticking which is causing her a lot of discomfort.  Dr. Darrick Penna will be back tomorrow and I suspect that she will be ready to go home on po ABx with home health doing the dressing change daily with hydrogel.  CARDIAC: She says that her pacemaker is not working right because her heart rate has been running low at times.  She is asymptomatic.  We may need to have cardiology check on her tomorrow prior to discharge.  ID: She is on IV Unasyn.  DISPOSITION: Possibly home tomorrow.   SUBJECTIVE:   Pain better controlled.  She does tell me that her "pacemaker does not work."  She denies chest pain or shortness of breath or palpitations.  PHYSICAL EXAM:   Vitals:   11/22/19 1707 11/22/19 2040 11/23/19 0456 11/23/19 0749  BP: 116/75 136/67 (!) 157/67   Pulse: 61 (!) 47 (!) 51 76  Resp: 15 12 15    Temp: 98.7 F (37.1 C) 98.6 F (37 C) 98.5 F (36.9 C)   TempSrc: Oral Oral Oral   SpO2: 100% 100% 100%   Weight:      Height:       The dressing is dry.  LABS:   No new labs  PROBLEM LIST:    Active Problems:   PAD (peripheral artery disease) (HCC)  CURRENT MEDS:   . apixaban  5 mg Oral BID  . DULoxetine  30 mg Oral Daily  . metoprolol tartrate  12.5 mg Oral BID  . pantoprazole  40 mg Oral Daily  . rosuvastatin  40 mg Oral q1800   Office: [phone] 11/23/2019

## 2019-11-23 NOTE — Plan of Care (Signed)

## 2019-11-24 ENCOUNTER — Encounter: Payer: Self-pay | Admitting: *Deleted

## 2019-11-24 DIAGNOSIS — Z006 Encounter for examination for normal comparison and control in clinical research program: Secondary | ICD-10-CM

## 2019-11-24 LAB — BASIC METABOLIC PANEL
Anion gap: 7 (ref 5–15)
BUN: 5 mg/dL — ABNORMAL LOW (ref 8–23)
CO2: 27 mmol/L (ref 22–32)
Calcium: 8.8 mg/dL — ABNORMAL LOW (ref 8.9–10.3)
Chloride: 105 mmol/L (ref 98–111)
Creatinine, Ser: 0.79 mg/dL (ref 0.44–1.00)
GFR calc Af Amer: >60 mL/min (ref >60)
GFR calc non Af Amer: >60 mL/min (ref >60)
Glucose, Bld: 88 mg/dL (ref 70–99)
Potassium: 4.1 mmol/L (ref 3.5–5.1)
Sodium: 139 mmol/L (ref 135–145)

## 2019-11-24 MED ORDER — OXYCODONE HCL 5 MG PO TABS: 5.0000 mg | ORAL_TABLET | Freq: Four times a day (QID) | ORAL | 0 refills | Status: DC | PRN

## 2019-11-24 MED ORDER — AMOXICILLIN-POT CLAVULANATE 875-125 MG PO TABS: 1.0000 | ORAL_TABLET | Freq: Two times a day (BID) | ORAL | 0 refills | Status: AC

## 2019-11-24 NOTE — Care Management Important Message (Signed)
Important Message  Patient Details  Name: Mercedes [name] MRN: [id number] Date of Birth: Apr 01, 1956   Medicare Important Message Given:  Yes     [name] 11/24/2019, 10:17 AM

## 2019-11-24 NOTE — Discharge Instructions (Signed)
Wound Dehiscence  Wound dehiscence is when a cut from surgery (an incision) opens up and does not heal like it should. This problem usually happens 7-10 days after surgery. You may have bleeding from the cut. You may also have pain or a fever. This condition should be treated early. Follow these instructions at home: Medicines  Take over-the-counter and prescription medicines only as told by your doctor.  If you were prescribed an antibiotic medicine, take it as told by your doctor. Do not stop taking it even if you start to feel better.  Use medicines that help to stop itching as told by your doctor. The wound may feel itchy as it heals. Wound care   Follow instructions from your doctor about how to take care of your wound. Make sure you: ? Wash your hands with soap and water before you change your bandage (dressing) or wash the wound area. If you cannot use soap and water, use hand sanitizer. ? Wash your wound with mild soap and water 2 times a day, or as told. Rinse off the soap. Pat the area dry with a clean towel. Do not rub the wound. ? Change bandages as told by your doctor.  Do not pick or scratch at the wound.  Check your wound area every day for signs of infection. Check for: ? More redness, swelling, or pain. ? More fluid or blood. ? Warmth. ? Pus or a bad smell. Activity  Avoid exercises that make you sweat or could stretch your wound.  Do not lift anything that is heavier than 10 lb (4.5 kg) until the wound is healed or until your doctor says that it is safe. General instructions  Do not take baths, swim, or use a hot tub until your doctor approves. You may take showers.  Keep all follow-up visits as told by your doctor. This is important. Contact a doctor if:  Your wound does not seem to be healing right.  You have a fever. Get help right away if:  You have more redness, swelling, or pain around your wound.  You have more fluid coming from your  wound.  Your wound feels warm to the touch.  You have pus or a bad smell coming from your wound.  More of the wound breaks open.  You have red streaks spreading from your wound.  You have a lot of bleeding from your wound. Summary  Wound dehiscence is when a cut from surgery (an incision) opens up and does not heal like it should.  Follow instructions from your doctor about how to take care of your wound.  Check your wound area every day for signs of infection. These signs can be redness, swelling, pain, fluid, blood, warmth, pus, or a bad smell.  If you were prescribed an antibiotic medicine, take it as told by your doctor. Do not stop taking it even if you start to feel better.  Contact a doctor if your wound does not seem to be healing right. This information is not intended to replace advice given to you by your health care provider. Make sure you discuss any questions you have with your health care provider. Document Revised: 07/27/2017 Document Reviewed: 07/19/2016 Elsevier Patient Education  2020 Elsevier Inc.  

## 2019-11-24 NOTE — Progress Notes (Addendum)
   VASCULAR SURGERY ASSESSMENT & PLAN:  SMALL WOUND LEFT AKA: The wound has fibrinous exudate, but no drainage or erythema. Await Dr. Darrick Penna' assessment regarding home with [facility] wound care with Hydrogel and  po ABx.  Decide on timing of removing remaining staples.  ID:She is on IV Unasyn.  DISPOSITION: Possibly home today.   SUBJECTIVE:   "I'm doing better". No SOB or CP.  PHYSICAL EXAM:   Vitals:   11/23/19 1640 11/23/19 2005 11/23/19 2243 11/24/19 0435  BP: 138/67 (!) 162/83  (!) 149/78  Pulse:   (!) 55   Resp: 14   15  Temp:  97.9 F (36.6 C)  98.1 F (36.7 C)  TempSrc:  Oral  Oral  SpO2:  96%  99%  Weight:      Height:        Physical Exam: Cardiac:  RRR Lungs:  nonlabored Incisions/left AKA: Dressing removed.  Fibrinous exudate.  No purulent drainage or peri-wound erythema. Remaining staple line intact. Redressed with Hydrogel, foam border dressing  LABS:   Lab Results  Component Value Date   WBC 6.3 11/18/2019   HGB 10.8 (L) 11/18/2019   HCT 35.7 (L) 11/18/2019   MCV 77.8 (L) 11/18/2019   PLT 459 (H) 11/18/2019   Lab Results  Component Value Date   CREATININE 0.77 11/21/2019   Lab Results  Component Value Date   INR 1.0 11/18/2019   CBG (last 3)  No results for input(s): GLUCAP in the last 72 hours.  PROBLEM LIST:    Active Problems:   PAD (peripheral artery disease) (HCC)   CURRENT MEDS:   . apixaban  5 mg Oral BID  . DULoxetine  30 mg Oral Daily  . metoprolol tartrate  12.5 mg Oral BID  . pantoprazole  40 mg Oral Daily  . rosuvastatin  40 mg Oral [address]    Wendi Maya, PA-C Office: [phone] 11/24/2019   Wound overall about the same but pain control much better Leave staples at least 2 more weeks Follow up 2 weeks Hydrogel for open wound qd  home health  Fabienne Bruns, MD [facility] [place] Office: [phone]

## 2019-11-24 NOTE — Discharge Summary (Signed)
Discharge Summary  Patient ID: Mercedes [name] [id number] 64 y.o. [birth year]  Admit date: 11/18/2019  Discharge date and time: No discharge date for patient encounter.   Admitting Physician: Sherren Kerns, MD   Discharge Physician: Dr. 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  She was returned to the operating room on November 03, 2019 and underwent debridement of necrotic skin edges and superficial subcutaneous tissue of her left medial AKA site.  A wound VAC dressing was placed.  She was discharged home with home health.  On follow-up, she complained of intractable incisional pain and was admitted for further evaluation.  From antibiotics.  She underwent CT scanning of her residual limb that revealed no abscess or osteomyelitis.  Wound cultures were obtained.  She remained afebrile with stable vital signs. Wound cultures revealed group B strep and diphtheroids.  Antibiotics were deescalated to intravenous Unasyn.  She continued to have daily wound care.  Her pain tolerance improved.  On the day of discharge, she was tolerating dressing changes with hydrogel and was ready for discharge home with continued home health.  Augmentin x7 days. Consults: None  Treatments: antibiotics: Unasyn  Discharge Exam:  Vitals:   11/24/19 0435 11/24/19 1356  BP: (!) 149/78 (!) 158/78  Pulse:  63  Resp: 15 14  Temp: 98.1 F (36.7 C) 98.2 F (36.8 C)  SpO2: 99% 91%   Cardiac: Heart rate and rhythm are regular Lungs: Clear to auscultation bilaterally Incisions: Medial skin edge separation with  fibrinous exudate.  No periwound erythema or purulence.  The remaining incision is well approximated Extremities: Right lower extremity without skin changes Abdomen: Soft nondistended Neurologic: Alert and oriented x4, cranial nerves II through XII grossly intact   Disposition: Discharge disposition: 01-Home or Self Care       Patient Instructions:  Allergies as of 11/24/2019      Reactions   Brilinta [ticagrelor] Anaphylaxis, Other (See Comments)   Also, chest tightness   Latex Rash   Tape Rash      Medication List    TAKE these medications   acetaminophen 500 MG tablet Commonly known as: TYLENOL Take 500-1,000 mg by mouth every 8 (eight) hours as needed for mild pain or headache.   amoxicillin-clavulanate 875-125 MG tablet Commonly known as: Augmentin Take 1 tablet by mouth every 12 (twelve) hours for 7 days.   DULoxetine 30 MG capsule Commonly known as: CYMBALTA Take 1 capsule (30 mg total) by mouth daily. Qam   Eliquis 5 MG Tabs tablet Generic drug: apixaban TAKE 1 TABLET BY MOUTH TWICE DAILY What changed: how much to take   Ensure Max Protein Liqd Take 8 oz by mouth 2 (two) times daily after a meal.   lisinopril 10 MG tablet Commonly known as: ZESTRIL Take 1 tablet (10 mg total) by mouth daily.   metoprolol tartrate 25 MG tablet Commonly known as: LOPRESSOR Take 0.5 tablets (12.5 mg total) by mouth 2 (two) times daily.   multivitamin with minerals Tabs tablet Take 1 tablet by mouth daily. What changed: when to take this   nitroGLYCERIN 0.4 MG SL tablet Commonly known as: NITROSTAT PLACE AND DISSOLVE 1 TABLET UNDER  THE TONGUE EVERY 5 MINUTES AS NEEDED FOR CHEST PAIN What changed: See the new instructions.   oxyCODONE 5 MG immediate release tablet Commonly known as: Roxicodone Take 1 tablet (5 mg total) by mouth every 6 (six) hours as needed for severe pain. What changed: how much to take   rosuvastatin 40 MG tablet Commonly known as:  CRESTOR Take 1 tablet (40 mg total) by mouth daily.      Activity: activity as tolerated Diet: cardiac diet Wound Care: as directed Hydrogel to left AKA incisional wound covered with damp saline moistened gauze.  Cover with dry gauze.  Change daily.  Follow-up with Dr. Oneida Alar in 2 weeks.  Signed: Barbie Banner, PA-C 11/24/2019 4:11 PM [facility] Office: [phone]

## 2019-11-24 NOTE — Progress Notes (Signed)
MOBILITY TEAM - Progress Note   11/24/19 1500  Mobility  Activity Dangled on edge of bed;Transferred to/from [facility]  Level of Assistance Independent after set-up  Assistive Device None  Mobility Response Tolerated well  Bed Position Semi-fowlers   Post-activity: HR 59   Patient modified independent with transfers from bed<>BSC and bed<>recliner. Declined ambulation secondary to fatigue and preparing for d/c home. Noted improved LLE hip extension compared to last week; encouraged pt to continue resting with L hip ext to prevent flexor contractures.   Ina Homes, PT, DPT Mobility Team Pager [phone]

## 2019-11-24 NOTE — Research (Signed)
Patient was seen on 11-21-2019,while in the hospital for an unrelated problem- AKA wound dehscience. Patient states that her heart rate has been in the low 50's over the past few days. Medications were reviewed. No pacemaker evaluation was performed due to the fact that it has not worked in several months now. Dr. Johney Frame is aware of this but still wants research nursing to keep track of this patient.

## 2019-11-24 NOTE — Plan of Care (Signed)
DISCHARGE NOTE HOME Mercedes [name] to be discharged home per MD order. Discussed prescriptions and follow up appointments with the patient. Prescriptions given to patient; medication list explained in detail. Patient verbalized understanding.  Wound care supplies given to patient to be able to do wound care until home health comes to see her.  IV catheter discontinued intact. Site without signs and symptoms of complications. Dressing and pressure applied. Pt denies pain at the site currently. No complaints noted.  Patient free of lines and drains.   An After Visit Summary (AVS) was printed and given to the patient. Patient to be escorted via wheelchair, and discharged home via private auto.  Arlice Colt, RN

## 2019-11-24 NOTE — TOC Transition Note (Signed)
Transition of Care [name]) - CM/SW Discharge Note Donn Pierini RN, BSN Transitions of Care Unit 4E- RN Case Manager [phone]    Patient Details  Name: Mercedes [name] MRN: [id number] Date of Birth: 06-04-1956  Transition of Care [facility]) CM/SW Contact:  Darrold Span, RN Phone Number: 11/24/2019, 5:13 PM   Clinical Narrative:    Pt stable for transition home today, orders have been placed to resume HHPT/RN (wound care)-pt active with [facility]- notified Kandee Keen with [facility] for resumption of care.    Final next level of care: Home w Home Health Services Barriers to Discharge: Barriers Resolved   Patient Goals and CMS Choice Patient states their goals for this hospitalization and ongoing recovery are:: return home   Choice offered to / list presented to : Patient  Discharge Placement             Home with [facility]          Discharge Plan and Services   Discharge Planning Services: CM Consult Post Acute Care Choice: Home Health, Resumption of Svcs/PTA Provider                    HH Arranged: RN, PT [facility] Agency: [facility] Date [facility] Agency Contacted: 11/24/19 Time HH Agency Contacted: 1530 Representative spoke with at [facility] Agency: Kandee Keen  Social Determinants of Health (SDOH) Interventions     Readmission Risk Interventions Readmission Risk Prevention Plan 10/06/2019 05/16/2019  Transportation Screening Complete Complete  PCP or Specialist Appt within 5-7 Days Complete Complete  Home Care Screening Complete Complete  Medication Review (RN CM) Complete Complete  Some recent data might be hidden

## 2019-11-25 ENCOUNTER — Telehealth: Payer: Self-pay | Admitting: *Deleted

## 2019-11-25 NOTE — Telephone Encounter (Signed)
Pt was on TCM report admitted 11/18/19 PAD (peripheral artery disease).  On follow-up, she complained of intractable incisional painand was admitted for further evaluation.  From antibiotics.  She underwent CT scanning of her residual limb that revealed no abscess or osteomyelitis.  Wound cultures were obtained.  She remained afebrile with stable vital signs. Wound cultures revealed group B strep and diphtheroids.  Antibiotics were deescalated to intravenous Unasyn.  She continued to have daily wound care.  Her pain tolerance improved.  On the day of discharge, she was tolerating dressing changes with hydrogel and was ready for discharge home with continued home health w/  Augmentin x7 days. Pt D/C 11/24/19, and will follow-up with Sherren Kerns, MD (Vascular Surgery) in 2 days 11/26/2019.Marland KitchenRaechel Chute

## 2019-11-26 DIAGNOSIS — I2510 Atherosclerotic heart disease of native coronary artery without angina pectoris: Secondary | ICD-10-CM | POA: Diagnosis not present

## 2019-11-26 DIAGNOSIS — I10 Essential (primary) hypertension: Secondary | ICD-10-CM | POA: Diagnosis not present

## 2019-11-26 DIAGNOSIS — I70222 Atherosclerosis of native arteries of extremities with rest pain, left leg: Secondary | ICD-10-CM | POA: Diagnosis not present

## 2019-11-26 DIAGNOSIS — T82898D Other specified complication of vascular prosthetic devices, implants and grafts, subsequent encounter: Secondary | ICD-10-CM | POA: Diagnosis not present

## 2019-11-26 DIAGNOSIS — I252 Old myocardial infarction: Secondary | ICD-10-CM | POA: Diagnosis not present

## 2019-11-27 DIAGNOSIS — M79662 Pain in left lower leg: Secondary | ICD-10-CM | POA: Diagnosis not present

## 2019-11-28 DIAGNOSIS — I70222 Atherosclerosis of native arteries of extremities with rest pain, left leg: Secondary | ICD-10-CM | POA: Diagnosis not present

## 2019-11-28 DIAGNOSIS — T8744 Infection of amputation stump, left lower extremity: Secondary | ICD-10-CM | POA: Diagnosis not present

## 2019-11-28 DIAGNOSIS — B9689 Other specified bacterial agents as the cause of diseases classified elsewhere: Secondary | ICD-10-CM | POA: Diagnosis not present

## 2019-11-28 DIAGNOSIS — T8781 Dehiscence of amputation stump: Secondary | ICD-10-CM | POA: Diagnosis not present

## 2019-11-28 DIAGNOSIS — B951 Streptococcus, group B, as the cause of diseases classified elsewhere: Secondary | ICD-10-CM | POA: Diagnosis not present

## 2019-12-01 DIAGNOSIS — T8781 Dehiscence of amputation stump: Secondary | ICD-10-CM | POA: Diagnosis not present

## 2019-12-01 DIAGNOSIS — T8744 Infection of amputation stump, left lower extremity: Secondary | ICD-10-CM | POA: Diagnosis not present

## 2019-12-01 DIAGNOSIS — B951 Streptococcus, group B, as the cause of diseases classified elsewhere: Secondary | ICD-10-CM | POA: Diagnosis not present

## 2019-12-01 DIAGNOSIS — I70222 Atherosclerosis of native arteries of extremities with rest pain, left leg: Secondary | ICD-10-CM | POA: Diagnosis not present

## 2019-12-01 DIAGNOSIS — B9689 Other specified bacterial agents as the cause of diseases classified elsewhere: Secondary | ICD-10-CM | POA: Diagnosis not present

## 2019-12-04 DIAGNOSIS — B951 Streptococcus, group B, as the cause of diseases classified elsewhere: Secondary | ICD-10-CM | POA: Diagnosis not present

## 2019-12-04 DIAGNOSIS — I70222 Atherosclerosis of native arteries of extremities with rest pain, left leg: Secondary | ICD-10-CM | POA: Diagnosis not present

## 2019-12-04 DIAGNOSIS — B9689 Other specified bacterial agents as the cause of diseases classified elsewhere: Secondary | ICD-10-CM | POA: Diagnosis not present

## 2019-12-04 DIAGNOSIS — T8744 Infection of amputation stump, left lower extremity: Secondary | ICD-10-CM | POA: Diagnosis not present

## 2019-12-04 DIAGNOSIS — T8781 Dehiscence of amputation stump: Secondary | ICD-10-CM | POA: Diagnosis not present

## 2019-12-09 ENCOUNTER — Other Ambulatory Visit: Payer: Self-pay

## 2019-12-09 ENCOUNTER — Ambulatory Visit (INDEPENDENT_AMBULATORY_CARE_PROVIDER_SITE_OTHER): Payer: Self-pay | Admitting: Physician Assistant

## 2019-12-09 VITALS — BP 144/82 | HR 88 | Temp 98.1°F | Resp 14

## 2019-12-09 DIAGNOSIS — Z89612 Acquired absence of left leg above knee: Secondary | ICD-10-CM

## 2019-12-09 NOTE — Progress Notes (Signed)
POST OPERATIVE OFFICE NOTE    CC:  F/u for surgery  HPI:  This is a 64 y.o. female who is s/p left above knee amputation on 10/01/2019 by Dr. Oneida Alar.  She subsequently underwent debridement of left AKA and placement of vac dressing on 11/03/2019 also by Dr. Oneida Alar.  She was last seen on 11/24/2019 in the hospital and at that time, the wound had some fibrinous exudate but no drainage or erythema.  She was discharged home with [facility] for wound care with hydrogel with wet to dry dressings and Augmentin for 7 days.    She presents today for follow up.  She states that she has been doing well.  She has had a little bit of drainage, but not bad.  She denies any fevers.  She states that she really doesn't have an appetite and has lost about 20lbs.  She states that she really doesn't have an appetite and would like to take some meds that would stimulate this.  I explained that she definitely needs the nutrition to heal her wound. She states that she has felt a little depressed.  She states that before a week ago, she didn't even want to acknowledge the amputation and didn't want to leave the house or tell anyone about it, but in the past week, she states she has come around to it.  She tells me that she went to the [facility] and got her tags and went to have her eyebrows done.  She also feels that covid situation over the past year has also played a part in this.  She wants to schedule a phone visit with Dr. Quay Burow to discuss these issues.   She states that she is also back smoking a little bit but not much.    Allergies  Allergen Reactions  . Brilinta [Ticagrelor] Anaphylaxis and Other (See Comments)    Also, chest tightness  . Latex Rash  . Tape Rash    Current Outpatient Medications  Medication Sig Dispense Refill  . acetaminophen (TYLENOL) 500 MG tablet Take 500-1,000 mg by mouth every 8 (eight) hours as needed for mild pain or headache.    . DULoxetine (CYMBALTA) 30 MG capsule Take 1 capsule (30 mg total) by  mouth daily. Qam (Patient not taking: Reported on 11/24/2019) 30 capsule 0  . ELIQUIS 5 MG TABS tablet TAKE 1 TABLET BY MOUTH TWICE DAILY (Patient taking differently: Take 5 mg by mouth 2 (two) times daily. ) 180 tablet 3  . Ensure Max Protein (ENSURE MAX PROTEIN) LIQD Take 8 oz by mouth 2 (two) times daily after a meal.     . lisinopril (ZESTRIL) 10 MG tablet Take 1 tablet (10 mg total) by mouth daily. 90 tablet 2  . metoprolol tartrate (LOPRESSOR) 25 MG tablet Take 0.5 tablets (12.5 mg total) by mouth 2 (two) times daily. 180 tablet 3  . Multiple Vitamin (MULTIVITAMIN WITH MINERALS) TABS tablet Take 1 tablet by mouth daily. (Patient taking differently: Take 1 tablet by mouth 3 (three) times a week. )    . nitroGLYCERIN (NITROSTAT) 0.4 MG SL tablet PLACE AND DISSOLVE 1 TABLET UNDER THE TONGUE EVERY 5 MINUTES AS NEEDED FOR CHEST PAIN (Patient taking differently: Place 0.4 mg under the tongue every 5 (five) minutes as needed for chest pain. ) 25 tablet 3  . oxyCODONE (ROXICODONE) 5 MG immediate release tablet Take 1 tablet (5 mg total) by mouth every 6 (six) hours as needed for severe pain. 30 tablet 0  .  rosuvastatin (CRESTOR) 40 MG tablet Take 1 tablet (40 mg total) by mouth daily. 90 tablet 3   No current facility-administered medications for this visit.     ROS:  See HPI  Physical Exam:  Today's Vitals   12/09/19 0902  BP: (!) 144/82  Pulse: 88  Resp: 14  Temp: 98.1 F (36.7 C)  SpO2: 100%   There is no height or weight on file to calculate BMI.   Incision:  There is a small area of fibrinous exudate that was excised and the wound bed looks good and healing.  Staples in place the rest of the suture line.     Extremities:  Good ROM left AKA   Assessment/Plan:  This is a 64 y.o. female who is s/p:  left above knee amputation on 10/01/2019 by Dr. Darrick Penna.  She subsequently underwent debridement of left AKA and placement of vac dressing on 11/03/2019 also by Dr. Darrick Penna.   -pt doing  well and wound is healing-continue current dressing changes.  Discussed with pt that she may shower with soap and water daily.  Discussed that she should have someone present until she is comfortable.  -pt feels slight depression but has improved some.  She feels that she does not have an appetite due to this and has a 20lb weight loss.  Discussed that she should make an appt with Dr. Lawerance Bach at very least a phone visit to discuss these issues.  She is eager to get her appetite back.  Also discussed with her that I was happy she had acknowledged her amputation and feel this acknowledgement will also help with improving her depression.   -she is smoking some again.  Greater than 3 minutes spent discussing smoking cessation related to wound healing, circulation, cardiac issues and stroke.  She states that she quit in the past and feels she can do this again.  Discussed a goal date to quit and methods to help when she feels the urge to smoke.   -she will f/u in 3 weeks with PA on Dr. Darrick Penna clinic day to evaluate wound.  Once this has healed, she can be evaluated at [facility] for prosthesis.  She will call before then should she have any issues.     Doreatha Massed, [facility] [phone]  Clinic MD:  Chestine Spore

## 2019-12-11 DIAGNOSIS — B951 Streptococcus, group B, as the cause of diseases classified elsewhere: Secondary | ICD-10-CM | POA: Diagnosis not present

## 2019-12-11 DIAGNOSIS — B9689 Other specified bacterial agents as the cause of diseases classified elsewhere: Secondary | ICD-10-CM | POA: Diagnosis not present

## 2019-12-11 DIAGNOSIS — T8744 Infection of amputation stump, left lower extremity: Secondary | ICD-10-CM | POA: Diagnosis not present

## 2019-12-11 DIAGNOSIS — I70222 Atherosclerosis of native arteries of extremities with rest pain, left leg: Secondary | ICD-10-CM | POA: Diagnosis not present

## 2019-12-11 DIAGNOSIS — T8781 Dehiscence of amputation stump: Secondary | ICD-10-CM | POA: Diagnosis not present

## 2019-12-14 NOTE — Progress Notes (Signed)
Subjective:    Patient ID: Mercedes [name], female    DOB: [birth year], 64 y.o.   MRN: [id number]  HPI The patient is here for an acute visit to discuss mirtazapine. [name]  She was on it in the past and it worked well.    She is not sure if she is depressed or not.  She denies anxiety.  She does not sleep well.  She has a decreased appetite and has lost weight.  In order for her left AKA to heal she needs to get better nutrition and gain some weight.       Medications and allergies reviewed with patient and updated if appropriate.  Patient Active Problem List   Diagnosis Date Noted  . S/P above knee amputation, left (HCC) 11/03/2019  . Non-healing surgical wound 10/30/2019  . Stump pain (HCC) 10/21/2019  . S/P AKA (above knee amputation), left (HCC), 10/01/19 10/20/2019  . Femoral-popliteal bypass graft occlusion, left (HCC) 09/25/2019  . Peripheral arterial disease (HCC) 09/25/2019  . 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 Protein-calorie malnutrition, severe 05/15/2019  . Critical lower limb ischemia 05/09/2019  . Dyspnea 02/01/2019  . Weight loss, unintentional 02/01/2019  . Epigastric pain 02/01/2019  . 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 Acute renal failure superimposed on stage 2 chronic kidney disease (HCC) 10/20/2018  . Diabetes (HCC) 04/01/2017  . Hyperthyroidism 02/21/2017  . Low mean corpuscular volume (MCV) 09/30/2016  . Coronary artery disease involving native heart without angina pectoris   . Hx of CABG 07/17/2016  . Paroxysmal atrial fibrillation (HCC) 07/12/2016  . NSTEMI (non-ST elevated myocardial infarction) (HCC) 07/11/2016  . History of arterial bypass of lower extremity 12/30/2015  . Claudication of right lower extremity (HCC) 12/30/2015  . Phantom limb pain-Left great toe 04/29/2015  . Seizure (HCC)   . Atrial flutter with rapid ventricular response vs. SVT 10/27/2014  . Atherosclerosis of native artery of left lower  extremity with rest pain (HCC) 12/18/2013  . PAD (peripheral artery disease) (HCC) 12/02/2013  . SLE (systemic lupus erythematosus) (HCC)   . CAD - S/P Takotsubo MI in 2000, CFX DES Sept 2014 05/25/2013  . Acute MI, inferior wall (HCC) 05/19/2013  . 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 Tachycardia-bradycardia (HCC) 03/09/2010  . PVD- s/p Lt [facility] April 2015 10/29/2009  . Dyslipidemia 08/26/2009  . Essential hypertension 08/26/2009  . CORONARY ATHEROSCLEROSIS NATIVE CORONARY ARTERY 08/26/2009  . 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 Rheumatoid arthritis (HCC) 08/26/2009    Current Outpatient Medications on File Prior to Visit  Medication Sig Dispense Refill  . acetaminophen (TYLENOL) 500 MG tablet Take 500-1,000 mg by mouth every 8 (eight) hours as needed for mild pain or headache.    [name] ELIQUIS 5 MG TABS tablet TAKE 1 TABLET BY MOUTH TWICE DAILY (Patient taking differently: Take 5 mg by mouth 2 (two) times daily. ) 180 tablet 3  . Ensure Max Protein (ENSURE MAX PROTEIN) LIQD Take 8 oz by mouth 2 (two) times daily after a meal.     . metoprolol tartrate (LOPRESSOR) 25 MG tablet Take 0.5 tablets (12.5 mg total) by mouth 2 (two) times daily. 180 tablet 3  . Multiple Vitamin (MULTIVITAMIN WITH MINERALS) TABS tablet Take 1 tablet by mouth daily. (Patient taking differently: Take 1 tablet by mouth 3 (three) times a week. )    . nitroGLYCERIN (NITROSTAT) 0.4 MG SL tablet PLACE AND DISSOLVE 1 TABLET UNDER THE TONGUE EVERY 5 MINUTES AS NEEDED  FOR CHEST PAIN (Patient taking differently: Place 0.4 mg under the tongue every 5 (five) minutes as needed for chest pain. ) 25 tablet 3  . oxyCODONE (ROXICODONE) 5 MG immediate release tablet Take 1 tablet (5 mg total) by mouth every 6 (six) hours as needed for severe pain. 30 tablet 0  . rosuvastatin (CRESTOR) 40 MG tablet Take 1 tablet (40 mg total) by mouth daily. 90 tablet 3   No current facility-administered medications on file prior to visit.     Past Medical History:  Diagnosis Date  . Abnormal LFTs    a. in the past when on Lipitor  . Anemia    as a young woman  . Anxiety   . Arnold-Chiari malformation, type I ([place])    MRI brain 02/2010  . CAD (coronary artery disease)    a. NSTEMI 07/2009: LHC - D2 40%, LAD irreg., EF 50% with apical AK (tako-tsubo CM);  b. Inf STEMI (04/2013): Tx Promus DES to CFX;  c. 08/2013 [facility] CL: No ischemia, dist ant/ap/inf-ap infarct, EF  d. 06/2016 NSTEMI with LM disease: s/p CABG on 07/17/2016 w/ LIMA-LAD, SVG-OM, and SVG-dRCA.  [name] DEPRESSION   . Dizziness    ? CVA 01/2010 - carotid dopplers with no ICA stenosis  . GERD (gastroesophageal reflux disease)   . Headache   . HYPERLIPIDEMIA   . HYPERTENSION   . MI (myocardial infarction) ([place])   . PAD (peripheral artery disease) ([place])    s/p L fem pop 11/2013 in setting of 1st toe osteo/gangrene  . Paroxysmal atrial fibrillation (HCC)    a. anticoagulated with Eliquis 10/2014  . 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 RA (rheumatoid arthritis) ([place])    prior tx by Dr. Ouida Sills  . Sjogren's syndrome ([place]) 06/02/13   pt denies this (12/01/13)  . SLE (systemic lupus erythematosus) ([place]) dx 05/2013   follows with rheum Ouida Sills)  . Tachy-brady syndrome (HCC)    a. s/p STJ Leadless pacemaker 11-04-2014 Dr Rayann Heman  . 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 Wears dentures   . Wears glasses     Past Surgical History:  Procedure Laterality Date  . ABDOMINAL AORTAGRAM N/A 11/24/2013   Procedure: ABDOMINAL AORTAGRAM WITH BILATERAL RUNOFF WITH POSSIBLE INTERVENTION;  Surgeon: Angelia Mould, MD;  Location[facility] CATH LAB;  Service: Cardiovascular;  Laterality: N/A;  . ABDOMINAL AORTOGRAM W/LOWER EXTREMITY N/A 04/25/2019   Procedure: ABDOMINAL AORTOGRAM W/LOWER EXTREMITY;  Surgeon: Elam Dutch, MD;  Location: [name] CV LAB;  Service:  Cardiovascular;  Laterality: N/A;  . ABDOMINAL AORTOGRAM W/LOWER EXTREMITY Bilateral 05/15/2019   Procedure: ABDOMINAL AORTOGRAM W/LOWER EXTREMITY;  Surgeon: Serafina Mitchell, MD;  Location: [place] CV LAB;  Service: Cardiovascular;  Laterality: Bilateral;  . AMPUTATION Left 12/02/2013   Procedure: LEFT 1ST TOE AMPUTATION;  Surgeon: Elam Dutch, MD;  Location: [place];  Service: Vascular;  Laterality: Left;  . AMPUTATION Left 07/16/2019   Procedure: LEFT TRANSMETATARSAL AMPUTATION;  Surgeon: Newt Minion, MD;  Location: [place];  Service: Orthopedics;  Laterality: Left;  . AMPUTATION Left 10/01/2019   Procedure: LEFT AMPUTATION ABOVE KNEE;  Surgeon: Elam Dutch, MD;  Location: [place];  Service: Vascular;  Laterality: Left;  . APPLICATION OF WOUND VAC Left 11/03/2019   Procedure: Application Of Wound Vac, left lower extremity;  Surgeon: Elam Dutch, MD;  Location: [place];  Service: Vascular;  Laterality: Left;  . CARDIAC CATHETERIZATION N/A 07/12/2016   Procedure: Left Heart Cath and Coronary Angiography;  Surgeon: Lennette Bihari, MD;  Location: [facility] INVASIVE CV LAB;  Service: Cardiovascular;  Laterality: N/A;  . CARDIAC CATHETERIZATION N/A 07/14/2016   Procedure: Intravascular Ultrasound/IVUS;  Surgeon: Yvonne Kendall, MD;  Location: [facility] INVASIVE CV LAB;  Service: Cardiovascular;  Laterality: N/A;  . COLONOSCOPY    . CORONARY ANGIOPLASTY  04/2013  . CORONARY ARTERY BYPASS GRAFT N/A 07/17/2016   Procedure: CORONARY ARTERY BYPASS GRAFTING (CABG)x3 using right greater saphenous vein harvested endoscopiclly and left internal mammary artery;  Surgeon: Kerin Perna, MD;  Location: [facility] OR;  Service: Open Heart Surgery;  Laterality: N/A;  . ENDARTERECTOMY FEMORAL Left 05/12/2019   Procedure: LEFT Femoral Endarterectomy;  Surgeon: Sherren Kerns, MD;  Location: [facility] OR;  Service: Vascular;  Laterality: Left;  . FEMORAL-POPLITEAL BYPASS GRAFT Left 12/02/2013   Procedure:  LEFT FEMORAL-BELOW KNEE  POPLITEAL ARTERY BYPASS GRAFT;  Surgeon: Sherren Kerns, MD;  Location: [facility] OR;  Service: Vascular;  Laterality: Left;  . FEMORAL-POPLITEAL BYPASS GRAFT Left 09/25/2019   Procedure: THROMBECTOMY and PROPATEN BYPASS  FEMORAL TO BELOW KNEE POPLITEAL ARTERY BYPASS GRAFT LEFT LEG;  Surgeon: Larina Earthly, MD;  Location: [facility] OR;  Service: Vascular;  Laterality: Left;  . LEFT HEART CATHETERIZATION WITH CORONARY ANGIOGRAM N/A 05/19/2013   Procedure: LEFT HEART CATHETERIZATION WITH CORONARY ANGIOGRAM;  Surgeon: Micheline Chapman, MD;  Location: [name] CATH LAB;  Service: Cardiovascular;  Laterality: N/A;  . LEFT HEART CATHETERIZATION WITH CORONARY ANGIOGRAM N/A 10/28/2014   Procedure: LEFT HEART CATHETERIZATION WITH CORONARY ANGIOGRAM;  Surgeon: Iran Ouch, MD;  Location: [facility] CATH LAB;  Service: Cardiovascular;  Laterality: N/A;  . LOWER EXTREMITY ANGIOGRAM Left 05/12/2019   Procedure: Lower Extremity Angiogram;  Surgeon: Sherren Kerns, MD;  Location: [facility] OR;  Service: Vascular;  Laterality: Left;  . PATCH ANGIOPLASTY Left 05/12/2019   Procedure: Left Femoral Patch Angioplasty USING CADAVERIC SAPHENOUS VEIN;  Surgeon: Sherren Kerns, MD;  Location: [facility] OR;  Service: Vascular;  Laterality: Left;  . PERCUTANEOUS CORONARY STENT INTERVENTION (PCI-S)  05/19/2013   Procedure: PERCUTANEOUS CORONARY STENT INTERVENTION (PCI-S);  Surgeon: Micheline Chapman, MD;  Location: [facility] CATH LAB;  Service: Cardiovascular;;  . PERMANENT PACEMAKER INSERTION N/A 11/04/2014   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  . TEE WITHOUT CARDIOVERSION N/A 07/17/2016   Procedure: TRANSESOPHAGEAL ECHOCARDIOGRAM (TEE);  Surgeon: Kerin Perna, MD;  Location: [facility] OR;  Service: Open Heart Surgery;  Laterality: N/A;  . THROMBECTOMY FEMORAL ARTERY Left 05/12/2019   Procedure: Left Ilio-Femoral Artery and Femoral-popliteal Graft Thrombectomy;  Surgeon: Sherren Kerns, MD;  Location: [facility] OR;  Service: Vascular;  Laterality: Left;   . TUBAL LIGATION    . VISCERAL ANGIOGRAPHY N/A 04/25/2019   Procedure: MESENTRIC  ANGIOGRAPHY;  Surgeon: Sherren Kerns, MD;  Location: [facility] INVASIVE CV LAB;  Service: Cardiovascular;  Laterality: N/A;  . WOUND DEBRIDEMENT Left 11/03/2019   Procedure: Debridement Wound of left above the knee amputation;  Surgeon: Sherren Kerns, MD;  Location: [facility] OR;  Service: Vascular;  Laterality: Left;    Social History   Socioeconomic History  . Marital status: Single    Spouse name: Not on file  . Number of children: 3  . Years of education: Not on file  . Highest education level: Not on file  Occupational History  . Occupation: retired  Tobacco Use  .  Smoking status: Former Smoker    Types: Cigarettes    Quit date: 2019    Years since quitting: 2.2  . Smokeless tobacco: Never Used  . Tobacco comment:    Substance and Sexual Activity  . Alcohol use: No    Alcohol/week: 0.0 standard drinks  . Drug use: Not Currently    Types: Marijuana, Other-see comments    Comment:  and CBD oil  last use of both was 10/29/19  . Sexual activity: Not on file  Other Topics Concern  . Not on file  Social History Narrative   Lives alone.   Social Determinants of Health   Financial Resource Strain:   . Difficulty of Paying Living Expenses:   Food Insecurity:   . Worried About Programme researcher, broadcasting/film/video in the Last Year:   . Barista in the Last Year:   Transportation Needs:   . Freight forwarder (Medical):   [name] Lack of Transportation (Non-Medical):   Physical Activity:   . Days of Exercise per Week:   . Minutes of Exercise per Session:   Stress:   . Feeling of Stress :   Social Connections:   . Frequency of Communication with Friends and Family:   . Frequency of Social Gatherings with Friends and Family:   . Attends Religious Services:   . Active Member of Clubs or Organizations:   . Attends Banker Meetings:   [name] Marital Status:     Family History  Problem Relation Age of  Onset  . Arthritis Mother   . Emphysema Mother   . Arthritis Father   . COPD Father   . Heart disease Father   . Diabetes Paternal Aunt        paternal Aunts  . Heart disease Paternal Aunt   . Thyroid disease Neg Hx     Review of Systems  Constitutional: Positive for appetite change (decreased) and fatigue.  Respiratory: Negative for shortness of breath.   Cardiovascular: Negative for chest pain.  Gastrointestinal: Negative for abdominal pain and nausea.       No gerd  Neurological: Negative for light-headedness and headaches.  Psychiatric/Behavioral: Positive for dysphoric mood (? depression) and sleep disturbance (wakes frequently). The patient is not nervous/anxious.        Objective:   Vitals:   12/15/19 1426  BP: 138/78  Pulse: 65  Resp: 16  Temp: 99.2 F (37.3 C)  SpO2: 99%   BP Readings from Last 3 Encounters:  12/15/19 138/78  12/09/19 (!) 144/82  11/24/19 (!) 158/78   Wt Readings from Last 3 Encounters:  11/18/19 95 lb 4.8 oz (43.2 kg)  11/03/19 105 lb (47.6 kg)  10/30/19 106 lb (48.1 kg)   Body mass index is 15.38 kg/m.   Physical Exam    Constitutional: Appears well-developed and well-nourished. No distress.  Head: Normocephalic and atraumatic.  Cardiovascular: Normal rate, regular rhythm and normal heart sounds.  No murmur heard. No carotid bruit .  No edema Pulmonary/Chest: Effort normal and breath sounds normal. No respiratory distress. No has no wheezes. No rales.  Msk:  S/p left AKA Skin: Skin is warm and dry. Not diaphoretic.  Psychiatric: Normal mood and affect. Behavior is normal.       Assessment & Plan:    See Problem List for Assessment and Plan of chronic medical problems.    This visit occurred during the SARS-CoV-2 public health emergency.  Safety protocols were in place, including screening questions prior  to the visit, additional usage of staff PPE, and extensive cleaning of exam room while observing appropriate contact  time as indicated for disinfecting solutions.

## 2019-12-15 ENCOUNTER — Other Ambulatory Visit: Payer: Self-pay

## 2019-12-15 ENCOUNTER — Ambulatory Visit (INDEPENDENT_AMBULATORY_CARE_PROVIDER_SITE_OTHER): Payer: Medicare Other | Admitting: Internal Medicine

## 2019-12-15 ENCOUNTER — Encounter: Payer: Self-pay | Admitting: Internal Medicine

## 2019-12-15 VITALS — BP 138/78 | HR 65 | Temp 99.2°F | Resp 16 | Ht 66.0 in

## 2019-12-15 DIAGNOSIS — I70222 Atherosclerosis of native arteries of extremities with rest pain, left leg: Secondary | ICD-10-CM | POA: Diagnosis not present

## 2019-12-15 DIAGNOSIS — T8781 Dehiscence of amputation stump: Secondary | ICD-10-CM | POA: Diagnosis not present

## 2019-12-15 DIAGNOSIS — F329 Major depressive disorder, single episode, unspecified: Secondary | ICD-10-CM | POA: Insufficient documentation

## 2019-12-15 DIAGNOSIS — R634 Abnormal weight loss: Secondary | ICD-10-CM

## 2019-12-15 DIAGNOSIS — F32A Depression, unspecified: Secondary | ICD-10-CM | POA: Insufficient documentation

## 2019-12-15 DIAGNOSIS — F3289 Other specified depressive episodes: Secondary | ICD-10-CM | POA: Diagnosis not present

## 2019-12-15 DIAGNOSIS — T8744 Infection of amputation stump, left lower extremity: Secondary | ICD-10-CM | POA: Diagnosis not present

## 2019-12-15 DIAGNOSIS — B9689 Other specified bacterial agents as the cause of diseases classified elsewhere: Secondary | ICD-10-CM | POA: Diagnosis not present

## 2019-12-15 DIAGNOSIS — B951 Streptococcus, group B, as the cause of diseases classified elsewhere: Secondary | ICD-10-CM | POA: Diagnosis not present

## 2019-12-15 MED ORDER — MIRTAZAPINE 15 MG PO TABS: 15.0000 mg | ORAL_TABLET | Freq: Every day | ORAL | 5 refills | Status: DC

## 2019-12-15 NOTE — Assessment & Plan Note (Signed)
Acute Probable mild depression associated with some poor sleep and decreased appetite leading to weight loss No anxiety Adjusting to AKA, overall doing well Start remeron 15 mg daily

## 2019-12-15 NOTE — Patient Instructions (Signed)
  Medications reviewed and updated.  Changes include :   mirtazapine 15 mg nightly Your prescription(s) have been submitted to your pharmacy. Please take as directed and contact our office if you believe you are having problem(s) with the medication(s).   Update me how you are doing via [employer]

## 2019-12-15 NOTE — Assessment & Plan Note (Signed)
Acute on chronic Has lost more weight decreased appetite Wants to gain weight Will try remeron 15 mg HS which has helped in the past Discussed the importance of good nutrition Add MVI Continue ensure

## 2019-12-18 DIAGNOSIS — B951 Streptococcus, group B, as the cause of diseases classified elsewhere: Secondary | ICD-10-CM | POA: Diagnosis not present

## 2019-12-18 DIAGNOSIS — I70222 Atherosclerosis of native arteries of extremities with rest pain, left leg: Secondary | ICD-10-CM | POA: Diagnosis not present

## 2019-12-18 DIAGNOSIS — B9689 Other specified bacterial agents as the cause of diseases classified elsewhere: Secondary | ICD-10-CM | POA: Diagnosis not present

## 2019-12-18 DIAGNOSIS — T8781 Dehiscence of amputation stump: Secondary | ICD-10-CM | POA: Diagnosis not present

## 2019-12-18 DIAGNOSIS — T8744 Infection of amputation stump, left lower extremity: Secondary | ICD-10-CM | POA: Diagnosis not present

## 2019-12-22 DIAGNOSIS — T8781 Dehiscence of amputation stump: Secondary | ICD-10-CM | POA: Diagnosis not present

## 2019-12-22 DIAGNOSIS — T8744 Infection of amputation stump, left lower extremity: Secondary | ICD-10-CM | POA: Diagnosis not present

## 2019-12-22 DIAGNOSIS — B951 Streptococcus, group B, as the cause of diseases classified elsewhere: Secondary | ICD-10-CM | POA: Diagnosis not present

## 2019-12-22 DIAGNOSIS — B9689 Other specified bacterial agents as the cause of diseases classified elsewhere: Secondary | ICD-10-CM | POA: Diagnosis not present

## 2019-12-22 DIAGNOSIS — I70222 Atherosclerosis of native arteries of extremities with rest pain, left leg: Secondary | ICD-10-CM | POA: Diagnosis not present

## 2019-12-25 DIAGNOSIS — T8781 Dehiscence of amputation stump: Secondary | ICD-10-CM | POA: Diagnosis not present

## 2019-12-25 DIAGNOSIS — I70222 Atherosclerosis of native arteries of extremities with rest pain, left leg: Secondary | ICD-10-CM | POA: Diagnosis not present

## 2019-12-25 DIAGNOSIS — B951 Streptococcus, group B, as the cause of diseases classified elsewhere: Secondary | ICD-10-CM | POA: Diagnosis not present

## 2019-12-25 DIAGNOSIS — B9689 Other specified bacterial agents as the cause of diseases classified elsewhere: Secondary | ICD-10-CM | POA: Diagnosis not present

## 2019-12-25 DIAGNOSIS — T8744 Infection of amputation stump, left lower extremity: Secondary | ICD-10-CM | POA: Diagnosis not present

## 2019-12-29 DIAGNOSIS — B9689 Other specified bacterial agents as the cause of diseases classified elsewhere: Secondary | ICD-10-CM | POA: Diagnosis not present

## 2019-12-29 DIAGNOSIS — T8781 Dehiscence of amputation stump: Secondary | ICD-10-CM | POA: Diagnosis not present

## 2019-12-29 DIAGNOSIS — I70222 Atherosclerosis of native arteries of extremities with rest pain, left leg: Secondary | ICD-10-CM | POA: Diagnosis not present

## 2019-12-29 DIAGNOSIS — T8744 Infection of amputation stump, left lower extremity: Secondary | ICD-10-CM | POA: Diagnosis not present

## 2019-12-29 DIAGNOSIS — B951 Streptococcus, group B, as the cause of diseases classified elsewhere: Secondary | ICD-10-CM | POA: Diagnosis not present

## 2020-01-01 DIAGNOSIS — T8744 Infection of amputation stump, left lower extremity: Secondary | ICD-10-CM | POA: Diagnosis not present

## 2020-01-01 DIAGNOSIS — I70222 Atherosclerosis of native arteries of extremities with rest pain, left leg: Secondary | ICD-10-CM | POA: Diagnosis not present

## 2020-01-01 DIAGNOSIS — B951 Streptococcus, group B, as the cause of diseases classified elsewhere: Secondary | ICD-10-CM | POA: Diagnosis not present

## 2020-01-01 DIAGNOSIS — B9689 Other specified bacterial agents as the cause of diseases classified elsewhere: Secondary | ICD-10-CM | POA: Diagnosis not present

## 2020-01-01 DIAGNOSIS — T8781 Dehiscence of amputation stump: Secondary | ICD-10-CM | POA: Diagnosis not present

## 2020-01-05 DIAGNOSIS — B951 Streptococcus, group B, as the cause of diseases classified elsewhere: Secondary | ICD-10-CM | POA: Diagnosis not present

## 2020-01-05 DIAGNOSIS — I70222 Atherosclerosis of native arteries of extremities with rest pain, left leg: Secondary | ICD-10-CM | POA: Diagnosis not present

## 2020-01-05 DIAGNOSIS — T8781 Dehiscence of amputation stump: Secondary | ICD-10-CM | POA: Diagnosis not present

## 2020-01-05 DIAGNOSIS — T8744 Infection of amputation stump, left lower extremity: Secondary | ICD-10-CM | POA: Diagnosis not present

## 2020-01-05 DIAGNOSIS — B9689 Other specified bacterial agents as the cause of diseases classified elsewhere: Secondary | ICD-10-CM | POA: Diagnosis not present

## 2020-01-08 ENCOUNTER — Ambulatory Visit: Payer: Medicare Other

## 2020-01-08 DIAGNOSIS — T8781 Dehiscence of amputation stump: Secondary | ICD-10-CM | POA: Diagnosis not present

## 2020-01-08 DIAGNOSIS — I70222 Atherosclerosis of native arteries of extremities with rest pain, left leg: Secondary | ICD-10-CM | POA: Diagnosis not present

## 2020-01-08 DIAGNOSIS — B9689 Other specified bacterial agents as the cause of diseases classified elsewhere: Secondary | ICD-10-CM | POA: Diagnosis not present

## 2020-01-08 DIAGNOSIS — B951 Streptococcus, group B, as the cause of diseases classified elsewhere: Secondary | ICD-10-CM | POA: Diagnosis not present

## 2020-01-08 DIAGNOSIS — T8744 Infection of amputation stump, left lower extremity: Secondary | ICD-10-CM | POA: Diagnosis not present

## 2020-01-13 DIAGNOSIS — I70222 Atherosclerosis of native arteries of extremities with rest pain, left leg: Secondary | ICD-10-CM | POA: Diagnosis not present

## 2020-01-13 DIAGNOSIS — T8744 Infection of amputation stump, left lower extremity: Secondary | ICD-10-CM | POA: Diagnosis not present

## 2020-01-13 DIAGNOSIS — M79662 Pain in left lower leg: Secondary | ICD-10-CM | POA: Diagnosis not present

## 2020-01-13 DIAGNOSIS — B9689 Other specified bacterial agents as the cause of diseases classified elsewhere: Secondary | ICD-10-CM | POA: Diagnosis not present

## 2020-01-13 DIAGNOSIS — T8781 Dehiscence of amputation stump: Secondary | ICD-10-CM | POA: Diagnosis not present

## 2020-01-13 DIAGNOSIS — B951 Streptococcus, group B, as the cause of diseases classified elsewhere: Secondary | ICD-10-CM | POA: Diagnosis not present

## 2020-01-15 ENCOUNTER — Other Ambulatory Visit: Payer: Self-pay

## 2020-01-15 ENCOUNTER — Ambulatory Visit (INDEPENDENT_AMBULATORY_CARE_PROVIDER_SITE_OTHER): Payer: Self-pay | Admitting: Physician Assistant

## 2020-01-15 VITALS — BP 172/96 | HR 86 | Temp 97.6°F | Resp 16 | Ht 66.0 in | Wt 95.0 lb

## 2020-01-15 DIAGNOSIS — Z89612 Acquired absence of left leg above knee: Secondary | ICD-10-CM

## 2020-01-15 NOTE — Progress Notes (Addendum)
POST OPERATIVE OFFICE NOTE    CC:  F/u for surgery  HPI:  This is a 64 y.o. female who is s/p left above knee amputation on 10/01/2019 by Dr. Darrick Penna.  She subsequently underwent debridement of left AKA and placement of vac dressing on 11/03/2019 also by Dr. Darrick Penna.  She was seen on 11/24/2019 in the hospital and at that time, the wound had some fibrinous exudate but no drainage or erythema.  She was discharged home with [facility] for wound care with hydrogel with wet to dry dressings and Augmentin for 7 days   She was seen 12/09/2019 & at that time, she had stated that her wound was getting better and wound care was continued.  She was also instructed that she could shower.  She was feeling somewhat depressed and had weight loss.  She did follow up with Dr. Lawerance Bach and she was started on remeron since it had worked for her in the past.  I had discussed with pt nutrition for healing as did Dr. Lawerance Bach.    Pt returns today for a wound check.  She states that the wound continues to heal.  She states that her appetite has significantly improved but she still cannot gain weight.  She states that she has been increasing her activity and is getting around pretty well with a walker.  She states that she drove herself here today.  She is feeling much better and her mood/affect is significantly better than four weeks ago.     Allergies  Allergen Reactions  . Brilinta [Ticagrelor] Anaphylaxis and Other (See Comments)    Also, chest tightness  . Latex Rash  . Tape Rash    Current Outpatient Medications  Medication Sig Dispense Refill  . acetaminophen (TYLENOL) 500 MG tablet Take 500-1,000 mg by mouth every 8 (eight) hours as needed for mild pain or headache.    [name] ELIQUIS 5 MG TABS tablet TAKE 1 TABLET BY MOUTH TWICE DAILY (Patient taking differently: Take 5 mg by mouth 2 (two) times daily. ) 180 tablet 3  . Ensure Max Protein (ENSURE MAX PROTEIN) LIQD Take 8 oz by mouth 2 (two) times daily after a meal.     .  metoprolol tartrate (LOPRESSOR) 25 MG tablet Take 0.5 tablets (12.5 mg total) by mouth 2 (two) times daily. 180 tablet 3  . mirtazapine (REMERON) 15 MG tablet Take 1 tablet (15 mg total) by mouth at bedtime. 30 tablet 5  . Multiple Vitamin (MULTIVITAMIN WITH MINERALS) TABS tablet Take 1 tablet by mouth daily. (Patient taking differently: Take 1 tablet by mouth 3 (three) times a week. )    . nitroGLYCERIN (NITROSTAT) 0.4 MG SL tablet PLACE AND DISSOLVE 1 TABLET UNDER THE TONGUE EVERY 5 MINUTES AS NEEDED FOR CHEST PAIN (Patient taking differently: Place 0.4 mg under the tongue every 5 (five) minutes as needed for chest pain. ) 25 tablet 3  . oxyCODONE (ROXICODONE) 5 MG immediate release tablet Take 1 tablet (5 mg total) by mouth every 6 (six) hours as needed for severe pain. 30 tablet 0  . rosuvastatin (CRESTOR) 40 MG tablet Take 1 tablet (40 mg total) by mouth daily. 90 tablet 3   No current facility-administered medications for this visit.     ROS:  See HPI  Physical Exam:  Today's Vitals   01/15/20 1057  BP: (!) 172/96  Pulse: 86  Resp: 16  Temp: 97.6 F (36.4 C)  TempSrc: Temporal  SpO2: 100%  Weight: 95 lb (43.1 kg)  Height: 5\' 6"  (1.676 m)   Body mass index is 15.33 kg/m.   Incision:  Healing nicely.  Non healing wound much smaller      Assessment/Plan:  This is a 64 y.o. female who is s/p: left above knee amputation on 10/01/2019 by Dr. Oneida Alar.  She subsequently underwent debridement of left AKA and placement of vac dressing on 11/03/2019 also by Dr. Oneida Alar.   -wound continues to heal.  Continue current wound care. -pt states her appetite has improved significantly, but she cannot gain any weight.  Discussed with her to continue increasing her intake/nutrition to help with healing process.   -She states that she is feeling much better and feeling much less depressed.   Her mood/affect today is significantly improved from her last visit with me a month ago.  Encouraged her to  continue making strides. -we will see her back in 4 weeks and her wound should be healed by that point and hopefully we can get her set up at [facility] to start the process for a prosthesis.   -will resume HHPT as this stopped after she had a wound vac placed previously.    Leontine Locket, [facility] [phone]  Clinic MD:  Oneida Alar

## 2020-01-23 DIAGNOSIS — I70222 Atherosclerosis of native arteries of extremities with rest pain, left leg: Secondary | ICD-10-CM | POA: Diagnosis not present

## 2020-01-23 DIAGNOSIS — T8781 Dehiscence of amputation stump: Secondary | ICD-10-CM | POA: Diagnosis not present

## 2020-01-23 DIAGNOSIS — B951 Streptococcus, group B, as the cause of diseases classified elsewhere: Secondary | ICD-10-CM | POA: Diagnosis not present

## 2020-01-23 DIAGNOSIS — B9689 Other specified bacterial agents as the cause of diseases classified elsewhere: Secondary | ICD-10-CM | POA: Diagnosis not present

## 2020-01-23 DIAGNOSIS — T8744 Infection of amputation stump, left lower extremity: Secondary | ICD-10-CM | POA: Diagnosis not present

## 2020-01-27 DIAGNOSIS — B9689 Other specified bacterial agents as the cause of diseases classified elsewhere: Secondary | ICD-10-CM | POA: Diagnosis not present

## 2020-01-27 DIAGNOSIS — B951 Streptococcus, group B, as the cause of diseases classified elsewhere: Secondary | ICD-10-CM | POA: Diagnosis not present

## 2020-01-27 DIAGNOSIS — T8781 Dehiscence of amputation stump: Secondary | ICD-10-CM | POA: Diagnosis not present

## 2020-01-27 DIAGNOSIS — T8744 Infection of amputation stump, left lower extremity: Secondary | ICD-10-CM | POA: Diagnosis not present

## 2020-01-27 DIAGNOSIS — I70222 Atherosclerosis of native arteries of extremities with rest pain, left leg: Secondary | ICD-10-CM | POA: Diagnosis not present

## 2020-02-03 DIAGNOSIS — B9689 Other specified bacterial agents as the cause of diseases classified elsewhere: Secondary | ICD-10-CM | POA: Diagnosis not present

## 2020-02-03 DIAGNOSIS — I70222 Atherosclerosis of native arteries of extremities with rest pain, left leg: Secondary | ICD-10-CM | POA: Diagnosis not present

## 2020-02-03 DIAGNOSIS — T8744 Infection of amputation stump, left lower extremity: Secondary | ICD-10-CM | POA: Diagnosis not present

## 2020-02-03 DIAGNOSIS — T8781 Dehiscence of amputation stump: Secondary | ICD-10-CM | POA: Diagnosis not present

## 2020-02-03 DIAGNOSIS — B951 Streptococcus, group B, as the cause of diseases classified elsewhere: Secondary | ICD-10-CM | POA: Diagnosis not present

## 2020-02-12 ENCOUNTER — Ambulatory Visit: Payer: Medicare Other

## 2020-02-12 NOTE — Progress Notes (Deleted)
  POST OPERATIVE OFFICE NOTE    CC:  F/u for surgery  HPI:  This is a 64 y.o. female who is s/p left above knee amputation on 10/01/2019 by Dr. Darrick Penna. She subsequently underwent debridement of left AKA and placement of vac dressing on 11/03/2019 also by Dr. Darrick Penna.  She was seen 1 month ago with evidence of continued healing of her residual limb incision.  Allergies  Allergen Reactions  . Brilinta [Ticagrelor] Anaphylaxis and Other (See Comments)    Also, chest tightness  . Latex Rash  . Tape Rash    Current Outpatient Medications  Medication Sig Dispense Refill  . acetaminophen (TYLENOL) 500 MG tablet Take 500-1,000 mg by mouth every 8 (eight) hours as needed for mild pain or headache.    [name] ELIQUIS 5 MG TABS tablet TAKE 1 TABLET BY MOUTH TWICE DAILY (Patient taking differently: Take 5 mg by mouth 2 (two) times daily. ) 180 tablet 3  . Ensure Max Protein (ENSURE MAX PROTEIN) LIQD Take 8 oz by mouth 2 (two) times daily after a meal.     . metoprolol tartrate (LOPRESSOR) 25 MG tablet Take 0.5 tablets (12.5 mg total) by mouth 2 (two) times daily. 180 tablet 3  . mirtazapine (REMERON) 15 MG tablet Take 1 tablet (15 mg total) by mouth at bedtime. 30 tablet 5  . Multiple Vitamin (MULTIVITAMIN WITH MINERALS) TABS tablet Take 1 tablet by mouth daily. (Patient taking differently: Take 1 tablet by mouth 3 (three) times a week. )    . nitroGLYCERIN (NITROSTAT) 0.4 MG SL tablet PLACE AND DISSOLVE 1 TABLET UNDER THE TONGUE EVERY 5 MINUTES AS NEEDED FOR CHEST PAIN (Patient taking differently: Place 0.4 mg under the tongue every 5 (five) minutes as needed for chest pain. ) 25 tablet 3  . oxyCODONE (ROXICODONE) 5 MG immediate release tablet Take 1 tablet (5 mg total) by mouth every 6 (six) hours as needed for severe pain. 30 tablet 0  . rosuvastatin (CRESTOR) 40 MG tablet Take 1 tablet (40 mg total) by mouth daily. 90 tablet 3   No current facility-administered medications for this visit.     ROS:  See  HPI  There were no vitals taken for this visit.  Physical Exam:  General appearance:*** Cardiac:*** Respiratory:*** Incision:  *** Extremities:  *** Neuro: *** Abdomen:  ***  May 15, 2019 arteriogram: Right Lower Extremity: The right common femoral and profundofemoral artery are small in caliber but patent without stenosis.  Superficial femoral artery is occluded with reconstitution of the above-knee popliteal artery at the adductor canal.  There is single-vessel runoff via the peroneal artery which reconstitutes a dorsalis pedis across the ankle.  September 26, 2019 ABI/TBIToday's ABIToday's TBIPrevious ABIPrevious TBI  +-------+-----------+-----------+------------+------------+  Right 0.28    0     0.46    not obtained  +-------+-----------+-----------+------------+------------+  Left  absent   amputated 0.24    amputated    +-------+-----------+-----------+------------+-   Assessment/Plan:  This is a 64 y.o. female who is s/p: left AKA with subsequent debridement in the OR.   Wendi Maya, PA-C [facility] [phone]  Clinic MD:  Edilia Bo

## 2020-02-13 DIAGNOSIS — M79662 Pain in left lower leg: Secondary | ICD-10-CM | POA: Diagnosis not present

## 2020-02-18 ENCOUNTER — Other Ambulatory Visit: Payer: Self-pay

## 2020-02-18 ENCOUNTER — Ambulatory Visit (INDEPENDENT_AMBULATORY_CARE_PROVIDER_SITE_OTHER): Payer: Medicare Other | Admitting: Physician Assistant

## 2020-02-18 VITALS — BP 163/94 | HR 89 | Temp 97.2°F | Resp 20 | Ht 66.0 in

## 2020-02-18 DIAGNOSIS — Z89612 Acquired absence of left leg above knee: Secondary | ICD-10-CM

## 2020-02-18 NOTE — Progress Notes (Signed)
HISTORY AND PHYSICAL     CC:  follow up Requesting Provider:  Pincus Sanes, MD  HPI: Mercedes [name] is a 64 y.o. (Nov 25, 1955) female who presents today for follow up for a non healing wound.  She is s/p left above knee amputation on 10/01/2019 by Dr. Darrick Penna. She subsequently underwent debridement of left AKA and placement of vac dressing on 11/03/2019 also by Dr. Darrick Penna. She was seen on 11/24/2019 in the hospital and at that time, the wound had some fibrinous exudate but no drainage or erythema. She was discharged home with [facility] for wound care with hydrogel with wet to dry dressings and Augmentin for 7 days   She was seen 12/09/2019 & at that time, she had stated that her wound was getting better and wound care was continued.  She was also instructed that she could shower.  She was feeling somewhat depressed and had weight loss.  She did follow up with Dr. Lawerance Bach and she was started on remeron since it had worked for her in the past.  I had discussed with pt nutrition for healing as did Dr. Lawerance Bach.    She was seen on 01/15/2020 and at that time, her wound was continuing to heal.  Her appetitie had improved but she still was not gaining weight.  She had been increasing her activity and getting around pretty good.  She comes in today for her follow up wound check.  She states that she continues to keep her stump clean with soap and water daily.  Her HH RN has stopped coming out bc her wounds are close to being healed.  She is anxious to get started on her prosthesis.  She states she is working on her diet for nutrition.  She states that it is hard as food is not that fresh.  She does drink her Ensure to help with nutrition.   The pt is on a statin for cholesterol management.  The pt is not on a daily aspirin.   Other AC:  Eliquis The pt is on BB for hypertension.   The pt is not diabetic.   Tobacco hx:  former  .  Past Medical History:  Diagnosis Date  . Abnormal LFTs    a. in the past when  on Lipitor  . Anemia    as a young woman  . Anxiety   . Arnold-Chiari malformation, type I (HCC)    MRI brain 02/2010  . CAD (coronary artery disease)    a. NSTEMI 07/2009: LHC - D2 40%, LAD irreg., EF 50% with apical AK (tako-tsubo CM);  b. Inf STEMI (04/2013): Tx Promus DES to CFX;  c. 08/2013 Lexi CL: No ischemia, dist ant/ap/inf-ap infarct, EF  d. 06/2016 NSTEMI with LM disease: s/p CABG on 07/17/2016 w/ LIMA-LAD, SVG-OM, and SVG-dRCA.  [name] DEPRESSION   . Dizziness    ? CVA 01/2010 - carotid dopplers with no ICA stenosis  . GERD (gastroesophageal reflux disease)   . Headache   . 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 HYPERLIPIDEMIA   . HYPERTENSION   . MI (myocardial infarction) (HCC)   . PAD (peripheral artery disease) (HCC)    s/p L fem pop 11/2013 in setting of 1st toe osteo/gangrene  . Paroxysmal atrial fibrillation (HCC)    a. anticoagulated with Eliquis 10/2014  . 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 RA (rheumatoid arthritis) (HCC)    prior tx by Dr.  Anderson  . Sjogren's syndrome (HCC) 06/02/13   pt denies this (12/01/13)  . SLE (systemic lupus erythematosus) (HCC) dx 05/2013   follows with rheum Dareen Piano)  . Tachy-brady syndrome (HCC)    a. s/p STJ Leadless pacemaker 11-04-2014 Dr Johney Frame  . 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 Wears dentures   . Wears glasses     Past Surgical History:  Procedure Laterality Date  . ABDOMINAL AORTAGRAM N/A 11/24/2013   Procedure: ABDOMINAL AORTAGRAM WITH BILATERAL RUNOFF WITH POSSIBLE INTERVENTION;  Surgeon: Chuck Hint, MD;  Location: [name] C[facility] LAB;  Service: Cardiovascular;  Laterality: N/A;  . ABDOMINAL AORTOGRAM W/LOWER EXTREMITY N/A 04/25/2019   Procedure: ABDOMINAL AORTOGRAM W/LOWER EXTREMITY;  Surgeon: Sherren Kerns, MD;  Location: [facility] INVASIVE CV LAB;  Service: Cardiovascular;   Laterality: N/A;  . ABDOMINAL AORTOGRAM W/LOWER EXTREMITY Bilateral 05/15/2019   Procedure: ABDOMINAL AORTOGRAM W/LOWER EXTREMITY;  Surgeon: Nada Libman, MD;  Location: [facility] INVASIVE CV LAB;  Service: Cardiovascular;  Laterality: Bilateral;  . AMPUTATION Left 12/02/2013   Procedure: LEFT 1ST TOE AMPUTATION;  Surgeon: Sherren Kerns, MD;  Location: [facility] OR;  Service: Vascular;  Laterality: Left;  . AMPUTATION Left 07/16/2019   Procedure: LEFT TRANSMETATARSAL AMPUTATION;  Surgeon: Nadara Mustard, MD;  Location: [facility] OR;  Service: Orthopedics;  Laterality: Left;  . AMPUTATION Left 10/01/2019   Procedure: LEFT AMPUTATION ABOVE KNEE;  Surgeon: Sherren Kerns, MD;  Location: [facility] OR;  Service: Vascular;  Laterality: Left;  . APPLICATION OF WOUND VAC Left 11/03/2019   Procedure: Application Of Wound Vac, left lower extremity;  Surgeon: Sherren Kerns, MD;  Location: [facility] OR;  Service: Vascular;  Laterality: Left;  . CARDIAC CATHETERIZATION N/A 07/12/2016   Procedure: Left Heart Cath and Coronary Angiography;  Surgeon: Lennette Bihari, MD;  Location: [facility] INVASIVE CV LAB;  Service: Cardiovascular;  Laterality: N/A;  . CARDIAC CATHETERIZATION N/A 07/14/2016   Procedure: Intravascular Ultrasound/IVUS;  Surgeon: Yvonne Kendall, MD;  Location: [facility] INVASIVE CV LAB;  Service: Cardiovascular;  Laterality: N/A;  . COLONOSCOPY    . CORONARY ANGIOPLASTY  04/2013  . CORONARY ARTERY BYPASS GRAFT N/A 07/17/2016   Procedure: CORONARY ARTERY BYPASS GRAFTING (CABG)x3 using right greater saphenous vein harvested endoscopiclly and left internal mammary artery;  Surgeon: Kerin Perna, MD;  Location: [facility] OR;  Service: Open Heart Surgery;  Laterality: N/A;  . ENDARTERECTOMY FEMORAL Left 05/12/2019   Procedure: LEFT Femoral Endarterectomy;  Surgeon: Sherren Kerns, MD;  Location: [facility] OR;  Service: Vascular;  Laterality: Left;  . FEMORAL-POPLITEAL BYPASS GRAFT Left 12/02/2013   Procedure:  LEFT FEMORAL-BELOW KNEE POPLITEAL ARTERY  BYPASS GRAFT;  Surgeon: Sherren Kerns, MD;  Location: [facility] OR;  Service: Vascular;  Laterality: Left;  . FEMORAL-POPLITEAL BYPASS GRAFT Left 09/25/2019   Procedure: THROMBECTOMY and PROPATEN BYPASS  FEMORAL TO BELOW KNEE POPLITEAL ARTERY BYPASS GRAFT LEFT LEG;  Surgeon: Larina Earthly, MD;  Location: [facility] OR;  Service: Vascular;  Laterality: Left;  . LEFT HEART CATHETERIZATION WITH CORONARY ANGIOGRAM N/A 05/19/2013   Procedure: LEFT HEART CATHETERIZATION WITH CORONARY ANGIOGRAM;  Surgeon: Micheline Chapman, MD;  Location: [facility] CATH LAB;  Service: Cardiovascular;  Laterality: N/A;  . LEFT HEART CATHETERIZATION WITH CORONARY ANGIOGRAM N/A 10/28/2014   Procedure: LEFT HEART CATHETERIZATION WITH CORONARY ANGIOGRAM;  Surgeon: Iran Ouch, MD;  Location: [facility] CATH LAB;  Service: Cardiovascular;  Laterality: N/A;  . LOWER EXTREMITY  ANGIOGRAM Left 05/12/2019   Procedure: Lower Extremity Angiogram;  Surgeon: Elam Dutch, MD;  Location: [place];  Service: Vascular;  Laterality: Left;  . PATCH ANGIOPLASTY Left 05/12/2019   Procedure: Left Femoral Patch Angioplasty USING CADAVERIC SAPHENOUS VEIN;  Surgeon: Elam Dutch, MD;  Location: [place];  Service: Vascular;  Laterality: Left;  . PERCUTANEOUS CORONARY STENT INTERVENTION (PCI-S)  05/19/2013   Procedure: PERCUTANEOUS CORONARY STENT INTERVENTION (PCI-S);  Surgeon: Blane Ohara, MD;  Location: [facility] CATH LAB;  Service: Cardiovascular;;  . PERMANENT PACEMAKER INSERTION N/A 11/04/2014   STJ Leadless pacemaker implanted by Dr Rayann Heman for tachy/brady  . TEE WITHOUT CARDIOVERSION N/A 07/17/2016   Procedure: TRANSESOPHAGEAL ECHOCARDIOGRAM (TEE);  Surgeon: Ivin Poot, MD;  Location: [place];  Service: Open Heart Surgery;  Laterality: N/A;  . THROMBECTOMY FEMORAL ARTERY Left 05/12/2019   Procedure: Left Ilio-Femoral Artery and Femoral-popliteal Graft Thrombectomy;  Surgeon: Elam Dutch, MD;  Location: [place];  Service: Vascular;  Laterality: Left;  . TUBAL  LIGATION    . VISCERAL ANGIOGRAPHY N/A 04/25/2019   Procedure: MESENTRIC  ANGIOGRAPHY;  Surgeon: Elam Dutch, MD;  Location: [place] CV LAB;  Service: Cardiovascular;  Laterality: N/A;  . WOUND DEBRIDEMENT Left 11/03/2019   Procedure: Debridement Wound of left above the knee amputation;  Surgeon: Elam Dutch, MD;  Location: [facility] OR;  Service: Vascular;  Laterality: Left;    Social History   Socioeconomic History  . Marital status: Single    Spouse name: Not on file  . Number of children: 3  . Years of education: Not on file  . Highest education level: Not on file  Occupational History  . Occupation: retired  Tobacco Use  . Smoking status: Former Smoker    Types: Cigarettes    Quit date: 2019    Years since quitting: 2.4  . Smokeless tobacco: Never Used  . Tobacco comment:    Vaping Use  . Vaping Use: Never used  Substance and Sexual Activity  . Alcohol use: No    Alcohol/week: 0.0 standard drinks  . Drug use: Not Currently    Types: Marijuana, Other-see comments    Comment:  and CBD oil  last use of both was 10/29/19  . Sexual activity: Not on file  Other Topics Concern  . Not on file  Social History Narrative   Lives alone.   Social Determinants of Health   Financial Resource Strain:   . Difficulty of Paying Living Expenses:   Food Insecurity:   . Worried About Charity fundraiser in the Last Year:   . Arboriculturist in the Last Year:   Transportation Needs:   . Film/video editor (Medical):   [name] Lack of Transportation (Non-Medical):   Physical Activity:   . Days of Exercise per Week:   . Minutes of Exercise per Session:   Stress:   . Feeling of Stress :   Social Connections:   . Frequency of Communication with Friends and Family:   . Frequency of Social Gatherings with Friends and Family:   . Attends Religious Services:   . Active Member of Clubs or Organizations:   . Attends Archivist Meetings:   [name] Marital Status:   Intimate  Partner Violence:   . Fear of Current or Ex-Partner:   . Emotionally Abused:   [name] Physically Abused:   . Sexually Abused:     Family History  Problem Relation Age of Onset  .  Arthritis Mother   . Emphysema Mother   . Arthritis Father   . COPD Father   . Heart disease Father   . Diabetes Paternal Aunt        paternal Aunts  . Heart disease Paternal Aunt   . Thyroid disease Neg Hx     Current Outpatient Medications  Medication Sig Dispense Refill  . acetaminophen (TYLENOL) 500 MG tablet Take 500-1,000 mg by mouth every 8 (eight) hours as needed for mild pain or headache.    [name] ELIQUIS 5 MG TABS tablet TAKE 1 TABLET BY MOUTH TWICE DAILY (Patient taking differently: Take 5 mg by mouth 2 (two) times daily. ) 180 tablet 3  . Ensure Max Protein (ENSURE MAX PROTEIN) LIQD Take 8 oz by mouth 2 (two) times daily after a meal.     . metoprolol tartrate (LOPRESSOR) 25 MG tablet Take 0.5 tablets (12.5 mg total) by mouth 2 (two) times daily. 180 tablet 3  . mirtazapine (REMERON) 15 MG tablet Take 1 tablet (15 mg total) by mouth at bedtime. 30 tablet 5  . Multiple Vitamin (MULTIVITAMIN WITH MINERALS) TABS tablet Take 1 tablet by mouth daily. (Patient taking differently: Take 1 tablet by mouth 3 (three) times a week. )    . nitroGLYCERIN (NITROSTAT) 0.4 MG SL tablet PLACE AND DISSOLVE 1 TABLET UNDER THE TONGUE EVERY 5 MINUTES AS NEEDED FOR CHEST PAIN (Patient taking differently: Place 0.4 mg under the tongue every 5 (five) minutes as needed for chest pain. ) 25 tablet 3  . oxyCODONE (ROXICODONE) 5 MG immediate release tablet Take 1 tablet (5 mg total) by mouth every 6 (six) hours as needed for severe pain. 30 tablet 0  . rosuvastatin (CRESTOR) 40 MG tablet Take 1 tablet (40 mg total) by mouth daily. 90 tablet 3   No current facility-administered medications for this visit.    Allergies  Allergen Reactions  . Brilinta [Ticagrelor] Anaphylaxis and Other (See Comments)    Also, chest tightness  .  Latex Rash  . Tape Rash     REVIEW OF SYSTEMS:   [X]  denotes positive finding, [ ]  denotes negative finding Cardiac  Comments:  Chest pain or chest pressure:    Shortness of breath upon exertion:    Short of breath when lying flat:    Irregular heart rhythm:        Vascular    Pain in calf, thigh, or hip brought on by ambulation:    Pain in feet at night that wakes you up from your sleep:     Blood clot in your veins:    Leg swelling:         Pulmonary    Oxygen at home:    Productive cough:     Wheezing:         Neurologic    Sudden weakness in arms or legs:     Sudden numbness in arms or legs:     Sudden onset of difficulty speaking or slurred speech:    Temporary loss of vision in one eye:     Problems with dizziness:         Gastrointestinal    Blood in stool:     Vomited blood:         Genitourinary    Burning when urinating:     Blood in urine:        Psychiatric    Major depression:         Hematologic  Bleeding problems:    Problems with blood clotting too easily:        Skin    Rashes or ulcers: x Almost healed      Constitutional    Fever or chills:      PHYSICAL EXAMINATION:  Today's Vitals   02/18/20 1250  BP: (!) 163/94  Pulse: 89  Resp: 20  Temp: (!) 97.2 F (36.2 C)  TempSrc: Temporal  SpO2: 100%  Height: 5\' 6"  (1.676 m)   Body mass index is 15.33 kg/m.   General:  WDWN in NAD; vital signs documented above Gait: Not observed HENT: WNL, normocephalic Pulmonary: normal non-labored breathing Skin: without rashes Vascular Exam/Pulses: Left AKA stump is warm; incision is healing but slow to heal Extremities:     Musculoskeletal: no muscle wasting or atrophy  Neurologic: A&O X 3;  No focal weakness or paresthesias are detected Psychiatric:  The pt has Normal affect.   Non-Invasive Vascular Imaging  None today    ASSESSMENT/PLAN:: 64 y.o. female here for follow up for left AKA wound after debridement of left AKA and  placement of vac dressing on 11/03/2019   -her left AKA wound has improved since I saw her about a month ago.  I discussed pt's wound with Dr. 01/03/2020 and he saw picture and we will write rx for stump shrinker today.  Will have her f/u in 4-6 weeks to check her stump again.  Hopefully by her next visit, she will be ready to begin fitting for prosthesis as she is anxious for this.  -encouraged her to continue to get good nutrition and continue her Ensure drinks.  -HHPT was supposed to be arranged after her last office visit with Darrick Penna, but this was not done and not sure why as she is established with [facility].  Will try again to get HHPT arranged for her.  -she will call [facility] sooner if she has any issues before her next visit. -she states she has appt with Dr. [place] in August.    September, [facility] [phone]  Clinic MD:   [phone]

## 2020-02-25 ENCOUNTER — Other Ambulatory Visit: Payer: Self-pay

## 2020-02-25 DIAGNOSIS — Z89612 Acquired absence of left leg above knee: Secondary | ICD-10-CM

## 2020-02-25 DIAGNOSIS — D689 Coagulation defect, unspecified: Secondary | ICD-10-CM | POA: Diagnosis not present

## 2020-02-25 DIAGNOSIS — M069 Rheumatoid arthritis, unspecified: Secondary | ICD-10-CM | POA: Diagnosis not present

## 2020-02-25 DIAGNOSIS — I4891 Unspecified atrial fibrillation: Secondary | ICD-10-CM | POA: Diagnosis not present

## 2020-02-25 DIAGNOSIS — I252 Old myocardial infarction: Secondary | ICD-10-CM | POA: Diagnosis not present

## 2020-02-25 DIAGNOSIS — I25118 Atherosclerotic heart disease of native coronary artery with other forms of angina pectoris: Secondary | ICD-10-CM | POA: Diagnosis not present

## 2020-02-25 DIAGNOSIS — I739 Peripheral vascular disease, unspecified: Secondary | ICD-10-CM

## 2020-03-14 DIAGNOSIS — M79662 Pain in left lower leg: Secondary | ICD-10-CM | POA: Diagnosis not present

## 2020-03-18 ENCOUNTER — Ambulatory Visit: Payer: Medicare Other

## 2020-03-22 ENCOUNTER — Ambulatory Visit (INDEPENDENT_AMBULATORY_CARE_PROVIDER_SITE_OTHER): Payer: Medicare Other | Admitting: Physician Assistant

## 2020-03-22 ENCOUNTER — Other Ambulatory Visit: Payer: Self-pay

## 2020-03-22 ENCOUNTER — Encounter: Payer: Self-pay | Admitting: Physician Assistant

## 2020-03-22 VITALS — BP 151/84 | HR 68 | Temp 98.1°F | Ht 66.0 in

## 2020-03-22 DIAGNOSIS — Z89612 Acquired absence of left leg above knee: Secondary | ICD-10-CM | POA: Diagnosis not present

## 2020-03-22 DIAGNOSIS — I739 Peripheral vascular disease, unspecified: Secondary | ICD-10-CM | POA: Diagnosis not present

## 2020-03-22 NOTE — Progress Notes (Signed)
Office Note     CC:  follow up Requesting Provider:  Pincus Sanes, MD  HPI: Mercedes [name] is a 64 y.o. (04/28/1956) female who presents for follow up of non healing left AKA. She is s/p left above knee amputation on 10/01/2019 by Dr. Darrick Penna. She subsequently underwent debridement of left AKA and placement of vac dressing on 11/03/2019 also by Dr. Darrick Penna. Since then she has had continued wound care both with [facility] and on her own. She has been working on improving her nutrition to help with wound healing and also has been trying to increase her activity.  She was last seen on 02/18/20 and she was doing well.  She was given an order for a stump shrinker as it was felt her wound had healed adequately enough to start working towards fitting for prosthesis. HHPT was also arranged. She was instructed to follow up in 4-6 weeks for stump check again  She is here today for her follow up visit.  She states that approximately week and a half ago she started to have increased swelling in left AKA stump. She says it sort of comes and goes but she has been unable to put on her shrinker because it feels too tight and tender. She also has been having increasing pain in the thigh that is keeping her up at night. The pain will occasionally occur during day but mostly at night. She says she has been having to resort back to taking her pain medication and tylenol. She also says over past 4-5 days she has been having some phantom pains in left leg. These had gone away at time of her last visit. She has not really been trying to elevate her AKA stump. She has been trying to walk more with her walker. She still has not had any HH PT since her last visit.  She has known SFA occlusion in the RLE with single vessel peroneal runoff. She does not have any claudication symptoms, rest pain or non healing wounds of right leg. She does notice however that it is always cold  The pt is on a statin for cholesterol management.  The pt  is not on a daily aspirin.   Other AC:  Eliquis The pt is on BB for hypertension.   The pt is not diabetic. Tobacco hx:  former  Past Medical History:  Diagnosis Date  . Abnormal LFTs    a. in the past when on Lipitor  . Anemia    as a young woman  . Anxiety   . Arnold-Chiari malformation, type I (HCC)    MRI brain 02/2010  . CAD (coronary artery disease)    a. NSTEMI 07/2009: LHC - D2 40%, LAD irreg., EF 50% with apical AK (tako-tsubo CM);  b. Inf STEMI (04/2013): Tx Promus DES to CFX;  c. 08/2013 Lexi CL: No ischemia, dist ant/ap/inf-ap infarct, EF  d. 06/2016 NSTEMI with LM disease: s/p CABG on 07/17/2016 w/ LIMA-LAD, SVG-OM, and SVG-dRCA.  [name] DEPRESSION   . Dizziness    ? CVA 01/2010 - carotid dopplers with no ICA stenosis  . GERD (gastroesophageal reflux disease)   . Headache   . 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 HYPERLIPIDEMIA   . HYPERTENSION   . MI (myocardial infarction) (HCC)   . PAD (peripheral artery disease) (HCC)    s/p L fem pop 11/2013 in setting of 1st toe osteo/gangrene  . Paroxysmal atrial fibrillation (HCC)  a. anticoagulated with Eliquis 10/2014  . 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 RA (rheumatoid arthritis) (HCC)    prior tx by Dr. Dareen Piano  . Sjogren's syndrome (HCC) 06/02/13   pt denies this (12/01/13)  . SLE (systemic lupus erythematosus) (HCC) dx 05/2013   follows with rheum Dareen Piano)  . Tachy-brady syndrome (HCC)    a. s/p STJ Leadless pacemaker 11-04-2014 Dr Johney Frame  . 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 Wears dentures   . Wears glasses     Past Surgical History:  Procedure Laterality Date  . ABDOMINAL AORTAGRAM N/A 11/24/2013   Procedure: ABDOMINAL AORTAGRAM WITH BILATERAL RUNOFF WITH POSSIBLE INTERVENTION;  Surgeon: Chuck Hint, MD;  Location: [facility] CATH LAB;  Service: Cardiovascular;   Laterality: N/A;  . ABDOMINAL AORTOGRAM W/LOWER EXTREMITY N/A 04/25/2019   Procedure: ABDOMINAL AORTOGRAM W/LOWER EXTREMITY;  Surgeon: Sherren Kerns, MD;  Location: [facility] INVASIVE CV LAB;  Service: Cardiovascular;  Laterality: N/A;  . ABDOMINAL AORTOGRAM W/LOWER EXTREMITY Bilateral 05/15/2019   Procedure: ABDOMINAL AORTOGRAM W/LOWER EXTREMITY;  Surgeon: Nada Libman, MD;  Location: [facility] INVASIVE CV LAB;  Service: Cardiovascular;  Laterality: Bilateral;  . AMPUTATION Left 12/02/2013   Procedure: LEFT 1ST TOE AMPUTATION;  Surgeon: Sherren Kerns, MD;  Location: [facility] OR;  Service: Vascular;  Laterality: Left;  . AMPUTATION Left 07/16/2019   Procedure: LEFT TRANSMETATARSAL AMPUTATION;  Surgeon: Nadara Mustard, MD;  Location: [facility] OR;  Service: Orthopedics;  Laterality: Left;  . AMPUTATION Left 10/01/2019   Procedure: LEFT AMPUTATION ABOVE KNEE;  Surgeon: Sherren Kerns, MD;  Location: [facility] OR;  Service: Vascular;  Laterality: Left;  . APPLICATION OF WOUND VAC Left 11/03/2019   Procedure: Application Of Wound Vac, left lower extremity;  Surgeon: Sherren Kerns, MD;  Location: [facility] OR;  Service: Vascular;  Laterality: Left;  . CARDIAC CATHETERIZATION N/A 07/12/2016   Procedure: Left Heart Cath and Coronary Angiography;  Surgeon: Lennette Bihari, MD;  Location: [facility] INVASIVE CV LAB;  Service: Cardiovascular;  Laterality: N/A;  . CARDIAC CATHETERIZATION N/A 07/14/2016   Procedure: Intravascular Ultrasound/IVUS;  Surgeon: Yvonne Kendall, MD;  Location: [facility] INVASIVE CV LAB;  Service: Cardiovascular;  Laterality: N/A;  . COLONOSCOPY    . CORONARY ANGIOPLASTY  04/2013  . CORONARY ARTERY BYPASS GRAFT N/A 07/17/2016   Procedure: CORONARY ARTERY BYPASS GRAFTING (CABG)x3 using right greater saphenous vein harvested endoscopiclly and left internal mammary artery;  Surgeon: Kerin Perna, MD;  Location: [facility] OR;  Service: Open Heart Surgery;  Laterality: N/A;  . ENDARTERECTOMY FEMORAL Left 05/12/2019   Procedure: LEFT  Femoral Endarterectomy;  Surgeon: Sherren Kerns, MD;  Location: [facility] OR;  Service: Vascular;  Laterality: Left;  . FEMORAL-POPLITEAL BYPASS GRAFT Left 12/02/2013   Procedure:  LEFT FEMORAL-BELOW KNEE POPLITEAL ARTERY BYPASS GRAFT;  Surgeon: Sherren Kerns, MD;  Location: [facility] OR;  Service: Vascular;  Laterality: Left;  . FEMORAL-POPLITEAL BYPASS GRAFT Left 09/25/2019   Procedure: THROMBECTOMY and PROPATEN BYPASS  FEMORAL TO BELOW KNEE POPLITEAL ARTERY BYPASS GRAFT LEFT LEG;  Surgeon: Larina Earthly, MD;  Location: [facility] OR;  Service: Vascular;  Laterality: Left;  . LEFT HEART CATHETERIZATION WITH CORONARY ANGIOGRAM N/A 05/19/2013   Procedure: LEFT HEART CATHETERIZATION WITH CORONARY ANGIOGRAM;  Surgeon: Micheline Chapman, MD;  Location: [facility] CATH LAB;  Service: Cardiovascular;  Laterality: N/A;  . LEFT HEART CATHETERIZATION WITH CORONARY ANGIOGRAM N/A  10/28/2014   Procedure: LEFT HEART CATHETERIZATION WITH CORONARY ANGIOGRAM;  Surgeon: Iran Ouch, MD;  Location: [facility] CATH LAB;  Service: Cardiovascular;  Laterality: N/A;  . LOWER EXTREMITY ANGIOGRAM Left 05/12/2019   Procedure: Lower Extremity Angiogram;  Surgeon: Sherren Kerns, MD;  Location: [facility] OR;  Service: Vascular;  Laterality: Left;  . PATCH ANGIOPLASTY Left 05/12/2019   Procedure: Left Femoral Patch Angioplasty USING CADAVERIC SAPHENOUS VEIN;  Surgeon: Sherren Kerns, MD;  Location: [facility] OR;  Service: Vascular;  Laterality: Left;  . PERCUTANEOUS CORONARY STENT INTERVENTION (PCI-S)  05/19/2013   Procedure: PERCUTANEOUS CORONARY STENT INTERVENTION (PCI-S);  Surgeon: Micheline Chapman, MD;  Location: [facility] CATH LAB;  Service: Cardiovascular;;  . PERMANENT PACEMAKER INSERTION N/A 11/04/2014   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  . TEE WITHOUT CARDIOVERSION N/A 07/17/2016   Procedure: TRANSESOPHAGEAL ECHOCARDIOGRAM (TEE);  Surgeon: Kerin Perna, MD;  Location: [facility] OR;  Service: Open Heart Surgery;  Laterality: N/A;  .  THROMBECTOMY FEMORAL ARTERY Left 05/12/2019   Procedure: Left Ilio-Femoral Artery and Femoral-popliteal Graft Thrombectomy;  Surgeon: Sherren Kerns, MD;  Location: [facility] OR;  Service: Vascular;  Laterality: Left;  . TUBAL LIGATION    . VISCERAL ANGIOGRAPHY N/A 04/25/2019   Procedure: MESENTRIC  ANGIOGRAPHY;  Surgeon: Sherren Kerns, MD;  Location: [facility] INVASIVE CV LAB;  Service: Cardiovascular;  Laterality: N/A;  . WOUND DEBRIDEMENT Left 11/03/2019   Procedure: Debridement Wound of left above the knee amputation;  Surgeon: Sherren Kerns, MD;  Location: [facility] OR;  Service: Vascular;  Laterality: Left;    Social History   Socioeconomic History  . Marital status: Single    Spouse name: Not on file  . Number of children: 3  . Years of education: Not on file  . Highest education level: Not on file  Occupational History  . Occupation: retired  Tobacco Use  . Smoking status: Former Smoker    Types: Cigarettes    Quit date: 2019    Years since quitting: 2.5  . Smokeless tobacco: Never Used  . Tobacco comment:    Vaping Use  . Vaping Use: Never used  Substance and Sexual Activity  . Alcohol use: No    Alcohol/week: 0.0 standard drinks  . Drug use: Not Currently    Types: Marijuana, Other-see comments    Comment:  and CBD oil  last use of both was 10/29/19  . Sexual activity: Not on file  Other Topics Concern  . Not on file  Social History Narrative   Lives alone.   Social Determinants of Health   Financial Resource Strain:   . Difficulty of Paying Living Expenses:   Food Insecurity:   . Worried About Programme researcher, broadcasting/film/video in the Last Year:   . Barista in the Last Year:   Transportation Needs:   . Freight forwarder (Medical):   [name] Lack of Transportation (Non-Medical):   Physical Activity:   . Days of Exercise per Week:   . Minutes of Exercise per Session:   Stress:   . Feeling of Stress :   Social Connections:   . Frequency of Communication with Friends and  Family:   . Frequency of Social Gatherings with Friends and Family:   . Attends Religious Services:   . Active Member of Clubs or Organizations:   . Attends Banker Meetings:   [name] Marital Status:   Intimate Partner Violence:   . Fear of Current  or Ex-Partner:   . Emotionally Abused:   [name] Physically Abused:   . Sexually Abused:     Family History  Problem Relation Age of Onset  . Arthritis Mother   . Emphysema Mother   . Arthritis Father   . COPD Father   . Heart disease Father   . Diabetes Paternal Aunt        paternal Aunts  . Heart disease Paternal Aunt   . Thyroid disease Neg Hx     Current Outpatient Medications  Medication Sig Dispense Refill  . acetaminophen (TYLENOL) 500 MG tablet Take 500-1,000 mg by mouth every 8 (eight) hours as needed for mild pain or headache.    [name] ELIQUIS 5 MG TABS tablet TAKE 1 TABLET BY MOUTH TWICE DAILY (Patient taking differently: Take 5 mg by mouth 2 (two) times daily. ) 180 tablet 3  . Ensure Max Protein (ENSURE MAX PROTEIN) LIQD Take 8 oz by mouth 2 (two) times daily after a meal.     . metoprolol tartrate (LOPRESSOR) 25 MG tablet Take 0.5 tablets (12.5 mg total) by mouth 2 (two) times daily. 180 tablet 3  . mirtazapine (REMERON) 15 MG tablet Take 1 tablet (15 mg total) by mouth at bedtime. 30 tablet 5  . Multiple Vitamin (MULTIVITAMIN WITH MINERALS) TABS tablet Take 1 tablet by mouth daily. (Patient taking differently: Take 1 tablet by mouth 3 (three) times a week. )    . nitroGLYCERIN (NITROSTAT) 0.4 MG SL tablet PLACE AND DISSOLVE 1 TABLET UNDER THE TONGUE EVERY 5 MINUTES AS NEEDED FOR CHEST PAIN (Patient taking differently: Place 0.4 mg under the tongue every 5 (five) minutes as needed for chest pain. ) 25 tablet 3  . oxyCODONE (ROXICODONE) 5 MG immediate release tablet Take 1 tablet (5 mg total) by mouth every 6 (six) hours as needed for severe pain. 30 tablet 0  . rosuvastatin (CRESTOR) 40 MG tablet Take 1 tablet (40 mg total)  by mouth daily. 90 tablet 3   No current facility-administered medications for this visit.    Allergies  Allergen Reactions  . Brilinta [Ticagrelor] Anaphylaxis and Other (See Comments)    Also, chest tightness  . Latex Rash  . Tape Rash     REVIEW OF SYSTEMS:  [X]  denotes positive finding, [ ]  denotes negative finding Cardiac  Comments:  Chest pain or chest pressure:    Shortness of breath upon exertion:    Short of breath when lying flat:    Irregular heart rhythm:        Vascular    Pain in calf, thigh, or hip brought on by ambulation:    Pain in feet at night that wakes you up from your sleep:     Blood clot in your veins:    Leg swelling:         Pulmonary    Oxygen at home:    Productive cough:     Wheezing:         Neurologic    Sudden weakness in arms or legs:     Sudden numbness in arms or legs:     Sudden onset of difficulty speaking or slurred speech:    Temporary loss of vision in one eye:     Problems with dizziness:         Gastrointestinal    Blood in stool:     Vomited blood:         Genitourinary    Burning when urinating:  Blood in urine:        Psychiatric    Major depression:         Hematologic    Bleeding problems:    Problems with blood clotting too easily:        Skin    Rashes or ulcers:        Constitutional    Fever or chills:      PHYSICAL EXAMINATION:  Vitals:   03/22/20 1305  BP: (!) 151/84  Pulse: 68  Temp: 98.1 F (36.7 C)  TempSrc: Temporal  SpO2: 99%  Height: 5\' 6"  (1.676 m)    General:  WDWN in NAD; vital signs documented above Gait: Not observed, in wheelchair HENT: WNL, normocephalic Pulmonary: normal non-labored breathing , without wheezing Cardiac: regular HR, without  Murmurs  Abdomen: soft, NT, no masses Vascular Exam/Pulses:  Right Left  Radial 2+ (normal) 2+ (normal)  Ulnar 2+ (normal) 2+ (normal)  Femoral 2+ (normal) 2+ (normal)  Popliteal Not palpable amputation  DP Not palpable  Amputation  PT Not palpable amputation   Extremities: without ischemic changes, without Gangrene , without cellulitis; without open wounds; left thigh with some swelling. Skin appears slightly shinny and tight at distal stump. Distal left AKA stump cool to touch. Incision is almost completely healed. Small 1 cm eschar on lateral incision line. No tenderness to palpation. No appreciable fluid collections. Right distal leg and foot cool to touch. Motor and sensation intact Musculoskeletal: no muscle wasting or atrophy  Neurologic: A&O X 3;  No focal weakness or paresthesias are detected Psychiatric:  The pt has Normal affect.  ASSESSMENT/PLAN:: 63 y.o. female here for follow up for non healing left AKA. Her wounds are now almost completely healed. She has a very small eschar on lateral aspect of incision. She has been having increased pain and swelling in the left AKA stump over past couple weeks. RLE is cool to touch with no palpable distal pulses but she is without symptoms. Will continue to monitor this in follow up - I have encouraged her to elevate her left AKA on pillows - Her pain is hard to determine if it is neuropathic vs rest pain. Will see if she has improvement with resolution of swelling. May need duplex or angiogram of left thigh if it is rest pain - she will continue her Rosuvastatin and Eliquis - I will have her follow up with Dr. Darrick Penna in 3 weeks to evaluate her AKA pain   Graceann Congress, PA-C [facility] [phone]  On call MD:  Dr. Darrick Penna

## 2020-03-23 ENCOUNTER — Other Ambulatory Visit: Payer: Self-pay

## 2020-03-23 DIAGNOSIS — Z89612 Acquired absence of left leg above knee: Secondary | ICD-10-CM

## 2020-03-23 DIAGNOSIS — I739 Peripheral vascular disease, unspecified: Secondary | ICD-10-CM

## 2020-03-31 ENCOUNTER — Telehealth: Payer: Self-pay

## 2020-03-31 NOTE — Telephone Encounter (Signed)
A Rep from [facility] called to say patient will be starting her Physical Therapy with them today.  Ernst Spell., LPN

## 2020-04-01 DIAGNOSIS — I252 Old myocardial infarction: Secondary | ICD-10-CM | POA: Diagnosis not present

## 2020-04-01 DIAGNOSIS — I739 Peripheral vascular disease, unspecified: Secondary | ICD-10-CM | POA: Diagnosis not present

## 2020-04-01 DIAGNOSIS — I2510 Atherosclerotic heart disease of native coronary artery without angina pectoris: Secondary | ICD-10-CM | POA: Diagnosis not present

## 2020-04-01 DIAGNOSIS — D649 Anemia, unspecified: Secondary | ICD-10-CM | POA: Diagnosis not present

## 2020-04-01 DIAGNOSIS — Z950 Presence of cardiac pacemaker: Secondary | ICD-10-CM | POA: Diagnosis not present

## 2020-04-01 DIAGNOSIS — Z89612 Acquired absence of left leg above knee: Secondary | ICD-10-CM | POA: Diagnosis not present

## 2020-04-01 DIAGNOSIS — I119 Hypertensive heart disease without heart failure: Secondary | ICD-10-CM | POA: Diagnosis not present

## 2020-04-01 DIAGNOSIS — T8789 Other complications of amputation stump: Secondary | ICD-10-CM | POA: Diagnosis not present

## 2020-04-01 DIAGNOSIS — Q0700 Arnold-Chiari syndrome without spina bifida or hydrocephalus: Secondary | ICD-10-CM | POA: Diagnosis not present

## 2020-04-01 DIAGNOSIS — M329 Systemic lupus erythematosus, unspecified: Secondary | ICD-10-CM | POA: Diagnosis not present

## 2020-04-01 DIAGNOSIS — M069 Rheumatoid arthritis, unspecified: Secondary | ICD-10-CM | POA: Diagnosis not present

## 2020-04-01 DIAGNOSIS — Z87891 Personal history of nicotine dependence: Secondary | ICD-10-CM | POA: Diagnosis not present

## 2020-04-01 DIAGNOSIS — I480 Paroxysmal atrial fibrillation: Secondary | ICD-10-CM | POA: Diagnosis not present

## 2020-04-01 DIAGNOSIS — K219 Gastro-esophageal reflux disease without esophagitis: Secondary | ICD-10-CM | POA: Diagnosis not present

## 2020-04-01 DIAGNOSIS — M3500 Sicca syndrome, unspecified: Secondary | ICD-10-CM | POA: Diagnosis not present

## 2020-04-01 DIAGNOSIS — Z7901 Long term (current) use of anticoagulants: Secondary | ICD-10-CM | POA: Diagnosis not present

## 2020-04-01 DIAGNOSIS — E785 Hyperlipidemia, unspecified: Secondary | ICD-10-CM | POA: Diagnosis not present

## 2020-04-15 ENCOUNTER — Ambulatory Visit: Payer: Medicare Other | Admitting: Vascular Surgery

## 2020-04-15 DIAGNOSIS — E785 Hyperlipidemia, unspecified: Secondary | ICD-10-CM | POA: Diagnosis not present

## 2020-04-15 DIAGNOSIS — Z950 Presence of cardiac pacemaker: Secondary | ICD-10-CM | POA: Diagnosis not present

## 2020-04-15 DIAGNOSIS — I119 Hypertensive heart disease without heart failure: Secondary | ICD-10-CM | POA: Diagnosis not present

## 2020-04-15 DIAGNOSIS — I2510 Atherosclerotic heart disease of native coronary artery without angina pectoris: Secondary | ICD-10-CM | POA: Diagnosis not present

## 2020-04-15 DIAGNOSIS — T8789 Other complications of amputation stump: Secondary | ICD-10-CM | POA: Diagnosis not present

## 2020-04-15 DIAGNOSIS — I252 Old myocardial infarction: Secondary | ICD-10-CM | POA: Diagnosis not present

## 2020-04-15 DIAGNOSIS — M3500 Sicca syndrome, unspecified: Secondary | ICD-10-CM | POA: Diagnosis not present

## 2020-04-15 DIAGNOSIS — Z89612 Acquired absence of left leg above knee: Secondary | ICD-10-CM | POA: Diagnosis not present

## 2020-04-15 DIAGNOSIS — Z87891 Personal history of nicotine dependence: Secondary | ICD-10-CM | POA: Diagnosis not present

## 2020-04-15 DIAGNOSIS — K219 Gastro-esophageal reflux disease without esophagitis: Secondary | ICD-10-CM | POA: Diagnosis not present

## 2020-04-15 DIAGNOSIS — I480 Paroxysmal atrial fibrillation: Secondary | ICD-10-CM | POA: Diagnosis not present

## 2020-04-15 DIAGNOSIS — M069 Rheumatoid arthritis, unspecified: Secondary | ICD-10-CM | POA: Diagnosis not present

## 2020-04-15 DIAGNOSIS — Q0700 Arnold-Chiari syndrome without spina bifida or hydrocephalus: Secondary | ICD-10-CM | POA: Diagnosis not present

## 2020-04-15 DIAGNOSIS — I739 Peripheral vascular disease, unspecified: Secondary | ICD-10-CM | POA: Diagnosis not present

## 2020-04-15 DIAGNOSIS — D649 Anemia, unspecified: Secondary | ICD-10-CM | POA: Diagnosis not present

## 2020-04-15 DIAGNOSIS — M329 Systemic lupus erythematosus, unspecified: Secondary | ICD-10-CM | POA: Diagnosis not present

## 2020-04-15 DIAGNOSIS — Z7901 Long term (current) use of anticoagulants: Secondary | ICD-10-CM | POA: Diagnosis not present

## 2020-04-18 NOTE — Progress Notes (Signed)
Subjective:    Patient ID: Mercedes [name], female    DOB: 29-Nov-1955, 64 y.o.   MRN: [id number]  HPI The patient is here for follow up of their chronic medical problems, including PAD s/p L AKA, CAD, htn, hyperlipidemia, DM, poor sleep, weight loos, depression   She is taking all of her medications as prescribed.     Medications and allergies reviewed with patient and updated if appropriate.  Patient Active Problem List   Diagnosis Date Noted  . Depression 12/15/2019  . Non-healing surgical wound 10/30/2019  . Stump pain (HCC) 10/21/2019  . S/P AKA (above knee amputation), left (HCC), 10/01/19 10/20/2019  . Femoral-popliteal bypass graft occlusion, left (HCC) 09/25/2019  . Peripheral arterial disease (HCC) 09/25/2019  . Protein-calorie malnutrition, severe 05/15/2019  . Critical lower limb ischemia 05/09/2019  . Dyspnea 02/01/2019  . Weight loss, unintentional 02/01/2019  . Epigastric pain 02/01/2019  . 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 Acute renal failure superimposed on stage 2 chronic kidney disease (HCC) 10/20/2018  . Diabetes (HCC) 04/01/2017  . Hyperthyroidism 02/21/2017  . Low mean corpuscular volume (MCV) 09/30/2016  . Coronary artery disease involving native heart without angina pectoris   . Hx of CABG 07/17/2016  . Paroxysmal atrial fibrillation (HCC) 07/12/2016  . NSTEMI (non-ST elevated myocardial infarction) (HCC) 07/11/2016  . History of arterial bypass of lower extremity 12/30/2015  . Claudication of right lower extremity (HCC) 12/30/2015  . Phantom limb pain-Left great toe 04/29/2015  . Seizure (HCC)   . Atrial flutter with rapid ventricular response vs. SVT 10/27/2014  . Atherosclerosis of native artery of left lower extremity with rest pain (HCC) 12/18/2013  . PAD (peripheral artery disease) (HCC) 12/02/2013  . SLE (systemic lupus erythematosus) (HCC)   . CAD - S/P Takotsubo MI in 2000, CFX DES Sept 2014 05/25/2013  . Acute MI, inferior wall (HCC)  05/19/2013  . 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 Tachycardia-bradycardia (HCC) 03/09/2010  . PVD- s/p Lt [facility] April 2015 10/29/2009  . Dyslipidemia 08/26/2009  . Essential hypertension 08/26/2009  . CORONARY ATHEROSCLEROSIS NATIVE CORONARY ARTERY 08/26/2009  . 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 Rheumatoid arthritis (HCC) 08/26/2009    Current Outpatient Medications on File Prior to Visit  Medication Sig Dispense Refill  . acetaminophen (TYLENOL) 500 MG tablet Take 500-1,000 mg by mouth every 8 (eight) hours as needed for mild pain or headache.    [name] ELIQUIS 5 MG TABS tablet TAKE 1 TABLET BY MOUTH TWICE DAILY (Patient taking differently: Take 5 mg by mouth 2 (two) times daily. ) 180 tablet 3  . Ensure Max Protein (ENSURE MAX PROTEIN) LIQD Take 8 oz by mouth 2 (two) times daily after a meal.     . metoprolol tartrate (LOPRESSOR) 25 MG tablet Take 0.5 tablets (12.5 mg total) by mouth 2 (two) times daily. 180 tablet 3  . mirtazapine (REMERON) 15 MG tablet Take 1 tablet (15 mg total) by mouth at bedtime. 30 tablet 5  . Multiple Vitamin (MULTIVITAMIN WITH MINERALS) TABS tablet Take 1 tablet by mouth daily. (Patient taking differently: Take 1 tablet by mouth 3 (three) times a week. )    . nitroGLYCERIN (NITROSTAT) 0.4 MG SL tablet PLACE AND DISSOLVE 1 TABLET UNDER THE TONGUE EVERY 5 MINUTES AS NEEDED FOR CHEST PAIN (Patient taking differently: Place 0.4 mg under the tongue every 5 (five) minutes as needed for chest pain. ) 25 tablet 3  . oxyCODONE (ROXICODONE) 5 MG immediate release tablet Take 1 tablet (  5 mg total) by mouth every 6 (six) hours as needed for severe pain. 30 tablet 0  . rosuvastatin (CRESTOR) 40 MG tablet Take 1 tablet (40 mg total) by mouth daily. 90 tablet 3   No current facility-administered medications on file prior to visit.    Past Medical History:  Diagnosis Date  . Abnormal LFTs    a. in the past when on Lipitor  . Anemia    as a young woman  . Anxiety     . Arnold-Chiari malformation, type I (HCC)    MRI brain 02/2010  . CAD (coronary artery disease)    a. NSTEMI 07/2009: LHC - D2 40%, LAD irreg., EF 50% with apical AK (tako-tsubo CM);  b. Inf STEMI (04/2013): Tx Promus DES to CFX;  c. 08/2013 Lexi CL: No ischemia, dist ant/ap/inf-ap infarct, EF  d. 06/2016 NSTEMI with LM disease: s/p CABG on 07/17/2016 w/ LIMA-LAD, SVG-OM, and SVG-dRCA.  [name] DEPRESSION   . Dizziness    ? CVA 01/2010 - carotid dopplers with no ICA stenosis  . GERD (gastroesophageal reflux disease)   . Headache   . 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 HYPERLIPIDEMIA   . HYPERTENSION   . MI (myocardial infarction) (HCC)   . PAD (peripheral artery disease) (HCC)    s/p L fem pop 11/2013 in setting of 1st toe osteo/gangrene  . Paroxysmal atrial fibrillation (HCC)    a. anticoagulated with Eliquis 10/2014  . 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 RA (rheumatoid arthritis) (HCC)    prior tx by Dr. Dareen Piano  . Sjogren's syndrome (HCC) 06/02/13   pt denies this (12/01/13)  . SLE (systemic lupus erythematosus) (HCC) dx 05/2013   follows with rheum Dareen Piano)  . Tachy-brady syndrome (HCC)    a. s/p STJ Leadless pacemaker 11-04-2014 Dr Johney Frame  . 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 Wears dentures   . Wears glasses     Past Surgical History:  Procedure Laterality Date  . ABDOMINAL AORTAGRAM N/A 11/24/2013   Procedure: ABDOMINAL AORTAGRAM WITH BILATERAL RUNOFF WITH POSSIBLE INTERVENTION;  Surgeon: Chuck Hint, MD;  Location: [facility] CATH LAB;  Service: Cardiovascular;  Laterality: N/A;  . ABDOMINAL AORTOGRAM W/LOWER EXTREMITY N/A 04/25/2019   Procedure: ABDOMINAL AORTOGRAM W/LOWER EXTREMITY;  Surgeon: Sherren Kerns, MD;  Location: [facility] INVASIVE CV LAB;  Service: Cardiovascular;  Laterality: N/A;  . ABDOMINAL AORTOGRAM W/LOWER  EXTREMITY Bilateral 05/15/2019   Procedure: ABDOMINAL AORTOGRAM W/LOWER EXTREMITY;  Surgeon: Nada Libman, MD;  Location: [facility] INVASIVE CV LAB;  Service: Cardiovascular;  Laterality: Bilateral;  . AMPUTATION Left 12/02/2013   Procedure: LEFT 1ST TOE AMPUTATION;  Surgeon: Sherren Kerns, MD;  Location: [facility] OR;  Service: Vascular;  Laterality: Left;  . AMPUTATION Left 07/16/2019   Procedure: LEFT TRANSMETATARSAL AMPUTATION;  Surgeon: Nadara Mustard, MD;  Location: [facility] OR;  Service: Orthopedics;  Laterality: Left;  . AMPUTATION Left 10/01/2019   Procedure: LEFT AMPUTATION ABOVE KNEE;  Surgeon: Sherren Kerns, MD;  Location: [facility] OR;  Service: Vascular;  Laterality: Left;  . APPLICATION OF WOUND VAC Left 11/03/2019   Procedure: Application Of Wound Vac, left lower extremity;  Surgeon: Sherren Kerns, MD;  Location: [facility] OR;  Service: Vascular;  Laterality: Left;  . CARDIAC CATHETERIZATION N/A 07/12/2016   Procedure: Left Heart Cath and Coronary Angiography;  Surgeon: Clovis Pu  Tresa Endo, MD;  Location: [facility] INVASIVE CV LAB;  Service: Cardiovascular;  Laterality: N/A;  . CARDIAC CATHETERIZATION N/A 07/14/2016   Procedure: Intravascular Ultrasound/IVUS;  Surgeon: Yvonne Kendall, MD;  Location: [facility] INVASIVE CV LAB;  Service: Cardiovascular;  Laterality: N/A;  . COLONOSCOPY    . CORONARY ANGIOPLASTY  04/2013  . CORONARY ARTERY BYPASS GRAFT N/A 07/17/2016   Procedure: CORONARY ARTERY BYPASS GRAFTING (CABG)x3 using right greater saphenous vein harvested endoscopiclly and left internal mammary artery;  Surgeon: Kerin Perna, MD;  Location: [facility] OR;  Service: Open Heart Surgery;  Laterality: N/A;  . ENDARTERECTOMY FEMORAL Left 05/12/2019   Procedure: LEFT Femoral Endarterectomy;  Surgeon: Sherren Kerns, MD;  Location: [facility] OR;  Service: Vascular;  Laterality: Left;  . FEMORAL-POPLITEAL BYPASS GRAFT Left 12/02/2013   Procedure:  LEFT FEMORAL-BELOW KNEE POPLITEAL ARTERY BYPASS GRAFT;  Surgeon: Sherren Kerns, MD;   Location: [facility] OR;  Service: Vascular;  Laterality: Left;  . FEMORAL-POPLITEAL BYPASS GRAFT Left 09/25/2019   Procedure: THROMBECTOMY and PROPATEN BYPASS  FEMORAL TO BELOW KNEE POPLITEAL ARTERY BYPASS GRAFT LEFT LEG;  Surgeon: Larina Earthly, MD;  Location: [facility] OR;  Service: Vascular;  Laterality: Left;  . LEFT HEART CATHETERIZATION WITH CORONARY ANGIOGRAM N/A 05/19/2013   Procedure: LEFT HEART CATHETERIZATION WITH CORONARY ANGIOGRAM;  Surgeon: Micheline Chapman, MD;  Location: [facility] CATH LAB;  Service: Cardiovascular;  Laterality: N/A;  . LEFT HEART CATHETERIZATION WITH CORONARY ANGIOGRAM N/A 10/28/2014   Procedure: LEFT HEART CATHETERIZATION WITH CORONARY ANGIOGRAM;  Surgeon: Iran Ouch, MD;  Location: [facility] CATH LAB;  Service: Cardiovascular;  Laterality: N/A;  . LOWER EXTREMITY ANGIOGRAM Left 05/12/2019   Procedure: Lower Extremity Angiogram;  Surgeon: Sherren Kerns, MD;  Location: [facility] OR;  Service: Vascular;  Laterality: Left;  . PATCH ANGIOPLASTY Left 05/12/2019   Procedure: Left Femoral Patch Angioplasty USING CADAVERIC SAPHENOUS VEIN;  Surgeon: Sherren Kerns, MD;  Location: [facility] OR;  Service: Vascular;  Laterality: Left;  . PERCUTANEOUS CORONARY STENT INTERVENTION (PCI-S)  05/19/2013   Procedure: PERCUTANEOUS CORONARY STENT INTERVENTION (PCI-S);  Surgeon: Micheline Chapman, MD;  Location: [facility] CATH LAB;  Service: Cardiovascular;;  . PERMANENT PACEMAKER INSERTION N/A 11/04/2014   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  . TEE WITHOUT CARDIOVERSION N/A 07/17/2016   Procedure: TRANSESOPHAGEAL ECHOCARDIOGRAM (TEE);  Surgeon: Kerin Perna, MD;  Location: [facility] OR;  Service: Open Heart Surgery;  Laterality: N/A;  . THROMBECTOMY FEMORAL ARTERY Left 05/12/2019   Procedure: Left Ilio-Femoral Artery and Femoral-popliteal Graft Thrombectomy;  Surgeon: Sherren Kerns, MD;  Location: [facility] OR;  Service: Vascular;  Laterality: Left;  . TUBAL LIGATION    . VISCERAL ANGIOGRAPHY N/A 04/25/2019    Procedure: MESENTRIC  ANGIOGRAPHY;  Surgeon: Sherren Kerns, MD;  Location: [facility] INVASIVE CV LAB;  Service: Cardiovascular;  Laterality: N/A;  . WOUND DEBRIDEMENT Left 11/03/2019   Procedure: Debridement Wound of left above the knee amputation;  Surgeon: Sherren Kerns, MD;  Location: [facility] OR;  Service: Vascular;  Laterality: Left;    Social History   Socioeconomic History  . Marital status: Single    Spouse name: Not on file  . Number of children: 3  . Years of education: Not on file  . Highest education level: Not on file  Occupational History  . Occupation: retired  Tobacco Use  . Smoking status: Former Smoker    Types: Cigarettes    Quit date: 2019    Years since quitting: 2.6  .  Smokeless tobacco: Never Used  . Tobacco comment:    Vaping Use  . Vaping Use: Never used  Substance and Sexual Activity  . Alcohol use: No    Alcohol/week: 0.0 standard drinks  . Drug use: Not Currently    Types: Marijuana, Other-see comments    Comment:  and CBD oil  last use of both was 10/29/19  . Sexual activity: Not on file  Other Topics Concern  . Not on file  Social History Narrative   Lives alone.   Social Determinants of Health   Financial Resource Strain:   . Difficulty of Paying Living Expenses: Not on file  Food Insecurity:   . Worried About Programme researcher, broadcasting/film/video in the Last Year: Not on file  . Ran Out of Food in the Last Year: Not on file  Transportation Needs:   . Lack of Transportation (Medical): Not on file  . Lack of Transportation (Non-Medical): Not on file  Physical Activity:   . Days of Exercise per Week: Not on file  . Minutes of Exercise per Session: Not on file  Stress:   . Feeling of Stress : Not on file  Social Connections:   . Frequency of Communication with Friends and Family: Not on file  . Frequency of Social Gatherings with Friends and Family: Not on file  . Attends Religious Services: Not on file  . Active Member of Clubs or Organizations: Not on file   . Attends Banker Meetings: Not on file  . Marital Status: Not on file    Family History  Problem Relation Age of Onset  . Arthritis Mother   . Emphysema Mother   . Arthritis Father   . COPD Father   . Heart disease Father   . Diabetes Paternal Aunt        paternal Aunts  . Heart disease Paternal Aunt   . Thyroid disease Neg Hx     Review of Systems     Objective:  There were no vitals filed for this visit. BP Readings from Last 3 Encounters:  03/22/20 (!) 151/84  02/18/20 (!) 163/94  01/15/20 (!) 172/96   Wt Readings from Last 3 Encounters:  01/15/20 95 lb (43.1 kg)  11/18/19 95 lb 4.8 oz (43.2 kg)  11/03/19 105 lb (47.6 kg)   There is no height or weight on file to calculate BMI.   Physical Exam    Constitutional: Appears well-developed and well-nourished. No distress.  HENT:  Head: Normocephalic and atraumatic.  Neck: Neck supple. No tracheal deviation present. No thyromegaly present.  No cervical lymphadenopathy Cardiovascular: Normal rate, regular rhythm and normal heart sounds.   No murmur heard. No carotid bruit .  No edema Pulmonary/Chest: Effort normal and breath sounds normal. No respiratory distress. No has no wheezes. No rales.  Skin: Skin is warm and dry. Not diaphoretic.  Psychiatric: Normal mood and affect. Behavior is normal.      Assessment & Plan:    See Problem List for Assessment and Plan of chronic medical problems.    This visit occurred during the SARS-CoV-2 public health emergency.  Safety protocols were in place, including screening questions prior to the visit, additional usage of staff PPE, and extensive cleaning of exam room while observing appropriate contact time as indicated for disinfecting solutions.    This encounter was created in error - please disregard.

## 2020-04-18 NOTE — Patient Instructions (Signed)
  Blood work was ordered.     Medications reviewed and updated.  Changes include :     Your prescription(s) have been submitted to your pharmacy. Please take as directed and contact our office if you believe you are having problem(s) with the medication(s).  A referral was ordered for        Someone from their office will call you to schedule an appointment.    Please followup in 6 months   

## 2020-04-19 ENCOUNTER — Encounter: Payer: Medicare Other | Admitting: Internal Medicine

## 2020-04-21 DIAGNOSIS — I119 Hypertensive heart disease without heart failure: Secondary | ICD-10-CM | POA: Diagnosis not present

## 2020-04-21 DIAGNOSIS — Z89612 Acquired absence of left leg above knee: Secondary | ICD-10-CM | POA: Diagnosis not present

## 2020-04-21 DIAGNOSIS — K219 Gastro-esophageal reflux disease without esophagitis: Secondary | ICD-10-CM | POA: Diagnosis not present

## 2020-04-21 DIAGNOSIS — Z7901 Long term (current) use of anticoagulants: Secondary | ICD-10-CM | POA: Diagnosis not present

## 2020-04-21 DIAGNOSIS — I480 Paroxysmal atrial fibrillation: Secondary | ICD-10-CM | POA: Diagnosis not present

## 2020-04-21 DIAGNOSIS — Z87891 Personal history of nicotine dependence: Secondary | ICD-10-CM | POA: Diagnosis not present

## 2020-04-21 DIAGNOSIS — T8789 Other complications of amputation stump: Secondary | ICD-10-CM | POA: Diagnosis not present

## 2020-04-21 DIAGNOSIS — Q0700 Arnold-Chiari syndrome without spina bifida or hydrocephalus: Secondary | ICD-10-CM | POA: Diagnosis not present

## 2020-04-21 DIAGNOSIS — Z950 Presence of cardiac pacemaker: Secondary | ICD-10-CM | POA: Diagnosis not present

## 2020-04-21 DIAGNOSIS — M3500 Sicca syndrome, unspecified: Secondary | ICD-10-CM | POA: Diagnosis not present

## 2020-04-21 DIAGNOSIS — E785 Hyperlipidemia, unspecified: Secondary | ICD-10-CM | POA: Diagnosis not present

## 2020-04-21 DIAGNOSIS — I2510 Atherosclerotic heart disease of native coronary artery without angina pectoris: Secondary | ICD-10-CM | POA: Diagnosis not present

## 2020-04-21 DIAGNOSIS — M329 Systemic lupus erythematosus, unspecified: Secondary | ICD-10-CM | POA: Diagnosis not present

## 2020-04-21 DIAGNOSIS — D649 Anemia, unspecified: Secondary | ICD-10-CM | POA: Diagnosis not present

## 2020-04-21 DIAGNOSIS — M069 Rheumatoid arthritis, unspecified: Secondary | ICD-10-CM | POA: Diagnosis not present

## 2020-04-21 DIAGNOSIS — I739 Peripheral vascular disease, unspecified: Secondary | ICD-10-CM | POA: Diagnosis not present

## 2020-04-21 DIAGNOSIS — I252 Old myocardial infarction: Secondary | ICD-10-CM | POA: Diagnosis not present

## 2020-04-28 DIAGNOSIS — I119 Hypertensive heart disease without heart failure: Secondary | ICD-10-CM | POA: Diagnosis not present

## 2020-04-28 DIAGNOSIS — Z87891 Personal history of nicotine dependence: Secondary | ICD-10-CM | POA: Diagnosis not present

## 2020-04-28 DIAGNOSIS — T8789 Other complications of amputation stump: Secondary | ICD-10-CM | POA: Diagnosis not present

## 2020-04-28 DIAGNOSIS — D649 Anemia, unspecified: Secondary | ICD-10-CM | POA: Diagnosis not present

## 2020-04-28 DIAGNOSIS — Z89612 Acquired absence of left leg above knee: Secondary | ICD-10-CM | POA: Diagnosis not present

## 2020-04-28 DIAGNOSIS — E785 Hyperlipidemia, unspecified: Secondary | ICD-10-CM | POA: Diagnosis not present

## 2020-04-28 DIAGNOSIS — Q0700 Arnold-Chiari syndrome without spina bifida or hydrocephalus: Secondary | ICD-10-CM | POA: Diagnosis not present

## 2020-04-28 DIAGNOSIS — I739 Peripheral vascular disease, unspecified: Secondary | ICD-10-CM | POA: Diagnosis not present

## 2020-04-28 DIAGNOSIS — Z950 Presence of cardiac pacemaker: Secondary | ICD-10-CM | POA: Diagnosis not present

## 2020-04-28 DIAGNOSIS — I480 Paroxysmal atrial fibrillation: Secondary | ICD-10-CM | POA: Diagnosis not present

## 2020-04-28 DIAGNOSIS — Z7901 Long term (current) use of anticoagulants: Secondary | ICD-10-CM | POA: Diagnosis not present

## 2020-04-28 DIAGNOSIS — M3500 Sicca syndrome, unspecified: Secondary | ICD-10-CM | POA: Diagnosis not present

## 2020-04-28 DIAGNOSIS — I2510 Atherosclerotic heart disease of native coronary artery without angina pectoris: Secondary | ICD-10-CM | POA: Diagnosis not present

## 2020-04-28 DIAGNOSIS — I252 Old myocardial infarction: Secondary | ICD-10-CM | POA: Diagnosis not present

## 2020-04-28 DIAGNOSIS — K219 Gastro-esophageal reflux disease without esophagitis: Secondary | ICD-10-CM | POA: Diagnosis not present

## 2020-04-28 DIAGNOSIS — M069 Rheumatoid arthritis, unspecified: Secondary | ICD-10-CM | POA: Diagnosis not present

## 2020-04-28 DIAGNOSIS — M329 Systemic lupus erythematosus, unspecified: Secondary | ICD-10-CM | POA: Diagnosis not present

## 2020-04-29 ENCOUNTER — Ambulatory Visit (INDEPENDENT_AMBULATORY_CARE_PROVIDER_SITE_OTHER): Payer: Medicare Other

## 2020-04-29 DIAGNOSIS — Z0000 Encounter for general adult medical examination without abnormal findings: Secondary | ICD-10-CM

## 2020-04-29 NOTE — Progress Notes (Signed)
I connected with [name] today by telephone and verified that I am speaking with the correct person using two identifiers. Location patient: home Location provider: work Persons participating in the virtual visit: Chakira Peroni and [employer]. Soriyah Osberg, LPN.   I discussed the limitations, risks, security and privacy concerns of performing an evaluation and management service by telephone and the availability of in person appointments. I also discussed with the patient that there may be a patient responsible charge related to this service. The patient expressed understanding and verbally consented to this telephonic visit.    Interactive audio and video telecommunications were attempted between this provider and patient, however failed, due to patient having technical difficulties OR patient did not have access to video capability.  We continued and completed visit with audio only.  Some vital signs may be absent or patient reported.   Time Spent with patient on telephone encounter: 20 minutes  Subjective:   Mercedes [name] is a 64 y.o. female who presents for an Initial Medicare Annual Wellness Visit.  Review of Systems    No ROS. Medicare Wellness Visit. Cardiac Risk Factors include: advanced age (>68men, >74 women);diabetes mellitus;family history of premature cardiovascular disease;hypertension     Objective:    There were no vitals filed for this visit. There is no height or weight on file to calculate BMI.  Advanced Directives 04/29/2020 11/21/2019 11/03/2019 09/26/2019 09/25/2019 09/23/2019 07/16/2019  Does Patient Have a Medical Advance Directive? No No No - No No No  Would patient like information on creating a medical advance directive? No - Patient declined No - Patient declined No - Patient declined No - Patient declined No - Patient declined No - Patient declined No - Patient declined  Pre-existing out of facility DNR order (yellow form or pink MOST form) - - - - - - -     Current Medications (verified) Outpatient Encounter Medications as of 04/29/2020  Medication Sig  . acetaminophen (TYLENOL) 500 MG tablet Take 500-1,000 mg by mouth every 8 (eight) hours as needed for mild pain or headache.  [name] ELIQUIS 5 MG TABS tablet TAKE 1 TABLET BY MOUTH TWICE DAILY (Patient taking differently: Take 5 mg by mouth 2 (two) times daily. )  . Ensure Max Protein (ENSURE MAX PROTEIN) LIQD Take 8 oz by mouth 2 (two) times daily after a meal.   . metoprolol tartrate (LOPRESSOR) 25 MG tablet Take 0.5 tablets (12.5 mg total) by mouth 2 (two) times daily.  . [DISCONTINUED] mirtazapine (REMERON) 15 MG tablet Take 1 tablet (15 mg total) by mouth at bedtime.  . [DISCONTINUED] Multiple Vitamin (MULTIVITAMIN WITH MINERALS) TABS tablet Take 1 tablet by mouth daily. (Patient taking differently: Take 1 tablet by mouth 3 (three) times a week. )  . [DISCONTINUED] nitroGLYCERIN (NITROSTAT) 0.4 MG SL tablet PLACE AND DISSOLVE 1 TABLET UNDER THE TONGUE EVERY 5 MINUTES AS NEEDED FOR CHEST PAIN (Patient taking differently: Place 0.4 mg under the tongue every 5 (five) minutes as needed for chest pain. )  . [DISCONTINUED] oxyCODONE (ROXICODONE) 5 MG immediate release tablet Take 1 tablet (5 mg total) by mouth every 6 (six) hours as needed for severe pain.  . [DISCONTINUED] rosuvastatin (CRESTOR) 40 MG tablet Take 1 tablet (40 mg total) by mouth daily.   No facility-administered encounter medications on file as of 04/29/2020.    Allergies (verified) Brilinta [ticagrelor], Latex, and Tape   History: Past Medical History:  Diagnosis Date  . Abnormal LFTs    a.  in the past when on Lipitor  . Anemia    as a young woman  . Anxiety   . Arnold-Chiari malformation, type I (HCC)    MRI brain 02/2010  . CAD (coronary artery disease)    a. NSTEMI 07/2009: LHC - D2 40%, LAD irreg., EF 50% with apical AK (tako-tsubo CM);  b. Inf STEMI (04/2013): Tx Promus DES to CFX;  c. 08/2013 Lexi CL: No ischemia, dist  ant/ap/inf-ap infarct, EF  d. 06/2016 NSTEMI with LM disease: s/p CABG on 07/17/2016 w/ LIMA-LAD, SVG-OM, and SVG-dRCA.  [name] DEPRESSION   . Dizziness    ? CVA 01/2010 - carotid dopplers with no ICA stenosis  . GERD (gastroesophageal reflux disease)   . Headache   . 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 HYPERLIPIDEMIA   . HYPERTENSION   . MI (myocardial infarction) (HCC)   . PAD (peripheral artery disease) (HCC)    s/p L fem pop 11/2013 in setting of 1st toe osteo/gangrene  . Paroxysmal atrial fibrillation (HCC)    a. anticoagulated with Eliquis 10/2014  . 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 RA (rheumatoid arthritis) (HCC)    prior tx by Dr. Dareen Piano  . Sjogren's syndrome (HCC) 06/02/13   pt denies this (12/01/13)  . SLE (systemic lupus erythematosus) (HCC) dx 05/2013   follows with rheum Dareen Piano)  . Tachy-brady syndrome (HCC)    a. s/p STJ Leadless pacemaker 11-04-2014 Dr Johney Frame  . 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 Wears dentures   . Wears glasses    Past Surgical History:  Procedure Laterality Date  . ABDOMINAL AORTAGRAM N/A 11/24/2013   Procedure: ABDOMINAL AORTAGRAM WITH BILATERAL RUNOFF WITH POSSIBLE INTERVENTION;  Surgeon: Chuck Hint, MD;  Location: [facility] CATH LAB;  Service: Cardiovascular;  Laterality: N/A;  . ABDOMINAL AORTOGRAM W/LOWER EXTREMITY N/A 04/25/2019   Procedure: ABDOMINAL AORTOGRAM W/LOWER EXTREMITY;  Surgeon: Sherren Kerns, MD;  Location: [facility] INVASIVE CV LAB;  Service: Cardiovascular;  Laterality: N/A;  . ABDOMINAL AORTOGRAM W/LOWER EXTREMITY Bilateral 05/15/2019   Procedure: ABDOMINAL AORTOGRAM W/LOWER EXTREMITY;  Surgeon: Nada Libman, MD;  Location: [facility] INVASIVE CV LAB;  Service: Cardiovascular;  Laterality: Bilateral;  . AMPUTATION Left 12/02/2013   Procedure: LEFT 1ST TOE AMPUTATION;  Surgeon: Sherren Kerns, MD;  Location: [facility] OR;  Service: Vascular;  Laterality: Left;  . AMPUTATION Left 07/16/2019   Procedure: LEFT TRANSMETATARSAL AMPUTATION;  Surgeon: Nadara Mustard, MD;  Location: [facility] OR;  Service: Orthopedics;  Laterality: Left;  . AMPUTATION Left 10/01/2019   Procedure: LEFT AMPUTATION ABOVE KNEE;  Surgeon: Sherren Kerns, MD;  Location: [facility] OR;  Service: Vascular;  Laterality: Left;  . APPLICATION OF WOUND VAC Left 11/03/2019   Procedure: Application Of Wound Vac, left lower extremity;  Surgeon: Sherren Kerns, MD;  Location: [facility] OR;  Service: Vascular;  Laterality: Left;  . CARDIAC CATHETERIZATION N/A 07/12/2016   Procedure: Left Heart Cath and Coronary Angiography;  Surgeon: Lennette Bihari, MD;  Location: [facility] INVASIVE CV LAB;  Service: Cardiovascular;  Laterality: N/A;  . CARDIAC CATHETERIZATION N/A 07/14/2016   Procedure: Intravascular Ultrasound/IVUS;  Surgeon: Yvonne Kendall, MD;  Location: [facility] INVASIVE CV LAB;  Service: Cardiovascular;  Laterality: N/A;  . COLONOSCOPY    . CORONARY ANGIOPLASTY  04/2013  . CORONARY ARTERY BYPASS GRAFT N/A 07/17/2016   Procedure: CORONARY ARTERY  BYPASS GRAFTING (CABG)x3 using right greater saphenous vein harvested endoscopiclly and left internal mammary artery;  Surgeon: Kerin Perna, MD;  Location: [facility] OR;  Service: Open Heart Surgery;  Laterality: N/A;  . ENDARTERECTOMY FEMORAL Left 05/12/2019   Procedure: LEFT Femoral Endarterectomy;  Surgeon: Sherren Kerns, MD;  Location: [facility] OR;  Service: Vascular;  Laterality: Left;  . FEMORAL-POPLITEAL BYPASS GRAFT Left 12/02/2013   Procedure:  LEFT FEMORAL-BELOW KNEE POPLITEAL ARTERY BYPASS GRAFT;  Surgeon: Sherren Kerns, MD;  Location: [facility] OR;  Service: Vascular;  Laterality: Left;  . FEMORAL-POPLITEAL BYPASS GRAFT Left 09/25/2019   Procedure: THROMBECTOMY and PROPATEN BYPASS  FEMORAL TO BELOW KNEE POPLITEAL ARTERY BYPASS GRAFT LEFT LEG;  Surgeon: Larina Earthly, MD;  Location: [facility] OR;  Service: Vascular;   Laterality: Left;  . LEFT HEART CATHETERIZATION WITH CORONARY ANGIOGRAM N/A 05/19/2013   Procedure: LEFT HEART CATHETERIZATION WITH CORONARY ANGIOGRAM;  Surgeon: Micheline Chapman, MD;  Location: [facility] CATH LAB;  Service: Cardiovascular;  Laterality: N/A;  . LEFT HEART CATHETERIZATION WITH CORONARY ANGIOGRAM N/A 10/28/2014   Procedure: LEFT HEART CATHETERIZATION WITH CORONARY ANGIOGRAM;  Surgeon: Iran Ouch, MD;  Location: [facility] CATH LAB;  Service: Cardiovascular;  Laterality: N/A;  . LOWER EXTREMITY ANGIOGRAM Left 05/12/2019   Procedure: Lower Extremity Angiogram;  Surgeon: Sherren Kerns, MD;  Location: [facility];  Service: Vascular;  Laterality: Left;  . PATCH ANGIOPLASTY Left 05/12/2019   Procedure: Left Femoral Patch Angioplasty USING CADAVERIC SAPHENOUS VEIN;  Surgeon: Sherren Kerns, MD;  Location: [facility] OR;  Service: Vascular;  Laterality: Left;  . PERCUTANEOUS CORONARY STENT INTERVENTION (PCI-S)  05/19/2013   Procedure: PERCUTANEOUS CORONARY STENT INTERVENTION (PCI-S);  Surgeon: Micheline Chapman, MD;  Location: [facility] CATH LAB;  Service: Cardiovascular;;  . PERMANENT PACEMAKER INSERTION N/A 11/04/2014   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  . TEE WITHOUT CARDIOVERSION N/A 07/17/2016   Procedure: TRANSESOPHAGEAL ECHOCARDIOGRAM (TEE);  Surgeon: Kerin Perna, MD;  Location: [facility] OR;  Service: Open Heart Surgery;  Laterality: N/A;  . THROMBECTOMY FEMORAL ARTERY Left 05/12/2019   Procedure: Left Ilio-Femoral Artery and Femoral-popliteal Graft Thrombectomy;  Surgeon: Sherren Kerns, MD;  Location: [facility] OR;  Service: Vascular;  Laterality: Left;  . TUBAL LIGATION    . VISCERAL ANGIOGRAPHY N/A 04/25/2019   Procedure: MESENTRIC  ANGIOGRAPHY;  Surgeon: Sherren Kerns, MD;  Location: [facility] INVASIVE CV LAB;  Service: Cardiovascular;  Laterality: N/A;  . WOUND DEBRIDEMENT Left 11/03/2019   Procedure: Debridement Wound of left above the knee amputation;  Surgeon: Sherren Kerns, MD;   Location: [facility] OR;  Service: Vascular;  Laterality: Left;   Family History  Problem Relation Age of Onset  . Arthritis Mother   . Emphysema Mother   . Arthritis Father   . COPD Father   . Heart disease Father   . Diabetes Paternal Aunt        paternal Aunts  . Heart disease Paternal Aunt   . Thyroid disease Neg Hx    Social History   Socioeconomic History  . Marital status: Single    Spouse name: Not on file  . Number of children: 3  . Years of education: Not on file  . Highest education level: Not on file  Occupational History  . Occupation: retired  Tobacco Use  . Smoking status: Former Smoker    Types: Cigarettes    Quit date: 2019    Years since quitting: 2.6  . Smokeless tobacco: Never  Used  . Tobacco comment:    Vaping Use  . Vaping Use: Never used  Substance and Sexual Activity  . Alcohol use: No    Alcohol/week: 0.0 standard drinks  . Drug use: Not Currently    Types: Marijuana, Other-see comments    Comment:  and CBD oil  last use of both was 10/29/19  . Sexual activity: Not on file  Other Topics Concern  . Not on file  Social History Narrative   Lives alone.   Social Determinants of Health   Financial Resource Strain: Low Risk   . Difficulty of Paying Living Expenses: Not hard at all  Food Insecurity: No Food Insecurity  . Worried About Programme researcher, broadcasting/film/video in the Last Year: Never true  . Ran Out of Food in the Last Year: Never true  Transportation Needs: No Transportation Needs  . Lack of Transportation (Medical): No  . Lack of Transportation (Non-Medical): No  Physical Activity: Inactive  . Days of Exercise per Week: 0 days  . Minutes of Exercise per Session: 0 min  Stress: No Stress Concern Present  . Feeling of Stress : Not at all  Social Connections: Unknown  . Frequency of Communication with Friends and Family: More than three times a week  . Frequency of Social Gatherings with Friends and Family: Once a week  . Attends Religious Services:  Patient refused  . Active Member of Clubs or Organizations: Patient refused  . Attends Banker Meetings: Patient refused  . Marital Status: Never married    Tobacco Counseling Counseling given: Not Answered Comment:     Clinical Intake:  Pre-visit preparation completed: Yes  Pain : No/denies pain     Nutritional Risks: None Diabetes: Yes CBG done?: No Did pt. bring in CBG monitor from home?: No  How often do you need to have someone help you when you read instructions, pamphlets, or other written materials from your doctor or pharmacy?: 1 - Never What is the last grade level you completed in school?: HSG  Diabetic? yes  Interpreter Needed?: No  Information entered by :: Susie Cassette, LPN   Activities of Daily Living In your present state of health, do you have any difficulty performing the following activities: 04/29/2020 11/21/2019  Hearing? N N  Vision? N N  Difficulty concentrating or making decisions? N N  Walking or climbing stairs? Y Y  Dressing or bathing? N Y  Comment - -  Doing errands, shopping? N N  Preparing Food and eating ? N -  Using the Toilet? N -  In the past six months, have you accidently leaked urine? N -  Do you have problems with loss of bowel control? N -  Managing your Medications? N -  Managing your Finances? N -  Housekeeping or managing your Housekeeping? N -  Some recent data might be hidden    Patient Care Team: Pincus Sanes, MD as PCP - General (Internal Medicine) Jens Som Madolyn Frieze, MD as PCP - Cardiology (Cardiology) Hillis Range, MD as PCP - Electrophysiology (Cardiology) Jens Som Madolyn Frieze, MD (Cardiology) Sherren Kerns, MD (Vascular Surgery)  Indicate any recent Medical Services you may have received from other than [facility] providers in the past year (date may be approximate).     Assessment:   This is a routine wellness examination for [place].  Hearing/Vision screen No exam data present  Dietary  issues and exercise activities discussed: Current Exercise Habits: The patient does not participate in regular  exercise at present, Exercise limited by: cardiac condition(s);psychological condition(s);Other - see comments (rheumatoid arthirits)  Goals    .  Patient Stated (pt-stated)      My goal is to stay healthy and to get my prosthesis.      Depression Screen PHQ 2/9 Scores 04/29/2020 02/14/2019 12/08/2016  PHQ - 2 Score 0 2 0  PHQ- 9 Score - 5 -    Fall Risk Fall Risk  04/29/2020 02/14/2019 11/30/2016  Falls in the past year? 1 0 No  Number falls in past yr: 0 0 -  Injury with Fall? 1 0 -  Risk for fall due to : Other (Comment) Other (Comment) -  Risk for fall due to: Comment osteomyelitis Weight loss/weakness -  Follow up Falls evaluation completed Falls evaluation completed -    Any stairs in or around the home? No  If so, are there any without handrails? No  Home free of loose throw rugs in walkways, pet beds, electrical cords, etc? Yes  Adequate lighting in your home to reduce risk of falls? Yes   ASSISTIVE DEVICES UTILIZED TO PREVENT FALLS:  Life alert? No  Use of a cane, walker or w/c? Yes  Grab bars in the bathroom? No  Shower chair or bench in shower? No  Elevated toilet seat or a handicapped toilet? No   TIMED UP AND GO:  Was the test performed? No .  Length of time to ambulate 10 feet: 0 sec.   Gait slow and steady with assistive device  Cognitive Function: not indicated; patient is cogitatively intact.        Immunizations Immunization History  Administered Date(s) Administered  . Influenza,inj,Quad PF,6+ Mos 07/04/2013, 07/21/2013  . Td 08/29/2004  . Tdap 08/28/2013    TDAP status: Up to date Flu Vaccine status: Declined, Education has been provided regarding the importance of this vaccine but patient still declined. Advised may receive this vaccine at local pharmacy or Health Dept. Aware to provide a copy of the vaccination record if obtained from  local pharmacy or Health Dept. Verbalized acceptance and understanding. Pneumococcal vaccine status: Declined,  Education has been provided regarding the importance of this vaccine but patient still declined. Advised may receive this vaccine at local pharmacy or Health Dept. Aware to provide a copy of the vaccination record if obtained from local pharmacy or Health Dept. Verbalized acceptance and understanding.  Covid-19 vaccine status: Declined, Education has been provided regarding the importance of this vaccine but patient still declined. Advised may receive this vaccine at local pharmacy or Health Dept.or vaccine clinic. Aware to provide a copy of the vaccination record if obtained from local pharmacy or Health Dept. Verbalized acceptance and understanding.  Qualifies for Shingles Vaccine? Yes   Zostavax completed No   Shingrix Completed?: No.    Education has been provided regarding the importance of this vaccine. Patient has been advised to call insurance company to determine out of pocket expense if they have not yet received this vaccine. Advised may also receive vaccine at local pharmacy or Health Dept. Verbalized acceptance and understanding.  Screening Tests Health Maintenance  Topic Date Due  . FOOT EXAM  Never done  . URINE MICROALBUMIN  Never done  . COVID-19 Vaccine (1) Never done  . MAMMOGRAM  Never done  . COLONOSCOPY  Never done  . OPHTHALMOLOGY EXAM  07/28/2012  . DEXA SCAN  Never done  . HEMOGLOBIN A1C  04/20/2020  . PNA vac Low Risk Adult (1 of 2 -  PCV13) 10/20/2020 (Originally 01/22/2019)  . TETANUS/TDAP  08/29/2023  . Hepatitis C Screening  Completed  . INFLUENZA VACCINE  Discontinued    Health Maintenance  Health Maintenance Due  Topic Date Due  . FOOT EXAM  Never done  . URINE MICROALBUMIN  Never done  . COVID-19 Vaccine (1) Never done  . MAMMOGRAM  Never done  . COLONOSCOPY  Never done  . OPHTHALMOLOGY EXAM  07/28/2012  . DEXA SCAN  Never done  .  HEMOGLOBIN A1C  04/20/2020    Colorectal status: Patient declined. Mammogram status: Patient declined. Bone density status: Patient declined.  Lung Cancer Screening: (Low Dose CT Chest recommended if Age 49-80 years, 30 pack-year currently smoking OR have quit w/in 15years.) does not qualify.   Lung Cancer Screening Referral: no  Additional Screening:  Hepatitis C Screening: does qualify; Completed: yes  Vision Screening: Recommended annual ophthalmology exams for early detection of glaucoma and other disorders of the eye. Is the patient up to date with their annual eye exam?  No  Who is the provider or what is the name of the office in which the patient attends annual eye exams? Patient declined If pt is not established with a provider, would they like to be referred to a provider to establish care? No .   Dental Screening: Recommended annual dental exams for proper oral hygiene  Community Resource Referral / Chronic Care Management: CRR required this visit?  No   CCM required this visit?  No      Plan:     I have personally reviewed and noted the following in the patient's chart:   . Medical and social history . Use of alcohol, tobacco or illicit drugs  . Current medications and supplements . Functional ability and status . Nutritional status . Physical activity . Advanced directives . List of other physicians . Hospitalizations, surgeries, and ER visits in previous 12 months . Vitals . Screenings to include cognitive, depression, and falls . Referrals and appointments  In addition, I have reviewed and discussed with patient certain preventive protocols, quality metrics, and best practice recommendations. A written personalized care plan for preventive services as well as general preventive health recommendations were provided to patient.     Mickeal Needy, LPN   09/02/1094   Nurse Notes:  Patient is cogitatively intact. There were no vitals filed for this  visit. There is no height or weight on file to calculate BMI.

## 2020-05-04 ENCOUNTER — Ambulatory Visit: Payer: Medicare Other | Admitting: Internal Medicine

## 2020-05-04 ENCOUNTER — Other Ambulatory Visit: Payer: Self-pay | Admitting: General Practice

## 2020-05-04 DIAGNOSIS — Z0001 Encounter for general adult medical examination with abnormal findings: Secondary | ICD-10-CM | POA: Diagnosis not present

## 2020-05-04 DIAGNOSIS — M069 Rheumatoid arthritis, unspecified: Secondary | ICD-10-CM | POA: Diagnosis not present

## 2020-05-04 DIAGNOSIS — I10 Essential (primary) hypertension: Secondary | ICD-10-CM | POA: Diagnosis not present

## 2020-05-04 DIAGNOSIS — M329 Systemic lupus erythematosus, unspecified: Secondary | ICD-10-CM | POA: Diagnosis not present

## 2020-05-04 DIAGNOSIS — Z79899 Other long term (current) drug therapy: Secondary | ICD-10-CM | POA: Diagnosis not present

## 2020-05-04 DIAGNOSIS — F17200 Nicotine dependence, unspecified, uncomplicated: Secondary | ICD-10-CM

## 2020-05-04 NOTE — Patient Instructions (Addendum)
Mercedes [name] , Thank you for taking time to come for your Medicare Wellness Visit. I appreciate your ongoing commitment to your health goals. Please review the following plan we discussed and let me know if I can assist you in the future.   Screening recommendations/referrals: Colonoscopy: declined Mammogram: declined Bone Density: declined Recommended yearly ophthalmology/optometry visit for glaucoma screening and checkup Recommended yearly dental visit for hygiene and checkup  Vaccinations: Influenza vaccine: declined Pneumococcal vaccine: declined Tdap vaccine: declined Shingles vaccine: declined   Covid-19: declined  Advanced directives: Advance directive discussed with you today. Even though you declined this today please call our office should you change your mind and we can give you the proper paperwork for you to fill out.  Conditions/risks identified: Yes. Reviewed health maintenance screenings with patient today and relevant education, vaccines, and/or referrals were provided. Continue doing brain stimulating activities (puzzles, reading, adult coloring books, staying active) to keep memory sharp. Continue to eat heart healthy diet (full of fruits, vegetables, whole grains, lean protein, water--limit salt, fat, and sugar intake) and increase physical activity as tolerated.  Next appointment: Please schedule your next Medicare Wellness Visit with your Nurse Health Advisor in 1 year.   Preventive Care 25 Years and Older, Female Preventive care refers to lifestyle choices and visits with your health care provider that can promote health and wellness. What does preventive care include?  A yearly physical exam. This is also called an annual well check.  Dental exams once or twice a year.  Routine eye exams. Ask your health care provider how often you should have your eyes checked.  Personal lifestyle choices, including:  Daily care of your teeth and gums.  Regular physical  activity.  Eating a healthy diet.  Avoiding tobacco and drug use.  Limiting alcohol use.  Practicing safe sex.  Taking low-dose aspirin every day.  Taking vitamin and mineral supplements as recommended by your health care provider. What happens during an annual well check? The services and screenings done by your health care provider during your annual well check will depend on your age, overall health, lifestyle risk factors, and family history of disease. Counseling  Your health care provider may ask you questions about your:  Alcohol use.  Tobacco use.  Drug use.  Emotional well-being.  Home and relationship well-being.  Sexual activity.  Eating habits.  History of falls.  Memory and ability to understand (cognition).  Work and work Astronomer.  Reproductive health. Screening  You may have the following tests or measurements:  Height, weight, and BMI.  Blood pressure.  Lipid and cholesterol levels. These may be checked every 5 years, or more frequently if you are over 29 years old.  Skin check.  Lung cancer screening. You may have this screening every year starting at age 53 if you have a 30-pack-year history of smoking and currently smoke or have quit within the past 15 years.  Fecal occult blood test (FOBT) of the stool. You may have this test every year starting at age 93.  Flexible sigmoidoscopy or colonoscopy. You may have a sigmoidoscopy every 5 years or a colonoscopy every 10 years starting at age 66.  Hepatitis C blood test.  Hepatitis B blood test.  Sexually transmitted disease (STD) testing.  Diabetes screening. This is done by checking your blood sugar (glucose) after you have not eaten for a while (fasting). You may have this done every 1-3 years.  Bone density scan. This is done to screen for osteoporosis. You  may have this done starting at age 4.  Mammogram. This may be done every 1-2 years. Talk to your health care provider about  how often you should have regular mammograms. Talk with your health care provider about your test results, treatment options, and if necessary, the need for more tests. Vaccines  Your health care provider may recommend certain vaccines, such as:  Influenza vaccine. This is recommended every year.  Tetanus, diphtheria, and acellular pertussis (Tdap, Td) vaccine. You may need a Td booster every 10 years.  Zoster vaccine. You may need this after age 47.  Pneumococcal 13-valent conjugate (PCV13) vaccine. One dose is recommended after age 18.  Pneumococcal polysaccharide (PPSV23) vaccine. One dose is recommended after age 68. Talk to your health care provider about which screenings and vaccines you need and how often you need them. This information is not intended to replace advice given to you by your health care provider. Make sure you discuss any questions you have with your health care provider. Document Released: 09/10/2015 Document Revised: 05/03/2016 Document Reviewed: 06/15/2015 Elsevier Interactive Patient Education  2017 [place] Prevention in the Home Falls can cause injuries. They can happen to people of all ages. There are many things you can do to make your home safe and to help prevent falls. What can I do on the outside of my home?  Regularly fix the edges of walkways and driveways and fix any cracks.  Remove anything that might make you trip as you walk through a door, such as a raised step or threshold.  Trim any bushes or trees on the path to your home.  Use bright outdoor lighting.  Clear any walking paths of anything that might make someone trip, such as rocks or tools.  Regularly check to see if handrails are loose or broken. Make sure that both sides of any steps have handrails.  Any raised decks and porches should have guardrails on the edges.  Have any leaves, snow, or ice cleared regularly.  Use sand or salt on walking paths during  winter.  Clean up any spills in your garage right away. This includes oil or grease spills. What can I do in the bathroom?  Use night lights.  Install grab bars by the toilet and in the tub and shower. Do not use towel bars as grab bars.  Use non-skid mats or decals in the tub or shower.  If you need to sit down in the shower, use a plastic, non-slip stool.  Keep the floor dry. Clean up any water that spills on the floor as soon as it happens.  Remove soap buildup in the tub or shower regularly.  Attach bath mats securely with double-sided non-slip rug tape.  Do not have throw rugs and other things on the floor that can make you trip. What can I do in the bedroom?  Use night lights.  Make sure that you have a light by your bed that is easy to reach.  Do not use any sheets or blankets that are too big for your bed. They should not hang down onto the floor.  Have a firm chair that has side arms. You can use this for support while you get dressed.  Do not have throw rugs and other things on the floor that can make you trip. What can I do in the kitchen?  Clean up any spills right away.  Avoid walking on wet floors.  Keep items that you use a lot  in easy-to-reach places.  If you need to reach something above you, use a strong step stool that has a grab bar.  Keep electrical cords out of the way.  Do not use floor polish or wax that makes floors slippery. If you must use wax, use non-skid floor wax.  Do not have throw rugs and other things on the floor that can make you trip. What can I do with my stairs?  Do not leave any items on the stairs.  Make sure that there are handrails on both sides of the stairs and use them. Fix handrails that are broken or loose. Make sure that handrails are as long as the stairways.  Check any carpeting to make sure that it is firmly attached to the stairs. Fix any carpet that is loose or worn.  Avoid having throw rugs at the top or  bottom of the stairs. If you do have throw rugs, attach them to the floor with carpet tape.  Make sure that you have a light switch at the top of the stairs and the bottom of the stairs. If you do not have them, ask someone to add them for you. What else can I do to help prevent falls?  Wear shoes that:  Do not have high heels.  Have rubber bottoms.  Are comfortable and fit you well.  Are closed at the toe. Do not wear sandals.  If you use a stepladder:  Make sure that it is fully opened. Do not climb a closed stepladder.  Make sure that both sides of the stepladder are locked into place.  Ask someone to hold it for you, if possible.  Clearly mark and make sure that you can see:  Any grab bars or handrails.  First and last steps.  Where the edge of each step is.  Use tools that help you move around (mobility aids) if they are needed. These include:  Canes.  Walkers.  Scooters.  Crutches.  Turn on the lights when you go into a dark area. Replace any light bulbs as soon as they burn out.  Set up your furniture so you have a clear path. Avoid moving your furniture around.  If any of your floors are uneven, fix them.  If there are any pets around you, be aware of where they are.  Review your medicines with your doctor. Some medicines can make you feel dizzy. This can increase your chance of falling. Ask your doctor what other things that you can do to help prevent falls. This information is not intended to replace advice given to you by your health care provider. Make sure you discuss any questions you have with your health care provider. Document Released: 06/10/2009 Document Revised: 01/20/2016 Document Reviewed: 09/18/2014 Elsevier Interactive Patient Education  2017 [employer].

## 2020-05-06 ENCOUNTER — Other Ambulatory Visit: Payer: Self-pay | Admitting: General Practice

## 2020-05-06 DIAGNOSIS — Z780 Asymptomatic menopausal state: Secondary | ICD-10-CM

## 2020-05-06 DIAGNOSIS — Q0700 Arnold-Chiari syndrome without spina bifida or hydrocephalus: Secondary | ICD-10-CM | POA: Diagnosis not present

## 2020-05-06 DIAGNOSIS — I2510 Atherosclerotic heart disease of native coronary artery without angina pectoris: Secondary | ICD-10-CM | POA: Diagnosis not present

## 2020-05-06 DIAGNOSIS — I252 Old myocardial infarction: Secondary | ICD-10-CM | POA: Diagnosis not present

## 2020-05-06 DIAGNOSIS — Z87891 Personal history of nicotine dependence: Secondary | ICD-10-CM | POA: Diagnosis not present

## 2020-05-06 DIAGNOSIS — I119 Hypertensive heart disease without heart failure: Secondary | ICD-10-CM | POA: Diagnosis not present

## 2020-05-06 DIAGNOSIS — M069 Rheumatoid arthritis, unspecified: Secondary | ICD-10-CM | POA: Diagnosis not present

## 2020-05-06 DIAGNOSIS — I739 Peripheral vascular disease, unspecified: Secondary | ICD-10-CM | POA: Diagnosis not present

## 2020-05-06 DIAGNOSIS — D649 Anemia, unspecified: Secondary | ICD-10-CM | POA: Diagnosis not present

## 2020-05-06 DIAGNOSIS — K219 Gastro-esophageal reflux disease without esophagitis: Secondary | ICD-10-CM | POA: Diagnosis not present

## 2020-05-06 DIAGNOSIS — Z89612 Acquired absence of left leg above knee: Secondary | ICD-10-CM | POA: Diagnosis not present

## 2020-05-06 DIAGNOSIS — E785 Hyperlipidemia, unspecified: Secondary | ICD-10-CM | POA: Diagnosis not present

## 2020-05-06 DIAGNOSIS — Z950 Presence of cardiac pacemaker: Secondary | ICD-10-CM | POA: Diagnosis not present

## 2020-05-06 DIAGNOSIS — M329 Systemic lupus erythematosus, unspecified: Secondary | ICD-10-CM | POA: Diagnosis not present

## 2020-05-06 DIAGNOSIS — Z1231 Encounter for screening mammogram for malignant neoplasm of breast: Secondary | ICD-10-CM

## 2020-05-06 DIAGNOSIS — M3500 Sicca syndrome, unspecified: Secondary | ICD-10-CM | POA: Diagnosis not present

## 2020-05-06 DIAGNOSIS — I480 Paroxysmal atrial fibrillation: Secondary | ICD-10-CM | POA: Diagnosis not present

## 2020-05-06 DIAGNOSIS — T8789 Other complications of amputation stump: Secondary | ICD-10-CM | POA: Diagnosis not present

## 2020-05-06 DIAGNOSIS — Z7901 Long term (current) use of anticoagulants: Secondary | ICD-10-CM | POA: Diagnosis not present

## 2020-05-10 ENCOUNTER — Encounter: Payer: Medicare Other | Admitting: Internal Medicine

## 2020-05-11 ENCOUNTER — Encounter: Payer: Self-pay | Admitting: *Deleted

## 2020-05-11 ENCOUNTER — Ambulatory Visit: Payer: Medicare Other | Admitting: Internal Medicine

## 2020-05-11 DIAGNOSIS — Z89612 Acquired absence of left leg above knee: Secondary | ICD-10-CM | POA: Diagnosis not present

## 2020-05-11 DIAGNOSIS — M329 Systemic lupus erythematosus, unspecified: Secondary | ICD-10-CM | POA: Diagnosis not present

## 2020-05-11 DIAGNOSIS — M069 Rheumatoid arthritis, unspecified: Secondary | ICD-10-CM | POA: Diagnosis not present

## 2020-05-11 DIAGNOSIS — I480 Paroxysmal atrial fibrillation: Secondary | ICD-10-CM | POA: Diagnosis not present

## 2020-05-11 DIAGNOSIS — T8789 Other complications of amputation stump: Secondary | ICD-10-CM | POA: Diagnosis not present

## 2020-05-11 DIAGNOSIS — Z87891 Personal history of nicotine dependence: Secondary | ICD-10-CM | POA: Diagnosis not present

## 2020-05-11 DIAGNOSIS — Z950 Presence of cardiac pacemaker: Secondary | ICD-10-CM | POA: Diagnosis not present

## 2020-05-11 DIAGNOSIS — E785 Hyperlipidemia, unspecified: Secondary | ICD-10-CM | POA: Diagnosis not present

## 2020-05-11 DIAGNOSIS — I252 Old myocardial infarction: Secondary | ICD-10-CM | POA: Diagnosis not present

## 2020-05-11 DIAGNOSIS — Z7901 Long term (current) use of anticoagulants: Secondary | ICD-10-CM | POA: Diagnosis not present

## 2020-05-11 DIAGNOSIS — I119 Hypertensive heart disease without heart failure: Secondary | ICD-10-CM | POA: Diagnosis not present

## 2020-05-11 DIAGNOSIS — I2510 Atherosclerotic heart disease of native coronary artery without angina pectoris: Secondary | ICD-10-CM | POA: Diagnosis not present

## 2020-05-11 DIAGNOSIS — I739 Peripheral vascular disease, unspecified: Secondary | ICD-10-CM | POA: Diagnosis not present

## 2020-05-11 DIAGNOSIS — K219 Gastro-esophageal reflux disease without esophagitis: Secondary | ICD-10-CM | POA: Diagnosis not present

## 2020-05-11 DIAGNOSIS — Z006 Encounter for examination for normal comparison and control in clinical research program: Secondary | ICD-10-CM

## 2020-05-11 DIAGNOSIS — D649 Anemia, unspecified: Secondary | ICD-10-CM | POA: Diagnosis not present

## 2020-05-11 DIAGNOSIS — M3500 Sicca syndrome, unspecified: Secondary | ICD-10-CM | POA: Diagnosis not present

## 2020-05-11 DIAGNOSIS — Q0700 Arnold-Chiari syndrome without spina bifida or hydrocephalus: Secondary | ICD-10-CM | POA: Diagnosis not present

## 2020-05-11 NOTE — Research (Signed)
Patient did not come to this 66 month visit d/t lack of a caretaker to bring her to Dr. Jenel Lucks office. She was rescheduled for 05-31-2020.

## 2020-05-17 ENCOUNTER — Ambulatory Visit: Payer: Medicare Other

## 2020-05-17 DIAGNOSIS — D8989 Other specified disorders involving the immune mechanism, not elsewhere classified: Secondary | ICD-10-CM | POA: Diagnosis not present

## 2020-05-17 DIAGNOSIS — I739 Peripheral vascular disease, unspecified: Secondary | ICD-10-CM | POA: Diagnosis not present

## 2020-05-17 DIAGNOSIS — Z8739 Personal history of other diseases of the musculoskeletal system and connective tissue: Secondary | ICD-10-CM | POA: Diagnosis not present

## 2020-05-17 DIAGNOSIS — R768 Other specified abnormal immunological findings in serum: Secondary | ICD-10-CM | POA: Diagnosis not present

## 2020-05-20 ENCOUNTER — Encounter: Payer: Self-pay | Admitting: *Deleted

## 2020-05-20 ENCOUNTER — Encounter: Payer: Self-pay | Admitting: Vascular Surgery

## 2020-05-20 ENCOUNTER — Other Ambulatory Visit: Payer: Self-pay | Admitting: *Deleted

## 2020-05-20 ENCOUNTER — Other Ambulatory Visit (HOSPITAL_COMMUNITY): Payer: Self-pay | Admitting: Vascular Surgery

## 2020-05-20 ENCOUNTER — Other Ambulatory Visit: Payer: Self-pay

## 2020-05-20 ENCOUNTER — Ambulatory Visit (INDEPENDENT_AMBULATORY_CARE_PROVIDER_SITE_OTHER): Payer: Medicare Other | Admitting: Vascular Surgery

## 2020-05-20 ENCOUNTER — Ambulatory Visit (HOSPITAL_COMMUNITY)
Admission: RE | Admit: 2020-05-20 | Discharge: 2020-05-20 | Disposition: A | Discharge status: Home / Self Care | Payer: Medicare Other | Source: Ambulatory Visit | Attending: Vascular Surgery | Admitting: Vascular Surgery

## 2020-05-20 VITALS — BP 162/87 | HR 80 | Temp 97.8°F | Resp 20 | Ht 66.0 in

## 2020-05-20 DIAGNOSIS — M79604 Pain in right leg: Secondary | ICD-10-CM | POA: Diagnosis not present

## 2020-05-20 DIAGNOSIS — I739 Peripheral vascular disease, unspecified: Secondary | ICD-10-CM

## 2020-05-20 NOTE — Progress Notes (Signed)
Patient is a 64 year old female who returns for follow-up today.  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  The above-knee amputation has now completely healed.  She thinks she is ready for prosthetic.  She is transferring well to the wheelchair.  She recently had a nail trim on her right foot and had some infection but this seems to have healed spontaneously.  She does not really complain of continuous rest pain in the right foot.  She does have some intermittent right foot pain.  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  She has quit smoking.  She is on Eliquis for atrial fibrillation.  Review of systems: She has no shortness of breath.  She has no chest pain.  Current Outpatient Medications on File Prior to Visit  Medication Sig Dispense Refill  . acetaminophen (TYLENOL) 500 MG tablet Take 500-1,000 mg by mouth every 8 (eight) hours as needed for mild pain or headache.    [name] ELIQUIS 5 MG TABS tablet TAKE 1 TABLET BY MOUTH TWICE DAILY (Patient taking differently: Take 5 mg by mouth 2 (two) times daily. ) 180 tablet 3  . Ensure Max Protein (ENSURE MAX PROTEIN) LIQD Take 8 oz by mouth 2 (two) times daily after a meal.     . metoprolol tartrate (LOPRESSOR) 25 MG tablet Take 0.5 tablets (12.5 mg total) by mouth 2 (two) times daily. 180 tablet 3   No current facility-administered medications on file prior to visit.     Physical exam:  Vitals:   05/20/20 0847  BP: (!) 162/87  Pulse: 80  Resp: 20  Temp: 97.8 F (36.6 C)  SpO2: 98%  Height: 5\' 6"  (1.676 m)    Left above-knee amputation well-healed  Right leg no palpable pedal pulses all 5 toes dusky in appearance no open wound no evidence of infection some slightly ruborous changes of the proximal foot  Data: Patient had a right leg ABI today which showed an ABI on the right side of 0.3  Assessment: Patient now with right foot dusky in appearance and  ABI of 0.3.  Since she has already had a left above-knee amputation I believe we should proceed with aortogram and right lower extremity runoff to see if she needs an intervention at this point.  Most likely it would be a diagnostic arteriogram since she only has one-vessel peroneal runoff and we would have to discuss whether or not she would want to undergo another bypass operation.  The patient wished to think about this for a few weeks and has deferred her arteriogram until October 22.  She will call me sooner if she develops rest pain in the right foot or nonhealing wound.  Plan: Aortogram lower extremity runoff June 18, 2020.  We will need to stop her Eliquis a few days prior to this.  She was also given a prescription today for a shrinker for her left above-knee amputation stump and further evaluation for prosthetic in the left leg  June 20, 2020, MD [facility] Office: [phone]   Assessment:

## 2020-05-21 ENCOUNTER — Telehealth: Payer: Self-pay

## 2020-05-21 NOTE — Telephone Encounter (Signed)
Kasey with [facility] called to inform office that patient missed her PT appt that was scheduled for yesterday. When she attempted to get the patient rescheduled to a PT session today the patient declined services. I voiced my understanding and advised I would document the call.

## 2020-05-26 DIAGNOSIS — Q0700 Arnold-Chiari syndrome without spina bifida or hydrocephalus: Secondary | ICD-10-CM | POA: Diagnosis not present

## 2020-05-26 DIAGNOSIS — I119 Hypertensive heart disease without heart failure: Secondary | ICD-10-CM | POA: Diagnosis not present

## 2020-05-26 DIAGNOSIS — I2510 Atherosclerotic heart disease of native coronary artery without angina pectoris: Secondary | ICD-10-CM | POA: Diagnosis not present

## 2020-05-26 DIAGNOSIS — I480 Paroxysmal atrial fibrillation: Secondary | ICD-10-CM | POA: Diagnosis not present

## 2020-05-26 DIAGNOSIS — M3500 Sicca syndrome, unspecified: Secondary | ICD-10-CM | POA: Diagnosis not present

## 2020-05-26 DIAGNOSIS — Z7901 Long term (current) use of anticoagulants: Secondary | ICD-10-CM | POA: Diagnosis not present

## 2020-05-26 DIAGNOSIS — Z87891 Personal history of nicotine dependence: Secondary | ICD-10-CM | POA: Diagnosis not present

## 2020-05-26 DIAGNOSIS — D649 Anemia, unspecified: Secondary | ICD-10-CM | POA: Diagnosis not present

## 2020-05-26 DIAGNOSIS — M069 Rheumatoid arthritis, unspecified: Secondary | ICD-10-CM | POA: Diagnosis not present

## 2020-05-26 DIAGNOSIS — Z89612 Acquired absence of left leg above knee: Secondary | ICD-10-CM | POA: Diagnosis not present

## 2020-05-26 DIAGNOSIS — I252 Old myocardial infarction: Secondary | ICD-10-CM | POA: Diagnosis not present

## 2020-05-26 DIAGNOSIS — Z950 Presence of cardiac pacemaker: Secondary | ICD-10-CM | POA: Diagnosis not present

## 2020-05-26 DIAGNOSIS — I739 Peripheral vascular disease, unspecified: Secondary | ICD-10-CM | POA: Diagnosis not present

## 2020-05-26 DIAGNOSIS — E785 Hyperlipidemia, unspecified: Secondary | ICD-10-CM | POA: Diagnosis not present

## 2020-05-26 DIAGNOSIS — T8789 Other complications of amputation stump: Secondary | ICD-10-CM | POA: Diagnosis not present

## 2020-05-26 DIAGNOSIS — K219 Gastro-esophageal reflux disease without esophagitis: Secondary | ICD-10-CM | POA: Diagnosis not present

## 2020-05-26 DIAGNOSIS — M329 Systemic lupus erythematosus, unspecified: Secondary | ICD-10-CM | POA: Diagnosis not present

## 2020-05-31 ENCOUNTER — Ambulatory Visit (INDEPENDENT_AMBULATORY_CARE_PROVIDER_SITE_OTHER): Payer: Medicare Other | Admitting: Internal Medicine

## 2020-05-31 ENCOUNTER — Encounter: Payer: Self-pay | Admitting: Internal Medicine

## 2020-05-31 ENCOUNTER — Other Ambulatory Visit: Payer: Self-pay

## 2020-05-31 ENCOUNTER — Encounter: Payer: Self-pay | Admitting: *Deleted

## 2020-05-31 VITALS — BP 150/80 | HR 84 | Ht 66.0 in | Wt 98.2 lb

## 2020-05-31 DIAGNOSIS — D6869 Other thrombophilia: Secondary | ICD-10-CM

## 2020-05-31 DIAGNOSIS — I480 Paroxysmal atrial fibrillation: Secondary | ICD-10-CM | POA: Diagnosis not present

## 2020-05-31 DIAGNOSIS — Z006 Encounter for examination for normal comparison and control in clinical research program: Secondary | ICD-10-CM

## 2020-05-31 DIAGNOSIS — I495 Sick sinus syndrome: Secondary | ICD-10-CM

## 2020-05-31 NOTE — Progress Notes (Signed)
PCP: Pincus Sanes, MD   Primary EP: Dr Johney Frame  Mercedes [name] is a 64 y.o. female who presents today for routine electrophysiology followup.  Since last being seen in our clinic, the patient reports doing very well.  Her primary concern is with ongoing issues with her leg healing.  Today, she denies symptoms of palpitations, chest pain, shortness of breath,  lower extremity edema, dizziness, presyncope, or syncope.  The patient is otherwise without complaint today.   Past Medical History:  Diagnosis Date  . Abnormal LFTs    a. in the past when on Lipitor  . Anemia    as a young woman  . Anxiety   . Arnold-Chiari malformation, type I (HCC)    MRI brain 02/2010  . CAD (coronary artery disease)    a. NSTEMI 07/2009: LHC - D2 40%, LAD irreg., EF 50% with apical AK (tako-tsubo CM);  b. Inf STEMI (04/2013): Tx Promus DES to CFX;  c. 08/2013 Lexi CL: No ischemia, dist ant/ap/inf-ap infarct, EF  d. 06/2016 NSTEMI with LM disease: s/p CABG on 07/17/2016 w/ LIMA-LAD, SVG-OM, and SVG-dRCA.  [name] DEPRESSION   . Dizziness    ? CVA 01/2010 - carotid dopplers with no ICA stenosis  . GERD (gastroesophageal reflux disease)   . Headache   . 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 HYPERLIPIDEMIA   . HYPERTENSION   . MI (myocardial infarction) (HCC)   . PAD (peripheral artery disease) (HCC)    s/p L fem pop 11/2013 in setting of 1st toe osteo/gangrene  . Paroxysmal atrial fibrillation (HCC)    a. anticoagulated with Eliquis 10/2014  . 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 RA (rheumatoid arthritis) (HCC)    prior tx by Dr. Dareen Piano  . Sjogren's syndrome (HCC) 06/02/13   pt denies this (12/01/13)  . SLE (systemic lupus erythematosus) (HCC) dx 05/2013   follows with rheum Dareen Piano)  . Tachy-brady syndrome (HCC)    a. s/p STJ Leadless pacemaker 11-04-2014 Dr Johney Frame  . 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 Wears dentures   . Wears glasses    Past Surgical History:  Procedure Laterality Date  . ABDOMINAL AORTAGRAM N/A 11/24/2013   Procedure: ABDOMINAL AORTAGRAM WITH BILATERAL RUNOFF WITH POSSIBLE INTERVENTION;  Surgeon: Chuck Hint, MD;  Location: [facility] CATH LAB;  Service: Cardiovascular;  Laterality: N/A;  . ABDOMINAL AORTOGRAM W/LOWER EXTREMITY N/A 04/25/2019   Procedure: ABDOMINAL AORTOGRAM W/LOWER EXTREMITY;  Surgeon: Sherren Kerns, MD;  Location: [facility] INVASIVE CV LAB;  Service: Cardiovascular;  Laterality: N/A;  . ABDOMINAL AORTOGRAM W/LOWER EXTREMITY Bilateral 05/15/2019   Procedure: ABDOMINAL AORTOGRAM W/LOWER EXTREMITY;  Surgeon: Nada Libman, MD;  Location: [facility] INVASIVE CV LAB;  Service: Cardiovascular;  Laterality: Bilateral;  . AMPUTATION Left 12/02/2013   Procedure: LEFT 1ST TOE AMPUTATION;  Surgeon: Sherren Kerns, MD;  Location: [facility] OR;  Service: Vascular;  Laterality: Left;  . AMPUTATION Left 07/16/2019   Procedure: LEFT TRANSMETATARSAL AMPUTATION;  Surgeon: Nadara Mustard, MD;  Location: [name] OR;  Service: Orthopedics;  Laterality: Left;  . AMPUTATION Left 10/01/2019   Procedure: LEFT AMPUTATION ABOVE KNEE;  Surgeon: Sherren Kerns, MD;  Location: [facility] OR;  Service: Vascular;  Laterality: Left;  . APPLICATION OF WOUND VAC Left 11/03/2019   Procedure: Application Of Wound Vac, left lower extremity;  Surgeon: Fabienne Bruns  E, MD;  Location: [facility] OR;  Service: Vascular;  Laterality: Left;  . CARDIAC CATHETERIZATION N/A 07/12/2016   Procedure: Left Heart Cath and Coronary Angiography;  Surgeon: Lennette Bihari, MD;  Location[facility] INVASIVE CV LAB;  Service: Cardiovascular;  Laterality: N/A;  . CARDIAC CATHETERIZATION N/A 07/14/2016   Procedure: Intravascular Ultrasound/IVUS;  Surgeon: Yvonne Kendall, MD;  Location: [facility] INVASIVE CV LAB;  Service: Cardiovascular;  Laterality: N/A;  . COLONOSCOPY    . CORONARY ANGIOPLASTY  04/2013  .  CORONARY ARTERY BYPASS GRAFT N/A 07/17/2016   Procedure: CORONARY ARTERY BYPASS GRAFTING (CABG)x3 using right greater saphenous vein harvested endoscopiclly and left internal mammary artery;  Surgeon: Kerin Perna, MD;  Location: [facility] OR;  Service: Open Heart Surgery;  Laterality: N/A;  . ENDARTERECTOMY FEMORAL Left 05/12/2019   Procedure: LEFT Femoral Endarterectomy;  Surgeon: Sherren Kerns, MD;  Location: [facility] OR;  Service: Vascular;  Laterality: Left;  . FEMORAL-POPLITEAL BYPASS GRAFT Left 12/02/2013   Procedure:  LEFT FEMORAL-BELOW KNEE POPLITEAL ARTERY BYPASS GRAFT;  Surgeon: Sherren Kerns, MD;  Location: [facility] OR;  Service: Vascular;  Laterality: Left;  . FEMORAL-POPLITEAL BYPASS GRAFT Left 09/25/2019   Procedure: THROMBECTOMY and PROPATEN BYPASS  FEMORAL TO BELOW KNEE POPLITEAL ARTERY BYPASS GRAFT LEFT LEG;  Surgeon: Larina Earthly, MD;  Location: [facility] OR;  Service: Vascular;  Laterality: Left;  . LEFT HEART CATHETERIZATION WITH CORONARY ANGIOGRAM N/A 05/19/2013   Procedure: LEFT HEART CATHETERIZATION WITH CORONARY ANGIOGRAM;  Surgeon: Micheline Chapman, MD;  Location: [facility] CATH LAB;  Service: Cardiovascular;  Laterality: N/A;  . LEFT HEART CATHETERIZATION WITH CORONARY ANGIOGRAM N/A 10/28/2014   Procedure: LEFT HEART CATHETERIZATION WITH CORONARY ANGIOGRAM;  Surgeon: Iran Ouch, MD;  Location: [facility] CATH LAB;  Service: Cardiovascular;  Laterality: N/A;  . LOWER EXTREMITY ANGIOGRAM Left 05/12/2019   Procedure: Lower Extremity Angiogram;  Surgeon: Sherren Kerns, MD;  Location: [facility] OR;  Service: Vascular;  Laterality: Left;  . PATCH ANGIOPLASTY Left 05/12/2019   Procedure: Left Femoral Patch Angioplasty USING CADAVERIC SAPHENOUS VEIN;  Surgeon: Sherren Kerns, MD;  Location: [facility] OR;  Service: Vascular;  Laterality: Left;  . PERCUTANEOUS CORONARY STENT INTERVENTION (PCI-S)  05/19/2013   Procedure: PERCUTANEOUS CORONARY STENT INTERVENTION (PCI-S);  Surgeon: Micheline Chapman, MD;  Location: [facility] CATH  LAB;  Service: Cardiovascular;;  . PERMANENT PACEMAKER INSERTION N/A 11/04/2014   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  . TEE WITHOUT CARDIOVERSION N/A 07/17/2016   Procedure: TRANSESOPHAGEAL ECHOCARDIOGRAM (TEE);  Surgeon: Kerin Perna, MD;  Location: [facility] OR;  Service: Open Heart Surgery;  Laterality: N/A;  . THROMBECTOMY FEMORAL ARTERY Left 05/12/2019   Procedure: Left Ilio-Femoral Artery and Femoral-popliteal Graft Thrombectomy;  Surgeon: Sherren Kerns, MD;  Location: [facility] OR;  Service: Vascular;  Laterality: Left;  . TUBAL LIGATION    . VISCERAL ANGIOGRAPHY N/A 04/25/2019   Procedure: MESENTRIC  ANGIOGRAPHY;  Surgeon: Sherren Kerns, MD;  Location: [facility] INVASIVE CV LAB;  Service: Cardiovascular;  Laterality: N/A;  . WOUND DEBRIDEMENT Left 11/03/2019   Procedure: Debridement Wound of left above the knee amputation;  Surgeon: Sherren Kerns, MD;  Location: [facility] OR;  Service: Vascular;  Laterality: Left;    ROS- all systems are reviewed and negatives except as per HPI above  Current Outpatient Medications  Medication Sig Dispense Refill  . acetaminophen (TYLENOL) 500 MG tablet Take 500-1,000 mg by mouth every 8 (eight) hours as needed for mild pain or headache.    [name]  ELIQUIS 5 MG TABS tablet TAKE 1 TABLET BY MOUTH TWICE DAILY 180 tablet 3  . Ensure Max Protein (ENSURE MAX PROTEIN) LIQD Take 8 oz by mouth 2 (two) times daily after a meal.     . metoprolol tartrate (LOPRESSOR) 25 MG tablet Take 0.5 tablets (12.5 mg total) by mouth 2 (two) times daily. 180 tablet 3  . rosuvastatin (CRESTOR) 20 MG tablet Take 20 mg by mouth daily.     No current facility-administered medications for this visit.    Physical Exam: Vitals:   05/31/20 1532  BP: (!) 150/80  Pulse: 84  SpO2: 97%  Weight: 98 lb 3.2 oz (44.5 kg)  Height: 5\' 6"  (1.676 m)    GEN- The patient is well appearing, alert and oriented x 3 today.   Head- normocephalic, atraumatic Eyes-  Sclera clear,  conjunctiva pink Ears- hearing intact Oropharynx- clear Lungs- normal work of breathing Heart- Regular rate and rhythm  GI- sof  Extremities- s/p L AKA  Wt Readings from Last 3 Encounters:  05/31/20 98 lb 3.2 oz (44.5 kg)  01/15/20 95 lb (43.1 kg)  11/18/19 95 lb 4.8 oz (43.2 kg)    EKG tracing ordered today is personally reviewed and shows sinus rhythm 84 bpm, PR 122 msec, QRS 94 msec, QTc 337 msec  Assessment and Plan:  1. Bradycardia Asymptomatic She did have battery failure with RRT 10/09/2018 for which we offered new ppm implant.  She cancelled this and has declined to have her device replaced since that time. The device remains at RRT.   I have again offered device removal with a new leadless pacemaker implant today.  She is very clear that she does not wish to proceed.  She prefers a more conservative approach.  She will contact my office if she changes her mind.  We will respect her wishes.    2. Paroxysmal atrial fibrillation Well controlled  On eliquis  3. HTN Stable No change required today  4. CAD S/p CABG No ischemic symptoms  5. PAD S/p AKA Followed by vascular surgery  6. HL Continue crestor   Risks, benefits and potential toxicities for medications prescribed and/or refilled reviewed with patient today.   Return to see me in 6 months  Hillis Range MD, [facility] 05/31/2020 3:45 PM

## 2020-05-31 NOTE — Patient Instructions (Addendum)
Medication Instructions:  Your physician recommends that you continue on your current medications as directed. Please refer to the Current Medication list given to you today.  *If you need a refill on your cardiac medications before your next appointment, please call your pharmacy*  Lab Work: None ordered.  If you have labs (blood work) drawn today and your tests are completely normal, you will receive your results only by: [name] MyChart Message (if you have MyChart) OR . A paper copy in the mail If you have any lab test that is abnormal or we need to change your treatment, we will call you to review the results.  Testing/Procedures: None ordered.  Follow-Up: At [facility], you and your health needs are our priority.  As part of our continuing mission to provide you with exceptional heart care, we have created designated Provider Care Teams.  These Care Teams include your primary Cardiologist (physician) and Advanced Practice Providers (APPs -  Physician Assistants and Nurse Practitioners) who all work together to provide you with the care you need, when you need it.  We recommend signing up for the patient portal called "MyChart".  Sign up information is provided on this After Visit Summary.  MyChart is used to connect with patients for Virtual Visits (Telemedicine).  Patients are able to view lab/test results, encounter notes, upcoming appointments, etc.  Non-urgent messages can be sent to your provider as well.   To learn more about what you can do with MyChart, go to [email or web address].    Your next appointment:   Your physician wants you to follow-up in: 10/07/20 at 3:45 pm    Other Instructions:

## 2020-06-02 NOTE — Research (Signed)
Leadless Nanostim 66 month visit, patient was seen in conjunction with Dr. Johney Frame at the [facility] office.   Date of assessment:    10-04-2021___                 Site ID / Name : [id number]  [facility] Patient ID / Site patient ID: _[id number]_  I.  PHYSICIAN NAME:  Last Name:                        First Name:                 Middle Intial:   II. VISIT TYPE:  []  2 wk  []  6 wk  []   3 mo  []  6 mo  []  12 mo  []  18 mo []  24 mo []  30 mo  []   36 mo                   []  42 mo  []  48 mo  []   54 mo  []   60 mo  [x]   66 mo  []   72 mo []  78 mo  []  84 mo                            []  90 mo  []  96 mo []  102 mo        []  Unscheduled visit; IF unscheduled, check one;    []  In Clinic   []  Remote        []  Other type visit; Specify: ____________________       1. Cardiac Drug Therapy:     []  None              Check all that apply, If a drug is a combination drug, check both categories.                Drug Category                         Drug Names (Examples)       []  Antiarrhythmics (Class I)      Disopyramide / Norpace  Flecainide / Tambocor  Mexiletine  Procainamide / Pronestyl, Procan SR  Propafenone / Rythmol  Quinidine      []  Antiarrhythmics (Class III)   Amiodarone / Pacerone  Dofetilide / Tikosyn  Dronedarone / Multaq  Ibutilide / Covert  Sotalol / Betapace     [x]  Anticoagulants  Ticagrelor / Brilinta  Apixaban / Eliquis 5 mg  Dabigatran / Pradaxa  Rivaroxaban / Xarelto  Edoxaban / Savaysa  Fondaparinux / Arixtra  Enoxaparin / Lovenox  Coumadin / Warfarin  Heparin      []  Anti-platelets  Acetylsalicylic acid / Aspirin  Clopidogrel / Plavix   Prasugrel / Effient  Ticlopidine / Ticlid  Cilostazol / Pletal   Dipyridamole / Persantine  Aggrostat    []  ACE inhibitors  Benazepril / Lotensin  Captopril / Capoten  Enalapril / Vasotec  Lisinopril / Prinivil, Zestril  Quinapril / Accupril  Ramipril / Altace  Trandolapril / Mavik    []  ARBs (Angiotensin II recep.blocker)  Candesartan /  Atacand  Eprosartan / Teveten  Irbesartan /  Avapro  Losartan / Cozaar  Olmesartan / Benicar  Telmisartan / Micardis  Valsartan / Diovan    [x]   Beta Blockers  Acebutolol / Sectral  Atenolol / Tenormin  Bisoprolol / Zebeta  Carvedilol /  Coreg  Esmolol / Brevibloc  Labetalol / Normodyne  Metoprolol / Lopressor, Toprol  25mg  Nadolol / Corgard  Nebivolol / Bystolic  Propranolol / Inderal  Timolol / Blocadren                2. Has the subject experienced an Adverse Event since last assessment?                      []  Yes,   If yes, please complete an Adverse Event Form                     [x]  No                      Assessments:   Nanostim Leadless Pacemaker (LP) Assessment and Programming (All visits)   [x]  Yes   []  No   If No, please specify: _________________   And submit a deviation form   24- Hour Holter Monitor performed?                                                                   Not applicable for Phase II    Graded Exercise Test (CAEP) performed?   []  Yes []  No [x]  NA    EQ-5D Patient Survey performed?   []  Yes []  No [x]  NA    COMMENTS:

## 2020-06-09 ENCOUNTER — Other Ambulatory Visit: Payer: Self-pay | Admitting: *Deleted

## 2020-06-09 DIAGNOSIS — I779 Disorder of arteries and arterioles, unspecified: Secondary | ICD-10-CM

## 2020-06-09 DIAGNOSIS — Z95828 Presence of other vascular implants and grafts: Secondary | ICD-10-CM

## 2020-06-09 DIAGNOSIS — I739 Peripheral vascular disease, unspecified: Secondary | ICD-10-CM

## 2020-06-10 NOTE — Progress Notes (Deleted)
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  Admitted 11/17 withNSTEMI (troponin peak of 4.10). Initial cath showed a widely patent LCx stent but there was evidence of focal thrombus at the distal LM and ostial LAD. She was continued on Aggrastat and a relook cath was performed on 11/17 which showed 40-50% distal LMCA haziness and 70-80% ostial LAD stenosis. Echocardiogram November 2017 showed ejection fraction 55-60%, grade 2 diastolic dysfunction, mild left atrial enlargement, mild mitral regurgitation and moderate tricuspid regurgitation.HadCABG on 07/17/2016 with LIMA-LAD, SVG-OM, and SVG-dRCA). Also with h/o PAF.Carotid Dopplers November 2018 showed 1 to 39% right and 40 to 59% left stenosis.  Patient had thrombectomy left femoral-popliteal bypass patch angioplasty proximal left femoral popliteal and common femoral artery and thrombectomy left external iliac artery, profunda, superficial femoral artery in September 2020.  Patient had left transmetatarsal amputation for digit ischemia November 2020. Since last seen   Current Outpatient Medications  Medication Sig Dispense Refill  . acetaminophen (TYLENOL) 500 MG tablet Take 500-1,000 mg by mouth every 8 (eight) hours as needed for mild pain or headache.    [name] ELIQUIS 5 MG TABS tablet TAKE 1 TABLET BY MOUTH TWICE DAILY 180 tablet 3  . Ensure Max Protein (ENSURE MAX PROTEIN) LIQD Take 8 oz by mouth 2 (two) times daily after a meal.     . metoprolol tartrate (LOPRESSOR) 25 MG tablet Take 0.5 tablets (12.5 mg total) by mouth 2 (two) times daily. 180 tablet 3  . rosuvastatin (CRESTOR) 20 MG tablet Take 20 mg by mouth daily.     No current facility-administered medications for this visit.     Past Medical History:  Diagnosis Date  . Abnormal LFTs    a. in the past when on Lipitor  . Anemia    as a young woman  . Anxiety   .  Arnold-Chiari malformation, type I (HCC)    MRI brain 02/2010  . CAD (coronary artery disease)    a. NSTEMI 07/2009: LHC - D2 40%, LAD irreg., EF 50% with apical AK (tako-tsubo CM);  b. Inf STEMI (04/2013): Tx Promus DES to CFX;  c. 08/2013 Lexi CL: No ischemia, dist ant/ap/inf-ap infarct, EF  d. 06/2016 NSTEMI with LM disease: s/p CABG on 07/17/2016 w/ LIMA-LAD, SVG-OM, and SVG-dRCA.  [name] DEPRESSION   . Dizziness    ? CVA 01/2010 - carotid dopplers with no ICA stenosis  . GERD (gastroesophageal reflux disease)   . Headache   . 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 HYPERLIPIDEMIA   . HYPERTENSION   . MI (myocardial infarction) (HCC)   . PAD (peripheral artery disease) (HCC)    s/p L fem pop 11/2013 in setting of 1st toe osteo/gangrene  . Paroxysmal atrial fibrillation (HCC)    a. anticoagulated with Eliquis 10/2014  . 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 RA (rheumatoid arthritis) (HCC)    prior tx by Dr. Dareen Piano  . Sjogren's syndrome (HCC) 06/02/13   pt denies this (12/01/13)  . SLE (systemic lupus erythematosus) (HCC) dx 05/2013   follows with rheum Dareen Piano)  . Tachy-brady syndrome (HCC)    a. s/p STJ Leadless pacemaker 11-04-2014 Dr Johney Frame  . 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 Wears dentures   . Wears glasses  Past Surgical History:  Procedure Laterality Date  . ABDOMINAL AORTAGRAM N/A 11/24/2013   Procedure: ABDOMINAL AORTAGRAM WITH BILATERAL RUNOFF WITH POSSIBLE INTERVENTION;  Surgeon: Chuck Hint, MD;  Location: [facility] CATH LAB;  Service: Cardiovascular;  Laterality: N/A;  . ABDOMINAL AORTOGRAM W/LOWER EXTREMITY N/A 04/25/2019   Procedure: ABDOMINAL AORTOGRAM W/LOWER EXTREMITY;  Surgeon: Sherren Kerns, MD;  Location: [facility] INVASIVE CV LAB;  Service: Cardiovascular;  Laterality: N/A;  . ABDOMINAL AORTOGRAM W/LOWER EXTREMITY  Bilateral 05/15/2019   Procedure: ABDOMINAL AORTOGRAM W/LOWER EXTREMITY;  Surgeon: Nada Libman, MD;  Location: [facility] INVASIVE CV LAB;  Service: Cardiovascular;  Laterality: Bilateral;  . AMPUTATION Left 12/02/2013   Procedure: LEFT 1ST TOE AMPUTATION;  Surgeon: Sherren Kerns, MD;  Location: [facility] OR;  Service: Vascular;  Laterality: Left;  . AMPUTATION Left 07/16/2019   Procedure: LEFT TRANSMETATARSAL AMPUTATION;  Surgeon: Nadara Mustard, MD;  Location: [facility] OR;  Service: Orthopedics;  Laterality: Left;  . AMPUTATION Left 10/01/2019   Procedure: LEFT AMPUTATION ABOVE KNEE;  Surgeon: Sherren Kerns, MD;  Location: [facility] OR;  Service: Vascular;  Laterality: Left;  . APPLICATION OF WOUND VAC Left 11/03/2019   Procedure: Application Of Wound Vac, left lower extremity;  Surgeon: Sherren Kerns, MD;  Location: [facility] OR;  Service: Vascular;  Laterality: Left;  . CARDIAC CATHETERIZATION N/A 07/12/2016   Procedure: Left Heart Cath and Coronary Angiography;  Surgeon: Lennette Bihari, MD;  Location: [facility] INVASIVE CV LAB;  Service: Cardiovascular;  Laterality: N/A;  . CARDIAC CATHETERIZATION N/A 07/14/2016   Procedure: Intravascular Ultrasound/IVUS;  Surgeon: Yvonne Kendall, MD;  Location: [facility] INVASIVE CV LAB;  Service: Cardiovascular;  Laterality: N/A;  . COLONOSCOPY    . CORONARY ANGIOPLASTY  04/2013  . CORONARY ARTERY BYPASS GRAFT N/A 07/17/2016   Procedure: CORONARY ARTERY BYPASS GRAFTING (CABG)x3 using right greater saphenous vein harvested endoscopiclly and left internal mammary artery;  Surgeon: Kerin Perna, MD;  Location: [facility] OR;  Service: Open Heart Surgery;  Laterality: N/A;  . ENDARTERECTOMY FEMORAL Left 05/12/2019   Procedure: LEFT Femoral Endarterectomy;  Surgeon: Sherren Kerns, MD;  Location[facility] OR;  Service: Vascular;  Laterality: Left;  . FEMORAL-POPLITEAL BYPASS GRAFT Left 12/02/2013   Procedure:  LEFT FEMORAL-BELOW KNEE POPLITEAL ARTERY BYPASS GRAFT;  Surgeon: Sherren Kerns, MD;  Location: [facility]  OR;  Service: Vascular;  Laterality: Left;  . FEMORAL-POPLITEAL BYPASS GRAFT Left 09/25/2019   Procedure: THROMBECTOMY and PROPATEN BYPASS  FEMORAL TO BELOW KNEE POPLITEAL ARTERY BYPASS GRAFT LEFT LEG;  Surgeon: Larina Earthly, MD;  Location: [facility] OR;  Service: Vascular;  Laterality: Left;  . LEFT HEART CATHETERIZATION WITH CORONARY ANGIOGRAM N/A 05/19/2013   Procedure: LEFT HEART CATHETERIZATION WITH CORONARY ANGIOGRAM;  Surgeon: Micheline Chapman, MD;  Location: [facility] CATH LAB;  Service: Cardiovascular;  Laterality: N/A;  . LEFT HEART CATHETERIZATION WITH CORONARY ANGIOGRAM N/A 10/28/2014   Procedure: LEFT HEART CATHETERIZATION WITH CORONARY ANGIOGRAM;  Surgeon: Iran Ouch, MD;  Location: [facility] CATH LAB;  Service: Cardiovascular;  Laterality: N/A;  . LOWER EXTREMITY ANGIOGRAM Left 05/12/2019   Procedure: Lower Extremity Angiogram;  Surgeon: Sherren Kerns, MD;  Location: [facility] OR;  Service: Vascular;  Laterality: Left;  . PATCH ANGIOPLASTY Left 05/12/2019   Procedure: Left Femoral Patch Angioplasty USING CADAVERIC SAPHENOUS VEIN;  Surgeon: Sherren Kerns, MD;  Location: [facility] OR;  Service: Vascular;  Laterality: Left;  . PERCUTANEOUS CORONARY STENT INTERVENTION (PCI-S)  05/19/2013   Procedure: PERCUTANEOUS CORONARY STENT INTERVENTION (PCI-S);  Surgeon:  Micheline Chapman, MD;  Location: [facility] CATH LAB;  Service: Cardiovascular;;  . PERMANENT PACEMAKER INSERTION N/A 11/04/2014   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  . TEE WITHOUT CARDIOVERSION N/A 07/17/2016   Procedure: TRANSESOPHAGEAL ECHOCARDIOGRAM (TEE);  Surgeon: Kerin Perna, MD;  Location: [facility] OR;  Service: Open Heart Surgery;  Laterality: N/A;  . THROMBECTOMY FEMORAL ARTERY Left 05/12/2019   Procedure: Left Ilio-Femoral Artery and Femoral-popliteal Graft Thrombectomy;  Surgeon: Sherren Kerns, MD;  Location: [facility] OR;  Service: Vascular;  Laterality: Left;  . TUBAL LIGATION    . VISCERAL ANGIOGRAPHY N/A 04/25/2019   Procedure:  MESENTRIC  ANGIOGRAPHY;  Surgeon: Sherren Kerns, MD;  Location: [facility] INVASIVE CV LAB;  Service: Cardiovascular;  Laterality: N/A;  . WOUND DEBRIDEMENT Left 11/03/2019   Procedure: Debridement Wound of left above the knee amputation;  Surgeon: Sherren Kerns, MD;  Location: [facility] OR;  Service: Vascular;  Laterality: Left;    Social History   Socioeconomic History  . Marital status: Single    Spouse name: Not on file  . Number of children: 3  . Years of education: Not on file  . Highest education level: Not on file  Occupational History  . Occupation: retired  Tobacco Use  . Smoking status: Former Smoker    Types: Cigarettes    Quit date: 2019    Years since quitting: 2.7  . Smokeless tobacco: Never Used  . Tobacco comment:    Vaping Use  . Vaping Use: Never used  Substance and Sexual Activity  . Alcohol use: No    Alcohol/week: 0.0 standard drinks  . Drug use: Not Currently    Types: Marijuana, Other-see comments    Comment:  and CBD oil  last use of both was 10/29/19  . Sexual activity: Not on file  Other Topics Concern  . Not on file  Social History Narrative   Lives alone.   Social Determinants of Health   Financial Resource Strain: Low Risk   . Difficulty of Paying Living Expenses: Not hard at all  Food Insecurity: No Food Insecurity  . Worried About Programme researcher, broadcasting/film/video in the Last Year: Never true  . Ran Out of Food in the Last Year: Never true  Transportation Needs: No Transportation Needs  . Lack of Transportation (Medical): No  . Lack of Transportation (Non-Medical): No  Physical Activity: Inactive  . Days of Exercise per Week: 0 days  . Minutes of Exercise per Session: 0 min  Stress: No Stress Concern Present  . Feeling of Stress : Not at all  Social Connections: Unknown  . Frequency of Communication with Friends and Family: More than three times a week  . Frequency of Social Gatherings with Friends and Family: Once a week  . Attends Religious Services:  Patient refused  . Active Member of Clubs or Organizations: Patient refused  . Attends Banker Meetings: Patient refused  . Marital Status: Never married  Intimate Partner Violence: Not At Risk  . Fear of Current or Ex-Partner: No  . Emotionally Abused: No  . Physically Abused: No  . Sexually Abused: No    Family History  Problem Relation Age of Onset  . Arthritis Mother   . Emphysema Mother   . Arthritis Father   . COPD Father   . Heart disease Father   . Diabetes Paternal Aunt        paternal Aunts  . Heart disease Paternal Aunt   .  Thyroid disease Neg Hx     ROS: no fevers or chills, productive cough, hemoptysis, dysphasia, odynophagia, melena, hematochezia, dysuria, hematuria, rash, seizure activity, orthopnea, PND, pedal edema, claudication. Remaining systems are negative.  Physical Exam: Well-developed well-nourished in no acute distress.  Skin is warm and dry.  HEENT is normal.  Neck is supple.  Chest is clear to auscultation with normal expansion.  Cardiovascular exam is regular rate and rhythm.  Abdominal exam nontender or distended. No masses palpated. Extremities show no edema. neuro grossly intact  ECG- personally reviewed  A/P  1 coronary artery disease status post coronary artery bypass graft-patient doing well with no chest pain.  Continue statin.  No aspirin given need for anticoagulation.  2 carotid artery disease-we will arrange follow-up carotid Dopplers.  3 peripheral vascular disease-managed by vascular surgery.  4 paroxysmal atrial fibrillation-patient is in sinus rhythm today.  Continue beta-blocker at present dose.  Continue apixaban.  5 hypertension-blood pressure controlled.  Continue present medications.  6 hyperlipidemia-continue statin.  Check lipids and liver.  7 pacemaker-followed by Dr. Johney Frame.  Daily tobacco use-patient again counseled on discontinuing.  Olga Millers, MD

## 2020-06-15 ENCOUNTER — Ambulatory Visit: Payer: Medicare Other | Admitting: Cardiology

## 2020-06-17 ENCOUNTER — Other Ambulatory Visit (HOSPITAL_COMMUNITY): Payer: Medicare Other

## 2020-06-18 ENCOUNTER — Ambulatory Visit (HOSPITAL_COMMUNITY): Admission: RE | Admit: 2020-06-18 | Payer: Medicare Other | Source: Home / Self Care | Admitting: Vascular Surgery

## 2020-06-18 ENCOUNTER — Encounter (HOSPITAL_COMMUNITY): Admission: RE | Payer: Self-pay | Source: Home / Self Care

## 2020-06-18 DIAGNOSIS — M069 Rheumatoid arthritis, unspecified: Secondary | ICD-10-CM | POA: Diagnosis not present

## 2020-06-18 DIAGNOSIS — Z008 Encounter for other general examination: Secondary | ICD-10-CM | POA: Diagnosis not present

## 2020-06-18 DIAGNOSIS — I10 Essential (primary) hypertension: Secondary | ICD-10-CM | POA: Diagnosis not present

## 2020-06-18 DIAGNOSIS — E213 Hyperparathyroidism, unspecified: Secondary | ICD-10-CM | POA: Diagnosis not present

## 2020-06-18 SURGERY — ABDOMINAL AORTOGRAM W/LOWER EXTREMITY
Anesthesia: LOCAL | Laterality: Bilateral

## 2020-06-30 ENCOUNTER — Other Ambulatory Visit: Payer: Self-pay

## 2020-06-30 NOTE — Patient Outreach (Signed)
Aging Gracefully Program  06/30/2020  Mercedes [name] April 05, 1956 [id number]  [facility] Evaluation Interviewer made contact with patient. Aging Gracefully survey completed.   Interviewer will send referral to OT for follow up.   [facility] Management Assistant

## 2020-07-12 ENCOUNTER — Other Ambulatory Visit: Payer: Self-pay | Admitting: Nurse Practitioner

## 2020-07-12 NOTE — Telephone Encounter (Signed)
Prescription refill request for Eliquis received.  Last office visit: Allred, 05/31/2020 Scr: 0.79, 11/24/2019 Age: 64  yo Weight: 44.5 kg  Prescription refill sent.

## 2020-07-13 ENCOUNTER — Other Ambulatory Visit: Payer: Self-pay

## 2020-07-13 ENCOUNTER — Other Ambulatory Visit: Payer: Self-pay | Admitting: Occupational Therapy

## 2020-07-13 NOTE — Patient Outreach (Signed)
Aging Gracefully Program  OT Initial Visit  07/13/2020  Mercedes [name] 02-05-1956 [id number]  Visit:  1- Initial Visit  Start Time:  1500 End Time:  1640 Total Minutes:  100  CCAP: Typical Daily Routine: Typical Daily Routine:: Mercedes [name] gets up around 7:30 every morning, takes time to wake up by getting coffee and watching the news then she gets ready for the day. She spends her time around the house most days since she can't get out of house by herself. What Types Of Care Problems Are You Having Throughout The Day?: No self care problems. Her major concern is she cannot get out of the house by herself and lives alone. What Kind Of Help Do You Receive?: She has a Aide at least 5 days a week sometimes 7 for 2:45 per day Do You Think You Need Other Types Of Help?: Need to be able to get out of the house by myself What Do You Think Would Make Everyday Life Easier For You?: Being able to get out of my house by myself What Is A Good Day Like?: When I get to get out of the house by with the help of friends/damily What Is A Bad Day Like?: When I can't get out of the house when I want to Do You Have Time For Yourself?: Too much Patient Reported Equipment: Patient Reported Equipment Currently Used: Bedside Commode, Raised Toilet Seat, Rolling Walker, Tub Bench, Armed forces training and education officer While Showering: Maintaining Balance While Showering: Unable To Do Do You:: No Device/No Assistance Observation: Maintain Balance While Showering: Independent With Pain, Difficulty, Or Use Of Device Safety: A Little Risk Intervention: Yes Functional Mobility-Stooping, Crouching, Kneeling To Retreive Item: Stooping, Crouching, or Kneeling To Retrieve Item: A Lot Of Difficulty Functional Mobility-Bending From Standing Position To Pick Up Clothing Off The Floor: Bending Over From Standing Position To Pick Up Clothing Off The Floor: Unable To Do Functional Mobility-Climb 1  Flight Of Stairs: Climb 1 Flight Of Stairs: Unable To Do Functional Mobility-Get On And Off Toilet: Getting Up From The Floor: A Lot Of Difficulty     Activities of Daily Living-Rest And Sleep: Rest and Sleep: Moderate Difficulty Instrumental Activities of Daily Living-Light Homemaking (Laundry, Straightening Up, Vacuuming):  Do Light Homemaking (Laundry, Straightening Up, Vacuuming): Moderate Difficulty (Aid A her what is difficult to do) Instrumental Activities of Daily Living-Making A Bed: Making a Bed: Moderate Difficulty (She can unmake bed, wash sheets, Aide A her with rest) IInstrumental Activities of Daily Living-Grocery Shopping: Do Grocery Shopping: A Lot of Difficulty Other Comments:: Due to she cannot get ouft of the house by herself    Instrumental Activities of Daily Living-Health Management And Maintenance: Health Management & Maintenance: Moderate Difficulty (has lost 15-20 pounds since L AKA) Instrumental Activities of Daily Living-Meal Preparation and Clean-Up: Other Comments:: Aid prepares some of food that she then just heats up  Readiness To Change Score:  Readiness to Change Score: 10  Home Environment Assessment: Outside Home Entry:: Front: she cannot get out of her front door due to the threshold at front door is higher than her flooring in liviing room and higher than her porch. Back entry way: 3 steps up to landing Kitchen:: Floor is  sagging Bathroom:: Floor is sagging in guest bath Hallways:: Floor is saging Mailbox:: At street where she cannot get to it   Goals: Goals Addressed            This Visit's Progress   .  Patient Stated       She would like to be able to get in and out of her house at front and back doors by herself (needs ramps and would like and area to sit out on at back of house.    . Patient Stated       She would like to have a cushion for wheelchair that raises her higher (currently she has a 2 inch cushion with pillow also on  top of that)    . Patient Stated       She would like easy access to her mailbox. (mailbox at/on house)    . Patient Stated       Be able to get to the curb from her house at the front door when friends/family pick up her (needs something to go over the stepping stones so she can either get her wheelchair or rolling walker down it.       Post Clinical Reasoning: Clinician View Of Client Situation:: Mercedes [name] had a left AKA in [place] of 2021. Since then she has mainly gotten around in a wheelchair with occassional  use of a RW when situations will not accommodate a wheelchair and only when someone is with her in case she falls. She reports when she initally came home that she had alot of family around to assist her, but knew that they had lives of their own that they needed to tend to so she started trying to find help from other sources. She now has an aide at least 5 days a week for 2:45 hours and sometimes on the weekends as well. She has a lawn person and family or friends get her groceries or whatever she needs with occassionally her going with them. She cannot get out of her house by herself due to thresholds and steps and thus cannot go anywhere unless her family or friends take her. She has a good attitude for all that she has been through but is depressed since she cannot get out of her house on her own. Client View Of His/Her Situation:: Mercedes [name] is very appreciative of the assistance her family and friends have provided and continue to provide for her but she really wants to be as independent as possible and being able to get out of her house on her own is what she really wants. Next Visit Plan:: Looking at getting up from a fall and/or safety in and out of her house

## 2020-07-13 NOTE — Patient Instructions (Signed)
Goals Addressed            This Visit's Progress   . Patient Stated       She would like to be able to get in and out of her house at front and back doors by herself (needs ramps and would like and area to sit out on at back of house.    . Patient Stated       She would like to have a cushion for wheelchair that raises her higher (currently she has a 2 inch cushion with pillow also on top of that)    . Patient Stated       She would like easy access to her mailbox. (mailbox at/on house)    . Patient Stated       Be able to get to the curb from her house at the front door when friends/family pick up her (needs something to go over the stepping stones so she can either get her wheelchair or rolling walker down it.

## 2020-07-15 ENCOUNTER — Encounter (HOSPITAL_COMMUNITY): Payer: Medicare Other

## 2020-07-15 ENCOUNTER — Other Ambulatory Visit: Payer: Self-pay | Admitting: *Deleted

## 2020-07-15 NOTE — Patient Outreach (Signed)
Aging Gracefully Program  07/15/2020  Mercedes [name] [birth year] [id number]  Placed call to client [name] to schedule initial Aging Gracefully RN home visit; HIPAA/ identity verified.     Visit scheduled with client for Thursday, July 29, 2020 at 2:00 pm; verified patient's address and explained that I would contact her by phone on day of scheduled visit prior to arriving at her home, to complete coronavirus questionnaire screening tool; confirmed that patient has a mask available at her home for use during scheduled visit.   Provided my direct contact information to patient, should she wish to contact me prior to scheduled home visit.   Plan:   Scheduled RN initial home visit on Thursday, July 29, 2020 between 1:30- 2:30 pm  Caryl Pina, RN, BSN, [employer] Coordinator [facility] Care Management  [phone]

## 2020-07-21 ENCOUNTER — Telehealth: Payer: Self-pay

## 2020-07-21 NOTE — Telephone Encounter (Signed)
Telephone call placed to pt to inquire if ready to r/s aortogram with LE runoff and possible intervention with Dr. Darrick Penna. Pt stated not right now and will call office when ready.

## 2020-07-29 ENCOUNTER — Other Ambulatory Visit: Payer: Self-pay

## 2020-07-30 NOTE — Patient Outreach (Signed)
Aging Gracefully Program  RN Visit  07/30/2020  Mercedes [name] 22-Oct-1955 [id number]  Visit:   RN initial home visit  Start Time:   1300 End Time:   1405 Total Minutes:   87  Readiness To Change Score:     Universal RN Interventions: Calendar Distribution: Yes Exercise Review: No Medications: Yes Medication Changes: Yes Mood: Yes Pain: Yes Clinician View Of Client Situation: Screening coivd call and all answers were no. Arrived to home and parked in the front. leaves covering curb and yard.  Grandkids at home blowing leaves.  Home with strong smell of smoke.However patient reports a pack of cigarettes will last her months at a time.  Noted ash trays on front porch.  Walk through of home noted to soft areas in the floors especially the kitchen.  Patient without a ramp at front or back.  Patient appears to be in good spirits with a positive attitude. Caregiver had gone to the store for patient. Met caregiver who seems nice.  Caregivers 7 days per week for 2 hours and 45 minutes. Client View Of His/Her Situation: Patient reports that she is doing well since her amputation 2/21. reports she is still learning have to navigate with 1 leg. reports she needs alot of home repairs.  especially a ramp. Reports she is able to take her medications as prescribed. Reports caregivers assist her daily.  reports she is concerned about her prosthesis. Reports it is heavy and the color is wrong. She has follow up with [employer] on 08/02/2020.  Healthcare Provider Communication:none    Clinician View of Client Situation: Clinician View Of Client Situation: Screening coivd call and all answers were no. Arrived to home and parked in the front. leaves covering curb and yard.  Grandkids at home blowing leaves.  Home with strong smell of smoke.However patient reports a pack of cigarettes will last her months at a time.  Noted ash trays on front porch.  Walk through of home noted to soft areas in the floors  especially the kitchen.  Patient without a ramp at front or back.  Patient appears to be in good spirits with a positive attitude. Caregiver had gone to the store for patient. Met caregiver who seems nice.  Caregivers 7 days per week for 2 hours and 45 minutes. Client's View of His/Her Situation: Client View Of His/Her Situation: Patient reports that she is doing well since her amputation 2/21. reports she is still learning have to navigate with 1 leg. reports she needs alot of home repairs.  especially a ramp. Reports she is able to take her medications as prescribed. Reports caregivers assist her daily.  reports she is concerned about her prosthesis. Reports it is heavy and the color is wrong. She has follow up with [employer] on 08/02/2020.  Medication Assessment: Do You Have Any Problems Paying For Medications?: Yes Where Does Client Store Medications?: Other: (living room) Can Client Read Pill Bottles?: Yes Does Client Use A Pillbox?: Yes Does Anyone Assist Client In Filling Pillbox?: Yes Does Anyone Assist Client In Taking Medications?: No Do You Take Vitamin D?: No Total Number Of Medications That The Client Takes: 2 Does Client Have Any Questions Or Concerns About Medictions?: No Is Client Complaining Of Any Symptoms That Could Be Side Effects To Medications?: No Any Possible Changes In Medication Regimen?: No   Outpatient Encounter Medications as of 07/29/2020  Medication Sig   acetaminophen (TYLENOL) 500 MG tablet Take 500-1,000 mg by mouth every 8 (eight) hours  as needed for mild pain or headache.   ELIQUIS 5 MG TABS tablet TAKE 1 TABLET BY MOUTH TWICE DAILY   Ensure Max Protein (ENSURE MAX PROTEIN) LIQD Take 8 oz by mouth 2 (two) times daily after a meal.    metoprolol tartrate (LOPRESSOR) 25 MG tablet Take 0.5 tablets (12.5 mg total) by mouth 2 (two) times daily.   rosuvastatin (CRESTOR) 20 MG tablet Take 20 mg by mouth daily. (Patient not taking: Reported on 07/29/2020)   No  facility-administered encounter medications on file as of 07/29/2020.    OT Update: pending home assessment from [facility]  Session Summary: Wheelchair bound patient with recent amputation. Reviewed safety and fall precautions.   Goals Addressed              This Visit's Progress     Patient Stated (pt-stated)        Aging Gracefully: Patient states she would like to avoid falls in the next 90 days.   Interventions: reviewed fall precautions, reviewed importance of using assistive devices. Reviewed safety in bathroom where floor is wet.  Tomasa Rand, RN, BSN, CEN [facility] [phone]      Next home visit planned for 08/25/2020  At [place], RN, BSN, [facility] [employer] [phone]

## 2020-08-16 ENCOUNTER — Telehealth: Payer: Self-pay

## 2020-08-16 ENCOUNTER — Other Ambulatory Visit: Payer: Self-pay

## 2020-08-16 ENCOUNTER — Other Ambulatory Visit: Payer: Self-pay | Admitting: Physician Assistant

## 2020-08-16 MED ORDER — TRAMADOL HCL 50 MG PO TABS: 50.0000 mg | ORAL_TABLET | Freq: Four times a day (QID) | ORAL | 0 refills | Status: DC | PRN

## 2020-08-16 NOTE — Telephone Encounter (Signed)
Scheduled patient for needed aortogram - she is in severe pain with her toe - discussed with PA - called in tramadol and put on for [place] to do procedure.

## 2020-08-19 ENCOUNTER — Other Ambulatory Visit: Payer: Self-pay

## 2020-08-19 ENCOUNTER — Telehealth: Payer: Self-pay

## 2020-08-19 ENCOUNTER — Other Ambulatory Visit: Payer: Self-pay | Admitting: Physician Assistant

## 2020-08-19 DIAGNOSIS — I739 Peripheral vascular disease, unspecified: Secondary | ICD-10-CM

## 2020-08-19 MED ORDER — OXYCODONE-ACETAMINOPHEN 5-325 MG PO TABS: 1.0000 | ORAL_TABLET | Freq: Four times a day (QID) | ORAL | 0 refills | Status: DC | PRN

## 2020-08-19 NOTE — Telephone Encounter (Signed)
Called in pain medication to get patient through the weekend, she will have aortogram Monday with [facility].

## 2020-08-19 NOTE — Telephone Encounter (Signed)
Patient is having rest pain and severe pain in her toe that is a 8/10. She is scheduled for an aortogram on 09/03/20 with [facility]. She would like to wait until then, but advised her she likely need the procedure sooner and should go to ED. She verbalizes understanding but that she is nervous and will think about it.

## 2020-08-23 ENCOUNTER — Inpatient Hospital Stay (HOSPITAL_COMMUNITY)
Admission: RE | Admit: 2020-08-23 | Discharge: 2020-09-08 | DRG: 240 | Disposition: A | Discharge status: Home Health | Payer: Medicare Other | Attending: Vascular Surgery | Admitting: Vascular Surgery

## 2020-08-23 ENCOUNTER — Other Ambulatory Visit: Payer: Self-pay

## 2020-08-23 ENCOUNTER — Encounter (HOSPITAL_COMMUNITY)
Admission: RE | Disposition: A | Discharge status: Home Health | Payer: Self-pay | Source: Home / Self Care | Attending: Vascular Surgery

## 2020-08-23 DIAGNOSIS — E785 Hyperlipidemia, unspecified: Secondary | ICD-10-CM | POA: Diagnosis not present

## 2020-08-23 DIAGNOSIS — Z825 Family history of asthma and other chronic lower respiratory diseases: Secondary | ICD-10-CM

## 2020-08-23 DIAGNOSIS — Z89612 Acquired absence of left leg above knee: Secondary | ICD-10-CM

## 2020-08-23 DIAGNOSIS — I25119 Atherosclerotic heart disease of native coronary artery with unspecified angina pectoris: Secondary | ICD-10-CM | POA: Diagnosis not present

## 2020-08-23 DIAGNOSIS — M3500 Sicca syndrome, unspecified: Secondary | ICD-10-CM | POA: Diagnosis present

## 2020-08-23 DIAGNOSIS — M25852 Other specified joint disorders, left hip: Secondary | ICD-10-CM | POA: Diagnosis not present

## 2020-08-23 DIAGNOSIS — I214 Non-ST elevation (NSTEMI) myocardial infarction: Secondary | ICD-10-CM | POA: Diagnosis not present

## 2020-08-23 DIAGNOSIS — M069 Rheumatoid arthritis, unspecified: Secondary | ICD-10-CM | POA: Diagnosis present

## 2020-08-23 DIAGNOSIS — I10 Essential (primary) hypertension: Secondary | ICD-10-CM | POA: Diagnosis not present

## 2020-08-23 DIAGNOSIS — Z888 Allergy status to other drugs, medicaments and biological substances status: Secondary | ICD-10-CM | POA: Diagnosis not present

## 2020-08-23 DIAGNOSIS — K219 Gastro-esophageal reflux disease without esophagitis: Secondary | ICD-10-CM | POA: Diagnosis not present

## 2020-08-23 DIAGNOSIS — I70229 Atherosclerosis of native arteries of extremities with rest pain, unspecified extremity: Secondary | ICD-10-CM

## 2020-08-23 DIAGNOSIS — Z8249 Family history of ischemic heart disease and other diseases of the circulatory system: Secondary | ICD-10-CM

## 2020-08-23 DIAGNOSIS — Z91048 Other nonmedicinal substance allergy status: Secondary | ICD-10-CM | POA: Diagnosis not present

## 2020-08-23 DIAGNOSIS — M25552 Pain in left hip: Secondary | ICD-10-CM | POA: Diagnosis not present

## 2020-08-23 DIAGNOSIS — Z95828 Presence of other vascular implants and grafts: Secondary | ICD-10-CM | POA: Diagnosis not present

## 2020-08-23 DIAGNOSIS — Z955 Presence of coronary angioplasty implant and graft: Secondary | ICD-10-CM

## 2020-08-23 DIAGNOSIS — I5181 Takotsubo syndrome: Secondary | ICD-10-CM | POA: Diagnosis present

## 2020-08-23 DIAGNOSIS — I998 Other disorder of circulatory system: Secondary | ICD-10-CM | POA: Diagnosis not present

## 2020-08-23 DIAGNOSIS — Z91040 Latex allergy status: Secondary | ICD-10-CM

## 2020-08-23 DIAGNOSIS — Z950 Presence of cardiac pacemaker: Secondary | ICD-10-CM

## 2020-08-23 DIAGNOSIS — I495 Sick sinus syndrome: Secondary | ICD-10-CM | POA: Diagnosis not present

## 2020-08-23 DIAGNOSIS — I129 Hypertensive chronic kidney disease with stage 1 through stage 4 chronic kidney disease, or unspecified chronic kidney disease: Secondary | ICD-10-CM | POA: Diagnosis not present

## 2020-08-23 DIAGNOSIS — M329 Systemic lupus erythematosus, unspecified: Secondary | ICD-10-CM | POA: Diagnosis not present

## 2020-08-23 DIAGNOSIS — E1152 Type 2 diabetes mellitus with diabetic peripheral angiopathy with gangrene: Secondary | ICD-10-CM | POA: Diagnosis not present

## 2020-08-23 DIAGNOSIS — Z9889 Other specified postprocedural states: Secondary | ICD-10-CM | POA: Diagnosis not present

## 2020-08-23 DIAGNOSIS — N182 Chronic kidney disease, stage 2 (mild): Secondary | ICD-10-CM | POA: Diagnosis not present

## 2020-08-23 DIAGNOSIS — Z8261 Family history of arthritis: Secondary | ICD-10-CM

## 2020-08-23 DIAGNOSIS — L97519 Non-pressure chronic ulcer of other part of right foot with unspecified severity: Secondary | ICD-10-CM | POA: Diagnosis present

## 2020-08-23 DIAGNOSIS — F32A Depression, unspecified: Secondary | ICD-10-CM | POA: Diagnosis present

## 2020-08-23 DIAGNOSIS — I252 Old myocardial infarction: Secondary | ICD-10-CM | POA: Diagnosis not present

## 2020-08-23 DIAGNOSIS — F419 Anxiety disorder, unspecified: Secondary | ICD-10-CM | POA: Diagnosis present

## 2020-08-23 DIAGNOSIS — D62 Acute posthemorrhagic anemia: Secondary | ICD-10-CM | POA: Diagnosis not present

## 2020-08-23 DIAGNOSIS — Z20822 Contact with and (suspected) exposure to covid-19: Secondary | ICD-10-CM | POA: Diagnosis present

## 2020-08-23 DIAGNOSIS — I70261 Atherosclerosis of native arteries of extremities with gangrene, right leg: Secondary | ICD-10-CM | POA: Diagnosis not present

## 2020-08-23 DIAGNOSIS — Z8673 Personal history of transient ischemic attack (TIA), and cerebral infarction without residual deficits: Secondary | ICD-10-CM

## 2020-08-23 DIAGNOSIS — I708 Atherosclerosis of other arteries: Secondary | ICD-10-CM | POA: Diagnosis not present

## 2020-08-23 DIAGNOSIS — I480 Paroxysmal atrial fibrillation: Secondary | ICD-10-CM | POA: Diagnosis not present

## 2020-08-23 DIAGNOSIS — I2510 Atherosclerotic heart disease of native coronary artery without angina pectoris: Secondary | ICD-10-CM | POA: Diagnosis present

## 2020-08-23 DIAGNOSIS — I739 Peripheral vascular disease, unspecified: Secondary | ICD-10-CM | POA: Diagnosis not present

## 2020-08-23 DIAGNOSIS — I70235 Atherosclerosis of native arteries of right leg with ulceration of other part of foot: Secondary | ICD-10-CM | POA: Diagnosis not present

## 2020-08-23 DIAGNOSIS — Z951 Presence of aortocoronary bypass graft: Secondary | ICD-10-CM | POA: Diagnosis not present

## 2020-08-23 DIAGNOSIS — I200 Unstable angina: Secondary | ICD-10-CM | POA: Diagnosis not present

## 2020-08-23 DIAGNOSIS — R079 Chest pain, unspecified: Secondary | ICD-10-CM | POA: Diagnosis not present

## 2020-08-23 DIAGNOSIS — Z720 Tobacco use: Secondary | ICD-10-CM

## 2020-08-23 DIAGNOSIS — I361 Nonrheumatic tricuspid (valve) insufficiency: Secondary | ICD-10-CM | POA: Diagnosis not present

## 2020-08-23 DIAGNOSIS — M25559 Pain in unspecified hip: Secondary | ICD-10-CM

## 2020-08-23 DIAGNOSIS — Z7901 Long term (current) use of anticoagulants: Secondary | ICD-10-CM

## 2020-08-23 DIAGNOSIS — Z833 Family history of diabetes mellitus: Secondary | ICD-10-CM

## 2020-08-23 DIAGNOSIS — N179 Acute kidney failure, unspecified: Secondary | ICD-10-CM | POA: Diagnosis not present

## 2020-08-23 HISTORY — PX: ABDOMINAL AORTOGRAM W/LOWER EXTREMITY: CATH118223

## 2020-08-23 LAB — SARS CORONAVIRUS 2 BY RT PCR (HOSPITAL ORDER, PERFORMED IN ~~LOC~~ HOSPITAL LAB): SARS Coronavirus 2: NEGATIVE

## 2020-08-23 LAB — POCT I-STAT, CHEM 8
BUN: 13 mg/dL (ref 8–23)
Calcium, Ion: 1.15 mmol/L (ref 1.15–1.40)
Chloride: 104 mmol/L (ref 98–111)
Creatinine, Ser: 0.8 mg/dL (ref 0.44–1.00)
Glucose, Bld: 107 mg/dL — ABNORMAL HIGH (ref 70–99)
HCT: 39 % (ref 36.0–46.0)
Hemoglobin: 13.3 g/dL (ref 12.0–15.0)
Potassium: 3.9 mmol/L (ref 3.5–5.1)
Sodium: 137 mmol/L (ref 135–145)
TCO2: 25 mmol/L (ref 22–32)

## 2020-08-23 LAB — CBC
HCT: 39.4 % (ref 36.0–46.0)
Hemoglobin: 12.2 g/dL (ref 12.0–15.0)
MCH: 24.4 pg — ABNORMAL LOW (ref 26.0–34.0)
MCHC: 31 g/dL (ref 30.0–36.0)
MCV: 78.6 fL — ABNORMAL LOW (ref 80.0–100.0)
Platelets: 344 10*3/uL (ref 150–400)
RBC: 5.01 MIL/uL (ref 3.87–5.11)
RDW: 16.5 % — ABNORMAL HIGH (ref 11.5–15.5)
WBC: 6.1 10*3/uL (ref 4.0–10.5)
nRBC: 0 % (ref 0.0–0.2)

## 2020-08-23 LAB — CREATININE, SERUM
Creatinine, Ser: 0.92 mg/dL (ref 0.44–1.00)
GFR, Estimated: >60 mL/min (ref >60)

## 2020-08-23 SURGERY — ABDOMINAL AORTOGRAM W/LOWER EXTREMITY
Anesthesia: LOCAL

## 2020-08-23 MED ORDER — MUPIROCIN 2 % EX OINT
1.0000 "application " | TOPICAL_OINTMENT | Freq: Two times a day (BID) | CUTANEOUS | Status: DC
  Administered 2020-08-24: 1 via NASAL
  Filled 2020-08-23 (×2): qty 22

## 2020-08-23 MED ORDER — ENSURE MAX PROTEIN PO LIQD
8.0000 [oz_av] | Freq: Every day | ORAL | Status: DC
  Administered 2020-08-25 – 2020-09-02 (×8): 8 [oz_av] via ORAL
  Filled 2020-08-23 (×12): qty 330

## 2020-08-23 MED ORDER — LABETALOL HCL 5 MG/ML IV SOLN
10.0000 mg | INTRAVENOUS | Status: DC | PRN
Start: 2020-08-23 — End: 2020-09-08

## 2020-08-23 MED ORDER — HYDROMORPHONE HCL 1 MG/ML IJ SOLN
0.5000 mg | INTRAMUSCULAR | Status: DC | PRN
  Administered 2020-08-23 – 2020-08-24 (×2): 1 mg via INTRAVENOUS
  Administered 2020-08-25: 0.5 mg via INTRAVENOUS
  Filled 2020-08-23 (×3): qty 1

## 2020-08-23 MED ORDER — NITROGLYCERIN 0.4 MG SL SUBL: 0.4000 mg | SUBLINGUAL_TABLET | SUBLINGUAL | Status: DC | PRN

## 2020-08-23 MED ORDER — HYDROMORPHONE HCL 1 MG/ML IJ SOLN
1.0000 mg | Freq: Once | INTRAMUSCULAR | Status: AC
  Administered 2020-08-23: 11:00:00 1 mg via INTRAVENOUS

## 2020-08-23 MED ORDER — SODIUM CHLORIDE 0.9 % IV SOLN: 250.0000 mL | INTRAVENOUS | Status: DC | PRN

## 2020-08-23 MED ORDER — IODIXANOL 320 MG/ML IV SOLN
INTRAVENOUS | Status: DC | PRN
  Administered 2020-08-23: 15:00:00 73 mL via INTRA_ARTERIAL

## 2020-08-23 MED ORDER — ACETAMINOPHEN 325 MG PO TABS
650.0000 mg | ORAL_TABLET | ORAL | Status: DC | PRN
  Filled 2020-08-23: qty 2

## 2020-08-23 MED ORDER — HYDROMORPHONE HCL 1 MG/ML IJ SOLN
INTRAMUSCULAR | Status: AC
  Filled 2020-08-23: qty 1

## 2020-08-23 MED ORDER — HYDRALAZINE HCL 20 MG/ML IJ SOLN: 5.0000 mg | INTRAMUSCULAR | Status: DC | PRN

## 2020-08-23 MED ORDER — HEPARIN (PORCINE) IN NACL 1000-0.9 UT/500ML-% IV SOLN
INTRAVENOUS | Status: DC | PRN
  Administered 2020-08-23 (×2): 500 mL

## 2020-08-23 MED ORDER — METOPROLOL TARTRATE 12.5 MG HALF TABLET
12.5000 mg | ORAL_TABLET | Freq: Two times a day (BID) | ORAL | Status: DC
  Administered 2020-08-25 – 2020-09-03 (×16): 12.5 mg via ORAL
  Filled 2020-08-23 (×21): qty 1

## 2020-08-23 MED ORDER — SODIUM CHLORIDE 0.9% FLUSH: 3.0000 mL | INTRAVENOUS | Status: DC | PRN

## 2020-08-23 MED ORDER — HEPARIN (PORCINE) IN NACL 1000-0.9 UT/500ML-% IV SOLN
INTRAVENOUS | Status: AC
  Filled 2020-08-23: qty 1000

## 2020-08-23 MED ORDER — SODIUM CHLORIDE 0.9 % IV SOLN: INTRAVENOUS | Status: DC

## 2020-08-23 MED ORDER — FENTANYL CITRATE (PF) 100 MCG/2ML IJ SOLN
INTRAMUSCULAR | Status: AC
  Filled 2020-08-23: qty 2

## 2020-08-23 MED ORDER — FENTANYL CITRATE (PF) 100 MCG/2ML IJ SOLN
INTRAMUSCULAR | Status: DC | PRN
  Administered 2020-08-23: 50 ug via INTRAVENOUS

## 2020-08-23 MED ORDER — HEPARIN SODIUM (PORCINE) 5000 UNIT/ML IJ SOLN
5000.0000 [IU] | Freq: Three times a day (TID) | INTRAMUSCULAR | Status: DC
  Administered 2020-08-23 – 2020-08-24 (×3): 5000 [IU] via SUBCUTANEOUS
  Filled 2020-08-23 (×5): qty 1

## 2020-08-23 MED ORDER — SODIUM CHLORIDE 0.9 % IV SOLN: INTRAVENOUS | Status: AC

## 2020-08-23 MED ORDER — ONDANSETRON HCL 4 MG/2ML IJ SOLN
4.0000 mg | Freq: Four times a day (QID) | INTRAMUSCULAR | Status: DC | PRN
  Administered 2020-08-23 – 2020-08-25 (×3): 4 mg via INTRAVENOUS
  Filled 2020-08-23 (×3): qty 2

## 2020-08-23 MED ORDER — ROSUVASTATIN CALCIUM 20 MG PO TABS
40.0000 mg | ORAL_TABLET | Freq: Every day | ORAL | Status: DC
  Administered 2020-08-23 – 2020-09-07 (×14): 40 mg via ORAL
  Filled 2020-08-23 (×15): qty 2

## 2020-08-23 MED ORDER — LIDOCAINE HCL (PF) 1 % IJ SOLN
INTRAMUSCULAR | Status: AC
  Filled 2020-08-23: qty 30

## 2020-08-23 MED ORDER — OXYCODONE HCL 5 MG PO TABS
5.0000 mg | ORAL_TABLET | ORAL | Status: DC | PRN
Start: 2020-08-23 — End: 2020-08-24
  Administered 2020-08-23 (×2): 10 mg via ORAL
  Administered 2020-08-24: 5 mg via ORAL
  Administered 2020-08-24: 10 mg via ORAL
  Filled 2020-08-23: qty 1
  Filled 2020-08-23 (×3): qty 2

## 2020-08-23 MED ORDER — SODIUM CHLORIDE 0.9% FLUSH
3.0000 mL | Freq: Two times a day (BID) | INTRAVENOUS | Status: DC
  Administered 2020-08-25 – 2020-09-08 (×28): 3 mL via INTRAVENOUS

## 2020-08-23 MED ORDER — ASPIRIN EC 81 MG PO TBEC
81.0000 mg | DELAYED_RELEASE_TABLET | Freq: Every day | ORAL | Status: DC
  Administered 2020-08-23 – 2020-09-08 (×16): 81 mg via ORAL
  Filled 2020-08-23 (×16): qty 1

## 2020-08-23 MED ORDER — LIDOCAINE HCL (PF) 1 % IJ SOLN
INTRAMUSCULAR | Status: DC | PRN
  Administered 2020-08-23: 15 mL via INTRADERMAL

## 2020-08-23 SURGICAL SUPPLY — 9 items
CATH OMNI FLUSH 5F 65CM (CATHETERS) ×2 IMPLANT
KIT MICROPUNCTURE NIT STIFF (SHEATH) ×2 IMPLANT
KIT PV (KITS) ×2 IMPLANT
SHEATH PINNACLE 5F 10CM (SHEATH) ×2 IMPLANT
SHEATH PROBE COVER 6X72 (BAG) ×2 IMPLANT
SYR MEDRAD MARK V 150ML (SYRINGE) ×2 IMPLANT
TRANSDUCER W/STOPCOCK (MISCELLANEOUS) ×2 IMPLANT
TRAY PV CATH (CUSTOM PROCEDURE TRAY) ×2 IMPLANT
WIRE BENTSON .035X145CM (WIRE) ×2 IMPLANT

## 2020-08-23 NOTE — Op Note (Signed)
° ° °  Patient name: Mercedes [name] MRN: [id number] DOB: November 13, 1955 Sex: female  08/23/2020 Pre-operative Diagnosis: critical right lower extremity ischemia with wound Post-operative diagnosis:  Same Surgeon:  Apolinar Junes C. Randie Heinz, MD Procedure Performed: 1.  Ultrasound-guided cannulation right common femoral artery 2.  Aortogram with right lower extremity angiogram  Indications: 64 year old female with a history of a left lower extremity bypass and subsequent above-knee amputation.  She now has critical limb ischemia with ulceration of the right great toe and severe rest pain of the entire foot.  She has an ABI previously of 0.3.  She is indicated for angiography possible intervention.  Findings: By duplex I could not locate a left common femoral artery.  I cannulated the right common femoral artery which was heavily diseased.  The aorta was disease as well as the right common and external iliac arteries but there was no flow-limiting stenosis.  There was no hypogastric artery patent on the right.  Left side common external iliac arteries are occluded.  Right lower extremity the common femoral artery occludes she does reconstitute a profunda.  She reconstitutes what appears to be a diseased above-the-knee popliteal artery the below the popliteal artery is much less diseased she has runoff in line to the foot via the peroneal artery and the anterior tibial artery reconstitutes distally at the ankle.  Patient will be scheduled for right common femoral endarterectomy and right femoral to peroneal artery bypass likely with graft as vein has been harvested for coronary artery bypass grafting in the past.   Procedure:  The patient was identified in the holding area and taken to room 8.  The patient was then placed supine on the table and prepped and draped in the usual sterile fashion.  A time out was called.  Ultrasound was used to evaluate the left common femoral artery however I could not truly identify the  common femoral artery and there was no pulse there.  I then identified the right common femoral artery was heavily diseased I cannulated this on the more cephalad aspect with a micropuncture needle with direct ultrasound visualization.  And images over the permanent record.  We placed a micropuncture needle followed by wire and sheath and a Bentson wire followed by 5 [place] sheath.  An Omni catheter was placed to the level of L1 aortogram was performed and then the catheter was removed over wire and retrograde sheath angiogram of the right lower extremity was performed with the above findings.  We will plan for bypass tomorrow.  She tolerated procedure well without any complication.  She will be pulled in postoperative holding.  Contrast: 73cc  Nelle Sayed C. Randie Heinz, MD [facility] Office: [phone] Pager: [phone]

## 2020-08-23 NOTE — Progress Notes (Signed)
Pt arrived to 4e from cath lab. Pt oriented to room and staff. Vitals obtained and stable. CHG bath done. Right AT pulse dopplered.

## 2020-08-23 NOTE — H&P (Signed)
    HPI Patient is a 64 year old female who returns for follow-up today.  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  The above-knee amputation has now completely healed.  She thinks she is ready for prosthetic.  She is transferring well to the wheelchair.  She recently had a nail trim on her right foot and had some infection but this seems to have healed spontaneously.  She does not really complain of continuous rest pain in the right foot.  She does have some intermittent right foot pain.  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  She has quit smoking.  She is on Eliquis for atrial fibrillation.  Review of systems: She has no shortness of breath.  She has no chest pain.        Current Outpatient Medications on File Prior to Visit  Medication Sig Dispense Refill  . acetaminophen (TYLENOL) 500 MG tablet Take 500-1,000 mg by mouth every 8 (eight) hours as needed for mild pain or headache.    [name] ELIQUIS 5 MG TABS tablet TAKE 1 TABLET BY MOUTH TWICE DAILY (Patient taking differently: Take 5 mg by mouth 2 (two) times daily. ) 180 tablet 3  . Ensure Max Protein (ENSURE MAX PROTEIN) LIQD Take 8 oz by mouth 2 (two) times daily after a meal.     . metoprolol tartrate (LOPRESSOR) 25 MG tablet Take 0.5 tablets (12.5 mg total) by mouth 2 (two) times daily. 180 tablet 3   No current facility-administered medications on file prior to visit.     Physical exam: Vitals:   08/23/20 0959  BP: (!) 162/75  Pulse: 81  Resp: 14  Temp: 98.2 F (36.8 C)  SpO2: 99%    Left above-knee amputation well-healed  Right leg no palpable pedal pulses all 5 toes dusky in appearance no open wound no evidence of infection some slightly ruborous changes of the proximal foot  Data: Patient had a right leg ABI today which showed an ABI on the right side of 0.3  Assessment: Patient now with right foot dusky in appearance  and ABI of 0.3.  Since she has already had a left above-knee amputation I believe we should proceed with aortogram and right lower extremity runoff to see if she needs an intervention at this point.  Most likely it would be a diagnostic arteriogram since she only has one-vessel peroneal runoff and we would have to discuss whether or not she would want to undergo another bypass operation.  The patient wished to think about this for a few weeks and has deferred her arteriogram until October 22.  She will call me sooner if she develops rest pain in the right foot or nonhealing wound.  Plan: Aortogram lower extremity runoff possible intervention.    Bernell Haynie C. Randie Heinz, MD [facility] [place] Office: [phone] Pager: [phone]

## 2020-08-23 NOTE — Progress Notes (Signed)
Pt c/o pain Dr Randie Heinz called and informed new orders noted

## 2020-08-23 NOTE — Anesthesia Preprocedure Evaluation (Addendum)
Anesthesia Evaluation  Patient identified by MRN, date of birth, ID band Patient awake    Reviewed: Allergy & Precautions, NPO status , Patient's Chart, lab work & pertinent test results, reviewed documented beta blocker date and time   History of Anesthesia Complications Negative for: history of anesthetic complications  Airway Mallampati: III  TM Distance: >3 FB Neck ROM: Full    Dental  (+) Dental Advisory Given   Pulmonary neg pulmonary ROS, former smoker,    Pulmonary exam normal        Cardiovascular hypertension, Pt. on medications and Pt. on home beta blockers + CAD, + Past MI, + Cardiac Stents, + CABG and + Peripheral Vascular Disease  Normal cardiovascular exam+ dysrhythmias Atrial Fibrillation + pacemaker    Hx Takotsubo CM 2010  '19 Carotid US - 40-59% left ICAS, 1-39% right ICAS  '17 TTE - mild LVH. EF 55% to 60%. Pseudonormal left ventricular filling pattern, with concomitant abnormal relaxationand increased filling pressure (grade 2 diastolic dysfunction). Mild MR. LA was mildly dilated. RV systolic function was mildly reduced. Moderate TR. PASP was mildly increased. PA peak pressure: 37 mm Hg    Neuro/Psych  Headaches, Seizures -, Well Controlled,  PSYCHIATRIC DISORDERS Anxiety Depression  Arnold-Chiari Type 1    GI/Hepatic Neg liver ROS, GERD  Controlled,  Endo/Other  diabetes  Renal/GU Renal disease     Musculoskeletal  (+) Arthritis , Rheumatoid disorders,    Abdominal   Peds  Hematology  On eliquis    Anesthesia Other Findings SLE Sjogren's syndrome Covid test negative   Reproductive/Obstetrics                            Anesthesia Physical Anesthesia Plan  ASA: IV  Anesthesia Plan: General   Post-op Pain Management:    Induction: Intravenous  PONV Risk Score and Plan: 3 and Treatment may vary due to age or medical condition, Ondansetron and  Dexamethasone  Airway Management Planned: Oral ETT  Additional Equipment: Arterial line  Intra-op Plan:   Post-operative Plan: Extubation in OR  Informed Consent: I have reviewed the patients History and Physical, chart, labs and discussed the procedure including the risks, benefits and alternatives for the proposed anesthesia with the patient or authorized representative who has indicated his/her understanding and acceptance.     Dental advisory given  Plan Discussed with: CRNA and Anesthesiologist  Anesthesia Plan Comments:        Anesthesia Quick Evaluation

## 2020-08-24 ENCOUNTER — Inpatient Hospital Stay (HOSPITAL_COMMUNITY): Payer: Medicare Other | Admitting: Anesthesiology

## 2020-08-24 ENCOUNTER — Encounter (HOSPITAL_COMMUNITY)
Admission: RE | Disposition: A | Discharge status: Home Health | Payer: Self-pay | Source: Home / Self Care | Attending: Vascular Surgery

## 2020-08-24 ENCOUNTER — Encounter (HOSPITAL_COMMUNITY): Payer: Self-pay | Admitting: Vascular Surgery

## 2020-08-24 DIAGNOSIS — I998 Other disorder of circulatory system: Secondary | ICD-10-CM

## 2020-08-24 HISTORY — PX: FEMORAL-POPLITEAL BYPASS GRAFT: SHX937

## 2020-08-24 LAB — BASIC METABOLIC PANEL
Anion gap: 8 (ref 5–15)
BUN: 11 mg/dL (ref 8–23)
CO2: 24 mmol/L (ref 22–32)
Calcium: 9.4 mg/dL (ref 8.9–10.3)
Chloride: 104 mmol/L (ref 98–111)
Creatinine, Ser: 0.79 mg/dL (ref 0.44–1.00)
GFR, Estimated: >60 mL/min (ref >60)
Glucose, Bld: 97 mg/dL (ref 70–99)
Potassium: 4.3 mmol/L (ref 3.5–5.1)
Sodium: 136 mmol/L (ref 135–145)

## 2020-08-24 LAB — SURGICAL PCR SCREEN
MRSA, PCR: NEGATIVE
Staphylococcus aureus: NEGATIVE

## 2020-08-24 LAB — CBC
HCT: 36 % (ref 36.0–46.0)
Hemoglobin: 11.5 g/dL — ABNORMAL LOW (ref 12.0–15.0)
MCH: 25.1 pg — ABNORMAL LOW (ref 26.0–34.0)
MCHC: 31.9 g/dL (ref 30.0–36.0)
MCV: 78.6 fL — ABNORMAL LOW (ref 80.0–100.0)
Platelets: 347 10*3/uL (ref 150–400)
RBC: 4.58 MIL/uL (ref 3.87–5.11)
RDW: 16.8 % — ABNORMAL HIGH (ref 11.5–15.5)
WBC: 7.1 10*3/uL (ref 4.0–10.5)
nRBC: 0 % (ref 0.0–0.2)

## 2020-08-24 LAB — POCT ACTIVATED CLOTTING TIME: Activated Clotting Time: 255 seconds

## 2020-08-24 SURGERY — BYPASS GRAFT FEMORAL-POPLITEAL ARTERY
Anesthesia: General | Laterality: Right

## 2020-08-24 MED ORDER — GUAIFENESIN-DM 100-10 MG/5ML PO SYRP: 15.0000 mL | ORAL_SOLUTION | ORAL | Status: DC | PRN

## 2020-08-24 MED ORDER — SODIUM CHLORIDE 0.9 % IV SOLN
INTRAVENOUS | Status: DC | PRN
  Administered 2020-08-24: 09:00:00 500 mL

## 2020-08-24 MED ORDER — HEMOSTATIC AGENTS (NO CHARGE) OPTIME
TOPICAL | Status: DC | PRN
  Administered 2020-08-24: 1 via TOPICAL

## 2020-08-24 MED ORDER — ONDANSETRON HCL 4 MG/2ML IJ SOLN
INTRAMUSCULAR | Status: DC | PRN
  Administered 2020-08-24: 4 mg via INTRAVENOUS

## 2020-08-24 MED ORDER — SODIUM CHLORIDE 0.9 % IV SOLN
INTRAVENOUS | Status: AC
  Filled 2020-08-24: qty 1.2

## 2020-08-24 MED ORDER — MAGNESIUM SULFATE 2 GM/50ML IV SOLN: 2.0000 g | Freq: Every day | INTRAVENOUS | Status: DC | PRN

## 2020-08-24 MED ORDER — PROPOFOL 10 MG/ML IV BOLUS
INTRAVENOUS | Status: AC
  Filled 2020-08-24: qty 20

## 2020-08-24 MED ORDER — ROCURONIUM BROMIDE 10 MG/ML (PF) SYRINGE
PREFILLED_SYRINGE | INTRAVENOUS | Status: AC
  Filled 2020-08-24: qty 10

## 2020-08-24 MED ORDER — ROCURONIUM 10MG/ML (10ML) SYRINGE FOR MEDFUSION PUMP - OPTIME
INTRAVENOUS | Status: DC | PRN
  Administered 2020-08-24: 100 mg via INTRAVENOUS

## 2020-08-24 MED ORDER — DOCUSATE SODIUM 100 MG PO CAPS
100.0000 mg | ORAL_CAPSULE | Freq: Every day | ORAL | Status: DC
  Administered 2020-08-25 – 2020-09-07 (×12): 100 mg via ORAL
  Filled 2020-08-24 (×15): qty 1

## 2020-08-24 MED ORDER — SUFENTANIL CITRATE 50 MCG/ML IV SOLN
INTRAVENOUS | Status: DC | PRN
  Administered 2020-08-24 (×2): 10 ug via INTRAVENOUS

## 2020-08-24 MED ORDER — HEPARIN SODIUM (PORCINE) 1000 UNIT/ML IJ SOLN
INTRAMUSCULAR | Status: DC | PRN
  Administered 2020-08-24: 5000 [IU] via INTRAVENOUS

## 2020-08-24 MED ORDER — PROTAMINE SULFATE 10 MG/ML IV SOLN
INTRAVENOUS | Status: AC
  Filled 2020-08-24: qty 5

## 2020-08-24 MED ORDER — OXYCODONE HCL 5 MG PO TABS: 5.0000 mg | ORAL_TABLET | Freq: Once | ORAL | Status: DC | PRN

## 2020-08-24 MED ORDER — ONDANSETRON HCL 4 MG/2ML IJ SOLN: 4.0000 mg | Freq: Once | INTRAMUSCULAR | Status: DC | PRN

## 2020-08-24 MED ORDER — LACTATED RINGERS IV SOLN
INTRAVENOUS | Status: DC | PRN
Start: 2020-08-24 — End: 2020-08-24

## 2020-08-24 MED ORDER — CHLORHEXIDINE GLUCONATE CLOTH 2 % EX PADS
6.0000 | MEDICATED_PAD | Freq: Every day | CUTANEOUS | Status: DC
  Administered 2020-08-25: 06:00:00 6 via TOPICAL

## 2020-08-24 MED ORDER — DEXAMETHASONE SODIUM PHOSPHATE 10 MG/ML IJ SOLN
INTRAMUSCULAR | Status: DC | PRN
  Administered 2020-08-24: 10 mg via INTRAVENOUS

## 2020-08-24 MED ORDER — PHENYLEPHRINE 40 MCG/ML (10ML) SYRINGE FOR IV PUSH (FOR BLOOD PRESSURE SUPPORT)
PREFILLED_SYRINGE | INTRAVENOUS | Status: AC
  Filled 2020-08-24: qty 10

## 2020-08-24 MED ORDER — LIDOCAINE HCL (CARDIAC) PF 100 MG/5ML IV SOSY
PREFILLED_SYRINGE | INTRAVENOUS | Status: DC | PRN
  Administered 2020-08-24: 100 mg via INTRAVENOUS

## 2020-08-24 MED ORDER — POTASSIUM CHLORIDE CRYS ER 20 MEQ PO TBCR: 20.0000 meq | EXTENDED_RELEASE_TABLET | Freq: Every day | ORAL | Status: DC | PRN

## 2020-08-24 MED ORDER — PROPOFOL 10 MG/ML IV BOLUS
INTRAVENOUS | Status: DC | PRN
  Administered 2020-08-24: 80 mg via INTRAVENOUS

## 2020-08-24 MED ORDER — CEPHALEXIN 500 MG PO CAPS: 500.0000 mg | ORAL_CAPSULE | Freq: Three times a day (TID) | ORAL | Status: DC

## 2020-08-24 MED ORDER — HEPARIN SODIUM (PORCINE) 1000 UNIT/ML IJ SOLN
INTRAMUSCULAR | Status: AC
  Filled 2020-08-24: qty 1

## 2020-08-24 MED ORDER — SUFENTANIL CITRATE 50 MCG/ML IV SOLN
INTRAVENOUS | Status: AC
  Filled 2020-08-24: qty 1

## 2020-08-24 MED ORDER — SODIUM CHLORIDE (PF) 0.9 % IJ SOLN
INTRAMUSCULAR | Status: AC
  Filled 2020-08-24: qty 10

## 2020-08-24 MED ORDER — GLYCOPYRROLATE PF 0.2 MG/ML IJ SOSY
PREFILLED_SYRINGE | INTRAMUSCULAR | Status: AC
  Filled 2020-08-24: qty 1

## 2020-08-24 MED ORDER — MIDAZOLAM HCL 2 MG/2ML IJ SOLN
INTRAMUSCULAR | Status: AC
  Filled 2020-08-24: qty 2

## 2020-08-24 MED ORDER — OXYCODONE-ACETAMINOPHEN 5-325 MG PO TABS
1.0000 | ORAL_TABLET | ORAL | Status: DC | PRN
  Administered 2020-08-24 – 2020-08-25 (×2): 1 via ORAL
  Administered 2020-08-25: 2 via ORAL
  Administered 2020-08-25 – 2020-09-01 (×16): 1 via ORAL
  Administered 2020-09-01 – 2020-09-02 (×5): 2 via ORAL
  Administered 2020-09-03 – 2020-09-08 (×20): 1 via ORAL
  Filled 2020-08-24 (×9): qty 1
  Filled 2020-08-24 (×2): qty 2
  Filled 2020-08-24 (×3): qty 1
  Filled 2020-08-24: qty 2
  Filled 2020-08-24: qty 1
  Filled 2020-08-24: qty 2
  Filled 2020-08-24 (×2): qty 1
  Filled 2020-08-24 (×2): qty 2
  Filled 2020-08-24 (×10): qty 1
  Filled 2020-08-24: qty 2
  Filled 2020-08-24 (×5): qty 1
  Filled 2020-08-24: qty 2
  Filled 2020-08-24 (×8): qty 1

## 2020-08-24 MED ORDER — ALUM & MAG HYDROXIDE-SIMETH 200-200-20 MG/5ML PO SUSP: 15.0000 mL | ORAL | Status: DC | PRN

## 2020-08-24 MED ORDER — CEFAZOLIN SODIUM-DEXTROSE 2-3 GM-%(50ML) IV SOLR
INTRAVENOUS | Status: DC | PRN
  Administered 2020-08-24: 2 g via INTRAVENOUS

## 2020-08-24 MED ORDER — SODIUM CHLORIDE 0.9 % IV SOLN: 500.0000 mL | Freq: Once | INTRAVENOUS | Status: DC | PRN

## 2020-08-24 MED ORDER — LIDOCAINE 2% (20 MG/ML) 5 ML SYRINGE
INTRAMUSCULAR | Status: AC
  Filled 2020-08-24: qty 5

## 2020-08-24 MED ORDER — FENTANYL CITRATE (PF) 100 MCG/2ML IJ SOLN
25.0000 ug | INTRAMUSCULAR | Status: DC | PRN
  Administered 2020-08-24: 50 ug via INTRAVENOUS

## 2020-08-24 MED ORDER — DEXAMETHASONE SODIUM PHOSPHATE 10 MG/ML IJ SOLN
INTRAMUSCULAR | Status: AC
  Filled 2020-08-24: qty 1

## 2020-08-24 MED ORDER — CEFAZOLIN SODIUM 1 G IJ SOLR
INTRAMUSCULAR | Status: AC
  Filled 2020-08-24: qty 20

## 2020-08-24 MED ORDER — FENTANYL CITRATE (PF) 100 MCG/2ML IJ SOLN
INTRAMUSCULAR | Status: AC
  Administered 2020-08-24: 11:00:00 50 ug via INTRAVENOUS
  Filled 2020-08-24: qty 2

## 2020-08-24 MED ORDER — 0.9 % SODIUM CHLORIDE (POUR BTL) OPTIME
TOPICAL | Status: DC | PRN
  Administered 2020-08-24 (×2): 1000 mL

## 2020-08-24 MED ORDER — CEFAZOLIN SODIUM-DEXTROSE 2-4 GM/100ML-% IV SOLN
2.0000 g | Freq: Three times a day (TID) | INTRAVENOUS | Status: AC
  Administered 2020-08-24 (×2): 2 g via INTRAVENOUS
  Filled 2020-08-24 (×2): qty 100

## 2020-08-24 MED ORDER — SUGAMMADEX SODIUM 200 MG/2ML IV SOLN
INTRAVENOUS | Status: DC | PRN
  Administered 2020-08-24: 200 mg via INTRAVENOUS

## 2020-08-24 MED ORDER — ONDANSETRON HCL 4 MG/2ML IJ SOLN
INTRAMUSCULAR | Status: AC
  Filled 2020-08-24: qty 2

## 2020-08-24 MED ORDER — OXYCODONE HCL 5 MG/5ML PO SOLN
5.0000 mg | Freq: Once | ORAL | Status: DC | PRN
Start: 2020-08-24 — End: 2020-08-24

## 2020-08-24 MED ORDER — PHENOL 1.4 % MT LIQD: 1.0000 | OROMUCOSAL | Status: DC | PRN

## 2020-08-24 MED ORDER — PHENYLEPHRINE HCL (PRESSORS) 10 MG/ML IV SOLN
INTRAVENOUS | Status: DC | PRN
  Administered 2020-08-24: 80 ug via INTRAVENOUS

## 2020-08-24 MED ORDER — PANTOPRAZOLE SODIUM 40 MG PO TBEC
40.0000 mg | DELAYED_RELEASE_TABLET | Freq: Every day | ORAL | Status: DC
  Administered 2020-08-24 – 2020-09-08 (×16): 40 mg via ORAL
  Filled 2020-08-24 (×16): qty 1

## 2020-08-24 MED ORDER — PROTAMINE SULFATE 10 MG/ML IV SOLN
INTRAVENOUS | Status: DC | PRN
  Administered 2020-08-24: 25 mg via INTRAVENOUS

## 2020-08-24 MED ORDER — SODIUM CHLORIDE 0.9 % IV SOLN: INTRAVENOUS | Status: DC

## 2020-08-24 MED ORDER — PHENYLEPHRINE HCL-NACL 10-0.9 MG/250ML-% IV SOLN
INTRAVENOUS | Status: DC | PRN
  Administered 2020-08-24: 50 ug/min via INTRAVENOUS

## 2020-08-24 MED ORDER — MIDAZOLAM HCL 2 MG/2ML IJ SOLN
INTRAMUSCULAR | Status: DC | PRN
  Administered 2020-08-24: 2 mg via INTRAVENOUS

## 2020-08-24 SURGICAL SUPPLY — 66 items
BAG DECANTER FOR FLEXI CONT (MISCELLANEOUS) ×2 IMPLANT
BAG ISOLATION DRAPE 18X18 (DRAPES) ×1 IMPLANT
BANDAGE ESMARK 6X9 LF (GAUZE/BANDAGES/DRESSINGS) ×1 IMPLANT
BNDG ESMARK 6X9 LF (GAUZE/BANDAGES/DRESSINGS) ×2
CANISTER SUCT 3000ML PPV (MISCELLANEOUS) ×2 IMPLANT
CANNULA VESSEL 3MM 2 BLNT TIP (CANNULA) ×2 IMPLANT
CLIP VESOCCLUDE MED 24/CT (CLIP) ×2 IMPLANT
CLIP VESOCCLUDE SM WIDE 24/CT (CLIP) ×2 IMPLANT
CUFF TOURN SGL QUICK 24 (TOURNIQUET CUFF) ×2
CUFF TOURN SGL QUICK 34 (TOURNIQUET CUFF)
CUFF TOURN SGL QUICK 42 (TOURNIQUET CUFF) IMPLANT
CUFF TRNQT CYL 24X4X16.5-23 (TOURNIQUET CUFF) ×1 IMPLANT
CUFF TRNQT CYL 34X4.125X (TOURNIQUET CUFF) IMPLANT
DERMABOND ADVANCED (GAUZE/BANDAGES/DRESSINGS) ×2
DERMABOND ADVANCED .7 DNX12 (GAUZE/BANDAGES/DRESSINGS) ×2 IMPLANT
DRAIN CHANNEL 15F RND FF W/TCR (WOUND CARE) IMPLANT
DRAPE C-ARM 42X72 X-RAY (DRAPES) IMPLANT
DRAPE HALF SHEET 40X57 (DRAPES) IMPLANT
DRAPE INCISE IOBAN 66X45 STRL (DRAPES) ×2 IMPLANT
DRAPE ISOLATION BAG 18X18 (DRAPES) ×2
DRAPE WARM FLUID 44X44 (DRAPES) ×2 IMPLANT
DRAPE X-RAY CASS 24X20 (DRAPES) IMPLANT
ELECT REM PT RETURN 9FT ADLT (ELECTROSURGICAL) ×2
ELECTRODE REM PT RTRN 9FT ADLT (ELECTROSURGICAL) ×1 IMPLANT
EVACUATOR SILICONE 100CC (DRAIN) IMPLANT
GLOVE BIO SURGEON STRL SZ7.5 (GLOVE) ×2 IMPLANT
GLOVE BIOGEL PI IND STRL 7.5 (GLOVE) ×1 IMPLANT
GLOVE BIOGEL PI INDICATOR 7.5 (GLOVE) ×1
GLOVE SURG SS PI 6.5 STRL IVOR (GLOVE) ×16 IMPLANT
GLOVE SURG UNDER POLY LF SZ6.5 (GLOVE) ×4 IMPLANT
GOWN STRL REUS W/ TWL LRG LVL3 (GOWN DISPOSABLE) ×6 IMPLANT
GOWN STRL REUS W/ TWL XL LVL3 (GOWN DISPOSABLE) ×1 IMPLANT
GOWN STRL REUS W/TWL LRG LVL3 (GOWN DISPOSABLE) ×12
GOWN STRL REUS W/TWL XL LVL3 (GOWN DISPOSABLE) ×2
GRAFT PROPATEN W/RING 6X80X60 (Vascular Products) ×2 IMPLANT
HEMOSTAT SNOW SURGICEL 2X4 (HEMOSTASIS) ×2 IMPLANT
INSERT FOGARTY SM (MISCELLANEOUS) ×4 IMPLANT
KIT BASIN OR (CUSTOM PROCEDURE TRAY) ×2 IMPLANT
KIT TURNOVER KIT B (KITS) ×2 IMPLANT
LOOP VESSEL MAXI BLUE (MISCELLANEOUS) ×2 IMPLANT
MARKER GRAFT CORONARY BYPASS (MISCELLANEOUS) IMPLANT
NS IRRIG 1000ML POUR BTL (IV SOLUTION) ×4 IMPLANT
PACK PERIPHERAL VASCULAR (CUSTOM PROCEDURE TRAY) ×2 IMPLANT
PACK UNIVERSAL I (CUSTOM PROCEDURE TRAY) ×2 IMPLANT
PAD ARMBOARD 7.5X6 YLW CONV (MISCELLANEOUS) ×4 IMPLANT
SET COLLECT BLD 21X3/4 12 (NEEDLE) IMPLANT
SPONGE LAP 18X18 RF (DISPOSABLE) ×2 IMPLANT
STOPCOCK 4 WAY LG BORE MALE ST (IV SETS) IMPLANT
SUT ETHILON 3 0 PS 1 (SUTURE) IMPLANT
SUT MNCRL AB 4-0 PS2 18 (SUTURE) ×4 IMPLANT
SUT PROLENE 5 0 C 1 24 (SUTURE) ×2 IMPLANT
SUT PROLENE 5 0 C 1 36 (SUTURE) ×2 IMPLANT
SUT PROLENE 6 0 BV (SUTURE) ×2 IMPLANT
SUT PROLENE 7 0 BV 1 (SUTURE) IMPLANT
SUT SILK 2 0 SH (SUTURE) ×2 IMPLANT
SUT SILK 3 0 (SUTURE)
SUT SILK 3-0 18XBRD TIE 12 (SUTURE) IMPLANT
SUT VIC AB 2-0 CT1 27 (SUTURE) ×4
SUT VIC AB 2-0 CT1 TAPERPNT 27 (SUTURE) ×2 IMPLANT
SUT VIC AB 3-0 SH 27 (SUTURE) ×4
SUT VIC AB 3-0 SH 27X BRD (SUTURE) ×2 IMPLANT
SYR 20ML LL LF (SYRINGE) ×2 IMPLANT
TOWEL GREEN STERILE (TOWEL DISPOSABLE) ×2 IMPLANT
TRAY FOLEY MTR SLVR 16FR STAT (SET/KITS/TRAYS/PACK) ×2 IMPLANT
UNDERPAD 30X36 HEAVY ABSORB (UNDERPADS AND DIAPERS) ×2 IMPLANT
WATER STERILE IRR 1000ML POUR (IV SOLUTION) ×2 IMPLANT

## 2020-08-24 NOTE — Anesthesia Procedure Notes (Signed)
Procedure Name: Intubation Date/Time: 08/24/2020 7:52 AM Performed by: Claris Che, CRNA Pre-anesthesia Checklist: Patient identified, Emergency Drugs available, Suction available, Patient being monitored and Timeout performed Patient Re-evaluated:Patient Re-evaluated prior to induction Oxygen Delivery Method: Circle system utilized Preoxygenation: Pre-oxygenation with 100% oxygen Induction Type: IV induction and Cricoid Pressure applied Ventilation: Mask ventilation without difficulty Laryngoscope Size: Mac and 4 Grade View: Grade II Tube type: Oral Tube size: 7.5 mm Number of attempts: 1 Airway Equipment and Method: Stylet Placement Confirmation: ETT inserted through vocal cords under direct vision,  positive ETCO2 and breath sounds checked- equal and bilateral Secured at: 22 cm Tube secured with: Tape Dental Injury: Teeth and Oropharynx as per pre-operative assessment

## 2020-08-24 NOTE — Anesthesia Postprocedure Evaluation (Signed)
Anesthesia Post Note  Patient: Mercedes [name]  Procedure(s) Performed: RIGHT FEMORAL-BELOW KNEE POPLITEAL ARTERY BYPASS USING GORE PROPATEN GRAFT (Right )     Patient location during evaluation: PACU Anesthesia Type: General Level of consciousness: awake and alert Pain management: pain level controlled Vital Signs Assessment: post-procedure vital signs reviewed and stable Respiratory status: spontaneous breathing, nonlabored ventilation and respiratory function stable Cardiovascular status: blood pressure returned to baseline and stable Postop Assessment: no apparent nausea or vomiting Anesthetic complications: no   No complications documented.  Last Vitals:  Vitals:   08/24/20 1140 08/24/20 1253  BP: 135/72 122/61  Pulse: 60 62  Resp: 11 13  Temp: 36.5 C   SpO2: 94% 96%    Last Pain:  Vitals:   08/24/20 1140  TempSrc: Oral  PainSc: Asleep                 [name]

## 2020-08-24 NOTE — Transfer of Care (Signed)
Immediate Anesthesia Transfer of Care Note  Patient: Mercedes [name]  Procedure(s) Performed: RIGHT FEMORAL-BELOW KNEE POPLITEAL ARTERY BYPASS USING GORE PROPATEN GRAFT (Right )  Patient Location: PACU  Anesthesia Type:General  Level of Consciousness: awake, alert , oriented and patient cooperative  Airway & Oxygen Therapy: Patient Spontanous Breathing and Patient connected to nasal cannula oxygen  Post-op Assessment: Report given to RN, Post -op Vital signs reviewed and stable and Patient moving all extremities X 4  Post vital signs: Reviewed and stable  Last Vitals:  Vitals Value Taken Time  BP 163/61 08/24/20 1018  Temp    Pulse 68 08/24/20 1021  Resp 8 08/24/20 1021  SpO2 99 % 08/24/20 1021  Vitals shown include unvalidated device data.  Last Pain:  Vitals:   08/24/20 0630  TempSrc:   PainSc: Asleep      Patients Stated Pain Goal: 2 (08/23/20 2054)  Complications: No complications documented.

## 2020-08-24 NOTE — Interval H&P Note (Signed)
History and Physical Interval Note:  08/24/2020 7:22 AM  Mercedes [name]  has presented today for surgery, with the diagnosis of PAD.  The various methods of treatment have been discussed with the patient and family. After consideration of risks, benefits and other options for treatment, the patient has consented to  Procedure(s): RIGHT BYPASS GRAFT FEMORAL-POPLITEAL ARTERY (Right) as a surgical intervention.  The patient's history has been reviewed, patient examined, no change in status, stable for surgery.  I have reviewed the patient's chart and labs.  Questions were answered to the patient's satisfaction.     Lemar Livings

## 2020-08-24 NOTE — Op Note (Signed)
Patient name: Mercedes [name] MRN: [id number] DOB: March 03, 1956 Sex: female  08/24/2020 Pre-operative Diagnosis: Critical right lower extremity ischemia with toe ulceration Post-operative diagnosis:  Same Surgeon:  Apolinar Junes C. Randie Heinz, MD Assistant: Clinton Gallant, PA Procedure Performed: 1.  Right common femoral, profundofemoral and SFA endarterectomy 2.  Right common femoral to below-knee popliteal artery bypass with 6 mm ringed PTFE  Indications: 64 year old female has previously undergone left lower extremity bypass with subsequent above-knee amputation.  She underwent angiogram the day prior to this procedure which demonstrated occluded common femoral artery on the right as well as occluded SFA with reconstitution of diseased above-knee popliteal artery with single-vessel runoff via the peroneal artery in line from what appeared to be a healthier below-knee popliteal artery.  She was subsequently indicated for the above-noted procedure.  An assistant was necessary for exposure, suction, retraction and assistance with anastomosis and tunneling.  Findings: Common femoral artery was occluded.  This was significantly thickened and calcified.  After endarterectomy extending down to the profunda femoris artery we were able to establish strong backbleeding.  We also performed endarterectomy of the SFA did not establish any backbleeding.  We performed endarterectomy of the entire common femoral artery up onto the inguinal ligament did have a very strong inflow at this time.  Below the knee the popliteal artery was thickened the tibioperoneal trunk was calcified as was the intertibial artery I elected not to clamp these vessels a tourniquet was used.  At completion of bypass there was a good signal in the tibioperoneal trunk that was graft dependent and also a signal of the peroneal artery that was graft dependent although this was monophasic.   Procedure:  The patient was identified in the holding area  and taken to the operating where she was placed supine on operative when general anesthesia was induced.  She was sterilely prepped and draped in the right lower extremity usual fashion antibiotics were ministered and a timeout was called.  Ultrasound was used to search for a saphenous vein this had been harvested for previous coronary artery bypass grafting I did not identify any suitable segment of saphenous vein for bypass use.  I also identified the common femoral artery extending down to the profunda and I elected to make a vertical type incision overlying this.  We then dissected down to the common femoral artery there was significant scar tissue likely from previous catheterizations.  We dissected up under the inguinal ligament and divided the crossing vein.  There was a pulse in the external iliac artery and placed a vessel loop around this.  We then dissected further inferiorly identified the profunda placed a vessel loop around this.  There was some large branches that are placed Vesseloops around as well in the SFA was chronically occluded.  I then turned my attention distally an incision was made on the medial aspect of the leg just below the knee.  We dissected down there was a remnant of saphenous vein which was ligated.  There were clips from her previous ligation of her saphenous vein.  These were excised as well.  We open the fascial dissected to the deep space identified the popliteal vein.  We identified the popliteal artery dissected this out down to the tibioperoneal trunk and the intertibial artery a vessel loop was placed around this.  A tunnel was placed between the 2 incisions in subfascial plane the patient was fully heparinized.  After 3-minute circulation ACT returned greater than 250 we clamped the  profunda followed by the external iliac artery tightened our Vesseloops on her branch vessels.  We opened the artery for approximately 2 cm just above the profunda.  We performed significant  endarterectomy.  The vessel was noted to be occluded with both soft and calcified tissue.  We did establish good backbleeding from the profunda and this was clamped.  We then dissected in our vessel we were able to get endarterectomy and establish very strong inflow from the external and this was reclamped.  We then tunneled our 6 mm ringed PTFE trimmed to size and sewed it into side with 5-0 Prolene suture.  Upon completion we released our clamps.  We flushed through the graft there was very strong pulsatile flow.  We had a good signal in the profunda and there was a monophasic signal in the SFA proximally.  We turned our attention distally.  We clamped our graft in the groin flushed with heparinized saline.  A tourniquet was placed above the knee.  The graft was maintained to be straight and maintaining orientation with the blue line up.  We then exsanguinated the right lower extremity with Esmarch tourniquet was inflated to 250 mmHg.  The leg was straight and the graft was trimmed to size.  We open the below-knee popliteal artery this was somewhat thickened although free of any flow-limiting stenoses.  We then sewed our graft end-to-side with 6-0 Prolene suture.  Prior completion without flushing all directions.  Upon completion there was good signal in the tibioperoneal trunk that was graft dependent.  There is also signal extending retrograde up into the popliteal.  Identified the profunda and anterior tibial signal that was graft dependent at the ankle.  We administered 25 mg of protamine we obtain hemostasis irrigated the wounds and closed in layers with Vicryl and Monocryl.  She was awakened from anesthesia having tolerated procedure without any complication.  All counts were correct at completion.  EBL: 50 cc   Darielle Hancher C. Randie Heinz, MD [facility] Office: [phone] Pager: [phone]

## 2020-08-24 NOTE — Anesthesia Procedure Notes (Signed)
Arterial Line Insertion Start/End12/28/2021 7:10 AM, 08/24/2020 7:15 AM Performed by: Samara Deist, CRNA, CRNA  Patient location: Pre-op. Preanesthetic checklist: patient identified, IV checked and pre-op evaluation Patient sedated Left, radial was placed Catheter size: 20 G Hand hygiene performed  and maximum sterile barriers used   Attempts: 3 (Attempt X 2 by VBanks CRNA) Procedure performed without using ultrasound guided technique. Patient tolerated the procedure well with no immediate complications.

## 2020-08-24 NOTE — Progress Notes (Signed)
     S/P right femoral endarterectomy with fem-pop bypass Doppler signals peroneal/AT intact, motor of toes intact Right groin soft without hematoma Popliteal incision healing well  Stable post op disposition PT/OT consults ordered  Mosetta Pigeon PA-C

## 2020-08-24 NOTE — Progress Notes (Signed)
Pt arrived back to 4e from PACU. Incisions clean, dry, and intact. Doppler pulses in right foot. Vitals obtained and stable. A-line in place and reading with good waveform. Foley in place. Will continue current plan of care.

## 2020-08-25 ENCOUNTER — Encounter (HOSPITAL_COMMUNITY): Payer: Self-pay | Admitting: Vascular Surgery

## 2020-08-25 LAB — CBC
HCT: 32.1 % — ABNORMAL LOW (ref 36.0–46.0)
Hemoglobin: 10.1 g/dL — ABNORMAL LOW (ref 12.0–15.0)
MCH: 24.7 pg — ABNORMAL LOW (ref 26.0–34.0)
MCHC: 31.5 g/dL (ref 30.0–36.0)
MCV: 78.5 fL — ABNORMAL LOW (ref 80.0–100.0)
Platelets: 336 10*3/uL (ref 150–400)
RBC: 4.09 MIL/uL (ref 3.87–5.11)
RDW: 16.4 % — ABNORMAL HIGH (ref 11.5–15.5)
WBC: 8.9 10*3/uL (ref 4.0–10.5)
nRBC: 0 % (ref 0.0–0.2)

## 2020-08-25 LAB — BASIC METABOLIC PANEL
Anion gap: 9 (ref 5–15)
BUN: 12 mg/dL (ref 8–23)
CO2: 22 mmol/L (ref 22–32)
Calcium: 8.9 mg/dL (ref 8.9–10.3)
Chloride: 102 mmol/L (ref 98–111)
Creatinine, Ser: 0.75 mg/dL (ref 0.44–1.00)
GFR, Estimated: >60 mL/min (ref >60)
Glucose, Bld: 98 mg/dL (ref 70–99)
Potassium: 4.2 mmol/L (ref 3.5–5.1)
Sodium: 133 mmol/L — ABNORMAL LOW (ref 135–145)

## 2020-08-25 LAB — LIPID PANEL
Cholesterol: 139 mg/dL (ref 0–200)
HDL: 48 mg/dL (ref >40)
LDL Cholesterol: 77 mg/dL (ref 0–99)
Total CHOL/HDL Ratio: 2.9 RATIO
Triglycerides: 71 mg/dL (ref <150)
VLDL: 14 mg/dL (ref 0–40)

## 2020-08-25 MED ORDER — APIXABAN 5 MG PO TABS
5.0000 mg | ORAL_TABLET | Freq: Two times a day (BID) | ORAL | Status: AC
  Administered 2020-08-25 – 2020-08-28 (×7): 5 mg via ORAL
  Filled 2020-08-25 (×8): qty 1

## 2020-08-25 NOTE — Progress Notes (Signed)
The patient's mood has changed. She's becoming easily angered and defensive.  She believes the night nurse was given orders to give her water right at the start of shift and to do a bladder scan on her at 8 PM, but no such orders were given to the nurse. The nurse was instructed that if the patient is unable to void to check the bladder volume and insert a foley catheter if there is > 350 cc of urine in the bladder.The nurse did ask the patient if she needed anything at the beginning of the shift and the patient said no.  The nurse stated to the patient that she would be in the room between 9 and 10:30 PM to do a bladder scan.  When the nurse and nurse tech arrived to do a bladder scan the patient refused.  The patient also refused to take her medicines, stating, "You're trying to force them on me."  Will leave the patient alone for now and will try later tonight to at least get a bladder scan done.  Harriet Masson, RN

## 2020-08-25 NOTE — Progress Notes (Signed)
Foley catheter removed (POD 1) per order/protocol. Patient tolerated procedure well. Due to void.

## 2020-08-25 NOTE — Evaluation (Signed)
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  Underwent Right common femoral, profundofemoral and SFA endarterectomy and Right common femoral to below-knee popliteal artery bypass 12/28.  Clinical Impression  PTA pt was living alone in single story home with 5 steps to enter (pt family is currently working on ramp to enter.) Pt reports she was using a RW for short distance mobility and wheelchair for longer distance. Pt is independent with bathing and dressing, gets some help with house work and cooking. Pt is currently limited in safe mobility by R groin pain and R foot pain with light touch, unable to bear weight. Pt is max A for coming to EoB and total A for lateral scooting from bed to recliner. Pt has new L prosthetic and is very motivated to start using it however she has been limited by her R foot pain. Pt is very independent and will to anything to return to PLOF. Family supportive. PT recommending CIR level rehab at discharge due to motivation. PT will continue to follow acutely.     Follow Up Recommendations CIR    Equipment Recommendations  Other (comment) (Has necessary equipment)    Recommendations for Other Services Rehab consult     Precautions / Restrictions Precautions Precautions: Fall Restrictions Weight Bearing Restrictions: No      Mobility  Bed Mobility Overal bed mobility: Needs Assistance Bed Mobility: Supine to Sit Rolling: Supervision   Supine to sit: Max assist Sit to supine: Min assist   General bed mobility comments: max A due to pain for forward flexion    Transfers Overall transfer level: Needs assistance Equipment used: None Transfers: Lateral/Scoot Transfers           General transfer comment: Total A for scooting to chair on her R, R foot elevated  throughout          Balance Overall balance assessment: Needs assistance Sitting-balance support: Bilateral upper extremity supported;Feet unsupported Sitting balance-Leahy Scale: Fair   Postural control: Posterior lean                                   Pertinent Vitals/Pain Pain Assessment: 0-10 Pain Score: 10-Worst pain ever Pain Location: R groin incision Pain Descriptors / Indicators: Stabbing;Sharp Pain Intervention(s): Limited activity within patient's tolerance;Monitored during session;Repositioned;Premedicated before session    Home Living Family/patient expects to be discharged to:: Private residence Living Arrangements: Alone Available Help at Discharge: Family;Available PRN/intermittently Type of Home: House Home Access: Stairs to enter   Entrance Stairs-Number of Steps: 5 to back door - family is constructing a ramp. Home Layout: One level Home Equipment: Walker - 2 wheels;Cane - single point;Tub bench;Bedside commode;Hand held shower head;Adaptive equipment;Wheelchair - manual      Prior Function Level of Independence: Independent with assistive device(s)         Comments: Useing wheelchair more lately due to R leg pain.  Cares for self at wheelcahir level. Drives.  Able to transfer to tub seat for bathing and dressing.  No assist with meals or light home management.     Hand Dominance   Dominant Hand: Right    Extremity/Trunk Assessment   Upper Extremity Assessment Upper Extremity Assessment: Defer to OT evaluation    Lower Extremity Assessment Lower Extremity Assessment: RLE deficits/detail;LLE deficits/detail RLE  Deficits / Details: R foot swelling, R great toe swollen and dark, painful to move, R groin incisional pain, RLE: Unable to fully assess due to pain LLE Deficits / Details: L AKA    Cervical / Trunk Assessment Cervical / Trunk Assessment: Normal  Communication   Communication: No difficulties  Cognition  Arousal/Alertness: Awake/alert Behavior During Therapy: WFL for tasks assessed/performed Overall Cognitive Status: Within Functional Limits for tasks assessed                                        General Comments General comments (skin integrity, edema, etc.): VSS on RA        Assessment/Plan    PT Assessment Patient needs continued PT services  PT Problem List Decreased activity tolerance;Decreased balance;Decreased mobility;Pain       PT Treatment Interventions DME instruction;Gait training;Functional mobility training;Therapeutic activities;Therapeutic exercise;Balance training;Cognitive remediation;Patient/family education    PT Goals (Current goals can be found in the Care Plan section)  Acute Rehab PT Goals Patient Stated Goal: Try to get home and start waking again. PT Goal Formulation: With patient Time For Goal Achievement: 09/08/20 Potential to Achieve Goals: Good    Frequency Min 3X/week    AM-PAC PT "6 Clicks" Mobility  Outcome Measure Help needed turning from your back to your side while in a flat bed without using bedrails?: A Little Help needed moving from lying on your back to sitting on the side of a flat bed without using bedrails?: A Lot Help needed moving to and from a bed to a chair (including a wheelchair)?: Total Help needed standing up from a chair using your arms (e.g., wheelchair or bedside chair)?: Total Help needed to walk in hospital room?: Total Help needed climbing 3-5 steps with a railing? : Total 6 Click Score: 9    End of Session   Activity Tolerance: Patient limited by pain Patient left: in chair;with call bell/phone within reach;with chair alarm set Nurse Communication: Mobility status PT Visit Diagnosis: Unsteadiness on feet (R26.81);Other abnormalities of gait and mobility (R26.89);Muscle weakness (generalized) (M62.81);Difficulty in walking, not elsewhere classified (R26.2);Pain    Time: 6629-4765 PT Time  Calculation (min) (ACUTE ONLY): 32 min   Charges:   PT Evaluation $PT Eval Moderate Complexity: 1 Mod PT Treatments $Therapeutic Activity: 8-22 mins        Mercedes [name] B. Beverely Risen PT, DPT Acute Rehabilitation Services Pager [phone] Office [phone]   [name] 08/25/2020, 1:08 PM

## 2020-08-25 NOTE — Progress Notes (Addendum)
[facility]  Subjective  - doing better this am   Objective (!) 137/57 64 97.9 F (36.6 C) (Oral) 12 97%  Intake/Output Summary (Last 24 hours) at 08/25/2020 0707 Last data filed at 08/25/2020 1287 Gross per 24 hour  Intake 2625.52 ml  Output 1235 ml  Net 1390.52 ml    Right LE AT/Peroneal doppler signals Right groin soft without hematoma  Motor intact right foot toes and ankle Lungs non labored breathing  Assessment/Planning: POD # 1  Procedure Performed: 1.  Right common femoral, profundofemoral and SFA endarterectomy 2.  Right common femoral to below-knee popliteal artery bypass with 6 mm ringed PTFE  I will restart her Eliquis this am for history of Afib HGB stable 10.1 Cr stable < 1 urine OP good > 1000 last 24 hours PT/OT for eval and treatment.  Patient has left AKA prothesis   Mosetta Pigeon 08/25/2020 7:07 AM --  Laboratory Lab Results: Recent Labs    08/24/20 0120 08/25/20 0300  WBC 7.1 8.9  HGB 11.5* 10.1*  HCT 36.0 32.1*  PLT 347 336   BMET Recent Labs    08/24/20 0120 08/25/20 0300  NA 136 133*  K 4.3 4.2  CL 104 102  CO2 24 22  GLUCOSE 97 98  BUN 11 12  CREATININE 0.79 0.75  CALCIUM 9.4 8.9    COAG Lab Results  Component Value Date   INR 1.0 11/18/2019   INR 1.0 11/03/2019   INR 1.1 10/01/2019   No results found for: PTT  I have independently interviewed and examined patient and agree with PA assessment and plan above. Per PT will benefit from CIR.   Louisa Favaro C. Randie Heinz, MD [facility] Office: [phone] Pager: [phone]

## 2020-08-25 NOTE — Progress Notes (Signed)
Patient refused to have her foley catheter removed at this time.  Says she wants to wait to take it out on day shift.

## 2020-08-25 NOTE — Evaluation (Addendum)
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  Underwent Right common femoral, profundofemoral and SFA endarterectomy and Right common femoral to below-knee popliteal artery bypass 12/28.   Clinical Impression   Patient admitted for the above diagnosis and procedure.  PTA she was largely wheelchair level due to painful R leg.  She has a prothesis for her L leg, but was only able to walk at a walker level for a few weeks.  Family assists as needed, but she can care for herself at wheelchair level and continues to drive.  Currently, due to the deficit below, she is performing ADL's at bed level and not able to participate with transfers due to pain.      Follow Up Recommendations  CIR    Equipment Recommendations  None recommended by OT    Recommendations for Other Services       Precautions / Restrictions Precautions Precautions: Fall Restrictions Weight Bearing Restrictions: No      Mobility Bed Mobility Overal bed mobility: Needs Assistance Bed Mobility: Rolling;Supine to Sit;Sit to Supine Rolling: Supervision   Supine to sit: Min guard Sit to supine: Min assist        Transfers                 General transfer comment: unable due to pain    Balance Overall balance assessment: Needs assistance Sitting-balance support: Bilateral upper extremity supported;Feet unsupported Sitting balance-Leahy Scale: Fair   Postural control: Posterior lean                                 ADL either performed or assessed with clinical judgement   ADL Overall ADL's : Needs assistance/impaired Eating/Feeding: Independent;Bed level   Grooming: Wash/dry hands;Wash/dry face;Set up;Bed level   Upper Body Bathing: Set up;Bed level   Lower Body Bathing: Moderate assistance;Bed level        Lower Body Dressing: Moderate assistance;Bed level                 General ADL Comments: unable due to pain     Vision Baseline Vision/History: Wears glasses Patient Visual Report: No change from baseline       Perception     Praxis      Pertinent Vitals/Pain Pain Assessment: 0-10 Pain Score: 10-Worst pain ever Pain Location: R groin incision Pain Descriptors / Indicators: Stabbing;Sharp Pain Intervention(s): Limited activity within patient's tolerance     Hand Dominance Right   Extremity/Trunk Assessment Upper Extremity Assessment Upper Extremity Assessment: Overall WFL for tasks assessed   Lower Extremity Assessment Lower Extremity Assessment: Defer to PT evaluation   Cervical / Trunk Assessment Cervical / Trunk Assessment: Normal   Communication Communication Communication: No difficulties   Cognition Arousal/Alertness: Awake/alert Behavior During Therapy: WFL for tasks assessed/performed Overall Cognitive Status: Within Functional Limits for tasks assessed                                     General Comments   HR to 83 BPM    Exercises     Shoulder Instructions      Home Living Family/patient expects to be discharged to:: Private residence Living Arrangements: Alone Available Help at Discharge: Family;Available PRN/intermittently Type  of Home: House Home Access: Stairs to enter [employer] of Steps: 5 to back door - family is constructing a ramp.   Home Layout: One level     Bathroom Shower/Tub: Chief Strategy Officer: Standard Bathroom Accessibility: Yes How Accessible: Accessible via wheelchair Home Equipment: Walker - 2 wheels;Cane - single point;Tub bench;Bedside commode;Hand held shower head;Adaptive equipment;Wheelchair - Equities trader: Reacher        Prior Functioning/Environment Level of Independence: Independent with assistive device(s)        Comments: Useing  whellchair more lately die to R leg pain.  Cares for self at wheelcahir level. Drives.  Able to transfer to teb seat for bathing and dressing.  No assist with meals or light home management.        OT Problem List: Decreased strength;Decreased activity tolerance;Impaired balance (sitting and/or standing);Pain      OT Treatment/Interventions: Self-care/ADL training;DME and/or AE instruction;Balance training;Therapeutic activities    OT Goals(Current goals can be found in the care plan section) Acute Rehab OT Goals Patient Stated Goal: Try to get home and start waking again. OT Goal Formulation: With patient Time For Goal Achievement: 09/08/20 Potential to Achieve Goals: Good ADL Goals Pt Will Perform Grooming: with set-up;sitting Pt Will Perform Lower Body Bathing: with supervision;sitting/lateral leans Pt Will Perform Lower Body Dressing: with supervision;sitting/lateral leans Pt Will Transfer to Toilet: with min assist;bedside commode;stand pivot transfer Pt Will Perform Toileting - Clothing Manipulation and hygiene: with min guard assist;sitting/lateral leans  OT Frequency: Min 2X/week   Barriers to D/C:    none noted       Co-evaluation              AM-PAC OT "6 Clicks" Daily Activity     Outcome Measure Help from another person eating meals?: None Help from another person taking care of personal grooming?: None Help from another person toileting, which includes using toliet, bedpan, or urinal?: A Lot Help from another person bathing (including washing, rinsing, drying)?: A Lot Help from another person to put on and taking off regular upper body clothing?: A Little Help from another person to put on and taking off regular lower body clothing?: A Lot 6 Click Score: 17   End of Session Nurse Communication: Patient requests pain meds  Activity Tolerance: Patient limited by pain Patient left: in bed;with call bell/phone within reach  OT Visit Diagnosis: Pain Pain -  Right/Left: Right Pain - part of body: Leg                Time: 6045-4098 OT Time Calculation (min): 31 min Charges:  OT General Charges $OT Visit: 1 Visit OT Evaluation $OT Eval Moderate Complexity: 1 Mod OT Treatments $Self Care/Home Management : 8-22 mins  08/25/2020  Rich, OTR/L  Acute Rehabilitation Services  Office:  [phone]   Mercedes [name] 08/25/2020, 9:50 AM

## 2020-08-25 NOTE — Progress Notes (Signed)
Rehab Admissions Coordinator Note:  Per PT and OT recommendation, this patient was screened by Cheri Rous for appropriateness for an Inpatient Acute Rehab Consult.  At this time, we are recommending Inpatient Rehab consult.  Please have attending service place consult order for further assessment and possible CIR admit.   Cheri Rous 08/25/2020, 5:19 PM  I can be reached at [phone].

## 2020-08-25 NOTE — Progress Notes (Signed)
Foley Catheter removed at 0800, pt due to void.  Bladder scan at 1400 volume 113 ml.  Per Dr. Randie Heinz, MD verbal order received to place Foley catheter if patient is unable to void and bladder scan volume is > 350 ml.

## 2020-08-25 NOTE — Progress Notes (Signed)
Mobility Specialist - Progress Note   08/25/20 1311  Mobility  Activity Transferred:  Chair to bed  Level of Assistance Maximum assist, patient does 25-49%  Assistive Device None  Mobility Response Tolerated fair  Mobility performed by Mobility specialist  $Mobility charge 1 Mobility   Pt max assist w/ use of bed pads to scoot laterally from drop-arm recliner to bed. Pt mainly limited by RLE pain. She was not wanting to do any UE exercises due to the pain. VSS. RN present during session.   Mercedes [name] Mobility Specialist Mobility Specialist Phone: [phone]

## 2020-08-25 NOTE — Progress Notes (Signed)
PHARMACIST LIPID MONITORING   Mercedes [name] is a 64 y.o. female admitted on 08/23/2020 for RLE aortogram. S/p endarterectomy and bypass 08/24/20. Pharmacy has been consulted to optimize lipid-lowering therapy with the indication of secondary prevention for clinical ASCVD.  Recent Labs:  Lipid Panel (last 6 months):   Lab Results  Component Value Date   CHOL 139 08/25/2020   TRIG 71 08/25/2020   HDL 48 08/25/2020   CHOLHDL 2.9 08/25/2020   VLDL 14 08/25/2020   LDLCALC 77 08/25/2020    Hepatic function panel (last 6 months):   No results found for: AST, ALT, ALKPHOS, BILITOT, BILIDIR, IBILI   11/18/19 LFTs normal  SCr (since admission):   Serum creatinine: 0.75 mg/dL 11/15/20 4825 Estimated creatinine clearance: 44.1 mL/min  Current lipid-lowering therapy:  Crestor 40 mg daily Previous lipid-lowering therapies (if applicable): Crestor 10 mg daily, dates uncertain Documented or reported allergies or intolerances to lipid-lowering therapies (if applicable): History of abnormal LFTs in the past when on Lipitor.  Assessment:    LDL 77.  Prior values ranged from 63-94.   TG 71.  Recommendation per protocol:   Continue current lipid-lowering therapy.   Plan:  Continue Crestor 40 mg daily.  Dennie Fetters, RPh 08/25/2020, 2:31 PM

## 2020-08-25 NOTE — Discharge Instructions (Addendum)
Toe Amputation Toe amputation is a surgical procedure to remove all or part of a toe. A person may have this procedure if:  Tissue in the toe is dying because of poor blood supply.  There is a severe infection in the toe.  There is a cancerous tumor in the toe.  There is a traumatic injury in the toe. Removing the toe keeps nearby tissue healthy. If the toe is infected, removing it helps to keep the infection from spreading. Tell a health care provider about:  Any allergies you have.  All medicines you are taking, including vitamins, herbs, eye drops, creams, and over-the-counter medicines.  Any problems you or family members have had with anesthetic medicines.  Any blood disorders you have.  Any surgeries you have had.  Any medical conditions you have or have had.  Whether you are pregnant or may be pregnant. What are the risks? Generally, this is a safe procedure. However, problems may occur, including:  Bleeding.  Buildup of blood and fluid (hematoma).  Infection.  Allergic reactions to medicines.  Tissue death in the flap of skin (flap necrosis).  Trouble with healing.  Minor changes in the way that you walk (your gait).  Feeling pain in the area that was removed (phantom pain). This is rare. What happens before the procedure? Staying hydrated Follow instructions from your health care provider about hydration, which may include:  Up to 2 hours before the procedure - you may continue to drink clear liquids, such as water, clear fruit juice, black coffee, and plain tea.   Eating and drinking restrictions Follow instructions from your health care provider about eating and drinking, which may include:  8 hours before the procedure - stop eating heavy meals or foods, such as meat, fried foods, or fatty foods.  6 hours before the procedure - stop eating light meals or foods, such as toast or cereal.  6 hours before the procedure - stop drinking milk or drinks  that contain milk.  2 hours before the procedure - stop drinking clear liquids. Medicines Ask your health care provider about:  Changing or stopping your regular medicines. This is especially important if you are taking diabetes medicines or blood thinners.  Taking medicines such as aspirin and ibuprofen. These medicines can thin your blood. Do not take these medicines unless your health care provider tells you to take them.  Taking over-the-counter medicines, vitamins, herbs, and supplements. General instructions  Ask your health care provider: ? How your surgery site will be marked. ? What steps will be taken to help prevent infection. These may include:  Washing skin with a germ-killing soap.  Taking antibiotic medicine.  Plan to have someone take you home from the hospital or clinic.  If you will be going home right after the procedure, plan to have someone with you for 24 hours. What happens during the procedure?  An IV will be inserted into one of your veins.  You will be given one or more of the following: ? A medicine to help you relax (sedative). ? A medicine to numb the area (local anesthetic). ? A medicine to make you fall asleep (general anesthetic). ? A medicine that is injected into your spine to numb the area below and slightly above the injection site (spinal anesthetic). ? A medicine that is injected into an area of your body to numb everything below the injection site (regional anesthetic).  Your surgeon will mark the area of your toe for removal.  Your surgeon will make an incision in your toe.  The dead tissue and bone will be removed.  Nerves and blood vessels will be tied or heated with a tool to stop bleeding.  The area will be drained and cleaned.  If only part of a toe is removed, the remaining part will be covered with a flap of skin.  The incision will be treated in one of these ways: ? It will be closed with stitches (sutures). ? It will be  left open to heal if there is an infection.  The incision area may be packed with gauze and covered with bandages (dressings).  Tissue samples may be sent to a lab to be examined under a microscope. The procedure may vary among health care providers and hospitals. What happens after the procedure?  Your blood pressure, heart rate, breathing rate, and blood oxygen level will be monitored until you leave the hospital or clinic.  Your foot will be raised up (elevated) to relieve swelling.  You will be monitored for pain.  You will be given pain medicines and antibiotics.  Your health care provider or physical therapist will help you move around as soon as possible.  Do not drive for 24 hours if you were given a sedative during your procedure. Summary  Toe amputation is a surgical procedure to remove all or part of a toe.  Before the procedure, follow instructions from your health care provider about taking medicines and about eating or drinking restrictions.  During the procedure, steps will be taken to reduce your risk of infection.  After the procedure, you will be given pain medicines and antibiotics.  After the procedure, your foot will be raised up (elevated) to relieve swelling. This information is not intended to replace advice given to you by your health care provider. Make sure you discuss any questions you have with your health care provider. Document Revised: 08/07/2018 Document Reviewed: 08/07/2018 Elsevier Patient Education  2021 [employer] Information on my medicine - ELIQUIS (apixaban)  Why was Eliquis prescribed for you? Eliquis was prescribed for you to reduce the risk of a blood clot forming that can cause a stroke if you have a medical condition called atrial fibrillation (a type of irregular heartbeat).  What do You need to know about Eliquis ? Take your Eliquis TWICE DAILY - one tablet in the morning and one tablet in the evening with or without food.  If you have difficulty swallowing the tablet whole please discuss with your pharmacist how to take the medication safely.  Take Eliquis exactly as prescribed by your doctor and DO NOT stop taking Eliquis without talking to the doctor who prescribed the medication.  Stopping may increase your risk of developing a stroke.  Refill your prescription before you run out.  After discharge, you should have regular check-up appointments with your healthcare provider that is prescribing your Eliquis.  In the future your dose may need to be changed if your kidney function or weight changes by a significant amount or as you get older.  What do you do if you miss a dose? If you miss a dose, take it as soon as you remember on the same day and resume taking twice daily.  Do not take more than one dose of ELIQUIS at the same time to make up a missed dose.  Important Safety Information A possible side effect of Eliquis is bleeding. You should call your healthcare provider right away if you experience any of  the following: ? Bleeding from an injury or your nose that does not stop. ? Unusual colored urine (red or dark brown) or unusual colored stools (red or black). ? Unusual bruising for unknown reasons. ? A serious fall or if you hit your head (even if there is no bleeding).  Some medicines may interact with Eliquis and might increase your risk of bleeding or clotting while on Eliquis. To help avoid this, consult your healthcare provider or pharmacist prior to using any new prescription or non-prescription medications, including herbals, vitamins, non-steroidal anti-inflammatory drugs (NSAIDs) and supplements.  This website has more information on Eliquis (apixaban): http://www.eliquis.com/eliquis/home

## 2020-08-26 ENCOUNTER — Other Ambulatory Visit (HOSPITAL_COMMUNITY): Payer: Medicare Other

## 2020-08-26 NOTE — Progress Notes (Signed)
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  Underwent Right common femoral, profundofemoral and SFA endarterectomy and Right common femoral to below-knee popliteal artery bypass 12/28.    PT Comments    Pt supine in bed on arrival this session.  Focused on LLE strengthening and educated on positioning RLE with knee and toes pointed during ceiling as her hamstring on R side is getting very tight.  Pt would benefit from kick stand prafo boot if she can tolerate the contact to her foot and lower leg.  Continue to recommend aggressive rehab in a post acute setting to transition to standing on her R foot.  Pt does not have LLE prosthetic on campus at this time.     Follow Up Recommendations  CIR     Equipment Recommendations  None recommended by PT    Recommendations for Other Services       Precautions / Restrictions Precautions Precautions: Fall Restrictions Weight Bearing Restrictions: No    Mobility  Bed Mobility Overal bed mobility: Needs Assistance Bed Mobility: Supine to Sit;Sit to Supine     Supine to sit: Min assist (Min assistance to move RLE to edge of bed.)     General bed mobility comments: Assistance to move RLE to edge of bed, able to come to long sitting unsupported.  Transfers Overall transfer level: Needs assistance Equipment used: None Transfers: Licensed conveyancer transfers: Min assist   General transfer comment: Min assistance to guide and support RLE during transition scooting posterior on her bottom.  Ambulation/Gait Ambulation/Gait assistance:  (NT- unable.)               Stairs             Wheelchair Mobility    Modified Rankin (Stroke Patients Only)       Balance Overall balance assessment: Needs assistance    Sitting balance-Leahy Scale: Fair                                      Cognition Arousal/Alertness: Awake/alert Behavior During Therapy: WFL for tasks assessed/performed Overall Cognitive Status: Within Functional Limits for tasks assessed                                        Exercises General Exercises - Lower Extremity Ankle Circles/Pumps: AROM;Right;20 reps;Supine Quad Sets: AROM;Right;10 reps;Supine Long Arc Quad: AROM;Right;10 reps;Supine Heel Slides: AROM;Right;10 reps;Supine Hip ABduction/ADduction: AROM;Right;10 reps;Supine Straight Leg Raises: AROM;Right;10 reps;Supine    General Comments        Pertinent Vitals/Pain Pain Assessment: 0-10 Pain Score: 7  Pain Location: R groin incision into lower Leg Pain Descriptors / Indicators: Stabbing;Sharp Pain Intervention(s): Monitored during session;Repositioned    Home Living                      Prior Function            PT Goals (current goals can now be found in the care plan section) Acute Rehab PT Goals Patient Stated Goal: Try to get home and start waking again. Potential to Achieve Goals: Good Additional Goals Additional Goal #1: Mercedes [name]  LE prosthetic to assist in transfer to wheelchair Progress towards PT goals: Progressing toward goals    Frequency    Min 3X/week      PT Plan Current plan remains appropriate    Co-evaluation              AM-PAC PT "6 Clicks" Mobility   Outcome Measure  Help needed turning from your back to your side while in a flat bed without using bedrails?: A Little Help needed moving from lying on your back to sitting on the side of a flat bed without using bedrails?: A Little Help needed moving to and from a bed to a chair (including a wheelchair)?: A Little Help needed standing up from a chair using your arms (e.g., wheelchair or bedside chair)?: Total Help needed to walk in hospital room?: Total Help needed climbing 3-5  steps with a railing? : Total 6 Click Score: 12    End of Session Equipment Utilized During Treatment: Gait belt Activity Tolerance: Patient limited by pain Patient left: in chair;with call bell/phone within reach;with chair alarm set Nurse Communication: Mobility status PT Visit Diagnosis: Unsteadiness on feet (R26.81);Other abnormalities of gait and mobility (R26.89);Muscle weakness (generalized) (M62.81);Difficulty in walking, not elsewhere classified (R26.2);Pain     Time: 0623-7628 PT Time Calculation (min) (ACUTE ONLY): 24 min  Charges:  $Therapeutic Exercise: 8-22 mins $Therapeutic Activity: 8-22 mins                     Bonney Leitz , PTA Acute Rehabilitation Services Pager [phone] Office [phone]     Najah Liverman Artis Delay 08/26/2020, 11:27 AM

## 2020-08-26 NOTE — Progress Notes (Signed)
Mobility Specialist - Progress Note   08/26/20 1331  Mobility  Activity Transferred:  Chair to bed  Level of Assistance Minimal assist, patient does 75% or more  Assistive Device Front wheel walker  Mobility Response Tolerated well  Mobility performed by Mobility specialist  $Mobility charge 1 Mobility   Pt able to tolerate stand and pivot from chair to bed by bearing weight on her R heel. HR in 70s throughout. Endorsed some ankle soreness.   Mamie Levers Mobility Specialist Mobility Specialist Phone: [phone]

## 2020-08-26 NOTE — Progress Notes (Addendum)
  Progress Note    08/26/2020 7:11 AM 2 Days Post-Op  Subjective:  Doing well with pain control.    Vitals:   08/26/20 0012 08/26/20 0349  BP: (!) 141/77 (!) 127/55  Pulse: 68 77  Resp: 18 19  Temp: 99.3 F (37.4 C) 99.5 F (37.5 C)  SpO2: 97% 95%    Physical Exam: Cardiac:  RRR Lungs:  CTAB Incisions:  Right groin and lower leg incisions well approximated. NO drainage or erythema. Extremities:  RLE motor and sensation intact. Right great toe tender to palpation. +AT, PT and peroneal Doppler signals.    Intake/Output Summary (Last 24 hours) at 08/26/2020 0711 Last data filed at 08/25/2020 1832 Gross per 24 hour  Intake 480 ml  Output 350 ml  Net 130 ml    HOSPITAL MEDICATIONS Scheduled Meds: . apixaban  5 mg Oral BID  . aspirin EC  81 mg Oral Daily  . Chlorhexidine Gluconate Cloth  6 each Topical Q0600  . docusate sodium  100 mg Oral Daily  . metoprolol tartrate  12.5 mg Oral BID  . mupirocin ointment  1 application Nasal BID  . pantoprazole  40 mg Oral Daily  . Ensure Max Protein  8 oz Oral Daily  . rosuvastatin  40 mg Oral QHS  . sodium chloride flush  3 mL Intravenous Q12H   Continuous Infusions: . sodium chloride    . sodium chloride Stopped (08/25/20 0009)  . magnesium sulfate bolus IVPB     PRN Meds:.sodium chloride, acetaminophen, alum & mag hydroxide-simeth, guaiFENesin-dextromethorphan, hydrALAZINE, HYDROmorphone (DILAUDID) injection, labetalol, magnesium sulfate bolus IVPB, nitroGLYCERIN, ondansetron (ZOFRAN) IV, oxyCODONE-acetaminophen, phenol, potassium chloride, sodium chloride flush  Assessment: POD 2 right fem to BK pop bypass with PTFE. Surgically stable. Acute blood loss anemia. Temp: 99.5 No new labs  Plan: -CIR consultation -apixaban restarted -check cbc in am -continue statin and aspirin -DVT prophylaxis:    Wendi Maya, PA-C [facility] [phone] 08/26/2020  7:11 AM   I have independently  interviewed and examined patient and agree with PA assessment and plan above.   Shandra Szymborski C. Randie Heinz, MD [facility] [place] Office: [phone] Pager: [phone]

## 2020-08-26 NOTE — Progress Notes (Signed)
The patient voided this AM.  Could not measure but it seemed to be about 500 cc.  Will continue to monitor.  Harriet Masson, RN

## 2020-08-27 ENCOUNTER — Inpatient Hospital Stay (HOSPITAL_COMMUNITY): Payer: Medicare Other

## 2020-08-27 DIAGNOSIS — I739 Peripheral vascular disease, unspecified: Secondary | ICD-10-CM

## 2020-08-27 DIAGNOSIS — Z9889 Other specified postprocedural states: Secondary | ICD-10-CM | POA: Diagnosis not present

## 2020-08-27 LAB — CBC WITH DIFFERENTIAL/PLATELET
Abs Immature Granulocytes: 0.03 10*3/uL (ref 0.00–0.07)
Basophils Absolute: 0 10*3/uL (ref 0.0–0.1)
Basophils Relative: 0 %
Eosinophils Absolute: 0.4 10*3/uL (ref 0.0–0.5)
Eosinophils Relative: 7 %
HCT: 32 % — ABNORMAL LOW (ref 36.0–46.0)
Hemoglobin: 9.7 g/dL — ABNORMAL LOW (ref 12.0–15.0)
Immature Granulocytes: 1 %
Lymphocytes Relative: 26 %
Lymphs Abs: 1.7 10*3/uL (ref 0.7–4.0)
MCH: 24.3 pg — ABNORMAL LOW (ref 26.0–34.0)
MCHC: 30.3 g/dL (ref 30.0–36.0)
MCV: 80.2 fL (ref 80.0–100.0)
Monocytes Absolute: 0.4 10*3/uL (ref 0.1–1.0)
Monocytes Relative: 7 %
Neutro Abs: 3.8 10*3/uL (ref 1.7–7.7)
Neutrophils Relative %: 59 %
Platelets: 298 10*3/uL (ref 150–400)
RBC: 3.99 MIL/uL (ref 3.87–5.11)
RDW: 16.3 % — ABNORMAL HIGH (ref 11.5–15.5)
WBC: 6.3 10*3/uL (ref 4.0–10.5)
nRBC: 0 % (ref 0.0–0.2)

## 2020-08-27 MED ORDER — FLEET ENEMA 7-19 GM/118ML RE ENEM
1.0000 | ENEMA | Freq: Every day | RECTAL | Status: DC | PRN
Start: 2020-08-27 — End: 2020-09-08
  Administered 2020-08-29: 10:00:00 1 via RECTAL
  Filled 2020-08-27: qty 1

## 2020-08-27 NOTE — CV Procedure (Signed)
ABI completed.  Results can be found under chart review under CV PROC. 08/27/2020 10:15 AM Kama Cammarano RVT, RDMS

## 2020-08-27 NOTE — Care Management Important Message (Signed)
Important Message  Patient Details  Name: Mercedes [name] MRN: [id number] Date of Birth: 21-Jun-1956   Medicare Important Message Given:  Yes     Jerlene Rockers Stefan Church 08/27/2020, 2:52 PM

## 2020-08-27 NOTE — Progress Notes (Signed)
Patient requesting Laxative to help with bowel movement. Per patient no bm since 08/23/20. MD on call made aware and orders received.  Will monitor patient. Cru Kritikos, Randall An RN

## 2020-08-27 NOTE — Progress Notes (Signed)
Mobility Specialist: Progress Note   08/27/20 1139  Mobility  Activity Transferred:  Bed to chair  Level of Assistance Minimal assist, patient does 75% or more  Assistive Device Front wheel walker  Mobility Response Tolerated well  Mobility performed by Mobility specialist  Bed Position Chair  $Mobility charge 1 Mobility   Pre-Mobility: 58 HR, 119/68 BP, 100% SpO2 Post-Mobility: 71 HR, 122/77 BP, 100% SpO2  Pt independent to EOB and minA to stand. Pt was able to stand and pivot to chair, c/o 6-7/10 pain during transfer. Encouraged pt to sit in chair through lunch.   [facility] Alicia Seib Mobility Specialist

## 2020-08-27 NOTE — Plan of Care (Signed)

## 2020-08-27 NOTE — Progress Notes (Signed)
Inpatient Rehab Admissions Coordinator:   Inpatient Rehab consult received and chart reviewed.  Note that there is a possibility for further surgery in the next few days.  Since [facility] medicare is closed through the weekend, and CIR will not have a bed before mid week next week, will follow over the weekend for potential surgical plans and see pt on Monday.   Estill Dooms, PT, DPT Admissions Coordinator [phone] 08/27/20  3:45 PM

## 2020-08-27 NOTE — Progress Notes (Addendum)
  Progress Note    08/27/2020 9:21 AM 3 Days Post-Op  Subjective:  Doing well. Right great toe remains tender. Just returned from getting post-op ABIs   Vitals:   08/27/20 0454 08/27/20 0743  BP: 112/68 129/66  Pulse: 66 63  Resp: 17 13  Temp: 98.5 F (36.9 C)   SpO2: 97% 100%    Physical Exam: Cardiac:  RRR Lungs:  CTAB Incisions:  Right groin and lower leg incisions well approximated. No drainage or erythema. Extremities:  RLE motor and sensation intact. Right great toe tender to palpation. +AT, PT and peroneal Doppler signals. CBC    Component Value Date/Time   WBC 6.3 08/27/2020 0056   RBC 3.99 08/27/2020 0056   HGB 9.7 (L) 08/27/2020 0056   HGB 14.2 10/09/2018 1125   HCT 32.0 (L) 08/27/2020 0056   HCT 44.6 10/09/2018 1125   PLT 298 08/27/2020 0056   PLT 289 10/09/2018 1125   MCV 80.2 08/27/2020 0056   MCV 79 10/09/2018 1125   MCH 24.3 (L) 08/27/2020 0056   MCHC 30.3 08/27/2020 0056   RDW 16.3 (H) 08/27/2020 0056   RDW 14.8 10/09/2018 1125   LYMPHSABS 1.7 08/27/2020 0056   MONOABS 0.4 08/27/2020 0056   EOSABS 0.4 08/27/2020 0056   BASOSABS 0.0 08/27/2020 0056    BMET    Component Value Date/Time   NA 133 (L) 08/25/2020 0300   NA 135 10/09/2018 1125   K 4.2 08/25/2020 0300   CL 102 08/25/2020 0300   CO2 22 08/25/2020 0300   GLUCOSE 98 08/25/2020 0300   BUN 12 08/25/2020 0300   BUN 10 10/09/2018 1125   CREATININE 0.75 08/25/2020 0300   CREATININE 1.13 (H) 08/03/2016 1440   CALCIUM 8.9 08/25/2020 0300   GFRNONAA >60 08/25/2020 0300   GFRAA >60 11/24/2019 0620     Intake/Output Summary (Last 24 hours) at 08/27/2020 2202 Last data filed at 08/27/2020 0300 Gross per 24 hour  Intake 480 ml  Output 1400 ml  Net -920 ml    HOSPITAL MEDICATIONS Scheduled Meds: . apixaban  5 mg Oral BID  . aspirin EC  81 mg Oral Daily  . docusate sodium  100 mg Oral Daily  . metoprolol tartrate  12.5 mg Oral BID  . pantoprazole  40 mg Oral Daily  . Ensure  Max Protein  8 oz Oral Daily  . rosuvastatin  40 mg Oral QHS  . sodium chloride flush  3 mL Intravenous Q12H   Continuous Infusions: . sodium chloride    . sodium chloride Stopped (08/25/20 0009)  . magnesium sulfate bolus IVPB     PRN Meds:.sodium chloride, acetaminophen, alum & mag hydroxide-simeth, guaiFENesin-dextromethorphan, hydrALAZINE, HYDROmorphone (DILAUDID) injection, labetalol, magnesium sulfate bolus IVPB, nitroGLYCERIN, ondansetron (ZOFRAN) IV, oxyCODONE-acetaminophen, phenol, potassium chloride, sodium chloride flush  Assessment:   POD 3 right fem to BK pop bypass with PTFE. VSS. RLE well perfused. Right great toe with ischemic changes and moderate pain. May ultimately need amputation. Acute blood loss anemia. 12.2>11.5>10.1>9.7 today. Asymptomatic Afebrile  Plan: -CIR consultation in place; awaiting acceptance -apixaban restarted -continue statin and aspirin    Wendi Maya, PA-C [facility] [phone] 08/27/2020  9:21 AM   Agree with above.  Incisions clean good doppler warm foot. May need amputation of left first toe for gangrene and pain will let demarcate over the next few days.  Fabienne Bruns, MD [facility] [place] Office: [phone]

## 2020-08-28 NOTE — Progress Notes (Signed)
Mobility Specialist: Progress Note   08/28/20 1242  Mobility  Activity Ambulated in hall  Level of Assistance Contact guard assist, steadying assist  Assistive Device Front wheel walker  Distance Ambulated (ft) 80 ft  Mobility Response Tolerated well  Mobility performed by Mobility specialist  $Mobility charge 1 Mobility   Pre-Mobility: 67 HR, 136/76 BP, 95% SpO2 Post-Mobility: 76 HR, 128/67 BP, 98% SpO2  Pt ambulated w/o prosthetic. Pt visibly fatigued by end of ambulation and requesting to go back to the bed to nap. Pt expressed she still has some pain in L stump and R toe. Encouraged pt to ambulate with staff at least one more time today.   [facility] Mercedes [name] Mobility Specialist

## 2020-08-28 NOTE — Progress Notes (Addendum)
Progress Note    08/28/2020 9:11 AM 4 Days Post-Op  Subjective: Says she ambulated short distance. Right great toe pain improved. She is anxious to get home to take care of personal business and proceed with WC ramp. States there is no family or friend support to help her with this and that she is capable of handling all of this at home with landline phone and laptop.   Vitals:   08/28/20 0328 08/28/20 0805  BP: 117/68 107/68  Pulse: 72 69  Resp: 20 11  Temp: 98.6 F (37 C) 98.6 F (37 C)  SpO2: 98% 99%    Physical Exam: Cardiac:RRR Lungs:CTAB Incisions:Right groin and lower leg incisions well approximated. No drainage or erythema. Extremities:RLE motor and sensation intact. No changes in appearance of toe. Foot is warm. CBC    Component Value Date/Time   WBC 6.3 08/27/2020 0056   RBC 3.99 08/27/2020 0056   HGB 9.7 (L) 08/27/2020 0056   HGB 14.2 10/09/2018 1125   HCT 32.0 (L) 08/27/2020 0056   HCT 44.6 10/09/2018 1125   PLT 298 08/27/2020 0056   PLT 289 10/09/2018 1125   MCV 80.2 08/27/2020 0056   MCV 79 10/09/2018 1125   MCH 24.3 (L) 08/27/2020 0056   MCHC 30.3 08/27/2020 0056   RDW 16.3 (H) 08/27/2020 0056   RDW 14.8 10/09/2018 1125   LYMPHSABS 1.7 08/27/2020 0056   MONOABS 0.4 08/27/2020 0056   EOSABS 0.4 08/27/2020 0056   BASOSABS 0.0 08/27/2020 0056    BMET    Component Value Date/Time   NA 133 (L) 08/25/2020 0300   NA 135 10/09/2018 1125   K 4.2 08/25/2020 0300   CL 102 08/25/2020 0300   CO2 22 08/25/2020 0300   GLUCOSE 98 08/25/2020 0300   BUN 12 08/25/2020 0300   BUN 10 10/09/2018 1125   CREATININE 0.75 08/25/2020 0300   CREATININE 1.13 (H) 08/03/2016 1440   CALCIUM 8.9 08/25/2020 0300   GFRNONAA >60 08/25/2020 0300   GFRAA >60 11/24/2019 0620     Intake/Output Summary (Last 24 hours) at 08/28/2020 0911 Last data filed at 08/28/2020 0500 Gross per 24 hour  Intake 460 ml  Output 400 ml  Net 60 ml    HOSPITAL  MEDICATIONS Scheduled Meds: . apixaban  5 mg Oral BID  . aspirin EC  81 mg Oral Daily  . docusate sodium  100 mg Oral Daily  . metoprolol tartrate  12.5 mg Oral BID  . pantoprazole  40 mg Oral Daily  . Ensure Max Protein  8 oz Oral Daily  . rosuvastatin  40 mg Oral QHS  . sodium chloride flush  3 mL Intravenous Q12H   Continuous Infusions: . sodium chloride    . sodium chloride Stopped (08/25/20 0009)  . magnesium sulfate bolus IVPB     PRN Meds:.sodium chloride, acetaminophen, alum & mag hydroxide-simeth, guaiFENesin-dextromethorphan, hydrALAZINE, HYDROmorphone (DILAUDID) injection, labetalol, magnesium sulfate bolus IVPB, nitroGLYCERIN, ondansetron (ZOFRAN) IV, oxyCODONE-acetaminophen, phenol, potassium chloride, sodium chloride flush, sodium phosphate  ABI/TBIToday's ABIToday's TBIPrevious ABIPrevious TBI  +-------+-----------+-----------+------------+------------+  Right 0.70          0.30            +-------+-----------+-----------+------------+------------+  Left  AKA                        +-------+-----------+-----------+------------+------------+  Assessment: POD 4 right fem to BK pop bypass with PTFE. VSS. RLE well perfused. Right great toe with ischemic changes and  moderate pain. May ultimately need amputation. Post-op right ABI 0.70 Remains afebrile. Explained that with desire for CIR, these plans were put in place and because [facility] Medicare closed over holiday/weekend, this has delayed disposition. She verbalizes understanding that d/c decisions are made on ability to care for oneself in current home setting.  Acute blood loss anemia. 12.2>11.5>10.1>9.7. Asymptomatic. No new labs.  Plan: -CIR consultation in place; awaiting acceptance and monitoring decision regarding toe amp -apixaban restarted -continue statin and aspirin  Wendi Maya, PA-C [facility] [phone] 08/28/2020  9:11 AM    ABI 0.7 improved from 0.2. Foot warm incisions clean.  Toe demarcating at this point.  I think we should amputate it prior to going to rehab.  Will schedule for OR on Tuesday.  Need to stop Apixaban tomorrow.  She is on this for paroxsymal afib  Fabienne Bruns, MD [facility] [place] Office: [phone]

## 2020-08-28 NOTE — Progress Notes (Signed)
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  Underwent Right common femoral, profundofemoral and SFA endarterectomy and Right common femoral to below-knee popliteal artery bypass 12/28.    PT Comments    Pt very eager to progress mobility and go home. Pt able to tolerate 60' of ambulation hoping on R LE. Pt unable to don L LE prosthesis due to swelling and pain. Pt with noted beginning of L hip flexion contracture, worked on L hip extension stretching, pt appreciative. Pt given bilat LE HEP. Acute PT to cont to follow.    Follow Up Recommendations  CIR     Equipment Recommendations  Rolling walker with 5" wheels    Recommendations for Other Services Rehab consult     Precautions / Restrictions Precautions Precautions: Fall Precaution Comments: use prosthetic shrinker to help with L LE swelling Restrictions Weight Bearing Restrictions: No    Mobility  Bed Mobility Overal bed mobility: Needs Assistance Bed Mobility: Supine to Sit     Supine to sit: Supervision     General bed mobility comments: HOB elevated, used bed rail, able to manage R LE without assist  Transfers Overall transfer level: Needs assistance Equipment used: Rolling walker (2 wheeled) Transfers: Sit to/from Stand Sit to Stand: Min assist         General transfer comment: unable to don L LE prosthesis due to swelling and pain, pt able to tolerate R LE WBing and stand with RW  Ambulation/Gait Ambulation/Gait assistance: Min guard Gait Distance (Feet): 50 Feet Assistive device: Rolling walker (2 wheeled) Gait Pattern/deviations: Step-to pattern Gait velocity: slow   General Gait Details: unable to don prosthesis due to swelling and pain, pt able to hop successfully clearing R LE   Stairs             Wheelchair  Mobility    Modified Rankin (Stroke Patients Only)       Balance Overall balance assessment: Needs assistance Sitting-balance support: Feet supported;No upper extremity supported Sitting balance-Leahy Scale: Good Sitting balance - Comments: able to attempt to don prosthesis without LOB   Standing balance support: Bilateral upper extremity supported Standing balance-Leahy Scale: Poor Standing balance comment: dependent on RW                            Cognition Arousal/Alertness: Awake/alert Behavior During Therapy: WFL for tasks assessed/performed Overall Cognitive Status: Within Functional Limits for tasks assessed                                 General Comments: pt very eager to mobilize and go home      Exercises General Exercises - Lower Extremity Ankle Circles/Pumps: AROM;Right;Supine Quad Sets: AROM;Right;10 reps;Supine Long Arc Quad: AROM;Right;10 reps;Seated Amputee Exercises Hip Extension: PROM;5 reps;Standing (passive stretch into extension due to beginning of L hip flexion contracture)    General Comments General comments (skin integrity, edema, etc.): VSS, pt with noted swelling in R LE, L residual limb, at pelvis and vagina      Pertinent Vitals/Pain Pain Assessment: 0-10 Pain Score: 6  Pain Location: R groin incision, hypersensitivty in L residual limb Pain Descriptors / Indicators: Burning;Sharp Pain Intervention(s): Monitored during session    Home Living  Prior Function            PT Goals (current goals can now be found in the care plan section) Progress towards PT goals: Progressing toward goals    Frequency    Min 3X/week      PT Plan Current plan remains appropriate    Co-evaluation              AM-PAC PT "6 Clicks" Mobility   Outcome Measure  Help needed turning from your back to your side while in a flat bed without using bedrails?: A Little Help needed moving  from lying on your back to sitting on the side of a flat bed without using bedrails?: A Little Help needed moving to and from a bed to a chair (including a wheelchair)?: A Little Help needed standing up from a chair using your arms (e.g., wheelchair or bedside chair)?: A Lot Help needed to walk in hospital room?: A Lot Help needed climbing 3-5 steps with a railing? : A Lot 6 Click Score: 15    End of Session Equipment Utilized During Treatment: Gait belt Activity Tolerance: Patient tolerated treatment well Patient left: in chair;with call bell/phone within reach;with chair alarm set Nurse Communication: Mobility status PT Visit Diagnosis: Unsteadiness on feet (R26.81);Other abnormalities of gait and mobility (R26.89);Muscle weakness (generalized) (M62.81);Difficulty in walking, not elsewhere classified (R26.2);Pain     Time: 0825-0857 PT Time Calculation (min) (ACUTE ONLY): 32 min  Charges:  $Gait Training: 8-22 mins $Therapeutic Exercise: 8-22 mins                     Lewis Shock, PT, DPT Acute Rehabilitation Services Pager #: [phone] Office #: [phone]    [name] 08/28/2020, 3:17 PM

## 2020-08-29 LAB — GLUCOSE, CAPILLARY: Glucose-Capillary: 161 mg/dL — ABNORMAL HIGH (ref 70–99)

## 2020-08-29 NOTE — Progress Notes (Addendum)
  Progress Note    08/29/2020 7:32 AM 5 Days Post-Op  Subjective:  "leg hurts" Sitting up in bed. No apparent distress   Vitals:   08/28/20 2332 08/29/20 0352  BP: (!) 100/57 108/66  Pulse: 65 71  Resp: 15 17  Temp: 98.6 F (37 C) 98.8 F (37.1 C)  SpO2: 97% 98%    Physical Exam: Cardiac:  RRR Lungs:  nonlabored Incisions:  Right groin and lower leg incisions continue to heal without signs of infection Extremities:  Right great toe with marginally worse ischemic changes particularly along medial nailbed. Brisk PT, AT and peroneal Doppler signals   CBC    Component Value Date/Time   WBC 6.3 08/27/2020 0056   RBC 3.99 08/27/2020 0056   HGB 9.7 (L) 08/27/2020 0056   HGB 14.2 10/09/2018 1125   HCT 32.0 (L) 08/27/2020 0056   HCT 44.6 10/09/2018 1125   PLT 298 08/27/2020 0056   PLT 289 10/09/2018 1125   MCV 80.2 08/27/2020 0056   MCV 79 10/09/2018 1125   MCH 24.3 (L) 08/27/2020 0056   MCHC 30.3 08/27/2020 0056   RDW 16.3 (H) 08/27/2020 0056   RDW 14.8 10/09/2018 1125   LYMPHSABS 1.7 08/27/2020 0056   MONOABS 0.4 08/27/2020 0056   EOSABS 0.4 08/27/2020 0056   BASOSABS 0.0 08/27/2020 0056    BMET    Component Value Date/Time   NA 133 (L) 08/25/2020 0300   NA 135 10/09/2018 1125   K 4.2 08/25/2020 0300   CL 102 08/25/2020 0300   CO2 22 08/25/2020 0300   GLUCOSE 98 08/25/2020 0300   BUN 12 08/25/2020 0300   BUN 10 10/09/2018 1125   CREATININE 0.75 08/25/2020 0300   CREATININE 1.13 (H) 08/03/2016 1440   CALCIUM 8.9 08/25/2020 0300   GFRNONAA >60 08/25/2020 0300   GFRAA >60 11/24/2019 0620     Intake/Output Summary (Last 24 hours) at 08/29/2020 0732 Last data filed at 08/28/2020 2100 Gross per 24 hour  Intake 360 ml  Output -  Net 360 ml    HOSPITAL MEDICATIONS Scheduled Meds: . aspirin EC  81 mg Oral Daily  . docusate sodium  100 mg Oral Daily  . metoprolol tartrate  12.5 mg Oral BID  . pantoprazole  40 mg Oral Daily  . Ensure Max Protein  8 oz  Oral Daily  . rosuvastatin  40 mg Oral QHS  . sodium chloride flush  3 mL Intravenous Q12H   Continuous Infusions: . sodium chloride    . sodium chloride Stopped (08/25/20 0009)  . magnesium sulfate bolus IVPB     PRN Meds:.sodium chloride, acetaminophen, alum & mag hydroxide-simeth, guaiFENesin-dextromethorphan, hydrALAZINE, HYDROmorphone (DILAUDID) injection, labetalol, magnesium sulfate bolus IVPB, nitroGLYCERIN, ondansetron (ZOFRAN) IV, oxyCODONE-acetaminophen, phenol, potassium chloride, sodium chloride flush, sodium phosphate  Assessment:    POD5right fem to BK pop bypass with PTFE. VSS. Afebrile. RLE well perfused. Right great toe with ischemic changes and moderate pain. Right great toe amputation planned for 1/4  Post-op right ABI 0.70  Acute blood loss anemia.12.2>11.5>10.1>9.7. Asymptomatic. No new labs.  Plan: - Right great toe amputation planned for 1/4 -continue aspirin and Plavix   Wendi Maya, PA-C [facility] [phone] 08/29/2020  7:32 AM   Toe is more demarcated to the distal end today Patent bypass Amp first toe on Tuesday Hold apixaban  Fabienne Bruns, MD [facility] [place] Office: [phone]

## 2020-08-29 NOTE — Progress Notes (Signed)
Mobility Specialist: Progress Note   08/29/20 1516  Mobility  Activity Transferred:  Bed to chair  Level of Assistance Minimal assist, patient does 75% or more  Assistive Device Front wheel walker  Mobility Response Tolerated well  Mobility performed by Mobility specialist  Bed Position Chair  $Mobility charge 1 Mobility   Pre-Mobility: 80 HR Post-Mobility: 86 HR  Pt said she didn't feel like she could ambulate but was willing to transfer to the chair. Pt sitting up in chair with phone and call bell in her lap.   [facility] Kiely Cousar Mobility Specialist

## 2020-08-30 ENCOUNTER — Other Ambulatory Visit: Payer: Self-pay

## 2020-08-30 ENCOUNTER — Inpatient Hospital Stay (HOSPITAL_COMMUNITY): Payer: Medicare Other

## 2020-08-30 MED ORDER — EZETIMIBE 10 MG PO TABS
10.0000 mg | ORAL_TABLET | Freq: Every day | ORAL | Status: DC
  Administered 2020-08-31 – 2020-09-08 (×9): 10 mg via ORAL
  Filled 2020-08-30 (×9): qty 1

## 2020-08-30 NOTE — Progress Notes (Addendum)
  Progress Note    08/30/2020 7:43 AM 6 Days Post-Op  Subjective: complaining of L hip pain, R GT pain   Vitals:   08/29/20 2335 08/30/20 0433  BP: 121/65 120/65  Pulse: 62   Resp: 18 18  Temp: 99 F (37.2 C) 98.6 F (37 C)  SpO2: 100% 100%   Physical Exam: Lungs:  Non labored Incisions:  R groin and popliteal incision c/d/i Extremities:  L hip pain to palpation; palpable R PT pulse Neurologic: A&O  CBC    Component Value Date/Time   WBC 6.3 08/27/2020 0056   RBC 3.99 08/27/2020 0056   HGB 9.7 (L) 08/27/2020 0056   HGB 14.2 10/09/2018 1125   HCT 32.0 (L) 08/27/2020 0056   HCT 44.6 10/09/2018 1125   PLT 298 08/27/2020 0056   PLT 289 10/09/2018 1125   MCV 80.2 08/27/2020 0056   MCV 79 10/09/2018 1125   MCH 24.3 (L) 08/27/2020 0056   MCHC 30.3 08/27/2020 0056   RDW 16.3 (H) 08/27/2020 0056   RDW 14.8 10/09/2018 1125   LYMPHSABS 1.7 08/27/2020 0056   MONOABS 0.4 08/27/2020 0056   EOSABS 0.4 08/27/2020 0056   BASOSABS 0.0 08/27/2020 0056    BMET    Component Value Date/Time   NA 133 (L) 08/25/2020 0300   NA 135 10/09/2018 1125   K 4.2 08/25/2020 0300   CL 102 08/25/2020 0300   CO2 22 08/25/2020 0300   GLUCOSE 98 08/25/2020 0300   BUN 12 08/25/2020 0300   BUN 10 10/09/2018 1125   CREATININE 0.75 08/25/2020 0300   CREATININE 1.13 (H) 08/03/2016 1440   CALCIUM 8.9 08/25/2020 0300   GFRNONAA >60 08/25/2020 0300   GFRAA >60 11/24/2019 0620    INR    Component Value Date/Time   INR 1.0 11/18/2019 1844     Intake/Output Summary (Last 24 hours) at 08/30/2020 0743 Last data filed at 08/29/2020 1919 Gross per 24 hour  Intake 560 ml  Output --  Net 560 ml     Assessment/Plan:  65 y.o. female is s/p R femoral to popliteal bypass with PTFE 6 Days Post-Op   R foot well perfused; palpable PT pulse L hip pain; check XR of hip Holding apixaban for planned R GT amp tomorrow 1/4 NPO  Consent   Emilie Rutter, PA-C [facility] [phone] 08/30/2020 7:43 AM  Agree with above.  Toe amputation tomorrow  Fabienne Bruns, MD [facility] [place] Office: [phone]

## 2020-08-30 NOTE — H&P (View-Only) (Signed)
  Progress Note    08/30/2020 7:43 AM 6 Days Post-Op  Subjective: complaining of L hip pain, R GT pain   Vitals:   08/29/20 2335 08/30/20 0433  BP: 121/65 120/65  Pulse: 62   Resp: 18 18  Temp: 99 F (37.2 C) 98.6 F (37 C)  SpO2: 100% 100%   Physical Exam: Lungs:  Non labored Incisions:  R groin and popliteal incision c/d/i Extremities:  L hip pain to palpation; palpable R PT pulse Neurologic: A&O  CBC    Component Value Date/Time   WBC 6.3 08/27/2020 0056   RBC 3.99 08/27/2020 0056   HGB 9.7 (L) 08/27/2020 0056   HGB 14.2 10/09/2018 1125   HCT 32.0 (L) 08/27/2020 0056   HCT 44.6 10/09/2018 1125   PLT 298 08/27/2020 0056   PLT 289 10/09/2018 1125   MCV 80.2 08/27/2020 0056   MCV 79 10/09/2018 1125   MCH 24.3 (L) 08/27/2020 0056   MCHC 30.3 08/27/2020 0056   RDW 16.3 (H) 08/27/2020 0056   RDW 14.8 10/09/2018 1125   LYMPHSABS 1.7 08/27/2020 0056   MONOABS 0.4 08/27/2020 0056   EOSABS 0.4 08/27/2020 0056   BASOSABS 0.0 08/27/2020 0056    BMET    Component Value Date/Time   NA 133 (L) 08/25/2020 0300   NA 135 10/09/2018 1125   K 4.2 08/25/2020 0300   CL 102 08/25/2020 0300   CO2 22 08/25/2020 0300   GLUCOSE 98 08/25/2020 0300   BUN 12 08/25/2020 0300   BUN 10 10/09/2018 1125   CREATININE 0.75 08/25/2020 0300   CREATININE 1.13 (H) 08/03/2016 1440   CALCIUM 8.9 08/25/2020 0300   GFRNONAA >60 08/25/2020 0300   GFRAA >60 11/24/2019 0620    INR    Component Value Date/Time   INR 1.0 11/18/2019 1844     Intake/Output Summary (Last 24 hours) at 08/30/2020 0743 Last data filed at 08/29/2020 1919 Gross per 24 hour  Intake 560 ml  Output --  Net 560 ml     Assessment/Plan:  64 y.o. female is s/p R femoral to popliteal bypass with PTFE 6 Days Post-Op   R foot well perfused; palpable PT pulse L hip pain; check XR of hip Holding apixaban for planned R GT amp tomorrow 1/4 NPO  Consent   Matthew Eveland, PA-C [facility] [phone] 08/30/2020 7:43 AM  Agree with above.  Toe amputation tomorrow  Ijeoma Loor, MD [facility] [place] Office: [phone]   

## 2020-08-30 NOTE — Patient Outreach (Signed)
Aging Gracefully Program  08/30/2020  Mercedes [name] 1955/10/02 [id number]   Patient called and states she is still in the hospital and will be having any surgery this week. She is concerned that [employer] will need access to her home. Patient gives permission to ramp to be built in her absence.    Email sent to [employer] to inform. Cell phone number provided.  Rowe Pavy, RN, BSN, CEN [facility] [employer] [phone]

## 2020-08-30 NOTE — Progress Notes (Signed)
PHARMACIST LIPID MONITORING   Mercedes [name] is a 65 y.o. female admitted on 08/23/2020 with PVD.  Pharmacy has been consulted to optimize lipid-lowering therapy with the indication of secondary prevention for clinical ASCVD.  Recent Labs:  Lipid Panel (last 6 months):   Lab Results  Component Value Date   CHOL 139 08/25/2020   TRIG 71 08/25/2020   HDL 48 08/25/2020   CHOLHDL 2.9 08/25/2020   VLDL 14 08/25/2020   LDLCALC 77 08/25/2020    Hepatic function panel (last 6 months):   No results found for: AST, ALT, ALKPHOS, BILITOT, BILIDIR, IBILI  SCr (since admission):   Serum creatinine: 0.75 mg/dL 08/29/70 5366 Estimated creatinine clearance: 44.1 mL/min  Current lipid-lowering therapy: crestor 40mg /d Documented or reported allergies or intolerances to lipid-lowering therapies (if applicable): none  Assessment:  Patient agrees with changes to lipid-lowering therapy  Recommendation per protocol:  Add ezetimibe.  Follow-up with:  Primary care provider - , MD  Follow-up labs after discharge:    Liver function panel and lipid panel in 8-12 weeks then annually  Plan: -add ezetimibe due to LDL= 77 on crestor 40mg /d  10-12, PharmD Clinical Pharmacist **Pharmacist phone directory can now be found on [email or web address] ([email or web address]).  Listed under [facility].

## 2020-08-30 NOTE — Progress Notes (Signed)
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  Underwent Right common femoral, profundofemoral and SFA endarterectomy and Right common femoral to below-knee popliteal artery bypass 12/28.   OT comments  Pt progressing towards established OT goals and is motivated to participate in therapy. Providing education on LB dressing techniques and pt donning underwear with Min A and RW. Pt performing functional mobility with Min Guard-Min A and RW. Continues to present with decreased balance, strength, and activity tolerance impacting her safe performance of ADLs. Conitnue to recommend dc to CIR and will continue to follow acutely as admitted.    Follow Up Recommendations  CIR    Equipment Recommendations  None recommended by OT    Recommendations for Other Services      Precautions / Restrictions Precautions Precautions: Fall Precaution Comments: use prosthetic shrinker to help with L LE swelling       Mobility Bed Mobility Overal bed mobility: Needs Assistance Bed Mobility: Sit to Supine       Sit to supine: Supervision   General bed mobility comments: Supervision for safety  Transfers Overall transfer level: Needs assistance Equipment used: Rolling walker (2 wheeled) Transfers: Sit to/from Stand Sit to Stand: Min guard         General transfer comment: Min Guard A for safety    Balance Overall balance assessment: Needs assistance Sitting-balance support: Feet supported;No upper extremity supported Sitting balance-Leahy Scale: Good Sitting balance - Comments: Able to perform forward flexion to don underwear   Standing balance support: Bilateral upper extremity supported;Single extremity supported;During functional activity Standing balance-Leahy Scale: Poor Standing balance comment: Reliant on  atleast one UE for support. Able to pull up pants in stanidng with MIn Guard-Min A for safety                           ADL either performed or assessed with clinical judgement   ADL Overall ADL's : Needs assistance/impaired                     Lower Body Dressing: Min guard Lower Body Dressing Details (indicate cue type and reason): Pt donning underwear. Cues for donning right LE first. Pt requiring increased time due to soreness and discomfort with forward flexion. Pt requiirng Min A for balance in standing and to manage gowns. Toilet Transfer: Min guard;Ambulation;RW (simulated at recliner)           Functional mobility during ADLs: Minimal assistance;Min guard;Rolling walker General ADL Comments: Pt presenting with decreased balance and activity tolerance. Despite pain, pt agreeable to therapy     Vision       Perception     Praxis      Cognition Arousal/Alertness: Awake/alert Behavior During Therapy: WFL for tasks assessed/performed Overall Cognitive Status: Within Functional Limits for tasks assessed                                          Exercises     Shoulder Instructions       General Comments VSS    Pertinent Vitals/ Pain       Pain Assessment: Faces Faces Pain Scale: Hurts little more Pain Location: L hip Pain Descriptors / Indicators: Burning;Sore Pain  Intervention(s): Limited activity within patient's tolerance;Repositioned;Monitored during session  Home Living                                          Prior Functioning/Environment              Frequency  Min 2X/week        Progress Toward Goals  OT Goals(current goals can now be found in the care plan section)  Progress towards OT goals: Progressing toward goals  Acute Rehab OT Goals Patient Stated Goal: Try to get home and start waking again. OT Goal Formulation: With patient Time For Goal Achievement: 09/08/20 Potential to  Achieve Goals: Good ADL Goals Pt Will Perform Grooming: with set-up;sitting Pt Will Perform Lower Body Bathing: with supervision;sitting/lateral leans Pt Will Perform Lower Body Dressing: with supervision;sitting/lateral leans Pt Will Transfer to Toilet: with min assist;bedside commode;stand pivot transfer Pt Will Perform Toileting - Clothing Manipulation and hygiene: with min guard assist;sitting/lateral leans  Plan Discharge plan remains appropriate    Co-evaluation                 AM-PAC OT "6 Clicks" Daily Activity     Outcome Measure   Help from another person eating meals?: None Help from another person taking care of personal grooming?: None Help from another person toileting, which includes using toliet, bedpan, or urinal?: A Lot Help from another person bathing (including washing, rinsing, drying)?: A Lot Help from another person to put on and taking off regular upper body clothing?: A Little Help from another person to put on and taking off regular lower body clothing?: A Lot 6 Click Score: 17    End of Session    OT Visit Diagnosis: Pain Pain - Right/Left: Right Pain - part of body: Leg   Activity Tolerance Patient tolerated treatment well   Patient Left with call bell/phone within reach;in bed   Nurse Communication Mobility status        Time: 1425-1440 OT Time Calculation (min): 15 min  Charges: OT General Charges $OT Visit: 1 Visit OT Treatments $Self Care/Home Management : 8-22 mins  Mercedes [name] MSOT, OTR/L Acute Rehab Pager: [phone] Office: [phone]   Theodoro Grist Serra Younan 08/30/2020, 2:51 PM

## 2020-08-30 NOTE — Progress Notes (Signed)
Went in assess patient, she refused, I explained to pt the need to assess incisions and pulse, she continued to refuse and said "Dr. Delice Bison them today."

## 2020-08-30 NOTE — Progress Notes (Signed)
Mobility Specialist: Progress Note   08/30/20 1208  Mobility  Activity Ambulated in hall  Level of Assistance Minimal assist, patient does 75% or more  Assistive Device Front wheel walker  Distance Ambulated (ft) 100 ft  Mobility Response Tolerated well  Mobility performed by Mobility specialist  Bed Position Chair  $Mobility charge 1 Mobility   Pre-Mobility: 57 HR, 129/82 BP, 100% SpO2 Post-Mobility: 60 HR, 135/71 BP, 100% SpO2  Pt said she felt much better this session. Pt c/o tightness in RLE gastroc but said it was better than her previous session. Pt back to chair with call bell.   [facility] Mercedes [name] Mobility Specialist

## 2020-08-30 NOTE — Progress Notes (Signed)
Inpatient Rehab Admissions Coordinator:   Note plans for surgery tomorrow.  Will plan for f/u pending post-op therapy recommendations.    Estill Dooms, PT, DPT Admissions Coordinator [phone] 08/30/20  11:37 AM

## 2020-08-30 NOTE — Anesthesia Preprocedure Evaluation (Addendum)
Anesthesia Evaluation  Patient identified by MRN, date of birth, ID band Patient awake    Reviewed: Allergy & Precautions, NPO status , Patient's Chart, lab work & pertinent test results  Airway Mallampati: I  TM Distance: >3 FB Neck ROM: Full    Dental no notable dental hx. (+) Partial Lower, Upper Dentures, Edentulous Upper,    Pulmonary shortness of breath and with exertion, former smoker,    Pulmonary exam normal breath sounds clear to auscultation       Cardiovascular hypertension, Pt. on medications + CAD, + Past MI, + CABG (2017), + Peripheral Vascular Disease and +CHF (takotsubo cardiomyopathy s/p PPM 2016; nml LVEF on echo 2017)  Normal cardiovascular exam+ dysrhythmias (eliquis) Atrial Fibrillation + pacemaker  Rhythm:Regular Rate:Normal  Last echo 2017:  Left ventricle: Normal cavity size. Concentric hypertrophy of mild  severity. LV systolic function is normal with an EF of 60-65%. There are  no obvious wall motion abnormalities. No thrombus present. No mass  present.   Aortic valve: No AV vegetation.   Mitral valve: No leaflet thickening and calcification present. Trace  regurgitation.   Right ventricle: Normal cavity size, wall thickness and ejection  fraction.   Tricuspid valve: Trace regurgitation. The tricuspid valve regurgitation  jet is central.   Pulmonic valve: Trace regurgitation.    Neuro/Psych  Headaches, PSYCHIATRIC DISORDERS Anxiety Depression    GI/Hepatic Neg liver ROS, GERD  Medicated and Controlled,  Endo/Other  diabetes, Well Controlled, Type 2Last a1c 6.1  Renal/GU   negative genitourinary   Musculoskeletal  (+) Arthritis , Rheumatoid disorders,    Abdominal   Peds  Hematology  (+) Blood dyscrasia, anemia , hct 32   Anesthesia Other Findings   Reproductive/Obstetrics negative OB ROS                            Anesthesia Physical Anesthesia  Plan  ASA: III  Anesthesia Plan: MAC and Regional   Post-op Pain Management:  Regional for Post-op pain   Induction:   PONV Risk Score and Plan: 2 and Propofol infusion and TIVA  Airway Management Planned: Natural Airway and Simple Face Mask  Additional Equipment: None  Intra-op Plan:   Post-operative Plan:   Informed Consent: I have reviewed the patients History and Physical, chart, labs and discussed the procedure including the risks, benefits and alternatives for the proposed anesthesia with the patient or authorized representative who has indicated his/her understanding and acceptance.       Plan Discussed with: CRNA  Anesthesia Plan Comments:        Anesthesia Quick Evaluation

## 2020-08-30 NOTE — Progress Notes (Signed)
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  Underwent Right common femoral, profundofemoral and SFA endarterectomy and Right common femoral to below-knee popliteal artery bypass 12/28.    PT Comments    Pt with complaints of R gastroc pain with ambulation from bed to door. Focus of session strengthening UE and stretching posterior LE muscles in preparation for R great toe amputation tomorrow. D/c plan remains appropriate. PT will continue to follow acutely.    Follow Up Recommendations  CIR     Equipment Recommendations  Rolling walker with 5" wheels;Wheelchair (measurements PT);Wheelchair cushion (measurements PT)    Recommendations for Other Services Rehab consult     Precautions / Restrictions Precautions Precautions: Fall Precaution Comments: use prosthetic shrinker to help with L LE swelling Restrictions Weight Bearing Restrictions: No    Mobility  Bed Mobility Overal bed mobility: Needs Assistance Bed Mobility: Supine to Sit     Supine to sit: Supervision     General bed mobility comments: HOB elevated, used bed rail, able to manage R LE without assist  Transfers Overall transfer level: Needs assistance Equipment used: Rolling walker (2 wheeled) Transfers: Sit to/from Stand Sit to Stand: Min guard         General transfer comment: min guard for power up to standing  Ambulation/Gait Ambulation/Gait assistance: Min guard Gait Distance (Feet): 20 Feet Assistive device: Rolling walker (2 wheeled) Gait Pattern/deviations: Step-to pattern Gait velocity: slow   General Gait Details: unable to don prosthesis due to swelling and pain, pt with good use of UE to aid in advancement of RLE         Balance Overall balance assessment: Needs assistance Sitting-balance support: Feet  supported;No upper extremity supported Sitting balance-Leahy Scale: Good Sitting balance - Comments: able to attempt to don prosthesis without LOB   Standing balance support: Bilateral upper extremity supported Standing balance-Leahy Scale: Poor Standing balance comment: dependent on RW                            Cognition Arousal/Alertness: Awake/alert Behavior During Therapy: WFL for tasks assessed/performed Overall Cognitive Status: Within Functional Limits for tasks assessed                                 General Comments: understanding that R great toe impeding her healing      Exercises General Exercises - Lower Extremity Ankle Circles/Pumps: AROM;20 reps;Seated;Right Quad Sets: AROM;Right;10 reps;Supine Other Exercises Other Exercises: tricep dips in RW with RLE stabilization x 10    General Comments General comments (skin integrity, edema, etc.): VSS      Pertinent Vitals/Pain Pain Assessment: 0-10 Pain Score: 6  Pain Location: L hip Pain Descriptors / Indicators: Burning;Sore Pain Intervention(s): Limited activity within patient's tolerance;Monitored during session;Premedicated before session;Repositioned           PT Goals (current goals can now be found in the care plan section) Acute Rehab PT Goals PT Goal Formulation: With patient Time For Goal Achievement: 09/08/20 Potential to Achieve Goals: Good Progress towards PT goals: Progressing toward goals    Frequency    Min 3X/week      PT Plan Equipment recommendations need to be updated       AM-PAC PT "6 Clicks" Mobility  Outcome Measure  Help needed turning from your back to your side while in a flat bed without using bedrails?: A Little Help needed moving from lying on your back to sitting on the side of a flat bed without using bedrails?: A Little Help needed moving to and from a bed to a chair (including a wheelchair)?: A Little Help needed standing up from a  chair using your arms (e.g., wheelchair or bedside chair)?: A Lot Help needed to walk in hospital room?: A Lot Help needed climbing 3-5 steps with a railing? : A Lot 6 Click Score: 15    End of Session Equipment Utilized During Treatment: Gait belt Activity Tolerance: Patient tolerated treatment well Patient left: in chair;with call bell/phone within reach;with chair alarm set Nurse Communication: Mobility status PT Visit Diagnosis: Unsteadiness on feet (R26.81);Other abnormalities of gait and mobility (R26.89);Muscle weakness (generalized) (M62.81);Difficulty in walking, not elsewhere classified (R26.2);Pain     Time: 0223-3612 PT Time Calculation (min) (ACUTE ONLY): 20 min  Charges:  $Therapeutic Exercise: 8-22 mins                     Kimiah Hibner B. Beverely Risen PT, DPT Acute Rehabilitation Services Pager [phone] Office [phone]    [name] 08/30/2020, 10:14 AM

## 2020-08-31 ENCOUNTER — Encounter (HOSPITAL_COMMUNITY)
Admission: RE | Disposition: A | Discharge status: Home Health | Payer: Self-pay | Source: Home / Self Care | Attending: Vascular Surgery

## 2020-08-31 ENCOUNTER — Inpatient Hospital Stay (HOSPITAL_COMMUNITY): Payer: Medicare Other | Admitting: Certified Registered"

## 2020-08-31 HISTORY — PX: AMPUTATION: SHX166

## 2020-08-31 SURGERY — AMPUTATION DIGIT
Anesthesia: Monitor Anesthesia Care | Site: Foot | Laterality: Right

## 2020-08-31 MED ORDER — FENTANYL CITRATE (PF) 250 MCG/5ML IJ SOLN
INTRAMUSCULAR | Status: AC
  Filled 2020-08-31: qty 5

## 2020-08-31 MED ORDER — PHENYLEPHRINE HCL-NACL 10-0.9 MG/250ML-% IV SOLN
INTRAVENOUS | Status: DC | PRN
  Administered 2020-08-31: 30 ug/min via INTRAVENOUS

## 2020-08-31 MED ORDER — APIXABAN 5 MG PO TABS
5.0000 mg | ORAL_TABLET | Freq: Two times a day (BID) | ORAL | Status: DC
  Administered 2020-09-01: 5 mg via ORAL
  Filled 2020-08-31: qty 1

## 2020-08-31 MED ORDER — ONDANSETRON HCL 4 MG/2ML IJ SOLN
INTRAMUSCULAR | Status: DC | PRN
  Administered 2020-08-31: 4 mg via INTRAVENOUS

## 2020-08-31 MED ORDER — FENTANYL CITRATE (PF) 100 MCG/2ML IJ SOLN
INTRAMUSCULAR | Status: DC | PRN
  Administered 2020-08-31: 50 ug via INTRAVENOUS

## 2020-08-31 MED ORDER — LACTATED RINGERS IV SOLN: INTRAVENOUS | Status: DC | PRN

## 2020-08-31 MED ORDER — MIDAZOLAM HCL 5 MG/5ML IJ SOLN
INTRAMUSCULAR | Status: DC | PRN
  Administered 2020-08-31: 2 mg via INTRAVENOUS

## 2020-08-31 MED ORDER — ROPIVACAINE HCL 5 MG/ML IJ SOLN
INTRAMUSCULAR | Status: DC | PRN
  Administered 2020-08-31: 40 mL via PERINEURAL

## 2020-08-31 MED ORDER — 0.9 % SODIUM CHLORIDE (POUR BTL) OPTIME
TOPICAL | Status: DC | PRN
  Administered 2020-08-31: 1000 mL

## 2020-08-31 MED ORDER — CEFAZOLIN SODIUM-DEXTROSE 2-3 GM-%(50ML) IV SOLR
INTRAVENOUS | Status: DC | PRN
  Administered 2020-08-31: 2 g via INTRAVENOUS

## 2020-08-31 MED ORDER — PROPOFOL 10 MG/ML IV BOLUS
INTRAVENOUS | Status: AC
  Filled 2020-08-31: qty 20

## 2020-08-31 MED ORDER — DEXAMETHASONE SODIUM PHOSPHATE 10 MG/ML IJ SOLN
INTRAMUSCULAR | Status: DC | PRN
  Administered 2020-08-31: 10 mg

## 2020-08-31 MED ORDER — MIDAZOLAM HCL 2 MG/2ML IJ SOLN
INTRAMUSCULAR | Status: AC
  Filled 2020-08-31: qty 2

## 2020-08-31 MED ORDER — PROPOFOL 500 MG/50ML IV EMUL
INTRAVENOUS | Status: DC | PRN
  Administered 2020-08-31: 75 ug/kg/min via INTRAVENOUS

## 2020-08-31 SURGICAL SUPPLY — 28 items
BNDG ELASTIC 4X5.8 VLCR STR LF (GAUZE/BANDAGES/DRESSINGS) ×2 IMPLANT
BNDG GAUZE ELAST 4 BULKY (GAUZE/BANDAGES/DRESSINGS) ×2 IMPLANT
CANISTER SUCT 3000ML PPV (MISCELLANEOUS) ×2 IMPLANT
COVER SURGICAL LIGHT HANDLE (MISCELLANEOUS) ×2 IMPLANT
COVER WAND RF STERILE (DRAPES) ×1 IMPLANT
DRAPE EXTREMITY T 121X128X90 (DISPOSABLE) ×2 IMPLANT
DRAPE HALF SHEET 40X57 (DRAPES) ×2 IMPLANT
DRSG EMULSION OIL 3X3 NADH (GAUZE/BANDAGES/DRESSINGS) ×2 IMPLANT
ELECT REM PT RETURN 9FT ADLT (ELECTROSURGICAL) ×2
ELECTRODE REM PT RTRN 9FT ADLT (ELECTROSURGICAL) ×1 IMPLANT
GAUZE SPONGE 4X4 12PLY STRL (GAUZE/BANDAGES/DRESSINGS) ×2 IMPLANT
GAUZE SPONGE 4X4 12PLY STRL LF (GAUZE/BANDAGES/DRESSINGS) ×1 IMPLANT
GLOVE BIO SURGEON STRL SZ7.5 (GLOVE) ×1 IMPLANT
GOWN STRL REUS W/ TWL LRG LVL3 (GOWN DISPOSABLE) ×3 IMPLANT
GOWN STRL REUS W/TWL LRG LVL3 (GOWN DISPOSABLE) ×6
KIT BASIN OR (CUSTOM PROCEDURE TRAY) ×2 IMPLANT
KIT TURNOVER KIT B (KITS) ×2 IMPLANT
NS IRRIG 1000ML POUR BTL (IV SOLUTION) ×2 IMPLANT
PACK GENERAL/GYN (CUSTOM PROCEDURE TRAY) ×2 IMPLANT
PAD ARMBOARD 7.5X6 YLW CONV (MISCELLANEOUS) ×4 IMPLANT
SPECIMEN JAR SMALL (MISCELLANEOUS) ×1 IMPLANT
SUT ETHILON 3 0 PS 1 (SUTURE) ×3 IMPLANT
SUT VIC AB 3-0 SH 27 (SUTURE) ×2
SUT VIC AB 3-0 SH 27X BRD (SUTURE) IMPLANT
TOWEL GREEN STERILE (TOWEL DISPOSABLE) ×4 IMPLANT
TOWEL GREEN STERILE FF (TOWEL DISPOSABLE) ×2 IMPLANT
UNDERPAD 30X36 HEAVY ABSORB (UNDERPADS AND DIAPERS) ×2 IMPLANT
WATER STERILE IRR 1000ML POUR (IV SOLUTION) ×2 IMPLANT

## 2020-08-31 NOTE — Op Note (Signed)
Procedure: Amputation right first toe with resection of metatarsal head  Preoperative diagnosis: Gangrene right first toe  Postoperative diagnosis: Same  Anesthesia: Ankle block with sedation  Assistant: Nurse  Operative findings: Purulent collection distal tip of right first toe  Operative details: After obtaining form consent, the patient was taken the operating.  The patient was placed in supine position operating table.  An ankle block had been previously placed by the anesthesia team.  The block was tested with a penetrating towel clamp and the first toe.  There was purulent drainage from this upon placing a towel clamp.  A circumferential incision was then made at the base of the first toe all the way down to the bone.  The bone was then transected with bone cutters.  I began to debride away the proximal phalanx with rongeurs.  The bone was very soft in character and did not appear healthy.  Therefore I remove the entire proximal phalanx and also resected the metatarsal head.  The wound was then thoroughly irrigated with normal saline solution.  The skin edges were then approximated with interrupted 3 oh vertical mattress and simple nylon sutures.  The patient tolerated procedure well and there were no complications.  The instrument sponge and needle count was correct the end of the case.  The patient was taken to recovery in stable condition.  Fabienne Bruns, MD [facility] [place] Office: [phone]

## 2020-08-31 NOTE — Anesthesia Postprocedure Evaluation (Signed)
Anesthesia Post Note  Patient: Mercedes [name]  Procedure(s) Performed: Right first toe amputation (Right Foot)     Patient location during evaluation: PACU Anesthesia Type: Regional and MAC Level of consciousness: awake and alert Pain management: pain level controlled Vital Signs Assessment: post-procedure vital signs reviewed and stable Respiratory status: spontaneous breathing, nonlabored ventilation and respiratory function stable Cardiovascular status: blood pressure returned to baseline and stable Postop Assessment: no apparent nausea or vomiting Anesthetic complications: no   No complications documented.  Last Vitals:  Vitals:   08/31/20 0345 08/31/20 0822  BP: 104/65   Pulse: 66   Resp: 15   Temp: 36.9 C 36.4 C  SpO2: 100% 94%    Last Pain:  Vitals:   08/31/20 0822  TempSrc:   PainSc: 0-No pain                 Pervis Hocking

## 2020-08-31 NOTE — Progress Notes (Signed)
Mobility Specialist - Progress Note   08/31/20 1217  Mobility  Activity Contraindicated/medical hold   Instructed by RN to hold mobility today.   Mamie Levers Mobility Specialist Mobility Specialist Phone: [phone]

## 2020-08-31 NOTE — Interval H&P Note (Signed)
History and Physical Interval Note:  08/31/2020 7:28 AM  Mercedes [name]  has presented today for surgery, with the diagnosis of PAD.  The various methods of treatment have been discussed with the patient and family. After consideration of risks, benefits and other options for treatment, the patient has consented to  Procedure(s): Right first toe amputation (Right) as a surgical intervention.  The patient's history has been reviewed, patient examined, no change in status, stable for surgery.  I have reviewed the patient's chart and labs.  Questions were answered to the patient's satisfaction.     Fabienne Bruns

## 2020-08-31 NOTE — Progress Notes (Signed)
Orthopedic Tech Progress Note Patient Details:  Mercedes [name] 1955/10/19 [id number]  Ortho Devices Type of Ortho Device: Postop shoe/boot Ortho Device/Splint Location: RLE Ortho Device/Splint Interventions: Ordered,Application   Post Interventions Patient Tolerated: Well Instructions Provided: Care of device   Donald Pore 08/31/2020, 11:28 AM

## 2020-08-31 NOTE — Anesthesia Procedure Notes (Signed)
Anesthesia Regional Block: Adductor canal block   Pre-Anesthetic Checklist: ,, timeout performed, Correct Patient, Correct Site, Correct Laterality, Correct Procedure, Correct Position, site marked, Risks and benefits discussed,  Surgical consent,  Pre-op evaluation,  At surgeon's request and post-op pain management  Laterality: Right  Prep: Maximum Sterile Barrier Precautions used, chloraprep       Needles:  Injection technique: Single-shot  Needle Type: Echogenic Stimulator Needle     Needle Length: 9cm  Needle Gauge: 22     Additional Needles:   Procedures:,,,, ultrasound used (permanent image in chart),,,,  Narrative:  Start time: 08/31/2020 7:15 AM End time: 08/31/2020 7:20 AM Injection made incrementally with aspirations every 5 mL.  Performed by: Personally  Anesthesiologist: Lannie Fields, DO  Additional Notes: Monitors applied. No increased pain on injection. No increased resistance to injection. Injection made in 5cc increments. Good needle visualization. Patient tolerated procedure well.

## 2020-08-31 NOTE — Transfer of Care (Signed)
Immediate Anesthesia Transfer of Care Note  Patient: Mercedes [name]  Procedure(s) Performed: Right first toe amputation (Right Foot)  Patient Location: PACU  Anesthesia Type: MAC and Regional  Level of Consciousness: awake, alert  and oriented  Airway & Oxygen Therapy: Patient Spontanous Breathing  Post-op Assessment: Report given to RN and Post -op Vital signs reviewed and stable  Post vital signs: Reviewed and stable  Last Vitals:  Vitals Value Taken Time  BP 97/62 08/31/20 0821  Temp    Pulse 58 08/31/20 0824  Resp 16 08/31/20 0824  SpO2 96 % 08/31/20 0824  Vitals shown include unvalidated device data.  Last Pain:  Vitals:   08/31/20 0345  TempSrc: Oral  PainSc:       Patients Stated Pain Goal: 4 (29/56/21 3086)  Complications: No complications documented.

## 2020-08-31 NOTE — Progress Notes (Signed)
Returned from PACU.  A&O. Vital signs taken.  Denies pain bilateral LE

## 2020-08-31 NOTE — Anesthesia Procedure Notes (Signed)
Procedure Name: MAC Date/Time: 08/31/2020 7:35 AM Performed by: Griffin Dakin, CRNA Pre-anesthesia Checklist: Patient identified, Emergency Drugs available, Suction available, Patient being monitored and Timeout performed Patient Re-evaluated:Patient Re-evaluated prior to induction Oxygen Delivery Method: Simple face mask Induction Type: IV induction Placement Confirmation: positive ETCO2 and breath sounds checked- equal and bilateral Dental Injury: Teeth and Oropharynx as per pre-operative assessment

## 2020-08-31 NOTE — Anesthesia Procedure Notes (Signed)
Anesthesia Regional Block: Popliteal block   Pre-Anesthetic Checklist: ,, timeout performed, Correct Patient, Correct Site, Correct Laterality, Correct Procedure, Correct Position, site marked, Risks and benefits discussed,  Surgical consent,  Pre-op evaluation,  At surgeon's request and post-op pain management  Laterality: Right  Prep: Maximum Sterile Barrier Precautions used, chloraprep       Needles:  Injection technique: Single-shot  Needle Type: Echogenic Stimulator Needle     Needle Length: 9cm  Needle Gauge: 22     Additional Needles:   Procedures:,,,, ultrasound used (permanent image in chart),,,,  Narrative:  Start time: 08/31/2020 7:10 AM End time: 08/31/2020 7:15 AM Injection made incrementally with aspirations every 5 mL.  Performed by: Personally  Anesthesiologist: Lannie Fields, DO  Additional Notes: Monitors applied. No increased pain on injection. No increased resistance to injection. Injection made in 5cc increments. Good needle visualization. Patient tolerated procedure well.

## 2020-09-01 ENCOUNTER — Inpatient Hospital Stay (HOSPITAL_COMMUNITY): Payer: Medicare Other

## 2020-09-01 ENCOUNTER — Encounter (HOSPITAL_COMMUNITY): Payer: Self-pay | Admitting: Vascular Surgery

## 2020-09-01 DIAGNOSIS — I739 Peripheral vascular disease, unspecified: Secondary | ICD-10-CM

## 2020-09-01 DIAGNOSIS — I480 Paroxysmal atrial fibrillation: Secondary | ICD-10-CM

## 2020-09-01 DIAGNOSIS — I200 Unstable angina: Secondary | ICD-10-CM | POA: Diagnosis not present

## 2020-09-01 DIAGNOSIS — Z951 Presence of aortocoronary bypass graft: Secondary | ICD-10-CM | POA: Diagnosis not present

## 2020-09-01 DIAGNOSIS — I495 Sick sinus syndrome: Secondary | ICD-10-CM | POA: Diagnosis not present

## 2020-09-01 DIAGNOSIS — R079 Chest pain, unspecified: Secondary | ICD-10-CM

## 2020-09-01 LAB — COMPREHENSIVE METABOLIC PANEL
ALT: 15 U/L (ref 0–44)
AST: 20 U/L (ref 15–41)
Albumin: 2.4 g/dL — ABNORMAL LOW (ref 3.5–5.0)
Alkaline Phosphatase: 74 U/L (ref 38–126)
Anion gap: 9 (ref 5–15)
BUN: 18 mg/dL (ref 8–23)
CO2: 25 mmol/L (ref 22–32)
Calcium: 9 mg/dL (ref 8.9–10.3)
Chloride: 99 mmol/L (ref 98–111)
Creatinine, Ser: 0.73 mg/dL (ref 0.44–1.00)
GFR, Estimated: >60 mL/min (ref >60)
Glucose, Bld: 103 mg/dL — ABNORMAL HIGH (ref 70–99)
Potassium: 4 mmol/L (ref 3.5–5.1)
Sodium: 133 mmol/L — ABNORMAL LOW (ref 135–145)
Total Bilirubin: 0.5 mg/dL (ref 0.3–1.2)
Total Protein: 6.7 g/dL (ref 6.5–8.1)

## 2020-09-01 LAB — CBC
HCT: 33.2 % — ABNORMAL LOW (ref 36.0–46.0)
Hemoglobin: 10.1 g/dL — ABNORMAL LOW (ref 12.0–15.0)
MCH: 24.2 pg — ABNORMAL LOW (ref 26.0–34.0)
MCHC: 30.4 g/dL (ref 30.0–36.0)
MCV: 79.6 fL — ABNORMAL LOW (ref 80.0–100.0)
Platelets: 472 10*3/uL — ABNORMAL HIGH (ref 150–400)
RBC: 4.17 MIL/uL (ref 3.87–5.11)
RDW: 15.8 % — ABNORMAL HIGH (ref 11.5–15.5)
WBC: 10.4 10*3/uL (ref 4.0–10.5)
nRBC: 0 % (ref 0.0–0.2)

## 2020-09-01 LAB — TROPONIN I (HIGH SENSITIVITY): Troponin I (High Sensitivity): 5 ng/L (ref <18)

## 2020-09-01 MED ORDER — HEPARIN (PORCINE) 25000 UT/250ML-% IV SOLN
550.0000 [IU]/h | INTRAVENOUS | Status: DC
  Administered 2020-09-01: 550 [IU]/h via INTRAVENOUS
  Filled 2020-09-01: qty 250

## 2020-09-01 NOTE — Plan of Care (Signed)
  Problem: Health Behavior/Discharge Planning: Goal: Ability to manage health-related needs will improve Outcome: Progressing   

## 2020-09-01 NOTE — Progress Notes (Addendum)
Progress Note    09/01/2020 7:09 AM 1 Day Post-Op  Subjective:  She is requesting to call her cardiologist because her "pacemaker battery needs charging", she has felt anterior chest "tightness for several days and light headedness". No cardiac issues during surgery yesterday. Denies SOB   Vitals:   08/31/20 2337 09/01/20 0400  BP: (!) 113/55 126/63  Pulse:  (!) 53  Resp: 15 16  Temp: 98 F (36.7 C) 97.8 F (36.6 C)  SpO2: 97% 98%    Physical Exam: General: Awake, alert in ND Cardiac:  RRR. nonlabored Lungs:  CTAB Incisions:  Right groin and lower leg incisions healing nicely Extremities:  Left foot surgical dressing is dry and intact   Left hip films: IMPRESSION: Bony overgrowth along the superolateral left acetabulum potentially places patient at increased risk for femoroacetabular impingement. No fracture or dislocation. No appreciable joint space narrowing. Foci of atherosclerotic arterial vascular calcification noted. CBC    Component Value Date/Time   WBC 6.3 08/27/2020 0056   RBC 3.99 08/27/2020 0056   HGB 9.7 (L) 08/27/2020 0056   HGB 14.2 10/09/2018 1125   HCT 32.0 (L) 08/27/2020 0056   HCT 44.6 10/09/2018 1125   PLT 298 08/27/2020 0056   PLT 289 10/09/2018 1125   MCV 80.2 08/27/2020 0056   MCV 79 10/09/2018 1125   MCH 24.3 (L) 08/27/2020 0056   MCHC 30.3 08/27/2020 0056   RDW 16.3 (H) 08/27/2020 0056   RDW 14.8 10/09/2018 1125   LYMPHSABS 1.7 08/27/2020 0056   MONOABS 0.4 08/27/2020 0056   EOSABS 0.4 08/27/2020 0056   BASOSABS 0.0 08/27/2020 0056    BMET    Component Value Date/Time   NA 133 (L) 08/25/2020 0300   NA 135 10/09/2018 1125   K 4.2 08/25/2020 0300   CL 102 08/25/2020 0300   CO2 22 08/25/2020 0300   GLUCOSE 98 08/25/2020 0300   BUN 12 08/25/2020 0300   BUN 10 10/09/2018 1125   CREATININE 0.75 08/25/2020 0300   CREATININE 1.13 (H) 08/03/2016 1440   CALCIUM 8.9 08/25/2020 0300   GFRNONAA >60 08/25/2020 0300   GFRAA >60  11/24/2019 0620     Intake/Output Summary (Last 24 hours) at 09/01/2020 0709 Last data filed at 09/01/2020 0431 Gross per 24 hour  Intake 480 ml  Output 2300 ml  Net -1820 ml    HOSPITAL MEDICATIONS Scheduled Meds: . apixaban  5 mg Oral BID  . aspirin EC  81 mg Oral Daily  . docusate sodium  100 mg Oral Daily  . ezetimibe  10 mg Oral Daily  . metoprolol tartrate  12.5 mg Oral BID  . pantoprazole  40 mg Oral Daily  . Ensure Max Protein  8 oz Oral Daily  . rosuvastatin  40 mg Oral QHS  . sodium chloride flush  3 mL Intravenous Q12H   Continuous Infusions: . sodium chloride    . magnesium sulfate bolus IVPB     PRN Meds:.sodium chloride, acetaminophen, alum & mag hydroxide-simeth, guaiFENesin-dextromethorphan, hydrALAZINE, HYDROmorphone (DILAUDID) injection, labetalol, magnesium sulfate bolus IVPB, nitroGLYCERIN, ondansetron (ZOFRAN) IV, oxyCODONE-acetaminophen, phenol, potassium chloride, sodium chloride flush, sodium phosphate  Assessment:     POD8right fem to BK pop bypass with PTFE.  Afebrile. RLE well perfused.  POD 1 Right great toe amputation   Anterior chest tightness-history of leadless pacemaker implantation 2016 by Dr Johney Frame for symptomatic tachycardia bradycardia syndrome and atrial fibrillation. Stable VS. Tele: sinus bradycardia. Attendant RN called telemetry monitor tech for review: patient has  had rare PVCs o/w sinus brady, no pacer spikes.   Plan: -Will get EKG, troponins, CBC, BMP. Will ask cardiology to evaluate. -Eliquis restarted -Take down surgical dressing tomorrow - PT re-eval -Follow-up TOC -continue aspirin and Plavix  Wendi Maya, PA-C [facility] [phone] 09/01/2020  7:09 AM   Agree with above. Pursue rehab or home at this point. She is overdue for Cardiology follow up  Will get someone from Dr Allred'd team to see her while she is here Apixiban resume  Fabienne Bruns, MD [facility]  [place] Office: [phone]

## 2020-09-01 NOTE — Consult Note (Addendum)
Cardiology Consultation:   Patient ID: AAMORI MCMASTERS MRN: [id number]; DOB: 18-Jul-1956  Admit date: 08/23/2020 Date of Consult: 09/01/2020  Primary Care Provider: Pincus Sanes, MD [facility] Cardiologist: Olga Millers, MD  [facility] Electrophysiologist:  Hillis Range, MD    Patient Profile:   Mercedes [name] is a 65 y.o. female with a hx of  CAD with hx of PCIs remotely, most recently CABG Nov 2017, HTN, HLD, PAFib, tachy-brady w/ PPM (leadless), SLE, RA and hx of Takotsubo CM, last echo with imporved EF, PVD w/left fem-pop bypass in 2015 , then AKA feb 2021,  who is being seen today for the evaluation of pacemaker battery RRT/bradycardia at the request of Dr. Allyson Sabal.   History of Present Illness:   Mercedes [name] last saw Dr. Johney Frame in Oct 21, having difficulty with wound healing, device was interrogated noteng again RRT, they discussed again removal and new implant though she did not want to pursue that.  She was admitted to [facility] 08/23/20 with dusky R foot admitted by vascular for runoff and further evaluation, noted critical RLE ischemia, planned for R CFA endarterectomy and R fem-peroneal bypass 08/24/20 underwent Procedure Performed: 1.  Right common femoral, profundofemoral and SFA endarterectomy 2.  Right common femoral to below-knee popliteal artery bypass with 6 mm ringed PTFE  She developed R toe pain with ischemic changes/gangrene and underwent R great toe amputation yesterday  Today she reported several days of chest tightness and lightheadedness and requested cardiology consult and mentioned her pacemaker needed charging.  Cardiology has consulted, there is concern her CP is angina and her eliquis held and started on heparin gtt, planned for stress testing tomorrow as well as carotid US. Noted HR 40's-50's that they feel she is symptomatic from and asking EP to see her regarding pacemaker that has been RRT since 2020.  LABS K+ 4.0 Mag 2.4 BUN/Creat  18/0.73 WBC 10.4 H/H 10/33 Plts 472  HS Trop 5  COVID neg  She is currently feeling quite well, denies any ongoig CP, says she had a nagging tightness, uncomfortable chest discomfort most of the night that kept her awake, but today is gone She denies any lightheadedness or dizziness here.  She reports a week ago at home was seated in her wheelchair bent forward and felt lightheaded, that persisted a few seconds upon raising back up, though resolved fairly quickly.  She denies other episodes, she has not fainted or felt like fainting.  She would like to discuss pacer with Dr. Johney Frame again  SJM/research team Hajer Dwyer be by later this afternoon to check her device though I suspect that it is EOL given her last known programming was VVI 50 and she has ad HR in high 40's without pacing observed   Device History: STJ leadless PPM implanted 10/2014 for tachy/brady syndrome/post termination pauses  Reached RRT Feb 2020   Past Medical History:  Diagnosis Date   Abnormal LFTs    a. in the past when on Lipitor   Anemia    as a young woman   Anxiety    Arnold-Chiari malformation, type I (HCC)    MRI brain 02/2010   CAD (coronary artery disease)    a. NSTEMI 07/2009: LHC - D2 40%, LAD irreg., EF 50% with apical AK (tako-tsubo CM);  b. Inf STEMI (04/2013): Tx Promus DES to CFX;  c. 08/2013 Lexi CL: No ischemia, dist ant/ap/inf-ap infarct, EF  d. 06/2016 NSTEMI with LM disease: s/p CABG on 07/17/2016 w/ LIMA-LAD, SVG-OM,  and SVG-dRCA.   DEPRESSION    Dizziness    ? 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 anticoagulated with Eliquis 10/2014   Presence  of permanent cardiac pacemaker 10/2014   leadless permanent pacemaker   RA (rheumatoid arthritis) (HCC)    prior tx by Dr. Dareen Piano   Sjogren's syndrome (HCC) 06/02/13   pt denies this (12/01/13)   SLE (systemic lupus erythematosus) (HCC) dx 05/2013   follows with rheum Dareen Piano)   Tachy-brady syndrome [facility])    a. 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   f/u echo 09/2009: EF 50-55%, mild LVH, mod diast dysfxn, mild apical HK   Wears dentures    Wears glasses     Past Surgical History:  Procedure Laterality Date   ABDOMINAL AORTAGRAM N/A 11/24/2013   Procedure: ABDOMINAL AORTAGRAM WITH BILATERAL RUNOFF WITH POSSIBLE INTERVENTION;  Surgeon: Chuck Hint, MD;  Location: [facility] CATH LAB;  Service: Cardiovascular;  Laterality: N/A;   ABDOMINAL AORTOGRAM W/LOWER EXTREMITY N/A 04/25/2019   Procedure: ABDOMINAL AORTOGRAM W/LOWER EXTREMITY;  Surgeon: Sherren Kerns, MD;  [facility]: [facility] INVASIVE CV LAB;  Service: Cardiovascular;  Laterality: N/A;   ABDOMINAL AORTOGRAM W/LOWER EXTREMITY Bilateral 05/15/2019   Procedure: ABDOMINAL AORTOGRAM W/LOWER EXTREMITY;  Surgeon: Nada Libman, MD;  Location: [facility] INVASIVE CV LAB;  Service: Cardiovascular;  Laterality: Bilateral;   ABDOMINAL AORTOGRAM W/LOWER EXTREMITY N/A 08/23/2020   Procedure: ABDOMINAL AORTOGRAM W/LOWER EXTREMITY;  Surgeon: Maeola Harman, MD;  Location: [facility] INVASIVE CV LAB;  Service: Cardiovascular;  Laterality: N/A;   AMPUTATION Left 12/02/2013   Procedure: LEFT 1ST TOE AMPUTATION;  Surgeon: Sherren Kerns, MD;  Location: [facility] OR;  Service: Vascular;  Laterality: Left;   AMPUTATION Left 07/16/2019   Procedure: LEFT TRANSMETATARSAL AMPUTATION;  Surgeon: Nadara Mustard, MD;  Location: [facility] OR;  Service: Orthopedics;  Laterality: Left;   AMPUTATION Left 10/01/2019   Procedure: LEFT AMPUTATION ABOVE KNEE;  Surgeon: Sherren Kerns, MD;  Location: [facility] OR;  Service: Vascular;  Laterality: Left;    AMPUTATION Right 08/31/2020   Procedure: Right first toe amputation;  Surgeon: Sherren Kerns, MD;  Location: [facility] OR;  Service: Vascular;  Laterality: Right;   APPLICATION OF WOUND VAC Left 11/03/2019   Procedure: Application Of Wound Vac, left lower extremity;  Surgeon: Sherren Kerns, MD;  Location: [facility] OR;  Service: Vascular;  Laterality: Left;   CARDIAC CATHETERIZATION N/A 07/12/2016   Procedure: Left Heart Cath and Coronary Angiography;  Surgeon: Lennette Bihari, MD;  Location: [facility] INVASIVE CV LAB;  Service: Cardiovascular;  Laterality: N/A;   CARDIAC CATHETERIZATION N/A 07/14/2016   Procedure: Intravascular Ultrasound/IVUS;  Surgeon: Yvonne Kendall, MD;  Location: [facility] INVASIVE CV LAB;  Service: Cardiovascular;  Laterality: N/A;   COLONOSCOPY     CORONARY ANGIOPLASTY  04/2013   CORONARY ARTERY BYPASS GRAFT N/A 07/17/2016   Procedure: CORONARY ARTERY BYPASS GRAFTING (CABG)x3 using right greater saphenous vein harvested endoscopiclly and left internal mammary artery;  Surgeon: Kerin Perna, MD;  Location: [facility] OR;  Service: Open Heart Surgery;  Laterality: N/A;   ENDARTERECTOMY FEMORAL Left 05/12/2019   Procedure: LEFT Femoral Endarterectomy;  Surgeon: Sherren Kerns, MD;  Location: [facility] OR;  Service: Vascular;  Laterality: Left;   FEMORAL-POPLITEAL BYPASS GRAFT Left 12/02/2013   Procedure:  LEFT FEMORAL-BELOW KNEE POPLITEAL ARTERY BYPASS GRAFT;  Surgeon: Sherren Kerns, MD;  Location: [facility] OR;  Service: Vascular;  Laterality: Left;   FEMORAL-POPLITEAL BYPASS GRAFT Left 09/25/2019   Procedure: THROMBECTOMY and PROPATEN BYPASS  FEMORAL TO BELOW KNEE POPLITEAL ARTERY BYPASS GRAFT LEFT LEG;  Surgeon: Larina Earthly, MD;  Location: [facility] OR;  Service: Vascular;  Laterality: Left;   FEMORAL-POPLITEAL BYPASS GRAFT Right 08/24/2020   Procedure: RIGHT FEMORAL-BELOW KNEE POPLITEAL ARTERY BYPASS USING GORE PROPATEN GRAFT;  Surgeon: Maeola Harman, MD;  Location: [facility] OR;  Service: Vascular;   Laterality: Right;   LEFT HEART CATHETERIZATION WITH CORONARY ANGIOGRAM N/A 05/19/2013   Procedure: LEFT HEART CATHETERIZATION WITH CORONARY ANGIOGRAM;  Surgeon: Micheline Chapman, MD;  Location: [facility] CATH LAB;  Service: Cardiovascular;  Laterality: N/A;   LEFT HEART CATHETERIZATION WITH CORONARY ANGIOGRAM N/A 10/28/2014   Procedure: LEFT HEART CATHETERIZATION WITH CORONARY ANGIOGRAM;  Surgeon: Iran Ouch, MD;  Location: [facility] CATH LAB;  Service: Cardiovascular;  Laterality: N/A;   LOWER EXTREMITY ANGIOGRAM Left 05/12/2019   Procedure: Lower Extremity Angiogram;  Surgeon: Sherren Kerns, MD;  Location: [facility] OR;  Service: Vascular;  Laterality: Left;   PATCH ANGIOPLASTY Left 05/12/2019   Procedure: Left Femoral Patch Angioplasty USING CADAVERIC SAPHENOUS VEIN;  Surgeon: Sherren Kerns, MD;  Location: [facility] OR;  Service: Vascular;  Laterality: Left;   PERCUTANEOUS CORONARY STENT INTERVENTION (PCI-S)  05/19/2013   Procedure: PERCUTANEOUS CORONARY STENT INTERVENTION (PCI-S);  Surgeon: Micheline Chapman, MD;  Location: [facility] CATH LAB;  Service: Cardiovascular;;   PERMANENT PACEMAKER INSERTION N/A 11/04/2014   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   TEE WITHOUT CARDIOVERSION N/A 07/17/2016   Procedure: TRANSESOPHAGEAL ECHOCARDIOGRAM (TEE);  Surgeon: Kerin Perna, MD;  Location: [facility] OR;  Service: Open Heart Surgery;  Laterality: N/A;   THROMBECTOMY FEMORAL ARTERY Left 05/12/2019   Procedure: Left Ilio-Femoral Artery and Femoral-popliteal Graft Thrombectomy;  Surgeon: Sherren Kerns, MD;  Location: [facility] OR;  Service: Vascular;  Laterality: Left;   TUBAL LIGATION     VISCERAL ANGIOGRAPHY N/A 04/25/2019   Procedure: MESENTRIC  ANGIOGRAPHY;  Surgeon: Sherren Kerns, MD;  [facility] INVASIVE CV LAB;  Service: Cardiovascular;  Laterality: N/A;   WOUND DEBRIDEMENT Left 11/03/2019   Procedure: Debridement Wound of left above the knee amputation;  Surgeon: Sherren Kerns, MD;  Location: [facility]  OR;  Service: Vascular;  Laterality: Left;     Home Medications:  Prior to Admission medications   Medication Sig Start Date End Date Taking? Authorizing Provider  acetaminophen (TYLENOL) 500 MG tablet Take 500-1,000 mg by mouth every 8 (eight) hours as needed for mild pain or headache.   Yes [provider]  Ensure Max Protein (ENSURE MAX PROTEIN) LIQD Take 8 oz by mouth daily.   Yes [provider]  metoprolol tartrate (LOPRESSOR) 25 MG tablet Take 0.5 tablets (12.5 mg total) by mouth 2 (two) times daily. 07/18/19  Yes Lewayne Bunting, MD  oxyCODONE-acetaminophen (PERCOCET) 5-325 MG tablet Take 1 tablet by mouth every 6 (six) hours as needed for severe pain. 08/19/20 08/19/21 Yes Lars Mage, PA-C  rosuvastatin (CRESTOR) 40 MG tablet Take 40 mg by mouth at bedtime.   Yes [provider]  ELIQUIS 5 MG TABS tablet TAKE 1 TABLET BY MOUTH TWICE DAILY Patient taking differently: Take 5 mg by mouth 2 (two) times daily. 07/12/20   Allred, Jeneen Rinks, MD  nitroGLYCERIN (NITROSTAT) 0.4 MG SL tablet Place 0.4 mg under the tongue every 4 (four) hours as needed. 06/25/20   [provider]  traMADol (ULTRAM) 50 MG tablet Take 1 tablet (50 mg total) by mouth every 6 (six) hours as needed. Patient not taking: Reported on 08/19/2020 08/16/20   Ulyses Amor, PA-C    Inpatient Medications: Scheduled Meds:  aspirin EC  81 mg Oral Daily   docusate sodium  100 mg Oral Daily   ezetimibe  10 mg Oral Daily   metoprolol tartrate  12.5 mg Oral BID   pantoprazole  40 mg Oral Daily   Ensure Max Protein  8 oz Oral Daily   rosuvastatin  40 mg Oral QHS   sodium chloride flush  3 mL Intravenous Q12H   Continuous Infusions:  sodium chloride     magnesium sulfate bolus IVPB     PRN Meds: sodium chloride, acetaminophen, alum & mag hydroxide-simeth, guaiFENesin-dextromethorphan, hydrALAZINE, HYDROmorphone (DILAUDID) injection, labetalol, magnesium sulfate bolus IVPB,  nitroGLYCERIN, ondansetron (ZOFRAN) IV, oxyCODONE-acetaminophen, phenol, potassium chloride, sodium chloride flush, sodium phosphate  Allergies:    Allergies  Allergen Reactions   Brilinta [Ticagrelor] Anaphylaxis, Shortness Of Breath and Other (See Comments)    Also, chest tightness   Latex Rash   Tape Rash    Social History:   Social History   Socioeconomic History   Marital status: Single    Spouse name: Not on file   Number of children: 3   Years of education: Not on file   Highest education level: Not on file  Occupational History   Occupation: retired  Tobacco Use   Smoking status: Former Smoker    Types: Cigarettes    Quit date: 2019    Years since quitting: 3.0   Smokeless tobacco: Never Used   Tobacco comment:    Scientific laboratory technician Use: Never used  Substance and Sexual Activity   Alcohol use: No    Alcohol/week: 0.0 standard drinks   Drug use: Not Currently    Types: Marijuana, Other-see comments    Comment:  and CBD oil  last use of both was 10/29/19   Sexual activity: Not on file  Other Topics Concern   Not on file  Social History Narrative   Lives alone.   Social Determinants of Health   Financial Resource Strain: Low Risk    Difficulty of Paying Living Expenses: Not hard at all  Food Insecurity: No Food Insecurity   Worried About Charity fundraiser in the Last Year: Never true   [place] in the Last Year: Never true  Transportation Needs: No Transportation Needs   Lack of Transportation (Medical): No   Lack of Transportation (Non-Medical): No  Physical Activity: Inactive   Days of Exercise per Week: 0 days   Minutes of Exercise per Session: 0 min  Stress: No Stress Concern Present   Feeling of Stress : Not at all  Social Connections: Unknown   Frequency of Communication with Friends and Family: More than three times a week   Frequency of Social Gatherings with Friends and Family: Once a week   Attends Religious Services: Patient refused    Active Member of Clubs or Organizations: Patient refused   Attends Archivist Meetings: Patient refused  Marital Status: Never married  Catering manager Violence: Not At Risk   Fear of Current or Ex-Partner: No   Emotionally Abused: No   Physically Abused: No   Sexually Abused: No    Family History:   Family History  Problem Relation Age of Onset   Arthritis Mother    Emphysema Mother    Arthritis Father    COPD Father    Heart disease Father    Diabetes Paternal Aunt        paternal Aunts   Heart disease Paternal Aunt    Thyroid disease Neg Hx      ROS:  Please see the history of present illness.  All other ROS reviewed and negative.     Physical Exam/Data:   Vitals:   08/31/20 2000 08/31/20 2337 09/01/20 0400 09/01/20 0730  BP:  (!) 113/55 126/63 130/71  Pulse:   (!) 53 (!) 54  Resp: 19 15 16 20   Temp:  98 F (36.7 C) 97.8 F (36.6 C) 97.8 F (36.6 C)  TempSrc:  Oral Oral Oral  SpO2:  97% 98% 98%  Weight:      Height:        Intake/Output Summary (Last 24 hours) at 09/01/2020 1406 Last data filed at 09/01/2020 1219 Gross per 24 hour  Intake 480 ml  Output 1600 ml  Net -1120 ml   Last 3 Weights 08/23/2020 05/31/2020 01/15/2020  Weight (lbs) 89 lb 98 lb 3.2 oz 95 lb  Weight (kg) 40.37 kg 44.543 kg 43.092 kg     Body mass index is 14.81 kg/m.  General:  Well nourished, well developed, in no acute distress, quite animated HEENT: normal Lymph: no adenopathy Neck: no JVD Endocrine:  No thryomegaly Vascular: No carotid bruits Cardiac:  RRR; no murmurs gallops or rubs Lungs:  CTA b/l  Abd: soft, nontender, no hepatomegaly  Ext: left AKA, R foot is bandaged/ACEapped Musculoskeletal:  Left AKA Skin: warm and dry  Neuro: no focal abnormalities noted Psych:  Normal affect   EKG:  The EKG was personally reviewed and demonstrates:    SB 51bpm, PR , QRS 114, no ST changes   Telemetry:  Telemetry was personally reviewed and demonstrates:    SB/SR, rates 40's-70's, in the last 24 hours or so more persistently high 40's-50's No rates <45 noted occ PVCs, PACs, no AFib  Relevant CV Studies:  07/17/16: TEE Result status: Final result  Left ventricle: Normal cavity size. Concentric hypertrophy of mild severity. LV systolic function is normal with an EF of 60-65%. There are no obvious wall motion abnormalities. No thrombus present. No mass present. Aortic valve: No AV vegetation. Mitral valve: No leaflet thickening and calcification present. Trace regurgitation. Right ventricle: Normal cavity size, wall thickness and ejection fraction. Tricuspid valve: Trace regurgitation. The tricuspid valve regurgitation jet is central. Pulmonic valve: Trace regurgitation.     Laboratory Data:  High Sensitivity Troponin:   Recent Labs  Lab 09/01/20 0829  TROPONINIHS 5     Chemistry Recent Labs  Lab 09/01/20 0829  NA 133*  K 4.0  CL 99  CO2 25  GLUCOSE 103*  BUN 18  CREATININE 0.73  CALCIUM 9.0  GFRNONAA >60  ANIONGAP 9    Recent Labs  Lab 09/01/20 0829  PROT 6.7  ALBUMIN 2.4*  AST 20  ALT 15  ALKPHOS 74  BILITOT 0.5   Hematology Recent Labs  Lab 08/27/20 0056 09/01/20 0829  WBC 6.3 10.4  RBC 3.99 4.17  HGB  9.7* 10.1*  HCT 32.0* 33.2*  MCV 80.2 79.6*  MCH 24.3* 24.2*  MCHC 30.3 30.4  RDW 16.3* 15.8*  PLT 298 472*   BNPNo results for input(s): BNP, PROBNP in the last 168 hours.  DDimer No results for input(s): DDIMER in the last 168 hours.   Radiology/Studies:  DG HIP UNILAT WITH PELVIS 2-3 VIEWS LEFT Result Date: 08/30/2020 CLINICAL DATA:  Pain EXAM: DG HIP (WITH OR WITHOUT PELVIS) 2-3V LEFT COMPARISON:  None. FINDINGS: Frontal pelvis as well as frontal and lateral left hip images were obtained. There is no appreciable fracture or dislocation. Joint spaces appear normal. No erosive change. There is bony overgrowth along the superolateral left acetabulum. There are scattered foci of arterial vascular  calcification. IMPRESSION: Bony overgrowth along the superolateral left acetabulum potentially places patient at increased risk for femoroacetabular impingement. No fracture or dislocation. No appreciable joint space narrowing. Foci of atherosclerotic arterial vascular calcification noted. Electronically Signed   By: Bretta Bang III M.D.   On: 08/30/2020 08:23     Assessment and Plan:   1. Tachy-brady, h/o post termination pauses w/ PPM 2. PPM implanted 2016 device had battery failure and reached RRT 2020     Patient has not wanted to pursue new PPM     Has been maintained chronically on low dose lopressor 12.5mg  BID     SB not entirely sure that she is overtly symptomatic from this, though with SB indication she Jayveion Stalling need AV pacing and this would require traditional dual chamber PPM     recent gangrenous toe and amputation are concerning for increased infection risk  Dr. Elberta Fortis has seen and examined the patient, d/w the patient, no need for urgent pacing at this time and would allow healing of her foot 1st prior to PPM implant As we are with her she is sitting up, animated and without symptoms, HR 50's-60's The patient was OK with this [place] Nienhaus update Dr. Johney Frame, for now, plan out patient follow up.   3. Paroxysmal AFib     CHA2DS2Vasc is at least 5,, on Eliquis > transitioned to heparin 2/2 CP     Maintaining sinus here  4. CP     Long hx of CAD     Cardiology team is on board and planned for stress testing tomorrow        { For questions or updates, please contact [facility] Please consult [email or web address] for contact info under    Signed, Sheilah Pigeon, PA-C  09/01/2020 2:06 PM;t  I have seen and examined this patient with Francis Dowse.  Agree with above, note added to reflect my findings.  On exam, RRR, no murmurs.  Patient presented to hospital initially with right lower extremity duskiness.  She is status post vascular surgery.  Unfortunately she had significant  issues with wound healing of her great toe and this has since been amputated.  She developed chest tightness and cardiology was consulted.  A stress test has been ordered and she was switched from Eliquis to heparin.  She does have a history of bradycardia and has a Medical sales representative, but it has reached RRT.  She does have instances of a sinus bradycardia down into the 40s.  She does complain of dizziness, though her dizziness appears to be orthostatic when she sits up in her wheelchair.  Currently, with her ongoing issues, we Jacek Colson hold off on pacemaker implant.  She Qunisha Bryk be arranged for follow-up in EP clinic.  I Lorrie Strauch  discuss this with her primary EP.  Makoto Sellitto M. Rontae Inglett MD 09/01/2020 5:36 PM

## 2020-09-01 NOTE — Progress Notes (Signed)
Carotid artery duplex completed. Refer to "CV Proc" under chart review to view preliminary results.  09/01/2020 3:01 PM Eula Fried., MHA, RVT, RDCS, RDMS

## 2020-09-01 NOTE — Consult Note (Addendum)
Cardiology Consultation:   Patient ID: [name] MRN: [id number]; DOB: November 17, 1955  Admit date: 08/23/2020 Date of Consult: 09/01/2020  Primary Care Provider: Binnie Rail, MD [facility] Cardiologist: Kirk Ruths, MD  [place] Electrophysiologist:  Thompson Grayer, MD    Patient Profile:   Mercedes [name] is a 65 y.o. 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 Oneida Alar.  Pt believes this is related to her pacemaker needing change out.   History of Present Illness:   Mercedes [name] with above hx, and CABG 2017 when presented with NSTEMI 40-50% distal LMCA haziness and 70-80% ostial LAD stenosis.  HadCABG on 07/17/2016 with LIMA-LAD, SVG-OM, and SVG-dRCA).  Prior to 2017 she had CAD with hx of stents.  She has done well since CABG with no nuc studies performed and no echo.    She has hx of PAF and in 2016 with tachy-brady syndrome she had presyncope and pauses of > 6 sec.  At other times a fib with HR >140 .   she had RV leadless pacemaker implantation - A [employer] Nanostim Leadless Cardiac Pacemaker model [id number] ([place] [id number]).  Since that time device has reached RRT and pt has not wished to have replaced.  Last visit with Dr. Rayann Heman 05/2020.  She is on eliquis for elevated CHA2DS2VASc score.  Has maintained SR on low dose BB.   She has significant PAD with prior Lt fem pop bypass and thrombectomy left fem-pop bypass patch angioplasty proximal left  femoral popliteal and common femoral artery and thrombectomy left external iliac artery, profunda, superficial femoral artery in September 2020 then Lt transmetatarsal amputation 06/2019, but due to graft failure has undergone AKA of Lt leg.  She has not yet had prosthetic.  She did stop tobacco.     Pt presented with rt foot dusky and ABI of 0.3.  With hx of amputation of left leg aortogram was planned and  Significant Rt leg disease found and she was scheduled for Rt common femoral endarterectomy and rt fem to peroneal artery bypass with graft as vein has been harvested for coronary artery bypass grafting in the past.  She underwent Rt common fem. profundofemoral and SFA endarterectomy and Rt common fem to below knee popliteal artery bypass with 6 mm ringed PTFE.    She also has gangrene of Rt first toe.  08/31/20 she underwent removal of the entire proximal phalanx and also resected the metatarsal head.  She tolerated both surgeries without issue.    Today she complained of ant. Chest tightness for several days and lightheadedness.  No SOB.  Her Eliquis has been resumed.  She is also on asa and plavix.  The tightness began last pm and it is still present.  But mild.    EKG:  The EKG was personally reviewed and demonstrates:  SB at 71, otherwise normal EKG no change from 05/31/20 except HR is now slower Telemetry:  Telemetry was personally reviewed and demonstrates:  SB to 48  Hs troponin  5 Na 133, K+ 4.0 Cr 0.73 LFTs wnl Hgb 10.1 up from 9.7 plts 472   BP 130/71 p 50s  Afebrile and resp 16-20   Past Medical History:  Diagnosis Date  . Abnormal LFTs    a. in the past when on Lipitor  . Anemia    as a young woman  . Anxiety   . Arnold-Chiari malformation, type I (HCC)    MRI brain 02/2010  . CAD (coronary artery disease)    a. NSTEMI 07/2009: LHC - D2 40%, LAD irreg., EF 50% with apical AK (tako-tsubo CM);  b. Inf STEMI (04/2013): Tx Promus DES to CFX;  c. 08/2013 Lexi CL: No ischemia, dist  ant/ap/inf-ap infarct, EF  d. 06/2016 NSTEMI with LM disease: s/p CABG on 07/17/2016 w/ LIMA-LAD, SVG-OM, and SVG-dRCA.  [name] DEPRESSION   . Dizziness    ? CVA 01/2010 - carotid dopplers with no ICA stenosis  . GERD (gastroesophageal reflux disease)   . Headache   . 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 HYPERLIPIDEMIA   . HYPERTENSION   . MI (myocardial infarction) (HCC)   . PAD (peripheral artery disease) (HCC)    s/p L fem pop 11/2013 in setting of 1st toe osteo/gangrene  . Paroxysmal atrial fibrillation (HCC)    a. anticoagulated with Eliquis 10/2014  . 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 RA (rheumatoid arthritis) (HCC)    prior tx by Dr. Dareen Piano  . Sjogren's syndrome (HCC) 06/02/13   pt denies this (12/01/13)  . SLE (systemic lupus erythematosus) (HCC) dx 05/2013   follows with rheum Dareen Piano)  . Tachy-brady syndrome (HCC)    a. s/p STJ Leadless pacemaker 11-04-2014 Dr Johney Frame  . 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 Wears dentures   . Wears glasses     Past Surgical History:  Procedure Laterality Date  . ABDOMINAL AORTAGRAM N/A 11/24/2013   Procedure: ABDOMINAL AORTAGRAM WITH BILATERAL RUNOFF WITH POSSIBLE INTERVENTION;  Surgeon: Chuck Hint, MD;  Location: [facility] CATH LAB;  Service: Cardiovascular;  Laterality: N/A;  . ABDOMINAL AORTOGRAM W/LOWER EXTREMITY N/A 04/25/2019   Procedure: ABDOMINAL AORTOGRAM W/LOWER EXTREMITY;  Surgeon: Sherren Kerns, MD;  Location: [facility] INVASIVE CV LAB;  Service: Cardiovascular;  Laterality: N/A;  . ABDOMINAL AORTOGRAM W/LOWER EXTREMITY Bilateral 05/15/2019   Procedure: ABDOMINAL AORTOGRAM W/LOWER EXTREMITY;  Surgeon: Nada Libman, MD;  Location: [facility] INVASIVE CV LAB;  Service: Cardiovascular;  Laterality: Bilateral;  . ABDOMINAL AORTOGRAM W/LOWER EXTREMITY N/A 08/23/2020   Procedure: ABDOMINAL  AORTOGRAM W/LOWER EXTREMITY;  Surgeon: Maeola Harman, MD;  Location: [facility] INVASIVE CV LAB;  Service: Cardiovascular;  Laterality: N/A;  . AMPUTATION Left 12/02/2013   Procedure: LEFT 1ST TOE AMPUTATION;  Surgeon: Sherren Kerns, MD;  Location: [facility] OR;  Service: Vascular;  Laterality: Left;  . AMPUTATION Left 07/16/2019   Procedure: LEFT TRANSMETATARSAL AMPUTATION;  Surgeon: Nadara Mustard, MD;  Location: [facility] OR;  Service: Orthopedics;  Laterality: Left;  . AMPUTATION Left 10/01/2019   Procedure: LEFT AMPUTATION ABOVE KNEE;  Surgeon: Sherren Kerns, MD;  Location: [facility] OR;  Service: Vascular;  Laterality: Left;  . AMPUTATION Right 08/31/2020   Procedure: Right first toe amputation;  Surgeon: Sherren Kerns, MD;  Location: [facility] OR;  Service: Vascular;  Laterality: Right;  . APPLICATION OF WOUND VAC Left 11/03/2019   Procedure: Application Of Wound  Vac, left lower extremity;  Surgeon: Sherren Kerns, MD;  Location: [facility] OR;  Service: Vascular;  Laterality: Left;  . CARDIAC CATHETERIZATION N/A 07/12/2016   Procedure: Left Heart Cath and Coronary Angiography;  Surgeon: Lennette Bihari, MD;  Location: [facility] INVASIVE CV LAB;  Service: Cardiovascular;  Laterality: N/A;  . CARDIAC CATHETERIZATION N/A 07/14/2016   Procedure: Intravascular Ultrasound/IVUS;  Surgeon: Yvonne Kendall, MD;  Location: [facility] INVASIVE CV LAB;  Service: Cardiovascular;  Laterality: N/A;  . COLONOSCOPY    . CORONARY ANGIOPLASTY  04/2013  . CORONARY ARTERY BYPASS GRAFT N/A 07/17/2016   Procedure: CORONARY ARTERY BYPASS GRAFTING (CABG)x3 using right greater saphenous vein harvested endoscopiclly and left internal mammary artery;  Surgeon: Kerin Perna, MD;  Location: [facility] OR;  Service: Open Heart Surgery;  Laterality: N/A;  . ENDARTERECTOMY FEMORAL Left 05/12/2019   Procedure: LEFT Femoral Endarterectomy;  Surgeon: Sherren Kerns, MD;  Location: [facility] OR;  Service: Vascular;  Laterality: Left;  . FEMORAL-POPLITEAL BYPASS GRAFT Left  12/02/2013   Procedure:  LEFT FEMORAL-BELOW KNEE POPLITEAL ARTERY BYPASS GRAFT;  Surgeon: Sherren Kerns, MD;  Location: [facility] OR;  Service: Vascular;  Laterality: Left;  . FEMORAL-POPLITEAL BYPASS GRAFT Left 09/25/2019   Procedure: THROMBECTOMY and PROPATEN BYPASS  FEMORAL TO BELOW KNEE POPLITEAL ARTERY BYPASS GRAFT LEFT LEG;  Surgeon: Larina Earthly, MD;  Location: [facility] OR;  Service: Vascular;  Laterality: Left;  . FEMORAL-POPLITEAL BYPASS GRAFT Right 08/24/2020   Procedure: RIGHT FEMORAL-BELOW KNEE POPLITEAL ARTERY BYPASS USING GORE PROPATEN GRAFT;  Surgeon: Maeola Harman, MD;  Location: [facility] OR;  Service: Vascular;  Laterality: Right;  . LEFT HEART CATHETERIZATION WITH CORONARY ANGIOGRAM N/A 05/19/2013   Procedure: LEFT HEART CATHETERIZATION WITH CORONARY ANGIOGRAM;  Surgeon: Micheline Chapman, MD;  Location: [facility] CATH LAB;  Service: Cardiovascular;  Laterality: N/A;  . LEFT HEART CATHETERIZATION WITH CORONARY ANGIOGRAM N/A 10/28/2014   Procedure: LEFT HEART CATHETERIZATION WITH CORONARY ANGIOGRAM;  Surgeon: Iran Ouch, MD;  Location: [facility] CATH LAB;  Service: Cardiovascular;  Laterality: N/A;  . LOWER EXTREMITY ANGIOGRAM Left 05/12/2019   Procedure: Lower Extremity Angiogram;  Surgeon: Sherren Kerns, MD;  Location: [facility] OR;  Service: Vascular;  Laterality: Left;  . PATCH ANGIOPLASTY Left 05/12/2019   Procedure: Left Femoral Patch Angioplasty USING CADAVERIC SAPHENOUS VEIN;  Surgeon: Sherren Kerns, MD;  Location: [facility] OR;  Service: Vascular;  Laterality: Left;  . PERCUTANEOUS CORONARY STENT INTERVENTION (PCI-S)  05/19/2013   Procedure: PERCUTANEOUS CORONARY STENT INTERVENTION (PCI-S);  Surgeon: Micheline Chapman, MD;  Location: [facility] CATH LAB;  Service: Cardiovascular;;  . PERMANENT PACEMAKER INSERTION N/A 11/04/2014   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  . TEE WITHOUT CARDIOVERSION N/A 07/17/2016   Procedure: TRANSESOPHAGEAL ECHOCARDIOGRAM (TEE);  Surgeon: Kerin Perna, MD;  Location: [facility] OR;  Service: Open Heart Surgery;  Laterality: N/A;  . THROMBECTOMY FEMORAL ARTERY Left 05/12/2019   Procedure: Left Ilio-Femoral Artery and Femoral-popliteal Graft Thrombectomy;  Surgeon: Sherren Kerns, MD;  Location: [facility] OR;  Service: Vascular;  Laterality: Left;  . TUBAL LIGATION    . VISCERAL ANGIOGRAPHY N/A 04/25/2019   Procedure: MESENTRIC  ANGIOGRAPHY;  Surgeon: Sherren Kerns, MD;  Location: [facility] INVASIVE CV LAB;  Service: Cardiovascular;  Laterality: N/A;  . WOUND DEBRIDEMENT Left 11/03/2019   Procedure: Debridement Wound of left above the knee amputation;  Surgeon: Sherren Kerns, MD;  Location: [facility] OR;  Service: Vascular;  Laterality: Left;     Home  Medications:  Prior to Admission medications   Medication Sig Start Date End Date Taking? Authorizing Provider  acetaminophen (TYLENOL) 500 MG tablet Take 500-1,000 mg by mouth every 8 (eight) hours as needed for mild pain or headache.   Yes [provider]  Ensure Max Protein (ENSURE MAX PROTEIN) LIQD Take 8 oz by mouth daily.   Yes [provider]  metoprolol tartrate (LOPRESSOR) 25 MG tablet Take 0.5 tablets (12.5 mg total) by mouth 2 (two) times daily. 07/18/19  Yes Lewayne Bunting, MD  oxyCODONE-acetaminophen (PERCOCET) 5-325 MG tablet Take 1 tablet by mouth every 6 (six) hours as needed for severe pain. 08/19/20 08/19/21 Yes Lars Mage, PA-C  rosuvastatin (CRESTOR) 40 MG tablet Take 40 mg by mouth at bedtime.   Yes [provider]  ELIQUIS 5 MG TABS tablet TAKE 1 TABLET BY MOUTH TWICE DAILY Patient taking differently: Take 5 mg by mouth 2 (two) times daily. 07/12/20   Allred, Fayrene Fearing, MD  nitroGLYCERIN (NITROSTAT) 0.4 MG SL tablet Place 0.4 mg under the tongue every 4 (four) hours as needed. 06/25/20   [provider]  traMADol (ULTRAM) 50 MG tablet Take 1 tablet (50 mg total) by mouth every 6 (six) hours as needed. Patient not taking: Reported on 08/19/2020  08/16/20   Lars Mage, PA-C    Inpatient Medications: Scheduled Meds: . apixaban  5 mg Oral BID  . aspirin EC  81 mg Oral Daily  . docusate sodium  100 mg Oral Daily  . ezetimibe  10 mg Oral Daily  . metoprolol tartrate  12.5 mg Oral BID  . pantoprazole  40 mg Oral Daily  . Ensure Max Protein  8 oz Oral Daily  . rosuvastatin  40 mg Oral QHS  . sodium chloride flush  3 mL Intravenous Q12H   Continuous Infusions: . sodium chloride    . magnesium sulfate bolus IVPB     PRN Meds: sodium chloride, acetaminophen, alum & mag hydroxide-simeth, guaiFENesin-dextromethorphan, hydrALAZINE, HYDROmorphone (DILAUDID) injection, labetalol, magnesium sulfate bolus IVPB, nitroGLYCERIN, ondansetron (ZOFRAN) IV, oxyCODONE-acetaminophen, phenol, potassium chloride, sodium chloride flush, sodium phosphate  Allergies:    Allergies  Allergen Reactions  . Brilinta [Ticagrelor] Anaphylaxis, Shortness Of Breath and Other (See Comments)    Also, chest tightness  . Latex Rash  . Tape Rash    Social History:   Social History   Socioeconomic History  . Marital status: Single    Spouse name: Not on file  . Number of children: 3  . Years of education: Not on file  . Highest education level: Not on file  Occupational History  . Occupation: retired  Tobacco Use  . Smoking status: Former Smoker    Types: Cigarettes    Quit date: 2019    Years since quitting: 3.0  . Smokeless tobacco: Never Used  . Tobacco comment:    Vaping Use  . Vaping Use: Never used  Substance and Sexual Activity  . Alcohol use: No    Alcohol/week: 0.0 standard drinks  . Drug use: Not Currently    Types: Marijuana, Other-see comments    Comment:  and CBD oil  last use of both was 10/29/19  . Sexual activity: Not on file  Other Topics Concern  . Not on file  Social History Narrative   Lives alone.   Social Determinants of Health   Financial Resource Strain: Low Risk   . Difficulty of Paying Living Expenses: Not  hard at all  Food Insecurity: No  Food Insecurity  . Worried About Programme researcher, broadcasting/film/video in the Last Year: Never true  . Ran Out of Food in the Last Year: Never true  Transportation Needs: No Transportation Needs  . Lack of Transportation (Medical): No  . Lack of Transportation (Non-Medical): No  Physical Activity: Inactive  . Days of Exercise per Week: 0 days  . Minutes of Exercise per Session: 0 min  Stress: No Stress Concern Present  . Feeling of Stress : Not at all  Social Connections: Unknown  . Frequency of Communication with Friends and Family: More than three times a week  . Frequency of Social Gatherings with Friends and Family: Once a week  . Attends Religious Services: Patient refused  . Active Member of Clubs or Organizations: Patient refused  . Attends Banker Meetings: Patient refused  . Marital Status: Never married  Intimate Partner Violence: Not At Risk  . Fear of Current or Ex-Partner: No  . Emotionally Abused: No  . Physically Abused: No  . Sexually Abused: No    Family History:    Family History  Problem Relation Age of Onset  . Arthritis Mother   . Emphysema Mother   . Arthritis Father   . COPD Father   . Heart disease Father   . Diabetes Paternal Aunt        paternal Aunts  . Heart disease Paternal Aunt   . Thyroid disease Neg Hx      ROS:  Please see the history of present illness.  General:no colds or fevers, no weight changes Skin:no rashes or ulcers HEENT:no blurred vision, no congestion CV:see HPI PUL:see HPI GI:no diarrhea constipation or melena, no indigestion GU:no hematuria, no dysuria MS:no joint pain, + claudication Neuro:no syncope, no lightheadedness Endo:no diabetes, no thyroid disease  All other ROS reviewed and negative.     Physical Exam/Data:   Vitals:   08/31/20 2000 08/31/20 2337 09/01/20 0400 09/01/20 0730  BP:  (!) 113/55 126/63 130/71  Pulse:   (!) 53 (!) 54  Resp: 19 15 16 20   Temp:  98 F (36.7 C)  97.8 F (36.6 C) 97.8 F (36.6 C)  TempSrc:  Oral Oral Oral  SpO2:  97% 98% 98%  Weight:      Height:        Intake/Output Summary (Last 24 hours) at 09/01/2020 1332 Last data filed at 09/01/2020 1219 Gross per 24 hour  Intake 480 ml  Output 1600 ml  Net -1120 ml   Last 3 Weights 08/23/2020 05/31/2020 01/15/2020  Weight (lbs) 89 lb 98 lb 3.2 oz 95 lb  Weight (kg) 40.37 kg 44.543 kg 43.092 kg     Body mass index is 14.81 kg/m.  General:  Thin female, in no acute distress HEENT: normal Lymph: no adenopathy Neck: no JVD Endocrine:  No thryomegaly Vascular: No carotid bruits; Lt AKA and Rt foot bandaged and Rt gt toe amputation.  Cardiac:  normal S1, S2; RRR; no murmur gallup or rub  Lungs:  clear to auscultation bilaterally, no wheezing, rhonchi or rales  Abd: soft, nontender, no hepatomegaly  Ext: no edema Musculoskeletal:   BUE strength, Lt AKA, Rt foot dressed post Rt gt toe amputation.  Skin: warm and dry  Neuro: alert and oriented X 3 MAE follows commands, no focal abnormalities noted Psych:  Normal affect   Relevant CV Studies: Echo TEE OR 07/17/16 Left ventricle: Normal cavity size. Concentric hypertrophy of mild  severity. LV systolic function is normal with  an EF of 60-65%. There are  no obvious wall motion abnormalities. No thrombus present. No mass  present.   Aortic valve: No AV vegetation.   Mitral valve: No leaflet thickening and calcification present. Trace  regurgitation.   Right ventricle: Normal cavity size, wall thickness and ejection  fraction.   Tricuspid valve: Trace regurgitation. The tricuspid valve regurgitation  jet is central.   Pulmonic valve: Trace regurgitation.   Laboratory Data:  High Sensitivity Troponin:   Recent Labs  Lab 09/01/20 0829  TROPONINIHS 5     Chemistry Recent Labs  Lab 09/01/20 0829  NA 133*  K 4.0  CL 99  CO2 25  GLUCOSE 103*  BUN 18  CREATININE 0.73  CALCIUM 9.0  GFRNONAA >60  ANIONGAP 9    Recent  Labs  Lab 09/01/20 0829  PROT 6.7  ALBUMIN 2.4*  AST 20  ALT 15  ALKPHOS 74  BILITOT 0.5   Hematology Recent Labs  Lab 08/27/20 0056 09/01/20 0829  WBC 6.3 10.4  RBC 3.99 4.17  HGB 9.7* 10.1*  HCT 32.0* 33.2*  MCV 80.2 79.6*  MCH 24.3* 24.2*  MCHC 30.3 30.4  RDW 16.3* 15.8*  PLT 298 472*   BNPNo results for input(s): BNP, PROBNP in the last 168 hours.  DDimer No results for input(s): DDIMER in the last 168 hours.   Radiology/Studies:  DG HIP UNILAT WITH PELVIS 2-3 VIEWS LEFT  Result Date: 08/30/2020 CLINICAL DATA:  Pain EXAM: DG HIP (WITH OR WITHOUT PELVIS) 2-3V LEFT COMPARISON:  None. FINDINGS: Frontal pelvis as well as frontal and lateral left hip images were obtained. There is no appreciable fracture or dislocation. Joint spaces appear normal. No erosive change. There is bony overgrowth along the superolateral left acetabulum. There are scattered foci of arterial vascular calcification. IMPRESSION: Bony overgrowth along the superolateral left acetabulum potentially places patient at increased risk for femoroacetabular impingement. No fracture or dislocation. No appreciable joint space narrowing. Foci of atherosclerotic arterial vascular calcification noted. Electronically Signed   By: Bretta Bang III M.D.   On: 08/30/2020 08:23     Assessment and Plan:   1. Chest pain with known CAD and CABG 2017 and has done well.  This tightness may remind her of angina prior to CABG.  But reassuring the EKG without acute changes and troponin neg.   Her HR is not severely low but she has not wished to have PPM replaced.   Will check echo and perhaps nuc study.  Dr. Allyson Sabal to see.  2. Tachy-brady syndrome with hx of leadless PPM with RRT and maybe not at all.  Pt in past has not wished for replacement. She would like to proceed at this time, before admit she did have some lightheadedness and that is when she decided she wanted to change PPM.  HR to low of 47 -she is unsure of  lightheadedness with pain pills now.   She is on lopressor 12.5 BID her home dose, once PPM interrogated and EP has decided their plan we may need to hold but concern for her PAF.  3. PAF maintaining SR to SB with PACs back on eliquis.  4. CAD with hx of CABG see #1.  5. HTN controlled on current meds.  6. PAD with hx of failure of Lt fem pop bypass and Lt AKA and now with Rt surgery and Rt fem to BK pop bypass with PTFF POD 8 and POD gt toe amputation on Rt.  7. HLD on statin.  Continue crestor 8. Tobacco use, has stopped.   9. Rt carotid 1-39% rt ICA stenosis and Lt ICA of 40-59% last evaluated 2018       HEAR Score (for undifferentiated chest pain):    6      CHA2DS2-VASc Score = 4  This indicates a 4.8% annual risk of stroke. The patient's score is based upon: CHF History: No HTN History: Yes Diabetes History: No Stroke History: No Vascular Disease History: Yes Age Score: 1 Gender Score: 1         For questions or updates, please contact [facility] Please consult [email or web address] for contact info under    Signed, Nada Boozer, NP  09/01/2020 1:32 PM  Agree with note written by Nada Boozer RNP  We are asked to see Ms. Kasel for chest pain.  She is a vasculopath.  She has had CABG in 2017 without appropriate follow-up since that time.  She has had a left AKA and a recent right femoropopliteal bypass graft with toe amputation performed yesterday.  Her other problems include PAF on Eliquis, hypertension hyperlipidemia and discontinued tobacco abuse.  She apparently had chest pain off and on all night last night.  She is currently pain-free.  Her exam is benign except for right carotid bruit.  Her EKG shows sinus rhythm without acute changes.  Enzymes are low and flat.  I am concerned that this may be angina.  We will discontinue her Eliquis, begin IV heparin.  Will obtain a Lexiscan Myoview stress test as well as carotid Doppler and 2D echo.  She also has a nonfunctioning  leadless pacemaker with heart rates in the 40s and 50s which she is symptomatic from.  Dr. Johney Frame is aware and he will address this.  Nanetta Batty 09/01/2020 1:53 PM

## 2020-09-01 NOTE — Progress Notes (Signed)
Mobility Specialist - Progress Note   09/01/20 1405  Mobility  Activity Contraindicated/medical hold   Differing mobility today as per PTA note, pt is to hold weight bearing until tomorrow.   Mamie Levers Mobility Specialist Mobility Specialist Phone: [phone]

## 2020-09-01 NOTE — Progress Notes (Signed)
ANTICOAGULATION CONSULT NOTE - Initial Consult  Pharmacy Consult for Heparin (Apxiaban on hold) Indication: atrial fibrillation  Allergies  Allergen Reactions  . Brilinta [Ticagrelor] Anaphylaxis, Shortness Of Breath and Other (See Comments)    Also, chest tightness  . Latex Rash  . Tape Rash    Patient Measurements: Height: 5\' 5"  (165.1 cm) Weight: 40.4 kg (89 lb) IBW/kg (Calculated) : 57 Heparin Dosing Weight: 40.4 kg  Vital Signs: Temp: 97.8 F (36.6 C) (01/05 0730) Temp Source: Oral (01/05 0730) BP: 130/71 (01/05 0730) Pulse Rate: 54 (01/05 0730)  Labs: Recent Labs    09/01/20 0829  HGB 10.1*  HCT 33.2*  PLT 472*  CREATININE 0.73  TROPONINIHS 5    Estimated Creatinine Clearance: 44.1 mL/min (by C-G formula based on SCr of 0.73 mg/dL).   Medical History: Past Medical History:  Diagnosis Date  . Abnormal LFTs    a. in the past when on Lipitor  . Anemia    as a young woman  . Anxiety   . Arnold-Chiari malformation, type I (HCC)    MRI brain 02/2010  . CAD (coronary artery disease)    a. NSTEMI 07/2009: LHC - D2 40%, LAD irreg., EF 50% with apical AK (tako-tsubo CM);  b. Inf STEMI (04/2013): Tx Promus DES to CFX;  c. 08/2013 Lexi CL: No ischemia, dist ant/ap/inf-ap infarct, EF  d. 06/2016 NSTEMI with LM disease: s/p CABG on 07/17/2016 w/ LIMA-LAD, SVG-OM, and SVG-dRCA.  07/19/2016 DEPRESSION   . Dizziness    ? CVA 01/2010 - carotid dopplers with no ICA stenosis  . GERD (gastroesophageal reflux disease)   . Headache   . 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 HYPERLIPIDEMIA   . HYPERTENSION   . MI (myocardial infarction) (HCC)   . PAD (peripheral artery disease) (HCC)    s/p L fem pop 11/2013 in setting of 1st toe osteo/gangrene  . Paroxysmal atrial fibrillation (HCC)    a. anticoagulated with Eliquis 10/2014  . 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 RA (rheumatoid arthritis) (HCC)    prior  tx by Dr. 11/2014  . Sjogren's syndrome (HCC) 06/02/13   pt denies this (12/01/13)  . SLE (systemic lupus erythematosus) (HCC) dx 05/2013   follows with rheum 06/2013)  . Tachy-brady syndrome (HCC)    a. s/p STJ Leadless pacemaker 11-04-2014 Dr 01-04-2015  . 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 Wears dentures   . Wears glasses    Assessment:  65 yr old female on Apixaban 5 mg BID PTA for atrial fibrillation.  Held for vascular surgery on 12/28 > resumed 12/29 and given thru 1/1 pm, then held again for R 1st toe amputation on 1/4.  Resumed this morning but now to hold again and transition to IV heparin.  Cardiology consulted for intermittent chest pain, concern for angina. Plan Lexiscan myoview stress test, carotid dopplers and 2D echo.    Last Apixaban dose ~9am today.  Will plan to begin IV heparin ~12 hours later, and use aPTTs to monitor dosing as heparin levels are expected to be falsely elevated from recent Apixaban doses.  Goal of Therapy:  Heparin level 0.3-0.7 units/ml aPTT 66-102 seconds Monitor platelets by anticoagulation protocol: Yes   Plan:   Begin Heparin drip at 9pm tonight at 550 units/hr (~14 units/kg/hr)  aPTT and heparin level ~8 hrs after drip begins.  Daily aPTT, heparin level and CBC.   Apixaban on hold.  Follow up studies and anticoagulation plans.  Dennie Fetters, RPh 09/01/2020,2:44 PM

## 2020-09-01 NOTE — Progress Notes (Addendum)
Spoke with Dr.Hawkins from vascular team who reports weight bearing on R heel in post op shoe for bed to chair transfers only until L prosthesis can be utilized.    Addendum: Wendi Maya PA reports heel weight bearing in post op shoe starting tomorrow so will defer PT tx today and re-eval tomorrow.   Quentin Angst Acute Rehabilitation Services Pager [phone] Office [phone]

## 2020-09-01 NOTE — Progress Notes (Addendum)
Occupational Therapy Treatment Patient Details Name: Mercedes [name] MRN: [id number] DOB: 11-29-1955 Today's Date: 09/01/2020    History of present illness 65 year old female with L above-knee amputation in remote history.  Underwent Right common femoral, profundofemoral and SFA endarterectomy and Right common femoral to below-knee popliteal artery bypass 12/28. Pt Now s/p amputation right first toe with resection of metatarsal head on 08/31/20.   OT comments  Pt progressing towards acute OT goals. Focus of session was simulated toilet transfer (EOB to recliner). Pt currently min A for pivotal steps utilizing rw. First time up since surgery yesterday. 6/10 pain level R groin and R foot. Up in recliner with RLE elevated on pillows. D/c plan remains appropriate.    Follow Up Recommendations  CIR    Equipment Recommendations  None recommended by OT    Recommendations for Other Services      Precautions / Restrictions Precautions Precautions: Fall Precaution Comments: use prosthetic shrinker to help with L LE swelling Restrictions Weight Bearing Restrictions: Yes Other Position/Activity Restrictions: No order set addressing WB status. Assuming WBAT with R postop shoe on.       Mobility Bed Mobility Overal bed mobility: Needs Assistance Bed Mobility: Supine to Sit     Supine to sit: Supervision     General bed mobility comments: Supervision for safety  Transfers Overall transfer level: Needs assistance Equipment used: Rolling walker (2 wheeled) Transfers: Sit to/from Stand;Stand Pivot Transfers Sit to Stand: Min assist;From elevated surface Stand pivot transfers: Min assist;From elevated surface       General transfer comment: min A to steady. from elevated EOB height to recliner. Utilized postop shoe and rw.    Balance Overall balance assessment: Needs assistance Sitting-balance support: No upper extremity supported Sitting balance-Leahy Scale: Good     Standing  balance support: Bilateral upper extremity supported;Single extremity supported;During functional activity Standing balance-Leahy Scale: Poor Standing balance comment: rw and min A to steady. first time up since surgery                           ADL either performed or assessed with clinical judgement   ADL Overall ADL's : Needs assistance/impaired                         Toilet Transfer: Minimal assistance;Stand-pivot;BSC;RW Toilet Transfer Details (indicate cue type and reason): simulated with EOB to recliner. min A to steady. First time up since surgery.           General ADL Comments: Pt completed bed mobility, sat EOB a couple of minutes then took pivotal steps to sit up in recliner. min A to steady during transfers.     Vision       Perception     Praxis      Cognition Arousal/Alertness: Awake/alert Behavior During Therapy: WFL for tasks assessed/performed Overall Cognitive Status: Within Functional Limits for tasks assessed                                          Exercises     Shoulder Instructions       General Comments  Spoke with RN prior to session regarding medical appropriateness to see today as RN yesterday advised holding on mobility. RN felt pt medically appropriate to see today.     Pertinent Vitals/ Pain  Pain Assessment: 0-10 Pain Score: 6  Pain Location: R groin and R foot Pain Descriptors / Indicators: Burning;Sore Pain Intervention(s): Limited activity within patient's tolerance;Monitored during session;Repositioned  Home Living                                          Prior Functioning/Environment              Frequency  Min 2X/week        Progress Toward Goals  OT Goals(current goals can now be found in the care plan section)  Progress towards OT goals: Progressing toward goals  Acute Rehab OT Goals Patient Stated Goal: Try to get home and start waking  again. OT Goal Formulation: With patient Time For Goal Achievement: 09/08/20 Potential to Achieve Goals: Good ADL Goals Pt Will Perform Grooming: with set-up;sitting Pt Will Perform Lower Body Bathing: with supervision;sitting/lateral leans Pt Will Perform Lower Body Dressing: with supervision;sitting/lateral leans Pt Will Transfer to Toilet: with min assist;bedside commode;stand pivot transfer Pt Will Perform Toileting - Clothing Manipulation and hygiene: with min guard assist;sitting/lateral leans  Plan Discharge plan remains appropriate    Co-evaluation                 AM-PAC OT "6 Clicks" Daily Activity     Outcome Measure   Help from another person eating meals?: None Help from another person taking care of personal grooming?: None Help from another person toileting, which includes using toliet, bedpan, or urinal?: A Lot Help from another person bathing (including washing, rinsing, drying)?: A Lot Help from another person to put on and taking off regular upper body clothing?: A Little Help from another person to put on and taking off regular lower body clothing?: A Lot 6 Click Score: 17    End of Session Equipment Utilized During Treatment: Rolling walker;Other (comment) (R postop shoe)  OT Visit Diagnosis: Pain Pain - Right/Left: Right Pain - part of body: Leg   Activity Tolerance Patient tolerated treatment well   Patient Left in chair;with call bell/phone within reach   Nurse Communication          Time: 7209-4709 OT Time Calculation (min): 22 min  Charges: OT General Charges $OT Visit: 1 Visit OT Treatments $Self Care/Home Management : 8-22 mins  Raynald Kemp, OT Acute Rehabilitation Services Pager: [phone] Office: [phone]    Pilar Grammes 09/01/2020, 12:48 PM

## 2020-09-02 ENCOUNTER — Inpatient Hospital Stay (HOSPITAL_COMMUNITY): Payer: Medicare Other

## 2020-09-02 DIAGNOSIS — R079 Chest pain, unspecified: Secondary | ICD-10-CM

## 2020-09-02 DIAGNOSIS — I361 Nonrheumatic tricuspid (valve) insufficiency: Secondary | ICD-10-CM | POA: Diagnosis not present

## 2020-09-02 LAB — CBC
HCT: 32.1 % — ABNORMAL LOW (ref 36.0–46.0)
Hemoglobin: 9.9 g/dL — ABNORMAL LOW (ref 12.0–15.0)
MCH: 24.8 pg — ABNORMAL LOW (ref 26.0–34.0)
MCHC: 30.8 g/dL (ref 30.0–36.0)
MCV: 80.3 fL (ref 80.0–100.0)
Platelets: 498 10*3/uL — ABNORMAL HIGH (ref 150–400)
RBC: 4 MIL/uL (ref 3.87–5.11)
RDW: 15.9 % — ABNORMAL HIGH (ref 11.5–15.5)
WBC: 12.3 10*3/uL — ABNORMAL HIGH (ref 4.0–10.5)
nRBC: 0 % (ref 0.0–0.2)

## 2020-09-02 LAB — ECHOCARDIOGRAM COMPLETE
Area-P 1/2: 3.53 cm2
Height: 65 in
S' Lateral: 3 cm
Weight: 1424 oz

## 2020-09-02 LAB — NM MYOCAR MULTI W/SPECT W/WALL MOTION / EF
Estimated workload: 1 METS
Exercise duration (min): 0 min
Exercise duration (sec): 0 s
LV dias vol: 78 mL (ref 46–106)
LV sys vol: 160 mL
MPHR: 154 {beats}/min
Peak HR: 80 {beats}/min
Percent HR: 51 %
Rest HR: 53 {beats}/min
TID: 1.06

## 2020-09-02 LAB — HEPARIN LEVEL (UNFRACTIONATED): Heparin Unfractionated: 0.34 IU/mL (ref 0.30–0.70)

## 2020-09-02 LAB — APTT: aPTT: 35 seconds (ref 24–36)

## 2020-09-02 MED ORDER — ISOSORBIDE MONONITRATE ER 30 MG PO TB24
30.0000 mg | ORAL_TABLET | Freq: Every day | ORAL | Status: DC
  Administered 2020-09-02 – 2020-09-08 (×7): 30 mg via ORAL
  Filled 2020-09-02 (×7): qty 1

## 2020-09-02 MED ORDER — TECHNETIUM TC 99M TETROFOSMIN IV KIT
30.7000 | PACK | Freq: Once | INTRAVENOUS | Status: AC | PRN
  Administered 2020-09-02: 30.7 via INTRAVENOUS

## 2020-09-02 MED ORDER — APIXABAN 5 MG PO TABS
5.0000 mg | ORAL_TABLET | Freq: Two times a day (BID) | ORAL | Status: DC
  Administered 2020-09-02 – 2020-09-08 (×12): 5 mg via ORAL
  Filled 2020-09-02 (×12): qty 1

## 2020-09-02 MED ORDER — TECHNETIUM TC 99M TETROFOSMIN IV KIT
10.1000 | PACK | Freq: Once | INTRAVENOUS | Status: AC | PRN
  Administered 2020-09-02: 10.1 via INTRAVENOUS

## 2020-09-02 MED ORDER — REGADENOSON 0.4 MG/5ML IV SOLN
0.4000 mg | Freq: Once | INTRAVENOUS | Status: AC
  Administered 2020-09-02: 0.4 mg via INTRAVENOUS
  Filled 2020-09-02: qty 5

## 2020-09-02 MED ORDER — REGADENOSON 0.4 MG/5ML IV SOLN
INTRAVENOUS | Status: AC
  Filled 2020-09-02: qty 5

## 2020-09-02 NOTE — Progress Notes (Signed)
PT Cancellation Note  Patient Details Name: Mercedes [name] MRN: [id number] DOB: 11-Feb-1956   Cancelled Treatment:    Reason Eval/Treat Not Completed: Patient at procedure or test/unavailable (Pt in Nuclear Med for stress test.)   [name] 09/02/2020, 10:03 AM  [name],PT Acute Rehabilitation Services Pager:  [phone]  Office:  [phone]

## 2020-09-02 NOTE — Progress Notes (Signed)
ANTICOAGULATION CONSULT NOTE - Initial Consult  Pharmacy Consult for Heparin (Apxiaban on hold) Indication: atrial fibrillation  Allergies  Allergen Reactions  . Brilinta [Ticagrelor] Anaphylaxis, Shortness Of Breath and Other (See Comments)    Also, chest tightness  . Latex Rash  . Tape Rash    Patient Measurements: Height: 5\' 5"  (165.1 cm) Weight: 40.4 kg (89 lb) IBW/kg (Calculated) : 57 Heparin Dosing Weight: 40.4 kg  Vital Signs: Temp: 97.7 F (36.5 C) (01/06 0434) Temp Source: Oral (01/06 0434) BP: 140/84 (01/06 0434) Pulse Rate: 62 (01/06 0434)  Labs: Recent Labs    09/01/20 0829 09/02/20 0554  HGB 10.1* 9.9*  HCT 33.2* 32.1*  PLT 472* 498*  APTT  --  35  HEPARINUNFRC  --  0.34  CREATININE 0.73  --   TROPONINIHS 5  --     Estimated Creatinine Clearance: 44.1 mL/min (by C-G formula based on SCr of 0.73 mg/dL).  Assessment:  65 yr old female on Apixaban 5 mg BID PTA for atrial fibrillation.  Held for vascular surgery on 12/28 > resumed 12/29 and given thru 1/1 pm, then held again for R 1st toe amputation on 1/4.  Resumed 1/5 x 1 dose but now to hold again and transition to IV heparin.  Cardiology consulted for intermittent chest pain, concern for angina. Plan Lexiscan myoview stress test, carotid dopplers and 2D echo.  Hep lvl ths am 0.34, aptt 35 - can follow hep lvl   Cbc low but stable  Goal of Therapy:  Heparin level 0.3-0.7 units/ml Monitor platelets by anticoagulation protocol: Yes   Plan:  Continue heparin 550 units/hr Daily hep lvl cbc F/u plans to return to apixaban  3/4, PharmD, BCPS, BCCCP Clinical Pharmacist  Please check [facility]ION for all [name] Pharm[facility] numbers  09/02/2020 7:36 AM

## 2020-09-02 NOTE — Care Management Important Message (Signed)
Important Message  Patient Details  Name: Mercedes [name] MRN: [id number] Date of Birth: September 03, 1955   Medicare Important Message Given:  Yes     [name] 09/02/2020, 11:43 AM

## 2020-09-02 NOTE — Progress Notes (Signed)
   [name] presented for a nuclear stress test today.  No immediate complications.  Stress imaging is pending at this time.  Preliminary EKG findings may be listed in the chart, but the stress test result will not be finalized until perfusion imaging is complete.  Beatriz Stallion, PA-C 09/02/2020, 10:37 AM

## 2020-09-02 NOTE — Evaluation (Addendum)
Physical Therapy Re-Evaluation Patient Details Name: Mercedes [name] MRN: [id number] DOB: 07/10/1956 Today's Date: 09/02/2020   History of Present Illness  65-year-old female with L above-knee amputation in remote history.  Underwent Right common femoral, profundofemoral and SFA endarterectomy and Right common femoral to below-knee popliteal artery bypass 12/28. Now s/p Amputation right first toe with resection of metatarsal head on 08/31/20.  Cardiology to work patient up due to c/o chest pain and tightness with possible pacemaker surgery if indicated.  Clinical Impression  Pt admitted with above diagnosis. Pt was able to sit EOB and with mod assist was able to put on prosthesis.  Initially thought it wasn't going to fit however did get it in place.  Raised bed considerably to assist pt with sit to stand and pt was able to stand to her feet with pt bearing weight on right heel and needing assist with all aspects of the prosthesis from sit to stand to stand to sit.  Pt was able to step her left LE around to the chair.  Did talk to nurse to order Darco shoe as Dr. Fields did have that in his note.  Used cast shoe today since pivoting.  Feel that pt is excellent rehab candidate. Pt to call family to bring in her shrinker as her left residual limb is swollen. Pt met 1/3 goals.  Revised goals and added some goals as well.   Will continue to progress. Pt currently with functional limitations due to thbalance and enduranc deficits. Pt will benefit from skilled PT to increase their independence and safety with mobility to allow discharge to the venue listed below.      Follow Up Recommendations CIR    Equipment Recommendations  Rolling walker with 5" wheels;Wheelchair (measurements PT);Wheelchair cushion (measurements PT)    Recommendations for Other Services Rehab consult     Precautions / Restrictions Precautions Precautions: Fall Precaution Comments: use prosthetic shrinker to help with L LE  swelling Required Braces or Orthoses: Other Brace (left prosthetic in room) Other Brace: Darco shoe for right foot with heel weight beraing Restrictions Weight Bearing Restrictions: Yes RLE Weight Bearing:  (heel weight bearing)      Mobility  Bed Mobility Overal bed mobility: Needs Assistance Bed Mobility: Supine to Sit Rolling: Supervision   Supine to sit: Supervision Sit to supine: Supervision   General bed mobility comments: Supervision for safety    Transfers Overall transfer level: Needs assistance Equipment used: Rolling walker (2 wheeled) Transfers: Sit to/from Stand;Stand Pivot Transfers Sit to Stand: From elevated surface;Mod assist Stand pivot transfers: Min assist;From elevated surface;Mod assist       General transfer comment: mod assist to come to stand with weight bearing on right heel only (used cast shoe because Darco shoe was not brought in yet) and using prosthesis on left as pt had to get prosthesis fastened and extended in place and needed PT assist to do so. Once up, was able to pivot to chair with RW and min assist to steady.  Pt with flexed upperbody as well. Elevated bed  height to assist with sit to stand.  Ambulation/Gait                Stairs            Wheelchair Mobility    Modified Rankin (Stroke Patients Only)       Balance Overall balance assessment: Needs assistance Sitting-balance support: No upper extremity supported Sitting balance-Leahy Scale: Good     Standing balance   support: Bilateral upper extremity supported;Single extremity supported;During functional activity Standing balance-Leahy Scale: Poor Standing balance comment: rw and min A to steady. first time up since surgery                             Pertinent Vitals/Pain Pain Assessment: Faces Faces Pain Scale: Hurts little more Pain Location: R groin and R foot Pain Descriptors / Indicators: Burning;Sore Pain Intervention(s): Limited  activity within patient's tolerance;Monitored during session;Repositioned    Home Living                        Prior Function                 Hand Dominance        Extremity/Trunk Assessment                Communication      Cognition Arousal/Alertness: Awake/alert Behavior During Therapy: WFL for tasks assessed/performed Overall Cognitive Status: Within Functional Limits for tasks assessed                                        General Comments General comments (skin integrity, edema, etc.): VSS    Exercises Total Joint Exercises Long Arc Quad: AROM;Right;10 reps;Seated   Assessment/Plan    PT Assessment    PT Problem List         PT Treatment Interventions      PT Goals (Current goals can be found in the Care Plan section)  Acute Rehab PT Goals Patient Stated Goal: Try to get home and start waking again. PT Goal Formulation: With patient Time For Goal Achievement: 09/16/20 Potential to Achieve Goals: Good    Frequency Min 3X/week   Barriers to discharge        Co-evaluation               AM-PAC PT "6 Clicks" Mobility  Outcome Measure Help needed turning from your back to your side while in a flat bed without using bedrails?: A Little Help needed moving from lying on your back to sitting on the side of a flat bed without using bedrails?: A Little Help needed moving to and from a bed to a chair (including a wheelchair)?: A Little Help needed standing up from a chair using your arms (e.g., wheelchair or bedside chair)?: A Lot Help needed to walk in hospital room?: A Lot Help needed climbing 3-5 steps with a railing? : A Lot 6 Click Score: 15    End of Session Equipment Utilized During Treatment: Gait belt Activity Tolerance: Patient tolerated treatment well Patient left: in chair;with call bell/phone within reach;with chair alarm set Nurse Communication: Mobility status PT Visit Diagnosis: Unsteadiness on  feet (R26.81);Other abnormalities of gait and mobility (R26.89);Muscle weakness (generalized) (M62.81);Difficulty in walking, not elsewhere classified (R26.2);Pain    Time: 1205-1240 PT Time Calculation (min) (ACUTE ONLY): 35 min   Charges:   PT Evaluation $PT Re-evaluation: 1 Re-eval PT Treatments $Therapeutic Activity: 8-22 mins      Frankie Zito W,PT Acute Rehabilitation Services Pager:  [phone]  Office:  [place] 09/02/2020, 2:32 PM

## 2020-09-02 NOTE — Progress Notes (Addendum)
Inpatient Rehab Admissions Coordinator:   Pt re-evaluated and appears to have a functional decline.  Attempted to call pt to discuss CIR, but no answer.  Will try again later today or tomorrow.   Estill Dooms, PT, DPT Admissions Coordinator [phone] 09/02/20  3:11 PM

## 2020-09-02 NOTE — Progress Notes (Signed)
Orthopedic Tech Progress Note Patient Details:  Mercedes [name] [birth year] [id number] Left shoe in room Ortho Devices Type of Ortho Device: Darco shoe Ortho Device/Splint Location: RLE Ortho Device/Splint Interventions: Ordered,Adjustment   Post Interventions Patient Tolerated: Well Instructions Provided: Care of device,Adjustment of device,Poper ambulation with device   Hiren Peplinski 09/02/2020, 2:29 PM

## 2020-09-02 NOTE — Progress Notes (Addendum)
Progress Note  Patient Name: Mercedes [name] Date of Encounter: 09/02/2020  [facility] Cardiologist: Olga Millers, MD   Subjective   Feeling okay this morning. Had dressing changed on foot which caused some foot pain. No complaints of recurrent chest pain/tightness, SOB, or palpitations.   Inpatient Medications    Scheduled Meds: . aspirin EC  81 mg Oral Daily  . docusate sodium  100 mg Oral Daily  . ezetimibe  10 mg Oral Daily  . metoprolol tartrate  12.5 mg Oral BID  . pantoprazole  40 mg Oral Daily  . Ensure Max Protein  8 oz Oral Daily  . rosuvastatin  40 mg Oral QHS  . sodium chloride flush  3 mL Intravenous Q12H   Continuous Infusions: . sodium chloride    . heparin 550 Units/hr (09/01/20 2232)  . magnesium sulfate bolus IVPB     PRN Meds: sodium chloride, acetaminophen, alum & mag hydroxide-simeth, guaiFENesin-dextromethorphan, hydrALAZINE, HYDROmorphone (DILAUDID) injection, labetalol, magnesium sulfate bolus IVPB, nitroGLYCERIN, ondansetron (ZOFRAN) IV, oxyCODONE-acetaminophen, phenol, potassium chloride, sodium chloride flush, sodium phosphate   Vital Signs    Vitals:   09/01/20 2010 09/02/20 0040 09/02/20 0434 09/02/20 0811  BP: 128/67 114/74 140/84 137/63  Pulse: 61 (!) 55 62 86  Resp: 14 14 20 19   Temp: 97.7 F (36.5 C) 97.8 F (36.6 C) 97.7 F (36.5 C) 98.4 F (36.9 C)  TempSrc: Oral Oral Oral Oral  SpO2: 99% 100% 100% 90%  Weight:      Height:        Intake/Output Summary (Last 24 hours) at 09/02/2020 0830 Last data filed at 09/01/2020 2335 Gross per 24 hour  Intake 365.72 ml  Output 1100 ml  Net -734.28 ml   Last 3 Weights 08/23/2020 05/31/2020 01/15/2020  Weight (lbs) 89 lb 98 lb 3.2 oz 95 lb  Weight (kg) 40.37 kg 44.543 kg 43.092 kg      Telemetry    Sinus bradycardia on monitor in Nuc Med - Personally Reviewed  ECG    Sinus bradycardia, rate 53 bpm, no STE/D - Personally Reviewed  Physical Exam   GEN: No acute distress.    Neck: No JVD Cardiac: bradycardic, regular rhythm, no murmurs, rubs, or gallops.  Respiratory: Clear to auscultation bilaterally. GI: Soft, nontender, non-distended  MS: No edema; right foot bandaged; s/p L AKA. Neuro:  Nonfocal  Psych: Normal affect   Labs    High Sensitivity Troponin:   Recent Labs  Lab 09/01/20 0829  TROPONINIHS 5      Chemistry Recent Labs  Lab 09/01/20 0829  NA 133*  K 4.0  CL 99  CO2 25  GLUCOSE 103*  BUN 18  CREATININE 0.73  CALCIUM 9.0  PROT 6.7  ALBUMIN 2.4*  AST 20  ALT 15  ALKPHOS 74  BILITOT 0.5  GFRNONAA >60  ANIONGAP 9     Hematology Recent Labs  Lab 08/27/20 0056 09/01/20 0829 09/02/20 0554  WBC 6.3 10.4 12.3*  RBC 3.99 4.17 4.00  HGB 9.7* 10.1* 9.9*  HCT 32.0* 33.2* 32.1*  MCV 80.2 79.6* 80.3  MCH 24.3* 24.2* 24.8*  MCHC 30.3 30.4 30.8  RDW 16.3* 15.8* 15.9*  PLT 298 472* 498*    BNPNo results for input(s): BNP, PROBNP in the last 168 hours.   DDimer No results for input(s): DDIMER in the last 168 hours.   Radiology    VAS US CAROTID  Result Date: 09/01/2020 Carotid Arterial Duplex Study Indications:  Right bruit. Risk Factors:      Hypertension, hyperlipidemia, PAD. Comparison Study:  No prior study Performing Technologist: Maudry Mayhew MHA, RDMS, RVT, RDCS  Examination Guidelines: A complete evaluation includes B-mode imaging, spectral Doppler, color Doppler, and power Doppler as needed of all accessible portions of each vessel. Bilateral testing is considered an integral part of a complete examination. Limited examinations for reoccurring indications may be performed as noted.  Right Carotid Findings: +----------+-------+--------+--------+--------------------------------+--------+           PSV    EDV cm/sStenosisPlaque Description              Comments           cm/s                                                             +----------+-------+--------+--------+--------------------------------+--------+ CCA Prox  105    9                                                        +----------+-------+--------+--------+--------------------------------+--------+ CCA Distal66     17                                                       +----------+-------+--------+--------+--------------------------------+--------+ ICA Prox  52     19              smooth and homogeneous                   +----------+-------+--------+--------+--------------------------------+--------+ ICA Distal116    27                                                       +----------+-------+--------+--------+--------------------------------+--------+ ECA       419    31      >50%    hypoechoic, smooth and                                                    homogeneous                              +----------+-------+--------+--------+--------------------------------+--------+ +----------+--------+-------+----------------+-------------------+           PSV cm/sEDV cmsDescribe        Arm Pressure (mmHG) +----------+--------+-------+----------------+-------------------+ [id number]            Multiphasic, WNL                    +----------+--------+-------+----------------+-------------------+ +---------+--------+--+--------+--+---------+ VertebralPSV cm/s97EDV cm/s13Antegrade +---------+--------+--+--------+--+---------+  Left Carotid Findings: +----------+--------+-------+--------+----------------------+------------------+           PSV cm/sEDV  StenosisPlaque Description    Comments                             cm/s                                                    +----------+--------+-------+--------+----------------------+------------------+ CCA Prox  89      12                                                       +----------+--------+-------+--------+----------------------+------------------+ CCA Distal79      17                                   intimal thickening +----------+--------+-------+--------+----------------------+------------------+ ICA Prox  71      17             irregular and calcific                   +----------+--------+-------+--------+----------------------+------------------+ ICA Distal87      24                                                      +----------+--------+-------+--------+----------------------+------------------+ ECA       103     11             smooth and                                                                heterogenous                             +----------+--------+-------+--------+----------------------+------------------+ +----------+--------+--------+----------------+-------------------+           PSV cm/sEDV cm/sDescribe        Arm Pressure (mmHG) +----------+--------+--------+----------------+-------------------+ [id number]             Multiphasic, WNL                    +----------+--------+--------+----------------+-------------------+ +---------+--------+---+--------+--+---------+ VertebralPSV cm/s101EDV cm/s19Antegrade +---------+--------+---+--------+--+---------+   Summary: Right Carotid: Velocities in the right ICA are consistent with a 1-39% stenosis.                The ECA appears >50% stenosed. Left Carotid: Velocities in the left ICA are consistent with a 1-39% stenosis. Vertebrals:  Bilateral vertebral arteries demonstrate antegrade flow. Subclavians: Normal flow hemodynamics were seen in bilateral subclavian              arteries. *See table(s) above for measurements and observations.     Preliminary     Cardiac Studies   Echo TEE OR 07/17/16 Left ventricle: Normal cavity size. Concentric hypertrophy of  mild  severity. LV systolic function is normal with an EF of 60-65%. There are  no obvious  wall motion abnormalities. No thrombus present. No mass  present.   Aortic valve: No AV vegetation.   Mitral valve: No leaflet thickening and calcification present. Trace  regurgitation.   Right ventricle: Normal cavity size, wall thickness and ejection  fraction.   Tricuspid valve: Trace regurgitation. The tricuspid valve regurgitation  jet is central.   Pulmonic valve: Trace regurgitation.  Carotid Artery Duplex 09/01/20 Right Carotid: Velocities in the right ICA are consistent with a 1-39%  stenosis.         The ECA appears >50% stenosed.   Left Carotid: Velocities in the left ICA are consistent with a 1-39%  stenosis.   Vertebrals: Bilateral vertebral arteries demonstrate antegrade flow.  Subclavians: Normal flow hemodynamics were seen in bilateral subclavian        arteries.    Patient Profile     65 y.o. 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 Post-op course complicated by chest pain for which Cardiology has been consulted.  Assessment & Plan    #Chest Pain #Known multivessel CAD s/p ACB: Patient with episode of chest pain post-operatively that reminded her of her anginal symptoms that she developed prior to ACB. Trop negative. ECG without ischemic changes. The patient, however, is at high risk of recurrent ischemia and therefore was initiated on heparin gtt by Dr. Allyson Sabal yesterday.  Plan for myoview today. -Continue heparin gtt - Await myoview results - low threshold to cath given history of significant CAD -Follow-up TTE -Continue ASA 81mg , rosuvastatin 40mg  and zetia 10mg  daily -Continue metop--change to long-acting prior to discharge  #Tachy-brady syndrome s/p PPM placement--non-functioning as reached RRT at 2020 and patient declined PPM replacement -Dr. consulted and given recent gangrenous toe and no acute indication for pacing at this time; will follow-up with EP as out-patient for further management  #Paroxysmal Afib: CHADS-vasc 5. On eliquis for AC at home that is currently held given patient on heparin gtt as above. -Continue metop 12.5mg  BID -Continue heparin gtt -Transition back to home apixaban on discharge  #PAD: Significant PAD with hx of failure of Lt fem pop bypass and Lt AKA and now with R CFA endarterectomy and femoral to popliteal bypass with PTFE and subsequent R GT amputation on 08/31/20.  -Continue ASA, statin, zetia as above -Vascular surgery following  #Carotid Artery Disease: Right ICA are consistent with a 1-39% stenosis. Left ICA are consistent with a 1-39%  Stenosis on duplex 09/01/20. -Continue ASA, statin and zetia as above         For questions or updates, please contact [facility] Please consult [email or web address] for contact info under        Signed, Estill Dooms, MD  09/02/2020, 8:30 AM    Reviewed 10/30/20. Very limited, poor study but no obvious ischemia. Given negative trop, lack of symptoms and no evidence of ischemia on  stress, will continue aggressive medical therapy at this time. If recurrence of symptoms, can proceed with coronary angiography in the future. Patient prefers this strategy as well and she states she does not have regular chest tightness at home and just had the one episode in the hospital. She would like to defer cath for now unless her symptoms recur. Will transition off heparin gtt and  back to home apixaban.   Laurance Flatten, MD

## 2020-09-02 NOTE — Progress Notes (Signed)
Echocardiogram 2D Echocardiogram has been performed.  Mercedes [name] 09/02/2020, 3:00 PM

## 2020-09-02 NOTE — Progress Notes (Addendum)
  Progress Note    09/02/2020 7:35 AM 2 Days Post-Op  Subjective:  No complaints overnight   Vitals:   09/02/20 0040 09/02/20 0434  BP: 114/74 140/84  Pulse: (!) 55 62  Resp: 14 20  Temp: 97.8 F (36.6 C) 97.7 F (36.5 C)  SpO2: 100% 100%   Physical Exam: Lungs:  Non labored Incisions:  R groin and popliteal incision c/d/i; R GT amp site clean with viable skin edges Extremities:  Palpable R ATA pulse Neurologic: A&O  CBC    Component Value Date/Time   WBC 12.3 (H) 09/02/2020 0554   RBC 4.00 09/02/2020 0554   HGB 9.9 (L) 09/02/2020 0554   HGB 14.2 10/09/2018 1125   HCT 32.1 (L) 09/02/2020 0554   HCT 44.6 10/09/2018 1125   PLT 498 (H) 09/02/2020 0554   PLT 289 10/09/2018 1125   MCV 80.3 09/02/2020 0554   MCV 79 10/09/2018 1125   MCH 24.8 (L) 09/02/2020 0554   MCHC 30.8 09/02/2020 0554   RDW 15.9 (H) 09/02/2020 0554   RDW 14.8 10/09/2018 1125   LYMPHSABS 1.7 08/27/2020 0056   MONOABS 0.4 08/27/2020 0056   EOSABS 0.4 08/27/2020 0056   BASOSABS 0.0 08/27/2020 0056    BMET    Component Value Date/Time   NA 133 (L) 09/01/2020 0829   NA 135 10/09/2018 1125   K 4.0 09/01/2020 0829   CL 99 09/01/2020 0829   CO2 25 09/01/2020 0829   GLUCOSE 103 (H) 09/01/2020 0829   BUN 18 09/01/2020 0829   BUN 10 10/09/2018 1125   CREATININE 0.73 09/01/2020 0829   CREATININE 1.13 (H) 08/03/2016 1440   CALCIUM 9.0 09/01/2020 0829   GFRNONAA >60 09/01/2020 0829   GFRAA >60 11/24/2019 0620    INR    Component Value Date/Time   INR 1.0 11/18/2019 1844     Intake/Output Summary (Last 24 hours) at 09/02/2020 0735 Last data filed at 09/01/2020 2335 Gross per 24 hour  Intake 605.72 ml  Output 1100 ml  Net -494.28 ml     Assessment/Plan:  65 y.o. female is s/p R CFA endarterectomy and femoral to popliteal bypass with PTFE and subsequent R GT amputation 2 Days Post-Op   RLE well perfused with palpable ATA pulse R GT amp site with viable skin edges; continue daily dry  dressing changes R heal weightbearing; Ambulate with darco shoe NPO for stress test per Cardiology this morning EP will follow up as outpatient Carotid duplex negative   Emilie Rutter, PA-C [facility] [phone] 09/02/2020 7:35 AM  Agree with above Stable from vascular surgery standpoint Ok for rehab from my standpoint Cardiology testing in progress  Fabienne Bruns, MD [facility] [place] Office: [phone]

## 2020-09-02 NOTE — Progress Notes (Addendum)
ANTICOAGULATION CONSULT NOTE - Consult  Pharmacy Consult for Heparin >> Apixaban Indication: atrial fibrillation  Allergies  Allergen Reactions  . Brilinta [Ticagrelor] Anaphylaxis, Shortness Of Breath and Other (See Comments)    Also, chest tightness  . Latex Rash  . Tape Rash    Patient Measurements: Height: 5\' 5"  (165.1 cm) Weight: 40.4 kg (89 lb) IBW/kg (Calculated) : 57 Heparin Dosing Weight: 40.4 kg  Vital Signs: Temp: 98 F (36.7 C) (01/06 1545) Temp Source: Oral (01/06 1545) BP: 136/79 (01/06 1545) Pulse Rate: 68 (01/06 1545)  Labs: Recent Labs    09/01/20 0829 09/02/20 0554  HGB 10.1* 9.9*  HCT 33.2* 32.1*  PLT 472* 498*  APTT  --  35  HEPARINUNFRC  --  0.34  CREATININE 0.73  --   TROPONINIHS 5  --     Estimated Creatinine Clearance: 44.1 mL/min (by C-G formula based on SCr of 0.73 mg/dL).  Assessment:  65 yr old female on apixaban 5 mg BID PTA for atrial fibrillation; apixaban was held for vascular surgery on 12/28 > resumed 12/29 and given thru 1/1 pm, then held again for R 1st toe amputation on 1/4.  Apixaban was resumed 1/5 x 1, dose but was on hold again earlier today and pt was transitioned to IV heparin.  Cardiology consulted for intermittent chest pain, concern for angina. Plan Lexiscan myoview stress test, carotid dopplers and 2D echo.  Heparin infusion has been discontinued by provider, and pharmacy is now consulted to transition pt back to apixaban. Per RN, no bleeding issues observed.  H/H 9.9/32.1, plt 498; 65 yrs old, 40.4 kg, Scr 0.73  Goal of Therapy:  Heparin level 0.3-0.7 units/ml Monitor platelets by anticoagulation protocol: Yes   Plan:  Restart apixaban 5 mg PO BID this evening Monitor CBC, signs/symptoms of bleeding  05-25-1979, PharmD, BCPS, [facility] Clinical Pharmacist 09/02/2020 4:22 PM

## 2020-09-02 NOTE — Progress Notes (Signed)
Well known to me S/p leadless pacemaker with chronic battery depletion.   We have discussed at length on multiple occasions and have opted for a conservative approach.  The device was interrogated yesterday.  I have reviewed and confirm that it is functioning normally.  The device is programmed VVI 50 bpm but with a negative 10 bpm hysteresis to allow for functional VVI 40 bpm.  Rhythm is stable and she is without symptoms.  No further inpatient workup is planned.  Electrophysiology team to see as needed while here. Please call with questions.  Hillis Range MD, [facility] 09/02/2020 4:10 PM

## 2020-09-03 ENCOUNTER — Encounter: Payer: Self-pay | Admitting: *Deleted

## 2020-09-03 DIAGNOSIS — Z006 Encounter for examination for normal comparison and control in clinical research program: Secondary | ICD-10-CM

## 2020-09-03 DIAGNOSIS — R079 Chest pain, unspecified: Secondary | ICD-10-CM | POA: Diagnosis not present

## 2020-09-03 LAB — CBC
HCT: 29.1 % — ABNORMAL LOW (ref 36.0–46.0)
Hemoglobin: 9.2 g/dL — ABNORMAL LOW (ref 12.0–15.0)
MCH: 25 pg — ABNORMAL LOW (ref 26.0–34.0)
MCHC: 31.6 g/dL (ref 30.0–36.0)
MCV: 79.1 fL — ABNORMAL LOW (ref 80.0–100.0)
Platelets: 490 10*3/uL — ABNORMAL HIGH (ref 150–400)
RBC: 3.68 MIL/uL — ABNORMAL LOW (ref 3.87–5.11)
RDW: 16.4 % — ABNORMAL HIGH (ref 11.5–15.5)
WBC: 9.3 10*3/uL (ref 4.0–10.5)
nRBC: 0 % (ref 0.0–0.2)

## 2020-09-03 MED ORDER — ENSURE ENLIVE PO LIQD
237.0000 mL | Freq: Three times a day (TID) | ORAL | Status: DC
  Administered 2020-09-03 – 2020-09-07 (×6): 237 mL via ORAL

## 2020-09-03 MED ORDER — METOPROLOL SUCCINATE ER 25 MG PO TB24
12.5000 mg | ORAL_TABLET | Freq: Every day | ORAL | Status: DC
  Administered 2020-09-04 – 2020-09-08 (×5): 12.5 mg via ORAL
  Filled 2020-09-03 (×5): qty 1

## 2020-09-03 MED ORDER — METOPROLOL SUCCINATE ER 25 MG PO TB24: 12.5000 mg | ORAL_TABLET | Freq: Every day | ORAL | Status: DC

## 2020-09-03 MED ORDER — ADULT MULTIVITAMIN W/MINERALS CH
1.0000 | ORAL_TABLET | Freq: Every day | ORAL | Status: DC
  Administered 2020-09-03 – 2020-09-08 (×6): 1 via ORAL
  Filled 2020-09-03 (×6): qty 1

## 2020-09-03 NOTE — Progress Notes (Signed)
Mobility Specialist: Progress Note   09/03/20 1138  Mobility  Activity Transferred:  Bed to chair;Transferred to/from [facility]  Level of Assistance Minimal assist, patient does 75% or more  Assistive Device Front wheel walker  Mobility Response Tolerated fair  Mobility performed by Mobility specialist  Bed Position Chair  $Mobility charge 1 Mobility   Pre-Mobility: 62 HR, 100% SpO2 Post-Mobility: 58 HR, 122/66 BP, 100% SpO2  Pt was minA to stand and during transfer. Pt required frequent cues for RW placement and to stand tall during transfer. Pt presenting with hip flexion during transfer while wearing darco shoe. Pt to [facility] after session and requesting pain medication, RN notified.   [facility] [place] [place] Mobility Specialist

## 2020-09-03 NOTE — Progress Notes (Signed)
Physical Therapy Treatment Patient Details Name: Mercedes [name] MRN: [id number] DOB: 1956-06-28 Today's Date: 09/03/2020    History of Present Illness 65 year old female with L above-knee amputation in remote history.  Underwent Right common femoral, profundofemoral and SFA endarterectomy and Right common femoral to below-knee popliteal artery bypass 12/28. Now s/p Amputation right first toe with resection of metatarsal head on 08/31/20.  Cardiology to work patient up due to c/o chest pain and tightness with possible pacemaker surgery if indicated.    PT Comments    Pt performed transfer from recliner into standing x 2 with mod assistance.  First attempt RW too short, adjusted height which improved posture.  Pt continues to benefit from aggressive rehab in a post acute setting.  LLE prosthesis not used as family unable to locate shrinker.  Will continue to follow during acute care.    Follow Up Recommendations  CIR     Equipment Recommendations  Rolling walker with 5" wheels;Wheelchair (measurements PT);Wheelchair cushion (measurements PT)    Recommendations for Other Services Rehab consult     Precautions / Restrictions Precautions Precautions: Fall Precaution Comments: use prosthetic shrinker to help with L LE swelling- not in room Required Braces or Orthoses: Other Brace (L prosthetic) Other Brace: Darco shoe for right foot with heel weight beraing Restrictions Weight Bearing Restrictions: Yes RLE Weight Bearing:  (heel weight bearing.) Other Position/Activity Restrictions: Per MD heel weight bearing.    Mobility  Bed Mobility Overal bed mobility: Needs Assistance Bed Mobility: Supine to Sit;Sit to Supine Rolling: Supervision   Supine to sit: Supervision Sit to supine: Supervision   General bed mobility comments: Supervision for safety  Transfers Overall transfer level: Needs assistance Equipment used: Rolling walker (2 wheeled) Transfers: Sit to/from Stand;Stand  Pivot Transfers Sit to Stand: From elevated surface;Mod assist;+2 safety/equipment Stand pivot transfers: Min assist;From elevated surface       General transfer comment: Pt required assistance to rise into standing.  Mod assistance to boost.  On 1st attempt pt flexed forward due to low height of RW.  Adjusted height on RW and improved posture noted during second trial.  Pt able to perform hop steps maintaining heel weight bearing from chair to bed.  Ambulation/Gait                 Stairs             Wheelchair Mobility    Modified Rankin (Stroke Patients Only)       Balance Overall balance assessment: Needs assistance Sitting-balance support: No upper extremity supported Sitting balance-Leahy Scale: Good       Standing balance-Leahy Scale: Poor                              Cognition Arousal/Alertness: Awake/alert Behavior During Therapy: WFL for tasks assessed/performed Overall Cognitive Status: Within Functional Limits for tasks assessed                                        Exercises      General Comments        Pertinent Vitals/Pain Pain Assessment: Faces Faces Pain Scale: Hurts even more Pain Location: R groin and R foot Pain Descriptors / Indicators: Burning;Sore Pain Intervention(s): Monitored during session;Repositioned    Home Living  Prior Function            PT Goals (current goals can now be found in the care plan section) Acute Rehab PT Goals Patient Stated Goal: Try to get home and start waking again. Potential to Achieve Goals: Good Additional Goals Additional Goal #1: Don L LE prosthetic to assist in transfer to wheelchair Progress towards PT goals: Progressing toward goals    Frequency    Min 3X/week      PT Plan Current plan remains appropriate    Co-evaluation              AM-PAC PT "6 Clicks" Mobility   Outcome Measure  Help needed turning  from your back to your side while in a flat bed without using bedrails?: A Little Help needed moving from lying on your back to sitting on the side of a flat bed without using bedrails?: A Little Help needed moving to and from a bed to a chair (including a wheelchair)?: A Little Help needed standing up from a chair using your arms (e.g., wheelchair or bedside chair)?: A Lot Help needed to walk in hospital room?: A Lot Help needed climbing 3-5 steps with a railing? : A Lot 6 Click Score: 15    End of Session Equipment Utilized During Treatment: Gait belt Activity Tolerance: Patient tolerated treatment well Patient left: in chair;with call bell/phone within reach;with chair alarm set Nurse Communication: Mobility status PT Visit Diagnosis: Unsteadiness on feet (R26.81);Other abnormalities of gait and mobility (R26.89);Muscle weakness (generalized) (M62.81);Difficulty in walking, not elsewhere classified (R26.2);Pain     Time: 1610-9604 PT Time Calculation (min) (ACUTE ONLY): 16 min  Charges:  $Therapeutic Activity: 8-22 mins                     Mercedes [name] , PTA Acute Rehabilitation Services Pager [phone] Office [phone]     Mercedes [name] 09/03/2020, 4:36 PM

## 2020-09-03 NOTE — Progress Notes (Signed)
PT Cancellation Note  Patient Details Name: Mercedes [name] MRN: [id number] DOB: October 10, 1955   Cancelled Treatment:    Reason Eval/Treat Not Completed: (P) Medical issues which prohibited therapy (Pt requesting to sit up longer, educated patient to let nursing know when she is ready for back to bed and PT will return if able.)   [place] Brogden J Aundria Rud 09/03/2020, 1:16 PM  Bonney Leitz , PTA Acute Rehabilitation Services Pager [phone] Office [phone]

## 2020-09-03 NOTE — Progress Notes (Signed)
Inpatient Rehab Admissions:  Inpatient Rehab Consult received.  I was able to speak to the patient over the phone for rehabilitation assessment and to discuss goals and expectations of an inpatient rehab admission.  She is hopeful for CIR program, and I believe she would do very well.  We discussed average length of stay to be about 2 weeks, which can vary based on progress.  I also let her know that I would need to get insurance authorization for CIR, and I was not sure that [facility] Medicare would approve.  She would like for me to try, so I will start that process today.  Would not expect to hear an answer back before Monday at the earliest.  Will continue to follow in the mean time and check back with patient once I hear from insurance.   Signed: Estill Dooms, PT, DPT Admissions Coordinator [phone] 09/03/20  3:04 PM

## 2020-09-03 NOTE — Progress Notes (Addendum)
  Progress Note    09/03/2020 8:03 AM 3 Days Post-Op  Subjective:  No complaints. Hopeful to go to rehab soon   Vitals:   09/03/20 0323 09/03/20 0727  BP: (!) 100/57 120/77  Pulse: 68   Resp: 14 16  Temp: 99.8 F (37.7 C) 98 F (36.7 C)  SpO2: 95% 100%   Physical Exam: Cardiac:  regular Lungs: non labored Incisions: right lower extremity incisions clean, dry and intact. Right great toe amputation site with viable flaps   Extremities: well perfused and warm. Palpable DP pulse Neurologic: alert and oriented  CBC    Component Value Date/Time   WBC 9.3 09/03/2020 0324   RBC 3.68 (L) 09/03/2020 0324   HGB 9.2 (L) 09/03/2020 0324   HGB 14.2 10/09/2018 1125   HCT 29.1 (L) 09/03/2020 0324   HCT 44.6 10/09/2018 1125   PLT 490 (H) 09/03/2020 0324   PLT 289 10/09/2018 1125   MCV 79.1 (L) 09/03/2020 0324   MCV 79 10/09/2018 1125   MCH 25.0 (L) 09/03/2020 0324   MCHC 31.6 09/03/2020 0324   RDW 16.4 (H) 09/03/2020 0324   RDW 14.8 10/09/2018 1125   LYMPHSABS 1.7 08/27/2020 0056   MONOABS 0.4 08/27/2020 0056   EOSABS 0.4 08/27/2020 0056   BASOSABS 0.0 08/27/2020 0056    BMET    Component Value Date/Time   NA 133 (L) 09/01/2020 0829   NA 135 10/09/2018 1125   K 4.0 09/01/2020 0829   CL 99 09/01/2020 0829   CO2 25 09/01/2020 0829   GLUCOSE 103 (H) 09/01/2020 0829   BUN 18 09/01/2020 0829   BUN 10 10/09/2018 1125   CREATININE 0.73 09/01/2020 0829   CREATININE 1.13 (H) 08/03/2016 1440   CALCIUM 9.0 09/01/2020 0829   GFRNONAA >60 09/01/2020 0829   GFRAA >60 11/24/2019 0620    INR    Component Value Date/Time   INR 1.0 11/18/2019 1844     Intake/Output Summary (Last 24 hours) at 09/03/2020 0803 Last data filed at 09/02/2020 1900 Gross per 24 hour  Intake 960 ml  Output --  Net 960 ml     Assessment/Plan:  65 y.o. female is s/p R CFA endarterectomy and femoral to popliteal bypass with PTFE 12/28 and subsequent R GT amputation 3 Days Post-Op \  Doing well  post op. RLE well perfused and warm with palpable DP pulse. Right Great toe amputation with viable skin edges. RLE incisions clean, dry and intact. Continue to ambulate as tolerated with darco shoe- heal weightbearing only. EP to follow as outpatient. Appreciate cardiology assistance. Cardiology testing appears stable. Okay for rehab from vascular standpoint. Pending CIR admission  DVT prophylaxis:    Dory Horn [facility] [phone] 09/03/2020 8:03 AM   I have independently interviewed and examined patient and agree with PA assessment and plan above.   Federica Allport C. Randie Heinz, MD [facility] [place] Office: [phone] Pager: [phone]

## 2020-09-03 NOTE — Progress Notes (Signed)
Progress Note  Patient Name: Mercedes [name] Date of Encounter: 09/03/2020  [facility] Cardiologist: Olga Millers, MD   Subjective  Feels well today. No chest tightness. Hoping to go to rehab soon.  Myoview poor quality but overall no evidence of ischemia. Has areas of infarct in the apex consistent with prior infarct. Plan for continued aggressive medical management of CAD and if recurrence of chest discomfort, can pursue cath at that time.   TTE with normal LVEF, no wall motion abnormalities.  Inpatient Medications    Scheduled Meds: . apixaban  5 mg Oral BID  . aspirin EC  81 mg Oral Daily  . docusate sodium  100 mg Oral Daily  . ezetimibe  10 mg Oral Daily  . isosorbide mononitrate  30 mg Oral Daily  . metoprolol tartrate  12.5 mg Oral BID  . pantoprazole  40 mg Oral Daily  . Ensure Max Protein  8 oz Oral Daily  . rosuvastatin  40 mg Oral QHS  . sodium chloride flush  3 mL Intravenous Q12H   Continuous Infusions: . sodium chloride    . magnesium sulfate bolus IVPB     PRN Meds: sodium chloride, acetaminophen, alum & mag hydroxide-simeth, guaiFENesin-dextromethorphan, hydrALAZINE, HYDROmorphone (DILAUDID) injection, labetalol, magnesium sulfate bolus IVPB, nitroGLYCERIN, ondansetron (ZOFRAN) IV, oxyCODONE-acetaminophen, phenol, potassium chloride, sodium chloride flush, sodium phosphate   Vital Signs    Vitals:   09/02/20 1933 09/02/20 2301 09/03/20 0323 09/03/20 0727  BP: 110/62 111/86 (!) 100/57 120/77  Pulse: 68 82 68   Resp: 16 17 14 16   Temp: 98.7 F (37.1 C) 99.7 F (37.6 C) 99.8 F (37.7 C) 98 F (36.7 C)  TempSrc: Oral Oral Oral Oral  SpO2: 94% 95% 95% 100%  Weight:      Height:        Intake/Output Summary (Last 24 hours) at 09/03/2020 0827 Last data filed at 09/02/2020 1900 Gross per 24 hour  Intake 960 ml  Output --  Net 960 ml   Last 3 Weights 08/23/2020 05/31/2020 01/15/2020  Weight (lbs) 89 lb 98 lb 3.2 oz 95 lb  Weight (kg) 40.37 kg  44.543 kg 43.092 kg      Telemetry    NSR with occasional PVCs - Personally Reviewed  ECG    No new tracing - Personally Reviewed  Physical Exam   GEN: No acute distress. Sitting comfortably in bed  Neck: No JVD Cardiac: RRR, no murmurs, rubs, or gallops.  Respiratory: Clear to auscultation bilaterally. GI: Soft, nontender, non-distended  MS: No edema; Right foot bandaged; s/p L AKA Neuro:  Nonfocal  Psych: Normal affect   Labs    High Sensitivity Troponin:   Recent Labs  Lab 09/01/20 0829  TROPONINIHS 5      Chemistry Recent Labs  Lab 09/01/20 0829  NA 133*  K 4.0  CL 99  CO2 25  GLUCOSE 103*  BUN 18  CREATININE 0.73  CALCIUM 9.0  PROT 6.7  ALBUMIN 2.4*  AST 20  ALT 15  ALKPHOS 74  BILITOT 0.5  GFRNONAA >60  ANIONGAP 9     Hematology Recent Labs  Lab 09/01/20 0829 09/02/20 0554 09/03/20 0324  WBC 10.4 12.3* 9.3  RBC 4.17 4.00 3.68*  HGB 10.1* 9.9* 9.2*  HCT 33.2* 32.1* 29.1*  MCV 79.6* 80.3 79.1*  MCH 24.2* 24.8* 25.0*  MCHC 30.4 30.8 31.6  RDW 15.8* 15.9* 16.4*  PLT 472* 498* 490*    BNPNo results for input(s): BNP, PROBNP  in the last 168 hours.   DDimer No results for input(s): DDIMER in the last 168 hours.   Radiology    NM Myocar Multi W/Spect W/Wall Motion / EF  Result Date: 09/02/2020  There was no ST segment deviation noted during stress.  No T wave inversion was noted during stress.  Findings consistent with prior myocardial infarction.  This is an intermediate risk study.  The left ventricular ejection fraction is normal (55-65%).  There is a medium sized, moderate defect that is non-reversible in the apex, there is associated wall motion abnormality consistent with prior infarct.  There is an anteroseptal rest perfusion defect. Their anterolateral stress defect may be artifactual in the setting of patient movement between rest and stress, as noted by the change in location of the diaphragmatic attenuation.  Study  suggestive of prior infarction with associated wall motion abnormality. Less evidence to suggest anterolateral ischemia.  Poor quality study. Compared to 2015 study report; infarct size may be larger.  ECHOCARDIOGRAM COMPLETE  Result Date: 09/02/2020    ECHOCARDIOGRAM REPORT   Patient Name:   Mercedes [name] Date of Exam: 09/02/2020 Medical Rec #:  [id number]         Height:       65.0 in Accession #:    [id number]        Weight:       89.0 lb Date of Birth:  [birth year]         BSA:          1.402 m Patient Age:    64 years          BP:           133/72 mmHg Patient Gender: F                 HR:           62 bpm. Exam Location:  Inpatient Procedure: 2D Echo, Color Doppler and Cardiac Doppler Indications:    R07.9* Chest pain, unspecified  History:        Patient has prior history of Echocardiogram examinations, most                 recent 07/17/2016. Prior CABG and Pacemaker, Arrythmias:Atrial                 Fibrillation; Risk Factors:Hypertension, Diabetes and                 Dyslipidemia.  Sonographer:    Irving Burton Senior RDCS Referring Phys: [address] [place]  Sonographer Comments: Technically difficult due to small rib spacing. IMPRESSIONS  1. Left ventricular ejection fraction, by estimation, is 55 to 60%. The left ventricle has normal function. The left ventricle has no regional wall motion abnormalities. Left ventricular diastolic parameters were normal.  2. Right ventricular systolic function is normal. The right ventricular size is normal. There is normal pulmonary artery systolic pressure. The estimated right ventricular systolic pressure is 23.8 mmHg.  3. The mitral valve is normal in structure. No evidence of mitral valve regurgitation. No evidence of mitral stenosis.  4. The aortic valve is normal in structure. Aortic valve regurgitation is not visualized. No aortic stenosis is present.  5. The inferior vena cava is normal in size with greater than 50% respiratory variability, suggesting right atrial  pressure of 3 mmHg. FINDINGS  Left Ventricle: Left ventricular ejection fraction, by estimation, is 55 to 60%. The left ventricle has normal function. The left ventricle has no  regional wall motion abnormalities. The left ventricular internal cavity size was normal in size. There is  no left ventricular hypertrophy. Left ventricular diastolic parameters were normal. Right Ventricle: The right ventricular size is normal. No increase in right ventricular wall thickness. Right ventricular systolic function is normal. There is normal pulmonary artery systolic pressure. The tricuspid regurgitant velocity is 2.28 m/s, and  with an assumed right atrial pressure of 3 mmHg, the estimated right ventricular systolic pressure is 23.8 mmHg. Left Atrium: Left atrial size was normal in size. Right Atrium: Right atrial size was normal in size. Pericardium: There is no evidence of pericardial effusion. Mitral Valve: The mitral valve is normal in structure. No evidence of mitral valve regurgitation. No evidence of mitral valve stenosis. Tricuspid Valve: The tricuspid valve is normal in structure. Tricuspid valve regurgitation is mild . No evidence of tricuspid stenosis. Aortic Valve: The aortic valve is normal in structure. Aortic valve regurgitation is not visualized. No aortic stenosis is present. Pulmonic Valve: The pulmonic valve was normal in structure. Pulmonic valve regurgitation is not visualized. No evidence of pulmonic stenosis. Aorta: The aortic root is normal in size and structure. Venous: The inferior vena cava is normal in size with greater than 50% respiratory variability, suggesting right atrial pressure of 3 mmHg. IAS/Shunts: No atrial level shunt detected by color flow Doppler. Additional Comments: A pacer wire is visualized in the right ventricle and right atrium.  LEFT VENTRICLE PLAX 2D LVIDd:         4.70 cm  Diastology LVIDs:         3.00 cm  LV e' medial:    6.85 cm/s LV PW:         1.10 cm  LV E/e' medial:   12.9 LV IVS:        0.80 cm  LV e' lateral:   8.27 cm/s LVOT diam:     2.10 cm  LV E/e' lateral: 10.7 LV SV:         72 LV SV Index:   51 LVOT Area:     3.46 cm  RIGHT VENTRICLE RV S prime:     8.81 cm/s TAPSE (M-mode): 1.8 cm LEFT ATRIUM             Index       RIGHT ATRIUM           Index LA diam:        3.60 cm 2.57 cm/m  RA Area:     11.50 cm LA Vol (A2C):   33.5 ml 23.89 ml/m RA Volume:   23.00 ml  16.40 ml/m LA Vol (A4C):   44.8 ml 31.95 ml/m LA Biplane Vol: 40.1 ml 28.60 ml/m  AORTIC VALVE LVOT Vmax:   95.30 cm/s LVOT Vmean:  65.600 cm/s LVOT VTI:    0.207 m  AORTA Ao Root diam: 3.30 cm Ao Asc diam:  3.10 cm MITRAL VALVE               TRICUSPID VALVE MV Area (PHT): 3.53 cm    TR Peak grad:   20.8 mmHg MV Decel Time: 215 msec    TR Vmax:        228.00 cm/s MV E velocity: 88.30 cm/s MV A velocity: 57.00 cm/s  SHUNTS MV E/A ratio:  1.55        Systemic VTI:  0.21 m  Systemic Diam: 2.10 cm Tobias Alexander MD Electronically signed by Tobias Alexander MD Signature Date/Time: 09/02/2020/3:03:17 PM    Final    VAS US CAROTID  Result Date: 09/02/2020 Carotid Arterial Duplex Study Indications:       Right bruit. Risk Factors:      Hypertension, hyperlipidemia, PAD. Comparison Study:  No prior study Performing Technologist: Gertie Fey MHA, RDMS, RVT, RDCS  Examination Guidelines: A complete evaluation includes B-mode imaging, spectral Doppler, color Doppler, and power Doppler as needed of all accessible portions of each vessel. Bilateral testing is considered an integral part of a complete examination. Limited examinations for reoccurring indications may be performed as noted.  Right Carotid Findings: +----------+-------+--------+--------+--------------------------------+--------+           PSV    EDV cm/sStenosisPlaque Description              Comments           cm/s                                                             +----------+-------+--------+--------+--------------------------------+--------+ CCA Prox  105    9                                                        +----------+-------+--------+--------+--------------------------------+--------+ CCA Distal66     17                                                       +----------+-------+--------+--------+--------------------------------+--------+ ICA Prox  52     19              smooth and homogeneous                   +----------+-------+--------+--------+--------------------------------+--------+ ICA Distal116    27                                                       +----------+-------+--------+--------+--------------------------------+--------+ ECA       419    31      >50%    hypoechoic, smooth and                                                    homogeneous                              +----------+-------+--------+--------+--------------------------------+--------+ +----------+--------+-------+----------------+-------------------+           PSV cm/sEDV cmsDescribe        Arm Pressure (mmHG) +----------+--------+-------+----------------+-------------------+ [id number]            Multiphasic, WNL                    +----------+--------+-------+----------------+-------------------+ +---------+--------+--+--------+--+---------+  VertebralPSV cm/s97EDV cm/s13Antegrade +---------+--------+--+--------+--+---------+  Left Carotid Findings: +----------+--------+-------+--------+----------------------+------------------+           PSV cm/sEDV    StenosisPlaque Description    Comments                             cm/s                                                    +----------+--------+-------+--------+----------------------+------------------+ CCA Prox  89      12                                                       +----------+--------+-------+--------+----------------------+------------------+ CCA Distal79      17                                   intimal thickening +----------+--------+-------+--------+----------------------+------------------+ ICA Prox  71      17             irregular and calcific                   +----------+--------+-------+--------+----------------------+------------------+ ICA Distal87      24                                                      +----------+--------+-------+--------+----------------------+------------------+ ECA       103     11             smooth and                                                                heterogenous                             +----------+--------+-------+--------+----------------------+------------------+ +----------+--------+--------+----------------+-------------------+           PSV cm/sEDV cm/sDescribe        Arm Pressure (mmHG) +----------+--------+--------+----------------+-------------------+ [id number]             Multiphasic, WNL                    +----------+--------+--------+----------------+-------------------+ +---------+--------+---+--------+--+---------+ VertebralPSV cm/s101EDV cm/s19Antegrade +---------+--------+---+--------+--+---------+   Summary: Right Carotid: Velocities in the right ICA are consistent with a 1-39% stenosis.                The ECA appears >50% stenosed. Left Carotid: Velocities in the left ICA are consistent with a 1-39% stenosis. Vertebrals:  Bilateral vertebral arteries demonstrate antegrade flow. Subclavians: Normal flow hemodynamics were seen in bilateral subclavian              arteries. *See table(s) above for measurements and observations.  Electronically signed by Delia Heady MD on 09/02/2020 at 3:00:26 PM.    Final     Cardiac Studies   Myoview 09/03/19:  There was no ST segment deviation noted during stress.  No T wave inversion was noted during  stress.  Findings consistent with prior myocardial infarction.  This is an intermediate risk study.  The left ventricular ejection fraction is normal (55-65%).  There is a medium sized, moderate defect that is non-reversible in the apex, there is associated wall motion abnormality consistent with prior infarct.  There is an anteroseptal rest perfusion defect. Their anterolateral stress defect may be artifactual in the setting of patient movement between rest and stress, as noted by the change in location of the diaphragmatic attenuation.   Study suggestive of prior infarction with associated wall motion abnormality. Less evidence to suggest anterolateral ischemia.   Poor quality study. Compared to 2015 study report; infarct size may be larger.  TTE 09/03/19: IMPRESSIONS   1. Left ventricular ejection fraction, by estimation, is 55 to 60%. The  left ventricle has normal function. The left ventricle has no regional  wall motion abnormalities. Left ventricular diastolic parameters were  normal.  2. Right ventricular systolic function is normal. The right ventricular  size is normal. There is normal pulmonary artery systolic pressure. The  estimated right ventricular systolic pressure is 23.8 mmHg.  3. The mitral valve is normal in structure. No evidence of mitral valve  regurgitation. No evidence of mitral stenosis.  4. The aortic valve is normal in structure. Aortic valve regurgitation is  not visualized. No aortic stenosis is present.  5. The inferior vena cava is normal in size with greater than 50%  respiratory variability, suggesting right atrial pressure of 3 mmHg.  Echo TEE OR 07/17/16 Left ventricle: Normal cavity size. Concentric hypertrophy of mild  severity. LV systolic function is normal with an EF of 60-65%. There are  no obvious wall motion abnormalities. No thrombus present. No mass  present.   Aortic valve: No AV vegetation.   Mitral valve: No leaflet  thickening and calcification present. Trace  regurgitation.   Right ventricle: Normal cavity size, wall thickness and ejection  fraction.   Tricuspid valve: Trace regurgitation. The tricuspid valve regurgitation  jet is central.   Pulmonic valve: Trace regurgitation.  Carotid Artery Duplex 09/01/20 Right Carotid: Velocities in the right ICA are consistent with a 1-39%  stenosis.         The ECA appears >50% stenosed.   Left Carotid: Velocities in the left ICA are consistent with a 1-39%  stenosis.   Vertebrals: Bilateral vertebral arteries demonstrate antegrade flow.  Subclavians: Normal flow hemodynamics were seen in bilateral subclavian        arteries.    Patient Profile     65 y.o. female with a hx of NSTEMI, CABG 2017, carotid disease, PAF, tachy-brady syndrome with PPM at [facility] but pt has not wished to have it replaced, PAD with hx of L fem-pop bypass 2015 and thrombectomy left fem-pop bypass patch angioplastyproximal left femoral popliteal and common femoral artery and thrombectomy left external iliac artery, profunda, superficial femoral artery in September 2020then Lt transmetatarsal amputation 06/2019, carotid disease, HLD and tobacco use who presented with a right dusky foot now s/p R CFA endarterectomy and femoral to popliteal bypass with PTFE and subsequent R GT amputation on 08/31/20. Post-op course complicated by chest pain for which Cardiology has been consulted.  Assessment & Plan   #Chest Pain #Known multivessel  CAD s/p ACB: Patient with episode of chest pain post-operatively that reminded her of her anginal symptoms that she developed prior to ACB. Trop negative. ECG without ischemic changes. Myoview with evidence of infarct but no active ischemia. TTE with normal EF, no WMA. Given reassuring cardiac work-up, will continue with aggressive medical therapy. Can pursue cath in the future if symptoms return. -Continue aggressive medical  therapy -Stopped heaprin gtt and resumed home apixaba -Myoview with evidence of prior MI but no reversible ischemia -TTE with normal EF 55-60%, no WMA -Continue ASA 81mg , rosuvastatin 40mg  and zetia 10mg  daily -Change metop to 25mg  XL -Started imdur 30mg  daily -No further cardiac work-up needed at this time; if symptoms recur, can pursue coronary angiography in the future  #Tachy-brady syndrome s/p PPM placement--non-functioning as reached RRT at 2020 and patient declined PPM replacement -Dr. Estill Dooms consulted and given recent gangrenous toe and no acute indication for pacing at this time; will follow-up with EP as out-patient for further management  #Paroxysmal Afib: CHADS-vasc 5. On eliquis for [facility] at home that is currently held given patient on heparin gtt as above. -Change metop to 12.5mg  XL daily -Continue apixaban 5mg  BID  #PAD: Significant PAD with hx of failure of Lt fem pop bypass and Lt AKA and now with R CFA endarterectomy and femoral to popliteal bypass with PTFE and subsequent R GT amputation on 08/31/20.  -Continue ASA, statin, zetia as above -Vascular surgery following  #Carotid Artery Disease: Right ICA are consistent with a 1-39% stenosis. Left ICA are consistent with a 1-39%  Stenosis on duplex 09/01/20. -Continue ASA, statin and zetia as above   [facility] will sign off.   Medication Recommendations:  Continue metop, imdur, ASA, statin and zetia Other recommendations (labs, testing, etc):  N/A Follow up as an outpatient:  Will arrange for Cardiology follow-up after discharge  For questions or updates, please contact [facility] Please consult [email or web address] for contact info under        Signed, [name], MD  09/03/2020, 8:27 AM

## 2020-09-03 NOTE — Plan of Care (Signed)
  Problem: Clinical Measurements: Goal: Respiratory complications will improve Outcome: Progressing Goal: Cardiovascular complication will be avoided Outcome: Progressing   

## 2020-09-03 NOTE — Progress Notes (Signed)
Initial Nutrition Assessment  DOCUMENTATION CODES:   Severe malnutrition in context of chronic illness,Underweight  INTERVENTION:    Ensure Enlive po TID, each supplement provides 350 kcal and 20 grams of protein  MVI daily   NUTRITION DIAGNOSIS:   Severe Malnutrition related to chronic illness (PVD s/p amputations) as evidenced by severe fat depletion,severe muscle depletion.  GOAL:   Patient will meet greater than or equal to 90% of their needs  MONITOR:   PO intake,Supplement acceptance,Weight trends,Labs,I & O's  REASON FOR ASSESSMENT:   LOS    ASSESSMENT:   Patient with PMH significant for essential HTN, secondary cardiomyopathy, PVD s/p L FP, RA, SLE, CAD s/p CABG, DM, CKD II, s/p L AKA, and depression. Presents this admission with gangrene R toes.  Pt endorses a decrease in appetite over over the last 6 months due to lethargy and foot pain. States during this time she consumed three small meals that consisted of B-2 eggs, toast L-sometimes skip, sometimes would eat out at restaurant D- meat (chicken, fish) with vegetable. She tried drinking one to two Ensure MAX daily depending how intake throughout the day. Appetite this admission has progressed to 80-100% for each meal. Pt discouraged about weight loss, specifically muscle loss. States the Ensure does not seem to be helping. RD encouraged high calorie high protein supplements vs low calorie high protein supplements to promote weight gain. Pt very appreciate.   Pt endorses a UBW of 131 lb and a weight loss of 30-40 lbs. Records indicate pt weighed 107 lb on 10/24/2019 (s/p L AKA) and 89 lb this admission (17% wt loss in ten months).     12/28- R SFA endarterectomy, R fem-pop bypass 01/04- R great toe amputation  Medications: colace Labs: Na 133 (L)   NUTRITION - FOCUSED PHYSICAL EXAM:  Flowsheet Row Most Recent Value  Orbital Region Severe depletion  Upper Arm Region Severe depletion  Thoracic and Lumbar Region  Severe depletion  Buccal Region Severe depletion  Temple Region Severe depletion  Clavicle Bone Region Severe depletion  Clavicle and Acromion Bone Region Severe depletion  Scapular Bone Region Severe depletion  Dorsal Hand Severe depletion  Patellar Region Unable to assess  Anterior Thigh Region Unable to assess  Posterior Calf Region Unable to assess  Edema (RD Assessment) None  Hair Reviewed  Eyes Reviewed  Mouth Reviewed  Skin Reviewed  Nails Reviewed     Diet Order:   Diet Order            Diet regular Room service appropriate? Yes with Assist; Fluid consistency: Thin  Diet effective now                 EDUCATION NEEDS:   Education needs have been addressed  Skin:  Skin Assessment: Skin Integrity Issues: Skin Integrity Issues:: Incisions Incisions: R leg, R foot, R groin  Last BM:  1/6  Height:   Ht Readings from Last 1 Encounters:  08/23/20 5\' 5"  (1.651 m)    Weight:   Wt Readings from Last 1 Encounters:  08/23/20 40.4 kg    BMI:  Body mass index is 14.81 kg/m.  Estimated Nutritional Needs:   Kcal:  1300-1500 kcal  Protein:  65-80 grams  Fluid:  >/= 1.3 L/day  08/25/20 RD, LDN Clinical Nutrition Pager listed in AMION

## 2020-09-03 NOTE — Research (Addendum)
"CONSENT"   YES     NO   Continuing further Investigational Product and study visits for follow-up? [x]  []   Continuing consent from future biomedical research [x]  []    * Patient signed the current Revised consent (Revision-P) dated 08-09-2020                                  "EVENTS"    YES     NO  AE   (IF YES SEE SOURCE) []  [x]   SAE  (IF YES SEE SOURCE) []  [x]   ENDPOINT   (IF YES SEE SOURCE) []  [x]   HOSPITALIZATIONS OR URGENT CARE VISITS [x]  []   PROTOCOL DEVIATIONS []  [x]    []  []    Patient was seen for her 72 month NANOSTIM Leadless PM visit while she was hospitalized S/P Right femoral- below knee popliteal bypass graft and right great toe amputation. Patient is doing well overall and is waiting to see if she will be accepted into CIR.   Date of assessment:    01-07-2022_                 Site ID / Name : [id number]  S[facility] Patient ID / Site patient ID: __[id number]____  I.  PHYSICIAN NAME:  Last Name:  [name]      First Name:  Mercedes Fearing           Middle Intial:   II. VISIT TYPE:  []  2 wk  []  6 wk  []   3 mo  []  6 mo  []  12 mo  []  18 mo []  24 mo []  30 mo  []   36 mo                   []  42 mo  []  48 mo  []   54 mo  []   60 mo  []   66 mo  [x]   72 mo []  78 mo  []  84 mo                            []  90 mo  []  96 mo []  102 mo        []  Unscheduled visit; IF unscheduled, check one;    []  In Clinic   []  Remote        []  Other type visit; Specify: ____________________       1. Cardiac Drug Therapy:     []  None              Check all that apply, If a drug is a combination drug, check both categories.                Drug Category                         Drug Names (Examples)       []  Antiarrhythmics (Class I)      Disopyramide / Norpace  Flecainide / Tambocor  Mexiletine  Procainamide / Pronestyl, Procan SR  Propafenone / Rythmol  Quinidine      []  Antiarrhythmics (Class III)   Amiodarone / Pacerone  Dofetilide / Tikosyn  Dronedarone / Multaq  Ibutilide /  Covert  Sotalol / Betapace     [x]  Anticoagulants  Ticagrelor / Brilinta  Apixaban / Eliquis  Dabigatran / Pradaxa  Rivaroxaban / Xarelto  Edoxaban / Savaysa  Fondaparinux /  Arixtra  Enoxaparin / Lovenox  Coumadin / Warfarin  Heparin      [x]  Anti-platelets  Acetylsalicylic acid / Aspirin  Clopidogrel / Plavix   Prasugrel / Effient  Ticlopidine / Ticlid  Cilostazol / Pletal   Dipyridamole / Persantine  Aggrostat    []  ACE inhibitors  Benazepril / Lotensin  Captopril / Capoten  Enalapril / Vasotec  Lisinopril / Prinivil, Zestril  Quinapril / Accupril  Ramipril / Altace  Trandolapril / Mavik    []  ARBs (Angiotensin II recep.blocker)  Candesartan / Atacand  Eprosartan / Teveten  Irbesartan /  Avapro  Losartan / Cozaar  Olmesartan / Benicar  Telmisartan / Micardis  Valsartan / Diovan    [x]   Beta Blockers  Acebutolol / Sectral  Atenolol / Tenormin  Bisoprolol / Zebeta  Carvedilol / Coreg  Esmolol / Brevibloc  Labetalol / Normodyne  Metoprolol / Lopressor, Toprol  Nadolol / Corgard  Nebivolol / Bystolic  Propranolol / Inderal  Timolol / Blocadren                2. Has the subject experienced an Adverse Event since last assessment?                      []  Yes,   If yes, please complete an Adverse Event Form                     [x]  No                      Assessments:   Nanostim Leadless Pacemaker (LP) Assessment and Programming (All visits)   [x]  Yes   []  No   If No, please specify: _________________   And submit a deviation form   24- Hour Holter Monitor performed?                                                                   Not applicable    Graded Exercise Test (CAEP) performed?   []  Yes []  No [x]  NA    EQ-5D Patient Survey performed?   []  Yes []  No [x]  NA    COMMENTS:   Date of Assessment:    __01-02-2021                Site ID / Name : [id number]  SJM Patient ID / Site patient ID: _[id number]_______  Mercedes [name] LEADLESS PACEMAKER (LP) ASSESSMENT  AND PROGRAMMING      VISIT TYPE:  []  Implant []  Pre-discharge                   []  2 wk  []  6 wk  []   3 mo  []  6 mo  []  12 mo  []  18 mo []  24 mo []  30 mo  []   36 mo                   []  42 mo  []  48 mo  []   54 mo  []   60 mo  []   66 mo  [x]   72 mo []  78 mo  []  84 mo                            []   90 mo  []  96 mo []  102 mo                  []  Unscheduled visit; IF unscheduled, check one;    []  In Clinic   []  Remote                  []  Other type visit; Specify: ____________________     Pacemaker Parameters               Initial Value    (before measurement)                   Final Value      (after measurements)                                                    Mode                                  - Check one                            [x]  V V I   []  V O O        []  OFF   []  V V I R     []  V V I R Passive   []  V V I R Calibration              []  Not recorded      [x]  V V I   []  V O O        []  OFF   []  V V I R     []  V V I R Passive   []  V V I R Calibration              []  Not recorded         Magnet Mode       [x]  ON               []  Not recorded   []  OFF      [x]  ON            []  Not recorded   []  OFF        Sensor Gain                       []  Not recorded           [x]  N/A                               []  Not recorded           [x]  N/A       Basic Rate         50       bpm     []  Not recorded                 50             bpm            []  Not recorded     Max sensor rate  Min-1         []  Not recorded   [x]  N/A                           Min-1         []  Not recorded   [x]  N/A     Pulse amplitude          2.0          V           []  Not recorded                          2.0        V         []  Not recorded     Pulse duration         0.4        ms     []  Not recorded                0.4         ms     []  Not recorded     R sensitivity             2.0      mV    []  Not recorded                2.0        mV    []  Not recorded        Rate hysteresis       -10         min-1           []  Not recorded    []  N/A                -10        min-1           []  Not recorded    []  N/A         Search hysteresis                             cycles     []  Not recorded []  N/A [x]  OFF                             cycles     []  Not recorded []  N/A [x]  OFF    Refractory period      250         ms     []  Not recorded                250          ms     []  Not recorded            Diagnostics                    Initial Value    (before measurement)                  Final Value      (after measurements)    Reset count  0      /  []  Not recorded                                                                  0       /  []  Not recorded    Pace counts                                 [id number] /  []  Not recorded                                                      [id number]     /  []  Not recorded    Sense Counts                [id number]  /  []  Not recorded                  [id number]     /  []  Not recorded    Cell capacity used                                     /  [x]  Not recorded                                       /  [x]  Not recorded       Test Values                     Value    Capture threshold (pulse duration at 0.4 ms)           0.75       V    []  Not recorded    []  Unable to obtain in a capture threshold *       R-Wave amplitude        >12.0       mV    []  Not recorded    []  Unable to obtain R-wave amplitude *     Battery Voltage        3.25             V    []  Not recorded   Impedence                                ?          [x]  Not recorded   Estimated time to RRT          0           [x]  Months                       []  Years          []  Not recorded   Note: *  Complete Comments Sections  During the assessment(s),did the Nanostim LP have episodes of loss of capture and / or loss    of sensing that would likely cause symptomatic effects?       []  YES   [x]   NO  II. COMMENTS:

## 2020-09-04 LAB — CBC
HCT: 32.1 % — ABNORMAL LOW (ref 36.0–46.0)
Hemoglobin: 9.6 g/dL — ABNORMAL LOW (ref 12.0–15.0)
MCH: 24.4 pg — ABNORMAL LOW (ref 26.0–34.0)
MCHC: 29.9 g/dL — ABNORMAL LOW (ref 30.0–36.0)
MCV: 81.5 fL (ref 80.0–100.0)
Platelets: 484 10*3/uL — ABNORMAL HIGH (ref 150–400)
RBC: 3.94 MIL/uL (ref 3.87–5.11)
RDW: 16.5 % — ABNORMAL HIGH (ref 11.5–15.5)
WBC: 8.3 10*3/uL (ref 4.0–10.5)
nRBC: 0 % (ref 0.0–0.2)

## 2020-09-04 NOTE — Progress Notes (Addendum)
[facility] [place]  Subjective  - No new complaints   Objective 125/64 (!) 55 98.5 F (36.9 C) (Oral) 18 100%  Intake/Output Summary (Last 24 hours) at 09/04/2020 7654 Last data filed at 09/04/2020 0031 Gross per 24 hour  Intake 120 ml  Output 1100 ml  Net -980 ml   GT toe amp dressing clean and dry Motor intact toes and ankle right LE Right leg incisions healing well, right groin without hematoma Lungs non labored breathing   Assessment/Planning: POD # 4 65 y.o. female is s/p R CFA endarterectomy and femoral to popliteal bypass with PTFE 12/28 and subsequent R GT amputation 3 Days Post-Op   Well perfused right LE with healing GT amputation site. Pending discharge to CIR verse SNF  Mosetta Pigeon 09/04/2020 9:21 AM --  Laboratory Lab Results: Recent Labs    09/03/20 0324 09/04/20 0112  WBC 9.3 8.3  HGB 9.2* 9.6*  HCT 29.1* 32.1*  PLT 490* 484*   BMET No results for input(s): NA, K, CL, CO2, GLUCOSE, BUN, CREATININE, CALCIUM in the last 72 hours.  COAG Lab Results  Component Value Date   INR 1.0 11/18/2019   INR 1.0 11/03/2019   INR 1.1 10/01/2019   No results found for: PTT   I have seen and evaluated the patient. I agree with the PA note as documented above.  65 year old female status post right common femoral endarterectomy with a femoropopliteal bypass and toe amp.  Right leg appears well perfused and awaiting CIR placement and insurance authorization.  Cephus Shelling, MD [facility] [place] Office: [phone]

## 2020-09-04 NOTE — Progress Notes (Signed)
Mobility Specialist: Progress Note   09/04/20 1432  Mobility  Activity Transferred to/from [facility];Transferred:  Chair to bed  Level of Assistance Minimal assist, patient does 75% or more  Assistive Device Front wheel walker  Distance Ambulated (ft) 3 ft  Mobility Response Tolerated well  Mobility performed by Mobility specialist  $Mobility charge 1 Mobility   Pt transferred to/from [facility] independent. Pt required verbal cues during transfer for proper set up. Pt transferred to/from [facility] 2x. Pt transferred from chair to bed per request. Pt minA to stand and contact guard during transfer.   [facility] Jasalyn Frysinger Mobility Specialist

## 2020-09-04 NOTE — Progress Notes (Signed)
Mobility Specialist: Progress Note   09/04/20 1227  Mobility  Activity Transferred:  Chair to bed  Level of Assistance Minimal assist, patient does 75% or more  Assistive Device Front wheel walker  Distance Ambulated (ft) 3 ft  Mobility Response Tolerated well  Mobility performed by Mobility specialist  Bed Position Chair  $Mobility charge 1 Mobility   Pre-Mobility: 56 HR Post-Mobility: 59 HR, 138/68 BP, 100% SpO2  Pt was minA to stand from EOB and contact guard during transfer. Pt's pain level has improved today from yesterday. Encouraged pt to continue transfers with staff.   [facility] Gwenneth Whiteman Mobility Specialist

## 2020-09-05 LAB — CBC
HCT: 32.6 % — ABNORMAL LOW (ref 36.0–46.0)
Hemoglobin: 10.1 g/dL — ABNORMAL LOW (ref 12.0–15.0)
MCH: 24.8 pg — ABNORMAL LOW (ref 26.0–34.0)
MCHC: 31 g/dL (ref 30.0–36.0)
MCV: 79.9 fL — ABNORMAL LOW (ref 80.0–100.0)
Platelets: 498 10*3/uL — ABNORMAL HIGH (ref 150–400)
RBC: 4.08 MIL/uL (ref 3.87–5.11)
RDW: 16.7 % — ABNORMAL HIGH (ref 11.5–15.5)
WBC: 8.4 10*3/uL (ref 4.0–10.5)
nRBC: 0 % (ref 0.0–0.2)

## 2020-09-05 NOTE — Progress Notes (Addendum)
[facility] [place]  Subjective  - No new complaints   Objective 122/74 (!) 117 98.6 F (37 C) (Oral) 16 100%  Intake/Output Summary (Last 24 hours) at 09/05/2020 0835 Last data filed at 09/04/2020 2140 Gross per 24 hour  Intake --  Output 700 ml  Net -700 ml    Right GT amp site healing well, skin macerated will leave open to air for a while until she ambulates than apply new dressing. Doppler signals intact right LE, foot warm well perfused Lungs non labored breathing  Assessment/Planning: 65 y.o.femaleis s/p R CFA endarterectomy and femoral to popliteal bypass with PTFE12/28and subsequent R GT amputation  Right LE incisions healing with good inflow Pending rehab CIR  Mosetta Pigeon 09/05/2020 8:35 AM --  Laboratory Lab Results: Recent Labs    09/04/20 0112 09/05/20 0215  WBC 8.3 8.4  HGB 9.6* 10.1*  HCT 32.1* 32.6*  PLT 484* 498*   BMET No results for input(s): NA, K, CL, CO2, GLUCOSE, BUN, CREATININE, CALCIUM in the last 72 hours.  COAG Lab Results  Component Value Date   INR 1.0 11/18/2019   INR 1.0 11/03/2019   INR 1.1 10/01/2019   No results found for: PTT   I have seen and evaluated the patient. I agree with the PA note as documented above. 65 year old female status post right common femoral endarterectomy with a femoropopliteal bypass and toe amp.  Brisk right DP signal.  Incisions loook good.  Toe amp looks good.  Awaiting CIR placement and insurance authorization.      Cephus Shelling, MD [facility] [place] Office: [phone]

## 2020-09-05 NOTE — Progress Notes (Signed)
Orthopedic Tech Progress Note Patient Details:  RAYYA YAGI 1956-02-21 [id number] Ordered patients shrinker Patient ID: [name], female   DOB: May 26, 1956, 65 y.o.   MRN: [id number]   Gerald Stabs 09/05/2020, 12:11 PM

## 2020-09-05 NOTE — Progress Notes (Signed)
Mobility Specialist: Progress Note   09/05/20 1210  Mobility  Activity Ambulated to bathroom  Level of Assistance Minimal assist, patient does 75% or more  Assistive Device Front wheel walker  Distance Ambulated (ft) 36 ft  Mobility Response Tolerated well  Mobility performed by Mobility specialist  Bed Position Chair  $Mobility charge 1 Mobility   Pre-Mobility: 63 HR Post-Mobility: 88 HR, 120/74 BP, 97% SpO2  Pt ambulated to BR with minA to stand and contact guard while ambulating. Pt asx during ambulation. Pt did experience posterior lean 1x during transfer but recovered quickly. Pt requesting to ambulate to the BR from now on with staff instead of using BSC, RN and NT notified.   [facility] Teran Knittle Mobility Specialist

## 2020-09-06 LAB — CBC
HCT: 31.7 % — ABNORMAL LOW (ref 36.0–46.0)
Hemoglobin: 9.9 g/dL — ABNORMAL LOW (ref 12.0–15.0)
MCH: 25.3 pg — ABNORMAL LOW (ref 26.0–34.0)
MCHC: 31.2 g/dL (ref 30.0–36.0)
MCV: 80.9 fL (ref 80.0–100.0)
Platelets: 518 10*3/uL — ABNORMAL HIGH (ref 150–400)
RBC: 3.92 MIL/uL (ref 3.87–5.11)
RDW: 17.2 % — ABNORMAL HIGH (ref 11.5–15.5)
WBC: 8.4 10*3/uL (ref 4.0–10.5)
nRBC: 0 % (ref 0.0–0.2)

## 2020-09-06 NOTE — Progress Notes (Signed)
Mobility Specialist: Progress Note   09/06/20 1219  Mobility  Activity Ambulated in hall  Level of Assistance Contact guard assist, steadying assist  Assistive Device Front wheel walker  Distance Ambulated (ft) 120 ft  Mobility Response Tolerated well  Mobility performed by Mobility specialist  Bed Position Chair  $Mobility charge 1 Mobility   Pre-Mobility: 67 HR Post-Mobility: 83 HR, 128/67 BP, 99% SpO2  Pt ambulated in hallway today. Pt is very excited about the progress she is making. Pt c/o pain in her R foot towards end of ambulation and requested pain medication, RN present. Pt back to chair after walk per request.   Cristal Deer Asa Fath Mobility Specialist

## 2020-09-06 NOTE — Progress Notes (Signed)
Physical Therapy Treatment Patient Details Name: Mercedes [name] MRN: [id number] DOB: September 03, 1955 Today's Date: 09/06/2020    History of Present Illness 65 year old female with L above-knee amputation in remote history.  Underwent Right common femoral, profundofemoral and SFA endarterectomy and Right common femoral to below-knee popliteal artery bypass 12/28. Now s/p Amputation right first toe with resection of metatarsal head on 08/31/20.  Cardiology to work patient up due to c/o chest pain and tightness with possible pacemaker surgery if indicated.    PT Comments    Pt continues to progress towards goals. Able to tolerate ambulation within the room using RW. Was able to maintain appropriate weightbearing status throughout. [employer] rep present in room at end of session to help with prosthetic issues. Current recommendations appropriate as pt very motivated to regain independence. Will continue to follow acutely.    Follow Up Recommendations  CIR     Equipment Recommendations  Rolling walker with 5" wheels;Wheelchair (measurements PT);Wheelchair cushion (measurements PT)    Recommendations for Other Services Rehab consult     Precautions / Restrictions Precautions Precautions: Fall Required Braces or Orthoses: Other Brace Other Brace: Darco shoe for right foot with heel weight beraing Restrictions Weight Bearing Restrictions: Yes RLE Weight Bearing: Partial weight bearing Other Position/Activity Restrictions: Per MD heel weight bearing.    Mobility  Bed Mobility Overal bed mobility: Needs Assistance Bed Mobility: Supine to Sit;Sit to Supine     Supine to sit: Supervision Sit to supine: Supervision   General bed mobility comments: Supervision for safety  Transfers Overall transfer level: Needs assistance Equipment used: Rolling walker (2 wheeled) Transfers: Sit to/from Stand Sit to Stand: From elevated surface;Min assist         General transfer comment: Min A for  lift assist and steadying to stand using RW.  Ambulation/Gait Ambulation/Gait assistance: Min guard Gait Distance (Feet): 20 Feet Assistive device: Rolling walker (2 wheeled) Gait Pattern/deviations: Step-to pattern Gait velocity: slow   General Gait Details: Unable to use prosthetic, however, tolerated gait in the room fairly well. reporting increased pain in back of thigh which limited mobility. Able to maintain appropriate WB precautions using RW.   Stairs             Wheelchair Mobility    Modified Rankin (Stroke Patients Only)       Balance Overall balance assessment: Needs assistance Sitting-balance support: No upper extremity supported Sitting balance-Leahy Scale: Good     Standing balance support: Bilateral upper extremity supported;Single extremity supported;During functional activity Standing balance-Leahy Scale: Poor Standing balance comment: Reliant on BUE support                            Cognition Arousal/Alertness: Awake/alert Behavior During Therapy: WFL for tasks assessed/performed Overall Cognitive Status: Within Functional Limits for tasks assessed                                        Exercises General Exercises - Lower Extremity Long Arc Quad: AROM;Right;10 reps;Seated    General Comments        Pertinent Vitals/Pain Pain Assessment: 0-10 Pain Score: 5  Pain Location: posterior R thigh Pain Descriptors / Indicators: Grimacing;Guarding Pain Intervention(s): Limited activity within patient's tolerance;Monitored during session;Repositioned    Home Living  Prior Function            PT Goals (current goals can now be found in the care plan section) Acute Rehab PT Goals Patient Stated Goal: Try to get home and start waking again. PT Goal Formulation: With patient Time For Goal Achievement: 09/16/20 Potential to Achieve Goals: Good Progress towards PT goals: Progressing  toward goals    Frequency    Min 3X/week      PT Plan Current plan remains appropriate    Co-evaluation              AM-PAC PT "6 Clicks" Mobility   Outcome Measure  Help needed turning from your back to your side while in a flat bed without using bedrails?: A Little Help needed moving from lying on your back to sitting on the side of a flat bed without using bedrails?: A Little Help needed moving to and from a bed to a chair (including a wheelchair)?: A Little Help needed standing up from a chair using your arms (e.g., wheelchair or bedside chair)?: A Little Help needed to walk in hospital room?: A Little Help needed climbing 3-5 steps with a railing? : Total 6 Click Score: 16    End of Session Equipment Utilized During Treatment: Gait belt Activity Tolerance: Patient tolerated treatment well Patient left: in chair;with call bell/phone within reach;Other (comment) (with [employer] rep present in room.) Nurse Communication: Mobility status PT Visit Diagnosis: Unsteadiness on feet (R26.81);Other abnormalities of gait and mobility (R26.89);Muscle weakness (generalized) (M62.81);Difficulty in walking, not elsewhere classified (R26.2);Pain Pain - Right/Left: Right Pain - part of body: Leg     Time: 2449-7530 PT Time Calculation (min) (ACUTE ONLY): 13 min  Charges:  $Gait Training: 8-22 mins                     Cindee Salt, DPT  Acute Rehabilitation Services  Pager: [phone] Office: [phone]    Lehman Prom 09/06/2020, 12:03 PM

## 2020-09-06 NOTE — Progress Notes (Signed)
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  Underwent Right common femoral, profundofemoral and SFA endarterectomy and Right common femoral to below-knee popliteal artery bypass 12/28. Now s/p Amputation right first toe with resection of metatarsal head on 08/31/20.  Cardiology to work patient up due to c/o chest pain and tightness with possible pacemaker surgery if indicated.   OT comments  Pt making good progress with functional goals. OT will continue to follow acutely to maximize level of function and safety  Follow Up Recommendations  CIR    Equipment Recommendations  None recommended by OT    Recommendations for Other Services      Precautions / Restrictions Precautions Precautions: Fall Precaution Comments: now has prosthetic shrinker Required Braces or Orthoses: Other Brace Other Brace: Darco shoe for right foot with heel weight beraing Restrictions Weight Bearing Restrictions: Yes RLE Weight Bearing: Partial weight bearing Other Position/Activity Restrictions: Per MD heel weight bearing.       Mobility Bed Mobility Overal bed mobility: Needs Assistance Bed Mobility: Sit to Supine       Sit to supine: Supervision   General bed mobility comments: Supervision for safety, pt in recliner upon arrival  Transfers Overall transfer level: Needs assistance Equipment used: Rolling walker (2 wheeled) Transfers: Sit to/from Stand Sit to Stand: From elevated surface;Min assist Stand pivot transfers: From elevated surface;Min guard       General transfer comment: Min A for lift assist and steadying to stand using RW.    Balance Overall balance assessment: Needs assistance Sitting-balance support: No upper extremity supported Sitting balance-Leahy Scale: Good     Standing balance support:  Bilateral upper extremity supported;Single extremity supported;During functional activity Standing balance-Leahy Scale: Poor                             ADL either performed or assessed with clinical judgement   ADL Overall ADL's : Needs assistance/impaired     Grooming: Wash/dry hands;Wash/dry face;Min guard;Standing       Lower Body Bathing: Minimal assistance;Sitting/lateral leans Lower Body Bathing Details (indicate cue type and reason): simulated seated in recliner     Lower Body Dressing: Min guard;Sitting/lateral leans   Toilet Transfer: Minimal assistance;RW;Min guard;Regular Toilet;Grab bars;Ambulation   Toileting- Clothing Manipulation and Hygiene: Minimal assistance;Sit to/from stand       Functional mobility during ADLs: Minimal assistance;Min guard;Rolling walker       Vision Baseline Vision/History: Wears glasses Patient Visual Report: No change from baseline     Perception     Praxis      Cognition Arousal/Alertness: Awake/alert Behavior During Therapy: WFL for tasks assessed/performed Overall Cognitive Status: Within Functional Limits for tasks assessed                                          Exercises     Shoulder Instructions       General Comments      Pertinent Vitals/ Pain       Pain Assessment: 0-10 Pain Score: 5  Pain Location: posterior R thigh Pain Descriptors / Indicators: Grimacing;Guarding Pain Intervention(s): Monitored during session;Repositioned  Home Living  Prior Functioning/Environment              Frequency  Min 2X/week        Progress Toward Goals  OT Goals(current goals can now be found in the care plan section)  Progress towards OT goals: Progressing toward goals  Acute Rehab OT Goals Patient Stated Goal: Try to get home and start waking again.  Plan Discharge plan remains appropriate    Co-evaluation                  AM-PAC OT "6 Clicks" Daily Activity     Outcome Measure   Help from another person eating meals?: None Help from another person taking care of personal grooming?: A Little Help from another person toileting, which includes using toliet, bedpan, or urinal?: A Little Help from another person bathing (including washing, rinsing, drying)?: A Little Help from another person to put on and taking off regular upper body clothing?: None Help from another person to put on and taking off regular lower body clothing?: A Little 6 Click Score: 20    End of Session Equipment Utilized During Treatment: Rolling walker;Gait belt  OT Visit Diagnosis: Pain Pain - Right/Left: Right Pain - part of body: Leg   Activity Tolerance Patient tolerated treatment well   Patient Left with call bell/phone within reach;in bed   Nurse Communication          Time: 6222-9798 OT Time Calculation (min): 27 min  Charges: OT General Charges $OT Visit: 1 Visit OT Treatments $Self Care/Home Management : 8-22 mins $Therapeutic Activity: 8-22 mins     Galen Manila 09/06/2020, 4:18 PM

## 2020-09-06 NOTE — Care Management Important Message (Signed)
Important Message  Patient Details  Name: Mercedes [name] MRN: [id number] Date of Birth: 1956/08/23   Medicare Important Message Given:  Yes     [name] 09/06/2020, 2:04 PM

## 2020-09-06 NOTE — Progress Notes (Signed)
Inpatient Rehab Admissions Coordinator:   Met with pt as she was ambulating in the hallway with mobility tech.  She's doing very well.  I have not gotten an update from insurance, and will continue to follow while they reach a determination.    Shann Medal, PT, DPT Admissions Coordinator [phone] 09/06/20  12:17 PM

## 2020-09-06 NOTE — Progress Notes (Addendum)
[facility] [place]  Subjective  - Comfortable, other than constipation   Objective 114/76 64 98.6 F (37 C) (Oral) 13 97%  Intake/Output Summary (Last 24 hours) at 09/06/2020 0715 Last data filed at 09/05/2020 1800 Gross per 24 hour  Intake 720 ml  Output --  Net 720 ml    Right GT amp site healing well, leg incisions and groin healing well foot warm well perfused Lungs non labored breathing   Assessment/Planning: 65 y.o.femaleis s/p R CFA endarterectomy and femoral to popliteal bypass with PTFE12/28and subsequent R GT amputation  Right LE incisions healing with good inflow Pending rehab CIR  Mosetta Pigeon 09/06/2020 7:15 AM --  Laboratory Lab Results: Recent Labs    09/05/20 0215 09/06/20 0117  WBC 8.4 8.4  HGB 10.1* 9.9*  HCT 32.6* 31.7*  PLT 498* 518*   BMET No results for input(s): NA, K, CL, CO2, GLUCOSE, BUN, CREATININE, CALCIUM in the last 72 hours.  COAG Lab Results  Component Value Date   INR 1.0 11/18/2019   INR 1.0 11/03/2019   INR 1.1 10/01/2019   No results found for: PTT  I have independently interviewed and examined patient and agree with PA assessment and plan above. Pending CIR.  Latitia Housewright C. Randie Heinz, MD [facility] [place] Office: [phone] Pager: [phone]

## 2020-09-07 LAB — CBC
HCT: 31.3 % — ABNORMAL LOW (ref 36.0–46.0)
Hemoglobin: 9.3 g/dL — ABNORMAL LOW (ref 12.0–15.0)
MCH: 24.3 pg — ABNORMAL LOW (ref 26.0–34.0)
MCHC: 29.7 g/dL — ABNORMAL LOW (ref 30.0–36.0)
MCV: 81.7 fL (ref 80.0–100.0)
Platelets: 453 10*3/uL — ABNORMAL HIGH (ref 150–400)
RBC: 3.83 MIL/uL — ABNORMAL LOW (ref 3.87–5.11)
RDW: 17.3 % — ABNORMAL HIGH (ref 11.5–15.5)
WBC: 7.1 10*3/uL (ref 4.0–10.5)
nRBC: 0 % (ref 0.0–0.2)

## 2020-09-07 NOTE — Progress Notes (Addendum)
  Progress Note    09/07/2020 7:11 AM  Subjective:  Foot is sore. No CP or SOB. +BM yesterday   Vitals:   09/06/20 2332 09/07/20 0503  BP: 114/60 122/68  Pulse: 60 64  Resp: 16 15  Temp: 98.4 F (36.9 C) 98.6 F (37 C)  SpO2: 95% 95%    Physical Exam: Cardiac:  RRR Lungs:  nonlabored Incisions:  Right great toe incision is well approximated without signs of infection. Right groin and lower leg incisions continue to heal nicely Extremities:  Foot warm with intact sensation and motor function. Brisk AT Doppler signal. + PT and peroneal signals   CBC    Component Value Date/Time   WBC 7.1 09/07/2020 0056   RBC 3.83 (L) 09/07/2020 0056   HGB 9.3 (L) 09/07/2020 0056   HGB 14.2 10/09/2018 1125   HCT 31.3 (L) 09/07/2020 0056   HCT 44.6 10/09/2018 1125   PLT 453 (H) 09/07/2020 0056   PLT 289 10/09/2018 1125   MCV 81.7 09/07/2020 0056   MCV 79 10/09/2018 1125   MCH 24.3 (L) 09/07/2020 0056   MCHC 29.7 (L) 09/07/2020 0056   RDW 17.3 (H) 09/07/2020 0056   RDW 14.8 10/09/2018 1125   LYMPHSABS 1.7 08/27/2020 0056   MONOABS 0.4 08/27/2020 0056   EOSABS 0.4 08/27/2020 0056   BASOSABS 0.0 08/27/2020 0056    BMET    Component Value Date/Time   NA 133 (L) 09/01/2020 0829   NA 135 10/09/2018 1125   K 4.0 09/01/2020 0829   CL 99 09/01/2020 0829   CO2 25 09/01/2020 0829   GLUCOSE 103 (H) 09/01/2020 0829   BUN 18 09/01/2020 0829   BUN 10 10/09/2018 1125   CREATININE 0.73 09/01/2020 0829   CREATININE 1.13 (H) 08/03/2016 1440   CALCIUM 9.0 09/01/2020 0829   GFRNONAA >60 09/01/2020 0829   GFRAA >60 11/24/2019 0620     Intake/Output Summary (Last 24 hours) at 09/07/2020 0711 Last data filed at 09/07/2020 0126 Gross per 24 hour  Intake 0 ml  Output --  Net 0 ml    HOSPITAL MEDICATIONS Scheduled Meds: . apixaban  5 mg Oral BID  . aspirin EC  81 mg Oral Daily  . docusate sodium  100 mg Oral Daily  . ezetimibe  10 mg Oral Daily  . feeding supplement  237 mL Oral TID  BM  . isosorbide mononitrate  30 mg Oral Daily  . metoprolol succinate  12.5 mg Oral Daily  . multivitamin with minerals  1 tablet Oral Daily  . pantoprazole  40 mg Oral Daily  . rosuvastatin  40 mg Oral QHS  . sodium chloride flush  3 mL Intravenous Q12H   Continuous Infusions: . sodium chloride    . magnesium sulfate bolus IVPB     PRN Meds:.sodium chloride, acetaminophen, alum & mag hydroxide-simeth, guaiFENesin-dextromethorphan, hydrALAZINE, HYDROmorphone (DILAUDID) injection, labetalol, magnesium sulfate bolus IVPB, nitroGLYCERIN, ondansetron (ZOFRAN) IV, oxyCODONE-acetaminophen, phenol, potassium chloride, sodium chloride flush, sodium phosphate  Assessment/plan:  65 y.o.femaleis s/p R CFA endarterectomy and femoral to popliteal bypass with PTFE12/28and subsequent R GT amputation (POD 7)  Right LE incisions healing with good inflow Pending rehab CIR        Wendi Maya, PA-C [facility] [phone] 09/07/2020  7:11 AM   I have independently interviewed and examined patient and agree with PA assessment and plan above. Pending CIR.  Dreden Rivere C. Randie Heinz, MD [facility] [place] Office: [phone] Pager: [phone]

## 2020-09-07 NOTE — Progress Notes (Signed)
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  Underwent Right common femoral, profundofemoral and SFA endarterectomy and Right common femoral to below-knee popliteal artery bypass 12/28. Now s/p Amputation right first toe with resection of metatarsal head on 08/31/20.  Cardiology to work patient up due to c/o chest pain and tightness with possible pacemaker surgery if indicated.    PT Comments    Pt supine in bed on arrival.  Pt in bed with shrinker donned and due to use able to donn LLE prosthesis with cues for correct placement and application.  Pt with LLE prosthesis on and R darco shoe does present with a limb length difference.  Pt noted with weakness in LLE to manage and utilize LLE prosthesis.  Increased assistance to ambulate with prosthesis.  Continue to recommend aggressive rehab in a post acute setting to maximize functional gains before returning to home.     Follow Up Recommendations  CIR     Equipment Recommendations  Rolling walker with 5" wheels;Wheelchair (measurements PT);Wheelchair cushion (measurements PT)    Recommendations for Other Services       Precautions / Restrictions Precautions Precautions: Fall Precaution Comments: now has prosthetic shrinker Required Braces or Orthoses: Other Brace Other Brace: Darco shoe for right foot with heel weight bearing Restrictions Weight Bearing Restrictions: Yes RLE Weight Bearing: Partial weight bearing Other Position/Activity Restrictions: Per MD heel weight bearing.    Mobility  Bed Mobility Overal bed mobility: Modified Independent Bed Mobility: Supine to Sit Rolling: Modified independent (Device/Increase time)   Supine to sit: Modified independent (Device/Increase time)        Transfers Overall transfer level: Needs  assistance Equipment used: Rolling walker (2 wheeled) Transfers: Sit to/from Stand Sit to Stand: From elevated surface;Min assist         General transfer comment: Cues for hand placement and lift assistance.  Pt with poor balance with use of LLE prosthesis this session.  Noticeable limb length difference with darco shoe on R.  Makes it hard to maintain balance.  Pt presents with weakness in LLE to manage L prosthesis.  Ambulation/Gait Ambulation/Gait assistance: Min assist Gait Distance (Feet): 40 Feet Assistive device: Rolling walker (2 wheeled) Gait Pattern/deviations: Step-to pattern;Trunk flexed;Narrow base of support;Decreased stride length Gait velocity: slow   General Gait Details: Pt walking with Heel on prosthesis to match heel weight bearing on the R side and compensate for limb difference.  Pt very weak on LLE to manage prosthesis and requiring increased assistance with balance as she progressed LLE forward.  Pt able to maintain RLE weight bearing well in darco shoe.  Pt's distance limited due to fatigue in LEs and UEs.   Stairs             Wheelchair Mobility    Modified Rankin (Stroke Patients Only)       Balance Overall balance assessment: Needs assistance Sitting-balance support: No upper extremity supported Sitting balance-Leahy Scale: Good       Standing balance-Leahy Scale: Poor                              Cognition Arousal/Alertness: Awake/alert Behavior During Therapy: WFL for tasks assessed/performed Overall Cognitive Status: Within Functional Limits for tasks assessed  Exercises      General Comments        Pertinent Vitals/Pain Pain Assessment: 0-10 Pain Score: 5  Pain Location: LLE with prosthetic use. Pain Descriptors / Indicators: Grimacing;Guarding Pain Intervention(s): Monitored during session;Repositioned    Home Living                       Prior Function            PT Goals (current goals can now be found in the care plan section) Acute Rehab PT Goals Patient Stated Goal: Try to get home and start waking again. Potential to Achieve Goals: Good Additional Goals Additional Goal #1: Don L LE prosthetic to assist in transfer to wheelchair Progress towards PT goals: Progressing toward goals    Frequency    Min 3X/week      PT Plan Current plan remains appropriate    Co-evaluation              AM-PAC PT "6 Clicks" Mobility   Outcome Measure  Help needed turning from your back to your side while in a flat bed without using bedrails?: None Help needed moving from lying on your back to sitting on the side of a flat bed without using bedrails?: None Help needed moving to and from a bed to a chair (including a wheelchair)?: A Little Help needed standing up from a chair using your arms (e.g., wheelchair or bedside chair)?: A Little Help needed to walk in hospital room?: A Little Help needed climbing 3-5 steps with a railing? : Total 6 Click Score: 18    End of Session Equipment Utilized During Treatment: Gait belt Activity Tolerance: Patient limited by fatigue Patient left: in chair;with call bell/phone within reach Nurse Communication: Mobility status PT Visit Diagnosis: Unsteadiness on feet (R26.81);Other abnormalities of gait and mobility (R26.89);Muscle weakness (generalized) (M62.81);Difficulty in walking, not elsewhere classified (R26.2);Pain Pain - Right/Left: Right Pain - part of body: Leg     Time: 1224-8250 PT Time Calculation (min) (ACUTE ONLY): 25 min  Charges:  $Gait Training: 8-22 mins $Therapeutic Activity: 8-22 mins                     Bonney Leitz , PTA Acute Rehabilitation Services Pager [phone] Office [phone]     [name] 09/07/2020, 12:33 PM

## 2020-09-07 NOTE — Plan of Care (Signed)
°  Problem: Clinical Measurements: °Goal: Respiratory complications will improve °Outcome: Progressing °Goal: Cardiovascular complication will be avoided °Outcome: Progressing °  °Problem: Activity: °Goal: Risk for activity intolerance will decrease °Outcome: Progressing °  °

## 2020-09-07 NOTE — Progress Notes (Signed)
Nutrition Follow Up  DOCUMENTATION CODES:   Severe malnutrition in context of chronic illness,Underweight  INTERVENTION:    Ensure Enlive po TID, each supplement provides 350 kcal and 20 grams of protein  MVI daily   NUTRITION DIAGNOSIS:   Severe Malnutrition related to chronic illness (PVD s/p amputations) as evidenced by severe fat depletion,severe muscle depletion.  Ongoing  GOAL:   Patient will meet greater than or equal to 90% of their needs   Progressing   MONITOR:   PO intake,Supplement acceptance,Weight trends,Labs,I & O's  REASON FOR ASSESSMENT:   LOS    ASSESSMENT:   Patient with PMH significant for essential HTN, secondary cardiomyopathy, PVD s/p L FP, RA, SLE, CAD s/p CABG, DM, CKD II, s/p L AKA, and depression. Presents this admission with gangrene R toes.  12/28- R SFA endarterectomy, R fem-pop bypass 01/04- R great toe amputation  Pt reports appetite declined slightly over the last few days given constipation. This has since improved with recent BM results. Last four meal completions charted as 75%, 100%, 95%, and 100%. Stopped taking Ensure over the last day given "bubbly" feeling in her stomach but plans to start taking again today.   Admission weight: 40.4 kg No current weights obtained   Medications: colace Labs: Na 133 (L) CBG 95-100  Diet Order:   Diet Order            Diet regular Room service appropriate? Yes with Assist; Fluid consistency: Thin  Diet effective now                 EDUCATION NEEDS:   Education needs have been addressed  Skin:  Skin Assessment: Skin Integrity Issues: Skin Integrity Issues:: Incisions Incisions: R leg, R foot, R groin  Last BM:  1/10  Height:   Ht Readings from Last 1 Encounters:  08/23/20 5\' 5"  (1.651 m)    Weight:   Wt Readings from Last 1 Encounters:  08/23/20 40.4 kg    BMI:  Body mass index is 14.81 kg/m.  Estimated Nutritional Needs:   Kcal:  1300-1500 kcal  Protein:   65-80 grams  Fluid:  >/= 1.3 L/day  08/25/20 RD, LDN Clinical Nutrition Pager listed in [facility]

## 2020-09-07 NOTE — Progress Notes (Signed)
Mobility Specialist: Progress Note   09/07/20 1536  Mobility  Activity Ambulated in room  Level of Assistance Independent after set-up  Assistive Device Front wheel walker  Distance Ambulated (ft) 30 ft  Mobility Response Tolerated well  Mobility performed by Mobility specialist  $Mobility charge 1 Mobility   Pre-Mobility: 66 HR Post-Mobility: 68 HR, 129/69 BP, 96% SpO2  Pt ambulated using prosthesis today. Pt ambulated with slow steady gait. Pt still getting used to using prosthesis but is progressing. Pt back to bed per request.   Cristal Deer Jaelani Posa Mobility Specialist

## 2020-09-07 NOTE — Plan of Care (Signed)
  Problem: Health Behavior/Discharge Planning: Goal: Ability to manage health-related needs will improve Outcome: Progressing   

## 2020-09-07 NOTE — Progress Notes (Signed)
Inpatient Rehab Admissions Coordinator:   Received notification that insurance has denied request for CIR.  Met with patient at the bedside to give her an update and she states she would not d/c to a SNF and prefers home.  Spoke to [employer], RN CM, to let her know.  Will sign off at this time.   Shann Medal, PT, DPT Admissions Coordinator [phone] 09/07/20  1:55 PM

## 2020-09-08 ENCOUNTER — Other Ambulatory Visit (HOSPITAL_COMMUNITY): Payer: Self-pay | Admitting: Physician Assistant

## 2020-09-08 LAB — CBC
HCT: 30 % — ABNORMAL LOW (ref 36.0–46.0)
Hemoglobin: 9.4 g/dL — ABNORMAL LOW (ref 12.0–15.0)
MCH: 25.3 pg — ABNORMAL LOW (ref 26.0–34.0)
MCHC: 31.3 g/dL (ref 30.0–36.0)
MCV: 80.6 fL (ref 80.0–100.0)
Platelets: 473 10*3/uL — ABNORMAL HIGH (ref 150–400)
RBC: 3.72 MIL/uL — ABNORMAL LOW (ref 3.87–5.11)
RDW: 17.3 % — ABNORMAL HIGH (ref 11.5–15.5)
WBC: 6.2 10*3/uL (ref 4.0–10.5)
nRBC: 0 % (ref 0.0–0.2)

## 2020-09-08 MED ORDER — EZETIMIBE 10 MG PO TABS: 10.0000 mg | ORAL_TABLET | Freq: Every day | ORAL | 3 refills | Status: DC

## 2020-09-08 MED ORDER — HYDROCODONE-ACETAMINOPHEN 5-325 MG PO TABS: 1.0000 | ORAL_TABLET | ORAL | 0 refills | Status: DC | PRN

## 2020-09-08 MED FILL — HYDROCODON-APAP 5-325: 5-325 | 4 days supply | Qty: 20 | Fill #0

## 2020-09-08 MED FILL — EZETIMIBE 10 MG TABS: 10 | 30 days supply | Qty: 30 | Fill #0

## 2020-09-08 NOTE — Progress Notes (Addendum)
Progress Note    09/08/2020 7:10 AM 8 Days Post-Op  Subjective:  No complaints   Vitals:   09/07/20 2015 09/08/20 0406  BP: 118/68 122/61  Pulse: 64 (!) 54  Resp: 16 20  Temp: 97.6 F (36.4 C) 97.8 F (36.6 C)  SpO2: 97% 98%    Physical Exam: Cardiac:  RRR Lungs:  nonlabored Incisions:  Right great toe incision is well approximated without signs of infection. No drainage. Right groin and lower leg incisions continue to heal nicely Extremities:  Foot warm with intact sensation and motor function.  CBC    Component Value Date/Time   WBC 6.2 09/08/2020 0109   RBC 3.72 (L) 09/08/2020 0109   HGB 9.4 (L) 09/08/2020 0109   HGB 14.2 10/09/2018 1125   HCT 30.0 (L) 09/08/2020 0109   HCT 44.6 10/09/2018 1125   PLT 473 (H) 09/08/2020 0109   PLT 289 10/09/2018 1125   MCV 80.6 09/08/2020 0109   MCV 79 10/09/2018 1125   MCH 25.3 (L) 09/08/2020 0109   MCHC 31.3 09/08/2020 0109   RDW 17.3 (H) 09/08/2020 0109   RDW 14.8 10/09/2018 1125   LYMPHSABS 1.7 08/27/2020 0056   MONOABS 0.4 08/27/2020 0056   EOSABS 0.4 08/27/2020 0056   BASOSABS 0.0 08/27/2020 0056    BMET    Component Value Date/Time   NA 133 (L) 09/01/2020 0829   NA 135 10/09/2018 1125   K 4.0 09/01/2020 0829   CL 99 09/01/2020 0829   CO2 25 09/01/2020 0829   GLUCOSE 103 (H) 09/01/2020 0829   BUN 18 09/01/2020 0829   BUN 10 10/09/2018 1125   CREATININE 0.73 09/01/2020 0829   CREATININE 1.13 (H) 08/03/2016 1440   CALCIUM 9.0 09/01/2020 0829   GFRNONAA >60 09/01/2020 0829   GFRAA >60 11/24/2019 0620     Intake/Output Summary (Last 24 hours) at 09/08/2020 0710 Last data filed at 09/07/2020 2323 Gross per 24 hour  Intake 240 ml  Output --  Net 240 ml    HOSPITAL MEDICATIONS Scheduled Meds: . apixaban  5 mg Oral BID  . aspirin EC  81 mg Oral Daily  . docusate sodium  100 mg Oral Daily  . ezetimibe  10 mg Oral Daily  . feeding supplement  237 mL Oral TID BM  . isosorbide mononitrate  30 mg Oral  Daily  . metoprolol succinate  12.5 mg Oral Daily  . multivitamin with minerals  1 tablet Oral Daily  . pantoprazole  40 mg Oral Daily  . rosuvastatin  40 mg Oral QHS  . sodium chloride flush  3 mL Intravenous Q12H   Continuous Infusions: . sodium chloride    . magnesium sulfate bolus IVPB     PRN Meds:.sodium chloride, acetaminophen, alum & mag hydroxide-simeth, guaiFENesin-dextromethorphan, hydrALAZINE, HYDROmorphone (DILAUDID) injection, labetalol, magnesium sulfate bolus IVPB, nitroGLYCERIN, ondansetron (ZOFRAN) IV, oxyCODONE-acetaminophen, phenol, potassium chloride, sodium chloride flush, sodium phosphate  Assessment:     65 y.o. female is s/p R CFA endarterectomy and femoral to popliteal bypass with PTFE 12/28 and subsequent R GT amputation (POD 8)   Right LE incisions healing with good inflow Did not meet criteria for CIR.  Plan: -Home presumably with HHPT     Mercedes Maya, PA-C [facility] [phone] 09/08/2020  7:10 AM   I have independently interviewed and examined patient and agree with PA assessment and plan above. Ok for d/c home with hhpt. Doing well ambulating with walker.   Mercedes [name] C. Randie Heinz, MD [facility] [place] Office: [phone] Pager: [phone]

## 2020-09-08 NOTE — Discharge Summary (Signed)
Discharge Summary     Mercedes [name] [birth year] 65 y.o. female  [id number]  Admission Date: 08/23/2020  Discharge Date: 09/08/2020  Physician: Dr. Lemar Livings  Admission Diagnosis: PAD (peripheral artery disease) (HCC) [I73.9]  HPI:   This is a 65 y.o. female had previously undergone left lower extremity bypass with subsequent above-knee amputation.  She underwent angiogram the day prior to this procedure which demonstrated occluded common femoral artery on the right as well as occluded SFA with reconstitution of diseased above-knee popliteal artery with single-vessel runoff via the peroneal artery in line from what appeared to be a healthier below-knee popliteal artery. She was scheduled for arteriogram.  Hospital Course:  The patient taken to the operating room on 08/24/2020 and underwent: Right femoral to below knee popliteal artery bypass with PTFE graft. Right common femoral, profundofemoral and SFA endarterectomy also performed. She had undergone aortogram with RLE angiogram for critical RLE ischemia on 08/23/2020. She was counseled regarding revascularization and admitted to the hospital.  Findings: Common femoral artery was occluded.  This was significantly thickened and calcified.  After endarterectomy extending down to the profunda femoris artery we were able to establish strong backbleeding.  We also performed endarterectomy of the SFA did not establish any backbleeding.  We performed endarterectomy of the entire common femoral artery up onto the inguinal ligament did have a very strong inflow at this time.  Below the knee the popliteal artery was thickened the tibioperoneal trunk was calcified as was the intertibial artery I elected not to clamp these vessels a tourniquet was used.  At completion of bypass there was a good signal in the tibioperoneal trunk that was graft dependent and also a signal of the peroneal artery that was graft dependent although this was  monophasic.  The pt tolerated the procedure well and was transported to the PACU in good condition.   By POD 1, her vital signs are stable and she was afebrile.  Physical therapy and Occupational Therapy consults were carried out.  Rehab admission coordinator was also counseled regarding possible CIR placement.  On postoperative day 2 her hemoglobin was relatively stable and her apixaban was restarted.  Aspirin and statin therapy continued.  On postoperative day 3, she commented about right great toe pain and ischemic changes were noted.  Dr. Darrick Penna evaluated her toe and explained she may ultimately need amputation.  She PT and remained stable.  On August 31, 2020 she was taken to the operating room and underwent right great toe amputation.  The following day she reported feeling tightness in her chest with lightheadedness.  She also stated that her pacemaker generator was end-of-life.  Cardiology was consulted.  Electrophysiologist was also consulted.  Apixaban was held and she was placed on heparin infusion.  She underwent carotid artery ultrasound with bilateral ICA stenosis in the 1 to 39% stenosis range.  2D echocardiogram.  She underwent Myoview on September 02, 2020.  This was poor quality but overall there was no evidence of ischemia.  Her medications were adjusted.  She had no further chest pain, chest tightness.  The remainder of the hospital course consisted of increasing mobilization and increasing intake of solids without difficulty.  CBC    Component Value Date/Time   WBC 6.2 09/08/2020 0109   RBC 3.72 (L) 09/08/2020 0109   HGB 9.4 (L) 09/08/2020 0109   HGB 14.2 10/09/2018 1125   HCT 30.0 (L) 09/08/2020 0109   HCT 44.6 10/09/2018 1125   PLT 473 (H) 09/08/2020 0109  PLT 289 10/09/2018 1125   MCV 80.6 09/08/2020 0109   MCV 79 10/09/2018 1125   MCH 25.3 (L) 09/08/2020 0109   MCHC 31.3 09/08/2020 0109   RDW 17.3 (H) 09/08/2020 0109   RDW 14.8 10/09/2018 1125   LYMPHSABS 1.7  08/27/2020 0056   MONOABS 0.4 08/27/2020 0056   EOSABS 0.4 08/27/2020 0056   BASOSABS 0.0 08/27/2020 0056    BMET    Component Value Date/Time   NA 133 (L) 09/01/2020 0829   NA 135 10/09/2018 1125   K 4.0 09/01/2020 0829   CL 99 09/01/2020 0829   CO2 25 09/01/2020 0829   GLUCOSE 103 (H) 09/01/2020 0829   BUN 18 09/01/2020 0829   BUN 10 10/09/2018 1125   CREATININE 0.73 09/01/2020 0829   CREATININE 1.13 (H) 08/03/2016 1440   CALCIUM 9.0 09/01/2020 0829   GFRNONAA >60 09/01/2020 0829   GFRAA >60 11/24/2019 0620     Discharge Instructions    Discharge patient   Complete by: As directed    HHPT   Discharge disposition: 06-Home-Health Care Svc   Discharge patient date: 09/08/2020   Face-to-face encounter (required for Medicare/Medicaid patients)   Complete by: As directed    I Milinda Antis certify that this patient is under my care and that I, or a nurse practitioner or physician's assistant working with me, had a face-to-face encounter that meets the physician face-to-face encounter requirements with this patient on 09/08/2020. The encounter with the patient was in whole, or in part for the following medical condition(s) which is the primary reason for home health care (List medical condition): PAD, amputation   The encounter with the patient was in whole, or in part, for the following medical condition, which is the primary reason for home health care: RLE toe amputation, fem-pop bypass, LLE amputee   I certify that, based on my findings, the following services are medically necessary home health services: Physical therapy   Reason for Medically Necessary Home Health Services: Therapy- Investment banker, operational, Patent examiner   My clinical findings support the need for the above services: Unsafe ambulation due to balance issues   Further, I certify that my clinical findings support that this patient is homebound due to: Unsafe ambulation due to balance issues   Home  Health   Complete by: As directed    To provide the following care/treatments: PT      Discharge Diagnosis:  PAD (peripheral artery disease) (HCC) [I73.9]  Secondary Diagnosis: Patient Active Problem List   Diagnosis Date Noted  . Depression 12/15/2019  . Non-healing surgical wound 10/30/2019  . Stump pain (HCC) 10/21/2019  . S/P AKA (above knee amputation), left (HCC), 10/01/19 10/20/2019  . Femoral-popliteal bypass graft occlusion, left (HCC) 09/25/2019  . Peripheral arterial disease (HCC) 09/25/2019  . Protein-calorie malnutrition, severe 05/15/2019  . Critical lower limb ischemia (HCC) 05/09/2019  . Dyspnea 02/01/2019  . Weight loss, unintentional 02/01/2019  . Epigastric pain 02/01/2019  . 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 Acute renal failure superimposed on stage 2 chronic kidney disease (HCC) 10/20/2018  . Diabetes (HCC) 04/01/2017  . Hyperthyroidism 02/21/2017  . Low mean corpuscular volume (MCV) 09/30/2016  . Coronary artery disease involving native heart without angina pectoris   . Hx of CABG 07/17/2016  . Paroxysmal atrial fibrillation (HCC) 07/12/2016  . NSTEMI (non-ST elevated myocardial infarction) (HCC) 07/11/2016  . History of arterial bypass of lower extremity 12/30/2015  . Claudication of right lower extremity (HCC) 12/30/2015  .  Phantom limb pain-Left great toe 04/29/2015  . Seizure (HCC)   . Atrial flutter with rapid ventricular response vs. SVT 10/27/2014    Class: Acute  . Atherosclerosis of native artery of left lower extremity with rest pain (HCC) 12/18/2013  . PAD (peripheral artery disease) (HCC) 12/02/2013  . SLE (systemic lupus erythematosus) (HCC)   . CAD - S/P Takotsubo MI in 2000, CFX DES Sept 2014 05/25/2013  . Acute MI, inferior wall (HCC) 05/19/2013  . 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 Tachycardia-bradycardia (HCC) 03/09/2010  . PVD- s/p Lt [facility] April 2015 10/29/2009  . Dyslipidemia 08/26/2009  . Essential hypertension 08/26/2009  .  CORONARY ATHEROSCLEROSIS NATIVE CORONARY ARTERY 08/26/2009  . 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 Rheumatoid arthritis (HCC) 08/26/2009   Past Medical History:  Diagnosis Date  . Abnormal LFTs    a. in the past when on Lipitor  . Anemia    as a young woman  . Anxiety   . Arnold-Chiari malformation, type I (HCC)    MRI brain 02/2010  . CAD (coronary artery disease)    a. NSTEMI 07/2009: LHC - D2 40%, LAD irreg., EF 50% with apical AK (tako-tsubo CM);  b. Inf STEMI (04/2013): Tx Promus DES to CFX;  c. 08/2013 Lexi CL: No ischemia, dist ant/ap/inf-ap infarct, EF  d. 06/2016 NSTEMI with LM disease: s/p CABG on 07/17/2016 w/ LIMA-LAD, SVG-OM, and SVG-dRCA.  [name] DEPRESSION   . Dizziness    ? CVA 01/2010 - carotid dopplers with no ICA stenosis  . GERD (gastroesophageal reflux disease)   . Headache   . 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 HYPERLIPIDEMIA   . HYPERTENSION   . MI (myocardial infarction) (HCC)   . PAD (peripheral artery disease) (HCC)    s/p L fem pop 11/2013 in setting of 1st toe osteo/gangrene  . Paroxysmal atrial fibrillation (HCC)    a. anticoagulated with Eliquis 10/2014  . 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 RA (rheumatoid arthritis) (HCC)    prior tx by Dr. Dareen Piano  . Sjogren's syndrome (HCC) 06/02/13   pt denies this (12/01/13)  . SLE (systemic lupus erythematosus) (HCC) dx 05/2013   follows with rheum Dareen Piano)  . Tachy-brady syndrome (HCC)    a. s/p STJ Leadless pacemaker 11-04-2014 Dr Johney Frame  . 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 Wears dentures   . Wears glasses      Allergies as of 09/08/2020      Reactions   Brilinta [ticagrelor] Anaphylaxis, Shortness Of Breath, Other (See Comments)   Also, chest tightness   Latex Rash   Tape Rash      Medication List    STOP taking these medications    oxyCODONE-acetaminophen 5-325 MG tablet Commonly known as: Percocet   traMADol 50 MG tablet Commonly known as: Ultram     TAKE these medications   acetaminophen 500 MG tablet Commonly known as: TYLENOL Take 500-1,000 mg by mouth every 8 (eight) hours as needed for mild pain or headache.   Eliquis 5 MG Tabs tablet Generic drug: apixaban TAKE 1 TABLET BY MOUTH TWICE DAILY What changed: how much to take   Ensure Max Protein Liqd Take 8 oz by mouth daily.   ezetimibe 10 MG tablet Commonly known as: Zetia Take 1 tablet (10 mg total) by mouth daily.   HYDROcodone-acetaminophen 5-325 MG tablet Commonly known  as: NORCO/VICODIN Take 1 tablet by mouth every 4 (four) hours as needed for moderate pain.   metoprolol tartrate 25 MG tablet Commonly known as: LOPRESSOR Take 0.5 tablets (12.5 mg total) by mouth 2 (two) times daily.   nitroGLYCERIN 0.4 MG SL tablet Commonly known as: NITROSTAT Place 0.4 mg under the tongue every 4 (four) hours as needed.   rosuvastatin 40 MG tablet Commonly known as: CRESTOR Take 40 mg by mouth at bedtime.       Discharge Instructions: [facility] Discharge instructions Lower Extremity Bypass Surgery  Please refer to the following instruction for your post-procedure care. Your surgeon or physician assistant will discuss any changes with you.  Activity  You are encouraged to walk as much as you can. You can slowly return to normal activities during the month after your surgery. Avoid strenuous activity and heavy lifting until your doctor tells you it's OK. Avoid activities such as vacuuming or swinging a golf club. Do not drive until your doctor give the OK and you are no longer taking prescription pain medications. It is also normal to have difficulty with sleep habits, eating and bowel movement after surgery. These will go away with time.  Bathing/Showering  You may shower after you go home. Do not soak in a  bathtub, hot tub, or swim until the incision heals completely.  Incision Care  Clean your incision with mild soap and water. Shower every day. Pat the area dry with a clean towel. You do not need a bandage unless otherwise instructed. Do not apply any ointments or creams to your incision. If you have open wounds you will be instructed how to care for them or a visiting nurse may be arranged for you. If you have staples or sutures along your incision they will be removed at your post-op appointment. You may have skin glue on your incision. Do not peel it off. It will come off on its own in about one week.  Wash the groin wound with soap and water daily and pat dry. (No tub bath-only shower)  Then put a dry gauze or washcloth in the groin to keep this area dry to help prevent wound infection.  Do this daily and as needed.  Do not use Vaseline or neosporin on your incisions.  Only use soap and water on your incisions and then protect and keep dry.  Diet  Resume your normal diet. There are no special food restrictions following this procedure. A low fat/ low cholesterol diet is recommended for all patients with vascular disease. In order to heal from your surgery, it is CRITICAL to get adequate nutrition. Your body requires vitamins, minerals, and protein. Vegetables are the best source of vitamins and minerals. Vegetables also provide the perfect balance of protein. Processed food has little nutritional value, so try to avoid this.  Medications  Resume taking all your medications unless your doctor or Physician Assistant tells you not to. If your incision is causing pain, you may take over-the-counter pain relievers such as acetaminophen (Tylenol). If you were prescribed a stronger pain medication, please aware these medication can cause nausea and constipation. Prevent nausea by taking the medication with a snack or meal. Avoid constipation by drinking plenty of fluids and eating foods with high amount  of fiber, such as fruits, vegetables, and grains. Take Colace 100 mg (an over-the-counter stool softener) twice a day as needed for constipation.  Do not take Tylenol if you are taking prescription  pain medications.  Follow Up  Our office will schedule a follow up appointment 2-3 weeks following discharge.  Please call us immediately for any of the following conditions  .Severe or worsening pain in your legs or feet while at rest or while walking .Increase pain, redness, warmth, or drainage (pus) from your incision site(s) . Fever of 101 degree or higher . The swelling in your leg with the bypass suddenly worsens and becomes more painful than when you were in the hospital . If you have been instructed to feel your graft pulse then you should do so every day. If you can no longer feel this pulse, call the office immediately. Not all patients are given this instruction. .  Leg swelling is common after leg bypass surgery.  The swelling should improve over a few months following surgery. To improve the swelling, you may elevate your legs above the level of your heart while you are sitting or resting. Your surgeon or physician assistant may ask you to apply an ACE wrap or wear compression (TED) stockings to help to reduce swelling.  Reduce your risk of vascular disease  Stop smoking. If you would like help call [facility] at 1-800-QUIT-NOW (480-047-5315) or [place] at (972) [phone].  . Manage your cholesterol . Maintain a desired weight . Control your diabetes weight . Control your diabetes . Keep your blood pressure down .  If you have any questions, please call the office at [phone]   Prescriptions given: 1.  Roxicet #20 No Refill   Disposition: home   Patient's condition: is Good  Follow up: 1. Dr. Randie Heinz in 3 weeks   Wendi Maya, PA-C [facility] [phone] 09/08/2020  2:43 PM  - For VQI Registry use ---   Post-op:  Wound infection:  No  Graft infection: No  Transfusion: No    If yes, 0 units given New Arrhythmia: No Ipsilateral amputation: Yes, [x ] Minor,  BKA,  AKA Discharge patency: [x ] Primary,  Primary assisted,  Secondary,  Occluded Patency judged by: [x ] Dopper only,  Palpable graft pulse,  Palpable distal pulse, [x ] ABI inc. > 0.15,  Duplex Discharge ABI: R 0.70, L amputation D/C Ambulatory Status: Ambulatory with Assistance  Complications: MI: No,  Troponin only,  EKG or Clinical CHF: No Resp failure:No,  Pneumonia,  Ventilator Chg in renal function: No,  Inc. Cr > 0.5,  Temp. Dialysis,   Permanent dialysis Stroke: No,  Minor,  Major Return to OR: No  Reason for return to OR:  Bleeding,  Infection,  Thrombosis,  Revision  Discharge medications: Statin use:  yes ASA use:  yes Plavix use:  no Beta blocker use: yes CCB use:  No ACEI use:   no ARB use:  no Coumadin use: no apixaban

## 2020-09-08 NOTE — Progress Notes (Signed)
Mobility Specialist: Progress Note   09/08/20 1136  Mobility  Activity Ambulated in hall  Level of Assistance Contact guard assist, steadying assist  Assistive Device Front wheel walker  Distance Ambulated (ft) 100 ft  Mobility Response Tolerated well  Mobility performed by Mobility specialist  $Mobility charge 1 Mobility   Pre-Mobility: 61 HR During Mobility: 85 HR Post-Mobility: 64 HR, 148/75 BP, 99% SpO2  Pt requested to ambulate without prosthesis today due to L LE soreness. Pt eager for discharge today.   [facility] [place] Mobility Specialist

## 2020-09-08 NOTE — Progress Notes (Signed)
Patient given discharge instructions medication list and follow up appointments. IV and tele were removed. Awaiting medication from [facility] pharmacy. Once arrived will discharge home as ordered. Transported to exit via wheel chair and hosptial staff. Patrick Sohm, Randall An RN

## 2020-09-08 NOTE — Progress Notes (Signed)
Occupational Therapy Treatment Patient Details Name: Mercedes [name] MRN: [id number] DOB: 04/27/1956 Today's Date: 09/08/2020    History of present illness 65 year old female with L above-knee amputation in remote history.  Underwent Right common femoral, profundofemoral and SFA endarterectomy and Right common femoral to below-knee popliteal artery bypass 12/28. Now s/p Amputation right first toe with resection of metatarsal head on 08/31/20.  Cardiology to work patient up due to c/o chest pain and tightness with possible pacemaker surgery if indicated.   OT comments  Pt continues with excellent participation, endorsing discomfort to distal end of L residual limb. Appears to be sensitivity from donning and wear of prosthetic. Received in bathroom on BSC, good safety with transition but continues to need up to min A for safety with transition to standing with RW (prosthetic off this date). Tolerated grooming tasks at sink with CGA for safety while standing. Extensive education on need for wear of R shoe for heeling and protection, discussed use of walker tray/showe seat, long handled tools. Recommendations continue to be appropriate for CIR/IPR level d/c, but plan is for d/c to home with [facility] consult. Pt will require 24hr S/A for safety at time of d.c, if hospitalization continue, OT will follow acutely to maximize indep and safety with ADL's.   Follow Up Recommendations  CIR    Equipment Recommendations   (indicates need for w/c)    Recommendations for Other Services      Precautions / Restrictions Precautions Precautions: Fall Precaution Comments: now has prosthetic shrinker Required Braces or Orthoses: Other Brace Other Brace: Darco shoe for right foot with heel weight bearing Restrictions Weight Bearing Restrictions: Yes RLE Weight Bearing: Partial weight bearing Other Position/Activity Restrictions: WB to heel per chart and noted in PT note       Mobility Bed Mobility Overal bed  mobility: Modified Independent                Transfers       Sit to Stand: From elevated surface;Min assist         General transfer comment: Cues for hand placement and weight shift for transition to standing. noted tendency for slight LOB to L    Balance                                           ADL either performed or assessed with clinical judgement   ADL       Grooming: Wash/dry hands;Wash/dry face;Min guard;Standing           Upper Body Dressing : Sitting;Set up       Toilet Transfer: Min Administrator Details (indicate cue type and reason): to and from commode and at EOB, demo's good safety with only need for min guard Toileting- Clothing Manipulation and Hygiene: Min guard;Sit to/from stand;Cueing for safety       Functional mobility during ADLs: Min guard;Rolling walker;Minimal assistance General ADL Comments: tolerated toilet transfers and EOB at bed level for mobility and transfers. improved tolerance only midlly limited by L residual limb discomfort from prosthetic wear     Vision       Perception     Praxis      Cognition Arousal/Alertness: Awake/alert Behavior During Therapy: WFL for tasks assessed/performed Overall Cognitive Status: Within Functional Limits for tasks assessed  Exercises     Shoulder Instructions       General Comments      Pertinent Vitals/ Pain       Pain Assessment: 0-10 Pain Score: 4  Faces Pain Scale: Hurts even more Pain Location: LLE residual limb Pain Descriptors / Indicators: Aching;Grimacing Pain Intervention(s): Repositioned;Relaxation;Monitored during session  Home Living                                          Prior Functioning/Environment              Frequency  Min 2X/week        Progress Toward Goals  OT Goals(current goals can now be found in the care plan section)   Progress towards OT goals: Progressing toward goals  Acute Rehab OT Goals Patient Stated Goal: to be as indep as possible OT Goal Formulation: With patient Time For Goal Achievement: 09/17/20 Potential to Achieve Goals: Good  Plan Discharge plan remains appropriate    Co-evaluation                 AM-PAC OT "6 Clicks" Daily Activity     Outcome Measure   Help from another person eating meals?: None Help from another person taking care of personal grooming?: A Little Help from another person toileting, which includes using toliet, bedpan, or urinal?: A Little Help from another person bathing (including washing, rinsing, drying)?: A Little Help from another person to put on and taking off regular upper body clothing?: None Help from another person to put on and taking off regular lower body clothing?: A Little 6 Click Score: 20    End of Session Equipment Utilized During Treatment: Rolling walker;Gait belt  OT Visit Diagnosis: Pain Pain - Right/Left: Left Pain - part of body: Leg   Activity Tolerance Patient tolerated treatment well   Patient Left in chair;with call bell/phone within reach   Nurse Communication Mobility status        Time: 5102-5852 OT Time Calculation (min): 39 min  Charges: OT General Charges $OT Visit: 1 Visit OT Treatments $Self Care/Home Management : 38-52 mins  Kyheem Bathgate OTR/L acute rehab services Office: [phone]  [employer] Dimple Bastyr 09/08/2020, 12:30 PM

## 2020-09-08 NOTE — TOC Transition Note (Addendum)
Transition of Care (TOC) - CM/SW Discharge Note Donn Pierini RN, BSN Transitions of Care Unit 4E- RN Case Manager See Treatment Team for direct phone #    Patient Details  Name: Mercedes [name] MRN: [id number] Date of Birth: 1955-10-07  Transition of Care [facility]) CM/SW Contact:  Darrold Span, RN Phone Number: 09/08/2020, 12:03 PM   Clinical Narrative:    Pt stable for transition home today, s/p R GT amputation and fem pop- from home with family support. Order placed for HHPT- CM spoke with pt at bedside- per conversation with pt she has a PCS aide that is through [place]- she also has DME in the home that includes shower bench, BSC, RW and quad cane, w/c (however w/c per pt is broken and does not feel it is safe)- choice offered to pt for [facility] Per CMS guidelines from medicare.gov website with star ratings (copy placed in shadow chart)- per pt she has used [facility], [facility] and [facility] in the past- pt would prefer to use [facility] if able with [facility] and then [facility] as backups.  Discussed with pt broken wheelchair- per chart review it looks like pt received her wheelchair in 04/2019- if this is correct then pt would not be eligible for insurance to cover a new wheelchair- will contact [employer] to confirm and to check and see if wheelchair can be repaired if not eligible to be replaced.   Per pt her brother will transport home, now has ramp at home, TOC to fill meds prior to discharge and deliver to bedside.   Call made to [employer] regarding wheelchair- awaiting return call. 1300- received return call from Lecreshia at [employer] regarding w/c- confirmed pt received w/c in 04/2019- and pt also has a balance with [employer]. Eyvonne Mechanic will place a service ticket request for someone to go out and look at w/c to see about repairing wheel.    Call made to [facility] with Frances Furbish for HHPT referral- referral has been accepted    Final next level of care: Home w Home Health Services Barriers to Discharge: No Barriers  Identified   Patient Goals and CMS Choice Patient states their goals for this hospitalization and ongoing recovery are:: return home CMS Medicare.gov Compare Post Acute Care list provided to:: Patient Choice offered to / list presented to : Patient  Discharge Placement               Home with [facility]        Discharge Plan and Services   Discharge Planning Services: CM Consult Post Acute Care Choice: Durable Medical Equipment,Home Health          DME Arranged: N/A DME Agency: NA       HH Arranged: PT HH Agency: [facility] Date [facility] Agency Contacted: 09/08/20 Time HH Agency Contacted: 1203 Representative spoke with at [facility] Agency: Kandee Keen  Social Determinants of Health (SDOH) Interventions     Readmission Risk Interventions Readmission Risk Prevention Plan 09/08/2020 10/06/2019 05/16/2019  Transportation Screening Complete Complete Complete  PCP or Specialist Appt within 5-7 Days Complete Complete Complete  Home Care Screening Complete Complete Complete  Medication Review (RN CM) Complete Complete Complete  Some recent data might be hidden

## 2020-09-10 DIAGNOSIS — I129 Hypertensive chronic kidney disease with stage 1 through stage 4 chronic kidney disease, or unspecified chronic kidney disease: Secondary | ICD-10-CM | POA: Diagnosis not present

## 2020-09-10 DIAGNOSIS — D631 Anemia in chronic kidney disease: Secondary | ICD-10-CM | POA: Diagnosis not present

## 2020-09-10 DIAGNOSIS — Z4781 Encounter for orthopedic aftercare following surgical amputation: Secondary | ICD-10-CM | POA: Diagnosis not present

## 2020-09-10 DIAGNOSIS — E1122 Type 2 diabetes mellitus with diabetic chronic kidney disease: Secondary | ICD-10-CM | POA: Diagnosis not present

## 2020-09-10 DIAGNOSIS — N182 Chronic kidney disease, stage 2 (mild): Secondary | ICD-10-CM | POA: Diagnosis not present

## 2020-09-14 ENCOUNTER — Other Ambulatory Visit: Payer: Self-pay

## 2020-09-14 DIAGNOSIS — I739 Peripheral vascular disease, unspecified: Secondary | ICD-10-CM

## 2020-09-14 NOTE — Progress Notes (Deleted)
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  Admitted 11/17 withNSTEMI (troponin peak of 4.10). Initial cath showed a widely patent LCx stent but there was evidence of focal thrombus at the distal LM and ostial LAD. She was continued on Aggrastat and a relook cath was performed on 11/17 which showed 40-50% distal LMCA haziness and 70-80% ostial LAD stenosis. HadCABG on 07/17/2016 with LIMA-LAD, SVG-OM, and SVG-dRCA). Also with h/o PAF.Carotid Dopplers January 2022 showed 1 to 39% bilateral stenosis.  Echocardiogram January 2022 showed normal LV function.  Nuclear study January 2022 showed probable infarct but no ischemia but was poor quality.  Since last seen   Current Outpatient Medications  Medication Sig Dispense Refill  . acetaminophen (TYLENOL) 500 MG tablet Take 500-1,000 mg by mouth every 8 (eight) hours as needed for mild pain or headache.    [name] ELIQUIS 5 MG TABS tablet TAKE 1 TABLET BY MOUTH TWICE DAILY (Patient taking differently: Take 5 mg by mouth 2 (two) times daily.) 180 tablet 1  . Ensure Max Protein (ENSURE MAX PROTEIN) LIQD Take 8 oz by mouth daily.    [name] ezetimibe (ZETIA) 10 MG tablet Take 1 tablet (10 mg total) by mouth daily. 90 tablet 3  . HYDROcodone-acetaminophen (NORCO/VICODIN) 5-325 MG tablet Take 1 tablet by mouth every 4 (four) hours as needed for moderate pain. 20 tablet 0  . metoprolol tartrate (LOPRESSOR) 25 MG tablet Take 0.5 tablets (12.5 mg total) by mouth 2 (two) times daily. 180 tablet 3  . nitroGLYCERIN (NITROSTAT) 0.4 MG SL tablet Place 0.4 mg under the tongue every 4 (four) hours as needed.    . rosuvastatin (CRESTOR) 40 MG tablet Take 40 mg by mouth at bedtime.     No current facility-administered medications for this visit.     Past Medical History:  Diagnosis Date  . Abnormal LFTs    a. in the past when on Lipitor  . Anemia    as a young woman   . Anxiety   . Arnold-Chiari malformation, type I (HCC)    MRI brain 02/2010  . CAD (coronary artery disease)    a. NSTEMI 07/2009: LHC - D2 40%, LAD irreg., EF 50% with apical AK (tako-tsubo CM);  b. Inf STEMI (04/2013): Tx Promus DES to CFX;  c. 08/2013 Lexi CL: No ischemia, dist ant/ap/inf-ap infarct, EF  d. 06/2016 NSTEMI with LM disease: s/p CABG on 07/17/2016 w/ LIMA-LAD, SVG-OM, and SVG-dRCA.  [name] DEPRESSION   . Dizziness    ? CVA 01/2010 - carotid dopplers with no ICA stenosis  . GERD (gastroesophageal reflux disease)   . Headache   . 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 HYPERLIPIDEMIA   . HYPERTENSION   . MI (myocardial infarction) (HCC)   . PAD (peripheral artery disease) (HCC)    s/p L fem pop 11/2013 in setting of 1st toe osteo/gangrene  . Paroxysmal atrial fibrillation (HCC)    a. anticoagulated with Eliquis 10/2014  . 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 RA (rheumatoid arthritis) (HCC)    prior tx by Dr. Dareen Piano  . Sjogren's syndrome (HCC) 06/02/13   pt denies this (12/01/13)  . SLE (systemic lupus erythematosus) (HCC) dx 05/2013   follows with rheum Dareen Piano)  . Tachy-brady syndrome (HCC)    a. s/p STJ Leadless pacemaker 11-04-2014 Dr Johney Frame  . 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 Wears dentures   . Wears glasses     Past Surgical History:  Procedure Laterality Date  . ABDOMINAL AORTAGRAM N/A 11/24/2013   Procedure: ABDOMINAL AORTAGRAM WITH BILATERAL RUNOFF WITH POSSIBLE INTERVENTION;  Surgeon: Chuck Hint, MD;  Location: [facility] CATH LAB;  Service: Cardiovascular;  Laterality: N/A;  . ABDOMINAL AORTOGRAM W/LOWER EXTREMITY N/A 04/25/2019   Procedure: ABDOMINAL AORTOGRAM W/LOWER EXTREMITY;  Surgeon: Sherren Kerns, MD;  [facility]: [facility] INVASIVE CV LAB;  Service: Cardiovascular;  Laterality: N/A;  . ABDOMINAL AORTOGRAM  W/LOWER EXTREMITY Bilateral 05/15/2019   Procedure: ABDOMINAL AORTOGRAM W/LOWER EXTREMITY;  Surgeon: Nada Libman, MD;  Location: [facility] INVASIVE CV LAB;  Service: Cardiovascular;  Laterality: Bilateral;  . ABDOMINAL AORTOGRAM W/LOWER EXTREMITY N/A 08/23/2020   Procedure: ABDOMINAL AORTOGRAM W/LOWER EXTREMITY;  Surgeon: Maeola Harman, MD;  Location: [facility] INVASIVE CV LAB;  Service: Cardiovascular;  Laterality: N/A;  . AMPUTATION Left 12/02/2013   Procedure: LEFT 1ST TOE AMPUTATION;  Surgeon: Sherren Kerns, MD;  Location: [facility] OR;  Service: Vascular;  Laterality: Left;  . AMPUTATION Left 07/16/2019   Procedure: LEFT TRANSMETATARSAL AMPUTATION;  Surgeon: Nadara Mustard, MD;  Location: [facility] OR;  Service: Orthopedics;  Laterality: Left;  . AMPUTATION Left 10/01/2019   Procedure: LEFT AMPUTATION ABOVE KNEE;  Surgeon: Sherren Kerns, MD;  Location: [facility] OR;  Service: Vascular;  Laterality: Left;  . AMPUTATION Right 08/31/2020   Procedure: Right first toe amputation;  Surgeon: Sherren Kerns, MD;  Location: [facility] OR;  Service: Vascular;  Laterality: Right;  . APPLICATION OF WOUND VAC Left 11/03/2019   Procedure: Application Of Wound Vac, left lower extremity;  Surgeon: Sherren Kerns, MD;  Location: [facility] OR;  Service: Vascular;  Laterality: Left;  . CARDIAC CATHETERIZATION N/A 07/12/2016   Procedure: Left Heart Cath and Coronary Angiography;  Surgeon: Lennette Bihari, MD;  Location: [facility] INVASIVE CV LAB;  Service: Cardiovascular;  Laterality: N/A;  . CARDIAC CATHETERIZATION N/A 07/14/2016   Procedure: Intravascular Ultrasound/IVUS;  Surgeon: Yvonne Kendall, MD;  Location: [facility] INVASIVE CV LAB;  Service: Cardiovascular;  Laterality: N/A;  . COLONOSCOPY    . CORONARY ANGIOPLASTY  04/2013  . CORONARY ARTERY BYPASS GRAFT N/A 07/17/2016   Procedure: CORONARY ARTERY BYPASS GRAFTING (CABG)x3 using right greater saphenous vein harvested endoscopiclly and left internal mammary artery;  Surgeon: Kerin Perna, MD;   Location: [facility] OR;  Service: Open Heart Surgery;  Laterality: N/A;  . ENDARTERECTOMY FEMORAL Left 05/12/2019   Procedure: LEFT Femoral Endarterectomy;  Surgeon: Sherren Kerns, MD;  Location: [facility] OR;  Service: Vascular;  Laterality: Left;  . FEMORAL-POPLITEAL BYPASS GRAFT Left 12/02/2013   Procedure:  LEFT FEMORAL-BELOW KNEE POPLITEAL ARTERY BYPASS GRAFT;  Surgeon: Sherren Kerns, MD;  Location: [facility] OR;  Service: Vascular;  Laterality: Left;  . FEMORAL-POPLITEAL BYPASS GRAFT Left 09/25/2019   Procedure: THROMBECTOMY and PROPATEN BYPASS  FEMORAL TO BELOW KNEE POPLITEAL ARTERY BYPASS GRAFT LEFT LEG;  Surgeon: Larina Earthly, MD;  [facility]: [facility] OR;  Service: Vascular;  Laterality: Left;  . FEMORAL-POPLITEAL BYPASS GRAFT Right 08/24/2020   Procedure: RIGHT FEMORAL-BELOW KNEE POPLITEAL ARTERY BYPASS USING GORE PROPATEN GRAFT;  Surgeon: Maeola Harman, MD;  Location: [facility] OR;  Service: Vascular;  Laterality: Right;  . LEFT HEART CATHETERIZATION WITH CORONARY ANGIOGRAM N/A 05/19/2013   Procedure: LEFT HEART CATHETERIZATION WITH CORONARY ANGIOGRAM;  Surgeon: Micheline Chapman, MD;  [facility]: [facility] CATH LAB;  Service: Cardiovascular;  Laterality: N/A;  . LEFT HEART CATHETERIZATION WITH CORONARY ANGIOGRAM N/A 10/28/2014   Procedure: LEFT HEART CATHETERIZATION WITH CORONARY ANGIOGRAM;  Surgeon: Iran Ouch, MD;  Location: [facility] CATH LAB;  Service: Cardiovascular;  Laterality: N/A;  . LOWER EXTREMITY ANGIOGRAM Left 05/12/2019   Procedure: Lower Extremity Angiogram;  Surgeon: Sherren Kerns, MD;  Location: [facility] OR;  Service: Vascular;  Laterality: Left;  . PATCH ANGIOPLASTY Left 05/12/2019   Procedure: Left Femoral Patch Angioplasty USING CADAVERIC SAPHENOUS VEIN;  Surgeon: Sherren Kerns, MD;  Location: [facility] OR;  Service: Vascular;  Laterality: Left;  . PERCUTANEOUS CORONARY STENT INTERVENTION (PCI-S)  05/19/2013   Procedure: PERCUTANEOUS CORONARY STENT INTERVENTION (PCI-S);  Surgeon: Micheline Chapman, MD;   Location: [facility] CATH LAB;  Service: Cardiovascular;;  . PERMANENT PACEMAKER INSERTION N/A 11/04/2014   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  . TEE WITHOUT CARDIOVERSION N/A 07/17/2016   Procedure: TRANSESOPHAGEAL ECHOCARDIOGRAM (TEE);  Surgeon: Kerin Perna, MD;  Location: [facility] OR;  Service: Open Heart Surgery;  Laterality: N/A;  . THROMBECTOMY FEMORAL ARTERY Left 05/12/2019   Procedure: Left Ilio-Femoral Artery and Femoral-popliteal Graft Thrombectomy;  Surgeon: Sherren Kerns, MD;  Location: [facility] OR;  Service: Vascular;  Laterality: Left;  . TUBAL LIGATION    . VISCERAL ANGIOGRAPHY N/A 04/25/2019   Procedure: MESENTRIC  ANGIOGRAPHY;  Surgeon: Sherren Kerns, MD;  Location: [facility] INVASIVE CV LAB;  Service: Cardiovascular;  Laterality: N/A;  . WOUND DEBRIDEMENT Left 11/03/2019   Procedure: Debridement Wound of left above the knee amputation;  Surgeon: Sherren Kerns, MD;  Location: [facility] OR;  Service: Vascular;  Laterality: Left;    Social History   Socioeconomic History  . Marital status: Single    Spouse name: Not on file  . Number of children: 3  . Years of education: Not on file  . Highest education level: Not on file  Occupational History  . Occupation: retired  Tobacco Use  . Smoking status: Former Smoker    Types: Cigarettes    Quit date: 2019    Years since quitting: 3.0  . Smokeless tobacco: Never Used  . Tobacco comment:    Vaping Use  . Vaping Use: Never used  Substance and Sexual Activity  . Alcohol use: No    Alcohol/week: 0.0 standard drinks  . Drug use: Not Currently    Types: Marijuana, Other-see comments    Comment:  and CBD oil  last use of both was 10/29/19  . Sexual activity: Not on file  Other Topics Concern  . Not on file  Social History Narrative   Lives alone.   Social Determinants of Health   Financial Resource Strain: Low Risk   . Difficulty of Paying Living Expenses: Not hard at all  Food Insecurity: No Food Insecurity  .  Worried About Programme researcher, broadcasting/film/video in the Last Year: Never true  . Ran Out of Food in the Last Year: Never true  Transportation Needs: No Transportation Needs  . Lack of Transportation (Medical): No  . Lack of Transportation (Non-Medical): No  Physical Activity: Inactive  . Days of Exercise per Week: 0 days  . Minutes of Exercise per Session: 0 min  Stress: No Stress Concern Present  . Feeling of Stress : Not at all  Social Connections: Unknown  . Frequency of Communication with Friends and Family: More than three times a week  . Frequency of Social Gatherings with  Friends and Family: Once a week  . Attends Religious Services: Patient refused  . Active Member of Clubs or Organizations: Patient refused  . Attends Banker Meetings: Patient refused  . Marital Status: Never married  Intimate Partner Violence: Not At Risk  . Fear of Current or Ex-Partner: No  . Emotionally Abused: No  . Physically Abused: No  . Sexually Abused: No    Family History  Problem Relation Age of Onset  . Arthritis Mother   . Emphysema Mother   . Arthritis Father   . COPD Father   . Heart disease Father   . Diabetes Paternal Aunt        paternal Aunts  . Heart disease Paternal Aunt   . Thyroid disease Neg Hx     ROS: no fevers or chills, productive cough, hemoptysis, dysphasia, odynophagia, melena, hematochezia, dysuria, hematuria, rash, seizure activity, orthopnea, PND, pedal edema, claudication. Remaining systems are negative.  Physical Exam: Well-developed well-nourished in no acute distress.  Skin is warm and dry.  HEENT is normal.  Neck is supple.  Chest is clear to auscultation with normal expansion.  Cardiovascular exam is regular rate and rhythm.  Abdominal exam nontender or distended. No masses palpated. Extremities show no edema. neuro grossly intact  ECG- personally reviewed  A/P  1 coronary artery disease status post coronary artery bypass graft-patient doing well  with no chest pain.  Most recent nuclear study showed no ischemia.  Continue statin.  She is not on aspirin given need for apixaban.  2 paroxysmal atrial fibrillation-she remains in sinus rhythm today.  Continue beta-blocker and apixaban at present dose.  3 hypertension-blood pressure controlled.  Continue present medications.  4 hyperlipidemia-continue Crestor 40 mg daily.  Check lipids and liver.  5 previous pacemaker-Per electrophysiology.  6 carotid artery disease-most recent Dopplers showed 1 to 39% stenosis.  7 peripheral vascular disease-followed by vascular surgery.  8 tobacco abuse-patient counseled on discontinuing.  Olga Millers, MD

## 2020-09-16 DIAGNOSIS — N182 Chronic kidney disease, stage 2 (mild): Secondary | ICD-10-CM | POA: Diagnosis not present

## 2020-09-16 DIAGNOSIS — I129 Hypertensive chronic kidney disease with stage 1 through stage 4 chronic kidney disease, or unspecified chronic kidney disease: Secondary | ICD-10-CM | POA: Diagnosis not present

## 2020-09-16 DIAGNOSIS — Z4781 Encounter for orthopedic aftercare following surgical amputation: Secondary | ICD-10-CM | POA: Diagnosis not present

## 2020-09-16 DIAGNOSIS — D631 Anemia in chronic kidney disease: Secondary | ICD-10-CM | POA: Diagnosis not present

## 2020-09-16 DIAGNOSIS — E1122 Type 2 diabetes mellitus with diabetic chronic kidney disease: Secondary | ICD-10-CM | POA: Diagnosis not present

## 2020-09-21 ENCOUNTER — Ambulatory Visit: Payer: Medicare Other | Admitting: Cardiology

## 2020-09-21 DIAGNOSIS — I129 Hypertensive chronic kidney disease with stage 1 through stage 4 chronic kidney disease, or unspecified chronic kidney disease: Secondary | ICD-10-CM | POA: Diagnosis not present

## 2020-09-21 DIAGNOSIS — E1122 Type 2 diabetes mellitus with diabetic chronic kidney disease: Secondary | ICD-10-CM | POA: Diagnosis not present

## 2020-09-21 DIAGNOSIS — D631 Anemia in chronic kidney disease: Secondary | ICD-10-CM | POA: Diagnosis not present

## 2020-09-21 DIAGNOSIS — N182 Chronic kidney disease, stage 2 (mild): Secondary | ICD-10-CM | POA: Diagnosis not present

## 2020-09-21 DIAGNOSIS — Z4781 Encounter for orthopedic aftercare following surgical amputation: Secondary | ICD-10-CM | POA: Diagnosis not present

## 2020-09-22 ENCOUNTER — Telehealth: Payer: Self-pay

## 2020-09-22 NOTE — Telephone Encounter (Signed)
Patient called to request refill of pain medicine. Site of toe amputation is painful. Denies swelling, redness, odor or drainage. Foot is not cold or discolored. Discussed with PA, patient may try elevation and tylenol - she will follow up with [facility] on 10/01/20. Patient verbalizes understanding.

## 2020-09-23 DIAGNOSIS — D631 Anemia in chronic kidney disease: Secondary | ICD-10-CM | POA: Diagnosis not present

## 2020-09-23 DIAGNOSIS — I129 Hypertensive chronic kidney disease with stage 1 through stage 4 chronic kidney disease, or unspecified chronic kidney disease: Secondary | ICD-10-CM | POA: Diagnosis not present

## 2020-09-23 DIAGNOSIS — Z4781 Encounter for orthopedic aftercare following surgical amputation: Secondary | ICD-10-CM | POA: Diagnosis not present

## 2020-09-23 DIAGNOSIS — N182 Chronic kidney disease, stage 2 (mild): Secondary | ICD-10-CM | POA: Diagnosis not present

## 2020-09-23 DIAGNOSIS — E1122 Type 2 diabetes mellitus with diabetic chronic kidney disease: Secondary | ICD-10-CM | POA: Diagnosis not present

## 2020-09-27 ENCOUNTER — Other Ambulatory Visit: Payer: Self-pay

## 2020-09-27 NOTE — Patient Instructions (Signed)
Goals Addressed              This Visit's Progress   .  Patient Stated (pt-stated)        Aging Gracefully: Patient states she would like to avoid falls in the next 90 days.   Interventions: reviewed fall precautions, reviewed importance of using assistive devices. Reviewed safety in bathroom where floor is wet.  Rowe Pavy, RN, BSN, CEN [facility] Community Care Coordinator [phone]   09/27/2020  Home visit completed. No recent falls. Currently in wheelchair or able to transfer to chair. Not currently using walker.   Interventions: Provided written amputee exercises. Reviewed knowledge of exercises with patient. Encouraged patient to do exercises daily.  Reviewed importance of safety in the home and avoid fall risk.    Rowe Pavy, RN, BSN, CEN [facility] [phone]

## 2020-09-27 NOTE — Patient Outreach (Signed)
Aging Gracefully Program  RN Visit  09/27/2020  Mercedes [name] October 05, 1955 [id number]  Visit:   RN home visit #2  Start Time:   1430 End Time:   1510 Total Minutes:   40  Readiness To Change Score:     Universal RN Interventions: Calendar Distribution: Yes Exercise Review: Yes Medications: Yes Medication Changes: Yes Mood: Yes Pain: Yes PCP Advocacy/Support: Yes Fall Prevention: Yes Incontinence: Yes Clinician View Of Client Situation: Negative Covid screening.   Arrived to home and entered through front door. Patient sitting in wheelchair and is able to propel herself independently.  Home neat and clean howevers smells of smoke.   Noted temporary ramp to rear of home.  Continues to have caregiver.  Recent hospital admission for right great toe amputation. Current wearing a post op shoe.  Awake and alert. Appears to be in good spirits. Client View Of His/Her Situation: Patient reports that she feels like she is doing well agter right grat toe amputation. reports active with Home health PT 2 times per week.   Denies any changes to medications. Mostly taking tylenol for pain. Reports sleeping and eating well. Denies any recent falls. Patient denies follow up with primary MD at this time. encouraged patient to make follow up appointment. Patient reports dressing changes to the right foot every other day. Reports she is able to do independently but caregiver also helps if needed.   Follow up planned with surgeon on 10/02/2020.  Healthcare Provider Communication:  Encouraged patient to make hospital follow up visit    Clinician View of Client Situation: Clinician View Of Client Situation: Negative Covid screening.   Arrived to home and entered through front door. Patient sitting in wheelchair and is able to propel herself independently.  Home neat and clean howevers smells of smoke.   Noted temporary ramp to rear of home.  Continues to have caregiver.  Recent hospital admission for right  great toe amputation. Current wearing a post op shoe.  Awake and alert. Appears to be in good spirits. Client's View of His/Her Situation: Client View Of His/Her Situation: Patient reports that she feels like she is doing well agter right grat toe amputation. reports active with Home health PT 2 times per week.   Denies any changes to medications. Mostly taking tylenol for pain. Reports sleeping and eating well. Denies any recent falls. Patient denies follow up with primary MD at this time. encouraged patient to make follow up appointment. Patient reports dressing changes to the right foot every other day. Reports she is able to do independently but caregiver also helps if needed.   Follow up planned with surgeon on 10/02/2020.  Medication Assessment: patient denies any changes in medications.    OT Update: pending contract signature. Reviewed with patient to expect call from [employer].   Session Summary: Home from hospital post right great toe amputation. Active with home health PT. Caregiver active 7 days per week.  Doing well, no falls. Pending MD follow up.  Goals Addressed              This Visit's Progress   .  Patient Stated (pt-stated)        Aging Gracefully: Patient states she would like to avoid falls in the next 90 days.   Interventions: reviewed fall precautions, reviewed importance of using assistive devices. Reviewed safety in bathroom where floor is wet.  Rowe Pavy, RN, BSN, CEN [facility] Community Care Coordinator [phone]   09/27/2020  Home visit  completed. No recent falls. Currently in wheelchair or able to transfer to chair. Not currently using walker.   Interventions: Provided written amputee exercises. Reviewed knowledge of exercises with patient. Encouraged patient to do exercises daily.  Reviewed importance of safety in the home and avoid fall risk.    Rowe Pavy, RN, BSN, CEN [facility] [phone]         Next  follow up planned for 10/25/2020 at 2pm.  Rowe Pavy, RN, BSN, CEN [facility] Coordinator [phone]

## 2020-09-28 DIAGNOSIS — N182 Chronic kidney disease, stage 2 (mild): Secondary | ICD-10-CM | POA: Diagnosis not present

## 2020-09-28 DIAGNOSIS — D631 Anemia in chronic kidney disease: Secondary | ICD-10-CM | POA: Diagnosis not present

## 2020-09-28 DIAGNOSIS — E1122 Type 2 diabetes mellitus with diabetic chronic kidney disease: Secondary | ICD-10-CM | POA: Diagnosis not present

## 2020-09-28 DIAGNOSIS — I129 Hypertensive chronic kidney disease with stage 1 through stage 4 chronic kidney disease, or unspecified chronic kidney disease: Secondary | ICD-10-CM | POA: Diagnosis not present

## 2020-09-28 DIAGNOSIS — Z4781 Encounter for orthopedic aftercare following surgical amputation: Secondary | ICD-10-CM | POA: Diagnosis not present

## 2020-09-30 DIAGNOSIS — Z4781 Encounter for orthopedic aftercare following surgical amputation: Secondary | ICD-10-CM | POA: Diagnosis not present

## 2020-09-30 DIAGNOSIS — E1122 Type 2 diabetes mellitus with diabetic chronic kidney disease: Secondary | ICD-10-CM | POA: Diagnosis not present

## 2020-09-30 DIAGNOSIS — D631 Anemia in chronic kidney disease: Secondary | ICD-10-CM | POA: Diagnosis not present

## 2020-09-30 DIAGNOSIS — I129 Hypertensive chronic kidney disease with stage 1 through stage 4 chronic kidney disease, or unspecified chronic kidney disease: Secondary | ICD-10-CM | POA: Diagnosis not present

## 2020-09-30 DIAGNOSIS — N182 Chronic kidney disease, stage 2 (mild): Secondary | ICD-10-CM | POA: Diagnosis not present

## 2020-10-01 ENCOUNTER — Other Ambulatory Visit: Payer: Self-pay

## 2020-10-01 ENCOUNTER — Ambulatory Visit (HOSPITAL_COMMUNITY)
Admission: RE | Admit: 2020-10-01 | Discharge: 2020-10-01 | Disposition: A | Discharge status: Home / Self Care | Payer: Medicare Other | Source: Ambulatory Visit | Attending: Vascular Surgery | Admitting: Vascular Surgery

## 2020-10-01 ENCOUNTER — Encounter: Payer: Self-pay | Admitting: Vascular Surgery

## 2020-10-01 ENCOUNTER — Ambulatory Visit (INDEPENDENT_AMBULATORY_CARE_PROVIDER_SITE_OTHER): Payer: Medicare Other | Admitting: Vascular Surgery

## 2020-10-01 ENCOUNTER — Ambulatory Visit (INDEPENDENT_AMBULATORY_CARE_PROVIDER_SITE_OTHER)
Admit: 2020-10-01 | Discharge: 2020-10-01 | Disposition: A | Discharge status: Home / Self Care | Payer: Medicare Other | Attending: Vascular Surgery | Admitting: Vascular Surgery

## 2020-10-01 VITALS — BP 154/83 | HR 70 | Temp 98.4°F | Resp 20 | Ht 65.0 in | Wt 90.0 lb

## 2020-10-01 DIAGNOSIS — I739 Peripheral vascular disease, unspecified: Secondary | ICD-10-CM | POA: Insufficient documentation

## 2020-10-01 NOTE — Progress Notes (Signed)
    Subjective:     Patient ID: Mercedes [name], female   DOB: [birth year], 65 y.o.   MRN: [id number]  HPI 65 year old female status post right common femoral endarterectomy and common femoral to below-knee popliteal artery bypass with PTFE.  He subsequently underwent right great toe amputation.  She follows up today with vascular studies.  She states that her foot is feeling well she is still getting physical therapy.  She is hoping to get back on her left lower extremity prosthesis soon   Review of Systems No complaints today    Objective:   Physical Exam Vitals:   10/01/20 1505  BP: (!) 154/83  Pulse: 70  Resp: 20  Temp: 98.4 F (36.9 C)  SpO2: 93%   Awake alert oriented Nonlabored respirations Right lower extremity incisions healing well Strong peroneal signal can be traced the anterior tibial distally on the foot all the skin of the foot looks healthy her toes do appear dusky but they have sluggish capillary refill Right great toe amputation site healing well sutures removed today     Right lower extremity arterial duplex with ABI 0.94.  Monophasic flow throughout lowest velocity is 50 cm/s in the proximal anastomosis with no downstream decrease velocity.   Assessment:     65 year old female status post right lower extremity bypass and great toe amputation.  Bypass patent today she does have monophasic flow but certainly this is an inflow issue reviewing her previous angiogram.  Sutures removed today    Plan:     Follow-up in 9 months with repeat right lower extremity duplex and ABIs.    Juneau Doughman C. Randie Heinz, MD [facility] Office: [phone] Pager: [phone]

## 2020-10-03 NOTE — Patient Instructions (Signed)
  Blood work was ordered.     Medications changes include :     Your prescription(s) have been submitted to your pharmacy. Please take as directed and contact our office if you believe you are having problem(s) with the medication(s).   A referral was ordered for        Someone from their office will call you to schedule an appointment.    Please followup in 6 months  

## 2020-10-03 NOTE — Progress Notes (Signed)
Subjective:    Patient ID: Mercedes [name], female    DOB: 13-Nov-1955, 65 y.o.   MRN: [id number]  HPI The patient is here for follow up from the hospital.   Admitted 12/27 - 1/12  She had a nail trimmed on her right foot and an infection that seemed to heal. She was having some intermittent pain in her right foot.  She had known right superficial femoral artery occlusion with one-vessel peroneal runoff.  The right foot was dusky.  Her ABI was 0.3.  She had an aortogram and RLE runoff.  Her femoral artery and SFA were occluded.  She went to OR 12/28 and had a right femoral to below knee popliteal artery bypass w/ PTFE graft.  Right common femoral, profundofemoral and SFA endarterectomy also performed.  She had a right toe amputation on 1/4.  She did have some chest pain and lightheadedness.  Cardiology was consulted.  Work up revealed no ischemia.       Medications and allergies reviewed with patient and updated if appropriate.  Patient Active Problem List   Diagnosis Date Noted  . Depression 12/15/2019  . Non-healing surgical wound 10/30/2019  . Stump pain (HCC) 10/21/2019  . S/P AKA (above knee amputation), left (HCC), 10/01/19 10/20/2019  . Femoral-popliteal bypass graft occlusion, left (HCC) 09/25/2019  . Peripheral arterial disease (HCC) 09/25/2019  . Protein-calorie malnutrition, severe 05/15/2019  . Critical lower limb ischemia (HCC) 05/09/2019  . Dyspnea 02/01/2019  . Weight loss, unintentional 02/01/2019  . Epigastric pain 02/01/2019  . 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 Acute renal failure superimposed on stage 2 chronic kidney disease (HCC) 10/20/2018  . Diabetes (HCC) 04/01/2017  . Hyperthyroidism 02/21/2017  . Low mean corpuscular volume (MCV) 09/30/2016  . Coronary artery disease involving native heart without angina pectoris   . Hx of CABG 07/17/2016  . Paroxysmal atrial fibrillation (HCC) 07/12/2016  . NSTEMI (non-ST elevated myocardial infarction) (HCC)  07/11/2016  . History of arterial bypass of lower extremity 12/30/2015  . Claudication of right lower extremity (HCC) 12/30/2015  . Phantom limb pain-Left great toe 04/29/2015  . Seizure (HCC)   . Atrial flutter with rapid ventricular response vs. SVT 10/27/2014  . Atherosclerosis of native artery of left lower extremity with rest pain (HCC) 12/18/2013  . PAD (peripheral artery disease) (HCC) 12/02/2013  . SLE (systemic lupus erythematosus) (HCC)   . CAD - S/P Takotsubo MI in 2000, CFX DES Sept 2014 05/25/2013  . Acute MI, inferior wall (HCC) 05/19/2013  . 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 Tachycardia-bradycardia (HCC) 03/09/2010  . PVD- s/p Lt [facility] April 2015 10/29/2009  . Dyslipidemia 08/26/2009  . Essential hypertension 08/26/2009  . CORONARY ATHEROSCLEROSIS NATIVE CORONARY ARTERY 08/26/2009  . 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 Rheumatoid arthritis (HCC) 08/26/2009    Current Outpatient Medications on File Prior to Visit  Medication Sig Dispense Refill  . acetaminophen (TYLENOL) 500 MG tablet Take 500-1,000 mg by mouth every 8 (eight) hours as needed for mild pain or headache.    [name] ELIQUIS 5 MG TABS tablet TAKE 1 TABLET BY MOUTH TWICE DAILY (Patient taking differently: Take 5 mg by mouth 2 (two) times daily.) 180 tablet 1  . Ensure Max Protein (ENSURE MAX PROTEIN) LIQD Take 8 oz by mouth daily.    [name] ezetimibe (ZETIA) 10 MG tablet Take 1 tablet (10 mg total) by mouth daily. 90 tablet 3  . HYDROcodone-acetaminophen (NORCO/VICODIN) 5-325 MG tablet Take 1 tablet by mouth every 4 (  four) hours as needed for moderate pain. 20 tablet 0  . metoprolol tartrate (LOPRESSOR) 25 MG tablet Take 0.5 tablets (12.5 mg total) by mouth 2 (two) times daily. 180 tablet 3  . nitroGLYCERIN (NITROSTAT) 0.4 MG SL tablet Place 0.4 mg under the tongue every 4 (four) hours as needed.    . rosuvastatin (CRESTOR) 40 MG tablet Take 40 mg by mouth at bedtime.     No current facility-administered  medications on file prior to visit.    Past Medical History:  Diagnosis Date  . Abnormal LFTs    a. in the past when on Lipitor  . Anemia    as a young woman  . Anxiety   . Arnold-Chiari malformation, type I (HCC)    MRI brain 02/2010  . CAD (coronary artery disease)    a. NSTEMI 07/2009: LHC - D2 40%, LAD irreg., EF 50% with apical AK (tako-tsubo CM);  b. Inf STEMI (04/2013): Tx Promus DES to CFX;  c. 08/2013 Lexi CL: No ischemia, dist ant/ap/inf-ap infarct, EF  d. 06/2016 NSTEMI with LM disease: s/p CABG on 07/17/2016 w/ LIMA-LAD, SVG-OM, and SVG-dRCA.  [name] DEPRESSION   . Dizziness    ? CVA 01/2010 - carotid dopplers with no ICA stenosis  . GERD (gastroesophageal reflux disease)   . Headache   . 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 HYPERLIPIDEMIA   . HYPERTENSION   . MI (myocardial infarction) (HCC)   . PAD (peripheral artery disease) (HCC)    s/p L fem pop 11/2013 in setting of 1st toe osteo/gangrene  . Paroxysmal atrial fibrillation (HCC)    a. anticoagulated with Eliquis 10/2014  . 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 RA (rheumatoid arthritis) (HCC)    prior tx by Dr. Dareen Piano  . Sjogren's syndrome (HCC) 06/02/13   pt denies this (12/01/13)  . SLE (systemic lupus erythematosus) (HCC) dx 05/2013   follows with rheum Dareen Piano)  . Tachy-brady syndrome (HCC)    a. s/p STJ Leadless pacemaker 11-04-2014 Dr Johney Frame  . 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 Wears dentures   . Wears glasses     Past Surgical History:  Procedure Laterality Date  . ABDOMINAL AORTAGRAM N/A 11/24/2013   Procedure: ABDOMINAL AORTAGRAM WITH BILATERAL RUNOFF WITH POSSIBLE INTERVENTION;  Surgeon: Chuck Hint, MD;  Location: [facility] CATH LAB;  Service: Cardiovascular;  Laterality: N/A;  . ABDOMINAL AORTOGRAM W/LOWER EXTREMITY N/A 04/25/2019   Procedure:  ABDOMINAL AORTOGRAM W/LOWER EXTREMITY;  Surgeon: Sherren Kerns, MD;  Location: [facility] INVASIVE CV LAB;  Service: Cardiovascular;  Laterality: N/A;  . ABDOMINAL AORTOGRAM W/LOWER EXTREMITY Bilateral 05/15/2019   Procedure: ABDOMINAL AORTOGRAM W/LOWER EXTREMITY;  Surgeon: Nada Libman, MD;  Location: [facility] INVASIVE CV LAB;  Service: Cardiovascular;  Laterality: Bilateral;  . ABDOMINAL AORTOGRAM W/LOWER EXTREMITY N/A 08/23/2020   Procedure: ABDOMINAL AORTOGRAM W/LOWER EXTREMITY;  Surgeon: Maeola Harman, MD;  Location: [facility] INVASIVE CV LAB;  Service: Cardiovascular;  Laterality: N/A;  . AMPUTATION Left 12/02/2013   Procedure: LEFT 1ST TOE AMPUTATION;  Surgeon: Sherren Kerns, MD;  Location: [facility] OR;  Service: Vascular;  Laterality: Left;  . AMPUTATION Left 07/16/2019   Procedure: LEFT TRANSMETATARSAL AMPUTATION;  Surgeon: Nadara Mustard, MD;  Location: [facility] OR;  Service: Orthopedics;  Laterality: Left;  . AMPUTATION Left 10/01/2019   Procedure: LEFT AMPUTATION ABOVE KNEE;  Surgeon:  Sherren Kerns, MD;  Location: [facility] OR;  Service: Vascular;  Laterality: Left;  . AMPUTATION Right 08/31/2020   Procedure: Right first toe amputation;  Surgeon: Sherren Kerns, MD;  Location: [facility] OR;  Service: Vascular;  Laterality: Right;  . APPLICATION OF WOUND VAC Left 11/03/2019   Procedure: Application Of Wound Vac, left lower extremity;  Surgeon: Sherren Kerns, MD;  Location: [facility] OR;  Service: Vascular;  Laterality: Left;  . CARDIAC CATHETERIZATION N/A 07/12/2016   Procedure: Left Heart Cath and Coronary Angiography;  Surgeon: Lennette Bihari, MD;  Location: [facility] INVASIVE CV LAB;  Service: Cardiovascular;  Laterality: N/A;  . CARDIAC CATHETERIZATION N/A 07/14/2016   Procedure: Intravascular Ultrasound/IVUS;  Surgeon: Yvonne Kendall, MD;  Location: [facility] INVASIVE CV LAB;  Service: Cardiovascular;  Laterality: N/A;  . COLONOSCOPY    . CORONARY ANGIOPLASTY  04/2013  . CORONARY ARTERY BYPASS GRAFT N/A 07/17/2016    Procedure: CORONARY ARTERY BYPASS GRAFTING (CABG)x3 using right greater saphenous vein harvested endoscopiclly and left internal mammary artery;  Surgeon: Kerin Perna, MD;  Location: [facility] OR;  Service: Open Heart Surgery;  Laterality: N/A;  . ENDARTERECTOMY FEMORAL Left 05/12/2019   Procedure: LEFT Femoral Endarterectomy;  Surgeon: Sherren Kerns, MD;  Location: [facility] OR;  Service: Vascular;  Laterality: Left;  . FEMORAL-POPLITEAL BYPASS GRAFT Left 12/02/2013   Procedure:  LEFT FEMORAL-BELOW KNEE POPLITEAL ARTERY BYPASS GRAFT;  Surgeon: Sherren Kerns, MD;  Location: [facility] OR;  Service: Vascular;  Laterality: Left;  . FEMORAL-POPLITEAL BYPASS GRAFT Left 09/25/2019   Procedure: THROMBECTOMY and PROPATEN BYPASS  FEMORAL TO BELOW KNEE POPLITEAL ARTERY BYPASS GRAFT LEFT LEG;  Surgeon: Larina Earthly, MD;  Location: [facility] OR;  Service: Vascular;  Laterality: Left;  . FEMORAL-POPLITEAL BYPASS GRAFT Right 08/24/2020   Procedure: RIGHT FEMORAL-BELOW KNEE POPLITEAL ARTERY BYPASS USING GORE PROPATEN GRAFT;  Surgeon: Maeola Harman, MD;  Location[facility] OR;  Service: Vascular;  Laterality: Right;  . LEFT HEART CATHETERIZATION WITH CORONARY ANGIOGRAM N/A 05/19/2013   Procedure: LEFT HEART CATHETERIZATION WITH CORONARY ANGIOGRAM;  Surgeon: Micheline Chapman, MD;  Location: [facility] CATH LAB;  Service: Cardiovascular;  Laterality: N/A;  . LEFT HEART CATHETERIZATION WITH CORONARY ANGIOGRAM N/A 10/28/2014   Procedure: LEFT HEART CATHETERIZATION WITH CORONARY ANGIOGRAM;  Surgeon: Iran Ouch, MD;  Location: [facility] CATH LAB;  Service: Cardiovascular;  Laterality: N/A;  . LOWER EXTREMITY ANGIOGRAM Left 05/12/2019   Procedure: Lower Extremity Angiogram;  Surgeon: Sherren Kerns, MD;  Location: [facility] OR;  Service: Vascular;  Laterality: Left;  . PATCH ANGIOPLASTY Left 05/12/2019   Procedure: Left Femoral Patch Angioplasty USING CADAVERIC SAPHENOUS VEIN;  Surgeon: Sherren Kerns, MD;  Location: [facility] OR;  Service: Vascular;   Laterality: Left;  . PERCUTANEOUS CORONARY STENT INTERVENTION (PCI-S)  05/19/2013   Procedure: PERCUTANEOUS CORONARY STENT INTERVENTION (PCI-S);  Surgeon: Micheline Chapman, MD;  Location: [facility] CATH LAB;  Service: Cardiovascular;;  . PERMANENT PACEMAKER INSERTION N/A 11/04/2014   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  . TEE WITHOUT CARDIOVERSION N/A 07/17/2016   Procedure: TRANSESOPHAGEAL ECHOCARDIOGRAM (TEE);  Surgeon: Kerin Perna, MD;  Location: [facility] OR;  Service: Open Heart Surgery;  Laterality: N/A;  . THROMBECTOMY FEMORAL ARTERY Left 05/12/2019   Procedure: Left Ilio-Femoral Artery and Femoral-popliteal Graft Thrombectomy;  Surgeon: Sherren Kerns, MD;  Location: [facility] OR;  Service: Vascular;  Laterality: Left;  . TUBAL LIGATION    . VISCERAL ANGIOGRAPHY N/A 04/25/2019   Procedure: MESENTRIC  ANGIOGRAPHY;  Surgeon: Sherren Kerns, MD;  Location: [facility] INVASIVE CV LAB;  Service: Cardiovascular;  Laterality: N/A;  . WOUND DEBRIDEMENT Left 11/03/2019   Procedure: Debridement Wound of left above the knee amputation;  Surgeon: Sherren Kerns, MD;  Location: [facility] OR;  Service: Vascular;  Laterality: Left;    Social History   Socioeconomic History  . Marital status: Single    Spouse name: Not on file  . Number of children: 3  . Years of education: Not on file  . Highest education level: Not on file  Occupational History  . Occupation: retired  Tobacco Use  . Smoking status: Former Smoker    Types: Cigarettes    Quit date: 2019    Years since quitting: 3.1  . Smokeless tobacco: Never Used  . Tobacco comment:    Vaping Use  . Vaping Use: Never used  Substance and Sexual Activity  . Alcohol use: No    Alcohol/week: 0.0 standard drinks  . Drug use: Not Currently    Types: Marijuana, Other-see comments    Comment:  and CBD oil  last use of both was 10/29/19  . Sexual activity: Not on file  Other Topics Concern  . Not on file  Social History Narrative   Lives  alone.   Social Determinants of Health   Financial Resource Strain: Low Risk   . Difficulty of Paying Living Expenses: Not hard at all  Food Insecurity: No Food Insecurity  . Worried About Programme researcher, broadcasting/film/video in the Last Year: Never true  . Ran Out of Food in the Last Year: Never true  Transportation Needs: No Transportation Needs  . Lack of Transportation (Medical): No  . Lack of Transportation (Non-Medical): No  Physical Activity: Inactive  . Days of Exercise per Week: 0 days  . Minutes of Exercise per Session: 0 min  Stress: No Stress Concern Present  . Feeling of Stress : Not at all  Social Connections: Unknown  . Frequency of Communication with Friends and Family: More than three times a week  . Frequency of Social Gatherings with Friends and Family: Once a week  . Attends Religious Services: Patient refused  . Active Member of Clubs or Organizations: Patient refused  . Attends Banker Meetings: Patient refused  . Marital Status: Never married    Family History  Problem Relation Age of Onset  . Arthritis Mother   . Emphysema Mother   . Arthritis Father   . COPD Father   . Heart disease Father   . Diabetes Paternal Aunt        paternal Aunts  . Heart disease Paternal Aunt   . Thyroid disease Neg Hx     Review of Systems     Objective:  There were no vitals filed for this visit. BP Readings from Last 3 Encounters:  10/01/20 (!) 154/83  09/08/20 116/75  05/31/20 (!) 150/80   Wt Readings from Last 3 Encounters:  10/01/20 90 lb (40.8 kg)  08/23/20 89 lb (40.4 kg)  05/31/20 98 lb 3.2 oz (44.5 kg)   There is no height or weight on file to calculate BMI.   Physical Exam    Constitutional: Appears well-developed and well-nourished. No distress.  HENT:  Head: Normocephalic and atraumatic.  Neck: Neck supple. No tracheal deviation present. No thyromegaly present.  No cervical lymphadenopathy Cardiovascular: Normal rate, regular rhythm and normal  heart sounds.   No murmur heard. No carotid bruit .  No edema Pulmonary/Chest: Effort  normal and breath sounds normal. No respiratory distress. No has no wheezes. No rales.  Skin: Skin is warm and dry. Not diaphoretic.  Psychiatric: Normal mood and affect. Behavior is normal.      Assessment & Plan:    See Problem List for Assessment and Plan of chronic medical problems.    This visit occurred during the SARS-CoV-2 public health emergency.  Safety protocols were in place, including screening questions prior to the visit, additional usage of staff PPE, and extensive cleaning of exam room while observing appropriate contact time as indicated for disinfecting solutions.    This encounter was created in error - please disregard.

## 2020-10-04 ENCOUNTER — Encounter: Payer: Medicare Other | Admitting: Internal Medicine

## 2020-10-04 DIAGNOSIS — E1159 Type 2 diabetes mellitus with other circulatory complications: Secondary | ICD-10-CM

## 2020-10-05 ENCOUNTER — Other Ambulatory Visit: Payer: Self-pay

## 2020-10-05 DIAGNOSIS — N182 Chronic kidney disease, stage 2 (mild): Secondary | ICD-10-CM | POA: Diagnosis not present

## 2020-10-05 DIAGNOSIS — Z4781 Encounter for orthopedic aftercare following surgical amputation: Secondary | ICD-10-CM | POA: Diagnosis not present

## 2020-10-05 DIAGNOSIS — E1122 Type 2 diabetes mellitus with diabetic chronic kidney disease: Secondary | ICD-10-CM | POA: Diagnosis not present

## 2020-10-05 DIAGNOSIS — I739 Peripheral vascular disease, unspecified: Secondary | ICD-10-CM

## 2020-10-05 DIAGNOSIS — I129 Hypertensive chronic kidney disease with stage 1 through stage 4 chronic kidney disease, or unspecified chronic kidney disease: Secondary | ICD-10-CM | POA: Diagnosis not present

## 2020-10-05 DIAGNOSIS — D631 Anemia in chronic kidney disease: Secondary | ICD-10-CM | POA: Diagnosis not present

## 2020-10-07 ENCOUNTER — Encounter: Payer: Medicare Other | Admitting: Internal Medicine

## 2020-10-07 ENCOUNTER — Telehealth: Payer: Self-pay

## 2020-10-07 DIAGNOSIS — N182 Chronic kidney disease, stage 2 (mild): Secondary | ICD-10-CM | POA: Diagnosis not present

## 2020-10-07 DIAGNOSIS — Z4781 Encounter for orthopedic aftercare following surgical amputation: Secondary | ICD-10-CM | POA: Diagnosis not present

## 2020-10-07 DIAGNOSIS — D631 Anemia in chronic kidney disease: Secondary | ICD-10-CM | POA: Diagnosis not present

## 2020-10-07 DIAGNOSIS — I129 Hypertensive chronic kidney disease with stage 1 through stage 4 chronic kidney disease, or unspecified chronic kidney disease: Secondary | ICD-10-CM | POA: Diagnosis not present

## 2020-10-07 DIAGNOSIS — E1122 Type 2 diabetes mellitus with diabetic chronic kidney disease: Secondary | ICD-10-CM | POA: Diagnosis not present

## 2020-10-07 NOTE — Telephone Encounter (Signed)
Gave verbal orders to PT for extension of prosthetic leg training 2x a week for 1 week and 1x a week for 3 weeks.

## 2020-10-10 ENCOUNTER — Other Ambulatory Visit: Payer: Self-pay | Admitting: Cardiology

## 2020-10-10 DIAGNOSIS — I10 Essential (primary) hypertension: Secondary | ICD-10-CM

## 2020-10-12 DIAGNOSIS — Z4781 Encounter for orthopedic aftercare following surgical amputation: Secondary | ICD-10-CM | POA: Diagnosis not present

## 2020-10-12 DIAGNOSIS — E1122 Type 2 diabetes mellitus with diabetic chronic kidney disease: Secondary | ICD-10-CM | POA: Diagnosis not present

## 2020-10-12 DIAGNOSIS — N182 Chronic kidney disease, stage 2 (mild): Secondary | ICD-10-CM | POA: Diagnosis not present

## 2020-10-12 DIAGNOSIS — I129 Hypertensive chronic kidney disease with stage 1 through stage 4 chronic kidney disease, or unspecified chronic kidney disease: Secondary | ICD-10-CM | POA: Diagnosis not present

## 2020-10-12 DIAGNOSIS — D631 Anemia in chronic kidney disease: Secondary | ICD-10-CM | POA: Diagnosis not present

## 2020-10-14 DIAGNOSIS — Z4781 Encounter for orthopedic aftercare following surgical amputation: Secondary | ICD-10-CM | POA: Diagnosis not present

## 2020-10-14 DIAGNOSIS — D631 Anemia in chronic kidney disease: Secondary | ICD-10-CM | POA: Diagnosis not present

## 2020-10-14 DIAGNOSIS — I129 Hypertensive chronic kidney disease with stage 1 through stage 4 chronic kidney disease, or unspecified chronic kidney disease: Secondary | ICD-10-CM | POA: Diagnosis not present

## 2020-10-14 DIAGNOSIS — E1122 Type 2 diabetes mellitus with diabetic chronic kidney disease: Secondary | ICD-10-CM | POA: Diagnosis not present

## 2020-10-14 DIAGNOSIS — N182 Chronic kidney disease, stage 2 (mild): Secondary | ICD-10-CM | POA: Diagnosis not present

## 2020-10-21 ENCOUNTER — Other Ambulatory Visit: Payer: Self-pay

## 2020-10-21 NOTE — Patient Instructions (Addendum)
  Your a1c (sugar) was checked today.     Medications changes include :   none     Please followup in 1 year

## 2020-10-21 NOTE — Progress Notes (Signed)
Subjective:    Patient ID: Mercedes [name], female    DOB: August 18, 1956, 65 y.o.   MRN: [id number]  HPI The patient is here for follow up from the hospital. She is also here for follow up of DM, hyperlipidemia, htn   Admitted 08/23/20 - 09/08/20 for PAD  She was admitted for ischemic limb.  She had a right femoral below knee popliteal artery bypass with graft.  She had a right first toe amputation.   She is at home alone and is having difficulty caring for herself.  She does not have any family in the area.  She has liberty - has aid 2 hours a day daily through [place] that helps with laundry, bathing.    Needs CAPS from medicaid.  She will give [place] paperwork to fill out to get a home health aid to help getting her to the grocery store, doctor's appt, etc.  The liberty aid can not take her places.    She is depressed because of what she can not do, but she knows that will improve.    She is eating well.  She has no other concerns.  She is anxious to get back to using her prosthesis so that she can be a little bit more active.  Medications and allergies reviewed with patient and updated if appropriate.  Patient Active Problem List   Diagnosis Date Noted  . Depression 12/15/2019  . Non-healing surgical wound 10/30/2019  . Stump pain (HCC) 10/21/2019  . S/P AKA (above knee amputation), left (HCC), 10/01/19 10/20/2019  . Femoral-popliteal bypass graft occlusion, left (HCC) 09/25/2019  . Peripheral arterial disease (HCC) 09/25/2019  . Protein-calorie malnutrition, severe 05/15/2019  . Critical lower limb ischemia (HCC) 05/09/2019  . Dyspnea 02/01/2019  . Weight loss, unintentional 02/01/2019  . Epigastric pain 02/01/2019  . 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 Acute renal failure superimposed on stage 2 chronic kidney disease (HCC) 10/20/2018  . Diabetes (HCC) 04/01/2017  . Hyperthyroidism 02/21/2017  . Low mean corpuscular volume (MCV) 09/30/2016  . Coronary artery disease  involving native heart without angina pectoris   . Hx of CABG 07/17/2016  . Paroxysmal atrial fibrillation (HCC) 07/12/2016  . NSTEMI (non-ST elevated myocardial infarction) (HCC) 07/11/2016  . History of arterial bypass of lower extremity 12/30/2015  . Claudication of right lower extremity (HCC) 12/30/2015  . Phantom limb pain-Left great toe 04/29/2015  . Seizure (HCC)   . Atrial flutter with rapid ventricular response vs. SVT 10/27/2014  . Atherosclerosis of native artery of left lower extremity with rest pain (HCC) 12/18/2013  . PAD (peripheral artery disease) (HCC) 12/02/2013  . SLE (systemic lupus erythematosus) (HCC)   . CAD - S/P Takotsubo MI in 2000, CFX DES Sept 2014 05/25/2013  . Acute MI, inferior wall (HCC) 05/19/2013  . 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 Tachycardia-bradycardia (HCC) 03/09/2010  . PVD- s/p Lt [facility] April 2015 10/29/2009  . Dyslipidemia 08/26/2009  . Essential hypertension 08/26/2009  . CORONARY ATHEROSCLEROSIS NATIVE CORONARY ARTERY 08/26/2009  . 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 Rheumatoid arthritis (HCC) 08/26/2009    Current Outpatient Medications on File Prior to Visit  Medication Sig Dispense Refill  . acetaminophen (TYLENOL) 500 MG tablet Take 500-1,000 mg by mouth every 8 (eight) hours as needed for mild pain or headache.    [name] ELIQUIS 5 MG TABS tablet TAKE 1 TABLET BY MOUTH TWICE DAILY (Patient taking differently: Take 5 mg by mouth 2 (two) times daily.) 180 tablet 1  .  Ensure Max Protein (ENSURE MAX PROTEIN) LIQD Take 8 oz by mouth daily.    [name] ezetimibe (ZETIA) 10 MG tablet Take 1 tablet (10 mg total) by mouth daily. 90 tablet 3  . metoprolol tartrate (LOPRESSOR) 25 MG tablet Take 0.5 tablets (12.5 mg total) by mouth 2 (two) times daily. 180 tablet 3  . nitroGLYCERIN (NITROSTAT) 0.4 MG SL tablet Place 0.4 mg under the tongue every 4 (four) hours as needed.    . rosuvastatin (CRESTOR) 40 MG tablet Take 40 mg by mouth at bedtime.      No current facility-administered medications on file prior to visit.    Past Medical History:  Diagnosis Date  . Abnormal LFTs    a. in the past when on Lipitor  . Anemia    as a young woman  . Anxiety   . Arnold-Chiari malformation, type I (HCC)    MRI brain 02/2010  . CAD (coronary artery disease)    a. NSTEMI 07/2009: LHC - D2 40%, LAD irreg., EF 50% with apical AK (tako-tsubo CM);  b. Inf STEMI (04/2013): Tx Promus DES to CFX;  c. 08/2013 Lexi CL: No ischemia, dist ant/ap/inf-ap infarct, EF  d. 06/2016 NSTEMI with LM disease: s/p CABG on 07/17/2016 w/ LIMA-LAD, SVG-OM, and SVG-dRCA.  [name] DEPRESSION   . Dizziness    ? CVA 01/2010 - carotid dopplers with no ICA stenosis  . GERD (gastroesophageal reflux disease)   . Headache   . 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 HYPERLIPIDEMIA   . HYPERTENSION   . MI (myocardial infarction) (HCC)   . PAD (peripheral artery disease) (HCC)    s/p L fem pop 11/2013 in setting of 1st toe osteo/gangrene  . Paroxysmal atrial fibrillation (HCC)    a. anticoagulated with Eliquis 10/2014  . 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 RA (rheumatoid arthritis) (HCC)    prior tx by Dr. Dareen Piano  . Sjogren's syndrome (HCC) 06/02/13   pt denies this (12/01/13)  . SLE (systemic lupus erythematosus) (HCC) dx 05/2013   follows with rheum Dareen Piano)  . Tachy-brady syndrome (HCC)    a. s/p STJ Leadless pacemaker 11-04-2014 Dr Johney Frame  . 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 Wears dentures   . Wears glasses     Past Surgical History:  Procedure Laterality Date  . ABDOMINAL AORTAGRAM N/A 11/24/2013   Procedure: ABDOMINAL AORTAGRAM WITH BILATERAL RUNOFF WITH POSSIBLE INTERVENTION;  Surgeon: Chuck Hint, MD;  Location: [facility] CATH LAB;  Service: Cardiovascular;  Laterality: N/A;  . ABDOMINAL AORTOGRAM W/LOWER EXTREMITY  N/A 04/25/2019   Procedure: ABDOMINAL AORTOGRAM W/LOWER EXTREMITY;  Surgeon: Sherren Kerns, MD;  Location: [facility] INVASIVE CV LAB;  Service: Cardiovascular;  Laterality: N/A;  . ABDOMINAL AORTOGRAM W/LOWER EXTREMITY Bilateral 05/15/2019   Procedure: ABDOMINAL AORTOGRAM W/LOWER EXTREMITY;  Surgeon: Nada Libman, MD;  Location: [facility] INVASIVE CV LAB;  Service: Cardiovascular;  Laterality: Bilateral;  . ABDOMINAL AORTOGRAM W/LOWER EXTREMITY N/A 08/23/2020   Procedure: ABDOMINAL AORTOGRAM W/LOWER EXTREMITY;  Surgeon: Maeola Harman, MD;  Location: [facility] INVASIVE CV LAB;  Service: Cardiovascular;  Laterality: N/A;  . AMPUTATION Left 12/02/2013   Procedure: LEFT 1ST TOE AMPUTATION;  Surgeon: Sherren Kerns, MD;  Location: [facility] OR;  Service: Vascular;  Laterality: Left;  . AMPUTATION Left 07/16/2019   Procedure: LEFT TRANSMETATARSAL AMPUTATION;  Surgeon: Nadara Mustard, MD;  Location: [facility] OR;  Service: Orthopedics;  Laterality: Left;  . AMPUTATION Left 10/01/2019   Procedure: LEFT AMPUTATION ABOVE KNEE;  Surgeon: Sherren Kerns, MD;  Location: [facility] OR;  Service: Vascular;  Laterality: Left;  . AMPUTATION Right 08/31/2020   Procedure: Right first toe amputation;  Surgeon: Sherren Kerns, MD;  Location: [facility] OR;  Service: Vascular;  Laterality: Right;  . APPLICATION OF WOUND VAC Left 11/03/2019   Procedure: Application Of Wound Vac, left lower extremity;  Surgeon: Sherren Kerns, MD;  Location: [facility] OR;  Service: Vascular;  Laterality: Left;  . CARDIAC CATHETERIZATION N/A 07/12/2016   Procedure: Left Heart Cath and Coronary Angiography;  Surgeon: Lennette Bihari, MD;  Location: [facility] INVASIVE CV LAB;  Service: Cardiovascular;  Laterality: N/A;  . CARDIAC CATHETERIZATION N/A 07/14/2016   Procedure: Intravascular Ultrasound/IVUS;  Surgeon: Yvonne Kendall, MD;  Location: [facility] INVASIVE CV LAB;  Service: Cardiovascular;  Laterality: N/A;  . COLONOSCOPY    . CORONARY ANGIOPLASTY  04/2013  . CORONARY ARTERY BYPASS  GRAFT N/A 07/17/2016   Procedure: CORONARY ARTERY BYPASS GRAFTING (CABG)x3 using right greater saphenous vein harvested endoscopiclly and left internal mammary artery;  Surgeon: Kerin Perna, MD;  Location: [facility] OR;  Service: Open Heart Surgery;  Laterality: N/A;  . ENDARTERECTOMY FEMORAL Left 05/12/2019   Procedure: LEFT Femoral Endarterectomy;  Surgeon: Sherren Kerns, MD;  Location: [facility] OR;  Service: Vascular;  Laterality: Left;  . FEMORAL-POPLITEAL BYPASS GRAFT Left 12/02/2013   Procedure:  LEFT FEMORAL-BELOW KNEE POPLITEAL ARTERY BYPASS GRAFT;  Surgeon: Sherren Kerns, MD;  Location: [facility] OR;  Service: Vascular;  Laterality: Left;  . FEMORAL-POPLITEAL BYPASS GRAFT Left 09/25/2019   Procedure: THROMBECTOMY and PROPATEN BYPASS  FEMORAL TO BELOW KNEE POPLITEAL ARTERY BYPASS GRAFT LEFT LEG;  Surgeon: Larina Earthly, MD;  Location: [facility] OR;  Service: Vascular;  Laterality: Left;  . FEMORAL-POPLITEAL BYPASS GRAFT Right 08/24/2020   Procedure: RIGHT FEMORAL-BELOW KNEE POPLITEAL ARTERY BYPASS USING GORE PROPATEN GRAFT;  Surgeon: Maeola Harman, MD;  Location: [facility] OR;  Service: Vascular;  Laterality: Right;  . LEFT HEART CATHETERIZATION WITH CORONARY ANGIOGRAM N/A 05/19/2013   Procedure: LEFT HEART CATHETERIZATION WITH CORONARY ANGIOGRAM;  Surgeon: Micheline Chapman, MD;  Location: [facility] CATH LAB;  Service: Cardiovascular;  Laterality: N/A;  . LEFT HEART CATHETERIZATION WITH CORONARY ANGIOGRAM N/A 10/28/2014   Procedure: LEFT HEART CATHETERIZATION WITH CORONARY ANGIOGRAM;  Surgeon: Iran Ouch, MD;  Location: [facility] CATH LAB;  Service: Cardiovascular;  Laterality: N/A;  . LOWER EXTREMITY ANGIOGRAM Left 05/12/2019   Procedure: Lower Extremity Angiogram;  Surgeon: Sherren Kerns, MD;  Location: [facility] OR;  Service: Vascular;  Laterality: Left;  . PATCH ANGIOPLASTY Left 05/12/2019   Procedure: Left Femoral Patch Angioplasty USING CADAVERIC SAPHENOUS VEIN;  Surgeon: Sherren Kerns, MD;  Location: [facility] OR;   Service: Vascular;  Laterality: Left;  . PERCUTANEOUS CORONARY STENT INTERVENTION (PCI-S)  05/19/2013   Procedure: PERCUTANEOUS CORONARY STENT INTERVENTION (PCI-S);  Surgeon: Micheline Chapman, MD;  Location: [facility] CATH LAB;  Service: Cardiovascular;;  . PERMANENT PACEMAKER INSERTION N/A 11/04/2014   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  . TEE WITHOUT CARDIOVERSION N/A 07/17/2016   Procedure: TRANSESOPHAGEAL ECHOCARDIOGRAM (TEE);  Surgeon: Kerin Perna, MD;  Location[facility] OR;  Service: Open Heart Surgery;  Laterality: N/A;  . THROMBECTOMY FEMORAL ARTERY Left 05/12/2019   Procedure: Left Ilio-Femoral Artery and Femoral-popliteal Graft Thrombectomy;  Surgeon: Sherren Kerns, MD;  Location: [facility] OR;  Service: Vascular;  Laterality: Left;  . TUBAL LIGATION    . VISCERAL ANGIOGRAPHY N/A 04/25/2019   Procedure: MESENTRIC  ANGIOGRAPHY;  Surgeon: Sherren Kerns, MD;  Location: [facility] INVASIVE CV LAB;  Service: Cardiovascular;  Laterality: N/A;  . WOUND DEBRIDEMENT Left 11/03/2019   Procedure: Debridement Wound of left above the knee amputation;  Surgeon: Sherren Kerns, MD;  Location: [facility] OR;  Service: Vascular;  Laterality: Left;    Social History   Socioeconomic History  . Marital status: Single    Spouse name: Not on file  . Number of children: 3  . Years of education: Not on file  . Highest education level: Not on file  Occupational History  . Occupation: retired  Tobacco Use  . Smoking status: Former Smoker    Types: Cigarettes    Quit date: 2019    Years since quitting: 3.1  . Smokeless tobacco: Never Used  . Tobacco comment:    Vaping Use  . Vaping Use: Never used  Substance and Sexual Activity  . Alcohol use: No    Alcohol/week: 0.0 standard drinks  . Drug use: Not Currently    Types: Marijuana, Other-see comments    Comment:  and CBD oil  last use of both was 10/29/19  . Sexual activity: Not on file  Other Topics Concern  . Not on file  Social History  Narrative   Lives alone.   Social Determinants of Health   Financial Resource Strain: Low Risk   . Difficulty of Paying Living Expenses: Not hard at all  Food Insecurity: No Food Insecurity  . Worried About Programme researcher, broadcasting/film/video in the Last Year: Never true  . Ran Out of Food in the Last Year: Never true  Transportation Needs: No Transportation Needs  . Lack of Transportation (Medical): No  . Lack of Transportation (Non-Medical): No  Physical Activity: Inactive  . Days of Exercise per Week: 0 days  . Minutes of Exercise per Session: 0 min  Stress: No Stress Concern Present  . Feeling of Stress : Not at all  Social Connections: Unknown  . Frequency of Communication with Friends and Family: More than three times a week  . Frequency of Social Gatherings with Friends and Family: Once a week  . Attends Religious Services: Patient refused  . Active Member of Clubs or Organizations: Patient refused  . Attends Banker Meetings: Patient refused  . Marital Status: Never married    Family History  Problem Relation Age of Onset  . Arthritis Mother   . Emphysema Mother   . Arthritis Father   . COPD Father   . Heart disease Father   . Diabetes Paternal Aunt        paternal Aunts  . Heart disease Paternal Aunt   . Thyroid disease Neg Hx     Review of Systems  Constitutional: Negative for fever.  Respiratory: Negative for cough, shortness of breath and wheezing.   Cardiovascular: Positive for palpitations and leg swelling (right foot). Negative for chest pain.  Neurological: Negative for light-headedness and headaches.       Objective:   Vitals:   10/22/20 1115  BP: (!) 144/82  Pulse: 81  Temp: 98.9 F (37.2 C)  SpO2: 98%   BP Readings from Last 3 Encounters:  10/22/20 (!) 144/82  10/01/20 (!) 154/83  09/08/20 116/75   Wt Readings from Last 3 Encounters:  10/01/20 90 lb (40.8 kg)  08/23/20 89 lb (40.4 kg)  05/31/20  98 lb 3.2 oz (44.5 kg)   Body mass  index is 14.98 kg/m.   Physical Exam    Constitutional: Appears well-developed and well-nourished. No distress.  Sitting in wheelchair.  Surgical boot on right foot.  Status post left AKA HENT:  Head: Normocephalic and atraumatic.  Neck: Neck supple. No tracheal deviation present. No thyromegaly present.  No cervical lymphadenopathy Cardiovascular: Normal rate, regular rhythm and normal heart sounds.   No murmur heard. No carotid bruit .  No edema Pulmonary/Chest: Effort normal and breath sounds normal. No respiratory distress. No has no wheezes. No rales. Musculoskeletal: Status post left AKA.  Full range of motion of bilateral upper extremities with normal strength.  Deferred exam of right lower extremity given recent surgery-monitor bivascular Skin: Skin is warm and dry. Not diaphoretic.  Psychiatric: Normal mood and affect. Behavior is normal.      Assessment & Plan:    I spent 20 minutes dedicated to the care of this patient on the date of this encounter including review of recent labs, imaging and procedures, speciality notes, obtaining history, communicating with the patient, ordering medications, tests, and documenting clinical information in the EHR     See Problem List for Assessment and Plan of chronic medical problems.    This visit occurred during the SARS-CoV-2 public health emergency.  Safety protocols were in place, including screening questions prior to the visit, additional usage of staff PPE, and extensive cleaning of exam room while observing appropriate contact time as indicated for disinfecting solutions.

## 2020-10-22 ENCOUNTER — Encounter: Payer: Self-pay | Admitting: Internal Medicine

## 2020-10-22 ENCOUNTER — Ambulatory Visit (INDEPENDENT_AMBULATORY_CARE_PROVIDER_SITE_OTHER): Payer: Medicare Other | Admitting: Internal Medicine

## 2020-10-22 VITALS — BP 144/82 | HR 81 | Temp 98.9°F | Ht 65.0 in

## 2020-10-22 DIAGNOSIS — E1159 Type 2 diabetes mellitus with other circulatory complications: Secondary | ICD-10-CM | POA: Diagnosis not present

## 2020-10-22 DIAGNOSIS — Z89612 Acquired absence of left leg above knee: Secondary | ICD-10-CM | POA: Diagnosis not present

## 2020-10-22 DIAGNOSIS — E785 Hyperlipidemia, unspecified: Secondary | ICD-10-CM | POA: Diagnosis not present

## 2020-10-22 LAB — POCT GLYCOSYLATED HEMOGLOBIN (HGB A1C)
HbA1c POC (<> result, manual entry): 5.7 % (ref 4.0–5.6)
HbA1c, POC (controlled diabetic range): 5.7 % (ref 0.0–7.0)
HbA1c, POC (prediabetic range): 5.7 % (ref 5.7–6.4)
Hemoglobin A1C: 5.7 % — AB (ref 4.0–5.6)

## 2020-10-22 NOTE — Assessment & Plan Note (Signed)
Chronic Sugars were elevated for a year in the diabetic range, but since then have been well controlled in the prediabetic range Diet controlled She will continue diabetic diet She will become more active once her right leg heals and she is able to get back to wearing her prosthesis A1c today in prediabetic range and well controlled We will monitor every 6 months

## 2020-10-22 NOTE — Assessment & Plan Note (Signed)
Chronic Not currently able to use her prosthesis because she is still healing from her right leg surgery Wheelchair-bound at this time

## 2020-10-22 NOTE — Assessment & Plan Note (Signed)
Chronic Continue rosuvastatin 40 mg daily-management per cardiology

## 2020-10-25 ENCOUNTER — Other Ambulatory Visit: Payer: Self-pay

## 2020-10-25 NOTE — Patient Outreach (Signed)
Aging Gracefully Program  RN Visit  10/25/2020  Mercedes [name] 10/30/1955 [id number]  Visit:    RN home visit 3.  Start Time:   1400 End Time:   1445 Total Minutes:   45  Readiness To Change Score:     Universal RN Interventions: Calendar Distribution: Yes Exercise Review: Yes Medications: Yes Medication Changes: Yes Mood: Yes Pain: Yes PCP Advocacy/Support: Yes Fall Prevention: Yes Incontinence: Yes Clinician View Of Client Situation: Covid Screening negative prior to home visit. Patient sitting in recliner. Caregiver present. Home neat and clean. Client View Of His/Her Situation: Patient states that she saw her PCP last week and her surgeon the week before. Reports she is going to [employer] to see about her prostetic leg this week.  Reports she is only taking tylenol for pain.  Reports she continues to do her home exercises, Reports using fall precautions.  Reports no medications changes. States she is in the process of  faxing her paper work for CAPS program and to get mailbox  moved to the house vs road. Continues to smoke 1 pack every 3 weeks. Caregiver with patient M-F 1-3pm.  Reports taking showers independently.  Denies any problems with skin breakdown.  Healthcare Provider Communication:none    Clinician View of Client Situation: Diplomatic Services operational officer Of Client Situation: Covid Screening negative prior to home visit. Patient sitting in recliner. Caregiver present. Home neat and clean. Client's View of His/Her Situation: Client View Of His/Her Situation: Patient states that she saw her PCP last week and her surgeon the week before. Reports she is going to [employer] to see about her prostetic leg this week.  Reports she is only taking tylenol for pain.  Reports she continues to do her home exercises, Reports using fall precautions.  Reports no medications changes. States she is in the process of  faxing her paper work for CAPS program and to get mailbox  moved to the house vs road.  Continues to smoke 1 pack every 3 weeks. Caregiver with patient M-F 1-3pm.  Reports taking showers independently.  Denies any problems with skin breakdown.  Medication Assessment:denies any changes in medications    OT Update: Reviewed with the patient the plan for renovations to be completed in the next 2 weeks and then OT will come back and work with patient.  Session Summary: Patient awake and alert and doing very well. Reviewed importance of weekly skin inspection due to spending so much time in chair.  Reviewed fall precautions. Encouraged patient to continue home exercise plan.   Goals Addressed              This Visit's Progress   .  Patient Stated (pt-stated)        Aging Gracefully: Patient states she would like to avoid falls in the next 90 days.   Interventions: reviewed fall precautions, reviewed importance of using assistive devices. Reviewed safety in bathroom where floor is wet.  Rowe Pavy, RN, BSN, CEN [facility] Care Coordinator [phone]   09/27/2020  Home visit completed. No recent falls. Currently in wheelchair or able to transfer to chair. Not currently using walker.   Interventions: Provided written amputee exercises. Reviewed knowledge of exercises with patient. Encouraged patient to do exercises daily.  Reviewed importance of safety in the home and avoid fall risk.    Rowe Pavy, RN, BSN, CEN [facility] Care Coordinator [phone]    10/25/2020    Assessment:  Patient denies any falls. Patient reports she is being cautious.  Review importance of continuing to do home exercises.  Interventions:  Reviewed fall precautions. Encouraged patient to continue to do home exercises. Rowe Pavy, RN, BSN, CEN [facility] [phone]      Next home visit planned for 11/22/2020 Encouraged patient to call me sooner if needed.  Rowe Pavy, RN, BSN, CEN [facility] [phone]

## 2020-10-27 ENCOUNTER — Telehealth: Payer: Self-pay

## 2020-10-27 ENCOUNTER — Other Ambulatory Visit: Payer: Self-pay | Admitting: *Deleted

## 2020-10-27 DIAGNOSIS — Z95828 Presence of other vascular implants and grafts: Secondary | ICD-10-CM

## 2020-10-27 DIAGNOSIS — I779 Disorder of arteries and arterioles, unspecified: Secondary | ICD-10-CM

## 2020-10-27 NOTE — Telephone Encounter (Signed)
Pt called with c/o "severe burning pain" since last night in her R foot. She states it is swollen and darker in color; warm to touch. She is taking Oxycodone with minimal relief. We have scheduled her for a LE bypass graft duplex and to see APP tomorrow. She has been advised to go to ED if her pain is unbearable. Pt verbalized understanding.

## 2020-10-28 ENCOUNTER — Other Ambulatory Visit: Payer: Self-pay

## 2020-10-28 ENCOUNTER — Ambulatory Visit (INDEPENDENT_AMBULATORY_CARE_PROVIDER_SITE_OTHER)
Admission: RE | Admit: 2020-10-28 | Discharge: 2020-10-28 | Disposition: A | Discharge status: Home / Self Care | Payer: Medicare Other | Source: Ambulatory Visit | Attending: Internal Medicine | Admitting: Internal Medicine

## 2020-10-28 ENCOUNTER — Ambulatory Visit (INDEPENDENT_AMBULATORY_CARE_PROVIDER_SITE_OTHER): Payer: Medicare Other | Admitting: Physician Assistant

## 2020-10-28 ENCOUNTER — Ambulatory Visit (INDEPENDENT_AMBULATORY_CARE_PROVIDER_SITE_OTHER)
Admission: RE | Admit: 2020-10-28 | Discharge: 2020-10-28 | Disposition: A | Discharge status: Home / Self Care | Payer: Medicare Other | Source: Ambulatory Visit | Attending: Vascular Surgery | Admitting: Vascular Surgery

## 2020-10-28 VITALS — BP 151/91 | HR 84 | Temp 98.0°F | Resp 20 | Ht 65.0 in

## 2020-10-28 DIAGNOSIS — I739 Peripheral vascular disease, unspecified: Secondary | ICD-10-CM | POA: Insufficient documentation

## 2020-10-28 DIAGNOSIS — I779 Disorder of arteries and arterioles, unspecified: Secondary | ICD-10-CM | POA: Insufficient documentation

## 2020-10-28 DIAGNOSIS — E785 Hyperlipidemia, unspecified: Secondary | ICD-10-CM | POA: Diagnosis not present

## 2020-10-28 DIAGNOSIS — Z87891 Personal history of nicotine dependence: Secondary | ICD-10-CM | POA: Diagnosis not present

## 2020-10-28 DIAGNOSIS — Z89612 Acquired absence of left leg above knee: Secondary | ICD-10-CM | POA: Diagnosis not present

## 2020-10-28 DIAGNOSIS — Z888 Allergy status to other drugs, medicaments and biological substances status: Secondary | ICD-10-CM | POA: Diagnosis not present

## 2020-10-28 DIAGNOSIS — Z95828 Presence of other vascular implants and grafts: Secondary | ICD-10-CM | POA: Insufficient documentation

## 2020-10-28 DIAGNOSIS — I214 Non-ST elevation (NSTEMI) myocardial infarction: Secondary | ICD-10-CM | POA: Diagnosis not present

## 2020-10-28 DIAGNOSIS — I745 Embolism and thrombosis of iliac artery: Secondary | ICD-10-CM | POA: Diagnosis not present

## 2020-10-28 DIAGNOSIS — Z91040 Latex allergy status: Secondary | ICD-10-CM | POA: Diagnosis not present

## 2020-10-28 DIAGNOSIS — Z006 Encounter for examination for normal comparison and control in clinical research program: Secondary | ICD-10-CM | POA: Diagnosis not present

## 2020-10-28 DIAGNOSIS — I70221 Atherosclerosis of native arteries of extremities with rest pain, right leg: Secondary | ICD-10-CM | POA: Diagnosis not present

## 2020-10-28 DIAGNOSIS — Z20822 Contact with and (suspected) exposure to covid-19: Secondary | ICD-10-CM | POA: Diagnosis not present

## 2020-10-28 DIAGNOSIS — Z955 Presence of coronary angioplasty implant and graft: Secondary | ICD-10-CM | POA: Diagnosis not present

## 2020-10-28 DIAGNOSIS — Z7901 Long term (current) use of anticoagulants: Secondary | ICD-10-CM | POA: Diagnosis not present

## 2020-10-28 DIAGNOSIS — T82898A Other specified complication of vascular prosthetic devices, implants and grafts, initial encounter: Secondary | ICD-10-CM | POA: Diagnosis not present

## 2020-10-28 DIAGNOSIS — I708 Atherosclerosis of other arteries: Secondary | ICD-10-CM | POA: Diagnosis not present

## 2020-10-28 DIAGNOSIS — I743 Embolism and thrombosis of arteries of the lower extremities: Secondary | ICD-10-CM | POA: Diagnosis not present

## 2020-10-28 DIAGNOSIS — I701 Atherosclerosis of renal artery: Secondary | ICD-10-CM | POA: Diagnosis not present

## 2020-10-28 DIAGNOSIS — I252 Old myocardial infarction: Secondary | ICD-10-CM | POA: Diagnosis not present

## 2020-10-28 DIAGNOSIS — T82868A Thrombosis of vascular prosthetic devices, implants and grafts, initial encounter: Secondary | ICD-10-CM | POA: Diagnosis not present

## 2020-10-28 DIAGNOSIS — I70201 Unspecified atherosclerosis of native arteries of extremities, right leg: Secondary | ICD-10-CM | POA: Diagnosis not present

## 2020-10-28 DIAGNOSIS — Z95820 Peripheral vascular angioplasty status with implants and grafts: Secondary | ICD-10-CM | POA: Diagnosis not present

## 2020-10-28 DIAGNOSIS — E0590 Thyrotoxicosis, unspecified without thyrotoxic crisis or storm: Secondary | ICD-10-CM | POA: Diagnosis not present

## 2020-10-28 DIAGNOSIS — Y832 Surgical operation with anastomosis, bypass or graft as the cause of abnormal reaction of the patient, or of later complication, without mention of misadventure at the time of the procedure: Secondary | ICD-10-CM | POA: Diagnosis present

## 2020-10-28 DIAGNOSIS — L97419 Non-pressure chronic ulcer of right heel and midfoot with unspecified severity: Secondary | ICD-10-CM | POA: Diagnosis not present

## 2020-10-28 DIAGNOSIS — Z79899 Other long term (current) drug therapy: Secondary | ICD-10-CM | POA: Diagnosis not present

## 2020-10-28 DIAGNOSIS — I998 Other disorder of circulatory system: Secondary | ICD-10-CM | POA: Diagnosis not present

## 2020-10-28 DIAGNOSIS — Z950 Presence of cardiac pacemaker: Secondary | ICD-10-CM | POA: Diagnosis not present

## 2020-10-28 DIAGNOSIS — I10 Essential (primary) hypertension: Secondary | ICD-10-CM | POA: Diagnosis not present

## 2020-10-28 DIAGNOSIS — I480 Paroxysmal atrial fibrillation: Secondary | ICD-10-CM | POA: Diagnosis not present

## 2020-10-28 DIAGNOSIS — Z89411 Acquired absence of right great toe: Secondary | ICD-10-CM | POA: Diagnosis not present

## 2020-10-28 DIAGNOSIS — D62 Acute posthemorrhagic anemia: Secondary | ICD-10-CM | POA: Diagnosis not present

## 2020-10-28 DIAGNOSIS — I2510 Atherosclerotic heart disease of native coronary artery without angina pectoris: Secondary | ICD-10-CM | POA: Diagnosis not present

## 2020-10-28 NOTE — H&P (View-Only) (Signed)
    Postoperative Visit (Angio)   History of Present Illness   Mercedes [name] is a 65 y.o. female who presents with chief complaint of right lower extremity pain.  She is status post right common femoral endarterectomy and common femoral to below the knee popliteal artery bypass with PTFE by Dr. Cain on 08/24/2020.  Several days later she had a right great toe amputation.  She was last seen in office about a month ago with a bypass duplex negative for hemodynamically significant stenosis.  She states that symptoms of pain and discomfort started this past Saturday on 2/26.  Pain is worse at night and she has noticed some redness in her right foot.  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  She is on Eliquis.  She states she stopped smoking.  Current Outpatient Medications  Medication Sig Dispense Refill  . acetaminophen (TYLENOL) 500 MG tablet Take 500-1,000 mg by mouth every 8 (eight) hours as needed for mild pain or headache.    . ELIQUIS 5 MG TABS tablet TAKE 1 TABLET BY MOUTH TWICE DAILY (Patient taking differently: Take 5 mg by mouth 2 (two) times daily.) 180 tablet 1  . Ensure Max Protein (ENSURE MAX PROTEIN) LIQD Take 8 oz by mouth daily.    . ezetimibe (ZETIA) 10 MG tablet Take 1 tablet (10 mg total) by mouth daily. 90 tablet 3  . metoprolol tartrate (LOPRESSOR) 25 MG tablet Take 0.5 tablets (12.5 mg total) by mouth 2 (two) times daily. 180 tablet 3  . nitroGLYCERIN (NITROSTAT) 0.4 MG SL tablet Place 0.4 mg under the tongue every 4 (four) hours as needed.    . rosuvastatin (CRESTOR) 40 MG tablet Take 40 mg by mouth at bedtime.     No current facility-administered medications for this visit.    ROS: 10 system ROS otherwise negative   For VQI Use Only   PRE-ADM LIVING: Home  AMB STATUS: Ambulatory with Assistance   Physical Examination   Vitals:   10/28/20 1440  BP: (!) 151/91  Pulse: 84  Resp: 20  Temp: 98 F (36.7 C)  TempSrc: Temporal  SpO2: 100%  Height: 5' 5" (1.651 m)    Body mass index is 14.98 kg/m.  General Alert, O x 3, WD, NAD  Pulmonary Sym exp, good B air movt,   Cardiac RRR, Nl S1, S2,   Vascular Vessel Right Left  Radial Palpable Palpable  PT Not palpable AKA  DP Not palpable AKA    Gastrointestinal soft, non-distended, non-tender to palpation,   Musculoskeletal M/S 5/5 throughout  , Extremities without ischemic changes  , No edema present,  Neurologic  Pain and light touch intact in extremities except for decreased sensation in R foot, Motor exam as listed above    Medical Decision Making   Mercedes [name] is a 65 y.o. female who presents 2 months s/p R femoral to popliteal bypass with PTFE and GT amputation.   Arterial duplex demonstrates an occluded femoropopliteal bypass  Plan is for arteriogram with likely initiation of thrombolysis by Dr. Fields tomorrow on 10/29/2020  Patient is aware this is limb threatening and is willing to proceed   Jenelle Drennon PA-C [facility] Office: [phone]  Clinic MD: Fields  

## 2020-10-28 NOTE — Progress Notes (Signed)
    Postoperative Visit (Angio)   History of Present Illness   Mercedes [name] is a 65 y.o. female who presents with chief complaint of right lower extremity pain.  She is status post right common femoral endarterectomy and common femoral to below the knee popliteal artery bypass with PTFE by Dr. Randie Heinz on 08/24/2020.  Several days later she had a right great toe amputation.  She was last seen in office about a month ago with a bypass duplex negative for hemodynamically significant stenosis.  She states that symptoms of pain and discomfort started this past Saturday on 2/26.  Pain is worse at night and she has noticed some redness in her right foot.  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  She is on Eliquis.  She states she stopped smoking.  Current Outpatient Medications  Medication Sig Dispense Refill  . acetaminophen (TYLENOL) 500 MG tablet Take 500-1,000 mg by mouth every 8 (eight) hours as needed for mild pain or headache.    [name] ELIQUIS 5 MG TABS tablet TAKE 1 TABLET BY MOUTH TWICE DAILY (Patient taking differently: Take 5 mg by mouth 2 (two) times daily.) 180 tablet 1  . Ensure Max Protein (ENSURE MAX PROTEIN) LIQD Take 8 oz by mouth daily.    [name] ezetimibe (ZETIA) 10 MG tablet Take 1 tablet (10 mg total) by mouth daily. 90 tablet 3  . metoprolol tartrate (LOPRESSOR) 25 MG tablet Take 0.5 tablets (12.5 mg total) by mouth 2 (two) times daily. 180 tablet 3  . nitroGLYCERIN (NITROSTAT) 0.4 MG SL tablet Place 0.4 mg under the tongue every 4 (four) hours as needed.    . rosuvastatin (CRESTOR) 40 MG tablet Take 40 mg by mouth at bedtime.     No current facility-administered medications for this visit.    ROS: 10 system ROS otherwise negative   For VQI Use Only   PRE-ADM LIVING: Home  AMB STATUS: Ambulatory with Assistance   Physical Examination   Vitals:   10/28/20 1440  BP: (!) 151/91  Pulse: 84  Resp: 20  Temp: 98 F (36.7 C)  TempSrc: Temporal  SpO2: 100%  Height: 5\' 5"  (1.651 m)    Body mass index is 14.98 kg/m.  General Alert, O x 3, WD, NAD  Pulmonary Sym exp, good B air movt,   Cardiac RRR, Nl S1, S2,   Vascular Vessel Right Left  Radial Palpable Palpable  PT Not palpable AKA  DP Not palpable AKA    Gastrointestinal soft, non-distended, non-tender to palpation,   Musculoskeletal M/S 5/5 throughout  , Extremities without ischemic changes  , No edema present,  Neurologic  Pain and light touch intact in extremities except for decreased sensation in R foot, Motor exam as listed above    Medical Decision Making   Mercedes [name] is a 65 y.o. female who presents 2 months s/p R femoral to popliteal bypass with PTFE and GT amputation.   Arterial duplex demonstrates an occluded femoropopliteal bypass  Plan is for arteriogram with likely initiation of thrombolysis by Dr. 71 tomorrow on 10/29/2020  Patient is aware this is limb threatening and is willing to proceed   12/29/2020 PA-C [facility] Office: [phone]  Clinic MD: [phone]

## 2020-10-29 ENCOUNTER — Encounter (HOSPITAL_COMMUNITY)
Admission: RE | Disposition: A | Discharge status: Home Health | Payer: Self-pay | Source: Home / Self Care | Attending: Vascular Surgery

## 2020-10-29 ENCOUNTER — Other Ambulatory Visit: Payer: Self-pay

## 2020-10-29 ENCOUNTER — Inpatient Hospital Stay (HOSPITAL_COMMUNITY)
Admission: RE | Admit: 2020-10-29 | Discharge: 2020-11-08 | DRG: 253 | Disposition: A | Discharge status: Home Health | Payer: Medicare Other | Attending: Vascular Surgery | Admitting: Vascular Surgery

## 2020-10-29 DIAGNOSIS — I214 Non-ST elevation (NSTEMI) myocardial infarction: Secondary | ICD-10-CM | POA: Diagnosis not present

## 2020-10-29 DIAGNOSIS — Z87891 Personal history of nicotine dependence: Secondary | ICD-10-CM | POA: Diagnosis not present

## 2020-10-29 DIAGNOSIS — E0590 Thyrotoxicosis, unspecified without thyrotoxic crisis or storm: Secondary | ICD-10-CM | POA: Diagnosis present

## 2020-10-29 DIAGNOSIS — Z955 Presence of coronary angioplasty implant and graft: Secondary | ICD-10-CM

## 2020-10-29 DIAGNOSIS — Z79899 Other long term (current) drug therapy: Secondary | ICD-10-CM

## 2020-10-29 DIAGNOSIS — I708 Atherosclerosis of other arteries: Secondary | ICD-10-CM | POA: Diagnosis present

## 2020-10-29 DIAGNOSIS — Z20822 Contact with and (suspected) exposure to covid-19: Secondary | ICD-10-CM | POA: Diagnosis present

## 2020-10-29 DIAGNOSIS — I252 Old myocardial infarction: Secondary | ICD-10-CM

## 2020-10-29 DIAGNOSIS — I10 Essential (primary) hypertension: Secondary | ICD-10-CM | POA: Diagnosis present

## 2020-10-29 DIAGNOSIS — I701 Atherosclerosis of renal artery: Secondary | ICD-10-CM | POA: Diagnosis present

## 2020-10-29 DIAGNOSIS — I743 Embolism and thrombosis of arteries of the lower extremities: Secondary | ICD-10-CM | POA: Diagnosis present

## 2020-10-29 DIAGNOSIS — Z888 Allergy status to other drugs, medicaments and biological substances status: Secondary | ICD-10-CM

## 2020-10-29 DIAGNOSIS — I2510 Atherosclerotic heart disease of native coronary artery without angina pectoris: Secondary | ICD-10-CM | POA: Diagnosis present

## 2020-10-29 DIAGNOSIS — I998 Other disorder of circulatory system: Secondary | ICD-10-CM | POA: Diagnosis not present

## 2020-10-29 DIAGNOSIS — Z89612 Acquired absence of left leg above knee: Secondary | ICD-10-CM | POA: Diagnosis not present

## 2020-10-29 DIAGNOSIS — I480 Paroxysmal atrial fibrillation: Secondary | ICD-10-CM | POA: Diagnosis not present

## 2020-10-29 DIAGNOSIS — Z006 Encounter for examination for normal comparison and control in clinical research program: Secondary | ICD-10-CM

## 2020-10-29 DIAGNOSIS — Z95820 Peripheral vascular angioplasty status with implants and grafts: Secondary | ICD-10-CM

## 2020-10-29 DIAGNOSIS — Y832 Surgical operation with anastomosis, bypass or graft as the cause of abnormal reaction of the patient, or of later complication, without mention of misadventure at the time of the procedure: Secondary | ICD-10-CM | POA: Diagnosis present

## 2020-10-29 DIAGNOSIS — Z89411 Acquired absence of right great toe: Secondary | ICD-10-CM

## 2020-10-29 DIAGNOSIS — Z91040 Latex allergy status: Secondary | ICD-10-CM | POA: Diagnosis not present

## 2020-10-29 DIAGNOSIS — E785 Hyperlipidemia, unspecified: Secondary | ICD-10-CM | POA: Diagnosis not present

## 2020-10-29 DIAGNOSIS — T82868A Thrombosis of vascular prosthetic devices, implants and grafts, initial encounter: Principal | ICD-10-CM | POA: Diagnosis present

## 2020-10-29 DIAGNOSIS — I745 Embolism and thrombosis of iliac artery: Secondary | ICD-10-CM | POA: Diagnosis present

## 2020-10-29 DIAGNOSIS — I70201 Unspecified atherosclerosis of native arteries of extremities, right leg: Secondary | ICD-10-CM | POA: Diagnosis present

## 2020-10-29 DIAGNOSIS — E440 Moderate protein-calorie malnutrition: Secondary | ICD-10-CM | POA: Insufficient documentation

## 2020-10-29 DIAGNOSIS — Z950 Presence of cardiac pacemaker: Secondary | ICD-10-CM

## 2020-10-29 DIAGNOSIS — D62 Acute posthemorrhagic anemia: Secondary | ICD-10-CM | POA: Diagnosis not present

## 2020-10-29 DIAGNOSIS — T82898A Other specified complication of vascular prosthetic devices, implants and grafts, initial encounter: Secondary | ICD-10-CM | POA: Diagnosis not present

## 2020-10-29 DIAGNOSIS — L97419 Non-pressure chronic ulcer of right heel and midfoot with unspecified severity: Secondary | ICD-10-CM | POA: Diagnosis present

## 2020-10-29 DIAGNOSIS — Z7901 Long term (current) use of anticoagulants: Secondary | ICD-10-CM | POA: Diagnosis not present

## 2020-10-29 DIAGNOSIS — I70221 Atherosclerosis of native arteries of extremities with rest pain, right leg: Secondary | ICD-10-CM | POA: Diagnosis present

## 2020-10-29 HISTORY — PX: ABDOMINAL AORTOGRAM W/LOWER EXTREMITY: CATH118223

## 2020-10-29 HISTORY — PX: PERIPHERAL VASCULAR INTERVENTION: CATH118257

## 2020-10-29 HISTORY — DX: Other disorder of circulatory system: I99.8

## 2020-10-29 LAB — POCT I-STAT, CHEM 8
BUN: 13 mg/dL (ref 8–23)
Calcium, Ion: 1.26 mmol/L (ref 1.15–1.40)
Chloride: 102 mmol/L (ref 98–111)
Creatinine, Ser: 0.7 mg/dL (ref 0.44–1.00)
Glucose, Bld: 104 mg/dL — ABNORMAL HIGH (ref 70–99)
HCT: 41 % (ref 36.0–46.0)
Hemoglobin: 13.9 g/dL (ref 12.0–15.0)
Potassium: 4.2 mmol/L (ref 3.5–5.1)
Sodium: 137 mmol/L (ref 135–145)
TCO2: 26 mmol/L (ref 22–32)

## 2020-10-29 LAB — POCT ACTIVATED CLOTTING TIME
Activated Clotting Time: 249 seconds
Activated Clotting Time: 196 seconds
Activated Clotting Time: 178 seconds

## 2020-10-29 LAB — SARS CORONAVIRUS 2 BY RT PCR (HOSPITAL ORDER, PERFORMED IN ~~LOC~~ HOSPITAL LAB): SARS Coronavirus 2: NEGATIVE

## 2020-10-29 SURGERY — ABDOMINAL AORTOGRAM W/LOWER EXTREMITY
Anesthesia: LOCAL | Laterality: Right

## 2020-10-29 MED ORDER — HYDRALAZINE HCL 20 MG/ML IJ SOLN
INTRAMUSCULAR | Status: DC | PRN
Start: 2020-10-29 — End: 2020-10-29
  Administered 2020-10-29: 10 mg via INTRAVENOUS

## 2020-10-29 MED ORDER — SODIUM CHLORIDE 0.9 % IV SOLN: INTRAVENOUS | Status: DC

## 2020-10-29 MED ORDER — SODIUM CHLORIDE 0.9 % IV SOLN: 250.0000 mL | INTRAVENOUS | Status: DC | PRN

## 2020-10-29 MED ORDER — MORPHINE SULFATE (PF) 2 MG/ML IV SOLN
INTRAVENOUS | Status: AC
  Filled 2020-10-29: qty 1

## 2020-10-29 MED ORDER — SODIUM CHLORIDE 0.9% FLUSH
3.0000 mL | Freq: Two times a day (BID) | INTRAVENOUS | Status: DC
  Administered 2020-10-31: 3 mL via INTRAVENOUS

## 2020-10-29 MED ORDER — LABETALOL HCL 5 MG/ML IV SOLN
10.0000 mg | INTRAVENOUS | Status: DC | PRN
  Administered 2020-11-01 (×2): 10 mg via INTRAVENOUS
  Filled 2020-10-29 (×2): qty 4

## 2020-10-29 MED ORDER — OXYCODONE HCL 5 MG PO TABS
5.0000 mg | ORAL_TABLET | ORAL | Status: DC | PRN
  Administered 2020-10-29: 15:00:00 5 mg via ORAL
  Administered 2020-10-29 – 2020-11-06 (×16): 10 mg via ORAL
  Filled 2020-10-29 (×17): qty 2

## 2020-10-29 MED ORDER — MIDAZOLAM HCL 2 MG/2ML IJ SOLN
INTRAMUSCULAR | Status: DC | PRN
  Administered 2020-10-29: 1 mg via INTRAVENOUS

## 2020-10-29 MED ORDER — SODIUM CHLORIDE 0.9% FLUSH: 3.0000 mL | INTRAVENOUS | Status: DC | PRN

## 2020-10-29 MED ORDER — MORPHINE SULFATE (PF) 2 MG/ML IV SOLN
2.0000 mg | Freq: Once | INTRAVENOUS | Status: AC
  Administered 2020-10-29: 2 mg via INTRAVENOUS
  Filled 2020-10-29: qty 1

## 2020-10-29 MED ORDER — LIDOCAINE HCL (PF) 1 % IJ SOLN
INTRAMUSCULAR | Status: DC | PRN
  Administered 2020-10-29: 15 mL via INTRADERMAL

## 2020-10-29 MED ORDER — OXYCODONE HCL 5 MG PO TABS
ORAL_TABLET | ORAL | Status: AC
  Filled 2020-10-29: qty 1

## 2020-10-29 MED ORDER — HEPARIN (PORCINE) IN NACL 1000-0.9 UT/500ML-% IV SOLN
INTRAVENOUS | Status: AC
  Filled 2020-10-29: qty 1000

## 2020-10-29 MED ORDER — HEPARIN SODIUM (PORCINE) 1000 UNIT/ML IJ SOLN
INTRAMUSCULAR | Status: DC | PRN
  Administered 2020-10-29: 5000 [IU] via INTRAVENOUS

## 2020-10-29 MED ORDER — ACETAMINOPHEN 325 MG PO TABS
650.0000 mg | ORAL_TABLET | ORAL | Status: DC | PRN
  Administered 2020-10-31: 650 mg via ORAL
  Filled 2020-10-29: qty 2

## 2020-10-29 MED ORDER — FENTANYL CITRATE (PF) 100 MCG/2ML IJ SOLN
INTRAMUSCULAR | Status: DC | PRN
  Administered 2020-10-29: 50 ug via INTRAVENOUS

## 2020-10-29 MED ORDER — HEPARIN SODIUM (PORCINE) 1000 UNIT/ML IJ SOLN
INTRAMUSCULAR | Status: AC
  Filled 2020-10-29: qty 1

## 2020-10-29 MED ORDER — MORPHINE SULFATE (PF) 2 MG/ML IV SOLN
2.0000 mg | INTRAVENOUS | Status: DC | PRN
Start: 2020-10-29 — End: 2020-11-01
  Administered 2020-10-29 – 2020-11-01 (×4): 2 mg via INTRAVENOUS
  Filled 2020-10-29 (×2): qty 1

## 2020-10-29 MED ORDER — HYDRALAZINE HCL 20 MG/ML IJ SOLN
5.0000 mg | INTRAMUSCULAR | Status: AC | PRN
  Administered 2020-11-02: 5 mg via INTRAVENOUS
  Administered 2020-11-02: 10 mg via INTRAVENOUS

## 2020-10-29 MED ORDER — IODIXANOL 320 MG/ML IV SOLN
INTRAVENOUS | Status: DC | PRN
  Administered 2020-10-29: 170 mL via INTRA_ARTERIAL

## 2020-10-29 MED ORDER — HEPARIN (PORCINE) 25000 UT/250ML-% IV SOLN
550.0000 [IU]/h | INTRAVENOUS | Status: DC
  Filled 2020-10-29: qty 250

## 2020-10-29 MED ORDER — MIDAZOLAM HCL 2 MG/2ML IJ SOLN
INTRAMUSCULAR | Status: AC
  Filled 2020-10-29: qty 2

## 2020-10-29 MED ORDER — ONDANSETRON HCL 4 MG/2ML IJ SOLN: 4.0000 mg | Freq: Four times a day (QID) | INTRAMUSCULAR | Status: DC | PRN

## 2020-10-29 MED ORDER — LIDOCAINE HCL (PF) 1 % IJ SOLN
INTRAMUSCULAR | Status: AC
  Filled 2020-10-29: qty 30

## 2020-10-29 MED ORDER — HEPARIN (PORCINE) IN NACL 1000-0.9 UT/500ML-% IV SOLN
INTRAVENOUS | Status: DC | PRN
  Administered 2020-10-29 (×2): 500 mL

## 2020-10-29 MED ORDER — FENTANYL CITRATE (PF) 100 MCG/2ML IJ SOLN
INTRAMUSCULAR | Status: AC
  Filled 2020-10-29: qty 2

## 2020-10-29 MED ORDER — HYDRALAZINE HCL 20 MG/ML IJ SOLN
INTRAMUSCULAR | Status: AC
  Filled 2020-10-29: qty 1

## 2020-10-29 MED ORDER — HEPARIN (PORCINE) 25000 UT/250ML-% IV SOLN
1100.0000 [IU]/h | INTRAVENOUS | Status: DC
  Administered 2020-10-29: 550 [IU]/h via INTRAVENOUS
  Administered 2020-10-31: 900 [IU]/h via INTRAVENOUS
  Administered 2020-11-01: 1100 [IU]/h via INTRAVENOUS
  Filled 2020-10-29 (×4): qty 250

## 2020-10-29 SURGICAL SUPPLY — 12 items
CATH ANGIO 5F PIGTAIL 65CM (CATHETERS) ×3 IMPLANT
KIT ENCORE 26 ADVANTAGE (KITS) ×3 IMPLANT
KIT PV (KITS) ×3 IMPLANT
SHEATH BRITE TIP 7FR 35CM (SHEATH) ×3 IMPLANT
SHEATH PINNACLE 5F 10CM (SHEATH) ×3 IMPLANT
SHEATH PROBE COVER 6X72 (BAG) ×3 IMPLANT
STENT VIABAHN 8X39X80 VBX (Permanent Stent) ×3 IMPLANT
SYR MEDRAD MARK V 150ML (SYRINGE) ×3 IMPLANT
TRANSDUCER W/STOPCOCK (MISCELLANEOUS) ×3 IMPLANT
TRAY PV CATH (CUSTOM PROCEDURE TRAY) ×3 IMPLANT
WIRE HITORQ VERSACORE ST 145CM (WIRE) ×3 IMPLANT
WIRE STARTER BENTSON 035X150 (WIRE) ×3 IMPLANT

## 2020-10-29 NOTE — Op Note (Signed)
Procedure: Abdominal aortogram with right lower extremity runoff, right common iliac stent (8 x 39 VBX)  Preoperative diagnosis: Right leg ischemia  Postoperative diagnosis: Same  Anesthesia: Local with IV sedation  Sedation time: 34 minutes  Operative findings: 1.  70% stenosis right common iliac artery stented to 0% residual stenosis with 8 x 39 VBX  2.  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  Chronic occlusion right internal iliac artery  4.  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  Occlusion right femoral to below-knee popliteal artery  6.  One-vessel peroneal runoff which reconstitutes the anterior tibial giving a dorsalis pedis to the foot  Operative details: After obtaining informed consent, patient is taken to the PV lab.  Patient was placed in supine position on the angio table.  Both groins were prepped and draped in usual sterile fashion.  Local anesthesia was infiltrated over the right common femoral artery.  The procedure was initially intended to possibly be a thrombolytic procedure but preoperative angiogram had shown the patient had a chronic left iliac occlusion.  Therefore I decided to cannulate the right common femoral artery for diagnostic purposes.  Ultrasound was used to identify the right common femoral artery as well as the femoral bifurcation and the origin of a PTFE graft.  Using ultrasound guidance local anesthesia was infiltrated over this area.  An introducer needle was then used to cannulate the right common femoral artery and an 035 versa core wire threaded in the abdominal aorta under fluoroscopic guidance.  A 5 French sheath placed over the guidewire in the right common femoral artery.  This was thoroughly flushed with heparinized saline.  5 French pigtail catheter was then advanced over the guidewire and abdominal aortogram was obtained in AP projection.  There is an 80% stenosis of the left renal artery.  The right renal  arteries widely patent.  The infrarenal abdominal aorta is patent.  There is a flush occlusion of the left common iliac artery as well as the external iliac and internal iliac artery on the left side.  The inferior mesenteric artery is visualized and is patent.  The right common iliac artery is patent but there is calcified stenosis estimated at around 70% in its proximal to midportion.  The right external iliac artery is patent.  The right common femoral artery is patent.  Next bilateral oblique views of the pelvis were obtained to further clarify the iliac stenosis on the right side.  This confirmed the above findings.  Next we did lower extremity runoff using the right leg.  This showed the right common femoral artery was widely patent.  The right profunda femoris is widely patent.  The superficial femoral artery is occluded at its origin.  There is no bypass graft visualized presumably occluded.  The patient then reconstitutes via profunda collaterals a peroneal artery which probably comes back in just below the below-knee popliteal artery.  This peroneal artery is a fairly large caliber where reasonable quality vessel.  It then reconstitutes the anterior tibial artery by branches.  The anterior tibial artery fills from the mid to distal leg all the way into the dorsalis pedis artery.  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  At this point I decided to intervene on the iliac stenosis.  The patient was given 5000 inch of intravenous heparin.  The 5 French sheath was swapped out over a wire for a 7 [place] Brite tip sheath.  This was  advanced up to the mid iliac system.  We then obtained roadmapping and measurements with angiography.  An 8 x 39 VBX stent was selected.  This was then centered on the lesion and deployed with its origin just into the abdominal aorta.  It was inflated to nominal pressure for 30 seconds.  Completion angiogram showed the stent was well deployed the right iliac system  was widely patent and the inflow was significantly improved.  1 additional view of the external iliac artery was obtained with angiography to make sure there were no other lesions downstream from this now that we had improved inflow.  Right external iliac artery appeared to be widely patent.  Patient's ACT was 249.  The sheath was left in place to be pulled after the ACT is less than 175.  Patient tolerated the procedure well and there were no complications.  Operative management: The patient still has an occluded right femoral-popliteal bypass.  Her inflow is as good as it is going to get.  Plan will be to return to the operating room on Monday for either antegrade puncture and thrombolysis versus open thrombectomy and revision of her femoropopliteal bypass.  She will be maintained on a heparin drip over the weekend.  She will not be on started on Plavix for now since her operation is pending on Monday.  I attempted to call the patient's sister and granddaughter and got voicemail.  I did leave a message for both of them.  I also received voicemail for the patient's daughter.  Fabienne Bruns, MD [facility] Office: [phone]

## 2020-10-29 NOTE — Interval H&P Note (Signed)
History and Physical Interval Note:  10/29/2020 7:44 AM  Mercedes [name]  has presented today for surgery, with the diagnosis of critical limb ischemia.  The various methods of treatment have been discussed with the patient and family. After consideration of risks, benefits and other options for treatment, the patient has consented to  Procedure(s): ABDOMINAL AORTOGRAM W/LOWER EXTREMITY (N/A) as a surgical intervention.  The patient's history has been reviewed, patient examined, no change in status, stable for surgery.  I have reviewed the patient's chart and labs.  Questions were answered to the patient's satisfaction.    Discussion held with patient that she is at high risk for limb loss.  She has had multiple interventions in the past and now has a prosthetic bypass with only single-vessel runoff.  We will attempt thrombolysis today.  She understands the risks of bleeding associated with this.  Her most recent ABI yesterday was 0.4.  I believe thrombolysis is her best alternative for now for limb salvage.  She understands and agrees to proceed.   Fabienne Bruns

## 2020-10-29 NOTE — Progress Notes (Signed)
Right femoral artery sheath removed and pressure held for 20 minutes. Right groin is level zero, distal pulse heard by doppler is peroneal. Signal is faint but audible.4 hours bedrest begins at 11:30 AM. Patient given downtime  instructions and verbalizes understanding these instructions.

## 2020-10-29 NOTE — Progress Notes (Signed)
ANTICOAGULATION CONSULT NOTE - Follow Up Consult  Pharmacy Consult for Heparin Indication: Critical limb ischemia s/p intervention  Allergies  Allergen Reactions  . Brilinta [Ticagrelor] Anaphylaxis, Shortness Of Breath and Other (See Comments)    Also, chest tightness  . Latex Rash  . Tape Rash    Patient Measurements: Height: 5\' 5"  (165.1 cm) IBW/kg (Calculated) : 57 Heparin Dosing Weight:  40.4kg  Vital Signs: Temp: 98.7 F (37.1 C) (03/04 1634) Temp Source: Oral (03/04 1634) BP: 133/76 (03/04 1634) Pulse Rate: 74 (03/04 1634)  Labs: Recent Labs    10/29/20 0640  HGB 13.9  HCT 41.0  CREATININE 0.70    CrCl cannot be calculated (Unknown ideal weight.).   Assessment: CC/HPI: R LE pain with critical limb ischemia  PMH: L AKA  Significant events: status post right common femoral endarterectomy and common femoral to below the knee popliteal artery bypass with PTFE by Dr. 12/29/20 on 08/24/2020.  Several days later she had a right great toe amputation.   Anticoag: Eliquis PtA (LD 3/2). R leg ischemia with occluded femoropopliteal bypass s/p abdom aortogram wot RLE runoff, and R common iliac stent.  - 1130 Sheath out.  Goal of Therapy:  Heparin level 0.3-0.7 units/ml  APTT 66-102 Monitor platelets by anticoagulation protocol: Yes   Plan:  Start IV heparin with no bolus at 1730 at a rate of 550 units/hr Check heparin level and aPTT in 6 hrs Daily aPTT, HL, and CBC   Sarahy Creedon S. 08/26/2020, PharmD, BCPS Clinical Staff Pharmacist [email or web address] Merilynn Finland Stillinger 10/29/2020,5:03 PM

## 2020-10-29 NOTE — Progress Notes (Signed)
Hep gtt was to start at 5:30 pm. Still awaiting for dose to arrive to unit. Will start once it arrives.

## 2020-10-30 LAB — HEPARIN LEVEL (UNFRACTIONATED)
Heparin Unfractionated: <0.1 IU/mL — ABNORMAL LOW (ref 0.30–0.70)
Heparin Unfractionated: 0.19 IU/mL — ABNORMAL LOW (ref 0.30–0.70)
Heparin Unfractionated: 0.29 IU/mL — ABNORMAL LOW (ref 0.30–0.70)
Heparin Unfractionated: 0.31 IU/mL (ref 0.30–0.70)

## 2020-10-30 LAB — CBC
HCT: 36.7 % (ref 36.0–46.0)
Hemoglobin: 11.5 g/dL — ABNORMAL LOW (ref 12.0–15.0)
MCH: 24.4 pg — ABNORMAL LOW (ref 26.0–34.0)
MCHC: 31.3 g/dL (ref 30.0–36.0)
MCV: 77.9 fL — ABNORMAL LOW (ref 80.0–100.0)
Platelets: 354 10*3/uL (ref 150–400)
RBC: 4.71 MIL/uL (ref 3.87–5.11)
RDW: 17.4 % — ABNORMAL HIGH (ref 11.5–15.5)
WBC: 7.8 10*3/uL (ref 4.0–10.5)
nRBC: 0 % (ref 0.0–0.2)

## 2020-10-30 LAB — BASIC METABOLIC PANEL
Anion gap: 10 (ref 5–15)
BUN: 8 mg/dL (ref 8–23)
CO2: 20 mmol/L — ABNORMAL LOW (ref 22–32)
Calcium: 9.3 mg/dL (ref 8.9–10.3)
Chloride: 102 mmol/L (ref 98–111)
Creatinine, Ser: 0.71 mg/dL (ref 0.44–1.00)
GFR, Estimated: >60 mL/min (ref >60)
Glucose, Bld: 105 mg/dL — ABNORMAL HIGH (ref 70–99)
Potassium: 3.7 mmol/L (ref 3.5–5.1)
Sodium: 132 mmol/L — ABNORMAL LOW (ref 135–145)

## 2020-10-30 LAB — APTT: aPTT: 36 seconds (ref 24–36)

## 2020-10-30 MED ORDER — TROLAMINE SALICYLATE 10 % EX CREA
TOPICAL_CREAM | CUTANEOUS | Status: DC | PRN
  Filled 2020-10-30 (×2): qty 85

## 2020-10-30 MED ORDER — MUSCLE RUB 10-15 % EX CREA
TOPICAL_CREAM | CUTANEOUS | Status: DC | PRN
  Administered 2020-10-30: 1 via TOPICAL
  Filled 2020-10-30: qty 85

## 2020-10-30 NOTE — Progress Notes (Addendum)
ANTICOAGULATION CONSULT NOTE - Follow Up Consult  Pharmacy Consult for Heparin Indication: Critical limb ischemia s/p intervention  Allergies  Allergen Reactions  . Brilinta [Ticagrelor] Anaphylaxis, Shortness Of Breath and Other (See Comments)    Also, chest tightness  . Latex Rash  . Tape Rash    Patient Measurements: Height: 5\' 5"  (165.1 cm) Weight: 40.4 kg (89 lb 1.1 oz) IBW/kg (Calculated) : 57 Heparin Dosing Weight:  40.4kg  Vital Signs: Temp: 99.6 F (37.6 C) (03/05 0032) Temp Source: Oral (03/05 0032) BP: 159/84 (03/05 0032) Pulse Rate: 84 (03/04 2106)  Labs: Recent Labs    10/29/20 0640 10/30/20 0040  HGB 13.9  --   HCT 41.0  --   APTT  --  36  CREATININE 0.70  --     Estimated Creatinine Clearance: 44.1 mL/min (by C-G formula based on SCr of 0.7 mg/dL).   Assessment: CC/HPI: R LE pain with critical limb ischemia  PMH: L AKA  Significant events: status post right common femoral endarterectomy and common femoral to below the knee popliteal artery bypass with PTFE by Dr. 12/30/20 on 08/24/2020.  Several days later she had a right great toe amputation.   Anticoag: Eliquis PTA (Last dose 3/2). S/p R leg ischemia with occluded femoropopliteal bypass s/p abdom aortogram with RLE runoff, and R common iliac stent on 3/4.  Heparin restarted post procedure.  Heparin level undetectable, aPTT 36 sec (subtherapeutic) on gtt at 550 units/hr. Heparin level and aPTT correlating so will just use heparin levels for monitoring. No issues with line or bleeding reported per RN.  Goal of Therapy:  Heparin level 0.3-0.7 units/ml  Monitor platelets by anticoagulation protocol: Yes   Plan:  Increase IV heparin to 700 units/hr Check Heparin level in 6 hrs  5/4, PharmD, BCPS Please see amion for complete clinical pharmacist phone list 10/30/2020,1:45 AM

## 2020-10-30 NOTE — Progress Notes (Addendum)
  Progress Note    10/30/2020 8:53 AM 1 Day Post-Op  Subjective:  Rest pain R foot and heel   Vitals:   10/30/20 0427 10/30/20 0744  BP: (!) 158/94 113/74  Pulse: 86 73  Resp: 16 17  Temp: 100.1 F (37.8 C) 99 F (37.2 C)  SpO2: 95% 99%   Physical Exam: Lungs:  Non labored Extremities:  R foot warm to touch Neurologic: a&O  CBC    Component Value Date/Time   WBC 7.8 10/30/2020 0256   RBC 4.71 10/30/2020 0256   HGB 11.5 (L) 10/30/2020 0256   HGB 14.2 10/09/2018 1125   HCT 36.7 10/30/2020 0256   HCT 44.6 10/09/2018 1125   PLT 354 10/30/2020 0256   PLT 289 10/09/2018 1125   MCV 77.9 (L) 10/30/2020 0256   MCV 79 10/09/2018 1125   MCH 24.4 (L) 10/30/2020 0256   MCHC 31.3 10/30/2020 0256   RDW 17.4 (H) 10/30/2020 0256   RDW 14.8 10/09/2018 1125   LYMPHSABS 1.7 08/27/2020 0056   MONOABS 0.4 08/27/2020 0056   EOSABS 0.4 08/27/2020 0056   BASOSABS 0.0 08/27/2020 0056    BMET    Component Value Date/Time   NA 132 (L) 10/30/2020 0256   NA 135 10/09/2018 1125   K 3.7 10/30/2020 0256   CL 102 10/30/2020 0256   CO2 20 (L) 10/30/2020 0256   GLUCOSE 105 (H) 10/30/2020 0256   BUN 8 10/30/2020 0256   BUN 10 10/09/2018 1125   CREATININE 0.71 10/30/2020 0256   CREATININE 1.13 (H) 08/03/2016 1440   CALCIUM 9.3 10/30/2020 0256   GFRNONAA >60 10/30/2020 0256   GFRAA >60 11/24/2019 0620    INR    Component Value Date/Time   INR 1.0 11/18/2019 1844     Intake/Output Summary (Last 24 hours) at 10/30/2020 0853 Last data filed at 10/30/2020 0600 Gross per 24 hour  Intake 311 ml  Output 400 ml  Net -89 ml     Assessment/Plan:  65 y.o. female with occluded R fem-pop bypass 1 Day Post-Op   R foot warm on exam despite occluded bypass graft Patient is aware she is high risk for AKA; she continues to have rest pain in foot and heel however not willing to proceed with AKA at this time Plan is for antegrade puncture and thrombolysis versus open thrombectomy on  Monday Continue heparin  Emilie Rutter, PA-C [facility] [phone] 10/30/2020 8:53 AM  VASCULAR STAFF ADDENDUM: I have independently interviewed and examined the patient. I agree with the above.   Rande Brunt. Lenell Antu, MD [facility] Phone Number: [phone] 10/30/2020 10:24 AM

## 2020-10-30 NOTE — Progress Notes (Signed)
ANTICOAGULATION CONSULT NOTE - Follow Up Consult  Pharmacy Consult for Heparin Indication: Critical limb ischemia s/p intervention  Allergies  Allergen Reactions  . Brilinta [Ticagrelor] Anaphylaxis, Shortness Of Breath and Other (See Comments)    Also, chest tightness  . Latex Rash  . Tape Rash    Patient Measurements: Height: 5\' 5"  (165.1 cm) Weight: 40.4 kg (89 lb 1.1 oz) IBW/kg (Calculated) : 57 Heparin Dosing Weight:  40.4kg  Vital Signs: Temp: 99 F (37.2 C) (03/05 0744) Temp Source: Oral (03/05 0744) BP: 113/74 (03/05 0744) Pulse Rate: 73 (03/05 0744)  Labs: Recent Labs    10/29/20 0640 10/30/20 0040 10/30/20 0256 10/30/20 0731  HGB 13.9  --  11.5*  --   HCT 41.0  --  36.7  --   PLT  --   --  354  --   APTT  --  36  --   --   HEPARINUNFRC  --  <0.10*  --  0.19*  CREATININE 0.70  --  0.71  --     Estimated Creatinine Clearance: 44.1 mL/min (by C-G formula based on SCr of 0.71 mg/dL).   Assessment: 41 YOF s/p R leg ischemia with occluded femoropopliteal bypass s/p abdom aortogram with RLE runoff, and R common iliac stent on 3/4. Heparin restarted post procedure. On Eliquis PTA (Last dose 3/2).   Heparin level subtherapeutic at 0.19 after rate increase to 700 units/hr. No issues with line or bleeding reported per RN. Hgb 11.5, PLTs 354.  Goal of Therapy:  Heparin level 0.3-0.7 units/ml  Monitor platelets by anticoagulation protocol: Yes   Plan:  Increase IV heparin to 850 units/hr Check Heparin level in 6 hrs Daily Heparin level, CBC  5/4, PharmD PGY1 Acute Care Pharmacy Resident 10/30/2020 8:31 AM  Please check [email or web address] for unit specific pharmacy phone numbers.

## 2020-10-30 NOTE — Progress Notes (Addendum)
ANTICOAGULATION CONSULT NOTE - Follow Up Consult  Pharmacy Consult for Heparin Indication: Critical limb ischemia s/p intervention   Labs: Recent Labs    10/29/20 0640 10/30/20 0040 10/30/20 0040 10/30/20 0256 10/30/20 0731 10/30/20 1453 10/30/20 2209  HGB 13.9  --   --  11.5*  --   --   --   HCT 41.0  --   --  36.7  --   --   --   PLT  --   --   --  354  --   --   --   APTT  --  36  --   --   --   --   --   HEPARINUNFRC  --  <0.10*   < >  --  0.19* 0.29* 0.31  CREATININE 0.70  --   --  0.71  --   --   --    < > = values in this interval not displayed.    Assessment: 59 YOF s/p R leg ischemia with occluded femoropopliteal bypass s/p abdom aortogram with RLE runoff, and R common iliac stent on 3/4. Heparin restarted post procedure. On Eliquis PTA (Last dose 3/2).   Heparin level now 0.31 units/ml  Goal of Therapy:  Heparin level 0.3-0.7 units/ml  Monitor platelets by anticoagulation protocol: Yes   Plan:  Continue IV heparin at 900 units/hr Daily Heparin level, CBC  Thanks for allowing pharmacy to be a part of this patient's care.  Talbert Cage, PharmD Clinical Pharmacist Addum:  Am heparin level 0.25 units/ml.  Increase heparin to 1000 units/hr and rechek level in 6-8 hours

## 2020-10-30 NOTE — Plan of Care (Signed)
  Problem: Education: Goal: Knowledge of General Education information will improve Description: Including pain rating scale, medication(s)/side effects and non-pharmacologic comfort measures Outcome: Completed/Met   Problem: Clinical Measurements: Goal: Ability to maintain clinical measurements within normal limits will improve Outcome: Progressing   Problem: Clinical Measurements: Goal: Will remain free from infection Outcome: Progressing   Problem: Clinical Measurements: Goal: Respiratory complications will improve Outcome: Progressing   Problem: Clinical Measurements: Goal: Cardiovascular complication will be avoided Outcome: Progressing   Problem: Activity: Goal: Risk for activity intolerance will decrease Outcome: Progressing   Problem: Nutrition: Goal: Adequate nutrition will be maintained Outcome: Progressing   Problem: Coping: Goal: Level of anxiety will decrease Outcome: Progressing

## 2020-10-30 NOTE — Progress Notes (Signed)
ANTICOAGULATION CONSULT NOTE - Follow Up Consult  Pharmacy Consult for Heparin Indication: Critical limb ischemia s/p intervention  Allergies  Allergen Reactions  . Brilinta [Ticagrelor] Anaphylaxis, Shortness Of Breath and Other (See Comments)    Also, chest tightness  . Latex Rash  . Tape Rash    Patient Measurements: Height: 5\' 5"  (165.1 cm) Weight: 40.4 kg (89 lb 1.1 oz) IBW/kg (Calculated) : 57 Heparin Dosing Weight:  40.4kg  Vital Signs: Temp: 99.8 F (37.7 C) (03/05 1100) Temp Source: Oral (03/05 1100) BP: 119/73 (03/05 1100) Pulse Rate: 85 (03/05 1100)  Labs: Recent Labs    10/29/20 0640 10/30/20 0040 10/30/20 0256 10/30/20 0731 10/30/20 1453  HGB 13.9  --  11.5*  --   --   HCT 41.0  --  36.7  --   --   PLT  --   --  354  --   --   APTT  --  36  --   --   --   HEPARINUNFRC  --  <0.10*  --  0.19* 0.29*  CREATININE 0.70  --  0.71  --   --     Estimated Creatinine Clearance: 44.1 mL/min (by C-G formula based on SCr of 0.71 mg/dL).   Assessment: 34 YOF s/p R leg ischemia with occluded femoropopliteal bypass s/p abdom aortogram with RLE runoff, and R common iliac stent on 3/4. Heparin restarted post procedure. On Eliquis PTA (Last dose 3/2).   Heparin level slightly subtherapeutic at 0.29 after rate increase to 850 units/hr. No issues with line or bleeding reported per RN  Goal of Therapy:  Heparin level 0.3-0.7 units/ml  Monitor platelets by anticoagulation protocol: Yes   Plan:  Increase IV heparin to 900 units/hr Check Heparin level in 6 hrs Daily Heparin level, CBC  5/4, PharmD., BCPS, BCCCP Clinical Pharmacist Please refer to [facility] for unit-specific pharmacist

## 2020-10-31 LAB — CBC
HCT: 32.9 % — ABNORMAL LOW (ref 36.0–46.0)
Hemoglobin: 10.8 g/dL — ABNORMAL LOW (ref 12.0–15.0)
MCH: 25.2 pg — ABNORMAL LOW (ref 26.0–34.0)
MCHC: 32.8 g/dL (ref 30.0–36.0)
MCV: 76.9 fL — ABNORMAL LOW (ref 80.0–100.0)
Platelets: 377 10*3/uL (ref 150–400)
RBC: 4.28 MIL/uL (ref 3.87–5.11)
RDW: 17.3 % — ABNORMAL HIGH (ref 11.5–15.5)
WBC: 8.5 10*3/uL (ref 4.0–10.5)
nRBC: 0 % (ref 0.0–0.2)

## 2020-10-31 LAB — HEPARIN LEVEL (UNFRACTIONATED)
Heparin Unfractionated: 0.25 IU/mL — ABNORMAL LOW (ref 0.30–0.70)
Heparin Unfractionated: 0.39 IU/mL (ref 0.30–0.70)

## 2020-10-31 MED ORDER — DICLOFENAC SODIUM 1 % EX GEL
2.0000 g | Freq: Four times a day (QID) | CUTANEOUS | Status: DC | PRN
  Administered 2020-10-31 (×2): 2 g via TOPICAL
  Filled 2020-10-31: qty 100

## 2020-10-31 NOTE — Progress Notes (Signed)
ANTICOAGULATION CONSULT NOTE - Follow Up Consult  Pharmacy Consult for Heparin Indication: Critical limb ischemia s/p intervention   Labs: Recent Labs    10/29/20 0640 10/30/20 0040 10/30/20 0256 10/30/20 0731 10/30/20 2209 10/31/20 0255 10/31/20 1100  HGB 13.9  --  11.5*  --   --  10.8*  --   HCT 41.0  --  36.7  --   --  32.9*  --   PLT  --   --  354  --   --  377  --   APTT  --  36  --   --   --   --   --   HEPARINUNFRC  --  <0.10*  --    < > 0.31 0.25* 0.39  CREATININE 0.70  --  0.71  --   --   --   --    < > = values in this interval not displayed.    Assessment: 28 YOF s/p R leg ischemia with occluded femoropopliteal bypass s/p abdom aortogram with RLE runoff, and R common iliac stent on 3/4. Heparin restarted post procedure. On Eliquis PTA (Last dose 3/2).   Heparin level is now therapeutic at 0.39 on heparin 1000 units/hr. Hgb 10.8, PLTs WNL. No issues with infusion or bleeding. Planning for surgery tomorrow.   Goal of Therapy:  Heparin level 0.3-0.7 units/ml  Monitor platelets by anticoagulation protocol: Yes   Plan:  Heparin 1000 units/hr Daily Heparin level, CBC  Kinnie Feil, PharmD PGY1 Acute Care Pharmacy Resident 10/31/2020 12:00 PM  Please check [email or web address] for unit specific pharmacy phone numbers.

## 2020-10-31 NOTE — Progress Notes (Addendum)
  Progress Note    10/31/2020 9:40 AM 2 Days Post-Op  Subjective:  Rest pain RLE   Vitals:   10/31/20 0437 10/31/20 0921  BP: 140/69 137/63  Pulse: 75 75  Resp: 20 10  Temp: 99.6 F (37.6 C) 98.7 F (37.1 C)  SpO2: 94% 97%   Physical Exam: Lungs:  Non labored Extremities:  Foot warm to touch; pain to palpation of R heel Neurologic: A&O  CBC    Component Value Date/Time   WBC 8.5 10/31/2020 0255   RBC 4.28 10/31/2020 0255   HGB 10.8 (L) 10/31/2020 0255   HGB 14.2 10/09/2018 1125   HCT 32.9 (L) 10/31/2020 0255   HCT 44.6 10/09/2018 1125   PLT 377 10/31/2020 0255   PLT 289 10/09/2018 1125   MCV 76.9 (L) 10/31/2020 0255   MCV 79 10/09/2018 1125   MCH 25.2 (L) 10/31/2020 0255   MCHC 32.8 10/31/2020 0255   RDW 17.3 (H) 10/31/2020 0255   RDW 14.8 10/09/2018 1125   LYMPHSABS 1.7 08/27/2020 0056   MONOABS 0.4 08/27/2020 0056   EOSABS 0.4 08/27/2020 0056   BASOSABS 0.0 08/27/2020 0056    BMET    Component Value Date/Time   NA 132 (L) 10/30/2020 0256   NA 135 10/09/2018 1125   K 3.7 10/30/2020 0256   CL 102 10/30/2020 0256   CO2 20 (L) 10/30/2020 0256   GLUCOSE 105 (H) 10/30/2020 0256   BUN 8 10/30/2020 0256   BUN 10 10/09/2018 1125   CREATININE 0.71 10/30/2020 0256   CREATININE 1.13 (H) 08/03/2016 1440   CALCIUM 9.3 10/30/2020 0256   GFRNONAA >60 10/30/2020 0256   GFRAA >60 11/24/2019 0620    INR    Component Value Date/Time   INR 1.0 11/18/2019 1844     Intake/Output Summary (Last 24 hours) at 10/31/2020 0940 Last data filed at 10/31/2020 0105 Gross per 24 hour  Intake 161.89 ml  Output --  Net 161.89 ml     Assessment/Plan:  65 y.o. female with occluded R fem pop bypass 2 Days Post-Op   Plan is for antegrade stick arteriogram for thrombolysis versus open thrombectomy of right leg tomorrow by Dr. Darrick Penna 11/01/20 Patient would like a social work consult if ultimately AKA is required NPO past midnight Consent Continue heparin  pre-operatively   Emilie Rutter, PA-C [facility] [phone] 10/31/2020 9:40 AM   VASCULAR STAFF ADDENDUM: I have independently interviewed and examined the patient. I agree with the above.  OR tomorrow.  Rande Brunt. Lenell Antu, MD [facility] Phone Number: [phone] 10/31/2020 10:10 AM

## 2020-10-31 NOTE — Progress Notes (Signed)
Applied voltaren to right foot and lower anterior leg.  Patient tolerated well and will try to have applied approximately hour after oxycodone dose given. Pt resting with call bell within reach.  Will continue to monitor.

## 2020-11-01 ENCOUNTER — Encounter (HOSPITAL_COMMUNITY)
Admission: RE | Disposition: A | Discharge status: Home Health | Payer: Medicare Other | Source: Home / Self Care | Attending: Vascular Surgery

## 2020-11-01 ENCOUNTER — Inpatient Hospital Stay (HOSPITAL_COMMUNITY): Payer: Medicare Other | Admitting: Certified Registered"

## 2020-11-01 ENCOUNTER — Encounter (HOSPITAL_COMMUNITY)
Admission: RE | Disposition: A | Discharge status: Home Health | Payer: Self-pay | Source: Home / Self Care | Attending: Vascular Surgery

## 2020-11-01 ENCOUNTER — Inpatient Hospital Stay (HOSPITAL_COMMUNITY): Payer: Medicare Other

## 2020-11-01 ENCOUNTER — Inpatient Hospital Stay (HOSPITAL_COMMUNITY): Payer: Medicare Other | Admitting: Anesthesiology

## 2020-11-01 ENCOUNTER — Encounter (HOSPITAL_COMMUNITY): Payer: Self-pay | Admitting: Vascular Surgery

## 2020-11-01 DIAGNOSIS — T82868A Thrombosis of vascular prosthetic devices, implants and grafts, initial encounter: Secondary | ICD-10-CM | POA: Diagnosis not present

## 2020-11-01 DIAGNOSIS — I998 Other disorder of circulatory system: Secondary | ICD-10-CM | POA: Diagnosis not present

## 2020-11-01 DIAGNOSIS — Z006 Encounter for examination for normal comparison and control in clinical research program: Secondary | ICD-10-CM | POA: Diagnosis not present

## 2020-11-01 DIAGNOSIS — L97419 Non-pressure chronic ulcer of right heel and midfoot with unspecified severity: Secondary | ICD-10-CM | POA: Diagnosis not present

## 2020-11-01 DIAGNOSIS — D62 Acute posthemorrhagic anemia: Secondary | ICD-10-CM | POA: Diagnosis not present

## 2020-11-01 DIAGNOSIS — I70201 Unspecified atherosclerosis of native arteries of extremities, right leg: Secondary | ICD-10-CM

## 2020-11-01 HISTORY — PX: THROMBECTOMY OF BYPASS GRAFT FEMORAL- POPLITEAL ARTERY: SHX6902

## 2020-11-01 HISTORY — PX: INTRAOPERATIVE ARTERIOGRAM: SHX5157

## 2020-11-01 HISTORY — PX: FEMORAL-POPLITEAL BYPASS GRAFT: SHX937

## 2020-11-01 LAB — CBC
HCT: 32 % — ABNORMAL LOW (ref 36.0–46.0)
HCT: 37.1 % (ref 36.0–46.0)
Hemoglobin: 10.3 g/dL — ABNORMAL LOW (ref 12.0–15.0)
Hemoglobin: 11.3 g/dL — ABNORMAL LOW (ref 12.0–15.0)
MCH: 24.8 pg — ABNORMAL LOW (ref 26.0–34.0)
MCH: 24.2 pg — ABNORMAL LOW (ref 26.0–34.0)
MCHC: 32.2 g/dL (ref 30.0–36.0)
MCHC: 30.5 g/dL (ref 30.0–36.0)
MCV: 77.1 fL — ABNORMAL LOW (ref 80.0–100.0)
MCV: 79.6 fL — ABNORMAL LOW (ref 80.0–100.0)
Platelets: 364 10*3/uL (ref 150–400)
Platelets: 351 10*3/uL (ref 150–400)
RBC: 4.15 MIL/uL (ref 3.87–5.11)
RBC: 4.66 MIL/uL (ref 3.87–5.11)
RDW: 17.2 % — ABNORMAL HIGH (ref 11.5–15.5)
RDW: 17.2 % — ABNORMAL HIGH (ref 11.5–15.5)
WBC: 7.6 10*3/uL (ref 4.0–10.5)
WBC: 11.2 10*3/uL — ABNORMAL HIGH (ref 4.0–10.5)
nRBC: 0 % (ref 0.0–0.2)
nRBC: 0 % (ref 0.0–0.2)

## 2020-11-01 LAB — HEPARIN LEVEL (UNFRACTIONATED)
Heparin Unfractionated: 0.25 IU/mL — ABNORMAL LOW (ref 0.30–0.70)
Heparin Unfractionated: 2.08 IU/mL — ABNORMAL HIGH (ref 0.30–0.70)

## 2020-11-01 LAB — GLUCOSE, CAPILLARY: Glucose-Capillary: 196 mg/dL — ABNORMAL HIGH (ref 70–99)

## 2020-11-01 LAB — SURGICAL PCR SCREEN
MRSA, PCR: NEGATIVE
Staphylococcus aureus: NEGATIVE

## 2020-11-01 LAB — FIBRINOGEN: Fibrinogen: 707 mg/dL — ABNORMAL HIGH (ref 210–475)

## 2020-11-01 SURGERY — INTRA OPERATIVE ARTERIOGRAM
Anesthesia: General | Laterality: Right

## 2020-11-01 SURGERY — BYPASS GRAFT FEMORAL-POPLITEAL ARTERY
Anesthesia: General | Site: Leg Upper | Laterality: Right

## 2020-11-01 MED ORDER — PHENYLEPHRINE HCL-NACL 10-0.9 MG/250ML-% IV SOLN
INTRAVENOUS | Status: DC | PRN
  Administered 2020-11-01: 75 ug/min via INTRAVENOUS

## 2020-11-01 MED ORDER — SUCCINYLCHOLINE CHLORIDE 200 MG/10ML IV SOSY
PREFILLED_SYRINGE | INTRAVENOUS | Status: DC | PRN
  Administered 2020-11-01: 80 mg via INTRAVENOUS

## 2020-11-01 MED ORDER — GLYCOPYRROLATE 0.2 MG/ML IJ SOLN
INTRAMUSCULAR | Status: DC | PRN
  Administered 2020-11-01: .2 mg via INTRAVENOUS

## 2020-11-01 MED ORDER — SODIUM CHLORIDE 0.9% FLUSH: 3.0000 mL | INTRAVENOUS | Status: DC | PRN

## 2020-11-01 MED ORDER — DEXAMETHASONE SODIUM PHOSPHATE 10 MG/ML IJ SOLN
INTRAMUSCULAR | Status: DC | PRN
  Administered 2020-11-01: 10 mg via INTRAVENOUS

## 2020-11-01 MED ORDER — FENTANYL CITRATE (PF) 100 MCG/2ML IJ SOLN
25.0000 ug | INTRAMUSCULAR | Status: DC | PRN
  Administered 2020-11-01 (×2): 50 ug via INTRAVENOUS

## 2020-11-01 MED ORDER — SUCCINYLCHOLINE CHLORIDE 200 MG/10ML IV SOSY
PREFILLED_SYRINGE | INTRAVENOUS | Status: AC
  Filled 2020-11-01: qty 10

## 2020-11-01 MED ORDER — LABETALOL HCL 5 MG/ML IV SOLN
INTRAVENOUS | Status: AC
  Filled 2020-11-01: qty 4

## 2020-11-01 MED ORDER — ROCURONIUM BROMIDE 10 MG/ML (PF) SYRINGE
PREFILLED_SYRINGE | INTRAVENOUS | Status: DC | PRN
  Administered 2020-11-01: 50 mg via INTRAVENOUS
  Administered 2020-11-01 – 2020-11-02 (×3): 20 mg via INTRAVENOUS
  Administered 2020-11-02: 10 mg via INTRAVENOUS

## 2020-11-01 MED ORDER — ROCURONIUM BROMIDE 10 MG/ML (PF) SYRINGE
PREFILLED_SYRINGE | INTRAVENOUS | Status: DC | PRN
  Administered 2020-11-01: 50 mg via INTRAVENOUS

## 2020-11-01 MED ORDER — LIDOCAINE 2% (20 MG/ML) 5 ML SYRINGE
INTRAMUSCULAR | Status: DC | PRN
  Administered 2020-11-01: 60 mg via INTRAVENOUS

## 2020-11-01 MED ORDER — ROCURONIUM BROMIDE 10 MG/ML (PF) SYRINGE
PREFILLED_SYRINGE | INTRAVENOUS | Status: AC
  Filled 2020-11-01: qty 10

## 2020-11-01 MED ORDER — LACTATED RINGERS IV SOLN: INTRAVENOUS | Status: DC | PRN

## 2020-11-01 MED ORDER — EPHEDRINE SULFATE-NACL 50-0.9 MG/10ML-% IV SOSY
PREFILLED_SYRINGE | INTRAVENOUS | Status: DC | PRN
  Administered 2020-11-01 – 2020-11-02 (×4): 5 mg via INTRAVENOUS

## 2020-11-01 MED ORDER — FENTANYL CITRATE (PF) 250 MCG/5ML IJ SOLN
INTRAMUSCULAR | Status: DC | PRN
  Administered 2020-11-01 (×2): 75 ug via INTRAVENOUS

## 2020-11-01 MED ORDER — BOOST / RESOURCE BREEZE PO LIQD CUSTOM
1.0000 | Freq: Three times a day (TID) | ORAL | Status: DC
  Administered 2020-11-02: 1 via ORAL

## 2020-11-01 MED ORDER — SODIUM CHLORIDE 0.9 % IV SOLN: INTRAVENOUS | Status: DC | PRN

## 2020-11-01 MED ORDER — SUGAMMADEX SODIUM 200 MG/2ML IV SOLN
INTRAVENOUS | Status: DC | PRN
  Administered 2020-11-01: 200 mg via INTRAVENOUS

## 2020-11-01 MED ORDER — HEPARIN SODIUM (PORCINE) 1000 UNIT/ML IJ SOLN
INTRAMUSCULAR | Status: DC | PRN
  Administered 2020-11-01 (×2): 6000 [IU] via INTRAVENOUS

## 2020-11-01 MED ORDER — ACETAMINOPHEN 500 MG PO TABS
1000.0000 mg | ORAL_TABLET | Freq: Once | ORAL | Status: AC
  Administered 2020-11-01: 1000 mg via ORAL

## 2020-11-01 MED ORDER — SODIUM CHLORIDE 0.9 % IV SOLN
INTRAVENOUS | Status: DC | PRN
  Administered 2020-11-01: 500 mL

## 2020-11-01 MED ORDER — ACETAMINOPHEN 500 MG PO TABS
ORAL_TABLET | ORAL | Status: AC
  Filled 2020-11-01: qty 2

## 2020-11-01 MED ORDER — METOPROLOL TARTRATE 12.5 MG HALF TABLET
12.5000 mg | ORAL_TABLET | Freq: Once | ORAL | Status: AC
  Administered 2020-11-01: 12.5 mg via ORAL

## 2020-11-01 MED ORDER — FENTANYL CITRATE (PF) 250 MCG/5ML IJ SOLN
INTRAMUSCULAR | Status: AC
  Filled 2020-11-01: qty 5

## 2020-11-01 MED ORDER — SODIUM CHLORIDE 0.9 % IV SOLN: 250.0000 mL | INTRAVENOUS | Status: DC | PRN

## 2020-11-01 MED ORDER — ONDANSETRON HCL 4 MG/2ML IJ SOLN
INTRAMUSCULAR | Status: DC | PRN
  Administered 2020-11-01: 4 mg via INTRAVENOUS

## 2020-11-01 MED ORDER — 0.9 % SODIUM CHLORIDE (POUR BTL) OPTIME
TOPICAL | Status: DC | PRN
  Administered 2020-11-01 (×2): 1000 mL

## 2020-11-01 MED ORDER — SODIUM CHLORIDE 0.9 % IV SOLN
INTRAVENOUS | Status: AC
  Filled 2020-11-01: qty 1.2

## 2020-11-01 MED ORDER — PROPOFOL 10 MG/ML IV BOLUS
INTRAVENOUS | Status: DC | PRN
  Administered 2020-11-01: 120 mg via INTRAVENOUS

## 2020-11-01 MED ORDER — MUPIROCIN 2 % EX OINT: 1.0000 "application " | TOPICAL_OINTMENT | Freq: Two times a day (BID) | CUTANEOUS | Status: DC

## 2020-11-01 MED ORDER — LIDOCAINE 2% (20 MG/ML) 5 ML SYRINGE
INTRAMUSCULAR | Status: AC
  Filled 2020-11-01: qty 5

## 2020-11-01 MED ORDER — METOPROLOL TARTRATE 12.5 MG HALF TABLET
ORAL_TABLET | ORAL | Status: AC
  Administered 2020-11-02: 12.5 mg via ORAL
  Filled 2020-11-01: qty 1

## 2020-11-01 MED ORDER — PROTAMINE SULFATE 10 MG/ML IV SOLN
INTRAVENOUS | Status: AC
  Filled 2020-11-01: qty 5

## 2020-11-01 MED ORDER — 0.9 % SODIUM CHLORIDE (POUR BTL) OPTIME
TOPICAL | Status: DC | PRN
  Administered 2020-11-01: 2000 mL

## 2020-11-01 MED ORDER — MORPHINE SULFATE (PF) 4 MG/ML IV SOLN
5.0000 mg | INTRAVENOUS | Status: DC | PRN
  Administered 2020-11-01 – 2020-11-02 (×3): 5 mg via INTRAVENOUS
  Filled 2020-11-01 (×3): qty 2

## 2020-11-01 MED ORDER — DEXAMETHASONE SODIUM PHOSPHATE 10 MG/ML IJ SOLN
INTRAMUSCULAR | Status: DC | PRN
  Administered 2020-11-01: 8 mg via INTRAVENOUS

## 2020-11-01 MED ORDER — FENTANYL CITRATE (PF) 250 MCG/5ML IJ SOLN
INTRAMUSCULAR | Status: DC | PRN
  Administered 2020-11-01 – 2020-11-02 (×5): 50 ug via INTRAVENOUS

## 2020-11-01 MED ORDER — LIDOCAINE 2% (20 MG/ML) 5 ML SYRINGE
INTRAMUSCULAR | Status: DC | PRN
  Administered 2020-11-01: 40 mg via INTRAVENOUS
  Administered 2020-11-01: 60 mg via INTRAVENOUS

## 2020-11-01 MED ORDER — LABETALOL HCL 5 MG/ML IV SOLN
5.0000 mg | Freq: Once | INTRAVENOUS | Status: AC
  Administered 2020-11-01: 5 mg via INTRAVENOUS

## 2020-11-01 MED ORDER — HEPARIN SODIUM (PORCINE) 1000 UNIT/ML IJ SOLN
INTRAMUSCULAR | Status: AC
  Filled 2020-11-01: qty 1

## 2020-11-01 MED ORDER — EPHEDRINE 5 MG/ML INJ
INTRAVENOUS | Status: AC
  Filled 2020-11-01: qty 10

## 2020-11-01 MED ORDER — HEPARIN (PORCINE) 25000 UT/250ML-% IV SOLN
750.0000 [IU]/h | INTRAVENOUS | Status: DC
  Administered 2020-11-02 – 2020-11-03 (×2): 800 [IU]/h via INTRAVENOUS
  Filled 2020-11-01 (×4): qty 250

## 2020-11-01 MED ORDER — CHLORHEXIDINE GLUCONATE 0.12 % MT SOLN
OROMUCOSAL | Status: AC
  Administered 2020-11-01: 15 mL
  Filled 2020-11-01: qty 15

## 2020-11-01 MED ORDER — PROPOFOL 10 MG/ML IV BOLUS
INTRAVENOUS | Status: DC | PRN
  Administered 2020-11-01: 80 mg via INTRAVENOUS

## 2020-11-01 MED ORDER — SODIUM CHLORIDE 0.9 % IV SOLN
0.2500 mg/h | INTRAVENOUS | Status: DC
  Filled 2020-11-01: qty 10

## 2020-11-01 MED ORDER — SODIUM CHLORIDE 0.9 % IV SOLN
1.0000 mg/h | INTRAVENOUS | Status: DC
  Filled 2020-11-01 (×2): qty 10

## 2020-11-01 MED ORDER — EPHEDRINE SULFATE-NACL 50-0.9 MG/10ML-% IV SOSY
PREFILLED_SYRINGE | INTRAVENOUS | Status: DC | PRN
  Administered 2020-11-01: 5 mg via INTRAVENOUS
  Administered 2020-11-01: 2.5 mg via INTRAVENOUS

## 2020-11-01 MED ORDER — HEPARIN (PORCINE) 25000 UT/250ML-% IV SOLN
800.0000 [IU]/h | INTRAVENOUS | Status: DC
  Filled 2020-11-01: qty 250

## 2020-11-01 MED ORDER — PHENYLEPHRINE 40 MCG/ML (10ML) SYRINGE FOR IV PUSH (FOR BLOOD PRESSURE SUPPORT)
PREFILLED_SYRINGE | INTRAVENOUS | Status: DC | PRN
  Administered 2020-11-01 (×4): 80 ug via INTRAVENOUS

## 2020-11-01 MED ORDER — CHLORHEXIDINE GLUCONATE CLOTH 2 % EX PADS
6.0000 | MEDICATED_PAD | Freq: Every day | CUTANEOUS | Status: DC
  Administered 2020-11-01 – 2020-11-07 (×5): 6 via TOPICAL

## 2020-11-01 MED ORDER — SODIUM CHLORIDE 0.9% FLUSH: 3.0000 mL | Freq: Two times a day (BID) | INTRAVENOUS | Status: DC

## 2020-11-01 MED ORDER — HEMOSTATIC AGENTS (NO CHARGE) OPTIME
TOPICAL | Status: DC | PRN
  Administered 2020-11-01: 1 via TOPICAL

## 2020-11-01 MED ORDER — FENTANYL CITRATE (PF) 100 MCG/2ML IJ SOLN
INTRAMUSCULAR | Status: AC
  Filled 2020-11-01: qty 2

## 2020-11-01 MED ORDER — CEFAZOLIN SODIUM-DEXTROSE 2-3 GM-%(50ML) IV SOLR
INTRAVENOUS | Status: DC | PRN
  Administered 2020-11-01 – 2020-11-02 (×2): 2 g via INTRAVENOUS

## 2020-11-01 MED ORDER — PHENYLEPHRINE HCL-NACL 10-0.9 MG/250ML-% IV SOLN
INTRAVENOUS | Status: DC | PRN
  Administered 2020-11-01: 25 ug/min via INTRAVENOUS

## 2020-11-01 MED ORDER — IODIXANOL 320 MG/ML IV SOLN
INTRAVENOUS | Status: DC | PRN
  Administered 2020-11-01: 70 mL

## 2020-11-01 MED ORDER — PROPOFOL 10 MG/ML IV BOLUS
INTRAVENOUS | Status: AC
  Filled 2020-11-01: qty 20

## 2020-11-01 MED ORDER — FENTANYL CITRATE (PF) 100 MCG/2ML IJ SOLN
INTRAMUSCULAR | Status: AC
  Administered 2020-11-01: 50 ug via INTRAVENOUS
  Filled 2020-11-01: qty 2

## 2020-11-01 MED ORDER — PHENYLEPHRINE 40 MCG/ML (10ML) SYRINGE FOR IV PUSH (FOR BLOOD PRESSURE SUPPORT)
PREFILLED_SYRINGE | INTRAVENOUS | Status: AC
  Filled 2020-11-01: qty 10

## 2020-11-01 SURGICAL SUPPLY — 84 items
BAG BANDED W/RUBBER/TAPE 36X54 (MISCELLANEOUS) IMPLANT
BANDAGE ESMARK 6X9 LF (GAUZE/BANDAGES/DRESSINGS) IMPLANT
BNDG ESMARK 6X9 LF (GAUZE/BANDAGES/DRESSINGS)
CANISTER SUCT 3000ML PPV (MISCELLANEOUS) ×2 IMPLANT
CANNULA VESSEL 3MM 2 BLNT TIP (CANNULA) ×4 IMPLANT
CATH ANGIO 5F BER2 100CM (CATHETERS) ×2 IMPLANT
CATH ANGIO 5F BER2 65CM (CATHETERS) IMPLANT
CATH BEACON 5 .035 65 KMP TIP (CATHETERS) ×2 IMPLANT
CATH INFUS 135CMX50CM (CATHETERS) ×2 IMPLANT
CHLORAPREP W/TINT 26 (MISCELLANEOUS) ×2 IMPLANT
CLIP VESOCCLUDE MED 24/CT (CLIP) ×2 IMPLANT
CLIP VESOCCLUDE SM WIDE 24/CT (CLIP) ×2 IMPLANT
COVER DOME SNAP 22 D (MISCELLANEOUS) ×4 IMPLANT
COVER PROBE W GEL 5X96 (DRAPES) ×2 IMPLANT
COVER WAND RF STERILE (DRAPES) ×2 IMPLANT
CUFF TOURN SGL QUICK 24 (TOURNIQUET CUFF)
CUFF TOURN SGL QUICK 34 (TOURNIQUET CUFF)
CUFF TOURN SGL QUICK 42 (TOURNIQUET CUFF) IMPLANT
CUFF TRNQT CYL 24X4X16.5-23 (TOURNIQUET CUFF) IMPLANT
CUFF TRNQT CYL 34X4.125X (TOURNIQUET CUFF) IMPLANT
DERMABOND ADVANCED (GAUZE/BANDAGES/DRESSINGS) ×2
DERMABOND ADVANCED .7 DNX12 (GAUZE/BANDAGES/DRESSINGS) ×2 IMPLANT
DEVICE TORQUE KENDALL .025-038 (MISCELLANEOUS) ×2 IMPLANT
DRAIN HEMOVAC 1/8 X 5 (WOUND CARE) IMPLANT
DRAPE FEMORAL ANGIO 80X135IN (DRAPES) ×2 IMPLANT
DRAPE HALF SHEET 40X57 (DRAPES) IMPLANT
DRAPE INCISE IOBAN 66X45 STRL (DRAPES) ×2 IMPLANT
DRAPE X-RAY CASS 24X20 (DRAPES) IMPLANT
DRSG TEGADERM 4X4.75 (GAUZE/BANDAGES/DRESSINGS) ×2 IMPLANT
ELECT REM PT RETURN 9FT ADLT (ELECTROSURGICAL) ×2
ELECTRODE REM PT RTRN 9FT ADLT (ELECTROSURGICAL) ×1 IMPLANT
EVACUATOR SILICONE 100CC (DRAIN) IMPLANT
GAUZE 4X4 16PLY RFD (DISPOSABLE) ×2 IMPLANT
GLOVE BIO SURGEON STRL SZ7.5 (GLOVE) ×2 IMPLANT
GOWN STRL REUS W/ TWL LRG LVL3 (GOWN DISPOSABLE) ×3 IMPLANT
GOWN STRL REUS W/TWL LRG LVL3 (GOWN DISPOSABLE) ×6
GUIDEWIRE ANGLED .035X150CM (WIRE) ×2 IMPLANT
GUIDEWIRE BENTSON (WIRE) IMPLANT
HEMOSTAT SPONGE AVITENE ULTRA (HEMOSTASIS) IMPLANT
KIT BASIN OR (CUSTOM PROCEDURE TRAY) ×2 IMPLANT
KIT MICROPUNCTURE NIT STIFF (SHEATH) ×4 IMPLANT
KIT TURNOVER KIT B (KITS) ×2 IMPLANT
NEEDLE PERC 18GX7CM (NEEDLE) ×2 IMPLANT
NS IRRIG 1000ML POUR BTL (IV SOLUTION) ×4 IMPLANT
PACK PERIPHERAL VASCULAR (CUSTOM PROCEDURE TRAY) ×2 IMPLANT
PACK SURGICAL SETUP 50X90 (CUSTOM PROCEDURE TRAY) ×2 IMPLANT
PAD ARMBOARD 7.5X6 YLW CONV (MISCELLANEOUS) ×4 IMPLANT
PROTECTION STATION PRESSURIZED (MISCELLANEOUS)
SET COLLECT BLD 21X3/4 12 (NEEDLE) IMPLANT
SET MICROPUNCTURE 5F STIFF (MISCELLANEOUS) IMPLANT
SHEATH AVANTI 11CM 5FR (SHEATH) IMPLANT
SHEATH PINNACLE 6F 10CM (SHEATH) ×2 IMPLANT
SHEATH PINNACLE R/O II 6F 4CM (SHEATH) ×4 IMPLANT
SPONGE GAUZE 2X2 8PLY STRL LF (GAUZE/BANDAGES/DRESSINGS) ×2 IMPLANT
STATION PROTECTION PRESSURIZED (MISCELLANEOUS) IMPLANT
STOPCOCK 4 WAY LG BORE MALE ST (IV SETS) ×2 IMPLANT
STOPCOCK MORSE 400PSI 3WAY (MISCELLANEOUS) ×4 IMPLANT
STRIP CLOSURE SKIN 1/2X4 (GAUZE/BANDAGES/DRESSINGS) ×2 IMPLANT
SUT PROLENE 5 0 C 1 24 (SUTURE) IMPLANT
SUT PROLENE 6 0 CC (SUTURE) ×2 IMPLANT
SUT PROLENE 7 0 BV 1 (SUTURE) IMPLANT
SUT PROLENE 7 0 BV1 MDA (SUTURE) IMPLANT
SUT SILK 2 0 SH (SUTURE) ×6 IMPLANT
SUT SILK 3 0 (SUTURE)
SUT SILK 3-0 18XBRD TIE 12 (SUTURE) IMPLANT
SUT VIC AB 2-0 SH 27 (SUTURE) ×2
SUT VIC AB 2-0 SH 27XBRD (SUTURE) ×1 IMPLANT
SUT VIC AB 3-0 SH 27 (SUTURE)
SUT VIC AB 3-0 SH 27X BRD (SUTURE) IMPLANT
SUT VIC AB 4-0 PS2 27 (SUTURE) ×2 IMPLANT
SYR 10ML LL (SYRINGE) ×6 IMPLANT
SYR 20ML LL LF (SYRINGE) ×2 IMPLANT
SYR MEDRAD MARK V 150ML (SYRINGE) IMPLANT
TAPE UMBILICAL COTTON 1/8X30 (MISCELLANEOUS) ×2 IMPLANT
TOWEL GREEN STERILE (TOWEL DISPOSABLE) ×4 IMPLANT
TRAY FOLEY MTR SLVR 16FR STAT (SET/KITS/TRAYS/PACK) ×2 IMPLANT
TUBING EXTENTION W/L.L. (IV SETS) ×2 IMPLANT
TUBING HIGH PRESSURE 120CM (CONNECTOR) IMPLANT
TUBING INJECTOR 48 (MISCELLANEOUS) ×2 IMPLANT
UNDERPAD 30X36 HEAVY ABSORB (UNDERPADS AND DIAPERS) ×2 IMPLANT
WATER STERILE IRR 1000ML POUR (IV SOLUTION) ×2 IMPLANT
WIRE BENTSON .035X145CM (WIRE) ×2 IMPLANT
WIRE G V18X300CM (WIRE) ×2 IMPLANT
WIRE TORQFLEX AUST .018X40CM (WIRE) ×2 IMPLANT

## 2020-11-01 SURGICAL SUPPLY — 71 items
ADH SKN CLS APL DERMABOND .7 (GAUZE/BANDAGES/DRESSINGS) ×2
AGENT HMST SPONGE THK3/8 (HEMOSTASIS) ×1
BANDAGE ESMARK 6X9 LF (GAUZE/BANDAGES/DRESSINGS) IMPLANT
BNDG CMPR 9X6 STRL LF SNTH (GAUZE/BANDAGES/DRESSINGS)
BNDG ESMARK 6X9 LF (GAUZE/BANDAGES/DRESSINGS)
CANISTER SUCT 3000ML PPV (MISCELLANEOUS) ×3 IMPLANT
CANNULA VESSEL 3MM 2 BLNT TIP (CANNULA) ×6 IMPLANT
CATH EMB 3FR 80CM (CATHETERS) ×3 IMPLANT
CATH EMB 4FR 80CM (CATHETERS) ×3 IMPLANT
CATH EMB LATEX FREE 3FRX80CM (CATHETERS) ×3
CATH EMB LATEX FREE 4FRX80CM (CATHETERS) ×3
CATH EMB LF 3FRX80 (CATHETERS) ×1 IMPLANT
CATH EMB LF 4FRX80 (CATHETERS) ×1 IMPLANT
CLIP VESOCCLUDE MED 24/CT (CLIP) ×3 IMPLANT
CLIP VESOCCLUDE SM WIDE 24/CT (CLIP) ×3 IMPLANT
COVER SURGICAL LIGHT HANDLE (MISCELLANEOUS) ×3 IMPLANT
COVER WAND RF STERILE (DRAPES) ×3 IMPLANT
CUFF TOURN SGL QUICK 24 (TOURNIQUET CUFF)
CUFF TOURN SGL QUICK 34 (TOURNIQUET CUFF)
CUFF TOURN SGL QUICK 42 (TOURNIQUET CUFF) IMPLANT
CUFF TRNQT CYL 24X4X16.5-23 (TOURNIQUET CUFF) IMPLANT
CUFF TRNQT CYL 34X4.125X (TOURNIQUET CUFF) IMPLANT
DERMABOND ADVANCED (GAUZE/BANDAGES/DRESSINGS) ×4
DERMABOND ADVANCED .7 DNX12 (GAUZE/BANDAGES/DRESSINGS) ×2 IMPLANT
DRAIN HEMOVAC 1/8 X 5 (WOUND CARE) ×3 IMPLANT
DRAPE HALF SHEET 40X57 (DRAPES) IMPLANT
DRAPE X-RAY CASS 24X20 (DRAPES) IMPLANT
ELECT REM PT RETURN 9FT ADLT (ELECTROSURGICAL) ×3
ELECTRODE REM PT RTRN 9FT ADLT (ELECTROSURGICAL) ×1 IMPLANT
EVACUATOR SILICONE 100CC (DRAIN) ×3 IMPLANT
GAUZE SPONGE 2X2 8PLY STRL LF (GAUZE/BANDAGES/DRESSINGS) ×1 IMPLANT
GAUZE SPONGE 4X4 16PLY XRAY LF (GAUZE/BANDAGES/DRESSINGS) ×3 IMPLANT
GLOVE BIO SURGEON STRL SZ7.5 (GLOVE) ×3 IMPLANT
GLOVE SURG UNDER POLY LF SZ7.5 (GLOVE) ×12 IMPLANT
GOWN STRL REUS W/ TWL LRG LVL3 (GOWN DISPOSABLE) ×3 IMPLANT
GOWN STRL REUS W/TWL LRG LVL3 (GOWN DISPOSABLE) ×9
GRAFT GORETEX STND 6X20 (Vascular Products) ×3 IMPLANT
GRAFT GORETEXSTD 6X20 (Vascular Products) ×1 IMPLANT
HEMOSTAT SPONGE AVITENE ULTRA (HEMOSTASIS) ×3 IMPLANT
KIT BASIN OR (CUSTOM PROCEDURE TRAY) ×3 IMPLANT
KIT TURNOVER KIT B (KITS) ×3 IMPLANT
LOOP VESSEL MAXI BLUE (MISCELLANEOUS) ×3 IMPLANT
LOOP VESSEL MINI RED (MISCELLANEOUS) ×3 IMPLANT
NS IRRIG 1000ML POUR BTL (IV SOLUTION) ×6 IMPLANT
PACK PERIPHERAL VASCULAR (CUSTOM PROCEDURE TRAY) ×3 IMPLANT
PAD ARMBOARD 7.5X6 YLW CONV (MISCELLANEOUS) ×6 IMPLANT
SET COLLECT BLD 21X3/4 12 (NEEDLE) IMPLANT
SPONGE GAUZE 2X2 STER 10/PKG (GAUZE/BANDAGES/DRESSINGS) ×2
STOPCOCK 4 WAY LG BORE MALE ST (IV SETS) IMPLANT
SUT ETHILON 3 0 FSL (SUTURE) ×6 IMPLANT
SUT PROLENE 5 0 C 1 24 (SUTURE) ×24 IMPLANT
SUT PROLENE 6 0 CC (SUTURE) ×15 IMPLANT
SUT PROLENE 7 0 BV 1 (SUTURE) IMPLANT
SUT PROLENE 7 0 BV1 MDA (SUTURE) ×3 IMPLANT
SUT SILK 2 0 SH (SUTURE) ×3 IMPLANT
SUT SILK 3 0 (SUTURE)
SUT SILK 3-0 18XBRD TIE 12 (SUTURE) IMPLANT
SUT VIC AB 2-0 SH 27 (SUTURE) ×6
SUT VIC AB 2-0 SH 27XBRD (SUTURE) ×2 IMPLANT
SUT VIC AB 3-0 SH 27 (SUTURE) ×15
SUT VIC AB 3-0 SH 27X BRD (SUTURE) ×5 IMPLANT
SUT VIC AB 4-0 PS2 27 (SUTURE) ×6 IMPLANT
SYR 3ML LL SCALE MARK (SYRINGE) ×6 IMPLANT
SYR BULB IRRIG 60ML STRL (SYRINGE) ×3 IMPLANT
TAPE PAPER 2X10 WHT MICROPORE (GAUZE/BANDAGES/DRESSINGS) ×3 IMPLANT
TAPE UMBILICAL COTTON 1/8X30 (MISCELLANEOUS) ×3 IMPLANT
TOWEL GREEN STERILE (TOWEL DISPOSABLE) ×3 IMPLANT
TRAY FOLEY MTR SLVR 16FR STAT (SET/KITS/TRAYS/PACK) ×3 IMPLANT
TUBING EXTENTION W/L.L. (IV SETS) IMPLANT
UNDERPAD 30X36 HEAVY ABSORB (UNDERPADS AND DIAPERS) ×3 IMPLANT
WATER STERILE IRR 1000ML POUR (IV SOLUTION) ×3 IMPLANT

## 2020-11-01 NOTE — Progress Notes (Signed)
Patient in PACU complaining of pain in right foot.  Foot is cool and dusky in appearance.  She has no peroneal and dorsalis pedis or posterior tibial Doppler signal.  Infusion catheter is in place and appears to be working.  We will see if foot begins to improve in a few hours with thrombolytic therapy.  Otherwise may need exploration in the operating room later this evening.  Continue heparin 800 units/h through side-port of sheath  Continue TPA 1 mg/h through infusion wire  Patient continues to remain very high risk for limb loss  Patient's sister contacted and updated.  Fabienne Bruns, MD [facility] [place] Office: [phone]

## 2020-11-01 NOTE — Progress Notes (Signed)
**Note De-Identified Mercedes [name] Obfuscation** ANTICOAGULATION CONSULT NOTE - Follow Up Consult  Pharmacy Consult for Heparin Indication: Critical limb ischemia s/p intervention   Labs: Recent Labs    10/29/20 0640 10/30/20 0040 10/30/20 0256 10/30/20 0731 10/31/20 0255 10/31/20 1100 11/01/20 0118  HGB 13.9  --  11.5*  --  10.8*  --  10.3*  HCT 41.0  --  36.7  --  32.9*  --  32.0*  PLT  --   --  354  --  377  --  364  APTT  --  36  --   --   --   --   --   HEPARINUNFRC  --  <0.10*  --    < > 0.25* 0.39 0.25*  CREATININE 0.70  --  0.71  --   --   --   --    < > = values in this interval not displayed.    Assessment: 73 YOF s/p R leg ischemia with occluded femoropopliteal bypass s/p abdom aortogram with RLE runoff, and R common iliac stent on 3/4. Heparin restarted post procedure. On Eliquis PTA (Last dose 3/2).   Heparin level 0.25 units/ml Hg 10.3, PTLC 364  Goal of Therapy:  Heparin level 0.3-0.7 units/ml  Monitor platelets by anticoagulation protocol: Yes   Plan:  Increase Heparin to 1100 units/hr F/u post op  Thanks for allowing pharmacy to be a part of this patient's care.  Talbert Cage, PharmD Clinical Pharmacist

## 2020-11-01 NOTE — Transfer of Care (Signed)
Immediate Anesthesia Transfer of Care Note  Patient: MIKYLAH ACKROYD  Procedure(s) Performed: ANTEGRADE RIGHT LOWER EXTREMITY INTRA OPERATIVE ARTERIOGRAM (Right ) POSSIBLE THROMBOLYSIS VERSUS OPEN THROMBECTOMY AND REVSION OF RIGHT FEMORAL-POPLITEAL ARTERY BYPASS (Right )  Patient Location: PACU  Anesthesia Type:General  Level of Consciousness: awake, alert  and oriented  Airway & Oxygen Therapy: Patient Spontanous Breathing  Post-op Assessment: Report given to RN and Post -op Vital signs reviewed and stable  Post vital signs: Reviewed and stable  Last Vitals:  Vitals Value Taken Time  BP 177/102 11/01/20 1730  Temp    Pulse    Resp 12 11/01/20 1733  SpO2 100   Vitals shown include unvalidated device data.  Last Pain:  Vitals:   11/01/20 0848  TempSrc: Oral  PainSc:       Patients Stated Pain Goal: 3 (10/29/20 0630)  Complications: No complications documented.

## 2020-11-01 NOTE — Progress Notes (Addendum)
ANTICOAGULATION CONSULT NOTE - Follow Up Consult  Pharmacy Consult for Heparin Indication: Critical limb ischemia s/p intervention   Labs: Recent Labs     0000 10/30/20 0040 10/30/20 0256 10/30/20 0731 10/31/20 0255 10/31/20 1100 11/01/20 0118  HGB   < >  --  11.5*  --  10.8*  --  10.3*  HCT  --   --  36.7  --  32.9*  --  32.0*  PLT  --   --  354  --  377  --  364  APTT  --  36  --   --   --   --   --   HEPARINUNFRC  --  <0.10*  --    < > 0.25* 0.39 0.25*  CREATININE  --   --  0.71  --   --   --   --    < > = values in this interval not displayed.    Assessment: 49 YOF s/p R leg ischemia with occluded femoropopliteal bypass s/p abdom aortogram with RLE runoff, and R common iliac stent on 3/4. Heparin restarted post procedure. On Eliquis PTA (Last dose 3/2).   S/p placement of thrombolysis infusion wire - now on heparin 800 units/hr and alteplase at 1 mg/hr. Hgb 10.3, plt 364. No s/sx of bleeding or infusion issues.   Goal of Therapy:  Heparin level 0.2-0.5 units/ml  Monitor platelets by anticoagulation protocol: Yes   Plan:  Continue heparin 800 units/hr while on alteplase Will order heparin level/fibrinogen 6 hr  Daily Heparin level, CBC  Sherron Monday, PharmD, BCCCP Clinical Pharmacist  Phone: [phone] 11/01/2020 7:36 PM  Please check AMION for all [facility] phone numbers After 10:00 PM, call Main Pharmacy [phone]

## 2020-11-01 NOTE — Plan of Care (Signed)

## 2020-11-01 NOTE — Anesthesia Procedure Notes (Signed)
Procedure Name: Intubation Date/Time: 11/01/2020 2:15 PM Performed by: Marena Chancy, CRNA Pre-anesthesia Checklist: Patient identified, Emergency Drugs available, Suction available and Patient being monitored Patient Re-evaluated:Patient Re-evaluated prior to induction Oxygen Delivery Method: Circle System Utilized Preoxygenation: Pre-oxygenation with 100% oxygen Induction Type: IV induction Ventilation: Mask ventilation without difficulty Laryngoscope Size: Miller and 2 Grade View: Grade I Tube type: Oral Tube size: 7.0 mm Number of attempts: 1 Airway Equipment and Method: Stylet and Oral airway Placement Confirmation: ETT inserted through vocal cords under direct vision,  positive ETCO2 and breath sounds checked- equal and bilateral Tube secured with: Tape Dental Injury: Teeth and Oropharynx as per pre-operative assessment

## 2020-11-01 NOTE — Interval H&P Note (Signed)
History and Physical Interval Note:  11/01/2020 1:57 PM  Mercedes [name]  has presented today for surgery, with the diagnosis of PERIPHERAL ARTERY DISEASE.  The various methods of treatment have been discussed with the patient and family. After consideration of risks, benefits and other options for treatment, the patient has consented to  Procedure(s): ANTEGRADE RIGHT LOWER EXTREMITY INTRA OPERATIVE ARTERIOGRAM (Right) POSSIBLE THROMBOLYSIS VERSUS OPEN THROMBECTOMY AND REVSION OF RIGHT FEMORAL-POPLITEAL ARTERY BYPASS (Right) as a surgical intervention.  The patient's history has been reviewed, patient examined, no change in status, stable for surgery.  I have reviewed the patient's chart and labs.  Questions were answered to the patient's satisfaction.     Fabienne Bruns

## 2020-11-01 NOTE — Op Note (Signed)
Procedure: Ultrasound right common femoral artery, antegrade puncture right common femoral artery, right lower extremity arteriogram, insertion of thrombolysis infusion wire right peroneal artery  Preoperative diagnosis: Ischemia right foot  Postoperative diagnosis: Same  Anesthesia: General  Assistant: Clotilde Dieter, MD for assistance with manipulation of wires and catheters and expediting procedure  Operative findings: 1.  Occluded right femoral to below-knee popliteal bypass crossed with infusion wire with distal tip of catheter in the proximal peroneal artery  2.  Small piece of thrombus versus plaque proximal profunda femoris artery mobile  Operative details: After obtaining form consent, the patient was taken to the operating room.  The patient was placed in supine position operating table.  After induction general anesthesia and endotracheal ovation, ultrasound was used to identify the right common femoral artery and a pre-existing femoral-popliteal bypass.  A micropuncture needle was used to catheterize the right common femoral artery and the micropuncture wire advanced into the artery and confirmed under fluoroscopy the tip of the wire was in the profunda.  I then placed the micropuncture sheath over the wire into the common femoral artery.  I then advanced an 035 J-wire into the distal common femoral artery and placed a short 6 French sheath over this.  Through this sheath I placed an 035 Glidewire and attempted to catheterize the femoropopliteal bypass.  The wire preferentially Going down the profunda.  Therefore I pulled the sheath back slightly to give more running room.  During this portion of the case the sheath became dislodged from the right common femoral artery.  There was no active hematoma formation.  So at this point an additional micropuncture was made on the anteromedial surface of the common femoral artery and a wire advanced into what appeared to be the femoropopliteal bypass.   The micropuncture sheath was placed over this.  An 1 Bentson wire was then placed through this.  The micropuncture sheath was then swapped out for the 6 French short sheath.  The tip of the sheath was in the right common femoral artery.  A 5 [place] KMP catheter was then used to direct the Bentson wire into the femoropopliteal bypass.  This was confirmed with contrast angiogram.  A 5 [place] KMP catheter was then advanced over the guidewire into the femoropopliteal bypass and we attempted to advance an 035 Glidewire across the anastomosis but it would not go.  The V 18 wire was then used to cross the anastomosis and confirmed that we were in the distal peroneal artery which is the patient's primary runoff vessel.  A 135 cm length infusion wire was then placed with the tip of the sideholes just across the anastomosis and the proximal peroneal artery and the proximal sideholes in the distal common femoral artery.  Contrast angiogram did show a small amount of debris in the proximal profunda.  It was thought that most likely this was a piece of thrombus.  It was mobile.  Possibly this is also an area of intimal injury.  The artery was still having flow antegrade.  This will be evaluated further on her follow-up film tomorrow.  TPA was started at 1 mg/h through the infusion wire.  Heparin was started at 800 units/h through the side-port of the 6 French sheath.  The patient tolerated procedure well and there were no complications.  The instrument sponge needle count was correct in the case.  The patient was taken the recovery room in stable condition.  The patient will be taken for follow-up film to  the PV lab tomorrow.  Fabienne Bruns, MD [facility] [place] Office: [phone]

## 2020-11-01 NOTE — Anesthesia Procedure Notes (Signed)
Procedure Name: Intubation Date/Time: 11/01/2020 8:52 PM Performed by: Zollie Scale, CRNA Pre-anesthesia Checklist: Patient identified, Emergency Drugs available, Suction available and Patient being monitored Patient Re-evaluated:Patient Re-evaluated prior to induction Oxygen Delivery Method: Circle System Utilized Preoxygenation: Pre-oxygenation with 100% oxygen Induction Type: IV induction, Rapid sequence and Cricoid Pressure applied Laryngoscope Size: Miller and 2 Grade View: Grade II Tube type: Oral Tube size: 7.0 mm Number of attempts: 2 Airway Equipment and Method: Stylet and Oral airway Placement Confirmation: ETT inserted through vocal cords under direct vision,  positive ETCO2 and breath sounds checked- equal and bilateral Secured at: 22 cm Tube secured with: Tape Dental Injury: Teeth and Oropharynx as per pre-operative assessment

## 2020-11-01 NOTE — Anesthesia Preprocedure Evaluation (Addendum)
Anesthesia Evaluation  Patient identified by MRN, date of birth, ID band Patient awake    Reviewed: Allergy & Precautions, H&P , NPO status , Patient's Chart, lab work & pertinent test results, reviewed documented beta blocker date and time   Airway Mallampati: II  TM Distance: >3 FB Neck ROM: Full    Dental no notable dental hx. (+) Edentulous Upper, Dental Advisory Given   Pulmonary former smoker,    Pulmonary exam normal breath sounds clear to auscultation       Cardiovascular hypertension, Pt. on medications and Pt. on home beta blockers + CAD, + Past MI, + Cardiac Stents and + Peripheral Vascular Disease  + dysrhythmias Atrial Fibrillation + pacemaker  Rhythm:Regular Rate:Normal     Neuro/Psych  Headaches, Seizures -,  PSYCHIATRIC DISORDERS Anxiety Depression  Neuromuscular disease    GI/Hepatic Neg liver ROS, GERD  ,  Endo/Other  diabetesHyperthyroidism SLE  Renal/GU negative Renal ROS  negative genitourinary   Musculoskeletal  (+) Arthritis , Osteoarthritis and Rheumatoid disorders,    Abdominal   Peds  Hematology  (+) Blood dyscrasia, anemia ,   Anesthesia Other Findings   Reproductive/Obstetrics negative OB ROS                             Anesthesia Physical  Anesthesia Plan  ASA: III and emergent  Anesthesia Plan: General   Post-op Pain Management:    Induction: Intravenous  PONV Risk Score and Plan: 4 or greater and Ondansetron, Dexamethasone and Midazolam  Airway Management Planned: Oral ETT  Additional Equipment: Arterial line  Intra-op Plan:   Post-operative Plan: Extubation in OR  Informed Consent: I have reviewed the patients History and Physical, chart, labs and discussed the procedure including the risks, benefits and alternatives for the proposed anesthesia with the patient or authorized representative who has indicated his/her understanding and acceptance.      Dental advisory given  Plan Discussed with: CRNA  Anesthesia Plan Comments:        Anesthesia Quick Evaluation

## 2020-11-01 NOTE — Anesthesia Procedure Notes (Signed)
Arterial Line Insertion Start/End3/02/2021 10:51 PM, 11/01/2020 11:01 PM Performed by: Cecile Hearing, MD, anesthesiologist  Patient location: OR. Preanesthetic checklist: patient identified, IV checked, site marked, risks and benefits discussed, surgical consent, monitors and equipment checked, pre-op evaluation, timeout performed and anesthesia consent Lidocaine 1% used for infiltration Left, radial was placed Catheter size: 20 Fr Hand hygiene performed  and maximum sterile barriers used   Attempts: 2 Procedure performed using ultrasound guided technique. Ultrasound Notes:anatomy identified, needle tip was noted to be adjacent to the nerve/plexus identified, no ultrasound evidence of intravascular and/or intraneural injection and image(s) printed for medical record Following insertion, dressing applied and Biopatch. Post procedure assessment: normal and unchanged  Patient tolerated the procedure well with no immediate complications. Additional procedure comments: Attempt x2 by CRNA, attempt x2 by MDA.[name]

## 2020-11-01 NOTE — Anesthesia Preprocedure Evaluation (Addendum)
Anesthesia Evaluation  Patient identified by MRN, date of birth, ID band Patient awake    Reviewed: Allergy & Precautions, H&P , NPO status , Patient's Chart, lab work & pertinent test results, reviewed documented beta blocker date and time   Airway Mallampati: II  TM Distance: >3 FB Neck ROM: Full    Dental no notable dental hx. (+) Edentulous Upper, Dental Advisory Given   Pulmonary neg pulmonary ROS, former smoker,    Pulmonary exam normal breath sounds clear to auscultation       Cardiovascular hypertension, Pt. on medications and Pt. on home beta blockers + CAD, + Past MI, + Cardiac Stents and + Peripheral Vascular Disease  + pacemaker  Rhythm:Regular Rate:Normal     Neuro/Psych  Headaches, Seizures -,  Anxiety Depression    GI/Hepatic Neg liver ROS, GERD  ,  Endo/Other  Hyperthyroidism   Renal/GU negative Renal ROS  negative genitourinary   Musculoskeletal  (+) Arthritis , Osteoarthritis,    Abdominal   Peds  Hematology  (+) Blood dyscrasia, anemia ,   Anesthesia Other Findings   Reproductive/Obstetrics negative OB ROS                            Anesthesia Physical Anesthesia Plan  ASA: III  Anesthesia Plan: General   Post-op Pain Management:    Induction: Intravenous  PONV Risk Score and Plan: 4 or greater and Ondansetron, Dexamethasone and Midazolam  Airway Management Planned: Oral ETT  Additional Equipment:   Intra-op Plan:   Post-operative Plan: Extubation in OR  Informed Consent: I have reviewed the patients History and Physical, chart, labs and discussed the procedure including the risks, benefits and alternatives for the proposed anesthesia with the patient or authorized representative who has indicated his/her understanding and acceptance.     Dental advisory given  Plan Discussed with: CRNA  Anesthesia Plan Comments:         Anesthesia Quick  Evaluation

## 2020-11-01 NOTE — Progress Notes (Signed)
Pt complaining of worsening pain in calf and foot. Still no doppler signals in foot.  She has a femoral pulse.  No hematoma around sheath.  Right foot profoundly ischemic and high risk of limb loss. D/w pt will go to OR emergently for thrombectomy.  I believe this is her best chance at limb salvage at this point.  Risks benefits complications including but not limited to bleeding infecion limb loss discussed with pt who agrees to proceed.  Fabienne Bruns, MD [facility] [place] Office: [phone]

## 2020-11-02 ENCOUNTER — Encounter (HOSPITAL_COMMUNITY)
Admission: RE | Disposition: A | Discharge status: Home Health | Payer: Self-pay | Source: Home / Self Care | Attending: Vascular Surgery

## 2020-11-02 ENCOUNTER — Encounter (HOSPITAL_COMMUNITY): Payer: Self-pay | Admitting: Vascular Surgery

## 2020-11-02 DIAGNOSIS — I998 Other disorder of circulatory system: Secondary | ICD-10-CM

## 2020-11-02 LAB — POCT I-STAT 7, (LYTES, BLD GAS, ICA,H+H)
Acid-Base Excess: 0 mmol/L (ref 0.0–2.0)
Acid-base deficit: 1 mmol/L (ref 0.0–2.0)
Acid-base deficit: 1 mmol/L (ref 0.0–2.0)
Acid-base deficit: 3 mmol/L — ABNORMAL HIGH (ref 0.0–2.0)
Bicarbonate: 20.7 mmol/L (ref 20.0–28.0)
Bicarbonate: 22.3 mmol/L (ref 20.0–28.0)
Bicarbonate: 23.1 mmol/L (ref 20.0–28.0)
Bicarbonate: 23.3 mmol/L (ref 20.0–28.0)
Calcium, Ion: 1.14 mmol/L — ABNORMAL LOW (ref 1.15–1.40)
Calcium, Ion: 1.15 mmol/L (ref 1.15–1.40)
Calcium, Ion: 1.16 mmol/L (ref 1.15–1.40)
Calcium, Ion: 1.18 mmol/L (ref 1.15–1.40)
HCT: 24 % — ABNORMAL LOW (ref 36.0–46.0)
HCT: 25 % — ABNORMAL LOW (ref 36.0–46.0)
HCT: 26 % — ABNORMAL LOW (ref 36.0–46.0)
HCT: 29 % — ABNORMAL LOW (ref 36.0–46.0)
Hemoglobin: 8.2 g/dL — ABNORMAL LOW (ref 12.0–15.0)
Hemoglobin: 8.5 g/dL — ABNORMAL LOW (ref 12.0–15.0)
Hemoglobin: 8.8 g/dL — ABNORMAL LOW (ref 12.0–15.0)
Hemoglobin: 9.9 g/dL — ABNORMAL LOW (ref 12.0–15.0)
O2 Saturation: 100 %
O2 Saturation: 100 %
O2 Saturation: 100 %
O2 Saturation: 100 %
Patient temperature: 35.6
Patient temperature: 36.6
Patient temperature: 36.6
Patient temperature: 36.7
Potassium: 3.7 mmol/L (ref 3.5–5.1)
Potassium: 3.9 mmol/L (ref 3.5–5.1)
Potassium: 4 mmol/L (ref 3.5–5.1)
Potassium: 4.1 mmol/L (ref 3.5–5.1)
Sodium: 136 mmol/L (ref 135–145)
Sodium: 137 mmol/L (ref 135–145)
Sodium: 138 mmol/L (ref 135–145)
Sodium: 138 mmol/L (ref 135–145)
TCO2: 22 mmol/L (ref 22–32)
TCO2: 23 mmol/L (ref 22–32)
TCO2: 24 mmol/L (ref 22–32)
TCO2: 24 mmol/L (ref 22–32)
pCO2 arterial: 29.9 mmHg — ABNORMAL LOW (ref 32.0–48.0)
pCO2 arterial: 30.7 mmHg — ABNORMAL LOW (ref 32.0–48.0)
pCO2 arterial: 32.1 mmHg (ref 32.0–48.0)
pCO2 arterial: 34.8 mmHg (ref 32.0–48.0)
pH, Arterial: 7.432 (ref 7.350–7.450)
pH, Arterial: 7.434 (ref 7.350–7.450)
pH, Arterial: 7.448 (ref 7.350–7.450)
pH, Arterial: 7.49 — ABNORMAL HIGH (ref 7.350–7.450)
pO2, Arterial: 194 mmHg — ABNORMAL HIGH (ref 83.0–108.0)
pO2, Arterial: 205 mmHg — ABNORMAL HIGH (ref 83.0–108.0)
pO2, Arterial: 214 mmHg — ABNORMAL HIGH (ref 83.0–108.0)
pO2, Arterial: 227 mmHg — ABNORMAL HIGH (ref 83.0–108.0)

## 2020-11-02 LAB — CBC
HCT: 27.8 % — ABNORMAL LOW (ref 36.0–46.0)
HCT: 26.9 % — ABNORMAL LOW (ref 36.0–46.0)
Hemoglobin: 8.6 g/dL — ABNORMAL LOW (ref 12.0–15.0)
Hemoglobin: 8.3 g/dL — ABNORMAL LOW (ref 12.0–15.0)
MCH: 24.4 pg — ABNORMAL LOW (ref 26.0–34.0)
MCH: 24.3 pg — ABNORMAL LOW (ref 26.0–34.0)
MCHC: 30.9 g/dL (ref 30.0–36.0)
MCHC: 30.9 g/dL (ref 30.0–36.0)
MCV: 78.8 fL — ABNORMAL LOW (ref 80.0–100.0)
MCV: 78.7 fL — ABNORMAL LOW (ref 80.0–100.0)
Platelets: 321 10*3/uL (ref 150–400)
Platelets: 308 10*3/uL (ref 150–400)
RBC: 3.53 MIL/uL — ABNORMAL LOW (ref 3.87–5.11)
RBC: 3.42 MIL/uL — ABNORMAL LOW (ref 3.87–5.11)
RDW: 17.1 % — ABNORMAL HIGH (ref 11.5–15.5)
RDW: 17.2 % — ABNORMAL HIGH (ref 11.5–15.5)
WBC: 13.1 10*3/uL — ABNORMAL HIGH (ref 4.0–10.5)
WBC: 11.3 10*3/uL — ABNORMAL HIGH (ref 4.0–10.5)
nRBC: 0 % (ref 0.0–0.2)
nRBC: 0 % (ref 0.0–0.2)

## 2020-11-02 LAB — POCT I-STAT, CHEM 8
BUN: 9 mg/dL (ref 8–23)
Calcium, Ion: 1.2 mmol/L (ref 1.15–1.40)
Chloride: 103 mmol/L (ref 98–111)
Creatinine, Ser: 0.6 mg/dL (ref 0.44–1.00)
Glucose, Bld: 172 mg/dL — ABNORMAL HIGH (ref 70–99)
HCT: 34 % — ABNORMAL LOW (ref 36.0–46.0)
Hemoglobin: 11.6 g/dL — ABNORMAL LOW (ref 12.0–15.0)
Potassium: 4.3 mmol/L (ref 3.5–5.1)
Sodium: 135 mmol/L (ref 135–145)
TCO2: 22 mmol/L (ref 22–32)

## 2020-11-02 LAB — BASIC METABOLIC PANEL
Anion gap: 10 (ref 5–15)
BUN: 9 mg/dL (ref 8–23)
CO2: 19 mmol/L — ABNORMAL LOW (ref 22–32)
Calcium: 8.5 mg/dL — ABNORMAL LOW (ref 8.9–10.3)
Chloride: 105 mmol/L (ref 98–111)
Creatinine, Ser: 0.72 mg/dL (ref 0.44–1.00)
GFR, Estimated: >60 mL/min (ref >60)
Glucose, Bld: 206 mg/dL — ABNORMAL HIGH (ref 70–99)
Potassium: 3.9 mmol/L (ref 3.5–5.1)
Sodium: 134 mmol/L — ABNORMAL LOW (ref 135–145)

## 2020-11-02 LAB — POCT ACTIVATED CLOTTING TIME
Activated Clotting Time: 303 seconds
Activated Clotting Time: 154 seconds

## 2020-11-02 LAB — LIPID PANEL
Cholesterol: 113 mg/dL (ref 0–200)
HDL: 40 mg/dL — ABNORMAL LOW (ref >40)
LDL Cholesterol: 61 mg/dL (ref 0–99)
Total CHOL/HDL Ratio: 2.8 RATIO
Triglycerides: 58 mg/dL (ref <150)
VLDL: 12 mg/dL (ref 0–40)

## 2020-11-02 LAB — HEPARIN LEVEL (UNFRACTIONATED): Heparin Unfractionated: 0.33 IU/mL (ref 0.30–0.70)

## 2020-11-02 SURGERY — LOWER EXTREMITY ANGIOGRAPHY
Anesthesia: LOCAL

## 2020-11-02 MED ORDER — LABETALOL HCL 5 MG/ML IV SOLN: 10.0000 mg | INTRAVENOUS | Status: DC | PRN

## 2020-11-02 MED ORDER — NITROGLYCERIN 0.4 MG SL SUBL: 0.4000 mg | SUBLINGUAL_TABLET | SUBLINGUAL | Status: DC | PRN

## 2020-11-02 MED ORDER — ASPIRIN EC 81 MG PO TBEC
81.0000 mg | DELAYED_RELEASE_TABLET | Freq: Every day | ORAL | Status: DC
  Administered 2020-11-03 – 2020-11-08 (×6): 81 mg via ORAL
  Filled 2020-11-02 (×6): qty 1

## 2020-11-02 MED ORDER — PHENOL 1.4 % MT LIQD: 1.0000 | OROMUCOSAL | Status: DC | PRN

## 2020-11-02 MED ORDER — ALUM & MAG HYDROXIDE-SIMETH 200-200-20 MG/5ML PO SUSP: 15.0000 mL | ORAL | Status: DC | PRN

## 2020-11-02 MED ORDER — OXYCODONE-ACETAMINOPHEN 5-325 MG PO TABS
1.0000 | ORAL_TABLET | ORAL | Status: DC | PRN
  Administered 2020-11-02 – 2020-11-04 (×9): 2 via ORAL
  Administered 2020-11-05: 1 via ORAL
  Administered 2020-11-06: 2 via ORAL
  Administered 2020-11-06: 1 via ORAL
  Administered 2020-11-07 – 2020-11-08 (×5): 2 via ORAL
  Filled 2020-11-02 (×7): qty 2
  Filled 2020-11-02: qty 1
  Filled 2020-11-02 (×9): qty 2

## 2020-11-02 MED ORDER — FLEET ENEMA 7-19 GM/118ML RE ENEM
1.0000 | ENEMA | Freq: Once | RECTAL | Status: DC | PRN
  Filled 2020-11-02: qty 1

## 2020-11-02 MED ORDER — PANTOPRAZOLE SODIUM 40 MG PO TBEC
40.0000 mg | DELAYED_RELEASE_TABLET | Freq: Every day | ORAL | Status: DC
  Administered 2020-11-02 – 2020-11-08 (×7): 40 mg via ORAL
  Filled 2020-11-02 (×7): qty 1

## 2020-11-02 MED ORDER — ONDANSETRON HCL 4 MG/2ML IJ SOLN
INTRAMUSCULAR | Status: AC
  Filled 2020-11-02: qty 2

## 2020-11-02 MED ORDER — HEPARIN SODIUM (PORCINE) 1000 UNIT/ML IJ SOLN
INTRAMUSCULAR | Status: AC
  Filled 2020-11-02: qty 1

## 2020-11-02 MED ORDER — GUAIFENESIN-DM 100-10 MG/5ML PO SYRP: 15.0000 mL | ORAL_SOLUTION | ORAL | Status: DC | PRN

## 2020-11-02 MED ORDER — MAGNESIUM SULFATE 2 GM/50ML IV SOLN: 2.0000 g | Freq: Every day | INTRAVENOUS | Status: DC | PRN

## 2020-11-02 MED ORDER — PROTAMINE SULFATE 10 MG/ML IV SOLN
INTRAVENOUS | Status: AC
  Filled 2020-11-02: qty 25

## 2020-11-02 MED ORDER — SENNOSIDES-DOCUSATE SODIUM 8.6-50 MG PO TABS
1.0000 | ORAL_TABLET | Freq: Every evening | ORAL | Status: DC | PRN
  Filled 2020-11-02 (×2): qty 1

## 2020-11-02 MED ORDER — ACETAMINOPHEN 325 MG RE SUPP
325.0000 mg | RECTAL | Status: DC | PRN
Start: 2020-11-02 — End: 2020-11-08
  Filled 2020-11-02: qty 2

## 2020-11-02 MED ORDER — ALBUMIN HUMAN 5 % IV SOLN: INTRAVENOUS | Status: DC | PRN

## 2020-11-02 MED ORDER — SUGAMMADEX SODIUM 200 MG/2ML IV SOLN
INTRAVENOUS | Status: DC | PRN
  Administered 2020-11-02: 125 mg via INTRAVENOUS

## 2020-11-02 MED ORDER — PROTAMINE SULFATE 10 MG/ML IV SOLN
INTRAVENOUS | Status: DC | PRN
  Administered 2020-11-02: 20 mg via INTRAVENOUS
  Administered 2020-11-02: 30 mg via INTRAVENOUS
  Administered 2020-11-02 (×2): 10 mg via INTRAVENOUS
  Administered 2020-11-02: 30 mg via INTRAVENOUS

## 2020-11-02 MED ORDER — METOPROLOL TARTRATE 12.5 MG HALF TABLET
12.5000 mg | ORAL_TABLET | Freq: Two times a day (BID) | ORAL | Status: DC
Start: 2020-11-02 — End: 2020-11-08
  Administered 2020-11-02 – 2020-11-08 (×12): 12.5 mg via ORAL
  Filled 2020-11-02 (×13): qty 1

## 2020-11-02 MED ORDER — ROSUVASTATIN CALCIUM 20 MG PO TABS
40.0000 mg | ORAL_TABLET | Freq: Every day | ORAL | Status: DC
  Administered 2020-11-02 – 2020-11-07 (×6): 40 mg via ORAL
  Filled 2020-11-02 (×6): qty 2

## 2020-11-02 MED ORDER — BISACODYL 5 MG PO TBEC
5.0000 mg | DELAYED_RELEASE_TABLET | Freq: Every day | ORAL | Status: DC | PRN
  Administered 2020-11-04: 5 mg via ORAL
  Filled 2020-11-02 (×2): qty 1

## 2020-11-02 MED ORDER — SODIUM CHLORIDE 0.9 % IV SOLN: 500.0000 mL | Freq: Once | INTRAVENOUS | Status: DC | PRN

## 2020-11-02 MED ORDER — DOCUSATE SODIUM 100 MG PO CAPS
100.0000 mg | ORAL_CAPSULE | Freq: Every day | ORAL | Status: DC
  Administered 2020-11-03 – 2020-11-08 (×6): 100 mg via ORAL
  Filled 2020-11-02 (×6): qty 1

## 2020-11-02 MED ORDER — ONDANSETRON HCL 4 MG/2ML IJ SOLN
INTRAMUSCULAR | Status: DC | PRN
  Administered 2020-11-02: 4 mg via INTRAVENOUS

## 2020-11-02 MED ORDER — ACETAMINOPHEN 325 MG PO TABS
325.0000 mg | ORAL_TABLET | ORAL | Status: DC | PRN
  Administered 2020-11-05: 650 mg via ORAL
  Filled 2020-11-02: qty 2

## 2020-11-02 MED ORDER — ROCURONIUM BROMIDE 10 MG/ML (PF) SYRINGE
PREFILLED_SYRINGE | INTRAVENOUS | Status: AC
  Filled 2020-11-02: qty 10

## 2020-11-02 MED ORDER — ENSURE ENLIVE PO LIQD
237.0000 mL | Freq: Two times a day (BID) | ORAL | Status: DC
  Administered 2020-11-02 – 2020-11-07 (×10): 237 mL via ORAL

## 2020-11-02 MED ORDER — ENSURE MAX PROTEIN PO LIQD
8.0000 [oz_av] | Freq: Every day | ORAL | Status: DC
  Administered 2020-11-02: 8 [oz_av] via ORAL
  Filled 2020-11-02: qty 330

## 2020-11-02 MED ORDER — HEMOSTATIC AGENTS (NO CHARGE) OPTIME
TOPICAL | Status: DC | PRN
  Administered 2020-11-02: 1 via TOPICAL

## 2020-11-02 MED ORDER — PROTAMINE SULFATE 10 MG/ML IV SOLN
INTRAVENOUS | Status: AC
  Filled 2020-11-02: qty 5

## 2020-11-02 MED ORDER — ESMOLOL HCL 100 MG/10ML IV SOLN
INTRAVENOUS | Status: DC | PRN
  Administered 2020-11-02 (×2): 20 mg via INTRAVENOUS

## 2020-11-02 MED ORDER — HEPARIN SODIUM (PORCINE) 1000 UNIT/ML IJ SOLN
INTRAMUSCULAR | Status: DC | PRN
  Administered 2020-11-02 (×2): 6000 [IU] via INTRAVENOUS

## 2020-11-02 MED ORDER — CEFAZOLIN SODIUM-DEXTROSE 2-4 GM/100ML-% IV SOLN
2.0000 g | Freq: Three times a day (TID) | INTRAVENOUS | Status: AC
Start: 2020-11-02 — End: 2020-11-02
  Administered 2020-11-02 (×2): 2 g via INTRAVENOUS
  Filled 2020-11-02 (×2): qty 100

## 2020-11-02 MED ORDER — ADULT MULTIVITAMIN W/MINERALS CH
1.0000 | ORAL_TABLET | Freq: Every day | ORAL | Status: DC
  Administered 2020-11-02 – 2020-11-08 (×7): 1 via ORAL
  Filled 2020-11-02 (×7): qty 1

## 2020-11-02 MED ORDER — POTASSIUM CHLORIDE CRYS ER 20 MEQ PO TBCR: 20.0000 meq | EXTENDED_RELEASE_TABLET | Freq: Every day | ORAL | Status: DC | PRN

## 2020-11-02 MED ORDER — CEFAZOLIN SODIUM 1 G IJ SOLR
INTRAMUSCULAR | Status: AC
  Filled 2020-11-02: qty 20

## 2020-11-02 NOTE — Progress Notes (Signed)
Occupational Therapy Evaluation Patient Details Name: Mercedes [name] MRN: [id number] DOB: Feb 27, 1956 Today's Date: 11/02/2020    History of Present Illness 65 yo admitted 3/3 with RLE pain s/p aortogram and stent 3/4, 3/7 thrombectomy and revision of Rt fem pop BPG. 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 PT through [facility] has been working with pt 2x/wk. Pt able to laterally scoot into bed using drop arm recliner with assistance to move RLE on pillow. Mod A for LB ADL tasks. Pt states she is not to put weight through RLE, however no NWB orders in chart - MD/PA messaged for clarification. If pt is NWB RLE, she will need SNF. If pt able to pivot on RLE, will most likely be able to DC home with HHOT. Will follow acutely to maximize functional level of independence and facilitate DC to next venue of care.     Follow Up Recommendations  Home health OT;SNF (pending progress)    Equipment Recommendations  Other (comment) (drop arm BSC; wc with removable arm rests; will further assess)    Recommendations for Other Services       Precautions / Restrictions Precautions Precautions: Fall Precaution Comments: jp drain, left AKA Required Braces or Orthoses: Other Brace Other Brace: L prosthetic - has only worn it twice      Mobility Bed Mobility Overal bed mobility: Needs Assistance Bed Mobility: Sit to Supine     Supine to sit: HOB elevated Sit to supine: Min assist (to move RLE)   General bed mobility comments: HOB 30 degrees with pt able to pivot to right with increased time    Transfers Overall transfer level: Needs assistance   Transfers: Lateral/Scoot Transfers          Lateral/Scoot Transfers: Min assist (to scoot leg) General transfer comment: RLE supported; Pt states she is not suppose to put weight throughher RLE;  maintained NWB at this time    Balance Overall balance assessment: Needs assistance   Sitting balance-Leahy Scale: Good                                     ADL either performed or assessed with clinical judgement   ADL Overall ADL's : Needs assistance/impaired     Grooming: Set up;Sitting   Upper Body Bathing: Set up;Sitting   Lower Body Bathing: Minimal assistance;Sitting/lateral leans   Upper Body Dressing : Set up;Sitting   Lower Body Dressing: Moderate assistance;Sitting/lateral leans       Toileting- Clothing Manipulation and Hygiene: Moderate assistance;Sitting/lateral lean Toileting - Clothing Manipulation Details (indicate cue type and reason): foley       General ADL Comments: Completed lateral scoot transfer to bed from drop arm recliner with min A to scoot leg     Vision Baseline Vision/History: Wears glasses       Perception     Praxis      Pertinent Vitals/Pain Pain Assessment: 0-10 Pain Score: 3  Pain Location: RLE and foot Pain Descriptors / Indicators: Aching;Guarding;Tender Pain Intervention(s): Limited activity within patient's tolerance     Hand Dominance Right   Extremity/Trunk Assessment Upper Extremity Assessment Upper Extremity Assessment: Overall WFL for tasks assessed   Lower Extremity Assessment Lower Extremity Assessment: Defer to PT evaluation RLE Deficits /  Details: decreased ROM and strength due to pain, pt very hesistant to move due to soreness and unable to bear weight RLE: Unable to fully assess due to pain   Cervical / Trunk Assessment Cervical / Trunk Assessment: Normal   Communication Communication Communication: No difficulties   Cognition Arousal/Alertness: Awake/alert Behavior During Therapy: WFL for tasks assessed/performed Overall Cognitive Status: No family/caregiver present to determine baseline cognitive functioning Area of Impairment: Memory;Orientation;Following  commands;Safety/judgement;Problem solving                 Orientation Level: Time   Memory: Decreased short-term memory Following Commands: Follows one step commands consistently;Follows one step commands with increased time Safety/Judgement: Decreased awareness of deficits   Problem Solving: Slow processing General Comments: most likely baseline; appears to have her own way of doing things   General Comments       Exercises   Shoulder Instructions      Home Living Family/patient expects to be discharged to:: Private residence Living Arrangements: Alone Available Help at Discharge: Personal care attendant Type of Home: House Home Access: Ramped entrance     Home Layout: One level     Bathroom Shower/Tub: Chief Strategy Officer: Standard Bathroom Accessibility: Yes How Accessible: Accessible via wheelchair Home Equipment: Walker - 2 wheels;Cane - single point;Tub bench;Bedside commode;Hand held shower head;Adaptive equipment;Wheelchair - manual (having bars put up in the tub) Adaptive Equipment: Reacher Additional Comments: aide 2hrs /day, 7 days/wk otherwise is alone; PT coming out 2 days/wk [facility])      Prior Functioning/Environment Level of Independence: Independent with assistive device(s)        Comments: Using wheelchair more lately due to R leg pain.  Cares for self at wheelcahir level. Drives.  Able to transfer to tub seat for bathing and dressing.  No assist with meals or light home management. (pt does her own cooking/medication management; aide assists with cleaning; driving; states family/friends can assist wtih errands) Pt states she does not have removable armrests on wc        OT Problem List: Decreased safety awareness;Decreased knowledge of use of DME or AE;Impaired sensation;Pain;Impaired balance (sitting and/or standing)      OT Treatment/Interventions: Self-care/ADL training;Therapeutic exercise;DME and/or AE  instruction;Therapeutic activities;Patient/family education;Balance training    OT Goals(Current goals can be found in the care plan section) Acute Rehab OT Goals Patient Stated Goal: to get back home OT Goal Formulation: With patient Time For Goal Achievement: 11/16/20 Potential to Achieve Goals: Good  OT Frequency: Min 2X/week   Barriers to D/C:            Co-evaluation              AM-PAC OT "6 Clicks" Daily Activity     Outcome Measure Help from another person eating meals?: None Help from another person taking care of personal grooming?: A Little Help from another person toileting, which includes using toliet, bedpan, or urinal?: A Lot Help from another person bathing (including washing, rinsing, drying)?: A Lot Help from another person to put on and taking off regular upper body clothing?: A Little Help from another person to put on and taking off regular lower body clothing?: A Lot 6 Click Score: 16   End of Session Nurse Communication: Mobility status;Precautions  Activity Tolerance: Patient tolerated treatment well Patient left: in bed;with call bell/phone within reach;with bed alarm set  OT Visit Diagnosis: Other abnormalities of gait and mobility (R26.89);Muscle weakness (generalized) (M62.81);Pain Pain - Right/Left: Right Pain -  part of body: Leg                Time: 1231-1301 OT Time Calculation (min): 30 min Charges:  OT General Charges $OT Visit: 1 Visit OT Evaluation $OT Eval Moderate Complexity: 1 Mod OT Treatments $Self Care/Home Management : 8-22 mins  Luisa Dago, OT/L   Acute OT Clinical Specialist Acute Rehabilitation Services Pager [phone] Office [phone]   [facility] 11/02/2020, 1:18 PM

## 2020-11-02 NOTE — Evaluation (Signed)
Physical Therapy Evaluation Patient Details Name: Mercedes [name] MRN: [id number] DOB: [birth year] Today's Date: 11/02/2020   History of Present Illness  65 yo admitted 3/3 with RLE pain s/Mercedes [name] aortogram and stent 3/4, 3/7 thrombectomy and revision of Rt fem pop BPG. 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 Pt required increased time for all cues and conversation and unable to divide attention at this time. Pt lives alone with aide a few hours a day. If pt can return to mod I WC level then return home is appropriate but otherwise may require ST-SNF prior to return home. Pt with decreased cognition, activity tolerance, balance and strength who will benefit from acute therapy to maximize mobility, safety and function to decrease burden of care and fall risk.     Follow Up Recommendations Home health PT;SNF;Supervision for mobility/OOB (pending activity and cognitive progression)    Equipment Recommendations  None recommended by PT    Recommendations for Other Services OT consult     Precautions / Restrictions Precautions Precautions: Fall Precaution Comments: jp drain, left AKA Restrictions Weight Bearing Restrictions: No      Mobility  Bed Mobility Overal bed mobility: Needs Assistance Bed Mobility: Supine to Sit     Supine to sit: HOB elevated     General bed mobility comments: HOB 30 degrees with pt able to pivot to right with increased time    Transfers Overall transfer level: Needs assistance   Transfers: Lateral/Scoot Transfers          Lateral/Scoot Transfers: Min guard General transfer comment: with setup and assist for lines pt able to perform lateral scoot to right without physical assist, unable to tolerate weight on RLE for assist with scoot and utilizing  bil UE  Ambulation/Gait             General Gait Details: unable as no prosthesis present  Stairs            Wheelchair Mobility    Modified Rankin (Stroke Patients Only)       Balance                                             Pertinent Vitals/Pain Pain Assessment: 0-10 Pain Score: 5  Pain Location: RLE and foot Pain Descriptors / Indicators: Aching;Guarding;Tender Pain Intervention(s): Limited activity within patient's tolerance;Monitored during session;Repositioned    Home Living Family/patient expects to be discharged to:: Private residence Living Arrangements: Alone Available Help at Discharge: Personal care attendant Type of Home: House Home Access: Ramped entrance     Home Layout: One level Home Equipment: Environmental consultant - 2 wheels;Cane - single point;Tub bench;Bedside commode;Hand held shower head;Adaptive equipment;Wheelchair - manual Additional Comments: aide 2hrs /day otherwise is alone    Prior Function           Comments: Useing wheelchair more lately due to R leg pain.  Cares for self at wheelcahir level. Drives.  Able to transfer to tub seat for bathing and dressing.  No assist with meals or light home management.     Hand Dominance        Extremity/Trunk Assessment   Upper Extremity Assessment Upper Extremity Assessment: Overall WFL for tasks  assessed    Lower Extremity Assessment Lower Extremity Assessment: RLE deficits/detail RLE Deficits / Details: decreased ROM and strength due to pain, pt very hesistant to move due to soreness and unable to bear weight RLE: Unable to fully assess due to pain    Cervical / Trunk Assessment Cervical / Trunk Assessment: Normal  Communication   Communication: No difficulties  Cognition Arousal/Alertness: Awake/alert Behavior During Therapy: WFL for tasks assessed/performed Overall Cognitive Status: Impaired/Different from baseline Area of Impairment:  Memory;Orientation;Following commands;Safety/judgement;Problem solving                 Orientation Level: Time   Memory: Decreased short-term memory Following Commands: Follows one step commands consistently;Follows one step commands with increased time Safety/Judgement: Decreased awareness of deficits   Problem Solving: Slow processing General Comments: pt confused regarding date and sequence of events. pt unsure if she will have any visitors for her prosthesis but reports she just started using with HHPT. Pt slow to process and stating she is overwhelmed after sx      General Comments      Exercises General Exercises - Lower Extremity Long Arc Quad: AROM;10 reps;Right;Seated Hip Flexion/Marching: AROM;Right;Seated;10 reps   Assessment/Plan    PT Assessment Patient needs continued PT services  PT Problem List Decreased strength;Decreased mobility;Decreased safety awareness;Decreased activity tolerance;Decreased cognition;Decreased balance;Decreased knowledge of use of DME;Pain;Decreased range of motion       PT Treatment Interventions Balance training;Functional mobility training;Cognitive remediation;Therapeutic activities;Patient/family education;DME instruction;Therapeutic exercise    PT Goals (Current goals can be found in the Care Plan section)  Acute Rehab PT Goals Patient Stated Goal: return home PT Goal Formulation: With patient Time For Goal Achievement: 11/16/20 Potential to Achieve Goals: Good    Frequency Min 3X/week   Barriers to discharge Decreased caregiver support      Co-evaluation               AM-PAC PT "6 Clicks" Mobility  Outcome Measure Help needed turning from your back to your side while in a flat bed without using bedrails?: A Little Help needed moving from lying on your back to sitting on the side of a flat bed without using bedrails?: A Little Help needed moving to and from a bed to a chair (including a wheelchair)?: A  Little Help needed standing up from a chair using your arms (e.g., wheelchair or bedside chair)?: Total Help needed to walk in hospital room?: Total Help needed climbing 3-5 steps with a railing? : Total 6 Click Score: 12    End of Session   Activity Tolerance: Patient tolerated treatment well Patient left: in chair;with call bell/phone within reach;with chair alarm set Nurse Communication: Mobility status;Precautions PT Visit Diagnosis: Other abnormalities of gait and mobility (R26.89);Muscle weakness (generalized) (M62.81)    Time: 6578-4696 PT Time Calculation (min) (ACUTE ONLY): 39 min   Charges:   PT Evaluation $PT Eval Moderate Complexity: 1 Mod PT Treatments $Therapeutic Exercise: 8-22 mins $Therapeutic Activity: 8-22 mins        Mercedes [name] Mercedes [name], PT Acute Rehabilitation Services Pager: [phone] Office: [phone]   Mercedes [name] Mercedes [name] Mercedes [name] 11/02/2020, 10:08 AM

## 2020-11-02 NOTE — Progress Notes (Signed)
Voicemail left for patient's sister to update her after her operation.  Fabienne Bruns, MD [facility] [place] Office: [phone]

## 2020-11-02 NOTE — Transfer of Care (Signed)
Immediate Anesthesia Transfer of Care Note  Patient: Mercedes [name]  Procedure(s) Performed: Thrombectomy and Revision of Right Femoral to below knee Bypass with Interpostional graft to Tibial / peroneal Trunk and Endarterectomy Tibial /Peroneal Trunk. (Right Leg Upper)  Patient Location: PACU  Anesthesia Type:General  Level of Consciousness: awake, patient cooperative and responds to stimulation  Airway & Oxygen Therapy: Patient Spontanous Breathing and Patient connected to face mask oxygen  Post-op Assessment: Report given to RN and Post -op Vital signs reviewed and stable  Post vital signs: Reviewed and stable  Last Vitals:  Vitals Value Taken Time  BP 141/68 (ABP) 11/02/20 0345  Temp    Pulse 100 11/02/20 0349  Resp 15 11/02/20 0349  SpO2 100 % 11/02/20 0349  Vitals shown include unvalidated device data.  Last Pain:  Vitals:   11/01/20 2111  TempSrc:   PainSc: 10-Worst pain ever      Patients Stated Pain Goal: 3 (11/01/20 1922)  Complications: No complications documented.

## 2020-11-02 NOTE — Anesthesia Postprocedure Evaluation (Signed)
Anesthesia Post Note  Patient: Mercedes [name]  Procedure(s) Performed: Thrombectomy and Revision of Right Femoral to below knee Bypass with Interpostional graft to Tibial / peroneal Trunk and Endarterectomy Tibial /Peroneal Trunk. (Right Leg Upper)     Patient location during evaluation: PACU Anesthesia Type: General Level of consciousness: awake and alert Pain management: pain level controlled Vital Signs Assessment: post-procedure vital signs reviewed and stable Respiratory status: spontaneous breathing, nonlabored ventilation and respiratory function stable Cardiovascular status: blood pressure returned to baseline and stable Postop Assessment: no apparent nausea or vomiting Anesthetic complications: no   No complications documented.  Last Vitals:  Vitals:   11/02/20 0600 11/02/20 0615  BP: (!) 124/59 133/64  Pulse: 75 77  Resp: 15 11  Temp:    SpO2: 95% 97%    Last Pain:  Vitals:   11/02/20 0600  TempSrc:   PainSc: Asleep                 Cecile Hearing

## 2020-11-02 NOTE — Anesthesia Postprocedure Evaluation (Signed)
Anesthesia Post Note  Patient: Mercedes [name]  Procedure(s) Performed: ANTEGRADE RIGHT LOWER EXTREMITY INTRA OPERATIVE ARTERIOGRAM (Right ) POSSIBLE THROMBOLYSIS VERSUS OPEN THROMBECTOMY AND REVSION OF RIGHT FEMORAL-POPLITEAL ARTERY BYPASS (Right )     Patient location during evaluation: PACU Anesthesia Type: General Level of consciousness: awake Pain management: pain level controlled Vital Signs Assessment: post-procedure vital signs reviewed and stable Respiratory status: spontaneous breathing and respiratory function stable Cardiovascular status: stable Postop Assessment: no apparent nausea or vomiting Anesthetic complications: no   No complications documented.  Last Vitals:  Vitals:   11/02/20 0600 11/02/20 0615  BP: (!) 124/59 133/64  Pulse: 75 77  Resp: 15 11  Temp:    SpO2: 95% 97%    Last Pain:  Vitals:   11/02/20 0700  TempSrc:   PainSc: 7    Pain Goal: Patients Stated Pain Goal: 3 (11/02/20 0700)                 Mellody Dance

## 2020-11-02 NOTE — Progress Notes (Signed)
Pt received from 2H. VSS. CHG complete. Telemetry applied. JP drained charged. Call light in reach. Will continue to monitor.   Versie Starks, RN

## 2020-11-02 NOTE — Progress Notes (Addendum)
  Progress Note    11/02/2020 7:41 AM 1 Day Post-Op  Subjective:  Soreness at incision sites   Vitals:   11/02/20 0600 11/02/20 0615  BP: (!) 124/59 133/64  Pulse: 75 77  Resp: 15 11  Temp:    SpO2: 95% 97%   Physical Exam: Lungs:  Non labored Incisions:  R groin and popliteal incisions c/d/i Extremities:  Brisk peroneal signal, soft DP signal Neurologic: A&O  CBC    Component Value Date/Time   WBC 11.3 (H) 11/02/2020 0541   RBC 3.42 (L) 11/02/2020 0541   HGB 8.3 (L) 11/02/2020 0541   HGB 14.2 10/09/2018 1125   HCT 26.9 (L) 11/02/2020 0541   HCT 44.6 10/09/2018 1125   PLT 308 11/02/2020 0541   PLT 289 10/09/2018 1125   MCV 78.7 (L) 11/02/2020 0541   MCV 79 10/09/2018 1125   MCH 24.3 (L) 11/02/2020 0541   MCHC 30.9 11/02/2020 0541   RDW 17.2 (H) 11/02/2020 0541   RDW 14.8 10/09/2018 1125   LYMPHSABS 1.7 08/27/2020 0056   MONOABS 0.4 08/27/2020 0056   EOSABS 0.4 08/27/2020 0056   BASOSABS 0.0 08/27/2020 0056    BMET    Component Value Date/Time   NA 134 (L) 11/02/2020 0541   NA 135 10/09/2018 1125   K 3.9 11/02/2020 0541   CL 105 11/02/2020 0541   CO2 19 (L) 11/02/2020 0541   GLUCOSE 206 (H) 11/02/2020 0541   BUN 9 11/02/2020 0541   BUN 10 10/09/2018 1125   CREATININE 0.72 11/02/2020 0541   CREATININE 1.13 (H) 08/03/2016 1440   CALCIUM 8.5 (L) 11/02/2020 0541   GFRNONAA >60 11/02/2020 0541   GFRAA >60 11/24/2019 0620    INR    Component Value Date/Time   INR 1.0 11/18/2019 1844     Intake/Output Summary (Last 24 hours) at 11/02/2020 0741 Last data filed at 11/02/2020 0600 Gross per 24 hour  Intake 4212.54 ml  Output 2405 ml  Net 1807.54 ml     Assessment/Plan:  65 y.o. female is s/p redo R femoral to TP trunk bypass with PTFE 1 Day Post-Op   Brisk R peroneal by doppler Labs pending; continue heparin Continue JP today OOB as tolerated    Emilie Rutter, PA-C [facility] [phone] 11/02/2020 7:41 AM  Agree with  above graft currently is patent.  However, outflow is poorly compromised.  We are able to maintain graft patency over the next few days will restart her oral anticoagulation.  If graft occluded she will need an above-knee amputation.  This was discussed with the patient and her sister.    She has some blistering of the skin on the plantar aspect of her foot.  Hopefully this will heal.  Patient can transfer to 4 E.  Fabienne Bruns, MD [facility] [place] Office: [phone]

## 2020-11-02 NOTE — Progress Notes (Signed)
Initial Nutrition Assessment  DOCUMENTATION CODES:   Underweight,Non-severe (moderate) malnutrition in context of chronic illness  INTERVENTION:    Ensure Enlive po BID, each supplement provides 350 kcal and 20 grams of protein  Snacks BID  MV daily   NUTRITION DIAGNOSIS:   Severe Malnutrition related to chronic illness (PAD s/p mult amputations) as evidenced by severe fat depletion,severe muscle depletion,percent weight loss,energy intake < or equal to 75% for > or equal to 1 month.  GOAL:   Patient will meet greater than or equal to 90% of their needs  MONITOR:   PO intake,Supplement acceptance,Weight trends,Labs,I & O's,Diet advancement  REASON FOR ASSESSMENT:   Malnutrition Screening Tool    ASSESSMENT:   Patient with PMH significant for essential HTN, secondary cardiomyopathy, PVD s/p L FP, RA, SLE, CAD s/p CABG, DM, CKD II, s/p L AKA, depression, and recent admission for R toe gangrene were she underwent R great toe amputation. Presents this admission with R leg ischemia.   3/4- s/p abdominal aortogram with R lower extremity runoff, right common iliac stent  3/7- s/p antegrade puncture R common femoral artery, R lower extremity arteriogram 3/8- s/p thrombectomy, revision R fem-pop bypass, enterectomy, angioplasty  Pt discussed during ICU rounds and with RN.   Patient endorses appetite declined after leaving the hospital in December. States during this time she consumed two meals that consisted of B- eggs and bacon D- beef or chicken, vegetables, and potatoes. She consumed one Ensure daily pretty consistently. States she is struggling to increase daily Ensure intake as she does not like the taste anymore. RD discussed high protein snack/meal options. She is willing to have Ensure BID this admission and try snacks.   Patient states once she d/c from the hospital in December she weighed 93 lb and dropped down to 89 lb this admission. Records indicate patient weighed 98  lb on 10/4 and 89 lb this admission (9.2% wt loss in 5 months, significant for time frame).    UOP: 1900 ml x 24 hrs   Medications: colace Labs: Na 134 (L) CBG 172-206  NUTRITION - FOCUSED PHYSICAL EXAM:  Flowsheet Row Most Recent Value  Orbital Region Moderate depletion  Upper Arm Region Severe depletion  Thoracic and Lumbar Region Severe depletion  Buccal Region Moderate depletion  Temple Region Severe depletion  Clavicle Bone Region Severe depletion  Clavicle and Acromion Bone Region Severe depletion  Scapular Bone Region Severe depletion  Dorsal Hand Moderate depletion  Patellar Region Severe depletion  Anterior Thigh Region Severe depletion  Posterior Calf Region Severe depletion  Edema (RD Assessment) None  Hair Reviewed  Eyes Reviewed  Mouth Reviewed  Skin Reviewed  Nails Reviewed     Diet Order:   Diet Order            Diet clear liquid Room service appropriate? Yes; Fluid consistency: Thin  Diet effective now                 EDUCATION NEEDS:   Education needs have been addressed  Skin:  Skin Assessment: Reviewed RN Assessment  Last BM:  3/4  Height:   Ht Readings from Last 1 Encounters:  10/29/20 5\' 5"  (1.651 m)    Weight:   Wt Readings from Last 1 Encounters:  10/29/20 40.4 kg    BMI:  Body mass index is 14.82 kg/m.  Estimated Nutritional Needs:   Kcal:  1300-1500 kcal  Protein:  70-85 grams  Fluid:  >/= 1.3 L/day  12/29/20 RD,  LDN Clinical Nutrition Pager listed in [place]

## 2020-11-02 NOTE — Op Note (Signed)
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  The patient was placed in supine position operating table.  After induction general anesthesia and endotracheal ovation patient's entire right lower extremities prepped and draped in usual sterile fashion.  Next a longitudinal incision was made in the right groin carried down through the subcutaneous tissues down to the level of the pre-existing femoral-popliteal bypass.  There were dense adhesions around the native and femoral-popliteal bypass.  Dissection of all of this was quite tedious.  Common femoral artery was dissected free circumferentially just above the level of the anastomosis.  One circumflex iliac branch was also dissected free circumferentially and vessel loop placed around this.  There was a sheath in the common femoral artery and this was removed and the hole repaired with a 5-0 Prolene suture.  Dissection was carried down to the femoral bifurcation and the native superficial femoral and profunda femoris arteries were dissected free circumferentially and Vesseloops placed around these.  A vessel loop was also placed around the pre-existing PTFE graft for the femoropopliteal bypass.  Patient was given 6000 inch of intravenous heparin.  She was given several additional double boluses of heparin during the course  of the case to maintain an ACT greater than 250.  Common femoral artery was controlled with a vessel loop.  Profunda was also controlled with a vessel loop as well as the SFA and the femoropopliteal bypass.  Longitudinal opening was made in the hood of the pre-existing femoropopliteal bypass PTFE.  Upon opening this there was visible thrombus within the distal common femoral artery.  There was also a large plug of thrombus in the proximal profunda.  #3 and #4 Fogarty catheters were used to thrombectomize proximally and distally down the profunda.  There was excellent return of backbleeding.  There was good for bleeding.  I then passed a 4 Fogarty catheter down the femoropopliteal bypass and retrieved minimal thrombus and was unable to get the catheter passed all the way across the anastomosis.  At this point all loose debris had been removed from the femoral anastomosis.  There was some laminated thrombus adjacent to the anastomosis and this was all debrided away.  A piece of PTFE material was then sewn on as a patch angioplasty using a running 6-0 Prolene suture.  Despite completion anastomosis it was for blood backbled and thoroughly flushed.  Anastomosis was secured clamps released there is pulsatile flow in the profunda and the common femoral artery immediately.  There is also good Doppler flow in the profunda.  Hemostasis was obtained with direct pressure and Avitene.  Attention was then turned to the below-knee popliteal space.  Longitudinal incision was made on the medial aspect of the right leg carried down through subcutaneous tissues and through the fascia.  The lining popliteal space was entered.  There was also dense adhesions in this area which again was fairly tedious to dissect out.  I dissected out the femoropopliteal bypass about 3 cm above the anastomosis as well as the below-knee popliteal artery  above and below the anastomosis.  Preoperative arteriogram had shown that the patient's best runoff  vessel was her peroneal.  So I took down the large number of the soleus muscles and dissected the tibioperoneal trunk out all the way down to the takeoff of the posterior tibial and peroneal arteries.  The anterior tibial artery was dissected free circumferentially and a vessel loop placed around this.  Longitudinal opening was then made in the distal hood of the below-knee popliteal bypass.  There was a visible thrombus within this.  It was removed under direct vision.  There was some backbleeding.  However on taking a 2 dilator and try to pass the distal anastomosis it would not pass.  A 1-1/2 mm dilator were also not passed.  The vessel was very calcified.  There was minimal backbleeding.  At this point I opened the vessel longitudinally through the toe of the anastomosis and extended this arteriotomy all the way down into the tibioperoneal trunk achieve a reasonable lumen in the vessel.  There was still a large amount of calcific plaque and I did an endarterectomy in this area to provide a good working space to create an anastomosis.  The anterior tibial artery was probed and was chronically occluded.  It was ligated with a 2-0 silk tie.  The below-knee popliteal artery was ligated just above the old anastomosis with a 2-0 silk tie.  Posterior tibial artery was also chronically occluded but I left this in the circuit because it was part of our anastomosis.  The old bypass graft was transected just above the pre-existing distal anastomosis.  A new 6 mm PTFE graft brought up in the operative field and sewn end-to-end to this.  I then spatulated the distal end of the graft and laid this down onto a tongue of the tibioperoneal trunk with the distal toe of the graft extending onto the proximal peroneal artery.  This was done with a running 6-0 Prolene suture.  Despite completion anastomosis it was for blood backbled and thoroughly flushed.  Anastomosis was secured clamps released there is good Doppler flow in the  peroneal artery at the level of the ankle and the dorsalis pedis artery on the foot.  There was no posterior tibial Doppler signal.  Hemostasis was obtained with direct pressure with Avitene and Arista.  All this was thoroughly irrigated normal saline solution.  The groin was then closed in multiple layers of running 2030 by 4-0 Vicryl subcuticular stitch in the skin.  The below-knee popliteal incision had a 10 flat Jackson-Pratt drain placed in the deep space brought through a separate stab wound and sutured to the skin with a 3-0 nylon.  The wound was then closed with multiple layers of running 3-0 Vicryl suture and a 4-0 Vicryl subcuticular stitch in the skin.  Dermabond was applied to both incisions.  Patient tolerated procedure well and there were no complications.  Insert sponge and needle count was correct end of the case.  Patient was taken to recovery room stable condition.  If this bypass graft occludes I do not believe that she has further options for revision.  Fabienne Bruns, MD [facility] Office: [phone]

## 2020-11-02 NOTE — Progress Notes (Signed)
ANTICOAGULATION CONSULT NOTE - Follow Up Consult  Pharmacy Consult for Heparin Indication: Critical limb ischemia s/p intervention   Labs: Recent Labs    11/01/20 0118 11/01/20 1941 11/01/20 2233 11/01/20 2307 11/02/20 0237 11/02/20 0436 11/02/20 0541 11/02/20 1300  HGB 10.3* 11.3* 11.6*   < > 8.2* 8.6* 8.3*  --   HCT 32.0* 37.1 34.0*   < > 24.0* 27.8* 26.9*  --   PLT 364 351  --   --   --  321 308  --   HEPARINUNFRC 0.25* 2.08*  --   --   --   --   --  0.33  CREATININE  --   --  0.60  --   --   --  0.72  --    < > = values in this interval not displayed.    Assessment: 5 YOF s/p R leg ischemia with occluded femoropopliteal bypass s/p abdom aortogram with RLE runoff, and R common iliac stent on 3/4. Heparin restarted post procedure. On Eliquis PTA (Last dose 3/2).   S/p placement of thrombolysis infusion wire -  Continue heparin for next few days to ensure patency  now on heparin 800 units/hr heparin level at goal 0.33 CBC ok  No bleeding or infusion issues.   Goal of Therapy:  Heparin level 0.2-0.5 units/ml  Monitor platelets by anticoagulation protocol: Yes   Plan:  Continue heparin 800 units/hr  Daily Heparin level, CBC   Leota Sauers Pharm.D. CPP, BCPS Clinical Pharmacist [phone] 11/02/2020 3:14 PM    Please check AMION for all [facility] Pharmacy phone numbers After 10:00 PM, call Main Pharmacy [phone]

## 2020-11-03 LAB — BASIC METABOLIC PANEL
Anion gap: 7 (ref 5–15)
BUN: 9 mg/dL (ref 8–23)
CO2: 24 mmol/L (ref 22–32)
Calcium: 8.4 mg/dL — ABNORMAL LOW (ref 8.9–10.3)
Chloride: 102 mmol/L (ref 98–111)
Creatinine, Ser: 0.68 mg/dL (ref 0.44–1.00)
GFR, Estimated: >60 mL/min (ref >60)
Glucose, Bld: 103 mg/dL — ABNORMAL HIGH (ref 70–99)
Potassium: 3.8 mmol/L (ref 3.5–5.1)
Sodium: 133 mmol/L — ABNORMAL LOW (ref 135–145)

## 2020-11-03 LAB — CBC
HCT: 21.8 % — ABNORMAL LOW (ref 36.0–46.0)
Hemoglobin: 6.8 g/dL — CL (ref 12.0–15.0)
MCH: 24.4 pg — ABNORMAL LOW (ref 26.0–34.0)
MCHC: 31.2 g/dL (ref 30.0–36.0)
MCV: 78.1 fL — ABNORMAL LOW (ref 80.0–100.0)
Platelets: 284 10*3/uL (ref 150–400)
RBC: 2.79 MIL/uL — ABNORMAL LOW (ref 3.87–5.11)
RDW: 17.2 % — ABNORMAL HIGH (ref 11.5–15.5)
WBC: 10 10*3/uL (ref 4.0–10.5)
nRBC: 0 % (ref 0.0–0.2)

## 2020-11-03 LAB — HEPARIN LEVEL (UNFRACTIONATED): Heparin Unfractionated: 0.36 IU/mL (ref 0.30–0.70)

## 2020-11-03 LAB — PREPARE RBC (CROSSMATCH)

## 2020-11-03 MED ORDER — SODIUM CHLORIDE 0.9% IV SOLUTION: Freq: Once | INTRAVENOUS | Status: DC

## 2020-11-03 NOTE — Progress Notes (Signed)
Pt ordered to received 2 units PRBC this AM. Pt and MD discussed and decided to hold off on blood today and recheck hemoglobin tomorrow morning. Pt asymptomatic at this time. Will continue to monitor.  Versie Starks, RN

## 2020-11-03 NOTE — Progress Notes (Signed)
ANTICOAGULATION CONSULT NOTE - Follow Up Consult  Pharmacy Consult for Heparin Indication: Critical limb ischemia s/p intervention   Labs: Recent Labs    11/01/20 0118 11/01/20 1941 11/01/20 2233 11/01/20 2307 11/02/20 0436 11/02/20 0541 11/02/20 1300 11/03/20 0007  HGB 10.3* 11.3* 11.6*   < > 8.6* 8.3*  --  6.8*  HCT 32.0* 37.1 34.0*   < > 27.8* 26.9*  --  21.8*  PLT 364 351  --   --  321 308  --  284  HEPARINUNFRC 0.25* 2.08*  --   --   --   --  0.33  --   CREATININE  --   --  0.60  --   --  0.72  --  0.68   < > = values in this interval not displayed.    Assessment: 76 YOF s/p R leg ischemia with occluded femoropopliteal bypass s/p abdom aortogram with RLE runoff, and R common iliac stent on 3/4. Heparin restarted post procedure. On Eliquis PTA (Last dose 3/2).   S/p placement of thrombolysis infusion wire.  Plan to continue heparin for next few days to ensure patency for now on heparin 800 units/hr. Hgb trended down to 6.8 this morning, blood ordered but patient hesitant and wants to discuss with MD prior to administering this morning, plt trended down but wnl. No heparin level ordered, will obtain asap to assess.   Heparin level at low end of goal (0.36), no overt bleeding noted.   Goal of Therapy:  Heparin level 0.2-0.5 units/ml  Monitor platelets by anticoagulation protocol: Yes   Plan:  Continue heparin 800 units/hr  Daily Heparin level, CBC  Sheppard Coil PharmD., BCPS Clinical Pharmacist 11/03/2020 8:10 AM

## 2020-11-03 NOTE — Progress Notes (Addendum)
[facility]  Subjective  - foot feels ok   Objective 130/75 63 97.9 F (36.6 C) (Oral) 14 98%  Intake/Output Summary (Last 24 hours) at 11/03/2020 5176 Last data filed at 11/03/2020 0700 Gross per 24 hour  Intake 789.97 ml  Output 1270 ml  Net -480.03 ml   Incisions clean Minimal JP Right foot cool blisters healing, no pain  Assessment/Planning: Acute blood loss anemia seems asymptomatic reluctant for transfusion  Will recheck Hgb tomorrow.  Low threshold to transfuse if low BP orthostatic dyspnea or chest pain.  Risk and benefits of transfusion d/w pt.  Oob ambulate unless plantar aspect of foot too sore  D/c drain when less than 30 cc over 24 hr  Resume NOAC tomorrow if no problems today  Heart healthy diet  No plans for further revision of bypass.  Pt understands this.  Fabienne Bruns 11/03/2020 8:33 AM --  Laboratory Lab Results: Recent Labs    11/02/20 0541 11/03/20 0007  WBC 11.3* 10.0  HGB 8.3* 6.8*  HCT 26.9* 21.8*  PLT 308 284   BMET Recent Labs    11/02/20 0541 11/03/20 0007  NA 134* 133*  K 3.9 3.8  CL 105 102  CO2 19* 24  GLUCOSE 206* 103*  BUN 9 9  CREATININE 0.72 0.68  CALCIUM 8.5* 8.4*    COAG Lab Results  Component Value Date   INR 1.0 11/18/2019   INR 1.0 11/03/2019   INR 1.1 10/01/2019   No results found for: PTT

## 2020-11-03 NOTE — TOC Initial Note (Signed)
Transition of Care (TOC) - Initial/Assessment Note  Donn Pierini RN, BSN Transitions of Care Unit 4E- RN Case Manager See Treatment Team for direct phone #    Patient Details  Name: Mercedes [name] MRN: [id number] Date of Birth: March 19, 1956  Transition of Care [facility]) CM/SW Contact:    Darrold Span, RN Phone Number: 11/03/2020, 5:21 PM  Clinical Narrative:                 Spoke with pt today at bedside, pt familiar to this writer from previous admissions. Pt lives at home, has needed DME, discussed w/c which is broken and pt was referred to [employer] on last admit to see if they could fix- per pt [employer] came out to see- and since pt "owns" w/c it would be $75 charge for them to repair. Pt voiced that right now she did not have that money to have it fixed. Pt has all needed DME otherwise- shower bench, BSC, RW, cane.   Also discussed her PCS aide hours- pt currently gets 14 hr/week 2hr/day, pt states she fills she could benefit from more hours, she is trying to get approved for CAPS services and has been working with her PCP on this- however her paperwork is still pending approval.  In the meantime will see if attending here would be willing to sign for more PCS hours- paperwork has been left on shadow chart- Per [facility] provider here just needs to fill out parts 1-6 and then [facility] will fax in to [facility] for expedited review.  TOC to f/u tomorrow on this.   Have spoken with Chestine Spore and Frances Furbish about the PCS hours and confirmed the current hours and process for increasing hours.   Pt's family transport home.   Expected Discharge Plan: Home w Home Health Services Barriers to Discharge: Continued Medical Work up   Patient Goals and CMS Choice Patient states their goals for this hospitalization and ongoing recovery are:: return home CMS Medicare.gov Compare Post Acute Care list provided to:: Patient Choice offered to / list presented to : Patient  Expected Discharge Plan and  Services Expected Discharge Plan: Home w Home Health Services   Discharge Planning Services: CM Consult Post Acute Care Choice: Home Health Living arrangements for the past 2 months: Single Family Home                   DME Agency: NA                  Prior Living Arrangements/Services Living arrangements for the past 2 months: Single Family Home Lives with:: Self,Adult Children Patient language and need for interpreter reviewed:: Yes Do you feel safe going back to the place where you live?: Yes      Need for Family Participation in Patient Care: Yes (Comment) Care giver support system in place?: Yes (comment) Current home services: DME,Homehealth aide Criminal Activity/Legal Involvement Pertinent to Current Situation/Hospitalization: No - Comment as needed  Activities of Daily Living Home Assistive Devices/Equipment: Brace (specify type),Contact lenses,Eyeglasses,Hand-held shower hose,Hearing aid,Scales ADL Screening (condition at time of admission) Patient's cognitive ability adequate to safely complete daily activities?: Yes Is the patient deaf or have difficulty hearing?: No Does the patient have difficulty seeing, even when wearing glasses/contacts?: No Does the patient have difficulty concentrating, remembering, or making decisions?: No Patient able to express need for assistance with ADLs?: Yes Does the patient have difficulty dressing or bathing?: Yes Independently performs ADLs?: Yes (appropriate for developmental age) Does  the patient have difficulty walking or climbing stairs?: Yes Weakness of Legs: Right Weakness of Arms/Hands: None  Permission Sought/Granted Permission sought to share information with : Photographer granted to share info w AGENCY: [facility]        Emotional Assessment Appearance:: Appears stated age Attitude/Demeanor/Rapport: Engaged Affect (typically observed):  Appropriate,Pleasant Orientation: : Oriented to Self,Oriented to Place,Oriented to  Time,Oriented to Situation Alcohol / Substance Use: Not Applicable Psych Involvement: No (comment)  Admission diagnosis:  Ischemia of extremity [I99.8] Patient Active Problem List   Diagnosis Date Noted  . Ischemia of extremity 10/29/2020  . Depression 12/15/2019  . S/P AKA (above knee amputation), left (HCC), 10/01/19 10/20/2019  . Femoral-popliteal bypass graft occlusion, left (HCC) 09/25/2019  . Peripheral arterial disease (HCC) 09/25/2019  . Critical lower limb ischemia (HCC) 05/09/2019  . 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 Diabetes (HCC) 04/01/2017  . Hyperthyroidism 02/21/2017  . Coronary artery disease involving native heart without angina pectoris   . Hx of CABG 07/17/2016  . Paroxysmal atrial fibrillation (HCC) 07/12/2016  . NSTEMI (non-ST elevated myocardial infarction) (HCC) 07/11/2016  . History of arterial bypass of lower extremity 12/30/2015  . Phantom limb pain-Left great toe 04/29/2015  . Seizure (HCC)   . Atrial flutter with rapid ventricular response vs. SVT 10/27/2014    Class: Acute  . Atherosclerosis of native artery of left lower extremity with rest pain (HCC) 12/18/2013  . PAD (peripheral artery disease) (HCC) 12/02/2013  . SLE (systemic lupus erythematosus) (HCC)   . CAD - S/P Takotsubo MI in 2000, CFX DES Sept 2014 05/25/2013  . Acute MI, inferior wall (HCC) 05/19/2013  . 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 Tachycardia-bradycardia (HCC) 03/09/2010  . PVD- s/p Lt [facility] April 2015 10/29/2009  . Dyslipidemia 08/26/2009  . Essential hypertension 08/26/2009  . CORONARY ATHEROSCLEROSIS NATIVE CORONARY ARTERY 08/26/2009  . 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 Rheumatoid arthritis (HCC) 08/26/2009   PCP:  Pincus Sanes, MD Pharmacy:   [facility] [phone] - [name], [place] - [address] AT [facility] OF [address] & [facility] [address] [place]  [address] Phone: [phone] Fax: [phone]  [id number] - [name], [place] - [address] AT [facility] OF [address] & [address] [place] [address] Phone: [phone] Fax: [phone]     Social Determinants of Health (SDOH) Interventions    Readmission Risk Interventions Readmission Risk Prevention Plan 09/08/2020 10/06/2019 05/16/2019  Transportation Screening Complete Complete Complete  PCP or Specialist Appt within 5-7 Days Complete Complete Complete  Home Care Screening Complete Complete Complete  Medication Review (RN CM) Complete Complete Complete  Some recent data might be hidden

## 2020-11-03 NOTE — Progress Notes (Signed)
Date and time results received: 11/03/20 0215  Test: Hgb Critical Value: 6.8 (down from 8.2)  Name of Provider Notified: Dr. Edilia Bo of [facility] paged at 0221.  Orders Received? Or Actions Taken?: Orders Received - See Orders for details   Orders received for blood administration.

## 2020-11-03 NOTE — Progress Notes (Deleted)
  Progress Note    11/03/2020 8:39 AM 2 Days Post-Op  Subjective:  Has some pain; doesn't really want a transfusion right now  Afebrile HR 50's-70's NSR 100's-130's systolic 98% RA  Vitals:   11/03/20 0245 11/03/20 0828  BP: 121/75 130/75  Pulse: 70 63  Resp: 13 14  Temp: 97.9 F (36.6 C) 97.9 F (36.6 C)  SpO2: 96% 98%    Physical Exam: Cardiac:  regular Lungs:  Non labored Incisions:  Right groin and right below knee incisions look good Extremities:  Brisk right peroneal doppler signal.  Right calf and anterior compartments are soft.  Area on distal plantar aspect of right foot   CBC    Component Value Date/Time   WBC 10.0 11/03/2020 0007   RBC 2.79 (L) 11/03/2020 0007   HGB 6.8 (LL) 11/03/2020 0007   HGB 14.2 10/09/2018 1125   HCT 21.8 (L) 11/03/2020 0007   HCT 44.6 10/09/2018 1125   PLT 284 11/03/2020 0007   PLT 289 10/09/2018 1125   MCV 78.1 (L) 11/03/2020 0007   MCV 79 10/09/2018 1125   MCH 24.4 (L) 11/03/2020 0007   MCHC 31.2 11/03/2020 0007   RDW 17.2 (H) 11/03/2020 0007   RDW 14.8 10/09/2018 1125   LYMPHSABS 1.7 08/27/2020 0056   MONOABS 0.4 08/27/2020 0056   EOSABS 0.4 08/27/2020 0056   BASOSABS 0.0 08/27/2020 0056    BMET    Component Value Date/Time   NA 133 (L) 11/03/2020 0007   NA 135 10/09/2018 1125   K 3.8 11/03/2020 0007   CL 102 11/03/2020 0007   CO2 24 11/03/2020 0007   GLUCOSE 103 (H) 11/03/2020 0007   BUN 9 11/03/2020 0007   BUN 10 10/09/2018 1125   CREATININE 0.68 11/03/2020 0007   CREATININE 1.13 (H) 08/03/2016 1440   CALCIUM 8.4 (L) 11/03/2020 0007   GFRNONAA >60 11/03/2020 0007   GFRAA >60 11/24/2019 0620    INR    Component Value Date/Time   INR 1.0 11/18/2019 1844     Intake/Output Summary (Last 24 hours) at 11/03/2020 0839 Last data filed at 11/03/2020 0836 Gross per 24 hour  Intake 1149.97 ml  Output 1270 ml  Net -120.03 ml     Assessment:  65 y.o. female is s/p:  redo R femoral to TP trunk bypass with  PTFE 1  2 Days Post-Op  Plan: -acute blood loss anemia-pt asymptomatic currently.  Will check CBC tomorrow and possibly give blood then if needed. -brisk doppler signal right peroneal.  Area of concern on plantar aspect of foot.  No weight bearing today.   -DVT prophylaxis:  Continue heparin for now   Doreatha Massed, PA-C [facility] [phone] 11/03/2020 8:39 AM

## 2020-11-03 NOTE — Progress Notes (Signed)
When pt was approached about the on-call physician's order to administer blood, pt was very apprehensive stating she had never received it before. This RN reviewed the consent with pt and answered all questions but pt endorsed she would like to wait until the morning and discuss with a family member via phone prior to infusion.VS assessed and WDL and pulses unchanged from prior assessment. No overt bleeding noted beyond pt's JP drainage (104mL since start of shift).  The on-call provider, Dr. Edilia Bo, was called and notified of the above @0256 . No additional orders received; will continue to monitor.

## 2020-11-03 NOTE — Progress Notes (Signed)
PHARMACIST LIPID MONITORING   Mercedes [name] is a 65 y.o. female admitted on 10/29/2020 with critical limb ischemia.  Pharmacy has been consulted to optimize lipid-lowering therapy with the indication of secondary prevention for clinical ASCVD.  Recent Labs:  Lipid Panel (last 6 months):   Lab Results  Component Value Date   CHOL 113 11/02/2020   TRIG 58 11/02/2020   HDL 40 (L) 11/02/2020   CHOLHDL 2.8 11/02/2020   VLDL 12 11/02/2020   LDLCALC 61 11/02/2020    Hepatic function panel (last 6 months):   Lab Results  Component Value Date   AST 20 09/01/2020   ALT 15 09/01/2020   ALKPHOS 74 09/01/2020   BILITOT 0.5 09/01/2020    SCr (since admission):   Serum creatinine: 0.68 mg/dL 59/29/24 4628 Estimated creatinine clearance: 44.1 mL/min  Current therapy and lipid therapy tolerance Current lipid-lowering therapy: rosuvastatin 40mg  Previous lipid-lowering therapies (if applicable): none Documented or reported allergies or intolerances to lipid-lowering therapies (if applicable): none  Assessment:   Discussed antihyperlipidemic agents with patient. Patient states that she was only taking two medications prior to admit (metoprolol and apixaban). Her LD is within goal at 36 and would agree with starting/continuing rosuvastatin at discharge. I informed her that she should be on a cholesterol medication and expect this at discharge. I also discussed with her that zetia is on her profile but she is completely unfamiliar with the medication. Given her current LDL and the information she wasn't taking a statin prior to admit I did not add zetia back on to her profile.   Plan:    1.Statin intensity (high intensity recommended for all patients regardless of the LDL):  No statin changes. The patient is already on a high intensity statin.  2.Add ezetimibe (if any one of the following):   Not indicated at this time.  3.Refer to lipid clinic:   No  4.Follow-up with:  Primary care  provider - 75, MD  5.Follow-up labs after discharge:  No changes in lipid therapy, repeat a lipid panel in one year.     Pincus Sanes PharmD., BCPS Clinical Pharmacist 11/03/2020 12:21 PM

## 2020-11-04 DIAGNOSIS — E440 Moderate protein-calorie malnutrition: Secondary | ICD-10-CM | POA: Insufficient documentation

## 2020-11-04 LAB — TYPE AND SCREEN
ABO/RH(D): B POS
Antibody Screen: NEGATIVE
Unit division: 0
Unit division: 0

## 2020-11-04 LAB — CBC
HCT: 21.5 % — ABNORMAL LOW (ref 36.0–46.0)
Hemoglobin: 6.9 g/dL — CL (ref 12.0–15.0)
MCH: 24.9 pg — ABNORMAL LOW (ref 26.0–34.0)
MCHC: 32.1 g/dL (ref 30.0–36.0)
MCV: 77.6 fL — ABNORMAL LOW (ref 80.0–100.0)
Platelets: 342 10*3/uL (ref 150–400)
RBC: 2.77 MIL/uL — ABNORMAL LOW (ref 3.87–5.11)
RDW: 17.3 % — ABNORMAL HIGH (ref 11.5–15.5)
WBC: 10.8 10*3/uL — ABNORMAL HIGH (ref 4.0–10.5)
nRBC: 0 % (ref 0.0–0.2)

## 2020-11-04 LAB — BPAM RBC
Blood Product Expiration Date: 202204072359
Blood Product Expiration Date: 202204082359
ISSUE DATE / TIME: 202203051121
Unit Type and Rh: 7300
Unit Type and Rh: 7300

## 2020-11-04 LAB — HEPARIN LEVEL (UNFRACTIONATED): Heparin Unfractionated: 0.67 IU/mL (ref 0.30–0.70)

## 2020-11-04 MED ORDER — APIXABAN 5 MG PO TABS
5.0000 mg | ORAL_TABLET | Freq: Two times a day (BID) | ORAL | Status: DC
  Administered 2020-11-04 – 2020-11-08 (×9): 5 mg via ORAL
  Filled 2020-11-04 (×9): qty 1

## 2020-11-04 MED ORDER — FERROUS SULFATE 325 (65 FE) MG PO TABS
325.0000 mg | ORAL_TABLET | Freq: Three times a day (TID) | ORAL | Status: DC
  Administered 2020-11-04 – 2020-11-08 (×13): 325 mg via ORAL
  Filled 2020-11-04 (×13): qty 1

## 2020-11-04 NOTE — Progress Notes (Addendum)
Physical Therapy Treatment Patient Details Name: Mercedes [name] MRN: [id number] DOB: 1955/10/24 Today's Date: 11/04/2020    History of Present Illness 65 yo admitted 3/3 with RLE pain s/p aortogram and stent 3/4, 3/7 thrombectomy and revision of Rt fem pop BPG. 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 Pt not oriented to time but otherwise fairly alert and oriented, able to perform bed<>drop arm recliner transfers with min guard and given instruction on RLE exercises but limited participation in RLE movements due to pain. Pt continues to benefit from PT services to progress toward functional mobility goals. Pt could likely benefit from low intensity post-acute rehab but may refuse in which case we need to confirm if her wheelchair is operable as she will likely discharge at wheelchair level to home.  Follow Up Recommendations  Home health PT;SNF;Supervision for mobility/OOB (pending activity and cognitive progression)     Equipment Recommendations  None recommended by PT (pt reports her WC is broken, manual WC with removable leg rests if this is the case)    Recommendations for Other Services OT consult     Precautions / Restrictions Precautions Precautions: Fall Required Braces or Orthoses: Other Brace Other Brace: L prosthetic - has only worn it twice (not seen in room 3/10) Restrictions Weight Bearing Restrictions: Yes RLE Weight Bearing: Weight bearing as tolerated Other Position/Activity Restrictions: Vasc MD note states "OOB ambulate unless plantar surface of R foot too sore".    Mobility  Bed Mobility Overal bed mobility: Needs Assistance Bed Mobility: Sit to Supine;Supine to  Sit     Supine to sit: HOB elevated;Min guard Sit to supine: Min guard (to guide RLE, pt using bed features/rails heavily)   General bed mobility comments: pt able to pivot/seated scoot with min guard and heavy use of bed features, mostly needs assist to remove objects/obstacles out of the way and does not tolerate tactile assist anywhere near foot/calf    Transfers Overall transfer level: Needs assistance Equipment used:  (attempted to encourage slide board for safety due to uneven bed/chair height on return tf but pt refusing) Transfers: Lateral/Scoot Transfers          Lateral/Scoot Transfers: Min guard;+2 safety/equipment (to support leg at times, at other times pt refusing and requesting staff only supervise) General transfer comment: WBAT however unable to tolerate RLE lowered to floor once EOB (only for about 30 seconds) then pt scooted to/from drop arm recliner with min guard at most; unwilling to attempt sit<>stand transfer due to severe RLE pain in dependent position, RN notified  Ambulation/Gait             General Gait Details: unable as no prosthesis present and RLE too painful   Stairs             Wheelchair Mobility    Modified Rankin (Stroke Patients Only)       Balance Overall balance assessment: Needs assistance Sitting-balance support: Single extremity supported;Feet unsupported Sitting balance-Leahy Scale: Good Sitting balance - Comments: static sitting and weight shifting/scooting EOB unable to tolerate R foot resting on floor >30 sec       Standing balance comment: pt defers, R foot too painful and no  L prosthesis in room                            Cognition Arousal/Alertness: Awake/alert Behavior During Therapy: [facility] for tasks assessed/performed;Anxious;Agitated Overall Cognitive Status: Impaired/Different from baseline Area of Impairment: Memory;Orientation;Following commands;Safety/judgement;Problem solving                  Orientation Level: Disoriented to;Time   Memory: Decreased recall of precautions;Decreased short-term memory Following Commands: Follows one step commands consistently;Follows one step commands with increased time (at times voluntarily ignoring therapist cues but seems to understand, just wants to do it her way) Safety/Judgement: Decreased awareness of safety;Decreased awareness of deficits   Problem Solving: Requires verbal cues;Requires tactile cues;Slow processing General Comments: possibly at baseline; very self-directed, pt needed reorientation to date/time of day and seemed to think she was less than 24 hrs post-op although sx was 2.5 days prior. Needed lots of instruction on benefits of therapy and reasoning for exercises/mobility but frustrated during session      Exercises General Exercises - Lower Extremity Ankle Circles/Pumps: AROM;Right;5 reps;Supine (pt only attempts 5 refuses more) Quad Sets: AROM;Right;5 reps (encouraged pt to continue t/o day due to contracture/unable to reach TKE but pt refusing to attempt more reps)    General Comments General comments (skin integrity, edema, etc.): R foot swollen/red in color and extremely tender to touch. Even pillowcase/sock being gently placed onto foot overwhelming her (attempted to have pt don but too painful due to R groin incision when RLE in figure 4 position)      Pertinent Vitals/Pain Pain Assessment: 0-10 Pain Score: 8  Pain Location: RLE and foot, especially plantar surface/toes and R groin incision Pain Descriptors / Indicators: Aching;Guarding;Tender;Grimacing;Throbbing Pain Intervention(s): Limited activity within patient's tolerance;Monitored during session;Repositioned;Premedicated before session;Patient requesting pain meds-RN notified (pt refusing ice pack for groin on R foot)    Home Living                      Prior Function            PT Goals (current goals can now be found in the care  plan section) Acute Rehab PT Goals Patient Stated Goal: decreased pain in RLE, to get back home PT Goal Formulation: With patient Time For Goal Achievement: 11/16/20 Potential to Achieve Goals: Fair Progress towards PT goals: Progressing toward goals (slow progress, pain limiting)    Frequency    Min 3X/week      PT Plan Current plan remains appropriate    Co-evaluation              AM-PAC PT "6 Clicks" Mobility   Outcome Measure  Help needed turning from your back to your side while in a flat bed without using bedrails?: A Little Help needed moving from lying on your back to sitting on the side of a flat bed without using bedrails?: A Little Help needed moving to and from a bed to a chair (including a wheelchair)?: A Little Help needed standing up from a chair using your arms (e.g., wheelchair or bedside chair)?: Total Help needed to walk in hospital room?: Total Help needed climbing 3-5 steps with a railing? : Total 6 Click Score: 12    End of Session   Activity Tolerance: Patient limited by pain Patient left: in bed;with call bell/phone within reach;with bed alarm set;Other (comment) (encouraged pt to remain in chair but she refuses due to pain)  Nurse Communication: Mobility status;Precautions;Patient requests pain meds;Weight bearing status PT Visit Diagnosis: Other abnormalities of gait and mobility (R26.89);Muscle weakness (generalized) (M62.81)     Time: 0737-1062 PT Time Calculation (min) (ACUTE ONLY): 23 min  Charges:  $Therapeutic Exercise: 8-22 mins $Therapeutic Activity: 8-22 mins                     Carly P., PTA Acute Rehabilitation Services Pager: [phone] Office: [phone]   Angus Palms 11/04/2020, 1:44 PM

## 2020-11-04 NOTE — Progress Notes (Addendum)
[facility]  Subjective  - leg and foot hurts   Objective (!) 146/64 62 97.9 F (36.6 C) (Oral) 14 97%  Intake/Output Summary (Last 24 hours) at 11/04/2020 0817 Last data filed at 11/04/2020 0700 Gross per 24 hour  Intake 1391.98 ml  Output 1520 ml  Net -128.02 ml   Peroneal DP doppler No hematoma  Blisters on foot dry  Assessment/Planning: Restart noac Home next 1-2 day if pain controlled ABL tolerating hemoglobin Will only recheck if symptomatic Hold on transfusion for now  Start iron D/c JP  Fabienne Bruns 11/04/2020 8:17 AM --  Laboratory Lab Results: Recent Labs    11/03/20 0007 11/04/20 0132  WBC 10.0 10.8*  HGB 6.8* 6.9*  HCT 21.8* 21.5*  PLT 284 342   BMET Recent Labs    11/02/20 0541 11/03/20 0007  NA 134* 133*  K 3.9 3.8  CL 105 102  CO2 19* 24  GLUCOSE 206* 103*  BUN 9 9  CREATININE 0.72 0.68  CALCIUM 8.5* 8.4*    COAG Lab Results  Component Value Date   INR 1.0 11/18/2019   INR 1.0 11/03/2019   INR 1.1 10/01/2019   No results found for: PTT

## 2020-11-04 NOTE — Care Management Important Message (Signed)
Important Message  Patient Details  Name: Mercedes [name] MRN: [id number] Date of Birth: 06/19/1956   Medicare Important Message Given:  Yes     [name] 11/04/2020, 9:42 AM

## 2020-11-04 NOTE — Progress Notes (Signed)
Hgb this AM 6.9 (up from 6.8). Pt alerted to this fact and endorsed she would take the previously ordered transfusion but that she wanted to forgo it until "the morning". Pt educated on the need for transfusion but is still adamant about waiting until Day Shift to start it. Pt informed that if she changed her mind, to inform this RN and I would initiate transfusion. Will convey to day team and continue to monitor. VSS and surgical sites unchanged from prior assessments.

## 2020-11-04 NOTE — Discharge Instructions (Signed)
Information on my medicine - ELIQUIS® (apixaban) ° °Why was Eliquis® prescribed for you? °Eliquis® was prescribed for you to reduce the risk of forming blood clots that can cause a stroke. ° °What do You need to know about Eliquis® ? °Take your Eliquis® TWICE DAILY - one tablet in the morning and one tablet in the evening with or without food.  It would be best to take the doses about the same time each day. ° °If you have difficulty swallowing the tablet whole please discuss with your pharmacist how to take the medication safely. ° °Take Eliquis® exactly as prescribed by your doctor and DO NOT stop taking Eliquis® without talking to the doctor who prescribed the medication.  Stopping may increase your risk of developing a new clot or stroke.  Refill your prescription before you run out. ° °After discharge, you should have regular check-up appointments with your healthcare provider that is prescribing your Eliquis®.  In the future your dose may need to be changed if your kidney function or weight changes by a significant amount or as you get older. ° °What do you do if you miss a dose? °If you miss a dose, take it as soon as you remember on the same day and resume taking twice daily.  Do not take more than one dose of ELIQUIS at the same time. ° °Important Safety Information °A possible side effect of Eliquis® is bleeding. You should call your healthcare provider right away if you experience any of the following: °? Bleeding from an injury or your nose that does not stop. °? Unusual colored urine (red or dark brown) or unusual colored stools (red or black). °? Unusual bruising for unknown reasons. °? A serious fall or if you hit your head (even if there is no bleeding). ° °Some medicines may interact with Eliquis® and might increase your risk of bleeding or clotting while on Eliquis®. To help avoid this, consult your healthcare provider or pharmacist prior to using any new prescription or non-prescription  medications, including herbals, vitamins, non-steroidal anti-inflammatory drugs (NSAIDs) and supplements. ° °This website has more information on Eliquis® (apixaban): www.Eliquis.com. ° ° ° °

## 2020-11-04 NOTE — Progress Notes (Signed)
ANTICOAGULATION CONSULT NOTE - Follow Up Consult  Pharmacy Consult for Heparin Indication: Critical limb ischemia s/p intervention   Labs: Recent Labs    11/01/20 2233 11/01/20 2307 11/02/20 0541 11/02/20 1300 11/03/20 0007 11/03/20 0833 11/04/20 0132  HGB 11.6*   < > 8.3*  --  6.8*  --  6.9*  HCT 34.0*   < > 26.9*  --  21.8*  --  21.5*  PLT  --    < > 308  --  284  --  342  HEPARINUNFRC  --   --   --  0.33  --  0.36 0.67  CREATININE 0.60  --  0.72  --  0.68  --   --    < > = values in this interval not displayed.    Assessment: 28 YOF s/p R leg ischemia with occluded femoropopliteal bypass s/p abdom aortogram with RLE runoff, and R common iliac stent on 3/4. Heparin restarted post procedure. On Eliquis PTA (Last dose 3/2).   S/p placement of thrombolysis infusion wire 3/7.  Patient continues on heparin this morning to ensure patency for now on heparin 800 units/hr. Hgb stable overnight to 6.9, planning transfusion later this morning. Heparin level at upper end of goal will adjust to keep within range, no overt bleeding noted. Will continue to follow for transition back to DOAC.   Goal of Therapy:  Heparin level 0.2-0.5 units/ml  Monitor platelets by anticoagulation protocol: Yes   Plan:  Reduce heparin to 750 units/hr  Daily Heparin level, CBC  Sheppard Coil PharmD., BCPS Clinical Pharmacist 11/04/2020 8:07 AM

## 2020-11-04 NOTE — Progress Notes (Signed)
Mobility Specialist - Progress Note   11/04/20 1341  Mobility  Activity Dangled on edge of bed  Level of Assistance Standby assist, set-up cues, supervision of patient - no hands on  Assistive Device None  Mobility Response Tolerated well  Mobility performed by Mobility specialist  $Mobility charge 1 Mobility   Pt refusing transfer to chair, stating it was too painful w/ PT earlier. She sat up on edge of bed and was guided through leg exercises. Pt encouraged to continue to do exercises throughout the day. HR remained in 70s throughout.   Mamie Levers Mobility Specialist Mobility Specialist Phone: [phone]

## 2020-11-04 NOTE — Progress Notes (Signed)
Occupational Therapy Treatment Patient Details Name: TANISH SINKLER MRN: [id number] DOB: 03-31-1956 Today's Date: 11/04/2020    History of present illness 65 yo admitted 3/3 with RLE pain s/p aortogram and stent 3/4, 3/7 thrombectomy and revision of Rt fem pop BPG. 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  She deferred out of bed due to continued R foot pain, but agreed to sit at the edge for a light grooming task.  OT discussed continued goals in the acute setting.  Patient is hoping her pain will subside enough over the next day or two, and she will be able to increase mobility.  OT will continue to see her in the acute setting to maximize functional status.  She is hoping to increase the hours of her HHA and continue [facility] services once discharged.    Follow Up Recommendations  Home health OT;SNF    Equipment Recommendations  Other (comment)    Recommendations for Other Services      Precautions / Restrictions Precautions Precautions: Fall Precaution Comments: left AKA Required Braces or Orthoses: Other Brace Other Brace: L prosthetic - has only worn it twice, is at home Restrictions Weight Bearing Restrictions: Yes RLE Weight Bearing: Weight bearing as tolerated Other Position/Activity Restrictions: Vasc MD note states "OOB ambulate unless plantar surface of R foot too sore".       Mobility Bed Mobility Overal bed mobility: Needs Assistance Bed Mobility: Supine to Sit;Sit to Supine     Supine to sit: HOB elevated;Supervision Sit to supine: HOB elevated;Supervision   General bed mobility comments: increased time and effort to come to the R side of the bed.  Patient very hesitant to place foot on floor.    Transfers Overall transfer level: Needs assistance Equipment used:  (attempted to encourage slide board for safety due to uneven  bed/chair height on return tf but pt refusing) Transfers: Lateral/Scoot Transfers          Lateral/Scoot Transfers: Min guard;+2 safety/equipment (to support leg at times, at other times pt refusing and requesting staff only supervise) General transfer comment: Deferred OOB.    Balance Overall balance assessment: Needs assistance Sitting-balance support: Single extremity supported;Feet unsupported Sitting balance-Leahy Scale: Fair Sitting balance - Comments: side sitting off the bed, foot not really touching the floor.       Standing balance comment: pt defers, R foot too painful and no L prosthesis in room                           ADL either performed or assessed with clinical judgement   ADL       Grooming: Set up;Sitting               Lower Body Dressing: Set up;Bed level                 General ADL Comments: declined OOB this date.  Patient able to long sit in bed to place sock to R foot.                       Cognition Arousal/Alertness: Awake/alert Behavior During Therapy: Restless Overall Cognitive Status: Within Functional Limits for tasks assessed Area of Impairment: Memory;Orientation;Following commands;Safety/judgement;Problem solving  Orientation Level: Disoriented to;Time   Memory: Decreased recall of precautions;Decreased short-term memory Following Commands: Follows one step commands consistently;Follows one step commands with increased time (at times voluntarily ignoring therapist cues but seems to understand, just wants to do it her way) Safety/Judgement: Decreased awareness of safety;Decreased awareness of deficits   Problem Solving: Requires verbal cues;Requires tactile cues;Slow processing General Comments: patient appeared to much calmer this afternoon.  She did reference earlier sessions, and that she was short with folks.        Exercises Exercises: General Lower Extremity General Exercises  - Lower Extremity Ankle Circles/Pumps: AROM;Right;5 reps;Supine (pt only attempts 5 refuses more) Quad Sets: AROM;Right;5 reps (encouraged pt to continue t/o day due to contracture/unable to reach TKE but pt refusing to attempt more reps)   Shoulder Instructions       General Comments     Pertinent Vitals/ Pain       Pain Assessment: Faces Pain Score: 8  Pain Location: RLE and foot, especially plantar surface/toes and R groin incision Pain Descriptors / Indicators: Aching;Guarding;Tender;Grimacing;Throbbing Pain Intervention(s): Limited activity within patient's tolerance;Monitored during session                                                          Frequency  Min 2X/week        Progress Toward Goals  OT Goals(current goals can now be found in the care plan section)  Progress towards OT goals: Progressing toward goals  Acute Rehab OT Goals Patient Stated Goal: I know I need to start going. OT Goal Formulation: With patient Time For Goal Achievement: 11/16/20 Potential to Achieve Goals: Good  Plan Discharge plan remains appropriate    Co-evaluation                 AM-PAC OT "6 Clicks" Daily Activity     Outcome Measure   Help from another person eating meals?: None Help from another person taking care of personal grooming?: A Little Help from another person toileting, which includes using toliet, bedpan, or urinal?: A Lot Help from another person bathing (including washing, rinsing, drying)?: A Lot Help from another person to put on and taking off regular upper body clothing?: A Little Help from another person to put on and taking off regular lower body clothing?: A Lot 6 Click Score: 16    End of Session    OT Visit Diagnosis: Other abnormalities of gait and mobility (R26.89);Muscle weakness (generalized) (M62.81);Pain Pain - Right/Left: Right Pain - part of body: Leg   Activity Tolerance Patient limited by pain   Patient  Left in bed;with call bell/phone within reach   Nurse Communication          Time: 6546-5035 OT Time Calculation (min): 13 min  Charges: OT General Charges $OT Visit: 1 Visit OT Treatments $Self Care/Home Management : 8-22 mins  11/04/2020  Rich, OTR/L  Acute Rehabilitation Services  Office:  [phone]    Suzanna Obey 11/04/2020, 4:01 PM

## 2020-11-05 NOTE — Progress Notes (Signed)
Physical Therapy Treatment Patient Details Name: Mercedes [name] MRN: [id number] DOB: Jun 19, 1956 Today's Date: 11/05/2020    History of Present Illness 65 yo admitted 3/3 with RLE pain s/p aortogram and stent 3/4, 3/7 thrombectomy and revision of Rt fem pop BPG. 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  Emphasis on best technique, transfering both directions and discussion of all the other ADL/mobility activities that she is not yet ready/able to safely accomplish.   SNF would still be the best option for pt's safety and to gain valuable amputee/mobility skills.    Follow Up Recommendations  SNF;Other (comment);Supervision/Assistance - 24 hour (pt agrees to give SNF serious thought, HHPT if she declines SNF)     Equipment Recommendations  None recommended by PT    Recommendations for Other Services       Precautions / Restrictions Precautions Precautions: Fall Precaution Comments: left AKA Other Brace: L prosthetic - has only worn it twice, is at home Restrictions RLE Weight Bearing: Weight bearing as tolerated    Mobility  Bed Mobility Overal bed mobility: Needs Assistance Bed Mobility: Supine to Sit;Sit to Supine     Supine to sit: Supervision;Modified independent (Device/Increase time) Sit to supine: Supervision;Modified independent (Device/Increase time)   General bed mobility comments: increase time to move without hitting her foot.    Transfers Overall transfer level: Needs assistance Equipment used: None Transfers: Squat Pivot Transfers     Squat pivot transfers: Min guard;Min assist     General transfer comment: squat pivot to/from BSC from the bed.  Pt slow to work up the "nerve" to w/bear on the right foot, R foot quickly increasing in pain in a depedent position.  Toward the [facility]  needed min boost assist to clear the arm rest.  On the return toward the L side, the armrest was not an obstacle and pt completed with min guard.  The R foot was painful both attempts and pt quick propped her foot up.  Ambulation/Gait             General Gait Details: unable to stand with w/bearing on the right foot.   Stairs             Wheelchair Mobility    Modified Rankin (Stroke Patients Only)       Balance   Sitting-balance support: Feet supported Sitting balance-Leahy Scale: Good                                      Cognition Arousal/Alertness: Awake/alert Behavior During Therapy: WFL for tasks assessed/performed Overall Cognitive Status: Within Functional Limits for tasks assessed                                        Exercises      General Comments General comments (skin integrity, edema, etc.): Discussed reasons why the best d/c plan would be ST SNF to build on past skills and pick up a few other mobility and safety skills.      Pertinent Vitals/Pain Pain Assessment: Faces Faces Pain Scale: Hurts even more Pain Location: R foot plantar then transition to  instep. Pain Descriptors / Indicators: Aching;Guarding;Grimacing;Burning;Sharp Pain Intervention(s): Limited activity within patient's tolerance;Monitored during session;Patient requesting pain meds-RN notified    Home Living                      Prior Function            PT Goals (current goals can now be found in the care plan section) Acute Rehab PT Goals Patient Stated Goal: I know I need to start going. PT Goal Formulation: With patient Time For Goal Achievement: 11/16/20 Potential to Achieve Goals: Fair Progress towards PT goals: Progressing toward goals    Frequency    Min 3X/week      PT Plan Current plan remains appropriate    Co-evaluation              AM-PAC PT "6 Clicks" Mobility   Outcome Measure  Help needed  turning from your back to your side while in a flat bed without using bedrails?: None Help needed moving from lying on your back to sitting on the side of a flat bed without using bedrails?: None Help needed moving to and from a bed to a chair (including a wheelchair)?: A Little Help needed standing up from a chair using your arms (e.g., wheelchair or bedside chair)?: A Lot Help needed to walk in hospital room?: Total Help needed climbing 3-5 steps with a railing? : Total 6 Click Score: 15    End of Session   Activity Tolerance: Patient limited by pain Patient left: in bed;with call bell/phone within reach;with bed alarm set Nurse Communication: Mobility status PT Visit Diagnosis: Other abnormalities of gait and mobility (R26.89);Pain Pain - Right/Left: Right Pain - part of body: Leg     Time: 8177-1165 PT Time Calculation (min) (ACUTE ONLY): 50 min  Charges:  $Therapeutic Activity: 23-37 mins $Self Care/Home Management: 8-22                     11/05/2020  Jacinto Halim., PT Acute Rehabilitation Services [phone]  (pager) [phone]  (office)   [name] [name] 11/05/2020, 3:58 PM

## 2020-11-05 NOTE — TOC Progression Note (Signed)
Transition of Care (TOC) - Progression Note  Donn Pierini RN, BSN Transitions of Care Unit 4E- RN Case Manager See Treatment Team for direct phone #    Patient Details  Name: Mercedes [name] MRN: [id number] Date of Birth: 05-02-1956  Transition of Care [facility]) CM/SW Contact  Zenda Alpers, Lenn Sink, RN Phone Number: 11/05/2020, 1:31 PM  Clinical Narrative:    Noted orders for HHPT/OT, spoke with pt to f/u on Choice and discuss transition needs- per bedside conversation with pt she states she does not really have a preference for [facility], she is fine with using [facility] again, but also open to using another agency- discussed using [facility] with her as they have a working relationship with the Vascular practice. Pt agreeable to check with [facility] first and use [facility] as a backup. Pt has all needed DME at home, family to transport home. Pt voiced she is not really interested in SNF rehab, would prefer to go home.   Re-visited CAPS/PCS needs from previous conversation, this Clinical research associate did send msg to PCP regarding CAPS application that pt voiced she had faxed to PCP- msg requesting PCP to please f/u on application for patient.   Also regarding PCS needs- pt currently only gets 2 hr/day- in speaking with [facility] - provider here could document need for increased hours and have paperwork faxed into [facility]. This would be reviewed within 48 hrs of receiving during normal business hours for expedited review.  Paperwork has been placed on shadow chart- if MD agreeable - please fill out sec. 1-6- TOC will f/u and fax paperwork to [facility].   Call made to [facility] for [name] referral - spoke with Misty Stanley, will need to f/u on Monday to see if they can provide services.    Expected Discharge Plan: Home w Home Health Services Barriers to Discharge: Continued Medical Work up  Expected Discharge Plan and Services Expected Discharge Plan: Home w Home Health Services   Discharge Planning Services: CM Consult Post  Acute Care Choice: Home Health Living arrangements for the past 2 months: Single Family Home                   DME Agency: NA                   Social Determinants of Health (SDOH) Interventions    Readmission Risk Interventions Readmission Risk Prevention Plan 09/08/2020 10/06/2019 05/16/2019  Transportation Screening Complete Complete Complete  PCP or Specialist Appt within 5-7 Days Complete Complete Complete  Home Care Screening Complete Complete Complete  Medication Review (RN CM) Complete Complete Complete  Some recent data might be hidden

## 2020-11-05 NOTE — Progress Notes (Addendum)
Progress Note    11/05/2020 7:35 AM 4 Days Post-Op  Subjective:  Says her foot feels better.  Says when PT worked with her yesterday, they accidentally let the leg raise on the chair hit her foot.  She says there is some redness there now.  Wants to go home.  Says she has a wheelchair.  Upon further discussion, says she didn't let her foot touch the ground yesterday.    Afebrile HR 60's-80's NSR 120's-130's systolic 97% RA   Vitals:   11/04/20 2309 11/05/20 0358  BP: 130/76 128/70  Pulse: 81 83  Resp: 19 13  Temp: 98.3 F (36.8 C) 98.6 F (37 C)  SpO2: 95% 99%    Physical Exam: Cardiac:  regular Lungs:  Non labored Incisions:  Right groin and right below knee incisions healing Extremities:  Brisk right DP/peroneal doppler signals; blister on plantar surface stable; mild redness around the heel   CBC    Component Value Date/Time   WBC 10.8 (H) 11/04/2020 0132   RBC 2.77 (L) 11/04/2020 0132   HGB 6.9 (LL) 11/04/2020 0132   HGB 14.2 10/09/2018 1125   HCT 21.5 (L) 11/04/2020 0132   HCT 44.6 10/09/2018 1125   PLT 342 11/04/2020 0132   PLT 289 10/09/2018 1125   MCV 77.6 (L) 11/04/2020 0132   MCV 79 10/09/2018 1125   MCH 24.9 (L) 11/04/2020 0132   MCHC 32.1 11/04/2020 0132   RDW 17.3 (H) 11/04/2020 0132   RDW 14.8 10/09/2018 1125   LYMPHSABS 1.7 08/27/2020 0056   MONOABS 0.4 08/27/2020 0056   EOSABS 0.4 08/27/2020 0056   BASOSABS 0.0 08/27/2020 0056    BMET    Component Value Date/Time   NA 133 (L) 11/03/2020 0007   NA 135 10/09/2018 1125   K 3.8 11/03/2020 0007   CL 102 11/03/2020 0007   CO2 24 11/03/2020 0007   GLUCOSE 103 (H) 11/03/2020 0007   BUN 9 11/03/2020 0007   BUN 10 10/09/2018 1125   CREATININE 0.68 11/03/2020 0007   CREATININE 1.13 (H) 08/03/2016 1440   CALCIUM 8.4 (L) 11/03/2020 0007   GFRNONAA >60 11/03/2020 0007   GFRAA >60 11/24/2019 0620    INR    Component Value Date/Time   INR 1.0 11/18/2019 1844     Intake/Output Summary  (Last 24 hours) at 11/05/2020 0735 Last data filed at 11/04/2020 1630 Gross per 24 hour  Intake 360 ml  Output 450 ml  Net -90 ml     Assessment:  66 y.o. female is s/p:  redo R femoral to TP trunk bypass with PTFE  4 Days Post-Op  Plan: -pt with brisk right DP/peroneal doppler signals -blister on plantar surface stable.  She does have some erythema around the heel.  Unsure if this is from the chair yesterday.  May need abx but will defer to Dr. Darrick Penna -pt wants to go home today.  May benefit from more PT/OT prior to discharge. -DVT prophylaxis:  eliquis restarted yesterday -will dc on iron as well as aspirin (she was not on this at home)   Doreatha Massed, PA-C [facility] [phone] 11/05/2020 7:35 AM  Blisters on bottom of foot drying out Agree with above Will keep here over the weekend to work on mobility She may not be able to bear weight on foot for a few weeks due to pain from blisters.  She does feel comfortable going home with wheelchair If unable to get around safely will pursue SNF Monday  Continue eliquis No lab draws unless clinical condition changes Acute blood loss anemia Transfuse if dyspnea weakness tachycardia or hypotension  Fabienne Bruns, MD [facility] [place] Office: [phone]

## 2020-11-06 MED ORDER — GABAPENTIN 300 MG PO CAPS
300.0000 mg | ORAL_CAPSULE | Freq: Two times a day (BID) | ORAL | Status: DC
  Administered 2020-11-06 – 2020-11-08 (×5): 300 mg via ORAL
  Filled 2020-11-06 (×5): qty 1

## 2020-11-06 NOTE — Progress Notes (Signed)
    Subjective  - POD #5  C/o burning in her right foot (this was a pre-op problem)   Physical Exam:  Monophasic pedal doppler signals Incisions clean and dry.   Compartments soft       Assessment/Plan:  POD #5  Baseline neuropathic pain likely exacerbated by revascularization.  Will add neurontin Encourage mobility On Eliquis Acute blood loss anemia:  Check CBC in am  Wells Beckham Buxbaum 11/06/2020 10:14 AM --  Vitals:   11/06/20 0306 11/06/20 0832  BP: 131/70 130/70  Pulse: 70 77  Resp: 15 19  Temp: 98.9 F (37.2 C) 99.4 F (37.4 C)  SpO2: 98% 97%    Intake/Output Summary (Last 24 hours) at 11/06/2020 1014 Last data filed at 11/05/2020 2045 Gross per 24 hour  Intake 120 ml  Output 500 ml  Net -380 ml     Laboratory CBC    Component Value Date/Time   WBC 10.8 (H) 11/04/2020 0132   HGB 6.9 (LL) 11/04/2020 0132   HGB 14.2 10/09/2018 1125   HCT 21.5 (L) 11/04/2020 0132   HCT 44.6 10/09/2018 1125   PLT 342 11/04/2020 0132   PLT 289 10/09/2018 1125    BMET    Component Value Date/Time   NA 133 (L) 11/03/2020 0007   NA 135 10/09/2018 1125   K 3.8 11/03/2020 0007   CL 102 11/03/2020 0007   CO2 24 11/03/2020 0007   GLUCOSE 103 (H) 11/03/2020 0007   BUN 9 11/03/2020 0007   BUN 10 10/09/2018 1125   CREATININE 0.68 11/03/2020 0007   CREATININE 1.13 (H) 08/03/2016 1440   CALCIUM 8.4 (L) 11/03/2020 0007   GFRNONAA >60 11/03/2020 0007   GFRAA >60 11/24/2019 0620    COAG Lab Results  Component Value Date   INR 1.0 11/18/2019   INR 1.0 11/03/2019   INR 1.1 10/01/2019   No results found for: PTT  Antibiotics Anti-infectives (From admission, onward)   Start     Dose/Rate Route Frequency Ordered Stop   11/02/20 0800  ceFAZolin (ANCEF) IVPB 2g/100 mL premix        2 g 200 mL/hr over 30 Minutes Intravenous Every 8 hours 11/02/20 0428 11/02/20 1638       V. Charlena Cross, M.D., [facility] [place] Office:  [phone] Pager:  [phone]

## 2020-11-06 NOTE — Progress Notes (Signed)
Mobility Specialist: Progress Note   11/06/20 1446  Mobility  Activity Dangled on edge of bed  Level of Assistance Independent  Assistive Device None  Mobility Response Tolerated well  Mobility performed by Mobility specialist  $Mobility charge 1 Mobility   Post-Mobility: 80 HR  Pt c/o 3-4/10 pain in her R foot while sitting EOB. Encouraged pt sit EOB and dangle more as well as perform leg exercises and ankle mobility.Pt back to bed after session.   [facility] Nathanial Arrighi Mobility Specialist Mobility Specialist Phone: [phone]

## 2020-11-06 NOTE — Progress Notes (Signed)
   VASCULAR SURGERY ASSESSMENT & PLAN:   5 Days Post-Op 65 y.o. female is s/p: redo R femoral to TP trunk bypass with PTFE. VSS. Tm 99.4 Right LE well perfused. On aspirin, apixaban and statin. When asked about her willingness to go to SNF as per PT recs, she states "this will all be taken care of on Monday". Presume she is referring to [facility] follow-up regarding home health provider. I completed requested paperwork in this regard.   SUBJECTIVE:   Complaining of burning pain to plantar surface of right foot.  PHYSICAL EXAM:   Vitals:   11/05/20 1937 11/05/20 2355 11/06/20 0306 11/06/20 0832  BP: 124/63 124/71 131/70 130/70  Pulse: 77 69 70 77  Resp: 16 18 15 19   Temp: 98.9 F (37.2 C) 98.3 F (36.8 C) 98.9 F (37.2 C) 99.4 F (37.4 C)  TempSrc: Oral Oral Oral Oral  SpO2: 99% 99% 98% 97%  Weight:      Height:       General appearance: Awake, alert in no apparent distress Cardiac: Heart rate and rhythm are regular Respirations: Nonlabored Incisions: right groin and lower leg incisions are all well approximated without bleeding or hematoma Extremities: right foot warm with intact sensation and motor function.  Brisk  posterior tibial and peroneal artery Doppler signals on the right. Intact bullous lesions of plantar surface to right foot.   LABS:   Lab Results  Component Value Date   WBC 10.8 (H) 11/04/2020   HGB 6.9 (LL) 11/04/2020   HCT 21.5 (L) 11/04/2020   MCV 77.6 (L) 11/04/2020   PLT 342 11/04/2020   Lab Results  Component Value Date   CREATININE 0.68 11/03/2020   Lab Results  Component Value Date   INR 1.0 11/18/2019   CBG (last 3)  No results for input(s): GLUCAP in the last 72 hours.  PROBLEM LIST:    Active Problems:   Ischemia of extremity   Malnutrition of moderate degree   CURRENT MEDS:   . sodium chloride   Intravenous Once  . apixaban  5 mg Oral BID  . aspirin EC  81 mg Oral Q0600  . Chlorhexidine Gluconate Cloth  6 each Topical Daily  .  docusate sodium  100 mg Oral Daily  . feeding supplement  237 mL Oral BID BM  . ferrous sulfate  325 mg Oral TID WC  . metoprolol tartrate  12.5 mg Oral BID  . multivitamin with minerals  1 tablet Oral Daily  . pantoprazole  40 mg Oral Daily  . rosuvastatin  40 mg Oral QHS   11/20/2019, Milinda Antis Office: [phone] 11/06/2020

## 2020-11-07 LAB — CBC
HCT: 23.7 % — ABNORMAL LOW (ref 36.0–46.0)
Hemoglobin: 7.3 g/dL — ABNORMAL LOW (ref 12.0–15.0)
MCH: 24.3 pg — ABNORMAL LOW (ref 26.0–34.0)
MCHC: 30.8 g/dL (ref 30.0–36.0)
MCV: 78.7 fL — ABNORMAL LOW (ref 80.0–100.0)
Platelets: 607 10*3/uL — ABNORMAL HIGH (ref 150–400)
RBC: 3.01 MIL/uL — ABNORMAL LOW (ref 3.87–5.11)
RDW: 17.9 % — ABNORMAL HIGH (ref 11.5–15.5)
WBC: 10.1 10*3/uL (ref 4.0–10.5)
nRBC: 0.2 % (ref 0.0–0.2)

## 2020-11-07 LAB — BASIC METABOLIC PANEL
Anion gap: 5 (ref 5–15)
BUN: 11 mg/dL (ref 8–23)
CO2: 27 mmol/L (ref 22–32)
Calcium: 8.9 mg/dL (ref 8.9–10.3)
Chloride: 100 mmol/L (ref 98–111)
Creatinine, Ser: 0.76 mg/dL (ref 0.44–1.00)
GFR, Estimated: >60 mL/min (ref >60)
Glucose, Bld: 112 mg/dL — ABNORMAL HIGH (ref 70–99)
Potassium: 4.1 mmol/L (ref 3.5–5.1)
Sodium: 132 mmol/L — ABNORMAL LOW (ref 135–145)

## 2020-11-07 NOTE — Progress Notes (Signed)
Mobility Specialist: Progress Note   11/07/20 1455  Mobility  Activity Transferred:  Chair to bed  Level of Assistance Standby assist, set-up cues, supervision of patient - no hands on  Assistive Device Sliding board  Mobility Response Tolerated well  Mobility performed by Mobility specialist  $Mobility charge 1 Mobility   Pre-Mobility: 75 HR Post-Mobility: 71 HR  Pt asx during transfer. Pt has call bell and phone at her side.   [facility] Dajaun Goldring Mobility Specialist Mobility Specialist Phone: [phone]

## 2020-11-07 NOTE — Progress Notes (Signed)
Mobility Specialist: Progress Note   11/07/20 1231  Mobility  Activity Transferred:  Bed to chair  Level of Assistance Independent  Assistive Device None  Mobility Response Tolerated well  Mobility performed by Mobility specialist  Bed Position Chair  $Mobility charge 1 Mobility   Pre-Mobility: 85 HR Post-Mobility: 79 HR, 145/82 BP, 99% SpO2  Pt c/o 6-7/10 pain in R foot post transfer and is requesting pain medication, RN notified. Encouraged pt to sit in chair for at least an hour. Will f/u later.   [facility] Ricardo Kayes Mobility Specialist Mobility Specialist Phone: [phone]

## 2020-11-07 NOTE — Progress Notes (Signed)
    Subjective  - POD #6  Says the burning in her foot is better after Neurontin   Physical Exam:  +pedal doppler signals Stable blister on heel Incisions clean and dry       Assessment/Plan:  POD #6  Neuropathic pain improved with neurontin, may be able to titrate to a higher dose later Encouraged mobility today Continue Eliquis\ Hb stable this am  Wells Brabham 11/07/2020 10:03 AM --  Vitals:   11/07/20 0759 11/07/20 0941  BP: 113/75 117/70  Pulse: 73   Resp: 18 16  Temp: 99.3 F (37.4 C)   SpO2: 95%     Intake/Output Summary (Last 24 hours) at 11/07/2020 1003 Last data filed at 11/07/2020 0900 Gross per 24 hour  Intake 480 ml  Output 275 ml  Net 205 ml     Laboratory CBC    Component Value Date/Time   WBC 10.1 11/07/2020 0339   HGB 7.3 (L) 11/07/2020 0339   HGB 14.2 10/09/2018 1125   HCT 23.7 (L) 11/07/2020 0339   HCT 44.6 10/09/2018 1125   PLT 607 (H) 11/07/2020 0339   PLT 289 10/09/2018 1125    BMET    Component Value Date/Time   NA 132 (L) 11/07/2020 0339   NA 135 10/09/2018 1125   K 4.1 11/07/2020 0339   CL 100 11/07/2020 0339   CO2 27 11/07/2020 0339   GLUCOSE 112 (H) 11/07/2020 0339   BUN 11 11/07/2020 0339   BUN 10 10/09/2018 1125   CREATININE 0.76 11/07/2020 0339   CREATININE 1.13 (H) 08/03/2016 1440   CALCIUM 8.9 11/07/2020 0339   GFRNONAA >60 11/07/2020 0339   GFRAA >60 11/24/2019 0620    COAG Lab Results  Component Value Date   INR 1.0 11/18/2019   INR 1.0 11/03/2019   INR 1.1 10/01/2019   No results found for: PTT  Antibiotics Anti-infectives (From admission, onward)   Start     Dose/Rate Route Frequency Ordered Stop   11/02/20 0800  ceFAZolin (ANCEF) IVPB 2g/100 mL premix        2 g 200 mL/hr over 30 Minutes Intravenous Every 8 hours 11/02/20 0428 11/02/20 1638       V. Charlena Cross, M.D., [facility] [place] Office: [phone] Pager:  [phone]

## 2020-11-07 NOTE — TOC Progression Note (Addendum)
Transition of Care [facility]) - Progression Note    Patient Details  Name: LELA MURFIN MRN: [id number] Date of Birth: 1955-12-30  Transition of Care [facility]) CM/SW Contact  Eduard Roux, [place] Phone Number: 11/07/2020, 1:21 PM  Clinical Narrative:     CSW faxed Signed PCS forms to [facility] on behalf of the patient - for them to  review for consideration of  increased hrs- fax confirmation received.  Antony Blackbird, MSW, LCSW Clinical Social Worker   Expected Discharge Plan: Home w Home Health Services Barriers to Discharge: Continued Medical Work up  Expected Discharge Plan and Services Expected Discharge Plan: Home w Home Health Services   Discharge Planning Services: CM Consult Post Acute Care Choice: Home Health Living arrangements for the past 2 months: Single Family Home                   DME Agency: NA                   Social Determinants of Health (SDOH) Interventions    Readmission Risk Interventions Readmission Risk Prevention Plan 09/08/2020 10/06/2019 05/16/2019  Transportation Screening Complete Complete Complete  PCP or Specialist Appt within 5-7 Days Complete Complete Complete  Home Care Screening Complete Complete Complete  Medication Review (RN CM) Complete Complete Complete  Some recent data might be hidden

## 2020-11-08 MED ORDER — GABAPENTIN 300 MG PO CAPS: 300.0000 mg | ORAL_CAPSULE | Freq: Two times a day (BID) | ORAL | 3 refills | Status: DC

## 2020-11-08 MED ORDER — FERROUS SULFATE 325 (65 FE) MG PO TABS: 325.0000 mg | ORAL_TABLET | Freq: Three times a day (TID) | ORAL | 1 refills | Status: DC

## 2020-11-08 MED ORDER — ADULT MULTIVITAMIN W/MINERALS CH: 1.0000 | ORAL_TABLET | Freq: Every day | ORAL | Status: DC

## 2020-11-08 MED ORDER — ASPIRIN 81 MG PO TBEC: 81.0000 mg | DELAYED_RELEASE_TABLET | Freq: Every day | ORAL | Status: DC

## 2020-11-08 MED ORDER — OXYCODONE-ACETAMINOPHEN 5-325 MG PO TABS: 1.0000 | ORAL_TABLET | Freq: Four times a day (QID) | ORAL | 0 refills | Status: DC | PRN

## 2020-11-08 NOTE — TOC Transition Note (Addendum)
Transition of Care [facility]) - CM/SW Discharge Note   Patient Details  Name: Mercedes [name] MRN: [id number] Date of Birth: 08/31/1955  Transition of Care [facility]) CM/SW Contact:  Leone Haven, RN Phone Number: 11/08/2020, 10:23 AM   Clinical Narrative:    Patient is for dc today, Amy with [facility] states they can take the referral for [facility] services. HHPT, HHOT.  Soc will begin 24 to 48 hrs post dc.  Also she has  PCS services , and Frances Furbish does this for her, she gets 2 hrs with them per previous NCM note, she wants to increase her hours.  Looks like this request was faxed in.  Patient wants to know when Frances Furbish will be coming out.  NCM is contacting [facility] to find out.  Patient may need transport at discharge.  She is to let her Staff RN know If this NCM needs to assist  Her with transport.   Rep with PCS services states it will take 2 to 3 days for them to received the faxed form. NCM spoke with [facility] , they will be out to see the patient tomorrow from 1-3, this infor was given to patient.     Final next level of care: Home w Home Health Services Barriers to Discharge: Transportation   Patient Goals and CMS Choice Patient states their goals for this hospitalization and ongoing recovery are:: home with [facility] CMS Medicare.gov Compare Post Acute Care list provided to:: Patient Choice offered to / list presented to : Patient  Discharge Placement                       Discharge Plan and Services   Discharge Planning Services: CM Consult Post Acute Care Choice: Home Health            DME Agency: NA       HH Arranged: PT,OT HH Agency: [facility] Date [facility] Agency Contacted: 11/08/20 Time HH Agency Contacted: 1023 Representative spoke with at [facility] Agency: Amy  Social Determinants of Health (SDOH) Interventions     Readmission Risk Interventions Readmission Risk Prevention Plan 09/08/2020 10/06/2019 05/16/2019  Transportation Screening Complete Complete Complete   PCP or Specialist Appt within 5-7 Days Complete Complete Complete  Home Care Screening Complete Complete Complete  Medication Review (RN CM) Complete Complete Complete  Some recent data might be hidden

## 2020-11-08 NOTE — Progress Notes (Signed)
Discharge orders received this morning.  Pt was concerned that she would not be able to find a ride, but has someone that will be able to pick her up around 12:30p.m.  IV and telemetry removed, CCMD notified.  Case mgt has discussed HH arrangements with patient and patient states understanding and feels comfortable with the plan.  Discharge paperwork reviewed with patient including medications, home care and follow up appts.  She expresses understanding, no further questions at this time.

## 2020-11-08 NOTE — TOC Progression Note (Signed)
Transition of Care [facility]) - Progression Note    Patient Details  Name: LULAR LETSON MRN: [id number] Date of Birth: Mar 01, 1956  Transition of Care [facility]) CM/SW Contact  Leone Haven, RN Phone Number: 11/08/2020, 9:30 AM  Clinical Narrative:    NCM contacted Amy with [facility] this am to see if they are able to take referral that previous NCM made.  Awaiting call back.    Expected Discharge Plan: Home w Home Health Services Barriers to Discharge: Continued Medical Work up  Expected Discharge Plan and Services Expected Discharge Plan: Home w Home Health Services   Discharge Planning Services: CM Consult Post Acute Care Choice: Home Health Living arrangements for the past 2 months: Single Family Home Expected Discharge Date: 11/08/20                 DME Agency: NA                   Social Determinants of Health (SDOH) Interventions    Readmission Risk Interventions Readmission Risk Prevention Plan 09/08/2020 10/06/2019 05/16/2019  Transportation Screening Complete Complete Complete  PCP or Specialist Appt within 5-7 Days Complete Complete Complete  Home Care Screening Complete Complete Complete  Medication Review (RN CM) Complete Complete Complete  Some recent data might be hidden

## 2020-11-08 NOTE — Progress Notes (Signed)
Physical Therapy Treatment Patient Details Name: Mercedes [name] MRN: [id number] DOB: May 07, 1956 Today's Date: 11/08/2020    History of Present Illness 65 yo admitted 3/3 with RLE pain s/p aortogram and stent 3/4, 3/7 thrombectomy and revision of Rt fem pop BPG. 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  Emphasis on education, progression of activity at home and in prep for restarting work with prosthesis.  Work on safe transfers with w/bearing    Follow Up Recommendations  Home health PT;Supervision - Intermittent     Equipment Recommendations  None recommended by PT    Recommendations for Other Services       Precautions / Restrictions Precautions Precautions: Fall Restrictions RLE Weight Bearing: Weight bearing as tolerated LLE Weight Bearing: Non weight bearing    Mobility  Bed Mobility Overal bed mobility: Modified Independent             General bed mobility comments: much improve ability to boost and scoot.    Transfers Overall transfer level: Needs assistance Equipment used: None Transfers: Squat Pivot Transfers     Squat pivot transfers: Supervision;Modified independent (Device/Increase time)     General transfer comment: pt able to put her foot on the floor and pivot to clear her hips for attaining a higher level surface in one pivotal movement.  Ambulation/Gait                 Stairs             Wheelchair Mobility    Modified Rankin (Stroke Patients Only)       Balance Overall balance assessment: Needs assistance Sitting-balance support: Feet supported;Feet unsupported;No upper extremity supported Sitting balance-Leahy Scale: Good                                      Cognition Arousal/Alertness: Awake/alert Behavior During Therapy: WFL for tasks  assessed/performed Overall Cognitive Status: Within Functional Limits for tasks assessed                                        Exercises General Exercises - Lower Extremity Long Arc Quad: AROM;10 reps;Right;Seated Hip ABduction/ADduction: AROM;5 reps;Seated;Both Straight Leg Raises: AROM;Right;5 reps;Seated Hip Flexion/Marching: AROM;Strengthening;Both;10 reps;Seated (resisted L)    General Comments General comments (skin integrity, edema, etc.): Pt's foot much improved with much less pain, ability to place it on the floor for pivot transfers.  Per dopplers good flow.  VSS during activity.      Pertinent Vitals/Pain Pain Assessment: 0-10 Pain Score: 3  Pain Location: R plantar surface Pain Descriptors / Indicators: Discomfort;Numbness Pain Intervention(s): Monitored during session    Home Living                      Prior Function            PT Goals (current goals can now be found in the care plan section) Acute Rehab PT Goals PT Goal Formulation: With patient Time For Goal Achievement: 11/16/20 Potential to Achieve Goals: Fair Progress towards PT goals: Progressing toward goals    Frequency    Min 3X/week  PT Plan Current plan remains appropriate    Co-evaluation              AM-PAC PT "6 Clicks" Mobility   Outcome Measure  Help needed turning from your back to your side while in a flat bed without using bedrails?: None Help needed moving from lying on your back to sitting on the side of a flat bed without using bedrails?: None Help needed moving to and from a bed to a chair (including a wheelchair)?: None Help needed standing up from a chair using your arms (e.g., wheelchair or bedside chair)?: A Lot Help needed to walk in hospital room?: A Lot Help needed climbing 3-5 steps with a railing? : A Lot 6 Click Score: 18    End of Session   Activity Tolerance: Patient tolerated treatment well Patient left: in bed;with  call bell/phone within reach Nurse Communication: Mobility status PT Visit Diagnosis: Other abnormalities of gait and mobility (R26.89);Pain Pain - Right/Left: Right Pain - part of body: Leg     Time: 5784-6962 PT Time Calculation (min) (ACUTE ONLY): 29 min  Charges:  $Therapeutic Exercise: 8-22 mins $Therapeutic Activity: 8-22 mins                     11/08/2020  Jacinto Halim., PT Acute Rehabilitation Services [phone]  (pager) [phone]  (office)   [name] Allana Shrestha 11/08/2020, 11:32 AM

## 2020-11-08 NOTE — Care Management Important Message (Signed)
Important Message  Patient Details  Name: NEILA TEEM MRN: [id number] Date of Birth: Dec 01, 1955   Medicare Important Message Given:  Yes     [name] 11/08/2020, 10:07 AM

## 2020-11-08 NOTE — Discharge Summary (Signed)
Bypass Discharge Summary Patient ID: Mercedes [name] [id number] 64 y.o. 09/06/1955  Admit date: 10/29/2020  Discharge date and time: 11/08/2020  1:14 PM   Admitting Physician: Sherren Kerns, MD   Discharge Physician: same  Admission Diagnoses: Ischemia of extremity [I99.8]  Discharge Diagnoses: same  Admission Condition: poor  Discharged Condition: fair  Indication for Admission: ischemia of right leg  Hospital Course: [name] is a 65 year old female who was seen in the office on 10/28/2020 and found to have an occluded right lower extremity bypass graft.  The following day she was brought in as an outpatient and attempted initiation of thrombolysis.  Due to left iliac occlusion a lysis catheter was unable to be placed and patient was admitted to the hospital and kept on IV heparin over the weekend.  She returned to the catheterization lab on 11/01/2020 and underwent antegrade puncture and insertion of thrombolysis infusion catheter of right lower extremity down to the peroneal artery by Dr. Darrick Penna.  Early in the morning on 11/02/2020 due to right lower extremity ischemia she was brought urgently to the operating room for thrombectomy and revision of right femoral to TP trunk bypass with PTFE with endarterectomy and patch angioplasty of TP trunk.  The remainder of her hospital stay consisted of increasing mobility and postoperative pain control.  It should also be noted that she had an ischemic ulceration of her right heel which shows signs of healing at the time of discharge home.  She will continue her Eliquis at discharge.  Her incisions appear to be healing well.  She will follow-up in office in 2 to 3 weeks to reevaluate right lower extremity circulation.  She was discharged home with narcotic pain medication in addition to Neurontin which seems to control her postoperative pain.  She was discharged home in stable condition with home health.  Consults: None  Treatments:  surgery: Aortogram with right lower extremity runoff and right common iliac stent by Dr. Darrick Penna on 10/29/2020  Antegrade puncture and insertion of thrombolysis infusion catheter of right lower extremity by Dr. Darrick Penna on 11/01/2020  Thrombectomy and revision R femoral to TP trunk bypass with PTFE and endarterectomy tp trunk with patch angioplasty 11/02/20   Disposition: Discharge disposition: 01-Home or Self Care       - For [facility] Registry use ---  Post-op:  Wound infection: No  Graft infection: No  Transfusion: No   New Arrhythmia: No Patency judged by: [x ] Dopper only, [ ]  Palpable graft pulse, [ ]  Palpable distal pulse, [ ]  ABI inc. > 0.15, [ ]  Duplex D/C Ambulatory Status: Ambulatory with Assistance  Complications: MI: [x ] No, [ ]  Troponin only, [ ]  EKG or Clinical CHF: No Resp failure: [x ] none, [ ]  Pneumonia, [ ]  Ventilator Chg in renal function: [x ] none, [ ]  Inc. Cr > 0.5, [ ]  Temp. Dialysis, [ ]  Permanent dialysis Stroke: [x ] None, [ ]  Minor, [ ]  Major Return to OR: Yes  Reason for return to OR: [ ]  Bleeding, [ ]  Infection, [ ]  Thrombosis, [x ] Revision  Discharge medications: Statin use:  Yes ASA use:  Yes Plavix use:  No  for medical reason not indicated Beta blocker use: Yes Coumadin use: Yes, Eliquis    Patient Instructions:  Allergies as of 11/08/2020      Reactions   Brilinta [ticagrelor] Anaphylaxis, Shortness Of Breath, Other (See Comments)   Also, chest tightness   Latex Rash   Tape Rash  Medication List    TAKE these medications   acetaminophen 500 MG tablet Commonly known as: TYLENOL Take 500-1,000 mg by mouth every 8 (eight) hours as needed for mild pain or headache.   aspirin 81 MG EC tablet Take 1 tablet (81 mg total) by mouth daily at 6 (six) AM. Swallow whole.   Eliquis 5 MG Tabs tablet Generic drug: apixaban TAKE 1 TABLET BY MOUTH TWICE DAILY What changed: how much to take   Ensure Max Protein Liqd Take 8 oz by mouth daily.    ezetimibe 10 MG tablet Commonly known as: Zetia Take 1 tablet (10 mg total) by mouth daily.   ferrous sulfate 325 (65 FE) MG tablet Take 1 tablet (325 mg total) by mouth 3 (three) times daily with meals.   gabapentin 300 MG capsule Commonly known as: NEURONTIN Take 1 capsule (300 mg total) by mouth 2 (two) times daily.   metoprolol tartrate 25 MG tablet Commonly known as: LOPRESSOR Take 0.5 tablets (12.5 mg total) by mouth 2 (two) times daily.   multivitamin with minerals Tabs tablet Take 1 tablet by mouth daily.   nitroGLYCERIN 0.4 MG SL tablet Commonly known as: NITROSTAT Place 0.4 mg under the tongue every 4 (four) hours as needed for chest pain.   oxyCODONE-acetaminophen 5-325 MG tablet Commonly known as: PERCOCET/ROXICET Take 1 tablet by mouth every 6 (six) hours as needed for moderate pain.   rosuvastatin 40 MG tablet Commonly known as: CRESTOR Take 40 mg by mouth at bedtime.      Activity: activity as tolerated Diet: regular diet Wound Care: keep wound clean and dry  Follow-up with [facility] in 2 weeks.  SignedEmilie Rutter 11/08/2020 4:44 PM

## 2020-11-08 NOTE — Progress Notes (Addendum)
  Progress Note    11/08/2020 7:50 AM 7 Days Post-Op  Subjective:  R heel feeling much better   Vitals:   11/08/20 0400 11/08/20 0746  BP: 124/69 114/70  Pulse: 72 70  Resp: 11 18  Temp: 98 F (36.7 C) (!) 97.5 F (36.4 C)  SpO2: 100% 98%   Physical Exam: Lungs:  Non labored Incisions:  R groin and popliteal incision c/d/i Extremities:  R peroneal and DP by doppler; heel blistering Neurologic: A&O  CBC    Component Value Date/Time   WBC 10.1 11/07/2020 0339   RBC 3.01 (L) 11/07/2020 0339   HGB 7.3 (L) 11/07/2020 0339   HGB 14.2 10/09/2018 1125   HCT 23.7 (L) 11/07/2020 0339   HCT 44.6 10/09/2018 1125   PLT 607 (H) 11/07/2020 0339   PLT 289 10/09/2018 1125   MCV 78.7 (L) 11/07/2020 0339   MCV 79 10/09/2018 1125   MCH 24.3 (L) 11/07/2020 0339   MCHC 30.8 11/07/2020 0339   RDW 17.9 (H) 11/07/2020 0339   RDW 14.8 10/09/2018 1125   LYMPHSABS 1.7 08/27/2020 0056   MONOABS 0.4 08/27/2020 0056   EOSABS 0.4 08/27/2020 0056   BASOSABS 0.0 08/27/2020 0056    BMET    Component Value Date/Time   NA 132 (L) 11/07/2020 0339   NA 135 10/09/2018 1125   K 4.1 11/07/2020 0339   CL 100 11/07/2020 0339   CO2 27 11/07/2020 0339   GLUCOSE 112 (H) 11/07/2020 0339   BUN 11 11/07/2020 0339   BUN 10 10/09/2018 1125   CREATININE 0.76 11/07/2020 0339   CREATININE 1.13 (H) 08/03/2016 1440   CALCIUM 8.9 11/07/2020 0339   GFRNONAA >60 11/07/2020 0339   GFRAA >60 11/24/2019 0620    INR    Component Value Date/Time   INR 1.0 11/18/2019 1844     Intake/Output Summary (Last 24 hours) at 11/08/2020 0750 Last data filed at 11/08/2020 0055 Gross per 24 hour  Intake 480 ml  Output 525 ml  Net -45 ml     Assessment/Plan:  65 y.o. female is s/p redo R femoral to TP trunk bypass with PTFE 7 Days Post-Op   R foot well perfused with brisk peroneal doppler signal Home when placement vs HH is arranged   Emilie Rutter, PA-C [facility] [phone] 11/08/2020 7:50 AM  Agree with above. SNF vs home health Basically disposition issues at this point Continue to work on ambulation  Fabienne Bruns, MD [facility] [place] Office: [phone]

## 2020-11-09 ENCOUNTER — Telehealth: Payer: Self-pay

## 2020-11-09 NOTE — Telephone Encounter (Signed)
Received a VM from PT at [facility] that therapy will be pushed back a day due to conflict with patient schedule.

## 2020-11-10 DIAGNOSIS — R531 Weakness: Secondary | ICD-10-CM | POA: Diagnosis not present

## 2020-11-10 DIAGNOSIS — E785 Hyperlipidemia, unspecified: Secondary | ICD-10-CM | POA: Diagnosis not present

## 2020-11-10 DIAGNOSIS — Z89612 Acquired absence of left leg above knee: Secondary | ICD-10-CM | POA: Diagnosis not present

## 2020-11-10 DIAGNOSIS — Z89411 Acquired absence of right great toe: Secondary | ICD-10-CM | POA: Diagnosis not present

## 2020-11-10 DIAGNOSIS — Z95820 Peripheral vascular angioplasty status with implants and grafts: Secondary | ICD-10-CM | POA: Diagnosis not present

## 2020-11-10 DIAGNOSIS — R262 Difficulty in walking, not elsewhere classified: Secondary | ICD-10-CM | POA: Diagnosis not present

## 2020-11-10 DIAGNOSIS — I10 Essential (primary) hypertension: Secondary | ICD-10-CM | POA: Diagnosis not present

## 2020-11-10 DIAGNOSIS — T82392D Other mechanical complication of femoral arterial graft (bypass), subsequent encounter: Secondary | ICD-10-CM | POA: Diagnosis not present

## 2020-11-15 ENCOUNTER — Other Ambulatory Visit: Payer: Self-pay | Admitting: Occupational Therapy

## 2020-11-15 ENCOUNTER — Other Ambulatory Visit: Payer: Self-pay

## 2020-11-15 ENCOUNTER — Telehealth: Payer: Self-pay

## 2020-11-15 DIAGNOSIS — Z89612 Acquired absence of left leg above knee: Secondary | ICD-10-CM | POA: Diagnosis not present

## 2020-11-15 DIAGNOSIS — Z89411 Acquired absence of right great toe: Secondary | ICD-10-CM | POA: Diagnosis not present

## 2020-11-15 DIAGNOSIS — E785 Hyperlipidemia, unspecified: Secondary | ICD-10-CM | POA: Diagnosis not present

## 2020-11-15 DIAGNOSIS — R531 Weakness: Secondary | ICD-10-CM | POA: Diagnosis not present

## 2020-11-15 DIAGNOSIS — Z95820 Peripheral vascular angioplasty status with implants and grafts: Secondary | ICD-10-CM | POA: Diagnosis not present

## 2020-11-15 DIAGNOSIS — I10 Essential (primary) hypertension: Secondary | ICD-10-CM | POA: Diagnosis not present

## 2020-11-15 DIAGNOSIS — T82392D Other mechanical complication of femoral arterial graft (bypass), subsequent encounter: Secondary | ICD-10-CM | POA: Diagnosis not present

## 2020-11-15 DIAGNOSIS — R262 Difficulty in walking, not elsewhere classified: Secondary | ICD-10-CM | POA: Diagnosis not present

## 2020-11-15 NOTE — Patient Instructions (Signed)
She would like to have a cushion for wheelchair that raises her higher (currently she has a 2 inch cushion with pillow also on top of that)  ACTION PLANNING  Target Problem Area: Hard to reach into sink and backs of counter tops/difficulty holding onto items while in wheelchair  Why Problem May Occur:  Wheelchair height is too short/ can't propel wheelchair and hold items at same time  Target Goal: Increased ability to reach the above surfaces/ having items needed in wheelchair with you   STRATEGIES Modifying your home environment and making it safe: DO: DON'T:  Use new cushion in wheelchair   Use wheelchair accessory bag Put items next to you on seat of wheelchair where they may drop and then there is an increased fall risk with trying to pick them up.            Practice It is important to practice the strategies so we can determine if they will be effective in helping to reach your goal. Follow these specific recommendations: 1. Use new cushion to be able to reach things more easily 2.Use wheelchair accessory bag to keep all items you need while in wheelchair more at hand and for safety (cell phone, home phone) 3. If you get a new wheelchair make sure it has an elevating leg rest for right side, brake extenders, and anti-tippers  If a strategy does not work the first time, try it again and again (and maybe again). We may make some changes over the next few sessions, based on how they work.   Ignacia Palma, OTR/L       11/15/2020

## 2020-11-15 NOTE — Telephone Encounter (Signed)
Pt called to report clear yellowish drainage from an ulcer on her toe and pain that started on Saturday. She is s/p hospital discharge on 3/14 after thrombectomy/bypass revision and endarterectomy. Says that the pain medicine never mitigated her pain at all. She would like to be seen ASAP. Placed on [facility] schedule this week.

## 2020-11-15 NOTE — Telephone Encounter (Signed)
Gave verbal orders to Will from OT. 2 times a week for 1 week and one time a week for one week.

## 2020-11-15 NOTE — Patient Outreach (Signed)
Aging Gracefully Program  OT Follow-Up Visit  11/15/2020  Mercedes [name] 1956-02-23 [id number]  Visit:  2- Second Visit  Start Time:  1605 End Time:  1640 Total Minutes:  35  Readiness to Change Score :  Readiness to Change Score: 10   Durable Medical Equipment: Adaptive Equipment: Other (3" wheelchair cushion, wheelchair accessory bag) Adaptive Equipment Distribution Date: 11/15/20   Goals:  Goals Addressed            This Visit's Progress   . COMPLETED: Patient Stated       She would like to have a cushion for wheelchair that raises her higher (currently she has a 2 inch cushion with pillow also on top of that)  ACTION PLANNING  Target Problem Area: Hard to reach into sink and backs of counter tops/difficulty holding onto items while in wheelchair  Why Problem May Occur:  Wheelchair height is too short/ can't propel wheelchair and hold items at same time  Target Goal: Increased ability to reach the above surfaces/ having items needed in wheelchair with you   STRATEGIES Modifying your home environment and making it safe: DO: DON'T:  Use new cushion in wheelchair   Use wheelchair accessory bag Put items next to you on seat of wheelchair where they may drop and then there is an increased fall risk with trying to pick them up.            Practice It is important to practice the strategies so we can determine if they will be effective in helping to reach your goal. Follow these specific recommendations: 1. Use new cushion to be able to reach things more easily 2.Use wheelchair accessory bag to keep all items you need while in wheelchair more at hand and for safety (cell phone, home phone) 3. If you get a new wheelchair make sure it has an elevating leg rest for right side, brake extenders, and anti-tippers  If a strategy does not work the first time, try it again and again (and maybe again). We may make some changes over the next few sessions, based on how  they work.   Ignacia Palma, OTR/L       11/15/2020       Post Clinical Reasoning: Client Action (Goal) Two Interventions: Provided client with 3" wheelchair cushion Targeted Problem Area Status: A Lot Better Clinician View Of Client Situation:: Mercedes [name] has been the hospital two time since I initialy saw her. She has had two surgeries on her right lower extremity for circulation issues.Currenlty she has had to have her right great toe amputated with her thinking that she may have to have her forefoot amputated. She was able to transfer from her recliner<>wheelchair to try out her new 3" wheelchair cushion.With the added height she as able to reach her faucet at kitchen sink, into sink and back of stove with greater ease. Client View Of His/Her Situation:: Mercedes [name] has been through alot since first meeting her, but she continues to take care of herself with the use of family and an aid intermittently for both. She is very happy with her new ramp and looks forward to her kitchen floor getting fixed. Next Visit Plan:: Looking at tips for aging at home.

## 2020-11-17 DIAGNOSIS — T82392D Other mechanical complication of femoral arterial graft (bypass), subsequent encounter: Secondary | ICD-10-CM | POA: Diagnosis not present

## 2020-11-17 DIAGNOSIS — Z89411 Acquired absence of right great toe: Secondary | ICD-10-CM | POA: Diagnosis not present

## 2020-11-17 DIAGNOSIS — R531 Weakness: Secondary | ICD-10-CM | POA: Diagnosis not present

## 2020-11-17 DIAGNOSIS — R262 Difficulty in walking, not elsewhere classified: Secondary | ICD-10-CM | POA: Diagnosis not present

## 2020-11-17 DIAGNOSIS — E785 Hyperlipidemia, unspecified: Secondary | ICD-10-CM | POA: Diagnosis not present

## 2020-11-17 DIAGNOSIS — Z95820 Peripheral vascular angioplasty status with implants and grafts: Secondary | ICD-10-CM | POA: Diagnosis not present

## 2020-11-17 DIAGNOSIS — Z89612 Acquired absence of left leg above knee: Secondary | ICD-10-CM | POA: Diagnosis not present

## 2020-11-17 DIAGNOSIS — I10 Essential (primary) hypertension: Secondary | ICD-10-CM | POA: Diagnosis not present

## 2020-11-18 ENCOUNTER — Inpatient Hospital Stay (HOSPITAL_COMMUNITY)
Admission: AD | Admit: 2020-11-18 | Discharge: 2020-11-26 | DRG: 240 | Disposition: A | Discharge status: Skilled Nursing Facility | Payer: Medicare Other | Source: Ambulatory Visit | Attending: Vascular Surgery | Admitting: Vascular Surgery

## 2020-11-18 ENCOUNTER — Encounter: Payer: Medicare Other | Admitting: Vascular Surgery

## 2020-11-18 ENCOUNTER — Ambulatory Visit (INDEPENDENT_AMBULATORY_CARE_PROVIDER_SITE_OTHER): Payer: Medicare Other | Admitting: Vascular Surgery

## 2020-11-18 ENCOUNTER — Encounter: Payer: Self-pay | Admitting: Vascular Surgery

## 2020-11-18 ENCOUNTER — Inpatient Hospital Stay (HOSPITAL_COMMUNITY): Payer: Medicare Other

## 2020-11-18 ENCOUNTER — Telehealth: Payer: Self-pay

## 2020-11-18 ENCOUNTER — Other Ambulatory Visit: Payer: Self-pay

## 2020-11-18 ENCOUNTER — Other Ambulatory Visit: Payer: Self-pay | Admitting: Physician Assistant

## 2020-11-18 VITALS — BP 159/133 | HR 65 | Temp 98.3°F | Resp 20 | Ht 65.0 in | Wt 89.0 lb

## 2020-11-18 DIAGNOSIS — M47816 Spondylosis without myelopathy or radiculopathy, lumbar region: Secondary | ICD-10-CM | POA: Diagnosis not present

## 2020-11-18 DIAGNOSIS — Z89612 Acquired absence of left leg above knee: Secondary | ICD-10-CM

## 2020-11-18 DIAGNOSIS — Z89422 Acquired absence of other left toe(s): Secondary | ICD-10-CM

## 2020-11-18 DIAGNOSIS — I998 Other disorder of circulatory system: Secondary | ICD-10-CM | POA: Diagnosis present

## 2020-11-18 DIAGNOSIS — E43 Unspecified severe protein-calorie malnutrition: Secondary | ICD-10-CM | POA: Insufficient documentation

## 2020-11-18 DIAGNOSIS — Z833 Family history of diabetes mellitus: Secondary | ICD-10-CM

## 2020-11-18 DIAGNOSIS — Z825 Family history of asthma and other chronic lower respiratory diseases: Secondary | ICD-10-CM

## 2020-11-18 DIAGNOSIS — Z9889 Other specified postprocedural states: Secondary | ICD-10-CM | POA: Diagnosis not present

## 2020-11-18 DIAGNOSIS — Z950 Presence of cardiac pacemaker: Secondary | ICD-10-CM | POA: Diagnosis not present

## 2020-11-18 DIAGNOSIS — D62 Acute posthemorrhagic anemia: Secondary | ICD-10-CM | POA: Diagnosis not present

## 2020-11-18 DIAGNOSIS — Z20822 Contact with and (suspected) exposure to covid-19: Secondary | ICD-10-CM | POA: Diagnosis present

## 2020-11-18 DIAGNOSIS — I252 Old myocardial infarction: Secondary | ICD-10-CM | POA: Diagnosis not present

## 2020-11-18 DIAGNOSIS — Z89421 Acquired absence of other right toe(s): Secondary | ICD-10-CM | POA: Diagnosis not present

## 2020-11-18 DIAGNOSIS — Z888 Allergy status to other drugs, medicaments and biological substances status: Secondary | ICD-10-CM | POA: Diagnosis not present

## 2020-11-18 DIAGNOSIS — M329 Systemic lupus erythematosus, unspecified: Secondary | ICD-10-CM | POA: Diagnosis present

## 2020-11-18 DIAGNOSIS — M549 Dorsalgia, unspecified: Secondary | ICD-10-CM

## 2020-11-18 DIAGNOSIS — Z7901 Long term (current) use of anticoagulants: Secondary | ICD-10-CM | POA: Diagnosis not present

## 2020-11-18 DIAGNOSIS — I739 Peripheral vascular disease, unspecified: Secondary | ICD-10-CM

## 2020-11-18 DIAGNOSIS — Z8249 Family history of ischemic heart disease and other diseases of the circulatory system: Secondary | ICD-10-CM

## 2020-11-18 DIAGNOSIS — M79606 Pain in leg, unspecified: Secondary | ICD-10-CM | POA: Diagnosis present

## 2020-11-18 DIAGNOSIS — Z87891 Personal history of nicotine dependence: Secondary | ICD-10-CM

## 2020-11-18 DIAGNOSIS — M79604 Pain in right leg: Secondary | ICD-10-CM | POA: Diagnosis not present

## 2020-11-18 DIAGNOSIS — M069 Rheumatoid arthritis, unspecified: Secondary | ICD-10-CM | POA: Diagnosis present

## 2020-11-18 DIAGNOSIS — Z91048 Other nonmedicinal substance allergy status: Secondary | ICD-10-CM | POA: Diagnosis not present

## 2020-11-18 DIAGNOSIS — Z8673 Personal history of transient ischemic attack (TIA), and cerebral infarction without residual deficits: Secondary | ICD-10-CM | POA: Diagnosis not present

## 2020-11-18 DIAGNOSIS — I10 Essential (primary) hypertension: Secondary | ICD-10-CM | POA: Diagnosis present

## 2020-11-18 DIAGNOSIS — Z8261 Family history of arthritis: Secondary | ICD-10-CM

## 2020-11-18 DIAGNOSIS — Z79899 Other long term (current) drug therapy: Secondary | ICD-10-CM

## 2020-11-18 DIAGNOSIS — Z951 Presence of aortocoronary bypass graft: Secondary | ICD-10-CM | POA: Diagnosis not present

## 2020-11-18 DIAGNOSIS — L0390 Cellulitis, unspecified: Secondary | ICD-10-CM | POA: Diagnosis not present

## 2020-11-18 DIAGNOSIS — Q0700 Arnold-Chiari syndrome without spina bifida or hydrocephalus: Secondary | ICD-10-CM

## 2020-11-18 DIAGNOSIS — E785 Hyperlipidemia, unspecified: Secondary | ICD-10-CM | POA: Diagnosis present

## 2020-11-18 DIAGNOSIS — I70221 Atherosclerosis of native arteries of extremities with rest pain, right leg: Principal | ICD-10-CM | POA: Diagnosis present

## 2020-11-18 DIAGNOSIS — I480 Paroxysmal atrial fibrillation: Secondary | ICD-10-CM | POA: Diagnosis present

## 2020-11-18 DIAGNOSIS — I2510 Atherosclerotic heart disease of native coronary artery without angina pectoris: Secondary | ICD-10-CM | POA: Diagnosis not present

## 2020-11-18 DIAGNOSIS — Z7982 Long term (current) use of aspirin: Secondary | ICD-10-CM

## 2020-11-18 DIAGNOSIS — K219 Gastro-esophageal reflux disease without esophagitis: Secondary | ICD-10-CM | POA: Diagnosis not present

## 2020-11-18 DIAGNOSIS — M47817 Spondylosis without myelopathy or radiculopathy, lumbosacral region: Secondary | ICD-10-CM | POA: Diagnosis not present

## 2020-11-18 DIAGNOSIS — M545 Low back pain, unspecified: Secondary | ICD-10-CM | POA: Diagnosis not present

## 2020-11-18 DIAGNOSIS — D75839 Thrombocytosis, unspecified: Secondary | ICD-10-CM | POA: Diagnosis present

## 2020-11-18 LAB — BASIC METABOLIC PANEL
Anion gap: 9 (ref 5–15)
BUN: 13 mg/dL (ref 8–23)
CO2: 23 mmol/L (ref 22–32)
Calcium: 10 mg/dL (ref 8.9–10.3)
Chloride: 101 mmol/L (ref 98–111)
Creatinine, Ser: 0.79 mg/dL (ref 0.44–1.00)
GFR, Estimated: >60 mL/min (ref >60)
Glucose, Bld: 102 mg/dL — ABNORMAL HIGH (ref 70–99)
Potassium: 3.9 mmol/L (ref 3.5–5.1)
Sodium: 133 mmol/L — ABNORMAL LOW (ref 135–145)

## 2020-11-18 LAB — CBC
HCT: 32.6 % — ABNORMAL LOW (ref 36.0–46.0)
Hemoglobin: 9.8 g/dL — ABNORMAL LOW (ref 12.0–15.0)
MCH: 24.3 pg — ABNORMAL LOW (ref 26.0–34.0)
MCHC: 30.1 g/dL (ref 30.0–36.0)
MCV: 80.9 fL (ref 80.0–100.0)
Platelets: 434 10*3/uL — ABNORMAL HIGH (ref 150–400)
RBC: 4.03 MIL/uL (ref 3.87–5.11)
RDW: 19.6 % — ABNORMAL HIGH (ref 11.5–15.5)
WBC: 5.5 10*3/uL (ref 4.0–10.5)
nRBC: 0 % (ref 0.0–0.2)

## 2020-11-18 MED ORDER — ROSUVASTATIN CALCIUM 20 MG PO TABS
40.0000 mg | ORAL_TABLET | Freq: Every day | ORAL | Status: DC
  Administered 2020-11-18 – 2020-11-25 (×8): 40 mg via ORAL
  Filled 2020-11-18 (×8): qty 2

## 2020-11-18 MED ORDER — PANTOPRAZOLE SODIUM 40 MG PO TBEC
40.0000 mg | DELAYED_RELEASE_TABLET | Freq: Every day | ORAL | Status: DC
  Administered 2020-11-18 – 2020-11-26 (×9): 40 mg via ORAL
  Filled 2020-11-18 (×9): qty 1

## 2020-11-18 MED ORDER — ENSURE MAX PROTEIN PO LIQD
330.0000 mL | Freq: Every day | ORAL | Status: DC
  Filled 2020-11-18: qty 330

## 2020-11-18 MED ORDER — METOPROLOL TARTRATE 5 MG/5ML IV SOLN: 2.0000 mg | INTRAVENOUS | Status: DC | PRN

## 2020-11-18 MED ORDER — GABAPENTIN 300 MG PO CAPS
300.0000 mg | ORAL_CAPSULE | Freq: Two times a day (BID) | ORAL | Status: DC
  Administered 2020-11-18 – 2020-11-26 (×16): 300 mg via ORAL
  Filled 2020-11-18 (×16): qty 1

## 2020-11-18 MED ORDER — METOPROLOL TARTRATE 12.5 MG HALF TABLET
12.5000 mg | ORAL_TABLET | Freq: Two times a day (BID) | ORAL | Status: DC
  Administered 2020-11-18 – 2020-11-26 (×15): 12.5 mg via ORAL
  Filled 2020-11-18 (×16): qty 1

## 2020-11-18 MED ORDER — APIXABAN 5 MG PO TABS
5.0000 mg | ORAL_TABLET | Freq: Two times a day (BID) | ORAL | Status: DC
  Administered 2020-11-18 – 2020-11-19 (×2): 5 mg via ORAL
  Filled 2020-11-18 (×2): qty 1

## 2020-11-18 MED ORDER — OXYCODONE-ACETAMINOPHEN 5-325 MG PO TABS
1.0000 | ORAL_TABLET | ORAL | Status: DC | PRN
  Administered 2020-11-18 – 2020-11-21 (×10): 2 via ORAL
  Administered 2020-11-21: 1 via ORAL
  Administered 2020-11-21 – 2020-11-23 (×5): 2 via ORAL
  Filled 2020-11-18 (×10): qty 2
  Filled 2020-11-18: qty 1
  Filled 2020-11-18 (×5): qty 2

## 2020-11-18 MED ORDER — GUAIFENESIN-DM 100-10 MG/5ML PO SYRP: 15.0000 mL | ORAL_SOLUTION | ORAL | Status: DC | PRN

## 2020-11-18 MED ORDER — EZETIMIBE 10 MG PO TABS
10.0000 mg | ORAL_TABLET | Freq: Every day | ORAL | Status: DC
  Administered 2020-11-19 – 2020-11-26 (×8): 10 mg via ORAL
  Filled 2020-11-18 (×8): qty 1

## 2020-11-18 MED ORDER — ALUM & MAG HYDROXIDE-SIMETH 200-200-20 MG/5ML PO SUSP: 15.0000 mL | ORAL | Status: DC | PRN

## 2020-11-18 MED ORDER — ACETAMINOPHEN 325 MG PO TABS
325.0000 mg | ORAL_TABLET | ORAL | Status: DC | PRN
  Administered 2020-11-18: 650 mg via ORAL
  Filled 2020-11-18 (×2): qty 2

## 2020-11-18 MED ORDER — HYDRALAZINE HCL 20 MG/ML IJ SOLN
5.0000 mg | INTRAMUSCULAR | Status: DC | PRN
  Administered 2020-11-23: 5 mg via INTRAVENOUS
  Filled 2020-11-18: qty 1

## 2020-11-18 MED ORDER — LABETALOL HCL 5 MG/ML IV SOLN
10.0000 mg | INTRAVENOUS | Status: DC | PRN
  Filled 2020-11-18: qty 4

## 2020-11-18 MED ORDER — HYDROMORPHONE HCL 1 MG/ML IJ SOLN
0.5000 mg | INTRAMUSCULAR | Status: DC | PRN
  Administered 2020-11-19 – 2020-11-22 (×4): 1 mg via INTRAVENOUS
  Filled 2020-11-18 (×4): qty 1

## 2020-11-18 MED ORDER — POTASSIUM CHLORIDE CRYS ER 20 MEQ PO TBCR: 20.0000 meq | EXTENDED_RELEASE_TABLET | Freq: Once | ORAL | Status: DC

## 2020-11-18 MED ORDER — ONDANSETRON HCL 4 MG/2ML IJ SOLN: 4.0000 mg | Freq: Four times a day (QID) | INTRAMUSCULAR | Status: DC | PRN

## 2020-11-18 MED ORDER — ASPIRIN EC 81 MG PO TBEC
81.0000 mg | DELAYED_RELEASE_TABLET | Freq: Every day | ORAL | Status: DC
  Administered 2020-11-19 – 2020-11-26 (×8): 81 mg via ORAL
  Filled 2020-11-18 (×8): qty 1

## 2020-11-18 MED ORDER — ACETAMINOPHEN 650 MG RE SUPP
325.0000 mg | RECTAL | Status: DC | PRN
  Filled 2020-11-18: qty 1

## 2020-11-18 MED ORDER — SODIUM CHLORIDE 0.9 % IV SOLN: INTRAVENOUS | Status: DC

## 2020-11-18 MED ORDER — PHENOL 1.4 % MT LIQD: 1.0000 | OROMUCOSAL | Status: DC | PRN

## 2020-11-18 NOTE — Progress Notes (Addendum)
Patient name: Mercedes [name] MRN: [id number] DOB: [birth year] Sex: female  HPI: Mercedes [name] is a 65 y.o. female, status post thrombectomy and revision of right femoral to below-knee popliteal bypass on November 02, 2020.  The graft was revised and extended down to the tibioperoneal trunk with an interposition 6 mm PTFE.  She was discharged to home 10 days ago.  A few days ago she began to start experiencing pain across her lower back extending across the left and right sides.  She also has some pain in her left above-knee amputation.  She also has pain extending from the right hip all the way down into the right foot.  She does not recall any trauma event.  She states she has been getting out of bed in the wheelchair every day.  The pain is fairly continuous.  It does not seem to be relieved by any certain position.  Other medical problems include hypertension hyperlipidemia and arthritis.  She also has a history of coronary artery disease.  These are all currently stable.  She is on aspirin and Eliquis.  Past Medical History:  Diagnosis Date  . Abnormal LFTs    a. in the past when on Lipitor  . Anemia    as a young woman  . Anxiety   . Arnold-Chiari malformation, type I (HCC)    MRI brain 02/2010  . CAD (coronary artery disease)    a. NSTEMI 07/2009: LHC - D2 40%, LAD irreg., EF 50% with apical AK (tako-tsubo CM);  b. Inf STEMI (04/2013): Tx Promus DES to CFX;  c. 08/2013 Lexi CL: No ischemia, dist ant/ap/inf-ap infarct, EF  d. 06/2016 NSTEMI with LM disease: s/p CABG on 07/17/2016 w/ LIMA-LAD, SVG-OM, and SVG-dRCA.  [name] DEPRESSION   . Dizziness    ? CVA 01/2010 - carotid dopplers with no ICA stenosis  . GERD (gastroesophageal reflux disease)   . Headache   . 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 HYPERLIPIDEMIA   . HYPERTENSION   . MI (myocardial infarction) (HCC)   . PAD (peripheral artery disease) (HCC)    s/p L fem pop 11/2013 in setting of 1st toe  osteo/gangrene  . Paroxysmal atrial fibrillation (HCC)    a. anticoagulated with Eliquis 10/2014  . 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 RA (rheumatoid arthritis) (HCC)    prior tx by Dr. Dareen Piano  . Sjogren's syndrome (HCC) 06/02/13   pt denies this (12/01/13)  . SLE (systemic lupus erythematosus) (HCC) dx 05/2013   follows with rheum Dareen Piano)  . Tachy-brady syndrome (HCC)    a. s/p STJ Leadless pacemaker 11-04-2014 Dr Johney Frame  . 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 Wears dentures   . Wears glasses    Past Surgical History:  Procedure Laterality Date  . ABDOMINAL AORTAGRAM N/A 11/24/2013   Procedure: ABDOMINAL AORTAGRAM WITH BILATERAL RUNOFF WITH POSSIBLE INTERVENTION;  Surgeon: Chuck Hint, MD;  Location: [facility] CATH LAB;  Service: Cardiovascular;  Laterality: N/A;  . ABDOMINAL AORTOGRAM W/LOWER EXTREMITY N/A 04/25/2019   Procedure: ABDOMINAL AORTOGRAM W/LOWER EXTREMITY;  Surgeon: Sherren Kerns, MD;  Location: [facility] INVASIVE CV LAB;  Service: Cardiovascular;  Laterality: N/A;  . ABDOMINAL AORTOGRAM W/LOWER EXTREMITY Bilateral 05/15/2019   Procedure: ABDOMINAL AORTOGRAM W/LOWER EXTREMITY;  Surgeon: Nada Libman, MD;  Location: [facility] INVASIVE CV LAB;  Service: Cardiovascular;  Laterality: Bilateral;  . ABDOMINAL AORTOGRAM W/LOWER EXTREMITY N/A 08/23/2020   Procedure: ABDOMINAL AORTOGRAM W/LOWER EXTREMITY;  Surgeon: Maeola Harman, MD;  Location: [facility] INVASIVE CV LAB;  Service: Cardiovascular;  Laterality: N/A;  . ABDOMINAL AORTOGRAM W/LOWER EXTREMITY N/A 10/29/2020   Procedure: ABDOMINAL AORTOGRAM W/LOWER EXTREMITY;  Surgeon: Sherren Kerns, MD;  Location: [facility] INVASIVE CV LAB;  Service: Cardiovascular;  Laterality: N/A;  . AMPUTATION Left 12/02/2013   Procedure: LEFT 1ST TOE AMPUTATION;  Surgeon: Sherren Kerns, MD;  Location: [facility] OR;  Service: Vascular;   Laterality: Left;  . AMPUTATION Left 07/16/2019   Procedure: LEFT TRANSMETATARSAL AMPUTATION;  Surgeon: Nadara Mustard, MD;  Location: [facility] OR;  Service: Orthopedics;  Laterality: Left;  . AMPUTATION Left 10/01/2019   Procedure: LEFT AMPUTATION ABOVE KNEE;  Surgeon: Sherren Kerns, MD;  Location: [facility] OR;  Service: Vascular;  Laterality: Left;  . AMPUTATION Right 08/31/2020   Procedure: Right first toe amputation;  Surgeon: Sherren Kerns, MD;  Location: [facility] OR;  Service: Vascular;  Laterality: Right;  . APPLICATION OF WOUND VAC Left 11/03/2019   Procedure: Application Of Wound Vac, left lower extremity;  Surgeon: Sherren Kerns, MD;  Location: [facility] OR;  Service: Vascular;  Laterality: Left;  . CARDIAC CATHETERIZATION N/A 07/12/2016   Procedure: Left Heart Cath and Coronary Angiography;  Surgeon: Lennette Bihari, MD;  Location: [facility] INVASIVE CV LAB;  Service: Cardiovascular;  Laterality: N/A;  . CARDIAC CATHETERIZATION N/A 07/14/2016   Procedure: Intravascular Ultrasound/IVUS;  Surgeon: Yvonne Kendall, MD;  Location: [facility] INVASIVE CV LAB;  Service: Cardiovascular;  Laterality: N/A;  . COLONOSCOPY    . CORONARY ANGIOPLASTY  04/2013  . CORONARY ARTERY BYPASS GRAFT N/A 07/17/2016   Procedure: CORONARY ARTERY BYPASS GRAFTING (CABG)x3 using right greater saphenous vein harvested endoscopiclly and left internal mammary artery;  Surgeon: Kerin Perna, MD;  Location: [facility] OR;  Service: Open Heart Surgery;  Laterality: N/A;  . ENDARTERECTOMY FEMORAL Left 05/12/2019   Procedure: LEFT Femoral Endarterectomy;  Surgeon: Sherren Kerns, MD;  Location: [facility] OR;  Service: Vascular;  Laterality: Left;  . FEMORAL-POPLITEAL BYPASS GRAFT Left 12/02/2013   Procedure:  LEFT FEMORAL-BELOW KNEE POPLITEAL ARTERY BYPASS GRAFT;  Surgeon: Sherren Kerns, MD;  Location: [facility] OR;  Service: Vascular;  Laterality: Left;  . FEMORAL-POPLITEAL BYPASS GRAFT Left 09/25/2019   Procedure: THROMBECTOMY and PROPATEN BYPASS  FEMORAL TO BELOW KNEE  POPLITEAL ARTERY BYPASS GRAFT LEFT LEG;  Surgeon: Larina Earthly, MD;  Location: [facility] OR;  Service: Vascular;  Laterality: Left;  . FEMORAL-POPLITEAL BYPASS GRAFT Right 08/24/2020   Procedure: RIGHT FEMORAL-BELOW KNEE POPLITEAL ARTERY BYPASS USING GORE PROPATEN GRAFT;  Surgeon: Maeola Harman, MD;  Location: [facility] OR;  Service: Vascular;  Laterality: Right;  . FEMORAL-POPLITEAL BYPASS GRAFT Right 11/01/2020   Procedure: Thrombectomy and Revision of Right Femoral to below knee Bypass with Interpostional graft to Tibial / peroneal Trunk and Endarterectomy Tibial Weston Settle Trunk.;  Surgeon: Sherren Kerns, MD;  Location: [facility] OR;  Service: Vascular;  Laterality: Right;  . INTRAOPERATIVE ARTERIOGRAM Right 11/01/2020   Procedure: ANTEGRADE RIGHT LOWER EXTREMITY INTRA OPERATIVE ARTERIOGRAM;  Surgeon: Sherren Kerns, MD;  Location: [facility] OR;  Service: Vascular;  Laterality: Right;  . LEFT HEART CATHETERIZATION WITH CORONARY ANGIOGRAM N/A 05/19/2013   Procedure: LEFT HEART CATHETERIZATION WITH CORONARY ANGIOGRAM;  Surgeon: Micheline Chapman, MD;  Location: [facility] CATH LAB;  Service: Cardiovascular;  Laterality: N/A;  . LEFT HEART CATHETERIZATION WITH CORONARY ANGIOGRAM N/A 10/28/2014  Procedure: LEFT HEART CATHETERIZATION WITH CORONARY ANGIOGRAM;  Surgeon: Iran Ouch, MD;  Location: [facility] CATH LAB;  Service: Cardiovascular;  Laterality: N/A;  . LOWER EXTREMITY ANGIOGRAM Left 05/12/2019   Procedure: Lower Extremity Angiogram;  Surgeon: Sherren Kerns, MD;  Location: [facility] OR;  Service: Vascular;  Laterality: Left;  . PATCH ANGIOPLASTY Left 05/12/2019   Procedure: Left Femoral Patch Angioplasty USING CADAVERIC SAPHENOUS VEIN;  Surgeon: Sherren Kerns, MD;  Location: [facility] OR;  Service: Vascular;  Laterality: Left;  . PERCUTANEOUS CORONARY STENT INTERVENTION (PCI-S)  05/19/2013   Procedure: PERCUTANEOUS CORONARY STENT INTERVENTION (PCI-S);  Surgeon: Micheline Chapman, MD;  Location: [facility] CATH LAB;  Service:  Cardiovascular;;  . PERIPHERAL VASCULAR INTERVENTION Right 10/29/2020   Procedure: PERIPHERAL VASCULAR INTERVENTION;  Surgeon: Sherren Kerns, MD;  Location: [facility] INVASIVE CV LAB;  Service: Cardiovascular;  Laterality: Right;  common iliac  . PERMANENT PACEMAKER INSERTION N/A 11/04/2014   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  . TEE WITHOUT CARDIOVERSION N/A 07/17/2016   Procedure: TRANSESOPHAGEAL ECHOCARDIOGRAM (TEE);  Surgeon: Kerin Perna, MD;  Location: [facility] OR;  Service: Open Heart Surgery;  Laterality: N/A;  . THROMBECTOMY FEMORAL ARTERY Left 05/12/2019   Procedure: Left Ilio-Femoral Artery and Femoral-popliteal Graft Thrombectomy;  Surgeon: Sherren Kerns, MD;  Location: [facility] OR;  Service: Vascular;  Laterality: Left;  . THROMBECTOMY OF BYPASS GRAFT FEMORAL- POPLITEAL ARTERY Right 11/01/2020   Procedure: POSSIBLE THROMBOLYSIS VERSUS OPEN THROMBECTOMY AND REVSION OF RIGHT FEMORAL-POPLITEAL ARTERY BYPASS;  Surgeon: Sherren Kerns, MD;  Location: [facility] OR;  Service: Vascular;  Laterality: Right;  . TUBAL LIGATION    . VISCERAL ANGIOGRAPHY N/A 04/25/2019   Procedure: MESENTRIC  ANGIOGRAPHY;  Surgeon: Sherren Kerns, MD;  Location: [facility] INVASIVE CV LAB;  Service: Cardiovascular;  Laterality: N/A;  . WOUND DEBRIDEMENT Left 11/03/2019   Procedure: Debridement Wound of left above the knee amputation;  Surgeon: Sherren Kerns, MD;  Location: [facility] OR;  Service: Vascular;  Laterality: Left;    Family History  Problem Relation Age of Onset  . Arthritis Mother   . Emphysema Mother   . Arthritis Father   . COPD Father   . Heart disease Father   . Diabetes Paternal Aunt        paternal Aunts  . Heart disease Paternal Aunt   . Thyroid disease Neg Hx     SOCIAL HISTORY: Social History   Socioeconomic History  . Marital status: Single    Spouse name: Not on file  . Number of children: 3  . Years of education: Not on file  . Highest education level: Not on file   Occupational History  . Occupation: retired  Tobacco Use  . Smoking status: Former Smoker    Types: Cigarettes    Quit date: 2019    Years since quitting: 3.2  . Smokeless tobacco: Never Used  . Tobacco comment:    Vaping Use  . Vaping Use: Never used  Substance and Sexual Activity  . Alcohol use: No    Alcohol/week: 0.0 standard drinks  . Drug use: Not Currently    Types: Marijuana, Other-see comments    Comment:  and CBD oil  last use of both was 10/29/19  . Sexual activity: Not on file  Other Topics Concern  . Not on file  Social History Narrative   Lives alone.   Social Determinants of Health   Financial Resource Strain: Low Risk   . Difficulty of Paying  Living Expenses: Not hard at all  Food Insecurity: No Food Insecurity  . Worried About Programme researcher, broadcasting/film/video in the Last Year: Never true  . Ran Out of Food in the Last Year: Never true  Transportation Needs: No Transportation Needs  . Lack of Transportation (Medical): No  . Lack of Transportation (Non-Medical): No  Physical Activity: Inactive  . Days of Exercise per Week: 0 days  . Minutes of Exercise per Session: 0 min  Stress: No Stress Concern Present  . Feeling of Stress : Not at all  Social Connections: Unknown  . Frequency of Communication with Friends and Family: More than three times a week  . Frequency of Social Gatherings with Friends and Family: Once a week  . Attends Religious Services: Patient refused  . Active Member of Clubs or Organizations: Patient refused  . Attends Banker Meetings: Patient refused  . Marital Status: Never married  Intimate Partner Violence: Not At Risk  . Fear of Current or Ex-Partner: No  . Emotionally Abused: No  . Physically Abused: No  . Sexually Abused: No    Allergies  Allergen Reactions  . Brilinta [Ticagrelor] Anaphylaxis, Shortness Of Breath and Other (See Comments)    Also, chest tightness  . Latex Rash  . Tape Rash    Current Outpatient  Medications  Medication Sig Dispense Refill  . acetaminophen (TYLENOL) 500 MG tablet Take 500-1,000 mg by mouth every 8 (eight) hours as needed for mild pain or headache.    [name] aspirin EC 81 MG EC tablet Take 1 tablet (81 mg total) by mouth daily at 6 (six) AM. Swallow whole.    [name] ELIQUIS 5 MG TABS tablet TAKE 1 TABLET BY MOUTH TWICE DAILY (Patient taking differently: Take 5 mg by mouth 2 (two) times daily.) 180 tablet 1  . Ensure Max Protein (ENSURE MAX PROTEIN) LIQD Take 8 oz by mouth daily.    [name] ezetimibe (ZETIA) 10 MG tablet Take 1 tablet (10 mg total) by mouth daily. 90 tablet 3  . ferrous sulfate 325 (65 FE) MG tablet Take 1 tablet (325 mg total) by mouth 3 (three) times daily with meals. 90 tablet 1  . gabapentin (NEURONTIN) 300 MG capsule Take 1 capsule (300 mg total) by mouth 2 (two) times daily. 60 capsule 3  . metoprolol tartrate (LOPRESSOR) 25 MG tablet Take 0.5 tablets (12.5 mg total) by mouth 2 (two) times daily. 180 tablet 3  . Multiple Vitamin (MULTIVITAMIN WITH MINERALS) TABS tablet Take 1 tablet by mouth daily.    . nitroGLYCERIN (NITROSTAT) 0.4 MG SL tablet Place 0.4 mg under the tongue every 4 (four) hours as needed for chest pain.    . rosuvastatin (CRESTOR) 40 MG tablet Take 40 mg by mouth at bedtime.    [name] oxyCODONE-acetaminophen (PERCOCET/ROXICET) 5-325 MG tablet Take 1 tablet by mouth every 6 (six) hours as needed for moderate pain. (Patient not taking: Reported on 11/18/2020) 20 tablet 0   No current facility-administered medications for this visit.    ROS:   General:  No weight loss, Fever, chills  HEENT: No recent headaches, no nasal bleeding, no visual changes, no sore throat  Neurologic: No dizziness, blackouts, seizures. No recent symptoms of stroke or mini- stroke. No recent episodes of slurred speech, or temporary blindness.  Cardiac: No recent episodes of chest pain/pressure, no shortness of breath at rest.  No shortness of breath with exertion.  Denies  history of atrial fibrillation or irregular heartbeat  Vascular:  No history of rest pain in feet.  No history of claudication.  No history of non-healing ulcer, No history of DVT   Pulmonary: No home oxygen, no productive cough, no hemoptysis,  No asthma or wheezing  Musculoskeletal:  [X]  Arthritis, [X]  Low back pain,  [X]  Joint pain  Hematologic:No history of hypercoagulable state.  No history of easy bleeding.  No history of anemia  Gastrointestinal: No hematochezia or melena,  No gastroesophageal reflux, no trouble swallowing  Urinary: [ ]  chronic Kidney disease, [ ]  on HD - [ ]  MWF or [ ]  TTHS, [ ]  Burning with urination, [ ]  Frequent urination, [ ]  Difficulty urinating;   Skin: No rashes  Psychological: No history of anxiety,  No history of depression   Physical Examination  Vitals:   11/18/20 1441  BP: (!) 159/133  Pulse: 65  Resp: 20  Temp: 98.3 F (36.8 C)  SpO2: 96%  Weight: 89 lb (40.4 kg)  Height: 5\' 5"  (1.651 m)    Body mass index is 14.81 kg/m.  General:  Alert and oriented, seems to be in pain moving around frequently HEENT: Normal Neck: No JVD Pulmonary: Clear to auscultation bilaterally Cardiac: Regular Rate and Rhythm Abdomen: Soft, non-tender, non-distended Skin: No rash Extremity Pulses:  2+ radial, brachial, unable to palpate femoral pulses secondary to pain right groin incision healing no erythema no drainage femoral, absent right dorsalis pedis, posterior tibial pulses she does have a monophasic peroneal and posterior tibial Doppler signal in the right foot.  She has multiple blisters on the right plantar aspect of the foot and early gangrenous changes of the right fifth toe.  These are all similar to at the time of discharge.   Musculoskeletal: No deformity or edema, well-healed left above-knee amputation Neurologic: Upper and lower extremity motor 5/5 and symmetric  ASSESSMENT: Back hip lower extremity pain of unknown etiology.  She does have  some Doppler flow to her right foot although it is monophasic.  I am not sure if her graft is occluded but most likely it is patent since she does have some Doppler flow.  I am not really sure what is causing all of her pain.   PLAN: We will admit to [facility] for further evaluation of her right leg bypass as well as further work-up for her pain.  Plan will be to place her on IV pain medication while her work-up is in progress.  She is not a candidate for further revascularizations of her bypass.  However, I do not believe her constellations of symptoms are all completely related to this.  We will continue her Eliquis for now.  We will get a graft duplex of her right leg bypass as well as a right hip x-ray and lumbar spine x-ray   , MD [facility] Office: [phone]

## 2020-11-18 NOTE — Telephone Encounter (Signed)
-----   Message from Pincus Sanes, MD sent at 11/06/2020  2:16 PM EST ----- Regarding: FW: follow up on patient's CAPS paperwork Did we do this - I can't remember  - it would have been in the past couple of weeks...Marland KitchenMarland KitchenMarland Kitchen  Sb ----- Message ----- From: Candida Peeling, RN Sent: 11/04/2020   3:58 PM EST To: Pincus Sanes, MD Subject: follow up on patient's CAPS paperwork          Good afternoon,  I currently have this patient in house here at [facility]. I was speaking with her at bedside and she was telling me she was trying to apply for CAPS aide. She shared with me she has been having trouble with the paperwork and stated she had faxed the paperwork to your office for you to follow up on.  I just wanted to reach out to you to make sure you have received this paperwork to complete on her behalf so that it can be sent on to [facility] to process. Thank you in advance for your follow up and assistance in this matter, please let me know if there is anything I can do to assist from my end.   Donn Pierini [facility]

## 2020-11-18 NOTE — Telephone Encounter (Signed)
Paperwork faxed on 11/09/20 to [phone].   Fax conformation received.

## 2020-11-18 NOTE — H&P (View-Only) (Signed)
Patient name: Mercedes [name] MRN: [id number] DOB: [birth year] Sex: female  HPI: Mercedes [name] is a 65 y.o. female, status post thrombectomy and revision of right femoral to below-knee popliteal bypass on November 02, 2020.  The graft was revised and extended down to the tibioperoneal trunk with an interposition 6 mm PTFE.  She was discharged to home 10 days ago.  A few days ago she began to start experiencing pain across her lower back extending across the left and right sides.  She also has some pain in her left above-knee amputation.  She also has pain extending from the right hip all the way down into the right foot.  She does not recall any trauma event.  She states she has been getting out of bed in the wheelchair every day.  The pain is fairly continuous.  It does not seem to be relieved by any certain position.  Other medical problems include hypertension hyperlipidemia and arthritis.  She also has a history of coronary artery disease.  These are all currently stable.  She is on aspirin and Eliquis.  Past Medical History:  Diagnosis Date  . Abnormal LFTs    a. in the past when on Lipitor  . Anemia    as a young woman  . Anxiety   . Arnold-Chiari malformation, type I (HCC)    MRI brain 02/2010  . CAD (coronary artery disease)    a. NSTEMI 07/2009: LHC - D2 40%, LAD irreg., EF 50% with apical AK (tako-tsubo CM);  b. Inf STEMI (04/2013): Tx Promus DES to CFX;  c. 08/2013 Lexi CL: No ischemia, dist ant/ap/inf-ap infarct, EF  d. 06/2016 NSTEMI with LM disease: s/p CABG on 07/17/2016 w/ LIMA-LAD, SVG-OM, and SVG-dRCA.  [name] DEPRESSION   . Dizziness    ? CVA 01/2010 - carotid dopplers with no ICA stenosis  . GERD (gastroesophageal reflux disease)   . Headache   . 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 HYPERLIPIDEMIA   . HYPERTENSION   . MI (myocardial infarction) (HCC)   . PAD (peripheral artery disease) (HCC)    s/p L fem pop 11/2013 in setting of 1st toe  osteo/gangrene  . Paroxysmal atrial fibrillation (HCC)    a. anticoagulated with Eliquis 10/2014  . 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 RA (rheumatoid arthritis) (HCC)    prior tx by Dr. Dareen Piano  . Sjogren's syndrome (HCC) 06/02/13   pt denies this (12/01/13)  . SLE (systemic lupus erythematosus) (HCC) dx 05/2013   follows with rheum Dareen Piano)  . Tachy-brady syndrome (HCC)    a. s/p STJ Leadless pacemaker 11-04-2014 Dr Johney Frame  . 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 Wears dentures   . Wears glasses    Past Surgical History:  Procedure Laterality Date  . ABDOMINAL AORTAGRAM N/A 11/24/2013   Procedure: ABDOMINAL AORTAGRAM WITH BILATERAL RUNOFF WITH POSSIBLE INTERVENTION;  Surgeon: Chuck Hint, MD;  Location: [facility] CATH LAB;  Service: Cardiovascular;  Laterality: N/A;  . ABDOMINAL AORTOGRAM W/LOWER EXTREMITY N/A 04/25/2019   Procedure: ABDOMINAL AORTOGRAM W/LOWER EXTREMITY;  Surgeon: Sherren Kerns, MD;  Location: [facility] INVASIVE CV LAB;  Service: Cardiovascular;  Laterality: N/A;  . ABDOMINAL AORTOGRAM W/LOWER EXTREMITY Bilateral 05/15/2019   Procedure: ABDOMINAL AORTOGRAM W/LOWER EXTREMITY;  Surgeon: Nada Libman, MD;  Location: [facility] INVASIVE CV LAB;  Service: Cardiovascular;  Laterality: Bilateral;  . ABDOMINAL AORTOGRAM W/LOWER EXTREMITY N/A 08/23/2020   Procedure: ABDOMINAL AORTOGRAM W/LOWER EXTREMITY;  Surgeon: Maeola Harman, MD;  Location: [facility] INVASIVE CV LAB;  Service: Cardiovascular;  Laterality: N/A;  . ABDOMINAL AORTOGRAM W/LOWER EXTREMITY N/A 10/29/2020   Procedure: ABDOMINAL AORTOGRAM W/LOWER EXTREMITY;  Surgeon: Sherren Kerns, MD;  Location: [facility] INVASIVE CV LAB;  Service: Cardiovascular;  Laterality: N/A;  . AMPUTATION Left 12/02/2013   Procedure: LEFT 1ST TOE AMPUTATION;  Surgeon: Sherren Kerns, MD;  Location: [facility] OR;  Service: Vascular;   Laterality: Left;  . AMPUTATION Left 07/16/2019   Procedure: LEFT TRANSMETATARSAL AMPUTATION;  Surgeon: Nadara Mustard, MD;  Location: [facility] OR;  Service: Orthopedics;  Laterality: Left;  . AMPUTATION Left 10/01/2019   Procedure: LEFT AMPUTATION ABOVE KNEE;  Surgeon: Sherren Kerns, MD;  Location: [facility] OR;  Service: Vascular;  Laterality: Left;  . AMPUTATION Right 08/31/2020   Procedure: Right first toe amputation;  Surgeon: Sherren Kerns, MD;  Location: [facility] OR;  Service: Vascular;  Laterality: Right;  . APPLICATION OF WOUND VAC Left 11/03/2019   Procedure: Application Of Wound Vac, left lower extremity;  Surgeon: Sherren Kerns, MD;  Location: [facility] OR;  Service: Vascular;  Laterality: Left;  . CARDIAC CATHETERIZATION N/A 07/12/2016   Procedure: Left Heart Cath and Coronary Angiography;  Surgeon: Lennette Bihari, MD;  Location: [facility] INVASIVE CV LAB;  Service: Cardiovascular;  Laterality: N/A;  . CARDIAC CATHETERIZATION N/A 07/14/2016   Procedure: Intravascular Ultrasound/IVUS;  Surgeon: Yvonne Kendall, MD;  Location: [facility] INVASIVE CV LAB;  Service: Cardiovascular;  Laterality: N/A;  . COLONOSCOPY    . CORONARY ANGIOPLASTY  04/2013  . CORONARY ARTERY BYPASS GRAFT N/A 07/17/2016   Procedure: CORONARY ARTERY BYPASS GRAFTING (CABG)x3 using right greater saphenous vein harvested endoscopiclly and left internal mammary artery;  Surgeon: Kerin Perna, MD;  Location: [facility] OR;  Service: Open Heart Surgery;  Laterality: N/A;  . ENDARTERECTOMY FEMORAL Left 05/12/2019   Procedure: LEFT Femoral Endarterectomy;  Surgeon: Sherren Kerns, MD;  Location: [facility] OR;  Service: Vascular;  Laterality: Left;  . FEMORAL-POPLITEAL BYPASS GRAFT Left 12/02/2013   Procedure:  LEFT FEMORAL-BELOW KNEE POPLITEAL ARTERY BYPASS GRAFT;  Surgeon: Sherren Kerns, MD;  Location: [facility] OR;  Service: Vascular;  Laterality: Left;  . FEMORAL-POPLITEAL BYPASS GRAFT Left 09/25/2019   Procedure: THROMBECTOMY and PROPATEN BYPASS  FEMORAL TO BELOW KNEE  POPLITEAL ARTERY BYPASS GRAFT LEFT LEG;  Surgeon: Larina Earthly, MD;  Location: [facility] OR;  Service: Vascular;  Laterality: Left;  . FEMORAL-POPLITEAL BYPASS GRAFT Right 08/24/2020   Procedure: RIGHT FEMORAL-BELOW KNEE POPLITEAL ARTERY BYPASS USING GORE PROPATEN GRAFT;  Surgeon: Maeola Harman, MD;  Location: [facility] OR;  Service: Vascular;  Laterality: Right;  . FEMORAL-POPLITEAL BYPASS GRAFT Right 11/01/2020   Procedure: Thrombectomy and Revision of Right Femoral to below knee Bypass with Interpostional graft to Tibial / peroneal Trunk and Endarterectomy Tibial Weston Settle Trunk.;  Surgeon: Sherren Kerns, MD;  Location: [facility] OR;  Service: Vascular;  Laterality: Right;  . INTRAOPERATIVE ARTERIOGRAM Right 11/01/2020   Procedure: ANTEGRADE RIGHT LOWER EXTREMITY INTRA OPERATIVE ARTERIOGRAM;  Surgeon: Sherren Kerns, MD;  Location: [facility] OR;  Service: Vascular;  Laterality: Right;  . LEFT HEART CATHETERIZATION WITH CORONARY ANGIOGRAM N/A 05/19/2013   Procedure: LEFT HEART CATHETERIZATION WITH CORONARY ANGIOGRAM;  Surgeon: Micheline Chapman, MD;  Location: [facility] CATH LAB;  Service: Cardiovascular;  Laterality: N/A;  . LEFT HEART CATHETERIZATION WITH CORONARY ANGIOGRAM N/A 10/28/2014  Procedure: LEFT HEART CATHETERIZATION WITH CORONARY ANGIOGRAM;  Surgeon: Muhammad A Arida, MD;  Location: [facility] CATH LAB;  Service: Cardiovascular;  Laterality: N/A;  . LOWER EXTREMITY ANGIOGRAM Left 05/12/2019   Procedure: Lower Extremity Angiogram;  Surgeon: Monseratt Ledin E, MD;  Location: [facility] OR;  Service: Vascular;  Laterality: Left;  . PATCH ANGIOPLASTY Left 05/12/2019   Procedure: Left Femoral Patch Angioplasty USING CADAVERIC SAPHENOUS VEIN;  Surgeon: Jeanifer Halliday E, MD;  Location: [facility] OR;  Service: Vascular;  Laterality: Left;  . PERCUTANEOUS CORONARY STENT INTERVENTION (PCI-S)  05/19/2013   Procedure: PERCUTANEOUS CORONARY STENT INTERVENTION (PCI-S);  Surgeon: Michael D Cooper, MD;  Location: [facility] CATH LAB;  Service:  Cardiovascular;;  . PERIPHERAL VASCULAR INTERVENTION Right 10/29/2020   Procedure: PERIPHERAL VASCULAR INTERVENTION;  Surgeon: Sukanya Goldblatt E, MD;  Location: [facility] INVASIVE CV LAB;  Service: Cardiovascular;  Laterality: Right;  common iliac  . PERMANENT PACEMAKER INSERTION N/A 11/04/2014   STJ Leadless pacemaker implanted by Dr Allred for tachy/brady  . TEE WITHOUT CARDIOVERSION N/A 07/17/2016   Procedure: TRANSESOPHAGEAL ECHOCARDIOGRAM (TEE);  Surgeon: Peter Van Trigt, MD;  Location: [facility] OR;  Service: Open Heart Surgery;  Laterality: N/A;  . THROMBECTOMY FEMORAL ARTERY Left 05/12/2019   Procedure: Left Ilio-Femoral Artery and Femoral-popliteal Graft Thrombectomy;  Surgeon: Meliana Canner E, MD;  Location: [facility] OR;  Service: Vascular;  Laterality: Left;  . THROMBECTOMY OF BYPASS GRAFT FEMORAL- POPLITEAL ARTERY Right 11/01/2020   Procedure: POSSIBLE THROMBOLYSIS VERSUS OPEN THROMBECTOMY AND REVSION OF RIGHT FEMORAL-POPLITEAL ARTERY BYPASS;  Surgeon: Chad Tiznado E, MD;  Location: [facility] OR;  Service: Vascular;  Laterality: Right;  . TUBAL LIGATION    . VISCERAL ANGIOGRAPHY N/A 04/25/2019   Procedure: MESENTRIC  ANGIOGRAPHY;  Surgeon: Deric Bocock E, MD;  Location: [facility] INVASIVE CV LAB;  Service: Cardiovascular;  Laterality: N/A;  . WOUND DEBRIDEMENT Left 11/03/2019   Procedure: Debridement Wound of left above the knee amputation;  Surgeon: Tayten Bergdoll E, MD;  Location: [facility] OR;  Service: Vascular;  Laterality: Left;    Family History  Problem Relation Age of Onset  . Arthritis Mother   . Emphysema Mother   . Arthritis Father   . COPD Father   . Heart disease Father   . Diabetes Paternal Aunt        paternal Aunts  . Heart disease Paternal Aunt   . Thyroid disease Neg Hx     SOCIAL HISTORY: Social History   Socioeconomic History  . Marital status: Single    Spouse name: Not on file  . Number of children: 3  . Years of education: Not on file  . Highest education level: Not on file   Occupational History  . Occupation: retired  Tobacco Use  . Smoking status: Former Smoker    Types: Cigarettes    Quit date: 2019    Years since quitting: 3.2  . Smokeless tobacco: Never Used  . Tobacco comment:    Vaping Use  . Vaping Use: Never used  Substance and Sexual Activity  . Alcohol use: No    Alcohol/week: 0.0 standard drinks  . Drug use: Not Currently    Types: Marijuana, Other-see comments    Comment:  and CBD oil  last use of both was 10/29/19  . Sexual activity: Not on file  Other Topics Concern  . Not on file  Social History Narrative   Lives alone.   Social Determinants of Health   Financial Resource Strain: Low Risk   . Difficulty of Paying   Living Expenses: Not hard at all  Food Insecurity: No Food Insecurity  . Worried About Programme researcher, broadcasting/film/video in the Last Year: Never true  . Ran Out of Food in the Last Year: Never true  Transportation Needs: No Transportation Needs  . Lack of Transportation (Medical): No  . Lack of Transportation (Non-Medical): No  Physical Activity: Inactive  . Days of Exercise per Week: 0 days  . Minutes of Exercise per Session: 0 min  Stress: No Stress Concern Present  . Feeling of Stress : Not at all  Social Connections: Unknown  . Frequency of Communication with Friends and Family: More than three times a week  . Frequency of Social Gatherings with Friends and Family: Once a week  . Attends Religious Services: Patient refused  . Active Member of Clubs or Organizations: Patient refused  . Attends Banker Meetings: Patient refused  . Marital Status: Never married  Intimate Partner Violence: Not At Risk  . Fear of Current or Ex-Partner: No  . Emotionally Abused: No  . Physically Abused: No  . Sexually Abused: No    Allergies  Allergen Reactions  . Brilinta [Ticagrelor] Anaphylaxis, Shortness Of Breath and Other (See Comments)    Also, chest tightness  . Latex Rash  . Tape Rash    Current Outpatient  Medications  Medication Sig Dispense Refill  . acetaminophen (TYLENOL) 500 MG tablet Take 500-1,000 mg by mouth every 8 (eight) hours as needed for mild pain or headache.    [name] aspirin EC 81 MG EC tablet Take 1 tablet (81 mg total) by mouth daily at 6 (six) AM. Swallow whole.    [name] ELIQUIS 5 MG TABS tablet TAKE 1 TABLET BY MOUTH TWICE DAILY (Patient taking differently: Take 5 mg by mouth 2 (two) times daily.) 180 tablet 1  . Ensure Max Protein (ENSURE MAX PROTEIN) LIQD Take 8 oz by mouth daily.    [name] ezetimibe (ZETIA) 10 MG tablet Take 1 tablet (10 mg total) by mouth daily. 90 tablet 3  . ferrous sulfate 325 (65 FE) MG tablet Take 1 tablet (325 mg total) by mouth 3 (three) times daily with meals. 90 tablet 1  . gabapentin (NEURONTIN) 300 MG capsule Take 1 capsule (300 mg total) by mouth 2 (two) times daily. 60 capsule 3  . metoprolol tartrate (LOPRESSOR) 25 MG tablet Take 0.5 tablets (12.5 mg total) by mouth 2 (two) times daily. 180 tablet 3  . Multiple Vitamin (MULTIVITAMIN WITH MINERALS) TABS tablet Take 1 tablet by mouth daily.    . nitroGLYCERIN (NITROSTAT) 0.4 MG SL tablet Place 0.4 mg under the tongue every 4 (four) hours as needed for chest pain.    . rosuvastatin (CRESTOR) 40 MG tablet Take 40 mg by mouth at bedtime.    [name] oxyCODONE-acetaminophen (PERCOCET/ROXICET) 5-325 MG tablet Take 1 tablet by mouth every 6 (six) hours as needed for moderate pain. (Patient not taking: Reported on 11/18/2020) 20 tablet 0   No current facility-administered medications for this visit.    ROS:   General:  No weight loss, Fever, chills  HEENT: No recent headaches, no nasal bleeding, no visual changes, no sore throat  Neurologic: No dizziness, blackouts, seizures. No recent symptoms of stroke or mini- stroke. No recent episodes of slurred speech, or temporary blindness.  Cardiac: No recent episodes of chest pain/pressure, no shortness of breath at rest.  No shortness of breath with exertion.  Denies  history of atrial fibrillation or irregular heartbeat  Vascular:  No history of rest pain in feet.  No history of claudication.  No history of non-healing ulcer, No history of DVT   Pulmonary: No home oxygen, no productive cough, no hemoptysis,  No asthma or wheezing  Musculoskeletal:  [X]  Arthritis, [X]  Low back pain,  [X]  Joint pain  Hematologic:No history of hypercoagulable state.  No history of easy bleeding.  No history of anemia  Gastrointestinal: No hematochezia or melena,  No gastroesophageal reflux, no trouble swallowing  Urinary: [ ]  chronic Kidney disease, [ ]  on HD - [ ]  MWF or [ ]  TTHS, [ ]  Burning with urination, [ ]  Frequent urination, [ ]  Difficulty urinating;   Skin: No rashes  Psychological: No history of anxiety,  No history of depression   Physical Examination  Vitals:   11/18/20 1441  BP: (!) 159/133  Pulse: 65  Resp: 20  Temp: 98.3 F (36.8 C)  SpO2: 96%  Weight: 89 lb (40.4 kg)  Height: 5\' 5"  (1.651 m)    Body mass index is 14.81 kg/m.  General:  Alert and oriented, seems to be in pain moving around frequently HEENT: Normal Neck: No JVD Pulmonary: Clear to auscultation bilaterally Cardiac: Regular Rate and Rhythm Abdomen: Soft, non-tender, non-distended Skin: No rash Extremity Pulses:  2+ radial, brachial, unable to palpate femoral pulses secondary to pain right groin incision healing no erythema no drainage femoral, absent right dorsalis pedis, posterior tibial pulses she does have a monophasic peroneal and posterior tibial Doppler signal in the right foot.  She has multiple blisters on the right plantar aspect of the foot and early gangrenous changes of the right fifth toe.  These are all similar to at the time of discharge.   Musculoskeletal: No deformity or edema, well-healed left above-knee amputation Neurologic: Upper and lower extremity motor 5/5 and symmetric  ASSESSMENT: Back hip lower extremity pain of unknown etiology.  She does have  some Doppler flow to her right foot although it is monophasic.  I am not sure if her graft is occluded but most likely it is patent since she does have some Doppler flow.  I am not really sure what is causing all of her pain.   PLAN: We will admit to [facility] for further evaluation of her right leg bypass as well as further work-up for her pain.  Plan will be to place her on IV pain medication while her work-up is in progress.  She is not a candidate for further revascularizations of her bypass.  However, I do not believe her constellations of symptoms are all completely related to this.  We will continue her Eliquis for now.  We will get a graft duplex of her right leg bypass as well as a right hip x-ray and lumbar spine x-ray   , MD [facility] [place] Office: [phone]

## 2020-11-19 ENCOUNTER — Encounter (HOSPITAL_COMMUNITY): Payer: Self-pay | Admitting: Vascular Surgery

## 2020-11-19 ENCOUNTER — Inpatient Hospital Stay (HOSPITAL_BASED_OUTPATIENT_CLINIC_OR_DEPARTMENT_OTHER): Payer: Medicare Other

## 2020-11-19 DIAGNOSIS — L0390 Cellulitis, unspecified: Secondary | ICD-10-CM | POA: Diagnosis not present

## 2020-11-19 DIAGNOSIS — Z9889 Other specified postprocedural states: Secondary | ICD-10-CM

## 2020-11-19 MED ORDER — ENSURE ENLIVE PO LIQD
237.0000 mL | Freq: Three times a day (TID) | ORAL | Status: DC
  Administered 2020-11-19 – 2020-11-26 (×14): 237 mL via ORAL

## 2020-11-19 NOTE — H&P (Shared)
Patient un willing to finish medical history at this time

## 2020-11-19 NOTE — Care Management CC44 (Signed)
Condition Code 44 Documentation Completed  Patient Details  Name: Mercedes [name] MRN: [id number] Date of Birth: 1955-10-24   Condition Code 44 given:  Yes Patient signature on Condition Code 44 notice:  Yes Documentation of 2 MD's agreement:  Yes Code 44 added to claim:  Yes    Gala Lewandowsky, RN 11/19/2020, 2:48 PM

## 2020-11-19 NOTE — Progress Notes (Signed)
Lower extremity bypass graft has been completed.   Preliminary results in CV Proc.   Blanch Media 11/19/2020 9:02 AM

## 2020-11-19 NOTE — Progress Notes (Signed)
Initial Nutrition Assessment  DOCUMENTATION CODES:   Severe malnutrition in context of chronic illness,Underweight  INTERVENTION:  Provide Ensure Enlive po TID, each supplement provides 350 kcal and 20 grams of protein.  Encourage adequate PO intake.   NUTRITION DIAGNOSIS:   Severe Malnutrition related to chronic illness (PAD s/p amputation) as evidenced by severe fat depletion,severe muscle depletion.  GOAL:   Patient will meet greater than or equal to 90% of their needs  MONITOR:   PO intake,Weight trends,Supplement acceptance,Skin,Labs,I & O's  REASON FOR ASSESSMENT:   Malnutrition Screening Tool (Low BMI)    ASSESSMENT:   65 year old female with history of PAD, L AKA.  Pt status post thrombectomy and revision of right femoral to below-knee popliteal bypass on 11/02/2020. Presents with back and hip pain with plans for further evaluation of her right leg bypass.   Pt reports having a good appetite currently and PTA with usual consumption of at least 3 meals a day. Pt additionally reports consuming 1-2 Ensure shakes a day. Per weight records, pt with a 9% weight loss in 5 months. RD to order nutritional supplements to aid in caloric and protein needs as well as in wound healing. Pt with ischemic changes to right 5th toe. Per MD, possible plans for R AKA on Monday.   NUTRITION - FOCUSED PHYSICAL EXAM:  Flowsheet Row Most Recent Value  Orbital Region Moderate depletion  Upper Arm Region Severe depletion  Thoracic and Lumbar Region Moderate depletion  Buccal Region Moderate depletion  Temple Region Severe depletion  Clavicle Bone Region Severe depletion  Clavicle and Acromion Bone Region Severe depletion  Scapular Bone Region Unable to assess  Dorsal Hand Unable to assess  Patellar Region Severe depletion  Anterior Thigh Region Severe depletion  Posterior Calf Region Severe depletion  Edema (RD Assessment) None  Hair Reviewed  Eyes Reviewed  Mouth Reviewed  Skin  Reviewed  Nails Reviewed     Labs and medications reviewed.   Diet Order:   Diet Order            Diet regular Room service appropriate? Yes; Fluid consistency: Thin  Diet effective now                 EDUCATION NEEDS:   Not appropriate for education at this time  Skin:  Skin Assessment: Skin Integrity Issues: Skin Integrity Issues:: Other (Comment) Other: L AKA  Last BM:  3/22  Height:   Ht Readings from Last 1 Encounters:  11/18/20 5\' 5"  (1.651 m)    Weight:   Wt Readings from Last 1 Encounters:  11/18/20 40.4 kg    Ideal Body Weight:  52.3 kg  BMI:  There is no height or weight on file to calculate BMI.  Estimated Nutritional Needs:   Kcal:  1500-1700  Protein:  70-85 grams   Fluid:  >/= 1.5 L/day  11/20/20, MS, RD, LDN RD pager number/after hours weekend pager number on Amion.

## 2020-11-19 NOTE — Progress Notes (Addendum)
Progress Note    11/19/2020 7:02 AM * No surgery found *  Subjective:  States her most nagging pain this morning is proximal right lower leg/knee particularly with movement.  Better with pain med. Foot also hurts. Back and hip pain resolved.   Vitals:   11/18/20 1845 11/19/20 0005  BP: (!) 170/92 127/84  Pulse:  (!) 56  Resp:  18  Temp:  98.6 F (37 C)  SpO2:  100%    Physical Exam: General appearance: Awake, alert in no apparent distress Cardiac: Heart rate and rhythm are regular Respirations: Nonlabored Incisions: Right groin and lower leg incisions are all well approximated without sihns of infection Extremities: right foot is warm with intact sensation and motor function.  Ischemic changes to right 5th toe. Skin changes as noted in photo:         Lumbar spine plain films: There is diffuse lumbar facet hypertrophy greatest at the lumbosacral junction. Mild spondylosis at the L5-S1 level. No acute displaced fractures. Sacroiliac joints are normal. Pelvis: Degenerative changes at the lumbosacral junction. Right iliac stent overlies the lower lumbar spine.  CBC    Component Value Date/Time   WBC 5.5 11/18/2020 1709   RBC 4.03 11/18/2020 1709   HGB 9.8 (L) 11/18/2020 1709   HGB 14.2 10/09/2018 1125   HCT 32.6 (L) 11/18/2020 1709   HCT 44.6 10/09/2018 1125   PLT 434 (H) 11/18/2020 1709   PLT 289 10/09/2018 1125   MCV 80.9 11/18/2020 1709   MCV 79 10/09/2018 1125   MCH 24.3 (L) 11/18/2020 1709   MCHC 30.1 11/18/2020 1709   RDW 19.6 (H) 11/18/2020 1709   RDW 14.8 10/09/2018 1125   LYMPHSABS 1.7 08/27/2020 0056   MONOABS 0.4 08/27/2020 0056   EOSABS 0.4 08/27/2020 0056   BASOSABS 0.0 08/27/2020 0056    BMET    Component Value Date/Time   NA 133 (L) 11/18/2020 1709   NA 135 10/09/2018 1125   K 3.9 11/18/2020 1709   CL 101 11/18/2020 1709   CO2 23 11/18/2020 1709   GLUCOSE 102 (H) 11/18/2020 1709   BUN 13 11/18/2020 1709   BUN 10 10/09/2018 1125    CREATININE 0.79 11/18/2020 1709   CREATININE 1.13 (H) 08/03/2016 1440   CALCIUM 10.0 11/18/2020 1709   GFRNONAA >60 11/18/2020 1709   GFRAA >60 11/24/2019 0620    No intake or output data in the 24 hours ending 11/19/20 0702  HOSPITAL MEDICATIONS Scheduled Meds: . apixaban  5 mg Oral BID  . aspirin EC  81 mg Oral Daily  . ezetimibe  10 mg Oral Daily  . gabapentin  300 mg Oral BID  . metoprolol tartrate  12.5 mg Oral BID  . pantoprazole  40 mg Oral Daily  . potassium chloride  20-40 mEq Oral Once  . Ensure Max Protein  330 mL Oral Daily  . rosuvastatin  40 mg Oral QHS   Continuous Infusions: . sodium chloride 50 mL/hr at 11/18/20 1752   PRN Meds:.acetaminophen **OR** acetaminophen, alum & mag hydroxide-simeth, guaiFENesin-dextromethorphan, hydrALAZINE, HYDROmorphone (DILAUDID) injection, labetalol, metoprolol tartrate, ondansetron, oxyCODONE-acetaminophen, phenol  Assessment and Plan: Admitted secondary to back and hip pain.  She is status post thrombectomy and revision of right femoral to below-knee popliteal bypass on November 02, 2020.  The graft was revised and extended down to the tibioperoneal trunk with an interposition 6 mm PTFE.  Right foot warm.  Her back pain has resolved. Plain films with acute findings. Duplex of RLE is  pending. VSS. Afebrile. Labs unremarkable with the exception of thrombocytosis and this is improved from 11/06/2020.  Continue asa, statin and apixaban.   Wendi Maya, PA-C [facility] [phone] 11/19/2020  7:02 AM   Bypass graft is occluded.  She has better pain control today.  No options for further revascularization.  D/w pt right AKA.  She wants to think about it for now.  Right AKA on Monday if she decides to proceed D/c Eliquis.  Convert to heparin drip in 36 hours  Fabienne Bruns, MD [facility] [place] Office: [phone]

## 2020-11-19 NOTE — Care Management Obs Status (Signed)
MEDICARE OBSERVATION STATUS NOTIFICATION   Patient Details  Name: Mercedes [name] MRN: [id number] Date of Birth: 01/13/1956   Medicare Observation Status Notification Given:  Yes    Gala Lewandowsky, RN 11/19/2020, 2:48 PM

## 2020-11-20 ENCOUNTER — Other Ambulatory Visit: Payer: Self-pay

## 2020-11-20 LAB — CBC
HCT: 29.5 % — ABNORMAL LOW (ref 36.0–46.0)
Hemoglobin: 9 g/dL — ABNORMAL LOW (ref 12.0–15.0)
MCH: 24.5 pg — ABNORMAL LOW (ref 26.0–34.0)
MCHC: 30.5 g/dL (ref 30.0–36.0)
MCV: 80.2 fL (ref 80.0–100.0)
Platelets: 412 10*3/uL — ABNORMAL HIGH (ref 150–400)
RBC: 3.68 MIL/uL — ABNORMAL LOW (ref 3.87–5.11)
RDW: 19 % — ABNORMAL HIGH (ref 11.5–15.5)
WBC: 7.6 10*3/uL (ref 4.0–10.5)
nRBC: 0 % (ref 0.0–0.2)

## 2020-11-20 LAB — HEPARIN LEVEL (UNFRACTIONATED): Heparin Unfractionated: 0.2 IU/mL — ABNORMAL LOW (ref 0.30–0.70)

## 2020-11-20 LAB — APTT: aPTT: 29 seconds (ref 24–36)

## 2020-11-20 MED ORDER — HEPARIN (PORCINE) 25000 UT/250ML-% IV SOLN: 600.0000 [IU]/h | INTRAVENOUS | Status: DC

## 2020-11-20 MED ORDER — HEPARIN (PORCINE) 25000 UT/250ML-% IV SOLN
600.0000 [IU]/h | INTRAVENOUS | Status: DC
  Filled 2020-11-20: qty 250

## 2020-11-20 MED ORDER — HEPARIN (PORCINE) 25000 UT/250ML-% IV SOLN
800.0000 [IU]/h | INTRAVENOUS | Status: DC
  Administered 2020-11-20: 600 [IU]/h via INTRAVENOUS
  Administered 2020-11-22 (×2): 800 [IU]/h via INTRAVENOUS
  Filled 2020-11-20 (×3): qty 250

## 2020-11-20 NOTE — Progress Notes (Signed)
ANTICOAGULATION CONSULT NOTE - Initial Consult  Pharmacy Consult for Heparin Indication: atrial fibrillation  Allergies  Allergen Reactions  . Brilinta [Ticagrelor] Anaphylaxis, Shortness Of Breath and Other (See Comments)    Also, chest tightness  . Latex Rash  . Tape Rash    Patient Measurements: Weight: 40.9 kg (90 lb 2.7 oz) Heparin Dosing Weight: 40.4 kg  Vital Signs: Temp: 98.6 F (37 C) (03/26 0744) Temp Source: Oral (03/26 0744) BP: 114/64 (03/26 0744) Pulse Rate: 58 (03/26 0744)  Labs: Recent Labs    11/18/20 1709  HGB 9.8*  HCT 32.6*  PLT 434*  CREATININE 0.79    Estimated Creatinine Clearance: 44.7 mL/min (by C-G formula based on SCr of 0.79 mg/dL).   Medical History: Past Medical History:  Diagnosis Date  . Abnormal LFTs    a. in the past when on Lipitor  . Anemia    as a young woman  . Anxiety   . Arnold-Chiari malformation, type I (HCC)    MRI brain 02/2010  . CAD (coronary artery disease)    a. NSTEMI 07/2009: LHC - D2 40%, LAD irreg., EF 50% with apical AK (tako-tsubo CM);  b. Inf STEMI (04/2013): Tx Promus DES to CFX;  c. 08/2013 Lexi CL: No ischemia, dist ant/ap/inf-ap infarct, EF  d. 06/2016 NSTEMI with LM disease: s/p CABG on 07/17/2016 w/ LIMA-LAD, SVG-OM, and SVG-dRCA.  [name] DEPRESSION   . Dizziness    ? CVA 01/2010 - carotid dopplers with no ICA stenosis  . GERD (gastroesophageal reflux disease)   . Headache   . 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 HYPERLIPIDEMIA   . HYPERTENSION   . MI (myocardial infarction) (HCC)   . PAD (peripheral artery disease) (HCC)    s/p L fem pop 11/2013 in setting of 1st toe osteo/gangrene  . Paroxysmal atrial fibrillation (HCC)    a. anticoagulated with Eliquis 10/2014  . 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 RA (rheumatoid arthritis) (HCC)    prior tx by Dr. Dareen Piano  . Sjogren's syndrome (HCC) 06/02/13   pt denies this (12/01/13)   . SLE (systemic lupus erythematosus) (HCC) dx 05/2013   follows with rheum Dareen Piano)  . Tachy-brady syndrome (HCC)    a. s/p STJ Leadless pacemaker 11-04-2014 Dr Johney Frame  . 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 Wears dentures   . Wears glasses     Assessment: 65 yo female on apixaban PTA for atrial fibrillation, last dose 3/25 @ 0829. The patient is s/p thrombectomy and revision of R femoral to below-knee popliteal bypass on 3/8.The graft was revised and extended down to the tibioperoneal trunk with an interposition 6 mm PTFE. The right fem-pop bypass graft is now occluded. The patient is tenatively planned for right AKA on 3/28. Pharmacy is consulted to dose heparin while apixaban is held.   I discussed with Dr. Lenell Antu, will start heparin infusion today at 2000. Due to the patient's recent DOAC administration, will monitor therapy using aPTTs until the aPTT begins to correlate to the heparin level.  Goal of Therapy:  Heparin level 0.3-0.7 units/ml aPTT 66-102 seconds Monitor platelets by anticoagulation protocol: Yes   Plan:  Initiate heparin IV at 600 units/hr starting on 3/26 at 2000 Obtain a 6-hour heparin level after heparin infusion is restarted Obtain a baseline heparin level, aPTT, and CBC now Obtain daily heparin level,  aPTT, and CBC Monitor for signs and symptoms of bleeding   Sanda Klein, PharmD, RPh  PGY-1 Pharmacy Resident 11/20/2020 8:50 AM  Please check [email or web address] for unit-specific pharmacy phone numbers.

## 2020-11-20 NOTE — Progress Notes (Signed)
[facility] [place] PROGRESS NOTE  ASSESSMENT / PLAN: Mercedes [name] is a 65 y.o. female with non-salvageable chronically ischemic right lower extremity.  We had a long discussion about the need for amputation and her lack of options for revascularization.  She is understanding, but still would like some more time to think about the amputation.  We will offer supportive care for the time being.  We will ask the chaplain to see her at her request.  SUBJECTIVE: Tearful today.  I spent about 20 minutes talking with the patient about major amputation and what it means for her going forward.  She is having some pain in her right foot.  OBJECTIVE: BP 114/64 (BP Location: Right Arm)   Pulse 63   Temp 98.6 F (37 C) (Oral)   Resp 19   Ht 5\' 5"  (1.651 m)   Wt 40.9 kg   SpO2 97%   BMI 15.00 kg/m   Intake/Output Summary (Last 24 hours) at 11/20/2020 1158 Last data filed at 11/20/2020 0800 Gross per 24 hour  Intake 1813.42 ml  Output 1000 ml  Net 813.42 ml    Right foot warm.  Motor and sensory function intact.  CBC Latest Ref Rng & Units 11/20/2020 11/18/2020 11/07/2020  WBC 4.0 - 10.5 K/uL 7.6 5.5 10.1  Hemoglobin 12.0 - 15.0 g/dL 9.0(L) 9.8(L) 7.3(L)  Hematocrit 36.0 - 46.0 % 29.5(L) 32.6(L) 23.7(L)  Platelets 150 - 400 K/uL 412(H) 434(H) 607(H)     CMP Latest Ref Rng & Units 11/18/2020 11/07/2020 11/03/2020  Glucose 70 - 99 mg/dL 01/03/2021) 680(H) 212(Y)  BUN 8 - 23 mg/dL 13 11 9   Creatinine 0.44 - 1.00 mg/dL 482(N 0.03  Sodium 135 - 145 mmol/L 133(L) 132(L) 133(L)  Potassium 3.5 - 5.1 mmol/L 3.9 4.1 3.8  Chloride 98 - 111 mmol/L 101 100 102  CO2 22 - 32 mmol/L 23 27 24   Calcium 8.9 - 10.3 mg/dL 7.04 8.9 8.88)  Total Protein 6.5 - 8.1 g/dL - - -  Total Bilirubin 0.3 - 1.2 mg/dL - - -  Alkaline Phos 38 - 126 U/L - - -  AST 15 - 41 U/L - - -  ALT 0 - 44 U/L - - -    Estimated Creatinine Clearance: 44.7 mL/min (by C-G formula based on SCr of 0.79  mg/dL).  . 91.6, MD [facility] Phone Number: [phone] 11/20/2020 11:58 AM

## 2020-11-21 DIAGNOSIS — E43 Unspecified severe protein-calorie malnutrition: Secondary | ICD-10-CM | POA: Insufficient documentation

## 2020-11-21 LAB — CBC
HCT: 27.3 % — ABNORMAL LOW (ref 36.0–46.0)
Hemoglobin: 8.2 g/dL — ABNORMAL LOW (ref 12.0–15.0)
MCH: 24.3 pg — ABNORMAL LOW (ref 26.0–34.0)
MCHC: 30 g/dL (ref 30.0–36.0)
MCV: 81 fL (ref 80.0–100.0)
Platelets: 383 10*3/uL (ref 150–400)
RBC: 3.37 MIL/uL — ABNORMAL LOW (ref 3.87–5.11)
RDW: 19.3 % — ABNORMAL HIGH (ref 11.5–15.5)
WBC: 4.7 10*3/uL (ref 4.0–10.5)
nRBC: 0 % (ref 0.0–0.2)

## 2020-11-21 LAB — APTT
aPTT: 43 seconds — ABNORMAL HIGH (ref 24–36)
aPTT: 53 seconds — ABNORMAL HIGH (ref 24–36)

## 2020-11-21 LAB — HEPARIN LEVEL (UNFRACTIONATED)
Heparin Unfractionated: 0.22 IU/mL — ABNORMAL LOW (ref 0.30–0.70)
Heparin Unfractionated: 0.28 IU/mL — ABNORMAL LOW (ref 0.30–0.70)

## 2020-11-21 NOTE — Progress Notes (Signed)
ANTICOAGULATION CONSULT NOTE - Follow Up  Pharmacy Consult for Heparin Indication: atrial fibrillation  Allergies  Allergen Reactions  . Brilinta [Ticagrelor] Anaphylaxis, Shortness Of Breath and Other (See Comments)    Also, chest tightness  . Latex Rash  . Tape Rash    Patient Measurements: Height: 5\' 5"  (165.1 cm) Weight: 40.1 kg (88 lb 6.5 oz) IBW/kg (Calculated) : 57 Heparin Dosing Weight: 40.4 kg  Vital Signs: Temp: 98.5 F (36.9 C) (03/27 0458) Temp Source: Oral (03/27 0458) BP: 123/70 (03/27 0458) Pulse Rate: 54 (03/27 0458)  Labs: Recent Labs    11/18/20 1709 11/20/20 1012 11/21/20 0326  HGB 9.8* 9.0* 8.2*  HCT 32.6* 29.5* 27.3*  PLT 434* 412* 383  APTT  --  29 43*  HEPARINUNFRC  --  0.20* 0.22*  CREATININE 0.79  --   --     Estimated Creatinine Clearance: 43.8 mL/min (by C-G formula based on SCr of 0.79 mg/dL).   Medical History: Past Medical History:  Diagnosis Date  . Abnormal LFTs    a. in the past when on Lipitor  . Anemia    as a young woman  . Anxiety   . Arnold-Chiari malformation, type I (HCC)    MRI brain 02/2010  . CAD (coronary artery disease)    a. NSTEMI 07/2009: LHC - D2 40%, LAD irreg., EF 50% with apical AK (tako-tsubo CM);  b. Inf STEMI (04/2013): Tx Promus DES to CFX;  c. 08/2013 Lexi CL: No ischemia, dist ant/ap/inf-ap infarct, EF  d. 06/2016 NSTEMI with LM disease: s/p CABG on 07/17/2016 w/ LIMA-LAD, SVG-OM, and SVG-dRCA.  07/19/2016 DEPRESSION   . Dizziness    ? CVA 01/2010 - carotid dopplers with no ICA stenosis  . GERD (gastroesophageal reflux disease)   . Headache   . 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 HYPERLIPIDEMIA   . HYPERTENSION   . MI (myocardial infarction) (HCC)   . PAD (peripheral artery disease) (HCC)    s/p L fem pop 11/2013 in setting of 1st toe osteo/gangrene  . Paroxysmal atrial fibrillation (HCC)    a. anticoagulated with Eliquis 10/2014  . Presence of permanent cardiac pacemaker  10/2014   leadless permanent pacemaker  . RA (rheumatoid arthritis) (HCC)    prior tx by Dr. 11/2014  . Sjogren's syndrome (HCC) 06/02/13   pt denies this (12/01/13)  . SLE (systemic lupus erythematosus) (HCC) dx 05/2013   follows with rheum 06/2013)  . Tachy-brady syndrome (HCC)    a. s/p STJ Leadless pacemaker 11-04-2014 Dr 01-04-2015  . 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 Wears dentures   . Wears glasses     Assessment: 65 yo female on apixaban PTA for atrial fibrillation, last dose 3/25 @ 0829. The patient is s/p thrombectomy and revision of R femoral to below-knee popliteal bypass on 3/8.The graft was revised and extended down to the tibioperoneal trunk with an interposition 6 mm PTFE. The right fem-pop bypass graft is now occluded. The patient is tenatively planned for right AKA on 3/28. Pharmacy is consulted to dose heparin while apixaban is held.   As discussed with Dr. 4/28, heparin infusion started 3/26 @ 2014. Due to the patient's recent DOAC administration, will monitor therapy using aPTTs until the aPTT begins to correlate to the heparin level.  aPTT is subtherapeutic at 53 while running at 700 units/hr. Hgb 8.2, platelets 383. Per the RN,  there were no issues with the infusion and the patient is without signs or symptoms of bleeding.  Goal of Therapy:  Heparin level 0.3-0.7 units/ml aPTT 66-102 seconds Monitor platelets by anticoagulation protocol: Yes   Plan:  Increase heparin IV to 800 units/hr Obtain daily heparin level, aPTT, and CBC Monitor for signs and symptoms of bleeding   Sanda Klein, PharmD, RPh  PGY-1 Pharmacy Resident 11/21/2020 7:52 AM  Please check [email or web address] for unit-specific pharmacy phone numbers.

## 2020-11-21 NOTE — H&P (View-Only) (Signed)
  Progress Note    11/21/2020 9:54 AM * No surgery found *  Subjective:  Says she has been overwhelmed with everyone talking about her going to surgery tomorrow. Says she has not made a decision yet but she knows she has to have this done. Doesn't want to be in pain. Pain is presently tolerable   Vitals:   11/21/20 0458 11/21/20 0842  BP: 123/70 (!) 142/71  Pulse: (!) 54 65  Resp: 15   Temp: 98.5 F (36.9 C)   SpO2: 99%    Physical Exam: Cardiac: regular Lungs: non labored Extremities: rle warm. Motor and sensation intact. Neurologic: alert and oriented  CBC    Component Value Date/Time   WBC 4.7 11/21/2020 0326   RBC 3.37 (L) 11/21/2020 0326   HGB 8.2 (L) 11/21/2020 0326   HGB 14.2 10/09/2018 1125   HCT 27.3 (L) 11/21/2020 0326   HCT 44.6 10/09/2018 1125   PLT 383 11/21/2020 0326   PLT 289 10/09/2018 1125   MCV 81.0 11/21/2020 0326   MCV 79 10/09/2018 1125   MCH 24.3 (L) 11/21/2020 0326   MCHC 30.0 11/21/2020 0326   RDW 19.3 (H) 11/21/2020 0326   RDW 14.8 10/09/2018 1125   LYMPHSABS 1.7 08/27/2020 0056   MONOABS 0.4 08/27/2020 0056   EOSABS 0.4 08/27/2020 0056   BASOSABS 0.0 08/27/2020 0056    BMET    Component Value Date/Time   NA 133 (L) 11/18/2020 1709   NA 135 10/09/2018 1125   K 3.9 11/18/2020 1709   CL 101 11/18/2020 1709   CO2 23 11/18/2020 1709   GLUCOSE 102 (H) 11/18/2020 1709   BUN 13 11/18/2020 1709   BUN 10 10/09/2018 1125   CREATININE 0.79 11/18/2020 1709   CREATININE 1.13 (H) 08/03/2016 1440   CALCIUM 10.0 11/18/2020 1709   GFRNONAA >60 11/18/2020 1709   GFRAA >60 11/24/2019 0620    INR    Component Value Date/Time   INR 1.0 11/18/2019 1844     Intake/Output Summary (Last 24 hours) at 11/21/2020 0954 Last data filed at 11/21/2020 0459 Gross per 24 hour  Intake 2034.64 ml  Output 2000 ml  Net 34.64 ml     Assessment/Plan:  65 y.o. female with non salvageable chronic lower extremity ischemia. Patient understands that she  needs this done and this is her only option. She is agreeable to proceed however she would like to speak with Dr. Darrick Penna in the morning prior to going to OR. Will post her for tomorrow in OR. NPO after midnight. Will place consent in chart.   DVT prophylaxis: Heparin IV   Graceann Congress, PA-C [facility] [phone] 11/21/2020 9:54 AM   VASCULAR STAFF ADDENDUM: I have independently interviewed and examined the patient. I agree with the above.  If she is agreeable, I have time to do her amputation tomorrow. She would like to discuss with Dr. Darrick Penna in the AM. Will keep her on the schedule. NPO after midnight.  Rande Brunt. Lenell Antu, MD [facility] Phone Number: [phone] 11/21/2020 11:30 AM

## 2020-11-21 NOTE — Progress Notes (Signed)
ANTICOAGULATION CONSULT NOTE   Pharmacy Consult for Heparin Indication: atrial fibrillation  Allergies  Allergen Reactions  . Brilinta [Ticagrelor] Anaphylaxis, Shortness Of Breath and Other (See Comments)    Also, chest tightness  . Latex Rash  . Tape Rash    Patient Measurements: Height: 5\' 5"  (165.1 cm) Weight: 40.1 kg (88 lb 6.5 oz) IBW/kg (Calculated) : 57 Heparin Dosing Weight: 40.4 kg  Vital Signs: Temp: 98.5 F (36.9 C) (03/27 0458) Temp Source: Oral (03/27 0458) BP: 123/70 (03/27 0458) Pulse Rate: 54 (03/27 0458)  Labs: Recent Labs    11/18/20 1709 11/20/20 1012 11/21/20 0326  HGB 9.8* 9.0* 8.2*  HCT 32.6* 29.5* 27.3*  PLT 434* 412* 383  APTT  --  29 43*  HEPARINUNFRC  --  0.20* 0.22*  CREATININE 0.79  --   --     Estimated Creatinine Clearance: 43.8 mL/min (by C-G formula based on SCr of 0.79 mg/dL).   Medical History: Past Medical History:  Diagnosis Date  . Abnormal LFTs    a. in the past when on Lipitor  . Anemia    as a young woman  . Anxiety   . Arnold-Chiari malformation, type I (HCC)    MRI brain 02/2010  . CAD (coronary artery disease)    a. NSTEMI 07/2009: LHC - D2 40%, LAD irreg., EF 50% with apical AK (tako-tsubo CM);  b. Inf STEMI (04/2013): Tx Promus DES to CFX;  c. 08/2013 Lexi CL: No ischemia, dist ant/ap/inf-ap infarct, EF  d. 06/2016 NSTEMI with LM disease: s/p CABG on 07/17/2016 w/ LIMA-LAD, SVG-OM, and SVG-dRCA.  07/19/2016 DEPRESSION   . Dizziness    ? CVA 01/2010 - carotid dopplers with no ICA stenosis  . GERD (gastroesophageal reflux disease)   . Headache   . 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 HYPERLIPIDEMIA   . HYPERTENSION   . MI (myocardial infarction) (HCC)   . PAD (peripheral artery disease) (HCC)    s/p L fem pop 11/2013 in setting of 1st toe osteo/gangrene  . Paroxysmal atrial fibrillation (HCC)    a. anticoagulated with Eliquis 10/2014  . Presence of permanent cardiac pacemaker 10/2014    leadless permanent pacemaker  . RA (rheumatoid arthritis) (HCC)    prior tx by Dr. 11/2014  . Sjogren's syndrome (HCC) 06/02/13   pt denies this (12/01/13)  . SLE (systemic lupus erythematosus) (HCC) dx 05/2013   follows with rheum 06/2013)  . Tachy-brady syndrome (HCC)    a. s/p STJ Leadless pacemaker 11-04-2014 Dr 01-04-2015  . 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 Wears dentures   . Wears glasses     Assessment: 65 yo female on apixaban PTA for atrial fibrillation, last dose 3/25 @ 0829. The patient is s/p thrombectomy and revision of R femoral to below-knee popliteal bypass on 3/8.The graft was revised and extended down to the tibioperoneal trunk with an interposition 6 mm PTFE. The right fem-pop bypass graft is now occluded. The patient is tenatively planned for right AKA on 3/28. Pharmacy is consulted to dose heparin while apixaban is held.   I discussed with Dr. 4/28, will start heparin infusion today at 2000. Due to the patient's recent DOAC administration, will monitor therapy using aPTTs until the aPTT begins to correlate to the heparin level.  3/27 AM update:  Heparin level and aPTT are low Should be able to start using heparin level  only 3/28  Goal of Therapy:  Heparin level 0.3-0.7 units/ml aPTT 66-102 seconds Monitor platelets by anticoagulation protocol: Yes   Plan:  Inc heparin to 700 units/hr 1400 aPTT/heparin level  Abran Duke, PharmD, BCPS Clinical Pharmacist Phone: [phone]

## 2020-11-21 NOTE — Progress Notes (Addendum)
  Progress Note    11/21/2020 9:54 AM * No surgery found *  Subjective:  Says she has been overwhelmed with everyone talking about her going to surgery tomorrow. Says she has not made a decision yet but she knows she has to have this done. Doesn't want to be in pain. Pain is presently tolerable   Vitals:   11/21/20 0458 11/21/20 0842  BP: 123/70 (!) 142/71  Pulse: (!) 54 65  Resp: 15   Temp: 98.5 F (36.9 C)   SpO2: 99%    Physical Exam: Cardiac: regular Lungs: non labored Extremities: rle warm. Motor and sensation intact. Neurologic: alert and oriented  CBC    Component Value Date/Time   WBC 4.7 11/21/2020 0326   RBC 3.37 (L) 11/21/2020 0326   HGB 8.2 (L) 11/21/2020 0326   HGB 14.2 10/09/2018 1125   HCT 27.3 (L) 11/21/2020 0326   HCT 44.6 10/09/2018 1125   PLT 383 11/21/2020 0326   PLT 289 10/09/2018 1125   MCV 81.0 11/21/2020 0326   MCV 79 10/09/2018 1125   MCH 24.3 (L) 11/21/2020 0326   MCHC 30.0 11/21/2020 0326   RDW 19.3 (H) 11/21/2020 0326   RDW 14.8 10/09/2018 1125   LYMPHSABS 1.7 08/27/2020 0056   MONOABS 0.4 08/27/2020 0056   EOSABS 0.4 08/27/2020 0056   BASOSABS 0.0 08/27/2020 0056    BMET    Component Value Date/Time   NA 133 (L) 11/18/2020 1709   NA 135 10/09/2018 1125   K 3.9 11/18/2020 1709   CL 101 11/18/2020 1709   CO2 23 11/18/2020 1709   GLUCOSE 102 (H) 11/18/2020 1709   BUN 13 11/18/2020 1709   BUN 10 10/09/2018 1125   CREATININE 0.79 11/18/2020 1709   CREATININE 1.13 (H) 08/03/2016 1440   CALCIUM 10.0 11/18/2020 1709   GFRNONAA >60 11/18/2020 1709   GFRAA >60 11/24/2019 0620    INR    Component Value Date/Time   INR 1.0 11/18/2019 1844     Intake/Output Summary (Last 24 hours) at 11/21/2020 0954 Last data filed at 11/21/2020 0459 Gross per 24 hour  Intake 2034.64 ml  Output 2000 ml  Net 34.64 ml     Assessment/Plan:  64 y.o. female with non salvageable chronic lower extremity ischemia. Patient understands that she  needs this done and this is her only option. She is agreeable to proceed however she would like to speak with Dr. Fields in the morning prior to going to OR. Will post her for tomorrow in OR. NPO after midnight. Will place consent in chart.   DVT prophylaxis: Heparin IV   Corrina Baglia, PA-C [facility] [phone] 11/21/2020 9:54 AM   VASCULAR STAFF ADDENDUM: I have independently interviewed and examined the patient. I agree with the above.  If she is agreeable, I have time to do her amputation tomorrow. She would like to discuss with Dr. Fields in the AM. Will keep her on the schedule. NPO after midnight.  Rondale Nies N. Analilia Geddis, MD [facility] [place] Office Phone Number: [phone] 11/21/2020 11:30 AM    

## 2020-11-22 ENCOUNTER — Encounter (HOSPITAL_COMMUNITY): Payer: Self-pay | Admitting: Vascular Surgery

## 2020-11-22 ENCOUNTER — Other Ambulatory Visit: Payer: Self-pay

## 2020-11-22 DIAGNOSIS — Z4781 Encounter for orthopedic aftercare following surgical amputation: Secondary | ICD-10-CM | POA: Diagnosis not present

## 2020-11-22 DIAGNOSIS — Z951 Presence of aortocoronary bypass graft: Secondary | ICD-10-CM | POA: Diagnosis not present

## 2020-11-22 DIAGNOSIS — Z833 Family history of diabetes mellitus: Secondary | ICD-10-CM | POA: Diagnosis not present

## 2020-11-22 DIAGNOSIS — I70261 Atherosclerosis of native arteries of extremities with gangrene, right leg: Secondary | ICD-10-CM | POA: Diagnosis not present

## 2020-11-22 DIAGNOSIS — M6281 Muscle weakness (generalized): Secondary | ICD-10-CM | POA: Diagnosis not present

## 2020-11-22 DIAGNOSIS — R5381 Other malaise: Secondary | ICD-10-CM | POA: Diagnosis not present

## 2020-11-22 DIAGNOSIS — D62 Acute posthemorrhagic anemia: Secondary | ICD-10-CM | POA: Diagnosis not present

## 2020-11-22 DIAGNOSIS — I998 Other disorder of circulatory system: Secondary | ICD-10-CM | POA: Diagnosis not present

## 2020-11-22 DIAGNOSIS — Z7401 Bed confinement status: Secondary | ICD-10-CM | POA: Diagnosis not present

## 2020-11-22 DIAGNOSIS — Z7982 Long term (current) use of aspirin: Secondary | ICD-10-CM | POA: Diagnosis not present

## 2020-11-22 DIAGNOSIS — E440 Moderate protein-calorie malnutrition: Secondary | ICD-10-CM | POA: Diagnosis not present

## 2020-11-22 DIAGNOSIS — D649 Anemia, unspecified: Secondary | ICD-10-CM | POA: Diagnosis not present

## 2020-11-22 DIAGNOSIS — M069 Rheumatoid arthritis, unspecified: Secondary | ICD-10-CM | POA: Diagnosis not present

## 2020-11-22 DIAGNOSIS — Z8261 Family history of arthritis: Secondary | ICD-10-CM | POA: Diagnosis not present

## 2020-11-22 DIAGNOSIS — Z743 Need for continuous supervision: Secondary | ICD-10-CM | POA: Diagnosis not present

## 2020-11-22 DIAGNOSIS — Z888 Allergy status to other drugs, medicaments and biological substances status: Secondary | ICD-10-CM | POA: Diagnosis not present

## 2020-11-22 DIAGNOSIS — I70221 Atherosclerosis of native arteries of extremities with rest pain, right leg: Secondary | ICD-10-CM | POA: Diagnosis not present

## 2020-11-22 DIAGNOSIS — K219 Gastro-esophageal reflux disease without esophagitis: Secondary | ICD-10-CM | POA: Diagnosis not present

## 2020-11-22 DIAGNOSIS — Z89421 Acquired absence of other right toe(s): Secondary | ICD-10-CM | POA: Diagnosis not present

## 2020-11-22 DIAGNOSIS — Z89611 Acquired absence of right leg above knee: Secondary | ICD-10-CM | POA: Diagnosis not present

## 2020-11-22 DIAGNOSIS — Z7901 Long term (current) use of anticoagulants: Secondary | ICD-10-CM | POA: Diagnosis not present

## 2020-11-22 DIAGNOSIS — Z91048 Other nonmedicinal substance allergy status: Secondary | ICD-10-CM | POA: Diagnosis not present

## 2020-11-22 DIAGNOSIS — M2550 Pain in unspecified joint: Secondary | ICD-10-CM | POA: Diagnosis not present

## 2020-11-22 DIAGNOSIS — R2689 Other abnormalities of gait and mobility: Secondary | ICD-10-CM | POA: Diagnosis not present

## 2020-11-22 DIAGNOSIS — Z825 Family history of asthma and other chronic lower respiratory diseases: Secondary | ICD-10-CM | POA: Diagnosis not present

## 2020-11-22 DIAGNOSIS — I10 Essential (primary) hypertension: Secondary | ICD-10-CM | POA: Diagnosis not present

## 2020-11-22 DIAGNOSIS — Z8673 Personal history of transient ischemic attack (TIA), and cerebral infarction without residual deficits: Secondary | ICD-10-CM | POA: Diagnosis not present

## 2020-11-22 DIAGNOSIS — Z89422 Acquired absence of other left toe(s): Secondary | ICD-10-CM | POA: Diagnosis not present

## 2020-11-22 DIAGNOSIS — M79604 Pain in right leg: Secondary | ICD-10-CM | POA: Diagnosis not present

## 2020-11-22 DIAGNOSIS — I252 Old myocardial infarction: Secondary | ICD-10-CM | POA: Diagnosis not present

## 2020-11-22 DIAGNOSIS — I70201 Unspecified atherosclerosis of native arteries of extremities, right leg: Secondary | ICD-10-CM | POA: Diagnosis not present

## 2020-11-22 DIAGNOSIS — Z89612 Acquired absence of left leg above knee: Secondary | ICD-10-CM | POA: Diagnosis not present

## 2020-11-22 DIAGNOSIS — I2510 Atherosclerotic heart disease of native coronary artery without angina pectoris: Secondary | ICD-10-CM | POA: Diagnosis not present

## 2020-11-22 DIAGNOSIS — Z950 Presence of cardiac pacemaker: Secondary | ICD-10-CM | POA: Diagnosis not present

## 2020-11-22 DIAGNOSIS — I480 Paroxysmal atrial fibrillation: Secondary | ICD-10-CM | POA: Diagnosis not present

## 2020-11-22 DIAGNOSIS — Z20822 Contact with and (suspected) exposure to covid-19: Secondary | ICD-10-CM | POA: Diagnosis not present

## 2020-11-22 DIAGNOSIS — E785 Hyperlipidemia, unspecified: Secondary | ICD-10-CM | POA: Diagnosis not present

## 2020-11-22 LAB — APTT: aPTT: 82 seconds — ABNORMAL HIGH (ref 24–36)

## 2020-11-22 LAB — BASIC METABOLIC PANEL
Anion gap: 6 (ref 5–15)
BUN: 13 mg/dL (ref 8–23)
CO2: 26 mmol/L (ref 22–32)
Calcium: 9.1 mg/dL (ref 8.9–10.3)
Chloride: 104 mmol/L (ref 98–111)
Creatinine, Ser: 0.75 mg/dL (ref 0.44–1.00)
GFR, Estimated: >60 mL/min (ref >60)
Glucose, Bld: 121 mg/dL — ABNORMAL HIGH (ref 70–99)
Potassium: 4 mmol/L (ref 3.5–5.1)
Sodium: 136 mmol/L (ref 135–145)

## 2020-11-22 LAB — CBC
HCT: 29.4 % — ABNORMAL LOW (ref 36.0–46.0)
Hemoglobin: 8.7 g/dL — ABNORMAL LOW (ref 12.0–15.0)
MCH: 24.2 pg — ABNORMAL LOW (ref 26.0–34.0)
MCHC: 29.6 g/dL — ABNORMAL LOW (ref 30.0–36.0)
MCV: 81.9 fL (ref 80.0–100.0)
Platelets: 435 10*3/uL — ABNORMAL HIGH (ref 150–400)
RBC: 3.59 MIL/uL — ABNORMAL LOW (ref 3.87–5.11)
RDW: 19 % — ABNORMAL HIGH (ref 11.5–15.5)
WBC: 5.3 10*3/uL (ref 4.0–10.5)
nRBC: 0 % (ref 0.0–0.2)

## 2020-11-22 LAB — HEPARIN LEVEL (UNFRACTIONATED): Heparin Unfractionated: 0.45 IU/mL (ref 0.30–0.70)

## 2020-11-22 MED ORDER — FENTANYL CITRATE (PF) 250 MCG/5ML IJ SOLN
INTRAMUSCULAR | Status: AC
  Filled 2020-11-22: qty 5

## 2020-11-22 MED ORDER — MIDAZOLAM HCL 2 MG/2ML IJ SOLN
INTRAMUSCULAR | Status: AC
  Filled 2020-11-22: qty 2

## 2020-11-22 MED ORDER — ACETAMINOPHEN 500 MG PO TABS
1000.0000 mg | ORAL_TABLET | Freq: Once | ORAL | Status: AC
  Administered 2020-11-22: 1000 mg via ORAL
  Filled 2020-11-22: qty 2

## 2020-11-22 MED ORDER — LACTATED RINGERS IV SOLN: INTRAVENOUS | Status: DC

## 2020-11-22 MED ORDER — PROPOFOL 10 MG/ML IV BOLUS
INTRAVENOUS | Status: AC
  Filled 2020-11-22: qty 20

## 2020-11-22 NOTE — H&P (Signed)
Patient name: Mercedes [name]    MRN: [id number]        DOB: [birth year]          Sex: female  HPI: Mercedes [name] is a 65 y.o. female, status post thrombectomy and revision of right femoral to below-knee popliteal bypass on November 02, 2020.  The graft was revised and extended down to the tibioperoneal trunk with an interposition 6 mm PTFE.  She was discharged to home 10 days ago.  A few days ago she began to start experiencing pain across her lower back extending across the left and right sides.  She also has some pain in her left above-knee amputation.  She also has pain extending from the right hip all the way down into the right foot.  She does not recall any trauma event.  She states she has been getting out of bed in the wheelchair every day.  The pain is fairly continuous.  It does not seem to be relieved by any certain position.  Other medical problems include hypertension hyperlipidemia and arthritis.  She also has a history of coronary artery disease.  These are all currently stable.  She is on aspirin and Eliquis.      Past Medical History:  Diagnosis Date  . Abnormal LFTs    a. in the past when on Lipitor  . Anemia    as a young woman  . Anxiety   . Arnold-Chiari malformation, type I (HCC)    MRI brain 02/2010  . CAD (coronary artery disease)    a. NSTEMI 07/2009: LHC - D2 40%, LAD irreg., EF 50% with apical AK (tako-tsubo CM);  b. Inf STEMI (04/2013): Tx Promus DES to CFX;  c. 08/2013 Lexi CL: No ischemia, dist ant/ap/inf-ap infarct, EF  d. 06/2016 NSTEMI with LM disease: s/p CABG on 07/17/2016 w/ LIMA-LAD, SVG-OM, and SVG-dRCA.  [name]. DEPRESSION   . Dizziness    ? CVA 01/2010 - carotid dopplers with no ICA stenosis  . GERD (gastroesophageal reflux disease)   . Headache   . 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 HYPERLIPIDEMIA   . HYPERTENSION   . MI (myocardial infarction) (HCC)   . PAD (peripheral artery disease) (HCC)     s/p L fem pop 11/2013 in setting of 1st toe osteo/gangrene  . Paroxysmal atrial fibrillation (HCC)    a. anticoagulated with Eliquis 10/2014  . 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 RA (rheumatoid arthritis) (HCC)    prior tx by Dr. Dareen PianoAnderson  . Sjogren's syndrome (HCC) 06/02/13   pt denies this (12/01/13)  . SLE (systemic lupus erythematosus) (HCC) dx 05/2013   follows with rheum Dareen Piano(Anderson)  . Tachy-brady syndrome (HCC)    a. s/p STJ Leadless pacemaker 11-04-2014 Dr Johney FrameAllred  . 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 Wears dentures   . Wears glasses         Past Surgical History:  Procedure Laterality Date  . ABDOMINAL AORTAGRAM N/A 11/24/2013   Procedure: ABDOMINAL AORTAGRAM WITH BILATERAL RUNOFF WITH POSSIBLE INTERVENTION;  Surgeon: Chuck Hinthristopher S Dickson, MD;  [facility];  Service: Cardiovascular;  Laterality: N/A;  . ABDOMINAL AORTOGRAM W/LOWER EXTREMITY N/A 04/25/2019   Procedure: ABDOMINAL AORTOGRAM W/LOWER EXTREMITY;  Surgeon: Sherren KernsFields, Berlyn Malina E, MD;  Location: [facility] INVASIVE CV LAB;  Service: Cardiovascular;  Laterality: N/A;  . ABDOMINAL  Laterality: Bilateral;  . ABDOMINAL AORTOGRAM W/LOWER EXTREMITY N/A 08/23/2020   Procedure: ABDOMINAL AORTOGRAM W/LOWER EXTREMITY;  Surgeon: Cain, Brandon Christopher, MD;  Location: [facility] INVASIVE CV LAB;  Service: Cardiovascular;  Laterality: N/A;  . ABDOMINAL AORTOGRAM W/LOWER EXTREMITY N/A 10/29/2020   Procedure: ABDOMINAL AORTOGRAM W/LOWER EXTREMITY;  Surgeon: Donato Studley E, MD;  Location: [facility] INVASIVE CV LAB;  Service: Cardiovascular;  Laterality: N/A;  . AMPUTATION Left 12/02/2013   Procedure: LEFT 1ST TOE AMPUTATION;  Surgeon: Sharanya Templin E Siddh Vandeventer, MD;  Location: [facility] OR;  Service: Vascular;   Laterality: Left;  . AMPUTATION Left 07/16/2019   Procedure: LEFT TRANSMETATARSAL AMPUTATION;  Surgeon: Duda, Marcus V, MD;  Location: [facility] OR;  Service: Orthopedics;  Laterality: Left;  . AMPUTATION Left 10/01/2019   Procedure: LEFT AMPUTATION ABOVE KNEE;  Surgeon: Carren Blakley E, MD;  Location: [facility] OR;  Service: Vascular;  Laterality: Left;  . AMPUTATION Right 08/31/2020   Procedure: Right first toe amputation;  Surgeon: Rogina Schiano E, MD;  Location: [facility] OR;  Service: Vascular;  Laterality: Right;  . APPLICATION OF WOUND VAC Left 11/03/2019   Procedure: Application Of Wound Vac, left lower extremity;  Surgeon: Khair Chasteen E, MD;  Location: [facility] OR;  Service: Vascular;  Laterality: Left;  . CARDIAC CATHETERIZATION N/A 07/12/2016   Procedure: Left Heart Cath and Coronary Angiography;  Surgeon: Thomas A Kelly, MD;  [facility]: [facility] CV LAB;  Service: Cardiovascular;  Laterality: N/A;  . CARDIAC CATHETERIZATION N/A 07/14/2016   Procedure: Intravascular Ultrasound/IVUS;  Surgeon: Christopher End, MD;  [facility]: [facility] LAB;  Service: Cardiovascular;  Laterality: N/A;  . COLONOSCOPY    . CORONARY ANGIOPLASTY  04/2013  . CORONARY ARTERY BYPASS GRAFT N/A 07/17/2016   Procedure: CORONARY ARTERY BYPASS GRAFTING (CABG)x3 using right greater saphenous vein harvested endoscopiclly and left internal mammary artery;  Surgeon: Peter Van Trigt, MD;  Location: [facility] OR;  Service: Open Heart Surgery;  Laterality: N/A;  . ENDARTERECTOMY FEMORAL Left 05/12/2019   Procedure: LEFT Femoral Endarterectomy;  Surgeon: Mai Longnecker E, MD;  Location: [facility] OR;  Service: Vascular;  Laterality: Left;  . FEMORAL-POPLITEAL BYPASS GRAFT Left 12/02/2013   Procedure:  LEFT FEMORAL-BELOW KNEE POPLITEAL ARTERY BYPASS GRAFT;  Surgeon: Lenetta Piche E Rebbie Lauricella, MD;  Location: [facility];  Service: Vascular;  Laterality: Left;  . FEMORAL-POPLITEAL BYPASS GRAFT Left 09/25/2019   Procedure: THROMBECTOMY and PROPATEN BYPASS  FEMORAL TO BELOW KNEE  POPLITEAL ARTERY BYPASS GRAFT LEFT LEG;  Surgeon: Early, Todd F, MD;  [facility]: [facility];  Service: Vascular;  Laterality: Left;  . FEMORAL-POPLITEAL BYPASS GRAFT Right 08/24/2020   Procedure: RIGHT FEMORAL-BELOW KNEE POPLITEAL ARTERY BYPASS USING GORE PROPATEN GRAFT;  Surgeon: Cain, Brandon Christopher, MD;  [facility];  Service: Vascular;  Laterality: Right;  . FEMORAL-POPLITEAL BYPASS GRAFT Right 11/01/2020   Procedure: Thrombectomy and Revision of Right Femoral to below knee Bypass with Interpostional graft to Tibial / peroneal Trunk and Endarterectomy Tibial /Peroneal Trunk.;  Surgeon: Annajulia Lewing E, MD;  [facility]: [facility];  Service: Vascular;  Laterality: Right;  . INTRAOPERATIVE ARTERIOGRAM Right 11/01/2020   Procedure: ANTEGRADE RIGHT LOWER EXTREMITY INTRA OPERATIVE ARTERIOGRAM;  Surgeon: Alaynna Kerwood E, MD;  [facility];  Service: Vascular;  Laterality: Right;  . LEFT HEART CATHETERIZATION WITH CORONARY ANGIOGRAM N/A 05/19/2013   Procedure: LEFT HEART CATHETERIZATION WITH CORONARY ANGIOGRAM;  Surgeon: Michael D Cooper, MD;  [facility] CATH [facility];  Service: Cardiovascular;  Laterality: N/A;  . LEFT HEART CATHETERIZATION WITH CORONARY ANGIOGRAM N/A 10/28/2014     Procedure: LEFT HEART CATHETERIZATION WITH CORONARY ANGIOGRAM;  Surgeon: Muhammad A Arida, MD;  [facility]ATH [facility];  Service: Cardiovascular;  Laterality: N/A;  . LOWER EXTREMITY ANGIOGRAM Left 05/12/2019   Procedure: Lower Extremity Angiogram;  Surgeon: Porschia Willbanks E, MD;  [facility]: [facility];  Service: Vascular;  Laterality: Left;  . PATCH ANGIOPLASTY Left 05/12/2019   Procedure: Left Femoral Patch Angioplasty USING CADAVERIC SAPHENOUS VEIN;  Surgeon: Lorik Guo E, MD;  [facility];  Service: Vascular;  Laterality: Left;  . PERCUTANEOUS CORONARY STENT INTERVENTION (PCI-S)  05/19/2013   Procedure: PERCUTANEOUS CORONARY STENT INTERVENTION (PCI-S);  Surgeon: Michael D Cooper, MD;  [facility]: [facility]ATH [facility];  Service:  Cardiovascular;;  . PERIPHERAL VASCULAR INTERVENTION Right 10/29/2020   Procedure: PERIPHERAL VASCULAR INTERVENTION;  Surgeon: Jaiveer Panas E, MD;  [facility];  Service: Cardiovascular;  Laterality: Right;  common iliac  . PERMANENT PACEMAKER INSERTION N/A 11/04/2014   STJ Leadless pacemaker implanted by Dr Allred for tachy/brady  . TEE WITHOUT CARDIOVERSION N/A 07/17/2016   Procedure: TRANSESOPHAGEAL ECHOCARDIOGRAM (TEE);  Surgeon: Peter Van Trigt, MD;  [facility];  Service: Open Heart Surgery;  Laterality: N/A;  . THROMBECTOMY FEMORAL ARTERY Left 05/12/2019   Procedure: Left Ilio-Femoral Artery and Femoral-popliteal Graft Thrombectomy;  Surgeon: Caley Ciaramitaro E, MD;  [facility];  Service: Vascular;  Laterality: Left;  . THROMBECTOMY OF BYPASS GRAFT FEMORAL- POPLITEAL ARTERY Right 11/01/2020   Procedure: POSSIBLE THROMBOLYSIS VERSUS OPEN THROMBECTOMY AND REVSION OF RIGHT FEMORAL-POPLITEAL ARTERY BYPASS;  Surgeon: Ladean Steinmeyer E, MD;  [facility];  Service: Vascular;  Laterality: Right;  . TUBAL LIGATION    . VISCERAL ANGIOGRAPHY N/A 04/25/2019   Procedure: MESENTRIC  ANGIOGRAPHY;  Surgeon: Ysidro Ramsay E, MD;  [facility];  Service: Cardiovascular;  Laterality: N/A;  . WOUND DEBRIDEMENT Left 11/03/2019   Procedure: Debridement Wound of left above the knee amputation;  Surgeon: Goldy Calandra E, MD;  [facility]: [facility];  Service: Vascular;  Laterality: Left;    Family History  Problem Relation Age of Onset  . Arthritis Mother   . Emphysema Mother   . Arthritis Father   . COPD Father   . Heart disease Father   . Diabetes Paternal Aunt        paternal Aunts  . Heart disease Paternal Aunt   . Thyroid disease Neg Hx     SOCIAL HISTORY: Social History   Socioeconomic History  . Marital status: Single    Spouse name: Not on file  . Number of children: 3  . Years of education: Not on file  . Highest education level: Not on file   Occupational History  . Occupation: retired  Tobacco Use  . Smoking status: Former Smoker    Types: Cigarettes    Quit date: 2019    Years since quitting: 3.2  . Smokeless tobacco: Never Used  . Tobacco comment:    Vaping Use  . Vaping Use: Never used  Substance and Sexual Activity  . Alcohol use: No    Alcohol/week: 0.0 standard drinks  . Drug use: Not Currently    Types: Marijuana, Other-see comments    Comment:  and CBD oil  last use of both was 10/29/19  . Sexual activity: Not on file  Other Topics Concern  . Not on file  Social History Narrative   Lives alone.   Social Determinants of Health   Financial Resource Strain: Low Risk   . Difficulty of Paying   on file  Other Topics Concern  . Not on file  Social History Narrative   Lives alone.   Social Determinants of Health      Financial Resource Strain: Low Risk   . Difficulty of Paying Living Expenses: Not hard at all  Food Insecurity: No Food Insecurity  . Worried About Programme researcher, broadcasting/film/videounning Out of Food in the Last Year: Never true  . Ran Out of Food in the Last Year: Never true  Transportation Needs: No Transportation Needs  . Lack of Transportation (Medical): No  . Lack of Transportation (Non-Medical): No  Physical Activity: Inactive  . Days of Exercise per Week: 0 days  . Minutes of Exercise per Session: 0 min  Stress: No Stress Concern Present  . Feeling of Stress : Not at all  Social Connections: Unknown  . Frequency of Communication with Friends and Family: More than three times a week  . Frequency of Social Gatherings with Friends and Family: Once a week  . Attends Religious Services: Patient refused  . Active Member of Clubs or Organizations: Patient refused  . Attends BankerClub or Organization Meetings: Patient refused  . Marital Status: Never married  Intimate Partner Violence: Not At Risk  . Fear of Current or Ex-Partner: No  . Emotionally Abused: No  . Physically Abused: No  . Sexually Abused: No         Allergies  Allergen Reactions  . Brilinta [Ticagrelor]  Anaphylaxis, Shortness Of Breath and Other (See Comments)    Also, chest tightness  . Latex Rash  . Tape Rash          Current Outpatient Medications  Medication Sig Dispense Refill  . acetaminophen (TYLENOL) 500 MG tablet Take 500-1,000 mg by mouth every 8 (eight) hours as needed for mild pain or headache.    [name]. aspirin EC 81 MG EC tablet Take 1 tablet (81 mg total) by mouth daily at 6 (six) AM. Swallow whole.    [name]. ELIQUIS 5 MG TABS tablet TAKE 1 TABLET BY MOUTH TWICE DAILY (Patient taking differently: Take 5 mg by mouth 2 (two) times daily.) 180 tablet 1  . Ensure Max Protein (ENSURE MAX PROTEIN) LIQD Take 8 oz by mouth daily.    [name]. ezetimibe (ZETIA) 10 MG tablet Take 1 tablet (10 mg total) by mouth daily. 90 tablet 3  . ferrous sulfate 325 (65 FE) MG tablet Take 1 tablet (325 mg total) by mouth 3 (three) times daily with meals. 90 tablet 1  . gabapentin (NEURONTIN) 300 MG capsule Take 1 capsule (300 mg total) by mouth 2 (two) times daily. 60 capsule 3  . metoprolol tartrate (LOPRESSOR) 25 MG tablet Take 0.5 tablets (12.5 mg total) by mouth 2 (two) times daily. 180 tablet 3  . Multiple Vitamin (MULTIVITAMIN WITH MINERALS) TABS tablet Take 1 tablet by mouth daily.    . nitroGLYCERIN (NITROSTAT) 0.4 MG SL tablet Place 0.4 mg under the tongue every 4 (four) hours as needed for chest pain.    . rosuvastatin (CRESTOR) 40 MG tablet Take 40 mg by mouth at bedtime.    [name]. oxyCODONE-acetaminophen (PERCOCET/ROXICET) 5-325 MG tablet Take 1 tablet by mouth every 6 (six) hours as needed for moderate pain. (Patient not taking: Reported on 11/18/2020) 20 tablet 0   No current facility-administered medications for this visit.    ROS:   General:  No weight loss, Fever, chills  HEENT: No recent headaches, no nasal bleeding, no visual changes, no sore throat  Neurologic: No dizziness,  No history of rest pain in feet.  No history of claudication.  No history of non-healing ulcer, No history of DVT   Pulmonary: No home oxygen, no productive cough, no hemoptysis,  No asthma or wheezing  Musculoskeletal:  [X] Arthritis, [X] Low back pain,  [X] Joint pain  Hematologic:No history of hypercoagulable state.  No history of easy bleeding.  No history of anemia  Gastrointestinal: No hematochezia or melena,  No gastroesophageal reflux, no trouble swallowing  Urinary: [ ] chronic Kidney disease, [ ] on HD - [ ] MWF or [ ] TTHS, [ ] Burning with urination, [ ] Frequent urination, [ ] Difficulty urinating;   Skin: No rashes  Psychological: No history of anxiety,  No history of depression   Physical Examination  Vitals:   11/18/20 1441  BP: (!) 159/133  Pulse: 65  Resp: 20  Temp: 98.3 F (36.8 C)  SpO2: 96%  Weight: 89 lb (40.4 kg)  Height: 5' 5" (1.651 m)    Body mass index is 14.81 kg/m.  General:  Alert and oriented, seems to be in pain moving around frequently HEENT: Normal Neck: No JVD Pulmonary: Clear to auscultation bilaterally Cardiac: Regular Rate and Rhythm Abdomen: Soft, non-tender, non-distended Skin: No rash Extremity Pulses:  2+ radial, brachial, unable to palpate femoral pulses secondary to pain right groin incision healing no erythema no drainage femoral, absent right dorsalis pedis, posterior tibial pulses she does have a monophasic peroneal and posterior tibial Doppler signal in the right foot.  She has multiple blisters on the right plantar aspect of the foot and early gangrenous changes of the right fifth toe.  These are all similar to at the time of discharge.   Musculoskeletal: No deformity or edema, well-healed left above-knee amputation Neurologic: Upper and lower extremity motor 5/5 and symmetric  ASSESSMENT: Back hip lower extremity pain of unknown etiology.  She does have  some Doppler flow to her right foot although it is monophasic.  I am not sure if her graft is occluded but most likely it is patent since she does have some Doppler flow.  I am not really sure what is causing all of her pain.   PLAN: We will admit to  Hospital for further evaluation of her right leg bypass as well as further work-up for her pain.  Plan will be to place her on IV pain medication while her work-up is in progress.  She is not a candidate for further revascularizations of her bypass.  However, I do not believe her constellations of symptoms are all completely related to this.  We will continue her Eliquis for now.  We will get a graft duplex of her right leg bypass as well as a right hip x-ray and lumbar spine x-ray   Verdelle Valtierra, MD [facility] [place] Office: [phone]   

## 2020-11-22 NOTE — Anesthesia Preprocedure Evaluation (Addendum)
Anesthesia Evaluation  Patient identified by MRN, date of birth, ID band Patient awake    Reviewed: Allergy & Precautions, NPO status , Patient's Chart, lab work & pertinent test results  Airway Mallampati: II  TM Distance: >3 FB Neck ROM: Full    Dental  (+) Dental Advisory Given   Pulmonary former smoker,    breath sounds clear to auscultation       Cardiovascular hypertension, Pt. on medications and Pt. on home beta blockers + CAD, + Past MI, + CABG and + Peripheral Vascular Disease  + dysrhythmias Atrial Fibrillation + pacemaker  Rhythm:Regular Rate:Normal     Neuro/Psych  Headaches, Seizures -,   Neuromuscular disease    GI/Hepatic Neg liver ROS, GERD  ,  Endo/Other  diabetes, Type 2  Renal/GU negative Renal ROS     Musculoskeletal  (+) Arthritis ,   Abdominal   Peds  Hematology  (+) anemia ,   Anesthesia Other Findings   Reproductive/Obstetrics                             Lab Results  Component Value Date   WBC 5.3 11/22/2020   HGB 8.7 (L) 11/22/2020   HCT 29.4 (L) 11/22/2020   MCV 81.9 11/22/2020   PLT 435 (H) 11/22/2020   Lab Results  Component Value Date   CREATININE 0.75 11/22/2020   BUN 13 11/22/2020   NA 136 11/22/2020   K 4.0 11/22/2020   CL 104 11/22/2020   CO2 26 11/22/2020    Anesthesia Physical Anesthesia Plan  ASA: III  Anesthesia Plan: General   Post-op Pain Management:    Induction: Intravenous  PONV Risk Score and Plan: 3 and Ondansetron, Dexamethasone, Propofol infusion and Treatment may vary due to age or medical condition  Airway Management Planned: LMA  Additional Equipment:   Intra-op Plan:   Post-operative Plan: Extubation in OR  Informed Consent: I have reviewed the patients History and Physical, chart, labs and discussed the procedure including the risks, benefits and alternatives for the proposed anesthesia with the patient or  authorized representative who has indicated his/her understanding and acceptance.     Dental advisory given  Plan Discussed with: CRNA  Anesthesia Plan Comments: (Case originally scheduled yesterday and cancelled due to emergencies. No change in patient status overnight. No change to anesthetic plan.)       Anesthesia Quick Evaluation

## 2020-11-22 NOTE — Progress Notes (Signed)
Pt wishes to reschedule until tomorrow  NPO p midnight Can eat today and restart all meds  Fabienne Bruns, MD [facility] [place] Office: [phone]

## 2020-11-22 NOTE — Progress Notes (Signed)
Spoke with pt this morning.  She wishes to proceed with left AKA Keep NPO OR later today  Fabienne Bruns, MD [facility] [place] Office: [phone]

## 2020-11-22 NOTE — Interval H&P Note (Signed)
History and Physical Interval Note:  11/22/2020 9:56 AM  Mercedes [name]  has presented today for surgery, with the diagnosis of peripheral vascular disease.  The various methods of treatment have been discussed with the patient and family. After consideration of risks, benefits and other options for treatment, the patient has consented to  Procedure(s): AMPUTATION ABOVE KNEE (Right) as a surgical intervention.  The patient's history has been reviewed, patient examined, no change in status, stable for surgery.  I have reviewed the patient's chart and labs.  Questions were answered to the patient's satisfaction.     Fabienne Bruns

## 2020-11-22 NOTE — Patient Outreach (Signed)
Aging [facility] Program  11/22/2020  Mercedes [name] Mar 28, 1956 [id number]   Arrived for scheduled home visit and no answer at the door and no answer on the phone.  Reviewed electronic medical record and noted that patient is admitted to the hospital.  PLAN: Will call patient back in 1 week.   Rowe Pavy, RN, BSN, CEN [facility] [phone]

## 2020-11-22 NOTE — Progress Notes (Signed)
ANTICOAGULATION CONSULT NOTE - Follow Up  Pharmacy Consult for Heparin Indication: atrial fibrillation  Allergies  Allergen Reactions  . Brilinta [Ticagrelor] Anaphylaxis, Shortness Of Breath and Other (See Comments)    Also, chest tightness  . Latex Rash  . Tape Rash    Patient Measurements: Height: 5\' 5"  (165.1 cm) Weight: 46.8 kg (103 lb 2.8 oz) IBW/kg (Calculated) : 57 Heparin Dosing Weight: 40.4 kg  Vital Signs: Temp: 98.5 F (36.9 C) (03/28 0407) Temp Source: Oral (03/28 0407) BP: 110/67 (03/28 0407) Pulse Rate: 62 (03/28 0407)  Labs: Recent Labs    11/20/20 1012 11/21/20 0326 11/21/20 1405 11/22/20 0153 11/22/20 0159  HGB 9.0* 8.2*  --  8.7*  --   HCT 29.5* 27.3*  --  29.4*  --   PLT 412* 383  --  435*  --   APTT 29 43* 53* 82*  --   HEPARINUNFRC 0.20* 0.22* 0.28*  --  0.45  CREATININE  --   --   --  0.75  --     Estimated Creatinine Clearance: 51.1 mL/min (by C-G formula based on SCr of 0.75 mg/dL).   Medical History: Past Medical History:  Diagnosis Date  . Abnormal LFTs    a. in the past when on Lipitor  . Anemia    as a young woman  . Anxiety   . Arnold-Chiari malformation, type I (HCC)    MRI brain 02/2010  . CAD (coronary artery disease)    a. NSTEMI 07/2009: LHC - D2 40%, LAD irreg., EF 50% with apical AK (tako-tsubo CM);  b. Inf STEMI (04/2013): Tx Promus DES to CFX;  c. 08/2013 Lexi CL: No ischemia, dist ant/ap/inf-ap infarct, EF  d. 06/2016 NSTEMI with LM disease: s/p CABG on 07/17/2016 w/ LIMA-LAD, SVG-OM, and SVG-dRCA.  07/19/2016 DEPRESSION   . Dizziness    ? CVA 01/2010 - carotid dopplers with no ICA stenosis  . GERD (gastroesophageal reflux disease)   . Headache   . 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 HYPERLIPIDEMIA   . HYPERTENSION   . MI (myocardial infarction) (HCC)   . PAD (peripheral artery disease) (HCC)    s/p L fem pop 11/2013 in setting of 1st toe osteo/gangrene  . Paroxysmal atrial fibrillation  (HCC)    a. anticoagulated with Eliquis 10/2014  . 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 RA (rheumatoid arthritis) (HCC)    prior tx by Dr. 11/2014  . Sjogren's syndrome (HCC) 06/02/13   pt denies this (12/01/13)  . SLE (systemic lupus erythematosus) (HCC) dx 05/2013   follows with rheum 06/2013)  . Tachy-brady syndrome (HCC)    a. s/p STJ Leadless pacemaker 11-04-2014 Dr 01-04-2015  . 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 Wears dentures   . Wears glasses     Assessment: 65 yo female on apixaban PTA for atrial fibrillation, last dose 3/25 @ 0829. The patient is s/p thrombectomy and revision of R femoral to below-knee popliteal bypass on 3/8.The graft was revised and extended down to the tibioperoneal trunk with an interposition 6 mm PTFE. The right fem-pop bypass graft is now occluded. The patient is tenatively planned for right AKA on 3/28. Pharmacy is consulted to dose heparin while apixaban is held.   aPTT is at goal at 82, heparin level also at goal 0.45 with heparin running at 800 units/hr. Hgb  8.7, platelets 435. No issues with the infusion and the patient is without signs or symptoms of bleeding.  Aptt and heparin level correlating, will stop aptt checks.   Goal of Therapy:  Heparin level 0.3-0.7 units/ml aPTT 66-102 seconds Monitor platelets by anticoagulation protocol: Yes   Plan:  Continue heparin at 800 units/hr Obtain daily heparin level and CBC Monitor for signs and symptoms of bleeding  Sheppard Coil PharmD., BCPS Clinical Pharmacist 11/22/2020 11:18 AM  Please check [email or web address] for unit-specific pharmacy phone numbers.

## 2020-11-22 NOTE — Progress Notes (Signed)
Chaplain responded to patient's request for prayer on the consult list. Chaplain visited with patient this morning before she was taken to surgery. Chaplain offered prayer and spiritual support and is available when needed.    11/19/20 1027  Clinical Encounter Type  Visited With Patient  Visit Type Initial  Referral From Nurse  Consult/Referral To Chaplain  Spiritual Encounters  Spiritual Needs Prayer  Stress Factors  Patient Stress Factors Major life changes  Advance Directives (For Healthcare)  Does Patient Have a Medical Advance Directive? No  Would patient like information on creating a medical advance directive? No - Patient declined  Mental Health Advance Directives  Does Patient Have a Mental Health Advance Directive? No  Would patient like information on creating a mental health advance directive? No - Patient declined

## 2020-11-22 NOTE — Progress Notes (Signed)
ANTICOAGULATION CONSULT NOTE - Follow Up  Pharmacy Consult for Heparin Indication: atrial fibrillation  Allergies  Allergen Reactions  . Brilinta [Ticagrelor] Anaphylaxis, Shortness Of Breath and Other (See Comments)    Also, chest tightness  . Latex Rash  . Tape Rash    Patient Measurements: Height: 5\' 5"  (165.1 cm) Weight: 46.8 kg (103 lb 2.8 oz) IBW/kg (Calculated) : 57 Heparin Dosing Weight: 40.4 kg  Vital Signs: Temp: 98.1 F (36.7 C) (03/28 2003) Temp Source: Oral (03/28 2003) BP: 124/63 (03/28 2003) Pulse Rate: 65 (03/28 2003)  Labs: Recent Labs    11/20/20 1012 11/21/20 0326 11/21/20 1405 11/22/20 0153 11/22/20 0159  HGB 9.0* 8.2*  --  8.7*  --   HCT 29.5* 27.3*  --  29.4*  --   PLT 412* 383  --  435*  --   APTT 29 43* 53* 82*  --   HEPARINUNFRC 0.20* 0.22* 0.28*  --  0.45  CREATININE  --   --   --  0.75  --     Estimated Creatinine Clearance: 51.1 mL/min (by C-G formula based on SCr of 0.75 mg/dL).   Medical History: Past Medical History:  Diagnosis Date  . Abnormal LFTs    a. in the past when on Lipitor  . Anemia    as a young woman  . Anxiety   . Arnold-Chiari malformation, type I (HCC)    MRI brain 02/2010  . CAD (coronary artery disease)    a. NSTEMI 07/2009: LHC - D2 40%, LAD irreg., EF 50% with apical AK (tako-tsubo CM);  b. Inf STEMI (04/2013): Tx Promus DES to CFX;  c. 08/2013 Lexi CL: No ischemia, dist ant/ap/inf-ap infarct, EF  d. 06/2016 NSTEMI with LM disease: s/p CABG on 07/17/2016 w/ LIMA-LAD, SVG-OM, and SVG-dRCA.  07/19/2016 DEPRESSION   . Dizziness    ? CVA 01/2010 - carotid dopplers with no ICA stenosis  . GERD (gastroesophageal reflux disease)   . Headache   . 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 HYPERLIPIDEMIA   . HYPERTENSION   . MI (myocardial infarction) (HCC)   . PAD (peripheral artery disease) (HCC)    s/p L fem pop 11/2013 in setting of 1st toe osteo/gangrene  . Paroxysmal atrial fibrillation  (HCC)    a. anticoagulated with Eliquis 10/2014  . 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 RA (rheumatoid arthritis) (HCC)    prior tx by Dr. 11/2014  . Sjogren's syndrome (HCC) 06/02/13   pt denies this (12/01/13)  . SLE (systemic lupus erythematosus) (HCC) dx 05/2013   follows with rheum 06/2013)  . Tachy-brady syndrome (HCC)    a. s/p STJ Leadless pacemaker 11-04-2014 Dr 01-04-2015  . 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 Wears dentures   . Wears glasses     Assessment: 65 yo female on apixaban PTA for atrial fibrillation, last dose 3/25 @ 0829. The patient is s/p thrombectomy and revision of R femoral to below-knee popliteal bypass on 3/8.The graft was revised and extended down to the tibioperoneal trunk with an interposition 6 mm PTFE. The right fem-pop bypass graft is now occluded. The patient is tenatively planned for right AKA on 3/28. Pharmacy is consulted to dose heparin while apixaban is held.   Heparin was stopped for OR - but surgery was rescheduled for Tuesday. Dr. Saturday ok with resuming IV Heparin  this pm with plans to stop IV Heparin on-call tomorrow. Heparin level was therapeutic on 800 units/hr so will resume and recheck level in AM.   Goal of Therapy:  Heparin level 0.3-0.7 units/ml aPTT 66-102 seconds Monitor platelets by anticoagulation protocol: Yes   Plan:  Resume heparin at 800 units/hr Obtain daily heparin level and CBC Monitor for signs and symptoms of bleeding  Link Snuffer, PharmD, BCPS, BCCCP Clinical Pharmacist Please refer to [facility] for [facility] Pharmacy numbers 11/22/2020 9:19 PM  Please check [email or web address] for unit-specific pharmacy phone numbers.

## 2020-11-23 ENCOUNTER — Encounter (HOSPITAL_COMMUNITY)
Admission: AD | Disposition: A | Discharge status: Skilled Nursing Facility | Payer: Self-pay | Source: Ambulatory Visit | Attending: Vascular Surgery

## 2020-11-23 ENCOUNTER — Inpatient Hospital Stay (HOSPITAL_COMMUNITY): Payer: Medicare Other | Admitting: Anesthesiology

## 2020-11-23 ENCOUNTER — Encounter (HOSPITAL_COMMUNITY): Payer: Self-pay | Admitting: Vascular Surgery

## 2020-11-23 DIAGNOSIS — I998 Other disorder of circulatory system: Secondary | ICD-10-CM

## 2020-11-23 HISTORY — PX: AMPUTATION: SHX166

## 2020-11-23 LAB — CBC
HCT: 30.1 % — ABNORMAL LOW (ref 36.0–46.0)
Hemoglobin: 8.8 g/dL — ABNORMAL LOW (ref 12.0–15.0)
MCH: 23.9 pg — ABNORMAL LOW (ref 26.0–34.0)
MCHC: 29.2 g/dL — ABNORMAL LOW (ref 30.0–36.0)
MCV: 81.8 fL (ref 80.0–100.0)
Platelets: 399 10*3/uL (ref 150–400)
RBC: 3.68 MIL/uL — ABNORMAL LOW (ref 3.87–5.11)
RDW: 18.9 % — ABNORMAL HIGH (ref 11.5–15.5)
WBC: 5.6 10*3/uL (ref 4.0–10.5)
nRBC: 0 % (ref 0.0–0.2)

## 2020-11-23 LAB — HEPARIN LEVEL (UNFRACTIONATED)
Heparin Unfractionated: 0.23 IU/mL — ABNORMAL LOW (ref 0.30–0.70)
Heparin Unfractionated: 0.42 IU/mL (ref 0.30–0.70)

## 2020-11-23 LAB — PREPARE RBC (CROSSMATCH)

## 2020-11-23 LAB — GLUCOSE, CAPILLARY: Glucose-Capillary: 126 mg/dL — ABNORMAL HIGH (ref 70–99)

## 2020-11-23 SURGERY — AMPUTATION, ABOVE KNEE
Anesthesia: General | Site: Knee | Laterality: Right

## 2020-11-23 MED ORDER — ONDANSETRON HCL 4 MG/2ML IJ SOLN: 4.0000 mg | Freq: Once | INTRAMUSCULAR | Status: DC | PRN

## 2020-11-23 MED ORDER — CEFAZOLIN SODIUM-DEXTROSE 2-3 GM-%(50ML) IV SOLR
INTRAVENOUS | Status: DC | PRN
  Administered 2020-11-23: 2 g via INTRAVENOUS

## 2020-11-23 MED ORDER — CEFAZOLIN SODIUM 1 G IJ SOLR
INTRAMUSCULAR | Status: AC
  Filled 2020-11-23: qty 20

## 2020-11-23 MED ORDER — NALOXONE HCL 0.4 MG/ML IJ SOLN: 0.4000 mg | INTRAMUSCULAR | Status: DC | PRN

## 2020-11-23 MED ORDER — HEPARIN (PORCINE) 25000 UT/250ML-% IV SOLN: 800.0000 [IU]/h | INTRAVENOUS | Status: DC

## 2020-11-23 MED ORDER — HYDRALAZINE HCL 20 MG/ML IJ SOLN: 10.0000 mg | Freq: Once | INTRAMUSCULAR | Status: DC

## 2020-11-23 MED ORDER — HYDROMORPHONE HCL 1 MG/ML IJ SOLN
INTRAMUSCULAR | Status: AC
  Filled 2020-11-23: qty 1

## 2020-11-23 MED ORDER — OXYCODONE HCL 5 MG PO TABS
ORAL_TABLET | ORAL | Status: AC
  Filled 2020-11-23: qty 1

## 2020-11-23 MED ORDER — ALBUMIN HUMAN 5 % IV SOLN: INTRAVENOUS | Status: DC | PRN

## 2020-11-23 MED ORDER — AMISULPRIDE (ANTIEMETIC) 5 MG/2ML IV SOLN: 10.0000 mg | Freq: Once | INTRAVENOUS | Status: DC | PRN

## 2020-11-23 MED ORDER — DIPHENHYDRAMINE HCL 12.5 MG/5ML PO ELIX
12.5000 mg | ORAL_SOLUTION | Freq: Four times a day (QID) | ORAL | Status: DC | PRN
  Filled 2020-11-23: qty 5

## 2020-11-23 MED ORDER — HYDROMORPHONE 1 MG/ML IV SOLN
INTRAVENOUS | Status: DC
  Administered 2020-11-23: 1.6 mg via INTRAVENOUS
  Administered 2020-11-23: 0.4 mg via INTRAVENOUS
  Administered 2020-11-23: 0.9 mg via INTRAVENOUS
  Administered 2020-11-24: 0.6 mg via INTRAVENOUS
  Administered 2020-11-24: 0.8 mg via INTRAVENOUS

## 2020-11-23 MED ORDER — FENTANYL CITRATE (PF) 250 MCG/5ML IJ SOLN
INTRAMUSCULAR | Status: AC
  Filled 2020-11-23: qty 5

## 2020-11-23 MED ORDER — SODIUM CHLORIDE 0.9% FLUSH: 9.0000 mL | INTRAVENOUS | Status: DC | PRN

## 2020-11-23 MED ORDER — DEXAMETHASONE SODIUM PHOSPHATE 10 MG/ML IJ SOLN
INTRAMUSCULAR | Status: AC
  Filled 2020-11-23: qty 1

## 2020-11-23 MED ORDER — ONDANSETRON HCL 4 MG/2ML IJ SOLN
INTRAMUSCULAR | Status: AC
  Filled 2020-11-23: qty 2

## 2020-11-23 MED ORDER — PROPOFOL 500 MG/50ML IV EMUL
INTRAVENOUS | Status: DC | PRN
Start: 2020-11-23 — End: 2020-11-23
  Administered 2020-11-23: 25 ug/kg/min via INTRAVENOUS

## 2020-11-23 MED ORDER — 0.9 % SODIUM CHLORIDE (POUR BTL) OPTIME
TOPICAL | Status: DC | PRN
  Administered 2020-11-23: 1000 mL

## 2020-11-23 MED ORDER — PROPOFOL 10 MG/ML IV BOLUS
INTRAVENOUS | Status: AC
  Filled 2020-11-23: qty 20

## 2020-11-23 MED ORDER — MIDAZOLAM HCL 2 MG/2ML IJ SOLN
INTRAMUSCULAR | Status: AC
  Filled 2020-11-23: qty 2

## 2020-11-23 MED ORDER — SODIUM CHLORIDE 0.9% IV SOLUTION: Freq: Once | INTRAVENOUS | Status: DC

## 2020-11-23 MED ORDER — HYDROMORPHONE HCL 1 MG/ML IJ SOLN
0.2500 mg | INTRAMUSCULAR | Status: DC | PRN
  Administered 2020-11-23 (×7): 0.25 mg via INTRAVENOUS

## 2020-11-23 MED ORDER — EPHEDRINE SULFATE-NACL 50-0.9 MG/10ML-% IV SOSY
PREFILLED_SYRINGE | INTRAVENOUS | Status: DC | PRN
  Administered 2020-11-23: 5 mg via INTRAVENOUS
  Administered 2020-11-23: 10 mg via INTRAVENOUS
  Administered 2020-11-23 (×2): 5 mg via INTRAVENOUS

## 2020-11-23 MED ORDER — LIDOCAINE 2% (20 MG/ML) 5 ML SYRINGE
INTRAMUSCULAR | Status: AC
  Filled 2020-11-23: qty 5

## 2020-11-23 MED ORDER — FENTANYL CITRATE (PF) 250 MCG/5ML IJ SOLN
INTRAMUSCULAR | Status: DC | PRN
  Administered 2020-11-23 (×6): 25 ug via INTRAVENOUS

## 2020-11-23 MED ORDER — OXYCODONE HCL 5 MG PO TABS
5.0000 mg | ORAL_TABLET | Freq: Once | ORAL | Status: AC | PRN
Start: 2020-11-23 — End: 2020-11-23
  Administered 2020-11-23: 5 mg via ORAL

## 2020-11-23 MED ORDER — HYDRALAZINE HCL 20 MG/ML IJ SOLN
INTRAMUSCULAR | Status: AC
  Filled 2020-11-23: qty 1

## 2020-11-23 MED ORDER — POTASSIUM CHLORIDE CRYS ER 20 MEQ PO TBCR: 20.0000 meq | EXTENDED_RELEASE_TABLET | Freq: Every day | ORAL | Status: DC | PRN

## 2020-11-23 MED ORDER — CHLORHEXIDINE GLUCONATE 0.12 % MT SOLN
OROMUCOSAL | Status: AC
  Administered 2020-11-23: 15 mL
  Filled 2020-11-23: qty 15

## 2020-11-23 MED ORDER — HYDROMORPHONE 1 MG/ML IV SOLN
INTRAVENOUS | Status: AC
  Administered 2020-11-23: 30 mg
  Filled 2020-11-23: qty 30

## 2020-11-23 MED ORDER — DIPHENHYDRAMINE HCL 50 MG/ML IJ SOLN: 12.5000 mg | Freq: Four times a day (QID) | INTRAMUSCULAR | Status: DC | PRN

## 2020-11-23 MED ORDER — ROCURONIUM BROMIDE 10 MG/ML (PF) SYRINGE
PREFILLED_SYRINGE | INTRAVENOUS | Status: AC
  Filled 2020-11-23: qty 10

## 2020-11-23 MED ORDER — BACITRACIN ZINC 500 UNIT/GM EX OINT
TOPICAL_OINTMENT | CUTANEOUS | Status: DC | PRN
  Administered 2020-11-23: 1 via TOPICAL

## 2020-11-23 MED ORDER — DEXMEDETOMIDINE (PRECEDEX) IN NS 20 MCG/5ML (4 MCG/ML) IV SYRINGE
PREFILLED_SYRINGE | INTRAVENOUS | Status: AC
  Filled 2020-11-23: qty 5

## 2020-11-23 MED ORDER — PROPOFOL 10 MG/ML IV BOLUS
INTRAVENOUS | Status: DC | PRN
  Administered 2020-11-23: 40 mg via INTRAVENOUS
  Administered 2020-11-23: 140 mg via INTRAVENOUS
  Administered 2020-11-23: 20 mg via INTRAVENOUS

## 2020-11-23 MED ORDER — DEXAMETHASONE SODIUM PHOSPHATE 10 MG/ML IJ SOLN
INTRAMUSCULAR | Status: DC | PRN
  Administered 2020-11-23: 5 mg via INTRAVENOUS

## 2020-11-23 MED ORDER — MAGNESIUM SULFATE 2 GM/50ML IV SOLN: 2.0000 g | Freq: Every day | INTRAVENOUS | Status: DC | PRN

## 2020-11-23 MED ORDER — LIDOCAINE HCL (CARDIAC) PF 100 MG/5ML IV SOSY
PREFILLED_SYRINGE | INTRAVENOUS | Status: DC | PRN
  Administered 2020-11-23: 60 mg via INTRATRACHEAL

## 2020-11-23 MED ORDER — ONDANSETRON HCL 4 MG/2ML IJ SOLN
INTRAMUSCULAR | Status: DC | PRN
  Administered 2020-11-23: 4 mg via INTRAVENOUS

## 2020-11-23 MED ORDER — PHENYLEPHRINE 40 MCG/ML (10ML) SYRINGE FOR IV PUSH (FOR BLOOD PRESSURE SUPPORT)
PREFILLED_SYRINGE | INTRAVENOUS | Status: DC | PRN
  Administered 2020-11-23: 40 ug via INTRAVENOUS

## 2020-11-23 MED ORDER — SODIUM CHLORIDE 0.9 % IV SOLN: INTRAVENOUS | Status: DC

## 2020-11-23 MED ORDER — OXYCODONE HCL 5 MG/5ML PO SOLN
5.0000 mg | Freq: Once | ORAL | Status: AC | PRN
Start: 2020-11-23 — End: 2020-11-23

## 2020-11-23 MED ORDER — PROPOFOL 1000 MG/100ML IV EMUL
INTRAVENOUS | Status: AC
  Filled 2020-11-23: qty 100

## 2020-11-23 SURGICAL SUPPLY — 42 items
BLADE SAW RECIP 87.9 MT (BLADE) ×2 IMPLANT
BNDG COHESIVE 4X5 TAN STRL (GAUZE/BANDAGES/DRESSINGS) ×2 IMPLANT
BNDG COHESIVE 6X5 TAN STRL LF (GAUZE/BANDAGES/DRESSINGS) ×2 IMPLANT
BNDG ELASTIC 4X5.8 VLCR STR LF (GAUZE/BANDAGES/DRESSINGS) ×1 IMPLANT
BNDG ELASTIC 6X5.8 VLCR STR LF (GAUZE/BANDAGES/DRESSINGS) ×2 IMPLANT
BNDG GAUZE ELAST 4 BULKY (GAUZE/BANDAGES/DRESSINGS) ×2 IMPLANT
CANISTER SUCT 3000ML PPV (MISCELLANEOUS) ×2 IMPLANT
CLIP VESOCCLUDE MED 6/CT (CLIP) IMPLANT
COVER SURGICAL LIGHT HANDLE (MISCELLANEOUS) ×2 IMPLANT
COVER WAND RF STERILE (DRAPES) ×1 IMPLANT
DRAIN CHANNEL 19F RND (DRAIN) IMPLANT
DRAPE HALF SHEET 40X57 (DRAPES) ×2 IMPLANT
DRAPE ORTHO SPLIT 77X108 STRL (DRAPES) ×4
DRAPE SURG ORHT 6 SPLT 77X108 (DRAPES) ×2 IMPLANT
DRSG ADAPTIC 3X8 NADH LF (GAUZE/BANDAGES/DRESSINGS) ×1 IMPLANT
ELECT CAUTERY BLADE 6.4 (BLADE) ×2 IMPLANT
ELECT REM PT RETURN 9FT ADLT (ELECTROSURGICAL) ×2
ELECTRODE REM PT RTRN 9FT ADLT (ELECTROSURGICAL) ×1 IMPLANT
EVACUATOR SILICONE 100CC (DRAIN) IMPLANT
GAUZE SPONGE 4X4 12PLY STRL (GAUZE/BANDAGES/DRESSINGS) ×2 IMPLANT
GAUZE XEROFORM 5X9 LF (GAUZE/BANDAGES/DRESSINGS) ×1 IMPLANT
GLOVE BIO SURGEON STRL SZ7.5 (GLOVE) ×2 IMPLANT
GOWN STRL REUS W/ TWL LRG LVL3 (GOWN DISPOSABLE) ×3 IMPLANT
GOWN STRL REUS W/TWL LRG LVL3 (GOWN DISPOSABLE) ×6
KIT BASIN OR (CUSTOM PROCEDURE TRAY) ×2 IMPLANT
KIT TURNOVER KIT B (KITS) ×2 IMPLANT
NS IRRIG 1000ML POUR BTL (IV SOLUTION) ×2 IMPLANT
PACK GENERAL/GYN (CUSTOM PROCEDURE TRAY) ×2 IMPLANT
PAD ARMBOARD 7.5X6 YLW CONV (MISCELLANEOUS) ×4 IMPLANT
STAPLER VISISTAT 35W (STAPLE) ×1 IMPLANT
STOCKINETTE IMPERVIOUS LG (DRAPES) ×2 IMPLANT
SUT ETHILON 3 0 PS 1 (SUTURE) IMPLANT
SUT SILK 2 0 SH (SUTURE) IMPLANT
SUT SILK 2 0 SH CR/8 (SUTURE) ×3 IMPLANT
SUT SILK 2 0 TIES 10X30 (SUTURE) ×2 IMPLANT
SUT VIC AB 2-0 CT1 18 (SUTURE) IMPLANT
SUT VIC AB 2-0 SH 18 (SUTURE) ×2 IMPLANT
SUT VIC AB 3-0 SH 27 (SUTURE)
SUT VIC AB 3-0 SH 27X BRD (SUTURE) ×1 IMPLANT
TOWEL GREEN STERILE (TOWEL DISPOSABLE) ×4 IMPLANT
UNDERPAD 30X36 HEAVY ABSORB (UNDERPADS AND DIAPERS) ×2 IMPLANT
WATER STERILE IRR 1000ML POUR (IV SOLUTION) ×2 IMPLANT

## 2020-11-23 NOTE — Op Note (Signed)
Procedure: Right above-knee amputation  Preoperative diagnosis: Ischemia right leg  Postoperative diagnosis: Same  Anesthesia: General  Surgeon: Doreatha Massed, PA-C for assistance with exposure and expediting procedure  Operative details: After obtaining informed consent, the patient was taken to the operating room.  The patient was placed in supine position on the operating table.  After induction of general anesthesia and endotracheal intubation patient's right lower extremity was prepped and draped in usual sterile fashion.  Next circumferential incision was made on the right leg just above the knee.  The incision was carried down through the subcutaneous tissues down to the level of the fascia.  The fascia was incised as well as the muscle circumferentially.  The sartorius muscle was taken down.  Superficial femoral artery and vein were dissected free circumferentially underneath this and ligated and divided with silk ties.  Sciatic nerve was divided and ligated with a Vicryl tie and allowed to retract up into the leg.  There was an existing PTFE graft which was ring supported.  This was thrombosed.  This was divided high up in the leg and allowed to retract into the thigh.  The bone was then transected with a saw.  The leg was passed off the table as a specimen.  The wound was thoroughly irrigated normal saline solution.  Fascial edges were reapproximated using interrupted 2-0 Vicryl sutures.  The subcutaneous tissues were reapproximated using a running 3-0 Vicryl stitch.  The skin was closed with staples.  Fabienne Bruns, MD [facility] Office: [phone]

## 2020-11-23 NOTE — Progress Notes (Signed)
ANTICOAGULATION CONSULT NOTE - Follow Up  Pharmacy Consult for Heparin Indication: atrial fibrillation  Allergies  Allergen Reactions  . Brilinta [Ticagrelor] Anaphylaxis, Shortness Of Breath and Other (See Comments)    Also, chest tightness  . Latex Rash  . Tape Rash    Patient Measurements: Height: 5\' 5"  (165.1 cm) Weight: 46.8 kg (103 lb 2.8 oz) IBW/kg (Calculated) : 57 Heparin Dosing Weight: 40.4 kg  Vital Signs: Temp: 98 F (36.7 C) (03/29 0732) Temp Source: Oral (03/29 0732) BP: 147/83 (03/29 0330) Pulse Rate: 81 (03/29 0732)  Labs: Recent Labs    11/21/20 0326 11/21/20 1405 11/22/20 0153 11/22/20 0159 11/23/20 0105 11/23/20 0812  HGB 8.2*  --  8.7*  --  8.8*  --   HCT 27.3*  --  29.4*  --  30.1*  --   PLT 383  --  435*  --  399  --   APTT 43* 53* 82*  --   --   --   HEPARINUNFRC 0.22* 0.28*  --  0.45 0.23* 0.42  CREATININE  --   --  0.75  --   --   --     Estimated Creatinine Clearance: 51.1 mL/min (by C-G formula based on SCr of 0.75 mg/dL).   Medical History: Past Medical History:  Diagnosis Date  . Abnormal LFTs    a. in the past when on Lipitor  . Anemia    as a young woman  . Anxiety   . Arnold-Chiari malformation, type I (HCC)    MRI brain 02/2010  . CAD (coronary artery disease)    a. NSTEMI 07/2009: LHC - D2 40%, LAD irreg., EF 50% with apical AK (tako-tsubo CM);  b. Inf STEMI (04/2013): Tx Promus DES to CFX;  c. 08/2013 Lexi CL: No ischemia, dist ant/ap/inf-ap infarct, EF  d. 06/2016 NSTEMI with LM disease: s/p CABG on 07/17/2016 w/ LIMA-LAD, SVG-OM, and SVG-dRCA.  07/19/2016 DEPRESSION   . Dizziness    ? CVA 01/2010 - carotid dopplers with no ICA stenosis  . GERD (gastroesophageal reflux disease)   . Headache   . 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 HYPERLIPIDEMIA   . HYPERTENSION   . MI (myocardial infarction) (HCC)   . PAD (peripheral artery disease) (HCC)    s/p L fem pop 11/2013 in setting of 1st toe  osteo/gangrene  . Paroxysmal atrial fibrillation (HCC)    a. anticoagulated with Eliquis 10/2014  . 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 RA (rheumatoid arthritis) (HCC)    prior tx by Dr. 11/2014  . Sjogren's syndrome (HCC) 06/02/13   pt denies this (12/01/13)  . SLE (systemic lupus erythematosus) (HCC) dx 05/2013   follows with rheum 06/2013)  . Tachy-brady syndrome (HCC)    a. s/p STJ Leadless pacemaker 11-04-2014 Dr 01-04-2015  . 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 Wears dentures   . Wears glasses     Assessment: 65 yo female on apixaban PTA for atrial fibrillation, last dose 3/25 @ 0829. The patient is s/p thrombectomy and revision of R femoral to below-knee popliteal bypass on 3/8.The graft was revised and extended down to the tibioperoneal trunk with an interposition 6 mm PTFE. The right fem-pop bypass graft is now occluded. The patient is tenatively planned for right AKA on 3/29. Pharmacy is consulted to dose heparin while apixaban is held.  -  heparin level at goal -hg= 8.8  Goal of Therapy:  Heparin level 0.3-0.7 units/mL Monitor platelets by anticoagulation protocol: Yes   Plan:  Cont heparin at 800 units/hr Will follow plans post OR  Harland German, PharmD Clinical Pharmacist **Pharmacist phone directory can now be found on [email or web address] ([email or web address]).  Listed under [facility].

## 2020-11-23 NOTE — Progress Notes (Signed)
ANTICOAGULATION CONSULT NOTE - Follow Up Consult  Pharmacy Consult for IV Heparin Indication: atrial fibrillation  Allergies  Allergen Reactions  . Brilinta [Ticagrelor] Anaphylaxis, Shortness Of Breath and Other (See Comments)    Also, chest tightness  . Latex Rash  . Tape Rash    Patient Measurements: Height: 5\' 5"  (165.1 cm) Weight: 46.8 kg (103 lb 2.8 oz) IBW/kg (Calculated) : 57 Heparin Dosing Weight: 40.4 kg  Vital Signs: Temp: 97.4 F (36.3 C) (03/29 1644) Temp Source: Oral (03/29 1644) BP: 166/75 (03/29 1731) Pulse Rate: 75 (03/29 1644)  Labs: Recent Labs    11/21/20 0326 11/21/20 1405 11/22/20 0153 11/22/20 0159 11/23/20 0105 11/23/20 0812  HGB 8.2*  --  8.7*  --  8.8*  --   HCT 27.3*  --  29.4*  --  30.1*  --   PLT 383  --  435*  --  399  --   APTT 43* 53* 82*  --   --   --   HEPARINUNFRC 0.22* 0.28*  --  0.45 0.23* 0.42  CREATININE  --   --  0.75  --   --   --     Estimated Creatinine Clearance: 51.1 mL/min (by C-G formula based on SCr of 0.75 mg/dL).   Medical History: Past Medical History:  Diagnosis Date  . Abnormal LFTs    a. in the past when on Lipitor  . Anemia    as a young woman  . Anxiety   . Arnold-Chiari malformation, type I (HCC)    MRI brain 02/2010  . CAD (coronary artery disease)    a. NSTEMI 07/2009: LHC - D2 40%, LAD irreg., EF 50% with apical AK (tako-tsubo CM);  b. Inf STEMI (04/2013): Tx Promus DES to CFX;  c. 08/2013 Lexi CL: No ischemia, dist ant/ap/inf-ap infarct, EF  d. 06/2016 NSTEMI with LM disease: s/p CABG on 07/17/2016 w/ LIMA-LAD, SVG-OM, and SVG-dRCA.  07/19/2016 DEPRESSION   . Dizziness    ? CVA 01/2010 - carotid dopplers with no ICA stenosis  . GERD (gastroesophageal reflux disease)   . Headache   . 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 HYPERLIPIDEMIA   . HYPERTENSION   . MI (myocardial infarction) (HCC)   . PAD (peripheral artery disease) (HCC)    s/p L fem pop 11/2013 in setting of 1st  toe osteo/gangrene  . Paroxysmal atrial fibrillation (HCC)    a. anticoagulated with Eliquis 10/2014  . 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 RA (rheumatoid arthritis) (HCC)    prior tx by Dr. 11/2014  . Sjogren's syndrome (HCC) 06/02/13   pt denies this (12/01/13)  . SLE (systemic lupus erythematosus) (HCC) dx 05/2013   follows with rheum 06/2013)  . Tachy-brady syndrome (HCC)    a. s/p STJ Leadless pacemaker 11-04-2014 Dr 01-04-2015  . 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 Wears dentures   . Wears glasses     Assessment: 65 yr old female on apixaban PTA for atrial fibrillation, last dose 3/25 at 0829. Pt is S/P thrombectomy and revision of R femoral to below-knee popliteal bypass on 3/8; the right fem-pop bypass graft is now occluded.  Pt is S/P R AKA this afternoon. Pharmacy was consulted to dose heparin while apixaban on hold.  Heparin level was at goal (0.42 units/ml) on heparin infusion at 800 units/hr earlier today. H/H  8.8/30.1, plt 399.  Pharmacy is now consulted to resume heparin infusion at 0800 on 11/24/20.  Goal of Therapy:  Heparin level 0.3-0.7 units/mL Monitor platelets by anticoagulation protocol: Yes   Plan:  Resume heparin infusion at 800 units/hr at 0800 on 3/30 Check heparin level 6 hrs after restarting heparin infusion  Monitor daily heparin level, CBC Monitor for bleeding F/U plans to transition back to oral anticoagulant  Vicki Mallet, PharmD, BCPS, [facility] Clinical Pharmacist

## 2020-11-23 NOTE — Progress Notes (Signed)
Pt escorted to short stay on telemetry with RN at this time.  Versie Starks, RN

## 2020-11-23 NOTE — Transfer of Care (Signed)
Immediate Anesthesia Transfer of Care Note  Patient: Mercedes [name]  Procedure(s) Performed: RIGHT AMPUTATION ABOVE KNEE (Right Knee)  Patient Location: PACU  Anesthesia Type:General  Level of Consciousness: drowsy and patient cooperative  Airway & Oxygen Therapy: Patient Spontanous Breathing and Patient connected to face mask oxygen  Post-op Assessment: Report given to RN and Post -op Vital signs reviewed and stable  Post vital signs: Reviewed  Last Vitals:  Vitals Value Taken Time  BP 176/93 11/23/20 1415  Temp 37.3 C 11/23/20 1415  Pulse 101 11/23/20 1420  Resp 17 11/23/20 1420  SpO2 97 % 11/23/20 1420  Vitals shown include unvalidated device data.  Last Pain:  Vitals:   11/23/20 0826  TempSrc:   PainSc: Asleep      Patients Stated Pain Goal: 0 (11/22/20 0824)  Complications: No complications documented.

## 2020-11-23 NOTE — Progress Notes (Signed)
ANTICOAGULATION CONSULT NOTE - Follow Up  Pharmacy Consult for Heparin Indication: atrial fibrillation  Allergies  Allergen Reactions  . Brilinta [Ticagrelor] Anaphylaxis, Shortness Of Breath and Other (See Comments)    Also, chest tightness  . Latex Rash  . Tape Rash    Patient Measurements: Height: 5\' 5"  (165.1 cm) Weight: 46.8 kg (103 lb 2.8 oz) IBW/kg (Calculated) : 57 Heparin Dosing Weight: 40.4 kg  Vital Signs: Temp: 98.3 F (36.8 C) (03/28 2327) Temp Source: Oral (03/28 2327) BP: 135/67 (03/28 2327) Pulse Rate: 66 (03/28 2327)  Labs: Recent Labs    11/21/20 0326 11/21/20 1405 11/22/20 0153 11/22/20 0159 11/23/20 0105  HGB 8.2*  --  8.7*  --  8.8*  HCT 27.3*  --  29.4*  --  30.1*  PLT 383  --  435*  --  399  APTT 43* 53* 82*  --   --   HEPARINUNFRC 0.22* 0.28*  --  0.45 0.23*  CREATININE  --   --  0.75  --   --     Estimated Creatinine Clearance: 51.1 mL/min (by C-G formula based on SCr of 0.75 mg/dL).   Medical History: Past Medical History:  Diagnosis Date  . Abnormal LFTs    a. in the past when on Lipitor  . Anemia    as a young woman  . Anxiety   . Arnold-Chiari malformation, type I (HCC)    MRI brain 02/2010  . CAD (coronary artery disease)    a. NSTEMI 07/2009: LHC - D2 40%, LAD irreg., EF 50% with apical AK (tako-tsubo CM);  b. Inf STEMI (04/2013): Tx Promus DES to CFX;  c. 08/2013 Lexi CL: No ischemia, dist ant/ap/inf-ap infarct, EF  d. 06/2016 NSTEMI with LM disease: s/p CABG on 07/17/2016 w/ LIMA-LAD, SVG-OM, and SVG-dRCA.  07/19/2016 DEPRESSION   . Dizziness    ? CVA 01/2010 - carotid dopplers with no ICA stenosis  . GERD (gastroesophageal reflux disease)   . Headache   . 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 HYPERLIPIDEMIA   . HYPERTENSION   . MI (myocardial infarction) (HCC)   . PAD (peripheral artery disease) (HCC)    s/p L fem pop 11/2013 in setting of 1st toe osteo/gangrene  . Paroxysmal atrial fibrillation  (HCC)    a. anticoagulated with Eliquis 10/2014  . 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 RA (rheumatoid arthritis) (HCC)    prior tx by Dr. 11/2014  . Sjogren's syndrome (HCC) 06/02/13   pt denies this (12/01/13)  . SLE (systemic lupus erythematosus) (HCC) dx 05/2013   follows with rheum 06/2013)  . Tachy-brady syndrome (HCC)    a. s/p STJ Leadless pacemaker 11-04-2014 Dr 01-04-2015  . 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 Wears dentures   . Wears glasses     Assessment: 65 yo female on apixaban PTA for atrial fibrillation, last dose 3/25 @ 0829. The patient is s/p thrombectomy and revision of R femoral to below-knee popliteal bypass on 3/8.The graft was revised and extended down to the tibioperoneal trunk with an interposition 6 mm PTFE. The right fem-pop bypass graft is now occluded. The patient is tenatively planned for right AKA on 3/28. Pharmacy is consulted to dose heparin while apixaban is held.   Heparin was stopped for OR - but surgery was rescheduled for Tuesday. Dr. Tuesday ok with resuming  IV Heparin this pm with plans to stop IV Heparin on-call tomorrow. Heparin level was therapeutic on 800 units/hr so will resume and recheck level in AM.   3/29 AM update:  Heparin level of 0.23 drawn too early, only 3 hours after heparin re-start  Goal of Therapy:  Heparin level 0.3-0.7 units/mL Monitor platelets by anticoagulation protocol: Yes   Plan:  Cont heparin at 800 units/hr Re-time heparin level for 0800  Abran Duke, PharmD, BCPS Clinical Pharmacist Phone: [phone]

## 2020-11-23 NOTE — Plan of Care (Signed)
  Problem: Clinical Measurements: Goal: Will remain free from infection Outcome: Progressing Goal: Respiratory complications will improve Outcome: Progressing Goal: Cardiovascular complication will be avoided Outcome: Progressing   

## 2020-11-23 NOTE — Interval H&P Note (Signed)
History and Physical Interval Note:  11/23/2020 12:00 PM  Mercedes [name]  has presented today for surgery, with the diagnosis of peripheral vascular disease.  The various methods of treatment have been discussed with the patient and family. After consideration of risks, benefits and other options for treatment, the patient has consented to  Procedure(s): AMPUTATION ABOVE KNEE (Right) as a surgical intervention.  The patient's history has been reviewed, patient examined, no change in status, stable for surgery.  I have reviewed the patient's chart and labs.  Questions were answered to the patient's satisfaction.     Fabienne Bruns

## 2020-11-23 NOTE — Progress Notes (Signed)
Pt arrived from PACU, VSS however, BP is elevated PRN provided, right leg dressing has ace wrap which is clean dry and intact. Call light in reach and PCA education provided.   Kalman Jewels, RN

## 2020-11-23 NOTE — Anesthesia Postprocedure Evaluation (Signed)
Anesthesia Post Note  Patient: Mercedes [name]  Procedure(s) Performed: RIGHT AMPUTATION ABOVE KNEE (Right Knee)     Patient location during evaluation: PACU Anesthesia Type: General Level of consciousness: awake and alert Pain management: pain level controlled Vital Signs Assessment: post-procedure vital signs reviewed and stable Respiratory status: spontaneous breathing, nonlabored ventilation and respiratory function stable Cardiovascular status: blood pressure returned to baseline and stable Postop Assessment: no apparent nausea or vomiting Anesthetic complications: no   No complications documented.  Last Vitals:  Vitals:   11/23/20 1655 11/23/20 1731  BP:  (!) 166/75  Pulse:    Resp: 12 15  Temp:    SpO2: 97% 96%    Last Pain:  Vitals:   11/23/20 1655  TempSrc:   PainSc: 6                  Lucretia Kern

## 2020-11-23 NOTE — Plan of Care (Signed)
?  Problem: Clinical Measurements: ?Goal: Will remain free from infection ?Outcome: Progressing ?  ?

## 2020-11-23 NOTE — Anesthesia Procedure Notes (Signed)
Procedure Name: LMA Insertion Date/Time: 11/23/2020 12:51 PM Performed by: Audie Pinto, CRNA Pre-anesthesia Checklist: Patient identified, Emergency Drugs available, Suction available and Patient being monitored Patient Re-evaluated:Patient Re-evaluated prior to induction Oxygen Delivery Method: Circle system utilized Preoxygenation: Pre-oxygenation with 100% oxygen Induction Type: IV induction LMA: LMA inserted LMA Size: 3.0 Placement Confirmation: positive ETCO2 Dental Injury: Teeth and Oropharynx as per pre-operative assessment

## 2020-11-24 ENCOUNTER — Encounter (HOSPITAL_COMMUNITY): Payer: Self-pay | Admitting: Vascular Surgery

## 2020-11-24 LAB — CBC
HCT: 26 % — ABNORMAL LOW (ref 36.0–46.0)
Hemoglobin: 7.7 g/dL — ABNORMAL LOW (ref 12.0–15.0)
MCH: 23.8 pg — ABNORMAL LOW (ref 26.0–34.0)
MCHC: 29.6 g/dL — ABNORMAL LOW (ref 30.0–36.0)
MCV: 80.2 fL (ref 80.0–100.0)
Platelets: 417 10*3/uL — ABNORMAL HIGH (ref 150–400)
RBC: 3.24 MIL/uL — ABNORMAL LOW (ref 3.87–5.11)
RDW: 18.8 % — ABNORMAL HIGH (ref 11.5–15.5)
WBC: 7.3 10*3/uL (ref 4.0–10.5)
nRBC: 0 % (ref 0.0–0.2)

## 2020-11-24 LAB — BASIC METABOLIC PANEL
Anion gap: 7 (ref 5–15)
BUN: 13 mg/dL (ref 8–23)
CO2: 25 mmol/L (ref 22–32)
Calcium: 9.3 mg/dL (ref 8.9–10.3)
Chloride: 101 mmol/L (ref 98–111)
Creatinine, Ser: 0.72 mg/dL (ref 0.44–1.00)
GFR, Estimated: >60 mL/min (ref >60)
Glucose, Bld: 132 mg/dL — ABNORMAL HIGH (ref 70–99)
Potassium: 4.3 mmol/L (ref 3.5–5.1)
Sodium: 133 mmol/L — ABNORMAL LOW (ref 135–145)

## 2020-11-24 MED ORDER — APIXABAN 5 MG PO TABS
5.0000 mg | ORAL_TABLET | Freq: Two times a day (BID) | ORAL | Status: DC
  Administered 2020-11-24 – 2020-11-26 (×5): 5 mg via ORAL
  Filled 2020-11-24 (×5): qty 1

## 2020-11-24 MED ORDER — OXYCODONE-ACETAMINOPHEN 5-325 MG PO TABS
1.0000 | ORAL_TABLET | ORAL | Status: DC | PRN
  Administered 2020-11-24 – 2020-11-26 (×12): 2 via ORAL
  Filled 2020-11-24 (×12): qty 2

## 2020-11-24 MED ORDER — MORPHINE SULFATE (PF) 2 MG/ML IV SOLN
1.0000 mg | INTRAVENOUS | Status: DC | PRN
  Administered 2020-11-24 – 2020-11-25 (×3): 1 mg via INTRAVENOUS
  Filled 2020-11-24 (×4): qty 1

## 2020-11-24 NOTE — Progress Notes (Signed)
Chaplain followed up on patient after second leg amputation done yesterday. She was in great spirits and was visiting with her brother. She is relieved that her surgery is finally over and she is ready to "get on with it!" Chaplain is available when needed.    11/24/20 1500  Clinical Encounter Type  Visited With Patient  Visit Type Follow-up  Referral From Nurse  Consult/Referral To Chaplain  Spiritual Encounters  Spiritual Needs Prayer;Emotional  Stress Factors  Patient Stress Factors Major life changes

## 2020-11-24 NOTE — TOC Transition Note (Signed)
Transition of Care [facility]) - CM/SW Discharge Note   Patient Details  Name: Mercedes [name] MRN: [id number] Date of Birth: 26-Oct-1955  Transition of Care [facility]) CM/SW Contact:  Eduard Roux, LCSW Phone Number: 11/24/2020, 10:07 AM   Clinical Narrative:     CSW visit with patient at bedside. CSW introduced self and explained role. CSW discussed short term rehab at SNF if recommended by PT/OT. Patient acknowledges the need for rehab and is agreeable to rehab. She states she lives home alone. Her goal is to "walk". Patient expressed some interest in inpatient rehab but it is agreeable to start the SNF process.CSW explained the SNF process. Patient states she has not received covid vaccine. Patient states no questions or concerns at this time.   CSW will continue to follow and assist with discharge planning. CSW will follow for PT/OT recommendations   CSW will provide bed offers once available.  Antony Blackbird, MSW, LCSW Clinical Social Worker     Barriers to Discharge: Continued Medical Work up   Patient Goals and CMS Choice        Discharge Placement                       Discharge Plan and Services In-house Referral: Clinical Social Work                                   Social Determinants of Health (SDOH) Interventions     Readmission Risk Interventions Readmission Risk Prevention Plan 09/08/2020 10/06/2019 05/16/2019  Transportation Screening Complete Complete Complete  PCP or Specialist Appt within 5-7 Days Complete Complete Complete  Home Care Screening Complete Complete Complete  Medication Review (RN CM) Complete Complete Complete  Some recent data might be hidden

## 2020-11-24 NOTE — Progress Notes (Signed)
Inpatient Rehab Admissions Coordinator:   CIR consult received. PT is recommending SNF and Pt. Is min guard with bed mobility and transfers. She does not appear to have functional need for CIR level therapies at this time. In addition, with current payor trends, Pt.'s insurer [facility]) is unlikely to approve CIR admission for an amputation. I will not pursue CIR admission at this time.   Megan Salon, MS, CCC-SLP Rehab Admissions Coordinator  [phone] (celll) [phone] (office)

## 2020-11-24 NOTE — Progress Notes (Addendum)
  Progress Note    11/24/2020 7:44 AM 1 Day Post-Op  Subjective:  Feels better since ace wrap was loosened  Tm 99 HR 70's-60's NSR 130's-170's systolic 99% RA  Vitals:   11/24/20 0411 11/24/20 0418  BP: (!) 155/74   Pulse: 63   Resp:  15  Temp: 99 F (37.2 C)   SpO2: 97% 96%    Physical Exam: Incisions:  Bandage in place and clean and dry   CBC    Component Value Date/Time   WBC 7.3 11/24/2020 0301   RBC 3.24 (L) 11/24/2020 0301   HGB 7.7 (L) 11/24/2020 0301   HGB 14.2 10/09/2018 1125   HCT 26.0 (L) 11/24/2020 0301   HCT 44.6 10/09/2018 1125   PLT 417 (H) 11/24/2020 0301   PLT 289 10/09/2018 1125   MCV 80.2 11/24/2020 0301   MCV 79 10/09/2018 1125   MCH 23.8 (L) 11/24/2020 0301   MCHC 29.6 (L) 11/24/2020 0301   RDW 18.8 (H) 11/24/2020 0301   RDW 14.8 10/09/2018 1125   LYMPHSABS 1.7 08/27/2020 0056   MONOABS 0.4 08/27/2020 0056   EOSABS 0.4 08/27/2020 0056   BASOSABS 0.0 08/27/2020 0056    BMET    Component Value Date/Time   NA 133 (L) 11/24/2020 0301   NA 135 10/09/2018 1125   K 4.3 11/24/2020 0301   CL 101 11/24/2020 0301   CO2 25 11/24/2020 0301   GLUCOSE 132 (H) 11/24/2020 0301   BUN 13 11/24/2020 0301   BUN 10 10/09/2018 1125   CREATININE 0.72 11/24/2020 0301   CREATININE 1.13 (H) 08/03/2016 1440   CALCIUM 9.3 11/24/2020 0301   GFRNONAA >60 11/24/2020 0301   GFRAA >60 11/24/2019 0620    INR    Component Value Date/Time   INR 1.0 11/18/2019 1844     Intake/Output Summary (Last 24 hours) at 11/24/2020 0744 Last data filed at 11/23/2020 2251 Gross per 24 hour  Intake 1856.14 ml  Output 100 ml  Net 1756.14 ml     Assessment/Plan:  65 y.o. female is s/p right above knee amputation  1 Day Post-Op  -pt pain better since ace wrap loosened.  Bandage is clean and dry.  Will take this down tomorrow. -pt would like to dc PCA and start oral pain medication as she feels this worked better.    Doreatha Massed, PA-C [facility] [phone] 11/24/2020 7:44 AM   Agree with above Acute blood loss anemia  Asymptomatic  Transfuse if less than 7 or symptoms Change dressing tomorrow OT PT Rehab to see Resume oral anticoagulation and d/c heparin  Fabienne Bruns, MD [facility] [place] Office: [phone]

## 2020-11-24 NOTE — Progress Notes (Signed)
Mobility Specialist: Progress Note   11/24/20 1628  Mobility  Activity Ambulated in hall  Level of Assistance Contact guard assist, steadying assist  Assistive Device Wheelchair  Distance Ambulated (ft) 190 ft  Mobility Response Tolerated well  Mobility performed by Mobility specialist  $Mobility charge 1 Mobility   Post-Mobility: 69 HR, 100% SpO2  Pt c/o shoulder soreness towards end of ambulation. Pt was wheeled to [employer] and back to get out of the room for a little bit. Pt utilized sliding board to get back into bed from the [facility]. Pt experienced LOB when in the bed at end of the transfer and required minA to correct. Pt back in bed with call bell at her side.   [facility] Eladia Frame Mobility Specialist Mobility Specialist Phone: [phone]

## 2020-11-24 NOTE — Progress Notes (Signed)
Patient called this RN to the room stating that the PCA was not working. Her leg felt like it was swelling and causing the increase in pain. Femoral pulse + with doppler. Stump warm and dry. No drainage noted. Loosened the ace wrap, elevated the stump on a blanket and gave patient an ice pack.  Patient stated that it was feeling better. Patient is hesitant in pushing the button for the PCA, afraid that it will knock her out. Patient was reassured that as long as she kept the /CO2 in her nose, that would alert the staff if she was in any kind of trouble. Patient again stated that the PCA wasn't working for her.  Advised patient to talk with PA/MD on rounding to d/c PCA pump if indeed that was her decision. Patient stated that she did better with PO pain meds previously.   Possibly patient might benefit from PO pain meds and alternate with IV pain meds to use as a rescue or to alternate with PO??   Possibly muscle relaxer as well?

## 2020-11-24 NOTE — Progress Notes (Signed)
25mg  dilaudid from PCA wasted in stericycle with .   Lona Kettle, RN

## 2020-11-24 NOTE — Evaluation (Signed)
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  PMHx, L AKA 2021, takotsubo CM, SLE, RA, PAD, MI, HTN  Clinical Impression  Pt admitted with/for ischemic R LE, s/p AKA.  Initial evaluation limited to transition to sitting and scooting, pt needing min guard assist.  Expect pt to need moderate assist moving OOB..  Pt currently limited functionally due to the problems listed. ( See problems list.)   Pt will benefit from PT to maximize function and safety in order to get ready for next venue listed below.     Follow Up Recommendations SNF;Supervision/Assistance - 24 hour    Equipment Recommendations  Wheelchair (measurements PT);Wheelchair cushion (measurements PT) (uprgrade her w/c and or cushion for longer times of use.)    Recommendations for Other Services       Precautions / Restrictions Precautions Precautions: Fall Precaution Comments: bil AKA Other Brace: L prosthetic - has only worn it twice, is at home Restrictions Weight Bearing Restrictions: No RLE Weight Bearing: Non weight bearing LLE Weight Bearing: Non weight bearing      Mobility  Bed Mobility Overal bed mobility: Needs Assistance Bed Mobility: Supine to Sit;Sit to Supine     Supine to sit: Min guard Sit to supine: Min guard   General bed mobility comments: slow and guarded.  Pt able to use abdominals and UE's to come up and forward to upright sitting without assist.    Transfers Overall transfer level: Needs assistance Equipment used: None Transfers: Licensed conveyancer transfers: Min guard   General transfer comment: pt boosted herself posteriorly up toward [facility] without assist.  Ambulation/Gait                Stairs            Wheelchair  Mobility    Modified Rankin (Stroke Patients Only)       Balance Overall balance assessment: Needs assistance Sitting-balance support: No upper extremity supported;Bilateral upper extremity supported Sitting balance-Leahy Scale: Fair Sitting balance - Comments: able to sitting up in the bed without UEassist, though balance otherwise not challenged.                                     Pertinent Vitals/Pain Pain Assessment: 0-10 Pain Score: 9  Pain Location: right residual limb surgical site,  phantom pain Pain Descriptors / Indicators: Burning;Discomfort;Sharp Pain Intervention(s): Monitored during session    Home Living Family/patient expects to be discharged to:: Skilled nursing facility Living Arrangements: Alone Available Help at Discharge: Personal care attendant Type of Home: House Home Access: Ramped entrance     Home Layout: One level Home Equipment: Environmental consultant - 2 wheels;Cane - single point;Tub bench;Bedside commode;Hand held shower head;Adaptive equipment;Wheelchair - manual Additional Comments: aide 2hrs /day, 7 days/wk otherwise is alone; PT coming out 2 days/wk Frances Furbish)    Prior Function Level of Independence: Independent with assistive device(s)         Comments: Useing wheelchair more lately due to R leg pain.  Cares for self at wheelcahir level. Drove.  Was able to transfer to tub seat for bathing and dressing.  Had no assist with meals or light home management.     Hand  Dominance   Dominant Hand: Right    Extremity/Trunk Assessment   Upper Extremity Assessment Upper Extremity Assessment: Overall WFL for tasks assessed    Lower Extremity Assessment Lower Extremity Assessment: RLE deficits/detail;LLE deficits/detail RLE Deficits / Details: moves actively in all functional directions stiffly. RLE: Unable to fully assess due to pain RLE Coordination: decreased fine motor LLE Deficits / Details: moves functionally in full range.  Still a  little tender. LLE Coordination: decreased fine motor    Cervical / Trunk Assessment Cervical / Trunk Assessment: Normal  Communication   Communication: No difficulties  Cognition Arousal/Alertness: Awake/alert Behavior During Therapy: WFL for tasks assessed/performed Overall Cognitive Status: Within Functional Limits for tasks assessed                                        General Comments General comments (skin integrity, edema, etc.): vss on RA,  Pt has experienced some phantom pain on great toe on the R.  Presently 9/10 pain, but pt able to participate.    Exercises     Assessment/Plan    PT Assessment Patient needs continued PT services  PT Problem List Decreased activity tolerance;Decreased mobility;Decreased balance;Pain;Decreased strength;Decreased range of motion       PT Treatment Interventions DME instruction;Functional mobility training;Therapeutic activities;Therapeutic exercise;Balance training;Patient/family education    PT Goals (Current goals can be found in the Care Plan section)  Acute Rehab PT Goals Patient Stated Goal: agreed we need to be Independent at a w/c level.  Wants to walk. PT Goal Formulation: With patient Time For Goal Achievement: 12/08/20 Potential to Achieve Goals: Fair    Frequency Min 3X/week   Barriers to discharge Decreased caregiver support      Co-evaluation               AM-PAC PT "6 Clicks" Mobility  Outcome Measure Help needed turning from your back to your side while in a flat bed without using bedrails?: A Little Help needed moving from lying on your back to sitting on the side of a flat bed without using bedrails?: A Little Help needed moving to and from a bed to a chair (including a wheelchair)?: A Lot Help needed standing up from a chair using your arms (e.g., wheelchair or bedside chair)?: A Lot Help needed to walk in hospital room?: Total Help needed climbing 3-5 steps with a railing? :  Total 6 Click Score: 12    End of Session   Activity Tolerance: Patient tolerated treatment well;Patient limited by pain Patient left: in bed;with call bell/phone within reach Nurse Communication: Mobility status PT Visit Diagnosis: Other abnormalities of gait and mobility (R26.89);Pain Pain - Right/Left: Right Pain - part of body: Leg    Time: 0177-9390 PT Time Calculation (min) (ACUTE ONLY): 27 min   Charges:   PT Evaluation $PT Eval Moderate Complexity: 1 Mod PT Treatments $Therapeutic Activity: 8-22 mins        11/24/2020  Jacinto Halim., PT Acute Rehabilitation Services [phone]  (pager) [phone]  (office)  [name] Zarius Furr 11/24/2020, 11:06 AM

## 2020-11-24 NOTE — Evaluation (Signed)
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  PMHx, L AKA 2021, takotsubo CM, SLE, RA, PAD, MI, HTN   Clinical Impression   Pt admitted with above. She demonstrates the below listed deficits and will benefit from continued OT to maximize safety and independence with BADLs.  Pt presents to OT with pain, impaired balance, decreased activity tolerance.  She currently requires set up - mod A for ADLs, and is able to perform bed mobility and EOB sitting with supervision.  Deferred OOB at this time as she reports pain meds just taking effect and she is finally getting comfortable.  She reports she lives alone and was mod I with ADLs and IADLs PTA.  Recommend SNF level rehab.       Follow Up Recommendations  SNF    Equipment Recommendations  None recommended by OT (will need eventual w/c eval)    Recommendations for Other Services       Precautions / Restrictions Precautions Precautions: Fall Precaution Comments: bil AKA Other Brace: Has Lt prosthesis at home      Mobility Bed Mobility Overal bed mobility: Needs Assistance Bed Mobility: Supine to Sit;Sit to Supine     Supine to sit: Supervision Sit to supine: Supervision        Transfers Overall transfer level: Needs assistance               General transfer comment: did not attempt this date due to increased pain and pain meds just beginning to take effect    Balance Overall balance assessment: Needs assistance Sitting-balance support: No upper extremity supported;Bilateral upper extremity supported Sitting balance-Leahy Scale: Fair Sitting balance - Comments: pt able to maintain EOB sitting with supervision                                   ADL either performed or assessed with clinical  judgement   ADL Overall ADL's : Needs assistance/impaired Eating/Feeding: Independent   Grooming: Set up;Sitting   Upper Body Bathing: Set up;Sitting   Lower Body Bathing: Minimal assistance;Sitting/lateral leans   Upper Body Dressing : Min guard;Sitting   Lower Body Dressing: Moderate assistance;Sitting/lateral leans;Bed level   Toilet Transfer: Total assistance Toilet Transfer Details (indicate cue type and reason): not assessed this date due to pain Toileting- Clothing Manipulation and Hygiene: Moderate assistance;Bed level;Sitting/lateral lean       Functional mobility during ADLs: Supervision/safety (bed mobility)       Vision         Perception     Praxis      Pertinent Vitals/Pain Pain Assessment: Faces Faces Pain Scale: Hurts little more Pain Location: right residual limb surgical site,  phantom pain Pain Descriptors / Indicators: Burning;Discomfort;Sharp Pain Intervention(s): Monitored during session     Hand Dominance Right   Extremity/Trunk Assessment         Cervical / Trunk Assessment Cervical / Trunk Assessment: Normal   Communication Communication Communication: No difficulties   Cognition Arousal/Alertness: Awake/alert Behavior During Therapy: WFL for tasks assessed/performed Overall Cognitive Status: Within Functional Limits for tasks assessed  General Comments  discussed area resources with pt such as Independent Living, Amputee support group, SCAT, etc    Exercises     Shoulder Instructions      Home Living Family/patient expects to be discharged to:: Skilled nursing facility Living Arrangements: Alone Available Help at Discharge: Personal care attendant Type of Home: House Home Access: Ramped entrance     Home Layout: One level     Bathroom Shower/Tub: Chief Strategy Officer: Standard Bathroom Accessibility: Yes How Accessible: Accessible via  wheelchair Home Equipment: Walker - 2 wheels;Cane - single point;Tub bench;Bedside commode;Hand held shower head;Adaptive equipment;Wheelchair - Radiographer, therapeutic Comments: aide 2hrs /day, 7 days/wk otherwise is alone; PT coming out 2 days/wk Frances Furbish)      Prior Functioning/Environment Level of Independence: Independent with assistive device(s)        Comments: Pt reports she was mod I with ADLs and IADLs at w/c level. She was able to drive        OT Problem List: Decreased safety awareness;Decreased knowledge of use of DME or AE;Impaired sensation;Pain;Impaired balance (sitting and/or standing)      OT Treatment/Interventions: Self-care/ADL training;Therapeutic exercise;DME and/or AE instruction;Therapeutic activities;Patient/family education;Balance training    OT Goals(Current goals can be found in the care plan section) Acute Rehab OT Goals Patient Stated Goal: to be able to take care of self and hopefully walk OT Goal Formulation: With patient Time For Goal Achievement: 12/08/20 Potential to Achieve Goals: Good  OT Frequency: Min 2X/week   Barriers to D/C:            Co-evaluation              AM-PAC OT "6 Clicks" Daily Activity     Outcome Measure Help from another person eating meals?: None Help from another person taking care of personal grooming?: A Little Help from another person toileting, which includes using toliet, bedpan, or urinal?: A Lot Help from another person bathing (including washing, rinsing, drying)?: A Little Help from another person to put on and taking off regular upper body clothing?: A Little Help from another person to put on and taking off regular lower body clothing?: A Lot 6 Click Score: 17   End of Session Nurse Communication: Mobility status  Activity Tolerance: Patient tolerated treatment well Patient left: in bed;with call bell/phone within reach;with nursing/sitter in room  OT Visit Diagnosis:  Other abnormalities of gait and mobility (R26.89);Muscle weakness (generalized) (M62.81);Pain Pain - Right/Left: Right Pain - part of body: Leg                Time: 2956-2130 OT Time Calculation (min): 44 min Charges:  OT General Charges $OT Visit: 1 Visit OT Evaluation $OT Eval Moderate Complexity: 1 Mod OT Treatments $Self Care/Home Management : 8-22 mins $Therapeutic Activity: 8-22 mins  Eber Jones., OTR/L Acute Rehabilitation Services Pager [phone] Office [phone]   Jeani Hawking M 11/24/2020, 5:46 PM

## 2020-11-25 LAB — BASIC METABOLIC PANEL
Anion gap: 6 (ref 5–15)
BUN: 10 mg/dL (ref 8–23)
CO2: 25 mmol/L (ref 22–32)
Calcium: 8.8 mg/dL — ABNORMAL LOW (ref 8.9–10.3)
Chloride: 103 mmol/L (ref 98–111)
Creatinine, Ser: 0.69 mg/dL (ref 0.44–1.00)
GFR, Estimated: >60 mL/min (ref >60)
Glucose, Bld: 108 mg/dL — ABNORMAL HIGH (ref 70–99)
Potassium: 4.5 mmol/L (ref 3.5–5.1)
Sodium: 134 mmol/L — ABNORMAL LOW (ref 135–145)

## 2020-11-25 LAB — CBC
HCT: 24.8 % — ABNORMAL LOW (ref 36.0–46.0)
Hemoglobin: 7.4 g/dL — ABNORMAL LOW (ref 12.0–15.0)
MCH: 23.8 pg — ABNORMAL LOW (ref 26.0–34.0)
MCHC: 29.8 g/dL — ABNORMAL LOW (ref 30.0–36.0)
MCV: 79.7 fL — ABNORMAL LOW (ref 80.0–100.0)
Platelets: 342 10*3/uL (ref 150–400)
RBC: 3.11 MIL/uL — ABNORMAL LOW (ref 3.87–5.11)
RDW: 18.6 % — ABNORMAL HIGH (ref 11.5–15.5)
WBC: 6.5 10*3/uL (ref 4.0–10.5)
nRBC: 0 % (ref 0.0–0.2)

## 2020-11-25 NOTE — Progress Notes (Signed)
Mobility Specialist: Progress Note   11/25/20 1557  Mobility  Activity  (Transferred to/from wheelchair)  Level of Assistance Contact guard assist, steadying assist  Assistive Device Wheelchair  Mobility Response Tolerated well  Mobility performed by Mobility specialist  $Mobility charge 1 Mobility   Pre-Mobility: 57 HR, 99% SpO2 Post-Mobility: 71 HR, 124/66 BP, 98% SpO2  Pt c/o pain in RLE during session, no rating given. Pt was able to transfer to/from bed to wheelchair with contact guard and set up cues. Offered pt to go into hallway but pt differed as her food tray had just arrived. Pt back to bed with call bell and phone at her side.   [facility] Madlynn Lundeen Mobility Specialist Mobility Specialist Phone: [phone]

## 2020-11-25 NOTE — Progress Notes (Addendum)
  Progress Note    11/25/2020 8:17 AM 2 Days Post-Op  Subjective:  Surgical site pain but she says it is better than yesterday   Vitals:   11/24/20 2309 11/25/20 0402  BP: (!) 146/63 110/66  Pulse: 64 62  Resp: 13 14  Temp: 98.5 F (36.9 C) 98.5 F (36.9 C)  SpO2: 98% 98%   Physical Exam: Cardiac:  regular Lungs: non labored Incisions: right AKA well approximated,staples intact. No drainage. No fluid collections or hematoma Extremities: well perfused and warm. Left AKA well healed Neurologic: alert and oriented  CBC    Component Value Date/Time   WBC 6.5 11/25/2020 0143   RBC 3.11 (L) 11/25/2020 0143   HGB 7.4 (L) 11/25/2020 0143   HGB 14.2 10/09/2018 1125   HCT 24.8 (L) 11/25/2020 0143   HCT 44.6 10/09/2018 1125   PLT 342 11/25/2020 0143   PLT 289 10/09/2018 1125   MCV 79.7 (L) 11/25/2020 0143   MCV 79 10/09/2018 1125   MCH 23.8 (L) 11/25/2020 0143   MCHC 29.8 (L) 11/25/2020 0143   RDW 18.6 (H) 11/25/2020 0143   RDW 14.8 10/09/2018 1125   LYMPHSABS 1.7 08/27/2020 0056   MONOABS 0.4 08/27/2020 0056   EOSABS 0.4 08/27/2020 0056   BASOSABS 0.0 08/27/2020 0056    BMET    Component Value Date/Time   NA 134 (L) 11/25/2020 0143   NA 135 10/09/2018 1125   K 4.5 11/25/2020 0143   CL 103 11/25/2020 0143   CO2 25 11/25/2020 0143   GLUCOSE 108 (H) 11/25/2020 0143   BUN 10 11/25/2020 0143   BUN 10 10/09/2018 1125   CREATININE 0.69 11/25/2020 0143   CREATININE 1.13 (H) 08/03/2016 1440   CALCIUM 8.8 (L) 11/25/2020 0143   GFRNONAA >60 11/25/2020 0143   GFRAA >60 11/24/2019 0620    INR    Component Value Date/Time   INR 1.0 11/18/2019 1844     Intake/Output Summary (Last 24 hours) at 11/25/2020 0817 Last data filed at 11/25/2020 0353 Gross per 24 hour  Intake 814 ml  Output 1500 ml  Net -686 ml     Assessment/Plan:  65 y.o. female is s/p right AKA  2 Days Post-Op. Doing well post op.  Pain is manageable. Staple line intact and well approximated. No  drainage. No appreciable fluid collections. Applied Xeroform and kerlix wrap and light ace per patient request. Really does not need to be dressed at this point. VSS. Hemoglobin stable. Continue to monitor. Continue PT/ OT. Recommending SNF   DVT prophylaxis:  Apixaban   Graceann Congress, PA-C [facility] [phone] 11/25/2020 8:17 AM    Agree with above Should be able to d/c tomorrow if pain controlled and bed available Cont PT OT  Fabienne Bruns, MD [facility] [place] Office: [phone]

## 2020-11-25 NOTE — TOC Progression Note (Signed)
Transition of Care [facility]) - Progression Note    Patient Details  Name: DENEA CHEANEY MRN: [id number] Date of Birth: [birth year]  [facility]) CM/SW Contact  Eduard Roux, [place] Phone Number: 11/25/2020, 4:16 PM  Clinical Narrative:     CSW visit with the patient at bedside. CSW informed of additional bed offers. Patient accepted bed offer for [facility]. SNF confirmed bed offers.   CSW started Fish farm manager # [phone] RN updated and covid test requested   CSW will continue to follow and assist with discharge planning  Antony Blackbird, MSW, LCSW Clinical Social Worker    Barriers to Discharge: Continued Medical Work up  [facility]   In-house Referral: Clinical Social Work     Living arrangements for the past 2 months: Single Family Home                                       Social Determinants of Health (SDOH) Interventions    Readmission Risk Interventions Readmission Risk Prevention Plan 09/08/2020 10/06/2019 05/16/2019  Transportation Screening Complete Complete Complete  PCP or Specialist Appt within 5-7 Days Complete Complete Complete  Home Care Screening Complete Complete Complete  Medication Review (RN CM) Complete Complete Complete  Some recent data might be hidden

## 2020-11-25 NOTE — Progress Notes (Signed)
Patient had called out for pain medicine around 2220. Patient was crying and writhing in pain. Patient was rocking back and forth and hitting her head on the rail of the bed. Patient seemed to be a bit out of control d/t the pain. Patient stated that she told someone around 2000 that she needed something for pain".  This RN was unaware that she requested pain medicine at that time.   Patient raised her voice at the staff when this RN was trying to get to her to take her scheduled meds as well as the pain medicine.  Patient needed the PW and bed pad replaced but she told NT that she didn't have time for that right now.   Patient given pain meds (see MAR). Patient advised that her pain meds were "as needed" and she needed to call for them. Patient was instructed not to wait until the pain becomes unbearable to ask for the pain meds. This RN told the patient that she needs to stay ahead of the pain. Patient stated that, "she thought we were ahead of the pain". Informed the patient that the pain can sneak up on her and it makes it harder to get the pain under control.   Patient voiced understanding of what she needs to do to attempt to keep the pain at a minimum in the future. Will continue to monitor

## 2020-11-25 NOTE — TOC Progression Note (Signed)
Transition of Care [facility]) - Progression Note    Patient Details  Name: Mercedes [name] MRN: [id number] Date of Birth: 06-01-1956  [facility]) CM/SW Contact  Eduard Roux, [place] Phone Number: 11/25/2020, 12:03 PM  Clinical Narrative:     [facility]- declined- unable to meet her needs [facility]-declined- patient not vaccinated [facility]-under review  CSW will continue to follow and assist with discharge planning.  Antony Blackbird, MSW, LCSW Clinical Social Worker     Barriers to Discharge: Continued Medical Work up  Expected Discharge Plan and Services   In-house Referral: Clinical Social Work     Living arrangements for the past 2 months: Single Family Home                                       Social Determinants of Health (SDOH) Interventions    Readmission Risk Interventions Readmission Risk Prevention Plan 09/08/2020 10/06/2019 05/16/2019  Transportation Screening Complete Complete Complete  PCP or Specialist Appt within 5-7 Days Complete Complete Complete  Home Care Screening Complete Complete Complete  Medication Review (RN CM) Complete Complete Complete  Some recent data might be hidden

## 2020-11-25 NOTE — Progress Notes (Addendum)
Nutrition Follow Up  DOCUMENTATION CODES:   Severe malnutrition in context of chronic illness,Underweight  INTERVENTION:   No BM documented since 3/22? Discuss with nursing   Ensure Enlive po TID, each supplement provides 350 kcal and 20 grams of protein.  MVI daily    NUTRITION DIAGNOSIS:   Severe Malnutrition related to chronic illness (PAD s/p amputation) as evidenced by severe fat depletion,severe muscle depletion.  Ongoing  GOAL:   Patient will meet greater than or equal to 90% of their needs   Progressing   MONITOR:   PO intake,Weight trends,Supplement acceptance,Skin,Labs,I & O's  REASON FOR ASSESSMENT:   Malnutrition Screening Tool (Low BMI)    ASSESSMENT:   65 year old female with history of PAD, L AKA.  Pt status post thrombectomy and revision of right femoral to below-knee popliteal bypass on 11/02/2020. Presents with back and hip pain with plans for further evaluation of her right leg bypass.   03/29- s/p R AKA  Patient reports intake is progressing. Consumed 75% of breakfast this am. Last 5 meal completions charted as 50-80%. Taking Ensure 2-3 times daily. Likely d/c to SNF tomorrow.   UOP: 2100 ml x 24 hrs   Admission weight: 40.9 kg  Current weight: 46.8 kg   Medications: reviewed  Labs: Na 134 (L)   Diet Order:   Diet Order            Diet regular Room service appropriate? Yes; Fluid consistency: Thin  Diet effective now                 EDUCATION NEEDS:   Not appropriate for education at this time  Skin:  Skin Assessment: Skin Integrity Issues: Skin Integrity Issues:: Other (Comment),Incisions Incisions: groin Other: L AKA  Last BM:  3/22?  Height:   Ht Readings from Last 1 Encounters:  11/20/20 5\' 5"  (1.651 m)    Weight:   Wt Readings from Last 1 Encounters:  11/22/20 46.8 kg    Ideal Body Weight:  52.3 kg  BMI:  Body mass index is 17.17 kg/m.  Estimated Nutritional Needs:   Kcal:  1500-1700  Protein:  70-85  grams   Fluid:  >/= 1.5 L/day  11/24/20 RD, LDN Clinical Nutrition Pager listed in AMION

## 2020-11-25 NOTE — Discharge Summary (Incomplete)
[facility] Discharge Summary   Patient ID:  Mercedes [name] MRN: [id number] DOB/AGE: [birth year] 65 y.o.  Admit date: 11/18/2020 Discharge date: *** Date of Surgery: 11/23/2020 Surgeon: Surgeon(s): Sherren Kerns, MD  Admission Diagnosis: Ischemic rest pain of lower extremity [M79.606, I99.8]  Discharge Diagnoses:  Ischemic rest pain of lower extremity [M79.606, I99.8]  Secondary Diagnoses: Past Medical History:  Diagnosis Date  . Abnormal LFTs    a. in the past when on Lipitor  . Anemia    as a young woman  . Anxiety   . Arnold-Chiari malformation, type I (HCC)    MRI brain 02/2010  . CAD (coronary artery disease)    a. NSTEMI 07/2009: LHC - D2 40%, LAD irreg., EF 50% with apical AK (tako-tsubo CM);  b. Inf STEMI (04/2013): Tx Promus DES to CFX;  c. 08/2013 Lexi CL: No ischemia, dist ant/ap/inf-ap infarct, EF  d. 06/2016 NSTEMI with LM disease: s/p CABG on 07/17/2016 w/ LIMA-LAD, SVG-OM, and SVG-dRCA.  [name] DEPRESSION   . Dizziness    ? CVA 01/2010 - carotid dopplers with no ICA stenosis  . GERD (gastroesophageal reflux disease)   . Headache   . 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 HYPERLIPIDEMIA   . HYPERTENSION   . MI (myocardial infarction) (HCC)   . PAD (peripheral artery disease) (HCC)    s/p L fem pop 11/2013 in setting of 1st toe osteo/gangrene  . Paroxysmal atrial fibrillation (HCC)    a. anticoagulated with Eliquis 10/2014  . 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 RA (rheumatoid arthritis) (HCC)    prior tx by Dr. Dareen Piano  . Sjogren's syndrome (HCC) 06/02/13   pt denies this (12/01/13)  . SLE (systemic lupus erythematosus) (HCC) dx 05/2013   follows with rheum Dareen Piano)  . Tachy-brady syndrome (HCC)    a. s/p STJ Leadless pacemaker 11-04-2014 Dr Johney Frame  . 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 Wears  dentures   . Wears glasses     Procedure(s): RIGHT AMPUTATION ABOVE KNEE  Discharged Condition: stable  HPI: Mercedes [name] is a 65 y.o. female who presents with chief complaint of right lower extremity pain.  She is status post right common femoral endarterectomy and common femoral to below the knee popliteal artery bypass with PTFE by Dr. Randie Heinz on 08/24/2020.  Several days later she had a right great toe amputation.   Arterial duplex demonstrates an occluded femoropopliteal bypass She was scheduled for right AKA.  Hospital Course:  Mercedes [name] is a 65 y.o. female is S/P  Procedure(s): RIGHT AMPUTATION ABOVE KNEE The right AKA stump appears viable and healing well.   Anemia surgical blood loss asymptomatic stable at 7.4   Consults:    Significant Diagnostic Studies: CBC Lab Results  Component Value Date   WBC 6.5 11/25/2020   HGB 7.4 (L) 11/25/2020   HCT 24.8 (L) 11/25/2020   MCV 79.7 (L) 11/25/2020   PLT 342 11/25/2020    BMET    Component Value Date/Time   NA 134 (L) 11/25/2020 0143   NA 135 10/09/2018 1125   K 4.5 11/25/2020 0143   CL 103 11/25/2020 0143   CO2 25 11/25/2020 0143   GLUCOSE 108 (H) 11/25/2020 0143   BUN 10 11/25/2020 0143   BUN 10 10/09/2018 1125   CREATININE 0.69 11/25/2020 0143  CREATININE 1.13 (H) 08/03/2016 1440   CALCIUM 8.8 (L) 11/25/2020 0143   GFRNONAA >60 11/25/2020 0143   GFRAA >60 11/24/2019 0620   COAG Lab Results  Component Value Date   INR 1.0 11/18/2019   INR 1.0 11/03/2019   INR 1.1 10/01/2019     Disposition:  Discharge to :{Discharge Destination:18313::"Home"}  Allergies as of 11/25/2020      Reactions   Brilinta [ticagrelor] Anaphylaxis, Shortness Of Breath, Other (See Comments)   Also, chest tightness   Latex Rash   Tape Rash    Med Rec must be completed prior to using this [facility]***      Verbal and written Discharge instructions given to the patient. Wound care per Discharge  AVS   Signed: Mosetta Pigeon 11/25/2020, 12:10 PM

## 2020-11-25 NOTE — Progress Notes (Signed)
Occupational Therapy Treatment Patient Details Name: Mercedes [name] MRN: [id number] DOB: 1956-01-12 Today's Date: 11/25/2020    History of present illness pt is a 65 y/o female admitted 3/24 with a non-slvageable chronically ischemic R LE, s/p R AKA 3/29.  PMHx, L AKA 2021, takotsubo CM, SLE, RA, PAD, MI, HTN   OT comments  Pt provided with list of community resources such as Independent living and amputee support group.  Discussed what they potentially could provide to her.  She was very appreciative and discussed how difficult it was to navigate the first amputation without any resources - allowed her time to discuss the obstacles she experienced and how to problem solve through them.   Follow Up Recommendations  SNF    Equipment Recommendations  None recommended by OT    Recommendations for Other Services      Precautions / Restrictions Precautions Precautions: Fall Precaution Comments: bil AKA Required Braces or Orthoses: Other Brace Other Brace: Has Lt prosthesis at home       Mobility Bed Mobility                    Transfers                      Balance                                           ADL either performed or assessed with clinical judgement   ADL                                               Vision       Perception     Praxis      Cognition Arousal/Alertness: Awake/alert Behavior During Therapy: WFL for tasks assessed/performed Overall Cognitive Status: Within Functional Limits for tasks assessed                                 General Comments: grossly [facility]        Exercises Other Exercises Other Exercises: Pt provided with written info re: community resources such as amputee support group, independent living, questions to ask about wheelchairs, etc.  Reviewed this with her, and provided emotional support/therapeutic listening   Shoulder Instructions       General  Comments      Pertinent Vitals/ Pain       Pain Assessment: Faces Faces Pain Scale: Hurts a little bit Pain Location: right residual limb surgical site,  phantom pain Pain Descriptors / Indicators: Burning;Discomfort;Sharp Pain Intervention(s): Monitored during session  Home Living                                          Prior Functioning/Environment              Frequency  Min 2X/week        Progress Toward Goals  OT Goals(current goals can now be found in the care plan section)  Progress towards OT goals: Progressing toward goals     Plan Discharge plan remains appropriate  Co-evaluation                 AM-PAC OT "6 Clicks" Daily Activity     Outcome Measure   Help from another person eating meals?: None Help from another person taking care of personal grooming?: A Little Help from another person toileting, which includes using toliet, bedpan, or urinal?: A Lot Help from another person bathing (including washing, rinsing, drying)?: A Little Help from another person to put on and taking off regular upper body clothing?: A Little Help from another person to put on and taking off regular lower body clothing?: A Lot 6 Click Score: 17    End of Session    OT Visit Diagnosis: Other abnormalities of gait and mobility (R26.89);Muscle weakness (generalized) (M62.81);Pain Pain - Right/Left: Right Pain - part of body: Leg   Activity Tolerance Patient tolerated treatment well   Patient Left in bed;with call bell/phone within reach   Nurse Communication Mobility status        Time: 6269-4854 OT Time Calculation (min): 49 min  Charges: OT General Charges $OT Visit: 1 Visit OT Treatments $Self Care/Home Management : 38-52 mins  Eber Jones., OTR/L Acute Rehabilitation Services Pager [phone] Office [phone]    Jeani Hawking M 11/25/2020, 6:08 PM

## 2020-11-25 NOTE — TOC Progression Note (Signed)
Transition of Care [facility]) - Progression Note    Patient Details  Name: Mercedes [name] MRN: [id number] Date of Birth: 09/07/1955  Transition of Care [facility]) CM/SW Contact  Eduard Roux, [place] Phone Number: 11/25/2020, 10:24 AM  Clinical Narrative:     CSW visit with patient at bedside. CSW provided patient with bed offers.  Patient expressed displeasure with the bed choices provided. CSW informed of all SNFs that were included in search. Patient did not want CSW to SNF expand search, wants to stay in the [place] area.CSW contacted - [facility]- declined, not vaccinated [facility]- sent request to review-waiting on response [facility]- no availability [facility]-sent request to review- waiting on response [facility]-sent request to review- waiting on response  CSW will continue to follow and assist with discharge planning.  Antony Blackbird, MSW, LCSW Clinical Social Worker       Barriers to Discharge: Continued Medical Work up  Expected Discharge Plan and Services   In-house Referral: Clinical Social Work     Living arrangements for the past 2 months: Single Family Home                                       Social Determinants of Health (SDOH) Interventions    Readmission Risk Interventions Readmission Risk Prevention Plan 09/08/2020 10/06/2019 05/16/2019  Transportation Screening Complete Complete Complete  PCP or Specialist Appt within 5-7 Days Complete Complete Complete  Home Care Screening Complete Complete Complete  Medication Review (RN CM) Complete Complete Complete  Some recent data might be hidden

## 2020-11-25 NOTE — Plan of Care (Signed)
  Problem: Health Behavior/Discharge Planning: Goal: Ability to manage health-related needs will improve Outcome: Progressing   

## 2020-11-26 DIAGNOSIS — Z7401 Bed confinement status: Secondary | ICD-10-CM | POA: Diagnosis not present

## 2020-11-26 DIAGNOSIS — K219 Gastro-esophageal reflux disease without esophagitis: Secondary | ICD-10-CM | POA: Diagnosis not present

## 2020-11-26 DIAGNOSIS — I480 Paroxysmal atrial fibrillation: Secondary | ICD-10-CM | POA: Diagnosis not present

## 2020-11-26 DIAGNOSIS — M2550 Pain in unspecified joint: Secondary | ICD-10-CM | POA: Diagnosis not present

## 2020-11-26 DIAGNOSIS — R252 Cramp and spasm: Secondary | ICD-10-CM | POA: Diagnosis not present

## 2020-11-26 DIAGNOSIS — Z743 Need for continuous supervision: Secondary | ICD-10-CM | POA: Diagnosis not present

## 2020-11-26 DIAGNOSIS — M6281 Muscle weakness (generalized): Secondary | ICD-10-CM | POA: Diagnosis not present

## 2020-11-26 DIAGNOSIS — R5381 Other malaise: Secondary | ICD-10-CM | POA: Diagnosis not present

## 2020-11-26 DIAGNOSIS — D638 Anemia in other chronic diseases classified elsewhere: Secondary | ICD-10-CM | POA: Diagnosis not present

## 2020-11-26 DIAGNOSIS — I10 Essential (primary) hypertension: Secondary | ICD-10-CM | POA: Diagnosis not present

## 2020-11-26 DIAGNOSIS — Z89611 Acquired absence of right leg above knee: Secondary | ICD-10-CM | POA: Diagnosis not present

## 2020-11-26 DIAGNOSIS — I2510 Atherosclerotic heart disease of native coronary artery without angina pectoris: Secondary | ICD-10-CM | POA: Diagnosis not present

## 2020-11-26 DIAGNOSIS — D649 Anemia, unspecified: Secondary | ICD-10-CM | POA: Diagnosis not present

## 2020-11-26 DIAGNOSIS — I4891 Unspecified atrial fibrillation: Secondary | ICD-10-CM | POA: Diagnosis not present

## 2020-11-26 DIAGNOSIS — E785 Hyperlipidemia, unspecified: Secondary | ICD-10-CM | POA: Diagnosis not present

## 2020-11-26 DIAGNOSIS — I495 Sick sinus syndrome: Secondary | ICD-10-CM | POA: Diagnosis not present

## 2020-11-26 DIAGNOSIS — Z89612 Acquired absence of left leg above knee: Secondary | ICD-10-CM | POA: Diagnosis not present

## 2020-11-26 DIAGNOSIS — M069 Rheumatoid arthritis, unspecified: Secondary | ICD-10-CM | POA: Diagnosis not present

## 2020-11-26 DIAGNOSIS — G629 Polyneuropathy, unspecified: Secondary | ICD-10-CM | POA: Diagnosis not present

## 2020-11-26 DIAGNOSIS — I739 Peripheral vascular disease, unspecified: Secondary | ICD-10-CM | POA: Diagnosis not present

## 2020-11-26 DIAGNOSIS — I70201 Unspecified atherosclerosis of native arteries of extremities, right leg: Secondary | ICD-10-CM | POA: Diagnosis not present

## 2020-11-26 DIAGNOSIS — R2689 Other abnormalities of gait and mobility: Secondary | ICD-10-CM | POA: Diagnosis not present

## 2020-11-26 DIAGNOSIS — Z4781 Encounter for orthopedic aftercare following surgical amputation: Secondary | ICD-10-CM | POA: Diagnosis not present

## 2020-11-26 LAB — TYPE AND SCREEN
ABO/RH(D): B POS
Antibody Screen: NEGATIVE
Unit division: 0
Unit division: 0

## 2020-11-26 LAB — CBC
HCT: 25.8 % — ABNORMAL LOW (ref 36.0–46.0)
Hemoglobin: 7.7 g/dL — ABNORMAL LOW (ref 12.0–15.0)
MCH: 24.1 pg — ABNORMAL LOW (ref 26.0–34.0)
MCHC: 29.8 g/dL — ABNORMAL LOW (ref 30.0–36.0)
MCV: 80.9 fL (ref 80.0–100.0)
Platelets: 394 10*3/uL (ref 150–400)
RBC: 3.19 MIL/uL — ABNORMAL LOW (ref 3.87–5.11)
RDW: 18.6 % — ABNORMAL HIGH (ref 11.5–15.5)
WBC: 5.8 10*3/uL (ref 4.0–10.5)
nRBC: 0 % (ref 0.0–0.2)

## 2020-11-26 LAB — BPAM RBC
Blood Product Expiration Date: 202204142359
Blood Product Expiration Date: 202204192359
ISSUE DATE / TIME: 202203250732
ISSUE DATE / TIME: 202203250732
Unit Type and Rh: 7300
Unit Type and Rh: 7300

## 2020-11-26 LAB — SARS CORONAVIRUS 2 (TAT 6-24 HRS): SARS Coronavirus 2: NEGATIVE

## 2020-11-26 LAB — SURGICAL PATHOLOGY

## 2020-11-26 MED ORDER — OXYCODONE-ACETAMINOPHEN 5-325 MG PO TABS
1.0000 | ORAL_TABLET | Freq: Four times a day (QID) | ORAL | 0 refills | Status: DC | PRN
Start: 2020-11-26 — End: 2021-03-01

## 2020-11-26 NOTE — Discharge Instructions (Signed)
Incision Care, Adult An incision is a surgical cut that is made through your skin. Most incisions are closed after a surgical procedure. Your incision may be closed with stitches (sutures), staples, skin glue, or adhesive strips. You may need to return to your health care provider to have sutures or staples removed. This may occur several days or several weeks after your surgery. Until then, the incision needs to be cared for properly to prevent infection. Follow instructions from your health care provider about how to care for your incision. Supplies needed:  Soap, water, and a clean hand towel.  Wound cleanser.  A clean bandage (dressing), if needed.  Cream or ointment, if told by your health care provider.  Clean gauze. How to care for your incision Cleaning the incision Ask your health care provider how to clean the incision. This may include:  Using mild soap and water, or wound cleanser.  Using a clean gauze to pat the incision dry after cleaning it. Dressing changes  Wash your hands with soap and water for at least 20 seconds before and after you change the dressing. If soap and water are not available, use hand sanitizer.  Change your dressing as told by your health care provider.  Leave sutures, staples, skin glue, or adhesive strips in place. These skin closures may need to stay in place for 2 weeks or longer. If adhesive strip edges start to loosen and curl up, you may trim the loose edges. Do not remove adhesive strips completely unless your health care provider tells you to do that.  Apply cream or ointment. Do this only as told by your health care provider.  Cover the incision with a clean dressing. Ask your health care provider when you can begin leaving the incision uncovered. Checking for infection Check your incision area every day for signs of infection. Check for:  More redness, swelling, or pain.  More fluid or blood.  Warmth.  Pus or a bad smell.    Follow these instructions at home Medicines  Take over-the-counter and prescription medicines only as told by your health care provider.  If you were prescribed an antibiotic medicine, cream, or ointment, take or apply it as told by your health care provider. Do not stop using the antibiotic even if your condition improves. Eating and drinking  Eat a diet that includes protein, vitamin A, vitamin C, and other nutrient-rich foods to help the wound heal. ? Foods rich in protein include meat, fish, eggs, dairy, beans, and nuts. ? Foods rich in vitamin A include carrots and dark green, leafy vegetables. ? Foods rich in vitamin C include citrus fruits, tomatoes, broccoli, and peppers.  Drink enough fluid to keep your urine pale yellow. General instructions  Do not take baths, swim, use a hot tub, or do anything that would put the incision underwater until your health care provider approves. Ask your health care provider if you may take showers. You may only be allowed to take sponge baths.  Limit movement around your incision to promote healing. ? Avoid straining, lifting, or exercising for the first 2 weeks after your procedure, or for as long as told by your health care provider. ? Return to your normal activities as told by your health care provider. Ask your health care provider what activities are safe for you.  Do not scratch or pick at the incision. Keep it covered as told by your health care provider.  Protect your incision from the sun when you are   outside for the first 6 months, or for as long as told by your health care provider. Cover up the scar area or apply sunscreen that has an SPF of at least 30.  Do not use any products that contain nicotine or tobacco, such as cigarettes, e-cigarettes, and chewing tobacco. These can delay incision healing after surgery. If you need help quitting, ask your health care provider.  Keep all follow-up visits as told by your health care  provider. This is important.   Contact a health care provider if:  You have any of these signs of infection: ? More redness, swelling, or pain around your incision. ? More fluid or blood coming from your incision. ? Warmth coming from your incision. ? Pus or a bad smell coming from your incision. ? A fever.  You are nauseous or you vomit.  You are dizzy.  Your sutures, staples, skin glue, or adhesive strips come undone. Get help right away if:  You have a red streak on the skin near your incision.  Your incision bleeds through the dressing and the bleeding does not stop with gentle pressure.  The edges of your incision open up and separate.  You have signs of a serious bodily reaction to an infection. These signs may include: ? Fever, shaking chills, or feeling very cold. ? Confusion or anxiety. ? Severe pain. ? Trouble breathing. ? Fast heartbeat. ? Clammy or sweaty skin. ? A rash. These symptoms may represent a serious problem that is an emergency. Do not wait to see if the symptoms will go away. Get medical help right away. Call your local emergency services (911 in the [place] Do not drive yourself to the hospital. Summary  Follow instructions from your health care provider about how to care for your incision.  Wash your hands with soap and water for at least 20 seconds before and after you change the dressing. If soap and water are not available, use hand sanitizer.  Check your incision area every day for signs of infection.  Keep all follow-up visits as told by your health care provider. This is important. This information is not intended to replace advice given to you by your health care provider. Make sure you discuss any questions you have with your health care provider. Document Revised: 06/04/2019 Document Reviewed: 06/04/2019 Elsevier Patient Education  2021 Elsevier Inc.   

## 2020-11-26 NOTE — Progress Notes (Signed)
Physical Therapy Treatment Patient Details Name: Mercedes [name] MRN: [id number] DOB: 05-13-1956 Today's Date: 11/26/2020    History of Present Illness pt is a 65 y/o female admitted 3/24 with a non-slvageable chronically ischemic R LE, s/p R AKA 3/29.  PMHx, L AKA 2021, takotsubo CM, SLE, RA, PAD, MI, HTN    PT Comments    Pt received in bed, eager and receptive to participation in therapy. She performed BLE exercises. Min assist for posterior transfer bed to recliner. Pt remained in recliner at end of session. Current POC remains appropriate.    Follow Up Recommendations  SNF;Supervision/Assistance - 24 hour     Equipment Recommendations  Wheelchair (measurements PT);Wheelchair cushion (measurements PT) (upgrade current w/c)    Recommendations for Other Services       Precautions / Restrictions Precautions Precautions: Fall Precaution Comments: bil AKA Required Braces or Orthoses: Other Brace Other Brace: Has Lt prosthesis at home Restrictions RLE Weight Bearing: Non weight bearing    Mobility  Bed Mobility Overal bed mobility: Needs Assistance Bed Mobility: Supine to Sit;Sit to Supine     Supine to sit: Supervision;HOB elevated Sit to supine: Supervision;HOB elevated   General bed mobility comments: +rail    Transfers Overall transfer level: Needs assistance Equipment used: None Transfers: Licensed conveyancer transfers: Min assist   General transfer comment: min assist using bed pad to scoot posteriorly bed to recliner. Pt with good control using BUE. Increased time due to pain.  Ambulation/Gait             General Gait Details: nonambulatory   Stairs             Wheelchair Mobility    Modified Rankin (Stroke Patients Only)       Balance Overall balance assessment: Needs assistance Sitting-balance support: Single extremity supported Sitting balance-Leahy Scale: Fair                                       Cognition Arousal/Alertness: Awake/alert Behavior During Therapy: WFL for tasks assessed/performed Overall Cognitive Status: Within Functional Limits for tasks assessed                                        Exercises Amputee Exercises Gluteal Sets: AROM;Both;5 reps Hip Extension: AROM;Right;Left;5 reps Hip ABduction/ADduction: AROM;Both;10 reps    General Comments        Pertinent Vitals/Pain Pain Assessment: Faces Faces Pain Scale: Hurts little more Pain Location: RLE Pain Descriptors / Indicators: Grimacing;Discomfort;Guarding Pain Intervention(s): Monitored during session;Repositioned    Home Living                      Prior Function            PT Goals (current goals can now be found in the care plan section) Acute Rehab PT Goals Patient Stated Goal: home soon Progress towards PT goals: Progressing toward goals    Frequency    Min 3X/week      PT Plan Current plan remains appropriate    Co-evaluation              AM-PAC PT "6 Clicks" Mobility   Outcome Measure  Help needed turning from your back to your side while in a flat bed without  using bedrails?: A Little Help needed moving from lying on your back to sitting on the side of a flat bed without using bedrails?: A Little Help needed moving to and from a bed to a chair (including a wheelchair)?: A Little Help needed standing up from a chair using your arms (e.g., wheelchair or bedside chair)?: A Lot Help needed to walk in hospital room?: Total Help needed climbing 3-5 steps with a railing? : Total 6 Click Score: 13    End of Session   Activity Tolerance: Patient tolerated treatment well Patient left: in chair;with call bell/phone within reach Nurse Communication: Mobility status PT Visit Diagnosis: Other abnormalities of gait and mobility (R26.89);Pain Pain - Right/Left: Right Pain - part of body: Leg     Time: 2725-3664 PT Time  Calculation (min) (ACUTE ONLY): 19 min  Charges:  $Therapeutic Activity: 8-22 mins                     Aida Raider, PT  Office # [phone] Pager [phone]    Ilda Foil 11/26/2020, 9:28 AM

## 2020-11-26 NOTE — Progress Notes (Addendum)
  Progress Note    11/26/2020 10:21 AM 3 Days Post-Op  Subjective:  No complaints   Vitals:   11/26/20 0435 11/26/20 0825  BP: 132/72 119/66  Pulse:    Resp: 14 12  Temp: 98.1 F (36.7 C) 99 F (37.2 C)  SpO2: 99% 98%    Physical Exam: Incisions:  R AKA incision healing well   CBC    Component Value Date/Time   WBC 5.8 11/26/2020 0218   RBC 3.19 (L) 11/26/2020 0218   HGB 7.7 (L) 11/26/2020 0218   HGB 14.2 10/09/2018 1125   HCT 25.8 (L) 11/26/2020 0218   HCT 44.6 10/09/2018 1125   PLT 394 11/26/2020 0218   PLT 289 10/09/2018 1125   MCV 80.9 11/26/2020 0218   MCV 79 10/09/2018 1125   MCH 24.1 (L) 11/26/2020 0218   MCHC 29.8 (L) 11/26/2020 0218   RDW 18.6 (H) 11/26/2020 0218   RDW 14.8 10/09/2018 1125   LYMPHSABS 1.7 08/27/2020 0056   MONOABS 0.4 08/27/2020 0056   EOSABS 0.4 08/27/2020 0056   BASOSABS 0.0 08/27/2020 0056    BMET    Component Value Date/Time   NA 134 (L) 11/25/2020 0143   NA 135 10/09/2018 1125   K 4.5 11/25/2020 0143   CL 103 11/25/2020 0143   CO2 25 11/25/2020 0143   GLUCOSE 108 (H) 11/25/2020 0143   BUN 10 11/25/2020 0143   BUN 10 10/09/2018 1125   CREATININE 0.69 11/25/2020 0143   CREATININE 1.13 (H) 08/03/2016 1440   CALCIUM 8.8 (L) 11/25/2020 0143   GFRNONAA >60 11/25/2020 0143   GFRAA >60 11/24/2019 0620    INR    Component Value Date/Time   INR 1.0 11/18/2019 1844     Intake/Output Summary (Last 24 hours) at 11/26/2020 1021 Last data filed at 11/26/2020 0830 Gross per 24 hour  Intake 840 ml  Output 1000 ml  Net -160 ml     Assessment/Plan:  65 y.o. female is s/p right above knee amputation  3 Days Post-Op  -R AKA incision healing well -SNF when insurance approved   Emilie Rutter, [place] [facility] [phone] 11/26/2020 10:21 AM  AKA healing Apparently to [facility] today  Fabienne Bruns, MD [facility] [place] Office: [phone]

## 2020-11-26 NOTE — [place] FL2 (Signed)
[place] MEDICAID FL2 LEVEL OF CARE SCREENING TOOL     IDENTIFICATION  Patient Name: Mercedes [name] Birthdate: 06-Jan-1956 Sex: female Admission Date (Current Location): 11/18/2020  [facility] and [place] Number:  Producer, television/film/video and Address:  [facility], [address] [place], [place] [address]      Provider Number: [id number]  Attending Physician Name and Address:  Sherren Kerns, MD  Relative Name and Phone Number:       Current Level of Care: Hospital Recommended Level of Care: Skilled Nursing Facility Prior Approval Number:    Date Approved/Denied:   PASRR Number: [id number]  Discharge Plan: SNF    Current Diagnoses: Patient Active Problem List   Diagnosis Date Noted  . Protein-calorie malnutrition, severe 11/21/2020  . Ischemic rest pain of lower extremity 11/18/2020  . Malnutrition of moderate degree 11/04/2020  . Ischemia of extremity 10/29/2020  . Depression 12/15/2019  . S/P AKA (above knee amputation), left (HCC), 10/01/19 10/20/2019  . Femoral-popliteal bypass graft occlusion, left (HCC) 09/25/2019  . Peripheral arterial disease (HCC) 09/25/2019  . Critical lower limb ischemia (HCC) 05/09/2019  . 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 Diabetes (HCC) 04/01/2017  . Hyperthyroidism 02/21/2017  . Coronary artery disease involving native heart without angina pectoris   . Hx of CABG 07/17/2016  . Paroxysmal atrial fibrillation (HCC) 07/12/2016  . NSTEMI (non-ST elevated myocardial infarction) (HCC) 07/11/2016  . History of arterial bypass of lower extremity 12/30/2015  . Phantom limb pain-Left great toe 04/29/2015  . Seizure (HCC)   . Atrial flutter with rapid ventricular response vs. SVT 10/27/2014  . Atherosclerosis of native artery of left lower extremity with rest pain (HCC) 12/18/2013  . PAD (peripheral artery disease) (HCC) 12/02/2013  . SLE (systemic lupus erythematosus) (HCC)   . CAD - S/P Takotsubo MI in 2000, CFX DES  Sept 2014 05/25/2013  . Acute MI, inferior wall (HCC) 05/19/2013  . 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 Tachycardia-bradycardia (HCC) 03/09/2010  . PVD- s/p Lt [facility] April 2015 10/29/2009  . Dyslipidemia 08/26/2009  . Essential hypertension 08/26/2009  . CORONARY ATHEROSCLEROSIS NATIVE CORONARY ARTERY 08/26/2009  . 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 Rheumatoid arthritis (HCC) 08/26/2009    Orientation RESPIRATION BLADDER Height & Weight     Self,Time,Situation,Place  Normal External catheter,Continent Weight: 103 lb 2.8 oz (46.8 kg) Height:  5\' 5"  (165.1 cm)  BEHAVIORAL SYMPTOMS/MOOD NEUROLOGICAL BOWEL NUTRITION STATUS      Continent Diet (please see discharge summary)  AMBULATORY STATUS COMMUNICATION OF NEEDS Skin     Verbally Surgical wounds (closed incision RT Groin, closed incision RT thigh,)                       Personal Care Assistance Level of Assistance  Bathing,Feeding,Dressing Bathing Assistance: Limited assistance Feeding assistance: Independent Dressing Assistance: Limited assistance     Functional Limitations Info  Sight,Hearing,Speech Sight Info: Adequate Hearing Info: Adequate Speech Info: Adequate    SPECIAL CARE FACTORS FREQUENCY  PT (By licensed PT),OT (By licensed OT)     PT Frequency: 5x per week OT Frequency: 5x per week            Contractures Contractures Info: Not present    Additional Factors Info  Code Status,Allergies Code Status Info: FULL Allergies Info: Brilinta ,Latex,Tape           Current Medications (11/26/2020):  This is the current hospital active medication list Current Facility-Administered Medications  Medication  Dose Route Frequency Provider Last Rate Last Admin  . 0.9 %  sodium chloride infusion   Intravenous Continuous Dara Lords, PA-C   Stopped at 11/24/20 8182  . acetaminophen (TYLENOL) tablet 325-650 mg  325-650 mg Oral Q4H PRN Dara Lords, PA-C   650 mg at 11/18/20 1913   Or  .  acetaminophen (TYLENOL) suppository 325-650 mg  325-650 mg Rectal Q4H PRN Rhyne, Samantha J, PA-C      . alum & mag hydroxide-simeth (MAALOX/MYLANTA) 200-200-20 MG/5ML suspension 15-30 mL  15-30 mL Oral Q2H PRN Rhyne, Samantha J, PA-C      . apixaban (ELIQUIS) tablet 5 mg  5 mg Oral BID Sherren Kerns, MD   5 mg at 11/26/20 0848  . aspirin EC tablet 81 mg  81 mg Oral Daily Dara Lords, PA-C   81 mg at 11/26/20 0848  . ezetimibe (ZETIA) tablet 10 mg  10 mg Oral Daily Rhyne, Samantha J, PA-C   10 mg at 11/26/20 0847  . feeding supplement (ENSURE ENLIVE / ENSURE PLUS) liquid 237 mL  237 mL Oral TID BM Rhyne, Samantha J, PA-C   237 mL at 11/25/20 1307  . gabapentin (NEURONTIN) capsule 300 mg  300 mg Oral BID Dara Lords, PA-C   300 mg at 11/26/20 0848  . guaiFENesin-dextromethorphan (ROBITUSSIN DM) 100-10 MG/5ML syrup 15 mL  15 mL Oral Q4H PRN Rhyne, Samantha J, PA-C      . hydrALAZINE (APRESOLINE) injection 5 mg  5 mg Intravenous Q20 Min PRN Rhyne, Samantha J, PA-C   5 mg at 11/23/20 0743  . labetalol (NORMODYNE) injection 10 mg  10 mg Intravenous Q10 min PRN Rhyne, Samantha J, PA-C      . magnesium sulfate IVPB 2 g 50 mL  2 g Intravenous Daily PRN Rhyne, Samantha J, PA-C      . metoprolol tartrate (LOPRESSOR) injection 2-5 mg  2-5 mg Intravenous Q2H PRN Rhyne, Samantha J, PA-C      . metoprolol tartrate (LOPRESSOR) tablet 12.5 mg  12.5 mg Oral BID Rhyne, Samantha J, PA-C   12.5 mg at 11/26/20 0848  . morphine 2 MG/ML injection 1 mg  1 mg Intravenous Q2H PRN Rhyne, Ames Coupe, PA-C   1 mg at 11/25/20 0427  . ondansetron (ZOFRAN) injection 4 mg  4 mg Intravenous Q6H PRN Rhyne, Samantha J, PA-C      . oxyCODONE-acetaminophen (PERCOCET/ROXICET) 5-325 MG per tablet 1-2 tablet  1-2 tablet Oral Q4H PRN Rhyne, Ames Coupe, PA-C   2 tablet at 11/26/20 0848  . pantoprazole (PROTONIX) EC tablet 40 mg  40 mg Oral Daily Rhyne, Samantha J, PA-C   40 mg at 11/26/20 0848  . phenol (CHLORASEPTIC) mouth  spray 1 spray  1 spray Mouth/Throat PRN Rhyne, Samantha J, PA-C      . potassium chloride SA (KLOR-CON) CR tablet 20-40 mEq  20-40 mEq Oral Once Rhyne, Samantha J, PA-C      . potassium chloride SA (KLOR-CON) CR tablet 20-40 mEq  20-40 mEq Oral Daily PRN Rhyne, Samantha J, PA-C      . rosuvastatin (CRESTOR) tablet 40 mg  40 mg Oral QHS Rhyne, Samantha J, PA-C   40 mg at 11/25/20 2024     Discharge Medications: Please see discharge summary for a list of discharge medications.  Relevant Imaging Results:  Relevant Lab Results:   Additional Information SSN [id number]  Patient has NOT received covid vaccines  Eduard Roux, LCSW

## 2020-11-26 NOTE — Progress Notes (Signed)
Attempted to call report to [facility] facility and was told they would call me back. Left my number  -Estella Husk, RN

## 2020-11-26 NOTE — TOC Transition Note (Signed)
Transition of Care [facility]) - CM/SW Discharge Note   Patient Details  Name: Mercedes [name] MRN: [id number] Date of Birth: 1956/02/14  Transition of [facility]) CM/SW Contact:  Eduard Roux, LCSW Phone Number: 11/26/2020, 2:23 PM   Clinical Narrative:     Patient will Discharge to: [facility]  Discharge Date: 11/26/2020 Family Notified: [name], sister  Transport By: Sharin Mons  Per MD patient is ready for discharge. RN, patient, and facility notified of discharge. Discharge Summary sent to facility. RN given number for [phone], Room 3213. Ambulance transport requested for patient.   Clinical Social Worker signing off.  Antony Blackbird, MSW, LCSW Clinical Social Worker    Final next level of care: Skilled Nursing Facility Barriers to Discharge: Barriers Resolved   Patient Goals and CMS Choice        Discharge Placement              Patient [facility] bed at: [facility] Patient to be transferred to facility by: [facility] Name of family member notified: Beverly,sister Patient and family notified of of transfer: 11/26/20  Discharge Plan and Services In-house Referral: Clinical Social Work                                   Social Determinants of Health (SDOH) Interventions     Readmission Risk Interventions Readmission Risk Prevention Plan 09/08/2020 10/06/2019 05/16/2019  Transportation Screening Complete Complete Complete  PCP or Specialist Appt within 5-7 Days Complete Complete Complete  Home Care Screening Complete Complete Complete  Medication Review (RN CM) Complete Complete Complete  Some recent data might be hidden

## 2020-11-26 NOTE — Care Management Important Message (Signed)
Important Message  Patient Details  Name: Mercedes [name] MRN: [id number] Date of Birth: 1956-01-11   Medicare Important Message Given:  Yes     [name] 11/26/2020, 10:31 AM

## 2020-11-26 NOTE — Progress Notes (Signed)
Mobility Specialist: Progress Note   11/26/20 1225  Mobility  Activity Refused mobility   Pt refused mobility stating she just transferred from the chair to the bed with RN. Pt is expecting to discharge to SNF today.   [facility] Dennis Hegeman Mobility Specialist Mobility Specialist Phone: [phone]

## 2020-11-26 NOTE — Progress Notes (Signed)
Pt for SNF placement, CSW assisting with transition needs for SNF. CM follow up regarding PCS services as per pt request. Call made to [facility]. [place] with Tammy- per conversation Tammy confirmed that they had received paperwork regarding request for increase in PCS aide hours from last hospital discharge and that paperwork had been processed. Per Tammy they have been trying to reach patient regarding scheduling assessment for increased hours- it is documented that 2 calls have been attempted. Explained to Tammy that pt was re-admitted to [facility] on 3/24 and had surgery again on this admit. Plan for d/c today to SNF rehab. Tammy has documented this update into their system and placed hold for now. Tammy states pt will need to call them when she is released from rehab to schedule the needed assessment.  Patient has been informed of the above and understands she will need to call [facility] [employer] upon return home from rehab so that they can schedule the assessment that needs to be done for increased PCS service hours.

## 2020-11-26 NOTE — Consult Note (Signed)
   [facility] CM Inpatient Consult   11/26/2020  KHRISTA BRAUN Nov 10, 1955 [id number]   [place] [facility] [ACO] Patient: [employer]   Patient is currently active with [facility] for chronic disease management services.  Patient has been engaged by a [facility] program noted.  Our community based plan of care has focused on disease management and community resource support.  Patient is transitioning to a skilled nursing level of care.  Chart reviewed for ongoing care needs when returning home from SNF rehab.  Met with the patient, awaken from nap, when entered the room.  "You woke me up from my nap."  Apologized and explained my role in follow up with community needs.  She verbalized understanding and her follow up needs are clear for when returning from rehab.  She states work is to be started on her home with [facility] on Monday and feels everyone involved knows what's going on. Denies any additional concerns.  Plan: Will alert Estill Bamberg with [facility] of transition to SNF. Of note, [facility] Care Management services does not replace or interfere with any services that are needed or arranged by inpatient [facility] care management team.  For additional questions or referrals please contact:  Natividad Brood, RN BSN [facility] Liaison Toll free office [phone]  Fax number: [phone] [email or web address]

## 2020-11-26 NOTE — Discharge Summary (Signed)
Discharge Summary  Patient ID: Mercedes [name] [id number] 64 y.o. Mercedes [name], Mercedes [name]  Admit date: 11/18/2020  Discharge date and time: 11/26/20   Admitting Physician: Sherren Kerns, MD   Discharge Physician: same  Admission Diagnoses: Ischemic rest pain of lower extremity [M79.606, I99.8]  Discharge Diagnoses: s/p R AKA  Admission Condition: poor  Discharged Condition: fair  Indication for Admission: Ischemic rest pain of right lower extremity  Hospital Course: Mercedes [name] is a 65 year old female who underwent extensive revascularization of right lower extremity for limb salvage during prior hospital admission.  She presented to the office with return of rest pain of right lower extremity and was directly admitted to the hospital for pain control.  She was found to have an occluded right lower extremity bypass.  At this point she had no further options for revascularization.  She eventually consented to right above-the-knee amputation which was performed by Dr. Darrick Penna on 11/23/2020.  Postoperative course consisted of pain control and working with therapy on transferring and upper body exercise.  The transitions of care team was consulted for skilled nursing facility placement.  She was accepted to [facility] nursing facility and insurance approved on 11/26/2020.  She will be prescribed 2 to 3 days of narcotic pain medication for continued postoperative pain control.  She will follow-up in 4 to 6 weeks in office for staple removal.  She was discharged to skilled nursing facility in stable condition.  Consults: None  Treatments: surgery: Right above-the-knee amputation on 11/23/2020 by Dr. Darrick Penna  Discharge Exam: See progress note 11/26/20 Vitals:   11/26/20 0825 11/26/20 1230  BP: 119/66 134/71  Pulse:    Resp: 12 (!) 9  Temp: 99 F (37.2 C) 97.8 F (36.6 C)  SpO2: 98% 99%     Disposition: Discharge disposition: 03-Skilled Nursing Facility       Patient Instructions:   Allergies as of 11/26/2020      Reactions   Brilinta [ticagrelor] Anaphylaxis, Shortness Of Breath, Other (See Comments)   Also, chest tightness   Latex Rash   Tape Rash      Medication List    TAKE these medications   acetaminophen 500 MG tablet Commonly known as: TYLENOL Take 500-1,000 mg by mouth every 8 (eight) hours as needed for mild pain or headache.   aspirin 81 MG EC tablet Take 1 tablet (81 mg total) by mouth daily at 6 (six) AM. Swallow whole.   Eliquis 5 MG Tabs tablet Generic drug: apixaban TAKE 1 TABLET BY MOUTH TWICE DAILY What changed: how much to take   Ensure Max Protein Liqd Take 8 oz by mouth daily.   ezetimibe 10 MG tablet Commonly known as: Zetia Take 1 tablet (10 mg total) by mouth daily.   ferrous sulfate 325 (65 FE) MG tablet Take 1 tablet (325 mg total) by mouth 3 (three) times daily with meals.   gabapentin 300 MG capsule Commonly known as: NEURONTIN Take 1 capsule (300 mg total) by mouth 2 (two) times daily.   metoprolol tartrate 25 MG tablet Commonly known as: LOPRESSOR Take 0.5 tablets (12.5 mg total) by mouth 2 (two) times daily.   multivitamin with minerals Tabs tablet Take 1 tablet by mouth daily.   nitroGLYCERIN 0.4 MG SL tablet Commonly known as: NITROSTAT Place 0.4 mg under the tongue every 4 (four) hours as needed for chest pain.   oxyCODONE-acetaminophen 5-325 MG tablet Commonly known as: PERCOCET/ROXICET Take 1 tablet by mouth every 6 (six) hours as needed for moderate  pain.   rosuvastatin 40 MG tablet Commonly known as: CRESTOR Take 40 mg by mouth at bedtime.      Activity: activity as tolerated Diet: regular diet Wound Care: keep wound clean and dry  Follow-up with [facility] in 5 weeks.  Signed: Emilie Rutter, PA-C 11/26/2020 2:07 PM [facility] Office: [phone]

## 2020-11-30 DIAGNOSIS — R252 Cramp and spasm: Secondary | ICD-10-CM | POA: Diagnosis not present

## 2020-11-30 DIAGNOSIS — I10 Essential (primary) hypertension: Secondary | ICD-10-CM | POA: Diagnosis not present

## 2020-11-30 DIAGNOSIS — Z89611 Acquired absence of right leg above knee: Secondary | ICD-10-CM | POA: Diagnosis not present

## 2020-11-30 DIAGNOSIS — I739 Peripheral vascular disease, unspecified: Secondary | ICD-10-CM | POA: Diagnosis not present

## 2020-11-30 DIAGNOSIS — I4891 Unspecified atrial fibrillation: Secondary | ICD-10-CM | POA: Diagnosis not present

## 2020-11-30 DIAGNOSIS — D649 Anemia, unspecified: Secondary | ICD-10-CM | POA: Diagnosis not present

## 2020-11-30 DIAGNOSIS — K219 Gastro-esophageal reflux disease without esophagitis: Secondary | ICD-10-CM | POA: Diagnosis not present

## 2020-11-30 DIAGNOSIS — Z89612 Acquired absence of left leg above knee: Secondary | ICD-10-CM | POA: Diagnosis not present

## 2020-11-30 DIAGNOSIS — E785 Hyperlipidemia, unspecified: Secondary | ICD-10-CM | POA: Diagnosis not present

## 2020-11-30 DIAGNOSIS — I495 Sick sinus syndrome: Secondary | ICD-10-CM | POA: Diagnosis not present

## 2020-11-30 DIAGNOSIS — I2510 Atherosclerotic heart disease of native coronary artery without angina pectoris: Secondary | ICD-10-CM | POA: Diagnosis not present

## 2020-12-01 DIAGNOSIS — I4891 Unspecified atrial fibrillation: Secondary | ICD-10-CM | POA: Diagnosis not present

## 2020-12-01 DIAGNOSIS — I2510 Atherosclerotic heart disease of native coronary artery without angina pectoris: Secondary | ICD-10-CM | POA: Diagnosis not present

## 2020-12-01 DIAGNOSIS — Z89611 Acquired absence of right leg above knee: Secondary | ICD-10-CM | POA: Diagnosis not present

## 2020-12-01 DIAGNOSIS — D638 Anemia in other chronic diseases classified elsewhere: Secondary | ICD-10-CM | POA: Diagnosis not present

## 2020-12-01 DIAGNOSIS — G629 Polyneuropathy, unspecified: Secondary | ICD-10-CM | POA: Diagnosis not present

## 2020-12-01 DIAGNOSIS — I480 Paroxysmal atrial fibrillation: Secondary | ICD-10-CM | POA: Diagnosis not present

## 2020-12-01 DIAGNOSIS — R252 Cramp and spasm: Secondary | ICD-10-CM | POA: Diagnosis not present

## 2020-12-01 DIAGNOSIS — I739 Peripheral vascular disease, unspecified: Secondary | ICD-10-CM | POA: Diagnosis not present

## 2020-12-01 DIAGNOSIS — I10 Essential (primary) hypertension: Secondary | ICD-10-CM | POA: Diagnosis not present

## 2020-12-07 DIAGNOSIS — Z89612 Acquired absence of left leg above knee: Secondary | ICD-10-CM | POA: Diagnosis not present

## 2020-12-07 DIAGNOSIS — I495 Sick sinus syndrome: Secondary | ICD-10-CM | POA: Diagnosis not present

## 2020-12-07 DIAGNOSIS — I2510 Atherosclerotic heart disease of native coronary artery without angina pectoris: Secondary | ICD-10-CM | POA: Diagnosis not present

## 2020-12-07 DIAGNOSIS — I4891 Unspecified atrial fibrillation: Secondary | ICD-10-CM | POA: Diagnosis not present

## 2020-12-07 DIAGNOSIS — Z89611 Acquired absence of right leg above knee: Secondary | ICD-10-CM | POA: Diagnosis not present

## 2020-12-07 DIAGNOSIS — I10 Essential (primary) hypertension: Secondary | ICD-10-CM | POA: Diagnosis not present

## 2020-12-07 DIAGNOSIS — R252 Cramp and spasm: Secondary | ICD-10-CM | POA: Diagnosis not present

## 2020-12-14 DIAGNOSIS — Z89611 Acquired absence of right leg above knee: Secondary | ICD-10-CM | POA: Insufficient documentation

## 2020-12-14 NOTE — Progress Notes (Addendum)
Subjective:    Patient ID: Mercedes [name], female    DOB: 1956-06-21, 65 y.o.   MRN: [id number]  HPI The patient is here for follow up from the hospital and rehab.  Admitted 11/18/20 - 11/26/20  For ischemic rest pain of RLE s/p R AKA  She was admitted after seeing Dr Darrick Penna in the office for ischemic rest pain of RLE.  She had had revascularization of RLE during prior admission, but was having pain from ischemia.  She had an occluded RLE bypass and the only option was amputation, done on 3/29.  Post op she has pain control, therapy and discharge to SNF.  No information has been received from [facility].    She is doing some exercising since being home.  She has a ramp into her house.  She has been doing some transferring,but feels she could use more instruction.    She feels she is doing ok with depression/anxiety - she is fighting it.    Appetite varies - gets full easily.  Sleep is better.       Mobility Evaluation :  A power wheel chair will assist the patient in the home with ADLs; including cooking, cleaning and toileting. A cane, walker or manual wheel chair will not assist patient efficiently due to upper extremity weakness - inability to use manual wheelchair long distances.  Patient has the mental and physical capacity to follow and operate the electric devices safety instructions. Patient is willing to use in the home as well as outside of the home.       Medications and allergies reviewed with patient and updated if appropriate.  Patient Active Problem List   Diagnosis Date Noted  . S/P AKA (above knee amputation), right (HCC), 11/23/20 12/14/2020  . Protein-calorie malnutrition, severe 11/21/2020  . Malnutrition of moderate degree 11/04/2020  . Ischemia of extremity 10/29/2020  . Depression 12/15/2019  . S/P AKA (above knee amputation), left (HCC), 10/01/19 10/20/2019  . Femoral-popliteal bypass graft occlusion, left (HCC) 09/25/2019  . Peripheral  arterial disease (HCC) 09/25/2019  . Critical lower limb ischemia (HCC) 05/09/2019  . 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 Diabetes (HCC) 04/01/2017  . Hyperthyroidism 02/21/2017  . Coronary artery disease involving native heart without angina pectoris   . Hx of CABG 07/17/2016  . Paroxysmal atrial fibrillation (HCC) 07/12/2016  . NSTEMI (non-ST elevated myocardial infarction) (HCC) 07/11/2016  . Phantom limb pain-Left great toe 04/29/2015  . Seizure (HCC)   . Atrial flutter with rapid ventricular response vs. SVT 10/27/2014  . Atherosclerosis of native artery of left lower extremity with rest pain (HCC) 12/18/2013  . PAD (peripheral artery disease) (HCC) 12/02/2013  . SLE (systemic lupus erythematosus) (HCC)   . CAD - S/P Takotsubo MI in 2000, CFX DES Sept 2014 05/25/2013  . Acute MI, inferior wall (HCC) 05/19/2013  . 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 Tachycardia-bradycardia (HCC) 03/09/2010  . PVD- s/p Lt [facility] April 2015 10/29/2009  . Dyslipidemia 08/26/2009  . Essential hypertension 08/26/2009  . CORONARY ATHEROSCLEROSIS NATIVE CORONARY ARTERY 08/26/2009  . 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 Rheumatoid arthritis (HCC) 08/26/2009    Current Outpatient Medications on File Prior to Visit  Medication Sig Dispense Refill  . acetaminophen (TYLENOL) 500 MG tablet Take 500-1,000 mg by mouth every 8 (eight) hours as needed for mild pain or headache.    [name] aspirin EC 81 MG EC tablet Take 1 tablet (81 mg total) by mouth daily at 6 (six)  AM. Swallow whole.    [name] ELIQUIS 5 MG TABS tablet TAKE 1 TABLET BY MOUTH TWICE DAILY (Patient taking differently: Take 5 mg by mouth 2 (two) times daily.) 180 tablet 1  . Ensure Max Protein (ENSURE MAX PROTEIN) LIQD Take 8 oz by mouth daily.    [name] ezetimibe (ZETIA) 10 MG tablet TAKE 1 TABLET (10 MG TOTAL) BY MOUTH DAILY. 90 tablet 3  . ferrous sulfate 325 (65 FE) MG tablet Take 1 tablet (325 mg total) by mouth 3 (three) times daily with meals. 90  tablet 1  . gabapentin (NEURONTIN) 300 MG capsule Take 1 capsule (300 mg total) by mouth 2 (two) times daily. 60 capsule 3  . metoprolol tartrate (LOPRESSOR) 25 MG tablet Take 0.5 tablets (12.5 mg total) by mouth 2 (two) times daily. 180 tablet 3  . Multiple Vitamin (MULTIVITAMIN WITH MINERALS) TABS tablet Take 1 tablet by mouth daily.    . nitroGLYCERIN (NITROSTAT) 0.4 MG SL tablet Place 0.4 mg under the tongue every 4 (four) hours as needed for chest pain.    [name] oxyCODONE-acetaminophen (PERCOCET/ROXICET) 5-325 MG tablet Take 1 tablet by mouth every 6 (six) hours as needed for moderate pain. 30 tablet 0  . rosuvastatin (CRESTOR) 40 MG tablet Take 40 mg by mouth at bedtime.     No current facility-administered medications on file prior to visit.    Past Medical History:  Diagnosis Date  . Abnormal LFTs    a. in the past when on Lipitor  . Anemia    as a young woman  . Anxiety   . Arnold-Chiari malformation, type I (HCC)    MRI brain 02/2010  . CAD (coronary artery disease)    a. NSTEMI 07/2009: LHC - D2 40%, LAD irreg., EF 50% with apical AK (tako-tsubo CM);  b. Inf STEMI (04/2013): Tx Promus DES to CFX;  c. 08/2013 Lexi CL: No ischemia, dist ant/ap/inf-ap infarct, EF  d. 06/2016 NSTEMI with LM disease: s/p CABG on 07/17/2016 w/ LIMA-LAD, SVG-OM, and SVG-dRCA.  [name] DEPRESSION   . Dizziness    ? CVA 01/2010 - carotid dopplers with no ICA stenosis  . GERD (gastroesophageal reflux disease)   . Headache   . 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 HYPERLIPIDEMIA   . HYPERTENSION   . MI (myocardial infarction) (HCC)   . PAD (peripheral artery disease) (HCC)    s/p L fem pop 11/2013 in setting of 1st toe osteo/gangrene  . Paroxysmal atrial fibrillation (HCC)    a. anticoagulated with Eliquis 10/2014  . 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 RA (rheumatoid arthritis) (HCC)    prior tx by Dr. Dareen Piano  . Sjogren's syndrome  (HCC) 06/02/13   pt denies this (12/01/13)  . SLE (systemic lupus erythematosus) (HCC) dx 05/2013   follows with rheum Dareen Piano)  . Tachy-brady syndrome (HCC)    a. s/p STJ Leadless pacemaker 11-04-2014 Dr Johney Frame  . 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 Wears dentures   . Wears glasses     Past Surgical History:  Procedure Laterality Date  . ABDOMINAL AORTAGRAM N/A 11/24/2013   Procedure: ABDOMINAL AORTAGRAM WITH BILATERAL RUNOFF WITH POSSIBLE INTERVENTION;  Surgeon: Chuck Hint, MD;  Location: [facility] CATH LAB;  Service: Cardiovascular;  Laterality: N/A;  . ABDOMINAL AORTOGRAM W/LOWER EXTREMITY N/A 04/25/2019   Procedure: ABDOMINAL AORTOGRAM W/LOWER EXTREMITY;  Surgeon: Sherren Kerns, MD;  Location: [facility] INVASIVE CV LAB;  Service: Cardiovascular;  Laterality: N/A;  . ABDOMINAL AORTOGRAM W/LOWER EXTREMITY Bilateral 05/15/2019   Procedure: ABDOMINAL AORTOGRAM W/LOWER EXTREMITY;  Surgeon: Nada Libman, MD;  Location: [facility] INVASIVE CV LAB;  Service: Cardiovascular;  Laterality: Bilateral;  . ABDOMINAL AORTOGRAM W/LOWER EXTREMITY N/A 08/23/2020   Procedure: ABDOMINAL AORTOGRAM W/LOWER EXTREMITY;  Surgeon: Maeola Harman, MD;  Location: [facility] INVASIVE CV LAB;  Service: Cardiovascular;  Laterality: N/A;  . ABDOMINAL AORTOGRAM W/LOWER EXTREMITY N/A 10/29/2020   Procedure: ABDOMINAL AORTOGRAM W/LOWER EXTREMITY;  Surgeon: Sherren Kerns, MD;  Location: [facility] INVASIVE CV LAB;  Service: Cardiovascular;  Laterality: N/A;  . AMPUTATION Left 12/02/2013   Procedure: LEFT 1ST TOE AMPUTATION;  Surgeon: Sherren Kerns, MD;  Location: [facility] OR;  Service: Vascular;  Laterality: Left;  . AMPUTATION Left 07/16/2019   Procedure: LEFT TRANSMETATARSAL AMPUTATION;  Surgeon: Nadara Mustard, MD;  Location: [facility] OR;  Service: Orthopedics;  Laterality: Left;  . AMPUTATION Left 10/01/2019   Procedure: LEFT AMPUTATION ABOVE KNEE;  Surgeon: Sherren Kerns, MD;  Location: [facility] OR;  Service: Vascular;  Laterality: Left;  . AMPUTATION Right 08/31/2020   Procedure: Right first toe amputation;  Surgeon: Sherren Kerns, MD;  Location: [facility] OR;  Service: Vascular;  Laterality: Right;  . AMPUTATION Right 11/23/2020   Procedure: RIGHT AMPUTATION ABOVE KNEE;  Surgeon: Sherren Kerns, MD;  Location: [facility] OR;  Service: Vascular;  Laterality: Right;  . APPLICATION OF WOUND VAC Left 11/03/2019   Procedure: Application Of Wound Vac, left lower extremity;  Surgeon: Sherren Kerns, MD;  Location: [facility] OR;  Service: Vascular;  Laterality: Left;  . CARDIAC CATHETERIZATION N/A 07/12/2016   Procedure: Left Heart Cath and Coronary Angiography;  Surgeon: Lennette Bihari, MD;  Location: [facility] INVASIVE CV LAB;  Service: Cardiovascular;  Laterality: N/A;  . CARDIAC CATHETERIZATION N/A 07/14/2016   Procedure: Intravascular Ultrasound/IVUS;  Surgeon: Yvonne Kendall, MD;  Location: [facility] INVASIVE CV LAB;  Service: Cardiovascular;  Laterality: N/A;  . COLONOSCOPY    . CORONARY ANGIOPLASTY  04/2013  . CORONARY ARTERY BYPASS GRAFT N/A 07/17/2016   Procedure: CORONARY ARTERY BYPASS GRAFTING (CABG)x3 using right greater saphenous vein harvested endoscopiclly and left internal mammary artery;  Surgeon: Kerin Perna, MD;  Location: [facility] OR;  Service: Open Heart Surgery;  Laterality: N/A;  . ENDARTERECTOMY FEMORAL Left 05/12/2019   Procedure: LEFT Femoral Endarterectomy;  Surgeon: Sherren Kerns, MD;  Location: [facility] OR;  Service: Vascular;  Laterality: Left;  . FEMORAL-POPLITEAL BYPASS GRAFT Left 12/02/2013   Procedure:  LEFT FEMORAL-BELOW KNEE POPLITEAL ARTERY BYPASS GRAFT;  Surgeon: Sherren Kerns, MD;  Location: [facility] OR;  Service: Vascular;  Laterality: Left;  . FEMORAL-POPLITEAL BYPASS GRAFT Left 09/25/2019   Procedure: THROMBECTOMY and PROPATEN BYPASS  FEMORAL TO BELOW KNEE POPLITEAL ARTERY BYPASS GRAFT LEFT LEG;  Surgeon: Larina Earthly, MD;  Location: [facility] OR;  Service: Vascular;   Laterality: Left;  . FEMORAL-POPLITEAL BYPASS GRAFT Right 08/24/2020   Procedure: RIGHT FEMORAL-BELOW KNEE POPLITEAL ARTERY BYPASS USING GORE PROPATEN GRAFT;  Surgeon: Maeola Harman, MD;  Location: [facility] OR;  Service: Vascular;  Laterality: Right;  . FEMORAL-POPLITEAL BYPASS GRAFT Right 11/01/2020   Procedure: Thrombectomy and Revision of Right Femoral to below knee Bypass with Interpostional graft to Tibial / peroneal Trunk and Endarterectomy Tibial Weston Settle Trunk.;  Surgeon: Sherren Kerns, MD;  Location: [facility] OR;  Service: Vascular;  Laterality: Right;  . INTRAOPERATIVE  ARTERIOGRAM Right 11/01/2020   Procedure: ANTEGRADE RIGHT LOWER EXTREMITY INTRA OPERATIVE ARTERIOGRAM;  Surgeon: Sherren Kerns, MD;  Location: [facility] OR;  Service: Vascular;  Laterality: Right;  . LEFT HEART CATHETERIZATION WITH CORONARY ANGIOGRAM N/A 05/19/2013   Procedure: LEFT HEART CATHETERIZATION WITH CORONARY ANGIOGRAM;  Surgeon: Micheline Chapman, MD;  Location: [facility] CATH LAB;  Service: Cardiovascular;  Laterality: N/A;  . LEFT HEART CATHETERIZATION WITH CORONARY ANGIOGRAM N/A 10/28/2014   Procedure: LEFT HEART CATHETERIZATION WITH CORONARY ANGIOGRAM;  Surgeon: Iran Ouch, MD;  Location: [facility] CATH LAB;  Service: Cardiovascular;  Laterality: N/A;  . LOWER EXTREMITY ANGIOGRAM Left 05/12/2019   Procedure: Lower Extremity Angiogram;  Surgeon: Sherren Kerns, MD;  Location: [facility] OR;  Service: Vascular;  Laterality: Left;  . PATCH ANGIOPLASTY Left 05/12/2019   Procedure: Left Femoral Patch Angioplasty USING CADAVERIC SAPHENOUS VEIN;  Surgeon: Sherren Kerns, MD;  Location: [facility] OR;  Service: Vascular;  Laterality: Left;  . PERCUTANEOUS CORONARY STENT INTERVENTION (PCI-S)  05/19/2013   Procedure: PERCUTANEOUS CORONARY STENT INTERVENTION (PCI-S);  Surgeon: Micheline Chapman, MD;  Location: [facility] CATH LAB;  Service: Cardiovascular;;  . PERIPHERAL VASCULAR INTERVENTION Right 10/29/2020   Procedure: PERIPHERAL VASCULAR INTERVENTION;   Surgeon: Sherren Kerns, MD;  Location: [facility] INVASIVE CV LAB;  Service: Cardiovascular;  Laterality: Right;  common iliac  . PERMANENT PACEMAKER INSERTION N/A 11/04/2014   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  . TEE WITHOUT CARDIOVERSION N/A 07/17/2016   Procedure: TRANSESOPHAGEAL ECHOCARDIOGRAM (TEE);  Surgeon: Kerin Perna, MD;  Location: [facility] OR;  Service: Open Heart Surgery;  Laterality: N/A;  . THROMBECTOMY FEMORAL ARTERY Left 05/12/2019   Procedure: Left Ilio-Femoral Artery and Femoral-popliteal Graft Thrombectomy;  Surgeon: Sherren Kerns, MD;  Location: [facility] OR;  Service: Vascular;  Laterality: Left;  . THROMBECTOMY OF BYPASS GRAFT FEMORAL- POPLITEAL ARTERY Right 11/01/2020   Procedure: POSSIBLE THROMBOLYSIS VERSUS OPEN THROMBECTOMY AND REVSION OF RIGHT FEMORAL-POPLITEAL ARTERY BYPASS;  Surgeon: Sherren Kerns, MD;  Location: [facility] OR;  Service: Vascular;  Laterality: Right;  . TUBAL LIGATION    . VISCERAL ANGIOGRAPHY N/A 04/25/2019   Procedure: MESENTRIC  ANGIOGRAPHY;  Surgeon: Sherren Kerns, MD;  Location: [facility] INVASIVE CV LAB;  Service: Cardiovascular;  Laterality: N/A;  . WOUND DEBRIDEMENT Left 11/03/2019   Procedure: Debridement Wound of left above the knee amputation;  Surgeon: Sherren Kerns, MD;  Location: [facility] OR;  Service: Vascular;  Laterality: Left;    Social History   Socioeconomic History  . Marital status: Single    Spouse name: Not on file  . Number of children: 3  . Years of education: Not on file  . Highest education level: Not on file  Occupational History  . Occupation: retired  Tobacco Use  . Smoking status: Former Smoker    Types: Cigarettes    Quit date: 2019    Years since quitting: 3.3  . Smokeless tobacco: Never Used  . Tobacco comment:    Vaping Use  . Vaping Use: Never used  Substance and Sexual Activity  . Alcohol use: No    Alcohol/week: 0.0 standard drinks  . Drug use: Not Currently    Types: Marijuana, Other-see  comments    Comment:  and CBD oil  last use of both was 10/29/19  . Sexual activity: Not on file  Other Topics Concern  . Not on file  Social History Narrative   Lives alone.   Social Determinants of Health   Financial  Resource Strain: Low Risk   . Difficulty of Paying Living Expenses: Not hard at all  Food Insecurity: No Food Insecurity  . Worried About Programme researcher, broadcasting/film/video in the Last Year: Never true  . Ran Out of Food in the Last Year: Never true  Transportation Needs: No Transportation Needs  . Lack of Transportation (Medical): No  . Lack of Transportation (Non-Medical): No  Physical Activity: Inactive  . Days of Exercise per Week: 0 days  . Minutes of Exercise per Session: 0 min  Stress: No Stress Concern Present  . Feeling of Stress : Not at all  Social Connections: Unknown  . Frequency of Communication with Friends and Family: More than three times a week  . Frequency of Social Gatherings with Friends and Family: Once a week  . Attends Religious Services: Patient refused  . Active Member of Clubs or Organizations: Patient refused  . Attends Banker Meetings: Patient refused  . Marital Status: Never married    Family History  Problem Relation Age of Onset  . Arthritis Mother   . Emphysema Mother   . Arthritis Father   . COPD Father   . Heart disease Father   . Diabetes Paternal Aunt        paternal Aunts  . Heart disease Paternal Aunt   . Thyroid disease Neg Hx     Review of Systems  Constitutional: Negative for chills and fever.  Respiratory: Negative for cough, shortness of breath and wheezing.   Cardiovascular: Negative for chest pain and palpitations.  Gastrointestinal: Negative for abdominal pain and diarrhea.  Neurological: Negative for light-headedness and headaches.  Psychiatric/Behavioral: Negative for sleep disturbance.       Objective:   Vitals:   12/15/20 1003  BP: 140/84  Pulse: 94  Temp: 99 F (37.2 C)  SpO2: 97%   BP  Readings from Last 3 Encounters:  12/15/20 140/84  11/26/20 124/68  11/18/20 (!) 159/133   Wt Readings from Last 3 Encounters:  11/22/20 103 lb 2.8 oz (46.8 kg)  11/18/20 89 lb (40.4 kg)  10/29/20 89 lb 1.1 oz (40.4 kg)   There is no height or weight on file to calculate BMI.   Physical Exam    Constitutional: Appears well-developed and well-nourished. No distress.  HENT:  Head: Normocephalic and atraumatic.  Neck: Neck supple. No tracheal deviation present. No thyromegaly present.  No cervical lymphadenopathy Cardiovascular: Normal rate, regular rhythm and normal heart sounds.   No murmur heard. No carotid bruit .  No edema Pulmonary/Chest: Effort normal and breath sounds normal. No respiratory distress. No has no wheezes. No rales.  Msk: s/p AKA b/l Neuro:  UE strength 5/5 Skin: Skin is warm and dry. Not diaphoretic.  Psychiatric: Normal mood and affect. Behavior is normal.      Assessment & Plan:    See Problem List for Assessment and Plan of chronic medical problems.    This visit occurred during the SARS-CoV-2 public health emergency.  Safety protocols were in place, including screening questions prior to the visit, additional usage of staff PPE, and extensive cleaning of exam room while observing appropriate contact time as indicated for disinfecting solutions.

## 2020-12-15 ENCOUNTER — Other Ambulatory Visit: Payer: Self-pay

## 2020-12-15 ENCOUNTER — Encounter: Payer: Self-pay | Admitting: Internal Medicine

## 2020-12-15 ENCOUNTER — Ambulatory Visit (INDEPENDENT_AMBULATORY_CARE_PROVIDER_SITE_OTHER): Payer: Medicare Other | Admitting: Internal Medicine

## 2020-12-15 VITALS — BP 140/84 | HR 94 | Temp 99.0°F

## 2020-12-15 DIAGNOSIS — Z89612 Acquired absence of left leg above knee: Secondary | ICD-10-CM | POA: Diagnosis not present

## 2020-12-15 DIAGNOSIS — F3289 Other specified depressive episodes: Secondary | ICD-10-CM

## 2020-12-15 DIAGNOSIS — Z89611 Acquired absence of right leg above knee: Secondary | ICD-10-CM | POA: Diagnosis not present

## 2020-12-15 NOTE — Assessment & Plan Note (Signed)
New Would benefit from home PT/OT for strength exercise, instructions on transferring, see if she needs any additional equipment for home - ordered

## 2020-12-15 NOTE — Patient Instructions (Addendum)
   Medications changes include :   none    A referral was ordered for home PT and OT.     Someone from their office will call you to schedule an appointment.     Please followup in 1 year

## 2020-12-15 NOTE — Assessment & Plan Note (Addendum)
Currently denies depression - feels like she is doing well with adjusting to her recent limb loss No medication needed at this time Will monitor

## 2020-12-17 ENCOUNTER — Telehealth: Payer: Self-pay

## 2020-12-17 ENCOUNTER — Telehealth: Payer: Self-pay | Admitting: Internal Medicine

## 2020-12-17 DIAGNOSIS — Z95820 Peripheral vascular angioplasty status with implants and grafts: Secondary | ICD-10-CM | POA: Diagnosis not present

## 2020-12-17 DIAGNOSIS — R531 Weakness: Secondary | ICD-10-CM | POA: Diagnosis not present

## 2020-12-17 DIAGNOSIS — E785 Hyperlipidemia, unspecified: Secondary | ICD-10-CM | POA: Diagnosis not present

## 2020-12-17 DIAGNOSIS — R262 Difficulty in walking, not elsewhere classified: Secondary | ICD-10-CM | POA: Diagnosis not present

## 2020-12-17 DIAGNOSIS — Z89612 Acquired absence of left leg above knee: Secondary | ICD-10-CM | POA: Diagnosis not present

## 2020-12-17 DIAGNOSIS — T82392D Other mechanical complication of femoral arterial graft (bypass), subsequent encounter: Secondary | ICD-10-CM | POA: Diagnosis not present

## 2020-12-17 DIAGNOSIS — Z89411 Acquired absence of right great toe: Secondary | ICD-10-CM | POA: Diagnosis not present

## 2020-12-17 DIAGNOSIS — I10 Essential (primary) hypertension: Secondary | ICD-10-CM | POA: Diagnosis not present

## 2020-12-17 NOTE — Telephone Encounter (Signed)
Gave [facility] PT verbal orders for 2x a week for 2 weeks to continue to work on general strength and transfers and also to have an OT evaluation.

## 2020-12-17 NOTE — Telephone Encounter (Signed)
Mercedes [name] w/ [facility] wanted to inform Dr. Lawerance Bach that the patient is wanting a powered wheelchair. She said that [employer] will be faxing paperwork over.

## 2020-12-22 ENCOUNTER — Telehealth: Payer: Self-pay

## 2020-12-22 ENCOUNTER — Other Ambulatory Visit: Payer: Self-pay

## 2020-12-22 DIAGNOSIS — T82392D Other mechanical complication of femoral arterial graft (bypass), subsequent encounter: Secondary | ICD-10-CM | POA: Diagnosis not present

## 2020-12-22 DIAGNOSIS — R531 Weakness: Secondary | ICD-10-CM | POA: Diagnosis not present

## 2020-12-22 DIAGNOSIS — Z89411 Acquired absence of right great toe: Secondary | ICD-10-CM | POA: Diagnosis not present

## 2020-12-22 DIAGNOSIS — Z95820 Peripheral vascular angioplasty status with implants and grafts: Secondary | ICD-10-CM | POA: Diagnosis not present

## 2020-12-22 DIAGNOSIS — I10 Essential (primary) hypertension: Secondary | ICD-10-CM | POA: Diagnosis not present

## 2020-12-22 DIAGNOSIS — R262 Difficulty in walking, not elsewhere classified: Secondary | ICD-10-CM | POA: Diagnosis not present

## 2020-12-22 DIAGNOSIS — Z89612 Acquired absence of left leg above knee: Secondary | ICD-10-CM | POA: Diagnosis not present

## 2020-12-22 DIAGNOSIS — E785 Hyperlipidemia, unspecified: Secondary | ICD-10-CM | POA: Diagnosis not present

## 2020-12-22 NOTE — Patient Outreach (Signed)
Aging Gracefully Program  12/22/2020  TYREONA PANJWANI 11-Dec-1955 [id number]   Care Coordination: Placed call to patient who answered. Reports she got home on 12/10/2020.  Offered home visit and patient accepted for 12/23/2020 at 11am,.   Rowe Pavy, RN, BSN, CEN [facility] [employer] [phone]

## 2020-12-22 NOTE — Telephone Encounter (Signed)
Gave verbal orders to Will from [facility] to do OT 2x a week for 2 weeks. According to him, patient states she is no longer taking ensure, zetia, ferrous sulfate or gabapentin. Advised him we did not tell her to stop. Unsure if patient decided to stop on her own, or if another provider discontinued medication.

## 2020-12-23 ENCOUNTER — Other Ambulatory Visit: Payer: Self-pay

## 2020-12-23 NOTE — Patient Instructions (Signed)
Goals Addressed              This Visit's Progress   .  COMPLETED: Patient Stated (pt-stated)        Aging Gracefully: Patient states she would like to avoid falls in the next 90 days.   Interventions: reviewed fall precautions, reviewed importance of using assistive devices. Reviewed safety in bathroom where floor is wet.  Rowe Pavy, RN, BSN, CEN [facility] Community Care Coordinator [phone]   09/27/2020  Home visit completed. No recent falls. Currently in wheelchair or able to transfer to chair. Not currently using walker.   Interventions: Provided written amputee exercises. Reviewed knowledge of exercises with patient. Encouraged patient to do exercises daily.  Reviewed importance of safety in the home and avoid fall risk.    Rowe Pavy, RN, BSN, CEN [name] Community Care Coordinator [phone]    10/25/2020    Assessment:  Patient denies any falls. Patient reports she is being cautious. Review importance of continuing to do home exercises.  Interventions:  Reviewed fall precautions. Encouraged patient to continue to do home exercises. Rowe Pavy, RN, BSN, CEN [facility] Community Care Coordinator [phone]   12/23/2020 Assessment:  Now bilateral amputee. Doing well at home with PT and OT.  No pain.  Using wheelchair. PCS servies  2.5 hours per day.  No falls.  Interventions:  reviewed fall precautions and safety.   Rowe Pavy, RN, BSN, CEN [facility] [employer] [phone]

## 2020-12-23 NOTE — Patient Outreach (Signed)
Aging Gracefully Program  RN Visit  12/23/2020  Mercedes [name] [birth year] [id number]  Visit:   RN home visit #4  Start Time:   1100 End Time:   1145 Total Minutes:   45  Readiness To Change Score:     Universal RN Interventions: Calendar Distribution: Yes Exercise Review: Yes Medications: Yes Medication Changes: Yes Mood: Yes Pain: Yes PCP Advocacy/Support: No Fall Prevention: Yes Incontinence: Yes Clinician View Of Client Situation: Covid screening completed and negative.  Sitting on back porch in the sun. Client View Of His/Her Situation: Reports splitters from ramp. reports wants to talk to [employer]. Reports she had pain the right leg in March. went tot he hospital and needed to have leg amputated.  Went to rehab for 2 weeks and has been hoe since 12/09/2020.  Reports she refuses to allow herself to be depressed.No open wounds.  PT and OT at home with home health. Reports she will start striners soon and wants to be able to get her legs and start driing again.   No new problem or concerns.     reports transferring on and off toliet without difficulty.  Doing well getting in and out of bed.    Currently has a new caregiver and is swittching to [facility] within the next week.  Healthcare Provider Communication:no    Clinician View of Client Situation: Diplomatic Services operational officer Of Client Situation: Covid screening completed and negative.  Sitting on back porch in the sun. Client's View of His/Her Situation: Client View Of His/Her Situation: Reports splitters from ramp. reports wants to talk to [employer]. Reports she had pain the right leg in March. went tot he hospital and needed to have leg amputated.  Went to rehab for 2 weeks and has been hoe since 12/09/2020.  Reports she refuses to allow herself to be depressed.No open wounds.  PT and OT at home with home health. Reports she will start striners soon and wants to be able to get her legs and start  driing again.   No new problem or concerns.     reports transferring on and off toliet without difficulty.  Doing well getting in and out of bed.    Currently has a new caregiver and is swittching to [facility] within the next week.  Medication Assessment: updated medication list.     OT Update: updated during AG conference call today.   Session Summary: Do well. Has completed all nursing goals.  Will send email to [employer] about concerns with modifications.   Goals Addressed              This Visit's Progress   .  COMPLETED: Patient Stated (pt-stated)        Aging Gracefully: Patient states she would like to avoid falls in the next 90 days.   Interventions: reviewed fall precautions, reviewed importance of using assistive devices. Reviewed safety in bathroom where floor is wet.  Rowe Pavy, RN, BSN, CEN [facility] Community Care Coordinator [phone]   09/27/2020  Home visit completed. No recent falls. Currently in wheelchair or able to transfer to chair. Not currently using walker.   Interventions: Provided written amputee exercises. Reviewed knowledge of exercises with patient. Encouraged patient to do exercises daily.  Reviewed importance of safety in the home and avoid fall risk.    Rowe Pavy, RN, BSN, CEN [facility] Community Care Coordinator [phone]    10/25/2020    Assessment:  Patient denies any falls. Patient  reports she is being cautious. Review importance of continuing to do home exercises.  Interventions:  Reviewed fall precautions. Encouraged patient to continue to do home exercises. Rowe Pavy, RN, BSN, CEN [facility] Community Care Coordinator [phone]   12/23/2020 Assessment:  Now bilateral amputee. Doing well at home with PT and OT.  No pain.  Using wheelchair. PCS servies  2.5 hours per day.  No falls.  Interventions:  reviewed fall precautions and safety.   Rowe Pavy, RN, BSN, CEN [facility] Coordinator [phone]         Rowe Pavy, RN, BSN, CEN [facility] [phone]

## 2020-12-24 DIAGNOSIS — Z89411 Acquired absence of right great toe: Secondary | ICD-10-CM | POA: Diagnosis not present

## 2020-12-24 DIAGNOSIS — I10 Essential (primary) hypertension: Secondary | ICD-10-CM | POA: Diagnosis not present

## 2020-12-24 DIAGNOSIS — T82392D Other mechanical complication of femoral arterial graft (bypass), subsequent encounter: Secondary | ICD-10-CM | POA: Diagnosis not present

## 2020-12-24 DIAGNOSIS — R262 Difficulty in walking, not elsewhere classified: Secondary | ICD-10-CM | POA: Diagnosis not present

## 2020-12-24 DIAGNOSIS — R531 Weakness: Secondary | ICD-10-CM | POA: Diagnosis not present

## 2020-12-24 DIAGNOSIS — Z95820 Peripheral vascular angioplasty status with implants and grafts: Secondary | ICD-10-CM | POA: Diagnosis not present

## 2020-12-24 DIAGNOSIS — Z89612 Acquired absence of left leg above knee: Secondary | ICD-10-CM | POA: Diagnosis not present

## 2020-12-24 DIAGNOSIS — E785 Hyperlipidemia, unspecified: Secondary | ICD-10-CM | POA: Diagnosis not present

## 2020-12-27 DIAGNOSIS — Z4781 Encounter for orthopedic aftercare following surgical amputation: Secondary | ICD-10-CM | POA: Diagnosis not present

## 2020-12-27 DIAGNOSIS — I739 Peripheral vascular disease, unspecified: Secondary | ICD-10-CM | POA: Diagnosis not present

## 2020-12-28 DIAGNOSIS — I10 Essential (primary) hypertension: Secondary | ICD-10-CM | POA: Diagnosis not present

## 2020-12-28 DIAGNOSIS — E785 Hyperlipidemia, unspecified: Secondary | ICD-10-CM | POA: Diagnosis not present

## 2020-12-28 DIAGNOSIS — Z89612 Acquired absence of left leg above knee: Secondary | ICD-10-CM | POA: Diagnosis not present

## 2020-12-28 DIAGNOSIS — Z95820 Peripheral vascular angioplasty status with implants and grafts: Secondary | ICD-10-CM | POA: Diagnosis not present

## 2020-12-28 DIAGNOSIS — Z89411 Acquired absence of right great toe: Secondary | ICD-10-CM | POA: Diagnosis not present

## 2020-12-28 DIAGNOSIS — R262 Difficulty in walking, not elsewhere classified: Secondary | ICD-10-CM | POA: Diagnosis not present

## 2020-12-28 DIAGNOSIS — T82392D Other mechanical complication of femoral arterial graft (bypass), subsequent encounter: Secondary | ICD-10-CM | POA: Diagnosis not present

## 2020-12-28 DIAGNOSIS — R531 Weakness: Secondary | ICD-10-CM | POA: Diagnosis not present

## 2020-12-29 NOTE — Progress Notes (Deleted)
POST OPERATIVE OFFICE NOTE    CC:  F/u for surgery  HPI:  This is a 65 y.o. female who underwent extensive revascularization of right lower extremity for limb salvage during prior hospital admission.  She presented to the office with return of rest pain of right lower extremity and was directly admitted to the hospital for pain control.  She was found to have an occluded right lower extremity bypass.  At this point she had no further options for revascularization.  She eventually consented to right above-the-knee amputation which was performed by Dr. Darrick Penna on 11/23/2020.  Postoperative course consisted of pain control and working with therapy on transferring and upper body exercise.  The transitions of care team was consulted for skilled nursing facility placement.  She was accepted to [facility] nursing facility and insurance approved on 11/26/2020.   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 Darrick Penna.    Pt returns today for follow up.  Pt states ***  Allergies  Allergen Reactions  . Brilinta [Ticagrelor] Anaphylaxis, Shortness Of Breath and Other (See Comments)    Also, chest tightness  . Latex Rash  . Tape Rash    Current Outpatient Medications  Medication Sig Dispense Refill  . acetaminophen (TYLENOL) 500 MG tablet Take 500-1,000 mg by mouth every 8 (eight) hours as needed for mild pain or headache.    [name] aspirin EC 81 MG EC tablet Take 1 tablet (81 mg total) by mouth daily at 6 (six) AM. Swallow whole.    [name] ELIQUIS 5 MG TABS tablet TAKE 1 TABLET BY MOUTH TWICE DAILY (Patient taking differently: Take 5 mg by mouth 2 (two) times daily.) 180 tablet 1  . Ensure Max Protein (ENSURE MAX PROTEIN) LIQD Take 8 oz by mouth daily. (Patient not taking: Reported on 12/23/2020)    . ezetimibe (ZETIA) 10 MG tablet TAKE 1 TABLET (10 MG TOTAL) BY MOUTH DAILY. (Patient not taking: Reported on 12/23/2020) 90 tablet 3  . ferrous sulfate 325 (65 FE) MG tablet Take 1 tablet (325 mg total) by mouth 3  (three) times daily with meals. (Patient not taking: Reported on 12/23/2020) 90 tablet 1  . gabapentin (NEURONTIN) 300 MG capsule Take 1 capsule (300 mg total) by mouth 2 (two) times daily. (Patient not taking: Reported on 12/23/2020) 60 capsule 3  . metoprolol tartrate (LOPRESSOR) 25 MG tablet Take 0.5 tablets (12.5 mg total) by mouth 2 (two) times daily. (Patient taking differently: Take by mouth daily.) 180 tablet 3  . Multiple Vitamin (MULTIVITAMIN WITH MINERALS) TABS tablet Take 1 tablet by mouth daily. (Patient not taking: Reported on 12/23/2020)    . nitroGLYCERIN (NITROSTAT) 0.4 MG SL tablet Place 0.4 mg under the tongue every 4 (four) hours as needed for chest pain. (Patient not taking: Reported on 12/23/2020)    . oxyCODONE-acetaminophen (PERCOCET/ROXICET) 5-325 MG tablet Take 1 tablet by mouth every 6 (six) hours as needed for moderate pain. (Patient not taking: Reported on 12/23/2020) 30 tablet 0  . rosuvastatin (CRESTOR) 40 MG tablet Take 40 mg by mouth at bedtime.     No current facility-administered medications for this visit.     ROS:  See HPI  Physical Exam:  ***  Incision:  *** Extremities:  *** Neuro: *** Abdomen:  ***   Assessment/Plan:  This is a 65 y.o. female who is s/p: Right AKA 11/23/2020 by Dr. Darrick Penna.  She has hx of left AKA on 10/01/2019 by Dr. Darrick Penna  -***   Doreatha Massed, [facility]  [facility] [phone]   Clinic MD:  Darrick Penna

## 2020-12-29 NOTE — Telephone Encounter (Signed)
Will from [facility] called back to report that patient is refusing OT and would like to be discharged from their service.

## 2021-01-04 ENCOUNTER — Other Ambulatory Visit: Payer: Self-pay | Admitting: Occupational Therapy

## 2021-01-05 ENCOUNTER — Other Ambulatory Visit: Payer: Self-pay

## 2021-01-05 ENCOUNTER — Telehealth: Payer: Self-pay

## 2021-01-05 ENCOUNTER — Telehealth: Payer: Self-pay | Admitting: Internal Medicine

## 2021-01-05 DIAGNOSIS — Z95820 Peripheral vascular angioplasty status with implants and grafts: Secondary | ICD-10-CM | POA: Diagnosis not present

## 2021-01-05 DIAGNOSIS — I10 Essential (primary) hypertension: Secondary | ICD-10-CM | POA: Diagnosis not present

## 2021-01-05 DIAGNOSIS — E785 Hyperlipidemia, unspecified: Secondary | ICD-10-CM | POA: Diagnosis not present

## 2021-01-05 DIAGNOSIS — R531 Weakness: Secondary | ICD-10-CM | POA: Diagnosis not present

## 2021-01-05 DIAGNOSIS — Z89612 Acquired absence of left leg above knee: Secondary | ICD-10-CM | POA: Diagnosis not present

## 2021-01-05 DIAGNOSIS — R262 Difficulty in walking, not elsewhere classified: Secondary | ICD-10-CM | POA: Diagnosis not present

## 2021-01-05 DIAGNOSIS — T82392D Other mechanical complication of femoral arterial graft (bypass), subsequent encounter: Secondary | ICD-10-CM | POA: Diagnosis not present

## 2021-01-05 DIAGNOSIS — Z89411 Acquired absence of right great toe: Secondary | ICD-10-CM | POA: Diagnosis not present

## 2021-01-05 NOTE — Patient Outreach (Signed)
Aging Gracefully Program  OT Follow-Up Visit  01/05/2021  Mercedes [name] 01-29-1956 [id number]  Visit:  3- Third Visit  Start Time:  1600 End Time:  1705 Total Minutes:  65    Readiness to Change Score :  Readiness to Change Score: 10   Durable Medical Equipment: Adaptive Equipment: Reacher Adaptive Equipment Distribution Date: 01/04/21   Goals:  Goals Addressed            This Visit's Progress   . Patient Stated   On track    She would like to be able to get in and out of her house at front and back doors by herself (needs ramps and would like and area to sit out on at back of house. ([facility] home modification worker is coming tomorrow to look at the difficulty she is having get in and out her back door do to threshold and splintering on rails of ramp).    . Patient Stated   On track    Be able to get to the curb from her house at the front door when friends/family pick up her (needs something to go over the stepping stones so she can either get her wheelchair or rolling walker down it.  (she is able to get in and out of her front door but with decreased safety now due to being a bilateral AKA--she is to talk to [facility] home modification worker to look at it tomorrow when he comes to look at back door threshold and ramp rails).    . COMPLETED: Patient Stated       She would like to be able to get items in and out of her upper cabinets from wheelchair level--a reacher and moving heavier items lower would make this Mod independent for her. (goal added due to pt is a recent bilateral AKA, where as when she started in the program she was only a left AKA)  ACTION PLANNING  Target Problem Area: Cannot reach items up high   Why Problem May Occur: Client is recently a bilateral lower extremity amputee   Target Goal: Independence with reaching items   STRATEGIES  Modifying your home environment and making it safe: DO: DON'T:  Lower items you need regularly to lower surfaces  Have items too high up to reach from seated position.  Do use reacher to get non breakable lighter weight items (regular [employer] soup can or less weight) that are higher than you can reach from seated position. Use reacher to try and get heavier items from high surfaces.   Practice It is important to practice the strategies so we can determine if they will be effective in helping to reach your goal. Follow these specific recommendations: 1. Use reacher for all items higher than you can reacher from seated position that are also light weight enough to easily get with reacher. Items that are too heavy you may drop and hurt yourself. 2. Put all items that are needed regularly and that are heavy low enough to reach from seated position.   If a strategy does not work the first time, try it again and again (and maybe again). We may make some changes over the next few sessions, based on how they work.   Ignacia Palma, OTR/L      01/04/2021         Post Clinical Reasoning: Client Action (Goal) Four Interventions: Patient provided reacher and was also assisted with re-arranging her items in her upper cabinets so she can  either reacher them by hand or with the reacher Did Client Try?: Yes Targeted Problem Area Status: A Lot Better Clinician View Of Client Situation:: Mercedes [name] is adjusting to being a Bil lower extremity amputee. We discussed using a cutting board as a "tray" on her lap (she had one available and she tried it and understood how this was an easier and safer way to transport items on her lap). We also re-looked at her bathroom and toilet transfers--she is currenlty doing lateral transfers to her 3n1 (having to go over the wheel of wheelchair--which could cause skin tears on her backside and she reports hitting her right residual limb on tire as well). I discussed an anterior/posterior transfer with her and talked her through how she would do it. She could not practice with me due to  there was a shelf unit in the way (that she will consider moving) to try it this way. She did verbalize understanding. We also discussed that as soon as her right residual limb was healed she needed to work with PT not only on getting a prothesis but also floor to wheelchair transfers. We did discuss the concept of how she would do this by stacking couch cushions in layers to get up on her couch then to wheelchair from couch. In a phone call prior to my visit we discussed if she needed a seatbelt for her wheelchair now due to center of balance being very different now. At that time she did not feel she needed one, however she had an incident going out her front door where she said she almost did fall out of her wheelchair. She asked abuot the concept of the wheelchair seat belt and I explained it to her. I asked her if she wanted me to get her one and she said no that she would just use a belt. Mercedes [name] is very good at problem solving and has has to do alot of his over the past 2 years with all the medical issuse she has had. Client View Of His/Her Situation:: Mercedes [name] feels she is doing very well given her latest amputaion and is looking foreward to getting a prothesitic/protheses. She currenlty has one for her LLE but it is too heavy for her she says. She is talking about using another company for her RLE. She took the iniative to change her [facility] Aide agency due to her prior agency was not reliable about having someone to send to help her. Next Visit Plan:: This way be my last visit with Mercedes [name]. I need to follow up on a couple of things to make sure.   Ignacia Palma, OTR/L Aging Gracefully [phone]

## 2021-01-05 NOTE — Patient Instructions (Addendum)
Be able to get to the curb from her house at the front door when friends/family pick up her (needs something to go over the stepping stones so she can either get her wheelchair or rolling walker down it.  (she is able to get in and out of her front door but with decreased safety now due to being a bilateral AKA--she is to talk to [facility] home modification worker to look at it tomorrow when he comes to look at back door threshold and ramp rails).  She would like to be able to get in and out of her house at front and back doors by herself (needs ramps and would like and area to sit out on at back of house. ([facility] home modification worker is coming tomorrow to look at the difficulty she is having get in and out her back door do to threshold and splintering on rails of ramp).   She would like to be able to get items in and out of her upper cabinets from wheelchair level--a reacher and moving heavier items lower would make this Mod independent for her. (goal added due to pt is a recent bilateral AKA, where as when she started in the program she was only a left AKA)  ACTION PLANNING  Target Problem Area: Cannot reach items up high   Why Problem May Occur: Client is recently a bilateral lower extremity amputee   Target Goal: Independence with reaching items   STRATEGIES  Modifying your home environment and making it safe: DO: DON'T:  Lower items you need regularly to lower surfaces Have items too high up to reach from seated position.  Do use reacher to get non breakable lighter weight items (regular Campbell[employer] soup can or less weight) that are higher than you can reach from seated position. Use reacher to try and get heavier items from high surfaces.   Practice It is important to practice the strategies so we can determine if they will be effective in helping to reach your goal. Follow these specific recommendations: 1. Use reacher for all items higher than you can reacher from seated position that  are also light weight enough to easily get with reacher. Items that are too heavy you may drop and hurt yourself. 2. Put all items that are needed regularly and that are heavy low enough to reach from seated position.   If a strategy does not work the first time, try it again and again (and maybe again). We may make some changes over the next few sessions, based on how they work.   Ignacia Palma, OTR/L      01/04/2021

## 2021-01-05 NOTE — Telephone Encounter (Signed)
Gave verbal okay to [place] for [facility] to do PT with patient 2x a week for 2 weeks and 1 x a week for 3 weeks.

## 2021-01-05 NOTE — Telephone Encounter (Signed)
Mercedes [name] with [facility] calling, Patient needs a Powered wheel chair to help with independent mobility.[employer] evaluated and they will be the ones who provide the wheelchair  Would also like a re certification for hh pt to work on transfers

## 2021-01-05 NOTE — Telephone Encounter (Signed)
Okay to recertify home health   Okay-does she need just a prescription for the powered wheelchair?

## 2021-01-06 NOTE — Telephone Encounter (Signed)
Called and gave [place] verbal for recert.  [employer] will be faxing over forms for Dr. Lawerance Bach to complete regarding chair.

## 2021-01-10 ENCOUNTER — Other Ambulatory Visit: Payer: Self-pay

## 2021-01-10 ENCOUNTER — Ambulatory Visit (INDEPENDENT_AMBULATORY_CARE_PROVIDER_SITE_OTHER): Payer: Medicare Other | Admitting: Physician Assistant

## 2021-01-10 VITALS — BP 141/71 | HR 51 | Temp 96.2°F | Resp 14 | Wt 92.0 lb

## 2021-01-10 DIAGNOSIS — Z89612 Acquired absence of left leg above knee: Secondary | ICD-10-CM

## 2021-01-10 NOTE — Progress Notes (Signed)
    Postoperative Visit    History of Present Illness   Mercedes [name] is a 65 y.o. female who presents for postoperative follow-up for: right above-the-knee amputation by Dr. Darrick Penna (Date: 11/23/20).  The patient's wounds are healed.  The patient notes pain is well controlled.  She is currently wheelchair-bound however would like to wear at least 1 prosthetic to be able to walk with a walker.   For VQI Use Only   PRE-ADM LIVING: Home  AMB STATUS: Wheelchair   Physical Examination   Vitals:   01/10/21 1211  BP: (!) 141/71  Pulse: (!) 51  Resp: 14  Temp: (!) 96.2 F (35.7 C)  SpO2: 97%    RLE: Stump incision is healed.  Staples are intact.   Medical Decision Making   Mercedes [name] is a 65 y.o. female who presents s/p right above-the-knee amputation.   Right AKA incision is well-healed; all staples were removed  Patient was referred to [facility] clinic for evaluation for prosthetic leg use; okay from our standpoint to begin wearing a shrinker on the right lower extremity  She will follow-up on an as-needed basis  Emilie Rutter PA-C [facility] [place] Office: [phone]  Clinic MD: Myra Gianotti

## 2021-01-11 DIAGNOSIS — Z4781 Encounter for orthopedic aftercare following surgical amputation: Secondary | ICD-10-CM | POA: Diagnosis not present

## 2021-01-11 DIAGNOSIS — I10 Essential (primary) hypertension: Secondary | ICD-10-CM | POA: Diagnosis not present

## 2021-01-11 DIAGNOSIS — R531 Weakness: Secondary | ICD-10-CM | POA: Diagnosis not present

## 2021-01-11 DIAGNOSIS — Z89611 Acquired absence of right leg above knee: Secondary | ICD-10-CM | POA: Diagnosis not present

## 2021-01-11 DIAGNOSIS — E785 Hyperlipidemia, unspecified: Secondary | ICD-10-CM | POA: Diagnosis not present

## 2021-01-11 DIAGNOSIS — Z89612 Acquired absence of left leg above knee: Secondary | ICD-10-CM | POA: Diagnosis not present

## 2021-01-11 DIAGNOSIS — R262 Difficulty in walking, not elsewhere classified: Secondary | ICD-10-CM | POA: Diagnosis not present

## 2021-01-12 DIAGNOSIS — Z89612 Acquired absence of left leg above knee: Secondary | ICD-10-CM | POA: Diagnosis not present

## 2021-01-12 DIAGNOSIS — E785 Hyperlipidemia, unspecified: Secondary | ICD-10-CM | POA: Diagnosis not present

## 2021-01-12 DIAGNOSIS — R531 Weakness: Secondary | ICD-10-CM | POA: Diagnosis not present

## 2021-01-12 DIAGNOSIS — R262 Difficulty in walking, not elsewhere classified: Secondary | ICD-10-CM | POA: Diagnosis not present

## 2021-01-12 DIAGNOSIS — I10 Essential (primary) hypertension: Secondary | ICD-10-CM | POA: Diagnosis not present

## 2021-01-12 DIAGNOSIS — Z4781 Encounter for orthopedic aftercare following surgical amputation: Secondary | ICD-10-CM | POA: Diagnosis not present

## 2021-01-12 DIAGNOSIS — Z89611 Acquired absence of right leg above knee: Secondary | ICD-10-CM | POA: Diagnosis not present

## 2021-01-17 ENCOUNTER — Telehealth: Payer: Self-pay | Admitting: Internal Medicine

## 2021-01-17 NOTE — Telephone Encounter (Signed)
Tried to reach [employer]. Forms re-faxed to where they needed to go.

## 2021-01-17 NOTE — Telephone Encounter (Signed)
Renea Ee w/ [employer] called and said that they received paper work for the patient for [employer]. She said that the patient is not a patient of theirs. Please advise   Phone: [phone]

## 2021-01-18 DIAGNOSIS — Z4781 Encounter for orthopedic aftercare following surgical amputation: Secondary | ICD-10-CM | POA: Diagnosis not present

## 2021-01-18 DIAGNOSIS — Z89612 Acquired absence of left leg above knee: Secondary | ICD-10-CM | POA: Diagnosis not present

## 2021-01-18 DIAGNOSIS — R531 Weakness: Secondary | ICD-10-CM | POA: Diagnosis not present

## 2021-01-18 DIAGNOSIS — E785 Hyperlipidemia, unspecified: Secondary | ICD-10-CM | POA: Diagnosis not present

## 2021-01-18 DIAGNOSIS — R262 Difficulty in walking, not elsewhere classified: Secondary | ICD-10-CM | POA: Diagnosis not present

## 2021-01-18 DIAGNOSIS — Z89611 Acquired absence of right leg above knee: Secondary | ICD-10-CM | POA: Diagnosis not present

## 2021-01-18 DIAGNOSIS — I10 Essential (primary) hypertension: Secondary | ICD-10-CM | POA: Diagnosis not present

## 2021-01-19 ENCOUNTER — Telehealth: Payer: Self-pay | Admitting: Internal Medicine

## 2021-01-19 NOTE — Telephone Encounter (Signed)
Aram Beecham from [employer] called   The orders that were fax to them were not signed. Requesting they be resigned and faxed back to them. I asked her to re-fax the order to [place] so we can make sure we have all the forms needed.   Please fax to [phone]

## 2021-01-20 DIAGNOSIS — E785 Hyperlipidemia, unspecified: Secondary | ICD-10-CM | POA: Diagnosis not present

## 2021-01-20 DIAGNOSIS — Z4781 Encounter for orthopedic aftercare following surgical amputation: Secondary | ICD-10-CM | POA: Diagnosis not present

## 2021-01-20 DIAGNOSIS — R262 Difficulty in walking, not elsewhere classified: Secondary | ICD-10-CM | POA: Diagnosis not present

## 2021-01-20 DIAGNOSIS — R531 Weakness: Secondary | ICD-10-CM | POA: Diagnosis not present

## 2021-01-20 DIAGNOSIS — Z89611 Acquired absence of right leg above knee: Secondary | ICD-10-CM | POA: Diagnosis not present

## 2021-01-20 DIAGNOSIS — I10 Essential (primary) hypertension: Secondary | ICD-10-CM | POA: Diagnosis not present

## 2021-01-20 DIAGNOSIS — Z89612 Acquired absence of left leg above knee: Secondary | ICD-10-CM | POA: Diagnosis not present

## 2021-01-20 NOTE — Telephone Encounter (Signed)
Paperwork located and placed in Dr. Lawerance Bach box to sign so I can fax back.

## 2021-01-20 NOTE — Telephone Encounter (Signed)
Paperwork faxed back today with fax conformation received.

## 2021-01-21 NOTE — Telephone Encounter (Signed)
Spoke with Aram Beecham today. She is going to refax page that is missing since paperwork contains 21 pages.

## 2021-01-21 NOTE — Telephone Encounter (Signed)
Mercedes [name] stated one page is missing the description of equipment type order (power mobility chair); in the PMD standard written order section  She said you may fill out the one page and just fax just that page back over

## 2021-01-21 NOTE — Telephone Encounter (Signed)
Paper refaxed today and conformation received.

## 2021-01-26 DIAGNOSIS — Z89611 Acquired absence of right leg above knee: Secondary | ICD-10-CM | POA: Diagnosis not present

## 2021-01-26 DIAGNOSIS — R262 Difficulty in walking, not elsewhere classified: Secondary | ICD-10-CM | POA: Diagnosis not present

## 2021-01-26 DIAGNOSIS — Z4781 Encounter for orthopedic aftercare following surgical amputation: Secondary | ICD-10-CM | POA: Diagnosis not present

## 2021-01-26 DIAGNOSIS — E785 Hyperlipidemia, unspecified: Secondary | ICD-10-CM | POA: Diagnosis not present

## 2021-01-26 DIAGNOSIS — R531 Weakness: Secondary | ICD-10-CM | POA: Diagnosis not present

## 2021-01-26 DIAGNOSIS — Z89612 Acquired absence of left leg above knee: Secondary | ICD-10-CM | POA: Diagnosis not present

## 2021-01-26 DIAGNOSIS — I10 Essential (primary) hypertension: Secondary | ICD-10-CM | POA: Diagnosis not present

## 2021-02-01 DIAGNOSIS — Z4781 Encounter for orthopedic aftercare following surgical amputation: Secondary | ICD-10-CM | POA: Diagnosis not present

## 2021-02-01 DIAGNOSIS — Z89611 Acquired absence of right leg above knee: Secondary | ICD-10-CM | POA: Diagnosis not present

## 2021-02-01 DIAGNOSIS — R531 Weakness: Secondary | ICD-10-CM | POA: Diagnosis not present

## 2021-02-01 DIAGNOSIS — E785 Hyperlipidemia, unspecified: Secondary | ICD-10-CM | POA: Diagnosis not present

## 2021-02-01 DIAGNOSIS — I10 Essential (primary) hypertension: Secondary | ICD-10-CM | POA: Diagnosis not present

## 2021-02-01 DIAGNOSIS — Z89612 Acquired absence of left leg above knee: Secondary | ICD-10-CM | POA: Diagnosis not present

## 2021-02-01 DIAGNOSIS — R262 Difficulty in walking, not elsewhere classified: Secondary | ICD-10-CM | POA: Diagnosis not present

## 2021-02-09 ENCOUNTER — Telehealth: Payer: Self-pay | Admitting: Internal Medicine

## 2021-02-09 NOTE — Chronic Care Management (AMB) (Signed)
  Chronic Care Management   Note  02/09/2021 Name: PRINCE OLIVIER MRN: [id number] DOB: [birth year]  [name] is a 65 y.o. year old female who is a primary care patient of Burns, Bobette Mo, MD. I reached out to [name] by phone today in response to a referral sent by Ms. Kallie Locks Mcmorris's PCP, Pincus Sanes, MD.   Ms. Bumpass was given information about Chronic Care Management services today including:  CCM service includes personalized support from designated clinical staff supervised by her physician, including individualized plan of care and coordination with other care providers 24/7 contact phone numbers for assistance for urgent and routine care needs. Service will only be billed when office clinical staff spend 20 minutes or more in a month to coordinate care. Only one practitioner may furnish and bill the service in a calendar month. The patient may stop CCM services at any time (effective at the end of the month) by phone call to the office staff.   Patient agreed to services and verbal consent obtained.   Follow up plan:   Carmell [place] [employer] Scheduler

## 2021-02-11 DIAGNOSIS — R262 Difficulty in walking, not elsewhere classified: Secondary | ICD-10-CM | POA: Diagnosis not present

## 2021-02-11 DIAGNOSIS — Z89612 Acquired absence of left leg above knee: Secondary | ICD-10-CM | POA: Diagnosis not present

## 2021-02-11 DIAGNOSIS — Z89611 Acquired absence of right leg above knee: Secondary | ICD-10-CM | POA: Diagnosis not present

## 2021-02-11 DIAGNOSIS — Z4781 Encounter for orthopedic aftercare following surgical amputation: Secondary | ICD-10-CM | POA: Diagnosis not present

## 2021-02-11 DIAGNOSIS — R531 Weakness: Secondary | ICD-10-CM | POA: Diagnosis not present

## 2021-02-11 DIAGNOSIS — I10 Essential (primary) hypertension: Secondary | ICD-10-CM | POA: Diagnosis not present

## 2021-02-11 DIAGNOSIS — E785 Hyperlipidemia, unspecified: Secondary | ICD-10-CM | POA: Diagnosis not present

## 2021-02-25 DIAGNOSIS — Z4781 Encounter for orthopedic aftercare following surgical amputation: Secondary | ICD-10-CM | POA: Diagnosis not present

## 2021-02-25 DIAGNOSIS — I739 Peripheral vascular disease, unspecified: Secondary | ICD-10-CM | POA: Diagnosis not present

## 2021-02-28 NOTE — Patient Instructions (Addendum)
  Blood work was ordered.     Medications changes include :  prednisone taper   Your prescription(s) have been submitted to your pharmacy. Please take as directed and contact our office if you believe you are having problem(s) with the medication(s).

## 2021-02-28 NOTE — Progress Notes (Signed)
Subjective:    Patient ID: Mercedes [name], female    DOB: [birth year], 65 y.o.   MRN: [id number]  HPI The patient is here for an acute visit.   Leg pain  - both legs hurt and are slightly swollen.  One month ago both legs started hurting, and were slightly swollen.  Her right leg is still slightly swollen, but the left leg is better.  The rash is on her arms, legs and torso.  She thinks it starting on her face.  It reminds her of her lupus flare that she had years ago.  She states after she had her left leg amputation she had a lupus flare and did not see anyone at that time.  She was not surprised that she has another flare after her right leg amputation.  She typically only has the leg pain when she lays down and she is having difficulty sleeping.   No arm pain.  No increased fatigue, but energy comes and goes.  No numbness.  No LBP.    Medications and allergies reviewed with patient and updated if appropriate.  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     aspirin EC 81 MG EC tablet Take 1 tablet (81 mg total) by mouth daily at 6 (six) AM. Swallow whole.     ELIQUIS 5 MG TABS tablet TAKE 1 TABLET BY MOUTH TWICE DAILY (Patient taking differently: Take 5 mg by mouth 2 (two) times daily.) 180 tablet 1   Ensure Max Protein (ENSURE MAX PROTEIN) LIQD Take 8 oz by mouth daily.     gabapentin (NEURONTIN) 300 MG capsule Take 1 capsule (300 mg total) by mouth 2 (two) times daily. 60 capsule 3   metoprolol tartrate (LOPRESSOR) 25 MG tablet Take 0.5 tablets (12.5 mg total) by mouth 2 (two) times daily. (  Patient taking differently: Take by mouth daily.) 180 tablet 3   Multiple Vitamin (MULTIVITAMIN WITH MINERALS) TABS tablet Take 1 tablet by mouth daily.     nitroGLYCERIN (NITROSTAT) 0.4 MG SL tablet Place 0.4 mg under the tongue every 4 (four) hours as needed for chest pain.     rosuvastatin (CRESTOR) 40 MG tablet Take 40 mg by mouth at bedtime.     No current facility-administered medications on file prior to visit.    Past Medical History:  Diagnosis Date   Abnormal LFTs    a. in the past when on Lipitor   Anemia    as a young woman   Anxiety    Arnold-Chiari  malformation, type I (HCC)    MRI brain 02/2010   CAD (coronary artery disease)    a. NSTEMI 07/2009: LHC - D2 40%, LAD irreg., EF 50% with apical AK (tako-tsubo CM);  b. Inf STEMI (04/2013): Tx Promus DES to CFX;  c. 08/2013 Lexi CL: No ischemia, dist ant/ap/inf-ap infarct, EF  d. 06/2016 NSTEMI with LM disease: s/p CABG on 07/17/2016 w/ LIMA-LAD, SVG-OM, and SVG-dRCA.   DEPRESSION    Dizziness    ? 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 anticoagulated with Eliquis 10/2014   Presence of permanent cardiac pacemaker 10/2014   leadless permanent pacemaker   RA (rheumatoid arthritis) (HCC)    prior tx by Dr. Dareen Piano   Sjogren's syndrome (HCC) 06/02/13   pt denies this (12/01/13)   SLE (systemic lupus erythematosus) (HCC) dx 05/2013   follows with rheum Dareen Piano)   Tachy-brady syndrome [facility])    a. 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   f/u echo 09/2009: EF 50-55%, mild LVH, mod diast dysfxn, mild apical HK   Wears dentures    Wears glasses     Past Surgical History:  Procedure Laterality Date   ABDOMINAL AORTAGRAM N/A 11/24/2013   Procedure: ABDOMINAL AORTAGRAM WITH BILATERAL RUNOFF WITH POSSIBLE INTERVENTION;  Surgeon: Chuck Hint, MD;  Location: [facility] CATH LAB;  Service: Cardiovascular;  Laterality: N/A;   ABDOMINAL AORTOGRAM W/LOWER EXTREMITY N/A 04/25/2019   Procedure: ABDOMINAL AORTOGRAM W/LOWER EXTREMITY;  Surgeon: Sherren Kerns, MD;  Location: [facility] INVASIVE CV LAB;  Service: Cardiovascular;  Laterality: N/A;   ABDOMINAL AORTOGRAM W/LOWER EXTREMITY Bilateral 05/15/2019   Procedure:  ABDOMINAL AORTOGRAM W/LOWER EXTREMITY;  Surgeon: Nada Libman, MD;  Location: [facility] INVASIVE CV LAB;  Service: Cardiovascular;  Laterality: Bilateral;   ABDOMINAL AORTOGRAM W/LOWER EXTREMITY N/A 08/23/2020   Procedure: ABDOMINAL AORTOGRAM W/LOWER EXTREMITY;  Surgeon: Maeola Harman, MD;  Location: [facility] INVASIVE CV LAB;  Service: Cardiovascular;  Laterality: N/A;   ABDOMINAL AORTOGRAM W/LOWER EXTREMITY N/A 10/29/2020   Procedure: ABDOMINAL AORTOGRAM W/LOWER EXTREMITY;  Surgeon: Sherren Kerns, MD;  Location: [facility] INVASIVE CV LAB;  Service: Cardiovascular;  Laterality: N/A;   AMPUTATION Left 12/02/2013   Procedure: LEFT 1ST TOE AMPUTATION;  Surgeon: Sherren Kerns, MD;  Location: [facility] OR;  Service: Vascular;  Laterality: Left;   AMPUTATION Left 07/16/2019   Procedure: LEFT TRANSMETATARSAL AMPUTATION;  Surgeon: Nadara Mustard, MD;  Location: [facility] OR;  Service: Orthopedics;  Laterality: Left;   AMPUTATION Left 10/01/2019   Procedure: LEFT AMPUTATION ABOVE KNEE;  Surgeon: Sherren Kerns, MD;  Location: [facility] OR;  Service: Vascular;  Laterality: Left;   AMPUTATION Right 08/31/2020   Procedure: Right first toe amputation;  Surgeon: Sherren Kerns, MD;  Location: [facility] OR;  Service: Vascular;  Laterality: Right;   AMPUTATION Right 11/23/2020   Procedure: RIGHT AMPUTATION ABOVE KNEE;  Surgeon: Sherren Kerns, MD;  Location: [facility] OR;  Service: Vascular;  Laterality: Right;   APPLICATION OF WOUND VAC Left 11/03/2019   Procedure: Application Of Wound Vac, left lower extremity;  Surgeon: Sherren Kerns, MD;  Location: [facility] OR;  Service: Vascular;  Laterality: Left;   CARDIAC CATHETERIZATION N/A 07/12/2016   Procedure: Left Heart Cath and Coronary Angiography;  Surgeon: Lennette Bihari, MD;  [facility]: [facility] INVASIVE CV LAB;  Service: Cardiovascular;  Laterality: N/A;   CARDIAC CATHETERIZATION N/A 07/14/2016   Procedure: Intravascular Ultrasound/IVUS;  Surgeon: Yvonne Kendall, MD;  Location: [facility] INVASIVE CV LAB;   Service: Cardiovascular;  Laterality: N/A;   COLONOSCOPY     CORONARY ANGIOPLASTY  04/2013   CORONARY ARTERY BYPASS GRAFT N/A 07/17/2016   Procedure: CORONARY ARTERY BYPASS GRAFTING (CABG)x3 using right greater saphenous vein harvested endoscopiclly and left internal mammary artery;  Surgeon: Kerin Perna, MD;  Location: [facility] OR;  Service: Open Heart Surgery;  Laterality: N/A;   ENDARTERECTOMY FEMORAL Left 05/12/2019   Procedure: LEFT Femoral Endarterectomy;  Surgeon: Sherren Kerns, MD;  Location: [facility] OR;  Service: Vascular;  Laterality: Left;   FEMORAL-POPLITEAL BYPASS GRAFT Left 12/02/2013   Procedure:  LEFT FEMORAL-BELOW KNEE POPLITEAL ARTERY BYPASS GRAFT;  Surgeon: Sherren Kerns, MD;  Location: [facility] OR;  Service: Vascular;  Laterality: Left;   FEMORAL-POPLITEAL BYPASS GRAFT Left 09/25/2019   Procedure: THROMBECTOMY and PROPATEN BYPASS  FEMORAL TO BELOW KNEE POPLITEAL ARTERY BYPASS GRAFT LEFT LEG;  Surgeon: Larina Earthly, MD;  [facility] OR;  Service: Vascular;  Laterality: Left;   FEMORAL-POPLITEAL BYPASS GRAFT Right 08/24/2020   Procedure: RIGHT FEMORAL-BELOW KNEE POPLITEAL ARTERY BYPASS USING GORE PROPATEN GRAFT;  Surgeon: Maeola Harman, MD;  Location: [facility] OR;  Service: Vascular;  Laterality: Right;   FEMORAL-POPLITEAL BYPASS GRAFT Right 11/01/2020   Procedure: Thrombectomy and Revision of Right Femoral to below knee Bypass with Interpostional graft to Tibial / peroneal Trunk and Endarterectomy Tibial Weston Settle Trunk.;  Surgeon: Sherren Kerns, MD;  Location: [facility] OR;  Service: Vascular;  Laterality: Right;   INTRAOPERATIVE ARTERIOGRAM Right 11/01/2020   Procedure: ANTEGRADE RIGHT LOWER EXTREMITY INTRA OPERATIVE ARTERIOGRAM;  Surgeon: Sherren Kerns, MD;  Location: [facility] OR;  Service: Vascular;  Laterality: Right;   LEFT HEART CATHETERIZATION WITH CORONARY ANGIOGRAM N/A 05/19/2013   Procedure: LEFT HEART CATHETERIZATION WITH CORONARY ANGIOGRAM;  Surgeon: Micheline Chapman, MD;   Location: [facility] CATH LAB;  Service: Cardiovascular;  Laterality: N/A;   LEFT HEART CATHETERIZATION WITH CORONARY ANGIOGRAM N/A 10/28/2014   Procedure: LEFT HEART CATHETERIZATION WITH CORONARY ANGIOGRAM;  Surgeon: Iran Ouch, MD;  [facility] CATH LAB;  Service: Cardiovascular;  Laterality: N/A;   LOWER EXTREMITY ANGIOGRAM Left 05/12/2019   Procedure: Lower Extremity Angiogram;  Surgeon: Sherren Kerns, MD;  Location: [facility] OR;  Service: Vascular;  Laterality: Left;   PATCH ANGIOPLASTY Left 05/12/2019   Procedure: Left Femoral Patch Angioplasty USING CADAVERIC SAPHENOUS VEIN;  Surgeon: Sherren Kerns, MD;  Location: [facility]  OR;  Service: Vascular;  Laterality: Left;   PERCUTANEOUS CORONARY STENT INTERVENTION (PCI-S)  05/19/2013   Procedure: PERCUTANEOUS CORONARY STENT INTERVENTION (PCI-S);  Surgeon: Micheline Chapman, MD;  Location: [facility] CATH LAB;  Service: Cardiovascular;;   PERIPHERAL VASCULAR INTERVENTION Right 10/29/2020   Procedure: PERIPHERAL VASCULAR INTERVENTION;  Surgeon: Sherren Kerns, MD;  Location: [facility] INVASIVE CV LAB;  Service: Cardiovascular;  Laterality: Right;  common iliac   PERMANENT PACEMAKER INSERTION N/A 11/04/2014   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   TEE WITHOUT CARDIOVERSION N/A 07/17/2016   Procedure: TRANSESOPHAGEAL ECHOCARDIOGRAM (TEE);  Surgeon: Kerin Perna, MD;  Location: [facility] OR;  Service: Open Heart Surgery;  Laterality: N/A;   THROMBECTOMY FEMORAL ARTERY Left 05/12/2019   Procedure: Left Ilio-Femoral Artery and Femoral-popliteal Graft Thrombectomy;  Surgeon: Sherren Kerns, MD;  Location: [facility] OR;  Service: Vascular;  Laterality: Left;   THROMBECTOMY OF BYPASS GRAFT FEMORAL- POPLITEAL ARTERY Right 11/01/2020   Procedure: POSSIBLE THROMBOLYSIS VERSUS OPEN THROMBECTOMY AND REVSION OF RIGHT FEMORAL-POPLITEAL ARTERY BYPASS;  Surgeon: Sherren Kerns, MD;  Location[facility] OR;  Service: Vascular;  Laterality: Right;   TUBAL LIGATION     VISCERAL  ANGIOGRAPHY N/A 04/25/2019   Procedure: MESENTRIC  ANGIOGRAPHY;  Surgeon: Sherren Kerns, MD;  [facility] INVASIVE CV LAB;  Service: Cardiovascular;  Laterality: N/A;   WOUND DEBRIDEMENT Left 11/03/2019   Procedure: Debridement Wound of left above the knee amputation;  Surgeon: Sherren Kerns, MD;  Location: [facility] OR;  Service: Vascular;  Laterality: Left;    Social History   Socioeconomic History   Marital status: Single    Spouse name: Not on file   Number of children: 3   Years of education: Not on file   Highest education level: Not on file  Occupational History   Occupation: retired  Tobacco Use   Smoking status: Former    Pack years: 0.00    Types: Cigarettes    Quit date: 2019    Years since quitting: 3.5   Smokeless tobacco: Never   Tobacco comments:       Vaping Use   Vaping Use: Never used  Substance and Sexual Activity   Alcohol use: No    Alcohol/week: 0.0 standard drinks   Drug use: Not Currently    Types: Marijuana, Other-see comments    Comment:  and CBD oil  last use of both was 10/29/19   Sexual activity: Not on file  Other Topics Concern   Not on file  Social History Narrative   Lives alone.   Social Determinants of Health   Financial Resource Strain: Low Risk    Difficulty of Paying Living Expenses: Not hard at all  Food Insecurity: No Food Insecurity   Worried About Programme researcher, broadcasting/film/video in the Last Year: Never true   Ran Out of Food in the Last Year: Never true  Transportation Needs: No Transportation Needs   Lack of Transportation (Medical): No   Lack of Transportation (Non-Medical): No  Physical Activity: Inactive   Days of Exercise per Week: 0 days   Minutes of Exercise per Session: 0 min  Stress: No Stress Concern Present   Feeling of Stress : Not at all  Social Connections: Unknown   Frequency of Communication with Friends and Family: More than three times a week   Frequency of Social Gatherings with Friends and Family: Once a week    Attends Religious Services: Patient refused   Active Member of  Clubs or Organizations: Patient refused   Attends Engineer, structural: Patient refused   Marital Status: Never married    Family History  Problem Relation Age of Onset   Arthritis Mother    Emphysema Mother    Arthritis Father    COPD Father    Heart disease Father    Diabetes Paternal Aunt        paternal Aunts   Heart disease Paternal Aunt    Thyroid disease Neg Hx     Review of Systems  Constitutional:  Positive for chills (rare). Negative for fever.  Respiratory:  Negative for cough, shortness of breath and wheezing.   Cardiovascular:  Positive for leg swelling. Negative for chest pain and palpitations.  Gastrointestinal:  Negative for abdominal pain, constipation, diarrhea and nausea.  Musculoskeletal:  Negative for back pain.       Leg pain  Skin:  Positive for rash.  Neurological:  Negative for light-headedness and headaches.      Objective:   Vitals:   03/01/21 1413  BP: 140/80  Pulse: 84  Temp: 98.2 F (36.8 C)  SpO2: 98%   BP Readings from Last 3 Encounters:  03/01/21 140/80  01/10/21 (!) 141/71  12/15/20 140/84   Wt Readings from Last 3 Encounters:  01/10/21 92 lb (41.7 kg)  11/22/20 103 lb 2.8 oz (46.8 kg)  11/18/20 89 lb (40.4 kg)   Body mass index is 15.31 kg/m.   Physical Exam Constitutional:      General: She is not in acute distress.    Appearance: Normal appearance. She is not ill-appearing.  HENT:     Head: Normocephalic and atraumatic.  Eyes:     Conjunctiva/sclera: Conjunctivae normal.  Cardiovascular:     Rate and Rhythm: Normal rate and regular rhythm.  Pulmonary:     Effort: Pulmonary effort is normal.     Breath sounds: Normal breath sounds.  Musculoskeletal:        General: Tenderness (Quadricep muscles tender to palpation) present.     Right lower leg: No edema.     Left lower leg: No edema.  Skin:    General: Skin is warm and dry.     Findings:  Rash (Semicircular areas of erythema that is slightly raised on arms, legs 1 starting to form near left eyebrow) present.  Neurological:     Mental Status: She is alert and oriented to person, place, and time.  Psychiatric:        Mood and Affect: Mood normal.           Assessment & Plan:    See Problem List for Assessment and Plan of chronic medical problems.    This visit occurred during the SARS-CoV-2 public health emergency.  Safety protocols were in place, including screening questions prior to the visit, additional usage of staff PPE, and extensive cleaning of exam room while observing appropriate contact time as indicated for disinfecting solutions.

## 2021-03-01 ENCOUNTER — Encounter: Payer: Self-pay | Admitting: Internal Medicine

## 2021-03-01 ENCOUNTER — Ambulatory Visit (INDEPENDENT_AMBULATORY_CARE_PROVIDER_SITE_OTHER): Payer: Medicare Other | Admitting: Internal Medicine

## 2021-03-01 ENCOUNTER — Other Ambulatory Visit: Payer: Self-pay

## 2021-03-01 VITALS — BP 140/80 | HR 84 | Temp 98.2°F | Ht 65.0 in

## 2021-03-01 DIAGNOSIS — M329 Systemic lupus erythematosus, unspecified: Secondary | ICD-10-CM

## 2021-03-01 LAB — CBC WITH DIFFERENTIAL/PLATELET
Basophils Absolute: 0.1 10*3/uL (ref 0.0–0.1)
Basophils Relative: 1.2 % (ref 0.0–3.0)
Eosinophils Absolute: 0.1 10*3/uL (ref 0.0–0.7)
Eosinophils Relative: 2.8 % (ref 0.0–5.0)
HCT: 36.9 % (ref 36.0–46.0)
Hemoglobin: 11.6 g/dL — ABNORMAL LOW (ref 12.0–15.0)
Lymphocytes Relative: 36 % (ref 12.0–46.0)
Lymphs Abs: 1.7 10*3/uL (ref 0.7–4.0)
MCHC: 31.4 g/dL (ref 30.0–36.0)
MCV: 65.9 fl — ABNORMAL LOW (ref 78.0–100.0)
Monocytes Absolute: 0.4 10*3/uL (ref 0.1–1.0)
Monocytes Relative: 8.2 % (ref 3.0–12.0)
Neutro Abs: 2.4 10*3/uL (ref 1.4–7.7)
Neutrophils Relative %: 51.8 % (ref 43.0–77.0)
Platelets: 423 10*3/uL — ABNORMAL HIGH (ref 150.0–400.0)
RBC: 5.61 Mil/uL — ABNORMAL HIGH (ref 3.87–5.11)
RDW: 21.6 % — ABNORMAL HIGH (ref 11.5–15.5)
WBC: 4.6 10*3/uL (ref 4.0–10.5)

## 2021-03-01 LAB — COMPREHENSIVE METABOLIC PANEL
ALT: 8 U/L (ref 0–35)
AST: 15 U/L (ref 0–37)
Albumin: 3.7 g/dL (ref 3.5–5.2)
Alkaline Phosphatase: 99 U/L (ref 39–117)
BUN: 8 mg/dL (ref 6–23)
CO2: 27 mEq/L (ref 19–32)
Calcium: 9.9 mg/dL (ref 8.4–10.5)
Chloride: 100 mEq/L (ref 96–112)
Creatinine, Ser: 0.62 mg/dL (ref 0.40–1.20)
GFR: 92.35 mL/min (ref >60.00)
Glucose, Bld: 97 mg/dL (ref 70–99)
Potassium: 4.1 mEq/L (ref 3.5–5.1)
Sodium: 133 mEq/L — ABNORMAL LOW (ref 135–145)
Total Bilirubin: 0.4 mg/dL (ref 0.2–1.2)
Total Protein: 8.5 g/dL — ABNORMAL HIGH (ref 6.0–8.3)

## 2021-03-01 LAB — SEDIMENTATION RATE: Sed Rate: 117 mm/hr — ABNORMAL HIGH (ref 0–30)

## 2021-03-01 LAB — C-REACTIVE PROTEIN: CRP: <1 mg/dL (ref 0.5–20.0)

## 2021-03-01 MED ORDER — PREDNISONE 10 MG PO TABS: ORAL_TABLET | ORAL | 0 refills | Status: DC

## 2021-03-01 NOTE — Assessment & Plan Note (Addendum)
Acute flare of SLE Symptoms started about 1 month ago Has rash consistent with previous flare Having bilateral leg pain and some swelling, which she also had with a previous flare.  Leg pain also suggestive of possible spinal stenosis given bilateral leg pain with laying primarily-hopefully steroids will help, if they do not may need further imaging of the lower back Has not seen rheumatology in a while Check CBC, CMP, ANA, ESR, CRP Prednisone taper 30 mg daily x3 days, 20 mg daily x3 days, 10 mg daily x3 days Call if no improvement

## 2021-03-03 ENCOUNTER — Telehealth: Payer: Self-pay

## 2021-03-03 NOTE — Telephone Encounter (Signed)
Accessible [employer] form emailed today and [employer] [place] forms faxed. Conformation received and patient contacted. Copies made and original paperwork mailed to her.

## 2021-03-04 LAB — ANA: Anti Nuclear Antibody (ANA): POSITIVE — AB

## 2021-03-04 LAB — ANTI-NUCLEAR AB-TITER (ANA TITER): ANA Titer 1: 1:1280 {titer} — ABNORMAL HIGH

## 2021-03-07 ENCOUNTER — Telehealth: Payer: Self-pay | Admitting: Internal Medicine

## 2021-03-07 NOTE — Telephone Encounter (Signed)
    Please call patient to discuss lab results 

## 2021-03-09 ENCOUNTER — Other Ambulatory Visit: Payer: Self-pay | Admitting: Cardiology

## 2021-03-09 DIAGNOSIS — I10 Essential (primary) hypertension: Secondary | ICD-10-CM

## 2021-03-10 ENCOUNTER — Encounter: Payer: Self-pay | Admitting: *Deleted

## 2021-03-10 DIAGNOSIS — Z006 Encounter for examination for normal comparison and control in clinical research program: Secondary | ICD-10-CM

## 2021-03-10 NOTE — Research (Signed)
I called Mercedes [name] and discussed the closing of the [employer] Leadless PM study. I told her hat she would still be followed up with Dr. Johney Frame at the [facility] office at regular intervals, but she will not be followed by Research anymore.  Her future appointments will be arranged by Dr. Jenel Lucks appointment staff according to his guidelines. All questions were asked and answered. Patient voiced understanding and agreement of this plan and  I thanked her for her participation in this study.

## 2021-03-18 ENCOUNTER — Other Ambulatory Visit: Payer: Self-pay

## 2021-03-18 NOTE — Patient Outreach (Signed)
Aging Gracefully Program  03/18/2021  Mercedes [name] Jul 08, 1956 [id number]   [facility] Evaluation Interviewer made contact with patient. Aging Gracefully 5 month survey completed.   Interviewer will send referral to Rod Holler at [facility] for follow up.  [facility] Care Management Assistant  [phone]

## 2021-03-18 NOTE — Telephone Encounter (Signed)
This encounter was created in error - please disregard.

## 2021-03-23 ENCOUNTER — Telehealth: Payer: Self-pay

## 2021-03-23 NOTE — Chronic Care Management (AMB) (Signed)
Chronic Care Management Pharmacy Assistant   Name: Mercedes [name]  MRN: [id number] DOB: Nov 07, 1955  Mercedes [name] is an 65 y.o. year old female who presents for hER initial CCM visit with the clinical pharmacist.  Reason for Encounter: Initial CCM Visit  Recent office visits:  03/01/21- Cheryll Cockayne, MD- seen for acute flare of systemic lupus erythematous short course prednisone 10 mg as directed x 9 days, labs ordered, no follow up documented  12/15/20- Cheryll Cockayne, MD- seen for S/P above knee amputation, referral to home health for PT/OT, no medication changes, follow up 1 yr  10/22/20- Cheryll Cockayne, MD-  seen for type 2 diabetes mellitus, no medication changes, follow up 1 yr   Recent consult visits:  01/10/21- Emilie Rutter, PA-C (Vascular Surgery)- seen  for postoperative follow-up for right above-the-knee amputation, referred to [facility] clinic for evaluation for prosthetic leg [place], no medication changes, no follow up documented  11/18/20- Fabienne Bruns, MD (Vascular Surgery)- seen for back gip lower extremity pain of unknown etiology, admitted to [facility] for evaluation of right leg bypass, no medication changes, no follow up documented 03/032/22- Emilie Rutter, PA-C (Vascular Surgery)- seen for right lower extremity pain, no medication changes, follow up with arteriogram next day  10/01/20- Maeola Harman, MD ( Vascular Surgery)- seen for follow up of PAD, no medication changes, follow up 9 months   Hospital visits:  Medication Reconciliation was completed by comparing discharge summary, patient's EMR and Pharmacy list, and upon discussion with patient.  Admitted to the hospital on 11/18/20 due to back pain. Discharge date was 11/26/20. Discharged from [facility].    All medications remain the same after Hospital Discharge  2. Admitted to the hospital on 10/29/20 due to ischemia of extremity. Discharge date was 11/08/20. Discharged  from [facility].    New?Medications Started at [facility] Discharge:?? aspirin ferrous sulfate gabapentin  multivitamin with minerals oxyCODONE-acetaminophen   All other medications  remain the same after Hospital Discharge?   Medications: Outpatient Encounter Medications as of 03/23/2021  Medication Sig Note   acetaminophen (TYLENOL) 500 MG tablet Take 500-1,000 mg by mouth every 8 (eight) hours as needed for mild pain or headache.    aspirin EC 81 MG EC tablet Take 1 tablet (81 mg total) by mouth daily at 6 (six) AM. Swallow whole.    ELIQUIS 5 MG TABS tablet TAKE 1 TABLET BY MOUTH TWICE DAILY (Patient taking differently: Take 5 mg by mouth 2 (two) times daily.) 11/18/2020: LF #180/90 days on 07/12/2020   Ensure Max Protein (ENSURE MAX PROTEIN) LIQD Take 8 oz by mouth daily.    gabapentin (NEURONTIN) 300 MG capsule Take 1 capsule (300 mg total) by mouth 2 (two) times daily. 11/18/2020: LF #60/30 days on 11/08/2020   metoprolol tartrate (LOPRESSOR) 25 MG tablet TAKE 1/2 TABLET BY MOUTH TWICE DAILY.    Multiple Vitamin (MULTIVITAMIN WITH MINERALS) TABS tablet Take 1 tablet by mouth daily.    nitroGLYCERIN (NITROSTAT) 0.4 MG SL tablet Place 0.4 mg under the tongue every 4 (four) hours as needed for chest pain. 11/18/2020: LF #25 on 06/25/2020   predniSONE (DELTASONE) 10 MG tablet 3 tabs po qd x 3 days, then 2 tabs po qd x 3 days, then 1 tab po qd x 3 days    rosuvastatin (CRESTOR) 40 MG tablet Take 40 mg by mouth at bedtime. 11/18/2020: LF #90/90 days on 08/15/2020   No facility-administered encounter medications on file  as of 03/23/2021.    Current Documented Medications  acetaminophen 500 MG tablet aspirin EC 81 MG EC tablet ELIQUIS 5 MG TABS - 90 DS last filled 03/10/21 Ensure Max Protein LIQD gabapentin 300 MG- 30 DS last filled 12/02/20 metoprolol tartrate  25 MG -90 DS last filled 03/09/21 Multiple Vitamin  TABS tablet nitroGLYCERIN  0.4 MG SL- 4 DS last filled  11/26/20 predniSONE 10 MG - 9 DS last filled 03/01/21 rosuvastatin  40 MG-90 DS last filled 03/09/21  Purvis Kilts CPA, CMA

## 2021-03-24 ENCOUNTER — Other Ambulatory Visit: Payer: Self-pay

## 2021-03-24 ENCOUNTER — Ambulatory Visit (INDEPENDENT_AMBULATORY_CARE_PROVIDER_SITE_OTHER): Payer: Medicare Other

## 2021-03-24 DIAGNOSIS — I480 Paroxysmal atrial fibrillation: Secondary | ICD-10-CM

## 2021-03-24 DIAGNOSIS — E785 Hyperlipidemia, unspecified: Secondary | ICD-10-CM | POA: Diagnosis not present

## 2021-03-24 DIAGNOSIS — I10 Essential (primary) hypertension: Secondary | ICD-10-CM

## 2021-03-24 NOTE — Progress Notes (Signed)
Chronic Care Management Pharmacy Note  03/24/2021 Name:  Mercedes [name] MRN:  [id number] DOB:  November 03, 1955  Summary: - Patient reports that she feels she is doing well, appointment was brief as patient reports that she had other obligations today, patient had no issues or concerns about current medications  Recommendations/Changes made from today's visit: -No changes to medications at this time, patient to reach out with any issues or concerns   Subjective: Mercedes [name] is an 65 y.o. year old female who is a primary patient of Burns, Claudina Lick, MD.  The CCM team was consulted for assistance with disease management and care coordination needs.    Engaged with patient by telephone for initial visit in response to provider referral for pharmacy case management and/or care coordination services.   Consent to Services:  The patient was given the following information about Chronic Care Management services today, agreed to services, and gave verbal consent: 1. CCM service includes personalized support from designated clinical staff supervised by the primary care provider, including individualized plan of care and coordination with other care providers 2. 24/7 contact phone numbers for assistance for urgent and routine care needs. 3. Service will only be billed when office clinical staff spend 20 minutes or more in a month to coordinate care. 4. Only one practitioner may furnish and bill the service in a calendar month. 5.The patient may stop CCM services at any time (effective at the end of the month) by phone call to the office staff. 6. The patient will be responsible for cost sharing (co-pay) of up to 20% of the service fee (after annual deductible is met). Patient agreed to services and consent obtained.  Patient Care Team: Binnie Rail, MD as PCP - General (Internal Medicine) Stanford Breed Denice Bors, MD as PCP - Cardiology (Cardiology) Thompson Grayer, MD as PCP - Electrophysiology  (Cardiology) Stanford Breed Denice Bors, MD (Cardiology) Elam Dutch, MD (Vascular Surgery) Randon Goldsmith, OT as Occupational Therapist (Occupational Therapy) Tomasa Blase, [facility] as Pharmacist (Pharmacist)  Recent office visits:  03/01/21- Billey Gosling, MD- seen for acute flare of systemic lupus erythematous short course prednisone 10 mg as directed x 9 days, labs ordered, no follow up documented 12/15/20- Billey Gosling, MD- seen for S/P above knee amputation, referral to home health for PT/OT, no medication changes, follow up 1 yr 10/22/20- Billey Gosling, MD-  seen for type 2 diabetes mellitus, no medication changes, follow up 1 yr   Recent consult visits:  01/10/21- Dagoberto Ligas, PA-C (Vascular Surgery)- seen  for postoperative follow-up for right above-the-knee amputation, referred to [facility] clinic for evaluation for prosthetic leg [place], no medication changes, no follow up documented 11/18/20- Ruta Hinds, MD (Vascular Surgery)- seen for back hip lower extremity pain of unknown etiology, admitted to [facility] for evaluation of right leg bypass, no medication changes, no follow up documented 10/28/20- Dagoberto Ligas, PA-C (Vascular Surgery)- seen for right lower extremity pain, no medication changes, follow up with arteriogram next day 10/01/20- Waynetta Sandy, MD ( Vascular Surgery)- seen for follow up of PAD, no medication changes, follow up 9 months    Hospital visits:  Medication Reconciliation was completed by comparing discharge summary, patient's EMR and Pharmacy list, and upon discussion with patient.   Admitted to the hospital on 11/18/20 due to ischemic rest pain of right lower extremity - 11/23/2020 - consented to right above-the-knee amputation - . Discharge date was 11/26/20. Discharged from [facility] to skilled  nursing facility     All medications remain the same after Hospital Discharge   2. Admitted to the hospital on 10/29/20 due  to ischemia of extremity. Discharge date was 11/08/20. Discharged from [place]?Medications Started at [facility] Discharge:?? aspirin ferrous sulfate gabapentin multivitamin with minerals oxyCODONE-acetaminophen    All other medications  remain the same after Hospital Discharge?  Objective:  Lab Results  Component Value Date   CREATININE 0.62 03/01/2021   BUN 8 03/01/2021   GFR 92.35 03/01/2021   GFRNONAA >60 11/25/2020   GFRAA >60 11/24/2019   NA 133 (L) 03/01/2021   K 4.1 03/01/2021   CALCIUM 9.9 03/01/2021   CO2 27 03/01/2021   GLUCOSE 97 03/01/2021    Lab Results  Component Value Date/Time   HGBA1C 5.7 (A) 10/22/2020 12:00 PM   HGBA1C 5.7 10/22/2020 12:00 PM   HGBA1C 5.7 10/22/2020 12:00 PM   HGBA1C 5.7 10/22/2020 12:00 PM   HGBA1C 6.1 (A) 10/22/2019 01:28 PM   HGBA1C 6.4 11/05/2018 10:46 AM   HGBA1C 6.5 03/29/2017 11:43 AM   GFR 92.35 03/01/2021 02:59 PM   GFR 60.95 01/31/2019 03:08 PM    Last diabetic Eye exam:  Lab Results  Component Value Date/Time   HMDIABEYEEXA Normal 07/29/2011 12:00 AM    Last diabetic Foot exam:  No results found for: HMDIABFOOTEX   Lab Results  Component Value Date   CHOL 113 11/02/2020   HDL 40 (L) 11/02/2020   LDLCALC 61 11/02/2020   TRIG 58 11/02/2020   CHOLHDL 2.8 11/02/2020    Hepatic Function Latest Ref Rng & Units 03/01/2021 09/01/2020 11/18/2019  Total Protein 6.0 - 8.3 g/dL 8.5(H) 6.7 7.4  Albumin 3.5 - 5.2 g/dL 3.7 2.4(L) 3.0(L)  AST 0 - 37 U/L _0 ALT 0 - 35 U/L _1 Alk Phosphatase 39 - 117 U/L 99 74 76  Total Bilirubin 0.2 - 1.2 mg/dL 0.4 0.5 0.5  Bilirubin, Direct 0.0 - 0.3 mg/dL - - -    Lab Results  Component Value Date/Time   TSH 0.75 01/31/2019 03:08 PM   TSH 0.593 03/28/2018 12:00 AM   FREET4 1.22 01/31/2019 03:08 PM   FREET4 1.02 03/29/2017 11:43 AM    CBC Latest Ref Rng & Units 03/01/2021 11/26/2020 11/25/2020  WBC 4.0 - 10.5 K/uL 4.6 5.8 6.5  Hemoglobin 12.0 -  15.0 g/dL 11.6(L) 7.7(L) 7.4(L)  Hematocrit 36.0 - 46.0 % 36.9 25.8(L) 24.8(L)  Platelets 150.0 - 400.0 K/uL 423.0(H) 394 342    No results found for: VD25OH  Clinical ASCVD: Yes  The ASCVD Risk score Mikey Bussing DC Jr., et al., 2013) failed to calculate for the following reasons:   The patient has a prior MI or stroke diagnosis    Depression screen [facility] 2/9 03/18/2021 07/29/2020 06/30/2020  Decreased Interest 1 0 0  Down, Depressed, Hopeless 1 0 1  PHQ - 2 Score 2 0 1  Altered sleeping 1 - -  Tired, decreased energy 1 - -  Change in appetite 1 - -  Feeling bad or failure about yourself  0 - -  Trouble concentrating 0 - -  Moving slowly or fidgety/restless 0 - -  Suicidal thoughts 0 - -  PHQ-9 Score 5 - -  Difficult doing work/chores Not difficult at all - -  Some recent data might be hidden    Social History   Tobacco Use  Smoking Status Former   Types: Cigarettes  Quit date: 2019   Years since quitting: 3.5  Smokeless Tobacco Never  Tobacco Comments       BP Readings from Last 3 Encounters:  03/01/21 140/80  01/10/21 (!) 141/71  12/15/20 140/84   Pulse Readings from Last 3 Encounters:  03/01/21 84  01/10/21 (!) 51  12/15/20 94   Wt Readings from Last 3 Encounters:  01/10/21 92 lb (41.7 kg)  11/22/20 103 lb 2.8 oz (46.8 kg)  11/18/20 89 lb (40.4 kg)   BMI Readings from Last 3 Encounters:  03/01/21 15.31 kg/m  01/10/21 15.31 kg/m  11/22/20 17.17 kg/m    Assessment/Interventions: Review of patient past medical history, allergies, medications, health status, including review of consultants reports, laboratory and other test data, was performed as part of comprehensive evaluation and provision of chronic care management services.   SDOH:  (Social Determinants of Health) assessments and interventions performed: Yes  SDOH Screenings   Alcohol Screen: Low Risk    Last Alcohol Screening Score (AUDIT): 0  Depression (PHQ2-9): Medium Risk   PHQ-2 Score: 5   Financial Resource Strain: Low Risk    Difficulty of Paying Living Expenses: Not hard at all  Food Insecurity: No Food Insecurity   Worried About Charity fundraiser in the Last Year: Never true   Ran Out of Food in the Last Year: Never true  Housing: Low Risk    Last Housing Risk Score: 0  Physical Activity: Inactive   Days of Exercise per Week: 0 days   Minutes of Exercise per Session: 0 min  Social Connections: Unknown   Frequency of Communication with Friends and Family: More than three times a week   Frequency of Social Gatherings with Friends and Family: Once a week   Attends Religious Services: Patient refused   Marine scientist or Organizations: Patient refused   Attends Music therapist: Patient refused   Marital Status: Never married  Stress: No Stress Concern Present   Feeling of Stress : Not at all  Tobacco Use: Medium Risk   Smoking Tobacco Use: Former   Smokeless Tobacco Use: Never  Transportation Needs: No Data processing manager (Medical): No   Lack of Transportation (Non-Medical): No    CCM Care Plan  Allergies  Allergen Reactions   Brilinta [Ticagrelor] Anaphylaxis, Shortness Of Breath and Other (See Comments)    Also, chest tightness   Latex Rash   Tape Rash   Zetia [Ezetimibe]     fatigue    Medications Reviewed Today     Reviewed by Binnie Rail, MD (Physician) on 03/01/21 at [place] List Status: <None>   Medication Order Taking? Sig Documenting Provider Last Dose Status Informant  acetaminophen (TYLENOL) 500 MG tablet [id number] Yes Take 500-1,000 mg by mouth every 8 (eight) hours as needed for mild pain or headache. [provider] Taking Active   aspirin EC 81 MG EC tablet [id number] Yes Take 1 tablet (81 mg total) by mouth daily at 6 (six) AM. Swallow whole. Dagoberto Ligas, PA-C Taking Active   ELIQUIS 5 MG TABS tablet [id number] Yes TAKE 1 TABLET BY MOUTH TWICE DAILY  Patient taking  differently: Take 5 mg by mouth 2 (two) times daily.   Thompson Grayer, MD Taking Active            Med Note Duffy Bruce, Para [place] Nov 18, 2020 10:24 PM) LF #180/90 days on 07/12/2020  Ensure Max Protein (ENSURE MAX PROTEIN)  LIQD [id number] Yes Take 8 oz by mouth daily. [provider] Taking Active   gabapentin (NEURONTIN) 300 MG capsule [id number] Yes Take 1 capsule (300 mg total) by mouth 2 (two) times daily. Dagoberto Ligas, PA-C Taking Active            Med Note Duffy Bruce, Legrand Como   Thu Nov 18, 2020 10:25 PM) LF #60/30 days on 11/08/2020  metoprolol tartrate (LOPRESSOR) 25 MG tablet [id number] Yes Take 0.5 tablets (12.5 mg total) by mouth 2 (two) times daily.  Patient taking differently: Take by mouth daily.   Lelon Perla, MD Taking Active            Med Note Duffy Bruce, Para [place] Nov 18, 2020 10:26 PM) LF #90/90 days on 07/12/2020  Multiple Vitamin (MULTIVITAMIN WITH MINERALS) TABS tablet [id number] Yes Take 1 tablet by mouth daily. Dagoberto Ligas, PA-C Taking Active   nitroGLYCERIN (NITROSTAT) 0.4 MG SL tablet [id number] Yes Place 0.4 mg under the tongue every 4 (four) hours as needed for chest pain. [provider] Taking Active            Med Note Duffy Bruce, Legrand Como   Thu Nov 18, 2020 10:27 PM) LF #25 on 06/25/2020  predniSONE (DELTASONE) 10 MG tablet [id number] Yes 3 tabs po qd x 3 days, then 2 tabs po qd x 3 days, then 1 tab po qd x 3 days Binnie Rail, MD  Active   rosuvastatin (CRESTOR) 40 MG tablet [id number] Yes Take 40 mg by mouth at bedtime. [provider] Taking Active            Med Note Duffy Bruce, Para Skeans Nov 18, 2020 10:27 PM) LF #90/90 days on 08/15/2020            Patient Active Problem List   Diagnosis Date Noted   S/P AKA (above knee amputation), right ([place]), 11/23/20 12/14/2020   Protein-calorie malnutrition, severe 11/21/2020   Malnutrition of moderate degree 11/04/2020   Ischemia of extremity 10/29/2020   Depression 12/15/2019   S/P  AKA (above knee amputation), left ([place]), 10/01/19 10/20/2019   Femoral-popliteal bypass graft occlusion, left ([place]) 09/25/2019   Peripheral arterial disease ([place]) 09/25/2019   Critical lower limb ischemia ([place]) 05/09/2019   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   Tachycardia-bradycardia ([place]) 03/09/2010   PVD- s/p [facility] April 2015 10/29/2009   Dyslipidemia 08/26/2009   Essential hypertension 08/26/2009   CORONARY ATHEROSCLEROSIS NATIVE CORONARY ARTERY 08/26/2009   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   Rheumatoid arthritis ([place]) 08/26/2009    Immunization History  Administered Date(s) Administered   Influenza,inj,Quad PF,6+ Mos 07/04/2013, 07/21/2013   Influenza-Unspecified 06/25/2013   Td 08/29/2004   Tdap 08/28/2013  Conditions to be addressed/monitored:  Hypertension, Hyperlipidemia, Atrial Fibrillation, Coronary Artery Disease, and Chronic pain   Care Plan : CCM Care Plan  Updates made by Tomasa Blase, RPH since 03/24/2021 12:00 AM     Problem: HLD, CAD, Afib      Long-Range Goal: Disease Management   Start Date: 03/24/2021  Expected End Date: 09/24/2021  This Visit's Progress: On track  Priority: High  Note:   Current  Barriers:  Unable to independently monitor therapeutic efficacy  Pharmacist Clinical Goal(s):  Patient will achieve adherence to monitoring guidelines and medication adherence to achieve therapeutic efficacy through collaboration with PharmD and provider.   Interventions: 1:1 collaboration with Binnie Rail, MD regarding development and update of comprehensive plan of care as evidenced by provider attestation and co-signature Inter-disciplinary care team collaboration (see longitudinal plan of care) Comprehensive medication review performed; medication list updated in electronic medical record  Hyperlipidemia/ CAD/ History of MI: (LDL goal < 70) -Controlled -Last LDL 60m/dL (11/02/2020) -Current treatment: Rosuvastatin 411m- 1 tablet daily  Nitroglycerin 0.68m70mL tablet - 1 tablet every 5 minutes as needed  -Medications previously tried: pravastatin, ezetimibe, atorvastatin   -Current dietary patterns: unable to assess at appointment today due to patient's schedule constraints -Current exercise habits: unable to assess at appointment today due to patient's schedule constraints -Educated on Cholesterol goals;  Benefits of statin for ASCVD risk reduction; Importance of limiting foods high in cholesterol; -Counseled on diet and exercise extensively Recommended to continue current medication  Atrial Fibrillation (Goal: prevent stroke and major bleeding)/ Hypertension (BP goal <140/90) -Controlled -CHADSVASC: 6 -Current treatment: Rate control: Metoprolol Tartrate 74m30m1/2 tablet twice daily  Anticoagulation: Eliquis 5mg 59m tablet twice daily -Medications previously tried: amiodarone, carvedilol, diltiazem, lisinopril, olmesartan, hctz,  -Home BP and HR readings: patient did not wish to discuss today, most recent office visit readings were 140/80-84, 141/71-51, 140/84-94  -Counseled on increased risk of stroke due to Afib and benefits of anticoagulation for stroke  prevention; importance of adherence to anticoagulant exactly as prescribed; bleeding risk associated with Eliquis and importance of self-monitoring for signs/symptoms of bleeding; -Recommended to continue current medication  Health Maintenance -Vaccine gaps: Shingles and COVID vaccine -Current therapy:  Acetaminophen 500mg 97m2 tablets every 8 hours as needed  -Educated on Cost vs benefit of each product must be carefully weighed by individual consumer -Patient is satisfied with current therapy and denies issues -Recommended to continue current medication  Patient Goals/Self-Care Activities Patient will:  - take medications as prescribed  Follow Up Plan: Telephone follow up appointment with care management team member scheduled for: The patient has been provided with contact information for the care management team and has been advised to call with any health related questions or concerns.        Medication Assistance: None required.  Patient affirms current coverage meets needs.  [id number] [place]: [phone] [address]: [phone] pill box? Unable to assess at appointment Pt endorses 95-100% compliance  Care Plan and Follow Up Patient Decision:  Patient agrees to Care Plan and Follow-up.  Plan: Telephone follow up appointment with  care management team member scheduled for:  6 months and The patient has been provided with contact information for the care management team and has been advised to call with any health related questions or concerns.   Tomasa Blase, PharmD Clinical Pharmacist, [place]

## 2021-03-24 NOTE — Patient Instructions (Signed)
Visit Information   PATIENT GOALS:   Goals Addressed             This Visit's Progress    Manage My Medicine       Timeframe:  Long-Range Goal Priority:  High Start Date:  03/24/2021                           Expected End Date:  09/24/2021                     Follow Up Date 09/24/2021   - call for medicine refill 2 or 3 days before it runs out - keep a list of all the medicines I take; vitamins and herbals too - learn to read medicine labels - use a pillbox to sort medicine - use an alarm clock or phone to remind me to take my medicine    Why is this important?   These steps will help you keep on track with your medicines.          Consent to CCM Services: Ms. Pratte was given information about Chronic Care Management services today including:  CCM service includes personalized support from designated clinical staff supervised by her physician, including individualized plan of care and coordination with other care providers 24/7 contact phone numbers for assistance for urgent and routine care needs. Service will only be billed when office clinical staff spend 20 minutes or more in a month to coordinate care. Only one practitioner may furnish and bill the service in a calendar month. The patient may stop CCM services at any time (effective at the end of the month) by phone call to the office staff. The patient will be responsible for cost sharing (co-pay) of up to 20% of the service fee (after annual deductible is met).  Patient agreed to services and verbal consent obtained.   Patient verbalizes understanding of instructions provided today and agrees to view in MyChart.   Telephone follow up appointment with care management team member scheduled for: 6 months The patient has been provided with contact information for the care management team and has been advised to call with any health related questions or concerns.   Daniel C Szabat, PharmD Clinical Pharmacist,  [place]   CLINICAL CARE PLAN: Patient Care Plan: CCM Care Plan     Problem Identified: HLD, CAD, Afib      Long-Range Goal: Disease Management   Start Date: 03/24/2021  Expected End Date: 09/24/2021  This Visit's Progress: On track  Priority: High  Note:   Current Barriers:  Unable to independently monitor therapeutic efficacy  Pharmacist Clinical Goal(s):  Patient will achieve adherence to monitoring guidelines and medication adherence to achieve therapeutic efficacy through collaboration with PharmD and provider.   Interventions: 1:1 collaboration with Burns, Stacy J, MD regarding development and update of comprehensive plan of care as evidenced by provider attestation and co-signature Inter-disciplinary care team collaboration (see longitudinal plan of care) Comprehensive medication review performed; medication list updated in electronic medical record  Hyperlipidemia/ CAD/ History of MI: (LDL goal < 70) -Controlled -Last LDL 61mg/dL (11/02/2020) -Current treatment: Rosuvastatin 40mg - 1 tablet daily  Nitroglycerin 0.4mg SL tablet - 1 tablet every 5 minutes as needed  -Medications previously tried: pravastatin, ezetimibe, atorvastatin   -Current dietary patterns: unable to assess at appointment today due to patient's schedule constraints -Current exercise habits: unable to assess at appointment today due to patient's schedule   constraints -Educated on Cholesterol goals;  Benefits of statin for ASCVD risk reduction; Importance of limiting foods high in cholesterol; -Counseled on diet and exercise extensively Recommended to continue current medication  Atrial Fibrillation (Goal: prevent stroke and major bleeding)/ Hypertension (BP goal <140/90) -Controlled -CHADSVASC: 6 -Current treatment: Rate control: Metoprolol Tartrate 20m - 1/2 tablet twice daily  Anticoagulation: Eliquis 534m- 1 tablet twice daily -Medications previously tried: amiodarone, carvedilol,  diltiazem, lisinopril, olmesartan, hctz,  -Home BP and HR readings: patient did not wish to discuss today, most recent office visit readings were 140/80-84, 141/71-51, 140/84-94  -Counseled on increased risk of stroke due to Afib and benefits of anticoagulation for stroke prevention; importance of adherence to anticoagulant exactly as prescribed; bleeding risk associated with Eliquis and importance of self-monitoring for signs/symptoms of bleeding; -Recommended to continue current medication  Health Maintenance -Vaccine gaps: Shingles and COVID vaccine -Current therapy:  Acetaminophen 50038m 1-2 tablets every 8 hours as needed  -Educated on Cost vs benefit of each product must be carefully weighed by individual consumer -Patient is satisfied with current therapy and denies issues -Recommended to continue current medication  Patient Goals/Self-Care Activities Patient will:  - take medications as prescribed  Follow Up Plan: Telephone follow up appointment with care management team member scheduled for: The patient has been provided with contact information for the care management team and has been advised to call with any health related questions or concerns.

## 2021-03-29 DIAGNOSIS — I739 Peripheral vascular disease, unspecified: Secondary | ICD-10-CM | POA: Diagnosis not present

## 2021-03-29 DIAGNOSIS — Z4781 Encounter for orthopedic aftercare following surgical amputation: Secondary | ICD-10-CM | POA: Diagnosis not present

## 2021-04-01 IMAGING — DX DG HIP (WITH OR WITHOUT PELVIS) 2-3V*R*
3 series · 3 of 3 positions shown · non-contrast
Comparison: None.

CLINICAL DATA: Back pain

EXAM:
DG HIP (WITH OR WITHOUT PELVIS) 2-3V RIGHT

[pelvis ap]
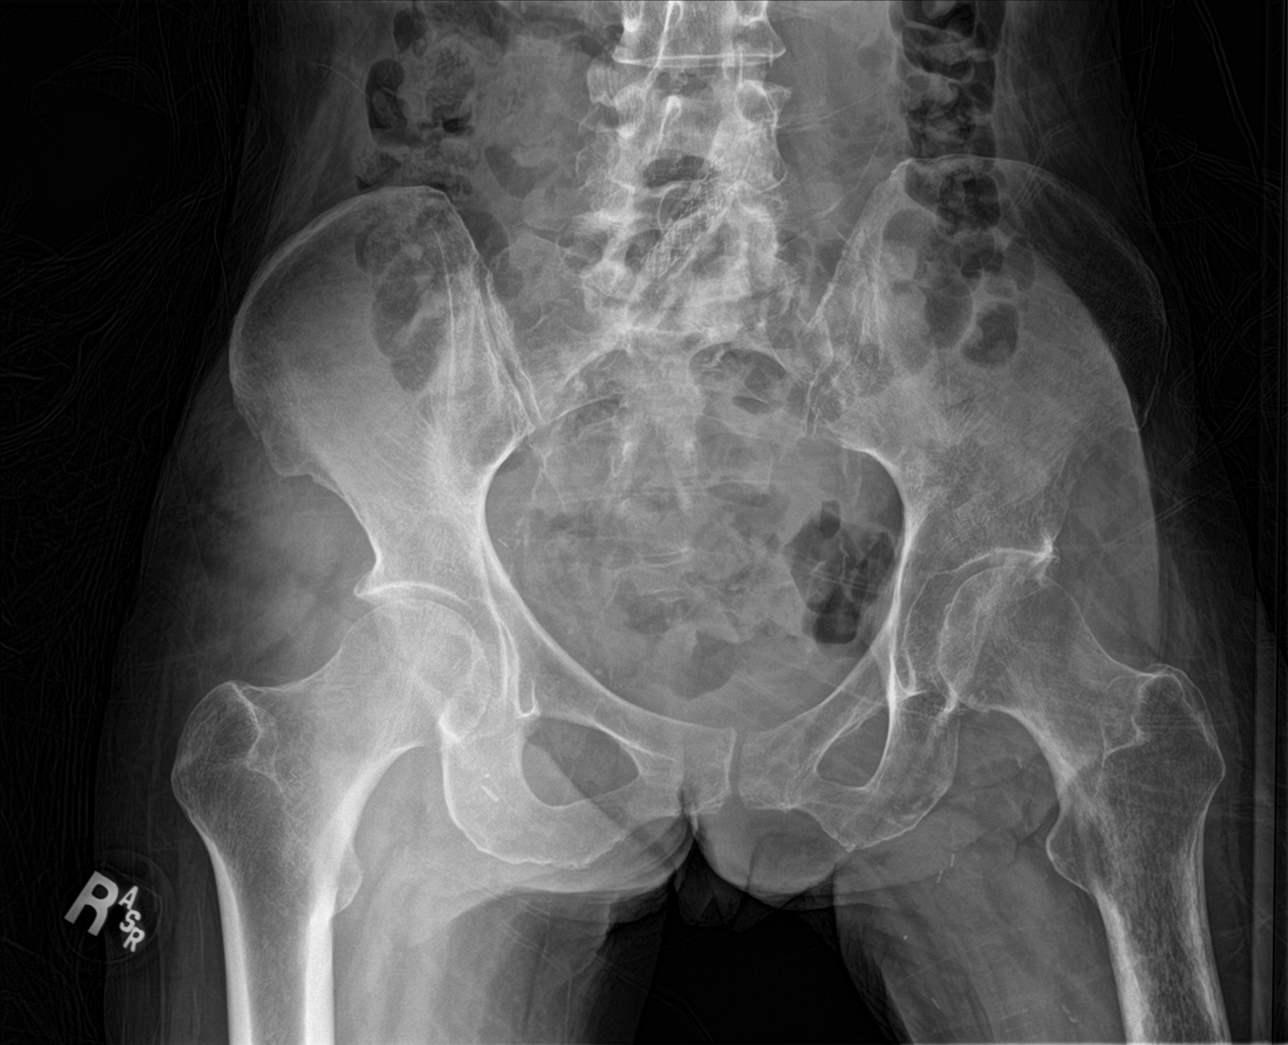

[hip ap]
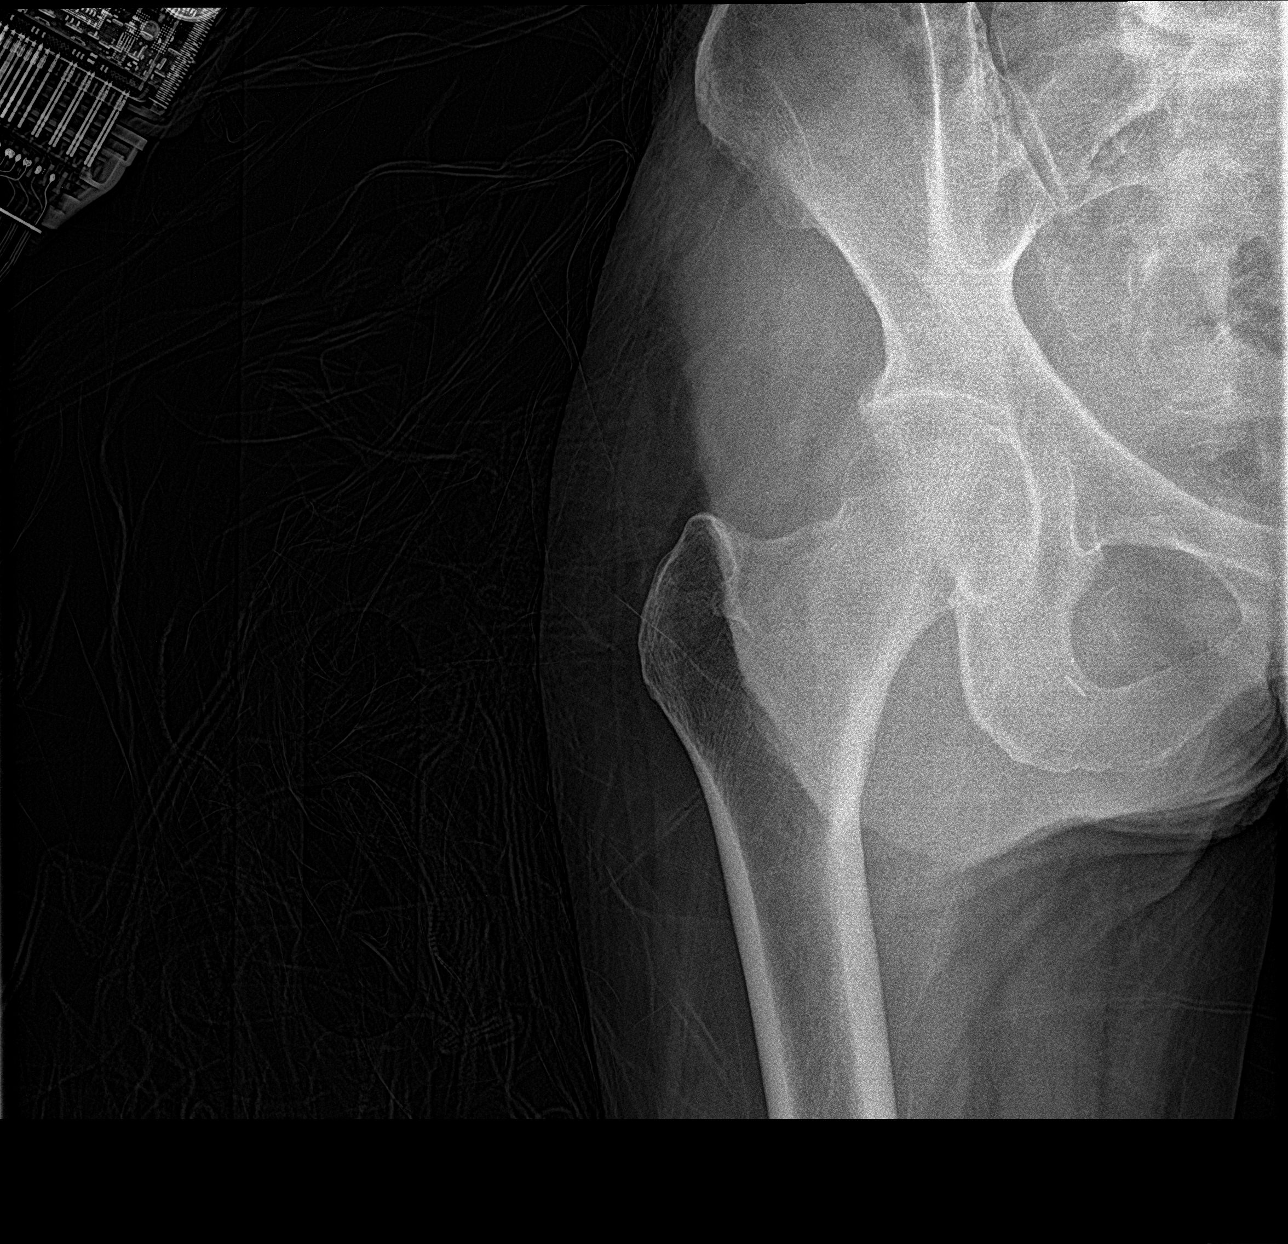

[hip lat]
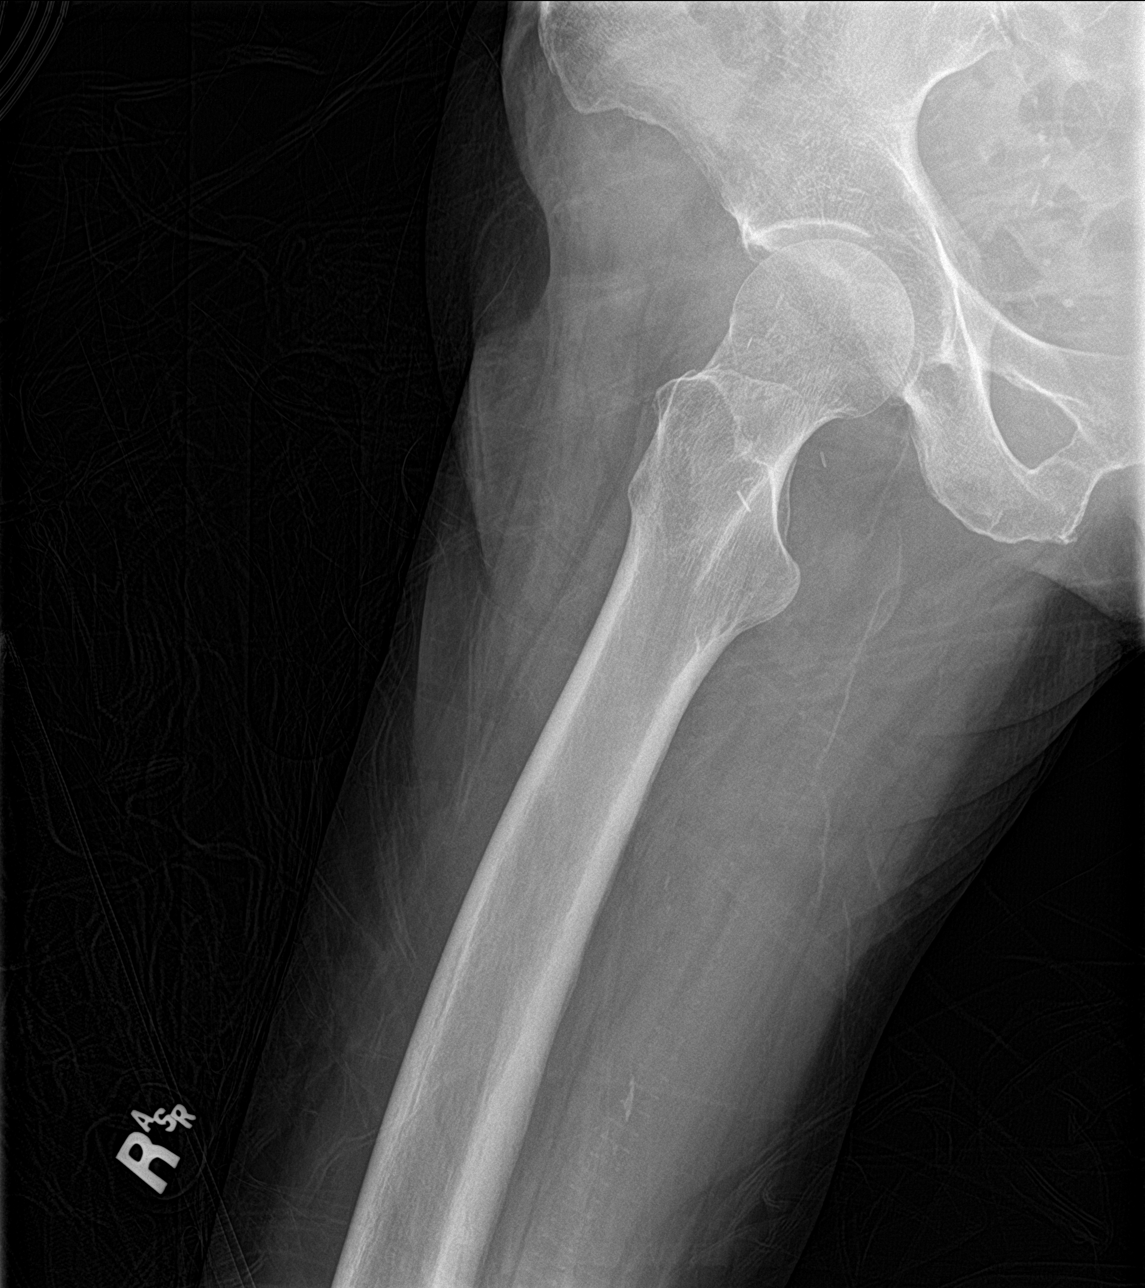

[3 of 3 positions shown; findings below may reference images not displayed]

FINDINGS: Frontal view of the pelvis as well as frontal and frogleg lateral
views of the right hip are obtained. No acute fracture, subluxation,
or dislocation within either hip. The remainder of the bony pelvis
is unremarkable. Degenerative changes at the lumbosacral junction.
Right iliac stent overlies the lower lumbar spine.
IMPRESSION: 1. No acute displaced fracture.

## 2021-04-25 ENCOUNTER — Encounter: Payer: Medicare Other | Admitting: Internal Medicine

## 2021-04-25 DIAGNOSIS — E785 Hyperlipidemia, unspecified: Secondary | ICD-10-CM

## 2021-04-25 DIAGNOSIS — I2510 Atherosclerotic heart disease of native coronary artery without angina pectoris: Secondary | ICD-10-CM

## 2021-04-25 DIAGNOSIS — I739 Peripheral vascular disease, unspecified: Secondary | ICD-10-CM

## 2021-04-25 DIAGNOSIS — R001 Bradycardia, unspecified: Secondary | ICD-10-CM

## 2021-04-25 DIAGNOSIS — I480 Paroxysmal atrial fibrillation: Secondary | ICD-10-CM

## 2021-04-25 DIAGNOSIS — I10 Essential (primary) hypertension: Secondary | ICD-10-CM

## 2021-04-29 DIAGNOSIS — I739 Peripheral vascular disease, unspecified: Secondary | ICD-10-CM | POA: Diagnosis not present

## 2021-04-29 DIAGNOSIS — Z4781 Encounter for orthopedic aftercare following surgical amputation: Secondary | ICD-10-CM | POA: Diagnosis not present

## 2021-05-29 DIAGNOSIS — Z4781 Encounter for orthopedic aftercare following surgical amputation: Secondary | ICD-10-CM | POA: Diagnosis not present

## 2021-05-29 DIAGNOSIS — I739 Peripheral vascular disease, unspecified: Secondary | ICD-10-CM | POA: Diagnosis not present

## 2021-06-03 ENCOUNTER — Telehealth: Payer: Self-pay

## 2021-06-03 NOTE — Progress Notes (Signed)
    Chronic Care Management Pharmacy Assistant   Name: Mercedes [name]  MRN: [id number] DOB: [birth year]   Reason for Encounter: Disease State General assessment     Recent office visits:  None noted   Recent consult visits:  None noted   Hospital visits:  None in previous 6 months  Medications: Outpatient Encounter Medications as of 06/03/2021  Medication Sig Note   acetaminophen (TYLENOL) 500 MG tablet Take 500-1,000 mg by mouth every 8 (eight) hours as needed for mild pain or headache. (Patient not taking: Reported on 03/24/2021)    ELIQUIS 5 MG TABS tablet TAKE 1 TABLET BY MOUTH TWICE DAILY (Patient taking differently: Take 5 mg by mouth 2 (two) times daily.) 11/18/2020: LF #180/90 days on 07/12/2020   metoprolol tartrate (LOPRESSOR) 25 MG tablet TAKE 1/2 TABLET BY MOUTH TWICE DAILY.    nitroGLYCERIN (NITROSTAT) 0.4 MG SL tablet Place 0.4 mg under the tongue every 4 (four) hours as needed for chest pain. (Patient not taking: Reported on 03/24/2021) 11/18/2020: LF #25 on 06/25/2020   rosuvastatin (CRESTOR) 40 MG tablet Take 40 mg by mouth at bedtime. 11/18/2020: LF #90/90 days on 08/15/2020   No facility-administered encounter medications on file as of 06/03/2021.    Care Gaps: COVID-19 Vaccine never done FOOT EXAM Never done URINE MICROALBUMIN Never done Zoster Vaccines- Shingrix Never done COLONOSCOPY Never done MAMMOGRAM Never done DEXA SCAN Never done OPHTHALMOLOGY EXAM Last completed: Jul 29, 2011 HEMOGLOBIN A1C Last completed: Oct 22, 2020   Have you had any problems recently with your health?   Have you had any problems with your pharmacy?   What issues or side effects are you having with your medications?   What would you like me to pass along to Mercedes [name],CPP for them to help you with?    What can we do to take care of you better?  Misc. Comment: Called patient and attempted to do an assessment call with her. Patient did not want to answer any questions  and denies any problems with pharmacy or medications. Patient did not want to schedule a follow up appointment with Mercedes [name], and states that she will call if she wants to make one. Attempted to explain the CCM program to her and she states that she does fine with just her PCP provider. I asked the patient if she would like to remain in the program and she stated that she will call if she needs to.     Star Rating Drugs: Rosuvastatin 40 mg last fill 03/09/2021 90 DS    Mercedes [name], CMA

## 2021-06-16 ENCOUNTER — Telehealth: Payer: Self-pay | Admitting: Internal Medicine

## 2021-06-16 NOTE — Telephone Encounter (Signed)
   SIHAAM CHROBAK DOB: 1955-12-04 MRN: [id number]   RIDER WAIVER AND RELEASE OF LIABILITY  For purposes of improving physical access to our facilities, [facility] is pleased to partner with third parties to provide [facility] patients or other authorized individuals the option of convenient, on-demand ground transportation services (the Chiropractor") through use of the technology service that enables users to request on-demand ground transportation from independent third-party providers.  By opting to use and accept these [employer], I, the undersigned, hereby agree on behalf of myself, and on behalf of any minor child using the Science writer for whom I am the parent or legal guardian, as follows:  Science writer provided to me are provided by independent third-party transportation providers who are not [employer] or employees and who are unaffiliated with [employer]. [facility] is neither a transportation carrier nor a common or public carrier. [facility] has no control over the quality or safety of the transportation that occurs as a result of the [employer]. [facility] cannot guarantee that any third-party transportation provider will complete any arranged transportation service. [facility] makes no representation, warranty, or guarantee regarding the reliability, timeliness, quality, safety, suitability, or availability of any of the Transport Services or that they will be error free. I fully understand that traveling by vehicle involves risks and dangers of serious bodily injury, including permanent disability, paralysis, and death. I agree, on behalf of myself and on behalf of any minor child using the Transport Services for whom I am the parent or legal guardian, that the entire risk arising out of my use of the [employer] remains solely with me, to the maximum extent permitted under applicable law. The [employer] are provided  "as is" and "as available." [facility] disclaims all representations and warranties, express, implied or statutory, not expressly set out in these terms, including the implied warranties of merchantability and fitness for a particular purpose. I hereby waive and release [facility], its agents, employees, officers, directors, representatives, insurers, attorneys, assigns, successors, subsidiaries, and affiliates from any and all past, present, or future claims, demands, liabilities, actions, causes of action, or suits of any kind directly or indirectly arising from acceptance and use of the [employer]. I further waive and release [facility] and its affiliates from all present and future liability and responsibility for any injury or death to persons or damages to property caused by or related to the use of the [employer]. I have read this Waiver and Release of Liability, and I understand the terms used in it and their legal significance. This Waiver is freely and voluntarily given with the understanding that my right (as well as the right of any minor child for whom I am the parent or legal guardian using the [employer]) to legal recourse against [facility] in connection with the [employer] is knowingly surrendered in return for use of these services.   I attest that I read the consent document to [name], gave Ms. Fleeger the opportunity to ask questions and answered the questions asked (if any). I affirm that [name] then provided consent for she's participation in this program.     Ephriam Knuckles Vilsaint

## 2021-06-17 ENCOUNTER — Other Ambulatory Visit: Payer: Self-pay

## 2021-06-20 DIAGNOSIS — I10 Essential (primary) hypertension: Secondary | ICD-10-CM | POA: Diagnosis not present

## 2021-06-20 DIAGNOSIS — Z89611 Acquired absence of right leg above knee: Secondary | ICD-10-CM | POA: Diagnosis not present

## 2021-06-20 DIAGNOSIS — Z89612 Acquired absence of left leg above knee: Secondary | ICD-10-CM | POA: Diagnosis not present

## 2021-06-23 ENCOUNTER — Encounter: Payer: Self-pay | Admitting: Internal Medicine

## 2021-06-29 DIAGNOSIS — Z4781 Encounter for orthopedic aftercare following surgical amputation: Secondary | ICD-10-CM | POA: Diagnosis not present

## 2021-06-29 DIAGNOSIS — I739 Peripheral vascular disease, unspecified: Secondary | ICD-10-CM | POA: Diagnosis not present

## 2021-07-11 ENCOUNTER — Telehealth: Payer: Self-pay

## 2021-07-11 NOTE — Progress Notes (Signed)
    Chronic Care Management Pharmacy Assistant   Name: Mercedes [name]  MRN: [id number] DOB: Sep 09, 1955   Reason for Encounter: Disease State   Conditions to be addressed/monitored: General Adherence   Recent office visits:  None ID  Recent consult visits:  None ID  Hospital visits:  None in previous 6 months  Medications: Outpatient Encounter Medications as of 07/11/2021  Medication Sig Note   acetaminophen (TYLENOL) 500 MG tablet Take 500-1,000 mg by mouth every 8 (eight) hours as needed for mild pain or headache. (Patient not taking: Reported on 03/24/2021)    ELIQUIS 5 MG TABS tablet TAKE 1 TABLET BY MOUTH TWICE DAILY (Patient taking differently: Take 5 mg by mouth 2 (two) times daily.) 11/18/2020: LF #180/90 days on 07/12/2020   metoprolol tartrate (LOPRESSOR) 25 MG tablet TAKE 1/2 TABLET BY MOUTH TWICE DAILY.    nitroGLYCERIN (NITROSTAT) 0.4 MG SL tablet Place 0.4 mg under the tongue every 4 (four) hours as needed for chest pain. (Patient not taking: Reported on 03/24/2021) 11/18/2020: LF #25 on 06/25/2020   rosuvastatin (CRESTOR) 40 MG tablet Take 40 mg by mouth at bedtime. 11/18/2020: LF #90/90 days on 08/15/2020   No facility-administered encounter medications on file as of 07/11/2021.   Have you had any problems recently with your health?Patient states that she is not having any new health issues. Patient stated that she is not taking rosuvastatin because her cholesterol is fine and controlled. She states that she has not had it fill in a while  Have you had any problems with your pharmacy?Patient states that she is not having any problems with getting her medications or the cost of medications from the pharmacy  What issues or side effects are you having with your medications? Patient states that she does not have any side effects from medications  What would you like me to pass along to [facility] for them to help you with? Patient reports she is doing well and  does not have any concerns about medication or health at this time  What can we do to take care of you better? Patient states nothing at this time  Care Gaps: Colonoscopy-NA Diabetic Foot Exam-NA Mammogram-NA Ophthalmology-07/29/11 Dexa Scan - NA Annual Well Visit - NA Micro albumin-NA Hemoglobin A1c- BA  Star Rating Drugs: Rosuvastatin 40 mg-last fill 03/09/21   Velvet Bathe Clinical Pharmacist Assistant [phone]

## 2021-07-15 ENCOUNTER — Ambulatory Visit (INDEPENDENT_AMBULATORY_CARE_PROVIDER_SITE_OTHER): Payer: Medicare Other

## 2021-07-15 DIAGNOSIS — Z0000 Encounter for general adult medical examination without abnormal findings: Secondary | ICD-10-CM

## 2021-07-15 NOTE — Progress Notes (Signed)
Subjective:   Mercedes [name] is a 65 y.o. female who presents for Medicare Annual (Subsequent) preventive examination.  I connected with Mercedes [name] today by telephone and verified that I am speaking with the correct person using two identifiers. Location patient: home Location provider: work Persons participating in the virtual visit: patient, provider.   I discussed the limitations, risks, security and privacy concerns of performing an evaluation and management service by telephone and the availability of in person appointments. I also discussed with the patient that there may be a patient responsible charge related to this service. The patient expressed understanding and verbally consented to this telephonic visit.    Interactive audio and video telecommunications were attempted between this provider and patient, however failed, due to patient having technical difficulties OR patient did not have access to video capability.  We continued and completed visit with audio only.    Review of Systems           Objective:    There were no vitals filed for this visit. There is no height or weight on file to calculate BMI.  Advanced Directives 11/19/2020 11/01/2020 08/23/2020 04/29/2020 11/21/2019 11/03/2019 09/26/2019  Does Patient Have a Medical Advance Directive? No No No No No No -  Would patient like information on creating a medical advance directive? No - Patient declined No - Patient declined No - Patient declined No - Patient declined No - Patient declined No - Patient declined No - Patient declined  Pre-existing out of facility DNR order (yellow form or pink MOST form) - - - - - - -    Current Medications (verified) Outpatient Encounter Medications as of 07/15/2021  Medication Sig   acetaminophen (TYLENOL) 500 MG tablet Take 500-1,000 mg by mouth every 8 (eight) hours as needed for mild pain or headache. (Patient not taking: Reported on 03/24/2021)   ELIQUIS 5 MG TABS tablet  TAKE 1 TABLET BY MOUTH TWICE DAILY (Patient taking differently: Take 5 mg by mouth 2 (two) times daily.)   metoprolol tartrate (LOPRESSOR) 25 MG tablet TAKE 1/2 TABLET BY MOUTH TWICE DAILY.   nitroGLYCERIN (NITROSTAT) 0.4 MG SL tablet Place 0.4 mg under the tongue every 4 (four) hours as needed for chest pain. (Patient not taking: Reported on 03/24/2021)   rosuvastatin (CRESTOR) 40 MG tablet Take 40 mg by mouth at bedtime.   No facility-administered encounter medications on file as of 07/15/2021.    Allergies (verified) Brilinta [ticagrelor], Latex, Tape, and Zetia [ezetimibe]   History: Past Medical History:  Diagnosis Date   Abnormal LFTs    a. in the past when on Lipitor   Anemia    as a young woman   Anxiety    Arnold-Chiari malformation, type I (HCC)    MRI brain 02/2010   CAD (coronary artery disease)    a. NSTEMI 07/2009: LHC - D2 40%, LAD irreg., EF 50% with apical AK (tako-tsubo CM);  b. Inf STEMI (04/2013): Tx Promus DES to CFX;  c. 08/2013 Lexi CL: No ischemia, dist ant/ap/inf-ap infarct, EF  d. 06/2016 NSTEMI with LM disease: s/p CABG on 07/17/2016 w/ LIMA-LAD, SVG-OM, and SVG-dRCA.   DEPRESSION    Dizziness    ? 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 anticoagulated with Eliquis 10/2014   Presence of permanent cardiac pacemaker 10/2014   leadless permanent pacemaker   RA (rheumatoid arthritis) (HCC)    prior tx by Dr. Dareen Piano   Sjogren's syndrome [facility]) 06/02/13   pt denies this (12/01/13)   SLE (systemic lupus erythematosus) (HCC) dx 05/2013   follows with rheum Dareen Piano)   Tachy-brady syndrome [facility])    a. 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   f/u echo 09/2009: EF 50-55%, mild LVH, mod diast dysfxn, mild apical HK   Wears dentures    Wears glasses    Past Surgical History:  Procedure Laterality Date   ABDOMINAL AORTAGRAM N/A 11/24/2013   Procedure: ABDOMINAL AORTAGRAM WITH BILATERAL RUNOFF WITH POSSIBLE INTERVENTION;  Surgeon: Chuck Hint, MD;  Location: [facility] CATH LAB;  Service: Cardiovascular;  Laterality: N/A;   ABDOMINAL AORTOGRAM W/LOWER EXTREMITY N/A 04/25/2019   Procedure: ABDOMINAL AORTOGRAM W/LOWER EXTREMITY;  Surgeon: Sherren Kerns, MD;  Location: [facility] INVASIVE CV LAB;  Service: Cardiovascular;  Laterality: N/A;   ABDOMINAL AORTOGRAM W/LOWER EXTREMITY Bilateral 05/15/2019   Procedure: ABDOMINAL AORTOGRAM W/LOWER EXTREMITY;  Surgeon: Nada Libman, MD;  Location: [facility] INVASIVE CV LAB;  Service: Cardiovascular;  Laterality: Bilateral;   ABDOMINAL AORTOGRAM W/LOWER EXTREMITY N/A 08/23/2020   Procedure: ABDOMINAL AORTOGRAM W/LOWER EXTREMITY;  Surgeon: Maeola Harman, MD;  Location: [facility] INVASIVE CV LAB;  Service: Cardiovascular;  Laterality: N/A;   ABDOMINAL AORTOGRAM W/LOWER EXTREMITY N/A 10/29/2020   Procedure: ABDOMINAL AORTOGRAM W/LOWER EXTREMITY;  Surgeon: Sherren Kerns, MD;  Location: [facility] INVASIVE CV LAB;  Service: Cardiovascular;  Laterality: N/A;   AMPUTATION Left 12/02/2013   Procedure: LEFT 1ST TOE AMPUTATION;  Surgeon: Sherren Kerns, MD;  Location: [facility] OR;  Service: Vascular;  Laterality: Left;   AMPUTATION Left 07/16/2019   Procedure: LEFT TRANSMETATARSAL AMPUTATION;  Surgeon: Nadara Mustard, MD;  Location: [facility] OR;  Service: Orthopedics;  Laterality: Left;   AMPUTATION Left 10/01/2019   Procedure: LEFT AMPUTATION ABOVE KNEE;  Surgeon: Sherren Kerns, MD;  Location: [facility] OR;  Service: Vascular;  Laterality: Left;   AMPUTATION Right 08/31/2020   Procedure: Right first toe amputation;  Surgeon: Sherren Kerns, MD;  Location: [facility]  OR;  Service: Vascular;  Laterality: Right;   AMPUTATION Right 11/23/2020   Procedure: RIGHT AMPUTATION ABOVE KNEE;  Surgeon: Sherren Kerns, MD;  Location: [facility] OR;  Service: Vascular;  Laterality: Right;   APPLICATION OF WOUND VAC Left 11/03/2019   Procedure: Application Of Wound Vac, left lower extremity;  Surgeon: Sherren Kerns, MD;  Location: [facility] OR;  Service: Vascular;  Laterality: Left;   CARDIAC CATHETERIZATION N/A 07/12/2016   Procedure: Left Heart Cath and Coronary Angiography;  Surgeon: Lennette Bihari, MD;  [facility]: [facility] INVASIVE CV LAB;  Service: Cardiovascular;  Laterality: N/A;   CARDIAC CATHETERIZATION N/A 07/14/2016   Procedure: Intravascular Ultrasound/IVUS;  Surgeon: Yvonne Kendall, MD;  Location: [facility] INVASIVE CV LAB;  Service: Cardiovascular;  Laterality: N/A;   COLONOSCOPY     CORONARY ANGIOPLASTY  04/2013   CORONARY ARTERY BYPASS GRAFT N/A 07/17/2016   Procedure: CORONARY ARTERY BYPASS GRAFTING (CABG)x3 using right greater saphenous vein harvested endoscopiclly and left internal mammary artery;  Surgeon: Kerin Perna, MD;  Location: [facility] OR;  Service: Open Heart Surgery;  Laterality: N/A;  ENDARTERECTOMY FEMORAL Left 05/12/2019   Procedure: LEFT Femoral Endarterectomy;  Surgeon: Sherren Kerns, MD;  Location: [facility] OR;  Service: Vascular;  Laterality: Left;   FEMORAL-POPLITEAL BYPASS GRAFT Left 12/02/2013   Procedure:  LEFT FEMORAL-BELOW KNEE POPLITEAL ARTERY BYPASS GRAFT;  Surgeon: Sherren Kerns, MD;  Location: [facility] OR;  Service: Vascular;  Laterality: Left;   FEMORAL-POPLITEAL BYPASS GRAFT Left 09/25/2019   Procedure: THROMBECTOMY and PROPATEN BYPASS  FEMORAL TO BELOW KNEE POPLITEAL ARTERY BYPASS GRAFT LEFT LEG;  Surgeon: Larina Earthly, MD;  Location[facility] OR;  Service: Vascular;  Laterality: Left;   FEMORAL-POPLITEAL BYPASS GRAFT Right 08/24/2020   Procedure: RIGHT FEMORAL-BELOW KNEE POPLITEAL ARTERY BYPASS USING GORE PROPATEN GRAFT;  Surgeon: Maeola Harman, MD;   Location: [facility] OR;  Service: Vascular;  Laterality: Right;   FEMORAL-POPLITEAL BYPASS GRAFT Right 11/01/2020   Procedure: Thrombectomy and Revision of Right Femoral to below knee Bypass with Interpostional graft to Tibial / peroneal Trunk and Endarterectomy Tibial Weston Settle Trunk.;  Surgeon: Sherren Kerns, MD;  Location: [facility] OR;  Service: Vascular;  Laterality: Right;   INTRAOPERATIVE ARTERIOGRAM Right 11/01/2020   Procedure: ANTEGRADE RIGHT LOWER EXTREMITY INTRA OPERATIVE ARTERIOGRAM;  Surgeon: Sherren Kerns, MD;  Location: [facility] OR;  Service: Vascular;  Laterality: Right;   LEFT HEART CATHETERIZATION WITH CORONARY ANGIOGRAM N/A 05/19/2013   Procedure: LEFT HEART CATHETERIZATION WITH CORONARY ANGIOGRAM;  Surgeon: Micheline Chapman, MD;  Location: [facility] CATH LAB;  Service: Cardiovascular;  Laterality: N/A;   LEFT HEART CATHETERIZATION WITH CORONARY ANGIOGRAM N/A 10/28/2014   Procedure: LEFT HEART CATHETERIZATION WITH CORONARY ANGIOGRAM;  Surgeon: Iran Ouch, MD;  [facility] CATH LAB;  Service: Cardiovascular;  Laterality: N/A;   LOWER EXTREMITY ANGIOGRAM Left 05/12/2019   Procedure: Lower Extremity Angiogram;  Surgeon: Sherren Kerns, MD;  Location: [facility] OR;  Service: Vascular;  Laterality: Left;   PATCH ANGIOPLASTY Left 05/12/2019   Procedure: Left Femoral Patch Angioplasty USING CADAVERIC SAPHENOUS VEIN;  Surgeon: Sherren Kerns, MD;  Location: [facility] OR;  Service: Vascular;  Laterality: Left;   PERCUTANEOUS CORONARY STENT INTERVENTION (PCI-S)  05/19/2013   Procedure: PERCUTANEOUS CORONARY STENT INTERVENTION (PCI-S);  Surgeon: Micheline Chapman, MD;  Location: [facility] CATH LAB;  Service: Cardiovascular;;   PERIPHERAL VASCULAR INTERVENTION Right 10/29/2020   Procedure: PERIPHERAL VASCULAR INTERVENTION;  Surgeon: Sherren Kerns, MD;  Location: [facility] INVASIVE CV LAB;  Service: Cardiovascular;  Laterality: Right;  common iliac   PERMANENT PACEMAKER INSERTION N/A 11/04/2014   STJ Leadless pacemaker implanted by Dr  Johney Frame for tachy/brady   TEE WITHOUT CARDIOVERSION N/A 07/17/2016   Procedure: TRANSESOPHAGEAL ECHOCARDIOGRAM (TEE);  Surgeon: Kerin Perna, MD;  Location: [facility] OR;  Service: Open Heart Surgery;  Laterality: N/A;   THROMBECTOMY FEMORAL ARTERY Left 05/12/2019   Procedure: Left Ilio-Femoral Artery and Femoral-popliteal Graft Thrombectomy;  Surgeon: Sherren Kerns, MD;  Location: [facility] OR;  Service: Vascular;  Laterality: Left;   THROMBECTOMY OF BYPASS GRAFT FEMORAL- POPLITEAL ARTERY Right 11/01/2020   Procedure: POSSIBLE THROMBOLYSIS VERSUS OPEN THROMBECTOMY AND REVSION OF RIGHT FEMORAL-POPLITEAL ARTERY BYPASS;  Surgeon: Sherren Kerns, MD;  [facility] OR;  Service: Vascular;  Laterality: Right;   TUBAL LIGATION     VISCERAL ANGIOGRAPHY N/A 04/25/2019   Procedure: MESENTRIC  ANGIOGRAPHY;  Surgeon: Sherren Kerns, MD;  [facility] INVASIVE CV LAB;  Service: Cardiovascular;  Laterality: N/A;   WOUND DEBRIDEMENT Left 11/03/2019   Procedure: Debridement Wound of left above the knee amputation;  Surgeon: Fabienne Bruns  E, MD;  Location[facility] OR;  Service: Vascular;  Laterality: Left;   Family History  Problem Relation Age of Onset   Arthritis Mother    Emphysema Mother    Arthritis Father    COPD Father    Heart disease Father    Diabetes Paternal Aunt        paternal Aunts   Heart disease Paternal Aunt    Thyroid disease Neg Hx    Social History   Socioeconomic History   Marital status: Single    Spouse name: Not on file   Number of children: 3   Years of education: Not on file   Highest education level: Not on file  Occupational History   Occupation: retired  Tobacco Use   Smoking status: Former    Types: Cigarettes    Quit date: 2019    Years since quitting: 3.8   Smokeless tobacco: Never   Tobacco comments:       Vaping Use   Vaping Use: Never used  Substance and Sexual Activity   Alcohol use: No    Alcohol/week: 0.0 standard drinks   Drug use: Not Currently    Types:  Marijuana, Other-see comments    Comment:  and CBD oil  last use of both was 10/29/19   Sexual activity: Not on file  Other Topics Concern   Not on file  Social History Narrative   Lives alone.   Social Determinants of Health   Financial Resource Strain: Not on file  Food Insecurity: Not on file  Transportation Needs: Not on file  Physical Activity: Not on file  Stress: Not on file  Social Connections: Not on file    Tobacco Counseling Counseling given: Not Answered Tobacco comments:     Clinical Intake:                 Diabetic?no         Activities of Daily Living In your present state of health, do you have any difficulty performing the following activities: 11/20/2020 11/19/2020  Hearing? - N  Vision? - N  Difficulty concentrating or making decisions? - N  Walking or climbing stairs? - Y  Dressing or bathing? - N  Doing errands, shopping? Y -  Some recent data might be hidden    Patient Care Team: Pincus Sanes, MD as PCP - General (Internal Medicine) Jens Som Madolyn Frieze, MD as PCP - Cardiology (Cardiology) Hillis Range, MD as PCP - Electrophysiology (Cardiology) Jens Som Madolyn Frieze, MD (Cardiology) Sherren Kerns, MD (Vascular Surgery) Lysle Rubens, OT/L as Occupational Therapist (Occupational Therapy) Ellin Saba, [facility] as Pharmacist (Pharmacist)  Indicate any recent Medical Services you may have received from other than [facility] providers in the past year (date may be approximate).     Assessment:   This is a routine wellness examination for [place].  Hearing/Vision screen No results found.  Dietary issues and exercise activities discussed:     Goals Addressed   None    Depression Screen PHQ 2/9 Scores 03/18/2021 07/29/2020 06/30/2020 04/29/2020 02/14/2019 12/08/2016  PHQ - 2 Score 2 0 1 0 2 0  PHQ- 9 Score 5 - - - 5 -    Fall Risk Fall Risk  07/13/2020 04/29/2020 02/14/2019 11/30/2016  Falls in the past year? 0 1 0 No  Number falls  in past yr: 0 0 0 -  Injury with Fall? 0 1 0 -  Risk for fall due to : Impaired mobility;Other (Comment) Other (  Comment) Other (Comment) -  Risk for fall due to: Comment LAKA osteomyelitis Weight loss/weakness -  Follow up Falls evaluation completed Falls evaluation completed Falls evaluation completed -    FALL RISK PREVENTION PERTAINING TO THE HOME:  Any stairs in or around the home? Yes  If so, are there any without handrails? No  Home free of loose throw rugs in walkways, pet beds, electrical cords, etc? No  Adequate lighting in your home to reduce risk of falls? No   ASSISTIVE DEVICES UTILIZED TO PREVENT FALLS:  Life alert? No  Use of a cane, walker or w/c? Yes  Grab bars in the bathroom? Yes  Shower chair or bench in shower? Yes  Elevated toilet seat or a handicapped toilet? Yes    Cognitive Function:  Normal cognitive status assessed by direct observation by this Nurse Health Advisor. No abnormalities found.        Immunizations Immunization History  Administered Date(s) Administered   Influenza,inj,Quad PF,6+ Mos 07/04/2013, 07/21/2013   Influenza-Unspecified 06/25/2013   Td 08/29/2004   Tdap 08/28/2013    TDAP status: Up to date  Flu Vaccine status: Up to date  Pneumococcal vaccine status: Up to date  Covid-19 vaccine status: Completed vaccines  Qualifies for Shingles Vaccine? Yes   Zostavax completed No   Shingrix Completed?: No.    Education has been provided regarding the importance of this vaccine. Patient has been advised to call insurance company to determine out of pocket expense if they have not yet received this vaccine. Advised may also receive vaccine at local pharmacy or Health Dept. Verbalized acceptance and understanding.  Screening Tests Health Maintenance  Topic Date Due   COVID-19 Vaccine (1) Never done   Pneumonia Vaccine 73+ Years old (1 - PCV) Never done   FOOT EXAM  Never done   URINE MICROALBUMIN  Never done   Zoster Vaccines-  Shingrix (1 of 2) Never done   COLONOSCOPY (Pts 45-46yrs Insurance coverage will need to be confirmed)  Never done   MAMMOGRAM  Never done   OPHTHALMOLOGY EXAM  07/28/2012   DEXA SCAN  Never done   HEMOGLOBIN A1C  04/21/2021   TETANUS/TDAP  08/29/2023   Hepatitis C Screening  Completed   HPV VACCINES  Aged Out   INFLUENZA VACCINE  Discontinued    Health Maintenance  Health Maintenance Due  Topic Date Due   COVID-19 Vaccine (1) Never done   Pneumonia Vaccine 23+ Years old (1 - PCV) Never done   FOOT EXAM  Never done   URINE MICROALBUMIN  Never done   Zoster Vaccines- Shingrix (1 of 2) Never done   COLONOSCOPY (Pts 45-73yrs Insurance coverage will need to be confirmed)  Never done   MAMMOGRAM  Never done   OPHTHALMOLOGY EXAM  07/28/2012   DEXA SCAN  Never done   HEMOGLOBIN A1C  04/21/2021    Colorectal cancer screening: Referral to GI placed referral declined . Pt aware the office will call re: appt.  Mammogram status: Ordered referral declined patient states shew will schedule . Pt provided with contact info and advised to call to schedule appt.   Bone Density status: Ordered declined by patient . Pt provided with contact info and advised to call to schedule appt.  Lung Cancer Screening: (Low Dose CT Chest recommended if Age 83-80 years, 30 pack-year currently smoking OR have quit w/in 15years.) does qualify.   Lung Cancer Screening Referral: patient declined screening   Additional Screening:  Hepatitis C Screening: does  not qualify;   Vision Screening: Recommended annual ophthalmology exams for early detection of glaucoma and other disorders of the eye. Is the patient up to date with their annual eye exam?  Yes  Who is the provider or what is the name of the office in which the patient attends annual eye exams? Patient states she sees several eye doctors  If pt is not established with a provider, would they like to be referred to a provider to establish care? No .    Dental Screening: Recommended annual dental exams for proper oral hygiene  Community Resource Referral / Chronic Care Management: CRR required this visit?  No   CCM required this visit?  No      Plan:     I have personally reviewed and noted the following in the patient's chart:   Medical and social history Use of alcohol, tobacco or illicit drugs  Current medications and supplements including opioid prescriptions.  Functional ability and status Nutritional status Physical activity Advanced directives List of other physicians Hospitalizations, surgeries, and ER visits in previous 12 months Vitals Screenings to include cognitive, depression, and falls Referrals and appointments  In addition, I have reviewed and discussed with patient certain preventive protocols, quality metrics, and best practice recommendations. A written personalized care plan for preventive services as well as general preventive health recommendations were provided to patient.     March Rummage, LPN   [id number]   Nurse Notes: Patient was angry with receiving a telephone call for medicare wellness upset through out the call over being asked if she was a diabetic as it was listed in her chart.   Offered a office visit to discuss with Dr.  Lawerance Bach and she demanded I check for appointment immediately, offered her 7 different  appointment dates  and various times which declined states she had more important visits to attend.  State she has addressed this many time with various office staff and Dr. Lawerance Bach and that [facility] needs to get her record straight.  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  When asked Questions under SDOH regarding social connections , patient became very angry about Food Insecurity question. Attempted to calm her by listening and  trying to determine her need.  Patient was offered a social work referral and declined yelling she has food stamps and no transportation what was I going to do about that? Again I offered a referral to Child psychotherapist and tried explaining they can possible offer you some options and community resources, patient then states why do you ask if you cannot help me.  I calmly stated again that I would be glad to send a referral on behalf of Dr. Lawerance Bach to discuss with a social worker her needs and specific situation. She then states hurry up I need to go get this over with you have been no help.  Finished the questions,  addresses her health maintenance care gaps offered referrals for mammogram, Bone scan , Colonoscopy and Lung Cancer screening.  Patient declined  all referrals and stated she will make her own appointment on her own time. Attempted to explain that  a referral would allow her to schedule these appointments to accommodate her schedule.  Patient again declined.   L.Rahel Carlton,LPN

## 2021-07-15 NOTE — Patient Instructions (Signed)
[name] , Thank you for taking time to come for your Medicare Wellness Visit. I appreciate your ongoing commitment to your health goals. Please review the following plan we discussed and let me know if I can assist you in the future.   Screening recommendations/referrals: Colonoscopy: Patient declined referral  Mammogram: patient declined referral  Bone Density: patient declined referral  Recommended yearly ophthalmology/optometry visit for glaucoma screening and checkup Recommended yearly dental visit for hygiene and checkup  Vaccinations: Influenza vaccine: declined  Pneumococcal vaccine: declined Tdap vaccine: 08/28/2013 Shingles vaccine: declined     Advanced directives: none   Conditions/risks identified: none   Next appointment: Patient declined offered appointments   Preventive Care 65 Years and Older, Female Preventive care refers to lifestyle choices and visits with your health care provider that can promote health and wellness. What does preventive care include? A yearly physical exam. This is also called an annual well check. Dental exams once or twice a year. Routine eye exams. Ask your health care provider how often you should have your eyes checked. Personal lifestyle choices, including: Daily care of your teeth and gums. Regular physical activity. Eating a healthy diet. Avoiding tobacco and drug use. Limiting alcohol use. Practicing safe sex. Taking low-dose aspirin every day. Taking vitamin and mineral supplements as recommended by your health care provider. What happens during an annual well check? The services and screenings done by your health care provider during your annual well check will depend on your age, overall health, lifestyle risk factors, and family history of disease. Counseling  Your health care provider may ask you questions about your: Alcohol use. Tobacco use. Drug use. Emotional well-being. Home and relationship  well-being. Sexual activity. Eating habits. History of falls. Memory and ability to understand (cognition). Work and work Astronomer. Reproductive health. Screening  You may have the following tests or measurements: Height, weight, and BMI. Blood pressure. Lipid and cholesterol levels. These may be checked every 5 years, or more frequently if you are over 40 years old. Skin check. Lung cancer screening. You may have this screening every year starting at age 54 if you have a 30-pack-year history of smoking and currently smoke or have quit within the past 15 years. Fecal occult blood test (FOBT) of the stool. You may have this test every year starting at age 57. Flexible sigmoidoscopy or colonoscopy. You may have a sigmoidoscopy every 5 years or a colonoscopy every 10 years starting at age 72. Hepatitis C blood test. Hepatitis B blood test. Sexually transmitted disease (STD) testing. Diabetes screening. This is done by checking your blood sugar (glucose) after you have not eaten for a while (fasting). You may have this done every 1-3 years. Bone density scan. This is done to screen for osteoporosis. You may have this done starting at age 54. Mammogram. This may be done every 1-2 years. Talk to your health care provider about how often you should have regular mammograms. Talk with your health care provider about your test results, treatment options, and if necessary, the need for more tests. Vaccines  Your health care provider may recommend certain vaccines, such as: Influenza vaccine. This is recommended every year. Tetanus, diphtheria, and acellular pertussis (Tdap, Td) vaccine. You may need a Td booster every 10 years. Zoster vaccine. You may need this after age 32. Pneumococcal 13-valent conjugate (PCV13) vaccine. One dose is recommended after age 9. Pneumococcal polysaccharide (PPSV23) vaccine. One dose is recommended after age 68. Talk to your health care provider about  which  screenings and vaccines you need and how often you need them. This information is not intended to replace advice given to you by your health care provider. Make sure you discuss any questions you have with your health care provider. Document Released: 09/10/2015 Document Revised: 05/03/2016 Document Reviewed: 06/15/2015 Elsevier Interactive Patient Education  2017 [place] Prevention in the Home Falls can cause injuries. They can happen to people of all ages. There are many things you can do to make your home safe and to help prevent falls. What can I do on the outside of my home? Regularly fix the edges of walkways and driveways and fix any cracks. Remove anything that might make you trip as you walk through a door, such as a raised step or threshold. Trim any bushes or trees on the path to your home. Use bright outdoor lighting. Clear any walking paths of anything that might make someone trip, such as rocks or tools. Regularly check to see if handrails are loose or broken. Make sure that both sides of any steps have handrails. Any raised decks and porches should have guardrails on the edges. Have any leaves, snow, or ice cleared regularly. Use sand or salt on walking paths during winter. Clean up any spills in your garage right away. This includes oil or grease spills. What can I do in the bathroom? Use night lights. Install grab bars by the toilet and in the tub and shower. Do not use towel bars as grab bars. Use non-skid mats or decals in the tub or shower. If you need to sit down in the shower, use a plastic, non-slip stool. Keep the floor dry. Clean up any water that spills on the floor as soon as it happens. Remove soap buildup in the tub or shower regularly. Attach bath mats securely with double-sided non-slip rug tape. Do not have throw rugs and other things on the floor that can make you trip. What can I do in the bedroom? Use night lights. Make sure that you have a  light by your bed that is easy to reach. Do not use any sheets or blankets that are too big for your bed. They should not hang down onto the floor. Have a firm chair that has side arms. You can use this for support while you get dressed. Do not have throw rugs and other things on the floor that can make you trip. What can I do in the kitchen? Clean up any spills right away. Avoid walking on wet floors. Keep items that you use a lot in easy-to-reach places. If you need to reach something above you, use a strong step stool that has a grab bar. Keep electrical cords out of the way. Do not use floor polish or wax that makes floors slippery. If you must use wax, use non-skid floor wax. Do not have throw rugs and other things on the floor that can make you trip. What can I do with my stairs? Do not leave any items on the stairs. Make sure that there are handrails on both sides of the stairs and use them. Fix handrails that are broken or loose. Make sure that handrails are as long as the stairways. Check any carpeting to make sure that it is firmly attached to the stairs. Fix any carpet that is loose or worn. Avoid having throw rugs at the top or bottom of the stairs. If you do have throw rugs, attach them to the floor with  carpet tape. Make sure that you have a light switch at the top of the stairs and the bottom of the stairs. If you do not have them, ask someone to add them for you. What else can I do to help prevent falls? Wear shoes that: Do not have high heels. Have rubber bottoms. Are comfortable and fit you well. Are closed at the toe. Do not wear sandals. If you use a stepladder: Make sure that it is fully opened. Do not climb a closed stepladder. Make sure that both sides of the stepladder are locked into place. Ask someone to hold it for you, if possible. Clearly mark and make sure that you can see: Any grab bars or handrails. First and last steps. Where the edge of each step  is. Use tools that help you move around (mobility aids) if they are needed. These include: Canes. Walkers. Scooters. Crutches. Turn on the lights when you go into a dark area. Replace any light bulbs as soon as they burn out. Set up your furniture so you have a clear path. Avoid moving your furniture around. If any of your floors are uneven, fix them. If there are any pets around you, be aware of where they are. Review your medicines with your doctor. Some medicines can make you feel dizzy. This can increase your chance of falling. Ask your doctor what other things that you can do to help prevent falls. This information is not intended to replace advice given to you by your health care provider. Make sure you discuss any questions you have with your health care provider. Document Released: 06/10/2009 Document Revised: 01/20/2016 Document Reviewed: 09/18/2014 Elsevier Interactive Patient Education  2017 [employer].

## 2021-07-21 DIAGNOSIS — I10 Essential (primary) hypertension: Secondary | ICD-10-CM | POA: Diagnosis not present

## 2021-07-21 DIAGNOSIS — Z89611 Acquired absence of right leg above knee: Secondary | ICD-10-CM | POA: Diagnosis not present

## 2021-07-21 DIAGNOSIS — Z89612 Acquired absence of left leg above knee: Secondary | ICD-10-CM | POA: Diagnosis not present

## 2021-07-29 DIAGNOSIS — Z4781 Encounter for orthopedic aftercare following surgical amputation: Secondary | ICD-10-CM | POA: Diagnosis not present

## 2021-07-29 DIAGNOSIS — I739 Peripheral vascular disease, unspecified: Secondary | ICD-10-CM | POA: Diagnosis not present

## 2021-08-02 ENCOUNTER — Telehealth: Payer: Self-pay

## 2021-08-02 NOTE — Telephone Encounter (Signed)
Pt called to make appt. 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 She also needs to get rx for prosthesis. She has been given an APP appt this week.

## 2021-08-04 ENCOUNTER — Other Ambulatory Visit: Payer: Self-pay

## 2021-08-04 ENCOUNTER — Encounter: Payer: Self-pay | Admitting: Physician Assistant

## 2021-08-04 ENCOUNTER — Ambulatory Visit (INDEPENDENT_AMBULATORY_CARE_PROVIDER_SITE_OTHER): Payer: Medicare Other | Admitting: Physician Assistant

## 2021-08-04 VITALS — BP 179/84 | HR 73 | Temp 97.7°F | Resp 20

## 2021-08-04 DIAGNOSIS — I739 Peripheral vascular disease, unspecified: Secondary | ICD-10-CM

## 2021-08-04 DIAGNOSIS — Z89612 Acquired absence of left leg above knee: Secondary | ICD-10-CM | POA: Diagnosis not present

## 2021-08-04 DIAGNOSIS — Z89611 Acquired absence of right leg above knee: Secondary | ICD-10-CM

## 2021-08-04 MED ORDER — CEPHALEXIN 500 MG PO CAPS: 500.0000 mg | ORAL_CAPSULE | Freq: Two times a day (BID) | ORAL | 0 refills | Status: DC

## 2021-08-04 NOTE — Progress Notes (Signed)
Office Note     CC:  follow up Requesting Provider:  Binnie Rail, MD  HPI: Mercedes [name] is a 65 y.o. (1955-11-28) female who presents for follow up of bilateral AKA's for referral to [place] for Prosthesis. She additionally reports over the past 1 month she developed a scab on her right AKA over the bone. She says she does not recall hitting it on anything. The scab has been sore and she noticed a small dot of pus from it this past week. She has not had any bleeding, redness, fever or chills. She says occasionally the stumps will feel cool. She also says she has pain in the front of both of her thighs when lying flat. When sitting she as not issues. She describes it as a muscle like pain. Says this has been present since her right AKA. She explains that she is no longer in therapy. She is able to transfer on her own. She mostly uses her electric wheelchair to get around.  The pt is on a statin for cholesterol management.  The pt is not on a daily aspirin.   Other AC:  Eliquis The pt is on BB for hypertension.   The pt is not diabetic.   Tobacco hx:  former, quit 2019  Past Medical History:  Diagnosis Date   Abnormal LFTs    a. in the past when on Lipitor   Anemia    as a young woman   Anxiety    Arnold-Chiari malformation, type I ([place])    MRI brain 02/2010   CAD (coronary artery disease)    a. NSTEMI 07/2009: LHC - D2 40%, LAD irreg., EF 50% with apical AK (tako-tsubo CM);  b. Inf STEMI (04/2013): Tx Promus DES to CFX;  c. 08/2013 Lexi CL: No ischemia, dist ant/ap/inf-ap infarct, EF  d. 06/2016 NSTEMI with LM disease: s/p CABG on 07/17/2016 w/ LIMA-LAD, SVG-OM, and SVG-dRCA.   DEPRESSION    Dizziness    ? CVA 01/2010 - carotid dopplers with no ICA stenosis   GERD (gastroesophageal reflux disease)    Headache    History of transmetatarsal amputation of left foot ([place]) 07/17/2019   HYPERLIPIDEMIA    HYPERTENSION    MI (myocardial infarction) ([place])    PAD (peripheral artery  disease) ([place])    s/p L fem pop 11/2013 in setting of 1st toe osteo/gangrene   Paroxysmal atrial fibrillation (HCC)    a. anticoagulated with Eliquis 10/2014   Presence of permanent cardiac pacemaker 10/2014   leadless permanent pacemaker   RA (rheumatoid arthritis) ([place])    prior tx by Dr. Ouida Sills   Sjogren's syndrome ([place]) 06/02/13   pt denies this (12/01/13)   SLE (systemic lupus erythematosus) ([place]) dx 05/2013   follows with rheum Ouida Sills)   Tachy-brady syndrome [facility])    a. 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   f/u echo 09/2009: EF 50-55%, mild LVH, mod diast dysfxn, mild apical HK   Wears dentures    Wears glasses     Past Surgical History:  Procedure Laterality Date   ABDOMINAL AORTAGRAM N/A 11/24/2013   Procedure: ABDOMINAL AORTAGRAM WITH BILATERAL RUNOFF WITH POSSIBLE INTERVENTION;  Surgeon: Angelia Mould, MD;  Location: [facility] CATH LAB;  Service: Cardiovascular;  Laterality: N/A;   ABDOMINAL AORTOGRAM W/LOWER EXTREMITY N/A 04/25/2019   Procedure: ABDOMINAL AORTOGRAM W/LOWER EXTREMITY;  Surgeon: Elam Dutch, MD;  Location: [place] CV LAB;  Service: Cardiovascular;  Laterality: N/A;  ABDOMINAL AORTOGRAM W/LOWER EXTREMITY Bilateral 05/15/2019   Procedure: ABDOMINAL AORTOGRAM W/LOWER EXTREMITY;  Surgeon: Nada Libman, MD;  Location: [facility] INVASIVE CV LAB;  Service: Cardiovascular;  Laterality: Bilateral;   ABDOMINAL AORTOGRAM W/LOWER EXTREMITY N/A 08/23/2020   Procedure: ABDOMINAL AORTOGRAM W/LOWER EXTREMITY;  Surgeon: Maeola Harman, MD;  Location: [facility] INVASIVE CV LAB;  Service: Cardiovascular;  Laterality: N/A;   ABDOMINAL AORTOGRAM W/LOWER EXTREMITY N/A 10/29/2020   Procedure: ABDOMINAL AORTOGRAM W/LOWER EXTREMITY;  Surgeon: Sherren Kerns, MD;  Location: [facility] INVASIVE CV LAB;  Service: Cardiovascular;  Laterality: N/A;   AMPUTATION Left 12/02/2013   Procedure: LEFT 1ST TOE AMPUTATION;  Surgeon: Sherren Kerns,  MD;  Location: [facility] OR;  Service: Vascular;  Laterality: Left;   AMPUTATION Left 07/16/2019   Procedure: LEFT TRANSMETATARSAL AMPUTATION;  Surgeon: Nadara Mustard, MD;  Location: [facility] OR;  Service: Orthopedics;  Laterality: Left;   AMPUTATION Left 10/01/2019   Procedure: LEFT AMPUTATION ABOVE KNEE;  Surgeon: Sherren Kerns, MD;  Location: [facility] OR;  Service: Vascular;  Laterality: Left;   AMPUTATION Right 08/31/2020   Procedure: Right first toe amputation;  Surgeon: Sherren Kerns, MD;  Location: [facility] OR;  Service: Vascular;  Laterality: Right;   AMPUTATION Right 11/23/2020   Procedure: RIGHT AMPUTATION ABOVE KNEE;  Surgeon: Sherren Kerns, MD;  Location: [facility] OR;  Service: Vascular;  Laterality: Right;   APPLICATION OF WOUND VAC Left 11/03/2019   Procedure: Application Of Wound Vac, left lower extremity;  Surgeon: Sherren Kerns, MD;  Location: [facility] OR;  Service: Vascular;  Laterality: Left;   CARDIAC CATHETERIZATION N/A 07/12/2016   Procedure: Left Heart Cath and Coronary Angiography;  Surgeon: Lennette Bihari, MD;  [facility]: [facility] INVASIVE CV LAB;  Service: Cardiovascular;  Laterality: N/A;   CARDIAC CATHETERIZATION N/A 07/14/2016   Procedure: Intravascular Ultrasound/IVUS;  Surgeon: Yvonne Kendall, MD;  Location: [facility] INVASIVE CV LAB;  Service: Cardiovascular;  Laterality: N/A;   COLONOSCOPY     CORONARY ANGIOPLASTY  04/2013   CORONARY ARTERY BYPASS GRAFT N/A 07/17/2016   Procedure: CORONARY ARTERY BYPASS GRAFTING (CABG)x3 using right greater saphenous vein harvested endoscopiclly and left internal mammary artery;  Surgeon: Kerin Perna, MD;  Location: [facility] OR;  Service: Open Heart Surgery;  Laterality: N/A;   ENDARTERECTOMY FEMORAL Left 05/12/2019   Procedure: LEFT Femoral Endarterectomy;  Surgeon: Sherren Kerns, MD;  Location: [facility] OR;  Service: Vascular;  Laterality: Left;   FEMORAL-POPLITEAL BYPASS GRAFT Left 12/02/2013   Procedure:  LEFT FEMORAL-BELOW KNEE POPLITEAL ARTERY BYPASS GRAFT;  Surgeon:  Sherren Kerns, MD;  Location: [facility] OR;  Service: Vascular;  Laterality: Left;   FEMORAL-POPLITEAL BYPASS GRAFT Left 09/25/2019   Procedure: THROMBECTOMY and PROPATEN BYPASS  FEMORAL TO BELOW KNEE POPLITEAL ARTERY BYPASS GRAFT LEFT LEG;  Surgeon: Larina Earthly, MD;  Location: [facility] OR;  Service: Vascular;  Laterality: Left;   FEMORAL-POPLITEAL BYPASS GRAFT Right 08/24/2020   Procedure: RIGHT FEMORAL-BELOW KNEE POPLITEAL ARTERY BYPASS USING GORE PROPATEN GRAFT;  Surgeon: Maeola Harman, MD;  Location: [facility] OR;  Service: Vascular;  Laterality: Right;   FEMORAL-POPLITEAL BYPASS GRAFT Right 11/01/2020   Procedure: Thrombectomy and Revision of Right Femoral to below knee Bypass with Interpostional graft to Tibial / peroneal Trunk and Endarterectomy Tibial Weston Settle Trunk.;  Surgeon: Sherren Kerns, MD;  Location: [facility] OR;  Service: Vascular;  Laterality: Right;   INTRAOPERATIVE ARTERIOGRAM Right 11/01/2020   Procedure: ANTEGRADE RIGHT LOWER EXTREMITY INTRA OPERATIVE ARTERIOGRAM;  Surgeon: Sherren Kerns, MD;  Location: [facility] OR;  Service: Vascular;  Laterality: Right;   LEFT HEART CATHETERIZATION WITH CORONARY ANGIOGRAM N/A 05/19/2013   Procedure: LEFT HEART CATHETERIZATION WITH CORONARY ANGIOGRAM;  Surgeon: Blane Ohara, MD;  Location: [facility] CATH LAB;  Service: Cardiovascular;  Laterality: N/A;   LEFT HEART CATHETERIZATION WITH CORONARY ANGIOGRAM N/A 10/28/2014   Procedure: LEFT HEART CATHETERIZATION WITH CORONARY ANGIOGRAM;  Surgeon: Wellington Hampshire, MD;  Location: [place] CATH LAB;  Service: Cardiovascular;  Laterality: N/A;   LOWER EXTREMITY ANGIOGRAM Left 05/12/2019   Procedure: Lower Extremity Angiogram;  Surgeon: Elam Dutch, MD;  Location: [facility] OR;  Service: Vascular;  Laterality: Left;   PATCH ANGIOPLASTY Left 05/12/2019   Procedure: Left Femoral Patch Angioplasty USING CADAVERIC SAPHENOUS VEIN;  Surgeon: Elam Dutch, MD;  Location: [facility] OR;  Service: Vascular;  Laterality: Left;    PERCUTANEOUS CORONARY STENT INTERVENTION (PCI-S)  05/19/2013   Procedure: PERCUTANEOUS CORONARY STENT INTERVENTION (PCI-S);  Surgeon: Blane Ohara, MD;  Location: [facility] CATH LAB;  Service: Cardiovascular;;   PERIPHERAL VASCULAR INTERVENTION Right 10/29/2020   Procedure: PERIPHERAL VASCULAR INTERVENTION;  Surgeon: Elam Dutch, MD;  Location: [place] CV LAB;  Service: Cardiovascular;  Laterality: Right;  common iliac   PERMANENT PACEMAKER INSERTION N/A 11/04/2014   STJ Leadless pacemaker implanted by Dr Rayann Heman for tachy/brady   TEE WITHOUT CARDIOVERSION N/A 07/17/2016   Procedure: TRANSESOPHAGEAL ECHOCARDIOGRAM (TEE);  Surgeon: Ivin Poot, MD;  Location: [place];  Service: Open Heart Surgery;  Laterality: N/A;   THROMBECTOMY FEMORAL ARTERY Left 05/12/2019   Procedure: Left Ilio-Femoral Artery and Femoral-popliteal Graft Thrombectomy;  Surgeon: Elam Dutch, MD;  Location: [facility] OR;  Service: Vascular;  Laterality: Left;   THROMBECTOMY OF BYPASS GRAFT FEMORAL- POPLITEAL ARTERY Right 11/01/2020   Procedure: POSSIBLE THROMBOLYSIS VERSUS OPEN THROMBECTOMY AND REVSION OF RIGHT FEMORAL-POPLITEAL ARTERY BYPASS;  Surgeon: Elam Dutch, MD;  Location: [place];  Service: Vascular;  Laterality: Right;   TUBAL LIGATION     VISCERAL ANGIOGRAPHY N/A 04/25/2019   Procedure: MESENTRIC  ANGIOGRAPHY;  Surgeon: Elam Dutch, MD;  Location: [place] CV LAB;  Service: Cardiovascular;  Laterality: N/A;   WOUND DEBRIDEMENT Left 11/03/2019   Procedure: Debridement Wound of left above the knee amputation;  Surgeon: Elam Dutch, MD;  Location: [facility] OR;  Service: Vascular;  Laterality: Left;    Social History   Socioeconomic History   Marital status: Single    Spouse name: Not on file   Number of children: 3   Years of education: Not on file   Highest education level: Not on file  Occupational History   Occupation: retired  Tobacco Use   Smoking status: Former    Types: Cigarettes    Quit  date: 2019    Years since quitting: 3.9   Smokeless tobacco: Never   Tobacco comments:       Vaping Use   Vaping Use: Never used  Substance and Sexual Activity   Alcohol use: No    Alcohol/week: 0.0 standard drinks   Drug use: Not Currently    Types: Marijuana, Other-see comments    Comment:  and CBD oil  last use of both was 10/29/19   Sexual activity: Not on file  Other Topics Concern   Not on file  Social History Narrative   Lives alone.   Social Determinants of Health   Financial Resource Strain: Low Risk    Difficulty of Paying Living Expenses: Not hard at all  Food Insecurity:  No Food Insecurity   Worried About Charity fundraiser in the Last Year: Never true   Ran Out of Food in the Last Year: Never true  Transportation Needs: No Transportation Needs   Lack of Transportation (Medical): No   Lack of Transportation (Non-Medical): No  Physical Activity: Inactive   Days of Exercise per Week: 0 days   Minutes of Exercise per Session: 0 min  Stress: No Stress Concern Present   Feeling of Stress : Not at all  Social Connections: Unknown   Frequency of Communication with Friends and Family: Twice a week   Frequency of Social Gatherings with Friends and Family: Twice a week   Attends Religious Services: Never   Marine scientist or Organizations: No   Attends Music therapist: Never   Marital Status: Patient refused  Human resources officer Violence: Not At Risk   Fear of Current or Ex-Partner: No   Emotionally Abused: No   Physically Abused: No   Sexually Abused: No    Family History  Problem Relation Age of Onset   Arthritis Mother    Emphysema Mother    Arthritis Father    COPD Father    Heart disease Father    Diabetes Paternal Aunt        paternal Aunts   Heart disease Paternal Aunt    Thyroid disease Neg Hx     Current Outpatient Medications  Medication Sig Dispense Refill   acetaminophen (TYLENOL) 500 MG tablet Take 500-1,000 mg by mouth  every 8 (eight) hours as needed for mild pain or headache.     ELIQUIS 5 MG TABS tablet TAKE 1 TABLET BY MOUTH TWICE DAILY (Patient taking differently: Take 5 mg by mouth 2 (two) times daily.) 180 tablet 1   metoprolol tartrate (LOPRESSOR) 25 MG tablet TAKE 1/2 TABLET BY MOUTH TWICE DAILY. 180 tablet 3   nitroGLYCERIN (NITROSTAT) 0.4 MG SL tablet Place 0.4 mg under the tongue every 4 (four) hours as needed for chest pain.     rosuvastatin (CRESTOR) 40 MG tablet Take 40 mg by mouth at bedtime.     No current facility-administered medications for this visit.    Allergies  Allergen Reactions   Brilinta [Ticagrelor] Anaphylaxis, Shortness Of Breath and Other (See Comments)    Also, chest tightness   Latex Rash   Tape Rash   Zetia [Ezetimibe]     fatigue     REVIEW OF SYSTEMS:  [X]  denotes positive finding, [ ]  denotes negative finding Cardiac  Comments:  Chest pain or chest pressure:    Shortness of breath upon exertion:    Short of breath when lying flat:    Irregular heart rhythm:        Vascular    Pain in calf, thigh, or hip brought on by ambulation:    Pain in feet at night that wakes you up from your sleep:     Blood clot in your veins:    Leg swelling:         Pulmonary    Oxygen at home:    Productive cough:     Wheezing:         Neurologic    Sudden weakness in arms or legs:     Sudden numbness in arms or legs:     Sudden onset of difficulty speaking or slurred speech:    Temporary loss of vision in one eye:     Problems with dizziness:  Gastrointestinal    Blood in stool:     Vomited blood:         Genitourinary    Burning when urinating:     Blood in urine:        Psychiatric    Major depression:         Hematologic    Bleeding problems:    Problems with blood clotting too easily:        Skin    Rashes or ulcers:        Constitutional    Fever or chills:      PHYSICAL EXAMINATION:  Vitals:   08/04/21 1144  BP: (!) 179/84  Pulse:  73  Resp: 20  Temp: 97.7 F (36.5 C)  TempSrc: Temporal  SpO2: 97%    General:  WDWN in NAD; vital signs documented above Gait: Not observed, in electric wheelchair HENT: WNL, normocephalic Pulmonary: normal non-labored breathing , without wheezing Cardiac: regular HR, without  Murmurs without carotid bruit Abdomen: soft, NT, no masses Vascular Exam/Pulses:2+ radial pulses, 2+ femoral pulses bilaterally Extremities: without ischemic changes, without Gangrene , without cellulitis; without open wounds; left AKA with small superficial scab overlying distal femur. No drainage on compression. It is tender. There is no erythema. Does not appear overtly infected   Musculoskeletal: no muscle wasting or atrophy  Neurologic: A&O X 3;  No focal weakness or paresthesias are detected Psychiatric:  The pt has Normal affect.   ASSESSMENT/PLAN:: 65 y.o. female here for follow up for bilateral AKA's. She presents for referral to [facility] for prosthesis. She has scab on left AKA at this time. No overt infection present  The patient has a bilateral Above Knee Amputation. The patient is well motivated to return to their prior functional status by utilizing a prosthesis to perform ADL's and maintain a healthy lifestyle. The patient has the physical and cognitive capacity to function with a prosthesis.   Functional Level: K2 Limited Community Ambulator: Has the ability or potential for ambulation and to traverse low environmental barriers such as curbs, stairs or uneven surfaces  Residual Limb History: The skin condition of the residual limb is great on the right, she does have superficial scab on left AKA. The patient will continue to monitor the skin of the residual limb and follow hygiene instructions. The patient is experiencing residual limb pain  Prosthetic Prescription Plan: Counseling and education regarding prosthetic management will be provided to the patient via a certified prosthetist. A  multi-discipline team, including physical therapy, will manage the prosthetic fabrication, fitting and prosthetic gait training.   - I told patient I would not do any weight bearing on left AKA until fully healed -Did discuss with patient that if her scab worsens there is concern about it overlying the distal femur and if her bone was involved she would need revision of her AKA. She voices her understanding - Will send prescription for Keflex 500 mg BID x 7 days to her pharmacy - Patient understands if she has concerns about appearance of her left AKA she will call for earlier follow up - I will have her return in 4 weeks for wound check   Karoline Caldwell, PA-C [facility] [phone]  Clinic MD:   Scot Dock

## 2021-08-15 ENCOUNTER — Other Ambulatory Visit: Payer: Self-pay

## 2021-08-15 NOTE — Patient Outreach (Signed)
Aging Gracefully Program  08/15/2021  GITA DILGER 1955/10/03 [id number]  [facility] Evaluation Interviewer attempted to call patient on today regarding Aging Gracefully final evaluation. No answer from patient after multiple rings. CMA unable to leave confidential voicemail for patient to return call.  Will attempt to call back within 1 week.   [facility] Care Management Assistant [phone]

## 2021-08-18 ENCOUNTER — Other Ambulatory Visit: Payer: Self-pay

## 2021-08-18 NOTE — Patient Outreach (Signed)
Aging Gracefully Program  08/18/2021  Mercedes [name] Feb 07, 1956 [id number]  [facility] Evaluation Interviewer made contact with patient. Aging Gracefully 5 month survey completed.   Interviewer will send referral to Rod Holler at [facility] for follow up.  [facility] Care Management Assistant  [phone]

## 2021-08-20 DIAGNOSIS — Z89612 Acquired absence of left leg above knee: Secondary | ICD-10-CM | POA: Diagnosis not present

## 2021-08-20 DIAGNOSIS — I10 Essential (primary) hypertension: Secondary | ICD-10-CM | POA: Diagnosis not present

## 2021-08-20 DIAGNOSIS — Z89611 Acquired absence of right leg above knee: Secondary | ICD-10-CM | POA: Diagnosis not present

## 2021-08-26 ENCOUNTER — Encounter: Payer: Self-pay | Admitting: Internal Medicine

## 2021-08-26 ENCOUNTER — Other Ambulatory Visit: Payer: Self-pay

## 2021-08-26 ENCOUNTER — Ambulatory Visit (INDEPENDENT_AMBULATORY_CARE_PROVIDER_SITE_OTHER): Payer: Medicare Other | Admitting: Internal Medicine

## 2021-08-26 VITALS — BP 170/88 | HR 89 | Ht 65.0 in

## 2021-08-26 DIAGNOSIS — E7800 Pure hypercholesterolemia, unspecified: Secondary | ICD-10-CM | POA: Diagnosis not present

## 2021-08-26 DIAGNOSIS — R001 Bradycardia, unspecified: Secondary | ICD-10-CM | POA: Diagnosis not present

## 2021-08-26 DIAGNOSIS — I10 Essential (primary) hypertension: Secondary | ICD-10-CM

## 2021-08-26 DIAGNOSIS — I2510 Atherosclerotic heart disease of native coronary artery without angina pectoris: Secondary | ICD-10-CM | POA: Diagnosis not present

## 2021-08-26 DIAGNOSIS — I739 Peripheral vascular disease, unspecified: Secondary | ICD-10-CM

## 2021-08-26 DIAGNOSIS — I480 Paroxysmal atrial fibrillation: Secondary | ICD-10-CM

## 2021-08-26 NOTE — Progress Notes (Signed)
PCP: Pincus Sanes, MD   Primary EP: Dr Johney Frame  Mercedes [name] is a 65 y.o. female who presents today for routine electrophysiology followup.  Since last being seen in our clinic, the patient reports doing very well.  Today, she denies symptoms of palpitations, chest pain, shortness of breath,  lower extremity edema, dizziness, presyncope, or syncope.  The patient is otherwise without complaint today.   Past Medical History:  Diagnosis Date   Abnormal LFTs    a. in the past when on Lipitor   Anemia    as a young woman   Anxiety    Arnold-Chiari malformation, type I (HCC)    MRI brain 02/2010   CAD (coronary artery disease)    a. NSTEMI 07/2009: LHC - D2 40%, LAD irreg., EF 50% with apical AK (tako-tsubo CM);  b. Inf STEMI (04/2013): Tx Promus DES to CFX;  c. 08/2013 Lexi CL: No ischemia, dist ant/ap/inf-ap infarct, EF  d. 06/2016 NSTEMI with LM disease: s/p CABG on 07/17/2016 w/ LIMA-LAD, SVG-OM, and SVG-dRCA.   DEPRESSION    Dizziness    ? 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 anticoagulated with Eliquis 10/2014   Presence of permanent cardiac pacemaker 10/2014   leadless permanent pacemaker   RA (rheumatoid arthritis) (HCC)    prior tx by Dr. Dareen Piano   Sjogren's syndrome (HCC) 06/02/13   pt denies this (12/01/13)   SLE (systemic lupus erythematosus) (HCC) dx 05/2013   follows with rheum Dareen Piano)   Tachy-brady syndrome [facility])    a. 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   f/u echo 09/2009: EF 50-55%, mild LVH, mod diast dysfxn, mild apical HK   Wears dentures    Wears glasses     Past Surgical History:  Procedure Laterality Date   ABDOMINAL AORTAGRAM N/A 11/24/2013   Procedure: ABDOMINAL AORTAGRAM WITH BILATERAL RUNOFF WITH POSSIBLE INTERVENTION;  Surgeon: Chuck Hint, MD;  Location: [facility] CATH LAB;  Service: Cardiovascular;  Laterality: N/A;   ABDOMINAL AORTOGRAM W/LOWER EXTREMITY N/A 04/25/2019   Procedure: ABDOMINAL AORTOGRAM W/LOWER EXTREMITY;  Surgeon: Sherren Kerns, MD;  Location: [facility] INVASIVE CV LAB;  Service: Cardiovascular;  Laterality: N/A;   ABDOMINAL AORTOGRAM W/LOWER EXTREMITY Bilateral 05/15/2019   Procedure: ABDOMINAL AORTOGRAM W/LOWER EXTREMITY;  Surgeon: Nada Libman, MD;  Location: [facility] INVASIVE CV LAB;  Service: Cardiovascular;  Laterality: Bilateral;   ABDOMINAL AORTOGRAM W/LOWER EXTREMITY N/A 08/23/2020   Procedure: ABDOMINAL AORTOGRAM W/LOWER EXTREMITY;  Surgeon: Maeola Harman, MD;  Location: [name] INVASIVE CV LAB;  Service: Cardiovascular;  Laterality: N/A;   ABDOMINAL AORTOGRAM W/LOWER EXTREMITY N/A 10/29/2020   Procedure: ABDOMINAL AORTOGRAM W/LOWER EXTREMITY;  Surgeon: Sherren Kerns, MD;  Location: [facility] INVASIVE CV LAB;  Service: Cardiovascular;  Laterality: N/A;   AMPUTATION Left 12/02/2013   Procedure: LEFT 1ST TOE AMPUTATION;  Surgeon: Sherren Kerns, MD;  Location: [facility] OR;  Service: Vascular;  Laterality: Left;   AMPUTATION Left 07/16/2019   Procedure: LEFT TRANSMETATARSAL AMPUTATION;  Surgeon: Nadara Mustard, MD;  Location: [facility] OR;  Service:  Orthopedics;  Laterality: Left;   AMPUTATION Left 10/01/2019   Procedure: LEFT AMPUTATION ABOVE KNEE;  Surgeon: Sherren Kerns, MD;  Location: [facility] OR;  Service: Vascular;  Laterality: Left;   AMPUTATION Right 08/31/2020   Procedure: Right first toe amputation;  Surgeon: Sherren Kerns, MD;  Location: [facility] OR;  Service: Vascular;  Laterality: Right;   AMPUTATION Right 11/23/2020   Procedure: RIGHT AMPUTATION ABOVE KNEE;  Surgeon: Sherren Kerns, MD;  Location: [facility] OR;  Service:  Vascular;  Laterality: Right;   APPLICATION OF WOUND VAC Left 11/03/2019   Procedure: Application Of Wound Vac, left lower extremity;  Surgeon: Sherren Kerns, MD;  Location: [facility] OR;  Service: Vascular;  Laterality: Left;   CARDIAC CATHETERIZATION N/A 07/12/2016   Procedure: Left Heart Cath and Coronary Angiography;  Surgeon: Lennette Bihari, MD;  Location: [facility] INVASIVE CV LAB;  Service: Cardiovascular;  Laterality: N/A;   CARDIAC CATHETERIZATION N/A 07/14/2016   Procedure: Intravascular Ultrasound/IVUS;  Surgeon: Yvonne Kendall, MD;  Location: [facility] INVASIVE CV LAB;  Service: Cardiovascular;  Laterality: N/A;   COLONOSCOPY     CORONARY ANGIOPLASTY  04/2013   CORONARY ARTERY BYPASS GRAFT N/A 07/17/2016   Procedure: CORONARY ARTERY BYPASS GRAFTING (CABG)x3 using right greater saphenous vein harvested endoscopiclly and left internal mammary artery;  Surgeon: Kerin Perna, MD;  Location: [facility] OR;  Service: Open Heart Surgery;  Laterality: N/A;   ENDARTERECTOMY FEMORAL Left 05/12/2019   Procedure: LEFT Femoral Endarterectomy;  Surgeon: Sherren Kerns, MD;  Location: [facility] OR;  Service: Vascular;  Laterality: Left;   FEMORAL-POPLITEAL BYPASS GRAFT Left 12/02/2013   Procedure:  LEFT FEMORAL-BELOW KNEE POPLITEAL ARTERY BYPASS GRAFT;  Surgeon: Sherren Kerns, MD;  Location: [facility] OR;  Service: Vascular;  Laterality: Left;   FEMORAL-POPLITEAL BYPASS GRAFT Left 09/25/2019   Procedure: THROMBECTOMY and PROPATEN BYPASS  FEMORAL TO BELOW KNEE POPLITEAL ARTERY BYPASS GRAFT LEFT LEG;  Surgeon: Larina Earthly, MD;  Location: [facility] OR;  Service: Vascular;  Laterality: Left;   FEMORAL-POPLITEAL BYPASS GRAFT Right 08/24/2020   Procedure: RIGHT FEMORAL-BELOW KNEE POPLITEAL ARTERY BYPASS USING GORE PROPATEN GRAFT;  Surgeon: Maeola Harman, MD;  Location: [facility] OR;  Service: Vascular;  Laterality: Right;   FEMORAL-POPLITEAL BYPASS GRAFT Right 11/01/2020   Procedure: Thrombectomy and Revision of Right Femoral to below knee  Bypass with Interpostional graft to Tibial / peroneal Trunk and Endarterectomy Tibial Weston Settle Trunk.;  Surgeon: Sherren Kerns, MD;  Location: [facility] OR;  Service: Vascular;  Laterality: Right;   INTRAOPERATIVE ARTERIOGRAM Right 11/01/2020   Procedure: ANTEGRADE RIGHT LOWER EXTREMITY INTRA OPERATIVE ARTERIOGRAM;  Surgeon: Sherren Kerns, MD;  Location: [facility] OR;  Service: Vascular;  Laterality: Right;   LEFT HEART CATHETERIZATION WITH CORONARY ANGIOGRAM N/A 05/19/2013   Procedure: LEFT HEART CATHETERIZATION WITH CORONARY ANGIOGRAM;  Surgeon: Micheline Chapman, MD;  [name] C[facility] LAB;  Service: Cardiovascular;  Laterality: N/A;   LEFT HEART CATHETERIZATION WITH CORONARY ANGIOGRAM N/A 10/28/2014   Procedure: LEFT HEART CATHETERIZATION WITH CORONARY ANGIOGRAM;  Surgeon: Iran Ouch, MD;  Location: [facility] CATH LAB;  Service: Cardiovascular;  Laterality: N/A;   LOWER EXTREMITY ANGIOGRAM Left 05/12/2019   Procedure: Lower Extremity Angiogram;  Surgeon: Sherren Kerns, MD;  Location: [facility] OR;  Service: Vascular;  Laterality: Left;   PATCH ANGIOPLASTY Left 05/12/2019   Procedure: Left Femoral Patch Angioplasty USING CADAVERIC SAPHENOUS VEIN;  Surgeon: Sherren Kerns, MD;  Location: [facility] OR;  Service: Vascular;  Laterality: Left;   PERCUTANEOUS CORONARY  STENT INTERVENTION (PCI-S)  05/19/2013   Procedure: PERCUTANEOUS CORONARY STENT INTERVENTION (PCI-S);  Surgeon: Micheline Chapman, MD;  Location[facility] C[facility] LAB;  Service: Cardiovascular;;   PERIPHERAL VASCULAR INTERVENTION Right 10/29/2020   Procedure: PERIPHERAL VASCULAR INTERVENTION;  Surgeon: Sherren Kerns, MD;  Location: [facility] INVASIVE CV LAB;  Service: Cardiovascular;  Laterality: Right;  common iliac   PERMANENT PACEMAKER INSERTION N/A 11/04/2014   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   TEE WITHOUT CARDIOVERSION N/A 07/17/2016   Procedure: TRANSESOPHAGEAL ECHOCARDIOGRAM (TEE);  Surgeon: Kerin Perna, MD;  Location: [facility] OR;  Service:  Open Heart Surgery;  Laterality: N/A;   THROMBECTOMY FEMORAL ARTERY Left 05/12/2019   Procedure: Left Ilio-Femoral Artery and Femoral-popliteal Graft Thrombectomy;  Surgeon: Sherren Kerns, MD;  Location: [facility] OR;  Service: Vascular;  Laterality: Left;   THROMBECTOMY OF BYPASS GRAFT FEMORAL- POPLITEAL ARTERY Right 11/01/2020   Procedure: POSSIBLE THROMBOLYSIS VERSUS OPEN THROMBECTOMY AND REVSION OF RIGHT FEMORAL-POPLITEAL ARTERY BYPASS;  Surgeon: Sherren Kerns, MD;  Location: [facility] OR;  Service: Vascular;  Laterality: Right;   TUBAL LIGATION     VISCERAL ANGIOGRAPHY N/A 04/25/2019   Procedure: MESENTRIC  ANGIOGRAPHY;  Surgeon: Sherren Kerns, MD;  Location: [facility] INVASIVE CV LAB;  Service: Cardiovascular;  Laterality: N/A;   WOUND DEBRIDEMENT Left 11/03/2019   Procedure: Debridement Wound of left above the knee amputation;  Surgeon: Sherren Kerns, MD;  Location: [facility] OR;  Service: Vascular;  Laterality: Left;    ROS- all systems are reviewed and negatives except as per HPI above  Current Outpatient Medications  Medication Sig Dispense Refill   acetaminophen (TYLENOL) 500 MG tablet Take 500-1,000 mg by mouth every 8 (eight) hours as needed for mild pain or headache.     cephALEXin (KEFLEX) 500 MG capsule Take 1 capsule (500 mg total) by mouth 2 (two) times daily. 14 capsule 0   ELIQUIS 5 MG TABS tablet TAKE 1 TABLET BY MOUTH TWICE DAILY (Patient taking differently: Take 5 mg by mouth 2 (two) times daily.) 180 tablet 1   metoprolol tartrate (LOPRESSOR) 25 MG tablet TAKE 1/2 TABLET BY MOUTH TWICE DAILY. 180 tablet 3   nitroGLYCERIN (NITROSTAT) 0.4 MG SL tablet Place 0.4 mg under the tongue every 4 (four) hours as needed for chest pain.     rosuvastatin (CRESTOR) 40 MG tablet Take 40 mg by mouth at bedtime.     No current facility-administered medications for this visit.    Physical Exam: Vitals:   08/26/21 1530  BP: (!) 170/88  Pulse: 89  SpO2: 95%  Height: 5\' 5"  (1.651 m)    GEN- The  patient is well appearing, alert and oriented x 3 today.  In a wheelchair today Head- normocephalic, atraumatic Eyes-  Sclera clear, conjunctiva pink Ears- hearing intact Oropharynx- clear Lungs-  normal work of breathing Heart- Regular rate and rhythm  GI- soft  Extremities- s/p bilateral aka  Wt Readings from Last 3 Encounters:  01/10/21 92 lb (41.7 kg)  11/22/20 103 lb 2.8 oz (46.8 kg)  11/18/20 89 lb (40.4 kg)    EKG tracing ordered today is personally reviewed and shows sinus rhythm  Assessment and Plan:  Bradycardia Asymptomatic Her leadless pacemaker is no longer functional.  She has declined device removal or replacement but has done well over the past 3 years without pacing.  2. Paroxysmal atrial fibrillation Well controlled She is on eliquis  3. HTN Stable No change required today Labs 7/22 reviewed  4. PAD S/p bilateral AKA  5. CAD S/p CABG No ischemic symptoms  Risks, benefits and potential toxicities for medications prescribed and/or refilled reviewed with patient today.   Return to see EP APP in a year Follow-up with Dr Jens Som in 6 months  Hillis Range MD, [facility] 08/26/2021 3:36 PM

## 2021-08-26 NOTE — Patient Instructions (Addendum)
Medication Instructions:  Your physician recommends that you continue on your current medications as directed. Please refer to the Current Medication list given to you today. *If you need a refill on your cardiac medications before your next appointment, please call your pharmacy*  Lab Work: None. If you have labs (blood work) drawn today and your tests are completely normal, you will receive your results only by: MyChart Message (if you have MyChart) OR A paper copy in the mail If you have any lab test that is abnormal or we need to change your treatment, we will call you to review the results.  Testing/Procedures: None.  Follow-Up: At [facility], you and your health needs are our priority.  As part of our continuing mission to provide you with exceptional heart care, we have created designated Provider Care Teams.  These Care Teams include your primary Cardiologist (physician) and Advanced Practice Providers (APPs -  Physician Assistants and Nurse Practitioners) who all work together to provide you with the care you need, when you need it.  Your physician wants you to follow-up in: 6 months with [place].  12 months with one of the following Advanced Practice Providers on your designated Care Team:    Francis Dowse, PA-C    You will receive a reminder letter in the mail two months in advance. If you don't receive a letter, please call our office to schedule the follow-up appointment.  We recommend signing up for the patient portal called "MyChart".  Sign up information is provided on this After Visit Summary.  MyChart is used to connect with patients for Virtual Visits (Telemedicine).  Patients are able to view lab/test results, encounter notes, upcoming appointments, etc.  Non-urgent messages can be sent to your provider as well.   To learn more about what you can do with MyChart, go to [email or web address].    Any Other Special Instructions Will Be Listed Below (If  Applicable).

## 2021-08-29 DIAGNOSIS — Z4781 Encounter for orthopedic aftercare following surgical amputation: Secondary | ICD-10-CM | POA: Diagnosis not present

## 2021-08-29 DIAGNOSIS — I739 Peripheral vascular disease, unspecified: Secondary | ICD-10-CM | POA: Diagnosis not present

## 2021-09-07 ENCOUNTER — Ambulatory Visit: Payer: Medicare Other

## 2021-09-20 DIAGNOSIS — Z89612 Acquired absence of left leg above knee: Secondary | ICD-10-CM | POA: Diagnosis not present

## 2021-09-20 DIAGNOSIS — Z89611 Acquired absence of right leg above knee: Secondary | ICD-10-CM | POA: Diagnosis not present

## 2021-09-20 DIAGNOSIS — I10 Essential (primary) hypertension: Secondary | ICD-10-CM | POA: Diagnosis not present

## 2021-09-23 ENCOUNTER — Telehealth: Payer: Medicare Other

## 2021-09-29 DIAGNOSIS — Z4781 Encounter for orthopedic aftercare following surgical amputation: Secondary | ICD-10-CM | POA: Diagnosis not present

## 2021-09-29 DIAGNOSIS — I739 Peripheral vascular disease, unspecified: Secondary | ICD-10-CM | POA: Diagnosis not present

## 2021-10-21 DIAGNOSIS — Z89611 Acquired absence of right leg above knee: Secondary | ICD-10-CM | POA: Diagnosis not present

## 2021-10-21 DIAGNOSIS — Z89612 Acquired absence of left leg above knee: Secondary | ICD-10-CM | POA: Diagnosis not present

## 2021-10-21 DIAGNOSIS — I10 Essential (primary) hypertension: Secondary | ICD-10-CM | POA: Diagnosis not present

## 2021-10-27 DIAGNOSIS — I739 Peripheral vascular disease, unspecified: Secondary | ICD-10-CM | POA: Diagnosis not present

## 2021-10-27 DIAGNOSIS — Z4781 Encounter for orthopedic aftercare following surgical amputation: Secondary | ICD-10-CM | POA: Diagnosis not present

## 2021-11-07 NOTE — Progress Notes (Signed)
Subjective:    Patient ID: Mercedes [name], female    DOB: 16-Aug-1956, 66 y.o.   MRN: [id number]  This visit occurred during the SARS-CoV-2 public health emergency.  Safety protocols were in place, including screening questions prior to the visit, additional usage of staff PPE, and extensive cleaning of exam room while observing appropriate contact time as indicated for disinfecting solutions.    HPI Mercedes [name] is here for  Chief Complaint  Patient presents with   pressure sore    Pressure sore on bottom and leg    Sore on leg - left stump.  She has had this for at least 3 months.  She did see vascular back in December and at that time they did advise her to follow-up with them after a month, which she does not recall and has not seen them since.  The area where the scab is is overlying the bone.  She did recall hitting the left stump a while ago, but not sure exactly why she has the sore there.  At one point it did bleed and then scab over again and that has happened a couple of times.  It is not going away.  She does plan on starting physical therapy to help use her new prosthesis soon.  She did have positive couple different times, out of the area.  The last time she recalls seeing any pus was 2/18.  She denies any active or recent bleeding or discharge.  She denies any fevers or chills.   B/l leg pain when laying down in bed but can lay in recliner and be ok.    She also has a sore on her left buttock.  It is painful.  She denies any discharge.  She has only had it for the past 3-4 days.  She is not able to see it and not exactly sure what it looks like.  She needs referral for physical therapy  Medications and allergies reviewed with patient and updated if appropriate.  Current Outpatient Medications on File Prior to Visit  Medication Sig Dispense Refill   acetaminophen (TYLENOL) 500 MG tablet Take 500-1,000 mg by mouth every 8 (eight) hours as needed for mild pain or headache.      ELIQUIS 5 MG TABS tablet TAKE 1 TABLET BY MOUTH TWICE DAILY (Patient taking differently: Take 5 mg by mouth 2 (two) times daily.) 180 tablet 1   metoprolol tartrate (LOPRESSOR) 25 MG tablet TAKE 1/2 TABLET BY MOUTH TWICE DAILY. 180 tablet 3   nitroGLYCERIN (NITROSTAT) 0.4 MG SL tablet Place 0.4 mg under the tongue every 4 (four) hours as needed for chest pain.     rosuvastatin (CRESTOR) 40 MG tablet Take 40 mg by mouth at bedtime.     No current facility-administered medications on file prior to visit.    Review of Systems  Constitutional:  Negative for chills and fever.  Skin:  Positive for wound.      Objective:   Vitals:   11/08/21 1009  BP: (!) 164/100  Pulse: 90  Temp: 98 F (36.7 C)  SpO2: 99%   BP Readings from Last 3 Encounters:  11/08/21 (!) 164/100  08/26/21 (!) 170/88  08/04/21 (!) 179/84   Wt Readings from Last 3 Encounters:  01/10/21 92 lb (41.7 kg)  11/22/20 103 lb 2.8 oz (46.8 kg)  11/18/20 89 lb (40.4 kg)   Body mass index is 15.31 kg/m.    Physical Exam Constitutional:  Appearance: Normal appearance.  HENT:     Head: Normocephalic.  Skin:    General: Skin is warm and dry.     Findings: Erythema (Mild erythema lateral left buttock that is tender-no fluctuance or induration-concern for pressure sore-stage I) and lesion (Scabbed ulcer left anterior stump overlying end of femur.  Slightly tender, minimal erythema surrounding.  No active discharge or bleeding, but with pressure I was able to express some clear fluid) present.  Neurological:     Mental Status: She is alert. Mental status is at baseline.           Assessment & Plan:    See Problem List for Assessment and Plan of chronic medical problems.

## 2021-11-08 ENCOUNTER — Encounter: Payer: Self-pay | Admitting: Internal Medicine

## 2021-11-08 ENCOUNTER — Ambulatory Visit (INDEPENDENT_AMBULATORY_CARE_PROVIDER_SITE_OTHER): Payer: Medicare Other | Admitting: Internal Medicine

## 2021-11-08 ENCOUNTER — Other Ambulatory Visit: Payer: Self-pay

## 2021-11-08 VITALS — BP 164/100 | HR 90 | Temp 98.0°F | Ht 65.0 in

## 2021-11-08 DIAGNOSIS — Z89612 Acquired absence of left leg above knee: Secondary | ICD-10-CM

## 2021-11-08 DIAGNOSIS — T8789 Other complications of amputation stump: Secondary | ICD-10-CM | POA: Diagnosis not present

## 2021-11-08 DIAGNOSIS — L97109 Non-pressure chronic ulcer of unspecified thigh with unspecified severity: Secondary | ICD-10-CM | POA: Diagnosis not present

## 2021-11-08 DIAGNOSIS — Z89611 Acquired absence of right leg above knee: Secondary | ICD-10-CM

## 2021-11-08 DIAGNOSIS — L89321 Pressure ulcer of left buttock, stage 1: Secondary | ICD-10-CM

## 2021-11-08 MED ORDER — CEPHALEXIN 500 MG PO CAPS: 500.0000 mg | ORAL_CAPSULE | Freq: Two times a day (BID) | ORAL | 0 refills | Status: DC

## 2021-11-08 NOTE — Patient Instructions (Addendum)
? ?  I will order a referral for PT.  I will see if we can get you something softer for the wheelchair.   ?  ? ? ?Medications changes include :   keflex as prescribed ? ? ?Your prescription(s) have been sent to your pharmacy.  ? ? ?Follow up with vascular  ? ?

## 2021-11-09 ENCOUNTER — Encounter: Payer: Self-pay | Admitting: Internal Medicine

## 2021-11-09 DIAGNOSIS — L97109 Non-pressure chronic ulcer of unspecified thigh with unspecified severity: Secondary | ICD-10-CM | POA: Insufficient documentation

## 2021-11-09 DIAGNOSIS — L89321 Pressure ulcer of left buttock, stage 1: Secondary | ICD-10-CM | POA: Insufficient documentation

## 2021-11-09 NOTE — Assessment & Plan Note (Signed)
Subacute ?She has had this area for at least 3 months ?I was able to express some clear fluid-no obvious pus ?Symptoms not ?I am concerned about the possibility of an infection ?We will start Keflex 500 mg twice daily x7 days ?Advise follow-up with vascular ?Discussed when she starts using the prosthesis she is going to have to be careful that this area does not get rubbed and has extra cushioning to prevent it from getting worse ?

## 2021-11-09 NOTE — Assessment & Plan Note (Signed)
To start using new prosthetics ?Referral ordered for physical therapy for gait training ?

## 2021-11-09 NOTE — Assessment & Plan Note (Signed)
Acute ?3-4 days of soreness in the left buttock region-area of redness is consistent with possible pressure sore ?Stage I ?Discussed trying to keep pressure off the area with frequent changes in position and shifting weight ?She is sitting most of the day-this may change when she starts doing physical therapy ?Will order a gel cushion for her wheelchair to help prevent pressure sores ? ?

## 2021-11-10 ENCOUNTER — Telehealth: Payer: Self-pay

## 2021-11-10 NOTE — Telephone Encounter (Signed)
Left message for patient to return call to clinic to let me know where she wanted her gel cushion sent to. If she does not return call by Monday I will send to [employer].  ?

## 2021-11-16 NOTE — Telephone Encounter (Signed)
Pt requesting a cb w/ status update regarding the gel cushion ?

## 2021-11-17 NOTE — Telephone Encounter (Signed)
Spoke with patient today. ? ?Reached out to [facility] with [employer] and message sent to her to contact patient. ?

## 2021-11-18 DIAGNOSIS — Z89612 Acquired absence of left leg above knee: Secondary | ICD-10-CM | POA: Diagnosis not present

## 2021-11-18 DIAGNOSIS — I10 Essential (primary) hypertension: Secondary | ICD-10-CM | POA: Diagnosis not present

## 2021-11-18 DIAGNOSIS — Z89611 Acquired absence of right leg above knee: Secondary | ICD-10-CM | POA: Diagnosis not present

## 2021-11-21 ENCOUNTER — Emergency Department (HOSPITAL_COMMUNITY): Payer: Medicare Other

## 2021-11-21 ENCOUNTER — Other Ambulatory Visit: Payer: Self-pay

## 2021-11-21 ENCOUNTER — Inpatient Hospital Stay (HOSPITAL_COMMUNITY): Payer: Medicare Other

## 2021-11-21 ENCOUNTER — Observation Stay (HOSPITAL_COMMUNITY)
Admission: EM | Admit: 2021-11-21 | Discharge: 2021-11-24 | Disposition: A | Discharge status: Home / Self Care | Payer: Medicare Other | Attending: Family Medicine | Admitting: Family Medicine

## 2021-11-21 DIAGNOSIS — I2510 Atherosclerotic heart disease of native coronary artery without angina pectoris: Secondary | ICD-10-CM | POA: Insufficient documentation

## 2021-11-21 DIAGNOSIS — I480 Paroxysmal atrial fibrillation: Secondary | ICD-10-CM | POA: Diagnosis not present

## 2021-11-21 DIAGNOSIS — E785 Hyperlipidemia, unspecified: Secondary | ICD-10-CM

## 2021-11-21 DIAGNOSIS — R4701 Aphasia: Secondary | ICD-10-CM | POA: Diagnosis present

## 2021-11-21 DIAGNOSIS — I639 Cerebral infarction, unspecified: Principal | ICD-10-CM | POA: Diagnosis present

## 2021-11-21 DIAGNOSIS — R069 Unspecified abnormalities of breathing: Secondary | ICD-10-CM | POA: Diagnosis not present

## 2021-11-21 DIAGNOSIS — Z951 Presence of aortocoronary bypass graft: Secondary | ICD-10-CM | POA: Diagnosis not present

## 2021-11-21 DIAGNOSIS — Z87891 Personal history of nicotine dependence: Secondary | ICD-10-CM | POA: Insufficient documentation

## 2021-11-21 DIAGNOSIS — Z89612 Acquired absence of left leg above knee: Secondary | ICD-10-CM

## 2021-11-21 DIAGNOSIS — I6523 Occlusion and stenosis of bilateral carotid arteries: Secondary | ICD-10-CM | POA: Diagnosis not present

## 2021-11-21 DIAGNOSIS — Z79899 Other long term (current) drug therapy: Secondary | ICD-10-CM | POA: Insufficient documentation

## 2021-11-21 DIAGNOSIS — R531 Weakness: Secondary | ICD-10-CM | POA: Diagnosis not present

## 2021-11-21 DIAGNOSIS — R29898 Other symptoms and signs involving the musculoskeletal system: Secondary | ICD-10-CM | POA: Diagnosis not present

## 2021-11-21 DIAGNOSIS — Z91040 Latex allergy status: Secondary | ICD-10-CM | POA: Insufficient documentation

## 2021-11-21 DIAGNOSIS — Z7902 Long term (current) use of antithrombotics/antiplatelets: Secondary | ICD-10-CM | POA: Diagnosis not present

## 2021-11-21 DIAGNOSIS — E1169 Type 2 diabetes mellitus with other specified complication: Secondary | ICD-10-CM | POA: Diagnosis present

## 2021-11-21 DIAGNOSIS — R29818 Other symptoms and signs involving the nervous system: Secondary | ICD-10-CM | POA: Diagnosis not present

## 2021-11-21 DIAGNOSIS — R6889 Other general symptoms and signs: Secondary | ICD-10-CM | POA: Diagnosis not present

## 2021-11-21 DIAGNOSIS — R4781 Slurred speech: Secondary | ICD-10-CM | POA: Diagnosis not present

## 2021-11-21 DIAGNOSIS — I10 Essential (primary) hypertension: Secondary | ICD-10-CM | POA: Diagnosis not present

## 2021-11-21 DIAGNOSIS — G459 Transient cerebral ischemic attack, unspecified: Secondary | ICD-10-CM | POA: Diagnosis not present

## 2021-11-21 DIAGNOSIS — Z950 Presence of cardiac pacemaker: Secondary | ICD-10-CM | POA: Insufficient documentation

## 2021-11-21 DIAGNOSIS — Z743 Need for continuous supervision: Secondary | ICD-10-CM | POA: Diagnosis not present

## 2021-11-21 DIAGNOSIS — I152 Hypertension secondary to endocrine disorders: Secondary | ICD-10-CM | POA: Diagnosis present

## 2021-11-21 LAB — CBC WITH DIFFERENTIAL/PLATELET
Abs Immature Granulocytes: 0.02 10*3/uL (ref 0.00–0.07)
Basophils Absolute: 0.1 10*3/uL (ref 0.0–0.1)
Basophils Relative: 1 %
Eosinophils Absolute: 0.1 10*3/uL (ref 0.0–0.5)
Eosinophils Relative: 3 %
HCT: 41.7 % (ref 36.0–46.0)
Hemoglobin: 12.6 g/dL (ref 12.0–15.0)
Immature Granulocytes: 0 %
Lymphocytes Relative: 33 %
Lymphs Abs: 1.9 10*3/uL (ref 0.7–4.0)
MCH: 22.9 pg — ABNORMAL LOW (ref 26.0–34.0)
MCHC: 30.2 g/dL (ref 30.0–36.0)
MCV: 75.8 fL — ABNORMAL LOW (ref 80.0–100.0)
Monocytes Absolute: 0.5 10*3/uL (ref 0.1–1.0)
Monocytes Relative: 8 %
Neutro Abs: 3.1 10*3/uL (ref 1.7–7.7)
Neutrophils Relative %: 55 %
Platelets: 497 10*3/uL — ABNORMAL HIGH (ref 150–400)
RBC: 5.5 MIL/uL — ABNORMAL HIGH (ref 3.87–5.11)
RDW: 17.2 % — ABNORMAL HIGH (ref 11.5–15.5)
WBC: 5.6 10*3/uL (ref 4.0–10.5)
nRBC: 0 % (ref 0.0–0.2)

## 2021-11-21 LAB — COMPREHENSIVE METABOLIC PANEL
ALT: 9 U/L (ref 0–44)
AST: 14 U/L — ABNORMAL LOW (ref 15–41)
Albumin: 2.9 g/dL — ABNORMAL LOW (ref 3.5–5.0)
Alkaline Phosphatase: 83 U/L (ref 38–126)
Anion gap: 7 (ref 5–15)
BUN: 13 mg/dL (ref 8–23)
CO2: 23 mmol/L (ref 22–32)
Calcium: 9.2 mg/dL (ref 8.9–10.3)
Chloride: 105 mmol/L (ref 98–111)
Creatinine, Ser: 0.78 mg/dL (ref 0.44–1.00)
GFR, Estimated: >60 mL/min (ref >60)
Glucose, Bld: 112 mg/dL — ABNORMAL HIGH (ref 70–99)
Potassium: 4 mmol/L (ref 3.5–5.1)
Sodium: 135 mmol/L (ref 135–145)
Total Bilirubin: 0.2 mg/dL — ABNORMAL LOW (ref 0.3–1.2)
Total Protein: 7.6 g/dL (ref 6.5–8.1)

## 2021-11-21 LAB — APTT: aPTT: 27 seconds (ref 24–36)

## 2021-11-21 LAB — URINALYSIS, ROUTINE W REFLEX MICROSCOPIC
Bacteria, UA: NONE SEEN
Bilirubin Urine: NEGATIVE
Glucose, UA: NEGATIVE mg/dL
Hgb urine dipstick: NEGATIVE
Ketones, ur: NEGATIVE mg/dL
Nitrite: NEGATIVE
Protein, ur: NEGATIVE mg/dL
Specific Gravity, Urine: 1.016 (ref 1.005–1.030)
pH: 6 (ref 5.0–8.0)

## 2021-11-21 LAB — PROTIME-INR
INR: 1 (ref 0.8–1.2)
Prothrombin Time: 13.6 seconds (ref 11.4–15.2)

## 2021-11-21 LAB — RAPID URINE DRUG SCREEN, HOSP PERFORMED
Amphetamines: NOT DETECTED
Barbiturates: NOT DETECTED
Benzodiazepines: NOT DETECTED
Cocaine: NOT DETECTED
Opiates: NOT DETECTED
Tetrahydrocannabinol: NOT DETECTED

## 2021-11-21 LAB — ETHANOL: Alcohol, Ethyl (B): <10 mg/dL (ref <10)

## 2021-11-21 MED ORDER — ACETAMINOPHEN 650 MG RE SUPP: 650.0000 mg | Freq: Four times a day (QID) | RECTAL | Status: DC | PRN

## 2021-11-21 MED ORDER — ASPIRIN 325 MG PO TABS: 325.0000 mg | ORAL_TABLET | Freq: Every day | ORAL | Status: DC

## 2021-11-21 MED ORDER — IOHEXOL 350 MG/ML SOLN
75.0000 mL | Freq: Once | INTRAVENOUS | Status: AC | PRN
  Administered 2021-11-21: 75 mL via INTRAVENOUS

## 2021-11-21 MED ORDER — STROKE: EARLY STAGES OF RECOVERY BOOK
Freq: Once | Status: AC
  Filled 2021-11-21: qty 1

## 2021-11-21 MED ORDER — ACETAMINOPHEN 325 MG PO TABS: 650.0000 mg | ORAL_TABLET | Freq: Four times a day (QID) | ORAL | Status: DC | PRN

## 2021-11-21 MED ORDER — ENOXAPARIN SODIUM 40 MG/0.4ML IJ SOSY: 40.0000 mg | PREFILLED_SYRINGE | Freq: Every day | INTRAMUSCULAR | Status: DC

## 2021-11-21 NOTE — ED Provider Notes (Addendum)
?[facility] ?Provider Note ? ? ?CSN: [id number] ?Arrival date & time: 11/21/21  2054 ? ?  ? ?History ? ?Chief Complaint  ?Patient presents with  ? Aphasia  ? ? ?Mercedes [name] is a 66 y.o. female. ? ?66 y.o female with a PMH of PAD, Takotsubo, lateral AKA presents to the ED via EMS with a chief complaint of aphasia.  According to patient who is able to provide minimal history, she reports symptoms began approximately 12 days ago, when she was waking up from taking a nap.  She began to feel a sharp pain to the entire aspect of her head without any radiation.  Afterwards she felt like she was unable to fully sleep.  Later she discovered that she was unable to fully grasp objects with the right hand.  She did not seek evaluation at the time of this incident and thought this would likely improve.  She noticed that her speech changes would wax and wane over the next couple of days.  On today's visit she noted worsening weakness to the right upper extremity. She also felt she was unable to transfer from her wheelchair onto the bed. States on march 15, she laid in bed most of the day. She denies any chest pain, shortness of breath, or trauma.  ? ?The history is provided by the patient and medical records.  ? ?  ? ?Home Medications ?Prior to Admission medications   ?Medication Sig Start Date End Date Taking? Authorizing Provider  ?acetaminophen (TYLENOL) 500 MG tablet Take 500-1,000 mg by mouth every 8 (eight) hours as needed for mild pain or headache.    [provider]  ?cephALEXin (KEFLEX) 500 MG capsule Take 1 capsule (500 mg total) by mouth 2 (two) times daily. 11/08/21   Burns, Claudina Lick, MD  ?ELIQUIS 5 MG TABS tablet TAKE 1 TABLET BY MOUTH TWICE DAILY ?Patient taking differently: Take 5 mg by mouth 2 (two) times daily. 07/12/20   Allred, Jeneen Rinks, MD  ?metoprolol tartrate (LOPRESSOR) 25 MG tablet TAKE 1/2 TABLET BY MOUTH TWICE DAILY. 03/09/21   Allred, Jeneen Rinks, MD   ?nitroGLYCERIN (NITROSTAT) 0.4 MG SL tablet Place 0.4 mg under the tongue every 4 (four) hours as needed for chest pain. 06/25/20   [provider]  ?rosuvastatin (CRESTOR) 40 MG tablet Take 40 mg by mouth at bedtime.    [provider]  ?   ? ?Allergies    ?Brilinta [ticagrelor], Latex, Tape, and Zetia [ezetimibe]   ? ?Review of Systems   ?Review of Systems  ?Constitutional:  Negative for chills and fever.  ?HENT:  Negative for sore throat.   ?Respiratory:  Negative for shortness of breath.   ?Cardiovascular:  Negative for chest pain.  ?Gastrointestinal:  Negative for abdominal pain and vomiting.  ?Genitourinary:  Negative for flank pain.  ?Skin:  Positive for wound. Negative for pallor.  ?Neurological:  Positive for dizziness and headaches.  ?All other systems reviewed and are negative. ? ?Physical Exam ?Updated Vital Signs ?BP 133/79   Pulse 68   Temp 99.2 ?F (37.3 ?C) (Oral)   Resp 12   SpO2 95%  ?Physical Exam ?Vitals and nursing note reviewed.  ?Constitutional:   ?   General: She is not in acute distress. ?   Appearance: She is not ill-appearing.  ?HENT:  ?   Head: Normocephalic and atraumatic.  ?   Nose: Nose normal.  ?   Mouth/Throat:  ?   Mouth: Mucous membranes are  moist.  ?Cardiovascular:  ?   Rate and Rhythm: Normal rate.  ?Pulmonary:  ?   Effort: Pulmonary effort is normal.  ?   Breath sounds: No wheezing or rales.  ?Abdominal:  ?   General: Abdomen is flat.  ?Musculoskeletal:  ?   Cervical back: Normal range of motion and neck supple.  ?Skin: ?   General: Skin is warm and dry.  ?Neurological:  ?   Mental Status: She is alert and oriented to person, place, and time.  ?   Comments: Alert, oriented, thought content appropriate. Speech is aphasic. Able to follow 2 step commands slowly. ?Cranial Nerves:  ?II:  Peripheral visual fields grossly normal, pupils, round, reactive to light ?III,IV, VI: ptosis not present, extra-ocular motions intact bilaterally  ?V,VII: smile symmetric,  facial light touch sensation equal ?VIII: hearing grossly normal bilaterally  ?IX,X: midline uvula rise  ?XI: bilateral shoulder shrug equal and strong ?XII: midline tongue deviated to the right side ?Motor:  ?3/5 with RIGHT upper extremity grip.  ?5/5 in LEFT upper and lower extremities bilaterally including strong and equal grip strength and dorsiflexion/plantar flexion ?Sensory: light touch normal in all extremities.  ?Cerebellar: normal finger-to-nose with bilateral upper extremities, pronator drift negative ?Gait: wheelchair bound  ? ?  ? ? ?ED Results / Procedures / Treatments   ?Labs ?(all labs ordered are listed, but only abnormal results are displayed) ?Labs Reviewed  ?CBC WITH DIFFERENTIAL/PLATELET - Abnormal; Notable for the following components:  ?    Result Value  ? RBC 5.50 (*)   ? MCV 75.8 (*)   ? MCH 22.9 (*)   ? RDW 17.2 (*)   ? Platelets 497 (*)   ? All other components within normal limits  ?COMPREHENSIVE METABOLIC PANEL - Abnormal; Notable for the following components:  ? Glucose, Bld 112 (*)   ? Albumin 2.9 (*)   ? AST 14 (*)   ? Total Bilirubin 0.2 (*)   ? All other components within normal limits  ?URINALYSIS, ROUTINE W REFLEX MICROSCOPIC - Abnormal; Notable for the following components:  ? Leukocytes,Ua TRACE (*)   ? All other components within normal limits  ?RESP PANEL BY RT-PCR (FLU A&B, COVID) ARPGX2  ?ETHANOL  ?PROTIME-INR  ?APTT  ?RAPID URINE DRUG SCREEN, HOSP PERFORMED  ?CBC  ?DIFFERENTIAL  ? ? ?EKG ?None ? ?Radiology ?CT HEAD CODE STROKE WO CONTRAST ? ?Result Date: 11/21/2021 ?CLINICAL DATA:  Initial evaluation for neuro deficit, stroke suspected. EXAM: CT HEAD WITHOUT CONTRAST TECHNIQUE: Contiguous axial images were obtained from the base of the skull through the vertex without intravenous contrast. RADIATION DOSE REDUCTION: This exam was performed according to the departmental dose-optimization program which includes automated exposure control, adjustment of the mA and/or kV  according to patient size and/or use of iterative reconstruction technique. COMPARISON:  Prior MRI from 10/29/2014. FINDINGS: Brain: Mild age-related cerebral atrophy with chronic small vessel ischemic disease, progressed from prior. 1.5 cm hypodensity involving the left frontal centrum semi ovale noted, somewhat age indeterminate, but favored to be chronic. No other acute large vessel territory infarct. No intracranial hemorrhage. No mass lesion or midline shift. No hydrocephalus or extra-axial fluid collection. Vascular: No hyperdense vessel. Scattered vascular calcifications noted within the carotid siphons. Skull: Scalp soft tissues within normal limits.  Calvarium intact. Sinuses/Orbits: Globes and orbital soft tissues within normal limits. Chronic right sphenoid sinusitis noted. Paranasal sinuses are otherwise clear. No significant mastoid effusion. Other: None. IMPRESSION: 1. 1.5 cm hypodensity involving the left frontal centrum  semi ovale, somewhat age indeterminate, but favored to be chronic. If there is clinical concern for possible acute ischemia at this location, further assessment with dedicated MRI could be performed for further evaluation as warranted. 2. No other acute intracranial abnormality. 3. Mild age-related cerebral atrophy with chronic small vessel ischemic disease, progressed from prior. Electronically Signed   By: Jeannine Boga M.D.   On: 11/21/2021 22:07   ? ?Procedures ?Procedures  ? ? ?Medications Ordered in ED ?Medications  ?aspirin tablet 325 mg (has no administration in time range)  ? ? ?ED Course/ Medical Decision Making/ A&P ?Clinical Course as of 11/21/21 2331  ?Mon Nov 21, 2021  ?2307 Creatinine: 0.78 [JS]  ?  ?Clinical Course User Index ?[JS] Janeece Fitting, PA-C  ? ?                        ?Medical Decision Making ?Amount and/or Complexity of Data Reviewed ?Labs: ordered. Decision-making details documented in ED Course. ?Radiology: ordered. ? ?Risk ?OTC drugs. ?Decision  regarding hospitalization. ? ?This patient presents to the ED for concern of right upper extremity weakness, aphasia, this involves a number of treatment options, and is a complaint that carries with it a high risk of complications

## 2021-11-21 NOTE — ED Notes (Signed)
Patient transported to CT 

## 2021-11-21 NOTE — ED Triage Notes (Signed)
Pt BIB [facility] from home after family called for SOB. Pt reports facial droop, slurred speech and right sided weakness starting March 15th. Pt was not evaluated and symptoms improved and then became worse 2 days ago. Patient continues to have slurred speech and right sided weakness, on eliquis for previous DVTs. BP 190/110, HR 76, CBG 146 ?

## 2021-11-22 ENCOUNTER — Inpatient Hospital Stay (HOSPITAL_BASED_OUTPATIENT_CLINIC_OR_DEPARTMENT_OTHER): Payer: Medicare Other

## 2021-11-22 ENCOUNTER — Encounter (HOSPITAL_COMMUNITY): Payer: Self-pay

## 2021-11-22 ENCOUNTER — Encounter (HOSPITAL_COMMUNITY): Payer: Self-pay | Admitting: Internal Medicine

## 2021-11-22 DIAGNOSIS — I6389 Other cerebral infarction: Secondary | ICD-10-CM

## 2021-11-22 DIAGNOSIS — I10 Essential (primary) hypertension: Secondary | ICD-10-CM | POA: Diagnosis not present

## 2021-11-22 DIAGNOSIS — I639 Cerebral infarction, unspecified: Secondary | ICD-10-CM | POA: Diagnosis not present

## 2021-11-22 LAB — COMPREHENSIVE METABOLIC PANEL
ALT: 9 U/L (ref 0–44)
AST: 13 U/L — ABNORMAL LOW (ref 15–41)
Albumin: 2.6 g/dL — ABNORMAL LOW (ref 3.5–5.0)
Alkaline Phosphatase: 76 U/L (ref 38–126)
Anion gap: 8 (ref 5–15)
BUN: 11 mg/dL (ref 8–23)
CO2: 21 mmol/L — ABNORMAL LOW (ref 22–32)
Calcium: 8.9 mg/dL (ref 8.9–10.3)
Chloride: 106 mmol/L (ref 98–111)
Creatinine, Ser: 0.71 mg/dL (ref 0.44–1.00)
GFR, Estimated: >60 mL/min (ref >60)
Glucose, Bld: 101 mg/dL — ABNORMAL HIGH (ref 70–99)
Potassium: 4 mmol/L (ref 3.5–5.1)
Sodium: 135 mmol/L (ref 135–145)
Total Bilirubin: 0.3 mg/dL (ref 0.3–1.2)
Total Protein: 7 g/dL (ref 6.5–8.1)

## 2021-11-22 LAB — CBC WITH DIFFERENTIAL/PLATELET
Abs Immature Granulocytes: 0.02 10*3/uL (ref 0.00–0.07)
Basophils Absolute: 0.1 10*3/uL (ref 0.0–0.1)
Basophils Relative: 1 %
Eosinophils Absolute: 0.2 10*3/uL (ref 0.0–0.5)
Eosinophils Relative: 4 %
HCT: 37.6 % (ref 36.0–46.0)
Hemoglobin: 11.8 g/dL — ABNORMAL LOW (ref 12.0–15.0)
Immature Granulocytes: 0 %
Lymphocytes Relative: 37 %
Lymphs Abs: 2.1 10*3/uL (ref 0.7–4.0)
MCH: 23.3 pg — ABNORMAL LOW (ref 26.0–34.0)
MCHC: 31.4 g/dL (ref 30.0–36.0)
MCV: 74.2 fL — ABNORMAL LOW (ref 80.0–100.0)
Monocytes Absolute: 0.4 10*3/uL (ref 0.1–1.0)
Monocytes Relative: 7 %
Neutro Abs: 2.8 10*3/uL (ref 1.7–7.7)
Neutrophils Relative %: 51 %
Platelets: 448 10*3/uL — ABNORMAL HIGH (ref 150–400)
RBC: 5.07 MIL/uL (ref 3.87–5.11)
RDW: 17 % — ABNORMAL HIGH (ref 11.5–15.5)
WBC: 5.5 10*3/uL (ref 4.0–10.5)
nRBC: 0 % (ref 0.0–0.2)

## 2021-11-22 LAB — ECHOCARDIOGRAM COMPLETE
Area-P 1/2: 2.91 cm2
S' Lateral: 3.36 cm

## 2021-11-22 LAB — LIPID PANEL
Cholesterol: 143 mg/dL (ref 0–200)
HDL: 39 mg/dL — ABNORMAL LOW (ref >40)
LDL Cholesterol: 86 mg/dL (ref 0–99)
Total CHOL/HDL Ratio: 3.7 RATIO
Triglycerides: 92 mg/dL (ref <150)
VLDL: 18 mg/dL (ref 0–40)

## 2021-11-22 LAB — MAGNESIUM: Magnesium: 1.9 mg/dL (ref 1.7–2.4)

## 2021-11-22 LAB — HEMOGLOBIN A1C
Hgb A1c MFr Bld: 6.2 % — ABNORMAL HIGH (ref 4.8–5.6)
Mean Plasma Glucose: 131.24 mg/dL

## 2021-11-22 MED ORDER — NALOXONE HCL 0.4 MG/ML IJ SOLN
0.4000 mg | INTRAMUSCULAR | Status: DC | PRN
Start: 2021-11-22 — End: 2021-11-24

## 2021-11-22 MED ORDER — ASPIRIN 81 MG PO CHEW
81.0000 mg | CHEWABLE_TABLET | Freq: Once | ORAL | Status: AC
  Administered 2021-11-22: 81 mg via ORAL
  Filled 2021-11-22: qty 1

## 2021-11-22 MED ORDER — FENTANYL CITRATE PF 50 MCG/ML IJ SOSY
25.0000 ug | PREFILLED_SYRINGE | INTRAMUSCULAR | Status: DC | PRN
  Administered 2021-11-22 – 2021-11-23 (×6): 25 ug via INTRAVENOUS
  Filled 2021-11-22 (×6): qty 1

## 2021-11-22 MED ORDER — APIXABAN 5 MG PO TABS
5.0000 mg | ORAL_TABLET | Freq: Two times a day (BID) | ORAL | Status: DC
Start: 2021-11-22 — End: 2021-11-24
  Administered 2021-11-22 – 2021-11-24 (×5): 5 mg via ORAL
  Filled 2021-11-22 (×5): qty 1

## 2021-11-22 MED ORDER — ROSUVASTATIN CALCIUM 20 MG PO TABS
40.0000 mg | ORAL_TABLET | Freq: Every day | ORAL | Status: DC
  Administered 2021-11-22 – 2021-11-23 (×2): 40 mg via ORAL
  Filled 2021-11-22 (×2): qty 2

## 2021-11-22 MED ORDER — CLOPIDOGREL BISULFATE 75 MG PO TABS
75.0000 mg | ORAL_TABLET | Freq: Every day | ORAL | Status: DC
  Administered 2021-11-22 – 2021-11-24 (×3): 75 mg via ORAL
  Filled 2021-11-22 (×3): qty 1

## 2021-11-22 MED ORDER — METOPROLOL TARTRATE 12.5 MG HALF TABLET
12.5000 mg | ORAL_TABLET | Freq: Two times a day (BID) | ORAL | Status: DC
  Administered 2021-11-22 – 2021-11-24 (×5): 12.5 mg via ORAL
  Filled 2021-11-22 (×5): qty 1

## 2021-11-22 NOTE — Assessment & Plan Note (Signed)
#)   Acute ischemic CVA: suspected dx on the basis of articulation deficits starting 12 days ago, with right tongue deviation suggestive of hypoglossal nerve dysfunction.  Presenting CT head showed a 1.5 cm hypodensity involving the left frontal centrum, age-indeterminate, as further detailed above, while showing evidence of intracranial hemorrhage. CTA head and neck completed, with read currently pending. ?  ?Case was discussed with on-call neurology, Dr. Derry Lory, who has formally consulted, and recommends admission to the hospitalist service for further evaluation management of concern for acute ischemic CVA given presence of hypoglossal nerve dysfunction manifesting as patient's presenting articulation issue.  He recommends additional imaging including MRI brain as well as MRA of the head without contrast and bilateral carotid ultrasounds.  Additionally, he represents a 3-week course of dual antiplatelet therapy with baby aspirin and Plavix followed by daily baby aspirin alone.  This is in addition to restratification regarding acute ischemic CVA risk factors in addition to consults of PT/OT/SLP, recommending that the patient be kept n.p.o. 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  ?  ?Of note, the patient reportedly possesses multiple modifiable CVA risk factors including a history of hypertension, hyperlipidemia, and paroxsymal atrial fibrillation.  Chronically anticoagulated on Eliquis.  Outpatient antilipid regimen notable for rosuvastatin 40 mg p.o. daily.  And hypertensive regimen notable for Lopressor.  She is a former smoker, as above, and denies any known history of obstructive sleep apnea or type 2 diabetes mellitus.  Patient reportedly had full dose aspirin x1 in route to the ED today. ?  ?  ?  ?  ?Plan: Neurology consulted, as above.  Per neurology recommendations, will keep patient n.p.o. pending formal SLP evaluation, as above.   Additionally, PT/OT consults have been placed. Head of the bed at 30 degrees. Neuro checks per protocol. VS per protocol.  Resume home beta-blocker, as the patient presents well outside of the window for observance of permissive hypertension.  Monitor on telemetry.  Follow-up for formal read relating to CTA head and neck.  MRI brain.  Per neurology recommendations, MRA head without contrast as well as bilateral carotid ultrasounds been ordered.  Additionally, TTE in the morning. Check lipid panel and A1c.  3-week course of dual antiplatelet therapy followed by a daily baby aspirin alone.  This is in addition to continuation of home Eliquis in the setting of paroxysmal atrial fibrillation. ?  ?  ?  ?

## 2021-11-22 NOTE — ED Notes (Signed)
RN assisted pt on bedpan.  ? ?RN provided pt hand warmer for left knee. Wound Care RN bandaged left knee.  ?

## 2021-11-22 NOTE — Progress Notes (Signed)
Carotid artery duplex completed. ?Refer to "CV Proc" under chart review to view preliminary results. ? ?11/22/2021 8:25 AM ?Eula Fried., MHA, RVT, RDCS, RDMS   ?

## 2021-11-22 NOTE — H&P (Signed)
?History and Physical  ? ? ?PLEASE NOTE THAT DRAGON DICTATION SOFTWARE WAS USED IN THE CONSTRUCTION OF THIS NOTE. ? ? ?Mercedes [name] [id number] DOB: Jul 23, 1956 DOA: 11/21/2021 ? ?PCP: Binnie Rail, MD  ?Patient coming from: home  ? ?I have personally briefly reviewed patient's old medical records in [place] ? ?Chief Complaint: difficulty speaking ? ?HPI: Mercedes [name] is a 66 y.o. female with medical history significant for essential hypertension, hyperlipidemia, peripheral artery disease status post bilateral AKA's paroxysmal atrial fibrillation chronically anticoagulated on Eliquis, sick sinus syndrome status post pacemaker placement, who is admitted to [facility] on 11/21/2021 with suspected acute ischemic CVA after presenting from home to [facility] ED complaining of difficulty speaking.  ? ?The patient reports that 12 days ago she awoke with a severe headache as well as new onset speech difficulty.  She reports that she is able to correctly identify the words that she wishes to speak, but experiences difficulty with articulation of those words.  She reports that this issue with articulation has been constant since onset 12 days ago.  She notes associated relative difficulty with swallowing since that time as well.  She reports that this may have also been associated with some right upper extremity weakness with the same timing of onset, but notes that this is subsequently improved back to baseline. ? ?Otherwise, she denies any associated acute focal weakness, numbness, paresthesias, dizziness, vertigo, nausea, vomiting, acute change in vision, facial droop.  She also notes interval resolution of her headache.  No preceding trauma.  Denies any associated chest pain, shortness of breath, palpitations, diaphoresis, presyncope, or syncope. ? ?Denies any previous known history of stroke.  Medical history notable for essential hypertension with outpatient hypertensive regimen limited to the  Lopressor.  She also has a history of hyperlipidemia for which she reports good compliance on high intensity rosuvastatin.  Denies any known history of underlying diabetes.  Confirms a history of paroxysmal atrial fibrillation for which she is on chronic anticoagulation via Eliquis.  Otherwise, no blood thinners as an outpatient.  She is a former smoker, having completely quit smoking back in 2019 without subsequent resumption.  Denies any known history of underlying obstructive sleep apnea. ? ? ? ?ED Course:  ?Vital signs in the ED were notable for the following: Afebrile; heart rate 66-82; blood pressure 133/79 - 150/86 mmHg; respiratory rate 14-19, oxygen saturation 95 to 100% on room air. ? ?Labs were notable for the following: CMP notable for the following: Sodium 135, creatinine 0.78, glucose 112.  CBC notable for white blood cell count 5600, hemoglobin 12.6, platelet count 497.  INR 1.0.  Urinalysis showed no white blood cells.  Serum ethanol level less than 10. ? ?Imaging and additional notable ED work-up: EKG shows sinus rhythm with heart rate 70, was read as demonstrating right bundle branch block and left anterior fascicular block, she has nonspecific T wave inversion in aVL, no evidence of ST changes, including no evidence of ST elevation. ? ?Noncontrast CT head shows 1.5 cm hypodensity involving the left frontal centrum 70 ovale, felt to be somewhat indeterminate but favored to be chronic,; otherwise, no evidence of acute intracranial process, including no evidence of intracranial hemorrhage.  CT head/neck has been completed, with result/read currently pending. ? ?Case was discussed with on-call neurology, Dr. Lorrin Goodell, who has formally consulted, and recommends admission to the hospitalist service for further evaluation management of concern for acute ischemic CVA given presence of hypoglossal nerve dysfunction manifesting  as patient's presenting articulation issue.  He recommends additional imaging  including MRI brain as well as MRA of the head without contrast and bilateral carotid ultrasounds.  Additionally, he represents a 3-week course of dual antiplatelet therapy with baby aspirin and Plavix followed by daily baby aspirin alone.  This is in addition to restratification regarding acute ischemic CVA risk factors in addition to consults of PT/OT/SLP, recommending that the patient be kept n.p.o. 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  ? ?Subsequently, the patient was admitted for further evaluation and management of suspected acute ischemic CVA after presenting with 12 days of articulation deficit.  ? ? ?Review of Systems: As per HPI otherwise 10 point review of systems negative.  ? ?Past Medical History:  ?Diagnosis Date  ? Abnormal LFTs   ? a. in the past when on Lipitor  ? Anemia   ? as a young woman  ? Anxiety   ? Arnold-Chiari malformation, type I ([place])   ? MRI brain 02/2010  ? CAD (coronary artery disease)   ? a. NSTEMI 07/2009: LHC - D2 40%, LAD irreg., EF 50% with apical AK (tako-tsubo CM);  b. Inf STEMI (04/2013): Tx Promus DES to CFX;  c. 08/2013 [facility] CL: No ischemia, dist ant/ap/inf-ap infarct, EF  d. 06/2016 NSTEMI with LM disease: s/p CABG on 07/17/2016 w/ LIMA-LAD, SVG-OM, and SVG-dRCA.  ? DEPRESSION   ? Dizziness   ? ? CVA 01/2010 - carotid dopplers with no ICA stenosis  ? GERD (gastroesophageal reflux disease)   ? Headache   ? 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 HYPERLIPIDEMIA   ? HYPERTENSION   ? MI (myocardial infarction) ([place])   ? PAD (peripheral artery disease) ([place])   ? s/p L fem pop 11/2013 in setting of 1st toe osteo/gangrene  ? Paroxysmal atrial fibrillation ([facility])   ? a. anticoagulated with Eliquis 10/2014  ? Presence of permanent cardiac pacemaker 10/2014  ? leadless permanent pacemaker  ? RA (rheumatoid arthritis) ([place])   ? prior tx by Dr. Ouida Sills  ? Sjogren's syndrome ([place]) 06/02/13  ?  pt denies this (12/01/13)  ? SLE (systemic lupus erythematosus) ([place]) dx 05/2013  ? follows with rheum Ouida Sills)  ? Tachy-brady syndrome ([place])   ? a. s/p STJ Leadless pacemaker 11-04-2014 Dr Rayann Heman  ? 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 f/u echo 09/2009: EF 50-55%, mild LVH, mod diast dysfxn, mild apical HK  ? Wears dentures   ? Wears glasses   ? ? ?Past Surgical History:  ?Procedure Laterality Date  ? ABDOMINAL AORTAGRAM N/A 11/24/2013  ? Procedure: ABDOMINAL AORTAGRAM WITH BILATERAL RUNOFF WITH POSSIBLE INTERVENTION;  Surgeon: Angelia Mould, MD;  [facility] C[facility] LAB;  Service: Cardiovascular;  Laterality: N/A;  ? ABDOMINAL AORTOGRAM W/LOWER EXTREMITY N/A 04/25/2019  ? Procedure: ABDOMINAL AORTOGRAM W/LOWER EXTREMITY;  Surgeon: Elam Dutch, MD;  Location: [place] [facility] CV LAB;  Service: Cardiovascular;  Laterality: N/A;  ? ABDOMINAL AORTOGRAM W/LOWER EXTREMITY Bilateral 05/15/2019  ? Procedure: ABDOMINAL AORTOGRAM W/LOWER EXTREMITY;  Surgeon: Serafina Mitchell, MD;  Location: [place] CV LAB;  Service: Cardiovascular;  Laterality: Bilateral;  ? ABDOMINAL AORTOGRAM W/LOWER EXTREMITY N/A 08/23/2020  ? Procedure: ABDOMINAL AORTOGRAM W/LOWER EXTREMITY;  Surgeon: Waynetta Sandy, MD;  Location: [place] CV LAB;  Service: Cardiovascular;  Laterality: N/A;  ? ABDOMINAL AORTOGRAM W/LOWER EXTREMITY N/A 10/29/2020  ? Procedure: ABDOMINAL AORTOGRAM W/LOWER EXTREMITY;  Surgeon: Elam Dutch, MD;  Location:  [facility] INVASIVE CV LAB;  Service: Cardiovascular;  Laterality: N/A;  ? AMPUTATION Left 12/02/2013  ? Procedure: LEFT 1ST TOE AMPUTATION;  Surgeon: Elam Dutch, MD;  Location: [place];  Service: Vascular;  Laterality: Left;  ? AMPUTATION Left 07/16/2019  ? Procedure: LEFT TRANSMETATARSAL AMPUTATION;  Surgeon: Newt Minion, MD;  Location: [facility];  Service: Orthopedics;  Laterality: Left;  ? AMPUTATION Left 10/01/2019  ? Procedure: LEFT AMPUTATION ABOVE KNEE;  Surgeon: Elam Dutch, MD;  Location: [facility];  Service: Vascular;  Laterality: Left;  ? AMPUTATION Right 08/31/2020  ? Procedure: Right first toe amputation;  Surgeon: Elam Dutch, MD;  Location: [facility];  Service: Vascular;  Laterality: Right;  ?

## 2021-11-22 NOTE — Assessment & Plan Note (Signed)
?  ?  ?#)   Paroxysmal atrial fibrillation: Documented history of such. In setting of CHA2DS2-VASc score of 7, there is an indication for chronic anticoagulation for thromboembolic prophylaxis. Consistent with this, patient is chronically anticoagulated on Eliquis. Home AV nodal blocking regimen: Metoprolol tartrate.  Presenting EKG reflects sinus rhythm without evidence of acute ischemic changes.  ?  ?  ?Plan: monitor strict I's & O's and daily weights. Repeat BMP/CBC in AM. Check serum mag level. Continue home AV nodal blocking regimen.  Continue home Eliquis.  Echo ordered for the morning as a component of evaluation for suspected acute ischemic CVA, as above. ?  ?

## 2021-11-22 NOTE — Progress Notes (Addendum)
STROKE TEAM PROGRESS NOTE  ? ?INTERVAL HISTORY ?No family at bedside. Patient was awake, alert, pleasant and engaged with evaluation. ?11/09/2021 patient woke up with a headache and fatigue. Later that day noted right hand weakness when trying to call her daughter. On phone call, daughter noted slurred speech. When weakness did not improve, she went to the ED.  ?Patient was on eliquis for MI and has a leadless pacemaker. Reported stop taking eliquis after Christmas for perceived "wasn't doing anything for me". Denied cost being a factor of discontinuation, however would prefer to have mail in pharmacy. At baseline, patient uses wheelchair for locomotion, is independent in IADLs/ADLs, and does not drive. She lives alone. Head CT showed large left subcortical infarct of indeterminate age. Unable to obtain MRI due to pacemaker incompatibility. CTA showed 60% ICA stenosis, plan to correlate with carotid ultrasound for accuracy.  ?On exam, patient's symptoms are much improved. Still has residual dysarthia, RUE weakness. ?Patient had no further concerns. ? ?Vitals:  ? 11/22/21 0948 11/22/21 1100 11/22/21 1200 11/22/21 1300  ?BP: (!) 165/92 138/77 124/88 138/90  ?Pulse: 66 (!) 57 (!) 56 (!) 58  ?Resp:  16 14 17   ?Temp:      ?TempSrc:      ?SpO2:  95% 97% 97%  ? ?CBC:  ?Recent Labs  ?Lab 11/21/21 ?2101 11/22/21 ?0415  ?WBC 5.6 5.5  ?NEUTROABS 3.1 2.8  ?HGB 12.6 11.8*  ?HCT 41.7 37.6  ?MCV 75.8* 74.2*  ?PLT 497* 448*  ? ?Basic Metabolic Panel:  ?Recent Labs  ?Lab 11/21/21 ?2101 11/22/21 ?0415  ?NA 135 135  ?K 4.0 4.0  ?CL 105 106  ?CO2 23 21*  ?GLUCOSE 112* 101*  ?BUN 13 11  ?CREATININE 0.78 0.71  ?CALCIUM 9.2 8.9  ?MG  --  1.9  ? ?Lipid Panel:  ?Recent Labs  ?Lab 11/22/21 ?0415  ?CHOL 143  ?TRIG 92  ?HDL 39*  ?CHOLHDL 3.7  ?VLDL 18  ?LDLCALC 86  ? ?HgbA1c:  ?Recent Labs  ?Lab 11/22/21 ?0415  ?HGBA1C 6.2*  ? ?Urine Drug Screen:  ?Recent Labs  ?Lab 11/21/21 ?2230  ?LABOPIA NONE DETECTED  ?COCAINSCRNUR NONE DETECTED  ?LABBENZ  NONE DETECTED  ?AMPHETMU NONE DETECTED  ?THCU NONE DETECTED  ?LABBARB NONE DETECTED  ? ?Alcohol Level  ?Recent Labs  ?Lab 11/21/21 ?2101  ?ETH <10  ? ? ?IMAGING past 24 hours ?CT ANGIO HEAD NECK W WO CM ? ?Result Date: 11/22/2021 ?CLINICAL DATA:  Initial evaluation for neuro deficit. EXAM: CT ANGIOGRAPHY HEAD AND NECK TECHNIQUE: Multidetector CT imaging of the head and neck was performed using the standard protocol during bolus administration of intravenous contrast. Multiplanar CT image reconstructions and MIPs were obtained to evaluate the vascular anatomy. Carotid stenosis measurements (when applicable) are obtained utilizing NASCET criteria, using the distal internal carotid diameter as the denominator. RADIATION DOSE REDUCTION: This exam was performed according to the departmental dose-optimization program which includes automated exposure control, adjustment of the mA and/or kV according to patient size and/or use of iterative reconstruction technique. CONTRAST:  8mL OMNIPAQUE IOHEXOL 350 MG/ML SOLN COMPARISON:  CT from earlier the same day. FINDINGS: CTA NECK FINDINGS Aortic arch: Visualized aortic arch normal caliber with normal branch pattern. No stenosis about the origin of the great vessels. Right carotid system: Right CCA patent from its origin to the bifurcation without stenosis. Scattered mixed plaque about the right carotid bulb without hemodynamically significant stenosis. Right ICA patent distally without stenosis or dissection. Approximate 60% stenosis involving the proximal  right ECA noted. Left carotid system: Left common carotid artery patent from its origin to the bifurcation without stenosis. Scattered mixed plaque about the left carotid bulb and proximal left ICA with associated stenosis of up to 60% by NASCET criteria (series 7, image 316). Left ICA patent distally without stenosis or dissection. Vertebral arteries: Both vertebral arteries arise from subclavian arteries. No proximal  subclavian artery stenosis. Vertebral arteries patent without stenosis or dissection. Skeleton: No discrete or worrisome osseous lesions. Median sternotomy wires noted. Other neck: No other acute soft tissue abnormality within the neck. 1.4 cm right thyroid nodule noted, of doubtful significance given size and patient age, no follow-up imaging recommended (ref: J Am Coll Radiol. 2015 Feb;12(2): 143-50). Upper chest: Enlarged precarinal lymph node measures up to 1.5 cm, indeterminate. Advanced emphysematous changes noted within the lungs. Review of the MIP images confirms the above findings CTA HEAD FINDINGS Anterior circulation: Petrous segments patent bilaterally. Mild atheromatous change within the carotid siphons with no more than mild multifocal narrowing. A1 segments patent bilaterally. Normal anterior communicating artery complex. Anterior cerebral arteries patent without stenosis. No M1 stenosis or occlusion. No proximal MCA branch occlusion. Distal MCA branches perfused and symmetric. Posterior circulation: Both vertebral arteries patent to the vertebrobasilar junction without stenosis. Both PICA patent. Basilar patent to its distal aspect without stenosis. Superior cerebellar arteries patent bilaterally. Right PCA supplied primarily via the basilar. Fetal type origin left PCA. Both PCAs patent to their distal aspects without stenosis. Venous sinuses: Patent allowing for timing the contrast bolus. Anatomic variants: As above.  No aneurysm. Review of the MIP images confirms the above findings IMPRESSION: 1. Negative CTA for large vessel occlusion or other emergent finding. 2. Short-segment 60% atheromatous stenosis involving the proximal cervical left ICA. 3. 60% stenosis involving the proximal right ECA. 4. Emphysema (ICD10-J43.9). 5. Enlarged 1.5 cm precarinal lymph node, indeterminate, but could be reactive. Electronically Signed   By: Rise MuBenjamin  McClintock M.D.   On: 11/22/2021 00:40  ? ?ECHOCARDIOGRAM  COMPLETE ? ?Result Date: 11/22/2021 ?   ECHOCARDIOGRAM REPORT   Patient Name:   Floyde ParkinsRHONDA L Neisler Date of Exam: 11/22/2021 Medical Rec #:  [id number]         Height:       65.0 in Accession #:    [id number]        Weight:       92.0 lb Date of Birth:  1956/01/12         BSA:          1.422 m? Patient Age:    65 years          BP:           170/81 mmHg Patient Gender: F                 HR:           50 bpm. Exam Location:  Inpatient Procedure: 2D Echo, Color Doppler and Cardiac Doppler Indications:    Aphasia  History:        Patient has prior history of Echocardiogram examinations. CAD,                 PAD; Risk Factors:Hypertension.  Sonographer:    MB Referring Phys: [id number] JUSTIN B HOWERTER IMPRESSIONS  1. Left ventricular ejection fraction, by estimation, is 55 to 60%. The left ventricle has normal function. The left ventricle has no regional wall motion abnormalities. Left ventricular diastolic function could not be evaluated.  2. Right  ventricular systolic function is normal. The right ventricular size is normal. There is normal pulmonary artery systolic pressure. The estimated right ventricular systolic pressure is 18.2 mmHg.  3. The mitral valve is grossly normal. Trivial mitral valve regurgitation. No evidence of mitral stenosis.  4. The aortic valve is grossly normal. Aortic valve regurgitation is not visualized. No aortic stenosis is present.  5. The inferior vena cava is normal in size with greater than 50% respiratory variability, suggesting right atrial pressure of 3 mmHg. FINDINGS  Left Ventricle: Left ventricular ejection fraction, by estimation, is 55 to 60%. The left ventricle has normal function. The left ventricle has no regional wall motion abnormalities. The left ventricular internal cavity size was normal in size. There is  no left ventricular hypertrophy. Left ventricular diastolic function could not be evaluated. Right Ventricle: The right ventricular size is normal. No increase in right  ventricular wall thickness. Right ventricular systolic function is normal. There is normal pulmonary artery systolic pressure. The tricuspid regurgitant velocity is 1.95 m/s, and  with an assumed right atrial pressure

## 2021-11-22 NOTE — Assessment & Plan Note (Signed)
?  ?#)   Hyperlipidemia: documented h/o such. On rosuvastatin 40 mg p.o. daily as outpatient.  ?  ?  ?Plan: continue home statin.  Check hemoglobin A1c as risk stratification relating to modifiable acute ischemic CVA risk factors, as above. ?  ?  ?  ?  ?

## 2021-11-22 NOTE — ED Notes (Signed)
Provided pt bag lunch 

## 2021-11-22 NOTE — Consult Note (Signed)
NEUROLOGY CONSULTATION NOTE  ? ?Date of service: November 22, 2021 ?Patient Name: Mercedes [name] ?MRN:  [id number] ?DOB:  03-28-1956 ?Reason for consult: "Aphasia + worsening R sided weakness" ?Requesting Provider: Rhetta Mura, DO ?_ _ _   _ __   _ __ _ _  __ __   _ __   __ _ ? ?History of Present Illness  ?Mercedes [name] is a 66 y.o. female with PMH significant for Peripheral arterial disease, BL AKA, who presents with about 12 day history of difficulty with speech along with RUE weakness. She woke up with these symptoms along with a headache. She was dropping things from her right hand. Symptoms initially got better so she did not seek medical attention. Symptoms worsened over the last couple of days and so she eventually came to the ED. ? ?Reports that she knows the right word but when she tries to speak, her tongue would not move. She reports trouble initiating swallow and difficulty coughing over the last few days. ? ?LKW: 11/09/21 ?mRS: 2 ?tNKASE: not offered, outside window ?Thrombectomy: not offered, outside window ?NIHSS components Score: Comment  ?1a Level of Conscious 0[x]  1[]  2[]  3[]      ?1b LOC Questions 0[x]  1[]  2[]       ?1c LOC Commands 0[x]  1[]  2[]       ?2 Best Gaze 0[x]  1[]  2[]       ?3 Visual 0[x]  1[]  2[]  3[]      ?4 Facial Palsy 0[x]  1[]  2[]  3[]      ?5a Motor Arm - left 0[x]  1[]  2[]  3[]  4[]  UN[]    ?5b Motor Arm - Right 0[x]  1[]  2[]  3[]  4[]  UN[]    ?6a Motor Leg - Left 0[x]  1[]  2[]  3[]  4[]  UN[]    ?6b Motor Leg - Right 0[x]  1[]  2[]  3[]  4[]  UN[]    ?7 Limb Ataxia 0[x]  1[]  2[]  3[]  UN[]     ?8 Sensory 0[x]  1[]  2[]  UN[]      ?9 Best Language 0[x]  1[]  2[]  3[]      ?10 Dysarthria 0[]  1[x]  2[]  UN[]      ?11 Extinct. and Inattention 0[x]  1[]  2[]       ?TOTAL: 1   ? ?  ?ROS  ? ?Constitutional Denies weight loss, fever and chills.   ?HEENT Denies changes in vision and hearing.   ?Respiratory Denies SOB and cough.   ?CV Denies palpitations and CP   ?GI Denies abdominal pain, nausea, vomiting and diarrhea.   ?GU  Denies dysuria and urinary frequency.   ?MSK Denies myalgia and joint pain.   ?Skin Denies rash and pruritus.   ?Neurological Denies headache and syncope.   ?Psychiatric Denies recent changes in mood. Denies anxiety and depression.   ? ?Past History  ? ?Past Medical History:  ?Diagnosis Date  ? Abnormal LFTs   ? a. in the past when on Lipitor  ? Anemia   ? as a young woman  ? Anxiety   ? Arnold-Chiari malformation, type I ([place])   ? MRI brain 02/2010  ? CAD (coronary artery disease)   ? a. NSTEMI 07/2009: LHC - D2 40%, LAD irreg., EF 50% with apical AK (tako-tsubo CM);  b. Inf STEMI (04/2013): Tx Promus DES to CFX;  c. 08/2013 Lexi CL: No ischemia, dist ant/ap/inf-ap infarct, EF  d. 06/2016 NSTEMI with LM disease: s/p CABG on 07/17/2016 w/ LIMA-LAD, SVG-OM, and SVG-dRCA.  ? DEPRESSION   ? Dizziness   ? ? CVA 01/2010 - carotid dopplers with no ICA stenosis  ? GERD (  gastroesophageal reflux disease)   ? Headache   ? History of transmetatarsal amputation of left foot ([place]) 07/17/2019  ? HYPERLIPIDEMIA   ? HYPERTENSION   ? MI (myocardial infarction) ([place])   ? PAD (peripheral artery disease) ([place])   ? s/p L fem pop 11/2013 in setting of 1st toe osteo/gangrene  ? Paroxysmal atrial fibrillation ([facility])   ? a. anticoagulated with Eliquis 10/2014  ? Presence of permanent cardiac pacemaker 10/2014  ? leadless permanent pacemaker  ? RA (rheumatoid arthritis) ([place])   ? prior tx by Dr. Ouida Sills  ? Sjogren's syndrome ([place]) 06/02/13  ? pt denies this (12/01/13)  ? SLE (systemic lupus erythematosus) ([place]) dx 05/2013  ? follows with rheum Ouida Sills)  ? Tachy-brady syndrome ([place])   ? a. s/p STJ Leadless pacemaker 11-04-2014 Dr Rayann Heman  ? 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 f/u echo 09/2009: EF 50-55%, mild LVH, mod diast dysfxn, mild apical HK  ? Wears dentures   ? Wears glasses   ? ?Past Surgical History:  ?Procedure Laterality Date  ? ABDOMINAL AORTAGRAM N/A 11/24/2013  ? Procedure: ABDOMINAL AORTAGRAM WITH BILATERAL RUNOFF WITH POSSIBLE  INTERVENTION;  Surgeon: Angelia Mould, MD;  Location: [facility] CATH LAB;  Service: Cardiovascular;  Laterality: N/A;  ? ABDOMINAL AORTOGRAM W/LOWER EXTREMITY N/A 04/25/2019  ? Procedure: ABDOMINAL AORTOGRAM W/LOWER EXTREMITY;  Surgeon: Elam Dutch, MD;  Location: [place] CV LAB;  Service: Cardiovascular;  Laterality: N/A;  ? ABDOMINAL AORTOGRAM W/LOWER EXTREMITY Bilateral 05/15/2019  ? Procedure: ABDOMINAL AORTOGRAM W/LOWER EXTREMITY;  Surgeon: Serafina Mitchell, MD;  Location: [place] CV LAB;  Service: Cardiovascular;  Laterality: Bilateral;  ? ABDOMINAL AORTOGRAM W/LOWER EXTREMITY N/A 08/23/2020  ? Procedure: ABDOMINAL AORTOGRAM W/LOWER EXTREMITY;  Surgeon: Waynetta Sandy, MD;  Location: [place] CV LAB;  Service: Cardiovascular;  Laterality: N/A;  ? ABDOMINAL AORTOGRAM W/LOWER EXTREMITY N/A 10/29/2020  ? Procedure: ABDOMINAL AORTOGRAM W/LOWER EXTREMITY;  Surgeon: Elam Dutch, MD;  Location: [place] CV LAB;  Service: Cardiovascular;  Laterality: N/A;  ? AMPUTATION Left 12/02/2013  ? Procedure: LEFT 1ST TOE AMPUTATION;  Surgeon: Elam Dutch, MD;  Location: [place];  Service: Vascular;  Laterality: Left;  ? AMPUTATION Left 07/16/2019  ? Procedure: LEFT TRANSMETATARSAL AMPUTATION;  Surgeon: Newt Minion, MD;  Location: [place];  Service: Orthopedics;  Laterality: Left;  ? AMPUTATION Left 10/01/2019  ? Procedure: LEFT AMPUTATION ABOVE KNEE;  Surgeon: Elam Dutch, MD;  Location: [place];  Service: Vascular;  Laterality: Left;  ? AMPUTATION Right 08/31/2020  ? Procedure: Right first toe amputation;  Surgeon: Elam Dutch, MD;  Location: [place];  Service: Vascular;  Laterality: Right;  ? AMPUTATION Right 11/23/2020  ? Procedure: RIGHT AMPUTATION ABOVE KNEE;  Surgeon: Elam Dutch, MD;  Location: [facility] OR;  Service: Vascular;  Laterality: Right;  ? APPLICATION OF WOUND VAC Left 11/03/2019  ? Procedure: Application Of Wound Vac, left lower extremity;  Surgeon: Elam Dutch, MD;   Location: [place];  Service: Vascular;  Laterality: Left;  ? CARDIAC CATHETERIZATION N/A 07/12/2016  ? Procedure: Left Heart Cath and Coronary Angiography;  Surgeon: Troy Sine, MD;  Location: [place] CV LAB;  Service: Cardiovascular;  Laterality: N/A;  ? CARDIAC CATHETERIZATION N/A 07/14/2016  ? Procedure: Intravascular Ultrasound/IVUS;  Surgeon: Nelva Bush, MD;  Location: [place] CV LAB;  Service: Cardiovascular;  Laterality: N/A;  ? COLONOSCOPY    ? CORONARY ANGIOPLASTY  04/2013  ? CORONARY ARTERY BYPASS GRAFT N/A 07/17/2016  ?  Procedure: CORONARY ARTERY BYPASS GRAFTING (CABG)x3 using right greater saphenous vein harvested endoscopiclly and left internal mammary artery;  Surgeon: Ivin Poot, MD;  Location: [place];  Service: Open Heart Surgery;  Laterality: N/A;  ? ENDARTERECTOMY FEMORAL Left 05/12/2019  ? Procedure: LEFT Femoral Endarterectomy;  Surgeon: Elam Dutch, MD;  Location[facility] OR;  Service: Vascular;  Laterality: Left;  ? FEMORAL-POPLITEAL BYPASS GRAFT Left 12/02/2013  ? Procedure:  LEFT FEMORAL-BELOW KNEE POPLITEAL ARTERY BYPASS GRAFT;  Surgeon: Elam Dutch, MD;  Location: [place];  Service: Vascular;  Laterality: Left;  ? FEMORAL-POPLITEAL BYPASS GRAFT Left 09/25/2019  ? Procedure: THROMBECTOMY and PROPATEN BYPASS  FEMORAL TO BELOW KNEE POPLITEAL ARTERY BYPASS GRAFT LEFT LEG;  Surgeon: [name], MD;  Location: [place] OR;  Service: Vascular;  Laterality: Left;  ? FEMORAL-POPLITEAL BYPASS GRAFT Right 08/24/2020  ? Procedure: RIGHT FEMORAL-BELOW KNEE POPLITEAL ARTERY BYPASS USING GORE PROPATEN GRAFT;  Surgeon: Waynetta Sandy, MD;  Location: [place];  Service: Vascular;  Laterality: Right;  ? FEMORAL-POPLITEAL BYPASS GRAFT Right 11/01/2020  ? Procedure: Thrombectomy and Revision of Right Femoral to below knee Bypass with Interpostional graft to Tibial / peroneal Trunk and Endarterectomy Tibial Letitia Caul Trunk.;  Surgeon: Elam Dutch, MD;  Location: [facility] OR;  Service:  Vascular;  Laterality: Right;  ? INTRAOPERATIVE ARTERIOGRAM Right 11/01/2020  ? Procedure: ANTEGRADE RIGHT LOWER EXTREMITY INTRA OPERATIVE ARTERIOGRAM;  Surgeon: Elam Dutch, MD;  Location: [place];  Service: Vascu

## 2021-11-22 NOTE — Progress Notes (Signed)
?Progress note ? ?Mercedes [name] DOB: February 10, 1956 DOA: 11/21/2021 ? ?PCP: Pincus SanesBurns, Stacy J, MD  ?Patient coming from: home  ? ?Chief Complaint: difficulty speaking ? ?HPI: Mercedes [name] is a 66 y.o. female with medical history significant for essential hypertension, hyperlipidemia, peripheral artery disease status post bilateral AKA's paroxysmal atrial fibrillation chronically anticoagulated on Eliquis, sick sinus syndrome status post pacemaker placement, who is [facility] on [facility]/21/2021 with suspected acute ischemic CVA after presenting from home to [facility] ED complaining of difficulty speaking.  ? ?The patient reports that 12 days ago she awoke with a severe headache as well as new onset speech difficulty.  She reports that she is able to correctly identify the words that she wishes to speak, but experiences difficulty with articulation of those words.  She reports that this issue with articulation has been constant since onset 12 days ago.  She notes associated relative difficulty with swallowing since that time as well.  She reports that this may have also been associated with some right upper extremity weakness with the same timing of onset, but notes that this is subsequently improved back to baseline. ? ?Otherwise, she denies any associated acute focal weakness, numbness, paresthesias, dizziness, vertigo, nausea, vomiting, acute change in vision, facial droop.  She also notes interval resolution of her headache.  No preceding trauma.  Denies any associated chest pain, shortness of breath, palpitations, diaphoresis, presyncope, or syncope. ? ?Denies any previous known history of stroke.  Medical history notable for essential hypertension with outpatient hypertensive regimen limited to the Lopressor.  She also has a history of hyperlipidemia for which she reports good compliance on high intensity rosuvastatin.  Denies any known history of underlying diabetes.  Confirms a history  of paroxysmal atrial fibrillation for which she is on chronic anticoagulation via Eliquis.  Otherwise, no blood thinners as an outpatient.  She is a former smoker, having completely quit smoking back in 2019 without subsequent resumption.  Denies any known history of underlying obstructive sleep apnea. ? ? ? ?ED Course:  ?Vital signs in the ED were notable for the following: Afebrile; heart rate 66-82; blood pressure 133/79 - 150/86 mmHg; respiratory rate 14-19, oxygen saturation 95 to 100% on room air. ? ?Labs were notable for the following: CMP notable for the following: Sodium 135, creatinine 0.78, glucose 112.  CBC notable for white blood cell count 5600, hemoglobin 12.6, platelet count 497.  INR 1.0.  Urinalysis showed no white blood cells.  Serum ethanol level less than 10. ? ?Imaging and additional notable ED work-up: EKG shows sinus rhythm with heart rate 70, was read as demonstrating right bundle branch block and left anterior fascicular block, she has nonspecific T wave inversion in aVL, no evidence of ST changes, including no evidence of ST elevation. ? ?Noncontrast CT head shows 1.5 cm hypodensity involving the left frontal centrum 70 ovale, felt to be somewhat indeterminate but favored to be chronic,; otherwise, no evidence of acute intracranial process, including no evidence of intracranial hemorrhage.  CT head/neck has been completed, with result/read currently pending. ? ?Case was discussed with on-call neurology, Dr. Derry LoryKhaliqdina, who has formally consulted, and recommends admission to the hospitalist service for further evaluation management of concern for acute ischemic CVA given presence of hypoglossal nerve dysfunction manifesting as patient's presenting articulation issue.  He recommends additional imaging including MRI brain as well as MRA of the head without contrast and bilateral carotid ultrasounds.  Additionally, he represents a 3-week course  of dual antiplatelet therapy with baby aspirin  and Plavix followed by daily baby aspirin alone.  This is in addition to restratification regarding acute ischemic CVA risk factors in addition to consults of PT/OT/SLP, recommending that the patient be kept n.p.o. 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  ? ?Subsequently, the patient was admitted for further evaluation and management of suspected acute ischemic CVA after presenting with 12 days of articulation deficit.  ? ? ? ?Objective  ? ? ?Physical Exam: ?Vitals:  ? 11/22/21 0700 11/22/21 0800 11/22/21 0900 11/22/21 0948  ?BP: 134/77 119/72 117/78 (!) 165/92  ?Pulse: 65 63 72 66  ?Resp: 15 18 13    ?Temp:      ?TempSrc:      ?SpO2: 97% 95% 95%   ? ? ?General: appears to be stated age; alert, oriented ?Skin: warm, dry, no rash ?Head:  AT/[place] ?Mouth:  Oral mucosa membranes appear moist, normal dentition ?Neck: supple; trachea midline ?Heart:  RRR; did not appreciate any M/R/G ?Lungs: CTAB, did not appreciate any wheezes, rales, or rhonchi ?Abdomen: + BS; soft, ND, NT ?Vascular: 2+ pedal pulses b/l; 2+ radial pulses b/l ?Extremities: no peripheral edema, no muscle wasting ?Neuro: 5/5 strength of the proximal and distal flexors and extensors of the upper extremities bilaterally; bilateral AKA's noted; sensation intact in upper extremities b/l; right tongue deviation noted;  ?No e/o facial droop; Normal muscle tone. No tremors. ? ? ? ?Labs on Admission: I have personally reviewed following labs and imaging studies ? ?CBC: ?Recent Labs  ?Lab 11/21/21 ?2101 11/22/21 ?0415  ?WBC 5.6 5.5  ?NEUTROABS 3.1 2.8  ?HGB 12.6 11.8*  ?HCT 41.7 37.6  ?MCV 75.8* 74.2*  ?PLT 497* 448*  ? ?Basic Metabolic Panel: ?Recent Labs  ?Lab 11/21/21 ?2101 11/22/21 ?0415  ?NA 135 135  ?K 4.0 4.0  ?CL 105 106  ?CO2 23 21*  ?GLUCOSE 112* 101*  ?BUN 13 11  ?CREATININE 0.78 0.71  ?CALCIUM 9.2 8.9  ?MG  --  1.9  ? ?GFR: ?CrCl cannot be calculated (Unknown ideal weight.). ?Liver Function  Tests: ?Recent Labs  ?Lab 11/21/21 ?2101 11/22/21 ?0415  ?AST 14* 13*  ?ALT 9 9  ?ALKPHOS 83 76  ?BILITOT 0.2* 0.3  ?PROT 7.6 7.0  ?ALBUMIN 2.9* 2.6*  ? ?No results for input(s): LIPASE, AMYLASE in the last 168 hours. ?No results for input(s): AMMONIA in the last 168 hours. ?Coagulation Profile: ?Recent Labs  ?Lab 11/21/21 ?2101  ?INR 1.0  ? ?Cardiac Enzymes: ?No results for input(s): CKTOTAL, CKMB, CKMBINDEX, TROPONINI in the last 168 hours. ?BNP (last 3 results) ?No results for input(s): PROBNP in the last 8760 hours. ?HbA1C: ?Recent Labs  ?  11/22/21 ?0415  ?HGBA1C 6.2*  ? ?CBG: ?No results for input(s): GLUCAP in the last 168 hours. ?Lipid Profile: ?Recent Labs  ?  11/22/21 ?0415  ?CHOL 143  ?HDL 39*  ?LDLCALC 86  ?TRIG 92  ?CHOLHDL 3.7  ? ?Thyroid Function Tests: ?No results for input(s): TSH, T4TOTAL, FREET4, T3FREE, THYROIDAB in the last 72 hours. ?Anemia Panel: ?No results for input(s): VITAMINB12, FOLATE, FERRITIN, TIBC, IRON, RETICCTPCT in the last 72 hours. ?Urine analysis: ?   ?Component Value Date/Time  ? COLORURINE YELLOW 11/21/2021 2204  ? APPEARANCEUR CLEAR 11/21/2021 2204  ? LABSPEC 1.016 11/21/2021 2204  ? PHURINE 6.0 11/21/2021 2204  ? GLUCOSEU NEGATIVE 11/21/2021 2204  ? HGBUR NEGATIVE 11/21/2021 2204  ? BILIRUBINUR NEGATIVE 11/21/2021 2204  ? KETONESUR NEGATIVE 11/21/2021  2204  ? PROTEINUR NEGATIVE 11/21/2021 2204  ? UROBILINOGEN 1.0 11/06/2014 1508  ? NITRITE NEGATIVE 11/21/2021 2204  ? LEUKOCYTESUR TRACE (A) 11/21/2021 2204  ? ? ?Radiological Exams on Admission: ?CT ANGIO HEAD NECK W WO CM ? ?Result Date: 11/22/2021 ?CLINICAL DATA:  Initial evaluation for neuro deficit. EXAM: CT ANGIOGRAPHY HEAD AND NECK TECHNIQUE: Multidetector CT imaging of the head and neck was performed using the standard protocol during bolus administration of intravenous contrast. Multiplanar CT image reconstructions and MIPs were obtained to evaluate the vascular anatomy. Carotid stenosis measurements (when applicable)  are obtained utilizing NASCET criteria, using the distal internal carotid diameter as the denominator. RADIATION DOSE REDUCTION: This exam was performed according to the departmental dose-optimization pr

## 2021-11-22 NOTE — Progress Notes (Signed)
Mercedes [name]  ?Code Status: FULL ?Mercedes [name] is a 66 y.o. female patient admitted from ED awake, alert - oriented X4 - no acute distress noted. VSS -  no c/o shortness of breath, no c/o chest pain. Cardiac tele in place. ?Fall assessment complete, with patient able to verbalize understanding of risk associated with falls, and verbalized understanding to call nursing before up out of bed. Call light within reach, patient able to voice, and demonstrate understanding. Wound noted to left stump with foam dressing. WOC following. ? ?Will cont to eval and treat per MD orders.  ?Jon Gills, RN  ?11/22/2021  ?

## 2021-11-22 NOTE — Consult Note (Signed)
WOC Nurse Consult Note: ?Patient receiving care in [facility] [phone] ?Reason for Consult: LLE wound proximal to left AKA ?Wound type: Darkened papule on the left AKA over the tip of the remaining femur bone. Patient states it comes to a head and white yellow drainage comes out, it feels better then it comes back. The patient is probably not getting all of the drainage out and it is remaining to the point of accumulating again and the area returns. Very painful just in this particular area ?Pressure Injury POA: NA ?Measurement: 0.4 x 0.4 with a circular darkened area surrounding the papule ?Wound bed: dark purple ?Drainage (amount, consistency, odor) none ?Periwound: intact ?Dressing procedure/placement/frequency: ?Clean the left tip of the AKA with soap and water, pat dry and apply a small piece of xeroform gauze and secure with a foam dressing. Change every other day. Allow patient to apply a warm compress on the area several times a day. ? ?Monitor the wound area(s) for worsening of condition such as: ?Signs/symptoms of infection, increase in size, development of or worsening of odor, ?development of pain, or increased pain at the affected locations.   ?Notify the medical team if any of these develop. ? ?Thank you for the consult. WOC nurse will not follow at this time.   ?Please re-consult the WOC team if needed. ? ?Renaldo Reel. Katrinka Blazing, MSN, RN, CMSRN, AGCNS, WTA ?Wound Treatment Associate ?Pager [phone]   ? ? ?  ?

## 2021-11-22 NOTE — ED Notes (Signed)
Speech Therapist at the bedside.  ?

## 2021-11-22 NOTE — Care Management Obs Status (Signed)
MEDICARE OBSERVATION STATUS NOTIFICATION ? ? ?Patient Details  ?Name: Mercedes [name] ?MRN: [id number] ?Date of Birth: 1956/04/17 ? ? ?Medicare Observation Status Notification Given:  Yes ? ? ? ?Oletta Cohn, RN ?11/22/2021, 1:11 PM ?

## 2021-11-22 NOTE — ED Notes (Signed)
US at the bedside

## 2021-11-22 NOTE — ED Notes (Signed)
Notified provider that pt HR has been 57,56, and 58. ?

## 2021-11-22 NOTE — Evaluation (Signed)
Speech Language Pathology Evaluation ?Patient Details ?Name: Mercedes [name] ?MRN: [id number] ?DOB: 1956-06-30 ?Today's Date: 11/22/2021 ?Time: [id number] ?SLP Time Calculation (min) (ACUTE ONLY): 20 min ? ?Problem List:  ?Patient Active Problem List  ? Diagnosis Date Noted  ? Acute ischemic stroke ([place]) 11/21/2021  ? Pressure injury of left buttock, stage 1 11/09/2021  ? Ulceration of stump of above knee amputation ([place]) 11/09/2021  ? S/P AKA (above knee amputation), right [facility]), 11/23/20 12/14/2020  ? Protein-calorie malnutrition, severe 11/21/2020  ? Malnutrition of moderate degree 11/04/2020  ? Ischemia of extremity 10/29/2020  ? Depression 12/15/2019  ? S/P AKA (above knee amputation), left [facility]), 10/01/19 10/20/2019  ? Femoral-popliteal bypass graft occlusion, left ([place]) 09/25/2019  ? Peripheral arterial disease ([place]) 09/25/2019  ? Critical lower limb ischemia ([place]) 05/09/2019  ? 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 Diabetes ([place]) 04/01/2017  ? Hyperthyroidism 02/21/2017  ? Coronary artery disease involving native heart without angina pectoris   ? Hx of CABG 07/17/2016  ? Paroxysmal atrial fibrillation ([place]) 07/12/2016  ? NSTEMI (non-ST elevated myocardial infarction) ([place]) 07/11/2016  ? Phantom limb pain-Left great toe 04/29/2015  ? Seizure ([place])   ? Atrial flutter with rapid ventricular response vs. SVT 10/27/2014  ?  Class: Acute  ? Atherosclerosis of native artery of left lower extremity with rest pain ([place]) 12/18/2013  ? PAD (peripheral artery disease) ([place]) 12/02/2013  ? SLE (systemic lupus erythematosus) ([place])   ? CAD - S/P Takotsubo MI in 2000, CFX DES Sept 2014 05/25/2013  ? Acute MI, inferior wall ([place]) 05/19/2013  ? 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  ? Tachycardia-bradycardia ([place]) 03/09/2010  ? PVD- s/p Lt [facility] April 2015 10/29/2009  ? Dyslipidemia 08/26/2009  ? Essential hypertension 08/26/2009  ? CORONARY ATHEROSCLEROSIS NATIVE CORONARY ARTERY 08/26/2009  ? 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  Rheumatoid arthritis ([place]) 08/26/2009  ? ?Past Medical History:  ?Past Medical History:  ?Diagnosis Date  ? Abnormal LFTs   ? a. in the past when on Lipitor  ? Anemia   ? as a young woman  ? Anxiety   ? Arnold-Chiari malformation, type I ([place])   ? MRI brain 02/2010  ? CAD (coronary artery disease)   ? a. NSTEMI 07/2009: LHC - D2 40%, LAD irreg., EF 50% with apical AK (tako-tsubo CM);  b. Inf STEMI (04/2013): Tx Promus DES to CFX;  c. 08/2013 Lexi CL: No ischemia, dist ant/ap/inf-ap infarct, EF  d. 06/2016 NSTEMI with LM disease: s/p CABG on 07/17/2016 w/ LIMA-LAD, SVG-OM, and SVG-dRCA.  ? DEPRESSION   ? Dizziness   ? ? CVA 01/2010 - carotid dopplers with no ICA stenosis  ? GERD (gastroesophageal reflux disease)   ? Headache   ? History of transmetatarsal amputation of left foot ([place]) 07/17/2019  ? HYPERLIPIDEMIA   ? HYPERTENSION   ? MI (myocardial infarction) ([place])   ? PAD (peripheral artery disease) ([place])   ? s/p L fem pop 11/2013 in setting of 1st toe osteo/gangrene  ? Paroxysmal atrial fibrillation (H[facility])   ? a. anticoagulated with Eliquis 10/2014  ? Presence of permanent cardiac pacemaker 10/2014  ? leadless permanent pacemaker  ? RA (rheumatoid arthritis) ([place])   ? prior tx by Dr. Ouida Sills  ? Sjogren's syndrome ([place]) 06/02/13  ? pt denies this (12/01/13)  ? SLE (systemic lupus erythematosus) ([place]) dx 05/2013  ? follows with rheum Ouida Sills)  ? Tachy-brady syndrome ([place])   ? a. s/p STJ Leadless pacemaker 11-04-2014 Dr Rayann Heman  ? 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  f/u echo 09/2009: EF 50-55%, mild LVH, mod diast dysfxn, mild apical HK  ? Wears dentures   ? Wears glasses   ? ?Past Surgical History:  ?Past Surgical History:  ?Procedure Laterality Date  ? ABDOMINAL AORTAGRAM N/A 11/24/2013  ? Procedure: ABDOMINAL AORTAGRAM WITH BILATERAL RUNOFF WITH POSSIBLE INTERVENTION;  Surgeon: Angelia Mould, MD;  [facility];  Service: Cardiovascular;  Laterality: N/A;  ? ABDOMINAL AORTOGRAM W/LOWER EXTREMITY N/A 04/25/2019   ? Procedure: ABDOMINAL AORTOGRAM W/LOWER EXTREMITY;  Surgeon: Elam Dutch, MD;  Location: [place] CV LAB;  Service: Cardiovascular;  Laterality: N/A;  ? ABDOMINAL AORTOGRAM W/LOWER EXTREMITY Bilateral 05/15/2019  ? Procedure: ABDOMINAL AORTOGRAM W/LOWER EXTREMITY;  Surgeon: Serafina Mitchell, MD;  Location: [place] CV LAB;  Service: Cardiovascular;  Laterality: Bilateral;  ? ABDOMINAL AORTOGRAM W/LOWER EXTREMITY N/A 08/23/2020  ? Procedure: ABDOMINAL AORTOGRAM W/LOWER EXTREMITY;  Surgeon: Waynetta Sandy, MD;  Location: [place] CV LAB;  Service: Cardiovascular;  Laterality: N/A;  ? ABDOMINAL AORTOGRAM W/LOWER EXTREMITY N/A 10/29/2020  ? Procedure: ABDOMINAL AORTOGRAM W/LOWER EXTREMITY;  Surgeon: Elam Dutch, MD;  Location: [place] CV LAB;  Service: Cardiovascular;  Laterality: N/A;  ? AMPUTATION Left 12/02/2013  ? Procedure: LEFT 1ST TOE AMPUTATION;  Surgeon: Elam Dutch, MD;  Location: [place];  Service: Vascular;  Laterality: Left;  ? AMPUTATION Left 07/16/2019  ? Procedure: LEFT TRANSMETATARSAL AMPUTATION;  Surgeon: Newt Minion, MD;  Location: [place];  Service: Orthopedics;  Laterality: Left;  ? AMPUTATION Left 10/01/2019  ? Procedure: LEFT AMPUTATION ABOVE KNEE;  Surgeon: Elam Dutch, MD;  Location: [place];  Service: Vascular;  Laterality: Left;  ? AMPUTATION Right 08/31/2020  ? Procedure: Right first toe amputation;  Surgeon: Elam Dutch, MD;  Location: [place];  Service: Vascular;  Laterality: Right;  ? AMPUTATION Right 11/23/2020  ? Procedure: RIGHT AMPUTATION ABOVE KNEE;  Surgeon: Elam Dutch, MD;  Location: [facility] OR;  Service: Vascular;  Laterality: Right;  ? APPLICATION OF WOUND VAC Left 11/03/2019  ? Procedure: Application Of Wound Vac, left lower extremity;  Surgeon: Elam Dutch, MD;  Location: [place];  Service: Vascular;  Laterality: Left;  ? CARDIAC CATHETERIZATION N/A 07/12/2016  ? Procedure: Left Heart Cath and Coronary Angiography;  Surgeon: Troy Sine, MD;  Location: [place] CV LAB;  Service: Cardiovascular;  Laterality: N/A;  ? CARDIAC CATHETERIZATION N/A 07/14/2016  ? Procedure: Intravascular Ultrasound/IVUS;  Surgeon: Nelva Bush, MD;  Location: [place] CV LAB;  Service: Cardiovascular;  Laterality: N/A;  ? COLONOSCOPY    ? CORONARY ANGIOPLASTY  04/2013  ? CORONARY ARTERY BYPASS GRAFT N/A 07/17/2016  ? Procedure: CORONARY ARTERY BYPASS GRAFTING (CABG)x3 using right greater saphenous vein harvested endoscopiclly and left internal mammary artery;  Surgeon: Ivin Poot, MD;  Location: [place];  Service: Open Heart Surgery;  Laterality: N/A;  ? ENDARTERECTOMY FEMORAL Left 05/12/2019  ? Procedure: LEFT Femoral Endarterectomy;  Surgeon: Elam Dutch, MD;  Location: [facility] OR;  Service: Vascular;  Laterality: Left;  ? FEMORAL-POPLITEAL BYPASS GRAFT Left 12/02/2013  ? Procedure:  LEFT FEMORAL-BELOW KNEE POPLITEAL ARTERY BYPASS GRAFT;  Surgeon: Elam Dutch, MD;  Location: [place];  Service: Vascular;  Laterality: Left;  ? FEMORAL-POPLITEAL BYPASS GRAFT Left 09/25/2019  ? Procedure: THROMBECTOMY and PROPATEN BYPASS  FEMORAL TO BELOW KNEE POPLITEAL ARTERY BYPASS GRAFT LEFT LEG;  Surgeon: [name], MD;  Location: [place];  Service: Vascular;  Laterality: Left;  ?  FEMORAL-POPLITEAL BYPASS GRAFT Right 08/24/2020  ? Procedure: RIGHT FEMORAL-BELOW KNEE POPLITEAL ARTERY BYPASS USING GORE PROPATEN GRAFT;  Surgeon: Waynetta Sandy, MD;  Location: [name];  Service: Vascular;  Laterality: Right;  ? FEMORAL-POPLITEAL BYPASS GRAFT Right 11/01/2020  ? Procedure: Thrombectomy and Revision of Right Femoral to below knee Bypass with Interpostional graft to Tibial / peroneal Trunk and Endarterectomy Tibial Letitia Caul Trunk.;  Surgeon: Elam Dutch, MD;  Location: [facility] OR;  Service: Vascular;  Laterality: Right;  ? INTRAOPERATIVE ARTERIOGRAM Right 11/01/2020  ? Procedure: ANTEGRADE RIGHT LOWER EXTREMITY INTRA OPERATIVE ARTERIOGRAM;  Surgeon: Elam Dutch,  MD;  Location: [facility] OR;  Service: Vascular;  Laterality: Right;  ? LEFT HEART CATHETERIZATION WITH CORONARY ANGIOGRAM N/A 05/19/2013  ? Procedure: LEFT HEART CATHETERIZATION WITH CORONARY ANGIOGRAM;  Sur

## 2021-11-22 NOTE — ED Notes (Signed)
Pt eating lunch at the bedside. °

## 2021-11-22 NOTE — Care Management CC44 (Signed)
Condition Code 44 Documentation Completed ? ?Patient Details  ?Name: Mercedes [name] ?MRN: [id number] ?Date of Birth: 1956-01-10 ? ? ?Condition Code 44 given:  Yes ?Patient signature on Condition Code 44 notice:  Yes ?Documentation of 2 MD's agreement:  Yes ?Code 44 added to claim:  Yes ? ? ? ?Oletta Cohn, RN ?11/22/2021, 1:11 PM ? ?

## 2021-11-22 NOTE — Evaluation (Signed)
Clinical/Bedside Swallow Evaluation ?Patient Details  ?Name: Mercedes [name] ?MRN: [id number] ?Date of Birth: 01-20-1956 ? ?Today's Date: 11/22/2021 ?Time: SLP Start Time (ACUTE ONLY): 0850 SLP Stop Time (ACUTE ONLY): 0915 ?SLP Time Calculation (min) (ACUTE ONLY): 25 min ? ?Past Medical History:  ?Past Medical History:  ?Diagnosis Date  ? Abnormal LFTs   ? a. in the past when on Lipitor  ? Anemia   ? as a young woman  ? Anxiety   ? Arnold-Chiari malformation, type I (HCC)   ? MRI brain 02/2010  ? CAD (coronary artery disease)   ? a. NSTEMI 07/2009: LHC - D2 40%, LAD irreg., EF 50% with apical AK (tako-tsubo CM);  b. Inf STEMI (04/2013): Tx Promus DES to CFX;  c. 08/2013 Lexi CL: No ischemia, dist ant/ap/inf-ap infarct, EF  d. 06/2016 NSTEMI with LM disease: s/p CABG on 07/17/2016 w/ LIMA-LAD, SVG-OM, and SVG-dRCA.  ? DEPRESSION   ? Dizziness   ? ? CVA 01/2010 - carotid dopplers with no ICA stenosis  ? GERD (gastroesophageal reflux disease)   ? Headache   ? 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 HYPERLIPIDEMIA   ? HYPERTENSION   ? MI (myocardial infarction) (HCC)   ? PAD (peripheral artery disease) (HCC)   ? s/p L fem pop 11/2013 in setting of 1st toe osteo/gangrene  ? Paroxysmal atrial fibrillation (HCC)   ? a. anticoagulated with Eliquis 10/2014  ? Presence of permanent cardiac pacemaker 10/2014  ? leadless permanent pacemaker  ? RA (rheumatoid arthritis) (HCC)   ? prior tx by Dr. Dareen Piano  ? Sjogren's syndrome (HCC) 06/02/13  ? pt denies this (12/01/13)  ? SLE (systemic lupus erythematosus) (HCC) dx 05/2013  ? follows with rheum Dareen Piano)  ? Tachy-brady syndrome (HCC)   ? a. s/p STJ Leadless pacemaker 11-04-2014 Dr Johney Frame  ? 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 f/u echo 09/2009: EF 50-55%, mild LVH, mod diast dysfxn, mild apical HK  ? Wears dentures   ? Wears glasses   ? ?Past Surgical History:  ?Past Surgical History:  ?Procedure Laterality Date  ? ABDOMINAL AORTAGRAM N/A 11/24/2013  ?  Procedure: ABDOMINAL AORTAGRAM WITH BILATERAL RUNOFF WITH POSSIBLE INTERVENTION;  Surgeon: Chuck Hint, MD;  Location: [facility] CATH LAB;  Service: Cardiovascular;  Laterality: N/A;  ? ABDOMINAL AORTOGRAM W/LOWER EXTREMITY N/A 04/25/2019  ? Procedure: ABDOMINAL AORTOGRAM W/LOWER EXTREMITY;  Surgeon: Sherren Kerns, MD;  Location: [facility] INVASIVE CV LAB;  Service: Cardiovascular;  Laterality: N/A;  ? ABDOMINAL AORTOGRAM W/LOWER EXTREMITY Bilateral 05/15/2019  ? Procedure: ABDOMINAL AORTOGRAM W/LOWER EXTREMITY;  Surgeon: Nada Libman, MD;  Location: [facility] INVASIVE CV LAB;  Service: Cardiovascular;  Laterality: Bilateral;  ? ABDOMINAL AORTOGRAM W/LOWER EXTREMITY N/A 08/23/2020  ? Procedure: ABDOMINAL AORTOGRAM W/LOWER EXTREMITY;  Surgeon: Maeola Harman, MD;  Location: [facility] INVASIVE CV LAB;  Service: Cardiovascular;  Laterality: N/A;  ? ABDOMINAL AORTOGRAM W/LOWER EXTREMITY N/A 10/29/2020  ? Procedure: ABDOMINAL AORTOGRAM W/LOWER EXTREMITY;  Surgeon: Sherren Kerns, MD;  Location: [facility] INVASIVE CV LAB;  Service: Cardiovascular;  Laterality: N/A;  ? AMPUTATION Left 12/02/2013  ? Procedure: LEFT 1ST TOE AMPUTATION;  Surgeon: Sherren Kerns, MD;  Location: [facility] OR;  Service: Vascular;  Laterality: Left;  ? AMPUTATION Left 07/16/2019  ? Procedure: LEFT TRANSMETATARSAL AMPUTATION;  Surgeon: Nadara Mustard, MD;  Location: [facility] OR;  Service: Orthopedics;  Laterality: Left;  ? AMPUTATION Left 10/01/2019  ? Procedure: LEFT AMPUTATION ABOVE KNEE;  Surgeon: Sherren Kerns, MD;  Location: [facility] OR;  Service: Vascular;  Laterality: Left;  ? AMPUTATION Right 08/31/2020  ? Procedure: Right first toe amputation;  Surgeon: Sherren Kerns, MD;  Location: [facility] OR;  Service: Vascular;  Laterality: Right;  ? AMPUTATION Right 11/23/2020  ? Procedure: RIGHT AMPUTATION ABOVE KNEE;  Surgeon: Sherren Kerns, MD;  Location: [facility] OR;  Service: Vascular;  Laterality: Right;  ? APPLICATION OF WOUND VAC Left 11/03/2019  ? Procedure: Application Of  Wound Vac, left lower extremity;  Surgeon: Sherren Kerns, MD;  Location: [facility] OR;  Service: Vascular;  Laterality: Left;  ? CARDIAC CATHETERIZATION N/A 07/12/2016  ? Procedure: Left Heart Cath and Coronary Angiography;  Surgeon: Lennette Bihari, MD;  Location: [facility] INVASIVE CV LAB;  Service: Cardiovascular;  Laterality: N/A;  ? CARDIAC CATHETERIZATION N/A 07/14/2016  ? Procedure: Intravascular Ultrasound/IVUS;  Surgeon: Yvonne Kendall, MD;  Location: [facility] INVASIVE CV LAB;  Service: Cardiovascular;  Laterality: N/A;  ? COLONOSCOPY    ? CORONARY ANGIOPLASTY  04/2013  ? CORONARY ARTERY BYPASS GRAFT N/A 07/17/2016  ? Procedure: CORONARY ARTERY BYPASS GRAFTING (CABG)x3 using right greater saphenous vein harvested endoscopiclly and left internal mammary artery;  Surgeon: Kerin Perna, MD;  Location: [facility] OR;  Service: Open Heart Surgery;  Laterality: N/A;  ? ENDARTERECTOMY FEMORAL Left 05/12/2019  ? Procedure: LEFT Femoral Endarterectomy;  Surgeon: Sherren Kerns, MD;  Location: [facility] OR;  Service: Vascular;  Laterality: Left;  ? FEMORAL-POPLITEAL BYPASS GRAFT Left 12/02/2013  ? Procedure:  LEFT FEMORAL-BELOW KNEE POPLITEAL ARTERY BYPASS GRAFT;  Surgeon: Sherren Kerns, MD;  Location: [facility] OR;  Service: Vascular;  Laterality: Left;  ? FEMORAL-POPLITEAL BYPASS GRAFT Left 09/25/2019  ? Procedure: THROMBECTOMY and PROPATEN BYPASS  FEMORAL TO BELOW KNEE POPLITEAL ARTERY BYPASS GRAFT LEFT LEG;  Surgeon: Larina Earthly, MD;  Location: [facility] OR;  Service: Vascular;  Laterality: Left;  ? FEMORAL-POPLITEAL BYPASS GRAFT Right 08/24/2020  ? Procedure: RIGHT FEMORAL-BELOW KNEE POPLITEAL ARTERY BYPASS USING GORE PROPATEN GRAFT;  Surgeon: Maeola Harman, MD;  Location: [facility] OR;  Service: Vascular;  Laterality: Right;  ? FEMORAL-POPLITEAL BYPASS GRAFT Right 11/01/2020  ? Procedure: Thrombectomy and Revision of Right Femoral to below knee Bypass with Interpostional graft to Tibial / peroneal Trunk and Endarterectomy Tibial Weston Settle Trunk.;   Surgeon: Sherren Kerns, MD;  Location: [facility] OR;  Service: Vascular;  Laterality: Right;  ? INTRAOPERATIVE ARTERIOGRAM Right 11/01/2020  ? Procedure: ANTEGRADE RIGHT LOWER EXTREMITY INTRA OPERATIVE ARTERIOGRAM;  Surgeon: Sherren Kerns, MD;  Location: [facility] OR;  Service: Vascular;  Laterality: Right;  ? LEFT HEART CATHETERIZATION WITH CORONARY ANGIOGRAM N/A 05/19/2013  ? Procedure: LEFT HEART CATHETERIZATION WITH CORONARY ANGIOGRAM;  Surgeon: Micheline Chapman, MD;  Location: [facility] CATH LAB;  Service: Cardiovascular;  Laterality: N/A;  ? LEFT HEART CATHETERIZATION WITH CORONARY ANGIOGRAM N/A 10/28/2014  ? Procedure: LEFT HEART CATHETERIZATION WITH CORONARY ANGIOGRAM;  Surgeon: Iran Ouch, MD;  Location: [facility] CATH LAB;  Service: Cardiovascular;  Laterality: N/A;  ? LOWER EXTREMITY ANGIOGRAM Left 05/12/2019  ? Procedure: Lower Extremity Angiogram;  Surgeon: Sherren Kerns, MD;  Location: [facility] OR;  Service: Vascular;  Laterality: Left;  ? PATCH ANGIOPLASTY Left 05/12/2019  ? Procedure: Left Femoral Patch Angioplasty USING CADAVERIC SAPHENOUS VEIN;  Surgeon: Sherren Kerns, MD;  Location: [facility] OR;  Service: Vascular;  Laterality: Left;  ? PERCUTANEOUS CORONARY STENT INTERVENTION (PCI-S)  05/19/2013  ? Procedure: PERCUTANEOUS CORONARY STENT INTERVENTION (PCI-S);  Surgeon: Micheline Chapman, MD;  Location: [facility] CATH  LAB;  Service: Cardiovascular;;  ? PERIPHERAL VASCULAR INTERVENTION Right 10/29/2020  ? Procedure: PERIPHERAL VASCULAR INTERVENTION;  Surgeon: Sherren KernsFields, Charles E, MD;  [facility];  Service: Cardiovascular;  Laterality: Right;  common iliac  ? PERMANENT PACEMAKER INSERTION N/A 11/04/2014  ? STJ Leadless pacemaker implanted by Dr Johney FrameAllred for tachy/brady  ? TEE WITHOUT CARDIOVERSION N/A 07/17/2016  ? Procedure: TRANSESOPHAGEAL ECHOCARDIOGRAM (TEE);  Surgeon: Kerin PernaPeter Van Trigt, MD;  [facility];  Service: Open Heart Surgery;  Laterality: N/A;  ? THROMBECTOMY FEMORAL ARTERY Left 05/12/2019  ? Procedure: Left  Ilio-Femoral Artery and Femoral-popliteal Graft Thrombectomy;  Surgeon: Sherren KernsFields, Charles E, MD;  [facility];  Service: Vascular;  Laterality: Left;  ? THROMBECTOMY OF BYPASS GRAFT FEMORAL- POPLITEAL ARTER

## 2021-11-22 NOTE — Evaluation (Signed)
Occupational Therapy Evaluation ?Patient Details ?Name: Mercedes [name] ?MRN: [id number] ?DOB: [birth year] ?Today's Date: 11/22/2021 ? ? ?History of Present Illness Pt is a 66 yo female presenting with 12 day hx of difficulty speaking, R UE weakness. CT with 1.5 cm hypodensity involving the L frontal centrum semi ovale, age indeterminate.  PMH: B AKA, takotsubo CM, SLE, RA, PAD, MI, HTN, CAD.  ? ?Clinical Impression ?  ?PTA patient reports independent with ADLs, transfers and IADLs from [facility] level.  Patient reports having support from grandkids as needed.  Admitted for above and limited by problem list below including R sided weakness and decreased activity tolerance. She currently requires min guard to close supervision for transfers (simulated a/p on stretcher), up to min guard for ADLs.  Educated on completion of exercises to R UE and maximize functional use to increase strength. She will benefit from further OT services acutely and after dc at OP OT level.  Will follow.  ?   ? ?Recommendations for follow up therapy are one component of a multi-disciplinary discharge planning process, led by the attending physician.  Recommendations may be updated based on patient status, additional functional criteria and insurance authorization.  ? ?Follow Up Recommendations ? Outpatient OT  ?  ?Assistance Recommended at Discharge Intermittent Supervision/Assistance  ?Patient can return home with the following A little help with walking and/or transfers;A little help with bathing/dressing/bathroom;Assistance with cooking/housework;Assist for transportation ? ?  ?Functional Status Assessment ? Patient has had a recent decline in their functional status and demonstrates the ability to make significant improvements in function in a reasonable and predictable amount of time.  ?Equipment Recommendations ? None recommended by OT  ?  ?Recommendations for Other Services   ? ? ?  ?Precautions / Restrictions Precautions ?Precautions:  Fall ?Precaution Comments: B AKA, wound on L residual limb ?Restrictions ?Weight Bearing Restrictions: No  ? ?  ? ?Mobility Bed Mobility ?Overal bed mobility: Modified Independent ?  ?  ?  ?  ?  ?  ?  ?  ? ?Transfers ?  ?  ?  ?  ?  ?  ?  ?  ?  ?  ?  ? ?  ?Balance Overall balance assessment: Needs assistance ?Sitting-balance support: No upper extremity supported, Feet supported ?Sitting balance-Leahy Scale: Good ?  ?  ?  ?  ?  ?  ?  ?  ?  ?  ?  ?  ?  ?  ?  ?  ?   ? ?ADL either performed or assessed with clinical judgement  ? ?ADL Overall ADL's : Needs assistance/impaired ?  ?  ?Grooming: Modified independent;Sitting ?  ?  ?  ?  ?  ?Upper Body Dressing : Set up;Sitting ?  ?Lower Body Dressing: Supervision/safety;Sitting/lateral leans ?  ?Toilet Transfer: Min guard;Anterior/posterior ?Toilet Transfer Details (indicate cue type and reason): simulated scooting on bed ?  ?  ?  ?  ?Functional mobility during ADLs: Min guard ?General ADL Comments: min guard for safety  ? ? ? ?Vision   ?Vision Assessment?: No apparent visual deficits  ?   ?Perception   ?  ?Praxis   ?  ? ?Pertinent Vitals/Pain Pain Assessment ?Pain Assessment: Faces ?Faces Pain Scale: Hurts a little bit ?Pain Location: L AKA ?Pain Descriptors / Indicators: Aching, Grimacing ?Pain Intervention(s): Limited activity within patient's tolerance, Monitored during session, Repositioned  ? ? ? ?Hand Dominance Right ?  ?Extremity/Trunk Assessment Upper Extremity Assessment ?Upper Extremity Assessment: RUE deficits/detail ?RUE Deficits /  Details: grossly 3+/5 compared to 4+/5 MMT ?RUE Sensation: WNL ?RUE Coordination: WNL ?  ?Lower Extremity Assessment ?Lower Extremity Assessment: Defer to PT evaluation ?  ?  ?  ?Communication Communication ?Communication: No difficulties ?  ?Cognition Arousal/Alertness: Awake/alert ?Behavior During Therapy: [facility] for tasks assessed/performed ?Overall Cognitive Status: Within Functional Limits for tasks assessed ?  ?  ?  ?  ?  ?  ?  ?  ?   ?  ?  ?  ?  ?  ?  ?  ?  ?  ?  ?General Comments  educated on exercises to R UE, encouraged maximal functional use ? ?  ?Exercises   ?  ?Shoulder Instructions    ? ? ?Home Living Family/patient expects to be discharged to:: Private residence ?Living Arrangements: Alone ?Available Help at Discharge: Family (grandchildren) ?Type of Home: House ?Home Access: Ramped entrance ?  ?  ?Home Layout: One level ?  ?  ?Bathroom Shower/Tub: Tub/shower unit ?  ?Bathroom Toilet: Standard ?  ?  ?Home Equipment: Wheelchair - manual;Tub bench;Wheelchair - power;BSC/3in1 ?  ?  ? Lives With: Alone ? ?  ?Prior Functioning/Environment Prior Level of Function : Independent/Modified Independent ?  ?  ?  ?  ?  ?  ?Mobility Comments: Reports independence with transfers to/from [facility] ?ADLs Comments: Reports independence with transfer to tub bench and toilet ?  ? ?  ?  ?OT Problem List: Decreased strength;Decreased activity tolerance;Impaired balance (sitting and/or standing) ?  ?   ?OT Treatment/Interventions: Self-care/ADL training;Neuromuscular education;DME and/or AE instruction;Therapeutic activities;Patient/family education  ?  ?OT Goals(Current goals can be found in the care plan section) Acute Rehab OT Goals ?Patient Stated Goal: home ?OT Goal Formulation: With patient ?Time For Goal Achievement: 12/06/21 ?Potential to Achieve Goals: Good  ?OT Frequency: Min 2X/week ?  ? ?Co-evaluation PT/OT/SLP Co-Evaluation/Treatment: Yes ?Reason for Co-Treatment: To address functional/ADL transfers;For patient/therapist safety ?  ?OT goals addressed during session: ADL's and self-care ?  ? ?  ?AM-PAC OT "6 Clicks" Daily Activity     ?Outcome Measure Help from another person eating meals?: None ?Help from another person taking care of personal grooming?: None ?Help from another person toileting, which includes using toliet, bedpan, or urinal?: A Little ?Help from another person bathing (including washing, rinsing, drying)?: A Little ?Help from another  person to put on and taking off regular upper body clothing?: A Little ?Help from another person to put on and taking off regular lower body clothing?: A Little ?6 Click Score: 20 ?  ?End of Session Nurse Communication: Mobility status ? ?Activity Tolerance: Patient tolerated treatment well ?Patient left: with call bell/phone within reach;in bed ? ?OT Visit Diagnosis: Other abnormalities of gait and mobility (R26.89);Muscle weakness (generalized) (M62.81)  ?              ?Time: 5038-8828 ?OT Time Calculation (min): 17 min ?Charges:  OT General Charges ?$OT Visit: 1 Visit ?OT Evaluation ?$OT Eval Moderate Complexity: 1 Mod ? ?Barry Brunner, OT ?Acute Rehabilitation Services ?Pager [phone] ?Office [phone] ? ? ?Chancy Milroy ?11/22/2021, 12:17 PM ?

## 2021-11-22 NOTE — Assessment & Plan Note (Signed)
?  ?#)   Essential Hypertension: documented h/o such, with outpatient antihypertensive regimen including metoprolol tartrate.  SBP's in the ED today: In the 130s to 150s mmHg.  Patient presents outside of window for observance of permissive hypertension given the timing of onset of her articulation issues starting 12 days ago.  Therefore, will resume home beta-blocker at this time. ?  ?Plan: Close monitoring of subsequent BP via routine VS. resume home metoprolol tartrate. ?  ?  ?  ?

## 2021-11-22 NOTE — Evaluation (Signed)
Physical Therapy Evaluation ?Patient Details ?Name: Mercedes [name] ?MRN: [id number] ?DOB: [birth year] ?Today's Date: 11/22/2021 ? ?History of Present Illness ? Pt is a 66 yo female presenting with 12 day hx of difficulty speaking, R UE weakness. CT with 1.5 cm hypodensity involving the L frontal centrum semi ovale, age indeterminate.  PMH: B AKA, takotsubo CM, SLE, RA, PAD, MI, HTN, CAD.  ?Clinical Impression ? Pt admitted secondary to problem above with deficits below. Pt overall mod I with bed mobility and supervision to perform AP transfers along length of stretcher. Pt demonstrating good trunk control throughout. Recommending outpatient PT to address current deficits. Will continue to follow acutely.  ?   ? ?Recommendations for follow up therapy are one component of a multi-disciplinary discharge planning process, led by the attending physician.  Recommendations may be updated based on patient status, additional functional criteria and insurance authorization. ? ?Follow Up Recommendations Outpatient PT ? ?  ?Assistance Recommended at Discharge Intermittent Supervision/Assistance  ?Patient can return home with the following ? A little help with walking and/or transfers;Assistance with cooking/housework;Assist for transportation ? ?  ?Equipment Recommendations None recommended by PT  ?Recommendations for Other Services ?    ?  ?Functional Status Assessment Patient has had a recent decline in their functional status and demonstrates the ability to make significant improvements in function in a reasonable and predictable amount of time.  ? ?  ?Precautions / Restrictions Precautions ?Precautions: Fall ?Precaution Comments: B AKA, wound on L residual limb ?Restrictions ?Weight Bearing Restrictions: No  ? ?  ? ?Mobility ? Bed Mobility ?Overal bed mobility: Modified Independent ?  ?  ?  ?  ?  ?  ?  ?  ? ?Transfers ?Overall transfer level: Needs assistance ?Equipment used: None ?Transfers: Bed to chair/wheelchair/BSC ?  ?   ?  ?  ?Anterior-Posterior transfers: Supervision ?  ?General transfer comment: Pt able to perform AP scooting on flat stretcher with close supervision. Demonstrated good trunk stability throughout. Unable to transfer to chair as pt on stretcher. ?  ? ?Ambulation/Gait ?  ?  ?  ?  ?  ?  ?  ?  ? ?Stairs ?  ?  ?  ?  ?  ? ?Wheelchair Mobility ?  ? ?Modified Rankin (Stroke Patients Only) ?  ? ?  ? ?Balance Overall balance assessment: Needs assistance ?Sitting-balance support: No upper extremity supported, Feet supported ?Sitting balance-Leahy Scale: Good ?  ?  ?  ?  ?  ?  ?  ?  ?  ?  ?  ?  ?  ?  ?  ?  ?   ? ? ? ?Pertinent Vitals/Pain Pain Assessment ?Pain Assessment: Faces ?Faces Pain Scale: Hurts a little bit ?Pain Location: L AKA wound ?Pain Descriptors / Indicators: Aching, Grimacing ?Pain Intervention(s): Limited activity within patient's tolerance, Monitored during session, Repositioned  ? ? ?Home Living Family/patient expects to be discharged to:: Private residence ?Living Arrangements: Alone ?Available Help at Discharge: Family (grandchildren) ?Type of Home: House ?Home Access: Ramped entrance ?  ?  ?  ?Home Layout: One level ?Home Equipment: Wheelchair - manual;Tub bench;Wheelchair - power;BSC/3in1 ?   ?  ?Prior Function Prior Level of Function : Independent/Modified Independent ?  ?  ?  ?  ?  ?  ?Mobility Comments: Reports independence with transfers to/from [facility] ?ADLs Comments: Reports independence with transfer to tub bench and toilet ?  ? ? ?Hand Dominance  ? Dominant Hand: Right ? ?  ?Extremity/Trunk Assessment  ?  Upper Extremity Assessment ?Upper Extremity Assessment: Defer to OT evaluation ?RUE Deficits / Details: grossly 3+/5 compared to 4+/5 MMT ?RUE Sensation: WNL ?RUE Coordination: WNL ?  ? ?Lower Extremity Assessment ?Lower Extremity Assessment: RLE deficits/detail;LLE deficits/detail ?RLE Deficits / Details: R AKA at baseline. Grossly 3/5 with hip flexion ?LLE Deficits / Details: L AKA at baseline.  Wound at tip of residual limb ?  ? ?   ?Communication  ? Communication: No difficulties  ?Cognition Arousal/Alertness: Awake/alert ?Behavior During Therapy: [facility] for tasks assessed/performed ?Overall Cognitive Status: Within Functional Limits for tasks assessed ?  ?  ?  ?  ?  ?  ?  ?  ?  ?  ?  ?  ?  ?  ?  ?  ?  ?  ?  ? ?  ?General Comments General comments (skin integrity, edema, etc.): educated on exercises to R UE, encouraged maximal functional use ? ?  ?Exercises    ? ?Assessment/Plan  ?  ?PT Assessment Patient needs continued PT services  ?PT Problem List Decreased strength;Decreased activity tolerance;Decreased mobility ? ?   ?  ?PT Treatment Interventions DME instruction;Functional mobility training;Therapeutic activities;Therapeutic exercise;Balance training;Patient/family education;Wheelchair mobility training   ? ?PT Goals (Current goals can be found in the Care Plan section)  ?Acute Rehab PT Goals ?Patient Stated Goal: to go home ?PT Goal Formulation: With patient ?Time For Goal Achievement: 12/06/21 ?Potential to Achieve Goals: Good ? ?  ?Frequency Min 4X/week ?  ? ? ?Co-evaluation PT/OT/SLP Co-Evaluation/Treatment: Yes ?Reason for Co-Treatment: To address functional/ADL transfers;For patient/therapist safety ?PT goals addressed during session: Mobility/safety with mobility;Balance ?OT goals addressed during session: ADL's and self-care ?  ? ? ?  ?AM-PAC PT "6 Clicks" Mobility  ?Outcome Measure Help needed turning from your back to your side while in a flat bed without using bedrails?: None ?Help needed moving from lying on your back to sitting on the side of a flat bed without using bedrails?: None ?Help needed moving to and from a bed to a chair (including a wheelchair)?: A Little ?Help needed standing up from a chair using your arms (e.g., wheelchair or bedside chair)?: Total ?Help needed to walk in hospital room?: Total ?Help needed climbing 3-5 steps with a railing? : Total ?6 Click Score: 14 ? ?   ?End of Session   ?Activity Tolerance: Patient tolerated treatment well ?Patient left: in bed;with call bell/phone within reach (on stretcher in ED) ?Nurse Communication: Mobility status ?PT Visit Diagnosis: Muscle weakness (generalized) (M62.81);Other abnormalities of gait and mobility (R26.89) ?  ? ?Time: [id number] ?PT Time Calculation (min) (ACUTE ONLY): 17 min ? ? ?Charges:   PT Evaluation ?$PT Eval Low Complexity: 1 Low ?  ?  ?   ? ? ?Reuel Derby, PT, DPT  ?Acute Rehabilitation Services  ?Pager: [phone] ?Office: [phone] ? ? ?[place] ?11/22/2021, 1:30 PM ?

## 2021-11-23 DIAGNOSIS — T8789 Other complications of amputation stump: Secondary | ICD-10-CM | POA: Diagnosis not present

## 2021-11-23 DIAGNOSIS — L89321 Pressure ulcer of left buttock, stage 1: Secondary | ICD-10-CM | POA: Diagnosis not present

## 2021-11-23 DIAGNOSIS — I10 Essential (primary) hypertension: Secondary | ICD-10-CM | POA: Diagnosis not present

## 2021-11-23 DIAGNOSIS — I639 Cerebral infarction, unspecified: Secondary | ICD-10-CM | POA: Diagnosis not present

## 2021-11-23 DIAGNOSIS — L97109 Non-pressure chronic ulcer of unspecified thigh with unspecified severity: Secondary | ICD-10-CM | POA: Diagnosis not present

## 2021-11-23 MED ORDER — KETOROLAC TROMETHAMINE 15 MG/ML IJ SOLN
15.0000 mg | Freq: Once | INTRAMUSCULAR | Status: AC
  Administered 2021-11-23: 15 mg via INTRAVENOUS
  Filled 2021-11-23: qty 1

## 2021-11-23 MED ORDER — ACETAMINOPHEN 500 MG PO TABS: 500.0000 mg | ORAL_TABLET | Freq: Three times a day (TID) | ORAL | Status: DC | PRN

## 2021-11-23 NOTE — Plan of Care (Signed)
?  Problem: Education: ?Goal: Knowledge of General Education information will improve ?Description: Including pain rating scale, medication(s)/side effects and non-pharmacologic comfort measures ?Outcome: Progressing ?  ?Problem: Clinical Measurements: ?Goal: Cardiovascular complication will be avoided ?Outcome: Progressing ?  ?Problem: Nutrition: ?Goal: Adequate nutrition will be maintained ?Outcome: Progressing ?  ?Problem: Coping: ?Goal: Level of anxiety will decrease ?Outcome: Progressing ?  ?Problem: Pain Managment: ?Goal: General experience of comfort will improve ?Outcome: Progressing ?  ?Problem: Safety: ?Goal: Ability to remain free from injury will improve ?Outcome: Progressing ?  ?Problem: Education: ?Goal: Knowledge of secondary prevention will improve ([facility]) ?Outcome: Progressing ?  ?Problem: Nutrition: ?Goal: Risk of aspiration will decrease ?Outcome: Progressing ?  ?

## 2021-11-23 NOTE — Consult Note (Signed)
WOC consult requested for stump wound.  This was performed on 3/28; please refer to previous progress notes for assessment and measurements, and topical treatment orders have been provided for bedside nurses to perform. ?Please re-consult if further assistance is needed.  Thank-you,  ?Cammie Mcgee MSN, RN, CWOCN, [place], CNS ?[phone]  ?

## 2021-11-23 NOTE — TOC Transition Note (Addendum)
Transition of Care (TOC) - CM/SW Discharge Note ? ? ?Patient Details  ?Name: Mercedes [name] ?MRN: [id number] ?Date of Birth: Mar 07, 1956 ? ?Transition of Care (TOC) CM/SW Contact:  ?Pollie Friar, RN ?Phone Number: ?11/23/2021, 10:51 AM ? ? ?Clinical Narrative:    ?Patient is from home alone. She does have siblings that live in [place] that can provide some intermittent supervision. CM called and spoke to patient sister and she states the patients daughter is headed to [facility] to stay with the patient for a few days.  ?DME: wheelchair, shower seat ?Patient is responsible for her own medications at home and she denies any issues. She states her neighbor picks up her medications from the pharmacy.  ?Pt uses [facility] transport for her MD appointments and says the MD office schedules the rides.  ? ?  ?1600: patient has decided to have home health services. CM has arranged this through [facility]. Information on the AVS. Pt needing [facility] transport home. This was arranged per CSW. Patient to update family on timing so they can let her into the home.  ? ? ?Final next level of care: OP Rehab ?Barriers to Discharge: No Barriers Identified ? ? ?Patient Goals and CMS Choice ?  ?  ?Choice offered to / list presented to : Patient ? ?Discharge Placement ?  ?           ?  ?  ?  ?  ? ?Discharge Plan and Services ?  ?  ?           ?  ?  ?  ?  ?  ?  ?  ?  ?  ?  ? ?Social Determinants of Health (SDOH) Interventions ?  ? ? ?Readmission Risk Interventions ? ?  09/08/2020  ? 12:03 PM 10/06/2019  ?  3:08 PM 05/16/2019  ?  2:17 PM  ?Readmission Risk Prevention Plan  ?Transportation Screening Complete Complete Complete  ?PCP or Specialist Appt within 5-7 Days Complete Complete Complete  ?Home Care Screening Complete Complete Complete  ?Medication Review (RN CM) Complete Complete Complete  ? ? ? ? ? ?

## 2021-11-23 NOTE — Plan of Care (Signed)
Patient ID: CYTLALY NORUM, female   DOB: 1956-05-17, 66 y.o.   MRN: [id number] ? ?Problem: Education: ?Goal: Knowledge of General Education information will improve ?Description: Including pain rating scale, medication(s)/side effects and non-pharmacologic comfort measures ?Outcome: Adequate for Discharge ?  ?Problem: Health Behavior/Discharge Planning: ?Goal: Ability to manage health-related needs will improve ?Outcome: Adequate for Discharge ?  ?Problem: Clinical Measurements: ?Goal: Ability to maintain clinical measurements within normal limits will improve ?Outcome: Adequate for Discharge ?Goal: Will remain free from infection ?Outcome: Adequate for Discharge ?Goal: Diagnostic test results will improve ?Outcome: Adequate for Discharge ?Goal: Cardiovascular complication will be avoided ?Outcome: Adequate for Discharge ?  ?Problem: Activity: ?Goal: Risk for activity intolerance will decrease ?Outcome: Adequate for Discharge ?  ?Problem: Nutrition: ?Goal: Adequate nutrition will be maintained ?Outcome: Adequate for Discharge ?  ?Problem: Coping: ?Goal: Level of anxiety will decrease ?Outcome: Adequate for Discharge ?  ?Problem: Elimination: ?Goal: Will not experience complications related to bowel motility ?Outcome: Adequate for Discharge ?Goal: Will not experience complications related to urinary retention ?Outcome: Adequate for Discharge ?  ?Problem: Pain Managment: ?Goal: General experience of comfort will improve ?Outcome: Adequate for Discharge ?  ?Problem: Safety: ?Goal: Ability to remain free from injury will improve ?Outcome: Adequate for Discharge ?  ?Problem: Skin Integrity: ?Goal: Risk for impaired skin integrity will decrease ?Outcome: Adequate for Discharge ?  ?Problem: Education: ?Goal: Knowledge of disease or condition will improve ?Outcome: Adequate for Discharge ?Goal: Knowledge of secondary prevention will improve (SELECT ALL) ?Outcome: Adequate for Discharge ?Goal: Knowledge of patient specific  risk factors will improve (INDIVIDUALIZE FOR PATIENT) ?Outcome: Adequate for Discharge ?Goal: Individualized Educational Video(s) ?Outcome: Adequate for Discharge ?  ?Problem: Self-Care: ?Goal: Ability to participate in self-care as condition permits will improve ?Outcome: Adequate for Discharge ?Goal: Verbalization of feelings and concerns over difficulty with self-care will improve ?Outcome: Adequate for Discharge ?Goal: Ability to communicate needs accurately will improve ?Outcome: Adequate for Discharge ?  ?Problem: Nutrition: ?Goal: Risk of aspiration will decrease ?Outcome: Adequate for Discharge ?Goal: Dietary intake will improve ?Outcome: Adequate for Discharge ?  ?Problem: Ischemic Stroke/TIA Tissue Perfusion: ?Goal: Complications of ischemic stroke/TIA will be minimized ?Outcome: Adequate for Discharge ?  ? ?Haydee Salter, RN ? ?

## 2021-11-23 NOTE — Discharge Summary (Addendum)
Physician Discharge Summary  ?Mercedes [name] [id number] DOB: 09/09/1955 DOA: 11/21/2021 ? ?PCP: Binnie Rail, MD ? ?Admit date: 11/21/2021 ?Discharge date: 11/24/2021 ? ?Time spent: 40 minutes ? ?Recommendations for Outpatient Follow-up:  ?Follow-up with the primary care physician in 1 to 2 weeks ?Follow-up with neurology 2 to 4 weeks ? ? ?Discharge Diagnoses:  ?Principal Problem: ?  Acute ischemic stroke ([place]) ?Active Problems: ?  Dyslipidemia ?  Essential hypertension ?  Paroxysmal atrial fibrillation (H[facility]) ?  Acute CVA (cerebrovascular accident) ([place]) ? ? ?Discharge Condition: Stable ? ? ?Filed Weights  ? 11/24/21 0500  ?Weight: 42.9 kg  ? ? ?History of present illness:  ?Chief Complaint: difficulty speaking ?  ?HPI: Mercedes [name] is a 66 y.o. female with medical history significant for essential hypertension, hyperlipidemia, peripheral artery disease status post bilateral AKA's paroxysmal atrial fibrillation chronically anticoagulated on Eliquis, sick sinus syndrome status post pacemaker placement, who is admitted to [facility] on 11/21/2021 with suspected acute ischemic CVA after presenting from home to [facility] ED complaining of difficulty speaking.  ?  ?The patient reports that 12 days ago she awoke with a severe headache as well as new onset speech difficulty.  She reports that she is able to correctly identify the words that she wishes to speak, but experiences difficulty with articulation of those words.  She reports that this issue with articulation has been constant since onset 12 days ago.  She notes associated relative difficulty with swallowing since that time as well.  She reports that this may have also been associated with some right upper extremity weakness with the same timing of onset, but notes that this is subsequently improved back to baseline. ?  ?Otherwise, she denies any associated acute focal weakness, numbness, paresthesias, dizziness, vertigo, nausea, vomiting, acute change in  vision, facial droop.  She also notes interval resolution of her headache.  No preceding trauma.  Denies any associated chest pain, shortness of breath, palpitations, diaphoresis, presyncope, or syncope. ?  ?Denies any previous known history of stroke.  Medical history notable for essential hypertension with outpatient hypertensive regimen limited to the Lopressor.  She also has a history of hyperlipidemia for which she reports good compliance on high intensity rosuvastatin.  Denies any known history of underlying diabetes.  Confirms a history of paroxysmal atrial fibrillation for which she is on chronic anticoagulation via Eliquis.  Otherwise, no blood thinners as an outpatient.  She is a former smoker, having completely quit smoking back in 2019 without subsequent resumption.  Denies any known history of underlying obstructive sleep apnea. ?  ?  ?  ?ED Course:  ?Vital signs in the ED were notable for the following: Afebrile; heart rate 66-82; blood pressure 133/79 - 150/86 mmHg; respiratory rate 14-19, oxygen saturation 95 to 100% on room air. ?  ?Labs were notable for the following: CMP notable for the following: Sodium 135, creatinine 0.78, glucose 112.  CBC notable for white blood cell count 5600, hemoglobin 12.6, platelet count 497.  INR 1.0.  Urinalysis showed no white blood cells.  Serum ethanol level less than 10. ?  ?Imaging and additional notable ED work-up: EKG shows sinus rhythm with heart rate 70, was read as demonstrating right bundle branch block and left anterior fascicular block, she has nonspecific T wave inversion in aVL, no evidence of ST changes, including no evidence of ST elevation. ?  ?Noncontrast CT head shows 1.5 cm hypodensity involving the left frontal centrum 70 ovale, felt to be  somewhat indeterminate but favored to be chronic,; otherwise, no evidence of acute intracranial process, including no evidence of intracranial hemorrhage.  CT head/neck has been completed, with result/read  currently pending. ?  ?Case was discussed with on-call neurology, Dr. Lorrin Goodell, who has formally consulted, and recommends admission to the hospitalist service for further evaluation management of concern for acute ischemic CVA given presence of hypoglossal nerve dysfunction manifesting as patient's presenting articulation issue.  He recommends additional imaging including MRI brain as well as MRA of the head without contrast and bilateral carotid ultrasounds.  Additionally, he represents a 3-week course of dual antiplatelet therapy with baby aspirin and Plavix followed by daily baby aspirin alone.  This is in addition to restratification regarding acute ischemic CVA risk factors in addition to consults of PT/OT/SLP, recommending that the patient be kept n.p.o. 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  ?  ?Subsequently, the patient was admitted for further evaluation and management of suspected acute ischemic CVA after presenting with 12 days of articulation deficit.  ?  ? ?Hospital Course:  ?Patient was admitted and found to have a infarct on CT head large left subcortical embolic infarct of indeterminate age secondary to A-fib due to nonadherence to Eliquis.  Patient has a leadless pacemaker which is MRI incompatible so unable to fully assess stroke with MRI imaging.  CTA head and neck showed proximal left ICA 60% stenosis.  Cardiac echo unrevealing.  Patient advised to be compliant with her Eliquis 5 mg twice a day and to take Plavix.  Hemoglobin A1c was 6.2% with an LDL of 86%.  2D echo with normal EF.  Patient is to follow-up with primary care physician in 1 to 2 weeks.  She is also to follow-up with neurology team in 2 to 4 weeks.  She was cleared by speech therapy with no recommendation for follow-up.  She is a bilateral amputee and does transfer herself and take care of herself alone at home with a wheelchair.  Home health has been set up  with physical therapy.  She does have a wound to her left stump which will need to be followed closely as an outpatient.  She did miss her appointment for prosthetic placement due to his hospitalization so this will need to be rearranged.  Her dysarthria has improved during this hospitalization.  Patient being discharged in stable and improved condition with maximal help set up at home by her case management team. ? ? ?Consultations: ?Neurologist stroke team ? ?Discharge Exam: ?Vitals:  ? 11/24/21 0409 11/24/21 0821  ?BP: 129/60 129/72  ?Pulse: (!) 50 64  ?Resp: 16 18  ?Temp: 98.4 ?F (36.9 ?C) 98.1 ?F (36.7 ?C)  ?SpO2: 99% 97%  ? ? ?General: Alert and oriented no apparent distress ?Cardiovascular: Regular rate and rhythm without murmurs rubs or gallops ?Respiratory: Clear to auscultate ? ?Discharge Instructions ? ? ?Discharge Instructions   ? ? Ambulatory referral to Neurology   Complete by: As directed ?  ? An appointment is requested in approximately: 4 weeks  ? Ambulatory referral to Occupational Therapy   Complete by: As directed ?  ? Ambulatory referral to Physical Therapy   Complete by: As directed ?  ? Ambulatory referral to Speech Therapy   Complete by: As directed ?  ? Diet - low sodium heart healthy   Complete by: As directed ?  ? Discharge instructions   Complete by: As directed ?  ? Follow-up with primary care  physician in 1 to 2 weeks ? ?Follow-up with neurology in 2 to 4 weeks  ? Increase activity slowly   Complete by: As directed ?  ? Remove dressing in 48 hours   Complete by: As directed ?  ? ?  ? ?Allergies as of 11/24/2021   ? ?   Reactions  ? Brilinta [ticagrelor] Anaphylaxis, Shortness Of Breath, Other (See Comments)  ? Also, chest tightness  ? Latex Rash  ? Tape Rash  ? Zetia [ezetimibe]   ? fatigue  ? ?  ? ?  ?Medication List  ?  ? ?STOP taking these medications   ? ?cephALEXin 500 MG capsule ?Commonly known as: KEFLEX ?  ? ?  ? ?TAKE these medications   ? ?acetaminophen 500 MG tablet ?Commonly  known as: TYLENOL ?Take 500-1,000 mg by mouth every 8 (eight) hours as needed for mild pain or headache. ?  ?apixaban 5 MG Tabs tablet ?Commonly known as: Eliquis ?Take 1 tablet (5 mg total) by mouth 2 (tw

## 2021-11-23 NOTE — Discharge Instructions (Addendum)
Dressing procedure/placement/frequency: ?Clean the left tip of the AKA with soap and water, pat dry and apply a small piece of xeroform gauze and secure with a foam dressing. Change every other day. Apply a warm compress on the area several times a day. ?

## 2021-11-24 ENCOUNTER — Other Ambulatory Visit (HOSPITAL_COMMUNITY): Payer: Self-pay

## 2021-11-24 DIAGNOSIS — I639 Cerebral infarction, unspecified: Secondary | ICD-10-CM | POA: Diagnosis not present

## 2021-11-24 MED ORDER — METOPROLOL TARTRATE 25 MG PO TABS
12.5000 mg | ORAL_TABLET | Freq: Two times a day (BID) | ORAL | 0 refills | Status: DC
  Filled 2021-11-24: qty 60, 60d supply, fill #0

## 2021-11-24 MED ORDER — CLOPIDOGREL BISULFATE 75 MG PO TABS
75.0000 mg | ORAL_TABLET | Freq: Every day | ORAL | 0 refills | Status: DC
  Filled 2021-11-24: qty 30, 30d supply, fill #0

## 2021-11-24 MED ORDER — APIXABAN 5 MG PO TABS
5.0000 mg | ORAL_TABLET | Freq: Two times a day (BID) | ORAL | 0 refills | Status: DC
  Filled 2021-11-24: qty 60, 30d supply, fill #0

## 2021-11-24 NOTE — Progress Notes (Signed)
Occupational Therapy Treatment ?Patient Details ?Name: Mercedes [name] ?MRN: [id number] ?DOB: 1956/06/17 ?Today's Date: 11/24/2021 ? ? ?History of present illness Pt is a 66 yo female presenting with 12 day hx of difficulty speaking, R UE weakness. CT with 1.5 cm hypodensity involving the L frontal centrum semi ovale, age indeterminate.  PMH: B AKA, takotsubo CM, SLE, RA, PAD, MI, HTN, CAD. ?  ?OT comments ? Pt progressing well towards OT goals.  Completing scooting at EOB with supervision, good safety awareness.  Issued HEP with yellow theraputty for R UE strength and coordination. Pt eager to dc home.  Updated dc plan to [facility] per pt request.  Will have support from family as needed.   ? ?Recommendations for follow up therapy are one component of a multi-disciplinary discharge planning process, led by the attending physician.  Recommendations may be updated based on patient status, additional functional criteria and insurance authorization. ?   ?Follow Up Recommendations ? Home health OT  ?  ?Assistance Recommended at Discharge Intermittent Supervision/Assistance  ?Patient can return home with the following ? A little help with walking and/or transfers;A little help with bathing/dressing/bathroom;Assistance with cooking/housework;Assist for transportation ?  ?Equipment Recommendations ? None recommended by OT  ?  ?Recommendations for Other Services   ? ?  ?Precautions / Restrictions Precautions ?Precautions: Fall ?Precaution Comments: B AKA, wound on L residual limb ?Restrictions ?Weight Bearing Restrictions: No  ? ? ?  ? ?Mobility Bed Mobility ?Overal bed mobility: Modified Independent ?  ?  ?  ?  ?  ?  ?  ?  ? ?Transfers ?  ?  ?  ?  ?  ?  ?  ?  ?  ?  ?  ?  ?Balance   ?Sitting-balance support: No upper extremity supported ?Sitting balance-Leahy Scale: Good ?  ?  ?  ?  ?  ?  ?  ?  ?  ?  ?  ?  ?  ?  ?  ?  ?   ? ?ADL either performed or assessed with clinical judgement  ? ?ADL   ?  ?  ?  ?  ?  ?  ?  ?  ?  ?  ?  ?   ?Toilet Transfer: Supervision/safety ?Toilet Transfer Details (indicate cue type and reason): simulated lateral scooting at EOB ?  ?  ?  ?  ?  ?General ADL Comments: focused on HEP to R UE ?  ? ?Extremity/Trunk Assessment Upper Extremity Assessment ?RUE Deficits / Details: grossly 3+/5 compared to 4+/5 MMT ?RUE Sensation: WNL ?RUE Coordination: decreased fine motor ?  ?  ?  ?  ?  ? ?Vision   ?Vision Assessment?: No apparent visual deficits ?  ?Perception   ?  ?Praxis   ?  ? ?Cognition Arousal/Alertness: Awake/alert ?Behavior During Therapy: [facility] for tasks assessed/performed ?Overall Cognitive Status: Within Functional Limits for tasks assessed ?  ?  ?  ?  ?  ?  ?  ?  ?  ?  ?  ?  ?  ?  ?  ?  ?  ?  ?  ?   ?Exercises   ? ?  ?Shoulder Instructions   ? ? ?  ?General Comments provided HEP to R UE with yellow theraputty  ? ? ?Pertinent Vitals/ Pain       Pain Assessment ?Pain Assessment: Faces ?Faces Pain Scale: Hurts a little bit ?Pain Location: L AKA wound ?Pain Descriptors / Indicators: Aching, Grimacing ?Pain Intervention(s): Limited  activity within patient's tolerance, Monitored during session, Repositioned ? ?Home Living   ?  ?  ?  ?  ?  ?  ?  ?  ?  ?  ?  ?  ?  ?  ?  ?  ?  ?  ? ?  ?Prior Functioning/Environment    ?  ?  ?  ?   ? ?Frequency ? Min 2X/week  ? ? ? ? ?  ?Progress Toward Goals ? ?OT Goals(current goals can now be found in the care plan section) ? Progress towards OT goals: Progressing toward goals ? ?Acute Rehab OT Goals ?Patient Stated Goal: home ?OT Goal Formulation: With patient ?Time For Goal Achievement: 12/06/21 ?Potential to Achieve Goals: Good  ?Plan Frequency remains appropriate;Discharge plan needs to be updated   ? ?Co-evaluation ? ? ?   ?  ?  ?  ?  ? ?  ?AM-PAC OT "6 Clicks" Daily Activity     ?Outcome Measure ? ? Help from another person eating meals?: None ?Help from another person taking care of personal grooming?: None ?Help from another person toileting, which includes using toliet,  bedpan, or urinal?: A Little ?Help from another person bathing (including washing, rinsing, drying)?: A Little ?Help from another person to put on and taking off regular upper body clothing?: A Little ?Help from another person to put on and taking off regular lower body clothing?: A Little ?6 Click Score: 20 ? ?  ?End of Session   ? ?OT Visit Diagnosis: Other abnormalities of gait and mobility (R26.89);Muscle weakness (generalized) (M62.81) ?  ?Activity Tolerance Patient tolerated treatment well ?  ?Patient Left in bed;with call bell/phone within reach;with bed alarm set ?  ?Nurse Communication Mobility status ?  ? ?   ? ?Time: 4239-5320 ?OT Time Calculation (min): 27 min ? ?Charges: OT General Charges ?$OT Visit: 1 Visit ?OT Treatments ?$Self Care/Home Management : 8-22 mins ?$Neuromuscular Re-education: 8-22 mins ? ?Barry Brunner, OT ?Acute Rehabilitation Services ?Pager [phone] ?Office [phone] ? ? ?Mercedes [name] ?11/24/2021, 9:14 AM ?

## 2021-11-24 NOTE — Plan of Care (Signed)
Pt vitals stable during overnight. No prns. Family to arrive around 930 to pick pt up. AVS and iv removed by dayshift rn 11/23/21 ?Problem: Education: ?Goal: Knowledge of General Education information will improve ?Description: Including pain rating scale, medication(s)/side effects and non-pharmacologic comfort measures ?Outcome: Progressing ?  ?Problem: Health Behavior/Discharge Planning: ?Goal: Ability to manage health-related needs will improve ?Outcome: Progressing ?  ?Problem: Clinical Measurements: ?Goal: Ability to maintain clinical measurements within normal limits will improve ?Outcome: Progressing ?Goal: Will remain free from infection ?Outcome: Progressing ?Goal: Diagnostic test results will improve ?Outcome: Progressing ?Goal: Cardiovascular complication will be avoided ?Outcome: Progressing ?  ?Problem: Activity: ?Goal: Risk for activity intolerance will decrease ?Outcome: Progressing ?  ?Problem: Nutrition: ?Goal: Adequate nutrition will be maintained ?Outcome: Progressing ?  ?Problem: Coping: ?Goal: Level of anxiety will decrease ?Outcome: Progressing ?  ?Problem: Elimination: ?Goal: Will not experience complications related to bowel motility ?Outcome: Progressing ?Goal: Will not experience complications related to urinary retention ?Outcome: Progressing ?  ?Problem: Pain Managment: ?Goal: General experience of comfort will improve ?Outcome: Progressing ?  ?Problem: Safety: ?Goal: Ability to remain free from injury will improve ?Outcome: Progressing ?  ?Problem: Skin Integrity: ?Goal: Risk for impaired skin integrity will decrease ?Outcome: Progressing ?  ?Problem: Education: ?Goal: Knowledge of disease or condition will improve ?Outcome: Progressing ?Goal: Knowledge of secondary prevention will improve (SELECT ALL) ?Outcome: Progressing ?Goal: Knowledge of patient specific risk factors will improve (INDIVIDUALIZE FOR PATIENT) ?Outcome: Progressing ?Goal: Individualized Educational Video(s) ?Outcome:  Progressing ?  ?Problem: Self-Care: ?Goal: Ability to participate in self-care as condition permits will improve ?Outcome: Progressing ?Goal: Verbalization of feelings and concerns over difficulty with self-care will improve ?Outcome: Progressing ?Goal: Ability to communicate needs accurately will improve ?Outcome: Progressing ?  ?Problem: Nutrition: ?Goal: Risk of aspiration will decrease ?Outcome: Progressing ?Goal: Dietary intake will improve ?Outcome: Progressing ?  ?Problem: Ischemic Stroke/TIA Tissue Perfusion: ?Goal: Complications of ischemic stroke/TIA will be minimized ?Outcome: Progressing ?  ?Problem: Education: ?Goal: Knowledge of disease or condition will improve ?Outcome: Progressing ?Goal: Knowledge of secondary prevention will improve (SELECT ALL) ?Outcome: Progressing ?  ?

## 2021-11-24 NOTE — Plan of Care (Signed)
?  Problem: Education: ?Goal: Knowledge of General Education information will improve ?Description: Including pain rating scale, medication(s)/side effects and non-pharmacologic comfort measures ?Outcome: Adequate for Discharge ?  ?Problem: Health Behavior/Discharge Planning: ?Goal: Ability to manage health-related needs will improve ?Outcome: Adequate for Discharge ?  ?Problem: Clinical Measurements: ?Goal: Ability to maintain clinical measurements within normal limits will improve ?Outcome: Adequate for Discharge ?Goal: Will remain free from infection ?Outcome: Adequate for Discharge ?Goal: Diagnostic test results will improve ?Outcome: Adequate for Discharge ?Goal: Cardiovascular complication will be avoided ?Outcome: Adequate for Discharge ?  ?Problem: Activity: ?Goal: Risk for activity intolerance will decrease ?Outcome: Adequate for Discharge ?  ?Problem: Nutrition: ?Goal: Adequate nutrition will be maintained ?Outcome: Adequate for Discharge ?  ?Problem: Coping: ?Goal: Level of anxiety will decrease ?Outcome: Adequate for Discharge ?  ?Problem: Elimination: ?Goal: Will not experience complications related to bowel motility ?Outcome: Adequate for Discharge ?Goal: Will not experience complications related to urinary retention ?Outcome: Adequate for Discharge ?  ?Problem: Pain Managment: ?Goal: General experience of comfort will improve ?Outcome: Adequate for Discharge ?  ?Problem: Safety: ?Goal: Ability to remain free from injury will improve ?Outcome: Adequate for Discharge ?  ?Problem: Skin Integrity: ?Goal: Risk for impaired skin integrity will decrease ?Outcome: Adequate for Discharge ?  ?Problem: Education: ?Goal: Knowledge of disease or condition will improve ?Outcome: Adequate for Discharge ?Goal: Knowledge of secondary prevention will improve (SELECT ALL) ?Outcome: Adequate for Discharge ?Goal: Knowledge of patient specific risk factors will improve (INDIVIDUALIZE FOR PATIENT) ?Outcome: Adequate for  Discharge ?Goal: Individualized Educational Video(s) ?Outcome: Adequate for Discharge ?  ?Problem: Self-Care: ?Goal: Ability to participate in self-care as condition permits will improve ?Outcome: Adequate for Discharge ?Goal: Verbalization of feelings and concerns over difficulty with self-care will improve ?Outcome: Adequate for Discharge ?Goal: Ability to communicate needs accurately will improve ?Outcome: Adequate for Discharge ?  ?Problem: Nutrition: ?Goal: Risk of aspiration will decrease ?Outcome: Adequate for Discharge ?Goal: Dietary intake will improve ?Outcome: Adequate for Discharge ?  ?Problem: Ischemic Stroke/TIA Tissue Perfusion: ?Goal: Complications of ischemic stroke/TIA will be minimized ?Outcome: Adequate for Discharge ?  ?Problem: Education: ?Goal: Knowledge of disease or condition will improve ?Outcome: Adequate for Discharge ?Goal: Knowledge of secondary prevention will improve (SELECT ALL) ?Outcome: Adequate for Discharge ?  ?

## 2021-11-25 ENCOUNTER — Telehealth: Payer: Self-pay

## 2021-11-25 NOTE — Telephone Encounter (Signed)
Pt is in of [facility] referral for OT, PT, and Speech. Upon D/C from [facility] they sent referral for outpatient and she is in need of [facility] services.  ? ?Pt has a FU on 11/29/21 HOS admission for Stroke. ? ?Please advise  ?

## 2021-11-28 ENCOUNTER — Telehealth: Payer: Self-pay

## 2021-11-28 DIAGNOSIS — Z89611 Acquired absence of right leg above knee: Secondary | ICD-10-CM

## 2021-11-28 DIAGNOSIS — Z89612 Acquired absence of left leg above knee: Secondary | ICD-10-CM

## 2021-11-28 DIAGNOSIS — I639 Cerebral infarction, unspecified: Secondary | ICD-10-CM

## 2021-11-28 NOTE — Telephone Encounter (Signed)
See other phone note

## 2021-11-28 NOTE — Telephone Encounter (Signed)
Ordered

## 2021-11-28 NOTE — Telephone Encounter (Signed)
Pt is requesting a call back she has questions about HH. ? ?Pt CB [phone] ?

## 2021-11-29 ENCOUNTER — Inpatient Hospital Stay: Payer: Medicare Other | Admitting: Internal Medicine

## 2021-12-01 ENCOUNTER — Inpatient Hospital Stay: Payer: Medicare Other | Admitting: Internal Medicine

## 2021-12-02 ENCOUNTER — Telehealth (HOSPITAL_COMMUNITY): Payer: Self-pay

## 2021-12-02 NOTE — Telephone Encounter (Signed)
Pharmacy Transitions of Care Follow-up Telephone Call ? ?Date of discharge: 11/24/21  ?Discharge Diagnosis: acute ischemic stroke ? ?How have you been since you were released from the hospital? Patient has been well since discharge, no questions about meds at this time.  ? ?Medication changes made at discharge: ?     START taking: ?clopidogrel (PLAVIX)  ?CHANGE how you take: ?metoprolol tartrate (LOPRESSOR)  ?STOP taking: ?cephALEXin 500 MG capsule (KEFLEX)  ? ?Medication changes verified by the patient? Yes ?  ? ?Medication Accessibility: ? ?Home Pharmacy: Not discussed  ? ?Was the patient provided with refills on discharged medications? No  ? ?Have all prescriptions been transferred from [facility] to home pharmacy? N/A  ? ?Is the patient able to afford medications? Has insurance ?  ? ?Medication Review: ? ?Patient states they have been taking these medications for years and is aware of side effects. ? ?APIXABAN (ELIQUIS)  ?Apixaban 5 mg BID initiated on 11/24/21.  ?- Discussed importance of taking medication around the same time everyday  ?- Advised patient of medications to avoid (NSAIDs, ASA)  ?- Educated that Tylenol (acetaminophen) will be the preferred analgesic to prevent risk of bleeding  ?- Emphasized importance of monitoring for signs and symptoms of bleeding (abnormal bruising, prolonged bleeding, nose bleeds, bleeding from gums, discolored urine, black tarry stools)  ?- Advised patient to alert all providers of anticoagulation therapy prior to starting a new medication or having a procedure  ? ?CLOPIDOGREL (PLAVIX) ?Clopidogrel 75 mg once daily.  ?- Advised patient of medications to avoid (NSAIDs, ASA)  ?- Educated that Tylenol (acetaminophen) will be the preferred analgesic to prevent risk of bleeding  ?- Emphasized importance of monitoring for signs and symptoms of bleeding (abnormal bruising, prolonged bleeding, nose bleeds, bleeding from gums, discolored urine, black tarry stools)  ?- Advised patient to  alert all providers of anticoagulation therapy prior to starting a new medication or having a procedure  ? ? ?Follow-up Appointments: ? ?PCP Hospital f/u appt confirmed?  Scheduled to see Dr. Quay Burow on 12/05/21 @ 1:40PM.  ? ?[facility] f/u appt confirmed?  Scheduled to see Dr. Leonie Man on 01/26/22 @ 10AM.  ? ?If their condition worsens, is the pt aware to call PCP or go to the Emergency Dept.? Yes ? ?Final Patient Assessment: ?Patient has f/u scheduled and knows to get refills at f/u ? ?

## 2021-12-04 ENCOUNTER — Encounter: Payer: Self-pay | Admitting: Internal Medicine

## 2021-12-04 NOTE — Patient Instructions (Addendum)
? ? ? ?  Blood work was ordered.   ? ? ?Medications changes include :    ? ? ?Your prescription(s) have been sent to your pharmacy.  ? ? ?A referral was ordered for XX.     Someone from that office will call you to schedule an appointment.  ? ? ?Return in about 6 months (around 06/06/2022) for follow up. ? ?

## 2021-12-04 NOTE — Progress Notes (Signed)
? ? ? ? ?Subjective:  ? ? Patient ID: Mercedes [name], female    DOB: 1955/09/28, 66 y.o.   MRN: [id number] ? ?This visit occurred during the SARS-CoV-2 public health emergency.  Safety protocols were in place, including screening questions prior to the visit, additional usage of staff PPE, and extensive cleaning of exam room while observing appropriate contact time as indicated for disinfecting solutions.   ? ? ?HPI ?Mercedes [name] is here for follow up from the hospital.   ? ?Admitted 3/27 - 3/30 for acute ischemic stroke ? ?12 days prior awoke with severe HA and new onset asphasia.  She knew what she wanted to say but had difficulty articulating it.  This was unchanged x 12 days.  She had dysphagia that did not improve.  She also noted RUE weakness that had improved.  HA had resolved.  ? ?CT ead - large left subcortical emblic infarct indeterminate age due to afib and nonadherence to eliquis.  CTA head and neck 60% ICA stenosis on left. Dysarthria improved.  Discharged on eliquis and plavix x 3 weeks then just eliquis.  I ordered home PT, nursing, ST and HHA.  ? ? ? ? ? ? ?Medications and allergies reviewed with patient and updated if appropriate. ? ?Current Outpatient Medications on File Prior to Visit  ?Medication Sig Dispense Refill  ? acetaminophen (TYLENOL) 500 MG tablet Take 500-1,000 mg by mouth every 8 (eight) hours as needed for mild pain or headache.    ? apixaban (ELIQUIS) 5 MG TABS tablet Take 1 tablet (5 mg total) by mouth 2 (two) times daily. 60 tablet 0  ? clopidogrel (PLAVIX) 75 MG tablet Take 1 tablet (75 mg total) by mouth daily. 30 tablet 0  ? metoprolol tartrate (LOPRESSOR) 25 MG tablet TAKE 1/2 TABLET BY MOUTH TWICE DAILY. (Patient taking differently: Take 12.5 mg by mouth 2 (two) times daily.) 180 tablet 3  ? metoprolol tartrate (LOPRESSOR) 25 MG tablet Take 1/2 tablet (12.5 mg total) by mouth 2 (two) times daily. 60 tablet 0  ? nitroGLYCERIN (NITROSTAT) 0.4 MG SL tablet Place 0.4 mg under the  tongue every 5 (five) minutes as needed for chest pain.    ? rosuvastatin (CRESTOR) 40 MG tablet Take 40 mg by mouth at bedtime.    ? ?No current facility-administered medications on file prior to visit.  ? ? ? ?Review of Systems ? ?   ?Objective:  ?There were no vitals filed for this visit. ?BP Readings from Last 3 Encounters:  ?11/24/21 129/72  ?11/08/21 (!) 164/100  ?08/26/21 (!) 170/88  ? ?Wt Readings from Last 3 Encounters:  ?11/24/21 94 lb 9.2 oz (42.9 kg)  ?01/10/21 92 lb (41.7 kg)  ?11/22/20 103 lb 2.8 oz (46.8 kg)  ? ?There is no height or weight on file to calculate BMI. ? ?  ?Physical Exam ?   ? ?Lab Results  ?Component Value Date  ? WBC 5.5 11/22/2021  ? HGB 11.8 (L) 11/22/2021  ? HCT 37.6 11/22/2021  ? PLT 448 (H) 11/22/2021  ? GLUCOSE 101 (H) 11/22/2021  ? CHOL 143 11/22/2021  ? TRIG 92 11/22/2021  ? HDL 39 (L) 11/22/2021  ? [place] 86 11/22/2021  ? ALT 9 11/22/2021  ? AST 13 (L) 11/22/2021  ? NA 135 11/22/2021  ? K 4.0 11/22/2021  ? CL 106 11/22/2021  ? CREATININE 0.71 11/22/2021  ? BUN 11 11/22/2021  ? CO2 21 (L) 11/22/2021  ? TSH 0.75 01/31/2019  ? INR 1.0 11/21/2021  ?  HGBA1C 6.2 (H) 11/22/2021  ? ? ? ?Assessment & Plan:  ? ? ?See Problem List for Assessment and Plan of chronic medical problems.  ? ?This encounter was created in error - please disregard. ?

## 2021-12-05 ENCOUNTER — Encounter: Payer: Medicare Other | Admitting: Internal Medicine

## 2021-12-06 DIAGNOSIS — I739 Peripheral vascular disease, unspecified: Secondary | ICD-10-CM | POA: Diagnosis not present

## 2021-12-06 DIAGNOSIS — I10 Essential (primary) hypertension: Secondary | ICD-10-CM | POA: Diagnosis not present

## 2021-12-06 DIAGNOSIS — I480 Paroxysmal atrial fibrillation: Secondary | ICD-10-CM | POA: Diagnosis not present

## 2021-12-06 DIAGNOSIS — Z950 Presence of cardiac pacemaker: Secondary | ICD-10-CM | POA: Diagnosis not present

## 2021-12-06 DIAGNOSIS — Z89612 Acquired absence of left leg above knee: Secondary | ICD-10-CM | POA: Diagnosis not present

## 2021-12-06 DIAGNOSIS — E785 Hyperlipidemia, unspecified: Secondary | ICD-10-CM | POA: Diagnosis not present

## 2021-12-06 DIAGNOSIS — G319 Degenerative disease of nervous system, unspecified: Secondary | ICD-10-CM | POA: Diagnosis not present

## 2021-12-06 DIAGNOSIS — Z9181 History of falling: Secondary | ICD-10-CM | POA: Diagnosis not present

## 2021-12-06 DIAGNOSIS — Z7901 Long term (current) use of anticoagulants: Secondary | ICD-10-CM | POA: Diagnosis not present

## 2021-12-06 DIAGNOSIS — I2510 Atherosclerotic heart disease of native coronary artery without angina pectoris: Secondary | ICD-10-CM | POA: Diagnosis not present

## 2021-12-06 DIAGNOSIS — Z7902 Long term (current) use of antithrombotics/antiplatelets: Secondary | ICD-10-CM | POA: Diagnosis not present

## 2021-12-06 DIAGNOSIS — S81002D Unspecified open wound, left knee, subsequent encounter: Secondary | ICD-10-CM | POA: Diagnosis not present

## 2021-12-06 DIAGNOSIS — Z89611 Acquired absence of right leg above knee: Secondary | ICD-10-CM | POA: Diagnosis not present

## 2021-12-06 DIAGNOSIS — I69351 Hemiplegia and hemiparesis following cerebral infarction affecting right dominant side: Secondary | ICD-10-CM | POA: Diagnosis not present

## 2021-12-06 NOTE — Telephone Encounter (Signed)
Okay for orders? 

## 2021-12-06 NOTE — Telephone Encounter (Signed)
April completed start of care for pt today and is requesting a frequency of: ?1 time a week nursing ?1 time a week [facility] ? ?Wound care order is for Lt stump Xerform to be changed every other day ? ?Please advise [phone] ?

## 2021-12-07 ENCOUNTER — Encounter: Payer: Self-pay | Admitting: Internal Medicine

## 2021-12-07 DIAGNOSIS — Z89612 Acquired absence of left leg above knee: Secondary | ICD-10-CM | POA: Diagnosis not present

## 2021-12-07 DIAGNOSIS — I480 Paroxysmal atrial fibrillation: Secondary | ICD-10-CM | POA: Diagnosis not present

## 2021-12-07 DIAGNOSIS — I69351 Hemiplegia and hemiparesis following cerebral infarction affecting right dominant side: Secondary | ICD-10-CM | POA: Diagnosis not present

## 2021-12-07 DIAGNOSIS — Z950 Presence of cardiac pacemaker: Secondary | ICD-10-CM | POA: Diagnosis not present

## 2021-12-07 DIAGNOSIS — I739 Peripheral vascular disease, unspecified: Secondary | ICD-10-CM | POA: Diagnosis not present

## 2021-12-07 DIAGNOSIS — S81002D Unspecified open wound, left knee, subsequent encounter: Secondary | ICD-10-CM | POA: Diagnosis not present

## 2021-12-07 DIAGNOSIS — I2510 Atherosclerotic heart disease of native coronary artery without angina pectoris: Secondary | ICD-10-CM | POA: Diagnosis not present

## 2021-12-07 DIAGNOSIS — G319 Degenerative disease of nervous system, unspecified: Secondary | ICD-10-CM | POA: Diagnosis not present

## 2021-12-07 DIAGNOSIS — Z89611 Acquired absence of right leg above knee: Secondary | ICD-10-CM | POA: Diagnosis not present

## 2021-12-07 DIAGNOSIS — Z7901 Long term (current) use of anticoagulants: Secondary | ICD-10-CM | POA: Diagnosis not present

## 2021-12-07 DIAGNOSIS — I10 Essential (primary) hypertension: Secondary | ICD-10-CM | POA: Diagnosis not present

## 2021-12-07 DIAGNOSIS — Z9181 History of falling: Secondary | ICD-10-CM | POA: Diagnosis not present

## 2021-12-07 DIAGNOSIS — E785 Hyperlipidemia, unspecified: Secondary | ICD-10-CM | POA: Diagnosis not present

## 2021-12-07 DIAGNOSIS — Z7902 Long term (current) use of antithrombotics/antiplatelets: Secondary | ICD-10-CM | POA: Diagnosis not present

## 2021-12-07 NOTE — Patient Instructions (Signed)
     Blood work was ordered.     Medications changes include :   none   Your prescription(s) have been sent to your pharmacy.    A referral was ordered for [facility] for a colonoscopy.     Someone from that office will call you to schedule an appointment.    Return in about 6 months (around 09/28/2022) for Physical Exam.   [facility] Phone: [phone]  

## 2021-12-07 NOTE — Progress Notes (Signed)
? ? ? ? ?Subjective:  ? ? Patient ID: Mercedes [name], female    DOB: [birth year], 66 y.o.   MRN: [id number] ? ?This visit occurred during the SARS-CoV-2 public health emergency.  Safety protocols were in place, including screening questions prior to the visit, additional usage of staff PPE, and extensive cleaning of exam room while observing appropriate contact time as indicated for disinfecting solutions.   ? ? ?HPI ?Mercedes [name] is here for follow up from the hospital.   ? ?Admitted 3/27 - 3/30 for acute ischemic stroke ? ?12 days prior awoke with severe HA and new onset asphasia.  She knew what she wanted to say but had difficulty articulating it.  This was unchanged x 12 days.  She had dysphagia that did not improve.  She also noted RUE weakness that had improved.  HA had resolved.  ? ?CT head - large left subcortical emblic infarct indeterminate age due to afib and nonadherence to eliquis.  CTA head and neck 60% ICA stenosis on left. Dysarthria improved.  Discharged on eliquis and plavix x 3 weeks then just eliquis.  I ordered home PT, nursing, ST and HHA.  ? ? ? ? ? ? ?Medications and allergies reviewed with patient and updated if appropriate. ? ?Current Outpatient Medications on File Prior to Visit  ?Medication Sig Dispense Refill  ? acetaminophen (TYLENOL) 500 MG tablet Take 500-1,000 mg by mouth every 8 (eight) hours as needed for mild pain or headache.    ? apixaban (ELIQUIS) 5 MG TABS tablet Take 1 tablet (5 mg total) by mouth 2 (two) times daily. 60 tablet 0  ? clopidogrel (PLAVIX) 75 MG tablet Take 1 tablet (75 mg total) by mouth daily. 30 tablet 0  ? metoprolol tartrate (LOPRESSOR) 25 MG tablet TAKE 1/2 TABLET BY MOUTH TWICE DAILY. (Patient taking differently: Take 12.5 mg by mouth 2 (two) times daily.) 180 tablet 3  ? metoprolol tartrate (LOPRESSOR) 25 MG tablet Take 1/2 tablet (12.5 mg total) by mouth 2 (two) times daily. 60 tablet 0  ? nitroGLYCERIN (NITROSTAT) 0.4 MG SL tablet Place 0.4 mg under the  tongue every 5 (five) minutes as needed for chest pain.    ? rosuvastatin (CRESTOR) 40 MG tablet Take 40 mg by mouth at bedtime.    ? ?No current facility-administered medications on file prior to visit.  ? ? ? ?Review of Systems ? ?   ?Objective:  ?There were no vitals filed for this visit. ?BP Readings from Last 3 Encounters:  ?11/24/21 129/72  ?11/08/21 (!) 164/100  ?08/26/21 (!) 170/88  ? ?Wt Readings from Last 3 Encounters:  ?11/24/21 94 lb 9.2 oz (42.9 kg)  ?01/10/21 92 lb (41.7 kg)  ?11/22/20 103 lb 2.8 oz (46.8 kg)  ? ?There is no height or weight on file to calculate BMI. ? ?  ?Physical Exam ?   ? ?Lab Results  ?Component Value Date  ? WBC 5.5 11/22/2021  ? HGB 11.8 (L) 11/22/2021  ? HCT 37.6 11/22/2021  ? PLT 448 (H) 11/22/2021  ? GLUCOSE 101 (H) 11/22/2021  ? CHOL 143 11/22/2021  ? TRIG 92 11/22/2021  ? HDL 39 (L) 11/22/2021  ? [place] 86 11/22/2021  ? ALT 9 11/22/2021  ? AST 13 (L) 11/22/2021  ? NA 135 11/22/2021  ? K 4.0 11/22/2021  ? CL 106 11/22/2021  ? CREATININE 0.71 11/22/2021  ? BUN 11 11/22/2021  ? CO2 21 (L) 11/22/2021  ? TSH 0.75 01/31/2019  ? INR 1.0 11/21/2021  ?  HGBA1C 6.2 (H) 11/22/2021  ? ? ? ?Assessment & Plan:  ? ? ?See Problem List for Assessment and Plan of chronic medical problems.  ? ?This encounter was created in error - please disregard. ?

## 2021-12-07 NOTE — Telephone Encounter (Signed)
Verbals given today. °

## 2021-12-08 ENCOUNTER — Encounter: Payer: Medicare Other | Admitting: Internal Medicine

## 2021-12-08 DIAGNOSIS — I10 Essential (primary) hypertension: Secondary | ICD-10-CM | POA: Diagnosis not present

## 2021-12-08 DIAGNOSIS — Z89612 Acquired absence of left leg above knee: Secondary | ICD-10-CM | POA: Diagnosis not present

## 2021-12-08 DIAGNOSIS — Z7901 Long term (current) use of anticoagulants: Secondary | ICD-10-CM | POA: Diagnosis not present

## 2021-12-08 DIAGNOSIS — E785 Hyperlipidemia, unspecified: Secondary | ICD-10-CM | POA: Diagnosis not present

## 2021-12-08 DIAGNOSIS — S81002D Unspecified open wound, left knee, subsequent encounter: Secondary | ICD-10-CM | POA: Diagnosis not present

## 2021-12-08 DIAGNOSIS — G319 Degenerative disease of nervous system, unspecified: Secondary | ICD-10-CM | POA: Diagnosis not present

## 2021-12-08 DIAGNOSIS — I2510 Atherosclerotic heart disease of native coronary artery without angina pectoris: Secondary | ICD-10-CM | POA: Diagnosis not present

## 2021-12-08 DIAGNOSIS — Z9181 History of falling: Secondary | ICD-10-CM | POA: Diagnosis not present

## 2021-12-08 DIAGNOSIS — Z7902 Long term (current) use of antithrombotics/antiplatelets: Secondary | ICD-10-CM | POA: Diagnosis not present

## 2021-12-08 DIAGNOSIS — I480 Paroxysmal atrial fibrillation: Secondary | ICD-10-CM | POA: Diagnosis not present

## 2021-12-08 DIAGNOSIS — Z89611 Acquired absence of right leg above knee: Secondary | ICD-10-CM | POA: Diagnosis not present

## 2021-12-08 DIAGNOSIS — I69351 Hemiplegia and hemiparesis following cerebral infarction affecting right dominant side: Secondary | ICD-10-CM | POA: Diagnosis not present

## 2021-12-08 DIAGNOSIS — Z950 Presence of cardiac pacemaker: Secondary | ICD-10-CM | POA: Diagnosis not present

## 2021-12-08 DIAGNOSIS — I739 Peripheral vascular disease, unspecified: Secondary | ICD-10-CM | POA: Diagnosis not present

## 2021-12-09 DIAGNOSIS — I10 Essential (primary) hypertension: Secondary | ICD-10-CM | POA: Diagnosis not present

## 2021-12-09 DIAGNOSIS — Z950 Presence of cardiac pacemaker: Secondary | ICD-10-CM | POA: Diagnosis not present

## 2021-12-09 DIAGNOSIS — E785 Hyperlipidemia, unspecified: Secondary | ICD-10-CM | POA: Diagnosis not present

## 2021-12-09 DIAGNOSIS — Z89612 Acquired absence of left leg above knee: Secondary | ICD-10-CM | POA: Diagnosis not present

## 2021-12-09 DIAGNOSIS — Z9181 History of falling: Secondary | ICD-10-CM | POA: Diagnosis not present

## 2021-12-09 DIAGNOSIS — Z7901 Long term (current) use of anticoagulants: Secondary | ICD-10-CM | POA: Diagnosis not present

## 2021-12-09 DIAGNOSIS — S81002D Unspecified open wound, left knee, subsequent encounter: Secondary | ICD-10-CM | POA: Diagnosis not present

## 2021-12-09 DIAGNOSIS — Z7902 Long term (current) use of antithrombotics/antiplatelets: Secondary | ICD-10-CM | POA: Diagnosis not present

## 2021-12-09 DIAGNOSIS — I2510 Atherosclerotic heart disease of native coronary artery without angina pectoris: Secondary | ICD-10-CM | POA: Diagnosis not present

## 2021-12-09 DIAGNOSIS — I739 Peripheral vascular disease, unspecified: Secondary | ICD-10-CM | POA: Diagnosis not present

## 2021-12-09 DIAGNOSIS — G319 Degenerative disease of nervous system, unspecified: Secondary | ICD-10-CM | POA: Diagnosis not present

## 2021-12-09 DIAGNOSIS — I69351 Hemiplegia and hemiparesis following cerebral infarction affecting right dominant side: Secondary | ICD-10-CM | POA: Diagnosis not present

## 2021-12-09 DIAGNOSIS — I480 Paroxysmal atrial fibrillation: Secondary | ICD-10-CM | POA: Diagnosis not present

## 2021-12-09 DIAGNOSIS — Z89611 Acquired absence of right leg above knee: Secondary | ICD-10-CM | POA: Diagnosis not present

## 2021-12-12 ENCOUNTER — Inpatient Hospital Stay: Payer: Medicare Other | Admitting: Internal Medicine

## 2021-12-12 DIAGNOSIS — Z89612 Acquired absence of left leg above knee: Secondary | ICD-10-CM | POA: Diagnosis not present

## 2021-12-12 DIAGNOSIS — Z89611 Acquired absence of right leg above knee: Secondary | ICD-10-CM | POA: Diagnosis not present

## 2021-12-12 DIAGNOSIS — Z7902 Long term (current) use of antithrombotics/antiplatelets: Secondary | ICD-10-CM | POA: Diagnosis not present

## 2021-12-12 DIAGNOSIS — Z7901 Long term (current) use of anticoagulants: Secondary | ICD-10-CM | POA: Diagnosis not present

## 2021-12-12 DIAGNOSIS — I480 Paroxysmal atrial fibrillation: Secondary | ICD-10-CM | POA: Diagnosis not present

## 2021-12-12 DIAGNOSIS — I10 Essential (primary) hypertension: Secondary | ICD-10-CM | POA: Diagnosis not present

## 2021-12-12 DIAGNOSIS — I2510 Atherosclerotic heart disease of native coronary artery without angina pectoris: Secondary | ICD-10-CM | POA: Diagnosis not present

## 2021-12-12 DIAGNOSIS — S81002D Unspecified open wound, left knee, subsequent encounter: Secondary | ICD-10-CM | POA: Diagnosis not present

## 2021-12-12 DIAGNOSIS — G319 Degenerative disease of nervous system, unspecified: Secondary | ICD-10-CM | POA: Diagnosis not present

## 2021-12-12 DIAGNOSIS — I69351 Hemiplegia and hemiparesis following cerebral infarction affecting right dominant side: Secondary | ICD-10-CM | POA: Diagnosis not present

## 2021-12-12 DIAGNOSIS — I739 Peripheral vascular disease, unspecified: Secondary | ICD-10-CM | POA: Diagnosis not present

## 2021-12-12 DIAGNOSIS — E785 Hyperlipidemia, unspecified: Secondary | ICD-10-CM | POA: Diagnosis not present

## 2021-12-12 DIAGNOSIS — Z950 Presence of cardiac pacemaker: Secondary | ICD-10-CM | POA: Diagnosis not present

## 2021-12-12 DIAGNOSIS — Z9181 History of falling: Secondary | ICD-10-CM | POA: Diagnosis not present

## 2021-12-13 DIAGNOSIS — S81002D Unspecified open wound, left knee, subsequent encounter: Secondary | ICD-10-CM | POA: Diagnosis not present

## 2021-12-13 DIAGNOSIS — I739 Peripheral vascular disease, unspecified: Secondary | ICD-10-CM | POA: Diagnosis not present

## 2021-12-13 DIAGNOSIS — I69351 Hemiplegia and hemiparesis following cerebral infarction affecting right dominant side: Secondary | ICD-10-CM | POA: Diagnosis not present

## 2021-12-13 DIAGNOSIS — G319 Degenerative disease of nervous system, unspecified: Secondary | ICD-10-CM | POA: Diagnosis not present

## 2021-12-13 DIAGNOSIS — Z89611 Acquired absence of right leg above knee: Secondary | ICD-10-CM | POA: Diagnosis not present

## 2021-12-13 DIAGNOSIS — Z7902 Long term (current) use of antithrombotics/antiplatelets: Secondary | ICD-10-CM | POA: Diagnosis not present

## 2021-12-13 DIAGNOSIS — I2510 Atherosclerotic heart disease of native coronary artery without angina pectoris: Secondary | ICD-10-CM | POA: Diagnosis not present

## 2021-12-13 DIAGNOSIS — I10 Essential (primary) hypertension: Secondary | ICD-10-CM | POA: Diagnosis not present

## 2021-12-13 DIAGNOSIS — Z950 Presence of cardiac pacemaker: Secondary | ICD-10-CM | POA: Diagnosis not present

## 2021-12-13 DIAGNOSIS — Z7901 Long term (current) use of anticoagulants: Secondary | ICD-10-CM | POA: Diagnosis not present

## 2021-12-13 DIAGNOSIS — Z89612 Acquired absence of left leg above knee: Secondary | ICD-10-CM | POA: Diagnosis not present

## 2021-12-13 DIAGNOSIS — I480 Paroxysmal atrial fibrillation: Secondary | ICD-10-CM | POA: Diagnosis not present

## 2021-12-13 DIAGNOSIS — E785 Hyperlipidemia, unspecified: Secondary | ICD-10-CM | POA: Diagnosis not present

## 2021-12-13 DIAGNOSIS — Z9181 History of falling: Secondary | ICD-10-CM | POA: Diagnosis not present

## 2021-12-13 NOTE — Progress Notes (Signed)
? ? ? ? ?Subjective:  ? ? Patient ID: Mercedes [name], female    DOB: [birth year], 66 y.o.   MRN: [id number] ? ?This visit occurred during the SARS-CoV-2 public health emergency.  Safety protocols were in place, including screening questions prior to the visit, additional usage of staff PPE, and extensive cleaning of exam room while observing appropriate contact time as indicated for disinfecting solutions.   ? ? ?HPI ?Mercedes [name] is here for follow up from the hospital.   ? ? ?Admitted 3/27 - 3/30 for acute ischemic stroke ? ?12 days prior awoke with severe HA and new onset asphasia.  She knew what she wanted to say but had difficulty articulating it.  This was unchanged x 12 days.  She had dysphagia that did not improve.  She also noted RUE weakness that had improved.  HA had resolved.  ? ?CT head - large left subcortical emblic infarct indeterminate age due to afib and nonadherence to eliquis.  CTA head and neck 60% ICA stenosis on left. Dysarthria improved.  Discharged on eliquis and plavix x 3 weeks then just eliquis.  I ordered home PT, nursing, ST and HHA.  ? ?Since stroked - word finding difficulty and she still has some weakness in her RUE.   ? ?She is taking her medications religiously.  ? ?BP better at home.   ? ? ?Medications and allergies reviewed with patient and updated if appropriate. ? ?Current Outpatient Medications on File Prior to Visit  ?Medication Sig Dispense Refill  ? acetaminophen (TYLENOL) 500 MG tablet Take 500-1,000 mg by mouth every 8 (eight) hours as needed for mild pain or headache.    ? clopidogrel (PLAVIX) 75 MG tablet Take 1 tablet (75 mg total) by mouth daily. 30 tablet 0  ? metoprolol tartrate (LOPRESSOR) 25 MG tablet TAKE 1/2 TABLET BY MOUTH TWICE DAILY. (Patient taking differently: Take 12.5 mg by mouth 2 (two) times daily.) 180 tablet 3  ? ?No current facility-administered medications on file prior to visit.  ? ? ? ?Review of Systems  ?Constitutional:  Negative for fever.   ?Respiratory:  Positive for choking (occ with eating and drinking). Negative for cough, shortness of breath and wheezing.   ?Cardiovascular:  Negative for chest pain and palpitations.  ?Neurological:  Positive for weakness (right hand) and headaches (mild, allergy related?). Negative for dizziness and light-headedness.  ? ?   ?Objective:  ? ?Vitals:  ? 12/14/21 0925 12/14/21 1014  ?BP: (!) 146/80 140/72  ?Pulse: 70   ?Temp: 98.1 ?F (36.7 ?C)   ?SpO2: 99%   ? ?BP Readings from Last 3 Encounters:  ?12/14/21 140/72  ?11/24/21 129/72  ?11/08/21 (!) 164/100  ? ?Wt Readings from Last 3 Encounters:  ?11/24/21 94 lb 9.2 oz (42.9 kg)  ?01/10/21 92 lb (41.7 kg)  ?11/22/20 103 lb 2.8 oz (46.8 kg)  ? ?Body mass index is 15.74 kg/m?. ? ?  ?Physical Exam ?Constitutional:   ?   General: She is not in acute distress. ?   Appearance: Normal appearance.  ?HENT:  ?   Head: Normocephalic and atraumatic.  ?Eyes:  ?   Conjunctiva/sclera: Conjunctivae normal.  ?Cardiovascular:  ?   Rate and Rhythm: Normal rate and regular rhythm.  ?   Heart sounds: Normal heart sounds. No murmur heard. ?Pulmonary:  ?   Effort: Pulmonary effort is normal. No respiratory distress.  ?   Breath sounds: Normal breath sounds. No wheezing.  ?Musculoskeletal:  ?   Cervical back: Neck supple.  ?  Lymphadenopathy:  ?   Cervical: No cervical adenopathy.  ?Skin: ?   General: Skin is warm and dry.  ?   Findings: Lesion (scab on left stump) present. No rash.  ?Neurological:  ?   Mental Status: She is alert. Mental status is at baseline.  ?Psychiatric:     ?   Mood and Affect: Mood normal.     ?   Behavior: Behavior normal.  ? ?   ? ?Lab Results  ?Component Value Date  ? WBC 5.5 11/22/2021  ? HGB 11.8 (L) 11/22/2021  ? HCT 37.6 11/22/2021  ? PLT 448 (H) 11/22/2021  ? GLUCOSE 101 (H) 11/22/2021  ? CHOL 143 11/22/2021  ? TRIG 92 11/22/2021  ? HDL 39 (L) 11/22/2021  ? [place] 86 11/22/2021  ? ALT 9 11/22/2021  ? AST 13 (L) 11/22/2021  ? NA 135 11/22/2021  ? K 4.0 11/22/2021   ? CL 106 11/22/2021  ? CREATININE 0.71 11/22/2021  ? BUN 11 11/22/2021  ? CO2 21 (L) 11/22/2021  ? TSH 0.75 01/31/2019  ? INR 1.0 11/21/2021  ? HGBA1C 6.2 (H) 11/22/2021  ? ? ? ?Assessment & Plan:  ? ? ?See Problem List for Assessment and Plan of chronic medical problems.  ? ? ?

## 2021-12-14 ENCOUNTER — Ambulatory Visit (INDEPENDENT_AMBULATORY_CARE_PROVIDER_SITE_OTHER): Payer: Medicare Other | Admitting: Internal Medicine

## 2021-12-14 ENCOUNTER — Encounter: Payer: Self-pay | Admitting: Internal Medicine

## 2021-12-14 VITALS — BP 140/72 | HR 70 | Temp 98.1°F | Ht 65.0 in

## 2021-12-14 DIAGNOSIS — Z89612 Acquired absence of left leg above knee: Secondary | ICD-10-CM | POA: Diagnosis not present

## 2021-12-14 DIAGNOSIS — E1159 Type 2 diabetes mellitus with other circulatory complications: Secondary | ICD-10-CM

## 2021-12-14 DIAGNOSIS — Z89611 Acquired absence of right leg above knee: Secondary | ICD-10-CM | POA: Diagnosis not present

## 2021-12-14 DIAGNOSIS — T8789 Other complications of amputation stump: Secondary | ICD-10-CM | POA: Diagnosis not present

## 2021-12-14 DIAGNOSIS — L97109 Non-pressure chronic ulcer of unspecified thigh with unspecified severity: Secondary | ICD-10-CM

## 2021-12-14 DIAGNOSIS — I6930 Unspecified sequelae of cerebral infarction: Secondary | ICD-10-CM | POA: Diagnosis not present

## 2021-12-14 DIAGNOSIS — I10 Essential (primary) hypertension: Secondary | ICD-10-CM | POA: Diagnosis not present

## 2021-12-14 DIAGNOSIS — Z8673 Personal history of transient ischemic attack (TIA), and cerebral infarction without residual deficits: Secondary | ICD-10-CM | POA: Insufficient documentation

## 2021-12-14 MED ORDER — NITROGLYCERIN 0.4 MG SL SUBL: 0.4000 mg | SUBLINGUAL_TABLET | SUBLINGUAL | 11 refills | Status: DC | PRN

## 2021-12-14 MED ORDER — APIXABAN 5 MG PO TABS: 5.0000 mg | ORAL_TABLET | Freq: Two times a day (BID) | ORAL | 0 refills | Status: DC

## 2021-12-14 MED ORDER — ROSUVASTATIN CALCIUM 40 MG PO TABS: 40.0000 mg | ORAL_TABLET | Freq: Every day | ORAL | 3 refills | Status: DC

## 2021-12-14 NOTE — Assessment & Plan Note (Signed)
Chronic ?BP well controlled at home - a little higher here ?Advised bringing in her measurements from home to her appts ?Continue metoprolol 12.5 mg bid  ? ? ?

## 2021-12-14 NOTE — Assessment & Plan Note (Signed)
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 and mild aphasia ?stress complaince with eliquis 5 mg BID - Q 12 hrs, plavix until she finishes her current prescription and her BP medications ?She is not currently taking crestor -- advised she needs to restart this for prevention of stroke - prescription sent to pharmacy ?PT evaluated her and no longer needs PT ?Needs OT - ordered today ?Has ST coming to house ?Has HHA as long as she has therapy above, but given her medical problems this is not meeting her needs and she needs to have more services - she has medicaid - referral ordered for [facility] to help assess what services she is eligible for ?

## 2021-12-14 NOTE — Assessment & Plan Note (Signed)
Chronic ?Needs additional services / HHA at home  ?Referral ordered for [facility] to look into services for her ?

## 2021-12-14 NOTE — Patient Outreach (Signed)
[facility] Customer service manager [facility]) Care Management ? ?12/14/2021 ? ?Mercedes [name] ?07/22/1956 ?[id number] ? ? ?Received MD referral from PCP for extra services at home - HHA. Reviewed patient chart and pcp is embedded with [facility] sent referrals. Sent referral to [facility] for follow up.  ? ?[name] ?[facility] Care Management Assistant ?[phone] ?

## 2021-12-14 NOTE — Assessment & Plan Note (Signed)
Chronic diet controlled ?Lab Results  ?Component Value Date  ? HGBA1C 6.2 (H) 11/22/2021  ? ?controlled ?

## 2021-12-14 NOTE — Assessment & Plan Note (Signed)
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

## 2021-12-14 NOTE — Patient Instructions (Addendum)
? ? ?  Call vascular to schedule a follow up visit.  ? ? ?Medications changes include :   restart crestor 40 mg daily.   ? ? ?Your prescription(s) have been sent to your pharmacy.  ? ? ?A referral was ordered for social work through [facility], occupational health for home.     Someone from that office will call you to schedule an appointment.  ? ? ?Return in about 6 months (around 06/15/2022) for 6 month f/u. ? ?

## 2021-12-19 ENCOUNTER — Telehealth: Payer: Self-pay | Admitting: Internal Medicine

## 2021-12-19 DIAGNOSIS — Z9181 History of falling: Secondary | ICD-10-CM

## 2021-12-19 DIAGNOSIS — I2510 Atherosclerotic heart disease of native coronary artery without angina pectoris: Secondary | ICD-10-CM | POA: Diagnosis not present

## 2021-12-19 DIAGNOSIS — Z7902 Long term (current) use of antithrombotics/antiplatelets: Secondary | ICD-10-CM

## 2021-12-19 DIAGNOSIS — I480 Paroxysmal atrial fibrillation: Secondary | ICD-10-CM | POA: Diagnosis not present

## 2021-12-19 DIAGNOSIS — Z89612 Acquired absence of left leg above knee: Secondary | ICD-10-CM | POA: Diagnosis not present

## 2021-12-19 DIAGNOSIS — I739 Peripheral vascular disease, unspecified: Secondary | ICD-10-CM | POA: Diagnosis not present

## 2021-12-19 DIAGNOSIS — S81002D Unspecified open wound, left knee, subsequent encounter: Secondary | ICD-10-CM | POA: Diagnosis not present

## 2021-12-19 DIAGNOSIS — I69351 Hemiplegia and hemiparesis following cerebral infarction affecting right dominant side: Secondary | ICD-10-CM | POA: Diagnosis not present

## 2021-12-19 DIAGNOSIS — Z89611 Acquired absence of right leg above knee: Secondary | ICD-10-CM | POA: Diagnosis not present

## 2021-12-19 DIAGNOSIS — Z950 Presence of cardiac pacemaker: Secondary | ICD-10-CM | POA: Diagnosis not present

## 2021-12-19 DIAGNOSIS — I10 Essential (primary) hypertension: Secondary | ICD-10-CM | POA: Diagnosis not present

## 2021-12-19 DIAGNOSIS — Z7901 Long term (current) use of anticoagulants: Secondary | ICD-10-CM | POA: Diagnosis not present

## 2021-12-19 DIAGNOSIS — E785 Hyperlipidemia, unspecified: Secondary | ICD-10-CM | POA: Diagnosis not present

## 2021-12-19 DIAGNOSIS — G319 Degenerative disease of nervous system, unspecified: Secondary | ICD-10-CM | POA: Diagnosis not present

## 2021-12-19 NOTE — Chronic Care Management (AMB) (Signed)
Offered follow up apt with Mercedes [name], per referral message.Pt declined states she is doing just fine ?

## 2021-12-21 DIAGNOSIS — Z89612 Acquired absence of left leg above knee: Secondary | ICD-10-CM | POA: Diagnosis not present

## 2021-12-21 DIAGNOSIS — I739 Peripheral vascular disease, unspecified: Secondary | ICD-10-CM | POA: Diagnosis not present

## 2021-12-21 DIAGNOSIS — Z7902 Long term (current) use of antithrombotics/antiplatelets: Secondary | ICD-10-CM | POA: Diagnosis not present

## 2021-12-21 DIAGNOSIS — Z89611 Acquired absence of right leg above knee: Secondary | ICD-10-CM | POA: Diagnosis not present

## 2021-12-21 DIAGNOSIS — I69351 Hemiplegia and hemiparesis following cerebral infarction affecting right dominant side: Secondary | ICD-10-CM | POA: Diagnosis not present

## 2021-12-21 DIAGNOSIS — I2510 Atherosclerotic heart disease of native coronary artery without angina pectoris: Secondary | ICD-10-CM | POA: Diagnosis not present

## 2021-12-21 DIAGNOSIS — Z950 Presence of cardiac pacemaker: Secondary | ICD-10-CM | POA: Diagnosis not present

## 2021-12-21 DIAGNOSIS — E785 Hyperlipidemia, unspecified: Secondary | ICD-10-CM | POA: Diagnosis not present

## 2021-12-21 DIAGNOSIS — I480 Paroxysmal atrial fibrillation: Secondary | ICD-10-CM | POA: Diagnosis not present

## 2021-12-21 DIAGNOSIS — S81002D Unspecified open wound, left knee, subsequent encounter: Secondary | ICD-10-CM | POA: Diagnosis not present

## 2021-12-21 DIAGNOSIS — I10 Essential (primary) hypertension: Secondary | ICD-10-CM | POA: Diagnosis not present

## 2021-12-21 DIAGNOSIS — Z9181 History of falling: Secondary | ICD-10-CM | POA: Diagnosis not present

## 2021-12-21 DIAGNOSIS — G319 Degenerative disease of nervous system, unspecified: Secondary | ICD-10-CM | POA: Diagnosis not present

## 2021-12-21 DIAGNOSIS — Z7901 Long term (current) use of anticoagulants: Secondary | ICD-10-CM | POA: Diagnosis not present

## 2021-12-22 ENCOUNTER — Other Ambulatory Visit (HOSPITAL_COMMUNITY): Payer: Self-pay

## 2021-12-22 DIAGNOSIS — I2510 Atherosclerotic heart disease of native coronary artery without angina pectoris: Secondary | ICD-10-CM | POA: Diagnosis not present

## 2021-12-22 DIAGNOSIS — G319 Degenerative disease of nervous system, unspecified: Secondary | ICD-10-CM | POA: Diagnosis not present

## 2021-12-22 DIAGNOSIS — Z89612 Acquired absence of left leg above knee: Secondary | ICD-10-CM | POA: Diagnosis not present

## 2021-12-22 DIAGNOSIS — I10 Essential (primary) hypertension: Secondary | ICD-10-CM | POA: Diagnosis not present

## 2021-12-22 DIAGNOSIS — Z7901 Long term (current) use of anticoagulants: Secondary | ICD-10-CM | POA: Diagnosis not present

## 2021-12-22 DIAGNOSIS — Z9181 History of falling: Secondary | ICD-10-CM | POA: Diagnosis not present

## 2021-12-22 DIAGNOSIS — I480 Paroxysmal atrial fibrillation: Secondary | ICD-10-CM | POA: Diagnosis not present

## 2021-12-22 DIAGNOSIS — Z950 Presence of cardiac pacemaker: Secondary | ICD-10-CM | POA: Diagnosis not present

## 2021-12-22 DIAGNOSIS — I739 Peripheral vascular disease, unspecified: Secondary | ICD-10-CM | POA: Diagnosis not present

## 2021-12-22 DIAGNOSIS — Z7902 Long term (current) use of antithrombotics/antiplatelets: Secondary | ICD-10-CM | POA: Diagnosis not present

## 2021-12-22 DIAGNOSIS — Z89611 Acquired absence of right leg above knee: Secondary | ICD-10-CM | POA: Diagnosis not present

## 2021-12-22 DIAGNOSIS — I69351 Hemiplegia and hemiparesis following cerebral infarction affecting right dominant side: Secondary | ICD-10-CM | POA: Diagnosis not present

## 2021-12-22 DIAGNOSIS — S81002D Unspecified open wound, left knee, subsequent encounter: Secondary | ICD-10-CM | POA: Diagnosis not present

## 2021-12-22 DIAGNOSIS — E785 Hyperlipidemia, unspecified: Secondary | ICD-10-CM | POA: Diagnosis not present

## 2021-12-26 ENCOUNTER — Telehealth: Payer: Self-pay

## 2021-12-26 NOTE — Progress Notes (Signed)
? ? ?Chronic Care Management ?Pharmacy Assistant  ? ?Name: Mercedes [name]  MRN: [id number] DOB: October 02, 1955 ? ? ?Reason for Encounter: Disease State-General Adherence Call ?  ?Recent office visits:  ?12/14/21 Mercedes Rail, MD-PCP [facility]) A referral was ordered for social work through [facility], occupational health for home.   ?Medications changes include :   restart crestor 40 mg daily.   ? ?11/08/21 Mercedes Rail, MD-PCP (pressure sore) Referral ordered for physical therapy for gait training ?Medication changes: start Keflex 500 mg twice daily x7 days ? ?Recent consult visits:  ?08/26/21 Mercedes Grayer, MD-Cardiology ([place]) No orders or med changes ? ?08/04/21 Baglia, Corrina, PA-C (PAD,wound check) No orders, med changes: cephALEXin (KEFLEX) 500 MG capsule ? ?Hospital visits:  ?Medication Reconciliation was completed by comparing discharge summary, patient?s EMR and Pharmacy list, and upon discussion with patient. ? ?Admitted to the hospital on 11/21/21 due to acute ischemic stroke. Discharge date was 11/24/25. Discharged from [facility].   ? ?Medications Discontinued at Hospital Discharge: ?-Stopped cephalexin 500 mg  ? ?Medications that remain the same after Hospital Discharge:??  ?-All other medications will remain the same.   ? ?Medications: ?Outpatient Encounter Medications as of 12/26/2021  ?Medication Sig  ? acetaminophen (TYLENOL) 500 MG tablet Take 500-1,000 mg by mouth every 8 (eight) hours as needed for mild pain or headache.  ? apixaban (ELIQUIS) 5 MG TABS tablet Take 1 tablet (5 mg total) by mouth 2 (two) times daily.  ? clopidogrel (PLAVIX) 75 MG tablet Take 1 tablet (75 mg total) by mouth daily.  ? metoprolol tartrate (LOPRESSOR) 25 MG tablet TAKE 1/2 TABLET BY MOUTH TWICE DAILY. (Patient taking differently: Take 12.5 mg by mouth 2 (two) times daily.)  ? nitroGLYCERIN (NITROSTAT) 0.4 MG SL tablet Place 1 tablet (0.4 mg total) under the tongue every 5 (five) minutes as needed for chest  pain.  ? rosuvastatin (CRESTOR) 40 MG tablet Take 1 tablet (40 mg total) by mouth at bedtime.  ? ?No facility-administered encounter medications on file as of 12/26/2021.  ? ?[place] for General Review Call ? ? ?Chart Review: ? ?Have there been any documented new, changed, or discontinued medications since last visit? Yes, restart crestor 40 mg daily 12/14/21, Metoprolol 25 mg, take 1/2 tablet by mouth twice daily ?Has there been any documented recent hospitalizations or ED visits since last visit with Clinical Pharmacist? Yes, 11/21/21 for acute ischemic stroke ?Brief Summary (including medication and/or Diagnosis changes):Follow up with [facility] ([place]); Follow up with Mercedes Fila, MD (Neurology) in 4 weeks (12/21/2021) ? ? ?Adherence Review: ? ?Does the Clinical Pharmacist Assistant have access to adherence rates? Yes ?Adherence rates for STAR metric medications (List medication(s)/day supply/ last 2 fill dates). ?Adherence rates for medications indicated for disease state being reviewed (List medication(s)/day supply/ last 2 fill dates). ?Does the patient have >5 day gap between last estimated fill dates for any of the above medications or other medication gaps? No ?Reason for medication gaps. ? ? ?Disease State Questions: ? ?Able to connect with Patient? Yes ?Did patient have any problems with their health recently? No ?Note problems and Concerns: ?Have you had any admissions or emergency room visits or worsening of your condition(s) since last visit? Yes ?Details of ED visit, hospital visit and/or worsening condition(s):Yes, 11/21/21 for acute ischemic stroke ?Have you had any visits with new specialists or providers since your last visit? No ?Explain: ?Have you had any new health care  problem(s) since your last visit? No ?New problem(s) reported: ?Have you run out of any of your medications since you last spoke with clinical pharmacist? Yes ?What caused you to  run out of your medications?Patient states that she changed pharmacies. Now getting meds from [facility]. Patient states that the pharmacy called her and told her she need new prescriptions sent to pharmacy. ?Are there any medications you are not taking as prescribed? No ?What kept you from taking your medications as prescribed? ?Are you having any issues or side effects with your medications? No ?Note of issues or side effects: ?Do you have any other health concerns or questions you want to discuss with your Clinical Pharmacist before your next visit? No ?Note additional concerns and questions from Patient. ?Are there any health concerns that you feel we can do a better job addressing? No ?Note Patient's response. ?Are you having any problems with any of the following since the last visit: (select all that apply) ? Ambulating/Walking ? Details:Patient had leg amputated  ?12. Any falls since last visit? No ? Details: ?13. Any increased or uncontrolled pain since last visit? No ? Details: ?14. Next visit Type: telephone ?      Visit with:Clinical Pharmacist Mercedes [name] ?       Date:03/21/22 ?       Time:1pm ? ?15. Additional Details? No ?   ?Care Gaps: ?Colonoscopy-NA ?Diabetic Foot Exam-NA ?Mammogram-NA ?Ophthalmology-07/29/11 ?Dexa Scan - NA ?Annual Well Visit - NA ?Micro albumin-NA ?Hemoglobin A1c- 11/22/21 ? ?Star Rating Drugs: ?Rosuvastatin 40 mg-last fill 12/14/21 90 ds ? ?Mercedes [name] ?Clinical Pharmacist Assistant ?[phone]  ?

## 2021-12-27 ENCOUNTER — Telehealth: Payer: Self-pay

## 2021-12-27 DIAGNOSIS — Z950 Presence of cardiac pacemaker: Secondary | ICD-10-CM | POA: Diagnosis not present

## 2021-12-27 DIAGNOSIS — Z7902 Long term (current) use of antithrombotics/antiplatelets: Secondary | ICD-10-CM | POA: Diagnosis not present

## 2021-12-27 DIAGNOSIS — Z7901 Long term (current) use of anticoagulants: Secondary | ICD-10-CM | POA: Diagnosis not present

## 2021-12-27 DIAGNOSIS — E785 Hyperlipidemia, unspecified: Secondary | ICD-10-CM | POA: Diagnosis not present

## 2021-12-27 DIAGNOSIS — I739 Peripheral vascular disease, unspecified: Secondary | ICD-10-CM | POA: Diagnosis not present

## 2021-12-27 DIAGNOSIS — Z89611 Acquired absence of right leg above knee: Secondary | ICD-10-CM | POA: Diagnosis not present

## 2021-12-27 DIAGNOSIS — G319 Degenerative disease of nervous system, unspecified: Secondary | ICD-10-CM | POA: Diagnosis not present

## 2021-12-27 DIAGNOSIS — Z89612 Acquired absence of left leg above knee: Secondary | ICD-10-CM | POA: Diagnosis not present

## 2021-12-27 DIAGNOSIS — S81002D Unspecified open wound, left knee, subsequent encounter: Secondary | ICD-10-CM | POA: Diagnosis not present

## 2021-12-27 DIAGNOSIS — I10 Essential (primary) hypertension: Secondary | ICD-10-CM | POA: Diagnosis not present

## 2021-12-27 DIAGNOSIS — I2510 Atherosclerotic heart disease of native coronary artery without angina pectoris: Secondary | ICD-10-CM | POA: Diagnosis not present

## 2021-12-27 DIAGNOSIS — I480 Paroxysmal atrial fibrillation: Secondary | ICD-10-CM | POA: Diagnosis not present

## 2021-12-27 DIAGNOSIS — I69351 Hemiplegia and hemiparesis following cerebral infarction affecting right dominant side: Secondary | ICD-10-CM | POA: Diagnosis not present

## 2021-12-27 DIAGNOSIS — Z9181 History of falling: Secondary | ICD-10-CM | POA: Diagnosis not present

## 2021-12-27 NOTE — Telephone Encounter (Signed)
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 She states the pt is trying to get her prothesis but due to her bone protruding from the stump causing the scab, she feels the area will not fully heal unless the pt has the bone shaved down. Appt scheduled for wound check/eval with Dr. Randie Heinz per pt request (She does not want to be seen by PA). Appt scheduled and pt notified.   ?

## 2021-12-28 ENCOUNTER — Telehealth: Payer: Self-pay | Admitting: Internal Medicine

## 2021-12-28 NOTE — Telephone Encounter (Signed)
Pt checking status of home health referral  ? ?Per cma referral was sent to [facility] ? ?Phone [phone] ? ? ?Pt also states a refill request for multiple med refills was faxed from [employer] and she was informed form has not been faxed back ? ?Pt did not know name of meds ?

## 2021-12-29 ENCOUNTER — Other Ambulatory Visit: Payer: Self-pay

## 2021-12-29 DIAGNOSIS — Z9181 History of falling: Secondary | ICD-10-CM | POA: Diagnosis not present

## 2021-12-29 DIAGNOSIS — I10 Essential (primary) hypertension: Secondary | ICD-10-CM | POA: Diagnosis not present

## 2021-12-29 DIAGNOSIS — Z89611 Acquired absence of right leg above knee: Secondary | ICD-10-CM | POA: Diagnosis not present

## 2021-12-29 DIAGNOSIS — I739 Peripheral vascular disease, unspecified: Secondary | ICD-10-CM | POA: Diagnosis not present

## 2021-12-29 DIAGNOSIS — E785 Hyperlipidemia, unspecified: Secondary | ICD-10-CM | POA: Diagnosis not present

## 2021-12-29 DIAGNOSIS — S81002D Unspecified open wound, left knee, subsequent encounter: Secondary | ICD-10-CM | POA: Diagnosis not present

## 2021-12-29 DIAGNOSIS — Z950 Presence of cardiac pacemaker: Secondary | ICD-10-CM | POA: Diagnosis not present

## 2021-12-29 DIAGNOSIS — I2510 Atherosclerotic heart disease of native coronary artery without angina pectoris: Secondary | ICD-10-CM | POA: Diagnosis not present

## 2021-12-29 DIAGNOSIS — G319 Degenerative disease of nervous system, unspecified: Secondary | ICD-10-CM | POA: Diagnosis not present

## 2021-12-29 DIAGNOSIS — Z7902 Long term (current) use of antithrombotics/antiplatelets: Secondary | ICD-10-CM | POA: Diagnosis not present

## 2021-12-29 DIAGNOSIS — I69351 Hemiplegia and hemiparesis following cerebral infarction affecting right dominant side: Secondary | ICD-10-CM | POA: Diagnosis not present

## 2021-12-29 DIAGNOSIS — Z89612 Acquired absence of left leg above knee: Secondary | ICD-10-CM | POA: Diagnosis not present

## 2021-12-29 DIAGNOSIS — I480 Paroxysmal atrial fibrillation: Secondary | ICD-10-CM | POA: Diagnosis not present

## 2021-12-29 DIAGNOSIS — Z7901 Long term (current) use of anticoagulants: Secondary | ICD-10-CM | POA: Diagnosis not present

## 2021-12-29 MED ORDER — APIXABAN 5 MG PO TABS: 5.0000 mg | ORAL_TABLET | Freq: Two times a day (BID) | ORAL | 0 refills | Status: DC

## 2021-12-29 MED ORDER — NITROGLYCERIN 0.4 MG SL SUBL
0.4000 mg | SUBLINGUAL_TABLET | SUBLINGUAL | 11 refills | Status: AC | PRN
Start: 2021-12-29 — End: ?

## 2021-12-29 MED ORDER — ROSUVASTATIN CALCIUM 40 MG PO TABS: 40.0000 mg | ORAL_TABLET | Freq: Every day | ORAL | 3 refills | Status: DC

## 2021-12-29 NOTE — Telephone Encounter (Signed)
Regarding refills patient was told they were sent when she was here on 12/14/21. ? ?I did refax them again to mail order today. ?

## 2022-01-03 DIAGNOSIS — Z89612 Acquired absence of left leg above knee: Secondary | ICD-10-CM | POA: Diagnosis not present

## 2022-01-03 DIAGNOSIS — Z89611 Acquired absence of right leg above knee: Secondary | ICD-10-CM | POA: Diagnosis not present

## 2022-01-04 ENCOUNTER — Telehealth: Payer: Self-pay

## 2022-01-04 DIAGNOSIS — I10 Essential (primary) hypertension: Secondary | ICD-10-CM

## 2022-01-04 NOTE — Telephone Encounter (Signed)
Pt is requesting a refill on: ?metoprolol tartrate (LOPRESSOR) 25 MG tablet ? ?Pharmacy: ?[facility] ) - [place], [place] - [address] ?  ?LOV 12/14/21 ?ROV 06/16/22 ?

## 2022-01-04 NOTE — Addendum Note (Signed)
Addended by: Marcina Millard on: 01/04/2022 04:44 PM ? ? Modules accepted: Orders ? ?

## 2022-01-04 NOTE — Telephone Encounter (Signed)
Called [facility] to see if they can take patient. ? ?Order faxed ?

## 2022-01-04 NOTE — Telephone Encounter (Addendum)
Pt is calling VERY UPSET due to not hearing from the PT or the [facility] services that was ordered 4/19. ? ?Please advise and pt is requesting a call back with an explanation as to why its taking so long. ? ?Please advise ? ?Pt CB [phone] ?

## 2022-01-05 ENCOUNTER — Telehealth: Payer: Self-pay

## 2022-01-05 MED ORDER — METOPROLOL TARTRATE 25 MG PO TABS: 12.5000 mg | ORAL_TABLET | Freq: Two times a day (BID) | ORAL | 1 refills | Status: DC

## 2022-01-05 NOTE — Telephone Encounter (Signed)
I did send in the metoprolol and I do not mind managing her medications, but she does need to see cardiology at least once a year.  She has a significant cardiac history and they should be keeping an eye on everything.  She should see a general cardiologist. ?

## 2022-01-05 NOTE — Telephone Encounter (Addendum)
Patient called to make [place] aware that she has received her prosthetic legs but will wait until her wound check appointment with Dr. Randie Heinz before trying to use them.  The wound is "closed but still sore". ?

## 2022-01-05 NOTE — Telephone Encounter (Signed)
Notified pt w/ MD response. Pt states she's seen Dr. Johney Frame this year, but will definitely see her cardiologist../l,mb ?

## 2022-01-05 NOTE — Telephone Encounter (Signed)
Spoke with patient yesterday and informed her that [facility] would not be able to take her.  I reached out to [facility] and faxed info to them today.  ? ?Patient states I would need to call [facility]  . [phone] ? ?She also said I would need to reach out to [name] (CMN form on line can fax number) ?

## 2022-01-05 NOTE — Telephone Encounter (Signed)
Spoke with patient today. ? ?Form located on line fore personal care services for Medicaid. ? ?Completed form and awaiting Dr. Lawerance Bach signature. ?

## 2022-01-06 NOTE — Telephone Encounter (Signed)
Form faxed in today

## 2022-01-18 ENCOUNTER — Encounter: Payer: Self-pay | Admitting: Vascular Surgery

## 2022-01-18 ENCOUNTER — Ambulatory Visit (INDEPENDENT_AMBULATORY_CARE_PROVIDER_SITE_OTHER): Payer: Medicare Other | Admitting: Vascular Surgery

## 2022-01-18 VITALS — BP 153/83 | HR 59 | Temp 98.0°F | Resp 20 | Ht 65.0 in | Wt 94.0 lb

## 2022-01-18 DIAGNOSIS — I739 Peripheral vascular disease, unspecified: Secondary | ICD-10-CM | POA: Diagnosis not present

## 2022-01-18 DIAGNOSIS — Z89612 Acquired absence of left leg above knee: Secondary | ICD-10-CM | POA: Diagnosis not present

## 2022-01-18 DIAGNOSIS — I10 Essential (primary) hypertension: Secondary | ICD-10-CM | POA: Diagnosis not present

## 2022-01-18 DIAGNOSIS — Z89611 Acquired absence of right leg above knee: Secondary | ICD-10-CM | POA: Diagnosis not present

## 2022-01-18 NOTE — Progress Notes (Signed)
Patient ID: Mercedes [name], female   DOB: Dec 20, 1955, 66 y.o.   MRN: [id number]  Reason for Consult: Follow-up   Referred by Pincus Sanes, MD  Subjective:     HPI:  Mercedes [name] is a 66 y.o. female with a history of bilateral above-knee amputations.  She has recently been fitted for prosthetics and she is actually taken 2 steps recently.  She now has an ulcerated area over the left above-knee amputation where the bone is sticking out.  This causes her quite a bit of discomfort.  This has prevented her from walking on her prosthesis.  She is currently getting around with a motorized cart.  Past Medical History:  Diagnosis Date   Abnormal LFTs    a. in the past when on Lipitor   Anemia    as a young woman   Anxiety    Arnold-Chiari malformation, type I (HCC)    MRI brain 02/2010   CAD (coronary artery disease)    a. NSTEMI 07/2009: LHC - D2 40%, LAD irreg., EF 50% with apical AK (tako-tsubo CM);  b. Inf STEMI (04/2013): Tx Promus DES to CFX;  c. 08/2013 Lexi CL: No ischemia, dist ant/ap/inf-ap infarct, EF  d. 06/2016 NSTEMI with LM disease: s/p CABG on 07/17/2016 w/ LIMA-LAD, SVG-OM, and SVG-dRCA.   DEPRESSION    Dizziness    ? 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 anticoagulated with Eliquis 10/2014   Presence of permanent cardiac pacemaker 10/2014   leadless permanent pacemaker   RA (rheumatoid arthritis) (HCC)    prior tx by Dr. Dareen Piano   Sjogren's syndrome (HCC) 06/02/13   pt denies this (12/01/13)   SLE (systemic lupus erythematosus) (HCC) dx 05/2013   follows with rheum Dareen Piano)   Tachy-brady syndrome [facility])    a.  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   f/u echo 09/2009: EF 50-55%, mild LVH, mod diast dysfxn, mild apical HK   Wears dentures    Wears glasses    Family History  Problem Relation Age of Onset   Arthritis Mother    Emphysema Mother    Arthritis Father    COPD Father    Heart disease Father    Diabetes Paternal Aunt        paternal Aunts   Heart disease Paternal Aunt    Thyroid disease Neg Hx    Past Surgical History:  Procedure Laterality Date   ABDOMINAL AORTAGRAM N/A 11/24/2013   Procedure: ABDOMINAL AORTAGRAM WITH BILATERAL RUNOFF WITH POSSIBLE INTERVENTION;  Surgeon: Chuck Hint, MD;  Location: [facility] CATH LAB;  Service: Cardiovascular;  Laterality: N/A;   ABDOMINAL AORTOGRAM W/LOWER EXTREMITY N/A 04/25/2019   Procedure: ABDOMINAL AORTOGRAM W/LOWER EXTREMITY;  Surgeon: Sherren Kerns, MD;  Location: [facility] INVASIVE CV LAB;  Service: Cardiovascular;  Laterality: N/A;   ABDOMINAL AORTOGRAM W/LOWER EXTREMITY Bilateral 05/15/2019   Procedure: ABDOMINAL AORTOGRAM W/LOWER EXTREMITY;  Surgeon: Nada Libman, MD;  Location: [facility] INVASIVE CV LAB;  Service: Cardiovascular;  Laterality: Bilateral;   ABDOMINAL AORTOGRAM W/LOWER EXTREMITY N/A 08/23/2020  Procedure: ABDOMINAL AORTOGRAM W/LOWER EXTREMITY;  Surgeon: Maeola Harman, MD;  Location: [facility] INVASIVE CV LAB;  Service: Cardiovascular;  Laterality: N/A;   ABDOMINAL AORTOGRAM W/LOWER EXTREMITY N/A 10/29/2020   Procedure: ABDOMINAL AORTOGRAM W/LOWER EXTREMITY;  Surgeon: Sherren Kerns, MD;  Location: [facility] INVASIVE CV LAB;  Service: Cardiovascular;  Laterality: N/A;   AMPUTATION Left 12/02/2013   Procedure: LEFT 1ST TOE AMPUTATION;  Surgeon: Sherren Kerns, MD;  Location: [facility] OR;  Service: Vascular;  Laterality: Left;   AMPUTATION Left 07/16/2019   Procedure: LEFT TRANSMETATARSAL AMPUTATION;  Surgeon: Nadara Mustard, MD;  Location: [facility] OR;  Service: Orthopedics;  Laterality: Left;    AMPUTATION Left 10/01/2019   Procedure: LEFT AMPUTATION ABOVE KNEE;  Surgeon: Sherren Kerns, MD;  Location: [facility] OR;  Service: Vascular;  Laterality: Left;   AMPUTATION Right 08/31/2020   Procedure: Right first toe amputation;  Surgeon: Sherren Kerns, MD;  Location: [facility] OR;  Service: Vascular;  Laterality: Right;   AMPUTATION Right 11/23/2020   Procedure: RIGHT AMPUTATION ABOVE KNEE;  Surgeon: Sherren Kerns, MD;  Location: [facility] OR;  Service: Vascular;  Laterality: Right;   APPLICATION OF WOUND VAC Left 11/03/2019   Procedure: Application Of Wound Vac, left lower extremity;  Surgeon: Sherren Kerns, MD;  Location: [facility] OR;  Service: Vascular;  Laterality: Left;   CARDIAC CATHETERIZATION N/A 07/12/2016   Procedure: Left Heart Cath and Coronary Angiography;  Surgeon: Lennette Bihari, MD;  [facility] INVASIVE CV LAB;  Service: Cardiovascular;  Laterality: N/A;   CARDIAC CATHETERIZATION N/A 07/14/2016   Procedure: Intravascular Ultrasound/IVUS;  Surgeon: Yvonne Kendall, MD;  Location: [facility] INVASIVE CV LAB;  Service: Cardiovascular;  Laterality: N/A;   COLONOSCOPY     CORONARY ANGIOPLASTY  04/2013   CORONARY ARTERY BYPASS GRAFT N/A 07/17/2016   Procedure: CORONARY ARTERY BYPASS GRAFTING (CABG)x3 using right greater saphenous vein harvested endoscopiclly and left internal mammary artery;  Surgeon: Kerin Perna, MD;  Location: [facility] OR;  Service: Open Heart Surgery;  Laterality: N/A;   ENDARTERECTOMY FEMORAL Left 05/12/2019   Procedure: LEFT Femoral Endarterectomy;  Surgeon: Sherren Kerns, MD;  Location: [facility] OR;  Service: Vascular;  Laterality: Left;   FEMORAL-POPLITEAL BYPASS GRAFT Left 12/02/2013   Procedure:  LEFT FEMORAL-BELOW KNEE POPLITEAL ARTERY BYPASS GRAFT;  Surgeon: Sherren Kerns, MD;  Location: [facility] OR;  Service: Vascular;  Laterality: Left;   FEMORAL-POPLITEAL BYPASS GRAFT Left 09/25/2019   Procedure: THROMBECTOMY and PROPATEN BYPASS  FEMORAL TO BELOW KNEE POPLITEAL ARTERY BYPASS GRAFT LEFT  LEG;  Surgeon: Larina Earthly, MD;  Location: [facility] OR;  Service: Vascular;  Laterality: Left;   FEMORAL-POPLITEAL BYPASS GRAFT Right 08/24/2020   Procedure: RIGHT FEMORAL-BELOW KNEE POPLITEAL ARTERY BYPASS USING GORE PROPATEN GRAFT;  Surgeon: Maeola Harman, MD;  Location: [facility] OR;  Service: Vascular;  Laterality: Right;   FEMORAL-POPLITEAL BYPASS GRAFT Right 11/01/2020   Procedure: Thrombectomy and Revision of Right Femoral to below knee Bypass with Interpostional graft to Tibial / peroneal Trunk and Endarterectomy Tibial [place]on Settle Trunk.;  Surgeon: Sherren Kerns, MD;  Location: [facility] OR;  Service: Vascular;  Laterality: Right;   INTRAOPERATIVE ARTERIOGRAM Right 11/01/2020   Procedure: ANTEGRADE RIGHT LOWER EXTREMITY INTRA OPERATIVE ARTERIOGRAM;  Surgeon: Sherren Kerns, MD;  Location: [facility] OR;  Service: Vascular;  Laterality: Right;   LEFT HEART CATHETERIZATION WITH CORONARY ANGIOGRAM N/A 05/19/2013   Procedure: LEFT HEART CATHETERIZATION WITH CORONARY ANGIOGRAM;  Surgeon: Micheline Chapman, MD;  Location: [facility] CATH LAB;  Service: Cardiovascular;  Laterality: N/A;   LEFT HEART CATHETERIZATION WITH CORONARY ANGIOGRAM N/A 10/28/2014   Procedure: LEFT HEART CATHETERIZATION WITH CORONARY ANGIOGRAM;  Surgeon: Iran Ouch, MD;  Location: [facility] CATH LAB;  Service: Cardiovascular;  Laterality: N/A;   LOWER EXTREMITY ANGIOGRAM Left 05/12/2019   Procedure: Lower Extremity Angiogram;  Surgeon: Sherren Kerns, MD;  Location: [facility] OR;  Service: Vascular;  Laterality: Left;   PATCH ANGIOPLASTY Left 05/12/2019   Procedure: Left Femoral Patch Angioplasty USING CADAVERIC SAPHENOUS VEIN;  Surgeon: Sherren Kerns, MD;  Location: [facility] OR;  Service: Vascular;  Laterality: Left;   PERCUTANEOUS CORONARY STENT INTERVENTION (PCI-S)  05/19/2013   Procedure: PERCUTANEOUS CORONARY STENT INTERVENTION (PCI-S);  Surgeon: Micheline Chapman, MD;  Location: [facility] CATH LAB;  Service: Cardiovascular;;   PERIPHERAL VASCULAR INTERVENTION  Right 10/29/2020   Procedure: PERIPHERAL VASCULAR INTERVENTION;  Surgeon: Sherren Kerns, MD;  Location: [facility] INVASIVE CV LAB;  Service: Cardiovascular;  Laterality: Right;  common iliac   PERMANENT PACEMAKER INSERTION N/A 11/04/2014   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   TEE WITHOUT CARDIOVERSION N/A 07/17/2016   Procedure: TRANSESOPHAGEAL ECHOCARDIOGRAM (TEE);  Surgeon: Kerin Perna, MD;  Location: [facility] OR;  Service: Open Heart Surgery;  Laterality: N/A;   THROMBECTOMY FEMORAL ARTERY Left 05/12/2019   Procedure: Left Ilio-Femoral Artery and Femoral-popliteal Graft Thrombectomy;  Surgeon: Sherren Kerns, MD;  Location: [facility] OR;  Service: Vascular;  Laterality: Left;   THROMBECTOMY OF BYPASS GRAFT FEMORAL- POPLITEAL ARTERY Right 11/01/2020   Procedure: POSSIBLE THROMBOLYSIS VERSUS OPEN THROMBECTOMY AND REVSION OF RIGHT FEMORAL-POPLITEAL ARTERY BYPASS;  Surgeon: Sherren Kerns, MD;  Location: [facility] OR;  Service: Vascular;  Laterality: Right;   TUBAL LIGATION     VISCERAL ANGIOGRAPHY N/A 04/25/2019   Procedure: MESENTRIC  ANGIOGRAPHY;  Surgeon: Sherren Kerns, MD;  Location: [facility] INVASIVE CV LAB;  Service: Cardiovascular;  Laterality: N/A;   WOUND DEBRIDEMENT Left 11/03/2019   Procedure: Debridement Wound of left above the knee amputation;  Surgeon: Sherren Kerns, MD;  Location: [facility] OR;  Service: Vascular;  Laterality: Left;    Short Social History:  Social History   Tobacco Use   Smoking status: Former    Types: Cigarettes    Quit date: 2019    Years since quitting: 4.3   Smokeless tobacco: Never   Tobacco comments:       Substance Use Topics   Alcohol use: No    Alcohol/week: 0.0 standard drinks    Allergies  Allergen Reactions   Brilinta [Ticagrelor] Anaphylaxis, Shortness Of Breath and Other (See Comments)    Also, chest tightness   Latex Rash   Tape Rash   Zetia [Ezetimibe]     fatigue    Current Outpatient Medications  Medication Sig Dispense  Refill   acetaminophen (TYLENOL) 500 MG tablet Take 500-1,000 mg by mouth every 8 (eight) hours as needed for mild pain or headache.     apixaban (ELIQUIS) 5 MG TABS tablet Take 1 tablet (5 mg total) by mouth 2 (two) times daily. 180 tablet 0   clopidogrel (PLAVIX) 75 MG tablet Take 1 tablet (75 mg total) by mouth daily. 30 tablet 0   metoprolol tartrate (LOPRESSOR) 25 MG tablet Take 0.5 tablets (12.5 mg total) by mouth 2 (two) times daily. 180 tablet 1   nitroGLYCERIN (NITROSTAT) 0.4 MG SL tablet Place 1 tablet (0.4 mg total) under the tongue every 5 (five) minutes as needed for chest pain. 25 tablet  11   rosuvastatin (CRESTOR) 40 MG tablet Take 1 tablet (40 mg total) by mouth at bedtime. 90 tablet 3   No current facility-administered medications for this visit.    Review of Systems  Constitutional:  Constitutional negative. HENT: HENT negative.  Eyes: Eyes negative.  Cardiovascular: Cardiovascular negative.  GI: Gastrointestinal negative.  Musculoskeletal: Musculoskeletal negative.  Skin: Positive for wound.  Neurological: Neurological negative. Hematologic: Hematologic/lymphatic negative.  Psychiatric: Psychiatric negative.       Objective:  Objective  Vitals:   01/18/22 0932  BP: (!) 153/83  Pulse: (!) 59  Resp: 20  Temp: 98 F (36.7 C)  SpO2: 98%    Physical Exam HENT:     Head: Normocephalic.     Nose: Nose normal.  Eyes:     Pupils: Pupils are equal, round, and reactive to light.  Abdominal:     General: Abdomen is flat.     Palpations: Abdomen is soft.  Musculoskeletal:     Comments: Well-healed right above-knee amputation, left above-knee amputation has beginnings of an ulcerated area which is attached to the bone beneath it and there is quite a bit of tension.  Skin:    General: Skin is warm.     Capillary Refill: Capillary refill takes less than 2 seconds.  Neurological:     Mental Status: She is alert.  Psychiatric:        Mood and Affect: Mood normal.     Data: No studies today     Assessment/Plan:     66 year old female with history of bilateral above-knee amputations now with ulcerative area on the left which appears to be due to too much bone.  I discussed with her revision of the above-knee amputation taking the bone higher back at least a few centimeters she will need a few months for this to heal prior to using her prosthetic but she should be able to walk on the right prosthetic with the help of a walker.  She is hoping to wait until after her birthday and we will get this scheduled soon given that there is no overt signs of infection.     Maeola Harman MD [facility]

## 2022-01-26 ENCOUNTER — Inpatient Hospital Stay: Payer: Medicare Other | Admitting: Neurology

## 2022-01-26 ENCOUNTER — Encounter: Payer: Self-pay | Admitting: Neurology

## 2022-02-18 DIAGNOSIS — I10 Essential (primary) hypertension: Secondary | ICD-10-CM | POA: Diagnosis not present

## 2022-02-18 DIAGNOSIS — Z89612 Acquired absence of left leg above knee: Secondary | ICD-10-CM | POA: Diagnosis not present

## 2022-02-18 DIAGNOSIS — Z89611 Acquired absence of right leg above knee: Secondary | ICD-10-CM | POA: Diagnosis not present

## 2022-02-22 ENCOUNTER — Ambulatory Visit (INDEPENDENT_AMBULATORY_CARE_PROVIDER_SITE_OTHER): Payer: Medicare Other | Admitting: Vascular Surgery

## 2022-02-22 VITALS — BP 168/103 | HR 84 | Temp 98.6°F | Resp 20

## 2022-02-22 DIAGNOSIS — I779 Disorder of arteries and arterioles, unspecified: Secondary | ICD-10-CM | POA: Diagnosis not present

## 2022-02-22 DIAGNOSIS — Z89612 Acquired absence of left leg above knee: Secondary | ICD-10-CM

## 2022-02-22 MED ORDER — GABAPENTIN 100 MG PO CAPS: 100.0000 mg | ORAL_CAPSULE | Freq: Three times a day (TID) | ORAL | 3 refills | Status: DC

## 2022-02-22 NOTE — H&P (View-Only) (Signed)
 Patient ID: Mercedes [name], female   DOB: 06/25/1956, 66 y.o.   MRN: [id number]  Reason for Consult: No chief complaint on file.   Referred by Burns, Stacy J, MD  Subjective:     HPI:  Mercedes [name] is a 66 y.o. female history of bilateral above-knee amputations fitted for prosthetics.  She does have an ulcerated area over the left above-knee amputation and I recently evaluated her last month for this issue.  She continues to get around using motorized cart.  She denies any fevers or chills.  Past Medical History:  Diagnosis Date   Abnormal LFTs    a. in the past when on Lipitor   Anemia    as a young woman   Anxiety    Arnold-Chiari malformation, type I (HCC)    MRI brain 02/2010   CAD (coronary artery disease)    a. NSTEMI 07/2009: LHC - D2 40%, LAD irreg., EF 50% with apical AK (tako-tsubo CM);  b. Inf STEMI (04/2013): Tx Promus DES to CFX;  c. 08/2013 Lexi CL: No ischemia, dist ant/ap/inf-ap infarct, EF  d. 06/2016 NSTEMI with LM disease: s/p CABG on 07/17/2016 w/ LIMA-LAD, SVG-OM, and SVG-dRCA.   DEPRESSION    Dizziness    ? 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 anticoagulated with Eliquis 10/2014   Presence of permanent cardiac pacemaker 10/2014   leadless permanent pacemaker   RA (rheumatoid arthritis) (HCC)    prior tx by Dr. Anderson   Sjogren's syndrome (HCC) 06/02/13   pt denies this (12/01/13)   SLE (systemic lupus erythematosus) (HCC) dx 05/2013   follows with rheum (Anderson)   Tachy-brady syndrome (HCC)    a. 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   f/u echo 09/2009:  EF 50-55%, mild LVH, mod diast dysfxn, mild apical HK   Wears dentures    Wears glasses    Family History  Problem Relation Age of Onset   Arthritis Mother    Emphysema Mother    Arthritis Father    COPD Father    Heart disease Father    Diabetes Paternal Aunt        paternal Aunts   Heart disease Paternal Aunt    Thyroid disease Neg Hx    Past Surgical History:  Procedure Laterality Date   ABDOMINAL AORTAGRAM N/A 11/24/2013   Procedure: ABDOMINAL AORTAGRAM WITH BILATERAL RUNOFF WITH POSSIBLE INTERVENTION;  Surgeon: Christopher S Dickson, MD;  Location: [facility];  Service: Cardiovascular;  Laterality: N/A;   ABDOMINAL AORTOGRAM W/LOWER EXTREMITY N/A 04/25/2019   Procedure: ABDOMINAL AORTOGRAM W/LOWER EXTREMITY;  Surgeon: Fields, Charles E, MD;  Location: [facility];  Service: Cardiovascular;  Laterality: N/A;   ABDOMINAL AORTOGRAM W/LOWER EXTREMITY Bilateral 05/15/2019   Procedure: ABDOMINAL AORTOGRAM W/LOWER EXTREMITY;  Surgeon: Brabham, Vance W, MD;  Location: [facility];  Service: Cardiovascular;  Laterality: Bilateral;   ABDOMINAL AORTOGRAM W/LOWER EXTREMITY N/A 08/23/2020   Procedure: ABDOMINAL AORTOGRAM W/LOWER EXTREMITY;  Surgeon: Mylie Mccurley Christopher, MD;  Location: [facility];    Service: Cardiovascular;  Laterality: N/A;   ABDOMINAL AORTOGRAM W/LOWER EXTREMITY N/A 10/29/2020   Procedure: ABDOMINAL AORTOGRAM W/LOWER EXTREMITY;  Surgeon: Fields, Charles E, MD;  Location: [facility];  Service: Cardiovascular;  Laterality: N/A;   AMPUTATION Left 12/02/2013   Procedure: LEFT 1ST TOE AMPUTATION;  Surgeon: Charles E Fields, MD;  Location: [facility];  Service: Vascular;  Laterality: Left;   AMPUTATION Left 07/16/2019   Procedure: LEFT TRANSMETATARSAL AMPUTATION;  Surgeon: Duda, Marcus V, MD;  Location: [facility];  Service: Orthopedics;  Laterality: Left;   AMPUTATION Left 10/01/2019   Procedure: LEFT AMPUTATION ABOVE KNEE;  Surgeon: Fields, Charles E, MD;   Location: [facility];  Service: Vascular;  Laterality: Left;   AMPUTATION Right 08/31/2020   Procedure: Right first toe amputation;  Surgeon: Fields, Charles E, MD;  Location: [facility];  Service: Vascular;  Laterality: Right;   AMPUTATION Right 11/23/2020   Procedure: RIGHT AMPUTATION ABOVE KNEE;  Surgeon: Fields, Charles E, MD;  [facility]: [facility];  Service: Vascular;  Laterality: Right;   APPLICATION OF WOUND VAC Left 11/03/2019   Procedure: Application Of Wound Vac, left lower extremity;  Surgeon: Fields, Charles E, MD;  Location: [facility];  Service: Vascular;  Laterality: Left;   CARDIAC CATHETERIZATION N/A 07/12/2016   Procedure: Left Heart Cath and Coronary Angiography;  Surgeon: Thomas A Kelly, MD;  Location: [facility];  Service: Cardiovascular;  Laterality: N/A;   CARDIAC CATHETERIZATION N/A 07/14/2016   Procedure: Intravascular Ultrasound/IVUS;  Surgeon: Christopher End, MD;  [facility]: [facility];  Service: Cardiovascular;  Laterality: N/A;   COLONOSCOPY     CORONARY ANGIOPLASTY  04/2013   CORONARY ARTERY BYPASS GRAFT N/A 07/17/2016   Procedure: CORONARY ARTERY BYPASS GRAFTING (CABG)x3 using right greater saphenous vein harvested endoscopiclly and left internal mammary artery;  Surgeon: Peter Van Trigt, MD;  Location: [facility];  Service: Open Heart Surgery;  Laterality: N/A;   ENDARTERECTOMY FEMORAL Left 05/12/2019   Procedure: LEFT Femoral Endarterectomy;  Surgeon: Fields, Charles E, MD;  Location: [facility];  Service: Vascular;  Laterality: Left;   FEMORAL-POPLITEAL BYPASS GRAFT Left 12/02/2013   Procedure:  LEFT FEMORAL-BELOW KNEE POPLITEAL ARTERY BYPASS GRAFT;  Surgeon: Charles E Fields, MD;  [facility]: [facility];  Service: Vascular;  Laterality: Left;   FEMORAL-POPLITEAL BYPASS GRAFT Left 09/25/2019   Procedure: THROMBECTOMY and PROPATEN BYPASS  FEMORAL TO BELOW KNEE POPLITEAL ARTERY BYPASS GRAFT LEFT LEG;  Surgeon: Early, Todd F, MD;  Location: [facility];  Service: Vascular;  Laterality: Left;    FEMORAL-POPLITEAL BYPASS GRAFT Right 08/24/2020   Procedure: RIGHT FEMORAL-BELOW KNEE POPLITEAL ARTERY BYPASS USING GORE PROPATEN GRAFT;  Surgeon: Araiya Tilmon Christopher, MD;  Location: [facility];  Service: Vascular;  Laterality: Right;   FEMORAL-POPLITEAL BYPASS GRAFT Right 11/01/2020   Procedure: Thrombectomy and Revision of Right Femoral to below knee Bypass with Interpostional graft to Tibial / peroneal Trunk and Endarterectomy Tibial /Peroneal Trunk.;  Surgeon: Fields, Charles E, MD;  Location: [facility];  Service: Vascular;  Laterality: Right;   INTRAOPERATIVE ARTERIOGRAM Right 11/01/2020   Procedure: ANTEGRADE RIGHT LOWER EXTREMITY INTRA OPERATIVE ARTERIOGRAM;  Surgeon: Fields, Charles E, MD;  [facility]: [facility];  Service: Vascular;  Laterality: Right;   LEFT HEART CATHETERIZATION WITH CORONARY ANGIOGRAM N/A 05/19/2013   Procedure: LEFT HEART CATHETERIZATION WITH CORONARY ANGIOGRAM;  Surgeon: Michael D Cooper, MD;  [facility]: [facility];  Service: Cardiovascular;  Laterality: N/A;   LEFT HEART CATHETERIZATION WITH CORONARY ANGIOGRAM N/A 10/28/2014   Procedure:   LEFT HEART CATHETERIZATION WITH CORONARY ANGIOGRAM;  Surgeon: Iran Ouch, MD;  Location: [facility] CATH LAB;  Service: Cardiovascular;  Laterality: N/A;   LOWER EXTREMITY ANGIOGRAM Left 05/12/2019   Procedure: Lower Extremity Angiogram;  Surgeon: Sherren Kerns, MD;  Location: [facility] OR;  Service: Vascular;  Laterality: Left;   PATCH ANGIOPLASTY Left 05/12/2019   Procedure: Left Femoral Patch Angioplasty USING CADAVERIC SAPHENOUS VEIN;  Surgeon: Sherren Kerns, MD;  Location: [facility] OR;  Service: Vascular;  Laterality: Left;   PERCUTANEOUS CORONARY STENT INTERVENTION (PCI-S)  05/19/2013   Procedure: PERCUTANEOUS CORONARY STENT INTERVENTION (PCI-S);  Surgeon: Micheline Chapman, MD;  Location: [facility] CATH LAB;  Service: Cardiovascular;;   PERIPHERAL VASCULAR INTERVENTION Right 10/29/2020   Procedure: PERIPHERAL VASCULAR INTERVENTION;  Surgeon: Sherren Kerns, MD;   Location: [facility] INVASIVE CV LAB;  Service: Cardiovascular;  Laterality: Right;  common iliac   PERMANENT PACEMAKER INSERTION N/A 11/04/2014   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   TEE WITHOUT CARDIOVERSION N/A 07/17/2016   Procedure: TRANSESOPHAGEAL ECHOCARDIOGRAM (TEE);  Surgeon: Kerin Perna, MD;  Location: [facility] OR;  Service: Open Heart Surgery;  Laterality: N/A;   THROMBECTOMY FEMORAL ARTERY Left 05/12/2019   Procedure: Left Ilio-Femoral Artery and Femoral-popliteal Graft Thrombectomy;  Surgeon: Sherren Kerns, MD;  Location: [facility] OR;  Service: Vascular;  Laterality: Left;   THROMBECTOMY OF BYPASS GRAFT FEMORAL- POPLITEAL ARTERY Right 11/01/2020   Procedure: POSSIBLE THROMBOLYSIS VERSUS OPEN THROMBECTOMY AND REVSION OF RIGHT FEMORAL-POPLITEAL ARTERY BYPASS;  Surgeon: Sherren Kerns, MD;  Location: [facility] OR;  Service: Vascular;  Laterality: Right;   TUBAL LIGATION     VISCERAL ANGIOGRAPHY N/A 04/25/2019   Procedure: MESENTRIC  ANGIOGRAPHY;  Surgeon: Sherren Kerns, MD;  Location: [facility];  Service: Cardiovascular;  Laterality: N/A;   WOUND DEBRIDEMENT Left 11/03/2019   Procedure: Debridement Wound of left above the knee amputation;  Surgeon: Sherren Kerns, MD;  Location: [facility] OR;  Service: Vascular;  Laterality: Left;    Short Social History:  Social History   Tobacco Use   Smoking status: Former    Types: Cigarettes    Quit date: 2019    Years since quitting: 4.4   Smokeless tobacco: Never   Tobacco comments:       Substance Use Topics   Alcohol use: No    Alcohol/week: 0.0 standard drinks of alcohol    Allergies  Allergen Reactions   Brilinta [Ticagrelor] Anaphylaxis, Shortness Of Breath and Other (See Comments)    Also, chest tightness   Latex Rash   Tape Rash   Zetia [Ezetimibe]     fatigue    Current Outpatient Medications  Medication Sig Dispense Refill   acetaminophen (TYLENOL) 500 MG tablet Take 500-1,000 mg by mouth every 8  (eight) hours as needed for mild pain or headache.     apixaban (ELIQUIS) 5 MG TABS tablet Take 1 tablet (5 mg total) by mouth 2 (two) times daily. 180 tablet 0   clopidogrel (PLAVIX) 75 MG tablet Take 1 tablet (75 mg total) by mouth daily. 30 tablet 0   metoprolol tartrate (LOPRESSOR) 25 MG tablet Take 0.5 tablets (12.5 mg total) by mouth 2 (two) times daily. 180 tablet 1   nitroGLYCERIN (NITROSTAT) 0.4 MG SL tablet Place 1 tablet (0.4 mg total) under the tongue every 5 (five) minutes as needed for chest pain. 25 tablet 11   rosuvastatin (CRESTOR) 40 MG tablet Take 1 tablet (40 mg total) by mouth  at bedtime. 90 tablet 3   No current facility-administered medications for this visit.    Review of Systems  Constitutional:  Constitutional negative. HENT: HENT negative.  Eyes: Eyes negative.  Respiratory: Respiratory negative.  Cardiovascular: Cardiovascular negative.  GI: Gastrointestinal negative.  Musculoskeletal: Musculoskeletal negative.  Skin: Positive for wound.  Neurological:       Right lower extremity phantom pain Hematologic: Hematologic/lymphatic negative.  Psychiatric: Psychiatric negative.        Objective:  Objective   Vitals:   02/22/22 1452  BP: (!) 168/103  Pulse: 84  Resp: 20  Temp: 98.6 F (37 C)  SpO2: 98%     Physical Exam HENT:     Head: Normocephalic.     Mouth/Throat:     Mouth: Mucous membranes are moist.  Eyes:     Pupils: Pupils are equal, round, and reactive to light.  Cardiovascular:     Rate and Rhythm: Normal rate.  Pulmonary:     Effort: Pulmonary effort is normal.  Abdominal:     General: Abdomen is flat.  Musculoskeletal:     Comments: Well-healed right above-knee amputation, left above-knee amputation has beginnings of an ulcerated area which is attached to the bone beneath it and there is quite a bit of tension.   Skin:    General: Skin is warm.  Neurological:     General: No focal deficit present.     Mental Status: She is  alert.  Psychiatric:        Mood and Affect: Mood normal.     Data: No recent studies     Assessment/Plan:    66 year old female with history of bilateral above-knee amputations now with wound on the left.  Plan will be for revision of left above-knee amputation and will need to hold anticoagulation for 48 hours prior to this.  I have also sent Neurontin to her pharmacy for right lower extremity phantom pain 100 mg 3 times daily which we have room to increase if necessary.  I discussed the risk and benefits as well as need for revision of her prosthetic after we perform above-knee amputation revision and she demonstrates good understanding.     Maeola Harman MD [facility]

## 2022-02-22 NOTE — Progress Notes (Signed)
Patient ID: Mercedes [name], female   DOB: 05-28-1956, 66 y.o.   MRN: [id number]  Reason for Consult: No chief complaint on file.   Referred by Mercedes Sanes, MD  Subjective:     HPI:  Mercedes [name] is a 66 y.o. female history of bilateral above-knee amputations fitted for prosthetics.  She does have an ulcerated area over the left above-knee amputation and I recently evaluated her last month for this issue.  She continues to get around using motorized cart.  She denies any fevers or chills.  Past Medical History:  Diagnosis Date   Abnormal LFTs    a. in the past when on Lipitor   Anemia    as a young woman   Anxiety    Arnold-Chiari malformation, type I (HCC)    MRI brain 02/2010   CAD (coronary artery disease)    a. NSTEMI 07/2009: LHC - D2 40%, LAD irreg., EF 50% with apical AK (tako-tsubo CM);  b. Inf STEMI (04/2013): Tx Promus DES to CFX;  c. 08/2013 Lexi CL: No ischemia, dist ant/ap/inf-ap infarct, EF  d. 06/2016 NSTEMI with LM disease: s/p CABG on 07/17/2016 w/ LIMA-LAD, SVG-OM, and SVG-dRCA.   DEPRESSION    Dizziness    ? 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 anticoagulated with Eliquis 10/2014   Presence of permanent cardiac pacemaker 10/2014   leadless permanent pacemaker   RA (rheumatoid arthritis) (HCC)    prior tx by Dr. Dareen Piano   Sjogren's syndrome (HCC) 06/02/13   pt denies this (12/01/13)   SLE (systemic lupus erythematosus) (HCC) dx 05/2013   follows with rheum Dareen Piano)   Tachy-brady syndrome [facility])    a. 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   f/u echo 09/2009:  EF 50-55%, mild LVH, mod diast dysfxn, mild apical HK   Wears dentures    Wears glasses    Family History  Problem Relation Age of Onset   Arthritis Mother    Emphysema Mother    Arthritis Father    COPD Father    Heart disease Father    Diabetes Paternal Aunt        paternal Aunts   Heart disease Paternal Aunt    Thyroid disease Neg Hx    Past Surgical History:  Procedure Laterality Date   ABDOMINAL AORTAGRAM N/A 11/24/2013   Procedure: ABDOMINAL AORTAGRAM WITH BILATERAL RUNOFF WITH POSSIBLE INTERVENTION;  Surgeon: Chuck Hint, MD;  Location: [facility] CATH LAB;  Service: Cardiovascular;  Laterality: N/A;   ABDOMINAL AORTOGRAM W/LOWER EXTREMITY N/A 04/25/2019   Procedure: ABDOMINAL AORTOGRAM W/LOWER EXTREMITY;  Surgeon: Sherren Kerns, MD;  Location: [facility] INVASIVE CV LAB;  Service: Cardiovascular;  Laterality: N/A;   ABDOMINAL AORTOGRAM W/LOWER EXTREMITY Bilateral 05/15/2019   Procedure: ABDOMINAL AORTOGRAM W/LOWER EXTREMITY;  Surgeon: Nada Libman, MD;  Location: [facility] INVASIVE CV LAB;  Service: Cardiovascular;  Laterality: Bilateral;   ABDOMINAL AORTOGRAM W/LOWER EXTREMITY N/A 08/23/2020   Procedure: ABDOMINAL AORTOGRAM W/LOWER EXTREMITY;  Surgeon: Maeola Harman, MD;  Location: [facility] INVASIVE CV LAB;  Service: Cardiovascular;  Laterality: N/A;   ABDOMINAL AORTOGRAM W/LOWER EXTREMITY N/A 10/29/2020   Procedure: ABDOMINAL AORTOGRAM W/LOWER EXTREMITY;  Surgeon: Sherren Kerns, MD;  Location: [facility] INVASIVE CV LAB;  Service: Cardiovascular;  Laterality: N/A;   AMPUTATION Left 12/02/2013   Procedure: LEFT 1ST TOE AMPUTATION;  Surgeon: Sherren Kerns, MD;  Location: [facility] OR;  Service: Vascular;  Laterality: Left;   AMPUTATION Left 07/16/2019   Procedure: LEFT TRANSMETATARSAL AMPUTATION;  Surgeon: Nadara Mustard, MD;  Location: [facility] OR;  Service: Orthopedics;  Laterality: Left;   AMPUTATION Left 10/01/2019   Procedure: LEFT AMPUTATION ABOVE KNEE;  Surgeon: Sherren Kerns, MD;   Location: [facility] OR;  Service: Vascular;  Laterality: Left;   AMPUTATION Right 08/31/2020   Procedure: Right first toe amputation;  Surgeon: Sherren Kerns, MD;  Location: [facility] OR;  Service: Vascular;  Laterality: Right;   AMPUTATION Right 11/23/2020   Procedure: RIGHT AMPUTATION ABOVE KNEE;  Surgeon: Sherren Kerns, MD;  Location: [facility] OR;  Service: Vascular;  Laterality: Right;   APPLICATION OF WOUND VAC Left 11/03/2019   Procedure: Application Of Wound Vac, left lower extremity;  Surgeon: Sherren Kerns, MD;  Location: [facility] OR;  Service: Vascular;  Laterality: Left;   CARDIAC CATHETERIZATION N/A 07/12/2016   Procedure: Left Heart Cath and Coronary Angiography;  Surgeon: Lennette Bihari, MD;  Location: [facility] INVASIVE CV LAB;  Service: Cardiovascular;  Laterality: N/A;   CARDIAC CATHETERIZATION N/A 07/14/2016   Procedure: Intravascular Ultrasound/IVUS;  Surgeon: Yvonne Kendall, MD;  Location: [facility] INVASIVE CV LAB;  Service: Cardiovascular;  Laterality: N/A;   COLONOSCOPY     CORONARY ANGIOPLASTY  04/2013   CORONARY ARTERY BYPASS GRAFT N/A 07/17/2016   Procedure: CORONARY ARTERY BYPASS GRAFTING (CABG)x3 using right greater saphenous vein harvested endoscopiclly and left internal mammary artery;  Surgeon: Kerin Perna, MD;  Location: [facility] OR;  Service: Open Heart Surgery;  Laterality: N/A;   ENDARTERECTOMY FEMORAL Left 05/12/2019   Procedure: LEFT Femoral Endarterectomy;  Surgeon: Sherren Kerns, MD;  Location: [facility] OR;  Service: Vascular;  Laterality: Left;   FEMORAL-POPLITEAL BYPASS GRAFT Left 12/02/2013   Procedure:  LEFT FEMORAL-BELOW KNEE POPLITEAL ARTERY BYPASS GRAFT;  Surgeon: Sherren Kerns, MD;  Location: [facility] OR;  Service: Vascular;  Laterality: Left;   FEMORAL-POPLITEAL BYPASS GRAFT Left 09/25/2019   Procedure: THROMBECTOMY and PROPATEN BYPASS  FEMORAL TO BELOW KNEE POPLITEAL ARTERY BYPASS GRAFT LEFT LEG;  Surgeon: Larina Earthly, MD;  Location: [facility] OR;  Service: Vascular;  Laterality: Left;    FEMORAL-POPLITEAL BYPASS GRAFT Right 08/24/2020   Procedure: RIGHT FEMORAL-BELOW KNEE POPLITEAL ARTERY BYPASS USING GORE PROPATEN GRAFT;  Surgeon: Maeola Harman, MD;  Location: [facility] OR;  Service: Vascular;  Laterality: Right;   FEMORAL-POPLITEAL BYPASS GRAFT Right 11/01/2020   Procedure: Thrombectomy and Revision of Right Femoral to below knee Bypass with Interpostional graft to Tibial / peroneal Trunk and Endarterectomy Tibial Weston Settle Trunk.;  Surgeon: Sherren Kerns, MD;  Location: [facility] OR;  Service: Vascular;  Laterality: Right;   INTRAOPERATIVE ARTERIOGRAM Right 11/01/2020   Procedure: ANTEGRADE RIGHT LOWER EXTREMITY INTRA OPERATIVE ARTERIOGRAM;  Surgeon: Sherren Kerns, MD;  Location: [facility] OR;  Service: Vascular;  Laterality: Right;   LEFT HEART CATHETERIZATION WITH CORONARY ANGIOGRAM N/A 05/19/2013   Procedure: LEFT HEART CATHETERIZATION WITH CORONARY ANGIOGRAM;  Surgeon: Micheline Chapman, MD;  Location: [facility] CATH LAB;  Service: Cardiovascular;  Laterality: N/A;   LEFT HEART CATHETERIZATION WITH CORONARY ANGIOGRAM N/A 10/28/2014   Procedure:  LEFT HEART CATHETERIZATION WITH CORONARY ANGIOGRAM;  Surgeon: Iran Ouch, MD;  Location: [facility] CATH LAB;  Service: Cardiovascular;  Laterality: N/A;   LOWER EXTREMITY ANGIOGRAM Left 05/12/2019   Procedure: Lower Extremity Angiogram;  Surgeon: Sherren Kerns, MD;  Location: [facility] OR;  Service: Vascular;  Laterality: Left;   PATCH ANGIOPLASTY Left 05/12/2019   Procedure: Left Femoral Patch Angioplasty USING CADAVERIC SAPHENOUS VEIN;  Surgeon: Sherren Kerns, MD;  Location: [facility] OR;  Service: Vascular;  Laterality: Left;   PERCUTANEOUS CORONARY STENT INTERVENTION (PCI-S)  05/19/2013   Procedure: PERCUTANEOUS CORONARY STENT INTERVENTION (PCI-S);  Surgeon: Micheline Chapman, MD;  Location: [facility] CATH LAB;  Service: Cardiovascular;;   PERIPHERAL VASCULAR INTERVENTION Right 10/29/2020   Procedure: PERIPHERAL VASCULAR INTERVENTION;  Surgeon: Sherren Kerns, MD;   Location: [facility] INVASIVE CV LAB;  Service: Cardiovascular;  Laterality: Right;  common iliac   PERMANENT PACEMAKER INSERTION N/A 11/04/2014   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   TEE WITHOUT CARDIOVERSION N/A 07/17/2016   Procedure: TRANSESOPHAGEAL ECHOCARDIOGRAM (TEE);  Surgeon: Kerin Perna, MD;  Location: [facility] OR;  Service: Open Heart Surgery;  Laterality: N/A;   THROMBECTOMY FEMORAL ARTERY Left 05/12/2019   Procedure: Left Ilio-Femoral Artery and Femoral-popliteal Graft Thrombectomy;  Surgeon: Sherren Kerns, MD;  Location: [facility] OR;  Service: Vascular;  Laterality: Left;   THROMBECTOMY OF BYPASS GRAFT FEMORAL- POPLITEAL ARTERY Right 11/01/2020   Procedure: POSSIBLE THROMBOLYSIS VERSUS OPEN THROMBECTOMY AND REVSION OF RIGHT FEMORAL-POPLITEAL ARTERY BYPASS;  Surgeon: Sherren Kerns, MD;  Location: [facility] OR;  Service: Vascular;  Laterality: Right;   TUBAL LIGATION     VISCERAL ANGIOGRAPHY N/A 04/25/2019   Procedure: MESENTRIC  ANGIOGRAPHY;  Surgeon: Sherren Kerns, MD;  Location: [facility] INVASIVE CV LAB;  Service: Cardiovascular;  Laterality: N/A;   WOUND DEBRIDEMENT Left 11/03/2019   Procedure: Debridement Wound of left above the knee amputation;  Surgeon: Sherren Kerns, MD;  Location: [facility] OR;  Service: Vascular;  Laterality: Left;    Short Social History:  Social History   Tobacco Use   Smoking status: Former    Types: Cigarettes    Quit date: 2019    Years since quitting: 4.4   Smokeless tobacco: Never   Tobacco comments:       Substance Use Topics   Alcohol use: No    Alcohol/week: 0.0 standard drinks of alcohol    Allergies  Allergen Reactions   Brilinta [Ticagrelor] Anaphylaxis, Shortness Of Breath and Other (See Comments)    Also, chest tightness   Latex Rash   Tape Rash   Zetia [Ezetimibe]     fatigue    Current Outpatient Medications  Medication Sig Dispense Refill   acetaminophen (TYLENOL) 500 MG tablet Take 500-1,000 mg by mouth every 8  (eight) hours as needed for mild pain or headache.     apixaban (ELIQUIS) 5 MG TABS tablet Take 1 tablet (5 mg total) by mouth 2 (two) times daily. 180 tablet 0   clopidogrel (PLAVIX) 75 MG tablet Take 1 tablet (75 mg total) by mouth daily. 30 tablet 0   metoprolol tartrate (LOPRESSOR) 25 MG tablet Take 0.5 tablets (12.5 mg total) by mouth 2 (two) times daily. 180 tablet 1   nitroGLYCERIN (NITROSTAT) 0.4 MG SL tablet Place 1 tablet (0.4 mg total) under the tongue every 5 (five) minutes as needed for chest pain. 25 tablet 11   rosuvastatin (CRESTOR) 40 MG tablet Take 1 tablet (40 mg total) by mouth  at bedtime. 90 tablet 3   No current facility-administered medications for this visit.    Review of Systems  Constitutional:  Constitutional negative. HENT: HENT negative.  Eyes: Eyes negative.  Respiratory: Respiratory negative.  Cardiovascular: Cardiovascular negative.  GI: Gastrointestinal negative.  Musculoskeletal: Musculoskeletal negative.  Skin: Positive for wound.  Neurological:       Right lower extremity phantom pain Hematologic: Hematologic/lymphatic negative.  Psychiatric: Psychiatric negative.        Objective:  Objective   Vitals:   02/22/22 1452  BP: (!) 168/103  Pulse: 84  Resp: 20  Temp: 98.6 F (37 C)  SpO2: 98%     Physical Exam HENT:     Head: Normocephalic.     Mouth/Throat:     Mouth: Mucous membranes are moist.  Eyes:     Pupils: Pupils are equal, round, and reactive to light.  Cardiovascular:     Rate and Rhythm: Normal rate.  Pulmonary:     Effort: Pulmonary effort is normal.  Abdominal:     General: Abdomen is flat.  Musculoskeletal:     Comments: Well-healed right above-knee amputation, left above-knee amputation has beginnings of an ulcerated area which is attached to the bone beneath it and there is quite a bit of tension.   Skin:    General: Skin is warm.  Neurological:     General: No focal deficit present.     Mental Status: She is  alert.  Psychiatric:        Mood and Affect: Mood normal.     Data: No recent studies     Assessment/Plan:    66 year old female with history of bilateral above-knee amputations now with wound on the left.  Plan will be for revision of left above-knee amputation and will need to hold anticoagulation for 48 hours prior to this.  I have also sent Neurontin to her pharmacy for right lower extremity phantom pain 100 mg 3 times daily which we have room to increase if necessary.  I discussed the risk and benefits as well as need for revision of her prosthetic after we perform above-knee amputation revision and she demonstrates good understanding.     Maeola Harman MD [facility]

## 2022-02-27 ENCOUNTER — Other Ambulatory Visit: Payer: Self-pay

## 2022-02-27 DIAGNOSIS — I739 Peripheral vascular disease, unspecified: Secondary | ICD-10-CM

## 2022-03-13 ENCOUNTER — Other Ambulatory Visit: Payer: Self-pay

## 2022-03-13 ENCOUNTER — Encounter (HOSPITAL_COMMUNITY): Payer: Self-pay | Admitting: Vascular Surgery

## 2022-03-13 NOTE — Anesthesia Preprocedure Evaluation (Addendum)
Anesthesia Evaluation  Patient identified by MRN, date of birth, ID band Patient awake    Reviewed: Allergy & Precautions, NPO status , Patient's Chart, lab work & pertinent test results  Airway Mallampati: II  TM Distance: >3 FB Neck ROM: Full    Dental no notable dental hx. (+) Edentulous Upper, Missing, Dental Advisory Given,    Pulmonary former smoker,    Pulmonary exam normal breath sounds clear to auscultation       Cardiovascular hypertension, + Past MI and + Peripheral Vascular Disease  Normal cardiovascular exam+ dysrhythmias Atrial Fibrillation + pacemaker  Rhythm:Regular Rate:Normal  Echo 11/22/21:  1. Left ventricular ejection fraction, by estimation, is 55 to 60%. The left ventricle has normal function. The left ventricle has no regional wall motion abnormalities. Left ventricular diastolic function could not be evaluated.  2. Right ventricular systolic function is normal. The right ventricular size is normal. There is normal pulmonary artery systolic pressure. The estimated right ventricular systolic pressure is 18.2 mmHg.  3. The mitral valve is grossly normal. Trivial mitral valve regurgitation. No evidence of mitral stenosis.  4. The aortic valve is grossly normal. Aortic valve regurgitation is not visualized. No aortic stenosis is present.  5. The inferior vena cava is normal in size with greater than 50% respiratory variability, suggesting right atrial pressure of 3 mmHg.    Neuro/Psych  Headaches,  Neuromuscular disease    GI/Hepatic GERD  ,  Endo/Other    Renal/GU Lab Results      Component                Value               Date                      CREATININE               0.71                11/22/2021                BUN                      11                  11/22/2021                NA                       135                 11/22/2021                K                        4.0                  11/22/2021                CL                       106                 11/22/2021                CO2  21 (L)              11/22/2021                Musculoskeletal  (+) Arthritis ,   Abdominal   Peds  Hematology   Anesthesia Other Findings All: Latex Tape Brilenta  Reproductive/Obstetrics                           Anesthesia Physical Anesthesia Plan  ASA: 3  Anesthesia Plan: General   Post-op Pain Management: Tylenol PO (pre-op)* and Ketamine IV*   Induction:   PONV Risk Score and Plan: 4 or greater and Treatment may vary due to age or medical condition, Midazolam and Ondansetron  Airway Management Planned: LMA  Additional Equipment: None  Intra-op Plan:   Post-operative Plan:   Informed Consent: I have reviewed the patients History and Physical, chart, labs and discussed the procedure including the risks, benefits and alternatives for the proposed anesthesia with the patient or authorized representative who has indicated his/her understanding and acceptance.     Dental advisory given  Plan Discussed with:   Anesthesia Plan Comments: (See APP note by Joslyn Hy, FNP )      Anesthesia Quick Evaluation

## 2022-03-13 NOTE — Progress Notes (Signed)
Anesthesia Chart Review:    Case: [id number] Date/Time: 03/14/22 0950   Procedure: REVISION LEFT ABOVE KNEE AMPUTATION (Left: Knee)   Anesthesia type: General   Pre-op diagnosis: CRITICAL LIMB ISCHEMIA   [facility] 12 / [facility]   Surgeons: Maeola Harman, MD       DISCUSSION: Pt is 66 years old with hx CAD (s/p DES to CX 2015; CABG 2017), atrial fibrillation, HTN, Takotsubo cardiomyopathy, tachy-brady syndrome, CVA, PAD, SLE.  S/p bilateral AKA  Pt had a CVA March 2023  Pt has leadless pacemaker with battery at recommended replacement time since 2020. By notes, pt elected not to have it explanted or replaced and is stable at this time. Pacemaker is no longer monitored or interrogated.   Pt to hold eliquis 2 days before surgery - last dose 03/08/22   PROVIDERS: - PCP is Pincus Sanes, MD - EP cardiologist is Hillis Range, MD. Last office visit 08/26/21 - Cardiologist was Olga Millers, MD but pt has not seen him since 2021. Last regular cardiology eval was during hospitalization January 2022. Pt does follow regularly with EP cardiology  LABS: Will be obtained day of surgery    EKG 11/21/21: Sinus rhythm. RBBB and LAFB. ST elevation, consider inferior injury - EKG from ED visit in context of CVA. Per H&P and discharge summary EKG did not show ST elevation.   CV: Echo 11/22/21:  1. Left ventricular ejection fraction, by estimation, is 55 to 60%. The left ventricle has normal function. The left ventricle has no regional wall motion abnormalities. Left ventricular diastolic function could not be evaluated.  2. Right ventricular systolic function is normal. The right ventricular size is normal. There is normal pulmonary artery systolic pressure. The estimated right ventricular systolic pressure is 18.2 mmHg.  3. The mitral valve is grossly normal. Trivial mitral valve regurgitation. No evidence of mitral stenosis.  4. The aortic valve is grossly normal. Aortic valve  regurgitation is not visualized. No aortic stenosis is present.  5. The inferior vena cava is normal in size with greater than 50% respiratory variability, suggesting right atrial pressure of 3 mmHg.   Carotid duplex 11/22/21:  - Right Carotid: Velocities in the right ICA are consistent with a 1-39% stenosis.  - Left Carotid: Velocities in the left ICA are consistent with a 40-59% stenosis.  - Vertebrals:  Bilateral vertebral arteries demonstrate antegrade flow.  - Subclavians: Normal flow hemodynamics were seen in bilateral subclavian arteries.   Nuclear stress test 09/02/20:  There was no ST segment deviation noted during stress. No T wave inversion was noted during stress. Findings consistent with prior myocardial infarction. This is an intermediate risk study. The left ventricular ejection fraction is normal (55-65%). There is a medium sized, moderate defect that is non-reversible in the apex, there is associated wall motion abnormality consistent with prior infarct. There is an anteroseptal rest perfusion defect. Their anterolateral stress defect may be artifactual in the setting of patient movement between rest and stress, as noted by the change in location of the diaphragmatic attenuation. - Study suggestive of prior infarction with associated wall motion abnormality. - Less evidence to suggest anterolateral ischemia.   - Poor quality study. - Compared to 2015 study report; infarct size may be larger.  Past Medical History:  Diagnosis Date   Abnormal LFTs    a. in the past when on Lipitor   Anemia    as a young woman   Anxiety    Arnold-Chiari  malformation, type I (HCC)    MRI brain 02/2010   CAD (coronary artery disease)    a. NSTEMI 07/2009: LHC - D2 40%, LAD irreg., EF 50% with apical AK (tako-tsubo CM);  b. Inf STEMI (04/2013): Tx Promus DES to CFX;  c. 08/2013 Lexi CL: No ischemia, dist ant/ap/inf-ap infarct, EF  d. 06/2016 NSTEMI with LM disease: s/p CABG on 07/17/2016 w/  LIMA-LAD, SVG-OM, and SVG-dRCA.   DEPRESSION    Dizziness    ? 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 anticoagulated with Eliquis 10/2014   Presence of permanent cardiac pacemaker 10/2014   leadless permanent pacemaker   RA (rheumatoid arthritis) (HCC)    prior tx by Dr. Dareen Piano   Sjogren's syndrome (HCC) 06/02/2013   pt denies this (12/01/13)   SLE (systemic lupus erythematosus) (HCC) dx 05/2013   follows with rheum Dareen Piano)   Stroke [facility])    March 2023, weakness in right arm is improving   Tachy-brady syndrome (HCC)    a. 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   f/u echo 09/2009: EF 50-55%, mild LVH, mod diast dysfxn, mild apical HK   Wears dentures    Wears glasses     Past Surgical History:  Procedure Laterality Date   ABDOMINAL AORTAGRAM N/A 11/24/2013   Procedure: ABDOMINAL AORTAGRAM WITH BILATERAL RUNOFF WITH POSSIBLE INTERVENTION;  Surgeon: Chuck Hint, MD;  Location: [facility] CATH LAB;  Service: Cardiovascular;  Laterality: N/A;   ABDOMINAL AORTOGRAM W/LOWER EXTREMITY N/A 04/25/2019   Procedure: ABDOMINAL AORTOGRAM W/LOWER EXTREMITY;  Surgeon: Sherren Kerns, MD;  [facility] INVAS[facility] CV LAB;  Service: Cardiovascular;  Laterality: N/A;   ABDOMINAL AORTOGRAM W/LOWER EXTREMITY Bilateral 05/15/2019   Procedure: ABDOMINAL AORTOGRAM W/LOWER EXTREMITY;  Surgeon: Nada Libman, MD;  [facility] INVAS[facility] CV LAB;  Service: Cardiovascular;  Laterality: Bilateral;   ABDOMINAL AORTOGRAM W/LOWER EXTREMITY N/A 08/23/2020   Procedure: ABDOMINAL AORTOGRAM W/LOWER EXTREMITY;  Surgeon:  Maeola Harman, MD;  Location: [facility] INVASIVE CV LAB;  Service: Cardiovascular;  Laterality: N/A;   ABDOMINAL AORTOGRAM W/LOWER EXTREMITY N/A 10/29/2020   Procedure: ABDOMINAL AORTOGRAM W/LOWER EXTREMITY;  Surgeon: Sherren Kerns, MD;  [facility]AS[facility] CV LAB;  Service: Cardiovascular;  Laterality: N/A;   AMPUTATION Left 12/02/2013   Procedure: LEFT 1ST TOE AMPUTATION;  Surgeon: Sherren Kerns, MD;  Location: [facility] OR;  Service: Vascular;  Laterality: Left;   AMPUTATION Left 07/16/2019   Procedure: LEFT TRANSMETATARSAL AMPUTATION;  Surgeon: Nadara Mustard, MD;  Location: [facility] OR;  Service: Orthopedics;  Laterality: Left;   AMPUTATION Left 10/01/2019   Procedure: LEFT AMPUTATION ABOVE KNEE;  Surgeon: Sherren Kerns, MD;  Location: [facility] OR;  Service: Vascular;  Laterality: Left;   AMPUTATION Right 08/31/2020   Procedure: Right first toe amputation;  Surgeon: Sherren Kerns, MD;  Location: [facility] OR;  Service: Vascular;  Laterality: Right;   AMPUTATION Right 11/23/2020   Procedure: RIGHT AMPUTATION ABOVE KNEE;  Surgeon: Sherren Kerns, MD;  Location: [facility] OR;  Service: Vascular;  Laterality: Right;   APPLICATION OF  WOUND VAC Left 11/03/2019   Procedure: Application Of Wound Vac, left lower extremity;  Surgeon: Sherren Kerns, MD;  Location: [facility] OR;  Service: Vascular;  Laterality: Left;   CARDIAC CATHETERIZATION N/A 07/12/2016   Procedure: Left Heart Cath and Coronary Angiography;  Surgeon: Lennette Bihari, MD;  Location: [facility] INVASIVE CV LAB;  Service: Cardiovascular;  Laterality: N/A;   CARDIAC CATHETERIZATION N/A 07/14/2016   Procedure: Intravascular Ultrasound/IVUS;  Surgeon: Yvonne Kendall, MD;  [facility] INVAS[facility] CV LAB;  Service: Cardiovascular;  Laterality: N/A;   COLONOSCOPY     CORONARY ANGIOPLASTY  04/2013   CORONARY ARTERY BYPASS GRAFT N/A 07/17/2016   Procedure: CORONARY ARTERY BYPASS GRAFTING (CABG)x3 using right greater saphenous vein harvested endoscopiclly and left internal  mammary artery;  Surgeon: Kerin Perna, MD;  Location: [facility] OR;  Service: Open Heart Surgery;  Laterality: N/A;   ENDARTERECTOMY FEMORAL Left 05/12/2019   Procedure: LEFT Femoral Endarterectomy;  Surgeon: Sherren Kerns, MD;  Location: [facility] OR;  Service: Vascular;  Laterality: Left;   FEMORAL-POPLITEAL BYPASS GRAFT Left 12/02/2013   Procedure:  LEFT FEMORAL-BELOW KNEE POPLITEAL ARTERY BYPASS GRAFT;  Surgeon: Sherren Kerns, MD;  Location: [facility] OR;  Service: Vascular;  Laterality: Left;   FEMORAL-POPLITEAL BYPASS GRAFT Left 09/25/2019   Procedure: THROMBECTOMY and PROPATEN BYPASS  FEMORAL TO BELOW KNEE POPLITEAL ARTERY BYPASS GRAFT LEFT LEG;  Surgeon: Larina Earthly, MD;  [facility];  Service: Vascular;  Laterality: Left;   FEMORAL-POPLITEAL BYPASS GRAFT Right 08/24/2020   Procedure: RIGHT FEMORAL-BELOW KNEE POPLITEAL ARTERY BYPASS USING GORE PROPATEN GRAFT;  Surgeon: Maeola Harman, MD;  Location: [name] OR;  Service: Vascular;  Laterality: Right;   FEMORAL-POPLITEAL BYPASS GRAFT Right 11/01/2020   Procedure: Thrombectomy and Revision of Right Femoral to below knee Bypass with Interpostional graft to Tibial / peroneal Trunk and Endarterectomy Tibial Weston Settle Trunk.;  Surgeon: Sherren Kerns, MD;  Location: [facility] OR;  Service: Vascular;  Laterality: Right;   INTRAOPERATIVE ARTERIOGRAM Right 11/01/2020   Procedure: ANTEGRADE RIGHT LOWER EXTREMITY INTRA OPERATIVE ARTERIOGRAM;  Surgeon: Sherren Kerns, MD;  Location: [facility] OR;  Service: Vascular;  Laterality: Right;   LEFT HEART CATHETERIZATION WITH CORONARY ANGIOGRAM N/A 05/19/2013   Procedure: LEFT HEART CATHETERIZATION WITH CORONARY ANGIOGRAM;  Surgeon: Micheline Chapman, MD;  Location: [name] CATH LAB;  Service: Cardiovascular;  Laterality: N/A;   LEFT HEART CATHETERIZATION WITH CORONARY ANGIOGRAM N/A 10/28/2014   Procedure: LEFT HEART CATHETERIZATION WITH CORONARY ANGIOGRAM;  Surgeon: Iran Ouch, MD;  [facility] CATH [facility];  Service:  Cardiovascular;  Laterality: N/A;   LOWER EXTREMITY ANGIOGRAM Left 05/12/2019   Procedure: Lower Extremity Angiogram;  Surgeon: Sherren Kerns, MD;  Location: [facility] OR;  Service: Vascular;  Laterality: Left;   PATCH ANGIOPLASTY Left 05/12/2019   Procedure: Left Femoral Patch Angioplasty USING CADAVERIC SAPHENOUS VEIN;  Surgeon: Sherren Kerns, MD;  Location: [facility] OR;  Service: Vascular;  Laterality: Left;   PERCUTANEOUS CORONARY STENT INTERVENTION (PCI-S)  05/19/2013   Procedure: PERCUTANEOUS CORONARY STENT INTERVENTION (PCI-S);  Surgeon: Micheline Chapman, MD;  Location: [facility] CATH LAB;  Service: Cardiovascular;;   PERIPHERAL VASCULAR INTERVENTION Right 10/29/2020   Procedure: PERIPHERAL VASCULAR INTERVENTION;  Surgeon: Sherren Kerns, MD;  Location: [facility] INVASIVE CV LAB;  Service: Cardiovascular;  Laterality: Right;  common iliac   PERMANENT PACEMAKER INSERTION N/A 11/04/2014   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   TEE WITHOUT CARDIOVERSION N/A 07/17/2016   Procedure: TRANSESOPHAGEAL ECHOCARDIOGRAM (TEE);  Surgeon: Ivin Poot, MD;  Location: [place];  Service: Open Heart Surgery;  Laterality: N/A;   THROMBECTOMY FEMORAL ARTERY Left 05/12/2019   Procedure: Left Ilio-Femoral Artery and Femoral-popliteal Graft Thrombectomy;  Surgeon: Elam Dutch, MD;  Location: [facility] OR;  Service: Vascular;  Laterality: Left;   THROMBECTOMY OF BYPASS GRAFT FEMORAL- POPLITEAL ARTERY Right 11/01/2020   Procedure: POSSIBLE THROMBOLYSIS VERSUS OPEN THROMBECTOMY AND REVSION OF RIGHT FEMORAL-POPLITEAL ARTERY BYPASS;  Surgeon: Elam Dutch, MD;  Location: [place];  Service: Vascular;  Laterality: Right;   TUBAL LIGATION     VISCERAL ANGIOGRAPHY N/A 04/25/2019   Procedure: MESENTRIC  ANGIOGRAPHY;  Surgeon: Elam Dutch, MD;  Location: [place] CV LAB;  Service: Cardiovascular;  Laterality: N/A;   WOUND DEBRIDEMENT Left 11/03/2019   Procedure: Debridement Wound of left above the knee  amputation;  Surgeon: Elam Dutch, MD;  Location: [facility] OR;  Service: Vascular;  Laterality: Left;    MEDICATIONS: No current facility-administered medications for this encounter.    acetaminophen (TYLENOL) 500 MG tablet   apixaban (ELIQUIS) 5 MG TABS tablet   gabapentin (NEURONTIN) 100 MG capsule   metoprolol tartrate (LOPRESSOR) 25 MG tablet   nitroGLYCERIN (NITROSTAT) 0.4 MG SL tablet   clopidogrel (PLAVIX) 75 MG tablet   rosuvastatin (CRESTOR) 40 MG tablet    Pt with complicated medical history. Will need further assessment by assigned anesthesiologist. If labs acceptable day of surgery and no acute changes, I anticipate pt can proceed with surgery as scheduled.   Willeen Cass, PhD, FNP-BC [facility]/Anesthesiology Phone: [phone] 03/13/2022 12:05 PM

## 2022-03-13 NOTE — Progress Notes (Signed)
Spoke with pt for pre-op call. Pt has of hx of CAD with CABG in 2017. Hx of A-fib, is on Eliquis. Was instructed to hold 2 days prior. Last dose per pt was 03/09/22, PM dose. Pt has a pacemaker which Dr. Johney Frame noted in a visit dated 08/26/21 that her pacemaker has not been functional for 3 years and pt has chosen not to have it replaced or removed. Pt states she is not diabetic.   Shower instructions given to pt and she voiced understanding. Pt is a bilateral amputee.   Chart sent Anesthesia PA for review of recent EKG.

## 2022-03-14 ENCOUNTER — Inpatient Hospital Stay (HOSPITAL_COMMUNITY): Payer: Medicare Other | Admitting: Emergency Medicine

## 2022-03-14 ENCOUNTER — Other Ambulatory Visit: Payer: Self-pay

## 2022-03-14 ENCOUNTER — Encounter (HOSPITAL_COMMUNITY)
Admission: RE | Disposition: A | Discharge status: Home Health | Payer: Self-pay | Source: Home / Self Care | Attending: Vascular Surgery

## 2022-03-14 ENCOUNTER — Inpatient Hospital Stay (HOSPITAL_COMMUNITY)
Admission: RE | Admit: 2022-03-14 | Discharge: 2022-03-17 | DRG: 241 | Disposition: A | Discharge status: Home Health | Payer: Medicare Other | Attending: Vascular Surgery | Admitting: Vascular Surgery

## 2022-03-14 DIAGNOSIS — T8789 Other complications of amputation stump: Secondary | ICD-10-CM | POA: Diagnosis not present

## 2022-03-14 DIAGNOSIS — Z825 Family history of asthma and other chronic lower respiratory diseases: Secondary | ICD-10-CM

## 2022-03-14 DIAGNOSIS — I4891 Unspecified atrial fibrillation: Secondary | ICD-10-CM | POA: Diagnosis not present

## 2022-03-14 DIAGNOSIS — Z833 Family history of diabetes mellitus: Secondary | ICD-10-CM

## 2022-03-14 DIAGNOSIS — Z8673 Personal history of transient ischemic attack (TIA), and cerebral infarction without residual deficits: Secondary | ICD-10-CM | POA: Diagnosis not present

## 2022-03-14 DIAGNOSIS — Z8261 Family history of arthritis: Secondary | ICD-10-CM | POA: Diagnosis not present

## 2022-03-14 DIAGNOSIS — I252 Old myocardial infarction: Secondary | ICD-10-CM | POA: Diagnosis not present

## 2022-03-14 DIAGNOSIS — Z8249 Family history of ischemic heart disease and other diseases of the circulatory system: Secondary | ICD-10-CM | POA: Diagnosis not present

## 2022-03-14 DIAGNOSIS — I495 Sick sinus syndrome: Secondary | ICD-10-CM | POA: Diagnosis not present

## 2022-03-14 DIAGNOSIS — I10 Essential (primary) hypertension: Secondary | ICD-10-CM | POA: Diagnosis not present

## 2022-03-14 DIAGNOSIS — Z87891 Personal history of nicotine dependence: Secondary | ICD-10-CM | POA: Diagnosis not present

## 2022-03-14 DIAGNOSIS — Z89611 Acquired absence of right leg above knee: Secondary | ICD-10-CM

## 2022-03-14 DIAGNOSIS — Z951 Presence of aortocoronary bypass graft: Secondary | ICD-10-CM

## 2022-03-14 DIAGNOSIS — Z91048 Other nonmedicinal substance allergy status: Secondary | ICD-10-CM

## 2022-03-14 DIAGNOSIS — I70222 Atherosclerosis of native arteries of extremities with rest pain, left leg: Principal | ICD-10-CM | POA: Diagnosis present

## 2022-03-14 DIAGNOSIS — G546 Phantom limb syndrome with pain: Secondary | ICD-10-CM | POA: Diagnosis not present

## 2022-03-14 DIAGNOSIS — Y835 Amputation of limb(s) as the cause of abnormal reaction of the patient, or of later complication, without mention of misadventure at the time of the procedure: Secondary | ICD-10-CM | POA: Diagnosis present

## 2022-03-14 DIAGNOSIS — F32A Depression, unspecified: Secondary | ICD-10-CM | POA: Diagnosis present

## 2022-03-14 DIAGNOSIS — T8189XA Other complications of procedures, not elsewhere classified, initial encounter: Secondary | ICD-10-CM | POA: Diagnosis present

## 2022-03-14 DIAGNOSIS — Z888 Allergy status to other drugs, medicaments and biological substances status: Secondary | ICD-10-CM | POA: Diagnosis not present

## 2022-03-14 DIAGNOSIS — M069 Rheumatoid arthritis, unspecified: Secondary | ICD-10-CM | POA: Diagnosis present

## 2022-03-14 DIAGNOSIS — K219 Gastro-esophageal reflux disease without esophagitis: Secondary | ICD-10-CM | POA: Diagnosis present

## 2022-03-14 DIAGNOSIS — Z91040 Latex allergy status: Secondary | ICD-10-CM | POA: Diagnosis not present

## 2022-03-14 DIAGNOSIS — I2510 Atherosclerotic heart disease of native coronary artery without angina pectoris: Secondary | ICD-10-CM | POA: Diagnosis not present

## 2022-03-14 DIAGNOSIS — R519 Headache, unspecified: Secondary | ICD-10-CM | POA: Diagnosis present

## 2022-03-14 DIAGNOSIS — Z950 Presence of cardiac pacemaker: Secondary | ICD-10-CM

## 2022-03-14 DIAGNOSIS — Z89612 Acquired absence of left leg above knee: Secondary | ICD-10-CM

## 2022-03-14 DIAGNOSIS — Z955 Presence of coronary angioplasty implant and graft: Secondary | ICD-10-CM

## 2022-03-14 DIAGNOSIS — I739 Peripheral vascular disease, unspecified: Principal | ICD-10-CM | POA: Diagnosis present

## 2022-03-14 DIAGNOSIS — M329 Systemic lupus erythematosus, unspecified: Secondary | ICD-10-CM | POA: Diagnosis not present

## 2022-03-14 DIAGNOSIS — E785 Hyperlipidemia, unspecified: Secondary | ICD-10-CM | POA: Diagnosis present

## 2022-03-14 DIAGNOSIS — Z7901 Long term (current) use of anticoagulants: Secondary | ICD-10-CM

## 2022-03-14 DIAGNOSIS — I480 Paroxysmal atrial fibrillation: Secondary | ICD-10-CM | POA: Diagnosis not present

## 2022-03-14 DIAGNOSIS — T8781 Dehiscence of amputation stump: Secondary | ICD-10-CM | POA: Diagnosis not present

## 2022-03-14 DIAGNOSIS — Z7902 Long term (current) use of antithrombotics/antiplatelets: Secondary | ICD-10-CM

## 2022-03-14 DIAGNOSIS — F419 Anxiety disorder, unspecified: Secondary | ICD-10-CM | POA: Diagnosis present

## 2022-03-14 DIAGNOSIS — Z79899 Other long term (current) drug therapy: Secondary | ICD-10-CM

## 2022-03-14 DIAGNOSIS — I4510 Unspecified right bundle-branch block: Secondary | ICD-10-CM | POA: Diagnosis present

## 2022-03-14 HISTORY — PX: AMPUTATION: SHX166

## 2022-03-14 HISTORY — DX: Cerebral infarction, unspecified: I63.9

## 2022-03-14 LAB — COMPREHENSIVE METABOLIC PANEL
ALT: 11 U/L (ref 0–44)
AST: 24 U/L (ref 15–41)
Albumin: 2.9 g/dL — ABNORMAL LOW (ref 3.5–5.0)
Alkaline Phosphatase: 79 U/L (ref 38–126)
Anion gap: 10 (ref 5–15)
BUN: 12 mg/dL (ref 8–23)
CO2: 21 mmol/L — ABNORMAL LOW (ref 22–32)
Calcium: 9 mg/dL (ref 8.9–10.3)
Chloride: 106 mmol/L (ref 98–111)
Creatinine, Ser: 0.71 mg/dL (ref 0.44–1.00)
GFR, Estimated: >60 mL/min (ref >60)
Glucose, Bld: 96 mg/dL (ref 70–99)
Potassium: 4.4 mmol/L (ref 3.5–5.1)
Sodium: 137 mmol/L (ref 135–145)
Total Bilirubin: 0.5 mg/dL (ref 0.3–1.2)
Total Protein: 7.7 g/dL (ref 6.5–8.1)

## 2022-03-14 LAB — CBC
HCT: 41.7 % (ref 36.0–46.0)
Hemoglobin: 13 g/dL (ref 12.0–15.0)
MCH: 24.3 pg — ABNORMAL LOW (ref 26.0–34.0)
MCHC: 31.2 g/dL (ref 30.0–36.0)
MCV: 77.8 fL — ABNORMAL LOW (ref 80.0–100.0)
Platelets: 348 10*3/uL (ref 150–400)
RBC: 5.36 MIL/uL — ABNORMAL HIGH (ref 3.87–5.11)
RDW: 16.9 % — ABNORMAL HIGH (ref 11.5–15.5)
WBC: 5.9 10*3/uL (ref 4.0–10.5)
nRBC: 0 % (ref 0.0–0.2)

## 2022-03-14 LAB — APTT: aPTT: 26 seconds (ref 24–36)

## 2022-03-14 LAB — TYPE AND SCREEN
ABO/RH(D): B POS
Antibody Screen: NEGATIVE

## 2022-03-14 LAB — PROTIME-INR
INR: 1.1 (ref 0.8–1.2)
Prothrombin Time: 14.1 seconds (ref 11.4–15.2)

## 2022-03-14 LAB — GLUCOSE, CAPILLARY: Glucose-Capillary: 113 mg/dL — ABNORMAL HIGH (ref 70–99)

## 2022-03-14 SURGERY — AMPUTATION, ABOVE KNEE
Anesthesia: General | Site: Knee | Laterality: Left

## 2022-03-14 MED ORDER — CHLORHEXIDINE GLUCONATE CLOTH 2 % EX PADS: 6.0000 | MEDICATED_PAD | Freq: Once | CUTANEOUS | Status: DC

## 2022-03-14 MED ORDER — CHLORHEXIDINE GLUCONATE 0.12 % MT SOLN
15.0000 mL | Freq: Once | OROMUCOSAL | Status: AC
  Administered 2022-03-14: 15 mL via OROMUCOSAL
  Filled 2022-03-14: qty 15

## 2022-03-14 MED ORDER — APIXABAN 5 MG PO TABS
5.0000 mg | ORAL_TABLET | Freq: Two times a day (BID) | ORAL | Status: DC
  Administered 2022-03-15 – 2022-03-17 (×5): 5 mg via ORAL
  Filled 2022-03-14 (×5): qty 1

## 2022-03-14 MED ORDER — SENNOSIDES-DOCUSATE SODIUM 8.6-50 MG PO TABS: 1.0000 | ORAL_TABLET | Freq: Every evening | ORAL | Status: DC | PRN

## 2022-03-14 MED ORDER — HYDROMORPHONE HCL 1 MG/ML IJ SOLN
0.2500 mg | INTRAMUSCULAR | Status: DC | PRN
  Administered 2022-03-14: 0.25 mg via INTRAVENOUS
  Administered 2022-03-14 (×3): 0.5 mg via INTRAVENOUS
  Administered 2022-03-14: 0.25 mg via INTRAVENOUS

## 2022-03-14 MED ORDER — DEXAMETHASONE SODIUM PHOSPHATE 10 MG/ML IJ SOLN
INTRAMUSCULAR | Status: DC | PRN
  Administered 2022-03-14: 5 mg via INTRAVENOUS

## 2022-03-14 MED ORDER — POTASSIUM CHLORIDE CRYS ER 20 MEQ PO TBCR: 20.0000 meq | EXTENDED_RELEASE_TABLET | Freq: Every day | ORAL | Status: DC | PRN

## 2022-03-14 MED ORDER — ORAL CARE MOUTH RINSE: 15.0000 mL | Freq: Once | OROMUCOSAL | Status: AC

## 2022-03-14 MED ORDER — FENTANYL CITRATE (PF) 250 MCG/5ML IJ SOLN
INTRAMUSCULAR | Status: DC | PRN
  Administered 2022-03-14 (×3): 25 ug via INTRAVENOUS

## 2022-03-14 MED ORDER — HEPARIN SODIUM (PORCINE) 5000 UNIT/ML IJ SOLN: 5000.0000 [IU] | Freq: Three times a day (TID) | INTRAMUSCULAR | Status: DC

## 2022-03-14 MED ORDER — LIDOCAINE 2% (20 MG/ML) 5 ML SYRINGE
INTRAMUSCULAR | Status: AC
  Filled 2022-03-14: qty 5

## 2022-03-14 MED ORDER — HYDROCODONE-ACETAMINOPHEN 5-325 MG PO TABS
1.0000 | ORAL_TABLET | ORAL | Status: DC | PRN
  Administered 2022-03-14 – 2022-03-17 (×10): 2 via ORAL
  Filled 2022-03-14 (×10): qty 2

## 2022-03-14 MED ORDER — PHENOL 1.4 % MT LIQD
1.0000 | OROMUCOSAL | Status: DC | PRN
Start: 2022-03-14 — End: 2022-03-17

## 2022-03-14 MED ORDER — PROPOFOL 10 MG/ML IV BOLUS
INTRAVENOUS | Status: DC | PRN
  Administered 2022-03-14: 70 mg via INTRAVENOUS

## 2022-03-14 MED ORDER — ONDANSETRON HCL 4 MG/2ML IJ SOLN: 4.0000 mg | Freq: Four times a day (QID) | INTRAMUSCULAR | Status: DC | PRN

## 2022-03-14 MED ORDER — DEXMEDETOMIDINE (PRECEDEX) IN NS 20 MCG/5ML (4 MCG/ML) IV SYRINGE
PREFILLED_SYRINGE | INTRAVENOUS | Status: DC | PRN
  Administered 2022-03-14: 8 ug via INTRAVENOUS

## 2022-03-14 MED ORDER — METOPROLOL TARTRATE 12.5 MG HALF TABLET
12.5000 mg | ORAL_TABLET | Freq: Two times a day (BID) | ORAL | Status: DC
  Administered 2022-03-14 – 2022-03-17 (×6): 12.5 mg via ORAL
  Filled 2022-03-14 (×7): qty 1

## 2022-03-14 MED ORDER — HYDRALAZINE HCL 20 MG/ML IJ SOLN: 5.0000 mg | INTRAMUSCULAR | Status: DC | PRN

## 2022-03-14 MED ORDER — ALUM & MAG HYDROXIDE-SIMETH 200-200-20 MG/5ML PO SUSP: 15.0000 mL | ORAL | Status: DC | PRN

## 2022-03-14 MED ORDER — ACETAMINOPHEN 10 MG/ML IV SOLN
INTRAVENOUS | Status: AC
  Filled 2022-03-14: qty 100

## 2022-03-14 MED ORDER — METOPROLOL TARTRATE 5 MG/5ML IV SOLN: 2.0000 mg | INTRAVENOUS | Status: DC | PRN

## 2022-03-14 MED ORDER — DEXMEDETOMIDINE HCL IN NACL 80 MCG/20ML IV SOLN
INTRAVENOUS | Status: AC
  Filled 2022-03-14: qty 20

## 2022-03-14 MED ORDER — ACETAMINOPHEN 10 MG/ML IV SOLN
1000.0000 mg | Freq: Once | INTRAVENOUS | Status: DC | PRN
Start: 2022-03-14 — End: 2022-03-14

## 2022-03-14 MED ORDER — CEFAZOLIN SODIUM-DEXTROSE 2-4 GM/100ML-% IV SOLN
2.0000 g | INTRAVENOUS | Status: AC
  Administered 2022-03-14: 2 g via INTRAVENOUS
  Filled 2022-03-14: qty 100

## 2022-03-14 MED ORDER — ONDANSETRON HCL 4 MG/2ML IJ SOLN: 4.0000 mg | Freq: Once | INTRAMUSCULAR | Status: DC | PRN

## 2022-03-14 MED ORDER — METOPROLOL TARTRATE 12.5 MG HALF TABLET
ORAL_TABLET | ORAL | Status: AC
  Filled 2022-03-14: qty 1

## 2022-03-14 MED ORDER — SODIUM CHLORIDE 0.9 % IV SOLN: INTRAVENOUS | Status: DC

## 2022-03-14 MED ORDER — FENTANYL CITRATE (PF) 250 MCG/5ML IJ SOLN
INTRAMUSCULAR | Status: AC
  Filled 2022-03-14: qty 5

## 2022-03-14 MED ORDER — MAGNESIUM SULFATE 2 GM/50ML IV SOLN: 2.0000 g | Freq: Every day | INTRAVENOUS | Status: DC | PRN

## 2022-03-14 MED ORDER — OXYCODONE HCL 5 MG PO TABS
5.0000 mg | ORAL_TABLET | Freq: Once | ORAL | Status: AC | PRN
  Administered 2022-03-14: 5 mg via ORAL

## 2022-03-14 MED ORDER — BISACODYL 5 MG PO TBEC: 5.0000 mg | DELAYED_RELEASE_TABLET | Freq: Every day | ORAL | Status: DC | PRN

## 2022-03-14 MED ORDER — NITROGLYCERIN 0.4 MG SL SUBL
0.4000 mg | SUBLINGUAL_TABLET | SUBLINGUAL | Status: DC | PRN
Start: 2022-03-14 — End: 2022-03-17

## 2022-03-14 MED ORDER — GUAIFENESIN-DM 100-10 MG/5ML PO SYRP: 15.0000 mL | ORAL_SOLUTION | ORAL | Status: DC | PRN

## 2022-03-14 MED ORDER — DOCUSATE SODIUM 100 MG PO CAPS
100.0000 mg | ORAL_CAPSULE | Freq: Every day | ORAL | Status: DC
Start: 2022-03-15 — End: 2022-03-17
  Administered 2022-03-15 – 2022-03-17 (×3): 100 mg via ORAL
  Filled 2022-03-14 (×3): qty 1

## 2022-03-14 MED ORDER — ROSUVASTATIN CALCIUM 20 MG PO TABS
40.0000 mg | ORAL_TABLET | Freq: Every day | ORAL | Status: DC
Start: 2022-03-14 — End: 2022-03-17
  Administered 2022-03-15 – 2022-03-16 (×2): 40 mg via ORAL
  Filled 2022-03-14 (×3): qty 2

## 2022-03-14 MED ORDER — HYDROMORPHONE HCL 1 MG/ML IJ SOLN
INTRAMUSCULAR | Status: AC
  Filled 2022-03-14: qty 1

## 2022-03-14 MED ORDER — PHENYLEPHRINE HCL-NACL 20-0.9 MG/250ML-% IV SOLN
INTRAVENOUS | Status: DC | PRN
  Administered 2022-03-14: 25 ug/min via INTRAVENOUS

## 2022-03-14 MED ORDER — AMISULPRIDE (ANTIEMETIC) 5 MG/2ML IV SOLN
10.0000 mg | Freq: Once | INTRAVENOUS | Status: AC | PRN
  Administered 2022-03-14: 10 mg via INTRAVENOUS

## 2022-03-14 MED ORDER — LABETALOL HCL 5 MG/ML IV SOLN: 10.0000 mg | INTRAVENOUS | Status: DC | PRN

## 2022-03-14 MED ORDER — CLOPIDOGREL BISULFATE 75 MG PO TABS
75.0000 mg | ORAL_TABLET | Freq: Every day | ORAL | Status: DC
  Administered 2022-03-15 – 2022-03-17 (×3): 75 mg via ORAL
  Filled 2022-03-14 (×3): qty 1

## 2022-03-14 MED ORDER — ACETAMINOPHEN 500 MG PO TABS: 500.0000 mg | ORAL_TABLET | Freq: Three times a day (TID) | ORAL | Status: DC | PRN

## 2022-03-14 MED ORDER — LIDOCAINE 2% (20 MG/ML) 5 ML SYRINGE
INTRAMUSCULAR | Status: DC | PRN
  Administered 2022-03-14: 60 mg via INTRAVENOUS

## 2022-03-14 MED ORDER — PANTOPRAZOLE SODIUM 40 MG PO TBEC
40.0000 mg | DELAYED_RELEASE_TABLET | Freq: Every day | ORAL | Status: DC
  Administered 2022-03-14 – 2022-03-17 (×4): 40 mg via ORAL
  Filled 2022-03-14 (×4): qty 1

## 2022-03-14 MED ORDER — 0.9 % SODIUM CHLORIDE (POUR BTL) OPTIME
TOPICAL | Status: DC | PRN
  Administered 2022-03-14: 1000 mL

## 2022-03-14 MED ORDER — GABAPENTIN 100 MG PO CAPS
100.0000 mg | ORAL_CAPSULE | Freq: Three times a day (TID) | ORAL | Status: DC
  Administered 2022-03-14 – 2022-03-17 (×8): 100 mg via ORAL
  Filled 2022-03-14 (×9): qty 1

## 2022-03-14 MED ORDER — ACETAMINOPHEN 10 MG/ML IV SOLN
INTRAVENOUS | Status: DC | PRN
  Administered 2022-03-14: 1000 mg via INTRAVENOUS

## 2022-03-14 MED ORDER — ACETAMINOPHEN 500 MG PO TABS
500.0000 mg | ORAL_TABLET | Freq: Four times a day (QID) | ORAL | Status: AC
  Administered 2022-03-14 – 2022-03-15 (×2): 500 mg via ORAL
  Filled 2022-03-14 (×2): qty 1

## 2022-03-14 MED ORDER — ONDANSETRON HCL 4 MG/2ML IJ SOLN
INTRAMUSCULAR | Status: AC
  Filled 2022-03-14: qty 2

## 2022-03-14 MED ORDER — ONDANSETRON HCL 4 MG/2ML IJ SOLN
INTRAMUSCULAR | Status: DC | PRN
  Administered 2022-03-14: 4 mg via INTRAVENOUS

## 2022-03-14 MED ORDER — OXYCODONE HCL 5 MG/5ML PO SOLN: 5.0000 mg | Freq: Once | ORAL | Status: AC | PRN

## 2022-03-14 MED ORDER — MORPHINE SULFATE (PF) 2 MG/ML IV SOLN
0.5000 mg | INTRAVENOUS | Status: DC | PRN
  Administered 2022-03-16: 1 mg via INTRAVENOUS
  Filled 2022-03-14: qty 1

## 2022-03-14 MED ORDER — MIDAZOLAM HCL 2 MG/2ML IJ SOLN
INTRAMUSCULAR | Status: AC
  Filled 2022-03-14: qty 2

## 2022-03-14 MED ORDER — OXYCODONE HCL 5 MG PO TABS
ORAL_TABLET | ORAL | Status: AC
  Filled 2022-03-14: qty 1

## 2022-03-14 MED ORDER — METOPROLOL TARTRATE 12.5 MG HALF TABLET
12.5000 mg | ORAL_TABLET | Freq: Once | ORAL | Status: AC
Start: 2022-03-14 — End: 2022-03-14
  Administered 2022-03-14: 12.5 mg via ORAL

## 2022-03-14 MED ORDER — AMISULPRIDE (ANTIEMETIC) 5 MG/2ML IV SOLN
INTRAVENOUS | Status: AC
  Filled 2022-03-14: qty 4

## 2022-03-14 SURGICAL SUPPLY — 59 items
BAG COUNTER SPONGE SURGICOUNT (BAG) ×2 IMPLANT
BANDAGE ESMARK 6X9 LF (GAUZE/BANDAGES/DRESSINGS) ×1 IMPLANT
BLADE SAGITTAL (BLADE) ×2
BLADE SAGITTAL 25.0X1.19X90 (BLADE) IMPLANT
BLADE SAW GIGLI 510 (BLADE) ×2 IMPLANT
BLADE SAW THK.89X75X18XSGTL (BLADE) IMPLANT
BNDG COHESIVE 6X5 TAN STRL LF (GAUZE/BANDAGES/DRESSINGS) ×2 IMPLANT
BNDG ELASTIC 4X5.8 VLCR STR LF (GAUZE/BANDAGES/DRESSINGS) ×2 IMPLANT
BNDG ELASTIC 6X5.8 VLCR STR LF (GAUZE/BANDAGES/DRESSINGS) ×2 IMPLANT
BNDG ESMARK 6X9 LF (GAUZE/BANDAGES/DRESSINGS) ×2
BNDG GAUZE DERMACEA FLUFF (GAUZE/BANDAGES/DRESSINGS) ×1
BNDG GAUZE DERMACEA FLUFF 4 (GAUZE/BANDAGES/DRESSINGS) IMPLANT
BNDG GAUZE ELAST 4 BULKY (GAUZE/BANDAGES/DRESSINGS) ×2 IMPLANT
CANISTER SUCT 3000ML PPV (MISCELLANEOUS) ×2 IMPLANT
CLIP LIGATING EXTRA MED SLVR (CLIP) ×2 IMPLANT
CLIP LIGATING EXTRA SM BLUE (MISCELLANEOUS) ×2 IMPLANT
COVER SURGICAL LIGHT HANDLE (MISCELLANEOUS) ×2 IMPLANT
CUFF TOURN SGL QUICK 24 (TOURNIQUET CUFF)
CUFF TOURN SGL QUICK 34 (TOURNIQUET CUFF)
CUFF TRNQT CYL 24X4X16.5-23 (TOURNIQUET CUFF) IMPLANT
CUFF TRNQT CYL 34X4.125X (TOURNIQUET CUFF) IMPLANT
DRAIN CHANNEL 19F RND (DRAIN) IMPLANT
DRAPE DERMATAC (DRAPES) IMPLANT
DRAPE HALF SHEET 40X57 (DRAPES) ×2 IMPLANT
DRAPE INCISE IOBAN 66X45 STRL (DRAPES) IMPLANT
DRAPE ORTHO SPLIT 77X108 STRL (DRAPES) ×4
DRAPE SURG ORHT 6 SPLT 77X108 (DRAPES) ×2 IMPLANT
DRESSING PREVENA PLUS CUSTOM (GAUZE/BANDAGES/DRESSINGS) IMPLANT
DRSG ADAPTIC 3X8 NADH LF (GAUZE/BANDAGES/DRESSINGS) ×2 IMPLANT
DRSG PREVENA PLUS CUSTOM (GAUZE/BANDAGES/DRESSINGS)
ELECT CAUTERY BLADE 6.4 (BLADE) ×2 IMPLANT
ELECT REM PT RETURN 9FT ADLT (ELECTROSURGICAL) ×2
ELECTRODE REM PT RTRN 9FT ADLT (ELECTROSURGICAL) ×1 IMPLANT
EVACUATOR SILICONE 100CC (DRAIN) IMPLANT
GAUZE SPONGE 4X4 12PLY STRL (GAUZE/BANDAGES/DRESSINGS) ×2 IMPLANT
GAUZE XEROFORM 5X9 LF (GAUZE/BANDAGES/DRESSINGS) ×1 IMPLANT
GLOVE BIO SURGEON STRL SZ7.5 (GLOVE) ×2 IMPLANT
GOWN STRL REUS W/ TWL LRG LVL3 (GOWN DISPOSABLE) ×2 IMPLANT
GOWN STRL REUS W/ TWL XL LVL3 (GOWN DISPOSABLE) ×1 IMPLANT
GOWN STRL REUS W/TWL LRG LVL3 (GOWN DISPOSABLE) ×4
GOWN STRL REUS W/TWL XL LVL3 (GOWN DISPOSABLE) ×2
KIT BASIN OR (CUSTOM PROCEDURE TRAY) ×2 IMPLANT
KIT TURNOVER KIT B (KITS) ×2 IMPLANT
NS IRRIG 1000ML POUR BTL (IV SOLUTION) ×2 IMPLANT
PACK GENERAL/GYN (CUSTOM PROCEDURE TRAY) ×2 IMPLANT
PAD ARMBOARD 7.5X6 YLW CONV (MISCELLANEOUS) ×4 IMPLANT
PREVENA RESTOR ARTHOFORM 46X30 (CANNISTER) IMPLANT
STAPLER VISISTAT 35W (STAPLE) ×3 IMPLANT
STOCKINETTE IMPERVIOUS LG (DRAPES) ×2 IMPLANT
SUT ETHILON 3 0 PS 1 (SUTURE) IMPLANT
SUT SILK 0 TIES 10X30 (SUTURE) ×2 IMPLANT
SUT SILK 2 0 (SUTURE) ×2
SUT SILK 2-0 18XBRD TIE 12 (SUTURE) ×1 IMPLANT
SUT SILK 3 0 (SUTURE)
SUT SILK 3-0 18XBRD TIE 12 (SUTURE) IMPLANT
SUT VIC AB 2-0 CT1 18 (SUTURE) ×4 IMPLANT
TOWEL GREEN STERILE (TOWEL DISPOSABLE) ×4 IMPLANT
UNDERPAD 30X36 HEAVY ABSORB (UNDERPADS AND DIAPERS) ×2 IMPLANT
WATER STERILE IRR 1000ML POUR (IV SOLUTION) ×2 IMPLANT

## 2022-03-14 NOTE — Progress Notes (Signed)
Unable to collect urine for UA. Dr. Randie Heinz made aware at bedside.

## 2022-03-14 NOTE — Progress Notes (Signed)
Pt arrived from .[facility]..., A/ox .3.Marland Kitchenpt denies any pain, MD aware,CCMD called. CHG bath given,no further needs at this time

## 2022-03-14 NOTE — Interval H&P Note (Signed)
History and Physical Interval Note:  03/14/2022 9:20 AM  Mercedes [name]  has presented today for surgery, with the diagnosis of CRITICAL LIMB ISCHEMIA.  The various methods of treatment have been discussed with the patient and family. After consideration of risks, benefits and other options for treatment, the patient has consented to  Procedure(s): REVISION LEFT ABOVE KNEE AMPUTATION (Left) as a surgical intervention.  The patient's history has been reviewed, patient examined, no change in status, stable for surgery.  I have reviewed the patient's chart and labs.  Questions were answered to the patient's satisfaction.     Lemar Livings

## 2022-03-14 NOTE — Progress Notes (Signed)
Inpatient Rehabilitation Admissions Coordinator   Per postop order for rehab consult, patient was screened for CIR candidacy by Ottie Glazier RN MSN. Current payor trends with [facility] Medicare are unlikley to approve a CIR/AIR level rehab for this diagnosis. I will not place a Rehab Conuslt at this time. Recommend other rehab venues to be pursued.  Ottie Glazier, RN, MSN Rehab Admissions Coordinator [phone] 03/14/2022 12:47 PM

## 2022-03-14 NOTE — Anesthesia Postprocedure Evaluation (Signed)
Anesthesia Post Note  Patient: Mercedes [name]  Procedure(s) Performed: REVISION LEFT ABOVE KNEE AMPUTATION (Left: Knee)     Patient location during evaluation: PACU Anesthesia Type: General Level of consciousness: awake and alert Pain management: pain level controlled Vital Signs Assessment: post-procedure vital signs reviewed and stable Respiratory status: spontaneous breathing, nonlabored ventilation, respiratory function stable and patient connected to nasal cannula oxygen Cardiovascular status: blood pressure returned to baseline and stable Postop Assessment: no apparent nausea or vomiting Anesthetic complications: no   No notable events documented.  Last Vitals:  Vitals:   03/14/22 1345 03/14/22 1400  BP:  136/72  Pulse:    Resp:  12  Temp:    SpO2: 98% 98%    Last Pain:  Vitals:   03/14/22 1246  TempSrc:   PainSc: 0-No pain                 Trevor Iha

## 2022-03-14 NOTE — Op Note (Signed)
    Patient name: Mercedes [name] MRN: [id number] DOB: 1956-04-27 Sex: female  03/14/2022 Pre-operative Diagnosis: Nonhealing left above-knee amputation Post-operative diagnosis:  Same Surgeon:  Apolinar Junes C. Randie Heinz, MD Assistant: Clinton Gallant, PA Procedure Performed: Revision of left above-knee amputation with conversion to mid femoral amputation.  Indications: 66 year old female with bilateral above-knee amputations now with bone exposed through a small wound of her left side of the amputation site.  She is now indicated for revision with conversion to a higher amputation.   Procedure:  The patient was identified in the holding area and taken to the operating where she is placed supine on the operative table general anesthesia was induced.  She was sterilely prepped and draped in the previous left above-knee amputation site in the usual fashion, antibiotics were administered timeout was called.  We attempted a fishmouth incision was made around the previous incision site and wound with exposed bone.  We dissected down to the level of the bone.  A total of approximately 4 inches of the bone was dissected back.  The bone was transected with a power saw.  All the soft tissue was removed.  The neurovascular bundle was not encountered.  We thoroughly irrigated the wound obtaining stasis and smooth the bone with a rasp.  We reapproximated the fascia with interrupted 2-0 Vicryl suture and the skin was closed with staples.  Sterile dressing was applied.  She was awakened from anesthesia having tolerated procedure without immediate complication.  All counts were correct at completion.   Given the complexity of the case,  the assistant was necessary in order to expedite the procedure and safely perform the technical aspects of the operation.  Specifically the assistant held with traction and soft tissue for transection of bone and closure of the wound.   EBL: 50cc  [name] C. Randie Heinz, MD [facility] [place] Office: [phone] Pager: [phone]

## 2022-03-14 NOTE — Anesthesia Procedure Notes (Signed)
Procedure Name: LMA Insertion Date/Time: 03/14/2022 10:25 AM  Performed by: Macie Burows, CRNAPre-anesthesia Checklist: Patient identified, Emergency Drugs available, Timeout performed, Suction available and Patient being monitored Patient Re-evaluated:Patient Re-evaluated prior to induction Oxygen Delivery Method: Circle system utilized Preoxygenation: Pre-oxygenation with 100% oxygen Induction Type: IV induction Ventilation: Mask ventilation without difficulty LMA: LMA inserted LMA Size: 4.0 Number of attempts: 1 Dental Injury: Teeth and Oropharynx as per pre-operative assessment

## 2022-03-14 NOTE — Transfer of Care (Addendum)
Immediate Anesthesia Transfer of Care Note  Patient: Mercedes [name]  Procedure(s) Performed: REVISION LEFT ABOVE KNEE AMPUTATION (Left: Knee)  Patient Location: PACU  Anesthesia Type:General  Level of Consciousness: awake, alert  and oriented  Airway & Oxygen Therapy: Patient Spontanous Breathing and Patient connected to face mask oxygen  Post-op Assessment: Report given to RN and Post -op Vital signs reviewed and stable  Post vital signs: Reviewed and stable  Last Vitals:  Vitals Value Taken Time  BP 148/68 03/14/22 1109  Temp    Pulse 58 03/14/22 1113  Resp 10 03/14/22 1113  SpO2 95 % 03/14/22 1113  Vitals shown include unvalidated device data.  Last Pain:  Vitals:   03/14/22 0758  TempSrc:   PainSc: 6          Complications: No notable events documented.

## 2022-03-15 ENCOUNTER — Encounter (HOSPITAL_COMMUNITY): Payer: Self-pay | Admitting: Vascular Surgery

## 2022-03-15 LAB — LIPID PANEL
Cholesterol: 139 mg/dL (ref 0–200)
HDL: 50 mg/dL (ref >40)
LDL Cholesterol: 74 mg/dL (ref 0–99)
Total CHOL/HDL Ratio: 2.8 RATIO
Triglycerides: 76 mg/dL (ref <150)
VLDL: 15 mg/dL (ref 0–40)

## 2022-03-15 LAB — BASIC METABOLIC PANEL
Anion gap: 3 — ABNORMAL LOW (ref 5–15)
BUN: 10 mg/dL (ref 8–23)
CO2: 23 mmol/L (ref 22–32)
Calcium: 8.9 mg/dL (ref 8.9–10.3)
Chloride: 108 mmol/L (ref 98–111)
Creatinine, Ser: 0.75 mg/dL (ref 0.44–1.00)
GFR, Estimated: >60 mL/min (ref >60)
Glucose, Bld: 91 mg/dL (ref 70–99)
Potassium: 4.6 mmol/L (ref 3.5–5.1)
Sodium: 134 mmol/L — ABNORMAL LOW (ref 135–145)

## 2022-03-15 LAB — CBC
HCT: 36.3 % (ref 36.0–46.0)
Hemoglobin: 11.4 g/dL — ABNORMAL LOW (ref 12.0–15.0)
MCH: 24.4 pg — ABNORMAL LOW (ref 26.0–34.0)
MCHC: 31.4 g/dL (ref 30.0–36.0)
MCV: 77.7 fL — ABNORMAL LOW (ref 80.0–100.0)
Platelets: 320 10*3/uL (ref 150–400)
RBC: 4.67 MIL/uL (ref 3.87–5.11)
RDW: 16.8 % — ABNORMAL HIGH (ref 11.5–15.5)
WBC: 6.8 10*3/uL (ref 4.0–10.5)
nRBC: 0 % (ref 0.0–0.2)

## 2022-03-15 MED ORDER — PNEUMOCOCCAL 20-VAL CONJ VACC 0.5 ML IM SUSY
0.5000 mL | PREFILLED_SYRINGE | INTRAMUSCULAR | Status: DC
  Filled 2022-03-15: qty 0.5

## 2022-03-15 MED ORDER — EZETIMIBE 10 MG PO TABS
10.0000 mg | ORAL_TABLET | Freq: Every day | ORAL | Status: DC
  Administered 2022-03-15 – 2022-03-17 (×3): 10 mg via ORAL
  Filled 2022-03-15 (×3): qty 1

## 2022-03-15 NOTE — Evaluation (Signed)
Physical Therapy Evaluation Patient Details Name: Mercedes [name] MRN: [id number] DOB: 12-25-1955 Today's Date: 03/15/2022  History of Present Illness  66 y.o. female history of bilateral above-knee amputations fitted for prosthetics, s/p Revision of L AKA.  PMH: RA, PAD, MI, HTN, CAD, hs of CVA.  Clinical Impression  Patient presents with decreased mobility due to deficits listed in PT problem list.  Patient has been through rehab multiple times and reports family planning to stay with her while recuperating and plans on HHPT.    PT will continue to follow acutely.  Made it up to EOB with min A today and likely could have transferred to chair with min to mod A but too painful and meds given only during session.  Will follow up and plan to pre-medicate.      Recommendations for follow up therapy are one component of a multi-disciplinary discharge planning process, led by the attending physician.  Recommendations may be updated based on patient status, additional functional criteria and insurance authorization.  Follow Up Recommendations Home health PT      Assistance Recommended at Discharge Frequent or constant Supervision/Assistance  Patient can return home with the following  Assistance with cooking/housework;Direct supervision/assist for medications management;Assist for transportation    Equipment Recommendations None recommended by PT  Recommendations for Other Services       Functional Status Assessment Patient has had a recent decline in their functional status and demonstrates the ability to make significant improvements in function in a reasonable and predictable amount of time.     Precautions / Restrictions Precautions Precautions: Fall Restrictions Weight Bearing Restrictions: Yes RLE Weight Bearing: Non weight bearing LLE Weight Bearing: Non weight bearing      Mobility  Bed Mobility Overal bed mobility: Needs Assistance Bed Mobility: Rolling, Supine to Sit,  Sit to Supine Rolling: Supervision   Supine to sit: HOB elevated, Min assist Sit to supine: Min assist   General bed mobility comments: rolled to remove towel under her, painful and guarding L LE; to sit EOB min A using bed pad under her to scoot L hip.  Patient to supine with very little help to scoot back the L hip    Transfers                   General transfer comment: deferred due to pain L LE    Ambulation/Gait                  Stairs            Wheelchair Mobility    Modified Rankin (Stroke Patients Only)       Balance Overall balance assessment: Needs assistance Sitting-balance support: Single extremity supported, No upper extremity supported Sitting balance-Leahy Scale: Fair Sitting balance - Comments: EOB without UE support at times, moves well within her BOS and some leaning posterior but recovers holding the leg back to upright                                     Pertinent Vitals/Pain Pain Assessment Pain Assessment: 0-10 Pain Score: 7  Pain Location: L leg Pain Descriptors / Indicators: Sore, Guarding Pain Intervention(s): Monitored during session, Repositioned, RN gave pain meds during session    Home Living Family/patient expects to be discharged to:: Private residence Living Arrangements: Alone Available Help at Discharge: Family;Available PRN/intermittently;Personal care attendant (aide 7 d/wk 2-3 hours a  day) Type of Home: House Home Access: Ramped entrance       Home Layout: One level Home Equipment: Wheelchair - manual;Tub bench;Wheelchair - power;BSC/3in1      Prior Function Prior Level of Function : Independent/Modified Independent             Mobility Comments: Reports independence with transfers to/from [facility] with prosthetic ADLs Comments: Reports independence with transfer to tub bench and toilet, uses manual w/c in the home, and the scooter outside.  Family assists with any community mobility.      Hand Dominance   Dominant Hand: Right    Extremity/Trunk Assessment   Upper Extremity Assessment Upper Extremity Assessment: Overall WFL for tasks assessed    Lower Extremity Assessment Lower Extremity Assessment: LLE deficits/detail;RLE deficits/detail RLE Deficits / Details: AROM WFL, noted that has limited padding at tip of bone of residual limb, pt reports MD concerned may need revision as the L needed. LLE Deficits / Details: painful with lifting the leg, can move laterally unaided, strength not formally tested due to pain    Cervical / Trunk Assessment Cervical / Trunk Assessment: Normal  Communication   Communication: No difficulties  Cognition Arousal/Alertness: Awake/alert Behavior During Therapy: WFL for tasks assessed/performed Overall Cognitive Status: Within Functional Limits for tasks assessed                                          General Comments General comments (skin integrity, edema, etc.): ace wrap over L LE    Exercises Amputee Exercises Hip Extension: AROM, Both, Supine Hip ABduction/ADduction: AROM, Left, 5 reps   Assessment/Plan    PT Assessment Patient needs continued PT services  PT Problem List Decreased mobility;Pain;Decreased activity tolerance;Decreased range of motion;Decreased knowledge of use of DME       PT Treatment Interventions DME instruction;Therapeutic activities;Therapeutic exercise;Patient/family education;Functional mobility training    PT Goals (Current goals can be found in the Care Plan section)  Acute Rehab PT Goals Patient Stated Goal: to go home PT Goal Formulation: With patient Time For Goal Achievement: 03/29/22 Potential to Achieve Goals: Good    Frequency Min 3X/week     Co-evaluation               AM-PAC PT "6 Clicks" Mobility  Outcome Measure Help needed turning from your back to your side while in a flat bed without using bedrails?: A Little Help needed moving from lying on  your back to sitting on the side of a flat bed without using bedrails?: A Little Help needed moving to and from a bed to a chair (including a wheelchair)?: Total Help needed standing up from a chair using your arms (e.g., wheelchair or bedside chair)?: Total Help needed to walk in hospital room?: Total Help needed climbing 3-5 steps with a railing? : Total 6 Click Score: 10    End of Session   Activity Tolerance: Patient limited by pain Patient left: in bed;with call bell/phone within reach Nurse Communication: Mobility status PT Visit Diagnosis: Other abnormalities of gait and mobility (R26.89)    Time: 1045-1110 PT Time Calculation (min) (ACUTE ONLY): 25 min   Charges:   PT Evaluation $PT Eval Moderate Complexity: 1 Mod PT Treatments $Therapeutic Activity: 8-22 mins        Sheran Lawless, PT Acute Rehabilitation Services [phone] 03/15/2022   Mercedes [name] 03/15/2022, 11:41 AM

## 2022-03-15 NOTE — Progress Notes (Addendum)
  Progress Note    03/15/2022 7:51 AM 1 Day Post-Op  Subjective:  looks good this morning.  Says her leg is a little sore.    Afebrile HR 50's 110's-150's systolic 100% RA  Vitals:   03/15/22 0015 03/15/22 0457  BP: (!) 118/58 (!) 152/71  Pulse: (!) 51 62  Resp: 15 12  Temp: 97.7 F (36.5 C) 97.8 F (36.6 C)  SpO2: 100% 100%    Physical Exam: Incisions:  bandage in place and is clean and dry.   CBC    Component Value Date/Time   WBC 6.8 03/15/2022 0241   RBC 4.67 03/15/2022 0241   HGB 11.4 (L) 03/15/2022 0241   HGB 14.2 10/09/2018 1125   HCT 36.3 03/15/2022 0241   HCT 44.6 10/09/2018 1125   PLT 320 03/15/2022 0241   PLT 289 10/09/2018 1125   MCV 77.7 (L) 03/15/2022 0241   MCV 79 10/09/2018 1125   MCH 24.4 (L) 03/15/2022 0241   MCHC 31.4 03/15/2022 0241   RDW 16.8 (H) 03/15/2022 0241   RDW 14.8 10/09/2018 1125   LYMPHSABS 2.1 11/22/2021 0415   MONOABS 0.4 11/22/2021 0415   EOSABS 0.2 11/22/2021 0415   BASOSABS 0.1 11/22/2021 0415    BMET    Component Value Date/Time   NA 134 (L) 03/15/2022 0241   NA 135 10/09/2018 1125   K 4.6 03/15/2022 0241   CL 108 03/15/2022 0241   CO2 23 03/15/2022 0241   GLUCOSE 91 03/15/2022 0241   BUN 10 03/15/2022 0241   BUN 10 10/09/2018 1125   CREATININE 0.75 03/15/2022 0241   CREATININE 1.13 (H) 08/03/2016 1440   CALCIUM 8.9 03/15/2022 0241   GFRNONAA >60 03/15/2022 0241   GFRAA >60 11/24/2019 0620    INR    Component Value Date/Time   INR 1.1 03/14/2022 0836     Intake/Output Summary (Last 24 hours) at 03/15/2022 0751 Last data filed at 03/15/2022 0458 Gross per 24 hour  Intake 950.45 ml  Output 350 ml  Net 600.45 ml     Assessment/Plan:  66 y.o. female is s/p Revision of left above-knee amputation with conversion to mid femoral amputation.  1 Day Post-Op   -pt doing well this morning with pain controlled.  -dressing in place and is clean and dry.  Will take this down tomorrow.  -Eliquis to restart  this morning at 1000. -when ready for discharge, will most likely discharge back to home but will d/w Dr. Johny Shears, PA-C [facility] [phone] 03/15/2022 7:51 AM  I have independently interviewed and examined patient and agree with PA assessment and plan above. Plan for dressing down tomorrow and possibly home.   Leafy Motsinger C. Randie Heinz, MD [facility] [place] Office: [phone] Pager: [phone]

## 2022-03-15 NOTE — Evaluation (Signed)
Occupational Therapy Evaluation Patient Details Name: Mercedes [name] MRN: [id number] DOB: 06/16/1956 Today's Date: 03/15/2022   History of Present Illness 66 y.o. female history of bilateral above-knee amputations fitted for prosthetics, s/p Revision of L AKA.  PMH: RA, PAD, MI, HTN, CAD, hs of CVA.   Clinical Impression   Patient admitted for the procedure above.  Currently the assessment is limited due to the position of an IV in her Left wrist.  Patient is unable to bend her wrist in order to assist with transfers.  When OT discussed pushing through her fist and keeping the wrist straight, patient is not comfortable attempting.  RN contacted about patient request to remove, or change placement.  Patient plans on returning home with assist as needed.  OT will follow in the acute setting to assist with any discharge recommendations needed.        Recommendations for follow up therapy are one component of a multi-disciplinary discharge planning process, led by the attending physician.  Recommendations may be updated based on patient status, additional functional criteria and insurance authorization.   Follow Up Recommendations  Follow physician's recommendations for discharge plan and follow up therapies    Assistance Recommended at Discharge Intermittent Supervision/Assistance  Patient can return home with the following Assist for transportation;Help with stairs or ramp for entrance    Functional Status Assessment  Patient has had a recent decline in their functional status and demonstrates the ability to make significant improvements in function in a reasonable and predictable amount of time.  Equipment Recommendations  None recommended by OT    Recommendations for Other Services       Precautions / Restrictions Precautions Precautions: Fall Restrictions Weight Bearing Restrictions: Yes RLE Weight Bearing: Non weight bearing LLE Weight Bearing: Non weight bearing       Mobility Bed Mobility Overal bed mobility: Needs Assistance Bed Mobility: Rolling Rolling: Min guard         General bed mobility comments: half roll side to side with use of SR's    Transfers                   General transfer comment: declined      Balance                                           ADL either performed or assessed with clinical judgement   ADL   Eating/Feeding: Independent;Bed level   Grooming: Set up;Bed level               Lower Body Dressing: Minimal assistance;Moderate assistance;Bed level                       Vision Patient Visual Report: No change from baseline       Perception Perception Perception: Within Functional Limits   Praxis Praxis Praxis: Intact    Pertinent Vitals/Pain Pain Assessment Pain Assessment: Faces Faces Pain Scale: Hurts even more Pain Location: L leg and L wrist Pain Descriptors / Indicators: Sore, Guarding Pain Intervention(s): Monitored during session, Limited activity within patient's tolerance     Hand Dominance Right   Extremity/Trunk Assessment Upper Extremity Assessment Upper Extremity Assessment: Overall WFL for tasks assessed   Lower Extremity Assessment Lower Extremity Assessment: Defer to PT evaluation   Cervical / Trunk Assessment Cervical / Trunk Assessment: Normal   Communication  Communication Communication: No difficulties   Cognition Arousal/Alertness: Awake/alert Behavior During Therapy: WFL for tasks assessed/performed Overall Cognitive Status: Within Functional Limits for tasks assessed                                                        Home Living Family/patient expects to be discharged to:: Private residence   Available Help at Discharge: Family;Available PRN/intermittently Type of Home: House Home Access: Ramped entrance     Home Layout: One level     Bathroom Shower/Tub: Tub/shower unit;Sponge  bathes at baseline   Bathroom Toilet: Standard Bathroom Accessibility: Yes How Accessible: Accessible via wheelchair Home Equipment: Wheelchair - manual;Tub bench;Wheelchair - power;BSC/3in1          Prior Functioning/Environment               Mobility Comments: Reports independence with transfers to/from [facility] with prosthetic ADLs Comments: Reports independence with transfer to tub bench and toilet, uses manual w/c in the home, and the scooter outside.  Family assists with any community mobility.        OT Problem List: Decreased activity tolerance;Impaired balance (sitting and/or standing);Pain      OT Treatment/Interventions: Self-care/ADL training;Therapeutic activities;Balance training;Patient/family education    OT Goals(Current goals can be found in the care plan section) Acute Rehab OT Goals Patient Stated Goal: Return home OT Goal Formulation: With patient Time For Goal Achievement: 03/29/22 Potential to Achieve Goals: Good ADL Goals Pt Will Perform Grooming: with set-up;sitting Pt Will Perform Lower Body Dressing: bed level;with set-up Pt Will Transfer to Toilet: with min assist;with transfer board;bedside commode Pt/caregiver will Perform Home Exercise Program: Increased strength;Both right and left upper extremity;With theraband;With written HEP provided  OT Frequency: Min 2X/week    Co-evaluation              AM-PAC OT "6 Clicks" Daily Activity     Outcome Measure Help from another person eating meals?: None Help from another person taking care of personal grooming?: None Help from another person toileting, which includes using toliet, bedpan, or urinal?: A Lot Help from another person bathing (including washing, rinsing, drying)?: A Lot Help from another person to put on and taking off regular upper body clothing?: None Help from another person to put on and taking off regular lower body clothing?: A Lot 6 Click Score: 18   End of Session Nurse  Communication: Other (comment) (position of L IV)  Activity Tolerance: Patient limited by pain Patient left: in bed;with call bell/phone within reach  OT Visit Diagnosis: Pain Pain - Right/Left: Left Pain - part of body: Leg                Time: 0947-0962 OT Time Calculation (min): 16 min Charges:  OT General Charges $OT Visit: 1 Visit OT Evaluation $OT Eval Moderate Complexity: 1 Mod  03/15/2022  RP, OTR/L  Acute Rehabilitation Services  Office:  [phone]   Suzanna Obey 03/15/2022, 9:50 AM

## 2022-03-15 NOTE — Progress Notes (Signed)
PHARMACIST LIPID MONITORING   Mercedes [name] is a 66 y.o. female admitted on 03/14/2022 with PVD.  Pharmacy has been consulted to optimize lipid-lowering therapy with the indication of secondary prevention for clinical ASCVD.  Recent Labs:  Lipid Panel (last 6 months):   Lab Results  Component Value Date   CHOL 139 03/15/2022   TRIG 76 03/15/2022   HDL 50 03/15/2022   CHOLHDL 2.8 03/15/2022   VLDL 15 03/15/2022   LDLCALC 74 03/15/2022    Hepatic function panel (last 6 months):   Lab Results  Component Value Date   AST 24 03/14/2022   ALT 11 03/14/2022   ALKPHOS 79 03/14/2022   BILITOT 0.5 03/14/2022    SCr (since admission):   Serum creatinine: 0.75 mg/dL 89/38/10 1751 Estimated creatinine clearance: 45.3 mL/min  Current therapy and lipid therapy tolerance Current lipid-lowering therapy: Crestor 40mg /d Documented or reported allergies or intolerances to lipid-lowering therapies (if applicable): fatigue with zetia  Assessment:   Patient agrees with changes to lipid-lowering therapy. History of intolerance noted. She is willing to retry Zetia   Plan:    1.Statin intensity (high intensity recommended for all patients regardless of the LDL):  No statin changes. The patient is already on a high intensity statin.  2.Add ezetimibe (if any one of the following):   On a high intensity statin with LDL > 70.  3.Refer to lipid clinic:   Yes  4.Follow-up with:  Lipid clinic referral.  5.Follow-up labs after discharge:  Changes in lipid therapy were made. Check a lipid panel in 8-12 weeks then annually.     10-12, PharmD Clinical Pharmacist **Pharmacist phone directory can now be found on [email or web address] ([email or web address]).  Listed under [facility].

## 2022-03-16 LAB — BASIC METABOLIC PANEL
Anion gap: 6 (ref 5–15)
BUN: 16 mg/dL (ref 8–23)
CO2: 24 mmol/L (ref 22–32)
Calcium: 8.5 mg/dL — ABNORMAL LOW (ref 8.9–10.3)
Chloride: 107 mmol/L (ref 98–111)
Creatinine, Ser: 0.97 mg/dL (ref 0.44–1.00)
GFR, Estimated: >60 mL/min (ref >60)
Glucose, Bld: 123 mg/dL — ABNORMAL HIGH (ref 70–99)
Potassium: 4.1 mmol/L (ref 3.5–5.1)
Sodium: 137 mmol/L (ref 135–145)

## 2022-03-16 LAB — CBC
HCT: 34.3 % — ABNORMAL LOW (ref 36.0–46.0)
Hemoglobin: 10.8 g/dL — ABNORMAL LOW (ref 12.0–15.0)
MCH: 24.4 pg — ABNORMAL LOW (ref 26.0–34.0)
MCHC: 31.5 g/dL (ref 30.0–36.0)
MCV: 77.4 fL — ABNORMAL LOW (ref 80.0–100.0)
Platelets: 323 10*3/uL (ref 150–400)
RBC: 4.43 MIL/uL (ref 3.87–5.11)
RDW: 16.9 % — ABNORMAL HIGH (ref 11.5–15.5)
WBC: 5.7 10*3/uL (ref 4.0–10.5)
nRBC: 0 % (ref 0.0–0.2)

## 2022-03-16 NOTE — Progress Notes (Signed)
Physical Therapy Treatment Patient Details Name: Mercedes Mercedes [name] MRN: [id number] DOB: 02/21/1956 Today's Date: 03/16/2022   History of Present Illness 66 y.o. female history of bilateral above-knee amputations fitted for prosthetics, s/p Revision of L AKA.  PMH: Mercedes [name], PAD, MI, HTN, CAD, hs of CVA.    PT Comments    Pt pleasant and motivated for session and mobility, however pt limited by lightheadedness/ "floating" feeling secondary to premedication.  Pt able to come to sitting EOB with min assist to elevate trunk and gain balance at midline. Pt demonstrating good trunk strength/support with ability to lean posterior and return to upright as well as side<>middle each way repeatedly for increased trunk strength without UE support. Transfer training deferred secondary to pt request and to maintain pt safety secondary to c/o post medication. Pt with good tolerance for supine therex for increased ROM of LE, encouraged pt to continue exercises throughout day, with pt verbalizing importance. Pt continues to benefit from skilled PT services to progress toward functional mobility goals.    Recommendations for follow up therapy are one component of a multi-disciplinary discharge planning process, led by the attending physician.  Recommendations may be updated based on patient status, additional functional criteria and insurance authorization.  Follow Up Recommendations  Home health PT     Assistance Recommended at Discharge Frequent or constant Supervision/Assistance  Patient can return home with the following Assistance with cooking/housework;Direct supervision/assist for medications management;Assist for transportation   Equipment Recommendations  None recommended by PT    Recommendations for Other Services       Precautions / Restrictions Precautions Precautions: Fall Restrictions Weight Bearing Restrictions: Yes LLE Weight Bearing: Non weight bearing     Mobility  Bed Mobility Overal  bed mobility: Needs Assistance Bed Mobility: Supine to Sit, Sit to Supine Rolling: Supervision   Supine to sit: HOB elevated, Min assist Sit to supine: Min guard   General bed mobility comments: min assist to elevate trunk and gain balance at EOB, min guard to return to supine with increased time    Transfers                   General transfer comment: deferred secondary to pt request, pt stating she was feeling "floaty" from premedication and did not feel safe to trasnfer    Ambulation/Gait                   Stairs             Wheelchair Mobility    Modified Rankin (Stroke Patients Only)       Balance Overall balance assessment: Needs assistance Sitting-balance support: Single extremity supported, No upper extremity supported Sitting balance-Leahy Scale: Fair Sitting balance - Comments: EOB without UE support at times, moves well within her BOS and some leaning posterior but recovers holding the leg back to upright                                    Cognition Arousal/Alertness: Awake/alert Behavior During Therapy: WFL for tasks assessed/performed Overall Cognitive Status: Within Functional Limits for tasks assessed                                          Exercises Amputee Exercises Hip Extension: AROM, Both, 10 reps, Supine Hip ABduction/ADduction:  AROM, Left, 10 reps, Supine Other Exercises Other Exercises: modified sit ups with RLE and L hip blocked, pt able to lean posterior and return to upright sitting for trunk strength, x10, leaning side<>middle, x10 each way for trunk strength, pt with good control and demonstrating good core strength throughout    General Comments General comments (skin integrity, edema, etc.): ace wrap over L LE      Pertinent Vitals/Pain Pain Assessment Pain Assessment: Faces Faces Pain Scale: Hurts a little bit Pain Location: L leg Pain Descriptors / Indicators: Sore,  Guarding, Grimacing Pain Intervention(s): Premedicated before session, Monitored during session, Limited activity within patient's tolerance    Home Living                          Prior Function            PT Goals (current goals can now be found in the care plan section) Acute Rehab PT Goals Patient Stated Goal: to go home PT Goal Formulation: With patient Time For Goal Achievement: 03/29/22    Frequency    Min 3X/week      PT Plan      Co-evaluation              AM-PAC PT "6 Clicks" Mobility   Outcome Measure  Help needed turning from your back to your side while in a flat bed without using bedrails?: A Little Help needed moving from lying on your back to sitting on the side of a flat bed without using bedrails?: A Little Help needed moving to and from a bed to a chair (including a wheelchair)?: A Lot Help needed standing up from a chair using your arms (e.g., wheelchair or bedside chair)?: Total Help needed to walk in hospital room?: Total Help needed climbing 3-5 steps with a railing? : Total 6 Click Score: 11    End of Session   Activity Tolerance: Patient tolerated treatment well Patient left: in bed;with call bell/phone within reach Nurse Communication: Mobility status PT Visit Diagnosis: Other abnormalities of gait and mobility (R26.89)     Time: 9528-4132 PT Time Calculation (min) (ACUTE ONLY): 26 min  Charges:  $Therapeutic Exercise: 8-22 mins $Therapeutic Activity: 8-22 mins                     Emilygrace Grothe R. PTA Acute Rehabilitation Services Office: [phone]    Catalina Antigua 03/16/2022, 12:31 PM

## 2022-03-16 NOTE — Plan of Care (Signed)
  Problem: Clinical Measurements: Goal: Postoperative complications will be avoided or minimized Outcome: Progressing   Problem: Self-Care: Goal: Ability to meet self-care needs will improve Outcome: Progressing   Problem: Pain Management: Goal: Pain level will decrease with appropriate interventions Outcome: Progressing   Problem: Education: Goal: Knowledge of General Education information will improve Description: Including pain rating scale, medication(s)/side effects and non-pharmacologic comfort measures Outcome: Progressing

## 2022-03-16 NOTE — Progress Notes (Addendum)
  Progress Note    03/16/2022 7:34 AM 2 Days Post-Op  Subjective:  Patient is in significant amount of pain today. She states it comes in waves of sharp pain. She does not feel ready to go home today.    Vitals:   03/15/22 2355 03/16/22 0325  BP: 131/68 118/66  Pulse: 63 (!) 54  Resp: 18 15  Temp: 98.2 F (36.8 C) 98.6 F (37 C)  SpO2: 100% 95%    Physical Exam: Cardiac:  regular Lungs:  nonlabored Incisions:  dressing taken off L AKA. Incision is c/d/l without hematoma. Some swelling. Staples still in place.  CBC    Component Value Date/Time   WBC 5.7 03/16/2022 0149   RBC 4.43 03/16/2022 0149   HGB 10.8 (L) 03/16/2022 0149   HGB 14.2 10/09/2018 1125   HCT 34.3 (L) 03/16/2022 0149   HCT 44.6 10/09/2018 1125   PLT 323 03/16/2022 0149   PLT 289 10/09/2018 1125   MCV 77.4 (L) 03/16/2022 0149   MCV 79 10/09/2018 1125   MCH 24.4 (L) 03/16/2022 0149   MCHC 31.5 03/16/2022 0149   RDW 16.9 (H) 03/16/2022 0149   RDW 14.8 10/09/2018 1125   LYMPHSABS 2.1 11/22/2021 0415   MONOABS 0.4 11/22/2021 0415   EOSABS 0.2 11/22/2021 0415   BASOSABS 0.1 11/22/2021 0415    BMET    Component Value Date/Time   NA 137 03/16/2022 0149   NA 135 10/09/2018 1125   K 4.1 03/16/2022 0149   CL 107 03/16/2022 0149   CO2 24 03/16/2022 0149   GLUCOSE 123 (H) 03/16/2022 0149   BUN 16 03/16/2022 0149   BUN 10 10/09/2018 1125   CREATININE 0.97 03/16/2022 0149   CREATININE 1.13 (H) 08/03/2016 1440   CALCIUM 8.5 (L) 03/16/2022 0149   GFRNONAA >60 03/16/2022 0149   GFRAA >60 11/24/2019 0620    INR    Component Value Date/Time   INR 1.1 03/14/2022 0836     Intake/Output Summary (Last 24 hours) at 03/16/2022 0734 Last data filed at 03/16/2022 0325 Gross per 24 hour  Intake 480 ml  Output 2050 ml  Net -1570 ml     Assessment/Plan:  66 y.o. 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 States her pain is bothering her more today and it comes in waves of sharp pains. She would like to stay one more day. -Dressing taken down. L femoral amputation site is c/d/l with staples. Incision site looks good with minimal swelling -Potentially home tomorrow with HHPT   Loel Dubonnet, PA-C [facility] [phone] 03/16/2022 7:34 AM  I have independently interviewed and examined patient and agree with PA assessment and plan above. No evidence of bleeding from left aka site. Dressing replaced and will re-eval tomorrow for dc.   Eulon Allnutt C. Randie Heinz, MD [facility] [place] Office: [phone] Pager: [phone]

## 2022-03-16 NOTE — Plan of Care (Signed)
  Problem: Education: Goal: Knowledge of the prescribed therapeutic regimen will improve Outcome: Progressing Goal: Ability to verbalize activity precautions or restrictions will improve Outcome: Progressing Goal: Understanding of discharge needs will improve Outcome: Progressing   Problem: Activity: Goal: Ability to perform//tolerate increased activity and mobilize with assistive devices will improve Outcome: Progressing   Problem: Clinical Measurements: Goal: Postoperative complications will be avoided or minimized Outcome: Progressing   Problem: Self-Care: Goal: Ability to meet self-care needs will improve Outcome: Progressing   Problem: Self-Concept: Goal: Ability to maintain and perform role responsibilities to the fullest extent possible will improve Outcome: Progressing   Problem: Pain Management: Goal: Pain level will decrease with appropriate interventions Outcome: Progressing   Problem: Skin Integrity: Goal: Demonstration of wound healing without infection will improve Outcome: Progressing   Problem: Education: Goal: Knowledge of General Education information will improve Description: Including pain rating scale, medication(s)/side effects and non-pharmacologic comfort measures Outcome: Progressing   Problem: Health Behavior/Discharge Planning: Goal: Ability to manage health-related needs will improve Outcome: Progressing   Problem: Clinical Measurements: Goal: Ability to maintain clinical measurements within normal limits will improve Outcome: Progressing Goal: Will remain free from infection Outcome: Progressing Goal: Diagnostic test results will improve Outcome: Progressing Goal: Respiratory complications will improve Outcome: Progressing Goal: Cardiovascular complication will be avoided Outcome: Progressing   Problem: Nutrition: Goal: Adequate nutrition will be maintained Outcome: Progressing   Problem: Coping: Goal: Level of anxiety will  decrease Outcome: Progressing   Problem: Elimination: Goal: Will not experience complications related to bowel motility Outcome: Progressing Goal: Will not experience complications related to urinary retention Outcome: Progressing   Problem: Safety: Goal: Ability to remain free from injury will improve Outcome: Progressing

## 2022-03-16 NOTE — Progress Notes (Signed)
Nurse assessed pt incision to find it oozing bright red sanguinous drainage. Pt stated [facility] PA removed bandage to leave OTA. Nurse paged PA to inform and orders given to rewrap stump until MD comes to assess.  Brooke Pace, RN

## 2022-03-17 ENCOUNTER — Other Ambulatory Visit (HOSPITAL_COMMUNITY): Payer: Self-pay

## 2022-03-17 MED ORDER — EZETIMIBE 10 MG PO TABS
10.0000 mg | ORAL_TABLET | Freq: Every day | ORAL | 3 refills | Status: DC
  Filled 2022-03-17: qty 30, 30d supply, fill #0

## 2022-03-17 MED ORDER — HYDROCODONE-ACETAMINOPHEN 5-325 MG PO TABS
1.0000 | ORAL_TABLET | Freq: Four times a day (QID) | ORAL | 0 refills | Status: DC | PRN
  Filled 2022-03-17: qty 20, 3d supply, fill #0

## 2022-03-17 NOTE — Progress Notes (Addendum)
  Progress Note    03/17/2022 7:37 AM 3 Days Post-Op  Subjective:  Patient is feeling better today. Pain is not bothering her as much and she is ready to go home   Vitals:   03/17/22 0000 03/17/22 0427  BP: 135/73 133/73  Pulse: 62 63  Resp: 16 17  Temp: 98.6 F (37 C) 98 F (36.7 C)  SpO2: 100% 96%    Physical Exam: Cardiac:  regular Lungs:  nonlabored Incisions:  L AKA dressing taken off. Incision is c/d/l without bleeding or hematoma. Staples present  CBC    Component Value Date/Time   WBC 5.7 03/16/2022 0149   RBC 4.43 03/16/2022 0149   HGB 10.8 (L) 03/16/2022 0149   HGB 14.2 10/09/2018 1125   HCT 34.3 (L) 03/16/2022 0149   HCT 44.6 10/09/2018 1125   PLT 323 03/16/2022 0149   PLT 289 10/09/2018 1125   MCV 77.4 (L) 03/16/2022 0149   MCV 79 10/09/2018 1125   MCH 24.4 (L) 03/16/2022 0149   MCHC 31.5 03/16/2022 0149   RDW 16.9 (H) 03/16/2022 0149   RDW 14.8 10/09/2018 1125   LYMPHSABS 2.1 11/22/2021 0415   MONOABS 0.4 11/22/2021 0415   EOSABS 0.2 11/22/2021 0415   BASOSABS 0.1 11/22/2021 0415    BMET    Component Value Date/Time   NA 137 03/16/2022 0149   NA 135 10/09/2018 1125   K 4.1 03/16/2022 0149   CL 107 03/16/2022 0149   CO2 24 03/16/2022 0149   GLUCOSE 123 (H) 03/16/2022 0149   BUN 16 03/16/2022 0149   BUN 10 10/09/2018 1125   CREATININE 0.97 03/16/2022 0149   CREATININE 1.13 (H) 08/03/2016 1440   CALCIUM 8.5 (L) 03/16/2022 0149   GFRNONAA >60 03/16/2022 0149   GFRAA >60 11/24/2019 0620    INR    Component Value Date/Time   INR 1.1 03/14/2022 0836     Intake/Output Summary (Last 24 hours) at 03/17/2022 0737 Last data filed at 03/17/2022 0428 Gross per 24 hour  Intake 100 ml  Output 1300 ml  Net -1200 ml     Assessment/Plan:  66 y.o. 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 No bleeding  from incision site. Incision site is c/d/l with staples. -Home today with HHPT. Will place orders for d/c   Loel Dubonnet, PA-C [facility] [phone] 03/17/2022 7:37 AM    I have independently interviewed and examined patient and agree with PA assessment and plan above. Ok for home today.  Naomia Lenderman C. Randie Heinz, MD [facility] [place] Office: [phone] Pager: [phone]

## 2022-03-17 NOTE — Progress Notes (Signed)
Mobility Specialist: Progress Note   03/17/22 1225  Mobility  Activity Transferred from bed to chair  Level of Assistance Contact guard assist, steadying assist  Assistive Device Wheelchair  Activity Response Tolerated well  $Mobility charge 1 Mobility   Pt assisted to the wheelchair and into vehicle for discharge.   [facility] Avabella Wailes Mobility Specialist Mobility Specialist [address]: [phone]

## 2022-03-17 NOTE — Plan of Care (Signed)
  Problem: Education: Goal: Knowledge of the prescribed therapeutic regimen will improve Outcome: Adequate for Discharge Goal: Ability to verbalize activity precautions or restrictions will improve Outcome: Adequate for Discharge Goal: Understanding of discharge needs will improve Outcome: Adequate for Discharge   Problem: Activity: Goal: Ability to perform//tolerate increased activity and mobilize with assistive devices will improve Outcome: Adequate for Discharge   Problem: Clinical Measurements: Goal: Postoperative complications will be avoided or minimized Outcome: Adequate for Discharge   Problem: Self-Care: Goal: Ability to meet self-care needs will improve Outcome: Adequate for Discharge   Problem: Self-Concept: Goal: Ability to maintain and perform role responsibilities to the fullest extent possible will improve Outcome: Adequate for Discharge   Problem: Pain Management: Goal: Pain level will decrease with appropriate interventions Outcome: Adequate for Discharge   Problem: Skin Integrity: Goal: Demonstration of wound healing without infection will improve Outcome: Adequate for Discharge   Problem: Education: Goal: Knowledge of General Education information will improve Description: Including pain rating scale, medication(s)/side effects and non-pharmacologic comfort measures Outcome: Adequate for Discharge   Problem: Health Behavior/Discharge Planning: Goal: Ability to manage health-related needs will improve Outcome: Adequate for Discharge   Problem: Clinical Measurements: Goal: Ability to maintain clinical measurements within normal limits will improve Outcome: Adequate for Discharge Goal: Will remain free from infection Outcome: Adequate for Discharge Goal: Diagnostic test results will improve Outcome: Adequate for Discharge Goal: Respiratory complications will improve Outcome: Adequate for Discharge Goal: Cardiovascular complication will be  avoided Outcome: Adequate for Discharge   Problem: Nutrition: Goal: Adequate nutrition will be maintained Outcome: Adequate for Discharge   Problem: Coping: Goal: Level of anxiety will decrease Outcome: Adequate for Discharge   Problem: Elimination: Goal: Will not experience complications related to bowel motility Outcome: Adequate for Discharge Goal: Will not experience complications related to urinary retention Outcome: Adequate for Discharge   Problem: Safety: Goal: Ability to remain free from injury will improve Outcome: Adequate for Discharge   Problem: Acute Rehab OT Goals (only OT should resolve) Goal: Pt. Will Perform Grooming Outcome: Adequate for Discharge Goal: Pt. Will Perform Lower Body Dressing Outcome: Adequate for Discharge Goal: Pt. Will Transfer To Toilet Outcome: Adequate for Discharge Goal: Pt/Caregiver Will Perform Home Exercise Program Outcome: Adequate for Discharge   Problem: Acute Rehab PT Goals(only PT should resolve) Goal: Pt will Roll Supine to Side Outcome: Adequate for Discharge Goal: Pt Will Go Supine/Side To Sit Outcome: Adequate for Discharge Goal: Pt Will Transfer Bed To Chair/Chair To Bed Outcome: Adequate for Discharge Goal: Pt/caregiver will Perform Home Exercise Program Outcome: Adequate for Discharge Goal: PT Additional Goal #1 Outcome: Adequate for Discharge

## 2022-03-17 NOTE — TOC Transition Note (Signed)
Transition of Care (TOC) - CM/SW Discharge Note Donn Pierini RN, BSN Transitions of Care Unit 4E- RN Case Manager See Treatment Team for direct phone #    Patient Details  Name: Mercedes [name] MRN: [id number] Date of Birth: 05-06-1956  Transition of Care [facility]) CM/SW Contact:  Darrold Span, RN Phone Number: 03/17/2022, 10:38 AM   Clinical Narrative:    Pt stable for transition home s/p revision of left AKA. CM was notified by [facility] that they have vascular office referral for [facility] protocol. Cm spoke with pt who confirmed and states she has spoken with [facility]. CM sent msg to Misty Stanley w/ [facility] to notify of discharge home today for start of care.   Pt reports she has all needed DME, very happy to be returning home today. Awaiting meds to come from [facility] pharmacy then will call her ride for transport home.   No further TOC needs noted.    Final next level of care: Home w Home Health Services Barriers to Discharge: No Barriers Identified   Patient Goals and CMS Choice Patient states their goals for this hospitalization and ongoing recovery are:: return home CMS Medicare.gov Compare Post Acute Care list provided to:: Patient Choice offered to / list presented to : Patient  Discharge Placement               Home w/ [facility]        Discharge Plan and Services   Discharge Planning Services: CM Consult Post Acute Care Choice: Home Health          DME Arranged: N/A DME Agency: NA       HH Arranged: PT HH Agency: [facility] Date [facility] Agency Contacted: 03/17/22 Time HH Agency Contacted: 1038 Representative spoke with at [facility] Agency: Misty Stanley  Social Determinants of Health (SDOH) Interventions     Readmission Risk Interventions    03/17/2022   10:38 AM 09/08/2020   12:03 PM 10/06/2019    3:08 PM  Readmission Risk Prevention Plan  Post Dischage Appt Complete    Medication Screening Complete    Transportation Screening Complete Complete Complete  PCP or Specialist  Appt within 5-7 Days  Complete Complete  Home Care Screening  Complete Complete  Medication Review (RN CM)  Complete Complete

## 2022-03-17 NOTE — Care Management Important Message (Signed)
Important Message  Patient Details  Name: Mercedes [name] MRN: [id number] Date of Birth: 02-11-1956   Medicare Important Message Given:  Yes     [name] 03/17/2022, 9:49 AM

## 2022-03-17 NOTE — Progress Notes (Signed)
Patient given discharge instructions and stated understanding. 

## 2022-03-18 DIAGNOSIS — Z4781 Encounter for orthopedic aftercare following surgical amputation: Secondary | ICD-10-CM | POA: Diagnosis not present

## 2022-03-18 DIAGNOSIS — I739 Peripheral vascular disease, unspecified: Secondary | ICD-10-CM | POA: Diagnosis not present

## 2022-03-18 DIAGNOSIS — Z950 Presence of cardiac pacemaker: Secondary | ICD-10-CM | POA: Diagnosis not present

## 2022-03-18 DIAGNOSIS — G546 Phantom limb syndrome with pain: Secondary | ICD-10-CM | POA: Diagnosis not present

## 2022-03-18 DIAGNOSIS — E785 Hyperlipidemia, unspecified: Secondary | ICD-10-CM | POA: Diagnosis not present

## 2022-03-18 DIAGNOSIS — I2510 Atherosclerotic heart disease of native coronary artery without angina pectoris: Secondary | ICD-10-CM | POA: Diagnosis not present

## 2022-03-18 DIAGNOSIS — I5181 Takotsubo syndrome: Secondary | ICD-10-CM | POA: Diagnosis not present

## 2022-03-18 DIAGNOSIS — Z89611 Acquired absence of right leg above knee: Secondary | ICD-10-CM | POA: Diagnosis not present

## 2022-03-18 DIAGNOSIS — I119 Hypertensive heart disease without heart failure: Secondary | ICD-10-CM | POA: Diagnosis not present

## 2022-03-18 DIAGNOSIS — Z89612 Acquired absence of left leg above knee: Secondary | ICD-10-CM | POA: Diagnosis not present

## 2022-03-20 DIAGNOSIS — Z89612 Acquired absence of left leg above knee: Secondary | ICD-10-CM | POA: Diagnosis not present

## 2022-03-20 DIAGNOSIS — I10 Essential (primary) hypertension: Secondary | ICD-10-CM | POA: Diagnosis not present

## 2022-03-20 DIAGNOSIS — Z89611 Acquired absence of right leg above knee: Secondary | ICD-10-CM | POA: Diagnosis not present

## 2022-03-21 ENCOUNTER — Telehealth: Payer: Medicare Other

## 2022-03-23 DIAGNOSIS — E785 Hyperlipidemia, unspecified: Secondary | ICD-10-CM | POA: Diagnosis not present

## 2022-03-23 DIAGNOSIS — I119 Hypertensive heart disease without heart failure: Secondary | ICD-10-CM | POA: Diagnosis not present

## 2022-03-23 DIAGNOSIS — I5181 Takotsubo syndrome: Secondary | ICD-10-CM | POA: Diagnosis not present

## 2022-03-23 DIAGNOSIS — Z89612 Acquired absence of left leg above knee: Secondary | ICD-10-CM | POA: Diagnosis not present

## 2022-03-23 DIAGNOSIS — Z4781 Encounter for orthopedic aftercare following surgical amputation: Secondary | ICD-10-CM | POA: Diagnosis not present

## 2022-03-23 DIAGNOSIS — Z950 Presence of cardiac pacemaker: Secondary | ICD-10-CM | POA: Diagnosis not present

## 2022-03-23 DIAGNOSIS — G546 Phantom limb syndrome with pain: Secondary | ICD-10-CM | POA: Diagnosis not present

## 2022-03-23 DIAGNOSIS — Z89611 Acquired absence of right leg above knee: Secondary | ICD-10-CM | POA: Diagnosis not present

## 2022-03-23 DIAGNOSIS — I2510 Atherosclerotic heart disease of native coronary artery without angina pectoris: Secondary | ICD-10-CM | POA: Diagnosis not present

## 2022-03-23 DIAGNOSIS — I739 Peripheral vascular disease, unspecified: Secondary | ICD-10-CM | POA: Diagnosis not present

## 2022-03-23 NOTE — Discharge Summary (Signed)
Discharge Summary  Patient ID: Mercedes [name] [id number] 66 y.o. 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  She is now indicated for revision with conversion to a higher amputation.  Hospital Course: Patient was admitted to the hospital on 03/14/2022 with wound over previous left above knee amputation. The patient was taken to the OR on 03/14/2022 for revision of above knee amputation and conversion to higher amputation. After the procedure, the patient was taken to the PACU and in stable condition.  Patient was progressing well post-op and resumed normal diet without issues swallowing. Patient experienced some issues with pain control on POD 2 so she remained in the hospital for one more day. Left amputation site dressing was taken down on POD 3, without bleeding issues. Amputation site was clean, dry, and intact with no hematoma.   Patient was discharged home with home health physical therapy on 03/17/2022.  Consults: None  Treatments: IV hydration, antibiotics: Ancef, analgesia: Vicodin, Morphine anticoagulation: Plavix and Eliquis, therapies: PT and OT, and surgery: left above knee amputation with revision to higher amputation   Vitals:   03/17/22 0427 03/17/22 0842  BP: 133/73 (!) 141/91  Pulse: 63   Resp: 17 15  Temp: 98 F (36.7 C) 98.1 F (36.7 C)  SpO2: 96% 97%     Disposition: Discharge disposition: 01-Home or Self Care       Patient Instructions:  Allergies  as of 03/17/2022       Reactions   Brilinta [ticagrelor] Anaphylaxis, Shortness Of Breath, Other (See Comments)   Also, chest tightness   Latex Rash   Tape Rash        Medication List     TAKE these medications    acetaminophen 500 MG tablet Commonly known as: TYLENOL Take 500-1,000 mg by mouth every 8 (eight) hours as needed for mild pain or headache.   apixaban 5 MG Tabs tablet Commonly known as: Eliquis Take 1 tablet (5 mg total) by mouth 2 (two) times daily.   clopidogrel 75 MG tablet Commonly known as: PLAVIX Take 1 tablet (75 mg total) by mouth daily.   ezetimibe 10 MG tablet Commonly known as: ZETIA Take 1 tablet (10 mg total) by mouth daily.   gabapentin 100 MG capsule Commonly known as: Neurontin Take 1 capsule (100 mg total) by mouth 3 (three) times daily.   HYDROcodone-acetaminophen 5-325 MG tablet Commonly known as: NORCO/VICODIN Take 1-2 tablets by mouth every 6 (six) hours as needed for moderate pain (pain score 4-6).   metoprolol tartrate 25 MG tablet Commonly known as: LOPRESSOR Take 0.5 tablets (12.5 mg total) by mouth 2 (two) times daily.   nitroGLYCERIN 0.4 MG SL tablet Commonly known as: NITROSTAT Place 1 tablet (0.4 mg total) under the tongue every 5 (five) minutes as needed for chest pain.   rosuvastatin 40 MG tablet Commonly known as: CRESTOR Take 1 tablet (40 mg total) by mouth at bedtime.               Discharge Care Instructions  (From admission, onward)  Start     Ordered   03/17/22 0000  Discharge wound care:       Comments: Keep clean and dry. Wash daily with warm water and mild soap. Pat dry. Do not soak in tub or water.   03/17/22 0272           Activity: activity as tolerated Diet: low fat, low cholesterol diet Wound Care: keep wound clean and dry  Follow-up with [facility]  in 4 weeks.  SignedErnestene Mention, PA-C 03/23/2022 7:57 AM [facility] Office: [phone]

## 2022-03-27 DIAGNOSIS — I119 Hypertensive heart disease without heart failure: Secondary | ICD-10-CM | POA: Diagnosis not present

## 2022-03-27 DIAGNOSIS — Z89612 Acquired absence of left leg above knee: Secondary | ICD-10-CM | POA: Diagnosis not present

## 2022-03-27 DIAGNOSIS — I2510 Atherosclerotic heart disease of native coronary artery without angina pectoris: Secondary | ICD-10-CM | POA: Diagnosis not present

## 2022-03-27 DIAGNOSIS — E785 Hyperlipidemia, unspecified: Secondary | ICD-10-CM | POA: Diagnosis not present

## 2022-03-27 DIAGNOSIS — I739 Peripheral vascular disease, unspecified: Secondary | ICD-10-CM | POA: Diagnosis not present

## 2022-03-27 DIAGNOSIS — Z4781 Encounter for orthopedic aftercare following surgical amputation: Secondary | ICD-10-CM | POA: Diagnosis not present

## 2022-03-27 DIAGNOSIS — I5181 Takotsubo syndrome: Secondary | ICD-10-CM | POA: Diagnosis not present

## 2022-03-27 DIAGNOSIS — G546 Phantom limb syndrome with pain: Secondary | ICD-10-CM | POA: Diagnosis not present

## 2022-03-27 DIAGNOSIS — Z89611 Acquired absence of right leg above knee: Secondary | ICD-10-CM | POA: Diagnosis not present

## 2022-03-27 DIAGNOSIS — Z950 Presence of cardiac pacemaker: Secondary | ICD-10-CM | POA: Diagnosis not present

## 2022-04-06 ENCOUNTER — Telehealth: Payer: Self-pay

## 2022-04-06 DIAGNOSIS — Z89612 Acquired absence of left leg above knee: Secondary | ICD-10-CM | POA: Diagnosis not present

## 2022-04-06 DIAGNOSIS — E785 Hyperlipidemia, unspecified: Secondary | ICD-10-CM | POA: Diagnosis not present

## 2022-04-06 DIAGNOSIS — I739 Peripheral vascular disease, unspecified: Secondary | ICD-10-CM | POA: Diagnosis not present

## 2022-04-06 DIAGNOSIS — Z950 Presence of cardiac pacemaker: Secondary | ICD-10-CM | POA: Diagnosis not present

## 2022-04-06 DIAGNOSIS — I2510 Atherosclerotic heart disease of native coronary artery without angina pectoris: Secondary | ICD-10-CM | POA: Diagnosis not present

## 2022-04-06 DIAGNOSIS — G546 Phantom limb syndrome with pain: Secondary | ICD-10-CM | POA: Diagnosis not present

## 2022-04-06 DIAGNOSIS — Z4781 Encounter for orthopedic aftercare following surgical amputation: Secondary | ICD-10-CM | POA: Diagnosis not present

## 2022-04-06 DIAGNOSIS — I119 Hypertensive heart disease without heart failure: Secondary | ICD-10-CM | POA: Diagnosis not present

## 2022-04-06 DIAGNOSIS — I5181 Takotsubo syndrome: Secondary | ICD-10-CM | POA: Diagnosis not present

## 2022-04-06 DIAGNOSIS — Z89611 Acquired absence of right leg above knee: Secondary | ICD-10-CM | POA: Diagnosis not present

## 2022-04-06 NOTE — Telephone Encounter (Signed)
Mercedes [name] with [facility] called stating that the pt's L AKA is swollen, possibly agitated by her RLS during the night. She questioned whether taking Hydrocodone would be helpful for the inflammation.  Reviewed pt's chart, returned call for clarification, no answer, lf vm.  Mercedes [name] returned call. She stated that after assisting pt with proper elevation, the swelling improved, so she will not need to take any meds. Confirmed understanding.

## 2022-04-11 DIAGNOSIS — Z950 Presence of cardiac pacemaker: Secondary | ICD-10-CM | POA: Diagnosis not present

## 2022-04-11 DIAGNOSIS — I5181 Takotsubo syndrome: Secondary | ICD-10-CM | POA: Diagnosis not present

## 2022-04-11 DIAGNOSIS — I2510 Atherosclerotic heart disease of native coronary artery without angina pectoris: Secondary | ICD-10-CM | POA: Diagnosis not present

## 2022-04-11 DIAGNOSIS — I739 Peripheral vascular disease, unspecified: Secondary | ICD-10-CM | POA: Diagnosis not present

## 2022-04-11 DIAGNOSIS — Z89612 Acquired absence of left leg above knee: Secondary | ICD-10-CM | POA: Diagnosis not present

## 2022-04-11 DIAGNOSIS — Z4781 Encounter for orthopedic aftercare following surgical amputation: Secondary | ICD-10-CM | POA: Diagnosis not present

## 2022-04-11 DIAGNOSIS — E785 Hyperlipidemia, unspecified: Secondary | ICD-10-CM | POA: Diagnosis not present

## 2022-04-11 DIAGNOSIS — Z89611 Acquired absence of right leg above knee: Secondary | ICD-10-CM | POA: Diagnosis not present

## 2022-04-11 DIAGNOSIS — I119 Hypertensive heart disease without heart failure: Secondary | ICD-10-CM | POA: Diagnosis not present

## 2022-04-11 DIAGNOSIS — G546 Phantom limb syndrome with pain: Secondary | ICD-10-CM | POA: Diagnosis not present

## 2022-04-12 ENCOUNTER — Ambulatory Visit (INDEPENDENT_AMBULATORY_CARE_PROVIDER_SITE_OTHER): Payer: Medicare Other | Admitting: Physician Assistant

## 2022-04-12 VITALS — BP 156/85 | HR 60 | Temp 98.0°F | Resp 20 | Ht 65.0 in | Wt 94.0 lb

## 2022-04-12 DIAGNOSIS — Z89611 Acquired absence of right leg above knee: Secondary | ICD-10-CM

## 2022-04-12 DIAGNOSIS — Z89612 Acquired absence of left leg above knee: Secondary | ICD-10-CM

## 2022-04-12 NOTE — Progress Notes (Signed)
POST OPERATIVE OFFICE NOTE    CC:  F/u for surgery  HPI: Mercedes [name] is a 66 year old female with a history of bilateral above-knee amputations fitted for prosthetics.  She developed an ulcerated area over her left above-knee amputation around 01/18/2022.  This was likely due to too much bone.  She underwent left AKA revision with conversion to left mid femoral amputation on 03/14/2022 by Dr. Randie Heinz.  She was discharged on postop day 3 with home health PT.  She returns today for follow-up and staple removal.  She states her left leg has been doing well.  She has been experiencing some swelling of the leg and tenderness to the medial portion of the incision.  She denies hitting her leg on anything.  She.she denies fever, chills, drainage from the incision site.  She states that the home health worker who comes to visit has reassured her that her incision looks fine.  She is ready to get back to PT and back into her prosthetics.   Allergies  Allergen Reactions   Brilinta [Ticagrelor] Anaphylaxis, Shortness Of Breath and Other (See Comments)    Also, chest tightness   Latex Rash   Tape Rash    Current Outpatient Medications  Medication Sig Dispense Refill   acetaminophen (TYLENOL) 500 MG tablet Take 500-1,000 mg by mouth every 8 (eight) hours as needed for mild pain or headache.     apixaban (ELIQUIS) 5 MG TABS tablet Take 1 tablet (5 mg total) by mouth 2 (two) times daily. 180 tablet 0   clopidogrel (PLAVIX) 75 MG tablet Take 1 tablet (75 mg total) by mouth daily. 30 tablet 0   ezetimibe (ZETIA) 10 MG tablet Take 1 tablet (10 mg total) by mouth daily. 30 tablet 3   gabapentin (NEURONTIN) 100 MG capsule Take 1 capsule (100 mg total) by mouth 3 (three) times daily. 90 capsule 3   HYDROcodone-acetaminophen (NORCO/VICODIN) 5-325 MG tablet Take 1-2 tablets by mouth every 6 (six) hours as needed for moderate pain (pain score 4-6). 20 tablet 0   metoprolol tartrate (LOPRESSOR) 25 MG tablet Take  0.5 tablets (12.5 mg total) by mouth 2 (two) times daily. 180 tablet 1   nitroGLYCERIN (NITROSTAT) 0.4 MG SL tablet Place 1 tablet (0.4 mg total) under the tongue every 5 (five) minutes as needed for chest pain. 25 tablet 11   rosuvastatin (CRESTOR) 40 MG tablet Take 1 tablet (40 mg total) by mouth at bedtime. 90 tablet 3   No current facility-administered medications for this visit.     ROS:  See HPI  Physical Exam:   Incision:  Some swelling around the L mid femoral amputation site. No drainage, hematoma, or dehiscence. Incision is healing well. Staples removed Extremities: Moving freely  Assessment/Plan:  This is a 66 y.o. female who is s/p: Revision of L AKA and conversion to mid femoral amputation on 03/14/2022   - No signs of infection. L amputation site healing well with minimal swelling. Staples removed today - Patient needs to wear shrinker again and [facility] the L leg - Follow up with our clinic as needed  The patient has right above knee amputation and left mid-femoral amputation. The patient is well motivated to return to their prior functional status by utilizing a prosthesis to perform ADL's and maintain a healthy lifestyle. The patient has the physical and cognitive capacity to function with a prosthesis.    Functional Level: K2 Limited Community Ambulator: Has  the ability or potential for ambulation and to traverse low environmental barriers such as curbs, stairs or uneven surfaces   Residual Limb History: The skin condition of the residual limb is great on the right, and healing well on the left. The patient will continue to monitor the skin of the residual limb and follow hygiene instructions. The patient is experiencing residual limb pain.   Loel Dubonnet, PA-C [facility] [phone]   Clinic MD:  Randie Heinz

## 2022-04-19 ENCOUNTER — Other Ambulatory Visit: Payer: Self-pay | Admitting: Internal Medicine

## 2022-06-12 ENCOUNTER — Telehealth: Payer: Self-pay

## 2022-06-12 NOTE — Telephone Encounter (Signed)
Left voice mail for pt to call back to schedule Medicare wellness with nurse health advisor or nurse AWV

## 2022-06-16 ENCOUNTER — Ambulatory Visit: Payer: Medicare Other | Admitting: Internal Medicine

## 2022-06-21 ENCOUNTER — Telehealth: Payer: Self-pay

## 2022-06-21 NOTE — Telephone Encounter (Signed)
Pt called requesting a referral to PT that was suggested to her by [facility].  Referral placed.  Pt called inquiring about referral.  Reviewed pt's chart, returned call, two identifiers used. Informed pt of referral. Gave her information on location where referral was sent. Confirmed understanding.

## 2022-06-24 NOTE — Progress Notes (Addendum)
Cardiology Office Note:    Date:  06/29/2022   ID:  Mercedes [name], DOB 01-28-1956, MRN [id number]  PCP:  Mercedes Rail, MD  Cardiologist:  Mercedes Ruths, MD  Electrophysiologist:  Mercedes Grayer, MD   Referring MD: Mercedes Rail, MD   Chief Complaint: follow-up with CAD and atrial fibrillation  History of Present Illness:    Mercedes [name] is a 66 y.o. female with a history of CAD s/p prior stenting to LCX (date of this unknown) and subsequent CABG x3 (LIMA to LAD, SVG to OM, and SVG to distal RCA) in 06/2016, paroxysmal atrial fibrillation on Eliquis, tachy-brady syndrome s/p  leadless PPM in 10/2014 which is now no longer functioning, PAD s/p multiple interventions of bilateral lower extremities including left above knee amputation in 09/2019 and right above knee amputation in 10/2020 with revision of left amputation in 02/2022, bilateral carotid stenosis (left > right), CVA in 3/023, hypertension, hyperlipidemia, GERD, SLE, rheumatoid arthritis, and Sjogren's syndrome who is followed by Dr. Stanford [name] and Dr. Rayann [name] and presents today for routine follow-up.  Patient has a history of CAD and was admitted with an NSTEMI in 06/2016. Initial cath showed a widely patent LCX stent and evidence of focal thrombus at the distal left main and ostial LAD. She was treated with Aggrastat and then relook cath showed 40-50% stenosis of distal left main and 70-80% stenosis of ostial LAD. Echo showed LVEF of 44-31%, grade 2 diastolic dysfunction, mild left atrial enlargement, mild MR, and moderate TR. She ultimately underwent CABG on 11/20.2017 with LIMA to LAD, SVG to OM, and SVG to distal RCA. Last ischemic evaluation was a Myoview in 08/2020 which was a poor quality study but showed evidence of prior infarction with associated wall motion abnormality but less evidence to suggest anterolateral ischemia. She also has known atrial fibrillation and tachy-brady syndrome s/p leadless pacemaker in 10/2014. However,  this is no longer functioning. She had battery failure with RRT in in 09/2018 for which new PPM implant was offered but she declined. Thankfully, she has done well since then. In addition, patient has a history of significant PAD involving carotid arteries and lower extremities. She is followed by Dr. Oneida [name] and has had multiple interventions of bilateral lower extremities ultimately leading to left above knee amputation in 09/2019 and right above knee amputation in 10/2020.   Patient was last seen by Dr. Rayann [name] in 06/2021 at which time she was doing well from a cardiac standpoint. She once again declined removal of non-functioning leadless pacemaker.   She was admitted in 10/2021 with acute ischemic stroke after presenting with severe headache and new onset speech difficulty. Echo showed LVEF of 55-60% with no regional wall motion abnormalities, normal RV, and trivial MR. Carotid ultrasounds showed 1-39% stenosis of right ICA and 40-59% stenosis of left ICA. She was started on Plavix in addition to Eliquis which she was taking at home. She was admitted in 02/2022 with a bone exposed through a small wound of her left amputation site and required revision with conversion to a higher amputation on 03/14/2022.  Patient presents today for routine follow-up. Here alone. Patient is overall doing well from a cardiac standpoint. Her only concern today is some intermittent shortness of breath that occurs with minimal exertion such as with transfers in and out of her wheelchair or if she turns quickly while cooking. She states this shortness of breath only last for a couple of seconds. She states she first noticed this  after her stroke. It does not occur every day. No prolonged episodes of shortness of breath. She thinks this is due to deconditioning since she has been unable to use her prosthetics and has been "stuck" in her wheelchair since the revision of her left amputation in July. I agree that this sounds like it is due  to deconditioning. This is not similar to her prior anginal symptoms. She denies any chest pain. No shortness of breath at rest. No orthopnea or PND. She states she has some mild edema of left thigh since the revision of her amputation on that side. She reports occasional palpitations that she describes as heart racing but these are very brief. No prolonged/ sustained palpitations. No lightheadedness, dizziness, or syncope.  Past Medical History:  Diagnosis Date   Abnormal LFTs    a. in the past when on Lipitor   Anemia    as a young woman   Anxiety    Arnold-Chiari malformation, type I (HCC)    MRI brain 02/2010   CAD (coronary artery disease)    a. NSTEMI 07/2009: LHC - D2 40%, LAD irreg., EF 50% with apical AK (tako-tsubo CM);  b. Inf STEMI (04/2013): Tx Promus DES to CFX;  c. 08/2013 Lexi CL: No ischemia, dist ant/ap/inf-ap infarct, EF  d. 06/2016 NSTEMI with LM disease: s/p CABG on 07/17/2016 w/ LIMA-LAD, SVG-OM, and SVG-dRCA.   DEPRESSION    Dizziness    ? 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 anticoagulated with Eliquis 10/2014   Presence of permanent cardiac pacemaker 10/2014   leadless permanent pacemaker   RA (rheumatoid arthritis) (HCC)    prior tx by Dr. Dareen Piano   Sjogren's syndrome (HCC) 06/02/2013   pt denies this (12/01/13)   SLE (systemic lupus erythematosus) (HCC) dx 05/2013   follows with rheum Dareen Piano)   Stroke [facility])    March 2023, weakness in right arm is improving   Tachy-brady syndrome (HCC)    a. 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   f/u echo 09/2009: EF 50-55%, mild  LVH, mod diast dysfxn, mild apical HK   Wears dentures    Wears glasses     Past Surgical History:  Procedure Laterality Date   ABDOMINAL AORTAGRAM N/A 11/24/2013   Procedure: ABDOMINAL AORTAGRAM WITH BILATERAL RUNOFF WITH POSSIBLE INTERVENTION;  Surgeon: Chuck Hint, MD;  Location: [facility] CATH LAB;  Service: Cardiovascular;  Laterality: N/A;   ABDOMINAL AORTOGRAM W/LOWER EXTREMITY N/A 04/25/2019   Procedure: ABDOMINAL AORTOGRAM W/LOWER EXTREMITY;  Surgeon: Sherren Kerns, MD;  Location: [facility] INVASIVE CV LAB;  Service: Cardiovascular;  Laterality: N/A;   ABDOMINAL AORTOGRAM W/LOWER EXTREMITY Bilateral 05/15/2019   Procedure: ABDOMINAL AORTOGRAM W/LOWER EXTREMITY;  Surgeon: Nada Libman, MD;  Location: [facility] INVASIVE CV LAB;  Service: Cardiovascular;  Laterality: Bilateral;   ABDOMINAL AORTOGRAM W/LOWER EXTREMITY N/A 08/23/2020   Procedure: ABDOMINAL AORTOGRAM W/LOWER EXTREMITY;  Surgeon: Maeola Harman, MD;  Location: [facility] INVASIVE CV LAB;  Service: Cardiovascular;  Laterality: N/A;   ABDOMINAL AORTOGRAM W/LOWER EXTREMITY N/A 10/29/2020   Procedure: ABDOMINAL  AORTOGRAM W/LOWER EXTREMITY;  Surgeon: Sherren Kerns, MD;  Location: [facility] INVASIVE CV LAB;  Service: Cardiovascular;  Laterality: N/A;   AMPUTATION Left 12/02/2013   Procedure: LEFT 1ST TOE AMPUTATION;  Surgeon: Sherren Kerns, MD;  Location: [facility] OR;  Service: Vascular;  Laterality: Left;   AMPUTATION Left 07/16/2019   Procedure: LEFT TRANSMETATARSAL AMPUTATION;  Surgeon: Nadara Mustard, MD;  Location: [facility] OR;  Service: Orthopedics;  Laterality: Left;   AMPUTATION Left 10/01/2019   Procedure: LEFT AMPUTATION ABOVE KNEE;  Surgeon: Sherren Kerns, MD;  Location: [facility] OR;  Service: Vascular;  Laterality: Left;   AMPUTATION Right 08/31/2020   Procedure: Right first toe amputation;  Surgeon: Sherren Kerns, MD;  Location: [name] OR;  Service: Vascular;  Laterality: Right;   AMPUTATION Right 11/23/2020   Procedure: RIGHT AMPUTATION  ABOVE KNEE;  Surgeon: Sherren Kerns, MD;  Location: [facility] OR;  Service: Vascular;  Laterality: Right;   AMPUTATION Left 03/14/2022   Procedure: REVISION LEFT ABOVE KNEE AMPUTATION;  Surgeon: Maeola Harman, MD;  Location: [facility] OR;  Service: Vascular;  Laterality: Left;   APPLICATION OF WOUND VAC Left 11/03/2019   Procedure: Application Of Wound Vac, left lower extremity;  Surgeon: Sherren Kerns, MD;  Location: [facility] OR;  Service: Vascular;  Laterality: Left;   CARDIAC CATHETERIZATION N/A 07/12/2016   Procedure: Left Heart Cath and Coronary Angiography;  Surgeon: Lennette Bihari, MD;  Location: [facility] INVASIVE CV LAB;  Service: Cardiovascular;  Laterality: N/A;   CARDIAC CATHETERIZATION N/A 07/14/2016   Procedure: Intravascular Ultrasound/IVUS;  Surgeon: Yvonne Kendall, MD;  Location: [facility] INVASIVE CV LAB;  Service: Cardiovascular;  Laterality: N/A;   COLONOSCOPY     CORONARY ANGIOPLASTY  04/2013   CORONARY ARTERY BYPASS GRAFT N/A 07/17/2016   Procedure: CORONARY ARTERY BYPASS GRAFTING (CABG)x3 using right greater saphenous vein harvested endoscopiclly and left internal mammary artery;  Surgeon: Kerin Perna, MD;  Location: [facility] OR;  Service: Open Heart Surgery;  Laterality: N/A;   ENDARTERECTOMY FEMORAL Left 05/12/2019   Procedure: LEFT Femoral Endarterectomy;  Surgeon: Sherren Kerns, MD;  Location: [facility] OR;  Service: Vascular;  Laterality: Left;   FEMORAL-POPLITEAL BYPASS GRAFT Left 12/02/2013   Procedure:  LEFT FEMORAL-BELOW KNEE POPLITEAL ARTERY BYPASS GRAFT;  Surgeon: Sherren Kerns, MD;  Location: [facility] OR;  Service: Vascular;  Laterality: Left;   FEMORAL-POPLITEAL BYPASS GRAFT Left 09/25/2019   Procedure: THROMBECTOMY and PROPATEN BYPASS  FEMORAL TO BELOW KNEE POPLITEAL ARTERY BYPASS GRAFT LEFT LEG;  Surgeon: Larina Earthly, MD;  Location: [facility] OR;  Service: Vascular;  Laterality: Left;   FEMORAL-POPLITEAL BYPASS GRAFT Right 08/24/2020   Procedure: RIGHT FEMORAL-BELOW KNEE POPLITEAL ARTERY  BYPASS USING GORE PROPATEN GRAFT;  Surgeon: Maeola Harman, MD;  Location: [facility] OR;  Service: Vascular;  Laterality: Right;   FEMORAL-POPLITEAL BYPASS GRAFT Right 11/01/2020   Procedure: Thrombectomy and Revision of Right Femoral to below knee Bypass with Interpostional graft to Tibial / peroneal Trunk and Endarterectomy Tibial Weston Settle Trunk.;  Surgeon: Sherren Kerns, MD;  Location: [facility] OR;  Service: Vascular;  Laterality: Right;   INTRAOPERATIVE ARTERIOGRAM Right 11/01/2020   Procedure: ANTEGRADE RIGHT LOWER EXTREMITY INTRA OPERATIVE ARTERIOGRAM;  Surgeon: Sherren Kerns, MD;  Location: [facility] OR;  Service: Vascular;  Laterality: Right;   LEFT HEART CATHETERIZATION WITH CORONARY ANGIOGRAM N/A 05/19/2013   Procedure: LEFT HEART CATHETERIZATION WITH CORONARY ANGIOGRAM;  Surgeon: Micheline Chapman, MD;  Location: [facility] CATH LAB;  Service: Cardiovascular;  Laterality: N/A;  LEFT HEART CATHETERIZATION WITH CORONARY ANGIOGRAM N/A 10/28/2014   Procedure: LEFT HEART CATHETERIZATION WITH CORONARY ANGIOGRAM;  Surgeon: Iran Ouch, MD;  Location: [facility] CATH LAB;  Service: Cardiovascular;  Laterality: N/A;   LOWER EXTREMITY ANGIOGRAM Left 05/12/2019   Procedure: Lower Extremity Angiogram;  Surgeon: Sherren Kerns, MD;  Location: [facility] OR;  Service: Vascular;  Laterality: Left;   PATCH ANGIOPLASTY Left 05/12/2019   Procedure: Left Femoral Patch Angioplasty USING CADAVERIC SAPHENOUS VEIN;  Surgeon: Sherren Kerns, MD;  Location: [facility] OR;  Service: Vascular;  Laterality: Left;   PERCUTANEOUS CORONARY STENT INTERVENTION (PCI-S)  05/19/2013   Procedure: PERCUTANEOUS CORONARY STENT INTERVENTION (PCI-S);  Surgeon: Micheline Chapman, MD;  Location: [facility] CATH LAB;  Service: Cardiovascular;;   PERIPHERAL VASCULAR INTERVENTION Right 10/29/2020   Procedure: PERIPHERAL VASCULAR INTERVENTION;  Surgeon: Sherren Kerns, MD;  Location: [facility] INVASIVE CV LAB;  Service: Cardiovascular;  Laterality: Right;  common iliac   PERMANENT  PACEMAKER INSERTION N/A 11/04/2014   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   TEE WITHOUT CARDIOVERSION N/A 07/17/2016   Procedure: TRANSESOPHAGEAL ECHOCARDIOGRAM (TEE);  Surgeon: Kerin Perna, MD;  Location: [facility] OR;  Service: Open Heart Surgery;  Laterality: N/A;   THROMBECTOMY FEMORAL ARTERY Left 05/12/2019   Procedure: Left Ilio-Femoral Artery and Femoral-popliteal Graft Thrombectomy;  Surgeon: Sherren Kerns, MD;  Location: [facility] OR;  Service: Vascular;  Laterality: Left;   THROMBECTOMY OF BYPASS GRAFT FEMORAL- POPLITEAL ARTERY Right 11/01/2020   Procedure: POSSIBLE THROMBOLYSIS VERSUS OPEN THROMBECTOMY AND REVSION OF RIGHT FEMORAL-POPLITEAL ARTERY BYPASS;  Surgeon: Sherren Kerns, MD;  Location: [facility] OR;  Service: Vascular;  Laterality: Right;   TUBAL LIGATION     VISCERAL ANGIOGRAPHY N/A 04/25/2019   Procedure: MESENTRIC  ANGIOGRAPHY;  Surgeon: Sherren Kerns, MD;  Location: [facility] INVASIVE CV LAB;  Service: Cardiovascular;  Laterality: N/A;   WOUND DEBRIDEMENT Left 11/03/2019   Procedure: Debridement Wound of left above the knee amputation;  Surgeon: Sherren Kerns, MD;  Location: [facility] OR;  Service: Vascular;  Laterality: Left;    Current Medications: Current Meds  Medication Sig   acetaminophen (TYLENOL) 500 MG tablet Take 500-1,000 mg by mouth every 8 (eight) hours as needed for mild pain or headache.   ELIQUIS 5 MG TABS tablet TAKE 1 TABLET BY MOUTH TWICE  DAILY   metoprolol tartrate (LOPRESSOR) 25 MG tablet Take 0.5 tablets (12.5 mg total) by mouth 2 (two) times daily.   nitroGLYCERIN (NITROSTAT) 0.4 MG SL tablet Place 1 tablet (0.4 mg total) under the tongue every 5 (five) minutes as needed for chest pain.   rosuvastatin (CRESTOR) 40 MG tablet Take 1 tablet (40 mg total) by mouth daily.     Allergies:   Brilinta [ticagrelor], Latex, and Tape   Social History   Socioeconomic History   Marital status: Single    Spouse name: Not on file   Number of children:  3   Years of education: Not on file   Highest education level: Not on file  Occupational History   Occupation: retired  Tobacco Use   Smoking status: Former    Types: Cigarettes    Quit date: 2019    Years since quitting: 4.8   Smokeless tobacco: Never   Tobacco comments:       Vaping Use   Vaping Use: Never used  Substance and Sexual Activity   Alcohol use: No    Alcohol/week: 0.0 standard drinks of alcohol   Drug use:  Not Currently    Types: Marijuana, Other-see comments    Comment:  and CBD oil  last use of both was 10/29/19   Sexual activity: Not on file  Other Topics Concern   Not on file  Social History Narrative   Lives alone.   Social Determinants of Health   Financial Resource Strain: Low Risk  (07/15/2021)   Overall Financial Resource Strain (CARDIA)    Difficulty of Paying Living Expenses: Not hard at all  Food Insecurity: No Food Insecurity (07/15/2021)   Hunger Vital Sign    Worried About Running Out of Food in the Last Year: Never true    Ran Out of Food in the Last Year: Never true  Transportation Needs: No Transportation Needs (07/15/2021)   PRAPARE - Administrator, Civil Service (Medical): No    Lack of Transportation (Non-Medical): No  Physical Activity: Inactive (07/15/2021)   Exercise Vital Sign    Days of Exercise per Week: 0 days    Minutes of Exercise per Session: 0 min  Stress: No Stress Concern Present (07/15/2021)   [employer] of Occupational Health - Occupational Stress Questionnaire    Feeling of Stress : Not at all  Social Connections: Unknown (07/15/2021)   Social Connection and Isolation Panel [NHANES]    Frequency of Communication with Friends and Family: Twice a week    Frequency of Social Gatherings with Friends and Family: Twice a week    Attends Religious Services: Never    Database administrator or Organizations: No    Attends Engineer, structural: Never    Marital Status: Patient refused      Family History: The patient's family history includes Arthritis in her father and mother; COPD in her father; Diabetes in her paternal aunt; Emphysema in her mother; Heart disease in her father and paternal aunt. There is no history of Thyroid disease.  ROS:   Please see the history of present illness.     EKGs/Labs/Other Studies Reviewed:    The following studies were reviewed:  Myoview 09/02/2020: There was no ST segment deviation noted during stress. No T wave inversion was noted during stress. Findings consistent with prior myocardial infarction. This is an intermediate risk study. The left ventricular ejection fraction is normal (55-65%). There is a medium sized, moderate defect that is non-reversible in the apex, there is associated wall motion abnormality consistent with prior infarct. There is an anteroseptal rest perfusion defect. Their anterolateral stress defect may be artifactual in the setting of patient movement between rest and stress, as noted by the change in location of the diaphragmatic attenuation.   Study suggestive of prior infarction with associated wall motion abnormality. Less evidence to suggest anterolateral ischemia.   Poor quality study. Compared to 2015 study report; infarct size may be larger. _______________  Echocardiogram 11/22/2021: Impressions:  1. Left ventricular ejection fraction, by estimation, is 55 to 60%. The  left ventricle has normal function. The left ventricle has no regional  wall motion abnormalities. Left ventricular diastolic function could not  be evaluated.   2. Right ventricular systolic function is normal. The right ventricular  size is normal. There is normal pulmonary artery systolic pressure. The  estimated right ventricular systolic pressure is 18.2 mmHg.   3. The mitral valve is grossly normal. Trivial mitral valve  regurgitation. No evidence of mitral stenosis.   4. The aortic valve is grossly normal. Aortic valve  regurgitation is not  visualized. No aortic stenosis is  present.   5. The inferior vena cava is normal in size with greater than 50%  respiratory variability, suggesting right atrial pressure of 3 mmHg. _______________  Carotid Ultrasound 11/22/2021: Summary: - Right Carotid: Velocities in the right ICA are consistent with a 1-39%  stenosis.  - Left Carotid: Velocities in the left ICA are consistent with a 40-59%  stenosis.  - Vertebrals:  Bilateral vertebral arteries demonstrate antegrade flow.  - Subclavians: Normal flow hemodynamics were seen in bilateral subclavian   arteries.   EKG:  EKG not ordered today.   Recent Labs: 11/22/2021: Magnesium 1.9 03/14/2022: ALT 11 03/16/2022: BUN 16; Creatinine, Ser 0.97; Hemoglobin 10.8; Platelets 323; Potassium 4.1; Sodium 137  Recent Lipid Panel    Component Value Date/Time   CHOL 139 03/15/2022 0241   CHOL 168 04/01/2018 0000   TRIG 76 03/15/2022 0241   HDL 50 03/15/2022 0241   HDL 54 04/01/2018 0000   CHOLHDL 2.8 03/15/2022 0241   VLDL 15 03/15/2022 0241   LDLCALC 74 03/15/2022 0241   LDLCALC 92 04/01/2018 0000    Physical Exam:    Vital Signs: BP (!) 166/80   Pulse 80   Ht 5\' 5"  (1.651 m)   Wt 94 lb (42.6 kg)   SpO2 97%   BMI 15.64 kg/m     Wt Readings from Last 3 Encounters:  06/29/22 94 lb (42.6 kg)  04/12/22 94 lb (42.6 kg)  03/14/22 93 lb 14.7 oz (42.6 kg)     General: 66 y.o. thin African-American female in no acute distress. HEENT: Normocephalic and atraumatic. Sclera clear.  Neck: Supple. No carotid bruits. No JVD. Heart: RRR. Distinct S1 and S2. No murmurs, gallops, or rubs. Radial  Lungs: No increased work of breathing. Clear to ausculation bilaterally. No wheezes, rhonchi, or rales.  Abdomen: Soft, non-distended, and non-tender to palpation.  Extremities: S/p bilateral above knee amputations.  Skin: Warm and dry. Neuro: Alert and oriented x3. No focal deficits. Psych: Normal affect. Responds  appropriately.   Assessment:    1. Coronary artery disease involving native coronary artery of native heart without angina pectoris   2. Dyspnea on exertion   3. Paroxysmal atrial fibrillation (HCC)   4. Tachycardia-bradycardia (HCC)   5. S/P placement of leadless cardiac pacemaker   6. PAD (peripheral artery disease)   7. S/P AKA (above knee amputation) bilateral (HCC)   8. Primary hypertension   9. Hyperlipidemia, unspecified hyperlipidemia type     Plan:    CAD S/p prior stenting to LCX (date of this unknown) and subsequent CABG x3 (LIMA to LAD, SVG to OM, and SVG to distal RCA) in 06/2016. Last ischemic evaluation was a Myoview in 08/2020 which was a poor quality study but showed evidence of prior infarction with associated wall motion abnormality but less evidence to suggest anterolateral ischemia. - No angina. - No aspirin given need for Eliquis. - She was previously on both Crestor and Zetia but she states she is no longer taking these because she was told her cholesterol had improved. Will restart Crestor.  Dyspnea on Exertion Patient reports some intermittent dyspnea with minimal exertion such as transferring in and out of wheelchair. However, dyspnea only last for a couple of seconds and then resolves. - Euvolemic on exam. - Patient thinks this is due to deconditioning since she has been unable to use her prosthetics and has been "stuck" in her wheelchair since the revision of her left amputation in July. I agree, I think dyspnea is  due to deconditioning. She is trying to get Vascular Surgery to refer her back to PT. I agree that she would benefit from PT. Recommended re-discussing with Vascular Surgery or discussing with PCP at scheduled visit next week. Do not think any additional cardiac work-up is necessary at this time.  Paroxysmal Atrial Fibrillation  Maintaining sinus rhythm on exam. - Continue Lopressor 12.5mg  twice daily.  - Continue chronic anticoagulation with  Eliquis 5mg  twice daily.  Tachy-Brady Syndrome S/p leadless pacemaker in 2016. This is no longer functioning. She had battery failure with RRT in in 09/2018 for which new PPM implant was offered but she declined. She also declined removal of current leadless PPM. Thankfully she has done well over since battery failure. - Continue low dose beta-blocker as above.  PAD History of significant PAD ultimately leading to left above knee amputation in 09/2019 and right above knee amputation in 10/2020. She then required revision with conversion to a higher amputation on the left due to a wound on the left amputation site with bone exposure.  - Followed by Vascular Surgery.  Of note, patient states it is very difficult to get to appointments so filled out the CAP/DA forms as requested to help assist with transportation needs.   Hypertension BP elevated in the office. Initially 170/90 and then 166/80 on my personal recheck at the end of visit. However, patient states BP only elevated at medical visits and systolic BP is usually in the 11/2020 at home. - Continue Lopressor 12.5mg  twice daily. - Patient is scheduled to see her PCP next week. Asked her to bring her home machine to that visit to make sure that it is accurate. If accurate, advised her to keep a BP/HR log for 2 weeks and then send this to [id number]. If BP consistently above 130/80, will likely add Amlodipine.  Hyperlipidemia Lipid panel in 02/2022: Total Cholesterol 139, Triglycerides 76, HDL 50, LDL 74. LDL goal <55 given significant CAD and PAD. - Previously on both Crestor and Zetia but she states she is no longer taking these because she was told her cholesterol had improved.  - Will restart Crestor 40mg  daily and then repeat lipid panel and LFTs in 2 months.  Disposition: Follow up in 6 months.   Medication Adjustments/Labs and Tests Ordered: Current medicines are reviewed at length with the patient today.  Concerns regarding medicines are outlined  above.  Orders Placed This Encounter  Procedures   Lipid panel   Hepatic function panel   Meds ordered this encounter  Medications   rosuvastatin (CRESTOR) 40 MG tablet    Sig: Take 1 tablet (40 mg total) by mouth daily.    Dispense:  90 tablet    Refill:  3    Patient Instructions  Medication Instructions:  RESTART Crestor 40 mg daily  *If you need a refill on your cardiac medications before your next appointment, please call your pharmacy*  Lab Work: Your physician recommends that you return for lab work in 2 months:   Fasting Lipid Panel- DO NOT eat or drink past midnight. Okay to have water  Hepatic (Liver) Function  If you have labs (blood work) drawn today and your tests are completely normal, you will receive your results only by: MyChart Message (if you have MyChart) OR A paper copy in the mail If you have any lab test that is abnormal or we need to change your treatment, we will call you to review the results.  Testing/Procedures: NONE ordered at this time  of appointment   Follow-Up: At [facility], you and your health needs are our priority.  As part of our continuing mission to provide you with exceptional heart care, we have created designated Provider Care Teams.  These Care Teams include your primary Cardiologist (physician) and Advanced Practice Providers (APPs -  Physician Assistants and Nurse Practitioners) who all work together to provide you with the care you need, when you need it.   Your next appointment:   6 month(s)  The format for your next appointment:   In Person  Provider:   Olga Millers, MD     Other Instructions DISCUSS with PCP at next appointment about need to restart Plavix (Started after stroke in March), order physical therapy and bring in home BP machine to check accuracy. If not accurate let our office know, if accurate keep BP/HR log for 2 weeks and send to our office   Important Information About Sugar          Signed, Corrin Parker, PA-C  06/29/2022 1:19 PM    [facility]

## 2022-06-29 ENCOUNTER — Ambulatory Visit: Payer: Medicare Other | Attending: Student | Admitting: Student

## 2022-06-29 ENCOUNTER — Encounter: Payer: Self-pay | Admitting: Student

## 2022-06-29 VITALS — BP 166/80 | HR 80 | Ht 65.0 in | Wt 94.0 lb

## 2022-06-29 DIAGNOSIS — Z89611 Acquired absence of right leg above knee: Secondary | ICD-10-CM | POA: Diagnosis not present

## 2022-06-29 DIAGNOSIS — I495 Sick sinus syndrome: Secondary | ICD-10-CM | POA: Diagnosis not present

## 2022-06-29 DIAGNOSIS — Z8673 Personal history of transient ischemic attack (TIA), and cerebral infarction without residual deficits: Secondary | ICD-10-CM

## 2022-06-29 DIAGNOSIS — E785 Hyperlipidemia, unspecified: Secondary | ICD-10-CM | POA: Diagnosis not present

## 2022-06-29 DIAGNOSIS — I739 Peripheral vascular disease, unspecified: Secondary | ICD-10-CM

## 2022-06-29 DIAGNOSIS — Z950 Presence of cardiac pacemaker: Secondary | ICD-10-CM

## 2022-06-29 DIAGNOSIS — I10 Essential (primary) hypertension: Secondary | ICD-10-CM | POA: Diagnosis not present

## 2022-06-29 DIAGNOSIS — I480 Paroxysmal atrial fibrillation: Secondary | ICD-10-CM

## 2022-06-29 DIAGNOSIS — I2510 Atherosclerotic heart disease of native coronary artery without angina pectoris: Secondary | ICD-10-CM

## 2022-06-29 DIAGNOSIS — Z89612 Acquired absence of left leg above knee: Secondary | ICD-10-CM | POA: Diagnosis not present

## 2022-06-29 DIAGNOSIS — R0609 Other forms of dyspnea: Secondary | ICD-10-CM

## 2022-06-29 MED ORDER — ROSUVASTATIN CALCIUM 40 MG PO TABS
40.0000 mg | ORAL_TABLET | Freq: Every day | ORAL | 3 refills | Status: DC
Start: 2022-06-29 — End: 2024-01-02

## 2022-06-29 NOTE — Patient Instructions (Addendum)
Medication Instructions:  RESTART Crestor 40 mg daily  *If you need a refill on your cardiac medications before your next appointment, please call your pharmacy*  Lab Work: Your physician recommends that you return for lab work in 2 months:   Fasting Lipid Panel- DO NOT eat or drink past midnight. Okay to have water  Hepatic (Liver) Function  If you have labs (blood work) drawn today and your tests are completely normal, you will receive your results only by: MyChart Message (if you have MyChart) OR A paper copy in the mail If you have any lab test that is abnormal or we need to change your treatment, we will call you to review the results.  Testing/Procedures: NONE ordered at this time of appointment   Follow-Up: At [facility], you and your health needs are our priority.  As part of our continuing mission to provide you with exceptional heart care, we have created designated Provider Care Teams.  These Care Teams include your primary Cardiologist (physician) and Advanced Practice Providers (APPs -  Physician Assistants and Nurse Practitioners) who all work together to provide you with the care you need, when you need it.   Your next appointment:   6 month(s)  The format for your next appointment:   In Person  Provider:   Kirk Ruths, MD     Other Instructions DISCUSS with PCP at next appointment about need to restart Plavix (Started after stroke in March), order physical therapy and bring in home BP machine to check accuracy. If not accurate let our office know, if accurate keep BP/HR log for 2 weeks and send to our office   Important Information About Sugar

## 2022-07-03 ENCOUNTER — Encounter: Payer: Self-pay | Admitting: Internal Medicine

## 2022-07-03 NOTE — Progress Notes (Unsigned)
Subjective:    Patient ID: Mercedes [name], female    DOB: [birth year], 66 y.o.   MRN: [id number]     HPI Mercedes [name] is here for follow up of her chronic medical problems, including htn, CAD, Afib, DM,    She is taking her medication daily as prescribed.  She did stop the Crestor for a while, but did restart it   SBP 120's, occ 135.  BP typically controlled at home-always tends to be elevated when she goes to the doctors.  She eats a very unhealthy diet-lots of sugar, which we discussed at length.  Medications and allergies reviewed with patient and updated if appropriate.  Current Outpatient Medications on File Prior to Visit  Medication Sig Dispense Refill   acetaminophen (TYLENOL) 500 MG tablet Take 500-1,000 mg by mouth every 8 (eight) hours as needed for mild pain or headache.     ELIQUIS 5 MG TABS tablet TAKE 1 TABLET BY MOUTH TWICE  DAILY 180 tablet 3   metoprolol tartrate (LOPRESSOR) 25 MG tablet Take 0.5 tablets (12.5 mg total) by mouth 2 (two) times daily. 180 tablet 1   nitroGLYCERIN (NITROSTAT) 0.4 MG SL tablet Place 1 tablet (0.4 mg total) under the tongue every 5 (five) minutes as needed for chest pain. 25 tablet 11   rosuvastatin (CRESTOR) 40 MG tablet Take 1 tablet (40 mg total) by mouth daily. 90 tablet 3   No current facility-administered medications on file prior to visit.     Review of Systems  Constitutional:  Negative for chills and fever.  Respiratory:  Negative for cough, shortness of breath and wheezing.   Cardiovascular:  Negative for chest pain and palpitations.  Neurological:  Negative for light-headedness and headaches.  Psychiatric/Behavioral:  Positive for sleep disturbance. Negative for dysphoric mood. The patient is not nervous/anxious.        Objective:   Vitals:   07/05/22 1039  BP: (!) 146/82  Pulse: 80  Temp: 98.7 F (37.1 C)  SpO2: 97%   BP Readings from Last 3 Encounters:  07/05/22 (!) 146/82  06/29/22 (!) 166/80   04/12/22 (!) 156/85   Wt Readings from Last 3 Encounters:  06/29/22 94 lb (42.6 kg)  04/12/22 94 lb (42.6 kg)  03/14/22 93 lb 14.7 oz (42.6 kg)   Body mass index is 15.64 kg/m.    Physical Exam Constitutional:      General: She is not in acute distress.    Appearance: Normal appearance.  HENT:     Head: Normocephalic and atraumatic.  Eyes:     Conjunctiva/sclera: Conjunctivae normal.  Cardiovascular:     Rate and Rhythm: Normal rate and regular rhythm.     Heart sounds: Normal heart sounds. No murmur heard. Pulmonary:     Effort: Pulmonary effort is normal. No respiratory distress.     Breath sounds: Normal breath sounds. No wheezing.  Musculoskeletal:     Cervical back: Neck supple.  Lymphadenopathy:     Cervical: No cervical adenopathy.  Skin:    General: Skin is warm and dry.     Findings: No rash.  Neurological:     Mental Status: She is alert. Mental status is at baseline.  Psychiatric:        Mood and Affect: Mood normal.        Behavior: Behavior normal.        Lab Results  Component Value Date   WBC 5.7 03/16/2022   HGB 10.8 (L) 03/16/2022  HCT 34.3 (L) 03/16/2022   PLT 323 03/16/2022   GLUCOSE 123 (H) 03/16/2022   CHOL 139 03/15/2022   TRIG 76 03/15/2022   HDL 50 03/15/2022   LDLCALC 74 03/15/2022   ALT 11 03/14/2022   AST 24 03/14/2022   NA 137 03/16/2022   K 4.1 03/16/2022   CL 107 03/16/2022   CREATININE 0.97 03/16/2022   BUN 16 03/16/2022   CO2 24 03/16/2022   TSH 0.75 01/31/2019   INR 1.1 03/14/2022   HGBA1C 6.2 (H) 11/22/2021     Assessment & Plan:    See Problem List for Assessment and Plan of chronic medical problems.

## 2022-07-03 NOTE — Patient Instructions (Addendum)
      Blood work was ordered.   The lab is on the first floor.    Medications changes include :   None     Return in about 6 months (around 01/03/2023) for Physical Exam.

## 2022-07-05 ENCOUNTER — Ambulatory Visit (INDEPENDENT_AMBULATORY_CARE_PROVIDER_SITE_OTHER): Payer: Medicare Other | Admitting: Internal Medicine

## 2022-07-05 VITALS — BP 134/80 | HR 80 | Temp 98.7°F | Ht 65.0 in

## 2022-07-05 DIAGNOSIS — I6930 Unspecified sequelae of cerebral infarction: Secondary | ICD-10-CM

## 2022-07-05 DIAGNOSIS — Z9861 Coronary angioplasty status: Secondary | ICD-10-CM | POA: Diagnosis not present

## 2022-07-05 DIAGNOSIS — E0590 Thyrotoxicosis, unspecified without thyrotoxic crisis or storm: Secondary | ICD-10-CM

## 2022-07-05 DIAGNOSIS — I10 Essential (primary) hypertension: Secondary | ICD-10-CM | POA: Diagnosis not present

## 2022-07-05 DIAGNOSIS — I2510 Atherosclerotic heart disease of native coronary artery without angina pectoris: Secondary | ICD-10-CM | POA: Diagnosis not present

## 2022-07-05 DIAGNOSIS — E785 Hyperlipidemia, unspecified: Secondary | ICD-10-CM | POA: Diagnosis not present

## 2022-07-05 DIAGNOSIS — E1159 Type 2 diabetes mellitus with other circulatory complications: Secondary | ICD-10-CM

## 2022-07-05 DIAGNOSIS — I480 Paroxysmal atrial fibrillation: Secondary | ICD-10-CM

## 2022-07-05 DIAGNOSIS — F3289 Other specified depressive episodes: Secondary | ICD-10-CM

## 2022-07-05 LAB — LIPID PANEL
Cholesterol: 160 mg/dL (ref 0–200)
HDL: 52.9 mg/dL (ref >39.00)
LDL Cholesterol: 87 mg/dL (ref 0–99)
NonHDL: 106.69
Total CHOL/HDL Ratio: 3
Triglycerides: 99 mg/dL (ref 0.0–149.0)
VLDL: 19.8 mg/dL (ref 0.0–40.0)

## 2022-07-05 LAB — COMPREHENSIVE METABOLIC PANEL
ALT: 9 U/L (ref 0–35)
AST: 17 U/L (ref 0–37)
Albumin: 3.6 g/dL (ref 3.5–5.2)
Alkaline Phosphatase: 105 U/L (ref 39–117)
BUN: 11 mg/dL (ref 6–23)
CO2: 26 mEq/L (ref 19–32)
Calcium: 9.4 mg/dL (ref 8.4–10.5)
Chloride: 103 mEq/L (ref 96–112)
Creatinine, Ser: 0.71 mg/dL (ref 0.40–1.20)
GFR: 87.35 mL/min (ref >60.00)
Glucose, Bld: 87 mg/dL (ref 70–99)
Potassium: 4.2 mEq/L (ref 3.5–5.1)
Sodium: 133 mEq/L — ABNORMAL LOW (ref 135–145)
Total Bilirubin: 0.4 mg/dL (ref 0.2–1.2)
Total Protein: 7.9 g/dL (ref 6.0–8.3)

## 2022-07-05 LAB — CBC WITH DIFFERENTIAL/PLATELET
Basophils Absolute: 0.1 10*3/uL (ref 0.0–0.1)
Basophils Relative: 1.4 % (ref 0.0–3.0)
Eosinophils Absolute: 0.2 10*3/uL (ref 0.0–0.7)
Eosinophils Relative: 3.5 % (ref 0.0–5.0)
HCT: 38.9 % (ref 36.0–46.0)
Hemoglobin: 12.5 g/dL (ref 12.0–15.0)
Lymphocytes Relative: 31.3 % (ref 12.0–46.0)
Lymphs Abs: 1.5 10*3/uL (ref 0.7–4.0)
MCHC: 32 g/dL (ref 30.0–36.0)
MCV: 74.7 fl — ABNORMAL LOW (ref 78.0–100.0)
Monocytes Absolute: 0.4 10*3/uL (ref 0.1–1.0)
Monocytes Relative: 8 % (ref 3.0–12.0)
Neutro Abs: 2.6 10*3/uL (ref 1.4–7.7)
Neutrophils Relative %: 55.8 % (ref 43.0–77.0)
Platelets: 359 10*3/uL (ref 150.0–400.0)
RBC: 5.21 Mil/uL — ABNORMAL HIGH (ref 3.87–5.11)
RDW: 17.5 % — ABNORMAL HIGH (ref 11.5–15.5)
WBC: 4.7 10*3/uL (ref 4.0–10.5)

## 2022-07-05 LAB — TSH: TSH: 0.39 u[IU]/mL (ref 0.35–5.50)

## 2022-07-05 LAB — HEMOGLOBIN A1C: Hgb A1c MFr Bld: 6.6 % — ABNORMAL HIGH (ref 4.6–6.5)

## 2022-07-05 NOTE — Assessment & Plan Note (Signed)
Chronic Regular exercise and healthy diet encouraged Check lipid panel  Continue Crestor 40 mg daily 

## 2022-07-05 NOTE — Assessment & Plan Note (Signed)
Chronic Stressed healthy diet Continue rosuvastatin 40 mg daily, Eliquis 5 mg twice daily, metoprolol 12.5 mg twice daily Check CBC, CMP, lipid panel

## 2022-07-05 NOTE — Assessment & Plan Note (Signed)
Chronic Blood pressure well controlled CMP Continue metoprolol 12.5 mg twice daily 

## 2022-07-05 NOTE — Assessment & Plan Note (Signed)
Chronic Following with cardiology Continue Eliquis 5 mg twice daily, metoprolol 12.5 mg twice daily CBC, CMP, TSH

## 2022-07-05 NOTE — Assessment & Plan Note (Signed)
History of CVA secondary to noncompliance with Eliquis Continue Eliquis 5 mg twice daily-she is religious about taking this Continue metoprolol 12.5 mg twice daily, rosuvastatin 40 mg daily Encouraged healthy diet

## 2022-07-05 NOTE — Assessment & Plan Note (Addendum)
Chronic Reviewed that she is a diabetic-she seemed to have forgotten this somehow Diet controlled Check A1c, urine microalbumin Encouraged diabetic diet, regular exercise.  She does not eat healthy at all-lots of sugars, soda, carbs.  Discussed how she should be eating not just for her sugars but for other reasons

## 2022-07-11 ENCOUNTER — Telehealth: Payer: Self-pay | Admitting: Internal Medicine

## 2022-07-11 ENCOUNTER — Encounter: Payer: Medicare Other | Admitting: Rehabilitation

## 2022-07-11 NOTE — Telephone Encounter (Signed)
Left message for patient to call back to schedule Medicare Annual Wellness Visit   Last AWV  07/16/19  Please schedule at anytime with [facility] Advisor if patient calls the office back.      Any questions, please call me at [phone]

## 2022-07-12 ENCOUNTER — Encounter: Payer: Self-pay | Admitting: Rehabilitation

## 2022-07-12 ENCOUNTER — Ambulatory Visit: Payer: Medicare Other | Admitting: Rehabilitation

## 2022-07-12 DIAGNOSIS — M6281 Muscle weakness (generalized): Secondary | ICD-10-CM

## 2022-07-12 NOTE — Therapy (Signed)
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  Note she is wearing her liners to build up tolerance and is up to wearing them most of her awake hours without skin issues.  PT recommend continue this as long as skin is intact.  Rescheduled her for Dec with this PT.    Harriet Butte, PT, MPT [facility] [address] [place], [place], [address] Phone: [phone]   Fax:  [phone] 07/12/22, 9:56 AM

## 2022-07-18 ENCOUNTER — Encounter: Payer: Self-pay | Admitting: Cardiology

## 2022-07-18 NOTE — Telephone Encounter (Signed)
Pt states she had some CAP/DA filled out by our office however she was told they were incomplete pages 2-4, some boxes were unchecked, filled out by [place], [place].

## 2022-07-19 ENCOUNTER — Telehealth: Payer: Self-pay | Admitting: Cardiology

## 2022-07-19 NOTE — Progress Notes (Signed)
Called pt, discussed "form", etc. Call health and human services at [phone].

## 2022-07-19 NOTE — Telephone Encounter (Signed)
Pt is calling in regards to form that needed to be filled out, but states that the ones that were filled out were done incorrectly and not stamped by a provider. She states they are CAP-DA forms.  Originally filled out by [employer], [place]. She is requesting the original papers to be stamped and resent and a copy sent to her as well.  Requesting call back to discuss.

## 2022-07-19 NOTE — Telephone Encounter (Signed)
Yes ma'am, I remember filling that out for her

## 2022-07-19 NOTE — Telephone Encounter (Signed)
error 

## 2022-07-19 NOTE — Telephone Encounter (Signed)
Called patient, forms are in media tab- she states it is missing the box that states "facility stamp", they do not have verification that the information came from our office.   I advised I would reach out and get these printed, stamped and faxed back in.   Patient also request a copy be sent to her, and notified when completed.

## 2022-07-24 NOTE — Progress Notes (Signed)
See other message

## 2022-07-24 NOTE — Telephone Encounter (Signed)
CALLED THE HEALTH AND HUMAN SERVICES DEPARTMENT AS REQUESTED BY PT, SPOKE WITH MARTY SHE STATES THAT THERE IS NOTING IN THE SYSTEM THAT STATES THAT THE FORMS NEEDS OUR STAMP ON THE FORMS AND THERE IS NOTHING IN THE SYSTEM THAT THE FORMS WERE FILLED OUT INCORRECTLY THEY ARE ALL COMPLETED AND NOTHING FURTHER IS NEEDED FROM [place].  Pt informed of providers result & recommendations. Pt verbalized understanding. No further questions .

## 2022-07-26 DIAGNOSIS — Z89612 Acquired absence of left leg above knee: Secondary | ICD-10-CM | POA: Diagnosis not present

## 2022-08-01 ENCOUNTER — Other Ambulatory Visit: Payer: Self-pay

## 2022-08-01 ENCOUNTER — Ambulatory Visit: Payer: Medicare Other | Attending: Internal Medicine | Admitting: Rehabilitation

## 2022-08-01 ENCOUNTER — Encounter: Payer: Self-pay | Admitting: Rehabilitation

## 2022-08-01 DIAGNOSIS — R2689 Other abnormalities of gait and mobility: Secondary | ICD-10-CM | POA: Insufficient documentation

## 2022-08-01 DIAGNOSIS — R2681 Unsteadiness on feet: Secondary | ICD-10-CM | POA: Diagnosis not present

## 2022-08-01 DIAGNOSIS — M6281 Muscle weakness (generalized): Secondary | ICD-10-CM | POA: Diagnosis not present

## 2022-08-01 NOTE — Therapy (Unsigned)
OUTPATIENT PHYSICAL THERAPY PROSTHETICS EVALUATION   Patient Name: Mercedes [name] MRN: [id number] DOB:01/16/1956, 66 y.o., female Today's Date: 08/01/2022  PCP: Billey Gosling, MD REFERRING PROVIDER: Waynetta Sandy, MD  END OF SESSION:  PT End of Session - 08/01/22 1316     Visit Number 1    Number of Visits 17    Date for PT Re-Evaluation 09/30/22    Authorization Type UHC Medicare/Medicaid    PT Start Time 1316    PT Stop Time 1405    PT Time Calculation (min) 49 min    Activity Tolerance Patient tolerated treatment well    Behavior During Therapy WFL for tasks assessed/performed             Past Medical History:  Diagnosis Date   Abnormal LFTs    a. in the past when on Lipitor   Anemia    as a young woman   Anxiety    Arnold-Chiari malformation, type I ([place])    MRI brain 02/2010   CAD (coronary artery disease)    a. NSTEMI 07/2009: LHC - D2 40%, LAD irreg., EF 50% with apical AK (tako-tsubo CM);  b. Inf STEMI (04/2013): Tx Promus DES to CFX;  c. 08/2013 Lexi CL: No ischemia, dist ant/ap/inf-ap infarct, EF  d. 06/2016 NSTEMI with LM disease: s/p CABG on 07/17/2016 w/ LIMA-LAD, SVG-OM, and SVG-dRCA.   DEPRESSION    Dizziness    ? CVA 01/2010 - carotid dopplers with no ICA stenosis   GERD (gastroesophageal reflux disease)    Headache    History of transmetatarsal amputation of left foot ([place]) 07/17/2019   HYPERLIPIDEMIA    HYPERTENSION    MI (myocardial infarction) ([place])    PAD (peripheral artery disease) ([place])    s/p L fem pop 11/2013 in setting of 1st toe osteo/gangrene   Paroxysmal atrial fibrillation (HCC)    a. anticoagulated with Eliquis 10/2014   Presence of permanent cardiac pacemaker 10/2014   leadless permanent pacemaker   RA (rheumatoid arthritis) ([place])    prior tx by Dr. Ouida Sills   Sjogren's syndrome ([place]) 06/02/2013   pt denies this (12/01/13)   SLE (systemic lupus erythematosus) ([place]) dx 05/2013   follows with rheum Ouida Sills)   Stroke  [facility])    March 2023, weakness in right arm is improving   Tachy-brady syndrome ([place])    a. 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   f/u echo 09/2009: EF 50-55%, mild LVH, mod diast dysfxn, mild apical HK   Wears dentures    Wears glasses    Past Surgical History:  Procedure Laterality Date   ABDOMINAL AORTAGRAM N/A 11/24/2013   Procedure: ABDOMINAL AORTAGRAM WITH BILATERAL RUNOFF WITH POSSIBLE INTERVENTION;  Surgeon: Angelia Mould, MD;  Location: [facility] CATH LAB;  Service: Cardiovascular;  Laterality: N/A;   ABDOMINAL AORTOGRAM W/LOWER EXTREMITY N/A 04/25/2019   Procedure: ABDOMINAL AORTOGRAM W/LOWER EXTREMITY;  Surgeon: Elam Dutch, MD;  Location: [place] CV LAB;  Service: Cardiovascular;  Laterality: N/A;   ABDOMINAL AORTOGRAM W/LOWER EXTREMITY Bilateral 05/15/2019   Procedure: ABDOMINAL AORTOGRAM W/LOWER EXTREMITY;  Surgeon: Serafina Mitchell, MD;  Location: [place] CV LAB;  Service: Cardiovascular;  Laterality: Bilateral;   ABDOMINAL AORTOGRAM W/LOWER EXTREMITY N/A 08/23/2020   Procedure: ABDOMINAL AORTOGRAM W/LOWER EXTREMITY;  Surgeon: Waynetta Sandy, MD;  Location: [place] CV LAB;  Service: Cardiovascular;  Laterality: N/A;   ABDOMINAL AORTOGRAM W/LOWER EXTREMITY N/A 10/29/2020   Procedure: ABDOMINAL AORTOGRAM  W/LOWER EXTREMITY;  Surgeon: Elam Dutch, MD;  Location: [place] CV LAB;  Service: Cardiovascular;  Laterality: N/A;   AMPUTATION Left 12/02/2013   Procedure: LEFT 1ST TOE AMPUTATION;  Surgeon: Elam Dutch, MD;  Location: [place];  Service: Vascular;  Laterality: Left;   AMPUTATION Left 07/16/2019   Procedure: LEFT TRANSMETATARSAL AMPUTATION;  Surgeon: Newt Minion, MD;  Location: [place];  Service: Orthopedics;  Laterality: Left;   AMPUTATION Left 10/01/2019   Procedure: LEFT AMPUTATION ABOVE KNEE;  Surgeon: Elam Dutch, MD;  Location: [place];  Service: Vascular;  Laterality: Left;   AMPUTATION  Right 08/31/2020   Procedure: Right first toe amputation;  Surgeon: Elam Dutch, MD;  Location: [place];  Service: Vascular;  Laterality: Right;   AMPUTATION Right 11/23/2020   Procedure: RIGHT AMPUTATION ABOVE KNEE;  Surgeon: Elam Dutch, MD;  Location: [place];  Service: Vascular;  Laterality: Right;   AMPUTATION Left 03/14/2022   Procedure: REVISION LEFT ABOVE KNEE AMPUTATION;  Surgeon: Waynetta Sandy, MD;  Location: [place];  Service: Vascular;  Laterality: Left;   APPLICATION OF WOUND VAC Left 11/03/2019   Procedure: Application Of Wound Vac, left lower extremity;  Surgeon: Elam Dutch, MD;  Location: [place];  Service: Vascular;  Laterality: Left;   CARDIAC CATHETERIZATION N/A 07/12/2016   Procedure: Left Heart Cath and Coronary Angiography;  Surgeon: Troy Sine, MD;  Location: [place] CV LAB;  Service: Cardiovascular;  Laterality: N/A;   CARDIAC CATHETERIZATION N/A 07/14/2016   Procedure: Intravascular Ultrasound/IVUS;  Surgeon: Nelva Bush, MD;  Location: [place] CV LAB;  Service: Cardiovascular;  Laterality: N/A;   COLONOSCOPY     CORONARY ANGIOPLASTY  04/2013   CORONARY ARTERY BYPASS GRAFT N/A 07/17/2016   Procedure: CORONARY ARTERY BYPASS GRAFTING (CABG)x3 using right greater saphenous vein harvested endoscopiclly and left internal mammary artery;  Surgeon: Ivin Poot, MD;  Location: [place];  Service: Open Heart Surgery;  Laterality: N/A;   ENDARTERECTOMY FEMORAL Left 05/12/2019   Procedure: LEFT Femoral Endarterectomy;  Surgeon: Elam Dutch, MD;  Location: [place];  Service: Vascular;  Laterality: Left;   FEMORAL-POPLITEAL BYPASS GRAFT Left 12/02/2013   Procedure:  LEFT FEMORAL-BELOW KNEE POPLITEAL ARTERY BYPASS GRAFT;  Surgeon: Elam Dutch, MD;  Location: [place];  Service: Vascular;  Laterality: Left;   FEMORAL-POPLITEAL BYPASS GRAFT Left 09/25/2019   Procedure: THROMBECTOMY and PROPATEN BYPASS  FEMORAL TO BELOW KNEE POPLITEAL ARTERY BYPASS  GRAFT LEFT LEG;  Surgeon: [name], MD;  Location: [place];  Service: Vascular;  Laterality: Left;   FEMORAL-POPLITEAL BYPASS GRAFT Right 08/24/2020   Procedure: RIGHT FEMORAL-BELOW KNEE POPLITEAL ARTERY BYPASS USING GORE PROPATEN GRAFT;  Surgeon: Waynetta Sandy, MD;  Location: [place];  Service: Vascular;  Laterality: Right;   FEMORAL-POPLITEAL BYPASS GRAFT Right 11/01/2020   Procedure: Thrombectomy and Revision of Right Femoral to below knee Bypass with Interpostional graft to Tibial / peroneal Trunk and Endarterectomy Tibial Letitia Caul Trunk.;  Surgeon: Elam Dutch, MD;  Location[facility] OR;  Service: Vascular;  Laterality: Right;   INTRAOPERATIVE ARTERIOGRAM Right 11/01/2020   Procedure: ANTEGRADE RIGHT LOWER EXTREMITY INTRA OPERATIVE ARTERIOGRAM;  Surgeon: Elam Dutch, MD;  Location: [facility] OR;  Service: Vascular;  Laterality: Right;   LEFT HEART CATHETERIZATION WITH CORONARY ANGIOGRAM N/A 05/19/2013   Procedure: LEFT HEART CATHETERIZATION WITH CORONARY ANGIOGRAM;  Surgeon: Blane Ohara, MD;  Location: [facility] CATH LAB;  Service: Cardiovascular;  Laterality: N/A;  LEFT HEART CATHETERIZATION WITH CORONARY ANGIOGRAM N/A 10/28/2014   Procedure: LEFT HEART CATHETERIZATION WITH CORONARY ANGIOGRAM;  Surgeon: Iran Ouch, MD;  Location: [facility] CATH LAB;  Service: Cardiovascular;  Laterality: N/A;   LOWER EXTREMITY ANGIOGRAM Left 05/12/2019   Procedure: Lower Extremity Angiogram;  Surgeon: Sherren Kerns, MD;  Location: [place] OR;  Service: Vascular;  Laterality: Left;   PATCH ANGIOPLASTY Left 05/12/2019   Procedure: Left Femoral Patch Angioplasty USING CADAVERIC SAPHENOUS VEIN;  Surgeon: Sherren Kerns, MD;  Location: [facility] OR;  Service: Vascular;  Laterality: Left;   PERCUTANEOUS CORONARY STENT INTERVENTION (PCI-S)  05/19/2013   Procedure: PERCUTANEOUS CORONARY STENT INTERVENTION (PCI-S);  Surgeon: Micheline Chapman, MD;  Location: [facility] CATH LAB;  Service: Cardiovascular;;   PERIPHERAL VASCULAR  INTERVENTION Right 10/29/2020   Procedure: PERIPHERAL VASCULAR INTERVENTION;  Surgeon: Sherren Kerns, MD;  Location: [facility] INVASIVE CV LAB;  Service: Cardiovascular;  Laterality: Right;  common iliac   PERMANENT PACEMAKER INSERTION N/A 11/04/2014   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   TEE WITHOUT CARDIOVERSION N/A 07/17/2016   Procedure: TRANSESOPHAGEAL ECHOCARDIOGRAM (TEE);  Surgeon: Kerin Perna, MD;  Location: [facility] OR;  Service: Open Heart Surgery;  Laterality: N/A;   THROMBECTOMY FEMORAL ARTERY Left 05/12/2019   Procedure: Left Ilio-Femoral Artery and Femoral-popliteal Graft Thrombectomy;  Surgeon: Sherren Kerns, MD;  Location: [facility] OR;  Service: Vascular;  Laterality: Left;   THROMBECTOMY OF BYPASS GRAFT FEMORAL- POPLITEAL ARTERY Right 11/01/2020   Procedure: POSSIBLE THROMBOLYSIS VERSUS OPEN THROMBECTOMY AND REVSION OF RIGHT FEMORAL-POPLITEAL ARTERY BYPASS;  Surgeon: Sherren Kerns, MD;  Location: [facility] OR;  Service: Vascular;  Laterality: Right;   TUBAL LIGATION     VISCERAL ANGIOGRAPHY N/A 04/25/2019   Procedure: MESENTRIC  ANGIOGRAPHY;  Surgeon: Sherren Kerns, MD;  Location: [facility] INVASIVE CV LAB;  Service: Cardiovascular;  Laterality: N/A;   WOUND DEBRIDEMENT Left 11/03/2019   Procedure: Debridement Wound of left above the knee amputation;  Surgeon: Sherren Kerns, MD;  Location: [facility] OR;  Service: Vascular;  Laterality: Left;   Patient Active Problem List   Diagnosis Date Noted   History of CVA with residual deficit 12/14/2021   S/P AKA (above knee amputation) bilateral (HCC) 12/14/2021   Ulceration of stump of above knee amputation (HCC) 11/09/2021   S/P AKA (above knee amputation), right (HCC), 11/23/20 12/14/2020   Ischemia of extremity 10/29/2020   S/P AKA (above knee amputation), left (HCC), 10/01/19 10/20/2019   Femoral-popliteal bypass graft occlusion, left (HCC) 09/25/2019   Peripheral arterial disease (HCC) 09/25/2019   Renal infarct (HCC) 02/01/2019    Diabetes (HCC) 04/01/2017   Hyperthyroidism 02/21/2017   Coronary artery disease involving native heart without angina pectoris    Hx of CABG 07/17/2016   Paroxysmal atrial fibrillation (HCC) 07/12/2016   Phantom limb pain-Left great toe 04/29/2015   Seizure (HCC)    Atrial flutter with rapid ventricular response vs. SVT 10/27/2014    Class: Acute   Atherosclerosis of native artery of left lower extremity with rest pain (HCC) 12/18/2013   PAD (peripheral artery disease) (HCC) 12/02/2013   SLE (systemic lupus erythematosus) (HCC)    CAD - S/P Takotsubo MI in 2000, CFX DES Sept 2014 05/25/2013   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   Tachycardia-bradycardia (HCC) 03/09/2010   PVD- s/p Lt [facility] April 2015 10/29/2009   Dyslipidemia 08/26/2009   Essential hypertension 08/26/2009   CORONARY ATHEROSCLEROSIS NATIVE CORONARY ARTERY 08/26/2009   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   Rheumatoid  arthritis ([place]) 08/26/2009    ONSET DATE: Left prosthesis delivered 07/26/22  REFERRING DIAG: PT for right above knee amputation and left mid-femoral amputation  THERAPY DIAG:  Muscle weakness (generalized)  Unsteadiness on feet  Other abnormalities of gait and mobility  Rationale for Evaluation and Treatment: Rehabilitation  SUBJECTIVE:   SUBJECTIVE STATEMENT: Pt reports that she would like to be as independent as possible with her shortened prosthesis.  She reports that she previously had R AKA with full length prosthesis approx 2 years ago but following new L AKA then revision, has 2 shortened prostheses.   Pt accompanied by: self  PERTINENT HISTORY: PMH: RA, PAD, MI, HTN, CAD, hs of CVA  PAIN:  Are you having pain? No  PRECAUTIONS: Fall (Monitor vitals with exertion)   WEIGHT BEARING RESTRICTIONS: No  FALLS: Has patient fallen in last 6 months? No  LIVING ENVIRONMENT: Lives with: lives alone Lives in: House/apartment Home Access: [place] entrance Home layout: One  level Stairs: No Has following equipment at home: Environmental consultant - 2 wheeled, Wheelchair (power), Wheelchair (manual), shower chair, bed side commode, and Grab bars  PLOF: Independent with basic ADLs, Independent with household mobility with device, Independent with community mobility with device, and Independent with homemaking with wheelchair  PATIENT GOALS: "I want to be able to take those 11 steps on the 07/26/22."   OBJECTIVE:   DIAGNOSTIC FINDINGS: n/a  COGNITION: Overall cognitive status: Within functional limits for tasks assessed   SENSATION: WFL  MUSCLE LENGTH: Hip flex in supine: 10 deg from neutral (LLE), 4 deg from neutral (RLE) Thomas test: Right -29 deg; Left -29 deg    POSTURE: No Significant postural limitations  LOWER EXTREMITY ROM:  See above for hip measurements  LOWER EXTREMITY MMT:  MMT Right eval Left eval  Hip flexion 4/5 4/5  Hip extension 4/5 4/5  Hip abduction 4/5 4/5  Hip adduction 4/5 4/5  Hip internal rotation    Hip external rotation    Knee flexion    Knee extension    Ankle dorsiflexion    Ankle plantarflexion    Ankle inversion    Ankle eversion    (Blank rows = not tested) All tested on mat in supine   BED MOBILITY:  Sit to supine SBA Supine to sit SBA  TRANSFERS: Sit to stand: Min A Stand to sit: Min A Ant/Post: Pt able to perform ant transfer at S level from w/c to mat    GAIT: Gait pattern: step to pattern, step through pattern, decreased stride length, and trunk flexed Distance walked: 16' then another 18' Assistive device utilized: Environmental consultant - 2 wheeled (utilized standard walker but had her place hands on lower bar to be appropriate height, gray bars at 18.5") Level of assistance: Min A and +2 initially for assist posteriorly and another PT assisting with guiding RW appropriately however during second bout she was able to move walker on her own.  Needed some assist with turning w/ cues to turn walker then body to match  walker.  Gait velocity: 0.10 ft/sec Comments: Cues for posture and hand placement.  Had pt scoot to [place] then place hands on walker, then placing prostheses to floor and when getting back to chair had her push from walker and lift buttocks to chair.      CARDIOVASCULAR RESPONSE:  Post-activity vitals: Following walking BP:  HR: 104 SpO2: 100 Modified Borg scale for dyspnea:   CURRENT PROSTHETIC WEAR ASSESSMENT: Patient is independent with: proper wear schedule/adjustment and  proper weight-bearing schedule/adjustment Patient is dependent with: prosthetic cleaning, ply sock cleaning, and correct ply sock adjustment Donning prosthesis: Modified independence Doffing prosthesis: Modified independence Prosthetic wear tolerance: Is wearing most of awake hours Prosthetic weight bearing tolerance: has only stood once at home with B stubbies Edema: none noted Residual limb condition: good condition  Prosthetic description: B stubbies K code/activity level with prosthetic use: Level 2    TODAY'S TREATMENT:                                                                                                                                         DATE:   TREATMENT:  PATIENT EDUCATED ON FOLLOWING PROSTHETIC CARE: Prosthetic wear tolerance: Continue most of awake hours as long as skin is intact without issues/pain Prosthetic weight bearing tolerance: 1 minutes Other education  Skin check, Prosthetic cleaning, Ply sock cleaning, Correct ply sock adjustment, Proper wear schedule/adjustment, and Proper weight-bearing schedule/adjustment  PATIENT EDUCATION: Education details: Evaluation results, POC, goals Person educated: Patient Education method: Explanation Education comprehension: verbalized understanding and needs further education  HOME EXERCISE PROGRAM: None initiated yet  ASSESSMENT:  CLINICAL IMPRESSION: Patient is a 66 y.o. female who was seen today for physical therapy evaluation  and treatment for B AKAs with shortened prostheses.  She has history of CAD s/p prior stenting to LCX (date of this unknown) and subsequent CABG x3 (LIMA to LAD, SVG to OM, and SVG to distal RCA) in 06/2016, paroxysmal atrial fibrillation on Eliquis, tachy-brady syndrome s/p  leadless PPM in 10/2014 which is now no longer functioning, PAD s/p multiple interventions of bilateral lower extremities including left above knee amputation in 09/2019 and right above knee amputation in 10/2020 with revision of left amputation in 02/2022, bilateral carotid stenosis (left > right), CVA in 3/023, hypertension, hyperlipidemia, GERD, SLE, rheumatoid arthritis, and Sjogren's syndrome.  She presents to therapy today with B shortened prostheses and is able to stand and ambulate up to 25' with min A.  Feel that she will do well with therapy to address functional mobility deficits with goals of functional household ambulation.   OBJECTIVE IMPAIRMENTS: Abnormal gait, decreased activity tolerance, decreased balance, decreased endurance, decreased knowledge of use of DME, decreased mobility, difficulty walking, decreased ROM, decreased strength, impaired perceived functional ability, impaired flexibility, and prosthetic dependency .   ACTIVITY LIMITATIONS: carrying, lifting, bending, standing, squatting, stairs, transfers, and locomotion level  PARTICIPATION LIMITATIONS: meal prep, cleaning, laundry, shopping, and community activity  PERSONAL FACTORS: 3+ comorbidities: see above  are also affecting patient's functional outcome.   REHAB POTENTIAL: Good  CLINICAL DECISION MAKING: Evolving/moderate complexity  EVALUATION COMPLEXITY: Moderate   GOALS: Goals reviewed with patient? Yes  SHORT TERM GOALS: Target date: 08/30/2022  Pt will be independent in core strengthening and hip flexibility HEP.   Baseline: None initiated at this time Goal status: INITIAL  2.  Pt will perform sit<>stand to appropriate  height walker with  correct technique at S level.  Baseline: min A Goal status: INITIAL  3.  Pt will report independent donning/doffing of B prostheses and tolerating wear of them 90% of awake hours.  Baseline: wearing several hours a day  Goal status: INITIAL  4.  Pt will ambulate x 25' with appropriate height RW (hands at 18.5") at [facility].   Baseline: +2 for assist with chair Goal status: INITIAL  5.  Pt will perform safe turning with RW at [facility] level in order to indicate safety at home.  Baseline:  Goal status: INITIAL  6.  Pt will tolerate standing without UE support (hips supported) x 5 mins in order to indicate improved endurance and core strength.  Baseline:  Goal status: INITIAL  LONG TERM GOALS: Target date: 09/30/2022  Pt will be independent with final HEP in order to indicate improved core strength and hip flexibility.  Baseline:  Goal status: INITIAL  2.  Pt will improve gait speed from 0.10 ft/sec to at least 0.80 ft/sec in order to indicate improved confidence with gait.  Baseline:  Goal status: INITIAL  3.  Pt will perform sit<>stand transfers at mod I level (with or without AD as appropriate).  Baseline:  Goal status: INITIAL  4.  Pt will ambulate x 50' with RW at mod I level with safe turns/negotiation around obstacles in order to indicate safe household ambulation.  Baseline:  Goal status: INITIAL  5.  Pt will tolerate standing unsupported (hip unsupported also) x 3-5 mins in order to indicate improved core strength and standing tolerance.  Baseline:  Goal status: INITIAL  6.  Will initiate gait with LRAD as able.  Baseline:  Goal status: INITIAL   PLAN:  PT FREQUENCY: 2x/week  PT DURATION: 8 weeks  PLANNED INTERVENTIONS: Therapeutic exercises, Therapeutic activity, Neuromuscular re-education, Balance training, Gait training, Patient/Family education, Self Care, Joint mobilization, Prosthetic training, and DME instructions  PLAN FOR NEXT SESSION: Keep a check on her  vitals. Provide HEP standing against blue mat/standard arm chair against wall with hips on chair/mat moving trunk ant back to midline, posterior back to midline, laterally back to midline each direction without UE support, hip flexibility in thomas test position (glute set in thomas test), sit<>stand to shortened walker (use lower gray bars at 18.5" from floor), pushing back up to chair, gait with shortened walker.  Work on getting peds walker   Cameron Sprang, PT, MPT [facility] [address] [place], [place], [address] Phone: [phone]   Fax:  [phone] 08/01/22, 3:41 PM

## 2022-08-09 ENCOUNTER — Encounter: Payer: Self-pay | Admitting: Rehabilitation

## 2022-08-09 ENCOUNTER — Ambulatory Visit: Payer: Medicare Other | Admitting: Rehabilitation

## 2022-08-09 DIAGNOSIS — R2681 Unsteadiness on feet: Secondary | ICD-10-CM

## 2022-08-09 DIAGNOSIS — M6281 Muscle weakness (generalized): Secondary | ICD-10-CM | POA: Diagnosis not present

## 2022-08-09 DIAGNOSIS — R2689 Other abnormalities of gait and mobility: Secondary | ICD-10-CM

## 2022-08-09 NOTE — Therapy (Signed)
OUTPATIENT PHYSICAL THERAPY PROSTHETICS TREATMENT    Patient Name: Mercedes [name] MRN: [id number] DOB:1955-09-30, 66 y.o., female Today's Date: 08/09/2022  PCP: Billey Gosling, MD REFERRING PROVIDER: Waynetta Sandy, MD  END OF SESSION:  PT End of Session - 08/09/22 1251     Visit Number 2    Number of Visits 17    Date for PT Re-Evaluation 09/30/22    Authorization Type UHC Medicare/Medicaid    PT Start Time 1016    PT Stop Time 1101    PT Time Calculation (min) 45 min    Activity Tolerance Patient tolerated treatment well    Behavior During Therapy [facility] for tasks assessed/performed             Past Medical History:  Diagnosis Date   Abnormal LFTs    a. in the past when on Lipitor   Anemia    as a young woman   Anxiety    Arnold-Chiari malformation, type I ([place])    MRI brain 02/2010   CAD (coronary artery disease)    a. NSTEMI 07/2009: LHC - D2 40%, LAD irreg., EF 50% with apical AK (tako-tsubo CM);  b. Inf STEMI (04/2013): Tx Promus DES to CFX;  c. 08/2013 Lexi CL: No ischemia, dist ant/ap/inf-ap infarct, EF  d. 06/2016 NSTEMI with LM disease: s/p CABG on 07/17/2016 w/ LIMA-LAD, SVG-OM, and SVG-dRCA.   DEPRESSION    Dizziness    ? CVA 01/2010 - carotid dopplers with no ICA stenosis   GERD (gastroesophageal reflux disease)    Headache    History of transmetatarsal amputation of left foot ([place]) 07/17/2019   HYPERLIPIDEMIA    HYPERTENSION    MI (myocardial infarction) ([place])    PAD (peripheral artery disease) ([place])    s/p L fem pop 11/2013 in setting of 1st toe osteo/gangrene   Paroxysmal atrial fibrillation (HCC)    a. anticoagulated with Eliquis 10/2014   Presence of permanent cardiac pacemaker 10/2014   leadless permanent pacemaker   RA (rheumatoid arthritis) ([place])    prior tx by Dr. Ouida Sills   Sjogren's syndrome ([place]) 06/02/2013   pt denies this (12/01/13)   SLE (systemic lupus erythematosus) ([place]) dx 05/2013   follows with rheum Ouida Sills)   Stroke  [facility])    March 2023, weakness in right arm is improving   Tachy-brady syndrome ([place])    a. 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   f/u echo 09/2009: EF 50-55%, mild LVH, mod diast dysfxn, mild apical HK   Wears dentures    Wears glasses    Past Surgical History:  Procedure Laterality Date   ABDOMINAL AORTAGRAM N/A 11/24/2013   Procedure: ABDOMINAL AORTAGRAM WITH BILATERAL RUNOFF WITH POSSIBLE INTERVENTION;  Surgeon: Angelia Mould, MD;  Location: [facility] CATH LAB;  Service: Cardiovascular;  Laterality: N/A;   ABDOMINAL AORTOGRAM W/LOWER EXTREMITY N/A 04/25/2019   Procedure: ABDOMINAL AORTOGRAM W/LOWER EXTREMITY;  Surgeon: Elam Dutch, MD;  Location: [place] CV LAB;  Service: Cardiovascular;  Laterality: N/A;   ABDOMINAL AORTOGRAM W/LOWER EXTREMITY Bilateral 05/15/2019   Procedure: ABDOMINAL AORTOGRAM W/LOWER EXTREMITY;  Surgeon: Serafina Mitchell, MD;  Location: [place] CV LAB;  Service: Cardiovascular;  Laterality: Bilateral;   ABDOMINAL AORTOGRAM W/LOWER EXTREMITY N/A 08/23/2020   Procedure: ABDOMINAL AORTOGRAM W/LOWER EXTREMITY;  Surgeon: Waynetta Sandy, MD;  Location: [place] CV LAB;  Service: Cardiovascular;  Laterality: N/A;   ABDOMINAL AORTOGRAM W/LOWER EXTREMITY N/A 10/29/2020   Procedure: ABDOMINAL  AORTOGRAM W/LOWER EXTREMITY;  Surgeon: Elam Dutch, MD;  Location: [place] CV LAB;  Service: Cardiovascular;  Laterality: N/A;   AMPUTATION Left 12/02/2013   Procedure: LEFT 1ST TOE AMPUTATION;  Surgeon: Elam Dutch, MD;  Location: [place];  Service: Vascular;  Laterality: Left;   AMPUTATION Left 07/16/2019   Procedure: LEFT TRANSMETATARSAL AMPUTATION;  Surgeon: Newt Minion, MD;  Location: [place];  Service: Orthopedics;  Laterality: Left;   AMPUTATION Left 10/01/2019   Procedure: LEFT AMPUTATION ABOVE KNEE;  Surgeon: Elam Dutch, MD;  Location: [place];  Service: Vascular;  Laterality: Left;   AMPUTATION  Right 08/31/2020   Procedure: Right first toe amputation;  Surgeon: Elam Dutch, MD;  Location: [place];  Service: Vascular;  Laterality: Right;   AMPUTATION Right 11/23/2020   Procedure: RIGHT AMPUTATION ABOVE KNEE;  Surgeon: Elam Dutch, MD;  Location: [place];  Service: Vascular;  Laterality: Right;   AMPUTATION Left 03/14/2022   Procedure: REVISION LEFT ABOVE KNEE AMPUTATION;  Surgeon: Waynetta Sandy, MD;  Location: [place];  Service: Vascular;  Laterality: Left;   APPLICATION OF WOUND VAC Left 11/03/2019   Procedure: Application Of Wound Vac, left lower extremity;  Surgeon: Elam Dutch, MD;  Location: [place];  Service: Vascular;  Laterality: Left;   CARDIAC CATHETERIZATION N/A 07/12/2016   Procedure: Left Heart Cath and Coronary Angiography;  Surgeon: Troy Sine, MD;  Location: [place] CV LAB;  Service: Cardiovascular;  Laterality: N/A;   CARDIAC CATHETERIZATION N/A 07/14/2016   Procedure: Intravascular Ultrasound/IVUS;  Surgeon: Nelva Bush, MD;  Location: [place] CV LAB;  Service: Cardiovascular;  Laterality: N/A;   COLONOSCOPY     CORONARY ANGIOPLASTY  04/2013   CORONARY ARTERY BYPASS GRAFT N/A 07/17/2016   Procedure: CORONARY ARTERY BYPASS GRAFTING (CABG)x3 using right greater saphenous vein harvested endoscopiclly and left internal mammary artery;  Surgeon: Ivin Poot, MD;  Location: [place];  Service: Open Heart Surgery;  Laterality: N/A;   ENDARTERECTOMY FEMORAL Left 05/12/2019   Procedure: LEFT Femoral Endarterectomy;  Surgeon: Elam Dutch, MD;  Location: [place];  Service: Vascular;  Laterality: Left;   FEMORAL-POPLITEAL BYPASS GRAFT Left 12/02/2013   Procedure:  LEFT FEMORAL-BELOW KNEE POPLITEAL ARTERY BYPASS GRAFT;  Surgeon: Elam Dutch, MD;  Location: [place];  Service: Vascular;  Laterality: Left;   FEMORAL-POPLITEAL BYPASS GRAFT Left 09/25/2019   Procedure: THROMBECTOMY and PROPATEN BYPASS  FEMORAL TO BELOW KNEE POPLITEAL ARTERY BYPASS  GRAFT LEFT LEG;  Surgeon: [name], MD;  Location: [place];  Service: Vascular;  Laterality: Left;   FEMORAL-POPLITEAL BYPASS GRAFT Right 08/24/2020   Procedure: RIGHT FEMORAL-BELOW KNEE POPLITEAL ARTERY BYPASS USING GORE PROPATEN GRAFT;  Surgeon: Waynetta Sandy, MD;  Location: [place];  Service: Vascular;  Laterality: Right;   FEMORAL-POPLITEAL BYPASS GRAFT Right 11/01/2020   Procedure: Thrombectomy and Revision of Right Femoral to below knee Bypass with Interpostional graft to Tibial / peroneal Trunk and Endarterectomy Tibial Letitia Caul Trunk.;  Surgeon: Elam Dutch, MD;  Location: [facility] OR;  Service: Vascular;  Laterality: Right;   INTRAOPERATIVE ARTERIOGRAM Right 11/01/2020   Procedure: ANTEGRADE RIGHT LOWER EXTREMITY INTRA OPERATIVE ARTERIOGRAM;  Surgeon: Elam Dutch, MD;  Location: [facility] OR;  Service: Vascular;  Laterality: Right;   LEFT HEART CATHETERIZATION WITH CORONARY ANGIOGRAM N/A 05/19/2013   Procedure: LEFT HEART CATHETERIZATION WITH CORONARY ANGIOGRAM;  Surgeon: Blane Ohara, MD;  Location: [facility] CATH LAB;  Service: Cardiovascular;  Laterality: N/A;  LEFT HEART CATHETERIZATION WITH CORONARY ANGIOGRAM N/A 10/28/2014   Procedure: LEFT HEART CATHETERIZATION WITH CORONARY ANGIOGRAM;  Surgeon: Iran Ouch, MD;  Location: [facility] CATH LAB;  Service: Cardiovascular;  Laterality: N/A;   LOWER EXTREMITY ANGIOGRAM Left 05/12/2019   Procedure: Lower Extremity Angiogram;  Surgeon: Sherren Kerns, MD;  Location: [place] OR;  Service: Vascular;  Laterality: Left;   PATCH ANGIOPLASTY Left 05/12/2019   Procedure: Left Femoral Patch Angioplasty USING CADAVERIC SAPHENOUS VEIN;  Surgeon: Sherren Kerns, MD;  Location: [facility] OR;  Service: Vascular;  Laterality: Left;   PERCUTANEOUS CORONARY STENT INTERVENTION (PCI-S)  05/19/2013   Procedure: PERCUTANEOUS CORONARY STENT INTERVENTION (PCI-S);  Surgeon: Micheline Chapman, MD;  Location: [facility] CATH LAB;  Service: Cardiovascular;;   PERIPHERAL VASCULAR  INTERVENTION Right 10/29/2020   Procedure: PERIPHERAL VASCULAR INTERVENTION;  Surgeon: Sherren Kerns, MD;  Location: [facility] INVASIVE CV LAB;  Service: Cardiovascular;  Laterality: Right;  common iliac   PERMANENT PACEMAKER INSERTION N/A 11/04/2014   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   TEE WITHOUT CARDIOVERSION N/A 07/17/2016   Procedure: TRANSESOPHAGEAL ECHOCARDIOGRAM (TEE);  Surgeon: Kerin Perna, MD;  Location: [facility] OR;  Service: Open Heart Surgery;  Laterality: N/A;   THROMBECTOMY FEMORAL ARTERY Left 05/12/2019   Procedure: Left Ilio-Femoral Artery and Femoral-popliteal Graft Thrombectomy;  Surgeon: Sherren Kerns, MD;  Location: [facility] OR;  Service: Vascular;  Laterality: Left;   THROMBECTOMY OF BYPASS GRAFT FEMORAL- POPLITEAL ARTERY Right 11/01/2020   Procedure: POSSIBLE THROMBOLYSIS VERSUS OPEN THROMBECTOMY AND REVSION OF RIGHT FEMORAL-POPLITEAL ARTERY BYPASS;  Surgeon: Sherren Kerns, MD;  Location: [facility] OR;  Service: Vascular;  Laterality: Right;   TUBAL LIGATION     VISCERAL ANGIOGRAPHY N/A 04/25/2019   Procedure: MESENTRIC  ANGIOGRAPHY;  Surgeon: Sherren Kerns, MD;  Location: [facility] INVASIVE CV LAB;  Service: Cardiovascular;  Laterality: N/A;   WOUND DEBRIDEMENT Left 11/03/2019   Procedure: Debridement Wound of left above the knee amputation;  Surgeon: Sherren Kerns, MD;  Location: [facility] OR;  Service: Vascular;  Laterality: Left;   Patient Active Problem List   Diagnosis Date Noted   History of CVA with residual deficit 12/14/2021   S/P AKA (above knee amputation) bilateral (HCC) 12/14/2021   Ulceration of stump of above knee amputation (HCC) 11/09/2021   S/P AKA (above knee amputation), right (HCC), 11/23/20 12/14/2020   Ischemia of extremity 10/29/2020   S/P AKA (above knee amputation), left (HCC), 10/01/19 10/20/2019   Femoral-popliteal bypass graft occlusion, left (HCC) 09/25/2019   Peripheral arterial disease (HCC) 09/25/2019   Renal infarct (HCC) 02/01/2019    Diabetes (HCC) 04/01/2017   Hyperthyroidism 02/21/2017   Coronary artery disease involving native heart without angina pectoris    Hx of CABG 07/17/2016   Paroxysmal atrial fibrillation (HCC) 07/12/2016   Phantom limb pain-Left great toe 04/29/2015   Seizure (HCC)    Atrial flutter with rapid ventricular response vs. SVT 10/27/2014    Class: Acute   Atherosclerosis of native artery of left lower extremity with rest pain (HCC) 12/18/2013   PAD (peripheral artery disease) (HCC) 12/02/2013   SLE (systemic lupus erythematosus) (HCC)    CAD - S/P Takotsubo MI in 2000, CFX DES Sept 2014 05/25/2013   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   Tachycardia-bradycardia (HCC) 03/09/2010   PVD- s/p Lt [facility] April 2015 10/29/2009   Dyslipidemia 08/26/2009   Essential hypertension 08/26/2009   CORONARY ATHEROSCLEROSIS NATIVE CORONARY ARTERY 08/26/2009   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   Rheumatoid  arthritis ([place]) 08/26/2009    ONSET DATE: Left prosthesis delivered 07/26/22  REFERRING DIAG: PT for right above knee amputation and left mid-femoral amputation  THERAPY DIAG:  Muscle weakness (generalized)  Unsteadiness on feet  Other abnormalities of gait and mobility  Rationale for Evaluation and Treatment: Rehabilitation  SUBJECTIVE:   SUBJECTIVE STATEMENT: Pt reports that she would like to be as independent as possible with her shortened prosthesis.  She reports that she previously had R AKA with full length prosthesis approx 2 years ago but following new L AKA then revision, has 2 shortened prostheses.   Pt accompanied by: self  PERTINENT HISTORY: PMH: RA, PAD, MI, HTN, CAD, hs of CVA  PAIN:  Are you having pain? No  PRECAUTIONS: Fall (Monitor vitals with exertion)   WEIGHT BEARING RESTRICTIONS: No  FALLS: Has patient fallen in last 6 months? No   PATIENT GOALS: "I want to be able to take those 11 steps on the 07/26/22."   OBJECTIVE:   DIAGNOSTIC FINDINGS:  n/a  COGNITION: Overall cognitive status: Within functional limits for tasks assessed   SENSATION: WFL  MUSCLE LENGTH: Hip flex in supine: 10 deg from neutral (LLE), 4 deg from neutral (RLE) Thomas test: Right -29 deg; Left -29 deg        TODAY'S TREATMENT:                                                                                                                                         DATE: 08/09/22  GAIT: Gait pattern: step to pattern, step through pattern, decreased stride length, and trunk flexed Distance walked: 48' with one quarter and one half turn Assistive device utilized: Environmental consultant - 2 wheeled (utilized standard walker but had her place hands on lower bar to be appropriate height, gray bars at 18") wrapped wash cloths and coban around to improve comfort.  Level of assistance: Min A from PT posterior to pt, she is able to manage walker today and did not have chair follow (+2 was in session for safety and to observe).  Needed cues for turning walker/sequencing with prostheses today but she is able to turn on her own today with increased time.   Comments: Cues for posture and hand placement.  Had pt scoot to [place] then place hands on walker, then placing prostheses to floor and when getting back to chair had her push from walker and lift buttocks to chair.  PT did assist minimally to arm chair/mat table when returning to sitting, but assisted more moderately into w/c due to increased height.  PT educated that she could call [employer] and have them come to house to lower her scooter.     CARDIOVASCULAR RESPONSE:  Post-activity vitals: Following walking BP:  HR: 95 SpO2: 94-97%   CURRENT PROSTHETIC WEAR EDUCATION: Patient is independent with: proper wear schedule/adjustment and proper weight-bearing schedule/adjustment Patient is dependent with: prosthetic cleaning,  ply sock cleaning, and correct ply sock adjustment Donning prosthesis: Modified independence Doffing  prosthesis: Modified independence Prosthetic wear tolerance: Note that she is wearing liners most of her awake hours, but not really wearing prostheses.  PT educated on increasing wear time, beginning to 2-3 hours in am and another 2-3 hours in pm to build up tolerance.   Prosthetic weight bearing tolerance: has been standing some at home, but not wearing prostheses very much at all.  Edema: none noted Residual limb condition: good condition  Prosthetic description: B stubbies K code/activity level with prosthetic use: Level 2 Therex: Performed HEP as below.  Will provide to her at next session to ensure she can get into/out of standing by herself safely.   TREATMENT:  PATIENT EDUCATED ON FOLLOWING PROSTHETIC CARE: Prosthetic wear tolerance: Continue most of awake hours as long as skin is intact without issues/pain, see above Prosthetic weight bearing tolerance: 1 minutes Other education  Skin check, Prosthetic cleaning, Ply sock cleaning, Correct ply sock adjustment, Proper wear schedule/adjustment, and Proper weight-bearing schedule/adjustment  PATIENT EDUCATION: Education details: initial HEP  Person educated: Patient Education method: Explanation Education comprehension: verbalized understanding and needs further education  HOME EXERCISE PROGRAM: Access Code: [email or web address] URL: [email or web address] Date: 08/09/2022 Prepared by: Cameron Sprang   Exercises - [place] on Table  - 1 x daily - 7 x weekly - 1 sets - 2 reps - 1-2 mins hold - Supine Gluteal Sets  - 1 x daily - 7 x weekly - 1 sets - 10 reps - Sidelying Hip Abduction  - 1 x daily - 7 x weekly - 1 sets - 10 reps  Standing with walker in front of you, place hips against chair/sofa (any seating surface that will be about at hip level in standing for you).  Keep feet under hips and work on moving in all directions.  Lean to the R and come back to midline (perform 10 reps).  Lean to the L and come back to middle  (perform x 10 reps).  Lean trunk slightly forward and come back to middle (perform 10 reps).  Try to do without UE support, but use walker if you need to for safety.    ASSESSMENT:  CLINICAL IMPRESSION: Skilled session focused on providing HEP for hip flexibility and strengthening along with standing/supported at hips core strengthening.  Pt tolerated well.  She is also doing better managing walker during gait and turns.    OBJECTIVE IMPAIRMENTS: Abnormal gait, decreased activity tolerance, decreased balance, decreased endurance, decreased knowledge of use of DME, decreased mobility, difficulty walking, decreased ROM, decreased strength, impaired perceived functional ability, impaired flexibility, and prosthetic dependency .   ACTIVITY LIMITATIONS: carrying, lifting, bending, standing, squatting, stairs, transfers, and locomotion level  PARTICIPATION LIMITATIONS: meal prep, cleaning, laundry, shopping, and community activity  PERSONAL FACTORS: 3+ comorbidities: see above  are also affecting patient's functional outcome.   REHAB POTENTIAL: Good  CLINICAL DECISION MAKING: Evolving/moderate complexity  EVALUATION COMPLEXITY: Moderate   GOALS: Goals reviewed with patient? Yes  SHORT TERM GOALS: Target date: 08/30/2022  Pt will be independent in core strengthening and hip flexibility HEP.   Baseline: None initiated at this time Goal status: INITIAL  2.  Pt will perform sit<>stand to appropriate height walker with correct technique at S level.  Baseline: min A Goal status: INITIAL  3.  Pt will report independent donning/doffing of B prostheses and tolerating wear of them 90% of awake hours.  Baseline: wearing several hours a day  Goal status: INITIAL  4.  Pt will ambulate x 25' with appropriate height RW (hands at 18.5") at [facility].   Baseline: +2 for assist with chair Goal status: INITIAL  5.  Pt will perform safe turning with RW at [facility] level in order to indicate safety at home.   Baseline:  Goal status: INITIAL  6.  Pt will tolerate standing without UE support (hips supported) x 5 mins in order to indicate improved endurance and core strength.  Baseline:  Goal status: INITIAL  LONG TERM GOALS: Target date: 09/30/2022  Pt will be independent with final HEP in order to indicate improved core strength and hip flexibility.  Baseline:  Goal status: INITIAL  2.  Pt will improve gait speed from 0.10 ft/sec to at least 0.80 ft/sec in order to indicate improved confidence with gait.  Baseline:  Goal status: INITIAL  3.  Pt will perform sit<>stand transfers at mod I level (with or without AD as appropriate).  Baseline:  Goal status: INITIAL  4.  Pt will ambulate x 50' with RW at mod I level with safe turns/negotiation around obstacles in order to indicate safe household ambulation.  Baseline:  Goal status: INITIAL  5.  Pt will tolerate standing unsupported (hip unsupported also) x 3-5 mins in order to indicate improved core strength and standing tolerance.  Baseline:  Goal status: INITIAL  6.  Will initiate gait with LRAD as able.  Baseline:  Goal status: INITIAL   PLAN:  PT FREQUENCY: 2x/week  PT DURATION: 8 weeks  PLANNED INTERVENTIONS: Therapeutic exercises, Therapeutic activity, Neuromuscular re-education, Balance training, Gait training, Patient/Family education, Self Care, Joint mobilization, Prosthetic training, and DME instructions  PLAN FOR NEXT SESSION: Keep a check on her vitals. Give her a print out of HEP (Marissa I typed in the HEP section the exercises for standing against the mat-can give that to her but just make sure she can do by herself safely), sit<>stand to shortened walker (use lower gray bars at 18.5" from floor), pushing back up to chair, gait with shortened walker.  Work on getting peds walker   Cameron Sprang, PT, MPT [facility] [address] [place], [place], [address] Phone:  [phone]   Fax:  [phone] 08/09/22, 12:52 PM

## 2022-08-09 NOTE — Patient Instructions (Signed)
Access Code: [email or web address] URL: [email or web address] Date: 08/09/2022 Prepared by: Harriet Butte  Exercises - Erby Pian on Table  - 1 x daily - 7 x weekly - 1 sets - 2 reps - 1-2 mins hold - Supine Gluteal Sets  - 1 x daily - 7 x weekly - 1 sets - 10 reps - Sidelying Hip Abduction  - 1 x daily - 7 x weekly - 1 sets - 10 reps

## 2022-08-11 ENCOUNTER — Ambulatory Visit: Payer: Medicare Other | Admitting: Physical Therapy

## 2022-08-14 ENCOUNTER — Ambulatory Visit: Payer: Medicare Other | Admitting: Physical Therapy

## 2022-08-16 ENCOUNTER — Ambulatory Visit: Payer: Medicare Other | Admitting: Rehabilitation

## 2022-08-22 ENCOUNTER — Ambulatory Visit: Payer: Medicare Other | Admitting: Physical Therapy

## 2022-08-28 ENCOUNTER — Emergency Department (HOSPITAL_COMMUNITY): Payer: Medicare HMO

## 2022-08-28 ENCOUNTER — Inpatient Hospital Stay (HOSPITAL_COMMUNITY): Payer: Medicare HMO | Admitting: Anesthesiology

## 2022-08-28 ENCOUNTER — Encounter (HOSPITAL_COMMUNITY)
Admission: EM | Disposition: A | Discharge status: Skilled Nursing Facility | Payer: Self-pay | Source: Home / Self Care | Attending: Neurology

## 2022-08-28 ENCOUNTER — Inpatient Hospital Stay (HOSPITAL_COMMUNITY): Payer: Medicare HMO

## 2022-08-28 ENCOUNTER — Inpatient Hospital Stay (HOSPITAL_COMMUNITY)
Admission: EM | Admit: 2022-08-28 | Discharge: 2022-09-18 | DRG: 023 | Disposition: A | Discharge status: Skilled Nursing Facility | Payer: Medicare HMO | Attending: Neurology | Admitting: Neurology

## 2022-08-28 DIAGNOSIS — I2510 Atherosclerotic heart disease of native coronary artery without angina pectoris: Secondary | ICD-10-CM | POA: Diagnosis present

## 2022-08-28 DIAGNOSIS — I63231 Cerebral infarction due to unspecified occlusion or stenosis of right carotid arteries: Secondary | ICD-10-CM

## 2022-08-28 DIAGNOSIS — I495 Sick sinus syndrome: Secondary | ICD-10-CM | POA: Diagnosis present

## 2022-08-28 DIAGNOSIS — I6523 Occlusion and stenosis of bilateral carotid arteries: Secondary | ICD-10-CM | POA: Diagnosis not present

## 2022-08-28 DIAGNOSIS — E1169 Type 2 diabetes mellitus with other specified complication: Secondary | ICD-10-CM | POA: Diagnosis present

## 2022-08-28 DIAGNOSIS — I609 Nontraumatic subarachnoid hemorrhage, unspecified: Secondary | ICD-10-CM | POA: Diagnosis present

## 2022-08-28 DIAGNOSIS — R29720 NIHSS score 20: Secondary | ICD-10-CM | POA: Diagnosis not present

## 2022-08-28 DIAGNOSIS — G8194 Hemiplegia, unspecified affecting left nondominant side: Secondary | ICD-10-CM | POA: Diagnosis not present

## 2022-08-28 DIAGNOSIS — I739 Peripheral vascular disease, unspecified: Secondary | ICD-10-CM | POA: Diagnosis not present

## 2022-08-28 DIAGNOSIS — Z95810 Presence of automatic (implantable) cardiac defibrillator: Secondary | ICD-10-CM

## 2022-08-28 DIAGNOSIS — Z8673 Personal history of transient ischemic attack (TIA), and cerebral infarction without residual deficits: Secondary | ICD-10-CM | POA: Diagnosis not present

## 2022-08-28 DIAGNOSIS — I639 Cerebral infarction, unspecified: Secondary | ICD-10-CM | POA: Diagnosis not present

## 2022-08-28 DIAGNOSIS — E785 Hyperlipidemia, unspecified: Secondary | ICD-10-CM | POA: Diagnosis present

## 2022-08-28 DIAGNOSIS — D689 Coagulation defect, unspecified: Secondary | ICD-10-CM | POA: Diagnosis present

## 2022-08-28 DIAGNOSIS — N179 Acute kidney failure, unspecified: Secondary | ICD-10-CM | POA: Diagnosis not present

## 2022-08-28 DIAGNOSIS — Z9109 Other allergy status, other than to drugs and biological substances: Secondary | ICD-10-CM

## 2022-08-28 DIAGNOSIS — R404 Transient alteration of awareness: Secondary | ICD-10-CM | POA: Diagnosis not present

## 2022-08-28 DIAGNOSIS — Z7901 Long term (current) use of anticoagulants: Secondary | ICD-10-CM

## 2022-08-28 DIAGNOSIS — M069 Rheumatoid arthritis, unspecified: Secondary | ICD-10-CM | POA: Diagnosis not present

## 2022-08-28 DIAGNOSIS — F419 Anxiety disorder, unspecified: Secondary | ICD-10-CM | POA: Diagnosis present

## 2022-08-28 DIAGNOSIS — I6521 Occlusion and stenosis of right carotid artery: Secondary | ICD-10-CM | POA: Diagnosis not present

## 2022-08-28 DIAGNOSIS — Z8249 Family history of ischemic heart disease and other diseases of the circulatory system: Secondary | ICD-10-CM

## 2022-08-28 DIAGNOSIS — K219 Gastro-esophageal reflux disease without esophagitis: Secondary | ICD-10-CM | POA: Diagnosis present

## 2022-08-28 DIAGNOSIS — R6889 Other general symptoms and signs: Secondary | ICD-10-CM | POA: Diagnosis not present

## 2022-08-28 DIAGNOSIS — Z1152 Encounter for screening for COVID-19: Secondary | ICD-10-CM | POA: Diagnosis not present

## 2022-08-28 DIAGNOSIS — I63421 Cerebral infarction due to embolism of right anterior cerebral artery: Secondary | ICD-10-CM | POA: Diagnosis present

## 2022-08-28 DIAGNOSIS — R233 Spontaneous ecchymoses: Secondary | ICD-10-CM | POA: Diagnosis present

## 2022-08-28 DIAGNOSIS — I480 Paroxysmal atrial fibrillation: Secondary | ICD-10-CM | POA: Diagnosis present

## 2022-08-28 DIAGNOSIS — Z89611 Acquired absence of right leg above knee: Secondary | ICD-10-CM

## 2022-08-28 DIAGNOSIS — Z89612 Acquired absence of left leg above knee: Secondary | ICD-10-CM | POA: Diagnosis not present

## 2022-08-28 DIAGNOSIS — R29721 NIHSS score 21: Secondary | ICD-10-CM | POA: Diagnosis not present

## 2022-08-28 DIAGNOSIS — I429 Cardiomyopathy, unspecified: Secondary | ICD-10-CM | POA: Diagnosis not present

## 2022-08-28 DIAGNOSIS — G936 Cerebral edema: Secondary | ICD-10-CM | POA: Diagnosis not present

## 2022-08-28 DIAGNOSIS — R569 Unspecified convulsions: Secondary | ICD-10-CM

## 2022-08-28 DIAGNOSIS — R29719 NIHSS score 19: Secondary | ICD-10-CM | POA: Diagnosis present

## 2022-08-28 DIAGNOSIS — R531 Weakness: Secondary | ICD-10-CM | POA: Diagnosis not present

## 2022-08-28 DIAGNOSIS — I69391 Dysphagia following cerebral infarction: Secondary | ICD-10-CM

## 2022-08-28 DIAGNOSIS — Z87891 Personal history of nicotine dependence: Secondary | ICD-10-CM

## 2022-08-28 DIAGNOSIS — R1310 Dysphagia, unspecified: Secondary | ICD-10-CM

## 2022-08-28 DIAGNOSIS — I10 Essential (primary) hypertension: Secondary | ICD-10-CM | POA: Diagnosis present

## 2022-08-28 DIAGNOSIS — R29810 Facial weakness: Secondary | ICD-10-CM | POA: Diagnosis present

## 2022-08-28 DIAGNOSIS — E860 Dehydration: Secondary | ICD-10-CM | POA: Diagnosis present

## 2022-08-28 DIAGNOSIS — R69 Illness, unspecified: Secondary | ICD-10-CM | POA: Diagnosis not present

## 2022-08-28 DIAGNOSIS — Z743 Need for continuous supervision: Secondary | ICD-10-CM | POA: Diagnosis not present

## 2022-08-28 DIAGNOSIS — G935 Compression of brain: Secondary | ICD-10-CM | POA: Diagnosis not present

## 2022-08-28 DIAGNOSIS — D638 Anemia in other chronic diseases classified elsewhere: Secondary | ICD-10-CM | POA: Diagnosis not present

## 2022-08-28 DIAGNOSIS — I63411 Cerebral infarction due to embolism of right middle cerebral artery: Principal | ICD-10-CM | POA: Diagnosis present

## 2022-08-28 DIAGNOSIS — I6603 Occlusion and stenosis of bilateral middle cerebral arteries: Secondary | ICD-10-CM | POA: Diagnosis not present

## 2022-08-28 DIAGNOSIS — Z951 Presence of aortocoronary bypass graft: Secondary | ICD-10-CM

## 2022-08-28 DIAGNOSIS — S066X0A Traumatic subarachnoid hemorrhage without loss of consciousness, initial encounter: Secondary | ICD-10-CM | POA: Diagnosis not present

## 2022-08-28 DIAGNOSIS — M329 Systemic lupus erythematosus, unspecified: Secondary | ICD-10-CM | POA: Diagnosis not present

## 2022-08-28 DIAGNOSIS — I5030 Unspecified diastolic (congestive) heart failure: Secondary | ICD-10-CM | POA: Diagnosis not present

## 2022-08-28 DIAGNOSIS — F32A Depression, unspecified: Secondary | ICD-10-CM | POA: Diagnosis present

## 2022-08-28 DIAGNOSIS — I6389 Other cerebral infarction: Secondary | ICD-10-CM | POA: Diagnosis not present

## 2022-08-28 DIAGNOSIS — Z91040 Latex allergy status: Secondary | ICD-10-CM

## 2022-08-28 DIAGNOSIS — I152 Hypertension secondary to endocrine disorders: Secondary | ICD-10-CM | POA: Diagnosis present

## 2022-08-28 DIAGNOSIS — E119 Type 2 diabetes mellitus without complications: Secondary | ICD-10-CM

## 2022-08-28 DIAGNOSIS — E43 Unspecified severe protein-calorie malnutrition: Secondary | ICD-10-CM | POA: Insufficient documentation

## 2022-08-28 DIAGNOSIS — R471 Dysarthria and anarthria: Secondary | ICD-10-CM | POA: Diagnosis present

## 2022-08-28 DIAGNOSIS — M3500 Sicca syndrome, unspecified: Secondary | ICD-10-CM | POA: Insufficient documentation

## 2022-08-28 DIAGNOSIS — I5181 Takotsubo syndrome: Secondary | ICD-10-CM | POA: Diagnosis present

## 2022-08-28 DIAGNOSIS — R1312 Dysphagia, oropharyngeal phase: Secondary | ICD-10-CM | POA: Diagnosis not present

## 2022-08-28 DIAGNOSIS — E1151 Type 2 diabetes mellitus with diabetic peripheral angiopathy without gangrene: Secondary | ICD-10-CM | POA: Diagnosis not present

## 2022-08-28 DIAGNOSIS — Z4682 Encounter for fitting and adjustment of non-vascular catheter: Secondary | ICD-10-CM | POA: Diagnosis not present

## 2022-08-28 DIAGNOSIS — Z79899 Other long term (current) drug therapy: Secondary | ICD-10-CM

## 2022-08-28 DIAGNOSIS — I69320 Aphasia following cerebral infarction: Secondary | ICD-10-CM

## 2022-08-28 DIAGNOSIS — I252 Old myocardial infarction: Secondary | ICD-10-CM

## 2022-08-28 DIAGNOSIS — R9082 White matter disease, unspecified: Secondary | ICD-10-CM | POA: Diagnosis present

## 2022-08-28 DIAGNOSIS — Z888 Allergy status to other drugs, medicaments and biological substances status: Secondary | ICD-10-CM

## 2022-08-28 HISTORY — DX: Cerebral infarction due to unspecified occlusion or stenosis of right carotid arteries: I63.231

## 2022-08-28 HISTORY — PX: IR CT HEAD LTD: IMG2386

## 2022-08-28 HISTORY — PX: IR PERCUTANEOUS ART THROMBECTOMY/INFUSION INTRACRANIAL INC DIAG ANGIO: IMG6087

## 2022-08-28 HISTORY — PX: IR US GUIDE VASC ACCESS RIGHT: IMG2390

## 2022-08-28 HISTORY — PX: RADIOLOGY WITH ANESTHESIA: SHX6223

## 2022-08-28 LAB — CBC
HCT: 40.2 % (ref 36.0–46.0)
HCT: 39 % (ref 36.0–46.0)
Hemoglobin: 13.1 g/dL (ref 12.0–15.0)
Hemoglobin: 12.5 g/dL (ref 12.0–15.0)
MCH: 24 pg — ABNORMAL LOW (ref 26.0–34.0)
MCH: 23.9 pg — ABNORMAL LOW (ref 26.0–34.0)
MCHC: 32.6 g/dL (ref 30.0–36.0)
MCHC: 32.1 g/dL (ref 30.0–36.0)
MCV: 73.6 fL — ABNORMAL LOW (ref 80.0–100.0)
MCV: 74.7 fL — ABNORMAL LOW (ref 80.0–100.0)
Platelets: 315 10*3/uL (ref 150–400)
Platelets: 293 10*3/uL (ref 150–400)
RBC: 5.46 MIL/uL — ABNORMAL HIGH (ref 3.87–5.11)
RBC: 5.22 MIL/uL — ABNORMAL HIGH (ref 3.87–5.11)
RDW: 17.7 % — ABNORMAL HIGH (ref 11.5–15.5)
RDW: 17.8 % — ABNORMAL HIGH (ref 11.5–15.5)
WBC: 7.5 10*3/uL (ref 4.0–10.5)
WBC: 8.2 10*3/uL (ref 4.0–10.5)
nRBC: 0 % (ref 0.0–0.2)
nRBC: 0 % (ref 0.0–0.2)

## 2022-08-28 LAB — RESP PANEL BY RT-PCR (RSV, FLU A&B, COVID)  RVPGX2
Influenza A by PCR: NEGATIVE
Influenza B by PCR: NEGATIVE
Resp Syncytial Virus by PCR: NEGATIVE
SARS Coronavirus 2 by RT PCR: NEGATIVE

## 2022-08-28 LAB — DIFFERENTIAL
Abs Immature Granulocytes: 0.02 10*3/uL (ref 0.00–0.07)
Basophils Absolute: 0.1 10*3/uL (ref 0.0–0.1)
Basophils Relative: 2 %
Eosinophils Absolute: 0.3 10*3/uL (ref 0.0–0.5)
Eosinophils Relative: 4 %
Immature Granulocytes: 0 %
Lymphocytes Relative: 38 %
Lymphs Abs: 2.9 10*3/uL (ref 0.7–4.0)
Monocytes Absolute: 0.6 10*3/uL (ref 0.1–1.0)
Monocytes Relative: 8 %
Neutro Abs: 3.6 10*3/uL (ref 1.7–7.7)
Neutrophils Relative %: 48 %

## 2022-08-28 LAB — I-STAT CHEM 8, ED
BUN: 22 mg/dL (ref 8–23)
Calcium, Ion: 1.1 mmol/L — ABNORMAL LOW (ref 1.15–1.40)
Chloride: 105 mmol/L (ref 98–111)
Creatinine, Ser: 1.5 mg/dL — ABNORMAL HIGH (ref 0.44–1.00)
Glucose, Bld: 133 mg/dL — ABNORMAL HIGH (ref 70–99)
HCT: 43 % (ref 36.0–46.0)
Hemoglobin: 14.6 g/dL (ref 12.0–15.0)
Potassium: 4.7 mmol/L (ref 3.5–5.1)
Sodium: 135 mmol/L (ref 135–145)
TCO2: 22 mmol/L (ref 22–32)

## 2022-08-28 LAB — COMPREHENSIVE METABOLIC PANEL
ALT: 10 U/L (ref 0–44)
AST: 30 U/L (ref 15–41)
Albumin: 3.2 g/dL — ABNORMAL LOW (ref 3.5–5.0)
Alkaline Phosphatase: 101 U/L (ref 38–126)
Anion gap: 13 (ref 5–15)
BUN: 16 mg/dL (ref 8–23)
CO2: 17 mmol/L — ABNORMAL LOW (ref 22–32)
Calcium: 9 mg/dL (ref 8.9–10.3)
Chloride: 102 mmol/L (ref 98–111)
Creatinine, Ser: 1.45 mg/dL — ABNORMAL HIGH (ref 0.44–1.00)
GFR, Estimated: 39 mL/min — ABNORMAL LOW (ref >60)
Glucose, Bld: 132 mg/dL — ABNORMAL HIGH (ref 70–99)
Potassium: 4.1 mmol/L (ref 3.5–5.1)
Sodium: 132 mmol/L — ABNORMAL LOW (ref 135–145)
Total Bilirubin: 1 mg/dL (ref 0.3–1.2)
Total Protein: 7.9 g/dL (ref 6.5–8.1)

## 2022-08-28 LAB — PROTIME-INR
INR: 1 (ref 0.8–1.2)
Prothrombin Time: 13.4 seconds (ref 11.4–15.2)

## 2022-08-28 LAB — CREATININE, SERUM
Creatinine, Ser: 1.26 mg/dL — ABNORMAL HIGH (ref 0.44–1.00)
GFR, Estimated: 47 mL/min — ABNORMAL LOW

## 2022-08-28 LAB — CBG MONITORING, ED: Glucose-Capillary: 140 mg/dL — ABNORMAL HIGH (ref 70–99)

## 2022-08-28 LAB — MRSA NEXT GEN BY PCR, NASAL: MRSA by PCR Next Gen: NOT DETECTED

## 2022-08-28 LAB — HIV ANTIBODY (ROUTINE TESTING W REFLEX): HIV Screen 4th Generation wRfx: NONREACTIVE

## 2022-08-28 LAB — ETHANOL: Alcohol, Ethyl (B): <10 mg/dL (ref <10)

## 2022-08-28 LAB — APTT: aPTT: 27 seconds (ref 24–36)

## 2022-08-28 SURGERY — RADIOLOGY WITH ANESTHESIA
Anesthesia: General

## 2022-08-28 MED ORDER — LABETALOL HCL 5 MG/ML IV SOLN
INTRAVENOUS | Status: DC | PRN
  Administered 2022-08-28: 10 mg via INTRAVENOUS

## 2022-08-28 MED ORDER — ACETAMINOPHEN 650 MG RE SUPP
650.0000 mg | RECTAL | Status: DC | PRN
  Filled 2022-08-28: qty 1

## 2022-08-28 MED ORDER — SUCCINYLCHOLINE 20MG/ML (10ML) SYRINGE FOR MEDFUSION PUMP - OPTIME
INTRAMUSCULAR | Status: DC | PRN
  Administered 2022-08-28: 100 mg via INTRAVENOUS

## 2022-08-28 MED ORDER — ENOXAPARIN SODIUM 30 MG/0.3ML IJ SOSY
30.0000 mg | PREFILLED_SYRINGE | INTRAMUSCULAR | Status: DC
  Administered 2022-08-28 – 2022-09-06 (×10): 30 mg via SUBCUTANEOUS
  Filled 2022-08-28 (×10): qty 0.3

## 2022-08-28 MED ORDER — ROCURONIUM BROMIDE 10 MG/ML (PF) SYRINGE
PREFILLED_SYRINGE | INTRAVENOUS | Status: DC | PRN
  Administered 2022-08-28: 10 mg via INTRAVENOUS
  Administered 2022-08-28: 50 mg via INTRAVENOUS

## 2022-08-28 MED ORDER — ACETAMINOPHEN 160 MG/5ML PO SOLN: 650.0000 mg | ORAL | Status: DC | PRN

## 2022-08-28 MED ORDER — ORAL CARE MOUTH RINSE: 15.0000 mL | OROMUCOSAL | Status: DC | PRN

## 2022-08-28 MED ORDER — CHLORHEXIDINE GLUCONATE CLOTH 2 % EX PADS
6.0000 | MEDICATED_PAD | Freq: Every day | CUTANEOUS | Status: DC
  Administered 2022-08-28 – 2022-09-03 (×7): 6 via TOPICAL

## 2022-08-28 MED ORDER — CLEVIDIPINE BUTYRATE 0.5 MG/ML IV EMUL
0.0000 mg/h | INTRAVENOUS | Status: DC
  Administered 2022-08-28: 14 mg/h via INTRAVENOUS
  Administered 2022-08-29: 2 mg/h via INTRAVENOUS
  Administered 2022-08-29: 4 mg/h via INTRAVENOUS
  Administered 2022-08-29: 6 mg/h via INTRAVENOUS
  Administered 2022-08-30: 4 mg/h via INTRAVENOUS
  Filled 2022-08-28 (×3): qty 100
  Filled 2022-08-28: qty 50
  Filled 2022-08-28: qty 100

## 2022-08-28 MED ORDER — CLEVIDIPINE BUTYRATE 0.5 MG/ML IV EMUL
INTRAVENOUS | Status: DC | PRN
  Administered 2022-08-28: 2 mg/h via INTRAVENOUS

## 2022-08-28 MED ORDER — IOHEXOL 300 MG/ML  SOLN
100.0000 mL | Freq: Once | INTRAMUSCULAR | Status: AC | PRN
  Administered 2022-08-28: 50 mL via INTRA_ARTERIAL

## 2022-08-28 MED ORDER — PROPOFOL 10 MG/ML IV BOLUS
INTRAVENOUS | Status: DC | PRN
  Administered 2022-08-28: 100 mg via INTRAVENOUS

## 2022-08-28 MED ORDER — IOHEXOL 350 MG/ML SOLN
75.0000 mL | Freq: Once | INTRAVENOUS | Status: AC | PRN
  Administered 2022-08-28: 75 mL via INTRAVENOUS

## 2022-08-28 MED ORDER — PHENYLEPHRINE HCL-NACL 20-0.9 MG/250ML-% IV SOLN
INTRAVENOUS | Status: DC | PRN
  Administered 2022-08-28: 40 ug/min via INTRAVENOUS

## 2022-08-28 MED ORDER — SENNOSIDES-DOCUSATE SODIUM 8.6-50 MG PO TABS
1.0000 | ORAL_TABLET | Freq: Every evening | ORAL | Status: DC | PRN
  Administered 2022-09-04: 1
  Filled 2022-08-28: qty 1

## 2022-08-28 MED ORDER — STROKE: EARLY STAGES OF RECOVERY BOOK
Freq: Once | Status: AC
  Filled 2022-08-28: qty 1

## 2022-08-28 MED ORDER — IOHEXOL 300 MG/ML  SOLN
50.0000 mL | Freq: Once | INTRAMUSCULAR | Status: AC | PRN
  Administered 2022-08-28: 10 mL via INTRA_ARTERIAL

## 2022-08-28 MED ORDER — SODIUM CHLORIDE 0.9 % IV SOLN: INTRAVENOUS | Status: DC | PRN

## 2022-08-28 MED ORDER — ACETAMINOPHEN 325 MG PO TABS: 650.0000 mg | ORAL_TABLET | ORAL | Status: DC | PRN

## 2022-08-28 MED ORDER — SUGAMMADEX SODIUM 200 MG/2ML IV SOLN
INTRAVENOUS | Status: DC | PRN
  Administered 2022-08-28: 200 mg via INTRAVENOUS

## 2022-08-28 MED ORDER — FENTANYL CITRATE (PF) 100 MCG/2ML IJ SOLN
INTRAMUSCULAR | Status: DC | PRN
  Administered 2022-08-28: 100 ug via INTRAVENOUS

## 2022-08-28 MED ORDER — ONDANSETRON HCL 4 MG/2ML IJ SOLN
INTRAMUSCULAR | Status: DC | PRN
  Administered 2022-08-28: 4 mg via INTRAVENOUS

## 2022-08-28 MED ORDER — ACETAMINOPHEN 160 MG/5ML PO SOLN
650.0000 mg | ORAL | Status: DC | PRN
  Administered 2022-08-30 – 2022-09-02 (×7): 650 mg
  Filled 2022-08-28 (×7): qty 20.3

## 2022-08-28 MED ORDER — SODIUM CHLORIDE 0.9% FLUSH
3.0000 mL | Freq: Once | INTRAVENOUS | Status: AC
  Administered 2022-08-28: 3 mL via INTRAVENOUS

## 2022-08-28 MED ORDER — CLEVIDIPINE BUTYRATE 0.5 MG/ML IV EMUL
INTRAVENOUS | Status: AC
  Administered 2022-08-28: 12 mg/h via INTRAVENOUS
  Filled 2022-08-28: qty 50

## 2022-08-28 MED ORDER — ACETAMINOPHEN 325 MG PO TABS
650.0000 mg | ORAL_TABLET | ORAL | Status: DC | PRN
  Administered 2022-09-01 – 2022-09-16 (×12): 650 mg via ORAL
  Filled 2022-08-28 (×14): qty 2

## 2022-08-28 MED ORDER — DEXAMETHASONE SODIUM PHOSPHATE 10 MG/ML IJ SOLN
INTRAMUSCULAR | Status: DC | PRN
  Administered 2022-08-28: 10 mg via INTRAVENOUS

## 2022-08-28 MED ORDER — ACETAMINOPHEN 650 MG RE SUPP: 650.0000 mg | RECTAL | Status: DC | PRN

## 2022-08-28 MED ORDER — ORAL CARE MOUTH RINSE
15.0000 mL | OROMUCOSAL | Status: DC
  Administered 2022-08-28 – 2022-08-29 (×4): 15 mL via OROMUCOSAL

## 2022-08-28 MED ORDER — FENTANYL CITRATE (PF) 100 MCG/2ML IJ SOLN
INTRAMUSCULAR | Status: AC
  Filled 2022-08-28: qty 2

## 2022-08-28 NOTE — ED Provider Notes (Signed)
[facility] EMERGENCY DEPARTMENT Provider Note   CSN: [id number] Arrival date & time: 08/28/22  1735  An emergency department physician performed an initial assessment on this suspected stroke patient at 1739.  History  Chief Complaint  Patient presents with   Code Stroke    Mercedes [name] is a 67 y.o. 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  Presented found by friend slumped over, LKN 1500, w/ right sided gaze preference and left-sided weakness, nonverbal, not answering questions.  Does have history of prior stroke with some residual aphasia but is normally able to answer some questions.  Does take Eliquis but unknown last dosage.  BP 220/100.  CBG 151 mg/dL.  HPI     Home Medications Prior to Admission medications   Medication Sig Start Date End Date Taking? Authorizing Provider  acetaminophen (TYLENOL) 500 MG tablet Take 500-1,000 mg by mouth every 8 (eight) hours as needed for mild pain or headache.    [provider]  ELIQUIS 5 MG TABS tablet TAKE 1 TABLET BY MOUTH TWICE  DAILY 04/20/22   Pincus Sanes, MD  metoprolol tartrate (LOPRESSOR) 25 MG tablet Take 0.5 tablets (12.5 mg total) by mouth 2 (two) times daily. 01/05/22   Pincus Sanes, MD  nitroGLYCERIN (NITROSTAT) 0.4 MG SL tablet Place 1 tablet (0.4 mg total) under the tongue every 5 (five) minutes as needed for chest pain. 12/29/21   Pincus Sanes, MD  rosuvastatin (CRESTOR) 40 MG tablet Take 1 tablet (40 mg total) by mouth daily. 06/29/22   Corrin Parker, PA-C      Allergies    Brilinta [ticagrelor], Latex, and Tape    Review of Systems   Review of Systems  Unable to perform ROS: Patient nonverbal    Physical Exam Updated Vital Signs BP 123/75   Pulse 69   Temp 97.8 F (36.6 C) (Axillary)    Resp 12   Wt 43.4 kg   SpO2 98%   BMI 15.92 kg/m  Physical Exam General: Elderly appearing female, lying in bed.  HEENT: PERRLA, R sided gaze preference, Sclera anicteric, dry mucous membranes, trachea midline.  Cardiology: RRR, no murmurs/rubs/gallops. BL radial and DP pulses equal bilaterally.  Resp: Normal respiratory rate and effort. CTAB, no wheezes, rhonchi, crackles.  Abd: Soft, non-tender, non-distended. No rebound tenderness or guarding.  MSK: BL AKAs. No peripheral edema or signs of trauma.  Skin: warm, dry. No rashes or lesions. Neuro: Unresponsive to questions, blinks to threat on the right, L facial droop noted, head deviated to the right. Spontaneously moving RUE but not moving LUE. Spontaneously moving BL LEs, R>L. Reacts to noxious stimuli in all four extremities.  Psych: UTA  1a  Level of consciousness: 0=alert; keenly responsive  1b. LOC questions:  2=Performs neither task correctly  1c. LOC commands: 0=Performs both tasks correctly  2.  Best Gaze: 2=forced deviation, or total gaze paresis not overcome by oculocephalic maneuver  3.  Visual: 3=Bilateral hemianopia (blind including cortical blindness)  4. Facial Palsy: 1=Minor paralysis (flattened nasolabial fold, asymmetric on smiling)  5a.  Motor left arm: 4=No movement  5b.  Motor right arm: 0=No drift, limb holds 90 (or 45) degrees for full 10 seconds  6a. motor left leg: 2=Some effort against gravity, limb cannot get to or maintain (if cured)  90 (or 45) degrees, drifts down to bed, but has some effort against gravity  6b  Motor right leg:  0=No drift, limb holds 90 (or 45) degrees for full 10 seconds  7. Limb Ataxia: 0=Absent  8.  Sensory: 0=Normal; no sensory loss  9. Best Language:  3=Mute, global aphasia; no usable speech or auditory comprehension  10. Dysarthria: 2=Severe; patient speech is so slurred as to be unintelligible in the absence of or our of proportion to any dysphagia, or is mute/anarthric  11.  Extinction and Inattention: 0=No abnormality   Total:   19        ED Results / Procedures / Treatments   Labs (all labs ordered are listed, but only abnormal results are displayed) Labs Reviewed  CBC - Abnormal; Notable for the following components:      Result Value   RBC 5.46 (*)    MCV 73.6 (*)    MCH 24.0 (*)    RDW 17.7 (*)    All other components within normal limits  COMPREHENSIVE METABOLIC PANEL - Abnormal; Notable for the following components:   Sodium 132 (*)    CO2 17 (*)    Glucose, Bld 132 (*)    Creatinine, Ser 1.45 (*)    Albumin 3.2 (*)    GFR, Estimated 39 (*)    All other components within normal limits  CBC - Abnormal; Notable for the following components:   RBC 5.22 (*)    MCV 74.7 (*)    MCH 23.9 (*)    RDW 17.8 (*)    All other components within normal limits  CREATININE, SERUM - Abnormal; Notable for the following components:   Creatinine, Ser 1.26 (*)    GFR, Estimated 47 (*)    All other components within normal limits  CBG MONITORING, ED - Abnormal; Notable for the following components:   Glucose-Capillary 140 (*)    All other components within normal limits  I-STAT CHEM 8, ED - Abnormal; Notable for the following components:   Creatinine, Ser 1.50 (*)    Glucose, Bld 133 (*)    Calcium, Ion 1.10 (*)    All other components within normal limits  RESP PANEL BY RT-PCR (RSV, FLU A&B, COVID)  RVPGX2  MRSA NEXT GEN BY PCR, NASAL  PROTIME-INR  APTT  DIFFERENTIAL  ETHANOL  HIV ANTIBODY (ROUTINE TESTING W REFLEX)  LIPID PANEL  CBG MONITORING, ED    EKG EKG Interpretation  Date/Time:  Monday August 28 2022 18:11:04 EST Ventricular Rate:  88 PR Interval:  126 QRS Duration: 141 QT Interval:  369 QTC Calculation: 447 R Axis:   -51 Text Interpretation: Sinus rhythm Right bundle branch block Probable anterior infarct, age indeterminate Confirmed by Cindee Lame [id number]) on 08/28/2022 6:27:17 PM  Radiology CT ANGIO HEAD NECK W WO CM (CODE  STROKE)  Result Date: 08/28/2022 CLINICAL DATA:  Code stroke. Patient found with left-sided motor weakness and right gaze deviation. EXAM: CT ANGIOGRAPHY HEAD AND NECK TECHNIQUE: Multidetector CT imaging of the head and neck was performed using the standard protocol during bolus administration of intravenous contrast. Multiplanar CT image reconstructions and MIPs were obtained to evaluate the vascular anatomy. Carotid stenosis measurements (when applicable) are obtained utilizing NASCET criteria, using the distal internal carotid diameter as the denominator. RADIATION DOSE REDUCTION: This exam was performed according to the departmental dose-optimization program which includes automated exposure control, adjustment of the mA and/or kV according to patient size and/or use of iterative reconstruction technique. CONTRAST:  24mL OMNIPAQUE IOHEXOL 350 MG/ML SOLN COMPARISON:  CTA of the head and neck 11/21/2021 FINDINGS: CTA NECK FINDINGS Aortic arch: A common origin of the left common carotid artery and innominate artery is noted. No significant stenosis is present at the arch. Right carotid system: The right common carotid artery is within normal limits. Atherosclerotic calcifications are present at the bifurcation without a significant stenosis. High-grade stenosis is present in the right external carotid artery. The right internal carotid artery narrows in the neck and is occluded in its distal segment. There is no reconstitution prior to the skull base. Left carotid system: The left common carotid artery is within normal limits. Atherosclerotic changes are present at the carotid bifurcation. The proximal left ICA is narrowed to 1.5 mm. More distal left ICA is normal. It measures 3.8 mm. Vertebral arteries: The vertebral arteries are codominant. Both vertebral arteries originate from the subclavian arteries without significant stenosis. No significant stenosis is present in either vertebral artery in the neck.  Skeleton: Mild degenerative changes of the cervical spine are stable. No focal osseous lesions are present. Other neck: Soft tissues the neck are otherwise unremarkable. Salivary glands are within normal limits. Thyroid is normal. No significant adenopathy is present. No focal mucosal or submucosal lesions are present. Upper chest: Centrilobular emphysematous changes are present at the lung apices. No nodule or mass lesion is present. No focal airspace disease is present. Interlobular septal thickening is consistent with edema. Review of the MIP images confirms the above findings CTA HEAD FINDINGS Anterior circulation: The right internal carotid artery is occluded. Atherosclerotic calcifications are present in the cavernous left internal carotid artery without significant stenosis. The left A1 and M1 segments are normal. The anterior communicating artery is patent. Both A2 segments fill from the left. The left MCA bifurcation is within normal limits. There is some segmental narrowing in distal left MCA branch vessels without a significant proximal stenosis or occlusion. Minimal filling of distal right MCA branch vessels is present with some collaterals. The right M1 and A1 segments are occluded. Posterior circulation: The PICA origins are visualized and normal bilaterally. Vertebrobasilar junction and basilar artery are normal. The right posterior cerebral artery originates from the basilar tip. The left posterior cerebral artery is of fetal type. PCA branch vessels are normal bilaterally. Venous sinuses: The dural sinuses are patent. The straight sinus and deep cerebral veins are intact. Cortical veins are within normal limits. No significant vascular malformation is evident. Anatomic variants: Fetal type left posterior cerebral artery. Review of the MIP images confirms the above findings IMPRESSION: 1. Occlusion of the right internal carotid artery in its distal segment without reconstitution prior to the skull  base. 2. The right ICA is occluded through the ICA terminus. Right M1 and A1 segments are occluded. 3. Minimal filling of distal right MCA branch vessels with some collaterals. 4. Atherosclerotic changes at the right carotid bifurcation and cavernous left internal carotid artery without significant stenosis. 5. Fetal type left posterior cerebral artery. 6. Probable pulmonary edema. 7.  Emphysema (ICD10-J43.9). Electronically Signed   By: Marin Roberts M.D.   On: 08/28/2022 18:18   CT HEAD CODE STROKE WO CONTRAST  Result Date: 08/28/2022 CLINICAL DATA:  Code stroke. Found nonverbal with right-sided gaze and left-sided motor weakness. EXAM: CT HEAD WITHOUT CONTRAST TECHNIQUE: Contiguous axial images were obtained from the base of the skull through the vertex without intravenous contrast. RADIATION DOSE REDUCTION: This exam was performed according to the departmental dose-optimization program which includes automated  exposure control, adjustment of the mA and/or kV according to patient size and/or use of iterative reconstruction technique. COMPARISON:  CT head and CTA head and neck 11/21/2021 FINDINGS: Brain: Periventricular white matter disease is stable. Slight asymmetric hypoattenuation is present in the right lentiform nucleus and insular ribbon. Caudate head and internal capsule are within normal limits. No other cortical lesions are evident. The ventricles are of normal size. No significant extraaxial fluid collection is present. The brainstem and cerebellum are within normal limits. Vascular: Atherosclerotic calcifications are present within the cavernous internal carotid arteries bilaterally. Asymmetric hyperdensity is present in the right M1 segment. Skull: Calvarium is intact. No focal lytic or blastic lesions are present. No significant extracranial soft tissue lesion is present. Sinuses/Orbits: The paranasal sinuses and mastoid air cells are clear. The globes and orbits are within normal limits.  ASPECTS [facility] Stroke Program Early CT Score) - Ganglionic level infarction (caudate, lentiform nuclei, internal capsule, insula, M1-M3 cortex): 5/7 - Supraganglionic infarction (M4-M6 cortex): 3/3 Total score (0-10 with 10 being normal): 8/10 IMPRESSION: 1. Asymmetric hyperdensity in the right M1 segment concerning for acute thrombus. 2. Slight asymmetric hypoattenuation in the right lentiform nucleus and insular ribbon. This could represent early infarct. 3. Aspects is 8/10. 4. Stable mild periventricular white matter disease. This likely reflects the sequela of chronic microvascular ischemia. The above was relayed via text pager to Dr. Lyn Records on 08/28/2022 at 18:06 . Electronically Signed   By: [name] Morelle M.D.   On: 08/28/2022 18:06    Procedures .Critical Care  Performed by: Audley Hose, MD Authorized by: Audley Hose, MD   Critical care provider statement:    Critical care time (minutes):  30   Critical care was necessary to treat or prevent imminent or life-threatening deterioration of the following conditions:  CNS failure or compromise   Critical care was time spent personally by me on the following activities:  Development of treatment plan with patient or surrogate, discussions with consultants, evaluation of patient's response to treatment, examination of patient, ordering and review of laboratory studies, ordering and review of radiographic studies, ordering and performing treatments and interventions, pulse oximetry, re-evaluation of patient's condition, review of old charts and obtaining history from patient or surrogate   Care discussed with: admitting provider       Medications Ordered in ED Medications   stroke: early stages of recovery book (has no administration in time range)  acetaminophen (TYLENOL) tablet 650 mg (has no administration in time range)    Or  acetaminophen (TYLENOL) 160 MG/5ML solution 650 mg (has no administration in time range)    Or   acetaminophen (TYLENOL) suppository 650 mg (has no administration in time range)  senna-docusate (Senokot-S) tablet 1 tablet (has no administration in time range)  enoxaparin (LOVENOX) injection 30 mg (30 mg Subcutaneous Given 08/28/22 2127)  clevidipine (CLEVIPREX) infusion 0.5 mg/mL (14 mg/hr Intravenous New Bag/Given 08/28/22 2129)  Oral care mouth rinse (15 mLs Mouth Rinse Given 08/28/22 2130)  Oral care mouth rinse (has no administration in time range)  Chlorhexidine Gluconate Cloth 2 % PADS 6 each (6 each Topical Given 08/28/22 2045)  sodium chloride flush (NS) 0.9 % injection 3 mL (3 mLs Intravenous Given 08/28/22 1746)  iohexol (OMNIPAQUE) 350 MG/ML injection 75 mL (75 mLs Intravenous Contrast Given 08/28/22 1801)  fentaNYL (SUBLIMAZE) 100 MCG/2ML injection (  Override pull for Anesthesia 08/28/22 1840)  iohexol (OMNIPAQUE) 300 MG/ML solution 100 mL (50 mLs Intra-arterial Contrast Given 08/28/22 2030)  clevidipine (CLEVIPREX) 0.5 MG/ML infusion (  Override pull for Anesthesia 08/28/22 2002)  iohexol (OMNIPAQUE) 300 MG/ML solution 50 mL (10 mLs Intra-arterial Contrast Given 08/28/22 2030)    ED Course/ Medical Decision Making/ A&P                          Medical Decision Making Amount and/or Complexity of Data Reviewed Labs: ordered. Decision-making details documented in ED Course. Radiology: ordered. Decision-making details documented in ED Course.  Risk Decision regarding hospitalization.    This patient presents to the ED for concern of acute CVA, this involves an extensive number of treatment options, and is a complaint that carries with it a high risk of complications and morbidity.  I considered the following differential and admission for this acute, potentially life threatening condition.   MDM:    Given the acute onset of neurological symptoms, stroke is the most concerning etiology of these acute symptoms. The neuro exam is significant for R-sided gaze preference, nonverbal, LUE  paresis w/ LLE weakness, L facial droop. Also consider other causes such as ICH, brain tumor, post-ictal state, hypoglycemia, electrolyte abnormalities.   Neurology team evaluating patient at bedside. Plan to obtain emergent CT brain.  LKN: 1500 Glucose: 151 mg/dL AC: +Eliquis BP: 025/852   Plan: - stat head CT, consider additional imaging including CTA and perfusion scan pending initial CT  - neurology evaluating at bedside - consider TNK but patient on eliquis - Manage hypertension as needed -- sBP goal 140-160 if ICH  Clinical Course as of 08/28/22 2312  Mon Aug 28, 2022  1802 Glucose-Capillary(!): 140 [HN]  1802 Creatinine(!): 1.50 [HN]  1802 Glucose(!): 133 [HN]  1824 LVO to thrombectomy [HN]  1824 CT HEAD CODE STROKE WO CONTRAST 1. Asymmetric hyperdensity in the right M1 segment concerning for acute thrombus. 2. Slight asymmetric hypoattenuation in the right lentiform nucleus and insular ribbon. This could represent early infarct. 3. Aspects is 8/10. 4. Stable mild periventricular white matter disease. This likely reflects the sequela of chronic microvascular ischemia.  The above was relayed via text pager to Dr. Rollene Fare on 08/28/2022 at 18:06 .   [HN]  1824 CT ANGIO HEAD NECK W WO CM (CODE STROKE) 1. Occlusion of the right internal carotid artery in its distal segment without reconstitution prior to the skull base. 2. The right ICA is occluded through the ICA terminus. Right M1 and A1 segments are occluded. 3. Minimal filling of distal right MCA branch vessels with some collaterals. 4. Atherosclerotic changes at the right carotid bifurcation and cavernous left internal carotid artery without significant stenosis. 5. Fetal type left posterior cerebral artery. 6. Probable pulmonary edema. 7.  Emphysema (ICD10-J43.9).   [HN]    Clinical Course User Index [HN] Loetta Rough, MD    Labs: I Ordered, and personally interpreted labs.  The pertinent results include:   those listed above  Imaging Studies ordered: I ordered imaging studies including CT, CTA I independently visualized and interpreted imaging. I agree with the radiologist interpretation  Additional history obtained from EMS, chart review.    Cardiac Monitoring: The patient was maintained on a cardiac monitor.  I personally viewed and interpreted the cardiac monitored which showed an underlying rhythm of: NSR  Social Determinants of Health: Per neurology, patient lives independently, manages her own medications. Has aide who assists with cleaning.   Disposition:  She was taken from the bridge to CT for stat CT and CT Angio.  CTA shows a right ICA occlusion and she was emergently taken to IR for a mechanical thrombectomy.   Co morbidities that complicate the patient evaluation  Past Medical History:  Diagnosis Date   Abnormal LFTs    a. in the past when on Lipitor   Anemia    as a young woman   Anxiety    Arnold-Chiari malformation, type I (HCC)    MRI brain 02/2010   CAD (coronary artery disease)    a. NSTEMI 07/2009: LHC - D2 40%, LAD irreg., EF 50% with apical AK (tako-tsubo CM);  b. Inf STEMI (04/2013): Tx Promus DES to CFX;  c. 08/2013 Lexi CL: No ischemia, dist ant/ap/inf-ap infarct, EF  d. 06/2016 NSTEMI with LM disease: s/p CABG on 07/17/2016 w/ LIMA-LAD, SVG-OM, and SVG-dRCA.   DEPRESSION    Dizziness    ? 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 anticoagulated with Eliquis 10/2014   Presence of permanent cardiac pacemaker 10/2014   leadless permanent pacemaker   RA (rheumatoid arthritis) (HCC)    prior tx by Dr. Dareen Piano    Sjogren's syndrome (HCC) 06/02/2013   pt denies this (12/01/13)   SLE (systemic lupus erythematosus) (HCC) dx 05/2013   follows with rheum Dareen Piano)   Stroke [name])    March 2023, weakness in right arm is improving   Tachy-brady syndrome (HCC)    a. 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   f/u echo 09/2009: EF 50-55%, mild LVH, mod diast dysfxn, mild apical HK   Wears dentures    Wears glasses      Medicines Meds ordered this encounter  Medications   sodium chloride flush (NS) 0.9 % injection 3 mL   iohexol (OMNIPAQUE) 350 MG/ML injection 75 mL   fentaNYL (SUBLIMAZE) 100 MCG/2ML injection    Val Eagle: cabinet override    stroke: early stages of recovery book   OR Linked Order Group    acetaminophen (TYLENOL) tablet 650 mg    acetaminophen (TYLENOL) 160 MG/5ML solution 650 mg    acetaminophen (TYLENOL) suppository 650 mg   senna-docusate (Senokot-S) tablet 1 tablet   enoxaparin (LOVENOX) injection 30 mg   iohexol (OMNIPAQUE) 300 MG/ML solution 100 mL   clevidipine (CLEVIPREX) 0.5 MG/ML infusion    Channing Mutters E: cabinet override   DISCONTD: acetaminophen (TYLENOL) tablet 650 mg   DISCONTD: acetaminophen (TYLENOL) 160 MG/5ML solution 650 mg   DISCONTD: acetaminophen (TYLENOL) suppository 650 mg   clevidipine (CLEVIPREX) infusion 0.5 mg/mL   iohexol (OMNIPAQUE) 300 MG/ML solution 50 mL   Oral care mouth rinse   Oral care mouth rinse   Chlorhexidine Gluconate Cloth 2 % PADS 6 each    I have reviewed the patients home medicines and have made adjustments as needed  Problem List / ED Course: Problem List Items Addressed This Visit       Cardiovascular and Mediastinum   * (Principal) Acute ischemic right ICA stroke (HCC) - Primary   Relevant Medications   enoxaparin (LOVENOX) injection 30 mg   clevidipine (CLEVIPREX) infusion 0.5 mg/mL  This note was created using dictation software, which may  contain spelling or grammatical errors.    Loetta Rough, MD 08/28/22 [phone]

## 2022-08-28 NOTE — ED Notes (Signed)
Pt transported to IR with RN

## 2022-08-28 NOTE — ED Triage Notes (Addendum)
Pt arrived via [facility] for Code stroke. LNW 1500, found by friend, on couch at time of arrival. Right sided gaze, non-verbal, left sided motor weakness. Takes Eliquis, unknown last dosage, manages medication by self. Hypertensive enroute. Bilateral AKA.  20g LAC 18g LAC  PTA EMS Vitals BP 220/100 HR 100 RR 20 SPO2 100% RA CBG 151

## 2022-08-28 NOTE — Anesthesia Postprocedure Evaluation (Signed)
Anesthesia Post Note  Patient: Mercedes [name]  Procedure(s) Performed: RADIOLOGY WITH ANESTHESIA     Patient location during evaluation: ICU Anesthesia Type: General Level of consciousness: patient cooperative and awake Pain management: pain level controlled Vital Signs Assessment: post-procedure vital signs reviewed and stable Respiratory status: spontaneous breathing and respiratory function stable Cardiovascular status: stable Postop Assessment: no apparent nausea or vomiting Anesthetic complications: no  No notable events documented.  Last Vitals:  Vitals:   08/28/22 2100 08/28/22 2115  BP: 137/84 135/77  Pulse: 81 76  Resp: 15 12  Temp:    SpO2: 99% 95%    Last Pain:  Vitals:   08/28/22 2045  TempSrc: Axillary                 Maybel Dambrosio

## 2022-08-28 NOTE — H&P (Signed)
Stroke Neurology Admission History & Physical   CC: Right gaze, left sided weakness  History is obtained from:EMS  HPI: Mercedes [name] is a 67 y.o. female with a past medical history of CAD, GERD, CVA, HTN, HLD, PAD with bilateral AKA, afib on eliquis, AICD placement, sjogren's syndrome, and cardiomyopathy who is presenting with right gaze deviation and left side weakness. She was last seen by family at 108 after she finished cooking for herself. She was taken from the bridge to CT for stat CT and CT Angio. CTA shows a right ICA occlusion and she was emergently taken to IR for a mechanical thrombectomy.  Family states that she is largely independent, manages her own medications, and is able to dress and bathe herself. She does have an aide who assists with her some cleaning. She is currently in PT and in the process of obtaining new prosthetics.  Notably her last admission was in March 2023 in the setting of medication nonadherence to Eliquis at which time she presented with right-sided weakness and speech difficulty, resolved per family   LKW: 1500 tpa given?: no, on eliquis Premorbid modified Rankin scale (mRS): 3-Moderate disability-requires help but walks WITHOUT assistance  NIR: Yes, R ICA occlusion. Consent obtained from daughter [facility] Mercedes [name])   ROS: Unable to obtain due to altered mental status.   Past Medical History:  Diagnosis Date   Abnormal LFTs    a. 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 NSTEMI 07/2009: LHC - D2 40%, LAD irreg., EF 50% with apical AK (tako-tsubo CM);  b. Inf STEMI (04/2013): Tx Promus DES to CFX;  c. 08/2013 Lexi CL: No ischemia, dist ant/ap/inf-ap infarct, EF  d. 06/2016 NSTEMI with LM disease: s/p CABG on 07/17/2016 w/ LIMA-LAD, SVG-OM, and SVG-dRCA.   DEPRESSION    Dizziness    ? CVA 01/2010 - carotid  dopplers with no ICA stenosis   GERD (gastroesophageal reflux disease)    Headache    History of transmetatarsal amputation of left foot ([place]) 07/17/2019   HYPERLIPIDEMIA    HYPERTENSION    MI (myocardial infarction) ([place])    PAD (peripheral artery disease) ([place])    s/p L fem pop 11/2013 in setting of 1st toe osteo/gangrene   Paroxysmal atrial fibrillation ([facility])    a. anticoagulated with Eliquis 10/2014   Presence of permanent cardiac pacemaker 10/2014   leadless permanent pacemaker   RA (rheumatoid arthritis) ([place])    prior tx by Dr. Ouida Sills   Sjogren's syndrome ([place]) 06/02/2013   pt denies this (12/01/13)   SLE (systemic lupus erythematosus) ([place]) dx 05/2013   follows with rheum Ouida Sills)   Stroke [facility])    March 2023, weakness in right arm is improving   Tachy-brady syndrome ([place])    a. 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 Her smoking use included cigarettes. She has never used smokeless tobacco. She reports that she does not currently use drugs after having used the following drugs: Marijuana and Other-see comments. She reports that she does not drink alcohol.  Medications No current facility-administered medications for this encounter.  Current Outpatient Medications:    acetaminophen (TYLENOL) 500 MG tablet, Take 500-1,000 mg by mouth every 8 (eight) hours as needed for mild pain or headache., Disp: , Rfl:    ELIQUIS 5 MG TABS tablet, TAKE 1 TABLET BY MOUTH TWICE  DAILY, Disp: 180 tablet, Rfl: 3    metoprolol tartrate (LOPRESSOR) 25 MG tablet, Take 0.5 tablets (12.5 mg total) by mouth 2 (two) times daily., Disp: 180 tablet, Rfl: 1   nitroGLYCERIN (NITROSTAT) 0.4 MG SL tablet, Place 1 tablet (0.4 mg total) under the tongue every 5 (five) minutes as needed for chest pain., Disp: 25 tablet, Rfl: 11   rosuvastatin (CRESTOR) 40 MG tablet, Take 1 tablet (40 mg total) by mouth daily., Disp: 90 tablet, Rfl: 3   Exam: Current vital signs: BP (!) 215/101 (BP Location: Right Arm)   Pulse 79   Wt 43.4 kg   SpO2 100%   BMI 15.92 kg/m  Vital signs in last 24 hours: Pulse Rate:  [79] 79 (01/01 1747) BP: (215)/(101) 215/101 (01/01 1747) SpO2:  [100 %] 100 % (01/01 1747) Weight:  [43.4 kg] 43.4 kg (01/01 1700)  GENERAL:Eyes open, ill appearing HEENT: - Normocephalic and atraumatic, dry mm, no LN++, no Thyromegally LUNGS - Clear to auscultation bilaterally with no wheezes CV - S1S2 RRR, no m/r/g, equal pulses bilaterally. ABDOMEN - Soft, nontender, nondistended with normoactive BS Ext: warm, well perfused upper extremities. Bilateral AKA  NEURO:  Mental Status: Eyes open, awake. Does not follow commands Language: speech is absent, globally aphasic.   Cranial Nerves: Right gaze deviation, does not cross midline, blinks to threat on the right, left facial droop, head is to the right but can come to midline.  Motor: Spontaneously moving the right upper extremity and gesturing a lot with that but not in a meaningful fashion.  Spontaneously moving bilateral lower extremities right side more than left.  Plegic in the left upper extremity Sensation-reactive to noxious stim in all 4 extremities  NIHSS 1a Level of Conscious.: 0 1b LOC Questions: 2 1c LOC Commands: 0 2 Best Gaze: 2 3 Visual: 3 4 Facial Palsy: 1 5a Motor Arm - left: 4 5b Motor Arm - Right: 0 6a Motor Leg - Left: 2 6b Motor Leg - Right: 0 7 Limb Ataxia: 0 8 Sensory: 0 9 Best Language: 3 10 Dysarthria: 2 11 Extinct. and  Inatten.: 0 TOTAL: 19   Labs I have reviewed labs in epic and the results pertinent to this consultation are:  CBC    Component Value Date/Time   WBC 7.5 08/28/2022 1738   RBC 5.46 (H) 08/28/2022 1738   HGB 14.6 08/28/2022 1742   HGB 14.2 10/09/2018 1125   HCT 43.0 08/28/2022 1742   HCT 44.6 10/09/2018 1125   PLT 315 08/28/2022 1738   PLT 289 10/09/2018 1125   MCV 73.6 (L) 08/28/2022 1738   MCV 79 10/09/2018 1125   MCH 24.0 (L) 08/28/2022 1738   MCHC 32.6 08/28/2022 1738   RDW 17.7 (H) 08/28/2022 1738   RDW 14.8 10/09/2018  1125   LYMPHSABS 2.9 08/28/2022 1738   MONOABS 0.6 08/28/2022 1738   EOSABS 0.3 08/28/2022 1738   BASOSABS 0.1 08/28/2022 1738    CMP     Component Value Date/Time   NA 135 08/28/2022 1742   NA 135 10/09/2018 1125   K 4.7 08/28/2022 1742   CL 105 08/28/2022 1742   CO2 26 07/05/2022 1123   GLUCOSE 133 (H) 08/28/2022 1742   BUN 22 08/28/2022 1742   BUN 10 10/09/2018 1125   CREATININE 1.50 (H) 08/28/2022 1742   CREATININE 1.13 (H) 08/03/2016 1440   CALCIUM 9.4 07/05/2022 1123   PROT 7.9 07/05/2022 1123   PROT 8.1 04/01/2018 0000   ALBUMIN 3.6 07/05/2022 1123   ALBUMIN 4.1 04/01/2018 0000   AST 17 07/05/2022 1123   ALT 9 07/05/2022 1123   ALKPHOS 105 07/05/2022 1123   BILITOT 0.4 07/05/2022 1123   BILITOT 0.4 04/01/2018 0000   GFRNONAA >60 03/16/2022 0149   GFRAA >60 11/24/2019 0620    Lipid Panel     Component Value Date/Time   CHOL 160 07/05/2022 1123   CHOL 168 04/01/2018 0000   TRIG 99.0 07/05/2022 1123   HDL 52.90 07/05/2022 1123   HDL 54 04/01/2018 0000   CHOLHDL 3 07/05/2022 1123   VLDL 19.8 07/05/2022 1123   LDLCALC 87 07/05/2022 1123   LDLCALC 92 04/01/2018 0000     Imaging I have reviewed the images obtained:  CT-head 1. Asymmetric hyperdensity in the right M1 segment concerning for acute thrombus. 2. Slight asymmetric hypoattenuation in the right lentiform nucleus and insular ribbon. This could represent early  infarct. 3. Aspects is 8/10. 4. Stable mild periventricular white matter disease. This likely reflects the sequela of chronic microvascular ischemia.  CTA Head and Neck 1. Occlusion of the right internal carotid artery in its distal segment without reconstitution prior to the skull base. 2. The right ICA is occluded through the ICA terminus. Right M1 and A1 segments are occluded. 3. Minimal filling of distal right MCA branch vessels with some collaterals. 4. Atherosclerotic changes at the right carotid bifurcation and cavernous left internal carotid artery without significant stenosis. 5. Fetal type left posterior cerebral artery. 6. Probable pulmonary edema. 7.  Emphysema (ICD10-J43.9).  Assessment:  67 year old female with a past medical history of atrial fibrillation who is thought to be compliant with her eliquis, but does have a significant cardiac and vascular history presenting with right gaze deviation and a left sided weakness. CTA Head and Neck confirms a R ICA occlusion extending to the R M1 and R A1.  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 -Hyperglycemia management per SSI to maintain glucose 140-180mg /dL. -PT/OT/ST therapies and recommendations when able  CNS -Close neuro monitoring  Dysarthria Dysphagia following cerebral infarction  -NPO until cleared by speech -ST -PT/OT  RESP Intubated for IR  - Extubate per anesthesia plan or PCCM   CV Essential (primary) hypertension -Aggressive BP control, goal SBP < 220 or per Neuro IR based on  results of intervention -Titrate oral agents -TTE  Hyperlipidemia, unspecified  - Statin for goal LDL < 70  Chronic atrial fibrillation -Rate control -Hold Eliquis for now pending procedure, start date to be determined based on postprocedure imaging  HEME Anemia in Chronic Diseases -Monitor -transfuse for hgb < 7  Coagulopathy secondary to anticoagulation- on Eliquis, unclear adherence  ENDO -goal HgbA1c < 7  GI/GU -Gentle hydration  Fluid/Electrolyte Disorders -Replete -Repeat labs -Trend -Per dialysis  ID -CXR -NPO -Monitor  Nutrition NPO  Prophylaxis DVT:  Subq heparin/lovenox GI: PPI Bowel: Senna  Diet: NPO until cleared by speech  Code Status: Full Code    THE FOLLOWING WERE PRESENT ON ADMISSION: CNS -  Acute Ischemic Stroke Cardiovascular - HTN, HLD Extremities- Bil AKA, PAD  -- Patient seen and examined by NP/APP with MD. MD to update note as needed.   Elmer Picker, DNP, FNP-BC [facility] Pager: [phone]  Attending Neurologist's note:  I personally saw this patient, gathering history, performing a full neurologic examination, reviewing relevant labs, personally reviewing relevant imaging including head CT, CTA head and neck, and formulated the assessment and plan, adding the note above for completeness and clarity to accurately reflect my thoughts  CRITICAL CARE Performed by: Gordy Councilman   Total critical care time: 35 minutes  Critical care time was exclusive of separately billable procedures and treating other patients.  Critical care was necessary to treat or prevent imminent or life-threatening deterioration.  Critical care was time spent personally by me on the following activities: development of treatment plan with patient and/or surrogate as well as nursing, discussions with consultants, evaluation of patient's response to treatment, examination of patient, obtaining history from patient or surrogate,  ordering and performing treatments and interventions, ordering and review of laboratory studies, ordering and review of radiographic studies, pulse oximetry and re-evaluation of patient's condition.

## 2022-08-28 NOTE — Anesthesia Procedure Notes (Signed)
Procedure Name: Intubation Date/Time: 08/28/2022 6:30 PM  Performed by: Lavell Luster, CRNAPre-anesthesia Checklist: Patient identified, Emergency Drugs available, Suction available, Patient being monitored and Timeout performed Patient Re-evaluated:Patient Re-evaluated prior to induction Oxygen Delivery Method: Circle system utilized Preoxygenation: Pre-oxygenation with 100% oxygen Induction Type: IV induction Ventilation: Mask ventilation without difficulty Laryngoscope Size: Mac and 3 Grade View: Grade II Tube type: Oral Number of attempts: 1 Airway Equipment and Method: Stylet Placement Confirmation: ETT inserted through vocal cords under direct vision, positive ETCO2 and breath sounds checked- equal and bilateral Secured at: 22 cm Tube secured with: Tape Dental Injury: Teeth and Oropharynx as per pre-operative assessment

## 2022-08-28 NOTE — Procedures (Signed)
INTERVENTIONAL NEURORADIOLOGY BRIEF POSTPROCEDURE NOTE  DIAGNOSTIC CEREBRAL ANGIOGRAM AND MECHANICAL THROMBECTOMY  Attending: Dr. Pedro Earls  Diagnosis: Right ICA terminus occlusion  Access site: RCFA (occlusion of the RFA below the level of puncture seen on ultrasound prior to puncture - pt has mid thigh amputation).  Access closure: 8 [place] angio-seal   Anesthesia: GETA  Medication used: refer to anesthesia documentation.  Complications: None  Estimated blood loss: 100 mL  Specimen: None.  Findings: Occlusion of the left ICA terminus identified. Mechanical thrombectomy performed with direct contact aspiration and combined aspiration and stent retriever in the ICA (x1), MCA (x2) and ACA (x2) achieving complete recanalization. Flap panel CT showed contrast staining in the right basal ganglia.  The patient tolerated the procedure well without incident or complication and is in stable condition.   PLAN:  - bed rest x 6 hours - SBP 120-140 mmHg

## 2022-08-28 NOTE — Transfer of Care (Signed)
Immediate Anesthesia Transfer of Care Note  Patient: Mercedes [name]  Procedure(s) Performed: RADIOLOGY WITH ANESTHESIA  Patient Location: ICU  Anesthesia Type:General  Level of Consciousness: drowsy  Airway & Oxygen Therapy: Patient Spontanous Breathing and Patient connected to nasal cannula oxygen  Post-op Assessment: Report given to RN, Post -op Vital signs reviewed and stable, and Patient moving all extremities  Post vital signs: Reviewed and stable  Last Vitals:  Vitals Value Taken Time  BP 150/92 08/28/22 2039  Temp 36.6 C 08/28/22 2045  Pulse 82 08/28/22 2055  Resp 12 08/28/22 2055  SpO2 98 % 08/28/22 2055  Vitals shown include unvalidated device data.  Last Pain:  Vitals:   08/28/22 2045  TempSrc: Axillary         Complications: No notable events documented.

## 2022-08-28 NOTE — Anesthesia Preprocedure Evaluation (Signed)
Anesthesia Evaluation   Patient confused    Reviewed: Unable to perform ROS - Chart review onlyPreop documentation limited or incomplete due to emergent nature of procedure.  Airway Mallampati: Unable to assess       Dental   Pulmonary former smoker   breath sounds clear to auscultation       Cardiovascular hypertension, Pt. on medications + CAD, + Past MI, + CABG and + Peripheral Vascular Disease   Rhythm:Regular   Unchanged hazy lesions along proximal margin of proximal LCx stent protruding into the LMCA. This results in 40-50% distal LMCA and 70-80% ostial LAD stenoses.  Intravascular ultrasound of LAD and LMCA demonstrates focal stenosis involving distal LMCA and ostial LAD with MLA of 4.5 mm^2. The plaque morphology appears most consistent with fibrosis and calcification rather than thrombus.   Recommendations: 1. Return to 4N for medical management.  Will discontinue tirofiban and initiate heparin infusion, given ACS and history of atrial fibrillation, 4 hours after TR band deflation. 2. Cardiac surgery consultation to discuss surgical revascularization.  LMCA to LAD PCI could be performed if the patient is felt to be inappropriate for CABG, though given existing LCx stent protruding into LMCA, though would be technically challenging and potentially high risk.    1. Left ventricular ejection fraction, by estimation, is 55 to 60%. The  left ventricle has normal function. The left ventricle has no regional  wall motion abnormalities. Left ventricular diastolic function could not  be evaluated.   2. Right ventricular systolic function is normal. The right ventricular  size is normal. There is normal pulmonary artery systolic pressure. The  estimated right ventricular systolic pressure is 84.6 mmHg.   3. The mitral valve is grossly normal. Trivial mitral valve  regurgitation. No evidence of mitral stenosis.   4. The aortic valve  is grossly normal. Aortic valve regurgitation is not  visualized. No aortic stenosis is present.   5. The inferior vena cava is normal in size with greater than 50%  respiratory variability, suggesting right atrial pressure of 3 mmHg.      Neuro/Psych  Headaches, Seizures -,  PSYCHIATRIC DISORDERS Anxiety Depression     Neuromuscular disease CVA, Residual Symptoms    GI/Hepatic ,GERD  ,,  Endo/Other  diabetes    Renal/GU Renal InsufficiencyRenal diseaseLab Results      Component                Value               Date                      CREATININE               1.50 (H)            08/28/2022                Musculoskeletal  (+) Arthritis ,    Abdominal   Peds  Hematology Lab Results      Component                Value               Date                      WBC                      7.5  08/28/2022                HGB                      14.6                08/28/2022                HCT                      43.0                08/28/2022                MCV                      73.6 (L)            08/28/2022                PLT                      315                 08/28/2022              Anesthesia Other Findings   Reproductive/Obstetrics                             Anesthesia Physical Anesthesia Plan  ASA: 4 and emergent  Anesthesia Plan: General   Post-op Pain Management:    Induction: Intravenous, Rapid sequence and Cricoid pressure planned  PONV Risk Score and Plan: 3  Airway Management Planned: Oral ETT  Additional Equipment:   Intra-op Plan:   Post-operative Plan: Possible Post-op intubation/ventilation  Informed Consent:      History available from chart only and Only emergency history available  Plan Discussed with: CRNA and Surgeon  Anesthesia Plan Comments:        Anesthesia Quick Evaluation

## 2022-08-28 NOTE — Code Documentation (Signed)
Stroke Response Nurse Documentation Code Documentation  Mercedes [name] is a 67 y.o. female arriving to [facility]  via [facility] on 08-28-2022 with past medical hx of PVD, CAD, AF, DM, bilat AKA  . On Eliquis (apixaban) daily. Code stroke was activated by EMS.   Patient from home where she was LKW at 1500 and now complaining of Righ gaze, non verbal, Left arm weakness Stroke team at the bedside on patient arrival. Labs drawn and patient cleared for CT by [place] PA. Patient to CT with team. NIHSS 19, see documentation for details and code stroke times. Patient with disoriented, right gaze preference , left hemianopia, left facial droop, left arm weakness, Expressive aphasia , and dysarthria  on exam. The following imaging was completed:  CT Head and CTA. Patient is not a candidate for IV Thrombolytic due to being on Eliquis. Patient is a candidate for IR.  Patient transported to IR   Care Plan: admit to ICU post IR.   Bedside handoff with IR RN Elta [place].    Raliegh Ip  Stroke Response RN

## 2022-08-29 ENCOUNTER — Inpatient Hospital Stay (HOSPITAL_COMMUNITY): Payer: Medicare HMO

## 2022-08-29 ENCOUNTER — Encounter (HOSPITAL_COMMUNITY): Payer: Self-pay | Admitting: Radiology

## 2022-08-29 DIAGNOSIS — I63231 Cerebral infarction due to unspecified occlusion or stenosis of right carotid arteries: Secondary | ICD-10-CM | POA: Diagnosis not present

## 2022-08-29 DIAGNOSIS — I5030 Unspecified diastolic (congestive) heart failure: Secondary | ICD-10-CM

## 2022-08-29 LAB — ECHOCARDIOGRAM COMPLETE
Area-P 1/2: 3.17 cm2
MV M vel: 0.83 m/s
MV Peak grad: 2.7 mmHg
S' Lateral: 3.5 cm
Weight: 1530.87 oz

## 2022-08-29 LAB — LIPID PANEL
Cholesterol: 144 mg/dL (ref 0–200)
HDL: 44 mg/dL (ref >40)
LDL Cholesterol: 82 mg/dL (ref 0–99)
Total CHOL/HDL Ratio: 3.3 RATIO
Triglycerides: 88 mg/dL (ref <150)
VLDL: 18 mg/dL (ref 0–40)

## 2022-08-29 MED ORDER — ORAL CARE MOUTH RINSE: 15.0000 mL | OROMUCOSAL | Status: DC | PRN

## 2022-08-29 MED ORDER — ONDANSETRON HCL 4 MG/2ML IJ SOLN: 4.0000 mg | Freq: Four times a day (QID) | INTRAMUSCULAR | Status: DC | PRN

## 2022-08-29 MED ORDER — PERFLUTREN LIPID MICROSPHERE
1.0000 mL | INTRAVENOUS | Status: AC | PRN
  Administered 2022-08-29: 2 mL via INTRAVENOUS

## 2022-08-29 NOTE — Evaluation (Signed)
Physical Therapy Evaluation Patient Details Name: Mercedes [name] MRN: [id number] DOB: 03-13-1956 Today's Date: 08/29/2022  History of Present Illness  67 yo s/p 08/28/22 R ICA occlusion with thrombectomy NIH 19 PMH L frontal CVA B AKA, takotsubo CM, SLE, RA, PAD, MI, HTN, CAD.   Clinical Impression  Pt admitted with above diagnosis. PTA pt lived at home alone with PCA 2 hrs/day, mod I at w/c level with R prosthesis. Pt currently with functional limitations due to the deficits listed below (see PT Problem List). On eval, pt required +2 max assist supine to sit, and max assist to maintain balance EOB. No active movement noted LUE and L residual limb. L facial droop noted. Primarily nonverbal due to dysarthria. Able to appropriately nod yes/no and follow commands. Attempted to vocalize but speech is very slurred. When therapist assisting pt with yankauer, pt able to say "show me how" in regarding to using the yankauer herself. Pt will benefit from skilled PT to increase their independence and safety with mobility to allow discharge to the venue listed below.          Recommendations for follow up therapy are one component of a multi-disciplinary discharge planning process, led by the attending physician.  Recommendations may be updated based on patient status, additional functional criteria and insurance authorization.  Follow Up Recommendations Skilled nursing-short term rehab (<3 hours/day) Can patient physically be transported by private vehicle: No    Assistance Recommended at Discharge Frequent or constant Supervision/Assistance  Patient can return home with the following  Assist for transportation;Two people to help with bathing/dressing/bathroom;Two people to help with walking and/or transfers;Help with stairs or ramp for entrance    Equipment Recommendations Hospital bed;Other (comment) (hoyer lift)  Recommendations for Other Services       Functional Status Assessment        Precautions / Restrictions Precautions Precautions: Fall Precaution Comments: SBP 120-140 Restrictions Other Position/Activity Restrictions: h/o B AKA      Mobility  Bed Mobility Overal bed mobility: Needs Assistance Bed Mobility: Supine to Sit, Sit to Supine     Supine to sit: Max assist, +2 for physical assistance Sit to supine: Max assist, +2 for physical assistance   General bed mobility comments: Pt able to initiate movement. Assist to elevate trunk. Bed pad to pivot to EOB.    Transfers                   General transfer comment: unable to progress beyond EOB    Ambulation/Gait               General Gait Details: nonambulatory at baseline  Stairs            Wheelchair Mobility    Modified Rankin (Stroke Patients Only) Modified Rankin (Stroke Patients Only) Pre-Morbid Rankin Score: Moderately severe disability Modified Rankin: Severe disability     Balance Overall balance assessment: Needs assistance Sitting-balance support: Single extremity supported Sitting balance-Leahy Scale: Zero Sitting balance - Comments: max assist to maintain EOB balance Postural control: Posterior lean, Left lateral lean                                   Pertinent Vitals/Pain Pain Assessment Pain Assessment: Faces Faces Pain Scale: No hurt    Home Living Family/patient expects to be discharged to:: Private residence Living Arrangements: Alone Available Help at Discharge: Family;Available PRN/intermittently;Personal care attendant Type of Home:  House Home Access: Ramped entrance       Home Layout: One level Home Equipment: Wheelchair - manual;Tub bench;Wheelchair - power;BSC/3in1 Additional Comments: aide 2 hours a day    Prior Function Prior Level of Function : Independent/Modified Independent             Mobility Comments: has prosthetic for bil LE but reports not wearing L LE only R LE       Hand Dominance   Dominant  Hand: Right    Extremity/Trunk Assessment   Upper Extremity Assessment Upper Extremity Assessment: Defer to OT evaluation    Lower Extremity Assessment Lower Extremity Assessment: LLE deficits/detail;RLE deficits/detail RLE Deficits / Details: AROM WFL LLE Deficits / Details: no active movement noted, senstion appears intact       Communication   Communication: Expressive difficulties  Cognition Arousal/Alertness: Lethargic Behavior During Therapy: Flat affect Overall Cognitive Status: Difficult to assess                                 General Comments: Primarily non-verbal. Able to approrpriately nod yes/no and give thumbs up/thumbs down. Following commands consistently. Able to write her name.        General Comments General comments (skin integrity, edema, etc.): VSS on 2L. BP maintained within parameters    Exercises     Assessment/Plan    PT Assessment Patient needs continued PT services  PT Problem List Decreased strength;Decreased balance;Decreased mobility;Decreased activity tolerance       PT Treatment Interventions Functional mobility training;Balance training;Patient/family education;Therapeutic activities;Neuromuscular re-education;Therapeutic exercise    PT Goals (Current goals can be found in the Care Plan section)  Acute Rehab PT Goals Patient Stated Goal: not stated PT Goal Formulation: With patient Time For Goal Achievement: 09/12/22 Potential to Achieve Goals: Fair    Frequency Min 3X/week     Co-evaluation PT/OT/SLP Co-Evaluation/Treatment: Yes Reason for Co-Treatment: Complexity of the patient's impairments (multi-system involvement);Necessary to address cognition/behavior during functional activity;To address functional/ADL transfers;For patient/therapist safety PT goals addressed during session: Mobility/safety with mobility;Balance OT goals addressed during session: ADL's and self-care;Strengthening/ROM       AM-PAC PT  "6 Clicks" Mobility  Outcome Measure Help needed turning from your back to your side while in a flat bed without using bedrails?: Total Help needed moving from lying on your back to sitting on the side of a flat bed without using bedrails?: Total Help needed moving to and from a bed to a chair (including a wheelchair)?: Total Help needed standing up from a chair using your arms (e.g., wheelchair or bedside chair)?: Total Help needed to walk in hospital room?: Total Help needed climbing 3-5 steps with a railing? : Total 6 Click Score: 6    End of Session Equipment Utilized During Treatment: Oxygen Activity Tolerance: Patient limited by fatigue;Patient limited by lethargy Patient left: in bed;with call bell/phone within reach Nurse Communication: Mobility status PT Visit Diagnosis: Other abnormalities of gait and mobility (R26.89);Hemiplegia and hemiparesis Hemiplegia - Right/Left: Left Hemiplegia - dominant/non-dominant: Non-dominant Hemiplegia - caused by: Cerebral infarction    Time: 2229-7989 PT Time Calculation (min) (ACUTE ONLY): 27 min   Charges:   PT Evaluation $PT Eval Moderate Complexity: 1 Mod          Lorrin Goodell, PT  Office # [phone] Pager [phone]   Lorriane Shire 08/29/2022, 11:01 AM

## 2022-08-29 NOTE — Evaluation (Signed)
Clinical/Bedside Swallow Evaluation Patient Details  Name: Mercedes [name] MRN: [id number] Date of Birth: 10/24/1955  Today's Date: 08/29/2022 Time: SLP Start Time (ACUTE ONLY): 1044 SLP Stop Time (ACUTE ONLY): 1055 SLP Time Calculation (min) (ACUTE ONLY): 11 min  Past Medical History:  Past Medical History:  Diagnosis Date   Abnormal LFTs    a. in the past when on Lipitor   Anemia    as a young woman   Anxiety    Arnold-Chiari malformation, type I (HCC)    MRI brain 02/2010   CAD (coronary artery disease)    a. NSTEMI 07/2009: LHC - D2 40%, LAD irreg., EF 50% with apical AK (tako-tsubo CM);  b. Inf STEMI (04/2013): Tx Promus DES to CFX;  c. 08/2013 Lexi CL: No ischemia, dist ant/ap/inf-ap infarct, EF  d. 06/2016 NSTEMI with LM disease: s/p CABG on 07/17/2016 w/ LIMA-LAD, SVG-OM, and SVG-dRCA.   DEPRESSION    Dizziness    ? 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 anticoagulated with Eliquis 10/2014   Presence of permanent cardiac pacemaker 10/2014   leadless permanent pacemaker   RA (rheumatoid arthritis) (HCC)    prior tx by Dr. Dareen Piano   Sjogren's syndrome (HCC) 06/02/2013   pt denies this (12/01/13)   SLE (systemic lupus erythematosus) (HCC) dx 05/2013   follows with rheum Dareen Piano)   Stroke [facility])    March 2023, weakness in right arm is improving   Tachy-brady syndrome (HCC)    a. 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   f/u echo 09/2009: EF 50-55%, mild LVH, mod diast dysfxn, mild apical HK   Wears dentures    Wears glasses    Past Surgical History:  Past Surgical History:   Procedure Laterality Date   ABDOMINAL AORTAGRAM N/A 11/24/2013   Procedure: ABDOMINAL AORTAGRAM WITH BILATERAL RUNOFF WITH POSSIBLE INTERVENTION;  Surgeon: Chuck Hint, MD;  Location: [facility] CATH LAB;  Service: Cardiovascular;  Laterality: N/A;   ABDOMINAL AORTOGRAM W/LOWER EXTREMITY N/A 04/25/2019   Procedure: ABDOMINAL AORTOGRAM W/LOWER EXTREMITY;  Surgeon: Sherren Kerns, MD;  Location: [facility] INVASIVE CV LAB;  Service: Cardiovascular;  Laterality: N/A;   ABDOMINAL AORTOGRAM W/LOWER EXTREMITY Bilateral 05/15/2019   Procedure: ABDOMINAL AORTOGRAM W/LOWER EXTREMITY;  Surgeon: Nada Libman, MD;  Location: [facility] INVASIVE CV LAB;  Service: Cardiovascular;  Laterality: Bilateral;   ABDOMINAL AORTOGRAM W/LOWER EXTREMITY N/A 08/23/2020   Procedure: ABDOMINAL AORTOGRAM W/LOWER EXTREMITY;  Surgeon: Maeola Harman, MD;  Location: [facility] INVASIVE CV LAB;  Service: Cardiovascular;  Laterality: N/A;   ABDOMINAL AORTOGRAM W/LOWER EXTREMITY N/A 10/29/2020   Procedure: ABDOMINAL AORTOGRAM W/LOWER EXTREMITY;  Surgeon: Sherren Kerns, MD;  Location: [facility] INVASIVE CV LAB;  Service: Cardiovascular;  Laterality: N/A;   AMPUTATION Left 12/02/2013   Procedure: LEFT 1ST TOE AMPUTATION;  Surgeon: Sherren Kerns, MD;  Location: [facility] OR;  Service: Vascular;  Laterality: Left;   AMPUTATION Left 07/16/2019   Procedure: LEFT TRANSMETATARSAL AMPUTATION;  Surgeon: Nadara Mustard, MD;  Location: [facility] OR;  Service: Orthopedics;  Laterality: Left;   AMPUTATION  Left 10/01/2019   Procedure: LEFT AMPUTATION ABOVE KNEE;  Surgeon: Elam Dutch, MD;  Location: [place];  Service: Vascular;  Laterality: Left;   AMPUTATION Right 08/31/2020   Procedure: Right first toe amputation;  Surgeon: Elam Dutch, MD;  Location: [place];  Service: Vascular;  Laterality: Right;   AMPUTATION Right 11/23/2020   Procedure: RIGHT AMPUTATION ABOVE KNEE;  Surgeon: Elam Dutch, MD;  Location: [place];  Service: Vascular;  Laterality: Right;    AMPUTATION Left 03/14/2022   Procedure: REVISION LEFT ABOVE KNEE AMPUTATION;  Surgeon: Waynetta Sandy, MD;  Location: [place];  Service: Vascular;  Laterality: Left;   APPLICATION OF WOUND VAC Left 11/03/2019   Procedure: Application Of Wound Vac, left lower extremity;  Surgeon: Elam Dutch, MD;  Location: [place];  Service: Vascular;  Laterality: Left;   CARDIAC CATHETERIZATION N/A 07/12/2016   Procedure: Left Heart Cath and Coronary Angiography;  Surgeon: Troy Sine, MD;  Location: [place] CV LAB;  Service: Cardiovascular;  Laterality: N/A;   CARDIAC CATHETERIZATION N/A 07/14/2016   Procedure: Intravascular Ultrasound/IVUS;  Surgeon: Nelva Bush, MD;  Location: [place] CV LAB;  Service: Cardiovascular;  Laterality: N/A;   COLONOSCOPY     CORONARY ANGIOPLASTY  04/2013   CORONARY ARTERY BYPASS GRAFT N/A 07/17/2016   Procedure: CORONARY ARTERY BYPASS GRAFTING (CABG)x3 using right greater saphenous vein harvested endoscopiclly and left internal mammary artery;  Surgeon: Ivin Poot, MD;  Location: [place];  Service: Open Heart Surgery;  Laterality: N/A;   ENDARTERECTOMY FEMORAL Left 05/12/2019   Procedure: LEFT Femoral Endarterectomy;  Surgeon: Elam Dutch, MD;  Location: [place];  Service: Vascular;  Laterality: Left;   FEMORAL-POPLITEAL BYPASS GRAFT Left 12/02/2013   Procedure:  LEFT FEMORAL-BELOW KNEE POPLITEAL ARTERY BYPASS GRAFT;  Surgeon: Elam Dutch, MD;  Location: [place];  Service: Vascular;  Laterality: Left;   FEMORAL-POPLITEAL BYPASS GRAFT Left 09/25/2019   Procedure: THROMBECTOMY and PROPATEN BYPASS  FEMORAL TO BELOW KNEE POPLITEAL ARTERY BYPASS GRAFT LEFT LEG;  Surgeon: [name], MD;  Location: [place];  Service: Vascular;  Laterality: Left;   FEMORAL-POPLITEAL BYPASS GRAFT Right 08/24/2020   Procedure: RIGHT FEMORAL-BELOW KNEE POPLITEAL ARTERY BYPASS USING GORE PROPATEN GRAFT;  Surgeon: Waynetta Sandy, MD;  Location: [place];  Service:  Vascular;  Laterality: Right;   FEMORAL-POPLITEAL BYPASS GRAFT Right 11/01/2020   Procedure: Thrombectomy and Revision of Right Femoral to below knee Bypass with Interpostional graft to Tibial / peroneal Trunk and Endarterectomy Tibial Letitia Caul Trunk.;  Surgeon: Elam Dutch, MD;  Location: [facility] OR;  Service: Vascular;  Laterality: Right;   INTRAOPERATIVE ARTERIOGRAM Right 11/01/2020   Procedure: ANTEGRADE RIGHT LOWER EXTREMITY INTRA OPERATIVE ARTERIOGRAM;  Surgeon: Elam Dutch, MD;  Location: [facility] OR;  Service: Vascular;  Laterality: Right;   LEFT HEART CATHETERIZATION WITH CORONARY ANGIOGRAM N/A 05/19/2013   Procedure: LEFT HEART CATHETERIZATION WITH CORONARY ANGIOGRAM;  Surgeon: Blane Ohara, MD;  Location: [facility] CATH LAB;  Service: Cardiovascular;  Laterality: N/A;   LEFT HEART CATHETERIZATION WITH CORONARY ANGIOGRAM N/A 10/28/2014   Procedure: LEFT HEART CATHETERIZATION WITH CORONARY ANGIOGRAM;  Surgeon: Wellington Hampshire, MD;  Location: [place] CATH LAB;  Service: Cardiovascular;  Laterality: N/A;   LOWER EXTREMITY ANGIOGRAM Left 05/12/2019   Procedure: Lower Extremity Angiogram;  Surgeon: Elam Dutch, MD;  Location: [facility] OR;  Service: Vascular;  Laterality: Left;   PATCH ANGIOPLASTY Left 05/12/2019   Procedure: Left Femoral Patch Angioplasty USING CADAVERIC  SAPHENOUS VEIN;  Surgeon: Sherren Kerns, MD;  Location: [facility] OR;  Service: Vascular;  Laterality: Left;   PERCUTANEOUS CORONARY STENT INTERVENTION (PCI-S)  05/19/2013   Procedure: PERCUTANEOUS CORONARY STENT INTERVENTION (PCI-S);  Surgeon: Micheline Chapman, MD;  Location: [facility] CATH LAB;  Service: Cardiovascular;;   PERIPHERAL VASCULAR INTERVENTION Right 10/29/2020   Procedure: PERIPHERAL VASCULAR INTERVENTION;  Surgeon: Sherren Kerns, MD;  Location: [facility] INVASIVE CV LAB;  Service: Cardiovascular;  Laterality: Right;  common iliac   PERMANENT PACEMAKER INSERTION N/A 11/04/2014   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    RADIOLOGY WITH ANESTHESIA N/A 08/28/2022   Procedure: RADIOLOGY WITH ANESTHESIA;  Surgeon: Radiologist, Medication, MD;  Location: [facility] OR;  Service: Radiology;  Laterality: N/A;   TEE WITHOUT CARDIOVERSION N/A 07/17/2016   Procedure: TRANSESOPHAGEAL ECHOCARDIOGRAM (TEE);  Surgeon: Kerin Perna, MD;  Location: [facility] OR;  Service: Open Heart Surgery;  Laterality: N/A;   THROMBECTOMY FEMORAL ARTERY Left 05/12/2019   Procedure: Left Ilio-Femoral Artery and Femoral-popliteal Graft Thrombectomy;  Surgeon: Sherren Kerns, MD;  Location: [facility] OR;  Service: Vascular;  Laterality: Left;   THROMBECTOMY OF BYPASS GRAFT FEMORAL- POPLITEAL ARTERY Right 11/01/2020   Procedure: POSSIBLE THROMBOLYSIS VERSUS OPEN THROMBECTOMY AND REVSION OF RIGHT FEMORAL-POPLITEAL ARTERY BYPASS;  Surgeon: Sherren Kerns, MD;  Location: [facility] OR;  Service: Vascular;  Laterality: Right;   TUBAL LIGATION     VISCERAL ANGIOGRAPHY N/A 04/25/2019   Procedure: MESENTRIC  ANGIOGRAPHY;  Surgeon: Sherren Kerns, MD;  Location: [facility] INVASIVE CV LAB;  Service: Cardiovascular;  Laterality: N/A;   WOUND DEBRIDEMENT Left 11/03/2019   Procedure: Debridement Wound of left above the knee amputation;  Surgeon: Sherren Kerns, MD;  Location: [facility] OR;  Service: Vascular;  Laterality: Left;   HPI:  67 yo presenting 08/28/22 with L sided weakness, not speaking, R gaze preference. 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 Pt was evaluated by SLP in March 2023 and found to have mild-moderate dysarthria; cognitive-linguistic function and swallowing WFL. PMH includes: L frontal CVA, GERD, sjogren's syndrome, B AKA, takotsubo CM, SLE, RA, PAD, MI, HTN, CAD, anxiety, depression, chiari malformation    Assessment / Plan / Recommendation  Clinical Impression  Pt has impaired management of secretions, with saliva pooling in her mouth and pouring out of her R side, to which she was leaning, upon SLP arrival. She can initiate a swallow volitionally, but her  volitional cough is very weak. She has coughing on her saliva, but is starting to use her yankauer with cues to better clear her oral cavity. During oral motor exam, pt has reduced ROM bilaterally although on L side more than R. She has a noticeable L facial droop, but in general says that she can't perform a lot of tasks due to pain. She does not protrude her tongue at all. Recommend that pt remain NPO with consideration of temporary, alternative means of nutrition. SLP Visit Diagnosis: Dysphagia, unspecified (R13.10)    Aspiration Risk  Moderate aspiration risk;Severe aspiration risk;Risk for inadequate nutrition/hydration    Diet Recommendation NPO;Alternative means - temporary   Medication Administration: Via alternative means    Other  Recommendations Oral Care Recommendations: Oral care QID Other Recommendations: Have oral suction available    Recommendations for follow up therapy are one component of a multi-disciplinary discharge planning process, led by the attending physician.  Recommendations may be updated based on patient status, additional functional criteria and insurance authorization.  Follow up Recommendations Skilled  nursing-short term rehab (<3 hours/day)      Assistance Recommended at Discharge    Functional Status Assessment Patient has had a recent decline in their functional status and demonstrates the ability to make significant improvements in function in a reasonable and predictable amount of time.  Frequency and Duration min 2x/week  2 weeks       Prognosis Prognosis for Safe Diet Advancement: Fair Barriers to Reach Goals: Severity of deficits      Swallow Study   General HPI: 67 yo presenting 08/28/22 with L sided weakness, not speaking, R gaze preference. 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 Pt was evaluated by SLP in March 2023 and found to have mild-moderate dysarthria; cognitive-linguistic function and swallowing WFL. PMH includes: L frontal  CVA, GERD, sjogren's syndrome, B AKA, takotsubo CM, SLE, RA, PAD, MI, HTN, CAD, anxiety, depression, chiari malformation Type of Study: Bedside Swallow Evaluation Previous Swallow Assessment: see HPI Diet Prior to this Study: NPO Temperature Spikes Noted: No Respiratory Status: Room air History of Recent Intubation:  (for procedure only on 1/1) Behavior/Cognition: Lethargic/Drowsy;Cooperative;Requires cueing Oral Cavity Assessment: Excessive secretions Oral Cavity - Dentition: Missing dentition Patient Positioning: Upright in bed Baseline Vocal Quality: Low vocal intensity Volitional Cough: Weak Volitional Swallow: Able to elicit    Oral/Motor/Sensory Function Overall Oral Motor/Sensory Function: Severe impairment Facial ROM: Reduced right;Reduced left;Suspected CN VII (facial) dysfunction (L > R) Facial Symmetry: Abnormal symmetry left;Suspected CN VII (facial) dysfunction Facial Strength: Reduced right;Reduced left;Suspected CN VII (facial) dysfunction (L > R) Facial Sensation:  (some conflicting evidence - says that L feels different, but then when asked to assess with eyes closed says they feel the same) Lingual Strength: Reduced Mandible: Impaired   Ice Chips Ice chips: Not tested   Thin Liquid Thin Liquid: Not tested    Nectar Thick Nectar Thick Liquid: Not tested   Honey Thick Honey Thick Liquid: Not tested   Puree Puree: Not tested   Solid     Solid: Not tested      Osie Bond., M.A. [place] Office [phone]  Secure chat preferred  08/29/2022,12:21 PM

## 2022-08-29 NOTE — Evaluation (Signed)
Occupational Therapy Evaluation Patient Details Name: Mercedes [name] MRN: [id number] DOB: 1955-12-27 Today's Date: 08/29/2022   History of Present Illness 67 yo s/p 08/28/22 R ICA occlusion with thrombectomy NIH 19 PMH L frontal CVA B AKA, takotsubo CM, SLE, RA, PAD, MI, HTN, CAD.   Clinical Impression   PT admitted with ICA R CVA with thrombectomy. Pt currently with functional limitiations due to the deficits listed below (see OT problem list). Pt was living alone using a w/c and had a personal aide 2 hours at a time. Pt was able to complete all transfers without (A). Pt currently total +2 total (A) sitting eob with strong L lateral lean. Pt will need increased (A) at this time for adls and static sitting balance Pt will benefit from skilled OT to increase their independence and safety with adls and balance to allow discharge SNF.  No family present but likely long term care placement needs       Recommendations for follow up therapy are one component of a multi-disciplinary discharge planning process, led by the attending physician.  Recommendations may be updated based on patient status, additional functional criteria and insurance authorization.   Follow Up Recommendations  Skilled nursing-short term rehab (<3 hours/day)     Assistance Recommended at Discharge Intermittent Supervision/Assistance  Patient can return home with the following      Functional Status Assessment  Patient has had a recent decline in their functional status and demonstrates the ability to make significant improvements in function in a reasonable and predictable amount of time.  Equipment Recommendations  Wheelchair (measurements OT);Wheelchair cushion (measurements OT);Hospital bed;Other (comment) (hoyer)    Recommendations for Other Services Speech consult (concerns for swallow as pt demonstrates poor control of oral secretions)     Precautions / Restrictions Precautions Precautions: Fall Precaution  Comments: SBP 120-140 Restrictions Other Position/Activity Restrictions: h/o B AKA      Mobility Bed Mobility Overal bed mobility: Needs Assistance Bed Mobility: Supine to Sit, Sit to Supine     Supine to sit: Max assist, +2 for physical assistance Sit to supine: Max assist, +2 for physical assistance   General bed mobility comments: Pt able to initiate movement. Assist to elevate trunk. Bed pad to pivot to EOB.    Transfers                   General transfer comment: unable to progress beyond EOB      Balance Overall balance assessment: Needs assistance Sitting-balance support: Single extremity supported Sitting balance-Leahy Scale: Zero Sitting balance - Comments: max assist to maintain EOB balance Postural control: Posterior lean, Left lateral lean                                 ADL either performed or assessed with clinical judgement   ADL Overall ADL's : Needs assistance/impaired Eating/Feeding: Maximal assistance   Grooming: Maximal assistance   Upper Body Bathing: Maximal assistance   Lower Body Bathing: Total assistance   Upper Body Dressing : Maximal assistance   Lower Body Dressing: Total assistance                       Vision Ability to See in Adequate Light: 1 Impaired Vision Assessment?: Yes Eye Alignment: Within Functional Limits Ocular Range of Motion: Restricted on the left Tracking/Visual Pursuits: Impaired - to be further tested in functional context Additional Comments: R gaze  preference and able to come to midline. pt with perpherial vision past midline demonstrated     Perception     Praxis      Pertinent Vitals/Pain Pain Assessment Pain Assessment: Faces Faces Pain Scale: No hurt     Hand Dominance Right   Extremity/Trunk Assessment Upper Extremity Assessment Upper Extremity Assessment: LUE deficits/detail LUE Deficits / Details: no movement of L UE noted. pt with L UE in visual field without  movement.   Lower Extremity Assessment Lower Extremity Assessment: Defer to PT evaluation RLE Deficits / Details: AROM WFL LLE Deficits / Details: no active movement noted, senstion appears intact   Cervical / Trunk Assessment Cervical / Trunk Assessment: Kyphotic;Other exceptions (lateral L lean in sitting) Cervical / Trunk Exceptions: R ribs flared and L side collapsed   Communication Communication Communication: Expressive difficulties   Cognition Arousal/Alertness: Lethargic Behavior During Therapy: Flat affect Overall Cognitive Status: Difficult to assess                                 General Comments: Primarily non-verbal. Able to approrpriately nod yes/no and give thumbs up/thumbs down. Following commands consistently. Able to write her name.     General Comments  VSS 2L [place]    Exercises     Shoulder Instructions      Home Living Family/patient expects to be discharged to:: Private residence Living Arrangements: Alone Available Help at Discharge: Family;Available PRN/intermittently;Personal care attendant Type of Home: House Home Access: Ramped entrance     Home Layout: One level     Bathroom Shower/Tub: Tub/shower unit;Sponge bathes at baseline   Bathroom Toilet: Standard Bathroom Accessibility: Yes   Home Equipment: Wheelchair - manual;Tub bench;Wheelchair - power;BSC/3in1   Additional Comments: aide 2 hours a day      Prior Functioning/Environment Prior Level of Function : Independent/Modified Independent             Mobility Comments: has prosthetic for bil LE but reports not wearing L LE only R LE ADLs Comments: Reports independence with transfer to tub bench and toilet, uses manual w/c in the home, and the scooter outside.  Family assists with any community mobility.        OT Problem List: Decreased strength;Decreased activity tolerance;Impaired balance (sitting and/or standing);Decreased cognition;Decreased safety  awareness;Decreased knowledge of use of DME or AE;Decreased knowledge of precautions;Cardiopulmonary status limiting activity;Impaired UE functional use;Impaired vision/perception      OT Treatment/Interventions: Self-care/ADL training;Therapeutic exercise;Neuromuscular education;Energy conservation;DME and/or AE instruction;Manual therapy;Modalities;Therapeutic activities;Cognitive remediation/compensation;Visual/perceptual remediation/compensation;Patient/family education;Balance training    OT Goals(Current goals can be found in the care plan section) Acute Rehab OT Goals Patient Stated Goal: none stated OT Goal Formulation: Patient unable to participate in goal setting Time For Goal Achievement: 09/12/22 Potential to Achieve Goals: Good  OT Frequency: Min 2X/week    Co-evaluation PT/OT/SLP Co-Evaluation/Treatment: Yes Reason for Co-Treatment: Complexity of the patient's impairments (multi-system involvement);Necessary to address cognition/behavior during functional activity;For patient/therapist safety;To address functional/ADL transfers PT goals addressed during session: Mobility/safety with mobility;Balance OT goals addressed during session: ADL's and self-care;Proper use of Adaptive equipment and DME;Strengthening/ROM      AM-PAC OT "6 Clicks" Daily Activity     Outcome Measure Help from another person eating meals?: A Lot Help from another person taking care of personal grooming?: A Lot Help from another person toileting, which includes using toliet, bedpan, or urinal?: Total Help from another person bathing (including washing, rinsing, drying)?:  Total Help from another person to put on and taking off regular upper body clothing?: A Lot Help from another person to put on and taking off regular lower body clothing?: Total 6 Click Score: 9   End of Session Equipment Utilized During Treatment: Oxygen Nurse Communication: Mobility status;Precautions  Activity Tolerance: Patient  tolerated treatment well Patient left: in bed;with call bell/phone within reach;with bed alarm set  OT Visit Diagnosis: Unsteadiness on feet (R26.81);Muscle weakness (generalized) (M62.81)                Time: 6834-1962 OT Time Calculation (min): 22 min Charges:  OT General Charges $OT Visit: 1 Visit OT Evaluation $OT Eval Moderate Complexity: 1 Mod   Mercedes [name], OTR/L  Acute Rehabilitation Services Office: [phone] .   Mercedes [name] 08/29/2022, 2:29 PM

## 2022-08-29 NOTE — Evaluation (Signed)
Speech Language Pathology Evaluation Patient Details Name: Mercedes [name] MRN: [id number] DOB: [birth year] Today's Date: 08/29/2022 Time: [id number] SLP Time Calculation (min) (ACUTE ONLY): 10 min  Problem List:  Patient Active Problem List   Diagnosis Date Noted   Acute ischemic right ICA stroke ([place]) 08/28/2022   Ischemic stroke ([place]) 08/28/2022   History of CVA with residual deficit 12/14/2021   S/P AKA (above knee amputation) bilateral ([place]) 12/14/2021   Ulceration of stump of above knee amputation ([place]) 11/09/2021   S/P AKA (above knee amputation), right (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)    Atrial flutter with rapid ventricular response vs. SVT 10/27/2014    Class: Acute   Atherosclerosis of native artery of left lower extremity with rest pain ([place]) 12/18/2013   PAD (peripheral artery disease) ([place]) 12/02/2013   SLE (systemic lupus erythematosus) ([place])    CAD - S/P Takotsubo MI in 2000, CFX DES Sept 2014 05/25/2013   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   Tachycardia-bradycardia ([place]) 03/09/2010   PVD- s/p Lt [facility] April 2015 10/29/2009   Dyslipidemia 08/26/2009   Essential hypertension 08/26/2009   CORONARY ATHEROSCLEROSIS NATIVE CORONARY ARTERY 08/26/2009   Secondary cardiomyopathy ([place]) 08/26/2009   Rheumatoid arthritis ([place]) 08/26/2009   Past Medical History:  Past Medical History:  Diagnosis Date   Abnormal LFTs    a. 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 NSTEMI 07/2009: LHC - D2 40%, LAD irreg., EF 50% with apical AK (tako-tsubo CM);  b. Inf STEMI (04/2013): Tx Promus DES to CFX;  c. 08/2013 [facility] CL: No ischemia, dist ant/ap/inf-ap infarct, EF  d. 06/2016 NSTEMI with LM disease: s/p CABG on 07/17/2016 w/ LIMA-LAD, SVG-OM, and SVG-dRCA.   DEPRESSION    Dizziness    ? CVA 01/2010 - carotid dopplers with no ICA stenosis   GERD (gastroesophageal reflux disease)    Headache    History of transmetatarsal amputation of left foot ([place]) 07/17/2019   HYPERLIPIDEMIA    HYPERTENSION    MI (myocardial infarction) ([place])    PAD (peripheral artery disease) ([place])    s/p L fem pop 11/2013 in setting of 1st toe osteo/gangrene   Paroxysmal atrial fibrillation ([facility])    a. anticoagulated with Eliquis 10/2014   Presence of permanent cardiac pacemaker 10/2014   leadless permanent pacemaker   RA (rheumatoid arthritis) ([place])    prior tx by Dr. Ouida Sills   Sjogren's syndrome ([place]) 06/02/2013   pt denies this (12/01/13)   SLE (systemic lupus erythematosus) ([place]) dx 05/2013   follows with rheum Ouida Sills)   Stroke [facility])    March 2023, weakness in right arm is improving   Tachy-brady syndrome ([place])    a. 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   f/u echo 09/2009: EF 50-55%, mild LVH, mod diast  dysfxn, mild apical HK   Wears dentures    Wears glasses    Past Surgical History:  Past Surgical History:  Procedure Laterality Date   ABDOMINAL AORTAGRAM N/A 11/24/2013   Procedure: ABDOMINAL AORTAGRAM WITH BILATERAL RUNOFF WITH POSSIBLE INTERVENTION;  Surgeon: Chuck Hint, MD;  Location: [facility] CATH LAB;  Service: Cardiovascular;  Laterality: N/A;   ABDOMINAL AORTOGRAM W/LOWER EXTREMITY N/A 04/25/2019   Procedure: ABDOMINAL AORTOGRAM W/LOWER EXTREMITY;  Surgeon: Sherren Kerns, MD;  Location: [facility]  INVASIVE CV LAB;  Service: Cardiovascular;  Laterality: N/A;   ABDOMINAL AORTOGRAM W/LOWER EXTREMITY Bilateral 05/15/2019   Procedure: ABDOMINAL AORTOGRAM W/LOWER EXTREMITY;  Surgeon: Nada Libman, MD;  Location: [facility] INVASIVE CV LAB;  Service: Cardiovascular;  Laterality: Bilateral;   ABDOMINAL AORTOGRAM W/LOWER EXTREMITY N/A 08/23/2020   Procedure: ABDOMINAL AORTOGRAM W/LOWER EXTREMITY;  Surgeon: Maeola Harman, MD;  Location: [facility] INVASIVE CV LAB;  Service: Cardiovascular;  Laterality: N/A;   ABDOMINAL AORTOGRAM W/LOWER EXTREMITY N/A 10/29/2020   Procedure: ABDOMINAL AORTOGRAM W/LOWER EXTREMITY;  Surgeon: Sherren Kerns, MD;  [facility]: [facility] INVASIVE CV LAB;  Service: Cardiovascular;  Laterality: N/A;   AMPUTATION Left 12/02/2013   Procedure: LEFT 1ST TOE AMPUTATION;  Surgeon: Sherren Kerns, MD;  Location: [facility] OR;  Service: Vascular;  Laterality: Left;   AMPUTATION Left 07/16/2019   Procedure: LEFT TRANSMETATARSAL AMPUTATION;  Surgeon: Nadara Mustard, MD;  Location: [facility] OR;  Service: Orthopedics;  Laterality: Left;   AMPUTATION Left 10/01/2019   Procedure: LEFT AMPUTATION ABOVE KNEE;  Surgeon: Sherren Kerns, MD;  Location: [facility] OR;  Service: Vascular;  Laterality: Left;   AMPUTATION Right 08/31/2020   Procedure: Right first toe amputation;  Surgeon: Sherren Kerns, MD;  Location: [facility] OR;  Service: Vascular;  Laterality: Right;   AMPUTATION Right 11/23/2020   Procedure: RIGHT AMPUTATION ABOVE KNEE;  Surgeon: Sherren Kerns, MD;  Location: [facility] OR;  Service: Vascular;  Laterality: Right;   AMPUTATION Left 03/14/2022   Procedure: REVISION LEFT ABOVE KNEE AMPUTATION;  Surgeon: Maeola Harman, MD;  Location: [facility] OR;  Service: Vascular;  Laterality: Left;   APPLICATION OF WOUND VAC Left 11/03/2019   Procedure: Application Of Wound Vac, left lower extremity;  Surgeon: Sherren Kerns, MD;  Location: [facility] OR;  Service: Vascular;  Laterality: Left;   CARDIAC CATHETERIZATION N/A  07/12/2016   Procedure: Left Heart Cath and Coronary Angiography;  Surgeon: Lennette Bihari, MD;  [facility]: [facility] INVASIVE CV LAB;  Service: Cardiovascular;  Laterality: N/A;   CARDIAC CATHETERIZATION N/A 07/14/2016   Procedure: Intravascular Ultrasound/IVUS;  Surgeon: Yvonne Kendall, MD;  Location: [facility] INVASIVE CV LAB;  Service: Cardiovascular;  Laterality: N/A;   COLONOSCOPY     CORONARY ANGIOPLASTY  04/2013   CORONARY ARTERY BYPASS GRAFT N/A 07/17/2016   Procedure: CORONARY ARTERY BYPASS GRAFTING (CABG)x3 using right greater saphenous vein harvested endoscopiclly and left internal mammary artery;  Surgeon: Kerin Perna, MD;  Location: [facility] OR;  Service: Open Heart Surgery;  Laterality: N/A;   ENDARTERECTOMY FEMORAL Left 05/12/2019   Procedure: LEFT Femoral Endarterectomy;  Surgeon: Sherren Kerns, MD;  Location: [facility] OR;  Service: Vascular;  Laterality: Left;   FEMORAL-POPLITEAL BYPASS GRAFT Left 12/02/2013   Procedure:  LEFT FEMORAL-BELOW KNEE POPLITEAL ARTERY BYPASS GRAFT;  Surgeon: Sherren Kerns, MD;  Location: [facility] OR;  Service: Vascular;  Laterality: Left;   FEMORAL-POPLITEAL BYPASS GRAFT Left 09/25/2019   Procedure: THROMBECTOMY and PROPATEN BYPASS  FEMORAL TO BELOW KNEE POPLITEAL ARTERY BYPASS GRAFT  LEFT LEG;  Surgeon: [name], MD;  Location: [place];  Service: Vascular;  Laterality: Left;   FEMORAL-POPLITEAL BYPASS GRAFT Right 08/24/2020   Procedure: RIGHT FEMORAL-BELOW KNEE POPLITEAL ARTERY BYPASS USING GORE PROPATEN GRAFT;  Surgeon: Waynetta Sandy, MD;  Location: [place];  Service: Vascular;  Laterality: Right;   FEMORAL-POPLITEAL BYPASS GRAFT Right 11/01/2020   Procedure: Thrombectomy and Revision of Right Femoral to below knee Bypass with Interpostional graft to Tibial / peroneal Trunk and Endarterectomy Tibial Letitia Caul Trunk.;  Surgeon: Elam Dutch, MD;  Location: [facility] OR;  Service: Vascular;  Laterality: Right;   INTRAOPERATIVE ARTERIOGRAM Right 11/01/2020   Procedure:  ANTEGRADE RIGHT LOWER EXTREMITY INTRA OPERATIVE ARTERIOGRAM;  Surgeon: Elam Dutch, MD;  Location: [facility] OR;  Service: Vascular;  Laterality: Right;   LEFT HEART CATHETERIZATION WITH CORONARY ANGIOGRAM N/A 05/19/2013   Procedure: LEFT HEART CATHETERIZATION WITH CORONARY ANGIOGRAM;  Surgeon: Blane Ohara, MD;  Location: [facility] CATH LAB;  Service: Cardiovascular;  Laterality: N/A;   LEFT HEART CATHETERIZATION WITH CORONARY ANGIOGRAM N/A 10/28/2014   Procedure: LEFT HEART CATHETERIZATION WITH CORONARY ANGIOGRAM;  Surgeon: Wellington Hampshire, MD;  Location: [place] CATH LAB;  Service: Cardiovascular;  Laterality: N/A;   LOWER EXTREMITY ANGIOGRAM Left 05/12/2019   Procedure: Lower Extremity Angiogram;  Surgeon: Elam Dutch, MD;  Location: [facility] OR;  Service: Vascular;  Laterality: Left;   PATCH ANGIOPLASTY Left 05/12/2019   Procedure: Left Femoral Patch Angioplasty USING CADAVERIC SAPHENOUS VEIN;  Surgeon: Elam Dutch, MD;  Location: [facility] OR;  Service: Vascular;  Laterality: Left;   PERCUTANEOUS CORONARY STENT INTERVENTION (PCI-S)  05/19/2013   Procedure: PERCUTANEOUS CORONARY STENT INTERVENTION (PCI-S);  Surgeon: Blane Ohara, MD;  Location: [facility] CATH LAB;  Service: Cardiovascular;;   PERIPHERAL VASCULAR INTERVENTION Right 10/29/2020   Procedure: PERIPHERAL VASCULAR INTERVENTION;  Surgeon: Elam Dutch, MD;  Location: [place] CV LAB;  Service: Cardiovascular;  Laterality: Right;  common iliac   PERMANENT PACEMAKER INSERTION N/A 11/04/2014   STJ Leadless pacemaker implanted by Dr Rayann Heman for tachy/brady   RADIOLOGY WITH ANESTHESIA N/A 08/28/2022   Procedure: RADIOLOGY WITH ANESTHESIA;  Surgeon: Radiologist, Medication, MD;  Location: [place];  Service: Radiology;  Laterality: N/A;   TEE WITHOUT CARDIOVERSION N/A 07/17/2016   Procedure: TRANSESOPHAGEAL ECHOCARDIOGRAM (TEE);  Surgeon: Ivin Poot, MD;  Location: [place];  Service: Open Heart Surgery;  Laterality: N/A;   THROMBECTOMY FEMORAL ARTERY Left  05/12/2019   Procedure: Left Ilio-Femoral Artery and Femoral-popliteal Graft Thrombectomy;  Surgeon: Elam Dutch, MD;  Location: [facility] OR;  Service: Vascular;  Laterality: Left;   THROMBECTOMY OF BYPASS GRAFT FEMORAL- POPLITEAL ARTERY Right 11/01/2020   Procedure: POSSIBLE THROMBOLYSIS VERSUS OPEN THROMBECTOMY AND REVSION OF RIGHT FEMORAL-POPLITEAL ARTERY BYPASS;  Surgeon: Elam Dutch, MD;  Location: [place];  Service: Vascular;  Laterality: Right;   TUBAL LIGATION     VISCERAL ANGIOGRAPHY N/A 04/25/2019   Procedure: MESENTRIC  ANGIOGRAPHY;  Surgeon: Elam Dutch, MD;  Location: [place] CV LAB;  Service: Cardiovascular;  Laterality: N/A;   WOUND DEBRIDEMENT Left 11/03/2019   Procedure: Debridement Wound of left above the knee amputation;  Surgeon: Elam Dutch, MD;  Location: [facility] OR;  Service: Vascular;  Laterality: Left;   HPI:  67 yo presenting 08/28/22 with L sided weakness, not speaking, R gaze preference. 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 Pt was evaluated by SLP in March 2023 and found to have mild-moderate dysarthria; cognitive-linguistic function and swallowing  WFL. PMH includes: L frontal CVA, GERD, sjogren's syndrome, B AKA, takotsubo CM, SLE, RA, PAD, MI, HTN, CAD, anxiety, depression, chiari malformation   Assessment / Plan / Recommendation Clinical Impression  Pt has a h/o dysarthria from prior stroke, now presenting with severe dysarthria that makes intelligibility difficult even at the word level without context clues. For expressive communication, she tends to default to use of gestures. Her comprehension appears to be relatively intact, although drowsiness sometimes appears to impact timeliness of her responses. Pt will need ongoing assessment, perhaps testing more for use of alternative communication and expressive language abilities, but pt was becoming increasingly lethargic during this eval. Recommend SNF upon discharge.    SLP Assessment  SLP  Recommendation/Assessment: Patient needs continued Speech [place] Pathology Services SLP Visit Diagnosis: Dysarthria and anarthria (R47.1)    Recommendations for follow up therapy are one component of a multi-disciplinary discharge planning process, led by the attending physician.  Recommendations may be updated based on patient status, additional functional criteria and insurance authorization.    Follow Up Recommendations  Skilled nursing-short term rehab (<3 hours/day)    Assistance Recommended at Discharge  Frequent or constant Supervision/Assistance  Functional Status Assessment Patient has had a recent decline in their functional status and demonstrates the ability to make significant improvements in function in a reasonable and predictable amount of time.  Frequency and Duration min 2x/week  2 weeks      SLP Evaluation Cognition  Overall Cognitive Status: Difficult to assess Arousal/Alertness: Lethargic Orientation Level: Oriented to person;Oriented to place;Disoriented to situation;Disoriented to time Attention: Sustained Sustained Attention: Impaired Sustained Attention Impairment: Verbal basic;Functional basic       Comprehension  Auditory Comprehension Overall Auditory Comprehension: Appears within functional limits for tasks assessed    Expression Expression Primary Mode of Expression: Verbal Verbal Expression Overall Verbal Expression: Impaired (very limited verbal output - question impact of motor speech) Non-Verbal Means of Communication: Gestures (pt tends to default to using gestures) Written Expression Dominant Hand: Right   Oral / Motor  Oral Motor/Sensory Function Overall Oral Motor/Sensory Function: Severe impairment Facial ROM: Reduced right;Reduced left;Suspected CN VII (facial) dysfunction (L > R) Facial Symmetry: Abnormal symmetry left;Suspected CN VII (facial) dysfunction Facial Strength: Reduced right;Reduced left;Suspected CN VII (facial)  dysfunction (L > R) Facial Sensation:  (some conflicting evidence - says that L feels different, but then when asked to assess with eyes closed says they feel the same) Lingual Strength: Reduced Mandible: Impaired Motor Speech Overall Motor Speech: Impaired Respiration: Impaired Level of Impairment: Word Phonation: Low vocal intensity Articulation: Impaired Level of Impairment: Word Intelligibility: Intelligibility reduced Word: 0-24% accurate Interfering Components: Premorbid status            Osie Bond., M.A. [place] Office [phone]  Secure chat preferred  08/29/2022, 12:38 PM

## 2022-08-29 NOTE — Progress Notes (Signed)
  Echocardiogram 2D Echocardiogram has been performed.  Mercedes [name] 08/29/2022, 12:12 PM

## 2022-08-29 NOTE — Progress Notes (Addendum)
STROKE TEAM PROGRESS NOTE   INTERVAL HISTORY No family is at the bedside, PT working with patient on evaluation. Neurologic exam is notable for left hemiparesis, right gaze that is able to cross midline but unable to fully look leftward, expressive aphasia, severe dysarthria, left facial droop.  No neglect on the left. There is a documented history of nonadherence to Eliquis though patient nods "yes" when examiner asks if she has been compliant with her Eliquis medication.  Patient remains on Cleviprex drip for blood pressure control.  Blood pressure parameters to be liberalized after 24 hours of thrombectomy if imaging is without acute hemorrhage. Goal of discontinuing Cleviprex gtt.   Patient's pacemaker is not compatible with MRI.  Will obtain CT head repeat imaging this afternoon. Patient failed swallow evaluation per ST with some concern of nausea/vomiting after evaluation.  PRN Zofran ordered.  Vitals:   08/29/22 0630 08/29/22 0645 08/29/22 0700 08/29/22 0745  BP: 118/65 122/65 123/69 128/69  Pulse: (!) 59 (!) 59 63 (!) 57  Resp: 16 11 (!) 9 15  Temp:      TempSrc:      SpO2: 100% 100% 96% 95%  Weight:       CBC:  Recent Labs  Lab 08/28/22 1738 08/28/22 1742 08/28/22 2120  WBC 7.5  --  8.2  NEUTROABS 3.6  --   --   HGB 13.1 14.6 12.5  HCT 40.2 43.0 39.0  MCV 73.6*  --  74.7*  PLT 315  --  166   Basic Metabolic Panel:  Recent Labs  Lab 08/28/22 1738 08/28/22 1742 08/28/22 2120  NA 132* 135  --   K 4.1 4.7  --   CL 102 105  --   CO2 17*  --   --   GLUCOSE 132* 133*  --   BUN 16 22  --   CREATININE 1.45* 1.50* 1.26*  CALCIUM 9.0  --   --    Lipid Panel:  Recent Labs  Lab 08/29/22 0515  CHOL 144  TRIG 88  HDL 44  CHOLHDL 3.3  VLDL 18  LDLCALC 82   HgbA1c: No results for input(s): "HGBA1C" in the last 168 hours. Urine Drug Screen: No results for input(s): "LABOPIA", "COCAINSCRNUR", "LABBENZ", "AMPHETMU", "THCU", "LABBARB" in the last 168 hours.  Alcohol  Level  Recent Labs  Lab 08/28/22 1738  ETH <10    IMAGING past 24 hours CT ANGIO HEAD NECK W WO CM (CODE STROKE)  Result Date: 08/28/2022 CLINICAL DATA:  Code stroke. Patient found with left-sided motor weakness and right gaze deviation. EXAM: CT ANGIOGRAPHY HEAD AND NECK TECHNIQUE: Multidetector CT imaging of the head and neck was performed using the standard protocol during bolus administration of intravenous contrast. Multiplanar CT image reconstructions and MIPs were obtained to evaluate the vascular anatomy. Carotid stenosis measurements (when applicable) are obtained utilizing NASCET criteria, using the distal internal carotid diameter as the denominator. RADIATION DOSE REDUCTION: This exam was performed according to the departmental dose-optimization program which includes automated exposure control, adjustment of the mA and/or kV according to patient size and/or use of iterative reconstruction technique. CONTRAST:  38mL OMNIPAQUE IOHEXOL 350 MG/ML SOLN COMPARISON:  CTA of the head and neck 11/21/2021 FINDINGS: CTA NECK FINDINGS Aortic arch: A common origin of the left common carotid artery and innominate artery is noted. No significant stenosis is present at the arch. Right carotid system: The right common carotid artery is within normal limits. Atherosclerotic calcifications are present at the bifurcation without  a significant stenosis. High-grade stenosis is present in the right external carotid artery. The right internal carotid artery narrows in the neck and is occluded in its distal segment. There is no reconstitution prior to the skull base. Left carotid system: The left common carotid artery is within normal limits. Atherosclerotic changes are present at the carotid bifurcation. The proximal left ICA is narrowed to 1.5 mm. More distal left ICA is normal. It measures 3.8 mm. Vertebral arteries: The vertebral arteries are codominant. Both vertebral arteries originate from the subclavian  arteries without significant stenosis. No significant stenosis is present in either vertebral artery in the neck. Skeleton: Mild degenerative changes of the cervical spine are stable. No focal osseous lesions are present. Other neck: Soft tissues the neck are otherwise unremarkable. Salivary glands are within normal limits. Thyroid is normal. No significant adenopathy is present. No focal mucosal or submucosal lesions are present. Upper chest: Centrilobular emphysematous changes are present at the lung apices. No nodule or mass lesion is present. No focal airspace disease is present. Interlobular septal thickening is consistent with edema. Review of the MIP images confirms the above findings CTA HEAD FINDINGS Anterior circulation: The right internal carotid artery is occluded. Atherosclerotic calcifications are present in the cavernous left internal carotid artery without significant stenosis. The left A1 and M1 segments are normal. The anterior communicating artery is patent. Both A2 segments fill from the left. The left MCA bifurcation is within normal limits. There is some segmental narrowing in distal left MCA branch vessels without a significant proximal stenosis or occlusion. Minimal filling of distal right MCA branch vessels is present with some collaterals. The right M1 and A1 segments are occluded. Posterior circulation: The PICA origins are visualized and normal bilaterally. Vertebrobasilar junction and basilar artery are normal. The right posterior cerebral artery originates from the basilar tip. The left posterior cerebral artery is of fetal type. PCA branch vessels are normal bilaterally. Venous sinuses: The dural sinuses are patent. The straight sinus and deep cerebral veins are intact. Cortical veins are within normal limits. No significant vascular malformation is evident. Anatomic variants: Fetal type left posterior cerebral artery. Review of the MIP images confirms the above findings IMPRESSION: 1.  Occlusion of the right internal carotid artery in its distal segment without reconstitution prior to the skull base. 2. The right ICA is occluded through the ICA terminus. Right M1 and A1 segments are occluded. 3. Minimal filling of distal right MCA branch vessels with some collaterals. 4. Atherosclerotic changes at the right carotid bifurcation and cavernous left internal carotid artery without significant stenosis. 5. Fetal type left posterior cerebral artery. 6. Probable pulmonary edema. 7.  Emphysema (ICD10-J43.9). Electronically Signed   By: [name] Morelle M.D.   On: 08/28/2022 18:18   CT HEAD CODE STROKE WO CONTRAST  Result Date: 08/28/2022 CLINICAL DATA:  Code stroke. Found nonverbal with right-sided gaze and left-sided motor weakness. EXAM: CT HEAD WITHOUT CONTRAST TECHNIQUE: Contiguous axial images were obtained from the base of the skull through the vertex without intravenous contrast. RADIATION DOSE REDUCTION: This exam was performed according to the departmental dose-optimization program which includes automated exposure control, adjustment of the mA and/or kV according to patient size and/or use of iterative reconstruction technique. COMPARISON:  CT head and CTA head and neck 11/21/2021 FINDINGS: Brain: Periventricular white matter disease is stable. Slight asymmetric hypoattenuation is present in the right lentiform nucleus and insular ribbon. Caudate head and internal capsule are within normal limits. No other cortical lesions are  evident. The ventricles are of normal size. No significant extraaxial fluid collection is present. The brainstem and cerebellum are within normal limits. Vascular: Atherosclerotic calcifications are present within the cavernous internal carotid arteries bilaterally. Asymmetric hyperdensity is present in the right M1 segment. Skull: Calvarium is intact. No focal lytic or blastic lesions are present. No significant extracranial soft tissue lesion is present.  Sinuses/Orbits: The paranasal sinuses and mastoid air cells are clear. The globes and orbits are within normal limits. ASPECTS [facility] Stroke Program Early CT Score) - Ganglionic level infarction (caudate, lentiform nuclei, internal capsule, insula, M1-M3 cortex): 5/7 - Supraganglionic infarction (M4-M6 cortex): 3/3 Total score (0-10 with 10 being normal): 8/10 IMPRESSION: 1. Asymmetric hyperdensity in the right M1 segment concerning for acute thrombus. 2. Slight asymmetric hypoattenuation in the right lentiform nucleus and insular ribbon. This could represent early infarct. 3. Aspects is 8/10. 4. Stable mild periventricular white matter disease. This likely reflects the sequela of chronic microvascular ischemia. The above was relayed via text pager to Dr. Rollene Fare on 08/28/2022 at 18:06 . Electronically Signed   By: Marin Roberts M.D.   On: 08/28/2022 18:06    PHYSICAL EXAM  Physical Exam  Constitutional: Young African-American female laying in ICU bed. Psych: Affect appropriate to situation.  Cooperative with exam Eyes: No scleral injection HENT: No OP obstrucion MSK: no joint deformities.  Cardiovascular: Normal rate and regular rhythm.  Respiratory: Effort normal, non-labored breathing  Neuro: Mental Status: Patient is awake and alert.  She is unable to answer orientation questions.  She nods/shakes head "yes" or "no" appropriately to examiner questions. Patient is able to follow commands without difficulty. Patient is aphasic and severely dysarthric when attempting to verbalize. Cranial Nerves: II: Visual Fields are full. Pupils are equal, round, and reactive to light.   III,IV, VI: Rightward gaze preference, will cross midline towards the left but unable to fully look leftward. V: Facial sensation is symmetric to light touch VII: Left facial droop noted VIII: Hearing is intact to voice X: Palate elevates symmetrically XI: Shoulder shrug is absent on the left XII: Tongue  protrudes midline  Motor: Tone is normal. Bulk is normal.  Left upper extremity flaccid.  Bilateral AKAs. Right leg elevates antigravity, left leg is without movement.  Sensory: Sensation to the left thigh to light touch. Cerebellar: Unable to assess HKS due to bilateral AKA, unable to assess FNF on the left due to flaccid LUE. No overt dysmetria on the right upper extremity.   ASSESSMENT/PLAN Mercedes [name] is a 67 y.o. female with history of CAD, GERD, CVA in March of 2023, HTN, HLD, PAD with bilateral AKA, AF on Eliquis, AICD, Sjogren's syndrome, and cardiomyopathy presenting with right gaze deviation and left-sided weakness. LKW 08/28/22 15:45.  Stroke: Acute right hemispheric infarct due to acute right ICA terminus occlusion.  Etiology is likely embolic secondary to atrial fibrillation s/p IR mechanical thrombectomy with direct contact aspiration of right ICA, MCA, and ACA with complete recanalization.  Code Stroke asymmetric hyperdensity in the right M1 segment concerning for acute thrombus.  Slight asymmetric hypoattenuation in the right lentiform nucleus and insular ribbon.  This could represent early infarct. ASPECTS 8/10.  Stable mild periventricular white matter disease.  This likely reflects sequela of chronic microvascular ischemia. CTA head & neck occlusion of the right ICA in its distal segment without reconstitution prior to the skull base.  The right ICA is occluded through the ICA terminus.  Right M1 and A1 segments are occluded.  Minimal filling of distal right MCA branch vessels with some collaterals.  Atherosclerotic changes of the right carotid bifurcation and cavernous left ICA without significant stenosis.  Left posterior cerebral artery. Cerebral angio occlusion of the right ICA terminus identified.  Mechanical thrombectomy performed with direct contact aspiration and combined aspiration and stent retriever in the ICA, MCA, and ACA achieving complete recanalization.  Flat  panel CT showed contrast staining in the right basal ganglia. Post IR CT pending MRI unable to obtain due to PPM. 2D Echo pending LDL 82 HgbA1c 6.6 VTE prophylaxis -Lovenox    Diet   Diet NPO time specified   Eliquis (apixaban) daily prior to admission, now on No antithrombotic. Eliquis held in the setting of acute CVA.  Therapy recommendations: SNF Disposition: Pending  History of CVA in March 2023 Presenting with speech disturbance and right upper extremity weakness Following her CVA in March, patient had residual dysarthria and improving RUE weakness It was felt that her stroke was due to PAF with Eliquis nonadherence as patient stopped taking AC as she perceived "it was not doing anything for me" Eliquis restarted during this hospitalization 5 mg twice daily No neurology outpatient follow up available for review on EMAR  Paroxysmal atrial fibrillation Previously noncompliant with CVA in March of 2023 Patient endorses current compliance with Eliquis   Hypertension Home meds: Lopressor Unstable Requiring Cleviprex drip for SBP 120 - 140 for 24 hours post IR thrombectomy Will liberalize SBP goal after 24 hours post-IR with goal of discontinuing Cleviprex gtt Long-term BP goal normotensive  Hyperlipidemia CAD, PAD s/p bilateral AKA Home meds: Crestor 40 mg, resumed in hospital LDL 82, goal < 70 Continue statin at discharge  Other Stroke Risk Factors Advanced Age >/= 91  Former cigarette smoker Hx stroke/TIA Coronary artery disease, MI s/p PPM Afib with a history of nonadherence to Eliquis  Other Active Problems PAD s/p bilateral AKA Sjogren syndrome  Hospital day # 1  Mercedes [name], AGACNP-BC [facility] Pager: [phone]   STROKE MD NOTE :  I have personally obtained history,examined this patient, reviewed notes, independently viewed imaging studies, participated in medical decision making and plan of care.ROS completed by me personally and  pertinent positives fully documented  I have made any additions or clarifications directly to the above note. Agree with note above.  Patient with known history of A-fib and doubtful compliance on Eliquis presented with terminal right ICA occlusion and underwent successful mechanical thrombectomy with complete recanalization of the terminal right ICA, MCA and ACA.  Neurological exam remains with significant aphasia and left hemiplegia.  MRI cannot be obtained due to incompatible pacemaker hence we will repeat CT scan later today.  Continue strict blood pressure control as per postintervention protocol.  Mobilize out of bed.  Therapy consults.  Need to resume Eliquis when she is able to swallow safely if no bleed on CT scan.  No family available at the bedside for discussion.  Discussed with Dr. Sherlon Handing interventional neuroradiology. This patient is critically ill and at significant risk of neurological worsening, death and care requires constant monitoring of vital signs, hemodynamics,respiratory and cardiac monitoring, extensive review of multiple databases, frequent neurological assessment, discussion with family, other specialists and medical decision making of high complexity.I have made any additions or clarifications directly to the above note.This critical care time does not reflect procedure time, or teaching time or supervisory time of PA/NP/Med Resident etc but could involve care discussion time.  I spent 30 minutes of neurocritical care  time  in the care of  this patient.       Mercedes Heady, MD Medical Director [facility] Pager: [phone] 08/29/2022 3:23 PM  To contact Stroke Continuity provider, please refer to [email or web address]. After hours, contact General Neurology

## 2022-08-30 ENCOUNTER — Inpatient Hospital Stay (HOSPITAL_COMMUNITY): Payer: Medicare HMO

## 2022-08-30 DIAGNOSIS — E43 Unspecified severe protein-calorie malnutrition: Secondary | ICD-10-CM | POA: Insufficient documentation

## 2022-08-30 DIAGNOSIS — I63231 Cerebral infarction due to unspecified occlusion or stenosis of right carotid arteries: Secondary | ICD-10-CM | POA: Diagnosis not present

## 2022-08-30 LAB — GLUCOSE, CAPILLARY
Glucose-Capillary: 131 mg/dL — ABNORMAL HIGH (ref 70–99)
Glucose-Capillary: 121 mg/dL — ABNORMAL HIGH (ref 70–99)

## 2022-08-30 LAB — PHOSPHORUS: Phosphorus: 2.7 mg/dL (ref 2.5–4.6)

## 2022-08-30 LAB — MAGNESIUM: Magnesium: 2.1 mg/dL (ref 1.7–2.4)

## 2022-08-30 MED ORDER — HYDRALAZINE HCL 20 MG/ML IJ SOLN
20.0000 mg | INTRAMUSCULAR | Status: DC | PRN
  Filled 2022-08-30: qty 1

## 2022-08-30 MED ORDER — PROSOURCE TF20 ENFIT COMPATIBL EN LIQD
60.0000 mL | Freq: Every day | ENTERAL | Status: DC
  Administered 2022-08-30 – 2022-09-12 (×12): 60 mL
  Filled 2022-08-30 (×12): qty 60

## 2022-08-30 MED ORDER — AMLODIPINE BESYLATE 10 MG PO TABS
10.0000 mg | ORAL_TABLET | Freq: Every day | ORAL | Status: DC
  Administered 2022-08-30 – 2022-09-13 (×15): 10 mg
  Filled 2022-08-30 (×15): qty 1

## 2022-08-30 MED ORDER — HYDRALAZINE HCL 20 MG/ML IJ SOLN
10.0000 mg | INTRAMUSCULAR | Status: DC | PRN
  Administered 2022-08-30: 10 mg via INTRAVENOUS
  Filled 2022-08-30: qty 1

## 2022-08-30 MED ORDER — LABETALOL HCL 5 MG/ML IV SOLN
10.0000 mg | INTRAVENOUS | Status: DC | PRN
  Administered 2022-08-30: 10 mg via INTRAVENOUS
  Filled 2022-08-30 (×2): qty 4

## 2022-08-30 MED ORDER — OSMOLITE 1.2 CAL PO LIQD
1000.0000 mL | ORAL | Status: DC
  Administered 2022-08-30 – 2022-09-06 (×6): 1000 mL

## 2022-08-30 NOTE — TOC Initial Note (Signed)
Transition of Care [facility]) - Initial/Assessment Note    Patient Details  Name: Mercedes [name] MRN: [id number] Date of Birth: [birth year]  Transition of Care [facility] CM/SW Contact:    Mearl Latin, LCSW Phone Number: 08/30/2022, 4:54 PM  Clinical Narrative:                 CSW received consult for possible SNF placement at time of discharge. CSW spoke with patient's daughter who reported she lives in [place] but her daughters are here assisting with patient. She expressed understanding of PT recommendation and is agreeable to SNF placement at time of discharge. CSW discussed insurance authorization process and will provide Medicare SNF ratings list. CSW will send out referrals for review once more medically stable.    Skilled Nursing Rehab Facilities-   [email or web address]   Ratings out of 5 stars (5 the highest)   Name Address  Phone # Quality Care Staffing Health Inspection Overall  [facility] [address] [place] [phone] 4 5 2 3   [facility]  [address], [place] [phone] 4 2 5 5   [facility] [address] [place], [address] [place] 1 3 1 1   [facility] [address] 2 2 4 4   [facility] [address] [phone] 2 1 2 1   [facility] [phone] N[address], [phone] 3 3 4 4   [facility] [address] [place] 4 1 3 2   [facility] [address], [facility] [place] 4 1 3 2   [address] ([place]) [address], [facility] 3 1 2 1   [facility] [phone] [address], [phone] 3 1 1 1   [facility] [address] [facility] [phone] 3 2 2 2   [facility] ([place]) [address], [name] [phone] 3 1 1 1   [address] [facility] 4 2 4 4           [facility] [address]  [facility]      [facility] [address], [place] [phone] 4 1 3 2   [facility] [place] [address] Eligha Bridegroom [phone] 3 1 5 4   [address] [place] [address], [place] [phone] 1 1 2 1   [facility] [address], [facility] [phone] 2 2 4 4           [address] (no [facility]) [address], [place] [phone] 5 5 5 5   [facility] (No Humana) 7700 [phone] [address] [place] [address] 4 1 4 3   [facility] (No UHC) [address] [place], [place] 878 [place] 5 5 5 5   [facility] [address], [address] [phone] 2 3 5 5   [facility] [address], [place] [address] 1 1 2 1   [facility] [address] [place] [phone] 3 1 1 1   [facility] [address] [place] [phone] 5 2 5 5   [facility]  [address] [place] 2 2 1 1   [facility] [address], [facility] [place] 3 2 1 1   [facility] [address] [place], [address] [place] 2 2 2 2           [facility] [address], [place] [phone] 1 1 1 1   [place] [address], [phone]  [phone] 2 4 3 3   [facility] [place] [address], [address] [phone] 3 2 3 3   [facility] [place], [place] 2 1 1 1   [facility] (No Humana) [address], [place] [phone] 2 2 3 3   [facility] [address], Tia Alert [phone] 2 1 1 1           [facility], [place] 5 4 5 5   [facility])  [facility] [address], [place] 2 1 2 1   [facility] [place] [address], [address] [phone] 3 1 4 3   [place] [address], [place] 3 3 4 4   [address] [place], [place] 2 3 1 1   [address] [facility]) [address] [place] [phone] 2 1 4 3      Expected  Discharge Plan: [place] Barriers to Discharge: Insurance Authorization, Continued Medical Work up, SNF Pending bed offer   Patient Goals and CMS Choice Patient states their goals for this hospitalization and ongoing recovery are:: Rehab CMS Medicare.gov Compare Post Acute Care list provided to:: Patient Represenative (must comment) Choice offered to / list presented to : Adult [place] ownership interest in [facility].provided to:: Adult Children    Expected Discharge Plan and Services In-house Referral: Clinical Social Work   Post Acute Care Choice: [place] Living arrangements for the past 2 months: [place]                                      Prior Living Arrangements/Services Living arrangements for the past 2 months: Single Family Home Lives with:: Self Patient language and need for interpreter reviewed:: Yes Do you feel safe going back to the place where you live?: Yes      Need for Family Participation in Patient Care: Yes (Comment) Care giver support system in place?: Yes (comment)   Criminal Activity/Legal Involvement Pertinent to Current Situation/Hospitalization: No - Comment as needed  Activities of Daily Living      Permission Sought/Granted Permission sought to share information with : Facility [phone], Family Supports Permission granted to share information with : No  Share Information with NAME: Meka  Permission granted to share info w AGENCY: SNFs  Permission granted to share info w Relationship: Daughter  Permission granted to share info w Contact Information: [phone]  Emotional Assessment Appearance:: Appears stated age Attitude/Demeanor/Rapport: Unable to Assess Affect (typically observed): Unable to Assess Orientation: : Oriented to Self, Oriented to Place, Oriented to  Time Alcohol / Substance Use: Not Applicable Psych Involvement: No  (comment)  Admission diagnosis:  Acute ischemic right ICA stroke ([place]) [I63.231] Ischemic stroke [facility]) [I63.9] Patient Active Problem List   Diagnosis Date Noted   Protein-calorie malnutrition, severe 08/30/2022   Acute ischemic right ICA stroke ([place]) 08/28/2022   Ischemic stroke (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 [place] [address] Phone: [phone] Fax: [phone]     Social Determinants of Health (SDOH) Social History: [place]: No Food Insecurity (07/15/2021)  Housing: Low Risk  (07/15/2021)  Transportation Needs: No Transportation Needs (07/15/2021)  Alcohol Screen: Low Risk  (07/15/2021)  Depression (PHQ2-9): Low Risk  (11/08/2021)  Financial Resource Strain: Low Risk  (07/15/2021)  Physical Activity: Inactive (07/15/2021)  Social Connections: Unknown (07/15/2021)  Stress: No Stress Concern Present (07/15/2021)  Tobacco Use: Medium Risk (08/29/2022)   SDOH Interventions:     Readmission Risk Interventions    03/17/2022   10:38 AM 09/08/2020   12:03 PM  Readmission Risk Prevention Plan  Post Dischage Appt Complete   Medication Screening Complete   Transportation Screening Complete Complete  PCP or Specialist Appt within 5-7 Days  Complete  Home Care Screening  Complete  Medication Review (RN CM)  Complete

## 2022-08-30 NOTE — Progress Notes (Addendum)
STROKE TEAM PROGRESS NOTE   INTERVAL HISTORY Is seen in her room with no family at the bedside.  She continues to require Cleviprex for blood pressure control, will start as needed blood pressure medications.  Will transfer out of the ICU if she is able to remain off Cleviprex.  She will receive a core track today, and oral blood pressure medications will be started.  Neurological exam remains unchanged without significant improvement.  Vitals:   08/30/22 1115 08/30/22 1130 08/30/22 1145 08/30/22 1200  BP: (!) 159/81 (!) 156/69 133/65 (!) 164/70  Pulse: 70 72 66 61  Resp: 20 16 16 16   Temp:      TempSrc:      SpO2: 96% 98% 98% 98%  Weight:       CBC:  Recent Labs  Lab 08/28/22 1738 08/28/22 1742 08/28/22 2120  WBC 7.5  --  8.2  NEUTROABS 3.6  --   --   HGB 13.1 14.6 12.5  HCT 40.2 43.0 39.0  MCV 73.6*  --  74.7*  PLT 315  --  0000000    Basic Metabolic Panel:  Recent Labs  Lab 08/28/22 1738 08/28/22 1742 08/28/22 2120  NA 132* 135  --   K 4.1 4.7  --   CL 102 105  --   CO2 17*  --   --   GLUCOSE 132* 133*  --   BUN 16 22  --   CREATININE 1.45* 1.50* 1.26*  CALCIUM 9.0  --   --     Lipid Panel:  Recent Labs  Lab 08/29/22 0515  CHOL 144  TRIG 88  HDL 44  CHOLHDL 3.3  VLDL 18  LDLCALC 82    HgbA1c: No results for input(s): "HGBA1C" in the last 168 hours. Urine Drug Screen: No results for input(s): "LABOPIA", "COCAINSCRNUR", "LABBENZ", "AMPHETMU", "THCU", "LABBARB" in the last 168 hours.  Alcohol Level  Recent Labs  Lab 08/28/22 1738  ETH <10     IMAGING past 24 hours CT HEAD WO CONTRAST (5MM)  Result Date: 08/29/2022 CLINICAL DATA:  Stroke, follow-up EXAM: CT HEAD WITHOUT CONTRAST TECHNIQUE: Contiguous axial images were obtained from the base of the skull through the vertex without intravenous contrast. RADIATION DOSE REDUCTION: This exam was performed according to the departmental dose-optimization program which includes automated exposure control,  adjustment of the mA and/or kV according to patient size and/or use of iterative reconstruction technique. COMPARISON:  08/28/2022 CT head and post percutaneous intervention CT head FINDINGS: Brain: Hypodensity in the right basal ganglia, with mild central hyperdensity measuring 2.3 x 1.2 x 2.0 cm (AP x TR x CC) (series 3, image 16), which may represent infarct with petechial hemorrhage versus contrast staining in an area of infarction, which appears similar in size to the postprocedural CT but decreased in density. Additional hyperdensity is seen at the lateral aspect of the right basal ganglia (series 3, image 17) and in the inter hemispheric fissure, series 3, image 13), similar to post procedural CT; this could also represent subarachnoid hemorrhage versus contrast staining. Mild edema is associated with the right basal ganglia, with mild local mass effect and effacement of the right lateral ventricle, but no significant midline shift. No evidence of additional acute infarct, mass, or hydrocephalus. Vascular: No hyperdense vessel. Skull: No acute osseous abnormality. Sinuses/Orbits: Minimal mucosal thickening in the maxillary sinuses. No acute finding in the orbits. Other: Trace fluid in right mastoid air cells. IMPRESSION: 1. Hypodensity in the right basal ganglia, with mild  central hyperdensity measuring 2.3 x 1.2 x 2.0 cm, likely acute infarct with petechial hemorrhage versus contrast staining. The hyperdense area appears similar in size to the post thrombectomy CT but decreased in density. Heidelberg classification 1b: HI2, confluent petechiae, no mass effect. 2. Additional hyperdensity at the lateral aspect of the right basal ganglia and in the interhemispheric fissure, similar to the postprocedural CT, which represent subarachnoid hemorrhage versus contrast staining. 3. Mild edema with narrowing of the right lateral ventricle but no significant midline shift. Electronically Signed   By: Merilyn Baba M.D.    On: 08/29/2022 17:42    PHYSICAL EXAM  Physical Exam  Constitutional: Middle-aged African-American female laying in ICU bed. Psych: Affect appropriate to situation.  Cooperative with exam Eyes: No scleral injection HENT: No OP obstrucion MSK: no joint deformities.  Cardiovascular: Normal rate and regular rhythm.  Respiratory: Effort normal, non-labored breathing  Neuro: Mental Status: Patient is awake and alert.  She is unable to answer orientation questions.  She nods/shakes head "yes" or "no" appropriately to examiner questions. Patient is able to follow commands without difficulty. Patient is aphasic and severely dysarthric when attempting to verbalize. Cranial Nerves: II: Does not blink to threat on the left pupils are equal, round, and reactive to light.   III,IV, VI: Rightward gaze preference, unable to cross midline VII: Left facial droop noted VIII: Hearing is intact to voice XI: Shoulder shrug is absent on the left XII: Tongue protrudes midline  Motor: Tone is normal. Bulk is normal.  Normal antigravity strength in right upper extremity.  Left upper extremity only shows trace movement in the fingers.  Bilateral AKAs. Right leg elevates antigravity, left leg is without movement.  Sensory: Appears intact to light touch throughout Cerebellar: FNF intact on the right  ASSESSMENT/PLAN Ms. BRADY PLANT is a 67 y.o. female with history of CAD, GERD, CVA in March of 2023, HTN, HLD, PAD with bilateral AKA, AF on Eliquis, AICD, Sjogren's syndrome, and cardiomyopathy presenting with right gaze deviation and left-sided weakness. LKW 08/28/22 15:45.  Stroke: Acute right hemispheric infarct due to acute right ICA terminus occlusion.  Etiology is likely embolic secondary to atrial fibrillation s/p IR mechanical thrombectomy with direct contact aspiration of right ICA, MCA, and ACA with complete recanalization.  Code Stroke asymmetric hyperdensity in the right M1 segment concerning  for acute thrombus.  Slight asymmetric hypoattenuation in the right lentiform nucleus and insular ribbon.  This could represent early infarct. ASPECTS 8/10.  Stable mild periventricular white matter disease.  This likely reflects sequela of chronic microvascular ischemia. CTA head & neck occlusion of the right ICA in its distal segment without reconstitution prior to the skull base.  The right ICA is occluded through the ICA terminus.  Right M1 and A1 segments are occluded.  Minimal filling of distal right MCA branch vessels with some collaterals.  Atherosclerotic changes of the right carotid bifurcation and cavernous left ICA without significant stenosis.  Left posterior cerebral artery. Cerebral angio occlusion of the right ICA terminus identified.  Mechanical thrombectomy performed with direct contact aspiration and combined aspiration and stent retriever in the ICA, MCA, and ACA achieving complete recanalization.  Flat panel CT showed contrast staining in the right basal ganglia. Post IR CT pending MRI unable to obtain due to PPM. 2D Echo EF 55 to 46%, grade 1 diastolic dysfunction, no atrial level shunt, normal left atrial size LDL 82 HgbA1c 6.6 VTE prophylaxis -Lovenox    Diet   Diet NPO time  specified   Eliquis (apixaban) daily prior to admission, now on No antithrombotic. Eliquis held in the setting of acute CVA.  Therapy recommendations: SNF Disposition: Pending  History of CVA in March 2023 Presenting with speech disturbance and right upper extremity weakness Following her CVA in March, patient had residual dysarthria and improving RUE weakness It was felt that her stroke was due to PAF with Eliquis nonadherence as patient stopped taking AC as she perceived "it was not doing anything for me" Eliquis restarted during this hospitalization 5 mg twice daily No neurology outpatient follow up available for review on EMAR  Paroxysmal atrial fibrillation Previously noncompliant with CVA in  March of 2023 Patient endorses current compliance with Eliquis   Hypertension Home meds: Lopressor Unstable Systolic blood pressure goal less than 160, currently still requiring Cleviprex As needed labetalol and hydralazine Will start oral blood pressure medications when enteral access achieved Long-term BP goal normotensive  Hyperlipidemia CAD, PAD s/p bilateral AKA Home meds: Crestor 40 mg, resumed in hospital LDL 82, goal < 70 Continue statin at discharge  Other Stroke Risk Factors Advanced Age >/= 28  Former cigarette smoker Hx stroke/TIA Coronary artery disease, MI s/p PPM Afib with a history of nonadherence to Eliquis  Other Active Problems PAD s/p bilateral AKA Sjogren syndrome  Hospital day # [place] , MSN, AGACNP-BC [facility] See Amion for schedule and pager information 08/30/2022 1:01 PM   I have personally obtained history,examined this patient, reviewed notes, independently viewed imaging studies, participated in medical decision making and plan of care.ROS completed by me personally and pertinent positives fully documented  I have made any additions or clarifications directly to the above note. Agree with note above.  Patient remains neurologically stable but has not shown significant improvement.  She is unable to swallow hence will need core track tube placement today.  Continue strict blood pressure control and mobilize out of bed and therapy consults.  Transfer to neurology floor bed.  We will hold off on anticoagulation due to hemorrhagic infarct.  Greater than 50% time during this 50-minute visit was spent on counseling and coordination of care about stroke and discussion with patient and care team and answering questions.  Antony Contras, MD Medical Director [facility] St[facility] Center Pager: [phone] 08/30/2022 3:07 PM  To contact Stroke Continuity provider, please refer to [email or web address] After hours, contact General Neurology

## 2022-08-30 NOTE — Progress Notes (Signed)
Initial Nutrition Assessment  DOCUMENTATION CODES:   Underweight, Severe malnutrition in context of chronic illness  INTERVENTION:   Initiate tube feeding via Cortrak tube once placed: Osmolite 1.2 at 20 ml/h and increase by 10 ml every 8 hours to goal rate of 50 ml/hr (1200 ml per day) Prosource TF20 60 ml daily  Provides 1520 kcal, 86 gm protein, 973 ml free water daily  Monitor magnesium and phosphorus every 12 hours x 4 occurrences, MD to replete as needed, as pt is at risk for refeeding syndrome given severe malnutrition.    NUTRITION DIAGNOSIS:   Severe Malnutrition related to chronic illness as evidenced by severe muscle depletion, severe fat depletion.  GOAL:   Patient will meet greater than or equal to 90% of their needs  MONITOR:   TF tolerance  REASON FOR ASSESSMENT:   Consult Enteral/tube feeding initiation and management  ASSESSMENT:   Pt with PAD s/p B AKAs, Takotsubo cardiomyopathy, GERD, depression, HLD, HTN, CAD, SLE, sjogren's syndrome, anxiety, and RA admitted from home where she was independent with R ICA occlusion s/p thrombectomy.   Pt discussed during ICU rounds and with RN.  Pt with expressive aphasia and unable to answer any questions. Per RN pt uses wheelchair at home and is independent of ADLs including cooking for herself.  No family present.  Spoke with MD, ok to start TF after cortrak placement today.   Medications reviewed and pt on Lovenox only  Labs reviewed:125/2.2  CBG: 140  UOP: 500 ml    NUTRITION - FOCUSED PHYSICAL EXAM:  Flowsheet Row Most Recent Value  Orbital Region Moderate depletion  Upper Arm Region Severe depletion  Thoracic and Lumbar Region Severe depletion  Buccal Region Moderate depletion  Temple Region Moderate depletion  Clavicle Bone Region Moderate depletion  Clavicle and Acromion Bone Region Severe depletion  Scapular Bone Region Severe depletion  Dorsal Hand Severe depletion  Patellar Region Unable  to assess  Anterior Thigh Region Unable to assess  Posterior Calf Region Unable to assess  Edema (RD Assessment) None  Hair Reviewed  Eyes Reviewed  Mouth Unable to assess  Skin Reviewed  Nails Reviewed       Diet Order:   Diet Order             Diet NPO time specified  Diet effective now                   EDUCATION NEEDS:   No education needs have been identified at this time  Skin:  Skin Assessment: Reviewed RN Assessment  Last BM:  unknown  Height:   Ht Readings from Last 1 Encounters:  07/05/22 5\' 5"  (1.651 m)    Weight:   Wt Readings from Last 1 Encounters:  08/28/22 43.4 kg    Ideal Body Weight:  47.7 kg (post B AKA)  BMI:  Body mass index is 15.92 kg/m. Adjusted BMI: 17.5  Estimated Nutritional Needs:   Kcal:  1400-1600  Protein:  70-95 grams  Fluid:  >1.5 L/day  Lockie Pares., RD, LDN, CNSC See AMiON for contact information

## 2022-08-30 NOTE — Progress Notes (Cosign Needed Addendum)
Supervising Physician: Pedro Earls  Patient Status:  [facility] - In-pt  Chief Complaint:  2 days s/p R ICA recanalization  Subjective: Lying in bed, awake, NG in place   Allergies: Brilinta [ticagrelor], Latex, and Tape  Medications: Prior to Admission medications   Medication Sig Start Date End Date Taking? Authorizing Provider  acetaminophen (TYLENOL) 500 MG tablet Take by mouth.   Yes [provider]  ELIQUIS 5 MG TABS tablet TAKE 1 TABLET BY MOUTH TWICE  DAILY 04/20/22  Yes Burns, Claudina Lick, MD  metoprolol tartrate (LOPRESSOR) 25 MG tablet Take 0.5 tablets (12.5 mg total) by mouth 2 (two) times daily. Patient taking differently: Take by mouth. 01/05/22  Yes Burns, Claudina Lick, MD  nitroGLYCERIN (NITROSTAT) 0.4 MG SL tablet Place 1 tablet (0.4 mg total) under the tongue every 5 (five) minutes as needed for chest pain. 12/29/21  Yes Burns, Claudina Lick, MD  rosuvastatin (CRESTOR) 40 MG tablet Take 1 tablet (40 mg total) by mouth daily. 06/29/22  Yes Sande Rives E, PA-C     Vital Signs: BP 136/63   Pulse 61   Temp 99.4 F (37.4 C) (Oral)   Resp 15   Wt 95 lb 10.9 oz (43.4 kg)   SpO2 97%   BMI 15.92 kg/m   Physical Exam Constitutional:      General: She is not in acute distress. HENT:     Head: Normocephalic and atraumatic.  Cardiovascular:     Rate and Rhythm: Normal rate and regular rhythm.     Pulses: Normal pulses.  Pulmonary:     Effort: Pulmonary effort is normal. No respiratory distress.  Abdominal:     General: Abdomen is flat.     Palpations: Abdomen is soft.  Musculoskeletal:     Comments: Bilateral lower extremity AKA amputations  Skin:    General: Skin is warm and dry.     Comments: R CFA access site soft, clean, warm, and dry.  Non-tender to palpation  Neurological:     Mental Status: She is alert.     Comments: Dysarthric but able to speak name and birthday.  She primarily shakes head "yes" or "no" to communicate and  responses are appropriate.  She is oriented to place. Eyes do cross midline but are slow to shift and focus fully on the left   Imaging: DG Abd Portable 1V  Result Date: 08/30/2022 CLINICAL DATA:  Feeding tube placement. EXAM: PORTABLE ABDOMEN - 1 VIEW COMPARISON:  Abdominopelvic CT 01/28/2019 FINDINGS: Tip of the weighted enteric tube is in the midline in the region of the distal stomach. There is question of excreted IV contrast within a dilated right renal collecting system. Nonobstructive occluded bowel-gas pattern. IMPRESSION: 1. Tip of the weighted enteric tube in the region of the distal stomach. 2. Question of excreted IV contrast within a dilated right renal collecting system. Consider renal ultrasound to assess for hydronephrosis. Electronically Signed   By: Keith Rake M.D.   On: 08/30/2022 16:12   CT HEAD WO CONTRAST (5MM)  Result Date: 08/29/2022 CLINICAL DATA:  Stroke, follow-up EXAM: CT HEAD WITHOUT CONTRAST TECHNIQUE: Contiguous axial images were obtained from the base of the skull through the vertex without intravenous contrast. RADIATION DOSE REDUCTION: This exam was performed according to the departmental dose-optimization program which includes automated exposure control, adjustment of the mA and/or kV according to patient size and/or use of iterative reconstruction technique. COMPARISON:  08/28/2022 CT head and post percutaneous intervention  CT head FINDINGS: Brain: Hypodensity in the right basal ganglia, with mild central hyperdensity measuring 2.3 x 1.2 x 2.0 cm (AP x TR x CC) (series 3, image 16), which may represent infarct with petechial hemorrhage versus contrast staining in an area of infarction, which appears similar in size to the postprocedural CT but decreased in density. Additional hyperdensity is seen at the lateral aspect of the right basal ganglia (series 3, image 17) and in the inter hemispheric fissure, series 3, image 13), similar to post procedural CT; this could  also represent subarachnoid hemorrhage versus contrast staining. Mild edema is associated with the right basal ganglia, with mild local mass effect and effacement of the right lateral ventricle, but no significant midline shift. No evidence of additional acute infarct, mass, or hydrocephalus. Vascular: No hyperdense vessel. Skull: No acute osseous abnormality. Sinuses/Orbits: Minimal mucosal thickening in the maxillary sinuses. No acute finding in the orbits. Other: Trace fluid in right mastoid air cells. IMPRESSION: 1. Hypodensity in the right basal ganglia, with mild central hyperdensity measuring 2.3 x 1.2 x 2.0 cm, likely acute infarct with petechial hemorrhage versus contrast staining. The hyperdense area appears similar in size to the post thrombectomy CT but decreased in density. Heidelberg classification 1b: HI2, confluent petechiae, no mass effect. 2. Additional hyperdensity at the lateral aspect of the right basal ganglia and in the interhemispheric fissure, similar to the postprocedural CT, which represent subarachnoid hemorrhage versus contrast staining. 3. Mild edema with narrowing of the right lateral ventricle but no significant midline shift. Electronically Signed   By: Merilyn Baba M.D.   On: 08/29/2022 17:42   IR PERCUTANEOUS ART THROMBECTOMY/INFUSION INTRACRANIAL INC DIAG ANGIO  Result Date: 08/29/2022 INDICATION: 67 year old female presenting with right gaze deviation left-sided weakness; NIHSS 19. Her last known well was 3:45 p.m. on 08/28/2022. Her past medical history significant for CAD, GERD, CVA, hypertension, hyperlipidemia, peripheral arterial disease with bilateral above-knee amputations, atrial fibrillation on Eliquis, AICD placement, Sjogren syndrome and cardiomyopathy; baseline modified Rankin scale 3. Head CT showed blurring of the right basal ganglia and insular cortex (ASPECTS 8). No IV trouble lytic administered due to patient on Eliquis. CT angiogram of the head and neck  showed an occlusion of the right ICA terminus extending to the proximal A1/ACA as well as along the M1 and proximal M2/MCA segments. She was transferred to our service or mechanical thrombectomy. EXAM: ULTRASOUND-GUIDED VASCULAR ACCESS DIAGNOSTIC CEREBRAL ANGIOGRAM MECHANICAL THROMBECTOMY FLAT PANEL HEAD CT COMPARISON:  CT/CT angiogram of the neck August 28, 2022. MEDICATIONS: No antibiotics utilized. ANESTHESIA/SEDATION: The procedure was under general anesthesia. CONTRAST:  60 mL Omnipaque 300 mg per. FLUOROSCOPY: Radiation Exposure Index (as provided by the fluoroscopic device): 6301 mGy Kerma COMPLICATIONS: None immediate. TECHNIQUE: Informed written consent was obtained from the patient's daughter after a thorough discussion of the procedural risks, benefits and alternatives. All questions were addressed. Maximal Sterile Barrier Technique was utilized including caps, mask, sterile gowns, sterile gloves, sterile drape, hand hygiene and skin antiseptic. A timeout was performed prior to the initiation of the procedure. The right groin was prepped and draped in the usual sterile fashion. Using a micropuncture kit and the modified Seldinger technique, access was gained to the right common femoral artery and an 8 French sheath was placed. Real-time ultrasound guidance was utilized for vascular access including the acquisition of a permanent ultrasound image documenting patency of the accessed vessel. Under fluoroscopy, a Zoom 88 guide catheter was navigated over a 6 [place] Berenstein 2 catheter and a  0.035" Terumo Glidewire into the aortic arch. The catheter was placed into the right common carotid artery and then advanced into the right internal carotid artery. The diagnostic catheter was removed. Frontal and lateral angiograms of the head were obtained. FINDINGS: 1. Increased wall thickness of the right common femoral artery seen on ultrasound. Clot is seen occluding the distal right common femoral artery. The  proximal aspect of the arteries patent, adequate for vascular access. 2. Occlusion of the intracranial right ICA at the communicating segment. PROCEDURE: Using biplane roadmap guidance, a zoom 71 aspiration catheter was navigated over Colossus 35 microguidewire into the cavernous segment of the right ICA. The aspiration catheter was then advanced to the M1 segment and connected to an aspiration pump. Continuous aspiration was performed for 2 minutes. The guide catheter was connected to a VacLok syringe. The aspiration catheter was subsequently removed under constant aspiration. The guide catheter was aspirated for debris. Right internal carotid artery angiograms with frontal and lateral views of the head showed recanalization of the intracranial right ICA proximal M1/MCA and A1/ACA. Occlusion is noted at the mid M1/MCA segment and A3-A4/ACA. Using biplane roadmap guidance, a zoom 71 aspiration catheter was navigated over Colossus 35 microguidewire into the cavernous segment of the right ICA. The aspiration catheter was then advanced to the level of occlusion in the M2 segment and connected to an aspiration pump. Continuous aspiration was performed for 2 minutes. The guide catheter was connected to a VacLok syringe. The aspiration catheter was subsequently removed under constant aspiration. The guide catheter was aspirated for debris. Right internal carotid artery angiograms with frontal and lateral views of the head to recanalization of the M1 segment and M2 posterior division branches. Nonocclusive clot is seen at the distal M1 segment with persistent occlusion of the anterior division branch. Using biplane roadmap guidance, a zoom 55 aspiration catheter was navigated over a phenom 21 microcatheter and an Aristotle 14 microguidewire into the cavernous segment of the right ICA. The microcatheter was then navigated over the wire into the right M2/MCA. Then, a 3 x 40 mm solitaire stent retriever was deployed spanning  the M1 and M2 segments. The device was allowed to intercalated with the clot for 4 minutes. The microcatheter was removed. The aspiration catheter was advanced to the level of occlusion and connected to an aspiration pump. The thrombectomy device and aspiration catheter were removed under constant aspiration. The guide catheter was aspirated for debris. Right internal carotid artery angiograms with frontal and lateral views of the head showed complete recanalization of the right MCA vascular tree. Underlying severe stenosis at the origin of an M2 anterior division branch was seen. Persistent occlusion at the A3-A4/ACA noted. Using biplane roadmap guidance, a zoom 55 aspiration catheter was navigated over a phenom 21 microcatheter and an Aristotle 14 microguidewire into the cavernous segment of the right ICA. The microcatheter was then navigated over the wire into the distal right pericallosal artery. Then, a 3 x 40 mm solitaire stent retriever was deployed spanning the A3 and A4 segments. The device was allowed to intercalated with the clot for 4 minutes. The microcatheter was removed. The aspiration catheter was advanced to the A2 level and connected to an aspiration pump. The thrombectomy device and aspiration catheter were removed under constant aspiration. The guide catheter was aspirated for debris. Right internal carotid artery angiograms with frontal lateral views of the head showed recanalization of the A3 segment with persistent occlusion of the A4 segment. Using biplane roadmap guidance, a  zoom 55 aspiration catheter was navigated over a phenom 21 microcatheter and an Aristotle 14 microguidewire into the cavernous segment of the right ICA. The microcatheter was then navigated over the wire into the distal right pericallosal artery. Then, a 3 x 40 mm solitaire stent retriever was deployed spanning the A3 and A4 segments. The device was allowed to intercalated with the clot for 4 minutes. The microcatheter  was removed. The aspiration catheter was advanced to the A2 level and connected to an aspiration pump. The thrombectomy device and aspiration catheter were removed under constant aspiration. The guide catheter was aspirated for debris. Right internal carotid artery angiograms with frontal and lateral views of the head showed complete recanalization of the right ACA vascular tree. No evidence of embolus to new territory. Flat panel CT of the head was obtained and post processed in a separate workstation with concurrent attending physician supervision. Selected images were sent to PACS. Contrast staining of the right basal ganglia is noted, suggesting ongoing ischemia. Trace hyperdensity are noted in the interhemispheric fissure and right sylvian fissure which could represent contrast staining versus small subarachnoid hemorrhage. The catheter was subsequently withdrawn. Right common femoral artery angiogram was obtained in right anterior oblique view. The puncture is at the level of the common femoral artery. The artery is patent at and above the level of puncture with appropriate caliber for closure device. The sheath was exchanged over the wire for a Perclose prostyle which was utilized for access closure. Immediate hemostasis was achieved. IMPRESSION: Successful mechanical thrombectomy for treatment of an intracranial right ICA "T" occlusion. Complete recanalization of the right ICA, MCA and ACA vascular trees achieved (TICI 3). PLAN: Patient was transferred to ICU in stable condition. Electronically Signed   By: Pedro Earls M.D.   On: 08/29/2022 16:37   IR US Guide Vasc Access Right  Result Date: 08/29/2022 INDICATION: 67 year old female presenting with right gaze deviation left-sided weakness; NIHSS 19. Her last known well was 3:45 p.m. on 08/28/2022. Her past medical history significant for CAD, GERD, CVA, hypertension, hyperlipidemia, peripheral arterial disease with bilateral above-knee  amputations, atrial fibrillation on Eliquis, AICD placement, Sjogren syndrome and cardiomyopathy; baseline modified Rankin scale 3. Head CT showed blurring of the right basal ganglia and insular cortex (ASPECTS 8). No IV trouble lytic administered due to patient on Eliquis. CT angiogram of the head and neck showed an occlusion of the right ICA terminus extending to the proximal A1/ACA as well as along the M1 and proximal M2/MCA segments. She was transferred to our service or mechanical thrombectomy. EXAM: ULTRASOUND-GUIDED VASCULAR ACCESS DIAGNOSTIC CEREBRAL ANGIOGRAM MECHANICAL THROMBECTOMY FLAT PANEL HEAD CT COMPARISON:  CT/CT angiogram of the neck August 28, 2022. MEDICATIONS: No antibiotics utilized. ANESTHESIA/SEDATION: The procedure was under general anesthesia. CONTRAST:  60 mL Omnipaque 300 mg per. FLUOROSCOPY: Radiation Exposure Index (as provided by the fluoroscopic device): 0454 mGy Kerma COMPLICATIONS: None immediate. TECHNIQUE: Informed written consent was obtained from the patient's daughter after a thorough discussion of the procedural risks, benefits and alternatives. All questions were addressed. Maximal Sterile Barrier Technique was utilized including caps, mask, sterile gowns, sterile gloves, sterile drape, hand hygiene and skin antiseptic. A timeout was performed prior to the initiation of the procedure. The right groin was prepped and draped in the usual sterile fashion. Using a micropuncture kit and the modified Seldinger technique, access was gained to the right common femoral artery and an 8 French sheath was placed. Real-time ultrasound guidance was utilized for vascular access  including the acquisition of a permanent ultrasound image documenting patency of the accessed vessel. Under fluoroscopy, a Zoom 88 guide catheter was navigated over a 6 [place] Berenstein 2 catheter and a 0.035" Terumo Glidewire into the aortic arch. The catheter was placed into the right common carotid artery and then  advanced into the right internal carotid artery. The diagnostic catheter was removed. Frontal and lateral angiograms of the head were obtained. FINDINGS: 1. Increased wall thickness of the right common femoral artery seen on ultrasound. Clot is seen occluding the distal right common femoral artery. The proximal aspect of the arteries patent, adequate for vascular access. 2. Occlusion of the intracranial right ICA at the communicating segment. PROCEDURE: Using biplane roadmap guidance, a zoom 71 aspiration catheter was navigated over Colossus 35 microguidewire into the cavernous segment of the right ICA. The aspiration catheter was then advanced to the M1 segment and connected to an aspiration pump. Continuous aspiration was performed for 2 minutes. The guide catheter was connected to a VacLok syringe. The aspiration catheter was subsequently removed under constant aspiration. The guide catheter was aspirated for debris. Right internal carotid artery angiograms with frontal and lateral views of the head showed recanalization of the intracranial right ICA proximal M1/MCA and A1/ACA. Occlusion is noted at the mid M1/MCA segment and A3-A4/ACA. Using biplane roadmap guidance, a zoom 71 aspiration catheter was navigated over Colossus 35 microguidewire into the cavernous segment of the right ICA. The aspiration catheter was then advanced to the level of occlusion in the M2 segment and connected to an aspiration pump. Continuous aspiration was performed for 2 minutes. The guide catheter was connected to a VacLok syringe. The aspiration catheter was subsequently removed under constant aspiration. The guide catheter was aspirated for debris. Right internal carotid artery angiograms with frontal and lateral views of the head to recanalization of the M1 segment and M2 posterior division branches. Nonocclusive clot is seen at the distal M1 segment with persistent occlusion of the anterior division branch. Using biplane roadmap  guidance, a zoom 55 aspiration catheter was navigated over a phenom 21 microcatheter and an Aristotle 14 microguidewire into the cavernous segment of the right ICA. The microcatheter was then navigated over the wire into the right M2/MCA. Then, a 3 x 40 mm solitaire stent retriever was deployed spanning the M1 and M2 segments. The device was allowed to intercalated with the clot for 4 minutes. The microcatheter was removed. The aspiration catheter was advanced to the level of occlusion and connected to an aspiration pump. The thrombectomy device and aspiration catheter were removed under constant aspiration. The guide catheter was aspirated for debris. Right internal carotid artery angiograms with frontal and lateral views of the head showed complete recanalization of the right MCA vascular tree. Underlying severe stenosis at the origin of an M2 anterior division branch was seen. Persistent occlusion at the A3-A4/ACA noted. Using biplane roadmap guidance, a zoom 55 aspiration catheter was navigated over a phenom 21 microcatheter and an Aristotle 14 microguidewire into the cavernous segment of the right ICA. The microcatheter was then navigated over the wire into the distal right pericallosal artery. Then, a 3 x 40 mm solitaire stent retriever was deployed spanning the A3 and A4 segments. The device was allowed to intercalated with the clot for 4 minutes. The microcatheter was removed. The aspiration catheter was advanced to the A2 level and connected to an aspiration pump. The thrombectomy device and aspiration catheter were removed under constant aspiration. The guide catheter was aspirated  for debris. Right internal carotid artery angiograms with frontal lateral views of the head showed recanalization of the A3 segment with persistent occlusion of the A4 segment. Using biplane roadmap guidance, a zoom 55 aspiration catheter was navigated over a phenom 21 microcatheter and an Aristotle 14 microguidewire into the  cavernous segment of the right ICA. The microcatheter was then navigated over the wire into the distal right pericallosal artery. Then, a 3 x 40 mm solitaire stent retriever was deployed spanning the A3 and A4 segments. The device was allowed to intercalated with the clot for 4 minutes. The microcatheter was removed. The aspiration catheter was advanced to the A2 level and connected to an aspiration pump. The thrombectomy device and aspiration catheter were removed under constant aspiration. The guide catheter was aspirated for debris. Right internal carotid artery angiograms with frontal and lateral views of the head showed complete recanalization of the right ACA vascular tree. No evidence of embolus to new territory. Flat panel CT of the head was obtained and post processed in a separate workstation with concurrent attending physician supervision. Selected images were sent to PACS. Contrast staining of the right basal ganglia is noted, suggesting ongoing ischemia. Trace hyperdensity are noted in the interhemispheric fissure and right sylvian fissure which could represent contrast staining versus small subarachnoid hemorrhage. The catheter was subsequently withdrawn. Right common femoral artery angiogram was obtained in right anterior oblique view. The puncture is at the level of the common femoral artery. The artery is patent at and above the level of puncture with appropriate caliber for closure device. The sheath was exchanged over the wire for a Perclose prostyle which was utilized for access closure. Immediate hemostasis was achieved. IMPRESSION: Successful mechanical thrombectomy for treatment of an intracranial right ICA "T" occlusion. Complete recanalization of the right ICA, MCA and ACA vascular trees achieved (TICI 3). PLAN: Patient was transferred to ICU in stable condition. Electronically Signed   By: Pedro Earls M.D.   On: 08/29/2022 16:37   IR CT Head Ltd  Result Date:  08/29/2022 INDICATION: 67 year old female presenting with right gaze deviation left-sided weakness; NIHSS 19. Her last known well was 3:45 p.m. on 08/28/2022. Her past medical history significant for CAD, GERD, CVA, hypertension, hyperlipidemia, peripheral arterial disease with bilateral above-knee amputations, atrial fibrillation on Eliquis, AICD placement, Sjogren syndrome and cardiomyopathy; baseline modified Rankin scale 3. Head CT showed blurring of the right basal ganglia and insular cortex (ASPECTS 8). No IV trouble lytic administered due to patient on Eliquis. CT angiogram of the head and neck showed an occlusion of the right ICA terminus extending to the proximal A1/ACA as well as along the M1 and proximal M2/MCA segments. She was transferred to our service or mechanical thrombectomy. EXAM: ULTRASOUND-GUIDED VASCULAR ACCESS DIAGNOSTIC CEREBRAL ANGIOGRAM MECHANICAL THROMBECTOMY FLAT PANEL HEAD CT COMPARISON:  CT/CT angiogram of the neck August 28, 2022. MEDICATIONS: No antibiotics utilized. ANESTHESIA/SEDATION: The procedure was under general anesthesia. CONTRAST:  60 mL Omnipaque 300 mg per. FLUOROSCOPY: Radiation Exposure Index (as provided by the fluoroscopic device): 6803 mGy Kerma COMPLICATIONS: None immediate. TECHNIQUE: Informed written consent was obtained from the patient's daughter after a thorough discussion of the procedural risks, benefits and alternatives. All questions were addressed. Maximal Sterile Barrier Technique was utilized including caps, mask, sterile gowns, sterile gloves, sterile drape, hand hygiene and skin antiseptic. A timeout was performed prior to the initiation of the procedure. The right groin was prepped and draped in the usual sterile fashion. Using a micropuncture  kit and the modified Seldinger technique, access was gained to the right common femoral artery and an 8 French sheath was placed. Real-time ultrasound guidance was utilized for vascular access including the  acquisition of a permanent ultrasound image documenting patency of the accessed vessel. Under fluoroscopy, a Zoom 88 guide catheter was navigated over a 6 [place] Berenstein 2 catheter and a 0.035" Terumo Glidewire into the aortic arch. The catheter was placed into the right common carotid artery and then advanced into the right internal carotid artery. The diagnostic catheter was removed. Frontal and lateral angiograms of the head were obtained. FINDINGS: 1. Increased wall thickness of the right common femoral artery seen on ultrasound. Clot is seen occluding the distal right common femoral artery. The proximal aspect of the arteries patent, adequate for vascular access. 2. Occlusion of the intracranial right ICA at the communicating segment. PROCEDURE: Using biplane roadmap guidance, a zoom 71 aspiration catheter was navigated over Colossus 35 microguidewire into the cavernous segment of the right ICA. The aspiration catheter was then advanced to the M1 segment and connected to an aspiration pump. Continuous aspiration was performed for 2 minutes. The guide catheter was connected to a VacLok syringe. The aspiration catheter was subsequently removed under constant aspiration. The guide catheter was aspirated for debris. Right internal carotid artery angiograms with frontal and lateral views of the head showed recanalization of the intracranial right ICA proximal M1/MCA and A1/ACA. Occlusion is noted at the mid M1/MCA segment and A3-A4/ACA. Using biplane roadmap guidance, a zoom 71 aspiration catheter was navigated over Colossus 35 microguidewire into the cavernous segment of the right ICA. The aspiration catheter was then advanced to the level of occlusion in the M2 segment and connected to an aspiration pump. Continuous aspiration was performed for 2 minutes. The guide catheter was connected to a VacLok syringe. The aspiration catheter was subsequently removed under constant aspiration. The guide catheter was  aspirated for debris. Right internal carotid artery angiograms with frontal and lateral views of the head to recanalization of the M1 segment and M2 posterior division branches. Nonocclusive clot is seen at the distal M1 segment with persistent occlusion of the anterior division branch. Using biplane roadmap guidance, a zoom 55 aspiration catheter was navigated over a phenom 21 microcatheter and an Aristotle 14 microguidewire into the cavernous segment of the right ICA. The microcatheter was then navigated over the wire into the right M2/MCA. Then, a 3 x 40 mm solitaire stent retriever was deployed spanning the M1 and M2 segments. The device was allowed to intercalated with the clot for 4 minutes. The microcatheter was removed. The aspiration catheter was advanced to the level of occlusion and connected to an aspiration pump. The thrombectomy device and aspiration catheter were removed under constant aspiration. The guide catheter was aspirated for debris. Right internal carotid artery angiograms with frontal and lateral views of the head showed complete recanalization of the right MCA vascular tree. Underlying severe stenosis at the origin of an M2 anterior division branch was seen. Persistent occlusion at the A3-A4/ACA noted. Using biplane roadmap guidance, a zoom 55 aspiration catheter was navigated over a phenom 21 microcatheter and an Aristotle 14 microguidewire into the cavernous segment of the right ICA. The microcatheter was then navigated over the wire into the distal right pericallosal artery. Then, a 3 x 40 mm solitaire stent retriever was deployed spanning the A3 and A4 segments. The device was allowed to intercalated with the clot for 4 minutes. The microcatheter was removed. The  aspiration catheter was advanced to the A2 level and connected to an aspiration pump. The thrombectomy device and aspiration catheter were removed under constant aspiration. The guide catheter was aspirated for debris. Right  internal carotid artery angiograms with frontal lateral views of the head showed recanalization of the A3 segment with persistent occlusion of the A4 segment. Using biplane roadmap guidance, a zoom 55 aspiration catheter was navigated over a phenom 21 microcatheter and an Aristotle 14 microguidewire into the cavernous segment of the right ICA. The microcatheter was then navigated over the wire into the distal right pericallosal artery. Then, a 3 x 40 mm solitaire stent retriever was deployed spanning the A3 and A4 segments. The device was allowed to intercalated with the clot for 4 minutes. The microcatheter was removed. The aspiration catheter was advanced to the A2 level and connected to an aspiration pump. The thrombectomy device and aspiration catheter were removed under constant aspiration. The guide catheter was aspirated for debris. Right internal carotid artery angiograms with frontal and lateral views of the head showed complete recanalization of the right ACA vascular tree. No evidence of embolus to new territory. Flat panel CT of the head was obtained and post processed in a separate workstation with concurrent attending physician supervision. Selected images were sent to PACS. Contrast staining of the right basal ganglia is noted, suggesting ongoing ischemia. Trace hyperdensity are noted in the interhemispheric fissure and right sylvian fissure which could represent contrast staining versus small subarachnoid hemorrhage. The catheter was subsequently withdrawn. Right common femoral artery angiogram was obtained in right anterior oblique view. The puncture is at the level of the common femoral artery. The artery is patent at and above the level of puncture with appropriate caliber for closure device. The sheath was exchanged over the wire for a Perclose prostyle which was utilized for access closure. Immediate hemostasis was achieved. 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 Complete recanalization of the right ICA, MCA and ACA vascular trees achieved (TICI 3). PLAN: Patient was transferred to ICU in stable condition. Electronically Signed   By: Pedro Earls M.D.   On: 08/29/2022 16:37   ECHOCARDIOGRAM COMPLETE  Result Date: 08/29/2022    ECHOCARDIOGRAM REPORT   Patient Name:   Mercedes [name] Date of Exam: 08/29/2022 Medical Rec #:  [id number]         Height:       65.0 in Accession #:    [id number]        Weight:       95.7 lb Date of Birth:  08/17/1956         BSA:          1.446 m Patient Age:    15 years          BP:           124/70 mmHg Patient Gender: F                 HR:           73 bpm. Exam Location:  Inpatient Procedure: 2D Echo, Color Doppler, Cardiac Doppler and Intracardiac            Opacification Agent Indications:    Stroke I63.9  History:        Patient has prior history of Echocardiogram examinations, most                 recent 11/22/2021. Cardiomyopathy, CAD and  Previous Myocardial                 Infarction, Stroke and PAD, Arrythmias:Atrial Fibrillation; Risk                 Factors:Dyslipidemia, Hypertension and Former Smoker.  Sonographer:    Greer Pickerel Referring Phys: [id number] [facility]  Sonographer Comments: Suboptimal apical window. Image acquisition challenging due to patient body habitus and Image acquisition challenging due to respiratory motion. IMPRESSIONS  1. Left ventricular ejection fraction, by estimation, is 55 to 60%. The left ventricle has normal function. The left ventricle demonstrates regional wall motion abnormalities (see scoring diagram/findings for description). Left ventricular diastolic parameters are consistent with Grade I diastolic dysfunction (impaired relaxation). There is moderate hypokinesis of the left ventricular, apical inferior wall and septal wall.  2. Right ventricular systolic function is normal. The right ventricular size is normal.  3. The mitral valve is  normal in structure. No evidence of mitral valve regurgitation. No evidence of mitral stenosis.  4. The aortic valve is normal in structure. Aortic valve regurgitation is not visualized. No aortic stenosis is present.  5. The inferior vena cava is normal in size with greater than 50% respiratory variability, suggesting right atrial pressure of 3 mmHg. Comparison(s): Prior images reviewed side by side. The left ventricular wall motion abnormality is new. However, it fits well with the nuclear perfusion defect described 09/02/2020. Suspect the wall motion abnormality is actually old, just better defined  on today's study due to the use of Definity contrast FINDINGS  Left Ventricle: No left ventricular thrombus is seen (Definity contrast used). Left ventricular ejection fraction, by estimation, is 55 to 60%. The left ventricle has normal function. The left ventricle demonstrates regional wall motion abnormalities. Moderate hypokinesis of the left ventricular, apical apical segment, inferior wall and septal wall. Definity contrast agent was given IV to delineate the left ventricular endocardial borders. The left ventricular internal cavity size was normal in size. There is no left ventricular hypertrophy. Left ventricular diastolic parameters are consistent with Grade I diastolic dysfunction (impaired relaxation). Normal left ventricular filling pressure.  LV Wall Scoring: The apical septal segment, apical anterior segment, apical inferior segment, and apex are hypokinetic. Right Ventricle: The right ventricular size is normal. No increase in right ventricular wall thickness. Right ventricular systolic function is normal. Left Atrium: Left atrial size was normal in size. Right Atrium: Right atrial size was normal in size. Pericardium: There is no evidence of pericardial effusion. Mitral Valve: The mitral valve is normal in structure. No evidence of mitral valve regurgitation. No evidence of mitral valve stenosis.  Tricuspid Valve: The tricuspid valve is normal in structure. Tricuspid valve regurgitation is not demonstrated. No evidence of tricuspid stenosis. Aortic Valve: The aortic valve is normal in structure. Aortic valve regurgitation is not visualized. No aortic stenosis is present. Pulmonic Valve: The pulmonic valve was normal in structure. Pulmonic valve regurgitation is not visualized. No evidence of pulmonic stenosis. Aorta: The aortic root is normal in size and structure. Venous: The inferior vena cava is normal in size with greater than 50% respiratory variability, suggesting right atrial pressure of 3 mmHg. IAS/Shunts: No atrial level shunt detected by color flow Doppler.  LEFT VENTRICLE PLAX 2D LVIDd:         4.10 cm   Diastology LVIDs:         3.50 cm   LV e' medial:    5.22 cm/s LV PW:  1.80 cm   LV E/e' medial:  11.5 LV IVS:        0.80 cm   LV e' lateral:   6.85 cm/s LVOT diam:     2.10 cm   LV E/e' lateral: 8.8 LV SV:         68 LV SV Index:   47 LVOT Area:     3.46 cm  RIGHT VENTRICLE RV S prime:     9.36 cm/s TAPSE (M-mode): 1.9 cm LEFT ATRIUM             Index        RIGHT ATRIUM           Index LA diam:        2.60 cm 1.80 cm/m   RA Area:     14.70 cm LA Vol (A2C):   86.3 ml 59.68 ml/m  RA Volume:   33.80 ml  23.37 ml/m LA Vol (A4C):   59.0 ml 40.80 ml/m LA Biplane Vol: 76.6 ml 52.97 ml/m  AORTIC VALVE             PULMONIC VALVE LVOT Vmax:   94.00 cm/s  PR End Diast Vel: 3.86 msec LVOT Vmean:  56.200 cm/s LVOT VTI:    0.197 m  AORTA Ao Root diam: 3.50 cm MITRAL VALVE MV Area (PHT): 3.17 cm    SHUNTS MV Decel Time: 239 msec    Systemic VTI:  0.20 m MR Peak grad: 2.7 mmHg     Systemic Diam: 2.10 cm MR Vmax:      82.50 cm/s MV E velocity: 60.00 cm/s MV A velocity: 64.40 cm/s MV E/A ratio:  0.93 Mihai Croitoru MD Electronically signed by Sanda Klein MD Signature Date/Time: 08/29/2022/3:27:24 PM    Final    CT ANGIO HEAD NECK W WO CM (CODE STROKE)  Result Date: 08/28/2022 CLINICAL DATA:   Code stroke. Patient found with left-sided motor weakness and right gaze deviation. EXAM: CT ANGIOGRAPHY HEAD AND NECK TECHNIQUE: Multidetector CT imaging of the head and neck was performed using the standard protocol during bolus administration of intravenous contrast. Multiplanar CT image reconstructions and MIPs were obtained to evaluate the vascular anatomy. Carotid stenosis measurements (when applicable) are obtained utilizing NASCET criteria, using the distal internal carotid diameter as the denominator. RADIATION DOSE REDUCTION: This exam was performed according to the departmental dose-optimization program which includes automated exposure control, adjustment of the mA and/or kV according to patient size and/or use of iterative reconstruction technique. CONTRAST:  69m OMNIPAQUE IOHEXOL 350 MG/ML SOLN COMPARISON:  CTA of the head and neck 11/21/2021 FINDINGS: CTA NECK FINDINGS Aortic arch: A common origin of the left common carotid artery and innominate artery is noted. No significant stenosis is present at the arch. Right carotid system: The right common carotid artery is within normal limits. Atherosclerotic calcifications are present at the bifurcation without a significant stenosis. High-grade stenosis is present in the right external carotid artery. The right internal carotid artery narrows in the neck and is occluded in its distal segment. There is no reconstitution prior to the skull base. Left carotid system: The left common carotid artery is within normal limits. Atherosclerotic changes are present at the carotid bifurcation. The proximal left ICA is narrowed to 1.5 mm. More distal left ICA is normal. It measures 3.8 mm. Vertebral arteries: The vertebral arteries are codominant. Both vertebral arteries originate from the subclavian arteries without significant stenosis. No significant stenosis is present in either vertebral artery in the  neck. Skeleton: Mild degenerative changes of the cervical  spine are stable. No focal osseous lesions are present. Other neck: Soft tissues the neck are otherwise unremarkable. Salivary glands are within normal limits. Thyroid is normal. No significant adenopathy is present. No focal mucosal or submucosal lesions are present. Upper chest: Centrilobular emphysematous changes are present at the lung apices. No nodule or mass lesion is present. No focal airspace disease is present. Interlobular septal thickening is consistent with edema. Review of the MIP images confirms the above findings CTA HEAD FINDINGS Anterior circulation: The right internal carotid artery is occluded. Atherosclerotic calcifications are present in the cavernous left internal carotid artery without significant stenosis. The left A1 and M1 segments are normal. The anterior communicating artery is patent. Both A2 segments fill from the left. The left MCA bifurcation is within normal limits. There is some segmental narrowing in distal left MCA branch vessels without a significant proximal stenosis or occlusion. Minimal filling of distal right MCA branch vessels is present with some collaterals. The right M1 and A1 segments are occluded. Posterior circulation: The PICA origins are visualized and normal bilaterally. Vertebrobasilar junction and basilar artery are normal. The right posterior cerebral artery originates from the basilar tip. The left posterior cerebral artery is of fetal type. PCA branch vessels are normal bilaterally. Venous sinuses: The dural sinuses are patent. The straight sinus and deep cerebral veins are intact. Cortical veins are within normal limits. No significant vascular malformation is evident. Anatomic variants: Fetal type left posterior cerebral artery. Review of the MIP images confirms the above findings IMPRESSION: 1. Occlusion of the right internal carotid artery in its distal segment without reconstitution prior to the skull base. 2. The right ICA is occluded through the ICA  terminus. Right M1 and A1 segments are occluded. 3. Minimal filling of distal right MCA branch vessels with some collaterals. 4. Atherosclerotic changes at the right carotid bifurcation and cavernous left internal carotid artery without significant stenosis. 5. Fetal type left posterior cerebral artery. 6. Probable pulmonary edema. 7.  Emphysema (ICD10-J43.9). Electronically Signed   By: [name] Morelle M.D.   On: 08/28/2022 18:18   CT HEAD CODE STROKE WO CONTRAST  Result Date: 08/28/2022 CLINICAL DATA:  Code stroke. Found nonverbal with right-sided gaze and left-sided motor weakness. EXAM: CT HEAD WITHOUT CONTRAST TECHNIQUE: Contiguous axial images were obtained from the base of the skull through the vertex without intravenous contrast. RADIATION DOSE REDUCTION: This exam was performed according to the departmental dose-optimization program which includes automated exposure control, adjustment of the mA and/or kV according to patient size and/or use of iterative reconstruction technique. COMPARISON:  CT head and CTA head and neck 11/21/2021 FINDINGS: Brain: Periventricular white matter disease is stable. Slight asymmetric hypoattenuation is present in the right lentiform nucleus and insular ribbon. Caudate head and internal capsule are within normal limits. No other cortical lesions are evident. The ventricles are of normal size. No significant extraaxial fluid collection is present. The brainstem and cerebellum are within normal limits. Vascular: Atherosclerotic calcifications are present within the cavernous internal carotid arteries bilaterally. Asymmetric hyperdensity is present in the right M1 segment. Skull: Calvarium is intact. No focal lytic or blastic lesions are present. No significant extracranial soft tissue lesion is present. Sinuses/Orbits: The paranasal sinuses and mastoid air cells are clear. The globes and orbits are within normal limits. ASPECTS [facility] Stroke Program Early CT Score) -  Ganglionic level infarction (caudate, lentiform nuclei, internal capsule, insula, M1-M3 cortex): 5/7 - Supraganglionic infarction (M4-M6  cortex): 3/3 Total score (0-10 with 10 being normal): 8/10 IMPRESSION: 1. Asymmetric hyperdensity in the right M1 segment concerning for acute thrombus. 2. Slight asymmetric hypoattenuation in the right lentiform nucleus and insular ribbon. This could represent early infarct. 3. Aspects is 8/10. 4. Stable mild periventricular white matter disease. This likely reflects the sequela of chronic microvascular ischemia. The above was relayed via text pager to Dr. Lyn Records on 08/28/2022 at 18:06 . Electronically Signed   By: [name] Morelle M.D.   On: 08/28/2022 18:06    Labs:  CBC: Recent Labs    03/16/22 0149 07/05/22 1123 08/28/22 1738 08/28/22 1742 08/28/22 2120  WBC 5.7 4.7 7.5  --  8.2  HGB 10.8* 12.5 13.1 14.6 12.5  HCT 34.3* 38.9 40.2 43.0 39.0  PLT 323 359.0 315  --  293    COAGS: Recent Labs    11/21/21 2101 03/14/22 0836 08/28/22 1738  INR 1.0 1.1 1.0  APTT _0 BMP: Recent Labs    03/15/22 0241 03/16/22 0149 07/05/22 1123 08/28/22 1738 08/28/22 1742 08/28/22 2120  NA 134* 137 133* 132* 135  --   K 4.6 4.1 4.2 4.1 4.7  --   CL 108 107 103 102 105  --   CO2 _1 17*  --   --   GLUCOSE 91 123* 87 132* 133*  --   BUN _2 --   CALCIUM 8.9 8.5* 9.4 9.0  --   --   CREATININE 0.75 0.97 0.71 1.45* 1.50* 1.26*  GFRNONAA >60 >60  --  39*  --  47*    LIVER FUNCTION TESTS: Recent Labs    11/22/21 0415 03/14/22 0931 07/05/22 1123 08/28/22 1738  BILITOT 0.3 0.5 0.4 1.0  AST 13* _3 ALT _4 ALKPHOS 76 79 105 101  PROT 7.0 7.7 7.9 7.9  ALBUMIN 2.6* 2.9* 3.6 3.2*    Assessment and Plan:  [name] is 2 days s/p recanalization of R ICA.  Unfortunately, she is without significant symptom improvement.  R CFA access site unremarkable.  No further role for NIR at this time.  Neuro and  team to manage ongoing care.   Electronically Signed: Pasty Spillers, PA 08/30/2022, 6:02 PM   I spent a total of 15 Minutes at the the patient's bedside AND on the patient's hospital floor or unit, greater than 50% of which was counseling/coordinating care for post cerebral mechanical thrombectomy.

## 2022-08-30 NOTE — Procedures (Signed)
Cortrak  Person Inserting Tube:  Ranell Patrick D, RD Tube Type:  Cortrak - 43 inches Tube Size:  10 Tube Location:  Left nare Secured by: Bridle Technique Used to Measure Tube Placement:  Marking at nare/corner of mouth Cortrak Secured At:  63 cm Procedure Comments:  Cortrak Tube Team Note:  Consult received to place a Cortrak feeding tube.   X-ray is required, abdominal x-ray has been ordered by the Cortrak team. Please confirm tube placement before using the Cortrak tube.   If the tube becomes dislodged please keep the tube and contact the Cortrak team at [email or web address] for replacement.  If after hours and replacement cannot be delayed, place a NG tube and confirm placement with an abdominal x-ray.    Ranell Patrick, RD, LDN Clinical Dietitian RD pager # available in [place]  After hours/weekend pager # available in [facility]

## 2022-08-31 DIAGNOSIS — I63231 Cerebral infarction due to unspecified occlusion or stenosis of right carotid arteries: Secondary | ICD-10-CM | POA: Diagnosis not present

## 2022-08-31 LAB — PHOSPHORUS
Phosphorus: 2.6 mg/dL (ref 2.5–4.6)
Phosphorus: 2.4 mg/dL — ABNORMAL LOW (ref 2.5–4.6)

## 2022-08-31 LAB — MAGNESIUM
Magnesium: 2.2 mg/dL (ref 1.7–2.4)
Magnesium: 2.1 mg/dL (ref 1.7–2.4)

## 2022-08-31 LAB — GLUCOSE, CAPILLARY
Glucose-Capillary: 157 mg/dL — ABNORMAL HIGH (ref 70–99)
Glucose-Capillary: 131 mg/dL — ABNORMAL HIGH (ref 70–99)
Glucose-Capillary: 121 mg/dL — ABNORMAL HIGH (ref 70–99)
Glucose-Capillary: 141 mg/dL — ABNORMAL HIGH (ref 70–99)
Glucose-Capillary: 110 mg/dL — ABNORMAL HIGH (ref 70–99)

## 2022-08-31 MED ORDER — ORAL CARE MOUTH RINSE
15.0000 mL | OROMUCOSAL | Status: DC
  Administered 2022-08-31 – 2022-09-18 (×68): 15 mL via OROMUCOSAL

## 2022-08-31 NOTE — Progress Notes (Addendum)
STROKE TEAM PROGRESS NOTE   INTERVAL HISTORY Patient is seen in her room with no family at the bedside.  Her blood pressure has been stable off Cleviprex, and she is ready to transfer out of the ICU.  She has been hemodynamically stable, and her neurological exam is also stable.  Therapist recommend rehab in skilled nursing facility  Vitals:   08/31/22 1000 08/31/22 1100 08/31/22 1146 08/31/22 1200  BP: (!) 150/79 124/61  124/68  Pulse: 64 (!) 53  (!) 57  Resp: 14 18  19   Temp:   98.8 F (37.1 C)   TempSrc:   Oral   SpO2: 98% 96%  95%  Weight:       CBC:  Recent Labs  Lab 08/28/22 1738 08/28/22 1742 08/28/22 2120  WBC 7.5  --  8.2  NEUTROABS 3.6  --   --   HGB 13.1 14.6 12.5  HCT 40.2 43.0 39.0  MCV 73.6*  --  74.7*  PLT 315  --  893    Basic Metabolic Panel:  Recent Labs  Lab 08/28/22 1738 08/28/22 1742 08/28/22 2120 08/30/22 1556 08/31/22 0446  NA 132* 135  --   --   --   K 4.1 4.7  --   --   --   CL 102 105  --   --   --   CO2 17*  --   --   --   --   GLUCOSE 132* 133*  --   --   --   BUN 16 22  --   --   --   CREATININE 1.45* 1.50* 1.26*  --   --   CALCIUM 9.0  --   --   --   --   MG  --   --   --  2.1 2.2  PHOS  --   --   --  2.7 2.6    Lipid Panel:  Recent Labs  Lab 08/29/22 0515  CHOL 144  TRIG 88  HDL 44  CHOLHDL 3.3  VLDL 18  LDLCALC 82    HgbA1c: No results for input(s): "HGBA1C" in the last 168 hours. Urine Drug Screen: No results for input(s): "LABOPIA", "COCAINSCRNUR", "LABBENZ", "AMPHETMU", "THCU", "LABBARB" in the last 168 hours.  Alcohol Level  Recent Labs  Lab 08/28/22 1738  ETH <10     IMAGING past 24 hours DG Abd Portable 1V  Result Date: 08/30/2022 CLINICAL DATA:  Feeding tube placement. EXAM: PORTABLE ABDOMEN - 1 VIEW COMPARISON:  Abdominopelvic CT 01/28/2019 FINDINGS: Tip of the weighted enteric tube is in the midline in the region of the distal stomach. There is question of excreted IV contrast within a dilated right  renal collecting system. Nonobstructive occluded bowel-gas pattern. IMPRESSION: 1. Tip of the weighted enteric tube in the region of the distal stomach. 2. Question of excreted IV contrast within a dilated right renal collecting system. Consider renal ultrasound to assess for hydronephrosis. Electronically Signed   By: Keith Rake M.D.   On: 08/30/2022 16:12    PHYSICAL EXAM  Physical Exam  Constitutional: Middle-aged African-American female laying in ICU bed. Psych: Affect appropriate to situation.  Cooperative with exam Eyes: No scleral injection HENT: No OP obstrucion MSK: no joint deformities.  Cardiovascular: Normal rate and regular rhythm.  Respiratory: Effort normal, non-labored breathing  Neuro: Mental Status: Patient is awake and alert.  She is largely nonverbal but will speak with dysarthric voice when prompted.  She nods/shakes head "yes" or "  no" appropriately to examiner questions. Patient is able to follow commands without difficulty. Patient is slightly aphasic and severely dysarthric when attempting to verbalize. Cranial Nerves: II: Does not blink to threat on the left pupils are equal, round, and reactive to light.   III,IV, VI: Rightward gaze preference, unable to cross midline VII: Left facial droop noted VIII: Hearing is intact to voice XII: Tongue protrudes midline  Motor: Tone is normal. Bulk is normal.  Normal antigravity strength in right upper extremity.  Left upper extremity only shows trace movement in the fingers and at elbow.  Bilateral AKAs. Right leg elevates antigravity, left leg is with trace movement Sensory: Appears intact to light touch throughout  ASSESSMENT/PLAN [name] is a 67 y.o. female with history of CAD, GERD, CVA in March of 2023, HTN, HLD, PAD with bilateral AKA, AF on Eliquis, AICD, Sjogren's syndrome, and cardiomyopathy presenting with right gaze deviation and left-sided weakness. LKW 08/28/22 15:45.  Stroke: Acute right  hemispheric infarct due to acute right ICA terminus occlusion.  Etiology is likely embolic secondary to atrial fibrillation s/p IR mechanical thrombectomy with direct contact aspiration of right ICA, MCA, and ACA with complete recanalization.  Code Stroke asymmetric hyperdensity in the right M1 segment concerning for acute thrombus.  Slight asymmetric hypoattenuation in the right lentiform nucleus and insular ribbon.  This could represent early infarct. ASPECTS 8/10.  Stable mild periventricular white matter disease.  This likely reflects sequela of chronic microvascular ischemia. CTA head & neck occlusion of the right ICA in its distal segment without reconstitution prior to the skull base.  The right ICA is occluded through the ICA terminus.  Right M1 and A1 segments are occluded.  Minimal filling of distal right MCA branch vessels with some collaterals.  Atherosclerotic changes of the right carotid bifurcation and cavernous left ICA without significant stenosis.  Left posterior cerebral artery. Cerebral angio occlusion of the right ICA terminus identified.  Mechanical thrombectomy performed with direct contact aspiration and combined aspiration and stent retriever in the ICA, MCA, and ACA achieving complete recanalization.  Flat panel CT showed contrast staining in the right basal ganglia. Post IR CT pending MRI unable to obtain due to PPM. 2D Echo EF 55 to [id number], grade 1 diastolic dysfunction, no atrial level shunt, normal left atrial size LDL 82 HgbA1c 6.6 VTE prophylaxis -Lovenox    Diet   Diet NPO time specified   Eliquis (apixaban) daily prior to admission, now on No antithrombotic. Eliquis held in the setting of acute CVA with hemorrhagic transformation.  Therapy recommendations: SNF Disposition: Pending  History of CVA in March 2023 Presenting with speech disturbance and right upper extremity weakness Following her CVA in March, patient had residual dysarthria and improving RUE  weakness It was felt that her stroke was due to PAF with Eliquis nonadherence as patient stopped taking AC as she perceived "it was not doing anything for me" Eliquis restarted during this hospitalization 5 mg twice daily No neurology outpatient follow up available for review on EMAR  Paroxysmal atrial fibrillation Previously noncompliant with CVA in March of 2023 Patient endorses current compliance with Eliquis   Hypertension Home meds: Lopressor Unstable Systolic blood pressure goal less than 160, currently still requiring Cleviprex As needed labetalol and hydralazine Will start oral blood pressure medications when enteral access achieved Long-term BP goal normotensive  Hyperlipidemia CAD, PAD s/p bilateral AKA Home meds: Crestor 40 mg, resumed in hospital LDL 82, goal < 70 Continue statin at discharge  Other Stroke Risk Factors Advanced Age >/= 82  Former cigarette smoker Hx stroke/TIA Coronary artery disease, MI s/p PPM Afib with a history of nonadherence to Eliquis  Other Active Problems PAD s/p bilateral AKA Sjogren syndrome  Hospital day # [place] , MSN, AGACNP-BC [facility] See Amion for schedule and pager information 08/31/2022 1:11 PM  I have personally obtained history,examined this patient, reviewed notes, independently viewed imaging studies, participated in medical decision making and plan of care.ROS completed by me personally and pertinent positives fully documented  I have made any additions or clarifications directly to the above note. Agree with note above.  Patient's neurological exam remains stable with mild aphasia aphasia, dysarthria and dense hemiplegia.  Recommend mobilize out of bed.  Continue ongoing therapies.  Transfer to neurology floor bed.  Repeat CT head tomorrow morning and if hemorrhagic transformation is improved may consider resuming anticoagulation.  Discussed with family at the bedside and answered  questions.This patient is critically ill and at significant risk of neurological worsening, death and care requires constant monitoring of vital signs, hemodynamics,respiratory and cardiac monitoring, extensive review of multiple databases, frequent neurological assessment, discussion with family, other specialists and medical decision making of high complexity.I have made any additions or clarifications directly to the above note.This critical care time does not reflect procedure time, or teaching time or supervisory time of PA/NP/Med Resident etc but could involve care discussion time.  I spent 30 minutes of neurocritical care time  in the care of  this patient.      Antony Contras, MD Medical Director [facility] Pager: [phone] 08/31/2022 2:56 PM  To contact Stroke Continuity provider, please refer to [email or web address] After hours, contact General Neurology

## 2022-08-31 NOTE — Progress Notes (Signed)
Physical Therapy Treatment Patient Details Name: Mercedes [name] MRN: [id number] DOB: 06/12/1956 Today's Date: 08/31/2022   History of Present Illness 67 yo s/p 08/28/22 R ICA occlusion with thrombectomy NIH 19 PMH L frontal CVA B AKA, takotsubo CM, SLE, RA, PAD, MI, HTN, CAD.    PT Comments    Session limited by elevated BP and pt c/o headache. She required mod assist to pull forward to sit with bed in chair position. Able to sustain forward sit a few seconds. Pt participated in ADLs/UE activities with bed in chair position. HOB then returned to 30 degrees for LE exercises/ROM. RN notified of 151/74 BP and headache. Speech is slurred but improving. During session, pt repeatedly asking to take coretrak out. Explained purpose of coretrak, and pt continued to state "take it out."    Recommendations for follow up therapy are one component of a multi-disciplinary discharge planning process, led by the attending physician.  Recommendations may be updated based on patient status, additional functional criteria and insurance authorization.  Follow Up Recommendations  Skilled nursing-short term rehab (<3 hours/day) Can patient physically be transported by private vehicle: No   Assistance Recommended at Discharge Frequent or constant Supervision/Assistance  Patient can return home with the following Assist for transportation;Two people to help with bathing/dressing/bathroom;Two people to help with walking and/or transfers;Help with stairs or ramp for entrance   Equipment Recommendations  Hospital bed;Other (comment) (hoyer lift)    Recommendations for Other Services       Precautions / Restrictions Precautions Precautions: Fall Precaution Comments: SBP 120-140 Restrictions Weight Bearing Restrictions: No Other Position/Activity Restrictions: h/o B AKA     Mobility  Bed Mobility Overal bed mobility: Needs Assistance             General bed mobility comments: mod assist to pull to  sit with bed in chair position. Pt pulling forward with RUE on lower bed rail.    Transfers                        Ambulation/Gait               General Gait Details: nonambulatory at baseline   Stairs             Wheelchair Mobility    Modified Rankin (Stroke Patients Only) Modified Rankin (Stroke Patients Only) Pre-Morbid Rankin Score: Moderately severe disability Modified Rankin: Severe disability     Balance Overall balance assessment: Needs assistance Sitting-balance support: Single extremity supported Sitting balance-Leahy Scale: Poor Sitting balance - Comments: mod assist to maintain upright sit with bed in chair position Postural control: Posterior lean, Left lateral lean                                  Cognition Arousal/Alertness: Awake/alert Behavior During Therapy: Flat affect Overall Cognitive Status: Within Functional Limits for tasks assessed                                 General Comments: Slurred speech but more intelligible. Frustration with having to repeat herself when not understood on first attempt.        Exercises Amputee Exercises Hip ABduction/ADduction: AROM, Right, 10 reps Hip Flexion/Marching: AROM, Right, 10 reps Other Exercises Other Exercises: PROM L residual limb    General Comments General comments (skin integrity, edema, etc.): SpO2  95% on RA. HR 56-65. BP 151/74      Pertinent Vitals/Pain Pain Assessment Pain Assessment: Faces Faces Pain Scale: Hurts even more Pain Location: headache/jaw Pain Descriptors / Indicators: Grimacing, Discomfort Pain Intervention(s): Limited activity within patient's tolerance, Monitored during session, Patient requesting pain meds-RN notified    Home Living                          Prior Function            PT Goals (current goals can now be found in the care plan section) Acute Rehab PT Goals Patient Stated Goal: not  stated Progress towards PT goals: Progressing toward goals    Frequency    Min 3X/week      PT Plan Current plan remains appropriate    Co-evaluation              AM-PAC PT "6 Clicks" Mobility   Outcome Measure  Help needed turning from your back to your side while in a flat bed without using bedrails?: A Lot Help needed moving from lying on your back to sitting on the side of a flat bed without using bedrails?: A Lot Help needed moving to and from a bed to a chair (including a wheelchair)?: Total Help needed standing up from a chair using your arms (e.g., wheelchair or bedside chair)?: Total Help needed to walk in hospital room?: Total Help needed climbing 3-5 steps with a railing? : Total 6 Click Score: 8    End of Session   Activity Tolerance: Patient limited by pain;Treatment limited secondary to medical complications (Comment) (headache, elevated BP) Patient left: in bed;with family/visitor present;with call bell/phone within reach Nurse Communication: Mobility status;Patient requests pain meds PT Visit Diagnosis: Other abnormalities of gait and mobility (R26.89);Hemiplegia and hemiparesis Hemiplegia - Right/Left: Left Hemiplegia - dominant/non-dominant: Non-dominant Hemiplegia - caused by: Cerebral infarction     Time: 0811-0832 PT Time Calculation (min) (ACUTE ONLY): 21 min  Charges:  $Therapeutic Activity: 8-22 mins                     Lorrin Goodell, PT  Office # [phone] Pager [phone]    Lorriane Shire 08/31/2022, 9:09 AM

## 2022-08-31 NOTE — Progress Notes (Signed)
Speech Language Pathology Treatment: Dysphagia;Cognitive-Linquistic  Patient Details Name: Mercedes [name] MRN: [id number] DOB: 03/26/1956 Today's Date: 08/31/2022 Time: 1010-1034 SLP Time Calculation (min) (ACUTE ONLY): 24 min  Assessment / Plan / Recommendation Clinical Impression  Pt is alert and showing mild signs of improvement compared to initial evaluations. She continues to report pain in her mouth/jaw but has increased ROM bilaterally when asked to open her mouth, smile, stick out her tongue. As a result, she has improving clarity of speech and management of saliva. She is more communicative today but still needs Min-Mod cues for intelligibility at the phrase level. Her expressive language seems to be fairly fluent though, and she is oriented x4. She notes that she "can't talk" but does not have as much awareness of other physical changes, including to her swallowing. Despite acknowledging that she is coughing on her saliva and that it is pooling within her oral cavity, she denies any acute changes and states that she can swallow well. Pt also has coughing on single pieces of ice, as well as oral residue that was cleared with SLP assist for use of yankauer. Pt will benefit from MBS at some point, but would like to see if she makes some additional progress, particularly with secretion management, before proceeding with scheduling this. Would remain NPO for now with staff to provide assistance cueing to cough, swallow, and use yankauer PRN.     HPI HPI: 67 yo presenting 08/28/22 with L sided weakness, not speaking, R gaze preference. 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 Pt was evaluated by SLP in March 2023 and found to have mild-moderate dysarthria; cognitive-linguistic function and swallowing WFL. PMH includes: L frontal CVA, GERD, sjogren's syndrome, B AKA, takotsubo CM, SLE, RA, PAD, MI, HTN, CAD, anxiety, depression, chiari malformation      SLP Plan  Continue with current plan  of care      Recommendations for follow up therapy are one component of a multi-disciplinary discharge planning process, led by the attending physician.  Recommendations may be updated based on patient status, additional functional criteria and insurance authorization.    Recommendations  Diet recommendations: NPO Medication Administration: Via alternative means                Oral Care Recommendations: Oral care QID Follow Up Recommendations: Skilled nursing-short term rehab (<3 hours/day) Assistance recommended at discharge: Frequent or constant Supervision/Assistance SLP Visit Diagnosis: Dysarthria and anarthria (R47.1);Dysphagia, unspecified (R13.10) Plan: Continue with current plan of care           Osie Bond., M.A. [place] Office [phone]  Secure chat preferred   08/31/2022, 10:37 AM

## 2022-09-01 DIAGNOSIS — I63231 Cerebral infarction due to unspecified occlusion or stenosis of right carotid arteries: Secondary | ICD-10-CM | POA: Diagnosis not present

## 2022-09-01 LAB — CBC
HCT: 36.8 % (ref 36.0–46.0)
Hemoglobin: 11.9 g/dL — ABNORMAL LOW (ref 12.0–15.0)
MCH: 23.9 pg — ABNORMAL LOW (ref 26.0–34.0)
MCHC: 32.3 g/dL (ref 30.0–36.0)
MCV: 74 fL — ABNORMAL LOW (ref 80.0–100.0)
Platelets: 365 10*3/uL (ref 150–400)
RBC: 4.97 MIL/uL (ref 3.87–5.11)
RDW: 18.1 % — ABNORMAL HIGH (ref 11.5–15.5)
WBC: 6.9 10*3/uL (ref 4.0–10.5)
nRBC: 0 % (ref 0.0–0.2)

## 2022-09-01 LAB — MAGNESIUM: Magnesium: 2.2 mg/dL (ref 1.7–2.4)

## 2022-09-01 LAB — GLUCOSE, CAPILLARY
Glucose-Capillary: 146 mg/dL — ABNORMAL HIGH (ref 70–99)
Glucose-Capillary: 131 mg/dL — ABNORMAL HIGH (ref 70–99)
Glucose-Capillary: 154 mg/dL — ABNORMAL HIGH (ref 70–99)
Glucose-Capillary: 122 mg/dL — ABNORMAL HIGH (ref 70–99)
Glucose-Capillary: 120 mg/dL — ABNORMAL HIGH (ref 70–99)

## 2022-09-01 LAB — BASIC METABOLIC PANEL
Anion gap: 7 (ref 5–15)
BUN: 30 mg/dL — ABNORMAL HIGH (ref 8–23)
CO2: 20 mmol/L — ABNORMAL LOW (ref 22–32)
Calcium: 8.9 mg/dL (ref 8.9–10.3)
Chloride: 111 mmol/L (ref 98–111)
Creatinine, Ser: 0.87 mg/dL (ref 0.44–1.00)
GFR, Estimated: >60 mL/min (ref >60)
Glucose, Bld: 118 mg/dL — ABNORMAL HIGH (ref 70–99)
Potassium: 4 mmol/L (ref 3.5–5.1)
Sodium: 138 mmol/L (ref 135–145)

## 2022-09-01 LAB — PHOSPHORUS: Phosphorus: 3 mg/dL (ref 2.5–4.6)

## 2022-09-01 MED ORDER — ROSUVASTATIN CALCIUM 20 MG PO TABS
40.0000 mg | ORAL_TABLET | Freq: Every day | ORAL | Status: DC
  Administered 2022-09-01 – 2022-09-13 (×13): 40 mg
  Filled 2022-09-01 (×13): qty 2

## 2022-09-01 MED ORDER — ACETAMINOPHEN 325 MG PO TABS
650.0000 mg | ORAL_TABLET | Freq: Four times a day (QID) | ORAL | Status: AC | PRN
  Administered 2022-09-03: 650 mg via ORAL

## 2022-09-01 NOTE — Care Management Important Message (Signed)
Important Message  Patient Details  Name: Mercedes [name] MRN: [id number] Date of Birth: [birth year]   Medicare Important Message Given:  Yes     Hannah Beat 09/01/2022, 12:49 PM

## 2022-09-01 NOTE — Progress Notes (Signed)
STROKE TEAM PROGRESS NOTE   INTERVAL HISTORY Patient is seen in her room with no family at the bedside.  She has transfered out of the ICU.  She has been hemodynamically stable, and her neurological exam is also stable.  Therapist recommend rehab in skilled nursing facility  Vitals:   08/31/22 2315 09/01/22 0315 09/01/22 0815 09/01/22 1145  BP:   135/73 (!) 151/85  Pulse:   63 81  Resp:   18 18  Temp: 97.8 F (36.6 C) 98 F (36.7 C) 97.7 F (36.5 C) 98.8 F (37.1 C)  TempSrc: Axillary Oral Oral Oral  SpO2:   96% 97%  Weight:       CBC:  Recent Labs  Lab 08/28/22 1738 08/28/22 1742 08/28/22 2120 09/01/22 0550  WBC 7.5  --  8.2 6.9  NEUTROABS 3.6  --   --   --   HGB 13.1   < > 12.5 11.9*  HCT 40.2   < > 39.0 36.8  MCV 73.6*  --  74.7* 74.0*  PLT 315  --  293 365   < > = values in this interval not displayed.   Basic Metabolic Panel:  Recent Labs  Lab 08/28/22 1738 08/28/22 1742 08/28/22 2120 08/30/22 1556 08/31/22 1703 09/01/22 0550  NA 132* 135  --   --   --  138  K 4.1 4.7  --   --   --  4.0  CL 102 105  --   --   --  111  CO2 17*  --   --   --   --  20*  GLUCOSE 132* 133*  --   --   --  118*  BUN 16 22  --   --   --  30*  CREATININE 1.45* 1.50* 1.26*  --   --  0.87  CALCIUM 9.0  --   --   --   --  8.9  MG  --   --   --    < > 2.1 2.2  PHOS  --   --   --    < > 2.4* 3.0   < > = values in this interval not displayed.   Lipid Panel:  Recent Labs  Lab 08/29/22 0515  CHOL 144  TRIG 88  HDL 44  CHOLHDL 3.3  VLDL 18  LDLCALC 82   HgbA1c: No results for input(s): "HGBA1C" in the last 168 hours. Urine Drug Screen: No results for input(s): "LABOPIA", "COCAINSCRNUR", "LABBENZ", "AMPHETMU", "THCU", "LABBARB" in the last 168 hours.  Alcohol Level  Recent Labs  Lab 08/28/22 1738  ETH <10    IMAGING past 24 hours No results found.  PHYSICAL EXAM  Physical Exam  Constitutional: Middle-aged African-American female laying in ICU bed. Psych: Affect  appropriate to situation.  Cooperative with exam Eyes: No scleral injection HENT: No OP obstrucion MSK: no joint deformities.  Cardiovascular: Normal rate and regular rhythm.  Respiratory: Effort normal, non-labored breathing  Neuro: Mental Status: Patient is awake and alert.  She is largely nonverbal but will speak with dysarthric voice when prompted.  She nods/shakes head "yes" or "no" appropriately to examiner questions. Patient is able to follow commands without difficulty. Patient is slightly aphasic and severely dysarthric when attempting to verbalize. Cranial Nerves: II: Does not blink to threat on the left pupils are equal, round, and reactive to light.   III,IV, VI: Rightward gaze preference, unable to cross midline VII: Left facial droop noted VIII: Hearing  is intact to voice XII: Tongue protrudes midline  Motor: Tone is normal. Bulk is normal.  Normal antigravity strength in right upper extremity.  Left upper extremity only shows trace movement in the fingers and at elbow.  Bilateral AKAs. Right leg elevates antigravity, left leg is with trace movement Sensory: Appears intact to light touch throughout  ASSESSMENT/PLAN Ms. TOMMA EHINGER is a 67 y.o. female with history of CAD, GERD, CVA in March of 2023, HTN, HLD, PAD with bilateral AKA, AF on Eliquis, AICD, Sjogren's syndrome, and cardiomyopathy presenting with right gaze deviation and left-sided weakness. LKW 08/28/22 15:45.  Stroke: Acute right hemispheric infarct due to acute right ICA terminus occlusion.  Etiology is likely embolic secondary to atrial fibrillation s/p IR mechanical thrombectomy with direct contact aspiration of right ICA, MCA, and ACA with complete recanalization.  Code Stroke asymmetric hyperdensity in the right M1 segment concerning for acute thrombus.  Slight asymmetric hypoattenuation in the right lentiform nucleus and insular ribbon.  This could represent early infarct. ASPECTS 8/10.  Stable mild  periventricular white matter disease.  This likely reflects sequela of chronic microvascular ischemia. CTA head & neck occlusion of the right ICA in its distal segment without reconstitution prior to the skull base.  The right ICA is occluded through the ICA terminus.  Right M1 and A1 segments are occluded.  Minimal filling of distal right MCA branch vessels with some collaterals.  Atherosclerotic changes of the right carotid bifurcation and cavernous left ICA without significant stenosis.  Left posterior cerebral artery. Cerebral angio occlusion of the right ICA terminus identified.  Mechanical thrombectomy performed with direct contact aspiration and combined aspiration and stent retriever in the ICA, MCA, and ACA achieving complete recanalization.  Flat panel CT showed contrast staining in the right basal ganglia. Post IR CT pending MRI unable to obtain due to PPM. 2D Echo EF 55 to 57%, grade 1 diastolic dysfunction, no atrial level shunt, normal left atrial size LDL 82 HgbA1c 6.6 VTE prophylaxis -Lovenox    Diet   Diet NPO time specified   Eliquis (apixaban) daily prior to admission, now on No antithrombotic. Eliquis held in the setting of acute CVA with hemorrhagic transformation.  Therapy recommendations: SNF Disposition: Pending  History of CVA in March 2023 Presenting with speech disturbance and right upper extremity weakness Following her CVA in March, patient had residual dysarthria and improving RUE weakness It was felt that her stroke was due to PAF with Eliquis nonadherence as patient stopped taking AC as she perceived "it was not doing anything for me" Eliquis restarted during this hospitalization 5 mg twice daily No neurology outpatient follow up available for review on EMAR  Paroxysmal atrial fibrillation Previously noncompliant with CVA in March of 2023 Patient endorses current compliance with Eliquis   Hypertension Home meds: Lopressor Unstable Systolic blood pressure  goal less than 160, currently still requiring Cleviprex As needed labetalol and hydralazine Will start oral blood pressure medications when enteral access achieved Long-term BP goal normotensive  Hyperlipidemia CAD, PAD s/p bilateral AKA Home meds: Crestor 40 mg, resumed in hospital LDL 82, goal < 70 Continue statin at discharge  Other Stroke Risk Factors Advanced Age >/= 4  Former cigarette smoker Hx stroke/TIA Coronary artery disease, MI s/p PPM Afib with a history of nonadherence to Eliquis  Other Active Problems PAD s/p bilateral AKA Sjogren syndrome  Hospital day # 4  Patient symptomatically stable.  She is complaining of some pain in the right groin.  Will  treat with Tylenol and follow and see how she does.  Continue ongoing therapies.  She continues to have dysphagia and has tube feeds.  Repeat swallow eval next week and if unable to swallow may need to consider PEG tube.  Greater than 50% time during this 35-minute visit was spent in counseling and coordination of care and discussion with patient and care team and answering questions.      Antony Contras, MD Medical Director [facility] Pager: [phone] 09/01/2022 3:30 PM  To contact Stroke Continuity provider, please refer to [email or web address] After hours, contact General Neurology

## 2022-09-02 ENCOUNTER — Inpatient Hospital Stay (HOSPITAL_COMMUNITY): Payer: Medicare HMO

## 2022-09-02 DIAGNOSIS — I480 Paroxysmal atrial fibrillation: Secondary | ICD-10-CM | POA: Diagnosis not present

## 2022-09-02 DIAGNOSIS — I739 Peripheral vascular disease, unspecified: Secondary | ICD-10-CM

## 2022-09-02 DIAGNOSIS — I63231 Cerebral infarction due to unspecified occlusion or stenosis of right carotid arteries: Secondary | ICD-10-CM | POA: Diagnosis not present

## 2022-09-02 DIAGNOSIS — Z8673 Personal history of transient ischemic attack (TIA), and cerebral infarction without residual deficits: Secondary | ICD-10-CM | POA: Diagnosis not present

## 2022-09-02 LAB — BASIC METABOLIC PANEL
Anion gap: 8 (ref 5–15)
BUN: 37 mg/dL — ABNORMAL HIGH (ref 8–23)
CO2: 20 mmol/L — ABNORMAL LOW (ref 22–32)
Calcium: 8.9 mg/dL (ref 8.9–10.3)
Chloride: 110 mmol/L (ref 98–111)
Creatinine, Ser: 0.82 mg/dL (ref 0.44–1.00)
GFR, Estimated: >60 mL/min (ref >60)
Glucose, Bld: 101 mg/dL — ABNORMAL HIGH (ref 70–99)
Potassium: 4.4 mmol/L (ref 3.5–5.1)
Sodium: 138 mmol/L (ref 135–145)

## 2022-09-02 LAB — CBC
HCT: 37 % (ref 36.0–46.0)
Hemoglobin: 11.9 g/dL — ABNORMAL LOW (ref 12.0–15.0)
MCH: 23.9 pg — ABNORMAL LOW (ref 26.0–34.0)
MCHC: 32.2 g/dL (ref 30.0–36.0)
MCV: 74.4 fL — ABNORMAL LOW (ref 80.0–100.0)
Platelets: 342 10*3/uL (ref 150–400)
RBC: 4.97 MIL/uL (ref 3.87–5.11)
RDW: 18.5 % — ABNORMAL HIGH (ref 11.5–15.5)
WBC: 7 10*3/uL (ref 4.0–10.5)
nRBC: 0 % (ref 0.0–0.2)

## 2022-09-02 LAB — GLUCOSE, CAPILLARY
Glucose-Capillary: 114 mg/dL — ABNORMAL HIGH (ref 70–99)
Glucose-Capillary: 119 mg/dL — ABNORMAL HIGH (ref 70–99)
Glucose-Capillary: 135 mg/dL — ABNORMAL HIGH (ref 70–99)
Glucose-Capillary: 122 mg/dL — ABNORMAL HIGH (ref 70–99)
Glucose-Capillary: 124 mg/dL — ABNORMAL HIGH (ref 70–99)
Glucose-Capillary: 131 mg/dL — ABNORMAL HIGH (ref 70–99)

## 2022-09-02 MED ORDER — ASPIRIN 81 MG PO CHEW
81.0000 mg | CHEWABLE_TABLET | Freq: Every day | ORAL | Status: DC
  Administered 2022-09-02 – 2022-09-07 (×6): 81 mg
  Filled 2022-09-02 (×6): qty 1

## 2022-09-02 MED ORDER — LIDOCAINE 5 % EX PTCH
1.0000 | MEDICATED_PATCH | CUTANEOUS | Status: DC
  Administered 2022-09-02 – 2022-09-18 (×12): 1 via TRANSDERMAL
  Filled 2022-09-02 (×14): qty 1

## 2022-09-02 MED ORDER — GABAPENTIN 250 MG/5ML PO SOLN
100.0000 mg | Freq: Three times a day (TID) | ORAL | Status: DC
  Administered 2022-09-02 – 2022-09-04 (×7): 100 mg
  Filled 2022-09-02 (×8): qty 2

## 2022-09-02 MED ORDER — OXYCODONE HCL 5 MG PO TABS
5.0000 mg | ORAL_TABLET | Freq: Four times a day (QID) | ORAL | Status: DC | PRN
  Administered 2022-09-03: 5 mg via ORAL
  Filled 2022-09-02: qty 1

## 2022-09-02 NOTE — Plan of Care (Signed)
  Problem: Education: Goal: Knowledge of disease or condition will improve Outcome: Progressing Goal: Knowledge of secondary prevention will improve (MUST DOCUMENT ALL) Outcome: Progressing Goal: Knowledge of patient specific risk factors will improve [place]ta [place] N/A or DELETE if not current risk factor) Outcome: Progressing   Problem: Ischemic Stroke/TIA Tissue Perfusion: Goal: Complications of ischemic stroke/TIA will be minimized Outcome: Progressing   Problem: Coping: Goal: Will verbalize positive feelings about self Outcome: Progressing Goal: Will identify appropriate support needs Outcome: Progressing   Problem: Health Behavior/Discharge Planning: Goal: Ability to manage health-related needs will improve Outcome: Progressing Goal: Goals will be collaboratively established with patient/family Outcome: Progressing   Problem: Activity: Goal: Ability to return to baseline activity level will improve Outcome: Progressing   Problem: Cardiovascular: Goal: Ability to achieve and maintain adequate cardiovascular perfusion will improve Outcome: Progressing Goal: Vascular access site(s) Level 0-1 will be maintained Outcome: Progressing

## 2022-09-02 NOTE — Plan of Care (Signed)
  Problem: Education: Goal: Knowledge of disease or condition will improve Outcome: Progressing Goal: Knowledge of secondary prevention will improve (MUST DOCUMENT ALL) Outcome: Progressing Goal: Knowledge of patient specific risk factors will improve (Mark N/A or DELETE if not current risk factor) Outcome: Progressing   Problem: Ischemic Stroke/TIA Tissue Perfusion: Goal: Complications of ischemic stroke/TIA will be minimized Outcome: Progressing   Problem: Coping: Goal: Will verbalize positive feelings about self Outcome: Progressing Goal: Will identify appropriate support needs Outcome: Progressing   Problem: Health Behavior/Discharge Planning: Goal: Ability to manage health-related needs will improve Outcome: Progressing Goal: Goals will be collaboratively established with patient/family Outcome: Progressing   Problem: Self-Care: Goal: Ability to participate in self-care as condition permits will improve Outcome: Progressing Goal: Verbalization of feelings and concerns over difficulty with self-care will improve Outcome: Progressing Goal: Ability to communicate needs accurately will improve Outcome: Progressing   Problem: Nutrition: Goal: Risk of aspiration will decrease Outcome: Progressing Goal: Dietary intake will improve Outcome: Progressing   Problem: Education: Goal: Understanding of CV disease, CV risk reduction, and recovery process will improve Outcome: Progressing Goal: Individualized Educational Video(s) Outcome: Progressing   Problem: Activity: Goal: Ability to return to baseline activity level will improve Outcome: Progressing   Problem: Cardiovascular: Goal: Ability to achieve and maintain adequate cardiovascular perfusion will improve Outcome: Progressing Goal: Vascular access site(s) Level 0-1 will be maintained Outcome: Progressing   Problem: Health Behavior/Discharge Planning: Goal: Ability to safely manage health-related needs after  discharge will improve Outcome: Progressing   Problem: Education: Goal: Knowledge of General Education information will improve Description: Including pain rating scale, medication(s)/side effects and non-pharmacologic comfort measures Outcome: Progressing   Problem: Health Behavior/Discharge Planning: Goal: Ability to manage health-related needs will improve Outcome: Progressing   Problem: Clinical Measurements: Goal: Ability to maintain clinical measurements within normal limits will improve Outcome: Progressing Goal: Will remain free from infection Outcome: Progressing Goal: Diagnostic test results will improve Outcome: Progressing Goal: Respiratory complications will improve Outcome: Progressing Goal: Cardiovascular complication will be avoided Outcome: Progressing   Problem: Activity: Goal: Risk for activity intolerance will decrease Outcome: Progressing   Problem: Nutrition: Goal: Adequate nutrition will be maintained Outcome: Progressing   Problem: Coping: Goal: Level of anxiety will decrease Outcome: Progressing   Problem: Elimination: Goal: Will not experience complications related to bowel motility Outcome: Progressing Goal: Will not experience complications related to urinary retention Outcome: Progressing   Problem: Pain Managment: Goal: General experience of comfort will improve Outcome: Progressing   Problem: Safety: Goal: Ability to remain free from injury will improve Outcome: Progressing   Problem: Skin Integrity: Goal: Risk for impaired skin integrity will decrease Outcome: Progressing   

## 2022-09-02 NOTE — Progress Notes (Signed)
STROKE TEAM PROGRESS NOTE   INTERVAL HISTORY Patient is seen in her room with no family at the bedside.  She is awake alert and orientated. Still has left hemiplegia. On TF, pending speech re-evaluation. CT stable with decreased SAH, will start baby ASA.   Vitals:   09/02/22 0000 09/02/22 0340 09/02/22 0400 09/02/22 0722  BP:  (!) 158/72  (!) 155/81  Pulse: 70 72 60 (!) 55  Resp: 20 17 15 17   Temp:  98.9 F (37.2 C)  98 F (36.7 C)  TempSrc:  Oral  Axillary  SpO2: 98% 97% 96% 96%  Weight:       CBC:  Recent Labs  Lab 08/28/22 1738 08/28/22 1742 09/01/22 0550 09/02/22 0424  WBC 7.5   < > 6.9 7.0  NEUTROABS 3.6  --   --   --   HGB 13.1   < > 11.9* 11.9*  HCT 40.2   < > 36.8 37.0  MCV 73.6*   < > 74.0* 74.4*  PLT 315   < > 365 342   < > = values in this interval not displayed.   Basic Metabolic Panel:  Recent Labs  Lab 08/31/22 1703 09/01/22 0550 09/02/22 0424  NA  --  138 138  K  --  4.0 4.4  CL  --  111 110  CO2  --  20* 20*  GLUCOSE  --  118* 101*  BUN  --  30* 37*  CREATININE  --  0.87 0.82  CALCIUM  --  8.9 8.9  MG 2.1 2.2  --   PHOS 2.4* 3.0  --    Lipid Panel:  Recent Labs  Lab 08/29/22 0515  CHOL 144  TRIG 88  HDL 44  CHOLHDL 3.3  VLDL 18  LDLCALC 82   HgbA1c: No results for input(s): "HGBA1C" in the last 168 hours. Urine Drug Screen: No results for input(s): "LABOPIA", "COCAINSCRNUR", "LABBENZ", "AMPHETMU", "THCU", "LABBARB" in the last 168 hours.  Alcohol Level  Recent Labs  Lab 08/28/22 1738  ETH <10    IMAGING past 24 hours CT HEAD WO CONTRAST (10/27/22)  Result Date: 09/02/2022 CLINICAL DATA:  Stroke workup EXAM: CT HEAD WITHOUT CONTRAST TECHNIQUE: Contiguous axial images were obtained from the base of the skull through the vertex without intravenous contrast. RADIATION DOSE REDUCTION: This exam was performed according to the departmental dose-optimization program which includes automated exposure control, adjustment of the mA and/or kV  according to patient size and/or use of iterative reconstruction technique. COMPARISON:  Head CT from 4 days ago FINDINGS: Brain: Decrease in subarachnoid hemorrhage at the inter hemispheric fissure along the ACA vessels. Infarct with hemosiderin staining at the right basal ganglia; superimposed contrast staining on prior is resolved. No evidence of new infarct. Chronic white matter infarct in the left corona radiata. Low-density across the pons only in an area fact by streak artifact. Vascular: No acute finding Skull: Negative Sinuses/Orbits: Negative IMPRESSION: 1. Decreased interhemispheric subarachnoid hemorrhage. 2. Right basal ganglia infarct with petechial hemorrhage that is non progressed. 3. No new abnormality. Electronically Signed   By: 11/01/2022 M.D.   On: 09/02/2022 06:29    PHYSICAL EXAM  Physical Exam  Constitutional: Middle-aged African-American female laying in ICU bed. Psych: Affect appropriate to situation.  Cooperative with exam Eyes: No scleral injection HENT: No OP obstrucion MSK: no joint deformities.  Cardiovascular: Normal rate and regular rhythm.  Respiratory: Effort normal, non-labored breathing  Neuro: awake, alert, eyes open, orientated to age,  place, time. No aphasia, paucity of speech with moderate dysarthria, following all simple commands. Able to name and repeat. No gaze palsy but still right gaze preference, tracking bilaterally, visual field full. Left mild facial droop. Tongue midline. RUE 5/5, LUE flaccid. B/l LE AKA, right LE 5/5, LLE 0/5. Sensation decreased on the left, right FTN intact, gait not tested.    ASSESSMENT/PLAN Ms. NICOLE DEFINO is a 67 y.o. female with history of CAD, GERD, CVA in March of 2023, HTN, HLD, PAD with bilateral AKA, AF on Eliquis, AICD, Sjogren's syndrome, and cardiomyopathy presenting with right gaze deviation and left-sided weakness. LKW 08/28/22 15:45.  Stroke: Acute right hemispheric infarct due to acute right ICA  terminus occlusion.  Etiology is likely embolic secondary to atrial fibrillation s/p IR mechanical thrombectomy with direct contact aspiration of right ICA, MCA, and ACA with complete recanalization.  Code Stroke right M1 segment concerning for acute thrombus.  Slight asymmetric hypoattenuation in the right lentiform nucleus and insular ribbon.  This could represent early infarct. ASPECTS 8/10.   CTA head & neck occlusion of the right ICA in its distal segment without reconstitution prior to the skull base.  The right ICA is occluded through the ICA terminus.  Right M1 and A1 segments are occluded.  Minimal filling of distal right MCA branch vessels with some collaterals.   IR with ICA, MCA, and ACA achieving complete recanalization.   CT 1/2 right MCA infarct CT 1/5 decreased interhemispheric SAH, right BG infarct with petechial hemorrhage, stable. MRI unable to obtain due to PPM. 2D Echo EF 55 to 60% LDL 82 HgbA1c 6.6 VTE prophylaxis -Lovenox Eliquis (apixaban) daily prior to admission, now on aspirin 81.  Therapy recommendations: SNF Disposition: Pending  History of CVA and seizure 10/2014 admitted for focal seizure.  Patient not on AED. 10/2021 presenting with speech disturbance and right upper extremity weakness.  CT showed left large subcortical infarct.  CTA head and neck left ICA 60% stenosis.  Carotid Doppler left ICA 40 to 59% stenosis.  EF 55 to 60%.  LDL 86, A1c 6.2. It was felt that her stroke was due to PAF with Eliquis nonadherence as patient stopped taking AC as she perceived "it was not doing anything for me". Discharged on Eliquis and Crestor 40. Patient had residual dysarthria and improving RUE weakness  Paroxysmal atrial fibrillation Previously noncompliant with CVA in 10/2021 Patient endorses current compliance with Eliquis  Hold all Eliquis for now given [facility] On aspirin 81 now  Hypertension Home meds: Lopressor Stable  On Norvasc 10  long-term BP goal  normotensive  Hyperlipidemia Home meds: Crestor 40 mg, resumed in hospital LDL 82, goal < 70 Continue statin at discharge  PAD status post bilateral AKA Right leg pain Right leg warm, no sign of ischemic limb  pain likely due to PAD Put on gabapentin 100 tid Tylenol and oxycodone as needed  Other Stroke Risk Factors Advanced Age >/= 31  Former cigarette smoker Coronary artery disease  Other Active Problems SSS status post pacemaker Sjogren syndrome  Hospital day # 5  Rosalin Hawking, MD PhD Stroke Neurology 09/02/2022 6:40 PM   To contact Stroke Continuity provider, please refer to [email or web address] After hours, contact General Neurology

## 2022-09-03 DIAGNOSIS — Z89611 Acquired absence of right leg above knee: Secondary | ICD-10-CM | POA: Diagnosis not present

## 2022-09-03 DIAGNOSIS — I739 Peripheral vascular disease, unspecified: Secondary | ICD-10-CM | POA: Diagnosis not present

## 2022-09-03 DIAGNOSIS — Z89612 Acquired absence of left leg above knee: Secondary | ICD-10-CM

## 2022-09-03 DIAGNOSIS — I480 Paroxysmal atrial fibrillation: Secondary | ICD-10-CM | POA: Diagnosis not present

## 2022-09-03 DIAGNOSIS — I63231 Cerebral infarction due to unspecified occlusion or stenosis of right carotid arteries: Secondary | ICD-10-CM | POA: Diagnosis not present

## 2022-09-03 LAB — BASIC METABOLIC PANEL
Anion gap: 11 (ref 5–15)
BUN: 38 mg/dL — ABNORMAL HIGH (ref 8–23)
CO2: 21 mmol/L — ABNORMAL LOW (ref 22–32)
Calcium: 9 mg/dL (ref 8.9–10.3)
Chloride: 106 mmol/L (ref 98–111)
Creatinine, Ser: 0.74 mg/dL (ref 0.44–1.00)
GFR, Estimated: >60 mL/min (ref >60)
Glucose, Bld: 127 mg/dL — ABNORMAL HIGH (ref 70–99)
Potassium: 4.5 mmol/L (ref 3.5–5.1)
Sodium: 138 mmol/L (ref 135–145)

## 2022-09-03 LAB — CBC
HCT: 35.5 % — ABNORMAL LOW (ref 36.0–46.0)
Hemoglobin: 11.4 g/dL — ABNORMAL LOW (ref 12.0–15.0)
MCH: 23.7 pg — ABNORMAL LOW (ref 26.0–34.0)
MCHC: 32.1 g/dL (ref 30.0–36.0)
MCV: 73.7 fL — ABNORMAL LOW (ref 80.0–100.0)
Platelets: 392 10*3/uL (ref 150–400)
RBC: 4.82 MIL/uL (ref 3.87–5.11)
RDW: 18.2 % — ABNORMAL HIGH (ref 11.5–15.5)
WBC: 6.9 10*3/uL (ref 4.0–10.5)
nRBC: 0 % (ref 0.0–0.2)

## 2022-09-03 LAB — GLUCOSE, CAPILLARY
Glucose-Capillary: 143 mg/dL — ABNORMAL HIGH (ref 70–99)
Glucose-Capillary: 122 mg/dL — ABNORMAL HIGH (ref 70–99)
Glucose-Capillary: 99 mg/dL (ref 70–99)
Glucose-Capillary: 123 mg/dL — ABNORMAL HIGH (ref 70–99)
Glucose-Capillary: 124 mg/dL — ABNORMAL HIGH (ref 70–99)
Glucose-Capillary: 134 mg/dL — ABNORMAL HIGH (ref 70–99)

## 2022-09-03 MED ORDER — OXYCODONE HCL 5 MG PO TABS
5.0000 mg | ORAL_TABLET | Freq: Four times a day (QID) | ORAL | Status: DC | PRN
  Administered 2022-09-04 – 2022-09-13 (×14): 5 mg
  Filled 2022-09-03 (×14): qty 1

## 2022-09-03 NOTE — Progress Notes (Addendum)
STROKE TEAM PROGRESS NOTE   INTERVAL HISTORY Patient seen lying in her bed.  No family at bedside.  She is awake and alert.  She is complaining of bilateral lower extremity pain from laying around in the bed.  Typically she utilizes her prosthesis at home to walk around.  Gabapentin was added yesterday but she has not felt this was helpful.  She continues with tube feeds.  Awaiting speech and physical therapy eval/reeval.  She would like to have a Coke.  She also is requesting something for pain for her legs.  Vitals:   09/02/22 1940 09/02/22 2325 09/03/22 0357 09/03/22 0716  BP: (!) 140/84 (!) 149/91 137/78 135/69  Pulse: (!) 103 74 79 75  Resp: 20 18 20 17   Temp: 98.8 F (37.1 C) 98.6 F (37 C) (!) 97.4 F (36.3 C) 98.8 F (37.1 C)  TempSrc: Oral Oral Axillary Oral  SpO2: 97% 96% 95% 98%  Weight:       CBC:  Recent Labs  Lab 08/28/22 1738 08/28/22 1742 09/02/22 0424 09/03/22 0358  WBC 7.5   < > 7.0 6.9  NEUTROABS 3.6  --   --   --   HGB 13.1   < > 11.9* 11.4*  HCT 40.2   < > 37.0 35.5*  MCV 73.6*   < > 74.4* 73.7*  PLT 315   < > 342 392   < > = values in this interval not displayed.   Basic Metabolic Panel:  Recent Labs  Lab 08/31/22 1703 09/01/22 0550 09/02/22 0424 09/03/22 0358  NA  --  138 138 138  K  --  4.0 4.4 4.5  CL  --  111 110 106  CO2  --  20* 20* 21*  GLUCOSE  --  118* 101* 127*  BUN  --  30* 37* 38*  CREATININE  --  0.87 0.82 0.74  CALCIUM  --  8.9 8.9 9.0  MG 2.1 2.2  --   --   PHOS 2.4* 3.0  --   --    Lipid Panel:  Recent Labs  Lab 08/29/22 0515  CHOL 144  TRIG 88  HDL 44  CHOLHDL 3.3  VLDL 18  LDLCALC 82   HgbA1c: No results for input(s): "HGBA1C" in the last 168 hours. Urine Drug Screen: No results for input(s): "LABOPIA", "COCAINSCRNUR", "LABBENZ", "AMPHETMU", "THCU", "LABBARB" in the last 168 hours.  Alcohol Level  Recent Labs  Lab 08/28/22 1738  ETH <10    IMAGING past 24 hours No results found.  Temp:  [97.4 F (36.3  C)-98.8 F (37.1 C)] 98.8 F (37.1 C) (01/07 0716) Pulse Rate:  [63-103] 75 (01/07 0716) Resp:  [17-20] 17 (01/07 0716) BP: (135-149)/(66-91) 135/69 (01/07 0716) SpO2:  [95 %-98 %] 98 % (01/07 0716)  General - Well nourished, well developed, in no apparent distress. HEENT- atraumatic, normocephalic  Cardiovascular - Regular rhythm and rate. Pulmonary- equal chest rise; unlabored respirations.  Extremities- BLE AKA  Neuro: Mental Status -  Alert and awake.  Orientation to time, place and person intact.   Language intact. Speech mild to moderate dysarthria. Attention span and concentration were normal.  Right upper and right lower extremities are normal strength and movement-5/5.  Left side is flaccid. She has left facial droop.  Sensation is decreased on the left. Visual fields are intact. Her extraocular movements are intact. Right gaze preference but crosses midline. She follows commands. Gait is not observed as noted above she has a bilateral  AKA.   ASSESSMENT/PLAN Mercedes [name] is a 67 y.o. female with history of CAD, GERD, CVA in March of 2023, HTN, HLD, PAD with bilateral AKA, AF on Eliquis, AICD, Sjogren's syndrome, and cardiomyopathy presenting with right gaze deviation and left-sided weakness. LKW 08/28/22 15:45.  Stroke: Acute right hemispheric infarct due to acute right ICA terminus occlusion.  Etiology is likely embolic secondary to atrial fibrillation s/p IR mechanical thrombectomy with direct contact aspiration of right ICA, MCA, and ACA with complete recanalization.  Code Stroke right M1 segment concerning for acute thrombus.  Slight asymmetric hypoattenuation in the right lentiform nucleus and insular ribbon.  This could represent early infarct. ASPECTS 8/10.   CTA head & neck occlusion of the right ICA in its distal segment without reconstitution prior to the skull base.  The right ICA is occluded through the ICA terminus.  Right M1 and A1 segments are  occluded.  Minimal filling of distal right MCA branch vessels with some collaterals.   IR with ICA, MCA, and ACA achieving complete recanalization.   CT 1/2 right MCA infarct CT 1/5 decreased interhemispheric SAH, right BG infarct with petechial hemorrhage, stable. MRI unable to obtain due to PPM. 2D Echo EF 55 to 60% LDL 82 HgbA1c 6.6 VTE prophylaxis -Lovenox Eliquis (apixaban) daily prior to admission, now on aspirin 81.  Therapy recommendations: SNF Disposition: Pending  History of CVA and seizure 10/2014 admitted for focal seizure.  Patient not on AED. 10/2021 presenting with speech disturbance and right upper extremity weakness.  CT showed left large subcortical infarct.  CTA head and neck left ICA 60% stenosis.  Carotid Doppler left ICA 40 to 59% stenosis.  EF 55 to 60%.  LDL 86, A1c 6.2. It was felt that her stroke was due to PAF with Eliquis nonadherence as patient stopped taking AC as she perceived "it was not doing anything for me". Discharged on Eliquis and Crestor 40. Patient had residual dysarthria and improving RUE weakness  Dysarthria  Did not pass swallow NPO now On TF Needs speech re-evaluation to see if can avoid PEG for SNF placement  Paroxysmal atrial fibrillation Previously noncompliant with CVA in 10/2021 Patient endorses current compliance with Eliquis  Hold all Eliquis for now given [facility] On aspirin 81 now  Hypertension Home meds: Lopressor Stable  On Norvasc 10  long-term BP goal normotensive  Hyperlipidemia Home meds: Crestor 40 mg, resumed in hospital LDL 82, goal < 70 Continue statin at discharge  PAD status post bilateral AKA Right leg pain Right leg warm, no sign of ischemic limb  pain likely due to PAD versus lying in the bed without being up and about with her prosthesis as she usually is. Continue gabapentin 100 tid Tylenol and oxycodone as needed Patient has not had these PRNs and I asked the nurse to please obtain her Tylenol for  now.  Other Stroke Risk Factors Advanced Age >/= 68  Former cigarette smoker Coronary artery disease  Other Active Problems SSS status post pacemaker Sjogren syndrome  Hospital day # 6  Mercedes [name]  09/03/2022 11:56 AM  ATTENDING NOTE: I reviewed above note and agree with the assessment and plan. Pt was seen and examined.   No family at bedside.  Patient lying in bed, still has moderate dysarthria, neuro stable unchanged, no acute event overnight.  On tube feeding, pending speech re-evaluation.  Still complaining of bilateral lower extremity pain, right more than left, continue gabapentin.  PT therapy recommend SNF.  Pending  placement.  Continue aspirin and 81 for now given SAH.  Continue statin.  For detailed assessment and plan, please refer to above/below as I have made changes wherever appropriate.   Rosalin Hawking, MD PhD Stroke Neurology 09/03/2022 5:45 PM    To contact Stroke Continuity provider, please refer to [email or web address] After hours, contact General Neurology

## 2022-09-04 DIAGNOSIS — I739 Peripheral vascular disease, unspecified: Secondary | ICD-10-CM | POA: Diagnosis not present

## 2022-09-04 DIAGNOSIS — I6389 Other cerebral infarction: Secondary | ICD-10-CM | POA: Diagnosis not present

## 2022-09-04 DIAGNOSIS — I63231 Cerebral infarction due to unspecified occlusion or stenosis of right carotid arteries: Secondary | ICD-10-CM | POA: Diagnosis not present

## 2022-09-04 LAB — GLUCOSE, CAPILLARY
Glucose-Capillary: 122 mg/dL — ABNORMAL HIGH (ref 70–99)
Glucose-Capillary: 123 mg/dL — ABNORMAL HIGH (ref 70–99)
Glucose-Capillary: 126 mg/dL — ABNORMAL HIGH (ref 70–99)
Glucose-Capillary: 138 mg/dL — ABNORMAL HIGH (ref 70–99)
Glucose-Capillary: 141 mg/dL — ABNORMAL HIGH (ref 70–99)
Glucose-Capillary: 173 mg/dL — ABNORMAL HIGH (ref 70–99)

## 2022-09-04 LAB — CBC
HCT: 37.5 % (ref 36.0–46.0)
Hemoglobin: 11.2 g/dL — ABNORMAL LOW (ref 12.0–15.0)
MCH: 23 pg — ABNORMAL LOW (ref 26.0–34.0)
MCHC: 29.9 g/dL — ABNORMAL LOW (ref 30.0–36.0)
MCV: 77.2 fL — ABNORMAL LOW (ref 80.0–100.0)
Platelets: 447 10*3/uL — ABNORMAL HIGH (ref 150–400)
RBC: 4.86 MIL/uL (ref 3.87–5.11)
RDW: 18.3 % — ABNORMAL HIGH (ref 11.5–15.5)
WBC: 7.4 10*3/uL (ref 4.0–10.5)
nRBC: 0 % (ref 0.0–0.2)

## 2022-09-04 LAB — BASIC METABOLIC PANEL
Anion gap: 6 (ref 5–15)
BUN: 41 mg/dL — ABNORMAL HIGH (ref 8–23)
CO2: 21 mmol/L — ABNORMAL LOW (ref 22–32)
Calcium: 8.8 mg/dL — ABNORMAL LOW (ref 8.9–10.3)
Chloride: 109 mmol/L (ref 98–111)
Creatinine, Ser: 0.76 mg/dL (ref 0.44–1.00)
GFR, Estimated: >60 mL/min (ref >60)
Glucose, Bld: 104 mg/dL — ABNORMAL HIGH (ref 70–99)
Potassium: 4.8 mmol/L (ref 3.5–5.1)
Sodium: 136 mmol/L (ref 135–145)

## 2022-09-04 MED ORDER — GABAPENTIN 250 MG/5ML PO SOLN
200.0000 mg | Freq: Three times a day (TID) | ORAL | Status: DC
  Administered 2022-09-04 – 2022-09-13 (×25): 200 mg
  Filled 2022-09-04 (×27): qty 4

## 2022-09-04 MED ORDER — BISACODYL 10 MG RE SUPP
10.0000 mg | Freq: Every day | RECTAL | Status: DC | PRN
  Administered 2022-09-06: 10 mg via RECTAL
  Filled 2022-09-04: qty 1

## 2022-09-04 MED ORDER — POLYETHYLENE GLYCOL 3350 17 G PO PACK
17.0000 g | PACK | Freq: Every day | ORAL | Status: DC
  Administered 2022-09-04 – 2022-09-07 (×4): 17 g
  Filled 2022-09-04 (×6): qty 1

## 2022-09-04 MED ORDER — DOCUSATE SODIUM 50 MG/5ML PO LIQD
100.0000 mg | Freq: Two times a day (BID) | ORAL | Status: DC
  Administered 2022-09-04 – 2022-09-12 (×13): 100 mg
  Filled 2022-09-04 (×17): qty 10

## 2022-09-04 MED ORDER — POLYETHYLENE GLYCOL 3350 17 G PO PACK: 17.0000 g | PACK | Freq: Every day | ORAL | Status: DC

## 2022-09-04 NOTE — Progress Notes (Signed)
Occupational Therapy Treatment Patient Details Name: Mercedes [name] MRN: [id number] DOB: 15-Mar-1956 Today's Date: 09/04/2022   History of present illness 67 yo s/p 08/28/22 R ICA occlusion with thrombectomy NIH 19 PMH L frontal CVA B AKA, takotsubo CM, SLE, RA, PAD, MI, HTN, CAD.   OT comments  Patient received in supine and placed in chair position to address grooming, attention to left and visual scanning, and LUE ROM. Patient demonstrated improvement with LUE AROM for finger flexion and extension and wrist flexion/extension and pronation. Patient able to locate items to left with mod verbal cues.  Patient able to pull self forward in bed with use of bed rail to allow for lotion on back and held for >1 minute. Discharge recommendations continue to be appropriate for SNF to provide level of care needed at this time and for continued OT to address grooming and sitting balance.    Recommendations for follow up therapy are one component of a multi-disciplinary discharge planning process, led by the attending physician.  Recommendations may be updated based on patient status, additional functional criteria and insurance authorization.    Follow Up Recommendations  Skilled nursing-short term rehab (<3 hours/day)     Assistance Recommended at Discharge Intermittent Supervision/Assistance  Patient can return home with the following  Two people to help with walking and/or transfers;A lot of help with bathing/dressing/bathroom   Equipment Recommendations  Wheelchair (measurements OT);Wheelchair cushion (measurements OT);Hospital bed;Other (comment) (hoyer)    Recommendations for Other Services      Precautions / Restrictions Precautions Precautions: Fall Precaution Comments: SBP 120-140 Restrictions Weight Bearing Restrictions: No       Mobility Bed Mobility Overal bed mobility: Needs Assistance             General bed mobility comments: pulled up into sitting with RUE using rail  to assist    Transfers                         Balance                                           ADL either performed or assessed with clinical judgement   ADL Overall ADL's : Needs assistance/impaired     Grooming: Wash/dry hands;Wash/dry face;Moderate assistance Grooming Details (indicate cue type and reason): able to wash face with RUE and required assistance with washing hands due to LUE limitations                                    Extremity/Trunk Assessment Upper Extremity Assessment LUE Deficits / Details: no movement LUE shoulder and elbow, active finger flexion/extension            Vision       Perception     Praxis      Cognition Arousal/Alertness: Awake/alert Behavior During Therapy: Flat affect Overall Cognitive Status: Within Functional Limits for tasks assessed                                 General Comments: limited vocalization        Exercises Exercises: General Upper Extremity General Exercises - Upper Extremity Shoulder Flexion: PROM, Left, 10 reps Digit Composite Flexion: AROM, 10 reps, Left  Composite Extension: AROM, Left, 10 reps    Shoulder Instructions       General Comments HR 72, SpO2 95, and BP 138/67.  Addressed locating items in left visual field with mod verbal cues to attend to left    Pertinent Vitals/ Pain       Pain Assessment Pain Assessment: Faces Faces Pain Scale: Hurts a little bit Pain Location: BLE Pain Descriptors / Indicators: Grimacing, Discomfort Pain Intervention(s): Limited activity within patient's tolerance, Monitored during session  Home Living                                          Prior Functioning/Environment              Frequency  Min 2X/week        Progress Toward Goals  OT Goals(current goals can now be found in the care plan section)  Progress towards OT goals: Progressing toward goals  Acute Rehab  OT Goals OT Goal Formulation: Patient unable to participate in goal setting Time For Goal Achievement: 09/12/22 Potential to Achieve Goals: Good ADL Goals Pt Will Perform Grooming: with mod assist;sitting Pt Will Perform Upper Body Bathing: with mod assist;sitting Additional ADL Goal #1: pt will locate object in L visual field 2 out 3 attempts with R UE Additional ADL Goal #2: pt will demonstrate static sitting balance Mod (A) as precursor to alds.  Plan Discharge plan remains appropriate    Co-evaluation                 AM-PAC OT "6 Clicks" Daily Activity     Outcome Measure   Help from another person eating meals?: A Lot Help from another person taking care of personal grooming?: A Lot Help from another person toileting, which includes using toliet, bedpan, or urinal?: Total Help from another person bathing (including washing, rinsing, drying)?: Total Help from another person to put on and taking off regular upper body clothing?: A Lot Help from another person to put on and taking off regular lower body clothing?: Total 6 Click Score: 9    End of Session    OT Visit Diagnosis: Unsteadiness on feet (R26.81);Muscle weakness (generalized) (M62.81)   Activity Tolerance Patient tolerated treatment well   Patient Left in bed;with call bell/phone within reach;with bed alarm set   Nurse Communication Mobility status        Time: 6387-5643 OT Time Calculation (min): 24 min  Charges: OT General Charges $OT Visit: 1 Visit OT Treatments $Self Care/Home Management : 8-22 mins $Therapeutic Exercise: 8-22 mins  Lodema Hong, [place]  Office [phone]   Trixie Dredge 09/04/2022, 12:07 PM

## 2022-09-04 NOTE — Progress Notes (Signed)
Speech Language Pathology Treatment: Dysphagia  Patient Details Name: Mercedes [name] MRN: [id number] DOB: 06-29-1956 Today's Date: 09/04/2022 Time: 1335-1400 SLP Time Calculation (min) (ACUTE ONLY): 25 min  Assessment / Plan / Recommendation Clinical Impression  Pt is very eager to have something to drink and does not like the Cortrak in her nose. She has coughing associated with ice chips and spoonfuls of water, although reducing in frequency after first few attempts. She continues to have multiple subswallows per bolus though, and coughing returns when given small sips via straw. Her oral control appears to be mildly improved in that she does not have any anterior oral loss during POs or while administering her own oral care. Recommend proceeding with MBS to better evaluate oropharyngeal function. Note that the pt and the team are needing to make decisions about longer-term alternative nutrition sources if not able to initiate PO diet. MBS can be completed on next date at the earliest. Will complete as soon as able, and in the interim, would offer a few, single pieces of ice at a time after oral care to provide some moisture and increase use of swallowing musculature.    HPI HPI: 67 yo presenting 08/28/22 with L sided weakness, not speaking, R gaze preference. 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 Pt was evaluated by SLP in March 2023 and found to have mild-moderate dysarthria; cognitive-linguistic function and swallowing WFL. PMH includes: L frontal CVA, GERD, sjogren's syndrome, B AKA, takotsubo CM, SLE, RA, PAD, MI, HTN, CAD, anxiety, depression, chiari malformation      SLP Plan  MBS      Recommendations for follow up therapy are one component of a multi-disciplinary discharge planning process, led by the attending physician.  Recommendations may be updated based on patient status, additional functional criteria and insurance authorization.    Recommendations  Diet  recommendations: NPO;Other(comment) (except a few, single pieces of ice after oral care) Medication Administration: Via alternative means                Oral Care Recommendations: Oral care QID;Oral care prior to ice chip/H20 Follow Up Recommendations: Skilled nursing-short term rehab (<3 hours/day) Assistance recommended at discharge: Frequent or constant Supervision/Assistance SLP Visit Diagnosis: Dysphagia, unspecified (R13.10) Plan: MBS           Osie Bond., M.A. [place] Office [phone]  Secure chat preferred   09/04/2022, 3:27 PM

## 2022-09-04 NOTE — Progress Notes (Addendum)
STROKE TEAM PROGRESS NOTE   INTERVAL HISTORY Patient seen lying in her bed.  No family at bedside.  She is awake and alert.   Still reporting bilateral lower extremity discomfort  She continues with tube feeds.  Awaiting speech and physical therapy eval/reeval prior to PEG tube decision.  Will likely need SNF placement.  Vitals:   09/03/22 1923 09/03/22 2319 09/04/22 0333 09/04/22 0802  BP: (!) 150/82 (!) 140/84 (!) 148/88 138/67  Pulse: 72 81 78 80  Resp: 15 17 17 17   Temp: 98.5 F (36.9 C) 98.6 F (37 C) 98.9 F (37.2 C) 98.9 F (37.2 C)  TempSrc: Oral Oral Oral Oral  SpO2: 96% 97% 96% 98%  Weight:   42.9 kg    CBC:  Recent Labs  Lab 08/28/22 1738 08/28/22 1742 09/03/22 0358 09/04/22 0458  WBC 7.5   < > 6.9 7.4  NEUTROABS 3.6  --   --   --   HGB 13.1   < > 11.4* 11.2*  HCT 40.2   < > 35.5* 37.5  MCV 73.6*   < > 73.7* 77.2*  PLT 315   < > 392 447*   < > = values in this interval not displayed.    Basic Metabolic Panel:  Recent Labs  Lab 08/31/22 1703 09/01/22 0550 09/02/22 0424 09/03/22 0358 09/04/22 0458  NA  --  138   < > 138 136  K  --  4.0   < > 4.5 4.8  CL  --  111   < > 106 109  CO2  --  20*   < > 21* 21*  GLUCOSE  --  118*   < > 127* 104*  BUN  --  30*   < > 38* 41*  CREATININE  --  0.87   < > 0.74 0.76  CALCIUM  --  8.9   < > 9.0 8.8*  MG 2.1 2.2  --   --   --   PHOS 2.4* 3.0  --   --   --    < > = values in this interval not displayed.    Lipid Panel:  Recent Labs  Lab 08/29/22 0515  CHOL 144  TRIG 88  HDL 44  CHOLHDL 3.3  VLDL 18  LDLCALC 82    HgbA1c: No results for input(s): "HGBA1C" in the last 168 hours. Urine Drug Screen: No results for input(s): "LABOPIA", "COCAINSCRNUR", "LABBENZ", "AMPHETMU", "THCU", "LABBARB" in the last 168 hours.  Alcohol Level  Recent Labs  Lab 08/28/22 1738  ETH <10     IMAGING past 24 hours No results found.  Temp:  [98.1 F (36.7 C)-99 F (37.2 C)] 98.9 F (37.2 C) (01/08 0802) Pulse  Rate:  [70-81] 80 (01/08 0802) Resp:  [15-18] 17 (01/08 0802) BP: (133-150)/(67-88) 138/67 (01/08 0802) SpO2:  [95 %-98 %] 98 % (01/08 0802) Weight:  [42.9 kg] 42.9 kg (01/08 0333)  General - Well nourished, well developed, in no apparent distress. HEENT- atraumatic, normocephalic  Cardiovascular - Regular rhythm and rate. Pulmonary- equal chest rise; unlabored respirations.  Extremities- BLE AKA  Neuro: Mental Status -  Alert and awake.  Orientation to time, place and person intact.   Language intact. Speech mild to moderate dysarthria. Attention span and concentration were normal.  Right upper and right lower extremities are normal strength and movement-5/5.  Left side is flaccid. She has left facial droop.  Sensation is decreased on the left. Visual fields are intact. Her  extraocular movements are intact. Right gaze preference but crosses midline. She follows commands. Gait is not observed as noted above she has a bilateral AKA.   ASSESSMENT/PLAN Mercedes [name] is a 67 y.o. female with history of CAD, GERD, CVA in March of 2023, HTN, HLD, PAD with bilateral AKA, AF on Eliquis, AICD, Sjogren's syndrome, and cardiomyopathy presenting with right gaze deviation and left-sided weakness. LKW 08/28/22 15:45.  Stroke: Acute right hemispheric infarct due to acute right ICA terminus occlusion.  Etiology is likely embolic secondary to atrial fibrillation s/p IR mechanical thrombectomy with direct contact aspiration of right ICA, MCA, and ACA with complete recanalization.  Code Stroke right M1 segment concerning for acute thrombus.  Slight asymmetric hypoattenuation in the right lentiform nucleus and insular ribbon.  This could represent early infarct. ASPECTS 8/10.   CTA head & neck occlusion of the right ICA in its distal segment without reconstitution prior to the skull base.  The right ICA is occluded through the ICA terminus.  Right M1 and A1 segments are occluded.  Minimal filling  of distal right MCA branch vessels with some collaterals.   IR with ICA, MCA, and ACA achieving complete recanalization.   CT 1/2 right MCA infarct with petechial hemorrhage, interhemispheric SAH CT 1/5 decreased interhemispheric SAH, right BG infarct with petechial hemorrhage, stable. MRI unable to obtain due to PPM. 2D Echo EF 55 to 60% LDL 82 HgbA1c 6.6 VTE prophylaxis -Lovenox Eliquis (apixaban) daily prior to admission, now on aspirin 81.  Therapy recommendations: SNF Disposition: Pending  History of CVA and seizure 10/2014 admitted for focal seizure.  Patient not on AED. 10/2021 presenting with speech disturbance and right upper extremity weakness.  CT showed left large subcortical infarct.  CTA head and neck left ICA 60% stenosis.  Carotid Doppler left ICA 40 to 59% stenosis.  EF 55 to 60%.  LDL 86, A1c 6.2. It was felt that her stroke was due to PAF with Eliquis nonadherence as patient stopped taking AC as she perceived "it was not doing anything for me". Discharged on Eliquis and Crestor 40. Patient had residual dysarthria and improving RUE weakness  Dysarthria  Did not pass swallow NPO now On TF Needs speech re-evaluation to see if can avoid PEG for SNF placement  Paroxysmal atrial fibrillation Previously noncompliant with CVA in 10/2021 Patient endorses current compliance with Eliquis  Hold off Eliquis for now given [facility] On aspirin 81 now  Hypertension Home meds: Lopressor Stable  On Norvasc 10  long-term BP goal normotensive  Hyperlipidemia Home meds: Crestor 40 mg, resumed in hospital LDL 82, goal < 70 Continue statin at discharge  PAD status post bilateral AKA Right leg pain Right leg warm, no sign of ischemic limb  pain likely due to PAD versus lying in the bed without being up and about with her prosthesis as she usually is. Continue gabapentin 100 tid Tylenol and oxycodone as needed Patient has not had these PRNs and I asked the nurse to please obtain her  Tylenol for now.  Other Stroke Risk Factors Advanced Age >/= 84  Former cigarette smoker Coronary artery disease  Other Active Problems SSS status post pacemaker Sjogren syndrome  Hospital day # 7  Patient seen and examined by NP/APP with MD. MD to update note as needed.   Elmer Picker, DNP, FNP-BC [facility] Pager: [phone]  ATTENDING NOTE: I reviewed above note and agree with the assessment and plan. Pt was seen and examined.   No  family at bedside.  Patient lying in bed, mildly lethargic, still did not pass swallow, continue on tube feeding.  Pending MBS tomorrow.  Still complaining of bilateral lower extremity pain with right more than left.  Will increase gabapentin to 200 mg 3 times daily.  Continue aspirin and statin for now.  Will repeat CT later this week to decide on Eliquis resumption.  For detailed assessment and plan, please refer to above/below as I have made changes wherever appropriate.   Marvel Plan, MD PhD Stroke Neurology 09/04/2022 3:42 PM    To contact Stroke Continuity provider, please refer to [email or web address]. After hours, contact General Neurology

## 2022-09-04 NOTE — Progress Notes (Signed)
Physical Therapy Treatment Patient Details Name: Mercedes [name] MRN: [id number] DOB: 05-27-1956 Today's Date: 09/04/2022   History of Present Illness 67 yo s/p 08/28/22 R ICA occlusion with thrombectomy NIH 19 PMH L frontal CVA B AKA, takotsubo CM, SLE, RA, PAD, MI, HTN, CAD.    PT Comments    Pt tolerates treatment well, performing functional transfer with significant assist from PT. Pt initiates well with RUE, but has difficulty scooting L hip posteriorly due to L sided weakness. PT encourages LLE exercise in an effort to improve activation. Pt will benefit from further transfer training in an effort to reduce caregiver burden.   Recommendations for follow up therapy are one component of a multi-disciplinary discharge planning process, led by the attending physician.  Recommendations may be updated based on patient status, additional functional criteria and insurance authorization.  Follow Up Recommendations  Skilled nursing-short term rehab (<3 hours/day) Can patient physically be transported by private vehicle: No   Assistance Recommended at Discharge Frequent or constant Supervision/Assistance  Patient can return home with the following Assist for transportation;Two people to help with bathing/dressing/bathroom;Two people to help with walking and/or transfers;Help with stairs or ramp for entrance   Equipment Recommendations  Hospital bed;Other (comment) (hoyer lift)    Recommendations for Other Services       Precautions / Restrictions Precautions Precautions: Fall Precaution Comments: SBP 120-140 Restrictions Weight Bearing Restrictions: No Other Position/Activity Restrictions: h/o B AKA     Mobility  Bed Mobility Overal bed mobility: Needs Assistance Bed Mobility: Supine to Sit     Supine to sit: Mod assist, HOB elevated     General bed mobility comments: supine to long sitting, use of RUE to pull into long sitting along with PT support at trunk     Transfers Overall transfer level: Needs assistance   Transfers: Bed to chair/wheelchair/BSC         Anterior-Posterior transfers: Max assist   General transfer comment: pt utilizes RLE to assist in maintaining balance vs pushing to scoot, maxA to slide posteriorly as pt has a difficult time sliding L hip due to weakness from CVA    Ambulation/Gait                   Stairs             Wheelchair Mobility    Modified Rankin (Stroke Patients Only) Modified Rankin (Stroke Patients Only) Pre-Morbid Rankin Score: Moderately severe disability Modified Rankin: Severe disability     Balance Overall balance assessment: Needs assistance Sitting-balance support: Single extremity supported, Feet supported Sitting balance-Leahy Scale: Poor Sitting balance - Comments: modA, posterior or left lateral lean Postural control: Posterior lean, Left lateral lean                                  Cognition Arousal/Alertness: Awake/alert Behavior During Therapy: WFL for tasks assessed/performed Overall Cognitive Status: Within Functional Limits for tasks assessed                                 General Comments: limited vocalization and soft spoken        Exercises Amputee Exercises Hip Extension: AROM, Left, 5 reps Hip ABduction/ADduction: AROM, Both, 5 reps Hip Flexion/Marching: AROM, Left, 5 reps    General Comments General comments (skin integrity, edema, etc.): VSS on RA  Pertinent Vitals/Pain Pain Assessment Pain Assessment: Faces Faces Pain Scale: Hurts a little bit Pain Location: BLE Pain Descriptors / Indicators: Grimacing Pain Intervention(s): Monitored during session    Home Living                          Prior Function            PT Goals (current goals can now be found in the care plan section) Acute Rehab PT Goals Patient Stated Goal: to return to transferring without assistance Progress towards  PT goals: Progressing toward goals    Frequency    Min 3X/week      PT Plan Current plan remains appropriate    Co-evaluation              AM-PAC PT "6 Clicks" Mobility   Outcome Measure  Help needed turning from your back to your side while in a flat bed without using bedrails?: A Lot Help needed moving from lying on your back to sitting on the side of a flat bed without using bedrails?: A Lot Help needed moving to and from a bed to a chair (including a wheelchair)?: A Lot Help needed standing up from a chair using your arms (e.g., wheelchair or bedside chair)?: Total Help needed to walk in hospital room?: Total Help needed climbing 3-5 steps with a railing? : Total 6 Click Score: 9    End of Session   Activity Tolerance: Patient tolerated treatment well Patient left: in chair;with call bell/phone within reach;with chair alarm set Nurse Communication: Mobility status;Other (comment) (RN and tech to laterally slide pt back to bed via drop arm recliner) PT Visit Diagnosis: Other abnormalities of gait and mobility (R26.89);Hemiplegia and hemiparesis Hemiplegia - Right/Left: Left Hemiplegia - dominant/non-dominant: Non-dominant Hemiplegia - caused by: Cerebral infarction     Time: 3151-7616 PT Time Calculation (min) (ACUTE ONLY): 20 min  Charges:  $Therapeutic Activity: 8-22 mins                     Zenaida Niece, PT, DPT Acute Rehabilitation Office [phone]    Zenaida Niece 09/04/2022, 3:30 PM

## 2022-09-04 NOTE — Progress Notes (Signed)
Nutrition Follow-up  DOCUMENTATION CODES:   Underweight, Severe malnutrition in context of chronic illness  INTERVENTION:  Continue tube feeding via Cortrak: Osmolite 1.2 at 46ml/hr (1265ml per day) 72ml ProSource TF 20 daily  Provides 1520 kcal, 86g protein, 958ml free water daily  NUTRITION DIAGNOSIS:   Severe Malnutrition related to chronic illness as evidenced by severe muscle depletion, severe fat depletion.  Ongoing  GOAL:   Patient will meet greater than or equal to 90% of their needs  Goal met via TF  MONITOR:   TF tolerance  REASON FOR ASSESSMENT:   Consult Enteral/tube feeding initiation and management  ASSESSMENT:   Pt with PAD s/p B AKAs, Takotsubo cardiomyopathy, GERD, depression, HLD, HTN, CAD, SLE, sjogren's syndrome, anxiety, and RA admitted from home where she was independent with R ICA occlusion s/p thrombectomy.  1/3: Cortrak placed; TF started   Per Neurology, awaiting ST and PT to assess prior to decision for PEG tube. Will likely need SNF.   Spoke with pt at bedside. She reports that the Cortrak is bothersome to her nose. She denies nausea, emesis or abdominal pain. Denies recent bowel movement. Noted last BM was >/=6 days ago. Senna given today. Colace and Miralax also scheduled. Will monitor. Pt is hopeful for something to eat or drink. Pending BSE by ST.  Admit weight: 43.4 kg Current weight: 42.9 kg  Medications: colace BID, miralax  Labs: BUN 41, CBG's 123-141 x24 hours  UOP: 58ml x24 hours I/O's: +1764 ml since admit  Diet Order:   Diet Order             Diet NPO time specified  Diet effective now                   EDUCATION NEEDS:   No education needs have been identified at this time  Skin:  Skin Assessment: Reviewed RN Assessment  Last BM:  unknown  Height:   Ht Readings from Last 1 Encounters:  07/05/22 5\' 5"  (1.651 m)    Weight:   Wt Readings from Last 1 Encounters:  09/04/22 42.9 kg    Ideal Body  Weight:  47.7 kg (post B AKA)  BMI:  Body mass index is 15.74 kg/m.  Estimated Nutritional Needs:   Kcal:  1400-1600  Protein:  70-95 grams  Fluid:  >1.5 L/day  Clayborne Dana, RDN, LDN Clinical Nutrition

## 2022-09-05 ENCOUNTER — Inpatient Hospital Stay (HOSPITAL_COMMUNITY): Payer: Medicare HMO

## 2022-09-05 DIAGNOSIS — I63231 Cerebral infarction due to unspecified occlusion or stenosis of right carotid arteries: Secondary | ICD-10-CM | POA: Diagnosis not present

## 2022-09-05 DIAGNOSIS — R1312 Dysphagia, oropharyngeal phase: Secondary | ICD-10-CM | POA: Diagnosis not present

## 2022-09-05 LAB — GLUCOSE, CAPILLARY
Glucose-Capillary: 109 mg/dL — ABNORMAL HIGH (ref 70–99)
Glucose-Capillary: 111 mg/dL — ABNORMAL HIGH (ref 70–99)
Glucose-Capillary: 115 mg/dL — ABNORMAL HIGH (ref 70–99)
Glucose-Capillary: 131 mg/dL — ABNORMAL HIGH (ref 70–99)
Glucose-Capillary: 132 mg/dL — ABNORMAL HIGH (ref 70–99)
Glucose-Capillary: 134 mg/dL — ABNORMAL HIGH (ref 70–99)

## 2022-09-05 LAB — CBC
HCT: 35.5 % — ABNORMAL LOW (ref 36.0–46.0)
Hemoglobin: 11.4 g/dL — ABNORMAL LOW (ref 12.0–15.0)
MCH: 24 pg — ABNORMAL LOW (ref 26.0–34.0)
MCHC: 32.1 g/dL (ref 30.0–36.0)
MCV: 74.7 fL — ABNORMAL LOW (ref 80.0–100.0)
Platelets: 505 10*3/uL — ABNORMAL HIGH (ref 150–400)
RBC: 4.75 MIL/uL (ref 3.87–5.11)
RDW: 17.6 % — ABNORMAL HIGH (ref 11.5–15.5)
WBC: 6.7 10*3/uL (ref 4.0–10.5)
nRBC: 0 % (ref 0.0–0.2)

## 2022-09-05 LAB — BASIC METABOLIC PANEL
Anion gap: 9 (ref 5–15)
BUN: 45 mg/dL — ABNORMAL HIGH (ref 8–23)
CO2: 23 mmol/L (ref 22–32)
Calcium: 8.9 mg/dL (ref 8.9–10.3)
Chloride: 105 mmol/L (ref 98–111)
Creatinine, Ser: 0.83 mg/dL (ref 0.44–1.00)
GFR, Estimated: >60 mL/min (ref >60)
Glucose, Bld: 108 mg/dL — ABNORMAL HIGH (ref 70–99)
Potassium: 4.8 mmol/L (ref 3.5–5.1)
Sodium: 137 mmol/L (ref 135–145)

## 2022-09-05 NOTE — Progress Notes (Signed)
Called pt's daughter Meka to request that they bring pt's glasses and prosthetics.  Meka states she will have a family bring them tomorrow.

## 2022-09-05 NOTE — Progress Notes (Signed)
Physical Therapy Treatment Patient Details Name: Mercedes [name] MRN: [id number] DOB: [birth year] Today's Date: 09/05/2022   History of Present Illness 67 yo s/p 08/28/22 R ICA occlusion with thrombectomy NIH 19 PMH L frontal CVA B AKA, takotsubo CM, SLE, RA, PAD, MI, HTN, CAD.    PT Comments    Pt requires encouragement to participate in PT session on this date, only agreeable to bed-level exercise. Pt remains profoundly weak through L side, PROM intact with 2-/5 digit flexion/extension and only flickers of activation noted in triceps and shoulder flexors. PT encourages continued AROM of L hip, with limited strength noted at this time. PT will continue to follow in an effort to improve balance and to reduce caregiver burden. PT recommend SNF placement.   Recommendations for follow up therapy are one component of a multi-disciplinary discharge planning process, led by the attending physician.  Recommendations may be updated based on patient status, additional functional criteria and insurance authorization.  Follow Up Recommendations  Skilled nursing-short term rehab (<3 hours/day) Can patient physically be transported by private vehicle: No   Assistance Recommended at Discharge Frequent or constant Supervision/Assistance  Patient can return home with the following Assist for transportation;Two people to help with bathing/dressing/bathroom;Two people to help with walking and/or transfers;Help with stairs or ramp for entrance   Equipment Recommendations  Hospital bed;Other (comment) (hoyer lift)    Recommendations for Other Services       Precautions / Restrictions Precautions Precautions: Fall Precaution Comments: SBP 120-140 Restrictions Weight Bearing Restrictions: No Other Position/Activity Restrictions: h/o B AKA     Mobility  Bed Mobility                    Transfers                        Ambulation/Gait                   Stairs              Wheelchair Mobility    Modified Rankin (Stroke Patients Only) Modified Rankin (Stroke Patients Only) Pre-Morbid Rankin Score: Moderately severe disability Modified Rankin: Severe disability     Balance                                            Cognition Arousal/Alertness: Awake/alert Behavior During Therapy: WFL for tasks assessed/performed Overall Cognitive Status: Within Functional Limits for tasks assessed                                 General Comments: limited vocalization during session but volume of speech is improved when communicating verbally today        Exercises General Exercises - Upper Extremity Shoulder Flexion: PROM, Left, 10 reps Shoulder ABduction: PROM, Left, 10 reps Elbow Flexion: PROM, Left, 10 reps Elbow Extension: PROM, Left, 10 reps Digit Composite Flexion: Left, 10 reps, AAROM Composite Extension: Left, 10 reps, AAROM Amputee Exercises Hip Extension: AROM, Left, 5 reps Hip ABduction/ADduction: AROM, 5 reps, Left Hip Flexion/Marching: AROM, Left, 5 reps Other Exercises Other Exercises: L shoulder external rotation and internal rotation x 10 reps PROM    General Comments General comments (skin integrity, edema, etc.): VSS on RA      Pertinent Vitals/Pain Pain Assessment  Pain Assessment: Faces Faces Pain Scale: Hurts a little bit Pain Location: BLE Pain Descriptors / Indicators: Grimacing Pain Intervention(s): Limited activity within patient's tolerance    Home Living                          Prior Function            PT Goals (current goals can now be found in the care plan section) Acute Rehab PT Goals Patient Stated Goal: to return to transferring without assistance Progress towards PT goals: Not progressing toward goals - comment (pt limiting mobility this session, continues to demonstrate profound L weakness)    Frequency    Min 3X/week      PT Plan Current plan  remains appropriate    Co-evaluation              AM-PAC PT "6 Clicks" Mobility   Outcome Measure  Help needed turning from your back to your side while in a flat bed without using bedrails?: A Lot Help needed moving from lying on your back to sitting on the side of a flat bed without using bedrails?: A Lot Help needed moving to and from a bed to a chair (including a wheelchair)?: A Lot Help needed standing up from a chair using your arms (e.g., wheelchair or bedside chair)?: Total Help needed to walk in hospital room?: Total Help needed climbing 3-5 steps with a railing? : Total 6 Click Score: 9    End of Session   Activity Tolerance: Patient limited by fatigue (pt tired upon PT arrival, requires encouragement to participate in  PT session) Patient left: in bed;with call bell/phone within reach;with bed alarm set Nurse Communication: Mobility status PT Visit Diagnosis: Other abnormalities of gait and mobility (R26.89);Hemiplegia and hemiparesis Hemiplegia - Right/Left: Left Hemiplegia - dominant/non-dominant: Non-dominant Hemiplegia - caused by: Cerebral infarction     Time: 6433-2951 PT Time Calculation (min) (ACUTE ONLY): 14 min  Charges:  $Therapeutic Exercise: 8-22 mins                     Zenaida Niece, PT, DPT Acute Rehabilitation Office [place] Jennelle [place] 09/05/2022, 3:24 PM

## 2022-09-05 NOTE — Progress Notes (Signed)
Modified Barium Swallow Progress Note  Patient Details  Name: Mercedes [name] MRN: [id number] Date of Birth: 06-13-1956  Today's Date: 09/05/2022  Modified Barium Swallow completed.  Full report located under Chart Review in the Imaging Section.  Brief recommendations include the following:  Clinical Impression  Pt has a moderate oropharyngeal dysphagia. She has L sided facial weakness impacting labial seal, with anterior loss of liquids on her L side, especially via cup. Her oral phase is sluggish at times and she has to use extra pumps to lingually propel boluses. Bolus cohesion is reduced and oral residue is noted. As she is work on pushing boluses posteriorly, thin and nectar thick liquids often spill as far as to the pyriform sinuses. When they reach that far, there is a timing issue for pt to be able to adequately protect her airway. Thin liquids are intermittently silently aspiration. Nectar thick liquids were penetrated more than aspirated, but when she aspirate, it was a larger volume. She did cough with this thicker and larger bolus, and it looked like she was able to clear most if not all of the barium from her trachea. She can better coordinate her swallow with honey thick liquids and purees though and there is no airway invasion. Recommend Dys 2 diet and honey thick liquids for now. If pt can work on consistent small sips, facilitating timing and control, she may have good prognosis to advance.   Swallow Evaluation Recommendations       SLP Diet Recommendations: Dysphagia 2 (Fine chop) solids;Honey thick liquids   Liquid Administration via: Cup;Straw   Medication Administration: Crushed with puree   Supervision: Staff to assist with self feeding   Compensations: Minimize environmental distractions;Slow rate;Small sips/bites;Lingual sweep for clearance of pocketing;Monitor for anterior loss   Postural Changes: Seated upright at 90 degrees   Oral Care Recommendations: Oral  care BID   Other Recommendations: Prohibited food (jello, ice cream, thin soups);Remove water pitcher    Osie Bond., M.A. [place] Office [phone]  Secure chat preferred  09/05/2022,1:20 PM

## 2022-09-05 NOTE — Progress Notes (Addendum)
STROKE TEAM PROGRESS NOTE   INTERVAL HISTORY Patient seen lying in her bed.  No family at bedside.  She is awake and alert.   Still reporting bilateral lower extremity discomfort  Will likely need SNF placement. MBS today and upgraded to dysphagia diet with honey thick liquids  Vitals:   09/04/22 2002 09/04/22 2358 09/05/22 0300 09/05/22 1117  BP: 138/76 (!) 151/78  139/76  Pulse: 73 79  86  Resp: 17 17  19   Temp: 99 F (37.2 C) 98.8 F (37.1 C) 98.2 F (36.8 C) 99 F (37.2 C)  TempSrc: Oral Oral Oral Oral  SpO2: 96% 95%  94%  Weight:       CBC:  Recent Labs  Lab 09/04/22 0458 09/05/22 0448  WBC 7.4 6.7  HGB 11.2* 11.4*  HCT 37.5 35.5*  MCV 77.2* 74.7*  PLT 447* 505*    Basic Metabolic Panel:  Recent Labs  Lab 08/31/22 1703 09/01/22 0550 09/02/22 0424 09/04/22 0458 09/05/22 0448  NA  --  138   < > 136 137  K  --  4.0   < > 4.8 4.8  CL  --  111   < > 109 105  CO2  --  20*   < > 21* 23  GLUCOSE  --  118*   < > 104* 108*  BUN  --  30*   < > 41* 45*  CREATININE  --  0.87   < > 0.76 0.83  CALCIUM  --  8.9   < > 8.8* 8.9  MG 2.1 2.2  --   --   --   PHOS 2.4* 3.0  --   --   --    < > = values in this interval not displayed.    Lipid Panel:  No results for input(s): "CHOL", "TRIG", "HDL", "CHOLHDL", "VLDL", "LDLCALC" in the last 168 hours.  HgbA1c: No results for input(s): "HGBA1C" in the last 168 hours. Urine Drug Screen: No results for input(s): "LABOPIA", "COCAINSCRNUR", "LABBENZ", "AMPHETMU", "THCU", "LABBARB" in the last 168 hours.  Alcohol Level  No results for input(s): "ETH" in the last 168 hours.   IMAGING past 24 hours No results found.  Temp:  [98.2 F (36.8 C)-99 F (37.2 C)] 99 F (37.2 C) (01/09 1117) Pulse Rate:  [73-86] 86 (01/09 1117) Resp:  [17-19] 19 (01/09 1117) BP: (138-151)/(76-78) 139/76 (01/09 1117) SpO2:  [90 %-96 %] 94 % (01/09 1117)  General - Well nourished, well developed, in no apparent distress. HEENT- atraumatic,  normocephalic  Cardiovascular - Regular rhythm and rate. Pulmonary- equal chest rise; unlabored respirations.  Extremities- BLE AKA  Neuro: Mental Status -  Alert and awake.  Orientation to time, place and person intact.   Language intact. Speech mild to moderate dysarthria. Attention span and concentration were normal.  Right upper and right lower extremities are normal strength and movement-5/5.  Left side is flaccid. She has left facial droop.  Sensation is decreased on the left. Visual fields are intact. Her extraocular movements are intact. Right gaze preference but crosses midline. She follows commands. Gait is not observed as noted above she has a bilateral AKA.   ASSESSMENT/PLAN Ms. ADELEE HANNULA is a 67 y.o. female with history of CAD, GERD, CVA in March of 2023, HTN, HLD, PAD with bilateral AKA, AF on Eliquis, AICD, Sjogren's syndrome, and cardiomyopathy presenting with right gaze deviation and left-sided weakness. LKW 08/28/22 15:45.  Stroke: Acute right hemispheric infarct due to acute right  ICA terminus occlusion.  Etiology is likely embolic secondary to atrial fibrillation s/p IR mechanical thrombectomy with direct contact aspiration of right ICA, MCA, and ACA with complete recanalization.  Code Stroke right M1 segment concerning for acute thrombus.  Slight asymmetric hypoattenuation in the right lentiform nucleus and insular ribbon.  This could represent early infarct. ASPECTS 8/10.   CTA head & neck occlusion of the right ICA in its distal segment without reconstitution prior to the skull base.  The right ICA is occluded through the ICA terminus.  Right M1 and A1 segments are occluded.  Minimal filling of distal right MCA branch vessels with some collaterals.   IR with ICA, MCA, and ACA achieving complete recanalization.   CT 1/2 right MCA infarct with petechial hemorrhage, interhemispheric SAH CT 1/5 decreased interhemispheric SAH, right BG infarct with petechial  hemorrhage, stable. MRI unable to obtain due to PPM. 2D Echo EF 55 to 60% LDL 82 HgbA1c 6.6 VTE prophylaxis -Lovenox Eliquis (apixaban) daily prior to admission, now on aspirin 81. Will repeat CT in 2 days, if [facility] further improves, will resume [facility]  Therapy recommendations: SNF Disposition: Pending  History of CVA and seizure 10/2014 admitted for focal seizure.  Patient not on AED. 10/2021 presenting with speech disturbance and right upper extremity weakness.  CT showed left large subcortical infarct.  CTA head and neck left ICA 60% stenosis.  Carotid Doppler left ICA 40 to 59% stenosis.  EF 55 to 60%.  LDL 86, A1c 6.2. It was felt that her stroke was due to PAF with Eliquis nonadherence as patient stopped taking AC as she perceived "it was not doing anything for me". Discharged on Eliquis and Crestor 40. Patient had residual dysarthria and improving RUE weakness  Dysphagia MBS- done 1/9- dysphagia 2 with honey thick liquids Cortrak in place with TF until she is taking PO consistently  May consider nocturnal feeding if po more improved  Paroxysmal atrial fibrillation Previously noncompliant with CVA in 10/2021 Patient endorses current compliance with Eliquis  Hold off Eliquis for now given [facility] On aspirin 81 now. Will repeat CT in 2 days, if [facility] further improves, will resume AC  Hypertension Home meds: Lopressor Stable  On Norvasc 10  long-term BP goal normotensive  Hyperlipidemia Home meds: Crestor 40 mg, resumed in hospital LDL 82, goal < 70 Continue statin at discharge  PAD status post bilateral AKA Right leg pain Right leg warm, no sign of ischemic limb  pain likely due to PAD versus lying in the bed without being up and about with her prosthesis as she usually is. Continue gabapentin 100 tid Tylenol and oxycodone as needed Patient has not had these PRNs and I asked the nurse to please obtain her Tylenol for now.  Other Stroke Risk Factors Advanced Age >/= 1  Former  cigarette smoker Coronary artery disease  Other Active Problems SSS status post pacemaker Sjogren syndrome  Hospital day # 8  Patient seen and examined by NP/APP with MD. MD to update note as needed.   Elmer Picker, DNP, FNP-BC [facility] Pager: [phone]  ATTENDING NOTE: I reviewed above note and agree with the assessment and plan. Pt was seen and examined.   No family at bedside.  Patient passed a swallow after modified barium study, currently on dysphagia 2 with honey thick liquid.  Continue to feeding for now but may consider nocturnal tube feeding if p.o. improves.  Still complaining of bilateral lower extremity pain, request family to bring her  prosthetics so that she can move more with PT/OT which will help her LE pain.  Continue aspirin and statin.  Plan to repeat CT in 2 days, if stable, may consider resume AC.  For detailed assessment and plan, please refer to above/below as I have made changes wherever appropriate.   Rosalin Hawking, MD PhD Stroke Neurology 09/05/2022 7:19 PM   To contact Stroke Continuity provider, please refer to [email or web address] After hours, contact General Neurology

## 2022-09-06 DIAGNOSIS — I63231 Cerebral infarction due to unspecified occlusion or stenosis of right carotid arteries: Secondary | ICD-10-CM | POA: Diagnosis not present

## 2022-09-06 DIAGNOSIS — R1312 Dysphagia, oropharyngeal phase: Secondary | ICD-10-CM | POA: Diagnosis not present

## 2022-09-06 LAB — GLUCOSE, CAPILLARY
Glucose-Capillary: 119 mg/dL — ABNORMAL HIGH (ref 70–99)
Glucose-Capillary: 114 mg/dL — ABNORMAL HIGH (ref 70–99)
Glucose-Capillary: 135 mg/dL — ABNORMAL HIGH (ref 70–99)
Glucose-Capillary: 122 mg/dL — ABNORMAL HIGH (ref 70–99)
Glucose-Capillary: 149 mg/dL — ABNORMAL HIGH (ref 70–99)
Glucose-Capillary: 119 mg/dL — ABNORMAL HIGH (ref 70–99)

## 2022-09-06 LAB — BASIC METABOLIC PANEL
Anion gap: 7 (ref 5–15)
BUN: 39 mg/dL — ABNORMAL HIGH (ref 8–23)
CO2: 25 mmol/L (ref 22–32)
Calcium: 8.9 mg/dL (ref 8.9–10.3)
Chloride: 103 mmol/L (ref 98–111)
Creatinine, Ser: 0.79 mg/dL (ref 0.44–1.00)
GFR, Estimated: >60 mL/min (ref >60)
Glucose, Bld: 113 mg/dL — ABNORMAL HIGH (ref 70–99)
Potassium: 4.4 mmol/L (ref 3.5–5.1)
Sodium: 135 mmol/L (ref 135–145)

## 2022-09-06 LAB — CBC
HCT: 34.1 % — ABNORMAL LOW (ref 36.0–46.0)
Hemoglobin: 10.6 g/dL — ABNORMAL LOW (ref 12.0–15.0)
MCH: 23.3 pg — ABNORMAL LOW (ref 26.0–34.0)
MCHC: 31.1 g/dL (ref 30.0–36.0)
MCV: 74.9 fL — ABNORMAL LOW (ref 80.0–100.0)
Platelets: 542 10*3/uL — ABNORMAL HIGH (ref 150–400)
RBC: 4.55 MIL/uL (ref 3.87–5.11)
RDW: 17.5 % — ABNORMAL HIGH (ref 11.5–15.5)
WBC: 7.1 10*3/uL (ref 4.0–10.5)
nRBC: 0 % (ref 0.0–0.2)

## 2022-09-06 MED ORDER — OSMOLITE 1.2 CAL PO LIQD
1000.0000 mL | ORAL | Status: DC
  Administered 2022-09-06 – 2022-09-10 (×6): 1000 mL

## 2022-09-06 MED ORDER — OSMOLITE 1.2 CAL PO LIQD: 1000.0000 mL | ORAL | Status: DC

## 2022-09-06 NOTE — Progress Notes (Addendum)
STROKE TEAM PROGRESS NOTE   INTERVAL HISTORY Patient seen lying in her bed.  No family at bedside.  Her vital signs have been stable, and her neurological exam is unchanged.  She continues to report bilateral lower extremity discomfort and states she is willing to try to eat more so that her cortrak tube can be discontinued.  Vitals:   09/06/22 0308 09/06/22 0500 09/06/22 0757 09/06/22 1100  BP: 139/68  135/69 132/69  Pulse:   71 76  Resp:   17 17  Temp: 98.7 F (37.1 C)  99 F (37.2 C) 98.9 F (37.2 C)  TempSrc: Oral  Oral Oral  SpO2:   96% 94%  Weight:  45.3 kg     CBC:  Recent Labs  Lab 09/05/22 0448 09/06/22 0245  WBC 6.7 7.1  HGB 11.4* 10.6*  HCT 35.5* 34.1*  MCV 74.7* 74.9*  PLT 505* 542*    Basic Metabolic Panel:  Recent Labs  Lab 08/31/22 1703 09/01/22 0550 09/02/22 0424 09/05/22 0448 09/06/22 0245  NA  --  138   < > 137 135  K  --  4.0   < > 4.8 4.4  CL  --  111   < > 105 103  CO2  --  20*   < > 23 25  GLUCOSE  --  118*   < > 108* 113*  BUN  --  30*   < > 45* 39*  CREATININE  --  0.87   < > 0.83 0.79  CALCIUM  --  8.9   < > 8.9 8.9  MG 2.1 2.2  --   --   --   PHOS 2.4* 3.0  --   --   --    < > = values in this interval not displayed.    Lipid Panel:  No results for input(s): "CHOL", "TRIG", "HDL", "CHOLHDL", "VLDL", "LDLCALC" in the last 168 hours.  HgbA1c: No results for input(s): "HGBA1C" in the last 168 hours. Urine Drug Screen: No results for input(s): "LABOPIA", "COCAINSCRNUR", "LABBENZ", "AMPHETMU", "THCU", "LABBARB" in the last 168 hours.  Alcohol Level  No results for input(s): "ETH" in the last 168 hours.   IMAGING past 24 hours No results found.  Temp:  [98.7 F (37.1 C)-99 F (37.2 C)] 98.9 F (37.2 C) (01/10 1100) Pulse Rate:  [71-76] 76 (01/10 1100) Resp:  [16-17] 17 (01/10 1100) BP: (132-139)/(68-74) 132/69 (01/10 1100) SpO2:  [94 %-96 %] 94 % (01/10 1100) Weight:  [45.3 kg] 45.3 kg (01/10 0500)  General - Well  nourished, well developed, in no apparent distress. HEENT- atraumatic, normocephalic  Cardiovascular - Regular rhythm and rate. Pulmonary- equal chest rise; unlabored respirations.  Extremities- BLE AKA  Neuro: Mental Status -  Alert and awake.  Orientation to time, place and person intact.   Language intact. Speech moderate dysarthria. Attention span and concentration were normal.  Right upper and right lower extremities are normal strength and movement-5/5.  Left upper and lower extremities with 2 out of 5 strength She has left facial droop.  Sensation is decreased on the left. Visual fields are intact. Her extraocular movements are intact. Right gaze preference but crosses midline. She follows commands. Gait is not observed as noted above she has a bilateral AKA.   ASSESSMENT/PLAN Mercedes [name] is a 67 y.o. female with history of CAD, GERD, CVA in March of 2023, HTN, HLD, PAD with bilateral AKA, AF on Eliquis, AICD, Sjogren's syndrome, and cardiomyopathy presenting with  right gaze deviation and left-sided weakness. LKW 08/28/22 15:45.  Stroke: Acute right hemispheric infarct due to acute right ICA terminus occlusion.  Etiology is likely embolic secondary to atrial fibrillation s/p IR mechanical thrombectomy with direct contact aspiration of right ICA, MCA, and ACA with complete recanalization.  Code Stroke right M1 segment concerning for acute thrombus.  Slight asymmetric hypoattenuation in the right lentiform nucleus and insular ribbon.  This could represent early infarct. ASPECTS 8/10.   CTA head & neck occlusion of the right ICA in its distal segment without reconstitution prior to the skull base.  The right ICA is occluded through the ICA terminus.  Right M1 and A1 segments are occluded.  Minimal filling of distal right MCA branch vessels with some collaterals.   IR with ICA, MCA, and ACA achieving complete recanalization.   CT 1/2 right MCA infarct with petechial  hemorrhage, interhemispheric SAH CT 1/5 decreased interhemispheric SAH, right BG infarct with petechial hemorrhage, stable. MRI unable to obtain due to PPM. 2D Echo EF 55 to 60% LDL 82 HgbA1c 6.6 VTE prophylaxis -Lovenox Eliquis (apixaban) daily prior to admission, now on aspirin 81. Will repeat CT in am, if [facility] further improves, will resume [facility]  Therapy recommendations: SNF Disposition: Pending  History of CVA and seizure 10/2014 admitted for focal seizure.  Patient not on AED. 10/2021 presenting with speech disturbance and right upper extremity weakness.  CT showed left large subcortical infarct.  CTA head and neck left ICA 60% stenosis.  Carotid Doppler left ICA 40 to 59% stenosis.  EF 55 to 60%.  LDL 86, A1c 6.2. It was felt that her stroke was due to PAF with Eliquis nonadherence as patient stopped taking AC as she perceived "it was not doing anything for me". Discharged on Eliquis and Crestor 40. Patient had residual dysarthria and improving RUE weakness  Dysphagia MBS- done 1/9- dysphagia 2 with honey thick liquids Cortrak in place with TF until she is taking PO consistently  May consider nocturnal feeding if po more improved  Paroxysmal atrial fibrillation Previously noncompliant with CVA in 10/2021 Patient endorses current compliance with Eliquis  Hold off Eliquis for now given [facility] On aspirin 81 now. Will repeat CT in am, if [facility] further improves, will resume AC  Hypertension Home meds: Lopressor Stable  On Norvasc 10  long-term BP goal normotensive  Hyperlipidemia Home meds: Crestor 40 mg, resumed in hospital LDL 82, goal < 70 Continue statin at discharge  PAD status post bilateral AKA Right leg pain Right leg warm, no sign of ischemic limb  pain likely due to PAD versus lying in the bed without being up and about with her prosthesis as she usually is. Continue gabapentin 100->200 tid Tylenol and oxycodone as needed Patient has not had these PRNs and I asked the nurse  to please obtain her Tylenol for now.  Other Stroke Risk Factors Advanced Age >/= 47  Former cigarette smoker Coronary artery disease  Other Active Problems SSS status post pacemaker Sjogren syndrome  Hospital day # 9  Patient seen and examined by NP/APP with MD. MD to update note as needed.   [place] , MSN, AGACNP-BC [facility] See Amion for schedule and pager information 09/06/2022 2:18 PM   ATTENDING NOTE: I reviewed above note and agree with the assessment and plan. Pt was seen and examined.   Pt reclining in bed, no family at the bedside. On dysphagia 2 and honey thick liquid. Had dietitian on board  for nocturnal feeding to allow more po intake during the day to avoid PEG tube for SNF placement. Continue PRN pain meds for leg pain. PT/OT. Repeat CT in am, may resume AC if CT improved. Continue ASA and crestor for now.   For detailed assessment and plan, please refer to above/below as I have made changes wherever appropriate.   Marvel Plan, MD PhD Stroke Neurology 09/06/2022 4:58 PM    To contact Stroke Continuity provider, please refer to [email or web address]. After hours, contact General Neurology

## 2022-09-06 NOTE — Progress Notes (Addendum)
RN received Pt lying in bed alert and oriented. Pt request to check [place] so she could urinate. Pt repositioned, Purwick assessed clean and intact secured with mesh panties. Pt had cont urine. Pericare and foley care provided. Pt assisted with eating dinner (20%) and required set up and assistance with oral suctioning. Tube feeding infusing at 60 ml/hr. HOB greater than 30% Pt tolerating well. No acute distress noted at this time.  Oral care provided at this time with suction and mouthwash.  Rounding Complete. Pt repositioned. Offered PO intake and declined. Safety maintained.  CMS called to report 5 beats of Vtach. Pt assessed. PT without distress noted. No Vtach noted on the monitor at this time. RN will continue to monitor for acute changes

## 2022-09-06 NOTE — Progress Notes (Signed)
Nutrition Follow-up  DOCUMENTATION CODES:   Underweight, Severe malnutrition in context of chronic illness  INTERVENTION:  Change to nocturnal tube feeds via Cortrak: -Provide Osmolite 1.2 at 60 mL/hr x 12 hours overnight from 1800-0600 (720 mL daily volume) -Provide PROSource TF20 60 mL once daily -New goal regimen provides 944 kcal, 60 grams of protein, 590 mL H2O daily -Meets 67% minimum kcal needs and 86% minimum protein needs  Provide Magic cup TID with meals, each supplement provides 290 kcal and 9 grams of protein.  Recommend aggressive bowel regimen until pt has BM as no documented BM this admission.   NUTRITION DIAGNOSIS:   Severe Malnutrition related to chronic illness as evidenced by severe muscle depletion, severe fat depletion.  Ongoing.  GOAL:   Patient will meet greater than or equal to 90% of their needs  Previously met with TF, progressing oral intake.  MONITOR:   TF tolerance  REASON FOR ASSESSMENT:   Consult Enteral/tube feeding initiation and management  ASSESSMENT:   Pt with PAD s/p B AKAs, Takotsubo cardiomyopathy, GERD, depression, HLD, HTN, CAD, SLE, sjogren's syndrome, anxiety, and RA admitted from home where she was independent with R ICA occlusion s/p thrombectomy.  1/3: Cortrak placed; TF started 1/9: diet advanced to dysphagia 2 with honey-thick liquids following MBW with SLP   Met with pt at bedside. She reports she is motivated to try to eat well at meals. She had 25% of breakfast and lunch today. She reports she does not like the food here but is going to try to eat well at meals. Pt is amenable to trying Magic Cup supplements at meals. Noted no documented BM yet this admission.  Admission wt was 43.4 kg. Current wt is 45.3 kg. Will continue to monitor wt trend.  Enteral Access: 10 Fr. Cortrak tube placed 1/3; 63 cm at left nare secured with bridle; terminates in distal stomach per abdominal x-ray 08/30/22  TF regimen: Osmolite 1.2 at  50 mL/hr + PROSource TF20 60 mL daily  Medications reviewed and include: Colace 100 mg BID, gabapentin, Miralax 17 grams daily  Labs reviewed: CBG 114-135, BUN 39  UOP: 500 mL (0.5 mL/kg/hr)  I/O: +814.1 mL since admission  Discussed with RN. Discussed with MD via secure chat. Plan is to change pt to nocturnal tube feeds and encourage PO intake at meals. MD plans to ask RN to provide PRN Dulcolax suppository as pt has not yet had BM and if that does not work will try fleet enema.  Diet Order:   Diet Order             DIET DYS 2 Room service appropriate? No; Fluid consistency: Honey Thick  Diet effective now                  EDUCATION NEEDS:   No education needs have been identified at this time  Skin:  Skin Assessment: Reviewed RN Assessment  Last BM:  Unknown  Height:   Ht Readings from Last 1 Encounters:  07/05/22 5\' 5"  (1.651 m)   Weight:   Wt Readings from Last 1 Encounters:  09/06/22 45.3 kg   Ideal Body Weight:  47.7 kg (post B AKA)  BMI:  Body mass index is 16.62 kg/m.  Estimated Nutritional Needs:   Kcal:  1400-1600  Protein:  70-95 grams  Fluid:  >1.5 L/day  Loanne Drilling, MS, RD, LDN, CNSC Pager number available on Amion

## 2022-09-07 ENCOUNTER — Inpatient Hospital Stay (HOSPITAL_COMMUNITY): Payer: Medicare HMO

## 2022-09-07 DIAGNOSIS — I63231 Cerebral infarction due to unspecified occlusion or stenosis of right carotid arteries: Secondary | ICD-10-CM | POA: Diagnosis not present

## 2022-09-07 LAB — CBC
HCT: 32.3 % — ABNORMAL LOW (ref 36.0–46.0)
Hemoglobin: 9.9 g/dL — ABNORMAL LOW (ref 12.0–15.0)
MCH: 23.3 pg — ABNORMAL LOW (ref 26.0–34.0)
MCHC: 30.7 g/dL (ref 30.0–36.0)
MCV: 76 fL — ABNORMAL LOW (ref 80.0–100.0)
Platelets: 544 10*3/uL — ABNORMAL HIGH (ref 150–400)
RBC: 4.25 MIL/uL (ref 3.87–5.11)
RDW: 17.3 % — ABNORMAL HIGH (ref 11.5–15.5)
WBC: 8.3 10*3/uL (ref 4.0–10.5)
nRBC: 0 % (ref 0.0–0.2)

## 2022-09-07 LAB — GLUCOSE, CAPILLARY
Glucose-Capillary: 126 mg/dL — ABNORMAL HIGH (ref 70–99)
Glucose-Capillary: 100 mg/dL — ABNORMAL HIGH (ref 70–99)
Glucose-Capillary: 106 mg/dL — ABNORMAL HIGH (ref 70–99)
Glucose-Capillary: 100 mg/dL — ABNORMAL HIGH (ref 70–99)
Glucose-Capillary: 205 mg/dL — ABNORMAL HIGH (ref 70–99)
Glucose-Capillary: 146 mg/dL — ABNORMAL HIGH (ref 70–99)

## 2022-09-07 LAB — BASIC METABOLIC PANEL
Anion gap: 9 (ref 5–15)
BUN: 33 mg/dL — ABNORMAL HIGH (ref 8–23)
CO2: 25 mmol/L (ref 22–32)
Calcium: 8.8 mg/dL — ABNORMAL LOW (ref 8.9–10.3)
Chloride: 101 mmol/L (ref 98–111)
Creatinine, Ser: 0.76 mg/dL (ref 0.44–1.00)
GFR, Estimated: >60 mL/min (ref >60)
Glucose, Bld: 119 mg/dL — ABNORMAL HIGH (ref 70–99)
Potassium: 4.3 mmol/L (ref 3.5–5.1)
Sodium: 135 mmol/L (ref 135–145)

## 2022-09-07 MED ORDER — FLEET ENEMA 7-19 GM/118ML RE ENEM
1.0000 | ENEMA | Freq: Once | RECTAL | Status: AC
  Administered 2022-09-07: 1 via RECTAL
  Filled 2022-09-07: qty 1

## 2022-09-07 MED ORDER — APIXABAN 5 MG PO TABS
5.0000 mg | ORAL_TABLET | Freq: Two times a day (BID) | ORAL | Status: DC
  Administered 2022-09-07: 5 mg via ORAL
  Filled 2022-09-07: qty 1

## 2022-09-07 MED ORDER — APIXABAN 5 MG PO TABS
5.0000 mg | ORAL_TABLET | Freq: Two times a day (BID) | ORAL | Status: DC
  Administered 2022-09-07 – 2022-09-13 (×12): 5 mg
  Filled 2022-09-07 (×12): qty 1

## 2022-09-07 NOTE — Progress Notes (Addendum)
STROKE TEAM PROGRESS NOTE   INTERVAL HISTORY Patient seen lying in her bed.  No family at bedside.  Her vital signs have been stable, and her neurological exam is unchanged.  Her prosthetic legs have been brought in from home, so she can ambulate more.  She has not had a bowel movement in quite some time, so fleets enema will be given today.  Head CT shows resolution of subarachnoid hemorrhage so we will restart Eliquis today.  Vitals:   09/06/22 2320 09/07/22 0155 09/07/22 0321 09/07/22 0751  BP: 133/65 133/69 139/74 (!) 146/73  Pulse: 79 78 75 79  Resp: 18 20 17 14   Temp: 99.4 F (37.4 C)  98.9 F (37.2 C) 98.8 F (37.1 C)  TempSrc: Oral  Oral Oral  SpO2: 95% 93% 94% 93%  Weight:   45.3 kg    CBC:  Recent Labs  Lab 09/06/22 0245 09/07/22 0253  WBC 7.1 8.3  HGB 10.6* 9.9*  HCT 34.1* 32.3*  MCV 74.9* 76.0*  PLT 542* 544*    Basic Metabolic Panel:  Recent Labs  Lab 08/31/22 1703 09/01/22 0550 09/02/22 0424 09/06/22 0245 09/07/22 0253  NA  --  138   < > 135 135  K  --  4.0   < > 4.4 4.3  CL  --  111   < > 103 101  CO2  --  20*   < > 25 25  GLUCOSE  --  118*   < > 113* 119*  BUN  --  30*   < > 39* 33*  CREATININE  --  0.87   < > 0.79 0.76  CALCIUM  --  8.9   < > 8.9 8.8*  MG 2.1 2.2  --   --   --   PHOS 2.4* 3.0  --   --   --    < > = values in this interval not displayed.    Lipid Panel:  No results for input(s): "CHOL", "TRIG", "HDL", "CHOLHDL", "VLDL", "LDLCALC" in the last 168 hours.  HgbA1c: No results for input(s): "HGBA1C" in the last 168 hours. Urine Drug Screen: No results for input(s): "LABOPIA", "COCAINSCRNUR", "LABBENZ", "AMPHETMU", "THCU", "LABBARB" in the last 168 hours.  Alcohol Level  No results for input(s): "ETH" in the last 168 hours.   IMAGING past 24 hours CT HEAD WO CONTRAST (5MM)  Result Date: 09/07/2022 CLINICAL DATA:  67 year old female code stroke presentation on January 1st with right ICA T-occlusion, right ACA and MCA affected.  Status post endovascular reperfusion. Subsequent encounter. EXAM: CT HEAD WITHOUT CONTRAST TECHNIQUE: Contiguous axial images were obtained from the base of the skull through the vertex without intravenous contrast. RADIATION DOSE REDUCTION: This exam was performed according to the departmental dose-optimization program which includes automated exposure control, adjustment of the mA and/or kV according to patient size and/or use of iterative reconstruction technique. COMPARISON:  Head CT 09/02/2022 and earlier. FINDINGS: Brain: Right hemisphere cytotoxic edema most confluent at the right basal ganglia and insula has not significantly changed along with mass effect on the right lateral ventricle. Regressed and now virtually resolved small volume subarachnoid hemorrhage. No midline shift or ventriculomegaly. Basilar cisterns remain patent. Stable gray-white matter differentiation elsewhere. No new intracranial hemorrhage. Vascular: Calcified atherosclerosis at the skull base. Skull: No acute osseous abnormality identified. Sinuses/Orbits: Left nasoenteric tube remains in place. Visualized paranasal sinuses and mastoids are stable and well aerated. Other: No acute orbit or scalp soft tissue finding, some rightward gaze persists.  IMPRESSION: 1. Stable right hemisphere infarct with no malignant hemorrhagic transformation. No midline shift. 2. Regressed and now virtually resolved subarachnoid hemorrhage. 3. No new intracranial abnormality. Electronically Signed   By: Odessa Fleming M.D.   On: 09/07/2022 05:55    Temp:  [98.1 F (36.7 C)-99.4 F (37.4 C)] 98.8 F (37.1 C) (01/11 0751) Pulse Rate:  [74-79] 79 (01/11 0751) Resp:  [14-20] 14 (01/11 0751) BP: (125-146)/(63-74) 146/73 (01/11 0751) SpO2:  [91 %-95 %] 93 % (01/11 0751) Weight:  [45.3 kg] 45.3 kg (01/11 0321)  General - Well nourished, well developed, in no apparent distress. HEENT- atraumatic, normocephalic  Cardiovascular - Regular rhythm and  rate. Pulmonary- equal chest rise; unlabored respirations.  Extremities- BLE AKA  Neuro: Mental Status -  Alert and awake.  Orientation to time, place and person intact.   Language intact. Speech moderate dysarthria. Attention span and concentration were normal.  Right upper and right lower extremities are normal strength and movement-5/5.  Left upper and lower extremities with 2 out of 5 strength She has left facial droop.  Sensation is decreased on the left. Visual fields are intact. Her extraocular movements are intact. Right gaze preference but crosses midline. She follows commands. Gait is not observed as noted above she has a bilateral AKA.   ASSESSMENT/PLAN Ms. SADIE HAZELETT is a 67 y.o. female with history of CAD, GERD, CVA in March of 2023, HTN, HLD, PAD with bilateral AKA, AF on Eliquis, AICD, Sjogren's syndrome, and cardiomyopathy presenting with right gaze deviation and left-sided weakness. LKW 08/28/22 15:45.  Stroke: Acute right hemispheric infarct due to acute right ICA terminus occlusion.  Etiology is likely embolic secondary to atrial fibrillation s/p IR with TICI3 Code Stroke right M1 segment concerning for acute thrombus.  Slight asymmetric hypoattenuation in the right lentiform nucleus and insular ribbon.  This could represent early infarct. ASPECTS 8/10.   CTA head & neck occlusion of the right ICA in its distal segment without reconstitution prior to the skull base.  The right ICA is occluded through the ICA terminus.  Right M1 and A1 segments are occluded.  Minimal filling of distal right MCA branch vessels with some collaterals.   IR with ICA, MCA, and ACA achieving complete recanalization.   CT 1/2 right MCA infarct with petechial hemorrhage, interhemispheric SAH CT 1/5 decreased interhemispheric SAH, right BG infarct with petechial hemorrhage, stable. CT 1/11 stable right hemispheric infarct with no hemorrhagic transformation, virtually resolved subarachnoid  hemorrhage MRI unable to obtain due to PPM. 2D Echo EF 55 to 60% LDL 82 HgbA1c 6.6 VTE prophylaxis -fully anticoagulated with Eliquis Eliquis (apixaban) daily prior to admission, now fully anticoagulated with Eliquis Therapy recommendations: SNF Disposition: Pending  History of CVA and seizure 10/2014 admitted for focal seizure.  Patient not on AED. 10/2021 presenting with speech disturbance and right upper extremity weakness.  CT showed left large subcortical infarct.  CTA head and neck left ICA 60% stenosis.  Carotid Doppler left ICA 40 to 59% stenosis.  EF 55 to 60%.  LDL 86, A1c 6.2. It was felt that her stroke was due to PAF with Eliquis nonadherence as patient stopped taking AC as she perceived "it was not doing anything for me". Discharged on Eliquis and Crestor 40. Patient had residual dysarthria and improving RUE weakness  Dysphagia MBS- done 1/9- dysphagia 2 with honey thick liquids Cortrak in place with TF On nocturnal feeding to facilitate po intake  Paroxysmal atrial fibrillation Previously noncompliant with CVA in  10/2021 Patient endorses current compliance with Eliquis  Repeat head CT looks good with virtual resolution of subarachnoid hemorrhage, will restart Eliquis  Hypertension Home meds: Lopressor Stable  On Norvasc 10  long-term BP goal normotensive  Hyperlipidemia Home meds: Crestor 40 mg, resumed in hospital LDL 82, goal < 70 Continue statin at discharge  PAD status post bilateral AKA Right leg pain Right leg warm, no sign of ischemic limb  pain likely due to PAD versus lying in the bed without being up and about with her prosthesis as she usually is. Continue gabapentin 100->200 tid Tylenol and oxycodone as needed  Other Stroke Risk Factors Advanced Age >/= 52  Former cigarette smoker Coronary artery disease  Other Active Problems SSS status post pacemaker Sjogren syndrome Constipation - bowel regimen - fleet enema today  Hospital day #  10  Patient seen and examined by NP/APP with MD. MD to update note as needed.   [place] , MSN, AGACNP-BC [facility] See Amion for schedule and pager information 09/07/2022 11:43 AM   ATTENDING NOTE: I reviewed above note and agree with the assessment and plan. Pt was seen and examined.   Pt neuro stable, speech at the bedside. Pt had protheses today, will work with PT/OT more extensively. Had bowel movement after fleet enema. On nocturnal tube feeding to facilitate po intake. CT repeat showed HT and SAH all resolved, restart eliquis today. Pending SNF.   For detailed assessment and plan, please refer to above/below as I have made changes wherever appropriate.   Rosalin Hawking, MD PhD Stroke Neurology 09/07/2022 7:27 PM    To contact Stroke Continuity provider, please refer to [email or web address] After hours, contact General Neurology

## 2022-09-07 NOTE — Progress Notes (Addendum)
Pt transported with transport tech to CT.  Pt returned from CT for Head CT. Pt alert and oriented. VVS and updated. Tube feeds disconnected and NG tube flushed. Pt tolerated feeds well. No acute distress over night.

## 2022-09-07 NOTE — Progress Notes (Signed)
Occupational Therapy Treatment Patient Details Name: Mercedes [name] MRN: [id number] DOB: Jan 07, 1956 Today's Date: 09/07/2022   History of present illness 67 yo s/p 08/28/22 R ICA occlusion with thrombectomy NIH 19 PMH L frontal CVA B AKA, takotsubo CM, SLE, RA, PAD, MI, HTN, CAD.   OT comments  Pt progressing towards OT goals today, LUE remains similar with movement in digits, but not at elbow or shoulder. Improved attention to L side functionally and able to cross midline with RUE to hit visual targets. Pt maintained sitting EOB for approx 15 min for various ADL and activities with min to mod A (posterior and left lean). Pt mod to max A for peri care at bed level, and max A +2 for AP transfer to recliner. OT will continue to follow acutely, POC remains appropriate at this time.   Of note: 2 prosthetics in room however, no liners so unable to use and attempt ambulation/standing transfers at this time. Did speak with family who came after session and LVM with family asking to bring them to progress.    Recommendations for follow up therapy are one component of a multi-disciplinary discharge planning process, led by the attending physician.  Recommendations may be updated based on patient status, additional functional criteria and insurance authorization.    Follow Up Recommendations  Skilled nursing-short term rehab (<3 hours/day)     Assistance Recommended at Discharge Intermittent Supervision/Assistance  Patient can return home with the following  Two people to help with walking and/or transfers;A lot of help with bathing/dressing/bathroom   Equipment Recommendations  Wheelchair (measurements OT);Wheelchair cushion (measurements OT);Hospital bed;Other (comment) (hoyer)    Recommendations for Other Services Speech consult (concerns for swallow as pt demonstrates poor control of oral secretions)    Precautions / Restrictions Precautions Precautions: Fall Precaution Comments: SBP  120-140 Restrictions Weight Bearing Restrictions: No Other Position/Activity Restrictions: h/o B AKA       Mobility Bed Mobility Overal bed mobility: Needs Assistance Bed Mobility: Supine to Sit, Rolling, Sit to Supine Rolling: Mod assist, +2 for safety/equipment   Supine to sit: Mod assist, HOB elevated Sit to supine: Max assist, +2 for physical assistance   General bed mobility comments: multimodal cues for sequencing, hand placement, trunk assist    Transfers Overall transfer level: Needs assistance Equipment used: None Transfers: Bed to chair/wheelchair/BSC         Anterior-Posterior transfers: Max assist, +2 physical assistance, +2 safety/equipment   General transfer comment: pt utilizes R side to assist in maintaining balance vs pushing to scoot, maxA to slide posteriorly as pt has a difficult time sliding L hip due to weakness from CVA, multimodal cues for sequencing and leaning for AP transfer     Balance Overall balance assessment: Needs assistance Sitting-balance support: Single extremity supported, Feet supported Sitting balance-Leahy Scale: Poor Sitting balance - Comments: modA, posterior or left lateral lean Postural control: Posterior lean, Left lateral lean                                 ADL either performed or assessed with clinical judgement   ADL Overall ADL's : Needs assistance/impaired             Lower Body Bathing: Minimal assistance   Upper Body Dressing : Maximal assistance;Sitting Upper Body Dressing Details (indicate cue type and reason): to don new gown Lower Body Dressing: Maximal assistance;Bed level Lower Body Dressing Details (indicate cue type and  reason): rolling to don new mesh underwear     Toileting- Clothing Manipulation and Hygiene: Minimal assistance;Bed level Toileting - Clothing Manipulation Details (indicate cue type and reason): Pt able to perform front and back peri care while lying in bed      Functional mobility during ADLs: Maximal assistance;+2 for physical assistance;+2 for safety/equipment (AP transfer) General ADL Comments: L inattention, decreased selfawareness of deficits    Extremity/Trunk Assessment Upper Extremity Assessment Upper Extremity Assessment: LUE deficits/detail LUE Deficits / Details: no movement LUE shoulder and elbow, active finger flexion/extension generally 2+/5 grasp LUE Sensation:  (able to identify which finger OT was touching) LUE Coordination: decreased fine motor;decreased gross motor            Vision   Vision Assessment?: Yes Eye Alignment: Within Functional Limits Ocular Range of Motion: Restricted on the left;Impaired-to be further tested in functional context Tracking/Visual Pursuits: Requires cues, head turns, or add eye shifts to track (improving from previous session) Additional Comments: able to identify objects and number from L visual field, and in peripheral vision   Perception     Praxis      Cognition Arousal/Alertness: Awake/alert Behavior During Therapy: WFL for tasks assessed/performed Overall Cognitive Status: Within Functional Limits for tasks assessed                                 General Comments: limited vocalization during session but volume of speech is improved when communicating verbally today        Exercises Exercises: Other exercises Other Exercises Other Exercises: linked L and R hands at midline (interwoven fingers) and lifted together to 60 degrees x10    Shoulder Instructions       General Comments VSS on RA    Pertinent Vitals/ Pain       Pain Assessment Pain Assessment: 0-10 Pain Score: 3  Pain Location: BLE Pain Descriptors / Indicators: Discomfort, Grimacing Pain Intervention(s): Monitored during session, Repositioned  Home Living                                          Prior Functioning/Environment              Frequency  Min 2X/week         Progress Toward Goals  OT Goals(current goals can now be found in the care plan section)  Progress towards OT goals: Progressing toward goals  Acute Rehab OT Goals Patient Stated Goal: get back to PLOF OT Goal Formulation: With patient Time For Goal Achievement: 09/12/22 Potential to Achieve Goals: Good ADL Goals Pt Will Perform Grooming: with mod assist;sitting Pt Will Perform Upper Body Bathing: with mod assist;sitting Additional ADL Goal #1: pt will locate object in L visual field 2 out 3 attempts with R UE Additional ADL Goal #2: pt will demonstrate static sitting balance Mod (A) as precursor to alds.  Plan Discharge plan remains appropriate    Co-evaluation    PT/OT/SLP Co-Evaluation/Treatment: Yes Reason for Co-Treatment: For patient/therapist safety;To address functional/ADL transfers PT goals addressed during session: Mobility/safety with mobility;Balance;Strengthening/ROM OT goals addressed during session: ADL's and self-care;Strengthening/ROM      AM-PAC OT "6 Clicks" Daily Activity     Outcome Measure   Help from another person eating meals?: A Lot Help from another person taking care of personal grooming?: A  Lot Help from another person toileting, which includes using toliet, bedpan, or urinal?: Total Help from another person bathing (including washing, rinsing, drying)?: Total Help from another person to put on and taking off regular upper body clothing?: A Lot Help from another person to put on and taking off regular lower body clothing?: Total 6 Click Score: 9    End of Session    OT Visit Diagnosis: Unsteadiness on feet (R26.81);Muscle weakness (generalized) (M62.81)   Activity Tolerance Patient tolerated treatment well   Patient Left in chair;with chair alarm set;with call bell/phone within reach;Other (comment) (lift pad placed underneath)   Nurse Communication Mobility status;Need for lift equipment (no amputee lift pads - cross and keep  more supine during transfer)        Time: 7494-4967 OT Time Calculation (min): 40 min  Charges: OT General Charges $OT Visit: 1 Visit OT Treatments $Self Care/Home Management : 8-22 mins  Jesse Sans OTR/L Acute Rehabilitation Services Office: [place] 09/07/2022, 12:19 PM

## 2022-09-07 NOTE — Progress Notes (Signed)
   09/07/22 0155  Assess: MEWS Score  BP 133/69  MAP (mmHg) 86  Pulse Rate 78  ECG Heart Rate 78  Resp 20  SpO2 93 %  Assess: MEWS Score  MEWS Temp 0  MEWS Systolic 0  MEWS Pulse 0  MEWS RR 0  MEWS LOC 0  MEWS Score 0  MEWS Score Color Green  Provider Notification  Provider Name/Title Abigail Butts  Date Provider Notified 09/07/22  Time Provider Notified 0201  Method of Notification Page (secure chat)  Notification Reason New onset of dysrhythmia  Provider response Evaluate remotely  Assess: SIRS CRITERIA  SIRS Temperature  0  SIRS Pulse 0  SIRS Respirations  0  SIRS WBC 0  SIRS Score Sum  0   Dysrhythmia not sustained. Pt asymptomatic. RN will continue to monitor and reassess.

## 2022-09-07 NOTE — Progress Notes (Signed)
Speech Language Pathology Treatment: Dysphagia;Cognitive-Linquistic  Patient Details Name: Mercedes [name] MRN: [id number] DOB: September 13, 1955 Today's Date: 09/07/2022 Time: 0900-0930 SLP Time Calculation (min) (ACUTE ONLY): 30 min  Assessment / Plan / Recommendation Clinical Impression  Pt shows determination to continue eating and drinking independently. RN reports trouble masticating solids, and pt reports fear of choking on Dys 2 diet. Pt consumed purees and honey thick liquids with no overt s/sx of aspiration noted during limited trials. Min verbal cues given by SLP. Diet downgraded to Dys 1 to address pt concerns and try to facilitate adequate nutrition. Pt shows signs of emerging problem-solving skills to compensate for L side weakness and is able to do some self-feed. Mod cues were needed for awareness and sustained attention to task. Further education and tx is necessary to address awareness of poor labial seal, anterior spillage, and overall oral control.   HPI HPI: 67 yo F presenting 08/28/22 with L sided weakness, not speaking, R gaze preference. 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 Pt was evaluated by SLP in March 2023 and found to have mild-moderate dysarthria; cognitive-linguistic function and swallowing WFL. PMH includes: L frontal CVA, GERD, sjogren's syndrome, B AKA, takotsubo CM, SLE, RA, PAD, MI, HTN, CAD, anxiety, depression, chiari malformation. MBS 1/9 revealed moderate risk of dyspagia due to oropharyngeal dyspagia characterized by poor coordination for swallow and facial weakness causing anterior loss.      SLP Plan  Continue with current plan of care      Recommendations for follow up therapy are one component of a multi-disciplinary discharge planning process, led by the attending physician.  Recommendations may be updated based on patient status, additional functional criteria and insurance authorization.    Recommendations  Diet recommendations: Dysphagia 1  (puree);Honey-thick liquid Medication Administration: Crushed with puree Supervision: Staff to assist with self feeding Compensations: Slow rate;Small sips/bites;Monitor for anterior loss;Lingual sweep for clearance of pocketing Postural Changes and/or Swallow Maneuvers: Seated upright 90 degrees                Oral Care Recommendations: Oral care BID Follow Up Recommendations: Skilled nursing-short term rehab (<3 hours/day) SLP Visit Diagnosis: Dysphagia, oropharyngeal phase (R13.12) Plan: Continue with current plan of care        Fabio Asa, Student SLP  09/07/2022, 1:45 PM

## 2022-09-07 NOTE — Progress Notes (Signed)
Physical Therapy Treatment Patient Details Name: Mercedes [name] MRN: [id number] DOB: [birth year] Today's Date: 09/07/2022   History of Present Illness 67 yo s/p 08/28/22 R ICA occlusion with thrombectomy NIH 19 PMH L frontal CVA B AKA, takotsubo CM, SLE, RA, PAD, MI, HTN, CAD.    PT Comments    Patient progressing slowly.  Still needing significant assistance to scoot and to activate L side.  Able to assist some with facilitating at trunk today.  Sitting balance also limited needing help due to posterior lean and L lateral lean.  Patient remains appropriate for SNF.  PT will continue to follow.    Recommendations for follow up therapy are one component of a multi-disciplinary discharge planning process, led by the attending physician.  Recommendations may be updated based on patient status, additional functional criteria and insurance authorization.  Follow Up Recommendations  Skilled nursing-short term rehab (<3 hours/day) Can patient physically be transported by private vehicle: No   Assistance Recommended at Discharge Frequent or constant Supervision/Assistance  Patient can return home with the following Assist for transportation;Two people to help with bathing/dressing/bathroom;Two people to help with walking and/or transfers;Help with stairs or ramp for entrance   Equipment Recommendations  Hospital bed;Other (comment) (hoyer lift)    Recommendations for Other Services       Precautions / Restrictions Precautions Precautions: Fall Precaution Comments: SBP 120-140 Restrictions Weight Bearing Restrictions: No Other Position/Activity Restrictions: h/o B AKA     Mobility  Bed Mobility Overal bed mobility: Needs Assistance Bed Mobility: Supine to Sit, Rolling, Sit to Supine Rolling: Mod assist, +2 for safety/equipment   Supine to sit: Mod assist, HOB elevated Sit to supine: Max assist, +2 for physical assistance   General bed mobility comments: multimodal cues for  sequencing, hand placement, trunk assist/facilitation on L for reciprocal scooting    Transfers Overall transfer level: Needs assistance Equipment used: None Transfers: Bed to chair/wheelchair/BSC         Anterior-Posterior transfers: Max assist, +2 physical assistance, +2 safety/equipment   General transfer comment: pt utilizes R side to assist in maintaining balance vs pushing to scoot, maxA to slide posteriorly as pt has a difficult time sliding L hip due to weakness from CVA, multimodal cues for sequencing and leaning for AP transfer    Ambulation/Gait                   Stairs             Wheelchair Mobility    Modified Rankin (Stroke Patients Only) Modified Rankin (Stroke Patients Only) Pre-Morbid Rankin Score: Moderately severe disability Modified Rankin: Severe disability     Balance Overall balance assessment: Needs assistance Sitting-balance support: Single extremity supported, Feet supported Sitting balance-Leahy Scale: Poor Sitting balance - Comments: modA, posterior or left lateral lean Postural control: Posterior lean, Left lateral lean                                  Cognition Arousal/Alertness: Awake/alert Behavior During Therapy: WFL for tasks assessed/performed Overall Cognitive Status: Within Functional Limits for tasks assessed                                 General Comments: limited vocalization during session but volume of speech is improved when communicating verbally today        Exercises Amputee Exercises Hip  Flexion/Marching: AROM, AAROM, 10 reps, Both, Supine (pad under L LE for pt to self assist to lift)    General Comments General comments (skin integrity, edema, etc.): patient's prosthetics in the room but no sleeves for donning, called daughter (no answer and generic voicemail so no message left), called sister and left voicemail.  Family arrived after session so OT asked family to bring in  liners.  Noted purewick dislodged so assisted to change gown and replace bed pad, pt urinated onto bed pad as well and particiated in perineal hygiene while in supine with set up.      Pertinent Vitals/Pain Pain Assessment Faces Pain Scale: Hurts a little bit Pain Location: BLE Pain Descriptors / Indicators: Discomfort, Grimacing Pain Intervention(s): Monitored during session, Repositioned    Home Living                          Prior Function            PT Goals (current goals can now be found in the care plan section) Progress towards PT goals: Progressing toward goals    Frequency    Min 3X/week      PT Plan Current plan remains appropriate    Co-evaluation PT/OT/SLP Co-Evaluation/Treatment: Yes Reason for Co-Treatment: For patient/therapist safety;To address functional/ADL transfers PT goals addressed during session: Mobility/safety with mobility;Balance;Strengthening/ROM OT goals addressed during session: ADL's and self-care;Strengthening/ROM      AM-PAC PT "6 Clicks" Mobility   Outcome Measure  Help needed turning from your back to your side while in a flat bed without using bedrails?: A Lot Help needed moving from lying on your back to sitting on the side of a flat bed without using bedrails?: A Lot Help needed moving to and from a bed to a chair (including a wheelchair)?: Total Help needed standing up from a chair using your arms (e.g., wheelchair or bedside chair)?: Total Help needed to walk in hospital room?: Total Help needed climbing 3-5 steps with a railing? : Total 6 Click Score: 8    End of Session   Activity Tolerance: Patient tolerated treatment well Patient left: in chair;with call bell/phone within reach;with chair alarm set Nurse Communication: Need for lift equipment;Mobility status PT Visit Diagnosis: Other abnormalities of gait and mobility (R26.89);Hemiplegia and hemiparesis Hemiplegia - Right/Left: Left Hemiplegia -  dominant/non-dominant: Non-dominant Hemiplegia - caused by: Cerebral infarction     Time: 1749-4496 PT Time Calculation (min) (ACUTE ONLY): 40 min  Charges:  $Therapeutic Activity: 8-22 mins $Self Care/Home Management: 8-22                     Magda Kiel, PT Acute Rehabilitation Services Office:[phone] 09/07/2022    Mercedes [name] 09/07/2022, 1:47 PM

## 2022-09-08 DIAGNOSIS — I63231 Cerebral infarction due to unspecified occlusion or stenosis of right carotid arteries: Secondary | ICD-10-CM | POA: Diagnosis not present

## 2022-09-08 LAB — BASIC METABOLIC PANEL
Anion gap: 10 (ref 5–15)
BUN: 24 mg/dL — ABNORMAL HIGH (ref 8–23)
CO2: 25 mmol/L (ref 22–32)
Calcium: 8.5 mg/dL — ABNORMAL LOW (ref 8.9–10.3)
Chloride: 99 mmol/L (ref 98–111)
Creatinine, Ser: 0.75 mg/dL (ref 0.44–1.00)
GFR, Estimated: >60 mL/min (ref >60)
Glucose, Bld: 85 mg/dL (ref 70–99)
Potassium: 4.2 mmol/L (ref 3.5–5.1)
Sodium: 134 mmol/L — ABNORMAL LOW (ref 135–145)

## 2022-09-08 LAB — GLUCOSE, CAPILLARY
Glucose-Capillary: 160 mg/dL — ABNORMAL HIGH (ref 70–99)
Glucose-Capillary: 93 mg/dL (ref 70–99)
Glucose-Capillary: 122 mg/dL — ABNORMAL HIGH (ref 70–99)
Glucose-Capillary: 131 mg/dL — ABNORMAL HIGH (ref 70–99)
Glucose-Capillary: 136 mg/dL — ABNORMAL HIGH (ref 70–99)

## 2022-09-08 LAB — CBC
HCT: 30.8 % — ABNORMAL LOW (ref 36.0–46.0)
Hemoglobin: 9.9 g/dL — ABNORMAL LOW (ref 12.0–15.0)
MCH: 23.7 pg — ABNORMAL LOW (ref 26.0–34.0)
MCHC: 32.1 g/dL (ref 30.0–36.0)
MCV: 73.7 fL — ABNORMAL LOW (ref 80.0–100.0)
Platelets: 570 10*3/uL — ABNORMAL HIGH (ref 150–400)
RBC: 4.18 MIL/uL (ref 3.87–5.11)
RDW: 16.8 % — ABNORMAL HIGH (ref 11.5–15.5)
WBC: 19.5 10*3/uL — ABNORMAL HIGH (ref 4.0–10.5)
nRBC: 0 % (ref 0.0–0.2)

## 2022-09-08 NOTE — Progress Notes (Addendum)
STROKE TEAM PROGRESS NOTE   INTERVAL HISTORY Patient seen lying in her bed.  No family at bedside.  Her vital signs have been stable, and her neurological exam is unchanged.  Her prosthetic legs have been brought in from home, so she can ambulate more.  If she is eating more than 50% of her meals we can d/c cortrak  Vitals:   09/08/22 0307 09/08/22 0347 09/08/22 0801 09/08/22 1148  BP: (!) 141/72  124/64 132/71  Pulse: 90  71 69  Resp: 20  18 17   Temp: 99.8 F (37.7 C)  99.4 F (37.4 C) 97.9 F (36.6 C)  TempSrc: Oral  Oral Oral  SpO2: 98%  100% 100%  Weight:  45 kg     CBC:  Recent Labs  Lab 09/07/22 0253 09/08/22 0618  WBC 8.3 19.5*  HGB 9.9* 9.9*  HCT 32.3* 30.8*  MCV 76.0* 73.7*  PLT 544* 570*    Basic Metabolic Panel:  Recent Labs  Lab 09/07/22 0253 09/08/22 0618  NA 135 134*  K 4.3 4.2  CL 101 99  CO2 25 25  GLUCOSE 119* 85  BUN 33* 24*  CREATININE 0.76 0.75  CALCIUM 8.8* 8.5*    Lipid Panel:  No results for input(s): "CHOL", "TRIG", "HDL", "CHOLHDL", "VLDL", "LDLCALC" in the last 168 hours.  HgbA1c: No results for input(s): "HGBA1C" in the last 168 hours. Urine Drug Screen: No results for input(s): "LABOPIA", "COCAINSCRNUR", "LABBENZ", "AMPHETMU", "THCU", "LABBARB" in the last 168 hours.  Alcohol Level  No results for input(s): "ETH" in the last 168 hours.   IMAGING past 24 hours No results found.  Temp:  [97.9 F (36.6 C)-99.8 F (37.7 C)] 97.9 F (36.6 C) (01/12 1148) Pulse Rate:  [69-94] 69 (01/12 1148) Resp:  [17-20] 17 (01/12 1148) BP: (124-167)/(64-73) 132/71 (01/12 1148) SpO2:  [94 %-100 %] 100 % (01/12 1148) Weight:  [45 kg] 45 kg (01/12 0347)  General - Well nourished, well developed, in no apparent distress. HEENT- atraumatic, normocephalic  Cardiovascular - Regular rhythm and rate. Pulmonary- equal chest rise; unlabored respirations.  Extremities- BLE AKA  Neuro: Mental Status -  Alert and awake.  Orientation to time,  place and person intact.   Language intact. Speech moderate dysarthria. Attention span and concentration were normal.  Right upper and right lower extremities are normal strength and movement-5/5.  Left upper and lower extremities with 2 out of 5 strength She has left facial droop.  Sensation is decreased on the left. Visual fields are intact. Her extraocular movements are intact. Right gaze preference but crosses midline. She follows commands. Gait is not observed as noted above she has a bilateral AKA.   ASSESSMENT/PLAN [name] is a 67 y.o. female with history of CAD, GERD, CVA in March of 2023, HTN, HLD, PAD with bilateral AKA, AF on Eliquis, AICD, Sjogren's syndrome, and cardiomyopathy presenting with right gaze deviation and left-sided weakness. LKW 08/28/22 15:45.  Stroke: Acute right hemispheric infarct due to acute right ICA terminus occlusion.  Etiology is likely embolic secondary to atrial fibrillation s/p IR with TICI3 Code Stroke right M1 segment concerning for acute thrombus.  Slight asymmetric hypoattenuation in the right lentiform nucleus and insular ribbon.  This could represent early infarct. ASPECTS 8/10.   CTA head & neck occlusion of the right ICA in its distal segment without reconstitution prior to the skull base.  The right ICA is occluded through the ICA terminus.  Right M1 and A1 segments are  occluded.  Minimal filling of distal right MCA branch vessels with some collaterals.   IR with ICA, MCA, and ACA achieving complete recanalization.   CT 1/2 right MCA infarct with petechial hemorrhage, interhemispheric SAH CT 1/5 decreased interhemispheric SAH, right BG infarct with petechial hemorrhage, stable. CT 1/11 stable right hemispheric infarct with no hemorrhagic transformation, virtually resolved subarachnoid hemorrhage MRI unable to obtain due to PPM. 2D Echo EF 55 to 60% LDL 82 HgbA1c 6.6 VTE prophylaxis -fully anticoagulated with Eliquis Eliquis  (apixaban) daily prior to admission, now on Eliquis Therapy recommendations: SNF Disposition: Pending  History of CVA and seizure 10/2014 admitted for focal seizure.  Patient not on AED. 10/2021 presenting with speech disturbance and right upper extremity weakness.  CT showed left large subcortical infarct.  CTA head and neck left ICA 60% stenosis.  Carotid Doppler left ICA 40 to 59% stenosis.  EF 55 to 60%.  LDL 86, A1c 6.2. It was felt that her stroke was due to PAF with Eliquis nonadherence as patient stopped taking AC as she perceived "it was not doing anything for me". Discharged on Eliquis and Crestor 40. Patient had residual dysarthria and improving RUE weakness  Dysphagia MBS- done 1/9- dysphagia 2 with honey thick liquids Cortrak in place- can remove if she is eating more than 50% of her meals On nocturnal feeding to facilitate po intake  Paroxysmal atrial fibrillation Previously noncompliant with Eliquis in 10/2021 Patient endorses current compliance with Eliquis  Repeat head CT looks good with virtual resolution of subarachnoid hemorrhage Resume Eliquis  Hypertension Home meds: Lopressor Stable  On Norvasc 10  long-term BP goal normotensive  Hyperlipidemia Home meds: Crestor 40 mg, resumed in hospital LDL 82, goal < 70 Continue statin at discharge  PAD status post bilateral AKA Right leg pain Right leg warm, no sign of ischemic limb  pain likely due to PAD versus lying in the bed without being up and about with her prosthesis as she usually is. Continue gabapentin 100->200 tid Tylenol and oxycodone as needed  Other Stroke Risk Factors Advanced Age >/= 32  Former cigarette smoker Coronary artery disease  Other Active Problems SSS status post pacemaker Sjogren syndrome Constipation - bowel regimen - fleet enema today  Hospital day # 11  Patient seen and examined by NP/APP with MD. MD to update note as needed.   Janine Ores, DNP, FNP-BC [facility] Pager: [phone]  ATTENDING NOTE: I reviewed above note and agree with the assessment and plan. Pt was seen and examined.   No family bedside.  Patient sitting bed for breakfast.  Seems more appetite than before.  Recommend to remove core track if patient can eat each meal more than 50%.  Continue Eliquis and statin.  Pending SNF.  For detailed assessment and plan, please refer to above/below as I have made changes wherever appropriate.   Rosalin Hawking, MD PhD Stroke Neurology 09/08/2022 7:11 PM     To contact Stroke Continuity provider, please refer to [email or web address] After hours, contact General Neurology

## 2022-09-08 NOTE — Progress Notes (Signed)
PT Note  Spoke with daughter, Meka via phone who reports her nephew thinks he recalled seeing pt's prosthetic liners.  She states they will try to have them here by Monday.   Magda Kiel, PT Acute Rehabilitation Services [phone] 09/08/2022

## 2022-09-09 DIAGNOSIS — I63231 Cerebral infarction due to unspecified occlusion or stenosis of right carotid arteries: Secondary | ICD-10-CM | POA: Diagnosis not present

## 2022-09-09 LAB — BASIC METABOLIC PANEL
Anion gap: 8 (ref 5–15)
BUN: 19 mg/dL (ref 8–23)
CO2: 25 mmol/L (ref 22–32)
Calcium: 8.6 mg/dL — ABNORMAL LOW (ref 8.9–10.3)
Chloride: 100 mmol/L (ref 98–111)
Creatinine, Ser: 0.66 mg/dL (ref 0.44–1.00)
GFR, Estimated: >60 mL/min (ref >60)
Glucose, Bld: 122 mg/dL — ABNORMAL HIGH (ref 70–99)
Potassium: 4 mmol/L (ref 3.5–5.1)
Sodium: 133 mmol/L — ABNORMAL LOW (ref 135–145)

## 2022-09-09 LAB — GLUCOSE, CAPILLARY
Glucose-Capillary: 115 mg/dL — ABNORMAL HIGH (ref 70–99)
Glucose-Capillary: 122 mg/dL — ABNORMAL HIGH (ref 70–99)
Glucose-Capillary: 114 mg/dL — ABNORMAL HIGH (ref 70–99)
Glucose-Capillary: 124 mg/dL — ABNORMAL HIGH (ref 70–99)
Glucose-Capillary: 109 mg/dL — ABNORMAL HIGH (ref 70–99)
Glucose-Capillary: 142 mg/dL — ABNORMAL HIGH (ref 70–99)
Glucose-Capillary: 105 mg/dL — ABNORMAL HIGH (ref 70–99)

## 2022-09-09 LAB — CBC
HCT: 31.2 % — ABNORMAL LOW (ref 36.0–46.0)
Hemoglobin: 9.8 g/dL — ABNORMAL LOW (ref 12.0–15.0)
MCH: 23.7 pg — ABNORMAL LOW (ref 26.0–34.0)
MCHC: 31.4 g/dL (ref 30.0–36.0)
MCV: 75.5 fL — ABNORMAL LOW (ref 80.0–100.0)
Platelets: 558 10*3/uL — ABNORMAL HIGH (ref 150–400)
RBC: 4.13 MIL/uL (ref 3.87–5.11)
RDW: 17.1 % — ABNORMAL HIGH (ref 11.5–15.5)
WBC: 14.3 10*3/uL — ABNORMAL HIGH (ref 4.0–10.5)
nRBC: 0 % (ref 0.0–0.2)

## 2022-09-09 NOTE — Progress Notes (Signed)
STROKE TEAM PROGRESS NOTE   INTERVAL HISTORY Patient seen lying in her bed.  No family at bedside.  Her vital signs have been stable, and her neurological exam is unchanged.  Her prosthetic legs have been brought in from home, so she can ambulate more.  She has not not been eating a lot of the food and says that she does not like the diet.  Physical therapy plans to ambulate her using her prosthetic legs today.  Vital signs stable.  Neurological exam unchanged.  Vitals:   09/09/22 0302 09/09/22 0805 09/09/22 1206 09/09/22 1522  BP: 126/74 131/67 124/67 135/68  Pulse: 83 71 67 93  Resp: 18 17 19 13   Temp: 98.7 F (37.1 C) 98.8 F (37.1 C) 98.7 F (37.1 C) 98.4 F (36.9 C)  TempSrc: Oral Oral Oral Oral  SpO2: 97% 100% 100% 95%  Weight:       CBC:  Recent Labs  Lab 09/08/22 0618 09/09/22 0315  WBC 19.5* 14.3*  HGB 9.9* 9.8*  HCT 30.8* 31.2*  MCV 73.7* 75.5*  PLT 570* 161*   Basic Metabolic Panel:  Recent Labs  Lab 09/08/22 0618 09/09/22 0315  NA 134* 133*  K 4.2 4.0  CL 99 100  CO2 25 25  GLUCOSE 85 122*  BUN 24* 19  CREATININE 0.75 0.66  CALCIUM 8.5* 8.6*   Lipid Panel:  No results for input(s): "CHOL", "TRIG", "HDL", "CHOLHDL", "VLDL", "LDLCALC" in the last 168 hours.  HgbA1c: No results for input(s): "HGBA1C" in the last 168 hours. Urine Drug Screen: No results for input(s): "LABOPIA", "COCAINSCRNUR", "LABBENZ", "AMPHETMU", "THCU", "LABBARB" in the last 168 hours.  Alcohol Level  No results for input(s): "ETH" in the last 168 hours.   IMAGING past 24 hours No results found.  Temp:  [97.8 F (36.6 C)-98.8 F (37.1 C)] 98.4 F (36.9 C) (01/13 1522) Pulse Rate:  [67-93] 93 (01/13 1522) Resp:  [13-20] 13 (01/13 1522) BP: (124-135)/(67-74) 135/68 (01/13 1522) SpO2:  [95 %-100 %] 95 % (01/13 1522)  General -frail elderly Caucasian lady, in no apparent distress. HEENT- atraumatic, normocephalic  Cardiovascular - Regular rhythm and rate. Pulmonary- equal  chest rise; unlabored respirations.  Extremities- BLE AKA  Neuro: Mental Status -  Alert and awake.  Orientation to time, place and person intact.   Language intact. Speech moderate dysarthria. Attention span and concentration were normal.  Right upper and right lower extremities are normal strength and movement-5/5.  Left upper and lower extremities with 2 out of 5 strength She has left facial droop.  Sensation is decreased on the left. Visual fields are intact. Her extraocular movements are intact. Right gaze preference but crosses midline. She follows commands. Gait is not observed as noted above she has a bilateral AKA.   ASSESSMENT/PLAN Ms. HETVI SHAWHAN is a 67 y.o. female with history of CAD, GERD, CVA in March of 2023, HTN, HLD, PAD with bilateral AKA, AF on Eliquis, AICD, Sjogren's syndrome, and cardiomyopathy presenting with right gaze deviation and left-sided weakness. LKW 08/28/22 15:45.  Stroke: Acute right hemispheric infarct due to acute right ICA terminus occlusion.  Etiology is likely embolic secondary to atrial fibrillation s/p IR with TICI3 Code Stroke right M1 segment concerning for acute thrombus.  Slight asymmetric hypoattenuation in the right lentiform nucleus and insular ribbon.  This could represent early infarct. ASPECTS 8/10.   CTA head & neck occlusion of the right ICA in its distal segment without reconstitution prior to the skull base.  The right ICA is occluded through the ICA terminus.  Right M1 and A1 segments are occluded.  Minimal filling of distal right MCA branch vessels with some collaterals.   IR with ICA, MCA, and ACA achieving complete recanalization.   CT 1/2 right MCA infarct with petechial hemorrhage, interhemispheric SAH CT 1/5 decreased interhemispheric SAH, right BG infarct with petechial hemorrhage, stable. CT 1/11 stable right hemispheric infarct with no hemorrhagic transformation, virtually resolved subarachnoid hemorrhage MRI unable to  obtain due to PPM. 2D Echo EF 55 to 60% LDL 82 HgbA1c 6.6 VTE prophylaxis -fully anticoagulated with Eliquis Eliquis (apixaban) daily prior to admission, now fully anticoagulated with Eliquis Therapy recommendations: SNF Disposition: Pending  History of CVA and seizure 10/2014 admitted for focal seizure.  Patient not on AED. 10/2021 presenting with speech disturbance and right upper extremity weakness.  CT showed left large subcortical infarct.  CTA head and neck left ICA 60% stenosis.  Carotid Doppler left ICA 40 to 59% stenosis.  EF 55 to 60%.  LDL 86, A1c 6.2. It was felt that her stroke was due to PAF with Eliquis nonadherence as patient stopped taking AC as she perceived "it was not doing anything for me". Discharged on Eliquis and Crestor 40. Patient had residual dysarthria and improving RUE weakness  Dysphagia MBS- done 1/9- dysphagia 2 with honey thick liquids Cortrak in place with TF On nocturnal feeding to facilitate po intake  Paroxysmal atrial fibrillation Previously noncompliant with CVA in 10/2021 Patient endorses current compliance with Eliquis  Repeat head CT looks good with virtual resolution of subarachnoid hemorrhage, will restart Eliquis  Hypertension Home meds: Lopressor Stable  On Norvasc 10  long-term BP goal normotensive  Hyperlipidemia Home meds: Crestor 40 mg, resumed in hospital LDL 82, goal < 70 Continue statin at discharge  PAD status post bilateral AKA Right leg pain Right leg warm, no sign of ischemic limb  pain likely due to PAD versus lying in the bed without being up and about with her prosthesis as she usually is. Continue gabapentin 100->200 tid Tylenol and oxycodone as needed  Other Stroke Risk Factors Advanced Age >/= 21  Former cigarette smoker Coronary artery disease  Other Active Problems SSS status post pacemaker Sjogren syndrome Constipation - bowel regimen - fleet enema today  Hospital day # 12  Patient was counseled to be  compliant with her diet and eat so that we can discontinue the core track tube in the next few days.  Continue ongoing therapies.  Medically stable to be transferred to rehabilitation when bed available. Antony Contras, MD 09/09/2022 4:34 PM    To contact Stroke Continuity provider, please refer to [email or web address] After hours, contact General Neurology

## 2022-09-10 DIAGNOSIS — I63231 Cerebral infarction due to unspecified occlusion or stenosis of right carotid arteries: Secondary | ICD-10-CM | POA: Diagnosis not present

## 2022-09-10 LAB — CBC
HCT: 33.3 % — ABNORMAL LOW (ref 36.0–46.0)
Hemoglobin: 10.2 g/dL — ABNORMAL LOW (ref 12.0–15.0)
MCH: 23.2 pg — ABNORMAL LOW (ref 26.0–34.0)
MCHC: 30.6 g/dL (ref 30.0–36.0)
MCV: 75.7 fL — ABNORMAL LOW (ref 80.0–100.0)
Platelets: 577 10*3/uL — ABNORMAL HIGH (ref 150–400)
RBC: 4.4 MIL/uL (ref 3.87–5.11)
RDW: 17.1 % — ABNORMAL HIGH (ref 11.5–15.5)
WBC: 8.5 10*3/uL (ref 4.0–10.5)
nRBC: 0 % (ref 0.0–0.2)

## 2022-09-10 LAB — BASIC METABOLIC PANEL
Anion gap: 9 (ref 5–15)
BUN: 26 mg/dL — ABNORMAL HIGH (ref 8–23)
CO2: 22 mmol/L (ref 22–32)
Calcium: 8.7 mg/dL — ABNORMAL LOW (ref 8.9–10.3)
Chloride: 104 mmol/L (ref 98–111)
Creatinine, Ser: 0.69 mg/dL (ref 0.44–1.00)
GFR, Estimated: >60 mL/min (ref >60)
Glucose, Bld: 135 mg/dL — ABNORMAL HIGH (ref 70–99)
Potassium: 4.3 mmol/L (ref 3.5–5.1)
Sodium: 135 mmol/L (ref 135–145)

## 2022-09-10 LAB — GLUCOSE, CAPILLARY
Glucose-Capillary: 123 mg/dL — ABNORMAL HIGH (ref 70–99)
Glucose-Capillary: 109 mg/dL — ABNORMAL HIGH (ref 70–99)
Glucose-Capillary: 109 mg/dL — ABNORMAL HIGH (ref 70–99)
Glucose-Capillary: 125 mg/dL — ABNORMAL HIGH (ref 70–99)
Glucose-Capillary: 129 mg/dL — ABNORMAL HIGH (ref 70–99)
Glucose-Capillary: 151 mg/dL — ABNORMAL HIGH (ref 70–99)

## 2022-09-10 NOTE — Progress Notes (Signed)
STROKE TEAM PROGRESS NOTE   INTERVAL HISTORY Patient seen sitting up in her bed.  No family at bedside.  Her vital signs have been stable, and her neurological exam is unchanged.  Her prosthetic legs have been brought in from home, so she can ambulate more.  She states that she has been eating more since yesterday.  Physical therapy plans to ambulate her using her prosthetic legs today.  Vital signs stable.  Neurological exam unchanged.  Vitals:   09/10/22 0322 09/10/22 0723 09/10/22 1152 09/10/22 1229  BP:  123/70 (!) 102/58 117/66  Pulse:  68 64 63  Resp:  18 16 15   Temp: 98.1 F (36.7 C) 98.6 F (37 C) 98.7 F (37.1 C) 98.5 F (36.9 C)  TempSrc: Oral Oral Oral Oral  SpO2:  94% 100% 100%  Weight:       CBC:  Recent Labs  Lab 09/09/22 0315 09/10/22 0411  WBC 14.3* 8.5  HGB 9.8* 10.2*  HCT 31.2* 33.3*  MCV 75.5* 75.7*  PLT 558* 409*   Basic Metabolic Panel:  Recent Labs  Lab 09/09/22 0315 09/10/22 0411  NA 133* 135  K 4.0 4.3  CL 100 104  CO2 25 22  GLUCOSE 122* 135*  BUN 19 26*  CREATININE 0.66 0.69  CALCIUM 8.6* 8.7*   Lipid Panel:  No results for input(s): "CHOL", "TRIG", "HDL", "CHOLHDL", "VLDL", "LDLCALC" in the last 168 hours.  HgbA1c: No results for input(s): "HGBA1C" in the last 168 hours. Urine Drug Screen: No results for input(s): "LABOPIA", "COCAINSCRNUR", "LABBENZ", "AMPHETMU", "THCU", "LABBARB" in the last 168 hours.  Alcohol Level  No results for input(s): "ETH" in the last 168 hours.   IMAGING past 24 hours No results found.  Temp:  [98.1 F (36.7 C)-98.7 F (37.1 C)] 98.5 F (36.9 C) (01/14 1229) Pulse Rate:  [63-93] 63 (01/14 1229) Resp:  [13-18] 15 (01/14 1229) BP: (102-136)/(58-72) 117/66 (01/14 1229) SpO2:  [94 %-100 %] 100 % (01/14 1229)  General -frail elderly Caucasian lady, in no apparent distress. HEENT- atraumatic, normocephalic  Cardiovascular - Regular rhythm and rate. Pulmonary- equal chest rise; unlabored respirations.   Extremities- BLE AKA  Neuro: Mental Status -  Alert and awake.  Orientation to time, place and person intact.   Language intact. Speech moderate dysarthria. Attention span and concentration were normal.  Right upper and right lower extremities are normal strength and movement-5/5.  Left upper and lower extremities with 2 out of 5 strength She has left facial droop.  Sensation is decreased on the left. Visual fields are intact. Her extraocular movements are intact. Right gaze preference but crosses midline. She follows commands. Gait is not observed as noted above she has a bilateral AKA.   ASSESSMENT/PLAN Mercedes [name] is a 67 y.o. female with history of CAD, GERD, CVA in March of 2023, HTN, HLD, PAD with bilateral AKA, AF on Eliquis, AICD, Sjogren's syndrome, and cardiomyopathy presenting with right gaze deviation and left-sided weakness. LKW 08/28/22 15:45.  Stroke: Acute right hemispheric infarct due to acute right ICA terminus occlusion.  Etiology is likely embolic secondary to atrial fibrillation s/p IR with TICI3 Code Stroke right M1 segment concerning for acute thrombus.  Slight asymmetric hypoattenuation in the right lentiform nucleus and insular ribbon.  This could represent early infarct. ASPECTS 8/10.   CTA head & neck occlusion of the right ICA in its distal segment without reconstitution prior to the skull base.  The right ICA is occluded through the ICA  terminus.  Right M1 and A1 segments are occluded.  Minimal filling of distal right MCA branch vessels with some collaterals.   IR with ICA, MCA, and ACA achieving complete recanalization.   CT 1/2 right MCA infarct with petechial hemorrhage, interhemispheric SAH CT 1/5 decreased interhemispheric SAH, right BG infarct with petechial hemorrhage, stable. CT 1/11 stable right hemispheric infarct with no hemorrhagic transformation, virtually resolved subarachnoid hemorrhage MRI unable to obtain due to PPM. 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  Patient not on AED. 10/2021 presenting with speech disturbance and right upper extremity weakness.  CT showed left large subcortical infarct.  CTA head and neck left ICA 60% stenosis.  Carotid Doppler left ICA 40 to 59% stenosis.  EF 55 to 60%.  LDL 86, A1c 6.2. It was felt that her stroke was due to PAF with Eliquis nonadherence as patient stopped taking AC as she perceived "it was not doing anything for me". Discharged on Eliquis and Crestor 40. Patient had residual dysarthria and improving RUE weakness  Dysphagia MBS- done 1/9- dysphagia 2 with honey thick liquids Cortrak in place with TF On nocturnal feeding to facilitate po intake  Paroxysmal atrial fibrillation Previously noncompliant with CVA in 10/2021 Patient endorses current compliance with Eliquis  Repeat head CT looks good with virtual resolution of subarachnoid hemorrhage, will restart Eliquis  Hypertension Home meds: Lopressor Stable  On Norvasc 10  long-term BP goal normotensive  Hyperlipidemia Home meds: Crestor 40 mg, resumed in hospital LDL 82, goal < 70 Continue statin at discharge  PAD status post bilateral AKA Right leg pain Right leg warm, no sign of ischemic limb  pain likely due to PAD versus lying in the bed without being up and about with her prosthesis as she usually is. Continue gabapentin 100->200 tid Tylenol and oxycodone as needed  Other Stroke Risk Factors Advanced Age >/= 61  Former cigarette smoker Coronary artery disease  Other Active Problems SSS status post pacemaker Sjogren syndrome Constipation - bowel regimen - fleet enema today  Hospital day # 13  Patient was counseled to be compliant with her diet and eat so  that we can discontinue the core track tube in the next few days.  Continue ongoing therapies.  Medically stable to be transferred to rehabilitation when bed available. Mercedes Contras, MD 09/10/2022 1:55 PM    To contact Stroke Continuity provider, please refer to [email or web address] After hours, contact General Neurology

## 2022-09-11 DIAGNOSIS — I63231 Cerebral infarction due to unspecified occlusion or stenosis of right carotid arteries: Secondary | ICD-10-CM | POA: Diagnosis not present

## 2022-09-11 LAB — BASIC METABOLIC PANEL
Anion gap: 11 (ref 5–15)
BUN: 30 mg/dL — ABNORMAL HIGH (ref 8–23)
CO2: 22 mmol/L (ref 22–32)
Calcium: 8.7 mg/dL — ABNORMAL LOW (ref 8.9–10.3)
Chloride: 100 mmol/L (ref 98–111)
Creatinine, Ser: 0.7 mg/dL (ref 0.44–1.00)
GFR, Estimated: >60 mL/min (ref >60)
Glucose, Bld: 128 mg/dL — ABNORMAL HIGH (ref 70–99)
Potassium: 4.2 mmol/L (ref 3.5–5.1)
Sodium: 133 mmol/L — ABNORMAL LOW (ref 135–145)

## 2022-09-11 LAB — CBC
HCT: 31.6 % — ABNORMAL LOW (ref 36.0–46.0)
Hemoglobin: 10 g/dL — ABNORMAL LOW (ref 12.0–15.0)
MCH: 23.8 pg — ABNORMAL LOW (ref 26.0–34.0)
MCHC: 31.6 g/dL (ref 30.0–36.0)
MCV: 75.2 fL — ABNORMAL LOW (ref 80.0–100.0)
Platelets: 639 10*3/uL — ABNORMAL HIGH (ref 150–400)
RBC: 4.2 MIL/uL (ref 3.87–5.11)
RDW: 17 % — ABNORMAL HIGH (ref 11.5–15.5)
WBC: 9 10*3/uL (ref 4.0–10.5)
nRBC: 0 % (ref 0.0–0.2)

## 2022-09-11 LAB — GLUCOSE, CAPILLARY
Glucose-Capillary: 150 mg/dL — ABNORMAL HIGH (ref 70–99)
Glucose-Capillary: 112 mg/dL — ABNORMAL HIGH (ref 70–99)
Glucose-Capillary: 135 mg/dL — ABNORMAL HIGH (ref 70–99)
Glucose-Capillary: 130 mg/dL — ABNORMAL HIGH (ref 70–99)
Glucose-Capillary: 124 mg/dL — ABNORMAL HIGH (ref 70–99)

## 2022-09-11 NOTE — TOC CAGE-AID Note (Signed)
Transition of Care [facility]) - CAGE-AID Screening   Patient Details  Name: Mercedes [name] MRN: [id number] Date of Birth: Aug 31, 1955  [facility]) CM/SW Contact:    Bethann Berkshire, [place] Phone Number: 09/11/2022, 2:54 PM   Clinical Narrative:  CSW administered CAGE-AID screening; score of 0. Pt states she does not drink alcohol or use any other substances.   CAGE-AID Screening:    Have You Ever Felt You Ought to Cut Down on Your Drinking or Drug Use?: No Have People Annoyed You By Critizing Your Drinking Or Drug Use?: No Have You Felt Bad Or Guilty About Your Drinking Or Drug Use?: No Have You Ever Had a Drink or Used Drugs First Thing In The Morning to Steady Your Nerves or to Get Rid of a Hangover?: No CAGE-AID Score: 0  Substance Abuse Education Offered: No

## 2022-09-11 NOTE — Progress Notes (Signed)
Physical Therapy Treatment Patient Details Name: Mercedes [name] MRN: [id number] DOB: 06-Jul-1956 Today's Date: 09/11/2022   History of Present Illness 67 yo s/p 08/28/22 R ICA occlusion with thrombectomy NIH 19 PMH L frontal CVA B AKA, takotsubo CM, SLE, RA, PAD, MI, HTN, CAD.    PT Comments    Pt tolerates treatment well, demonstrating improved function of L digits and wrist. Pt requires reduced assistance to reach the edge of bed, but remains dependent on RUE support to maintain static sitting balance. PT assists the pt in donning sleeves for prostheses as well as RLE prosthetic, pt only tolerates wearing prosthetic for ~5 minutes due to pain. PT encourages the pt wear sleeves an hour on and an hour off today (limited to 1 hour initially due to potential for swelling after multiple weeks without wearing and due to diaphoresis). Pt reports she was not really ambulating on her prosthetics, had recently received them and was working on standing balance. At this time the pt will benefit from continued balance and transfer training in an effort to reduce caregiver burden and improve mobility quality. Standing at this time would likely be very difficult as the pt has significant weakness in L side and core. PT continues to recommend SNF placement at this time.   Recommendations for follow up therapy are one component of a multi-disciplinary discharge planning process, led by the attending physician.  Recommendations may be updated based on patient status, additional functional criteria and insurance authorization.  Follow Up Recommendations  Skilled nursing-short term rehab (<3 hours/day) Can patient physically be transported by private vehicle: No   Assistance Recommended at Discharge Frequent or constant Supervision/Assistance  Patient can return home with the following Assist for transportation;Two people to help with bathing/dressing/bathroom;Two people to help with walking and/or transfers;Help  with stairs or ramp for entrance   Equipment Recommendations  Hospital bed;Other (comment) (hoyer lift)    Recommendations for Other Services       Precautions / Restrictions Precautions Precautions: Fall Precaution Comments: SBP 120-140 Restrictions Weight Bearing Restrictions: No Other Position/Activity Restrictions: h/o B AKA     Mobility  Bed Mobility Overal bed mobility: Needs Assistance Bed Mobility: Supine to Sit, Rolling Rolling: Mod assist   Supine to sit: Mod assist, HOB elevated     General bed mobility comments: assist for sequencing, physical assist to elevate trunk from sidelying and to pivot L hip toward edge of bed    Transfers Overall transfer level: Needs assistance Equipment used: None Transfers: Bed to chair/wheelchair/BSC            Lateral/Scoot Transfers: Max assist General transfer comment: pt demonstrates improved push with RUE, better initiating scoots, continues to require significant assistance from PT to translate laterally    Ambulation/Gait                   Stairs             Wheelchair Mobility    Modified Rankin (Stroke Patients Only) Modified Rankin (Stroke Patients Only) Pre-Morbid Rankin Score: Moderately severe disability Modified Rankin: Severe disability     Balance Overall balance assessment: Needs assistance Sitting-balance support: Single extremity supported, Feet supported Sitting balance-Leahy Scale: Poor Sitting balance - Comments: minG-minA with RUE support of railing, modA without UE support Postural control: Posterior lean  Cognition Arousal/Alertness: Awake/alert Behavior During Therapy: Flat affect Overall Cognitive Status: Within Functional Limits for tasks assessed                                 General Comments: limited vocalization        Exercises General Exercises - Upper Extremity Shoulder Flexion: PROM, Left,  10 reps Elbow Flexion: PROM, Left, 10 reps Elbow Extension: PROM, Left, 10 reps Wrist Flexion: AAROM, Left, 10 reps Wrist Extension: AAROM, Left, 10 reps Digit Composite Flexion: AROM, Left, 5 reps Amputee Exercises Hip Extension: AROM, Left, 5 reps Hip ABduction/ADduction: AROM, Left, 5 reps Hip Flexion/Marching: AROM, Left, 5 reps    General Comments General comments (skin integrity, edema, etc.): PT assists pt in donning sleeves and R prosthetic, pt reports significant pain from donning R prosthetic, only tolerating for ~5 minutes. PT notes pt is very diaphoretic prior to session and during. Pt VSS during session. Recommend wearing shrinkers for an hour on and hour off initially to aide in improving fit of prostheses and due to significant sweating today      Pertinent Vitals/Pain Pain Assessment Pain Assessment: Faces Faces Pain Scale: Hurts little more Pain Location: bilateral residual limbs Pain Descriptors / Indicators: Grimacing Pain Intervention(s): Monitored during session    Home Living                          Prior Function            PT Goals (current goals can now be found in the care plan section) Acute Rehab PT Goals Patient Stated Goal: to return to transferring without assistance PT Goal Formulation: With patient Time For Goal Achievement: 09/25/22 Potential to Achieve Goals: Fair Progress towards PT goals: Progressing toward goals    Frequency    Min 3X/week      PT Plan Current plan remains appropriate    Co-evaluation              AM-PAC PT "6 Clicks" Mobility   Outcome Measure  Help needed turning from your back to your side while in a flat bed without using bedrails?: A Lot Help needed moving from lying on your back to sitting on the side of a flat bed without using bedrails?: A Lot Help needed moving to and from a bed to a chair (including a wheelchair)?: A Lot Help needed standing up from a chair using your arms (e.g.,  wheelchair or bedside chair)?: Total Help needed to walk in hospital room?: Total Help needed climbing 3-5 steps with a railing? : Total 6 Click Score: 9    End of Session   Activity Tolerance: Patient tolerated treatment well Patient left: in chair;with call bell/phone within reach;with chair alarm set Nurse Communication: Mobility status (recommend sliding pt laterally from drop arm recliner to bed) PT Visit Diagnosis: Other abnormalities of gait and mobility (R26.89);Hemiplegia and hemiparesis Hemiplegia - Right/Left: Left Hemiplegia - dominant/non-dominant: Non-dominant Hemiplegia - caused by: Cerebral infarction     Time: 1002-1040 PT Time Calculation (min) (ACUTE ONLY): 38 min  Charges:  $Therapeutic Activity: 23-37 mins $Neuromuscular Re-education: 8-22 mins                     Zenaida Niece, PT, DPT Acute Rehabilitation Office [phone]    Zenaida Niece 09/11/2022, 10:58 AM

## 2022-09-11 NOTE — Progress Notes (Signed)
STROKE TEAM PROGRESS NOTE   INTERVAL HISTORY Patient seen sitting up in her bed.  No family at bedside.  Her vital signs have been stable, and her neurological exam is unchanged.  Her prosthetic legs have been brought in from home, so she can ambulate more.  She states that she has been eating more since yesterday and wants her feeding tube to be removed.  Physical therapy plans to ambulate her using her prosthetic legs today.  Vital signs stable.  Neurological exam unchanged.  Vitals:   09/11/22 0311 09/11/22 0500 09/11/22 0748 09/11/22 1200  BP:   136/72 122/70  Pulse:   77 80  Resp: (!) 21  18 10   Temp: 97.6 F (36.4 C)  99.6 F (37.6 C) 98.7 F (37.1 C)  TempSrc: Axillary  Oral Oral  SpO2:   100% 94%  Weight:  45 kg     CBC:  Recent Labs  Lab 09/10/22 0411 09/11/22 0345  WBC 8.5 9.0  HGB 10.2* 10.0*  HCT 33.3* 31.6*  MCV 75.7* 75.2*  PLT 577* 419*   Basic Metabolic Panel:  Recent Labs  Lab 09/10/22 0411 09/11/22 0345  NA 135 133*  K 4.3 4.2  CL 104 100  CO2 22 22  GLUCOSE 135* 128*  BUN 26* 30*  CREATININE 0.69 0.70  CALCIUM 8.7* 8.7*   Lipid Panel:  No results for input(s): "CHOL", "TRIG", "HDL", "CHOLHDL", "VLDL", "LDLCALC" in the last 168 hours.  HgbA1c: No results for input(s): "HGBA1C" in the last 168 hours. Urine Drug Screen: No results for input(s): "LABOPIA", "COCAINSCRNUR", "LABBENZ", "AMPHETMU", "THCU", "LABBARB" in the last 168 hours.  Alcohol Level  No results for input(s): "ETH" in the last 168 hours.   IMAGING past 24 hours No results found.  Temp:  [97.6 F (36.4 C)-99.6 F (37.6 C)] 98.7 F (37.1 C) (01/15 1200) Pulse Rate:  [75-84] 80 (01/15 1200) Resp:  [10-21] 10 (01/15 1200) BP: (122-136)/(68-73) 122/70 (01/15 1200) SpO2:  [94 %-100 %] 94 % (01/15 1200) Weight:  [45 kg] 45 kg (01/15 0500)  General -frail elderly Caucasian lady, in no apparent distress. HEENT- atraumatic, normocephalic  Cardiovascular - Regular rhythm and  rate. Pulmonary- equal chest rise; unlabored respirations.  Extremities- BLE AKA  Neuro: Mental Status -  Alert and awake.  Orientation to time, place and person intact.   Language intact. Speech moderate dysarthria. Attention span and concentration were normal.  Right upper and right lower extremities are normal strength and movement-5/5.  Left upper and lower extremities with 2 out of 5 strength She has left facial droop.  Sensation is decreased on the left. Visual fields are intact. Her extraocular movements are intact. Right gaze preference but crosses midline. She follows commands. Gait is not observed as noted above she has a bilateral AKA.   ASSESSMENT/PLAN [name] is a 67 y.o. female with history of CAD, GERD, CVA in March of 2023, HTN, HLD, PAD with bilateral AKA, AF on Eliquis, AICD, Sjogren's syndrome, and cardiomyopathy presenting with right gaze deviation and left-sided weakness. LKW 08/28/22 15:45.  Stroke: Acute right hemispheric infarct due to acute right ICA terminus occlusion.  Etiology is likely embolic secondary to atrial fibrillation s/p IR with TICI3 Code Stroke right M1 segment concerning for acute thrombus.  Slight asymmetric hypoattenuation in the right lentiform nucleus and insular ribbon.  This could represent early infarct. ASPECTS 8/10.   CTA head & neck occlusion of the right ICA in its distal segment without reconstitution  prior to the skull base.  The right ICA is occluded through the ICA terminus.  Right M1 and A1 segments are occluded.  Minimal filling of distal right MCA branch vessels with some collaterals.   IR with ICA, MCA, and ACA achieving complete recanalization.   CT 1/2 right MCA infarct with petechial hemorrhage, interhemispheric SAH CT 1/5 decreased interhemispheric SAH, right BG infarct with petechial hemorrhage, stable. CT 1/11 stable right hemispheric infarct with no hemorrhagic transformation, virtually resolved subarachnoid  hemorrhage MRI unable to obtain due to PPM. 2D Echo EF 55 to 60% LDL 82 HgbA1c 6.6 VTE prophylaxis -fully anticoagulated with Eliquis Eliquis (apixaban) daily prior to admission, now fully anticoagulated with Eliquis Therapy recommendations: SNF Disposition: Pending  History of CVA and seizure 10/2014 admitted for focal seizure.  Patient not on AED. 10/2021 presenting with speech disturbance and right upper extremity weakness.  CT showed left large subcortical infarct.  CTA head and neck left ICA 60% stenosis.  Carotid Doppler left ICA 40 to 59% stenosis.  EF 55 to 60%.  LDL 86, A1c 6.2. It was felt that her stroke was due to PAF with Eliquis nonadherence as patient stopped taking AC as she perceived "it was not doing anything for me". Discharged on Eliquis and Crestor 40. Patient had residual dysarthria and improving RUE weakness  Dysphagia MBS- done 1/9- dysphagia 2 with honey thick liquids Cortrak in place with TF On nocturnal feeding to facilitate po intake  Paroxysmal atrial fibrillation Previously noncompliant with CVA in 10/2021 Patient endorses current compliance with Eliquis  Repeat head CT looks good with virtual resolution of subarachnoid hemorrhage, will restart Eliquis  Hypertension Home meds: Lopressor Stable  On Norvasc 10  long-term BP goal normotensive  Hyperlipidemia Home meds: Crestor 40 mg, resumed in hospital LDL 82, goal < 70 Continue statin at discharge  PAD status post bilateral AKA Right leg pain Right leg warm, no sign of ischemic limb  pain likely due to PAD versus lying in the bed without being up and about with her prosthesis as she usually is. Continue gabapentin 100->200 tid Tylenol and oxycodone as needed  Other Stroke Risk Factors Advanced Age >/= 34  Former cigarette smoker Coronary artery disease  Other Active Problems SSS status post pacemaker Sjogren syndrome Constipation - bowel regimen - fleet enema today  Hospital day #  14  Patient was counseled to be compliant with her diet and eat so that we plan to discontinue the core track tube today.  Continue ongoing therapies.  Medically stable to be transferred to rehabilitation when bed available. Antony Contras, MD 09/11/2022 1:35 PM    To contact Stroke Continuity provider, please refer to [email or web address] After hours, contact General Neurology

## 2022-09-11 NOTE — Progress Notes (Signed)
Speech Language Pathology Treatment: Dysphagia;Cognitive-Linquistic  Patient Details Name: Mercedes [name] MRN: [id number] DOB: Oct 21, 1955 Today's Date: 09/11/2022 Time: 6433-2951 SLP Time Calculation (min) (ACUTE ONLY): 22 min  Assessment / Plan / Recommendation Clinical Impression  Pt reported feeding tube was removed this morning, but she did not feel hungry to eat her lunch or try many POs during session. Pt had trials of regular and Dys 2 type solids, with L pocketing noted while speaking during eating. Although pt did ultimately clear her oral cavity, she agreed to try only limited amounts of POs, despite heavy cueing for reasoning. This limited SLP ability to assess for readiness to upgrade diet. Pt participated in verbal tasks related to problem solving and anticipatory awareness requiring max verbal cueing. SLP also provided cueing for recall of recent hospital events, but question overall orientation to situation.    HPI HPI: 67 yo F presenting 08/28/22 with L sided weakness, not speaking, R gaze preference. 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 Pt was evaluated by SLP in March 2023 and found to have mild-moderate dysarthria; cognitive-linguistic function and swallowing WFL. PMH includes: L frontal CVA, GERD, sjogren's syndrome, B AKA, takotsubo CM, SLE, RA, PAD, MI, HTN, CAD, anxiety, depression, chiari malformation. MBS 1/9 revealed moderate risk of dyspagia due to oropharyngeal dyspagia characterized by poor coordination for swallow and facial weakness causing anterior loss.      SLP Plan  Continue with current plan of care      Recommendations for follow up therapy are one component of a multi-disciplinary discharge planning process, led by the attending physician.  Recommendations may be updated based on patient status, additional functional criteria and insurance authorization.    Recommendations  Diet recommendations: Dysphagia 1 (puree);Honey-thick liquid Liquids  provided via: Straw;Cup Medication Administration: Crushed with puree Supervision: Staff to assist with self feeding Compensations: Slow rate;Small sips/bites;Monitor for anterior loss;Lingual sweep for clearance of pocketing Postural Changes and/or Swallow Maneuvers: Seated upright 90 degrees                Oral Care Recommendations: Oral care BID Follow Up Recommendations: Skilled nursing-short term rehab (<3 hours/day) Assistance recommended at discharge: Frequent or constant Supervision/Assistance SLP Visit Diagnosis: Dysphagia, oropharyngeal phase (R13.12) Plan: Continue with current plan of care          Fabio Asa, Student SLP 09/11/2022, 3:39 PM

## 2022-09-12 DIAGNOSIS — I63231 Cerebral infarction due to unspecified occlusion or stenosis of right carotid arteries: Secondary | ICD-10-CM | POA: Diagnosis not present

## 2022-09-12 LAB — GLUCOSE, CAPILLARY
Glucose-Capillary: 126 mg/dL — ABNORMAL HIGH (ref 70–99)
Glucose-Capillary: 111 mg/dL — ABNORMAL HIGH (ref 70–99)
Glucose-Capillary: 152 mg/dL — ABNORMAL HIGH (ref 70–99)
Glucose-Capillary: 187 mg/dL — ABNORMAL HIGH (ref 70–99)
Glucose-Capillary: 139 mg/dL — ABNORMAL HIGH (ref 70–99)

## 2022-09-12 NOTE — Progress Notes (Signed)
STROKE TEAM PROGRESS NOTE   INTERVAL HISTORY Patient seen sitting up in her bed.  Her RN is at bedside.  Her vital signs have been stable, and her neurological exam is unchanged.   She did attempt to ambulate yesterday with physical therapy using a prosthetic legs but did not do very well..  Vital signs stable.  Neurological exam unchanged.  Her feeding tube has been discontinued and she has been eating about 75% of her diet  Vitals:   09/12/22 0308 09/12/22 0500 09/12/22 0841 09/12/22 1536  BP: 136/69  (!) 147/68 (!) 141/66  Pulse:   87 96  Resp:   17 (!) 21  Temp: 97.9 F (36.6 C)  98.2 F (36.8 C) 99.2 F (37.3 C)  TempSrc: Oral  Oral Oral  SpO2:   96% 95%  Weight:  45 kg     CBC:  Recent Labs  Lab 09/10/22 0411 09/11/22 0345  WBC 8.5 9.0  HGB 10.2* 10.0*  HCT 33.3* 31.6*  MCV 75.7* 75.2*  PLT 577* 008*   Basic Metabolic Panel:  Recent Labs  Lab 09/10/22 0411 09/11/22 0345  NA 135 133*  K 4.3 4.2  CL 104 100  CO2 22 22  GLUCOSE 135* 128*  BUN 26* 30*  CREATININE 0.69 0.70  CALCIUM 8.7* 8.7*   Lipid Panel:  No results for input(s): "CHOL", "TRIG", "HDL", "CHOLHDL", "VLDL", "LDLCALC" in the last 168 hours.  HgbA1c: No results for input(s): "HGBA1C" in the last 168 hours. Urine Drug Screen: No results for input(s): "LABOPIA", "COCAINSCRNUR", "LABBENZ", "AMPHETMU", "THCU", "LABBARB" in the last 168 hours.  Alcohol Level  No results for input(s): "ETH" in the last 168 hours.   IMAGING past 24 hours No results found.  Temp:  [97.9 F (36.6 C)-99.2 F (37.3 C)] 99.2 F (37.3 C) (01/16 1536) Pulse Rate:  [64-96] 96 (01/16 1536) Resp:  [16-21] 21 (01/16 1536) BP: (131-151)/(60-88) 141/66 (01/16 1536) SpO2:  [94 %-96 %] 95 % (01/16 1536) Weight:  [45 kg] 45 kg (01/16 0500)  General -frail elderly Caucasian lady, in no apparent distress. HEENT- atraumatic, normocephalic  Cardiovascular - Regular rhythm and rate. Pulmonary- equal chest rise; unlabored  respirations.  Extremities- BLE AKA  Neuro: Mental Status -  Alert and awake.  Orientation to time, place and person intact.   Language intact. Speech moderate dysarthria. Attention span and concentration were normal.  Right upper and right lower extremities are normal strength and movement-5/5.  Left upper and lower extremities with 2 out of 5 strength She has left facial droop.  Sensation is decreased on the left. Visual fields are intact. Her extraocular movements are intact. Right gaze preference but crosses midline. She follows commands. Gait is not observed as noted above she has a bilateral AKA.   ASSESSMENT/PLAN Mercedes [name] is a 67 y.o. female with history of CAD, GERD, CVA in March of 2023, HTN, HLD, PAD with bilateral AKA, AF on Eliquis, AICD, Sjogren's syndrome, and cardiomyopathy presenting with right gaze deviation and left-sided weakness. LKW 08/28/22 15:45.  Stroke: Acute right hemispheric infarct due to acute right ICA terminus occlusion.  Etiology is likely embolic secondary to atrial fibrillation s/p IR with TICI3 Code Stroke right M1 segment concerning for acute thrombus.  Slight asymmetric hypoattenuation in the right lentiform nucleus and insular ribbon.  This could represent early infarct. ASPECTS 8/10.   CTA head & neck occlusion of the right ICA in its distal segment without reconstitution prior to the skull base.  The right ICA is occluded through the ICA terminus.  Right M1 and A1 segments are occluded.  Minimal filling of distal right MCA branch vessels with some collaterals.   IR with ICA, MCA, and ACA achieving complete recanalization.   CT 1/2 right MCA infarct with petechial hemorrhage, interhemispheric SAH CT 1/5 decreased interhemispheric SAH, right BG infarct with petechial hemorrhage, stable. CT 1/11 stable right hemispheric infarct with no hemorrhagic transformation, virtually resolved subarachnoid hemorrhage MRI unable to obtain due to  PPM. 2D Echo EF 55 to 60% LDL 82 HgbA1c 6.6 VTE prophylaxis -fully anticoagulated with Eliquis Eliquis (apixaban) daily prior to admission, now fully anticoagulated with Eliquis Therapy recommendations: SNF Disposition: Pending  History of CVA and seizure 10/2014 admitted for focal seizure.  Patient not on AED. 10/2021 presenting with speech disturbance and right upper extremity weakness.  CT showed left large subcortical infarct.  CTA head and neck left ICA 60% stenosis.  Carotid Doppler left ICA 40 to 59% stenosis.  EF 55 to 60%.  LDL 86, A1c 6.2. It was felt that her stroke was due to PAF with Eliquis nonadherence as patient stopped taking AC as she perceived "it was not doing anything for me". Discharged on Eliquis and Crestor 40. Patient had residual dysarthria and improving RUE weakness  Dysphagia MBS- done 1/9- dysphagia 2 with honey thick liquids Cortrak in place with TF On nocturnal feeding to facilitate po intake  Paroxysmal atrial fibrillation Previously noncompliant with CVA in 10/2021 Patient endorses current compliance with Eliquis  Repeat head CT looks good with virtual resolution of subarachnoid hemorrhage, will restart Eliquis  Hypertension Home meds: Lopressor Stable  On Norvasc 10  long-term BP goal normotensive  Hyperlipidemia Home meds: Crestor 40 mg, resumed in hospital LDL 82, goal < 70 Continue statin at discharge  PAD status post bilateral AKA Right leg pain Right leg warm, no sign of ischemic limb  pain likely due to PAD versus lying in the bed without being up and about with her prosthesis as she usually is. Continue gabapentin 100->200 tid Tylenol and oxycodone as needed  Other Stroke Risk Factors Advanced Age >/= 81  Former cigarette smoker Coronary artery disease  Other Active Problems SSS status post pacemaker Sjogren syndrome Constipation - bowel regimen - fleet enema today  Hospital day # 15  Patient was counseled to be compliant with  her diet and eat well.  Continue ongoing therapies.  Medically stable to be transferred to rehabilitation when bed available. Antony Contras, MD 09/12/2022 3:45 PM    To contact Stroke Continuity provider, please refer to [email or web address] After hours, contact General Neurology

## 2022-09-12 NOTE — TOC Progression Note (Addendum)
Transition of Care [facility]) - Progression Note    Patient Details  Name: Mercedes [name] MRN: [id number] Date of Birth: March 13, 1956  Transition of Care [facility]) CM/SW [place], [place] Phone Number: 09/12/2022, 10:58 AM  Clinical Narrative:     Called patient's daughter, provided with bed SNF bed offers. She advised no one informed her patient was  ready for discharge. She requested to review SNF choices and call CSW back tomorrow by 11am with SNF choice.  TOC will continue to follow and assist with discharge planning.   Thurmond Butts, MSW, LCSW Clinical Social Worker    Expected Discharge Plan: Skilled Nursing Facility Barriers to Discharge: Ship broker, Continued Medical Work up, SNF Pending bed offer  Expected Discharge Plan and Services In-house Referral: Clinical Social Work   Post Acute Care Choice: [place] Living arrangements for the past 2 months: [place] Determinants of Health (SDOH) Interventions SDOH Screenings   Food Insecurity: No Food Insecurity (07/15/2021)  Housing: Low Risk  (07/15/2021)  Transportation Needs: No Transportation Needs (07/15/2021)  Alcohol Screen: Low Risk  (07/15/2021)  Depression (PHQ2-9): Low Risk  (11/08/2021)  Financial Resource Strain: Low Risk  (07/15/2021)  Physical Activity: Inactive (07/15/2021)  Social Connections: Unknown (07/15/2021)  Stress: No Stress Concern Present (07/15/2021)  Tobacco Use: Medium Risk (08/29/2022)    Readmission Risk Interventions    03/17/2022   10:38 AM 09/08/2020   12:03 PM  Readmission Risk Prevention Plan  Post Dischage Appt Complete   Medication Screening Complete   Transportation Screening Complete Complete  PCP or Specialist Appt within 5-7 Days  Complete  Home Care Screening  Complete  Medication Review (RN CM)  Complete

## 2022-09-12 NOTE — Progress Notes (Signed)
Occupational Therapy Treatment Patient Details Name: Mercedes [name] MRN: [id number] DOB: 1955-09-06 Today's Date: 09/12/2022   History of present illness 67 yo s/p 08/28/22 R ICA occlusion with thrombectomy NIH 74 PMH L frontal CVA B AKA, takotsubo CM, SLE, RA, PAD, MI, HTN, CAD.   OT comments  Goals updated this session, new goals established. Pt session today focused on engaging in grooming task while working on seated balance EOB. Pt continues to require mod to min A seated EOB due to L inattention, L lateral lean and posterior lean. Donned prosthetic sleeves, but Pt not at a level to attempt standing/ambulation for a few reasons. 1) the prosthetics are shorter, and cannot reach the floor from our beds 2) She cannot maintain a safe seated balance on her own yet. They are in the room, and therapy teams are monitoring her for appropriateness to attempt standing with them. Today she was a max assist for AP transfer to the recliner. Lift pad underneath for RN staff to get back to bed. PROM?AAROM for LUE including interlacing fingers from R hand with Left and lifting up in the front. Pt continues with mumbling speech. POC - dc to SNF remains appropriate and OT will continue to follow acutely.    Recommendations for follow up therapy are one component of a multi-disciplinary discharge planning process, led by the attending physician.  Recommendations may be updated based on patient status, additional functional criteria and insurance authorization.    Follow Up Recommendations  Skilled nursing-short term rehab (<3 hours/day)     Assistance Recommended at Discharge Intermittent Supervision/Assistance  Patient can return home with the following  Two people to help with walking and/or transfers;A lot of help with bathing/dressing/bathroom   Equipment Recommendations  Wheelchair (measurements OT);Wheelchair cushion (measurements OT);Hospital bed;Other (comment) (hoyer)    Recommendations for Other  Services      Precautions / Restrictions Precautions Precautions: Fall Precaution Comments: SBP 120-140 Restrictions Weight Bearing Restrictions: No Other Position/Activity Restrictions: h/o B AKA       Mobility Bed Mobility Overal bed mobility: Needs Assistance Bed Mobility: Supine to Sit, Rolling Rolling: Mod assist   Supine to sit: Mod assist, HOB elevated     General bed mobility comments: assist for sequencing, physical assist to elevate trunk from sidelying and to pivot L hip toward edge of bed    Transfers Overall transfer level: Needs assistance Equipment used: None Transfers: Bed to chair/wheelchair/BSC         Anterior-Posterior transfers: Max assist   General transfer comment: pt demonstrates improved push with RUE, better initiating scoots, continues to require significant assistance from OT to perform AP transfer     Balance Overall balance assessment: Needs assistance Sitting-balance support: Single extremity supported, Feet supported Sitting balance-Leahy Scale: Poor Sitting balance - Comments: min A-mod A with RUE support of railing, modA without UE support Postural control: Posterior lean, Left lateral lean                                 ADL either performed or assessed with clinical judgement   ADL Overall ADL's : Needs assistance/impaired     Grooming: Oral care;Minimal assistance;Sitting Grooming Details (indicate cue type and reason): required assist for Bil hand functions with oral care             Lower Body Dressing: Maximal assistance;Bed level Lower Body Dressing Details (indicate cue type and reason): donning sleeves for  prosthetics Toilet Transfer: Maximal assistance;Anterior/posterior Statistician Details (indicate cue type and reason): simulated through recliner transfer         Functional mobility during ADLs: Maximal assistance (AP transfer) General ADL Comments: L inattention, decreased selfawareness  of deficits    Extremity/Trunk Assessment Upper Extremity Assessment Upper Extremity Assessment: LUE deficits/detail LUE Deficits / Details: Slight shoulder shrug today, active finger flexion/extension generally 2/5 grasp, thumb activating LUE Coordination: decreased fine motor;decreased gross motor   Lower Extremity Assessment Lower Extremity Assessment: Defer to PT evaluation        Vision       Perception     Praxis      Cognition Arousal/Alertness: Awake/alert Behavior During Therapy: Flat affect Overall Cognitive Status: Within Functional Limits for tasks assessed                                 General Comments: limited vocalization        Exercises General Exercises - Upper Extremity Shoulder Flexion: PROM, Left, 10 reps Shoulder ABduction: PROM, Left, 10 reps Elbow Flexion: PROM, Left, 10 reps Elbow Extension: PROM, Left, 10 reps Wrist Flexion: AAROM, Left, 10 reps Wrist Extension: AAROM, Left, 10 reps Digit Composite Flexion: Left, 5 reps, AAROM Composite Extension: Left, 10 reps, AAROM Other Exercises Other Exercises: linked L and R hands at midline (interwoven fingers) and lifted together to 60 degrees x10 Other Exercises: sholder shrug x5    Shoulder Instructions       General Comments      Pertinent Vitals/ Pain       Pain Assessment Pain Assessment: Faces Faces Pain Scale: Hurts a little bit Pain Location: bilateral residual limbs Pain Descriptors / Indicators: Grimacing, Discomfort Pain Intervention(s): Monitored during session, Repositioned  Home Living                                          Prior Functioning/Environment              Frequency  Min 2X/week        Progress Toward Goals  OT Goals(current goals can now be found in the care plan section)  Progress towards OT goals: Progressing toward goals  Acute Rehab OT Goals Patient Stated Goal: get back to PLOF OT Goal Formulation: With  patient Time For Goal Achievement: 09/26/22 Potential to Achieve Goals: Good ADL Goals Additional ADL Goal #2: Pt will demonstrate static sitting balance with supervision as precursor to ADL  Plan Discharge plan remains appropriate    Co-evaluation                 AM-PAC OT "6 Clicks" Daily Activity     Outcome Measure   Help from another person eating meals?: A Lot Help from another person taking care of personal grooming?: A Lot Help from another person toileting, which includes using toliet, bedpan, or urinal?: Total Help from another person bathing (including washing, rinsing, drying)?: Total Help from another person to put on and taking off regular upper body clothing?: A Lot Help from another person to put on and taking off regular lower body clothing?: Total 6 Click Score: 9    End of Session    OT Visit Diagnosis: Unsteadiness on feet (R26.81);Muscle weakness (generalized) (M62.81)   Activity Tolerance Patient tolerated treatment well   Patient Left  in chair;with chair alarm set;with call bell/phone within reach;Other (comment) (lift pad placed underneath)   Nurse Communication Mobility status;Need for lift equipment (no amputee lift pads - cross and keep more supine during transfer)        Time: 3818-2993 OT Time Calculation (min): 35 min  Charges: OT General Charges $OT Visit: 1 Visit OT Treatments $Self Care/Home Management : 8-22 mins $Therapeutic Activity: 8-22 mins  Jesse Sans OTR/L Acute Rehabilitation Services Office: [place] 09/12/2022, 1:04 PM

## 2022-09-12 NOTE — Progress Notes (Signed)
Nutrition Follow-up  DOCUMENTATION CODES:   Underweight, Severe malnutrition in context of chronic illness  INTERVENTION:   - Continue Magic Cup TID with meals, each supplement provides 290 kcal and 9 grams of protein  - Encourage PO intake  NUTRITION DIAGNOSIS:   Severe Malnutrition related to chronic illness as evidenced by severe muscle depletion, severe fat depletion.  Ongoing, being addressed via oral nutrition supplements  GOAL:   Patient will meet greater than or equal to 90% of their needs  Progressing  MONITOR:   TF tolerance  REASON FOR ASSESSMENT:   Consult Enteral/tube feeding initiation and management  ASSESSMENT:   Pt with PAD s/p B AKAs, Takotsubo cardiomyopathy, GERD, depression, HLD, HTN, CAD, SLE, sjogren's syndrome, anxiety, and RA admitted from home where she was independent with R ICA occlusion s/p thrombectomy.  01/03 - Cortrak placed, TF started 01/09 - s/p MBS, diet advanced to dysphagia 2 with honey-thick liquids 01/10 - transitioned to nocturnal TF 01/11 - diet downgraded to dysphagia 1 with honey-thick liquids 01/15 - Cortrak removed  Spoke with pt at bedside. Pt sleeping soundly in recliner and woke briefly to RD voice. Cortrak removed yesterday. Pt reports that she is "eating" but did not offer any additional information prior to closing her eyes and going back to sleep. Meal completions have been improved over the last 2 days. Pt consumed 75% of 2 meals on 09/11/22 and 50% and 75% of 2 meals on 09/12/22. Will continue current supplement regimen.  Admit weight: 43.4 kg Current weight: 45 kg  Meal Completion: 20-75%  Medications reviewed and include: colace, miralax  Labs reviewed: sodium 133, BUN 30, hemoglobin 10.0 CBG's: 112-135 x 24 hours  Diet Order:   Diet Order             DIET - DYS 1 Room service appropriate? No; Fluid consistency: Honey Thick  Diet effective now                   EDUCATION NEEDS:   No education  needs have been identified at this time  Skin:  Skin Assessment: Reviewed RN Assessment  Last BM:  09/11/22  Height:   Ht Readings from Last 1 Encounters:  07/05/22 5\' 5"  (1.651 m)    Weight:   Wt Readings from Last 1 Encounters:  09/12/22 45 kg    Ideal Body Weight:  47.7 kg (post B AKA)  BMI:  Body mass index is 16.51 kg/m.  Estimated Nutritional Needs:   Kcal:  1400-1600  Protein:  70-95 grams  Fluid:  >1.5 L/day    Gustavus Bryant, MS, RD, LDN Inpatient Clinical Dietitian Please see AMiON for contact information.

## 2022-09-13 ENCOUNTER — Other Ambulatory Visit: Payer: Self-pay | Admitting: Internal Medicine

## 2022-09-13 ENCOUNTER — Inpatient Hospital Stay (HOSPITAL_COMMUNITY): Payer: Medicare HMO

## 2022-09-13 DIAGNOSIS — I10 Essential (primary) hypertension: Secondary | ICD-10-CM

## 2022-09-13 DIAGNOSIS — I63231 Cerebral infarction due to unspecified occlusion or stenosis of right carotid arteries: Secondary | ICD-10-CM | POA: Diagnosis not present

## 2022-09-13 LAB — GLUCOSE, CAPILLARY
Glucose-Capillary: 102 mg/dL — ABNORMAL HIGH (ref 70–99)
Glucose-Capillary: 119 mg/dL — ABNORMAL HIGH (ref 70–99)
Glucose-Capillary: 123 mg/dL — ABNORMAL HIGH (ref 70–99)
Glucose-Capillary: 127 mg/dL — ABNORMAL HIGH (ref 70–99)
Glucose-Capillary: 132 mg/dL — ABNORMAL HIGH (ref 70–99)
Glucose-Capillary: 98 mg/dL (ref 70–99)

## 2022-09-13 MED ORDER — AMLODIPINE BESYLATE 10 MG PO TABS
10.0000 mg | ORAL_TABLET | Freq: Every day | ORAL | Status: DC
  Administered 2022-09-14 – 2022-09-18 (×5): 10 mg via ORAL
  Filled 2022-09-13 (×5): qty 1

## 2022-09-13 MED ORDER — OXYCODONE HCL 5 MG PO TABS
5.0000 mg | ORAL_TABLET | Freq: Four times a day (QID) | ORAL | Status: DC | PRN
  Administered 2022-09-13 – 2022-09-18 (×9): 5 mg via ORAL
  Filled 2022-09-13 (×9): qty 1

## 2022-09-13 MED ORDER — GABAPENTIN 250 MG/5ML PO SOLN
200.0000 mg | Freq: Three times a day (TID) | ORAL | Status: DC
  Filled 2022-09-13 (×4): qty 4

## 2022-09-13 MED ORDER — DOCUSATE SODIUM 100 MG PO CAPS
100.0000 mg | ORAL_CAPSULE | Freq: Two times a day (BID) | ORAL | Status: DC
  Administered 2022-09-14 – 2022-09-17 (×6): 100 mg via ORAL
  Filled 2022-09-13 (×9): qty 1

## 2022-09-13 MED ORDER — APIXABAN 5 MG PO TABS
5.0000 mg | ORAL_TABLET | Freq: Two times a day (BID) | ORAL | Status: DC
  Administered 2022-09-13 – 2022-09-18 (×10): 5 mg via ORAL
  Filled 2022-09-13 (×10): qty 1

## 2022-09-13 MED ORDER — SENNOSIDES-DOCUSATE SODIUM 8.6-50 MG PO TABS: 1.0000 | ORAL_TABLET | Freq: Every evening | ORAL | Status: DC | PRN

## 2022-09-13 MED ORDER — POLYETHYLENE GLYCOL 3350 17 G PO PACK
17.0000 g | PACK | Freq: Every day | ORAL | Status: DC
  Administered 2022-09-14 – 2022-09-17 (×4): 17 g via ORAL
  Filled 2022-09-13 (×4): qty 1

## 2022-09-13 MED ORDER — ROSUVASTATIN CALCIUM 20 MG PO TABS
40.0000 mg | ORAL_TABLET | Freq: Every day | ORAL | Status: DC
  Administered 2022-09-14 – 2022-09-18 (×5): 40 mg via ORAL
  Filled 2022-09-13 (×5): qty 2

## 2022-09-13 NOTE — Progress Notes (Signed)
Physical Therapy Treatment Patient Details Name: Mercedes [name] MRN: [id number] DOB: [birth year] Today's Date: 09/13/2022   History of Present Illness 67 yo s/p 08/28/22 R ICA occlusion with thrombectomy NIH 19 PMH L frontal CVA B AKA, takotsubo CM, SLE, RA, PAD, MI, HTN, CAD.    PT Comments    Continue to focus on sitting EOB balance and lateral transfers into chair. Used slide board this date. Pt with difficulty using L UE to assist with pushing self across board. Pt motivated and gives great effort. Pt continues to require modA to maintain EOB balance. Pt unable to tolerate bilat LE shrinkers today due to bilat LE pain and pressure. Continue to recommend SNF upon d/c. Acute PT to cont to follow.    Recommendations for follow up therapy are one component of a multi-disciplinary discharge planning process, led by the attending physician.  Recommendations may be updated based on patient status, additional functional criteria and insurance authorization.  Follow Up Recommendations  Skilled nursing-short term rehab (<3 hours/day) Can patient physically be transported by private vehicle: No   Assistance Recommended at Discharge Frequent or constant Supervision/Assistance  Patient can return home with the following Assist for transportation;Two people to help with bathing/dressing/bathroom;Two people to help with walking and/or transfers;Help with stairs or ramp for entrance   Equipment Recommendations  Hospital bed;Other (comment) (hoyer lift)    Recommendations for Other Services       Precautions / Restrictions Precautions Precautions: Fall Precaution Comments: SBP 120-140 Restrictions Weight Bearing Restrictions: No Other Position/Activity Restrictions: h/o B AKA     Mobility  Bed Mobility Overal bed mobility: Needs Assistance Bed Mobility: Rolling, Sidelying to Sit Rolling: Mod assist   Supine to sit: Mod assist, HOB elevated     General bed mobility comments: assist  for sequencing, physical assist to elevate trunk from sidelying and to pivot L hip toward edge of bed    Transfers Overall transfer level: Needs assistance Equipment used: Sliding board Transfers: Bed to chair/wheelchair/BSC (drop arm to the R)            Lateral/Scoot Transfers: Max assist General transfer comment: trialed slide board transfer, pt with limited ability to use L UE to push to the R, however recliner only had the ability to have the L arm rest drop. maxA to maintain balance, tendency to loose balance posteriorly and to the L    Ambulation/Gait               General Gait Details: nonambulatory at baseline   Stairs             Wheelchair Mobility    Modified Rankin (Stroke Patients Only) Modified Rankin (Stroke Patients Only) Pre-Morbid Rankin Score: Moderately severe disability Modified Rankin: Severe disability     Balance Overall balance assessment: Needs assistance Sitting-balance support: Single extremity supported, Feet supported Sitting balance-Leahy Scale: Poor Sitting balance - Comments: min A-mod A with RUE support of railing, modA without UE support Postural control: Posterior lean, Left lateral lean                                  Cognition Arousal/Alertness: Awake/alert Behavior During Therapy: Flat affect, WFL for tasks assessed/performed Overall Cognitive Status: Within Functional Limits for tasks assessed  General Comments: delayed response time but appropriate, word finding difficulty at times        Exercises      General Comments General comments (skin integrity, edema, etc.): VSS on RA      Pertinent Vitals/Pain Pain Assessment Pain Assessment: Faces Faces Pain Scale: Hurts a little bit Pain Location: bilateral residual limbs Pain Descriptors / Indicators: Grimacing, Discomfort (inability to tolerate shrinkers at this time due to pressure)    Home  Living                          Prior Function            PT Goals (current goals can now be found in the care plan section) Acute Rehab PT Goals Patient Stated Goal: to return to transferring without assistance PT Goal Formulation: With patient Time For Goal Achievement: 09/25/22 Potential to Achieve Goals: Fair Progress towards PT goals: Progressing toward goals    Frequency    Min 3X/week      PT Plan Current plan remains appropriate    Co-evaluation              AM-PAC PT "6 Clicks" Mobility   Outcome Measure  Help needed turning from your back to your side while in a flat bed without using bedrails?: A Lot Help needed moving from lying on your back to sitting on the side of a flat bed without using bedrails?: A Lot Help needed moving to and from a bed to a chair (including a wheelchair)?: A Lot Help needed standing up from a chair using your arms (e.g., wheelchair or bedside chair)?: Total Help needed to walk in hospital room?: Total Help needed climbing 3-5 steps with a railing? : Total 6 Click Score: 9    End of Session   Activity Tolerance: Patient tolerated treatment well Patient left: in chair;with call bell/phone within reach;with chair alarm set Nurse Communication: Mobility status (recommend sliding pt laterally from drop arm recliner to bed or using lift) PT Visit Diagnosis: Other abnormalities of gait and mobility (R26.89);Hemiplegia and hemiparesis Hemiplegia - Right/Left: Left Hemiplegia - dominant/non-dominant: Non-dominant Hemiplegia - caused by: Cerebral infarction     Time: 5400-8676 PT Time Calculation (min) (ACUTE ONLY): 23 min  Charges:  $Therapeutic Activity: 23-37 mins                     Mercedes [name], PT, DPT Acute Rehabilitation Services Secure chat preferred Office #: [phone]    Mercedes [name] 09/13/2022, 12:08 PM

## 2022-09-13 NOTE — Discharge Summary (Addendum)
Stroke Discharge Summary  Patient ID: Mercedes [name]   MRN: [id number]      DOB: April 05, 1956  Date of Admission: 08/28/2022 Date of Discharge: 09/18/2022  Attending Physician:  Stroke, Md, [name], Stroke [name] Consultant(s): None Patient's PCP:  Pincus Sanes, [name]  DISCHARGE DIAGNOSIS: Acute right hemispheric infarct due to acute right ICA terminus occlusion. Etiology is likely embolic secondary to atrial fibrillation s/p IR with TICI3   Active Problems: History of stroke Seizure Dysphagia PAF Hypertension PAD status post bilateral AKA Hyperlipidemia CAD SSS status post pacemaker   Allergies as of 09/18/2022       Reactions   Brilinta [ticagrelor] Anaphylaxis, Shortness Of Breath, Other (See Comments)   Also, chest tightness   Latex Rash   Tape Rash        Medication List     STOP taking these medications    metoprolol tartrate 25 MG tablet Commonly known as: LOPRESSOR       TAKE these medications    acetaminophen 500 MG tablet Commonly known as: TYLENOL Take by mouth.   amLODipine 10 MG tablet Commonly known as: NORVASC Take 1 tablet (10 mg total) by mouth daily. Start taking on: September 19, 2022   bisacodyl 10 MG suppository Commonly known as: DULCOLAX Place 1 suppository (10 mg total) rectally daily as needed for moderate constipation.   docusate sodium 100 MG capsule Commonly known as: COLACE Take 1 capsule (100 mg total) by mouth 2 (two) times daily.   Eliquis 5 MG Tabs tablet Generic drug: apixaban TAKE 1 TABLET BY MOUTH TWICE  DAILY   gabapentin 300 MG capsule Commonly known as: NEURONTIN Take 1 capsule (300 mg total) by mouth every 8 (eight) hours.   nitroGLYCERIN 0.4 MG SL tablet Commonly known as: NITROSTAT Place 1 tablet (0.4 mg total) under the tongue every 5 (five) minutes as needed for chest pain.   polyethylene glycol 17 g packet Commonly known as: MIRALAX / GLYCOLAX Take 17 g by mouth daily. Start taking on: September 19, 2022   rosuvastatin 40 MG tablet Commonly known as: CRESTOR Take 1 tablet (40 mg total) by mouth daily.   senna-docusate 8.6-50 MG tablet Commonly known as: Senokot-S Take 1 tablet by mouth at bedtime as needed for mild constipation.        LABORATORY STUDIES CBC    Component Value Date/Time   WBC 10.2 09/17/2022 1011   RBC 4.40 09/17/2022 1011   HGB 9.9 (L) 09/17/2022 1011   HGB 14.2 10/09/2018 1125   HCT 32.3 (L) 09/17/2022 1011   HCT 44.6 10/09/2018 1125   PLT 577 (H) 09/17/2022 1011   PLT 289 10/09/2018 1125   MCV 73.4 (L) 09/17/2022 1011   MCV 79 10/09/2018 1125   MCH 22.5 (L) 09/17/2022 1011   MCHC 30.7 09/17/2022 1011   RDW 16.6 (H) 09/17/2022 1011   RDW 14.8 10/09/2018 1125   LYMPHSABS 2.9 08/28/2022 1738   MONOABS 0.6 08/28/2022 1738   EOSABS 0.3 08/28/2022 1738   BASOSABS 0.1 08/28/2022 1738   CMP    Component Value Date/Time   NA 131 (L) 09/17/2022 1011   NA 135 10/09/2018 1125   K 4.4 09/17/2022 1011   CL 99 09/17/2022 1011   CO2 22 09/17/2022 1011   GLUCOSE 112 (H) 09/17/2022 1011   BUN 13 09/17/2022 1011   BUN 10 10/09/2018 1125   CREATININE 0.71 09/17/2022 1011   CREATININE 1.13 (H) 08/03/2016 1440   CALCIUM  8.9 09/17/2022 1011   PROT 7.9 08/28/2022 1738   PROT 8.1 04/01/2018 0000   ALBUMIN 3.2 (L) 08/28/2022 1738   ALBUMIN 4.1 04/01/2018 0000   AST 30 08/28/2022 1738   ALT 10 08/28/2022 1738   ALKPHOS 101 08/28/2022 1738   BILITOT 1.0 08/28/2022 1738   BILITOT 0.4 04/01/2018 0000   GFRNONAA >60 09/17/2022 1011   GFRAA >60 11/24/2019 0620   COAGS Lab Results  Component Value Date   INR 1.0 08/28/2022   INR 1.1 03/14/2022   INR 1.0 11/21/2021   Lipid Panel    Component Value Date/Time   CHOL 144 08/29/2022 0515   CHOL 168 04/01/2018 0000   TRIG 88 08/29/2022 0515   HDL 44 08/29/2022 0515   HDL 54 04/01/2018 0000   CHOLHDL 3.3 08/29/2022 0515   VLDL 18 08/29/2022 0515   LDLCALC 82 08/29/2022 0515   LDLCALC 92 04/01/2018  0000   HgbA1C  Lab Results  Component Value Date   HGBA1C 6.6 (H) 07/05/2022   Urinalysis    Component Value Date/Time   COLORURINE YELLOW 11/21/2021 2204   APPEARANCEUR CLEAR 11/21/2021 2204   LABSPEC 1.016 11/21/2021 2204   PHURINE 6.0 11/21/2021 2204   GLUCOSEU NEGATIVE 11/21/2021 2204   HGBUR NEGATIVE 11/21/2021 2204   BILIRUBINUR NEGATIVE 11/21/2021 2204   KETONESUR NEGATIVE 11/21/2021 2204   PROTEINUR NEGATIVE 11/21/2021 2204   UROBILINOGEN 1.0 11/06/2014 1508   NITRITE NEGATIVE 11/21/2021 2204   LEUKOCYTESUR TRACE (A) 11/21/2021 2204   Urine Drug Screen     Component Value Date/Time   LABOPIA NONE DETECTED 11/21/2021 2230   COCAINSCRNUR NONE DETECTED 11/21/2021 2230   COCAINSCRNUR NEG 08/26/2009 2126   LABBENZ NONE DETECTED 11/21/2021 2230   LABBENZ NEG 08/26/2009 2126   AMPHETMU NONE DETECTED 11/21/2021 2230   THCU NONE DETECTED 11/21/2021 2230   LABBARB NONE DETECTED 11/21/2021 2230    Alcohol Level    Component Value Date/Time   ETH <10 08/28/2022 1738     SIGNIFICANT DIAGNOSTIC STUDIES DG Swallowing Func-Speech Pathology  Result Date: 09/13/2022 Table formatting from the original result was not included. Objective Swallowing Evaluation: Type of Study: MBS-Modified Barium Swallow Study  Patient Details Name: Mercedes [name] MRN: [id number] Date of Birth: 1956/03/12 Today's Date: 09/13/2022 Time: SLP Start Time (ACUTE ONLY): 1320 -SLP Stop Time (ACUTE ONLY): 1343 SLP Time Calculation (min) (ACUTE ONLY): 23 min Past Medical History: Past Medical History: Diagnosis Date  Abnormal LFTs   a. in the past when on Lipitor  Anemia   as a young woman  Anxiety   Arnold-Chiari malformation, type I (HCC)   MRI brain 02/2010  CAD (coronary artery disease)   a. NSTEMI 07/2009: LHC - D2 40%, LAD irreg., EF 50% with apical AK (tako-tsubo CM);  b. Inf STEMI (04/2013): Tx Promus DES to CFX;  c. 08/2013 Lexi CL: No ischemia, dist ant/ap/inf-ap infarct, EF  d. 06/2016 NSTEMI with LM  disease: s/p CABG on 07/17/2016 w/ LIMA-LAD, SVG-OM, and SVG-dRCA.  DEPRESSION   Dizziness   ? 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 anticoagulated with Eliquis 10/2014  Presence of permanent cardiac pacemaker 10/2014  leadless permanent pacemaker  RA (rheumatoid arthritis) (HCC)   prior tx by Dr. Dareen Piano  Sjogren's syndrome [facility]) 06/02/2013  pt denies this (12/01/13)  SLE (systemic lupus erythematosus) (HCC) dx 05/2013  follows with rheum Dareen Piano)  Stroke [facility])   March 2023, weakness in right arm is improving  Tachy-brady syndrome (HCC)   a. 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  f/u echo 09/2009: EF 50-55%, mild LVH, mod diast dysfxn, mild apical HK  Wears dentures   Wears glasses  Past Surgical History: Past Surgical History: Procedure Laterality Date  ABDOMINAL AORTAGRAM N/A 11/24/2013  Procedure: ABDOMINAL AORTAGRAM WITH BILATERAL RUNOFF WITH POSSIBLE INTERVENTION;  Surgeon: Chuck Hint, [name];  Location: [facility] CATH LAB;  Service: Cardiovascular;  Laterality: N/A;  ABDOMINAL AORTOGRAM W/LOWER EXTREMITY N/A 04/25/2019  Procedure: ABDOMINAL AORTOGRAM W/LOWER EXTREMITY;  Surgeon: Sherren Kerns, [name];  Location: [facility] INVASIVE CV LAB;  Service: Cardiovascular;  Laterality: N/A;  ABDOMINAL AORTOGRAM W/LOWER EXTREMITY Bilateral 05/15/2019  Procedure: ABDOMINAL AORTOGRAM W/LOWER EXTREMITY;  Surgeon: Nada Libman, [name];  Location: [facility] INVASIVE CV LAB;  Service: Cardiovascular;  Laterality: Bilateral;  ABDOMINAL AORTOGRAM W/LOWER EXTREMITY N/A 08/23/2020  Procedure: ABDOMINAL AORTOGRAM W/LOWER EXTREMITY;  Surgeon: Maeola Harman,  [name];  Location: [facility] INVASIVE CV LAB;  Service: Cardiovascular;  Laterality: N/A;  ABDOMINAL AORTOGRAM W/LOWER EXTREMITY N/A 10/29/2020  Procedure: ABDOMINAL AORTOGRAM W/LOWER EXTREMITY;  Surgeon: Sherren Kerns, [name];  Location: [facility] INVASIVE CV LAB;  Service: Cardiovascular;  Laterality: N/A;  AMPUTATION Left 12/02/2013  Procedure: LEFT 1ST TOE AMPUTATION;  Surgeon: Sherren Kerns, [name];  Location: [facility] OR;  Service: Vascular;  Laterality: Left;  AMPUTATION Left 07/16/2019  Procedure: LEFT TRANSMETATARSAL AMPUTATION;  Surgeon: Nadara Mustard, [name];  Location: [facility] OR;  Service: Orthopedics;  Laterality: Left;  AMPUTATION Left 10/01/2019  Procedure: LEFT AMPUTATION ABOVE KNEE;  Surgeon: Sherren Kerns, [name];  Location: [facility] OR;  Service: Vascular;  Laterality: Left;  AMPUTATION Right 08/31/2020  Procedure: Right first toe amputation;  Surgeon: Sherren Kerns, [name];  Location: [facility] OR;  Service: Vascular;  Laterality: Right;  AMPUTATION Right 11/23/2020  Procedure: RIGHT AMPUTATION ABOVE KNEE;  Surgeon: Sherren Kerns, [name];  Location: [facility] OR;  Service: Vascular;  Laterality: Right;  AMPUTATION Left 03/14/2022  Procedure: REVISION LEFT ABOVE KNEE AMPUTATION;  Surgeon: Maeola Harman, [name];  Location: [facility] OR;  Service: Vascular;  Laterality: Left;  APPLICATION OF WOUND VAC Left 11/03/2019  Procedure: Application Of Wound Vac, left lower extremity;  Surgeon: Sherren Kerns, [name];  Location: [facility] OR;  Service: Vascular;  Laterality: Left;  CARDIAC CATHETERIZATION N/A 07/12/2016  Procedure: Left Heart Cath and Coronary Angiography;  Surgeon: Lennette Bihari, [name];  Location: [facility] INVASIVE CV LAB;  Service: Cardiovascular;  Laterality: N/A;  CARDIAC CATHETERIZATION N/A 07/14/2016  Procedure: Intravascular Ultrasound/IVUS;  Surgeon: Yvonne Kendall, [name];  Location: [facility] INVASIVE CV LAB;  Service: Cardiovascular;  Laterality: N/A;  COLONOSCOPY    CORONARY ANGIOPLASTY  04/2013  CORONARY ARTERY BYPASS GRAFT N/A 07/17/2016  Procedure: CORONARY ARTERY  BYPASS GRAFTING (CABG)x3 using right greater saphenous vein harvested endoscopiclly and left internal mammary artery;  Surgeon: Kerin Perna, [name];  Location: [facility] OR;  Service: Open Heart Surgery;  Laterality: N/A;  ENDARTERECTOMY FEMORAL Left 05/12/2019  Procedure: LEFT Femoral Endarterectomy;  Surgeon: Sherren Kerns, [name];  Location: [facility] OR;  Service: Vascular;  Laterality: Left;  FEMORAL-POPLITEAL BYPASS GRAFT Left 12/02/2013  Procedure:  LEFT FEMORAL-BELOW KNEE POPLITEAL ARTERY BYPASS GRAFT;  Surgeon:  Sherren Kerns, [name];  Location: [facility] OR;  Service: Vascular;  Laterality: Left;  FEMORAL-POPLITEAL BYPASS GRAFT Left 09/25/2019  Procedure: THROMBECTOMY and PROPATEN BYPASS  FEMORAL TO BELOW KNEE POPLITEAL ARTERY BYPASS GRAFT LEFT LEG;  Surgeon: Larina Earthly, [name];  Location: [facility] OR;  Service: Vascular;  Laterality: Left;  FEMORAL-POPLITEAL BYPASS GRAFT Right 08/24/2020  Procedure: RIGHT FEMORAL-BELOW KNEE POPLITEAL ARTERY BYPASS USING GORE PROPATEN GRAFT;  Surgeon: Maeola Harman, [name];  Location: [facility] OR;  Service: Vascular;  Laterality: Right;  FEMORAL-POPLITEAL BYPASS GRAFT Right 11/01/2020  Procedure: Thrombectomy and Revision of Right Femoral to below knee Bypass with Interpostional graft to Tibial / peroneal Trunk and Endarterectomy Tibial [place]on [place]le Trunk.;  Surgeon: Sherren Kerns, [name];  Location: [facility] OR;  Service: Vascular;  Laterality: Right;  INTRAOPERATIVE ARTERIOGRAM Right 11/01/2020  Procedure: ANTEGRADE RIGHT LOWER EXTREMITY INTRA OPERATIVE ARTERIOGRAM;  Surgeon: Sherren Kerns, [name];  Location: [facility] OR;  Service: Vascular;  Laterality: Right;  IR CT HEAD LTD  08/28/2022  IR PERCUTANEOUS ART THROMBECTOMY/INFUSION INTRACRANIAL INC DIAG ANGIO  08/28/2022  IR US GUIDE VASC ACCESS RIGHT  08/28/2022  LEFT HEART CATHETERIZATION WITH CORONARY ANGIOGRAM N/A 05/19/2013  Procedure: LEFT HEART CATHETERIZATION WITH CORONARY ANGIOGRAM;  Surgeon: Micheline Chapman, [name];  Location: [facility] CATH LAB;  Service: Cardiovascular;   Laterality: N/A;  LEFT HEART CATHETERIZATION WITH CORONARY ANGIOGRAM N/A 10/28/2014  Procedure: LEFT HEART CATHETERIZATION WITH CORONARY ANGIOGRAM;  Surgeon: Iran Ouch, [name];  Location: [facility] CATH LAB;  Service: Cardiovascular;  Laterality: N/A;  LOWER EXTREMITY ANGIOGRAM Left 05/12/2019  Procedure: Lower Extremity Angiogram;  Surgeon: Sherren Kerns, [name];  Location: [facility] OR;  Service: Vascular;  Laterality: Left;  PATCH ANGIOPLASTY Left 05/12/2019  Procedure: Left Femoral Patch Angioplasty USING CADAVERIC SAPHENOUS VEIN;  Surgeon: Sherren Kerns, [name];  Location: [facility] OR;  Service: Vascular;  Laterality: Left;  PERCUTANEOUS CORONARY STENT INTERVENTION (PCI-S)  05/19/2013  Procedure: PERCUTANEOUS CORONARY STENT INTERVENTION (PCI-S);  Surgeon: Micheline Chapman, [name];  Location: [facility] CATH LAB;  Service: Cardiovascular;;  PERIPHERAL VASCULAR INTERVENTION Right 10/29/2020  Procedure: PERIPHERAL VASCULAR INTERVENTION;  Surgeon: Sherren Kerns, [name];  Location: [facility] INVASIVE CV LAB;  Service: Cardiovascular;  Laterality: Right;  common iliac  PERMANENT PACEMAKER INSERTION N/A 11/04/2014  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  RADIOLOGY WITH ANESTHESIA N/A 08/28/2022  Procedure: RADIOLOGY WITH ANESTHESIA;  Surgeon: Radiologist, Medication, [name];  Location: [facility] OR;  Service: Radiology;  Laterality: N/A;  TEE WITHOUT CARDIOVERSION N/A 07/17/2016  Procedure: TRANSESOPHAGEAL ECHOCARDIOGRAM (TEE);  Surgeon: Kerin Perna, [name];  Location: [facility] OR;  Service: Open Heart Surgery;  Laterality: N/A;  THROMBECTOMY FEMORAL ARTERY Left 05/12/2019  Procedure: Left Ilio-Femoral Artery and Femoral-popliteal Graft Thrombectomy;  Surgeon: Sherren Kerns, [name];  Location: [facility] OR;  Service: Vascular;  Laterality: Left;  THROMBECTOMY OF BYPASS GRAFT FEMORAL- POPLITEAL ARTERY Right 11/01/2020  Procedure: POSSIBLE THROMBOLYSIS VERSUS OPEN THROMBECTOMY AND REVSION OF RIGHT FEMORAL-POPLITEAL ARTERY BYPASS;  Surgeon: Sherren Kerns, [name];   Location: [facility] OR;  Service: Vascular;  Laterality: Right;  TUBAL LIGATION    VISCERAL ANGIOGRAPHY N/A 04/25/2019  Procedure: MESENTRIC  ANGIOGRAPHY;  Surgeon: Sherren Kerns, [name];  Location: [facility] INVASIVE CV LAB;  Service: Cardiovascular;  Laterality: N/A;  WOUND DEBRIDEMENT Left 11/03/2019  Procedure: Debridement Wound of left above the knee amputation;  Surgeon: Sherren Kerns, [name];  Location: [facility] OR;  Service: Vascular;  Laterality: Left; HPI: 67 yo F presenting 08/28/22 with L sided weakness, not speaking, R gaze  preference. 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 Pt was evaluated by SLP in March 2023 and found to have mild-moderate dysarthria; cognitive-linguistic function and swallowing WFL. PMH includes: L frontal CVA, GERD, sjogren's syndrome, B AKA, takotsubo CM, SLE, RA, PAD, MI, HTN, CAD, anxiety, depression, chiari malformation. MBS 1/9 revealed moderate risk of dyspagia due to oropharyngeal dyspagia characterized by poor coordination for swallow and facial weakness causing anterior loss.  Subjective: alert  Recommendations for follow up therapy are one component of a multi-disciplinary discharge planning process, led by the attending physician.  Recommendations may be updated based on patient status, additional functional criteria and insurance authorization. Assessment / Plan / Recommendation   09/13/2022   3:00 PM Clinical Impressions Clinical Impression Pt presents with improved oropharyngeal swallow.  She continues to present with oral deficits, with left-sided leakage and reduced bolus control.  She demonstrated improved timing of pharyngeal swallow with no observed aspiration of nectar-thick liquids; there was trace aspiration of thin liquids noted prior to the swallow and only when drinking from the edge of a cup. (Thin liquids provided from a spoon did not lead to aspiration).  There was adequate pharyngeal stripping and no residue post-swallow. Recommend advancing diet back to dysphagia 2;  allow nectar thick liquids. Pt needs full supervision and assistance with meals, including attention to her positioning (should be seated upright) and checking for oral loss and residue.  SLP will follow. SLP Visit Diagnosis Dysphagia, oropharyngeal phase (R13.12) Impact on safety and function Mild aspiration risk     09/13/2022   3:00 PM Treatment Recommendations Treatment Recommendations Therapy as outlined in treatment plan below     09/13/2022   3:00 PM Prognosis Prognosis for Safe Diet Advancement Good Barriers to Reach Goals Severity of deficits   09/13/2022   3:00 PM Diet Recommendations SLP Diet Recommendations Dysphagia 2 (Fine chop) solids;Nectar thick liquid Liquid Administration via Cup;Spoon Medication Administration Whole meds with puree Compensations Slow rate;Small sips/bites;Monitor for anterior loss;Lingual sweep for clearance of pocketing Postural Changes Seated upright at 90 degrees     09/13/2022   3:00 PM Other Recommendations Oral Care Recommendations Oral care BID Other Recommendations Prohibited food (jello, ice cream, thin soups);Remove water pitcher Follow Up Recommendations Skilled nursing-short term rehab (<3 hours/day) Functional Status Assessment Patient has had a recent decline in their functional status and demonstrates the ability to make significant improvements in function in a reasonable and predictable amount of time.   09/13/2022   3:00 PM Frequency and Duration  Speech Therapy Frequency (ACUTE ONLY) min 2x/week Treatment Duration 2 weeks     09/13/2022   3:00 PM Oral Phase Oral Phase Impaired Oral - Honey Teaspoon NT Oral - Honey Cup NT Oral - Nectar Teaspoon Weak lingual manipulation;Lingual/palatal residue Oral - Nectar Straw Weak lingual manipulation;Lingual/palatal residue Oral - Thin Teaspoon Lingual/palatal residue;Left anterior bolus loss Oral - Thin Cup Lingual/palatal residue;Left anterior bolus loss Oral - Thin Straw NT Oral - Puree Weak lingual manipulation;Delayed oral  transit Oral - Regular Delayed oral transit;Decreased bolus cohesion;Weak lingual manipulation    09/13/2022   3:00 PM Pharyngeal Phase Pharyngeal Phase Impaired Pharyngeal- Honey Teaspoon NT Pharyngeal- Honey Cup NT Pharyngeal- Nectar Teaspoon Delayed swallow initiation-pyriform sinuses Pharyngeal Material does not enter airway Pharyngeal- Nectar Cup Delayed swallow initiation-pyriform sinuses;Penetration/Aspiration before swallow Pharyngeal Material enters airway, remains ABOVE vocal cords then ejected out Pharyngeal- Nectar Straw NT Pharyngeal- Thin Teaspoon Delayed swallow initiation-pyriform sinuses Pharyngeal Material does not enter airway Pharyngeal- Thin Cup Delayed swallow  initiation-pyriform sinuses;Penetration/Aspiration before swallow Pharyngeal Material enters airway, passes BELOW cords without attempt by patient to eject out (silent aspiration) Pharyngeal- Thin Straw NT Pharyngeal- Puree Delayed swallow initiation-vallecula Pharyngeal- Regular Delayed swallow initiation-vallecula    09/05/2022  12:00 PM Cervical Esophageal Phase  Cervical Esophageal Phase WFL Mercedes [name] 09/13/2022, 4:04 PM                     CT HEAD WO CONTRAST ( )  Result Date: 09/07/2022 CLINICAL DATA:  67 year old female code stroke presentation on January 1st with right ICA T-occlusion, right ACA and MCA affected. Status post endovascular reperfusion. Subsequent encounter. EXAM: CT HEAD WITHOUT CONTRAST TECHNIQUE: Contiguous axial images were obtained from the base of the skull through the vertex without intravenous contrast. RADIATION DOSE REDUCTION: This exam was performed according to the departmental dose-optimization program which includes automated exposure control, adjustment of the mA and/or kV according to patient size and/or use of iterative reconstruction technique. COMPARISON:  Head CT 09/02/2022 and earlier. FINDINGS: Brain: Right hemisphere cytotoxic edema most confluent at the right basal ganglia and  insula has not significantly changed along with mass effect on the right lateral ventricle. Regressed and now virtually resolved small volume subarachnoid hemorrhage. No midline shift or ventriculomegaly. Basilar cisterns remain patent. Stable gray-white matter differentiation elsewhere. No new intracranial hemorrhage. Vascular: Calcified atherosclerosis at the skull base. Skull: No acute osseous abnormality identified. Sinuses/Orbits: Left nasoenteric tube remains in place. Visualized paranasal sinuses and mastoids are stable and well aerated. Other: No acute orbit or scalp soft tissue finding, some rightward gaze persists. IMPRESSION: 1. Stable right hemisphere infarct with no malignant hemorrhagic transformation. No midline shift. 2. Regressed and now virtually resolved subarachnoid hemorrhage. 3. No new intracranial abnormality. Electronically Signed   By: Odessa Fleming M.D.   On: 09/07/2022 05:55   DG Swallowing Func-Speech Pathology  Result Date: 09/05/2022 Table formatting from the original result was not included. Objective Swallowing Evaluation: Type of Study: MBS-Modified Barium Swallow Study  Patient Details Name: Mercedes [name] MRN: [id number] Date of Birth: [birth year] Today's Date: 09/05/2022 Time: SLP Start Time (ACUTE ONLY): 4098 -SLP Stop Time (ACUTE ONLY): 0953 SLP Time Calculation (min) (ACUTE ONLY): 20 min Past Medical History: Past Medical History: Diagnosis Date  Abnormal LFTs   a. in the past when on Lipitor  Anemia   as a young woman  Anxiety   Arnold-Chiari malformation, type I (HCC)   MRI brain 02/2010  CAD (coronary artery disease)   a. NSTEMI 07/2009: LHC - D2 40%, LAD irreg., EF 50% with apical AK (tako-tsubo CM);  b. Inf STEMI (04/2013): Tx Promus DES to CFX;  c. 08/2013 Lexi CL: No ischemia, dist ant/ap/inf-ap infarct, EF  d. 06/2016 NSTEMI with LM disease: s/p CABG on 07/17/2016 w/ LIMA-LAD, SVG-OM, and SVG-dRCA.  DEPRESSION   Dizziness   ? CVA 01/2010 - carotid dopplers with no ICA stenosis   GERD (gastroesophageal reflux disease)   Headache   History of transmetatarsal amputation of left foot (HCC) 07/17/2019  HYPERLIPIDEMIA   HYPERTENSION   MI (myocardial infarction) (HCC)   PAD (peripheral artery disease) (HCC)   s/p L fem pop 11/2013 in setting of 1st toe osteo/gangrene  Paroxysmal atrial fibrillation (HCC)   a. anticoagulated with Eliquis 10/2014  Presence of permanent cardiac pacemaker 10/2014  leadless permanent pacemaker  RA (rheumatoid arthritis) (HCC)   prior tx by Dr. Dareen Piano  Sjogren's syndrome [facility]) 06/02/2013  pt denies this (12/01/13)  SLE (systemic lupus erythematosus) (  [place]) dx 05/2013  follows with rheum [place])  Stroke [facility])   March 2023, weakness in right arm is improving  Tachy-brady syndrome ([place])   a. 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  f/u echo 09/2009: EF 50-55%, mild LVH, mod diast dysfxn, mild apical HK  Wears dentures   Wears glasses  Past Surgical History: Past Surgical History: Procedure Laterality Date  ABDOMINAL AORTAGRAM N/A 11/24/2013  Procedure: ABDOMINAL AORTAGRAM WITH BILATERAL RUNOFF WITH POSSIBLE INTERVENTION;  Surgeon: Angelia Mould, [name];  Location: [facility] CATH LAB;  Service: Cardiovascular;  Laterality: N/A;  ABDOMINAL AORTOGRAM W/LOWER EXTREMITY N/A 04/25/2019  Procedure: ABDOMINAL AORTOGRAM W/LOWER EXTREMITY;  Surgeon: Elam Dutch, [name];  Location: [place] CV LAB;  Service: Cardiovascular;  Laterality: N/A;  ABDOMINAL AORTOGRAM W/LOWER EXTREMITY Bilateral 05/15/2019  Procedure: ABDOMINAL AORTOGRAM W/LOWER EXTREMITY;  Surgeon: Serafina Mitchell, [name];  Location: [place] CV LAB;  Service: Cardiovascular;  Laterality: Bilateral;  ABDOMINAL AORTOGRAM W/LOWER EXTREMITY N/A 08/23/2020  Procedure: ABDOMINAL AORTOGRAM W/LOWER EXTREMITY;  Surgeon: Waynetta Sandy, [name];  Location: [place] [facility];  Service: Cardiovascular;  Laterality: N/A;  ABDOMINAL AORTOGRAM W/LOWER EXTREMITY N/A 10/29/2020  Procedure:  ABDOMINAL AORTOGRAM W/LOWER EXTREMITY;  Surgeon: Elam Dutch, [name];  Location: [place] [facility];  Service: Cardiovascular;  Laterality: N/A;  AMPUTATION Left 12/02/2013  Procedure: LEFT 1ST TOE AMPUTATION;  Surgeon: Elam Dutch, [name];  Location: [name];  Service: Vascular;  Laterality: Left;  AMPUTATION Left 07/16/2019  Procedure: LEFT TRANSMETATARSAL AMPUTATION;  Surgeon: Newt Minion, [name];  Location: [place];  Service: Orthopedics;  Laterality: Left;  AMPUTATION Left 10/01/2019  Procedure: LEFT AMPUTATION ABOVE KNEE;  Surgeon: Elam Dutch, [name];  Location: [place];  Service: Vascular;  Laterality: Left;  AMPUTATION Right 08/31/2020  Procedure: Right first toe amputation;  Surgeon: Elam Dutch, [name];  Location: [place];  Service: Vascular;  Laterality: Right;  AMPUTATION Right 11/23/2020  Procedure: RIGHT AMPUTATION ABOVE KNEE;  Surgeon: Elam Dutch, [name];  Location: [place];  Service: Vascular;  Laterality: Right;  AMPUTATION Left 03/14/2022  Procedure: REVISION LEFT ABOVE KNEE AMPUTATION;  Surgeon: Waynetta Sandy, [name];  Location: [place];  Service: Vascular;  Laterality: Left;  APPLICATION OF WOUND VAC Left 04/30/2670  Procedure: Application Of Wound Vac, left lower extremity;  Surgeon: Elam Dutch, [name];  Location: [place];  Service: Vascular;  Laterality: Left;  CARDIAC CATHETERIZATION N/A 07/12/2016  Procedure: Left Heart Cath and Coronary Angiography;  Surgeon: Troy Sine, [name];  Location: [place] [facility];  Service: Cardiovascular;  Laterality: N/A;  CARDIAC CATHETERIZATION N/A 07/14/2016  Procedure: Intravascular Ultrasound/IVUS;  Surgeon: Nelva Bush, [name];  Location: [place] [facility];  Service: Cardiovascular;  Laterality: N/A;  COLONOSCOPY    CORONARY ANGIOPLASTY  04/2013  CORONARY ARTERY BYPASS GRAFT N/A 07/17/2016  Procedure: CORONARY ARTERY BYPASS GRAFTING (CABG)x3 using right greater saphenous vein harvested endoscopiclly and left internal mammary artery;  Surgeon: Ivin Poot, [name];  Location: [place];  Service: Open Heart Surgery;  Laterality: N/A;  ENDARTERECTOMY FEMORAL Left 05/12/2019  Procedure: LEFT Femoral Endarterectomy;  Surgeon: Elam Dutch, [name];  [facility];  Service: Vascular;  Laterality: Left;  FEMORAL-POPLITEAL BYPASS GRAFT Left 12/02/2013  Procedure:  LEFT FEMORAL-BELOW KNEE POPLITEAL ARTERY BYPASS GRAFT;  Surgeon: Elam Dutch, [name];  Location: [place];  Service: Vascular;  Laterality: Left;  FEMORAL-POPLITEAL BYPASS GRAFT Left 09/25/2019  Procedure: THROMBECTOMY and PROPATEN BYPASS  FEMORAL TO BELOW  KNEE POPLITEAL ARTERY BYPASS GRAFT LEFT LEG;  Surgeon: Larina EarthlyEarly, Todd F, [name];  [facility];  Service: Vascular;  Laterality: Left;  FEMORAL-POPLITEAL BYPASS GRAFT Right 08/24/2020  Procedure: RIGHT FEMORAL-BELOW KNEE POPLITEAL ARTERY BYPASS USING GORE PROPATEN GRAFT;  Surgeon: Maeola Harmanain, Brandon Christopher, [name];  [facility];  Service: Vascular;  Laterality: Right;  FEMORAL-POPLITEAL BYPASS GRAFT Right 11/01/2020  Procedure: Thrombectomy and Revision of Right Femoral to below knee Bypass with Interpostional graft to Tibial / peroneal Trunk and Endarterectomy Tibial Weston Settle/Peroneal Trunk.;  Surgeon: Sherren KernsFields, Charles E, [name];  [facility];  Service: Vascular;  Laterality: Right;  INTRAOPERATIVE ARTERIOGRAM Right 11/01/2020  Procedure: ANTEGRADE RIGHT LOWER EXTREMITY INTRA OPERATIVE ARTERIOGRAM;  Surgeon: Sherren KernsFields, Charles E, [name];  [facility];  Service: Vascular;  Laterality: Right;  IR CT HEAD LTD  08/28/2022  IR PERCUTANEOUS ART THROMBECTOMY/INFUSION INTRACRANIAL INC DIAG ANGIO  08/28/2022  IR US GUIDE VASC ACCESS RIGHT  08/28/2022  LEFT HEART CATHETERIZATION WITH CORONARY ANGIOGRAM N/A 05/19/2013  Procedure: LEFT HEART CATHETERIZATION WITH CORONARY ANGIOGRAM;  Surgeon: Micheline ChapmanMichael D Cooper, [name];  [facility];  Service: Cardiovascular;  Laterality: N/A;  LEFT HEART CATHETERIZATION WITH CORONARY ANGIOGRAM N/A 10/28/2014  Procedure: LEFT HEART CATHETERIZATION WITH CORONARY  ANGIOGRAM;  Surgeon: Iran OuchMuhammad A Arida, [name];  [facility];  Service: Cardiovascular;  Laterality: N/A;  LOWER EXTREMITY ANGIOGRAM Left 05/12/2019  Procedure: Lower Extremity Angiogram;  Surgeon: Sherren KernsFields, Charles E, [name];  [facility];  Service: Vascular;  Laterality: Left;  PATCH ANGIOPLASTY Left 05/12/2019  Procedure: Left Femoral Patch Angioplasty USING CADAVERIC SAPHENOUS VEIN;  Surgeon: Sherren KernsFields, Charles E, [name];  [facility];  Service: Vascular;  Laterality: Left;  PERCUTANEOUS CORONARY STENT INTERVENTION (PCI-S)  05/19/2013  Procedure: PERCUTANEOUS CORONARY STENT INTERVENTION (PCI-S);  Surgeon: Micheline ChapmanMichael D Cooper, [name];  [facility];  Service: Cardiovascular;;  PERIPHERAL VASCULAR INTERVENTION Right 10/29/2020  Procedure: PERIPHERAL VASCULAR INTERVENTION;  Surgeon: Sherren KernsFields, Charles E, [name];  [facility];  Service: Cardiovascular;  Laterality: Right;  common iliac  PERMANENT PACEMAKER INSERTION N/A 11/04/2014  STJ Leadless pacemaker implanted by Dr Johney FrameAllred for tachy/brady  RADIOLOGY WITH ANESTHESIA N/A 08/28/2022  Procedure: RADIOLOGY WITH ANESTHESIA;  Surgeon: Radiologist, Medication, [name];  [facility];  Service: Radiology;  Laterality: N/A;  TEE WITHOUT CARDIOVERSION N/A 07/17/2016  Procedure: TRANSESOPHAGEAL ECHOCARDIOGRAM (TEE);  Surgeon: Kerin PernaPeter Van Trigt, [name];  [facility];  Service: Open Heart Surgery;  Laterality: N/A;  THROMBECTOMY FEMORAL ARTERY Left 05/12/2019  Procedure: Left Ilio-Femoral Artery and Femoral-popliteal Graft Thrombectomy;  Surgeon: Sherren KernsFields, Charles E, [name];  [facility];  Service: Vascular;  Laterality: Left;  THROMBECTOMY OF BYPASS GRAFT FEMORAL- POPLITEAL ARTERY Right 11/01/2020  Procedure: POSSIBLE THROMBOLYSIS VERSUS OPEN THROMBECTOMY AND REVSION OF RIGHT FEMORAL-POPLITEAL ARTERY BYPASS;  Surgeon: Sherren KernsFields, Charles E, [name];  [facility];  Service: Vascular;  Laterality: Right;  TUBAL LIGATION    VISCERAL ANGIOGRAPHY N/A 04/25/2019  Procedure: MESENTRIC   ANGIOGRAPHY;  Surgeon: Sherren KernsFields, Charles E, [name];  [facility];  Service: Cardiovascular;  Laterality: N/A;  WOUND DEBRIDEMENT Left 11/03/2019  Procedure: Debridement Wound of left above the knee amputation;  Surgeon: Sherren KernsFields, Charles E, [name];  [facility];  Service: Vascular;  Laterality: Left; HPI: 67 yo presenting 08/28/22 with L sided weakness, not speaking, R gaze preference. 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 Pt was evaluated by SLP in March 2023 and found to have mild-moderate dysarthria; cognitive-linguistic function and swallowing WFL. PMH includes: L  frontal CVA, GERD, sjogren's syndrome, B AKA, takotsubo CM, SLE, RA, PAD, MI, HTN, CAD, anxiety, depression, chiari malformation  Subjective: eager for POs  Recommendations for follow up therapy are one component of a multi-disciplinary discharge planning process, led by the attending physician.  Recommendations may be updated based on patient status, additional functional criteria and insurance authorization. Assessment / Plan / Recommendation   09/05/2022  12:00 PM Clinical Impressions Clinical Impression Pt has a moderate oropharyngeal dysphagia. She has L sided facial weakness impacting labial seal, with anterior loss of liquids on her L side, especially via cup. Her oral phase is sluggish at times and she has to use extra pumps to lingually propel boluses. Bolus cohesion is reduced and oral residue is noted. As she is work on pushing boluses posteriorly, thin and nectar thick liquids often spill as far as to the pyriform sinuses. When they reach that far, there is a timing issue for pt to be able to adequately protect her airway. Thin liquids are intermittently silently aspiration. Nectar thick liquids were penetrated more than aspirated, but when she aspirate, it was a larger volume. She did cough with this thicker and larger bolus, and it looked like she was able to clear most if not all of the barium from her trachea. She can  better coordinate her swallow with honey thick liquids and purees though and there is no airway invasion. Recommend Dys 2 diet and honey thick liquids for now. If pt can work on consistent small sips, facilitating timing and control, she may have good prognosis to advance. SLP Visit Diagnosis Dysphagia, oropharyngeal phase (R13.12) Impact on safety and function Moderate aspiration risk;Risk for inadequate nutrition/hydration     09/05/2022  12:00 PM Treatment Recommendations Treatment Recommendations Therapy as outlined in treatment plan below     09/05/2022  12:00 PM Prognosis Prognosis for Safe Diet Advancement Good Barriers to Reach Goals Severity of deficits   09/05/2022  12:00 PM Diet Recommendations SLP Diet Recommendations Dysphagia 2 (Fine chop) solids;Honey thick liquids Liquid Administration via Cup;Straw Medication Administration Crushed with puree Compensations Minimize environmental distractions;Slow rate;Small sips/bites;Lingual sweep for clearance of pocketing;Monitor for anterior loss Postural Changes Seated upright at 90 degrees     09/05/2022  12:00 PM Other Recommendations Oral Care Recommendations Oral care BID Other Recommendations Prohibited food (jello, ice cream, thin soups);Remove water pitcher Follow Up Recommendations Skilled nursing-short term rehab (<3 hours/day) Functional Status Assessment Patient has had a recent decline in their functional status and demonstrates the ability to make significant improvements in function in a reasonable and predictable amount of time.   09/05/2022  12:00 PM Frequency and Duration  Speech Therapy Frequency (ACUTE ONLY) min 2x/week Treatment Duration 2 weeks     09/05/2022  12:00 PM Oral Phase Oral Phase Impaired Oral - Honey Teaspoon Right anterior bolus loss;Weak lingual manipulation;Lingual/palatal residue;Reduced posterior propulsion Oral - Honey Cup Right anterior bolus loss;Weak lingual manipulation;Lingual/palatal residue;Reduced posterior propulsion;Delayed  oral transit;Decreased bolus cohesion Oral - Nectar Teaspoon Right anterior bolus loss;Weak lingual manipulation;Reduced posterior propulsion;Lingual/palatal residue;Delayed oral transit;Decreased bolus cohesion Oral - Nectar Straw Right anterior bolus loss;Weak lingual manipulation;Reduced posterior propulsion;Lingual/palatal residue;Delayed oral transit;Decreased bolus cohesion Oral - Thin Teaspoon Right anterior bolus loss;Weak lingual manipulation;Reduced posterior propulsion;Lingual/palatal residue;Delayed oral transit;Decreased bolus cohesion Oral - Thin Cup Right anterior bolus loss;Weak lingual manipulation;Reduced posterior propulsion;Lingual/palatal residue;Delayed oral transit;Decreased bolus cohesion Oral - Thin Straw Right anterior bolus loss;Weak lingual manipulation;Reduced posterior propulsion;Lingual/palatal residue;Delayed oral transit;Decreased bolus cohesion Oral - Puree Right anterior bolus  loss;Weak lingual manipulation;Lingual/palatal residue;Reduced posterior propulsion Oral - Regular Right anterior bolus loss;Weak lingual manipulation;Lingual/palatal residue;Reduced posterior propulsion;Impaired mastication    09/05/2022  12:00 PM Pharyngeal Phase Pharyngeal Phase Impaired Pharyngeal- Honey Teaspoon WFL Pharyngeal- Honey Cup WFL Pharyngeal- Nectar Teaspoon Delayed swallow initiation-pyriform sinuses;Penetration/Aspiration before swallow Pharyngeal Material enters airway, remains ABOVE vocal cords then ejected out Pharyngeal- Nectar Straw Delayed swallow initiation-pyriform sinuses;Penetration/Aspiration before swallow Pharyngeal Material enters airway, passes BELOW cords then ejected out Pharyngeal- Thin Teaspoon Delayed swallow initiation-pyriform sinuses;Penetration/Aspiration before swallow Pharyngeal Material enters airway, passes BELOW cords without attempt by patient to eject out (silent aspiration) Pharyngeal- Thin Cup Penetration/Aspiration before swallow;Delayed swallow  initiation-pyriform sinuses Pharyngeal Material enters airway, remains ABOVE vocal cords and not ejected out Pharyngeal- Thin Straw Reduced pharyngeal peristalsis;Penetration/Aspiration before swallow Pharyngeal Material enters airway, remains ABOVE vocal cords and not ejected out Pharyngeal- Puree [facility] Pharyngeal- Regular [facility]    09/05/2022  12:00 PM Cervical Esophageal Phase  Cervical Esophageal Phase [facility] Mercedes Menghini., M.A. CCC-SLP Acute Rehabilitation Services Office [phone] Secure chat preferred 09/05/2022, 1:21 PM                     CT HEAD WO CONTRAST ( )  Result Date: 09/02/2022 CLINICAL DATA:  Stroke workup EXAM: CT HEAD WITHOUT CONTRAST TECHNIQUE: Contiguous axial images were obtained from the base of the skull through the vertex without intravenous contrast. RADIATION DOSE REDUCTION: This exam was performed according to the departmental dose-optimization program which includes automated exposure control, adjustment of the mA and/or kV according to patient size and/or use of iterative reconstruction technique. COMPARISON:  Head CT from 4 days ago FINDINGS: Brain: Decrease in subarachnoid hemorrhage at the inter hemispheric fissure along the ACA vessels. Infarct with hemosiderin staining at the right basal ganglia; superimposed contrast staining on prior is resolved. No evidence of new infarct. Chronic white matter infarct in the left corona radiata. Low-density across the pons only in an area fact by streak artifact. Vascular: No acute finding Skull: Negative Sinuses/Orbits: Negative IMPRESSION: 1. Decreased interhemispheric subarachnoid hemorrhage. 2. Right basal ganglia infarct with petechial hemorrhage that is non progressed. 3. No new abnormality. Electronically Signed   By: Tiburcio Pea M.D.   On: 09/02/2022 06:29   DG Abd Portable 1V  Result Date: 08/30/2022 CLINICAL DATA:  Feeding tube placement. EXAM: PORTABLE ABDOMEN - 1 VIEW COMPARISON:  Abdominopelvic CT 01/28/2019 FINDINGS: Tip of the  weighted enteric tube is in the midline in the region of the distal stomach. There is question of excreted IV contrast within a dilated right renal collecting system. Nonobstructive occluded bowel-gas pattern. IMPRESSION: 1. Tip of the weighted enteric tube in the region of the distal stomach. 2. Question of excreted IV contrast within a dilated right renal collecting system. Consider renal ultrasound to assess for hydronephrosis. Electronically Signed   By: Narda Rutherford M.D.   On: 08/30/2022 16:12   CT HEAD WO CONTRAST ( )  Result Date: 08/29/2022 CLINICAL DATA:  Stroke, follow-up EXAM: CT HEAD WITHOUT CONTRAST TECHNIQUE: Contiguous axial images were obtained from the base of the skull through the vertex without intravenous contrast. RADIATION DOSE REDUCTION: This exam was performed according to the departmental dose-optimization program which includes automated exposure control, adjustment of the mA and/or kV according to patient size and/or use of iterative reconstruction technique. COMPARISON:  08/28/2022 CT head and post percutaneous intervention CT head FINDINGS: Brain: Hypodensity in the right basal ganglia, with mild central hyperdensity measuring 2.3 x 1.2 x 2.0 cm (AP x TR  x CC) (series 3, image 16), which may represent infarct with petechial hemorrhage versus contrast staining in an area of infarction, which appears similar in size to the postprocedural CT but decreased in density. Additional hyperdensity is seen at the lateral aspect of the right basal ganglia (series 3, image 17) and in the inter hemispheric fissure, series 3, image 13), similar to post procedural CT; this could also represent subarachnoid hemorrhage versus contrast staining. Mild edema is associated with the right basal ganglia, with mild local mass effect and effacement of the right lateral ventricle, but no significant midline shift. No evidence of additional acute infarct, mass, or hydrocephalus. Vascular: No hyperdense  vessel. Skull: No acute osseous abnormality. Sinuses/Orbits: Minimal mucosal thickening in the maxillary sinuses. No acute finding in the orbits. Other: Trace fluid in right mastoid air cells. IMPRESSION: 1. Hypodensity in the right basal ganglia, with mild central hyperdensity measuring 2.3 x 1.2 x 2.0 cm, likely acute infarct with petechial hemorrhage versus contrast staining. The hyperdense area appears similar in size to the post thrombectomy CT but decreased in density. Heidelberg classification 1b: HI2, confluent petechiae, no mass effect. 2. Additional hyperdensity at the lateral aspect of the right basal ganglia and in the interhemispheric fissure, similar to the postprocedural CT, which represent subarachnoid hemorrhage versus contrast staining. 3. Mild edema with narrowing of the right lateral ventricle but no significant midline shift. Electronically Signed   By: Wiliam Ke M.D.   On: 08/29/2022 17:42   IR PERCUTANEOUS ART THROMBECTOMY/INFUSION INTRACRANIAL INC DIAG ANGIO  Result Date: 08/29/2022 INDICATION: 67 year old female presenting with right gaze deviation left-sided weakness; NIHSS 19. Her last known well was 3:45 p.m. on 08/28/2022. Her past medical history significant for CAD, GERD, CVA, hypertension, hyperlipidemia, peripheral arterial disease with bilateral above-knee amputations, atrial fibrillation on Eliquis, AICD placement, Sjogren syndrome and cardiomyopathy; baseline modified Rankin scale 3. Head CT showed blurring of the right basal ganglia and insular cortex (ASPECTS 8). No IV trouble lytic administered due to patient on Eliquis. CT angiogram of the head and neck showed an occlusion of the right ICA terminus extending to the proximal A1/ACA as well as along the M1 and proximal M2/MCA segments. She was transferred to our service or mechanical thrombectomy. EXAM: ULTRASOUND-GUIDED VASCULAR ACCESS DIAGNOSTIC CEREBRAL ANGIOGRAM MECHANICAL THROMBECTOMY FLAT PANEL HEAD CT COMPARISON:   CT/CT angiogram of the neck August 28, 2022. MEDICATIONS: No antibiotics utilized. ANESTHESIA/SEDATION: The procedure was under general anesthesia. CONTRAST:  60 mL Omnipaque 300 mg per. FLUOROSCOPY: Radiation Exposure Index (as provided by the fluoroscopic device): 1427 mGy Kerma COMPLICATIONS: None immediate. TECHNIQUE: Informed written consent was obtained from the patient's daughter after a thorough discussion of the procedural risks, benefits and alternatives. All questions were addressed. Maximal Sterile Barrier Technique was utilized including caps, mask, sterile gowns, sterile gloves, sterile drape, hand hygiene and skin antiseptic. A timeout was performed prior to the initiation of the procedure. The right groin was prepped and draped in the usual sterile fashion. Using a micropuncture kit and the modified Seldinger technique, access was gained to the right common femoral artery and an 8 French sheath was placed. Real-time ultrasound guidance was utilized for vascular access including the acquisition of a permanent ultrasound image documenting patency of the accessed vessel. Under fluoroscopy, a Zoom 88 guide catheter was navigated over a 6 [place] Berenstein 2 catheter and a 0.035" Terumo Glidewire into the aortic arch. The catheter was placed into the right common carotid artery and then advanced into the right internal  carotid artery. The diagnostic catheter was removed. Frontal and lateral angiograms of the head were obtained. FINDINGS: 1. Increased wall thickness of the right common femoral artery seen on ultrasound. Clot is seen occluding the distal right common femoral artery. The proximal aspect of the arteries patent, adequate for vascular access. 2. Occlusion of the intracranial right ICA at the communicating segment. PROCEDURE: Using biplane roadmap guidance, a zoom 71 aspiration catheter was navigated over Colossus 35 microguidewire into the cavernous segment of the right ICA. The aspiration  catheter was then advanced to the M1 segment and connected to an aspiration pump. Continuous aspiration was performed for 2 minutes. The guide catheter was connected to a VacLok syringe. The aspiration catheter was subsequently removed under constant aspiration. The guide catheter was aspirated for debris. Right internal carotid artery angiograms with frontal and lateral views of the head showed recanalization of the intracranial right ICA proximal M1/MCA and A1/ACA. Occlusion is noted at the mid M1/MCA segment and A3-A4/ACA. Using biplane roadmap guidance, a zoom 71 aspiration catheter was navigated over Colossus 35 microguidewire into the cavernous segment of the right ICA. The aspiration catheter was then advanced to the level of occlusion in the M2 segment and connected to an aspiration pump. Continuous aspiration was performed for 2 minutes. The guide catheter was connected to a VacLok syringe. The aspiration catheter was subsequently removed under constant aspiration. The guide catheter was aspirated for debris. Right internal carotid artery angiograms with frontal and lateral views of the head to recanalization of the M1 segment and M2 posterior division branches. Nonocclusive clot is seen at the distal M1 segment with persistent occlusion of the anterior division branch. Using biplane roadmap guidance, a zoom 55 aspiration catheter was navigated over a phenom 21 microcatheter and an Aristotle 14 microguidewire into the cavernous segment of the right ICA. The microcatheter was then navigated over the wire into the right M2/MCA. Then, a 3 x 40 mm solitaire stent retriever was deployed spanning the M1 and M2 segments. The device was allowed to intercalated with the clot for 4 minutes. The microcatheter was removed. The aspiration catheter was advanced to the level of occlusion and connected to an aspiration pump. The thrombectomy device and aspiration catheter were removed under constant aspiration. The guide  catheter was aspirated for debris. Right internal carotid artery angiograms with frontal and lateral views of the head showed complete recanalization of the right MCA vascular tree. Underlying severe stenosis at the origin of an M2 anterior division branch was seen. Persistent occlusion at the A3-A4/ACA noted. Using biplane roadmap guidance, a zoom 55 aspiration catheter was navigated over a phenom 21 microcatheter and an Aristotle 14 microguidewire into the cavernous segment of the right ICA. The microcatheter was then navigated over the wire into the distal right pericallosal artery. Then, a 3 x 40 mm solitaire stent retriever was deployed spanning the A3 and A4 segments. The device was allowed to intercalated with the clot for 4 minutes. The microcatheter was removed. The aspiration catheter was advanced to the A2 level and connected to an aspiration pump. The thrombectomy device and aspiration catheter were removed under constant aspiration. The guide catheter was aspirated for debris. Right internal carotid artery angiograms with frontal lateral views of the head showed recanalization of the A3 segment with persistent occlusion of the A4 segment. Using biplane roadmap guidance, a zoom 55 aspiration catheter was navigated over a phenom 21 microcatheter and an Aristotle 14 microguidewire into the cavernous segment of the right ICA.  The microcatheter was then navigated over the wire into the distal right pericallosal artery. Then, a 3 x 40 mm solitaire stent retriever was deployed spanning the A3 and A4 segments. The device was allowed to intercalated with the clot for 4 minutes. The microcatheter was removed. The aspiration catheter was advanced to the A2 level and connected to an aspiration pump. The thrombectomy device and aspiration catheter were removed under constant aspiration. The guide catheter was aspirated for debris. Right internal carotid artery angiograms with frontal and lateral views of the head  showed complete recanalization of the right ACA vascular tree. No evidence of embolus to new territory. Flat panel CT of the head was obtained and post processed in a separate workstation with concurrent attending physician supervision. Selected images were sent to PACS. Contrast staining of the right basal ganglia is noted, suggesting ongoing ischemia. Trace hyperdensity are noted in the interhemispheric fissure and right sylvian fissure which could represent contrast staining versus small subarachnoid hemorrhage. The catheter was subsequently withdrawn. Right common femoral artery angiogram was obtained in right anterior oblique view. The puncture is at the level of the common femoral artery. The artery is patent at and above the level of puncture with appropriate caliber for closure device. The sheath was exchanged over the wire for a Perclose prostyle which was utilized for access closure. Immediate hemostasis was achieved. IMPRESSION: Successful mechanical thrombectomy for treatment of an intracranial right ICA "T" occlusion. Complete recanalization of the right ICA, MCA and ACA vascular trees achieved (TICI 3). PLAN: Patient was transferred to ICU in stable condition. Electronically Signed   By: Baldemar Lenis M.D.   On: 08/29/2022 16:37   IR US Guide Vasc Access Right  Result Date: 08/29/2022 INDICATION: 67 year old female presenting with right gaze deviation left-sided weakness; NIHSS 19. Her last known well was 3:45 p.m. on 08/28/2022. Her past medical history significant for CAD, GERD, CVA, hypertension, hyperlipidemia, peripheral arterial disease with bilateral above-knee amputations, atrial fibrillation on Eliquis, AICD placement, Sjogren syndrome and cardiomyopathy; baseline modified Rankin scale 3. Head CT showed blurring of the right basal ganglia and insular cortex (ASPECTS 8). No IV trouble lytic administered due to patient on Eliquis. CT angiogram of the head and neck showed an  occlusion of the right ICA terminus extending to the proximal A1/ACA as well as along the M1 and proximal M2/MCA segments. She was transferred to our service or mechanical thrombectomy. EXAM: ULTRASOUND-GUIDED VASCULAR ACCESS DIAGNOSTIC CEREBRAL ANGIOGRAM MECHANICAL THROMBECTOMY FLAT PANEL HEAD CT COMPARISON:  CT/CT angiogram of the neck August 28, 2022. MEDICATIONS: No antibiotics utilized. ANESTHESIA/SEDATION: The procedure was under general anesthesia. CONTRAST:  60 mL Omnipaque 300 mg per. FLUOROSCOPY: Radiation Exposure Index (as provided by the fluoroscopic device): 1427 mGy Kerma COMPLICATIONS: None immediate. TECHNIQUE: Informed written consent was obtained from the patient's daughter after a thorough discussion of the procedural risks, benefits and alternatives. All questions were addressed. Maximal Sterile Barrier Technique was utilized including caps, mask, sterile gowns, sterile gloves, sterile drape, hand hygiene and skin antiseptic. A timeout was performed prior to the initiation of the procedure. The right groin was prepped and draped in the usual sterile fashion. Using a micropuncture kit and the modified Seldinger technique, access was gained to the right common femoral artery and an 8 French sheath was placed. Real-time ultrasound guidance was utilized for vascular access including the acquisition of a permanent ultrasound image documenting patency of the accessed vessel. Under fluoroscopy, a Zoom 88 guide catheter was navigated over  a 6 [place] Berenstein 2 catheter and a 0.035" Terumo Glidewire into the aortic arch. The catheter was placed into the right common carotid artery and then advanced into the right internal carotid artery. The diagnostic catheter was removed. Frontal and lateral angiograms of the head were obtained. FINDINGS: 1. Increased wall thickness of the right common femoral artery seen on ultrasound. Clot is seen occluding the distal right common femoral artery. The proximal  aspect of the arteries patent, adequate for vascular access. 2. Occlusion of the intracranial right ICA at the communicating segment. PROCEDURE: Using biplane roadmap guidance, a zoom 71 aspiration catheter was navigated over Colossus 35 microguidewire into the cavernous segment of the right ICA. The aspiration catheter was then advanced to the M1 segment and connected to an aspiration pump. Continuous aspiration was performed for 2 minutes. The guide catheter was connected to a VacLok syringe. The aspiration catheter was subsequently removed under constant aspiration. The guide catheter was aspirated for debris. Right internal carotid artery angiograms with frontal and lateral views of the head showed recanalization of the intracranial right ICA proximal M1/MCA and A1/ACA. Occlusion is noted at the mid M1/MCA segment and A3-A4/ACA. Using biplane roadmap guidance, a zoom 71 aspiration catheter was navigated over Colossus 35 microguidewire into the cavernous segment of the right ICA. The aspiration catheter was then advanced to the level of occlusion in the M2 segment and connected to an aspiration pump. Continuous aspiration was performed for 2 minutes. The guide catheter was connected to a VacLok syringe. The aspiration catheter was subsequently removed under constant aspiration. The guide catheter was aspirated for debris. Right internal carotid artery angiograms with frontal and lateral views of the head to recanalization of the M1 segment and M2 posterior division branches. Nonocclusive clot is seen at the distal M1 segment with persistent occlusion of the anterior division branch. Using biplane roadmap guidance, a zoom 55 aspiration catheter was navigated over a phenom 21 microcatheter and an Aristotle 14 microguidewire into the cavernous segment of the right ICA. The microcatheter was then navigated over the wire into the right M2/MCA. Then, a 3 x 40 mm solitaire stent retriever was deployed spanning the M1  and M2 segments. The device was allowed to intercalated with the clot for 4 minutes. The microcatheter was removed. The aspiration catheter was advanced to the level of occlusion and connected to an aspiration pump. The thrombectomy device and aspiration catheter were removed under constant aspiration. The guide catheter was aspirated for debris. Right internal carotid artery angiograms with frontal and lateral views of the head showed complete recanalization of the right MCA vascular tree. Underlying severe stenosis at the origin of an M2 anterior division branch was seen. Persistent occlusion at the A3-A4/ACA noted. Using biplane roadmap guidance, a zoom 55 aspiration catheter was navigated over a phenom 21 microcatheter and an Aristotle 14 microguidewire into the cavernous segment of the right ICA. The microcatheter was then navigated over the wire into the distal right pericallosal artery. Then, a 3 x 40 mm solitaire stent retriever was deployed spanning the A3 and A4 segments. The device was allowed to intercalated with the clot for 4 minutes. The microcatheter was removed. The aspiration catheter was advanced to the A2 level and connected to an aspiration pump. The thrombectomy device and aspiration catheter were removed under constant aspiration. The guide catheter was aspirated for debris. Right internal carotid artery angiograms with frontal lateral views of the head showed recanalization of the A3 segment with persistent occlusion of  the A4 segment. Using biplane roadmap guidance, a zoom 55 aspiration catheter was navigated over a phenom 21 microcatheter and an Aristotle 14 microguidewire into the cavernous segment of the right ICA. The microcatheter was then navigated over the wire into the distal right pericallosal artery. Then, a 3 x 40 mm solitaire stent retriever was deployed spanning the A3 and A4 segments. The device was allowed to intercalated with the clot for 4 minutes. The microcatheter was  removed. The aspiration catheter was advanced to the A2 level and connected to an aspiration pump. The thrombectomy device and aspiration catheter were removed under constant aspiration. The guide catheter was aspirated for debris. Right internal carotid artery angiograms with frontal and lateral views of the head showed complete recanalization of the right ACA vascular tree. No evidence of embolus to new territory. Flat panel CT of the head was obtained and post processed in a separate workstation with concurrent attending physician supervision. Selected images were sent to PACS. Contrast staining of the right basal ganglia is noted, suggesting ongoing ischemia. Trace hyperdensity are noted in the interhemispheric fissure and right sylvian fissure which could represent contrast staining versus small subarachnoid hemorrhage. The catheter was subsequently withdrawn. Right common femoral artery angiogram was obtained in right anterior oblique view. The puncture is at the level of the common femoral artery. The artery is patent at and above the level of puncture with appropriate caliber for closure device. The sheath was exchanged over the wire for a Perclose prostyle which was utilized for access closure. Immediate hemostasis was achieved. IMPRESSION: Successful mechanical thrombectomy for treatment of an intracranial right ICA "T" occlusion. Complete recanalization of the right ICA, MCA and ACA vascular trees achieved (TICI 3). PLAN: Patient was transferred to ICU in stable condition. Electronically Signed   By: Pedro Earls M.D.   On: 08/29/2022 16:37   IR CT Head Ltd  Result Date: 08/29/2022 INDICATION: 67 year old female presenting with right gaze deviation left-sided weakness; NIHSS 19. Her last known well was 3:45 p.m. on 08/28/2022. Her past medical history significant for CAD, GERD, CVA, hypertension, hyperlipidemia, peripheral arterial disease with bilateral above-knee amputations, atrial  fibrillation on Eliquis, AICD placement, Sjogren syndrome and cardiomyopathy; baseline modified Rankin scale 3. Head CT showed blurring of the right basal ganglia and insular cortex (ASPECTS 8). No IV trouble lytic administered due to patient on Eliquis. CT angiogram of the head and neck showed an occlusion of the right ICA terminus extending to the proximal A1/ACA as well as along the M1 and proximal M2/MCA segments. She was transferred to our service or mechanical thrombectomy. EXAM: ULTRASOUND-GUIDED VASCULAR ACCESS DIAGNOSTIC CEREBRAL ANGIOGRAM MECHANICAL THROMBECTOMY FLAT PANEL HEAD CT COMPARISON:  CT/CT angiogram of the neck August 28, 2022. MEDICATIONS: No antibiotics utilized. ANESTHESIA/SEDATION: The procedure was under general anesthesia. CONTRAST:  60 mL Omnipaque 300 mg per. FLUOROSCOPY: Radiation Exposure Index (as provided by the fluoroscopic device): 6948 mGy Kerma COMPLICATIONS: None immediate. TECHNIQUE: Informed written consent was obtained from the patient's daughter after a thorough discussion of the procedural risks, benefits and alternatives. All questions were addressed. Maximal Sterile Barrier Technique was utilized including caps, mask, sterile gowns, sterile gloves, sterile drape, hand hygiene and skin antiseptic. A timeout was performed prior to the initiation of the procedure. The right groin was prepped and draped in the usual sterile fashion. Using a micropuncture kit and the modified Seldinger technique, access was gained to the right common femoral artery and an 8 French sheath was placed. Real-time ultrasound  guidance was utilized for vascular access including the acquisition of a permanent ultrasound image documenting patency of the accessed vessel. Under fluoroscopy, a Zoom 88 guide catheter was navigated over a 6 [place] Berenstein 2 catheter and a 0.035" Terumo Glidewire into the aortic arch. The catheter was placed into the right common carotid artery and then advanced into the  right internal carotid artery. The diagnostic catheter was removed. Frontal and lateral angiograms of the head were obtained. FINDINGS: 1. Increased wall thickness of the right common femoral artery seen on ultrasound. Clot is seen occluding the distal right common femoral artery. The proximal aspect of the arteries patent, adequate for vascular access. 2. Occlusion of the intracranial right ICA at the communicating segment. PROCEDURE: Using biplane roadmap guidance, a zoom 71 aspiration catheter was navigated over Colossus 35 microguidewire into the cavernous segment of the right ICA. The aspiration catheter was then advanced to the M1 segment and connected to an aspiration pump. Continuous aspiration was performed for 2 minutes. The guide catheter was connected to a VacLok syringe. The aspiration catheter was subsequently removed under constant aspiration. The guide catheter was aspirated for debris. Right internal carotid artery angiograms with frontal and lateral views of the head showed recanalization of the intracranial right ICA proximal M1/MCA and A1/ACA. Occlusion is noted at the mid M1/MCA segment and A3-A4/ACA. Using biplane roadmap guidance, a zoom 71 aspiration catheter was navigated over Colossus 35 microguidewire into the cavernous segment of the right ICA. The aspiration catheter was then advanced to the level of occlusion in the M2 segment and connected to an aspiration pump. Continuous aspiration was performed for 2 minutes. The guide catheter was connected to a VacLok syringe. The aspiration catheter was subsequently removed under constant aspiration. The guide catheter was aspirated for debris. Right internal carotid artery angiograms with frontal and lateral views of the head to recanalization of the M1 segment and M2 posterior division branches. Nonocclusive clot is seen at the distal M1 segment with persistent occlusion of the anterior division branch. Using biplane roadmap guidance, a zoom 55  aspiration catheter was navigated over a phenom 21 microcatheter and an Aristotle 14 microguidewire into the cavernous segment of the right ICA. The microcatheter was then navigated over the wire into the right M2/MCA. Then, a 3 x 40 mm solitaire stent retriever was deployed spanning the M1 and M2 segments. The device was allowed to intercalated with the clot for 4 minutes. The microcatheter was removed. The aspiration catheter was advanced to the level of occlusion and connected to an aspiration pump. The thrombectomy device and aspiration catheter were removed under constant aspiration. The guide catheter was aspirated for debris. Right internal carotid artery angiograms with frontal and lateral views of the head showed complete recanalization of the right MCA vascular tree. Underlying severe stenosis at the origin of an M2 anterior division branch was seen. Persistent occlusion at the A3-A4/ACA noted. Using biplane roadmap guidance, a zoom 55 aspiration catheter was navigated over a phenom 21 microcatheter and an Aristotle 14 microguidewire into the cavernous segment of the right ICA. The microcatheter was then navigated over the wire into the distal right pericallosal artery. Then, a 3 x 40 mm solitaire stent retriever was deployed spanning the A3 and A4 segments. The device was allowed to intercalated with the clot for 4 minutes. The microcatheter was removed. The aspiration catheter was advanced to the A2 level and connected to an aspiration pump. The thrombectomy device and aspiration catheter were removed under constant  aspiration. The guide catheter was aspirated for debris. Right internal carotid artery angiograms with frontal lateral views of the head showed recanalization of the A3 segment with persistent occlusion of the A4 segment. Using biplane roadmap guidance, a zoom 55 aspiration catheter was navigated over a phenom 21 microcatheter and an Aristotle 14 microguidewire into the cavernous segment of  the right ICA. The microcatheter was then navigated over the wire into the distal right pericallosal artery. Then, a 3 x 40 mm solitaire stent retriever was deployed spanning the A3 and A4 segments. The device was allowed to intercalated with the clot for 4 minutes. The microcatheter was removed. The aspiration catheter was advanced to the A2 level and connected to an aspiration pump. The thrombectomy device and aspiration catheter were removed under constant aspiration. The guide catheter was aspirated for debris. Right internal carotid artery angiograms with frontal and lateral views of the head showed complete recanalization of the right ACA vascular tree. No evidence of embolus to new territory. Flat panel CT of the head was obtained and post processed in a separate workstation with concurrent attending physician supervision. Selected images were sent to PACS. Contrast staining of the right basal ganglia is noted, suggesting ongoing ischemia. Trace hyperdensity are noted in the interhemispheric fissure and right sylvian fissure which could represent contrast staining versus small subarachnoid hemorrhage. The catheter was subsequently withdrawn. Right common femoral artery angiogram was obtained in right anterior oblique view. The puncture is at the level of the common femoral artery. The artery is patent at and above the level of puncture with appropriate caliber for closure device. The sheath was exchanged over the wire for a Perclose prostyle which was utilized for access closure. Immediate hemostasis was achieved. IMPRESSION: Successful mechanical thrombectomy for treatment of an intracranial right ICA "T" occlusion. Complete recanalization of the right ICA, MCA and ACA vascular trees achieved (TICI 3). PLAN: Patient was transferred to ICU in stable condition. Electronically Signed   By: Baldemar Lenis M.D.   On: 08/29/2022 16:37   ECHOCARDIOGRAM COMPLETE  Result Date: 08/29/2022     ECHOCARDIOGRAM REPORT   Patient Name:   Mercedes [name] Date of Exam: 08/29/2022 Medical Rec #:  [id number]         Height:       65.0 in Accession #:    [id number]        Weight:       95.7 lb Date of Birth:  March 04, 1956         BSA:          1.446 m Patient Age:    66 years          BP:           124/70 mmHg Patient Gender: F                 HR:           73 bpm. Exam Location:  Inpatient Procedure: 2D Echo, Color Doppler, Cardiac Doppler and Intracardiac            Opacification Agent Indications:    Stroke I63.9  History:        Patient has prior history of Echocardiogram examinations, most                 recent 11/22/2021. Cardiomyopathy, CAD and Previous Myocardial                 Infarction, Stroke and PAD, Arrythmias:Atrial Fibrillation;  Risk                 Factors:Dyslipidemia, Hypertension and Former Smoker.  Sonographer:    Aron Baba Referring Phys: [id number] [facility]  Sonographer Comments: Suboptimal apical window. Image acquisition challenging due to patient body habitus and Image acquisition challenging due to respiratory motion. IMPRESSIONS  1. Left ventricular ejection fraction, by estimation, is 55 to 60%. The left ventricle has normal function. The left ventricle demonstrates regional wall motion abnormalities (see scoring diagram/findings for description). Left ventricular diastolic parameters are consistent with Grade I diastolic dysfunction (impaired relaxation). There is moderate hypokinesis of the left ventricular, apical inferior wall and septal wall.  2. Right ventricular systolic function is normal. The right ventricular size is normal.  3. The mitral valve is normal in structure. No evidence of mitral valve regurgitation. No evidence of mitral stenosis.  4. The aortic valve is normal in structure. Aortic valve regurgitation is not visualized. No aortic stenosis is present.  5. The inferior vena cava is normal in size with greater than 50% respiratory variability, suggesting right  atrial pressure of 3 mmHg. Comparison(s): Prior images reviewed side by side. The left ventricular wall motion abnormality is new. However, it fits well with the nuclear perfusion defect described 09/02/2020. Suspect the wall motion abnormality is actually old, just better defined  on today's study due to the use of Definity contrast FINDINGS  Left Ventricle: No left ventricular thrombus is seen (Definity contrast used). Left ventricular ejection fraction, by estimation, is 55 to 60%. The left ventricle has normal function. The left ventricle demonstrates regional wall motion abnormalities. Moderate hypokinesis of the left ventricular, apical apical segment, inferior wall and septal wall. Definity contrast agent was given IV to delineate the left ventricular endocardial borders. The left ventricular internal cavity size was normal in size. There is no left ventricular hypertrophy. Left ventricular diastolic parameters are consistent with Grade I diastolic dysfunction (impaired relaxation). Normal left ventricular filling pressure.  LV Wall Scoring: The apical septal segment, apical anterior segment, apical inferior segment, and apex are hypokinetic. Right Ventricle: The right ventricular size is normal. No increase in right ventricular wall thickness. Right ventricular systolic function is normal. Left Atrium: Left atrial size was normal in size. Right Atrium: Right atrial size was normal in size. Pericardium: There is no evidence of pericardial effusion. Mitral Valve: The mitral valve is normal in structure. No evidence of mitral valve regurgitation. No evidence of mitral valve stenosis. Tricuspid Valve: The tricuspid valve is normal in structure. Tricuspid valve regurgitation is not demonstrated. No evidence of tricuspid stenosis. Aortic Valve: The aortic valve is normal in structure. Aortic valve regurgitation is not visualized. No aortic stenosis is present. Pulmonic Valve: The pulmonic valve was normal in  structure. Pulmonic valve regurgitation is not visualized. No evidence of pulmonic stenosis. Aorta: The aortic root is normal in size and structure. Venous: The inferior vena cava is normal in size with greater than 50% respiratory variability, suggesting right atrial pressure of 3 mmHg. IAS/Shunts: No atrial level shunt detected by color flow Doppler.  LEFT VENTRICLE PLAX 2D LVIDd:         4.10 cm   Diastology LVIDs:         3.50 cm   LV e' medial:    5.22 cm/s LV PW:         1.80 cm   LV E/e' medial:  11.5 LV IVS:        0.80 cm  LV e' lateral:   6.85 cm/s LVOT diam:     2.10 cm   LV E/e' lateral: 8.8 LV SV:         68 LV SV Index:   47 LVOT Area:     3.46 cm  RIGHT VENTRICLE RV S prime:     9.36 cm/s TAPSE (M-mode): 1.9 cm LEFT ATRIUM             Index        RIGHT ATRIUM           Index LA diam:        2.60 cm 1.80 cm/m   RA Area:     14.70 cm LA Vol (A2C):   86.3 ml 59.68 ml/m  RA Volume:   33.80 ml  23.37 ml/m LA Vol (A4C):   59.0 ml 40.80 ml/m LA Biplane Vol: 76.6 ml 52.97 ml/m  AORTIC VALVE             PULMONIC VALVE LVOT Vmax:   94.00 cm/s  PR End Diast Vel: 3.86 msec LVOT Vmean:  56.200 cm/s LVOT VTI:    0.197 m  AORTA Ao Root diam: 3.50 cm MITRAL VALVE MV Area (PHT): 3.17 cm    SHUNTS MV Decel Time: 239 msec    Systemic VTI:  0.20 m MR Peak grad: 2.7 mmHg     Systemic Diam: 2.10 cm MR Vmax:      82.50 cm/s MV E velocity: 60.00 cm/s MV A velocity: 64.40 cm/s MV E/A ratio:  0.93 Mercedes [name] Electronically signed by Thurmon Fair [name] Signature Date/Time: 08/29/2022/3:27:24 PM    Final    CT ANGIO HEAD NECK W WO CM (CODE STROKE)  Result Date: 08/28/2022 CLINICAL DATA:  Code stroke. Patient found with left-sided motor weakness and right gaze deviation. EXAM: CT ANGIOGRAPHY HEAD AND NECK TECHNIQUE: Multidetector CT imaging of the head and neck was performed using the standard protocol during bolus administration of intravenous contrast. Multiplanar CT image reconstructions and MIPs were obtained  to evaluate the vascular anatomy. Carotid stenosis measurements (when applicable) are obtained utilizing NASCET criteria, using the distal internal carotid diameter as the denominator. RADIATION DOSE REDUCTION: This exam was performed according to the departmental dose-optimization program which includes automated exposure control, adjustment of the mA and/or kV according to patient size and/or use of iterative reconstruction technique. CONTRAST:  75mL OMNIPAQUE IOHEXOL 350 MG/ML SOLN COMPARISON:  CTA of the head and neck 11/21/2021 FINDINGS: CTA NECK FINDINGS Aortic arch: A common origin of the left common carotid artery and innominate artery is noted. No significant stenosis is present at the arch. Right carotid system: The right common carotid artery is within normal limits. Atherosclerotic calcifications are present at the bifurcation without a significant stenosis. High-grade stenosis is present in the right external carotid artery. The right internal carotid artery narrows in the neck and is occluded in its distal segment. There is no reconstitution prior to the skull base. Left carotid system: The left common carotid artery is within normal limits. Atherosclerotic changes are present at the carotid bifurcation. The proximal left ICA is narrowed to 1.5 mm. More distal left ICA is normal. It measures 3.8 mm. Vertebral arteries: The vertebral arteries are codominant. Both vertebral arteries originate from the subclavian arteries without significant stenosis. No significant stenosis is present in either vertebral artery in the neck. Skeleton: Mild degenerative changes of the cervical spine are stable. No focal osseous lesions are present. Other neck: Soft tissues the  neck are otherwise unremarkable. Salivary glands are within normal limits. Thyroid is normal. No significant adenopathy is present. No focal mucosal or submucosal lesions are present. Upper chest: Centrilobular emphysematous changes are present at  the lung apices. No nodule or mass lesion is present. No focal airspace disease is present. Interlobular septal thickening is consistent with edema. Review of the MIP images confirms the above findings CTA HEAD FINDINGS Anterior circulation: The right internal carotid artery is occluded. Atherosclerotic calcifications are present in the cavernous left internal carotid artery without significant stenosis. The left A1 and M1 segments are normal. The anterior communicating artery is patent. Both A2 segments fill from the left. The left MCA bifurcation is within normal limits. There is some segmental narrowing in distal left MCA branch vessels without a significant proximal stenosis or occlusion. Minimal filling of distal right MCA branch vessels is present with some collaterals. The right M1 and A1 segments are occluded. Posterior circulation: The PICA origins are visualized and normal bilaterally. Vertebrobasilar junction and basilar artery are normal. The right posterior cerebral artery originates from the basilar tip. The left posterior cerebral artery is of fetal type. PCA branch vessels are normal bilaterally. Venous sinuses: The dural sinuses are patent. The straight sinus and deep cerebral veins are intact. Cortical veins are within normal limits. No significant vascular malformation is evident. Anatomic variants: Fetal type left posterior cerebral artery. Review of the MIP images confirms the above findings IMPRESSION: 1. Occlusion of the right internal carotid artery in its distal segment without reconstitution prior to the skull base. 2. The right ICA is occluded through the ICA terminus. Right M1 and A1 segments are occluded. 3. Minimal filling of distal right MCA branch vessels with some collaterals. 4. Atherosclerotic changes at the right carotid bifurcation and cavernous left internal carotid artery without significant stenosis. 5. Fetal type left posterior cerebral artery. 6. Probable pulmonary edema. 7.   Emphysema (ICD10-J43.9). Electronically Signed   By: Marin Roberts M.D.   On: 08/28/2022 18:18   CT HEAD CODE STROKE WO CONTRAST  Result Date: 08/28/2022 CLINICAL DATA:  Code stroke. Found nonverbal with right-sided gaze and left-sided motor weakness. EXAM: CT HEAD WITHOUT CONTRAST TECHNIQUE: Contiguous axial images were obtained from the base of the skull through the vertex without intravenous contrast. RADIATION DOSE REDUCTION: This exam was performed according to the departmental dose-optimization program which includes automated exposure control, adjustment of the mA and/or kV according to patient size and/or use of iterative reconstruction technique. COMPARISON:  CT head and CTA head and neck 11/21/2021 FINDINGS: Brain: Periventricular white matter disease is stable. Slight asymmetric hypoattenuation is present in the right lentiform nucleus and insular ribbon. Caudate head and internal capsule are within normal limits. No other cortical lesions are evident. The ventricles are of normal size. No significant extraaxial fluid collection is present. The brainstem and cerebellum are within normal limits. Vascular: Atherosclerotic calcifications are present within the cavernous internal carotid arteries bilaterally. Asymmetric hyperdensity is present in the right M1 segment. Skull: Calvarium is intact. No focal lytic or blastic lesions are present. No significant extracranial soft tissue lesion is present. Sinuses/Orbits: The paranasal sinuses and mastoid air cells are clear. The globes and orbits are within normal limits. ASPECTS [facility] Stroke Program Early CT Score) - Ganglionic level infarction (caudate, lentiform nuclei, internal capsule, insula, M1-M3 cortex): 5/7 - Supraganglionic infarction (M4-M6 cortex): 3/3 Total score (0-10 with 10 being normal): 8/10 IMPRESSION: 1. Asymmetric hyperdensity in the right M1 segment concerning for acute thrombus.  2. Slight asymmetric hypoattenuation in the right  lentiform nucleus and insular ribbon. This could represent early infarct. 3. Aspects is 8/10. 4. Stable mild periventricular white matter disease. This likely reflects the sequela of chronic microvascular ischemia. The above was relayed via text pager to Dr. Rollene Fare on 08/28/2022 at 18:06 . Electronically Signed   By: Marin Roberts M.D.   On: 08/28/2022 18:06      HISTORY OF PRESENT ILLNESS KAMILA BRODA is a 67 y.o. female with a past medical history of CAD, GERD, CVA, HTN, HLD, PAD with bilateral AKA, afib on eliquis, AICD placement, sjogren's syndrome, and cardiomyopathy who is presenting with right gaze deviation and left side weakness. She was last seen by family at 52 after she finished cooking for herself. She was taken from the bridge to CT for stat CT and CT Angio. CTA shows a right ICA occlusion and she was emergently taken to IR for a mechanical thrombectomy.  Family states that she is largely independent, manages her own medications, and is able to dress and bathe herself. She does have an aide who assists with her some cleaning. She is currently in PT and in the process of obtaining new prosthetics.   Notably her last admission was in March 2023 in the setting of medication nonadherence to Eliquis at which time she presented with right-sided weakness and speech difficulty, resolved per family   LKW: 1500 tpa given?: no, on eliquis Premorbid modified Rankin scale (mRS): 3-Moderate disability-requires help but walks WITHOUT assistance  NIR: Yes, R ICA occlusion. Consent obtained from daughter (Mercedes [name])  HOSPITAL COURSE Ms. THANH POMERLEAU is a 67 y.o. female with history of CAD, GERD, CVA in March of 2023, HTN, HLD, PAD with bilateral AKA, AF on Eliquis, AICD, Sjogren's syndrome, and cardiomyopathy presenting with right gaze deviation and left-sided weakness. LKW 08/28/22 15:45.    Stroke: Acute right hemispheric infarct due to acute right ICA terminus  occlusion.  Etiology is likely embolic secondary to atrial fibrillation s/p IR with TICI3 Code Stroke right M1 segment concerning for acute thrombus.  Slight asymmetric hypoattenuation in the right lentiform nucleus and insular ribbon.  This could represent early infarct. ASPECTS 8/10.   CTA head & neck occlusion of the right ICA in its distal segment without reconstitution prior to the skull base.  The right ICA is occluded through the ICA terminus.  Right M1 and A1 segments are occluded.  Minimal filling of distal right MCA branch vessels with some collaterals.   IR with ICA, MCA, and ACA achieving complete recanalization.   CT 1/2 right MCA infarct with petechial hemorrhage, interhemispheric SAH CT 1/5 decreased interhemispheric SAH, right BG infarct with petechial hemorrhage, stable. CT 1/11 stable right hemispheric infarct with no hemorrhagic transformation, virtually resolved subarachnoid hemorrhage MRI unable to obtain due to PPM. 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  Patient not on AED. 10/2021 presenting with speech disturbance and right upper extremity weakness.  CT showed left large subcortical infarct.  CTA head and neck left ICA 60% stenosis.  Carotid Doppler left ICA 40 to 59% stenosis.  EF 55 to 60%.  LDL 86, A1c 6.2. It was felt that her stroke was due to PAF with Eliquis nonadherence as patient stopped taking AC  as she perceived "it was not doing anything for me". Discharged on Eliquis and Crestor 40. Patient had residual dysarthria and improving RUE weakness   Dysphagia MBS- done 1/9- dysphagia 2 with honey thick liquids Cortrak in place with TF On nocturnal feeding to facilitate po intake   Paroxysmal atrial  fibrillation Previously noncompliant with CVA in 10/2021 Patient endorses current compliance with Eliquis  Repeat head CT looks good with virtual resolution of subarachnoid hemorrhage, Eliquis restarted   Hypertension Home meds: Lopressor Stable  On Norvasc 10  long-term BP goal normotensive   Hyperlipidemia Home meds: Crestor 40 mg, resumed in hospital LDL 82, goal < 70 Continue statin at discharge   PAD status post bilateral AKA Right leg pain Right leg warm, no sign of ischemic limb  pain likely due to PAD versus lying in the bed without being up and about with her prosthesis as she usually is. Continue gabapentin 100->200-> 300 tid Schedule acetaminophen which appears to help   Other Stroke Risk Factors Advanced Age >/= 72  Former cigarette smoker Coronary artery disease   Other Active Problems SSS status post pacemaker Sjogren syndrome Constipation - bowel regimen - fleet enema effective   DISCHARGE EXAM Blood pressure 122/84, pulse 79, temperature 99.1 F (37.3 C), temperature source Oral, resp. rate 16, weight 43.6 kg, SpO2 97 %. General -frail elderly, in no apparent distress. HEENT- atraumatic, normocephalic  Cardiovascular - Regular rhythm and rate. Pulmonary- equal chest rise; unlabored respirations.  Extremities- BLE AKA   Neuro: Mental Status -  Alert and awake.  Orientation to time, place and person intact.   Language intact. Speech moderate dysarthria. Attention span and concentration were normal.   Right upper and right lower extremities are normal strength and movement-5/5.   Left upper and lower extremities with 2 out of 5 strength She has left facial droop.  Sensation is decreased on the left. Visual fields are intact. Her extraocular movements are intact. Right gaze preference but crosses midline. She follows commands. Gait is not observed as noted above she has a bilateral AKA.  Discharge Diet       Diet   DIET DYS 2 Room service  appropriate? Yes with Assist; Fluid consistency: Nectar Thick   liquids  DISCHARGE PLAN Disposition:  SNF Eliquis (apixaban) daily for secondary stroke prevention  Ongoing stroke risk factor control by Primary Care Physician at time of discharge Follow-up PCP Pincus Sanes, [name] in 2 weeks. Follow-up in [facility] in 4 weeks, office to schedule an appointment.   35 minutes were spent preparing discharge.  Pt seen by Neuro NP/APP and later by [name]. Note/plan to be edited by [name] as needed.    Lynnae January, DNP, AGACNP-BC [facility] Please use AMION for pager and EPIC for messaging  ATTENDING NOTE: I reviewed above note and agree with the assessment and plan. Pt was seen and examined.   No family at bedside.  Patient lying bed, no acute complaints.  Neuro stable, no acute event overnight.  Medically ready for SNF placement.  Continue Eliquis and statin.  PT/OT.  Follow-up at [facility] in 4 weeks.  For detailed assessment and plan, please refer to above/below as I have made changes wherever appropriate.   Marvel Plan, MD PhD Stroke Neurology 09/18/2022 5:44 PM

## 2022-09-13 NOTE — Progress Notes (Addendum)
STROKE TEAM PROGRESS NOTE   INTERVAL HISTORY Patient seen sitting up in her bed.  Her RN is at bedside.  Her vital signs have been stable, and her neurological exam is unchanged.   She did attempt to ambulate yesterday with physical therapy using a prosthetic legs but did not do very well. She is having difficulty tolerating her leg shrinkers due to pain.  Vital signs stable.  Neurological exam unchanged.  She is still struggling with the food here, but has been eating some of her meals.  Still awaiting decision from family on SNF choice.  TOC team is working closely with [place] and the family to coordinate the discharge  Vitals:   09/13/22 0500 09/13/22 0722 09/13/22 1111 09/13/22 1512  BP:  133/66 135/68 (!) 143/75  Pulse:  89 88 83  Resp:  16 15 16   Temp:  99 F (37.2 C) 99.6 F (37.6 C) 99.4 F (37.4 C)  TempSrc:  Oral Oral Oral  SpO2:  98% 95% 94%  Weight: 45.1 kg      CBC:  Recent Labs  Lab 09/10/22 0411 09/11/22 0345  WBC 8.5 9.0  HGB 10.2* 10.0*  HCT 33.3* 31.6*  MCV 75.7* 75.2*  PLT 577* 884*   Basic Metabolic Panel:  Recent Labs  Lab 09/10/22 0411 09/11/22 0345  NA 135 133*  K 4.3 4.2  CL 104 100  CO2 22 22  GLUCOSE 135* 128*  BUN 26* 30*  CREATININE 0.69 0.70  CALCIUM 8.7* 8.7*   Lipid Panel:  No results for input(s): "CHOL", "TRIG", "HDL", "CHOLHDL", "VLDL", "LDLCALC" in the last 168 hours.  HgbA1c: No results for input(s): "HGBA1C" in the last 168 hours. Urine Drug Screen: No results for input(s): "LABOPIA", "COCAINSCRNUR", "LABBENZ", "AMPHETMU", "THCU", "LABBARB" in the last 168 hours.  Alcohol Level  No results for input(s): "ETH" in the last 168 hours.   IMAGING past 24 hours DG Swallowing Func-Speech Pathology  Result Date: 09/13/2022 Table formatting from the original result was not included. Objective Swallowing Evaluation: Type of Study: MBS-Modified Barium Swallow Study  Patient Details Name: SHELETHA BOW MRN: [id number] Date of Birth:  01-Jan-1956 Today's Date: 09/13/2022 Time: SLP Start Time (ACUTE ONLY): 85 -SLP Stop Time (ACUTE ONLY): 1343 SLP Time Calculation (min) (ACUTE ONLY): 23 min Past Medical History: Past Medical History: Diagnosis Date  Abnormal LFTs   a. in the past when on Lipitor  Anemia   as a young woman  Anxiety   Arnold-Chiari malformation, type I ([place])   MRI brain 02/2010  CAD (coronary artery disease)   a. NSTEMI 07/2009: LHC - D2 40%, LAD irreg., EF 50% with apical AK (tako-tsubo CM);  b. Inf STEMI (04/2013): Tx Promus DES to CFX;  c. 08/2013 Lexi CL: No ischemia, dist ant/ap/inf-ap infarct, EF  d. 06/2016 NSTEMI with LM disease: s/p CABG on 07/17/2016 w/ LIMA-LAD, SVG-OM, and SVG-dRCA.  DEPRESSION   Dizziness   ? CVA 01/2010 - carotid dopplers with no ICA stenosis  GERD (gastroesophageal reflux disease)   Headache   History of transmetatarsal amputation of left foot ([place]) 07/17/2019  HYPERLIPIDEMIA   HYPERTENSION   MI (myocardial infarction) ([place])   PAD (peripheral artery disease) ([place])   s/p L fem pop 11/2013 in setting of 1st toe osteo/gangrene  Paroxysmal atrial fibrillation ([facility])   a. anticoagulated with Eliquis 10/2014  Presence of permanent cardiac pacemaker 10/2014  leadless permanent pacemaker  RA (rheumatoid arthritis) ([place])   prior tx by Dr. Ouida Sills  Sjogren's syndrome (  [place]) 06/02/2013  pt denies this (12/01/13)  SLE (systemic lupus erythematosus) ([place]) dx 05/2013  follows with rheum Ouida Sills)  Stroke [facility])   March 2023, weakness in right arm is improving  Tachy-brady syndrome ([place])   a. 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  f/u echo 09/2009: EF 50-55%, mild LVH, mod diast dysfxn, mild apical HK  Wears dentures   Wears glasses  Past Surgical History: Past Surgical History: Procedure Laterality Date  ABDOMINAL AORTAGRAM N/A 11/24/2013  Procedure: ABDOMINAL AORTAGRAM WITH BILATERAL RUNOFF WITH POSSIBLE INTERVENTION;  Surgeon: Angelia Mould, MD;  Location: [facility] CATH LAB;   Service: Cardiovascular;  Laterality: N/A;  ABDOMINAL AORTOGRAM W/LOWER EXTREMITY N/A 04/25/2019  Procedure: ABDOMINAL AORTOGRAM W/LOWER EXTREMITY;  Surgeon: Elam Dutch, MD;  Location: [place] CV LAB;  Service: Cardiovascular;  Laterality: N/A;  ABDOMINAL AORTOGRAM W/LOWER EXTREMITY Bilateral 05/15/2019  Procedure: ABDOMINAL AORTOGRAM W/LOWER EXTREMITY;  Surgeon: Serafina Mitchell, MD;  Location: [place] CV LAB;  Service: Cardiovascular;  Laterality: Bilateral;  ABDOMINAL AORTOGRAM W/LOWER EXTREMITY N/A 08/23/2020  Procedure: ABDOMINAL AORTOGRAM W/LOWER EXTREMITY;  Surgeon: Waynetta Sandy, MD;  Location: [place] CV LAB;  Service: Cardiovascular;  Laterality: N/A;  ABDOMINAL AORTOGRAM W/LOWER EXTREMITY N/A 10/29/2020  Procedure: ABDOMINAL AORTOGRAM W/LOWER EXTREMITY;  Surgeon: Elam Dutch, MD;  Location: [place] CV LAB;  Service: Cardiovascular;  Laterality: N/A;  AMPUTATION Left 12/02/2013  Procedure: LEFT 1ST TOE AMPUTATION;  Surgeon: Elam Dutch, MD;  Location: [place];  Service: Vascular;  Laterality: Left;  AMPUTATION Left 07/16/2019  Procedure: LEFT TRANSMETATARSAL AMPUTATION;  Surgeon: Newt Minion, MD;  Location: [place];  Service: Orthopedics;  Laterality: Left;  AMPUTATION Left 10/01/2019  Procedure: LEFT AMPUTATION ABOVE KNEE;  Surgeon: Elam Dutch, MD;  Location: [place];  Service: Vascular;  Laterality: Left;  AMPUTATION Right 08/31/2020  Procedure: Right first toe amputation;  Surgeon: Elam Dutch, MD;  Location: [place];  Service: Vascular;  Laterality: Right;  AMPUTATION Right 11/23/2020  Procedure: RIGHT AMPUTATION ABOVE KNEE;  Surgeon: Elam Dutch, MD;  Location: [place];  Service: Vascular;  Laterality: Right;  AMPUTATION Left 03/14/2022  Procedure: REVISION LEFT ABOVE KNEE AMPUTATION;  Surgeon: Waynetta Sandy, MD;  Location: [place];  Service: Vascular;  Laterality: Left;  APPLICATION OF WOUND VAC Left [id number]  Procedure: Application Of Wound  Vac, left lower extremity;  Surgeon: Elam Dutch, MD;  Location: [place];  Service: Vascular;  Laterality: Left;  CARDIAC CATHETERIZATION N/A 07/12/2016  Procedure: Left Heart Cath and Coronary Angiography;  Surgeon: Troy Sine, MD;  Location: [place] CV LAB;  Service: Cardiovascular;  Laterality: N/A;  CARDIAC CATHETERIZATION N/A 07/14/2016  Procedure: Intravascular Ultrasound/IVUS;  Surgeon: Nelva Bush, MD;  Location: [place] CV LAB;  Service: Cardiovascular;  Laterality: N/A;  COLONOSCOPY    CORONARY ANGIOPLASTY  04/2013  CORONARY ARTERY BYPASS GRAFT N/A 07/17/2016  Procedure: CORONARY ARTERY BYPASS GRAFTING (CABG)x3 using right greater saphenous vein harvested endoscopiclly and left internal mammary artery;  Surgeon: Ivin Poot, MD;  Location: [place];  Service: Open Heart Surgery;  Laterality: N/A;  ENDARTERECTOMY FEMORAL Left 05/12/2019  Procedure: LEFT Femoral Endarterectomy;  Surgeon: Elam Dutch, MD;  Location[facility] OR;  Service: Vascular;  Laterality: Left;  FEMORAL-POPLITEAL BYPASS GRAFT Left 12/02/2013  Procedure:  LEFT FEMORAL-BELOW KNEE POPLITEAL ARTERY BYPASS GRAFT;  Surgeon: Elam Dutch, MD;  Location: [place];  Service: Vascular;  Laterality: Left;  FEMORAL-POPLITEAL BYPASS GRAFT  Left 09/25/2019  Procedure: THROMBECTOMY and PROPATEN BYPASS  FEMORAL TO BELOW KNEE POPLITEAL ARTERY BYPASS GRAFT LEFT LEG;  Surgeon: [name], MD;  Location: [place] OR;  Service: Vascular;  Laterality: Left;  FEMORAL-POPLITEAL BYPASS GRAFT Right 08/24/2020  Procedure: RIGHT FEMORAL-BELOW KNEE POPLITEAL ARTERY BYPASS USING GORE PROPATEN GRAFT;  Surgeon: Waynetta Sandy, MD;  Location: [place];  Service: Vascular;  Laterality: Right;  FEMORAL-POPLITEAL BYPASS GRAFT Right 11/01/2020  Procedure: Thrombectomy and Revision of Right Femoral to below knee Bypass with Interpostional graft to Tibial / peroneal Trunk and Endarterectomy Tibial Letitia Caul Trunk.;  Surgeon: Elam Dutch, MD;   Location: [facility] OR;  Service: Vascular;  Laterality: Right;  INTRAOPERATIVE ARTERIOGRAM Right 11/01/2020  Procedure: ANTEGRADE RIGHT LOWER EXTREMITY INTRA OPERATIVE ARTERIOGRAM;  Surgeon: Elam Dutch, MD;  Location: [facility] OR;  Service: Vascular;  Laterality: Right;  IR CT HEAD LTD  08/28/2022  IR PERCUTANEOUS ART THROMBECTOMY/INFUSION INTRACRANIAL INC DIAG ANGIO  08/28/2022  IR US GUIDE VASC ACCESS RIGHT  08/28/2022  LEFT HEART CATHETERIZATION WITH CORONARY ANGIOGRAM N/A 05/19/2013  Procedure: LEFT HEART CATHETERIZATION WITH CORONARY ANGIOGRAM;  Surgeon: Blane Ohara, MD;  Location: [facility] CATH LAB;  Service: Cardiovascular;  Laterality: N/A;  LEFT HEART CATHETERIZATION WITH CORONARY ANGIOGRAM N/A 10/28/2014  Procedure: LEFT HEART CATHETERIZATION WITH CORONARY ANGIOGRAM;  Surgeon: [place], MD;  Location: [place] CATH LAB;  Service: Cardiovascular;  Laterality: N/A;  LOWER EXTREMITY ANGIOGRAM Left 05/12/2019  Procedure: Lower Extremity Angiogram;  Surgeon: Elam Dutch, MD;  Location: [facility] OR;  Service: Vascular;  Laterality: Left;  PATCH ANGIOPLASTY Left 05/12/2019  Procedure: Left Femoral Patch Angioplasty USING CADAVERIC SAPHENOUS VEIN;  Surgeon: Elam Dutch, MD;  Location: [facility] OR;  Service: Vascular;  Laterality: Left;  PERCUTANEOUS CORONARY STENT INTERVENTION (PCI-S)  05/19/2013  Procedure: PERCUTANEOUS CORONARY STENT INTERVENTION (PCI-S);  Surgeon: Blane Ohara, MD;  Location: [facility] CATH LAB;  Service: Cardiovascular;;  PERIPHERAL VASCULAR INTERVENTION Right 10/29/2020  Procedure: PERIPHERAL VASCULAR INTERVENTION;  Surgeon: Elam Dutch, MD;  Location: [place] CV LAB;  Service: Cardiovascular;  Laterality: Right;  common iliac  PERMANENT PACEMAKER INSERTION N/A 11/04/2014  STJ Leadless pacemaker implanted by Dr Rayann Heman for tachy/brady  RADIOLOGY WITH ANESTHESIA N/A 08/28/2022  Procedure: RADIOLOGY WITH ANESTHESIA;  Surgeon: Radiologist, Medication, MD;  Location: [place];  Service: Radiology;  Laterality: N/A;   TEE WITHOUT CARDIOVERSION N/A 07/17/2016  Procedure: TRANSESOPHAGEAL ECHOCARDIOGRAM (TEE);  Surgeon: Ivin Poot, MD;  Location: [place];  Service: Open Heart Surgery;  Laterality: N/A;  THROMBECTOMY FEMORAL ARTERY Left 05/12/2019  Procedure: Left Ilio-Femoral Artery and Femoral-popliteal Graft Thrombectomy;  Surgeon: Elam Dutch, MD;  Location: [facility] OR;  Service: Vascular;  Laterality: Left;  THROMBECTOMY OF BYPASS GRAFT FEMORAL- POPLITEAL ARTERY Right 11/01/2020  Procedure: POSSIBLE THROMBOLYSIS VERSUS OPEN THROMBECTOMY AND REVSION OF RIGHT FEMORAL-POPLITEAL ARTERY BYPASS;  Surgeon: Elam Dutch, MD;  Location: [place];  Service: Vascular;  Laterality: Right;  TUBAL LIGATION    VISCERAL ANGIOGRAPHY N/A 04/25/2019  Procedure: MESENTRIC  ANGIOGRAPHY;  Surgeon: Elam Dutch, MD;  Location: [place] CV LAB;  Service: Cardiovascular;  Laterality: N/A;  WOUND DEBRIDEMENT Left 11/03/2019  Procedure: Debridement Wound of left above the knee amputation;  Surgeon: Elam Dutch, MD;  Location: [facility] OR;  Service: Vascular;  Laterality: Left; HPI: 67 yo F presenting 08/28/22 with L sided weakness, not speaking, R gaze preference. 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 Pt was evaluated by SLP in March 2023 and  found to have mild-moderate dysarthria; cognitive-linguistic function and swallowing WFL. PMH includes: L frontal CVA, GERD, sjogren's syndrome, B AKA, takotsubo CM, SLE, RA, PAD, MI, HTN, CAD, anxiety, depression, chiari malformation. MBS 1/9 revealed moderate risk of dyspagia due to oropharyngeal dyspagia characterized by poor coordination for swallow and facial weakness causing anterior loss.  Subjective: alert  Recommendations for follow up therapy are one component of a multi-disciplinary discharge planning process, led by the attending physician.  Recommendations may be updated based on patient status, additional functional criteria and insurance authorization. Assessment / Plan /  Recommendation   09/13/2022   3:00 PM Clinical Impressions Clinical Impression Pt presents with improved oropharyngeal swallow.  She continues to present with oral deficits, with left-sided leakage and reduced bolus control.  She demonstrated improved timing of pharyngeal swallow with no observed aspiration of nectar-thick liquids; there was trace aspiration of thin liquids noted prior to the swallow and only when drinking from the edge of a cup. (Thin liquids provided from a spoon did not lead to aspiration).  There was adequate pharyngeal stripping and no residue post-swallow. Recommend advancing diet back to dysphagia 2; allow nectar thick liquids. Pt needs full supervision and assistance with meals, including attention to her positioning (should be seated upright) and checking for oral loss and residue.  SLP will follow. SLP Visit Diagnosis Dysphagia, oropharyngeal phase (R13.12) Impact on safety and function Mild aspiration risk     09/13/2022   3:00 PM Treatment Recommendations Treatment Recommendations Therapy as outlined in treatment plan below     09/13/2022   3:00 PM Prognosis Prognosis for Safe Diet Advancement Good Barriers to Reach Goals Severity of deficits   09/13/2022   3:00 PM Diet Recommendations SLP Diet Recommendations Dysphagia 2 (Fine chop) solids;Nectar thick liquid Liquid Administration via Cup;Spoon Medication Administration Whole meds with puree Compensations Slow rate;Small sips/bites;Monitor for anterior loss;Lingual sweep for clearance of pocketing Postural Changes Seated upright at 90 degrees     09/13/2022   3:00 PM Other Recommendations Oral Care Recommendations Oral care BID Other Recommendations Prohibited food (jello, ice cream, thin soups);Remove water pitcher Follow Up Recommendations Skilled nursing-short term rehab (<3 hours/day) Functional Status Assessment Patient has had a recent decline in their functional status and demonstrates the ability to make significant improvements in  function in a reasonable and predictable amount of time.   09/13/2022   3:00 PM Frequency and Duration  Speech Therapy Frequency (ACUTE ONLY) min 2x/week Treatment Duration 2 weeks     09/13/2022   3:00 PM Oral Phase Oral Phase Impaired Oral - Honey Teaspoon NT Oral - Honey Cup NT Oral - Nectar Teaspoon Weak lingual manipulation;Lingual/palatal residue Oral - Nectar Straw Weak lingual manipulation;Lingual/palatal residue Oral - Thin Teaspoon Lingual/palatal residue;Left anterior bolus loss Oral - Thin Cup Lingual/palatal residue;Left anterior bolus loss Oral - Thin Straw NT Oral - Puree Weak lingual manipulation;Delayed oral transit Oral - Regular Delayed oral transit;Decreased bolus cohesion;Weak lingual manipulation    09/13/2022   3:00 PM Pharyngeal Phase Pharyngeal Phase Impaired Pharyngeal- Honey Teaspoon NT Pharyngeal- Honey Cup NT Pharyngeal- Nectar Teaspoon Delayed swallow initiation-pyriform sinuses Pharyngeal Material does not enter airway Pharyngeal- Nectar Cup Delayed swallow initiation-pyriform sinuses;Penetration/Aspiration before swallow Pharyngeal Material enters airway, remains ABOVE vocal cords then ejected out Pharyngeal- Nectar Straw NT Pharyngeal- Thin Teaspoon Delayed swallow initiation-pyriform sinuses Pharyngeal Material does not enter airway Pharyngeal- Thin Cup Delayed swallow initiation-pyriform sinuses;Penetration/Aspiration before swallow Pharyngeal Material enters airway, passes BELOW cords without attempt by patient to eject out (  silent aspiration) Pharyngeal- Thin Straw NT Pharyngeal- Puree Delayed swallow initiation-vallecula Pharyngeal- Regular Delayed swallow initiation-vallecula    09/05/2022  12:00 PM Cervical Esophageal Phase  Cervical Esophageal Phase WFL [name] 09/13/2022, 4:04 PM                      Temp:  [98.2 F (36.8 C)-99.6 F (37.6 C)] 99.4 F (37.4 C) (01/17 1512) Pulse Rate:  [74-89] 83 (01/17 1512) Resp:  [15-16] 16 (01/17 1512) BP:  (133-144)/(61-75) 143/75 (01/17 1512) SpO2:  [94 %-98 %] 94 % (01/17 1512) Weight:  [45.1 kg] 45.1 kg (01/17 0500)  General -frail elderly Caucasian lady, in no apparent distress. HEENT- atraumatic, normocephalic  Cardiovascular - Regular rhythm and rate. Pulmonary- equal chest rise; unlabored respirations.  Extremities- BLE AKA  Neuro: Mental Status -  Alert and awake.  Orientation to time, place and person intact.   Language intact. Speech moderate dysarthria. Attention span and concentration were normal.  Right upper and right lower extremities are normal strength and movement-5/5.  Left upper and lower extremities with 2 out of 5 strength She has left facial droop.  Sensation is decreased on the left. Visual fields are intact. Her extraocular movements are intact. Right gaze preference but crosses midline. She follows commands. Gait is not observed as noted above she has a bilateral AKA.   ASSESSMENT/PLAN Ms. HIYA POINT is a 67 y.o. female with history of CAD, GERD, CVA in March of 2023, HTN, HLD, PAD with bilateral AKA, AF on Eliquis, AICD, Sjogren's syndrome, and cardiomyopathy presenting with right gaze deviation and left-sided weakness. LKW 08/28/22 15:45.  Stroke: Acute right hemispheric infarct due to acute right ICA terminus occlusion.  Etiology is likely embolic secondary to atrial fibrillation s/p IR with TICI3 Code Stroke right M1 segment concerning for acute thrombus.  Slight asymmetric hypoattenuation in the right lentiform nucleus and insular ribbon.  This could represent early infarct. ASPECTS 8/10.   CTA head & neck occlusion of the right ICA in its distal segment without reconstitution prior to the skull base.  The right ICA is occluded through the ICA terminus.  Right M1 and A1 segments are occluded.  Minimal filling of distal right MCA branch vessels with some collaterals.   IR with ICA, MCA, and ACA achieving complete recanalization.   CT 1/2 right MCA  infarct with petechial hemorrhage, interhemispheric SAH CT 1/5 decreased interhemispheric SAH, right BG infarct with petechial hemorrhage, stable. CT 1/11 stable right hemispheric infarct with no hemorrhagic transformation, virtually resolved subarachnoid hemorrhage MRI unable to obtain due to PPM. 2D Echo EF 55 to 60% LDL 82 HgbA1c 6.6 VTE prophylaxis -fully anticoagulated with Eliquis Eliquis (apixaban) daily prior to admission, now fully anticoagulated with Eliquis Therapy recommendations: SNF Disposition: Pending  History of CVA and seizure 10/2014 admitted for focal seizure.  Patient not on AED. 10/2021 presenting with speech disturbance and right upper extremity weakness.  CT showed left large subcortical infarct.  CTA head and neck left ICA 60% stenosis.  Carotid Doppler left ICA 40 to 59% stenosis.  EF 55 to 60%.  LDL 86, A1c 6.2. It was felt that her stroke was due to PAF with Eliquis nonadherence as patient stopped taking AC as she perceived "it was not doing anything for me". Discharged on Eliquis and Crestor 40. Patient had residual dysarthria and improving RUE weakness  Dysphagia MBS- done 1/9- dysphagia 2 with honey thick liquids Cortrak in place with TF On nocturnal feeding  to facilitate po intake  Paroxysmal atrial fibrillation Previously noncompliant with CVA in 10/2021 Patient endorses current compliance with Eliquis  Repeat head CT looks good with virtual resolution of subarachnoid hemorrhage, will restart Eliquis  Hypertension Home meds: Lopressor Stable  On Norvasc 10  long-term BP goal normotensive  Hyperlipidemia Home meds: Crestor 40 mg, resumed in hospital LDL 82, goal < 70 Continue statin at discharge  PAD status post bilateral AKA Right leg pain Right leg warm, no sign of ischemic limb  pain likely due to PAD versus lying in the bed without being up and about with her prosthesis as she usually is. Continue gabapentin 100->200 tid Tylenol and oxycodone  as needed  Other Stroke Risk Factors Advanced Age >/= 8  Former cigarette smoker Coronary artery disease  Other Active Problems SSS status post pacemaker Sjogren syndrome Constipation - bowel regimen - fleet enema today  Hospital day # 16  Patient was counseled to be compliant with her diet and eat well.  Continue ongoing therapies.  Medically stable to be transferred to rehabilitation when bed available.  Patient seen and examined by NP/APP with MD. MD to update note as needed.   Elmer Picker, DNP, FNP-BC [facility] Pager: [phone] I have personally obtained history,examined this patient, reviewed notes, independently viewed imaging studies, participated in medical decision making and plan of care.ROS completed by me personally and pertinent positives fully documented  I have made any additions or clarifications directly to the above note. Agree with note above.  Patient's family is going to review skilled nursing facility and decide.  Awaiting on the decision  Delia Heady, MD Medical Director [facility] Pager: [phone] 09/13/2022 5:10 PM   To contact Stroke Continuity provider, please refer to [email or web address]. After hours, contact General Neurology

## 2022-09-13 NOTE — TOC Progression Note (Signed)
Transition of Care [facility]) - Progression Note    Patient Details  Name: Mercedes [name] MRN: [id number] Date of Birth: [birth year]  Transition of Care [facility]) CM/SW [place], [place] Phone Number: 09/13/2022, 11:01 AM  Clinical Narrative:     Received call from patient's daughter, she advised they will try and have a SNF choice by this afternoon. She states lives in [place] and is trying to coordinate the patient's care. She states " I'm not placing my mother just anywhere". She states the patient's brother will also visit a facility today. CSW again explained the SNF process and encouraged family to make decision of possible choice by the end of today. She states she will try.   TOC will continue to follow and assist with discharge planning.   Thurmond Butts, MSW, LCSW Clinical Social Worker    Expected Discharge Plan: Skilled Nursing Facility Barriers to Discharge: Ship broker, Continued Medical Work up, SNF Pending bed offer  Expected Discharge Plan and Services In-house Referral: Clinical Social Work   Post Acute Care Choice: [place] Living arrangements for the past 2 months: [place] Determinants of Health (SDOH) Interventions SDOH Screenings   Food Insecurity: No Food Insecurity (07/15/2021)  Housing: Low Risk  (07/15/2021)  Transportation Needs: No Transportation Needs (07/15/2021)  Alcohol Screen: Low Risk  (07/15/2021)  Depression (PHQ2-9): Low Risk  (11/08/2021)  Financial Resource Strain: Low Risk  (07/15/2021)  Physical Activity: Inactive (07/15/2021)  Social Connections: Unknown (07/15/2021)  Stress: No Stress Concern Present (07/15/2021)  Tobacco Use: Medium Risk (08/29/2022)    Readmission Risk Interventions    03/17/2022   10:38 AM 09/08/2020   12:03 PM  Readmission Risk Prevention Plan  Post Dischage Appt Complete   Medication Screening Complete    Transportation Screening Complete Complete  PCP or Specialist Appt within 5-7 Days  Complete  Home Care Screening  Complete  Medication Review (RN CM)  Complete

## 2022-09-13 NOTE — Progress Notes (Signed)
Modified Barium Swallow Progress Note  Patient Details  Name: Mercedes [name] MRN: [id number] Date of Birth: [birth year]  Today's Date: 09/13/2022  Modified Barium Swallow completed.  Full report located under Chart Review in the Imaging Section.  Brief recommendations include the following:  Clinical Impression  Pt presents with improved oropharyngeal swallow.  She continues to present with oral deficits, with left-sided leakage and reduced bolus control.  She demonstrated improved timing of pharyngeal swallow with no observed aspiration of nectar-thick liquids; there was trace aspiration of thin liquids noted prior to the swallow and only when drinking from the edge of a cup. (Thin liquids provided from a spoon did not lead to aspiration).  There was adequate pharyngeal stripping and no residue post-swallow. Recommend advancing diet back to dysphagia 2; allow nectar thick liquids. Pt needs full supervision and assistance with meals, including attention to her positioning (should be seated upright) and checking for oral loss and residue.  SLP will follow.   Swallow Evaluation Recommendations       SLP Diet Recommendations: Dysphagia 2 (Fine chop) solids;Nectar thick liquid   Liquid Administration via: Cup;Spoon   Medication Administration: Whole meds with puree   Supervision: Staff to assist with self feeding   Compensations: Slow rate;Small sips/bites;Monitor for anterior loss;Lingual sweep for clearance of pocketing   Postural Changes: Seated upright at 90 degrees   Oral Care Recommendations: Oral care BID   Other Recommendations: Prohibited food (jello, ice cream, thin soups);Remove water pitcher   Mercedes Chowning L. Tivis Ringer, MA CCC/SLP Clinical Specialist - Acute Care SLP Acute Rehabilitation Services Office number [phone]  Mercedes [name] 09/13/2022,4:04 PM

## 2022-09-14 DIAGNOSIS — I63231 Cerebral infarction due to unspecified occlusion or stenosis of right carotid arteries: Secondary | ICD-10-CM | POA: Diagnosis not present

## 2022-09-14 LAB — GLUCOSE, CAPILLARY
Glucose-Capillary: 116 mg/dL — ABNORMAL HIGH (ref 70–99)
Glucose-Capillary: 111 mg/dL — ABNORMAL HIGH (ref 70–99)
Glucose-Capillary: 142 mg/dL — ABNORMAL HIGH (ref 70–99)
Glucose-Capillary: 118 mg/dL — ABNORMAL HIGH (ref 70–99)
Glucose-Capillary: 143 mg/dL — ABNORMAL HIGH (ref 70–99)
Glucose-Capillary: 100 mg/dL — ABNORMAL HIGH (ref 70–99)

## 2022-09-14 MED ORDER — GABAPENTIN 100 MG PO CAPS
200.0000 mg | ORAL_CAPSULE | Freq: Three times a day (TID) | ORAL | Status: DC
  Administered 2022-09-14 – 2022-09-16 (×7): 200 mg via ORAL
  Filled 2022-09-14 (×8): qty 2

## 2022-09-14 NOTE — TOC Progression Note (Signed)
Transition of Care [facility]) - Progression Note    Patient Details  Name: Mercedes [name] MRN: [id number] Date of Birth: 10-17-1955  Transition [facility]) CM/SW Contact  Vinie Sill, [place] Phone Number: 09/14/2022, 10:53 AM  Clinical Narrative:     Spoke with patient's daughter- she reports her daughter is visiting [facility] and she will informed CSW of her decision today. Family is aware she is medically stable.   TOC will continue to follow and assist with discharge planning.  Expected Discharge Plan: [place] Barriers to Discharge: Ship broker, Continued Medical Work up, SNF Pending bed offer  Expected Discharge Plan and Services In-house Referral: Clinical Social Work   Post Acute Care Choice: [place] Living arrangements for the past 2 months: [place] Determinants of Health (SDOH) Interventions SDOH Screenings   Food Insecurity: No Food Insecurity (07/15/2021)  Housing: Low Risk  (07/15/2021)  Transportation Needs: No Transportation Needs (07/15/2021)  Alcohol Screen: Low Risk  (07/15/2021)  Depression (PHQ2-9): Low Risk  (11/08/2021)  Financial Resource Strain: Low Risk  (07/15/2021)  Physical Activity: Inactive (07/15/2021)  Social Connections: Unknown (07/15/2021)  Stress: No Stress Concern Present (07/15/2021)  Tobacco Use: Medium Risk (08/29/2022)    Readmission Risk Interventions    03/17/2022   10:38 AM 09/08/2020   12:03 PM  Readmission Risk Prevention Plan  Post Dischage Appt Complete   Medication Screening Complete   Transportation Screening Complete Complete  PCP or Specialist Appt within 5-7 Days  Complete  Home Care Screening  Complete  Medication Review (RN CM)  Complete

## 2022-09-14 NOTE — Progress Notes (Signed)
   09/14/22 1050  Spiritual Encounters  Type of Visit Initial  Care provided to: Patient  Referral source Nurse (RN/NT/LPN)  Reason for visit Routine spiritual support  OnCall Visit No   Chaplain met with patient, Mercedes [name], we spoke a bit but she seemed unsure as to why I was there. Mercedes [name] was enjoying her TV show and I engaged her about that. She told me about the characters that were important to her. [name] also shared that she enjoyed her meal that she had in front of her. I encouraged her to enjoy her day as I departed.   Danice Goltz [facility]  [phone]

## 2022-09-14 NOTE — Progress Notes (Signed)
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   OT comments  Session focused on exercises for LUE in seated position in bed, maintaining seated balance long sitting in bed (min guard to mod A), transfer to recliner (max A) for grooming (min to mod A) with initiation of eating breakfast (min A). Declined donning prosthetic sleeves today due to pain in BLE residual limbs. Pt tearful during session and expressing depressed mood. This OT will look into potentially trying to get her outside sometime next week - also potentially painting fingernails (Pt likes to do that normally). OT would also like to see a Palliative Consult and/or Chaplain consult as Pt expressed hopelessness about the "long road ahead" and quality of life being poor "If I have to stay like this" hesitancy expressed about her expressive difficulties. OT will continue to work with Pt in acute setting and SNF remains appropriate post-acute setting to maximize safety and independence in ADL.    Recommendations for follow up therapy are one component of a multi-disciplinary discharge planning process, led by the attending physician.  Recommendations may be updated based on patient status, additional functional criteria and insurance authorization.    Follow Up Recommendations  Skilled nursing-short term rehab (<3 hours/day)     Assistance Recommended at Discharge Intermittent Supervision/Assistance  Patient can return home with the following  Two people to help with walking and/or transfers;A lot of help with bathing/dressing/bathroom   Equipment Recommendations  Wheelchair (measurements OT);Wheelchair cushion (measurements OT);Hospital bed;Other (comment) (hoyer)    Recommendations for Other  Services Other (comment) (Palliative/Chaplain services)    Precautions / Restrictions Precautions Precautions: Fall Precaution Comments: SBP 120-140 Restrictions Weight Bearing Restrictions: No Other Position/Activity Restrictions: h/o B AKA       Mobility Bed Mobility Overal bed mobility: Needs Assistance Bed Mobility: Supine to Sit     Supine to sit: HOB elevated, Max assist (to come into long sitting)     General bed mobility comments: assist for sequencing, physical assist to elevate trunk into long sitting, uses bed rails for sitting balance,    Transfers Overall transfer level: Needs assistance Equipment used: 1 person hand held assist Transfers: Bed to chair/wheelchair/BSC         Anterior-Posterior transfers: Max assist   General transfer comment: Pt assisting with turning in bed, and then pushing back hip by hip into the recliner, heavy use of bed pad to assist and step by step sequencing     Balance Overall balance assessment: Needs assistance Sitting-balance support: Single extremity supported, Feet supported Sitting balance-Leahy Scale: Poor Sitting balance - Comments: min A-mod A with RUE support of railing, modA without UE support Postural control: Posterior lean, Left lateral lean                                 ADL either performed or assessed with clinical judgement   ADL Overall ADL's : Needs assistance/impaired Eating/Feeding: Minimal assistance;Cueing for safety;Cueing for compensatory techinques Eating/Feeding Details (indicate cue type and reason): with pocketing in L cheek Grooming: Minimal assistance;Sitting;Wash/dry face Grooming Details (indicate cue type and reason): required assist for Bil hand functions, does attend to both side of face with warm wash cloth Upper Body  Bathing: Maximal assistance               Toilet Transfer: Maximal assistance;Anterior/posterior Armed forces technical officer Details (indicate cue type and  reason): simulated through recliner transfer         Functional mobility during ADLs: Maximal assistance (AP transfer) General ADL Comments: L inattention, decreased selfawareness of deficits    Extremity/Trunk Assessment              Vision       Perception     Praxis      Cognition Arousal/Alertness: Awake/alert Behavior During Therapy: Flat affect, WFL for tasks assessed/performed Overall Cognitive Status: Within Functional Limits for tasks assessed                                 General Comments: delayed response time but appropriate, word finding difficulty at times        Exercises Exercises: General Upper Extremity, Amputee General Exercises - Upper Extremity Shoulder Flexion: PROM, Left, 10 reps Shoulder ABduction: PROM, Left, 10 reps Elbow Flexion: PROM, Left, 10 reps Elbow Extension: PROM, Left, 10 reps Wrist Flexion: Left, 10 reps, PROM (reporting soreness in wrist) Wrist Extension: Left, 10 reps, PROM (reporting soreness in wrist) Digit Composite Flexion: Left, AAROM, 10 reps Composite Extension: Left, 10 reps, AAROM Other Exercises Other Exercises: linked L and R hands at midline (interwoven fingers) and lifted together to 60 degrees x10 Other Exercises: sholder shrug x5    Shoulder Instructions       General Comments VSS on RA    Pertinent Vitals/ Pain       Pain Assessment Pain Assessment: Faces Faces Pain Scale: Hurts little more Pain Location: bilateral residual limbs Pain Descriptors / Indicators: Grimacing, Discomfort, Shooting (inability to tolerate shrinkers at this time due to pressure) Pain Intervention(s): Monitored during session  Home Living                                          Prior Functioning/Environment              Frequency  Min 2X/week        Progress Toward Goals  OT Goals(current goals can now be found in the care plan section)  Progress towards OT goals:  Progressing toward goals  Acute Rehab OT Goals Patient Stated Goal: get better OT Goal Formulation: With patient Time For Goal Achievement: 09/26/22 Potential to Achieve Goals: Good  Plan Discharge plan remains appropriate    Co-evaluation                 AM-PAC OT "6 Clicks" Daily Activity     Outcome Measure   Help from another person eating meals?: A Little Help from another person taking care of personal grooming?: A Little Help from another person toileting, which includes using toliet, bedpan, or urinal?: Total Help from another person bathing (including washing, rinsing, drying)?: A Lot Help from another person to put on and taking off regular upper body clothing?: A Lot Help from another person to put on and taking off regular lower body clothing?: A Lot 6 Click Score: 13    End of Session    OT Visit Diagnosis: Unsteadiness on feet (R26.81);Muscle weakness (generalized) (M62.81)   Activity Tolerance Patient tolerated treatment well   Patient Left in chair;with chair alarm  set;with call bell/phone within reach;Other (comment) (lift pad placed underneath)   Nurse Communication Mobility status;Need for lift equipment (no amputee lift pads - cross and keep more supine during transfer; pain in residual limbs and jaw - also may benefit from Palliative/Chaplain visit)        Time: 8469-6295 OT Time Calculation (min): 32 min  Charges: OT General Charges $OT Visit: 1 Visit OT Treatments $Self Care/Home Management : 8-22 mins $Therapeutic Exercise: 8-22 mins  Nyoka Cowden OTR/L Acute Rehabilitation Services Office: [phone]  Evern Bio [facility] 09/14/2022, 9:43 AM

## 2022-09-14 NOTE — Progress Notes (Signed)
STROKE TEAM PROGRESS NOTE   INTERVAL HISTORY Patient seen sitting up in her bed.   Her vital signs have been stable, and her neurological exam is unchanged.    Still awaiting decision from family on SNF choice.  TOC team is working closely with [place] and the family to coordinate the discharge.  Her daughter is going to visit [place] skilled nursing facility today and let us know about her decision.  Vitals:   09/14/22 0312 09/14/22 0500 09/14/22 0751 09/14/22 1201  BP: (!) 142/78  125/76 136/73  Pulse: 82  86 73  Resp: (!) 23  14 18   Temp: 98.2 F (36.8 C)  98.8 F (37.1 C) 97.8 F (36.6 C)  TempSrc: Oral  Oral Oral  SpO2: 99%  98% 96%  Weight:  46.6 kg     CBC:  Recent Labs  Lab 09/10/22 0411 09/11/22 0345  WBC 8.5 9.0  HGB 10.2* 10.0*  HCT 33.3* 31.6*  MCV 75.7* 75.2*  PLT 577* 220*   Basic Metabolic Panel:  Recent Labs  Lab 09/10/22 0411 09/11/22 0345  NA 135 133*  K 4.3 4.2  CL 104 100  CO2 22 22  GLUCOSE 135* 128*  BUN 26* 30*  CREATININE 0.69 0.70  CALCIUM 8.7* 8.7*   Lipid Panel:  No results for input(s): "CHOL", "TRIG", "HDL", "CHOLHDL", "VLDL", "LDLCALC" in the last 168 hours.  HgbA1c: No results for input(s): "HGBA1C" in the last 168 hours. Urine Drug Screen: No results for input(s): "LABOPIA", "COCAINSCRNUR", "LABBENZ", "AMPHETMU", "THCU", "LABBARB" in the last 168 hours.  Alcohol Level  No results for input(s): "ETH" in the last 168 hours.   IMAGING past 24 hours No results found.  Temp:  [97.8 F (36.6 C)-99.4 F (37.4 C)] 97.8 F (36.6 C) (01/18 1201) Pulse Rate:  [73-86] 73 (01/18 1201) Resp:  [14-23] 18 (01/18 1201) BP: (125-147)/(73-80) 136/73 (01/18 1201) SpO2:  [94 %-99 %] 96 % (01/18 1201) Weight:  [46.6 kg] 46.6 kg (01/18 0500)  General -frail elderly Caucasian lady, in no apparent distress. HEENT- atraumatic, normocephalic  Cardiovascular - Regular rhythm and rate. Pulmonary- equal chest rise; unlabored respirations.   Extremities- BLE AKA  Neuro: Mental Status -  Alert and awake.  Orientation to time, place and person intact.   Language intact. Speech moderate dysarthria. Attention span and concentration were normal.  Right upper and right lower extremities are normal strength and movement-5/5.  Left upper and lower extremities with 2 out of 5 strength She has left facial droop.  Sensation is decreased on the left. Visual fields are intact. Her extraocular movements are intact. Right gaze preference but crosses midline. She follows commands. Gait is not observed as noted above she has a bilateral AKA.   ASSESSMENT/PLAN Ms. RAKIYAH ESCH is a 67 y.o. female with history of CAD, GERD, CVA in March of 2023, HTN, HLD, PAD with bilateral AKA, AF on Eliquis, AICD, Sjogren's syndrome, and cardiomyopathy presenting with right gaze deviation and left-sided weakness. LKW 08/28/22 15:45.  Stroke: Acute right hemispheric infarct due to acute right ICA terminus occlusion.  Etiology is likely embolic secondary to atrial fibrillation s/p IR with TICI3 Code Stroke right M1 segment concerning for acute thrombus.  Slight asymmetric hypoattenuation in the right lentiform nucleus and insular ribbon.  This could represent early infarct. ASPECTS 8/10.   CTA head & neck occlusion of the right ICA in its distal segment without reconstitution prior to the skull base.  The right ICA is occluded  through the ICA terminus.  Right M1 and A1 segments are occluded.  Minimal filling of distal right MCA branch vessels with some collaterals.   IR with ICA, MCA, and ACA achieving complete recanalization.   CT 1/2 right MCA infarct with petechial hemorrhage, interhemispheric SAH CT 1/5 decreased interhemispheric SAH, right BG infarct with petechial hemorrhage, stable. CT 1/11 stable right hemispheric infarct with no hemorrhagic transformation, virtually resolved subarachnoid hemorrhage MRI unable to obtain due to PPM. 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  Patient not on AED. 10/2021 presenting with speech disturbance and right upper extremity weakness.  CT showed left large subcortical infarct.  CTA head and neck left ICA 60% stenosis.  Carotid Doppler left ICA 40 to 59% stenosis.  EF 55 to 60%.  LDL 86, A1c 6.2. It was felt that her stroke was due to PAF with Eliquis nonadherence as patient stopped taking AC as she perceived "it was not doing anything for me". Discharged on Eliquis and Crestor 40. Patient had residual dysarthria and improving RUE weakness  Dysphagia MBS- done 1/9- dysphagia 2 with honey thick liquids Cortrak in place with TF On nocturnal feeding to facilitate po intake  Paroxysmal atrial fibrillation Previously noncompliant with CVA in 10/2021 Patient endorses current compliance with Eliquis  Repeat head CT looks good with virtual resolution of subarachnoid hemorrhage, will restart Eliquis  Hypertension Home meds: Lopressor Stable  On Norvasc 10  long-term BP goal normotensive  Hyperlipidemia Home meds: Crestor 40 mg, resumed in hospital LDL 82, goal < 70 Continue statin at discharge  PAD status post bilateral AKA Right leg pain Right leg warm, no sign of ischemic limb  pain likely due to PAD versus lying in the bed without being up and about with her prosthesis as she usually is. Continue gabapentin 100->200 tid Tylenol and oxycodone as needed  Other Stroke Risk Factors Advanced Age >/= 96  Former cigarette smoker Coronary artery disease  Other Active Problems SSS status post pacemaker Sjogren syndrome Constipation - bowel regimen - fleet enema today  Hospital day # 27    Patient's family is going to review skilled nursing facility  and decide.  Awaiting on the decision.  Medically stable to be transferred to skilled nursing facility when bed available  Antony Contras, [place] Pager: [phone] 09/14/2022 3:05 PM   To contact Stroke Continuity provider, please refer to [email or web address] After hours, contact General Neurology

## 2022-09-14 NOTE — TOC Progression Note (Addendum)
Transition of Care [facility]) - Progression Note    Patient Details  Name: Mercedes [name] MRN: [id number] Date of Birth: 1955/11/27  Transition of Care [facility]) CM/SW [place], [place] Phone Number: 09/14/2022, 3:42 PM  Clinical Narrative:     This CSW is covering 4np this afternoon; was informed by regular unit CSW that pt's daughter is wanting [facility] instead of [facility]. CSW confirmed with Chanda Busing they can accept pt once insurance Josem Kaufmann is approved. TOC CMA has submited SNF auth request; status pending. WU[facility]#[id number]   [facility] called and left message with pt's daughter.  Daughter called back shortly after and confirmed choice for [facility]. CSW updated her on anticipated DC tomorrow if Josem Kaufmann is approved.    Expected Discharge Plan: [place] Barriers to Discharge: Insurance Authorization  Expected Discharge Plan and Services In-house Referral: Clinical Social Work   Post Acute Care Choice: [place] Living arrangements for the past 2 months: [place] Determinants of Health (SDOH) Interventions SDOH Screenings   Food Insecurity: No Food Insecurity (07/15/2021)  Housing: Low Risk  (07/15/2021)  Transportation Needs: No Transportation Needs (07/15/2021)  Alcohol Screen: Low Risk  (07/15/2021)  Depression (PHQ2-9): Low Risk  (11/08/2021)  Financial Resource Strain: Low Risk  (07/15/2021)  Physical Activity: Inactive (07/15/2021)  Social Connections: Unknown (07/15/2021)  Stress: No Stress Concern Present (07/15/2021)  Tobacco Use: Medium Risk (08/29/2022)    Readmission Risk Interventions    03/17/2022   10:38 AM 09/08/2020   12:03 PM  Readmission Risk Prevention Plan  Post Dischage Appt Complete   Medication Screening Complete   Transportation Screening Complete Complete  PCP or Specialist Appt within 5-7 Days  Complete  Home Care Screening  Complete   Medication Review (RN CM)  Complete

## 2022-09-15 DIAGNOSIS — I63231 Cerebral infarction due to unspecified occlusion or stenosis of right carotid arteries: Secondary | ICD-10-CM | POA: Diagnosis not present

## 2022-09-15 LAB — GLUCOSE, CAPILLARY
Glucose-Capillary: 130 mg/dL — ABNORMAL HIGH (ref 70–99)
Glucose-Capillary: 104 mg/dL — ABNORMAL HIGH (ref 70–99)
Glucose-Capillary: 131 mg/dL — ABNORMAL HIGH (ref 70–99)
Glucose-Capillary: 133 mg/dL — ABNORMAL HIGH (ref 70–99)
Glucose-Capillary: 140 mg/dL — ABNORMAL HIGH (ref 70–99)
Glucose-Capillary: 119 mg/dL — ABNORMAL HIGH (ref 70–99)

## 2022-09-15 NOTE — Progress Notes (Signed)
Physical Therapy Treatment Patient Details Name: Mercedes [name] MRN: [id number] DOB: 11/03/1955 Today's Date: 09/15/2022   History of Present Illness 67 yo s/p 08/28/22 R ICA occlusion with thrombectomy NIH 19 PMH L frontal CVA B AKA, takotsubo CM, SLE, RA, PAD, MI, HTN, CAD.    PT Comments    Pt tolerates treatment well, transferring multiple times and utilizing RUE to propel wheelchair with PT assistance. PT notes activation of R elbow and shoulder musculature, although still weak even in anti-gravity positioning. Pt will benefit from continued acute PT services in an effort to improve strength and to reduce caregiver burden. PT continues to recommend SNF placement.  Recommendations for follow up therapy are one component of a multi-disciplinary discharge planning process, led by the attending physician.  Recommendations may be updated based on patient status, additional functional criteria and insurance authorization.  Follow Up Recommendations  Skilled nursing-short term rehab (<3 hours/day) Can patient physically be transported by private vehicle: No   Assistance Recommended at Discharge Frequent or constant Supervision/Assistance  Patient can return home with the following Assist for transportation;Two people to help with bathing/dressing/bathroom;Two people to help with walking and/or transfers;Help with stairs or ramp for entrance   Equipment Recommendations  Hospital bed;Other (comment) (hoyer lift with bilateral AKA sling)    Recommendations for Other Services       Precautions / Restrictions Precautions Precautions: Fall Precaution Comments: SBP 120-140 Restrictions Weight Bearing Restrictions: No Other Position/Activity Restrictions: h/o B AKA     Mobility  Bed Mobility Overal bed mobility: Needs Assistance Bed Mobility: Supine to Sit, Rolling Rolling: Min assist   Supine to sit: Mod assist, HOB elevated     General bed mobility comments: pulls into long  sitting with modA and use of railing    Transfers Overall transfer level: Needs assistance Equipment used: 1 person hand held assist Transfers: Bed to chair/wheelchair/BSC         Anterior-Posterior transfers: Max assist   General transfer comment: pt utilizes RUE well to push but requires support at trunk to maintain balance and assist to shift hips. Pt transfers to/from bed/wheelchair    Ambulation/Gait                   Theme park manager mobility: Yes Wheelchair propulsion: Right upper extremity Wheelchair parts: Needs assistance Distance: 100 (100' x 2 trials, pt pushing with RUE, requires modA assistance for steering due to inability to propel wheelchair on left side)  Modified Rankin (Stroke Patients Only) Modified Rankin (Stroke Patients Only) Pre-Morbid Rankin Score: Moderately severe disability Modified Rankin: Severe disability     Balance Overall balance assessment: Needs assistance Sitting-balance support: Single extremity supported Sitting balance-Leahy Scale: Poor Sitting balance - Comments: modA with RUE support of railing Postural control: Posterior lean, Left lateral lean                                  Cognition Arousal/Alertness: Awake/alert Behavior During Therapy: WFL for tasks assessed/performed Overall Cognitive Status: Within Functional Limits for tasks assessed                                          Exercises      General Comments General comments (skin  integrity, edema, etc.): VSS on RA      Pertinent Vitals/Pain Pain Assessment Pain Assessment: No/denies pain    Home Living                          Prior Function            PT Goals (current goals can now be found in the care plan section) Acute Rehab PT Goals Patient Stated Goal: to return to transferring without assistance Progress towards PT goals: Progressing  toward goals    Frequency    Min 3X/week      PT Plan Current plan remains appropriate    Co-evaluation              AM-PAC PT "6 Clicks" Mobility   Outcome Measure  Help needed turning from your back to your side while in a flat bed without using bedrails?: A Little Help needed moving from lying on your back to sitting on the side of a flat bed without using bedrails?: A Lot Help needed moving to and from a bed to a chair (including a wheelchair)?: A Lot Help needed standing up from a chair using your arms (e.g., wheelchair or bedside chair)?: Total Help needed to walk in hospital room?: Total Help needed climbing 3-5 steps with a railing? : Total 6 Click Score: 10    End of Session   Activity Tolerance: Patient tolerated treatment well Patient left: in bed;with call bell/phone within reach;with bed alarm set;with family/visitor present Nurse Communication: Mobility status PT Visit Diagnosis: Other abnormalities of gait and mobility (R26.89);Hemiplegia and hemiparesis Hemiplegia - Right/Left: Left Hemiplegia - dominant/non-dominant: Non-dominant Hemiplegia - caused by: Cerebral infarction     Time: 2376-2831 PT Time Calculation (min) (ACUTE ONLY): 33 min  Charges:  $Therapeutic Activity: 8-22 mins $Wheel Chair Management: 8-22 mins                     Zenaida Niece, PT, DPT Acute Rehabilitation Office [place] 09/15/2022, 11:59 AM

## 2022-09-15 NOTE — Progress Notes (Signed)
STROKE TEAM PROGRESS NOTE   INTERVAL HISTORY Patient seen sitting up in her bed.   Her vital signs have been stable, and her neurological exam is unchanged.    Her daughter has selected [facility] skilled nursing facility but we are waiting on insurance approval    Vitals:   09/15/22 0338 09/15/22 0500 09/15/22 0746 09/15/22 1152  BP: (!) 135/58  122/79 134/67  Pulse: 93  86 80  Resp: 19  20 18   Temp: 98.8 F (37.1 C)  99.8 F (37.7 C) 98.5 F (36.9 C)  TempSrc: Oral  Oral Oral  SpO2: 93%  94% 94%  Weight:  46 kg     CBC:  Recent Labs  Lab 09/10/22 0411 09/11/22 0345  WBC 8.5 9.0  HGB 10.2* 10.0*  HCT 33.3* 31.6*  MCV 75.7* 75.2*  PLT 577* 865*   Basic Metabolic Panel:  Recent Labs  Lab 09/10/22 0411 09/11/22 0345  NA 135 133*  K 4.3 4.2  CL 104 100  CO2 22 22  GLUCOSE 135* 128*  BUN 26* 30*  CREATININE 0.69 0.70  CALCIUM 8.7* 8.7*   Lipid Panel:  No results for input(s): "CHOL", "TRIG", "HDL", "CHOLHDL", "VLDL", "LDLCALC" in the last 168 hours.  HgbA1c: No results for input(s): "HGBA1C" in the last 168 hours. Urine Drug Screen: No results for input(s): "LABOPIA", "COCAINSCRNUR", "LABBENZ", "AMPHETMU", "THCU", "LABBARB" in the last 168 hours.  Alcohol Level  No results for input(s): "ETH" in the last 168 hours.   IMAGING past 24 hours No results found.  Temp:  [97.9 F (36.6 C)-99.8 F (37.7 C)] 98.5 F (36.9 C) (01/19 1152) Pulse Rate:  [70-93] 80 (01/19 1152) Resp:  [18-20] 18 (01/19 1152) BP: (117-138)/(58-79) 134/67 (01/19 1152) SpO2:  [92 %-97 %] 94 % (01/19 1152) Weight:  [46 kg] 46 kg (01/19 0500)  General -frail elderly Caucasian lady, in no apparent distress. HEENT- atraumatic, normocephalic  Cardiovascular - Regular rhythm and rate. Pulmonary- equal chest rise; unlabored respirations.  Extremities- BLE AKA  Neuro: Mental Status -  Alert and awake.  Orientation to time, place and person intact.   Language intact. Speech moderate  dysarthria. Attention span and concentration were normal.  Right upper and right lower extremities are normal strength and movement-5/5.  Left upper and lower extremities with 2 out of 5 strength She has left facial droop.  Sensation is decreased on the left. Visual fields are intact. Her extraocular movements are intact. Right gaze preference but crosses midline. She follows commands. Gait is not observed as noted above she has a bilateral AKA.   ASSESSMENT/PLAN Ms. PHAEDRA COLGATE is a 67 y.o. female with history of CAD, GERD, CVA in March of 2023, HTN, HLD, PAD with bilateral AKA, AF on Eliquis, AICD, Sjogren's syndrome, and cardiomyopathy presenting with right gaze deviation and left-sided weakness. LKW 08/28/22 15:45.  Stroke: Acute right hemispheric infarct due to acute right ICA terminus occlusion.  Etiology is likely embolic secondary to atrial fibrillation s/p IR with TICI3 Code Stroke right M1 segment concerning for acute thrombus.  Slight asymmetric hypoattenuation in the right lentiform nucleus and insular ribbon.  This could represent early infarct. ASPECTS 8/10.   CTA head & neck occlusion of the right ICA in its distal segment without reconstitution prior to the skull base.  The right ICA is occluded through the ICA terminus.  Right M1 and A1 segments are occluded.  Minimal filling of distal right MCA branch vessels with some collaterals.  IR with ICA, MCA, and ACA achieving complete recanalization.   CT 1/2 right MCA infarct with petechial hemorrhage, interhemispheric SAH CT 1/5 decreased interhemispheric SAH, right BG infarct with petechial hemorrhage, stable. CT 1/11 stable right hemispheric infarct with no hemorrhagic transformation, virtually resolved subarachnoid hemorrhage MRI unable to obtain due to PPM. 2D Echo EF 55 to 60% LDL 82 HgbA1c 6.6 VTE prophylaxis -fully anticoagulated with Eliquis Eliquis (apixaban) daily prior to admission, now fully anticoagulated with  Eliquis Therapy recommendations: SNF Disposition: Pending  History of CVA and seizure 10/2014 admitted for focal seizure.  Patient not on AED. 10/2021 presenting with speech disturbance and right upper extremity weakness.  CT showed left large subcortical infarct.  CTA head and neck left ICA 60% stenosis.  Carotid Doppler left ICA 40 to 59% stenosis.  EF 55 to 60%.  LDL 86, A1c 6.2. It was felt that her stroke was due to PAF with Eliquis nonadherence as patient stopped taking AC as she perceived "it was not doing anything for me". Discharged on Eliquis and Crestor 40. Patient had residual dysarthria and improving RUE weakness  Dysphagia MBS- done 1/9- dysphagia 2 with honey thick liquids Cortrak in place with TF On nocturnal feeding to facilitate po intake  Paroxysmal atrial fibrillation Previously noncompliant with CVA in 10/2021 Patient endorses current compliance with Eliquis  Repeat head CT looks good with virtual resolution of subarachnoid hemorrhage, will restart Eliquis  Hypertension Home meds: Lopressor Stable  On Norvasc 10  long-term BP goal normotensive  Hyperlipidemia Home meds: Crestor 40 mg, resumed in hospital LDL 82, goal < 70 Continue statin at discharge  PAD status post bilateral AKA Right leg pain Right leg warm, no sign of ischemic limb  pain likely due to PAD versus lying in the bed without being up and about with her prosthesis as she usually is. Continue gabapentin 100->200 tid Tylenol and oxycodone as needed  Other Stroke Risk Factors Advanced Age >/= 32  Former cigarette smoker Coronary artery disease  Other Active Problems SSS status post pacemaker Sjogren syndrome Constipation - bowel regimen - fleet enema today  Hospital day # 45    Patient's family has selected skilled nursing facility  but we are waiting insurance approval.  Medically stable to be transferred to skilled nursing facility when bed available  Antony Contras, [place] Pager: [phone] 09/15/2022 3:29 PM   To contact Stroke Continuity provider, please refer to [email or web address] After hours, contact General Neurology

## 2022-09-15 NOTE — Progress Notes (Signed)
   09/15/22 1354  Spiritual Encounters  Type of Visit Follow up  Care provided to: Patient  Referral source Nurse (RN/NT/LPN)  Reason for visit Routine spiritual support  OnCall Visit No  Spiritual Framework  Presenting Themes Meaning/purpose/sources of inspiration;Goals in life/care;Values and beliefs;Significant life change;Coping tools;Courage hope and growth  Values/beliefs patient believes in the effectiveness of prayer and the constant support of god  Strengths patiet displays hope and courage, stable coping abilities and family support  Needs/Challenges/Barriers life change  Patient Stress Factors Health changes  Family Stress Factors None identified  Goals  Self/Personal Goals to leave the hospital when ready  Interventions  Spiritual Care Interventions Made Established relationship of care and support;Compassionate presence;Reflective listening;Explored values/beliefs/practices/strengths;Prayer  Intervention Outcomes  Outcomes Connection to spiritual care;Awareness around self/spiritual resourses;Connection to values and goals of care;Autonomy/agency;Awareness of health;Awareness of support  Spiritual Care Plan  Spiritual Care Issues Still Outstanding No further spiritual care needs at this time (see row info)  Follow up plan  chaplain avialable as needed   [place] visited with patient. Patient expressed hope as well as connection to her faith, beliefs and values. CH provided prayer per patient's request. No follow up needed at this time.  Mercedes [name], [place] Resident [phone]

## 2022-09-16 DIAGNOSIS — I63231 Cerebral infarction due to unspecified occlusion or stenosis of right carotid arteries: Secondary | ICD-10-CM | POA: Diagnosis not present

## 2022-09-16 LAB — GLUCOSE, CAPILLARY
Glucose-Capillary: 116 mg/dL — ABNORMAL HIGH (ref 70–99)
Glucose-Capillary: 117 mg/dL — ABNORMAL HIGH (ref 70–99)
Glucose-Capillary: 134 mg/dL — ABNORMAL HIGH (ref 70–99)
Glucose-Capillary: 129 mg/dL — ABNORMAL HIGH (ref 70–99)
Glucose-Capillary: 160 mg/dL — ABNORMAL HIGH (ref 70–99)

## 2022-09-16 MED ORDER — ACETAMINOPHEN 325 MG PO TABS
650.0000 mg | ORAL_TABLET | Freq: Four times a day (QID) | ORAL | Status: DC
  Administered 2022-09-16 – 2022-09-18 (×7): 650 mg via ORAL
  Filled 2022-09-16 (×7): qty 2

## 2022-09-16 MED ORDER — GABAPENTIN 300 MG PO CAPS
300.0000 mg | ORAL_CAPSULE | Freq: Three times a day (TID) | ORAL | Status: DC
  Administered 2022-09-16 – 2022-09-18 (×6): 300 mg via ORAL
  Filled 2022-09-16 (×6): qty 1

## 2022-09-16 MED ORDER — GABAPENTIN 300 MG PO CAPS: 300.0000 mg | ORAL_CAPSULE | Freq: Three times a day (TID) | ORAL | Status: DC

## 2022-09-16 MED ORDER — GABAPENTIN 300 MG PO CAPS
300.0000 mg | ORAL_CAPSULE | Freq: Once | ORAL | Status: AC
  Administered 2022-09-16: 300 mg via ORAL
  Filled 2022-09-16: qty 1

## 2022-09-16 NOTE — Progress Notes (Addendum)
STROKE TEAM PROGRESS NOTE   INTERVAL HISTORY Patient seen sitting up in her bed with no family at the bedside.  She has been hemodynamically stable and her neurological exam remains stable.  She continues to complain of bilateral leg pain.  Will increase gabapentin dose and scheduled Tylenol, since this appears to help.  She is awaiting insurance approval for skilled nursing facility.  Vitals:   09/16/22 0331 09/16/22 0500 09/16/22 0735 09/16/22 1230  BP: 132/69  138/72 128/67  Pulse: 81  94 65  Resp: 14  (!) 21 16  Temp: 98.5 F (36.9 C)  99.4 F (37.4 C) 97.9 F (36.6 C)  TempSrc: Oral  Oral Oral  SpO2: 95%  95% 95%  Weight:  47.7 kg     CBC:  Recent Labs  Lab 09/10/22 0411 09/11/22 0345  WBC 8.5 9.0  HGB 10.2* 10.0*  HCT 33.3* 31.6*  MCV 75.7* 75.2*  PLT 577* 639*    Basic Metabolic Panel:  Recent Labs  Lab 09/10/22 0411 09/11/22 0345  NA 135 133*  K 4.3 4.2  CL 104 100  CO2 22 22  GLUCOSE 135* 128*  BUN 26* 30*  CREATININE 0.69 0.70  CALCIUM 8.7* 8.7*    Lipid Panel:  No results for input(s): "CHOL", "TRIG", "HDL", "CHOLHDL", "VLDL", "LDLCALC" in the last 168 hours.  HgbA1c: No results for input(s): "HGBA1C" in the last 168 hours. Urine Drug Screen: No results for input(s): "LABOPIA", "COCAINSCRNUR", "LABBENZ", "AMPHETMU", "THCU", "LABBARB" in the last 168 hours.  Alcohol Level  No results for input(s): "ETH" in the last 168 hours.   IMAGING past 24 hours No results found.  Temp:  [97.8 F (36.6 C)-99.4 F (37.4 C)] 97.9 F (36.6 C) (01/20 1230) Pulse Rate:  [65-94] 65 (01/20 1230) Resp:  [14-21] 16 (01/20 1230) BP: (126-155)/(67-84) 128/67 (01/20 1230) SpO2:  [95 %-96 %] 95 % (01/20 1230) Weight:  [47.7 kg] 47.7 kg (01/20 0500)  General -frail elderly lady, in no apparent distress. HEENT- atraumatic, normocephalic  Cardiovascular - Regular rhythm and rate. Pulmonary- equal chest rise; unlabored respirations.  Extremities- BLE  AKA  Neuro: Mental Status -  Alert and awake.  Orientation to time, place and person intact.   Language intact. Speech mild dysarthria. Attention span and concentration were normal.  Right upper and right lower extremities are normal strength and movement-5/5.  2 out of 5 strength to left upper extremity and 3 out of 5 strength to left lower extremity She has left facial droop.  Sensation is decreased on the left. Visual fields are intact. Her extraocular movements are intact. Extraocular movements intact She follows commands. Gait is not observed as noted above she has a bilateral AKA.   ASSESSMENT/PLAN Mercedes [name] is a 67 y.o. female with history of CAD, GERD, CVA in March of 2023, HTN, HLD, PAD with bilateral AKA, AF on Eliquis, AICD, Sjogren's syndrome, and cardiomyopathy presenting with right gaze deviation and left-sided weakness. LKW 08/28/22 15:45.  She is currently stable for discharge and awaiting insurance approval for skilled nursing facility.  Stroke: Acute right hemispheric infarct due to acute right ICA terminus occlusion.  Etiology is likely embolic secondary to atrial fibrillation s/p IR with TICI3 Code Stroke right M1 segment concerning for acute thrombus.  Slight asymmetric hypoattenuation in the right lentiform nucleus and insular ribbon.  This could represent early infarct. ASPECTS 8/10.   CTA head & neck occlusion of the right ICA in its distal segment without reconstitution prior  to the skull base.  The right ICA is occluded through the ICA terminus.  Right M1 and A1 segments are occluded.  Minimal filling of distal right MCA branch vessels with some collaterals.   IR with ICA, MCA, and ACA achieving complete recanalization.   CT 1/2 right MCA infarct with petechial hemorrhage, interhemispheric SAH CT 1/5 decreased interhemispheric SAH, right BG infarct with petechial hemorrhage, stable. CT 1/11 stable right hemispheric infarct with no hemorrhagic  transformation, virtually resolved subarachnoid hemorrhage MRI unable to obtain due to PPM. 2D Echo EF 55 to 60% LDL 82 HgbA1c 6.6 VTE prophylaxis -fully anticoagulated with Eliquis Eliquis (apixaban) daily prior to admission, now fully anticoagulated with Eliquis Therapy recommendations: SNF Disposition: Pending  History of CVA and seizure 10/2014 admitted for focal seizure.  Patient not on AED. 10/2021 presenting with speech disturbance and right upper extremity weakness.  CT showed left large subcortical infarct.  CTA head and neck left ICA 60% stenosis.  Carotid Doppler left ICA 40 to 59% stenosis.  EF 55 to 60%.  LDL 86, A1c 6.2. It was felt that her stroke was due to PAF with Eliquis nonadherence as patient stopped taking AC as she perceived "it was not doing anything for me". Discharged on Eliquis and Crestor 40. Patient had residual dysarthria and improving RUE weakness  Dysphagia MBS- done 1/9- dysphagia 2 with honey thick liquids Cortrak in place with TF On nocturnal feeding to facilitate po intake  Paroxysmal atrial fibrillation Previously noncompliant with CVA in 10/2021 Patient endorses current compliance with Eliquis  Repeat head CT looks good with virtual resolution of subarachnoid hemorrhage, will restart Eliquis  Hypertension Home meds: Lopressor Stable  On Norvasc 10  long-term BP goal normotensive  Hyperlipidemia Home meds: Crestor 40 mg, resumed in hospital LDL 82, goal < 70 Continue statin at discharge  PAD status post bilateral AKA Right leg pain Right leg warm, no sign of ischemic limb  pain likely due to PAD versus lying in the bed without being up and about with her prosthesis as she usually is. Continue gabapentin 100->200-> 300 tid Schedule acetaminophen since this appears to help  Other Stroke Risk Factors Advanced Age >/= 26  Former cigarette smoker Coronary artery disease  Other Active Problems SSS status post pacemaker Sjogren  syndrome Constipation - bowel regimen - fleet enema effective  Hospital day # 19 Patient seen by NP and then by MD, MD to edit note is needed [place] , MSN, AGACNP-BC [facility] See Amion for schedule and pager information 09/16/2022 2:08 PM   ATTENDING NOTE: I reviewed above note and agree with the assessment and plan. Pt was seen and examined.   No family at bedside.  Patient reclining in bed, awake alert, in good spirit, no acute event overnight.  Still complaining of bilateral leg pain, right more than left, at home she takes Tylenol and effective.  Will change Tylenol to scheduled and increase gabapentin to 300 3 times daily.  Continue Eliquis and statin.  Continue PT/OT, pending SNF placement next week.  For detailed assessment and plan, please refer to above/below as I have made changes wherever appropriate.   Rosalin Hawking, MD PhD Stroke Neurology 09/16/2022 7:44 PM   To contact Stroke Continuity provider, please refer to [email or web address] After hours, contact General Neurology

## 2022-09-17 DIAGNOSIS — I63231 Cerebral infarction due to unspecified occlusion or stenosis of right carotid arteries: Secondary | ICD-10-CM | POA: Diagnosis not present

## 2022-09-17 LAB — CBC
HCT: 32.3 % — ABNORMAL LOW (ref 36.0–46.0)
Hemoglobin: 9.9 g/dL — ABNORMAL LOW (ref 12.0–15.0)
MCH: 22.5 pg — ABNORMAL LOW (ref 26.0–34.0)
MCHC: 30.7 g/dL (ref 30.0–36.0)
MCV: 73.4 fL — ABNORMAL LOW (ref 80.0–100.0)
Platelets: 577 10*3/uL — ABNORMAL HIGH (ref 150–400)
RBC: 4.4 MIL/uL (ref 3.87–5.11)
RDW: 16.6 % — ABNORMAL HIGH (ref 11.5–15.5)
WBC: 10.2 10*3/uL (ref 4.0–10.5)
nRBC: 0 % (ref 0.0–0.2)

## 2022-09-17 LAB — GLUCOSE, CAPILLARY
Glucose-Capillary: 107 mg/dL — ABNORMAL HIGH (ref 70–99)
Glucose-Capillary: 104 mg/dL — ABNORMAL HIGH (ref 70–99)
Glucose-Capillary: 143 mg/dL — ABNORMAL HIGH (ref 70–99)
Glucose-Capillary: 149 mg/dL — ABNORMAL HIGH (ref 70–99)
Glucose-Capillary: 97 mg/dL (ref 70–99)

## 2022-09-17 LAB — BASIC METABOLIC PANEL
Anion gap: 10 (ref 5–15)
BUN: 13 mg/dL (ref 8–23)
CO2: 22 mmol/L (ref 22–32)
Calcium: 8.9 mg/dL (ref 8.9–10.3)
Chloride: 99 mmol/L (ref 98–111)
Creatinine, Ser: 0.71 mg/dL (ref 0.44–1.00)
GFR, Estimated: >60 mL/min (ref >60)
Glucose, Bld: 112 mg/dL — ABNORMAL HIGH (ref 70–99)
Potassium: 4.4 mmol/L (ref 3.5–5.1)
Sodium: 131 mmol/L — ABNORMAL LOW (ref 135–145)

## 2022-09-17 NOTE — Progress Notes (Addendum)
STROKE TEAM PROGRESS NOTE   INTERVAL HISTORY Patient seen sitting up in her bed with no family at the bedside.  She reports that her leg pain has improved today with increased gabapentin and scheduled Tylenol.  She has been hemodynamically stable and her neurological exam is unchanged.  She is awaiting insurance approval for skilled nursing facility for rehab.  Vitals:   09/17/22 0500 09/17/22 0719 09/17/22 0945 09/17/22 1210  BP:  128/80 123/81 125/66  Pulse:  82 80 75  Resp:  14 19 14   Temp:  99.4 F (37.4 C)  99.1 F (37.3 C)  TempSrc:  Oral  Oral  SpO2:  99% 94% 96%  Weight: 44.3 kg      CBC:  Recent Labs  Lab 09/11/22 0345 09/17/22 1011  WBC 9.0 10.2  HGB 10.0* 9.9*  HCT 31.6* 32.3*  MCV 75.2* 73.4*  PLT 639* 577*    Basic Metabolic Panel:  Recent Labs  Lab 09/11/22 0345 09/17/22 1011  NA 133* 131*  K 4.2 4.4  CL 100 99  CO2 22 22  GLUCOSE 128* 112*  BUN 30* 13  CREATININE 0.70 0.71  CALCIUM 8.7* 8.9    Lipid Panel:  No results for input(s): "CHOL", "TRIG", "HDL", "CHOLHDL", "VLDL", "LDLCALC" in the last 168 hours.  HgbA1c: No results for input(s): "HGBA1C" in the last 168 hours. Urine Drug Screen: No results for input(s): "LABOPIA", "COCAINSCRNUR", "LABBENZ", "AMPHETMU", "THCU", "LABBARB" in the last 168 hours.  Alcohol Level  No results for input(s): "ETH" in the last 168 hours.   IMAGING past 24 hours No results found.  Temp:  [97.7 F (36.5 C)-99.4 F (37.4 C)] 99.1 F (37.3 C) (01/21 1210) Pulse Rate:  [74-94] 75 (01/21 1210) Resp:  [14-19] 14 (01/21 1210) BP: (120-143)/(64-93) 125/66 (01/21 1210) SpO2:  [90 %-99 %] 96 % (01/21 1210) Weight:  [44.3 kg] 44.3 kg (01/21 0500)  General -frail elderly lady, in no apparent distress. HEENT- atraumatic, normocephalic  Cardiovascular - Regular rhythm and rate. Pulmonary- equal chest rise; unlabored respirations.  Extremities- BLE AKA  Neuro: Mental Status -  Alert and awake.  Orientation to  time, place and person intact.   Language intact. Speech mild dysarthria. Attention span and concentration were normal.  Right upper and right lower extremities are normal strength and movement-5/5.  2 out of 5 strength to left upper extremity and 3 out of 5 strength to left lower extremity She has left facial droop.  Sensation is decreased on the left. Visual fields are intact. Her extraocular movements are intact. Extraocular movements intact She follows commands. Gait is not observed as noted above she has a bilateral AKA.   ASSESSMENT/PLAN Mercedes [name] is a 67 y.o. female with history of CAD, GERD, CVA in March of 2023, HTN, HLD, PAD with bilateral AKA, AF on Eliquis, AICD, Sjogren's syndrome, and cardiomyopathy presenting with right gaze deviation and left-sided weakness. LKW 08/28/22 15:45.  She is currently stable for discharge and awaiting insurance approval for skilled nursing facility.  Stroke: Acute right hemispheric infarct due to acute right ICA terminus occlusion.  Etiology is likely embolic secondary to atrial fibrillation s/p IR with TICI3 Code Stroke right M1 segment concerning for acute thrombus.  Slight asymmetric hypoattenuation in the right lentiform nucleus and insular ribbon.  This could represent early infarct. ASPECTS 8/10.   CTA head & neck occlusion of the right ICA in its distal segment without reconstitution prior to the skull base.  The right ICA  is occluded through the ICA terminus.  Right M1 and A1 segments are occluded.  Minimal filling of distal right MCA branch vessels with some collaterals.   IR with ICA, MCA, and ACA achieving complete recanalization.   CT 1/2 right MCA infarct with petechial hemorrhage, interhemispheric SAH CT 1/5 decreased interhemispheric SAH, right BG infarct with petechial hemorrhage, stable. CT 1/11 stable right hemispheric infarct with no hemorrhagic transformation, virtually resolved subarachnoid hemorrhage MRI unable to  obtain due to PPM. 2D Echo EF 55 to 60% LDL 82 HgbA1c 6.6 VTE prophylaxis -fully anticoagulated with Eliquis Eliquis (apixaban) daily prior to admission, now fully anticoagulated with Eliquis Therapy recommendations: SNF Disposition: Pending  History of CVA and seizure 10/2014 admitted for focal seizure.  Patient not on AED. 10/2021 presenting with speech disturbance and right upper extremity weakness.  CT showed left large subcortical infarct.  CTA head and neck left ICA 60% stenosis.  Carotid Doppler left ICA 40 to 59% stenosis.  EF 55 to 60%.  LDL 86, A1c 6.2. It was felt that her stroke was due to PAF with Eliquis nonadherence as patient stopped taking AC as she perceived "it was not doing anything for me". Discharged on Eliquis and Crestor 40. Patient had residual dysarthria and improving RUE weakness  Dysphagia MBS- done 1/9- dysphagia 2 with honey thick liquids Cortrak in place with TF On nocturnal feeding to facilitate po intake  Paroxysmal atrial fibrillation Previously noncompliant with CVA in 10/2021 Patient endorses current compliance with Eliquis  Repeat head CT looks good with virtual resolution of subarachnoid hemorrhage, Eliquis restarted  Hypertension Home meds: Lopressor Stable  On Norvasc 10  long-term BP goal normotensive  Hyperlipidemia Home meds: Crestor 40 mg, resumed in hospital LDL 82, goal < 70 Continue statin at discharge  PAD status post bilateral AKA Right leg pain Right leg warm, no sign of ischemic limb  pain likely due to PAD versus lying in the bed without being up and about with her prosthesis as she usually is. Continue gabapentin 100->200-> 300 tid Schedule acetaminophen since this appears to help  Other Stroke Risk Factors Advanced Age >/= 24  Former cigarette smoker Coronary artery disease  Other Active Problems SSS status post pacemaker Sjogren syndrome Constipation - bowel regimen - fleet enema effective  Hospital day #  20 Patient seen by NP and then by MD, MD to edit note is needed Pavo , MSN, AGACNP-BC [facility] See Amion for schedule and pager information 09/17/2022 1:14 PM   ATTENDING NOTE: I reviewed above note and agree with the assessment and plan. Pt was seen and examined.   Pt lying in bed, no acute event overnight and neuro stable unchanged. Stated b/l leg pain improved after scheduled tylenol. Continue for now. On eliquis and statin. Still pending SNF placement.   For detailed assessment and plan, please refer to above/below as I have made changes wherever appropriate.   Mercedes Hawking, MD PhD Stroke Neurology 09/17/2022 7:42 PM   To contact Stroke Continuity provider, please refer to [email or web address] After hours, contact General Neurology

## 2022-09-18 ENCOUNTER — Other Ambulatory Visit (HOSPITAL_COMMUNITY): Payer: Self-pay

## 2022-09-18 DIAGNOSIS — M7989 Other specified soft tissue disorders: Secondary | ICD-10-CM | POA: Diagnosis not present

## 2022-09-18 DIAGNOSIS — G8929 Other chronic pain: Secondary | ICD-10-CM | POA: Diagnosis not present

## 2022-09-18 DIAGNOSIS — Z888 Allergy status to other drugs, medicaments and biological substances status: Secondary | ICD-10-CM | POA: Diagnosis not present

## 2022-09-18 DIAGNOSIS — Z7901 Long term (current) use of anticoagulants: Secondary | ICD-10-CM | POA: Diagnosis not present

## 2022-09-18 DIAGNOSIS — I69354 Hemiplegia and hemiparesis following cerebral infarction affecting left non-dominant side: Secondary | ICD-10-CM | POA: Diagnosis not present

## 2022-09-18 DIAGNOSIS — R531 Weakness: Secondary | ICD-10-CM | POA: Diagnosis not present

## 2022-09-18 DIAGNOSIS — R404 Transient alteration of awareness: Secondary | ICD-10-CM | POA: Diagnosis not present

## 2022-09-18 DIAGNOSIS — I2510 Atherosclerotic heart disease of native coronary artery without angina pectoris: Secondary | ICD-10-CM | POA: Diagnosis not present

## 2022-09-18 DIAGNOSIS — I10 Essential (primary) hypertension: Secondary | ICD-10-CM | POA: Diagnosis not present

## 2022-09-18 DIAGNOSIS — I69391 Dysphagia following cerebral infarction: Secondary | ICD-10-CM

## 2022-09-18 DIAGNOSIS — Z87891 Personal history of nicotine dependence: Secondary | ICD-10-CM | POA: Diagnosis not present

## 2022-09-18 DIAGNOSIS — I480 Paroxysmal atrial fibrillation: Secondary | ICD-10-CM | POA: Diagnosis not present

## 2022-09-18 DIAGNOSIS — M3500 Sicca syndrome, unspecified: Secondary | ICD-10-CM | POA: Insufficient documentation

## 2022-09-18 DIAGNOSIS — R509 Fever, unspecified: Secondary | ICD-10-CM | POA: Diagnosis not present

## 2022-09-18 DIAGNOSIS — Z7401 Bed confinement status: Secondary | ICD-10-CM | POA: Diagnosis not present

## 2022-09-18 DIAGNOSIS — Z89611 Acquired absence of right leg above knee: Secondary | ICD-10-CM | POA: Diagnosis not present

## 2022-09-18 DIAGNOSIS — Z1331 Encounter for screening for depression: Secondary | ICD-10-CM | POA: Diagnosis not present

## 2022-09-18 DIAGNOSIS — Z1152 Encounter for screening for COVID-19: Secondary | ICD-10-CM | POA: Diagnosis not present

## 2022-09-18 DIAGNOSIS — Z91048 Other nonmedicinal substance allergy status: Secondary | ICD-10-CM | POA: Diagnosis not present

## 2022-09-18 DIAGNOSIS — Z95810 Presence of automatic (implantable) cardiac defibrillator: Secondary | ICD-10-CM | POA: Diagnosis not present

## 2022-09-18 DIAGNOSIS — F325 Major depressive disorder, single episode, in full remission: Secondary | ICD-10-CM | POA: Diagnosis not present

## 2022-09-18 DIAGNOSIS — Z4781 Encounter for orthopedic aftercare following surgical amputation: Secondary | ICD-10-CM | POA: Diagnosis not present

## 2022-09-18 DIAGNOSIS — Z743 Need for continuous supervision: Secondary | ICD-10-CM | POA: Diagnosis not present

## 2022-09-18 DIAGNOSIS — Z8673 Personal history of transient ischemic attack (TIA), and cerebral infarction without residual deficits: Secondary | ICD-10-CM | POA: Diagnosis not present

## 2022-09-18 DIAGNOSIS — E43 Unspecified severe protein-calorie malnutrition: Secondary | ICD-10-CM | POA: Diagnosis not present

## 2022-09-18 DIAGNOSIS — Z89612 Acquired absence of left leg above knee: Secondary | ICD-10-CM | POA: Diagnosis not present

## 2022-09-18 DIAGNOSIS — G935 Compression of brain: Secondary | ICD-10-CM | POA: Diagnosis not present

## 2022-09-18 DIAGNOSIS — R1310 Dysphagia, unspecified: Secondary | ICD-10-CM | POA: Diagnosis not present

## 2022-09-18 DIAGNOSIS — R0989 Other specified symptoms and signs involving the circulatory and respiratory systems: Secondary | ICD-10-CM | POA: Diagnosis not present

## 2022-09-18 DIAGNOSIS — L02224 Furuncle of groin: Secondary | ICD-10-CM | POA: Diagnosis not present

## 2022-09-18 DIAGNOSIS — J1281 Pneumonia due to SARS-associated coronavirus: Secondary | ICD-10-CM | POA: Diagnosis not present

## 2022-09-18 DIAGNOSIS — Z91040 Latex allergy status: Secondary | ICD-10-CM | POA: Diagnosis not present

## 2022-09-18 DIAGNOSIS — R070 Pain in throat: Secondary | ICD-10-CM | POA: Diagnosis not present

## 2022-09-18 DIAGNOSIS — I739 Peripheral vascular disease, unspecified: Secondary | ICD-10-CM | POA: Diagnosis not present

## 2022-09-18 DIAGNOSIS — R69 Illness, unspecified: Secondary | ICD-10-CM | POA: Diagnosis not present

## 2022-09-18 DIAGNOSIS — I4891 Unspecified atrial fibrillation: Secondary | ICD-10-CM | POA: Diagnosis not present

## 2022-09-18 DIAGNOSIS — I252 Old myocardial infarction: Secondary | ICD-10-CM | POA: Diagnosis not present

## 2022-09-18 DIAGNOSIS — I482 Chronic atrial fibrillation, unspecified: Secondary | ICD-10-CM | POA: Diagnosis not present

## 2022-09-18 DIAGNOSIS — E785 Hyperlipidemia, unspecified: Secondary | ICD-10-CM | POA: Diagnosis not present

## 2022-09-18 DIAGNOSIS — M79606 Pain in leg, unspecified: Secondary | ICD-10-CM | POA: Diagnosis not present

## 2022-09-18 DIAGNOSIS — I63231 Cerebral infarction due to unspecified occlusion or stenosis of right carotid arteries: Secondary | ICD-10-CM | POA: Diagnosis not present

## 2022-09-18 DIAGNOSIS — M79605 Pain in left leg: Secondary | ICD-10-CM | POA: Diagnosis not present

## 2022-09-18 DIAGNOSIS — U071 COVID-19: Secondary | ICD-10-CM | POA: Diagnosis not present

## 2022-09-18 DIAGNOSIS — K219 Gastro-esophageal reflux disease without esophagitis: Secondary | ICD-10-CM | POA: Diagnosis not present

## 2022-09-18 DIAGNOSIS — R059 Cough, unspecified: Secondary | ICD-10-CM | POA: Diagnosis not present

## 2022-09-18 DIAGNOSIS — Z79899 Other long term (current) drug therapy: Secondary | ICD-10-CM | POA: Diagnosis not present

## 2022-09-18 DIAGNOSIS — M069 Rheumatoid arthritis, unspecified: Secondary | ICD-10-CM | POA: Diagnosis not present

## 2022-09-18 DIAGNOSIS — Z950 Presence of cardiac pacemaker: Secondary | ICD-10-CM | POA: Diagnosis not present

## 2022-09-18 DIAGNOSIS — R918 Other nonspecific abnormal finding of lung field: Secondary | ICD-10-CM | POA: Diagnosis not present

## 2022-09-18 LAB — GLUCOSE, CAPILLARY
Glucose-Capillary: 96 mg/dL (ref 70–99)
Glucose-Capillary: 90 mg/dL (ref 70–99)
Glucose-Capillary: 101 mg/dL — ABNORMAL HIGH (ref 70–99)

## 2022-09-18 MED ORDER — AMLODIPINE BESYLATE 10 MG PO TABS
10.0000 mg | ORAL_TABLET | Freq: Every day | ORAL | 0 refills | Status: DC
  Filled 2022-09-18: qty 30, 30d supply, fill #0

## 2022-09-18 MED ORDER — BISACODYL 10 MG RE SUPP
10.0000 mg | Freq: Every day | RECTAL | 0 refills | Status: DC | PRN
  Filled 2022-09-18: qty 12, 12d supply, fill #0

## 2022-09-18 MED ORDER — POLYETHYLENE GLYCOL 3350 17 G PO PACK
17.0000 g | PACK | Freq: Every day | ORAL | 0 refills | Status: AC
  Filled 2022-09-18: qty 14, 14d supply, fill #0

## 2022-09-18 MED ORDER — GABAPENTIN 300 MG PO CAPS
300.0000 mg | ORAL_CAPSULE | Freq: Three times a day (TID) | ORAL | 0 refills | Status: DC
  Filled 2022-09-18: qty 30, 10d supply, fill #0

## 2022-09-18 MED ORDER — SENNOSIDES-DOCUSATE SODIUM 8.6-50 MG PO TABS
1.0000 | ORAL_TABLET | Freq: Every evening | ORAL | 0 refills | Status: DC | PRN
  Filled 2022-09-18: qty 14, 14d supply, fill #0

## 2022-09-18 MED ORDER — DOCUSATE SODIUM 100 MG PO CAPS
100.0000 mg | ORAL_CAPSULE | Freq: Two times a day (BID) | ORAL | 0 refills | Status: DC
  Filled 2022-09-18: qty 10, 5d supply, fill #0

## 2022-09-18 NOTE — TOC Transition Note (Signed)
Transition of Care [facility]) - CM/SW Discharge Note   Patient Details  Name: Mercedes [name] MRN: [id number] Date of Birth: September 26, 1955  Transition of Care [facility]) CM/SW Contact:  Vinie Sill, LCSW Phone Number: 09/18/2022, 2:04 PM   Clinical Narrative:     Patient will Discharge to: Chanda Busing Discharge Date: 09/18/2022 Family Notified: daughter Transport By: Corey Harold  Per MD patient is ready for discharge. RN, patient, and facility notified of discharge. Discharge Summary sent to facility. RN given number for [phone], Room 306-A. Ambulance transport requested for patient.   Clinical Social Worker signing off.  Thurmond Butts, MSW, LCSW Clinical Social Worker     Final next level of care: Skilled Nursing Facility Barriers to Discharge: Barriers Resolved   Patient Goals and CMS Choice CMS Medicare.gov Compare Post Acute Care list provided to:: Patient Represenative (must comment) Choice offered to / list presented to : Adult Children  Discharge Placement                Patient [facility] bed at:  [facility]) Patient to be transferred to facility by: [place] Name of family member notified: daughter Patient and family notified of of transfer: 09/18/22  Discharge Plan and Services Additional resources added to the After Visit Summary for   In-house Referral: Clinical Social Work   Post Acute Care Choice: [place]                               Social Determinants of Health ([place]) Interventions SDOH Screenings   Food Insecurity: No Food Insecurity (07/15/2021)  Housing: Low Risk  (07/15/2021)  Transportation Needs: No Transportation Needs (07/15/2021)  Alcohol Screen: Low Risk  (07/15/2021)  Depression (PHQ2-9): Low Risk  (11/08/2021)  Financial Resource Strain: Low Risk  (07/15/2021)  Physical Activity: Inactive (07/15/2021)  Social Connections: Unknown (07/15/2021)  Stress: No Stress Concern Present (07/15/2021)  Tobacco Use:  Medium Risk (08/29/2022)     Readmission Risk Interventions    03/17/2022   10:38 AM 09/08/2020   12:03 PM  Readmission Risk Prevention Plan  Post Dischage Appt Complete   Medication Screening Complete   Transportation Screening Complete Complete  PCP or Specialist Appt within 5-7 Days  Complete  Home Care Screening  Complete  Medication Review (RN CM)  Complete

## 2022-09-18 NOTE — TOC Progression Note (Signed)
Transition of Care [facility]) - Progression Note    Patient Details  Name: Mercedes [name] MRN: [id number] Date of Birth: 1956/02/24  Transition of Care [facility]) CM/SW [place], [place] Phone Number: 09/18/2022, 12:33 PM  Clinical Narrative:     CSW informed by CMA- Received insurance authorization - updated Chanda Busing # Ms Bertz has been approved 1/22 -1/29 NRD 1/29 Ubaldo Glassing is your contact [phone] Fax [phone]   Reference [id number]      Expected Discharge Plan: [place] Barriers to Discharge: Insurance Authorization  Expected Discharge Plan and Services In-house Referral: Clinical Social Work   Post Acute Care Choice: [place] Living arrangements for the past 2 months: [place]                                       Social Determinants of Health (SDOH) Interventions [place]: No Food Insecurity (07/15/2021)  Housing: Low Risk  (07/15/2021)  Transportation Needs: No Transportation Needs (07/15/2021)  Alcohol Screen: Low Risk  (07/15/2021)  Depression (PHQ2-9): Low Risk  (11/08/2021)  Financial Resource Strain: Low Risk  (07/15/2021)  Physical Activity: Inactive (07/15/2021)  Social Connections: Unknown (07/15/2021)  Stress: No Stress Concern Present (07/15/2021)  Tobacco Use: Medium Risk (08/29/2022)    Readmission Risk Interventions    03/17/2022   10:38 AM 09/08/2020   12:03 PM  Readmission Risk Prevention Plan  Post Dischage Appt Complete   Medication Screening Complete   Transportation Screening Complete Complete  PCP or Specialist Appt within 5-7 Days  Complete  Home Care Screening  Complete  Medication Review (RN CM)  Complete

## 2022-09-18 NOTE — Plan of Care (Signed)
  Problem: Education: Goal: Knowledge of disease or condition will improve Outcome: Progressing Goal: Knowledge of secondary prevention will improve (MUST DOCUMENT ALL) Outcome: Progressing Goal: Knowledge of patient specific risk factors will improve (Mark N/A or DELETE if not current risk factor) Outcome: Progressing   Problem: Ischemic Stroke/TIA Tissue Perfusion: Goal: Complications of ischemic stroke/TIA will be minimized Outcome: Progressing   Problem: Coping: Goal: Will verbalize positive feelings about self Outcome: Progressing Goal: Will identify appropriate support needs Outcome: Progressing   Problem: Health Behavior/Discharge Planning: Goal: Ability to manage health-related needs will improve Outcome: Progressing Goal: Goals will be collaboratively established with patient/family Outcome: Progressing   Problem: Self-Care: Goal: Ability to participate in self-care as condition permits will improve Outcome: Progressing Goal: Verbalization of feelings and concerns over difficulty with self-care will improve Outcome: Progressing Goal: Ability to communicate needs accurately will improve Outcome: Progressing   Problem: Nutrition: Goal: Risk of aspiration will decrease Outcome: Progressing Goal: Dietary intake will improve Outcome: Progressing   Problem: Education: Goal: Understanding of CV disease, CV risk reduction, and recovery process will improve Outcome: Progressing Goal: Individualized Educational Video(s) Outcome: Progressing   Problem: Activity: Goal: Ability to return to baseline activity level will improve Outcome: Progressing   Problem: Cardiovascular: Goal: Ability to achieve and maintain adequate cardiovascular perfusion will improve Outcome: Progressing Goal: Vascular access site(s) Level 0-1 will be maintained Outcome: Progressing   Problem: Health Behavior/Discharge Planning: Goal: Ability to safely manage health-related needs after  discharge will improve Outcome: Progressing   Problem: Education: Goal: Knowledge of General Education information will improve Description: Including pain rating scale, medication(s)/side effects and non-pharmacologic comfort measures Outcome: Progressing   Problem: Health Behavior/Discharge Planning: Goal: Ability to manage health-related needs will improve Outcome: Progressing   Problem: Clinical Measurements: Goal: Ability to maintain clinical measurements within normal limits will improve Outcome: Progressing Goal: Will remain free from infection Outcome: Progressing Goal: Diagnostic test results will improve Outcome: Progressing Goal: Respiratory complications will improve Outcome: Progressing Goal: Cardiovascular complication will be avoided Outcome: Progressing   Problem: Activity: Goal: Risk for activity intolerance will decrease Outcome: Progressing   Problem: Nutrition: Goal: Adequate nutrition will be maintained Outcome: Progressing   Problem: Coping: Goal: Level of anxiety will decrease Outcome: Progressing   Problem: Elimination: Goal: Will not experience complications related to bowel motility Outcome: Progressing Goal: Will not experience complications related to urinary retention Outcome: Progressing   Problem: Pain Managment: Goal: General experience of comfort will improve Outcome: Progressing   Problem: Safety: Goal: Ability to remain free from injury will improve Outcome: Progressing   Problem: Skin Integrity: Goal: Risk for impaired skin integrity will decrease Outcome: Progressing   

## 2022-09-18 NOTE — Progress Notes (Signed)
Pt picked up by [facility] to be transported off to disposition via stretcher with belongings to the side including pt's bilateral prosthetic legs. Delia Heady RN

## 2022-09-18 NOTE — Progress Notes (Signed)
Pt discharge education and instructions completed. Pt discharge to [facility] and report called off to nurse Lauren at the facility. Pt IV and telemetry off. Pt bathed prior to discharge. Pt waiting on [facility] to come transport to disposition. Will continue to closely monitor pt till pick up. P. Angelica Pou RN

## 2022-09-18 NOTE — Progress Notes (Signed)
Speech Language Pathology Treatment:    Patient Details Name: Mercedes [name] MRN: [id number] DOB: [birth year] Today's Date: 09/18/2022 Time: 6063-0160 SLP Time Calculation (min) (ACUTE ONLY): 20 min  Assessment / Plan / Recommendation Clinical Impression  Mercedes [name] was seen for dysphagia tx this am. She was alert, pleasant, demonstrated recall of results of MBS last week.  Demonstrates improved oral control with no anterior spilling/leakage when self-feeding nectar liquids and Magic Cup. She appears in general to be protecting airway well, and was able to recall the necessity of small sips/bites to allow better control. There was only one incident of coughing during session, after which she reported "that one got away from me!"  She required verbal review of precautions and was able to restate them immediately after discussion.  Recommend continuing dysphagia 2/nectars - she will likely be D/Cd to SNF on this diet and will need to return as an OP in several weeks for another MBS.   HPI HPI: 67 yo F presenting 08/28/22 with L sided weakness, not speaking, R gaze preference. 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 Pt was evaluated by SLP in March 2023 and found to have mild-moderate dysarthria; cognitive-linguistic function and swallowing WFL. PMH includes: L frontal CVA, GERD, sjogren's syndrome, B AKA, takotsubo CM, SLE, RA, PAD, MI, HTN, CAD, anxiety, depression, chiari malformation. MBS 1/9 revealed moderate risk of dyspagia due to oropharyngeal dyspagia characterized by poor coordination for swallow and facial weakness causing anterior loss.      SLP Plan  Continue with current plan of care      Recommendations for follow up therapy are one component of a multi-disciplinary discharge planning process, led by the attending physician.  Recommendations may be updated based on patient status, additional functional criteria and insurance authorization.    Recommendations  Diet  recommendations: Dysphagia 2 (fine chop);Nectar-thick liquid Liquids provided via: Straw;Cup Medication Administration: Whole meds with puree Supervision: Patient able to self feed (assist with set-up) Compensations: Small sips/bites;Lingual sweep for clearance of pocketing                Oral Care Recommendations: Oral care BID Follow Up Recommendations: Skilled nursing-short term rehab (<3 hours/day) Assistance recommended at discharge: Frequent or constant Supervision/Assistance SLP Visit Diagnosis: Dysphagia, oropharyngeal phase (R13.12) Plan: Continue with current plan of care         Mercedes [name] L. Tivis Ringer, MA CCC/SLP Clinical Specialist - Acute Care SLP Acute Rehabilitation Services Office number [phone]   Mercedes [name]  09/18/2022, 9:48 AM

## 2022-09-18 NOTE — TOC Progression Note (Signed)
Transition of Care [facility]) - Progression Note    Patient Details  Name: Mercedes [name] MRN: [id number] Date of Birth: [birth year]  Transition of Care [facility]) CM/SW [place], [place] Phone Number: 09/18/2022, 11:35 AM  Clinical Narrative:     Authorization still pending   TOC will continue to follow   Thurmond Butts, MSW, LCSW Clinical Social Worker    Expected Discharge Plan: [place] Barriers to Discharge: Insurance Authorization  Expected Discharge Plan and Services In-house Referral: Clinical Social Work   Post Acute Care Choice: [place] Living arrangements for the past 2 months: [place] Determinants of Health (SDOH) Interventions [place]: No Food Insecurity (07/15/2021)  Housing: Low Risk  (07/15/2021)  Transportation Needs: No Transportation Needs (07/15/2021)  Alcohol Screen: Low Risk  (07/15/2021)  Depression (PHQ2-9): Low Risk  (11/08/2021)  Financial Resource Strain: Low Risk  (07/15/2021)  Physical Activity: Inactive (07/15/2021)  Social Connections: Unknown (07/15/2021)  Stress: No Stress Concern Present (07/15/2021)  Tobacco Use: Medium Risk (08/29/2022)    Readmission Risk Interventions    03/17/2022   10:38 AM 09/08/2020   12:03 PM  Readmission Risk Prevention Plan  Post Dischage Appt Complete   Medication Screening Complete   Transportation Screening Complete Complete  PCP or Specialist Appt within 5-7 Days  Complete  Home Care Screening  Complete  Medication Review (RN CM)  Complete

## 2022-09-18 NOTE — Care Management Important Message (Signed)
Important Message  Patient Details  Name: Mercedes [name] MRN: [id number] Date of Birth: 1956/02/24   Medicare Important Message Given:  Yes     Hannah Beat 09/18/2022, 3:18 PM

## 2022-09-19 DIAGNOSIS — I252 Old myocardial infarction: Secondary | ICD-10-CM | POA: Diagnosis not present

## 2022-09-19 DIAGNOSIS — Z89612 Acquired absence of left leg above knee: Secondary | ICD-10-CM | POA: Diagnosis not present

## 2022-09-19 DIAGNOSIS — Z950 Presence of cardiac pacemaker: Secondary | ICD-10-CM | POA: Diagnosis not present

## 2022-09-19 DIAGNOSIS — Z7901 Long term (current) use of anticoagulants: Secondary | ICD-10-CM | POA: Diagnosis not present

## 2022-09-19 DIAGNOSIS — G935 Compression of brain: Secondary | ICD-10-CM | POA: Diagnosis not present

## 2022-09-19 DIAGNOSIS — I69354 Hemiplegia and hemiparesis following cerebral infarction affecting left non-dominant side: Secondary | ICD-10-CM | POA: Diagnosis not present

## 2022-09-19 DIAGNOSIS — I739 Peripheral vascular disease, unspecified: Secondary | ICD-10-CM | POA: Diagnosis not present

## 2022-09-19 DIAGNOSIS — Z4781 Encounter for orthopedic aftercare following surgical amputation: Secondary | ICD-10-CM | POA: Diagnosis not present

## 2022-09-19 DIAGNOSIS — R69 Illness, unspecified: Secondary | ICD-10-CM | POA: Diagnosis not present

## 2022-09-19 DIAGNOSIS — E785 Hyperlipidemia, unspecified: Secondary | ICD-10-CM | POA: Diagnosis not present

## 2022-09-19 DIAGNOSIS — I2510 Atherosclerotic heart disease of native coronary artery without angina pectoris: Secondary | ICD-10-CM | POA: Diagnosis not present

## 2022-09-19 DIAGNOSIS — I480 Paroxysmal atrial fibrillation: Secondary | ICD-10-CM | POA: Diagnosis not present

## 2022-09-19 NOTE — NC FL2 (Signed)
[place] LEVEL OF CARE FORM     IDENTIFICATION  Patient Name: Mercedes [name] Birthdate: November 08, 1955 Sex: female Admission Date (Current Location): 08/28/2022  [facility] and [place] Number:  Herbalist and Address:  [facility], [place] [address] [place], [place] [address]      Provider Number: [id number]  Attending Physician Name and Address:  No att. providers found  Relative Name and Phone Number:       Current Level of Care: Hospital Recommended Level of Care: [place] Prior Approval Number:    Date Approved/Denied:   PASRR Number: [id number]  Discharge Plan: SNF    Current Diagnoses: Patient Active Problem List   Diagnosis Date Noted   Sjogren's disease ([place]) 09/18/2022   Dysphagia 09/18/2022   Protein-calorie malnutrition, severe 08/30/2022   Acute ischemic right ICA stroke ([place]) 08/28/2022   History of stroke 12/14/2021   S/P AKA (above knee amputation) bilateral ([name]) 12/14/2021   Ulceration of stump of above knee amputation ([place]) 11/09/2021   S/P AKA (above knee amputation), right ([place]), 11/23/20 12/14/2020   Ischemia of extremity 10/29/2020   S/P AKA (above knee amputation), left ([place]), 10/01/19 10/20/2019   Femoral-popliteal bypass graft occlusion, left ([place]) 09/25/2019   Peripheral arterial disease ([place]) 09/25/2019   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[place]) 03/09/2010   PVD- s/p Lt [facility] April 2015 10/29/2009   Dyslipidemia 08/26/2009   Essential hypertension 08/26/2009   CORONARY ATHEROSCLEROSIS NATIVE CORONARY ARTERY 08/26/2009   Secondary cardiomyopathy ([place]) 08/26/2009   Rheumatoid arthritis ([place]) 08/26/2009    Orientation RESPIRATION BLADDER Height & Weight     Self, Time, Situation  Normal External catheter, Incontinent Weight: 96 lb 1.9 oz (43.6 kg) Height:     BEHAVIORAL SYMPTOMS/MOOD NEUROLOGICAL BOWEL NUTRITION STATUS      Continent Diet (please see discharge summary)  AMBULATORY STATUS COMMUNICATION OF NEEDS Skin   Limited Assist Verbally Normal                       Personal Care Assistance Level of Assistance  Bathing, Feeding, Dressing Bathing Assistance: Maximum assistance Feeding assistance: Limited assistance Dressing Assistance: Limited assistance     Functional Limitations Info  Sight, Hearing, Speech Sight Info: Adequate Hearing Info: Adequate Speech Info: Impaired    SPECIAL CARE FACTORS FREQUENCY  PT (By licensed PT), OT (By licensed OT)     PT Frequency: 5x per week OT Frequency: 5x per week            Contractures Contractures Info: Not present    Additional Factors Info  Code Status, Allergies Code Status Info: FULL Allergies Info: Latex, tape,Brilinta           Current Medications (09/19/2022):  This is the current hospital active medication list No current facility-administered medications for  this encounter.   Current Outpatient Medications  Medication Sig Dispense Refill   acetaminophen (TYLENOL) 500 MG tablet Take by mouth.     ELIQUIS 5 MG TABS tablet TAKE 1 TABLET BY MOUTH TWICE  DAILY 180 tablet 3   nitroGLYCERIN (NITROSTAT) 0.4 MG SL tablet Place 1 tablet (0.4 mg total) under the tongue every 5 (five) minutes as needed for chest pain. 25 tablet 11   rosuvastatin  (CRESTOR) 40 MG tablet Take 1 tablet (40 mg total) by mouth daily. 90 tablet 3   amLODipine (NORVASC) 10 MG tablet Take 1 tablet (10 mg total) by mouth daily. 30 tablet 0   bisacodyl (DULCOLAX) 10 MG suppository Place 1 suppository (10 mg total) rectally daily as needed for moderate constipation. 12 suppository 0   docusate sodium (COLACE) 100 MG capsule Take 1 capsule (100 mg total) by mouth 2 (two) times daily. 10 capsule 0   gabapentin (NEURONTIN) 300 MG capsule Take 1 capsule (300 mg total) by mouth every 8 (eight) hours. 30 capsule 0   polyethylene glycol (MIRALAX / GLYCOLAX) 17 g packet Take 17 g by mouth daily. 14 each 0   senna-docusate (SENOKOT-S) 8.6-50 MG tablet Take 1 tablet by mouth at bedtime as needed for mild constipation. 14 tablet 0     Discharge Medications: Please see discharge summary for a list of discharge medications.  Relevant Imaging Results:  Relevant Lab Results:   Additional Information SSN [id number]  Vinie Sill, LCSW

## 2022-09-23 DIAGNOSIS — U071 COVID-19: Secondary | ICD-10-CM | POA: Diagnosis not present

## 2022-09-23 DIAGNOSIS — E785 Hyperlipidemia, unspecified: Secondary | ICD-10-CM | POA: Diagnosis not present

## 2022-09-23 DIAGNOSIS — R0989 Other specified symptoms and signs involving the circulatory and respiratory systems: Secondary | ICD-10-CM | POA: Diagnosis not present

## 2022-09-23 DIAGNOSIS — Z888 Allergy status to other drugs, medicaments and biological substances status: Secondary | ICD-10-CM | POA: Diagnosis not present

## 2022-09-23 DIAGNOSIS — Z7901 Long term (current) use of anticoagulants: Secondary | ICD-10-CM | POA: Diagnosis not present

## 2022-09-23 DIAGNOSIS — Z89611 Acquired absence of right leg above knee: Secondary | ICD-10-CM | POA: Diagnosis not present

## 2022-09-23 DIAGNOSIS — R509 Fever, unspecified: Secondary | ICD-10-CM | POA: Diagnosis not present

## 2022-09-23 DIAGNOSIS — I739 Peripheral vascular disease, unspecified: Secondary | ICD-10-CM | POA: Diagnosis not present

## 2022-09-23 DIAGNOSIS — K219 Gastro-esophageal reflux disease without esophagitis: Secondary | ICD-10-CM | POA: Diagnosis not present

## 2022-09-23 DIAGNOSIS — M3500 Sicca syndrome, unspecified: Secondary | ICD-10-CM | POA: Diagnosis not present

## 2022-09-23 DIAGNOSIS — R918 Other nonspecific abnormal finding of lung field: Secondary | ICD-10-CM | POA: Diagnosis not present

## 2022-09-23 DIAGNOSIS — Z91040 Latex allergy status: Secondary | ICD-10-CM | POA: Diagnosis not present

## 2022-09-23 DIAGNOSIS — R059 Cough, unspecified: Secondary | ICD-10-CM | POA: Diagnosis not present

## 2022-09-23 DIAGNOSIS — Z79899 Other long term (current) drug therapy: Secondary | ICD-10-CM | POA: Diagnosis not present

## 2022-09-23 DIAGNOSIS — Z87891 Personal history of nicotine dependence: Secondary | ICD-10-CM | POA: Diagnosis not present

## 2022-09-23 DIAGNOSIS — Z1152 Encounter for screening for COVID-19: Secondary | ICD-10-CM | POA: Diagnosis not present

## 2022-09-23 DIAGNOSIS — Z8673 Personal history of transient ischemic attack (TIA), and cerebral infarction without residual deficits: Secondary | ICD-10-CM | POA: Diagnosis not present

## 2022-09-23 DIAGNOSIS — I2510 Atherosclerotic heart disease of native coronary artery without angina pectoris: Secondary | ICD-10-CM | POA: Diagnosis not present

## 2022-09-23 DIAGNOSIS — Z91048 Other nonmedicinal substance allergy status: Secondary | ICD-10-CM | POA: Diagnosis not present

## 2022-09-23 DIAGNOSIS — M79606 Pain in leg, unspecified: Secondary | ICD-10-CM | POA: Diagnosis not present

## 2022-09-23 DIAGNOSIS — Z95810 Presence of automatic (implantable) cardiac defibrillator: Secondary | ICD-10-CM | POA: Diagnosis not present

## 2022-09-23 DIAGNOSIS — I10 Essential (primary) hypertension: Secondary | ICD-10-CM | POA: Diagnosis not present

## 2022-09-23 DIAGNOSIS — I4891 Unspecified atrial fibrillation: Secondary | ICD-10-CM | POA: Diagnosis not present

## 2022-09-23 DIAGNOSIS — J1281 Pneumonia due to SARS-associated coronavirus: Secondary | ICD-10-CM | POA: Diagnosis not present

## 2022-09-23 DIAGNOSIS — R531 Weakness: Secondary | ICD-10-CM | POA: Diagnosis not present

## 2022-09-23 DIAGNOSIS — Z89612 Acquired absence of left leg above knee: Secondary | ICD-10-CM | POA: Diagnosis not present

## 2022-10-03 DIAGNOSIS — I69354 Hemiplegia and hemiparesis following cerebral infarction affecting left non-dominant side: Secondary | ICD-10-CM | POA: Diagnosis not present

## 2022-10-03 DIAGNOSIS — L02224 Furuncle of groin: Secondary | ICD-10-CM | POA: Diagnosis not present

## 2022-10-03 DIAGNOSIS — M7989 Other specified soft tissue disorders: Secondary | ICD-10-CM | POA: Diagnosis not present

## 2022-10-11 DIAGNOSIS — I69354 Hemiplegia and hemiparesis following cerebral infarction affecting left non-dominant side: Secondary | ICD-10-CM | POA: Diagnosis not present

## 2022-10-11 DIAGNOSIS — Z4781 Encounter for orthopedic aftercare following surgical amputation: Secondary | ICD-10-CM | POA: Diagnosis not present

## 2022-10-11 DIAGNOSIS — Z7901 Long term (current) use of anticoagulants: Secondary | ICD-10-CM | POA: Diagnosis not present

## 2022-10-11 DIAGNOSIS — I482 Chronic atrial fibrillation, unspecified: Secondary | ICD-10-CM | POA: Diagnosis not present

## 2022-10-11 DIAGNOSIS — Z89612 Acquired absence of left leg above knee: Secondary | ICD-10-CM | POA: Diagnosis not present

## 2022-10-11 DIAGNOSIS — L02224 Furuncle of groin: Secondary | ICD-10-CM | POA: Diagnosis not present

## 2022-10-11 DIAGNOSIS — I2510 Atherosclerotic heart disease of native coronary artery without angina pectoris: Secondary | ICD-10-CM | POA: Diagnosis not present

## 2022-10-16 DIAGNOSIS — Z1331 Encounter for screening for depression: Secondary | ICD-10-CM | POA: Diagnosis not present

## 2022-10-16 DIAGNOSIS — M7989 Other specified soft tissue disorders: Secondary | ICD-10-CM | POA: Diagnosis not present

## 2022-10-26 ENCOUNTER — Encounter: Payer: Self-pay | Admitting: Internal Medicine

## 2022-10-26 NOTE — Patient Instructions (Signed)
      Blood work was ordered.   The lab is on the first floor.    Medications changes include :       A referral was ordered for XXX.     Someone will call you to schedule an appointment.    No follow-ups on file.  

## 2022-10-26 NOTE — Progress Notes (Signed)
Subjective:    Patient ID: Mercedes [name], female    DOB: 1956-05-20, 67 y.o.   MRN: [id number]     HPI Mercedes [name] is here for follow up from the hospital   Admitted 08/28/22 - 09/18/22 for acute right hemispheric infarct due to acute right ICA terminus occlusion - likely embolic secndary to Afib s/p IR w/ [id number].    Presented with right gaze deviation and left side weakness.  Ct showed right ICA occlusion.  Taken to IR for mechanical thrombectomy.  IR achiieved complete recanalization.  Follow up Ct with right CMA infarct w/ petechial hemorhage, interhemispheric SAH.  Follow up CT w/ decreased interhemispheric SAH, right infarct stable with petechial hemorrhage stable.  Follow up Ct stable w/o hemorrhagic transformation, almost resolve SAH.   Dysphagia - MBS done - dysphagia 2 w honey thick liquids  Discharged with residual dysarthria and improving RUE weakness.    Discharged to SNF     Medications and allergies reviewed with patient and updated if appropriate.  Current Outpatient Medications on File Prior to Visit  Medication Sig Dispense Refill   acetaminophen (TYLENOL) 500 MG tablet Take by mouth.     amLODipine (NORVASC) 10 MG tablet Take 1 tablet (10 mg total) by mouth daily. 30 tablet 0   bisacodyl (DULCOLAX) 10 MG suppository Place 1 suppository (10 mg total) rectally daily as needed for moderate constipation. 12 suppository 0   docusate sodium (COLACE) 100 MG capsule Take 1 capsule (100 mg total) by mouth 2 (two) times daily. 10 capsule 0   ELIQUIS 5 MG TABS tablet TAKE 1 TABLET BY MOUTH TWICE  DAILY 180 tablet 3   gabapentin (NEURONTIN) 300 MG capsule Take 1 capsule (300 mg total) by mouth every 8 (eight) hours. 30 capsule 0   nitroGLYCERIN (NITROSTAT) 0.4 MG SL tablet Place 1 tablet (0.4 mg total) under the tongue every 5 (five) minutes as needed for chest pain. 25 tablet 11   polyethylene glycol (MIRALAX / GLYCOLAX) 17 g packet Take 17 g by mouth daily. 14 each 0    rosuvastatin (CRESTOR) 40 MG tablet Take 1 tablet (40 mg total) by mouth daily. 90 tablet 3   senna-docusate (SENOKOT-S) 8.6-50 MG tablet Take 1 tablet by mouth at bedtime as needed for mild constipation. 14 tablet 0   No current facility-administered medications on file prior to visit.     Review of Systems     Objective:  There were no vitals filed for this visit. BP Readings from Last 3 Encounters:  09/18/22 122/84  07/05/22 134/80  06/29/22 (!) 166/80   Wt Readings from Last 3 Encounters:  09/18/22 96 lb 1.9 oz (43.6 kg)  06/29/22 94 lb (42.6 kg)  04/12/22 94 lb (42.6 kg)   There is no height or weight on file to calculate BMI.    Physical Exam     Lab Results  Component Value Date   WBC 10.2 09/17/2022   HGB 9.9 (L) 09/17/2022   HCT 32.3 (L) 09/17/2022   PLT 577 (H) 09/17/2022   GLUCOSE 112 (H) 09/17/2022   CHOL 144 08/29/2022   TRIG 88 08/29/2022   HDL 44 08/29/2022   LDLCALC 82 08/29/2022   ALT 10 08/28/2022   AST 30 08/28/2022   NA 131 (L) 09/17/2022   K 4.4 09/17/2022   CL 99 09/17/2022   CREATININE 0.71 09/17/2022   BUN 13 09/17/2022   CO2 22 09/17/2022   TSH 0.39 07/05/2022  INR 1.0 08/28/2022   HGBA1C 6.6 (H) 07/05/2022     Assessment & Plan:    See Problem List for Assessment and Plan of chronic medical problems.   This encounter was created in error - please disregard.

## 2022-10-27 ENCOUNTER — Encounter: Payer: Medicare HMO | Admitting: Internal Medicine

## 2022-10-27 DIAGNOSIS — I739 Peripheral vascular disease, unspecified: Secondary | ICD-10-CM | POA: Diagnosis not present

## 2022-10-27 DIAGNOSIS — E785 Hyperlipidemia, unspecified: Secondary | ICD-10-CM

## 2022-10-27 DIAGNOSIS — Z7901 Long term (current) use of anticoagulants: Secondary | ICD-10-CM | POA: Diagnosis not present

## 2022-10-27 DIAGNOSIS — G935 Compression of brain: Secondary | ICD-10-CM | POA: Diagnosis not present

## 2022-10-27 DIAGNOSIS — E1159 Type 2 diabetes mellitus with other circulatory complications: Secondary | ICD-10-CM

## 2022-10-27 DIAGNOSIS — R69 Illness, unspecified: Secondary | ICD-10-CM | POA: Diagnosis not present

## 2022-10-27 DIAGNOSIS — I69354 Hemiplegia and hemiparesis following cerebral infarction affecting left non-dominant side: Secondary | ICD-10-CM | POA: Diagnosis not present

## 2022-10-27 DIAGNOSIS — I2510 Atherosclerotic heart disease of native coronary artery without angina pectoris: Secondary | ICD-10-CM | POA: Diagnosis not present

## 2022-10-27 DIAGNOSIS — Z950 Presence of cardiac pacemaker: Secondary | ICD-10-CM | POA: Diagnosis not present

## 2022-10-27 DIAGNOSIS — Z89611 Acquired absence of right leg above knee: Secondary | ICD-10-CM | POA: Diagnosis not present

## 2022-10-27 DIAGNOSIS — Z4781 Encounter for orthopedic aftercare following surgical amputation: Secondary | ICD-10-CM | POA: Diagnosis not present

## 2022-10-27 DIAGNOSIS — I10 Essential (primary) hypertension: Secondary | ICD-10-CM

## 2022-10-27 DIAGNOSIS — R1310 Dysphagia, unspecified: Secondary | ICD-10-CM

## 2022-10-27 DIAGNOSIS — I480 Paroxysmal atrial fibrillation: Secondary | ICD-10-CM | POA: Diagnosis not present

## 2022-10-27 DIAGNOSIS — Z8673 Personal history of transient ischemic attack (TIA), and cerebral infarction without residual deficits: Secondary | ICD-10-CM

## 2022-10-27 DIAGNOSIS — Z89612 Acquired absence of left leg above knee: Secondary | ICD-10-CM | POA: Diagnosis not present

## 2022-10-27 NOTE — Assessment & Plan Note (Signed)
Chronic Continue crestor 40 mg daily

## 2022-10-27 NOTE — Assessment & Plan Note (Signed)
Chronic Following with cardio On eliquis 5 mg bid

## 2022-10-27 NOTE — Assessment & Plan Note (Signed)
Chronic. 

## 2022-10-27 NOTE — Assessment & Plan Note (Signed)
Chronic   Lab Results  Component Value Date   HGBA1C 6.6 (H) 07/05/2022   Sugars  controlled Check A1c Diet controlled Stressed diabetic diet

## 2022-10-27 NOTE — Assessment & Plan Note (Signed)
Chronic BP well controlled Continue amlodipine 10 mg daily,  cmp

## 2022-10-29 DIAGNOSIS — M0609 Rheumatoid arthritis without rheumatoid factor, multiple sites: Secondary | ICD-10-CM | POA: Diagnosis not present

## 2022-10-29 DIAGNOSIS — I2510 Atherosclerotic heart disease of native coronary artery without angina pectoris: Secondary | ICD-10-CM | POA: Diagnosis not present

## 2022-10-29 DIAGNOSIS — I739 Peripheral vascular disease, unspecified: Secondary | ICD-10-CM | POA: Diagnosis not present

## 2022-10-29 DIAGNOSIS — I63231 Cerebral infarction due to unspecified occlusion or stenosis of right carotid arteries: Secondary | ICD-10-CM | POA: Diagnosis not present

## 2022-11-01 ENCOUNTER — Telehealth: Payer: Self-pay | Admitting: Internal Medicine

## 2022-11-01 DIAGNOSIS — Z89612 Acquired absence of left leg above knee: Secondary | ICD-10-CM | POA: Diagnosis not present

## 2022-11-01 DIAGNOSIS — K219 Gastro-esophageal reflux disease without esophagitis: Secondary | ICD-10-CM | POA: Diagnosis not present

## 2022-11-01 DIAGNOSIS — Z89611 Acquired absence of right leg above knee: Secondary | ICD-10-CM | POA: Diagnosis not present

## 2022-11-01 DIAGNOSIS — D649 Anemia, unspecified: Secondary | ICD-10-CM | POA: Diagnosis not present

## 2022-11-01 DIAGNOSIS — M7989 Other specified soft tissue disorders: Secondary | ICD-10-CM | POA: Diagnosis not present

## 2022-11-01 DIAGNOSIS — R1310 Dysphagia, unspecified: Secondary | ICD-10-CM | POA: Diagnosis not present

## 2022-11-01 DIAGNOSIS — I69391 Dysphagia following cerebral infarction: Secondary | ICD-10-CM | POA: Diagnosis not present

## 2022-11-01 DIAGNOSIS — Z87891 Personal history of nicotine dependence: Secondary | ICD-10-CM | POA: Diagnosis not present

## 2022-11-01 DIAGNOSIS — F325 Major depressive disorder, single episode, in full remission: Secondary | ICD-10-CM | POA: Diagnosis not present

## 2022-11-01 DIAGNOSIS — F419 Anxiety disorder, unspecified: Secondary | ICD-10-CM | POA: Diagnosis not present

## 2022-11-01 DIAGNOSIS — G629 Polyneuropathy, unspecified: Secondary | ICD-10-CM | POA: Diagnosis not present

## 2022-11-01 DIAGNOSIS — I495 Sick sinus syndrome: Secondary | ICD-10-CM | POA: Diagnosis not present

## 2022-11-01 DIAGNOSIS — I480 Paroxysmal atrial fibrillation: Secondary | ICD-10-CM | POA: Diagnosis not present

## 2022-11-01 DIAGNOSIS — I2510 Atherosclerotic heart disease of native coronary artery without angina pectoris: Secondary | ICD-10-CM | POA: Diagnosis not present

## 2022-11-01 DIAGNOSIS — M069 Rheumatoid arthritis, unspecified: Secondary | ICD-10-CM | POA: Diagnosis not present

## 2022-11-01 DIAGNOSIS — I69354 Hemiplegia and hemiparesis following cerebral infarction affecting left non-dominant side: Secondary | ICD-10-CM | POA: Diagnosis not present

## 2022-11-01 DIAGNOSIS — I252 Old myocardial infarction: Secondary | ICD-10-CM | POA: Diagnosis not present

## 2022-11-01 DIAGNOSIS — M329 Systemic lupus erythematosus, unspecified: Secondary | ICD-10-CM | POA: Diagnosis not present

## 2022-11-01 DIAGNOSIS — I739 Peripheral vascular disease, unspecified: Secondary | ICD-10-CM | POA: Diagnosis not present

## 2022-11-01 DIAGNOSIS — Z7901 Long term (current) use of anticoagulants: Secondary | ICD-10-CM | POA: Diagnosis not present

## 2022-11-01 DIAGNOSIS — M3500 Sicca syndrome, unspecified: Secondary | ICD-10-CM | POA: Diagnosis not present

## 2022-11-01 DIAGNOSIS — Z951 Presence of aortocoronary bypass graft: Secondary | ICD-10-CM | POA: Diagnosis not present

## 2022-11-01 DIAGNOSIS — Z950 Presence of cardiac pacemaker: Secondary | ICD-10-CM | POA: Diagnosis not present

## 2022-11-01 DIAGNOSIS — I10 Essential (primary) hypertension: Secondary | ICD-10-CM | POA: Diagnosis not present

## 2022-11-01 DIAGNOSIS — E785 Hyperlipidemia, unspecified: Secondary | ICD-10-CM | POA: Diagnosis not present

## 2022-11-01 NOTE — Telephone Encounter (Signed)
PT verbal orders 2 week for 6 weeks         1 week for 2 weeks  Skilled nurse evaluation Script or strip for sliding board  Dwyane Luo [phone] its okay to leave VM.

## 2022-11-01 NOTE — Telephone Encounter (Signed)
Message left with verbals today.

## 2022-11-01 NOTE — Telephone Encounter (Signed)
Okay for orders? 

## 2022-11-09 ENCOUNTER — Telehealth: Payer: Self-pay | Admitting: Internal Medicine

## 2022-11-09 NOTE — Telephone Encounter (Signed)
Laka from [place] would like verbal orders for a medical social worker evaluation. She can schedule a Education officer, museum for the patient to help her get different benefits through her insurance.  Laka from [place])

## 2022-11-09 NOTE — Telephone Encounter (Signed)
Spoke with [facility] today.  She is seeing patient on Wednesday and she will call and follow up.

## 2022-11-09 NOTE — Telephone Encounter (Signed)
Okay for orders? 

## 2022-11-10 ENCOUNTER — Telehealth: Payer: Self-pay | Admitting: Internal Medicine

## 2022-11-10 NOTE — Telephone Encounter (Signed)
[place] Name: [facility] Agency Name: Caryl Ada Phone #: [phone])  Service Requested: OT home health ADL transfers to shower and toilet IADL therapeutic exercises and activities (examples: OT/PT/Skilled Nursing/Social Work/Speech Therapy/Wound Care)

## 2022-11-10 NOTE — Telephone Encounter (Signed)
Okay for orders? 

## 2022-11-10 NOTE — Telephone Encounter (Signed)
Verbals given today. °

## 2022-11-15 ENCOUNTER — Inpatient Hospital Stay: Payer: Medicare Other | Admitting: Diagnostic Neuroimaging

## 2022-11-15 ENCOUNTER — Encounter: Payer: Self-pay | Admitting: Diagnostic Neuroimaging

## 2022-11-27 ENCOUNTER — Encounter: Payer: Self-pay | Admitting: Internal Medicine

## 2022-11-27 NOTE — Progress Notes (Signed)
Subjective:    Patient ID: Mercedes [name], female    DOB: 02/06/1956, 67 y.o.   MRN: [id number]     HPI Mercedes [name] is here for follow up from the hospital   Admitted 08/28/22 - 09/18/22 for acute right hemispheric infarct due to acute right ICA terminus occlusion - likely embolic secndary to Afib s/p IR w/ [id number].    Presented with right gaze deviation and left side weakness.  Ct showed right ICA occlusion.  Taken to IR for mechanical thrombectomy.  IR achiieved complete recanalization.  Follow up Ct with right CMA infarct w/ petechial hemorhage, interhemispheric SAH.  Follow up CT w/ decreased interhemispheric SAH, right infarct stable with petechial hemorrhage stable.  Follow up Ct stable w/o hemorrhagic transformation, almost resolve SAH.   Dysphagia - MBS done - dysphagia 2 w honey thick liquids  Discharged with residual dysarthria and improving RUE weakness.    Discharged to SNF     Medications and allergies reviewed with patient and updated if appropriate.  Current Outpatient Medications on File Prior to Visit  Medication Sig Dispense Refill  . acetaminophen (TYLENOL) 500 MG tablet Take by mouth.    [name] amLODipine (NORVASC) 10 MG tablet Take 1 tablet (10 mg total) by mouth daily. 30 tablet 0  . bisacodyl (DULCOLAX) 10 MG suppository Place 1 suppository (10 mg total) rectally daily as needed for moderate constipation. 12 suppository 0  . docusate sodium (COLACE) 100 MG capsule Take 1 capsule (100 mg total) by mouth 2 (two) times daily. 10 capsule 0  . ELIQUIS 5 MG TABS tablet TAKE 1 TABLET BY MOUTH TWICE  DAILY 180 tablet 3  . gabapentin (NEURONTIN) 300 MG capsule Take 1 capsule (300 mg total) by mouth every 8 (eight) hours. 30 capsule 0  . nitroGLYCERIN (NITROSTAT) 0.4 MG SL tablet Place 1 tablet (0.4 mg total) under the tongue every 5 (five) minutes as needed for chest pain. 25 tablet 11  . polyethylene glycol (MIRALAX / GLYCOLAX) 17 g packet Take 17 g by mouth daily. 14 each 0   . rosuvastatin (CRESTOR) 40 MG tablet Take 1 tablet (40 mg total) by mouth daily. 90 tablet 3  . senna-docusate (SENOKOT-S) 8.6-50 MG tablet Take 1 tablet by mouth at bedtime as needed for mild constipation. 14 tablet 0   No current facility-administered medications on file prior to visit.     Review of Systems     Objective:  There were no vitals filed for this visit. BP Readings from Last 3 Encounters:  01/13/23 (!) 177/92  09/18/22 122/84  07/05/22 134/80   Wt Readings from Last 3 Encounters:  01/13/23 89 lb 11.2 oz (40.7 kg)  09/18/22 96 lb 1.9 oz (43.6 kg)  06/29/22 94 lb (42.6 kg)   There is no height or weight on file to calculate BMI.    Physical Exam     Lab Results  Component Value Date   WBC 4.2 01/13/2023   HGB 12.5 01/13/2023   HCT 41.1 01/13/2023   PLT 385 01/13/2023   GLUCOSE 93 01/13/2023   CHOL 144 08/29/2022   TRIG 88 08/29/2022   HDL 44 08/29/2022   LDLCALC 82 08/29/2022   ALT 10 08/28/2022   AST 30 08/28/2022   NA 136 01/13/2023   K 4.2 01/13/2023   CL 106 01/13/2023   CREATININE 1.00 01/13/2023   BUN 23 01/13/2023   CO2 25 01/13/2023   TSH 0.39 07/05/2022   INR 1.0 08/28/2022  HGBA1C 6.6 (H) 07/05/2022     Assessment & Plan:    See Problem List for Assessment and Plan of chronic medical problems.    This encounter was created in error - please disregard.

## 2022-11-27 NOTE — Patient Instructions (Signed)
      Blood work was ordered.   The lab is on the first floor.    Medications changes include :       A referral was ordered for XXX.     Someone will call you to schedule an appointment.    No follow-ups on file.  

## 2022-11-28 ENCOUNTER — Encounter: Payer: Medicare HMO | Admitting: Internal Medicine

## 2022-11-28 DIAGNOSIS — E785 Hyperlipidemia, unspecified: Secondary | ICD-10-CM

## 2022-11-28 DIAGNOSIS — E1159 Type 2 diabetes mellitus with other circulatory complications: Secondary | ICD-10-CM

## 2022-11-28 DIAGNOSIS — Z8673 Personal history of transient ischemic attack (TIA), and cerebral infarction without residual deficits: Secondary | ICD-10-CM

## 2022-11-28 DIAGNOSIS — I480 Paroxysmal atrial fibrillation: Secondary | ICD-10-CM

## 2022-11-28 DIAGNOSIS — I10 Essential (primary) hypertension: Secondary | ICD-10-CM

## 2022-12-21 NOTE — Progress Notes (Deleted)
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   Admitted 11/17 with NSTEMI (troponin peak of 4.10). Initial cath showed a widely patent LCx stent but there was evidence of focal thrombus at the distal LM and ostial LAD. She was continued on Aggrastat and a relook cath was performed on 11/17 which showed 40-50% distal LMCA haziness and 70-80% ostial LAD stenosis. Had CABG on 07/17/2016 with LIMA-LAD, SVG-OM, and SVG-dRCA). Also with h/o PAF. Patient had thrombectomy left femoral-popliteal bypass patch angioplasty proximal left femoral popliteal and common femoral artery and thrombectomy left external iliac artery, profunda, superficial femoral artery in September 2020.  Patient had left transmetatarsal amputation for digit ischemia November 2020.  Nuclear study January 2022 showed prior infarct but no ischemia.  Carotid Dopplers March 2023 showed 1 to 39% right and 40 to 59% left stenosis.  Had CVA in January 2024 due to acute right internal carotid artery terminus occlusion.  This is felt likely embolic secondary to atrial fibrillation.  She had mechanical thrombectomy at that time.  Echocardiogram January 2024 showed normal LV function, grade 1 diastolic dysfunction, hypokinesis of the apical inferior and septal wall.  There has been question of compliance with medications.  Since last seen   Current Outpatient Medications  Medication Sig Dispense Refill   acetaminophen (TYLENOL) 500 MG tablet Take by mouth.     amLODipine (NORVASC) 10 MG tablet Take 1 tablet (10 mg total) by mouth daily. 30 tablet 0   bisacodyl (DULCOLAX) 10 MG suppository Place 1 suppository (10 mg total) rectally daily as needed for moderate constipation. 12 suppository 0   docusate sodium (COLACE) 100 MG capsule Take 1 capsule (100 mg total) by mouth 2 (two) times daily. 10 capsule 0   ELIQUIS 5 MG TABS tablet TAKE 1 TABLET BY MOUTH  TWICE  DAILY 180 tablet 3   gabapentin (NEURONTIN) 300 MG capsule Take 1 capsule (300 mg total) by mouth every 8 (eight) hours. 30 capsule 0   nitroGLYCERIN (NITROSTAT) 0.4 MG SL tablet Place 1 tablet (0.4 mg total) under the tongue every 5 (five) minutes as needed for chest pain. 25 tablet 11   polyethylene glycol (MIRALAX / GLYCOLAX) 17 g packet Take 17 g by mouth daily. 14 each 0   rosuvastatin (CRESTOR) 40 MG tablet Take 1 tablet (40 mg total) by mouth daily. 90 tablet 3   senna-docusate (SENOKOT-S) 8.6-50 MG tablet Take 1 tablet by mouth at bedtime as needed for mild constipation. 14 tablet 0   No current facility-administered medications for this visit.     Past Medical History:  Diagnosis Date   Abnormal LFTs    a. in the past when on Lipitor   Anemia    as a young woman   Anxiety    Arnold-Chiari malformation, type I    MRI brain 02/2010   CAD (coronary artery disease)    a. NSTEMI 07/2009: LHC - D2 40%, LAD irreg., EF 50% with apical AK (tako-tsubo CM);  b. Inf STEMI (04/2013): Tx Promus DES to CFX;  c. 08/2013 Lexi CL: No ischemia, dist ant/ap/inf-ap infarct, EF  d. 06/2016 NSTEMI with LM disease: s/p CABG on 07/17/2016 w/ LIMA-LAD, SVG-OM, and SVG-dRCA.   DEPRESSION    Dizziness    ? 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 anticoagulated with Eliquis 10/2014   Presence of permanent cardiac pacemaker 10/2014   leadless permanent pacemaker   RA (rheumatoid arthritis)    prior tx by Dr. Dareen Piano   Sjogren's syndrome 06/02/2013   pt denies this (12/01/13)   SLE (systemic lupus erythematosus) dx 05/2013   follows with rheum Dareen Piano)   Stroke    March  2023, weakness in right arm is improving   Tachy-brady syndrome    a. 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   f/u echo 09/2009: EF 50-55%, mild LVH, mod diast dysfxn, mild apical HK   Wears dentures    Wears glasses     Past Surgical History:  Procedure Laterality Date   ABDOMINAL AORTAGRAM N/A 11/24/2013   Procedure: ABDOMINAL AORTAGRAM WITH BILATERAL RUNOFF WITH POSSIBLE INTERVENTION;  Surgeon: Chuck Hint, MD;  Location: [facility] CATH LAB;  Service: Cardiovascular;  Laterality: N/A;   ABDOMINAL AORTOGRAM W/LOWER EXTREMITY N/A 04/25/2019   Procedure: ABDOMINAL AORTOGRAM W/LOWER EXTREMITY;  Surgeon: Sherren Kerns, MD;  Location: [facility] INVASIVE CV LAB;  Service: Cardiovascular;  Laterality: N/A;   ABDOMINAL AORTOGRAM W/LOWER EXTREMITY Bilateral 05/15/2019   Procedure: ABDOMINAL AORTOGRAM W/LOWER EXTREMITY;  Surgeon: Nada Libman, MD;  Location: [facility] INVASIVE CV LAB;  Service: Cardiovascular;  Laterality: Bilateral;   ABDOMINAL AORTOGRAM W/LOWER EXTREMITY N/A 08/23/2020   Procedure: ABDOMINAL AORTOGRAM W/LOWER EXTREMITY;  Surgeon: Maeola Harman, MD;  Location: [facility] INVASIVE CV LAB;  Service: Cardiovascular;  Laterality: N/A;   ABDOMINAL AORTOGRAM W/LOWER EXTREMITY N/A 10/29/2020   Procedure: ABDOMINAL AORTOGRAM W/LOWER EXTREMITY;  Surgeon: Sherren Kerns, MD;  Location: [facility] INVASIVE CV LAB;  Service: Cardiovascular;  Laterality: N/A;   AMPUTATION Left 12/02/2013   Procedure: LEFT 1ST TOE AMPUTATION;  Surgeon: Sherren Kerns, MD;  Location: [facility] OR;  Service: Vascular;  Laterality: Left;   AMPUTATION Left 07/16/2019   Procedure: LEFT TRANSMETATARSAL AMPUTATION;  Surgeon: Nadara Mustard, MD;  Location: [facility] OR;  Service: Orthopedics;  Laterality: Left;   AMPUTATION Left 10/01/2019   Procedure: LEFT AMPUTATION ABOVE KNEE;  Surgeon: Sherren Kerns, MD;  Location: [facility] OR;  Service: Vascular;  Laterality: Left;   AMPUTATION Right 08/31/2020    Procedure: Right first toe amputation;  Surgeon: Sherren Kerns, MD;  Location: [facility] OR;  Service: Vascular;  Laterality: Right;   AMPUTATION Right 11/23/2020   Procedure: RIGHT AMPUTATION ABOVE KNEE;  Surgeon: Sherren Kerns, MD;  Location: [facility] OR;  Service: Vascular;  Laterality: Right;   AMPUTATION Left 03/14/2022   Procedure: REVISION LEFT ABOVE KNEE AMPUTATION;  Surgeon: Maeola Harman, MD;  Location: [facility] OR;  Service: Vascular;  Laterality: Left;   APPLICATION OF WOUND VAC Left 11/03/2019   Procedure: Application Of Wound Vac, left lower extremity;  Surgeon: Sherren Kerns, MD;  Location: [facility] OR;  Service: Vascular;  Laterality: Left;   CARDIAC CATHETERIZATION N/A 07/12/2016   Procedure: Left Heart Cath and Coronary Angiography;  Surgeon: Lennette Bihari, MD;  Location: [facility] INVASIVE CV LAB;  Service: Cardiovascular;  Laterality: N/A;   CARDIAC CATHETERIZATION N/A 07/14/2016   Procedure: Intravascular Ultrasound/IVUS;  Surgeon: Yvonne Kendall, MD;  Location: [facility] INVASIVE CV LAB;  Service:  Cardiovascular;  Laterality: N/A;   COLONOSCOPY     CORONARY ANGIOPLASTY  04/2013   CORONARY ARTERY BYPASS GRAFT N/A 07/17/2016   Procedure: CORONARY ARTERY BYPASS GRAFTING (CABG)x3 using right greater saphenous vein harvested endoscopiclly and left internal mammary artery;  Surgeon: Kerin Perna, MD;  Location: [facility] OR;  Service: Open Heart Surgery;  Laterality: N/A;   ENDARTERECTOMY FEMORAL Left 05/12/2019   Procedure: LEFT Femoral Endarterectomy;  Surgeon: Sherren Kerns, MD;  Location: [facility] OR;  Service: Vascular;  Laterality: Left;   FEMORAL-POPLITEAL BYPASS GRAFT Left 12/02/2013   Procedure:  LEFT FEMORAL-BELOW KNEE POPLITEAL ARTERY BYPASS GRAFT;  Surgeon: Sherren Kerns, MD;  Location: [facility] OR;  Service: Vascular;  Laterality: Left;   FEMORAL-POPLITEAL BYPASS GRAFT Left 09/25/2019   Procedure: THROMBECTOMY and PROPATEN BYPASS  FEMORAL TO BELOW KNEE POPLITEAL ARTERY BYPASS GRAFT LEFT LEG;   Surgeon: Larina Earthly, MD;  Location: [facility] OR;  Service: Vascular;  Laterality: Left;   FEMORAL-POPLITEAL BYPASS GRAFT Right 08/24/2020   Procedure: RIGHT FEMORAL-BELOW KNEE POPLITEAL ARTERY BYPASS USING GORE PROPATEN GRAFT;  Surgeon: Maeola Harman, MD;  Location: [facility] OR;  Service: Vascular;  Laterality: Right;   FEMORAL-POPLITEAL BYPASS GRAFT Right 11/01/2020   Procedure: Thrombectomy and Revision of Right Femoral to below knee Bypass with Interpostional graft to Tibial / peroneal Trunk and Endarterectomy Tibial Weston Settle Trunk.;  Surgeon: Sherren Kerns, MD;  Location: [facility] OR;  Service: Vascular;  Laterality: Right;   INTRAOPERATIVE ARTERIOGRAM Right 11/01/2020   Procedure: ANTEGRADE RIGHT LOWER EXTREMITY INTRA OPERATIVE ARTERIOGRAM;  Surgeon: Sherren Kerns, MD;  Location: [facility] OR;  Service: Vascular;  Laterality: Right;   IR CT HEAD LTD  08/28/2022   IR PERCUTANEOUS ART THROMBECTOMY/INFUSION INTRACRANIAL INC DIAG ANGIO  08/28/2022   IR US GUIDE VASC ACCESS RIGHT  08/28/2022   LEFT HEART CATHETERIZATION WITH CORONARY ANGIOGRAM N/A 05/19/2013   Procedure: LEFT HEART CATHETERIZATION WITH CORONARY ANGIOGRAM;  Surgeon: Micheline Chapman, MD;  Location: [facility] CATH LAB;  Service: Cardiovascular;  Laterality: N/A;   LEFT HEART CATHETERIZATION WITH CORONARY ANGIOGRAM N/A 10/28/2014   Procedure: LEFT HEART CATHETERIZATION WITH CORONARY ANGIOGRAM;  Surgeon: Iran Ouch, MD;  Location: [facility] CATH LAB;  Service: Cardiovascular;  Laterality: N/A;   LOWER EXTREMITY ANGIOGRAM Left 05/12/2019   Procedure: Lower Extremity Angiogram;  Surgeon: Sherren Kerns, MD;  Location: [facility] OR;  Service: Vascular;  Laterality: Left;   PATCH ANGIOPLASTY Left 05/12/2019   Procedure: Left Femoral Patch Angioplasty USING CADAVERIC SAPHENOUS VEIN;  Surgeon: Sherren Kerns, MD;  Location: [facility] OR;  Service: Vascular;  Laterality: Left;   PERCUTANEOUS CORONARY STENT INTERVENTION (PCI-S)  05/19/2013   Procedure: PERCUTANEOUS CORONARY  STENT INTERVENTION (PCI-S);  Surgeon: Micheline Chapman, MD;  Location: [facility] CATH LAB;  Service: Cardiovascular;;   PERIPHERAL VASCULAR INTERVENTION Right 10/29/2020   Procedure: PERIPHERAL VASCULAR INTERVENTION;  Surgeon: Sherren Kerns, MD;  Location: [facility] INVASIVE CV LAB;  Service: Cardiovascular;  Laterality: Right;  common iliac   PERMANENT PACEMAKER INSERTION N/A 11/04/2014   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   RADIOLOGY WITH ANESTHESIA N/A 08/28/2022   Procedure: RADIOLOGY WITH ANESTHESIA;  Surgeon: Radiologist, Medication, MD;  Location: [facility] OR;  Service: Radiology;  Laterality: N/A;   TEE WITHOUT CARDIOVERSION N/A 07/17/2016   Procedure: TRANSESOPHAGEAL ECHOCARDIOGRAM (TEE);  Surgeon: Kerin Perna, MD;  Location: [facility] OR;  Service: Open Heart Surgery;  Laterality: N/A;   THROMBECTOMY FEMORAL ARTERY Left 05/12/2019   Procedure: Left  Ilio-Femoral Artery and Femoral-popliteal Graft Thrombectomy;  Surgeon: Sherren Kerns, MD;  Location: [facility] OR;  Service: Vascular;  Laterality: Left;   THROMBECTOMY OF BYPASS GRAFT FEMORAL- POPLITEAL ARTERY Right 11/01/2020   Procedure: POSSIBLE THROMBOLYSIS VERSUS OPEN THROMBECTOMY AND REVSION OF RIGHT FEMORAL-POPLITEAL ARTERY BYPASS;  Surgeon: Sherren Kerns, MD;  Location: [facility] OR;  Service: Vascular;  Laterality: Right;   TUBAL LIGATION     VISCERAL ANGIOGRAPHY N/A 04/25/2019   Procedure: MESENTRIC  ANGIOGRAPHY;  Surgeon: Sherren Kerns, MD;  Location: [facility] INVASIVE CV LAB;  Service: Cardiovascular;  Laterality: N/A;   WOUND DEBRIDEMENT Left 11/03/2019   Procedure: Debridement Wound of left above the knee amputation;  Surgeon: Sherren Kerns, MD;  Location: [facility] OR;  Service: Vascular;  Laterality: Left;    Social History   Socioeconomic History   Marital status: Single    Spouse name: Not on file   Number of children: 3   Years of education: Not on file   Highest education level: Not on file  Occupational History   Occupation:  retired  Tobacco Use   Smoking status: Former    Types: Cigarettes    Quit date: 2019    Years since quitting: 5.3   Smokeless tobacco: Never   Tobacco comments:       Vaping Use   Vaping Use: Never used  Substance and Sexual Activity   Alcohol use: No    Alcohol/week: 0.0 standard drinks of alcohol   Drug use: Not Currently    Types: Marijuana, Other-see comments    Comment:  and CBD oil  last use of both was 10/29/19   Sexual activity: Not on file  Other Topics Concern   Not on file  Social History Narrative   Lives alone.   Social Determinants of Health   Financial Resource Strain: Low Risk  (07/15/2021)   Overall Financial Resource Strain (CARDIA)    Difficulty of Paying Living Expenses: Not hard at all  Food Insecurity: No Food Insecurity (07/15/2021)   Hunger Vital Sign    Worried About Running Out of Food in the Last Year: Never true    Ran Out of Food in the Last Year: Never true  Transportation Needs: No Transportation Needs (07/15/2021)   PRAPARE - Administrator, Civil Service (Medical): No    Lack of Transportation (Non-Medical): No  Physical Activity: Inactive (07/15/2021)   Exercise Vital Sign    Days of Exercise per Week: 0 days    Minutes of Exercise per Session: 0 min  Stress: No Stress Concern Present (07/15/2021)   [employer] of Occupational Health - Occupational Stress Questionnaire    Feeling of Stress : Not at all  Social Connections: Unknown (07/15/2021)   Social Connection and Isolation Panel [NHANES]    Frequency of Communication with Friends and Family: Twice a week    Frequency of Social Gatherings with Friends and Family: Twice a week    Attends Religious Services: Never    Database administrator or Organizations: No    Attends Banker Meetings: Never    Marital Status: Patient declined  Catering manager Violence: Not At Risk (07/15/2021)   Humiliation, Afraid, Rape, and Kick questionnaire    Fear of Current  or Ex-Partner: No    Emotionally Abused: No    Physically Abused: No    Sexually Abused: No    Family History  Problem Relation Age of Onset   Arthritis Mother  Emphysema Mother    Arthritis Father    COPD Father    Heart disease Father    Diabetes Paternal Aunt        paternal Aunts   Heart disease Paternal Aunt    Thyroid disease Neg Hx     ROS: no fevers or chills, productive cough, hemoptysis, dysphasia, odynophagia, melena, hematochezia, dysuria, hematuria, rash, seizure activity, orthopnea, PND, pedal edema, claudication. Remaining systems are negative.  Physical Exam: Well-developed well-nourished in no acute distress.  Skin is warm and dry.  HEENT is normal.  Neck is supple.  Chest is clear to auscultation with normal expansion.  Cardiovascular exam is regular rate and rhythm.  Abdominal exam nontender or distended. No masses palpated. Extremities show no edema. neuro grossly intact  ECG- personally reviewed  A/P  1 coronary artery disease status post coronary bypass and graft-most recent nuclear study showed no ischemia.  She denies chest pain.  Continue statin.  No aspirin given need for apixaban.  2 paroxysmal atrial fibrillation-continue beta-blocker and apixaban.  Had recent CVA felt to be embolic from atrial fibrillation.  She had not been taking her apixaban at that time.  3 carotid artery disease-we will arrange follow-up carotid Dopplers.  4 peripheral vascular disease-Per vascular surgery.  5 hypertension-blood pressure controlled.  Continue present medications.  6 hyperlipidemia-continue statin.  7 history of pacemaker-will arrange follow-up with electrophysiology.  8 tobacco abuse-patient previously had leadless pacemaker which is not functioning.  New pacemaker was offered previously but she declined.  She also declines removal of the leadless pacemaker.  Olga Millers, MD

## 2022-12-28 ENCOUNTER — Other Ambulatory Visit: Payer: Self-pay

## 2022-12-28 ENCOUNTER — Ambulatory Visit: Payer: Medicare HMO | Attending: Cardiology | Admitting: Cardiology

## 2022-12-28 NOTE — Patient Outreach (Signed)
Telephone outreach to patient to obtain mRS was successfully completed. MRS=2  Mercedes [name] [facility] Care Management Assistant [phone]  

## 2023-01-02 ENCOUNTER — Encounter: Payer: Self-pay | Admitting: Internal Medicine

## 2023-01-02 NOTE — Progress Notes (Deleted)
Subjective:    Patient ID: Mercedes [name], female    DOB: [birth year], 67 y.o.   MRN: [id number]      HPI Mercedes [name] is here for a Physical exam and her chronic medical problems.   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  Discharged with residual dysarthria and improving RUE weakness.    Discharged to SNF     Medications and allergies reviewed with patient and updated if appropriate.  Current Outpatient Medications on File Prior to Visit  Medication Sig Dispense Refill   acetaminophen (TYLENOL) 500 MG tablet Take by mouth.     amLODipine (NORVASC) 10 MG tablet Take 1 tablet (10 mg total) by mouth daily. 30 tablet 0   bisacodyl (DULCOLAX) 10 MG suppository Place 1 suppository (10 mg total) rectally daily as needed for moderate constipation. 12 suppository 0   docusate sodium (COLACE) 100 MG capsule Take 1 capsule (100 mg total) by mouth 2 (two) times daily. 10 capsule 0   ELIQUIS 5 MG TABS tablet TAKE 1 TABLET BY MOUTH TWICE  DAILY 180 tablet 3   gabapentin (NEURONTIN) 300 MG capsule Take 1 capsule (300 mg total) by mouth every 8 (eight) hours. 30 capsule 0   nitroGLYCERIN (NITROSTAT) 0.4 MG SL tablet Place 1 tablet (0.4 mg total) under the tongue every 5 (five) minutes as needed for chest pain. 25 tablet 11   polyethylene glycol (MIRALAX / GLYCOLAX) 17 g packet Take 17 g by mouth daily. 14 each 0   rosuvastatin (CRESTOR) 40 MG tablet Take 1 tablet (40 mg total) by mouth daily. 90 tablet 3   senna-docusate (SENOKOT-S) 8.6-50 MG tablet Take 1 tablet by mouth at bedtime as needed for mild constipation. 14 tablet 0   No current facility-administered medications on file prior to visit.    Review of Systems     Objective:  There were no vitals filed for this visit. There were no vitals filed for this visit. There is no height or weight on file to calculate BMI.  BP  Readings from Last 3 Encounters:  09/18/22 122/84  07/05/22 134/80  06/29/22 (!) 166/80    Wt Readings from Last 3 Encounters:  09/18/22 96 lb 1.9 oz (43.6 kg)  06/29/22 94 lb (42.6 kg)  04/12/22 94 lb (42.6 kg)       Physical Exam Constitutional: She appears well-developed and well-nourished. No distress.  HENT:  Head: Normocephalic and atraumatic.  Right Ear: External ear normal. Normal ear canal and TM Left Ear: External ear normal.  Normal ear canal and TM Mouth/Throat: Oropharynx is clear and moist.  Eyes: Conjunctivae normal.  Neck: Neck supple. No tracheal deviation present. No thyromegaly present.  No carotid bruit  Cardiovascular: Normal rate, regular rhythm and normal heart sounds.   No murmur heard.  No edema. Pulmonary/Chest: Effort normal and breath sounds normal. No respiratory distress. She has no wheezes. She has no rales.  Breast: deferred   Abdominal: Soft. She exhibits no distension. There is no tenderness.  Lymphadenopathy: She has no cervical adenopathy.  Skin: Skin is warm and dry. She is not diaphoretic.  Psychiatric: She has a normal mood and affect. Her behavior is normal.     Lab Results  Component Value Date   WBC 10.2 09/17/2022   HGB 9.9 (L) 09/17/2022   HCT 32.3 (L) 09/17/2022   PLT 577 (H) 09/17/2022  GLUCOSE 112 (H) 09/17/2022   CHOL 144 08/29/2022   TRIG 88 08/29/2022   HDL 44 08/29/2022   LDLCALC 82 08/29/2022   ALT 10 08/28/2022   AST 30 08/28/2022   NA 131 (L) 09/17/2022   K 4.4 09/17/2022   CL 99 09/17/2022   CREATININE 0.71 09/17/2022   BUN 13 09/17/2022   CO2 22 09/17/2022   TSH 0.39 07/05/2022   INR 1.0 08/28/2022   HGBA1C 6.6 (H) 07/05/2022         Assessment & Plan:   Physical exam: Screening blood work  ordered Exercise   Weight   Substance abuse  none   Reviewed recommended immunizations.   Health Maintenance  Topic Date Due   COVID-19 Vaccine (1) Never done   FOOT EXAM  Never done   Diabetic  kidney evaluation - Urine ACR  Never done   Zoster Vaccines- Shingrix (1 of 2) Never done   COLONOSCOPY (Pts 45-16yrs Insurance coverage will need to be confirmed)  Never done   MAMMOGRAM  Never done   OPHTHALMOLOGY EXAM  07/28/2012   Pneumonia Vaccine 40+ Years old (1 of 1 - PCV) Never done   DEXA SCAN  Never done   Medicare Annual Wellness (AWV)  07/15/2022   HEMOGLOBIN A1C  01/03/2023   DTaP/Tdap/Td (3 - Td or Tdap) 08/29/2023   Diabetic kidney evaluation - eGFR measurement  09/18/2023   Hepatitis C Screening  Completed   HPV VACCINES  Aged Out   INFLUENZA VACCINE  Discontinued          See Problem List for Assessment and Plan of chronic medical problems.

## 2023-01-03 ENCOUNTER — Encounter: Payer: Medicare Other | Admitting: Internal Medicine

## 2023-01-03 DIAGNOSIS — I2510 Atherosclerotic heart disease of native coronary artery without angina pectoris: Secondary | ICD-10-CM

## 2023-01-03 DIAGNOSIS — I480 Paroxysmal atrial fibrillation: Secondary | ICD-10-CM

## 2023-01-03 DIAGNOSIS — E1159 Type 2 diabetes mellitus with other circulatory complications: Secondary | ICD-10-CM

## 2023-01-03 DIAGNOSIS — I69391 Dysphagia following cerebral infarction: Secondary | ICD-10-CM

## 2023-01-03 DIAGNOSIS — I10 Essential (primary) hypertension: Secondary | ICD-10-CM

## 2023-01-13 ENCOUNTER — Emergency Department (HOSPITAL_BASED_OUTPATIENT_CLINIC_OR_DEPARTMENT_OTHER): Payer: 59

## 2023-01-13 ENCOUNTER — Encounter (HOSPITAL_BASED_OUTPATIENT_CLINIC_OR_DEPARTMENT_OTHER): Payer: Self-pay

## 2023-01-13 ENCOUNTER — Emergency Department (HOSPITAL_BASED_OUTPATIENT_CLINIC_OR_DEPARTMENT_OTHER)
Admission: EM | Admit: 2023-01-13 | Discharge: 2023-01-13 | Disposition: A | Discharge status: Home / Self Care | Payer: 59 | Attending: Emergency Medicine | Admitting: Emergency Medicine

## 2023-01-13 DIAGNOSIS — M79661 Pain in right lower leg: Secondary | ICD-10-CM | POA: Insufficient documentation

## 2023-01-13 DIAGNOSIS — M545 Low back pain, unspecified: Secondary | ICD-10-CM | POA: Insufficient documentation

## 2023-01-13 DIAGNOSIS — Z91040 Latex allergy status: Secondary | ICD-10-CM | POA: Insufficient documentation

## 2023-01-13 DIAGNOSIS — M79604 Pain in right leg: Secondary | ICD-10-CM

## 2023-01-13 DIAGNOSIS — M79662 Pain in left lower leg: Secondary | ICD-10-CM | POA: Diagnosis not present

## 2023-01-13 DIAGNOSIS — Z7901 Long term (current) use of anticoagulants: Secondary | ICD-10-CM | POA: Insufficient documentation

## 2023-01-13 LAB — BASIC METABOLIC PANEL
Anion gap: 5 (ref 5–15)
BUN: 23 mg/dL (ref 8–23)
CO2: 25 mmol/L (ref 22–32)
Calcium: 10.1 mg/dL (ref 8.9–10.3)
Chloride: 106 mmol/L (ref 98–111)
Creatinine, Ser: 1 mg/dL (ref 0.44–1.00)
GFR, Estimated: >60 mL/min (ref >60)
Glucose, Bld: 93 mg/dL (ref 70–99)
Potassium: 4.2 mmol/L (ref 3.5–5.1)
Sodium: 136 mmol/L (ref 135–145)

## 2023-01-13 LAB — CBC
HCT: 41.1 % (ref 36.0–46.0)
Hemoglobin: 12.5 g/dL (ref 12.0–15.0)
MCH: 22.9 pg — ABNORMAL LOW (ref 26.0–34.0)
MCHC: 30.4 g/dL (ref 30.0–36.0)
MCV: 75.1 fL — ABNORMAL LOW (ref 80.0–100.0)
Platelets: 385 10*3/uL (ref 150–400)
RBC: 5.47 MIL/uL — ABNORMAL HIGH (ref 3.87–5.11)
RDW: 19.1 % — ABNORMAL HIGH (ref 11.5–15.5)
WBC: 4.2 10*3/uL (ref 4.0–10.5)
nRBC: 0 % (ref 0.0–0.2)

## 2023-01-13 MED ORDER — OXYCODONE HCL 5 MG PO TABS
5.0000 mg | ORAL_TABLET | Freq: Once | ORAL | Status: AC
  Administered 2023-01-13: 5 mg via ORAL
  Filled 2023-01-13: qty 1

## 2023-01-13 MED ORDER — OXYCODONE HCL 5 MG PO TABS: 5.0000 mg | ORAL_TABLET | Freq: Four times a day (QID) | ORAL | 0 refills | Status: DC | PRN

## 2023-01-13 MED ORDER — IOHEXOL 350 MG/ML SOLN
100.0000 mL | Freq: Once | INTRAVENOUS | Status: AC | PRN
  Administered 2023-01-13: 100 mL via INTRAVENOUS

## 2023-01-13 MED ORDER — MORPHINE SULFATE (PF) 4 MG/ML IV SOLN
4.0000 mg | Freq: Once | INTRAVENOUS | Status: AC
  Administered 2023-01-13: 4 mg via INTRAVENOUS
  Filled 2023-01-13: qty 1

## 2023-01-13 MED ORDER — MORPHINE SULFATE (PF) 4 MG/ML IV SOLN: 4.0000 mg | Freq: Once | INTRAVENOUS | Status: DC

## 2023-01-13 MED ORDER — SENNOSIDES-DOCUSATE SODIUM 8.6-50 MG PO TABS: 1.0000 | ORAL_TABLET | Freq: Every day | ORAL | 0 refills | Status: AC | PRN

## 2023-01-13 NOTE — Discharge Instructions (Signed)
You were seen in the ER for pain in your legs today.  You had an ultrasound to look for blood clots in your veins as well as a CT scan to look for new blockages in your arteries.  We do not see any new findings, but you have severe, pre-existing blockages in blood flow problems in your legs.  These types of blockages can cause blood flow issues and chronic pain in the legs.  It is also possible that you are experiencing nerve pain from your lower back, called sciatica or radiculopathy.  We offered the option of hospitalization for pain control versus pain medicine at home.  He preferred to go home.  If your pain is getting worse, or you begin having fevers, redness, pus draining from your leg, or the skin on your stump is turning dark blue or black, please return to the emergency department.

## 2023-01-13 NOTE — ED Provider Notes (Signed)
EMERGENCY DEPARTMENT AT [facility] Provider Note   CSN: [id number] Arrival date & time: 01/13/23  1638     History  Chief Complaint  Patient presents with   Leg Pain    Mercedes [name] is a 67 y.o. female presented to the ED complaining of bilateral leg pain, but left greater than right.  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  She is on Eliquis and compliant.  She reports abrupt onset of pain in her left leg yesterday around 4 PM.  It has been persistent all night.  It is severe pain.  She typically takes Tylenol for pain but it is not helping.  Patient later clarified that she does have chronic pain in her lower extremities and takes gabapentin twice daily for reported sciatica type pain.  She says she has constant pain in her lower back that radiates into her legs.  Pain is worse with sitting upright.  Patient follows with vascular surgery at our hospital and has a history of a left iliac occlusion     HPI     Home Medications Prior to Admission medications   Medication Sig Start Date End Date Taking? Authorizing Provider  oxyCODONE (ROXICODONE) 5 MG immediate release tablet Take 1 tablet (5 mg total) by mouth every 6 (six) hours as needed for up to 20 doses for severe pain. 01/13/23  Yes Terald Sleeper, MD  senna-docusate (SENOKOT-S) 8.6-50 MG tablet Take 1 tablet by mouth daily as needed for mild constipation. For constipation prevention while taking opioid narcotics (oxycodone) 01/13/23 02/12/23 Yes Abryana Lykens, Kermit Balo, MD  acetaminophen (TYLENOL) 500 MG tablet Take by mouth.    [provider]  amLODipine (NORVASC) 10 MG tablet Take 1 tablet (10 mg total) by mouth daily. 09/19/22   Lynnae January, NP  bisacodyl (DULCOLAX) 10 MG suppository Place 1 suppository (10 mg total) rectally daily as needed for moderate  constipation. 09/18/22   Lynnae January, NP  docusate sodium (COLACE) 100 MG capsule Take 1 capsule (100 mg total) by mouth 2 (two) times daily. 09/18/22   Lynnae January, NP  ELIQUIS 5 MG TABS tablet TAKE 1 TABLET BY MOUTH TWICE  DAILY 04/20/22   Pincus Sanes, MD  gabapentin (NEURONTIN) 300 MG capsule Take 1 capsule (300 mg total) by mouth every 8 (eight) hours. 09/18/22   Lynnae January, NP  nitroGLYCERIN (NITROSTAT) 0.4 MG SL tablet Place 1 tablet (0.4 mg total) under the tongue every 5 (five) minutes as needed for chest pain. 12/29/21   Pincus Sanes, MD  polyethylene glycol (MIRALAX / GLYCOLAX) 17 g packet Take 17 g by mouth daily. 09/19/22   Lynnae January, NP  rosuvastatin (CRESTOR) 40 MG tablet Take 1 tablet (40 mg total) by mouth daily. 06/29/22   Marjie Skiff E, PA-C  senna-docusate (SENOKOT-S) 8.6-50 MG tablet Take 1 tablet by mouth at bedtime as needed for mild constipation. 09/18/22   Lynnae January, NP      Allergies    Brilinta [ticagrelor], Latex, and Tape    Review of Systems   Review of Systems  Physical Exam Updated Vital Signs BP (!) 177/92 (BP Location: Right Arm)   Pulse 61   Temp 97.8 F (36.6 C) (Oral)   Resp 18   Wt 40.7 kg   SpO2 100%   BMI 14.93 kg/m  Physical Exam Constitutional:      General: She is not in acute distress.    Comments: Thin, frail  HENT:     Head: Normocephalic and atraumatic.  Eyes:     Conjunctiva/sclera: Conjunctivae normal.     Pupils: Pupils are equal, round, and reactive to light.  Cardiovascular:     Rate and Rhythm: Normal rate and regular rhythm.  Pulmonary:     Effort: Pulmonary effort is normal. No respiratory distress.  Abdominal:     General: There is no distension.     Tenderness: There is no abdominal tenderness.  Musculoskeletal:     Comments: Bilateral above-the-knee amputations.  Leg stump sites bilaterally feel warm, well-perfused, without erythema or drainage  Skin:    General: Skin is warm and dry.   Neurological:     General: No focal deficit present.     Mental Status: She is alert. Mental status is at baseline.  Psychiatric:        Mood and Affect: Mood normal.        Behavior: Behavior normal.     ED Results / Procedures / Treatments   Labs (all labs ordered are listed, but only abnormal results are displayed) Labs Reviewed  CBC - Abnormal; Notable for the following components:      Result Value   RBC 5.47 (*)    MCV 75.1 (*)    MCH 22.9 (*)    RDW 19.1 (*)    All other components within normal limits  BASIC METABOLIC PANEL    EKG None  Radiology CT Angio Aortobifemoral W and/or Wo Contrast  Addendum Date: 01/13/2023   ADDENDUM REPORT: 01/13/2023 19:14 ADDENDUM: These results were discussed by telephone on 01/13/2023 at 7:00 pm with provider Katniss Weedman. Electronically Signed   By: Agustin Cree M.D.   On: 01/13/2023 19:14   Result Date: 01/13/2023 CLINICAL DATA:  History of bilateral above knee amputations with acute onset left posterior leg pain for evaluation of arterial occlusion EXAM: CT ANGIOGRAPHY OF ABDOMINAL AORTA WITH ILIOFEMORAL RUNOFF TECHNIQUE: Multidetector CT imaging of the abdomen, pelvis and lower extremities was performed using the standard protocol during bolus administration of intravenous contrast. Multiplanar CT image reconstructions and MIPs were obtained to evaluate the vascular anatomy. RADIATION DOSE REDUCTION: This exam was performed according to the departmental dose-optimization program which includes automated exposure control, adjustment of the mA and/or kV according to patient size and/or use of iterative reconstruction technique. 100 mL Omnipaque 350 CONTRAST:  OMNIPAQUE IOHEXOL 350 MG/ML SOLN COMPARISON:  CT left femur dated 11/20/2019, CT abdomen and pelvis dated 01/28/2019 FINDINGS: VASCULAR Aorta: Aortic atherosclerosis. Segmental moderate stenosis of the infrarenal abdominal aorta, increased from 01/28/2019. Celiac: Patent without  evidence of aneurysm, dissection, vasculitis or significant stenosis. SMA: Patent without evidence of aneurysm, dissection, vasculitis or significant stenosis. Renals: Single right and 2 left renal arteries. Bilateral renal arteries are diminutive but patent without evidence of aneurysm, dissection, vasculitis, fibromuscular dysplasia. IMA: Not opacified. RIGHT Lower Extremity Inflow: Status post placement of right common iliac artery stent, which appears patent. External iliac artery is patent. Complete occlusion of the internal iliac artery with segmental circumferential calcifications. Outflow: Occlusion of the common femoral artery and femoropopliteal bypass graft. 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 Outflow: Occlusion of the common femoral artery and femoropopliteal bypass graft. Veins: No obvious venous abnormality within the limitations of this arterial phase study. Review of the  MIP images confirms the above findings. NON-VASCULAR Lower chest: Centrilobular emphysema. No focal consolidation or pulmonary nodule in the lung bases. No pleural effusion or pneumothorax demonstrated. Partially imaged heart size is normal. Leadless pacemaker in the right ventricle. Anatomic variant common origin of the brachiocephalic and common carotid arteries. No central pulmonary emboli. Hepatobiliary: No focal hepatic lesions. No intrahepatic bile duct dilation. Common bile duct measures 13 mm, unchanged. Normal gallbladder. Pancreas: No focal lesions or main ductal dilation. Spleen: Normal in size without focal abnormality. Adrenals/Urinary Tract: No adrenal nodules. Bilateral renal cortical scarring. Slight asymmetric hypoenhancement of the right kidney. No hydronephrosis or stones. No focal bladder wall thickening. Stomach/Bowel: Normal appearance of the stomach. No evidence of bowel wall thickening, distention, or inflammatory changes.  Normal appendix. Lymphatic: No enlarged abdominal or pelvic lymph nodes. Reproductive: No adnexal masses. Other: No free fluid, fluid collection, or free air. Musculoskeletal: Status post bilateral above knee amputation. Postsurgical changes of bilateral inguinal regions. No acute or abnormal lytic or blastic osseous lesions. Median sternotomy wires are nondisplaced. IMPRESSION: 1. Extensive aortic atherosclerosis with segmental moderate stenosis of the infrarenal abdominal aorta, increased from 01/28/2019. 2. 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 3. Status post placement of right common iliac artery stent, which appears patent. 4. Occlusion of the bilateral common femoral arteries and femoropopliteal bypass grafts. 5. No acute findings in the abdomen or pelvis. 6. Aortic Atherosclerosis (ICD10-I70.0) and Emphysema (ICD10-J43.9). Electronically Signed: By: Agustin Cree M.D. On: 01/13/2023 18:53   US Venous Img Lower Bilateral  Result Date: 01/13/2023 CLINICAL DATA:  67 year old female with LOWER extremity pain. History of bilateral above the knee amputations. EXAM: BILATERAL LOWER EXTREMITY VENOUS DOPPLER ULTRASOUND TECHNIQUE: Gray-scale sonography with compression, as well as color and duplex ultrasound, were performed to evaluate the deep venous system(s) from the level of the common femoral vein through the popliteal and proximal calf veins. COMPARISON:  None Available. FINDINGS: VENOUS There is no evidence of DVT within the common femoral or profunda femoral veins bilaterally. Slightly limited evaluation of bilateral femoral veins, but no evidence of DVT. Popliteal and calf veins not evaluated due to above the knee amputations. OTHER None. IMPRESSION: 1. No evidence of LOWER extremity DVT, but slightly limited evaluation of the femoral veins. Popliteal and calf veins not evaluated due to above the knee amputations. Electronically Signed   By: Harmon Pier  M.D.   On: 01/13/2023 19:11    Procedures Procedures    Medications Ordered in ED Medications  oxyCODONE (Oxy IR/ROXICODONE) immediate release tablet 5 mg (5 mg Oral Given 01/13/23 1725)  iohexol (OMNIPAQUE) 350 MG/ML injection 100 mL (100 mLs Intravenous Contrast Given 01/13/23 1811)  morphine (PF) 4 MG/ML injection 4 mg (4 mg Intravenous Given 01/13/23 1933)    ED Course/ Medical Decision Making/ A&P Clinical Course as of 01/13/23 1952  Sat Jan 13, 2023  1915 No acute DVT noted on ultrasound.  I spoke to the radiologist about the CT angiogram.  It appears that all of these occlusions particularly on the left side appear to be chronic, the patient has an extremely small but patent source of collateral blood flow noted.  I am surprised with this degree of occlusion as she does not have significant chronic claudication pain.  I will need to discuss the case with vascular surgery [MT]  1933 I spoke to Dr Karin Lieu from vascular surgery.  Given patient's extensive history including above-knee amputation, the surgeon tells me  there is very little to offer in terms of interventional therapy.  If there are concerns for claudication he would recommend pain control.  I discussed this with the patient in the room, he would prefer in this case to go home with pain medications.  We also discussed the possibility of sciatica or lumbar radiculopathy, as she now explains that she has had pain rating down her buttocks for a long time now, that is worse when she sits up.  She takes gabapentin for this typically, twice daily. [MT]    Clinical Course User Index [MT] Alechia Lezama, Kermit Balo, MD                             Medical Decision Making Amount and/or Complexity of Data Reviewed Labs: ordered. Radiology: ordered.  Risk OTC drugs. Prescription drug management.   This patient presents to the ED with concern for lower leg pain, left greater than right. This involves an extensive number of treatment options,  and is a complaint that carries with it a high risk of complications and morbidity.  The differential diagnosis includes sciatica versus lumbar radiculopathy versus claudication versus other  Co-morbidities that complicate the patient evaluation: History of significant vascular disease, arterial occlusions; at high risk of claudication  External records from outside source obtained and reviewed including vascular surgery evaluations  I ordered and personally interpreted labs.  The pertinent results include: No emergent findings.  No leukocytosis  I ordered imaging studies including DVT ultrasound, CT angiogram with runoff I independently visualized and interpreted imaging which showed no evident acute DVT.  There are chronic occlusions noted to the lower extremities, but no reported no occlusion I agree with the radiologist interpretation  I ordered medication including oxycodone and morphine for pain  I have reviewed the patients home medicines and have made adjustments as needed  I discussed the case with the vascular surgeon by phone, please see ED course  After the interventions noted above, I reevaluated the patient and found that they have: stayed the same   Dispostion:  At this time I do not see evidence of necrotizing fasciitis, infection, cauda equina syndrome, or necrosis per her exam.  After consideration of the diagnostic results and the patients response to treatment, I feel that the patent would benefit from close outpatient follow up.         Final Clinical Impression(s) / ED Diagnoses Final diagnoses:  Pain in both lower extremities    Rx / DC Orders ED Discharge Orders          Ordered    oxyCODONE (ROXICODONE) 5 MG immediate release tablet  Every 6 hours PRN        01/13/23 1936    senna-docusate (SENOKOT-S) 8.6-50 MG tablet  Daily PRN        01/13/23 1936              Terald Sleeper, MD 01/13/23 1954

## 2023-01-13 NOTE — ED Triage Notes (Signed)
Patient here POV from Home.  Endorses Bilateral Lower Extremity Pain since Last PM. Worse on Left and more consistent. No Known Trauma. BKA (Right) Last Year.   NAD Noted during Triage. A&Ox4. GCS 15. BIB Personal Wheelchair.

## 2023-01-15 ENCOUNTER — Ambulatory Visit: Payer: Self-pay | Admitting: *Deleted

## 2023-01-15 ENCOUNTER — Telehealth: Payer: Self-pay

## 2023-01-15 NOTE — Transitions of Care (Post Inpatient/ED Visit) (Signed)
01/15/2023  Name: Mercedes [name] MRN: [id number] DOB: September 09, 1955  Today's TOC FU Call Status: Today's TOC FU Call Status:: Successful TOC FU Call Competed TOC FU Call Complete Date: 01/15/23  Transition Care Management Follow-up Telephone Call Date of Discharge: 01/13/23 Discharge Facility: [facility] ([facility]) Type of Discharge: Emergency Department Reason for ED Visit: Other: (right leg pain) How have you been since you were released from the hospital?: Better Any questions or concerns?: No  Items Reviewed: Did you receive and understand the discharge instructions provided?: Yes Medications obtained,verified, and reconciled?: Yes (Medications Reviewed) Any new allergies since your discharge?: No Dietary orders reviewed?: Yes Do you have support at home?: Yes People in Home: child(ren), adult  Medications Reviewed Today: Medications Reviewed Today     Reviewed by Karena Addison, LPN (Licensed Practical Nurse) on 01/15/23 at 1416  Med List Status: <None>   Medication Order Taking? Sig Documenting Provider Last Dose Status Informant  acetaminophen (TYLENOL) 500 MG tablet [id number] Yes Take by mouth. [provider] Taking Active Child, Pharmacy Records           Med Note (CRUTHIS, Marcy Siren   Wed Aug 30, 2022  9:09 AM) Daughter is unaware of dose, frequency, or last dose of this medication.   amLODipine (NORVASC) 10 MG tablet [id number] Yes Take 1 tablet (10 mg total) by mouth daily. Lynnae January, NP Taking Active   bisacodyl (DULCOLAX) 10 MG suppository [id number] Yes Place 1 suppository (10 mg total) rectally daily as needed for moderate constipation. Lynnae January, NP Taking Active   docusate sodium (COLACE) 100 MG capsule [id number] Yes Take 1 capsule (100 mg total) by mouth 2 (two) times daily. Lynnae January, NP Taking Active   ELIQUIS 5 MG TABS tablet [id number] Yes TAKE 1 TABLET BY MOUTH TWICE  DAILY Burns, Bobette Mo, MD Taking Active Child, Pharmacy Records            Med Note (CRUTHIS, Marcy Siren   Wed Aug 30, 2022  9:10 AM) Daughter is unaware of date and time of last dose.   gabapentin (NEURONTIN) 300 MG capsule [id number] Yes Take 1 capsule (300 mg total) by mouth every 8 (eight) hours. Lynnae January, NP Taking Active   nitroGLYCERIN (NITROSTAT) 0.4 MG SL tablet [id number] Yes Place 1 tablet (0.4 mg total) under the tongue every 5 (five) minutes as needed for chest pain. Pincus Sanes, MD Taking Active Child, Pharmacy Records  oxyCODONE (ROXICODONE) 5 MG immediate release tablet [id number] Yes Take 1 tablet (5 mg total) by mouth every 6 (six) hours as needed for up to 20 doses for severe pain. Terald Sleeper, MD Taking Active   polyethylene glycol (MIRALAX / GLYCOLAX) 17 g packet [id number] Yes Take 17 g by mouth daily. Lynnae January, NP Taking Active   rosuvastatin (CRESTOR) 40 MG tablet [id number] Yes Take 1 tablet (40 mg total) by mouth daily. Corrin Parker, PA-C Taking Active Child, Pharmacy Records           Med Note (CRUTHIS, Con Memos Aug 30, 2022  9:10 AM) Daughter is unaware of last dose.   senna-docusate (SENOKOT-S) 8.6-50 MG tablet [id number] Yes Take 1 tablet by mouth at bedtime as needed for mild constipation. Lynnae January, NP Taking Active   senna-docusate (SENOKOT-S) 8.6-50 MG tablet [id number] Yes Take 1 tablet by mouth daily as needed for mild constipation. For constipation prevention while taking opioid narcotics (oxycodone) Trifan, Kermit Balo, MD Taking  Active             Home Care and Equipment/Supplies: Were Home Health Services Ordered?: NA Any new equipment or medical supplies ordered?: NA  Functional Questionnaire: Do you need assistance with bathing/showering or dressing?: No Do you need assistance with meal preparation?: No Do you need assistance with eating?: No Do you have difficulty maintaining continence: No Do you need assistance with getting out of bed/getting out of a chair/moving?: No Do you have  difficulty managing or taking your medications?: No  Follow up appointments reviewed: PCP Follow-up appointment confirmed?: NA Specialist Hospital Follow-up appointment confirmed?: Yes Date of Specialist follow-up appointment?: 01/31/23 Follow-Up Specialty Provider:: Dr Randie Heinz Do you need transportation to your follow-up appointment?: No Do you understand care options if your condition(s) worsen?: Yes-patient verbalized understanding    SIGNATURE Karena Addison, LPN [facility] Nurse Health Advisor Direct Dial [phone]

## 2023-01-15 NOTE — Chronic Care Management (AMB) (Signed)
   01/15/2023  VENETIA WELFORD 1955/11/18 [id number]   Patient is not participating in chronic care management (CCM) services , enrollment status changed to previously enrolled.  Irving Shows [facility], BSN RN Case Manager [phone]

## 2023-01-31 ENCOUNTER — Encounter: Payer: 59 | Admitting: Vascular Surgery

## 2023-02-01 ENCOUNTER — Other Ambulatory Visit: Payer: Self-pay | Admitting: Family Medicine

## 2023-02-01 DIAGNOSIS — E2839 Other primary ovarian failure: Secondary | ICD-10-CM

## 2023-02-14 ENCOUNTER — Telehealth: Payer: Self-pay | Admitting: Internal Medicine

## 2023-02-14 NOTE — Telephone Encounter (Signed)
Contacted Mercedes [name] to schedule their annual wellness visit. Patient declined to schedule AWV at this time. Patient has transferred care.   [facility] Care Guide [facility] AWV TEAM Direct Dial: [phone]

## 2023-02-21 ENCOUNTER — Encounter: Payer: 59 | Admitting: Vascular Surgery

## 2023-03-07 ENCOUNTER — Encounter: Payer: Self-pay | Admitting: Vascular Surgery

## 2023-03-07 ENCOUNTER — Ambulatory Visit (INDEPENDENT_AMBULATORY_CARE_PROVIDER_SITE_OTHER): Payer: 59 | Admitting: Vascular Surgery

## 2023-03-07 VITALS — BP 156/76 | HR 48 | Temp 98.1°F | Resp 20 | Ht 65.0 in | Wt 89.0 lb

## 2023-03-07 DIAGNOSIS — T8789 Other complications of amputation stump: Secondary | ICD-10-CM

## 2023-03-07 DIAGNOSIS — M79609 Pain in unspecified limb: Secondary | ICD-10-CM | POA: Diagnosis not present

## 2023-03-07 NOTE — Progress Notes (Signed)
Patient ID: Mercedes [name], female   DOB: February 09, 1956, 67 y.o.   MRN: [id number]  Reason for Consult: Follow-up   Referred by Pincus Sanes, MD  Subjective:     HPI:  Mercedes [name] is a 67 y.o. female history of bilateral knee amputations most recently left was revised for wound.  She now has bilateral thigh pain particularly at night.  She does not have any wounds.  She is staying at home using a wheelchair to get around.  She has taken Neurontin without much resolution.  Recently had a stroke seems to have recovered well.  She previously had phantom pain in the right greater than the left but this has resolved.  Past Medical History:  Diagnosis Date   Abnormal LFTs    a. in the past when on Lipitor   Anemia    as a young woman   Anxiety    Arnold-Chiari malformation, type I (HCC)    MRI brain 02/2010   CAD (coronary artery disease)    a. NSTEMI 07/2009: LHC - D2 40%, LAD irreg., EF 50% with apical AK (tako-tsubo CM);  b. Inf STEMI (04/2013): Tx Promus DES to CFX;  c. 08/2013 Lexi CL: No ischemia, dist ant/ap/inf-ap infarct, EF  d. 06/2016 NSTEMI with LM disease: s/p CABG on 07/17/2016 w/ LIMA-LAD, SVG-OM, and SVG-dRCA.   DEPRESSION    Dizziness    ? 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 anticoagulated with Eliquis 10/2014   Presence of permanent cardiac pacemaker 10/2014   leadless permanent pacemaker   RA (rheumatoid arthritis) (HCC)    prior tx by Dr. Dareen Piano   Sjogren's syndrome (HCC) 06/02/2013   pt denies this (12/01/13)   SLE (systemic lupus erythematosus) (HCC) dx 05/2013   follows with rheum Dareen Piano)   Stroke  [facility])    March 2023, weakness in right arm is improving   Tachy-brady syndrome (HCC)    a. 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   f/u echo 09/2009: EF 50-55%, mild LVH, mod diast dysfxn, mild apical HK   Wears dentures    Wears glasses    Family History  Problem Relation Age of Onset   Arthritis Mother    Emphysema Mother    Arthritis Father    COPD Father    Heart disease Father    Diabetes Paternal Aunt        paternal Aunts   Heart disease Paternal Aunt    Thyroid disease Neg Hx    Past Surgical History:  Procedure Laterality Date   ABDOMINAL AORTAGRAM N/A 11/24/2013   Procedure: ABDOMINAL AORTAGRAM WITH BILATERAL RUNOFF WITH POSSIBLE INTERVENTION;  Surgeon: Chuck Hint, MD;  Location: [facility] CATH LAB;  Service: Cardiovascular;  Laterality: N/A;   ABDOMINAL AORTOGRAM W/LOWER EXTREMITY N/A 04/25/2019   Procedure: ABDOMINAL AORTOGRAM W/LOWER EXTREMITY;  Surgeon: Sherren Kerns, MD;  [facility]: [facility] INVASIVE CV LAB;  Service: Cardiovascular;  Laterality: N/A;   ABDOMINAL AORTOGRAM W/LOWER EXTREMITY Bilateral 05/15/2019   Procedure: ABDOMINAL AORTOGRAM W/LOWER EXTREMITY;  Surgeon: Nada Libman,  MD;  Location: [facility] INVASIVE CV LAB;  Service: Cardiovascular;  Laterality: Bilateral;   ABDOMINAL AORTOGRAM W/LOWER EXTREMITY N/A 08/23/2020   Procedure: ABDOMINAL AORTOGRAM W/LOWER EXTREMITY;  Surgeon: Maeola Harman, MD;  Location: [facility] INVASIVE CV LAB;  Service: Cardiovascular;  Laterality: N/A;   ABDOMINAL AORTOGRAM W/LOWER EXTREMITY N/A 10/29/2020   Procedure: ABDOMINAL AORTOGRAM W/LOWER EXTREMITY;  Surgeon: Sherren Kerns, MD;  Location: [facility] INVASIVE CV LAB;  Service: Cardiovascular;  Laterality: N/A;   AMPUTATION Left 12/02/2013   Procedure: LEFT 1ST TOE AMPUTATION;  Surgeon: Sherren Kerns, MD;  Location: [facility] OR;  Service: Vascular;  Laterality: Left;   AMPUTATION Left 07/16/2019   Procedure: LEFT TRANSMETATARSAL AMPUTATION;   Surgeon: Nadara Mustard, MD;  Location: [facility] OR;  Service: Orthopedics;  Laterality: Left;   AMPUTATION Left 10/01/2019   Procedure: LEFT AMPUTATION ABOVE KNEE;  Surgeon: Sherren Kerns, MD;  Location: [facility] OR;  Service: Vascular;  Laterality: Left;   AMPUTATION Right 08/31/2020   Procedure: Right first toe amputation;  Surgeon: Sherren Kerns, MD;  Location: [facility] OR;  Service: Vascular;  Laterality: Right;   AMPUTATION Right 11/23/2020   Procedure: RIGHT AMPUTATION ABOVE KNEE;  Surgeon: Sherren Kerns, MD;  Location: [facility] OR;  Service: Vascular;  Laterality: Right;   AMPUTATION Left 03/14/2022   Procedure: REVISION LEFT ABOVE KNEE AMPUTATION;  Surgeon: Maeola Harman, MD;  Location: [facility] OR;  Service: Vascular;  Laterality: Left;   APPLICATION OF WOUND VAC Left 11/03/2019   Procedure: Application Of Wound Vac, left lower extremity;  Surgeon: Sherren Kerns, MD;  Location: [facility] OR;  Service: Vascular;  Laterality: Left;   CARDIAC CATHETERIZATION N/A 07/12/2016   Procedure: Left Heart Cath and Coronary Angiography;  Surgeon: Lennette Bihari, MD;  [facility]: [facility] INVASIVE CV LAB;  Service: Cardiovascular;  Laterality: N/A;   CARDIAC CATHETERIZATION N/A 07/14/2016   Procedure: Intravascular Ultrasound/IVUS;  Surgeon: Yvonne Kendall, MD;  Location: [facility] INVASIVE CV LAB;  Service: Cardiovascular;  Laterality: N/A;   COLONOSCOPY     CORONARY ANGIOPLASTY  04/2013   CORONARY ARTERY BYPASS GRAFT N/A 07/17/2016   Procedure: CORONARY ARTERY BYPASS GRAFTING (CABG)x3 using right greater saphenous vein harvested endoscopiclly and left internal mammary artery;  Surgeon: Kerin Perna, MD;  Location: [facility] OR;  Service: Open Heart Surgery;  Laterality: N/A;   ENDARTERECTOMY FEMORAL Left 05/12/2019   Procedure: LEFT Femoral Endarterectomy;  Surgeon: Sherren Kerns, MD;  Location: [facility] OR;  Service: Vascular;  Laterality: Left;   FEMORAL-POPLITEAL BYPASS GRAFT Left 12/02/2013   Procedure:  LEFT FEMORAL-BELOW KNEE  POPLITEAL ARTERY BYPASS GRAFT;  Surgeon: Sherren Kerns, MD;  Location: [facility] OR;  Service: Vascular;  Laterality: Left;   FEMORAL-POPLITEAL BYPASS GRAFT Left 09/25/2019   Procedure: THROMBECTOMY and PROPATEN BYPASS  FEMORAL TO BELOW KNEE POPLITEAL ARTERY BYPASS GRAFT LEFT LEG;  Surgeon: Larina Earthly, MD;  Location: [facility] OR;  Service: Vascular;  Laterality: Left;   FEMORAL-POPLITEAL BYPASS GRAFT Right 08/24/2020   Procedure: RIGHT FEMORAL-BELOW KNEE POPLITEAL ARTERY BYPASS USING GORE PROPATEN GRAFT;  Surgeon: Maeola Harman, MD;  Location: [facility] OR;  Service: Vascular;  Laterality: Right;   FEMORAL-POPLITEAL BYPASS GRAFT Right 11/01/2020   Procedure: Thrombectomy and Revision of Right Femoral to below knee Bypass with Interpostional graft to Tibial / peroneal Trunk and Endarterectomy Tibial Weston Settle Trunk.;  Surgeon: Sherren Kerns, MD;  Location: [facility] OR;  Service: Vascular;  Laterality: Right;   INTRAOPERATIVE ARTERIOGRAM Right 11/01/2020   Procedure: [facility] RIGHT  LOWER EXTREMITY INTRA OPERATIVE ARTERIOGRAM;  Surgeon: Sherren Kerns, MD;  Location: [facility] OR;  Service: Vascular;  Laterality: Right;   IR CT HEAD LTD  08/28/2022   IR PERCUTANEOUS ART THROMBECTOMY/INFUSION INTRACRANIAL INC DIAG ANGIO  08/28/2022   IR US GUIDE VASC ACCESS RIGHT  08/28/2022   LEFT HEART CATHETERIZATION WITH CORONARY ANGIOGRAM N/A 05/19/2013   Procedure: LEFT HEART CATHETERIZATION WITH CORONARY ANGIOGRAM;  Surgeon: Micheline Chapman, MD;  Location: [facility] CATH LAB;  Service: Cardiovascular;  Laterality: N/A;   LEFT HEART CATHETERIZATION WITH CORONARY ANGIOGRAM N/A 10/28/2014   Procedure: LEFT HEART CATHETERIZATION WITH CORONARY ANGIOGRAM;  Surgeon: Iran Ouch, MD;  [facility]: [facility] CATH LAB;  Service: Cardiovascular;  Laterality: N/A;   LOWER EXTREMITY ANGIOGRAM Left 05/12/2019   Procedure: Lower Extremity Angiogram;  Surgeon: Sherren Kerns, MD;  Location: [facility] OR;  Service: Vascular;  Laterality: Left;   PATCH ANGIOPLASTY  Left 05/12/2019   Procedure: Left Femoral Patch Angioplasty USING CADAVERIC SAPHENOUS VEIN;  Surgeon: Sherren Kerns, MD;  Location: [facility] OR;  Service: Vascular;  Laterality: Left;   PERCUTANEOUS CORONARY STENT INTERVENTION (PCI-S)  05/19/2013   Procedure: PERCUTANEOUS CORONARY STENT INTERVENTION (PCI-S);  Surgeon: Micheline Chapman, MD;  Location: [facility] CATH LAB;  Service: Cardiovascular;;   PERIPHERAL VASCULAR INTERVENTION Right 10/29/2020   Procedure: PERIPHERAL VASCULAR INTERVENTION;  Surgeon: Sherren Kerns, MD;  Location: [facility] INVASIVE CV LAB;  Service: Cardiovascular;  Laterality: Right;  common iliac   PERMANENT PACEMAKER INSERTION N/A 11/04/2014   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   RADIOLOGY WITH ANESTHESIA N/A 08/28/2022   Procedure: RADIOLOGY WITH ANESTHESIA;  Surgeon: Radiologist, Medication, MD;  Location: [facility] OR;  Service: Radiology;  Laterality: N/A;   TEE WITHOUT CARDIOVERSION N/A 07/17/2016   Procedure: TRANSESOPHAGEAL ECHOCARDIOGRAM (TEE);  Surgeon: Kerin Perna, MD;  Location: [facility] OR;  Service: Open Heart Surgery;  Laterality: N/A;   THROMBECTOMY FEMORAL ARTERY Left 05/12/2019   Procedure: Left Ilio-Femoral Artery and Femoral-popliteal Graft Thrombectomy;  Surgeon: Sherren Kerns, MD;  Location: [facility] OR;  Service: Vascular;  Laterality: Left;   THROMBECTOMY OF BYPASS GRAFT FEMORAL- POPLITEAL ARTERY Right 11/01/2020   Procedure: POSSIBLE THROMBOLYSIS VERSUS OPEN THROMBECTOMY AND REVSION OF RIGHT FEMORAL-POPLITEAL ARTERY BYPASS;  Surgeon: Sherren Kerns, MD;  Location[facility] OR;  Service: Vascular;  Laterality: Right;   TUBAL LIGATION     VISCERAL ANGIOGRAPHY N/A 04/25/2019   Procedure: MESENTRIC  ANGIOGRAPHY;  Surgeon: Sherren Kerns, MD;  [facility] INVASIVE CV LAB;  Service: Cardiovascular;  Laterality: N/A;   WOUND DEBRIDEMENT Left 11/03/2019   Procedure: Debridement Wound of left above the knee amputation;  Surgeon: Sherren Kerns, MD;  Location: [facility]  OR;  Service: Vascular;  Laterality: Left;    Short Social History:  Social History   Tobacco Use   Smoking status: Former    Types: Cigarettes    Quit date: 2019    Years since quitting: 5.5   Smokeless tobacco: Never   Tobacco comments:       Substance Use Topics   Alcohol use: No    Alcohol/week: 0.0 standard drinks of alcohol    Allergies  Allergen Reactions   Brilinta [Ticagrelor] Anaphylaxis, Shortness Of Breath and Other (See Comments)    Also, chest tightness   Latex Rash   Tape Rash    Current Outpatient Medications  Medication Sig Dispense Refill   acetaminophen (TYLENOL) 500 MG tablet Take by mouth.     amLODipine (  NORVASC) 10 MG tablet Take 1 tablet (10 mg total) by mouth daily. 30 tablet 0   bisacodyl (DULCOLAX) 10 MG suppository Place 1 suppository (10 mg total) rectally daily as needed for moderate constipation. 12 suppository 0   docusate sodium (COLACE) 100 MG capsule Take 1 capsule (100 mg total) by mouth 2 (two) times daily. 10 capsule 0   ELIQUIS 5 MG TABS tablet TAKE 1 TABLET BY MOUTH TWICE  DAILY 180 tablet 3   gabapentin (NEURONTIN) 300 MG capsule Take 1 capsule (300 mg total) by mouth every 8 (eight) hours. 30 capsule 0   nitroGLYCERIN (NITROSTAT) 0.4 MG SL tablet Place 1 tablet (0.4 mg total) under the tongue every 5 (five) minutes as needed for chest pain. 25 tablet 11   oxyCODONE (ROXICODONE) 5 MG immediate release tablet Take 1 tablet (5 mg total) by mouth every 6 (six) hours as needed for up to 20 doses for severe pain. 20 tablet 0   polyethylene glycol (MIRALAX / GLYCOLAX) 17 g packet Take 17 g by mouth daily. 14 each 0   rosuvastatin (CRESTOR) 40 MG tablet Take 1 tablet (40 mg total) by mouth daily. 90 tablet 3   senna-docusate (SENOKOT-S) 8.6-50 MG tablet Take 1 tablet by mouth at bedtime as needed for mild constipation. 14 tablet 0   No current facility-administered medications for this visit.    Review of Systems  Constitutional:   Constitutional negative. HENT: HENT negative.  Eyes: Eyes negative.  Cardiovascular: Cardiovascular negative.  GI: Gastrointestinal negative.  Musculoskeletal: Positive for leg pain.  Skin: Skin negative.  Neurological: Neurological negative. Hematologic: Hematologic/lymphatic negative.  Psychiatric: Psychiatric negative.        Objective:  Objective   Vitals:   03/07/23 1421  BP: (!) 156/76  Pulse: (!) 48  Resp: 20  Temp: 98.1 F (36.7 C)  SpO2: 97%  Weight: 89 lb (40.4 kg)  Height: 5\' 5"  (1.651 m)   Body mass index is 14.81 kg/m.  Physical Exam HENT:     Mouth/Throat:     Mouth: Mucous membranes are moist.  Eyes:     Pupils: Pupils are equal, round, and reactive to light.  Cardiovascular:     Pulses:          Femoral pulses are 0 on the right side and 0 on the left side. Abdominal:     General: Abdomen is flat.     Palpations: Abdomen is soft.  Musculoskeletal:     Comments: Residual limbs as pictured below  Skin:    Capillary Refill: Capillary refill takes less than 2 seconds.  Neurological:     Mental Status: She is alert.  Psychiatric:        Mood and Affect: Mood normal.        Thought Content: Thought content normal.        Judgment: Judgment normal.        Data: US IMPRESSION: 1. No evidence of LOWER extremity DVT, but slightly limited evaluation of the femoral veins. Popliteal and calf veins not evaluated due to above the knee amputations.     Assessment/Plan:    18 female history of lower extremity amputations above the knee with most recent revision above-the-knee on the left to a higher amputation for wound.  She does have a tight area over the bone on the right but there is currently no wound and her pain is higher up in the thighs.  Reliably feel femoral pulses but I would not revascularize her  given that she does not have wounds and unlikely the cause of the pain.  She can see me on an as-needed basis.     Maeola Harman  MD [facility]

## 2023-05-31 ENCOUNTER — Encounter (HOSPITAL_COMMUNITY): Payer: Self-pay

## 2023-05-31 ENCOUNTER — Emergency Department (HOSPITAL_COMMUNITY)
Admission: EM | Admit: 2023-05-31 | Discharge: 2023-05-31 | Disposition: A | Discharge status: Home / Self Care | Payer: 59 | Attending: Emergency Medicine | Admitting: Emergency Medicine

## 2023-05-31 ENCOUNTER — Other Ambulatory Visit: Payer: Self-pay

## 2023-05-31 DIAGNOSIS — I2510 Atherosclerotic heart disease of native coronary artery without angina pectoris: Secondary | ICD-10-CM | POA: Diagnosis not present

## 2023-05-31 DIAGNOSIS — Z8673 Personal history of transient ischemic attack (TIA), and cerebral infarction without residual deficits: Secondary | ICD-10-CM | POA: Diagnosis not present

## 2023-05-31 DIAGNOSIS — I10 Essential (primary) hypertension: Secondary | ICD-10-CM | POA: Insufficient documentation

## 2023-05-31 DIAGNOSIS — Z91040 Latex allergy status: Secondary | ICD-10-CM | POA: Insufficient documentation

## 2023-05-31 DIAGNOSIS — Z7901 Long term (current) use of anticoagulants: Secondary | ICD-10-CM | POA: Insufficient documentation

## 2023-05-31 DIAGNOSIS — Z79899 Other long term (current) drug therapy: Secondary | ICD-10-CM | POA: Diagnosis not present

## 2023-05-31 NOTE — ED Triage Notes (Signed)
Patient BIB [facility] from PCP office for high blood pressure. PCP reports BP 204/116, EMS got 212/102, patient remained asymptomatic throughout transport, A&Ox4, has hx of HTN and takes meds for same. Will require assistance getting wheelchair home.

## 2023-05-31 NOTE — Progress Notes (Signed)
Patient is wheelchair bound and has no transportation to get home. CSW arranged [facility] to bring patient home. [facility] will pick patient up in about a hour from the emergency room entrance.

## 2023-05-31 NOTE — ED Provider Notes (Signed)
[place] EMERGENCY DEPARTMENT AT [facility] Provider Note   CSN: [id number] Arrival date & time: 05/31/23  1644     History  Chief Complaint  Patient presents with   Hypertension    Mercedes [name] is a 67 y.o. female.   Hypertension  67 year old female history of CAD, GERD, hypertension, hyperlipidemia, prior stroke, atrial fibrillation on Eliquis presenting for asymptomatic hypertension.  Patient states she went to see her PCP today for routine appointment.  They sent her to the ED because of blood pressure was in the 200s systolic.  She has been asymptomatic.  She takes metoprolol 25 mg once daily although per her document from her PCP she supposed to take it twice daily and also be taking nifedipine nightly.  She states she knows about this medication was not had it for last month as it has not been delivered yet.  She told her doctor about this today.  She checks her blood pressure only occasionally at home.  Has not had any chest pain, shortness of breath, headache, dizziness, lightheadedness, weakness, numbness.  He has baseline left-sided weakness from prior stroke which is unchanged.  She would like to go home as she has not had any symptoms.  She is not sure why they sent her here.     Home Medications Prior to Admission medications   Medication Sig Start Date End Date Taking? Authorizing Provider  acetaminophen (TYLENOL) 500 MG tablet Take by mouth.    [provider]  amLODipine (NORVASC) 10 MG tablet Take 1 tablet (10 mg total) by mouth daily. 09/19/22   Lynnae January, NP  bisacodyl (DULCOLAX) 10 MG suppository Place 1 suppository (10 mg total) rectally daily as needed for moderate constipation. 09/18/22   Lynnae January, NP  docusate sodium (COLACE) 100 MG capsule Take 1 capsule (100 mg total) by mouth 2 (two) times daily. 09/18/22   Lynnae January, NP  ELIQUIS 5 MG TABS tablet TAKE 1 TABLET BY MOUTH TWICE  DAILY 04/20/22   Pincus Sanes, MD   gabapentin (NEURONTIN) 300 MG capsule Take 1 capsule (300 mg total) by mouth every 8 (eight) hours. 09/18/22   Lynnae January, NP  nitroGLYCERIN (NITROSTAT) 0.4 MG SL tablet Place 1 tablet (0.4 mg total) under the tongue every 5 (five) minutes as needed for chest pain. 12/29/21   Burns, Bobette Mo, MD  oxyCODONE (ROXICODONE) 5 MG immediate release tablet Take 1 tablet (5 mg total) by mouth every 6 (six) hours as needed for up to 20 doses for severe pain. 01/13/23   Terald Sleeper, MD  polyethylene glycol (MIRALAX / GLYCOLAX) 17 g packet Take 17 g by mouth daily. 09/19/22   Lynnae January, NP  rosuvastatin (CRESTOR) 40 MG tablet Take 1 tablet (40 mg total) by mouth daily. 06/29/22   Marjie Skiff E, PA-C  senna-docusate (SENOKOT-S) 8.6-50 MG tablet Take 1 tablet by mouth at bedtime as needed for mild constipation. 09/18/22   Lynnae January, NP      Allergies    Brilinta [ticagrelor], Latex, and Tape    Review of Systems   Review of Systems Review of systems completed and notable as per HPI.  ROS otherwise negative.   Physical Exam Updated Vital Signs BP (!) 178/104 (BP Location: Right Arm)   Pulse (!) 59   Temp 98 F (36.7 C) (Oral)   Resp 16   Ht 5\' 5"  (1.651 m)   Wt 41 kg  SpO2 100%   BMI 15.04 kg/m  Physical Exam Vitals and nursing note reviewed.  Constitutional:      General: She is not in acute distress.    Appearance: She is well-developed.  HENT:     Head: Normocephalic and atraumatic.     Nose: Nose normal.     Mouth/Throat:     Mouth: Mucous membranes are moist.     Pharynx: Oropharynx is clear.  Eyes:     Extraocular Movements: Extraocular movements intact.     Conjunctiva/sclera: Conjunctivae normal.     Pupils: Pupils are equal, round, and reactive to light.  Cardiovascular:     Rate and Rhythm: Normal rate and regular rhythm.     Heart sounds: No murmur heard. Pulmonary:     Effort: Pulmonary effort is normal. No respiratory distress.     Breath sounds:  Normal breath sounds.  Abdominal:     Palpations: Abdomen is soft.     Tenderness: There is no abdominal tenderness.  Musculoskeletal:        General: No swelling.     Cervical back: Normal range of motion and neck supple. No rigidity or tenderness.  Skin:    General: Skin is warm and dry.     Capillary Refill: Capillary refill takes less than 2 seconds.  Neurological:     Mental Status: She is alert and oriented to person, place, and time. Mental status is at baseline.     Cranial Nerves: No cranial nerve deficit.     Sensory: No sensory deficit.     Coordination: Coordination normal.     Gait: Gait normal.     Deep Tendon Reflexes: Reflexes normal.     Comments: Status post BKA in bilateral lower extremities.  She has baseline 4-5 strength in the left upper extremity but can still move antigravity without drift.  She has otherwise full strength in her extremities.  Normal finger-to-nose.  No cranial nerve deficits.  Psychiatric:        Mood and Affect: Mood normal.     ED Results / Procedures / Treatments   Labs (all labs ordered are listed, but only abnormal results are displayed) Labs Reviewed - No data to display  EKG None  Radiology No results found.  Procedures Procedures    Medications Ordered in ED Medications - No data to display  ED Course/ Medical Decision Making/ A&P                                 Medical Decision Making  Medical Decision Making:   Mercedes [name] is a 67 y.o. female who presented to the ED today with asymptomatic hypertension.  Reported was hypertensive to the 200 systolic at her PCP office although was completely asymptomatic.  It appears she has not been taking her metoprolol as directed and has not yet received her nifedipine which is likely contributing to her chronic hypertension.  However on my examination her blood pressure was in the 150s systolic and she remains completely asymptomatic.  She would like to go home and she has  been in here all day.  Given that she is completely asymptomatic I do not see any indication for workup.  She has no signs of hypertensive emergency, ACS, PE, dissection.  I do not think she needs emergent labs or imaging at this time.  I did recommend she call her doctor first thing tomorrow to make  sure she gets those medications filled that she is supposed to be taking, I considered prescribing myself, however she states they are already being sent to her I do not want her to actually take both my prescription and then prescription.  She is comfortable this plan.  Discharged in stable condition.   Patient placed on continuous vitals and telemetry monitoring while in ED which was reviewed periodically.  Reviewed and confirmed nursing documentation for past medical history, family history, social history.  Patient's presentation is most consistent with exacerbation of chronic illness.           Final Clinical Impression(s) / ED Diagnoses Final diagnoses:  Primary hypertension    Rx / DC Orders ED Discharge Orders     None         Laurence Spates, MD 05/31/23 2330

## 2023-05-31 NOTE — Discharge Instructions (Signed)
You were seen today for high blood pressure.  I recommend you call your doctor first thing tomorrow and continue taking her prescribed medications for blood pressure.  If you develop any symptoms including headache, vision changes, weakness, numbness, chest pain, shortness of breath you should return to the ED.

## 2023-06-01 ENCOUNTER — Other Ambulatory Visit: Payer: Self-pay | Admitting: Family Medicine

## 2023-06-01 DIAGNOSIS — Z89612 Acquired absence of left leg above knee: Secondary | ICD-10-CM

## 2023-06-01 DIAGNOSIS — Z89611 Acquired absence of right leg above knee: Secondary | ICD-10-CM

## 2023-06-08 ENCOUNTER — Other Ambulatory Visit: Payer: Self-pay | Admitting: Family Medicine

## 2023-06-08 DIAGNOSIS — Z89612 Acquired absence of left leg above knee: Secondary | ICD-10-CM

## 2023-06-08 DIAGNOSIS — Z89611 Acquired absence of right leg above knee: Secondary | ICD-10-CM

## 2023-07-23 ENCOUNTER — Ambulatory Visit: Payer: 59 | Admitting: Internal Medicine

## 2023-07-25 ENCOUNTER — Ambulatory Visit: Payer: 59 | Admitting: Internal Medicine

## 2023-07-30 NOTE — Progress Notes (Unsigned)
    Subjective:    Patient ID: Mercedes [name], female    DOB: 09/11/1955, 67 y.o.   MRN: [id number]      HPI Mercedes [name] is here for No chief complaint on file.    Sore on leg    Medications and allergies reviewed with patient and updated if appropriate.  Current Outpatient Medications on File Prior to Visit  Medication Sig Dispense Refill   acetaminophen (TYLENOL) 500 MG tablet Take by mouth.     amLODipine (NORVASC) 10 MG tablet Take 1 tablet (10 mg total) by mouth daily. 30 tablet 0   bisacodyl (DULCOLAX) 10 MG suppository Place 1 suppository (10 mg total) rectally daily as needed for moderate constipation. 12 suppository 0   docusate sodium (COLACE) 100 MG capsule Take 1 capsule (100 mg total) by mouth 2 (two) times daily. 10 capsule 0   ELIQUIS 5 MG TABS tablet TAKE 1 TABLET BY MOUTH TWICE  DAILY 180 tablet 3   gabapentin (NEURONTIN) 300 MG capsule Take 1 capsule (300 mg total) by mouth every 8 (eight) hours. 30 capsule 0   nitroGLYCERIN (NITROSTAT) 0.4 MG SL tablet Place 1 tablet (0.4 mg total) under the tongue every 5 (five) minutes as needed for chest pain. 25 tablet 11   oxyCODONE (ROXICODONE) 5 MG immediate release tablet Take 1 tablet (5 mg total) by mouth every 6 (six) hours as needed for up to 20 doses for severe pain. 20 tablet 0   polyethylene glycol (MIRALAX / GLYCOLAX) 17 g packet Take 17 g by mouth daily. 14 each 0   rosuvastatin (CRESTOR) 40 MG tablet Take 1 tablet (40 mg total) by mouth daily. 90 tablet 3   senna-docusate (SENOKOT-S) 8.6-50 MG tablet Take 1 tablet by mouth at bedtime as needed for mild constipation. 14 tablet 0   No current facility-administered medications on file prior to visit.    Review of Systems     Objective:  There were no vitals filed for this visit. BP Readings from Last 3 Encounters:  05/31/23 (!) 178/104  03/07/23 (!) 156/76  01/13/23 (!) 177/92   Wt Readings from Last 3 Encounters:  05/31/23 90 lb 6.2 oz (41 kg)  03/07/23  89 lb (40.4 kg)  01/13/23 89 lb 11.2 oz (40.7 kg)   There is no height or weight on file to calculate BMI.    Physical Exam         Assessment & Plan:    See Problem List for Assessment and Plan of chronic medical problems.

## 2023-07-31 ENCOUNTER — Encounter: Payer: Self-pay | Admitting: Internal Medicine

## 2023-07-31 ENCOUNTER — Ambulatory Visit (INDEPENDENT_AMBULATORY_CARE_PROVIDER_SITE_OTHER): Payer: 59 | Admitting: Internal Medicine

## 2023-07-31 VITALS — BP 126/90 | HR 80 | Temp 98.4°F

## 2023-07-31 DIAGNOSIS — L98491 Non-pressure chronic ulcer of skin of other sites limited to breakdown of skin: Secondary | ICD-10-CM | POA: Insufficient documentation

## 2023-07-31 MED ORDER — AMOXICILLIN-POT CLAVULANATE 875-125 MG PO TABS: 1.0000 | ORAL_TABLET | Freq: Two times a day (BID) | ORAL | 0 refills | Status: AC

## 2023-07-31 MED ORDER — MUPIROCIN 2 % EX OINT: 1.0000 | TOPICAL_OINTMENT | Freq: Two times a day (BID) | CUTANEOUS | 0 refills | Status: DC

## 2023-07-31 NOTE — Assessment & Plan Note (Signed)
Subacute Present for 4-5 months or longer Nonhealing Has been on a 14-day course of antibiotics with some improvement, but not resolution Area is tender Looks like a protruding ulcer or pyogenic granuloma like lesion Will refer to wound center since it is an open wound that is not healing and will unlikely be able to get her in with dermatology anytime soon Start Augmentin 875-125 mg twice daily x 10 days Bactroban ointment twice daily

## 2023-07-31 NOTE — Patient Instructions (Addendum)
         Medications changes include :   an oral and topical antibiotic    A referral was ordered wound center and someone will call you to schedule an appointment.     Return if symptoms worsen or fail to improve.

## 2023-09-12 ENCOUNTER — Other Ambulatory Visit: Payer: Self-pay

## 2023-09-12 ENCOUNTER — Inpatient Hospital Stay (HOSPITAL_COMMUNITY): Payer: 59

## 2023-09-12 ENCOUNTER — Inpatient Hospital Stay (HOSPITAL_COMMUNITY)
Admission: EM | Admit: 2023-09-12 | Discharge: 2023-09-17 | DRG: 065 | Disposition: A | Discharge status: Home Health | Payer: 59 | Attending: Internal Medicine | Admitting: Internal Medicine

## 2023-09-12 ENCOUNTER — Emergency Department (HOSPITAL_COMMUNITY): Payer: 59

## 2023-09-12 ENCOUNTER — Encounter (HOSPITAL_COMMUNITY): Payer: Self-pay

## 2023-09-12 ENCOUNTER — Encounter (HOSPITAL_COMMUNITY)
Admission: EM | Disposition: A | Discharge status: Home Health | Payer: Self-pay | Source: Home / Self Care | Attending: Internal Medicine

## 2023-09-12 DIAGNOSIS — Z89611 Acquired absence of right leg above knee: Secondary | ICD-10-CM | POA: Diagnosis not present

## 2023-09-12 DIAGNOSIS — I480 Paroxysmal atrial fibrillation: Secondary | ICD-10-CM | POA: Diagnosis present

## 2023-09-12 DIAGNOSIS — E1159 Type 2 diabetes mellitus with other circulatory complications: Secondary | ICD-10-CM

## 2023-09-12 DIAGNOSIS — I10 Essential (primary) hypertension: Secondary | ICD-10-CM

## 2023-09-12 DIAGNOSIS — F32A Depression, unspecified: Secondary | ICD-10-CM | POA: Diagnosis present

## 2023-09-12 DIAGNOSIS — F17210 Nicotine dependence, cigarettes, uncomplicated: Secondary | ICD-10-CM | POA: Diagnosis present

## 2023-09-12 DIAGNOSIS — G40909 Epilepsy, unspecified, not intractable, without status epilepticus: Secondary | ICD-10-CM | POA: Diagnosis present

## 2023-09-12 DIAGNOSIS — M329 Systemic lupus erythematosus, unspecified: Secondary | ICD-10-CM | POA: Diagnosis present

## 2023-09-12 DIAGNOSIS — E039 Hypothyroidism, unspecified: Secondary | ICD-10-CM | POA: Diagnosis present

## 2023-09-12 DIAGNOSIS — Z8673 Personal history of transient ischemic attack (TIA), and cerebral infarction without residual deficits: Secondary | ICD-10-CM

## 2023-09-12 DIAGNOSIS — I495 Sick sinus syndrome: Secondary | ICD-10-CM

## 2023-09-12 DIAGNOSIS — Z993 Dependence on wheelchair: Secondary | ICD-10-CM | POA: Diagnosis not present

## 2023-09-12 DIAGNOSIS — E785 Hyperlipidemia, unspecified: Secondary | ICD-10-CM | POA: Diagnosis present

## 2023-09-12 DIAGNOSIS — R471 Dysarthria and anarthria: Secondary | ICD-10-CM | POA: Diagnosis present

## 2023-09-12 DIAGNOSIS — Z951 Presence of aortocoronary bypass graft: Secondary | ICD-10-CM

## 2023-09-12 DIAGNOSIS — Z89612 Acquired absence of left leg above knee: Secondary | ICD-10-CM | POA: Diagnosis not present

## 2023-09-12 DIAGNOSIS — I69354 Hemiplegia and hemiparesis following cerebral infarction affecting left non-dominant side: Secondary | ICD-10-CM

## 2023-09-12 DIAGNOSIS — R1310 Dysphagia, unspecified: Secondary | ICD-10-CM | POA: Diagnosis present

## 2023-09-12 DIAGNOSIS — Z1152 Encounter for screening for COVID-19: Secondary | ICD-10-CM

## 2023-09-12 DIAGNOSIS — R54 Age-related physical debility: Secondary | ICD-10-CM | POA: Diagnosis present

## 2023-09-12 DIAGNOSIS — Z9861 Coronary angioplasty status: Secondary | ICD-10-CM | POA: Diagnosis not present

## 2023-09-12 DIAGNOSIS — I639 Cerebral infarction, unspecified: Principal | ICD-10-CM | POA: Diagnosis present

## 2023-09-12 DIAGNOSIS — E0590 Thyrotoxicosis, unspecified without thyrotoxic crisis or storm: Secondary | ICD-10-CM | POA: Diagnosis present

## 2023-09-12 DIAGNOSIS — R569 Unspecified convulsions: Secondary | ICD-10-CM

## 2023-09-12 DIAGNOSIS — I739 Peripheral vascular disease, unspecified: Secondary | ICD-10-CM | POA: Diagnosis not present

## 2023-09-12 DIAGNOSIS — I2510 Atherosclerotic heart disease of native coronary artery without angina pectoris: Secondary | ICD-10-CM

## 2023-09-12 DIAGNOSIS — M069 Rheumatoid arthritis, unspecified: Secondary | ICD-10-CM | POA: Diagnosis present

## 2023-09-12 DIAGNOSIS — I5181 Takotsubo syndrome: Secondary | ICD-10-CM | POA: Diagnosis present

## 2023-09-12 DIAGNOSIS — Z7901 Long term (current) use of anticoagulants: Secondary | ICD-10-CM

## 2023-09-12 DIAGNOSIS — M3500 Sicca syndrome, unspecified: Secondary | ICD-10-CM | POA: Diagnosis present

## 2023-09-12 DIAGNOSIS — Z91040 Latex allergy status: Secondary | ICD-10-CM

## 2023-09-12 DIAGNOSIS — Z955 Presence of coronary angioplasty implant and graft: Secondary | ICD-10-CM

## 2023-09-12 DIAGNOSIS — Z79899 Other long term (current) drug therapy: Secondary | ICD-10-CM | POA: Diagnosis not present

## 2023-09-12 DIAGNOSIS — I63411 Cerebral infarction due to embolism of right middle cerebral artery: Principal | ICD-10-CM | POA: Diagnosis present

## 2023-09-12 DIAGNOSIS — G9389 Other specified disorders of brain: Secondary | ICD-10-CM | POA: Diagnosis present

## 2023-09-12 DIAGNOSIS — Z833 Family history of diabetes mellitus: Secondary | ICD-10-CM

## 2023-09-12 DIAGNOSIS — K219 Gastro-esophageal reflux disease without esophagitis: Secondary | ICD-10-CM | POA: Diagnosis present

## 2023-09-12 DIAGNOSIS — Z515 Encounter for palliative care: Secondary | ICD-10-CM | POA: Diagnosis not present

## 2023-09-12 DIAGNOSIS — I63511 Cerebral infarction due to unspecified occlusion or stenosis of right middle cerebral artery: Secondary | ICD-10-CM | POA: Diagnosis present

## 2023-09-12 DIAGNOSIS — Z8249 Family history of ischemic heart disease and other diseases of the circulatory system: Secondary | ICD-10-CM

## 2023-09-12 DIAGNOSIS — G8194 Hemiplegia, unspecified affecting left nondominant side: Secondary | ICD-10-CM | POA: Diagnosis present

## 2023-09-12 DIAGNOSIS — F419 Anxiety disorder, unspecified: Secondary | ICD-10-CM | POA: Diagnosis present

## 2023-09-12 DIAGNOSIS — E119 Type 2 diabetes mellitus without complications: Secondary | ICD-10-CM

## 2023-09-12 DIAGNOSIS — R41840 Attention and concentration deficit: Secondary | ICD-10-CM | POA: Diagnosis present

## 2023-09-12 DIAGNOSIS — W050XXA Fall from non-moving wheelchair, initial encounter: Secondary | ICD-10-CM | POA: Diagnosis present

## 2023-09-12 DIAGNOSIS — G510 Bell's palsy: Secondary | ICD-10-CM | POA: Diagnosis present

## 2023-09-12 DIAGNOSIS — I152 Hypertension secondary to endocrine disorders: Secondary | ICD-10-CM | POA: Diagnosis present

## 2023-09-12 DIAGNOSIS — E1151 Type 2 diabetes mellitus with diabetic peripheral angiopathy without gangrene: Secondary | ICD-10-CM | POA: Diagnosis present

## 2023-09-12 DIAGNOSIS — Z7189 Other specified counseling: Secondary | ICD-10-CM | POA: Diagnosis not present

## 2023-09-12 DIAGNOSIS — Z888 Allergy status to other drugs, medicaments and biological substances status: Secondary | ICD-10-CM

## 2023-09-12 DIAGNOSIS — E1169 Type 2 diabetes mellitus with other specified complication: Secondary | ICD-10-CM | POA: Diagnosis present

## 2023-09-12 DIAGNOSIS — Z9109 Other allergy status, other than to drugs and biological substances: Secondary | ICD-10-CM

## 2023-09-12 DIAGNOSIS — Z950 Presence of cardiac pacemaker: Secondary | ICD-10-CM

## 2023-09-12 DIAGNOSIS — I252 Old myocardial infarction: Secondary | ICD-10-CM

## 2023-09-12 DIAGNOSIS — R4701 Aphasia: Secondary | ICD-10-CM | POA: Diagnosis present

## 2023-09-12 HISTORY — PX: RADIOLOGY WITH ANESTHESIA: SHX6223

## 2023-09-12 LAB — I-STAT CHEM 8, ED
BUN: 10 mg/dL (ref 8–23)
Calcium, Ion: 1.14 mmol/L — ABNORMAL LOW (ref 1.15–1.40)
Chloride: 108 mmol/L (ref 98–111)
Creatinine, Ser: 0.9 mg/dL (ref 0.44–1.00)
Glucose, Bld: 97 mg/dL (ref 70–99)
HCT: 41 % (ref 36.0–46.0)
Hemoglobin: 13.9 g/dL (ref 12.0–15.0)
Potassium: 3.9 mmol/L (ref 3.5–5.1)
Sodium: 139 mmol/L (ref 135–145)
TCO2: 23 mmol/L (ref 22–32)

## 2023-09-12 LAB — COMPREHENSIVE METABOLIC PANEL
ALT: 7 U/L (ref 0–44)
AST: 18 U/L (ref 15–41)
Albumin: 2.8 g/dL — ABNORMAL LOW (ref 3.5–5.0)
Alkaline Phosphatase: 71 U/L (ref 38–126)
Anion gap: 8 (ref 5–15)
BUN: 9 mg/dL (ref 8–23)
CO2: 23 mmol/L (ref 22–32)
Calcium: 9 mg/dL (ref 8.9–10.3)
Chloride: 107 mmol/L (ref 98–111)
Creatinine, Ser: 0.89 mg/dL (ref 0.44–1.00)
GFR, Estimated: >60 mL/min (ref >60)
Glucose, Bld: 99 mg/dL (ref 70–99)
Potassium: 3.9 mmol/L (ref 3.5–5.1)
Sodium: 138 mmol/L (ref 135–145)
Total Bilirubin: 0.7 mg/dL (ref 0.0–1.2)
Total Protein: 7.6 g/dL (ref 6.5–8.1)

## 2023-09-12 LAB — CBG MONITORING, ED
Glucose-Capillary: 91 mg/dL (ref 70–99)
Glucose-Capillary: 98 mg/dL (ref 70–99)

## 2023-09-12 LAB — HIV ANTIBODY (ROUTINE TESTING W REFLEX): HIV Screen 4th Generation wRfx: NONREACTIVE

## 2023-09-12 LAB — CBC WITH DIFFERENTIAL/PLATELET
Abs Immature Granulocytes: 0.01 10*3/uL (ref 0.00–0.07)
Basophils Absolute: 0.1 10*3/uL (ref 0.0–0.1)
Basophils Relative: 2 %
Eosinophils Absolute: 0.1 10*3/uL (ref 0.0–0.5)
Eosinophils Relative: 3 %
HCT: 40.7 % (ref 36.0–46.0)
Hemoglobin: 12.5 g/dL (ref 12.0–15.0)
Immature Granulocytes: 0 %
Lymphocytes Relative: 32 %
Lymphs Abs: 1.4 10*3/uL (ref 0.7–4.0)
MCH: 23.9 pg — ABNORMAL LOW (ref 26.0–34.0)
MCHC: 30.7 g/dL (ref 30.0–36.0)
MCV: 77.7 fL — ABNORMAL LOW (ref 80.0–100.0)
Monocytes Absolute: 0.4 10*3/uL (ref 0.1–1.0)
Monocytes Relative: 9 %
Neutro Abs: 2.4 10*3/uL (ref 1.7–7.7)
Neutrophils Relative %: 54 %
Platelets: 335 10*3/uL (ref 150–400)
RBC: 5.24 MIL/uL — ABNORMAL HIGH (ref 3.87–5.11)
RDW: 16.9 % — ABNORMAL HIGH (ref 11.5–15.5)
WBC: 4.4 10*3/uL (ref 4.0–10.5)
nRBC: 0 % (ref 0.0–0.2)

## 2023-09-12 LAB — HEMOGLOBIN A1C
Hgb A1c MFr Bld: 6 % — ABNORMAL HIGH (ref 4.8–5.6)
Mean Plasma Glucose: 125.5 mg/dL

## 2023-09-12 LAB — PROTIME-INR
INR: 1.1 (ref 0.8–1.2)
Prothrombin Time: 14 s (ref 11.4–15.2)

## 2023-09-12 LAB — ETHANOL: Alcohol, Ethyl (B): <10 mg/dL (ref <10)

## 2023-09-12 LAB — APTT: aPTT: 28 s (ref 24–36)

## 2023-09-12 LAB — RESP PANEL BY RT-PCR (RSV, FLU A&B, COVID)  RVPGX2
Influenza A by PCR: NEGATIVE
Influenza B by PCR: NEGATIVE
Resp Syncytial Virus by PCR: NEGATIVE
SARS Coronavirus 2 by RT PCR: NEGATIVE

## 2023-09-12 SURGERY — RADIOLOGY WITH ANESTHESIA
Anesthesia: General

## 2023-09-12 MED ORDER — APIXABAN 5 MG PO TABS
5.0000 mg | ORAL_TABLET | Freq: Two times a day (BID) | ORAL | Status: DC
Start: 2023-09-12 — End: 2023-09-13
  Administered 2023-09-13: 5 mg via ORAL
  Filled 2023-09-12: qty 1

## 2023-09-12 MED ORDER — ACETAMINOPHEN 650 MG RE SUPP: 650.0000 mg | RECTAL | Status: DC | PRN

## 2023-09-12 MED ORDER — STROKE: EARLY STAGES OF RECOVERY BOOK
Freq: Once | Status: AC
  Filled 2023-09-12: qty 1

## 2023-09-12 MED ORDER — ACETAMINOPHEN 160 MG/5ML PO SOLN: 650.0000 mg | ORAL | Status: DC | PRN

## 2023-09-12 MED ORDER — NITROGLYCERIN 1 MG/10 ML FOR IR/CATH LAB
INTRA_ARTERIAL | Status: AC
  Filled 2023-09-12: qty 10

## 2023-09-12 MED ORDER — IOHEXOL 350 MG/ML SOLN
100.0000 mL | Freq: Once | INTRAVENOUS | Status: AC | PRN
  Administered 2023-09-12: 100 mL via INTRAVENOUS

## 2023-09-12 MED ORDER — ROSUVASTATIN CALCIUM 20 MG PO TABS
40.0000 mg | ORAL_TABLET | Freq: Every day | ORAL | Status: DC
  Administered 2023-09-13 – 2023-09-17 (×5): 40 mg via ORAL
  Filled 2023-09-12 (×5): qty 2

## 2023-09-12 MED ORDER — ACETAMINOPHEN 325 MG PO TABS
650.0000 mg | ORAL_TABLET | ORAL | Status: DC | PRN
  Administered 2023-09-13: 650 mg via ORAL
  Filled 2023-09-12: qty 2

## 2023-09-12 MED ORDER — KETOROLAC TROMETHAMINE 15 MG/ML IJ SOLN
15.0000 mg | Freq: Once | INTRAMUSCULAR | Status: AC | PRN
  Administered 2023-09-12: 15 mg via INTRAVENOUS
  Filled 2023-09-12: qty 1

## 2023-09-12 MED ORDER — SENNOSIDES-DOCUSATE SODIUM 8.6-50 MG PO TABS
1.0000 | ORAL_TABLET | Freq: Every evening | ORAL | Status: DC | PRN
  Administered 2023-09-15: 1 via ORAL
  Filled 2023-09-12: qty 1

## 2023-09-12 MED ORDER — SODIUM CHLORIDE 0.9 % IV SOLN: INTRAVENOUS | Status: AC

## 2023-09-12 NOTE — Code Documentation (Signed)
 Code stroke activated on this patient to expedite CTA/P. 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 Upgraded to a Code IR at 1433. Consent given by granddaughter Colie Dawes. Pt taken to IR suite at 1437. Handoff with IR RN complete.

## 2023-09-12 NOTE — ED Notes (Signed)
 Pt transported to IR with stroke RN

## 2023-09-12 NOTE — H&P (Signed)
 History and Physical   Mercedes [name] NWG:[id number] DOB: 1956-08-22 DOA: 09/12/2023  PCP: Colene Dauphin, MD   Patient coming from: Home  Chief Complaint: Neurologic deficits, code stroke  HPI: Mercedes [name] is a 68 y.o. female with medical history significant of stroke, hypertension, hyperlipidemia, CAD status post CABG, history of Takotsubo cardiomyopathy, PAD status post bypass and bilateral AKA, atrial fibrillation, tachybradycardia syndrome status post pacemaker, diabetes, rheumatoid arthritis, Sjogren's syndrome, SLE, seizures, hypothyroidism, GERD presenting with focal neurologic deficits.  Patient presenting with left-sided weakness and facial droop as well as some difficulty speaking.  Patient was last known to be normal yesterday evening per family.  She did have a fall out of her chair yesterday but was helped back into her chair by family member prior to him leaving.  This morning patient spoke with granddaughter on the phone who noted abnormal/unintelligible speech.  Granddaughter came to see her and noted other deficits and this was when EMS was called to transport patient to the ED.  Patient noted to have left-sided arm numbness and weakness, left facial droop, dysarthria.  Patient denies fevers, chills, chest pain, shortness of breath, abdominal pain, constipation, diarrhea, nausea, vomiting.  ED Course: + To the ED notable for blood pressure in the 170s to 200s systolic, heart rate in the 50s to 60s.  Lab workup included CMP with albumin  2.8.  CBC within normal limits.  PT, PTT, INR within normal limits.  Ethanol level negative.  Urinalysis and UDS pending.  Respiratory panel for flu COVID and RSV pending.  Lipid panel and A1c pending.  HIV pending.  Chest x-ray showed no acute abnormality.  CT head showed no acute normality but did show expected encephalomalacia following prior stroke a year ago.  CTA of the head and neck showed occlusion of the right M2, occlusion of the  right A2, infarct core at the right periventricular white matter in the right MCA territory 5 mL, 50% right ICA stenosis and 70% left ICA stenosis.  Neurology saw patient as a code stroke in the ED.  Plan was for thrombectomy after clarifying patient's symptoms and baseline with family.  Consent was obtained from family for thrombectomy however when taken for the procedure patient declined.  Neurology had discussion with multiple providers and multiple family members and no consensus could be reached about overriding her refusal, see neurology note for details.  Ultimately patient was reevaluated and reconfirmed her wish to decline procedure and understanding that if she declines the procedure she may have permanent deficits and may lose her independence and need to live in a nursing facility or other long-term care facility.  Review of Systems: As per HPI otherwise all other systems reviewed and are negative.  Past Medical History:  Diagnosis Date   Abnormal LFTs    a. in the past when on Lipitor    Acute ischemic right ICA stroke (HCC) 08/28/2022   Anemia    as a young woman   Anxiety    Arnold-Chiari malformation, type I (HCC)    MRI brain 02/2010   Atrial flutter with rapid ventricular response vs. SVT 10/27/2014   CAD (coronary artery disease)    a. NSTEMI 07/2009: LHC - D2 40%, LAD irreg., EF 50% with apical AK (tako-tsubo CM);  b. Inf STEMI (04/2013): Tx Promus DES to CFX;  c. 08/2013 Lexi CL: No ischemia, dist ant/ap/inf-ap infarct, EF  d. 06/2016 NSTEMI with LM disease: s/p CABG on 07/17/2016 w/ LIMA-LAD, SVG-OM, and SVG-dRCA.  DEPRESSION    Dizziness    ? 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 Alva Jewels   Sjogren's syndrome (HCC) 06/02/2013   pt denies this (12/01/13)   SLE (systemic lupus erythematosus) (HCC) dx 05/2013   follows with rheum Alva Jewels)   Stroke [facility])    March 2023, weakness in right arm is improving   Tachy-brady syndrome (HCC)    a. 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   f/u echo 09/2009: EF 50-55%, mild LVH, mod diast dysfxn, mild apical HK   Wears dentures    Wears glasses     Past Surgical History:  Procedure Laterality Date   ABDOMINAL AORTAGRAM N/A 11/24/2013   Procedure: ABDOMINAL AORTAGRAM WITH BILATERAL RUNOFF WITH POSSIBLE INTERVENTION;  Surgeon: Dannis Dy, MD;  Location: [facility] CATH LAB;  Service: Cardiovascular;  Laterality: N/A;   ABDOMINAL AORTOGRAM W/LOWER EXTREMITY N/A 04/25/2019   Procedure: ABDOMINAL AORTOGRAM W/LOWER EXTREMITY;  Surgeon: Richrd Char, MD;  Location: [facility] INVASIVE CV LAB;  Service: Cardiovascular;  Laterality: N/A;   ABDOMINAL AORTOGRAM W/LOWER EXTREMITY Bilateral 05/15/2019   Procedure: ABDOMINAL AORTOGRAM W/LOWER EXTREMITY;  Surgeon: Margherita Shell, MD;  Location: [facility] INVASIVE CV LAB;  Service: Cardiovascular;  Laterality: Bilateral;   ABDOMINAL AORTOGRAM W/LOWER EXTREMITY N/A 08/23/2020   Procedure: ABDOMINAL AORTOGRAM W/LOWER EXTREMITY;  Surgeon: Adine Hoof, MD;  Location: [facility] INVASIVE CV LAB;  Service: Cardiovascular;  Laterality: N/A;   ABDOMINAL AORTOGRAM W/LOWER EXTREMITY N/A 10/29/2020   Procedure: ABDOMINAL AORTOGRAM W/LOWER EXTREMITY;  Surgeon: Richrd Char, MD;  Location: [facility] INVASIVE CV LAB;  Service: Cardiovascular;  Laterality: N/A;   AMPUTATION Left 12/02/2013   Procedure: LEFT 1ST  TOE AMPUTATION;  Surgeon: Richrd Char, MD;  Location: [facility] OR;  Service: Vascular;  Laterality: Left;   AMPUTATION Left 07/16/2019   Procedure: LEFT TRANSMETATARSAL AMPUTATION;  Surgeon: Timothy Ford, MD;  Location: [facility] OR;  Service: Orthopedics;  Laterality: Left;   AMPUTATION Left 10/01/2019   Procedure: LEFT AMPUTATION ABOVE KNEE;  Surgeon: Richrd Char, MD;  Location: [facility] OR;  Service: Vascular;  Laterality: Left;   AMPUTATION Right 08/31/2020   Procedure: Right first toe amputation;  Surgeon: Richrd Char, MD;  Location: [facility] OR;  Service: Vascular;  Laterality: Right;   AMPUTATION Right 11/23/2020   Procedure: RIGHT AMPUTATION ABOVE KNEE;  Surgeon: Richrd Char, MD;  Location: [facility] OR;  Service: Vascular;  Laterality: Right;   AMPUTATION Left 03/14/2022   Procedure: REVISION LEFT ABOVE KNEE AMPUTATION;  Surgeon: Adine Hoof, MD;  Location: [facility] OR;  Service: Vascular;  Laterality: Left;   APPLICATION OF WOUND VAC Left 11/03/2019   Procedure: Application Of Wound Vac, left lower extremity;  Surgeon: Richrd Char, MD;  Location: [facility] OR;  Service: Vascular;  Laterality: Left;   CARDIAC CATHETERIZATION N/A 07/12/2016   Procedure:  Left Heart Cath and Coronary Angiography;  Surgeon: Millicent Ally, MD;  Location: [facility] INVASIVE CV LAB;  Service: Cardiovascular;  Laterality: N/A;   CARDIAC CATHETERIZATION N/A 07/14/2016   Procedure: Intravascular Ultrasound/IVUS;  Surgeon: Sammy Crisp, MD;  Location: [facility] INVASIVE CV LAB;  Service: Cardiovascular;  Laterality: N/A;   COLONOSCOPY     CORONARY ANGIOPLASTY  04/2013   CORONARY ARTERY BYPASS GRAFT N/A 07/17/2016   Procedure: CORONARY ARTERY BYPASS GRAFTING (CABG)x3 using right greater saphenous vein harvested endoscopiclly and left internal mammary artery;  Surgeon: Heriberto London, MD;  Location: [facility] OR;  Service: Open Heart Surgery;  Laterality: N/A;   ENDARTERECTOMY FEMORAL Left 05/12/2019   Procedure: LEFT Femoral  Endarterectomy;  Surgeon: Richrd Char, MD;  Location: [facility] OR;  Service: Vascular;  Laterality: Left;   FEMORAL-POPLITEAL BYPASS GRAFT Left 12/02/2013   Procedure:  LEFT FEMORAL-BELOW KNEE POPLITEAL ARTERY BYPASS GRAFT;  Surgeon: Richrd Char, MD;  Location: [facility] OR;  Service: Vascular;  Laterality: Left;   FEMORAL-POPLITEAL BYPASS GRAFT Left 09/25/2019   Procedure: THROMBECTOMY and PROPATEN BYPASS  FEMORAL TO BELOW KNEE POPLITEAL ARTERY BYPASS GRAFT LEFT LEG;  Surgeon: Mayo Speck, MD;  Location: [facility] OR;  Service: Vascular;  Laterality: Left;   FEMORAL-POPLITEAL BYPASS GRAFT Right 08/24/2020   Procedure: RIGHT FEMORAL-BELOW KNEE POPLITEAL ARTERY BYPASS USING GORE PROPATEN GRAFT;  Surgeon: Adine Hoof, MD;  Location: [facility] OR;  Service: Vascular;  Laterality: Right;   FEMORAL-POPLITEAL BYPASS GRAFT Right 11/01/2020   Procedure: Thrombectomy and Revision of Right Femoral to below knee Bypass with Interpostional graft to Tibial / peroneal Trunk and Endarterectomy Tibial Bartholome Boron Trunk.;  Surgeon: Richrd Char, MD;  Location: [facility] OR;  Service: Vascular;  Laterality: Right;   INTRAOPERATIVE ARTERIOGRAM Right 11/01/2020   Procedure: ANTEGRADE RIGHT LOWER EXTREMITY INTRA OPERATIVE ARTERIOGRAM;  Surgeon: Richrd Char, MD;  Location: [facility] OR;  Service: Vascular;  Laterality: Right;   IR CT HEAD LTD  08/28/2022   IR PERCUTANEOUS ART THROMBECTOMY/INFUSION INTRACRANIAL INC DIAG ANGIO  08/28/2022   IR US  GUIDE VASC ACCESS RIGHT  08/28/2022   LEFT HEART CATHETERIZATION WITH CORONARY ANGIOGRAM N/A 05/19/2013   Procedure: LEFT HEART CATHETERIZATION WITH CORONARY ANGIOGRAM;  Surgeon: Arlander Bellman, MD;  Location: [facility] CATH LAB;  Service: Cardiovascular;  Laterality: N/A;   LEFT HEART CATHETERIZATION WITH CORONARY ANGIOGRAM N/A 10/28/2014   Procedure: LEFT HEART CATHETERIZATION WITH CORONARY ANGIOGRAM;  Surgeon: Wenona Hamilton, MD;  Location: [facility] CATH LAB;  Service: Cardiovascular;  Laterality: N/A;    LOWER EXTREMITY ANGIOGRAM Left 05/12/2019   Procedure: Lower Extremity Angiogram;  Surgeon: Richrd Char, MD;  Location: [facility] OR;  Service: Vascular;  Laterality: Left;   PATCH ANGIOPLASTY Left 05/12/2019   Procedure: Left Femoral Patch Angioplasty USING CADAVERIC SAPHENOUS VEIN;  Surgeon: Richrd Char, MD;  Location: [facility] OR;  Service: Vascular;  Laterality: Left;   PERCUTANEOUS CORONARY STENT INTERVENTION (PCI-S)  05/19/2013   Procedure: PERCUTANEOUS CORONARY STENT INTERVENTION (PCI-S);  Surgeon: Arlander Bellman, MD;  Location: [facility] CATH LAB;  Service: Cardiovascular;;   PERIPHERAL VASCULAR INTERVENTION Right 10/29/2020   Procedure: PERIPHERAL VASCULAR INTERVENTION;  Surgeon: Richrd Char, MD;  Location: [facility] INVASIVE CV LAB;  Service: Cardiovascular;  Laterality: Right;  common iliac   PERMANENT PACEMAKER INSERTION N/A 11/04/2014   STJ Leadless pacemaker implanted by Dr Nunzio Belch for tachy/brady   RADIOLOGY WITH ANESTHESIA N/A 08/28/2022   Procedure: RADIOLOGY WITH ANESTHESIA;  Surgeon: Radiologist, Medication, MD;  Location: [facility] OR;  Service: Radiology;  Laterality: N/A;   TEE WITHOUT CARDIOVERSION N/A 07/17/2016   Procedure: TRANSESOPHAGEAL ECHOCARDIOGRAM (TEE);  Surgeon: Heriberto London, MD;  Location: [facility] OR;  Service: Open Heart Surgery;  Laterality: N/A;   THROMBECTOMY FEMORAL ARTERY Left 05/12/2019   Procedure: Left Ilio-Femoral Artery and Femoral-popliteal Graft Thrombectomy;  Surgeon: Richrd Char, MD;  Location: [facility] OR;  Service: Vascular;  Laterality: Left;   THROMBECTOMY OF BYPASS GRAFT FEMORAL- POPLITEAL ARTERY Right 11/01/2020   Procedure: POSSIBLE THROMBOLYSIS VERSUS OPEN THROMBECTOMY AND REVSION OF RIGHT FEMORAL-POPLITEAL ARTERY BYPASS;  Surgeon: Richrd Char, MD;  Location: [facility] OR;  Service: Vascular;  Laterality: Right;   TUBAL LIGATION     VISCERAL ANGIOGRAPHY N/A 04/25/2019   Procedure: MESENTRIC  ANGIOGRAPHY;  Surgeon: Richrd Char, MD;  Location: [facility] INVASIVE CV LAB;   Service: Cardiovascular;  Laterality: N/A;   WOUND DEBRIDEMENT Left 11/03/2019   Procedure: Debridement Wound of left above the knee amputation;  Surgeon: Richrd Char, MD;  Location: [facility] OR;  Service: Vascular;  Laterality: Left;    Social History  reports that she quit smoking about 6 years ago. Her smoking use included cigarettes. She has never used smokeless tobacco. She reports that she does not currently use drugs after having used the following drugs: Marijuana and Other-see comments. She reports that she does not drink alcohol.  Allergies  Allergen Reactions   Brilinta  [Ticagrelor ] Anaphylaxis, Shortness Of Breath and Other (See Comments)    Also, chest tightness   Latex Rash   Tape Rash    Family History  Problem Relation Age of Onset   Arthritis Mother    Emphysema Mother    Arthritis Father    COPD Father    Heart disease Father    Diabetes Paternal Aunt        paternal Aunts   Heart disease Paternal Aunt    Thyroid  disease Neg Hx   Reviewed on edition  Prior to Admission medications   Medication Sig Start Date End Date Taking? Authorizing Provider  acetaminophen  (TYLENOL ) 500 MG tablet Take by mouth.    [provider]  ELIQUIS  5 MG TABS tablet TAKE 1 TABLET BY MOUTH TWICE  DAILY 04/20/22   Colene Dauphin, MD  mupirocin  ointment (BACTROBAN ) 2 % Apply 1 Application topically 2 (two) times daily. 07/31/23   Colene Dauphin, MD  NIFEdipine  (ADALAT  CC) 30 MG 24 hr tablet Take 30 mg by mouth daily.    [provider]  nitroGLYCERIN  (NITROSTAT ) 0.4 MG SL tablet Place 1 tablet (0.4 mg total) under the tongue every 5 (five) minutes as needed for chest pain. 12/29/21   Colene Dauphin, MD  polyethylene glycol (MIRALAX  / GLYCOLAX ) 17 g packet Take 17 g by mouth daily. 09/19/22   Audrene Lease, NP  rosuvastatin  (CRESTOR ) 40 MG tablet Take 1 tablet (40 mg total) by mouth daily. Patient not taking: Reported on 07/31/2023 06/29/22   Goodrich, Callie E, PA-C   senna-docusate (SENOKOT-S) 8.6-50 MG tablet Take 1 tablet by mouth at bedtime as needed for mild constipation. 09/18/22   Audrene Lease, NP    Physical Exam: Vitals:   09/12/23 1217 09/12/23 1241 09/12/23 1330 09/12/23 1500  BP:  (!) 200/87 (!) 170/99 (!) 155/99  Pulse:  60 68 61  Resp:  (!) 25 14 12   Temp: 98.5 F (36.9 C)     TempSrc: Oral     SpO2:  100% 100% 97%  Weight:  Height:        Physical Exam Constitutional:      General: She is not in acute distress.    Appearance: Normal appearance.  HENT:     Head: Normocephalic and atraumatic.     Mouth/Throat:     Mouth: Mucous membranes are moist.     Pharynx: Oropharynx is clear.  Eyes:     Extraocular Movements: Extraocular movements intact.     Pupils: Pupils are equal, round, and reactive to light.  Cardiovascular:     Rate and Rhythm: Normal rate and regular rhythm.     Pulses: Normal pulses.     Heart sounds: Normal heart sounds.  Pulmonary:     Effort: Pulmonary effort is normal. No respiratory distress.     Breath sounds: Normal breath sounds.  Abdominal:     General: Bowel sounds are normal. There is no distension.     Palpations: Abdomen is soft.     Tenderness: There is no abdominal tenderness.  Musculoskeletal:        General: No swelling or deformity.  Skin:    General: Skin is warm and dry.  Neurological:     Comments: Mental Status: Patient is awake, alert Aphasic vs Dysarthria Cranial Nerves: II: Pupils equal, round   III,IV, VI: EOMI   V: Facial sensation is symmetric  VII: Left facial droop VIII: hearing is intact to voice XI: Shoulder shrug is decreased on Left. XII: Difficulty with sticking out tongue Motor: Good effort thorughout, at Least 5/5 bilateral RUE, 3-4/5 LUE, 5/5 bilateral lower extremitiy  Sensory: Sensation is grossly intact    Labs on Admission: I have personally reviewed following labs and imaging studies  CBC: Recent Labs  Lab 09/12/23 1225 09/12/23 1232   WBC 4.4  --   NEUTROABS 2.4  --   HGB 12.5 13.9  HCT 40.7 41.0  MCV 77.7*  --   PLT 335  --     Basic Metabolic Panel: Recent Labs  Lab 09/12/23 1225 09/12/23 1232  NA 138 139  K 3.9 3.9  CL 107 108  CO2 23  --   GLUCOSE 99 97  BUN 9 10  CREATININE 0.89 0.90  CALCIUM  9.0  --     GFR: Estimated Creatinine Clearance: 38.2 mL/min (by C-G formula based on SCr of 0.9 mg/dL).  Liver Function Tests: Recent Labs  Lab 09/12/23 1225  AST 18  ALT 7  ALKPHOS 71  BILITOT 0.7  PROT 7.6  ALBUMIN  2.8*    Urine analysis:    Component Value Date/Time   COLORURINE YELLOW 11/21/2021 2204   APPEARANCEUR CLEAR 11/21/2021 2204   LABSPEC 1.016 11/21/2021 2204   PHURINE 6.0 11/21/2021 2204   GLUCOSEU NEGATIVE 11/21/2021 2204   HGBUR NEGATIVE 11/21/2021 2204   BILIRUBINUR NEGATIVE 11/21/2021 2204   KETONESUR NEGATIVE 11/21/2021 2204   PROTEINUR NEGATIVE 11/21/2021 2204   UROBILINOGEN 1.0 11/06/2014 1508   NITRITE NEGATIVE 11/21/2021 2204   LEUKOCYTESUR TRACE (A) 11/21/2021 2204    Radiological Exams on Admission: DG Chest Portable 1 View Result Date: 09/12/2023 CLINICAL DATA:  Stroke workup slurred speech EXAM: PORTABLE CHEST 1 VIEW COMPARISON:  01/31/2019 FINDINGS: Post sternotomy changes. No acute airspace disease or effusion. Normal cardiomediastinal silhouette with aortic atherosclerosis. Leadless pacemaker is noted. IMPRESSION: No active disease. Electronically Signed   By: Esmeralda Hedge M.D.   On: 09/12/2023 15:41   CT ANGIO HEAD NECK W WO CM W PERF (CODE STROKE) Result Date: 09/12/2023 CLINICAL DATA:  Aphasia, stroke suspected EXAM: CT ANGIOGRAPHY HEAD AND NECK CT PERFUSION BRAIN TECHNIQUE: Multidetector CT imaging of the head and neck was performed using the standard protocol during bolus administration of intravenous contrast. Multiplanar CT image reconstructions and MIPs were obtained to evaluate the vascular anatomy. Carotid stenosis measurements (when applicable) are  obtained utilizing NASCET criteria, using the distal internal carotid diameter as the denominator. Multiphase CT imaging of the brain was performed following IV bolus contrast injection. Subsequent parametric perfusion maps were calculated using RAPID software. RADIATION DOSE REDUCTION: This exam was performed according to the departmental dose-optimization program which includes automated exposure control, adjustment of the mA and/or kV according to patient size and/or use of iterative reconstruction technique. CONTRAST:  100 mL Omnipaque  350 COMPARISON:  CTA 08/28/2022, CT head 09/12/2023 FINDINGS: CT HEAD FINDINGS For noncontrast findings, please see same day CT head. CTA NECK FINDINGS Aortic arch: The arch is not included in the field of view. Right carotid system: 50% stenosis in the proximal right ICA, secondary to noncalcified plaque (series 8, image 269). Moderate stenosis at the origin of the right ECA. No evidence of dissection. Previously noted distal ICA occlusion is no longer seen. Left carotid system: 70% stenosis in the proximal left ICA (series 8, image 421). No significant stenosis at the origin of the ECA. No evidence of dissection. Vertebral arteries: No evidence of dissection, occlusion, or hemodynamically significant stenosis (greater than 50%). Skeleton: No acute osseous abnormality. Degenerative changes in the cervical spine. Other neck: 4 mm hypoenhancing nodule in the left thyroid , for which no follow-up is currently indicated. (Reference: J Am Coll Radiol. 2015 Feb;12(2): 143-50). Fluid in the esophagus, which is patulous, increasing the risk for aspiration. Upper chest: No focal pulmonary opacity or pleural effusion. Emphysema. Review of the MIP images confirms the above findings CTA HEAD FINDINGS Anterior circulation: Both internal carotid arteries are patent to the termini, with calcifications but without significant stenosis. A1 segments patent. Normal anterior communicating artery.  Occlusion of the right A2 just after its origin (series 8, image 146 and 140), with distal reconstitution (series 8, image 112). The left A2 and distal left ACA branches are patent. Mild stenosis in the proximal right M1. Occlusion of the proximal right M2 insular branch (series 8, image 138), with reconstitution in its M3 branches (series 8, image 133). The more lateral right M3 is subsequently reoccluded distally (series 8, image 124), with additional multifocal occlusion of its downstream branches (series 8, image 123 and 121). While other downstream branches of the more medial right M3 are occluded more superiorly (series 8, image 106). The anterior right M2 and is branches are patent. The left M1 and left MCA branches are patent, without significant stenosis. Posterior circulation: Vertebral arteries patent to the vertebrobasilar junction without significant stenosis. Posterior inferior cerebellar arteries patent proximally. Basilar patent to its distal aspect without significant stenosis. Superior cerebellar arteries patent proximally. Patent right P1. Fetal origin of the left PCA from the left posterior communicating artery. The right posterior communicating artery is also patent. PCAs perfused to their distal aspects without significant stenosis. Venous sinuses: As permitted by contrast timing, patent. Anatomic variants: Fetal left PCA. No evidence of aneurysm or vascular malformation. Review of the MIP images confirms the above findings CT Brain Perfusion Findings: ASPECTS: 10 CBF (<30%) Volume: 5mL Perfusion (Tmax>6.0s) volume: Mismatch Volume: Infarction Location:Infarct core in the right posterior frontal periventricular white matter, penumbra throughout the posterior right MCA territory. Code stroke imaging results were communicated  on 09/12/2023 at 2:27 pm to provider Dr. Bonnita Buttner via telephone, who verbally acknowledged these results. IMPRESSION: 1. Occlusion of the proximal right M2 insular  branch, with reconstitution in its M3 branches and multifocal more downstream occlusion and reconstitution. 2. Occlusion of the right A2 just after its origin, with distal reconstitution. 3. Infarct core in the right posterior frontal periventricular white matter, with penumbra throughout the posterior right MCA territory, measuring 5 mL. 4. 50% stenosis in the proximal right ICA, secondary to noncalcified plaque. 5. 70% stenosis in the proximal left ICA. 6. Fluid in the esophagus, which is patulous, increasing the risk for aspiration. Right MCA occlusion was discussed on 09/12/2023 at 2:27 pm with provider Dr. Bonnita Buttner via telephone, who verbally acknowledged these results. Right ACA occlusion was communicated on 09/12/2023 at 2:48 pm to provider Dr. Bonnita Buttner via secure text paging. Electronically Signed   By: Zoila Hines M.D.   On: 09/12/2023 14:49   CT HEAD CODE STROKE WO CONTRAST` Result Date: 09/12/2023 CLINICAL DATA:  Code stroke.  Aphasia EXAM: CT HEAD WITHOUT CONTRAST TECHNIQUE: Contiguous axial images were obtained from the base of the skull through the vertex without intravenous contrast. RADIATION DOSE REDUCTION: This exam was performed according to the departmental dose-optimization program which includes automated exposure control, adjustment of the mA and/or kV according to patient size and/or use of iterative reconstruction technique. COMPARISON:  09/07/2022 CT head FINDINGS: Brain: No evidence of acute infarction, hemorrhage, mass, mass effect, or midline shift. No hydrocephalus or extra-axial collection. Encephalomalacia in the right lentiform nucleus and external capsule, consistent with sequela of the acute infarct noted in January 2024, with widening of the right sylvian fissure. Basal ganglia calcifications. Vascular: No hyperdense vessel. Skull: Negative for fracture or focal lesion. Sinuses/Orbits: Opacification of the right sphenoid sinus, with osseous thickening of the walls. Additional milder  mucosal thickening in the maxillary sinuses. No acute finding in the orbits. Other: The mastoid air cells are well aerated. ASPECTS [facility] Stroke Program Early CT Score) - Ganglionic level infarction (caudate, lentiform nuclei, internal capsule, insula, M1-M3 cortex): 7 - Supraganglionic infarction (M4-M6 cortex): 3 Total score (0-10 with 10 being normal): 10 IMPRESSION: 1. No acute intracranial process. ASPECTS is 10. 2. 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 3. Chronic right sphenoid sinusitis. Imaging results were communicated on 09/12/2023 at 12:42 pm to provider Dr. Bonnita Buttner via secure text paging. Electronically Signed   By: Zoila Hines M.D.   On: 09/12/2023 12:42   EKG: Independently reviewed.  Sinus rhythm at 56 bpm.  Right bundle branch block.  QRS 142.  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  Also some speech abnormality. > CTA showed occlusion of the right M2 and A2 with an  infarct core 5 mL.  Initial plan for thrombectomy was aborted after patient refusal and further discussion with family as per HPI and neurology note. - Appreciate neurology recommendations and assistance - Allow for permissive HTN i (systolic < 220 and diastolic <  120)  - Continue home Eliquis  - Continue home Statin  - Echocardiogram  - A1C  - Lipid panel  - Tele monitoring  - SLP eval - PT/OT  Hypertension - Holding nifedipine  in the setting of permissive hypertension as above  Hyperlipidemia - Continue home Statin  CAD > Status post CABG - Continue home Eliquis , statin  PAD > Status post bypass and subsequent bilateral AKA. - Continue home Eliquis , rosuvastatin   Atrial fibrillation - Continue home Eliquis   Tachybradycardia syndrome - Status post pacemaker  History of seizures History of hyperthyroidism History of RA, Sjogren's syndrome, SLE - Noted, not currently on medications for these  DVT prophylaxis: Eliquis  Code Status:   Full Family Communication:  None on admission. Detailed discussion held by phone with family by neurology team per their note.   Disposition Plan:   Patient is from:  Home  Anticipated DC to:  Pending clinical course  Anticipated DC date:  2 to 4 days  Anticipated DC barriers: None  Consults called:  Neurology Admission status:  Inpatient, telemetry  Severity of Illness: The appropriate patient status for this patient is INPATIENT. Inpatient status is judged to be reasonable and necessary in order to provide the required intensity of service to ensure the patient's safety. The patient's presenting symptoms, physical exam findings, and initial radiographic and laboratory data in the context of their chronic comorbidities is felt to place them at high risk for further clinical deterioration. Furthermore, it is not anticipated that the patient will be medically stable for discharge from the hospital within 2 midnights of admission.   * I certify that at the point of admission it is my clinical judgment that the patient will require inpatient hospital care spanning beyond 2 midnights from the point of admission due to high intensity of service, high risk for further deterioration and high frequency of  surveillance required.Johnetta Nab MD [facility]  How to contact the [name] Attending or Consulting provider 7A - 7P or covering provider during after hours 7P -7A, for this patient?   Check the care team in [facility] and look for a) attending/consulting [facility] provider listed and b) the [facility] team listed Log into [email or web address] and use [place]'s universal password to access. If you do not have the password, please contact the hospital operator. Locate the [facility] provider you are looking for under [facility] and page to a number that you can be directly reached. If you still have difficulty reaching the provider, please page the [facility] (Director on Call) for the Hospitalists listed on amion for assistance.  09/12/2023, 4:04 PM

## 2023-09-12 NOTE — ED Provider Notes (Addendum)
 [place] EMERGENCY DEPARTMENT AT [facility] Provider Note   CSN: [id number] Arrival date & time: 09/12/23  1207  An emergency department physician performed an initial assessment on this suspected stroke patient at 48.  History  Chief Complaint  Patient presents with   Cerebrovascular Accident    Mercedes [name] is a 68 y.o. female.  Patient comes to the emergency department by EMS.  They report that patient has increased weakness today and difficulty speaking.  EMS reports that patient has increased left-sided weakness and increased facial weakness.  Patient had a left subcortical infarct March 2023.  Patient had a right hemispheric infarct January 2024.  EMS was told that patient was normal yesterday at 5:00.  Patient reports she feels weaker on her left side and that she is having more difficulty speaking than usual.  Patient complains of a headache.  Patient has a past medical history of previous CVAs coronary artery disease.  Patient has had a CABG in the past.  She has had bilateral above-the-knee amputations.  The history is provided by the patient. No language interpreter was used.  Cerebrovascular Accident       Home Medications Prior to Admission medications   Medication Sig Start Date End Date Taking? Authorizing Provider  acetaminophen  (TYLENOL ) 500 MG tablet Take by mouth.    [provider]  ELIQUIS  5 MG TABS tablet TAKE 1 TABLET BY MOUTH TWICE  DAILY 04/20/22   Colene Dauphin, MD  mupirocin  ointment (BACTROBAN ) 2 % Apply 1 Application topically 2 (two) times daily. 07/31/23   Colene Dauphin, MD  NIFEdipine  (ADALAT  CC) 30 MG 24 hr tablet Take 30 mg by mouth daily.    [provider]  nitroGLYCERIN  (NITROSTAT ) 0.4 MG SL tablet Place 1 tablet (0.4 mg total) under the tongue every 5 (five) minutes as needed for chest pain. 12/29/21   Colene Dauphin, MD  polyethylene glycol (MIRALAX  / GLYCOLAX ) 17 g packet Take 17 g by mouth daily. 09/19/22    Audrene Lease, NP  rosuvastatin  (CRESTOR ) 40 MG tablet Take 1 tablet (40 mg total) by mouth daily. Patient not taking: Reported on 07/31/2023 06/29/22   Goodrich, Callie E, PA-C  senna-docusate (SENOKOT-S) 8.6-50 MG tablet Take 1 tablet by mouth at bedtime as needed for mild constipation. 09/18/22   Lehner, Erin C, NP      Allergies    Brilinta  [ticagrelor ], Latex, and Tape    Review of Systems   Review of Systems  All other systems reviewed and are negative.   Physical Exam Updated Vital Signs BP (!) 200/87   Pulse 60   Temp 98.5 F (36.9 C) (Oral)   Resp (!) 25   Ht 5\' 5"  (1.651 m)   Wt 41 kg   SpO2 100%   BMI 15.04 kg/m  Physical Exam Vitals and nursing note reviewed.  Constitutional:      Appearance: She is well-developed.  HENT:     Head: Normocephalic.     Comments: Left-sided facial droop    Right Ear: External ear normal.     Left Ear: External ear normal.     Nose: Nose normal.  Eyes:     Extraocular Movements: Extraocular movements intact.     Pupils: Pupils are equal, round, and reactive to light.  Cardiovascular:     Rate and Rhythm: Normal rate.     Pulses: Normal pulses.  Pulmonary:     Effort: Pulmonary effort is normal.  Abdominal:  General: There is no distension.  Musculoskeletal:     Cervical back: Normal range of motion.     Comments: Bilateral above-the-knee amputations, full range of motion right arm and right hand.  Decreased use of left arm and left hand.  Full range of motion of hand  Skin:    General: Skin is warm.  Neurological:     General: No focal deficit present.     Mental Status: She is alert and oriented to person, place, and time.  Psychiatric:        Mood and Affect: Mood normal.     ED Results / Procedures / Treatments   Labs (all labs ordered are listed, but only abnormal results are displayed) Labs Reviewed  CBC WITH DIFFERENTIAL/PLATELET - Abnormal; Notable for the following components:      Result Value   RBC  5.24 (*)    MCV 77.7 (*)    MCH 23.9 (*)    RDW 16.9 (*)    All other components within normal limits  COMPREHENSIVE METABOLIC PANEL - Abnormal; Notable for the following components:   Albumin  2.8 (*)    All other components within normal limits  I-STAT CHEM 8, ED - Abnormal; Notable for the following components:   Calcium , Ion 1.14 (*)    All other components within normal limits  PROTIME-INR  APTT  URINALYSIS, ROUTINE W REFLEX MICROSCOPIC  ETHANOL  RAPID URINE DRUG SCREEN, HOSP PERFORMED  CBG MONITORING, ED  CBG MONITORING, ED    EKG None  Radiology CT HEAD CODE STROKE WO CONTRAST` Result Date: 09/12/2023 CLINICAL DATA:  Code stroke.  Aphasia EXAM: CT HEAD WITHOUT CONTRAST TECHNIQUE: Contiguous axial images were obtained from the base of the skull through the vertex without intravenous contrast. RADIATION DOSE REDUCTION: This exam was performed according to the departmental dose-optimization program which includes automated exposure control, adjustment of the mA and/or kV according to patient size and/or use of iterative reconstruction technique. COMPARISON:  09/07/2022 CT head FINDINGS: Brain: No evidence of acute infarction, hemorrhage, mass, mass effect, or midline shift. No hydrocephalus or extra-axial collection. Encephalomalacia in the right lentiform nucleus and external capsule, consistent with sequela of the acute infarct noted in January 2024, with widening of the right sylvian fissure. Basal ganglia calcifications. Vascular: No hyperdense vessel. Skull: Negative for fracture or focal lesion. Sinuses/Orbits: Opacification of the right sphenoid sinus, with osseous thickening of the walls. Additional milder mucosal thickening in the maxillary sinuses. No acute finding in the orbits. Other: The mastoid air cells are well aerated. ASPECTS [facility] Stroke Program Early CT Score) - Ganglionic level infarction (caudate, lentiform nuclei, internal capsule, insula, M1-M3 cortex): 7 -  Supraganglionic infarction (M4-M6 cortex): 3 Total score (0-10 with 10 being normal): 10 IMPRESSION: 1. No acute intracranial process. ASPECTS is 10. 2. 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 3. Chronic right sphenoid sinusitis. Imaging results were communicated on 09/12/2023 at 12:42 pm to provider Dr. Bonnita Buttner via secure text paging. Electronically Signed   By: Zoila Hines M.D.   On: 09/12/2023 12:42    Procedures Procedures    Medications Ordered in ED Medications - No data to display  ED Course/ Medical Decision Making/ A&P                                 Medical Decision Making Patient brought to  the emergency department by EMS there is a question of whether deficits are old or new.  I evaluated the patient Dr. Monnie Anthony evaluate the patient and we spoke with Dr. Bonnita Buttner.  Code stroke was activated due to concerns for speech and weakness  Amount and/or Complexity of Data Reviewed Independent Historian: EMS External Data Reviewed: notes.    Details: Previous notes reviewed Labs: ordered.    Details: Labs ordered reviewed and interpreted.  Patient has a normal white blood cell count. Radiology: ordered.    Details: CT head shows no acute changes ECG/medicine tests: ordered and independent interpretation performed. Decision-making details documented in ED Course.    Details: EKG shows sinus bradycardia Discussion of management or test interpretation with external provider(s): Dr.Arora evaluated pt.  Ct angio ordered.  Dr. Bonnita Buttner will admit   Risk Decision regarding hospitalization.        Dr. Bonnita Buttner request hospitalist admit.   Pt's care turned over to [facility]   Final Clinical Impression(s) / ED Diagnoses Final diagnoses:  Acute CVA (cerebrovascular accident) [facility])    Rx / DC Orders ED Discharge Orders     None         Stanislawa Gaffin K, PA-C 09/12/23 1409     Sandi Crosby, PA-C 09/12/23 1443    Sandi Crosby, PA-C 09/12/23 1551    Trish Furl, MD 09/12/23 1615

## 2023-09-12 NOTE — ED Notes (Addendum)
 Mercedes [name]

## 2023-09-12 NOTE — Anesthesia Preprocedure Evaluation (Deleted)
Anesthesia Evaluation   Patient confused    Reviewed: Unable to perform ROS - Chart review onlyPreop documentation limited or incomplete due to emergent nature of procedure.  Airway Mallampati: Unable to assess       Dental   Pulmonary former smoker   breath sounds clear to auscultation       Cardiovascular hypertension, Pt. on medications + CAD, + Past MI, + CABG and + Peripheral Vascular Disease   Rhythm:Regular     Neuro/Psych  Headaches, Seizures -,  PSYCHIATRIC DISORDERS Anxiety Depression     Neuromuscular disease CVA, Residual Symptoms    GI/Hepatic ,GERD  ,,  Endo/Other  diabetes Hyperthyroidism   Renal/GU Renal InsufficiencyRenal diseaseLab Results      Component                Value               Date                      CREATININE               1.50 (H)            08/28/2022                Musculoskeletal  (+) Arthritis ,    Abdominal   Peds  Hematology  (+) Blood dyscrasia, anemia Lab Results      Component                Value               Date                      WBC                      7.5                 08/28/2022                HGB                      14.6                08/28/2022                HCT                      43.0                08/28/2022                MCV                      73.6 (L)            08/28/2022                PLT                      315                 08/28/2022              Anesthesia Other Findings   Reproductive/Obstetrics  Anesthesia Physical Anesthesia Plan  ASA: 4 and emergent  Anesthesia Plan: General   Post-op Pain Management:    Induction: Intravenous, Rapid sequence and Cricoid pressure planned  PONV Risk Score and Plan: 3  Airway Management Planned: Oral ETT  Additional Equipment:   Intra-op Plan:   Post-operative Plan: Possible Post-op intubation/ventilation  Informed Consent:       History available from chart only and Only emergency history available  Plan Discussed with: CRNA and Surgeon  Anesthesia Plan Comments:        Anesthesia Quick Evaluation

## 2023-09-12 NOTE — ED Provider Notes (Signed)
I provided a substantive portion of the care of this patient.  I personally made/approved the management plan for this patient and take responsibility for the patient management.       Linwood Dibbles, MD 09/13/23 [phone]

## 2023-09-12 NOTE — Evaluation (Signed)
 Clinical/Bedside Swallow Evaluation Patient Details  Name: Mercedes [name] MRN: [id number] Date of Birth: [birth year]  Today's Date: 09/12/2023 Time: SLP Start Time (ACUTE ONLY): 1644 SLP Stop Time (ACUTE ONLY): 1703 SLP Time Calculation (min) (ACUTE ONLY): 19 min  Past Medical History:  Past Medical History:  Diagnosis Date   Abnormal LFTs    a. in the past when on Lipitor    Acute ischemic right ICA stroke (HCC) 08/28/2022   Anemia    as a young woman   Anxiety    Arnold-Chiari malformation, type I (HCC)    MRI brain 02/2010   Atrial flutter with rapid ventricular response vs. SVT 10/27/2014   CAD (coronary artery disease)    a. NSTEMI 07/2009: LHC - D2 40%, LAD irreg., EF 50% with apical AK (tako-tsubo CM);  b. Inf STEMI (04/2013): Tx Promus DES to CFX;  c. 08/2013 Lexi CL: No ischemia, dist ant/ap/inf-ap infarct, EF  d. 06/2016 NSTEMI with LM disease: s/p CABG on 07/17/2016 w/ LIMA-LAD, SVG-OM, and SVG-dRCA.   DEPRESSION    Dizziness    ? 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 Alva Jewels   Sjogren's syndrome (HCC) 06/02/2013   pt denies this (12/01/13)   SLE (systemic lupus erythematosus) (HCC) dx 05/2013   follows with rheum Alva Jewels)   Stroke [facility])    March 2023, weakness in right arm is improving   Tachy-brady syndrome (HCC)    a. 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   f/u echo  09/2009: EF 50-55%, mild LVH, mod diast dysfxn, mild apical HK   Wears dentures    Wears glasses    Past Surgical History:  Past Surgical History:  Procedure Laterality Date   ABDOMINAL AORTAGRAM N/A 11/24/2013   Procedure: ABDOMINAL AORTAGRAM WITH BILATERAL RUNOFF WITH POSSIBLE INTERVENTION;  Surgeon: Dannis Dy, MD;  Location: [facility] CATH LAB;  Service: Cardiovascular;  Laterality: N/A;   ABDOMINAL AORTOGRAM W/LOWER EXTREMITY N/A 04/25/2019   Procedure: ABDOMINAL AORTOGRAM W/LOWER EXTREMITY;  Surgeon: Richrd Char, MD;  Location: [facility] INVASIVE CV LAB;  Service: Cardiovascular;  Laterality: N/A;   ABDOMINAL AORTOGRAM W/LOWER EXTREMITY Bilateral 05/15/2019   Procedure: ABDOMINAL AORTOGRAM W/LOWER EXTREMITY;  Surgeon: Margherita Shell, MD;  Location: [facility] INVASIVE CV LAB;  Service: Cardiovascular;  Laterality: Bilateral;   ABDOMINAL AORTOGRAM W/LOWER EXTREMITY N/A 08/23/2020   Procedure: ABDOMINAL AORTOGRAM W/LOWER EXTREMITY;  Surgeon: Adine Hoof, MD;  Location: [facility] INVASIVE CV LAB;  Service: Cardiovascular;  Laterality: N/A;   ABDOMINAL AORTOGRAM W/LOWER EXTREMITY N/A 10/29/2020   Procedure: ABDOMINAL AORTOGRAM W/LOWER EXTREMITY;  Surgeon: Richrd Char, MD;  Location: [facility] INVASIVE CV LAB;  Service: Cardiovascular;  Laterality: N/A;   AMPUTATION Left 12/02/2013   Procedure: LEFT 1ST TOE AMPUTATION;  Surgeon: Richrd Char, MD;  Location: [facility] OR;  Service: Vascular;  Laterality: Left;   AMPUTATION Left  07/16/2019   Procedure: LEFT TRANSMETATARSAL AMPUTATION;  Surgeon: Timothy Ford, MD;  Location: [facility] OR;  Service: Orthopedics;  Laterality: Left;   AMPUTATION Left 10/01/2019   Procedure: LEFT AMPUTATION ABOVE KNEE;  Surgeon: Richrd Char, MD;  Location: [facility] OR;  Service: Vascular;  Laterality: Left;   AMPUTATION Right 08/31/2020   Procedure: Right first toe amputation;  Surgeon: Richrd Char, MD;  Location: [facility] OR;  Service: Vascular;  Laterality: Right;   AMPUTATION  Right 11/23/2020   Procedure: RIGHT AMPUTATION ABOVE KNEE;  Surgeon: Richrd Char, MD;  Location: [facility] OR;  Service: Vascular;  Laterality: Right;   AMPUTATION Left 03/14/2022   Procedure: REVISION LEFT ABOVE KNEE AMPUTATION;  Surgeon: Adine Hoof, MD;  Location: [name] OR;  Service: Vascular;  Laterality: Left;   APPLICATION OF WOUND VAC Left 11/03/2019   Procedure: Application Of Wound Vac, left lower extremity;  Surgeon: Richrd Char, MD;  Location: [facility] OR;  Service: Vascular;  Laterality: Left;   CARDIAC CATHETERIZATION N/A 07/12/2016   Procedure: Left Heart Cath and Coronary Angiography;  Surgeon: Millicent Ally, MD;  Location: [facility] INVASIVE CV LAB;  Service: Cardiovascular;  Laterality: N/A;   CARDIAC CATHETERIZATION N/A 07/14/2016   Procedure: Intravascular Ultrasound/IVUS;  Surgeon: Sammy Crisp, MD;  Location: [facility] INVASIVE CV LAB;  Service: Cardiovascular;  Laterality: N/A;   COLONOSCOPY     CORONARY ANGIOPLASTY  04/2013   CORONARY ARTERY BYPASS GRAFT N/A 07/17/2016   Procedure: CORONARY ARTERY BYPASS GRAFTING (CABG)x3 using right greater saphenous vein harvested endoscopiclly and left internal mammary artery;  Surgeon: Heriberto London, MD;  Location: [facility] OR;  Service: Open Heart Surgery;  Laterality: N/A;   ENDARTERECTOMY FEMORAL Left 05/12/2019   Procedure: LEFT Femoral Endarterectomy;  Surgeon: Richrd Char, MD;  Location: [facility] OR;  Service: Vascular;  Laterality: Left;   FEMORAL-POPLITEAL BYPASS GRAFT Left 12/02/2013   Procedure:  LEFT FEMORAL-BELOW KNEE POPLITEAL ARTERY BYPASS GRAFT;  Surgeon: Richrd Char, MD;  Location: [name] OR;  Service: Vascular;  Laterality: Left;   FEMORAL-POPLITEAL BYPASS GRAFT Left 09/25/2019   Procedure: THROMBECTOMY and PROPATEN BYPASS  FEMORAL TO BELOW KNEE POPLITEAL ARTERY BYPASS GRAFT LEFT LEG;  Surgeon: Mayo Speck, MD;  Location: [facility] OR;  Service: Vascular;  Laterality: Left;   FEMORAL-POPLITEAL BYPASS GRAFT Right 08/24/2020   Procedure:  RIGHT FEMORAL-BELOW KNEE POPLITEAL ARTERY BYPASS USING GORE PROPATEN GRAFT;  Surgeon: Adine Hoof, MD;  Location: [facility] OR;  Service: Vascular;  Laterality: Right;   FEMORAL-POPLITEAL BYPASS GRAFT Right 11/01/2020   Procedure: Thrombectomy and Revision of Right Femoral to below knee Bypass with Interpostional graft to Tibial / peroneal Trunk and Endarterectomy Tibial Bartholome Boron Trunk.;  Surgeon: Richrd Char, MD;  Location: [facility] OR;  Service: Vascular;  Laterality: Right;   INTRAOPERATIVE ARTERIOGRAM Right 11/01/2020   Procedure: ANTEGRADE RIGHT LOWER EXTREMITY INTRA OPERATIVE ARTERIOGRAM;  Surgeon: Richrd Char, MD;  Location: [name] OR;  Service: Vascular;  Laterality: Right;   IR CT HEAD LTD  08/28/2022   IR PERCUTANEOUS ART THROMBECTOMY/INFUSION INTRACRANIAL INC DIAG ANGIO  08/28/2022   IR US  GUIDE VASC ACCESS RIGHT  08/28/2022   LEFT HEART CATHETERIZATION WITH CORONARY ANGIOGRAM N/A 05/19/2013   Procedure: LEFT HEART CATHETERIZATION WITH CORONARY ANGIOGRAM;  Surgeon: Arlander Bellman, MD;  Location: [facility] CATH LAB;  Service: Cardiovascular;  Laterality: N/A;   LEFT HEART CATHETERIZATION WITH CORONARY ANGIOGRAM N/A 10/28/2014   Procedure: LEFT HEART CATHETERIZATION WITH CORONARY ANGIOGRAM;  Surgeon: Wenona Hamilton,  MD;  Location: [facility] CATH LAB;  Service: Cardiovascular;  Laterality: N/A;   LOWER EXTREMITY ANGIOGRAM Left 05/12/2019   Procedure: Lower Extremity Angiogram;  Surgeon: Richrd Char, MD;  Location: [facility] OR;  Service: Vascular;  Laterality: Left;   PATCH ANGIOPLASTY Left 05/12/2019   Procedure: Left Femoral Patch Angioplasty USING CADAVERIC SAPHENOUS VEIN;  Surgeon: Richrd Char, MD;  Location: [facility] OR;  Service: Vascular;  Laterality: Left;   PERCUTANEOUS CORONARY STENT INTERVENTION (PCI-S)  05/19/2013   Procedure: PERCUTANEOUS CORONARY STENT INTERVENTION (PCI-S);  Surgeon: Arlander Bellman, MD;  Location: [facility] CATH LAB;  Service: Cardiovascular;;   PERIPHERAL VASCULAR INTERVENTION  Right 10/29/2020   Procedure: PERIPHERAL VASCULAR INTERVENTION;  Surgeon: Richrd Char, MD;  Location: [facility] INVASIVE CV LAB;  Service: Cardiovascular;  Laterality: Right;  common iliac   PERMANENT PACEMAKER INSERTION N/A 11/04/2014   STJ Leadless pacemaker implanted by Dr Nunzio Belch for tachy/brady   RADIOLOGY WITH ANESTHESIA N/A 08/28/2022   Procedure: RADIOLOGY WITH ANESTHESIA;  Surgeon: Radiologist, Medication, MD;  Location: [facility] OR;  Service: Radiology;  Laterality: N/A;   TEE WITHOUT CARDIOVERSION N/A 07/17/2016   Procedure: TRANSESOPHAGEAL ECHOCARDIOGRAM (TEE);  Surgeon: Heriberto London, MD;  Location: [facility] OR;  Service: Open Heart Surgery;  Laterality: N/A;   THROMBECTOMY FEMORAL ARTERY Left 05/12/2019   Procedure: Left Ilio-Femoral Artery and Femoral-popliteal Graft Thrombectomy;  Surgeon: Richrd Char, MD;  Location: [facility] OR;  Service: Vascular;  Laterality: Left;   THROMBECTOMY OF BYPASS GRAFT FEMORAL- POPLITEAL ARTERY Right 11/01/2020   Procedure: POSSIBLE THROMBOLYSIS VERSUS OPEN THROMBECTOMY AND REVSION OF RIGHT FEMORAL-POPLITEAL ARTERY BYPASS;  Surgeon: Richrd Char, MD;  Location: [facility] OR;  Service: Vascular;  Laterality: Right;   TUBAL LIGATION     VISCERAL ANGIOGRAPHY N/A 04/25/2019   Procedure: MESENTRIC  ANGIOGRAPHY;  Surgeon: Richrd Char, MD;  Location: [facility] INVASIVE CV LAB;  Service: Cardiovascular;  Laterality: N/A;   WOUND DEBRIDEMENT Left 11/03/2019   Procedure: Debridement Wound of left above the knee amputation;  Surgeon: Richrd Char, MD;  Location: [facility] OR;  Service: Vascular;  Laterality: Left;   HPI:  Mercedes [name] is a 68 yo female presenting to ED 1/15 with slurred speech, L facial droop, and L sided weakness/numbness. CTA shows R MCA and ACA occlusions and infarct core in R posterior frontal periventricular white matter with penumbra throughout the posterior R MCA. Was recommended to undergo emergent thrombectomy but pt and her family refused. Previously seen by  SLP January 2024 with oropharyngeal dysphagia and cognitive deficits. MBS 09/13/22 showed continued aspiration of thin liquids and recommended a Dys 2 diet with nectar thick liquid. PMH includes anemia, anxiety, CAD, GERD, HLD, HTN, MI, PAD, paroxysmal A-fib, RA, Sjogren's syndrome, previous CVA (R ICA 08/2022) with significant residual L sided weakness, tachy-brady syndrome, bilateral BKA    Assessment / Plan / Recommendation  Clinical Impression  Pt writing to communicate as her speech is significantly dysarthric. She reports she progressed to a regular diet with thin liquids upon d/c. Oral motor exam significant for severe L sided asymmetry and weakness. Per SLP notes from January 2024, pt had similar strength and sensation deficits at that time but pt's family reports she had progressively regained strength since then. Pt has significant oral deficits characterized by anterior spillage and oral residue with all trials. She was unable to initiate a sip via straw. Cup sips of thin liquids resulted in immediate coughing. Given history and acute signs concerning for dysphagia, recommend she  remain NPO pending completion of an MBS to further assess swallowing. Pt may have 4-5 ice chips following thorough oral care with full supervision from nursing staff and meds may be given crushed with puree. Discussed with pt, her family, and Charity fundraiser. SLP will f/u to complete MBS as scheduling allows, tentatively next date.  SLP Visit Diagnosis: Dysphagia, unspecified (R13.10)    Aspiration Risk  Moderate aspiration risk    Diet Recommendation NPO except meds;Ice chips PRN after oral care    Medication Administration: Crushed with puree    Other  Recommendations Oral Care Recommendations: Oral care QID;Oral care prior to ice chip/H20    Recommendations for follow up therapy are one component of a multi-disciplinary discharge planning process, led by the attending physician.  Recommendations may be updated based on  patient status, additional functional criteria and insurance authorization.  Follow up Recommendations Skilled nursing-short term rehab (<3 hours/day)      Assistance Recommended at Discharge    Functional Status Assessment Patient has had a recent decline in their functional status and demonstrates the ability to make significant improvements in function in a reasonable and predictable amount of time.  Frequency and Duration min 2x/week  2 weeks       Prognosis Prognosis for improved oropharyngeal function: Fair Barriers to Reach Goals: Cognitive deficits;Time post onset;Severity of deficits      Swallow Study   General HPI: Mercedes [name] is a 68 yo female presenting to ED 1/15 with slurred speech, L facial droop, and L sided weakness/numbness. CTA shows R MCA and ACA occlusions and infarct core in R posterior frontal periventricular white matter with penumbra throughout the posterior R MCA. Was recommended to undergo emergent thrombectomy but pt and her family refused. Previously seen by SLP January 2024 with oropharyngeal dysphagia and cognitive deficits. MBS 09/13/22 showed continued aspiration of thin liquids and recommended a Dys 2 diet with nectar thick liquid. PMH includes anemia, anxiety, CAD, GERD, HLD, HTN, MI, PAD, paroxysmal A-fib, RA, Sjogren's syndrome, previous CVA (R ICA 08/2022) with significant residual L sided weakness, tachy-brady syndrome, bilateral BKA Type of Study: Bedside Swallow Evaluation Previous Swallow Assessment: see HPI Diet Prior to this Study: NPO Temperature Spikes Noted: No Respiratory Status: Room air History of Recent Intubation: No Behavior/Cognition: Alert;Cooperative Oral Cavity Assessment: Within Functional Limits Oral Care Completed by SLP: No Oral Cavity - Dentition: Adequate natural dentition Vision: Functional for self-feeding Self-Feeding Abilities: Needs assist Patient Positioning: Upright in bed Baseline Vocal Quality:  Normal Volitional Cough: Strong Volitional Swallow: Able to elicit    Oral/Motor/Sensory Function Overall Oral Motor/Sensory Function: Severe impairment Facial ROM: Reduced left;Suspected CN VII (facial) dysfunction Facial Symmetry: Abnormal symmetry left;Suspected CN VII (facial) dysfunction Facial Strength: Reduced left;Suspected CN VII (facial) dysfunction Facial Sensation: Reduced left;Suspected CN V (Trigeminal) dysfunction Lingual ROM: Reduced left;Suspected CN XII (hypoglossal) dysfunction Lingual Symmetry: Abnormal symmetry left;Suspected CN XII (hypoglossal) dysfunction Lingual Strength: Suspected CN XII (hypoglossal) dysfunction;Reduced Lingual Sensation: Reduced;Suspected CN VII (facial) dysfunction-anterior 2/3 tongue   Ice Chips Ice chips: Within functional limits Presentation: Spoon   Thin Liquid Thin Liquid: Impaired Presentation: Straw;Cup;Spoon Pharyngeal  Phase Impairments: Cough - Immediate    Nectar Thick Nectar Thick Liquid: Not tested   Honey Thick Honey Thick Liquid: Not tested   Puree Puree: Within functional limits Presentation: Spoon;Self Fed   Solid     Solid: Not tested      Amil Kale, M.A., CF-SLP Speech Language Pathology, Acute Rehabilitation Services  Secure Chat preferred [phone]  09/12/2023,5:20 PM

## 2023-09-12 NOTE — Sedation Documentation (Signed)
 Care per anesthesia at this time. Ceclia Cohens, CRNA at bedside. See CRNA documentation.

## 2023-09-12 NOTE — Progress Notes (Signed)
 SLP Cancellation Note  Patient Details Name: RICHLYNN GAN MRN: [id number] DOB: [birth year]   Cancelled treatment:       Reason Eval/Treat Not Completed: Patient at procedure or test/unavailable. SLP will f/u at a later time as schedule permits.    Amil Kale, M.A., CF-SLP Speech Language Pathology, Acute Rehabilitation Services  Secure Chat preferred [phone]  09/12/2023, 2:46 PM

## 2023-09-12 NOTE — ED Notes (Signed)
 Code stroke paged out per neurology, pt to ct with primary rn

## 2023-09-12 NOTE — ED Notes (Signed)
 Patient changed; brief placed; purewick replaced.

## 2023-09-12 NOTE — ED Notes (Signed)
 Per Dr. Ann Keto, permissive hypertension up to 220/120 okay.

## 2023-09-12 NOTE — Sedation Documentation (Addendum)
 Pt. To IR for Code Stroke at 1438. Consent obtained from pt. Granddaughter, Colie Dawes by Dr. Evalee Hila prior to arrival. Upon arrival, pt. Able to write for communication. Pt. Requested to NOT proceed with IR procedure. Stroke care delayed due to this request per Dr. Alvira Josephs. Pt. Sister, Alvy Baar, called by Dr. Alvira Josephs by phone per pt. Written request. Dr. Alvira Josephs, Dr. Evalee Hila on phone with family to discuss delay and if IR can proceed with stroke procedure. 1510 - decision to NOT proceed with IR stroke care made with MD x2, pt. Written request, and family by phone. No meds administered, no invasive procedures attempted. Pt. To go to ER 43 from IR.

## 2023-09-12 NOTE — Code Documentation (Signed)
 Stroke Response Nurse Documentation Code Documentation  Mercedes [name] is a 68 y.o. female arriving to [facility]  via [facility] on 09-12-2023 with past medical hx of CVA, HTN, Lupus, DM, bilat AKA . On Eliquis  (apixaban ) daily. Code stroke was activated by ED.   Patient from home where she was LKW at 1700 Yesterday and now complaining of waking up with slurred speech and difficulty speaking.  Stroke team at the bedside on patient arrival. Labs drawn and patient cleared for CT by Dr. Monnie Anthony. Patient to CT with team. NIHSS 9, see documentation for details and code stroke times. Patient with left facial droop, left arm weakness, left decreased sensation, Expressive aphasia , and dysarthria  on exam. The following imaging was completed:  CT Head. Patient is not a candidate for IV Thrombolytic due to being outside treatment window and being on Eliquis . Patient is not a candidate for IR due to baseline modified rankin score.   She is alert and able to communicate with gestures and some words.  She is having trouble with her secretions and is coughing when swallowing her secretions.  Swallow screen deferred to speech therapy.   Care Plan: VS and NIHSS q 2 hours x 12 hours.   Bedside handoff with ED RN Mylinda Asa.    Waldemar Guillaume  Stroke Response RN

## 2023-09-12 NOTE — ED Notes (Signed)
 ED TO INPATIENT HANDOFF REPORT  ED Nurse Name and Phone #: Ouida Bloom [phone]  S Name/Age/Gender Mercedes [name] 68 y.o. female Room/Bed: 043C/043C  Code Status   Code Status: Full Code  Home/SNF/Other Skilled nursing facility Patient oriented to: self, place, and time Is this baseline? No   Triage Complete: Triage complete  Chief Complaint Acute ischemic right MCA stroke [facility]) [I63.511] Acute CVA (cerebrovascular accident) [facility]) [I63.9]  Triage Note Pt coming in from home . Pt coming in with stroke like symptoms. Last known well was 5pm last night.  Pt woke up this morning with slurred speech aphasia left sided facial droop and abnormal sensation to left side of face. And left arm weakness. Pt normally has no issues communicating. Pt has a history of stroke on the left side. Pt reports history of resolved left sided weakness.   Bp 195/98 Hr 68 Rr 16 Spo2 97% on ra Cbg 138   Allergies Allergies  Allergen Reactions   Brilinta  [Ticagrelor ] Anaphylaxis, Shortness Of Breath and Other (See Comments)    Also, chest tightness   Latex Rash   Tape Rash    Level of Care/Admitting Diagnosis ED Disposition     ED Disposition  Admit   Condition  --   Comment  Hospital Area: [facility] [[id number]]  Level of Care: Telemetry Medical [[id number]]  May admit patient to Arlin Benes or Maryan Smalling if equivalent level of care is available:: No  Covid Evaluation: Asymptomatic - no recent exposure (last 10 days) testing not required  Diagnosis: Acute CVA (cerebrovascular accident) [facility]) [[id number]]  Admitting Physician: MELVIN, ALEXANDER B [[id number]]  Attending Physician: MELVIN, ALEXANDER B [phone]  Certification:: I certify this patient will need inpatient services for at least 2 midnights          B Medical/Surgery History Past Medical History:  Diagnosis Date   Abnormal LFTs    a. in the past when on Lipitor    Acute ischemic right ICA stroke (HCC) 08/28/2022    Anemia    as a young woman   Anxiety    Arnold-Chiari malformation, type I (HCC)    MRI brain 02/2010   Atrial flutter with rapid ventricular response vs. SVT 10/27/2014   CAD (coronary artery disease)    a. NSTEMI 07/2009: LHC - D2 40%, LAD irreg., EF 50% with apical AK (tako-tsubo CM);  b. Inf STEMI (04/2013): Tx Promus DES to CFX;  c. 08/2013 Lexi CL: No ischemia, dist ant/ap/inf-ap infarct, EF  d. 06/2016 NSTEMI with LM disease: s/p CABG on 07/17/2016 w/ LIMA-LAD, SVG-OM, and SVG-dRCA.   DEPRESSION    Dizziness    ? 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 Alva Jewels   Sjogren's syndrome (HCC) 06/02/2013   pt denies this (12/01/13)   SLE (systemic lupus erythematosus) (HCC) dx 05/2013   follows with rheum Alva Jewels)   Stroke [facility])  March 2023, weakness in right arm is improving   Tachy-brady syndrome (HCC)    a. 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   f/u echo 09/2009: EF 50-55%, mild LVH, mod diast dysfxn, mild apical HK   Wears dentures    Wears glasses    Past Surgical History:  Procedure Laterality Date   ABDOMINAL AORTAGRAM N/A 11/24/2013   Procedure: ABDOMINAL AORTAGRAM WITH BILATERAL RUNOFF WITH POSSIBLE INTERVENTION;  Surgeon: Dannis Dy, MD;  Location: [facility] CATH LAB;  Service: Cardiovascular;  Laterality: N/A;   ABDOMINAL AORTOGRAM W/LOWER EXTREMITY N/A 04/25/2019    Procedure: ABDOMINAL AORTOGRAM W/LOWER EXTREMITY;  Surgeon: Richrd Char, MD;  [facility] INVASIVE CV LAB;  Service: Cardiovascular;  Laterality: N/A;   ABDOMINAL AORTOGRAM W/LOWER EXTREMITY Bilateral 05/15/2019   Procedure: ABDOMINAL AORTOGRAM W/LOWER EXTREMITY;  Surgeon: Margherita Shell, MD;  [facility]: [facility] INVASIVE CV LAB;  Service: Cardiovascular;  Laterality: Bilateral;   ABDOMINAL AORTOGRAM W/LOWER EXTREMITY N/A 08/23/2020   Procedure: ABDOMINAL AORTOGRAM W/LOWER EXTREMITY;  Surgeon: Adine Hoof, MD;  Location: [facility] INVASIVE CV LAB;  Service: Cardiovascular;  Laterality: N/A;   ABDOMINAL AORTOGRAM W/LOWER EXTREMITY N/A 10/29/2020   Procedure: ABDOMINAL AORTOGRAM W/LOWER EXTREMITY;  Surgeon: Richrd Char, MD;  [facility] INVASIVE CV LAB;  Service: Cardiovascular;  Laterality: N/A;   AMPUTATION Left 12/02/2013   Procedure: LEFT 1ST TOE AMPUTATION;  Surgeon: Richrd Char, MD;  Location: [facility] OR;  Service: Vascular;  Laterality: Left;   AMPUTATION Left 07/16/2019   Procedure: LEFT TRANSMETATARSAL AMPUTATION;  Surgeon: Timothy Ford, MD;  Location: [facility] OR;  Service: Orthopedics;  Laterality: Left;   AMPUTATION Left 10/01/2019   Procedure: LEFT AMPUTATION ABOVE KNEE;  Surgeon: Richrd Char, MD;  Location: [facility] OR;  Service: Vascular;  Laterality: Left;   AMPUTATION Right 08/31/2020   Procedure: Right first toe amputation;  Surgeon: Richrd Char, MD;  Location: [facility] OR;  Service: Vascular;  Laterality: Right;   AMPUTATION Right 11/23/2020   Procedure: RIGHT AMPUTATION ABOVE KNEE;  Surgeon: Richrd Char, MD;  Location: [facility] OR;  Service: Vascular;  Laterality: Right;   AMPUTATION Left 03/14/2022   Procedure: REVISION LEFT ABOVE KNEE AMPUTATION;  Surgeon: Adine Hoof, MD;  Location: [facility] OR;  Service: Vascular;  Laterality: Left;   APPLICATION OF WOUND VAC Left 11/03/2019   Procedure: Application Of Wound Vac, left lower extremity;  Surgeon: Richrd Char, MD;   Location: [facility] OR;  Service: Vascular;  Laterality: Left;   CARDIAC CATHETERIZATION N/A 07/12/2016   Procedure: Left Heart Cath and Coronary Angiography;  Surgeon: Millicent Ally, MD;  [facility] INV[facility] CV LAB;  Service: Cardiovascular;  Laterality: N/A;   CARDIAC CATHETERIZATION N/A 07/14/2016   Procedure: Intravascular Ultrasound/IVUS;  Surgeon: Sammy Crisp, MD;  [facility] INVAS[facility] CV LAB;  Service: Cardiovascular;  Laterality: N/A;   COLONOSCOPY     CORONARY ANGIOPLASTY  04/2013   CORONARY ARTERY BYPASS GRAFT N/A 07/17/2016   Procedure: CORONARY ARTERY BYPASS GRAFTING (CABG)x3 using right greater saphenous vein harvested endoscopiclly and left internal mammary artery;  Surgeon: Heriberto London, MD;  Location: [facility] OR;  Service: Open Heart Surgery;  Laterality: N/A;   ENDARTERECTOMY FEMORAL Left 05/12/2019   Procedure: LEFT Femoral Endarterectomy;  Surgeon: Richrd Char, MD;  Location: [facility] OR;  Service: Vascular;  Laterality: Left;   FEMORAL-POPLITEAL BYPASS GRAFT Left 12/02/2013   Procedure:  LEFT FEMORAL-BELOW KNEE POPLITEAL ARTERY BYPASS GRAFT;  Surgeon: Ethelle Herb  Robert Chimes, MD;  [facility] OR;  Service: Vascular;  Laterality: Left;   FEMORAL-POPLITEAL BYPASS GRAFT Left 09/25/2019   Procedure: THROMBECTOMY and PROPATEN BYPASS  FEMORAL TO BELOW KNEE POPLITEAL ARTERY BYPASS GRAFT LEFT LEG;  Surgeon: Mayo Speck, MD;  [facility] OR;  Service: Vascular;  Laterality: Left;   FEMORAL-POPLITEAL BYPASS GRAFT Right 08/24/2020   Procedure: RIGHT FEMORAL-BELOW KNEE POPLITEAL ARTERY BYPASS USING GORE PROPATEN GRAFT;  Surgeon: Adine Hoof, MD;  Location: [facility] OR;  Service: Vascular;  Laterality: Right;   FEMORAL-POPLITEAL BYPASS GRAFT Right 11/01/2020   Procedure: Thrombectomy and Revision of Right Femoral to below knee Bypass with Interpostional graft to Tibial / peroneal Trunk and Endarterectomy Tibial Bartholome Boron Trunk.;  Surgeon: Richrd Char, MD;  Location: [facility] OR;  Service:  Vascular;  Laterality: Right;   INTRAOPERATIVE ARTERIOGRAM Right 11/01/2020   Procedure: ANTEGRADE RIGHT LOWER EXTREMITY INTRA OPERATIVE ARTERIOGRAM;  Surgeon: Richrd Char, MD;  Location: [facility] OR;  Service: Vascular;  Laterality: Right;   IR CT HEAD LTD  08/28/2022   IR PERCUTANEOUS ART THROMBECTOMY/INFUSION INTRACRANIAL INC DIAG ANGIO  08/28/2022   IR US  GUIDE VASC ACCESS RIGHT  08/28/2022   LEFT HEART CATHETERIZATION WITH CORONARY ANGIOGRAM N/A 05/19/2013   Procedure: LEFT HEART CATHETERIZATION WITH CORONARY ANGIOGRAM;  Surgeon: Arlander Bellman, MD;  Location: [facility] CATH LAB;  Service: Cardiovascular;  Laterality: N/A;   LEFT HEART CATHETERIZATION WITH CORONARY ANGIOGRAM N/A 10/28/2014   Procedure: LEFT HEART CATHETERIZATION WITH CORONARY ANGIOGRAM;  Surgeon: Wenona Hamilton, MD;  [facility] CATH [facility];  Service: Cardiovascular;  Laterality: N/A;   LOWER EXTREMITY ANGIOGRAM Left 05/12/2019   Procedure: Lower Extremity Angiogram;  Surgeon: Richrd Char, MD;  Location: [facility] OR;  Service: Vascular;  Laterality: Left;   PATCH ANGIOPLASTY Left 05/12/2019   Procedure: Left Femoral Patch Angioplasty USING CADAVERIC SAPHENOUS VEIN;  Surgeon: Richrd Char, MD;  Location: [facility] OR;  Service: Vascular;  Laterality: Left;   PERCUTANEOUS CORONARY STENT INTERVENTION (PCI-S)  05/19/2013   Procedure: PERCUTANEOUS CORONARY STENT INTERVENTION (PCI-S);  Surgeon: Arlander Bellman, MD;  Location: [facility] CATH LAB;  Service: Cardiovascular;;   PERIPHERAL VASCULAR INTERVENTION Right 10/29/2020   Procedure: PERIPHERAL VASCULAR INTERVENTION;  Surgeon: Richrd Char, MD;  Location[facility] INVASIVE CV LAB;  Service: Cardiovascular;  Laterality: Right;  common iliac   PERMANENT PACEMAKER INSERTION N/A 11/04/2014   STJ Leadless pacemaker implanted by Dr Nunzio Belch for tachy/brady   RADIOLOGY WITH ANESTHESIA N/A 08/28/2022   Procedure: RADIOLOGY WITH ANESTHESIA;  Surgeon: Radiologist, Medication, MD;  [facility];  Service: Radiology;   Laterality: N/A;   TEE WITHOUT CARDIOVERSION N/A 07/17/2016   Procedure: TRANSESOPHAGEAL ECHOCARDIOGRAM (TEE);  Surgeon: Heriberto London, MD;  Location: [facility] OR;  Service: Open Heart Surgery;  Laterality: N/A;   THROMBECTOMY FEMORAL ARTERY Left 05/12/2019   Procedure: Left Ilio-Femoral Artery and Femoral-popliteal Graft Thrombectomy;  Surgeon: Richrd Char, MD;  Location: [facility] OR;  Service: Vascular;  Laterality: Left;   THROMBECTOMY OF BYPASS GRAFT FEMORAL- POPLITEAL ARTERY Right 11/01/2020   Procedure: POSSIBLE THROMBOLYSIS VERSUS OPEN THROMBECTOMY AND REVSION OF RIGHT FEMORAL-POPLITEAL ARTERY BYPASS;  Surgeon: Richrd Char, MD;  [facility] OR;  Service: Vascular;  Laterality: Right;   TUBAL LIGATION     VISCERAL ANGIOGRAPHY N/A 04/25/2019   Procedure: MESENTRIC  ANGIOGRAPHY;  Surgeon: Richrd Char, MD;  [facility] INVASIVE CV LAB;  Service: Cardiovascular;  Laterality: N/A;   WOUND DEBRIDEMENT Left 11/03/2019   Procedure: Debridement  Wound of left above the knee amputation;  Surgeon: Richrd Char, MD;  Location: [facility] OR;  Service: Vascular;  Laterality: Left;     A IV Location/Drains/Wounds Patient Lines/Drains/Airways Status     Active Line/Drains/Airways     Name Placement date Placement time Site Days   Peripheral IV 09/12/23 18 G Anterior;Left;Proximal Forearm 09/12/23  1448  Forearm  less than 1            Intake/Output Last 24 hours No intake or output data in the 24 hours ending 09/12/23 1928  Labs/Imaging Results for orders placed or performed during the hospital encounter of 09/12/23 (from the past 48 hours)  CBG monitoring, ED     Status: None   Collection Time: 09/12/23 12:15 PM  Result Value Ref Range   Glucose-Capillary 98 70 - 99 mg/dL    Comment: Glucose reference range applies only to samples taken after fasting for at least 8 hours.  CBG monitoring, ED     Status: None   Collection Time: 09/12/23 12:23 PM  Result Value Ref Range    Glucose-Capillary 91 70 - 99 mg/dL    Comment: Glucose reference range applies only to samples taken after fasting for at least 8 hours.  CBC with Differential     Status: Abnormal   Collection Time: 09/12/23 12:25 PM  Result Value Ref Range   WBC 4.4 4.0 - 10.5 K/uL   RBC 5.24 (H) 3.87 - 5.11 MIL/uL   Hemoglobin 12.5 12.0 - 15.0 g/dL   HCT 16.1 09.6 - 04.5 %   MCV 77.7 (L) 80.0 - 100.0 fL   MCH 23.9 (L) 26.0 - 34.0 pg   MCHC 30.7 30.0 - 36.0 g/dL   RDW 40.9 (H) 81.1 - 91.4 %   Platelets 335 150 - 400 K/uL   nRBC 0.0 0.0 - 0.2 %   Neutrophils Relative % 54 %   Neutro Abs 2.4 1.7 - 7.7 K/uL   Lymphocytes Relative 32 %   Lymphs Abs 1.4 0.7 - 4.0 K/uL   Monocytes Relative 9 %   Monocytes Absolute 0.4 0.1 - 1.0 K/uL   Eosinophils Relative 3 %   Eosinophils Absolute 0.1 0.0 - 0.5 K/uL   Basophils Relative 2 %   Basophils Absolute 0.1 0.0 - 0.1 K/uL   Immature Granulocytes 0 %   Abs Immature Granulocytes 0.01 0.00 - 0.07 K/uL    Comment: Performed at [facility] Lab, [address], [place], [place] [address]  Comprehensive metabolic panel     Status: Abnormal   Collection Time: 09/12/23 12:25 PM  Result Value Ref Range   Sodium 138 135 - 145 mmol/L   Potassium 3.9 3.5 - 5.1 mmol/L   Chloride 107 98 - 111 mmol/L   CO2 23 22 - 32 mmol/L   Glucose, Bld 99 70 - 99 mg/dL    Comment: Glucose reference range applies only to samples taken after fasting for at least 8 hours.   BUN 9 8 - 23 mg/dL   Creatinine, Ser 6.21 0.44 - 1.00 mg/dL   Calcium  9.0 8.9 - 10.3 mg/dL   Total Protein 7.6 6.5 - 8.1 g/dL   Albumin  2.8 (L) 3.5 - 5.0 g/dL   AST 18 15 - 41 U/L   ALT 7 0 - 44 U/L   Alkaline Phosphatase 71 38 - 126 U/L   Total Bilirubin 0.7 0.0 - 1.2 mg/dL   GFR, Estimated >30 >86 mL/min    Comment: (NOTE) Calculated  using the CKD-EPI Creatinine Equation (2021)    Anion gap 8 5 - 15    Comment: Performed at [facility] Lab, [address] [place], [place] [address]  Protime-INR      Status: None   Collection Time: 09/12/23 12:25 PM  Result Value Ref Range   Prothrombin Time 14.0 11.4 - 15.2 seconds   INR 1.1 0.8 - 1.2    Comment: (NOTE) INR goal varies based on device and disease states. Performed at [facility], [address], [place], [place] [address]   Ethanol     Status: None   Collection Time: 09/12/23 12:25 PM  Result Value Ref Range   Alcohol, Ethyl (B) <10 <10 mg/dL    Comment: (NOTE) Lowest detectable limit for serum alcohol is 10 mg/dL.  For medical purposes only. Performed at [facility] Lab, [address] [place], [place] [address]   APTT     Status: None   Collection Time: 09/12/23 12:25 PM  Result Value Ref Range   aPTT 28 24 - 36 seconds    Comment: Performed at [facility] Lab, [address], [place], [place] [address]  Hemoglobin A1c     Status: Abnormal   Collection Time: 09/12/23 12:25 PM  Result Value Ref Range   Hgb A1c MFr Bld 6.0 (H) 4.8 - 5.6 %    Comment: (NOTE) Pre diabetes:          5.7%-6.4%  Diabetes:              >6.4%  Glycemic control for   <7.0% adults with diabetes    Mean Plasma Glucose 125.5 mg/dL    Comment: Performed at [facility] Lab, [address], [place], [place] [address]  Merrilee Abernethy [address], ED     Status: Abnormal   Collection Time: 09/12/23 12:32 PM  Result Value Ref Range   Sodium 139 135 - 145 mmol/L   Potassium 3.9 3.5 - 5.1 mmol/L   Chloride 108 98 - 111 mmol/L   BUN 10 8 - 23 mg/dL   Creatinine, Ser 6.96 0.44 - 1.00 mg/dL   Glucose, Bld 97 70 - 99 mg/dL    Comment: Glucose reference range applies only to samples taken after fasting for at least 8 hours.   Calcium , Ion 1.14 (L) 1.15 - 1.40 mmol/L   TCO2 23 22 - 32 mmol/L   Hemoglobin 13.9 12.0 - 15.0 g/dL   HCT 29.5 28.4 - 13.2 %  Resp panel by RT-PCR (RSV, Flu A&B, Covid) Anterior Nasal Swab     Status: None   Collection Time: 09/12/23  3:18 PM   Specimen: Anterior Nasal Swab  Result Value Ref Range   SARS Coronavirus  2 by RT PCR NEGATIVE NEGATIVE   Influenza A by PCR NEGATIVE NEGATIVE   Influenza B by PCR NEGATIVE NEGATIVE    Comment: (NOTE) The Xpert Xpress SARS-CoV-2/FLU/RSV plus assay is intended as an aid in the diagnosis of influenza from Nasopharyngeal swab specimens and should not be used as a sole basis for treatment. Nasal washings and aspirates are unacceptable for Xpert Xpress SARS-CoV-2/FLU/RSV testing.  Fact Sheet for Patients: BloggerCourse.com  Fact Sheet for Healthcare Providers: SeriousBroker.it  This test is not yet approved or cleared by the [place]  FDA and has been authorized for detection and/or diagnosis of SARS-CoV-2 by FDA under an Emergency Use Authorization (EUA). This EUA will remain in effect (meaning this test can be used) for the  duration of the COVID-19 declaration under Section 564(b)(1) of the Act, 21 U.S.C. section 360bbb-3(b)(1), unless the authorization is terminated or revoked.     Resp Syncytial Virus by PCR NEGATIVE NEGATIVE    Comment: (NOTE) Fact Sheet for Patients: BloggerCourse.com  Fact Sheet for Healthcare Providers: SeriousBroker.it  This test is not yet approved or cleared by the [place]  FDA and has been authorized for detection and/or diagnosis of SARS-CoV-2 by FDA under an Emergency Use Authorization (EUA). This EUA will remain in effect (meaning this test can be used) for the duration of the COVID-19 declaration under Section 564(b)(1) of the Act, 21 U.S.C. section 360bbb-3(b)(1), unless the authorization is terminated or revoked.  Performed at [facility] Lab, [address] [place], [place] [address]   HIV Antibody (routine testing w rflx)     Status: None   Collection Time: 09/12/23  4:46 PM  Result Value Ref Range   HIV Screen 4th Generation wRfx Non Reactive Non Reactive    Comment: Performed at [facility]  Lab, [address] [place], [place] [address]   *Note: Due to a large number of results and/or encounters for the requested time period, some results have not been displayed. A complete set of results can be found in Results Review.   DG Chest Portable 1 View Result Date: 09/12/2023 CLINICAL DATA:  Stroke workup slurred speech EXAM: PORTABLE CHEST 1 VIEW COMPARISON:  01/31/2019 FINDINGS: Post sternotomy changes. No acute airspace disease or effusion. Normal cardiomediastinal silhouette with aortic atherosclerosis. Leadless pacemaker is noted. IMPRESSION: No active disease. Electronically Signed   By: Esmeralda Hedge M.D.   On: 09/12/2023 15:41   CT ANGIO HEAD NECK W WO CM W PERF (CODE STROKE) Result Date: 09/12/2023 CLINICAL DATA:  Aphasia, stroke suspected EXAM: CT ANGIOGRAPHY HEAD AND NECK CT PERFUSION BRAIN TECHNIQUE: Multidetector CT imaging of the head and neck was performed using the standard protocol during bolus administration of intravenous contrast. Multiplanar CT image reconstructions and MIPs were obtained to evaluate the vascular anatomy. Carotid stenosis measurements (when applicable) are obtained utilizing NASCET criteria, using the distal internal carotid diameter as the denominator. Multiphase CT imaging of the brain was performed following IV bolus contrast injection. Subsequent parametric perfusion maps were calculated using RAPID software. RADIATION DOSE REDUCTION: This exam was performed according to the departmental dose-optimization program which includes automated exposure control, adjustment of the mA and/or kV according to patient size and/or use of iterative reconstruction technique. CONTRAST:  100 mL Omnipaque  350 COMPARISON:  CTA 08/28/2022, CT head 09/12/2023 FINDINGS: CT HEAD FINDINGS For noncontrast findings, please see same day CT head. CTA NECK FINDINGS Aortic arch: The arch is not included in the field of view. Right carotid system: 50% stenosis in the proximal right ICA,  secondary to noncalcified plaque (series 8, image 269). Moderate stenosis at the origin of the right ECA. No evidence of dissection. Previously noted distal ICA occlusion is no longer seen. Left carotid system: 70% stenosis in the proximal left ICA (series 8, image 421). No significant stenosis at the origin of the ECA. No evidence of dissection. Vertebral arteries: No evidence of dissection, occlusion, or hemodynamically significant stenosis (greater than 50%). Skeleton: No acute osseous abnormality. Degenerative changes in the cervical spine. Other neck: 4 mm hypoenhancing nodule in the left thyroid , for which no follow-up is currently indicated. (Reference: J Am Coll Radiol. 2015 Feb;12(2): 143-50). Fluid in the esophagus, which is patulous, increasing the risk for aspiration. Upper chest: No focal  pulmonary opacity or pleural effusion. Emphysema. Review of the MIP images confirms the above findings CTA HEAD FINDINGS Anterior circulation: Both internal carotid arteries are patent to the termini, with calcifications but without significant stenosis. A1 segments patent. Normal anterior communicating artery. Occlusion of the right A2 just after its origin (series 8, image 146 and 140), with distal reconstitution (series 8, image 112). The left A2 and distal left ACA branches are patent. Mild stenosis in the proximal right M1. Occlusion of the proximal right M2 insular branch (series 8, image 138), with reconstitution in its M3 branches (series 8, image 133). The more lateral right M3 is subsequently reoccluded distally (series 8, image 124), with additional multifocal occlusion of its downstream branches (series 8, image 123 and 121). While other downstream branches of the more medial right M3 are occluded more superiorly (series 8, image 106). The anterior right M2 and is branches are patent. The left M1 and left MCA branches are patent, without significant stenosis. Posterior circulation: Vertebral arteries patent  to the vertebrobasilar junction without significant stenosis. Posterior inferior cerebellar arteries patent proximally. Basilar patent to its distal aspect without significant stenosis. Superior cerebellar arteries patent proximally. Patent right P1. Fetal origin of the left PCA from the left posterior communicating artery. The right posterior communicating artery is also patent. PCAs perfused to their distal aspects without significant stenosis. Venous sinuses: As permitted by contrast timing, patent. Anatomic variants: Fetal left PCA. No evidence of aneurysm or vascular malformation. Review of the MIP images confirms the above findings CT Brain Perfusion Findings: ASPECTS: 10 CBF (<30%) Volume: 5mL Perfusion (Tmax>6.0s) volume: Mismatch Volume: Infarction Location:Infarct core in the right posterior frontal periventricular white matter, penumbra throughout the posterior right MCA territory. Code stroke imaging results were communicated on 09/12/2023 at 2:27 pm to provider Dr. Bonnita Buttner via telephone, who verbally acknowledged these results. IMPRESSION: 1. Occlusion of the proximal right M2 insular branch, with reconstitution in its M3 branches and multifocal more downstream occlusion and reconstitution. 2. Occlusion of the right A2 just after its origin, with distal reconstitution. 3. Infarct core in the right posterior frontal periventricular white matter, with penumbra throughout the posterior right MCA territory, measuring 5 mL. 4. 50% stenosis in the proximal right ICA, secondary to noncalcified plaque. 5. 70% stenosis in the proximal left ICA. 6. Fluid in the esophagus, which is patulous, increasing the risk for aspiration. Right MCA occlusion was discussed on 09/12/2023 at 2:27 pm with provider Dr. Bonnita Buttner via telephone, who verbally acknowledged these results. Right ACA occlusion was communicated on 09/12/2023 at 2:48 pm to provider Dr. Bonnita Buttner via secure text paging. Electronically Signed   By: Zoila Hines M.D.   On: 09/12/2023 14:49   CT HEAD CODE STROKE WO CONTRAST` Result Date: 09/12/2023 CLINICAL DATA:  Code stroke.  Aphasia EXAM: CT HEAD WITHOUT CONTRAST TECHNIQUE: Contiguous axial images were obtained from the base of the skull through the vertex without intravenous contrast. RADIATION DOSE REDUCTION: This exam was performed according to the departmental dose-optimization program which includes automated exposure control, adjustment of the mA and/or kV according to patient size and/or use of iterative reconstruction technique. COMPARISON:  09/07/2022 CT head FINDINGS: Brain: No evidence of acute infarction, hemorrhage, mass, mass effect, or midline shift. No hydrocephalus or extra-axial collection. Encephalomalacia in the right lentiform nucleus and external capsule, consistent with sequela of the acute infarct noted in January 2024, with widening of the right sylvian fissure. Basal ganglia calcifications. Vascular: No hyperdense vessel. Skull: Negative  for fracture or focal lesion. Sinuses/Orbits: Opacification of the right sphenoid sinus, with osseous thickening of the walls. Additional milder mucosal thickening in the maxillary sinuses. No acute finding in the orbits. Other: The mastoid air cells are well aerated. ASPECTS [facility] Stroke Program Early CT Score) - Ganglionic level infarction (caudate, lentiform nuclei, internal capsule, insula, M1-M3 cortex): 7 - Supraganglionic infarction (M4-M6 cortex): 3 Total score (0-10 with 10 being normal): 10 IMPRESSION: 1. No acute intracranial process. ASPECTS is 10. 2. 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 3. Chronic right sphenoid sinusitis. Imaging results were communicated on 09/12/2023 at 12:42 pm to provider Dr. Bonnita Buttner via secure text paging. Electronically Signed   By: Zoila Hines M.D.   On: 09/12/2023 12:42    Pending Labs Unresulted Labs (From  admission, onward)     Start     Ordered   09/13/23 0500  Lipid panel  (Labs)  Tomorrow morning,   R       Comments: Fasting    09/12/23 1433   09/12/23 1224  Urine rapid drug screen (hosp performed)  Once,   STAT        09/12/23 1227   09/12/23 1218  Urinalysis, Routine w reflex microscopic -Urine, Clean Catch  Once,   URGENT       Question Answer Comment  Specimen Source Urine, Clean Catch   Release to patient Immediate      09/12/23 1218            Vitals/Pain Today's Vitals   09/12/23 1600 09/12/23 1638 09/12/23 1715 09/12/23 1819  BP: (!) 167/72  (!) 209/87 (!) 182/90  Pulse: (!) 54  68 70  Resp: 19  18 16   Temp:  98.8 F (37.1 C)    TempSrc:  Oral    SpO2: 96%  98% 97%  Weight:      Height:      PainSc: 0-No pain       Isolation Precautions No active isolations  Medications Medications   stroke: early stages of recovery book (has no administration in time range)  0.9 %  sodium chloride  infusion ( Intravenous New Bag/Given 09/12/23 1638)  acetaminophen  (TYLENOL ) tablet 650 mg (has no administration in time range)    Or  acetaminophen  (TYLENOL ) 160 MG/5ML solution 650 mg (has no administration in time range)    Or  acetaminophen  (TYLENOL ) suppository 650 mg (has no administration in time range)  senna-docusate (Senokot-S) tablet 1 tablet (has no administration in time range)  apixaban  (ELIQUIS ) tablet 5 mg (has no administration in time range)  rosuvastatin  (CRESTOR ) tablet 40 mg (has no administration in time range)  iohexol  (OMNIPAQUE ) 350 MG/ML injection 100 mL (100 mLs Intravenous Contrast Given 09/12/23 1433)    Mobility non-ambulatory     Focused Assessments Neuro Assessment Handoff:  Swallow screen pass? No    NIH Stroke Scale  Dizziness Present: No Headache Present: No Level of Consciousness (1a.)   : Alert, keenly responsive LOC Questions (1b. )   : Answers both questions correctly LOC Commands (1c. )   : Performs both tasks  correctly Best Gaze (2. )  : Normal Visual (3. )  : No visual loss Facial Palsy (4. )    : Complete paralysis of one or both sides Motor Arm, Left (5a. )   : Drift Motor Arm, Right (5b. ) : No drift Motor Leg, Left (6a. )  :  No drift Motor Leg, Right (6b. ) : No drift Limb Ataxia (7. ): Absent Sensory (8. )  : Mild-to-moderate sensory loss, patient feels pinprick is less sharp or is dull on the affected side, or there is a loss of superficial pain with pinprick, but patient is aware of being touched Best Language (9. )  : Severe aphasia Dysarthria (10. ): Severe dysarthria, patient's speech is so slurred as to be unintelligible in the absence of or out of proportion to any dysphasia, or is mute/anarthric Extinction/Inattention (11.)   : No Abnormality Complete NIHSS TOTAL: 9 Last date known well: 09/11/23 Last time known well: 1700 Neuro Assessment: Exceptions to WDL Neuro Checks:      Has TPA been given? No If patient is a Neuro Trauma and patient is going to OR before floor call report to 4N Charge nurse: [phone] or [phone]   R Recommendations: See Admitting Provider Note  Report given to:   Additional Notes:

## 2023-09-12 NOTE — Consult Note (Signed)
 NEUROLOGY CONSULT NOTE   Date of service: September 12, 2023 Patient Name: Mercedes [name] MRN:  [id number] DOB:  1955/08/31 Chief Complaint: "Speech difficulty, left-sided weakness" Requesting Provider: Trish Furl, MD  History of Present Illness  Mercedes [name] is a 68 y.o. female  has a past medical history of Abnormal LFTs, Anemia, Anxiety, Arnold-Chiari malformation, type I (HCC), CAD (coronary artery disease), DEPRESSION, Dizziness, GERD (gastroesophageal reflux disease), Headache, History of transmetatarsal amputation of left foot (HCC) (07/17/2019), HYPERLIPIDEMIA, HYPERTENSION, MI (myocardial infarction) (HCC), PAD (peripheral artery disease) (HCC), Paroxysmal atrial fibrillation (HCC), Presence of permanent cardiac pacemaker (10/2014), RA (rheumatoid arthritis) (HCC), Sjogren's syndrome (HCC) (06/02/2013), SLE (systemic lupus erythematosus) (HCC) (dx 05/2013), Stroke (HCC), Tachy-brady syndrome (HCC), 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   Currently on Eliquis -signigficant residual left-sided weakness from prior strokes documented in her last discharge summary from neurology, brought in for evaluation of strokelike symptoms.  Last known well 5 PM last night per report but no confirmation by the patient and no family member at bedside. Patient woke up this morning with slurred speech, difficulty talking and left-sided facial droop along with left-sided abnormal sensation and left arm weakness. Family not reachable at this time The case was discussed with me while I was seeing another patient in the ED. She is V/A/N positive per report-hence I asked a code stroke be activated.  Code stroke activation orders were put in and I evaluated the patient as a code stroke although no page was sent out. Her noncontrasted head CT did not reveal any acute abnormalities.  Once again, till this time, no further details on what the baseline deficits were and patient was also  not very clearly able to delineate what her deficits were and if they were better or worse, hence I did not pursue with further imaging after CT head.  I was able to finally get a hold of [name], granddaughter, [phone] who says that she was at her baseline last evening, had a fall from her wheelchair and was helped up by her brother who then left her house.  This morning, patient called her granddaughter and her speech was more slurred, nearly incomprehensible and when the granddaughter came to the house, her face was much more droopy than usual.  She reports that since the last stroke, she had regained a decent amount of strength back on that left side although she still needed help with bathing etc., more so due to bilateral amputations and subtle left-sided weakness and she preferred to live independently by herself-requiring help from caregiver and family members multiple times a day.  She continues to smoke heavily despite recommendations to quit by her cardiologist, neurologist and PCP.  With this new information, further advanced imaging was performed.  See further details on the case below  LKW: 5 PM 09/11/2023 Modified rankin score: 3-4 IV Thrombolysis: Outside the window EVT: Poor MR S  NIHSS components Score: Comment  1a Level of Conscious 0[x]  1[]  2[]  3[]      1b LOC Questions 0[x]  1[]  2[]       1c LOC Commands 0[x]  1[]  2[]       2 Best Gaze 0[x]  1[]  2[]       3 Visual 0[x]  1[]  2[]  3[]      4 Facial Palsy 0[]  1[]  2[x]  3[]      5a Motor Arm - left 0[]  1[]  2[x]  3[]  4[]  UN[]    5b Motor Arm - Right 0[x]  1[]  2[]  3[]  4[]  UN[]    6a  Motor Leg - Left 0[x]  1[]  2[]  3[]  4[]  UN[]    6b Motor Leg - Right 0[x]  1[]  2[]  3[]  4[]  UN[]    7 Limb Ataxia 0[x]  1[]  2[]  3[]  UN[]     8 Sensory 0[]  1[]  2[x]  UN[]      9 Best Language 0[]  1[x]  2[]  3[]      10 Dysarthria 0[]  1[]  2[x]  UN[]      11 Extinct. and Inattention 0[x]  1[]  2[]       TOTAL: 9       ROS  Unable to ascertain due to her significant  dysarthria  Past History   Past Medical History:  Diagnosis Date   Abnormal LFTs    a. in the past when on Lipitor    Anemia    as a young woman   Anxiety    Arnold-Chiari malformation, type I (HCC)    MRI brain 02/2010   CAD (coronary artery disease)    a. NSTEMI 07/2009: LHC - D2 40%, LAD irreg., EF 50% with apical AK (tako-tsubo CM);  b. Inf STEMI (04/2013): Tx Promus DES to CFX;  c. 08/2013 Lexi CL: No ischemia, dist ant/ap/inf-ap infarct, EF  d. 06/2016 NSTEMI with LM disease: s/p CABG on 07/17/2016 w/ LIMA-LAD, SVG-OM, and SVG-dRCA.   DEPRESSION    Dizziness    ? 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 Alva Jewels   Sjogren's syndrome (HCC) 06/02/2013   pt denies this (12/01/13)   SLE (systemic lupus erythematosus) (HCC) dx 05/2013   follows with rheum Alva Jewels)   Stroke [facility])    March 2023, weakness in right arm is improving   Tachy-brady syndrome (HCC)    a. 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   f/u echo 09/2009: EF 50-55%, mild LVH, mod diast dysfxn, mild apical HK   Wears dentures    Wears glasses     Past Surgical History:  Procedure Laterality Date   ABDOMINAL AORTAGRAM N/A 11/24/2013   Procedure: ABDOMINAL AORTAGRAM WITH BILATERAL RUNOFF WITH POSSIBLE INTERVENTION;  Surgeon: Dannis Dy, MD;  Location[facility] CATH LAB;  Service: Cardiovascular;  Laterality: N/A;   ABDOMINAL AORTOGRAM W/LOWER EXTREMITY  N/A 04/25/2019   Procedure: ABDOMINAL AORTOGRAM W/LOWER EXTREMITY;  Surgeon: Richrd Char, MD;  Location: [facility] INVASIVE CV LAB;  Service: Cardiovascular;  Laterality: N/A;   ABDOMINAL AORTOGRAM W/LOWER EXTREMITY Bilateral 05/15/2019   Procedure: ABDOMINAL AORTOGRAM W/LOWER EXTREMITY;  Surgeon: Margherita Shell, MD;  Location: [facility] INVASIVE CV LAB;  Service: Cardiovascular;  Laterality: Bilateral;   ABDOMINAL AORTOGRAM W/LOWER EXTREMITY N/A 08/23/2020   Procedure: ABDOMINAL AORTOGRAM W/LOWER EXTREMITY;  Surgeon: Adine Hoof, MD;  Location: [facility] INVASIVE CV LAB;  Service: Cardiovascular;  Laterality: N/A;   ABDOMINAL AORTOGRAM W/LOWER EXTREMITY N/A 10/29/2020   Procedure: ABDOMINAL AORTOGRAM W/LOWER EXTREMITY;  Surgeon: Richrd Char, MD;  Location: [facility] INVASIVE CV LAB;  Service: Cardiovascular;  Laterality: N/A;   AMPUTATION Left  12/02/2013   Procedure: LEFT 1ST TOE AMPUTATION;  Surgeon: Richrd Char, MD;  Location: [facility] OR;  Service: Vascular;  Laterality: Left;   AMPUTATION Left 07/16/2019   Procedure: LEFT TRANSMETATARSAL AMPUTATION;  Surgeon: Timothy Ford, MD;  Location: [facility] OR;  Service: Orthopedics;  Laterality: Left;   AMPUTATION Left 10/01/2019   Procedure: LEFT AMPUTATION ABOVE KNEE;  Surgeon: Richrd Char, MD;  Location: [facility] OR;  Service: Vascular;  Laterality: Left;   AMPUTATION Right 08/31/2020   Procedure: Right first toe amputation;  Surgeon: Richrd Char, MD;  Location: [facility] OR;  Service: Vascular;  Laterality: Right;   AMPUTATION Right 11/23/2020   Procedure: RIGHT AMPUTATION ABOVE KNEE;  Surgeon: Richrd Char, MD;  Location: [facility] OR;  Service: Vascular;  Laterality: Right;   AMPUTATION Left 03/14/2022   Procedure: REVISION LEFT ABOVE KNEE AMPUTATION;  Surgeon: Adine Hoof, MD;  Location: [facility] OR;  Service: Vascular;  Laterality: Left;   APPLICATION OF WOUND VAC Left 11/03/2019   Procedure: Application Of Wound Vac, left lower extremity;  Surgeon:  Richrd Char, MD;  Location: [facility] OR;  Service: Vascular;  Laterality: Left;   CARDIAC CATHETERIZATION N/A 07/12/2016   Procedure: Left Heart Cath and Coronary Angiography;  Surgeon: Millicent Ally, MD;  Location: [facility] INVASIVE CV LAB;  Service: Cardiovascular;  Laterality: N/A;   CARDIAC CATHETERIZATION N/A 07/14/2016   Procedure: Intravascular Ultrasound/IVUS;  Surgeon: Sammy Crisp, MD;  Location: [facility] INVASIVE CV LAB;  Service: Cardiovascular;  Laterality: N/A;   COLONOSCOPY     CORONARY ANGIOPLASTY  04/2013   CORONARY ARTERY BYPASS GRAFT N/A 07/17/2016   Procedure: CORONARY ARTERY BYPASS GRAFTING (CABG)x3 using right greater saphenous vein harvested endoscopiclly and left internal mammary artery;  Surgeon: Heriberto London, MD;  Location: [facility] OR;  Service: Open Heart Surgery;  Laterality: N/A;   ENDARTERECTOMY FEMORAL Left 05/12/2019   Procedure: LEFT Femoral Endarterectomy;  Surgeon: Richrd Char, MD;  Location: [facility] OR;  Service: Vascular;  Laterality: Left;   FEMORAL-POPLITEAL BYPASS GRAFT Left 12/02/2013   Procedure:  LEFT FEMORAL-BELOW KNEE POPLITEAL ARTERY BYPASS GRAFT;  Surgeon: Richrd Char, MD;  Location: [facility] OR;  Service: Vascular;  Laterality: Left;   FEMORAL-POPLITEAL BYPASS GRAFT Left 09/25/2019   Procedure: THROMBECTOMY and PROPATEN BYPASS  FEMORAL TO BELOW KNEE POPLITEAL ARTERY BYPASS GRAFT LEFT LEG;  Surgeon: Mayo Speck, MD;  Location: [facility] OR;  Service: Vascular;  Laterality: Left;   FEMORAL-POPLITEAL BYPASS GRAFT Right 08/24/2020   Procedure: RIGHT FEMORAL-BELOW KNEE POPLITEAL ARTERY BYPASS USING GORE PROPATEN GRAFT;  Surgeon: Adine Hoof, MD;  Location: [facility] OR;  Service: Vascular;  Laterality: Right;   FEMORAL-POPLITEAL BYPASS GRAFT Right 11/01/2020   Procedure: Thrombectomy and Revision of Right Femoral to below knee Bypass with Interpostional graft to Tibial / peroneal Trunk and Endarterectomy Tibial Bartholome Boron Trunk.;  Surgeon: Richrd Char, MD;   Location: [facility] OR;  Service: Vascular;  Laterality: Right;   INTRAOPERATIVE ARTERIOGRAM Right 11/01/2020   Procedure: ANTEGRADE RIGHT LOWER EXTREMITY INTRA OPERATIVE ARTERIOGRAM;  Surgeon: Richrd Char, MD;  Location: [facility] OR;  Service: Vascular;  Laterality: Right;   IR CT HEAD LTD  08/28/2022   IR PERCUTANEOUS ART THROMBECTOMY/INFUSION INTRACRANIAL INC DIAG ANGIO  08/28/2022   IR US  GUIDE VASC ACCESS RIGHT  08/28/2022   LEFT HEART CATHETERIZATION WITH CORONARY ANGIOGRAM N/A 05/19/2013   Procedure: LEFT HEART CATHETERIZATION WITH CORONARY ANGIOGRAM;  Surgeon: Arlander Bellman, MD;  Location: [facility] CATH LAB;  Service:  Cardiovascular;  Laterality: N/A;   LEFT HEART CATHETERIZATION WITH CORONARY ANGIOGRAM N/A 10/28/2014   Procedure: LEFT HEART CATHETERIZATION WITH CORONARY ANGIOGRAM;  Surgeon: Wenona Hamilton, MD;  Location: [facility] CATH LAB;  Service: Cardiovascular;  Laterality: N/A;   LOWER EXTREMITY ANGIOGRAM Left 05/12/2019   Procedure: Lower Extremity Angiogram;  Surgeon: Richrd Char, MD;  Location: [facility] OR;  Service: Vascular;  Laterality: Left;   PATCH ANGIOPLASTY Left 05/12/2019   Procedure: Left Femoral Patch Angioplasty USING CADAVERIC SAPHENOUS VEIN;  Surgeon: Richrd Char, MD;  Location: [facility] OR;  Service: Vascular;  Laterality: Left;   PERCUTANEOUS CORONARY STENT INTERVENTION (PCI-S)  05/19/2013   Procedure: PERCUTANEOUS CORONARY STENT INTERVENTION (PCI-S);  Surgeon: Arlander Bellman, MD;  Location: [facility] CATH LAB;  Service: Cardiovascular;;   PERIPHERAL VASCULAR INTERVENTION Right 10/29/2020   Procedure: PERIPHERAL VASCULAR INTERVENTION;  Surgeon: Richrd Char, MD;  Location: [facility] INVASIVE CV LAB;  Service: Cardiovascular;  Laterality: Right;  common iliac   PERMANENT PACEMAKER INSERTION N/A 11/04/2014   STJ Leadless pacemaker implanted by Dr Nunzio Belch for tachy/brady   RADIOLOGY WITH ANESTHESIA N/A 08/28/2022   Procedure: RADIOLOGY WITH ANESTHESIA;  Surgeon: Radiologist, Medication, MD;  Location: [facility]  OR;  Service: Radiology;  Laterality: N/A;   TEE WITHOUT CARDIOVERSION N/A 07/17/2016   Procedure: TRANSESOPHAGEAL ECHOCARDIOGRAM (TEE);  Surgeon: Heriberto London, MD;  Location: [facility] OR;  Service: Open Heart Surgery;  Laterality: N/A;   THROMBECTOMY FEMORAL ARTERY Left 05/12/2019   Procedure: Left Ilio-Femoral Artery and Femoral-popliteal Graft Thrombectomy;  Surgeon: Richrd Char, MD;  Location: [facility] OR;  Service: Vascular;  Laterality: Left;   THROMBECTOMY OF BYPASS GRAFT FEMORAL- POPLITEAL ARTERY Right 11/01/2020   Procedure: POSSIBLE THROMBOLYSIS VERSUS OPEN THROMBECTOMY AND REVSION OF RIGHT FEMORAL-POPLITEAL ARTERY BYPASS;  Surgeon: Richrd Char, MD;  Location: [facility] OR;  Service: Vascular;  Laterality: Right;   TUBAL LIGATION     VISCERAL ANGIOGRAPHY N/A 04/25/2019   Procedure: MESENTRIC  ANGIOGRAPHY;  Surgeon: Richrd Char, MD;  Location: [facility] INVASIVE CV LAB;  Service: Cardiovascular;  Laterality: N/A;   WOUND DEBRIDEMENT Left 11/03/2019   Procedure: Debridement Wound of left above the knee amputation;  Surgeon: Richrd Char, MD;  Location: [facility] OR;  Service: Vascular;  Laterality: Left;    Family History: Family History  Problem Relation Age of Onset   Arthritis Mother    Emphysema Mother    Arthritis Father    COPD Father    Heart disease Father    Diabetes Paternal Aunt        paternal Aunts   Heart disease Paternal Aunt    Thyroid  disease Neg Hx     Social History  reports that she quit smoking about 6 years ago. Her smoking use included cigarettes. She has never used smokeless tobacco. She reports that she does not currently use drugs after having used the following drugs: Marijuana and Other-see comments. She reports that she does not drink alcohol.  Allergies  Allergen Reactions   Brilinta  [Ticagrelor ] Anaphylaxis, Shortness Of Breath and Other (See Comments)    Also, chest tightness   Latex Rash   Tape Rash    Medications  No current facility-administered  medications for this encounter.  Current Outpatient Medications:    acetaminophen  (TYLENOL ) 500 MG tablet, Take by mouth., Disp: , Rfl:    ELIQUIS  5 MG TABS tablet, TAKE 1 TABLET BY MOUTH TWICE  DAILY, Disp: 180 tablet, Rfl: 3   mupirocin  ointment (BACTROBAN ) 2 %, Apply  1 Application topically 2 (two) times daily., Disp: 22 g, Rfl: 0   NIFEdipine  (ADALAT  CC) 30 MG 24 hr tablet, Take 30 mg by mouth daily., Disp: , Rfl:    nitroGLYCERIN  (NITROSTAT ) 0.4 MG SL tablet, Place 1 tablet (0.4 mg total) under the tongue every 5 (five) minutes as needed for chest pain., Disp: 25 tablet, Rfl: 11   polyethylene glycol (MIRALAX  / GLYCOLAX ) 17 g packet, Take 17 g by mouth daily., Disp: 14 each, Rfl: 0   rosuvastatin  (CRESTOR ) 40 MG tablet, Take 1 tablet (40 mg total) by mouth daily. (Patient not taking: Reported on 07/31/2023), Disp: 90 tablet, Rfl: 3   senna-docusate (SENOKOT-S) 8.6-50 MG tablet, Take 1 tablet by mouth at bedtime as needed for mild constipation., Disp: 14 tablet, Rfl: 0  Vitals   Vitals:   2023-09-14 1213 September 14, 2023 1215 09/14/23 1217  BP:  (!) 186/100   Pulse:  (!) 59   Resp:  15   Temp:   98.5 F (36.9 C)  TempSrc:   Oral  SpO2:  97%   Weight: 41 kg    Height: 5\' 5"  (1.651 m)      Body mass index is 15.04 kg/m.  Physical Exam   General: Well-developed well-nourished in no acute distress HEENT: Normocephalic atraumatic Neck: Clear Extremities: Bilateral AKA's. Neurological exam Awake alert severely dysarthric, no gross aphasia but she was not able to describe the pictures on the stroke card completely.  She also has diminished attention concentration. Cranial nerves: Pupils equal round react light, extraocular movements intact but has mild right gaze preference but is able to look to the left, visual fields full, significant left lower facial droop. Motor examination with barely antigravity left upper extremity strength 3/5.  Right side upper extremity full strength.  Bilateral  stumps antigravity. Sensation diminished to the left Coordination difficult to assess    Labs/Imaging/Neurodiagnostic studies   CBC:  Recent Labs  Lab 09/14/2023 1225 09/14/2023 1232  WBC 4.4  --   NEUTROABS 2.4  --   HGB 12.5 13.9  HCT 40.7 41.0  MCV 77.7*  --   PLT 335  --    Basic Metabolic Panel:  Lab Results  Component Value Date   NA 139 09/14/2023   K 3.9 2023/09/14   CO2 25 01/13/2023   GLUCOSE 97 09/14/23   BUN 10 2023-09-14   CREATININE 0.90 September 14, 2023   CALCIUM  10.1 01/13/2023   GFRNONAA >60 01/13/2023   GFRAA >60 11/24/2019   Lipid Panel:  Lab Results  Component Value Date   LDLCALC 82 08/29/2022   HgbA1c:  Lab Results  Component Value Date   HGBA1C 6.6 (H) 07/05/2022   Urine Drug Screen:     Component Value Date/Time   LABOPIA NONE DETECTED 11/21/2021 2230   COCAINSCRNUR NONE DETECTED 11/21/2021 2230   COCAINSCRNUR NEG 08/26/2009 2126   LABBENZ NONE DETECTED 11/21/2021 2230   LABBENZ NEG 08/26/2009 2126   AMPHETMU NONE DETECTED 11/21/2021 2230   THCU NONE DETECTED 11/21/2021 2230   LABBARB NONE DETECTED 11/21/2021 2230    Alcohol Level     Component Value Date/Time   ETH <10 08/28/2022 1738   INR  Lab Results  Component Value Date   INR 1.0 08/28/2022   APTT  Lab Results  Component Value Date   APTT 27 08/28/2022   CT Head without contrast(Personally reviewed): No acute changes  CT angio Head and Neck with contrast, CT perfusion study (Personally reviewed): Right M2 occlusion, 129 cc core.  ASSESSMENT   LOANNA GRANDISON is a 68 y.o. female  has a past medical history of Abnormal LFTs, Anemia, Anxiety, Arnold-Chiari malformation, type I (HCC), CAD (coronary artery disease), DEPRESSION, Dizziness, GERD (gastroesophageal reflux disease), Headache, History of transmetatarsal amputation of left foot (HCC) (07/17/2019), HYPERLIPIDEMIA, HYPERTENSION, MI (myocardial infarction) (HCC), PAD (peripheral artery disease) (HCC), Paroxysmal  atrial fibrillation (HCC), Presence of permanent cardiac pacemaker (10/2014), RA (rheumatoid arthritis) (HCC), Sjogren's syndrome (HCC) (06/02/2013), SLE (systemic lupus erythematosus) (HCC) (dx 05/2013), Stroke (HCC), Tachy-brady syndrome (HCC), 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  Residual left-sided weakness from prior stroke, on Eliquis , last known well 5 PM yesterday, brought in for concern for worsening left-sided weakness-initially no one to provide reliable history or confirm LKW or extent of residual deficits but finally was able to get in touch with granddaughter who reports worsening of the previously existing symptoms of left-sided weakness noted by family was the reason for EMS call.  Granddaughter does report that patient had significant left-sided weakness after the last stroke for which she had gotten thrombectomy, but she has continued to smoke after that and the symptoms currently visible are much worse than her baseline symptoms where she had gotten to a point where she could feed herself, although she still require help with bathing due to some left sided weakness and b/l amputations.  Based on the new information, I ordered a stat CT angio and perfusion although she still remains not a good candidate for thrombectomy with a modified Rankin score of 3-4 but since she has lived rather independently by her choice, it might be worthwhile to explore if there is any intervention possibility. She cannot get a stat MRI due to noncompatible leadless pacemaker. CT angio head and neck and CT perfusion study consistent with a right M2 occlusion and a large penumbra  Case discussed with neurointerventional list.  Due to some attention and concentration deficits as well as question on patient's comprehension being up to par, family was contacted-granddaughter Colie Dawes, risk benefits and alternatives of thrombectomy discussed and she agreed to proceed.  Patient was  taken for thrombectomy to the IR suite where I received a call back that she is refusing to get the procedure done  We had a detailed discussion over the phone-neuro IR Dr. Alvira Josephs, anesthesiologist, CRNA, IR RN on our side and the patient's sister, daughter, brother and granddaughter on the other side with a conference call.  The interventionalist and I described her deficits and the rationale for proceeding with the procedure.  The family members seemed to be in alignment with proceeding but also said that she had indicated to them that she has had enough and would not want any procedures done but they also think that she was not able to make fully informed decisions and they all were leaning towards proceeding but they did not have consensus.  After about 15 to 20 minutes of detailed conversations, none of the family members was ready to take a decision on behalf of the patient.  The CRNA and the RN requested that we reassess the patient.  The patient was awake alert oriented x 3.  She was able to say that she understands that if she does not get the procedure, she may end up being disabled and lose her independence and ability to live independently and be in a nursing home-to which she wrote on a piece of paper with her right hand "been to 1, okay to be in 1".  At  that time, decision was [facility] the discussion with the neurointerventionalist and anesthetist that the patient is able to make decisions on her behalf and is refusing the procedure after understanding the risks benefits and alternatives and the plan to proceed with the thrombectomy was discontinued at the time.  Family was notified with a phone call to the granddaughter afterwards  Recommend admission for stroke risk factor workup and therapy assessments   Impression: Acute ischemic stroke due to right M2 occlusion, patient refused thrombectomy, atrial fibrillation, current tobacco abuse, bilateral AKA's  RECOMMENDATIONS  Admit to  hospitalist Frequent neurochecks Unclear of compliance to Eliquis .  Will hold Eliquis  for now. Aspirin  81 for now MRI brain without contrast is not possible due to a noncompliant pacemaker Repeat head CT tomorrow Check A1c, 2D echo, lipid panel. Therapy assessments-PT OT speech therapy Permissive hypertension-allow blood pressure to be as high as 220 and treat only on a as needed basis when higher than 220 for the next 24 to 48 hours. Stroke team to follow   Special note: Delays in the procedure: Gathering history from family, consenting process which was long drawn without clear consensus from the family and ultimately leaning on patient's decision making capacity and mention from the family that she did not want any procedures done.  No clear H POA was discernible after long conversations   ______________________________________________________________________    Signed, Tona Francis, MD [facility]   CRITICAL CARE ATTESTATION Performed by: Tona Francis, MD Total critical care time: 90 minutes Critical care time was exclusive of separately billable procedures and treating other patients and/or supervising APPs/Residents/Students Critical care was necessary to treat or prevent imminent or life-threatening deterioration. This patient is critically ill and at significant risk for neurological worsening and/or death and care requires constant monitoring. Critical care was time spent personally by me on the following activities: development of treatment plan with patient and/or surrogate as well as nursing, discussions with consultants, evaluation of patient's response to treatment, examination of patient, obtaining history from patient or surrogate, ordering and performing treatments and interventions, ordering and review of laboratory studies, ordering and review of radiographic studies, pulse oximetry, re-evaluation of patient's condition, participation in multidisciplinary  rounds and medical decision making of high complexity in the care of this patient.

## 2023-09-12 NOTE — ED Provider Notes (Signed)
  Physical Exam  BP (!) 167/72 (BP Location: Right Arm)   Pulse (!) 54   Temp 98.5 F (36.9 C) (Oral)   Resp 19   Ht 5\' 5"  (1.651 m)   Wt 41 kg   SpO2 96%   BMI 15.04 kg/m   Physical Exam  Procedures  Procedures  ED Course / MDM    Medical Decision Making Amount and/or Complexity of Data Reviewed Labs: ordered. Radiology: ordered.  Risk Decision regarding hospitalization.   Patient signed out at end of shift awaiting admission for acute CVA  Acute cva Refused thrombectomy today Admit to medicine for stroke work up  Discussed with hospitalist who accepts for admission.         Mandy Second, PA-C 09/12/23 1607    Trish Furl, MD 09/12/23 [phone]

## 2023-09-12 NOTE — ED Notes (Signed)
 SLP at bedside.

## 2023-09-12 NOTE — ED Triage Notes (Signed)
 Pt coming in from home . Pt coming in with stroke like symptoms. Last known well was 5pm last night.  Pt woke up this morning with slurred speech aphasia left sided facial droop and abnormal sensation to left side of face. And left arm weakness. Pt normally has no issues communicating. Pt has a history of stroke on the left side. Pt reports history of resolved left sided weakness.   Bp 195/98 Hr 68 Rr 16 Spo2 97% on ra Cbg 138

## 2023-09-12 NOTE — Transfer of Care (Deleted)
Immediate Anesthesia Transfer of Care Note  Patient: Mercedes [name]  Procedure(s) Performed: RADIOLOGY WITH ANESTHESIA  Patient Location: Emergency dept   Anesthesia Type: Nothing  Level of Consciousness: AXOX3 but aphasic   Airway & Oxygen Therapy: Patient Spontanous Breathing  Post-op Assessment:  Report given to IR nurse who was taking patient back to the ER. We did not perform the procedure.   Post vital signs: Reviewed and stable  Last Vitals:  Vitals Value Taken Time  BP    Temp    Pulse 65 09/12/23 1623  Resp 12 09/12/23 1623  SpO2 98 % 09/12/23 1623  Vitals shown include unfiled device data.  Last Pain:  Vitals:   09/12/23 1600  TempSrc:   PainSc: 0-No pain         Complications: No notable events documented.

## 2023-09-13 ENCOUNTER — Inpatient Hospital Stay (HOSPITAL_COMMUNITY): Payer: 59

## 2023-09-13 ENCOUNTER — Encounter (HOSPITAL_COMMUNITY): Payer: Self-pay | Admitting: Radiology

## 2023-09-13 DIAGNOSIS — I63511 Cerebral infarction due to unspecified occlusion or stenosis of right middle cerebral artery: Secondary | ICD-10-CM | POA: Diagnosis not present

## 2023-09-13 LAB — ECHOCARDIOGRAM COMPLETE
AR max vel: 2.84 cm2
AV Area VTI: 2.77 cm2
AV Area mean vel: 2.52 cm2
AV Mean grad: 2 mm[Hg]
AV Peak grad: 3.7 mm[Hg]
Ao pk vel: 0.96 m/s
Area-P 1/2: 3.6 cm2
Height: 65 in
MV VTI: 2.31 cm2
S' Lateral: 3.3 cm
Weight: 1446.22 [oz_av]

## 2023-09-13 LAB — LIPID PANEL
Cholesterol: 141 mg/dL (ref 0–200)
HDL: 40 mg/dL — ABNORMAL LOW (ref >40)
LDL Cholesterol: 83 mg/dL (ref 0–99)
Total CHOL/HDL Ratio: 3.5 {ratio}
Triglycerides: 90 mg/dL (ref <150)
VLDL: 18 mg/dL (ref 0–40)

## 2023-09-13 MED ORDER — ORAL CARE MOUTH RINSE
15.0000 mL | OROMUCOSAL | Status: DC
  Administered 2023-09-13 – 2023-09-17 (×11): 15 mL via OROMUCOSAL

## 2023-09-13 MED ORDER — APIXABAN 5 MG PO TABS
5.0000 mg | ORAL_TABLET | Freq: Two times a day (BID) | ORAL | Status: DC
  Administered 2023-09-13 – 2023-09-17 (×8): 5 mg via ORAL
  Filled 2023-09-13 (×8): qty 1

## 2023-09-13 MED ORDER — KETOROLAC TROMETHAMINE 15 MG/ML IJ SOLN: 15.0000 mg | Freq: Once | INTRAMUSCULAR | Status: AC | PRN

## 2023-09-13 MED ORDER — ORAL CARE MOUTH RINSE: 15.0000 mL | OROMUCOSAL | Status: DC | PRN

## 2023-09-13 MED ORDER — EZETIMIBE 10 MG PO TABS
10.0000 mg | ORAL_TABLET | Freq: Every day | ORAL | Status: DC
  Administered 2023-09-13 – 2023-09-17 (×5): 10 mg via ORAL
  Filled 2023-09-13 (×5): qty 1

## 2023-09-13 NOTE — Progress Notes (Signed)
PT Cancellation Note  Patient Details Name: Mercedes [name] MRN: [id number] DOB: 06-08-1956   Cancelled Treatment:    Reason Eval/Treat Not Completed: Patient at procedure or test/unavailable  Pt off the floor. Will continue attempts   Jerolyn Center, PT Acute Rehabilitation Services  Office [phone]   Zena Amos 09/13/2023, 9:55 AM

## 2023-09-13 NOTE — Plan of Care (Signed)
  Problem: Education: Goal: Knowledge of disease or condition will improve Outcome: Progressing Goal: Knowledge of secondary prevention will improve (MUST DOCUMENT ALL) Outcome: Progressing Goal: Knowledge of patient specific risk factors will improve (DELETE if not current risk factor) Outcome: Progressing   Problem: Ischemic Stroke/TIA Tissue Perfusion: Goal: Complications of ischemic stroke/TIA will be minimized Outcome: Progressing   Problem: Coping: Goal: Will verbalize positive feelings about self Outcome: Progressing Goal: Will identify appropriate support needs Outcome: Progressing   Problem: Health Behavior/Discharge Planning: Goal: Ability to manage health-related needs will improve Outcome: Progressing Goal: Goals will be collaboratively established with patient/family Outcome: Progressing   Problem: Self-Care: Goal: Ability to participate in self-care as condition permits will improve Outcome: Progressing Goal: Verbalization of feelings and concerns over difficulty with self-care will improve Outcome: Progressing Goal: Ability to communicate needs accurately will improve Outcome: Progressing   Problem: Nutrition: Goal: Risk of aspiration will decrease Outcome: Progressing Goal: Dietary intake will improve Outcome: Progressing   Problem: Education: Goal: Knowledge of General Education information will improve Description: Including pain rating scale, medication(s)/side effects and non-pharmacologic comfort measures Outcome: Progressing   Problem: Health Behavior/Discharge Planning: Goal: Ability to manage health-related needs will improve Outcome: Progressing

## 2023-09-13 NOTE — Progress Notes (Signed)
Modified Barium Swallow Study  Patient Details  Name: Mercedes [name] MRN: [id number] Date of Birth: Apr 07, 1956  Today's Date: 09/13/2023  Modified Barium Swallow completed.  Full report located under Chart Review in the Imaging Section.  History of Present Illness Mercedes [name] is a 68 yo female presenting to ED 1/15 with slurred speech, L facial droop, and L sided weakness/numbness. CTA shows R MCA and ACA occlusions and infarct core in R posterior frontal periventricular white matter with penumbra throughout the posterior R MCA. Was recommended to undergo emergent thrombectomy but pt and her family refused. Previously seen by SLP January 2024 with oropharyngeal dysphagia and cognitive deficits. MBS 09/13/22 showed continued aspiration of thin liquids and recommended a Dys 2 diet with nectar thick liquid. 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 Pt demonstrates a primary oral dysphagia with significant left CN VII and CN XII weakness. Pt unable to achieve volitional lingual movement, but uses secondary movement, rocking, posterior tilt and face wiping to elicit sensation and subsequent movement. Her impairments are again severe, but seem to be similar in nature to prior admissions and strokes, so she is quickly using strategies that have worked in the past. Pt has anterior loss and oral residue with liquids and thin and nectar thick liquids are aspirated before/during the swallow due to slightly late airway closure. Pt senses and ejects larger volume aspirate. Controlling bolus size with teaspoon is again most effective. There is no pharyngeal residue. When given honey and puree teaspoon pt has a more  cohesive oral phase and there is no penetraiton or aspiration. Pt seems to improve as study progresses. Will benefit from initaiting a conservative diet for practice puree/honey but has good potential to upgrade as she has done in the past. She is unhappy about purees and wants more texture in the future, but during the study, increased texture significantly delayed oral transit and increased oral residue. Factors that may increase risk of adverse event in presence of aspiration Mercedes [name] 2021):    Swallow Evaluation Recommendations Recommendations: PO diet PO Diet Recommendation: Dysphagia 1 (Pureed);Moderately thick liquids (Level 3, honey thick) Liquid Administration via: Spoon Medication Administration: Crushed with puree Supervision: Staff to assist with self-feeding Swallowing strategies  : Slow rate;Small bites/sips;Check for pocketing or oral holding;Check for anterior loss Postural changes: Position pt fully upright for meals;Stay upright 30-60 min after meals Oral care recommendations: Oral care before PO Caregiver Recommendations: Have oral suction available      Mercedes [name], Mercedes [name] 09/13/2023,10:29 AM

## 2023-09-13 NOTE — Progress Notes (Signed)
PROGRESS NOTE    Mercedes [name]  ZOX:[id number] DOB: February 22, 1956 DOA: 09/12/2023 PCP: Pincus Sanes, MD    Brief Narrative:  68 year old female with extensive medical history including history of stroke, hypertension, hyperlipidemia, coronary artery disease status post CABG, history of Takotsubo cardiomyopathy, peripheral artery disease status post bypass and now with bilateral above-knee amputation, atrial fibrillation, tachybradycardia syndrome status post pacemaker, type 2 diabetes, Sjogren's syndrome, seizure disorder, hypothyroidism and GERD presented from home with focal neurological deficits.  Patient lives at home and reported falling out of chair 1 day prior to admit.  In the morning, she spoke with her granddaughter who noted unintelligible speech, granddaughter came home to see her and found with profound weakness and left-sided facial droop and dysarthria so EMS was called.  Patient was brought to the emergency room as code stroke.  Thrombectomy was suggested for right M2, after multiple discussions this was not carried out.  Admitted for supportive care and further stroke workup.  Subjective: Patient seen and examined.  Her grandson was at the bedside.  Patient is alert awake.  Not able to talk.  She is able to write with her right hand.  Did not like pure.  Understands the circumstances she had a stroke and agreeable to referral to a skilled nursing facility.  Assessment & Plan:   Acute right MCA territory stroke: Clinical findings, left-sided deficits, facial droop, arm weakness and numbness.  Dysarthria. CTA head and neck, occlusion of right M2 and A2 with infarct. MRI of the brain, unable to do because of incompatible MRI. 2D echocardiogram, normal ejection fraction.  Regional wall motion abnormality which is likely chronic.  No intracardiac thrombus. Antiplatelet therapy, on Eliquis at home.  Continued. LDL 83, started on Crestor 40 mg daily. Hemoglobin A1c, 6.  No  indication for treatment. Therapy recommendations pending. Patient was not a tPA candidate because of unknown duration, thrombectomy was suggested however declined. Significant dysarthria, seen by speech therapy, started on dysphagia 1 diet with honey thick liquid.  Will start medications by mouth. Will probably need nursing home placement.  Essential hypertension blood pressures stable.  Holding antihypertensives. Hyperlipidemia: As above.  On Crestor. Coronary artery disease status post CABG: On statin, currently on Eliquis. History of A-fib: Sinus rhythm.  On Eliquis.  Has pacemaker due to tachybradycardia syndrome. Peripheral artery disease: Status post bypass, subsequent bilateral BKA.  On Eliquis and rosuvastatin.     DVT prophylaxis: SCD's Start: 09/12/23 1433 apixaban (ELIQUIS) tablet 5 mg   Code Status: Full code Family Communication: Grandson at the bedside Disposition Plan: Status is: Inpatient Remains inpatient appropriate because: Stroke workup     Consultants:  Neurology  Procedures:  None  Antimicrobials:  None     Objective: Vitals:   09/12/23 2328 09/13/23 0340 09/13/23 0738 09/13/23 1109  BP: (!) 173/106 (!) 162/78 (!) 171/99 (!) 158/94  Pulse: (!) 106 61 82 75  Resp: 18 16 18 17   Temp: 98 F (36.7 C) 98 F (36.7 C) 98.9 F (37.2 C) 98.1 F (36.7 C)  TempSrc: Oral Oral Oral Oral  SpO2: 91% 98% 98% 100%  Weight:      Height:        Intake/Output Summary (Last 24 hours) at 09/13/2023 1128 Last data filed at 09/13/2023 0311 Gross per 24 hour  Intake 417.19 ml  Output --  Net 417.19 ml   Filed Weights   09/12/23 1213  Weight: 41 kg    Examination:  General: Chronically sick looking.  Not in distress.  On room air. Cardiovascular: S1-S2 normal.  Regular rate rhythm. Respiratory: Bilateral clear.  No added sounds. Gastrointestinal: Soft.  Nontender.  Bowel sound present. Ext: No swelling or edema. Neuro: Alert awake and oriented.   Profoundly dysarthric. She can move right upper extremity and amputated right lower extremity with normal power. Left upper extremity 0/5, left lower extremity that has a bony amputation stump she can move at the hip joint with 3/5.   Data Reviewed: I have personally reviewed following labs and imaging studies  CBC: Recent Labs  Lab 09/12/23 1225 09/12/23 1232  WBC 4.4  --   NEUTROABS 2.4  --   HGB 12.5 13.9  HCT 40.7 41.0  MCV 77.7*  --   PLT 335  --    Basic Metabolic Panel: Recent Labs  Lab 09/12/23 1225 09/12/23 1232  NA 138 139  K 3.9 3.9  CL 107 108  CO2 23  --   GLUCOSE 99 97  BUN 9 10  CREATININE 0.89 0.90  CALCIUM 9.0  --    GFR: Estimated Creatinine Clearance: 38.2 mL/min (by C-G formula based on SCr of 0.9 mg/dL). Liver Function Tests: Recent Labs  Lab 09/12/23 1225  AST 18  ALT 7  ALKPHOS 71  BILITOT 0.7  PROT 7.6  ALBUMIN 2.8*   No results for input(s): "LIPASE", "AMYLASE" in the last 168 hours. No results for input(s): "AMMONIA" in the last 168 hours. Coagulation Profile: Recent Labs  Lab 09/12/23 1225  INR 1.1   Cardiac Enzymes: No results for input(s): "CKTOTAL", "CKMB", "CKMBINDEX", "TROPONINI" in the last 168 hours. BNP (last 3 results) No results for input(s): "PROBNP" in the last 8760 hours. HbA1C: Recent Labs    09/12/23 1225  HGBA1C 6.0*   CBG: Recent Labs  Lab 09/12/23 1215 09/12/23 1223  GLUCAP 98 91   Lipid Profile: Recent Labs    09/13/23 0611  CHOL 141  HDL 40*  LDLCALC 83  TRIG 90  CHOLHDL 3.5   Thyroid Function Tests: No results for input(s): "TSH", "T4TOTAL", "FREET4", "T3FREE", "THYROIDAB" in the last 72 hours. Anemia Panel: No results for input(s): "VITAMINB12", "FOLATE", "FERRITIN", "TIBC", "IRON", "RETICCTPCT" in the last 72 hours. Sepsis Labs: No results for input(s): "PROCALCITON", "LATICACIDVEN" in the last 168 hours.  Recent Results (from the past 240 hours)  Resp panel by RT-PCR (RSV, Flu  A&B, Covid) Anterior Nasal Swab     Status: None   Collection Time: 09/12/23  3:18 PM   Specimen: Anterior Nasal Swab  Result Value Ref Range Status   SARS Coronavirus 2 by RT PCR NEGATIVE NEGATIVE Final   Influenza A by PCR NEGATIVE NEGATIVE Final   Influenza B by PCR NEGATIVE NEGATIVE Final    Comment: (NOTE) The Xpert Xpress SARS-CoV-2/FLU/RSV plus assay is intended as an aid in the diagnosis of influenza from Nasopharyngeal swab specimens and should not be used as a sole basis for treatment. Nasal washings and aspirates are unacceptable for Xpert Xpress SARS-CoV-2/FLU/RSV testing.  Fact Sheet for Patients: BloggerCourse.com  Fact Sheet for Healthcare Providers: SeriousBroker.it  This test is not yet approved or cleared by the [place] FDA and has been authorized for detection and/or diagnosis of SARS-CoV-2 by FDA under an Emergency Use Authorization (EUA). This EUA will remain in effect (meaning this test can be used) for the duration of the COVID-19 declaration under Section 564(b)(1) of the Act, 21 U.S.C. section 360bbb-3(b)(1), unless the authorization is terminated or revoked.  Resp Syncytial Virus by PCR NEGATIVE NEGATIVE Final    Comment: (NOTE) Fact Sheet for Patients: BloggerCourse.com  Fact Sheet for Healthcare Providers: SeriousBroker.it  This test is not yet approved or cleared by the [place] FDA and has been authorized for detection and/or diagnosis of SARS-CoV-2 by FDA under an Emergency Use Authorization (EUA). This EUA will remain in effect (meaning this test can be used) for the duration of the COVID-19 declaration under Section 564(b)(1) of the Act, 21 U.S.C. section 360bbb-3(b)(1), unless the authorization is terminated or revoked.  Performed at [facility], [address], [place], [place] [address]          Radiology  Studies: ECHOCARDIOGRAM COMPLETE Result Date: 09/13/2023    ECHOCARDIOGRAM REPORT   Patient Name:   Mercedes [name] Date of Exam: 09/13/2023 Medical Rec #:  [id number]         Height:       65.0 in Accession #:    [id number]        Weight:       90.4 lb Date of Birth:  09/15/1955         BSA:          1.411 m Patient Age:    67 years          BP:           171/99 mmHg Patient Gender: F                 HR:           60 bpm. Exam Location:  Inpatient Procedure: 2D Echo, Cardiac Doppler and Color Doppler Indications:    Stroke  History:        Patient has prior history of Echocardiogram examinations, most                 recent 08/29/2022. Risk Factors:Hypertension, Diabetes,                 Dyslipidemia and Former Smoker.  Sonographer:    Karma Ganja Referring Phys: [id number] Cecille Po MELVIN  Sonographer Comments: Technically challenging study due to limited acoustic windows. IMPRESSIONS  1. Left ventricular ejection fraction, by estimation, is 55 to 60%. The left ventricle has normal function. The left ventricle demonstrates regional wall motion abnormalities (see scoring diagram/findings for description). There is moderate asymmetric left ventricular hypertrophy of the basal-septal segment. Left ventricular diastolic parameters are consistent with Grade I diastolic dysfunction (impaired relaxation).  2. Right ventricular systolic function is normal. The right ventricular size is normal.  3. The mitral valve is normal in structure. Trivial mitral valve regurgitation. No evidence of mitral stenosis.  4. The aortic valve is normal in structure. There is mild calcification of the aortic valve. Aortic valve regurgitation is not visualized. Aortic valve sclerosis/calcification is present, without any evidence of aortic stenosis.  5. The inferior vena cava is normal in size with greater than 50% respiratory variability, suggesting right atrial pressure of 3 mmHg. Conclusion(s)/Recommendation(s): Overall EF appears normal.  Likely subtle inferoapical hypokinesis. FINDINGS  Left Ventricle: Left ventricular ejection fraction, by estimation, is 55 to 60%. The left ventricle has normal function. The left ventricle demonstrates regional wall motion abnormalities. The left ventricular internal cavity size was normal in size. There is moderate asymmetric left ventricular hypertrophy of the basal-septal segment. Left ventricular diastolic parameters are consistent with Grade I diastolic dysfunction (impaired relaxation).  LV Wall Scoring: The apical inferior segment is hypokinetic. Right Ventricle:  The right ventricular size is normal. No increase in right ventricular wall thickness. Right ventricular systolic function is normal. Left Atrium: Left atrial size was normal in size. Right Atrium: Right atrial size was normal in size. Pericardium: There is no evidence of pericardial effusion. Mitral Valve: The mitral valve is normal in structure. Trivial mitral valve regurgitation. No evidence of mitral valve stenosis. MV peak gradient, 2.5 mmHg. The mean mitral valve gradient is 1.0 mmHg. Tricuspid Valve: The tricuspid valve is normal in structure. Tricuspid valve regurgitation is not demonstrated. No evidence of tricuspid stenosis. Aortic Valve: The aortic valve is normal in structure. There is mild calcification of the aortic valve. Aortic valve regurgitation is not visualized. Aortic valve sclerosis/calcification is present, without any evidence of aortic stenosis. Aortic valve mean gradient measures 2.0 mmHg. Aortic valve peak gradient measures 3.7 mmHg. Aortic valve area, by VTI measures 2.77 cm. Pulmonic Valve: The pulmonic valve was normal in structure. Pulmonic valve regurgitation is not visualized. No evidence of pulmonic stenosis. Aorta: The aortic root is normal in size and structure. Venous: The inferior vena cava is normal in size with greater than 50% respiratory variability, suggesting right atrial pressure of 3 mmHg. IAS/Shunts:  No atrial level shunt detected by color flow Doppler.  LEFT VENTRICLE PLAX 2D LVIDd:         4.30 cm   Diastology LVIDs:         3.30 cm   LV e' medial:    5.22 cm/s LV PW:         1.10 cm   LV E/e' medial:  9.3 LV IVS:        1.70 cm   LV e' lateral:   6.20 cm/s LVOT diam:     2.00 cm   LV E/e' lateral: 7.8 LV SV:         49 LV SV Index:   35 LVOT Area:     3.14 cm  RIGHT VENTRICLE            IVC RV Basal diam:  3.00 cm    IVC diam: 1.20 cm RV S prime:     8.49 cm/s TAPSE (M-mode): 1.2 cm LEFT ATRIUM             Index        RIGHT ATRIUM           Index LA diam:        3.90 cm 2.76 cm/m   RA Area:     12.60 cm LA Vol (A2C):   48.8 ml 34.57 ml/m  RA Volume:   31.70 ml  22.46 ml/m LA Vol (A4C):   18.1 ml 12.82 ml/m LA Biplane Vol: 29.5 ml 20.90 ml/m  AORTIC VALVE AV Area (Vmax):    2.84 cm AV Area (Vmean):   2.52 cm AV Area (VTI):     2.77 cm AV Vmax:           96.10 cm/s AV Vmean:          58.500 cm/s AV VTI:            0.177 m AV Peak Grad:      3.7 mmHg AV Mean Grad:      2.0 mmHg LVOT Vmax:         86.80 cm/s LVOT Vmean:        47.000 cm/s LVOT VTI:          0.156 m LVOT/AV VTI ratio: 0.88  AORTA Ao  Root diam: 3.40 cm MITRAL VALVE MV Area (PHT): 3.60 cm    SHUNTS MV Area VTI:   2.31 cm    Systemic VTI:  0.16 m MV Peak grad:  2.5 mmHg    Systemic Diam: 2.00 cm MV Mean grad:  1.0 mmHg MV Vmax:       0.79 m/s MV Vmean:      49.0 cm/s MV Decel Time: 211 msec MV E velocity: 48.40 cm/s MV A velocity: 60.00 cm/s MV E/A ratio:  0.81 Arvilla Meres MD Electronically signed by Arvilla Meres MD Signature Date/Time: 09/13/2023/11:06:01 AM    Final    DG Swallowing Func-Speech Pathology Result Date: 09/13/2023 Table formatting from the original result was not included. Modified Barium Swallow Study Patient Details Name: VERNETA CROSTHWAITE MRN: [id number] Date of Birth: 1955-09-02 Today's Date: 09/13/2023 HPI/PMH: HPI: HAWRA METZER is a 68 yo female presenting to ED 1/15 with slurred speech, L facial droop,  and L sided weakness/numbness. CTA shows R MCA and ACA occlusions and infarct core in R posterior frontal periventricular white matter with penumbra throughout the posterior R MCA. Was recommended to undergo emergent thrombectomy but pt and her family refused. Previously seen by SLP January 2024 with oropharyngeal dysphagia and cognitive deficits. MBS 09/13/22 showed continued aspiration of thin liquids and recommended a Dys 2 diet with nectar thick liquid. PMH includes anemia, anxiety, CAD, GERD, HLD, HTN, MI, PAD, paroxysmal A-fib, RA, Sjogren's syndrome, previous CVA (R ICA 08/2022) with significant residual L sided weakness, tachy-brady syndrome, bilateral BKA Clinical Impression: Clinical Impression: Imaging was not recorded so this study is documented based on SLPs initial interpretation in real time. Pt demonstrates a primary oral dysphagia with significant left CN VII and CN XII weakness. Pt unable to achieve volitional lingual movement, but uses secondary movement, rocking, posterior tilt and face wiping to elicit sensation and subsequent movement. Her impairments are again severe, but seem to be similar in nature to prior admissions and strokes, so she is quickly using strategies that have worked in the past. Pt has anterior loss and oral residue with liquids and thin and nectar thick liquids are aspirated before/during the swallow due to slightly late airway closure. Controlling bolus size with teaspoon is again most effective. There is no pharyngeal residue. When given honey and puree teaspoon pt has a more cohesive oral phase and there is no penetraiton or aspiration. Pt seems to improve as study progressess. Will benefit from initaiting a conservative diet for practice puree/honey but has good potential to upgrade as she has done in the past. She is unhappy about purees and wants more texture in the future, but during the study, increased texture significantly delayed oral transit and increased oral  residue. Factors that may increase risk of adverse event in presence of aspiration Rubye Oaks & Clearance Coots 2021): No data recorded Recommendations/Plan: Swallowing Evaluation Recommendations Swallowing Evaluation Recommendations Recommendations: PO diet PO Diet Recommendation: Dysphagia 1 (Pureed); Moderately thick liquids (Level 3, honey thick) Liquid Administration via: Spoon Medication Administration: Crushed with puree Supervision: Staff to assist with self-feeding Swallowing strategies  : Slow rate; Small bites/sips; Check for pocketing or oral holding; Check for anterior loss Postural changes: Position pt fully upright for meals; Stay upright 30-60 min after meals Oral care recommendations: Oral care before PO Caregiver Recommendations: Have oral suction available Treatment Plan Treatment Plan Treatment recommendations: Therapy as outlined in treatment plan below Follow-up recommendations: Home health SLP Functional status assessment: Patient has had a recent decline in their  functional status and demonstrates the ability to make significant improvements in function in a reasonable and predictable amount of time. Treatment frequency: Min 2x/week Treatment duration: 2 weeks Interventions: Patient/family education; Aspiration precaution training; Trials of upgraded texture/liquids; Diet toleration management by SLP Recommendations Recommendations for follow up therapy are one component of a multi-disciplinary discharge planning process, led by the attending physician.  Recommendations may be updated based on patient status, additional functional criteria and insurance authorization. Assessment: Orofacial Exam: Orofacial Exam Oral Cavity: Oral Hygiene: Pooled secretions Oral Cavity - Dentition: Adequate natural dentition Orofacial Anatomy: WFL Oral Motor/Sensory Function: Suspected cranial nerve impairment CN V - Trigeminal: WFL CN VII - Facial: Left motor impairment CN XII - Hypoglossal: Left motor impairment; Right  motor impairment Anatomy: Anatomy: WFL Boluses Administered: Boluses Administered Boluses Administered: Thin liquids (Level 0); Mildly thick liquids (Level 2, nectar thick); Moderately thick liquids (Level 3, honey thick); Puree  Oral Impairment Domain: Oral Impairment Domain Lip Closure: Escape beyond mid-chin Tongue control during bolus hold: Cohesive bolus between tongue to palatal seal Bolus preparation/mastication: Minimal chewing/mashing with majority of bolus unchewed Bolus transport/lingual motion: Minimal-no tongue motion Oral residue: Residue collection on oral structures Location of oral residue : Tongue; Palate Initiation of pharyngeal swallow : Pyriform sinuses  Pharyngeal Impairment Domain: Pharyngeal Impairment Domain Soft palate elevation: No bolus between soft palate (SP)/pharyngeal wall (PW) Laryngeal elevation: Complete superior movement of thyroid cartilage with complete approximation of arytenoids to epiglottic petiole Anterior hyoid excursion: Complete anterior movement Epiglottic movement: Complete inversion Laryngeal vestibule closure: Complete, no air/contrast in laryngeal vestibule Pharyngeal stripping wave : Present - complete Pharyngoesophageal segment opening: Complete distension and complete duration, no obstruction of flow Tongue base retraction: No contrast between tongue base and posterior pharyngeal wall (PPW) Pharyngeal residue: Complete pharyngeal clearance  Esophageal Impairment Domain: No data recorded Pill: No data recorded Penetration/Aspiration Scale Score: Penetration/Aspiration Scale Score 1.  Material does not enter airway: Thin liquids (Level 0); Mildly thick liquids (Level 2, nectar thick); Moderately thick liquids (Level 3, honey thick); Puree 4.  Material enters airway, CONTACTS cords then ejected out: Mildly thick liquids (Level 2, nectar thick); Thin liquids (Level 0) 8.  Material enters airway, passes BELOW cords without attempt by patient to eject out (silent  aspiration) : Thin liquids (Level 0); Mildly thick liquids (Level 2, nectar thick) Compensatory Strategies: Compensatory Strategies Compensatory strategies: No   General Information: Caregiver present: No  Diet Prior to this Study: NPO   Temperature : Normal   Respiratory Status: WFL   Supplemental O2: None (Room air)   History of Recent Intubation: No  Behavior/Cognition: Alert; Cooperative Self-Feeding Abilities: Able to self-feed Baseline vocal quality/speech: Other (comment) (wet) Volitional Cough: Able to elicit Volitional Swallow: Able to elicit Exam Limitations: Excessive movement Goal Planning: Prognosis for improved oropharyngeal function: Good No data recorded No data recorded Patient/Family Stated Goal: coffee Consulted and agree with results and recommendations: Patient Pain: Pain Assessment Pain Assessment: No/denies pain End of Session: Start Time:SLP Start Time (ACUTE ONLY): 0935 Stop Time: SLP Stop Time (ACUTE ONLY): 0956 Time Calculation:SLP Time Calculation (min) (ACUTE ONLY): 21 min Charges: SLP Evaluations $ SLP Speech Visit: 1 Visit SLP Evaluations $BSS Swallow: 1 Procedure $MBS Swallow: 1 Procedure $Swallowing Treatment: 1 Procedure SLP visit diagnosis: SLP Visit Diagnosis: Dysphagia, oral phase (R13.11) Past Medical History: Past Medical History: Diagnosis Date  Abnormal LFTs   a. in the past when on Lipitor  Acute ischemic right ICA stroke (HCC) 08/28/2022  Anemia  as a young woman  Anxiety   Arnold-Chiari malformation, type I (HCC)   MRI brain 02/2010  Atrial flutter with rapid ventricular response vs. SVT 10/27/2014  CAD (coronary artery disease)   a. NSTEMI 07/2009: LHC - D2 40%, LAD irreg., EF 50% with apical AK (tako-tsubo CM);  b. Inf STEMI (04/2013): Tx Promus DES to CFX;  c. 08/2013 Lexi CL: No ischemia, dist ant/ap/inf-ap infarct, EF  d. 06/2016 NSTEMI with LM disease: s/p CABG on 07/17/2016 w/ LIMA-LAD, SVG-OM, and SVG-dRCA.  DEPRESSION   Dizziness   ? CVA 01/2010 - carotid dopplers  with no ICA stenosis  GERD (gastroesophageal reflux disease)   Headache   History of transmetatarsal amputation of left foot (HCC) 07/17/2019  HYPERLIPIDEMIA   HYPERTENSION   Ischemia of extremity 10/29/2020  MI (myocardial infarction) (HCC)   PAD (peripheral artery disease) (HCC)   s/p L fem pop 11/2013 in setting of 1st toe osteo/gangrene  Paroxysmal atrial fibrillation (HCC)   a. anticoagulated with Eliquis 10/2014  Presence of permanent cardiac pacemaker 10/2014  leadless permanent pacemaker  RA (rheumatoid arthritis) (HCC)   prior tx by Dr. Dareen Piano  Sjogren's syndrome (HCC) 06/02/2013  pt denies this (12/01/13)  SLE (systemic lupus erythematosus) (HCC) dx 05/2013  follows with rheum Dareen Piano)  Stroke [facility])   March 2023, weakness in right arm is improving  Tachy-brady syndrome (HCC)   a. 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  f/u echo 09/2009: EF 50-55%, mild LVH, mod diast dysfxn, mild apical HK  Wears dentures   Wears glasses  Past Surgical History: Past Surgical History: Procedure Laterality Date  ABDOMINAL AORTAGRAM N/A 11/24/2013  Procedure: ABDOMINAL AORTAGRAM WITH BILATERAL RUNOFF WITH POSSIBLE INTERVENTION;  Surgeon: Chuck Hint, MD;  Location: [facility] CATH LAB;  Service: Cardiovascular;  Laterality: N/A;  ABDOMINAL AORTOGRAM W/LOWER EXTREMITY N/A 04/25/2019  Procedure: ABDOMINAL AORTOGRAM W/LOWER EXTREMITY;  Surgeon: Sherren Kerns, MD;  Location: [facility] INVASIVE CV LAB;  Service: Cardiovascular;  Laterality: N/A;  ABDOMINAL AORTOGRAM W/LOWER EXTREMITY Bilateral 05/15/2019  Procedure: ABDOMINAL AORTOGRAM W/LOWER EXTREMITY;  Surgeon: Nada Libman, MD;  Location: [facility] INVASIVE CV LAB;  Service: Cardiovascular;  Laterality: Bilateral;  ABDOMINAL AORTOGRAM W/LOWER EXTREMITY N/A 08/23/2020  Procedure: ABDOMINAL AORTOGRAM W/LOWER EXTREMITY;  Surgeon: Maeola Harman, MD;  Location: [facility] INVASIVE CV LAB;  Service: Cardiovascular;  Laterality: N/A;  ABDOMINAL  AORTOGRAM W/LOWER EXTREMITY N/A 10/29/2020  Procedure: ABDOMINAL AORTOGRAM W/LOWER EXTREMITY;  Surgeon: Sherren Kerns, MD;  Location: [facility] INVASIVE CV LAB;  Service: Cardiovascular;  Laterality: N/A;  AMPUTATION Left 12/02/2013  Procedure: LEFT 1ST TOE AMPUTATION;  Surgeon: Sherren Kerns, MD;  Location: [facility] OR;  Service: Vascular;  Laterality: Left;  AMPUTATION Left 07/16/2019  Procedure: LEFT TRANSMETATARSAL AMPUTATION;  Surgeon: Nadara Mustard, MD;  Location: [facility] OR;  Service: Orthopedics;  Laterality: Left;  AMPUTATION Left 10/01/2019  Procedure: LEFT AMPUTATION ABOVE KNEE;  Surgeon: Sherren Kerns, MD;  Location: [facility] OR;  Service: Vascular;  Laterality: Left;  AMPUTATION Right 08/31/2020  Procedure: Right first toe amputation;  Surgeon: Sherren Kerns, MD;  Location: [facility] OR;  Service: Vascular;  Laterality: Right;  AMPUTATION Right 11/23/2020  Procedure: RIGHT AMPUTATION ABOVE KNEE;  Surgeon: Sherren Kerns, MD;  Location: [facility] OR;  Service: Vascular;  Laterality: Right;  AMPUTATION Left 03/14/2022  Procedure: REVISION LEFT ABOVE KNEE AMPUTATION;  Surgeon: Maeola Harman, MD;  Location: [facility] OR;  Service: Vascular;  Laterality: Left;  APPLICATION OF WOUND VAC Left  11/03/2019  Procedure: Application Of Wound Vac, left lower extremity;  Surgeon: Sherren Kerns, MD;  Location: [facility] OR;  Service: Vascular;  Laterality: Left;  CARDIAC CATHETERIZATION N/A 07/12/2016  Procedure: Left Heart Cath and Coronary Angiography;  Surgeon: Lennette Bihari, MD;  Location: [facility] INVASIVE CV LAB;  Service: Cardiovascular;  Laterality: N/A;  CARDIAC CATHETERIZATION N/A 07/14/2016  Procedure: Intravascular Ultrasound/IVUS;  Surgeon: Yvonne Kendall, MD;  Location: [facility] INVASIVE CV LAB;  Service: Cardiovascular;  Laterality: N/A;  COLONOSCOPY    CORONARY ANGIOPLASTY  04/2013  CORONARY ARTERY BYPASS GRAFT N/A 07/17/2016  Procedure: CORONARY ARTERY BYPASS GRAFTING (CABG)x3 using right greater saphenous vein harvested endoscopiclly and  left internal mammary artery;  Surgeon: Kerin Perna, MD;  Location: [facility] OR;  Service: Open Heart Surgery;  Laterality: N/A;  ENDARTERECTOMY FEMORAL Left 05/12/2019  Procedure: LEFT Femoral Endarterectomy;  Surgeon: Sherren Kerns, MD;  Location: [facility] OR;  Service: Vascular;  Laterality: Left;  FEMORAL-POPLITEAL BYPASS GRAFT Left 12/02/2013  Procedure:  LEFT FEMORAL-BELOW KNEE POPLITEAL ARTERY BYPASS GRAFT;  Surgeon: Sherren Kerns, MD;  Location: [facility] OR;  Service: Vascular;  Laterality: Left;  FEMORAL-POPLITEAL BYPASS GRAFT Left 09/25/2019  Procedure: THROMBECTOMY and PROPATEN BYPASS  FEMORAL TO BELOW KNEE POPLITEAL ARTERY BYPASS GRAFT LEFT LEG;  Surgeon: Larina Earthly, MD;  Location: [facility] OR;  Service: Vascular;  Laterality: Left;  FEMORAL-POPLITEAL BYPASS GRAFT Right 08/24/2020  Procedure: RIGHT FEMORAL-BELOW KNEE POPLITEAL ARTERY BYPASS USING GORE PROPATEN GRAFT;  Surgeon: Maeola Harman, MD;  Location: [facility] OR;  Service: Vascular;  Laterality: Right;  FEMORAL-POPLITEAL BYPASS GRAFT Right 11/01/2020  Procedure: Thrombectomy and Revision of Right Femoral to below knee Bypass with Interpostional graft to Tibial / peroneal Trunk and Endarterectomy Tibial Weston Settle Trunk.;  Surgeon: Sherren Kerns, MD;  Location: [facility] OR;  Service: Vascular;  Laterality: Right;  INTRAOPERATIVE ARTERIOGRAM Right 11/01/2020  Procedure: ANTEGRADE RIGHT LOWER EXTREMITY INTRA OPERATIVE ARTERIOGRAM;  Surgeon: Sherren Kerns, MD;  Location: [facility] OR;  Service: Vascular;  Laterality: Right;  IR CT HEAD LTD  08/28/2022  IR PERCUTANEOUS ART THROMBECTOMY/INFUSION INTRACRANIAL INC DIAG ANGIO  08/28/2022  IR US GUIDE VASC ACCESS RIGHT  08/28/2022  LEFT HEART CATHETERIZATION WITH CORONARY ANGIOGRAM N/A 05/19/2013  Procedure: LEFT HEART CATHETERIZATION WITH CORONARY ANGIOGRAM;  Surgeon: Micheline Chapman, MD;  Location: [facility] CATH LAB;  Service: Cardiovascular;  Laterality: N/A;  LEFT HEART CATHETERIZATION WITH CORONARY ANGIOGRAM N/A 10/28/2014  Procedure:  LEFT HEART CATHETERIZATION WITH CORONARY ANGIOGRAM;  Surgeon: Iran Ouch, MD;  Location: [facility] CATH LAB;  Service: Cardiovascular;  Laterality: N/A;  LOWER EXTREMITY ANGIOGRAM Left 05/12/2019  Procedure: Lower Extremity Angiogram;  Surgeon: Sherren Kerns, MD;  Location: [facility] OR;  Service: Vascular;  Laterality: Left;  PATCH ANGIOPLASTY Left 05/12/2019  Procedure: Left Femoral Patch Angioplasty USING CADAVERIC SAPHENOUS VEIN;  Surgeon: Sherren Kerns, MD;  Location: [facility] OR;  Service: Vascular;  Laterality: Left;  PERCUTANEOUS CORONARY STENT INTERVENTION (PCI-S)  05/19/2013  Procedure: PERCUTANEOUS CORONARY STENT INTERVENTION (PCI-S);  Surgeon: Micheline Chapman, MD;  Location: [facility] CATH LAB;  Service: Cardiovascular;;  PERIPHERAL VASCULAR INTERVENTION Right 10/29/2020  Procedure: PERIPHERAL VASCULAR INTERVENTION;  Surgeon: Sherren Kerns, MD;  Location: [facility] INVASIVE CV LAB;  Service: Cardiovascular;  Laterality: Right;  common iliac  PERMANENT PACEMAKER INSERTION N/A 11/04/2014  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  RADIOLOGY WITH ANESTHESIA N/A 08/28/2022  Procedure: RADIOLOGY WITH ANESTHESIA;  Surgeon: Radiologist, Medication, MD;  Location: [facility] OR;  Service: Radiology;  Laterality: N/A;  RADIOLOGY WITH ANESTHESIA N/A 09/12/2023  Procedure: RADIOLOGY WITH ANESTHESIA;  Surgeon: Radiologist, Medication, MD;  Location: [facility] OR;  Service: Radiology;  Laterality: N/A;  TEE WITHOUT CARDIOVERSION N/A 07/17/2016  Procedure: TRANSESOPHAGEAL ECHOCARDIOGRAM (TEE);  Surgeon: Kerin Perna, MD;  Location: [facility] OR;  Service: Open Heart Surgery;  Laterality: N/A;  THROMBECTOMY FEMORAL ARTERY Left 05/12/2019  Procedure: Left Ilio-Femoral Artery and Femoral-popliteal Graft Thrombectomy;  Surgeon: Sherren Kerns, MD;  Location: [facility] OR;  Service: Vascular;  Laterality: Left;  THROMBECTOMY OF BYPASS GRAFT FEMORAL- POPLITEAL ARTERY Right 11/01/2020  Procedure: POSSIBLE THROMBOLYSIS VERSUS OPEN THROMBECTOMY AND REVSION  OF RIGHT FEMORAL-POPLITEAL ARTERY BYPASS;  Surgeon: Sherren Kerns, MD;  Location: [facility] OR;  Service: Vascular;  Laterality: Right;  TUBAL LIGATION    VISCERAL ANGIOGRAPHY N/A 04/25/2019  Procedure: MESENTRIC  ANGIOGRAPHY;  Surgeon: Sherren Kerns, MD;  Location: [facility] INVASIVE CV LAB;  Service: Cardiovascular;  Laterality: N/A;  WOUND DEBRIDEMENT Left 11/03/2019  Procedure: Debridement Wound of left above the knee amputation;  Surgeon: Sherren Kerns, MD;  Location: [facility] OR;  Service: Vascular;  Laterality: Left; DeBlois, Riley Nearing 09/13/2023, 10:50 AM  DG Chest Portable 1 View Result Date: 09/12/2023 CLINICAL DATA:  Stroke workup slurred speech EXAM: PORTABLE CHEST 1 VIEW COMPARISON:  01/31/2019 FINDINGS: Post sternotomy changes. No acute airspace disease or effusion. Normal cardiomediastinal silhouette with aortic atherosclerosis. Leadless pacemaker is noted. IMPRESSION: No active disease. Electronically Signed   By: Jasmine Pang M.D.   On: 09/12/2023 15:41   CT ANGIO HEAD NECK W WO CM W PERF (CODE STROKE) Result Date: 09/12/2023 CLINICAL DATA:  Aphasia, stroke suspected EXAM: CT ANGIOGRAPHY HEAD AND NECK CT PERFUSION BRAIN TECHNIQUE: Multidetector CT imaging of the head and neck was performed using the standard protocol during bolus administration of intravenous contrast. Multiplanar CT image reconstructions and MIPs were obtained to evaluate the vascular anatomy. Carotid stenosis measurements (when applicable) are obtained utilizing NASCET criteria, using the distal internal carotid diameter as the denominator. Multiphase CT imaging of the brain was performed following IV bolus contrast injection. Subsequent parametric perfusion maps were calculated using RAPID software. RADIATION DOSE REDUCTION: This exam was performed according to the departmental dose-optimization program which includes automated exposure control, adjustment of the mA and/or kV according to patient size and/or use of iterative  reconstruction technique. CONTRAST:  100 mL Omnipaque 350 COMPARISON:  CTA 08/28/2022, CT head 09/12/2023 FINDINGS: CT HEAD FINDINGS For noncontrast findings, please see same day CT head. CTA NECK FINDINGS Aortic arch: The arch is not included in the field of view. Right carotid system: 50% stenosis in the proximal right ICA, secondary to noncalcified plaque (series 8, image 269). Moderate stenosis at the origin of the right ECA. No evidence of dissection. Previously noted distal ICA occlusion is no longer seen. Left carotid system: 70% stenosis in the proximal left ICA (series 8, image 421). No significant stenosis at the origin of the ECA. No evidence of dissection. Vertebral arteries: No evidence of dissection, occlusion, or hemodynamically significant stenosis (greater than 50%). Skeleton: No acute osseous abnormality. Degenerative changes in the cervical spine. Other neck: 4 mm hypoenhancing nodule in the left thyroid, for which no follow-up is currently indicated. (Reference: J Am Coll Radiol. 2015 Feb;12(2): 143-50). Fluid in the esophagus, which is patulous, increasing the risk for aspiration. Upper chest: No focal pulmonary opacity or pleural effusion. Emphysema. Review of the MIP images confirms the above findings CTA HEAD FINDINGS Anterior circulation: Both internal carotid arteries  are patent to the termini, with calcifications but without significant stenosis. A1 segments patent. Normal anterior communicating artery. Occlusion of the right A2 just after its origin (series 8, image 146 and 140), with distal reconstitution (series 8, image 112). The left A2 and distal left ACA branches are patent. Mild stenosis in the proximal right M1. Occlusion of the proximal right M2 insular branch (series 8, image 138), with reconstitution in its M3 branches (series 8, image 133). The more lateral right M3 is subsequently reoccluded distally (series 8, image 124), with additional multifocal occlusion of its  downstream branches (series 8, image 123 and 121). While other downstream branches of the more medial right M3 are occluded more superiorly (series 8, image 106). The anterior right M2 and is branches are patent. The left M1 and left MCA branches are patent, without significant stenosis. Posterior circulation: Vertebral arteries patent to the vertebrobasilar junction without significant stenosis. Posterior inferior cerebellar arteries patent proximally. Basilar patent to its distal aspect without significant stenosis. Superior cerebellar arteries patent proximally. Patent right P1. Fetal origin of the left PCA from the left posterior communicating artery. The right posterior communicating artery is also patent. PCAs perfused to their distal aspects without significant stenosis. Venous sinuses: As permitted by contrast timing, patent. Anatomic variants: Fetal left PCA. No evidence of aneurysm or vascular malformation. Review of the MIP images confirms the above findings CT Brain Perfusion Findings: ASPECTS: 10 CBF (<30%) Volume: 5mL Perfusion (Tmax>6.0s) volume: Mismatch Volume: Infarction Location:Infarct core in the right posterior frontal periventricular white matter, penumbra throughout the posterior right MCA territory. Code stroke imaging results were communicated on 09/12/2023 at 2:27 pm to provider Dr. Wilford Corner via telephone, who verbally acknowledged these results. IMPRESSION: 1. Occlusion of the proximal right M2 insular branch, with reconstitution in its M3 branches and multifocal more downstream occlusion and reconstitution. 2. Occlusion of the right A2 just after its origin, with distal reconstitution. 3. Infarct core in the right posterior frontal periventricular white matter, with penumbra throughout the posterior right MCA territory, measuring 5 mL. 4. 50% stenosis in the proximal right ICA, secondary to noncalcified plaque. 5. 70% stenosis in the proximal left ICA. 6. Fluid in the esophagus,  which is patulous, increasing the risk for aspiration. Right MCA occlusion was discussed on 09/12/2023 at 2:27 pm with provider Dr. Wilford Corner via telephone, who verbally acknowledged these results. Right ACA occlusion was communicated on 09/12/2023 at 2:48 pm to provider Dr. Wilford Corner via secure text paging. Electronically Signed   By: Wiliam Ke M.D.   On: 09/12/2023 14:49   CT HEAD CODE STROKE WO CONTRAST` Result Date: 09/12/2023 CLINICAL DATA:  Code stroke.  Aphasia EXAM: CT HEAD WITHOUT CONTRAST TECHNIQUE: Contiguous axial images were obtained from the base of the skull through the vertex without intravenous contrast. RADIATION DOSE REDUCTION: This exam was performed according to the departmental dose-optimization program which includes automated exposure control, adjustment of the mA and/or kV according to patient size and/or use of iterative reconstruction technique. COMPARISON:  09/07/2022 CT head FINDINGS: Brain: No evidence of acute infarction, hemorrhage, mass, mass effect, or midline shift. No hydrocephalus or extra-axial collection. Encephalomalacia in the right lentiform nucleus and external capsule, consistent with sequela of the acute infarct noted in January 2024, with widening of the right sylvian fissure. Basal ganglia calcifications. Vascular: No hyperdense vessel. Skull: Negative for fracture or focal lesion. Sinuses/Orbits: Opacification of the right sphenoid sinus, with osseous thickening of the walls. Additional milder mucosal thickening in the  maxillary sinuses. No acute finding in the orbits. Other: The mastoid air cells are well aerated. ASPECTS [facility] Stroke Program Early CT Score) - Ganglionic level infarction (caudate, lentiform nuclei, internal capsule, insula, M1-M3 cortex): 7 - Supraganglionic infarction (M4-M6 cortex): 3 Total score (0-10 with 10 being normal): 10 IMPRESSION: 1. No acute intracranial process. ASPECTS is 10. 2. Encephalomalacia in the right lentiform nucleus and  external capsule, consistent with sequela of the acute infarct noted in January 2024. 3. Chronic right sphenoid sinusitis. Imaging results were communicated on 09/12/2023 at 12:42 pm to provider Dr. Wilford Corner via secure text paging. Electronically Signed   By: Wiliam Ke M.D.   On: 09/12/2023 12:42        Scheduled Meds:   stroke: early stages of recovery book   Does not apply Once   apixaban  5 mg Oral BID   mouth rinse  15 mL Mouth Rinse 4 times per day   rosuvastatin  40 mg Oral Daily   Continuous Infusions:  sodium chloride 40 mL/hr at 09/13/23 0311     LOS: 1 day    Time spent: 40 minutes    Dorcas Carrow, MD [facility]

## 2023-09-13 NOTE — Evaluation (Signed)
Physical Therapy Evaluation Patient Details Name: Mercedes [name] MRN: [id number] DOB: 1956/06/01 Today's Date: 09/13/2023  History of Present Illness  Mercedes [name] is a 68 y.o. female who presented with slurred speech, difficulty talking and left-sided facial droop along with left-sided abnormal sensation and left arm weakness. CT revealed infarct core in the right posterior frontal periventricular white matter. PMHx Abnormal LFTs, Anemia, Anxiety, Arnold-Chiari malformation, CAD, DEPRESSION, Dizziness, GERD, Headache, transmetatarsal amputation of left foot, HYPERLIPIDEMIA, HYPERTENSION, MI, PAD, Paroxysmal atrial fibrillation, cardiac pacemaker, RA, Sjogren's syndrome, SLE, Stroke, Tachy-brady syndrome, Takotsubo cardiomyopathy, bilateral AKA  Clinical Impression   Pt admitted secondary to problem above with deficits below. PTA patient reports she was independent with lateral transfers into her power chair and independently managed chair for locomotion. Pt currently requires +2 total assist to sit at EOB with severe left neglect and right lean. Due to pt's limited home assistance (care attendant 2 hr/day), recommend continued inpatient follow up therapy, <3 hours/day. Anticipate patient will benefit from PT to address problems listed below.Will continue to follow acutely to maximize functional mobility independence and safety.           If plan is discharge home, recommend the following: Two people to help with walking and/or transfers;Direct supervision/assist for medications management;Direct supervision/assist for financial management;Assist for transportation;Help with stairs or ramp for entrance;Supervision due to cognitive status   Can travel by private vehicle   No    Equipment Recommendations Hoyer lift;Hospital bed  Recommendations for Other Services       Functional Status Assessment Patient has had a recent decline in their functional status and demonstrates the ability  to make significant improvements in function in a reasonable and predictable amount of time.     Precautions / Restrictions Precautions Precautions: Fall Restrictions Weight Bearing Restrictions Per Provider Order: No      Mobility  Bed Mobility Overal bed mobility: Needs Assistance Bed Mobility: Rolling, Sidelying to Sit, Supine to Sit, Sit to Supine, Sit to Sidelying (Pt required TOTAL A +2 for safety) Rolling: Max assist Sidelying to sit: Total assist, +2 for physical assistance     Sit to sidelying: Total assist, +2 for physical assistance General bed mobility comments: rolled to her right with pt using rail with RUE; initially using RUE to assist with side to sit (later pulling herself down onto her right side)    Transfers                   General transfer comment: unsafe to attempt due to impaired sitting balance    Ambulation/Gait                  Stairs            Wheelchair Mobility     Tilt Bed    Modified Rankin (Stroke Patients Only) Modified Rankin (Stroke Patients Only) Pre-Morbid Rankin Score: Moderately severe disability Modified Rankin: Severe disability     Balance Overall balance assessment: Needs assistance Sitting-balance support: Single extremity supported Sitting balance-Leahy Scale: Zero Sitting balance - Comments: no sense of midline with pulling on rail toward her right; when able to engage RUE in ADL, she could maintain midline with mod assist Postural control: Right lateral lean                                   Pertinent Vitals/Pain Pain Assessment Pain Assessment: Faces Faces Pain Scale:  Hurts little more Pain Location: R groin area Pain Descriptors / Indicators: Grimacing, Moaning Pain Intervention(s): Limited activity within patient's tolerance, Monitored during session, Other (comment) (RN made aware)    Home Living Family/patient expects to be discharged to:: Private residence Living  Arrangements: Alone Available Help at Discharge: Personal care attendant;Family (caregiver present 2hrs each day and family checking in) Type of Home: House Home Access: Ramped entrance       Home Layout: One level Home Equipment: Tub bench;Wheelchair - power;Hospital bed;Wheelchair - manual Additional Comments: Pt does not use BSC due to reports of fall    Prior Function Prior Level of Function : Needs assist       Physical Assist : ADLs (physical)   ADLs (physical): Bathing;Dressing;Toileting;IADLs Mobility Comments: Pt reported using power wheelchair, laterally transfers indep (Pt reported using power wheelchair) ADLs Comments: caregiver assisted with sponge bathing     Extremity/Trunk Assessment   Upper Extremity Assessment Upper Extremity Assessment: Defer to OT evaluation LUE Deficits / Details: flaccid, no activation noted. No response to noxious stim LUE Sensation: decreased light touch;decreased proprioception LUE Coordination: decreased gross motor;decreased fine motor    Lower Extremity Assessment Lower Extremity Assessment:  (bil AKA with some active movement bil)       Communication   Communication Communication: Difficulty communicating thoughts/reduced clarity of speech  Cognition Arousal: Alert Behavior During Therapy: Impulsive Overall Cognitive Status: Difficult to assess Area of Impairment: Awareness, Safety/judgement                         Safety/Judgement: Decreased awareness of deficits, Decreased awareness of safety     General Comments: pt mostly unintelligible; yes/no appear accurate; using RUE to pull herself to the right beyond midline in unsafe manner        General Comments General comments (skin integrity, edema, etc.): Wound present rt groin and RN aware    Exercises     Assessment/Plan    PT Assessment Patient needs continued PT services  PT Problem List Decreased strength;Decreased activity tolerance;Decreased  balance;Decreased mobility;Decreased cognition;Decreased knowledge of use of DME;Decreased safety awareness;Impaired sensation;Impaired tone;Decreased skin integrity;Pain       PT Treatment Interventions DME instruction;Functional mobility training;Therapeutic activities;Therapeutic exercise;Balance training;Neuromuscular re-education;Cognitive remediation;Patient/family education;Wheelchair mobility training    PT Goals (Current goals can be found in the Care Plan section)  Acute Rehab PT Goals Patient Stated Goal: unable to state PT Goal Formulation: Patient unable to participate in goal setting Time For Goal Achievement: 09/27/23 Potential to Achieve Goals: Fair    Frequency Min 3X/week     Co-evaluation PT/OT/SLP Co-Evaluation/Treatment: Yes Reason for Co-Treatment: Complexity of the patient's impairments (multi-system involvement);For patient/therapist safety;To address functional/ADL transfers PT goals addressed during session: Mobility/safety with mobility;Balance OT goals addressed during session: ADL's and self-care       AM-PAC PT "6 Clicks" Mobility  Outcome Measure Help needed turning from your back to your side while in a flat bed without using bedrails?: A Lot Help needed moving from lying on your back to sitting on the side of a flat bed without using bedrails?: Total Help needed moving to and from a bed to a chair (including a wheelchair)?: Total Help needed standing up from a chair using your arms (e.g., wheelchair or bedside chair)?: Total Help needed to walk in hospital room?: Total Help needed climbing 3-5 steps with a railing? : Total 6 Click Score: 7    End of Session   Activity Tolerance: Patient  limited by fatigue Patient left: in bed;with call bell/phone within reach;with bed alarm set;with nursing/sitter in room Nurse Communication: Mobility status;Other (comment) (diaper removed, not sure purewick will work due to pt moving about in bed) PT Visit  Diagnosis: Hemiplegia and hemiparesis;Other symptoms and signs involving the nervous system (R29.898) Hemiplegia - Right/Left: Left Hemiplegia - caused by: Cerebral infarction    Time: 3875-6433 PT Time Calculation (min) (ACUTE ONLY): 22 min   Charges:   PT Evaluation $PT Eval Moderate Complexity: 1 Mod   PT General Charges $$ ACUTE PT VISIT: 1 Visit          Jerolyn Center, PT Acute Rehabilitation Services  Office [phone]   Zena Amos 09/13/2023, 1:46 PM

## 2023-09-13 NOTE — Plan of Care (Signed)
Alert and oriented. Went down for [employer] and diet advanced. Family at bedside and brought incontinent briefs per patient request.  Dysarthria remains, complex communication, we are utilizing written board and gestures for communication.   Problem: Education: Goal: Knowledge of disease or condition will improve Outcome: Progressing Goal: Knowledge of secondary prevention will improve (MUST DOCUMENT ALL) Outcome: Progressing Goal: Knowledge of patient specific risk factors will improve (DELETE if not current risk factor) Outcome: Progressing   Problem: Ischemic Stroke/TIA Tissue Perfusion: Goal: Complications of ischemic stroke/TIA will be minimized Outcome: Progressing   Problem: Coping: Goal: Will verbalize positive feelings about self Outcome: Progressing Goal: Will identify appropriate support needs Outcome: Progressing   Problem: Health Behavior/Discharge Planning: Goal: Ability to manage health-related needs will improve Outcome: Progressing Goal: Goals will be collaboratively established with patient/family Outcome: Progressing   Problem: Self-Care: Goal: Ability to participate in self-care as condition permits will improve Outcome: Progressing Goal: Verbalization of feelings and concerns over difficulty with self-care will improve Outcome: Progressing Goal: Ability to communicate needs accurately will improve Outcome: Progressing   Problem: Nutrition: Goal: Risk of aspiration will decrease Outcome: Progressing Goal: Dietary intake will improve Outcome: Progressing   Problem: Education: Goal: Knowledge of General Education information will improve Description: Including pain rating scale, medication(s)/side effects and non-pharmacologic comfort measures Outcome: Progressing   Problem: Health Behavior/Discharge Planning: Goal: Ability to manage health-related needs will improve Outcome: Progressing   Problem: Clinical Measurements: Goal: Ability to maintain  clinical measurements within normal limits will improve Outcome: Progressing Goal: Will remain free from infection Outcome: Progressing Goal: Diagnostic test results will improve Outcome: Progressing Goal: Respiratory complications will improve Outcome: Progressing Goal: Cardiovascular complication will be avoided Outcome: Progressing   Problem: Activity: Goal: Risk for activity intolerance will decrease Outcome: Progressing   Problem: Nutrition: Goal: Adequate nutrition will be maintained Outcome: Progressing   Problem: Coping: Goal: Level of anxiety will decrease Outcome: Progressing   Problem: Elimination: Goal: Will not experience complications related to bowel motility Outcome: Progressing Goal: Will not experience complications related to urinary retention Outcome: Progressing   Problem: Pain Managment: Goal: General experience of comfort will improve and/or be controlled Outcome: Progressing   Problem: Safety: Goal: Ability to remain free from injury will improve Outcome: Progressing   Problem: Skin Integrity: Goal: Risk for impaired skin integrity will decrease Outcome: Progressing

## 2023-09-13 NOTE — Evaluation (Signed)
Occupational Therapy Evaluation Patient Details Name: Mercedes [name] MRN: [id number] DOB: 1956/07/05 Today's Date: 09/13/2023   History of Present Illness Mercedes [name] is a 68 y.o. female who presented with slurred speech, difficulty talking and left-sided facial droop along with left-sided abnormal sensation and left arm weakness. CT revealed infarct core in the right posterior frontal periventricular white matter. PMHx Abnormal LFTs, Anemia, Anxiety, Arnold-Chiari malformation, CAD, DEPRESSION, Dizziness, GERD, Headache, transmetatarsal amputation of left foot, HYPERLIPIDEMIA, HYPERTENSION, MI, PAD, Paroxysmal atrial fibrillation, cardiac pacemaker, RA, Sjogren's syndrome, SLE, Stroke, Tachy-brady syndrome, Takotsubo cardiomyopathy, bilateral AKA   Clinical Impression   [name] was evaluated s/p the above admission list. She lives alone with PCA 2 hours/day 7 days/wk, she mobilizes with a PWC and laterally transfers independently, she uses a brief for toileting and has assist for most other ADLs at baseline. Upon evaluation the pt was limited by L hemibody sensory motor deficits, LUE flaccidity, no response to noxious stim on LUE, significant R gaze with head turn, impaired communication and cognition, baseline bilateral AKAs and no awareness of midline with significant R lateral lean. Overall she needed max A +2 to for bed mobility and to sit EOB towards her R; once sitting she needed total-mod A for sitting balance. Due to the deficits listed below the pt also needs up to total A for LB ADLs and max A for UB ADLs. Pt will benefit from continued acute OT services and skilled inpatient follow up therapy, <3 hours/day.        If plan is discharge home, recommend the following: Two people to help with walking and/or transfers;Two people to help with bathing/dressing/bathroom;Assistance with cooking/housework;Assistance with feeding;Assist for transportation;Supervision due to cognitive  status    Functional Status Assessment  Patient has had a recent decline in their functional status and demonstrates the ability to make significant improvements in function in a reasonable and predictable amount of time.  Equipment Recommendations  None recommended by OT (defer)    Recommendations for Other Services Other (comment)     Precautions / Restrictions Precautions Precautions: Fall Restrictions Weight Bearing Restrictions Per Provider Order: No      Mobility Bed Mobility Overal bed mobility: Needs Assistance Bed Mobility: Rolling, Sidelying to Sit, Sit to Supine Rolling: Max assist, +2 for physical assistance, +2 for safety/equipment Sidelying to sit: Max assist, +2 for physical assistance, +2 for safety/equipment   Sit to supine: Max assist, +2 for physical assistance, +2 for safety/equipment   General bed mobility comments: pt able to assist when going to stronger R side    Transfers Overall transfer level: Needs assistance                 General transfer comment: bed level eval, laterally scoots at baseline      Balance Overall balance assessment: Needs assistance Sitting-balance support: Single extremity supported Sitting balance-Leahy Scale: Zero Sitting balance - Comments: no sense of midline with pulling on rail toward her right; when able to engage RUE in ADL, she could maintain midline with mod assist                                   ADL either performed or assessed with clinical judgement   ADL Overall ADL's : Needs assistance/impaired Eating/Feeding: Cueing for sequencing;Bed level;Minimal assistance Eating/Feeding Details (indicate cue type and reason): Pt has L side visual neglect and requires cues for initiation Grooming: Wash/dry hands;Wash/dry  face;Oral care;Applying deodorant;Brushing hair;Moderate assistance;Cueing for sequencing;Cueing for compensatory techniques;Bed level   Upper Body Bathing: Cueing for  sequencing;Cueing for compensatory techniques;Bed level;Maximal assistance   Lower Body Bathing: Cueing for sequencing;Maximal assistance;Cueing for compensatory techniques   Upper Body Dressing : Maximal assistance;Bed level;Cueing for sequencing;Cueing for compensatory techniques   Lower Body Dressing: Total assistance;Cueing for sequencing;Cueing for compensatory techniques;Bed level   Toilet Transfer: Total assistance;+2 for physical assistance;BSC/3in1;Requires drop arm   Toileting- Clothing Manipulation and Hygiene: Total assistance;+2 for physical assistance;Bed level   Tub/ Shower Transfer: Total assistance;+2 for physical assistance;Tub bench   Functional mobility during ADLs: Maximal assistance;+2 for physical assistance (bed mobility) General ADL Comments: limited by L hemibody sensory motor deficits, L neglect, R gaze, impiared awarenss of mideline with poor sitting balance.     Vision Baseline Vision/History: 0 No visual deficits Vision Assessment?: Yes Ocular Range of Motion: Restricted on the left;Impaired-to be further tested in functional context Alignment/Gaze Preference: Head turned;Gaze right Tracking/Visual Pursuits: Impaired - to be further tested in functional context Visual Fields: Left visual field deficit Additional Comments: significant hemianopsia, unable to formall assess. Pt unable to bring bilat eyes to midline desite maximal stimulation. R gaze with head turn     Perception Perception: Impaired Preception Impairment Details: Inattention/Neglect     Praxis Praxis: Impaired       Pertinent Vitals/Pain Pain Assessment Pain Assessment: Faces Faces Pain Scale: Hurts little more Pain Location: R groin area Pain Descriptors / Indicators: Grimacing, Moaning Pain Intervention(s): Monitored during session     Extremity/Trunk Assessment Upper Extremity Assessment Upper Extremity Assessment: Defer to OT evaluation LUE Deficits / Details: flaccid, no  activation noted. No response to noxious stim LUE Sensation: decreased light touch;decreased proprioception LUE Coordination: decreased gross motor;decreased fine motor   Lower Extremity Assessment Lower Extremity Assessment:  (bil AKA with some active movement bil)   Cervical / Trunk Assessment Cervical / Trunk Assessment: Kyphotic   Communication Communication Communication: Difficulty communicating thoughts/reduced clarity of speech   Cognition Arousal: Alert Behavior During Therapy: Impulsive Overall Cognitive Status: Difficult to assess Area of Impairment: Attention, Safety/judgement, Awareness, Problem solving, Following commands                   Current Attention Level: Sustained   Following Commands: Follows one step commands inconsistently Safety/Judgement: Decreased awareness of deficits Awareness: Intellectual Problem Solving: Requires verbal cues, Decreased initiation, Difficulty sequencing General Comments: L neglect/field cut, required simple one step commands with repetition due to impaired attention. difficult to fully assess due to expressive communication deficits     General Comments  Wound present rt groin and RN aware    Exercises     Shoulder Instructions      Home Living Family/patient expects to be discharged to:: Private residence Living Arrangements: Alone Available Help at Discharge: Personal care attendant;Family (caregiver present 2hrs each day and family checking in) Type of Home: House Home Access: Ramped entrance     Home Layout: One level     Bathroom Shower/Tub: Tub/shower unit;Sponge bathes at baseline   [employer]:  (briefs) Bathroom Accessibility: Yes How Accessible: Accessible via wheelchair Home Equipment: Tub bench;Wheelchair - power;Hospital bed;Wheelchair - manual   Additional Comments: Pt does not use BSC due to reports of fall      Prior Functioning/Environment Prior Level of Function : Needs assist        Physical Assist : ADLs (physical)   ADLs (physical): Bathing;Dressing;Toileting;IADLs Mobility Comments: Pt reported using power wheelchair, laterally  transfers indep (Pt reported using power wheelchair) ADLs Comments: caregiver assisted with sponge bathing        OT Problem List: Decreased strength;Decreased range of motion;Decreased activity tolerance;Impaired balance (sitting and/or standing);Impaired vision/perception;Decreased coordination;Decreased cognition;Decreased safety awareness;Decreased knowledge of use of DME or AE;Impaired UE functional use;Impaired sensation      OT Treatment/Interventions: Self-care/ADL training;Neuromuscular education;Energy conservation;Manual therapy;Therapeutic activities;Cognitive remediation/compensation;Balance training;Patient/family education;Therapeutic exercise;DME and/or AE instruction;Visual/perceptual remediation/compensation    OT Goals(Current goals can be found in the care plan section) Acute Rehab OT Goals Patient Stated Goal: to eat OT Goal Formulation: With patient Time For Goal Achievement: 09/27/23 ADL Goals Pt Will Perform Eating: with supervision;sitting Pt Will Perform Grooming: with supervision;sitting Pt Will Perform Upper Body Dressing: with min assist;sitting Additional ADL Goal #1: pt will complete bed mobility with mod A as a precursor to ADLs Additional ADL Goal #2: Pt will locate ADL items located in her L environment with moderate cues for scanning strategies  OT Frequency: Min 1X/week    Co-evaluation PT/OT/SLP Co-Evaluation/Treatment: Yes Reason for Co-Treatment: Complexity of the patient's impairments (multi-system involvement);For patient/therapist safety;To address functional/ADL transfers PT goals addressed during session: Mobility/safety with mobility;Balance OT goals addressed during session: ADL's and self-care      AM-PAC OT "6 Clicks" Daily Activity     Outcome Measure Help from another person  eating meals?: A Little Help from another person taking care of personal grooming?: A Lot Help from another person toileting, which includes using toliet, bedpan, or urinal?: Total Help from another person bathing (including washing, rinsing, drying)?: A Lot Help from another person to put on and taking off regular upper body clothing?: A Lot Help from another person to put on and taking off regular lower body clothing?: Total 6 Click Score: 11   End of Session Equipment Utilized During Treatment: Other (comment) Nurse Communication: Other (comment)  Activity Tolerance: Patient tolerated treatment well Patient left: in bed;with nursing/sitter in room;with family/visitor present  OT Visit Diagnosis: Muscle weakness (generalized) (M62.81);History of falling (Z91.81);Cognitive communication deficit (R41.841);Hemiplegia and hemiparesis Symptoms and signs involving cognitive functions: Cerebral infarction Hemiplegia - Right/Left: Left Hemiplegia - dominant/non-dominant: Non-Dominant Hemiplegia - caused by: Cerebral infarction                Time: 2130-8657 OT Time Calculation (min): 22 min Charges:  OT General Charges $OT Visit: 1 Visit OT Evaluation $OT Eval Moderate Complexity: 1 Mod  Derenda Mis, OTR/L Acute Rehabilitation Services Office [phone] Secure Chat Communication Preferred  Donia Pounds 09/13/2023, 2:05 PM

## 2023-09-13 NOTE — Progress Notes (Addendum)
STROKE TEAM PROGRESS NOTE   BRIEF HPI [name] is a 68 y.o. female with history of anemia, anxiety, depression, GERD, bilateral AKA's, hyperlipidemia, hypertension, MI, PAD, atrial fibrillation, pacemaker, rheumatoid arthritis, stroke and syndrome, lupus, multiple strokes and Takotsubo cardiomyopathy presenting with slurred speech, aphasia, left-sided facial droop and left arm weakness which were present when she woke up yesterday.  CT angiogram revealed right M2 occlusion and infarct with small core and large penumbra.  However, patient declined mechanical thrombectomy.  NIH on Admission 9   SIGNIFICANT HOSPITAL EVENTS 1/15-patient admitted, found to have right M2 occlusion but declined mechanical thrombectomy  INTERIM HISTORY/SUBJECTIVE Patient has been hemodynamically stable on the high end.  She is unable to have MRI, awaiting follow-up CT scan.  She continues to have severe dysarthria with mild expressive aphasia and dense left hemiplegia. CT head yesterday on admission showed encephalomalacia in the right basal ganglia but no acute abnormality. CT angiogram showed occlusion of the right M2 insular branch and right A2 and CT perfusion shows small and infarct core of 5 mL with significant penumbra 144 cc. OBJECTIVE  CBC    Component Value Date/Time   WBC 4.4 09/12/2023 1225   RBC 5.24 (H) 09/12/2023 1225   HGB 13.9 09/12/2023 1232   HGB 14.2 10/09/2018 1125   HCT 41.0 09/12/2023 1232   HCT 44.6 10/09/2018 1125   PLT 335 09/12/2023 1225   PLT 289 10/09/2018 1125   MCV 77.7 (L) 09/12/2023 1225   MCV 79 10/09/2018 1125   MCH 23.9 (L) 09/12/2023 1225   MCHC 30.7 09/12/2023 1225   RDW 16.9 (H) 09/12/2023 1225   RDW 14.8 10/09/2018 1125   LYMPHSABS 1.4 09/12/2023 1225   MONOABS 0.4 09/12/2023 1225   EOSABS 0.1 09/12/2023 1225   BASOSABS 0.1 09/12/2023 1225    BMET    Component Value Date/Time   NA 139 09/12/2023 1232   NA 135 10/09/2018 1125   K 3.9 09/12/2023  1232   CL 108 09/12/2023 1232   CO2 23 09/12/2023 1225   GLUCOSE 97 09/12/2023 1232   BUN 10 09/12/2023 1232   BUN 10 10/09/2018 1125   CREATININE 0.90 09/12/2023 1232   CREATININE 1.13 (H) 08/03/2016 1440   CALCIUM 9.0 09/12/2023 1225   GFRNONAA >60 09/12/2023 1225    IMAGING past 24 hours ECHOCARDIOGRAM COMPLETE Result Date: 09/13/2023    ECHOCARDIOGRAM REPORT   Patient Name:   Mercedes [name] Date of Exam: 09/13/2023 Medical Rec #:  [id number]         Height:       65.0 in Accession #:    [id number]        Weight:       90.4 lb Date of Birth:  Nov 20, 1955         BSA:          1.411 m Patient Age:    67 years          BP:           171/99 mmHg Patient Gender: F                 HR:           60 bpm. Exam Location:  Inpatient Procedure: 2D Echo, Cardiac Doppler and Color Doppler Indications:    Stroke  History:        Patient has prior history of Echocardiogram examinations, most  recent 08/29/2022. Risk Factors:Hypertension, Diabetes,                 Dyslipidemia and Former Smoker.  Sonographer:    Karma Ganja Referring Phys: [id number] Cecille Po MELVIN  Sonographer Comments: Technically challenging study due to limited acoustic windows. IMPRESSIONS  1. Left ventricular ejection fraction, by estimation, is 55 to 60%. The left ventricle has normal function. The left ventricle demonstrates regional wall motion abnormalities (see scoring diagram/findings for description). There is moderate asymmetric left ventricular hypertrophy of the basal-septal segment. Left ventricular diastolic parameters are consistent with Grade I diastolic dysfunction (impaired relaxation).  2. Right ventricular systolic function is normal. The right ventricular size is normal.  3. The mitral valve is normal in structure. Trivial mitral valve regurgitation. No evidence of mitral stenosis.  4. The aortic valve is normal in structure. There is mild calcification of the aortic valve. Aortic valve regurgitation is not  visualized. Aortic valve sclerosis/calcification is present, without any evidence of aortic stenosis.  5. The inferior vena cava is normal in size with greater than 50% respiratory variability, suggesting right atrial pressure of 3 mmHg. Conclusion(s)/Recommendation(s): Overall EF appears normal. Likely subtle inferoapical hypokinesis. FINDINGS  Left Ventricle: Left ventricular ejection fraction, by estimation, is 55 to 60%. The left ventricle has normal function. The left ventricle demonstrates regional wall motion abnormalities. The left ventricular internal cavity size was normal in size. There is moderate asymmetric left ventricular hypertrophy of the basal-septal segment. Left ventricular diastolic parameters are consistent with Grade I diastolic dysfunction (impaired relaxation).  LV Wall Scoring: The apical inferior segment is hypokinetic. Right Ventricle: The right ventricular size is normal. No increase in right ventricular wall thickness. Right ventricular systolic function is normal. Left Atrium: Left atrial size was normal in size. Right Atrium: Right atrial size was normal in size. Pericardium: There is no evidence of pericardial effusion. Mitral Valve: The mitral valve is normal in structure. Trivial mitral valve regurgitation. No evidence of mitral valve stenosis. MV peak gradient, 2.5 mmHg. The mean mitral valve gradient is 1.0 mmHg. Tricuspid Valve: The tricuspid valve is normal in structure. Tricuspid valve regurgitation is not demonstrated. No evidence of tricuspid stenosis. Aortic Valve: The aortic valve is normal in structure. There is mild calcification of the aortic valve. Aortic valve regurgitation is not visualized. Aortic valve sclerosis/calcification is present, without any evidence of aortic stenosis. Aortic valve mean gradient measures 2.0 mmHg. Aortic valve peak gradient measures 3.7 mmHg. Aortic valve area, by VTI measures 2.77 cm. Pulmonic Valve: The pulmonic valve was normal in  structure. Pulmonic valve regurgitation is not visualized. No evidence of pulmonic stenosis. Aorta: The aortic root is normal in size and structure. Venous: The inferior vena cava is normal in size with greater than 50% respiratory variability, suggesting right atrial pressure of 3 mmHg. IAS/Shunts: No atrial level shunt detected by color flow Doppler.  LEFT VENTRICLE PLAX 2D LVIDd:         4.30 cm   Diastology LVIDs:         3.30 cm   LV e' medial:    5.22 cm/s LV PW:         1.10 cm   LV E/e' medial:  9.3 LV IVS:        1.70 cm   LV e' lateral:   6.20 cm/s LVOT diam:     2.00 cm   LV E/e' lateral: 7.8 LV SV:         49 LV SV  Index:   35 LVOT Area:     3.14 cm  RIGHT VENTRICLE            IVC RV Basal diam:  3.00 cm    IVC diam: 1.20 cm RV S prime:     8.49 cm/s TAPSE (M-mode): 1.2 cm LEFT ATRIUM             Index        RIGHT ATRIUM           Index LA diam:        3.90 cm 2.76 cm/m   RA Area:     12.60 cm LA Vol (A2C):   48.8 ml 34.57 ml/m  RA Volume:   31.70 ml  22.46 ml/m LA Vol (A4C):   18.1 ml 12.82 ml/m LA Biplane Vol: 29.5 ml 20.90 ml/m  AORTIC VALVE AV Area (Vmax):    2.84 cm AV Area (Vmean):   2.52 cm AV Area (VTI):     2.77 cm AV Vmax:           96.10 cm/s AV Vmean:          58.500 cm/s AV VTI:            0.177 m AV Peak Grad:      3.7 mmHg AV Mean Grad:      2.0 mmHg LVOT Vmax:         86.80 cm/s LVOT Vmean:        47.000 cm/s LVOT VTI:          0.156 m LVOT/AV VTI ratio: 0.88  AORTA Ao Root diam: 3.40 cm MITRAL VALVE MV Area (PHT): 3.60 cm    SHUNTS MV Area VTI:   2.31 cm    Systemic VTI:  0.16 m MV Peak grad:  2.5 mmHg    Systemic Diam: 2.00 cm MV Mean grad:  1.0 mmHg MV Vmax:       0.79 m/s MV Vmean:      49.0 cm/s MV Decel Time: 211 msec MV E velocity: 48.40 cm/s MV A velocity: 60.00 cm/s MV E/A ratio:  0.81 Arvilla Meres MD Electronically signed by Arvilla Meres MD Signature Date/Time: 09/13/2023/11:06:01 AM    Final    DG Swallowing Func-Speech Pathology Result Date:  09/13/2023 Table formatting from the original result was not included. Modified Barium Swallow Study Patient Details Name: Mercedes [name] MRN: [id number] Date of Birth: 07/09/1956 Today's Date: 09/13/2023 HPI/PMH: HPI: Mercedes [name] is a 68 yo female presenting to ED 1/15 with slurred speech, L facial droop, and L sided weakness/numbness. CTA shows R MCA and ACA occlusions and infarct core in R posterior frontal periventricular white matter with penumbra throughout the posterior R MCA. Was recommended to undergo emergent thrombectomy but pt and her family refused. Previously seen by SLP January 2024 with oropharyngeal dysphagia and cognitive deficits. MBS 09/13/22 showed continued aspiration of thin liquids and recommended a Dys 2 diet with nectar thick liquid. PMH includes anemia, anxiety, CAD, GERD, HLD, HTN, MI, PAD, paroxysmal A-fib, RA, Sjogren's syndrome, previous CVA (R ICA 08/2022) with significant residual L sided weakness, tachy-brady syndrome, bilateral BKA Clinical Impression: Clinical Impression: Imaging was not recorded so this study is documented based on SLPs initial interpretation in real time. Pt demonstrates a primary oral dysphagia with significant left CN VII and CN XII weakness. Pt unable to achieve volitional lingual movement, but uses secondary movement, rocking, posterior tilt and face wiping to elicit sensation and subsequent  movement. Her impairments are again severe, but seem to be similar in nature to prior admissions and strokes, so she is quickly using strategies that have worked in the past. Pt has anterior loss and oral residue with liquids and thin and nectar thick liquids are aspirated before/during the swallow due to slightly late airway closure. Controlling bolus size with teaspoon is again most effective. There is no pharyngeal residue. When given honey and puree teaspoon pt has a more cohesive oral phase and there is no penetraiton or aspiration. Pt seems to improve as study  progressess. Will benefit from initaiting a conservative diet for practice puree/honey but has good potential to upgrade as she has done in the past. She is unhappy about purees and wants more texture in the future, but during the study, increased texture significantly delayed oral transit and increased oral residue. Factors that may increase risk of adverse event in presence of aspiration Rubye Oaks [employer] Clearance Coots 2021): No data recorded Recommendations/Plan: Swallowing Evaluation Recommendations Swallowing Evaluation Recommendations Recommendations: PO diet PO Diet Recommendation: Dysphagia 1 (Pureed); Moderately thick liquids (Level 3, honey thick) Liquid Administration via: Spoon Medication Administration: Crushed with puree Supervision: Staff to assist with self-feeding Swallowing strategies  : Slow rate; Small bites/sips; Check for pocketing or oral holding; Check for anterior loss Postural changes: Position pt fully upright for meals; Stay upright 30-60 min after meals Oral care recommendations: Oral care before PO Caregiver Recommendations: Have oral suction available Treatment Plan Treatment Plan Treatment recommendations: Therapy as outlined in treatment plan below Follow-up recommendations: Home health SLP Functional status assessment: Patient has had a recent decline in their functional status and demonstrates the ability to make significant improvements in function in a reasonable and predictable amount of time. Treatment frequency: Min 2x/week Treatment duration: 2 weeks Interventions: Patient/family education; Aspiration precaution training; Trials of upgraded texture/liquids; Diet toleration management by SLP Recommendations Recommendations for follow up therapy are one component of a multi-disciplinary discharge planning process, led by the attending physician.  Recommendations may be updated based on patient status, additional functional criteria and insurance authorization. Assessment: Orofacial Exam:  Orofacial Exam Oral Cavity: Oral Hygiene: Pooled secretions Oral Cavity - Dentition: Adequate natural dentition Orofacial Anatomy: WFL Oral Motor/Sensory Function: Suspected cranial nerve impairment CN V - Trigeminal: WFL CN VII - Facial: Left motor impairment CN XII - Hypoglossal: Left motor impairment; Right motor impairment Anatomy: Anatomy: WFL Boluses Administered: Boluses Administered Boluses Administered: Thin liquids (Level 0); Mildly thick liquids (Level 2, nectar thick); Moderately thick liquids (Level 3, honey thick); Puree  Oral Impairment Domain: Oral Impairment Domain Lip Closure: Escape beyond mid-chin Tongue control during bolus hold: Cohesive bolus between tongue to palatal seal Bolus preparation/mastication: Minimal chewing/mashing with majority of bolus unchewed Bolus transport/lingual motion: Minimal-no tongue motion Oral residue: Residue collection on oral structures Location of oral residue : Tongue; Palate Initiation of pharyngeal swallow : Pyriform sinuses  Pharyngeal Impairment Domain: Pharyngeal Impairment Domain Soft palate elevation: No bolus between soft palate (SP)/pharyngeal wall (PW) Laryngeal elevation: Complete superior movement of thyroid cartilage with complete approximation of arytenoids to epiglottic petiole Anterior hyoid excursion: Complete anterior movement Epiglottic movement: Complete inversion Laryngeal vestibule closure: Complete, no air/contrast in laryngeal vestibule Pharyngeal stripping wave : Present - complete Pharyngoesophageal segment opening: Complete distension and complete duration, no obstruction of flow Tongue base retraction: No contrast between tongue base and posterior pharyngeal wall (PPW) Pharyngeal residue: Complete pharyngeal clearance  Esophageal Impairment Domain: No data recorded Pill: No data recorded Penetration/Aspiration Scale Score: Penetration/Aspiration  Scale Score 1.  Material does not enter airway: Thin liquids (Level 0); Mildly thick  liquids (Level 2, nectar thick); Moderately thick liquids (Level 3, honey thick); Puree 4.  Material enters airway, CONTACTS cords then ejected out: Mildly thick liquids (Level 2, nectar thick); Thin liquids (Level 0) 8.  Material enters airway, passes BELOW cords without attempt by patient to eject out (silent aspiration) : Thin liquids (Level 0); Mildly thick liquids (Level 2, nectar thick) Compensatory Strategies: Compensatory Strategies Compensatory strategies: No   General Information: Caregiver present: No  Diet Prior to this Study: NPO   Temperature : Normal   Respiratory Status: WFL   Supplemental O2: None (Room air)   History of Recent Intubation: No  Behavior/Cognition: Alert; Cooperative Self-Feeding Abilities: Able to self-feed Baseline vocal quality/speech: Other (comment) (wet) Volitional Cough: Able to elicit Volitional Swallow: Able to elicit Exam Limitations: Excessive movement Goal Planning: Prognosis for improved oropharyngeal function: Good No data recorded No data recorded Patient/Family Stated Goal: coffee Consulted and agree with results and recommendations: Patient Pain: Pain Assessment Pain Assessment: No/denies pain End of Session: Start Time:SLP Start Time (ACUTE ONLY): 0935 Stop Time: SLP Stop Time (ACUTE ONLY): 0956 Time Calculation:SLP Time Calculation (min) (ACUTE ONLY): 21 min Charges: SLP Evaluations $ SLP Speech Visit: 1 Visit SLP Evaluations $BSS Swallow: 1 Procedure $MBS Swallow: 1 Procedure $Swallowing Treatment: 1 Procedure SLP visit diagnosis: SLP Visit Diagnosis: Dysphagia, oral phase (R13.11) Past Medical History: Past Medical History: Diagnosis Date  Abnormal LFTs   a. in the past when on Lipitor  Acute ischemic right ICA stroke (HCC) 08/28/2022  Anemia   as a young woman  Anxiety   Arnold-Chiari malformation, type I (HCC)   MRI brain 02/2010  Atrial flutter with rapid ventricular response vs. SVT 10/27/2014  CAD (coronary artery disease)   a. NSTEMI 07/2009: LHC - D2 40%,  LAD irreg., EF 50% with apical AK (tako-tsubo CM);  b. Inf STEMI (04/2013): Tx Promus DES to CFX;  c. 08/2013 Lexi CL: No ischemia, dist ant/ap/inf-ap infarct, EF  d. 06/2016 NSTEMI with LM disease: s/p CABG on 07/17/2016 w/ LIMA-LAD, SVG-OM, and SVG-dRCA.  DEPRESSION   Dizziness   ? 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 anticoagulated with Eliquis 10/2014  Presence of permanent cardiac pacemaker 10/2014  leadless permanent pacemaker  RA (rheumatoid arthritis) (HCC)   prior tx by Dr. Dareen Piano  Sjogren's syndrome (HCC) 06/02/2013  pt denies this (12/01/13)  SLE (systemic lupus erythematosus) (HCC) dx 05/2013  follows with rheum Dareen Piano)  Stroke [facility])   March 2023, weakness in right arm is improving  Tachy-brady syndrome (HCC)   a. 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  f/u echo 09/2009: EF 50-55%, mild LVH, mod diast dysfxn, mild apical HK  Wears dentures   Wears glasses  Past Surgical History: Past Surgical History: Procedure Laterality Date  ABDOMINAL AORTAGRAM N/A 11/24/2013  Procedure: ABDOMINAL AORTAGRAM WITH BILATERAL RUNOFF WITH POSSIBLE INTERVENTION;  Surgeon: Chuck Hint, MD;  Location: [name] CATH LAB;  Service: Cardiovascular;  Laterality: N/A;  ABDOMINAL AORTOGRAM W/LOWER EXTREMITY N/A 04/25/2019  Procedure:  ABDOMINAL AORTOGRAM W/LOWER EXTREMITY;  Surgeon: Sherren Kerns, MD;  Location: [facility] INVASIVE CV LAB;  Service: Cardiovascular;  Laterality: N/A;  ABDOMINAL AORTOGRAM W/LOWER EXTREMITY Bilateral 05/15/2019  Procedure: ABDOMINAL AORTOGRAM W/LOWER EXTREMITY;  Surgeon: Nada Libman, MD;   Location: [facility] INVASIVE CV LAB;  Service: Cardiovascular;  Laterality: Bilateral;  ABDOMINAL AORTOGRAM W/LOWER EXTREMITY N/A 08/23/2020  Procedure: ABDOMINAL AORTOGRAM W/LOWER EXTREMITY;  Surgeon: Maeola Harman, MD;  Location: [facility] INVASIVE CV LAB;  Service: Cardiovascular;  Laterality: N/A;  ABDOMINAL AORTOGRAM W/LOWER EXTREMITY N/A 10/29/2020  Procedure: ABDOMINAL AORTOGRAM W/LOWER EXTREMITY;  Surgeon: Sherren Kerns, MD;  Location: [facility] INVASIVE CV LAB;  Service: Cardiovascular;  Laterality: N/A;  AMPUTATION Left 12/02/2013  Procedure: LEFT 1ST TOE AMPUTATION;  Surgeon: Sherren Kerns, MD;  Location: [facility] OR;  Service: Vascular;  Laterality: Left;  AMPUTATION Left 07/16/2019  Procedure: LEFT TRANSMETATARSAL AMPUTATION;  Surgeon: Nadara Mustard, MD;  Location: [facility] OR;  Service: Orthopedics;  Laterality: Left;  AMPUTATION Left 10/01/2019  Procedure: LEFT AMPUTATION ABOVE KNEE;  Surgeon: Sherren Kerns, MD;  Location: [facility] OR;  Service: Vascular;  Laterality: Left;  AMPUTATION Right 08/31/2020  Procedure: Right first toe amputation;  Surgeon: Sherren Kerns, MD;  Location: [facility] OR;  Service: Vascular;  Laterality: Right;  AMPUTATION Right 11/23/2020  Procedure: RIGHT AMPUTATION ABOVE KNEE;  Surgeon: Sherren Kerns, MD;  Location: [facility] OR;  Service: Vascular;  Laterality: Right;  AMPUTATION Left 03/14/2022  Procedure: REVISION LEFT ABOVE KNEE AMPUTATION;  Surgeon: Maeola Harman, MD;  Location: [facility] OR;  Service: Vascular;  Laterality: Left;  APPLICATION OF WOUND VAC Left 11/03/2019  Procedure: Application Of Wound Vac, left lower extremity;  Surgeon: Sherren Kerns, MD;  Location: [facility] OR;  Service: Vascular;  Laterality: Left;  CARDIAC CATHETERIZATION N/A 07/12/2016  Procedure: Left Heart Cath and Coronary Angiography;  Surgeon: Lennette Bihari, MD;  Location: [facility] INVASIVE CV LAB;  Service: Cardiovascular;  Laterality: N/A;  CARDIAC CATHETERIZATION N/A 07/14/2016  Procedure: Intravascular Ultrasound/IVUS;   Surgeon: Yvonne Kendall, MD;  Location: [facility] INVASIVE CV LAB;  Service: Cardiovascular;  Laterality: N/A;  COLONOSCOPY    CORONARY ANGIOPLASTY  04/2013  CORONARY ARTERY BYPASS GRAFT N/A 07/17/2016  Procedure: CORONARY ARTERY BYPASS GRAFTING (CABG)x3 using right greater saphenous vein harvested endoscopiclly and left internal mammary artery;  Surgeon: Kerin Perna, MD;  Location: [facility] OR;  Service: Open Heart Surgery;  Laterality: N/A;  ENDARTERECTOMY FEMORAL Left 05/12/2019  Procedure: LEFT Femoral Endarterectomy;  Surgeon: Sherren Kerns, MD;  Location: [facility] OR;  Service: Vascular;  Laterality: Left;  FEMORAL-POPLITEAL BYPASS GRAFT Left 12/02/2013  Procedure:  LEFT FEMORAL-BELOW KNEE POPLITEAL ARTERY BYPASS GRAFT;  Surgeon: Sherren Kerns, MD;  Location: [facility] OR;  Service: Vascular;  Laterality: Left;  FEMORAL-POPLITEAL BYPASS GRAFT Left 09/25/2019  Procedure: THROMBECTOMY and PROPATEN BYPASS  FEMORAL TO BELOW KNEE POPLITEAL ARTERY BYPASS GRAFT LEFT LEG;  Surgeon: Larina Earthly, MD;  Location: [facility] OR;  Service: Vascular;  Laterality: Left;  FEMORAL-POPLITEAL BYPASS GRAFT Right 08/24/2020  Procedure: RIGHT FEMORAL-BELOW KNEE POPLITEAL ARTERY BYPASS USING GORE PROPATEN GRAFT;  Surgeon: Maeola Harman, MD;  Location: [facility] OR;  Service: Vascular;  Laterality: Right;  FEMORAL-POPLITEAL BYPASS GRAFT Right 11/01/2020  Procedure: Thrombectomy and Revision of Right Femoral to below knee Bypass with Interpostional graft to Tibial / peroneal Trunk and Endarterectomy Tibial Weston Settle Trunk.;  Surgeon: Sherren Kerns, MD;  Location: [facility] OR;  Service: Vascular;  Laterality: Right;  INTRAOPERATIVE ARTERIOGRAM Right 11/01/2020  Procedure:  ANTEGRADE RIGHT LOWER EXTREMITY INTRA OPERATIVE ARTERIOGRAM;  Surgeon: Sherren Kerns, MD;  Location: [facility] OR;  Service: Vascular;  Laterality: Right;  IR CT HEAD LTD  08/28/2022  IR PERCUTANEOUS ART THROMBECTOMY/INFUSION INTRACRANIAL INC DIAG ANGIO  08/28/2022  IR US GUIDE VASC ACCESS RIGHT   08/28/2022  LEFT HEART CATHETERIZATION WITH CORONARY ANGIOGRAM N/A 05/19/2013  Procedure: LEFT HEART CATHETERIZATION WITH CORONARY ANGIOGRAM;  Surgeon: Micheline Chapman, MD;  Location: [facility] CATH LAB;  Service: Cardiovascular;  Laterality: N/A;  LEFT HEART CATHETERIZATION WITH CORONARY ANGIOGRAM N/A 10/28/2014  Procedure: LEFT HEART CATHETERIZATION WITH CORONARY ANGIOGRAM;  Surgeon: Iran Ouch, MD;  Location: [facility] CATH LAB;  Service: Cardiovascular;  Laterality: N/A;  LOWER EXTREMITY ANGIOGRAM Left 05/12/2019  Procedure: Lower Extremity Angiogram;  Surgeon: Sherren Kerns, MD;  Location: [facility] OR;  Service: Vascular;  Laterality: Left;  PATCH ANGIOPLASTY Left 05/12/2019  Procedure: Left Femoral Patch Angioplasty USING CADAVERIC SAPHENOUS VEIN;  Surgeon: Sherren Kerns, MD;  Location: [facility] OR;  Service: Vascular;  Laterality: Left;  PERCUTANEOUS CORONARY STENT INTERVENTION (PCI-S)  05/19/2013  Procedure: PERCUTANEOUS CORONARY STENT INTERVENTION (PCI-S);  Surgeon: Micheline Chapman, MD;  Location: [facility] CATH LAB;  Service: Cardiovascular;;  PERIPHERAL VASCULAR INTERVENTION Right 10/29/2020  Procedure: PERIPHERAL VASCULAR INTERVENTION;  Surgeon: Sherren Kerns, MD;  Location: [facility] INVASIVE CV LAB;  Service: Cardiovascular;  Laterality: Right;  common iliac  PERMANENT PACEMAKER INSERTION N/A 11/04/2014  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  RADIOLOGY WITH ANESTHESIA N/A 08/28/2022  Procedure: RADIOLOGY WITH ANESTHESIA;  Surgeon: Radiologist, Medication, MD;  Location: [facility] OR;  Service: Radiology;  Laterality: N/A;  RADIOLOGY WITH ANESTHESIA N/A 09/12/2023  Procedure: RADIOLOGY WITH ANESTHESIA;  Surgeon: Radiologist, Medication, MD;  Location: [facility] OR;  Service: Radiology;  Laterality: N/A;  TEE WITHOUT CARDIOVERSION N/A 07/17/2016  Procedure: TRANSESOPHAGEAL ECHOCARDIOGRAM (TEE);  Surgeon: Kerin Perna, MD;  Location: [facility] OR;  Service: Open Heart Surgery;  Laterality: N/A;  THROMBECTOMY FEMORAL ARTERY Left  05/12/2019  Procedure: Left Ilio-Femoral Artery and Femoral-popliteal Graft Thrombectomy;  Surgeon: Sherren Kerns, MD;  Location: [facility] OR;  Service: Vascular;  Laterality: Left;  THROMBECTOMY OF BYPASS GRAFT FEMORAL- POPLITEAL ARTERY Right 11/01/2020  Procedure: POSSIBLE THROMBOLYSIS VERSUS OPEN THROMBECTOMY AND REVSION OF RIGHT FEMORAL-POPLITEAL ARTERY BYPASS;  Surgeon: Sherren Kerns, MD;  Location: [facility] OR;  Service: Vascular;  Laterality: Right;  TUBAL LIGATION    VISCERAL ANGIOGRAPHY N/A 04/25/2019  Procedure: MESENTRIC  ANGIOGRAPHY;  Surgeon: Sherren Kerns, MD;  Location: [facility] INVASIVE CV LAB;  Service: Cardiovascular;  Laterality: N/A;  WOUND DEBRIDEMENT Left 11/03/2019  Procedure: Debridement Wound of left above the knee amputation;  Surgeon: Sherren Kerns, MD;  Location: [facility] OR;  Service: Vascular;  Laterality: Left; DeBlois, Riley Nearing 09/13/2023, 10:50 AM  DG Chest Portable 1 View Result Date: 09/12/2023 CLINICAL DATA:  Stroke workup slurred speech EXAM: PORTABLE CHEST 1 VIEW COMPARISON:  01/31/2019 FINDINGS: Post sternotomy changes. No acute airspace disease or effusion. Normal cardiomediastinal silhouette with aortic atherosclerosis. Leadless pacemaker is noted. IMPRESSION: No active disease. Electronically Signed   By: Jasmine Pang M.D.   On: 09/12/2023 15:41   CT ANGIO HEAD NECK W WO CM W PERF (CODE STROKE) Result Date: 09/12/2023 CLINICAL DATA:  Aphasia, stroke suspected EXAM: CT ANGIOGRAPHY HEAD AND NECK CT PERFUSION BRAIN TECHNIQUE: Multidetector CT imaging of the head and neck was performed using the standard protocol during bolus administration of intravenous contrast. Multiplanar CT image reconstructions and MIPs were obtained to  evaluate the vascular anatomy. Carotid stenosis measurements (when applicable) are obtained utilizing NASCET criteria, using the distal internal carotid diameter as the denominator. Multiphase CT imaging of the brain was performed following IV bolus  contrast injection. Subsequent parametric perfusion maps were calculated using RAPID software. RADIATION DOSE REDUCTION: This exam was performed according to the departmental dose-optimization program which includes automated exposure control, adjustment of the mA and/or kV according to patient size and/or use of iterative reconstruction technique. CONTRAST:  100 mL Omnipaque 350 COMPARISON:  CTA 08/28/2022, CT head 09/12/2023 FINDINGS: CT HEAD FINDINGS For noncontrast findings, please see same day CT head. CTA NECK FINDINGS Aortic arch: The arch is not included in the field of view. Right carotid system: 50% stenosis in the proximal right ICA, secondary to noncalcified plaque (series 8, image 269). Moderate stenosis at the origin of the right ECA. No evidence of dissection. Previously noted distal ICA occlusion is no longer seen. Left carotid system: 70% stenosis in the proximal left ICA (series 8, image 421). No significant stenosis at the origin of the ECA. No evidence of dissection. Vertebral arteries: No evidence of dissection, occlusion, or hemodynamically significant stenosis (greater than 50%). Skeleton: No acute osseous abnormality. Degenerative changes in the cervical spine. Other neck: 4 mm hypoenhancing nodule in the left thyroid, for which no follow-up is currently indicated. (Reference: J Am Coll Radiol. 2015 Feb;12(2): 143-50). Fluid in the esophagus, which is patulous, increasing the risk for aspiration. Upper chest: No focal pulmonary opacity or pleural effusion. Emphysema. Review of the MIP images confirms the above findings CTA HEAD FINDINGS Anterior circulation: Both internal carotid arteries are patent to the termini, with calcifications but without significant stenosis. A1 segments patent. Normal anterior communicating artery. Occlusion of the right A2 just after its origin (series 8, image 146 and 140), with distal reconstitution (series 8, image 112). The left A2 and distal left ACA branches  are patent. Mild stenosis in the proximal right M1. Occlusion of the proximal right M2 insular branch (series 8, image 138), with reconstitution in its M3 branches (series 8, image 133). The more lateral right M3 is subsequently reoccluded distally (series 8, image 124), with additional multifocal occlusion of its downstream branches (series 8, image 123 and 121). While other downstream branches of the more medial right M3 are occluded more superiorly (series 8, image 106). The anterior right M2 and is branches are patent. The left M1 and left MCA branches are patent, without significant stenosis. Posterior circulation: Vertebral arteries patent to the vertebrobasilar junction without significant stenosis. Posterior inferior cerebellar arteries patent proximally. Basilar patent to its distal aspect without significant stenosis. Superior cerebellar arteries patent proximally. Patent right P1. Fetal origin of the left PCA from the left posterior communicating artery. The right posterior communicating artery is also patent. PCAs perfused to their distal aspects without significant stenosis. Venous sinuses: As permitted by contrast timing, patent. Anatomic variants: Fetal left PCA. No evidence of aneurysm or vascular malformation. Review of the MIP images confirms the above findings CT Brain Perfusion Findings: ASPECTS: 10 CBF (<30%) Volume: 5mL Perfusion (Tmax>6.0s) volume: Mismatch Volume: Infarction Location:Infarct core in the right posterior frontal periventricular white matter, penumbra throughout the posterior right MCA territory. Code stroke imaging results were communicated on 09/12/2023 at 2:27 pm to provider Dr. Wilford Corner via telephone, who verbally acknowledged these results. IMPRESSION: 1. Occlusion of the proximal right M2 insular branch, with reconstitution in its M3 branches and multifocal more downstream occlusion and reconstitution. 2. Occlusion of  the right A2 just after its origin, with distal  reconstitution. 3. Infarct core in the right posterior frontal periventricular white matter, with penumbra throughout the posterior right MCA territory, measuring 5 mL. 4. 50% stenosis in the proximal right ICA, secondary to noncalcified plaque. 5. 70% stenosis in the proximal left ICA. 6. Fluid in the esophagus, which is patulous, increasing the risk for aspiration. Right MCA occlusion was discussed on 09/12/2023 at 2:27 pm with provider Dr. Wilford Corner via telephone, who verbally acknowledged these results. Right ACA occlusion was communicated on 09/12/2023 at 2:48 pm to provider Dr. Wilford Corner via secure text paging. Electronically Signed   By: Wiliam Ke M.D.   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  Good comprehension  Cranial Nerves:  II: PERRL.  Does not blink to threat on the left III, IV, VI: Right gaze preference, able to go to midline VII: Left facial droop VIII: hearing intact to voice. IX, X: Voice is dysarthric XII: tongue is midline without fasciculations. Motor: Able to move right upper and lower extremities with full antigravity strength, some antigravity strength, 3/5 in left upper and lower extremities Tone: is normal and bulk is normal Sensation- Intact to light touch bilaterally.   Coordination: Unable to perform Gait- deferred  Most Recent NIH  1a Level of Conscious.: 0 1b LOC Questions: 2 1c LOC Commands: 0 2 Best Gaze: 1 3 Visual: 1 4 Facial Palsy: 1 5a Motor Arm - left: 2 5b Motor Arm - Right: 0 6a Motor Leg - Left: 2 6b Motor Leg - Right: 0 7 Limb Ataxia: 0 8 Sensory: 0 9 Best Language: 2 10 Dysarthria: 2 11 Extinct. 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  Encephalomalacia in right lentiform nucleus and external capsule ASPECTS 10.    CTA head & neck lesion of proximal right M2 insular branch with reconstitution and M3 branches, occlusion of right A2 just after its origin, 50% stenosis of proximal right ICA, 70% stenosis of proximal left ICA CT perfusion 5 mL infarct core in right posterior frontal periventricular white matter with large penumbra Follow-up CT 1/16 pending MRI unable to perform due to pacemaker 2D Echo EF 55 to 60%, moderate LVH of the basal septal segment, grade 1 diastolic dysfunction, normal left atrial size, no atrial level shunt LDL 83 HgbA1c 6.0 VTE prophylaxis -fully anticoagulated with Eliquis Eliquis (apixaban) daily prior to admission, Eliquis now on hold pending follow-up CT Therapy recommendations:  SNF Disposition: Pending  Hx of Stroke/TIA Patient has a 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 status post mechanical thrombectomy Patient also had a stroke in 3/23, also likely embolic  Atrial fibrillation Home Meds: Eliquis Continue telemetry monitoring Continue Eliquis if follow-up CT shows no hemorrhagic transformation  Hypertension Home meds: Nifedipine 30 mg daily Stable Blood Pressure Goal: BP less than 220/110    Hyperlipidemia Home meds: Rosuvastatin 40 mg daily, resumed in hospital LDL 83, goal < 70 Add ezetimibe 10 mg daily  Continue statin at discharge  Diabetes type II Controlled Home meds: None HgbA1c 6.0, goal < 7.0 CBGs SSI Recommend close follow-up with PCP for better DM control   Dysphagia Patient has post-stroke dysphagia, SLP consulted    Diet   DIET - DYS 1 Room service appropriate? No; Fluid consistency: Honey Thick   Advance diet as tolerated  Other Stroke Risk Factors Coronary artery disease   Other Active Problems History of Takotsubo cardiomyopathy-EF 55 to 60% at this time  Hospital day # 1  Patient seen by NP with MD, MD to edit note as needed. Cortney E Ernestina Columbia , MSN, AGACNP-BC [facility] See Amion for schedule and pager information 09/13/2023 1:34 PM   I have personally obtained history,examined this patient, reviewed notes, independently viewed imaging studies, participated in medical decision making and plan of care.ROS completed by me personally and pertinent positives fully documented  I have made any additions or clarifications directly to the above note. Agree with note above.  Patient presented with left hemiparesis and speech difficulties due to right M2 and A2 occlusion and CT perfusion showed a small cord with significant penumbra but unfortunately patient declined mechanical thrombectomy and was not candidate for TNK due to being on Eliquis.  Continue ongoing stroke workup.  Repeat CT scan of the head in 24 hours as patient cannot have an MRI.  Mobilize out of bed.  Physical occupational speech therapy consults.  Will resume Eliquis after CT head as there is no definite data to suggest changing anticoagulation is necessary a better option.  No family at the bedside.  Discussed with Dr. Jerral Ralph.  Greater than 50% time during this 50-minute visit was spent on counseling and coordination of care about the embolic stroke and left  hemiparesis and atrial fibrillation and discussion about anticoagulation options and answering questions  Delia Heady, MD Medical Director [facility] Pager: [phone] 09/13/2023 4:01 PM   To contact Stroke Continuity provider, please refer to [email or web address]. After hours, contact General Neurology

## 2023-09-13 NOTE — Progress Notes (Signed)
  Echocardiogram 2D Echocardiogram has been performed.  Reinaldo Raddle Shawntez Dickison 09/13/2023, 9:14 AM

## 2023-09-13 NOTE — Plan of Care (Signed)
Same-day progress note  I was contacted by the ER nurse with the patient the evening of 09/12/2023 regarding family being at bedside and inquiring about the thrombectomy procedure. I spoke with the sister over the phone.  Explained to her that she is now outside the window for treatment and this would be outside of standard of care to even consider going for thrombectomy, if the patient were to consent.  The patient was very clear that she does not want any procedures done.  The family said that they were not very clear on the procedure details over the phone as there were multiple family members on conference calls and they could not get to a consensus at that point.  I explained to the family as to the fact that we have to respect the patient's wishes as well as her not being outside the window would preclude her from intervention.  Patient's RN was at bedside.  I communicated this plan to the admitting hospitalist Dr. Alinda Money and the on-call neurologist Dr. Amada Jupiter.  -- Milon Dikes, MD Neurologist [facility]

## 2023-09-13 NOTE — Evaluation (Signed)
Speech Language Pathology Evaluation Patient Details Name: Mercedes [name] MRN: [id number] DOB: [birth year] Today's Date: 09/13/2023 Time: 1340-1400 SLP Time Calculation (min) (ACUTE ONLY): 20 min  Problem List:  Patient Active Problem List   Diagnosis Date Noted   Acute ischemic right MCA stroke (HCC) 09/12/2023   Acute CVA (cerebrovascular accident) (HCC) 09/12/2023   Ulcer of right groin, limited to breakdown of skin (HCC) 07/31/2023   Sjogren's disease (HCC) 09/18/2022   Dysphagia as late effect of cerebrovascular accident (CVA) 09/18/2022   Protein-calorie malnutrition, severe 08/30/2022   History of stroke 12/14/2021   Ulceration of stump of above knee amputation (HCC) 11/09/2021   S/P AKA (above knee amputation), right (HCC), 11/23/20 12/14/2020   S/P AKA (above knee amputation), left (HCC), 10/01/19 10/20/2019   Femoral-popliteal bypass graft occlusion, left (HCC) 09/25/2019   Renal infarct (HCC) 02/01/2019   Diabetes (HCC) 04/01/2017   Hyperthyroidism 02/21/2017   Hx of CABG 07/17/2016   Paroxysmal atrial fibrillation (HCC) 07/12/2016   Phantom limb pain-Left great toe 04/29/2015   Seizure (HCC)    Atherosclerosis of native artery of left lower extremity with rest pain (HCC) 12/18/2013   PAD (peripheral artery disease) (HCC) 12/02/2013   SLE (systemic lupus erythematosus) (HCC)    CAD - S/P Takotsubo MI in 2000, CFX DES Sept 2014 05/25/2013   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   Tachycardia-bradycardia (HCC) 03/09/2010   PVD- s/p Lt [facility] April 2015 10/29/2009   Dyslipidemia 08/26/2009   Essential hypertension 08/26/2009   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   Rheumatoid arthritis (HCC) 08/26/2009   Past Medical History:  Past Medical History:  Diagnosis Date   Abnormal LFTs    a. in the past when on Lipitor   Acute ischemic right ICA stroke (HCC) 08/28/2022   Anemia    as a young woman   Anxiety    Arnold-Chiari malformation, type I (HCC)    MRI brain  02/2010   Atrial flutter with rapid ventricular response vs. SVT 10/27/2014   CAD (coronary artery disease)    a. NSTEMI 07/2009: LHC - D2 40%, LAD irreg., EF 50% with apical AK (tako-tsubo CM);  b. Inf STEMI (04/2013): Tx Promus DES to CFX;  c. 08/2013 Lexi CL: No ischemia, dist ant/ap/inf-ap infarct, EF  d. 06/2016 NSTEMI with LM disease: s/p CABG on 07/17/2016 w/ LIMA-LAD, SVG-OM, and SVG-dRCA.   DEPRESSION    Dizziness    ? 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 anticoagulated with Eliquis 10/2014   Presence of permanent cardiac pacemaker 10/2014   leadless permanent pacemaker   RA (rheumatoid arthritis) (HCC)    prior tx by Dr. Dareen Piano   Sjogren's syndrome (HCC) 06/02/2013   pt denies this (12/01/13)   SLE (systemic lupus erythematosus) (HCC) dx 05/2013   follows with rheum Dareen Piano)   Stroke [facility])    March 2023, weakness in right arm is improving   Tachy-brady syndrome (HCC)    a. 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   f/u echo 09/2009: EF 50-55%, mild LVH,  mod diast dysfxn, mild apical HK   Wears dentures    Wears glasses    Past Surgical History:  Past Surgical History:  Procedure Laterality Date   ABDOMINAL AORTAGRAM N/A 11/24/2013   Procedure: ABDOMINAL AORTAGRAM WITH BILATERAL RUNOFF WITH POSSIBLE INTERVENTION;  Surgeon: Chuck Hint, MD;  Location: [facility] CATH LAB;  Service: Cardiovascular;  Laterality: N/A;   ABDOMINAL AORTOGRAM W/LOWER EXTREMITY N/A 04/25/2019   Procedure: ABDOMINAL AORTOGRAM W/LOWER EXTREMITY;  Surgeon: Sherren Kerns,  MD;  [facility] INVASIVE CV LAB;  Service: Cardiovascular;  Laterality: N/A;   ABDOMINAL AORTOGRAM W/LOWER EXTREMITY Bilateral 05/15/2019   Procedure: ABDOMINAL AORTOGRAM W/LOWER EXTREMITY;  Surgeon: Nada Libman, MD;  [facility] INVASIVE CV LAB;  Service: Cardiovascular;  Laterality: Bilateral;   ABDOMINAL AORTOGRAM W/LOWER EXTREMITY N/A 08/23/2020   Procedure: ABDOMINAL AORTOGRAM W/LOWER EXTREMITY;  Surgeon: Maeola Harman, MD;  Location: [facility] INVASIVE CV LAB;  Service: Cardiovascular;  Laterality: N/A;   ABDOMINAL AORTOGRAM W/LOWER EXTREMITY N/A 10/29/2020   Procedure: ABDOMINAL AORTOGRAM W/LOWER EXTREMITY;  Surgeon: Sherren Kerns, MD;  [facility] INVAS[facility] CV LAB;  Service: Cardiovascular;  Laterality: N/A;   AMPUTATION Left 12/02/2013   Procedure: LEFT 1ST TOE AMPUTATION;  Surgeon: Sherren Kerns, MD;  Location: [facility] OR;  Service: Vascular;  Laterality: Left;   AMPUTATION Left 07/16/2019   Procedure: LEFT TRANSMETATARSAL AMPUTATION;  Surgeon: Nadara Mustard, MD;  Location: [facility] OR;  Service: Orthopedics;  Laterality: Left;   AMPUTATION Left 10/01/2019   Procedure: LEFT AMPUTATION ABOVE KNEE;  Surgeon: Sherren Kerns, MD;  Location: [facility] OR;  Service: Vascular;  Laterality: Left;   AMPUTATION Right 08/31/2020   Procedure: Right first toe amputation;  Surgeon: Sherren Kerns, MD;  Location: [facility] OR;  Service: Vascular;  Laterality: Right;   AMPUTATION Right 11/23/2020   Procedure: RIGHT AMPUTATION ABOVE KNEE;  Surgeon: Sherren Kerns, MD;  Location: [facility] OR;  Service: Vascular;  Laterality: Right;   AMPUTATION Left 03/14/2022   Procedure: REVISION LEFT ABOVE KNEE AMPUTATION;  Surgeon: Maeola Harman, MD;  Location: [facility] OR;  Service: Vascular;  Laterality: Left;   APPLICATION OF WOUND VAC Left 11/03/2019   Procedure: Application Of Wound Vac, left lower extremity;  Surgeon: Sherren Kerns, MD;  Location: [facility] OR;  Service: Vascular;  Laterality: Left;   CARDIAC  CATHETERIZATION N/A 07/12/2016   Procedure: Left Heart Cath and Coronary Angiography;  Surgeon: Lennette Bihari, MD;  [facility] INV[facility] CV [facility];  Service: Cardiovascular;  Laterality: N/A;   CARDIAC CATHETERIZATION N/A 07/14/2016   Procedure: Intravascular Ultrasound/IVUS;  Surgeon: Yvonne Kendall, MD;  [facility] CV [facility];  Service: Cardiovascular;  Laterality: N/A;   COLONOSCOPY     CORONARY ANGIOPLASTY  04/2013   CORONARY ARTERY BYPASS GRAFT N/A 07/17/2016   Procedure: CORONARY ARTERY BYPASS GRAFTING (CABG)x3 using right greater saphenous vein harvested endoscopiclly and left internal mammary artery;  Surgeon: Kerin Perna, MD;  Location: [facility] OR;  Service: Open Heart Surgery;  Laterality: N/A;   ENDARTERECTOMY FEMORAL Left 05/12/2019   Procedure: LEFT Femoral Endarterectomy;  Surgeon: Sherren Kerns, MD;  Location: [facility] OR;  Service: Vascular;  Laterality: Left;   FEMORAL-POPLITEAL BYPASS GRAFT Left 12/02/2013   Procedure:  LEFT FEMORAL-BELOW KNEE POPLITEAL ARTERY BYPASS GRAFT;  Surgeon: Sherren Kerns, MD;  Location: [facility] OR;  Service: Vascular;  Laterality: Left;   FEMORAL-POPLITEAL BYPASS GRAFT Left 09/25/2019   Procedure: THROMBECTOMY and PROPATEN BYPASS  FEMORAL TO BELOW KNEE POPLITEAL ARTERY  BYPASS GRAFT LEFT LEG;  Surgeon: Larina Earthly, MD;  Location: [facility] OR;  Service: Vascular;  Laterality: Left;   FEMORAL-POPLITEAL BYPASS GRAFT Right 08/24/2020   Procedure: RIGHT FEMORAL-BELOW KNEE POPLITEAL ARTERY BYPASS USING GORE PROPATEN GRAFT;  Surgeon: Maeola Harman, MD;  Location: [facility] OR;  Service: Vascular;  Laterality: Right;   FEMORAL-POPLITEAL BYPASS GRAFT Right 11/01/2020   Procedure: Thrombectomy and Revision of Right Femoral to below knee Bypass with Interpostional graft to Tibial / peroneal Trunk and Endarterectomy Tibial Weston Settle Trunk.;  Surgeon: Sherren Kerns, MD;  Location: [facility] OR;  Service: Vascular;  Laterality: Right;   INTRAOPERATIVE ARTERIOGRAM Right  11/01/2020   Procedure: ANTEGRADE RIGHT LOWER EXTREMITY INTRA OPERATIVE ARTERIOGRAM;  Surgeon: Sherren Kerns, MD;  Location: [facility] OR;  Service: Vascular;  Laterality: Right;   IR CT HEAD LTD  08/28/2022   IR PERCUTANEOUS ART THROMBECTOMY/INFUSION INTRACRANIAL INC DIAG ANGIO  08/28/2022   IR US GUIDE VASC ACCESS RIGHT  08/28/2022   LEFT HEART CATHETERIZATION WITH CORONARY ANGIOGRAM N/A 05/19/2013   Procedure: LEFT HEART CATHETERIZATION WITH CORONARY ANGIOGRAM;  Surgeon: Micheline Chapman, MD;  Location: [facility] CATH LAB;  Service: Cardiovascular;  Laterality: N/A;   LEFT HEART CATHETERIZATION WITH CORONARY ANGIOGRAM N/A 10/28/2014   Procedure: LEFT HEART CATHETERIZATION WITH CORONARY ANGIOGRAM;  Surgeon: Iran Ouch, MD;  [facility];  Service: Cardiovascular;  Laterality: N/A;   LOWER EXTREMITY ANGIOGRAM Left 05/12/2019   Procedure: Lower Extremity Angiogram;  Surgeon: Sherren Kerns, MD;  Location: [facility] OR;  Service: Vascular;  Laterality: Left;   PATCH ANGIOPLASTY Left 05/12/2019   Procedure: Left Femoral Patch Angioplasty USING CADAVERIC SAPHENOUS VEIN;  Surgeon: Sherren Kerns, MD;  Location: [facility] OR;  Service: Vascular;  Laterality: Left;   PERCUTANEOUS CORONARY STENT INTERVENTION (PCI-S)  05/19/2013   Procedure: PERCUTANEOUS CORONARY STENT INTERVENTION (PCI-S);  Surgeon: Micheline Chapman, MD;  Location: [facility] CATH LAB;  Service: Cardiovascular;;   PERIPHERAL VASCULAR INTERVENTION Right 10/29/2020   Procedure: PERIPHERAL VASCULAR INTERVENTION;  Surgeon: Sherren Kerns, MD;  Location: [facility] INVASIVE CV LAB;  Service: Cardiovascular;  Laterality: Right;  common iliac   PERMANENT PACEMAKER INSERTION N/A 11/04/2014   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   RADIOLOGY WITH ANESTHESIA N/A 08/28/2022   Procedure: RADIOLOGY WITH ANESTHESIA;  Surgeon: Radiologist, Medication, MD;  [facility] OR;  Service: Radiology;  Laterality: N/A;   RADIOLOGY WITH ANESTHESIA N/A 09/12/2023    Procedure: RADIOLOGY WITH ANESTHESIA;  Surgeon: Radiologist, Medication, MD;  [facility] OR;  Service: Radiology;  Laterality: N/A;   TEE WITHOUT CARDIOVERSION N/A 07/17/2016   Procedure: TRANSESOPHAGEAL ECHOCARDIOGRAM (TEE);  Surgeon: Kerin Perna, MD;  Location: [facility] OR;  Service: Open Heart Surgery;  Laterality: N/A;   THROMBECTOMY FEMORAL ARTERY Left 05/12/2019   Procedure: Left Ilio-Femoral Artery and Femoral-popliteal Graft Thrombectomy;  Surgeon: Sherren Kerns, MD;  Location: [facility] OR;  Service: Vascular;  Laterality: Left;   THROMBECTOMY OF BYPASS GRAFT FEMORAL- POPLITEAL ARTERY Right 11/01/2020   Procedure: POSSIBLE THROMBOLYSIS VERSUS OPEN THROMBECTOMY AND REVSION OF RIGHT FEMORAL-POPLITEAL ARTERY BYPASS;  Surgeon: Sherren Kerns, MD;  [facility] OR;  Service: Vascular;  Laterality: Right;   TUBAL LIGATION     VISCERAL ANGIOGRAPHY N/A 04/25/2019   Procedure: MESENTRIC  ANGIOGRAPHY;  Surgeon: Sherren Kerns, MD;  [facility];  Service: Cardiovascular;  Laterality: N/A;   WOUND DEBRIDEMENT Left 11/03/2019   Procedure: Debridement Wound of left above the knee  amputation;  Surgeon: Sherren Kerns, MD;  Location: [facility] OR;  Service: Vascular;  Laterality: Left;   HPI:  GLENDY BILLINGSLY is a 68 yo female presenting to ED 1/15 with slurred speech, L facial droop, and L sided weakness/numbness. CTA shows R MCA and ACA occlusions and infarct core in R posterior frontal periventricular white matter with penumbra throughout the posterior R MCA. Was recommended to undergo emergent thrombectomy but pt and her family refused. Previously seen by SLP January 2024 with oropharyngeal dysphagia and cognitive deficits. MBS 09/13/22 showed continued aspiration of thin liquids and recommended a Dys 2 diet with nectar thick liquid. PMH includes anemia, anxiety, CAD, GERD, HLD, HTN, MI, PAD, paroxysmal A-fib, RA, Sjogren's syndrome, previous CVA (R ICA 08/2022) with significant residual L sided  weakness, tachy-brady syndrome, bilateral BKA   Assessment / Plan / Recommendation Clinical Impression  Pt demonstrates a profound dysarthria, but language and comprehension intact.  Family reports she was fully intelligible prior to admit and eating and drinking regular solids and thin liquids.  Observed through writing. Pt primarily using a white board to communicate simple wants and needs. Occasionally verbal cues are needed to redirect pt from unclear gestures and back to writing to clarify. She expresses trouble thinking and asks for others to decide things for her. However she seems to have relatively good awareness and functional problem solving skills. Pt has limited lingual movement and is about 10% intelligible at the word level. May have slightly better articulation when reclined. Attempted to assess pts use of lingraphica device, but not very interested. Preferred her white board. Recommend f/u at inpatient rehab to target speech, functional communication and swallowing.    SLP Assessment  SLP Recommendation/Assessment: Patient needs continued Speech Lanaguage Pathology Services SLP Visit Diagnosis: Dysphagia, oropharyngeal phase (R13.12)    Recommendations for follow up therapy are one component of a multi-disciplinary discharge planning process, led by the attending physician.  Recommendations may be updated based on patient status, additional functional criteria and insurance authorization.    Follow Up Recommendations  Acute inpatient rehab (3hours/day)    Assistance Recommended at Discharge     Functional Status Assessment    Frequency and Duration min 2x/week  2 weeks      SLP Evaluation Cognition  Overall Cognitive Status: Within Functional Limits for tasks assessed Attention: Alternating Awareness: Appears intact       Comprehension  Auditory Comprehension Overall Auditory Comprehension: Appears within functional limits for tasks assessed    Expression Verbal  Expression Overall Verbal Expression: Appears within functional limits for tasks assessed Written Expression Dominant Hand: Right   Oral / Motor  Oral Motor/Sensory Function Overall Oral Motor/Sensory Function: Severe impairment Facial ROM: Reduced left;Suspected CN VII (facial) dysfunction Facial Symmetry: Abnormal symmetry left;Suspected CN VII (facial) dysfunction Facial Strength: Reduced left;Suspected CN VII (facial) dysfunction Facial Sensation: Reduced left;Suspected CN V (Trigeminal) dysfunction Lingual ROM: Reduced left;Suspected CN XII (hypoglossal) dysfunction Lingual Symmetry: Abnormal symmetry left;Suspected CN XII (hypoglossal) dysfunction Lingual Strength: Suspected CN XII (hypoglossal) dysfunction;Reduced Lingual Sensation: Reduced;Suspected CN VII (facial) dysfunction-anterior 2/3 tongue Velum: Within Functional Limits Motor Speech Overall Motor Speech: Impaired Respiration: Impaired Level of Impairment: Word Phonation: Other (comment);Normal Resonance: Hypernasality Articulation: Impaired Level of Impairment: Word            Virgilio Broadhead, Riley Nearing 09/13/2023, 2:09 PM

## 2023-09-14 DIAGNOSIS — I63511 Cerebral infarction due to unspecified occlusion or stenosis of right middle cerebral artery: Secondary | ICD-10-CM | POA: Diagnosis not present

## 2023-09-14 MED ORDER — BUTALBITAL-APAP-CAFFEINE 50-325-40 MG PO TABS
1.0000 | ORAL_TABLET | ORAL | Status: DC | PRN
  Administered 2023-09-14 – 2023-09-16 (×2): 1 via ORAL
  Filled 2023-09-14 (×2): qty 1

## 2023-09-14 MED ORDER — FLUTICASONE PROPIONATE 50 MCG/ACT NA SUSP
2.0000 | Freq: Every day | NASAL | Status: DC
Start: 2023-09-14 — End: 2023-09-17
  Administered 2023-09-14 – 2023-09-17 (×4): 2 via NASAL
  Filled 2023-09-14: qty 16

## 2023-09-14 NOTE — TOC Initial Note (Signed)
Transition of Care [facility]) - Initial/Assessment Note    Patient Details  Name: Mercedes [name] MRN: [id number] Date of Birth: 03-19-1956  Transition of Care [facility]) CM/SW Contact:    Jaculin Rasmus Felipa Emory, Student-Social Work Phone Number: 09/14/2023, 11:24 AM  Clinical Narrative:   MSW Student and CSW met with patient. Patient indicated by writing on a white board that she needed help with bathing and eating. MSW Student and CSW updated RN about patient's requests. MSW Student and CSW asked patient who in her family to discuss needs with and she reported her sister.   MSW Student and CSW spoke with patient's sister [name] on the phone about patient needs. MSW Student and CSW discussed recommendation for SNF and sister in agreement. Patient was previously at a SNF in [place] but sister agreeable to look at other options. MSW Student completed referral and faxed out, awaiting bed offers.             Expected Discharge Plan: Skilled Nursing Facility Barriers to Discharge: Continued Medical Work up, English as a second language teacher   Patient Goals and CMS Choice Patient states their goals for this hospitalization and ongoing recovery are:: Wants to get a bath CMS Medicare.gov Compare Post Acute Care list provided to:: Patient Represenative (must comment) Choice offered to / list presented to : Sibling [place] ownership interest in [facility].provided to:: Sibling    Expected Discharge Plan and Services     Post Acute Care Choice: Skilled Nursing Facility Living arrangements for the past 2 months: Single Family Home                                      Prior Living Arrangements/Services Living arrangements for the past 2 months: Single Family Home Lives with:: Self Patient language and need for interpreter reviewed:: No Do you feel safe going back to the place where you live?: Yes      Need for Family Participation in Patient Care: Yes (Comment) Care giver support  system in place?: No (comment)   Criminal Activity/Legal Involvement Pertinent to Current Situation/Hospitalization: No - Comment as needed  Activities of Daily Living      Permission Sought/Granted Permission sought to share information with : Facility Medical sales representative, Family Supports Permission granted to share information with : Yes, Verbal Permission Granted  Share Information with NAME: [name]  Permission granted to share info w AGENCY: SNF  Permission granted to share info w Relationship: Sister     Emotional Assessment Appearance:: Appears stated age Attitude/Demeanor/Rapport: Engaged Affect (typically observed): Appropriate Orientation: : Oriented to Self, Oriented to Place, Oriented to  Time, Oriented to Situation Alcohol / Substance Use: Not Applicable Psych Involvement: No (comment)  Admission diagnosis:  Acute ischemic right MCA stroke (HCC) [I63.511] Acute CVA (cerebrovascular accident) [facility]) [I63.9] Patient Active Problem List   Diagnosis Date Noted   Acute ischemic right MCA stroke (HCC) 09/12/2023   Acute CVA (cerebrovascular accident) (HCC) 09/12/2023   Ulcer of right groin, limited to breakdown of skin (HCC) 07/31/2023   Sjogren's disease (HCC) 09/18/2022   Dysphagia as late effect of cerebrovascular accident (CVA) 09/18/2022   Protein-calorie malnutrition, severe 08/30/2022   History of stroke 12/14/2021   Ulceration of stump of above knee amputation (HCC) 11/09/2021   S/P AKA (above knee amputation), right (HCC), 11/23/20 12/14/2020   S/P AKA (above knee amputation), left (HCC), 10/01/19 10/20/2019   Femoral-popliteal bypass graft occlusion,  left (HCC) 09/25/2019   Renal infarct (HCC) 02/01/2019   Diabetes (HCC) 04/01/2017   Hyperthyroidism 02/21/2017   Hx of CABG 07/17/2016   Paroxysmal atrial fibrillation (HCC) 07/12/2016   Phantom limb pain-Left great toe 04/29/2015   Seizure (HCC)    Atherosclerosis of native artery of left lower extremity  with rest pain (HCC) 12/18/2013   PAD (peripheral artery disease) (HCC) 12/02/2013   SLE (systemic lupus erythematosus) (HCC)    CAD - S/P Takotsubo MI in 2000, CFX DES Sept 2014 05/25/2013   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   Tachycardia-bradycardia (HCC) 03/09/2010   PVD- s/p Lt [facility] April 2015 10/29/2009   Dyslipidemia 08/26/2009   Essential hypertension 08/26/2009   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   Rheumatoid arthritis (HCC) 08/26/2009   PCP:  Pincus Sanes, MD Pharmacy:   [facility] [phone] - [place], [place] - [address] AT [facility] OF [address] [place] [address] Phone: [phone] Fax: [phone]  [facility][id number] [name], [place] - [address] AT [facility] OF [address] & [facility] [address] [place] [address] Phone: [phone] Fax: [phone]     Social Drivers of Health (SDOH) Social History: SDOH Screenings   Food Insecurity: Patient Declined (09/13/2023)  Housing: Patient Declined (09/13/2023)  Transportation Needs: No Transportation Needs (09/13/2023)  Utilities: Patient Declined (09/13/2023)  Alcohol Screen: Low Risk  (07/15/2021)  Depression (PHQ2-9): Low Risk  (11/08/2021)  Financial Resource Strain: Low Risk  (07/15/2021)  Physical Activity: Inactive (07/15/2021)  Social Connections: Patient Declined (09/13/2023)  Stress: No Stress Concern Present (07/15/2021)  Tobacco Use: Medium Risk (09/12/2023)   SDOH Interventions:     Readmission Risk Interventions    03/17/2022   10:38 AM  Readmission Risk Prevention Plan  Post Dischage Appt Complete  Medication Screening Complete  Transportation Screening Complete

## 2023-09-14 NOTE — Plan of Care (Signed)

## 2023-09-14 NOTE — Plan of Care (Signed)
No acute changes. Awaiting SNF. Calorie count   Problem: Ischemic Stroke/TIA Tissue Perfusion: Goal: Complications of ischemic stroke/TIA will be minimized Outcome: Not Met (add Reason)   Problem: Nutrition: Goal: Risk of aspiration will decrease Outcome: Not Met (add Reason) Goal: Dietary intake will improve Outcome: Not Met (add Reason)   Problem: Clinical Measurements: Goal: Will remain free from infection Outcome: Not Met (add Reason)   Problem: Activity: Goal: Risk for activity intolerance will decrease Outcome: Not Met (add Reason)   Problem: Nutrition: Goal: Adequate nutrition will be maintained Outcome: Not Met (add Reason)   Problem: Pain Managment: Goal: General experience of comfort will improve and/or be controlled Outcome: Not Met (add Reason)   Problem: Safety: Goal: Ability to remain free from injury will improve Outcome: Not Met (add Reason)   Problem: Skin Integrity: Goal: Risk for impaired skin integrity will decrease Outcome: Not Met (add Reason)

## 2023-09-14 NOTE — Progress Notes (Signed)
PROGRESS NOTE    Mercedes [name]  ZOX:[id number] DOB: 1955-12-19 DOA: 09/12/2023 PCP: Pincus Sanes, MD    Brief Narrative:  68 year old female with extensive medical history including history of stroke, hypertension, hyperlipidemia, coronary artery disease status post CABG, history of Takotsubo cardiomyopathy, peripheral artery disease status post bypass and now with bilateral above-knee amputation, atrial fibrillation, tachybradycardia syndrome status post pacemaker, type 2 diabetes, Sjogren's syndrome, seizure disorder, hypothyroidism and GERD presented from home with focal neurological deficits.  Patient lives at home and reported falling out of chair 1 day prior to admit.  In the morning, she spoke with her granddaughter who noted unintelligible speech, granddaughter came home to see her and found with profound weakness and left-sided facial droop and dysarthria so EMS was called.  Patient was brought to the emergency room as code stroke.  Thrombectomy was suggested for right M2, after multiple discussions this was not carried out.  Admitted for supportive care and further stroke workup.  Subjective:  Patient seen and examined.  Still complains of sinus congestion and headache mostly on the right side.  Denies any previous history of headache.  Will try some Flonase and Fioricet.  Does not like to eat dysphagia diet.  Will need calorie count.  Assessment & Plan:   Acute right MCA territory stroke: Clinical findings, left-sided deficits, facial droop, arm weakness and numbness.  Severe dysarthria. CTA head and neck, occlusion of right M2 and A2 with infarct. MRI of the brain, unable to do because of incompatible MRI.  Repeat CT head without any acute findings. 2D echocardiogram, normal ejection fraction.  Regional wall motion abnormality which is likely chronic.  No intracardiac thrombus. Antiplatelet therapy, on Eliquis at home.  Continued. LDL 83, started on Crestor 40 mg  daily. Hemoglobin A1c, 6.  No indication for treatment. Therapy recommendations , a skilled nursing facility placement. Patient was not a tPA candidate because of unknown duration, thrombectomy was suggested however declined. Significant dysarthria, seen by speech therapy, started on dysphagia 1 diet with honey thick liquid.  Will start medications by mouth.  Profound dysarthria and dysphagia: Started on dysphagia 1 diet.  Not meeting adequate oral intake.  Start calorie count to ensure adequate intake before discharging.  Essential hypertension blood pressures stable.  Holding antihypertensives.  Will resume slowly. Hyperlipidemia: As above.  On Crestor. Coronary artery disease status post CABG: On statin, currently on Eliquis. History of A-fib: Sinus rhythm.  On Eliquis.  Has pacemaker due to tachybradycardia syndrome. Peripheral artery disease: Status post bypass, subsequent bilateral BKA.  On Eliquis and rosuvastatin.     DVT prophylaxis: SCD's Start: 09/12/23 1433 apixaban (ELIQUIS) tablet 5 mg   Code Status: Full code Family Communication: None today. Disposition Plan: Status is: Inpatient Remains inpatient appropriate because: Stroke workup, oral intake evaluation.     Consultants:  Neurology Palliative care, will consult  Procedures:  None  Antimicrobials:  None     Objective: Vitals:   09/13/23 2027 09/14/23 0006 09/14/23 0335 09/14/23 0736  BP: (!) 180/166 (!) 151/87 (!) 140/80 (!) 155/75  Pulse: 94 63 64 (!) 48  Resp: 16 16 16 18   Temp: 98.9 F (37.2 C) 98.6 F (37 C) 97.7 F (36.5 C) 98.8 F (37.1 C)  TempSrc: Oral Oral Oral Oral  SpO2: 92% 98% 100% 99%  Weight:      Height:        Intake/Output Summary (Last 24 hours) at 09/14/2023 1133 Last data filed at 09/13/2023 2000 Gross per  24 hour  Intake 507 ml  Output --  Net 507 ml   Filed Weights   09/12/23 1213  Weight: 41 kg    Examination:  General: Chronically sick looking.  Slightly  anxious due to headache.  Profoundly dysarthric and writing on her white board. Cardiovascular: S1-S2 normal.  Regular rate rhythm. Respiratory: Bilateral clear.  No added sounds. Gastrointestinal: Soft.  Nontender.  Bowel sound present. Ext: No swelling or edema. Neuro: Alert awake and mostly oriented.  Profoundly dysarthric so difficult to interpret but she is able to write with right hand without difficulties. She can move right upper extremity and amputated right lower extremity with normal power. Left upper extremity 0/5, left lower extremity that has a bony amputation stump she can move at the hip joint with 3/5.   Data Reviewed: I have personally reviewed following labs and imaging studies  CBC: Recent Labs  Lab 09/12/23 1225 09/12/23 1232  WBC 4.4  --   NEUTROABS 2.4  --   HGB 12.5 13.9  HCT 40.7 41.0  MCV 77.7*  --   PLT 335  --    Basic Metabolic Panel: Recent Labs  Lab 09/12/23 1225 09/12/23 1232  NA 138 139  K 3.9 3.9  CL 107 108  CO2 23  --   GLUCOSE 99 97  BUN 9 10  CREATININE 0.89 0.90  CALCIUM 9.0  --    GFR: Estimated Creatinine Clearance: 38.2 mL/min (by C-G formula based on SCr of 0.9 mg/dL). Liver Function Tests: Recent Labs  Lab 09/12/23 1225  AST 18  ALT 7  ALKPHOS 71  BILITOT 0.7  PROT 7.6  ALBUMIN 2.8*   No results for input(s): "LIPASE", "AMYLASE" in the last 168 hours. No results for input(s): "AMMONIA" in the last 168 hours. Coagulation Profile: Recent Labs  Lab 09/12/23 1225  INR 1.1   Cardiac Enzymes: No results for input(s): "CKTOTAL", "CKMB", "CKMBINDEX", "TROPONINI" in the last 168 hours. BNP (last 3 results) No results for input(s): "PROBNP" in the last 8760 hours. HbA1C: Recent Labs    09/12/23 1225  HGBA1C 6.0*   CBG: Recent Labs  Lab 09/12/23 1215 09/12/23 1223  GLUCAP 98 91   Lipid Profile: Recent Labs    09/13/23 0611  CHOL 141  HDL 40*  LDLCALC 83  TRIG 90  CHOLHDL 3.5   Thyroid Function  Tests: No results for input(s): "TSH", "T4TOTAL", "FREET4", "T3FREE", "THYROIDAB" in the last 72 hours. Anemia Panel: No results for input(s): "VITAMINB12", "FOLATE", "FERRITIN", "TIBC", "IRON", "RETICCTPCT" in the last 72 hours. Sepsis Labs: No results for input(s): "PROCALCITON", "LATICACIDVEN" in the last 168 hours.  Recent Results (from the past 240 hours)  Resp panel by RT-PCR (RSV, Flu A&B, Covid) Anterior Nasal Swab     Status: None   Collection Time: 09/12/23  3:18 PM   Specimen: Anterior Nasal Swab  Result Value Ref Range Status   SARS Coronavirus 2 by RT PCR NEGATIVE NEGATIVE Final   Influenza A by PCR NEGATIVE NEGATIVE Final   Influenza B by PCR NEGATIVE NEGATIVE Final    Comment: (NOTE) The Xpert Xpress SARS-CoV-2/FLU/RSV plus assay is intended as an aid in the diagnosis of influenza from Nasopharyngeal swab specimens and should not be used as a sole basis for treatment. Nasal washings and aspirates are unacceptable for Xpert Xpress SARS-CoV-2/FLU/RSV testing.  Fact Sheet for Patients: BloggerCourse.com  Fact Sheet for Healthcare Providers: SeriousBroker.it  This test is not yet approved or cleared by the [place]  FDA and has been authorized for detection and/or diagnosis of SARS-CoV-2 by FDA under an Emergency Use Authorization (EUA). This EUA will remain in effect (meaning this test can be used) for the duration of the COVID-19 declaration under Section 564(b)(1) of the Act, 21 U.S.C. section 360bbb-3(b)(1), unless the authorization is terminated or revoked.     Resp Syncytial Virus by PCR NEGATIVE NEGATIVE Final    Comment: (NOTE) Fact Sheet for Patients: BloggerCourse.com  Fact Sheet for Healthcare Providers: SeriousBroker.it  This test is not yet approved or cleared by the [place] FDA and has been authorized for detection and/or diagnosis of  SARS-CoV-2 by FDA under an Emergency Use Authorization (EUA). This EUA will remain in effect (meaning this test can be used) for the duration of the COVID-19 declaration under Section 564(b)(1) of the Act, 21 U.S.C. section 360bbb-3(b)(1), unless the authorization is terminated or revoked.  Performed at [facility], [address], [place], [place] [address]          Radiology Studies: CT HEAD WO CONTRAST ( ) Result Date: 09/13/2023 CLINICAL DATA:  Stroke, follow up EXAM: CT HEAD WITHOUT CONTRAST TECHNIQUE: Contiguous axial images were obtained from the base of the skull through the vertex without intravenous contrast. RADIATION DOSE REDUCTION: This exam was performed according to the departmental dose-optimization program which includes automated exposure control, adjustment of the mA and/or kV according to patient size and/or use of iterative reconstruction technique. COMPARISON:  CT head 09/12/2023. FINDINGS: Brain: Motion limited study with new areas of hypoattenuation and loss of gray differentiation in the posterior right MCA territory including the parietal and temporal lobes, compatible with for acute infarct. No evidence of mass occupying acute hemorrhage, mass lesion or hydrocephalus. Vascular: Not well evaluated due to motion. Skull: No acute fracture. Sinuses/Orbits: Clear sinuses.  No acute findings. Other: No mastoid effusions. IMPRESSION: Motion limited study with new areas of acute infarct in the posterior right MCA territory. No significant mass effect or midline shift. Electronically Signed   By: Feliberto Harts M.D.   On: 09/13/2023 20:58   ECHOCARDIOGRAM COMPLETE Result Date: 09/13/2023    ECHOCARDIOGRAM REPORT   Patient Name:   NIKITA ZIOMEK Date of Exam: 09/13/2023 Medical Rec #:  [id number]         Height:       65.0 in Accession #:    [id number]        Weight:       90.4 lb Date of Birth:  [birth year]         BSA:          1.411 m Patient Age:    67 years           BP:           171/99 mmHg Patient Gender: F                 HR:           60 bpm. Exam Location:  Inpatient Procedure: 2D Echo, Cardiac Doppler and Color Doppler Indications:    Stroke  History:        Patient has prior history of Echocardiogram examinations, most                 recent 08/29/2022. Risk Factors:Hypertension, Diabetes,                 Dyslipidemia and Former Smoker.  Sonographer:    Karma Ganja Referring Phys: [id number] Cecille Po MELVIN  Sonographer Comments: Technically challenging study due to limited acoustic windows. IMPRESSIONS  1. Left ventricular ejection fraction, by estimation, is 55 to 60%. The left ventricle has normal function. The left ventricle demonstrates regional wall motion abnormalities (see scoring diagram/findings for description). There is moderate asymmetric left ventricular hypertrophy of the basal-septal segment. Left ventricular diastolic parameters are consistent with Grade I diastolic dysfunction (impaired relaxation).  2. Right ventricular systolic function is normal. The right ventricular size is normal.  3. The mitral valve is normal in structure. Trivial mitral valve regurgitation. No evidence of mitral stenosis.  4. The aortic valve is normal in structure. There is mild calcification of the aortic valve. Aortic valve regurgitation is not visualized. Aortic valve sclerosis/calcification is present, without any evidence of aortic stenosis.  5. The inferior vena cava is normal in size with greater than 50% respiratory variability, suggesting right atrial pressure of 3 mmHg. Conclusion(s)/Recommendation(s): Overall EF appears normal. Likely subtle inferoapical hypokinesis. FINDINGS  Left Ventricle: Left ventricular ejection fraction, by estimation, is 55 to 60%. The left ventricle has normal function. The left ventricle demonstrates regional wall motion abnormalities. The left ventricular internal cavity size was normal in size. There is moderate asymmetric left  ventricular hypertrophy of the basal-septal segment. Left ventricular diastolic parameters are consistent with Grade I diastolic dysfunction (impaired relaxation).  LV Wall Scoring: The apical inferior segment is hypokinetic. Right Ventricle: The right ventricular size is normal. No increase in right ventricular wall thickness. Right ventricular systolic function is normal. Left Atrium: Left atrial size was normal in size. Right Atrium: Right atrial size was normal in size. Pericardium: There is no evidence of pericardial effusion. Mitral Valve: The mitral valve is normal in structure. Trivial mitral valve regurgitation. No evidence of mitral valve stenosis. MV peak gradient, 2.5 mmHg. The mean mitral valve gradient is 1.0 mmHg. Tricuspid Valve: The tricuspid valve is normal in structure. Tricuspid valve regurgitation is not demonstrated. No evidence of tricuspid stenosis. Aortic Valve: The aortic valve is normal in structure. There is mild calcification of the aortic valve. Aortic valve regurgitation is not visualized. Aortic valve sclerosis/calcification is present, without any evidence of aortic stenosis. Aortic valve mean gradient measures 2.0 mmHg. Aortic valve peak gradient measures 3.7 mmHg. Aortic valve area, by VTI measures 2.77 cm. Pulmonic Valve: The pulmonic valve was normal in structure. Pulmonic valve regurgitation is not visualized. No evidence of pulmonic stenosis. Aorta: The aortic root is normal in size and structure. Venous: The inferior vena cava is normal in size with greater than 50% respiratory variability, suggesting right atrial pressure of 3 mmHg. IAS/Shunts: No atrial level shunt detected by color flow Doppler.  LEFT VENTRICLE PLAX 2D LVIDd:         4.30 cm   Diastology LVIDs:         3.30 cm   LV e' medial:    5.22 cm/s LV PW:         1.10 cm   LV E/e' medial:  9.3 LV IVS:        1.70 cm   LV e' lateral:   6.20 cm/s LVOT diam:     2.00 cm   LV E/e' lateral: 7.8 LV SV:         49 LV SV  Index:   35 LVOT Area:     3.14 cm  RIGHT VENTRICLE            IVC RV Basal diam:  3.00 cm    IVC diam: 1.20  cm RV S prime:     8.49 cm/s TAPSE (M-mode): 1.2 cm LEFT ATRIUM             Index        RIGHT ATRIUM           Index LA diam:        3.90 cm 2.76 cm/m   RA Area:     12.60 cm LA Vol (A2C):   48.8 ml 34.57 ml/m  RA Volume:   31.70 ml  22.46 ml/m LA Vol (A4C):   18.1 ml 12.82 ml/m LA Biplane Vol: 29.5 ml 20.90 ml/m  AORTIC VALVE AV Area (Vmax):    2.84 cm AV Area (Vmean):   2.52 cm AV Area (VTI):     2.77 cm AV Vmax:           96.10 cm/s AV Vmean:          58.500 cm/s AV VTI:            0.177 m AV Peak Grad:      3.7 mmHg AV Mean Grad:      2.0 mmHg LVOT Vmax:         86.80 cm/s LVOT Vmean:        47.000 cm/s LVOT VTI:          0.156 m LVOT/AV VTI ratio: 0.88  AORTA Ao Root diam: 3.40 cm MITRAL VALVE MV Area (PHT): 3.60 cm    SHUNTS MV Area VTI:   2.31 cm    Systemic VTI:  0.16 m MV Peak grad:  2.5 mmHg    Systemic Diam: 2.00 cm MV Mean grad:  1.0 mmHg MV Vmax:       0.79 m/s MV Vmean:      49.0 cm/s MV Decel Time: 211 msec MV E velocity: 48.40 cm/s MV A velocity: 60.00 cm/s MV E/A ratio:  0.81 Arvilla Meres MD Electronically signed by Arvilla Meres MD Signature Date/Time: 09/13/2023/11:06:01 AM    Final    DG Swallowing Func-Speech Pathology Result Date: 09/13/2023 Table formatting from the original result was not included. Modified Barium Swallow Study Patient Details Name: SAMEEN STAIR MRN: [id number] Date of Birth: [birth year] Today's Date: 09/13/2023 HPI/PMH: HPI: LANDI NEUVILLE is a 68 yo female presenting to ED 1/15 with slurred speech, L facial droop, and L sided weakness/numbness. CTA shows R MCA and ACA occlusions and infarct core in R posterior frontal periventricular white matter with penumbra throughout the posterior R MCA. Was recommended to undergo emergent thrombectomy but pt and her family refused. Previously seen by SLP January 2024 with oropharyngeal dysphagia and  cognitive deficits. MBS 09/13/22 showed continued aspiration of thin liquids and recommended a Dys 2 diet with nectar thick liquid. PMH includes anemia, anxiety, CAD, GERD, HLD, HTN, MI, PAD, paroxysmal A-fib, RA, Sjogren's syndrome, previous CVA (R ICA 08/2022) with significant residual L sided weakness, tachy-brady syndrome, bilateral BKA Clinical Impression: Clinical Impression: Imaging was not recorded so this study is documented based on SLPs initial interpretation in real time. Pt demonstrates a primary oral dysphagia with significant left CN VII and CN XII weakness. Pt unable to achieve volitional lingual movement, but uses secondary movement, rocking, posterior tilt and face wiping to elicit sensation and subsequent movement. Her impairments are again severe, but seem to be similar in nature to prior admissions and strokes, so she is quickly using strategies that have worked in the past. Pt has anterior loss and oral residue with liquids  and thin and nectar thick liquids are aspirated before/during the swallow due to slightly late airway closure. Controlling bolus size with teaspoon is again most effective. There is no pharyngeal residue. When given honey and puree teaspoon pt has a more cohesive oral phase and there is no penetraiton or aspiration. Pt seems to improve as study progressess. Will benefit from initaiting a conservative diet for practice puree/honey but has good potential to upgrade as she has done in the past. She is unhappy about purees and wants more texture in the future, but during the study, increased texture significantly delayed oral transit and increased oral residue. Factors that may increase risk of adverse event in presence of aspiration Rubye Oaks & Clearance Coots 2021): No data recorded Recommendations/Plan: Swallowing Evaluation Recommendations Swallowing Evaluation Recommendations Recommendations: PO diet PO Diet Recommendation: Dysphagia 1 (Pureed); Moderately thick liquids (Level 3, honey  thick) Liquid Administration via: Spoon Medication Administration: Crushed with puree Supervision: Staff to assist with self-feeding Swallowing strategies  : Slow rate; Small bites/sips; Check for pocketing or oral holding; Check for anterior loss Postural changes: Position pt fully upright for meals; Stay upright 30-60 min after meals Oral care recommendations: Oral care before PO Caregiver Recommendations: Have oral suction available Treatment Plan Treatment Plan Treatment recommendations: Therapy as outlined in treatment plan below Follow-up recommendations: Home health SLP Functional status assessment: Patient has had a recent decline in their functional status and demonstrates the ability to make significant improvements in function in a reasonable and predictable amount of time. Treatment frequency: Min 2x/week Treatment duration: 2 weeks Interventions: Patient/family education; Aspiration precaution training; Trials of upgraded texture/liquids; Diet toleration management by SLP Recommendations Recommendations for follow up therapy are one component of a multi-disciplinary discharge planning process, led by the attending physician.  Recommendations may be updated based on patient status, additional functional criteria and insurance authorization. Assessment: Orofacial Exam: Orofacial Exam Oral Cavity: Oral Hygiene: Pooled secretions Oral Cavity - Dentition: Adequate natural dentition Orofacial Anatomy: WFL Oral Motor/Sensory Function: Suspected cranial nerve impairment CN V - Trigeminal: WFL CN VII - Facial: Left motor impairment CN XII - Hypoglossal: Left motor impairment; Right motor impairment Anatomy: Anatomy: WFL Boluses Administered: Boluses Administered Boluses Administered: Thin liquids (Level 0); Mildly thick liquids (Level 2, nectar thick); Moderately thick liquids (Level 3, honey thick); Puree  Oral Impairment Domain: Oral Impairment Domain Lip Closure: Escape beyond mid-chin Tongue control during  bolus hold: Cohesive bolus between tongue to palatal seal Bolus preparation/mastication: Minimal chewing/mashing with majority of bolus unchewed Bolus transport/lingual motion: Minimal-no tongue motion Oral residue: Residue collection on oral structures Location of oral residue : Tongue; Palate Initiation of pharyngeal swallow : Pyriform sinuses  Pharyngeal Impairment Domain: Pharyngeal Impairment Domain Soft palate elevation: No bolus between soft palate (SP)/pharyngeal wall (PW) Laryngeal elevation: Complete superior movement of thyroid cartilage with complete approximation of arytenoids to epiglottic petiole Anterior hyoid excursion: Complete anterior movement Epiglottic movement: Complete inversion Laryngeal vestibule closure: Complete, no air/contrast in laryngeal vestibule Pharyngeal stripping wave : Present - complete Pharyngoesophageal segment opening: Complete distension and complete duration, no obstruction of flow Tongue base retraction: No contrast between tongue base and posterior pharyngeal wall (PPW) Pharyngeal residue: Complete pharyngeal clearance  Esophageal Impairment Domain: No data recorded Pill: No data recorded Penetration/Aspiration Scale Score: Penetration/Aspiration Scale Score 1.  Material does not enter airway: Thin liquids (Level 0); Mildly thick liquids (Level 2, nectar thick); Moderately thick liquids (Level 3, honey thick); Puree 4.  Material enters airway, CONTACTS cords then ejected out: Mildly  thick liquids (Level 2, nectar thick); Thin liquids (Level 0) 8.  Material enters airway, passes BELOW cords without attempt by patient to eject out (silent aspiration) : Thin liquids (Level 0); Mildly thick liquids (Level 2, nectar thick) Compensatory Strategies: Compensatory Strategies Compensatory strategies: No   General Information: Caregiver present: No  Diet Prior to this Study: NPO   Temperature : Normal   Respiratory Status: WFL   Supplemental O2: None (Room air)   History of Recent  Intubation: No  Behavior/Cognition: Alert; Cooperative Self-Feeding Abilities: Able to self-feed Baseline vocal quality/speech: Other (comment) (wet) Volitional Cough: Able to elicit Volitional Swallow: Able to elicit Exam Limitations: Excessive movement Goal Planning: Prognosis for improved oropharyngeal function: Good No data recorded No data recorded Patient/Family Stated Goal: coffee Consulted and agree with results and recommendations: Patient Pain: Pain Assessment Pain Assessment: No/denies pain End of Session: Start Time:SLP Start Time (ACUTE ONLY): 0935 Stop Time: SLP Stop Time (ACUTE ONLY): 0956 Time Calculation:SLP Time Calculation (min) (ACUTE ONLY): 21 min Charges: SLP Evaluations $ SLP Speech Visit: 1 Visit SLP Evaluations $BSS Swallow: 1 Procedure $MBS Swallow: 1 Procedure $Swallowing Treatment: 1 Procedure SLP visit diagnosis: SLP Visit Diagnosis: Dysphagia, oral phase (R13.11) Past Medical History: Past Medical History: Diagnosis Date  Abnormal LFTs   a. in the past when on Lipitor  Acute ischemic right ICA stroke (HCC) 08/28/2022  Anemia   as a young woman  Anxiety   Arnold-Chiari malformation, type I (HCC)   MRI brain 02/2010  Atrial flutter with rapid ventricular response vs. SVT 10/27/2014  CAD (coronary artery disease)   a. NSTEMI 07/2009: LHC - D2 40%, LAD irreg., EF 50% with apical AK (tako-tsubo CM);  b. Inf STEMI (04/2013): Tx Promus DES to CFX;  c. 08/2013 Lexi CL: No ischemia, dist ant/ap/inf-ap infarct, EF  d. 06/2016 NSTEMI with LM disease: s/p CABG on 07/17/2016 w/ LIMA-LAD, SVG-OM, and SVG-dRCA.  DEPRESSION   Dizziness   ? CVA 01/2010 - carotid dopplers with no ICA stenosis  GERD (gastroesophageal reflux disease)   Headache   History of transmetatarsal amputation of left foot (HCC) 07/17/2019  HYPERLIPIDEMIA   HYPERTENSION   Ischemia of extremity 10/29/2020  MI (myocardial infarction) (HCC)   PAD (peripheral artery disease) (HCC)   s/p L fem pop 11/2013 in setting of 1st toe osteo/gangrene   Paroxysmal atrial fibrillation (HCC)   a. anticoagulated with Eliquis 10/2014  Presence of permanent cardiac pacemaker 10/2014  leadless permanent pacemaker  RA (rheumatoid arthritis) (HCC)   prior tx by Dr. Dareen Piano  Sjogren's syndrome (HCC) 06/02/2013  pt denies this (12/01/13)  SLE (systemic lupus erythematosus) (HCC) dx 05/2013  follows with rheum Dareen Piano)  Stroke [facility])   March 2023, weakness in right arm is improving  Tachy-brady syndrome (HCC)   a. 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  f/u echo 09/2009: EF 50-55%, mild LVH, mod diast dysfxn, mild apical HK  Wears dentures   Wears glasses  Past Surgical History: Past Surgical History: Procedure Laterality Date  ABDOMINAL AORTAGRAM N/A 11/24/2013  Procedure: ABDOMINAL AORTAGRAM WITH BILATERAL RUNOFF WITH POSSIBLE INTERVENTION;  Surgeon: Chuck Hint, MD;  [facility] [facility];  Service: Cardiovascular;  Laterality: N/A;  ABDOMINAL AORTOGRAM W/LOWER EXTREMITY N/A 04/25/2019  Procedure: ABDOMINAL AORTOGRAM W/LOWER EXTREMITY;  Surgeon: Sherren Kerns, MD;  [facility] IN[facility] LAB;  Service: Cardiovascular;  Laterality: N/A;  ABDOMINAL AORTOGRAM W/LOWER EXTREMITY Bilateral 05/15/2019  Procedure: ABDOMINAL AORTOGRAM W/LOWER EXTREMITY;  Surgeon: Coral Else  W, MD;  [facility] LAB;  Service: Cardiovascular;  Laterality: Bilateral;  ABDOMINAL AORTOGRAM W/LOWER EXTREMITY N/A 08/23/2020  Procedure: ABDOMINAL AORTOGRAM W/LOWER EXTREMITY;  Surgeon: Maeola Harman, MD;  Location: [facility] INVASIVE CV LAB;  Service: Cardiovascular;  Laterality: N/A;  ABDOMINAL AORTOGRAM W/LOWER EXTREMITY N/A 10/29/2020  Procedure: ABDOMINAL AORTOGRAM W/LOWER EXTREMITY;  Surgeon: Sherren Kerns, MD;  [facility] LAB;  Service: Cardiovascular;  Laterality: N/A;  AMPUTATION Left 12/02/2013  Procedure: LEFT 1ST TOE AMPUTATION;  Surgeon: Sherren Kerns, MD;  Location: [facility] OR;  Service: Vascular;  Laterality:  Left;  AMPUTATION Left 07/16/2019  Procedure: LEFT TRANSMETATARSAL AMPUTATION;  Surgeon: Nadara Mustard, MD;  Location: [facility] OR;  Service: Orthopedics;  Laterality: Left;  AMPUTATION Left 10/01/2019  Procedure: LEFT AMPUTATION ABOVE KNEE;  Surgeon: Sherren Kerns, MD;  Location: [facility] OR;  Service: Vascular;  Laterality: Left;  AMPUTATION Right 08/31/2020  Procedure: Right first toe amputation;  Surgeon: Sherren Kerns, MD;  Location: [facility] OR;  Service: Vascular;  Laterality: Right;  AMPUTATION Right 11/23/2020  Procedure: RIGHT AMPUTATION ABOVE KNEE;  Surgeon: Sherren Kerns, MD;  Location: [facility] OR;  Service: Vascular;  Laterality: Right;  AMPUTATION Left 03/14/2022  Procedure: REVISION LEFT ABOVE KNEE AMPUTATION;  Surgeon: Maeola Harman, MD;  Location: [facility] OR;  Service: Vascular;  Laterality: Left;  APPLICATION OF WOUND VAC Left 11/03/2019  Procedure: Application Of Wound Vac, left lower extremity;  Surgeon: Sherren Kerns, MD;  Location: [facility] OR;  Service: Vascular;  Laterality: Left;  CARDIAC CATHETERIZATION N/A 07/12/2016  Procedure: Left Heart Cath and Coronary Angiography;  Surgeon: Lennette Bihari, MD;  [facility];  Service: Cardiovascular;  Laterality: N/A;  CARDIAC CATHETERIZATION N/A 07/14/2016  Procedure: Intravascular Ultrasound/IVUS;  Surgeon: Yvonne Kendall, MD;  [facility];  Service: Cardiovascular;  Laterality: N/A;  COLONOSCOPY    CORONARY ANGIOPLASTY  04/2013  CORONARY ARTERY BYPASS GRAFT N/A 07/17/2016  Procedure: CORONARY ARTERY BYPASS GRAFTING (CABG)x3 using right greater saphenous vein harvested endoscopiclly and left internal mammary artery;  Surgeon: Kerin Perna, MD;  Location: [facility] OR;  Service: Open Heart Surgery;  Laterality: N/A;  ENDARTERECTOMY FEMORAL Left 05/12/2019  Procedure: LEFT Femoral Endarterectomy;  Surgeon: Sherren Kerns, MD;  Location: [facility] OR;  Service: Vascular;  Laterality: Left;  FEMORAL-POPLITEAL BYPASS GRAFT Left 12/02/2013   Procedure:  LEFT FEMORAL-BELOW KNEE POPLITEAL ARTERY BYPASS GRAFT;  Surgeon: Sherren Kerns, MD;  Location: [facility] OR;  Service: Vascular;  Laterality: Left;  FEMORAL-POPLITEAL BYPASS GRAFT Left 09/25/2019  Procedure: THROMBECTOMY and PROPATEN BYPASS  FEMORAL TO BELOW KNEE POPLITEAL ARTERY BYPASS GRAFT LEFT LEG;  Surgeon: Larina Earthly, MD;  [facility] OR;  Service: Vascular;  Laterality: Left;  FEMORAL-POPLITEAL BYPASS GRAFT Right 08/24/2020  Procedure: RIGHT FEMORAL-BELOW KNEE POPLITEAL ARTERY BYPASS USING GORE PROPATEN GRAFT;  Surgeon: Maeola Harman, MD;  Location: [facility] OR;  Service: Vascular;  Laterality: Right;  FEMORAL-POPLITEAL BYPASS GRAFT Right 11/01/2020  Procedure: Thrombectomy and Revision of Right Femoral to below knee Bypass with Interpostional graft to Tibial / peroneal Trunk and Endarterectomy Tibial Weston Settle Trunk.;  Surgeon: Sherren Kerns, MD;  Location: [facility] OR;  Service: Vascular;  Laterality: Right;  INTRAOPERATIVE ARTERIOGRAM Right 11/01/2020  Procedure: ANTEGRADE RIGHT LOWER EXTREMITY INTRA OPERATIVE ARTERIOGRAM;  Surgeon: Sherren Kerns, MD;  Location: [facility] OR;  Service: Vascular;  Laterality: Right;  IR CT HEAD LTD  08/28/2022  IR PERCUTANEOUS ART THROMBECTOMY/INFUSION INTRACRANIAL INC DIAG ANGIO  08/28/2022  IR US GUIDE VASC ACCESS RIGHT  08/28/2022  LEFT HEART CATHETERIZATION WITH CORONARY ANGIOGRAM N/A 05/19/2013  Procedure: LEFT HEART CATHETERIZATION WITH CORONARY ANGIOGRAM;  Surgeon: Micheline Chapman, MD;  Location: [facility] CATH LAB;  Service: Cardiovascular;  Laterality: N/A;  LEFT HEART CATHETERIZATION WITH CORONARY ANGIOGRAM N/A 10/28/2014  Procedure: LEFT HEART CATHETERIZATION WITH CORONARY ANGIOGRAM;  Surgeon: Iran Ouch, MD;  [facility] CATH [facility];  Service: Cardiovascular;  Laterality: N/A;  LOWER EXTREMITY ANGIOGRAM Left 05/12/2019  Procedure: Lower Extremity Angiogram;  Surgeon: Sherren Kerns, MD;  Location: [facility] OR;  Service: Vascular;  Laterality: Left;  PATCH ANGIOPLASTY  Left 05/12/2019  Procedure: Left Femoral Patch Angioplasty USING CADAVERIC SAPHENOUS VEIN;  Surgeon: Sherren Kerns, MD;  Location: [facility] OR;  Service: Vascular;  Laterality: Left;  PERCUTANEOUS CORONARY STENT INTERVENTION (PCI-S)  05/19/2013  Procedure: PERCUTANEOUS CORONARY STENT INTERVENTION (PCI-S);  Surgeon: Micheline Chapman, MD;  Location: [facility] CATH LAB;  Service: Cardiovascular;;  PERIPHERAL VASCULAR INTERVENTION Right 10/29/2020  Procedure: PERIPHERAL VASCULAR INTERVENTION;  Surgeon: Sherren Kerns, MD;  Location: [facility] INVASIVE CV LAB;  Service: Cardiovascular;  Laterality: Right;  common iliac  PERMANENT PACEMAKER INSERTION N/A 11/04/2014  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  RADIOLOGY WITH ANESTHESIA N/A 08/28/2022  Procedure: RADIOLOGY WITH ANESTHESIA;  Surgeon: Radiologist, Medication, MD;  [facility] OR;  Service: Radiology;  Laterality: N/A;  RADIOLOGY WITH ANESTHESIA N/A 09/12/2023  Procedure: RADIOLOGY WITH ANESTHESIA;  Surgeon: Radiologist, Medication, MD;  [facility] OR;  Service: Radiology;  Laterality: N/A;  TEE WITHOUT CARDIOVERSION N/A 07/17/2016  Procedure: TRANSESOPHAGEAL ECHOCARDIOGRAM (TEE);  Surgeon: Kerin Perna, MD;  Location: [facility] OR;  Service: Open Heart Surgery;  Laterality: N/A;  THROMBECTOMY FEMORAL ARTERY Left 05/12/2019  Procedure: Left Ilio-Femoral Artery and Femoral-popliteal Graft Thrombectomy;  Surgeon: Sherren Kerns, MD;  Location: [facility] OR;  Service: Vascular;  Laterality: Left;  THROMBECTOMY OF BYPASS GRAFT FEMORAL- POPLITEAL ARTERY Right 11/01/2020  Procedure: POSSIBLE THROMBOLYSIS VERSUS OPEN THROMBECTOMY AND REVSION OF RIGHT FEMORAL-POPLITEAL ARTERY BYPASS;  Surgeon: Sherren Kerns, MD;  [facility] OR;  Service: Vascular;  Laterality: Right;  TUBAL LIGATION    VISCERAL ANGIOGRAPHY N/A 04/25/2019  Procedure: MESENTRIC  ANGIOGRAPHY;  Surgeon: Sherren Kerns, MD;  [facility] INV[facility] CV LAB;  Service: Cardiovascular;  Laterality: N/A;  WOUND  DEBRIDEMENT Left 11/03/2019  Procedure: Debridement Wound of left above the knee amputation;  Surgeon: Sherren Kerns, MD;  Location: [facility] OR;  Service: Vascular;  Laterality: Left; DeBlois, Riley Nearing 09/13/2023, 10:50 AM  DG Chest Portable 1 View Result Date: 09/12/2023 CLINICAL DATA:  Stroke workup slurred speech EXAM: PORTABLE CHEST 1 VIEW COMPARISON:  01/31/2019 FINDINGS: Post sternotomy changes. No acute airspace disease or effusion. Normal cardiomediastinal silhouette with aortic atherosclerosis. Leadless pacemaker is noted. IMPRESSION: No active disease. Electronically Signed   By: Jasmine Pang M.D.   On: 09/12/2023 15:41   CT ANGIO HEAD NECK W WO CM W PERF (CODE STROKE) Result Date: 09/12/2023 CLINICAL DATA:  Aphasia, stroke suspected EXAM: CT ANGIOGRAPHY HEAD AND NECK CT PERFUSION BRAIN TECHNIQUE: Multidetector CT imaging of the head and neck was performed using the standard protocol during bolus administration of intravenous contrast. Multiplanar CT image reconstructions and MIPs were obtained to evaluate the vascular anatomy. Carotid stenosis measurements (when applicable) are obtained utilizing NASCET criteria, using the distal internal carotid diameter as the denominator. Multiphase CT imaging of the brain was performed following IV bolus contrast injection. Subsequent parametric perfusion maps  were calculated using RAPID software. RADIATION DOSE REDUCTION: This exam was performed according to the departmental dose-optimization program which includes automated exposure control, adjustment of the mA and/or kV according to patient size and/or use of iterative reconstruction technique. CONTRAST:  100 mL Omnipaque 350 COMPARISON:  CTA 08/28/2022, CT head 09/12/2023 FINDINGS: CT HEAD FINDINGS For noncontrast findings, please see same day CT head. CTA NECK FINDINGS Aortic arch: The arch is not included in the field of view. Right carotid system: 50% stenosis in the proximal right ICA, secondary to  noncalcified plaque (series 8, image 269). Moderate stenosis at the origin of the right ECA. No evidence of dissection. Previously noted distal ICA occlusion is no longer seen. Left carotid system: 70% stenosis in the proximal left ICA (series 8, image 421). No significant stenosis at the origin of the ECA. No evidence of dissection. Vertebral arteries: No evidence of dissection, occlusion, or hemodynamically significant stenosis (greater than 50%). Skeleton: No acute osseous abnormality. Degenerative changes in the cervical spine. Other neck: 4 mm hypoenhancing nodule in the left thyroid, for which no follow-up is currently indicated. (Reference: J Am Coll Radiol. 2015 Feb;12(2): 143-50). Fluid in the esophagus, which is patulous, increasing the risk for aspiration. Upper chest: No focal pulmonary opacity or pleural effusion. Emphysema. Review of the MIP images confirms the above findings CTA HEAD FINDINGS Anterior circulation: Both internal carotid arteries are patent to the termini, with calcifications but without significant stenosis. A1 segments patent. Normal anterior communicating artery. Occlusion of the right A2 just after its origin (series 8, image 146 and 140), with distal reconstitution (series 8, image 112). The left A2 and distal left ACA branches are patent. Mild stenosis in the proximal right M1. Occlusion of the proximal right M2 insular branch (series 8, image 138), with reconstitution in its M3 branches (series 8, image 133). The more lateral right M3 is subsequently reoccluded distally (series 8, image 124), with additional multifocal occlusion of its downstream branches (series 8, image 123 and 121). While other downstream branches of the more medial right M3 are occluded more superiorly (series 8, image 106). The anterior right M2 and is branches are patent. The left M1 and left MCA branches are patent, without significant stenosis. Posterior circulation: Vertebral arteries patent to the  vertebrobasilar junction without significant stenosis. Posterior inferior cerebellar arteries patent proximally. Basilar patent to its distal aspect without significant stenosis. Superior cerebellar arteries patent proximally. Patent right P1. Fetal origin of the left PCA from the left posterior communicating artery. The right posterior communicating artery is also patent. PCAs perfused to their distal aspects without significant stenosis. Venous sinuses: As permitted by contrast timing, patent. Anatomic variants: Fetal left PCA. No evidence of aneurysm or vascular malformation. Review of the MIP images confirms the above findings CT Brain Perfusion Findings: ASPECTS: 10 CBF (<30%) Volume: 5mL Perfusion (Tmax>6.0s) volume: Mismatch Volume: Infarction Location:Infarct core in the right posterior frontal periventricular white matter, penumbra throughout the posterior right MCA territory. Code stroke imaging results were communicated on 09/12/2023 at 2:27 pm to provider Dr. Wilford Corner via telephone, who verbally acknowledged these results. IMPRESSION: 1. Occlusion of the proximal right M2 insular branch, with reconstitution in its M3 branches and multifocal more downstream occlusion and reconstitution. 2. Occlusion of the right A2 just after its origin, with distal reconstitution. 3. Infarct core in the right posterior frontal periventricular white matter, with penumbra throughout the posterior right MCA territory, measuring 5 mL. 4. 50% stenosis in the proximal right ICA,  secondary to noncalcified plaque. 5. 70% stenosis in the proximal left ICA. 6. Fluid in the esophagus, which is patulous, increasing the risk for aspiration. Right MCA occlusion was discussed on 09/12/2023 at 2:27 pm with provider Dr. Wilford Corner via telephone, who verbally acknowledged these results. Right ACA occlusion was communicated on 09/12/2023 at 2:48 pm to provider Dr. Wilford Corner via secure text paging. Electronically Signed   By: Wiliam Ke  M.D.   On: 09/12/2023 14:49   CT HEAD CODE STROKE WO CONTRAST` Result Date: 09/12/2023 CLINICAL DATA:  Code stroke.  Aphasia EXAM: CT HEAD WITHOUT CONTRAST TECHNIQUE: Contiguous axial images were obtained from the base of the skull through the vertex without intravenous contrast. RADIATION DOSE REDUCTION: This exam was performed according to the departmental dose-optimization program which includes automated exposure control, adjustment of the mA and/or kV according to patient size and/or use of iterative reconstruction technique. COMPARISON:  09/07/2022 CT head FINDINGS: Brain: No evidence of acute infarction, hemorrhage, mass, mass effect, or midline shift. No hydrocephalus or extra-axial collection. Encephalomalacia in the right lentiform nucleus and external capsule, consistent with sequela of the acute infarct noted in January 2024, with widening of the right sylvian fissure. Basal ganglia calcifications. Vascular: No hyperdense vessel. Skull: Negative for fracture or focal lesion. Sinuses/Orbits: Opacification of the right sphenoid sinus, with osseous thickening of the walls. Additional milder mucosal thickening in the maxillary sinuses. No acute finding in the orbits. Other: The mastoid air cells are well aerated. ASPECTS [facility] Stroke Program Early CT Score) - Ganglionic level infarction (caudate, lentiform nuclei, internal capsule, insula, M1-M3 cortex): 7 - Supraganglionic infarction (M4-M6 cortex): 3 Total score (0-10 with 10 being normal): 10 IMPRESSION: 1. No acute intracranial process. ASPECTS is 10. 2. 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 3. Chronic right sphenoid sinusitis. Imaging results were communicated on 09/12/2023 at 12:42 pm to provider Dr. Wilford Corner via secure text paging. Electronically Signed   By: Wiliam Ke M.D.   On: 09/12/2023 12:42        Scheduled Meds:  apixaban  5 mg Oral  BID   ezetimibe  10 mg Oral Daily   fluticasone  2 spray Each Nare Daily   mouth rinse  15 mL Mouth Rinse 4 times per day   rosuvastatin  40 mg Oral Daily   Continuous Infusions:     LOS: 2 days    Time spent: 40 minutes    Dorcas Carrow, MD [facility]

## 2023-09-14 NOTE — [place] FL2 (Addendum)
MEDICAID FL2 LEVEL OF CARE FORM     IDENTIFICATION  Patient Name: Mercedes [name] Birthdate: 1955-12-11 Sex: female Admission Date (Current Location): 09/12/2023  [facility] [place] Number:  Chiropodist and Address:  The [place], [address], [place], [place] [address]      Provider Number: [id number]  Attending Physician Name and Address:  Dorcas Carrow, MD  Relative Name and Phone Number:       Current Level of Care: Hospital Recommended Level of Care: Skilled Nursing Facility Prior Approval Number:    Date Approved/Denied:   PASRR Number: [id number]  Discharge Plan: SNF    Current Diagnoses: Patient Active Problem List   Diagnosis Date Noted   Acute ischemic right MCA stroke (HCC) 09/12/2023   Acute CVA (cerebrovascular accident) (HCC) 09/12/2023   Ulcer of right groin, limited to breakdown of skin (HCC) 07/31/2023   Sjogren's disease (HCC) 09/18/2022   Dysphagia as late effect of cerebrovascular accident (CVA) 09/18/2022   Protein-calorie malnutrition, severe 08/30/2022   History of stroke 12/14/2021   Ulceration of stump of above knee amputation (HCC) 11/09/2021   S/P AKA (above knee amputation), right (HCC), 11/23/20 12/14/2020   S/P AKA (above knee amputation), left (HCC), 10/01/19 10/20/2019   Femoral-popliteal bypass graft occlusion, left (HCC) 09/25/2019   Renal infarct (HCC) 02/01/2019   Diabetes (HCC) 04/01/2017   Hyperthyroidism 02/21/2017   Hx of CABG 07/17/2016   Paroxysmal atrial fibrillation (HCC) 07/12/2016   Phantom limb pain-Left great toe 04/29/2015   Seizure (HCC)    Atherosclerosis of native artery of left lower extremity with rest pain (HCC) 12/18/2013   PAD (peripheral artery disease) (HCC) 12/02/2013   SLE (systemic lupus erythematosus) (HCC)    CAD - S/P Takotsubo MI in 2000, CFX DES Sept 2014 05/25/2013   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   Tachycardia-bradycardia (HCC) 03/09/2010    PVD- s/p Lt [facility] April 2015 10/29/2009   Dyslipidemia 08/26/2009   Essential hypertension 08/26/2009   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   Rheumatoid arthritis (HCC) 08/26/2009    Orientation RESPIRATION BLADDER Height & Weight     Self, Time, Situation, Place  Normal Incontinent Weight: 90 lb 6.2 oz (41 kg) Height:  5\' 5"  (165.1 cm)  BEHAVIORAL SYMPTOMS/MOOD NEUROLOGICAL BOWEL NUTRITION STATUS      Incontinent Diet (DYS 1)  AMBULATORY STATUS COMMUNICATION OF NEEDS Skin   Extensive Assist Non-Verbally Normal                       Personal Care Assistance Level of Assistance  Feeding, Dressing, Bathing Bathing Assistance: Maximum assistance Feeding assistance: Independent Dressing Assistance: Maximum assistance     Functional Limitations Info  Speech     Speech Info: Impaired    SPECIAL CARE FACTORS FREQUENCY  PT (By licensed PT), OT (By licensed OT)     PT Frequency: 5x/week OT Frequency: 5x/week            Contractures      Additional Factors Info  Code Status, Allergies Code Status Info: Full Allergies Info: Brilinta (Ticagrelor), Latex, Tape           Current Medications (09/14/2023):  This is the current hospital active medication list Current Facility-Administered Medications  Medication Dose Route Frequency Provider Last Rate Last Admin   acetaminophen (TYLENOL) 160 MG/5ML solution 650 mg  650 mg Per Tube Q4H PRN Synetta Fail, MD       Or  acetaminophen (TYLENOL) suppository 650 mg  650 mg Rectal Q4H PRN Synetta Fail, MD       apixaban Everlene Balls) tablet 5 mg  5 mg Oral BID de Saintclair Halsted, Cortney E, NP   5 mg at 09/14/23 1610   butalbital-acetaminophen-caffeine (FIORICET) 50-325-40 MG per tablet 1 tablet  1 tablet Oral Q4H PRN Dorcas Carrow, MD   1 tablet at 09/14/23 9604   ezetimibe (ZETIA) tablet 10 mg  10 mg Oral Daily de Saintclair Halsted, Cortney E, NP   10 mg at 09/14/23 0918   fluticasone (FLONASE) 50 MCG/ACT nasal spray 2  spray  2 spray Each Nare Daily Dorcas Carrow, MD       Oral care mouth rinse  15 mL Mouth Rinse 4 times per day Synetta Fail, MD   15 mL at 09/14/23 0900   Oral care mouth rinse  15 mL Mouth Rinse PRN Synetta Fail, MD       rosuvastatin (CRESTOR) tablet 40 mg  40 mg Oral Daily Synetta Fail, MD   40 mg at 09/14/23 5409   senna-docusate (Senokot-S) tablet 1 tablet  1 tablet Oral QHS PRN Synetta Fail, MD         Discharge Medications: Please see discharge summary for a list of discharge medications.  Relevant Imaging Results:  Relevant Lab Results:   Additional Information SSN [id number]  Antion Felipa [place], Student-Social Work

## 2023-09-14 NOTE — Progress Notes (Signed)
STROKE TEAM PROGRESS NOTE   BRIEF HPI [name] is a 68 y.o. female with history of anemia, anxiety, depression, GERD, bilateral AKA's, hyperlipidemia, hypertension, MI, PAD, atrial fibrillation, pacemaker, rheumatoid arthritis, stroke and syndrome, lupus, multiple strokes and Takotsubo cardiomyopathy presenting with slurred speech, aphasia, left-sided facial droop and left arm weakness which were present when she woke up yesterday.  CT angiogram revealed right M2 occlusion and infarct with small core and large penumbra.  However, patient declined mechanical thrombectomy.  NIH on Admission 9   SIGNIFICANT HOSPITAL EVENTS 1/15-patient admitted, found to have right M2 occlusion but declined mechanical thrombectomy  INTERIM HISTORY/SUBJECTIVE  Patient lying comfortably in bed..  She continues to have severe dysarthria with mild expressive aphasia and dense left hemiplegia.  Neurological exam is unchanged. Follow-up CT head yesterday is motion limited but shows new area of acute infarction in the posterior right MCA territory but without significant mass effect or midline shift or hemorrhage  OBJECTIVE  CBC    Component Value Date/Time   WBC 4.4 09/12/2023 1225   RBC 5.24 (H) 09/12/2023 1225   HGB 13.9 09/12/2023 1232   HGB 14.2 10/09/2018 1125   HCT 41.0 09/12/2023 1232   HCT 44.6 10/09/2018 1125   PLT 335 09/12/2023 1225   PLT 289 10/09/2018 1125   MCV 77.7 (L) 09/12/2023 1225   MCV 79 10/09/2018 1125   MCH 23.9 (L) 09/12/2023 1225   MCHC 30.7 09/12/2023 1225   RDW 16.9 (H) 09/12/2023 1225   RDW 14.8 10/09/2018 1125   LYMPHSABS 1.4 09/12/2023 1225   MONOABS 0.4 09/12/2023 1225   EOSABS 0.1 09/12/2023 1225   BASOSABS 0.1 09/12/2023 1225    BMET    Component Value Date/Time   NA 139 09/12/2023 1232   NA 135 10/09/2018 1125   K 3.9 09/12/2023 1232   CL 108 09/12/2023 1232   CO2 23 09/12/2023 1225   GLUCOSE 97 09/12/2023 1232   BUN 10 09/12/2023 1232   BUN 10  10/09/2018 1125   CREATININE 0.90 09/12/2023 1232   CREATININE 1.13 (H) 08/03/2016 1440   CALCIUM 9.0 09/12/2023 1225   GFRNONAA >60 09/12/2023 1225    IMAGING past 24 hours CT HEAD WO CONTRAST ( ) Result Date: 09/13/2023 CLINICAL DATA:  Stroke, follow up EXAM: CT HEAD WITHOUT CONTRAST TECHNIQUE: Contiguous axial images were obtained from the base of the skull through the vertex without intravenous contrast. RADIATION DOSE REDUCTION: This exam was performed according to the departmental dose-optimization program which includes automated exposure control, adjustment of the mA and/or kV according to patient size and/or use of iterative reconstruction technique. COMPARISON:  CT head 09/12/2023. FINDINGS: Brain: Motion limited study with new areas of hypoattenuation and loss of gray differentiation in the posterior right MCA territory including the parietal and temporal lobes, compatible with for acute infarct. No evidence of mass occupying acute hemorrhage, mass lesion or hydrocephalus. Vascular: Not well evaluated due to motion. Skull: No acute fracture. Sinuses/Orbits: Clear sinuses.  No acute findings. Other: No mastoid effusions. IMPRESSION: Motion limited study with new areas of acute infarct in the posterior right MCA territory. No significant mass effect or midline shift. Electronically Signed   By: Feliberto Harts M.D.   On: 09/13/2023 20:58    Vitals:   09/14/23 0006 09/14/23 0335 09/14/23 0736 09/14/23 1216  BP: (!) 151/87 (!) 140/80 (!) 155/75 (!) 152/86  Pulse: 63 64 (!) 48 61  Resp: 16 16 18 18   Temp: 98.6 F (37 C)  97.7 F (36.5 C) 98.8 F (37.1 C) 98.1 F (36.7 C)  TempSrc: Oral Oral Oral Oral  SpO2: 98% 100% 99% 100%  Weight:      Height:         PHYSICAL EXAM General: Elderly patient with bilateral AKA's in no acute distress Psych:  Mood and affect appropriate for situation Respiratory:  Regular, unlabored respirations on room air   NEURO:  Mental Status: Patient  responds to name and follow simple commands but is largely nonverbal Speech/Language: speech is in single words with significant dysarthria and expressive aphasia.  Good comprehension  Cranial Nerves:  II: PERRL.  Does not blink to threat on the left III, IV, VI: Right gaze preference, able to go to midline VII: Left facial droop VIII: hearing intact to voice. IX, X: Voice is dysarthric XII: tongue is midline without fasciculations. Motor: Able to move right upper and lower extremities with full antigravity strength, some antigravity strength, 3/5 in left upper and lower extremities Tone: is normal and bulk is normal Sensation- Intact to light touch bilaterally.  Coordination: Unable to perform Gait- deferred      ASSESSMENT/PLAN  Acute Ischemic Infarct:  right MCA territory stroke due to right M2 and A2 occlusion likely cardioembolic from A-fib despite anticoagulation with Eliquis Etiology: Cardiogenic embolism from A-fib on anticoagulation Code Stroke CT head No acute abnormality.  Encephalomalacia in right lentiform nucleus and external capsule ASPECTS 10.    CTA head & neck lesion of proximal right M2 insular branch with reconstitution and M3 branches, occlusion of right A2 just after its origin, 50% stenosis of proximal right ICA, 70% stenosis of proximal left ICA CT perfusion 5 mL infarct core in right posterior frontal periventricular white matter with large penumbra Follow-up CT 1/16 pending MRI unable to perform due to pacemaker 2D Echo EF 55 to 60%, moderate LVH of the basal septal segment, grade 1 diastolic dysfunction, normal left atrial size, no atrial level shunt LDL 83 HgbA1c 6.0 VTE prophylaxis -fully anticoagulated with Eliquis Eliquis (apixaban) daily prior to admission, Eliquis now resumed Therapy recommendations:  SNF Disposition: Pending  Hx of Stroke/TIA Patient has a 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 status post mechanical  thrombectomy Patient also had a stroke in 3/23, also likely embolic  Atrial fibrillation Home Meds: Eliquis Continue telemetry monitoring Continue Eliquis if follow-up CT shows no hemorrhagic transformation  Hypertension Home meds: Nifedipine 30 mg daily Stable Blood Pressure Goal: BP less than 220/110   Hyperlipidemia Home meds: Rosuvastatin 40 mg daily, resumed in hospital LDL 83, goal < 70 Add ezetimibe 10 mg daily  Continue statin at discharge  Diabetes type II Controlled Home meds: None HgbA1c 6.0, goal < 7.0 CBGs SSI Recommend close follow-up with PCP for better DM control   Dysphagia Patient has post-stroke dysphagia, SLP consulted    Diet   DIET - DYS 1 Room service appropriate? No; Fluid consistency: Honey Thick   Advance diet as tolerated  Other Stroke Risk Factors Coronary artery disease   Other Active Problems History of Takotsubo cardiomyopathy-EF 55 to 60% at this time   Patient presented with left hemiparesis and speech difficulties due to right M2 and A2 occlusion and CT perfusion showed a small cord with significant penumbra but unfortunately patient declined mechanical thrombectomy and was not candidate for TNK due to being on Eliquis.  Continue Eliquis.  Continue ongoing physical occupational speech therapy consults.  Will continue Eliquis as there is no  definite data to suggest changing anticoagulation is necessary a better option.  No family at the bedside.  Discussed with Dr. Jerral Ralph.  Stroke team will sign off.  Can call for questions Delia Heady, MD Medical Director [facility] Pager: [phone] 09/14/2023 1:20 PM   To contact Stroke Continuity provider, please refer to [email or web address]. After hours, contact General Neurology

## 2023-09-15 DIAGNOSIS — I63511 Cerebral infarction due to unspecified occlusion or stenosis of right middle cerebral artery: Secondary | ICD-10-CM | POA: Diagnosis not present

## 2023-09-15 MED ORDER — ACETAMINOPHEN 325 MG PO TABS
650.0000 mg | ORAL_TABLET | Freq: Once | ORAL | Status: AC
  Administered 2023-09-15: 650 mg via ORAL
  Filled 2023-09-15: qty 2

## 2023-09-15 MED ORDER — ACETAMINOPHEN 325 MG PO TABS
650.0000 mg | ORAL_TABLET | Freq: Four times a day (QID) | ORAL | Status: DC | PRN
  Administered 2023-09-15 – 2023-09-17 (×4): 650 mg via ORAL
  Filled 2023-09-15 (×4): qty 2

## 2023-09-15 MED ORDER — LORATADINE 10 MG PO TABS
10.0000 mg | ORAL_TABLET | Freq: Every day | ORAL | Status: DC | PRN
  Administered 2023-09-15: 10 mg via ORAL
  Filled 2023-09-15: qty 1

## 2023-09-15 MED ORDER — SALINE SPRAY 0.65 % NA SOLN
2.0000 | NASAL | Status: DC | PRN
  Administered 2023-09-15: 2 via NASAL
  Filled 2023-09-15: qty 44

## 2023-09-15 NOTE — Plan of Care (Signed)
Pt is alert and oriented x 3. With NIH pt was confused to month. Pt requested tylenol. Only tylenol per peg. MD contacted to change route. MD added a one time dose of tylenol. NIH stable. Vitals stable. Meds crushed in applesauce. Pt refused to mobility.  Problem: Education: Goal: Knowledge of disease or condition will improve Outcome: Progressing Goal: Knowledge of secondary prevention will improve (MUST DOCUMENT ALL) Outcome: Progressing Goal: Knowledge of patient specific risk factors will improve (DELETE if not current risk factor) Outcome: Progressing   Problem: Ischemic Stroke/TIA Tissue Perfusion: Goal: Complications of ischemic stroke/TIA will be minimized Outcome: Progressing   Problem: Coping: Goal: Will verbalize positive feelings about self Outcome: Progressing Goal: Will identify appropriate support needs Outcome: Progressing   Problem: Health Behavior/Discharge Planning: Goal: Ability to manage health-related needs will improve Outcome: Progressing Goal: Goals will be collaboratively established with patient/family Outcome: Progressing   Problem: Self-Care: Goal: Ability to participate in self-care as condition permits will improve Outcome: Progressing Goal: Verbalization of feelings and concerns over difficulty with self-care will improve Outcome: Progressing Goal: Ability to communicate needs accurately will improve Outcome: Progressing   Problem: Nutrition: Goal: Risk of aspiration will decrease Outcome: Progressing Goal: Dietary intake will improve Outcome: Progressing   Problem: Education: Goal: Knowledge of General Education information will improve Description: Including pain rating scale, medication(s)/side effects and non-pharmacologic comfort measures Outcome: Progressing   Problem: Health Behavior/Discharge Planning: Goal: Ability to manage health-related needs will improve Outcome: Progressing   Problem: Clinical Measurements: Goal: Ability  to maintain clinical measurements within normal limits will improve Outcome: Progressing Goal: Will remain free from infection Outcome: Progressing Goal: Diagnostic test results will improve Outcome: Progressing Goal: Respiratory complications will improve Outcome: Progressing Goal: Cardiovascular complication will be avoided Outcome: Progressing   Problem: Activity: Goal: Risk for activity intolerance will decrease Outcome: Progressing   Problem: Nutrition: Goal: Adequate nutrition will be maintained Outcome: Progressing   Problem: Coping: Goal: Level of anxiety will decrease Outcome: Progressing   Problem: Elimination: Goal: Will not experience complications related to bowel motility Outcome: Progressing Goal: Will not experience complications related to urinary retention Outcome: Progressing   Problem: Pain Managment: Goal: General experience of comfort will improve and/or be controlled Outcome: Progressing   Problem: Safety: Goal: Ability to remain free from injury will improve Outcome: Progressing   Problem: Skin Integrity: Goal: Risk for impaired skin integrity will decrease Outcome: Progressing

## 2023-09-15 NOTE — Plan of Care (Signed)
No acute changes, Conts on calorie count.   Problem: Ischemic Stroke/TIA Tissue Perfusion: Goal: Complications of ischemic stroke/TIA will be minimized Outcome: Not Met (add Reason)   Problem: Coping: Goal: Will verbalize positive feelings about self Outcome: Not Met (add Reason) Goal: Will identify appropriate support needs Outcome: Not Met (add Reason)   Problem: Self-Care: Goal: Ability to participate in self-care as condition permits will improve Outcome: Not Met (add Reason) Goal: Verbalization of feelings and concerns over difficulty with self-care will improve Outcome: Not Met (add Reason) Goal: Ability to communicate needs accurately will improve Outcome: Not Met (add Reason)   Problem: Pain Managment: Goal: General experience of comfort will improve and/or be controlled Outcome: Not Met (add Reason)   Problem: Safety: Goal: Ability to remain free from injury will improve Outcome: Not Met (add Reason)   Problem: Elimination: Goal: Will not experience complications related to bowel motility Outcome: Not Met (add Reason) Goal: Will not experience complications related to urinary retention Outcome: Not Met (add Reason)   Problem: Skin Integrity: Goal: Risk for impaired skin integrity will decrease Outcome: Not Met (add Reason)

## 2023-09-15 NOTE — Progress Notes (Signed)
Initial Nutrition Assessment  DOCUMENTATION CODES:   Underweight  INTERVENTION:   - Initiate formal 48-hour calorie count to begin 09/15/23 with breakfast meal. RN to hang calorie count envelope on pt's door. RD to provide results on Monday, 09/17/23.  - Magic Cup TID with meals, each supplement provides 290 kcal and 9 grams of protein  NUTRITION DIAGNOSIS:   Inadequate oral intake related to dysphagia as evidenced by meal completion < 50%.  GOAL:   Patient will meet greater than or equal to 90% of their needs  MONITOR:   PO intake, Supplement acceptance, Diet advancement, Labs, Weight trends  REASON FOR ASSESSMENT:   Consult Calorie Count  ASSESSMENT:   68 year old female who presented to the ED on 1/15 with speech difficulty and left-sided weakness. PMH of stroke, HTN, HLD, CAD s/p CABG, Takotsubo cardiomyopathy, PAD s/p bypass and bilateral AKA, atrial fibrillation, tachybradycardia syndrome s/p pacemaker, T2DM, rheumatoid arthritis, Sjogren's syndrome, SLE, seizures, hypothyroidism, GERD, anxiety, depression. Pt admitted with acute R MCA territory stroke.  01/16 - MBS, diet advanced to dysphagia 1 with honey-thick liquids  RD working remotely. RD consulted for calorie count. Discussed pt with RN via secure chat. Calorie count envelope was not hung yesterday so calorie count had not been started. RN has been documenting meal completions but has not been documenting percent of each meal item consumed. Plan is for RN to hang calorie count envelope on pt's door and begin more detailed documentation on meal tickets for RD to review.  Using meal completion percentages alone for 09/14/23 (0% breakfast, 30% lunch, 20% dinner), pt is estimated to have consumed 440 kcal (31% of minimum kcal needs) and 27 grams of protein (39% of minimum protein needs).  Documentation in chart shows pt consumed 100% of breakfast meal today. This alone provides 771 kcal and 39 grams of protein.  Unable  to reach pt via phone. Per notes, pt demonstrates a profound dysarthria. Unable to obtain diet and weight history at this time. Reviewed weight history in chart. Weight of 41 kg appears copied over from previous encounter. If accurate, pt has experienced a 6% weight loss over the last 1 year which is not clinically significant for timeframe. Pt met criteria for severe malnutrition on previous hospital admissions. Suspect malnutrition persists but unable to confirm without NFPE.  Will order Magic Cups to come TID with meals. Magic Cups come automatically on lunch and dinner meals but will ensure they are ordered for breakfast as well.  Medications reviewed.  Labs reviewed: ionized calcium 1.14, HDL 40, hemoglobin A1C 6.0  NUTRITION - FOCUSED PHYSICAL EXAM:  Unable to complete RD working remotely.  Diet Order:   Diet Order             DIET - DYS 1 Room service appropriate? No; Fluid consistency: Honey Thick  Diet effective now                   EDUCATION NEEDS:   Not appropriate for education at this time  Skin:  Skin Assessment: Reviewed RN Assessment  Last BM:  09/11/23  Height:   Ht Readings from Last 1 Encounters:  09/12/23 5\' 5"  (1.651 m)    Weight:   Wt Readings from Last 1 Encounters:  09/12/23 41 kg   IBW: 47 kg (bilateral AKA)  BMI:  Body mass index is 15.04 kg/m.  Adjusted BMI for bilateral AKA: 17.9 kg/m2  Estimated Nutritional Needs:   Kcal:  1400-1600  Protein:  70-85 grams  Fluid:  1.4-1.6 L    Mertie Clause, MS, RD, LDN Registered Dietitian II Please see AMiON for contact information.

## 2023-09-15 NOTE — Progress Notes (Signed)
PROGRESS NOTE    Mercedes [name]  ZOX:[id number] DOB: [birth year] DOA: 09/12/2023 PCP: Pincus Sanes, MD    Brief Narrative:  68 year old female with extensive medical history including history of stroke, hypertension, hyperlipidemia, coronary artery disease status post CABG, history of Takotsubo cardiomyopathy, peripheral artery disease status post bypass and now with bilateral above-knee amputation, atrial fibrillation, tachybradycardia syndrome status post pacemaker, type 2 diabetes, Sjogren's syndrome, seizure disorder, hypothyroidism and GERD presented from home with focal neurological deficits.  Patient lives at home and reported falling out of chair 1 day prior to admit.  In the morning, she spoke with her granddaughter who noted unintelligible speech, granddaughter came home to see her and found with profound weakness and left-sided facial droop and dysarthria so EMS was called.  Patient was brought to the emergency room as code stroke.  Thrombectomy was suggested for right M2, after multiple discussions this was not carried out.  Admitted for supportive care and further stroke workup.  Subjective:  Patient seen and examined.  Headache is better.  She was more comfortable.  She was able to eat everything out of her plate even though it does not taste good.  Calorie count in progress.  Assessment & Plan:   Acute right MCA territory stroke: Clinical findings, left-sided deficits, facial droop, arm weakness and numbness.  Severe dysarthria. CTA head and neck, occlusion of right M2 and A2 with infarct. MRI of the brain, unable to do because of incompatible MRI.  Repeat CT head without any acute findings. 2D echocardiogram, normal ejection fraction.  Regional wall motion abnormality which is likely chronic.  No intracardiac thrombus. Antiplatelet therapy, on Eliquis at home.  Continued. LDL 83, started on Crestor 40 mg daily. Hemoglobin A1c, 6.  No indication for treatment. Therapy  recommendations , a skilled nursing facility placement. Patient was not a tPA candidate because of unknown duration, thrombectomy was suggested however declined. Significant dysarthria, seen by speech therapy, started on dysphagia 1 diet with honey thick liquid.    Profound dysarthria and dysphagia: Started on dysphagia 1 diet.  Not meeting adequate oral intake.  Start calorie count to ensure adequate intake before discharging.  Essential hypertension blood pressures stable.  Holding antihypertensives.  Will resume slowly.  Hyperlipidemia: As above.  On Crestor.  Coronary artery disease status post CABG: On statin, currently on Eliquis.  History of A-fib: Sinus rhythm.  On Eliquis.  Has pacemaker due to tachybradycardia syndrome.  Peripheral artery disease: Status post bypass, subsequent bilateral BKA.  On Eliquis and rosuvastatin.     DVT prophylaxis: SCD's Start: 09/12/23 1433 apixaban (ELIQUIS) tablet 5 mg   Code Status: Full code Family Communication: None today. Disposition Plan: Status is: Inpatient Remains inpatient appropriate because: Stroke workup, oral intake evaluation.     Consultants:  Neurology Palliative care,  Procedures:  None  Antimicrobials:  None     Objective: Vitals:   09/14/23 2002 09/14/23 2333 09/15/23 0316 09/15/23 0810  BP: (!) 145/95 (!) 159/73 138/67 (!) 147/76  Pulse: 64 69 62 71  Resp: 18 18 18 18   Temp: 99.3 F (37.4 C) 99.5 F (37.5 C) 98.8 F (37.1 C) 97.8 F (36.6 C)  TempSrc: Oral Oral Oral Oral  SpO2: 98% 96% 97% 97%  Weight:      Height:        Intake/Output Summary (Last 24 hours) at 09/15/2023 1100 Last data filed at 09/15/2023 1000 Gross per 24 hour  Intake 220 ml  Output --  Net 220  ml   Filed Weights   09/12/23 1213  Weight: 41 kg    Examination:  General: Chronically sick looking and frail.  Not in any distress.  On room air.  Pleasant and interactive but severely dysarthric. Cardiovascular: S1-S2  normal.  Regular rate rhythm. Respiratory: Bilateral clear.  No added sounds. Gastrointestinal: Soft.  Nontender.  Bowel sound present. Ext: No swelling or edema. Neuro: Alert awake and mostly oriented.  Profoundly dysarthric so difficult to interpret but she is able to write with right hand without difficulties.  Left facial droop. She can move right upper extremity and amputated right lower extremity with normal power. Left upper extremity 0/5, left lower extremity that has a bony amputation stump she can move at the hip joint with 3/5.   Data Reviewed: I have personally reviewed following labs and imaging studies  CBC: Recent Labs  Lab 09/12/23 1225 09/12/23 1232  WBC 4.4  --   NEUTROABS 2.4  --   HGB 12.5 13.9  HCT 40.7 41.0  MCV 77.7*  --   PLT 335  --    Basic Metabolic Panel: Recent Labs  Lab 09/12/23 1225 09/12/23 1232  NA 138 139  K 3.9 3.9  CL 107 108  CO2 23  --   GLUCOSE 99 97  BUN 9 10  CREATININE 0.89 0.90  CALCIUM 9.0  --    GFR: Estimated Creatinine Clearance: 38.2 mL/min (by C-G formula based on SCr of 0.9 mg/dL). Liver Function Tests: Recent Labs  Lab 09/12/23 1225  AST 18  ALT 7  ALKPHOS 71  BILITOT 0.7  PROT 7.6  ALBUMIN 2.8*   No results for input(s): "LIPASE", "AMYLASE" in the last 168 hours. No results for input(s): "AMMONIA" in the last 168 hours. Coagulation Profile: Recent Labs  Lab 09/12/23 1225  INR 1.1   Cardiac Enzymes: No results for input(s): "CKTOTAL", "CKMB", "CKMBINDEX", "TROPONINI" in the last 168 hours. BNP (last 3 results) No results for input(s): "PROBNP" in the last 8760 hours. HbA1C: Recent Labs    09/12/23 1225  HGBA1C 6.0*   CBG: Recent Labs  Lab 09/12/23 1215 09/12/23 1223  GLUCAP 98 91   Lipid Profile: Recent Labs    09/13/23 0611  CHOL 141  HDL 40*  LDLCALC 83  TRIG 90  CHOLHDL 3.5   Thyroid Function Tests: No results for input(s): "TSH", "T4TOTAL", "FREET4", "T3FREE", "THYROIDAB" in  the last 72 hours. Anemia Panel: No results for input(s): "VITAMINB12", "FOLATE", "FERRITIN", "TIBC", "IRON", "RETICCTPCT" in the last 72 hours. Sepsis Labs: No results for input(s): "PROCALCITON", "LATICACIDVEN" in the last 168 hours.  Recent Results (from the past 240 hours)  Resp panel by RT-PCR (RSV, Flu A&B, Covid) Anterior Nasal Swab     Status: None   Collection Time: 09/12/23  3:18 PM   Specimen: Anterior Nasal Swab  Result Value Ref Range Status   SARS Coronavirus 2 by RT PCR NEGATIVE NEGATIVE Final   Influenza A by PCR NEGATIVE NEGATIVE Final   Influenza B by PCR NEGATIVE NEGATIVE Final    Comment: (NOTE) The Xpert Xpress SARS-CoV-2/FLU/RSV plus assay is intended as an aid in the diagnosis of influenza from Nasopharyngeal swab specimens and should not be used as a sole basis for treatment. Nasal washings and aspirates are unacceptable for Xpert Xpress SARS-CoV-2/FLU/RSV testing.  Fact Sheet for Patients: BloggerCourse.com  Fact Sheet for Healthcare Providers: SeriousBroker.it  This test is not yet approved or cleared by the [place] and has been authorized  for detection and/or diagnosis of SARS-CoV-2 by FDA under an Emergency Use Authorization (EUA). This EUA will remain in effect (meaning this test can be used) for the duration of the COVID-19 declaration under Section 564(b)(1) of the Act, 21 U.S.C. section 360bbb-3(b)(1), unless the authorization is terminated or revoked.     Resp Syncytial Virus by PCR NEGATIVE NEGATIVE Final    Comment: (NOTE) Fact Sheet for Patients: BloggerCourse.com  Fact Sheet for Healthcare Providers: SeriousBroker.it  This test is not yet approved or cleared by the [place] FDA and has been authorized for detection and/or diagnosis of SARS-CoV-2 by FDA under an Emergency Use Authorization (EUA). This EUA will remain in  effect (meaning this test can be used) for the duration of the COVID-19 declaration under Section 564(b)(1) of the Act, 21 U.S.C. section 360bbb-3(b)(1), unless the authorization is terminated or revoked.  Performed at [facility], [address] [place], [place] [address]          Radiology Studies: CT HEAD WO CONTRAST ( ) Result Date: 09/13/2023 CLINICAL DATA:  Stroke, follow up EXAM: CT HEAD WITHOUT CONTRAST TECHNIQUE: Contiguous axial images were obtained from the base of the skull through the vertex without intravenous contrast. RADIATION DOSE REDUCTION: This exam was performed according to the departmental dose-optimization program which includes automated exposure control, adjustment of the mA and/or kV according to patient size and/or use of iterative reconstruction technique. COMPARISON:  CT head 09/12/2023. FINDINGS: Brain: Motion limited study with new areas of hypoattenuation and loss of gray differentiation in the posterior right MCA territory including the parietal and temporal lobes, compatible with for acute infarct. No evidence of mass occupying acute hemorrhage, mass lesion or hydrocephalus. Vascular: Not well evaluated due to motion. Skull: No acute fracture. Sinuses/Orbits: Clear sinuses.  No acute findings. Other: No mastoid effusions. IMPRESSION: Motion limited study with new areas of acute infarct in the posterior right MCA territory. No significant mass effect or midline shift. Electronically Signed   By: Feliberto Harts M.D.   On: 09/13/2023 20:58        Scheduled Meds:  apixaban  5 mg Oral BID   ezetimibe  10 mg Oral Daily   fluticasone  2 spray Each Nare Daily   mouth rinse  15 mL Mouth Rinse 4 times per day   rosuvastatin  40 mg Oral Daily   Continuous Infusions:     LOS: 3 days    Time spent: 40 minutes    Dorcas Carrow, MD [facility]

## 2023-09-15 NOTE — Progress Notes (Signed)
     Referral received for Mercedes [name]: goals of care discussion. Chart reviewed and updates received from RN. Patient would like daughter present for goals of care conversations. Patient's sister attempted to call her niece but was unable to reach her. They will determine a good time for goals of care conversation and let PMT know.  Detailed note and recommendations to follow once GOC has been completed.   Thank you for your referral and allowing PMT to assist in Mercedes [name]'s care.   Sarina Ser, NP Palliative Medicine Team  Team Phone # [phone]   NO CHARGE

## 2023-09-16 DIAGNOSIS — Z7189 Other specified counseling: Secondary | ICD-10-CM

## 2023-09-16 DIAGNOSIS — Z515 Encounter for palliative care: Secondary | ICD-10-CM

## 2023-09-16 DIAGNOSIS — I63511 Cerebral infarction due to unspecified occlusion or stenosis of right middle cerebral artery: Secondary | ICD-10-CM | POA: Diagnosis not present

## 2023-09-16 MED ORDER — OXYCODONE HCL 5 MG PO TABS
5.0000 mg | ORAL_TABLET | ORAL | Status: DC | PRN
  Administered 2023-09-16 – 2023-09-17 (×3): 5 mg via ORAL
  Filled 2023-09-16 (×3): qty 1

## 2023-09-16 MED ORDER — NIFEDIPINE ER OSMOTIC RELEASE 30 MG PO TB24
30.0000 mg | ORAL_TABLET | Freq: Every day | ORAL | Status: DC
Start: 2023-09-16 — End: 2023-09-17
  Administered 2023-09-16 – 2023-09-17 (×2): 30 mg via ORAL
  Filled 2023-09-16 (×2): qty 1

## 2023-09-16 MED ORDER — OXYCODONE HCL 5 MG PO TABS: 5.0000 mg | ORAL_TABLET | Freq: Four times a day (QID) | ORAL | Status: DC | PRN

## 2023-09-16 NOTE — Plan of Care (Signed)
  Problem: Health Behavior/Discharge Planning: Goal: Ability to manage health-related needs will improve Outcome: Progressing   Problem: Health Behavior/Discharge Planning: Goal: Goals will be collaboratively established with patient/family Outcome: Progressing   Problem: Self-Care: Goal: Ability to participate in self-care as condition permits will improve Outcome: Progressing

## 2023-09-16 NOTE — Plan of Care (Signed)
Patient has an been eating better. Conts to have headache; meds administered. Possible DC tomorrow.   Problem: Ischemic Stroke/TIA Tissue Perfusion: Goal: Complications of ischemic stroke/TIA will be minimized Outcome: Not Met (add Reason)   Problem: Coping: Goal: Will verbalize positive feelings about self Outcome: Not Met (add Reason) Goal: Will identify appropriate support needs Outcome: Not Met (add Reason)   Problem: Nutrition: Goal: Risk of aspiration will decrease Outcome: Not Met (add Reason) Goal: Dietary intake will improve Outcome: Not Met (add Reason)   Problem: Pain Managment: Goal: General experience of comfort will improve and/or be controlled Outcome: Not Met (add Reason)   Problem: Safety: Goal: Ability to remain free from injury will improve Outcome: Not Met (add Reason)   Problem: Skin Integrity: Goal: Risk for impaired skin integrity will decrease Outcome: Not Met (add Reason)

## 2023-09-16 NOTE — Consult Note (Signed)
Palliative Care Consult Note                                  Date: 09/16/2023   Patient Name: Mercedes [name]  DOB: [birth year]  MRN: [id number]  Age / Sex: 68 y.o., female  PCP: Pincus Sanes, MD Referring Physician: Dorcas Carrow, MD  Reason for Consultation: Establishing goals of care  HPI/Patient Profile: 68 y.o. female  with past medical history of HTN, HLD, stoke, CAD s/p CABG, Takotsubo cardiomyopathy, PAD s/p bypass, bilateral above-knee amputation, tachybradycardia s/p pacemaker, Sjogren's syndrome admitted on 09/12/2023 with focal neurological deficits.   Patient lives at home and reported falling out of chair 1 day prior to admit. In the morning, she spoke with her granddaughter who noted unintelligible speech, granddaughter came home to see her and found with profound weakness and left-sided facial droop and dysarthria so EMS was called. Patient was brought to the emergency room as code stroke. Thrombectomy was suggested for right M2, after multiple discussions this was not carried out. Admitted for supportive care and further stroke workup.   Past Medical History:  Diagnosis Date   Abnormal LFTs    a. in the past when on Lipitor   Acute ischemic right ICA stroke (HCC) 08/28/2022   Anemia    as a young woman   Anxiety    Arnold-Chiari malformation, type I (HCC)    MRI brain 02/2010   Atrial flutter with rapid ventricular response vs. SVT 10/27/2014   CAD (coronary artery disease)    a. NSTEMI 07/2009: LHC - D2 40%, LAD irreg., EF 50% with apical AK (tako-tsubo CM);  b. Inf STEMI (04/2013): Tx Promus DES to CFX;  c. 08/2013 Lexi CL: No ischemia, dist ant/ap/inf-ap infarct, EF  d. 06/2016 NSTEMI with LM disease: s/p CABG on 07/17/2016 w/ LIMA-LAD, SVG-OM, and SVG-dRCA.   DEPRESSION    Dizziness    ? 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 anticoagulated with Eliquis 10/2014   Presence of permanent cardiac pacemaker 10/2014   leadless permanent pacemaker   RA (rheumatoid arthritis) (HCC)    prior tx by Dr. Dareen Piano   Sjogren's syndrome (HCC) 06/02/2013   pt denies this (12/01/13)   SLE (systemic lupus erythematosus) (HCC) dx 05/2013   follows with rheum Dareen Piano)   Stroke [facility])    March 2023, weakness in right arm is improving   Tachy-brady syndrome (HCC)    a. 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   f/u echo 09/2009: EF 50-55%, mild LVH, mod diast dysfxn, mild apical HK   Wears dentures    Wears glasses     Subjective:   I have reviewed medical records including EPIC notes, labs and imaging, received update from nursing, assessed the patient and then met with the patient, her daughter Mercedes, and sister Mercedes [name] (by phone) to discuss diagnosis prognosis, GOC, EOL wishes, disposition and options.  I introduced Palliative Medicine as specialized medical  care for people living with serious illness. It focuses on providing relief from symptoms and stress of a serious illness. The goal is to improve quality of life for both the patient and the family.   Today's Discussion: The patient and her family have a good understanding of her chronic conditions and her stroke this hospitalization. This is not the patient's first stroke so she and family are very familiar with PT, OT, and SLP. She shares that her biggest goal is to be able to speak again.  Prior to this admission the patient was living at home and able to do many of her ADLs and IADLs. There was a nursing aid that would  assist her 2 hours a day. She had a great appetite. She enjoyed spending time with her 6 great grandchildren.   A discussion was had today regarding advanced directives. Discussed concepts specific to code status and scope of care. Recommended consideration of DNR status, understanding evidenced-based poor outcomes in similar hospitalized patients, as the cause of the arrest is likely associated with chronic/terminal disease rather than a reversible acute cardio-pulmonary event. Patient stated she wants her daughter and sister to make this decision. Her daughter encouraged her to make the decision. She needs more time to consider her options. Patient remains FULL code. We discuss scopes of care and the MOST form was introduced and discussed. Patient is very clear that she would not want to be intubated or have a feeding tube. I share that while she is full code if a code were run she would likely be intubated. She understands this. We discuss completing a MOST form after the patient has some time to consider code status.  The patient and family were initially considering discharge to a SNF with rehab but would now rather go home with PT, OT, and SLP services. Notified provider.  Discussed the importance of continued conversation with family and the medical providers regarding overall plan of care and treatment options, ensuring decisions are within the context of the patient's values and GOCs.  Questions and concerns were addressed. Hard Choices booklet left for review. The family was encouraged to call with questions or concerns. PMT will continue to support holistically.  Review of Systems  Objective:   Primary Diagnoses: Present on Admission:  Acute ischemic right MCA stroke (HCC)  Essential hypertension  Dyslipidemia  PAD (peripheral artery disease) (HCC)  Paroxysmal atrial fibrillation (HCC)  Tachycardia-bradycardia (HCC)  Rheumatoid arthritis (HCC)  SLE (systemic lupus erythematosus)  (HCC)  Hyperthyroidism  Sjogren's disease (HCC)  Acute CVA (cerebrovascular accident) [facility])   Physical Exam Constitutional:      General: She is not in acute distress. HENT:     Head: Normocephalic and atraumatic.  Cardiovascular:     Rate and Rhythm: Normal rate.  Pulmonary:     Effort: Pulmonary effort is normal.  Musculoskeletal:     Comments: Bilateral AKA  Skin:    General: Skin is warm and dry.  Neurological:     Mental Status: She is alert and oriented to person, place, and time.  Psychiatric:        Mood and Affect: Mood normal.        Behavior: Behavior normal.     Vital Signs:  BP 122/73 (BP Location: Right Arm)   Pulse 61   Temp 99 F (37.2 C) (Axillary)   Resp 16   Ht 5\' 5"  (1.651 m)   Wt 41 kg   SpO2 98%   BMI 15.04 kg/m  Advanced Care Planning:   Existing Vynca/ACP Documentation: None   Primary Decision Maker: PATIENT. If patient were unable to make decisions she would want her daughter Mercedes [name] and sister Mercedes [name] to be her proxy decision makers. She hopes to complete HCPOA documentation while hospitalized.  Code Status/Advance Care Planning: Full code    Assessment & Plan:   SUMMARY OF RECOMMENDATIONS   Full code Patient would not want intubation or feeding tube Encouraged patient and family to continue conversations re: goals of care Complete HCPOA (spiritual care consulted) and MOST form during this hospitalization Continued PMT support   Discussed with: Dr. Jerral Ralph and bedside RN  Time Total: 75 minutes  Thank you for allowing [place] to participate in the care of SINAY SNELL PMT will continue to support holistically.    Signed by: Sarina Ser, NP Palliative Medicine Team  Team Phone # [phone] (Nights/Weekends)  09/16/2023, 1:26 PM

## 2023-09-16 NOTE — Progress Notes (Signed)
PROGRESS NOTE    Mercedes [name]  WGN:[id number] DOB: 1956/02/11 DOA: 09/12/2023 PCP: Pincus Sanes, MD    Brief Narrative:  68 year old female with extensive medical history including history of stroke, hypertension, hyperlipidemia, coronary artery disease status post CABG, history of Takotsubo cardiomyopathy, peripheral artery disease status post bypass and now with bilateral above-knee amputation, atrial fibrillation, tachybradycardia syndrome status post pacemaker, type 2 diabetes, Sjogren's syndrome, seizure disorder, hypothyroidism and GERD presented from home with focal neurological deficits.  Patient lives at home and reported falling out of chair 1 day prior to admit.  In the morning, she spoke with her granddaughter who noted unintelligible speech, granddaughter came home to see her and found with profound weakness and left-sided facial droop and dysarthria so EMS was called.  Patient was brought to the emergency room as code stroke.  Thrombectomy was suggested for right M2, after multiple discussions this was not carried out.  Admitted for supportive care and further stroke workup.  Subjective:  Patient seen and examined.  Still has intermittent headache and Tylenol helped.  She is very motivated to eat and has been eating 50 to 100% of the meals as recorded.  Patient tells me that she would eat all her breakfast.  Assessment & Plan:   Acute right MCA territory stroke: Clinical findings, left-sided deficits, facial droop, arm weakness and numbness.  Severe dysarthria. CTA head and neck, occlusion of right M2 and A2 with infarct. MRI of the brain, unable to do because of incompatible MRI.  Repeat CT head without any acute findings. 2D echocardiogram, normal ejection fraction.  Regional wall motion abnormality which is likely chronic.  No intracardiac thrombus. Antiplatelet therapy, on Eliquis at home.  Continued. LDL 83, started on Crestor 40 mg daily. Hemoglobin A1c, 6.  No  indication for treatment. Therapy recommendations , a skilled nursing facility placement. Patient was not a tPA candidate because of unknown duration, thrombectomy was suggested however declined. Significant dysarthria, seen by speech therapy, started on dysphagia 1 diet with honey thick liquid.    Profound dysarthria and dysphagia: Started on dysphagia 1 diet.  On calorie count to ensure adequate intake before discharging.  Essential hypertension blood pressures stable.  Resume antihypertensives today.  Hyperlipidemia: As above.  On Crestor.  Coronary artery disease status post CABG: On statin, currently on Eliquis.  History of A-fib: Sinus rhythm.  On Eliquis.  Has leadless pacemaker due to tachybradycardia syndrome.  Peripheral artery disease: Status post bypass, subsequent bilateral BKA.  On Eliquis and rosuvastatin.  Will monitor oral intake next 24 hours and cleared for discharge to SNF. Also consulted palliative care for goal of care discussions.   DVT prophylaxis: SCD's Start: 09/12/23 1433 apixaban (ELIQUIS) tablet 5 mg   Code Status: Full code Family Communication: None today. Disposition Plan: Status is: Inpatient Remains inpatient appropriate because: Stroke workup, oral intake evaluation.     Consultants:  Neurology Palliative care,  Procedures:  None  Antimicrobials:  None     Objective: Vitals:   09/15/23 1955 09/15/23 2328 09/16/23 0347 09/16/23 0737  BP: (!) 133/93 (!) 154/78 128/65 (!) 142/67  Pulse: (!) 57 (!) 50 (!) 51 61  Resp: 18 18 18    Temp: 98.7 F (37.1 C) 98.4 F (36.9 C) 97.7 F (36.5 C) 98.4 F (36.9 C)  TempSrc: Oral Oral Oral Oral  SpO2: 100% 97% 98% 100%  Weight:      Height:        Intake/Output Summary (Last 24 hours) at  09/16/2023 1041 Last data filed at 09/16/2023 0900 Gross per 24 hour  Intake 190 ml  Output --  Net 190 ml   Filed Weights   09/12/23 1213  Weight: 41 kg    Examination:  General: Profoundly  dysarthric and chronically sick looking.  Comfortable and interactive with writing. Cardiovascular: S1-S2 normal.  Regular rate rhythm. Respiratory: Bilateral clear.  No added sounds. Gastrointestinal: Soft.  Nontender.  Bowel sound present. Ext: No swelling or edema. Neuro: Alert awake and mostly oriented.  Profoundly dysarthric so difficult to interpret but she is able to write with right hand without difficulties.  Left facial droop. She can move right upper extremity and amputated right lower extremity with normal power. Left upper extremity 0/5, left lower extremity that has a bony amputation stump she can move at the hip joint with 3/5.   Data Reviewed: I have personally reviewed following labs and imaging studies  CBC: Recent Labs  Lab 09/12/23 1225 09/12/23 1232  WBC 4.4  --   NEUTROABS 2.4  --   HGB 12.5 13.9  HCT 40.7 41.0  MCV 77.7*  --   PLT 335  --    Basic Metabolic Panel: Recent Labs  Lab 09/12/23 1225 09/12/23 1232  NA 138 139  K 3.9 3.9  CL 107 108  CO2 23  --   GLUCOSE 99 97  BUN 9 10  CREATININE 0.89 0.90  CALCIUM 9.0  --    GFR: Estimated Creatinine Clearance: 38.2 mL/min (by C-G formula based on SCr of 0.9 mg/dL). Liver Function Tests: Recent Labs  Lab 09/12/23 1225  AST 18  ALT 7  ALKPHOS 71  BILITOT 0.7  PROT 7.6  ALBUMIN 2.8*   No results for input(s): "LIPASE", "AMYLASE" in the last 168 hours. No results for input(s): "AMMONIA" in the last 168 hours. Coagulation Profile: Recent Labs  Lab 09/12/23 1225  INR 1.1   Cardiac Enzymes: No results for input(s): "CKTOTAL", "CKMB", "CKMBINDEX", "TROPONINI" in the last 168 hours. BNP (last 3 results) No results for input(s): "PROBNP" in the last 8760 hours. HbA1C: No results for input(s): "HGBA1C" in the last 72 hours.  CBG: Recent Labs  Lab 09/12/23 1215 09/12/23 1223  GLUCAP 98 91   Lipid Profile: No results for input(s): "CHOL", "HDL", "LDLCALC", "TRIG", "CHOLHDL", "LDLDIRECT"  in the last 72 hours.  Thyroid Function Tests: No results for input(s): "TSH", "T4TOTAL", "FREET4", "T3FREE", "THYROIDAB" in the last 72 hours. Anemia Panel: No results for input(s): "VITAMINB12", "FOLATE", "FERRITIN", "TIBC", "IRON", "RETICCTPCT" in the last 72 hours. Sepsis Labs: No results for input(s): "PROCALCITON", "LATICACIDVEN" in the last 168 hours.  Recent Results (from the past 240 hours)  Resp panel by RT-PCR (RSV, Flu A&B, Covid) Anterior Nasal Swab     Status: None   Collection Time: 09/12/23  3:18 PM   Specimen: Anterior Nasal Swab  Result Value Ref Range Status   SARS Coronavirus 2 by RT PCR NEGATIVE NEGATIVE Final   Influenza A by PCR NEGATIVE NEGATIVE Final   Influenza B by PCR NEGATIVE NEGATIVE Final    Comment: (NOTE) The Xpert Xpress SARS-CoV-2/FLU/RSV plus assay is intended as an aid in the diagnosis of influenza from Nasopharyngeal swab specimens and should not be used as a sole basis for treatment. Nasal washings and aspirates are unacceptable for Xpert Xpress SARS-CoV-2/FLU/RSV testing.  Fact Sheet for Patients: BloggerCourse.com  Fact Sheet for Healthcare Providers: SeriousBroker.it  This test is not yet approved or cleared by the [place] FDA  and has been authorized for detection and/or diagnosis of SARS-CoV-2 by FDA under an Emergency Use Authorization (EUA). This EUA will remain in effect (meaning this test can be used) for the duration of the COVID-19 declaration under Section 564(b)(1) of the Act, 21 U.S.C. section 360bbb-3(b)(1), unless the authorization is terminated or revoked.     Resp Syncytial Virus by PCR NEGATIVE NEGATIVE Final    Comment: (NOTE) Fact Sheet for Patients: BloggerCourse.com  Fact Sheet for Healthcare Providers: SeriousBroker.it  This test is not yet approved or cleared by the [place] FDA and has been  authorized for detection and/or diagnosis of SARS-CoV-2 by FDA under an Emergency Use Authorization (EUA). This EUA will remain in effect (meaning this test can be used) for the duration of the COVID-19 declaration under Section 564(b)(1) of the Act, 21 U.S.C. section 360bbb-3(b)(1), unless the authorization is terminated or revoked.  Performed at [facility], [address] [place], [place] [address]          Radiology Studies: No results found.       Scheduled Meds:  apixaban  5 mg Oral BID   ezetimibe  10 mg Oral Daily   fluticasone  2 spray Each Nare Daily   mouth rinse  15 mL Mouth Rinse 4 times per day   rosuvastatin  40 mg Oral Daily   Continuous Infusions:     LOS: 4 days    Time spent: 40 minutes    Dorcas Carrow, MD [facility]

## 2023-09-17 DIAGNOSIS — I63511 Cerebral infarction due to unspecified occlusion or stenosis of right middle cerebral artery: Secondary | ICD-10-CM | POA: Diagnosis not present

## 2023-09-17 MED ORDER — FLUTICASONE PROPIONATE 50 MCG/ACT NA SUSP: 2.0000 | Freq: Every day | NASAL | 2 refills | Status: AC

## 2023-09-17 MED ORDER — OXYCODONE HCL 5 MG PO TABS: 5.0000 mg | ORAL_TABLET | Freq: Four times a day (QID) | ORAL | 0 refills | Status: DC | PRN

## 2023-09-17 MED ORDER — EZETIMIBE 10 MG PO TABS: 10.0000 mg | ORAL_TABLET | Freq: Every day | ORAL | 2 refills | Status: DC

## 2023-09-17 MED ORDER — AMLODIPINE BESYLATE 5 MG PO TABS: 5.0000 mg | ORAL_TABLET | Freq: Every day | ORAL | 11 refills | Status: DC

## 2023-09-17 NOTE — Care Management Important Message (Signed)
Important Message  Patient Details  Name: Mercedes [name] MRN: [id number] Date of Birth: 08/08/1956   Important Message Given:  Yes - Medicare IM  Patient left prior to IM delivery will mail a copy of IM to the patient home address.    Naila Elizondo 09/17/2023, 4:41 PM

## 2023-09-17 NOTE — Plan of Care (Signed)
  Problem: Education: Goal: Knowledge of disease or condition will improve Outcome: Not Progressing   Problem: Education: Goal: Knowledge of secondary prevention will improve (MUST DOCUMENT ALL) Outcome: Not Progressing   Problem: Education: Goal: Knowledge of patient specific risk factors will improve (DELETE if not current risk factor) Outcome: Not Progressing   Problem: Ischemic Stroke/TIA Tissue Perfusion: Goal: Complications of ischemic stroke/TIA will be minimized Outcome: Not Progressing   Problem: Coping: Goal: Will verbalize positive feelings about self Outcome: Not Progressing   Problem: Coping: Goal: Will identify appropriate support needs Outcome: Not Progressing

## 2023-09-17 NOTE — Progress Notes (Signed)
Physical Therapy Treatment Patient Details Name: Mercedes [name] MRN: [id number] DOB: 12-31-1955 Today's Date: 09/17/2023   History of Present Illness Mercedes [name] is a 68 y.o. female who presented with slurred speech, difficulty talking and left-sided facial droop along with left-sided abnormal sensation and left arm weakness. CT revealed infarct core in the right posterior frontal periventricular white matter. PMHx Abnormal LFTs, Anemia, Anxiety, Arnold-Chiari malformation, CAD, DEPRESSION, Dizziness, GERD, Headache, transmetatarsal amputation of left foot, HYPERLIPIDEMIA, HYPERTENSION, MI, PAD, Paroxysmal atrial fibrillation, cardiac pacemaker, RA, Sjogren's syndrome, SLE, Stroke, Tachy-brady syndrome, Takotsubo cardiomyopathy, bilateral AKA    PT Comments  Patient able to follow simple commands and communicate with writing on white board. Worked on supine to sit with using RUE and rt rail. Pt able to progress to CGA sitting balance using RUE. Worked on both lateral scoot and posterior scoot with pt requiring max to total assist and use of bed pad under pelvis to assist. Discussed her desire to go home and will need hoyer lift for OOB to power chair. She asked me to call her sister, Mercedes [name], re: discharge plan. Mercedes [name] reports pt has an aide 8 hrs/day (pt had said 2 hrs) and family will provide 24/7 care. Explained need for medical transport and hoyer lift (which requires 2 people to safely use) due to pt's inability to transfer at this time. Explained lesser time with therapies from [facility] compared to SNF and sister verbalized understanding and continues to want pt to go home with family to help with therapies on the days HH does not come. Confirmed/updated DME they currently have at home. Based on patient and family request, updated discharge plan to HHPT with hoyer lift for home. Will need medical transport home.     If plan is discharge home, recommend the following: Two people to help with  walking and/or transfers;Direct supervision/assist for medications management;Direct supervision/assist for financial management;Assist for transportation;Help with stairs or ramp for entrance;Supervision due to cognitive status   Can travel by private vehicle     No  Equipment Recommendations  Hoyer lift    Recommendations for Other Services       Precautions / Restrictions Precautions Precautions: Fall Restrictions Weight Bearing Restrictions Per Provider Order: No     Mobility  Bed Mobility Overal bed mobility: Needs Assistance Bed Mobility: Sidelying to Sit, Sit to Supine (Pt required TOTAL A +2 for safety)   Sidelying to sit: Mod assist, HOB elevated, Used rails   Sit to supine: Contact guard assist, HOB elevated, Used rails   General bed mobility comments: Initially using RUE to push herself up to sitting from rt side, but then stops and falls back onto her right side; eventually able to support  herself in sitting with RUE support with CGA for several minutes at a time    Transfers Overall transfer level: Needs assistance Equipment used:  (RUE on bed vs on rail) Transfers: Bed to chair/wheelchair/BSC         Anterior-Posterior transfers: Max assist  Lateral/Scoot Transfers: Total assist General transfer comment: attempted scooting laterally to her rt  using RUE but needs total assist and use of bed pad underneath her; educated in posterior transfer with using RUE and pt able to pull her rt hip posteriorly and needs max assist to bring left side back    Ambulation/Gait                   Stairs  Wheelchair Mobility     Tilt Bed    Modified Rankin (Stroke Patients Only) Modified Rankin (Stroke Patients Only) Pre-Morbid Rankin Score: Moderately severe disability Modified Rankin: Severe disability     Balance Overall balance assessment: Needs assistance Sitting-balance support: Single extremity supported Sitting balance-Leahy  Scale: Poor Sitting balance - Comments: progressed from falling to her rt to sitting with RUE and CGA Postural control: Right lateral Mercedes                                  Cognition Arousal: Alert Behavior During Therapy: Impulsive Overall Cognitive Status: Impaired/Different from baseline Area of Impairment: Awareness, Safety/judgement, Problem solving                         Safety/Judgement: Decreased awareness of deficits, Decreased awareness of safety Awareness: Intellectual Problem Solving: Requires verbal cues, Difficulty sequencing, Slow processing, Requires tactile cues General Comments: per sister, she has an aide 8 hrs/day (pt has repeatedly said 2 hrs/day); pt with difficulty figuring out how to come to sit at EOB or to "walk on her hips" backwards        Exercises      General Comments General comments (skin integrity, edema, etc.): Pt communicating with yes/no questions and writing on white board. Asks me to call her sister, Mercedes [name], re: going home instead of SNF      Pertinent Vitals/Pain Pain Assessment Pain Assessment: No/denies pain    Home Living                   Home Equipment: Tub bench;Wheelchair - power;Hospital bed;Wheelchair - manual;Grab bars - toilet;Grab bars - tub/shower;Hand held shower head      Prior Function            PT Goals (current goals can now be found in the care plan section) Acute Rehab PT Goals Patient Stated Goal: go home Time For Goal Achievement: 09/27/23 Potential to Achieve Goals: Fair Progress towards PT goals: Progressing toward goals    Frequency    Min 3X/week      PT Plan      Co-evaluation              AM-PAC PT "6 Clicks" Mobility   Outcome Measure  Help needed turning from your back to your side while in a flat bed without using bedrails?: A Little Help needed moving from lying on your back to sitting on the side of a flat bed without using bedrails?: A  Lot Help needed moving to and from a bed to a chair (including a wheelchair)?: Total Help needed standing up from a chair using your arms (e.g., wheelchair or bedside chair)?: Total Help needed to walk in hospital room?: Total Help needed climbing 3-5 steps with a railing? : Total 6 Click Score: 9    End of Session   Activity Tolerance: Patient tolerated treatment well Patient left: in bed;with call bell/phone within reach;with bed alarm set;Other (comment) (Palliative NP) Nurse Communication: Other (comment) (change in plan to home) PT Visit Diagnosis: Hemiplegia and hemiparesis;Other symptoms and signs involving the nervous system (R29.898) Hemiplegia - Right/Left: Left Hemiplegia - caused by: Cerebral infarction     Time: 0981-1914 PT Time Calculation (min) (ACUTE ONLY): 19 min  Charges:    $Therapeutic Activity: 8-22 mins PT General Charges $$ ACUTE PT VISIT: 1 Visit  Jerolyn Center, PT Acute Rehabilitation Services  Office [phone]    Zena Amos 09/17/2023, 10:33 AM

## 2023-09-17 NOTE — TOC Transition Note (Addendum)
Transition of Care [facility]) - Discharge Note   Patient Details  Name: Mercedes [name] MRN: [id number] Date of Birth: 1955-10-13  Transition of Care [facility]) CM/SW Contact:  Mercedes Lenis, LCSW Phone Number: 09/17/2023, 11:20 AM   Clinical Narrative:   CSW updated by medical team that family had met over the weekend and will take the patient home with home health instead of SNF. CSW spoke with daughter, Mercedes [name], about home health preference. Patient has used [facility] in the past, would prefer to use them again. CSW sent referral to [facility], it was accepted. Information placed on AVS.  CSW discussed with Mercedes [name] about transportation home, and recommendation for PTAR. Mercedes [name] said she'll ask the patient when she arrives to the hospital, but she will likely refuse and family will provide transport home. CSW updated RN.   No further TOC needs at this time.  UPDATE: CSW met with daughter, Mercedes [name], at bedside to discuss recommendation for hoyer lift. Patient unsure her DME provider, thought possibly [employer]. CSW contacted [employer], they do not have the patient in the system. CSW contacted [employer], and patient's DME is provided through [employer]. CSW requested hoyer lift to be delivered to the patient's home. CSW confirmed with patient and daughter that patient does not want to transport home via [place], family will transport.     Final next level of care: Home w Home Health Services Barriers to Discharge: Barriers Resolved   Patient Goals and CMS Choice Patient states their goals for this hospitalization and ongoing recovery are:: Wants to get a bath CMS Medicare.gov Compare Post Acute Care list provided to:: Patient Represenative (must comment) Choice offered to / list presented to : Sibling [place] ownership interest in [facility]provided to:: Sibling    Discharge Placement                Patient to be transferred to facility by: Family Name of family member notified: Mercedes [name] Patient and family  notified of of transfer: 09/17/23  Discharge Plan and Services Additional resources added to the After Visit Summary for       Post Acute Care Choice: Skilled Nursing Facility                    HH Arranged: PT, OT, Speech Therapy HH Agency: [facility] Date [facility] Agency Contacted: 09/17/23   Representative spoke with at [facility] Agency: Mercedes [name]  Social Drivers of Health (SDOH) Interventions SDOH Screenings   Food Insecurity: Patient Declined (09/13/2023)  Housing: Patient Declined (09/13/2023)  Transportation Needs: No Transportation Needs (09/13/2023)  Utilities: Patient Declined (09/13/2023)  Alcohol Screen: Low Risk  (07/15/2021)  Depression (PHQ2-9): Low Risk  (11/08/2021)  Financial Resource Strain: Low Risk  (07/15/2021)  Physical Activity: Inactive (07/15/2021)  Social Connections: Patient Declined (09/13/2023)  Stress: No Stress Concern Present (07/15/2021)  Tobacco Use: Medium Risk (09/12/2023)     Readmission Risk Interventions    03/17/2022   10:38 AM  Readmission Risk Prevention Plan  Post Dischage Appt Complete  Medication Screening Complete  Transportation Screening Complete

## 2023-09-17 NOTE — Discharge Instructions (Signed)

## 2023-09-17 NOTE — Discharge Summary (Signed)
Physician Discharge Summary  Mercedes [name] ZOX:[id number] DOB: 1956/01/18 DOA: 09/12/2023  PCP: Pincus Sanes, MD  Admit date: 09/12/2023 Discharge date: 09/17/2023  Admitted From: Home Disposition: Home with home health  Recommendations for Outpatient Follow-up:  Follow up with PCP in 1-2 weeks  Home Health: PT/OT/speech Equipment/Devices: Michiel Sites lift  Discharge Condition: Fair CODE STATUS: Full code Diet recommendation: Dysphagia 1 diet, pure and honey thick liquids.  All-time aspiration precautions.  Discharge summary: 68 year old female with extensive medical history including history of stroke, hypertension, hyperlipidemia, coronary artery disease status post CABG, history of Takotsubo cardiomyopathy, peripheral artery disease status post bypass and now with bilateral above-knee amputation, atrial fibrillation, tachybradycardia syndrome status post pacemaker, type 2 diabetes, Sjogren's syndrome, seizure disorder, hypothyroidism and GERD presented from home with focal neurological deficits.  Patient lives at home and reported falling out of chair 1 day prior to admit.  In the morning, she spoke with her granddaughter who noted unintelligible speech, granddaughter came home to see her and found with profound weakness and left-sided facial droop and dysarthria so EMS was called.  Patient was brought to the emergency room as code stroke.  Thrombectomy was suggested for right M2, after multiple discussions this was not carried out.  Admitted for supportive care and further stroke workup. Patient does have baseline left-sided hemiplegia. Patient remained in the hospital looking for rehab placements, however she is determined to go home as she has adequate support system at home.  # Acute right MCA territory stroke: Clinical findings, chronic left hemiplegia, new facial droop.  Dysphagia and severe dysarthria.   CTA head and neck, occlusion of right M2 and A2 with infarct. MRI of the  brain, unable to do because of incompatible MRI.  Repeat CT head without any acute findings. 2D echocardiogram, normal ejection fraction.  Regional wall motion abnormality which is likely chronic.  No intracardiac thrombus. Antiplatelet therapy, on Eliquis at home.  Continued. LDL 83, started on Crestor 40 mg and Zetia 10 mg. Hemoglobin A1c, 6.  No indication for treatment. Therapy recommendations , a skilled nursing facility placement.  Now wants to go home. Patient was not a tPA candidate because of unknown duration, thrombectomy was suggested however declined. Significant dysarthria, seen by speech therapy, started on dysphagia 1 diet with honey thick liquid.     Profound dysarthria and dysphagia: Started on dysphagia 1 diet.  Taking adequate pured diet and honey thick liquids.   Essential hypertension blood pressures stable.  On nifedipine XL 30 mg at home, unable to crush this medicine.  Will change to amlodipine 5 mg daily.   Hyperlipidemia: As above.  On Crestor.  Starting Zetia.   Coronary artery disease status post CABG: On statin, currently on Eliquis.   History of A-fib: Sinus rhythm.  On Eliquis.  Has leadless pacemaker due to tachybradycardia syndrome.   Peripheral artery disease: Status post bypass, subsequent bilateral BKA.  On Eliquis and rosuvastatin.  Disposition: Patient with left hemiplegia, bilateral above-knee amputation, previously wheelchair-bound.  Patient and family decided to go home as she has adequate personal care support /family support.  Will maximize home health therapies.  PT/OT/speech.  Recommended strict swallow precautions, dietary modifications. Stable for discharge.  Patient is still complaining of intermittent headache, improved with oxycodone.  She will use Tylenol for mild pain, will provide short course of oxycodone therapy for moderate to severe pain.      Discharge Diagnoses:  Principal Problem:   Acute ischemic right MCA stroke  (HCC) Active  Problems:   Dyslipidemia   Essential hypertension   Tachycardia-bradycardia (HCC)   Rheumatoid arthritis (HCC)   SLE (systemic lupus erythematosus) (HCC)   CAD - S/P Takotsubo MI in 2000, CFX DES Sept 2014   PAD (peripheral artery disease) (HCC)   Seizure (HCC)   Paroxysmal atrial fibrillation (HCC)   Hx of CABG   Hyperthyroidism   Diabetes (HCC)   S/P AKA (above knee amputation), left (HCC), 10/01/19   S/P AKA (above knee amputation), right (HCC), 11/23/20   History of stroke   Sjogren's disease (HCC)   Acute CVA (cerebrovascular accident) [facility])    Discharge Instructions  Discharge Instructions     Diet - low sodium heart healthy   Complete by: As directed    Dysphagia 1 diet , medicines crushed. Honey thick liquids   Increase activity slowly   Complete by: As directed       Allergies as of 09/17/2023       Reactions   Brilinta [ticagrelor] Anaphylaxis, Shortness Of Breath, Other (See Comments)   Also, chest tightness   Latex Rash   Tape Rash        Medication List     STOP taking these medications    NIFEdipine 30 MG 24 hr tablet Commonly known as: ADALAT CC       TAKE these medications    acetaminophen 500 MG tablet Commonly known as: TYLENOL Take 500-1,000 mg by mouth every 6 (six) hours as needed for mild pain (pain score 1-3) or moderate pain (pain score 4-6).   amLODipine 5 MG tablet Commonly known as: NORVASC Take 1 tablet (5 mg total) by mouth daily.   Eliquis 5 MG Tabs tablet Generic drug: apixaban TAKE 1 TABLET BY MOUTH TWICE  DAILY   ezetimibe 10 MG tablet Commonly known as: ZETIA Take 1 tablet (10 mg total) by mouth daily. Start taking on: September 18, 2023   fluticasone 50 MCG/ACT nasal spray Commonly known as: FLONASE Place 2 sprays into both nostrils daily. Start taking on: September 18, 2023   mupirocin ointment 2 % Commonly known as: BACTROBAN Apply 1 Application topically 2 (two) times daily.   nitroGLYCERIN  0.4 MG SL tablet Commonly known as: NITROSTAT Place 1 tablet (0.4 mg total) under the tongue every 5 (five) minutes as needed for chest pain.   oxyCODONE 5 MG immediate release tablet Commonly known as: Oxy IR/ROXICODONE Take 1 tablet (5 mg total) by mouth every 6 (six) hours as needed for severe pain (pain score 7-10) or moderate pain (pain score 4-6).   polyethylene glycol 17 g packet Commonly known as: MIRALAX / GLYCOLAX Take 17 g by mouth daily.   rosuvastatin 40 MG tablet Commonly known as: CRESTOR Take 1 tablet (40 mg total) by mouth daily.   senna-docusate 8.6-50 MG tablet Commonly known as: Senokot-S Take 1 tablet by mouth at bedtime as needed for mild constipation.        Allergies  Allergen Reactions   Brilinta [Ticagrelor] Anaphylaxis, Shortness Of Breath and Other (See Comments)    Also, chest tightness   Latex Rash   Tape Rash    Consultations: Neurology Palliative care   Procedures/Studies: CT HEAD WO CONTRAST ( ) Result Date: 09/13/2023 CLINICAL DATA:  Stroke, follow up EXAM: CT HEAD WITHOUT CONTRAST TECHNIQUE: Contiguous axial images were obtained from the base of the skull through the vertex without intravenous contrast. RADIATION DOSE REDUCTION: This exam was performed according to the departmental dose-optimization program which includes automated exposure control,  adjustment of the mA and/or kV according to patient size and/or use of iterative reconstruction technique. COMPARISON:  CT head 09/12/2023. FINDINGS: Brain: Motion limited study with new areas of hypoattenuation and loss of gray differentiation in the posterior right MCA territory including the parietal and temporal lobes, compatible with for acute infarct. No evidence of mass occupying acute hemorrhage, mass lesion or hydrocephalus. Vascular: Not well evaluated due to motion. Skull: No acute fracture. Sinuses/Orbits: Clear sinuses.  No acute findings. Other: No mastoid effusions. IMPRESSION:  Motion limited study with new areas of acute infarct in the posterior right MCA territory. No significant mass effect or midline shift. Electronically Signed   By: Feliberto Harts M.D.   On: 09/13/2023 20:58   ECHOCARDIOGRAM COMPLETE Result Date: 09/13/2023    ECHOCARDIOGRAM REPORT   Patient Name:   Mercedes [name] Date of Exam: 09/13/2023 Medical Rec #:  [id number]         Height:       65.0 in Accession #:    [id number]        Weight:       90.4 lb Date of Birth:  September 02, 1955         BSA:          1.411 m Patient Age:    67 years          BP:           171/99 mmHg Patient Gender: F                 HR:           60 bpm. Exam Location:  Inpatient Procedure: 2D Echo, Cardiac Doppler and Color Doppler Indications:    Stroke  History:        Patient has prior history of Echocardiogram examinations, most                 recent 08/29/2022. Risk Factors:Hypertension, Diabetes,                 Dyslipidemia and Former Smoker.  Sonographer:    Karma Ganja Referring Phys: [id number] Cecille Po MELVIN  Sonographer Comments: Technically challenging study due to limited acoustic windows. IMPRESSIONS  1. Left ventricular ejection fraction, by estimation, is 55 to 60%. The left ventricle has normal function. The left ventricle demonstrates regional wall motion abnormalities (see scoring diagram/findings for description). There is moderate asymmetric left ventricular hypertrophy of the basal-septal segment. Left ventricular diastolic parameters are consistent with Grade I diastolic dysfunction (impaired relaxation).  2. Right ventricular systolic function is normal. The right ventricular size is normal.  3. The mitral valve is normal in structure. Trivial mitral valve regurgitation. No evidence of mitral stenosis.  4. The aortic valve is normal in structure. There is mild calcification of the aortic valve. Aortic valve regurgitation is not visualized. Aortic valve sclerosis/calcification is present, without any evidence of aortic  stenosis.  5. The inferior vena cava is normal in size with greater than 50% respiratory variability, suggesting right atrial pressure of 3 mmHg. Conclusion(s)/Recommendation(s): Overall EF appears normal. Likely subtle inferoapical hypokinesis. FINDINGS  Left Ventricle: Left ventricular ejection fraction, by estimation, is 55 to 60%. The left ventricle has normal function. The left ventricle demonstrates regional wall motion abnormalities. The left ventricular internal cavity size was normal in size. There is moderate asymmetric left ventricular hypertrophy of the basal-septal segment. Left ventricular diastolic parameters are consistent with Grade I diastolic dysfunction (impaired relaxation).  LV Wall Scoring: The  apical inferior segment is hypokinetic. Right Ventricle: The right ventricular size is normal. No increase in right ventricular wall thickness. Right ventricular systolic function is normal. Left Atrium: Left atrial size was normal in size. Right Atrium: Right atrial size was normal in size. Pericardium: There is no evidence of pericardial effusion. Mitral Valve: The mitral valve is normal in structure. Trivial mitral valve regurgitation. No evidence of mitral valve stenosis. MV peak gradient, 2.5 mmHg. The mean mitral valve gradient is 1.0 mmHg. Tricuspid Valve: The tricuspid valve is normal in structure. Tricuspid valve regurgitation is not demonstrated. No evidence of tricuspid stenosis. Aortic Valve: The aortic valve is normal in structure. There is mild calcification of the aortic valve. Aortic valve regurgitation is not visualized. Aortic valve sclerosis/calcification is present, without any evidence of aortic stenosis. Aortic valve mean gradient measures 2.0 mmHg. Aortic valve peak gradient measures 3.7 mmHg. Aortic valve area, by VTI measures 2.77 cm. Pulmonic Valve: The pulmonic valve was normal in structure. Pulmonic valve regurgitation is not visualized. No evidence of pulmonic stenosis.  Aorta: The aortic root is normal in size and structure. Venous: The inferior vena cava is normal in size with greater than 50% respiratory variability, suggesting right atrial pressure of 3 mmHg. IAS/Shunts: No atrial level shunt detected by color flow Doppler.  LEFT VENTRICLE PLAX 2D LVIDd:         4.30 cm   Diastology LVIDs:         3.30 cm   LV e' medial:    5.22 cm/s LV PW:         1.10 cm   LV E/e' medial:  9.3 LV IVS:        1.70 cm   LV e' lateral:   6.20 cm/s LVOT diam:     2.00 cm   LV E/e' lateral: 7.8 LV SV:         49 LV SV Index:   35 LVOT Area:     3.14 cm  RIGHT VENTRICLE            IVC RV Basal diam:  3.00 cm    IVC diam: 1.20 cm RV S prime:     8.49 cm/s TAPSE (M-mode): 1.2 cm LEFT ATRIUM             Index        RIGHT ATRIUM           Index LA diam:        3.90 cm 2.76 cm/m   RA Area:     12.60 cm LA Vol (A2C):   48.8 ml 34.57 ml/m  RA Volume:   31.70 ml  22.46 ml/m LA Vol (A4C):   18.1 ml 12.82 ml/m LA Biplane Vol: 29.5 ml 20.90 ml/m  AORTIC VALVE AV Area (Vmax):    2.84 cm AV Area (Vmean):   2.52 cm AV Area (VTI):     2.77 cm AV Vmax:           96.10 cm/s AV Vmean:          58.500 cm/s AV VTI:            0.177 m AV Peak Grad:      3.7 mmHg AV Mean Grad:      2.0 mmHg LVOT Vmax:         86.80 cm/s LVOT Vmean:        47.000 cm/s LVOT VTI:          0.156 m  LVOT/AV VTI ratio: 0.88  AORTA Ao Root diam: 3.40 cm MITRAL VALVE MV Area (PHT): 3.60 cm    SHUNTS MV Area VTI:   2.31 cm    Systemic VTI:  0.16 m MV Peak grad:  2.5 mmHg    Systemic Diam: 2.00 cm MV Mean grad:  1.0 mmHg MV Vmax:       0.79 m/s MV Vmean:      49.0 cm/s MV Decel Time: 211 msec MV E velocity: 48.40 cm/s MV A velocity: 60.00 cm/s MV E/A ratio:  0.81 Arvilla Meres MD Electronically signed by Arvilla Meres MD Signature Date/Time: 09/13/2023/11:06:01 AM    Final    DG Swallowing Func-Speech Pathology Result Date: 09/13/2023 Table formatting from the original result was not included. Modified Barium Swallow Study  Patient Details Name: Mercedes [name] MRN: [id number] Date of Birth: Jan 31, 1956 Today's Date: 09/13/2023 HPI/PMH: HPI: Mercedes [name] is a 68 yo female presenting to ED 1/15 with slurred speech, L facial droop, and L sided weakness/numbness. CTA shows R MCA and ACA occlusions and infarct core in R posterior frontal periventricular white matter with penumbra throughout the posterior R MCA. Was recommended to undergo emergent thrombectomy but pt and her family refused. Previously seen by SLP January 2024 with oropharyngeal dysphagia and cognitive deficits. MBS 09/13/22 showed continued aspiration of thin liquids and recommended a Dys 2 diet with nectar thick liquid. PMH includes anemia, anxiety, CAD, GERD, HLD, HTN, MI, PAD, paroxysmal A-fib, RA, Sjogren's syndrome, previous CVA (R ICA 08/2022) with significant residual L sided weakness, tachy-brady syndrome, bilateral BKA Clinical Impression: Clinical Impression: Imaging was not recorded so this study is documented based on SLPs initial interpretation in real time. Pt demonstrates a primary oral dysphagia with significant left CN VII and CN XII weakness. Pt unable to achieve volitional lingual movement, but uses secondary movement, rocking, posterior tilt and face wiping to elicit sensation and subsequent movement. Her impairments are again severe, but seem to be similar in nature to prior admissions and strokes, so she is quickly using strategies that have worked in the past. Pt has anterior loss and oral residue with liquids and thin and nectar thick liquids are aspirated before/during the swallow due to slightly late airway closure. Controlling bolus size with teaspoon is again most effective. There is no pharyngeal residue. When given honey and puree teaspoon pt has a more cohesive oral phase and there is no penetraiton or aspiration. Pt seems to improve as study progressess. Will benefit from initaiting a conservative diet for practice puree/honey but has good  potential to upgrade as she has done in the past. She is unhappy about purees and wants more texture in the future, but during the study, increased texture significantly delayed oral transit and increased oral residue. Factors that may increase risk of adverse event in presence of aspiration Rubye Oaks & Clearance Coots 2021): No data recorded Recommendations/Plan: Swallowing Evaluation Recommendations Swallowing Evaluation Recommendations Recommendations: PO diet PO Diet Recommendation: Dysphagia 1 (Pureed); Moderately thick liquids (Level 3, honey thick) Liquid Administration via: Spoon Medication Administration: Crushed with puree Supervision: Staff to assist with self-feeding Swallowing strategies  : Slow rate; Small bites/sips; Check for pocketing or oral holding; Check for anterior loss Postural changes: Position pt fully upright for meals; Stay upright 30-60 min after meals Oral care recommendations: Oral care before PO Caregiver Recommendations: Have oral suction available Treatment Plan Treatment Plan Treatment recommendations: Therapy as outlined in treatment plan below Follow-up recommendations: Home health SLP Functional status assessment: Patient  has had a recent decline in their functional status and demonstrates the ability to make significant improvements in function in a reasonable and predictable amount of time. Treatment frequency: Min 2x/week Treatment duration: 2 weeks Interventions: Patient/family education; Aspiration precaution training; Trials of upgraded texture/liquids; Diet toleration management by SLP Recommendations Recommendations for follow up therapy are one component of a multi-disciplinary discharge planning process, led by the attending physician.  Recommendations may be updated based on patient status, additional functional criteria and insurance authorization. Assessment: Orofacial Exam: Orofacial Exam Oral Cavity: Oral Hygiene: Pooled secretions Oral Cavity - Dentition: Adequate natural  dentition Orofacial Anatomy: WFL Oral Motor/Sensory Function: Suspected cranial nerve impairment CN V - Trigeminal: WFL CN VII - Facial: Left motor impairment CN XII - Hypoglossal: Left motor impairment; Right motor impairment Anatomy: Anatomy: WFL Boluses Administered: Boluses Administered Boluses Administered: Thin liquids (Level 0); Mildly thick liquids (Level 2, nectar thick); Moderately thick liquids (Level 3, honey thick); Puree  Oral Impairment Domain: Oral Impairment Domain Lip Closure: Escape beyond mid-chin Tongue control during bolus hold: Cohesive bolus between tongue to palatal seal Bolus preparation/mastication: Minimal chewing/mashing with majority of bolus unchewed Bolus transport/lingual motion: Minimal-no tongue motion Oral residue: Residue collection on oral structures Location of oral residue : Tongue; Palate Initiation of pharyngeal swallow : Pyriform sinuses  Pharyngeal Impairment Domain: Pharyngeal Impairment Domain Soft palate elevation: No bolus between soft palate (SP)/pharyngeal wall (PW) Laryngeal elevation: Complete superior movement of thyroid cartilage with complete approximation of arytenoids to epiglottic petiole Anterior hyoid excursion: Complete anterior movement Epiglottic movement: Complete inversion Laryngeal vestibule closure: Complete, no air/contrast in laryngeal vestibule Pharyngeal stripping wave : Present - complete Pharyngoesophageal segment opening: Complete distension and complete duration, no obstruction of flow Tongue base retraction: No contrast between tongue base and posterior pharyngeal wall (PPW) Pharyngeal residue: Complete pharyngeal clearance  Esophageal Impairment Domain: No data recorded Pill: No data recorded Penetration/Aspiration Scale Score: Penetration/Aspiration Scale Score 1.  Material does not enter airway: Thin liquids (Level 0); Mildly thick liquids (Level 2, nectar thick); Moderately thick liquids (Level 3, honey thick); Puree 4.  Material enters  airway, CONTACTS cords then ejected out: Mildly thick liquids (Level 2, nectar thick); Thin liquids (Level 0) 8.  Material enters airway, passes BELOW cords without attempt by patient to eject out (silent aspiration) : Thin liquids (Level 0); Mildly thick liquids (Level 2, nectar thick) Compensatory Strategies: Compensatory Strategies Compensatory strategies: No   General Information: Caregiver present: No  Diet Prior to this Study: NPO   Temperature : Normal   Respiratory Status: WFL   Supplemental O2: None (Room air)   History of Recent Intubation: No  Behavior/Cognition: Alert; Cooperative Self-Feeding Abilities: Able to self-feed Baseline vocal quality/speech: Other (comment) (wet) Volitional Cough: Able to elicit Volitional Swallow: Able to elicit Exam Limitations: Excessive movement Goal Planning: Prognosis for improved oropharyngeal function: Good No data recorded No data recorded Patient/Family Stated Goal: coffee Consulted and agree with results and recommendations: Patient Pain: Pain Assessment Pain Assessment: No/denies pain End of Session: Start Time:SLP Start Time (ACUTE ONLY): 0935 Stop Time: SLP Stop Time (ACUTE ONLY): 0956 Time Calculation:SLP Time Calculation (min) (ACUTE ONLY): 21 min Charges: SLP Evaluations $ SLP Speech Visit: 1 Visit SLP Evaluations $BSS Swallow: 1 Procedure $MBS Swallow: 1 Procedure $Swallowing Treatment: 1 Procedure SLP visit diagnosis: SLP Visit Diagnosis: Dysphagia, oral phase (R13.11) Past Medical History: Past Medical History: Diagnosis Date  Abnormal LFTs   a. in the past when on Lipitor  Acute ischemic right ICA  stroke (HCC) 08/28/2022  Anemia   as a young woman  Anxiety   Arnold-Chiari malformation, type I (HCC)   MRI brain 02/2010  Atrial flutter with rapid ventricular response vs. SVT 10/27/2014  CAD (coronary artery disease)   a. NSTEMI 07/2009: LHC - D2 40%, LAD irreg., EF 50% with apical AK (tako-tsubo CM);  b. Inf STEMI (04/2013): Tx Promus DES to CFX;  c. 08/2013  Lexi CL: No ischemia, dist ant/ap/inf-ap infarct, EF  d. 06/2016 NSTEMI with LM disease: s/p CABG on 07/17/2016 w/ LIMA-LAD, SVG-OM, and SVG-dRCA.  DEPRESSION   Dizziness   ? 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 anticoagulated with Eliquis 10/2014  Presence of permanent cardiac pacemaker 10/2014  leadless permanent pacemaker  RA (rheumatoid arthritis) (HCC)   prior tx by Dr. Dareen Piano  Sjogren's syndrome (HCC) 06/02/2013  pt denies this (12/01/13)  SLE (systemic lupus erythematosus) (HCC) dx 05/2013  follows with rheum Dareen Piano)  Stroke [facility])   March 2023, weakness in right arm is improving  Tachy-brady syndrome (HCC)   a. 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  f/u echo 09/2009: EF 50-55%, mild LVH, mod diast dysfxn, mild apical HK  Wears dentures   Wears glasses  Past Surgical History: Past Surgical History: Procedure Laterality Date  ABDOMINAL AORTAGRAM N/A 11/24/2013  Procedure: ABDOMINAL AORTAGRAM WITH BILATERAL RUNOFF WITH POSSIBLE INTERVENTION;  Surgeon: Chuck Hint, MD;  Location: [facility] CATH LAB;  Service: Cardiovascular;  Laterality: N/A;  ABDOMINAL AORTOGRAM W/LOWER EXTREMITY N/A 04/25/2019  Procedure: ABDOMINAL AORTOGRAM W/LOWER EXTREMITY;  Surgeon: Sherren Kerns, MD;  Location: [facility] INVASIVE CV LAB;  Service: Cardiovascular;  Laterality: N/A;  ABDOMINAL AORTOGRAM W/LOWER EXTREMITY Bilateral 05/15/2019  Procedure: ABDOMINAL AORTOGRAM W/LOWER EXTREMITY;  Surgeon: Nada Libman, MD;  Location: [facility] INVASIVE CV LAB;  Service: Cardiovascular;  Laterality: Bilateral;  ABDOMINAL AORTOGRAM W/LOWER  EXTREMITY N/A 08/23/2020  Procedure: ABDOMINAL AORTOGRAM W/LOWER EXTREMITY;  Surgeon: Maeola Harman, MD;  Location: [facility] INVASIVE CV LAB;  Service: Cardiovascular;  Laterality: N/A;  ABDOMINAL AORTOGRAM W/LOWER EXTREMITY N/A 10/29/2020  Procedure: ABDOMINAL AORTOGRAM W/LOWER EXTREMITY;  Surgeon: Sherren Kerns, MD;  Location: [facility] INVASIVE CV LAB;  Service: Cardiovascular;  Laterality: N/A;  AMPUTATION Left 12/02/2013  Procedure: LEFT 1ST TOE AMPUTATION;  Surgeon: Sherren Kerns, MD;  Location: [facility] OR;  Service: Vascular;  Laterality: Left;  AMPUTATION Left 07/16/2019  Procedure: LEFT TRANSMETATARSAL AMPUTATION;  Surgeon: Nadara Mustard, MD;  Location: [facility] OR;  Service: Orthopedics;  Laterality: Left;  AMPUTATION Left 10/01/2019  Procedure: LEFT AMPUTATION ABOVE KNEE;  Surgeon: Sherren Kerns, MD;  Location: [facility] OR;  Service: Vascular;  Laterality: Left;  AMPUTATION Right 08/31/2020  Procedure: Right first toe amputation;  Surgeon: Sherren Kerns, MD;  Location: [facility] OR;  Service: Vascular;  Laterality: Right;  AMPUTATION Right 11/23/2020  Procedure: RIGHT AMPUTATION ABOVE KNEE;  Surgeon: Sherren Kerns, MD;  Location: [facility] OR;  Service: Vascular;  Laterality: Right;  AMPUTATION Left 03/14/2022  Procedure: REVISION LEFT ABOVE KNEE AMPUTATION;  Surgeon: Maeola Harman, MD;  Location: [facility] OR;  Service: Vascular;  Laterality:  Left;  APPLICATION OF WOUND VAC Left 11/03/2019  Procedure: Application Of Wound Vac, left lower extremity;  Surgeon: Sherren Kerns, MD;  Location: [facility] OR;  Service: Vascular;  Laterality: Left;  CARDIAC CATHETERIZATION N/A 07/12/2016  Procedure: Left Heart Cath and Coronary Angiography;  Surgeon: Lennette Bihari, MD;  Location: [facility] INVASIVE CV LAB;  Service: Cardiovascular;  Laterality: N/A;  CARDIAC CATHETERIZATION N/A 07/14/2016  Procedure: Intravascular Ultrasound/IVUS;  Surgeon: Yvonne Kendall, MD;  Location: [facility] INVASIVE CV LAB;  Service: Cardiovascular;  Laterality: N/A;   COLONOSCOPY    CORONARY ANGIOPLASTY  04/2013  CORONARY ARTERY BYPASS GRAFT N/A 07/17/2016  Procedure: CORONARY ARTERY BYPASS GRAFTING (CABG)x3 using right greater saphenous vein harvested endoscopiclly and left internal mammary artery;  Surgeon: Kerin Perna, MD;  Location: [facility] OR;  Service: Open Heart Surgery;  Laterality: N/A;  ENDARTERECTOMY FEMORAL Left 05/12/2019  Procedure: LEFT Femoral Endarterectomy;  Surgeon: Sherren Kerns, MD;  Location: [facility] OR;  Service: Vascular;  Laterality: Left;  FEMORAL-POPLITEAL BYPASS GRAFT Left 12/02/2013  Procedure:  LEFT FEMORAL-BELOW KNEE POPLITEAL ARTERY BYPASS GRAFT;  Surgeon: Sherren Kerns, MD;  Location: [facility] OR;  Service: Vascular;  Laterality: Left;  FEMORAL-POPLITEAL BYPASS GRAFT Left 09/25/2019  Procedure: THROMBECTOMY and PROPATEN BYPASS  FEMORAL TO BELOW KNEE POPLITEAL ARTERY BYPASS GRAFT LEFT LEG;  Surgeon: Larina Earthly, MD;  Location: [facility] OR;  Service: Vascular;  Laterality: Left;  FEMORAL-POPLITEAL BYPASS GRAFT Right 08/24/2020  Procedure: RIGHT FEMORAL-BELOW KNEE POPLITEAL ARTERY BYPASS USING GORE PROPATEN GRAFT;  Surgeon: Maeola Harman, MD;  Location: [facility] OR;  Service: Vascular;  Laterality: Right;  FEMORAL-POPLITEAL BYPASS GRAFT Right 11/01/2020  Procedure: Thrombectomy and Revision of Right Femoral to below knee Bypass with Interpostional graft to Tibial / peroneal Trunk and Endarterectomy Tibial Weston Settle Trunk.;  Surgeon: Sherren Kerns, MD;  Location: [facility] OR;  Service: Vascular;  Laterality: Right;  INTRAOPERATIVE ARTERIOGRAM Right 11/01/2020  Procedure: ANTEGRADE RIGHT LOWER EXTREMITY INTRA OPERATIVE ARTERIOGRAM;  Surgeon: Sherren Kerns, MD;  Location: [facility] OR;  Service: Vascular;  Laterality: Right;  IR CT HEAD LTD  08/28/2022  IR PERCUTANEOUS ART THROMBECTOMY/INFUSION INTRACRANIAL INC DIAG ANGIO  08/28/2022  IR US GUIDE VASC ACCESS RIGHT  08/28/2022  LEFT HEART CATHETERIZATION WITH CORONARY ANGIOGRAM N/A 05/19/2013  Procedure: LEFT HEART  CATHETERIZATION WITH CORONARY ANGIOGRAM;  Surgeon: Micheline Chapman, MD;  Location: [name] CATH LAB;  Service: Cardiovascular;  Laterality: N/A;  LEFT HEART CATHETERIZATION WITH CORONARY ANGIOGRAM N/A 10/28/2014  Procedure: LEFT HEART CATHETERIZATION WITH CORONARY ANGIOGRAM;  Surgeon: Iran Ouch, MD;  Location: [facility] CATH LAB;  Service: Cardiovascular;  Laterality: N/A;  LOWER EXTREMITY ANGIOGRAM Left 05/12/2019  Procedure: Lower Extremity Angiogram;  Surgeon: Sherren Kerns, MD;  Location: [facility] OR;  Service: Vascular;  Laterality: Left;  PATCH ANGIOPLASTY Left 05/12/2019  Procedure: Left Femoral Patch Angioplasty USING CADAVERIC SAPHENOUS VEIN;  Surgeon: Sherren Kerns, MD;  Location: [facility] OR;  Service: Vascular;  Laterality: Left;  PERCUTANEOUS CORONARY STENT INTERVENTION (PCI-S)  05/19/2013  Procedure: PERCUTANEOUS CORONARY STENT INTERVENTION (PCI-S);  Surgeon: Micheline Chapman, MD;  Location: [facility] CATH LAB;  Service: Cardiovascular;;  PERIPHERAL VASCULAR INTERVENTION Right 10/29/2020  Procedure: PERIPHERAL VASCULAR INTERVENTION;  Surgeon: Sherren Kerns, MD;  Location: [facility] INVASIVE CV LAB;  Service: Cardiovascular;  Laterality: Right;  common iliac  PERMANENT PACEMAKER INSERTION N/A 11/04/2014  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  RADIOLOGY WITH ANESTHESIA N/A 08/28/2022  Procedure: RADIOLOGY WITH ANESTHESIA;  Surgeon: Radiologist, Medication, MD;  Location: [facility] OR;  Service: Radiology;  Laterality: N/A;  RADIOLOGY WITH ANESTHESIA N/A 09/12/2023  Procedure: RADIOLOGY WITH ANESTHESIA;  Surgeon: Radiologist, Medication, MD;  Location: [facility] OR;  Service: Radiology;  Laterality: N/A;  TEE WITHOUT CARDIOVERSION N/A 07/17/2016  Procedure: TRANSESOPHAGEAL ECHOCARDIOGRAM (TEE);  Surgeon: Kerin Perna, MD;  Location: [facility] OR;  Service: Open Heart Surgery;  Laterality: N/A;  THROMBECTOMY FEMORAL ARTERY Left 05/12/2019  Procedure: Left Ilio-Femoral Artery and Femoral-popliteal Graft Thrombectomy;  Surgeon:  Sherren Kerns, MD;  Location: [facility] OR;  Service: Vascular;  Laterality: Left;  THROMBECTOMY OF BYPASS GRAFT FEMORAL- POPLITEAL ARTERY Right 11/01/2020  Procedure: POSSIBLE THROMBOLYSIS VERSUS OPEN THROMBECTOMY AND REVSION OF RIGHT FEMORAL-POPLITEAL ARTERY BYPASS;  Surgeon: Sherren Kerns, MD;  Location: [facility] OR;  Service: Vascular;  Laterality: Right;  TUBAL LIGATION    VISCERAL ANGIOGRAPHY N/A 04/25/2019  Procedure: MESENTRIC  ANGIOGRAPHY;  Surgeon: Sherren Kerns, MD;  Location: [facility] INVASIVE CV LAB;  Service: Cardiovascular;  Laterality: N/A;  WOUND DEBRIDEMENT Left 11/03/2019  Procedure: Debridement Wound of left above the knee amputation;  Surgeon: Sherren Kerns, MD;  Location: [facility] OR;  Service: Vascular;  Laterality: Left; DeBlois, Riley Nearing 09/13/2023, 10:50 AM  DG Chest Portable 1 View Result Date: 09/12/2023 CLINICAL DATA:  Stroke workup slurred speech EXAM: PORTABLE CHEST 1 VIEW COMPARISON:  01/31/2019 FINDINGS: Post sternotomy changes. No acute airspace disease or effusion. Normal cardiomediastinal silhouette with aortic atherosclerosis. Leadless pacemaker is noted. IMPRESSION: No active disease. Electronically Signed   By: Jasmine Pang M.D.   On: 09/12/2023 15:41   CT ANGIO HEAD NECK W WO CM W PERF (CODE STROKE) Result Date: 09/12/2023 CLINICAL DATA:  Aphasia, stroke suspected EXAM: CT ANGIOGRAPHY HEAD AND NECK CT PERFUSION BRAIN TECHNIQUE: Multidetector CT imaging of the head and neck was performed using the standard protocol during bolus administration of intravenous contrast. Multiplanar CT image reconstructions and MIPs were obtained to evaluate the vascular anatomy. Carotid stenosis measurements (when applicable) are obtained utilizing NASCET criteria, using the distal internal carotid diameter as the denominator. Multiphase CT imaging of the brain was performed following IV bolus contrast injection. Subsequent parametric perfusion maps were calculated using RAPID software. RADIATION  DOSE REDUCTION: This exam was performed according to the departmental dose-optimization program which includes automated exposure control, adjustment of the mA and/or kV according to patient size and/or use of iterative reconstruction technique. CONTRAST:  100 mL Omnipaque 350 COMPARISON:  CTA 08/28/2022, CT head 09/12/2023 FINDINGS: CT HEAD FINDINGS For noncontrast findings, please see same day CT head. CTA NECK FINDINGS Aortic arch: The arch is not included in the field of view. Right carotid system: 50% stenosis in the proximal right ICA, secondary to noncalcified plaque (series 8, image 269). Moderate stenosis at the origin of the right ECA. No evidence of dissection. Previously noted distal ICA occlusion is no longer seen. Left carotid system: 70% stenosis in the proximal left ICA (series 8, image 421). No significant stenosis at the origin of the ECA. No evidence of dissection. Vertebral arteries: No evidence of dissection, occlusion, or hemodynamically significant stenosis (greater than 50%). Skeleton: No acute osseous abnormality. Degenerative changes in the cervical spine. Other neck: 4 mm hypoenhancing nodule in the left thyroid, for which no follow-up is currently indicated. (Reference: J Am Coll Radiol. 2015 Feb;12(2): 143-50). Fluid in the esophagus, which is patulous, increasing the risk for aspiration. Upper chest: No focal pulmonary opacity or pleural effusion. Emphysema. Review of the MIP images confirms the above findings CTA HEAD  FINDINGS Anterior circulation: Both internal carotid arteries are patent to the termini, with calcifications but without significant stenosis. A1 segments patent. Normal anterior communicating artery. Occlusion of the right A2 just after its origin (series 8, image 146 and 140), with distal reconstitution (series 8, image 112). The left A2 and distal left ACA branches are patent. Mild stenosis in the proximal right M1. Occlusion of the proximal right M2 insular branch  (series 8, image 138), with reconstitution in its M3 branches (series 8, image 133). The more lateral right M3 is subsequently reoccluded distally (series 8, image 124), with additional multifocal occlusion of its downstream branches (series 8, image 123 and 121). While other downstream branches of the more medial right M3 are occluded more superiorly (series 8, image 106). The anterior right M2 and is branches are patent. The left M1 and left MCA branches are patent, without significant stenosis. Posterior circulation: Vertebral arteries patent to the vertebrobasilar junction without significant stenosis. Posterior inferior cerebellar arteries patent proximally. Basilar patent to its distal aspect without significant stenosis. Superior cerebellar arteries patent proximally. Patent right P1. Fetal origin of the left PCA from the left posterior communicating artery. The right posterior communicating artery is also patent. PCAs perfused to their distal aspects without significant stenosis. Venous sinuses: As permitted by contrast timing, patent. Anatomic variants: Fetal left PCA. No evidence of aneurysm or vascular malformation. Review of the MIP images confirms the above findings CT Brain Perfusion Findings: ASPECTS: 10 CBF (<30%) Volume: 5mL Perfusion (Tmax>6.0s) volume: Mismatch Volume: Infarction Location:Infarct core in the right posterior frontal periventricular white matter, penumbra throughout the posterior right MCA territory. Code stroke imaging results were communicated on 09/12/2023 at 2:27 pm to provider Dr. Wilford Corner via telephone, who verbally acknowledged these results. IMPRESSION: 1. Occlusion of the proximal right M2 insular branch, with reconstitution in its M3 branches and multifocal more downstream occlusion and reconstitution. 2. Occlusion of the right A2 just after its origin, with distal reconstitution. 3. Infarct core in the right posterior frontal periventricular white matter, with  penumbra throughout the posterior right MCA territory, measuring 5 mL. 4. 50% stenosis in the proximal right ICA, secondary to noncalcified plaque. 5. 70% stenosis in the proximal left ICA. 6. Fluid in the esophagus, which is patulous, increasing the risk for aspiration. Right MCA occlusion was discussed on 09/12/2023 at 2:27 pm with provider Dr. Wilford Corner via telephone, who verbally acknowledged these results. Right ACA occlusion was communicated on 09/12/2023 at 2:48 pm to provider Dr. Wilford Corner via secure text paging. Electronically Signed   By: Wiliam Ke M.D.   On: 09/12/2023 14:49   CT HEAD CODE STROKE WO CONTRAST` Result Date: 09/12/2023 CLINICAL DATA:  Code stroke.  Aphasia EXAM: CT HEAD WITHOUT CONTRAST TECHNIQUE: Contiguous axial images were obtained from the base of the skull through the vertex without intravenous contrast. RADIATION DOSE REDUCTION: This exam was performed according to the departmental dose-optimization program which includes automated exposure control, adjustment of the mA and/or kV according to patient size and/or use of iterative reconstruction technique. COMPARISON:  09/07/2022 CT head FINDINGS: Brain: No evidence of acute infarction, hemorrhage, mass, mass effect, or midline shift. No hydrocephalus or extra-axial collection. Encephalomalacia in the right lentiform nucleus and external capsule, consistent with sequela of the acute infarct noted in January 2024, with widening of the right sylvian fissure. Basal ganglia calcifications. Vascular: No hyperdense vessel. Skull: Negative for fracture or focal lesion. Sinuses/Orbits: Opacification of the right sphenoid sinus, with osseous thickening of the  walls. Additional milder mucosal thickening in the maxillary sinuses. No acute finding in the orbits. Other: The mastoid air cells are well aerated. ASPECTS [facility] Stroke Program Early CT Score) - Ganglionic level infarction (caudate, lentiform nuclei, internal capsule, insula, M1-M3 cortex):  7 - Supraganglionic infarction (M4-M6 cortex): 3 Total score (0-10 with 10 being normal): 10 IMPRESSION: 1. No acute intracranial process. ASPECTS is 10. 2. 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 3. Chronic right sphenoid sinusitis. Imaging results were communicated on 09/12/2023 at 12:42 pm to provider Dr. Wilford Corner via secure text paging. Electronically Signed   By: Wiliam Ke M.D.   On: 09/12/2023 12:42   (Echo, Carotid, EGD, Colonoscopy, ERCP)    Subjective: Patient seen in the morning rounds.  Unable to talk but very pleasant and looking forward to go home.  She tells me she ate 100% of her breakfast.   Discharge Exam: Vitals:   09/17/23 0341 09/17/23 0823  BP: 136/72 (!) 169/84  Pulse: 60 73  Resp: 18 18  Temp: 98.9 F (37.2 C) 98.1 F (36.7 C)  SpO2: 96% 100%   Vitals:   09/16/23 1955 09/16/23 2337 09/17/23 0341 09/17/23 0823  BP: 133/78 102/86 136/72 (!) 169/84  Pulse: (!) 58 65 60 73  Resp: 16 16 18 18   Temp: 99 F (37.2 C) 99 F (37.2 C) 98.9 F (37.2 C) 98.1 F (36.7 C)  TempSrc: Oral Oral  Oral  SpO2: 96% 98% 96% 100%  Weight:      Height:        General: Pt is alert, awake, not in acute distress Cardiovascular: RRR, S1/S2 +, no rubs, no gallops Respiratory: CTA bilaterally, no wheezing, no rhonchi Abdominal: Soft, NT, ND, bowel sounds + Extremities: no edema, no cyanosis Left facial droop.  Severely dysarthric and unable to make any words.  Writes down on the white board, can express with signs.  Eager to go home. Dense hemiplegia left upper extremity. Left lower extremity she has some range of motion on the hip 3/5.    The results of significant diagnostics from this hospitalization (including imaging, microbiology, ancillary and laboratory) are listed below for reference.     Microbiology: Recent Results (from the past 240 hours)  Resp panel by RT-PCR  (RSV, Flu A&B, Covid) Anterior Nasal Swab     Status: None   Collection Time: 09/12/23  3:18 PM   Specimen: Anterior Nasal Swab  Result Value Ref Range Status   SARS Coronavirus 2 by RT PCR NEGATIVE NEGATIVE Final   Influenza A by PCR NEGATIVE NEGATIVE Final   Influenza B by PCR NEGATIVE NEGATIVE Final    Comment: (NOTE) The Xpert Xpress SARS-CoV-2/FLU/RSV plus assay is intended as an aid in the diagnosis of influenza from Nasopharyngeal swab specimens and should not be used as a sole basis for treatment. Nasal washings and aspirates are unacceptable for Xpert Xpress SARS-CoV-2/FLU/RSV testing.  Fact Sheet for Patients: BloggerCourse.com  Fact Sheet for Healthcare Providers: SeriousBroker.it  This test is not yet approved or cleared by the [place] FDA and has been authorized for detection and/or diagnosis of SARS-CoV-2 by FDA under an Emergency Use Authorization (EUA). This EUA will remain in effect (meaning this test can be used) for the duration of the COVID-19 declaration under Section 564(b)(1) of the Act, 21 U.S.C. section 360bbb-3(b)(1), unless the authorization is terminated or revoked.     Resp Syncytial Virus by  PCR NEGATIVE NEGATIVE Final    Comment: (NOTE) Fact Sheet for Patients: BloggerCourse.com  Fact Sheet for Healthcare Providers: SeriousBroker.it  This test is not yet approved or cleared by the [place] FDA and has been authorized for detection and/or diagnosis of SARS-CoV-2 by FDA under an Emergency Use Authorization (EUA). This EUA will remain in effect (meaning this test can be used) for the duration of the COVID-19 declaration under Section 564(b)(1) of the Act, 21 U.S.C. section 360bbb-3(b)(1), unless the authorization is terminated or revoked.  Performed at [facility], [address], [place], [place] [address]      Labs: BNP  (last 3 results) No results for input(s): "BNP" in the last 8760 hours. Basic Metabolic Panel: Recent Labs  Lab 09/12/23 1225 09/12/23 1232  NA 138 139  K 3.9 3.9  CL 107 108  CO2 23  --   GLUCOSE 99 97  BUN 9 10  CREATININE 0.89 0.90  CALCIUM 9.0  --    Liver Function Tests: Recent Labs  Lab 09/12/23 1225  AST 18  ALT 7  ALKPHOS 71  BILITOT 0.7  PROT 7.6  ALBUMIN 2.8*   No results for input(s): "LIPASE", "AMYLASE" in the last 168 hours. No results for input(s): "AMMONIA" in the last 168 hours. CBC: Recent Labs  Lab 09/12/23 1225 09/12/23 1232  WBC 4.4  --   NEUTROABS 2.4  --   HGB 12.5 13.9  HCT 40.7 41.0  MCV 77.7*  --   PLT 335  --    Cardiac Enzymes: No results for input(s): "CKTOTAL", "CKMB", "CKMBINDEX", "TROPONINI" in the last 168 hours. BNP: Invalid input(s): "POCBNP" CBG: Recent Labs  Lab 09/12/23 1215 09/12/23 1223  GLUCAP 98 91   D-Dimer No results for input(s): "DDIMER" in the last 72 hours. Hgb A1c No results for input(s): "HGBA1C" in the last 72 hours. Lipid Profile No results for input(s): "CHOL", "HDL", "LDLCALC", "TRIG", "CHOLHDL", "LDLDIRECT" in the last 72 hours. Thyroid function studies No results for input(s): "TSH", "T4TOTAL", "T3FREE", "THYROIDAB" in the last 72 hours.  Invalid input(s): "FREET3" Anemia work up No results for input(s): "VITAMINB12", "FOLATE", "FERRITIN", "TIBC", "IRON", "RETICCTPCT" in the last 72 hours. Urinalysis    Component Value Date/Time   COLORURINE YELLOW 11/21/2021 2204   APPEARANCEUR CLEAR 11/21/2021 2204   LABSPEC 1.016 11/21/2021 2204   PHURINE 6.0 11/21/2021 2204   GLUCOSEU NEGATIVE 11/21/2021 2204   HGBUR NEGATIVE 11/21/2021 2204   BILIRUBINUR NEGATIVE 11/21/2021 2204   KETONESUR NEGATIVE 11/21/2021 2204   PROTEINUR NEGATIVE 11/21/2021 2204   UROBILINOGEN 1.0 11/06/2014 1508   NITRITE NEGATIVE 11/21/2021 2204   LEUKOCYTESUR TRACE (A) 11/21/2021 2204   Sepsis Labs Recent Labs  Lab  09/12/23 1225  WBC 4.4   Microbiology Recent Results (from the past 240 hours)  Resp panel by RT-PCR (RSV, Flu A&B, Covid) Anterior Nasal Swab     Status: None   Collection Time: 09/12/23  3:18 PM   Specimen: Anterior Nasal Swab  Result Value Ref Range Status   SARS Coronavirus 2 by RT PCR NEGATIVE NEGATIVE Final   Influenza A by PCR NEGATIVE NEGATIVE Final   Influenza B by PCR NEGATIVE NEGATIVE Final    Comment: (NOTE) The Xpert Xpress SARS-CoV-2/FLU/RSV plus assay is intended as an aid in the diagnosis of influenza from Nasopharyngeal swab specimens and should not be used as a sole basis for treatment. Nasal washings and aspirates are unacceptable for Xpert Xpress SARS-CoV-2/FLU/RSV testing.  Fact Sheet for  Patients: BloggerCourse.com  Fact Sheet for Healthcare Providers: SeriousBroker.it  This test is not yet approved or cleared by the [place] FDA and has been authorized for detection and/or diagnosis of SARS-CoV-2 by FDA under an Emergency Use Authorization (EUA). This EUA will remain in effect (meaning this test can be used) for the duration of the COVID-19 declaration under Section 564(b)(1) of the Act, 21 U.S.C. section 360bbb-3(b)(1), unless the authorization is terminated or revoked.     Resp Syncytial Virus by PCR NEGATIVE NEGATIVE Final    Comment: (NOTE) Fact Sheet for Patients: BloggerCourse.com  Fact Sheet for Healthcare Providers: SeriousBroker.it  This test is not yet approved or cleared by the [place] FDA and has been authorized for detection and/or diagnosis of SARS-CoV-2 by FDA under an Emergency Use Authorization (EUA). This EUA will remain in effect (meaning this test can be used) for the duration of the COVID-19 declaration under Section 564(b)(1) of the Act, 21 U.S.C. section 360bbb-3(b)(1), unless the authorization is terminated  or revoked.  Performed at [facility], [address], [place], [place] [address]      Time coordinating discharge: 35 minutes  SIGNED:   Dorcas Carrow, MD  [facility] 09/17/2023, 10:59 AM

## 2023-09-18 ENCOUNTER — Telehealth: Payer: Self-pay

## 2023-09-18 NOTE — Transitions of Care (Post Inpatient/ED Visit) (Signed)
09/18/2023  Name: Mercedes [name] MRN: [id number] DOB: May 28, 1956  Today's TOC FU Call Status: Today's TOC FU Call Status:: Successful TOC FU Call Completed TOC FU Call Complete Date: 09/18/23 Patient's Name and Date of Birth confirmed.  Transition Care Management Follow-up Telephone Call Date of Discharge: 09/17/23 Discharge Facility: [facility]) Type of Discharge: Inpatient Admission Primary Inpatient Discharge Diagnosis:: cerebral infarction How have you been since you were released from the hospital?: Same Any questions or concerns?: No  Items Reviewed: Did you receive and understand the discharge instructions provided?: Yes Medications obtained,verified, and reconciled?: Yes (Medications Reviewed) Any new allergies since your discharge?: No Dietary orders reviewed?: Yes Do you have support at home?: Yes People in Home: child(ren), adult  Medications Reviewed Today: Medications Reviewed Today     Reviewed by Karena Addison, LPN (Licensed Practical Nurse) on 09/18/23 at [phone]  Med List Status: <None>   Medication Order Taking? Sig Documenting Provider Last Dose Status Informant  acetaminophen (TYLENOL) 500 MG tablet [id number] No Take 500-1,000 mg by mouth every 6 (six) hours as needed for mild pain (pain score 1-3) or moderate pain (pain score 4-6). [provider] Taking Active Child, Pharmacy Records           Med Note Faylene Kurtz May 31, 2023  6:58 PM)    amLODipine (NORVASC) 5 MG tablet [id number]  Take 1 tablet (5 mg total) by mouth daily. Dorcas Carrow, MD  Active   ELIQUIS 5 MG TABS tablet [id number] No TAKE 1 TABLET BY MOUTH TWICE  DAILY Burns, Bobette Mo, MD Taking Active Child, Pharmacy Records           Med Note (COFFELL, Marzella Schlein   Thu Sep 13, 2023  5:00 PM)    ezetimibe (ZETIA) 10 MG tablet [id number]  Take 1 tablet (10 mg total) by mouth daily. Dorcas Carrow, MD  Active   fluticasone [facility]) 50 MCG/ACT nasal spray [id number]   Place 2 sprays into both nostrils daily. Dorcas Carrow, MD  Active   mupirocin ointment (BACTROBAN) 2 % [id number]  Apply 1 Application topically 2 (two) times daily. Pincus Sanes, MD  Active            Med Note (COFFELL, ANGELA M   Thu Sep 13, 2023  5:00 PM)    nitroGLYCERIN (NITROSTAT) 0.4 MG SL tablet [id number] No Place 1 tablet (0.4 mg total) under the tongue every 5 (five) minutes as needed for chest pain. Pincus Sanes, MD Taking Active Child, Pharmacy Records           Med Note (COFFELL, Marzella Schlein   Thu Sep 13, 2023  5:00 PM)    oxyCODONE (OXY IR/ROXICODONE) 5 MG immediate release tablet [id number]  Take 1 tablet (5 mg total) by mouth every 6 (six) hours as needed for severe pain (pain score 7-10) or moderate pain (pain score 4-6). Dorcas Carrow, MD  Active   polyethylene glycol (MIRALAX / GLYCOLAX) 17 g packet [id number] No Take 17 g by mouth daily. Lynnae January, NP Taking Active            Med Note (COFFELL, Marzella Schlein   Thu Sep 13, 2023  5:00 PM)    rosuvastatin (CRESTOR) 40 MG tablet [id number] No Take 1 tablet (40 mg total) by mouth daily. Corrin Parker, PA-C Not Taking Active Child, Pharmacy Records           Med Note (COFFELL, [facility] M  Thu Sep 13, 2023  5:00 PM)    senna-docusate (SENOKOT-S) 8.6-50 MG tablet [id number] No Take 1 tablet by mouth at bedtime as needed for mild constipation. Lynnae January, NP Taking Active            Med Note (COFFELL, ANGELA M   Thu Sep 13, 2023  5:00 PM)              Home Care and Equipment/Supplies: Were Home Health Services Ordered?: Yes [facility] of [facility] Health Agency:: [facility] Has Agency set up a time to come to your home?: No Any new equipment or medical supplies ordered?: Yes Name of Medical supply agency?: [facility] Were you able to get the equipment/medical supplies?: No Do you have any questions related to the use of the equipment/supplies?: No  Functional Questionnaire: Do you need assistance with bathing/showering or dressing?:  Yes Do you need assistance with meal preparation?: Yes Do you need assistance with eating?: No Do you have difficulty maintaining continence: No Do you need assistance with getting out of bed/getting out of a chair/moving?: Yes Do you have difficulty managing or taking your medications?: Yes  Follow up appointments reviewed: PCP Follow-up appointment confirmed?: Yes Date of PCP follow-up appointment?: 09/24/23 Follow[facility] Provider[facility] Follow-up appointment confirmed?: NA Do you need transportation to your follow-up appointment?: No Do you understand care options if your condition(s) worsen?: Yes-patient verbalized understanding    SIGNATURE Karena Addison, LPN [facility] Nurse Health Advisor Direct Dial [phone]

## 2023-09-23 ENCOUNTER — Encounter: Payer: Self-pay | Admitting: Internal Medicine

## 2023-09-23 NOTE — Progress Notes (Addendum)
Subjective:    Patient ID: Mercedes [name], female    DOB: June 15, 1956, 68 y.o.   MRN: [id number]     HPI Mercedes [name] is here for follow up from the hospital.  Admitted 1/15 - 1/20 for acute CVA  Discharged home with PT, OT, ST  hoyer lift  She had fallen out of her chair the day prior to being admitted.  That morning she spoke to her granddaughter on the phone and she noted she had unintelligible speech.  She found her with profound weakness and left sided facial droop and dysarthria.  EMS was called.  Code stroke was called.  Thrombectomy was suggested for right M2, but after multiple discussions this was not carried out.  She was admitted for supportive care and further stroke work up.  She was going to go to a rehab but ended up going home since she had good support at home.   Acute Right MCA cva: Left hemiplegia, new facial droop, dysphagia, severe dysarthria CTA head and neck, occlusion of R M2 and A2 with infarct MRI not possible  Ct head w/o acute finding  Not a tPA candidate - duration uknown Trombectomy suggested but pt declined 2D Echo with normal EF, chronic Regional WMA, no thrombus Continued eliquis LDL 83 - start on crestor 40 mg, zetia 10 mg  A1c 6 - not on treatment Significant dysarthria - ST saw Pt - started dysphagia 1 diet w/ honey thick liquid  Hypertension  - BP stable  Nifedipine xl 30 mg not able be to be crushed - changed to amlodipine 5 mg   Hyperlipidemia: Was on crestor - continued Started on zetia  CAD s/p CABG, AFib: On statin, eliquis Has PPM for tachybrady syndrome  PAD: S/p b/l BKA On eliquis, crestor  Intermittent headache: Tylenol for less severe pain Given oxycodone for mod-severe pain - temporary course   Boil in right gron - was on abx  x 14 days - helped - still there  - has had discharge and foul smell.  No fever.    She is also in need of a power wheelchair.   A power wheel chair will assist the patient in the home  with ADLs; including cooking, cleaning and toileting. A cane, walker or manual wheel chair will not assist patient efficiently due to having had bilateral above knee amputations and left hemiplegia after recent cva.  In addition, a power scooter will not be adequate due to the patients lack of ability to grip the tiller type steering of the equipment. Patient has the mental and physical capacity to follow and operate the electric devices safety instructions. Patient is willing to use in the home as well as outside of the home.       Medications and allergies reviewed with patient and updated if appropriate.  Current Outpatient Medications on File Prior to Visit  Medication Sig Dispense Refill   acetaminophen (TYLENOL) 500 MG tablet Take 500-1,000 mg by mouth every 6 (six) hours as needed for mild pain (pain score 1-3) or moderate pain (pain score 4-6).     amLODipine (NORVASC) 5 MG tablet Take 1 tablet (5 mg total) by mouth daily. 30 tablet 11   ezetimibe (ZETIA) 10 MG tablet Take 1 tablet (10 mg total) by mouth daily. 30 tablet 2   fluticasone (FLONASE) 50 MCG/ACT nasal spray Place 2 sprays into both nostrils daily. 16 g 2   mupirocin ointment (BACTROBAN) 2 % Apply 1 Application topically  2 (two) times daily. 22 g 0   nitroGLYCERIN (NITROSTAT) 0.4 MG SL tablet Place 1 tablet (0.4 mg total) under the tongue every 5 (five) minutes as needed for chest pain. 25 tablet 11   oxyCODONE (OXY IR/ROXICODONE) 5 MG immediate release tablet Take 1 tablet (5 mg total) by mouth every 6 (six) hours as needed for severe pain (pain score 7-10) or moderate pain (pain score 4-6). 20 tablet 0   polyethylene glycol (MIRALAX / GLYCOLAX) 17 g packet Take 17 g by mouth daily. 14 each 0   rosuvastatin (CRESTOR) 40 MG tablet Take 1 tablet (40 mg total) by mouth daily. 90 tablet 3   senna-docusate (SENOKOT-S) 8.6-50 MG tablet Take 1 tablet by mouth at bedtime as needed for mild constipation. 14 tablet 0   No current  facility-administered medications on file prior to visit.     Review of Systems  Constitutional:  Negative for fever.  Respiratory:  Positive for cough (with eating or drinking) and shortness of breath (when moving around). Negative for wheezing.   Cardiovascular:  Negative for chest pain and palpitations.  Gastrointestinal:  Positive for constipation. Negative for abdominal pain.  Neurological:  Positive for headaches. Negative for dizziness.       Objective:   Vitals:   09/24/23 1325 09/24/23 1409  BP: (!) 140/86 134/80  Pulse: 75   Temp: 98.3 F (36.8 C)   SpO2: 96%    BP Readings from Last 3 Encounters:  09/24/23 134/80  09/17/23 (!) 147/86  07/31/23 (!) 126/90   Wt Readings from Last 3 Encounters:  09/12/23 90 lb 6.2 oz (41 kg)  05/31/23 90 lb 6.2 oz (41 kg)  03/07/23 89 lb (40.4 kg)   There is no height or weight on file to calculate BMI.    Physical Exam Constitutional:      General: She is not in acute distress.    Appearance: Normal appearance. She is not ill-appearing.  HENT:     Head: Normocephalic and atraumatic.  Eyes:     Conjunctiva/sclera: Conjunctivae normal.  Cardiovascular:     Rate and Rhythm: Normal rate and regular rhythm.  Pulmonary:     Effort: Pulmonary effort is normal. No respiratory distress.     Breath sounds: Normal breath sounds. No wheezing or rales.  Abdominal:     General: There is no distension.     Palpations: Abdomen is soft.     Tenderness: There is no abdominal tenderness.  Musculoskeletal:     Cervical back: Neck supple. No tenderness.     Comments: S/p b/l AKA  Lymphadenopathy:     Cervical: No cervical adenopathy.  Skin:    General: Skin is warm and dry.     Comments: Lesion in right groin looks like a growth with possibly some discharge-difficult to tell because of the Bactroban ointment.  Area is tender  Neurological:     Mental Status: She is alert. Mental status is at baseline.     Motor: Weakness (LUE  weakness) present.     Comments: Severe dysarthria.  Normal strength in RUE        Lab Results  Component Value Date   WBC 4.4 09/12/2023   HGB 13.9 09/12/2023   HCT 41.0 09/12/2023   PLT 335 09/12/2023   GLUCOSE 97 09/12/2023   CHOL 141 09/13/2023   TRIG 90 09/13/2023   HDL 40 (L) 09/13/2023   LDLCALC 83 09/13/2023   ALT 7 09/12/2023   AST 18 09/12/2023  NA 139 09/12/2023   K 3.9 09/12/2023   CL 108 09/12/2023   CREATININE 0.90 09/12/2023   BUN 10 09/12/2023   CO2 23 09/12/2023   TSH 0.39 07/05/2022   INR 1.1 09/12/2023   HGBA1C 6.0 (H) 09/12/2023   CT HEAD WO CONTRAST ( ) CLINICAL DATA:  Stroke, follow up  EXAM: CT HEAD WITHOUT CONTRAST  TECHNIQUE: Contiguous axial images were obtained from the base of the skull through the vertex without intravenous contrast.  RADIATION DOSE REDUCTION: This exam was performed according to the departmental dose-optimization program which includes automated exposure control, adjustment of the mA and/or kV according to patient size and/or use of iterative reconstruction technique.  COMPARISON:  CT head 09/12/2023.  FINDINGS: Brain: Motion limited study with new areas of hypoattenuation and loss of gray differentiation in the posterior right MCA territory including the parietal and temporal lobes, compatible with for acute infarct. No evidence of mass occupying acute hemorrhage, mass lesion or hydrocephalus.  Vascular: Not well evaluated due to motion.  Skull: No acute fracture.  Sinuses/Orbits: Clear sinuses.  No acute findings.  Other: No mastoid effusions.  IMPRESSION: Motion limited study with new areas of acute infarct in the posterior right MCA territory. No significant mass effect or midline shift.  Electronically Signed   By: Feliberto Harts M.D.   On: 09/13/2023 20:58 ECHOCARDIOGRAM COMPLETE    ECHOCARDIOGRAM REPORT       Patient Name:   Mercedes [name] Date of Exam: 09/13/2023 Medical Rec #:   [id number]         Height:       65.0 in Accession #:    [id number]        Weight:       90.4 lb Date of Birth:  January 31, 1956         BSA:          1.411 m Patient Age:    67 years          BP:           171/99 mmHg Patient Gender: F                 HR:           60 bpm. Exam Location:  Inpatient  Procedure: 2D Echo, Cardiac Doppler and Color Doppler  Indications:    Stroke   History:        Patient has prior history of Echocardiogram examinations, most                 recent 08/29/2022. Risk Factors:Hypertension, Diabetes,                 Dyslipidemia and Former Smoker.   Sonographer:    Karma Ganja Referring Phys: [id number] Cecille Po MELVIN    Sonographer Comments: Technically challenging study due to limited acoustic windows. IMPRESSIONS   1. Left ventricular ejection fraction, by estimation, is 55 to 60%. The left ventricle has normal function. The left ventricle demonstrates regional wall motion abnormalities (see scoring diagram/findings for description). There is moderate asymmetric  left ventricular hypertrophy of the basal-septal segment. Left ventricular diastolic parameters are consistent with Grade I diastolic dysfunction (impaired relaxation).  2. Right ventricular systolic function is normal. The right ventricular size is normal.  3. The mitral valve is normal in structure. Trivial mitral valve regurgitation. No evidence of mitral stenosis.  4. The aortic valve is normal in structure. There is mild calcification of the aortic valve. Aortic  valve regurgitation is not visualized. Aortic valve sclerosis/calcification is present, without any evidence of aortic stenosis.  5. The inferior vena cava is normal in size with greater than 50% respiratory variability, suggesting right atrial pressure of 3 mmHg.  Conclusion(s)/Recommendation(s): Overall EF appears normal. Likely subtle inferoapical hypokinesis.  FINDINGS  Left Ventricle: Left ventricular ejection fraction, by estimation,  is 55 to 60%. The left ventricle has normal function. The left ventricle demonstrates regional wall motion abnormalities. The left ventricular internal cavity size was normal in size.  There is moderate asymmetric left ventricular hypertrophy of the basal-septal segment. Left ventricular diastolic parameters are consistent with Grade I diastolic dysfunction (impaired relaxation).    LV Wall Scoring: The apical inferior segment is hypokinetic.  Right Ventricle: The right ventricular size is normal. No increase in right ventricular wall thickness. Right ventricular systolic function is normal.  Left Atrium: Left atrial size was normal in size.  Right Atrium: Right atrial size was normal in size.  Pericardium: There is no evidence of pericardial effusion.  Mitral Valve: The mitral valve is normal in structure. Trivial mitral valve regurgitation. No evidence of mitral valve stenosis. MV peak gradient, 2.5 mmHg. The mean mitral valve gradient is 1.0 mmHg.  Tricuspid Valve: The tricuspid valve is normal in structure. Tricuspid valve regurgitation is not demonstrated. No evidence of tricuspid stenosis.  Aortic Valve: The aortic valve is normal in structure. There is mild calcification of the aortic valve. Aortic valve regurgitation is not visualized. Aortic valve sclerosis/calcification is present, without any evidence of aortic stenosis. Aortic valve  mean gradient measures 2.0 mmHg. Aortic valve peak gradient measures 3.7 mmHg. Aortic valve area, by VTI measures 2.77 cm.  Pulmonic Valve: The pulmonic valve was normal in structure. Pulmonic valve regurgitation is not visualized. No evidence of pulmonic stenosis.  Aorta: The aortic root is normal in size and structure.  Venous: The inferior vena cava is normal in size with greater than 50% respiratory variability, suggesting right atrial pressure of 3 mmHg.  IAS/Shunts: No atrial level shunt detected by color flow Doppler.    LEFT  VENTRICLE PLAX 2D LVIDd:         4.30 cm   Diastology LVIDs:         3.30 cm   LV e' medial:    5.22 cm/s LV PW:         1.10 cm   LV E/e' medial:  9.3 LV IVS:        1.70 cm   LV e' lateral:   6.20 cm/s LVOT diam:     2.00 cm   LV E/e' lateral: 7.8 LV SV:         49 LV SV Index:   35 LVOT Area:     3.14 cm    RIGHT VENTRICLE            IVC RV Basal diam:  3.00 cm    IVC diam: 1.20 cm RV S prime:     8.49 cm/s TAPSE (M-mode): 1.2 cm  LEFT ATRIUM             Index        RIGHT ATRIUM           Index LA diam:        3.90 cm 2.76 cm/m   RA Area:     12.60 cm LA Vol (A2C):   48.8 ml 34.57 ml/m  RA Volume:   31.70 ml  22.46 ml/m LA Vol (A4C):  18.1 ml 12.82 ml/m LA Biplane Vol: 29.5 ml 20.90 ml/m  AORTIC VALVE AV Area (Vmax):    2.84 cm AV Area (Vmean):   2.52 cm AV Area (VTI):     2.77 cm AV Vmax:           96.10 cm/s AV Vmean:          58.500 cm/s AV VTI:            0.177 m AV Peak Grad:      3.7 mmHg AV Mean Grad:      2.0 mmHg LVOT Vmax:         86.80 cm/s LVOT Vmean:        47.000 cm/s LVOT VTI:          0.156 m LVOT/AV VTI ratio: 0.88   AORTA Ao Root diam: 3.40 cm  MITRAL VALVE MV Area (PHT): 3.60 cm    SHUNTS MV Area VTI:   2.31 cm    Systemic VTI:  0.16 m MV Peak grad:  2.5 mmHg    Systemic Diam: 2.00 cm MV Mean grad:  1.0 mmHg MV Vmax:       0.79 m/s MV Vmean:      49.0 cm/s MV Decel Time: 211 msec MV E velocity: 48.40 cm/s MV A velocity: 60.00 cm/s MV E/A ratio:  0.81  Arvilla Meres MD Electronically signed by Arvilla Meres MD Signature Date/Time: 09/13/2023/11:06:01 AM      Final   DG Swallowing Func-Speech Pathology Table formatting from the original result was not included. Modified Barium Swallow Study  Patient Details  Name: Mercedes [name] MRN: [id number] Date of Birth: September 17, 1955  Today's Date: 09/13/2023  HPI/PMH: HPI: Mercedes [name] is a 68 yo female presenting to ED 1/15 with  slurred speech, L facial droop, and L  sided weakness/numbness. CTA shows R  MCA and ACA occlusions and infarct core in R posterior frontal  periventricular white matter with penumbra throughout the posterior R MCA.  Was recommended to undergo emergent thrombectomy but pt and her family  refused. Previously seen by SLP January 2024 with oropharyngeal dysphagia  and cognitive deficits. MBS 09/13/22 showed continued aspiration of thin  liquids and recommended a Dys 2 diet with nectar thick liquid. PMH  includes anemia, anxiety, CAD, GERD, HLD, HTN, MI, PAD, paroxysmal A-fib,  RA, Sjogren's syndrome, previous CVA (R ICA 08/2022) with significant  residual L sided weakness, tachy-brady syndrome, bilateral BKA  Clinical Impression: Clinical Impression: Imaging was not recorded so this study is documented  based on SLPs initial interpretation in real time. Pt demonstrates a  primary oral dysphagia with significant left CN VII and CN XII weakness.  Pt unable to achieve volitional lingual movement, but uses secondary  movement, rocking, posterior tilt and face wiping to elicit sensation and  subsequent movement. Her impairments are again severe, but seem to be  similar in nature to prior admissions and strokes, so she is quickly using  strategies that have worked in the past. Pt has anterior loss and oral  residue with liquids and thin and nectar thick liquids are aspirated  before/during the swallow due to slightly late airway closure. Controlling  bolus size with teaspoon is again most effective. There is no pharyngeal  residue. When given honey and puree teaspoon pt has a more cohesive oral  phase and there is no penetraiton or aspiration. Pt seems to improve as  study progressess. Will benefit from initaiting a conservative diet for  practice puree/honey but has good potential to upgrade as she has done in  the past. She is unhappy about purees and wants more texture in the  future, but during the study, increased texture  significantly delayed oral  transit and increased oral residue.  Factors that may increase risk of adverse event in presence of aspiration  Rubye Oaks & Clearance Coots 2021): No data recorded  Recommendations/Plan: Swallowing Evaluation Recommendations Swallowing Evaluation Recommendations Recommendations: PO diet PO Diet Recommendation: Dysphagia 1 (Pureed); Moderately thick liquids  (Level 3, honey thick) Liquid Administration via: Spoon Medication Administration: Crushed with puree Supervision: Staff to assist with self-feeding Swallowing strategies  : Slow rate; Small bites/sips; Check for pocketing  or oral holding; Check for anterior loss Postural changes: Position pt fully upright for meals; Stay upright 30-60  min after meals Oral care recommendations: Oral care before PO Caregiver Recommendations: Have oral suction available  Treatment Plan Treatment Plan Treatment recommendations: Therapy as outlined in treatment plan below Follow-up recommendations: Home health SLP Functional status assessment: Patient has had a recent decline in their  functional status and demonstrates the ability to make significant  improvements in function in a reasonable and predictable amount of time. Treatment frequency: Min 2x/week Treatment duration: 2 weeks Interventions: Patient/family education; Aspiration precaution training;  Trials of upgraded texture/liquids; Diet toleration management by SLP  Recommendations Recommendations for follow up therapy are one component of a  multi-disciplinary discharge planning process, led by the attending  physician.  Recommendations may be updated based on patient status,  additional functional criteria and insurance authorization.  Assessment: Orofacial Exam: Orofacial Exam Oral Cavity: Oral Hygiene: Pooled secretions Oral Cavity - Dentition: Adequate natural dentition Orofacial Anatomy: WFL Oral Motor/Sensory Function: Suspected cranial nerve  impairment CN V - Trigeminal: WFL CN VII - Facial: Left motor impairment CN XII - Hypoglossal: Left motor impairment; Right motor impairment  Anatomy:  Anatomy: WFL  Boluses Administered: Boluses Administered Boluses Administered: Thin liquids (Level 0); Mildly thick liquids (Level  2, nectar thick); Moderately thick liquids (Level 3, honey thick); Puree     Oral Impairment Domain: Oral Impairment Domain Lip Closure: Escape beyond mid-chin Tongue control during bolus hold: Cohesive bolus between tongue to palatal  seal Bolus preparation/mastication: Minimal chewing/mashing with majority of  bolus unchewed Bolus transport/lingual motion: Minimal-no tongue motion Oral residue: Residue collection on oral structures Location of oral residue : Tongue; Palate Initiation of pharyngeal swallow : Pyriform sinuses     Pharyngeal Impairment Domain: Pharyngeal Impairment Domain Soft palate elevation: No bolus between soft palate (SP)/pharyngeal wall  (PW) Laryngeal elevation: Complete superior movement of thyroid cartilage with  complete approximation of arytenoids to epiglottic petiole Anterior hyoid excursion: Complete anterior movement Epiglottic movement: Complete inversion Laryngeal vestibule closure: Complete, no air/contrast in laryngeal  vestibule Pharyngeal stripping wave : Present - complete Pharyngoesophageal segment opening: Complete distension and complete  duration, no obstruction of flow Tongue base retraction: No contrast between tongue base and posterior  pharyngeal wall (PPW) Pharyngeal residue: Complete pharyngeal clearance     Esophageal Impairment Domain: No data recorded  Pill: No data recorded  Penetration/Aspiration Scale Score: Penetration/Aspiration Scale Score 1.  Material does not enter airway: Thin liquids (Level 0); Mildly thick  liquids (Level 2, nectar thick); Moderately thick liquids (Level 3, honey  thick); Puree 4.  Material enters  airway, CONTACTS cords then ejected out: Mildly thick  liquids (Level 2, nectar thick); Thin liquids (Level 0) 8.  Material enters airway, passes BELOW cords without attempt by patient  to eject out (silent aspiration) : Thin liquids (Level 0); Mildly thick  liquids (Level 2, nectar thick)  Compensatory Strategies: Compensatory Strategies Compensatory strategies: No      General Information: Caregiver present: No   Diet Prior to this Study: NPO    Temperature : Normal    Respiratory Status: WFL    Supplemental O2: None (Room air)    History of Recent Intubation: No   Behavior/Cognition: Alert; Cooperative  Self-Feeding Abilities: Able to self-feed  Baseline vocal quality/speech: Other (comment) (wet)  Volitional Cough: Able to elicit  Volitional Swallow: Able to elicit  Exam Limitations: Excessive movement  Goal Planning: Prognosis for improved oropharyngeal function: Good  No data recorded No data recorded Patient/Family Stated Goal: coffee  Consulted and agree with results and recommendations: Patient  Pain: Pain Assessment Pain Assessment: No/denies pain  End of Session: Start Time:SLP Start Time (ACUTE ONLY): 0935  Stop Time: SLP Stop Time (ACUTE ONLY): 0956  Time Calculation:SLP Time Calculation (min) (ACUTE ONLY): 21 min  Charges: SLP Evaluations $ SLP Speech Visit: 1 Visit  SLP Evaluations $BSS Swallow: 1 Procedure $MBS Swallow: 1 Procedure $Swallowing Treatment: 1 Procedure  SLP visit diagnosis: SLP Visit Diagnosis: Dysphagia, oral phase (R13.11)  Past Medical History:  Past Medical History:  Diagnosis Date   Abnormal LFTs    a. in the past when on Lipitor   Acute ischemic right ICA stroke (HCC) 08/28/2022   Anemia    as a young woman   Anxiety    Arnold-Chiari malformation, type I (HCC)    MRI brain 02/2010   Atrial flutter with rapid ventricular response vs. SVT 10/27/2014   CAD (coronary artery disease)    a. NSTEMI 07/2009:  LHC - D2 40%, LAD irreg., EF 50% with apical AK  (tako-tsubo CM);  b. Inf STEMI (04/2013): Tx Promus DES to CFX;  c. 08/2013  Lexi CL: No ischemia, dist ant/ap/inf-ap infarct, EF  d. 06/2016 NSTEMI  with LM disease: s/p CABG on 07/17/2016 w/ LIMA-LAD, SVG-OM, and SVG-dRCA.    DEPRESSION    Dizziness    ? 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 anticoagulated with Eliquis 10/2014   Presence of permanent cardiac pacemaker 10/2014   leadless permanent pacemaker   RA (rheumatoid arthritis) (HCC)    prior tx by Dr. Dareen Piano   Sjogren's syndrome (HCC) 06/02/2013   pt denies this (12/01/13)   SLE (systemic lupus erythematosus) (HCC) dx 05/2013   follows with rheum Dareen Piano)   Stroke [facility])    March 2023, weakness in right arm is improving   Tachy-brady syndrome (HCC)    a. 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   f/u echo 09/2009: EF 50-55%, mild LVH, mod diast dysfxn, mild apical HK   Wears dentures    Wears glasses    Past Surgical History:  Past Surgical History:  Procedure Laterality Date   ABDOMINAL AORTAGRAM N/A 11/24/2013   Procedure: ABDOMINAL AORTAGRAM WITH BILATERAL RUNOFF WITH POSSIBLE  INTERVENTION;  Surgeon: Chuck Hint, MD;  Location: [facility] CATH LAB;   Service: Cardiovascular;  Laterality: N/A;   ABDOMINAL AORTOGRAM W/LOWER EXTREMITY N/A 04/25/2019   Procedure: ABDOMINAL AORTOGRAM W/LOWER EXTREMITY;  Surgeon: Sherren Kerns, MD;  Location: [facility] INVASIVE CV LAB;  Service: Cardiovascular;   Laterality: N/A;   ABDOMINAL AORTOGRAM W/LOWER  EXTREMITY Bilateral 05/15/2019   Procedure: ABDOMINAL AORTOGRAM W/LOWER EXTREMITY;  Surgeon: Nada Libman, MD;  Location: [facility] INVASIVE CV LAB;  Service: Cardiovascular;   Laterality: Bilateral;   ABDOMINAL AORTOGRAM W/LOWER EXTREMITY N/A 08/23/2020   Procedure: ABDOMINAL AORTOGRAM W/LOWER EXTREMITY;  Surgeon: Maeola Harman, MD;  Location: [facility] INVASIVE CV LAB;  Service: Cardiovascular;   Laterality: N/A;   ABDOMINAL AORTOGRAM W/LOWER EXTREMITY N/A 10/29/2020   Procedure: ABDOMINAL AORTOGRAM W/LOWER EXTREMITY;  Surgeon: Sherren Kerns, MD;  Location: [facility] INVASIVE CV LAB;  Service: Cardiovascular;   Laterality: N/A;   AMPUTATION Left 12/02/2013   Procedure: LEFT 1ST TOE AMPUTATION;  Surgeon: Sherren Kerns, MD;   Location: [facility] OR;  Service: Vascular;  Laterality: Left;   AMPUTATION Left 07/16/2019   Procedure: LEFT TRANSMETATARSAL AMPUTATION;  Surgeon: Nadara Mustard, MD;   Location: [facility] OR;  Service: Orthopedics;  Laterality: Left;   AMPUTATION Left 10/01/2019   Procedure: LEFT AMPUTATION ABOVE KNEE;  Surgeon: Sherren Kerns, MD;   Location[facility] [facility] OR;  Service: Vascular;  Laterality: Left;   AMPUTATION Right 08/31/2020   Procedure: Right first toe amputation;  Surgeon: Sherren Kerns, MD;   Location: [facility] OR;  Service: Vascular;  Laterality: Right;   AMPUTATION Right 11/23/2020   Procedure: RIGHT AMPUTATION ABOVE KNEE;  Surgeon: Sherren Kerns, MD;   Location: [facility] OR;  Service: Vascular;  Laterality: Right;   AMPUTATION Left 03/14/2022   Procedure: REVISION LEFT ABOVE KNEE AMPUTATION;  Surgeon: Maeola Harman, MD;  Location: [facility] OR;  Service: Vascular;  Laterality: Left;   APPLICATION OF WOUND VAC Left 11/03/2019   Procedure: Application Of Wound Vac, left lower extremity;  Surgeon:  Sherren Kerns, MD;  Location: [facility] OR;  Service: Vascular;  Laterality:  Left;   CARDIAC CATHETERIZATION N/A 07/12/2016   Procedure: Left Heart Cath and Coronary Angiography;   Surgeon: Lennette Bihari, MD;  Location: [facility] INVASIVE CV LAB;  Service: Cardiovascular;   Laterality: N/A;   CARDIAC CATHETERIZATION N/A 07/14/2016   Procedure: Intravascular Ultrasound/IVUS;  Surgeon: Yvonne Kendall, MD;   Location: [facility] INVASIVE CV LAB;  Service: Cardiovascular;  Laterality: N/A;   COLONOSCOPY     CORONARY ANGIOPLASTY  04/2013   CORONARY ARTERY BYPASS GRAFT N/A 07/17/2016   Procedure: CORONARY ARTERY BYPASS GRAFTING (CABG)x3 using right greater  saphenous vein harvested endoscopiclly and left internal mammary artery;   Surgeon: Kerin Perna, MD;  Location: [facility] OR;  Service: Open Heart  Surgery;  Laterality: N/A;   ENDARTERECTOMY FEMORAL Left 05/12/2019   Procedure: LEFT Femoral Endarterectomy;  Surgeon: Sherren Kerns, MD;   Location: [facility] OR;  Service: Vascular;  Laterality: Left;   FEMORAL-POPLITEAL BYPASS GRAFT Left 12/02/2013   Procedure:  LEFT FEMORAL-BELOW KNEE POPLITEAL ARTERY BYPASS GRAFT;   Surgeon: Sherren Kerns, MD;  Location: [facility] OR;  Service: Vascular;   Laterality: Left;   FEMORAL-POPLITEAL BYPASS GRAFT Left 09/25/2019   Procedure: THROMBECTOMY and PROPATEN BYPASS  FEMORAL TO BELOW KNEE  POPLITEAL ARTERY BYPASS GRAFT LEFT LEG;  Surgeon: Larina Earthly, MD;   Location: [facility] OR;  Service: Vascular;  Laterality: Left;   FEMORAL-POPLITEAL BYPASS GRAFT  Right 08/24/2020   Procedure: RIGHT FEMORAL-BELOW KNEE POPLITEAL ARTERY BYPASS USING GORE  PROPATEN GRAFT;  Surgeon: Maeola Harman, MD;  Location: [facility] OR;   Service: Vascular;  Laterality: Right;   FEMORAL-POPLITEAL BYPASS GRAFT Right 11/01/2020   Procedure: Thrombectomy and Revision of Right Femoral to below knee  Bypass with Interpostional graft to Tibial / peroneal Trunk and  Endarterectomy Tibial [place]on Settle Trunk.;  Surgeon: Sherren Kerns, MD;   Location: [facility] OR;  Service: Vascular;  Laterality: Right;   INTRAOPERATIVE ARTERIOGRAM Right 11/01/2020   Procedure: ANTEGRADE RIGHT LOWER EXTREMITY INTRA  OPERATIVE ARTERIOGRAM;   Surgeon: Sherren Kerns, MD;  Location: [facility] OR;  Service: Vascular;   Laterality: Right;   IR CT HEAD LTD  08/28/2022   IR PERCUTANEOUS ART THROMBECTOMY/INFUSION INTRACRANIAL INC DIAG ANGIO   08/28/2022   IR US GUIDE VASC ACCESS RIGHT  08/28/2022   LEFT HEART CATHETERIZATION WITH CORONARY ANGIOGRAM N/A 05/19/2013   Procedure: LEFT HEART CATHETERIZATION WITH CORONARY ANGIOGRAM;  Surgeon:  Micheline Chapman, MD;  Location: [facility] CATH LAB;  Service: Cardiovascular;   Laterality: N/A;   LEFT HEART CATHETERIZATION WITH CORONARY ANGIOGRAM N/A 10/28/2014   Procedure: LEFT HEART CATHETERIZATION WITH CORONARY ANGIOGRAM;  Surgeon:  Iran Ouch, MD;  Location: [facility] CATH LAB;  Service: Cardiovascular;   Laterality: N/A;   LOWER EXTREMITY ANGIOGRAM Left 05/12/2019   Procedure: Lower Extremity Angiogram;  Surgeon: Sherren Kerns, MD;   Location: [facility] OR;  Service: Vascular;  Laterality: Left;   PATCH ANGIOPLASTY Left 05/12/2019   Procedure: Left Femoral Patch Angioplasty USING CADAVERIC SAPHENOUS VEIN;   Surgeon: Sherren Kerns, MD;  Location: [facility] OR;  Service: Vascular;   Laterality: Left;   PERCUTANEOUS CORONARY STENT INTERVENTION (PCI-S)  05/19/2013   Procedure: PERCUTANEOUS CORONARY STENT INTERVENTION (PCI-S);  Surgeon:  Micheline Chapman, MD;  Location: [facility] CATH LAB;  Service: Cardiovascular;;   PERIPHERAL VASCULAR INTERVENTION Right 10/29/2020   Procedure: PERIPHERAL VASCULAR INTERVENTION;  Surgeon: Sherren Kerns,  MD;  Location: [facility] INVASIVE CV LAB;  Service: Cardiovascular;  Laterality:  Right;  common iliac   PERMANENT PACEMAKER INSERTION N/A 11/04/2014   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   RADIOLOGY WITH ANESTHESIA N/A 08/28/2022   Procedure: RADIOLOGY WITH ANESTHESIA;  Surgeon: Radiologist, Medication,  MD;  Location: [facility] OR;  Service: Radiology;  Laterality: N/A;   RADIOLOGY WITH ANESTHESIA N/A 09/12/2023   Procedure: RADIOLOGY WITH ANESTHESIA;   Surgeon: Radiologist, Medication,  MD;  Location: [facility] OR;  Service: Radiology;  Laterality: N/A;   TEE WITHOUT CARDIOVERSION N/A 07/17/2016   Procedure: TRANSESOPHAGEAL ECHOCARDIOGRAM (TEE);  Surgeon: Kerin Perna, MD;  Location: [facility] OR;  Service: Open Heart Surgery;  Laterality:  N/A;   THROMBECTOMY FEMORAL ARTERY Left 05/12/2019   Procedure: Left Ilio-Femoral Artery and Femoral-popliteal Graft  Thrombectomy;  Surgeon: Sherren Kerns, MD;  Location: [facility] OR;  Service:  Vascular;  Laterality: Left;   THROMBECTOMY OF BYPASS GRAFT FEMORAL- POPLITEAL ARTERY Right 11/01/2020   Procedure: POSSIBLE THROMBOLYSIS VERSUS OPEN THROMBECTOMY AND REVSION OF  RIGHT FEMORAL-POPLITEAL ARTERY BYPASS;  Surgeon: Sherren Kerns, MD;   Location: [facility] OR;  Service: Vascular;  Laterality: Right;   TUBAL LIGATION     VISCERAL ANGIOGRAPHY N/A 04/25/2019   Procedure: MESENTRIC  ANGIOGRAPHY;  Surgeon: Sherren Kerns, MD;   [facility] INVASIVE CV LAB;  Service: Cardiovascular;  Laterality: N/A;   WOUND DEBRIDEMENT Left 11/03/2019   Procedure: Debridement Wound of left above  the knee amputation;  Surgeon:  Sherren Kerns, MD;  Location: [facility] OR;  Service: Vascular;  Laterality:  Left;   DeBlois, Riley Nearing 09/13/2023, 10:50 AM    Assessment & Plan:    See Problem List for Assessment and Plan of chronic medical problems.

## 2023-09-24 ENCOUNTER — Ambulatory Visit: Payer: 59 | Admitting: Internal Medicine

## 2023-09-24 VITALS — BP 134/80 | HR 75 | Temp 98.3°F

## 2023-09-24 DIAGNOSIS — E1159 Type 2 diabetes mellitus with other circulatory complications: Secondary | ICD-10-CM | POA: Diagnosis not present

## 2023-09-24 DIAGNOSIS — L98491 Non-pressure chronic ulcer of skin of other sites limited to breakdown of skin: Secondary | ICD-10-CM | POA: Diagnosis not present

## 2023-09-24 DIAGNOSIS — E785 Hyperlipidemia, unspecified: Secondary | ICD-10-CM

## 2023-09-24 DIAGNOSIS — Z89611 Acquired absence of right leg above knee: Secondary | ICD-10-CM

## 2023-09-24 DIAGNOSIS — Z89612 Acquired absence of left leg above knee: Secondary | ICD-10-CM

## 2023-09-24 DIAGNOSIS — I10 Essential (primary) hypertension: Secondary | ICD-10-CM | POA: Diagnosis not present

## 2023-09-24 DIAGNOSIS — I69391 Dysphagia following cerebral infarction: Secondary | ICD-10-CM

## 2023-09-24 DIAGNOSIS — I69954 Hemiplegia and hemiparesis following unspecified cerebrovascular disease affecting left non-dominant side: Secondary | ICD-10-CM

## 2023-09-24 MED ORDER — AMOXICILLIN 875 MG PO TABS: 875.0000 mg | ORAL_TABLET | Freq: Two times a day (BID) | ORAL | 0 refills | Status: AC

## 2023-09-24 MED ORDER — APIXABAN 5 MG PO TABS: 5.0000 mg | ORAL_TABLET | Freq: Two times a day (BID) | ORAL | 1 refills | Status: DC

## 2023-09-24 NOTE — Assessment & Plan Note (Signed)
Chronic   Lab Results  Component Value Date   HGBA1C 6.0 (H) 09/12/2023   Sugars  controlled Diet controlled

## 2023-09-24 NOTE — Patient Instructions (Addendum)
     BP goal < 130/80 - monitor BP at home - we can increase the amlodipine if needed.        Medications changes include :   amoxicillin twice daily for 10 days     Return in about 6 months (around 03/23/2024) for follow up.

## 2023-09-24 NOTE — Assessment & Plan Note (Addendum)
New Secondary to recent CVA - right MCA Will have PT, OT, ST at home Has HHA On eliquis 5 mg bid, zetia 10 mg crestor 40 mg BP better controlled on repeat - advised to monitor at home - goal < 130/80 Sugars controlled dysphagia 1 diet w/ honey thick liquid

## 2023-09-24 NOTE — Assessment & Plan Note (Signed)
Chronic LDL above goal when checked in the hospital Continue Crestor 40 mg daily, Zetia 10 mg daily started

## 2023-09-24 NOTE — Assessment & Plan Note (Signed)
Subacute Present for 6 months or longer Nonhealing Has been on 2 courses of abx already Area is tender Looks like a protruding ulcer or pyogenic granuloma like lesion Has wound appt - not able to get in to see derm anytime soon Start Amoxicillin  875 mg twice daily x 7 days Bactroban ointment twice daily

## 2023-09-24 NOTE — Assessment & Plan Note (Signed)
Chronic BP borderline controlled here today Continue amlodipine 5 mg daily Advised monitoring BP at home and discussed goal

## 2023-09-25 ENCOUNTER — Telehealth: Payer: Self-pay | Admitting: Internal Medicine

## 2023-09-25 NOTE — Telephone Encounter (Unsigned)
Copied from CRM [id number]. Topic: Clinical - Home Health Verbal Orders >> Sep 25, 2023  2:52 PM Prudencio Pair wrote: Caller/Agency: [facility] Callback Number: [phone] Service Requested: Physical Therapy Frequency: Requesting one time a week for 5 weeks for strengthening in lower extremities  Any new concerns about the patient? No

## 2023-09-25 NOTE — Telephone Encounter (Signed)
Okay for orders?

## 2023-09-26 NOTE — Telephone Encounter (Signed)
Message left for verbals today

## 2023-10-03 ENCOUNTER — Telehealth: Payer: Self-pay | Admitting: Internal Medicine

## 2023-10-03 DIAGNOSIS — F419 Anxiety disorder, unspecified: Secondary | ICD-10-CM

## 2023-10-03 DIAGNOSIS — I69354 Hemiplegia and hemiparesis following cerebral infarction affecting left non-dominant side: Secondary | ICD-10-CM | POA: Diagnosis not present

## 2023-10-03 DIAGNOSIS — D649 Anemia, unspecified: Secondary | ICD-10-CM

## 2023-10-03 DIAGNOSIS — I4892 Unspecified atrial flutter: Secondary | ICD-10-CM

## 2023-10-03 DIAGNOSIS — F32A Depression, unspecified: Secondary | ICD-10-CM

## 2023-10-03 DIAGNOSIS — I69391 Dysphagia following cerebral infarction: Secondary | ICD-10-CM | POA: Diagnosis not present

## 2023-10-03 DIAGNOSIS — I69322 Dysarthria following cerebral infarction: Secondary | ICD-10-CM | POA: Diagnosis not present

## 2023-10-03 DIAGNOSIS — E039 Hypothyroidism, unspecified: Secondary | ICD-10-CM

## 2023-10-03 DIAGNOSIS — I480 Paroxysmal atrial fibrillation: Secondary | ICD-10-CM

## 2023-10-03 DIAGNOSIS — E785 Hyperlipidemia, unspecified: Secondary | ICD-10-CM

## 2023-10-03 DIAGNOSIS — I119 Hypertensive heart disease without heart failure: Secondary | ICD-10-CM | POA: Diagnosis not present

## 2023-10-03 NOTE — Telephone Encounter (Signed)
 Copied from CRM [id number]. Topic: Clinical - Home Health Verbal Orders >> Oct 03, 2023 10:30 AM Montie POUR wrote: Caller/Agency: [facility]  Callback Number: [phone] Service Requested: Occupational Therapy Frequency: Evaluation order only  Any new concerns about the patient? No

## 2023-10-03 NOTE — Telephone Encounter (Signed)
Verbals given today to [place].

## 2023-10-03 NOTE — Telephone Encounter (Signed)
 Okay for orders?

## 2023-10-08 ENCOUNTER — Telehealth: Payer: Self-pay | Admitting: Internal Medicine

## 2023-10-08 NOTE — Telephone Encounter (Signed)
Copied from CRM [id number]. Topic: Clinical - Medication Question >> Oct 08, 2023  1:23 PM Sonny Dandy B wrote: Reason for CRM: EMILY from [employer] called to speak with nurse regarding pt's wheel chair. Please call her back at [phone]

## 2023-10-10 ENCOUNTER — Ambulatory Visit (HOSPITAL_BASED_OUTPATIENT_CLINIC_OR_DEPARTMENT_OTHER): Payer: 59 | Admitting: Internal Medicine

## 2023-10-10 NOTE — Telephone Encounter (Signed)
Message left for Irving Burton to return call to clinic.  If she calls back please find out what she is needing.

## 2023-10-11 ENCOUNTER — Ambulatory Visit: Payer: Self-pay | Admitting: Internal Medicine

## 2023-10-11 NOTE — Telephone Encounter (Signed)
Chief Complaint: back pain Symptoms: lower back pain and BLE pain Frequency: 1 month Pertinent Negatives: Patient denies fever, hematuria Disposition: [] ED /[] Urgent Care (no appt availability in office) / [x] Appointment(In office/virtual)/ []  [place] [facility]/ [] Home Care/ [x] Refused Recommended Disposition /[] [facility]/ []  Follow-up with PCP Additional Notes: Daughter Minette Brine calls for patient. Pt has hx of stroke and L-sided weakness. Pt nonverbal at baseline d/t stroke. Daughter reports pt has lower back and BLE pain. Hx of bilateral AKAs as well. Daughter states pt was taking oxycodone for the pain but it has run out and her pain has returned. Daughter states she is worried and wants patient seen again. Pt unable to rate her pain d/t being nonverbal but daughter feels pain is severe. No new numbness or weakness other than baseline L-sided weakness from stroke. No fever. No hematuria. No changes with bowel or bladder. Per protocol, RN advised pt should be seen within 24 hrs. RN offered pt an appt tomorrow at the office, but advised daughter that [facility] does not have an opening until 2/24 so it would have to be with a different provider. Daughter asked pt if pt would be agreeable to see a different provider and pt said no, pt only wants to see Eileen Stanford. Pt then told daughter she doesn't want to go anywhere tomorrow. RN advised daughter RN will relay symptoms to the appropriate people for follow up and daughter said she may call back here tomorrow if pt becomes agreeable to go. RN advised daughter please call 911 if pt has difficulty breathing, fever, or any worsening, daughter verbalized understanding   Copied from CRM [id number]. Topic: Clinical - Red Word Triage >> Oct 11, 2023  3:00 PM Adele Barthel wrote: [facility] that prompted transfer to Nurse Triage: Patient's daughter, Minette Brine, called into report patient having lower back pain and lower extremity pain for past week.    Discharged from hospital on 01/20 after a stroke. Does PT and OT Double amputee (AKA)  Daughter reports PT has advised she be seen by provider to make sure there is not an underlying issue. Reason for Disposition  High-risk adult (e.g., history of cancer, HIV, or IV drug use)  Answer Assessment - Initial Assessment Questions 1. ONSET: "When did the pain begin?"      1 wk - discharged on 1/20 from the hospital. Consistent for the past week. Did not have pain while in the hospital. Was taking oxycodone but is now out. Oxycodone was helpful.  2. LOCATION: "Where does it hurt?" (upper, mid or lower back)     Lower back and lower extremity pain.  3. SEVERITY: "How bad is the pain?"  (e.g., Scale 1-10; mild, moderate, or severe)   - MILD (1-3): Doesn't interfere with normal activities.    - MODERATE (4-7): Interferes with normal activities or awakens from sleep.    - SEVERE (8-10): Excruciating pain, unable to do any normal activities.      Severe according to daughter Minette Brine, but pt is nonverbal at baseline 4. PATTERN: "Is the pain constant?" (e.g., yes, no; constant, intermittent)      Constant 5. RADIATION: "Does the pain shoot into your legs or somewhere else?"     Up her back and into both legs 8. MEDICINES: "What have you taken so far for the pain?" (e.g., nothing, acetaminophen, NSAIDS)     Oxycodone 9. NEUROLOGIC SYMPTOMS: "Do you have any weakness, numbness, or problems with bowel/bladder control?"     L-sided  weakness from stroke deficit at baseline 10. OTHER SYMPTOMS: "Do you have any other symptoms?" (e.g., fever, abdomen pain, burning with urination, blood in urine)       No other symptoms  Protocols used: Back Pain-A-AH

## 2023-10-12 ENCOUNTER — Ambulatory Visit: Payer: 59 | Admitting: Family Medicine

## 2023-10-15 ENCOUNTER — Telehealth: Payer: Self-pay | Admitting: Internal Medicine

## 2023-10-15 NOTE — Telephone Encounter (Signed)
Copied from CRM [id number]. Topic: Clinical - Home Health Verbal Orders >> Oct 15, 2023 11:00 AM Thomes Dinning wrote: Caller/Agency: T[facility] Catchings Physical Therapist w/  [facility] Number: [phone] Service Requested: Physical Therapy Frequency: 1 week 3 Any new concerns about the patient? No

## 2023-10-15 NOTE — Telephone Encounter (Signed)
 Verbals given today.

## 2023-10-17 NOTE — Telephone Encounter (Signed)
Copied from CRM [id number]. Topic: General - Other >> Oct 17, 2023 11:27 AM Turkey A wrote:  Reason for CRM: Irving Burton from [facility] [employer] called and asked if the MD from 09/24/23 can add if there was any discussion of a motorized wheelchair and if there was can there note added for the need of the chair please. Contact number is [phone]

## 2023-10-18 NOTE — Addendum Note (Signed)
Addended by: Pincus Sanes on: 10/18/2023 07:02 PM   Modules accepted: Orders, Level of Service

## 2023-10-18 NOTE — Telephone Encounter (Signed)
Note addended and DME ordered

## 2023-10-19 NOTE — Telephone Encounter (Signed)
Copied from CRM [id number]. Topic: General - Other >> Oct 19, 2023 10:47 AM Deaijah H wrote: Reason for CRM: Irving Burton [employer] called in to return missed call and provide fax number to have addendum sent fax number [phone]

## 2023-10-19 NOTE — Telephone Encounter (Signed)
23 pages faxed today with copy of order.  Fax conformation received.

## 2023-10-19 NOTE — Telephone Encounter (Signed)
Called and left message for Mercedes [name] today on secure voicemail.  Left message that letter had been addendum had been updated and if she needs me to fax order and info she needs to provide fax number to send to.

## 2023-10-30 ENCOUNTER — Telehealth: Payer: Self-pay | Admitting: Internal Medicine

## 2023-10-30 NOTE — Telephone Encounter (Signed)
 Copied from CRM [id number]. Topic: General - Other >> Oct 30, 2023  9:03 AM Fredrich Romans wrote: Reason for CRM: Aram Beecham from [employer] called in stating they received fax from office on yesterday,however it is incomplete. She only received the cover page back but not the forms that were suppose to be competed by provider.   Fax#:[phone]

## 2023-10-30 NOTE — Telephone Encounter (Signed)
 Copied from CRM [id number]. Topic: General - Other >> Oct 30, 2023  2:10 PM Rodman Pickle T wrote: Reason for CRM: [facility] nurse is requesting a  roha cushion for patient wheelchair to decrease the pressure on her bottom patient is complaining of a lot of pain while sitting in her wheelchair

## 2023-11-01 NOTE — Telephone Encounter (Signed)
 Copied from CRM [id number]. Topic: General - Other >> Nov 01, 2023  1:14 PM Deaijah H wrote: Reason for CRM: [employer] Power[employer] faxed 2/25 received back 3/3 but only received cover sheet. If needed, please call [phone]

## 2023-11-07 NOTE — Telephone Encounter (Signed)
 Please refax forms, [employer] states they only received the cover page back  Fax#:[phone]

## 2023-11-13 NOTE — Telephone Encounter (Signed)
 Paperwork located and refaxed back to number provided in message and original number on fax paperwork.  Conformations received.

## 2023-11-13 NOTE — Telephone Encounter (Signed)
 Paperwork located and refaxed today.   Conformation received.

## 2023-11-13 NOTE — Telephone Encounter (Signed)
 [employer] still has not received the full set of PW. Please re-fax or call: [phone]

## 2023-11-13 NOTE — Telephone Encounter (Signed)
 Copied from CRM [id number]. Topic: General - Other >> Nov 01, 2023  1:14 PM Deaijah H wrote: Reason for CRM: [employer] faxed 2/25 received back 3/3 but only received cover sheet. If needed, please call [phone] >> Nov 13, 2023 10:47 AM Truddie Crumble wrote: Aram Beecham from [employer] is calling about a fax that she faxed for the patient and she has not received it back for a power wheel chair. Aram Beecham wants to know if the form can be mailed to them Fax [phone] Address- [address] [place] [address] >> Nov 13, 2023 10:44 AM Truddie Crumble wrote: [phone]

## 2023-11-14 NOTE — Telephone Encounter (Signed)
 A representative from [employer] said they only received a part of the fax. They would like to know if they can come by to pick up a copy of the orders. Best callback is [phone].

## 2023-11-14 NOTE — Telephone Encounter (Signed)
 Left message for Mercedes [name] today.  Paperwork left up front under patient's name for pick up.

## 2023-11-16 ENCOUNTER — Telehealth: Payer: Self-pay | Admitting: Internal Medicine

## 2023-11-16 NOTE — Telephone Encounter (Signed)
 Verbals given today.

## 2023-11-16 NOTE — Telephone Encounter (Signed)
 Copied from CRM [id number]. Topic: Clinical - Home Health Verbal Orders >> Nov 16, 2023 11:04 AM Elizebeth Brooking wrote: Caller/Agency: [facility]/ Callback Number: [phone] Service Requested: Physical Therapy Frequency: 1WEEK 1  Any new concerns about the patient? No -- But havent seen her in a bit

## 2023-11-16 NOTE — Telephone Encounter (Signed)
 Okay for orders?

## 2023-12-28 ENCOUNTER — Other Ambulatory Visit: Payer: Self-pay | Admitting: Internal Medicine

## 2023-12-28 NOTE — Telephone Encounter (Signed)
 Copied from CRM [id number]. Topic: Clinical - Medication Refill >> Dec 28, 2023  1:20 PM Albertha Alosa wrote: Most Recent Primary Care Visit:  Provider: BURNS, Beckey Bourgeois  Department: [facility]  Visit Type: HOSPITAL FU  Date: 09/24/2023  Medication: amLODipine  (NORVASC ) 5 MG tablet   Has the patient contacted their pharmacy? Yes (Agent: If no, request that the patient contact the pharmacy for the refill. If patient does not wish to contact the pharmacy document the reason why and proceed with request.) (Agent: If yes, when and what did the pharmacy advise?)  Is this the correct pharmacy for this prescription? Yes If no, delete pharmacy and type the correct one.  This is the patient's preferred pharmacy:  [id number] - [place], [place] - [address] AT [facility] OF [address] & [address]  [place] [address] Phone: [phone] Fax: [phone]    Has the prescription been filled recently? No  Is the patient out of the medication? Yes  Has the patient been seen for an appointment in the last year OR does the patient have an upcoming appointment? Yes  Can we respond through MyChart? Yes  Agent: Please be advised that Rx refills may take up to 3 business days. We ask that you follow-up with your pharmacy.

## 2023-12-31 NOTE — Telephone Encounter (Signed)
 Pt requested refill of amlodipine  5mg . Per chart review med was written with 11 refills. RN called and spoke to patient advising them to follow-up with their pharmacy. Pt verbalized understanding.

## 2024-01-01 ENCOUNTER — Encounter: Payer: Self-pay | Admitting: Internal Medicine

## 2024-01-01 NOTE — Progress Notes (Unsigned)
      Subjective:    Patient ID: Mercedes [name], female    DOB: 09/13/1955, 68 y.o.   MRN: [id number]     HPI Mercedes [name] is here for follow up of her chronic medical problems.  Doing PT.    Medications and allergies reviewed with patient and updated if appropriate.  Current Outpatient Medications on File Prior to Visit  Medication Sig Dispense Refill   acetaminophen  (TYLENOL ) 500 MG tablet Take 500-1,000 mg by mouth every 6 (six) hours as needed for mild pain (pain score 1-3) or moderate pain (pain score 4-6).     amLODipine  (NORVASC ) 5 MG tablet Take 1 tablet (5 mg total) by mouth daily. 30 tablet 11   apixaban  (ELIQUIS ) 5 MG TABS tablet Take 1 tablet (5 mg total) by mouth 2 (two) times daily. 180 tablet 1   ezetimibe  (ZETIA ) 10 MG tablet Take 1 tablet (10 mg total) by mouth daily. 30 tablet 2   fluticasone  (FLONASE ) 50 MCG/ACT nasal spray Place 2 sprays into both nostrils daily. 16 g 2   mupirocin  ointment (BACTROBAN ) 2 % Apply 1 Application topically 2 (two) times daily. 22 g 0   nitroGLYCERIN  (NITROSTAT ) 0.4 MG SL tablet Place 1 tablet (0.4 mg total) under the tongue every 5 (five) minutes as needed for chest pain. 25 tablet 11   oxyCODONE  (OXY IR/ROXICODONE ) 5 MG immediate release tablet Take 1 tablet (5 mg total) by mouth every 6 (six) hours as needed for severe pain (pain score 7-10) or moderate pain (pain score 4-6). 20 tablet 0   polyethylene glycol (MIRALAX  / GLYCOLAX ) 17 g packet Take 17 g by mouth daily. 14 each 0   rosuvastatin  (CRESTOR ) 40 MG tablet Take 1 tablet (40 mg total) by mouth daily. 90 tablet 3   senna-docusate (SENOKOT-S) 8.6-50 MG tablet Take 1 tablet by mouth at bedtime as needed for mild constipation. 14 tablet 0   No current facility-administered medications on file prior to visit.     Review of Systems     Objective:  There were no vitals filed for this visit. BP Readings from Last 3 Encounters:  09/24/23 134/80  09/17/23 (!) 147/86  07/31/23  (!) 126/90   Wt Readings from Last 3 Encounters:  09/12/23 90 lb 6.2 oz (41 kg)  05/31/23 90 lb 6.2 oz (41 kg)  03/07/23 89 lb (40.4 kg)   There is no height or weight on file to calculate BMI.    Physical Exam     Lab Results  Component Value Date   WBC 4.4 09/12/2023   HGB 13.9 09/12/2023   HCT 41.0 09/12/2023   PLT 335 09/12/2023   GLUCOSE 97 09/12/2023   CHOL 141 09/13/2023   TRIG 90 09/13/2023   HDL 40 (L) 09/13/2023   LDLCALC 83 09/13/2023   ALT 7 09/12/2023   AST 18 09/12/2023   NA 139 09/12/2023   K 3.9 09/12/2023   CL 108 09/12/2023   CREATININE 0.90 09/12/2023   BUN 10 09/12/2023   CO2 23 09/12/2023   TSH 0.39 07/05/2022   INR 1.1 09/12/2023   HGBA1C 6.0 (H) 09/12/2023     Assessment & Plan:    See Problem List for Assessment and Plan of chronic medical problems.

## 2024-01-02 ENCOUNTER — Ambulatory Visit (INDEPENDENT_AMBULATORY_CARE_PROVIDER_SITE_OTHER): Admitting: Internal Medicine

## 2024-01-02 VITALS — BP 144/86 | HR 78 | Temp 98.0°F

## 2024-01-02 DIAGNOSIS — I480 Paroxysmal atrial fibrillation: Secondary | ICD-10-CM

## 2024-01-02 DIAGNOSIS — E1159 Type 2 diabetes mellitus with other circulatory complications: Secondary | ICD-10-CM | POA: Diagnosis not present

## 2024-01-02 DIAGNOSIS — E0590 Thyrotoxicosis, unspecified without thyrotoxic crisis or storm: Secondary | ICD-10-CM

## 2024-01-02 DIAGNOSIS — R32 Unspecified urinary incontinence: Secondary | ICD-10-CM

## 2024-01-02 DIAGNOSIS — E785 Hyperlipidemia, unspecified: Secondary | ICD-10-CM

## 2024-01-02 DIAGNOSIS — I69354 Hemiplegia and hemiparesis following cerebral infarction affecting left non-dominant side: Secondary | ICD-10-CM

## 2024-01-02 DIAGNOSIS — Z89612 Acquired absence of left leg above knee: Secondary | ICD-10-CM

## 2024-01-02 DIAGNOSIS — L98491 Non-pressure chronic ulcer of skin of other sites limited to breakdown of skin: Secondary | ICD-10-CM

## 2024-01-02 DIAGNOSIS — I10 Essential (primary) hypertension: Secondary | ICD-10-CM | POA: Diagnosis not present

## 2024-01-02 DIAGNOSIS — I69391 Dysphagia following cerebral infarction: Secondary | ICD-10-CM | POA: Diagnosis not present

## 2024-01-02 DIAGNOSIS — L98471 Non-pressure chronic ulcer of groin limited to breakdown of skin: Secondary | ICD-10-CM

## 2024-01-02 DIAGNOSIS — Z89611 Acquired absence of right leg above knee: Secondary | ICD-10-CM

## 2024-01-02 LAB — COMPREHENSIVE METABOLIC PANEL WITH GFR
ALT: 7 U/L (ref 0–35)
AST: 18 U/L (ref 0–37)
Albumin: 3.7 g/dL (ref 3.5–5.2)
Alkaline Phosphatase: 72 U/L (ref 39–117)
BUN: 18 mg/dL (ref 6–23)
CO2: 26 meq/L (ref 19–32)
Calcium: 9.9 mg/dL (ref 8.4–10.5)
Chloride: 104 meq/L (ref 96–112)
Creatinine, Ser: 0.69 mg/dL (ref 0.40–1.20)
GFR: 88.22 mL/min (ref >60.00)
Glucose, Bld: 94 mg/dL (ref 70–99)
Potassium: 3.8 meq/L (ref 3.5–5.1)
Sodium: 136 meq/L (ref 135–145)
Total Bilirubin: 0.4 mg/dL (ref 0.2–1.2)
Total Protein: 8.3 g/dL (ref 6.0–8.3)

## 2024-01-02 LAB — CBC WITH DIFFERENTIAL/PLATELET
Basophils Absolute: 0.1 10*3/uL (ref 0.0–0.1)
Basophils Relative: 1.3 % (ref 0.0–3.0)
Eosinophils Absolute: 0.2 10*3/uL (ref 0.0–0.7)
Eosinophils Relative: 4.9 % (ref 0.0–5.0)
HCT: 40.8 % (ref 36.0–46.0)
Hemoglobin: 12.9 g/dL (ref 12.0–15.0)
Lymphocytes Relative: 40.5 % (ref 12.0–46.0)
Lymphs Abs: 2 10*3/uL (ref 0.7–4.0)
MCHC: 31.6 g/dL (ref 30.0–36.0)
MCV: 76.9 fl — ABNORMAL LOW (ref 78.0–100.0)
Monocytes Absolute: 0.4 10*3/uL (ref 0.1–1.0)
Monocytes Relative: 7.3 % (ref 3.0–12.0)
Neutro Abs: 2.2 10*3/uL (ref 1.4–7.7)
Neutrophils Relative %: 46 % (ref 43.0–77.0)
Platelets: 451 10*3/uL — ABNORMAL HIGH (ref 150.0–400.0)
RBC: 5.31 Mil/uL — ABNORMAL HIGH (ref 3.87–5.11)
RDW: 17.4 % — ABNORMAL HIGH (ref 11.5–15.5)
WBC: 4.9 10*3/uL (ref 4.0–10.5)

## 2024-01-02 LAB — HEMOGLOBIN A1C: Hgb A1c MFr Bld: 5.7 % (ref 4.6–6.5)

## 2024-01-02 LAB — LIPID PANEL
Cholesterol: 163 mg/dL (ref 0–200)
HDL: 54.5 mg/dL (ref >39.00)
LDL Cholesterol: 84 mg/dL (ref 0–99)
NonHDL: 108.29
Total CHOL/HDL Ratio: 3
Triglycerides: 119 mg/dL (ref 0.0–149.0)
VLDL: 23.8 mg/dL (ref 0.0–40.0)

## 2024-01-02 LAB — TSH: TSH: 0.07 u[IU]/mL — ABNORMAL LOW (ref 0.35–5.50)

## 2024-01-02 MED ORDER — AMLODIPINE BESYLATE 5 MG PO TABS: 7.5000 mg | ORAL_TABLET | Freq: Every day | ORAL | 1 refills | Status: DC

## 2024-01-02 MED ORDER — APIXABAN 5 MG PO TABS: 5.0000 mg | ORAL_TABLET | Freq: Two times a day (BID) | ORAL | 1 refills | Status: AC

## 2024-01-02 MED ORDER — ROSUVASTATIN CALCIUM 40 MG PO TABS: 40.0000 mg | ORAL_TABLET | Freq: Every day | ORAL | 1 refills | Status: DC

## 2024-01-02 MED ORDER — EZETIMIBE 10 MG PO TABS: 10.0000 mg | ORAL_TABLET | Freq: Every day | ORAL | 1 refills | Status: DC

## 2024-01-02 NOTE — Assessment & Plan Note (Addendum)
 Chronic BP not really controlled She does not monitor regularly at home but did not bring her log and does not remember her numbers Increase amlodipine  to 7.5 mg daily Advised monitoring BP at home and discussed goal

## 2024-01-02 NOTE — Assessment & Plan Note (Signed)
 Chronic Has urinary incontinence related primarily to mobility issues-s/p bl AKA Incontinence supplies/pull-ups are medically necessary

## 2024-01-02 NOTE — Assessment & Plan Note (Signed)
 History of hyperthyroidism Did see endocrine at 1 point and was on medication Has been off medication for a while Recheck TSH

## 2024-01-02 NOTE — Assessment & Plan Note (Signed)
 Chronic In need of new wheelchair, sliding board for transfer PT/OT ordered Advised follow-up with vascular surgery especially given pain in the legs-did see another vascular surgeon but she is not sure who who advised that she had bilateral blood clots

## 2024-01-02 NOTE — Assessment & Plan Note (Signed)
 Chronic   Lab Results  Component Value Date   HGBA1C 6.0 (H) 09/12/2023   Sugars  controlled Diet controlled Check A1c, urine albumin /creatinine ratio

## 2024-01-02 NOTE — Assessment & Plan Note (Addendum)
 Chronic Has not seen cardiology in a year and a half-advised that she needs to follow-up On eliquis  5 mg bid CBC, CMP, TSH

## 2024-01-02 NOTE — Assessment & Plan Note (Signed)
 Chronic Lab Results  Component Value Date   LDLCALC 83 09/13/2023   LDL above goal Recheck lipids-if LDL still above 70 need to consider injectable Continue Crestor  40 mg daily, Zetia  10 mg daily started

## 2024-01-02 NOTE — Patient Instructions (Addendum)
      Blood work was ordered.       Medications changes include :   increase amlodipine  to 7.5 mg daily     Return in about 6 months (around 07/04/2024) for follow up.    Follow up Dr Vikki Graves and Dr Audery Blazing

## 2024-01-02 NOTE — Assessment & Plan Note (Signed)
 Chronic In need of PT, OT-Home PT/OT ordered In need of new manual wheelchair which she is able to use-her current manual wheelchair has broke Also in need of sliding board for transfers-ordered

## 2024-01-02 NOTE — Assessment & Plan Note (Addendum)
 She states this has cleared up

## 2024-01-02 NOTE — Assessment & Plan Note (Addendum)
 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

## 2024-01-03 NOTE — Addendum Note (Signed)
 Addended by: Colene Dauphin on: 01/03/2024 05:01 PM   Modules accepted: Orders

## 2024-01-04 ENCOUNTER — Other Ambulatory Visit: Payer: Self-pay | Admitting: Internal Medicine

## 2024-01-04 DIAGNOSIS — E1159 Type 2 diabetes mellitus with other circulatory complications: Secondary | ICD-10-CM

## 2024-01-09 ENCOUNTER — Ambulatory Visit: Payer: Self-pay

## 2024-01-22 ENCOUNTER — Telehealth: Payer: Self-pay | Admitting: Internal Medicine

## 2024-01-22 NOTE — Telephone Encounter (Unsigned)
 Copied from CRM [id number]. Topic: General - Other >> Jan 22, 2024  1:38 PM Howard Macho wrote: Reason for CRM: corinne from [facility] [employer] called stating they sent an order to the doctor for supplemental shakes for the patient but it was sent back the same way that they sent it. Corrine did not know if it was sent in error. The order was not signed and there was no notes sent. Corrine states she will resend it  CB [phone] ext [phone]

## 2024-01-24 NOTE — Telephone Encounter (Signed)
 Waiting for form to be resent

## 2024-01-30 ENCOUNTER — Telehealth: Payer: Self-pay | Admitting: Internal Medicine

## 2024-01-30 DIAGNOSIS — I69391 Dysphagia following cerebral infarction: Secondary | ICD-10-CM

## 2024-01-30 NOTE — Telephone Encounter (Signed)
 Copied from CRM [id number]. Topic: Referral - Question >> Jan 30, 2024 12:40 PM Deaijah H wrote: Reason for CRM: [name] in due to patient needing speech therapy evaluation and would not be able to admit patient due to insurance can only cover 12 visits. Would need another referral to a different home health agency. Please call [phone]

## 2024-01-30 NOTE — Telephone Encounter (Signed)
 Referral ordered

## 2024-02-06 ENCOUNTER — Telehealth: Payer: Self-pay | Admitting: Internal Medicine

## 2024-02-06 NOTE — Telephone Encounter (Signed)
 Copied from CRM [id number]. Topic: General - Other >> Feb 06, 2024 11:03 AM Martinique E wrote: Reason for CRM: Milissa Alley from [facility] called stating that she faxed over an order on 6/3 for incontinent supplies for the patient and will be faxing over that order again today. Verified fax number with Milissa Alley.

## 2024-02-07 NOTE — Telephone Encounter (Signed)
 Order was faxed back to [facility] with supplies requested.

## 2024-02-08 NOTE — Telephone Encounter (Signed)
 Notes faxed to Karina today to fax number given in message.

## 2024-02-08 NOTE — Telephone Encounter (Signed)
 Copied from CRM [id number]. Topic: General - Other >> Feb 08, 2024  2:13 PM Jenice Mitts wrote: Reason for CRM: Milissa Alley calling from [facility] fax#[phone]  She is just needing the patients last appointment notes faxed over

## 2024-02-21 ENCOUNTER — Telehealth: Payer: Self-pay | Admitting: Internal Medicine

## 2024-02-21 NOTE — Telephone Encounter (Signed)
 Copied from CRM [id number]. Topic: General - Other >> Feb 21, 2024  8:23 AM Marissa P wrote: Reason for CRM: Tinelle calling [facility] needing last office notes. Fax # [phone] no attn. Was trying to get her to the medical records department but she requested I just submit the request.

## 2024-02-21 NOTE — Telephone Encounter (Signed)
Note faxed today

## 2024-02-22 ENCOUNTER — Telehealth: Payer: Self-pay

## 2024-02-22 NOTE — Telephone Encounter (Signed)
 Copied from CRM [id number]. Topic: General - Other >> Feb 22, 2024 12:44 PM Ernestene P wrote: Reason for CRM: [facility] cannot admit pt for homehealth services- not being safe in home, being left in home by herself- need more assistance at home, [phone]

## 2024-02-26 NOTE — Telephone Encounter (Signed)
 Number provided is a non working number and no other information was provided (name or correct number)

## 2024-03-18 ENCOUNTER — Encounter: Payer: Self-pay | Admitting: Internal Medicine

## 2024-03-18 NOTE — Patient Instructions (Addendum)
      Blood work was ordered.       Medications changes include :   None   A referral was ordered for social work to help with rehab placement if possible.    Return in about 6 months (around 09/19/2024) for follow up.

## 2024-03-18 NOTE — Progress Notes (Unsigned)
      Subjective:    Patient ID: Mercedes [name], female    DOB: Jan 03, 1956, 68 y.o.   MRN: [id number]     HPI Corianne is here for follow up of her chronic medical problems.  Amlodipine  increased 2 months ago.   In may thyroid  was overactive.   Medications and allergies reviewed with patient and updated if appropriate.  Current Outpatient Medications on File Prior to Visit  Medication Sig Dispense Refill   acetaminophen  (TYLENOL ) 500 MG tablet Take 500-1,000 mg by mouth every 6 (six) hours as needed for mild pain (pain score 1-3) or moderate pain (pain score 4-6).     amLODipine  (NORVASC ) 5 MG tablet Take 1.5 tablets (7.5 mg total) by mouth daily. 135 tablet 1   apixaban  (ELIQUIS ) 5 MG TABS tablet Take 1 tablet (5 mg total) by mouth 2 (two) times daily. 180 tablet 1   ezetimibe  (ZETIA ) 10 MG tablet Take 1 tablet (10 mg total) by mouth daily. 90 tablet 1   fluticasone  (FLONASE ) 50 MCG/ACT nasal spray Place 2 sprays into both nostrils daily. 16 g 2   nitroGLYCERIN  (NITROSTAT ) 0.4 MG SL tablet Place 1 tablet (0.4 mg total) under the tongue every 5 (five) minutes as needed for chest pain. 25 tablet 11   polyethylene glycol (MIRALAX  / GLYCOLAX ) 17 g packet Take 17 g by mouth daily. 14 each 0   rosuvastatin  (CRESTOR ) 40 MG tablet Take 1 tablet (40 mg total) by mouth daily. 90 tablet 1   senna-docusate (SENOKOT-S) 8.6-50 MG tablet Take 1 tablet by mouth at bedtime as needed for mild constipation. 14 tablet 0   No current facility-administered medications on file prior to visit.     Review of Systems     Objective:  There were no vitals filed for this visit. BP Readings from Last 3 Encounters:  01/02/24 (!) 144/86  09/24/23 134/80  09/17/23 (!) 147/86   Wt Readings from Last 3 Encounters:  09/12/23 90 lb 6.2 oz (41 kg)  05/31/23 90 lb 6.2 oz (41 kg)  03/07/23 89 lb (40.4 kg)   There is no height or weight on file to calculate BMI.    Physical Exam     Lab Results   Component Value Date   WBC 4.9 01/02/2024   HGB 12.9 01/02/2024   HCT 40.8 01/02/2024   PLT 451.0 (H) 01/02/2024   GLUCOSE 94 01/02/2024   CHOL 163 01/02/2024   TRIG 119.0 01/02/2024   HDL 54.50 01/02/2024   LDLCALC 84 01/02/2024   ALT 7 01/02/2024   AST 18 01/02/2024   NA 136 01/02/2024   K 3.8 01/02/2024   CL 104 01/02/2024   CREATININE 0.69 01/02/2024   BUN 18 01/02/2024   CO2 26 01/02/2024   TSH 0.07 (L) 01/02/2024   INR 1.1 09/12/2023   HGBA1C 5.7 01/02/2024     Assessment & Plan:    See Problem List for Assessment and Plan of chronic medical problems.

## 2024-03-19 ENCOUNTER — Ambulatory Visit: Admitting: Internal Medicine

## 2024-03-19 VITALS — BP 138/80 | HR 69 | Temp 98.1°F | Ht 65.0 in

## 2024-03-19 DIAGNOSIS — I10 Essential (primary) hypertension: Secondary | ICD-10-CM

## 2024-03-19 DIAGNOSIS — E785 Hyperlipidemia, unspecified: Secondary | ICD-10-CM | POA: Diagnosis not present

## 2024-03-19 DIAGNOSIS — Z89612 Acquired absence of left leg above knee: Secondary | ICD-10-CM

## 2024-03-19 DIAGNOSIS — I69354 Hemiplegia and hemiparesis following cerebral infarction affecting left non-dominant side: Secondary | ICD-10-CM

## 2024-03-19 DIAGNOSIS — Z7409 Other reduced mobility: Secondary | ICD-10-CM

## 2024-03-19 DIAGNOSIS — Z89611 Acquired absence of right leg above knee: Secondary | ICD-10-CM

## 2024-03-19 DIAGNOSIS — I69391 Dysphagia following cerebral infarction: Secondary | ICD-10-CM

## 2024-03-19 DIAGNOSIS — E0590 Thyrotoxicosis, unspecified without thyrotoxic crisis or storm: Secondary | ICD-10-CM

## 2024-03-19 DIAGNOSIS — Z789 Other specified health status: Secondary | ICD-10-CM

## 2024-03-19 LAB — COMPREHENSIVE METABOLIC PANEL WITH GFR
ALT: 37 U/L — ABNORMAL HIGH (ref 0–35)
AST: 43 U/L — ABNORMAL HIGH (ref 0–37)
Albumin: 4 g/dL (ref 3.5–5.2)
Alkaline Phosphatase: 64 U/L (ref 39–117)
BUN: 14 mg/dL (ref 6–23)
CO2: 26 meq/L (ref 19–32)
Calcium: 10 mg/dL (ref 8.4–10.5)
Chloride: 103 meq/L (ref 96–112)
Creatinine, Ser: 0.65 mg/dL (ref 0.40–1.20)
GFR: 89.37 mL/min (ref >60.00)
Glucose, Bld: 98 mg/dL (ref 70–99)
Potassium: 4.1 meq/L (ref 3.5–5.1)
Sodium: 137 meq/L (ref 135–145)
Total Bilirubin: 0.5 mg/dL (ref 0.2–1.2)
Total Protein: 8.6 g/dL — ABNORMAL HIGH (ref 6.0–8.3)

## 2024-03-19 LAB — LIPID PANEL
Cholesterol: 111 mg/dL (ref 0–200)
HDL: 52.8 mg/dL (ref >39.00)
LDL Cholesterol: 37 mg/dL (ref 0–99)
NonHDL: 58.14
Total CHOL/HDL Ratio: 2
Triglycerides: 105 mg/dL (ref 0.0–149.0)
VLDL: 21 mg/dL (ref 0.0–40.0)

## 2024-03-19 LAB — T4, FREE: Free T4: 0.92 ng/dL (ref 0.60–1.60)

## 2024-03-19 LAB — TSH: TSH: 0.27 u[IU]/mL — ABNORMAL LOW (ref 0.35–5.50)

## 2024-03-19 MED ORDER — DICLOFENAC SODIUM 1 % EX GEL: 4.0000 g | Freq: Four times a day (QID) | CUTANEOUS | 5 refills | Status: AC

## 2024-03-19 NOTE — Assessment & Plan Note (Signed)
 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

## 2024-03-19 NOTE — Assessment & Plan Note (Signed)
 Recent TSH was overactive Recheck TSH today

## 2024-03-19 NOTE — Assessment & Plan Note (Signed)
 Chronic Secondary to PAD

## 2024-03-19 NOTE — Assessment & Plan Note (Signed)
 Chronic Lab Results  Component Value Date   LDLCALC 84 01/02/2024   LDL above goal Recheck lipids today Continue Crestor  40 mg daily, Zetia  10 mg daily started

## 2024-03-19 NOTE — Assessment & Plan Note (Signed)
 Chronic Also with left upper extremity paresis, dysarthria Secondary to recent CVA - right MCA On eliquis  5 mg bid, zetia  10 mg crestor  40 mg BP better improved, but still just above goal-advised to monitor at home - goal < 130/80 Sugars controlled dysphagia 1 diet w/ honey thick liquid

## 2024-03-19 NOTE — Assessment & Plan Note (Signed)
 Chronic BP improved, but still just above goal-sounds like it may be slightly better at home Continue to monitor at home Goal less than 130/80 Continue amlodipine  to 7.5 mg daily

## 2024-03-19 NOTE — Assessment & Plan Note (Signed)
 Chronic Has been home PT, OT and ST but has not seen much improvement She is dependent on all ADLs She is not able to get in or out of her wheelchair independently and still has significant dysarthria She is interested in going to rehab to get intensive PT, OT and ST to see if that helps She currently lives alone and has home health aides during the day, family stops and regularly Concerned about safety with her living alone Will get social work involved to see about rehab placement It looks like her home health agency has informed DSS of her situation and they are investigating

## 2024-03-20 ENCOUNTER — Ambulatory Visit: Payer: Self-pay | Admitting: Internal Medicine

## 2024-03-20 LAB — THYROID ANTIBODIES (THYROPEROXIDASE & THYROGLOBULIN)
Thyroglobulin Ab: <1 [IU]/mL (ref <=1)
Thyroperoxidase Ab SerPl-aCnc: <1 [IU]/mL (ref <9)

## 2024-03-24 ENCOUNTER — Ambulatory Visit: Payer: 59 | Admitting: Internal Medicine

## 2024-03-28 ENCOUNTER — Telehealth: Payer: Self-pay | Admitting: *Deleted

## 2024-03-28 NOTE — Progress Notes (Signed)
 Complex Care Management Note  Care Guide Note 03/28/2024 Name: SHAMELA HAYDON MRN: [id number] DOB: [birth year]  Shona LITTIE Matt is a 68 y.o. year old female who sees Burns, Glade PARAS, MD for primary care. I reached out to Shona LITTIE Matt by phone today to offer complex care management services.  Ms. Pitt was given information about Complex Care Management services today including:   The Complex Care Management services include support from the care team which includes your Nurse Care Manager, Clinical Social Worker, or Pharmacist.  The Complex Care Management team is here to help remove barriers to the health concerns and goals most important to you. Complex Care Management services are voluntary, and the patient may decline or stop services at any time by request to their care team member.   Complex Care Management Consent Status: Patient agreed to services and verbal consent obtained.   Follow up plan:  Telephone appointment with complex care management team member scheduled for:  03/31/2024  Encounter Outcome:  Patient Scheduled  Thedford Franks, CMA [place]  [facility], [facility] Guide Direct Dial: [phone]  Fax: [phone] Website: [email or web address]

## 2024-03-31 ENCOUNTER — Other Ambulatory Visit: Payer: Self-pay

## 2024-03-31 NOTE — Patient Instructions (Signed)
 Visit Information  Mercedes [name] was given information about Medicaid Managed Care team care coordination services as a part of their [facility] Community Plan Medicaid benefit.   If you would like to schedule transportation through your [facility], please call the following number at least 2 days in advance of your appointment: [phone]   Rides for urgent appointments can also be made after hours by calling Member Services.  Call the Behavioral Health Crisis Line at [phone], at any time, 24 hours a day, 7 days a week. If you are in danger or need immediate medical attention call 911.   Ms. Brinton - following are the goals we discussed in your visit today:   Goals Addressed             This Visit's Progress    VBCI Social Work Care Plan: LCSW       Problems:   Level of Care Concerns:Facility placement (Patient is requesting assistance with inpatient rehab)  CSW Clinical Goal(s):   Over the next 90 days the Patient will work with Social Worker to address concerns related to finding an inpatient rehab..  Interventions:  Level of Care Concerns in a patient with CVA Current level of care: Home with other family or significant other(s): grandchildren. Patient has personal care assistants that assist with care. Evaluation of patient's unmet needs in current living environment ADL's Assessed needs, level of care concerns, how currently meeting needs and barriers to care Problem [facility] strategies reviewed Patient reports that she is receiving assistant by her grandchildren and personal care assistants. Patient reports needing assistance with her ADLs.  Facility  Assessed needs and reviewed facility placement process; as well as the different levels of care Motivational Interviewing employed Reviewed facility placement process :LCSW discussed the process and reaching out to her PCP to assist with getting further direction from her  PCP. LCSW inquired if patient was agreeable to in home in services. Patient is requesting in patient rehab.   Patient Goals/Self-Care Activities:  Coordinate with LCSW  to assist with finding an inpatient rehab.  Plan:   Telephone follow up appointment with care management team member scheduled for:  04/15/2024 at 11:30 am         The patient verbalized understanding of instructions, educational materials, and care plan provided today and DECLINED offer to receive copy of patient instructions, educational materials, and care plan.   Licensed Clinical Social Worker will follow up on 04/15/2024 at 11:30 am  Olam Ally, MSW, LCSW [place]  [facility], [facility] Licensed Clinical Social Worker Direct Dial: [phone]   Following is a copy of your plan of care:  There are no care plans that you recently modified to display for this patient.

## 2024-03-31 NOTE — Patient Outreach (Signed)
 Complex Care Management   Visit Note  03/31/2024  Name:  [name] MRN: [id number] DOB: 11-Nov-1955  Situation: Referral received for Complex Care Management related to Stroke I obtained verbal consent from Patient.  Visit completed with patient  on the phone. LCSW met with patient and patient's personal care assistant for scheduled visit. Personal care assistant assisted with communicating with LCSW. Patient reports wanting assistance with inpatient rehab.  Background:   Past Medical History:  Diagnosis Date   Abnormal LFTs    a. in the past when on Lipitor    Acute ischemic right ICA stroke (HCC) 08/28/2022   Anemia    as a young woman   Anxiety    Arnold-Chiari malformation, type I (HCC)    MRI brain 02/2010   Atrial flutter with rapid ventricular response vs. SVT 10/27/2014   CAD (coronary artery disease)    a. NSTEMI 07/2009: LHC - D2 40%, LAD irreg., EF 50% with apical AK (tako-tsubo CM);  b. Inf STEMI (04/2013): Tx Promus DES to CFX;  [name]. 08/2013 Lexi CL: No ischemia, dist ant/ap/inf-ap infarct, EF  d. 06/2016 NSTEMI with LM disease: s/p CABG on 07/17/2016 w/ LIMA-LAD, SVG-OM, and SVG-dRCA.   DEPRESSION    Dizziness    ? 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 Lenon   Sjogren's syndrome (HCC) 06/02/2013   pt denies this (12/01/13)   SLE (systemic lupus erythematosus) (HCC) dx 05/2013   follows with rheum [name])    Stroke [name])    March 2023, weakness in right arm is improving   Tachy-brady syndrome (HCC)    a. 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   f/u echo 09/2009: EF 50-55%, mild LVH, mod diast dysfxn, mild apical HK   Wears dentures    Wears glasses     Assessment: Patient Reported Symptoms:  Cognitive Cognitive Status: Alert and oriented to person, place, and time, Other: (patient struggles with her communicaiton) Cognitive/Intellectual Conditions Management [RPT]: None reported or documented in medical history or problem list      Neurological Neurological Review of Symptoms: No symptoms reported    HEENT HEENT Symptoms Reported: No symptoms reported      Cardiovascular Cardiovascular Symptoms Reported: No symptoms reported Does patient have uncontrolled Hypertension?: No Cardiovascular Self-Management Outcome: 4 (good)  Respiratory Respiratory Symptoms Reported: No symptoms reported    Endocrine Endocrine Symptoms Reported: No symptoms reported Is patient diabetic?: No (Patient reports she is not a diabetic)    Gastrointestinal Gastrointestinal Symptoms Reported: Constipation Additional Gastrointestinal Details: Patient reports constipation      Genitourinary Genitourinary Symptoms Reported: No symptoms reported    Integumentary Integumentary Symptoms Reported: Other, Skin changes (Patient reports getting bed sores from being in the bed) Other Integumentary Symptoms: Patient reports that PCP is unaware and gives LCSW permission to message her doctor Skin Management Strategies: Adequate  rest  Musculoskeletal Musculoskelatal Symptoms Reviewed: Limited mobility Additional Musculoskeletal Details: Patient has limited mobility. patient is a double amputee   Falls in the past year?: No Patient at Risk for Falls Due to: No Fall Risks Fall risk Follow up: Falls evaluation completed  Psychosocial Psychosocial Symptoms Reported: No symptoms  reported Additional Psychological Details: Patients reports no mental health symptoms     Quality of Family Relationships: supportive Do you feel physically threatened by others?: No      03/31/2024    1:15 PM  Depression screen PHQ 2/9  Decreased Interest 0  Down, Depressed, Hopeless 0  PHQ - 2 Score 0    There were no vitals filed for this visit.  Medications Reviewed Today     Reviewed by Mercedes [name] (Social Worker) on 03/31/24 at 1314  Med List Status: <None>   Medication Order Taking? Sig Documenting Provider Last Dose Status Informant  acetaminophen  (TYLENOL ) 500 MG tablet [id number] Yes Take 500-1,000 mg by mouth every 6 (six) hours as needed for mild pain (pain score 1-Mercedes [name]) or moderate pain (pain score 4-6). [provider]  Active Mercedes [name], Pharmacy Records           Med Note Mercedes [name] Mercedes [name] Mercedes [name] Mercedes [name], 2024  Mercedes [name] PM)    amLODipine  (NORVASC ) 5 MG tablet [id number] Yes Take 1.5 tablets (7.5 mg total) by mouth daily. Mercedes Glade PARAS, MD  Active   apixaban  (ELIQUIS ) 5 MG TABS tablet [id number] Yes Take 1 tablet (5 mg total) by mouth 2 (two) times daily. Mercedes Glade PARAS, MD  Active   diclofenac  Sodium (VOLTAREN ) 1 % GEL [id number] Yes Apply 4 g topically 4 (four) times daily. Mercedes Glade PARAS, MD  Active   ezetimibe  (ZETIA ) 10 MG tablet [id number] Yes Take 1 tablet (10 mg total) by mouth daily. Mercedes Glade PARAS, MD  Active   fluticasone  (FLONASE ) 50 MCG/ACT nasal spray [id number]  Place 2 sprays into both nostrils daily.  Patient not taking: Reported on 03/31/2024   Mercedes [name], Kuber, MD  Active   nitroGLYCERIN  (NITROSTAT ) 0.4 MG SL tablet [id number] Yes Place 1 tablet (0.4 mg total) under the tongue every 5 (five) minutes as needed for chest pain. Mercedes Glade PARAS, MD  Active Mercedes [name], Pharmacy Records           Med Note (Mercedes [name], Mercedes [name]   Thu Sep 13, 2023  5:00 PM)    polyethylene glycol (MIRALAX  / GLYCOLAX ) 17 g packet [id number]  Take 17 g by mouth daily.  Patient not  taking: Reported on 03/31/2024   Mercedes, Mercedes [name], Mercedes [name]  Active            Med Note (Mercedes [name], Mercedes [name]   Thu Sep 13, 2023  5:00 PM)    rosuvastatin  (CRESTOR ) 40 MG tablet [id number] Yes Take 1 tablet (40 mg total) by mouth daily. Mercedes Glade PARAS, MD  Active   senna-docusate (SENOKOT-S) 8.6-50 MG tablet [id number]  Take 1 tablet by mouth at bedtime as needed for mild constipation.  Patient not taking: Reported on 03/31/2024   [name], Mercedes [name]  Active            Med Note (Mercedes [name], Mercedes [name]   Thu Sep 13, 2023  5:00 PM)              Recommendation:   Continue Current Plan of Care  Follow Up Plan:   Telephone follow-up 04/15/2024 at 11:30 Mercedes [name].  Olam Mercedes, MSW, LCSW [facility]  [facility], [facility] Licensed Clinical Social Worker Direct Dial: [phone]

## 2024-04-15 ENCOUNTER — Telehealth: Payer: Self-pay | Admitting: Internal Medicine

## 2024-04-15 ENCOUNTER — Other Ambulatory Visit: Payer: Self-pay

## 2024-04-15 NOTE — Telephone Encounter (Signed)
 Copied from CRM [id number]. Topic: Referral - Status >> Apr 15, 2024 12:59 PM Viola F wrote: Reason for CRM: Patient request a call back regarding status of referral to physical therapy. Please call [phone] (M)

## 2024-04-15 NOTE — Patient Outreach (Signed)
 LCSW called patient at scheduled time of visit. LCSW was unable to reach patient and rescheduled patient for 04/24/2024 at 11:30.  Olam Ally, MSW, LCSW [place]  [facility], [facility] Licensed Clinical Social Worker Direct Dial: [phone]

## 2024-04-17 NOTE — Telephone Encounter (Signed)
 Does she want additional home physical therapy?  Last time we had talked she was looking to get into inpatient rehab and was talking with the social worker.

## 2024-04-21 ENCOUNTER — Telehealth: Payer: Self-pay

## 2024-04-21 DIAGNOSIS — I6930 Unspecified sequelae of cerebral infarction: Secondary | ICD-10-CM

## 2024-04-21 DIAGNOSIS — L8990 Pressure ulcer of unspecified site, unspecified stage: Secondary | ICD-10-CM

## 2024-04-21 DIAGNOSIS — Z89611 Acquired absence of right leg above knee: Secondary | ICD-10-CM

## 2024-04-21 NOTE — Telephone Encounter (Signed)
 Copied from CRM #[id number]. Topic: Clinical - Medical Advice >> Apr 21, 2024  2:11 PM [place] wrote: Reason for CRM: Patient wanting to get any type of physical therapy. She is starting to get bed sores. Please reach out as soon as possible

## 2024-04-21 NOTE — Telephone Encounter (Signed)
 Message left for patient

## 2024-04-22 ENCOUNTER — Other Ambulatory Visit

## 2024-04-22 NOTE — Patient Outreach (Signed)
 LCSw called patient due to receiving a message for patient's PCP office. LCSW asked patient to confirm if she wanted a SNF or PT services in the home. Patient confrimed that she is requesting PT services in the home. Patient confirmed that she is no longer seeking placement at a SNF.LCSW informed patient that she would follow up with PCP Burns to alert her of request.  Olam Ally, MSW, LCSW [place]  [facility], [facility] Licensed Clinical Social Worker Direct Dial: [phone]

## 2024-04-24 ENCOUNTER — Other Ambulatory Visit: Payer: Self-pay

## 2024-04-24 NOTE — Patient Instructions (Signed)
 Visit Information  Thank you for taking time to visit with me today. Please don't hesitate to contact me if I can be of assistance to you before our next scheduled appointment.  Your next care management appointment is by telephone on9/05/2024 at 12:00 PM  Please call the care guide team at [phone] if you need to cancel, schedule, or reschedule an appointment.   Please call the Suicide and Crisis Lifeline: 988 if you are experiencing a Mental Health or Behavioral Health Crisis or need someone to talk to.  Olam Ally, MSW, LCSW [place]  [facility], [facility] Licensed Clinical Social Worker Direct Dial: [phone]

## 2024-04-24 NOTE — Addendum Note (Signed)
 Addended by: GEOFM GLADE PARAS on: 04/24/2024 03:05 PM   Modules accepted: Orders

## 2024-04-24 NOTE — Patient Outreach (Signed)
 Complex Care Management   Visit Note  04/24/2024  Name:  Mercedes [name] MRN: [id number] DOB: 04/13/1956  Situation: Referral received for Complex Care Management related to Stroke I obtained verbal consent from Patient.  Visit completed with patient  on the phone. LCSW met with patient for scheduled visit. Patient confirmed previous request that she is no longer seeking inpt rehab and she wants PT and OT in the home. PCP has put in the orders for the [facility] agency to begin services.   Background:   Past Medical History:  Diagnosis Date   Abnormal LFTs    a. in the past when on Lipitor    Acute ischemic right ICA stroke (HCC) 08/28/2022   Anemia    as a young woman   Anxiety    Arnold-Chiari malformation, type I (HCC)    MRI brain 02/2010   Atrial flutter with rapid ventricular response vs. SVT 10/27/2014   CAD (coronary artery disease)    a. NSTEMI 07/2009: LHC - D2 40%, LAD irreg., EF 50% with apical AK (tako-tsubo CM);  b. Inf STEMI (04/2013): Tx Promus DES to CFX;  c. 08/2013 Lexi CL: No ischemia, dist ant/ap/inf-ap infarct, EF  d. 06/2016 NSTEMI with LM disease: s/p CABG on 07/17/2016 w/ LIMA-LAD, SVG-OM, and SVG-dRCA.   DEPRESSION    Dizziness    ? 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 Lenon   Sjogren's syndrome (HCC) 06/02/2013   pt denies this (12/01/13)   SLE (systemic lupus erythematosus) (HCC) dx 05/2013   follows with  rheum [name])   Stroke [facility])    March 2023, weakness in right arm is improving   Tachy-brady syndrome (HCC)    a. 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   f/u echo 09/2009: EF 50-55%, mild LVH, mod diast dysfxn, mild apical HK   Wears dentures    Wears glasses     Assessment: Patient Reported Symptoms:  Cognitive Cognitive Status: Alert and oriented to person, place, and time Cognitive/Intellectual Conditions Management [RPT]: None reported or documented in medical history or problem list      Neurological Neurological Review of Symptoms: Not assessed    HEENT HEENT Symptoms Reported: Not assessed      Cardiovascular Cardiovascular Symptoms Reported: Not assessed    Respiratory Respiratory Symptoms Reported: Not assesed    Endocrine Endocrine Symptoms Reported: Not assessed    Gastrointestinal Gastrointestinal Symptoms Reported: Not assessed      Genitourinary Genitourinary Symptoms Reported: Not assessed    Integumentary Integumentary Symptoms Reported: Not assessed    Musculoskeletal Musculoskelatal Symptoms Reviewed: Not assessed        Psychosocial Psychosocial Symptoms Reported: Not assessed          04/24/2024    PHQ2-9 Depression Screening   Little interest or pleasure in doing things    Feeling down, depressed, or  hopeless    PHQ-2 - Total Score    Trouble falling or staying asleep, or sleeping too much    Feeling tired or having little energy    Poor appetite or overeating     Feeling bad about yourself - or that you are a failure or have let yourself or your family down    Trouble concentrating on things, such as reading the newspaper or watching television    Moving or speaking so slowly that other people could have noticed.  Or the opposite - being so fidgety or restless that you have been moving around a lot more than usual    Thoughts that you would be better off dead, or hurting yourself in some way     PHQ2-9 Total Score    If you checked off any problems, how difficult have these problems made it for you to do your work, take care of things at home, or get along with other people    Depression Interventions/Treatment      There were no vitals filed for this visit.  Medications Reviewed Today     Reviewed by [name] on 04/24/24 at 1135  Med List Status: <None>   Medication Order Taking? Sig Documenting Provider Last Dose Status Informant  acetaminophen  (TYLENOL ) 500 MG tablet [id number]  Take 500-1,000 mg by mouth every 6 (six) hours as needed for mild pain (pain score 1-3) or moderate pain (pain score 4-6). [provider]  Active Child, Pharmacy Records           Med Note [name] [place] May 31, 2023  6:58 PM)    amLODipine  (NORVASC ) 5 MG tablet [id number]  Take 1.5 tablets (7.5 mg total) by mouth daily. Geofm Glade PARAS, MD  Active   apixaban  (ELIQUIS ) 5 MG TABS tablet [id number]  Take 1 tablet (5 mg total) by mouth 2 (two) times daily. Geofm Glade PARAS, MD  Active   diclofenac  Sodium (VOLTAREN ) 1 % GEL [id number]  Apply 4 g topically 4 (four) times daily. Geofm Glade PARAS, MD  Active   ezetimibe  (ZETIA ) 10 MG tablet [id number]  Take 1 tablet (10 mg total) by mouth daily. Geofm Glade PARAS, MD  Expired 03/31/24 2359   fluticasone  (FLONASE ) 50 MCG/ACT nasal spray [id number]  Place 2 sprays into both nostrils daily.  Patient not taking: Reported on 03/31/2024   [name], MD  Active   nitroGLYCERIN  (NITROSTAT ) 0.4 MG SL tablet [id number]  Place 1 tablet (0.4 mg total) under the tongue every 5 (five) minutes as needed for chest pain. Geofm Glade PARAS, MD  Active Child, Pharmacy Records           Med Note (COFFELL, [name]   Thu Sep 13, 2023  5:00 PM)    polyethylene glycol (MIRALAX  / GLYCOLAX ) 17 g packet [id number]  Take 17 g by mouth daily.  Patient not taking: Reported on 03/31/2024   Lehner, Erin C, NP  Active            Med Note (COFFELL,  [name]   Thu Sep 13, 2023  5:00 PM)    rosuvastatin  (CRESTOR ) 40 MG tablet [id number]  Take 1 tablet (40 mg total) by mouth daily. Geofm Glade PARAS, MD  Active   senna-docusate (SENOKOT-S) 8.6-50 MG tablet [id number]  Take 1 tablet by mouth at bedtime as needed for mild constipation.  Patient not taking: Reported on 03/31/2024   [name], NP  Active            Med Note (COFFELL, ANGELA M   Thu Sep 13, 2023  5:00 PM)              Recommendation:   Continue Current Plan of Care  Follow Up Plan:   Telephone follow-up 05/08/2024 at 12:00 PM  Olam Ally, MSW, LCSW [place]  [facility], [facility] Licensed Clinical Social Worker Direct Dial: [phone]

## 2024-05-07 ENCOUNTER — Telehealth: Payer: Self-pay

## 2024-05-07 NOTE — Progress Notes (Signed)
 Complex Care Management Care Guide Note  05/07/2024 Name: Mercedes [name] MRN: [id number] DOB: [birth year]  Mercedes [name] is a 68 y.o. year old female who is a primary care patient of Geofm, Glade PARAS, MD and is actively engaged with the care management team. I reached out to Mercedes [name] by phone today to assist with re-scheduling  with the Licensed Clinical Child psychotherapist.  Follow up plan: Unsuccessful telephone outreach attempt made. A HIPAA compliant phone message was left for the patient providing contact information and requesting a return call.  Leotis Rase [facility], [facility] Guide  Direct Dial: [phone]  Fax [phone]

## 2024-05-08 ENCOUNTER — Telehealth: Payer: Self-pay

## 2024-05-08 ENCOUNTER — Other Ambulatory Visit: Payer: Self-pay | Admitting: Licensed Clinical Social Worker

## 2024-05-09 ENCOUNTER — Telehealth: Payer: Self-pay | Admitting: Radiology

## 2024-05-09 NOTE — Telephone Encounter (Signed)
 Copied from CRM #[id number]. Topic: Clinical - Home Health Verbal Orders >> May 09, 2024  3:37 PM Roselie BROCKS wrote: Caller/Agency: [facility]  Callback Number: [phone] Service Requested: Physical Therapy Frequency: 1 time a week,7 weeks , wanted request a new hospital bed , one she has is too small, only  had right upper extremity. Bi lateral knee amputee. Wants to be more independan. Her bed is too soft for her to balance setting on edge bed Any new concerns about the patient? Yes

## 2024-05-09 NOTE — Telephone Encounter (Signed)
 Okay for orders.  Can we ask them specifically what size bed and if any toppers or different type of mattress she needs ---or what we need to say so she gets exactly what she needs

## 2024-05-13 NOTE — Telephone Encounter (Signed)
 Message left today for return call back.

## 2024-05-18 NOTE — Patient Instructions (Signed)
 Visit Information  Thank you for taking time to visit with me today. Please don't hesitate to contact me if I can be of assistance to you before our next scheduled appointment.  LCSW will call patient to schedule f/up appt  Please call the care guide team at [phone] if you need to cancel, schedule, or reschedule an appointment.   Please call the Suicide and Crisis Lifeline: 988 go to [facility] Urgent [facility] [address] [place] [phone]) call 911 if you are experiencing a Mental Health or Behavioral Health Crisis or need someone to talk to.  Rolin Kerns, LCSW [place]  [facility], [facility] Clinical Social Worker Direct Dial: [phone]  Fax: [phone] Website: [email or web address] 3:00 PM

## 2024-05-18 NOTE — Patient Outreach (Signed)
 Complex Care Management   Visit Note  05/08/2024  Name:  Mercedes [name] MRN: [id number] DOB: 1956/03/18  Situation: Referral received for Complex Care Management related to Stroke I obtained verbal consent from Patient.  Visit completed with Patient  on the phone  Background:   Past Medical History:  Diagnosis Date   Abnormal LFTs    a. in the past when on Lipitor    Acute ischemic right ICA stroke (HCC) 08/28/2022   Anemia    as a young woman   Anxiety    Arnold-Chiari malformation, type I (HCC)    MRI brain 02/2010   Atrial flutter with rapid ventricular response vs. SVT 10/27/2014   CAD (coronary artery disease)    a. NSTEMI 07/2009: LHC - D2 40%, LAD irreg., EF 50% with apical AK (tako-tsubo CM);  b. Inf STEMI (04/2013): Tx Promus DES to CFX;  c. 08/2013 Lexi CL: No ischemia, dist ant/ap/inf-ap infarct, EF  d. 06/2016 NSTEMI with LM disease: s/p CABG on 07/17/2016 w/ LIMA-LAD, SVG-OM, and SVG-dRCA.   DEPRESSION    Dizziness    ? 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 Lenon   Sjogren's syndrome (HCC) 06/02/2013   pt denies this (12/01/13)   SLE (systemic lupus erythematosus) (HCC) dx 05/2013   follows with rheum Mercedes [name])   Stroke [facility])    March 2023, weakness in right arm is improving   Tachy-brady syndrome (HCC)    a. 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   f/u echo 09/2009:  EF 50-55%, mild LVH, mod diast dysfxn, mild apical HK   Wears dentures    Wears glasses     Assessment: Patient Reported Symptoms:  Cognitive Cognitive Status: Alert and oriented to person, place, and time Cognitive/Intellectual Conditions Management [RPT]: None reported or documented in medical history or problem list   Health Maintenance Behaviors: Annual physical exam  Neurological Neurological Review of Symptoms: Not assessed    HEENT HEENT Symptoms Reported: Not assessed      Cardiovascular Cardiovascular Symptoms Reported: Not assessed    Respiratory Respiratory Symptoms Reported: Not assesed    Endocrine Endocrine Symptoms Reported: Not assessed    Gastrointestinal Gastrointestinal Symptoms Reported: Not assessed      Genitourinary Genitourinary Symptoms Reported: Not assessed    Integumentary Integumentary Symptoms Reported: Not assessed    Musculoskeletal Musculoskelatal Symptoms Reviewed: Not assessed        Psychosocial Psychosocial Symptoms Reported: No symptoms reported          05/18/2024    PHQ2-9 Depression Screening   Little interest or pleasure in doing things    Feeling down, depressed, or hopeless    PHQ-2 - Total Score    Trouble falling or staying asleep, or sleeping too much    Feeling tired or having little energy    Poor appetite or overeating  Feeling bad about yourself - or that you are a failure or have let yourself or your family down    Trouble concentrating on things, such as reading the newspaper or watching television    Moving or speaking so slowly that other people could have noticed.  Or the opposite - being so fidgety or restless that you have been moving around a lot more than usual    Thoughts that you would be better off dead, or hurting yourself in some way    PHQ2-9 Total Score    If you checked off any problems, how difficult have these problems made it for you to do your work, take care of things at home, or get along  with other people    Depression Interventions/Treatment      There were no vitals filed for this visit.  Medications Reviewed Today     Reviewed by Mercedes Rolin BIRCH, LCSW (Social Worker) on 05/18/24 at 1437  Med List Status: <None>   Medication Order Taking? Sig Documenting Provider Last Dose Status Informant  acetaminophen  (TYLENOL ) 500 MG tablet [id number]  Take 500-1,000 mg by mouth every 6 (six) hours as needed for mild pain (pain score 1-3) or moderate pain (pain score 4-6). [provider]  Active Child, Pharmacy Records           Med Note Mercedes [name] [place] May 31, 2023  6:58 [name])    amLODipine  (NORVASC ) 5 MG tablet [id number]  Take 1.5 tablets (7.5 mg total) by mouth daily. Mercedes Glade PARAS, MD  Active   apixaban  (ELIQUIS ) 5 MG TABS tablet [id number]  Take 1 tablet (5 mg total) by mouth 2 (two) times daily. Mercedes Glade PARAS, MD  Active   diclofenac  Sodium (VOLTAREN ) 1 % GEL [id number]  Apply 4 g topically 4 (four) times daily. Mercedes Glade PARAS, MD  Active   ezetimibe  (ZETIA ) 10 MG tablet [id number]  Take 1 tablet (10 mg total) by mouth daily. Mercedes Glade PARAS, MD  Expired 03/31/24 2359   fluticasone  (FLONASE ) 50 MCG/ACT nasal spray [id number]  Place 2 sprays into both nostrils daily.  Patient not taking: Reported on 03/31/2024   Mercedes Coria, MD  Active   nitroGLYCERIN  (NITROSTAT ) 0.4 MG SL tablet [id number]  Place 1 tablet (0.4 mg total) under the tongue every 5 (five) minutes as needed for chest pain. Mercedes Glade PARAS, MD  Active Child, Pharmacy Records           Med Note (Mercedes [name])    polyethylene glycol (MIRALAX  / GLYCOLAX ) 17 g packet [id number]  Take 17 g by mouth daily.  Patient not taking: Reported on 03/31/2024   Mercedes Rocky BROCKS, NP  Active            Med Note (Mercedes [name])    rosuvastatin  (CRESTOR ) 40 MG tablet [id number]  Take 1 tablet (40 mg total) by mouth daily. Mercedes Glade PARAS, MD  Active    senna-docusate (SENOKOT-S) 8.6-50 MG tablet [id number]  Take 1 tablet by mouth at bedtime as needed for mild constipation.  Patient not taking: Reported on 03/31/2024   Mercedes Rocky BROCKS, NP  Active            Med Note (Mercedes [name])              Recommendation:  Continue Current Plan of Care  Follow Up Plan:   Telephone follow-up LCSW will call pt to schedule f/up appt  Rolin Kerns, LCSW [place]  [name], [facility] Clinical Social Worker Direct Dial: [phone]  Fax: [phone] Website: [email or web address] 2:58 [name]

## 2024-05-27 ENCOUNTER — Telehealth: Payer: Self-pay

## 2024-05-29 ENCOUNTER — Telehealth: Payer: Self-pay | Admitting: Internal Medicine

## 2024-05-29 NOTE — Telephone Encounter (Signed)
 Medication was d/c during one of her hospital stays and was never renewed.  She should be taking amlodipine  for BP.

## 2024-05-29 NOTE — Telephone Encounter (Signed)
 Message left for patient today.

## 2024-05-29 NOTE — Telephone Encounter (Signed)
 Copied from CRM [id number]. Topic: Clinical - Medication Refill >> May 29, 2024  1:45 PM Franky GRADE wrote: Medication: metoprolol  / blood pressure medication   Has the patient contacted their pharmacy? No (Agent: If no, request that the patient contact the pharmacy for the refill. If patient does not wish to contact the pharmacy document the reason why and proceed with request.) (Agent: If yes, when and what did the pharmacy advise?)  This is the patient's preferred pharmacy:  [id number] - [place], [place] - [address] AT [facility] [address] [place] [address] Phone: [phone] Fax: [phone]    Is this the correct pharmacy for this prescription? Yes If no, delete pharmacy and type the correct one.   Has the prescription been filled recently? No  Is the patient out of the medication? Yes  Has the patient been seen for an appointment in the last year OR does the patient have an upcoming appointment? Yes  Can we respond through MyChart? No  Agent: Please be advised that Rx refills may take up to 3 business days. We ask that you follow-up with your pharmacy.

## 2024-06-03 ENCOUNTER — Telehealth: Payer: Self-pay | Admitting: Internal Medicine

## 2024-06-03 NOTE — Telephone Encounter (Unsigned)
 Copied from CRM [id number]. Topic: Clinical - Medication Refill >> Jun 03, 2024  4:45 PM Joesph B wrote: Medication: amLODipine  (NORVASC ) 5 MG tablet   Has the patient contacted their pharmacy? Yes (Agent: If no, request that the patient contact the pharmacy for the refill. If patient does not wish to contact the pharmacy document the reason why and proceed with request.) (Agent: If yes, when and what did the pharmacy advise?)  This is the patient's preferred pharmacy:  [id number] - [place], [place] - [address] AT [facility] [address] [place] [address] Phone: [phone] Fax: [phone]    Is this the correct pharmacy for this prescription? Yes If no, delete pharmacy and type the correct one.   Has the prescription been filled recently? No  Is the patient out of the medication? Yes  Has the patient been seen for an appointment in the last year OR does the patient have an upcoming appointment? Yes  Can we respond through MyChart? Yes  Agent: Please be advised that Rx refills may take up to 3 business days. We ask that you follow-up with your pharmacy.

## 2024-06-04 ENCOUNTER — Other Ambulatory Visit: Payer: Self-pay

## 2024-06-04 MED ORDER — AMLODIPINE BESYLATE 5 MG PO TABS: 7.5000 mg | ORAL_TABLET | Freq: Every day | ORAL | 1 refills | Status: AC

## 2024-06-04 NOTE — Telephone Encounter (Signed)
 Ordered

## 2024-06-13 ENCOUNTER — Telehealth: Payer: Self-pay

## 2024-06-13 NOTE — Telephone Encounter (Signed)
 Copied from CRM #[id number]. Topic: General - Other >> Jun 13, 2024 11:22 AM Alfonso ORN wrote: Reason for CRM: [facility] discharging patient this week because she ran out of auth for primary insur referring to other agency accepting insurance  call [phone]

## 2024-07-04 ENCOUNTER — Ambulatory Visit: Admitting: Internal Medicine

## 2024-07-09 ENCOUNTER — Telehealth: Payer: Self-pay

## 2024-07-09 DIAGNOSIS — I69354 Hemiplegia and hemiparesis following cerebral infarction affecting left non-dominant side: Secondary | ICD-10-CM

## 2024-07-09 DIAGNOSIS — Z89612 Acquired absence of left leg above knee: Secondary | ICD-10-CM

## 2024-07-09 DIAGNOSIS — Z89611 Acquired absence of right leg above knee: Secondary | ICD-10-CM

## 2024-07-09 NOTE — Telephone Encounter (Signed)
 Copied from CRM [id number]. Topic: Referral - Request for Referral >> Jul 09, 2024  2:15 PM Jasmin G wrote: Did the patient discuss referral with their provider in the last year? Yes (If No - schedule appointment) (If Yes - send message)  Appointment offered? No  Type of order/referral and detailed reason for visit: Referral needed for a PT that takes Medicaid  Preference of office, provider, location: Pt does not have a preference  If referral order, have you been seen by this specialty before? No (If Yes, this issue or another issue? When? Where?  Can we respond through MyChart? No

## 2024-07-10 ENCOUNTER — Ambulatory Visit (INDEPENDENT_AMBULATORY_CARE_PROVIDER_SITE_OTHER)

## 2024-07-10 ENCOUNTER — Other Ambulatory Visit: Payer: Self-pay | Admitting: Internal Medicine

## 2024-07-10 VITALS — Ht 65.0 in | Wt 90.0 lb

## 2024-07-10 DIAGNOSIS — Z780 Asymptomatic menopausal state: Secondary | ICD-10-CM | POA: Diagnosis not present

## 2024-07-10 DIAGNOSIS — Z1231 Encounter for screening mammogram for malignant neoplasm of breast: Secondary | ICD-10-CM

## 2024-07-10 DIAGNOSIS — Z0000 Encounter for general adult medical examination without abnormal findings: Secondary | ICD-10-CM

## 2024-07-10 NOTE — Telephone Encounter (Signed)
 Referral ordered

## 2024-07-10 NOTE — Patient Instructions (Addendum)
 Mercedes [name],  Thank you for taking the time for your Medicare Wellness Visit. I appreciate your continued commitment to your health goals. Please review the care plan we discussed, and feel free to reach out if I can assist you further.  Please note that Annual Wellness Visits do not include a physical exam. Some assessments may be limited, especially if the visit was conducted virtually. If needed, we may recommend an in-person follow-up with your provider.  Ongoing Care Seeing your primary care provider every 3 to 6 months helps us  monitor your health and provide consistent, personalized care.   Referrals If a referral was made during today's visit and you haven't received any updates within two weeks, please contact the referred provider directly to check on the status.  Recommended Screenings:  Health Maintenance  Topic Date Due   Complete foot exam   Never done   Yearly kidney health urinalysis for diabetes  Never done   Breast Cancer Screening  Never done   DEXA scan (bone density measurement)  Never done   COVID-19 Vaccine (1 - 2025-26 season) Never done   Hemoglobin A1C  07/04/2024   Eye exam for diabetics  07/10/2024*   Zoster (Shingles) Vaccine (1 of 2) 10/10/2024*   DTaP/Tdap/Td vaccine (3 - Td or Tdap) 07/10/2025*   Pneumococcal Vaccine for age over 51 (1 of 2 - PCV) 07/10/2025*   Colon Cancer Screening  07/10/2025*   Yearly kidney function blood test for diabetes  03/19/2025   Medicare Annual Wellness Visit  07/10/2025   Hepatitis C Screening  Completed   Meningitis B Vaccine  Aged Out   Flu Shot  Discontinued  *Topic was postponed. The date shown is not the original due date.       07/10/2024    1:55 PM  Advanced Directives  Does Patient Have a Medical Advance Directive? No    Vision: Annual vision screenings are recommended for early detection of glaucoma, cataracts, and diabetic retinopathy. These exams can also reveal signs of chronic conditions such as  diabetes and high blood pressure.  Dental: Annual dental screenings help detect early signs of oral cancer, gum disease, and other conditions linked to overall health, including heart disease and diabetes.

## 2024-07-10 NOTE — Progress Notes (Cosign Needed Addendum)
 Chief Complaint  Patient presents with   Medicare Wellness     Subjective:  Please attest and cosign this visit due to patients primary care provider not being in the office at the time the visit was completed.  (Pt of Dr Glade Hope)   Mercedes [name] is a 68 y.o. female who presents for a Medicare Annual Wellness Visit.  I connected with  Mercedes [name] on 07/10/24 by a audio enabled telemedicine application and verified that I am speaking with the correct person using two identifiers.  Patient Location: Home  Provider Location: Office/Clinic  Persons Participating in Visit: Patient.  I discussed the limitations of evaluation and management by telemedicine. The patient expressed understanding and agreed to proceed.  Vital Signs: Because this visit was a virtual/telehealth visit, some criteria may be missing or patient reported. Any vitals not documented were not able to be obtained and vitals that have been documented are patient reported.   Allergies (verified) Brilinta  [ticagrelor ], Latex, and Tape   History: Past Medical History:  Diagnosis Date   Abnormal LFTs    a. in the past when on Lipitor    Acute ischemic right ICA stroke (HCC) 08/28/2022   Anemia    as a young woman   Anxiety    Arnold-Chiari malformation, type I (HCC)    MRI brain 02/2010   Atrial flutter with rapid ventricular response vs. SVT 10/27/2014   CAD (coronary artery disease)    a. NSTEMI 07/2009: LHC - D2 40%, LAD irreg., EF 50% with apical AK (tako-tsubo CM);  b. Inf STEMI (04/2013): Tx Promus DES to CFX;  c. 08/2013 Lexi CL: No ischemia, dist ant/ap/inf-ap infarct, EF  d. 06/2016 NSTEMI with LM disease: s/p CABG on 07/17/2016 w/ LIMA-LAD, SVG-OM, and SVG-dRCA.   DEPRESSION    Dizziness    ? 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 Lenon   Sjogren's syndrome 06/02/2013   pt denies this (12/01/13)   SLE (systemic lupus erythematosus) (HCC) dx 05/2013   follows with rheum [name])   Stroke [facility])    March 2023, weakness in right arm is improving   Tachy-brady syndrome (HCC)    a. 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   f/u echo 09/2009: EF 50-55%, mild LVH, mod diast dysfxn, mild apical HK   Wears dentures    Wears glasses    Past Surgical History:  Procedure Laterality Date   ABDOMINAL AORTAGRAM N/A 11/24/2013   Procedure: ABDOMINAL AORTAGRAM WITH BILATERAL RUNOFF WITH POSSIBLE INTERVENTION;  Surgeon: Lonni GORMAN Blade, MD;  Location: [facility] CATH LAB;  Service: Cardiovascular;  Laterality: N/A;   ABDOMINAL AORTOGRAM W/LOWER EXTREMITY N/A 04/25/2019   Procedure: ABDOMINAL AORTOGRAM W/LOWER EXTREMITY;  Surgeon: Harvey Carlin BRAVO, MD;  Location: [facility] INVASIVE CV LAB;  Service: Cardiovascular;  Laterality: N/A;   ABDOMINAL AORTOGRAM W/LOWER EXTREMITY Bilateral  05/15/2019   Procedure: ABDOMINAL AORTOGRAM W/LOWER EXTREMITY;  Surgeon: Serene Gaile ORN, MD;  Location: [facility] INVASIVE CV LAB;  Service: Cardiovascular;  Laterality: Bilateral;   ABDOMINAL AORTOGRAM W/LOWER EXTREMITY N/A 08/23/2020   Procedure: ABDOMINAL AORTOGRAM W/LOWER EXTREMITY;  Surgeon: Sheree Penne Bruckner, MD;  Location: [facility] INVASIVE CV LAB;  Service: Cardiovascular;  Laterality: N/A;   ABDOMINAL AORTOGRAM W/LOWER EXTREMITY N/A 10/29/2020   Procedure: ABDOMINAL AORTOGRAM W/LOWER EXTREMITY;  Surgeon: Harvey Carlin BRAVO, MD;   Location: [facility] INVASIVE CV LAB;  Service: Cardiovascular;  Laterality: N/A;   AMPUTATION Left 12/02/2013   Procedure: LEFT 1ST TOE AMPUTATION;  Surgeon: Carlin BRAVO Harvey, MD;  Location: [facility] OR;  Service: Vascular;  Laterality: Left;   AMPUTATION Left 07/16/2019   Procedure: LEFT TRANSMETATARSAL AMPUTATION;  Surgeon: Harden Jerona GAILS, MD;  Location: [facility] OR;  Service: Orthopedics;  Laterality: Left;   AMPUTATION Left 10/01/2019   Procedure: LEFT AMPUTATION ABOVE KNEE;  Surgeon: Harvey Carlin BRAVO, MD;  Location: [facility] OR;  Service: Vascular;  Laterality: Left;   AMPUTATION Right 08/31/2020   Procedure: Right first toe amputation;  Surgeon: Harvey Carlin BRAVO, MD;  Location: [facility] OR;  Service: Vascular;  Laterality: Right;   AMPUTATION Right 11/23/2020   Procedure: RIGHT AMPUTATION ABOVE KNEE;  Surgeon: Harvey Carlin BRAVO, MD;  Location: [facility] OR;  Service: Vascular;  Laterality: Right;   AMPUTATION Left 03/14/2022   Procedure: REVISION LEFT ABOVE KNEE AMPUTATION;  Surgeon: Sheree Penne Bruckner, MD;  Location: [facility] OR;  Service: Vascular;  Laterality: Left;   APPLICATION OF WOUND VAC Left 11/03/2019   Procedure: Application Of Wound Vac, left lower extremity;  Surgeon: Harvey Carlin BRAVO, MD;  Location: [facility] OR;  Service: Vascular;  Laterality: Left;   CARDIAC CATHETERIZATION N/A 07/12/2016   Procedure: Left Heart Cath and Coronary Angiography;  Surgeon: Debby DELENA Sor, MD;  [facility]: [facility] INVASIVE CV LAB;  Service: Cardiovascular;  Laterality: N/A;   CARDIAC CATHETERIZATION N/A 07/14/2016   Procedure: Intravascular Ultrasound/IVUS;  Surgeon: Bruckner Hanson, MD;  Location: [facility] INVASIVE CV LAB;  Service: Cardiovascular;  Laterality: N/A;   COLONOSCOPY     CORONARY ANGIOPLASTY  04/2013   CORONARY ARTERY BYPASS GRAFT N/A 07/17/2016   Procedure: CORONARY ARTERY BYPASS GRAFTING (CABG)x3 using right greater saphenous vein harvested endoscopiclly and left internal mammary artery;  Surgeon: Maude Fleeta Ochoa, MD;  Location: [facility] OR;   Service: Open Heart Surgery;  Laterality: N/A;   ENDARTERECTOMY FEMORAL Left 05/12/2019   Procedure: LEFT Femoral Endarterectomy;  Surgeon: Harvey Carlin BRAVO, MD;  Location: [facility] OR;  Service: Vascular;  Laterality: Left;   FEMORAL-POPLITEAL BYPASS GRAFT Left 12/02/2013   Procedure:  LEFT FEMORAL-BELOW KNEE POPLITEAL ARTERY BYPASS GRAFT;  Surgeon: Carlin BRAVO Harvey, MD;  Location: [facility] OR;  Service: Vascular;  Laterality: Left;   FEMORAL-POPLITEAL BYPASS GRAFT Left 09/25/2019   Procedure: THROMBECTOMY and PROPATEN BYPASS  FEMORAL TO BELOW KNEE POPLITEAL ARTERY BYPASS GRAFT LEFT LEG;  Surgeon: Oris Krystal FALCON, MD;  [facility] OR;  Service: Vascular;  Laterality: Left;   FEMORAL-POPLITEAL BYPASS GRAFT Right 08/24/2020   Procedure: RIGHT FEMORAL-BELOW KNEE POPLITEAL ARTERY BYPASS USING GORE PROPATEN GRAFT;  Surgeon: Sheree Penne Bruckner, MD;  Location: [facility] OR;  Service: Vascular;  Laterality: Right;   FEMORAL-POPLITEAL BYPASS GRAFT Right 11/01/2020   Procedure: Thrombectomy and Revision of Right Femoral to below knee Bypass with Interpostional graft to Tibial / peroneal Trunk and Endarterectomy Tibial Kennie Trunk.;  Surgeon: Harvey Carlin BRAVO, MD;  Location: [facility] OR;  Service: Vascular;  Laterality: Right;   INTRAOPERATIVE ARTERIOGRAM Right 11/01/2020   Procedure: ANTEGRADE RIGHT LOWER EXTREMITY INTRA OPERATIVE ARTERIOGRAM;  Surgeon: Harvey Carlin BRAVO, MD;  Location: [facility] OR;  Service: Vascular;  Laterality: Right;   IR CT HEAD LTD  08/28/2022   IR PERCUTANEOUS ART THROMBECTOMY/INFUSION INTRACRANIAL INC DIAG ANGIO  08/28/2022   IR US  GUIDE VASC ACCESS RIGHT  08/28/2022   LEFT HEART CATHETERIZATION WITH CORONARY ANGIOGRAM N/A 05/19/2013   Procedure: LEFT HEART CATHETERIZATION WITH CORONARY ANGIOGRAM;  Surgeon: Ozell JONETTA Fell, MD;  Location: [facility] CATH LAB;  Service: Cardiovascular;  Laterality: N/A;   LEFT HEART CATHETERIZATION WITH CORONARY ANGIOGRAM N/A 10/28/2014   Procedure: LEFT HEART CATHETERIZATION WITH CORONARY  ANGIOGRAM;  Surgeon: Deatrice DELENA Cage, MD;  Location: [facility] CATH LAB;  Service: Cardiovascular;  Laterality: N/A;   LOWER EXTREMITY ANGIOGRAM Left 05/12/2019   Procedure: Lower Extremity Angiogram;  Surgeon: Harvey Carlin BRAVO, MD;  Location: [facility] OR;  Service: Vascular;  Laterality: Left;   PATCH ANGIOPLASTY Left 05/12/2019   Procedure: Left Femoral Patch Angioplasty USING CADAVERIC SAPHENOUS VEIN;  Surgeon: Harvey Carlin BRAVO, MD;  Location: [facility] OR;  Service: Vascular;  Laterality: Left;   PERCUTANEOUS CORONARY STENT INTERVENTION (PCI-S)  05/19/2013   Procedure: PERCUTANEOUS CORONARY STENT INTERVENTION (PCI-S);  Surgeon: Ozell JONETTA Fell, MD;  Location: [facility] CATH LAB;  Service: Cardiovascular;;   PERIPHERAL VASCULAR INTERVENTION Right 10/29/2020   Procedure: PERIPHERAL VASCULAR INTERVENTION;  Surgeon: Harvey Carlin BRAVO, MD;  Location: [facility] INVASIVE CV LAB;  Service: Cardiovascular;  Laterality: Right;  common iliac   PERMANENT PACEMAKER INSERTION N/A 11/04/2014   STJ Leadless pacemaker implanted by Dr Kelsie for tachy/brady   RADIOLOGY WITH ANESTHESIA N/A 08/28/2022   Procedure: RADIOLOGY WITH ANESTHESIA;  Surgeon: Radiologist, Medication, MD;  [facility]: [facility] OR;  Service: Radiology;  Laterality: N/A;   RADIOLOGY WITH ANESTHESIA N/A 09/12/2023   Procedure: RADIOLOGY WITH ANESTHESIA;  Surgeon: Radiologist, Medication, MD;  Location: [facility] OR;  Service: Radiology;  Laterality: N/A;   TEE WITHOUT CARDIOVERSION N/A 07/17/2016   Procedure: TRANSESOPHAGEAL ECHOCARDIOGRAM (TEE);  Surgeon: Maude Fleeta Ochoa, MD;  Location: [facility] OR;  Service: Open Heart Surgery;  Laterality: N/A;   THROMBECTOMY FEMORAL ARTERY Left 05/12/2019   Procedure: Left Ilio-Femoral Artery and Femoral-popliteal Graft Thrombectomy;  Surgeon: Harvey Carlin BRAVO, MD;  Location: [facility] OR;  Service: Vascular;  Laterality: Left;   THROMBECTOMY OF BYPASS GRAFT FEMORAL- POPLITEAL ARTERY Right 11/01/2020   Procedure: POSSIBLE THROMBOLYSIS VERSUS OPEN THROMBECTOMY AND REVSION  OF RIGHT FEMORAL-POPLITEAL ARTERY BYPASS;  Surgeon: Harvey Carlin BRAVO, MD;  Location[facility] OR;  Service: Vascular;  Laterality: Right;   TUBAL LIGATION     VISCERAL ANGIOGRAPHY N/A 04/25/2019   Procedure: MESENTRIC  ANGIOGRAPHY;  Surgeon: Harvey Carlin BRAVO, MD;  [facility] INVASIVE CV LAB;  Service: Cardiovascular;  Laterality: N/A;   WOUND DEBRIDEMENT Left 11/03/2019   Procedure: Debridement Wound of left above the knee amputation;  Surgeon: Harvey Carlin BRAVO, MD;  Location: [facility] OR;  Service: Vascular;  Laterality: Left;   Family History  Problem Relation Age of Onset   Arthritis Mother    Emphysema Mother    Arthritis Father    COPD Father    Heart disease Father    Diabetes Paternal Aunt        paternal Aunts   Heart disease Paternal Aunt    Thyroid  disease Neg Hx    Social History   Occupational History   Occupation: retired  Tobacco Use   Smoking status: Former  Current packs/day: 0.00    Types: Cigarettes    Quit date: 2019    Years since quitting: 6.8   Smokeless tobacco: Never   Tobacco comments:       Vaping Use   Vaping status: Never Used  Substance and Sexual Activity   Alcohol use: No    Alcohol/week: 0.0 standard drinks of alcohol   Drug use: Not Currently    Types: Marijuana, Other-see comments    Comment:  and CBD oil  last use of both was 10/29/19   Sexual activity: Not Currently   Tobacco Counseling Counseling given: Not Answered Tobacco comments:    SDOH Screenings   Food Insecurity: No Food Insecurity (07/10/2024)  Housing: Unknown (07/10/2024)  Transportation Needs: No Transportation Needs (07/10/2024)  Utilities: Not At Risk (07/10/2024)  Alcohol Screen: Low Risk  (07/15/2021)  Depression (PHQ2-9): Low Risk  (07/10/2024)  Financial Resource Strain: Low Risk  (07/15/2021)  Physical Activity: Inactive (07/10/2024)  Social Connections: Socially Isolated (07/10/2024)  Stress: No Stress Concern Present (07/10/2024)  Tobacco Use: Medium Risk  (07/10/2024)  Health Literacy: Adequate Health Literacy (07/10/2024)   See flowsheets for full screening details  Depression Screen PHQ 2 & 9 Depression Scale- Over the past 2 weeks, how often have you been bothered by any of the following problems? Little interest or pleasure in doing things: 0 Feeling down, depressed, or hopeless (PHQ Adolescent also includes...irritable): 0 PHQ-2 Total Score: 0 Trouble falling or staying asleep, or sleeping too much: 0 Feeling tired or having little energy: 0 Poor appetite or overeating (PHQ Adolescent also includes...weight loss): 0 Feeling bad about yourself - or that you are a failure or have let yourself or your family down: 0 Trouble concentrating on things, such as reading the newspaper or watching television (PHQ Adolescent also includes...like school work): 0 Moving or speaking so slowly that other people could have noticed. Or the opposite - being so fidgety or restless that you have been moving around a lot more than usual: 0 Thoughts that you would be better off dead, or of hurting yourself in some way: 0 PHQ-9 Total Score: 0 If you checked off any problems, how difficult have these problems made it for you to do your work, take care of things at home, or get along with other people?: Not difficult at all  Depression Treatment Depression Interventions/Treatment : EYV7-0 Score <4 Follow-up Not Indicated     Goals Addressed               This Visit's Progress     Patient Stated (pt-stated)        Patient stated she plans to continue to eat healthy       Visit info / Clinical Intake: Medicare Wellness Visit Type:: Subsequent Annual Wellness Visit Persons participating in visit:: patient Medicare Wellness Visit Mode:: Telephone If telephone:: video declined Because this visit was a virtual/telehealth visit:: vitals recorded from last visit If Telephone or Video please confirm:: I connected with the patient using audio enabled  telemedicine application and verified that I am speaking with the correct person using two identifiers; I discussed the limitations of evaluation and management by telemedicine; The patient expressed understanding and agreed to proceed Patient Location:: Home Provider Location:: Office Information given by:: patient Interpreter Needed?: No Pre-visit prep was completed: yes AWV questionnaire completed by patient prior to visit?: no Living arrangements:: (!) lives alone Patient's Overall Health Status Rating: (!) fair Typical amount of pain: none Does pain affect daily life?:  no Are you currently prescribed opioids?: no  Dietary Habits and Nutritional Risks How many meals a day?: 3 Eats fruit and vegetables daily?: yes Most meals are obtained by: having others provide food In the last 2 weeks, have you had any of the following?: none Diabetic:: (!) yes Any non-healing wounds?: no How often do you check your BS?: 0 Would you like to be referred to a Nutritionist or for Diabetic Management? : no  Functional Status Activities of Daily Living (to include ambulation/medication): Independent Ambulation: Independent with device- listed below Home Assistive Devices/Equipment: Eyeglasses; Dentures (specify type); Wheelchair; Hospital bed Medication Administration: Dependent (Caregiver) Is this a change from baseline?: Change from baseline, expected to last >3 days Home Management: Dependent (Caregiver/Grandchildren) Manage your own finances?: yes (Grandchildren helps) Primary transportation is: family/friends; facility / other Concerns about vision?: no *vision screening is required for WTM* Concerns about hearing?: no  Fall Screening Falls in the past year?: 0 Number of falls in past year: 0 Was there an injury with Fall?: 0 Fall Risk Category Calculator: 0 Patient Fall Risk Level: Low Fall Risk  Fall Risk Patient at Risk for Falls Due to: No Fall Risks Fall risk Follow up: Falls  evaluation completed; Falls prevention discussed  Home and Transportation Safety: All rugs have non-skid backing?: yes All stairs or steps have railings?: (!) no Grab bars in the bathtub or shower?: (!) no Have non-skid surface in bathtub or shower?: yes Good home lighting?: yes Regular seat belt use?: yes Hospital stays in the last year:: (!) no; yes How many hospital stays:: 2 Reason: stroke  Cognitive Assessment Difficulty concentrating, remembering, or making decisions? : no Will 6CIT or Mini Cog be Completed: no 6CIT or Mini Cog Declined: patient declined  Advance Directives (For Healthcare) Does Patient Have a Medical Advance Directive?: No Would patient like information on creating a medical advance directive?: No - Patient declined  Reviewed/Updated  Reviewed/Updated: Reviewed All (Medical, Surgical, Family, Medications, Allergies, Care Teams, Patient Goals)        Objective:    Today's Vitals   07/10/24 1406  Weight: 90 lb (40.8 kg)  Height: 5' 5 (1.651 m)   Body mass index is 14.98 kg/m.  Current Medications (verified) Outpatient Encounter Medications as of 07/10/2024  Medication Sig   acetaminophen  (TYLENOL ) 500 MG tablet Take 500-1,000 mg by mouth every 6 (six) hours as needed for mild pain (pain score 1-3) or moderate pain (pain score 4-6).   amLODipine  (NORVASC ) 5 MG tablet Take 1.5 tablets (7.5 mg total) by mouth daily.   apixaban  (ELIQUIS ) 5 MG TABS tablet Take 1 tablet (5 mg total) by mouth 2 (two) times daily.   diclofenac  Sodium (VOLTAREN ) 1 % GEL Apply 4 g topically 4 (four) times daily.   fluticasone  (FLONASE ) 50 MCG/ACT nasal spray Place 2 sprays into both nostrils daily.   nitroGLYCERIN  (NITROSTAT ) 0.4 MG SL tablet Place 1 tablet (0.4 mg total) under the tongue every 5 (five) minutes as needed for chest pain.   polyethylene glycol (MIRALAX  / GLYCOLAX ) 17 g packet Take 17 g by mouth daily.   rosuvastatin  (CRESTOR ) 40 MG tablet Take 1 tablet (40  mg total) by mouth daily.   ezetimibe  (ZETIA ) 10 MG tablet Take 1 tablet (10 mg total) by mouth daily. (Patient not taking: Reported on 07/10/2024)   senna-docusate (SENOKOT-S) 8.6-50 MG tablet Take 1 tablet by mouth at bedtime as needed for mild constipation. (Patient not taking: Reported on 07/10/2024)   No facility-administered encounter  medications on file as of 07/10/2024.   Hearing/Vision screen Hearing Screening - Comments:: Denies hearing difficulties   Vision Screening - Comments:: Wears rx glasses - family plans to schedule an appt w/an Optometrist Immunizations and Health Maintenance Health Maintenance  Topic Date Due   FOOT EXAM  Never done   Diabetic kidney evaluation - Urine ACR  Never done   Mammogram  Never done   DEXA SCAN  Never done   COVID-19 Vaccine (1 - 2025-26 season) Never done   HEMOGLOBIN A1C  07/04/2024   OPHTHALMOLOGY EXAM  07/10/2024 (Originally 07/28/2012)   Zoster Vaccines- Shingrix (1 of 2) 10/10/2024 (Originally 01/22/2004)   DTaP/Tdap/Td (3 - Td or Tdap) 07/10/2025 (Originally 08/29/2023)   Pneumococcal Vaccine: 50+ Years (1 of 2 - PCV) 07/10/2025 (Originally 01/21/1973)   Colonoscopy  07/10/2025 (Originally 01/22/1999)   Diabetic kidney evaluation - eGFR measurement  03/19/2025   Medicare Annual Wellness (AWV)  07/10/2025   Hepatitis C Screening  Completed   Meningococcal B Vaccine  Aged Out   Influenza Vaccine  Discontinued        Assessment/Plan:  This is a routine wellness examination for [place].  Patient Care Team: Geofm Glade PARAS, MD as PCP - General (Internal Medicine) Pietro Redell RAMAN, MD as PCP - Cardiology (Cardiology) Kelsie Agent, MD (Inactive) as PCP - Electrophysiology (Cardiology) Pietro Redell RAMAN, MD (Cardiology) Harvey Carlin BRAVO, MD (Inactive) (Vascular Surgery) Rodgers Dorothyann BRAVO, OT as Occupational Therapist (Occupational Therapy) Szabat, Toribio BROCKS, [facility] (Inactive) as Pharmacist (Pharmacist) Sherren Olam BRAVO, LCSW as Social  Worker (Licensed Clinical Social Worker)  I have personally reviewed and noted the following in the patient's chart:   Medical and social history Use of alcohol, tobacco or illicit drugs  Current medications and supplements including opioid prescriptions. Functional ability and status Nutritional status Physical activity Advanced directives List of other physicians Hospitalizations, surgeries, and ER visits in previous 12 months Vitals Screenings to include cognitive, depression, and falls Referrals and appointments  Orders Placed This Encounter  Procedures   DG Bone Density    Standing Status:   Future    Expiration Date:   07/10/2025    Reason for Exam (SYMPTOM  OR DIAGNOSIS REQUIRED):   post menopausal estrogen deficient    Preferred imaging location?:   [facility]   MM 3D SCREENING MAMMOGRAM BILATERAL BREAST    Standing Status:   Future    Expiration Date:   07/10/2025    Reason for Exam (SYMPTOM  OR DIAGNOSIS REQUIRED):   screening for breast cancer    Preferred imaging location?:   [facility]   In addition, I have reviewed and discussed with patient certain preventive protocols, quality metrics, and best practice recommendations. A written personalized care plan for preventive services as well as general preventive health recommendations were provided to patient.   Verdie CHRISTELLA Saba, CMA   07/10/2024   Return in 1 year (on 07/10/2025).  After Visit Summary: (Declined) Due to this being a telephonic visit, with patients personalized plan was offered to patient but patient Declined AVS at this time   Nurse Notes:    Medical screening examination/treatment/procedure(s) were performed by non-physician practitioner and as supervising physician I was immediately available for consultation/collaboration.  I agree with above. Karlynn Noel, MD

## 2024-07-16 ENCOUNTER — Other Ambulatory Visit: Payer: Self-pay | Admitting: Internal Medicine

## 2024-07-30 NOTE — Telephone Encounter (Unsigned)
 Copied from CRM [id number]. Topic: Referral - Request for Referral >> Jul 30, 2024 12:41 PM Larissa S wrote: Did the patient discuss referral with their provider in the last year? Yes (If No - schedule appointment) (If Yes - send message)  Appointment offered? No  Type of order/referral and detailed reason for visit: In- home PT services  Preference of office, provider, location: Open   If referral order, have you been seen by this specialty before?  (If Yes, this issue or another issue? When? Where?  Can we respond through MyChart? No

## 2024-07-31 NOTE — Telephone Encounter (Signed)
 Message left for patient.  She needs to schedule appointment for PT to be ordered.  Okay to move appointment up when she calls back.

## 2024-07-31 NOTE — Telephone Encounter (Signed)
 I have ordered this in the last few months and was doing she has been getting therapy at home.  At this point I cannot order it because she has not been seen in the past 2 months.  She can either wait until she comes in next month or come in sooner for a face-to-face visit.

## 2024-08-27 ENCOUNTER — Other Ambulatory Visit: Payer: Self-pay

## 2024-08-27 NOTE — Patient Outreach (Signed)
 LCSW made a courtesy call to pt in order to alert patient that services would be closed out. LCSW was unable to reach pt or leave voicemail. Per last visit pt was no longer seeking inpt rehab services. LCSW (Mercedes [name]) also had visit with pt and it was confirmed that she was seeking in home services at that time. Per notes review it appears that pt was receiving in home therapy. Patient is being closed out of [facility] services. If a need arises please reach out.  Olam Ally, MSW, LCSW [place]  [facility] Licensed Clinical Social Worker Direct Dial: [phone]

## 2024-09-19 ENCOUNTER — Ambulatory Visit: Admitting: Internal Medicine

## 2024-09-25 ENCOUNTER — Encounter: Payer: Self-pay | Admitting: Internal Medicine

## 2024-09-25 NOTE — Progress Notes (Signed)
" ° ° ° ° °  Subjective:    Patient ID: Mercedes [name], female    DOB: 1956-06-10, 69 y.o.   MRN: [id number]     HPI Mercedes [name] is here for follow up of her chronic medical problems.    Medications and allergies reviewed with patient and updated if appropriate.  Medications Ordered Prior to Encounter[1]   Review of Systems     Objective:  There were no vitals filed for this visit. BP Readings from Last 3 Encounters:  03/19/24 138/80  01/02/24 (!) 144/86  09/24/23 134/80   Wt Readings from Last 3 Encounters:  07/10/24 90 lb (40.8 kg)  09/12/23 90 lb 6.2 oz (41 kg)  05/31/23 90 lb 6.2 oz (41 kg)   There is no height or weight on file to calculate BMI.    Physical Exam     Lab Results  Component Value Date   WBC 4.9 01/02/2024   HGB 12.9 01/02/2024   HCT 40.8 01/02/2024   PLT 451.0 (H) 01/02/2024   GLUCOSE 98 03/19/2024   CHOL 111 03/19/2024   TRIG 105.0 03/19/2024   HDL 52.80 03/19/2024   LDLCALC 37 03/19/2024   ALT 37 (H) 03/19/2024   AST 43 (H) 03/19/2024   NA 137 03/19/2024   K 4.1 03/19/2024   CL 103 03/19/2024   CREATININE 0.65 03/19/2024   BUN 14 03/19/2024   CO2 26 03/19/2024   TSH 0.27 (L) 03/19/2024   INR 1.1 09/12/2023   HGBA1C 5.7 01/02/2024     Assessment & Plan:    See Problem List for Assessment and Plan of chronic medical problems.        [1] Current Outpatient Medications on File Prior to Visit  Medication Sig Dispense Refill   acetaminophen  (TYLENOL ) 500 MG tablet Take 500-1,000 mg by mouth every 6 (six) hours as needed for mild pain (pain score 1-3) or moderate pain (pain score 4-6).     amLODipine  (NORVASC ) 5 MG tablet Take 1.5 tablets (7.5 mg total) by mouth daily. 135 tablet 1   apixaban  (ELIQUIS ) 5 MG TABS tablet Take 1 tablet (5 mg total) by mouth 2 (two) times daily. 180 tablet 1   diclofenac  Sodium (VOLTAREN ) 1 % GEL Apply 4 g topically 4 (four) times daily. 100 g 5   ezetimibe  (ZETIA ) 10 MG tablet TAKE 1 TABLET(10  MG) BY MOUTH DAILY 90 tablet 1   fluticasone  (FLONASE ) 50 MCG/ACT nasal spray Place 2 sprays into both nostrils daily. 16 g 2   nitroGLYCERIN  (NITROSTAT ) 0.4 MG SL tablet Place 1 tablet (0.4 mg total) under the tongue every 5 (five) minutes as needed for chest pain. 25 tablet 11   polyethylene glycol (MIRALAX  / GLYCOLAX ) 17 g packet Take 17 g by mouth daily. 14 each 0   rosuvastatin  (CRESTOR ) 40 MG tablet TAKE 1 TABLET(40 MG) BY MOUTH DAILY 90 tablet 1   No current facility-administered medications on file prior to visit.  This encounter was created in error - please disregard. "

## 2024-09-25 NOTE — Patient Instructions (Addendum)
" ° ° ° ° °  Blood work was ordered.       Medications changes include :   None    A referral was ordered and someone will call you to schedule an appointment.     Return in about 6 months (around 03/26/2025) for follow up.  "

## 2024-09-26 ENCOUNTER — Encounter: Admitting: Internal Medicine

## 2024-09-26 DIAGNOSIS — Z89611 Acquired absence of right leg above knee: Secondary | ICD-10-CM

## 2024-09-26 DIAGNOSIS — I69391 Dysphagia following cerebral infarction: Secondary | ICD-10-CM

## 2024-09-26 DIAGNOSIS — E1169 Type 2 diabetes mellitus with other specified complication: Secondary | ICD-10-CM

## 2024-09-26 DIAGNOSIS — I739 Peripheral vascular disease, unspecified: Secondary | ICD-10-CM

## 2024-09-26 DIAGNOSIS — I2510 Atherosclerotic heart disease of native coronary artery without angina pectoris: Secondary | ICD-10-CM

## 2024-09-26 DIAGNOSIS — I480 Paroxysmal atrial fibrillation: Secondary | ICD-10-CM

## 2024-09-26 DIAGNOSIS — I69354 Hemiplegia and hemiparesis following cerebral infarction affecting left non-dominant side: Secondary | ICD-10-CM

## 2024-09-26 DIAGNOSIS — I152 Hypertension secondary to endocrine disorders: Secondary | ICD-10-CM

## 2024-09-26 DIAGNOSIS — Z89612 Acquired absence of left leg above knee: Secondary | ICD-10-CM

## 2025-07-17 ENCOUNTER — Ambulatory Visit

## 2025-07-17 ENCOUNTER — Encounter: Admitting: Internal Medicine
